# Patient Record
Sex: Male | Born: 1955
Health system: Southern US, Community
[De-identification: ages and names within clinical notes are randomized; demographics above are authoritative.]

## PROBLEM LIST (undated history)

## (undated) DIAGNOSIS — M459 Ankylosing spondylitis of unspecified sites in spine: Secondary | ICD-10-CM

## (undated) DIAGNOSIS — C801 Malignant (primary) neoplasm, unspecified: Secondary | ICD-10-CM

## (undated) DIAGNOSIS — Z87442 Personal history of urinary calculi: Secondary | ICD-10-CM

## (undated) DIAGNOSIS — I509 Heart failure, unspecified: Secondary | ICD-10-CM

## (undated) DIAGNOSIS — I10 Essential (primary) hypertension: Secondary | ICD-10-CM

## (undated) DIAGNOSIS — M1990 Unspecified osteoarthritis, unspecified site: Secondary | ICD-10-CM

## (undated) DIAGNOSIS — I219 Acute myocardial infarction, unspecified: Secondary | ICD-10-CM

## (undated) DIAGNOSIS — I2510 Atherosclerotic heart disease of native coronary artery without angina pectoris: Secondary | ICD-10-CM

## (undated) DIAGNOSIS — Z923 Personal history of irradiation: Secondary | ICD-10-CM

## (undated) DIAGNOSIS — E785 Hyperlipidemia, unspecified: Secondary | ICD-10-CM

## (undated) DIAGNOSIS — F329 Major depressive disorder, single episode, unspecified: Secondary | ICD-10-CM

## (undated) DIAGNOSIS — R011 Cardiac murmur, unspecified: Secondary | ICD-10-CM

## (undated) DIAGNOSIS — F32A Depression, unspecified: Secondary | ICD-10-CM

## (undated) DIAGNOSIS — Z86010 Personal history of colonic polyps: Secondary | ICD-10-CM

## (undated) DIAGNOSIS — Z8719 Personal history of other diseases of the digestive system: Secondary | ICD-10-CM

## (undated) DIAGNOSIS — N400 Enlarged prostate without lower urinary tract symptoms: Secondary | ICD-10-CM

## (undated) HISTORY — DX: Benign prostatic hyperplasia without lower urinary tract symptoms: N40.0

## (undated) HISTORY — DX: Essential (primary) hypertension: I10

## (undated) HISTORY — DX: Personal history of colonic polyps: Z86.010

## (undated) HISTORY — DX: Atherosclerotic heart disease of native coronary artery without angina pectoris: I25.10

## (undated) HISTORY — DX: Personal history of urinary calculi: Z87.442

## (undated) HISTORY — DX: Ankylosing spondylitis of unspecified sites in spine: M45.9

## (undated) HISTORY — PX: TONSILLECTOMY: SUR1361

## (undated) HISTORY — DX: Hyperlipidemia, unspecified: E78.5

## (undated) HISTORY — PX: LAMINECTOMY: SHX219

## (undated) HISTORY — DX: Unspecified osteoarthritis, unspecified site: M19.90

---
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
## 2002-03-16 ENCOUNTER — Emergency Department (HOSPITAL_COMMUNITY): Admission: EM | Admit: 2002-03-16 | Discharge: 2002-03-16 | Payer: Self-pay | Admitting: Emergency Medicine

## 2002-03-19 ENCOUNTER — Emergency Department (HOSPITAL_COMMUNITY): Admission: EM | Admit: 2002-03-19 | Discharge: 2002-03-19 | Payer: Self-pay | Admitting: Emergency Medicine

## 2002-03-21 ENCOUNTER — Emergency Department (HOSPITAL_COMMUNITY): Admission: EM | Admit: 2002-03-21 | Discharge: 2002-03-21 | Payer: Self-pay | Admitting: Emergency Medicine

## 2002-07-13 ENCOUNTER — Encounter: Payer: Self-pay | Admitting: Internal Medicine

## 2002-07-13 ENCOUNTER — Ambulatory Visit (HOSPITAL_COMMUNITY): Admission: RE | Admit: 2002-07-13 | Discharge: 2002-07-13 | Payer: Self-pay | Admitting: Internal Medicine

## 2003-05-14 DIAGNOSIS — I219 Acute myocardial infarction, unspecified: Secondary | ICD-10-CM

## 2003-05-14 HISTORY — DX: Acute myocardial infarction, unspecified: I21.9

## 2003-05-14 HISTORY — PX: CORONARY ANGIOPLASTY WITH STENT PLACEMENT: SHX49

## 2003-07-20 ENCOUNTER — Ambulatory Visit (HOSPITAL_COMMUNITY): Admission: RE | Admit: 2003-07-20 | Discharge: 2003-07-20 | Payer: Self-pay | Admitting: Internal Medicine

## 2004-02-13 ENCOUNTER — Inpatient Hospital Stay (HOSPITAL_COMMUNITY): Admission: AD | Admit: 2004-02-13 | Discharge: 2004-02-15 | Payer: Self-pay | Admitting: Cardiology

## 2004-02-13 ENCOUNTER — Emergency Department (HOSPITAL_COMMUNITY): Admission: EM | Admit: 2004-02-13 | Discharge: 2004-02-13 | Payer: Self-pay | Admitting: Emergency Medicine

## 2004-03-05 ENCOUNTER — Encounter (HOSPITAL_COMMUNITY): Admission: RE | Admit: 2004-03-05 | Discharge: 2004-06-03 | Payer: Self-pay | Admitting: Cardiology

## 2004-03-30 ENCOUNTER — Ambulatory Visit: Payer: Self-pay | Admitting: Cardiology

## 2004-05-24 ENCOUNTER — Ambulatory Visit: Payer: Self-pay | Admitting: Cardiology

## 2004-06-05 ENCOUNTER — Encounter (HOSPITAL_COMMUNITY): Admission: RE | Admit: 2004-06-05 | Discharge: 2004-09-03 | Payer: Self-pay | Admitting: Cardiology

## 2004-08-02 ENCOUNTER — Ambulatory Visit: Payer: Self-pay | Admitting: Gastroenterology

## 2004-08-13 ENCOUNTER — Ambulatory Visit: Payer: Self-pay | Admitting: Gastroenterology

## 2004-10-25 ENCOUNTER — Ambulatory Visit: Payer: Self-pay | Admitting: Cardiology

## 2004-10-26 ENCOUNTER — Observation Stay (HOSPITAL_COMMUNITY): Admission: EM | Admit: 2004-10-26 | Discharge: 2004-10-26 | Payer: Self-pay | Admitting: Emergency Medicine

## 2004-11-01 ENCOUNTER — Ambulatory Visit: Payer: Self-pay

## 2004-12-04 ENCOUNTER — Ambulatory Visit: Payer: Self-pay | Admitting: Cardiology

## 2004-12-31 ENCOUNTER — Encounter: Payer: Self-pay | Admitting: Internal Medicine

## 2004-12-31 ENCOUNTER — Encounter: Admission: RE | Admit: 2004-12-31 | Discharge: 2004-12-31 | Payer: Self-pay | Admitting: Rheumatology

## 2005-01-29 ENCOUNTER — Encounter: Payer: Self-pay | Admitting: Internal Medicine

## 2005-05-13 LAB — HM COLONOSCOPY

## 2005-07-22 ENCOUNTER — Ambulatory Visit (HOSPITAL_COMMUNITY): Admission: RE | Admit: 2005-07-22 | Discharge: 2005-07-22 | Payer: Self-pay | Admitting: Internal Medicine

## 2005-07-30 ENCOUNTER — Ambulatory Visit: Payer: Self-pay | Admitting: Gastroenterology

## 2005-08-06 ENCOUNTER — Ambulatory Visit: Payer: Self-pay | Admitting: Gastroenterology

## 2005-08-06 ENCOUNTER — Encounter (INDEPENDENT_AMBULATORY_CARE_PROVIDER_SITE_OTHER): Payer: Self-pay | Admitting: *Deleted

## 2005-08-06 ENCOUNTER — Ambulatory Visit (HOSPITAL_COMMUNITY): Admission: RE | Admit: 2005-08-06 | Discharge: 2005-08-06 | Payer: Self-pay | Admitting: Gastroenterology

## 2006-04-18 ENCOUNTER — Emergency Department (HOSPITAL_COMMUNITY): Admission: EM | Admit: 2006-04-18 | Discharge: 2006-04-18 | Payer: Self-pay | Admitting: Emergency Medicine

## 2006-06-18 ENCOUNTER — Emergency Department (HOSPITAL_COMMUNITY): Admission: EM | Admit: 2006-06-18 | Discharge: 2006-06-18 | Payer: Self-pay | Admitting: Emergency Medicine

## 2007-05-22 ENCOUNTER — Encounter: Payer: Self-pay | Admitting: Internal Medicine

## 2007-05-22 ENCOUNTER — Encounter: Admission: RE | Admit: 2007-05-22 | Discharge: 2007-05-22 | Payer: Self-pay | Admitting: Rheumatology

## 2007-06-26 ENCOUNTER — Ambulatory Visit (HOSPITAL_COMMUNITY): Admission: RE | Admit: 2007-06-26 | Discharge: 2007-06-27 | Payer: Self-pay | Admitting: Medical Oncology

## 2007-07-24 ENCOUNTER — Encounter: Payer: Self-pay | Admitting: Internal Medicine

## 2008-06-07 ENCOUNTER — Ambulatory Visit: Payer: Self-pay | Admitting: Internal Medicine

## 2008-06-07 DIAGNOSIS — I10 Essential (primary) hypertension: Secondary | ICD-10-CM | POA: Insufficient documentation

## 2008-06-07 DIAGNOSIS — Z87442 Personal history of urinary calculi: Secondary | ICD-10-CM

## 2008-06-07 DIAGNOSIS — Z860100 Personal history of colon polyps, unspecified: Secondary | ICD-10-CM

## 2008-06-07 DIAGNOSIS — E785 Hyperlipidemia, unspecified: Secondary | ICD-10-CM | POA: Insufficient documentation

## 2008-06-07 DIAGNOSIS — N400 Enlarged prostate without lower urinary tract symptoms: Secondary | ICD-10-CM | POA: Insufficient documentation

## 2008-06-07 DIAGNOSIS — I2510 Atherosclerotic heart disease of native coronary artery without angina pectoris: Secondary | ICD-10-CM | POA: Insufficient documentation

## 2008-06-07 HISTORY — DX: Essential (primary) hypertension: I10

## 2008-06-07 HISTORY — DX: Personal history of colon polyps, unspecified: Z86.0100

## 2008-06-07 HISTORY — DX: Hyperlipidemia, unspecified: E78.5

## 2008-06-07 HISTORY — DX: Benign prostatic hyperplasia without lower urinary tract symptoms: N40.0

## 2008-06-07 HISTORY — DX: Personal history of urinary calculi: Z87.442

## 2008-06-07 HISTORY — DX: Personal history of colonic polyps: Z86.010

## 2008-07-26 ENCOUNTER — Encounter: Payer: Self-pay | Admitting: Internal Medicine

## 2008-10-04 ENCOUNTER — Ambulatory Visit: Payer: Self-pay | Admitting: Internal Medicine

## 2008-12-19 ENCOUNTER — Telehealth (INDEPENDENT_AMBULATORY_CARE_PROVIDER_SITE_OTHER): Payer: Self-pay | Admitting: *Deleted

## 2009-01-24 ENCOUNTER — Encounter: Payer: Self-pay | Admitting: Internal Medicine

## 2009-03-13 ENCOUNTER — Ambulatory Visit: Payer: Self-pay | Admitting: Internal Medicine

## 2009-03-13 DIAGNOSIS — F17200 Nicotine dependence, unspecified, uncomplicated: Secondary | ICD-10-CM | POA: Insufficient documentation

## 2009-03-13 LAB — CONVERTED CEMR LAB
ALT: 24 units/L (ref 0–53)
AST: 25 units/L (ref 0–37)
Albumin: 4.4 g/dL (ref 3.5–5.2)
Alkaline Phosphatase: 95 units/L (ref 39–117)
BUN: 15 mg/dL (ref 6–23)
Basophils Relative: 0.9 % (ref 0.0–3.0)
Bilirubin, Direct: 0 mg/dL (ref 0.0–0.3)
CO2: 31 meq/L (ref 19–32)
Calcium: 9.1 mg/dL (ref 8.4–10.5)
Chloride: 105 meq/L (ref 96–112)
Cholesterol: 151 mg/dL (ref 0–200)
Creatinine, Ser: 1 mg/dL (ref 0.4–1.5)
Eosinophils Relative: 1.7 % (ref 0.0–5.0)
GFR calc non Af Amer: 82.31 mL/min (ref >60)
Glucose, Bld: 124 mg/dL — ABNORMAL HIGH (ref 70–99)
HCT: 49.6 % (ref 39.0–52.0)
HDL: 53.3 mg/dL (ref >39.00)
Hemoglobin: 16.7 g/dL (ref 13.0–17.0)
LDL Cholesterol: 84 mg/dL (ref 0–99)
Lymphocytes Relative: 8.4 % — ABNORMAL LOW (ref 12.0–46.0)
MCHC: 33.6 g/dL (ref 30.0–36.0)
MCV: 96 fL (ref 78.0–100.0)
Monocytes Relative: 6.1 % (ref 3.0–12.0)
Neutrophils Relative %: 82.9 % — ABNORMAL HIGH (ref 43.0–77.0)
PSA: 0.72 ng/mL (ref 0.10–4.00)
Platelets: 239 10*3/uL (ref 150.0–400.0)
Potassium: 4.4 meq/L (ref 3.5–5.1)
RBC: 5.17 M/uL (ref 4.22–5.81)
RDW: 13.5 % (ref 11.5–14.6)
Sodium: 141 meq/L (ref 135–145)
TSH: 0.72 microintl units/mL (ref 0.35–5.50)
Total Bilirubin: 0.9 mg/dL (ref 0.3–1.2)
Total CHOL/HDL Ratio: 3
Total Protein: 7.5 g/dL (ref 6.0–8.3)
Triglycerides: 67 mg/dL (ref 0.0–149.0)
VLDL: 13.4 mg/dL (ref 0.0–40.0)
WBC: 13.3 10*3/uL — ABNORMAL HIGH (ref 4.5–10.5)

## 2009-07-19 ENCOUNTER — Encounter: Payer: Self-pay | Admitting: Internal Medicine

## 2009-09-11 ENCOUNTER — Ambulatory Visit: Payer: Self-pay | Admitting: Internal Medicine

## 2010-01-22 ENCOUNTER — Encounter: Payer: Self-pay | Admitting: Internal Medicine

## 2010-03-05 ENCOUNTER — Encounter: Admission: RE | Admit: 2010-03-05 | Discharge: 2010-03-05 | Payer: Self-pay | Admitting: Neurosurgery

## 2010-03-12 ENCOUNTER — Ambulatory Visit: Payer: Self-pay | Admitting: Internal Medicine

## 2010-04-04 ENCOUNTER — Encounter: Payer: Self-pay | Admitting: Internal Medicine

## 2010-06-12 NOTE — Letter (Signed)
Summary: Rheumatology-Dr. Kellie Simmering  Rheumatology-Dr. Kellie Simmering   Imported By: Maryln Gottron 08/03/2008 14:56:47  _____________________________________________________________________  External Attachment:    Type:   Image     Comment:   External Document

## 2010-06-12 NOTE — Letter (Signed)
Summary: Rheumatology-Dr. Stacey Drain  Rheumatology-Dr. Stacey Drain   Imported By: Maryln Gottron 01/30/2010 14:07:44  _____________________________________________________________________  External Attachment:    Type:   Image     Comment:   External Document

## 2010-06-12 NOTE — Letter (Signed)
Summary: Rheumatology-Dr. Kellie Simmering  Rheumatology-Dr. Kellie Simmering   Imported By: Maryln Gottron 08/03/2008 14:53:26  _____________________________________________________________________  External Attachment:    Type:   Image     Comment:   External Document

## 2010-06-12 NOTE — Progress Notes (Signed)
Summary: Insurance will not cover Benicar  Phone Note Call from Patient   Summary of Call: Patient's insurance will not cover Benicar HCT 40-12.5. Preferred drugs are Micardis,Cozaar or Hyzaar. Initial call taken by: Darra Lis RMA,  December 19, 2008 10:21 AM  Follow-up for Phone Call        micardis 80/12.5  #90 one daily  RF 3 Follow-up by: Gordy Savers  MD,  December 19, 2008 11:10 AM  Additional Follow-up for Phone Call Additional follow up Details #1::        Patient informed. Additional Follow-up by: Darra Lis RMA,  December 19, 2008 12:24 PM    New/Updated Medications: MICARDIS HCT 80-12.5 MG TABS (TELMISARTAN-HCTZ) Take 1 tablet by mouth once a day Prescriptions: MICARDIS HCT 80-12.5 MG TABS (TELMISARTAN-HCTZ) Take 1 tablet by mouth once a day  #90 x 3   Entered by:   Darra Lis RMA   Authorized by:   Gordy Savers  MD   Signed by:   Darra Lis RMA on 12/19/2008   Method used:   Electronically to        [facility]  [address] # [id number]* (retail)       [address]       [place], [place]  [address]       Ph: [phone] or [phone]       Fax: [phone]   RxID:   [id number]

## 2010-06-12 NOTE — Letter (Signed)
Summary: [facility]  [facility]   Imported By: Maryln Gottron 04/18/2010 14:04:10  _____________________________________________________________________  External Attachment:    Type:   Image     Comment:   External Document

## 2010-06-12 NOTE — Assessment & Plan Note (Signed)
Summary: 6 month rov/njr   Vital Signs:  Patient profile:   55 year old male Weight:      189 pounds Temp:     98.4 degrees F oral BP sitting:   124 / 80  (right arm) Cuff size:   regular  Vitals Entered By: Duard Brady LPN (Sep 12, 6043 8:01 AM) CC: rov - doing well Is Patient Diabetic? No   CC:  rov - doing well.  History of Present Illness: 55 year old patient who is included for follow-up.  He has clear ardor disease, which has been stable.  He has regular workouts at his gym and denies any exertional symptoms.  Unfortunately, he has resumed smoking after aperitive success with Chand 6.  He has dyslipidemia, controlled on Lipitor, which he continues to tolerate well.  He is treated hypertension.  He also is a history of ankylosing spondylitis, which has been stable.  No new concerns or complaints.  He has colonic polyps and is due for a follow-up colonoscopy in 2010/10/23  Preventive Screening-Counseling & Management  Alcohol-Tobacco     Smoking Cessation Counseling: yes  Allergies (verified): No Known Drug Allergies  Past History:  Past Medical History: Reviewed history from 10/04/2008 and no changes required. Colonic polyps, hx of Coronary artery disease Hyperlipidemia Hypertension ankylosing spondylitis/ iritis cervical disk disease Nephrolithiasis, hx of tobacco use Benign prostatic hypertrophy  Past Surgical History: Reviewed history from 06/07/2008 and no changes required. angioplasty with stenting 2003-10-23 Lumbar laminectomy 10/23/07  Colonoscopy 10/22/2005  Family History: Reviewed history from 06/07/2008 and no changes required. father age 70, history of depression-deceased 10/22/2009 mother died age 69, complications of COPD  Three brothers, one deceased from a suicide death one sister with RA  Social History: Reviewed history from 06/07/2008 and no changes required. Single Current Smoker Regular exercise-yes  Review of Systems  The patient denies  anorexia, fever, weight loss, weight gain, vision loss, decreased hearing, hoarseness, chest pain, syncope, dyspnea on exertion, peripheral edema, prolonged cough, headaches, hemoptysis, abdominal pain, melena, hematochezia, severe indigestion/heartburn, hematuria, incontinence, genital sores, muscle weakness, suspicious skin lesions, transient blindness, difficulty walking, depression, unusual weight change, abnormal bleeding, enlarged lymph nodes, angioedema, breast masses, and testicular masses.    Physical Exam  General:  Well-developed,well-nourished,in no acute distress; alert,appropriate and cooperative throughout examination Head:  Normocephalic and atraumatic without obvious abnormalities. No apparent alopecia or balding. Eyes:  No corneal or conjunctival inflammation noted. EOMI. Perrla. Funduscopic exam benign, without hemorrhages, exudates or papilledema. Vision grossly normal. Mouth:  Oral mucosa and oropharynx without lesions or exudates.  Teeth in good repair. Neck:  No deformities, masses, or tenderness noted. Lungs:  Normal respiratory effort, chest expands symmetrically. Lungs are clear to auscultation, no crackles or wheezes. Heart:  Normal rate and regular rhythm. S1 and S2 normal without gallop, murmur, click, rub or other extra sounds. Abdomen:  Bowel sounds positive,abdomen soft and non-tender without masses, organomegaly or hernias noted. Msk:  No deformity or scoliosis noted of thoracic or lumbar spine.   Pulses:  R and L carotid,radial,femoral,dorsalis pedis and posterior tibial pulses are full and equal bilaterally Extremities:  No clubbing, cyanosis, edema, or deformity noted with normal full range of motion of all joints.     Impression & Recommendations:  Problem # 1:  TOBACCO ABUSE (ICD-305.1)  His updated medication list for this problem includes:    Chantix Starting Month Pak 0.5 Mg X 11 & 1 Mg X 42 Tabs (Varenicline tartrate) .[name]... As directed  Chantix 1 Mg  Tabs (Varenicline tartrate) ..... One twice daily  His updated medication list for this problem includes:    Chantix Starting Month Pak 0.5 Mg X 11 & 1 Mg X 42 Tabs (Varenicline tartrate) .[name]... As directed    Chantix 1 Mg Tabs (Varenicline tartrate) ..... One twice daily  Orders: Prescription Created Electronically [phone])  Problem # 2:  HYPERTENSION (ICD-401.9)  His updated medication list for this problem includes:    Atenolol 50 Mg Tabs (Atenolol) .[name]... 1 once daily    Micardis Hct 80-12.5 Mg Tabs (Telmisartan-hctz) .[name]... Take 1 tablet by mouth once a day  His updated medication list for this problem includes:    Atenolol 50 Mg Tabs (Atenolol) .[name]... 1 once daily    Micardis Hct 80-12.5 Mg Tabs (Telmisartan-hctz) .[name]... Take 1 tablet by mouth once a day  Problem # 3:  HYPERLIPIDEMIA (ICD-272.4)  His updated medication list for this problem includes:    Lipitor 80 Mg Tabs (Atorvastatin calcium) .[name]... 1 once daily  His updated medication list for this problem includes:    Lipitor 80 Mg Tabs (Atorvastatin calcium) .[name]... 1 once daily  Problem # 4:  CORONARY ARTERY DISEASE (ICD-414.00)  His updated medication list for this problem includes:    Plavix 75 Mg Tabs (Clopidogrel bisulfate) .[name]... 1 once daily    Atenolol 50 Mg Tabs (Atenolol) .[name]... 1 once daily    Micardis Hct 80-12.5 Mg Tabs (Telmisartan-hctz) .[name]... Take 1 tablet by mouth once a day  His updated medication list for this problem includes:    Plavix 75 Mg Tabs (Clopidogrel bisulfate) .[name]... 1 once daily    Atenolol 50 Mg Tabs (Atenolol) .[name]... 1 once daily    Micardis Hct 80-12.5 Mg Tabs (Telmisartan-hctz) .[name]... Take 1 tablet by mouth once a day  Complete Medication List: 1)  Lipitor 80 Mg Tabs (Atorvastatin calcium) .[name].. 1 once daily 2)  Plavix 75 Mg Tabs (Clopidogrel bisulfate) .[name].. 1 once daily 3)  Flomax 0.4 Mg Xr24h-cap (Tamsulosin hcl) .[name].. 1 once daily 4)  Atenolol 50 Mg Tabs (Atenolol) .[name].. 1 once daily 5)   Enbrel 50 Mg/ml Soln (Etanercept) .... Q week 6)  Chantix Starting Month Pak 0.5 Mg X 11 & 1 Mg X 42 Tabs (Varenicline tartrate) .... As directed 7)  Chantix 1 Mg Tabs (Varenicline tartrate) .... One twice daily 8)  Viagra 50 Mg Tabs (Sildenafil citrate) .... As directed 9)  Micardis Hct 80-12.5 Mg Tabs (Telmisartan-hctz) .... Take 1 tablet by mouth once a day  Patient Instructions: 1)  Please schedule a follow-up appointment in 6 months. 2)  Limit your Sodium (Salt) to less than 2 grams a day(slightly less than 1/2 a teaspoon) to prevent fluid retention, swelling, or worsening of symptoms. 3)  Tobacco is very bad for your health and your loved ones! You Should stop smoking!. 4)  It is important that you exercise regularly at least 20 minutes 5 times a week. If you develop chest pain, have severe difficulty breathing, or feel very tired , stop exercising immediately and seek medical attention. 5)  Check your Blood Pressure regularly. If it is above: 150/90  you should make an appointment. Prescriptions: CHANTIX 1 MG TABS (VARENICLINE TARTRATE) one twice daily  #60 x 2   Entered and Authorized by:   Gordy Savers  MD   Signed by:   Gordy Savers  MD on 09/11/2009   Method used:   Electronically to  [facility]  [address] # [id number]* (retail)       [address]       [place], [place]  [address]       Ph: [phone] or [phone]       Fax: [phone]   RxID:   [id number]

## 2010-06-12 NOTE — Letter (Signed)
Summary: Rheumatology-Dr. Stacey Drain  Rheumatology-Dr. Stacey Drain   Imported By: Maryln Gottron 07/26/2009 12:59:19  _____________________________________________________________________  External Attachment:    Type:   Image     Comment:   External Document

## 2010-06-12 NOTE — Assessment & Plan Note (Signed)
Summary: 6 MNTH ROV//SLM   Vital Signs:  Patient profile:   55 year old male Weight:      193 pounds BMI:     26.27 Temp:     98.1 degrees F oral BP sitting:   122 / 80  (left arm) Cuff size:   regular  Vitals Entered By: Alfred Levins, CMA (March 12, 2010 8:00 AM) CC: f/u, not fasting   CC:  f/u and not fasting.  History of Present Illness: 55 -year-old patient who is in today for follow-up.He has a history of coronary artery disease, hypertension, and dyslipidemia.  He has a history of ongoing tobacco use.  He has tried Chantix which was not successful, and caused vivid dreams.  His cardiac status is stable.  He continues to go to the gym 4 to 5 times per week. At the present time.  He has seen rheumatology as well as orthopedics for right hip pain.  When he exercises he has some paresthesias down to the right foot.  He is scheduled for epidurals in the near future.  He also has a history of colonic polyps. He wishes to consider  other pharmacologic considerations to assist with his tobacco addiction  Preventive Screening-Counseling & Management  Alcohol-Tobacco     Smoking Cessation Counseling: yes  Current Medications (verified): 1)  Lipitor 80 Mg Tabs (Atorvastatin Calcium) .[name].. 1 Once Daily 2)  Plavix 75 Mg Tabs (Clopidogrel Bisulfate) .[name].. 1 Once Daily 3)  Flomax 0.4 Mg Xr24h-Cap (Tamsulosin Hcl) .[name].. 1 Once Daily 4)  Atenolol 50 Mg Tabs (Atenolol) .[name].. 1 Once Daily 5)  Enbrel 50 Mg/ml Soln (Etanercept) .... Q Week 6)  Viagra 50 Mg Tabs (Sildenafil Citrate) .... As Directed 7)  Micardis Hct 80-12.5 Mg Tabs (Telmisartan-Hctz) .... Take 1 Tablet By Mouth Once A Day  Allergies (verified): No Known Drug Allergies  Past History:  Past Medical History: Reviewed history from 10/04/2008 and no changes required. Colonic polyps, hx of Coronary artery disease Hyperlipidemia Hypertension ankylosing spondylitis/ iritis cervical disk disease Nephrolithiasis, hx  of tobacco use Benign prostatic hypertrophy  Past Surgical History: Reviewed history from 06/07/2008 and no changes required. angioplasty with stenting 2005 Lumbar laminectomy 2009  Colonoscopy 2007  Review of Systems  The patient denies anorexia, fever, weight loss, weight gain, vision loss, decreased hearing, hoarseness, chest pain, syncope, dyspnea on exertion, peripheral edema, prolonged cough, headaches, hemoptysis, abdominal pain, melena, hematochezia, severe indigestion/heartburn, hematuria, incontinence, genital sores, muscle weakness, suspicious skin lesions, transient blindness, difficulty walking, depression, unusual weight change, abnormal bleeding, enlarged lymph nodes, angioedema, breast masses, and testicular masses.    Physical Exam  General:  Well-developed,well-nourished,in no acute distress; alert,appropriate and cooperative throughout examination Head:  Normocephalic and atraumatic without obvious abnormalities. No apparent alopecia or balding. Eyes:  No corneal or conjunctival inflammation noted. EOMI. Perrla. Funduscopic exam benign, without hemorrhages, exudates or papilledema. Vision grossly normal. Mouth:  Oral mucosa and oropharynx without lesions or exudates.  Teeth in good repair. Neck:  No deformities, masses, or tenderness noted. Lungs:  Normal respiratory effort, chest expands symmetrically. Lungs are clear to auscultation, no crackles or wheezes. Heart:  Normal rate and regular rhythm. S1 and S2 normal without gallop, murmur, click, rub or other extra sounds. Abdomen:  Bowel sounds positive,abdomen soft and non-tender without masses, organomegaly or hernias noted. Msk:  No deformity or scoliosis noted of thoracic or lumbar spine.   Pulses:  R and L carotid,radial,femoral,dorsalis pedis and posterior tibial pulses are full and  equal bilaterally Extremities:  No clubbing, cyanosis, edema, or deformity noted with normal full range of motion of all joints.     Skin:  Intact without suspicious lesions or rashes Cervical Nodes:  No lymphadenopathy noted Axillary Nodes:  No palpable lymphadenopathy   Impression & Recommendations:  Problem # 1:  TOBACCO ABUSE (ICD-305.1)  The following medications were removed from the medication list:    Chantix Starting Month Pak 0.5 Mg X 11 & 1 Mg X 42 Tabs (Varenicline tartrate) .[name]... As directed    Chantix 1 Mg Tabs (Varenicline tartrate) ..... One twice daily  Problem # 2:  HYPERTENSION (ICD-401.9)  His updated medication list for this problem includes:    Atenolol 50 Mg Tabs (Atenolol) .[name]... 1 once daily    Micardis Hct 80-12.5 Mg Tabs (Telmisartan-hctz) .[name]... Take 1 tablet by mouth once a day  Problem # 3:  HYPERLIPIDEMIA (ICD-272.4)  His updated medication list for this problem includes:    Lipitor 80 Mg Tabs (Atorvastatin calcium) .[name]... 1 once daily  Problem # 4:  CORONARY ARTERY DISEASE (ICD-414.00)  His updated medication list for this problem includes:    Plavix 75 Mg Tabs (Clopidogrel bisulfate) .[name]... 1 once daily    Atenolol 50 Mg Tabs (Atenolol) .[name]... 1 once daily    Micardis Hct 80-12.5 Mg Tabs (Telmisartan-hctz) .[name]... Take 1 tablet by mouth once a day  Complete Medication List: 1)  Lipitor 80 Mg Tabs (Atorvastatin calcium) .[name].. 1 once daily 2)  Plavix 75 Mg Tabs (Clopidogrel bisulfate) .[name].. 1 once daily 3)  Flomax 0.4 Mg Xr24h-cap (Tamsulosin hcl) .[name].. 1 once daily 4)  Atenolol 50 Mg Tabs (Atenolol) .[name].. 1 once daily 5)  Enbrel 50 Mg/ml Soln (Etanercept) .... Q week 6)  Viagra 50 Mg Tabs (Sildenafil citrate) .... As directed 7)  Micardis Hct 80-12.5 Mg Tabs (Telmisartan-hctz) .... Take 1 tablet by mouth once a day 8)  Bupropion Hcl 150 Mg Xr12h-tab (Bupropion hcl) .... One twice daily  Patient Instructions: 1)  Please schedule a follow-up appointment in 6 months. 2)  Limit your Sodium (Salt). 3)  Tobacco is very bad for your health and your loved ones! You Should stop  smoking!. 4)  It is important that you exercise regularly at least 20 minutes 5 times a week. If you develop chest pain, have severe difficulty breathing, or feel very tired , stop exercising immediately and seek medical attention. Prescriptions: BUPROPION HCL 150 MG XR12H-TAB (BUPROPION HCL) one twice daily  #60 x 2   Entered and Authorized by:   Gordy Savers  MD   Signed by:   Gordy Savers  MD on 03/12/2010   Method used:   Electronically to        [facility]  [address] # [id number]* (retail)       [address]       [place], [place]  [address]       Ph: [phone] or [phone]       Fax: [phone]   RxID:   [id number] MICARDIS HCT 80-12.5 MG TABS (TELMISARTAN-HCTZ) Take 1 tablet by mouth once a day  #90 x 3   Entered and Authorized by:   Gordy Savers  MD   Signed by:   Gordy Savers  MD on 03/12/2010   Method used:   Electronically to        [facility]  [address] # [id number]* (retail)  [address]       [place], [place]  [address]       Ph: [phone] or [phone]       Fax: [phone]   RxID:   [id number] VIAGRA 50 MG TABS (SILDENAFIL CITRATE) as directed  #12 x 12   Entered and Authorized by:   Gordy Savers  MD   Signed by:   Gordy Savers  MD on 03/12/2010   Method used:   Electronically to        [facility]  [address] # [id number]* (retail)       [address]       [place], [place]  [address]       Ph: [phone] or [phone]       Fax: [phone]   RxID:   [id number] ATENOLOL 50 MG TABS (ATENOLOL) 1 once daily  #90 x 4   Entered and Authorized by:   Gordy Savers  MD   Signed by:   Gordy Savers  MD on 03/12/2010   Method used:   Electronically to        [facility]  [address] # [id number]* (retail)       [address]       [place], [place]  [address]       Ph: [phone] or [phone]       Fax: [phone]    RxID:   [id number] LIPITOR 80 MG TABS (ATORVASTATIN CALCIUM) 1 once daily  #90 x 4   Entered and Authorized by:   Gordy Savers  MD   Signed by:   Gordy Savers  MD on 03/12/2010   Method used:   Electronically to        [facility]  [address] # [id number]* (retail)       [address]       [place], [place]  [address]       Ph: [phone] or [phone]       Fax: [phone]   RxID:   [id number] FLOMAX 0.4 MG XR24H-CAP (TAMSULOSIN HCL) 1 once daily  #90 x 4   Entered and Authorized by:   Gordy Savers  MD   Signed by:   Gordy Savers  MD on 03/12/2010   Method used:   Electronically to        [facility]  [address] # [id number]* (retail)       [address]       [place], [place]  [address]       Ph: [phone] or [phone]       Fax: [phone]   RxID:   [id number] PLAVIX 75 MG TABS (CLOPIDOGREL BISULFATE) 1 once daily  #90 x 4   Entered and Authorized by:   Gordy Savers  MD   Signed by:   Gordy Savers  MD on 03/12/2010   Method used:   Electronically to        [facility]  [address] # [id number]* (retail)       [address]       [place], [place]  [address]       Ph: [phone] or [phone]  Fax: [phone]   RxID:   [id number]  [id number]  Orders Added: 1)  Est. Patient Level IV [[address]]

## 2010-06-12 NOTE — Letter (Signed)
Summary: Rheumatology-Dr. Kellie Simmering  Rheumatology-Dr. Kellie Simmering   Imported By: Maryln Gottron 08/03/2008 14:58:21  _____________________________________________________________________  External Attachment:    Type:   Image     Comment:   External Document

## 2010-06-12 NOTE — Assessment & Plan Note (Signed)
Summary: 6 month rov/pt will come in fasting/njr   Vital Signs:  Patient profile:   55 year old male Weight:      192 pounds Temp:     99.2 degrees F oral BP sitting:   146 / 90  (left arm) Cuff size:   regular  Vitals Entered By: Raechel Ache, RN (March 13, 2009 8:05 AM) CC: 6 mo ROV. Is Patient Diabetic? No   CC:  6 mo ROV.[name]  History of Present Illness: 55 year old patient who is in today for follow-up.he has a history of coronary artery disease, which has been stable.  He denies any exertional chest pain.  His only complaint is right calf pain and muscle spasm.  This occurs mainly at night and he denies any true claudication. Unfortunately, continues to smoke.  He does have Chantix but has not yet taken the medication. He is treated hypertension and dyslipidemia.  He is fasting today and for screening lab.  He has history of BPH on Flomax, which has been stable.  He also has nephrolithiasis, which has been stable. He is followed by rheumatology for ankylosing spondylitis.  He continues that had decreased range of motion of the spine, but is pain-free.  Preventive Screening-Counseling & Management  Alcohol-Tobacco     Smoking Status: current     Smoking Cessation Counseling: yes  Allergies: No Known Drug Allergies  Past History:  Past Medical History: Reviewed history from 10/04/2008 and no changes required. Colonic polyps, hx of Coronary artery disease Hyperlipidemia Hypertension ankylosing spondylitis/ iritis cervical disk disease Nephrolithiasis, hx of tobacco use Benign prostatic hypertrophy  Past Surgical History: Reviewed history from 06/07/2008 and no changes required. angioplasty with stenting 2005 Lumbar laminectomy 2009  Colonoscopy 2007  Family History: Reviewed history from 06/07/2008 and no changes required. father age 47, history of depression mother died age 5, complications of COPD  Three brothers, one deceased from a suicide death  one sister with RA  Review of Systems  The patient denies anorexia, fever, weight loss, weight gain, vision loss, decreased hearing, hoarseness, chest pain, syncope, dyspnea on exertion, peripheral edema, prolonged cough, headaches, hemoptysis, abdominal pain, melena, hematochezia, severe indigestion/heartburn, hematuria, incontinence, genital sores, muscle weakness, suspicious skin lesions, transient blindness, difficulty walking, depression, unusual weight change, abnormal bleeding, enlarged lymph nodes, angioedema, breast masses, and testicular masses.    Physical Exam  General:  Well-developed,well-nourished,in no acute distress; alert,appropriate and cooperative throughout examination; repeat blood pressure 120/84 Head:  Normocephalic and atraumatic without obvious abnormalities. No apparent alopecia or balding. Eyes:  No corneal or conjunctival inflammation noted. EOMI. Perrla. Funduscopic exam benign, without hemorrhages, exudates or papilledema. Vision grossly normal. Mouth:  Oral mucosa and oropharynx without lesions or exudates.  Teeth in good repair. Neck:  No deformities, masses, or tenderness noted. decreased range of motion Lungs:  Normal respiratory effort, chest expands symmetrically. Lungs are clear to auscultation, no crackles or wheezes. Heart:  Normal rate and regular rhythm. S1 and S2 normal without gallop, murmur, click, rub or other extra sounds. Abdomen:  Bowel sounds positive,abdomen soft and non-tender without masses, organomegaly or hernias noted. Msk:  No deformity or scoliosis noted of thoracic or lumbar spine.   Pulses:  R and L carotid,radial,femoral,dorsalis pedis and posterior tibial pulses are full and equal bilaterally Extremities:  No clubbing, cyanosis, edema, or deformity noted with normal full range of motion of all joints.   Skin:  Intact without suspicious lesions or rashes Cervical Nodes:  No lymphadenopathy noted Psych:  Cognition and judgment appear  intact. Alert and cooperative with normal attention span and concentration. No apparent delusions, illusions, hallucinations   Impression & Recommendations:  Problem # 1:  BENIGN PROSTATIC HYPERTROPHY (ICD-600.00)  His updated medication list for this problem includes:    Flomax 0.4 Mg Xr24h-cap (Tamsulosin hcl) .[name]... 1 once daily    His updated medication list for this problem includes:    Flomax 0.4 Mg Xr24h-cap (Tamsulosin hcl) .[name]... 1 once daily  Orders: TLB-PSA (Prostate Specific Antigen) (84153-PSA)  Problem # 2:  HYPERTENSION (ICD-401.9)  The following medications were removed from the medication list:    Benicar Hct 40-12.5 Mg Tabs (Olmesartan medoxomil-hctz) .[name]... 1 once daily His updated medication list for this problem includes:    Atenolol 50 Mg Tabs (Atenolol) .[name]... 1 once daily    Micardis Hct 80-12.5 Mg Tabs (Telmisartan-hctz) .[name]... Take 1 tablet by mouth once a day    The following medications were removed from the medication list:    Benicar Hct 40-12.5 Mg Tabs (Olmesartan medoxomil-hctz) .[name]... 1 once daily His updated medication list for this problem includes:    Atenolol 50 Mg Tabs (Atenolol) .[name]... 1 once daily    Micardis Hct 80-12.5 Mg Tabs (Telmisartan-hctz) .[name]... Take 1 tablet by mouth once a day  Orders: TLB-BMP (Basic Metabolic Panel-BMET) (80048-METABOL) TLB-CBC Platelet - w/Differential (85025-CBCD) TLB-Hepatic/Liver Function Pnl (80076-HEPATIC) TLB-TSH (Thyroid Stimulating Hormone) (84443-TSH)  Problem # 3:  CORONARY ARTERY DISEASE (ICD-414.00)  The following medications were removed from the medication list:    Benicar Hct 40-12.5 Mg Tabs (Olmesartan medoxomil-hctz) .[name]... 1 once daily His updated medication list for this problem includes:    Plavix 75 Mg Tabs (Clopidogrel bisulfate) .[name]... 1 once daily    Atenolol 50 Mg Tabs (Atenolol) .[name]... 1 once daily    Micardis Hct 80-12.5 Mg Tabs (Telmisartan-hctz) .[name]... Take 1 tablet by mouth once a  day    The following medications were removed from the medication list:    Benicar Hct 40-12.5 Mg Tabs (Olmesartan medoxomil-hctz) .[name]... 1 once daily His updated medication list for this problem includes:    Plavix 75 Mg Tabs (Clopidogrel bisulfate) .[name]... 1 once daily    Atenolol 50 Mg Tabs (Atenolol) .[name]... 1 once daily    Micardis Hct 80-12.5 Mg Tabs (Telmisartan-hctz) .[name]... Take 1 tablet by mouth once a day  Orders: Prescription Created Electronically [phone])  Problem # 4:  HYPERLIPIDEMIA (ICD-272.4)  His updated medication list for this problem includes:    Lipitor 80 Mg Tabs (Atorvastatin calcium) .[name]... 1 once daily    His updated medication list for this problem includes:    Lipitor 80 Mg Tabs (Atorvastatin calcium) .[name]... 1 once daily  Orders: Venipuncture (95621) TLB-Lipid Panel (80061-LIPID) TLB-BMP (Basic Metabolic Panel-BMET) (80048-METABOL) TLB-CBC Platelet - w/Differential (85025-CBCD) TLB-Hepatic/Liver Function Pnl (80076-HEPATIC) TLB-TSH (Thyroid Stimulating Hormone) (84443-TSH)  Problem # 5:  TOBACCO ABUSE (ICD-305.1)  His updated medication list for this problem includes:    Chantix Starting Month Pak 0.5 Mg X 11 & 1 Mg X 42 Tabs (Varenicline tartrate) .[name]... As directed    Chantix 1 Mg Tabs (Varenicline tartrate) ..... One twice daily cessation of tobacco discussed  His updated medication list for this problem includes:    Chantix Starting Month Pak 0.5 Mg X 11 & 1 Mg X 42 Tabs (Varenicline tartrate) .[name]... As directed    Chantix 1 Mg Tabs (Varenicline tartrate) ..... One twice daily  Complete Medication List: 1)  Lipitor 80 Mg Tabs (Atorvastatin calcium) .Marland KitchenMarland KitchenMarland Kitchen  1 once daily 2)  Plavix 75 Mg Tabs (Clopidogrel bisulfate) .[name].. 1 once daily 3)  Flomax 0.4 Mg Xr24h-cap (Tamsulosin hcl) .[name].. 1 once daily 4)  Atenolol 50 Mg Tabs (Atenolol) .[name].. 1 once daily 5)  Enbrel 50 Mg/ml Soln (Etanercept) .... Q week 6)  Chantix Starting Month Pak 0.5 Mg X 11 & 1 Mg X 42  Tabs (Varenicline tartrate) .... As directed 7)  Chantix 1 Mg Tabs (Varenicline tartrate) .... One twice daily 8)  Viagra 50 Mg Tabs (Sildenafil citrate) .... As directed 9)  Micardis Hct 80-12.5 Mg Tabs (Telmisartan-hctz) .... Take 1 tablet by mouth once a day  Patient Instructions: 1)  Please schedule a follow-up appointment in 6 months. 2)  Limit your Sodium (Salt). 3)  Tobacco is very bad for your health and your loved ones! You Should stop smoking!. 4)  It is important that you exercise regularly at least 20 minutes 5 times a week. If you develop chest pain, have severe difficulty breathing, or feel very tired , stop exercising immediately and seek medical attention. 5)  Check your Blood Pressure regularly. If it is above: 140/90 you should make an appointment. Prescriptions: MICARDIS HCT 80-12.5 MG TABS (TELMISARTAN-HCTZ) Take 1 tablet by mouth once a day  #90 x 3   Entered and Authorized by:   Gordy Savers  MD   Signed by:   Gordy Savers  MD on 03/13/2009   Method used:   Electronically to        [facility]  [address] [id number]* (retail)       [address]       [place], [place]  [address]       Ph: [phone] or [phone]       Fax: [phone]   RxID:   [id number] VIAGRA 50 MG TABS (SILDENAFIL CITRATE) as directed  #12 x 12   Entered and Authorized by:   Gordy Savers  MD   Signed by:   Gordy Savers  MD on 03/13/2009   Method used:   Electronically to        [facility]  [address] [id number]* (retail)       [address]       [place], [place]  [address]       Ph: [phone] or [phone]       Fax: [phone]   RxID:   [id number] ATENOLOL 50 MG TABS (ATENOLOL) 1 once daily  #90 x 4   Entered and Authorized by:   Gordy Savers  MD   Signed by:   Gordy Savers  MD on 03/13/2009   Method used:   Electronically to        [facility]  [address] [id number]* (retail)       [address]       [place], [place]  [address]       Ph: [phone] or [phone]       Fax: [phone]   RxID:   [id number] FLOMAX 0.4 MG XR24H-CAP (TAMSULOSIN HCL) 1 once daily  #90 x 4   Entered and Authorized by:   Gordy Savers  MD   Signed by:   Gordy Savers  MD on 03/13/2009   Method used:   Electronically to        [facility]  [address] [facility] (retail)       [address]       [place], [place]  [address]       Ph: [phone] or [phone]       Fax: [phone]   RxID:   [id number] PLAVIX 75 MG TABS (CLOPIDOGREL BISULFATE) 1 once daily  #90 x 4   Entered and Authorized by:   Gordy Savers  MD   Signed by:   Gordy Savers  MD on 03/13/2009   Method used:   Electronically to        [facility]  [address] [id number]* (retail)       [address]       [place], [place]  [address]       Ph: [phone] or [phone]       Fax: [phone]   RxID:   [id number] LIPITOR 80 MG TABS (ATORVASTATIN CALCIUM) 1 once daily  #90 x 4   Entered and Authorized by:   Gordy Savers  MD   Signed by:   Gordy Savers  MD on 03/13/2009   Method used:   Electronically to        [facility]  [address] [id number]* (retail)       [address]       [place], [place]  [address]       Ph: [phone] or [phone]       Fax: [phone]   RxID:   [id number]

## 2010-06-12 NOTE — Letter (Signed)
Summary: Rheumatology-Dr. Kellie Simmering  Rheumatology-Dr. Kellie Simmering   Imported By: Maryln Gottron 02/02/2009 15:21:35  _____________________________________________________________________  External Attachment:    Type:   Image     Comment:   External Document

## 2010-08-29 ENCOUNTER — Other Ambulatory Visit: Payer: Self-pay | Admitting: Internal Medicine

## 2010-09-04 ENCOUNTER — Other Ambulatory Visit (INDEPENDENT_AMBULATORY_CARE_PROVIDER_SITE_OTHER): Payer: BC Managed Care – PPO | Admitting: Internal Medicine

## 2010-09-04 DIAGNOSIS — Z0000 Encounter for general adult medical examination without abnormal findings: Secondary | ICD-10-CM

## 2010-09-04 LAB — POCT URINALYSIS DIPSTICK
Bilirubin, UA: NEGATIVE
Blood, UA: NEGATIVE
Glucose, UA: NEGATIVE
Ketones, UA: NEGATIVE
Leukocytes, UA: NEGATIVE
Nitrite, UA: NEGATIVE
Protein, UA: NEGATIVE
Spec Grav, UA: 1.01
Urobilinogen, UA: 0.2
pH, UA: 6

## 2010-09-04 LAB — BASIC METABOLIC PANEL
BUN: 12 mg/dL (ref 6–23)
CO2: 30 mEq/L (ref 19–32)
Calcium: 9.4 mg/dL (ref 8.4–10.5)
Chloride: 101 mEq/L (ref 96–112)
Creatinine, Ser: 0.9 mg/dL (ref 0.4–1.5)
GFR: 91.28 mL/min (ref >60.00)
Glucose, Bld: 102 mg/dL — ABNORMAL HIGH (ref 70–99)
Potassium: 4.2 mEq/L (ref 3.5–5.1)
Sodium: 138 mEq/L (ref 135–145)

## 2010-09-04 LAB — CBC WITH DIFFERENTIAL/PLATELET
Basophils Absolute: 0 10*3/uL (ref 0.0–0.1)
Basophils Relative: 0.4 % (ref 0.0–3.0)
Eosinophils Absolute: 0.3 10*3/uL (ref 0.0–0.7)
Eosinophils Relative: 3.2 % (ref 0.0–5.0)
HCT: 44.8 % (ref 39.0–52.0)
Hemoglobin: 15.5 g/dL (ref 13.0–17.0)
Lymphocytes Relative: 16.8 % (ref 12.0–46.0)
Lymphs Abs: 1.4 10*3/uL (ref 0.7–4.0)
MCHC: 34.5 g/dL (ref 30.0–36.0)
MCV: 93 fl (ref 78.0–100.0)
Monocytes Absolute: 0.7 10*3/uL (ref 0.1–1.0)
Monocytes Relative: 8.5 % (ref 3.0–12.0)
Neutro Abs: 5.8 10*3/uL (ref 1.4–7.7)
Neutrophils Relative %: 71.1 % (ref 43.0–77.0)
Platelets: 197 10*3/uL (ref 150.0–400.0)
RBC: 4.82 Mil/uL (ref 4.22–5.81)
RDW: 13.8 % (ref 11.5–14.6)
WBC: 8.1 10*3/uL (ref 4.5–10.5)

## 2010-09-04 LAB — HEPATIC FUNCTION PANEL
ALT: 25 U/L (ref 0–53)
AST: 24 U/L (ref 0–37)
Albumin: 4.1 g/dL (ref 3.5–5.2)
Alkaline Phosphatase: 94 U/L (ref 39–117)
Bilirubin, Direct: 0.1 mg/dL (ref 0.0–0.3)
Total Bilirubin: 0.9 mg/dL (ref 0.3–1.2)
Total Protein: 6.8 g/dL (ref 6.0–8.3)

## 2010-09-04 LAB — LIPID PANEL
Cholesterol: 151 mg/dL (ref 0–200)
HDL: 48.8 mg/dL (ref >39.00)
LDL Cholesterol: 89 mg/dL (ref 0–99)
Total CHOL/HDL Ratio: 3
Triglycerides: 65 mg/dL (ref 0.0–149.0)
VLDL: 13 mg/dL (ref 0.0–40.0)

## 2010-09-04 LAB — TSH: TSH: 0.91 u[IU]/mL (ref 0.35–5.50)

## 2010-09-04 LAB — PSA: PSA: 0.53 ng/mL (ref 0.10–4.00)

## 2010-09-10 ENCOUNTER — Encounter: Payer: Self-pay | Admitting: Internal Medicine

## 2010-09-11 ENCOUNTER — Ambulatory Visit (INDEPENDENT_AMBULATORY_CARE_PROVIDER_SITE_OTHER): Payer: BC Managed Care – PPO | Admitting: Internal Medicine

## 2010-09-11 ENCOUNTER — Encounter: Payer: Self-pay | Admitting: Internal Medicine

## 2010-09-11 DIAGNOSIS — F17200 Nicotine dependence, unspecified, uncomplicated: Secondary | ICD-10-CM

## 2010-09-11 DIAGNOSIS — E785 Hyperlipidemia, unspecified: Secondary | ICD-10-CM

## 2010-09-11 DIAGNOSIS — I10 Essential (primary) hypertension: Secondary | ICD-10-CM

## 2010-09-11 DIAGNOSIS — Z0000 Encounter for general adult medical examination without abnormal findings: Secondary | ICD-10-CM

## 2010-09-11 MED ORDER — CLOPIDOGREL BISULFATE 75 MG PO TABS: 75.0000 mg | ORAL_TABLET | Freq: Every day | ORAL | Status: DC

## 2010-09-11 MED ORDER — ATENOLOL 50 MG PO TABS: 50.0000 mg | ORAL_TABLET | Freq: Every day | ORAL | Status: DC

## 2010-09-11 MED ORDER — SILDENAFIL CITRATE 50 MG PO TABS: 50.0000 mg | ORAL_TABLET | Freq: Every day | ORAL | Status: DC | PRN

## 2010-09-11 MED ORDER — BUPROPION HCL ER (SR) 150 MG PO TB12: 150.0000 mg | ORAL_TABLET | Freq: Two times a day (BID) | ORAL | Status: DC

## 2010-09-11 MED ORDER — TELMISARTAN-HCTZ 80-12.5 MG PO TABS: 1.0000 | ORAL_TABLET | Freq: Every day | ORAL | Status: DC

## 2010-09-11 MED ORDER — ATORVASTATIN CALCIUM 80 MG PO TABS: 80.0000 mg | ORAL_TABLET | Freq: Every day | ORAL | Status: DC

## 2010-09-11 MED ORDER — TAMSULOSIN HCL 0.4 MG PO CAPS: 0.4000 mg | ORAL_CAPSULE | Freq: Every day | ORAL | Status: DC

## 2010-09-11 NOTE — Progress Notes (Signed)
Subjective:    Patient ID: Brandon [name], male    DOB: 02/21/1956, 55 y.o.   MRN: [id number]  HPI   55 year old patient who is seen today for followup and annual exam.  He has a history of coronary artery disease which has been quite stable. He remains quite active physically and gets to the health club approximately 5 times per week. He has no exertional symptoms. He has a history of ongoing tobacco use but has tapered his cigarette consumption down to 5 or 6 cigarettes daily. No concerns or complaints He has ankylosing spondylitis and is followed by rheumatology and has done quite well in this respect He has a history also of colonic polyps and his last colonoscopy was in 2007. He is due for followup colonoscopy     Summary: NEW PT TO ESTABLISH/JLS  Vital Signs:  Patient Profile: 55 Years Old Male  Height: 72 inches  Weight: 195 pounds  Temp: 98.7 degrees F oral  Pulse rate: 80 / minute  Pulse rhythm: regular  BP sitting: 136 / 84 (left arm)  Cuff size: regular  Vitals Entered By: Raechel Ache, RN (June 07, 2008 9:45 AM)  Chief Complaint: Needs PCP.[name]  History of Present Illness:  55 year old gentleman seen today to establish with our practice. Medical problems include coronary artery disease. He was admitted to the hospital and 2005 for acute MI. That was treated with the emergent angioplasty and stenting. He has dyslipidemia, hypertension, and a long history of ankylosing spondylitis. He is followed by Dr. Harriet Masson low  Current Allergies:  No known allergies  Past Medical History:  Reviewed history and no changes required:  Colonic polyps, hx of  Coronary artery disease  Hyperlipidemia  Hypertension  ankylosing spondylitis  cervical disk disease  Nephrolithiasis, hx of  tobacco use  Benign prostatic hypertrophy  Past Surgical History:  Reviewed history and no changes required:  angioplasty with stenting 2005  Lumbar laminectomy 2009  Colonoscopy 2007  Family History:    Reviewed history and no changes required:  father age 86, history of depression  mother died age 10, complications of COPD  Three brothers, one deceased from a suicide death  one sister with RA  Social History:  Single  Current Smoker  Regular exercise-yes  Risk Factors:  Tobacco use: current  Counseled to quit/cut down tobacco use: yes  Exercise: yes    Review of Systems  Constitutional: Negative for fever, chills, activity change, appetite change and fatigue.  HENT: Negative for hearing loss, ear pain, congestion, rhinorrhea, sneezing, mouth sores, trouble swallowing, neck pain, neck stiffness, dental problem, voice change, sinus pressure and tinnitus.   Eyes: Negative for photophobia, pain, redness and visual disturbance.  Respiratory: Negative for apnea, cough, choking, chest tightness, shortness of breath and wheezing.   Cardiovascular: Negative for chest pain, palpitations and leg swelling.  Gastrointestinal: Negative for nausea, vomiting, abdominal pain, diarrhea, constipation, blood in stool, abdominal distention, anal bleeding and rectal pain.  Genitourinary: Negative for dysuria, urgency, frequency, hematuria, flank pain, decreased urine volume, discharge, penile swelling, scrotal swelling, difficulty urinating, genital sores and testicular pain.  Musculoskeletal: Positive for back pain. Negative for myalgias, joint swelling, arthralgias and gait problem.  Skin: Negative for color change, rash and wound.  Neurological: Negative for dizziness, tremors, seizures, syncope, facial asymmetry, speech difficulty, weakness, light-headedness, numbness and headaches.  Hematological: Negative for adenopathy. Does not bruise/bleed easily.  Psychiatric/Behavioral: Negative for suicidal ideas, hallucinations, behavioral problems, confusion, sleep disturbance, self-injury, dysphoric mood, decreased  concentration and agitation. The patient is not nervous/anxious.        Objective:    Physical Exam  Constitutional: He appears well-developed and well-nourished.  HENT:  Head: Normocephalic and atraumatic.  Right Ear: External ear normal.  Left Ear: External ear normal.  Nose: Nose normal.  Mouth/Throat: Oropharynx is clear and moist.  Eyes: Conjunctivae and EOM are normal. Pupils are equal, round, and reactive to light. No scleral icterus.  Neck: Normal range of motion. Neck supple. No JVD present. No thyromegaly present.  Cardiovascular: Regular rhythm, normal heart sounds and intact distal pulses.  Exam reveals no gallop and no friction rub.   No murmur heard. Pulmonary/Chest: Effort normal and breath sounds normal. He exhibits no tenderness.  Abdominal: Soft. Bowel sounds are normal. He exhibits no distension and no mass. There is no tenderness.       Small ventral hernia  Genitourinary: Rectum normal and penis normal. Guaiac negative stool. No penile tenderness.       Prostate +2 enlarged and benign  Musculoskeletal: Normal range of motion. He exhibits no edema and no tenderness.  Lymphadenopathy:    He has no cervical adenopathy.  Neurological: He is alert. He has normal reflexes. No cranial nerve deficit. Coordination normal.  Skin: Skin is warm and dry. No rash noted.  Psychiatric: He has a normal mood and affect. His behavior is normal.          Assessment & Plan:   Unremarkable health maintenance exam Coronary artery disease. Asymptomatic. We'll continue aggressive risk factor modification and attempts at tobacco cessation Hypertension well controlled Dyslipidemia well controlled Ankylosing spondylitis Colonic polyps. We'll schedule followup colonoscopy

## 2010-09-11 NOTE — Patient Instructions (Signed)
Limit your sodium (Salt) intake  Please check your blood pressure on a regular basis.  If it is consistently greater than 150/90, please make an office appointment.    It is important that you exercise regularly, at least 20 minutes 3 to 4 times per week.  If you develop chest pain or shortness of breath seek  medical attention.  Schedule your colonoscopy to help detect colon cancer.  Return in 6 months for follow-up

## 2010-09-25 NOTE — Op Note (Signed)
NAME:  Brandon [name], Brandon [name] MODE NO.:  [id number]   MEDICAL RECORD NO.:  [id number]          PATIENT TYPE:  OIB   LOCATION:  3599                         FACILITY:  [facility]   PHYSICIAN:  Donalee Citrin, M.D.        DATE OF BIRTH:  [birth year]   DATE OF PROCEDURE:  06/26/2007  DATE OF DISCHARGE:                               OPERATIVE REPORT   PREOPERATIVE DIAGNOSIS:  Right S1 radiculopathy from large herniated  disc, L5-S1, right.   PROCEDURE:  Lumbar laminectomy with microdiskectomy, L5-S1, right.  Microscopic dissection of the right S1 nerve root.  Microscopic  diskectomy.   SURGEON:  Donalee Citrin, M.D.   ASSISTANT:  Reinaldo Meeker, M.D.   ANESTHESIA:  General endotracheal.   HISTORY OF PRESENT ILLNESS:  Patient is a very pleasant 55 year old  gentleman who had longstanding right leg pain radiating down the  posterior thigh to the calf into the bottom of the foot with numbness  and tingling in the same distribution.  Patient failed all forms of  conservative treatment.  MRI showed a large ruptured disk with free  fragment migrating caudally behind the S1 vertebral body, displacing the  S1 nerve root against the pedicle.  The patient failed all forms of  conservative treatment, and he was recommended a laminectomy and  microdiskectomy.  Risks and benefits of the operation were explained,  and the patient understood and agreed to proceed forward.   Patient was brought to the OR, was induced under general anesthesia,  placed prone.  The back was prepped and draped in the usual sterile  fashion.  A preoperative x-ray  localized the appropriate level.  After  infiltration of 10 cc of lidocaine with epi, a midline incision was  made.  Bovie electrocautery was used to dissect the subcutaneous tissue  and subperiosteal dissection was carried out to the lamina of L4-5 and  S1 on the right side.  The intraoperative x-ray confirmed  localization  of the appropriate level.  Then using  a high speed drill, the inferior  aspect of the L5 medial facet complex, superior aspect of S1 was drilled  down and using a 2 and 3 Kerrison punch, the laminotomy was completed.  The ligamentum flavum was noted to be markedly hypertrophied.  This was  also removed in a piecemeal fashion, exposing the thecal sac and  proximal S1 nerve root.  At this point, the operating microscope was  draped and brought into the field.  Under microscopic illumination, the  lateral aspect of the gutter  was under-bitten to further identify and  decompress and unroof the S1 neural foramen.  The S1 nerve root was  noted to be markedly displaced laterally, using a 4 Penfield, filling in  the __________  via S1 nerve root.  A large free fragment disk was  immediately appreciated.  This was teased away with a blunt nerve hook  and  removed with pituitary rongeurs.  After the self-retaining  retraction was moved from the axilla, the S1 nerve root was mobilized  off the disk space and reflected medially.  An annulotomy was made with  an 11 blade scalpel, and disk space was cleaned out.  At the end of the  diskectomy, there was no stenosis on the thecal sac or S1 nerve root.  It was aggressively explored with a hockey stick and coronary dilator,  both within the axilla and on the under-surface of the canal and noted  to be widely decompressed.  The wound was copiously irrigated,  meticulously hemostasis was maintained. Gelfoam was  laid on top of the dura.  The muscle and fascia were approximated in  layers with interrupted Vicryl, and the skin was closed with a running 4-  0 subcuticular with Benzoin and Steri-Strips applied.  Patient was taken  to the recovery room in stable condition.  At the end of the case all  instrument counts were correct.           ______________________________  Donalee Citrin, M.D.     GC/MEDQ  D:  06/26/2007  T:  06/27/2007  Job:  [id number]

## 2010-09-28 NOTE — Cardiovascular Report (Signed)
NAME:  Brandon [name], Brandon [name] NO.:  [id number]   MEDICAL RECORD NO.:  [id number]          PATIENT TYPE:  INP   LOCATION:  2930                         FACILITY:  [facility]   PHYSICIAN:  Salvadore Farber, M.D. LHCDATE OF BIRTH:  10/25/1955   DATE OF PROCEDURE:  DATE OF DISCHARGE:                              CARDIAC CATHETERIZATION   PROCEDURE:  Left heart catheterization, left ventriculography, coronary  angiography, drug-eluting stent placement in the left anterior descending  with kissing balloon angioplasty of the jailed first diagonal branch.   INDICATION:  Mr. Richeson is a 55 year old gentleman without prior history of  cardiovascular disease.  Risk factors are age,  treated hypertension,  treated dyslipidemia, and ongoing tobacco abuse.  He presented this morning  to [facility] emergency room with chest discomfort, having occurred last  night.  He was pain-free on arrival but had elevated cardiac enzymes with a  CK MV of 88.  He was referred for urgent diagnostic angiography with an  ___________ percutaneous revascularization.   PROCEDURAL TECHNIQUE:  Informed consent was obtained.  Under 1% Lidocaine  anesthesia, a 6 French sheath was placed in the right common femoral  arteries in a modified Seldinger technique.  Diagnostic angiography and  ventriculography were performed using JL 4, JR 4, and pigtail catheters.  These images demonstrated the culprit stenosis to be a 60-70% stenosis of  the mid LAD beginning just at the take-off of the omni diagonal branch.  There was no involvement of the ostium of the diagonal itself.  Plans were  made for intervention.   Anticoagulation was initiated with heparin and in addition to the  eptifibatide on which the patient arrived in the lab.  The adjunctive  heparin was given to achieve and maintain an ACT of greater than 200  seconds.  A CLS 3.5 guide was advanced over wiring engaged in the ostium of  the left main.  A luge  wire was advanced to the distal vessel without  difficulty.  The lesion was directly stented using a 2.5 x 23 mm CYPHER at  16 atmospheres.  With this, there was severe plaque shift into the ostium of  the LAD resulting in a 90% ostial stenosis.  There remained TIMI-3 flow in  the vessel and the patient remained free of chest discomfort or  electrocardiographic abnormality.  Based on the severe stenosis of the  diagonal, decision was made to treat this with balloon angioplasty.  A luge  wire was advanced into the diagonal with minimal difficulty.  The ostium of  the diagonal was treated with a 2.5 mm Maverick at 6 atmospheres with marked  improvement.  The distal portion of the LAD stent was then post-dilated  using a 3.0 mm power cell at 16 atmospheres.  The proximal portion of the  LAD stent was post-dilated using a 3.5 mm power cell at 16 atmospheres.  With this post-dilation, there was again plaque shift into the ostium  resulting in a 90% stenosis of the diagonal.  I proceeded to a kissing  balloon angioplasty.  I was unable to pass the  2.5 mm Maverick into the  diagonal, raising concern that the previously placed wire was in fact under  a stent strut.  Therefore, the wire was removed from the diagonal and  advanced in a J-fashion through the stent to insure that it was in fact not  under a stent strut.  It was then carefully withdrawn to the take-off of the  diagonal and advanced into the diagonal.  With this I was able to easily  advance the 2.5 mm balloon.  Kissing ballon angioplasty using a 3.0 mm  Maverick in the LAD and the 2.5 mm Maverick in the diagonal were performed  for two successive inflations at 8 atmospheres.  Final angiography  demonstrated no residual stenosis in the LAD or the diagonal with TIMI-3  flow to both vessels.   Finally, the arteriotomy was closed using a 6 [place] Angioseal device.  Complete hemostasis was obtained.  The patient tolerated the procedure  well  and was transferred to the holding room in stable condition.   COMPLICATIONS:  None.   FINDINGS:  1.  LV: 130/13/17.  EF 55% with mild anterolateral hypokinesis.  2.  No aortic stenosis or mitral regurgitation.  3.  Left main:  Angiographically normal.  4.  LAD:  Moderate-sized vessel giving rise to a single large diagonal.      There is a 60-70% hazy stenosis beginning at the take-off of the      diagonal.  Films were reviewed with Dr. Dickie La who agreed that this was      the culprit lesion based on its angiographic appearance and upon the EKG      demonstrating anterolateral T wave inversions.  This was treated with      drug-eluting stenting with kissing balloon angioplasty of the diagonal      with excellent result.  5.  Circumflex:  Moderate-sized vessel giving rise to a single obtuse      marginal.  There is a 50% stenosis of the circumflex extending into the      proximal portion of the marginal.  6. RCA:  Large, dominant vessel.      There are mild to moderate luminal irregularities throughout its course.      There is a 50% stenosis of the proximal vessel.   IMPRESSION/RECOMMENDATIONS:  Successful drug-eluding stent placement in a  culprit lesion of the mid LAD with kissing balloon angioplasty of the jailed  diagonal branch.  We will plan on continuing Plavix for a year.  Aspirin  will be continued indefinitely.       WED/MEDQ  D:  02/14/2004  T:  02/14/2004  Job:  [id number]   cc:   Lovenia Kim, D.O.  [address]  [place]  [place] [address]  Fax: [phone]   Learta Codding, M.D. [facility]

## 2010-09-28 NOTE — H&P (Signed)
NAME:  Kotlarz, ANEL PUROHIT NO.:  [id number]   MEDICAL RECORD NO.:  [id number]          PATIENT TYPE:  EMS   LOCATION:  ED                           FACILITY:  [facility]   PHYSICIAN:  Jesse Sans. Wall, M.D.   DATE OF BIRTH:  March 24, 1956   DATE OF ADMISSION:  10/25/2004  DATE OF DISCHARGE:                                HISTORY & PHYSICAL   CHIEF COMPLAINT:  Nausea, dizziness, and chest pain.   HISTORY OF PRESENT ILLNESS:  [name] is a very pleasant 55 year old white  male with a non-ST segment wave MI in October of 2005. At that time, he had  a PCI of the LAD with a kissing balloon technique of a jailed diagonal-1. He  has ongoing tobacco use of a half pack per day, hyperlipidemia, and  hypertension.   This evening, he was working out. He noted that when he was lifting weights  he developed a very well localized sharp chest pain over his left breast. He  then noted that his heart rate went up to 140. He stopped exercising and  went to a [employer] here in [place]. He had a beer  and a cigarette and on his second beer he noticed some nausea. He left the  bar and sat in his car. His heart rate on his monitor, which he was still  wearing, went down around 50. He had some neck discomfort but it was minor.  He became slightly clammy. He did not pass out. He decided to come to [facility] Emergency Room.   EKG here was essentially unremarkable. Point of care markers were negative.  Vital signs were stable. O2 saturation were normal.   ALLERGIES:  No known drug allergies.   CURRENT MEDICATIONS:  1.  Lipitor 40 mg daily.  2.  Aspirin 325 mg daily.  3.  Plavix 75 mg daily.  4.  Naproxen 250 mg b.i.d.  5.  Atenolol 50 mg daily.   PAST MEDICAL HISTORY:  In addition to the above, he has a history of  ankylosing spondylitis.   SOCIAL HISTORY:  He lives here by himself in [place]. He formerly lived  in [place] up until three years  ago. 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 He  has a 25-pack year smoking history. He drinks about three to four beers per  day.   FAMILY HISTORY:  His mother died of cancer at age 86. Father is alive at 26.  There is no premature history of coronary disease.   REVIEW OF SYMPTOMS:  Other than the HPI, totally negative.   PHYSICAL EXAMINATION:  VITAL SIGNS:  Blood pressure on arrival to the  emergency room was 117/77, pulse was 98 and regular, respiratory rate 20.  Saturation was 100%. Temperature was 96.7.  SKIN:  Warm and dry. He is in no acute distress.  HEENT:  Sclerae are clear. Pupils are equal, round, and reactive to light  and accommodation. Extraocular movements intact. Facial symmetry is normal.  NECK:  Carotid upstrokes were equal bilaterally without bruits. No JVD.  Thyroid was  not enlarged.  LUNGS:  Clear.  HEART:  Regular rate and rhythm without murmurs, gallops, or rubs.  ABDOMEN:  Soft. There is no midline bruit. There is no hepatosplenomegaly.  EXTREMITIES:  There was no clubbing, cyanosis, or edema. Pulses were intact.  NEUROLOGICAL:  Intact.   LABORATORY DATA:  His electrocardiogram here tonight is essentially normal.  T-wave changes in the lateral leads that were here in October have now  resolved.   ASSESSMENT:  Atypical chest pain with some slight diaphoresis and variation  in pulse, postexercise with alcohol and nicotine on board. He has a history  of coronary artery disease so we need to rule out ischemia versus a  vasovagal event.   PLAN:  1.  Admit to telemetry.  2.  Check cardiac enzymes q.8h. x3. If cardiac enzymes are negative,      outpatient Myoview with Dr. Vernie Shanks. DeGent follow-up.  3.  Replace potassium.  His potassium is slightly low at 3.4.  4.  Stop smoking.  5.  Continue current medications otherwise.       TCW/MEDQ  D:  10/25/2004  T:  10/25/2004  Job:  [id number]   cc:   Learta Codding, M.D. [facility]   Lovenia Kim, D.O.  [address]  [place]  [place] [address]  Fax: [phone]

## 2010-09-28 NOTE — H&P (Signed)
NAME:  Brandon [name], Brandon [name] NO.:  [id number]   MEDICAL RECORD NO.:  [id number]          PATIENT TYPE:  EMS   LOCATION:  ED                           FACILITY:  [facility]   PHYSICIAN:  Learta Codding, M.D. LHCDATE OF BIRTH:  Sep 13, 1955   DATE OF ADMISSION:  02/13/2004  DATE OF DISCHARGE:                                HISTORY & PHYSICAL   REFERRED BY:  Lovenia Kim, D.O.   CARDIOLOGIST:  [place] Learta Codding, M.D.)   REASON FOR ADMISSION:  Substernal chest pain xseveral days off and on.   HISTORY OF PRESENT ILLNESS:  The patient is a 55 year old male with no prior  history of coronary artery disease in his usual state of health until this  weekend when the patient started experiencing intermittent substernal chest  pain and arm pain. The patient noticed that exertion made his symptoms worse  and therefore took it easy in the next couple of days and limited his  activity.  Last evening, he went to bed around 9:30 and woke up around 11:30  with some substernal chest pain and arm pain and was unable to go to sleep.  This morning when he went to work, his arms were still heavy and he felt  woozy and decided to come to the emergency room. On arrival to the emergency  room, his EKG did show T wave changes in the lateral leads and subsequently  was given a sublingual nitroglycerin which promptly dropped his blood  pressure to the 70's systolic.  He was given IV fluids and continued to  experience chest tightness which has now resolved but still complaining of  bilateral arm pain. He also had associated nausea but no diaphoresis.  His  initial enzyme markers are positive for troponin of 3.76.   ALLERGIES:  No known drug allergies.  The patient reports no IVP dye  allergy.   MEDICATIONS:  Benicar and Lipitor.   PAST MEDICAL HISTORY:  Notable for hypertension, dyslipidemia, tobacco use,  history of ankylospondylitis.   SOCIAL HISTORY:  The patient lives in [place] by  himself. He formerly  lived in [place] up until three years ago.  He is an Corporate investment banker. He has a 25 pack year history, he drinks about 3 or 4 beers a day.  Families are notable for mother died at 67 from cancer, father is alive at  31. There is no history of CAD in siblings.   REVIEW OF SYMPTOMS:  No fever or chills, no headache or sore throat,  positive for chest pain, shortness of breath, dyspnea on exertion but no  orthopnea or PND.  No palpitations or syncope.  No frequency or dysuria,  positive for weakness and arthralgias. Positive for nausea and vomiting.   PHYSICAL EXAMINATION:  VITAL SIGNS:  Blood pressure 159/105, heart rate is  98 beats per minutes, respirations are 18.  GENERAL:  Well-nourished white male in no apparent distress.  HEENT:  NCAT, PERRLA, EOMI.  NECK:  Supple, no JVD, no carotid bruits.  LUNGS:  Faint wheezes at the bases and without crackles.  HEART:  Regular rate and rhythm, normal S1, S2.  No pathological murmur.  SKIN:  No rash or lesions.  ABDOMEN:  Soft, nontender.  GU/RECTAL:  Deferred.  EXTREMITIES:  No cyanosis, clubbing or edema.  NEUROLOGIC:  The patient is alert and oriented and grossly nonfocal.   Chest x-ray, no cardiomegaly, mild bibasilar atelectasis but no infiltrates  and no evidence of vascular congestion.  EKG, heart rate 105 beats per  minute, sinus tachycardia with lateral T wave changes (biphasic).   LABORATORY DATA:  Hemoglobin 14.9, white count 11.7, platelet count 385,  sodium 135, potassium 3.9, BUN 14, creatinine is 0.7, chloride is 103,  bicarb 25, glucose 108, magnesium 2.2, myoglobin greater than 500, CK-MB  greater than 80, troponin 3.76.   PROBLEM LIST:  1.  Non-ST elevation myocardial infarction.  2.  Hypertension.  3.  Hypotension following intravenous nitroglycerin.  4.  Dyslipidemia.  5.  Tobacco use.   PLAN:  The patient appears to have an acute Non-ST elevation myocardial  infarction with ongoing  substernal chest pain.  He will be placed on  antithrombotic and antiplatelet therapy and the plan is to proceed with a  cardiac catheterization today. The risks and benefits of the procedure have  been discussed with the patient and he is willing to proceed.     Michelle Piper   GED/MEDQ  D:  02/13/2004  T:  02/13/2004  Job:  [id number]

## 2010-09-28 NOTE — Discharge Summary (Signed)
NAME:  Brandon [name], Brandon [name] NO.:  [id number]   MEDICAL RECORD NO.:  [id number]          PATIENT TYPE:  INP   LOCATION:  2013                         FACILITY:  [facility]   PHYSICIAN:  Learta Codding, M.D. LHCDATE OF BIRTH:  1955-11-14   DATE OF ADMISSION:  02/13/2004  DATE OF DISCHARGE:  02/15/2004                           DISCHARGE SUMMARY - REFERRING   DISCHARGE DIAGNOSES:  1.  Non-ST segment elevated myocardial infarction.  2.  Coronary artery disease.  3.  Dyslipidemia, treated.  4.  Smoking/tobacco abuse, smoking cessation counseling.  5.  Hypertension, treated.   HOSPITAL COURSE:  [name] is a 55 year old male patient with no known  history of coronary artery disease who awoke the day prior to admission with  substernal chest pain that was intermittent throughout the day.  On the day  of admission he went to work but continued to have chest tightness and felt  woozy.  He was initially seen at [facility] and emergently  transferred to [facility] where he underwent cardiac catheterization and  successful drug-eluting stent placement to the mid LAD lesion with kissing  balloon angioplasty of the gelled first diagonal.  Dr. Samule Ohm performed the  procedure and recommended aspirin indefinitely and Plavix for one year.   The patient remained in the hospital until February 15, 2004, and was thought  to be ready for discharge to home.   Lab studies during this patient's stay included a BUN of 12, creatinine 0.8,  maximum CK was 89, LDL 68, HDL 52.   DISCHARGE MEDICATIONS INCLUDE:  1.  Atenolol 50 mg daily.  2.  Enteric coated aspirin 325 mg daily.  3.  Lipitor 40 mg q.h.s.  4.  Benicar 20 mg daily.  5.  Plavix 75 mg daily for a year.  6.  Wellbutrin 150 mg one p.o. daily for 3 days then one p.o. b.i.d.  7.  Sublingual nitroglycerin p.r.n. chest pain.   No straining or lifting over 10 pounds for one week.  No exercise except as  per cardiac  rehabilitation.  Remain on a low fat diet.  Clean over cath site  with soap and water, no scrubbing.  Call for questions or concerns.   FOLLOW UP:  At Dr. Margarita Mail office on February 28, 2004, at 1 p.m. for groin  check and cardiology followup.      Larita Fife   LB/MEDQ  D:  02/14/2004  T:  02/14/2004  Job:  [id number]   cc:   Learta Codding, M.D. [facility]   Lovenia Kim, D.O.  [address]  [place]  [place] [address]  Fax: [phone]

## 2010-10-17 ENCOUNTER — Encounter: Payer: Self-pay | Admitting: Gastroenterology

## 2010-10-17 ENCOUNTER — Ambulatory Visit (INDEPENDENT_AMBULATORY_CARE_PROVIDER_SITE_OTHER): Payer: BC Managed Care – PPO | Admitting: Gastroenterology

## 2010-10-17 VITALS — BP 128/82 | HR 80 | Ht 72.0 in | Wt 193.2 lb

## 2010-10-17 DIAGNOSIS — Z860100 Personal history of colon polyps, unspecified: Secondary | ICD-10-CM

## 2010-10-17 MED ORDER — PEG-KCL-NACL-NASULF-NA ASC-C 100 G PO SOLR: 1.0000 | Freq: Once | ORAL | Status: DC

## 2010-10-17 NOTE — Assessment & Plan Note (Signed)
Plan followup colonoscopy. Plavix will be held in advance of the procedure.  Risks, alternatives, and complications of the procedure, including bleeding, perforation, and possible need for surgery, were explained to the patient.  Patient's questions were answered.

## 2010-10-17 NOTE — Progress Notes (Signed)
History of Present Illness:  Mr. Bayona is a pleasant 55 year old white male with history of coronary artery disease, colon polyps, on Plavix, referred at the request of Dr. Amador Cunas for follow up colonoscopy. An adenomatous polyp was removed from the hepatic flexure in 2006. Followup colonoscopy one year later demonstrated a hyperplastic polyp only. He has no GI complaints including change of bowel habits, abdominal pain, melena or hematochezia.  Patien is on Plavix. He is status post MI from several years ago.    Review of Systems: Pertinent positive and negative review of systems were noted in the above HPI section. All other review of systems were otherwise negative.    Current Medications, Allergies, Past Medical History, Past Surgical History, Family History and Social History were reviewed in [facility] electronic medical record  Vital signs were reviewed in today's medical record. Physical Exam: General: Well developed , well nourished, no acute distress Head: Normocephalic and atraumatic Eyes:  sclerae anicteric, EOMI Ears: Normal auditory acuity Mouth: No deformity or lesions Lungs: Clear throughout to auscultation Heart: Regular rate and rhythm;rubs or bruits; there is a 1/6 early systolic murmur Abdomen: Soft, non tender and non distended. No masses, hepatosplenomegaly or hernias noted. Normal Bowel sounds; there is diastases of his rectus sheath Rectal:deferred Musculoskeletal: Symmetrical with no gross deformities  Pulses:  Normal pulses noted Extremities: No clubbing, cyanosis, edema or deformities noted Neurological: Alert oriented x 4, grossly nonfocal Psychological:  Alert and cooperative. Normal mood and affect

## 2010-10-17 NOTE — Patient Instructions (Signed)
Colonoscopy A colonoscopy is an exam to evaluate your entire colon. In this exam, your colon is cleansed. A long fiberoptic tube is inserted through your rectum and into your colon. The fiberoptic scope (endoscope) is a long bundle of enclosed and very flexible fibers. These fibers transmit light to the area examined and send images from that area to your caregiver. Discomfort is usually minimal. You may be given a drug to help you sleep (sedative) during or prior to the procedure. This exam helps to detect lumps (tumors), polyps, inflammation, and areas of bleeding. Your caregiver may also take a small piece of tissue (biopsy) that will be examined under a microscope. BEFORE THE PROCEDURE  A clear liquid diet may be required for 2 days before the exam.   Liquid injections (enemas) or laxatives may be required.   A large amount of electrolyte solution may be given to you to drink over a short period of time. This solution is used to clean out your colon.   You should be present 1 prior to your procedure or as directed by your caregiver.   Check in at the admissions desk to fill out necessary forms if not preregistered. There will be consent forms to sign prior to the procedure. If accompanied by friends or family, there is a waiting area for them while you are having your procedure.  LET YOUR CAREGIVER KNOW ABOUT:  Allergies to food or medicine.  Medicines taken, including vitamins, herbs, eyedrops, over-the-counter medicines, and creams.   Use of steroids (by mouth or creams).   Previous problems with anesthetics or numbing medicines.   History of bleeding problems or blood clots.  Previous surgery.   Other health problems, including diabetes and kidney problems.   Possibility of pregnancy, if this applies.   AFTER THE PROCEDURE  If you received a sedative and/or pain medicine, you will need to arrange for someone to drive you home.   Occasionally, there is a little blood passed  with the first bowel movement. DO NOT be concerned.  HOME CARE INSTRUCTIONS  It is not unusual to pass moderate amounts of gas and experience mild abdominal cramping following the procedure. This is due to air being used to inflate your colon during the exam. Walking or a warm pack on your belly (abdomen) may help.   You may resume all normal meals and activities after sedatives and medicines have worn off.   Only take over-the-counter or prescription medicines for pain, discomfort, or fever as directed by your caregiver. DO NOT use aspirin or blood thinners if a biopsy was taken. Consult your caregiver for medicine usage if biopsies were taken.  FINDING OUT THE RESULTS OF YOUR TEST Not all test results are available during your visit. If your test results are not back during the visit, make an appointment with your caregiver to find out the results. Do not assume everything is normal if you have not heard from your caregiver or the medical facility. It is important for you to follow up on all of your test results. SEEK IMMEDIATE MEDICAL CARE IF:     You pass large blood clots or fill a toilet with blood following the procedure. This may also occur 10 to 14 days following the procedure. This is more likely if a biopsy was taken.   You develop abdominal pain that keeps getting worse and cannot be relieved with medicine.  Document Released: 04/26/2000 Document Re-Released: 07/24/2009 [facility] Patient Information 2011 [place], [place]. Your Colonoscopy is scheduled  on 11/26/2010 at 9:30am

## 2010-11-16 ENCOUNTER — Telehealth: Payer: Self-pay | Admitting: *Deleted

## 2010-11-16 NOTE — Telephone Encounter (Signed)
Need a response on pts Plavix letter. Pt has a procedure on the 16th, Please advise if pt can come off his plavix.

## 2010-11-16 NOTE — Telephone Encounter (Signed)
Okay to stop Plavix prior to procedure; patient is status post stenting in 2008

## 2010-11-16 NOTE — Telephone Encounter (Signed)
Message copied by Marlowe Kays on Fri Nov 16, 2010  3:22 PM ------      Message from: Marlowe Kays      Created: Wed Oct 17, 2010  8:56 AM      Regarding: Plavix       Make sure we have ok for pt to come off plavix prior to his procedure on 11/26/2010

## 2010-11-19 NOTE — Telephone Encounter (Signed)
Ok for pt to come off Plavix per Dr Amador Cunas, Called pt to inform l/m

## 2010-11-26 ENCOUNTER — Ambulatory Visit (AMBULATORY_SURGERY_CENTER): Payer: BC Managed Care – PPO | Admitting: Gastroenterology

## 2010-11-26 ENCOUNTER — Encounter: Payer: Self-pay | Admitting: Gastroenterology

## 2010-11-26 DIAGNOSIS — K5730 Diverticulosis of large intestine without perforation or abscess without bleeding: Secondary | ICD-10-CM

## 2010-11-26 DIAGNOSIS — D126 Benign neoplasm of colon, unspecified: Secondary | ICD-10-CM

## 2010-11-26 DIAGNOSIS — Z860100 Personal history of colon polyps, unspecified: Secondary | ICD-10-CM

## 2010-11-26 DIAGNOSIS — Z1211 Encounter for screening for malignant neoplasm of colon: Secondary | ICD-10-CM

## 2010-11-26 DIAGNOSIS — K635 Polyp of colon: Secondary | ICD-10-CM

## 2010-11-26 MED ORDER — SODIUM CHLORIDE 0.9 % IV SOLN: 500.0000 mL | INTRAVENOUS | Status: DC

## 2010-11-26 NOTE — Patient Instructions (Addendum)
Resume plavix in 5 days.  Diverticulosis Diverticulosis is a common condition that develops when small pouches (diverticula) form in the wall of the colon. The risk of diverticulosis increases with age. It happens more often in people who eat a low-fiber diet. Most individuals with diverticulosis have no symptoms. Those individuals with symptoms usually experience belly (abdominal) pain, constipation, or loose stools (diarrhea). HOME CARE INSTRUCTIONS  Increase the amount of fiber in your diet as directed by your caregiver or dietician. This may reduce symptoms of diverticulosis.   Your caregiver may recommend taking a dietary fiber supplement.   Drink at least 6 to 8 glasses of water each day to prevent constipation.   Try not to strain when you have a bowel movement.   Your caregiver may recommend avoiding nuts and seeds to prevent complications, although this is still an uncertain benefit.   Only take over-the-counter or prescription medicines for pain, discomfort, or fever as directed by your caregiver.  FOODS HAVING HIGH FIBER CONTENT INCLUDE:  Fruits. Apple, peach, pear, tangerine, raisins, prunes.   Vegetables. Brussels sprouts, asparagus, broccoli, cabbage, carrot, cauliflower, romaine lettuce, spinach, summer squash, tomato, winter squash, zucchini.   Starchy Vegetables. Baked beans, kidney beans, lima beans, split peas, lentils, potatoes (with skin).   Grains. Whole wheat bread, brown rice, bran flake cereal, plain oatmeal, white rice, shredded wheat, bran muffins.  SEEK IMMEDIATE MEDICAL CARE IF:  You develop increasing pain or severe bloating.   You have an oral temperature above 100, not controlled by medicine.   You develop vomiting or bowel movements that are bloody or black.  Document Released: 01/25/2004 Document Re-Released: 10/17/2009 [facility] Patient Information 2011 [place], [place]  .Polyps, Colon  A polyp is extra tissue that grows inside your body. Colon  polyps grow in the large intestine. The large intestine, also called the colon, is part of your digestive system. It is a long, hollow tube at the end of your digestive tract where your body makes and stores stool. Most polyps are not dangerous. They are benign. This means they are not cancerous. But over time, some types of polyps can turn into cancer. Polyps that are smaller than a pea are usually not harmful. But larger polyps could someday become or may already be cancerous. To be safe, doctors remove all polyps and test them.  WHO GETS POLYPS? Anyone can get polyps, but certain people are more likely than others. You may have a greater chance of getting polyps if:  You are over 50.   You have had polyps before.   Someone in your family has had polyps.   Someone in your family has had cancer of the large intestine.   Find out if someone in your family has had polyps. You may also be more likely to get polyps if you:   Eat a lot of fatty foods   Smoke   Drink alcohol   Do not exercise  Eat too much  SYMPTOMS Most small polyps do not cause symptoms. People often do not know they have one until their caregiver finds it during a regular checkup or while testing them for something else. Some people do have symptoms like these:  Bleeding from the anus. You might notice blood on your underwear or on toilet paper after you have had a bowel movement.   Constipation or diarrhea that lasts more than a week.   Blood in the stool. Blood can make stool look black or it can show up as red  streaks in the stool.  If you have any of these symptoms, see your caregiver. HOW DOES THE DOCTOR TEST FOR POLYPS? The doctor can use four tests to check for polyps:  Digital rectal exam. The caregiver wears gloves and checks your rectum (the last part of the large intestine) to see if it feels normal. This test would find polyps only in the rectum. Your caregiver may need to do one of the other tests listed  below to find polyps higher up in the intestine.   Barium enema. The caregiver puts a liquid called barium into your rectum before taking x-rays of your large intestine. Barium makes your intestine look white in the pictures. Polyps are dark, so they are easy to see.   Sigmoidoscopy. With this test, the caregiver can see inside your large intestine. A thin flexible tube is placed into your rectum. The device is called a sigmoidoscope, which has a light and a tiny video camera in it. The caregiver uses the sigmoidoscope to look at the last third of your large intestine.   Colonoscopy. This test is like sigmoidoscopy, but the caregiver looks at all of the large intestine. It usually requires sedation. This is the most common method for finding and removing polyps.  TREATMENT  The caregiver will remove the polyp during sigmoidoscopy or colonoscopy. The polyp is then tested for cancer.   If you have had polyps, your caregiver may want you to get tested regularly in the future.  PREVENTION There is not one sure way to prevent polyps. You might be able to lower your risk of getting them if you:  Eat more fruits and vegetables and less fatty food.   Do not smoke.   Avoid alcohol.   Exercise every day.   Lose weight if you are overweight.   Eating more calcium and folate can also lower your risk of getting polyps. Some foods that are rich in calcium are milk, cheese, and broccoli. Some foods that are rich in folate are chickpeas, kidney beans, and spinach.   Aspirin might help prevent polyps. Studies are under way.  Document Released: 01/24/2004 Document Re-Released: 10/17/2009 [facility] Patient Information 2011 [place], [place]. Discharge instructions given with verbal understanding. Handouts on polyps and diverticulosis given. Resume Plavix in 5 days. Resume previous medications.

## 2010-11-27 ENCOUNTER — Telehealth: Payer: Self-pay

## 2010-11-27 NOTE — Telephone Encounter (Signed)
No ID on answering machine. 

## 2011-02-01 LAB — BASIC METABOLIC PANEL
BUN: 12
CO2: 28
Calcium: 9.8
Chloride: 100
Creatinine, Ser: 0.82
GFR calc Af Amer: >60
GFR calc non Af Amer: >60
Glucose, Bld: 100 — ABNORMAL HIGH
Potassium: 4.4
Sodium: 136

## 2011-02-01 LAB — CBC
HCT: 46.1
Hemoglobin: 15.7
MCHC: 34
MCV: 91.8
Platelets: 244
RBC: 5.02
RDW: 14
WBC: 14.1 — ABNORMAL HIGH

## 2011-03-08 ENCOUNTER — Ambulatory Visit (INDEPENDENT_AMBULATORY_CARE_PROVIDER_SITE_OTHER): Payer: BC Managed Care – PPO | Admitting: Internal Medicine

## 2011-03-08 ENCOUNTER — Encounter: Payer: Self-pay | Admitting: Internal Medicine

## 2011-03-08 DIAGNOSIS — M533 Sacrococcygeal disorders, not elsewhere classified: Secondary | ICD-10-CM

## 2011-03-08 DIAGNOSIS — E785 Hyperlipidemia, unspecified: Secondary | ICD-10-CM

## 2011-03-08 DIAGNOSIS — I2510 Atherosclerotic heart disease of native coronary artery without angina pectoris: Secondary | ICD-10-CM

## 2011-03-08 DIAGNOSIS — I10 Essential (primary) hypertension: Secondary | ICD-10-CM

## 2011-03-08 DIAGNOSIS — M4328 Fusion of spine, sacral and sacrococcygeal region: Secondary | ICD-10-CM

## 2011-03-08 DIAGNOSIS — F17200 Nicotine dependence, unspecified, uncomplicated: Secondary | ICD-10-CM

## 2011-03-08 MED ORDER — TELMISARTAN-HCTZ 80-12.5 MG PO TABS: 1.0000 | ORAL_TABLET | Freq: Every day | ORAL | Status: DC

## 2011-03-08 MED ORDER — TAMSULOSIN HCL 0.4 MG PO CAPS: 0.4000 mg | ORAL_CAPSULE | Freq: Every day | ORAL | Status: DC

## 2011-03-08 MED ORDER — BUPROPION HCL ER (SR) 150 MG PO TB12: 150.0000 mg | ORAL_TABLET | Freq: Two times a day (BID) | ORAL | Status: DC

## 2011-03-08 MED ORDER — ATENOLOL 50 MG PO TABS: 50.0000 mg | ORAL_TABLET | Freq: Every day | ORAL | Status: DC

## 2011-03-08 MED ORDER — SILDENAFIL CITRATE 50 MG PO TABS: 50.0000 mg | ORAL_TABLET | Freq: Every day | ORAL | Status: DC | PRN

## 2011-03-08 MED ORDER — ATORVASTATIN CALCIUM 80 MG PO TABS: 80.0000 mg | ORAL_TABLET | Freq: Every day | ORAL | Status: DC

## 2011-03-08 MED ORDER — CLOPIDOGREL BISULFATE 75 MG PO TABS: 75.0000 mg | ORAL_TABLET | Freq: Every day | ORAL | Status: DC

## 2011-03-08 NOTE — Patient Instructions (Signed)
Smoking tobacco is very bad for your health. You should stop smoking immediately.  Limit your sodium (Salt) intake  Return in 6 months for follow-up   

## 2011-03-08 NOTE — Progress Notes (Signed)
  Subjective:    Patient ID: Brandon [name], male    DOB: 08-31-1955, 55 y.o.   MRN: [id number]  HPI  55 year old patient who is in today for his six-month followup. He is known well he has cut his smoking consumption down to approximately 6 cigarettes daily he has hypertension and coronary artery disease. He remains asymptomatic and he does exercise regularly he has treated hypertension which has been stable. He is on atorvastatin which he tolerates well at 80 mg daily no exertional chest pain no concerns or complaints    Review of Systems  Constitutional: Negative for fever, chills, appetite change and fatigue.  HENT: Negative for hearing loss, ear pain, congestion, sore throat, trouble swallowing, neck stiffness, dental problem, voice change and tinnitus.   Eyes: Negative for pain, discharge and visual disturbance.  Respiratory: Negative for cough, chest tightness, wheezing and stridor.   Cardiovascular: Negative for chest pain, palpitations and leg swelling.  Gastrointestinal: Negative for nausea, vomiting, abdominal pain, diarrhea, constipation, blood in stool and abdominal distention.  Genitourinary: Negative for urgency, hematuria, flank pain, discharge, difficulty urinating and genital sores.  Musculoskeletal: Negative for myalgias, back pain, joint swelling, arthralgias and gait problem.  Skin: Negative for rash.  Neurological: Negative for dizziness, syncope, speech difficulty, weakness, numbness and headaches.  Hematological: Negative for adenopathy. Does not bruise/bleed easily.  Psychiatric/Behavioral: Negative for behavioral problems and dysphoric mood. The patient is not nervous/anxious.        Objective:   Physical Exam  Constitutional: He is oriented to person, place, and time. He appears well-developed.       Repeat blood pressure 118/78 left arm  HENT:  Head: Normocephalic.  Right Ear: External ear normal.  Left Ear: External ear normal.  Eyes: Conjunctivae and EOM are  normal.  Neck: Normal range of motion.  Cardiovascular: Normal rate and normal heart sounds.   Pulmonary/Chest: Breath sounds normal.  Abdominal: Bowel sounds are normal.  Musculoskeletal: Normal range of motion. He exhibits no edema and no tenderness.  Neurological: He is alert and oriented to person, place, and time.  Psychiatric: He has a normal mood and affect. His behavior is normal.          Assessment & Plan:   Coronary artery disease. Remains asymptomatic Ongoing tobacco use. Smoking cessation encouraged he has been able to successfully slowly taper Hypertension controlled Dyslipidemia  All medications refilled. We'll see for an annual exam in 6 months

## 2011-03-13 ENCOUNTER — Ambulatory Visit: Payer: BC Managed Care – PPO | Admitting: Internal Medicine

## 2011-09-05 ENCOUNTER — Other Ambulatory Visit (INDEPENDENT_AMBULATORY_CARE_PROVIDER_SITE_OTHER): Payer: BC Managed Care – PPO

## 2011-09-05 DIAGNOSIS — Z0000 Encounter for general adult medical examination without abnormal findings: Secondary | ICD-10-CM

## 2011-09-05 LAB — HEPATIC FUNCTION PANEL
ALT: 23 U/L (ref 0–53)
AST: 22 U/L (ref 0–37)
Albumin: 4.2 g/dL (ref 3.5–5.2)
Alkaline Phosphatase: 99 U/L (ref 39–117)
Bilirubin, Direct: 0.1 mg/dL (ref 0.0–0.3)
Total Bilirubin: 0.8 mg/dL (ref 0.3–1.2)
Total Protein: 6.7 g/dL (ref 6.0–8.3)

## 2011-09-05 LAB — POCT URINALYSIS DIPSTICK
Bilirubin, UA: NEGATIVE
Blood, UA: NEGATIVE
Glucose, UA: NEGATIVE
Ketones, UA: NEGATIVE
Leukocytes, UA: NEGATIVE
Nitrite, UA: NEGATIVE
Protein, UA: NEGATIVE
Spec Grav, UA: 1.015
Urobilinogen, UA: 0.2
pH, UA: 5.5

## 2011-09-05 LAB — LIPID PANEL
Cholesterol: 154 mg/dL (ref 0–200)
HDL: 56.9 mg/dL (ref >39.00)
LDL Cholesterol: 84 mg/dL (ref 0–99)
Total CHOL/HDL Ratio: 3
Triglycerides: 66 mg/dL (ref 0.0–149.0)
VLDL: 13.2 mg/dL (ref 0.0–40.0)

## 2011-09-05 LAB — CBC WITH DIFFERENTIAL/PLATELET
Basophils Absolute: 0 10*3/uL (ref 0.0–0.1)
Basophils Relative: 0.3 % (ref 0.0–3.0)
Eosinophils Absolute: 0.2 10*3/uL (ref 0.0–0.7)
Eosinophils Relative: 2.5 % (ref 0.0–5.0)
HCT: 46 % (ref 39.0–52.0)
Hemoglobin: 15.4 g/dL (ref 13.0–17.0)
Lymphocytes Relative: 18.5 % (ref 12.0–46.0)
Lymphs Abs: 1.5 10*3/uL (ref 0.7–4.0)
MCHC: 33.4 g/dL (ref 30.0–36.0)
MCV: 94.2 fl (ref 78.0–100.0)
Monocytes Absolute: 0.7 10*3/uL (ref 0.1–1.0)
Monocytes Relative: 8.7 % (ref 3.0–12.0)
Neutro Abs: 5.8 10*3/uL (ref 1.4–7.7)
Neutrophils Relative %: 70 % (ref 43.0–77.0)
Platelets: 192 10*3/uL (ref 150.0–400.0)
RBC: 4.89 Mil/uL (ref 4.22–5.81)
RDW: 13.6 % (ref 11.5–14.6)
WBC: 8.3 10*3/uL (ref 4.5–10.5)

## 2011-09-05 LAB — BASIC METABOLIC PANEL
BUN: 16 mg/dL (ref 6–23)
CO2: 29 mEq/L (ref 19–32)
Calcium: 9.6 mg/dL (ref 8.4–10.5)
Chloride: 102 mEq/L (ref 96–112)
Creatinine, Ser: 0.8 mg/dL (ref 0.4–1.5)
GFR: 110.3 mL/min (ref >60.00)
Glucose, Bld: 99 mg/dL (ref 70–99)
Potassium: 4.8 mEq/L (ref 3.5–5.1)
Sodium: 140 mEq/L (ref 135–145)

## 2011-09-05 LAB — PSA: PSA: 0.74 ng/mL (ref 0.10–4.00)

## 2011-09-05 LAB — TSH: TSH: 0.87 u[IU]/mL (ref 0.35–5.50)

## 2011-09-06 ENCOUNTER — Other Ambulatory Visit: Payer: Self-pay | Admitting: Neurosurgery

## 2011-09-06 DIAGNOSIS — M545 Low back pain, unspecified: Secondary | ICD-10-CM

## 2011-09-12 ENCOUNTER — Encounter: Payer: Self-pay | Admitting: Internal Medicine

## 2011-09-12 ENCOUNTER — Ambulatory Visit (INDEPENDENT_AMBULATORY_CARE_PROVIDER_SITE_OTHER): Payer: BC Managed Care – PPO | Admitting: Internal Medicine

## 2011-09-12 VITALS — BP 126/80 | HR 80 | Temp 98.7°F | Resp 18 | Ht 72.5 in | Wt 196.0 lb

## 2011-09-12 DIAGNOSIS — M4328 Fusion of spine, sacral and sacrococcygeal region: Secondary | ICD-10-CM

## 2011-09-12 DIAGNOSIS — I10 Essential (primary) hypertension: Secondary | ICD-10-CM

## 2011-09-12 DIAGNOSIS — I2510 Atherosclerotic heart disease of native coronary artery without angina pectoris: Secondary | ICD-10-CM

## 2011-09-12 DIAGNOSIS — Z299 Encounter for prophylactic measures, unspecified: Secondary | ICD-10-CM

## 2011-09-12 DIAGNOSIS — Z86010 Personal history of colonic polyps: Secondary | ICD-10-CM

## 2011-09-12 DIAGNOSIS — Z23 Encounter for immunization: Secondary | ICD-10-CM

## 2011-09-12 DIAGNOSIS — M533 Sacrococcygeal disorders, not elsewhere classified: Secondary | ICD-10-CM

## 2011-09-12 MED ORDER — SILDENAFIL CITRATE 50 MG PO TABS: 50.0000 mg | ORAL_TABLET | Freq: Every day | ORAL | Status: DC | PRN

## 2011-09-12 MED ORDER — TAMSULOSIN HCL 0.4 MG PO CAPS: 0.4000 mg | ORAL_CAPSULE | Freq: Every day | ORAL | Status: DC

## 2011-09-12 MED ORDER — ATENOLOL 50 MG PO TABS: 50.0000 mg | ORAL_TABLET | Freq: Every day | ORAL | Status: DC

## 2011-09-12 MED ORDER — TELMISARTAN-HCTZ 80-12.5 MG PO TABS: 1.0000 | ORAL_TABLET | Freq: Every day | ORAL | Status: DC

## 2011-09-12 MED ORDER — BUPROPION HCL ER (SR) 150 MG PO TB12: 150.0000 mg | ORAL_TABLET | Freq: Two times a day (BID) | ORAL | Status: DC

## 2011-09-12 MED ORDER — CLOPIDOGREL BISULFATE 75 MG PO TABS: 75.0000 mg | ORAL_TABLET | Freq: Every day | ORAL | Status: DC

## 2011-09-12 MED ORDER — ATORVASTATIN CALCIUM 80 MG PO TABS: 80.0000 mg | ORAL_TABLET | Freq: Every day | ORAL | Status: DC

## 2011-09-12 NOTE — Patient Instructions (Signed)
Limit your sodium (Salt) intake    It is important that you exercise regularly, at least 20 minutes 3 to 4 times per week.  If you develop chest pain or shortness of breath seek  medical attention.  Smoking tobacco is very bad for your health. You should stop smoking immediately.  Return in one year for follow-up  

## 2011-09-12 NOTE — Progress Notes (Signed)
Subjective:    Patient ID: Brandon [name], male    DOB: 1955/08/29, 56 y.o.   MRN: [id number]  HPI 56 year old patient who is in today for an annual exam. Medical problems include coronary artery disease which has been stable. His main complaint is low back pain. He is scheduled for an MRI later this week and orthopedic followup next week. He complains some burning dysesthesias down the left leg has worsening low back and right hip discomfort. He is followed by rheumatology for ankylosing spondylitis. History of hypertension and dyslipidemia. Unfortunately he continues to smoke one half pack of cigarettes daily.  He has history of colonic polyps and is status post followup colonoscopy last year  Past Medical History  Diagnosis Date  . BENIGN PROSTATIC HYPERTROPHY 06/07/2008  . COLONIC POLYPS, HX OF 06/07/2008  . CORONARY ARTERY DISEASE 06/07/2008  . HYPERLIPIDEMIA 06/07/2008  . HYPERTENSION 06/07/2008  . NEPHROLITHIASIS, HX OF 06/07/2008  . TOBACCO ABUSE 03/13/2009  . Arthritis     History   Social History  . Marital Status: Single    Spouse Name: N/A    Number of Children: N/A  . Years of Education: N/A   Occupational History  . Manager    Social History Main Topics  . Smoking status: Current Everyday Smoker -- 0.5 packs/day    Types: Cigarettes  . Smokeless tobacco: Never Used  . Alcohol Use: Yes  . Drug Use: No  . Sexually Active: Not on file   Other Topics Concern  . Not on file   Social History Narrative  . No narrative on file    Past Surgical History  Procedure Date  . Coronary angioplasty with stent placement   . Laminectomy     lumbar    Family History  Problem Relation Age of Onset  . Depression Father   . Arthritis Sister     RA  . Heart failure Mother     No Known Allergies  Current Outpatient Prescriptions on File Prior to Visit  Medication Sig Dispense Refill  . atenolol (TENORMIN) 50 MG tablet Take 1 tablet (50 mg total) by mouth daily.  90  tablet  6  . atorvastatin (LIPITOR) 80 MG tablet Take 1 tablet (80 mg total) by mouth daily.  90 tablet  6  . buPROPion (WELLBUTRIN SR) 150 MG 12 hr tablet Take 1 tablet (150 mg total) by mouth 2 (two) times daily.  180 tablet  6  . clopidogrel (PLAVIX) 75 MG tablet Take 1 tablet (75 mg total) by mouth daily.  90 tablet  6  . etanercept (ENBREL) 50 MG/ML injection Inject 50 mg into the skin once a week.        . sildenafil (VIAGRA) 50 MG tablet Take 1 tablet (50 mg total) by mouth daily as needed.  10 tablet  6  . Tamsulosin HCl (FLOMAX) 0.4 MG CAPS Take 1 capsule (0.4 mg total) by mouth daily.  90 capsule  5  . telmisartan-hydrochlorothiazide (MICARDIS HCT) 80-12.5 MG per tablet Take 1 tablet by mouth daily.  90 tablet  6    BP 126/80  Pulse 80  Temp(Src) 98.7 F (37.1 C) (Oral)  Resp 18  Ht 6' 0.5" (1.842 m)  Wt 196 lb (88.905 kg)  BMI 26.22 kg/m2  SpO2 97%      Review of Systems  Constitutional: Negative for fever, chills, activity change, appetite change and fatigue.  HENT: Negative for hearing loss, ear pain, congestion, rhinorrhea, sneezing, mouth sores, trouble swallowing,  neck pain, neck stiffness, dental problem, voice change, sinus pressure and tinnitus.   Eyes: Negative for photophobia, pain, redness and visual disturbance.  Respiratory: Negative for apnea, cough, choking, chest tightness, shortness of breath and wheezing.   Cardiovascular: Negative for chest pain, palpitations and leg swelling.  Gastrointestinal: Negative for nausea, vomiting, abdominal pain, diarrhea, constipation, blood in stool, abdominal distention, anal bleeding and rectal pain.  Genitourinary: Negative for dysuria, urgency, frequency, hematuria, flank pain, decreased urine volume, discharge, penile swelling, scrotal swelling, difficulty urinating, genital sores and testicular pain.  Musculoskeletal: Positive for back pain. Negative for myalgias, joint swelling, arthralgias and gait problem.  Skin:  Negative for color change, rash and wound.  Neurological: Negative for dizziness, tremors, seizures, syncope, facial asymmetry, speech difficulty, weakness, light-headedness, numbness and headaches.  Hematological: Negative for adenopathy. Does not bruise/bleed easily.  Psychiatric/Behavioral: Negative for suicidal ideas, hallucinations, behavioral problems, confusion, sleep disturbance, self-injury, dysphoric mood, decreased concentration and agitation. The patient is not nervous/anxious.        Objective:   Physical Exam  Constitutional: He appears well-developed and well-nourished.  HENT:  Head: Normocephalic and atraumatic.  Right Ear: External ear normal.  Left Ear: External ear normal.  Nose: Nose normal.  Mouth/Throat: Oropharynx is clear and moist.  Eyes: Conjunctivae and EOM are normal. Pupils are equal, round, and reactive to light. No scleral icterus.  Neck: Neck supple. No JVD present. No thyromegaly present.       Moderate decreased range of motion of cervical and lumbar spine  Cardiovascular: Normal rate, regular rhythm, normal heart sounds and intact distal pulses.  Exam reveals no gallop and no friction rub.   No murmur heard. Pulmonary/Chest: Effort normal and breath sounds normal. He exhibits no tenderness.  Abdominal: Soft. Bowel sounds are normal. He exhibits no distension and no mass. There is no tenderness.  Genitourinary: Prostate normal and penis normal.  Musculoskeletal: Normal range of motion. He exhibits no edema and no tenderness.  Lymphadenopathy:    He has no cervical adenopathy.  Neurological: He is alert. He has normal reflexes. No cranial nerve deficit. Coordination normal.  Skin: Skin is warm and dry. No rash noted.  Psychiatric: He has a normal mood and affect. His behavior is normal.          Assessment & Plan:   Preventive health examination Coronary artery disease stable Ongoing tobacco use Hypertension controlled Dyslipidemia  controlled  Follow rheumatology and orthopedics Recheck here one year Total cessation of smoking encouraged All medications refilled

## 2011-09-13 ENCOUNTER — Ambulatory Visit
Admission: RE | Admit: 2011-09-13 | Discharge: 2011-09-13 | Disposition: A | Discharge status: Home / Self Care | Payer: BC Managed Care – PPO | Source: Ambulatory Visit | Attending: Neurosurgery | Admitting: Neurosurgery

## 2011-09-13 DIAGNOSIS — M545 Low back pain, unspecified: Secondary | ICD-10-CM

## 2011-09-13 MED ORDER — GADOBENATE DIMEGLUMINE 529 MG/ML IV SOLN
18.0000 mL | Freq: Once | INTRAVENOUS | Status: AC | PRN
  Administered 2011-09-13: 18 mL via INTRAVENOUS

## 2011-10-04 ENCOUNTER — Other Ambulatory Visit: Payer: Self-pay | Admitting: Neurosurgery

## 2011-10-22 ENCOUNTER — Encounter (HOSPITAL_COMMUNITY): Payer: Self-pay | Admitting: Pharmacy Technician

## 2011-10-28 ENCOUNTER — Encounter (HOSPITAL_COMMUNITY): Payer: Self-pay | Admitting: *Deleted

## 2011-10-28 ENCOUNTER — Inpatient Hospital Stay (HOSPITAL_COMMUNITY): Admission: RE | Admit: 2011-10-28 | Discharge: 2011-10-28 | Payer: BC Managed Care – PPO | Source: Ambulatory Visit

## 2011-10-28 NOTE — Pre-Procedure Instructions (Addendum)
20 VERLE WHEELING  10/28/2011   Your procedure is scheduled on: Wednesday, June 27th.  Report to [facility] at 630 AM.  Call this number if you have problems the morning of surgery: [phone]   Remember:   Do not eat food OR DRINK ANY LIQUIDS:After Midnight.     Take these medicines the morning of surgery with A SIP OF WATER: Bupropion (Wellbutrin SR), Tamsulosin  (Flomax).  MAy take Hydrocodone- Acetaminophen (Vicodin) if needed.  Stop taking any Aspirin or NSAID products 7 days prior to surgery- stop on  Wednesdays 10/30/11  Do not wear jewelry, make-up or nail polish.  Do not wear lotions, powders, or perfumes. You may wear deodorant.  Do not shave 48 hours prior to surgery. Men may shave face and neck.  Do not bring valuables to the hospital.  Contacts, dentures or bridgework may not be worn into surgery.  Leave suitcase in the car. After surgery it may be brought to your room.  For patients admitted to the hospital, checkout time is 11:00 AM the day of discharge.   Patients discharged the day of surgery will not be allowed to drive home.  Name and phone number of your driver: NA  Special Instructions: CHG Shower Use Special Wash: 1/2 bottle night before surgery and 1/2 bottle morning of surgery.   Please read over the following fact sheets that you were given: Pain Booklet, Coughing and Deep Breathing, Blood Transfusion Information, [name] Information and Surgical Site Infection Prevention

## 2011-10-29 ENCOUNTER — Inpatient Hospital Stay (HOSPITAL_COMMUNITY): Admission: RE | Admit: 2011-10-29 | Payer: BC Managed Care – PPO | Source: Ambulatory Visit

## 2011-10-29 NOTE — Pre-Procedure Instructions (Signed)
20 YANG RACK  10/29/2011   Your procedure is scheduled on:  Wednesday November 06, 2011.  Report to [facility] at 0630 AM.  Call this number if you have problems the morning of surgery: [phone]   Remember:   Do not eat food or drink:After Midnight.     Take these medicines the morning of surgery with A SIP OF WATER: Atenolol (Tenormin), Bupropion (Wellbutrin), and Hydrocodone (Vicodin) if needed for pain   Do not wear jewelry  Do not wear lotions or cologne.   Men may shave face and neck.  Do not bring valuables to the hospital.  Contacts, dentures or bridgework may not be worn into surgery.  Leave suitcase in the car. After surgery it may be brought to your room.  For patients admitted to the hospital, checkout time is 11:00 AM the day of discharge.   Patients discharged the day of surgery will not be allowed to drive home.  Name and phone number of your driver:   Special Instructions: CHG Shower Use Special Wash: 1/2 bottle night before surgery and 1/2 bottle morning of surgery.   Please read over the following fact sheets that you were given: Pain Booklet, Coughing and Deep Breathing, Blood Transfusion Information, [name] Information and Surgical Site Infection Prevention

## 2011-10-31 ENCOUNTER — Encounter: Payer: Self-pay | Admitting: Cardiology

## 2011-10-31 ENCOUNTER — Ambulatory Visit (INDEPENDENT_AMBULATORY_CARE_PROVIDER_SITE_OTHER): Payer: BC Managed Care – PPO | Admitting: Cardiology

## 2011-10-31 VITALS — BP 143/88 | HR 87 | Ht 72.0 in | Wt 191.0 lb

## 2011-10-31 DIAGNOSIS — I10 Essential (primary) hypertension: Secondary | ICD-10-CM

## 2011-10-31 DIAGNOSIS — R011 Cardiac murmur, unspecified: Secondary | ICD-10-CM | POA: Insufficient documentation

## 2011-10-31 DIAGNOSIS — F17200 Nicotine dependence, unspecified, uncomplicated: Secondary | ICD-10-CM

## 2011-10-31 DIAGNOSIS — Z01810 Encounter for preprocedural cardiovascular examination: Secondary | ICD-10-CM | POA: Insufficient documentation

## 2011-10-31 DIAGNOSIS — I2510 Atherosclerotic heart disease of native coronary artery without angina pectoris: Secondary | ICD-10-CM

## 2011-10-31 DIAGNOSIS — E785 Hyperlipidemia, unspecified: Secondary | ICD-10-CM

## 2011-10-31 NOTE — Assessment & Plan Note (Signed)
Patient counseled on discontinuing. 

## 2011-10-31 NOTE — Assessment & Plan Note (Signed)
Continue present blood pressure medications. 

## 2011-10-31 NOTE — Assessment & Plan Note (Signed)
Continue statin. 

## 2011-10-31 NOTE — Assessment & Plan Note (Signed)
Plan continue aspirin but discontinue Plavix. Continue statin. 

## 2011-10-31 NOTE — Progress Notes (Signed)
HPI: 56 year old male with past medical history of coronary artery disease for preoperative evaluation prior to back surgery. Patient is status post non-ST elevation myocardial infarction in October of 2005. The patient had a drug-eluting stent to his mid LAD with jailing of a first diagonal. LV function was normal. Last Myoview in June 2006 showed no ischemia and normal LV function. Patient denies dyspnea on exertion, orthopnea, PND, pedal edema, palpitations, syncope or exertional chest pain. He is scheduled to have back surgery and we were asked to evaluate preoperatively. His Plavix and aspirin have been discontinued in anticipation for surgery.  Current Outpatient Prescriptions  Medication Sig Dispense Refill  . atenolol (TENORMIN) 50 MG tablet Take 1 tablet (50 mg total) by mouth daily.  90 tablet  6  . atorvastatin (LIPITOR) 80 MG tablet Take 1 tablet (80 mg total) by mouth daily.  90 tablet  6  . buPROPion (WELLBUTRIN SR) 150 MG 12 hr tablet Take 1 tablet (150 mg total) by mouth 2 (two) times daily.  180 tablet  6  . etanercept (ENBREL) 50 MG/ML injection Inject 50 mg into the skin once a week. Sunday       . HYDROcodone-acetaminophen (VICODIN) 5-500 MG per tablet Take 1 tablet by mouth every 8 (eight) hours as needed. For pain      . Multiple Vitamin (MULTIVITAMIN WITH MINERALS) TABS Take 1 tablet by mouth daily.      . sildenafil (VIAGRA) 50 MG tablet Take 1 tablet (50 mg total) by mouth daily as needed.  10 tablet  6  . Tamsulosin HCl (FLOMAX) 0.4 MG CAPS Take 1 capsule (0.4 mg total) by mouth daily.  90 capsule  5  . telmisartan-hydrochlorothiazide (MICARDIS HCT) 80-12.5 MG per tablet Take 1 tablet by mouth daily.  90 tablet  6    No Known Allergies  Past Medical History  Diagnosis Date  . BENIGN PROSTATIC HYPERTROPHY 06/07/2008  . COLONIC POLYPS, HX OF 06/07/2008  . CORONARY ARTERY DISEASE 06/07/2008  . HYPERLIPIDEMIA 06/07/2008  . HYPERTENSION 06/07/2008  . NEPHROLITHIASIS, HX OF  06/07/2008  . Ankylosing spondylitis   . Myocardial infarction   . H/O hiatal hernia     Past Surgical History  Procedure Date  . Laminectomy     lumbar  . Coronary angioplasty with stent placement     Stent- approx 2008  . Tonsillectomy     History   Social History  . Marital Status: Single    Spouse Name: N/A    Number of Children: N/A  . Years of Education: N/A   Occupational History  . Manager    Social History Main Topics  . Smoking status: Current Everyday Smoker -- 0.1 packs/day for 40 years    Types: Cigarettes  . Smokeless tobacco: Never Used  . Alcohol Use: 9.0 oz/week    15 Cans of beer per week  . Drug Use: No  . Sexually Active: Not on file   Other Topics Concern  . Not on file   Social History Narrative  . No narrative on file    Family History  Problem Relation Age of Onset  . Depression Father   . Arthritis Sister     RA  . Heart failure Mother   . Other Neg Hx     ROS: some leg cramps and low back pain but no fevers or chills, productive cough, hemoptysis, dysphasia, odynophagia, melena, hematochezia, dysuria, hematuria, rash, seizure activity, orthopnea, PND, pedal edema, claudication. Remaining systems are negative.  Physical Exam:  Blood pressure 143/88, pulse 87, height 6' (1.829 m), weight 86.637 kg (191 lb).  General:  Well developed/well nourished in NAD Skin warm/dry Patient not depressed No peripheral clubbing Back-normal HEENT-normal/normal eyelids Neck supple/normal carotid upstroke bilaterally; no bruits; no JVD; no thyromegaly chest - CTA/ normal expansion CV - RRR/normal S1 and S2; no rubs or gallops;  PMI nondisplaced, 2/6 systolic murmur apex. Abdomen -NT/ND, no HSM, no mass, + bowel sounds, no bruit 2+ femoral pulses, no bruits Ext-no edema, chords, 2+ DP Neuro-grossly nonfocal  ECG sinus rhythm at a rate of 87. No ST changes.

## 2011-10-31 NOTE — Patient Instructions (Addendum)
Your physician wants you to follow-up in: ONE YEAR WITH DR Jens Som [place] You will receive a reminder letter in the mail two months in advance. If you don't receive a letter, please call our office to schedule the follow-up appointment.   Your physician has requested that you have en exercise stress myoview. For further information please visit [email or web address] Please follow instruction sheet, as given.   Your physician has requested that you have an echocardiogram. Echocardiography is a painless test that uses sound waves to create images of your heart. It provides your doctor with information about the size and shape of your heart and how well your heart's chambers and valves are working. This procedure takes approximately one hour. There are no restrictions for this procedure.   STOP PLAVIX

## 2011-10-31 NOTE — Assessment & Plan Note (Signed)
Plan Myoview preoperatively for risk stratification. Given history of drug-eluting stent I would like to continue aspirin 81 mg preoperatively if possible. I do not think we need to resume his Plavix.

## 2011-10-31 NOTE — Assessment & Plan Note (Signed)
Schedule echocardiogram to evaluate.

## 2011-11-04 ENCOUNTER — Ambulatory Visit (HOSPITAL_COMMUNITY): Payer: BC Managed Care – PPO | Attending: Cardiology | Admitting: Radiology

## 2011-11-04 VITALS — BP 114/79 | Ht 72.0 in | Wt 192.0 lb

## 2011-11-04 DIAGNOSIS — R0989 Other specified symptoms and signs involving the circulatory and respiratory systems: Secondary | ICD-10-CM | POA: Insufficient documentation

## 2011-11-04 DIAGNOSIS — R011 Cardiac murmur, unspecified: Secondary | ICD-10-CM | POA: Insufficient documentation

## 2011-11-04 DIAGNOSIS — Z01810 Encounter for preprocedural cardiovascular examination: Secondary | ICD-10-CM

## 2011-11-04 DIAGNOSIS — R5383 Other fatigue: Secondary | ICD-10-CM | POA: Insufficient documentation

## 2011-11-04 DIAGNOSIS — R5381 Other malaise: Secondary | ICD-10-CM | POA: Insufficient documentation

## 2011-11-04 DIAGNOSIS — I252 Old myocardial infarction: Secondary | ICD-10-CM | POA: Insufficient documentation

## 2011-11-04 DIAGNOSIS — R0609 Other forms of dyspnea: Secondary | ICD-10-CM | POA: Insufficient documentation

## 2011-11-04 DIAGNOSIS — I2510 Atherosclerotic heart disease of native coronary artery without angina pectoris: Secondary | ICD-10-CM | POA: Insufficient documentation

## 2011-11-04 DIAGNOSIS — E785 Hyperlipidemia, unspecified: Secondary | ICD-10-CM | POA: Insufficient documentation

## 2011-11-04 DIAGNOSIS — F17200 Nicotine dependence, unspecified, uncomplicated: Secondary | ICD-10-CM | POA: Insufficient documentation

## 2011-11-04 DIAGNOSIS — I10 Essential (primary) hypertension: Secondary | ICD-10-CM | POA: Insufficient documentation

## 2011-11-04 MED ORDER — TECHNETIUM TC 99M TETROFOSMIN IV KIT
10.0000 | PACK | Freq: Once | INTRAVENOUS | Status: AC | PRN
  Administered 2011-11-04: 10 via INTRAVENOUS

## 2011-11-04 MED ORDER — TECHNETIUM TC 99M TETROFOSMIN IV KIT
30.0000 | PACK | Freq: Once | INTRAVENOUS | Status: AC | PRN
  Administered 2011-11-04: 30 via INTRAVENOUS

## 2011-11-04 NOTE — Progress Notes (Signed)
[facility] SITE 3 NUCLEAR MED [address] [place] [address] [phone]  Cardiology Nuclear Med Study  Brandon [name] is a 56 y.o. male     MRN : [id number]     DOB: [birth year]  Procedure Date: 11/04/2011  Nuclear Med Background Indication for Stress Test:  Evaluation for Ischemia, Stent Patency and PTCA Patency History:  '05 MI: Heart Cath: EF: 55% LAD stent (+) N/O CAD-Angioplasty/Stents: LAD, '06 MPS: NL (-) ischemia 2/06 Systolic murmur Cardiac Risk Factors: Hypertension, Lipids and Smoker  Symptoms:  DOE and Fatigue   Nuclear Pre-Procedure Caffeine/Decaff Intake:  None NPO After: 8:00pm   Lungs:  clear O2 Sat: 97% on room air. IV 0.9% NS with Angio Cath:  20g  IV Site: R Forearm  IV Started by:  Stanton Kidney, EMT-P  Chest Size (in):  42 Cup Size: n/a  Height: 6' (1.829 m)  Weight:  192 lb (87.091 kg)  BMI:  Body mass index is 26.04 kg/(m^2). Tech Comments:  Atenolol held > 24 hours, per patient.    Nuclear Med Study 1 or 2 day study: 1 day  Stress Test Type:  Stress  Reading MD: Olga Millers, MD  Order Authorizing Provider:  B.Slayton Lubitz  Resting Radionuclide: Technetium 44m Tetrofosmin  Resting Radionuclide Dose: 10.2 mCi   Stress Radionuclide:  Technetium 35m Tetrofosmin  Stress Radionuclide Dose: 31.9 mCi           Stress Protocol Rest HR: 99 Stress HR: 148  Rest BP: 114/79 Stress BP: 163/90  Exercise Time (min): 6:35 METS: 7.90   Predicted Max HR: 162 bpm % Max HR: 91.36 bpm Rate Pressure Product: 21308   Dose of Adenosine (mg):  n/a Dose of Lexiscan: n/a mg  Dose of Atropine (mg): n/a Dose of Dobutamine: n/a mcg/kg/min (at max HR)  Stress Test Technologist: Milana Na, EMT-P  Nuclear Technologist:  Doyne Keel, CNMT     Rest Procedure:  Myocardial perfusion imaging was performed at rest 45 minutes following the intravenous administration of Technetium 53m Tetrofosmin. Rest ECG: NSR - Normal EKG  Stress Procedure:  The  patient performed treadmill exercise using a Bruce  Protocol for 6:35 minutes. The patient stopped due to sob, fatigue, and denied any chest pain.  There were no significant ST-T wave changes.  Technetium 73m Tetrofosmin was injected at peak exercise and myocardial perfusion imaging was performed after a brief delay. Stress ECG: No significant change from baseline ECG  QPS Raw Data Images:  Acquisition technically good; normal left ventricular size. Stress Images:  There is decreased uptake in the anterolateral wall. Rest Images:  There is decreased uptake in the anterolateral wall. Subtraction (SDS):  There is a fixed defect that is most consistent with a previous infarction. Transient Ischemic Dilatation (Normal <1.22):  1.03 Lung/Heart Ratio (Normal <0.45):  0.26  Quantitative Gated Spect Images QGS EDV:  75 ml QGS ESV:  24 ml  Impression Exercise Capacity:  Fair exercise capacity. BP Response:  Normal blood pressure response. Clinical Symptoms:  There is dyspnea. ECG Impression:  No significant ST segment change suggestive of ischemia. Comparison with Prior Nuclear Study: No images to compare  Overall Impression:  Abnormal stress nuclear study with a small, mild, fixed anterolateral wall defect consistent with small previous infarct; no ischemia.  LV Ejection Fraction: 68%.  LV Wall Motion:  NL LV Function; NL Wall Motion   Olga Millers

## 2011-11-05 ENCOUNTER — Ambulatory Visit (HOSPITAL_COMMUNITY): Payer: BC Managed Care – PPO | Attending: Internal Medicine

## 2011-11-05 ENCOUNTER — Encounter: Payer: Self-pay | Admitting: *Deleted

## 2011-11-05 DIAGNOSIS — E785 Hyperlipidemia, unspecified: Secondary | ICD-10-CM | POA: Insufficient documentation

## 2011-11-05 DIAGNOSIS — Z01810 Encounter for preprocedural cardiovascular examination: Secondary | ICD-10-CM

## 2011-11-05 DIAGNOSIS — F17200 Nicotine dependence, unspecified, uncomplicated: Secondary | ICD-10-CM | POA: Insufficient documentation

## 2011-11-05 DIAGNOSIS — I2510 Atherosclerotic heart disease of native coronary artery without angina pectoris: Secondary | ICD-10-CM | POA: Insufficient documentation

## 2011-11-05 DIAGNOSIS — I10 Essential (primary) hypertension: Secondary | ICD-10-CM | POA: Insufficient documentation

## 2011-11-05 DIAGNOSIS — I080 Rheumatic disorders of both mitral and aortic valves: Secondary | ICD-10-CM | POA: Insufficient documentation

## 2011-11-05 DIAGNOSIS — I252 Old myocardial infarction: Secondary | ICD-10-CM | POA: Insufficient documentation

## 2011-11-05 DIAGNOSIS — R011 Cardiac murmur, unspecified: Secondary | ICD-10-CM | POA: Insufficient documentation

## 2011-11-05 DIAGNOSIS — I079 Rheumatic tricuspid valve disease, unspecified: Secondary | ICD-10-CM | POA: Insufficient documentation

## 2011-11-05 NOTE — Progress Notes (Signed)
Echocardiogram performed.  

## 2011-11-06 ENCOUNTER — Encounter (HOSPITAL_COMMUNITY): Admission: RE | Payer: Self-pay | Source: Ambulatory Visit

## 2011-11-06 ENCOUNTER — Ambulatory Visit (HOSPITAL_COMMUNITY): Admission: RE | Admit: 2011-11-06 | Payer: BC Managed Care – PPO | Source: Ambulatory Visit | Admitting: Neurosurgery

## 2011-11-06 HISTORY — DX: Personal history of other diseases of the digestive system: Z87.19

## 2011-11-06 HISTORY — DX: Acute myocardial infarction, unspecified: I21.9

## 2011-11-06 SURGERY — POSTERIOR LUMBAR FUSION 1 LEVEL
Anesthesia: General | Site: Back

## 2011-12-20 ENCOUNTER — Other Ambulatory Visit: Payer: Self-pay | Admitting: Neurosurgery

## 2012-01-12 HISTORY — PX: LUMBAR FUSION: SHX111

## 2012-01-21 ENCOUNTER — Encounter (HOSPITAL_COMMUNITY): Payer: Self-pay | Admitting: Pharmacy Technician

## 2012-01-21 NOTE — Pre-Procedure Instructions (Signed)
20 EERO DINI  01/21/2012   Your procedure is scheduled on:  Friday January 31, 2012.  Report to [facility] at 0830 AM.  Call this number if you have problems the morning of surgery: [phone]   Remember:   Do not eat food or drink:After Midnight.    Take these medicines the morning of surgery with A SIP OF WATER: Atenolol (Tenormin), Hydrocodone (Vicodin) if needed for pain   Do not wear jewelry  Do not wear lotions or colognes.  Men may shave face and neck.  Do not bring valuables to the hospital.  Contacts, dentures or bridgework may not be worn into surgery.  Leave suitcase in the car. After surgery it may be brought to your room.  For patients admitted to the hospital, checkout time is 11:00 AM the day of discharge.   Patients discharged the day of surgery will not be allowed to drive home.  Name and phone number of your driver:   Special Instructions: CHG Shower Use Special Wash: 1/2 bottle night before surgery and 1/2 bottle morning of surgery.   Please read over the following fact sheets that you were given: Pain Booklet, Coughing and Deep Breathing, Blood Transfusion Information, [name] Information and Surgical Site Infection Prevention

## 2012-01-22 ENCOUNTER — Encounter (HOSPITAL_COMMUNITY)
Admission: RE | Admit: 2012-01-22 | Discharge: 2012-01-22 | Disposition: A | Discharge status: Home / Self Care | Payer: BC Managed Care – PPO | Source: Ambulatory Visit | Attending: Neurosurgery | Admitting: Neurosurgery

## 2012-01-22 ENCOUNTER — Encounter (HOSPITAL_COMMUNITY): Payer: Self-pay

## 2012-01-22 HISTORY — DX: Cardiac murmur, unspecified: R01.1

## 2012-01-22 LAB — TYPE AND SCREEN
ABO/RH(D): O NEG
Antibody Screen: NEGATIVE

## 2012-01-22 LAB — CBC
HCT: 49.5 % (ref 39.0–52.0)
Hemoglobin: 17.4 g/dL — ABNORMAL HIGH (ref 13.0–17.0)
MCH: 31.7 pg (ref 26.0–34.0)
MCHC: 35.2 g/dL (ref 30.0–36.0)
MCV: 90.2 fL (ref 78.0–100.0)
Platelets: 200 10*3/uL (ref 150–400)
RBC: 5.49 MIL/uL (ref 4.22–5.81)
RDW: 13.2 % (ref 11.5–15.5)
WBC: 10.9 10*3/uL — ABNORMAL HIGH (ref 4.0–10.5)

## 2012-01-22 LAB — ABO/RH: ABO/RH(D): O NEG

## 2012-01-22 LAB — BASIC METABOLIC PANEL
BUN: 13 mg/dL (ref 6–23)
CO2: 30 mEq/L (ref 19–32)
Calcium: 10.3 mg/dL (ref 8.4–10.5)
Chloride: 98 mEq/L (ref 96–112)
Creatinine, Ser: 0.8 mg/dL (ref 0.50–1.35)
GFR calc Af Amer: >90 mL/min (ref >90)
GFR calc non Af Amer: >90 mL/min (ref >90)
Glucose, Bld: 109 mg/dL — ABNORMAL HIGH (ref 70–99)
Potassium: 4.2 mEq/L (ref 3.5–5.1)
Sodium: 136 mEq/L (ref 135–145)

## 2012-01-22 LAB — SURGICAL PCR SCREEN
MRSA, PCR: NEGATIVE
Staphylococcus aureus: NEGATIVE

## 2012-01-22 NOTE — Progress Notes (Signed)
Patient informed Nurse that he had a stress test with Dr. Jens Som on 11/04/11 and Echo on 11/05/11. Results in EPIC. PCP is Dr. Amador Cunas LOV in [facility]. Patient denied having a sleep study.

## 2012-01-29 MED ORDER — DEXAMETHASONE SODIUM PHOSPHATE 10 MG/ML IJ SOLN
10.0000 mg | INTRAMUSCULAR | Status: DC
  Filled 2012-01-29: qty 1

## 2012-01-29 MED ORDER — CEFAZOLIN SODIUM-DEXTROSE 2-3 GM-% IV SOLR
2.0000 g | INTRAVENOUS | Status: DC
  Filled 2012-01-29: qty 50

## 2012-01-29 NOTE — Progress Notes (Signed)
LVM to call PAT # about time change from 1035 to 3:00 so he would need to come in at 1:00. Will have late PAT RN follow up.

## 2012-01-30 ENCOUNTER — Encounter (HOSPITAL_COMMUNITY)
Admission: RE | Disposition: A | Discharge status: Home / Self Care | Payer: Self-pay | Source: Ambulatory Visit | Attending: Neurosurgery

## 2012-01-30 ENCOUNTER — Ambulatory Visit (HOSPITAL_COMMUNITY): Payer: BC Managed Care – PPO

## 2012-01-30 ENCOUNTER — Encounter (HOSPITAL_COMMUNITY): Payer: Self-pay | Admitting: *Deleted

## 2012-01-30 ENCOUNTER — Encounter (HOSPITAL_COMMUNITY): Payer: Self-pay | Admitting: Anesthesiology

## 2012-01-30 ENCOUNTER — Ambulatory Visit (HOSPITAL_COMMUNITY): Payer: BC Managed Care – PPO | Admitting: Anesthesiology

## 2012-01-30 ENCOUNTER — Inpatient Hospital Stay (HOSPITAL_COMMUNITY)
Admission: RE | Admit: 2012-01-30 | Discharge: 2012-02-02 | DRG: 756 | Disposition: A | Discharge status: Home / Self Care | Payer: BC Managed Care – PPO | Source: Ambulatory Visit | Attending: Neurosurgery | Admitting: Neurosurgery

## 2012-01-30 DIAGNOSIS — Z9861 Coronary angioplasty status: Secondary | ICD-10-CM

## 2012-01-30 DIAGNOSIS — I10 Essential (primary) hypertension: Secondary | ICD-10-CM | POA: Diagnosis present

## 2012-01-30 DIAGNOSIS — N400 Enlarged prostate without lower urinary tract symptoms: Secondary | ICD-10-CM | POA: Diagnosis present

## 2012-01-30 DIAGNOSIS — M5137 Other intervertebral disc degeneration, lumbosacral region: Principal | ICD-10-CM | POA: Diagnosis present

## 2012-01-30 DIAGNOSIS — M47816 Spondylosis without myelopathy or radiculopathy, lumbar region: Secondary | ICD-10-CM

## 2012-01-30 DIAGNOSIS — F17200 Nicotine dependence, unspecified, uncomplicated: Secondary | ICD-10-CM | POA: Diagnosis present

## 2012-01-30 DIAGNOSIS — E785 Hyperlipidemia, unspecified: Secondary | ICD-10-CM | POA: Diagnosis present

## 2012-01-30 DIAGNOSIS — M5126 Other intervertebral disc displacement, lumbar region: Secondary | ICD-10-CM | POA: Diagnosis present

## 2012-01-30 DIAGNOSIS — I2510 Atherosclerotic heart disease of native coronary artery without angina pectoris: Secondary | ICD-10-CM | POA: Diagnosis present

## 2012-01-30 DIAGNOSIS — M51379 Other intervertebral disc degeneration, lumbosacral region without mention of lumbar back pain or lower extremity pain: Principal | ICD-10-CM | POA: Diagnosis present

## 2012-01-30 SURGERY — POSTERIOR LUMBAR FUSION 1 LEVEL
Anesthesia: General | Site: Back | Wound class: Clean

## 2012-01-30 MED ORDER — GLYCOPYRROLATE 0.2 MG/ML IJ SOLN
INTRAMUSCULAR | Status: DC | PRN
  Administered 2012-01-30: 0.6 mg via INTRAVENOUS

## 2012-01-30 MED ORDER — CEFAZOLIN SODIUM-DEXTROSE 2-3 GM-% IV SOLR
INTRAVENOUS | Status: DC | PRN
  Administered 2012-01-30: 2 g via INTRAVENOUS

## 2012-01-30 MED ORDER — SODIUM CHLORIDE 0.9 % IJ SOLN: 9.0000 mL | INTRAMUSCULAR | Status: DC | PRN

## 2012-01-30 MED ORDER — HYDROMORPHONE HCL PF 1 MG/ML IJ SOLN
INTRAMUSCULAR | Status: AC
  Administered 2012-01-30: 0.5 mg via INTRAVENOUS
  Filled 2012-01-30: qty 1

## 2012-01-30 MED ORDER — ATORVASTATIN CALCIUM 80 MG PO TABS
80.0000 mg | ORAL_TABLET | Freq: Every day | ORAL | Status: DC
  Administered 2012-01-31 – 2012-02-02 (×3): 80 mg via ORAL
  Filled 2012-01-30 (×3): qty 1

## 2012-01-30 MED ORDER — VECURONIUM BROMIDE 10 MG IV SOLR
INTRAVENOUS | Status: DC | PRN
  Administered 2012-01-30: 4 mg via INTRAVENOUS
  Administered 2012-01-30: 2 mg via INTRAVENOUS
  Administered 2012-01-30: 4 mg via INTRAVENOUS

## 2012-01-30 MED ORDER — ONDANSETRON HCL 4 MG/2ML IJ SOLN: 4.0000 mg | Freq: Four times a day (QID) | INTRAMUSCULAR | Status: DC | PRN

## 2012-01-30 MED ORDER — ASPIRIN EC 325 MG PO TBEC: 325.0000 mg | DELAYED_RELEASE_TABLET | Freq: Every day | ORAL | Status: DC

## 2012-01-30 MED ORDER — DEXAMETHASONE SODIUM PHOSPHATE 10 MG/ML IJ SOLN
INTRAMUSCULAR | Status: DC | PRN
  Administered 2012-01-30: 10 mg via INTRAVENOUS

## 2012-01-30 MED ORDER — FENTANYL CITRATE 0.05 MG/ML IJ SOLN
INTRAMUSCULAR | Status: DC | PRN
  Administered 2012-01-30 (×4): 100 ug via INTRAVENOUS
  Administered 2012-01-30: 50 ug via INTRAVENOUS
  Administered 2012-01-30 (×2): 100 ug via INTRAVENOUS

## 2012-01-30 MED ORDER — 0.9 % SODIUM CHLORIDE (POUR BTL) OPTIME
TOPICAL | Status: DC | PRN
  Administered 2012-01-30: 1000 mL

## 2012-01-30 MED ORDER — MIDAZOLAM HCL 5 MG/5ML IJ SOLN
INTRAMUSCULAR | Status: DC | PRN
  Administered 2012-01-30 (×2): 2 mg via INTRAVENOUS

## 2012-01-30 MED ORDER — BACITRACIN 50000 UNITS IM SOLR
INTRAMUSCULAR | Status: AC
  Filled 2012-01-30: qty 1

## 2012-01-30 MED ORDER — LIDOCAINE HCL (CARDIAC) 20 MG/ML IV SOLN
INTRAVENOUS | Status: DC | PRN
  Administered 2012-01-30: 80 mg via INTRAVENOUS

## 2012-01-30 MED ORDER — NEOSTIGMINE METHYLSULFATE 1 MG/ML IJ SOLN
INTRAMUSCULAR | Status: DC | PRN
  Administered 2012-01-30: 4 mg via INTRAVENOUS

## 2012-01-30 MED ORDER — CYCLOBENZAPRINE HCL 10 MG PO TABS
10.0000 mg | ORAL_TABLET | Freq: Three times a day (TID) | ORAL | Status: DC | PRN
  Administered 2012-01-31: 10 mg via ORAL
  Filled 2012-01-30: qty 1

## 2012-01-30 MED ORDER — ONDANSETRON HCL 4 MG/2ML IJ SOLN
INTRAMUSCULAR | Status: DC | PRN
  Administered 2012-01-30: 4 mg via INTRAVENOUS

## 2012-01-30 MED ORDER — SODIUM CHLORIDE 0.9 % IV SOLN: 250.0000 mL | INTRAVENOUS | Status: DC

## 2012-01-30 MED ORDER — PHENYLEPHRINE HCL 10 MG/ML IJ SOLN
20.0000 mg | INTRAVENOUS | Status: DC | PRN
  Administered 2012-01-30: 25 ug/min via INTRAVENOUS

## 2012-01-30 MED ORDER — MENTHOL 3 MG MT LOZG: 1.0000 | LOZENGE | OROMUCOSAL | Status: DC | PRN

## 2012-01-30 MED ORDER — ATENOLOL 50 MG PO TABS
50.0000 mg | ORAL_TABLET | Freq: Every day | ORAL | Status: DC
  Administered 2012-01-31 – 2012-02-02 (×3): 50 mg via ORAL
  Filled 2012-01-30 (×3): qty 1

## 2012-01-30 MED ORDER — CEFAZOLIN SODIUM 1-5 GM-% IV SOLN
1.0000 g | Freq: Three times a day (TID) | INTRAVENOUS | Status: AC
  Administered 2012-01-31 (×2): 1 g via INTRAVENOUS
  Filled 2012-01-30 (×2): qty 50

## 2012-01-30 MED ORDER — SODIUM CHLORIDE 0.9 % IJ SOLN: 3.0000 mL | INTRAMUSCULAR | Status: DC | PRN

## 2012-01-30 MED ORDER — ASPIRIN EC 325 MG PO TBEC
325.0000 mg | DELAYED_RELEASE_TABLET | Freq: Every day | ORAL | Status: DC
  Administered 2012-01-31 – 2012-02-02 (×3): 325 mg via ORAL
  Filled 2012-01-30 (×3): qty 1

## 2012-01-30 MED ORDER — ALUM & MAG HYDROXIDE-SIMETH 200-200-20 MG/5ML PO SUSP: 30.0000 mL | Freq: Four times a day (QID) | ORAL | Status: DC | PRN

## 2012-01-30 MED ORDER — HYDROCHLOROTHIAZIDE 12.5 MG PO CAPS
12.5000 mg | ORAL_CAPSULE | Freq: Every day | ORAL | Status: DC
  Administered 2012-01-31 – 2012-02-02 (×3): 12.5 mg via ORAL
  Filled 2012-01-30 (×3): qty 1

## 2012-01-30 MED ORDER — HYDROCODONE-ACETAMINOPHEN 5-325 MG PO TABS
2.0000 | ORAL_TABLET | ORAL | Status: DC | PRN
  Administered 2012-01-31 (×2): 2 via ORAL
  Filled 2012-01-30 (×2): qty 2

## 2012-01-30 MED ORDER — HEPARIN SODIUM (PORCINE) 1000 UNIT/ML IJ SOLN
INTRAMUSCULAR | Status: AC
  Filled 2012-01-30: qty 1

## 2012-01-30 MED ORDER — HYDROMORPHONE HCL PF 1 MG/ML IJ SOLN
0.2500 mg | INTRAMUSCULAR | Status: DC | PRN
  Administered 2012-01-30 (×4): 0.5 mg via INTRAVENOUS

## 2012-01-30 MED ORDER — SODIUM CHLORIDE 0.9 % IJ SOLN
3.0000 mL | Freq: Two times a day (BID) | INTRAMUSCULAR | Status: DC
  Administered 2012-01-31 – 2012-02-02 (×5): 3 mL via INTRAVENOUS

## 2012-01-30 MED ORDER — PROPOFOL 10 MG/ML IV BOLUS
INTRAVENOUS | Status: DC | PRN
  Administered 2012-01-30: 200 mg via INTRAVENOUS

## 2012-01-30 MED ORDER — HYDROMORPHONE 0.3 MG/ML IV SOLN
INTRAVENOUS | Status: AC
  Filled 2012-01-30: qty 25

## 2012-01-30 MED ORDER — SODIUM CHLORIDE 0.9 % IR SOLN
Status: DC | PRN
  Administered 2012-01-30: 17:00:00

## 2012-01-30 MED ORDER — HEMOSTATIC AGENTS (NO CHARGE) OPTIME
TOPICAL | Status: DC | PRN
  Administered 2012-01-30: 1 via TOPICAL

## 2012-01-30 MED ORDER — TAMSULOSIN HCL 0.4 MG PO CAPS
0.4000 mg | ORAL_CAPSULE | Freq: Every day | ORAL | Status: DC
  Administered 2012-01-31 – 2012-02-02 (×3): 0.4 mg via ORAL
  Filled 2012-01-30 (×3): qty 1

## 2012-01-30 MED ORDER — ETANERCEPT 50 MG/ML ~~LOC~~ SOLN: 50.0000 mg | SUBCUTANEOUS | Status: DC

## 2012-01-30 MED ORDER — PHENYLEPHRINE HCL 10 MG/ML IJ SOLN
INTRAMUSCULAR | Status: DC | PRN
  Administered 2012-01-30 (×4): 80 ug via INTRAVENOUS

## 2012-01-30 MED ORDER — ADULT MULTIVITAMIN W/MINERALS CH
1.0000 | ORAL_TABLET | Freq: Every day | ORAL | Status: DC
  Administered 2012-01-31 – 2012-02-02 (×3): 1 via ORAL
  Filled 2012-01-30 (×3): qty 1

## 2012-01-30 MED ORDER — ONDANSETRON HCL 4 MG/2ML IJ SOLN: 4.0000 mg | INTRAMUSCULAR | Status: DC | PRN

## 2012-01-30 MED ORDER — NALOXONE HCL 0.4 MG/ML IJ SOLN: 0.4000 mg | INTRAMUSCULAR | Status: DC | PRN

## 2012-01-30 MED ORDER — IRBESARTAN 300 MG PO TABS
300.0000 mg | ORAL_TABLET | Freq: Every day | ORAL | Status: DC
  Administered 2012-01-31 – 2012-02-02 (×3): 300 mg via ORAL
  Filled 2012-01-30 (×3): qty 1

## 2012-01-30 MED ORDER — HYDROMORPHONE 0.3 MG/ML IV SOLN
INTRAVENOUS | Status: DC
  Administered 2012-01-30: 21:00:00 via INTRAVENOUS
  Administered 2012-01-30: 0.5 mg via INTRAVENOUS
  Administered 2012-01-31: 03:00:00 via INTRAVENOUS
  Administered 2012-01-31: 4.1 mg via INTRAVENOUS
  Administered 2012-01-31: 3.16 mg via INTRAVENOUS
  Filled 2012-01-30: qty 25

## 2012-01-30 MED ORDER — THROMBIN 20000 UNITS EX KIT
PACK | CUTANEOUS | Status: DC | PRN
  Administered 2012-01-30: 20000 [IU] via TOPICAL

## 2012-01-30 MED ORDER — DIPHENHYDRAMINE HCL 50 MG/ML IJ SOLN: 12.5000 mg | Freq: Four times a day (QID) | INTRAMUSCULAR | Status: DC | PRN

## 2012-01-30 MED ORDER — PHENOL 1.4 % MT LIQD: 1.0000 | OROMUCOSAL | Status: DC | PRN

## 2012-01-30 MED ORDER — DIPHENHYDRAMINE HCL 12.5 MG/5ML PO ELIX: 12.5000 mg | ORAL_SOLUTION | Freq: Four times a day (QID) | ORAL | Status: DC | PRN

## 2012-01-30 MED ORDER — ACETAMINOPHEN 325 MG PO TABS
650.0000 mg | ORAL_TABLET | ORAL | Status: DC | PRN
  Administered 2012-02-01: 650 mg via ORAL
  Filled 2012-01-30: qty 2

## 2012-01-30 MED ORDER — LACTATED RINGERS IV SOLN
INTRAVENOUS | Status: DC | PRN
  Administered 2012-01-30 (×4): via INTRAVENOUS

## 2012-01-30 MED ORDER — TELMISARTAN-HCTZ 80-12.5 MG PO TABS: 1.0000 | ORAL_TABLET | Freq: Every day | ORAL | Status: DC

## 2012-01-30 MED ORDER — LIDOCAINE-EPINEPHRINE 1 %-1:100000 IJ SOLN
INTRAMUSCULAR | Status: DC | PRN
  Administered 2012-01-30: 10 mL

## 2012-01-30 MED ORDER — BUPIVACAINE HCL (PF) 0.25 % IJ SOLN
INTRAMUSCULAR | Status: DC | PRN
  Administered 2012-01-30: 10 mL

## 2012-01-30 MED ORDER — ACETAMINOPHEN 650 MG RE SUPP: 650.0000 mg | RECTAL | Status: DC | PRN

## 2012-01-30 MED ORDER — SODIUM CHLORIDE 0.9 % IV SOLN
INTRAVENOUS | Status: AC
  Filled 2012-01-30: qty 500

## 2012-01-30 MED ORDER — ROCURONIUM BROMIDE 100 MG/10ML IV SOLN
INTRAVENOUS | Status: DC | PRN
  Administered 2012-01-30: 50 mg via INTRAVENOUS

## 2012-01-30 MED ORDER — DOCUSATE SODIUM 100 MG PO CAPS
100.0000 mg | ORAL_CAPSULE | Freq: Two times a day (BID) | ORAL | Status: DC
  Administered 2012-01-30 – 2012-02-02 (×6): 100 mg via ORAL
  Filled 2012-01-30 (×4): qty 1

## 2012-01-30 SURGICAL SUPPLY — 69 items
BAG DECANTER FOR FLEXI CONT (MISCELLANEOUS) ×2 IMPLANT
BENZOIN TINCTURE PRP APPL 2/3 (GAUZE/BANDAGES/DRESSINGS) ×2 IMPLANT
BLADE SURG 11 STRL SS (BLADE) ×2 IMPLANT
BLADE SURG ROTATE 9660 (MISCELLANEOUS) IMPLANT
BRUSH SCRUB EZ PLAIN DRY (MISCELLANEOUS) ×2 IMPLANT
BUR MATCHSTICK NEURO 3.0 LAGG (BURR) ×2 IMPLANT
BUR PRECISION FLUTE 6.0 (BURR) ×2 IMPLANT
CANISTER SUCTION 2500CC (MISCELLANEOUS) ×2 IMPLANT
CAP LOCKING REVERE (Cap) ×8 IMPLANT
CLOTH BEACON ORANGE TIMEOUT ST (SAFETY) ×2 IMPLANT
CONT SPEC 4OZ CLIKSEAL STRL BL (MISCELLANEOUS) ×4 IMPLANT
COVER BACK TABLE 24X17X13 BIG (DRAPES) IMPLANT
COVER TABLE BACK 60X90 (DRAPES) ×2 IMPLANT
DECANTER SPIKE VIAL GLASS SM (MISCELLANEOUS) ×2 IMPLANT
DERMABOND ADVANCED (GAUZE/BANDAGES/DRESSINGS) ×1
DERMABOND ADVANCED .7 DNX12 (GAUZE/BANDAGES/DRESSINGS) ×1 IMPLANT
DRAPE C-ARM 42X72 X-RAY (DRAPES) ×4 IMPLANT
DRAPE LAPAROTOMY 100X72X124 (DRAPES) ×2 IMPLANT
DRAPE POUCH INSTRU U-SHP 10X18 (DRAPES) ×2 IMPLANT
DRAPE PROXIMA HALF (DRAPES) IMPLANT
DRAPE SURG 17X23 STRL (DRAPES) ×2 IMPLANT
DRSG OPSITE 4X5.5 SM (GAUZE/BANDAGES/DRESSINGS) ×2 IMPLANT
ELECT REM PT RETURN 9FT ADLT (ELECTROSURGICAL) ×2
ELECTRODE REM PT RTRN 9FT ADLT (ELECTROSURGICAL) ×1 IMPLANT
EVACUATOR 3/16  PVC DRAIN (DRAIN) ×1
EVACUATOR 3/16 PVC DRAIN (DRAIN) ×1 IMPLANT
GAUZE SPONGE 4X4 16PLY XRAY LF (GAUZE/BANDAGES/DRESSINGS) IMPLANT
GLOVE BIO SURGEON STRL SZ 6.5 (GLOVE) ×10 IMPLANT
GLOVE BIO SURGEON STRL SZ8 (GLOVE) ×6 IMPLANT
GLOVE BIOGEL PI IND STRL 6.5 (GLOVE) ×3 IMPLANT
GLOVE BIOGEL PI INDICATOR 6.5 (GLOVE) ×3
GLOVE ECLIPSE 7.5 STRL STRAW (GLOVE) ×2 IMPLANT
GLOVE EXAM NITRILE LRG STRL (GLOVE) IMPLANT
GLOVE EXAM NITRILE MD LF STRL (GLOVE) IMPLANT
GLOVE EXAM NITRILE XL STR (GLOVE) IMPLANT
GLOVE EXAM NITRILE XS STR PU (GLOVE) IMPLANT
GLOVE INDICATOR 6.5 STRL GRN (GLOVE) ×2 IMPLANT
GLOVE INDICATOR 8.5 STRL (GLOVE) ×6 IMPLANT
GOWN BRE IMP SLV AUR LG STRL (GOWN DISPOSABLE) ×4 IMPLANT
GOWN BRE IMP SLV AUR XL STRL (GOWN DISPOSABLE) ×8 IMPLANT
GOWN STRL REIN 2XL LVL4 (GOWN DISPOSABLE) IMPLANT
KIT BASIN OR (CUSTOM PROCEDURE TRAY) ×2 IMPLANT
KIT ROOM TURNOVER OR (KITS) ×2 IMPLANT
MILL MEDIUM DISP (BLADE) ×2 IMPLANT
NEEDLE HYPO 25X1 1.5 SAFETY (NEEDLE) ×2 IMPLANT
NEEDLE SPNL 20GX3.5 QUINCKE YW (NEEDLE) ×2 IMPLANT
NS IRRIG 1000ML POUR BTL (IV SOLUTION) ×2 IMPLANT
PACK LAMINECTOMY NEURO (CUSTOM PROCEDURE TRAY) ×2 IMPLANT
PAD ARMBOARD 7.5X6 YLW CONV (MISCELLANEOUS) ×6 IMPLANT
PUTTY BONE DBX 5CC MIX (Putty) ×2 IMPLANT
ROD CURVED 5.5MMX55MM (Rod) ×2 IMPLANT
ROD CURVED 5.5X45MM (Rod) ×2 IMPLANT
SCREW PEDICLE 6.5MMX35MM (Screw) ×4 IMPLANT
SCREW PEDICLE 6.5MMX45MM (Screw) ×4 IMPLANT
SCREW PEDICLE 6.5X45 (Screw) ×2 IMPLANT
SPACER CALIBER 10X22 9-13MM-12 (Spacer) ×4 IMPLANT
SPONGE GAUZE 4X4 12PLY (GAUZE/BANDAGES/DRESSINGS) IMPLANT
SPONGE LAP 4X18 X RAY DECT (DISPOSABLE) IMPLANT
SPONGE SURGIFOAM ABS GEL 100 (HEMOSTASIS) ×2 IMPLANT
STRIP CLOSURE SKIN 1/2X4 (GAUZE/BANDAGES/DRESSINGS) ×2 IMPLANT
SUT VIC AB 0 CT1 18XCR BRD8 (SUTURE) ×2 IMPLANT
SUT VIC AB 0 CT1 8-18 (SUTURE) ×2
SUT VIC AB 2-0 CT1 18 (SUTURE) ×2 IMPLANT
SUT VICRYL 4-0 PS2 18IN ABS (SUTURE) ×2 IMPLANT
SYR 20ML ECCENTRIC (SYRINGE) ×2 IMPLANT
TOWEL OR 17X24 6PK STRL BLUE (TOWEL DISPOSABLE) ×4 IMPLANT
TOWEL OR 17X26 10 PK STRL BLUE (TOWEL DISPOSABLE) ×2 IMPLANT
TRAY FOLEY CATH 14FRSI W/METER (CATHETERS) ×2 IMPLANT
WATER STERILE IRR 1000ML POUR (IV SOLUTION) ×2 IMPLANT

## 2012-01-30 NOTE — H&P (Signed)
Brandon [name] is an 56 y.o. male.   Chief Complaint: Back and right greater left leg pain HPI: This is the posterior Brandon [name] who underwent previous L5-S1 laminectomy discectomy initially did very well her couple years over last several months is a progress worsening back and family right leg pain rate in the back of his leg the outside of his right foot consistent with S1 nerve root pattern. We had multiple images of his back throughout the years even since surgery and we've documented progression of degenerative disc disease with a recurrent herniation displaced the left and the right S1 nerve root we failed all forms of conservative treatment with anti-inflammatory physical therapy epidural steroid injections because of this I recommended redo laminectomy and decompression and stabilization procedure at L5-S1 I went over the risks and benefits of the operation with him he understands and once to proceed forward. I also explained perioperative course and expectations of outcome alternatives of surgery.  Past Medical History  Diagnosis Date  . BENIGN PROSTATIC HYPERTROPHY 06/07/2008  . COLONIC POLYPS, HX OF 06/07/2008  . CORONARY ARTERY DISEASE 06/07/2008  . HYPERLIPIDEMIA 06/07/2008  . HYPERTENSION 06/07/2008  . NEPHROLITHIASIS, HX OF 06/07/2008  . Ankylosing spondylitis   . Myocardial infarction   . H/O hiatal hernia   . Heart murmur   . Arthritis     Past Surgical History  Procedure Date  . Laminectomy     lumbar  . Coronary angioplasty with stent placement     Stent- approx 2008  . Tonsillectomy     Family History  Problem Relation Age of Onset  . Depression Father   . Arthritis Sister     RA  . Heart failure Mother   . Other Neg Hx    Social History:  reports that he has been smoking Cigarettes.  He has a 4 pack-year smoking history. He has never used smokeless tobacco. He reports that he drinks about 9 ounces of alcohol per week. He reports that he does not use illicit  drugs.  Allergies: No Known Allergies  Medications Prior to Admission  Medication Sig Dispense Refill  . aspirin 325 MG EC tablet Take 325 mg by mouth daily.      [name] atenolol (TENORMIN) 50 MG tablet Take 1 tablet (50 mg total) by mouth daily.  90 tablet  6  . atorvastatin (LIPITOR) 80 MG tablet Take 1 tablet (80 mg total) by mouth daily.  90 tablet  6  . etanercept (ENBREL) 50 MG/ML injection Inject 50 mg into the skin once a week. Sunday       . HYDROcodone-acetaminophen (VICODIN) 5-500 MG per tablet Take 1 tablet by mouth every 8 (eight) hours as needed. For pain      . Multiple Vitamin (MULTIVITAMIN WITH MINERALS) TABS Take 1 tablet by mouth daily.      . sildenafil (VIAGRA) 50 MG tablet Take 1 tablet (50 mg total) by mouth daily as needed.  10 tablet  6  . Tamsulosin HCl (FLOMAX) 0.4 MG CAPS Take 1 capsule (0.4 mg total) by mouth daily.  90 capsule  5  . telmisartan-hydrochlorothiazide (MICARDIS HCT) 80-12.5 MG per tablet Take 1 tablet by mouth daily.  90 tablet  6    No results found for this or any previous visit (from the past 48 hour(s)). No results found.  Review of Systems  Constitutional: Negative.   HENT: Negative.   Eyes: Negative.   Respiratory: Negative.   Cardiovascular: Negative.   Gastrointestinal: Negative.  Genitourinary: Negative.   Musculoskeletal: Positive for back pain.  Skin: Negative.   Neurological: Positive for tingling.  Endo/Heme/Allergies: Negative.     Blood pressure 143/89, pulse 86, temperature 98.3 F (36.8 C), temperature source Oral, resp. rate 18, SpO2 96.00%. Physical Exam  Constitutional: He is oriented to person, place, and time. He appears well-developed and well-nourished.  HENT:  Head: Normocephalic.  Eyes: Pupils are equal, round, and reactive to light.  Neck: Normal range of motion.  Cardiovascular: Normal rate.   Respiratory: Breath sounds normal.  GI: Soft.  Neurological: He is alert and oriented to person, place, and time.  He has normal strength. GCS eye subscore is 4. GCS verbal subscore is 5. GCS motor subscore is 6.  Reflex Scores:      Patellar reflexes are 0 on the right side and 0 on the left side.      Achilles reflexes are 0 on the right side and 0 on the left side.      Strength is 5 out of 5 in his iliopsoas, quads, and she's, gaseous, anterior tibialis, EHL.     Assessment/Plan 56 year old gentleman presents for L5-S1 posterior lumbar interbody fusion  Mccormick Macon P 01/30/2012, 3:44 PM

## 2012-01-30 NOTE — Anesthesia Preprocedure Evaluation (Addendum)
Anesthesia Evaluation  Patient identified by MRN, date of birth, ID band Patient awake    Reviewed: Allergy & Precautions, H&P , NPO status , Patient's Chart, lab work & pertinent test results, reviewed documented beta blocker date and time   Airway Mallampati: II      Dental   Pulmonary Current Smoker,  breath sounds clear to auscultation        Cardiovascular Exercise Tolerance: Good hypertension, Pt. on medications and Pt. on home beta blockers + CAD and + Past MI + Valvular Problems/Murmurs AS Rhythm:Regular Rate:Normal  MI in 05, s/p LAD stent  EF - 55-65% Nl LV function Mild AS, small ant/lat scar  EKG -NSR    Neuro/Psych    GI/Hepatic Neg liver ROS, hiatal hernia, Colon polyps   Endo/Other  negative endocrine ROS  Renal/GU negative Renal ROS   BPH     Musculoskeletal negative musculoskeletal ROS (+)   Abdominal   Peds  Hematology negative hematology ROS (+)   Anesthesia Other Findings   Reproductive/Obstetrics negative OB ROS                         Anesthesia Physical Anesthesia Plan  ASA: III  Anesthesia Plan: General   Post-op Pain Management:    Induction: Intravenous  Airway Management Planned: Oral ETT  Additional Equipment:   Intra-op Plan:   Post-operative Plan: Extubation in OR  Informed Consent: I have reviewed the patients History and Physical, chart, labs and discussed the procedure including the risks, benefits and alternatives for the proposed anesthesia with the patient or authorized representative who has indicated his/her understanding and acceptance.   Dental advisory given  Plan Discussed with: CRNA and Anesthesiologist  Anesthesia Plan Comments:        Anesthesia Quick Evaluation

## 2012-01-30 NOTE — Preoperative (Signed)
Beta Blockers   Reason not to administer Beta Blockers:Not Applicable 

## 2012-01-30 NOTE — Progress Notes (Signed)
Dr. Randa Evens notified about what patient drank at 0400 and 0600.

## 2012-01-30 NOTE — Transfer of Care (Signed)
Immediate Anesthesia Transfer of Care Note  Patient: Brandon [name]  Procedure(s) Performed: Procedure(s) (LRB) with comments: POSTERIOR LUMBAR FUSION 1 LEVEL (N/A) - Posterior Lumbar Five-Sacral One Interbody and Fusion  Patient Location: PACU  Anesthesia Type: General  Level of Consciousness: awake, alert , oriented and patient cooperative  Airway & Oxygen Therapy: Patient Spontanous Breathing and Patient connected to nasal cannula oxygen  Post-op Assessment: Report given to PACU RN, Post -op Vital signs reviewed and stable and Patient moving all extremities X 4  Post vital signs: Reviewed and stable  Complications: No apparent anesthesia complications

## 2012-01-30 NOTE — Op Note (Signed)
Preoperative diagnosis: Recurrent ruptured disc lumbar spinal stenosis and degenerative disc disease L5-S1  Postoperative diagnosis: Same  Procedure: Redo decompressive lumbar laminectomy and excess will be needed with a standard interbody fusion  #2 posterior lumbar interbody fusion L5-S1 using caliber expandable peek cages packed with local autograft mixed with DBX  #3 pedicle screw fixation L5-S1 using the globus Revere pedicle screw system 5.5  #4 posterior lateral arthrodesis L5-S1 using local autograft mixed with active use  #5 placement of large Hemovac drain  Surgeon: Jillyn Hidden Amberli Ruegg  Assistant: Sharyon Cable  Anesthesia: Gen.  EBL: Less than 200  History of present illness: Patient appeared very pleasant 56 year old gentleman who has had previous laminectomy L5-S1 and has had progressive worsening back and leg pain over last several months and years patient been through epidural steroid injections anti-inflammatories physical therapy and time MRI scan showed a recurrent disc herniation and progressive degenerative disc disease L5-S1 top of his history Vancocin spondylitis with marked facet arthropathy at the superior levels but incompetency of his L5-S1 facet facet. Due to his failure conservative treatment progression of clinical syndrome and imaging findings patient recommended laminectomy and fusion I extensively reviewed the risks and benefits of the operation as well as perioperative course and expectations of outcome alternatives of surgery he understands and agrees to proceed forward to  Operative procedure: Patient was brought into the or was induced under general anesthesia positioned prone the Wilson frame his back was prepped and draped in routine sterile fashion. His old incision was opened up and extended cephalad caudally the lamina of L5 and S1 was exposed and as well as lamina of L4. Second MTP in pedicle L5 this was all performed bilaterally. Interoperative X. identify the  appropriate level is excessive scar tissue on the right in the laminotomy defect at L5-S1 this is all dissected free after the after the spinous processes at L5 was removed central decompression was begun favoring the left side where he did not have previous surgery working on the right side completely a facetectomy was performed there was extensive amount of compression of both the L5 and S1 nerve roots I do marked facet arthropathy and the bony overgrowth from his ankylosing spondylitis. L5-S1 nerve roots were identified there is trismus scar tissue around the right S1 nerve root this was teased off the S1 lamina was unroofed to the S1 nerve was decompressed flush with pedicle. This was carried on the left side as well decompressing the L5 and S1 nerve roots on the left and after adequate decompression achieved this taken to pedicle screw placement. Using a high-speed drill pilot holes were drilled on the lateral margins of the second complex at L4-5 and L5-S1 daily with the awl probed P. Probed again a 6.5 x 45 screws inserted at L5 and 6.5/35 to S1 all screws excellent purchase. After all screws in place since interbody work epidural veins are coagulated the space was incised bilaterally a size 10 distractor was inserted the patient's right side the endplates were prepped in the left and aggressive discectomies were performed and with Epstein curettes and rotating cutters endplates were prepped 89 expandable 13 mm cage was placed this was expanded up to approximately 11 mm until adequate resistance was achieved and good apposition the endplates was achieved on fluoroscopy. The distractor was removed the endplates compared to similar fashion the right side local a graft was packed centrally and another 9 x 39 expandable 13 cages inserted the right side and expanded to a similar  height. Posterior fluoroscopy confirmed good position of the implants. Then the wound scope was irrigated meticulous hemostasis was  maintained aggressive decortication was care MTPs or lateral gutters the remainder the posterior lateral bone was overlaid along the TPS and facet complexes at L4-5 and L5-S1 using the L5-S1 segment then the rods were placed top tightness tightened at S1 the L5 screws compressed against S1 Gelfoam was laid over the dura the foraminal reinspected confirm patency the wounds and copiously irrigated and closed in layers with after Vicryl after a large director was placed and a running 4 subcuticular and skin benzoin and Steri-Strips were applied patient recovered in stable condition at the M. and counts sponge counts correct.

## 2012-01-30 NOTE — Anesthesia Postprocedure Evaluation (Signed)
Anesthesia Post Note  Patient: Brandon [name]  Procedure(s) Performed: Procedure(s) (LRB): POSTERIOR LUMBAR FUSION 1 LEVEL (N/A)  Anesthesia type: general  Patient location: PACU  Post pain: Pain level controlled  Post assessment: Patient's Cardiovascular Status Stable  Last Vitals:  Filed Vitals:   01/30/12 2030  BP: 134/83  Pulse: 87  Temp:   Resp: 13    Post vital signs: Reviewed and stable  Level of consciousness: sedated  Complications: No apparent anesthesia complications

## 2012-01-31 MED ORDER — HYDROMORPHONE HCL PF 1 MG/ML IJ SOLN
1.0000 mg | INTRAMUSCULAR | Status: DC | PRN
  Administered 2012-01-31 – 2012-02-01 (×8): 1 mg via INTRAVENOUS
  Filled 2012-01-31 (×8): qty 1

## 2012-01-31 MED ORDER — PNEUMOCOCCAL VAC POLYVALENT 25 MCG/0.5ML IJ INJ
0.5000 mL | INJECTION | INTRAMUSCULAR | Status: AC
  Filled 2012-01-31: qty 0.5

## 2012-01-31 MED ORDER — PNEUMOCOCCAL 13-VAL CONJ VACC IM SUSP: 0.5000 mL | INTRAMUSCULAR | Status: DC

## 2012-01-31 MED ORDER — HYDROCODONE-ACETAMINOPHEN 5-325 MG PO TABS: 2.0000 | ORAL_TABLET | ORAL | Status: DC | PRN

## 2012-01-31 MED ORDER — INFLUENZA VIRUS VACC SPLIT PF IM SUSP
0.5000 mL | INTRAMUSCULAR | Status: AC | PRN
  Administered 2012-02-01: 0.5 mL via INTRAMUSCULAR

## 2012-01-31 MED ORDER — CYCLOBENZAPRINE HCL 10 MG PO TABS: 10.0000 mg | ORAL_TABLET | Freq: Three times a day (TID) | ORAL | Status: DC | PRN

## 2012-01-31 NOTE — Plan of Care (Signed)
Problem: Consults Goal: Diagnosis - Spinal Surgery Outcome: Progressing Thoraco/Lumbar Spine Fusion     

## 2012-01-31 NOTE — Discharge Summary (Signed)
  Physician Discharge Summary  Patient ID: Brandon [name] MRN: [id number] DOB/AGE: 1956-05-04 56 y.o.  Admit date: 01/30/2012 Discharge date: 01/31/2012  Admission Diagnoses: Degenerative disease L5-S1 recurrent disc herniation L5-S1 right Discharge Diagnoses: Same  Active Problems:  * No active hospital problems. *    Discharged Condition: good  Hospital Course: Patient is admitted hospital underwent decompressive laminectomy and fusion L5-S1 postoperative his very well to the floor over the first 20-48 hours patient progress immobilize in significant improvement preoperative leg pain was tolerating regular diet and living and voiding spontaneously and pain was well controlled on pills and will discharge him as scheduled followup in one to 2 weeks the  Consults: Significant Diagnostic Studies: Treatments: Posterior lumbar interbody fusion L5-S1 Discharge Exam: Blood pressure 136/84, pulse 88, temperature 98.2 F (36.8 C), temperature source Oral, resp. rate 16, height 6' (1.829 m), weight 89.8 kg (197 lb 15.6 oz), SpO2 97.00%. Strength out of 5 wound clean and dry  Disposition: Home     Medication List     As of 01/31/2012  7:40 AM    TAKE these medications         aspirin 325 MG EC tablet   Take 325 mg by mouth daily.      atenolol 50 MG tablet   Commonly known as: TENORMIN   Take 1 tablet (50 mg total) by mouth daily.      atorvastatin 80 MG tablet   Commonly known as: LIPITOR   Take 1 tablet (80 mg total) by mouth daily.      cyclobenzaprine 10 MG tablet   Commonly known as: FLEXERIL   Take 1 tablet (10 mg total) by mouth 3 (three) times daily as needed for muscle spasms.      ENBREL 50 MG/ML injection   Generic drug: etanercept   Inject 50 mg into the skin once a week. Sunday        HYDROcodone-acetaminophen 5-500 MG per tablet   Commonly known as: VICODIN   Take 1 tablet by mouth every 8 (eight) hours as needed. For pain      HYDROcodone-acetaminophen  5-325 MG per tablet   Commonly known as: NORCO/VICODIN   Take 2 tablets by mouth every 4 (four) hours as needed.      multivitamin with minerals Tabs   Take 1 tablet by mouth daily.      sildenafil 50 MG tablet   Commonly known as: VIAGRA   Take 1 tablet (50 mg total) by mouth daily as needed.      Tamsulosin HCl 0.4 MG Caps   Commonly known as: FLOMAX   Take 1 capsule (0.4 mg total) by mouth daily.      telmisartan-hydrochlorothiazide 80-12.5 MG per tablet   Commonly known as: MICARDIS HCT   Take 1 tablet by mouth daily.         Signed: Sahaj Bona P 01/31/2012, 7:40 AM

## 2012-01-31 NOTE — Progress Notes (Signed)
Orthopedic Tech Progress Note Patient Details:  Brandon [name] July 23, 1955 [id number]  Patient ID: Brandon [name], male   DOB: [birth year], 56 y.o.   MRN: [id number] Brace order completed by Storm Frisk, Niccolas Loeper 01/31/2012, 4:54 PM

## 2012-01-31 NOTE — Progress Notes (Signed)
Subjective: Patient reports Overall is doing very well tolerating the pain medication was given PCA will be taken out her Foley he has no leg pain his back pain is manageable  Objective: Vital signs in last 24 hours: Temp:  [97.5 F (36.4 C)-98.3 F (36.8 C)] 98.2 F (36.8 C) (09/20 0625) Pulse Rate:  [84-99] 88  (09/20 0625) Resp:  [10-24] 16  (09/20 0625) BP: (124-143)/(67-89) 136/84 mmHg (09/20 0625) SpO2:  [93 %-100 %] 97 % (09/20 0625) Weight:  [89.8 kg (197 lb 15.6 oz)] 89.8 kg (197 lb 15.6 oz) (09/19 2201)  Intake/Output from previous day: 09/19 0701 - 09/20 0700 In: 3120 [P.O.:120; I.V.:3000] Out: 2395 [Urine:1600; Drains:545; Blood:250] Intake/Output this shift:    Strength is 5 out of 5 wound is clean and dry  Lab Results: No results found for this basename: WBC:2,HGB:2,HCT:2,PLT:2 in the last 72 hours BMET No results found for this basename: NA:2,K:2,CL:2,CO2:2,GLUCOSE:2,BUN:2,CREATININE:2,CALCIUM:2 in the last 72 hours  Studies/Results: Dg Lumbar Spine 2-3 Views  01/30/2012  *RADIOLOGY REPORT*  Clinical Data: L5-S1 PLIF  LUMBAR SPINE - 2-3 VIEW,DG C-ARM 61-120 MIN  Comparison: MRI of the lumbar spine 09/13/2011.  Findings: Two intraoperative fluoroscopic spot views are submitted. The patient is status post PLIF at L5-S1.  IMPRESSION: Status post L5-S1 PLIF without radiographic evidence for complication.   Original Report Authenticated By: Jamesetta Orleans. MATTERN, M.D.    Dg C-arm 61-120 Min  01/30/2012  *RADIOLOGY REPORT*  Clinical Data: L5-S1 PLIF  LUMBAR SPINE - 2-3 VIEW,DG C-ARM 61-120 MIN  Comparison: MRI of the lumbar spine 09/13/2011.  Findings: Two intraoperative fluoroscopic spot views are submitted. The patient is status post PLIF at L5-S1.  IMPRESSION: Status post L5-S1 PLIF without radiographic evidence for complication.   Original Report Authenticated By: Jamesetta Orleans. MATTERN, M.D.     Assessment/Plan: Progressive mobilization today or possibly discharge  tomorrow or the next day  LOS: 1 day     Rush Salce P 01/31/2012, 7:37 AM

## 2012-01-31 NOTE — Progress Notes (Signed)
Chaplain met with family member in the [facility] upon receiving the request from Spiritual care Diplomatic Services operational officer. Chaplain shared words of comfort and encouragement with family member. Chaplain prayed for patient and family member. Chaplain will continue to provide spiritual care to both patient and family member at a later time. Chaplain will pass this on to on call chaplain.

## 2012-01-31 NOTE — Progress Notes (Signed)
Occupational Therapy Treatment Patient Details Name: Brandon [name] MRN: [id number] DOB: [birth year] Today's Date: 01/31/2012 Time: 5638-7564 OT Time Calculation (min): 23 min  OT Assessment / Plan / Recommendation Comments on Treatment Session Making excellent progress. Will follow acutely. will not need HHOT. will need 3 in 1 for home D/C. Pt's sister is going to provide assistance after D/C.     Follow Up Recommendations  No OT follow up    Barriers to Discharge  None    Equipment Recommendations  3 in 1 bedside comode    Recommendations for Other Services    Frequency Min 2X/week   Plan Discharge plan remains appropriate    Precautions / Restrictions Precautions Precautions: Back Precaution Booklet Issued: Yes (comment) Required Braces or Orthoses: Spinal Brace Spinal Brace: Applied in sitting position Restrictions Weight Bearing Restrictions: No   Pertinent Vitals/Pain 4    ADL  Grooming: Simulated;Supervision/safety Where Assessed - Grooming: Unsupported standing Upper Body Bathing: Simulated;Supervision/safety Where Assessed - Upper Body Bathing: Unsupported sitting Lower Body Bathing: Simulated;Supervision/safety Where Assessed - Lower Body Bathing: Unsupported sit to stand Upper Body Dressing: Performed;Supervision/safety Where Assessed - Upper Body Dressing: Unsupported sitting Lower Body Dressing: Simulated;Supervision/safety Where Assessed - Lower Body Dressing: Unsupported sit to stand Toilet Transfer: Simulated;Supervision/safety Toilet Transfer Method: Sit to stand;Stand pivot Acupuncturist: Other (comment) (bed - chair) Toileting - Clothing Manipulation and Hygiene: Simulated;Supervision/safety Where Assessed - Engineer, mining and Hygiene: Standing Tub/Shower Transfer: Landscape architect Method: Science writer: Other (comment) (3 in 1) Equipment Used: Back brace;Gait  belt;Reacher Transfers/Ambulation Related to ADLs: S. HHA with ambulation ADL Comments: Educated pt on compensatory techniques and AE for ADL and mobility. Pt verbalized and demonstrated indpendence. Able to demonstrate bed mobility following log rolling precautions. Able to verbalize back precautions.    OT Diagnosis: Generalized weakness;Acute pain  OT Problem List: Decreased strength;Decreased range of motion;Decreased activity tolerance;Decreased safety awareness;Decreased knowledge of use of DME or AE;Decreased knowledge of precautions;Pain OT Treatment Interventions: Self-care/ADL training;Energy conservation;DME and/or AE instruction;Therapeutic activities;Patient/family education   OT Goals Acute Rehab OT Goals OT Goal Formulation: With patient Time For Goal Achievement: 02/07/12 Potential to Achieve Goals: Good ADL Goals Pt Will Perform Upper Body Dressing: with modified independence;Other (comment);Unsupported;Sitting, chair ADL Goal: Upper Body Dressing - Progress: Progressing toward goals Pt Will Transfer to Toilet: with modified independence;3-in-1;Maintaining back safety precautions ADL Goal: Toilet Transfer - Progress: Progressing toward goals Pt Will Perform Toileting - Clothing Manipulation: with modified independence ADL Goal: Toileting - Clothing Manipulation - Progress: Progressing toward goals Pt Will Perform Toileting - Hygiene: with modified independence;Sitting on 3-in-1 or toilet ADL Goal: Toileting - Hygiene - Progress: Progressing toward goals Additional ADL Goal #1: Pt will state 3/3 back precautions independently ADL Goal: Additional Goal #1 - Progress: Met Additional ADL Goal #2: Demonstrate bed mobility with mod I in preparation for ADL. ADL Goal: Additional Goal #2 - Progress: Met  Visit Information  Last OT Received On: 01/31/12 Assistance Needed: +1    Subjective Data      Prior Functioning  Home Living Lives With: Alone Available Help at  Discharge: Family;Available 24 hours/day (sister here for a week) Type of Home: Apartment Home Access: Stairs to enter Entrance Stairs-Number of Steps: 32 Entrance Stairs-Rails: Right;Left Home Layout: One level Bathroom Shower/Tub: Tub/shower unit;Walk-in shower Bathroom Toilet: Standard Bathroom Accessibility: Yes How Accessible: Accessible via walker Home Adaptive Equipment: None Prior Function Level of Independence: Independent Able to Take Stairs?: Yes Driving: Yes 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 Communication: No difficulties Dominant Hand: Right    Cognition  Overall Cognitive Status: Appears within functional limits for tasks assessed/performed Arousal/Alertness: Awake/alert Orientation Level: Appears intact for tasks assessed Behavior During Session: Anxious    Mobility  Shoulder Instructions Bed Mobility Bed Mobility: Sit to Supine;Sit to Sidelying Left;Scooting to [facility] Rolling Left: 6: Modified independent (Device/Increase time);With rail Right Sidelying to Sit: HOB flat;With rails Supine to Sit: 5: Supervision Details for Bed Mobility Assistance: vc for log rolling Transfers Transfers: Sit to Stand;Stand to Sit Sit to Stand: 5: Supervision;With upper extremity assist;From chair/3-in-1 Stand to Sit: 6: Modified independent (Device/Increase time);With upper extremity assist;To bed Details for Transfer Assistance: good technique       Exercises      Balance  S   End of Session OT - End of Session Equipment Utilized During Treatment: Gait belt;Back brace Activity Tolerance: Patient tolerated treatment well Patient left: in bed;with call bell/phone within reach;with family/visitor present Nurse Communication: Precautions  GO     Damyiah Moxley,HILLARY 01/31/2012, 4:27 PM [facility], OTR/L  [phone] 01/31/2012

## 2012-01-31 NOTE — Progress Notes (Addendum)
Occupational Therapy Evaluation Patient Details Name: Brandon [name] MRN: [id number] DOB: 08-11-1955 Today's Date: 01/31/2012 Time: 4098-1191 OT Time Calculation (min): 16 min  OT Assessment / Plan / Recommendation Clinical Impression  56 yo s/p lumbar fusion. Pt will benefit from skilled OT srvices to complete education regarding compensatory techniques and AE for ADL following back precautions,  due to below deficits. Pt making excellent progress. If able to complete 32 steps, should be able to D/C home Sunday     OT Assessment  Patient needs continued OT Services    Follow Up Recommendations  No OT follow up    Barriers to Discharge None sister plans to assist for 1 week    Equipment Recommendations  3 in 1 bedside comode    Recommendations for Other Services  none  Frequency  Min 2X/week    Precautions / Restrictions Precautions Precautions: Back Precaution Booklet Issued: Yes (comment) Required Braces or Orthoses: Spinal Brace Spinal Brace: Applied in sitting position Restrictions Weight Bearing Restrictions: No   Pertinent Vitals/Pain 5. Premedicated    ADL  Grooming: Simulated;Supervision/safety Where Assessed - Grooming: Unsupported standing Upper Body Bathing: Simulated;Supervision/safety Where Assessed - Upper Body Bathing: Unsupported sitting Lower Body Bathing: Simulated;Supervision/safety Where Assessed - Lower Body Bathing: Unsupported sit to stand Upper Body Dressing: Performed;Supervision/safety Where Assessed - Upper Body Dressing: Unsupported sitting Lower Body Dressing: Simulated;Supervision/safety Where Assessed - Lower Body Dressing: Unsupported sit to stand Toilet Transfer: Simulated;Supervision/safety Toilet Transfer Method: Sit to stand;Stand pivot Acupuncturist: Other (comment) (bed - chair) Toileting - Clothing Manipulation and Hygiene: Simulated;Supervision/safety Where Assessed - Engineer, mining and Hygiene:  Standing Tub/Shower Transfer: Landscape architect Method: Science writer: Other (comment) (3 in 1) Equipment Used: Back brace;Gait belt;Reacher Transfers/Ambulation Related to ADLs: S. HHA with ambulation ADL Comments: little knowledge of compensatory techniques    OT Diagnosis: Generalized weakness;Acute pain  OT Problem List: Decreased strength;Decreased range of motion;Decreased activity tolerance;Decreased safety awareness;Decreased knowledge of use of DME or AE;Decreased knowledge of precautions;Pain OT Treatment Interventions: Self-care/ADL training;Energy conservation;DME and/or AE instruction;Therapeutic activities;Patient/family education   OT Goals Acute Rehab OT Goals OT Goal Formulation: With patient Time For Goal Achievement: 02/07/12 Potential to Achieve Goals: Good ADL Goals Pt Will Perform Upper Body Dressing: with modified independence;Other (comment);Unsupported;Sitting, chair (donning back brace) ADL Goal: Upper Body Dressing - Progress: Goal set today Pt Will Transfer to Toilet: with modified independence;3-in-1;Maintaining back safety precautions ADL Goal: Toilet Transfer - Progress: Goal set today Pt Will Perform Toileting - Clothing Manipulation: with modified independence ADL Goal: Toileting - Clothing Manipulation - Progress: Goal set today Pt Will Perform Toileting - Hygiene: with modified independence;Sitting on 3-in-1 or toilet ADL Goal: Toileting - Hygiene - Progress: Goal set today Additional ADL Goal #1: Pt will state 3/3 back precautions independently ADL Goal: Additional Goal #1 - Progress: Goal set today Additional ADL Goal #2: Demonstrate bed mobility with mod I in preparation for ADL. ADL Goal: Additional Goal #2 - Progress: Goal set today  Visit Information  Last OT Received On: 01/31/12    Subjective Data      Prior Functioning  Vision/Perception  Home Living Lives With:  Alone Available Help at Discharge: Family;Available 24 hours/day (sister here for a week) Type of Home: Apartment Home Access: Stairs to enter Entrance Stairs-Number of Steps: 32 Entrance Stairs-Rails: Right;Left Home Layout: One level Bathroom Shower/Tub: Tub/shower unit;Walk-in shower Bathroom Toilet: Standard Bathroom Accessibility: Yes How Accessible: Accessible via walker Home Adaptive Equipment: None Prior Function  Level of Independence: Independent Able to Take Stairs?: Yes Driving: Yes 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 Communication: No difficulties Dominant Hand: Right      Cognition  Overall Cognitive Status: Appears within functional limits for tasks assessed/performed Arousal/Alertness: Awake/alert Orientation Level: Appears intact for tasks assessed Behavior During Session: Anxious    Extremity/Trunk Assessment Right Upper Extremity Assessment RUE ROM/Strength/Tone: Within functional levels Left Upper Extremity Assessment LUE ROM/Strength/Tone: Within functional levels Right Lower Extremity Assessment RLE ROM/Strength/Tone: WFL for tasks assessed Left Lower Extremity Assessment LLE ROM/Strength/Tone: WFL for tasks assessed Trunk Assessment Trunk Assessment: Kyphotic   Mobility  Shoulder Instructions  Bed Mobility Bed Mobility: Supine to Sit;Rolling Left;Right Sidelying to Sit Rolling Left: 5: Supervision Right Sidelying to Sit: HOB flat;With rails Supine to Sit: 5: Supervision Details for Bed Mobility Assistance: vc for log rolling Transfers Transfers: Sit to Stand;Stand to Sit Sit to Stand: 5: Supervision Stand to Sit: 5: Supervision       Exercise     Balance  S   End of Session OT - End of Session Equipment Utilized During Treatment: Gait belt;Back brace Activity Tolerance: Patient tolerated treatment well Patient left: in chair;with call bell/phone within reach;with family/visitor present Nurse  Communication: Precautions  GO     Gaberiel Youngblood,HILLARY 01/31/2012, 3:56 PM [facility], OTR/L  [phone] 01/31/2012

## 2012-01-31 NOTE — Evaluation (Signed)
Physical Therapy Evaluation Patient Details Name: Brandon [name] MRN: [id number] DOB: Jul 26, 1955 Today's Date: 01/31/2012 Time: 4098-1191 PT Time Calculation (min): 32 min  PT Assessment / Plan / Recommendation Clinical Impression  56 y.o. male admitted to [facility] for L5/S1 decompression and fusion.      PT Assessment  Patient needs continued PT services    Follow Up Recommendations  No PT follow up    Barriers to Discharge  NA      Equipment Recommendations  None recommended by PT    Recommendations for Other Services   NA  Frequency Min 6X/week    Precautions / Restrictions Precautions Precautions: Back Precaution Booklet Issued: Yes (comment) Required Braces or Orthoses: Spinal Brace Spinal Brace: Applied in sitting position Restrictions Weight Bearing Restrictions: No   Pertinent Vitals/Pain 6/10 surgical low back pain, no reports of radicular symptoms into the legs.      Mobility  Bed Mobility Bed Mobility: Sit to Supine;Sit to Sidelying Left;Scooting to [facility] Rolling Left: 6: Modified independent (Device/Increase time);With rail Right Sidelying to Sit: HOB flat;With rails Left Sidelying to Sit: 6: Modified independent (Device/Increase time);With rails;HOB elevated Supine to Sit: 5: Supervision Sit to Sidelying Left: 6: Modified independent (Device/Increase time);With rail;HOB elevated Scooting to [facility]: 6: Modified independent (Device/Increase time);With rail Details for Bed Mobility Assistance: used rail, no cues needed for log roll Transfers Sit to Stand: 5: Supervision Stand to Sit: 6: Modified independent (Device/Increase time);With upper extremity assist;To bed Details for Transfer Assistance: supervision to stand for safety secondary to OT reported that pt got lightheaded before.   Ambulation/Gait Ambulation/Gait Assistance: 5: Supervision Ambulation Distance (Feet): 150 Feet Assistive device: Rolling walker Ambulation/Gait Assistance Details: supervision for  safety secondary to reported lightheadedness earlier today and some still lingering.  Chair to follow, but not needed.   Gait Pattern: Step-through pattern Stairs: Yes Stairs Assistance: 5: Supervision Stairs Assistance Details (indicate cue type and reason): supervision for safety, verbal cues for technique and safety Stair Management Technique: One rail Right;Step to pattern;Alternating pattern Number of Stairs: 8         PT Diagnosis: Difficulty walking;Abnormality of gait;Acute pain  PT Problem List: Decreased activity tolerance;Decreased mobility;Decreased knowledge of use of DME;Decreased knowledge of precautions;Pain PT Treatment Interventions: DME instruction;Gait training;Stair training;Functional mobility training;Therapeutic activities;Therapeutic exercise;Balance training;Neuromuscular re-education;Patient/family education   PT Goals Acute Rehab PT Goals PT Goal Formulation: With patient/family Time For Goal Achievement: 02/07/12 Potential to Achieve Goals: Good Pt will Roll Supine to Right Side: Independently PT Goal: Rolling Supine to Right Side - Progress: Goal set today Pt will Roll Supine to Left Side: Independently PT Goal: Rolling Supine to Left Side - Progress: Goal set today Pt will go Supine/Side to Sit: Independently;with HOB 0 degrees PT Goal: Supine/Side to Sit - Progress: Goal set today Pt will go Sit to Supine/Side: Independently;with HOB 0 degrees PT Goal: Sit to Supine/Side - Progress: Goal set today Pt will go Sit to Stand: with modified independence PT Goal: Sit to Stand - Progress: Goal set today Pt will go Stand to Sit: Independently PT Goal: Stand to Sit - Progress: Goal set today Pt will Ambulate: >150 feet;Independently PT Goal: Ambulate - Progress: Goal set today Pt will Go Up / Down Stairs: Flight;with modified independence;with rail(s) PT Goal: Up/Down Stairs - Progress: Goal set today Additional Goals Additional Goal #1: Pt will report 3/3  back precautions and demonstrate compliance with them 100% of his treatment.   PT Goal: Additional Goal #1 - Progress: Goal  set today  Visit Information  Last PT Received On: 01/31/12 Assistance Needed: +1    Subjective Data   Pt reports he feels better today with RW, but thinks he won't need it at home and I agree.     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 Dominant Hand: Right    Cognition  Overall Cognitive Status: Appears within functional limits for tasks assessed/performed Arousal/Alertness: Awake/alert Orientation Level: Appears intact for tasks assessed Behavior During Session: Anxious    Extremity/Trunk Assessment Right Upper Extremity Assessment RUE ROM/Strength/Tone: Within functional levels Left Upper Extremity Assessment LUE ROM/Strength/Tone: Within functional levels Right Lower Extremity Assessment RLE ROM/Strength/Tone: WFL for tasks assessed Left Lower Extremity Assessment LLE ROM/Strength/Tone: WFL for tasks assessed Trunk Assessment Trunk Assessment: Kyphotic   Balance    End of Session PT - End of Session Equipment Utilized During Treatment: Back brace Activity Tolerance: Patient limited by fatigue;Patient limited by pain Patient left: in bed;with call bell/phone within reach;with family/visitor present (sister in room)    Lurena Joiner B. Khallid Pasillas, PT,  DPT [phone]   01/31/2012, 4:57 PM

## 2012-02-01 MED ORDER — HYDROMORPHONE HCL 2 MG PO TABS
2.0000 mg | ORAL_TABLET | ORAL | Status: DC | PRN
  Administered 2012-02-01 – 2012-02-02 (×4): 4 mg via ORAL
  Administered 2012-02-02: 2 mg via ORAL
  Administered 2012-02-02: 4 mg via ORAL
  Filled 2012-02-01 (×6): qty 2

## 2012-02-01 NOTE — Progress Notes (Signed)
Overall doing well. Still requiring IV Dilaudid for pain control however. Does not feel ready for discharge.  He is afebrile. Vitals are stable. His wound is clean and dry. Overall he looks good. Motor and sensory examination intact. Dressing dry.  We'll start oral Dilaudid for pain relief. If he tolerates this well and then probable discharge tomorrow.

## 2012-02-01 NOTE — Progress Notes (Signed)
Physical Therapy Treatment Patient Details Name: Brandon [name] MRN: [id number] DOB: [birth year] Today's Date: 02/01/2012 Time: 7829-5621 PT Time Calculation (min): 13 min  PT Assessment / Plan / Recommendation Comments on Treatment Session  Pt. is pleased with the results he has gotten from the new pain med, and is hopeful that switching to it's P.O. version will work as well. He is mobilizing with less pain and should be ready for Dc home when medically ready.    Follow Up Recommendations  No PT follow up    Barriers to Discharge        Equipment Recommendations  None recommended by PT    Recommendations for Other Services    Frequency Min 6X/week   Plan Discharge plan remains appropriate    Precautions / Restrictions Precautions Precautions: Back Precaution Booklet Issued: Yes (comment) Required Braces or Orthoses: Spinal Brace Spinal Brace: Applied in sitting position Restrictions Weight Bearing Restrictions: No   Pertinent Vitals/Pain Pain not rated by pt. But he says it is much better with the new pain med.  No distress.    Mobility  Bed Mobility Bed Mobility: Rolling Left;Left Sidelying to Sit;Sit to Sidelying Left Rolling Left: 6: Modified independent (Device/Increase time);With rail Left Sidelying to Sit: 6: Modified independent (Device/Increase time);With rails;HOB elevated Sit to Sidelying Left: 6: Modified independent (Device/Increase time);With rail;HOB elevated Scooting to [facility]: 6: Modified independent (Device/Increase time);With rail Details for Bed Mobility Assistance: reminder needed to fully log roll; pt. only partially completed log roll before attempt to sit up Transfers Transfers: Sit to Stand;Stand to Sit Sit to Stand: 5: Supervision Stand to Sit: 6: Modified independent (Device/Increase time);With upper extremity assist;To bed Details for Transfer Assistance: supervision for safety due to slight dizziness when up Ambulation/Gait Ambulation/Gait  Assistance: 5: Supervision Ambulation Distance (Feet): 200 Feet Assistive device: Rolling walker Ambulation/Gait Assistance Details: Cues for erect posture, otherwise managing RW well Gait Pattern: Step-through pattern Stairs: No    Exercises     PT Diagnosis:    PT Problem List:   PT Treatment Interventions:     PT Goals Acute Rehab PT Goals PT Goal: Rolling Supine to Left Side - Progress: Progressing toward goal PT Goal: Supine/Side to Sit - Progress: Progressing toward goal PT Goal: Sit to Supine/Side - Progress: Progressing toward goal PT Goal: Sit to Stand - Progress: Progressing toward goal PT Goal: Stand to Sit - Progress: Progressing toward goal PT Goal: Ambulate - Progress: Progressing toward goal Additional Goals PT Goal: Additional Goal #1 - Progress: Progressing toward goal  Visit Information  Last PT Received On: 02/01/12 Assistance Needed: +1    Subjective Data  Subjective: "I'd like some coffee.Marland KitchenMarland KitchenI feel pretty good"  Pt. says new pain med working better for him.   Cognition  Overall Cognitive Status: Appears within functional limits for tasks assessed/performed Arousal/Alertness: Awake/alert Orientation Level: Appears intact for tasks assessed Behavior During Session: [facility] for tasks performed    Balance     End of Session PT - End of Session Equipment Utilized During Treatment: Back brace Activity Tolerance: Patient tolerated treatment well Patient left: in bed;with call bell/phone within reach Nurse Communication: Mobility status   GP     Ferman Hamming 02/01/2012, 3:28 PM Weldon Picking PT Acute Rehab Services [phone] Beeper [phone]

## 2012-02-02 MED ORDER — MAGNESIUM HYDROXIDE 400 MG/5ML PO SUSP: 30.0000 mL | Freq: Every day | ORAL | Status: DC | PRN

## 2012-02-02 MED ORDER — HYDROMORPHONE HCL 2 MG PO TABS: 2.0000 mg | ORAL_TABLET | ORAL | Status: DC | PRN

## 2012-02-02 MED ORDER — MAGNESIUM HYDROXIDE 400 MG/5ML PO SUSP
30.0000 mL | Freq: Every day | ORAL | Status: DC | PRN
  Administered 2012-02-02: 30 mL via ORAL
  Filled 2012-02-02: qty 30

## 2012-02-02 NOTE — Progress Notes (Signed)
Hemovac removed per Dr Barnett Abu order. Pt tolerated.

## 2012-02-02 NOTE — Progress Notes (Signed)
Physical Therapy Treatment Patient Details Name: Brandon [name] MRN: [id number] DOB: Feb 19, 1956 Today's Date: 02/02/2012 Time: 4098-1191 PT Time Calculation (min): 25 min  PT Assessment / Plan / Recommendation Comments on Treatment Session  Pt ready for d/c planned today. Agrees to discuss OPPT with surgeon at follow up appt after d.c DC from PT services due to goals met.    Follow Up Recommendations  No PT follow up    Barriers to Discharge        Equipment Recommendations  None recommended by PT    Recommendations for Other Services    Frequency     Plan Discharge plan remains appropriate;All goals met and education completed, patient dischaged from PT services    Precautions / Restrictions Precautions Precautions: Back Precaution Booklet Issued: No Required Braces or Orthoses: Spinal Brace Spinal Brace: Lumbar corset;Applied in sitting position Restrictions Weight Bearing Restrictions: No   Pertinent Vitals/Pain Minimal pain reported    Mobility  Bed Mobility Bed Mobility: Rolling Right;Right Sidelying to Sit;Sit to Sidelying Right;Rolling Left Rolling Right: 7: Independent Rolling Left: 7: Independent Right Sidelying to Sit: 6: Modified independent (Device/Increase time);HOB flat;With rails Sit to Sidelying Right: 6: Modified independent (Device/Increase time);With rail;HOB flat Transfers Transfers: Sit to Stand;Stand to Sit Sit to Stand: 6: Modified independent (Device/Increase time);With upper extremity assist Stand to Sit: 6: Modified independent (Device/Increase time);With upper extremity assist Ambulation/Gait Ambulation/Gait Assistance: 6: Modified independent (Device/Increase time) Ambulation Distance (Feet): 175 Feet Assistive device: Rolling walker Ambulation/Gait Assistance Details: observed with device, with cues x1 for posture and decrease WB UE to faciliate LE load bearing and improved surgical outcomes.  Observed amb without walker noting decreased hip  extension through stride phase and decrease pelvic rotation bilaterally due to stiffness and holding in lower back.  Discussed goal or normalized gait pattern and potential benefit of OP therapy after surgical site healed enough (pt to discuss with surgeon). Gait Pattern: Within Functional Limits;Step-through pattern General Gait Details: see above Stairs: Yes Stairs Assistance: 5: Supervision Stairs Assistance Details (indicate cue type and reason): verbal cues for sequencing, problem solving in simulated home environement. pt has 4 flights of 8 steps each to reach apartment.  Discussed goal to transition to reciprocal pattern on ascend/decend once pt feels bilateral equal strength in legs and feels safe to attempt.  Pt prefers step-to pattern currently.  Denies concern in ability to ascend steps to apartment upon d/c today. Stair Management Technique: One rail Right;Step to pattern;Forwards Number of Stairs: 10     Exercises     PT Diagnosis:    PT Problem List:   PT Treatment Interventions:     PT Goals Acute Rehab PT Goals PT Goal: Rolling Supine to Right Side - Progress: Met PT Goal: Rolling Supine to Left Side - Progress: Met PT Goal: Supine/Side to Sit - Progress: Met PT Goal: Sit to Supine/Side - Progress: Met PT Goal: Sit to Stand - Progress: Met PT Goal: Stand to Sit - Progress: Met PT Goal: Ambulate - Progress: Progressing toward goal PT Goal: Up/Down Stairs - Progress: Progressing toward goal Additional Goals PT Goal: Additional Goal #1 - Progress: Met  Visit Information  Last PT Received On: 02/02/12 Assistance Needed: +1    Subjective Data  Subjective: Going home today   Cognition  Overall Cognitive Status: Appears within functional limits for tasks assessed/performed Arousal/Alertness: Awake/alert Orientation Level: Oriented X4 / Intact Behavior During Session: [facility] for tasks performed    Balance  Balance Balance Assessed: No  End of Session PT - End of  Session Equipment Utilized During Treatment: Back brace Activity Tolerance: Patient tolerated treatment well Patient left: in bed;with call bell/phone within reach Nurse Communication: Mobility status   GP     Dennis Bast 02/02/2012, 11:34 AM

## 2012-02-02 NOTE — Progress Notes (Signed)
Patient ID: Brandon [name], male   DOB: 06/16/1955, 56 y.o.   MRN: [id number] Lobe lysing well incision is clean and dry drain is removed today and patient is being discharged home

## 2012-02-03 NOTE — Care Management Note (Signed)
    Page 1 of 1   02/03/2012     8:37:24 AM   CARE MANAGEMENT NOTE 02/03/2012  Patient:  Brandon [name], Brandon [name]   Account Number:  [id number]  Date Initiated:  01/31/2012  Documentation initiated by:  [facility]  Subjective/Objective Assessment:   Admitted postop redo decompression lumbar laminectomy fusion L5-S1.     Action/Plan:   PT eval- no follow needed   Anticipated DC Date:  02/02/2012   Anticipated DC Plan:  HOME/SELF CARE      DC Planning Services  CM consult      Choice offered to / List presented to:             Status of service:  Completed, signed off Medicare Important Message given?   (If response is "NO", the following Medicare IM given date fields will be blank) Date Medicare IM given:   Date Additional Medicare IM given:    Discharge Disposition:  HOME/SELF CARE  Per UR Regulation:  Reviewed for med. necessity/level of care/duration of stay  If discussed at Long Length of Stay Meetings, dates discussed:    Comments:

## 2012-02-04 MED FILL — Sodium Chloride IV Soln 0.9%: INTRAVENOUS | Qty: 1000 | Status: AC

## 2012-02-04 MED FILL — Heparin Sodium (Porcine) Inj 1000 Unit/ML: INTRAMUSCULAR | Qty: 30 | Status: AC

## 2012-05-13 HISTORY — PX: OTHER SURGICAL HISTORY: SHX169

## 2012-09-29 ENCOUNTER — Other Ambulatory Visit: Payer: Self-pay | Admitting: Internal Medicine

## 2012-10-31 ENCOUNTER — Other Ambulatory Visit: Payer: Self-pay | Admitting: Internal Medicine

## 2012-11-15 ENCOUNTER — Other Ambulatory Visit: Payer: Self-pay | Admitting: Internal Medicine

## 2012-11-25 ENCOUNTER — Other Ambulatory Visit (INDEPENDENT_AMBULATORY_CARE_PROVIDER_SITE_OTHER): Payer: BC Managed Care – PPO

## 2012-11-25 DIAGNOSIS — Z0000 Encounter for general adult medical examination without abnormal findings: Secondary | ICD-10-CM

## 2012-11-25 LAB — LIPID PANEL
Cholesterol: 137 mg/dL (ref 0–200)
HDL: 59.1 mg/dL (ref >39.00)
LDL Cholesterol: 70 mg/dL (ref 0–99)
Total CHOL/HDL Ratio: 2
Triglycerides: 38 mg/dL (ref 0.0–149.0)
VLDL: 7.6 mg/dL (ref 0.0–40.0)

## 2012-11-25 LAB — BASIC METABOLIC PANEL
BUN: 12 mg/dL (ref 6–23)
CO2: 30 mEq/L (ref 19–32)
Calcium: 9.8 mg/dL (ref 8.4–10.5)
Chloride: 99 mEq/L (ref 96–112)
Creatinine, Ser: 0.9 mg/dL (ref 0.4–1.5)
GFR: 94.15 mL/min (ref >60.00)
Glucose, Bld: 102 mg/dL — ABNORMAL HIGH (ref 70–99)
Potassium: 4.6 mEq/L (ref 3.5–5.1)
Sodium: 136 mEq/L (ref 135–145)

## 2012-11-25 LAB — CBC WITH DIFFERENTIAL/PLATELET
Basophils Absolute: 0 10*3/uL (ref 0.0–0.1)
Basophils Relative: 0.4 % (ref 0.0–3.0)
Eosinophils Absolute: 0.2 10*3/uL (ref 0.0–0.7)
Eosinophils Relative: 2.1 % (ref 0.0–5.0)
HCT: 50.2 % (ref 39.0–52.0)
Hemoglobin: 16.8 g/dL (ref 13.0–17.0)
Lymphocytes Relative: 15 % (ref 12.0–46.0)
Lymphs Abs: 1.4 10*3/uL (ref 0.7–4.0)
MCHC: 33.5 g/dL (ref 30.0–36.0)
MCV: 93.3 fl (ref 78.0–100.0)
Monocytes Absolute: 0.7 10*3/uL (ref 0.1–1.0)
Monocytes Relative: 7.8 % (ref 3.0–12.0)
Neutro Abs: 6.8 10*3/uL (ref 1.4–7.7)
Neutrophils Relative %: 74.7 % (ref 43.0–77.0)
Platelets: 217 10*3/uL (ref 150.0–400.0)
RBC: 5.38 Mil/uL (ref 4.22–5.81)
RDW: 15.6 % — ABNORMAL HIGH (ref 11.5–14.6)
WBC: 9.1 10*3/uL (ref 4.5–10.5)

## 2012-11-25 LAB — HEPATIC FUNCTION PANEL
ALT: 24 U/L (ref 0–53)
AST: 22 U/L (ref 0–37)
Albumin: 4.1 g/dL (ref 3.5–5.2)
Alkaline Phosphatase: 100 U/L (ref 39–117)
Bilirubin, Direct: 0.2 mg/dL (ref 0.0–0.3)
Total Bilirubin: 0.7 mg/dL (ref 0.3–1.2)
Total Protein: 7.2 g/dL (ref 6.0–8.3)

## 2012-11-25 LAB — POCT URINALYSIS DIPSTICK
Bilirubin, UA: NEGATIVE
Blood, UA: NEGATIVE
Glucose, UA: NEGATIVE
Ketones, UA: NEGATIVE
Leukocytes, UA: NEGATIVE
Nitrite, UA: NEGATIVE
Protein, UA: NEGATIVE
Spec Grav, UA: <=1.005
Urobilinogen, UA: 0.2
pH, UA: 6.5

## 2012-11-25 LAB — TSH: TSH: 0.69 u[IU]/mL (ref 0.35–5.50)

## 2012-11-25 LAB — PSA: PSA: 1.33 ng/mL (ref 0.10–4.00)

## 2012-12-02 ENCOUNTER — Encounter: Payer: Self-pay | Admitting: Internal Medicine

## 2012-12-02 ENCOUNTER — Ambulatory Visit (INDEPENDENT_AMBULATORY_CARE_PROVIDER_SITE_OTHER): Payer: BC Managed Care – PPO | Admitting: Internal Medicine

## 2012-12-02 VITALS — BP 140/86 | HR 84 | Temp 98.8°F | Resp 20 | Ht 73.0 in | Wt 192.0 lb

## 2012-12-02 DIAGNOSIS — Z0000 Encounter for general adult medical examination without abnormal findings: Secondary | ICD-10-CM

## 2012-12-02 DIAGNOSIS — F17200 Nicotine dependence, unspecified, uncomplicated: Secondary | ICD-10-CM

## 2012-12-02 DIAGNOSIS — I350 Nonrheumatic aortic (valve) stenosis: Secondary | ICD-10-CM

## 2012-12-02 DIAGNOSIS — I359 Nonrheumatic aortic valve disorder, unspecified: Secondary | ICD-10-CM

## 2012-12-02 DIAGNOSIS — I10 Essential (primary) hypertension: Secondary | ICD-10-CM

## 2012-12-02 DIAGNOSIS — E785 Hyperlipidemia, unspecified: Secondary | ICD-10-CM

## 2012-12-02 MED ORDER — HYDROCODONE-ACETAMINOPHEN 5-325 MG PO TABS: 2.0000 | ORAL_TABLET | ORAL | Status: DC | PRN

## 2012-12-02 MED ORDER — SILDENAFIL CITRATE 50 MG PO TABS: 50.0000 mg | ORAL_TABLET | Freq: Every day | ORAL | Status: DC | PRN

## 2012-12-02 NOTE — Patient Instructions (Signed)
Smoking tobacco is very bad for your health. You should stop smoking immediately.    It is important that you exercise regularly, at least 20 minutes 3 to 4 times per week.  If you develop chest pain or shortness of breath seek  medical attention.  Return in one year for follow-up   

## 2012-12-02 NOTE — Progress Notes (Signed)
Patient ID: Brandon [name], male   DOB: 06/10/1955, 57 y.o.   MRN: [id number]  Subjective:    Patient ID: Brandon [name], male    DOB: 22-Sep-1955, 57 y.o.   MRN: [id number]  HPI 12 ear-old patient who is in today for an annual exam. Medical problems include coronary artery disease which has been stable. Since his last preventive health examination he has had a lumbar fusion. This has resulted in a very nice clinical response with resolution of his low back pain. He is followed by rheumatology for ankylosing spondylitis. History of hypertension and dyslipidemia. Unfortunately he continues to smoke one half pack of cigarettes daily.  He has history of colonic polyps and is status post followup colonoscopy 2012.  And 2013 he had a 2-D echocardiogram as well as a nuclear stress test  Past Medical History  Diagnosis Date  . BENIGN PROSTATIC HYPERTROPHY 06/07/2008  . COLONIC POLYPS, HX OF 06/07/2008  . CORONARY ARTERY DISEASE 06/07/2008  . HYPERLIPIDEMIA 06/07/2008  . HYPERTENSION 06/07/2008  . NEPHROLITHIASIS, HX OF 06/07/2008  . Ankylosing spondylitis   . Myocardial infarction   . H/O hiatal hernia   . Heart murmur   . Arthritis     History   Social History  . Marital Status: Single    Spouse Name: N/A    Number of Children: N/A  . Years of Education: N/A   Occupational History  . Manager    Social History Main Topics  . Smoking status: Current Every Day Smoker -- 0.10 packs/day for 40 years    Types: Cigarettes  . Smokeless tobacco: Never Used  . Alcohol Use: 9.0 oz/week    15 Cans of beer per week  . Drug Use: No  . Sexually Active: Not on file   Other Topics Concern  . Not on file   Social History Narrative  . No narrative on file    Past Surgical History  Procedure Laterality Date  . Laminectomy      lumbar  . Coronary angioplasty with stent placement      Stent- approx 2008  . Tonsillectomy      Family History  Problem Relation Age of Onset  . Depression Father    . Arthritis Sister     RA  . Heart failure Mother   . Other Neg Hx     No Known Allergies  Current Outpatient Prescriptions on File Prior to Visit  Medication Sig Dispense Refill  . aspirin 325 MG EC tablet Take 325 mg by mouth daily.      [name] atenolol (TENORMIN) 50 MG tablet take 1 tablet by mouth once daily  90 tablet  0  . atorvastatin (LIPITOR) 80 MG tablet take 1 tablet by mouth once daily  90 tablet  0  . cyclobenzaprine (FLEXERIL) 10 MG tablet Take 1 tablet (10 mg total) by mouth 3 (three) times daily as needed for muscle spasms.  60 tablet  1  . etanercept (ENBREL) 50 MG/ML injection Inject 50 mg into the skin once a week. Sunday       . MICARDIS HCT 80-12.5 MG per tablet take 1 tablet by mouth once daily  90 tablet  0  . Multiple Vitamin (MULTIVITAMIN WITH MINERALS) TABS Take 1 tablet by mouth daily.      . tamsulosin (FLOMAX) 0.4 MG CAPS take 1 capsule by mouth once daily  90 capsule  0   No current facility-administered medications on file prior to visit.  BP 140/86  Pulse 84  Temp(Src) 98.8 F (37.1 C) (Oral)  Resp 20  Ht 6\' 1"  (1.854 m)  Wt 192 lb (87.091 kg)  BMI 25.34 kg/m2  SpO2 98%      Review of Systems  Constitutional: Negative for fever, chills, activity change, appetite change and fatigue.  HENT: Negative for hearing loss, ear pain, congestion, rhinorrhea, sneezing, mouth sores, trouble swallowing, neck pain, neck stiffness, dental problem, voice change, sinus pressure and tinnitus.   Eyes: Negative for photophobia, pain, redness and visual disturbance.  Respiratory: Negative for apnea, cough, choking, chest tightness, shortness of breath and wheezing.   Cardiovascular: Negative for chest pain, palpitations and leg swelling.  Gastrointestinal: Negative for nausea, vomiting, abdominal pain, diarrhea, constipation, blood in stool, abdominal distention, anal bleeding and rectal pain.  Genitourinary: Negative for dysuria, urgency, frequency, hematuria,  flank pain, decreased urine volume, discharge, penile swelling, scrotal swelling, difficulty urinating, genital sores and testicular pain.  Musculoskeletal: Positive for back pain. Negative for myalgias, joint swelling, arthralgias and gait problem.  Skin: Negative for color change, rash and wound.  Neurological: Negative for dizziness, tremors, seizures, syncope, facial asymmetry, speech difficulty, weakness, light-headedness, numbness and headaches.  Hematological: Negative for adenopathy. Does not bruise/bleed easily.  Psychiatric/Behavioral: Negative for suicidal ideas, hallucinations, behavioral problems, confusion, sleep disturbance, self-injury, dysphoric mood, decreased concentration and agitation. The patient is not nervous/anxious.        Objective:   Physical Exam  Constitutional: He appears well-developed and well-nourished.  HENT:  Head: Normocephalic and atraumatic.  Right Ear: External ear normal.  Left Ear: External ear normal.  Nose: Nose normal.  Mouth/Throat: Oropharynx is clear and moist.  Eyes: Conjunctivae and EOM are normal. Pupils are equal, round, and reactive to light. No scleral icterus.  Neck: Neck supple. No JVD present. No thyromegaly present.  Moderate decreased range of motion of cervical and lumbar spine  Cardiovascular: Normal rate, regular rhythm and intact distal pulses.  Exam reveals no gallop and no friction rub.   Murmur heard. Grade 2/6 systolic murmur loudest at the base  Pulmonary/Chest: Effort normal and breath sounds normal. He exhibits no tenderness.  Abdominal: Soft. Bowel sounds are normal. He exhibits no distension and no mass. There is no tenderness.  Mild ventral hernia  Genitourinary: Prostate normal and penis normal. Guaiac negative stool.  Musculoskeletal: Normal range of motion. He exhibits no edema and no tenderness.  Lymphadenopathy:    He has no cervical adenopathy.  Neurological: He is alert. He has normal reflexes. No cranial  nerve deficit. Coordination normal.  Skin: Skin is warm and dry. No rash noted.  Psychiatric: He has a normal mood and affect. His behavior is normal.          Assessment & Plan:   Preventive health examination Coronary artery disease stable Ongoing tobacco use Hypertension controlled Dyslipidemia controlled Mild aortic stenosis  Follow rheumatology and orthopedics Recheck here one year Total cessation of smoking encouraged All medications refilled

## 2012-12-02 NOTE — Progress Notes (Signed)
  Subjective:    Patient ID: Brandon [name], male    DOB: 1956-02-09, 57 y.o.   MRN: [id number]  HPI Wt Readings from Last 3 Encounters:  12/02/12 192 lb (87.091 kg)  01/30/12 197 lb 15.6 oz (89.8 kg)  01/30/12 197 lb 15.6 oz (89.8 kg)     Review of Systems     Objective:   Physical Exam        Assessment & Plan:

## 2013-01-03 ENCOUNTER — Other Ambulatory Visit: Payer: Self-pay | Admitting: Internal Medicine

## 2013-01-31 ENCOUNTER — Other Ambulatory Visit: Payer: Self-pay | Admitting: Internal Medicine

## 2013-02-15 ENCOUNTER — Other Ambulatory Visit: Payer: Self-pay | Admitting: Internal Medicine

## 2013-03-26 ENCOUNTER — Encounter: Payer: Self-pay | Admitting: *Deleted

## 2013-03-29 ENCOUNTER — Ambulatory Visit (INDEPENDENT_AMBULATORY_CARE_PROVIDER_SITE_OTHER): Payer: BC Managed Care – PPO | Admitting: Internal Medicine

## 2013-03-29 ENCOUNTER — Encounter: Payer: Self-pay | Admitting: Internal Medicine

## 2013-03-29 VITALS — BP 120/80 | HR 92 | Temp 98.1°F | Resp 20 | Wt 190.0 lb

## 2013-03-29 DIAGNOSIS — R221 Localized swelling, mass and lump, neck: Secondary | ICD-10-CM

## 2013-03-29 DIAGNOSIS — F17200 Nicotine dependence, unspecified, uncomplicated: Secondary | ICD-10-CM

## 2013-03-29 DIAGNOSIS — R220 Localized swelling, mass and lump, head: Secondary | ICD-10-CM

## 2013-03-29 DIAGNOSIS — Z23 Encounter for immunization: Secondary | ICD-10-CM

## 2013-03-29 DIAGNOSIS — I10 Essential (primary) hypertension: Secondary | ICD-10-CM

## 2013-03-29 DIAGNOSIS — E785 Hyperlipidemia, unspecified: Secondary | ICD-10-CM

## 2013-03-29 MED ORDER — HYDROCODONE-ACETAMINOPHEN 5-325 MG PO TABS: 2.0000 | ORAL_TABLET | ORAL | Status: DC | PRN

## 2013-03-29 NOTE — Patient Instructions (Signed)
Limit your sodium (Salt) intake    It is important that you exercise regularly, at least 20 minutes 3 to 4 times per week.  If you develop chest pain or shortness of breath seek  medical attention.  ENT evaluation as discussed  Smoking tobacco is very bad for your health. You should stop smoking immediately.

## 2013-03-29 NOTE — Progress Notes (Signed)
Pre-visit discussion using our clinic review tool. No additional management support is needed unless otherwise documented below in the visit note.  

## 2013-03-29 NOTE — Progress Notes (Signed)
Subjective:    Patient ID: Brandon [name], male    DOB: Mar 11, 1956, 57 y.o.   MRN: [id number]  HPI  57 year old patient who has a history of hypertension dyslipidemia and ongoing tobacco use.  He has had a small nodule in the left infra-auricular area by his estimate for 2 years. He feels this has become larger He has a history of ankylosing spondylitis and takes of hydrocodone when necessary. No cardiopulmonary complaints  Past Medical History  Diagnosis Date  . BENIGN PROSTATIC HYPERTROPHY 06/07/2008  . COLONIC POLYPS, HX OF 06/07/2008  . CORONARY ARTERY DISEASE 06/07/2008  . HYPERLIPIDEMIA 06/07/2008  . HYPERTENSION 06/07/2008  . NEPHROLITHIASIS, HX OF 06/07/2008  . Ankylosing spondylitis   . Myocardial infarction   . H/O hiatal hernia   . Heart murmur   . Arthritis     History   Social History  . Marital Status: Single    Spouse Name: N/A    Number of Children: N/A  . Years of Education: N/A   Occupational History  . Manager    Social History Main Topics  . Smoking status: Current Every Day Smoker -- 0.10 packs/day for 40 years    Types: Cigarettes  . Smokeless tobacco: Never Used  . Alcohol Use: 9.0 oz/week    15 Cans of beer per week  . Drug Use: No  . Sexual Activity: Not on file   Other Topics Concern  . Not on file   Social History Narrative  . No narrative on file    Past Surgical History  Procedure Laterality Date  . Laminectomy      lumbar  . Coronary angioplasty with stent placement      Stent- approx 2008  . Tonsillectomy      Family History  Problem Relation Age of Onset  . Depression Father   . Arthritis Sister     RA  . Heart failure Mother   . Other Neg Hx     No Known Allergies  Current Outpatient Prescriptions on File Prior to Visit  Medication Sig Dispense Refill  . aspirin 325 MG EC tablet Take 325 mg by mouth daily.      [name] atenolol (TENORMIN) 50 MG tablet take 1 tablet by mouth once daily  90 tablet  0  . atorvastatin (LIPITOR)  80 MG tablet take 1 tablet by mouth once daily  90 tablet  1  . etanercept (ENBREL) 50 MG/ML injection Inject 50 mg into the skin once a week. Sunday       . HYDROcodone-acetaminophen (NORCO/VICODIN) 5-325 MG per tablet Take 2 tablets by mouth every 4 (four) hours as needed.  90 tablet  1  . Multiple Vitamin (MULTIVITAMIN WITH MINERALS) TABS Take 1 tablet by mouth daily.      . sildenafil (VIAGRA) 50 MG tablet Take 1 tablet (50 mg total) by mouth daily as needed.  10 tablet  6  . tamsulosin (FLOMAX) 0.4 MG CAPS capsule take 1 capsule by mouth once daily  90 capsule  1  . telmisartan-hydrochlorothiazide (MICARDIS HCT) 80-12.5 MG per tablet take 1 tablet by mouth once daily  90 tablet  0   No current facility-administered medications on file prior to visit.    BP 120/80  Pulse 92  Temp(Src) 98.1 F (36.7 C) (Oral)  Resp 20  Wt 190 lb (86.183 kg)  SpO2 98%       Review of Systems  Constitutional: Negative for fever, chills, appetite change and fatigue.  HENT:  Negative for congestion, dental problem, ear pain, hearing loss, sore throat, tinnitus, trouble swallowing and voice change.   Eyes: Negative for pain, discharge and visual disturbance.  Respiratory: Negative for cough, chest tightness, wheezing and stridor.   Cardiovascular: Negative for chest pain, palpitations and leg swelling.  Gastrointestinal: Negative for nausea, vomiting, abdominal pain, diarrhea, constipation, blood in stool and abdominal distention.  Genitourinary: Negative for urgency, hematuria, flank pain, discharge, difficulty urinating and genital sores.  Musculoskeletal: Positive for back pain. Negative for arthralgias, gait problem, joint swelling, myalgias and neck stiffness.  Skin: Negative for rash.  Neurological: Negative for dizziness, syncope, speech difficulty, weakness, numbness and headaches.  Hematological: Negative for adenopathy. Does not bruise/bleed easily.  Psychiatric/Behavioral: Negative for  behavioral problems and dysphoric mood. The patient is not nervous/anxious.        Objective:   Physical Exam  Constitutional: He is oriented to person, place, and time. He appears well-developed.  HENT:  Head: Normocephalic.  Right Ear: External ear normal.  Left Ear: External ear normal.  Mouth/Throat: Oropharynx is clear and moist.  Eyes: Conjunctivae and EOM are normal.  Neck:  Margin decreased range of motion  Cardiovascular: Normal rate.   Murmur heard. Grade 3/6 systolic murmur  Pulmonary/Chest: Breath sounds normal.  Abdominal: Bowel sounds are normal.  Musculoskeletal: Normal range of motion. He exhibits no edema and no tenderness.  Neurological: He is alert and oriented to person, place, and time.  Skin:  1.5 cm smooth nodule in the left high submandibular area just inferior to the left ear  Psychiatric: He has a normal mood and affect. His behavior is normal.          Assessment & Plan:   Hypertension well controlled Dyslipidemia CAD Ongoing tobacco use Nodule left neck area. Appears to be a benign sebaceous cyst. Will refer to ENT for their evaluation and recommendations

## 2013-04-04 ENCOUNTER — Other Ambulatory Visit: Payer: Self-pay | Admitting: Internal Medicine

## 2013-04-06 ENCOUNTER — Other Ambulatory Visit (HOSPITAL_COMMUNITY)
Admission: RE | Admit: 2013-04-06 | Discharge: 2013-04-06 | Disposition: A | Discharge status: Home / Self Care | Payer: BC Managed Care – PPO | Source: Ambulatory Visit | Attending: Otolaryngology | Admitting: Otolaryngology

## 2013-04-06 ENCOUNTER — Other Ambulatory Visit: Payer: Self-pay | Admitting: Otolaryngology

## 2013-04-06 DIAGNOSIS — R220 Localized swelling, mass and lump, head: Secondary | ICD-10-CM | POA: Insufficient documentation

## 2013-04-06 DIAGNOSIS — R221 Localized swelling, mass and lump, neck: Secondary | ICD-10-CM | POA: Insufficient documentation

## 2013-04-20 ENCOUNTER — Other Ambulatory Visit: Payer: Self-pay | Admitting: Otolaryngology

## 2013-08-01 ENCOUNTER — Other Ambulatory Visit: Payer: Self-pay | Admitting: Internal Medicine

## 2013-08-10 ENCOUNTER — Other Ambulatory Visit: Payer: Self-pay | Admitting: Internal Medicine

## 2013-09-03 ENCOUNTER — Encounter: Payer: Self-pay | Admitting: Gastroenterology

## 2013-09-27 ENCOUNTER — Other Ambulatory Visit: Payer: Self-pay | Admitting: Internal Medicine

## 2013-10-21 ENCOUNTER — Encounter: Payer: Self-pay | Admitting: Gastroenterology

## 2013-11-26 ENCOUNTER — Encounter: Payer: Self-pay | Admitting: *Deleted

## 2013-11-26 ENCOUNTER — Other Ambulatory Visit (INDEPENDENT_AMBULATORY_CARE_PROVIDER_SITE_OTHER): Payer: BC Managed Care – PPO

## 2013-11-26 DIAGNOSIS — Z0000 Encounter for general adult medical examination without abnormal findings: Secondary | ICD-10-CM

## 2013-11-26 LAB — TSH: TSH: 0.95 u[IU]/mL (ref 0.35–4.50)

## 2013-11-26 LAB — LIPID PANEL
Cholesterol: 139 mg/dL (ref 0–200)
HDL: 49.8 mg/dL (ref >39.00)
LDL Cholesterol: 81 mg/dL (ref 0–99)
NonHDL: 89.2
Total CHOL/HDL Ratio: 3
Triglycerides: 42 mg/dL (ref 0.0–149.0)
VLDL: 8.4 mg/dL (ref 0.0–40.0)

## 2013-11-26 LAB — CBC WITH DIFFERENTIAL/PLATELET
Basophils Absolute: 0 10*3/uL (ref 0.0–0.1)
Basophils Relative: 0.4 % (ref 0.0–3.0)
Eosinophils Absolute: 0.4 10*3/uL (ref 0.0–0.7)
Eosinophils Relative: 3.7 % (ref 0.0–5.0)
HCT: 48 % (ref 39.0–52.0)
Hemoglobin: 16.1 g/dL (ref 13.0–17.0)
Lymphocytes Relative: 15.6 % (ref 12.0–46.0)
Lymphs Abs: 1.7 10*3/uL (ref 0.7–4.0)
MCHC: 33.6 g/dL (ref 30.0–36.0)
MCV: 92 fl (ref 78.0–100.0)
Monocytes Absolute: 0.9 10*3/uL (ref 0.1–1.0)
Monocytes Relative: 8.2 % (ref 3.0–12.0)
Neutro Abs: 7.9 10*3/uL — ABNORMAL HIGH (ref 1.4–7.7)
Neutrophils Relative %: 72.1 % (ref 43.0–77.0)
Platelets: 223 10*3/uL (ref 150.0–400.0)
RBC: 5.22 Mil/uL (ref 4.22–5.81)
RDW: 14.2 % (ref 11.5–15.5)
WBC: 10.9 10*3/uL — ABNORMAL HIGH (ref 4.0–10.5)

## 2013-11-26 LAB — POCT URINALYSIS DIPSTICK
Bilirubin, UA: NEGATIVE
Blood, UA: NEGATIVE
Glucose, UA: NEGATIVE
Ketones, UA: NEGATIVE
Leukocytes, UA: NEGATIVE
Nitrite, UA: NEGATIVE
Protein, UA: NEGATIVE
Spec Grav, UA: 1.01
Urobilinogen, UA: 0.2
pH, UA: 7

## 2013-11-26 LAB — PSA: PSA: 0.74 ng/mL (ref 0.10–4.00)

## 2013-11-26 LAB — BASIC METABOLIC PANEL
BUN: 11 mg/dL (ref 6–23)
CO2: 29 mEq/L (ref 19–32)
Calcium: 9.3 mg/dL (ref 8.4–10.5)
Chloride: 99 mEq/L (ref 96–112)
Creatinine, Ser: 0.8 mg/dL (ref 0.4–1.5)
GFR: 107.84 mL/min (ref >60.00)
Glucose, Bld: 94 mg/dL (ref 70–99)
Potassium: 4.1 mEq/L (ref 3.5–5.1)
Sodium: 134 mEq/L — ABNORMAL LOW (ref 135–145)

## 2013-11-26 LAB — HEPATIC FUNCTION PANEL
ALT: 24 U/L (ref 0–53)
AST: 23 U/L (ref 0–37)
Albumin: 3.9 g/dL (ref 3.5–5.2)
Alkaline Phosphatase: 106 U/L (ref 39–117)
Bilirubin, Direct: 0.2 mg/dL (ref 0.0–0.3)
Total Bilirubin: 0.9 mg/dL (ref 0.2–1.2)
Total Protein: 6.7 g/dL (ref 6.0–8.3)

## 2013-12-02 ENCOUNTER — Other Ambulatory Visit: Payer: Self-pay | Admitting: Internal Medicine

## 2013-12-03 ENCOUNTER — Encounter: Payer: Self-pay | Admitting: Internal Medicine

## 2013-12-03 ENCOUNTER — Ambulatory Visit (INDEPENDENT_AMBULATORY_CARE_PROVIDER_SITE_OTHER): Payer: BC Managed Care – PPO | Admitting: Internal Medicine

## 2013-12-03 VITALS — BP 120/74 | HR 76 | Temp 98.3°F | Ht 72.0 in | Wt 193.0 lb

## 2013-12-03 DIAGNOSIS — I350 Nonrheumatic aortic (valve) stenosis: Secondary | ICD-10-CM

## 2013-12-03 DIAGNOSIS — M533 Sacrococcygeal disorders, not elsewhere classified: Secondary | ICD-10-CM

## 2013-12-03 DIAGNOSIS — M4328 Fusion of spine, sacral and sacrococcygeal region: Secondary | ICD-10-CM

## 2013-12-03 DIAGNOSIS — I2510 Atherosclerotic heart disease of native coronary artery without angina pectoris: Secondary | ICD-10-CM

## 2013-12-03 DIAGNOSIS — I359 Nonrheumatic aortic valve disorder, unspecified: Secondary | ICD-10-CM

## 2013-12-03 DIAGNOSIS — Z0000 Encounter for general adult medical examination without abnormal findings: Secondary | ICD-10-CM

## 2013-12-03 DIAGNOSIS — I10 Essential (primary) hypertension: Secondary | ICD-10-CM

## 2013-12-03 DIAGNOSIS — F17200 Nicotine dependence, unspecified, uncomplicated: Secondary | ICD-10-CM

## 2013-12-03 MED ORDER — SILDENAFIL CITRATE 50 MG PO TABS: ORAL_TABLET | ORAL | Status: DC

## 2013-12-03 MED ORDER — HYDROCODONE-ACETAMINOPHEN 5-325 MG PO TABS: 2.0000 | ORAL_TABLET | ORAL | Status: DC | PRN

## 2013-12-03 MED ORDER — ATENOLOL 50 MG PO TABS: ORAL_TABLET | ORAL | Status: DC

## 2013-12-03 MED ORDER — ATORVASTATIN CALCIUM 80 MG PO TABS: 80.0000 mg | ORAL_TABLET | Freq: Every day | ORAL | Status: DC

## 2013-12-03 NOTE — Patient Instructions (Signed)
Limit your sodium (Salt) intake    It is important that you exercise regularly, at least 20 minutes 3 to 4 times per week.  If you develop chest pain or shortness of breath seek  medical attention.  Return in one year for follow-up  Smoking tobacco is very bad for your health. You should stop smoking immediately.  Health Maintenance A healthy lifestyle and preventative care can promote health and wellness.  Maintain regular health, dental, and eye exams.  Eat a healthy diet. Foods like vegetables, fruits, whole grains, low-fat dairy products, and lean protein foods contain the nutrients you need and are low in calories. Decrease your intake of foods high in solid fats, added sugars, and salt. Get information about a proper diet from your health care provider, if necessary.  Regular physical exercise is one of the most important things you can do for your health. Most adults should get at least 150 minutes of moderate-intensity exercise (any activity that increases your heart rate and causes you to sweat) each week. In addition, most adults need muscle-strengthening exercises on 2 or more days a week.   Maintain a healthy weight. The body mass index (BMI) is a screening tool to identify possible weight problems. It provides an estimate of body fat based on height and weight. Your health care provider can find your BMI and can help you achieve or maintain a healthy weight. For males 20 years and older:  A BMI below 18.5 is considered underweight.  A BMI of 18.5 to 24.9 is normal.  A BMI of 25 to 29.9 is considered overweight.  A BMI of 30 and above is considered obese.  Maintain normal blood lipids and cholesterol by exercising and minimizing your intake of saturated fat. Eat a balanced diet with plenty of fruits and vegetables. Blood tests for lipids and cholesterol should begin at age 21 and be repeated every 5 years. If your lipid or cholesterol levels are high, you are over age 89, or  you are at high risk for heart disease, you may need your cholesterol levels checked more frequently.Ongoing high lipid and cholesterol levels should be treated with medicines if diet and exercise are not working.  If you smoke, find out from your health care provider how to quit. If you do not use tobacco, do not start.  Lung cancer screening is recommended for adults aged 33-80 years who are at high risk for developing lung cancer because of a history of smoking. A yearly low-dose CT scan of the lungs is recommended for people who have at least a 30-pack-year history of smoking and are current smokers or have quit within the past 15 years. A pack year of smoking is smoking an average of 1 pack of cigarettes a day for 1 year (for example, a 30-pack-year history of smoking could mean smoking 1 pack a day for 30 years or 2 packs a day for 15 years). Yearly screening should continue until the smoker has stopped smoking for at least 15 years. Yearly screening should be stopped for people who develop a health problem that would prevent them from having lung cancer treatment.  If you choose to drink alcohol, do not have more than 2 drinks per day. One drink is considered to be 12 oz (360 mL) of beer, 5 oz (150 mL) of wine, or 1.5 oz (45 mL) of liquor.  Avoid the use of street drugs. Do not share needles with anyone. Ask for help if you need support  or instructions about stopping the use of drugs.  High blood pressure causes heart disease and increases the risk of stroke. Blood pressure should be checked at least every 1-2 years. Ongoing high blood pressure should be treated with medicines if weight loss and exercise are not effective.  If you are 59-88 years old, ask your health care provider if you should take aspirin to prevent heart disease.  Diabetes screening involves taking a blood sample to check your fasting blood sugar level. This should be done once every 3 years after age 41 if you are at a  normal weight and without risk factors for diabetes. Testing should be considered at a younger age or be carried out more frequently if you are overweight and have at least 1 risk factor for diabetes.  Colorectal cancer can be detected and often prevented. Most routine colorectal cancer screening begins at the age of 35 and continues through age 61. However, your health care provider may recommend screening at an earlier age if you have risk factors for colon cancer. On a yearly basis, your health care provider may provide home test kits to check for hidden blood in the stool. A small camera at the end of a tube may be used to directly examine the colon (sigmoidoscopy or colonoscopy) to detect the earliest forms of colorectal cancer. Talk to your health care provider about this at age 59 when routine screening begins. A direct exam of the colon should be repeated every 5-10 years through age 3, unless early forms of precancerous polyps or small growths are found.  People who are at an increased risk for hepatitis B should be screened for this virus. You are considered at high risk for hepatitis B if:  You were born in a country where hepatitis B occurs often. Talk with your health care provider about which countries are considered high risk.  Your parents were born in a high-risk country and you have not received a shot to protect against hepatitis B (hepatitis B vaccine).  You have HIV or AIDS.  You use needles to inject street drugs.  You live with, or have sex with, someone who has hepatitis B.  You are a man who has sex with other men (MSM).  You get hemodialysis treatment.  You take certain medicines for conditions like cancer, organ transplantation, and autoimmune conditions.  Hepatitis C blood testing is recommended for all people born from [birth year] through 1967 and any individual with known risk factors for hepatitis C.  Healthy men should no longer receive prostate-specific antigen  (PSA) blood tests as part of routine cancer screening. Talk to your health care provider about prostate cancer screening.  Testicular cancer screening is not recommended for adolescents or adult males who have no symptoms. Screening includes self-exam, a health care provider exam, and other screening tests. Consult with your health care provider about any symptoms you have or any concerns you have about testicular cancer.  Practice safe sex. Use condoms and avoid high-risk sexual practices to reduce the spread of sexually transmitted infections (STIs).  You should be screened for STIs, including gonorrhea and chlamydia if:  You are sexually active and are younger than 24 years.  You are older than 24 years, and your health care provider tells you that you are at risk for this type of infection.  Your sexual activity has changed since you were last screened, and you are at an increased risk for chlamydia or gonorrhea. Ask your health care provider  if you are at risk.  If you are at risk of being infected with HIV, it is recommended that you take a prescription medicine daily to prevent HIV infection. This is called pre-exposure prophylaxis (PrEP). You are considered at risk if:  You are a man who has sex with other men (MSM).  You are a heterosexual man who is sexually active with multiple partners.  You take drugs by injection.  You are sexually active with a partner who has HIV.  Talk with your health care provider about whether you are at high risk of being infected with HIV. If you choose to begin PrEP, you should first be tested for HIV. You should then be tested every 3 months for as long as you are taking PrEP.  Use sunscreen. Apply sunscreen liberally and repeatedly throughout the day. You should seek shade when your shadow is shorter than you. Protect yourself by wearing long sleeves, pants, a wide-brimmed hat, and sunglasses year round whenever you are outdoors.  Tell your health  care provider of new moles or changes in moles, especially if there is a change in shape or color. Also, tell your health care provider if a mole is larger than the size of a pencil eraser.  A one-time screening for abdominal aortic aneurysm (AAA) and surgical repair of large AAAs by ultrasound is recommended for men aged 28-75 years who are current or former smokers.  Stay current with your vaccines (immunizations). Document Released: 10/26/2007 Document Revised: 05/04/2013 Document Reviewed: 09/24/2010 [facility] Patient Information 2015 [place], [place]. This information is not intended to replace advice given to you by your health care provider. Make sure you discuss any questions you have with your health care provider.

## 2013-12-03 NOTE — Progress Notes (Signed)
Patient ID: Brandon [name], male   DOB: 12-30-1955, 58 y.o.   MRN: [id number]  Subjective:    Patient ID: Brandon [name], male    DOB: 07-Nov-1955, 58 y.o.   MRN: [id number]  HPI  4  ear-old patient who is in today for an annual exam. Medical problems include coronary artery disease which has been stable. Since his last preventive health examination he has had a lumbar fusion. This has resulted in a very nice clinical response with resolution of his low back pain. He is followed by rheumatology for ankylosing spondylitis. History of hypertension and dyslipidemia. Unfortunately he continues to smoke one half pack of cigarettes daily.  He has history of colonic polyps and is status post followup colonoscopy 2012.  He is scheduled for followup in August of 2015  And 2013 he had a 2-D echocardiogram as well as a nuclear stress test  Past Medical History  Diagnosis Date  . BENIGN PROSTATIC HYPERTROPHY 06/07/2008  . COLONIC POLYPS, HX OF 06/07/2008  . CORONARY ARTERY DISEASE 06/07/2008  . HYPERLIPIDEMIA 06/07/2008  . HYPERTENSION 06/07/2008  . NEPHROLITHIASIS, HX OF 06/07/2008  . Ankylosing spondylitis   . Myocardial infarction   . H/O hiatal hernia   . Heart murmur   . Arthritis     History   Social History  . Marital Status: Single    Spouse Name: N/A    Number of Children: N/A  . Years of Education: N/A   Occupational History  . Manager    Social History Main Topics  . Smoking status: Current Every Day Smoker -- 0.10 packs/day for 40 years    Types: Cigarettes  . Smokeless tobacco: Never Used  . Alcohol Use: 9.0 oz/week    15 Cans of beer per week  . Drug Use: No  . Sexual Activity: Not on file   Other Topics Concern  . Not on file   Social History Narrative  . No narrative on file    Past Surgical History  Procedure Laterality Date  . Laminectomy      lumbar  . Coronary angioplasty with stent placement      Stent- approx 2008  . Tonsillectomy      Family History   Problem Relation Age of Onset  . Depression Father   . Arthritis Sister     RA  . Heart failure Mother   . Other Neg Hx     No Known Allergies  Current Outpatient Prescriptions on File Prior to Visit  Medication Sig Dispense Refill  . aspirin 325 MG EC tablet Take 325 mg by mouth daily.      [name] atenolol (TENORMIN) 50 MG tablet take 1 tablet by mouth once daily  90 tablet  0  . atorvastatin (LIPITOR) 80 MG tablet take 1 tablet by mouth once daily  90 tablet  1  . etanercept (ENBREL) 50 MG/ML injection Inject 50 mg into the skin once a week. Sunday       . Multiple Vitamin (MULTIVITAMIN WITH MINERALS) TABS Take 1 tablet by mouth daily.      . tamsulosin (FLOMAX) 0.4 MG CAPS capsule take 1 capsule by mouth once daily  90 capsule  1  . telmisartan-hydrochlorothiazide (MICARDIS HCT) 80-12.5 MG per tablet take 1 tablet by mouth once daily  90 tablet  0  . VIAGRA 50 MG tablet take 1 tablet by mouth once daily if needed  10 tablet  6   No current facility-administered medications on file prior  to visit.    BP 120/74  Pulse 76  Temp(Src) 98.3 F (36.8 C) (Oral)  Ht 6' (1.829 m)  Wt 193 lb (87.544 kg)  BMI 26.17 kg/m2      Review of Systems  Constitutional: Negative for fever, chills, activity change, appetite change and fatigue.  HENT: Negative for congestion, dental problem, ear pain, hearing loss, mouth sores, rhinorrhea, sinus pressure, sneezing, tinnitus, trouble swallowing and voice change.   Eyes: Negative for photophobia, pain, redness and visual disturbance.  Respiratory: Negative for apnea, cough, choking, chest tightness, shortness of breath and wheezing.   Cardiovascular: Negative for chest pain, palpitations and leg swelling.  Gastrointestinal: Negative for nausea, vomiting, abdominal pain, diarrhea, constipation, blood in stool, abdominal distention, anal bleeding and rectal pain.  Genitourinary: Negative for dysuria, urgency, frequency, hematuria, flank pain,  decreased urine volume, discharge, penile swelling, scrotal swelling, difficulty urinating, genital sores and testicular pain.  Musculoskeletal: Positive for back pain. Negative for arthralgias, gait problem, joint swelling, myalgias, neck pain and neck stiffness.  Skin: Negative for color change, rash and wound.  Neurological: Negative for dizziness, tremors, seizures, syncope, facial asymmetry, speech difficulty, weakness, light-headedness, numbness and headaches.  Hematological: Negative for adenopathy. Does not bruise/bleed easily.  Psychiatric/Behavioral: Negative for suicidal ideas, hallucinations, behavioral problems, confusion, sleep disturbance, self-injury, dysphoric mood, decreased concentration and agitation. The patient is not nervous/anxious.        Objective:   Physical Exam  Constitutional: He appears well-developed and well-nourished.  HENT:  Head: Normocephalic and atraumatic.  Right Ear: External ear normal.  Left Ear: External ear normal.  Nose: Nose normal.  Mouth/Throat: Oropharynx is clear and moist.  Eyes: Conjunctivae and EOM are normal. Pupils are equal, round, and reactive to light. No scleral icterus.  Neck: Neck supple. No JVD present. No thyromegaly present.  Moderate decreased range of motion of cervical and lumbar spine  Cardiovascular: Normal rate, regular rhythm and intact distal pulses.  Exam reveals no gallop and no friction rub.   Murmur heard. Grade 2/6 systolic murmur loudest at the base  Pulmonary/Chest: Effort normal and breath sounds normal. He exhibits no tenderness.  Abdominal: Soft. Bowel sounds are normal. He exhibits no distension and no mass. There is no tenderness.  Mild ventral hernia  Genitourinary: Prostate normal and penis normal. Guaiac negative stool.  Musculoskeletal: Normal range of motion. He exhibits no edema and no tenderness.  Lymphadenopathy:    He has no cervical adenopathy.  Neurological: He is alert. He has normal  reflexes. No cranial nerve deficit. Coordination normal.  Skin: Skin is warm and dry. No rash noted.  Psychiatric: He has a normal mood and affect. His behavior is normal.          Assessment & Plan:   Preventive health examination Coronary artery disease stable Ongoing tobacco use Hypertension controlled Dyslipidemia controlled Mild aortic stenosis History chronic polyps.  Followup colonoscopy next month  Follow rheumatology and orthopedics Recheck here one year Total cessation of smoking encouraged All medications refilled

## 2013-12-03 NOTE — Progress Notes (Signed)
Pre visit review using our clinic review tool, if applicable. No additional management support is needed unless otherwise documented below in the visit note. 

## 2013-12-10 ENCOUNTER — Encounter: Payer: Self-pay | Admitting: Internal Medicine

## 2013-12-23 ENCOUNTER — Ambulatory Visit (AMBULATORY_SURGERY_CENTER): Payer: Self-pay | Admitting: *Deleted

## 2013-12-23 VITALS — Ht 72.0 in | Wt 196.0 lb

## 2013-12-23 DIAGNOSIS — Z86010 Personal history of colonic polyps: Secondary | ICD-10-CM

## 2013-12-23 MED ORDER — NA SULFATE-K SULFATE-MG SULF 17.5-3.13-1.6 GM/177ML PO SOLN: 1.0000 | Freq: Once | ORAL | Status: DC

## 2013-12-23 NOTE — Progress Notes (Signed)
Denies allergies to eggs or soy products. Denies complications with sedation or anesthesia. Denies O2 use. Denies use of diet or weight loss medications.  Emmi instructions given for colonoscopy.  

## 2013-12-29 ENCOUNTER — Encounter: Payer: Self-pay | Admitting: Gastroenterology

## 2014-01-01 ENCOUNTER — Encounter: Payer: Self-pay | Admitting: Gastroenterology

## 2014-01-05 ENCOUNTER — Ambulatory Visit (AMBULATORY_SURGERY_CENTER): Payer: BC Managed Care – PPO | Admitting: Gastroenterology

## 2014-01-05 ENCOUNTER — Encounter: Payer: Self-pay | Admitting: Gastroenterology

## 2014-01-05 VITALS — BP 134/84 | HR 79 | Temp 97.2°F | Resp 24 | Ht 72.0 in | Wt 196.0 lb

## 2014-01-05 DIAGNOSIS — Z86010 Personal history of colonic polyps: Secondary | ICD-10-CM

## 2014-01-05 DIAGNOSIS — D126 Benign neoplasm of colon, unspecified: Secondary | ICD-10-CM

## 2014-01-05 DIAGNOSIS — K5730 Diverticulosis of large intestine without perforation or abscess without bleeding: Secondary | ICD-10-CM

## 2014-01-05 DIAGNOSIS — K648 Other hemorrhoids: Secondary | ICD-10-CM

## 2014-01-05 MED ORDER — SODIUM CHLORIDE 0.9 % IV SOLN: 500.0000 mL | INTRAVENOUS | Status: DC

## 2014-01-05 NOTE — Patient Instructions (Signed)
Findings;  Polyps, Diverticulosis, Hemorrhoids Recommendations:  Repeat colonoscopy determined by pathology.  YOU HAD AN ENDOSCOPIC PROCEDURE TODAY AT [facility]: Refer to the procedure report that was given to you for any specific questions about what was found during the examination.  If the procedure report does not answer your questions, please call your gastroenterologist to clarify.  If you requested that your care partner not be given the details of your procedure findings, then the procedure report has been included in a sealed envelope for you to review at your convenience later.  YOU SHOULD EXPECT: Some feelings of bloating in the abdomen. Passage of more gas than usual.  Walking can help get rid of the air that was put into your GI tract during the procedure and reduce the bloating. If you had a lower endoscopy (such as a colonoscopy or flexible sigmoidoscopy) you may notice spotting of blood in your stool or on the toilet paper. If you underwent a bowel prep for your procedure, then you may not have a normal bowel movement for a few days.  DIET: Your first meal following the procedure should be a light meal and then it is ok to progress to your normal diet.  A half-sandwich or bowl of soup is an example of a good first meal.  Heavy or fried foods are harder to digest and may make you feel nauseous or bloated.  Likewise meals heavy in dairy and vegetables can cause extra gas to form and this can also increase the bloating.  Drink plenty of fluids but you should avoid alcoholic beverages for 24 hours.  ACTIVITY: Your care partner should take you home directly after the procedure.  You should plan to take it easy, moving slowly for the rest of the day.  You can resume normal activity the day after the procedure however you should NOT DRIVE or use heavy machinery for 24 hours (because of the sedation medicines used during the test).    SYMPTOMS TO REPORT IMMEDIATELY: A  gastroenterologist can be reached at any hour.  During normal business hours, 8:30 AM to 5:00 PM Monday through Friday, call [phone].  After hours and on weekends, please call the GI answering service at [phone] who will take a message and have the physician on call contact you.   Following lower endoscopy (colonoscopy or flexible sigmoidoscopy):  Excessive amounts of blood in the stool  Significant tenderness or worsening of abdominal pains  Swelling of the abdomen that is new, acute  Fever of 100F or higher  Following upper endoscopy (EGD)  Vomiting of blood or coffee ground material  New chest pain or pain under the shoulder blades  Painful or persistently difficult swallowing  New shortness of breath  Fever of 100F or higher  Black, tarry-looking stools  FOLLOW UP: If any biopsies were taken you will be contacted by phone or by letter within the next 1-3 weeks.  Call your gastroenterologist if you have not heard about the biopsies in 3 weeks.  Our staff will call the home number listed on your records the next business day following your procedure to check on you and address any questions or concerns that you may have at that time regarding the information given to you following your procedure. This is a courtesy call and so if there is no answer at the home number and we have not heard from you through the emergency physician on call, we will assume that you have returned  to your regular daily activities without incident.  SIGNATURES/CONFIDENTIALITY: You and/or your care partner have signed paperwork which will be entered into your electronic medical record.  These signatures attest to the fact that that the information above on your After Visit Summary has been reviewed and is understood.  Full responsibility of the confidentiality of this discharge information lies with you and/or your care-partner.  Please follow all discharge instructions given to you by the recovery  room nurse. If you have any questions or problems after discharge please call one of the numbers listed above. You will receive a phone call in the am to see how you are doing and answer any questions you may have. Thank you for choosing [facility] for your health care needs.  Diverticulosis Diverticulosis is the condition that develops when small pouches (diverticula) form in the wall of your colon. Your colon, or large intestine, is where water is absorbed and stool is formed. The pouches form when the inside layer of your colon pushes through weak spots in the outer layers of your colon. CAUSES  No one knows exactly what causes diverticulosis. RISK FACTORS  Being older than 44. Your risk for this condition increases with age. Diverticulosis is rare in people younger than 40 years. By age 66, almost everyone has it.  Eating a low-fiber diet.  Being frequently constipated.  Being overweight.  Not getting enough exercise.  Smoking.  Taking over-the-counter pain medicines, like aspirin and ibuprofen. SYMPTOMS  Most people with diverticulosis do not have symptoms. DIAGNOSIS  Because diverticulosis often has no symptoms, health care providers often discover the condition during an exam for other colon problems. In many cases, a health care provider will diagnose diverticulosis while using a flexible scope to examine the colon (colonoscopy). TREATMENT  If you have never developed an infection related to diverticulosis, you may not need treatment. If you have had an infection before, treatment may include:  Eating more fruits, vegetables, and grains.  Taking a fiber supplement.  Taking a live bacteria supplement (probiotic).  Taking medicine to relax your colon. HOME CARE INSTRUCTIONS   Drink at least 6-8 glasses of water each day to prevent constipation.  Try not to strain when you have a bowel movement.  Keep all follow-up appointments. If you have had an  infection before:  Increase the fiber in your diet as directed by your health care provider or dietitian.  Take a dietary fiber supplement if your health care provider approves.  Only take medicines as directed by your health care provider. SEEK MEDICAL CARE IF:   You have abdominal pain.  You have bloating.  You have cramps.  You have not gone to the bathroom in 3 days. SEEK IMMEDIATE MEDICAL CARE IF:   Your pain gets worse.  Yourbloating becomes very bad.  You have a fever or chills, and your symptoms suddenly get worse.  You begin vomiting.  You have bowel movements that are bloody or black. MAKE SURE YOU:  Understand these instructions.  Will watch your condition.  Will get help right away if you are not doing well or get worse. Document Released: 01/25/2004 Document Revised: 05/04/2013 Document Reviewed: 03/24/2013 [facility] Patient Information 2015 [place], [place]. This information is not intended to replace advice given to you by your health care provider. Make sure you discuss any questions you have with your health care provider.

## 2014-01-05 NOTE — Progress Notes (Signed)
Information and card given to pt and caregiver explaining clip placed on polyp site in colon.

## 2014-01-05 NOTE — Progress Notes (Signed)
Called to room to assist during endoscopic procedure.  Patient ID and intended procedure confirmed with present staff. Received instructions for my participation in the procedure from the performing physician.  

## 2014-01-05 NOTE — Op Note (Signed)
[facility]  [employer] [place], [id number]   COLONOSCOPY PROCEDURE REPORT  PATIENT: [name], Brandon  MR#: [id number] BIRTHDATE: 07/25/1955 , 58  yrs. old GENDER: Male ENDOSCOPIST: Inda Castle, MD REFERRED BY: PROCEDURE DATE:  01/05/2014 PROCEDURE:   Colonoscopy with snare polypectomy First Screening Colonoscopy - Avg.  risk and is 50 yrs.  old or older - No.  Prior Negative Screening - Now for repeat screening. N/A  History of Adenoma - Now for follow-up colonoscopy & has been > or = to 3 yrs.  Yes hx of adenoma.  Has been 3 or more years since last colonoscopy.  Polyps Removed Today? Yes. ASA CLASS:   Class II INDICATIONS:Patient's personal history of colon polyps. MEDICATIONS: propofol (Diprivan) 300mg  IV and MAC sedation, administered by CRNA  DESCRIPTION OF PROCEDURE:   After the risks benefits and alternatives of the procedure were thoroughly explained, informed consent was obtained.  A digital rectal exam revealed no abnormalities of the rectum.   The LB [id number]  endoscope was introduced through the anus and advanced to the cecum, which was identified by both the appendix and ileocecal valve. No adverse events experienced.   The quality of the prep was excellent using Suprep  The instrument was then slowly withdrawn as the colon was fully examined.      COLON FINDINGS: A sessile polyp measuring 13 mm in size was found in the ascending colon.  A polypectomy was performed using snare cautery.  The resection was complete and the polyp tissue was completely retrieved.  The wound at the site was closed by placing hemoclips.  One (1) placement was made.  One (1) placement was made.   A sessile polyp measuring 3 mm in size was found in the sigmoid colon.  A polypectomy was performed with a cold snare.  The resection was complete and the polyp tissue was completely retrieved.   Mild diverticulosis was noted in the sigmoid colon. Internal  hemorrhoids were found.  Retroflexed views revealed no abnormalities. The time to cecum=1 minutes 58 seconds.  Withdrawal time=7 minutes 58 seconds.  The scope was withdrawn and the procedure completed. COMPLICATIONS: There were no complications.  ENDOSCOPIC IMPRESSION: 1.   Sessile polyp measuring 13 mm in size was found in the ascending colon; polypectomy was performed using snare cautery; the wound at the site was closed by placing hemoclips 2.   Sessile polyp measuring 3 mm in size was found in the sigmoid colon; polypectomy was performed with a cold snare 3.   Mild diverticulosis was noted in the sigmoid colon 4.   Internal hemorrhoids  RECOMMENDATIONS: If the polyp(s) removed today are proven to be adenomatous (pre-cancerous) polyps, you will need a colonoscopy in 3 years. Otherwise you should continue to follow colorectal cancer screening guidelines for "routine risk" patients with a colonoscopy in 10 years.  You will receive a letter within 1-2 weeks with the results of your biopsy as well as final recommendations.  Please call my office if you have not received a letter after 3 weeks.   eSigned:  Inda Castle, MD 01/05/2014 9:41 AM   cc: Marletta Lor, MD   PATIENT NAME:  Brandon, [name] MR#: [id number]

## 2014-01-05 NOTE — Progress Notes (Signed)
A/ox3 pleased with MAC, report to Tracy RN 

## 2014-01-06 ENCOUNTER — Telehealth: Payer: Self-pay | Admitting: *Deleted

## 2014-01-06 NOTE — Telephone Encounter (Signed)
  Follow up Call-  Call back number 01/05/2014  Post procedure Call Back phone  # [phone]  Permission to leave phone message Yes     Patient questions:  Do you have a fever, pain , or abdominal swelling? No. Pain Score  0 *  Have you tolerated food without any problems? Yes.    Have you been able to return to your normal activities? Yes.    Do you have any questions about your discharge instructions: Diet   No. Medications  No. Follow up visit  No.  Do you have questions or concerns about your Care? No.  Actions: * If pain score is 4 or above: No action needed, pain <4.

## 2014-01-10 ENCOUNTER — Other Ambulatory Visit: Payer: Self-pay | Admitting: Internal Medicine

## 2014-01-11 ENCOUNTER — Encounter: Payer: Self-pay | Admitting: Gastroenterology

## 2014-01-30 ENCOUNTER — Other Ambulatory Visit: Payer: Self-pay | Admitting: Internal Medicine

## 2014-06-27 ENCOUNTER — Other Ambulatory Visit: Payer: Self-pay | Admitting: Internal Medicine

## 2014-07-25 ENCOUNTER — Other Ambulatory Visit: Payer: Self-pay | Admitting: Internal Medicine

## 2014-10-13 ENCOUNTER — Telehealth: Payer: Self-pay | Admitting: Internal Medicine

## 2014-10-13 NOTE — Telephone Encounter (Signed)
Pt no longer want hydrocodone. Pt tried his sister motrin 800 mg and that works great. Pt would like new rx motrin 800 mg call into [facility]

## 2014-10-14 MED ORDER — IBUPROFEN 800 MG PO TABS: 800.0000 mg | ORAL_TABLET | Freq: Three times a day (TID) | ORAL | Status: DC | PRN

## 2014-10-14 NOTE — Telephone Encounter (Signed)
#  90  one 3 times daily as needed.  Refill times 4

## 2014-10-14 NOTE — Telephone Encounter (Signed)
Please see message and advise 

## 2014-10-14 NOTE — Telephone Encounter (Signed)
Pt notified Rx for Motrin 800 mg was sent to pharmacy. Pt verbalized understanding.

## 2014-12-01 ENCOUNTER — Other Ambulatory Visit (INDEPENDENT_AMBULATORY_CARE_PROVIDER_SITE_OTHER): Payer: BLUE CROSS/BLUE SHIELD

## 2014-12-01 DIAGNOSIS — Z0000 Encounter for general adult medical examination without abnormal findings: Secondary | ICD-10-CM

## 2014-12-01 LAB — CBC WITH DIFFERENTIAL/PLATELET
Basophils Absolute: 0 10*3/uL (ref 0.0–0.1)
Basophils Relative: 0.4 % (ref 0.0–3.0)
Eosinophils Absolute: 0.4 10*3/uL (ref 0.0–0.7)
Eosinophils Relative: 4.2 % (ref 0.0–5.0)
HCT: 50.8 % (ref 39.0–52.0)
Hemoglobin: 17.2 g/dL — ABNORMAL HIGH (ref 13.0–17.0)
Lymphocytes Relative: 16.7 % (ref 12.0–46.0)
Lymphs Abs: 1.7 10*3/uL (ref 0.7–4.0)
MCHC: 33.8 g/dL (ref 30.0–36.0)
MCV: 90.3 fl (ref 78.0–100.0)
Monocytes Absolute: 0.8 10*3/uL (ref 0.1–1.0)
Monocytes Relative: 7.4 % (ref 3.0–12.0)
Neutro Abs: 7.2 10*3/uL (ref 1.4–7.7)
Neutrophils Relative %: 71.3 % (ref 43.0–77.0)
Platelets: 224 10*3/uL (ref 150.0–400.0)
RBC: 5.62 Mil/uL (ref 4.22–5.81)
RDW: 14.4 % (ref 11.5–15.5)
WBC: 10.1 10*3/uL (ref 4.0–10.5)

## 2014-12-01 LAB — BASIC METABOLIC PANEL
BUN: 9 mg/dL (ref 6–23)
CO2: 30 mEq/L (ref 19–32)
Calcium: 9.6 mg/dL (ref 8.4–10.5)
Chloride: 99 mEq/L (ref 96–112)
Creatinine, Ser: 0.83 mg/dL (ref 0.40–1.50)
GFR: 100.04 mL/min (ref >60.00)
Glucose, Bld: 98 mg/dL (ref 70–99)
Potassium: 4.8 mEq/L (ref 3.5–5.1)
Sodium: 135 mEq/L (ref 135–145)

## 2014-12-01 LAB — POCT URINALYSIS DIPSTICK
Bilirubin, UA: NEGATIVE
Blood, UA: NEGATIVE
Glucose, UA: NEGATIVE
Ketones, UA: NEGATIVE
Leukocytes, UA: NEGATIVE
Nitrite, UA: NEGATIVE
Protein, UA: NEGATIVE
Spec Grav, UA: 1.01
Urobilinogen, UA: 0.2
pH, UA: 7

## 2014-12-01 LAB — HEPATIC FUNCTION PANEL
ALT: 20 U/L (ref 0–53)
AST: 19 U/L (ref 0–37)
Albumin: 4.2 g/dL (ref 3.5–5.2)
Alkaline Phosphatase: 119 U/L — ABNORMAL HIGH (ref 39–117)
Bilirubin, Direct: 0.2 mg/dL (ref 0.0–0.3)
Total Bilirubin: 0.8 mg/dL (ref 0.2–1.2)
Total Protein: 6.8 g/dL (ref 6.0–8.3)

## 2014-12-01 LAB — TSH: TSH: 0.9 u[IU]/mL (ref 0.35–4.50)

## 2014-12-01 LAB — LIPID PANEL
Cholesterol: 157 mg/dL (ref 0–200)
HDL: 46.9 mg/dL (ref >39.00)
LDL Cholesterol: 96 mg/dL (ref 0–99)
NonHDL: 110.1
Total CHOL/HDL Ratio: 3
Triglycerides: 69 mg/dL (ref 0.0–149.0)
VLDL: 13.8 mg/dL (ref 0.0–40.0)

## 2014-12-01 LAB — PSA: PSA: 1 ng/mL (ref 0.10–4.00)

## 2014-12-08 ENCOUNTER — Encounter: Payer: Self-pay | Admitting: Internal Medicine

## 2014-12-08 ENCOUNTER — Ambulatory Visit (INDEPENDENT_AMBULATORY_CARE_PROVIDER_SITE_OTHER): Payer: BLUE CROSS/BLUE SHIELD | Admitting: Internal Medicine

## 2014-12-08 VITALS — BP 140/84 | HR 83 | Temp 98.5°F | Resp 20 | Ht 73.0 in | Wt 202.0 lb

## 2014-12-08 DIAGNOSIS — I350 Nonrheumatic aortic (valve) stenosis: Secondary | ICD-10-CM | POA: Diagnosis not present

## 2014-12-08 DIAGNOSIS — I2510 Atherosclerotic heart disease of native coronary artery without angina pectoris: Secondary | ICD-10-CM | POA: Diagnosis not present

## 2014-12-08 DIAGNOSIS — Z0000 Encounter for general adult medical examination without abnormal findings: Secondary | ICD-10-CM | POA: Diagnosis not present

## 2014-12-08 NOTE — Patient Instructions (Signed)
Limit your sodium (Salt) intake    It is important that you exercise regularly, at least 20 minutes 3 to 4 times per week.  If you develop chest pain or shortness of breath seek  medical attention.  Smoking tobacco is very bad for your health. You should stop smoking immediately.

## 2014-12-08 NOTE — Progress Notes (Signed)
Subjective:    Patient ID: NABIL BUBOLZ, male    DOB: 1955/05/24, 59 y.o.   MRN: [id number]  HPI  Patient ID: HARVEY MATLACK, male   DOB: 1955/06/11, 59 y.o.   MRN: [id number]  Subjective:    Patient ID: PAPE PARSON, male    DOB: October 28, 1955, 59 y.o.   MRN: [id number]  HPI  32  ear-old patient who is in today for an annual exam. Medical problems include coronary artery disease which has been stable. Since his last preventive health examination he has had a lumbar fusion. This has resulted in a very nice clinical response with resolution of his low back pain. He is followed by rheumatology for ankylosing spondylitis. History of hypertension and dyslipidemia. Unfortunately he continues to smoke one half pack of cigarettes daily.  He has history of colonic polyps and is status post followup colonoscopy 2012.  He had followup colonoscopy in August of 2015 He has some concerns in mild discomfort from a ventral hernia  And 2013 he had a 2-D echocardiogram as well as a nuclear stress test  Past Medical History  Diagnosis Date  . BENIGN PROSTATIC HYPERTROPHY 06/07/2008  . COLONIC POLYPS, HX OF 06/07/2008  . CORONARY ARTERY DISEASE 06/07/2008  . HYPERLIPIDEMIA 06/07/2008  . HYPERTENSION 06/07/2008  . NEPHROLITHIASIS, HX OF 06/07/2008  . Ankylosing spondylitis   . Myocardial infarction   . H/O hiatal hernia   . Heart murmur   . Arthritis     History   Social History  . Marital Status: Single    Spouse Name: N/A  . Number of Children: N/A  . Years of Education: N/A   Occupational History  . Manager    Social History Main Topics  . Smoking status: Current Every Day Smoker -- 0.50 packs/day for 40 years    Types: Cigarettes  . Smokeless tobacco: Never Used  . Alcohol Use: 12.0 - 15.0 oz/week    20-25 Cans of beer per week  . Drug Use: No  . Sexual Activity: Not on file   Other Topics Concern  . Not on file   Social History Narrative    Past Surgical History  Procedure  Laterality Date  . Laminectomy      lumbar  . Coronary angioplasty with stent placement      Stent- approx 2008  . Tonsillectomy    . Lumbar fusion  01/2012  . Warthin tumor removal Left 2014    Family History  Problem Relation Age of Onset  . Depression Father   . Arthritis Sister     RA  . Heart failure Mother   . Other Neg Hx   . Colon cancer Neg Hx   . Esophageal cancer Neg Hx   . Rectal cancer Neg Hx   . Stomach cancer Neg Hx     No Known Allergies  Current Outpatient Prescriptions on File Prior to Visit  Medication Sig Dispense Refill  . aspirin 325 MG EC tablet Take 325 mg by mouth daily.    [name] atenolol (TENORMIN) 50 MG tablet take 1 tablet by mouth once daily 90 tablet 6  . atorvastatin (LIPITOR) 80 MG tablet Take 1 tablet (80 mg total) by mouth daily at 6 PM. 90 tablet 6  . etanercept (ENBREL) 50 MG/ML injection Inject 50 mg into the skin once a week. Sunday     . ibuprofen (ADVIL,MOTRIN) 800 MG tablet Take 1 tablet (800 mg total) by mouth every 8 (eight) hours as  needed. 90 tablet 4  . sildenafil (VIAGRA) 50 MG tablet take 1 tablet by mouth once daily if needed 10 tablet 6  . tamsulosin (FLOMAX) 0.4 MG CAPS capsule take 1 capsule by mouth once daily 90 capsule 1  . telmisartan-hydrochlorothiazide (MICARDIS HCT) 80-12.5 MG per tablet take 1 tablet by mouth once daily 90 tablet 1   No current facility-administered medications on file prior to visit.    BP 140/84 mmHg  Pulse 83  Temp(Src) 98.5 F (36.9 C) (Oral)  Resp 20  Ht '6\' 1"'$  (1.854 m)  Wt 202 lb (91.627 kg)  BMI 26.66 kg/m2  SpO2 97%      Review of Systems  Constitutional: Negative for fever, chills, activity change, appetite change and fatigue.  HENT: Negative for congestion, dental problem, ear pain, hearing loss, mouth sores, rhinorrhea, sinus pressure, sneezing, tinnitus, trouble swallowing and voice change.   Eyes: Negative for photophobia, pain, redness and visual disturbance.  Respiratory:  Negative for apnea, cough, choking, chest tightness, shortness of breath and wheezing.   Cardiovascular: Negative for chest pain, palpitations and leg swelling.  Gastrointestinal: Negative for nausea, vomiting, abdominal pain, diarrhea, constipation, blood in stool, abdominal distention, anal bleeding and rectal pain.  Genitourinary: Negative for dysuria, urgency, frequency, hematuria, flank pain, decreased urine volume, discharge, penile swelling, scrotal swelling, difficulty urinating, genital sores and testicular pain.  Musculoskeletal: Positive for back pain. Negative for arthralgias, gait problem, joint swelling, myalgias, neck pain and neck stiffness.  Skin: Negative for color change, rash and wound.  Neurological: Negative for dizziness, tremors, seizures, syncope, facial asymmetry, speech difficulty, weakness, light-headedness, numbness and headaches.  Hematological: Negative for adenopathy. Does not bruise/bleed easily.  Psychiatric/Behavioral: Negative for suicidal ideas, hallucinations, behavioral problems, confusion, sleep disturbance, self-injury, dysphoric mood, decreased concentration and agitation. The patient is not nervous/anxious.        Objective:   Physical Exam  Constitutional: He appears well-developed and well-nourished.  HENT:  Head: Normocephalic and atraumatic.  Right Ear: External ear normal.  Left Ear: External ear normal.  Nose: Nose normal.  Mouth/Throat: Oropharynx is clear and moist.  Eyes: Conjunctivae and EOM are normal. Pupils are equal, round, and reactive to light. No scleral icterus.  Neck: Neck supple. No JVD present. No thyromegaly present.  Moderate decreased range of motion of cervical and lumbar spine  Cardiovascular: Normal rate, regular rhythm and intact distal pulses.  Exam reveals no gallop and no friction rub.   Murmur heard. Grade 2/6 systolic murmur loudest at the base  Pulmonary/Chest: Effort normal and breath sounds normal. He exhibits  no tenderness.  Abdominal: Soft. Bowel sounds are normal. He exhibits no distension and no mass. There is no tenderness.  Mild ventral hernia  Genitourinary: Prostate normal and penis normal. Guaiac negative stool.  Musculoskeletal: Normal range of motion. He exhibits no edema and no tenderness.  Lymphadenopathy:    He has no cervical adenopathy.  Neurological: He is alert. He has normal reflexes. No cranial nerve deficit. Coordination normal.  Skin: Skin is warm and dry. No rash noted.  Psychiatric: He has a normal mood and affect. His behavior is normal.          Assessment & Plan:   Preventive health examination Coronary artery disease stable Ongoing tobacco use.  Total smoking cessation encouraged Hypertension controlled Dyslipidemia controlled Mild aortic stenosis History chronic polyps.  Followup colonoscopy in 4 years Ankylosing spondylitis.  Follow-up rheumatology  Follow rheumatology and orthopedics Recheck here one year Total cessation of smoking  encouraged All medications refilled  Review of Systems  Gastrointestinal: Positive for abdominal pain.       Objective:   Physical Exam  Abdominal:  Moderate size ventral hernia          Assessment & Plan:   Preventive health exam Hypertension, well-controlled Coronary artery disease, stable Ankylosing spondylitis Ongoing tobacco use Dyslipidemia stable Mild aortic stenosis.  Remains asymptomatic

## 2014-12-08 NOTE — Progress Notes (Signed)
Pre visit review using our clinic review tool, if applicable. No additional management support is needed unless otherwise documented below in the visit note. 

## 2014-12-12 ENCOUNTER — Other Ambulatory Visit: Payer: Self-pay

## 2014-12-12 ENCOUNTER — Ambulatory Visit (HOSPITAL_COMMUNITY): Payer: BLUE CROSS/BLUE SHIELD | Attending: Cardiovascular Disease

## 2014-12-12 DIAGNOSIS — I517 Cardiomegaly: Secondary | ICD-10-CM | POA: Insufficient documentation

## 2014-12-12 DIAGNOSIS — E785 Hyperlipidemia, unspecified: Secondary | ICD-10-CM | POA: Diagnosis not present

## 2014-12-12 DIAGNOSIS — I10 Essential (primary) hypertension: Secondary | ICD-10-CM | POA: Diagnosis not present

## 2014-12-12 DIAGNOSIS — I350 Nonrheumatic aortic (valve) stenosis: Secondary | ICD-10-CM | POA: Insufficient documentation

## 2014-12-12 DIAGNOSIS — F17200 Nicotine dependence, unspecified, uncomplicated: Secondary | ICD-10-CM | POA: Insufficient documentation

## 2014-12-20 ENCOUNTER — Other Ambulatory Visit: Payer: Self-pay | Admitting: Internal Medicine

## 2015-01-11 ENCOUNTER — Other Ambulatory Visit: Payer: Self-pay | Admitting: Internal Medicine

## 2015-01-17 ENCOUNTER — Other Ambulatory Visit: Payer: Self-pay | Admitting: Internal Medicine

## 2015-02-15 ENCOUNTER — Other Ambulatory Visit: Payer: Self-pay | Admitting: Internal Medicine

## 2015-06-13 ENCOUNTER — Other Ambulatory Visit: Payer: Self-pay | Admitting: Internal Medicine

## 2015-06-13 MED ORDER — TELMISARTAN-HCTZ 80-12.5 MG PO TABS: 1.0000 | ORAL_TABLET | Freq: Every day | ORAL | Status: DC

## 2015-06-13 MED ORDER — TAMSULOSIN HCL 0.4 MG PO CAPS: 0.4000 mg | ORAL_CAPSULE | Freq: Every day | ORAL | Status: DC

## 2015-06-13 MED ORDER — IBUPROFEN 800 MG PO TABS: 800.0000 mg | ORAL_TABLET | Freq: Three times a day (TID) | ORAL | Status: DC | PRN

## 2015-06-13 MED ORDER — ATENOLOL 50 MG PO TABS: 50.0000 mg | ORAL_TABLET | Freq: Every day | ORAL | Status: DC

## 2015-06-13 MED ORDER — ATORVASTATIN CALCIUM 80 MG PO TABS: ORAL_TABLET | ORAL | Status: DC

## 2015-06-15 ENCOUNTER — Other Ambulatory Visit: Payer: Self-pay | Admitting: *Deleted

## 2015-06-15 MED ORDER — SILDENAFIL CITRATE 50 MG PO TABS: ORAL_TABLET | ORAL | Status: DC

## 2015-06-15 MED ORDER — ATORVASTATIN CALCIUM 80 MG PO TABS: ORAL_TABLET | ORAL | Status: DC

## 2015-06-15 MED ORDER — TELMISARTAN-HCTZ 80-12.5 MG PO TABS: 1.0000 | ORAL_TABLET | Freq: Every day | ORAL | Status: DC

## 2015-06-21 ENCOUNTER — Other Ambulatory Visit: Payer: Self-pay | Admitting: Internal Medicine

## 2015-06-21 MED ORDER — TAMSULOSIN HCL 0.4 MG PO CAPS: 0.4000 mg | ORAL_CAPSULE | Freq: Every day | ORAL | Status: DC

## 2015-06-21 MED ORDER — ATENOLOL 50 MG PO TABS: 50.0000 mg | ORAL_TABLET | Freq: Every day | ORAL | Status: DC

## 2015-06-21 MED ORDER — IBUPROFEN 800 MG PO TABS: 800.0000 mg | ORAL_TABLET | Freq: Three times a day (TID) | ORAL | Status: DC | PRN

## 2015-08-17 ENCOUNTER — Other Ambulatory Visit: Payer: Self-pay | Admitting: Internal Medicine

## 2015-09-20 ENCOUNTER — Other Ambulatory Visit: Payer: Self-pay | Admitting: Internal Medicine

## 2015-12-04 ENCOUNTER — Other Ambulatory Visit (INDEPENDENT_AMBULATORY_CARE_PROVIDER_SITE_OTHER): Payer: 59

## 2015-12-04 DIAGNOSIS — Z0000 Encounter for general adult medical examination without abnormal findings: Secondary | ICD-10-CM | POA: Diagnosis not present

## 2015-12-04 LAB — LIPID PANEL
Cholesterol: 141 mg/dL (ref 0–200)
HDL: 51.2 mg/dL (ref >39.00)
LDL Cholesterol: 79 mg/dL (ref 0–99)
NonHDL: 90.22
Total CHOL/HDL Ratio: 3
Triglycerides: 56 mg/dL (ref 0.0–149.0)
VLDL: 11.2 mg/dL (ref 0.0–40.0)

## 2015-12-04 LAB — CBC WITH DIFFERENTIAL/PLATELET
Basophils Absolute: 0 10*3/uL (ref 0.0–0.1)
Basophils Relative: 0.4 % (ref 0.0–3.0)
Eosinophils Absolute: 0.3 10*3/uL (ref 0.0–0.7)
Eosinophils Relative: 3.5 % (ref 0.0–5.0)
HCT: 47.7 % (ref 39.0–52.0)
Hemoglobin: 16.2 g/dL (ref 13.0–17.0)
Lymphocytes Relative: 18 % (ref 12.0–46.0)
Lymphs Abs: 1.7 10*3/uL (ref 0.7–4.0)
MCHC: 34 g/dL (ref 30.0–36.0)
MCV: 92 fl (ref 78.0–100.0)
Monocytes Absolute: 1 10*3/uL (ref 0.1–1.0)
Monocytes Relative: 10 % (ref 3.0–12.0)
Neutro Abs: 6.6 10*3/uL (ref 1.4–7.7)
Neutrophils Relative %: 68.1 % (ref 43.0–77.0)
Platelets: 225 10*3/uL (ref 150.0–400.0)
RBC: 5.18 Mil/uL (ref 4.22–5.81)
RDW: 13.5 % (ref 11.5–15.5)
WBC: 9.7 10*3/uL (ref 4.0–10.5)

## 2015-12-04 LAB — BASIC METABOLIC PANEL
BUN: 11 mg/dL (ref 6–23)
CO2: 29 mEq/L (ref 19–32)
Calcium: 9.5 mg/dL (ref 8.4–10.5)
Chloride: 96 mEq/L (ref 96–112)
Creatinine, Ser: 0.81 mg/dL (ref 0.40–1.50)
GFR: 102.55 mL/min (ref >60.00)
Glucose, Bld: 98 mg/dL (ref 70–99)
Potassium: 4.2 mEq/L (ref 3.5–5.1)
Sodium: 133 mEq/L — ABNORMAL LOW (ref 135–145)

## 2015-12-04 LAB — POC URINALSYSI DIPSTICK (AUTOMATED)
Bilirubin, UA: NEGATIVE
Glucose, UA: NEGATIVE
Ketones, UA: NEGATIVE
Leukocytes, UA: NEGATIVE
Nitrite, UA: NEGATIVE
Protein, UA: NEGATIVE
Spec Grav, UA: 1.015
Urobilinogen, UA: 0.2
pH, UA: 6.5

## 2015-12-04 LAB — HEPATIC FUNCTION PANEL
ALT: 19 U/L (ref 0–53)
AST: 18 U/L (ref 0–37)
Albumin: 4.2 g/dL (ref 3.5–5.2)
Alkaline Phosphatase: 95 U/L (ref 39–117)
Bilirubin, Direct: 0.2 mg/dL (ref 0.0–0.3)
Total Bilirubin: 0.8 mg/dL (ref 0.2–1.2)
Total Protein: 6.6 g/dL (ref 6.0–8.3)

## 2015-12-04 LAB — TSH: TSH: 0.86 u[IU]/mL (ref 0.35–4.50)

## 2015-12-11 ENCOUNTER — Ambulatory Visit (INDEPENDENT_AMBULATORY_CARE_PROVIDER_SITE_OTHER): Payer: 59 | Admitting: Internal Medicine

## 2015-12-11 ENCOUNTER — Encounter: Payer: Self-pay | Admitting: Internal Medicine

## 2015-12-11 VITALS — BP 122/80 | HR 86 | Temp 98.4°F | Ht 72.0 in | Wt 195.8 lb

## 2015-12-11 DIAGNOSIS — Z0000 Encounter for general adult medical examination without abnormal findings: Secondary | ICD-10-CM

## 2015-12-11 NOTE — Progress Notes (Signed)
Subjective:    Patient ID: JASN XIA, male    DOB: [birth year], 60 y.o.   MRN: [id number]  HPI   Patient ID: CHA GOMILLION, male   DOB: 1955/09/03, 60 y.o.   MRN: [id number]  Subjective:    Patient ID: DONIVIN WIRT, male    DOB: 1955-12-18, 60 y.o.   MRN: [id number]  HPI  60  ear-old patient who is in today for an annual exam.   Medical problems include coronary artery disease which has been stable. He has had a lumbar fusion. This has resulted in a very nice clinical response with resolution of his low back pain. He is followed by rheumatology for ankylosing spondylitis.  History of hypertension and dyslipidemia. Unfortunately he continues to smoke one half pack of cigarettes daily.   He has history of colonic polyps and is status post followup colonoscopy 2012.  He had followup colonoscopy in August of 2015 He has some concerns in mild discomfort from a ventral hernia  And 2013 he had a 2-D echocardiogram as well as a nuclear stress test.  .  Follow 2-D echo 2016 revealed mild AS only  Past Medical History:  Diagnosis Date  . Ankylosing spondylitis   . Arthritis   . BENIGN PROSTATIC HYPERTROPHY 06/07/2008  . COLONIC POLYPS, HX OF 06/07/2008  . CORONARY ARTERY DISEASE 06/07/2008  . H/O hiatal hernia   . Heart murmur   . HYPERLIPIDEMIA 06/07/2008  . HYPERTENSION 06/07/2008  . Myocardial infarction ([place])   . NEPHROLITHIASIS, HX OF 06/07/2008    Social History   Social History  . Marital status: Single    Spouse name: N/A  . Number of children: N/A  . Years of education: N/A   Occupational History  . Manager [employer]   Social History Main Topics  . Smoking status: Current Every Day Smoker    Packs/day: 0.50    Years: 40.00    Types: Cigarettes  . Smokeless tobacco: Never Used  . Alcohol use 12.0 - 15.0 oz/week    20 - 25 Cans of beer per week  . Drug use: No  . Sexual activity: Not on file   Other Topics Concern  . Not on file   Social History  Narrative  . No narrative on file    Past Surgical History:  Procedure Laterality Date  . CORONARY ANGIOPLASTY WITH STENT PLACEMENT     Stent- approx 2008  . LAMINECTOMY     lumbar  . LUMBAR FUSION  01/2012  . TONSILLECTOMY    . warthin tumor removal Left 2014    Family History  Problem Relation Age of Onset  . Depression Father   . Arthritis Sister     RA  . Heart failure Mother   . Other Neg Hx   . Colon cancer Neg Hx   . Esophageal cancer Neg Hx   . Rectal cancer Neg Hx   . Stomach cancer Neg Hx     No Known Allergies  Current Outpatient Prescriptions on File Prior to Visit  Medication Sig Dispense Refill  . aspirin 325 MG EC tablet Take 325 mg by mouth daily.    [name] atenolol (TENORMIN) 50 MG tablet Take 1 tablet (50 mg total) by mouth daily. 90 tablet 3  . atorvastatin (LIPITOR) 80 MG tablet Take 1 tablet by mouth once daily at 6pm. 90 tablet 1  . etanercept (ENBREL) 50 MG/ML injection Inject 50 mg into the skin once a week. Sunday     .  ibuprofen (ADVIL,MOTRIN) 800 MG tablet Take 1 tablet by mouth  every 8 hours as needed 180 tablet 1  . sildenafil (VIAGRA) 50 MG tablet take 1 tablet by mouth once daily if needed 10 tablet 6  . tamsulosin (FLOMAX) 0.4 MG CAPS capsule Take 1 capsule (0.4 mg total) by mouth daily. 90 capsule 3  . telmisartan-hydrochlorothiazide (MICARDIS HCT) 80-12.5 MG tablet Take 1 tablet by mouth  daily 90 tablet 1   No current facility-administered medications on file prior to visit.     There were no vitals taken for this visit.      Review of Systems  Constitutional: Negative for fever, chills, activity change, appetite change and fatigue.  HENT: Negative for congestion, dental problem, ear pain, hearing loss, mouth sores, rhinorrhea, sinus pressure, sneezing, tinnitus, trouble swallowing and voice change.   Eyes: Negative for photophobia, pain, redness and visual disturbance.  Respiratory: Negative for apnea, cough, choking, chest  tightness, shortness of breath and wheezing.   Cardiovascular: Negative for chest pain, palpitations and leg swelling.  Gastrointestinal: Negative for nausea, vomiting, abdominal pain, diarrhea, constipation, blood in stool, abdominal distention, anal bleeding and rectal pain.  Genitourinary: Negative for dysuria, urgency, frequency, hematuria, flank pain, decreased urine volume, discharge, penile swelling, scrotal swelling, difficulty urinating, genital sores and testicular pain.  Musculoskeletal: Positive for back pain. Negative for arthralgias, gait problem, joint swelling, myalgias, neck pain and neck stiffness.  Skin: Negative for color change, rash and wound.  Neurological: Negative for dizziness, tremors, seizures, syncope, facial asymmetry, speech difficulty, weakness, light-headedness, numbness and headaches.  Hematological: Negative for adenopathy. Does not bruise/bleed easily.  Psychiatric/Behavioral: Negative for suicidal ideas, hallucinations, behavioral problems, confusion, sleep disturbance, self-injury, dysphoric mood, decreased concentration and agitation. The patient is not nervous/anxious.        Objective:   Physical Exam  Constitutional: He appears well-developed and well-nourished.  HENT:  Head: Normocephalic and atraumatic.  Right Ear: External ear normal.  Left Ear: External ear normal.  Nose: Nose normal.  Mouth/Throat: Oropharynx is clear and moist.  Eyes: Conjunctivae and EOM are normal. Pupils are equal, round, and reactive to light. No scleral icterus.  Neck: Neck supple. No JVD present. No thyromegaly present.  Moderate decreased range of motion of cervical and lumbar spine  Cardiovascular: Normal rate, regular rhythm and intact distal pulses.  Exam reveals no gallop and no friction rub.   Murmur heard. Grade 2/6 systolic murmur loudest at the base  Pulmonary/Chest: Effort normal and breath sounds normal. He exhibits no tenderness.  Abdominal: Soft. Bowel  sounds are normal. He exhibits no distension and no mass. There is no tenderness.  Mild ventral hernia  Genitourinary: Prostate normal and penis normal. Guaiac negative stool.  Musculoskeletal: Normal range of motion. He exhibits no edema and no tenderness.  Lymphadenopathy:    He has no cervical adenopathy.  Neurological: He is alert. He has normal reflexes. No cranial nerve deficit. Coordination normal.  Skin: Skin is warm and dry. No rash noted.  Psychiatric: He has a normal mood and affect. His behavior is normal.          Assessment & Plan:   Preventive health examination Coronary artery disease stable Ongoing tobacco use.  Total smoking cessation encouraged Hypertension controlled Dyslipidemia controlled Mild aortic stenosis History chronic polyps.  Followup colonoscopy in 4 years Ankylosing spondylitis.  Follow-up rheumatology  Follow rheumatology and orthopedics Recheck here one year Total cessation of smoking encouraged All medications refilled  Review of Systems  Gastrointestinal:  Positive for abdominal pain.       Objective:   Physical Exam  Abdominal:  Moderate size ventral hernia          Assessment & Plan:   Preventive health exam Hypertension, well-controlled Coronary artery disease, stable Ankylosing spondylitis Ongoing tobacco use Dyslipidemia stable Mild aortic stenosis.  Remains asymptomatic  .  Follow-up rheumatology  continued.  By annual eye examinations .  Follow-up colonoscopy 2020 .  Total smoking cessation encouraged  [name], MD

## 2015-12-11 NOTE — Patient Instructions (Signed)
Limit your sodium (Salt) intake  Please check your blood pressure on a regular basis.  If it is consistently greater than 150/90, please make an office appointment.    It is important that you exercise regularly, at least 20 minutes 3 to 4 times per week.  If you develop chest pain or shortness of breath seek  medical attention.  Smoking tobacco is very bad for your health. You should stop smoking immediately.  Return in one year for follow-up  Please see your eye doctor yearly to check for  eye damage

## 2015-12-11 NOTE — Progress Notes (Signed)
Pre visit review using our clinic review tool, if applicable. No additional management support is needed unless otherwise documented below in the visit note. 

## 2016-01-12 ENCOUNTER — Other Ambulatory Visit: Payer: Self-pay | Admitting: Internal Medicine

## 2016-01-17 ENCOUNTER — Emergency Department (HOSPITAL_BASED_OUTPATIENT_CLINIC_OR_DEPARTMENT_OTHER)
Admission: EM | Admit: 2016-01-17 | Discharge: 2016-01-17 | Disposition: A | Discharge status: Home / Self Care | Payer: 59 | Attending: Emergency Medicine | Admitting: Emergency Medicine

## 2016-01-17 ENCOUNTER — Encounter (HOSPITAL_BASED_OUTPATIENT_CLINIC_OR_DEPARTMENT_OTHER): Payer: Self-pay

## 2016-01-17 ENCOUNTER — Emergency Department (HOSPITAL_BASED_OUTPATIENT_CLINIC_OR_DEPARTMENT_OTHER): Payer: 59

## 2016-01-17 DIAGNOSIS — F17210 Nicotine dependence, cigarettes, uncomplicated: Secondary | ICD-10-CM | POA: Insufficient documentation

## 2016-01-17 DIAGNOSIS — A09 Infectious gastroenteritis and colitis, unspecified: Secondary | ICD-10-CM | POA: Insufficient documentation

## 2016-01-17 DIAGNOSIS — R109 Unspecified abdominal pain: Secondary | ICD-10-CM | POA: Diagnosis present

## 2016-01-17 DIAGNOSIS — I10 Essential (primary) hypertension: Secondary | ICD-10-CM | POA: Insufficient documentation

## 2016-01-17 DIAGNOSIS — Z79899 Other long term (current) drug therapy: Secondary | ICD-10-CM | POA: Diagnosis not present

## 2016-01-17 DIAGNOSIS — Z7982 Long term (current) use of aspirin: Secondary | ICD-10-CM | POA: Insufficient documentation

## 2016-01-17 LAB — COMPREHENSIVE METABOLIC PANEL
ALT: 21 U/L (ref 17–63)
AST: 22 U/L (ref 15–41)
Albumin: 4.3 g/dL (ref 3.5–5.0)
Alkaline Phosphatase: 95 U/L (ref 38–126)
Anion gap: 9 (ref 5–15)
BUN: 8 mg/dL (ref 6–20)
CO2: 28 mmol/L (ref 22–32)
Calcium: 9.7 mg/dL (ref 8.9–10.3)
Chloride: 95 mmol/L — ABNORMAL LOW (ref 101–111)
Creatinine, Ser: 0.67 mg/dL (ref 0.61–1.24)
GFR calc Af Amer: >60 mL/min (ref >60)
GFR calc non Af Amer: >60 mL/min (ref >60)
Glucose, Bld: 94 mg/dL (ref 65–99)
Potassium: 3.3 mmol/L — ABNORMAL LOW (ref 3.5–5.1)
Sodium: 132 mmol/L — ABNORMAL LOW (ref 135–145)
Total Bilirubin: 1.2 mg/dL (ref 0.3–1.2)
Total Protein: 7.3 g/dL (ref 6.5–8.1)

## 2016-01-17 MED ORDER — CIPROFLOXACIN HCL 500 MG PO TABS: 500.0000 mg | ORAL_TABLET | Freq: Two times a day (BID) | ORAL | 0 refills | Status: DC

## 2016-01-17 MED ORDER — METRONIDAZOLE 500 MG PO TABS: 500.0000 mg | ORAL_TABLET | Freq: Three times a day (TID) | ORAL | 0 refills | Status: DC

## 2016-01-17 MED ORDER — HYDROCODONE-ACETAMINOPHEN 5-325 MG PO TABS
2.0000 | ORAL_TABLET | ORAL | 0 refills | Status: DC | PRN
Start: 2016-01-17 — End: 2016-12-13

## 2016-01-17 MED ORDER — METRONIDAZOLE 500 MG PO TABS
500.0000 mg | ORAL_TABLET | Freq: Once | ORAL | Status: AC
  Administered 2016-01-17: 500 mg via ORAL
  Filled 2016-01-17: qty 1

## 2016-01-17 MED ORDER — CIPROFLOXACIN HCL 500 MG PO TABS
500.0000 mg | ORAL_TABLET | Freq: Once | ORAL | Status: AC
  Administered 2016-01-17: 500 mg via ORAL
  Filled 2016-01-17: qty 1

## 2016-01-17 MED ORDER — IOPAMIDOL (ISOVUE-300) INJECTION 61%
100.0000 mL | Freq: Once | INTRAVENOUS | Status: AC | PRN
  Administered 2016-01-17: 100 mL via INTRAVENOUS

## 2016-01-17 NOTE — ED Provider Notes (Signed)
Pt seen and evaluated.  D/W Dr. Emmaline Life.  Pt with abdominal pain since this morning localized to his right lower quadrant and leukocytosis. Highly suspicious for appendicitis. CT scan shows a focal colitis and normal appendix. Discussed with GI. Patient has history of ankylosing spondylitis. His abnormal colonoscopy in the past. Plan will be treatment as recommended by GI for infectious colitis. Discussed with patient. Has to be attentive so symptoms and aware of his immune suppression. Recheck with any they have improve or worsening symptoms. Plan Cipro, Flagyl, pain control, low fiber low residue diet.   Tanna Furry, MD 01/17/16 [phone]

## 2016-01-17 NOTE — ED Notes (Signed)
Patient returned from CT with tech via stretcher

## 2016-01-17 NOTE — ED Notes (Signed)
Patient transported to CT 

## 2016-01-17 NOTE — Discharge Instructions (Signed)
Please take ciprofloxacin and Flagyl for 10 days as directed. You will need to follow up with [facility] to determine if you need further work up. You should lower fiber diet while having symptoms.

## 2016-01-17 NOTE — ED Triage Notes (Signed)
C/o right side abd pain, n/d x today-sent from urgent care-NAD-steady gait

## 2016-01-17 NOTE — ED Provider Notes (Signed)
[place] DEPT [facility] Provider Note   CSN: [id number] Arrival date & time: 01/17/16  1633     History   Chief Complaint Chief Complaint  Patient presents with  . Abdominal Pain    HPI   Brandon [name] is a 60 y.o. male with pmhx CAD, HTN, HLD and ankylosing spondylitis presenting for abdominal pain. He states this morning he woke up with abdominal pain. Patient thought he might be having gas so he took had a coke. He states the pain worsened over the course of the day. Patient was seen at  [facility] urgent care and was sent here for possibly appendicitis. Patient states stools were a bit loose this morning. Endorses nauseated. No fever or chills. No past abdominal surgery hx. Regular bowel movements   At [facility] urgent care had WBC of 15.7, UA negative.       Past Medical History:  Diagnosis Date  . Ankylosing spondylitis ([place])   . Arthritis   . BENIGN PROSTATIC HYPERTROPHY 06/07/2008  . COLONIC POLYPS, HX OF 06/07/2008  . CORONARY ARTERY DISEASE 06/07/2008  . H/O hiatal hernia   . Heart murmur   . HYPERLIPIDEMIA 06/07/2008  . HYPERTENSION 06/07/2008  . Myocardial infarction ([place])   . NEPHROLITHIASIS, HX OF 06/07/2008    Patient Active Problem List   Diagnosis Date Noted  . Infectious colitis 01/17/2016  . Mild aortic stenosis 12/02/2012  . Preop cardiovascular exam 10/31/2011  . Murmur 10/31/2011  . Ankylosis of sacroiliac joint 03/08/2011  . TOBACCO ABUSE 03/13/2009  . HYPERLIPIDEMIA 06/07/2008  . HYPERTENSION 06/07/2008  . Coronary atherosclerosis 06/07/2008  . BENIGN PROSTATIC HYPERTROPHY 06/07/2008  . COLONIC POLYPS, HX OF 06/07/2008  . NEPHROLITHIASIS, HX OF 06/07/2008    Past Surgical History:  Procedure Laterality Date  . CORONARY ANGIOPLASTY WITH STENT PLACEMENT     Stent- approx 2008  . LAMINECTOMY     lumbar  . LUMBAR FUSION  01/2012  . TONSILLECTOMY    . warthin tumor removal Left 2014       Home Medications    Prior to Admission medications    Medication Sig Start Date End Date Taking? Authorizing Provider  aspirin 325 MG EC tablet Take 325 mg by mouth daily.    Historical Provider, MD  atenolol (TENORMIN) 50 MG tablet Take 1 tablet (50 mg total) by mouth daily. 06/21/15   Marletta Lor, MD  atorvastatin (LIPITOR) 80 MG tablet Take 1 tablet by mouth once daily at 6pm. 08/17/15   Marletta Lor, MD  ciprofloxacin (CIPRO) 500 MG tablet Take 1 tablet (500 mg total) by mouth 2 (two) times daily. 01/17/16   Dietrich Ke Cletis Media, MD  ciprofloxacin (CIPRO) 500 MG tablet Take 1 tablet (500 mg total) by mouth 2 (two) times daily. 01/17/16   Tanna Furry, MD  etanercept (ENBREL) 50 MG/ML injection Inject 50 mg into the skin once a week. Sunday     Historical Provider, MD  HYDROcodone-acetaminophen (NORCO/VICODIN) 5-325 MG tablet Take 2 tablets by mouth every 4 (four) hours as needed. 01/17/16   Tanna Furry, MD  ibuprofen (ADVIL,MOTRIN) 800 MG tablet Take 1 tablet by mouth  every 8 hours as needed 01/13/16   Marletta Lor, MD  metroNIDAZOLE (FLAGYL) 500 MG tablet Take 1 tablet (500 mg total) by mouth 3 (three) times daily. 01/17/16   Erika Slaby Cletis Media, MD  metroNIDAZOLE (FLAGYL) 500 MG tablet Take 1 tablet (500 mg total) by mouth 3 (three) times daily. 01/17/16   Tanna Furry,  MD  sildenafil (VIAGRA) 50 MG tablet take 1 tablet by mouth once daily if needed 12/03/13   Marletta Lor, MD  tamsulosin [facility]) 0.4 MG CAPS capsule Take 1 capsule (0.4 mg total) by mouth daily. 06/21/15   Marletta Lor, MD  telmisartan-hydrochlorothiazide (MICARDIS HCT) 80-12.5 MG tablet Take 1 tablet by mouth  daily 08/17/15   Marletta Lor, MD    Family History Family History  Problem Relation Age of Onset  . Depression Father   . Arthritis Sister     RA  . Heart failure Mother   . Other Neg Hx   . Colon cancer Neg Hx   . Esophageal cancer Neg Hx   . Rectal cancer Neg Hx   . Stomach cancer Neg Hx     Social History Social History  Substance  Use Topics  . Smoking status: Current Every Day Smoker    Packs/day: 0.50    Years: 40.00    Types: Cigarettes  . Smokeless tobacco: Never Used  . Alcohol use Yes     Comment: daily     Allergies   Review of patient's allergies indicates no known allergies.   Review of Systems Review of Systems  Constitutional: Negative for chills and fever.  Gastrointestinal: Positive for abdominal pain and nausea. Negative for constipation, diarrhea and vomiting.  Genitourinary: Negative for dysuria and frequency.     Physical Exam Updated Vital Signs BP 143/98 (BP Location: Left Arm)   Pulse 106   Temp 98.5 F (36.9 C) (Oral)   Resp 18   Ht 6' (1.829 m)   Wt 88.5 kg   SpO2 95%   BMI 26.45 kg/m   Physical Exam  Constitutional: He is oriented to person, place, and time. He appears well-developed and well-nourished.  HENT:  Head: Normocephalic and atraumatic.  Eyes: Conjunctivae and EOM are normal.  Neck: Normal range of motion. Neck supple.  Cardiovascular: Normal rate, regular rhythm, normal heart sounds and intact distal pulses.   Pulmonary/Chest: Effort normal and breath sounds normal.  Abdominal: Soft. Bowel sounds are normal. There is tenderness in the right lower quadrant. There is tenderness at McBurney's point.    Positive rovsing and obturator sign   Musculoskeletal: Normal range of motion.  Neurological: He is alert and oriented to person, place, and time.  Skin: Skin is warm and dry.  Psychiatric: He has a normal mood and affect.    ED Treatments / Results  Labs (all labs ordered are listed, but only abnormal results are displayed) Labs Reviewed  COMPREHENSIVE METABOLIC PANEL - Abnormal; Notable for the following:       Result Value   Sodium 132 (*)    Potassium 3.3 (*)    Chloride 95 (*)    All other components within normal limits    EKG  EKG Interpretation None       Radiology Ct Abdomen Pelvis W Contrast  Result Date: 01/17/2016 CLINICAL DATA:   Right lower quadrant pain with nausea and vomiting EXAM: CT ABDOMEN AND PELVIS WITH CONTRAST TECHNIQUE: Multidetector CT imaging of the abdomen and pelvis was performed using the standard protocol following bolus administration of intravenous contrast. CONTRAST:  132m ISOVUE-300 IOPAMIDOL (ISOVUE-300) INJECTION 61% COMPARISON:  None. FINDINGS: Lower chest: Lungs are well-aerated without focal infiltrate. Coronary calcifications are seen. Hepatobiliary: Some dependent density is noted in the gallbladder. This may represent multiple small gallstones or hyperdense gallbladder sludge. The liver is within normal limits. Pancreas: No mass, inflammatory changes, or  other significant abnormality. Spleen: Within normal limits in size and appearance. Adrenals/Urinary Tract: The adrenal glands are within normal limits bilaterally. Nonobstructing 3 mm stone is noted in the upper pole of the left kidney. A few small nonobstructing stones are noted on the right. The collecting systems are otherwise within normal limits. No obstructive changes or ureteral calculi are noted. The bladder is well distended. Stomach/Bowel: The appendix is within normal limits. Some mild wall edema is noted in the cecum with very minimal pericolonic inflammatory change which may represent some very focal colitis. No perforation or abscess is identified. Vascular/Lymphatic: Diffuse aortoiliac calcifications are seen without aneurysmal dilatation. No significant lymphadenopathy is noted. Reproductive: No mass or other significant abnormality. Other: None. Musculoskeletal: Postsurgical changes are noted in the lower lumbar spine. No acute bony abnormality is seen. IMPRESSION: Dependent density within the gallbladder likely representing hyperdense sludge or multiple small gallstones. No complicating factors are noted. Bilateral nonobstructing renal calculi. Mild edema in the cecum with pericolonic inflammatory change consistent with focal colitis.  Electronically Signed   By: Inez Catalina M.D.   On: 01/17/2016 18:28    Procedures Procedures (including critical care time)  Medications Ordered in ED Medications  iopamidol (ISOVUE-300) 61 % injection 100 mL (100 mLs Intravenous Contrast Given 01/17/16 1808)  ciprofloxacin (CIPRO) tablet 500 mg (500 mg Oral Given 01/17/16 1951)  metroNIDAZOLE (FLAGYL) tablet 500 mg (500 mg Oral Given 01/17/16 1951)     Initial Impression / Assessment and Plan / ED Course  I have reviewed the triage vital signs and the nursing notes.  Pertinent labs & imaging results that were available during my care of the patient were reviewed by me and considered in my medical decision making (see chart for details).  Clinical Course   Patient with leukocytosis and physical exam consistent with appendicitis. CT was positive for possibly focal colitis. GI  (Dr. Fuller Plan) consulted and indicated that due to sudden onset of symptoms of colitis, would treat as infectious with Cipro and flagyl rather than an inflammatory process despite patient hx of ankylosising spondlyitis. Therefore provided 10 days of antibiotics with recommendation to follow up with [facility].   Final Clinical Impressions(s) / ED Diagnoses   Final diagnoses:  Infectious colitis    New Prescriptions Discharge Medication List as of 01/17/2016  7:41 PM    START taking these medications   Details  ciprofloxacin (CIPRO) 500 MG tablet Take 1 tablet (500 mg total) by mouth 2 (two) times daily., Starting Wed 01/17/2016, Normal    metroNIDAZOLE (FLAGYL) 500 MG tablet Take 1 tablet (500 mg total) by mouth 3 (three) times daily., Starting Wed 01/17/2016, Normal         Maeve Debord Cletis Media, MD 01/17/16 2006    Tanna Furry, MD 01/27/16 [phone]

## 2016-01-17 NOTE — ED Notes (Signed)
CT has dosed patient with oral contrast at this time.

## 2016-02-04 ENCOUNTER — Other Ambulatory Visit: Payer: Self-pay | Admitting: Internal Medicine

## 2016-03-16 ENCOUNTER — Other Ambulatory Visit: Payer: Self-pay | Admitting: Internal Medicine

## 2016-06-18 ENCOUNTER — Other Ambulatory Visit: Payer: Self-pay | Admitting: Internal Medicine

## 2016-07-18 DIAGNOSIS — H35712 Central serous chorioretinopathy, left eye: Secondary | ICD-10-CM | POA: Diagnosis not present

## 2016-07-18 DIAGNOSIS — H353110 Nonexudative age-related macular degeneration, right eye, stage unspecified: Secondary | ICD-10-CM | POA: Diagnosis not present

## 2016-07-18 DIAGNOSIS — H40013 Open angle with borderline findings, low risk, bilateral: Secondary | ICD-10-CM | POA: Diagnosis not present

## 2016-07-18 LAB — HM DIABETES EYE EXAM

## 2016-07-19 ENCOUNTER — Encounter: Payer: Self-pay | Admitting: Internal Medicine

## 2016-08-01 ENCOUNTER — Encounter (INDEPENDENT_AMBULATORY_CARE_PROVIDER_SITE_OTHER): Payer: 59 | Admitting: Ophthalmology

## 2016-08-01 DIAGNOSIS — H353111 Nonexudative age-related macular degeneration, right eye, early dry stage: Secondary | ICD-10-CM | POA: Diagnosis not present

## 2016-08-01 DIAGNOSIS — H353221 Exudative age-related macular degeneration, left eye, with active choroidal neovascularization: Secondary | ICD-10-CM | POA: Diagnosis not present

## 2016-08-01 DIAGNOSIS — I10 Essential (primary) hypertension: Secondary | ICD-10-CM | POA: Diagnosis not present

## 2016-08-01 DIAGNOSIS — H2513 Age-related nuclear cataract, bilateral: Secondary | ICD-10-CM | POA: Diagnosis not present

## 2016-08-01 DIAGNOSIS — H35033 Hypertensive retinopathy, bilateral: Secondary | ICD-10-CM

## 2016-08-01 DIAGNOSIS — H43813 Vitreous degeneration, bilateral: Secondary | ICD-10-CM

## 2016-08-06 DIAGNOSIS — H2513 Age-related nuclear cataract, bilateral: Secondary | ICD-10-CM | POA: Diagnosis not present

## 2016-08-06 DIAGNOSIS — H25041 Posterior subcapsular polar age-related cataract, right eye: Secondary | ICD-10-CM | POA: Diagnosis not present

## 2016-08-06 DIAGNOSIS — H25013 Cortical age-related cataract, bilateral: Secondary | ICD-10-CM | POA: Diagnosis not present

## 2016-08-06 LAB — HM DIABETES EYE EXAM

## 2016-08-13 DIAGNOSIS — Z23 Encounter for immunization: Secondary | ICD-10-CM | POA: Diagnosis not present

## 2016-08-14 DIAGNOSIS — M459 Ankylosing spondylitis of unspecified sites in spine: Secondary | ICD-10-CM | POA: Diagnosis not present

## 2016-08-14 DIAGNOSIS — H209 Unspecified iridocyclitis: Secondary | ICD-10-CM | POA: Diagnosis not present

## 2016-08-28 DIAGNOSIS — H25811 Combined forms of age-related cataract, right eye: Secondary | ICD-10-CM | POA: Diagnosis not present

## 2016-08-28 DIAGNOSIS — H2181 Floppy iris syndrome: Secondary | ICD-10-CM | POA: Diagnosis not present

## 2016-08-28 DIAGNOSIS — H2511 Age-related nuclear cataract, right eye: Secondary | ICD-10-CM | POA: Diagnosis not present

## 2016-08-30 ENCOUNTER — Encounter (INDEPENDENT_AMBULATORY_CARE_PROVIDER_SITE_OTHER): Payer: 59 | Admitting: Ophthalmology

## 2016-08-30 DIAGNOSIS — I10 Essential (primary) hypertension: Secondary | ICD-10-CM | POA: Diagnosis not present

## 2016-08-30 DIAGNOSIS — H35372 Puckering of macula, left eye: Secondary | ICD-10-CM | POA: Diagnosis not present

## 2016-08-30 DIAGNOSIS — H353111 Nonexudative age-related macular degeneration, right eye, early dry stage: Secondary | ICD-10-CM | POA: Diagnosis not present

## 2016-08-30 DIAGNOSIS — H2512 Age-related nuclear cataract, left eye: Secondary | ICD-10-CM

## 2016-08-30 DIAGNOSIS — H18221 Idiopathic corneal edema, right eye: Secondary | ICD-10-CM

## 2016-08-30 DIAGNOSIS — H43813 Vitreous degeneration, bilateral: Secondary | ICD-10-CM

## 2016-08-30 DIAGNOSIS — H35033 Hypertensive retinopathy, bilateral: Secondary | ICD-10-CM | POA: Diagnosis not present

## 2016-09-09 ENCOUNTER — Other Ambulatory Visit: Payer: Self-pay | Admitting: Internal Medicine

## 2016-09-30 ENCOUNTER — Encounter (INDEPENDENT_AMBULATORY_CARE_PROVIDER_SITE_OTHER): Payer: 59 | Admitting: Ophthalmology

## 2016-09-30 DIAGNOSIS — H353111 Nonexudative age-related macular degeneration, right eye, early dry stage: Secondary | ICD-10-CM | POA: Diagnosis not present

## 2016-09-30 DIAGNOSIS — H35712 Central serous chorioretinopathy, left eye: Secondary | ICD-10-CM | POA: Diagnosis not present

## 2016-09-30 DIAGNOSIS — H353122 Nonexudative age-related macular degeneration, left eye, intermediate dry stage: Secondary | ICD-10-CM | POA: Diagnosis not present

## 2016-09-30 DIAGNOSIS — H2513 Age-related nuclear cataract, bilateral: Secondary | ICD-10-CM

## 2016-09-30 DIAGNOSIS — I10 Essential (primary) hypertension: Secondary | ICD-10-CM

## 2016-09-30 DIAGNOSIS — H43813 Vitreous degeneration, bilateral: Secondary | ICD-10-CM

## 2016-09-30 DIAGNOSIS — H35033 Hypertensive retinopathy, bilateral: Secondary | ICD-10-CM

## 2016-10-23 ENCOUNTER — Other Ambulatory Visit: Payer: Self-pay | Admitting: Internal Medicine

## 2016-10-26 DIAGNOSIS — Z23 Encounter for immunization: Secondary | ICD-10-CM | POA: Diagnosis not present

## 2016-11-11 ENCOUNTER — Encounter (INDEPENDENT_AMBULATORY_CARE_PROVIDER_SITE_OTHER): Payer: 59 | Admitting: Ophthalmology

## 2016-11-11 DIAGNOSIS — I10 Essential (primary) hypertension: Secondary | ICD-10-CM | POA: Diagnosis not present

## 2016-11-11 DIAGNOSIS — H353131 Nonexudative age-related macular degeneration, bilateral, early dry stage: Secondary | ICD-10-CM | POA: Diagnosis not present

## 2016-11-11 DIAGNOSIS — H35712 Central serous chorioretinopathy, left eye: Secondary | ICD-10-CM | POA: Diagnosis not present

## 2016-11-11 DIAGNOSIS — H35033 Hypertensive retinopathy, bilateral: Secondary | ICD-10-CM

## 2016-11-11 DIAGNOSIS — H2512 Age-related nuclear cataract, left eye: Secondary | ICD-10-CM

## 2016-11-11 DIAGNOSIS — H43813 Vitreous degeneration, bilateral: Secondary | ICD-10-CM | POA: Diagnosis not present

## 2016-12-06 ENCOUNTER — Other Ambulatory Visit (INDEPENDENT_AMBULATORY_CARE_PROVIDER_SITE_OTHER): Payer: 59

## 2016-12-06 DIAGNOSIS — Z125 Encounter for screening for malignant neoplasm of prostate: Secondary | ICD-10-CM | POA: Diagnosis not present

## 2016-12-06 DIAGNOSIS — E785 Hyperlipidemia, unspecified: Secondary | ICD-10-CM | POA: Diagnosis not present

## 2016-12-06 DIAGNOSIS — Z0000 Encounter for general adult medical examination without abnormal findings: Secondary | ICD-10-CM | POA: Diagnosis not present

## 2016-12-06 DIAGNOSIS — I10 Essential (primary) hypertension: Secondary | ICD-10-CM | POA: Diagnosis not present

## 2016-12-06 LAB — CBC WITH DIFFERENTIAL/PLATELET
Basophils Absolute: 0.1 10*3/uL (ref 0.0–0.1)
Basophils Relative: 0.6 % (ref 0.0–3.0)
Eosinophils Absolute: 0.3 10*3/uL (ref 0.0–0.7)
Eosinophils Relative: 2.7 % (ref 0.0–5.0)
HCT: 47.7 % (ref 39.0–52.0)
Hemoglobin: 16.1 g/dL (ref 13.0–17.0)
Lymphocytes Relative: 13.3 % (ref 12.0–46.0)
Lymphs Abs: 1.4 10*3/uL (ref 0.7–4.0)
MCHC: 33.8 g/dL (ref 30.0–36.0)
MCV: 93.9 fl (ref 78.0–100.0)
Monocytes Absolute: 0.9 10*3/uL (ref 0.1–1.0)
Monocytes Relative: 8.3 % (ref 3.0–12.0)
Neutro Abs: 8 10*3/uL — ABNORMAL HIGH (ref 1.4–7.7)
Neutrophils Relative %: 75.1 % (ref 43.0–77.0)
Platelets: 236 10*3/uL (ref 150.0–400.0)
RBC: 5.08 Mil/uL (ref 4.22–5.81)
RDW: 13.6 % (ref 11.5–15.5)
WBC: 10.6 10*3/uL — ABNORMAL HIGH (ref 4.0–10.5)

## 2016-12-06 LAB — POC URINALSYSI DIPSTICK (AUTOMATED)
Bilirubin, UA: NEGATIVE
Blood, UA: NEGATIVE
Glucose, UA: NEGATIVE
Ketones, UA: NEGATIVE
Leukocytes, UA: NEGATIVE
Nitrite, UA: NEGATIVE
Protein, UA: NEGATIVE
Spec Grav, UA: 1.01 (ref 1.010–1.025)
Urobilinogen, UA: 0.2 E.U./dL
pH, UA: 6.5 (ref 5.0–8.0)

## 2016-12-06 LAB — LIPID PANEL
Cholesterol: 138 mg/dL (ref 0–200)
HDL: 50.7 mg/dL (ref >39.00)
LDL Cholesterol: 74 mg/dL (ref 0–99)
NonHDL: 87.67
Total CHOL/HDL Ratio: 3
Triglycerides: 67 mg/dL (ref 0.0–149.0)
VLDL: 13.4 mg/dL (ref 0.0–40.0)

## 2016-12-06 LAB — BASIC METABOLIC PANEL
BUN: 10 mg/dL (ref 6–23)
CO2: 29 mEq/L (ref 19–32)
Calcium: 9.6 mg/dL (ref 8.4–10.5)
Chloride: 94 mEq/L — ABNORMAL LOW (ref 96–112)
Creatinine, Ser: 0.79 mg/dL (ref 0.40–1.50)
GFR: 105.21 mL/min (ref >60.00)
Glucose, Bld: 97 mg/dL (ref 70–99)
Potassium: 4.8 mEq/L (ref 3.5–5.1)
Sodium: 129 mEq/L — ABNORMAL LOW (ref 135–145)

## 2016-12-06 LAB — HEPATIC FUNCTION PANEL
ALT: 22 U/L (ref 0–53)
AST: 19 U/L (ref 0–37)
Albumin: 4.4 g/dL (ref 3.5–5.2)
Alkaline Phosphatase: 85 U/L (ref 39–117)
Bilirubin, Direct: 0.2 mg/dL (ref 0.0–0.3)
Total Bilirubin: 0.9 mg/dL (ref 0.2–1.2)
Total Protein: 7 g/dL (ref 6.0–8.3)

## 2016-12-06 LAB — TSH: TSH: 0.76 u[IU]/mL (ref 0.35–4.50)

## 2016-12-06 LAB — PSA: PSA: 0.92 ng/mL (ref 0.10–4.00)

## 2016-12-13 ENCOUNTER — Encounter: Payer: Self-pay | Admitting: Internal Medicine

## 2016-12-13 ENCOUNTER — Ambulatory Visit (INDEPENDENT_AMBULATORY_CARE_PROVIDER_SITE_OTHER): Payer: 59 | Admitting: Internal Medicine

## 2016-12-13 VITALS — BP 128/74 | HR 90 | Temp 98.2°F | Ht 70.0 in | Wt 192.2 lb

## 2016-12-13 DIAGNOSIS — I10 Essential (primary) hypertension: Secondary | ICD-10-CM | POA: Diagnosis not present

## 2016-12-13 DIAGNOSIS — Z0000 Encounter for general adult medical examination without abnormal findings: Secondary | ICD-10-CM | POA: Diagnosis not present

## 2016-12-13 DIAGNOSIS — I2510 Atherosclerotic heart disease of native coronary artery without angina pectoris: Secondary | ICD-10-CM | POA: Diagnosis not present

## 2016-12-13 DIAGNOSIS — M459 Ankylosing spondylitis of unspecified sites in spine: Secondary | ICD-10-CM | POA: Diagnosis not present

## 2016-12-13 DIAGNOSIS — Z720 Tobacco use: Secondary | ICD-10-CM | POA: Diagnosis not present

## 2016-12-13 DIAGNOSIS — E785 Hyperlipidemia, unspecified: Secondary | ICD-10-CM

## 2016-12-13 MED ORDER — VARENICLINE TARTRATE 0.5 MG X 11 & 1 MG X 42 PO MISC: ORAL | 0 refills | Status: DC

## 2016-12-13 MED ORDER — TELMISARTAN 80 MG PO TABS: 80.0000 mg | ORAL_TABLET | Freq: Every day | ORAL | 6 refills | Status: DC

## 2016-12-13 MED ORDER — VARENICLINE TARTRATE 1 MG PO TABS: 1.0000 mg | ORAL_TABLET | Freq: Two times a day (BID) | ORAL | 2 refills | Status: DC

## 2016-12-13 NOTE — Progress Notes (Signed)
Subjective:    Patient ID: Brandon [name], male    DOB: 1956/02/03, 61 y.o.   MRN: [id number]  HPI  61 year old patient who is seen today for his annual exam .  He has a history of coronary artery disease status post stenting in the past.  He denies any cardiopulmonary complaints. He did have a colonoscopy 2015. He has a history mild aortic stenosis He is followed by rheumatology with ankylosing spondylitis.  He has been on Enbrel for a number of years.  His rheumatologist has recently retired. He has a history of ongoing tobacco use. He has essential hypertension and dyslipidemia.    He was treated in the fall for Infectious colitis.  Abdominal CT scan was performed at that time.  He has had cataract extraction surgery for the right eye.  Has seen Dr. Zigmund Daniel for some retinal issues on the left Anticipating left cataract extraction surgery  Past Medical History:  Diagnosis Date  . Ankylosing spondylitis ([place])   . Arthritis   . BENIGN PROSTATIC HYPERTROPHY 06/07/2008  . COLONIC POLYPS, HX OF 06/07/2008  . CORONARY ARTERY DISEASE 06/07/2008  . H/O hiatal hernia   . Heart murmur   . HYPERLIPIDEMIA 06/07/2008  . HYPERTENSION 06/07/2008  . Myocardial infarction ([place])   . NEPHROLITHIASIS, HX OF 06/07/2008     Social History   Social History  . Marital status: Single    Spouse name: N/A  . Number of children: N/A  . Years of education: N/A   Occupational History  . Manager [employer]   Social History Main Topics  . Smoking status: Current Every Day Smoker    Packs/day: 0.50    Years: 40.00    Types: Cigarettes  . Smokeless tobacco: Never Used  . Alcohol use Yes     Comment: daily  . Drug use: No  . Sexual activity: Not on file   Other Topics Concern  . Not on file   Social History Narrative  . No narrative on file    Past Surgical History:  Procedure Laterality Date  . CORONARY ANGIOPLASTY WITH STENT PLACEMENT     Stent- approx 2008  . LAMINECTOMY     lumbar  . LUMBAR FUSION  01/2012  . TONSILLECTOMY    . warthin tumor removal Left 2014    Family History  Problem Relation Age of Onset  . Depression Father   . Arthritis Sister        RA  . Heart failure Mother   . Other Neg Hx   . Colon cancer Neg Hx   . Esophageal cancer Neg Hx   . Rectal cancer Neg Hx   . Stomach cancer Neg Hx     No Known Allergies  Current Outpatient Prescriptions on File Prior to Visit  Medication Sig Dispense Refill  . aspirin 325 MG EC tablet Take 325 mg by mouth daily.    [name] atenolol (TENORMIN) 50 MG tablet TAKE 1 TABLET BY MOUTH  DAILY 90 tablet 1  . atorvastatin (LIPITOR) 80 MG tablet TAKE 1 TABLET BY MOUTH ONCE DAILY AT 6PM. 90 tablet 3  . etanercept (ENBREL) 50 MG/ML injection Inject 50 mg into the skin once a week. Sunday     . ibuprofen (ADVIL,MOTRIN) 800 MG tablet TAKE 1 TABLET BY MOUTH  EVERY 8 HOURS AS NEEDED 270 tablet 1  . sildenafil (VIAGRA) 50 MG tablet take 1 tablet by mouth once daily if needed 10 tablet 6  . tamsulosin (FLOMAX) 0.4  MG CAPS capsule TAKE 1 CAPSULE BY MOUTH  DAILY 90 capsule 1  . telmisartan-hydrochlorothiazide (MICARDIS HCT) 80-12.5 MG tablet TAKE 1 TABLET BY MOUTH  DAILY 90 tablet 3  . VIAGRA 50 MG tablet TAKE 1 TABLET BY MOUTH ONCE DAILY IF NEEDED 9 tablet 3   No current facility-administered medications on file prior to visit.     BP 128/74 (BP Location: Left Arm, Patient Position: Sitting, Cuff Size: Normal)   Pulse 90   Temp 98.2 F (36.8 C) (Oral)   Ht 5\' 10"  (1.778 m)   Wt 192 lb 3.2 oz (87.2 kg)   SpO2 96%   BMI 27.58 kg/m    Review of Systems  Constitutional: Negative for appetite change, chills, fatigue and fever.  HENT: Negative for congestion, dental problem, ear pain, hearing loss, sore throat, tinnitus, trouble swallowing and voice change.   Eyes: Negative for pain, discharge and visual disturbance.  Respiratory: Negative for cough, chest tightness, wheezing and stridor.   Cardiovascular:  Negative for chest pain, palpitations and leg swelling.  Gastrointestinal: Negative for abdominal distention, abdominal pain, blood in stool, constipation, diarrhea, nausea and vomiting.  Genitourinary: Negative for difficulty urinating, discharge, flank pain, genital sores, hematuria and urgency.  Musculoskeletal: Positive for back pain and neck stiffness. Negative for arthralgias, gait problem, joint swelling and myalgias.  Skin: Negative for rash.  Neurological: Negative for dizziness, syncope, speech difficulty, weakness, numbness and headaches.  Hematological: Negative for adenopathy. Does not bruise/bleed easily.  Psychiatric/Behavioral: Negative for behavioral problems and dysphoric mood. The patient is not nervous/anxious.        Objective:   Physical Exam  Constitutional: He is oriented to person, place, and time. He appears well-developed.  HENT:  Head: Normocephalic.  Right Ear: External ear normal.  Left Ear: External ear normal.  Eyes: Conjunctivae and EOM are normal.  Neck: Normal range of motion.  Cardiovascular: Normal rate and intact distal pulses.   Murmur heard. Grade 3/6 systolic murmur loudest at the base  Pulmonary/Chest: Breath sounds normal.  Abdominal: Bowel sounds are normal.  Musculoskeletal: Normal range of motion. He exhibits no edema or tenderness.  Decrease range of motion.  Cervical and lumbar spine  Neurological: He is alert and oriented to person, place, and time.  Psychiatric: He has a normal mood and affect. His behavior is normal.          Assessment & Plan:   Preventive health examination Coronary artery disease.  Continue efforts at aggressive risk factor modification and smoking cessation Ongoing tobacco use.  Trial Chantix Essential hypertension, well-controlled Dyslipidemia.  Continue high intensity statin therapy Ankylosing spondylitis.  Follow-up rheumatology History of hyponatremia.  Blood pressure low normal range.  We'll  discontinue hydrochlorothiazide  Continue home blood pressure monitoring Follow-up here one year  Brandon [name]

## 2016-12-13 NOTE — Patient Instructions (Signed)
Limit your sodium (Salt) intake  Smoking tobacco is very bad for your health. You should stop smoking immediately.  Follow-up rheumatology  Please check your blood pressure on a regular basis.  If it is consistently greater than 150/90, please make an office appointment.    It is important that you exercise regularly, at least 20 minutes 3 to 4 times per week.  If you develop chest pain or shortness of breath seek  medical attention.  Return in one year for follow-up

## 2016-12-23 ENCOUNTER — Encounter (INDEPENDENT_AMBULATORY_CARE_PROVIDER_SITE_OTHER): Payer: 59 | Admitting: Ophthalmology

## 2016-12-23 ENCOUNTER — Telehealth: Payer: Self-pay

## 2016-12-23 DIAGNOSIS — H35712 Central serous chorioretinopathy, left eye: Secondary | ICD-10-CM | POA: Diagnosis not present

## 2016-12-23 DIAGNOSIS — H353122 Nonexudative age-related macular degeneration, left eye, intermediate dry stage: Secondary | ICD-10-CM | POA: Diagnosis not present

## 2016-12-23 DIAGNOSIS — H353111 Nonexudative age-related macular degeneration, right eye, early dry stage: Secondary | ICD-10-CM | POA: Diagnosis not present

## 2016-12-23 DIAGNOSIS — H35033 Hypertensive retinopathy, bilateral: Secondary | ICD-10-CM | POA: Diagnosis not present

## 2016-12-23 DIAGNOSIS — H2512 Age-related nuclear cataract, left eye: Secondary | ICD-10-CM

## 2016-12-23 DIAGNOSIS — I10 Essential (primary) hypertension: Secondary | ICD-10-CM | POA: Diagnosis not present

## 2016-12-23 DIAGNOSIS — H43813 Vitreous degeneration, bilateral: Secondary | ICD-10-CM

## 2016-12-23 NOTE — Telephone Encounter (Signed)
Received PA request for Chantix. PA submitted & pending. Key: [id number]

## 2016-12-24 NOTE — Telephone Encounter (Signed)
PA for Chantix Starting month approved, PA submitted for the Chantix 1 mg tablet. Key: [id number]

## 2016-12-27 DIAGNOSIS — Z961 Presence of intraocular lens: Secondary | ICD-10-CM | POA: Diagnosis not present

## 2016-12-27 DIAGNOSIS — H25012 Cortical age-related cataract, left eye: Secondary | ICD-10-CM | POA: Diagnosis not present

## 2016-12-27 DIAGNOSIS — H2512 Age-related nuclear cataract, left eye: Secondary | ICD-10-CM | POA: Diagnosis not present

## 2017-01-01 ENCOUNTER — Other Ambulatory Visit: Payer: Self-pay | Admitting: Internal Medicine

## 2017-01-01 MED ORDER — TELMISARTAN 80 MG PO TABS: 80.0000 mg | ORAL_TABLET | Freq: Every day | ORAL | 3 refills | Status: DC

## 2017-01-07 ENCOUNTER — Other Ambulatory Visit: Payer: Self-pay | Admitting: Internal Medicine

## 2017-01-26 ENCOUNTER — Other Ambulatory Visit: Payer: Self-pay | Admitting: Internal Medicine

## 2017-01-29 DIAGNOSIS — H2181 Floppy iris syndrome: Secondary | ICD-10-CM | POA: Diagnosis not present

## 2017-01-29 DIAGNOSIS — H2512 Age-related nuclear cataract, left eye: Secondary | ICD-10-CM | POA: Diagnosis not present

## 2017-01-29 DIAGNOSIS — H25812 Combined forms of age-related cataract, left eye: Secondary | ICD-10-CM | POA: Diagnosis not present

## 2017-02-03 ENCOUNTER — Encounter (INDEPENDENT_AMBULATORY_CARE_PROVIDER_SITE_OTHER): Payer: 59 | Admitting: Ophthalmology

## 2017-02-03 DIAGNOSIS — H353132 Nonexudative age-related macular degeneration, bilateral, intermediate dry stage: Secondary | ICD-10-CM | POA: Diagnosis not present

## 2017-02-03 DIAGNOSIS — I10 Essential (primary) hypertension: Secondary | ICD-10-CM | POA: Diagnosis not present

## 2017-02-03 DIAGNOSIS — H35712 Central serous chorioretinopathy, left eye: Secondary | ICD-10-CM | POA: Diagnosis not present

## 2017-02-03 DIAGNOSIS — H35033 Hypertensive retinopathy, bilateral: Secondary | ICD-10-CM | POA: Diagnosis not present

## 2017-02-03 DIAGNOSIS — H43813 Vitreous degeneration, bilateral: Secondary | ICD-10-CM | POA: Diagnosis not present

## 2017-02-14 ENCOUNTER — Encounter: Payer: Self-pay | Admitting: Gastroenterology

## 2017-02-19 ENCOUNTER — Telehealth: Payer: Self-pay | Admitting: Internal Medicine

## 2017-02-19 MED ORDER — TELMISARTAN 80 MG PO TABS: 80.0000 mg | ORAL_TABLET | Freq: Every day | ORAL | 3 refills | Status: DC

## 2017-02-19 NOTE — Telephone Encounter (Signed)
Refill was resent to pharmacy.

## 2017-02-19 NOTE — Telephone Encounter (Signed)
Pt states [facility] did not get his Rx telmisartan (MICARDIS) 80 MG tablet Would like you to resend.  Fax  [phone] Phone [phone] (dr line)

## 2017-02-24 DIAGNOSIS — Z79899 Other long term (current) drug therapy: Secondary | ICD-10-CM | POA: Diagnosis not present

## 2017-02-24 DIAGNOSIS — H209 Unspecified iridocyclitis: Secondary | ICD-10-CM | POA: Diagnosis not present

## 2017-02-24 DIAGNOSIS — M459 Ankylosing spondylitis of unspecified sites in spine: Secondary | ICD-10-CM | POA: Diagnosis not present

## 2017-03-04 DIAGNOSIS — Z23 Encounter for immunization: Secondary | ICD-10-CM | POA: Diagnosis not present

## 2017-03-11 ENCOUNTER — Telehealth: Payer: Self-pay | Admitting: Internal Medicine

## 2017-03-11 NOTE — Telephone Encounter (Signed)
Pharm  is calling to clarify if pt should be taking both bp med micardis 80mg  and telmisartan-hctz 80-12.5mg  . The reference number [id number]

## 2017-03-11 NOTE — Telephone Encounter (Signed)
Please advise 

## 2017-03-11 NOTE — Telephone Encounter (Signed)
Micardis 80 mg only

## 2017-03-12 NOTE — Telephone Encounter (Signed)
Spoke with [facility] pharmacist from [facility] and clarified medication.

## 2017-04-04 DIAGNOSIS — R0989 Other specified symptoms and signs involving the circulatory and respiratory systems: Secondary | ICD-10-CM | POA: Diagnosis not present

## 2017-04-07 ENCOUNTER — Encounter (INDEPENDENT_AMBULATORY_CARE_PROVIDER_SITE_OTHER): Payer: 59 | Admitting: Ophthalmology

## 2017-04-07 ENCOUNTER — Other Ambulatory Visit: Payer: Self-pay | Admitting: Internal Medicine

## 2017-04-07 DIAGNOSIS — H353132 Nonexudative age-related macular degeneration, bilateral, intermediate dry stage: Secondary | ICD-10-CM | POA: Diagnosis not present

## 2017-04-07 DIAGNOSIS — H35372 Puckering of macula, left eye: Secondary | ICD-10-CM | POA: Diagnosis not present

## 2017-04-07 DIAGNOSIS — I10 Essential (primary) hypertension: Secondary | ICD-10-CM | POA: Diagnosis not present

## 2017-04-07 DIAGNOSIS — H43813 Vitreous degeneration, bilateral: Secondary | ICD-10-CM | POA: Diagnosis not present

## 2017-04-07 DIAGNOSIS — H35033 Hypertensive retinopathy, bilateral: Secondary | ICD-10-CM

## 2017-04-11 ENCOUNTER — Other Ambulatory Visit: Payer: Self-pay | Admitting: Internal Medicine

## 2017-06-16 ENCOUNTER — Encounter (INDEPENDENT_AMBULATORY_CARE_PROVIDER_SITE_OTHER): Payer: 59 | Admitting: Ophthalmology

## 2017-06-16 DIAGNOSIS — I10 Essential (primary) hypertension: Secondary | ICD-10-CM | POA: Diagnosis not present

## 2017-06-16 DIAGNOSIS — H35372 Puckering of macula, left eye: Secondary | ICD-10-CM | POA: Diagnosis not present

## 2017-06-16 DIAGNOSIS — H353132 Nonexudative age-related macular degeneration, bilateral, intermediate dry stage: Secondary | ICD-10-CM | POA: Diagnosis not present

## 2017-06-16 DIAGNOSIS — H43813 Vitreous degeneration, bilateral: Secondary | ICD-10-CM | POA: Diagnosis not present

## 2017-06-16 DIAGNOSIS — H35033 Hypertensive retinopathy, bilateral: Secondary | ICD-10-CM

## 2017-07-04 ENCOUNTER — Other Ambulatory Visit: Payer: Self-pay | Admitting: Internal Medicine

## 2017-07-08 DIAGNOSIS — L738 Other specified follicular disorders: Secondary | ICD-10-CM | POA: Diagnosis not present

## 2017-08-26 ENCOUNTER — Encounter: Payer: Self-pay | Admitting: Internal Medicine

## 2017-08-26 DIAGNOSIS — H40013 Open angle with borderline findings, low risk, bilateral: Secondary | ICD-10-CM | POA: Diagnosis not present

## 2017-08-26 DIAGNOSIS — H353131 Nonexudative age-related macular degeneration, bilateral, early dry stage: Secondary | ICD-10-CM | POA: Diagnosis not present

## 2017-08-26 DIAGNOSIS — H35712 Central serous chorioretinopathy, left eye: Secondary | ICD-10-CM | POA: Diagnosis not present

## 2017-08-26 DIAGNOSIS — Z961 Presence of intraocular lens: Secondary | ICD-10-CM | POA: Diagnosis not present

## 2017-09-01 DIAGNOSIS — H209 Unspecified iridocyclitis: Secondary | ICD-10-CM | POA: Diagnosis not present

## 2017-09-01 DIAGNOSIS — M542 Cervicalgia: Secondary | ICD-10-CM | POA: Diagnosis not present

## 2017-09-01 DIAGNOSIS — M459 Ankylosing spondylitis of unspecified sites in spine: Secondary | ICD-10-CM | POA: Diagnosis not present

## 2017-09-02 ENCOUNTER — Other Ambulatory Visit: Payer: Self-pay | Admitting: Internal Medicine

## 2017-09-08 DIAGNOSIS — L820 Inflamed seborrheic keratosis: Secondary | ICD-10-CM | POA: Diagnosis not present

## 2017-09-15 ENCOUNTER — Encounter (INDEPENDENT_AMBULATORY_CARE_PROVIDER_SITE_OTHER): Payer: 59 | Admitting: Ophthalmology

## 2017-09-18 ENCOUNTER — Encounter (INDEPENDENT_AMBULATORY_CARE_PROVIDER_SITE_OTHER): Payer: 59 | Admitting: Ophthalmology

## 2017-09-18 DIAGNOSIS — H35372 Puckering of macula, left eye: Secondary | ICD-10-CM | POA: Diagnosis not present

## 2017-09-18 DIAGNOSIS — I10 Essential (primary) hypertension: Secondary | ICD-10-CM | POA: Diagnosis not present

## 2017-09-18 DIAGNOSIS — H353132 Nonexudative age-related macular degeneration, bilateral, intermediate dry stage: Secondary | ICD-10-CM | POA: Diagnosis not present

## 2017-09-18 DIAGNOSIS — H43813 Vitreous degeneration, bilateral: Secondary | ICD-10-CM

## 2017-09-18 DIAGNOSIS — H35033 Hypertensive retinopathy, bilateral: Secondary | ICD-10-CM

## 2017-10-13 DIAGNOSIS — L814 Other melanin hyperpigmentation: Secondary | ICD-10-CM | POA: Diagnosis not present

## 2017-10-13 DIAGNOSIS — L570 Actinic keratosis: Secondary | ICD-10-CM | POA: Diagnosis not present

## 2017-10-13 DIAGNOSIS — D485 Neoplasm of uncertain behavior of skin: Secondary | ICD-10-CM | POA: Diagnosis not present

## 2017-11-14 ENCOUNTER — Other Ambulatory Visit: Payer: Self-pay | Admitting: Internal Medicine

## 2017-11-24 ENCOUNTER — Other Ambulatory Visit: Payer: Self-pay | Admitting: Internal Medicine

## 2017-11-25 ENCOUNTER — Other Ambulatory Visit: Payer: Self-pay | Admitting: Internal Medicine

## 2017-12-16 ENCOUNTER — Encounter: Payer: 59 | Admitting: Internal Medicine

## 2017-12-19 ENCOUNTER — Encounter (INDEPENDENT_AMBULATORY_CARE_PROVIDER_SITE_OTHER): Payer: 59 | Admitting: Ophthalmology

## 2017-12-19 DIAGNOSIS — H35033 Hypertensive retinopathy, bilateral: Secondary | ICD-10-CM | POA: Diagnosis not present

## 2017-12-19 DIAGNOSIS — H35372 Puckering of macula, left eye: Secondary | ICD-10-CM | POA: Diagnosis not present

## 2017-12-19 DIAGNOSIS — I10 Essential (primary) hypertension: Secondary | ICD-10-CM | POA: Diagnosis not present

## 2017-12-19 DIAGNOSIS — H353111 Nonexudative age-related macular degeneration, right eye, early dry stage: Secondary | ICD-10-CM | POA: Diagnosis not present

## 2017-12-19 DIAGNOSIS — H43813 Vitreous degeneration, bilateral: Secondary | ICD-10-CM

## 2017-12-19 DIAGNOSIS — H353122 Nonexudative age-related macular degeneration, left eye, intermediate dry stage: Secondary | ICD-10-CM

## 2017-12-31 ENCOUNTER — Encounter: Payer: Self-pay | Admitting: Family Medicine

## 2017-12-31 ENCOUNTER — Ambulatory Visit: Payer: 59 | Admitting: Family Medicine

## 2017-12-31 VITALS — BP 124/82 | HR 74 | Temp 98.4°F | Ht 70.0 in | Wt 203.0 lb

## 2017-12-31 DIAGNOSIS — F17210 Nicotine dependence, cigarettes, uncomplicated: Secondary | ICD-10-CM | POA: Diagnosis not present

## 2017-12-31 DIAGNOSIS — E782 Mixed hyperlipidemia: Secondary | ICD-10-CM | POA: Diagnosis not present

## 2017-12-31 DIAGNOSIS — M459 Ankylosing spondylitis of unspecified sites in spine: Secondary | ICD-10-CM | POA: Diagnosis not present

## 2017-12-31 DIAGNOSIS — I10 Essential (primary) hypertension: Secondary | ICD-10-CM | POA: Diagnosis not present

## 2017-12-31 NOTE — Progress Notes (Signed)
Subjective:    Patient ID: Brandon [name], male    DOB: 1956-03-26, 62 y.o.   MRN: [id number]  No chief complaint on file.   HPI Patient was seen today for [facility], previously seen by Dr. Cordelia Pen and f/u on chronic conditions.   Nicotine dependence: -pt motivated to quit smoking -has noticed SOB when going up stairs -smoking 1/2 ppd x30 years -Patient has Rx for Chantix at home, but has not started it yet.  Ankylosing spondylitis: -Taking Enbrel injections weekly -Seeing Dr. Trudie Reed, rheumatology -Patient notes improvement in pain with temporal though still has limited ROM  HTN: -Taking atenolol 50 mg daily and telmisartan 80 mg daily  HLD: -Taking atorvastatin 80 mg daily -Patient denies myalgias   Past Medical History:  Diagnosis Date  . Ankylosing spondylitis ([place])   . Arthritis   . BENIGN PROSTATIC HYPERTROPHY 06/07/2008  . COLONIC POLYPS, HX OF 06/07/2008  . CORONARY ARTERY DISEASE 06/07/2008  . H/O hiatal hernia   . Heart murmur   . HYPERLIPIDEMIA 06/07/2008  . HYPERTENSION 06/07/2008  . Myocardial infarction ([place])   . NEPHROLITHIASIS, HX OF 06/07/2008    No Known Allergies  ROS General: Denies fever, chills, night sweats, changes in weight, changes in appetite HEENT: Denies headaches, ear pain, changes in vision, rhinorrhea, sore throat CV: Denies CP, palpitations, SOB, orthopnea Pulm: Denies SOB, cough, wheezing GI: Denies abdominal pain, nausea, vomiting, diarrhea, constipation GU: Denies dysuria, hematuria, frequency, vaginal discharge Msk: Denies muscle cramps   + joint/back pains Neuro: Denies weakness, numbness, tingling Skin: Denies rashes, bruising Psych: Denies depression, anxiety, hallucinations     Objective:    Blood pressure 124/82, pulse 74, temperature 98.4 F (36.9 C), temperature source Oral, height 5\' 10"  (1.778 m), weight 203 lb (92.1 kg), SpO2 98 %.   Gen. Pleasant, well-nourished, in no distress, normal affect   HEENT: [place]/AT, face  symmetric, no scleral icterus, PERRLA, nares patent without drainage Lungs: no accessory muscle use, CTAB, no wheezes or rales Cardiovascular: RRR, soft murmur 1/6, no peripheral edema Neuro:  A&Ox3, CN II-XII intact, normal gait    Wt Readings from Last 3 Encounters:  12/31/17 203 lb (92.1 kg)  12/13/16 192 lb 3.2 oz (87.2 kg)  01/17/16 195 lb (88.5 kg)    Lab Results  Component Value Date   WBC 10.6 (H) 12/06/2016   HGB 16.1 12/06/2016   HCT 47.7 12/06/2016   PLT 236.0 12/06/2016   GLUCOSE 97 12/06/2016   CHOL 138 12/06/2016   TRIG 67.0 12/06/2016   HDL 50.70 12/06/2016   LDLCALC 74 12/06/2016   ALT 22 12/06/2016   AST 19 12/06/2016   NA 129 (L) 12/06/2016   K 4.8 12/06/2016   CL 94 (L) 12/06/2016   CREATININE 0.79 12/06/2016   BUN 10 12/06/2016   CO2 29 12/06/2016   TSH 0.76 12/06/2016   PSA 0.92 12/06/2016    Assessment/Plan:  Cigarette nicotine dependence without complication -Smoking cessation counseling greater than 3 minutes, less than 10 minutes -Given motivation to quit suggested starting Chantix. -We will reassess at each OFV.  Ankylosing spondylitis, unspecified site of spine ([place]) -Continue Enbrel weekly -Continue following with rheumatology, Dr. Trudie Reed  Mixed hyperlipidemia -Continue atorvastatin 80 mg daily  Essential hypertension -Controlled -Continue lifestyle modifications -Continue atenolol 50 mg daily, telmisartan 80 mg daily  Follow-up PRN.  Pt to schedule physical around December.  Grier Mitts, MD

## 2018-01-11 ENCOUNTER — Other Ambulatory Visit: Payer: Self-pay | Admitting: Internal Medicine

## 2018-01-20 ENCOUNTER — Other Ambulatory Visit: Payer: Self-pay | Admitting: Internal Medicine

## 2018-01-22 NOTE — Telephone Encounter (Signed)
Dr.Banks pt

## 2018-01-23 ENCOUNTER — Other Ambulatory Visit: Payer: Self-pay

## 2018-02-04 DIAGNOSIS — H43812 Vitreous degeneration, left eye: Secondary | ICD-10-CM | POA: Diagnosis not present

## 2018-02-04 DIAGNOSIS — H43392 Other vitreous opacities, left eye: Secondary | ICD-10-CM | POA: Diagnosis not present

## 2018-02-13 ENCOUNTER — Other Ambulatory Visit: Payer: Self-pay | Admitting: Family Medicine

## 2018-02-13 MED ORDER — IBUPROFEN 800 MG PO TABS: 800.0000 mg | ORAL_TABLET | Freq: Three times a day (TID) | ORAL | 3 refills | Status: DC | PRN

## 2018-02-13 MED ORDER — SILDENAFIL CITRATE 50 MG PO TABS: ORAL_TABLET | ORAL | 5 refills | Status: DC

## 2018-02-13 NOTE — Telephone Encounter (Signed)
Copied from [phone]. Topic: Quick Communication - Rx Refill/Question >> Feb 13, 2018  8:25 AM Brandon [name] wrote: Medication: ibuprofen (ADVIL,MOTRIN) 800 MG tablet - pt has 1 week left & VIAGRA 50 MG tablet  - pt has 1 wk left Pt states the pharmacy has been requesting for a week 90 day supply thru mail order required  Has the patient contacted their pharmacy? yes Preferred Pharmacy (with phone number or street name): [facility]

## 2018-02-13 NOTE — Telephone Encounter (Signed)
Requested medication (s) are due for refill today: yes  Requested medication (s) are on the active medication list: yes  Last refill:  Viagra 12/05/13  Advil 07/07/17  Future visit scheduled: no  Notes to clinic:  See request    Requested Prescriptions  Pending Prescriptions Disp Refills   sildenafil (VIAGRA) 50 MG tablet 10 tablet 6    Sig: take 1 tablet by mouth once daily if needed     Urology: Erectile Dysfunction Agents Passed - 02/13/2018  8:30 AM      Passed - Last BP in normal range    BP Readings from Last 1 Encounters:  12/31/17 124/82         Passed - Valid encounter within last 12 months    Recent Outpatient Visits          1 month ago Cigarette nicotine dependence without complication   Therapist, [facility] at [place], MD   1 year ago Encounter for preventive health examination   Therapist, [facility] at [facility], Doretha Sou, MD   2 years ago Encounter for preventive health examination   Therapist[facility] at [facility], Doretha Sou, MD   3 years ago Atherosclerosis of native coronary artery of native heart without angina pectoris   [place] at [facility], Doretha Sou, MD   4 years ago Routine general medical examination at a health care facility   [facility] at [place], Doretha Sou, MD      Future Appointments            In 1 month Volanda [place], Langley Adie, MD [place] [facility] [place], [facility]          ibuprofen (ADVIL,MOTRIN) 800 MG tablet 270 tablet 1    Sig: Take 1 tablet (800 mg total) by mouth every 8 (eight) hours as needed.     Analgesics:  NSAIDS Failed - 02/13/2018  8:30 AM      Failed - Cr in normal range and within 360 days    Creatinine, Ser  Date Value Ref Range Status  12/06/2016 0.79 0.40 - 1.50 mg/dL Final         Failed - HGB in normal range and within 360 days    Hemoglobin  Date Value Ref Range Status  12/06/2016 16.1 13.0 - 17.0 g/dL Final         Passed -  Patient is not pregnant      Passed - Valid encounter within last 12 months    Recent Outpatient Visits          1 month ago Cigarette nicotine dependence without complication   Therapist, music at [place], MD   1 year ago Encounter for preventive health examination   Therapist, music at [facility], Doretha Sou, MD   2 years ago Encounter for preventive health examination   Therapist, music at [facility], Doretha Sou, MD   3 years ago Atherosclerosis of native coronary artery of native heart without angina pectoris   [place] at [facility], Doretha Sou, MD   4 years ago Routine general medical examination at a health care facility   [employer] at [place], Doretha Sou, MD      Future Appointments            In 1 month Volanda [place], Langley Adie, MD [employer] at [place], [facility]

## 2018-02-13 NOTE — Telephone Encounter (Signed)
Please Advise if ok to refill both Rx

## 2018-03-02 ENCOUNTER — Telehealth: Payer: Self-pay | Admitting: Family Medicine

## 2018-03-02 NOTE — Telephone Encounter (Signed)
Copied from [place] [id number]. Topic: Quick Communication - Rx Refill/Question >> Mar 02, 2018 12:58 PM Jarold Motto, Fraser Din wrote: Medication: telmisartan (MICARDIS) 80 MG tablet [[id number]] pt called and stated that this medication will be out long term. Pt would like to know if something else could be called in . Please advise Cb#[phone]

## 2018-03-03 ENCOUNTER — Other Ambulatory Visit: Payer: Self-pay | Admitting: Family Medicine

## 2018-03-03 MED ORDER — OLMESARTAN MEDOXOMIL 40 MG PO TABS: 40.0000 mg | ORAL_TABLET | Freq: Every day | ORAL | 3 refills | Status: DC

## 2018-03-03 NOTE — Telephone Encounter (Signed)
Please Advise

## 2018-03-03 NOTE — Telephone Encounter (Signed)
Benicar 40 mg #30, refills 3 sent to patient's pharmacy.  If patient likes this medication will send in Rx for 90-day supply.

## 2018-03-05 NOTE — Telephone Encounter (Signed)
Spoke with pt aware of the new Rx sent to his pharmacy.

## 2018-03-06 ENCOUNTER — Other Ambulatory Visit: Payer: Self-pay

## 2018-03-12 DIAGNOSIS — H209 Unspecified iridocyclitis: Secondary | ICD-10-CM | POA: Diagnosis not present

## 2018-03-12 DIAGNOSIS — M459 Ankylosing spondylitis of unspecified sites in spine: Secondary | ICD-10-CM | POA: Diagnosis not present

## 2018-03-12 DIAGNOSIS — M545 Low back pain: Secondary | ICD-10-CM | POA: Diagnosis not present

## 2018-03-12 DIAGNOSIS — Z79899 Other long term (current) drug therapy: Secondary | ICD-10-CM | POA: Diagnosis not present

## 2018-03-19 DIAGNOSIS — H43392 Other vitreous opacities, left eye: Secondary | ICD-10-CM | POA: Diagnosis not present

## 2018-03-19 DIAGNOSIS — H43812 Vitreous degeneration, left eye: Secondary | ICD-10-CM | POA: Diagnosis not present

## 2018-04-13 ENCOUNTER — Encounter: Payer: Self-pay | Admitting: Family Medicine

## 2018-04-13 ENCOUNTER — Ambulatory Visit (INDEPENDENT_AMBULATORY_CARE_PROVIDER_SITE_OTHER): Payer: 59 | Admitting: Family Medicine

## 2018-04-13 VITALS — BP 120/70 | HR 74 | Temp 98.5°F | Wt 203.0 lb

## 2018-04-13 DIAGNOSIS — Z125 Encounter for screening for malignant neoplasm of prostate: Secondary | ICD-10-CM | POA: Diagnosis not present

## 2018-04-13 DIAGNOSIS — Z13220 Encounter for screening for lipoid disorders: Secondary | ICD-10-CM | POA: Diagnosis not present

## 2018-04-13 DIAGNOSIS — M4328 Fusion of spine, sacral and sacrococcygeal region: Secondary | ICD-10-CM | POA: Diagnosis not present

## 2018-04-13 DIAGNOSIS — F17210 Nicotine dependence, cigarettes, uncomplicated: Secondary | ICD-10-CM | POA: Diagnosis not present

## 2018-04-13 DIAGNOSIS — Z131 Encounter for screening for diabetes mellitus: Secondary | ICD-10-CM | POA: Diagnosis not present

## 2018-04-13 DIAGNOSIS — Z0000 Encounter for general adult medical examination without abnormal findings: Secondary | ICD-10-CM | POA: Diagnosis not present

## 2018-04-13 DIAGNOSIS — I10 Essential (primary) hypertension: Secondary | ICD-10-CM | POA: Diagnosis not present

## 2018-04-13 DIAGNOSIS — Z23 Encounter for immunization: Secondary | ICD-10-CM | POA: Diagnosis not present

## 2018-04-13 LAB — LIPID PANEL
Cholesterol: 145 mg/dL (ref 0–200)
HDL: 56.8 mg/dL (ref >39.00)
LDL Cholesterol: 72 mg/dL (ref 0–99)
NonHDL: 88.65
Total CHOL/HDL Ratio: 3
Triglycerides: 81 mg/dL (ref 0.0–149.0)
VLDL: 16.2 mg/dL (ref 0.0–40.0)

## 2018-04-13 LAB — HEMOGLOBIN A1C: Hgb A1c MFr Bld: 5.9 % (ref 4.6–6.5)

## 2018-04-13 LAB — CBC WITH DIFFERENTIAL/PLATELET
Basophils Absolute: 0.1 10*3/uL (ref 0.0–0.1)
Basophils Relative: 0.6 % (ref 0.0–3.0)
Eosinophils Absolute: 0.5 10*3/uL (ref 0.0–0.7)
Eosinophils Relative: 5 % (ref 0.0–5.0)
HCT: 47.7 % (ref 39.0–52.0)
Hemoglobin: 16.3 g/dL (ref 13.0–17.0)
Lymphocytes Relative: 14.7 % (ref 12.0–46.0)
Lymphs Abs: 1.4 10*3/uL (ref 0.7–4.0)
MCHC: 34.2 g/dL (ref 30.0–36.0)
MCV: 92.2 fl (ref 78.0–100.0)
Monocytes Absolute: 0.9 10*3/uL (ref 0.1–1.0)
Monocytes Relative: 9 % (ref 3.0–12.0)
Neutro Abs: 6.9 10*3/uL (ref 1.4–7.7)
Neutrophils Relative %: 70.7 % (ref 43.0–77.0)
Platelets: 235 10*3/uL (ref 150.0–400.0)
RBC: 5.17 Mil/uL (ref 4.22–5.81)
RDW: 13.8 % (ref 11.5–15.5)
WBC: 9.7 10*3/uL (ref 4.0–10.5)

## 2018-04-13 LAB — BASIC METABOLIC PANEL
BUN: 10 mg/dL (ref 6–23)
CO2: 27 mEq/L (ref 19–32)
Calcium: 9.6 mg/dL (ref 8.4–10.5)
Chloride: 95 mEq/L — ABNORMAL LOW (ref 96–112)
Creatinine, Ser: 0.79 mg/dL (ref 0.40–1.50)
GFR: 104.76 mL/min (ref >60.00)
Glucose, Bld: 103 mg/dL — ABNORMAL HIGH (ref 70–99)
Potassium: 5 mEq/L (ref 3.5–5.1)
Sodium: 130 mEq/L — ABNORMAL LOW (ref 135–145)

## 2018-04-13 LAB — PSA: PSA: 1.17 ng/mL (ref 0.10–4.00)

## 2018-04-13 MED ORDER — OLMESARTAN MEDOXOMIL 40 MG PO TABS: 40.0000 mg | ORAL_TABLET | Freq: Every day | ORAL | 3 refills | Status: DC

## 2018-04-13 MED ORDER — TAMSULOSIN HCL 0.4 MG PO CAPS: 0.4000 mg | ORAL_CAPSULE | Freq: Every day | ORAL | 1 refills | Status: DC

## 2018-04-13 MED ORDER — IBUPROFEN 800 MG PO TABS: 800.0000 mg | ORAL_TABLET | Freq: Three times a day (TID) | ORAL | 3 refills | Status: DC | PRN

## 2018-04-13 MED ORDER — ATORVASTATIN CALCIUM 80 MG PO TABS: ORAL_TABLET | ORAL | 3 refills | Status: DC

## 2018-04-13 NOTE — Progress Notes (Signed)
Subjective:     Brandon [name] is a 62 y.o. male and is here for a comprehensive physical exam. The patient reports no problems.   Pt states he is still smoking.  States he does not wish to quit smoking makes him feel good.  Pt states when he has tried to cut down he felt "foggy in the head".  Requesting influenza vaccine  Requesting refills on medications to be sent to [facility] [address]  Requesting  Ibuprofen 800 mg. Takes BID for joint pain, still taking Enbrel for ankylosing spondylosis.    HTN: Taking olmesartan 40 mg  Social History   Socioeconomic History  . Marital status: Single    Spouse name: Not on file  . Number of children: Not on file  . Years of education: Not on file  . Highest education level: Not on file  Occupational History  . Occupation: Best boy: [employer]  . Financial resource strain: Not on file  . Food insecurity:    Worry: Not on file    Inability: Not on file  . Transportation needs:    Medical: Not on file    Non-medical: Not on file  Tobacco Use  . Smoking status: Current Every Day Smoker    Packs/day: 0.50    Years: 40.00    Pack years: 20.00    Types: Cigarettes  . Smokeless tobacco: Never Used  Substance and Sexual Activity  . Alcohol use: Yes    Comment: daily  . Drug use: No  . Sexual activity: Not on file  Lifestyle  . Physical activity:    Days per week: Not on file    Minutes per session: Not on file  . Stress: Not on file  Relationships  . Social connections:    Talks on phone: Not on file    Gets together: Not on file    Attends religious service: Not on file    Active member of club or organization: Not on file    Attends meetings of clubs or organizations: Not on file    Relationship status: Not on file  . Intimate partner violence:    Fear of current or ex partner: Not on file    Emotionally abused: Not on file    Physically abused: Not on file    Forced sexual activity: Not  on file  Other Topics Concern  . Not on file  Social History Narrative  . Not on file   Health Maintenance  Topic Date Due  . Hepatitis C Screening  Jul 27, 1955  . HIV Screening  09/14/1968  . COLONOSCOPY  01/05/2017  . INFLUENZA VACCINE  12/11/2017  . TETANUS/TDAP  09/11/2021    The following portions of the patient's history were reviewed and updated as appropriate: allergies, current medications, past family history, past medical history, past social history, past surgical history and problem list.  Review of Systems A comprehensive review of systems was negative.   Objective:    BP 120/70 (BP Location: Left Arm)   Pulse 74   Temp 98.5 F (36.9 C) (Oral)   Wt 203 lb (92.1 kg)   SpO2 97%   BMI 29.13 kg/m  General appearance: alert, cooperative and no distress Head: Normocephalic, without obvious abnormality, atraumatic Eyes: conjunctivae/corneas clear. PERRL, EOM's intact. Fundi benign. Ears: normal TM's and external ear canals both ears Nose: Nares normal. Septum midline. Mucosa normal. No drainage or sinus tenderness. Throat: lips, mucosa, and tongue normal;  teeth and gums normal Neck: no adenopathy, no carotid bruit, no JVD, supple, symmetrical, trachea midline and thyroid not enlarged, symmetric, no tenderness/mass/nodules Lungs: clear to auscultation bilaterally Heart: RRR, 2/6 murmur best heard in RUSB, no peripheral edema Abdomen: soft, non-tender; bowel sounds normal; no masses,  no organomegaly epigastric hernia Extremities: extremities normal, atraumatic, no cyanosis or edema Skin: Skin color, texture, turgor normal. No rashes or lesions or Seborrheic keratosis on left elbow Neurologic: Alert and oriented X 3, normal strength and tone. Normal symmetric reflexes. Normal coordination and gait    Assessment:    Healthy male exam w/ h/o HTN, ankylosing spondylitis, nicotine dependence .      Plan:     Anticipatory guidance given including wearing seatbelts,  smoke detectors in the home, increasing physical activity, increasing p.o. intake of water and vegetables. -will obtain labs  -given influenza vaccine -given handout -next CPE in 1 yr See After Visit Summary for Counseling Recommendations    Ankylosing spondylitis, SI joint -Continue Enbrel and ibuprofen as needed  Nicotine dependence -Smoking cessation greater than 3 minutes, less than 10 minutes -Discussed various ways to quit.  Given handout -We will readdress at each visit.  HTN -controlled -continue olemsartan 40 mg daily -discussed lifestyle modifications  Follow-up PRN  Grier Mitts, MD

## 2018-04-13 NOTE — Patient Instructions (Signed)
Preventive Care 40-64 Years, Male Preventive care refers to lifestyle choices and visits with your health care provider that can promote health and wellness. What does preventive care include?  A yearly physical exam. This is also called an annual well check.  Dental exams once or twice a year.  Routine eye exams. Ask your health care provider how often you should have your eyes checked.  Personal lifestyle choices, including: ? Daily care of your teeth and gums. ? Regular physical activity. ? Eating a healthy diet. ? Avoiding tobacco and drug use. ? Limiting alcohol use. ? Practicing safe sex. ? Taking low-dose aspirin every day starting at age 39. What happens during an annual well check? The services and screenings done by your health care provider during your annual well check will depend on your age, overall health, lifestyle risk factors, and family history of disease. Counseling Your health care provider may ask you questions about your:  Alcohol use.  Tobacco use.  Drug use.  Emotional well-being.  Home and relationship well-being.  Sexual activity.  Eating habits.  Work and work Statistician.  Screening You may have the following tests or measurements:  Height, weight, and BMI.  Blood pressure.  Lipid and cholesterol levels. These may be checked every 5 years, or more frequently if you are over 76 years old.  Skin check.  Lung cancer screening. You may have this screening every year starting at age 19 if you have a 30-pack-year history of smoking and currently smoke or have quit within the past 15 years.  Fecal occult blood test (FOBT) of the stool. You may have this test every year starting at age 26.  Flexible sigmoidoscopy or colonoscopy. You may have a sigmoidoscopy every 5 years or a colonoscopy every 10 years starting at age 17.  Prostate cancer screening. Recommendations will vary depending on your family history and other risks.  Hepatitis C  blood test.  Hepatitis B blood test.  Sexually transmitted disease (STD) testing.  Diabetes screening. This is done by checking your blood sugar (glucose) after you have not eaten for a while (fasting). You may have this done every 1-3 years.  Discuss your test results, treatment options, and if necessary, the need for more tests with your health care provider. Vaccines Your health care provider may recommend certain vaccines, such as:  Influenza vaccine. This is recommended every year.  Tetanus, diphtheria, and acellular pertussis (Tdap, Td) vaccine. You may need a Td booster every 10 years.  Varicella vaccine. You may need this if you have not been vaccinated.  Zoster vaccine. You may need this after age 79.  Measles, mumps, and rubella (MMR) vaccine. You may need at least one dose of MMR if you were born in 1957 or later. You may also need a second dose.  Pneumococcal 13-valent conjugate (PCV13) vaccine. You may need this if you have certain conditions and have not been vaccinated.  Pneumococcal polysaccharide (PPSV23) vaccine. You may need one or two doses if you smoke cigarettes or if you have certain conditions.  Meningococcal vaccine. You may need this if you have certain conditions.  Hepatitis A vaccine. You may need this if you have certain conditions or if you travel or work in places where you may be exposed to hepatitis A.  Hepatitis B vaccine. You may need this if you have certain conditions or if you travel or work in places where you may be exposed to hepatitis B.  Haemophilus influenzae type b (Hib) vaccine.  You may need this if you have certain risk factors.  Talk to your health care provider about which screenings and vaccines you need and how often you need them. This information is not intended to replace advice given to you by your health care provider. Make sure you discuss any questions you have with your health care provider. Document Released: 05/26/2015  Document Revised: 01/17/2016 Document Reviewed: 02/28/2015 Elsevier Interactive Patient Education  2018 [employer].  Steps to Quit Smoking Smoking tobacco can be harmful to your health and can affect almost every organ in your body. Smoking puts you, and those around you, at risk for developing many serious chronic diseases. Quitting smoking is difficult, but it is one of the best things that you can do for your health. It is never too late to quit. What are the benefits of quitting smoking? When you quit smoking, you lower your risk of developing serious diseases and conditions, such as:  Lung cancer or lung disease, such as COPD.  Heart disease.  Stroke.  Heart attack.  Infertility.  Osteoporosis and bone fractures.  Additionally, symptoms such as coughing, wheezing, and shortness of breath may get better when you quit. You may also find that you get sick less often because your body is stronger at fighting off colds and infections. If you are pregnant, quitting smoking can help to reduce your chances of having a baby of low birth weight. How do I get ready to quit? When you decide to quit smoking, create a plan to make sure that you are successful. Before you quit:  Pick a date to quit. Set a date within the next two weeks to give you time to prepare.  Write down the reasons why you are quitting. Keep this list in places where you will see it often, such as on your bathroom mirror or in your car or wallet.  Identify the people, places, things, and activities that make you want to smoke (triggers) and avoid them. Make sure to take these actions: ? Throw away all cigarettes at home, at work, and in your car. ? Throw away smoking accessories, such as Scientist, research (medical). ? Clean your car and make sure to empty the ashtray. ? Clean your home, including curtains and carpets.  Tell your family, friends, and coworkers that you are quitting. Support from your loved ones can make  quitting easier.  Talk with your health care provider about your options for quitting smoking.  Find out what treatment options are covered by your health insurance.  What strategies can I use to quit smoking? Talk with your healthcare provider about different strategies to quit smoking. Some strategies include:  Quitting smoking altogether instead of gradually lessening how much you smoke over a period of time. Research shows that quitting "cold [place]" is more successful than gradually quitting.  Attending in-person counseling to help you build problem-solving skills. You are more likely to have success in quitting if you attend several counseling sessions. Even short sessions of 10 minutes can be effective.  Finding resources and support systems that can help you to quit smoking and remain smoke-free after you quit. These resources are most helpful when you use them often. They can include: ? Online chats with a Social worker. ? Telephone quitlines. ? Careers information officer. ? Support groups or group counseling. ? Text messaging programs. ? Mobile phone applications.  Taking medicines to help you quit smoking. (If you are pregnant or breastfeeding, talk with your health care provider  first.) Some medicines contain nicotine and some do not. Both types of medicines help with cravings, but the medicines that include nicotine help to relieve withdrawal symptoms. Your health care provider may recommend: ? Nicotine patches, gum, or lozenges. ? Nicotine inhalers or sprays. ? Non-nicotine medicine that is taken by mouth.  Talk with your health care provider about combining strategies, such as taking medicines while you are also receiving in-person counseling. Using these two strategies together makes you more likely to succeed in quitting than if you used either strategy on its own. If you are pregnant or breastfeeding, talk with your health care provider about finding counseling or other  support strategies to quit smoking. Do not take medicine to help you quit smoking unless told to do so by your health care provider. What things can I do to make it easier to quit? Quitting smoking might feel overwhelming at first, but there is a lot that you can do to make it easier. Take these important actions:  Reach out to your family and friends and ask that they support and encourage you during this time. Call telephone quitlines, reach out to support groups, or work with a counselor for support.  Ask people who smoke to avoid smoking around you.  Avoid places that trigger you to smoke, such as bars, parties, or smoke-break areas at work.  Spend time around people who do not smoke.  Lessen stress in your life, because stress can be a smoking trigger for some people. To lessen stress, try: ? Exercising regularly. ? Deep-breathing exercises. ? Yoga. ? Meditating. ? Performing a body scan. This involves closing your eyes, scanning your body from head to toe, and noticing which parts of your body are particularly tense. Purposefully relax the muscles in those areas.  Download or purchase mobile phone or tablet apps (applications) that can help you stick to your quit plan by providing reminders, tips, and encouragement. There are many free apps, such as QuitGuide from the [employer] Office manager for Disease Control and Prevention). You can find other support for quitting smoking (smoking cessation) through smokefree.gov and other websites.  How will I feel when I quit smoking? Within the first 24 hours of quitting smoking, you may start to feel some withdrawal symptoms. These symptoms are usually most noticeable 2-3 days after quitting, but they usually do not last beyond 2-3 weeks. Changes or symptoms that you might experience include:  Mood swings.  Restlessness, anxiety, or irritation.  Difficulty concentrating.  Dizziness.  Strong cravings for sugary foods in addition to nicotine.  Mild  weight gain.  Constipation.  Nausea.  Coughing or a sore throat.  Changes in how your medicines work in your body.  A depressed mood.  Difficulty sleeping (insomnia).  After the first 2-3 weeks of quitting, you may start to notice more positive results, such as:  Improved sense of smell and taste.  Decreased coughing and sore throat.  Slower heart rate.  Lower blood pressure.  Clearer skin.  The ability to breathe more easily.  Fewer sick days.  Quitting smoking is very challenging for most people. Do not get discouraged if you are not successful the first time. Some people need to make many attempts to quit before they achieve long-term success. Do your best to stick to your quit plan, and talk with your health care provider if you have any questions or concerns. This information is not intended to replace advice given to you by your health care provider. Make sure  you discuss any questions you have with your health care provider. Document Released: 04/23/2001 Document Revised: 12/26/2015 Document Reviewed: 09/13/2014 Elsevier Interactive Patient Education  2018 [employer].  Cholesterol Cholesterol is a white, waxy, fat-like substance that is needed by the human body in small amounts. The liver makes all the cholesterol we need. Cholesterol is carried from the liver by the blood through the blood vessels. Deposits of cholesterol (plaques) may build up on blood vessel (artery) walls. Plaques make the arteries narrower and stiffer. Cholesterol plaques increase the risk for heart attack and stroke. You cannot feel your cholesterol level even if it is very high. The only way to know that it is high is to have a blood test. Once you know your cholesterol levels, you should keep a record of the test results. Work with your health care provider to keep your levels in the desired range. What do the results mean?  Total cholesterol is a rough measure of all the cholesterol in your  blood.  LDL (low-density lipoprotein) is the "bad" cholesterol. This is the type that causes plaque to build up on the artery walls. You want this level to be low.  HDL (high-density lipoprotein) is the "good" cholesterol because it cleans the arteries and carries the LDL away. You want this level to be high.  Triglycerides are fat that the body can either burn for energy or store. High levels are closely linked to heart disease. What are the desired levels of cholesterol?  Total cholesterol below 200.  LDL below 100 for people who are at risk, below 70 for people at very high risk.  HDL above 40 is good. A level of 60 or higher is considered to be protective against heart disease.  Triglycerides below 150. How can I lower my cholesterol? Diet Follow your diet program as told by your health care provider.  Choose fish or white meat chicken and [place], roasted or baked. Limit fatty cuts of red meat, fried foods, and processed meats, such as sausage and lunch meats.  Eat lots of fresh fruits and vegetables.  Choose whole grains, beans, pasta, potatoes, and cereals.  Choose olive oil, corn oil, or canola oil, and use only small amounts.  Avoid butter, mayonnaise, shortening, or palm kernel oils.  Avoid foods with trans fats.  Drink skim or nonfat milk and eat low-fat or nonfat yogurt and cheeses. Avoid whole milk, cream, ice cream, egg yolks, and full-fat cheeses.  Healthier desserts include angel food cake, ginger snaps, animal crackers, hard candy, popsicles, and low-fat or nonfat frozen yogurt. Avoid pastries, cakes, pies, and cookies.  Exercise  Follow your exercise program as told by your health care provider. A regular program: ? Helps to decrease LDL and raise HDL. ? Helps with weight control.  Do things that increase your activity level, such as gardening, walking, and taking the stairs.  Ask your health care provider about ways that you can be more active in your  daily life.  Medicine  Take over-the-counter and prescription medicines only as told by your health care provider. ? Medicine may be prescribed by your health care provider to help lower cholesterol and decrease the risk for heart disease. This is usually done if diet and exercise have failed to bring down cholesterol levels. ? If you have several risk factors, you may need medicine even if your levels are normal.  This information is not intended to replace advice given to you by your health care provider. Make sure  you discuss any questions you have with your health care provider. Document Released: 01/22/2001 Document Revised: 11/25/2015 Document Reviewed: 10/28/2015 Elsevier Interactive Patient Education  [employer].

## 2018-04-20 ENCOUNTER — Encounter (INDEPENDENT_AMBULATORY_CARE_PROVIDER_SITE_OTHER): Payer: 59 | Admitting: Ophthalmology

## 2018-04-20 DIAGNOSIS — H35712 Central serous chorioretinopathy, left eye: Secondary | ICD-10-CM | POA: Diagnosis not present

## 2018-04-20 DIAGNOSIS — I10 Essential (primary) hypertension: Secondary | ICD-10-CM | POA: Diagnosis not present

## 2018-04-20 DIAGNOSIS — H353132 Nonexudative age-related macular degeneration, bilateral, intermediate dry stage: Secondary | ICD-10-CM | POA: Diagnosis not present

## 2018-04-20 DIAGNOSIS — H43813 Vitreous degeneration, bilateral: Secondary | ICD-10-CM

## 2018-04-20 DIAGNOSIS — H35033 Hypertensive retinopathy, bilateral: Secondary | ICD-10-CM | POA: Diagnosis not present

## 2018-04-23 ENCOUNTER — Telehealth: Payer: Self-pay

## 2018-04-29 DIAGNOSIS — H20021 Recurrent acute iridocyclitis, right eye: Secondary | ICD-10-CM | POA: Diagnosis not present

## 2018-05-14 DIAGNOSIS — H353131 Nonexudative age-related macular degeneration, bilateral, early dry stage: Secondary | ICD-10-CM | POA: Diagnosis not present

## 2018-05-14 DIAGNOSIS — H20021 Recurrent acute iridocyclitis, right eye: Secondary | ICD-10-CM | POA: Diagnosis not present

## 2018-05-14 DIAGNOSIS — H40013 Open angle with borderline findings, low risk, bilateral: Secondary | ICD-10-CM | POA: Diagnosis not present

## 2018-05-18 DIAGNOSIS — H20021 Recurrent acute iridocyclitis, right eye: Secondary | ICD-10-CM | POA: Diagnosis not present

## 2018-05-20 ENCOUNTER — Encounter (INDEPENDENT_AMBULATORY_CARE_PROVIDER_SITE_OTHER): Payer: 59 | Admitting: Ophthalmology

## 2018-05-20 DIAGNOSIS — H353132 Nonexudative age-related macular degeneration, bilateral, intermediate dry stage: Secondary | ICD-10-CM | POA: Diagnosis not present

## 2018-05-20 DIAGNOSIS — H44111 Panuveitis, right eye: Secondary | ICD-10-CM | POA: Diagnosis not present

## 2018-05-20 DIAGNOSIS — I10 Essential (primary) hypertension: Secondary | ICD-10-CM | POA: Diagnosis not present

## 2018-05-20 DIAGNOSIS — H35033 Hypertensive retinopathy, bilateral: Secondary | ICD-10-CM | POA: Diagnosis not present

## 2018-05-20 DIAGNOSIS — H35712 Central serous chorioretinopathy, left eye: Secondary | ICD-10-CM

## 2018-05-20 DIAGNOSIS — H43813 Vitreous degeneration, bilateral: Secondary | ICD-10-CM

## 2018-06-03 DIAGNOSIS — M450 Ankylosing spondylitis of multiple sites in spine: Secondary | ICD-10-CM | POA: Diagnosis not present

## 2018-06-03 DIAGNOSIS — H353132 Nonexudative age-related macular degeneration, bilateral, intermediate dry stage: Secondary | ICD-10-CM | POA: Diagnosis not present

## 2018-06-03 DIAGNOSIS — Z961 Presence of intraocular lens: Secondary | ICD-10-CM | POA: Diagnosis not present

## 2018-06-03 DIAGNOSIS — H30033 Focal chorioretinal inflammation, peripheral, bilateral: Secondary | ICD-10-CM | POA: Diagnosis not present

## 2018-06-16 DIAGNOSIS — Z961 Presence of intraocular lens: Secondary | ICD-10-CM | POA: Diagnosis not present

## 2018-06-16 DIAGNOSIS — H353132 Nonexudative age-related macular degeneration, bilateral, intermediate dry stage: Secondary | ICD-10-CM | POA: Diagnosis not present

## 2018-06-16 DIAGNOSIS — H30033 Focal chorioretinal inflammation, peripheral, bilateral: Secondary | ICD-10-CM | POA: Diagnosis not present

## 2018-07-02 DIAGNOSIS — K136 Irritative hyperplasia of oral mucosa: Secondary | ICD-10-CM | POA: Diagnosis not present

## 2018-07-11 ENCOUNTER — Other Ambulatory Visit: Payer: Self-pay | Admitting: Family Medicine

## 2018-07-11 DIAGNOSIS — I10 Essential (primary) hypertension: Secondary | ICD-10-CM

## 2018-07-29 DIAGNOSIS — H209 Unspecified iridocyclitis: Secondary | ICD-10-CM | POA: Diagnosis not present

## 2018-07-29 DIAGNOSIS — Z79899 Other long term (current) drug therapy: Secondary | ICD-10-CM | POA: Diagnosis not present

## 2018-07-29 DIAGNOSIS — M545 Low back pain: Secondary | ICD-10-CM | POA: Diagnosis not present

## 2018-07-29 DIAGNOSIS — M459 Ankylosing spondylitis of unspecified sites in spine: Secondary | ICD-10-CM | POA: Diagnosis not present

## 2018-08-17 ENCOUNTER — Other Ambulatory Visit: Payer: Self-pay | Admitting: Family Medicine

## 2018-08-18 NOTE — Telephone Encounter (Signed)
Ok to send refill

## 2018-08-24 ENCOUNTER — Other Ambulatory Visit: Payer: Self-pay

## 2018-08-24 ENCOUNTER — Encounter (INDEPENDENT_AMBULATORY_CARE_PROVIDER_SITE_OTHER): Payer: 59 | Admitting: Ophthalmology

## 2018-08-24 DIAGNOSIS — H35033 Hypertensive retinopathy, bilateral: Secondary | ICD-10-CM

## 2018-08-24 DIAGNOSIS — H353132 Nonexudative age-related macular degeneration, bilateral, intermediate dry stage: Secondary | ICD-10-CM

## 2018-08-24 DIAGNOSIS — H43813 Vitreous degeneration, bilateral: Secondary | ICD-10-CM

## 2018-08-24 DIAGNOSIS — I10 Essential (primary) hypertension: Secondary | ICD-10-CM | POA: Diagnosis not present

## 2018-08-24 DIAGNOSIS — H35712 Central serous chorioretinopathy, left eye: Secondary | ICD-10-CM | POA: Diagnosis not present

## 2018-09-22 ENCOUNTER — Other Ambulatory Visit: Payer: Self-pay | Admitting: Family Medicine

## 2018-10-08 ENCOUNTER — Other Ambulatory Visit: Payer: Self-pay | Admitting: Family Medicine

## 2018-10-12 NOTE — Telephone Encounter (Signed)
Pt LOV was  On 04/13/2018 and last refill was on 08/18/2018 for 90 tablets with 1 refill please advise if ok to send refill

## 2018-10-12 NOTE — Telephone Encounter (Signed)
Ibuprofen changed to BID prn, as pt at risk of GI bleed using Ibuprofen 800 mg TID prn.

## 2018-10-31 ENCOUNTER — Other Ambulatory Visit: Payer: Self-pay | Admitting: Family Medicine

## 2018-10-31 DIAGNOSIS — I10 Essential (primary) hypertension: Secondary | ICD-10-CM

## 2018-11-08 ENCOUNTER — Other Ambulatory Visit: Payer: Self-pay | Admitting: Family Medicine

## 2018-11-23 ENCOUNTER — Other Ambulatory Visit: Payer: Self-pay

## 2018-11-23 ENCOUNTER — Encounter (INDEPENDENT_AMBULATORY_CARE_PROVIDER_SITE_OTHER): Payer: Medicare Other | Admitting: Ophthalmology

## 2018-11-23 DIAGNOSIS — H35712 Central serous chorioretinopathy, left eye: Secondary | ICD-10-CM

## 2018-11-23 DIAGNOSIS — I10 Essential (primary) hypertension: Secondary | ICD-10-CM | POA: Diagnosis not present

## 2018-11-23 DIAGNOSIS — H35033 Hypertensive retinopathy, bilateral: Secondary | ICD-10-CM | POA: Diagnosis not present

## 2018-11-23 DIAGNOSIS — H353132 Nonexudative age-related macular degeneration, bilateral, intermediate dry stage: Secondary | ICD-10-CM

## 2018-11-23 DIAGNOSIS — H43813 Vitreous degeneration, bilateral: Secondary | ICD-10-CM

## 2019-01-21 ENCOUNTER — Other Ambulatory Visit: Payer: Self-pay | Admitting: Family Medicine

## 2019-01-21 NOTE — Telephone Encounter (Signed)
atenolol (TENORMIN) 50 MG tablet  [id number] [address], [place] AT [address] [place] [phone] (Phone) [phone] (Fax)   Please refill never use mail order!

## 2019-01-21 NOTE — Telephone Encounter (Signed)
Requested medication (s) are due for refill today: yes  Requested medication (s) are on the active medication list: yes  Last refill:  11/08/2018  Future visit scheduled: no  Notes to clinic:  Review for refill   Requested Prescriptions  Pending Prescriptions Disp Refills   atenolol (TENORMIN) 50 MG tablet 90 tablet 1    Sig: Take 1 tablet (50 mg total) by mouth daily.     Cardiovascular:  Beta Blockers Failed - 01/21/2019 11:28 AM      Failed - Valid encounter within last 6 months    Recent Outpatient Visits          9 months ago Well adult exam   [facility], Langley Adie, MD   1 year ago Cigarette nicotine dependence without complication   [facility], Langley Adie, MD   2 years ago Encounter for preventive health examination   [facility], Doretha Sou, MD   3 years ago Encounter for preventive health examination   [facility], Doretha Sou, MD   4 years ago Atherosclerosis of native coronary artery of native heart without angina pectoris   [place] [facility], Doretha Sou, MD      Future Appointments            In 2 months Volanda Napoleon, Langley Adie, MD [facility] [place], [place] BP in normal range    BP Readings from Last 1 Encounters:  04/13/18 120/70         Passed - Last Heart Rate in normal range    Pulse Readings from Last 1 Encounters:  04/13/18 74

## 2019-01-22 MED ORDER — ATENOLOL 50 MG PO TABS: 50.0000 mg | ORAL_TABLET | Freq: Every day | ORAL | 0 refills | Status: DC

## 2019-01-22 NOTE — Telephone Encounter (Signed)
Pt needs appointment for further refills 

## 2019-01-30 ENCOUNTER — Other Ambulatory Visit: Payer: Self-pay

## 2019-01-30 DIAGNOSIS — I10 Essential (primary) hypertension: Secondary | ICD-10-CM

## 2019-01-30 MED ORDER — OLMESARTAN MEDOXOMIL 40 MG PO TABS: ORAL_TABLET | ORAL | 0 refills | Status: DC

## 2019-02-23 ENCOUNTER — Encounter (INDEPENDENT_AMBULATORY_CARE_PROVIDER_SITE_OTHER): Payer: Medicare Other | Admitting: Ophthalmology

## 2019-02-23 DIAGNOSIS — I10 Essential (primary) hypertension: Secondary | ICD-10-CM

## 2019-02-23 DIAGNOSIS — H35712 Central serous chorioretinopathy, left eye: Secondary | ICD-10-CM | POA: Diagnosis not present

## 2019-02-23 DIAGNOSIS — H353132 Nonexudative age-related macular degeneration, bilateral, intermediate dry stage: Secondary | ICD-10-CM

## 2019-02-23 DIAGNOSIS — H43813 Vitreous degeneration, bilateral: Secondary | ICD-10-CM

## 2019-02-23 DIAGNOSIS — H35033 Hypertensive retinopathy, bilateral: Secondary | ICD-10-CM

## 2019-03-19 ENCOUNTER — Other Ambulatory Visit: Payer: Self-pay

## 2019-03-19 DIAGNOSIS — I10 Essential (primary) hypertension: Secondary | ICD-10-CM

## 2019-04-15 ENCOUNTER — Encounter: Payer: Self-pay | Admitting: Gastroenterology

## 2019-04-15 ENCOUNTER — Ambulatory Visit (INDEPENDENT_AMBULATORY_CARE_PROVIDER_SITE_OTHER): Payer: Medicare Other | Admitting: Family Medicine

## 2019-04-15 ENCOUNTER — Other Ambulatory Visit: Payer: Self-pay | Admitting: Family Medicine

## 2019-04-15 ENCOUNTER — Encounter: Payer: Self-pay | Admitting: Family Medicine

## 2019-04-15 ENCOUNTER — Other Ambulatory Visit: Payer: Self-pay

## 2019-04-15 VITALS — BP 118/76 | HR 82 | Temp 97.6°F | Wt 200.0 lb

## 2019-04-15 DIAGNOSIS — I10 Essential (primary) hypertension: Secondary | ICD-10-CM | POA: Diagnosis not present

## 2019-04-15 DIAGNOSIS — E782 Mixed hyperlipidemia: Secondary | ICD-10-CM | POA: Diagnosis not present

## 2019-04-15 DIAGNOSIS — Z1211 Encounter for screening for malignant neoplasm of colon: Secondary | ICD-10-CM

## 2019-04-15 DIAGNOSIS — N401 Enlarged prostate with lower urinary tract symptoms: Secondary | ICD-10-CM | POA: Diagnosis not present

## 2019-04-15 DIAGNOSIS — E783 Hyperchylomicronemia: Secondary | ICD-10-CM | POA: Diagnosis not present

## 2019-04-15 DIAGNOSIS — Z0000 Encounter for general adult medical examination without abnormal findings: Secondary | ICD-10-CM

## 2019-04-15 DIAGNOSIS — R011 Cardiac murmur, unspecified: Secondary | ICD-10-CM | POA: Diagnosis not present

## 2019-04-15 DIAGNOSIS — F17210 Nicotine dependence, cigarettes, uncomplicated: Secondary | ICD-10-CM | POA: Diagnosis not present

## 2019-04-15 DIAGNOSIS — R7303 Prediabetes: Secondary | ICD-10-CM | POA: Diagnosis not present

## 2019-04-15 LAB — BASIC METABOLIC PANEL
BUN: 11 mg/dL (ref 6–23)
CO2: 29 mEq/L (ref 19–32)
Calcium: 9.5 mg/dL (ref 8.4–10.5)
Chloride: 95 mEq/L — ABNORMAL LOW (ref 96–112)
Creatinine, Ser: 0.79 mg/dL (ref 0.40–1.50)
GFR: 98.26 mL/min (ref >60.00)
Glucose, Bld: 117 mg/dL — ABNORMAL HIGH (ref 70–99)
Potassium: 4.7 mEq/L (ref 3.5–5.1)
Sodium: 131 mEq/L — ABNORMAL LOW (ref 135–145)

## 2019-04-15 LAB — LIPID PANEL
Cholesterol: 153 mg/dL (ref 0–200)
HDL: 55.9 mg/dL (ref >39.00)
LDL Cholesterol: 84 mg/dL (ref 0–99)
NonHDL: 96.98
Total CHOL/HDL Ratio: 3
Triglycerides: 63 mg/dL (ref 0.0–149.0)
VLDL: 12.6 mg/dL (ref 0.0–40.0)

## 2019-04-15 LAB — CBC WITH DIFFERENTIAL/PLATELET
Basophils Absolute: 0.1 10*3/uL (ref 0.0–0.1)
Basophils Relative: 0.6 % (ref 0.0–3.0)
Eosinophils Absolute: 0.2 10*3/uL (ref 0.0–0.7)
Eosinophils Relative: 2 % (ref 0.0–5.0)
HCT: 44.9 % (ref 39.0–52.0)
Hemoglobin: 15.4 g/dL (ref 13.0–17.0)
Lymphocytes Relative: 17.2 % (ref 12.0–46.0)
Lymphs Abs: 1.5 10*3/uL (ref 0.7–4.0)
MCHC: 34.3 g/dL (ref 30.0–36.0)
MCV: 92.5 fl (ref 78.0–100.0)
Monocytes Absolute: 0.8 10*3/uL (ref 0.1–1.0)
Monocytes Relative: 9.4 % (ref 3.0–12.0)
Neutro Abs: 6.3 10*3/uL (ref 1.4–7.7)
Neutrophils Relative %: 70.8 % (ref 43.0–77.0)
Platelets: 238 10*3/uL (ref 150.0–400.0)
RBC: 4.85 Mil/uL (ref 4.22–5.81)
RDW: 13.8 % (ref 11.5–15.5)
WBC: 8.9 10*3/uL (ref 4.0–10.5)

## 2019-04-15 LAB — HEMOGLOBIN A1C: Hgb A1c MFr Bld: 5.7 % (ref 4.6–6.5)

## 2019-04-15 LAB — PSA: PSA: 0.96 ng/mL (ref 0.10–4.00)

## 2019-04-15 MED ORDER — OLMESARTAN MEDOXOMIL 40 MG PO TABS: ORAL_TABLET | ORAL | 0 refills | Status: DC

## 2019-04-15 MED ORDER — ATENOLOL 50 MG PO TABS: 50.0000 mg | ORAL_TABLET | Freq: Every day | ORAL | 0 refills | Status: DC

## 2019-04-15 NOTE — Progress Notes (Signed)
Subjective:     Brandon [name] is a 63 y.o. male and is here for a comprehensive physical exam. The patient reports no problems.  Pt states he has been doing well he recently moved into a new townhome.  Pt is smoking less than half a pack per day.  Walking for exercise and eating smaller portions.  Pt followed by Dr. Trudie Reed, rheumatology for ankylosing spondylitis and other autoimmune concerns.  Patient is no longer on Enbrel.  Now on Remicade infusions every 6 weeks.  Dr. Trudie Reed referred pt to Dr. Manuella Ghazi at [facility] for iritis.  Pt denies issues with hearing vision, feeling down, or having any falls within the last year.  Pt had influenza vaccine and pneumococcal vaccine at the pharmacy a few months ago.  Social History   Socioeconomic History  . Marital status: Single    Spouse name: Not on file  . Number of children: Not on file  . Years of education: Not on file  . Highest education level: Not on file  Occupational History  . Occupation: Best boy: [employer]  . Financial resource strain: Not on file  . Food insecurity    Worry: Not on file    Inability: Not on file  . Transportation needs    Medical: Not on file    Non-medical: Not on file  Tobacco Use  . Smoking status: Current Every Day Smoker    Packs/day: 0.50    Years: 40.00    Pack years: 20.00    Types: Cigarettes  . Smokeless tobacco: Never Used  Substance and Sexual Activity  . Alcohol use: Yes    Comment: daily  . Drug use: No  . Sexual activity: Not on file  Lifestyle  . Physical activity    Days per week: Not on file    Minutes per session: Not on file  . Stress: Not on file  Relationships  . Social Herbalist on phone: Not on file    Gets together: Not on file    Attends religious service: Not on file    Active member of club or organization: Not on file    Attends meetings of clubs or organizations: Not on file    Relationship status: Not on file  . Intimate  partner violence    Fear of current or ex partner: Not on file    Emotionally abused: Not on file    Physically abused: Not on file    Forced sexual activity: Not on file  Other Topics Concern  . Not on file  Social History Narrative  . Not on file   Health Maintenance  Topic Date Due  . Hepatitis C Screening  [birth year]  . HIV Screening  09/14/1970  . COLONOSCOPY  01/05/2017  . PNA vac Low Risk Adult (2 of 2 - PPSV23) 01/11/2020  . TETANUS/TDAP  09/11/2021  . INFLUENZA VACCINE  Completed    The following portions of the patient's history were reviewed and updated as appropriate: allergies, current medications, past family history, past medical history, past social history, past surgical history and problem list.  Review of Systems Pertinent items noted in HPI and remainder of comprehensive ROS otherwise negative.   Objective:    BP 118/76 (BP Location: Left Arm, Patient Position: Sitting, Cuff Size: Normal)   Pulse 82   Temp 97.6 F (36.4 C) (Temporal)   Wt 200 lb (90.7 kg)   SpO2 98%  BMI 28.70 kg/m  General appearance: alert, cooperative and no distress Head: Normocephalic, without obvious abnormality, atraumatic Eyes: conjunctivae/corneas clear. PERRL, EOM's intact. Fundi benign. Ears: normal TM's and external ear canals both ears Nose: Nares normal. Septum midline. Mucosa normal. No drainage or sinus tenderness. Throat: lips, mucosa, and tongue normal; teeth and gums normal Neck: no adenopathy, no carotid bruit, no JVD, supple, symmetrical, trachea midline and thyroid not enlarged, symmetric, no tenderness/mass/nodules Lungs: clear to auscultation bilaterally Heart: regular rate and rhythm, 2/6 murmur, no click, rub or gallop Abdomen: soft, non-tender; bowel sounds normal; no masses,  no organomegaly Extremities: extremities normal, atraumatic, no cyanosis or edema  GU: deferred.  Will obtain PSA first Pulses: 2+ and symmetric Skin: Skin color, texture, turgor  normal. No rashes or lesions Lymph nodes: Cervical, supraclavicular, and axillary nodes normal. Neurologic: Alert and oriented X 3, normal strength and tone. Normal symmetric reflexes. Normal coordination and gait    Assessment:    Healthy male exam.     Plan:     Anticipatory guidance given including wearing seatbelts, smoke detectors in the home, increasing physical activity, increasing p.o. intake of water and vegetables. -influenza and pneumonia vaccine up to date, done at pharmacy. -colonoscopy completed in 2015 -will obtain labs -given handout -next CPE in 1 yr See After Visit Summary for Counseling Recommendations    Essential hypertension -controlled  -Discussed lifestyle modification -continue olmesartan 40 mg and atenolol 50 mg  - Plan: CBC with Differential/Platelet, Basic Metabolic Panel  Heart murmur -Stable -Likely 2/2 mild aortic stenosis -We will continue to monitor  Benign prostatic hyperplasia with lower urinary tract symptoms, symptom details unspecified  -Continue Flomax 0.4 mg - Plan: PSA  Prediabetes -Hemoglobin A1c 5.9% on 04/13/2018 -Discussed lifestyle modifications -Given handout - Plan: Hemoglobin A1c  Cigarette nicotine dependence without complication -Smoking cessation counseling greater than 3 minutes, less than 10 minutes -Patient will continue working on cutting down -Consider restarting Chantix -We will reevaluate at each visit -Given handout  Mixed hyperlipidemia -Controlled per last lipid panel 04/13/2018 -Continue Lipitor 80 mg -Discussed lifestyle modification - Plan: Lipid Panel  Follow-up in 1 year for CPE, as needed for other conditions  Grier Mitts, MD

## 2019-04-15 NOTE — Patient Instructions (Addendum)
Preventive Care 65 Years and Older, Male Preventive care refers to lifestyle choices and visits with your health care provider that can promote health and wellness. This includes:  A yearly physical exam. This is also called an annual well check.  Regular dental and eye exams.  Immunizations.  Screening for certain conditions.  Healthy lifestyle choices, such as diet and exercise. What can I expect for my preventive care visit? Physical exam Your health care provider will check:  Height and weight. These may be used to calculate body mass index (BMI), which is a measurement that tells if you are at a healthy weight.  Heart rate and blood pressure.  Your skin for abnormal spots. Counseling Your health care provider may ask you questions about:  Alcohol, tobacco, and drug use.  Emotional well-being.  Home and relationship well-being.  Sexual activity.  Eating habits.  History of falls.  Memory and ability to understand (cognition).  Work and work Statistician. What immunizations do I need?  Influenza (flu) vaccine  This is recommended every year. Tetanus, diphtheria, and pertussis (Tdap) vaccine  You may need a Td booster every 10 years. Varicella (chickenpox) vaccine  You may need this vaccine if you have not already been vaccinated. Zoster (shingles) vaccine  You may need this after age 50. Pneumococcal conjugate (PCV13) vaccine  One dose is recommended after age 24. Pneumococcal polysaccharide (PPSV23) vaccine  One dose is recommended after age 33. Measles, mumps, and rubella (MMR) vaccine  You may need at least one dose of MMR if you were born in 1957 or later. You may also need a second dose. Meningococcal conjugate (MenACWY) vaccine  You may need this if you have certain conditions. Hepatitis A vaccine  You may need this if you have certain conditions or if you travel or work in places where you may be exposed to hepatitis A. Hepatitis B vaccine   You may need this if you have certain conditions or if you travel or work in places where you may be exposed to hepatitis B. Haemophilus influenzae type b (Hib) vaccine  You may need this if you have certain conditions. You may receive vaccines as individual doses or as more than one vaccine together in one shot (combination vaccines). Talk with your health care provider about the risks and benefits of combination vaccines. What tests do I need? Blood tests  Lipid and cholesterol levels. These may be checked every 5 years, or more frequently depending on your overall health.  Hepatitis C test.  Hepatitis B test. Screening  Lung cancer screening. You may have this screening every year starting at age 65 if you have a 30-pack-year history of smoking and currently smoke or have quit within the past 15 years.  Colorectal cancer screening. All adults should have this screening starting at age 65 and continuing until age 54. Your health care provider may recommend screening at age 65 if you are at increased risk. You will have tests every 1-10 years, depending on your results and the type of screening test.  Prostate cancer screening. Recommendations will vary depending on your family history and other risks.  Diabetes screening. This is done by checking your blood sugar (glucose) after you have not eaten for a while (fasting). You may have this done every 1-3 years.  Abdominal aortic aneurysm (AAA) screening. You may need this if you are a current or former smoker.  Sexually transmitted disease (STD) testing. Follow these instructions at home: Eating and drinking  Eat  a diet that includes fresh fruits and vegetables, whole grains, lean protein, and low-fat dairy products. Limit your intake of foods with high amounts of sugar, saturated fats, and salt.  Take vitamin and mineral supplements as recommended by your health care provider.  Do not drink alcohol if your health care provider  tells you not to drink.  If you drink alcohol: ? Limit how much you have to 0-2 drinks a day. ? Be aware of how much alcohol is in your drink. In the [place] one drink equals one 12 oz bottle of beer (355 mL), one 5 oz glass of wine (148 mL), or one 1 oz glass of hard liquor (44 mL). Lifestyle  Take daily care of your teeth and gums.  Stay active. Exercise for at least 30 minutes on 5 or more days each week.  Do not use any products that contain nicotine or tobacco, such as cigarettes, e-cigarettes, and chewing tobacco. If you need help quitting, ask your health care provider.  If you are sexually active, practice safe sex. Use a condom or other form of protection to prevent STIs (sexually transmitted infections).  Talk with your health care provider about taking a low-dose aspirin or statin. What's next?  Visit your health care provider once a year for a well check visit.  Ask your health care provider how often you should have your eyes and teeth checked.  Stay up to date on all vaccines. This information is not intended to replace advice given to you by your health care provider. Make sure you discuss any questions you have with your health care provider. Document Released: 05/26/2015 Document Revised: 04/23/2018 Document Reviewed: 04/23/2018 Elsevier Patient Education  2020 [employer].  Steps to Quit Smoking Smoking tobacco is the leading cause of preventable death. It can affect almost every organ in the body. Smoking puts you and those around you at risk for developing many serious chronic diseases. Quitting smoking can be difficult, but it is one of the best things that you can do for your health. It is never too late to quit. How do I get ready to quit? When you decide to quit smoking, create a plan to help you succeed. Before you quit:  Pick a date to quit. Set a date within the next 2 weeks to give you time to prepare.  Write down the reasons why you are quitting. Keep  this list in places where you will see it often.  Tell your family, friends, and co-workers that you are quitting. Support from your loved ones can make quitting easier.  Talk with your health care provider about your options for quitting smoking.  Find out what treatment options are covered by your health insurance.  Identify people, places, things, and activities that make you want to smoke (triggers). Avoid them. What first steps can I take to quit smoking?  Throw away all cigarettes at home, at work, and in your car.  Throw away smoking accessories, such as Scientist, research (medical).  Clean your car. Make sure to empty the ashtray.  Clean your home, including curtains and carpets. What strategies can I use to quit smoking? Talk with your health care provider about combining strategies, such as taking medicines while you are also receiving in-person counseling. Using these two strategies together makes you more likely to succeed in quitting than if you used either strategy on its own.  If you are pregnant or breastfeeding, talk with your health care provider about finding counseling  or other support strategies to quit smoking. Do not take medicine to help you quit smoking unless your health care provider tells you to do so. To quit smoking: Quit right away  Quit smoking completely, instead of gradually reducing how much you smoke over a period of time. Research shows that stopping smoking right away is more successful than gradually quitting.  Attend in-person counseling to help you build problem-solving skills. You are more likely to succeed in quitting if you attend counseling sessions regularly. Even short sessions of 10 minutes can be effective. Take medicine You may take medicines to help you quit smoking. Some medicines require a prescription and some you can purchase over-the-counter. Medicines may have nicotine in them to replace the nicotine in cigarettes. Medicines may:  Help  to stop cravings.  Help to relieve withdrawal symptoms. Your health care provider may recommend:  Nicotine patches, gum, or lozenges.  Nicotine inhalers or sprays.  Non-nicotine medicine that is taken by mouth. Find resources Find resources and support systems that can help you to quit smoking and remain smoke-free after you quit. These resources are most helpful when you use them often. They include:  Online chats with a Social worker.  Telephone quitlines.  Printed Furniture conservator/restorer.  Support groups or group counseling.  Text messaging programs.  Mobile phone apps or applications. Use apps that can help you stick to your quit plan by providing reminders, tips, and encouragement. There are many free apps for mobile devices as well as websites. Examples include Quit Guide from the [employer] and smokefree.gov What things can I do to make it easier to quit?   Reach out to your family and friends for support and encouragement. Call telephone quitlines (1-800-QUIT-NOW), reach out to support groups, or work with a counselor for support.  Ask people who smoke to avoid smoking around you.  Avoid places that trigger you to smoke, such as bars, parties, or smoke-break areas at work.  Spend time with people who do not smoke.  Lessen the stress in your life. Stress can be a smoking trigger for some people. To lessen stress, try: ? Exercising regularly. ? Doing deep-breathing exercises. ? Doing yoga. ? Meditating. ? Performing a body scan. This involves closing your eyes, scanning your body from head to toe, and noticing which parts of your body are particularly tense. Try to relax the muscles in those areas. How will I feel when I quit smoking? Day 1 to 3 weeks Within the first 24 hours of quitting smoking, you may start to feel withdrawal symptoms. These symptoms are usually most noticeable 2-3 days after quitting, but they usually do not last for more than 2-3 weeks. You may experience  these symptoms:  Mood swings.  Restlessness, anxiety, or irritability.  Trouble concentrating.  Dizziness.  Strong cravings for sugary foods and nicotine.  Mild weight gain.  Constipation.  Nausea.  Coughing or a sore throat.  Changes in how the medicines that you take for unrelated issues work in your body.  Depression.  Trouble sleeping (insomnia). Week 3 and afterward After the first 2-3 weeks of quitting, you may start to notice more positive results, such as:  Improved sense of smell and taste.  Decreased coughing and sore throat.  Slower heart rate.  Lower blood pressure.  Clearer skin.  The ability to breathe more easily.  Fewer sick days. Quitting smoking can be very challenging. Do not get discouraged if you are not successful the first time. Some people need to  make many attempts to quit before they achieve long-term success. Do your best to stick to your quit plan, and talk with your health care provider if you have any questions or concerns. Summary  Smoking tobacco is the leading cause of preventable death. Quitting smoking is one of the best things that you can do for your health.  When you decide to quit smoking, create a plan to help you succeed.  Quit smoking right away, not slowly over a period of time.  When you start quitting, seek help from your health care provider, family, or friends. This information is not intended to replace advice given to you by your health care provider. Make sure you discuss any questions you have with your health care provider. Document Released: 04/23/2001 Document Revised: 07/17/2018 Document Reviewed: 07/18/2018 Elsevier Patient Education  [place].  Prediabetes Prediabetes is the condition of having a blood sugar (blood glucose) level that is higher than it should be, but not high enough for you to be diagnosed with type 2 diabetes. Having prediabetes puts you at risk for developing type 2 diabetes  (type 2 diabetes mellitus). Prediabetes may be called impaired glucose tolerance or impaired fasting glucose. Prediabetes usually does not cause symptoms. Your health care provider can diagnose this condition with blood tests. You may be tested for prediabetes if you are overweight and if you have at least one other risk factor for prediabetes. What is blood glucose, and how is it measured? Blood glucose refers to the amount of glucose in your bloodstream. Glucose comes from eating foods that contain sugars and starches (carbohydrates), which the body breaks down into glucose. Your blood glucose level may be measured in mg/dL (milligrams per deciliter) or mmol/L (millimoles per liter). Your blood glucose may be checked with one or more of the following blood tests:  A fasting blood glucose (FBG) test. You will not be allowed to eat (you will fast) for 8 hours or longer before a blood sample is taken. ? A normal range for FBG is 70-100 mg/dl (3.9-5.6 mmol/L).  An A1c (hemoglobin A1c) blood test. This test provides information about blood glucose control over the previous 2?2month.  An oral glucose tolerance test (OGTT). This test measures your blood glucose at two times: ? After fasting. This is your baseline level. ? Two hours after you drink a beverage that contains glucose. You may be diagnosed with prediabetes:  If your FBG is 100?125 mg/dL (5.6-6.9 mmol/L).  If your A1c level is 5.7?6.4%.  If your OGTT result is 140?199 mg/dL (7.8-11 mmol/L). These blood tests may be repeated to confirm your diagnosis. How can this condition affect me? The pancreas produces a hormone (insulin) that helps to move glucose from the bloodstream into cells. When cells in the body do not respond properly to insulin that the body makes (insulin resistance), excess glucose builds up in the blood instead of going into cells. As a result, high blood glucose (hyperglycemia) can develop, which can cause many  complications. Hyperglycemia is a symptom of prediabetes. Having high blood glucose for a long time is dangerous. Too much glucose in your blood can damage your nerves and blood vessels. Long-term damage can lead to complications from diabetes, which may include:  Heart disease.  Stroke.  Blindness.  Kidney disease.  Depression.  Poor circulation in the feet and legs, which could lead to surgical removal (amputation) in severe cases. What can increase my risk? Risk factors for prediabetes include:  Having a family  member with type 2 diabetes.  Being overweight or obese.  Being older than age 37.  Being of American [place], African-American, Hispanic/Latino, or Asian/Pacific Islander descent.  Having an inactive (sedentary) lifestyle.  Having a history of heart disease.  History of gestational diabetes or polycystic ovary syndrome (PCOS), in women.  Having low levels of good cholesterol (HDL-C) or high levels of blood fats (triglycerides).  Having high blood pressure. What actions can I take to prevent diabetes?      Be physically active. ? Do moderate-intensity physical activity for 30 or more minutes on 5 or more days of the week, or as much as told by your health care provider. This could be brisk walking, biking, or water aerobics. ? Ask your health care provider what activities are safe for you. A mix of physical activities may be best, such as walking, swimming, cycling, and strength training.  Lose weight as told by your health care provider. ? Losing 5-7% of your body weight can reverse insulin resistance. ? Your health care provider can determine how much weight loss is best for you and can help you lose weight safely.  Follow a healthy meal plan. This includes eating lean proteins, complex carbohydrates, fresh fruits and vegetables, low-fat dairy products, and healthy fats. ? Follow instructions from your health care provider about eating or drinking  restrictions. ? Make an appointment to see a diet and nutrition specialist (registered dietitian) to help you create a healthy eating plan that is right for you.  Do not smoke or use any tobacco products, such as cigarettes, chewing tobacco, and e-cigarettes. If you need help quitting, ask your health care provider.  Take over-the-counter and prescription medicines as told by your health care provider. You may be prescribed medicines that help lower the risk of type 2 diabetes.  Keep all follow-up visits as told by your health care provider. This is important. Summary  Prediabetes is the condition of having a blood sugar (blood glucose) level that is higher than it should be, but not high enough for you to be diagnosed with type 2 diabetes.  Having prediabetes puts you at risk for developing type 2 diabetes (type 2 diabetes mellitus).  To help prevent type 2 diabetes, make lifestyle changes such as being physically active and eating a healthy diet. Lose weight as told by your health care provider. This information is not intended to replace advice given to you by your health care provider. Make sure you discuss any questions you have with your health care provider. Document Released: 08/21/2015 Document Revised: 08/21/2018 Document Reviewed: 06/20/2015 Elsevier Patient Education  [place] A heart murmur is an extra sound that is caused by chaotic blood flow through the valves of the heart. The murmur can be heard as a "hum" or "whoosh" sound when blood flows through the heart. There are two types of heart murmurs:  Innocent (benign) murmurs. Most people with this type of heart murmur do not have a heart problem. Many children have innocent heart murmurs. Your health care provider may suggest some basic tests to find out whether your murmur is an innocent murmur. If an innocent heart murmur is found, there is no need for further tests or treatment and no need to  restrict activities or stop playing sports.  Abnormal murmurs. These types of murmurs can occur in children and adults. Abnormal murmurs may be a sign of a more serious heart condition, such as a heart defect present at  birth (congenital defect) or heart valve disease. What are the causes?  The heart has four areas called chambers. Valves separate the upper and lower chambers from each other (tricuspid valve and mitral valve) and separate the lower chambers of the heart from pathways that lead away from the heart (aortic valve and pulmonary valve). Normally, the valves open to let blood flow through or out of your heart, and then they shut to keep the blood from flowing backward. This condition is caused by heart valves that are not working properly.  In children, abnormal heart murmurs are typically caused by congenital defects.  In adults, abnormal murmurs are usually caused by heart valve problems from disease, infection, or aging. This condition may also be caused by:  Pregnancy.  Fever.  Overactive thyroid gland.  Anemia.  Exercise.  Rapid growth spurts (in children). What are the signs or symptoms? Innocent murmurs do not cause symptoms, and many people with abnormal murmurs may not have symptoms. If symptoms do develop, they may include:  Shortness of breath.  Blue coloring of the skin, especially on the fingertips.  Chest pain.  Palpitations, or feeling a fluttering or skipped heartbeat.  Fainting.  Persistent cough.  Getting tired much faster than expected.  Swelling in the abdomen, feet, or ankles. How is this diagnosed? This condition may be diagnosed during a routine physical or other exam. If your health care provider hears a murmur with a stethoscope, he or she will listen for:  Where the murmur is located in your heart.  How long the murmur lasts (duration).  When the murmur is heard during the heartbeat.  How loud the murmur is. This may help the  health care provider figure out what is causing the murmur. You may be referred to a heart specialist (cardiologist). You may also have other tests, including:  Electrocardiogram (ECG or EKG). This test measures the electrical activity of your heart.  Echocardiogram. This test uses high frequency sound waves to make pictures of your heart.  MRI or chest X-ray.  Cardiac catheterization. This test looks at blood flow through the arteries around the heart. For children and adults who have an abnormal heart murmur and want to stay active, it is important to:  Complete testing.  Review test results.  Receive recommendations from your health care provider. If heart disease is present, it may not be safe to play or be active. How is this treated? Heart murmurs themselves do not need treatment. In some cases, a heart murmur may go away on its own. If an underlying problem or disease is causing the murmur, you may need treatment. If treatment is needed, it will depend on the type and severity of the disease or heart problem causing the murmur. Treatment may include:  Medicine.  Surgery.  Dietary and lifestyle changes. Follow these instructions at home:  Talk with your health care provider before participating in sports or other activities that require a lot of effort and energy (are strenuous).  Learn as much as possible about your condition and any related diseases. Ask your health care provider if you may be at risk for any medical emergencies.  Talk with your health care provider about what symptoms you should look out for.  It is up to you to get your test results. Ask your health care provider, or the department that is doing the test, when your results will be ready.  Keep all follow-up visits as told by your health care provider. This is  important. Contact a health care provider if:  You are frequently short of breath.  You feel more tired than usual.  You are having a hard  time keeping up with normal activities or fitness routines.  You have swelling in your ankles or feet.  You notice that your heart often beats irregularly.  You develop any new symptoms. Get help right away if:  You have chest pain.  You are having trouble breathing.  You feel light-headed or you pass out.  Your symptoms suddenly get worse. These symptoms may represent a serious problem that is an emergency. Do not wait to see if the symptoms will go away. Get medical help right away. Call your local emergency services (911 in the [place] Do not drive yourself to the hospital. Summary  Normally, the heart valves open to let blood flow through or out of your heart, and then they shut to keep the blood from flowing backward.  A heart murmur is caused by heart valves that are not working properly.  You may need treatment if an underlying problem or disease is causing the heart murmur. Treatment may include medicine, surgery, or dietary and lifestyle changes.  Talk with your health care provider before participating in sports or other activities that require a lot of effort and energy (are strenuous).  Talk with your health care provider about what symptoms you should watch out for. This information is not intended to replace advice given to you by your health care provider. Make sure you discuss any questions you have with your health care provider. Document Released: 06/06/2004 Document Revised: 10/21/2017 Document Reviewed: 10/21/2017 Elsevier Patient Education  2020 [employer].

## 2019-04-16 ENCOUNTER — Encounter: Payer: Self-pay | Admitting: Family Medicine

## 2019-04-20 ENCOUNTER — Other Ambulatory Visit: Payer: Self-pay | Admitting: Family Medicine

## 2019-04-27 ENCOUNTER — Encounter (HOSPITAL_COMMUNITY): Payer: Self-pay | Admitting: Physician Assistant

## 2019-04-27 ENCOUNTER — Inpatient Hospital Stay (HOSPITAL_COMMUNITY)
Admission: EM | Admit: 2019-04-27 | Discharge: 2019-05-04 | DRG: 246 | Disposition: A | Discharge status: Home / Self Care | Payer: Medicare Other | Attending: Interventional Cardiology | Admitting: Interventional Cardiology

## 2019-04-27 ENCOUNTER — Other Ambulatory Visit: Payer: Self-pay

## 2019-04-27 ENCOUNTER — Inpatient Hospital Stay (HOSPITAL_COMMUNITY)
Admission: EM | Disposition: A | Discharge status: Home / Self Care | Payer: Self-pay | Source: Home / Self Care | Attending: Interventional Cardiology

## 2019-04-27 ENCOUNTER — Ambulatory Visit (HOSPITAL_COMMUNITY): Admit: 2019-04-27 | Payer: Medicare Other | Admitting: Interventional Cardiology

## 2019-04-27 ENCOUNTER — Encounter (HOSPITAL_COMMUNITY)
Admission: EM | Disposition: A | Discharge status: Home / Self Care | Payer: Self-pay | Source: Home / Self Care | Attending: Interventional Cardiology

## 2019-04-27 ENCOUNTER — Inpatient Hospital Stay (HOSPITAL_COMMUNITY): Payer: Medicare Other

## 2019-04-27 DIAGNOSIS — H35719 Central serous chorioretinopathy, unspecified eye: Secondary | ICD-10-CM | POA: Diagnosis present

## 2019-04-27 DIAGNOSIS — F17200 Nicotine dependence, unspecified, uncomplicated: Secondary | ICD-10-CM | POA: Diagnosis not present

## 2019-04-27 DIAGNOSIS — R570 Cardiogenic shock: Secondary | ICD-10-CM | POA: Diagnosis not present

## 2019-04-27 DIAGNOSIS — I2510 Atherosclerotic heart disease of native coronary artery without angina pectoris: Secondary | ICD-10-CM | POA: Diagnosis present

## 2019-04-27 DIAGNOSIS — Z8249 Family history of ischemic heart disease and other diseases of the circulatory system: Secondary | ICD-10-CM | POA: Diagnosis not present

## 2019-04-27 DIAGNOSIS — I5021 Acute systolic (congestive) heart failure: Secondary | ICD-10-CM | POA: Diagnosis present

## 2019-04-27 DIAGNOSIS — N400 Enlarged prostate without lower urinary tract symptoms: Secondary | ICD-10-CM | POA: Diagnosis present

## 2019-04-27 DIAGNOSIS — E876 Hypokalemia: Secondary | ICD-10-CM | POA: Diagnosis not present

## 2019-04-27 DIAGNOSIS — F329 Major depressive disorder, single episode, unspecified: Secondary | ICD-10-CM | POA: Diagnosis not present

## 2019-04-27 DIAGNOSIS — E785 Hyperlipidemia, unspecified: Secondary | ICD-10-CM | POA: Diagnosis present

## 2019-04-27 DIAGNOSIS — K449 Diaphragmatic hernia without obstruction or gangrene: Secondary | ICD-10-CM | POA: Diagnosis present

## 2019-04-27 DIAGNOSIS — I2102 ST elevation (STEMI) myocardial infarction involving left anterior descending coronary artery: Secondary | ICD-10-CM | POA: Diagnosis not present

## 2019-04-27 DIAGNOSIS — R079 Chest pain, unspecified: Secondary | ICD-10-CM

## 2019-04-27 DIAGNOSIS — M459 Ankylosing spondylitis of unspecified sites in spine: Secondary | ICD-10-CM | POA: Diagnosis present

## 2019-04-27 DIAGNOSIS — Z8261 Family history of arthritis: Secondary | ICD-10-CM

## 2019-04-27 DIAGNOSIS — I10 Essential (primary) hypertension: Secondary | ICD-10-CM

## 2019-04-27 DIAGNOSIS — H209 Unspecified iridocyclitis: Secondary | ICD-10-CM | POA: Diagnosis present

## 2019-04-27 DIAGNOSIS — H359 Unspecified retinal disorder: Secondary | ICD-10-CM | POA: Diagnosis present

## 2019-04-27 DIAGNOSIS — I252 Old myocardial infarction: Secondary | ICD-10-CM

## 2019-04-27 DIAGNOSIS — I2109 ST elevation (STEMI) myocardial infarction involving other coronary artery of anterior wall: Principal | ICD-10-CM | POA: Diagnosis present

## 2019-04-27 DIAGNOSIS — I213 ST elevation (STEMI) myocardial infarction of unspecified site: Secondary | ICD-10-CM

## 2019-04-27 DIAGNOSIS — F17210 Nicotine dependence, cigarettes, uncomplicated: Secondary | ICD-10-CM | POA: Diagnosis present

## 2019-04-27 DIAGNOSIS — R0603 Acute respiratory distress: Secondary | ICD-10-CM

## 2019-04-27 DIAGNOSIS — Z955 Presence of coronary angioplasty implant and graft: Secondary | ICD-10-CM | POA: Diagnosis not present

## 2019-04-27 DIAGNOSIS — Z20828 Contact with and (suspected) exposure to other viral communicable diseases: Secondary | ICD-10-CM | POA: Diagnosis present

## 2019-04-27 DIAGNOSIS — M1990 Unspecified osteoarthritis, unspecified site: Secondary | ICD-10-CM | POA: Diagnosis present

## 2019-04-27 DIAGNOSIS — Z818 Family history of other mental and behavioral disorders: Secondary | ICD-10-CM | POA: Diagnosis not present

## 2019-04-27 DIAGNOSIS — I110 Hypertensive heart disease with heart failure: Secondary | ICD-10-CM | POA: Diagnosis present

## 2019-04-27 DIAGNOSIS — R0789 Other chest pain: Secondary | ICD-10-CM | POA: Diagnosis present

## 2019-04-27 HISTORY — PX: CORONARY/GRAFT ACUTE MI REVASCULARIZATION: CATH118305

## 2019-04-27 HISTORY — DX: ST elevation (STEMI) myocardial infarction of unspecified site: I21.3

## 2019-04-27 HISTORY — PX: RIGHT HEART CATH: CATH118263

## 2019-04-27 HISTORY — PX: LEFT HEART CATH AND CORONARY ANGIOGRAPHY: CATH118249

## 2019-04-27 LAB — COMPREHENSIVE METABOLIC PANEL
ALT: 25 U/L (ref 0–44)
ALT: 103 U/L — ABNORMAL HIGH (ref 0–44)
AST: 25 U/L (ref 15–41)
AST: 480 U/L — ABNORMAL HIGH (ref 15–41)
Albumin: 3.7 g/dL (ref 3.5–5.0)
Albumin: 3.6 g/dL (ref 3.5–5.0)
Alkaline Phosphatase: 88 U/L (ref 38–126)
Alkaline Phosphatase: 91 U/L (ref 38–126)
Anion gap: 13 (ref 5–15)
Anion gap: 13 (ref 5–15)
BUN: 12 mg/dL (ref 8–23)
BUN: 15 mg/dL (ref 8–23)
CO2: 18 mmol/L — ABNORMAL LOW (ref 22–32)
CO2: 19 mmol/L — ABNORMAL LOW (ref 22–32)
Calcium: 9.2 mg/dL (ref 8.9–10.3)
Calcium: 9.2 mg/dL (ref 8.9–10.3)
Chloride: 99 mmol/L (ref 98–111)
Chloride: 100 mmol/L (ref 98–111)
Creatinine, Ser: 1.14 mg/dL (ref 0.61–1.24)
Creatinine, Ser: 0.99 mg/dL (ref 0.61–1.24)
GFR calc Af Amer: >60 mL/min (ref >60)
GFR calc Af Amer: >60 mL/min (ref >60)
GFR calc non Af Amer: >60 mL/min (ref >60)
GFR calc non Af Amer: >60 mL/min (ref >60)
Glucose, Bld: 192 mg/dL — ABNORMAL HIGH (ref 70–99)
Glucose, Bld: 132 mg/dL — ABNORMAL HIGH (ref 70–99)
Potassium: 3.7 mmol/L (ref 3.5–5.1)
Potassium: 4.4 mmol/L (ref 3.5–5.1)
Sodium: 130 mmol/L — ABNORMAL LOW (ref 135–145)
Sodium: 132 mmol/L — ABNORMAL LOW (ref 135–145)
Total Bilirubin: 0.6 mg/dL (ref 0.3–1.2)
Total Bilirubin: 1.2 mg/dL (ref 0.3–1.2)
Total Protein: 6.9 g/dL (ref 6.5–8.1)
Total Protein: 7 g/dL (ref 6.5–8.1)

## 2019-04-27 LAB — LIPID PANEL
Cholesterol: 154 mg/dL (ref 0–200)
HDL: 59 mg/dL (ref >40)
LDL Cholesterol: 79 mg/dL (ref 0–99)
Total CHOL/HDL Ratio: 2.6 RATIO
Triglycerides: 81 mg/dL (ref <150)
VLDL: 16 mg/dL (ref 0–40)

## 2019-04-27 LAB — BASIC METABOLIC PANEL
Anion gap: 10 (ref 5–15)
BUN: 15 mg/dL (ref 8–23)
CO2: 20 mmol/L — ABNORMAL LOW (ref 22–32)
Calcium: 9 mg/dL (ref 8.9–10.3)
Chloride: 99 mmol/L (ref 98–111)
Creatinine, Ser: 0.95 mg/dL (ref 0.61–1.24)
GFR calc Af Amer: >60 mL/min (ref >60)
GFR calc non Af Amer: >60 mL/min (ref >60)
Glucose, Bld: 148 mg/dL — ABNORMAL HIGH (ref 70–99)
Potassium: 3.9 mmol/L (ref 3.5–5.1)
Sodium: 129 mmol/L — ABNORMAL LOW (ref 135–145)

## 2019-04-27 LAB — CBC WITH DIFFERENTIAL/PLATELET
Abs Immature Granulocytes: 0.07 10*3/uL (ref 0.00–0.07)
Basophils Absolute: 0 10*3/uL (ref 0.0–0.1)
Basophils Relative: 0 %
Eosinophils Absolute: 0 10*3/uL (ref 0.0–0.5)
Eosinophils Relative: 0 %
HCT: 45.5 % (ref 39.0–52.0)
Hemoglobin: 16 g/dL (ref 13.0–17.0)
Immature Granulocytes: 0 %
Lymphocytes Relative: 5 %
Lymphs Abs: 0.8 10*3/uL (ref 0.7–4.0)
MCH: 31.5 pg (ref 26.0–34.0)
MCHC: 35.2 g/dL (ref 30.0–36.0)
MCV: 89.6 fL (ref 80.0–100.0)
Monocytes Absolute: 1.2 10*3/uL — ABNORMAL HIGH (ref 0.1–1.0)
Monocytes Relative: 7 %
Neutro Abs: 14.6 10*3/uL — ABNORMAL HIGH (ref 1.7–7.7)
Neutrophils Relative %: 88 %
Platelets: 251 10*3/uL (ref 150–400)
RBC: 5.08 MIL/uL (ref 4.22–5.81)
RDW: 13.2 % (ref 11.5–15.5)
WBC: 16.7 10*3/uL — ABNORMAL HIGH (ref 4.0–10.5)
nRBC: 0 % (ref 0.0–0.2)

## 2019-04-27 LAB — HEMOGLOBIN A1C
Hgb A1c MFr Bld: 5.9 % — ABNORMAL HIGH (ref 4.8–5.6)
Hgb A1c MFr Bld: 6 % — ABNORMAL HIGH (ref 4.8–5.6)
Mean Plasma Glucose: 122.63 mg/dL
Mean Plasma Glucose: 125.5 mg/dL

## 2019-04-27 LAB — POCT ACTIVATED CLOTTING TIME: Activated Clotting Time: 323 seconds

## 2019-04-27 LAB — LACTIC ACID, PLASMA: Lactic Acid, Venous: 1.7 mmol/L (ref 0.5–1.9)

## 2019-04-27 LAB — HIV ANTIBODY (ROUTINE TESTING W REFLEX): HIV Screen 4th Generation wRfx: NONREACTIVE

## 2019-04-27 LAB — CBC
HCT: 43.5 % (ref 39.0–52.0)
Hemoglobin: 15.1 g/dL (ref 13.0–17.0)
MCH: 31.6 pg (ref 26.0–34.0)
MCHC: 34.7 g/dL (ref 30.0–36.0)
MCV: 91 fL (ref 80.0–100.0)
Platelets: 252 10*3/uL (ref 150–400)
RBC: 4.78 MIL/uL (ref 4.22–5.81)
RDW: 13.2 % (ref 11.5–15.5)
WBC: 10.1 10*3/uL (ref 4.0–10.5)
nRBC: 0 % (ref 0.0–0.2)

## 2019-04-27 LAB — ECHOCARDIOGRAM COMPLETE

## 2019-04-27 LAB — PROTIME-INR
INR: 1 (ref 0.8–1.2)
Prothrombin Time: 13.4 seconds (ref 11.4–15.2)

## 2019-04-27 LAB — POCT I-STAT, CHEM 8
BUN: 13 mg/dL (ref 8–23)
Calcium, Ion: 1.18 mmol/L (ref 1.15–1.40)
Chloride: 98 mmol/L (ref 98–111)
Creatinine, Ser: 0.7 mg/dL (ref 0.61–1.24)
Glucose, Bld: 194 mg/dL — ABNORMAL HIGH (ref 70–99)
HCT: 45 % (ref 39.0–52.0)
Hemoglobin: 15.3 g/dL (ref 13.0–17.0)
Potassium: 3.8 mmol/L (ref 3.5–5.1)
Sodium: 131 mmol/L — ABNORMAL LOW (ref 135–145)
TCO2: 21 mmol/L — ABNORMAL LOW (ref 22–32)

## 2019-04-27 LAB — RESPIRATORY PANEL BY RT PCR (FLU A&B, COVID)
Influenza A by PCR: NEGATIVE
Influenza B by PCR: NEGATIVE
SARS Coronavirus 2 by RT PCR: NEGATIVE

## 2019-04-27 LAB — MRSA PCR SCREENING: MRSA by PCR: NEGATIVE

## 2019-04-27 LAB — APTT: aPTT: 31 seconds (ref 24–36)

## 2019-04-27 LAB — TROPONIN I (HIGH SENSITIVITY)
Troponin I (High Sensitivity): 476 ng/L (ref <18)
Troponin I (High Sensitivity): >27000 ng/L (ref <18)
Troponin I (High Sensitivity): >27000 ng/L (ref <18)

## 2019-04-27 LAB — TSH: TSH: 0.889 u[IU]/mL (ref 0.350–4.500)

## 2019-04-27 SURGERY — LEFT HEART CATH AND CORONARY ANGIOGRAPHY
Anesthesia: LOCAL

## 2019-04-27 SURGERY — RIGHT HEART CATH

## 2019-04-27 MED ORDER — NITROGLYCERIN 1 MG/10 ML FOR IR/CATH LAB
INTRA_ARTERIAL | Status: AC
  Filled 2019-04-27: qty 10

## 2019-04-27 MED ORDER — TIROFIBAN (AGGRASTAT) BOLUS VIA INFUSION
INTRAVENOUS | Status: DC | PRN
  Administered 2019-04-27: 2267.5 ug via INTRAVENOUS

## 2019-04-27 MED ORDER — LIDOCAINE HCL (PF) 1 % IJ SOLN
INTRAMUSCULAR | Status: AC
  Filled 2019-04-27: qty 30

## 2019-04-27 MED ORDER — HYDRALAZINE HCL 20 MG/ML IJ SOLN: 10.0000 mg | INTRAMUSCULAR | Status: DC | PRN

## 2019-04-27 MED ORDER — ONDANSETRON HCL 4 MG/2ML IJ SOLN
INTRAMUSCULAR | Status: DC | PRN
  Administered 2019-04-27: 4 mg via INTRAVENOUS

## 2019-04-27 MED ORDER — SODIUM CHLORIDE 0.9 % IV SOLN: 250.0000 mL | INTRAVENOUS | Status: DC | PRN

## 2019-04-27 MED ORDER — HEPARIN SODIUM (PORCINE) 1000 UNIT/ML IJ SOLN
INTRAMUSCULAR | Status: AC
  Filled 2019-04-27: qty 1

## 2019-04-27 MED ORDER — SODIUM CHLORIDE 0.9% FLUSH
3.0000 mL | Freq: Two times a day (BID) | INTRAVENOUS | Status: DC
  Administered 2019-04-28 – 2019-05-01 (×4): 3 mL via INTRAVENOUS

## 2019-04-27 MED ORDER — HEPARIN (PORCINE) IN NACL 1000-0.9 UT/500ML-% IV SOLN
INTRAVENOUS | Status: DC | PRN
  Administered 2019-04-27 (×2): 500 mL

## 2019-04-27 MED ORDER — ONDANSETRON HCL 4 MG/2ML IJ SOLN
4.0000 mg | Freq: Four times a day (QID) | INTRAMUSCULAR | Status: DC | PRN
  Administered 2019-04-27 – 2019-05-02 (×5): 4 mg via INTRAVENOUS
  Filled 2019-04-27 (×3): qty 2

## 2019-04-27 MED ORDER — ATORVASTATIN CALCIUM 80 MG PO TABS
80.0000 mg | ORAL_TABLET | Freq: Every day | ORAL | Status: DC
  Administered 2019-04-28 – 2019-05-03 (×6): 80 mg via ORAL
  Filled 2019-04-27 (×6): qty 1

## 2019-04-27 MED ORDER — SODIUM CHLORIDE 0.9 % IV SOLN
INTRAVENOUS | Status: AC | PRN
  Administered 2019-04-27: 10 mL/h via INTRAVENOUS

## 2019-04-27 MED ORDER — SODIUM CHLORIDE 0.9% FLUSH: 3.0000 mL | INTRAVENOUS | Status: DC | PRN

## 2019-04-27 MED ORDER — ACETAMINOPHEN 325 MG PO TABS
650.0000 mg | ORAL_TABLET | ORAL | Status: DC | PRN
  Administered 2019-04-27 – 2019-05-03 (×7): 650 mg via ORAL
  Filled 2019-04-27 (×7): qty 2

## 2019-04-27 MED ORDER — CARVEDILOL 6.25 MG PO TABS: 6.2500 mg | ORAL_TABLET | Freq: Two times a day (BID) | ORAL | Status: DC

## 2019-04-27 MED ORDER — LIDOCAINE HCL (PF) 1 % IJ SOLN
INTRAMUSCULAR | Status: DC | PRN
  Administered 2019-04-27: 2 mL via SUBCUTANEOUS

## 2019-04-27 MED ORDER — ALUM & MAG HYDROXIDE-SIMETH 200-200-20 MG/5ML PO SUSP
30.0000 mL | ORAL | Status: DC | PRN
  Administered 2019-04-27 – 2019-04-28 (×2): 30 mL via ORAL
  Filled 2019-04-27 (×2): qty 30

## 2019-04-27 MED ORDER — ENOXAPARIN SODIUM 40 MG/0.4ML ~~LOC~~ SOLN
40.0000 mg | SUBCUTANEOUS | Status: DC
  Administered 2019-04-28 – 2019-05-04 (×7): 40 mg via SUBCUTANEOUS
  Filled 2019-04-27 (×7): qty 0.4

## 2019-04-27 MED ORDER — ASPIRIN 81 MG PO CHEW
81.0000 mg | CHEWABLE_TABLET | Freq: Every day | ORAL | Status: DC
  Administered 2019-04-28 – 2019-05-04 (×7): 81 mg via ORAL
  Filled 2019-04-27 (×7): qty 1

## 2019-04-27 MED ORDER — SODIUM CHLORIDE 0.9% FLUSH
3.0000 mL | Freq: Two times a day (BID) | INTRAVENOUS | Status: DC
  Administered 2019-04-28 (×2): 3 mL via INTRAVENOUS

## 2019-04-27 MED ORDER — VERAPAMIL HCL 2.5 MG/ML IV SOLN
INTRAVENOUS | Status: DC | PRN
  Administered 2019-04-27: 10 mL via INTRA_ARTERIAL

## 2019-04-27 MED ORDER — VERAPAMIL HCL 2.5 MG/ML IV SOLN
INTRAVENOUS | Status: AC
  Filled 2019-04-27: qty 2

## 2019-04-27 MED ORDER — LABETALOL HCL 5 MG/ML IV SOLN: 10.0000 mg | INTRAVENOUS | Status: DC | PRN

## 2019-04-27 MED ORDER — HEPARIN (PORCINE) IN NACL 1000-0.9 UT/500ML-% IV SOLN
INTRAVENOUS | Status: AC
  Filled 2019-04-27: qty 500

## 2019-04-27 MED ORDER — METOPROLOL TARTRATE 12.5 MG HALF TABLET: 12.5000 mg | ORAL_TABLET | Freq: Two times a day (BID) | ORAL | Status: DC

## 2019-04-27 MED ORDER — FUROSEMIDE 10 MG/ML IJ SOLN
20.0000 mg | Freq: Once | INTRAMUSCULAR | Status: AC
  Administered 2019-04-27: 20 mg via INTRAVENOUS

## 2019-04-27 MED ORDER — ALPRAZOLAM 0.25 MG PO TABS
0.2500 mg | ORAL_TABLET | Freq: Two times a day (BID) | ORAL | Status: DC | PRN
  Administered 2019-04-28 – 2019-05-02 (×3): 0.25 mg via ORAL
  Filled 2019-04-27 (×3): qty 1

## 2019-04-27 MED ORDER — SODIUM CHLORIDE 0.9% FLUSH
3.0000 mL | INTRAVENOUS | Status: DC | PRN
  Administered 2019-04-30: 3 mL via INTRAVENOUS

## 2019-04-27 MED ORDER — ONDANSETRON HCL 4 MG/2ML IJ SOLN
INTRAMUSCULAR | Status: AC
  Filled 2019-04-27: qty 2

## 2019-04-27 MED ORDER — IOHEXOL 350 MG/ML SOLN
INTRAVENOUS | Status: DC | PRN
  Administered 2019-04-27: 140 mL via INTRACARDIAC

## 2019-04-27 MED ORDER — SODIUM CHLORIDE 0.9% FLUSH
3.0000 mL | Freq: Two times a day (BID) | INTRAVENOUS | Status: DC
  Administered 2019-04-28 (×4): 3 mL via INTRAVENOUS

## 2019-04-27 MED ORDER — MILRINONE LACTATE IN DEXTROSE 20-5 MG/100ML-% IV SOLN
0.2500 ug/kg/min | INTRAVENOUS | Status: DC
  Administered 2019-04-28: 0.25 ug/kg/min via INTRAVENOUS
  Filled 2019-04-27: qty 100

## 2019-04-27 MED ORDER — TICAGRELOR 90 MG PO TABS
ORAL_TABLET | ORAL | Status: AC
  Filled 2019-04-27: qty 2

## 2019-04-27 MED ORDER — ZOLPIDEM TARTRATE 5 MG PO TABS
5.0000 mg | ORAL_TABLET | Freq: Every evening | ORAL | Status: DC | PRN
  Administered 2019-04-28 – 2019-05-03 (×7): 5 mg via ORAL
  Filled 2019-04-27 (×7): qty 1

## 2019-04-27 MED ORDER — TIROFIBAN HCL IN NACL 5-0.9 MG/100ML-% IV SOLN
INTRAVENOUS | Status: DC | PRN
  Administered 2019-04-27: 0.15 ug/kg/min via INTRAVENOUS

## 2019-04-27 MED ORDER — FUROSEMIDE 10 MG/ML IJ SOLN
INTRAMUSCULAR | Status: AC
  Filled 2019-04-27: qty 4

## 2019-04-27 MED ORDER — ONDANSETRON HCL 4 MG/2ML IJ SOLN: 4.0000 mg | Freq: Four times a day (QID) | INTRAMUSCULAR | Status: DC | PRN

## 2019-04-27 MED ORDER — TICAGRELOR 90 MG PO TABS
90.0000 mg | ORAL_TABLET | Freq: Two times a day (BID) | ORAL | Status: DC
  Administered 2019-04-27 – 2019-05-04 (×14): 90 mg via ORAL
  Filled 2019-04-27 (×14): qty 1

## 2019-04-27 MED ORDER — NITROGLYCERIN 0.4 MG SL SUBL
0.4000 mg | SUBLINGUAL_TABLET | SUBLINGUAL | Status: DC | PRN
  Administered 2019-04-27 – 2019-04-30 (×2): 0.4 mg via SUBLINGUAL
  Filled 2019-04-27 (×3): qty 1

## 2019-04-27 MED ORDER — FENTANYL CITRATE (PF) 100 MCG/2ML IJ SOLN
25.0000 ug | INTRAMUSCULAR | Status: DC | PRN
  Administered 2019-04-28 (×3): 25 ug via INTRAVENOUS
  Filled 2019-04-27 (×3): qty 2

## 2019-04-27 MED ORDER — NICOTINE 21 MG/24HR TD PT24
21.0000 mg | MEDICATED_PATCH | Freq: Every day | TRANSDERMAL | Status: DC
  Administered 2019-04-28 – 2019-05-04 (×3): 21 mg via TRANSDERMAL
  Filled 2019-04-27 (×6): qty 1

## 2019-04-27 MED ORDER — MORPHINE SULFATE (PF) 10 MG/ML IV SOLN
2.0000 mg | Freq: Once | INTRAVENOUS | Status: AC
  Administered 2019-04-27: 2 mg via INTRAVENOUS

## 2019-04-27 MED ORDER — MORPHINE SULFATE (PF) 10 MG/ML IV SOLN
INTRAVENOUS | Status: AC
  Filled 2019-04-27: qty 1

## 2019-04-27 MED ORDER — ACETAMINOPHEN 325 MG PO TABS: 650.0000 mg | ORAL_TABLET | ORAL | Status: DC | PRN

## 2019-04-27 MED ORDER — LIDOCAINE HCL (PF) 1 % IJ SOLN
INTRAMUSCULAR | Status: DC | PRN
  Administered 2019-04-27: 2 mL

## 2019-04-27 MED ORDER — CHLORHEXIDINE GLUCONATE CLOTH 2 % EX PADS
6.0000 | MEDICATED_PAD | Freq: Every day | CUTANEOUS | Status: DC
  Administered 2019-04-27 – 2019-05-04 (×8): 6 via TOPICAL

## 2019-04-27 MED ORDER — DIGOXIN 125 MCG PO TABS
0.1250 mg | ORAL_TABLET | Freq: Every day | ORAL | Status: DC
  Administered 2019-04-28 – 2019-05-04 (×8): 0.125 mg via ORAL
  Filled 2019-04-27 (×8): qty 1

## 2019-04-27 MED ORDER — ATORVASTATIN CALCIUM 80 MG PO TABS
80.0000 mg | ORAL_TABLET | Freq: Every day | ORAL | Status: DC
  Filled 2019-04-27: qty 1

## 2019-04-27 MED ORDER — ENOXAPARIN SODIUM 40 MG/0.4ML ~~LOC~~ SOLN: 40.0000 mg | SUBCUTANEOUS | Status: DC

## 2019-04-27 MED ORDER — HEPARIN SODIUM (PORCINE) 1000 UNIT/ML IJ SOLN
INTRAMUSCULAR | Status: DC | PRN
  Administered 2019-04-27: 11000 [IU] via INTRAVENOUS

## 2019-04-27 MED ORDER — TICAGRELOR 90 MG PO TABS
ORAL_TABLET | ORAL | Status: DC | PRN
  Administered 2019-04-27: 180 mg via ORAL

## 2019-04-27 SURGICAL SUPPLY — 17 items
BALLN SAPPHIRE 2.5X12 (BALLOONS) ×2
BALLN SAPPHIRE ~~LOC~~ 3.75X12 (BALLOONS) ×2 IMPLANT
BALLN SAPPHIRE ~~LOC~~ 4.0X8 (BALLOONS) ×2 IMPLANT
BALLOON SAPPHIRE 2.5X12 (BALLOONS) ×1 IMPLANT
CATH 5FR JL3.5 JR4 ANG PIG MP (CATHETERS) ×2 IMPLANT
CATH LAUNCHER 6FR EBU3.5 (CATHETERS) ×2 IMPLANT
DEVICE RAD COMP TR BAND LRG (VASCULAR PRODUCTS) ×2 IMPLANT
GLIDESHEATH SLEND SS 6F .021 (SHEATH) ×6 IMPLANT
GUIDEWIRE INQWIRE 1.5J.035X260 (WIRE) ×1 IMPLANT
INQWIRE 1.5J .035X260CM (WIRE) ×2
KIT HEART LEFT (KITS) ×2 IMPLANT
KIT HEMO VALVE WATCHDOG (MISCELLANEOUS) ×2 IMPLANT
PACK CARDIAC CATHETERIZATION (CUSTOM PROCEDURE TRAY) ×2 IMPLANT
STENT SYNERGY DES 3.5X16 (Permanent Stent) ×2 IMPLANT
TRANSDUCER W/STOPCOCK (MISCELLANEOUS) ×2 IMPLANT
TUBING CIL FLEX 10 FLL-RA (TUBING) ×2 IMPLANT
WIRE HI TORQ BMW 190CM (WIRE) ×4 IMPLANT

## 2019-04-27 SURGICAL SUPPLY — 12 items
CATH SWAN GANZ VIP 7.5F (CATHETERS) ×2 IMPLANT
ELECT DEFIB PAD ADLT CADENCE (PAD) ×2 IMPLANT
GLIDESHEATH SLEND SS 6F .021 (SHEATH) IMPLANT
GUIDEWIRE INQWIRE 1.5J.035X260 (WIRE) IMPLANT
INQWIRE 1.5J .035X260CM (WIRE)
KIT HEART LEFT (KITS) IMPLANT
PACK CARDIAC CATHETERIZATION (CUSTOM PROCEDURE TRAY) ×2 IMPLANT
SHEATH PINNACLE 8F 10CM (SHEATH) ×2 IMPLANT
SHEATH PROBE COVER 6X72 (BAG) ×2 IMPLANT
SLEEVE REPOSITIONING LENGTH 30 (MISCELLANEOUS) ×2 IMPLANT
TRANSDUCER W/STOPCOCK (MISCELLANEOUS) ×2 IMPLANT
TUBING CIL FLEX 10 FLL-RA (TUBING) IMPLANT

## 2019-04-27 NOTE — H&P (Addendum)
Cardiology Admission History and Physical:   Patient ID: Brandon [name]; MRN: [id number]; DOB: [birth year]   Admission date: 04/27/2019  Primary Care Provider: [name], MD Primary Cardiologist: [name], MD  Primary Electrophysiologist: None    Chief Complaint: STEMI  Patient Profile:   Brandon [name] is a 64 y.o. male with a history of CAD and remote MI, HTN, HLD, BPH, OA, and tobacco use.  He developed chest pain, and called EMS.  History of Present Illness:   [name] developed chest pain today.  He stated the chest pain was severe and reminded him of his previous MI.  He called EMS.  He got 324 mg of aspirin total and 2 sublingual nitroglycerin in route to the hospital.  His ECG was consistent with STEMI and he was taken directly to the Cath Lab.  Upon arrival to the hospital, his pain was much improved.  The pain had been associated with shortness of breath, but no nausea or vomiting.  He has not had a cough.  He has not had fevers or chills.  He has had no sick contacts.  He agrees that he needs to quit smoking.  Until the chest pain today, he had been in his usual state of health.   Past Medical History:  Diagnosis Date  . Ankylosing spondylitis ([place])   . Arthritis   . BENIGN PROSTATIC HYPERTROPHY 06/07/2008  . COLONIC POLYPS, HX OF 06/07/2008  . H/O hiatal hernia   . Heart murmur   . HYPERLIPIDEMIA 06/07/2008  . HYPERTENSION 06/07/2008  . Myocardial infarction [facility]) 2005   NSTEMI, s/p LAD stent  . NEPHROLITHIASIS, HX OF 06/07/2008  . STEMI (ST elevation myocardial infarction) ([place]) 04/27/2019    Past Surgical History:  Procedure Laterality Date  . CORONARY ANGIOPLASTY WITH STENT PLACEMENT  2005   LAD stent, jailed diagonal  . LAMINECTOMY     lumbar  . LUMBAR FUSION  01/2012  . TONSILLECTOMY    . warthin tumor removal Left 2014     Medications Prior to Admission: Prior to Admission medications   Medication Sig Start Date End Date Taking?  Authorizing Provider  atenolol (TENORMIN) 50 MG tablet Take 1 tablet (50 mg total) by mouth daily. 04/15/19   [name], MD  atorvastatin (LIPITOR) 80 MG tablet TAKE 1 TABLET BY MOUTH ONCE DAILY AT 6PM. 04/13/18   [name], MD  etanercept (ENBREL) 50 MG/ML injection Inject 50 mg into the skin once a week. Sunday     [provider]  ibuprofen (ADVIL) 800 MG tablet Take 1 tablet (800 mg total) by mouth 2 (two) times daily as needed. 10/12/18   [name], MD  olmesartan (BENICAR) 40 MG tablet TAKE 1 TABLET(40 MG) BY MOUTH DAILY 04/15/19   [name], MD  sildenafil (VIAGRA) 50 MG tablet take 1 tablet by mouth once daily if needed 02/13/18   [name], MD  tamsulosin (FLOMAX) 0.4 MG CAPS capsule TAKE 1 CAPSULE(0.4 MG) BY MOUTH DAILY 09/22/18   [name], MD  varenicline (CHANTIX CONTINUING MONTH PAK) 1 MG tablet Take 1 tablet (1 mg total) by mouth 2 (two) times daily. 12/13/16   Marletta Lor, MD  varenicline (CHANTIX STARTING MONTH PAK) 0.5 MG X 11 & 1 MG X 42 tablet Take one 0.5 mg tablet by mouth once daily for 3 days, then increase to one 0.5 mg tablet twice daily for 4 days, then increase to one 1 mg  tablet twice daily. 12/13/16   Marletta Lor, MD     Allergies:   No Known Allergies  Social History:   Social History   Socioeconomic History  . Marital status: Single    Spouse name: Not on file  . Number of children: Not on file  . Years of education: Not on file  . Highest education level: Not on file  Occupational History  . 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 Smoking status: Current Every Day Smoker    Packs/day: 0.50    Years: 40.00    Pack years: 20.00    Types: Cigarettes  . Smokeless tobacco: Never Used  Substance and Sexual Activity  . Alcohol use: Yes    Comment: daily  . Drug use: No  . Sexual activity: Not on file  Other Topics Concern  . Not on file  Social History Narrative  . Not  on file   Social Determinants of Health   Financial Resource Strain:   . Difficulty of Paying Living Expenses: Not on file  Food Insecurity:   . Worried About Charity fundraiser in the Last Year: Not on file  . Ran Out of Food in the Last Year: Not on file  Transportation Needs:   . Lack of Transportation (Medical): Not on file  . Lack of Transportation (Non-Medical): Not on file  Physical Activity:   . Days of Exercise per Week: Not on file  . Minutes of Exercise per Session: Not on file  Stress:   . Feeling of Stress : Not on file  Social Connections:   . Frequency of Communication with Friends and Family: Not on file  . Frequency of Social Gatherings with Friends and Family: Not on file  . Attends Religious Services: Not on file  . Active Member of Clubs or Organizations: Not on file  . Attends Archivist Meetings: Not on file  . Marital Status: Not on file  Intimate Partner Violence:   . Fear of Current or Ex-Partner: Not on file  . Emotionally Abused: Not on file  . Physically Abused: Not on file  . Sexually Abused: Not on file    Family History:  The patient's family history includes Arthritis in his sister; Depression in his father; Heart failure in his mother. There is no history of Other, Colon cancer, Esophageal cancer, Rectal cancer, or Stomach cancer.   The patient He indicated that his mother is deceased. He indicated that his father is deceased. He indicated that his sister is alive. He indicated that two of his three brothers are alive. He indicated that the status of his neg hx is unknown.    ROS:  Please see the history of present illness.  All other ROS reviewed and negative.     Physical Exam/Data:  There were no vitals filed for this visit. No intake or output data in the 24 hours ending 04/27/19 1201 There were no vitals filed for this visit. There is no height or weight on file to calculate BMI.  General:  Well nourished, well developed,  male in acute distress HEENT: normal Lymph: no adenopathy Neck:  JVD not elevated Endocrine:  No thryomegaly Vascular: No carotid bruits; 4/4 extremity pulses 2+ bilaterally  Cardiac:  normal S1, S2; RRR; no murmur, no rub or gallop  Lungs:  clear to auscultation bilaterally, no wheezing, rhonchi or rales  Abd: soft, nontender, no hepatomegaly  Ext: No edema Musculoskeletal:  No deformities, BUE and BLE strength normal and equal Skin: warm and dry  Neuro:  CNs 2-12 intact, no focal abnormalities noted Psych:  Normal affect    EKG:  The ECG was personally reviewed: Sinus rhythm, anterior ST elevation Telemetry: Sinus rhythm  Relevant CV Studies:  ECHO: 2016 - Left ventricle: The cavity size was normal. Wall thickness was   normal. Systolic function was normal. The estimated ejection   fraction was in the range of 60% to 65%. Wall motion was normal;   there were no regional wall motion abnormalities. Doppler   parameters are consistent with abnormal left ventricular   relaxation (grade 1 diastolic dysfunction). - Aortic valve: There was mild stenosis. Mean gradient (S): 13 mm   Hg. Peak gradient (S): 27 mm Hg. Valve area (VTI): 1.65 cm^2.   Valve area (Vmax): 1.54 cm^2. Valve area (Vmean): 1.81 cm^2. - Left atrium: The atrium was mildly dilated.   Laboratory Data: To be done in Cath Lab  ChemistryNo results for input(s): NA, K, CL, CO2, GLUCOSE, BUN, CREATININE, CALCIUM, GFRNONAA, GFRAA, ANIONGAP in the last 168 hours.  No results for input(s): PROT, ALBUMIN, AST, ALT, ALKPHOS, BILITOT in the last 168 hours. HematologyNo results for input(s): WBC, RBC, HGB, HCT, MCV, MCH, MCHC, RDW, PLT in the last 168 hours. Cardiac Enzymes  High Sensitivity Troponin:  No results for input(s): TROPONINIHS in the last 720 hours.   BNPNo results for input(s): BNP, PROBNP in the last 168 hours.  DDimer No results for input(s): DDIMER in the last 168 hours. Lipids:  Lab Results  Component  Value Date   CHOL 153 04/15/2019   HDL 55.90 04/15/2019   LDLCALC 84 04/15/2019   TRIG 63.0 04/15/2019   CHOLHDL 3 04/15/2019   INR: No results found for: INR, PROTIME A1c:  Lab Results  Component Value Date   HGBA1C 5.7 04/15/2019   Thyroid:  Lab Results  Component Value Date   TSH 0.76 12/06/2016    Radiology/Studies:  No results found.  Assessment and Plan:   1.  STEMI: -He is being taken emergently to the Cath Lab with further evaluation and treatment depending on the results -Change his beta-blocker from atenolol to carvedilol for guideline directed therapy -Continue ARB -Continue high-dose statin  2.  Hypertension: -Confirm home medications through pharmacy and make sure that he is on a beta-blocker -Check renal function and continue beta-blocker/ARB  3.  Hyperlipidemia, goal LDL less than 70 -Continue high-dose statin -Check lipids and liver functions  4.  Tobacco use: -Cessation encouraged  Principal Problem:   STEMI (ST elevation myocardial infarction) ([place]) Active Problems:   Hyperlipidemia with target LDL less than 70   TOBACCO ABUSE   Essential hypertension     For questions or updates, please contact [place] Please consult [email or web address] for contact info under Cardiology/STEMI.    Signed, Rosaria Ferries, PA-C  04/27/2019 12:01 PM   I have examined the patient and reviewed assessment and plan and discussed with patient.  Agree with above as stated.  I personally reviewed the ECG and made the deicision for the patient to have emergency cath.  Occluded LAD- instent restenosis.  Stent from 2005.   LAD restented.  Patient with borderline BP, but this increased a little while after PCI.  Pain improved.  He has CTO of RCA which can be managed medically.  He needs to stop smoking.  I spoke to his sister regarding the results.   Likely systolic dysfunction.  Check echo.  Will need medical therapy for low EF as well.   Larae Grooms

## 2019-04-27 NOTE — Progress Notes (Signed)
States nausea is a little less and that breathing is getting a little better.

## 2019-04-27 NOTE — Progress Notes (Signed)
  Echocardiogram 2D Echocardiogram has been performed.  Brandon [name] 04/27/2019, 3:50 PM

## 2019-04-27 NOTE — Significant Event (Signed)
Cardiology [place] Note  Contacted by care nurse. Patient hypotensive. BP 70's/50's. Patient not feeling well. Having some chest pain about 5/10. Feels short of breath.   Patient examined. No JVP elevation. Soft systolic murmur at the apex. Pale in appearance. Laying in bed at 45 degrees. Legs warm. Lungs clear. NO JVP elevation.   Blood pressure cuff recycled several times. BP's remain in the 70's/50's. Patient received dose of SLN about 30 minutes prior. ECG showing residual ST elevation in anterior precordial leads but much improved compared to presenting STEMI ECG. Bedside ultrasound with no pericardial effusion, no appreciable MR, no appreciable VSD.   Case discussed with Dr [name] on call for cath lab. Agrees that patient should come back to the lab for hemodynamic evaluation. Will then decide whether mechanical support is necessary. Patient updated. Remains in guarded condition.   Marcie Mowers, MD Cardiology Fellow, PGY-7

## 2019-04-27 NOTE — Consult Note (Addendum)
Advanced Heart Failure Team Consult Note   Primary Physician: [name], MD PCP-Cardiologist:  [name], MD  Reason for Consultation: Acute systolic HF and cardiogenic shock  HPI:    Brandon [name] is seen today for evaluation of acute systolic HF/cardiogenic shock  at the request of Dr. [name].   63 y/o smoker (originally from [place], [place]) with h/o CAD s/p previous MI, HTN, HL admitted earlier today with several days of stuttering CP. Initial ECG showed anterior ST elevation. HsTrop 476 -> >27k  Taken to cath lab which showed chronically occluded RCA (with collaterals from RCA) and thrombotic occlusion of proximal LAD. Underwent PCI of LAD.   ECHO EF 30-35% with apical aneursym. Mod AS. Minimal MR. No evidence VSD.   This evening developed recurrent CP and SOB. Got 1 SL NTG and developed hypotension with SBP 70s was cold and clammy. Repeat bedside echo without significant change.  Taken to cath lab by Dr. [name] for [facility] and possible mechanical support.   RHC MAP 84 RA 3 RV 37/5 PA 31/8 (16) PCWP 18 PA sat 63%  Fick 4.1/1.9 SVR 1580  CPO 0.75  Lactate 1.7  Review of Systems: [y] = yes, [ ]  = no   . General: Weight gain [ ] ; Weight loss [ ] ; Anorexia [ ] ; Fatigue [ ] ; Fever [ ] ; Chills [ ] ; Weakness [ ]   . Cardiac: Chest pain/pressure [ y]; Resting SOB [ y]; Exertional SOB [name] ]; Orthopnea [ ] ; Pedal Edema [ ] ; Palpitations [ ] ; Syncope [ ] ; Presyncope [ ] ; Paroxysmal nocturnal dyspnea[ ]   . Pulmonary: Cough [ ] ; Wheezing[ ] ; Hemoptysis[ ] ; Sputum [ ] ; Snoring [ ]   . GI: Vomiting[ ] ; Dysphagia[ ] ; Melena[ ] ; Hematochezia [ ] ; Heartburn[y ]; Abdominal pain [ ] ; Constipation [ ] ; Diarrhea [ ] ; BRBPR [ ]   . GU: Hematuria[ ] ; Dysuria [ ] ; Nocturia[ ]   . Vascular: Pain in legs with walking [ ] ; Pain in feet with lying flat [ ] ; Non-healing sores [ ] ; Stroke [ ] ; TIA [ ] ; Slurred speech [ ] ;  . Neuro: Headaches[ ] ; Vertigo[ ] ; Seizures[ ] ; Paresthesias[ ] ;Blurred  vision [ ] ; Diplopia [ ] ; Vision changes [ ]   . Ortho/Skin: Arthritis [ y]; Joint pain [name] ]; Muscle pain [ ] ; Joint swelling [ ] ; Back Pain [ ] ; Rash [ ]   . Psych: Depression[ ] ; Anxiety[ ]   . Heme: Bleeding problems [ ] ; Clotting disorders [ ] ; Anemia [ ]   . Endocrine: Diabetes [ ] ; Thyroid dysfunction[ ]   Home Medications Prior to Admission medications   Medication Sig Start Date End Date Taking? Authorizing Provider  atenolol (TENORMIN) 50 MG tablet Take 1 tablet (50 mg total) by mouth daily. 04/15/19  Yes [name], MD  atorvastatin (LIPITOR) 80 MG tablet TAKE 1 TABLET BY MOUTH ONCE DAILY AT 6PM. Patient taking differently: Take 80 mg by mouth daily at 6 PM.  04/13/18  Yes [name], MD  inFLIXimab in sodium chloride 0.9 % Inject into the vein every 6 (six) weeks.   Yes [provider]  olmesartan (BENICAR) 40 MG tablet TAKE 1 TABLET(40 MG) BY MOUTH DAILY Patient taking differently: Take 40 mg by mouth daily.  04/15/19  Yes [name], MD  tamsulosin (FLOMAX) 0.4 MG CAPS capsule TAKE 1 CAPSULE(0.4 MG) BY MOUTH DAILY Patient taking differently: Take 0.4 mg by mouth daily.  09/22/18  Yes [name], MD  ibuprofen (ADVIL) 800 MG tablet  Take 1 tablet (800 mg total) by mouth 2 (two) times daily as needed. Patient not taking: Reported on 04/27/2019 10/12/18   [name], MD  sildenafil (VIAGRA) 50 MG tablet take 1 tablet by mouth once daily if needed Patient not taking: Reported on 04/27/2019 02/13/18   [name], MD  varenicline (CHANTIX CONTINUING MONTH PAK) 1 MG tablet Take 1 tablet (1 mg total) by mouth 2 (two) times daily. Patient not taking: Reported on 04/27/2019 12/13/16   Marletta Lor, MD  varenicline (CHANTIX STARTING MONTH PAK) 0.5 MG X 11 & 1 MG X 42 tablet Take one 0.5 mg tablet by mouth once daily for 3 days, then increase to one 0.5 mg tablet twice daily for 4 days, then increase to one 1 mg tablet twice daily. Patient not taking:  Reported on 04/27/2019 12/13/16   Marletta Lor, MD    Past Medical History: Past Medical History:  Diagnosis Date  . Ankylosing spondylitis ([place])   . Arthritis   . BENIGN PROSTATIC HYPERTROPHY 06/07/2008  . COLONIC POLYPS, HX OF 06/07/2008  . H/O hiatal hernia   . Heart murmur   . HYPERLIPIDEMIA 06/07/2008  . HYPERTENSION 06/07/2008  . Myocardial infarction [facility]) 2005   NSTEMI, s/p LAD stent  . NEPHROLITHIASIS, HX OF 06/07/2008  . STEMI (ST elevation myocardial infarction) ([place]) 04/27/2019    Past Surgical History: Past Surgical History:  Procedure Laterality Date  . CORONARY ANGIOPLASTY WITH STENT PLACEMENT  2005   LAD stent, jailed diagonal  . LAMINECTOMY     lumbar  . LUMBAR FUSION  01/2012  . TONSILLECTOMY    . warthin tumor removal Left 2014    Family History: Family History  Problem Relation Age of Onset  . Depression Father   . Arthritis Sister        RA  . Heart failure Mother   . Other Neg Hx   . Colon cancer Neg Hx   . Esophageal cancer Neg Hx   . Rectal cancer Neg Hx   . Stomach cancer Neg Hx     Social History: Social History   Socioeconomic History  . Marital status: Single    Spouse name: Not on file  . Number of children: Not on file  . Years of education: Not on file  . Highest education level: Not on file  Occupational History  . 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 Smoking status: Current Every Day Smoker    Packs/day: 0.50    Years: 40.00    Pack years: 20.00    Types: Cigarettes  . Smokeless tobacco: Never Used  Substance and Sexual Activity  . Alcohol use: Yes    Comment: daily  . Drug use: No  . Sexual activity: Not on file  Other Topics Concern  . Not on file  Social History Narrative  . Not on file   Social Determinants of Health   Financial Resource Strain:   . Difficulty of Paying Living Expenses: Not on file  Food Insecurity:   . Worried About Charity fundraiser in the Last Year: Not  on file  . Ran Out of Food in the Last Year: Not on file  Transportation Needs:   . Lack of Transportation (Medical): Not on file  . Lack of Transportation (Non-Medical): Not on file  Physical Activity:   . Days of Exercise per Week: Not on file  . Minutes of Exercise per Session: Not  on file  Stress:   . Feeling of Stress : Not on file  Social Connections:   . Frequency of Communication with Friends and Family: Not on file  . Frequency of Social Gatherings with Friends and Family: Not on file  . Attends Religious Services: Not on file  . Active Member of Clubs or Organizations: Not on file  . Attends Archivist Meetings: Not on file  . Marital Status: Not on file    Allergies:  No Known Allergies  Objective:    Vital Signs:   Temp:  [98.1 F (36.7 C)] 98.1 F (36.7 C) (12/15 1927) Pulse Rate:  [0-119] 0 (12/15 2302) Resp:  [0-34] 0 (12/15 2302) BP: (84-117)/(64-87) 117/87 (12/15 2302) SpO2:  [92 %-100 %] 97 % (12/15 2302)    Weight change: There were no vitals filed for this visit.  Intake/Output:   Intake/Output Summary (Last 24 hours) at 04/27/2019 August 11, 2303 Last data filed at 04/27/2019 1800 Gross per 24 hour  Intake --  Output 425 ml  Net -425 ml      Physical Exam    General:  Sitting up on cath lab table. No resp difficulty HEENT: normal Neck: supple. JVP flat . Carotids 2+ bilat; + bruits. No lymphadenopathy or thyromegaly appreciated. Cor: PMI nondisplaced. Regular rate & rhythm. 2/6 AS Lungs: clear Abdomen: soft, nontender, nondistended. No hepatosplenomegaly. No bruits or masses. Good bowel sounds. Extremities: no cyanosis, clubbing, rash, edema warm Neuro: alert & orientedx3, cranial nerves grossly intact. moves all 4 extremities w/o difficulty. Affect pleasant   Telemetry   Sinus 80s Personally reviewed  EKG    ST 103 with anterior ST elevation Personally reviewed   Labs   Basic Metabolic Panel: Recent Labs  Lab 04/27/19 1155  04/27/19 1208 04/27/19 08-11-2034 04/27/19 2148  NA 131* 130* 132* 129*  K 3.8 3.7 4.4 3.9  CL 98 99 100 99  CO2  --  18* 19* 20*  GLUCOSE 194* 192* 132* 148*  BUN 13 12 15 15   CREATININE 0.70 1.14 0.99 0.95  CALCIUM  --  9.2 9.2 9.0    Liver Function Tests: Recent Labs  Lab 04/27/19 1208 04/27/19 2034-08-11  AST 25 480*  ALT 25 103*  ALKPHOS 88 91  BILITOT 0.6 1.2  PROT 6.9 7.0  ALBUMIN 3.7 3.6   No results for input(s): LIPASE, AMYLASE in the last 168 hours. No results for input(s): AMMONIA in the last 168 hours.  CBC: Recent Labs  Lab 04/27/19 1155 04/27/19 1208 04/27/19 08/11/34  WBC  --  10.1 16.7*  NEUTROABS  --   --  14.6*  HGB 15.3 15.1 16.0  HCT 45.0 43.5 45.5  MCV  --  91.0 89.6  PLT  --  252 251    Cardiac Enzymes: No results for input(s): CKTOTAL, CKMB, CKMBINDEX, TROPONINI in the last 168 hours.  BNP: BNP (last 3 results) No results for input(s): BNP in the last 8760 hours.  ProBNP (last 3 results) No results for input(s): PROBNP in the last 8760 hours.   CBG: No results for input(s): GLUCAP in the last 168 hours.  Coagulation Studies: Recent Labs    04/27/19 08-11-1206  LABPROT 13.4  INR 1.0     Imaging   CARDIAC CATHETERIZATION  Result Date: 04/27/2019 1. Normal right heart pressures 2. Mildly elevated LV filling pressures. 3. Cardiac index of 1.9. Discussed with Dr [name]. Will leave Swan in place and monitor. Start Milrinone for inotropic support.   CARDIAC  CATHETERIZATION  Result Date: 04/27/2019  Prox RCA to Mid RCA lesion is 100% stenosed. THis is a chronic total occlusion. Left to right collaterals.  Prox Cx lesion is 50% stenosed.  Mid LAD lesion is 100% stenosed.  A drug-eluting stent was successfully placed using a STENT SYNERGY DES 3.5X16.  Post intervention, there is a 0% residual stenosis.  LV end diastolic pressure is moderately elevated.  There is no aortic valve stenosis.  He will need echo and aggressive medical therapy  for LV dysfunction. Watch in ICU.  Smoking cessation recommended.   ECHOCARDIOGRAM COMPLETE  Result Date: 04/27/2019   ECHOCARDIOGRAM REPORT   Patient Name:   Brandon [name] Duet Date of Exam: 04/27/2019 Medical Rec #:  [id number]      Height:       70.0 in Accession #:    [id number]     Weight:       200.0 lb Date of Birth:  07-Jun-1955       BSA:          2.09 m Patient Age:    17 years       BP:           90/74 mmHg Patient Gender: M              HR:           101 bpm. Exam Location:  Inpatient Procedure: 2D Echo, Cardiac Doppler and Color Doppler Indications:    Chest pain  History:        Patient has prior history of Echocardiogram examinations, most                 recent 12/12/2014. CAD and Previous Myocardial Infarction, Aortic                 Valve Disease; Risk Factors:Hypertension, Dyslipidemia and                 Current Smoker. Lumbar stenosis.  Sonographer:    Dustin Flock Referring Phys: 73 RHONDA G BARRETT  Sonographer Comments: Lumbar stenosis. IMPRESSIONS  1. Left ventricular ejection fraction, by visual estimation, is 30 to 35%. The left ventricle has moderately decreased function. There is no left ventricular hypertrophy.  2. Moderate hypokinesis of the left ventricular, mid-apical inferior wall.  3. Severe hypokinesis of the left ventricular, mid-apical anteroseptal wall, anterior wall and apical segment.  4. Elevated left atrial pressure.  5. Left ventricular diastolic parameters are consistent with Grade I diastolic dysfunction (impaired relaxation).  6. The left ventricle demonstrates regional wall motion abnormalities.  7. Global right ventricle has normal systolic function.The right ventricular size is normal. No increase in right ventricular wall thickness.  8. Left atrial size was mildly dilated.  9. Right atrial size was normal. 10. Moderate mitral annular calcification. 11. The mitral valve is normal in structure. No evidence of mitral valve regurgitation. 12. The aortic valve is  tricuspid. Aortic valve regurgitation is not visualized. Moderate aortic valve stenosis. The aortic valve gradients are underestimated due to the low left ventricular systolic function. 13. The pulmonic valve was not well visualized. Pulmonic valve regurgitation is not visualized. 14. The inferior vena cava is normal in size with greater than 50% respiratory variability, suggesting right atrial pressure of 3 mmHg. 15. The tricuspid valve is normal in structure. Tricuspid valve regurgitation is not demonstrated. 16. TR signal is inadequate for assessing pulmonary artery systolic pressure. 17. Compared to 12/12/2014, there is a marked reduction in left ventricular  systolic function due to new wall motion abnormalities in the LAD and right coronary artery distribution. FINDINGS  Left Ventricle: Left ventricular ejection fraction, by visual estimation, is 30 to 35%. The left ventricle has moderately decreased function. Severe hypokinesis of the left ventricular, mid-apical anteroseptal wall, anterior wall and apical segment. Moderate hypokinesis of the left ventricular, mid-apical inferior wall. The left ventricle demonstrates regional wall motion abnormalities. The left ventricular internal cavity size was the left ventricle is normal in size. There is no left ventricular hypertrophy. Left ventricular diastolic parameters are consistent with Grade I diastolic dysfunction (impaired relaxation). Elevated left atrial pressure. Right Ventricle: The right ventricular size is normal. No increase in right ventricular wall thickness. Global RV systolic function is has normal systolic function. Left Atrium: Left atrial size was mildly dilated. Right Atrium: Right atrial size was normal in size Pericardium: There is no evidence of pericardial effusion. Mitral Valve: The mitral valve is normal in structure. Moderate mitral annular calcification. No evidence of mitral valve regurgitation. Tricuspid Valve: The tricuspid valve is  normal in structure. Tricuspid valve regurgitation is not demonstrated. Aortic Valve: The aortic valve is tricuspid. . There is moderate thickening and moderate calcification of the aortic valve. Aortic valve regurgitation is not visualized. Moderate aortic stenosis is present. There is moderate thickening of the aortic valve. There is moderate calcification of the aortic valve. Aortic valve mean gradient measures 10.1 mmHg. Aortic valve peak gradient measures 17.6 mmHg. Aortic valve area, by VTI measures 1.34 cm. Pulmonic Valve: The pulmonic valve was not well visualized. Pulmonic valve regurgitation is not visualized. Pulmonic regurgitation is not visualized. Aorta: The aortic root and ascending aorta are structurally normal, with no evidence of dilitation. Venous: The inferior vena cava is normal in size with greater than 50% respiratory variability, suggesting right atrial pressure of 3 mmHg. IAS/Shunts: No atrial level shunt detected by color flow Doppler.  LEFT VENTRICLE PLAX 2D LVIDd:         5.40 cm  Diastology LVIDs:         4.40 cm  LV e' lateral:   6.09 cm/s LV PW:         1.00 cm  LV E/e' lateral: 14.6 LV IVS:        1.10 cm  LV e' medial:    5.33 cm/s LVOT diam:     2.28 cm  LV E/e' medial:  16.7 LV SV:         54 ml LV SV Index:   25.10 LVOT Area:     4.08 cm  RIGHT VENTRICLE RV Basal diam:  2.60 cm RV S prime:     10.10 cm/s TAPSE (M-mode): 2.6 cm LEFT ATRIUM             Index       RIGHT ATRIUM           Index LA diam:        3.90 cm 1.87 cm/m  RA Area:     11.20 cm LA Vol (A2C):   43.1 ml 20.65 ml/m RA Volume:   23.90 ml  11.45 ml/m LA Vol (A4C):   58.2 ml 27.88 ml/m LA Biplane Vol: 54.7 ml 26.21 ml/m  AORTIC VALVE AV Area (Vmax):    1.44 cm AV Area (Vmean):   1.51 cm AV Area (VTI):     1.34 cm AV Vmax:           209.92 cm/s AV Vmean:  149.617 cm/s AV VTI:            0.427 m AV Peak Grad:      17.6 mmHg AV Mean Grad:      10.1 mmHg LVOT Vmax:         74.20 cm/s LVOT Vmean:         55.500 cm/s LVOT VTI:          0.140 m LVOT/AV VTI ratio: 0.33  AORTA Ao Root diam: 3.70 cm MITRAL VALVE MV Area (PHT): 9.85 cm             SHUNTS MV PHT:        22.33 msec           Systemic VTI:  0.14 m MV Decel Time: 77 msec              Systemic Diam: 2.28 cm MV E velocity: 89.20 cm/s 103 cm/s MV A velocity: 97.40 cm/s 70.3 cm/s MV E/A ratio:  0.92       1.5  Mihai Croitoru MD Electronically signed by Sanda Klein MD Signature Date/Time: 04/27/2019/4:23:12 PM    Final       Medications:     Current Medications: . [MAR Hold] aspirin  81 mg Oral Daily  . [MAR Hold] atorvastatin  80 mg Oral q1800  . [MAR Hold] Chlorhexidine Gluconate Cloth  6 each Topical Daily  . [MAR Hold] enoxaparin (LOVENOX) injection  40 mg Subcutaneous Q24H  . [MAR Hold] nicotine  21 mg Transdermal Daily  . [MAR Hold] sodium chloride flush  3 mL Intravenous Q12H  . [MAR Hold] sodium chloride flush  3 mL Intravenous Q12H  . [MAR Hold] ticagrelor  90 mg Oral BID     Infusions: . [MAR Hold] sodium chloride    . [MAR Hold] sodium chloride    . sodium chloride 10 mL/hr (04/27/19 2254)        Assessment/Plan   1. Acute systolic HF/cardiogenic shock - EF 30-35% with apical aneurysm. RV ok. - RHC number as above. Outputs are marginal but not profound shock - Will support with milrinone. Start digoxin - Follow swan #s closely. - If stable, start spiro and ARB in am - No diuretics or b-blockers currently - Low threshold to consider mechanical support   2. CAD with acute anterior MI - s/p PCI LAD - hac CTO RCA with LCX -> RCA collaterals - continue DAPT/statin  - no b-blocker with shock - CR to see  3. Tobacco use - discussed ned for cessation  CRITICAL CARE Performed by: Glori Bickers  Total critical care time: 60 minutes  Critical care time was exclusive of separately billable procedures and treating other patients.  Critical care was necessary to treat or prevent imminent or  life-threatening deterioration.  Critical care was time spent personally by me (independent of midlevel providers or residents) on the following activities: development of treatment plan with patient and/or surrogate as well as nursing, discussions with consultants, evaluation of patient's response to treatment, examination of patient, obtaining history from patient or surrogate, ordering and performing treatments and interventions, ordering and review of laboratory studies, ordering and review of radiographic studies, pulse oximetry and re-evaluation of patient's condition.    Length of Stay: 0  Glori Bickers, MD  04/27/2019, 11:05 PM  Advanced Heart Failure Team Pager [phone] (M-F; 7a - 4p)  Please contact [facility] for night-coverage after hours (4p -7a ) and weekends on [email or web address]

## 2019-04-27 NOTE — Progress Notes (Signed)
Chaplain responded to Code Stemi.  Chaplain was able to engage in initial visit with Brandon [name] and offer prayer and support.    Chaplain will follow-up.

## 2019-04-27 NOTE — Progress Notes (Signed)
Urgent echo reviewed, EF low 30-35% as expected, moderate AS. No significant pericardial effusion. His breathing has improved. BP stable. 101/77.

## 2019-04-28 ENCOUNTER — Other Ambulatory Visit: Payer: Self-pay

## 2019-04-28 DIAGNOSIS — I2102 ST elevation (STEMI) myocardial infarction involving left anterior descending coronary artery: Secondary | ICD-10-CM

## 2019-04-28 LAB — CBC
HCT: 44.6 % (ref 39.0–52.0)
Hemoglobin: 15.9 g/dL (ref 13.0–17.0)
MCH: 31.7 pg (ref 26.0–34.0)
MCHC: 35.7 g/dL (ref 30.0–36.0)
MCV: 89 fL (ref 80.0–100.0)
Platelets: 228 10*3/uL (ref 150–400)
RBC: 5.01 MIL/uL (ref 4.22–5.81)
RDW: 13.5 % (ref 11.5–15.5)
WBC: 18.8 10*3/uL — ABNORMAL HIGH (ref 4.0–10.5)
nRBC: 0 % (ref 0.0–0.2)

## 2019-04-28 LAB — COOXEMETRY PANEL
Carboxyhemoglobin: 2.2 % — ABNORMAL HIGH (ref 0.5–1.5)
Carboxyhemoglobin: 2.2 % — ABNORMAL HIGH (ref 0.5–1.5)
Methemoglobin: 0.8 % (ref 0.0–1.5)
Methemoglobin: 0.8 % (ref 0.0–1.5)
O2 Saturation: 83.6 %
O2 Saturation: 76.2 %
Total hemoglobin: 14.7 g/dL (ref 12.0–16.0)
Total hemoglobin: 15.4 g/dL (ref 12.0–16.0)

## 2019-04-28 LAB — BASIC METABOLIC PANEL
Anion gap: 12 (ref 5–15)
BUN: 16 mg/dL (ref 8–23)
CO2: 20 mmol/L — ABNORMAL LOW (ref 22–32)
Calcium: 9.2 mg/dL (ref 8.9–10.3)
Chloride: 100 mmol/L (ref 98–111)
Creatinine, Ser: 0.89 mg/dL (ref 0.61–1.24)
GFR calc Af Amer: >60 mL/min (ref >60)
GFR calc non Af Amer: >60 mL/min (ref >60)
Glucose, Bld: 142 mg/dL — ABNORMAL HIGH (ref 70–99)
Potassium: 4.2 mmol/L (ref 3.5–5.1)
Sodium: 132 mmol/L — ABNORMAL LOW (ref 135–145)

## 2019-04-28 LAB — POCT I-STAT EG7
Acid-base deficit: 1 mmol/L (ref 0.0–2.0)
Bicarbonate: 23.2 mmol/L (ref 20.0–28.0)
Bicarbonate: 23.6 mmol/L (ref 20.0–28.0)
Calcium, Ion: 1.2 mmol/L (ref 1.15–1.40)
Calcium, Ion: 1.21 mmol/L (ref 1.15–1.40)
HCT: 47 % (ref 39.0–52.0)
HCT: 48 % (ref 39.0–52.0)
Hemoglobin: 16 g/dL (ref 13.0–17.0)
Hemoglobin: 16.3 g/dL (ref 13.0–17.0)
O2 Saturation: 61 %
O2 Saturation: 65 %
Potassium: 4 mmol/L (ref 3.5–5.1)
Potassium: 4 mmol/L (ref 3.5–5.1)
Sodium: 131 mmol/L — ABNORMAL LOW (ref 135–145)
Sodium: 131 mmol/L — ABNORMAL LOW (ref 135–145)
TCO2: 24 mmol/L (ref 22–32)
TCO2: 25 mmol/L (ref 22–32)
pCO2, Ven: 35.1 mmHg — ABNORMAL LOW (ref 44.0–60.0)
pCO2, Ven: 35.9 mmHg — ABNORMAL LOW (ref 44.0–60.0)
pH, Ven: 7.419 (ref 7.250–7.430)
pH, Ven: 7.435 — ABNORMAL HIGH (ref 7.250–7.430)
pO2, Ven: 31 mmHg — CL (ref 32.0–45.0)
pO2, Ven: 33 mmHg (ref 32.0–45.0)

## 2019-04-28 LAB — LIPID PANEL
Cholesterol: 171 mg/dL (ref 0–200)
HDL: 63 mg/dL (ref >40)
LDL Cholesterol: 93 mg/dL (ref 0–99)
Total CHOL/HDL Ratio: 2.7 RATIO
Triglycerides: 73 mg/dL (ref <150)
VLDL: 15 mg/dL (ref 0–40)

## 2019-04-28 LAB — LACTIC ACID, PLASMA: Lactic Acid, Venous: 1.4 mmol/L (ref 0.5–1.9)

## 2019-04-28 LAB — MAGNESIUM: Magnesium: 1.9 mg/dL (ref 1.7–2.4)

## 2019-04-28 MED ORDER — MILRINONE LACTATE IN DEXTROSE 20-5 MG/100ML-% IV SOLN
0.1000 ug/kg/min | INTRAVENOUS | Status: DC
  Administered 2019-04-28 – 2019-04-29 (×2): 0.2 ug/kg/min via INTRAVENOUS
  Administered 2019-04-30: 0.1 ug/kg/min via INTRAVENOUS
  Filled 2019-04-28 (×3): qty 100

## 2019-04-28 MED ORDER — TAMSULOSIN HCL 0.4 MG PO CAPS
0.4000 mg | ORAL_CAPSULE | Freq: Every day | ORAL | Status: DC
  Administered 2019-04-28 – 2019-05-04 (×7): 0.4 mg via ORAL
  Filled 2019-04-28 (×7): qty 1

## 2019-04-28 MED ORDER — SPIRONOLACTONE 12.5 MG HALF TABLET
12.5000 mg | ORAL_TABLET | Freq: Every day | ORAL | Status: DC
  Administered 2019-04-28 – 2019-05-01 (×4): 12.5 mg via ORAL
  Filled 2019-04-28 (×4): qty 1

## 2019-04-28 MED ORDER — FENTANYL CITRATE (PF) 100 MCG/2ML IJ SOLN
INTRAMUSCULAR | Status: AC
  Administered 2019-04-28: 25 ug via INTRAVENOUS
  Filled 2019-04-28: qty 2

## 2019-04-28 MED FILL — Nitroglycerin IV Soln 100 MCG/ML in D5W: INTRA_ARTERIAL | Qty: 10 | Status: AC

## 2019-04-28 NOTE — Progress Notes (Addendum)
Advanced Heart Failure Rounding Note  PCP-Cardiologist: [name], MD  AHF: Dr. [name]   Subjective:    63 y/o smoker (originally from [place], [place]) with h/o CAD s/p previous MI, HTN, and HLD admitted for acute anterior STEMI. Cath w/ chronically occluded RCA (with collaterals from RCA) and thrombotic occlusion of proximal LAD. Underwent PCI of LAD.   ECHO EF 30-35% with apical aneursym. Mod AS. Minimal MR. No evidence VSD.   Developed recurrent CP last night and cardiogenic shock. Taken back to cath lab by Dr. [name] for Brandon [name] and possible mechanical support.   Swan numbers this am CVP 5 PA 29/17 (21) PCWP 15 Thermo 5.4/2.6 Fick 5.7/2.6  Started on milrinone 0.25. Co-ox resulted at 84% this am. Sending repeat co-ox. BP soft low 90s. NSR/sinus tach on tele 90s-low 100s. NSVT on tele up to 10 beats. No active CP.   WBC ct elevated 16.7>>18.8 but afebrile.    Objective:   Weight Range: 89.5 kg Body mass index is 28.31 kg/m.   Vital Signs:   Temp:  [98.1 F (36.7 C)-99.1 F (37.3 C)] 99.1 F (37.3 C) (12/16 0700) Pulse Rate:  [0-119] 90 (12/16 0700) Resp:  [0-34] 25 (12/16 0700) BP: (84-117)/(64-87) 92/71 (12/16 0700) SpO2:  [92 %-100 %] 97 % (12/16 0700) Weight:  [89.5 kg] 89.5 kg (12/16 0354)    Weight change: Filed Weights   04/28/19 0354  Weight: 89.5 kg    Intake/Output:   Intake/Output Summary (Last 24 hours) at 04/28/2019 0758 Last data filed at 04/28/2019 0600 Gross per 24 hour  Intake 160.55 ml  Output 775 ml  Net -614.45 ml      Physical Exam    General:  Fatigue appearing WM. No resp difficulty HEENT: Normal Neck: Supple. JVP . Carotids 2+ bilat; no bruits. No lymphadenopathy or thyromegaly appreciated. Cor: PMI nondisplaced. Regular rate & rhythm. No rubs, gallops or murmurs. Lungs: Clear Abdomen: Soft, nontender, nondistended. No hepatosplenomegaly. No bruits or masses. Good bowel sounds. Extremities: No cyanosis, clubbing,  rash, edema Neuro: Alert & orientedx3, cranial nerves grossly intact. moves all 4 extremities w/o difficulty. Affect pleasant   Telemetry   NSR/sinus tach 90s-low 100s. NSVT up to 10 beats   EKG    NSR w/ inferior and anterior STE  Labs    CBC Recent Labs    04/27/19 2036 04/28/19 0100  WBC 16.7* 18.8*  NEUTROABS 14.6*  --   HGB 16.0 15.9  HCT 45.5 44.6  MCV 89.6 89.0  PLT 251 001   Basic Metabolic Panel Recent Labs    04/27/19 2148 04/28/19 0100  NA 129* 132*  K 3.9 4.2  CL 99 100  CO2 20* 20*  GLUCOSE 148* 142*  BUN 15 16  CREATININE 0.95 0.89  CALCIUM 9.0 9.2  MG  --  1.9   Liver Function Tests Recent Labs    04/27/19 1208 04/27/19 2036  AST 25 480*  ALT 25 103*  ALKPHOS 88 91  BILITOT 0.6 1.2  PROT 6.9 7.0  ALBUMIN 3.7 3.6   No results for input(s): LIPASE, AMYLASE in the last 72 hours. Cardiac Enzymes No results for input(s): CKTOTAL, CKMB, CKMBINDEX, TROPONINI in the last 72 hours.  BNP: BNP (last 3 results) No results for input(s): BNP in the last 8760 hours.  ProBNP (last 3 results) No results for input(s): PROBNP in the last 8760 hours.   D-Dimer No results for input(s): DDIMER in the last 72 hours. Hemoglobin A1C Recent Labs  04/27/19 2036  HGBA1C 6.0*   Fasting Lipid Panel Recent Labs    04/28/19 0100  CHOL 171  HDL 63  LDLCALC 93  TRIG 73  CHOLHDL 2.7   Thyroid Function Tests Recent Labs    04/27/19 2036  TSH 0.889    Other results:   Imaging    CARDIAC CATHETERIZATION  Result Date: 04/27/2019 1. Normal right heart pressures 2. Mildly elevated LV filling pressures. 3. Cardiac index of 1.9. Discussed with Dr [name]. Will leave Swan in place and monitor. Start Milrinone for inotropic support.   CARDIAC CATHETERIZATION  Result Date: 04/27/2019  Prox RCA to Mid RCA lesion is 100% stenosed. THis is a chronic total occlusion. Left to right collaterals.  Prox Cx lesion is 50% stenosed.  Mid LAD lesion  is 100% stenosed.  A drug-eluting stent was successfully placed using a STENT SYNERGY DES 3.5X16.  Post intervention, there is a 0% residual stenosis.  LV end diastolic pressure is moderately elevated.  There is no aortic valve stenosis.  He will need echo and aggressive medical therapy for LV dysfunction. Watch in ICU.  Smoking cessation recommended.   ECHOCARDIOGRAM COMPLETE  Result Date: 04/27/2019   ECHOCARDIOGRAM REPORT   Patient Name:   Brandon [name] Date of Exam: 04/27/2019 Medical Rec #:  [id number]      Height:       70.0 in Accession #:    [id number]     Weight:       200.0 lb Date of Birth:  10-16-1955       BSA:          2.09 m Patient Age:    29 years       BP:           90/74 mmHg Patient Gender: M              HR:           101 bpm. Exam Location:  Inpatient Procedure: 2D Echo, Cardiac Doppler and Color Doppler Indications:    Chest pain  History:        Patient has prior history of Echocardiogram examinations, most                 recent 12/12/2014. CAD and Previous Myocardial Infarction, Aortic                 Valve Disease; Risk Factors:Hypertension, Dyslipidemia and                 Current Smoker. Lumbar stenosis.  Sonographer:    Dustin Flock Referring Phys: 71 RHONDA G BARRETT  Sonographer Comments: Lumbar stenosis. IMPRESSIONS  1. Left ventricular ejection fraction, by visual estimation, is 30 to 35%. The left ventricle has moderately decreased function. There is no left ventricular hypertrophy.  2. Moderate hypokinesis of the left ventricular, mid-apical inferior wall.  3. Severe hypokinesis of the left ventricular, mid-apical anteroseptal wall, anterior wall and apical segment.  4. Elevated left atrial pressure.  5. Left ventricular diastolic parameters are consistent with Grade I diastolic dysfunction (impaired relaxation).  6. The left ventricle demonstrates regional wall motion abnormalities.  7. Global right ventricle has normal systolic function.The right ventricular size is  normal. No increase in right ventricular wall thickness.  8. Left atrial size was mildly dilated.  9. Right atrial size was normal. 10. Moderate mitral annular calcification. 11. The mitral valve is normal in structure. No evidence of mitral valve regurgitation. 12. The aortic valve  is tricuspid. Aortic valve regurgitation is not visualized. Moderate aortic valve stenosis. The aortic valve gradients are underestimated due to the low left ventricular systolic function. 13. The pulmonic valve was not well visualized. Pulmonic valve regurgitation is not visualized. 14. The inferior vena cava is normal in size with greater than 50% respiratory variability, suggesting right atrial pressure of 3 mmHg. 15. The tricuspid valve is normal in structure. Tricuspid valve regurgitation is not demonstrated. 16. TR signal is inadequate for assessing pulmonary artery systolic pressure. 22. Compared to 12/12/2014, there is a marked reduction in left ventricular systolic function due to new wall motion abnormalities in the LAD and right coronary artery distribution. FINDINGS  Left Ventricle: Left ventricular ejection fraction, by visual estimation, is 30 to 35%. The left ventricle has moderately decreased function. Severe hypokinesis of the left ventricular, mid-apical anteroseptal wall, anterior wall and apical segment. Moderate hypokinesis of the left ventricular, mid-apical inferior wall. The left ventricle demonstrates regional wall motion abnormalities. The left ventricular internal cavity size was the left ventricle is normal in size. There is no left ventricular hypertrophy. Left ventricular diastolic parameters are consistent with Grade I diastolic dysfunction (impaired relaxation). Elevated left atrial pressure. Right Ventricle: The right ventricular size is normal. No increase in right ventricular wall thickness. Global RV systolic function is has normal systolic function. Left Atrium: Left atrial size was mildly dilated.  Right Atrium: Right atrial size was normal in size Pericardium: There is no evidence of pericardial effusion. Mitral Valve: The mitral valve is normal in structure. Moderate mitral annular calcification. No evidence of mitral valve regurgitation. Tricuspid Valve: The tricuspid valve is normal in structure. Tricuspid valve regurgitation is not demonstrated. Aortic Valve: The aortic valve is tricuspid. . There is moderate thickening and moderate calcification of the aortic valve. Aortic valve regurgitation is not visualized. Moderate aortic stenosis is present. There is moderate thickening of the aortic valve. There is moderate calcification of the aortic valve. Aortic valve mean gradient measures 10.1 mmHg. Aortic valve peak gradient measures 17.6 mmHg. Aortic valve area, by VTI measures 1.34 cm. Pulmonic Valve: The pulmonic valve was not well visualized. Pulmonic valve regurgitation is not visualized. Pulmonic regurgitation is not visualized. Aorta: The aortic root and ascending aorta are structurally normal, with no evidence of dilitation. Venous: The inferior vena cava is normal in size with greater than 50% respiratory variability, suggesting right atrial pressure of 3 mmHg. IAS/Shunts: No atrial level shunt detected by color flow Doppler.  LEFT VENTRICLE PLAX 2D LVIDd:         5.40 cm  Diastology LVIDs:         4.40 cm  LV e' lateral:   6.09 cm/s LV PW:         1.00 cm  LV E/e' lateral: 14.6 LV IVS:        1.10 cm  LV e' medial:    5.33 cm/s LVOT diam:     2.28 cm  LV E/e' medial:  16.7 LV SV:         54 ml LV SV Index:   25.10 LVOT Area:     4.08 cm  RIGHT VENTRICLE RV Basal diam:  2.60 cm RV S prime:     10.10 cm/s TAPSE (M-mode): 2.6 cm LEFT ATRIUM             Index       RIGHT ATRIUM           Index LA diam:  3.90 cm 1.87 cm/m  RA Area:     11.20 cm LA Vol (A2C):   43.1 ml 20.65 ml/m RA Volume:   23.90 ml  11.45 ml/m LA Vol (A4C):   58.2 ml 27.88 ml/m LA Biplane Vol: 54.7 ml 26.21 ml/m  AORTIC  VALVE AV Area (Vmax):    1.44 cm AV Area (Vmean):   1.51 cm AV Area (VTI):     1.34 cm AV Vmax:           209.92 cm/s AV Vmean:          149.617 cm/s AV VTI:            0.427 m AV Peak Grad:      17.6 mmHg AV Mean Grad:      10.1 mmHg LVOT Vmax:         74.20 cm/s LVOT Vmean:        55.500 cm/s LVOT VTI:          0.140 m LVOT/AV VTI ratio: 0.33  AORTA Ao Root diam: 3.70 cm MITRAL VALVE MV Area (PHT): 9.85 cm             SHUNTS MV PHT:        22.33 msec           Systemic VTI:  0.14 m MV Decel Time: 77 msec              Systemic Diam: 2.28 cm MV E velocity: 89.20 cm/s 103 cm/s MV A velocity: 97.40 cm/s 70.3 cm/s MV E/A ratio:  0.92       1.5  Mihai Croitoru MD Electronically signed by Sanda Klein MD Signature Date/Time: 04/27/2019/4:23:12 PM    Final       Medications:     Scheduled Medications: . aspirin  81 mg Oral Daily  . atorvastatin  80 mg Oral q1800  . Chlorhexidine Gluconate Cloth  6 each Topical Daily  . digoxin  0.125 mg Oral Daily  . enoxaparin (LOVENOX) injection  40 mg Subcutaneous Q24H  . nicotine  21 mg Transdermal Daily  . sodium chloride flush  3 mL Intravenous Q12H  . sodium chloride flush  3 mL Intravenous Q12H  . sodium chloride flush  3 mL Intravenous Q12H  . ticagrelor  90 mg Oral BID     Infusions: . sodium chloride    . sodium chloride    . sodium chloride    . milrinone 0.25 mcg/kg/min (04/28/19 0600)     PRN Medications:  sodium chloride, sodium chloride, sodium chloride, acetaminophen, ALPRAZolam, alum & mag hydroxide-simeth, fentaNYL (SUBLIMAZE) injection, nitroGLYCERIN, ondansetron (ZOFRAN) IV, sodium chloride flush, sodium chloride flush, sodium chloride flush, zolpidem    Patient Profile   63 y/o smoker (originally from [place], [place]) with h/o CAD s/p previous MI, HTN, and HLD admitted for acute anterior STEMI. Cath w/ chronically occluded RCA (with collaterals from RCA) and thrombotic occlusion of proximal LAD. Underwent PCI of LAD. Developed  cardiogenic shock requiring milrinone.  ECHO EF 30-35% with apical aneursym. Mod AS. Minimal MR. No evidence VSD.   Assessment/Plan    1. Acute systolic HF/cardiogenic shock - EF 30-35% with apical aneurysm. RV ok. - RHC number as above. Outputs are marginal but not profound shock - On milrinone 0.25. Co-ox resulted at 84%. Checking repeat co-ox - Continue digoxin  - BP remains too soft for spiro and ARB currently - No diuretics or b-blockers currently - Low threshold to consider mechanical support  - ?  [facility] at D/c  2. CAD with acute anterior MI - s/p PCI LAD - hac CTO RCA with LCX -> RCA collaterals - continue DAPT/statin  - no b-blocker with shock - CR to see  3. Tobacco use - cessation advised   4. Leukocytosis: WBC 10>>16>>18. Afebrile. ?reactive.  - follow closely.     Length of Stay: 1  Lyda Jester, PA-C  04/28/2019, 7:58 AM  Advanced Heart Failure Team Pager [phone] (M-F; 7a - 4p)  Please contact [facility] for night-coverage after hours (4p -7a ) and weekends on [email or web address]  Agree with above.   Denies CP. Mild SOB. No orthopnea or PND Luiz Blare numbers done personally at bedside. Filling pressures and outputs ok on milrinone 0.25  General:  Well appearing. No resp difficulty HEENT: normal Neck: supple. no JVD. Carotids 2+ bilat; no bruits. No lymphadenopathy or thryomegaly appreciated. Cor: PMI nondisplaced. Regular rate & rhythm. 2/6 MR Lungs: clear Abdomen: soft, nontender, nondistended. No hepatosplenomegaly. No bruits or masses. Good bowel sounds. Extremities: no cyanosis, clubbing, rash, edema RFV swan site ok  Neuro: alert & orientedx3, cranial nerves grossly intact. moves all 4 extremities w/o difficulty. Affect pleasant  Hemodynamics improved on milrinone. Volume status ok. BP soft. Will drop milrinone to 0.20. Start spiro. Hold diuretics. Hopefully can add losartan tonigh. No b-blocker yet with shock. Likely pull swan in am and  ambulate.   Fever likely due to MI. +/- ATX. Give IS.   CRITICAL CARE Performed by: Glori Bickers  Total critical care time: 35 minutes  Critical care time was exclusive of separately billable procedures and treating other patients.  Critical care was necessary to treat or prevent imminent or life-threatening deterioration.  Critical care was time spent personally by me (independent of midlevel providers or residents) on the following activities: development of treatment plan with patient and/or surrogate as well as nursing, discussions with consultants, evaluation of patient's response to treatment, examination of patient, obtaining history from patient or surrogate, ordering and performing treatments and interventions, ordering and review of laboratory studies, ordering and review of radiographic studies, pulse oximetry and re-evaluation of patient's condition.  Glori Bickers, MD  8:58 AM

## 2019-04-28 NOTE — Progress Notes (Signed)
Chaplain responded to page for support. Brandon [name] is having a rough day today emotionally. Taelor's family consist of a sister. Brandon [name] acknowledges a desire to give up on today, and begin again tomorrow. He mentioned several times that he is a positive person. Religion is not Brandon [name]'s cup of tea. Brandon [name] was raised Catholic but does not currently participate in organized religion. Brandon [name] is a very man with a lot of wisdom and compassion towards other people. Brandon [name] found support in conversation with chaplain. Chaplain puts no pressures around religion and is happy to visit for conversation as needed for emotional support. Chaplains available for support 24/7 via pager [phone].  Chaplain Resident, Evelene Croon, M Div

## 2019-04-28 NOTE — Progress Notes (Signed)
EKG CRITICAL VALUE     12 lead EKG performed.  Critical value noted. Shelby Dubin, RN notified.   Asencion Gowda, CCT 04/28/2019 7:55 AM

## 2019-04-28 NOTE — Progress Notes (Addendum)
Upon walking into the pt's room at approx. 1930, pt complained about SOB. I ensured that his O2 sats were WDL and monitored him closely. The next time I entered his room around 2000, he mentioned chest pain that felt like "heavy pressure" and rated it 6-7/10 for pain. In addition, his BP was low. I recorded a new EKG and called cardiology around 2015. The MD came to the bedside to examine the pt. Ultimately, it was decided the pt would go back to cath lab for a R heart cath. Consent was given by the pt and witnessed by this RN (document in paper chart). Pt was CHG'd and given Brilinta prior to cath lab arrival. Bedside report was given to cath lab RNs.   When the pt arrived back to 2H02, he was hemodynamically stable and still experiencing chest pain. R swan ganz catheter was placed (see flowsheet at [place] for numbers). RN will continue to monitor pt closely.

## 2019-04-28 NOTE — Plan of Care (Signed)
  Problem: Health Behavior/Discharge Planning: Goal: Ability to manage health-related needs will improve Outcome: Progressing   Problem: Education: Goal: Understanding of CV disease, CV risk reduction, and recovery process will improve Outcome: Progressing   Problem: Activity: Goal: Ability to return to baseline activity level will improve Outcome: Progressing   Problem: Cardiovascular: Goal: Ability to achieve and maintain adequate cardiovascular perfusion will improve Outcome: Progressing Goal: Vascular access site(s) Level 0-1 will be maintained Outcome: Progressing   Problem: Education: Goal: Knowledge of General Education information will improve Description: Including pain rating scale, medication(s)/side effects and non-pharmacologic comfort measures Outcome: Progressing   Problem: Clinical Measurements: Goal: Ability to maintain clinical measurements within normal limits will improve Outcome: Progressing Goal: Diagnostic test results will improve Outcome: Progressing Goal: Respiratory complications will improve Outcome: Progressing   Problem: Activity: Goal: Risk for activity intolerance will decrease Outcome: Progressing   Problem: Nutrition: Goal: Adequate nutrition will be maintained Outcome: Progressing   Problem: Pain Managment: Goal: General experience of comfort will improve Outcome: Progressing   Problem: Safety: Goal: Ability to remain free from injury will improve Outcome: Progressing   Problem: Skin Integrity: Goal: Risk for impaired skin integrity will decrease Outcome: Progressing   Problem: Clinical Measurements: Goal: Will remain free from infection Outcome: Not Progressing Note: WBC trending up   Problem: Coping: Goal: Level of anxiety will decrease Outcome: Not Progressing

## 2019-04-29 ENCOUNTER — Inpatient Hospital Stay: Payer: Self-pay

## 2019-04-29 DIAGNOSIS — R570 Cardiogenic shock: Secondary | ICD-10-CM

## 2019-04-29 LAB — BASIC METABOLIC PANEL
Anion gap: 11 (ref 5–15)
BUN: 20 mg/dL (ref 8–23)
CO2: 20 mmol/L — ABNORMAL LOW (ref 22–32)
Calcium: 7.9 mg/dL — ABNORMAL LOW (ref 8.9–10.3)
Chloride: 100 mmol/L (ref 98–111)
Creatinine, Ser: 0.83 mg/dL (ref 0.61–1.24)
GFR calc Af Amer: >60 mL/min (ref >60)
GFR calc non Af Amer: >60 mL/min (ref >60)
Glucose, Bld: 101 mg/dL — ABNORMAL HIGH (ref 70–99)
Potassium: 4.1 mmol/L (ref 3.5–5.1)
Sodium: 131 mmol/L — ABNORMAL LOW (ref 135–145)

## 2019-04-29 LAB — CBC
HCT: 39.6 % (ref 39.0–52.0)
Hemoglobin: 13.6 g/dL (ref 13.0–17.0)
MCH: 31.7 pg (ref 26.0–34.0)
MCHC: 34.3 g/dL (ref 30.0–36.0)
MCV: 92.3 fL (ref 80.0–100.0)
Platelets: 164 10*3/uL (ref 150–400)
RBC: 4.29 MIL/uL (ref 4.22–5.81)
RDW: 13.5 % (ref 11.5–15.5)
WBC: 12.8 10*3/uL — ABNORMAL HIGH (ref 4.0–10.5)
nRBC: 0 % (ref 0.0–0.2)

## 2019-04-29 LAB — COOXEMETRY PANEL
Carboxyhemoglobin: 2 % — ABNORMAL HIGH (ref 0.5–1.5)
Methemoglobin: 0.9 % (ref 0.0–1.5)
O2 Saturation: 73.2 %
Total hemoglobin: 13.9 g/dL (ref 12.0–16.0)

## 2019-04-29 MED ORDER — SODIUM CHLORIDE 0.9 % NICU IV INFUSION SIMPLE
500.0000 mL | INJECTION | Freq: Once | INTRAVENOUS | Status: AC
  Administered 2019-04-29: 500 mL via INTRAVENOUS
  Filled 2019-04-29: qty 500

## 2019-04-29 MED ORDER — MIDODRINE HCL 5 MG PO TABS
2.5000 mg | ORAL_TABLET | Freq: Three times a day (TID) | ORAL | Status: DC
  Administered 2019-04-29 – 2019-05-01 (×5): 2.5 mg via ORAL
  Filled 2019-04-29 (×6): qty 1

## 2019-04-29 MED ORDER — FUROSEMIDE 20 MG PO TABS
20.0000 mg | ORAL_TABLET | Freq: Every day | ORAL | Status: DC
  Administered 2019-04-29 – 2019-05-01 (×3): 20 mg via ORAL
  Filled 2019-04-29 (×3): qty 1

## 2019-04-29 NOTE — Progress Notes (Signed)
EKG CRITICAL VALUE     12 lead EKG performed.  Critical value noted. , RN notified.   Naliya Gish, CCT 04/29/2019 7:18 AM

## 2019-04-29 NOTE — Progress Notes (Signed)
Spoke with charge nurse, unable to place PICC today. Patient has two functioning PIVs. Plans for PICC insertion tomorrow.

## 2019-04-29 NOTE — Progress Notes (Addendum)
Advanced Heart Failure Rounding Note  PCP-Cardiologist: [name], MD  AHF: Dr. [name]   Subjective:    63 y/o smoker (originally from [place], [place]) with h/o CAD s/p previous MI, HTN, and HLD admitted for acute anterior STEMI. Cath w/ chronically occluded RCA (with collaterals from RCA) and thrombotic occlusion of proximal LAD. Underwent PCI of LAD.   ECHO EF 30-35% with apical aneursym. Mod AS. Minimal MR. No evidence VSD.   Developed recurrent CP post PCI and cardiogenic shock. Taken back to cath lab by Dr. [name] for [place] and possible mechanical support.  Outputs were marginal but not profound shock. Started on milrinone and swan placed to monitor hemodynamics.   Swan numbers this am CVP 5-6 PA 38/27 (28) PCWP 15 CO 7.18 CI 3.43  Remains on milrinone but weaning down. Co-ox 73%. BP remains soft. SBP in the 90s. MAPs low 70s.   Sinus tach 110s. Less frequent NSVT.   WBC ct trending down, 18.8 >>12.8 afebrile.   Feels good today. No CP or dyspnea. Has been waking up in middle of night occassionally gasping (occurs when laying supine, also snores and concerned he has OSA. He has never had a sleep study).  Objective:   Weight Range: 89.4 kg Body mass index is 26.73 kg/m.   Vital Signs:   Temp:  [97.9 F (36.6 C)-100.2 F (37.9 C)] 99.5 F (37.5 C) (12/17 0700) Pulse Rate:  [60-116] 102 (12/17 0700) Resp:  [4-28] 22 (12/17 0700) BP: (78-100)/(51-74) 95/67 (12/17 0700) SpO2:  [93 %-98 %] 95 % (12/17 0700) Weight:  [89.4 kg-89.5 kg] 89.4 kg (12/17 0400) Last BM Date: 04/26/19  Weight change: Filed Weights   04/28/19 0354 04/28/19 1307 04/29/19 0400  Weight: 89.5 kg 89.5 kg 89.4 kg    Intake/Output:   Intake/Output Summary (Last 24 hours) at 04/29/2019 0738 Last data filed at 04/29/2019 0700 Gross per 24 hour  Intake 365.11 ml  Output 500 ml  Net -134.89 ml      Physical Exam    CVP 5-6 General:  Well appearing WM. No resp difficulty HEENT:  Normal Neck: Supple. JVP . Carotids 2+ bilat; no bruits. No lymphadenopathy or thyromegaly appreciated. Cor: PMI nondisplaced. Regular tachy No rubs, gallops or murmurs. Lungs: Clear Abdomen: Soft, nontender, nondistended. No hepatosplenomegaly. No bruits or masses. Good bowel sounds. Extremities: No cyanosis, clubbing, rash, edema Neuro: Alert & orientedx3, cranial nerves grossly intact. moves all 4 extremities w/o difficulty. Affect pleasant   Telemetry   Sinus tach 110s Personally reviewed   EKG    NSR w/ inferior and anterior STE  Labs    CBC Recent Labs    04/27/19 2036 04/28/19 0100 04/29/19 0447  WBC 16.7* 18.8* 12.8*  NEUTROABS 14.6*  --   --   HGB 16.0 15.9 13.6  HCT 45.5 44.6 39.6  MCV 89.6 89.0 92.3  PLT 251 228 096   Basic Metabolic Panel Recent Labs    04/28/19 0100 04/29/19 0412  NA 132* 131*  K 4.2 4.1  CL 100 100  CO2 20* 20*  GLUCOSE 142* 101*  BUN 16 20  CREATININE 0.89 0.83  CALCIUM 9.2 7.9*  MG 1.9  --    Liver Function Tests Recent Labs    04/27/19 1208 04/27/19 2036  AST 25 480*  ALT 25 103*  ALKPHOS 88 91  BILITOT 0.6 1.2  PROT 6.9 7.0  ALBUMIN 3.7 3.6   No results for input(s): LIPASE, AMYLASE in the last 72 hours.  Cardiac Enzymes No results for input(s): CKTOTAL, CKMB, CKMBINDEX, TROPONINI in the last 72 hours.  BNP: BNP (last 3 results) No results for input(s): BNP in the last 8760 hours.  ProBNP (last 3 results) No results for input(s): PROBNP in the last 8760 hours.   D-Dimer No results for input(s): DDIMER in the last 72 hours. Hemoglobin A1C Recent Labs    04/27/19 2036  HGBA1C 6.0*   Fasting Lipid Panel Recent Labs    04/28/19 0100  CHOL 171  HDL 63  LDLCALC 93  TRIG 73  CHOLHDL 2.7   Thyroid Function Tests Recent Labs    04/27/19 2036  TSH 0.889    Other results:   Imaging    No results found.   Medications:     Scheduled Medications: . aspirin  81 mg Oral Daily  .  atorvastatin  80 mg Oral q1800  . Chlorhexidine Gluconate Cloth  6 each Topical Daily  . digoxin  0.125 mg Oral Daily  . enoxaparin (LOVENOX) injection  40 mg Subcutaneous Q24H  . nicotine  21 mg Transdermal Daily  . sodium chloride flush  3 mL Intravenous Q12H  . sodium chloride flush  3 mL Intravenous Q12H  . sodium chloride flush  3 mL Intravenous Q12H  . spironolactone  12.5 mg Oral Daily  . tamsulosin  0.4 mg Oral Daily  . ticagrelor  90 mg Oral BID    Infusions: . sodium chloride    . sodium chloride    . sodium chloride    . milrinone 0.2 mcg/kg/min (04/29/19 0700)    PRN Medications: sodium chloride, sodium chloride, sodium chloride, acetaminophen, ALPRAZolam, alum & mag hydroxide-simeth, fentaNYL (SUBLIMAZE) injection, nitroGLYCERIN, ondansetron (ZOFRAN) IV, sodium chloride flush, sodium chloride flush, sodium chloride flush, zolpidem    Patient Profile   63 y/o smoker (originally from [place], [place]) with h/o CAD s/p previous MI, HTN, and HLD admitted for acute anterior STEMI. Cath w/ chronically occluded RCA (with collaterals from RCA) and thrombotic occlusion of proximal LAD. Underwent PCI of LAD. Developed cardiogenic shock requiring milrinone.  ECHO EF 30-35% with apical aneursym. Mod AS. Minimal MR. No evidence VSD.   Assessment/Plan    1. Acute systolic HF/cardiogenic shock - EF 30-35% with apical aneurysm. RV ok. - RHC number as above. Outputs are marginal but not profound shock - On milrinone 0.20. Co-ox 73% today. Will wean down milrinone further to 0.10 mcg. - Continue digoxin 0.125 - Continue Spiro 12.5 mg daily - BP remains too soft for ARB/ARNi currently - No b-blocker yet w/ shock  - Volume stable, no loop diuretic requirements currently - Pull swan cath today.   2. CAD with acute anterior MI - s/p PCI LAD - hac CTO RCA with LCX -> RCA collaterals - no s/s angina - continue DAPT/statin  - no b-blocker with shock - Ambulate w/ CR today after swan  removal   3. Tobacco use - cessation advised   4. Leukocytosis: improving. WBC 18>>12. Afebrile. Likely 2/2 MI. +/- ATX. - encouraged to continue to use IS   Length of Stay: 2  Lyda Jester, PA-C  04/29/2019, 7:38 AM  Advanced Heart Failure Team Pager [phone] (M-F; 7a - 4p)  Please contact [facility] for night-coverage after hours (4p -7a ) and weekends on [email or web address]  Agree with above.   Swan in place. Luiz Blare numbers done personally. Outputs improved on milrinone. Filling pressures beginning to rise but still ok. Had some SOB last night. No CP.  General:  Well appearing. No resp difficulty HEENT: normal Neck: supple. no JVD. Carotids 2+ bilat; no bruits. No lymphadenopathy or thryomegaly appreciated. Cor: PMI nondisplaced. Regular rate & rhythm.3/6 MR Lungs: clear Abdomen: soft, nontender, nondistended. No hepatosplenomegaly. No bruits or masses. Good bowel sounds. Extremities: no cyanosis, clubbing, rash, edema RFV swan Neuro: alert & orientedx3, cranial nerves grossly intact. moves all 4 extremities w/o difficulty. Affect pleasant   Remains tenuous but improving. Will need to see if we can wean inotropes. Cut milrinone to 0.1. Start lasix 20 daily. Can pull swan. BP too low to add other meds. If develops recurrent HF symptoms will need PICC for co-ox and CVP monitoring. I have updated his sister by phone.   CRITICAL CARE Performed by: Glori Bickers  Total critical care time: 35 minutes  Critical care time was exclusive of separately billable procedures and treating other patients.  Critical care was necessary to treat or prevent imminent or life-threatening deterioration.  Critical care was time spent personally by me (independent of midlevel providers or residents) on the following activities: development of treatment plan with patient and/or surrogate as well as nursing, discussions with consultants, evaluation of patient's response to treatment,  examination of patient, obtaining history from patient or surrogate, ordering and performing treatments and interventions, ordering and review of laboratory studies, ordering and review of radiographic studies, pulse oximetry and re-evaluation of patient's condition.  Glori Bickers, MD  12:32 PM

## 2019-04-29 NOTE — Progress Notes (Signed)
STEMI noted on pt's AM EKG. Pt still had ongoing CP overnight. Cardiology notified and updated at [phone]; no new orders at this time. Will continue to monitor pt closely and relay information to day shift RN.

## 2019-04-29 NOTE — TOC Benefit Eligibility Note (Signed)
Transition of Care [facility]) Benefit Eligibility Note    Patient Details  Name: Brandon [name] MRN: [id number] Date of Birth: May 29, 1955   Medication/Dose: [name]  90 MG BID  Covered?: Yes  Tier: 3 Drug  Prescription [facility]age [facility] with Person/Company/Phone Number:: [employer]PHA  @  [facility] ZR #  [phone]  Co-Pay: $ [phone]  Prior Approval: No  Deductible: Unmet       [name] Phone Number: 04/29/2019, 3:10 PM

## 2019-04-29 NOTE — Care Management (Signed)
04-29-19 1309 Benefits Check submitted for Brilinta. Case Manager (CM) will make patient aware of cost once completed. CM will continue to follow for transition of care needs. Bethena Roys, RN, BSN Case Manager [phone]

## 2019-04-29 NOTE — Progress Notes (Signed)
Chaplain engaged in follow-up visit with Brandon [name].  Mr. Blubaugh expressed that today is a better day.  Chaplain offered support and affirmed Mr. Lottman's day yesterday and acknowledged the newness of today.   Chaplain will continue to follow-up.

## 2019-04-30 LAB — CBC
HCT: 37.6 % — ABNORMAL LOW (ref 39.0–52.0)
Hemoglobin: 13.2 g/dL (ref 13.0–17.0)
MCH: 32.1 pg (ref 26.0–34.0)
MCHC: 35.1 g/dL (ref 30.0–36.0)
MCV: 91.5 fL (ref 80.0–100.0)
Platelets: 155 10*3/uL (ref 150–400)
RBC: 4.11 MIL/uL — ABNORMAL LOW (ref 4.22–5.81)
RDW: 13.2 % (ref 11.5–15.5)
WBC: 9.6 10*3/uL (ref 4.0–10.5)
nRBC: 0 % (ref 0.0–0.2)

## 2019-04-30 LAB — BASIC METABOLIC PANEL
Anion gap: 9 (ref 5–15)
BUN: 16 mg/dL (ref 8–23)
CO2: 21 mmol/L — ABNORMAL LOW (ref 22–32)
Calcium: 8.2 mg/dL — ABNORMAL LOW (ref 8.9–10.3)
Chloride: 101 mmol/L (ref 98–111)
Creatinine, Ser: 0.78 mg/dL (ref 0.61–1.24)
GFR calc Af Amer: >60 mL/min (ref >60)
GFR calc non Af Amer: >60 mL/min (ref >60)
Glucose, Bld: 100 mg/dL — ABNORMAL HIGH (ref 70–99)
Potassium: 3.9 mmol/L (ref 3.5–5.1)
Sodium: 131 mmol/L — ABNORMAL LOW (ref 135–145)

## 2019-04-30 LAB — COOXEMETRY PANEL
Carboxyhemoglobin: 1.7 % — ABNORMAL HIGH (ref 0.5–1.5)
Methemoglobin: 1.1 % (ref 0.0–1.5)
O2 Saturation: 60.9 %
Total hemoglobin: 13 g/dL (ref 12.0–16.0)

## 2019-04-30 MED ORDER — SODIUM CHLORIDE 0.9% FLUSH
10.0000 mL | Freq: Two times a day (BID) | INTRAVENOUS | Status: DC
  Administered 2019-04-30 – 2019-05-01 (×3): 10 mL

## 2019-04-30 MED ORDER — SODIUM CHLORIDE 0.9% FLUSH: 10.0000 mL | INTRAVENOUS | Status: DC | PRN

## 2019-04-30 NOTE — Progress Notes (Signed)
Pt complains of 3 to 4 out of 10 chest pain, pressure like radiating to arms. RN gave patient sublingual nitro. Pt states pain subsiding. RN will continue to monitor.

## 2019-04-30 NOTE — Progress Notes (Signed)
CARDIAC REHAB PHASE I   PRE:  Rate/Rhythm: 110 ST  BP:  Supine: 98/75  Sitting: 96/69  Standing:    SaO2: 96% 2L  96%RA  MODE:  Ambulation:200  ft   POST:  Rate/Rhythm: 126 ST  BP:  Supine:   Sitting: 102/76  Standing:    SaO2: 96%RA 1246-1320 Pt walked 200 ft on RA with rolling walker and asst x 2. Pt tolerated well for first walk. Tired by end and slightly SOB. No CP. To recliner with call bell. Put oxygen back on at pt's request. Discussed CRP 2. Pt attended in 2005. Referred to [facility]. Pt is interested in Virtual Ex APP Pt is interested in participating in Virtual Cardiac and Pulmonary Rehab. Pt advised that Virtual Cardiac and Pulmonary Rehab is provided at no cost to the patient.  Checklist:  1. Pt has smart device  ie smartphone and/or ipad for downloading an app  Yes 2. Reliable internet/wifi service    Yes 3. Understands how to use their smartphone and navigate within an app.  Yes   Pt verbalized understanding and is in agreement.    Graylon Good, RN BSN  04/30/2019 1:14 PM

## 2019-04-30 NOTE — Progress Notes (Signed)
Peripherally Inserted Central Catheter/Midline Placement  The IV Nurse has discussed with the patient and/or persons authorized to consent for the patient, the purpose of this procedure and the potential benefits and risks involved with this procedure.  The benefits include less needle sticks, lab draws from the catheter, and the patient may be discharged home with the catheter. Risks include, but not limited to, infection, bleeding, blood clot (thrombus formation), and puncture of an artery; nerve damage and irregular heartbeat and possibility to perform a PICC exchange if needed/ordered by physician.  Alternatives to this procedure were also discussed.  Bard Power PICC patient education guide, fact sheet on infection prevention and patient information card has been provided to patient /or left at bedside.    PICC/Midline Placement Documentation  PICC Triple Lumen 77/11/65 PICC Right Basilic 40 cm 0 cm (Active)  Indication for Insertion or Continuance of Line Vasoactive infusions 04/30/19 0851  Exposed Catheter (cm) 0 cm 04/30/19 0851  Site Assessment Clean;Dry;Intact 04/30/19 0851  Lumen #1 Status Flushed;Blood return noted;Saline locked 04/30/19 0851  Lumen #2 Status Flushed;Blood return noted;Saline locked 04/30/19 0851  Lumen #3 Status Flushed;Blood return noted;Saline locked 04/30/19 0851  Dressing Type Transparent 04/30/19 0851  Dressing Status Clean;Dry;Intact;Antimicrobial disc in place 04/30/19 0851  Dressing Change Due 05/07/19 04/30/19 0851       Scotty Court 04/30/2019, 8:53 AM

## 2019-04-30 NOTE — Progress Notes (Addendum)
Advanced Heart Failure Rounding Note  PCP-Cardiologist: [name], MD  AHF: Dr. [name]   Subjective:   63 y/o smoker (originally from [place], [place]) with h/o CAD s/p previous MI, HTN, and HLD admitted for acute anterior STEMI. Cath w/ chronically occluded RCA (with collaterals from RCA) and thrombotic occlusion of proximal LAD. Underwent PCI of LAD.   ECHO EF 30-35% with apical aneursym. Mod AS. Minimal MR. No evidence VSD.   Developed recurrent CP post PCI and cardiogenic shock. Taken back to cath lab by Dr. [name] for [place] and possible mechanical support.  Outputs were marginal but not profound shock. Started on milrinone and swan placed to monitor hemodynamics.   Yesterday milrinone was cut back to 0.1 mcg. Midodrine added for low BP.   Denies SOB. Denies chest pain.   Objective:   Weight Range: 89.4 kg Body mass index is 26.73 kg/m.   Vital Signs:   Temp:  [97.6 F (36.4 C)-99.9 F (37.7 C)] 97.6 F (36.4 C) (12/18 0738) Pulse Rate:  [86-112] 100 (12/18 0738) Resp:  [18-32] 19 (12/18 0738) BP: (75-113)/(52-75) 107/64 (12/18 0738) SpO2:  [95 %-100 %] 95 % (12/18 0738) Last BM Date: 04/26/19  Weight change: Filed Weights   04/28/19 0354 04/28/19 1307 04/29/19 0400  Weight: 89.5 kg 89.5 kg 89.4 kg    Intake/Output:   Intake/Output Summary (Last 24 hours) at 04/30/2019 0945 Last data filed at 04/30/2019 0741 Gross per 24 hour  Intake 913.65 ml  Output 1050 ml  Net -136.35 ml      Physical Exam    General:  Well appearing. No resp difficulty HEENT: normal Neck: supple. JVP 5-6  Carotids 2+ bilat; no bruits. No lymphadenopathy or thryomegaly appreciated. Cor: PMI nondisplaced. Regular rate & rhythm. No rubs, gallops or murmurs. Lungs: clear Abdomen: soft, nontender, nondistended. No hepatosplenomegaly. No bruits or masses. Good bowel sounds. Extremities: no cyanosis, clubbing, rash, edema. RUE PICC  Neuro: alert & orientedx3, cranial nerves grossly  intact. moves all 4 extremities w/o difficulty. Affect pleasant   Telemetry  SR 90-100s personally reviewed.   EKG    NSR w/ inferior and anterior STE  Labs    CBC Recent Labs    04/27/19 2036 04/27/19 2231 04/29/19 0447 04/30/19 0655  WBC 16.7*   < > 12.8* 9.6  NEUTROABS 14.6*  --   --   --   HGB 16.0  --  13.6 13.2  HCT 45.5  --  39.6 37.6*  MCV 89.6   < > 92.3 91.5  PLT 251   < > 164 155   < > = values in this interval not displayed.   Basic Metabolic Panel Recent Labs    04/28/19 0100 04/29/19 0412 04/30/19 0655  NA 132* 131* 131*  K 4.2 4.1 3.9  CL 100 100 101  CO2 20* 20* 21*  GLUCOSE 142* 101* 100*  BUN 16 20 16   CREATININE 0.89 0.83 0.78  CALCIUM 9.2 7.9* 8.2*  MG 1.9  --   --    Liver Function Tests Recent Labs    04/27/19 1208 04/27/19 2036  AST 25 480*  ALT 25 103*  ALKPHOS 88 91  BILITOT 0.6 1.2  PROT 6.9 7.0  ALBUMIN 3.7 3.6   No results for input(s): LIPASE, AMYLASE in the last 72 hours. Cardiac Enzymes No results for input(s): CKTOTAL, CKMB, CKMBINDEX, TROPONINI in the last 72 hours.  BNP: BNP (last 3 results) No results for input(s): BNP in the last  8760 hours.  ProBNP (last 3 results) No results for input(s): PROBNP in the last 8760 hours.   D-Dimer No results for input(s): DDIMER in the last 72 hours. Hemoglobin A1C Recent Labs    04/27/19 2036  HGBA1C 6.0*   Fasting Lipid Panel Recent Labs    04/28/19 0100  CHOL 171  HDL 63  LDLCALC 93  TRIG 73  CHOLHDL 2.7   Thyroid Function Tests Recent Labs    04/27/19 2036  TSH 0.889    Other results:   Imaging    [place] EKG SITE RITE  Result Date: 04/29/2019 If Site Rite image not attached, placement could not be confirmed due to current cardiac rhythm.    Medications:     Scheduled Medications: . aspirin  81 mg Oral Daily  . atorvastatin  80 mg Oral q1800  . Chlorhexidine Gluconate Cloth  6 each Topical Daily  . digoxin  0.125 mg Oral Daily  .  enoxaparin (LOVENOX) injection  40 mg Subcutaneous Q24H  . furosemide  20 mg Oral Daily  . midodrine  2.5 mg Oral TID WC  . nicotine  21 mg Transdermal Daily  . sodium chloride flush  10-40 mL Intracatheter Q12H  . sodium chloride flush  3 mL Intravenous Q12H  . spironolactone  12.5 mg Oral Daily  . tamsulosin  0.4 mg Oral Daily  . ticagrelor  90 mg Oral BID    Infusions: . sodium chloride    . milrinone 0.1 mcg/kg/min (04/30/19 0700)    PRN Medications: sodium chloride, acetaminophen, ALPRAZolam, alum & mag hydroxide-simeth, fentaNYL (SUBLIMAZE) injection, nitroGLYCERIN, ondansetron (ZOFRAN) IV, sodium chloride flush, sodium chloride flush, zolpidem    Patient Profile   63 y/o smoker (originally from [place], [place]) with h/o CAD s/p previous MI, HTN, and HLD admitted for acute anterior STEMI. Cath w/ chronically occluded RCA (with collaterals from RCA) and thrombotic occlusion of proximal LAD. Underwent PCI of LAD. Developed cardiogenic shock requiring milrinone.  ECHO EF 30-35% with apical aneursym. Mod AS. Minimal MR. No evidence VSD.   Assessment/Plan    1. Acute systolic HF/cardiogenic shock - EF 30-35% with apical aneurysm. RV ok. - RHC number as above. Outputs are marginal but not profound shock - PICC placed this morning. CVP and CO-OX pending. If CO-OX > 55% stop milrinone.  - Renal function stable.  - Continue digoxin 0.125 - Continue Spiro 12.5 mg daily - BP remains soft. Continue midodrine 2.5 mg three times a day.  - No b-blocker yet w/ shock   2. CAD with acute anterior MI - s/p PCI LAD - hac CTO RCA with LCX -> RCA collaterals - continue DAPT/statin  - no b-blocker with shock  3. Tobacco use - cessation advised   4. Leukocytosis:  Resolved. Likely 2/2 MI. +/- ATX.  Check CO-OX/CVP   Consult cardiac rehab.   Length of Stay: 3  Amy Clegg, NP  04/30/2019, 9:45 AM  Advanced Heart Failure Team Pager [phone] (M-F; [place])  Please contact [facility] for night-coverage after hours (4p -7a ) and weekends on [email or web address]  Patient seen and examined with the above-signed Advanced Practice Provider and/or Housestaff. I personally reviewed laboratory data, imaging studies and relevant notes. I independently examined the patient and formulated the important aspects of the plan. I have edited the note to reflect any of my changes or salient points. I have personally discussed the plan with the patient and/or family.  Remains tenuous. On milrinone 0.125. Co-ox 61%. BP  soft. Midodrine added.   Will stop milrinone. Hold diuretics.   Unable to titrate HF meds due to low BP.   CR to see. Continue DAPT/statin.  Glori Bickers, MD  5:38 PM

## 2019-05-01 LAB — BASIC METABOLIC PANEL
Anion gap: 7 (ref 5–15)
BUN: 17 mg/dL (ref 8–23)
CO2: 25 mmol/L (ref 22–32)
Calcium: 8.2 mg/dL — ABNORMAL LOW (ref 8.9–10.3)
Chloride: 99 mmol/L (ref 98–111)
Creatinine, Ser: 0.72 mg/dL (ref 0.61–1.24)
GFR calc Af Amer: >60 mL/min (ref >60)
GFR calc non Af Amer: >60 mL/min (ref >60)
Glucose, Bld: 100 mg/dL — ABNORMAL HIGH (ref 70–99)
Potassium: 3.4 mmol/L — ABNORMAL LOW (ref 3.5–5.1)
Sodium: 131 mmol/L — ABNORMAL LOW (ref 135–145)

## 2019-05-01 LAB — CBC
HCT: 36.2 % — ABNORMAL LOW (ref 39.0–52.0)
Hemoglobin: 12.5 g/dL — ABNORMAL LOW (ref 13.0–17.0)
MCH: 31.8 pg (ref 26.0–34.0)
MCHC: 34.5 g/dL (ref 30.0–36.0)
MCV: 92.1 fL (ref 80.0–100.0)
Platelets: 150 10*3/uL (ref 150–400)
RBC: 3.93 MIL/uL — ABNORMAL LOW (ref 4.22–5.81)
RDW: 13 % (ref 11.5–15.5)
WBC: 7.9 10*3/uL (ref 4.0–10.5)
nRBC: 0 % (ref 0.0–0.2)

## 2019-05-01 LAB — COOXEMETRY PANEL
Carboxyhemoglobin: 1.3 % (ref 0.5–1.5)
Methemoglobin: 0.8 % (ref 0.0–1.5)
O2 Saturation: 68.7 %
Total hemoglobin: 12.7 g/dL (ref 12.0–16.0)

## 2019-05-01 MED ORDER — MIDODRINE HCL 5 MG PO TABS
5.0000 mg | ORAL_TABLET | Freq: Three times a day (TID) | ORAL | Status: DC
  Administered 2019-05-01 – 2019-05-04 (×9): 5 mg via ORAL
  Filled 2019-05-01 (×9): qty 1

## 2019-05-01 MED ORDER — POTASSIUM CHLORIDE CRYS ER 20 MEQ PO TBCR
40.0000 meq | EXTENDED_RELEASE_TABLET | Freq: Once | ORAL | Status: AC
  Administered 2019-05-01: 40 meq via ORAL
  Filled 2019-05-01: qty 2

## 2019-05-01 MED ORDER — EPLERENONE 25 MG PO TABS
12.5000 mg | ORAL_TABLET | Freq: Every day | ORAL | Status: DC
  Administered 2019-05-02 – 2019-05-04 (×3): 12.5 mg via ORAL
  Filled 2019-05-01 (×3): qty 1

## 2019-05-01 MED ORDER — CITALOPRAM HYDROBROMIDE 20 MG PO TABS
10.0000 mg | ORAL_TABLET | Freq: Every day | ORAL | Status: DC
  Administered 2019-05-01 – 2019-05-04 (×4): 10 mg via ORAL
  Filled 2019-05-01 (×4): qty 1

## 2019-05-01 NOTE — Progress Notes (Signed)
CARDIAC REHAB PHASE I   PRE:  Rate/Rhythm: 99 SR  BP:  Supine:   Sitting: 86/65  Standing:    SaO2: 98% 2L  MODE:  Ambulation: 340 ft   POST:  Rate/Rhythm: 108 ST  BP:  Supine:   Sitting: 99/66  Standing:    SaO2: 98%RA 1013-1108 Pt ready to walk. Pt walked 340 ft on RA with rolling walker and minimal asst. No CP. Stated a little lightheaded but no dizziness. To chair after walk. Left off oxygen but within reach if needed for napping. Began MI education. Discussed importance of brilinta with stent. Pt to discuss his motrin use with cardiologist. Reviewed NTG use, MI restrictions, smoking cessation, watching sodium with low EF and gave low sodium and heart healthy diets. Pt plans to not resume smoking. Smoking cessation handout given.  Pt normally tries to eat heart healthy.  Have referred to [place] CRP 2. Will continue ed on next visit. Pt voiced understanding.   Graylon Good, RN BSN  05/01/2019 11:01 AM

## 2019-05-01 NOTE — Plan of Care (Signed)
  Problem: Health Behavior/Discharge Planning: Goal: Ability to manage health-related needs will improve Outcome: Progressing   Problem: Education: Goal: Knowledge of General Education information will improve Description: Including pain rating scale, medication(s)/side effects and non-pharmacologic comfort measures Outcome: Progressing   Problem: Health Behavior/Discharge Planning: Goal: Ability to manage health-related needs will improve Outcome: Progressing   Problem: Clinical Measurements: Goal: Ability to maintain clinical measurements within normal limits will improve Outcome: Progressing Goal: Will remain free from infection Outcome: Progressing Goal: Diagnostic test results will improve Outcome: Progressing Goal: Respiratory complications will improve Outcome: Progressing Goal: Cardiovascular complication will be avoided Outcome: Progressing   Problem: Activity: Goal: Risk for activity intolerance will decrease Outcome: Progressing   Problem: Nutrition: Goal: Adequate nutrition will be maintained Outcome: Progressing   Problem: Coping: Goal: Level of anxiety will decrease Outcome: Progressing   Problem: Elimination: Goal: Will not experience complications related to bowel motility Outcome: Progressing Goal: Will not experience complications related to urinary retention Outcome: Progressing   Problem: Pain Managment: Goal: General experience of comfort will improve Outcome: Progressing   Problem: Safety: Goal: Ability to remain free from injury will improve Outcome: Progressing   Problem: Skin Integrity: Goal: Risk for impaired skin integrity will decrease Outcome: Progressing   Problem: Education: Goal: Understanding of CV disease, CV risk reduction, and recovery process will improve Outcome: Progressing Goal: Individualized Educational Video(s) Outcome: Progressing   Problem: Activity: Goal: Ability to return to baseline activity level will  improve Outcome: Progressing   Problem: Cardiovascular: Goal: Ability to achieve and maintain adequate cardiovascular perfusion will improve Outcome: Progressing Goal: Vascular access site(s) Level 0-1 will be maintained Outcome: Progressing   Problem: Health Behavior/Discharge Planning: Goal: Ability to safely manage health-related needs after discharge will improve Outcome: Progressing

## 2019-05-01 NOTE — Progress Notes (Addendum)
Advanced Heart Failure Rounding Note  PCP-Cardiologist: [name], MD  AHF: Dr. [name]   Subjective:    63 y/o smoker (originally from [place], [place]) with h/o CAD s/p previous MI, HTN, and HLD admitted for acute anterior STEMI. Cath w/ chronically occluded RCA (with collaterals from RCA) and thrombotic occlusion of proximal LAD. Underwent PCI of LAD.   - ECHO EF 30-35% with apical aneursym. Mod AS. Minimal MR. No evidence VSD.   Milrinone stopped on 12/19. Midodrine added for low BP.   Feels ok. Walking with CR. C/o chest twinges.  No SOB, orthopnea or PND. Feels anxious and depressed.   Co-ox 69%. CVP 4. Weight stable 197   Objective:   Weight Range: 89.7 kg Body mass index is 26.82 kg/m.   Vital Signs:   Temp:  [97.7 F (36.5 C)-98.2 F (36.8 C)] 97.7 F (36.5 C) (12/19 1130) Pulse Rate:  [85-108] 85 (12/19 1130) Resp:  [15-22] 20 (12/19 0811) BP: (82-92)/(60-70) 88/65 (12/19 1130) SpO2:  [93 %-99 %] 96 % (12/19 1130) Weight:  [89.7 kg] 89.7 kg (12/19 0421) Last BM Date: 05/01/19  Weight change: Filed Weights   04/28/19 1307 04/29/19 0400 05/01/19 0421  Weight: 89.5 kg 89.4 kg 89.7 kg    Intake/Output:   Intake/Output Summary (Last 24 hours) at 05/01/2019 1205 Last data filed at 05/01/2019 1114 Gross per 24 hour  Intake 754.61 ml  Output 750 ml  Net 4.61 ml      Physical Exam    General:  Walking with walker. No resp difficulty HEENT: normal Neck: supple. no JVD. Carotids 2+ bilat; no bruits. No lymphadenopathy or thryomegaly appreciated. Cor: PMI nondisplaced. Regular rate & rhythm. No rubs, gallops or murmurs. Lungs: clear Abdomen: soft, nontender, nondistended. No hepatosplenomegaly. No bruits or masses. Good bowel sounds. Extremities: no cyanosis, clubbing, rash, edema Neuro: alert & orientedx3, cranial nerves grossly intact. moves all 4 extremities w/o difficulty. Affect pleasant   Telemetry   SR 80-90s personally reviewed.    Labs    CBC Recent Labs    04/30/19 0655 05/01/19 0430  WBC 9.6 7.9  HGB 13.2 12.5*  HCT 37.6* 36.2*  MCV 91.5 92.1  PLT 155 916   Basic Metabolic Panel Recent Labs    04/30/19 0655 05/01/19 0430  NA 131* 131*  K 3.9 3.4*  CL 101 99  CO2 21* 25  GLUCOSE 100* 100*  BUN 16 17  CREATININE 0.78 0.72  CALCIUM 8.2* 8.2*   Liver Function Tests No results for input(s): AST, ALT, ALKPHOS, BILITOT, PROT, ALBUMIN in the last 72 hours. No results for input(s): LIPASE, AMYLASE in the last 72 hours. Cardiac Enzymes No results for input(s): CKTOTAL, CKMB, CKMBINDEX, TROPONINI in the last 72 hours.  BNP: BNP (last 3 results) No results for input(s): BNP in the last 8760 hours.  ProBNP (last 3 results) No results for input(s): PROBNP in the last 8760 hours.   D-Dimer No results for input(s): DDIMER in the last 72 hours. Hemoglobin A1C No results for input(s): HGBA1C in the last 72 hours. Fasting Lipid Panel No results for input(s): CHOL, HDL, LDLCALC, TRIG, CHOLHDL, LDLDIRECT in the last 72 hours. Thyroid Function Tests No results for input(s): TSH, T4TOTAL, T3FREE, THYROIDAB in the last 72 hours.  Invalid input(s): FREET3  Other results:   Imaging    No results found.   Medications:     Scheduled Medications: . aspirin  81 mg Oral Daily  . atorvastatin  80 mg Oral  q1800  . Chlorhexidine Gluconate Cloth  6 each Topical Daily  . digoxin  0.125 mg Oral Daily  . enoxaparin (LOVENOX) injection  40 mg Subcutaneous Q24H  . furosemide  20 mg Oral Daily  . midodrine  2.5 mg Oral TID WC  . nicotine  21 mg Transdermal Daily  . sodium chloride flush  10-40 mL Intracatheter Q12H  . sodium chloride flush  3 mL Intravenous Q12H  . spironolactone  12.5 mg Oral Daily  . tamsulosin  0.4 mg Oral Daily  . ticagrelor  90 mg Oral BID    Infusions: . sodium chloride      PRN Medications: sodium chloride, acetaminophen, ALPRAZolam, alum & mag hydroxide-simeth,  fentaNYL (SUBLIMAZE) injection, nitroGLYCERIN, ondansetron (ZOFRAN) IV, sodium chloride flush, sodium chloride flush, zolpidem    Patient Profile   63 y/o smoker (originally from [place], [place]) with h/o CAD s/p previous MI, HTN, and HLD admitted for acute anterior STEMI. Cath w/ chronically occluded RCA (with collaterals from RCA) and thrombotic occlusion of proximal LAD. Underwent PCI of LAD. Developed cardiogenic shock requiring milrinone.  ECHO EF 30-35% with apical aneursym. Mod AS. Minimal MR. No evidence VSD.   Assessment/Plan    1. Acute systolic HF/cardiogenic shock - EF 30-35% with apical aneurysm. RV ok. - Milrinone stopped 12/18. Co-ox 69% - Renal function stable.  - Continue digoxin 0.125 - Continue Spiro 12.5 mg daily - CVP low. Stop lasix for now - BP remains soft. Increase midodrine to 5 mg three times a day.  - No b-blocker or ARB yet with SBPs in 80s - Need to follow closely for need for mechanical support - Rhythm stable  2. CAD with acute anterior MI - s/p PCI LAD - hac CTO RCA with LCX -> RCA collaterals - no evidence ongoing angina - continue DAPT/statin  - no b-blocker with low BP - CR consulted  3. Tobacco use - cessation advised   4. Hypokalemia - will supp  5. Depression - Start Celexa  6. Ankylosing spondylitis with uveitis - gets remicade q 6 weeks  7. Retinal disease - on eplerenone at home. Will switch spiro to eplernone  Length of Stay: 4  Glori Bickers, MD  05/01/2019, 12:05 PM  Advanced Heart Failure Team Pager [phone] (M-F; 7a - 4p)  Please contact [facility] for night-coverage after hours (4p -7a ) and weekends on [email or web address]

## 2019-05-02 ENCOUNTER — Inpatient Hospital Stay (HOSPITAL_COMMUNITY): Payer: Medicare Other

## 2019-05-02 LAB — BASIC METABOLIC PANEL
Anion gap: 6 (ref 5–15)
BUN: 18 mg/dL (ref 8–23)
CO2: 24 mmol/L (ref 22–32)
Calcium: 8.3 mg/dL — ABNORMAL LOW (ref 8.9–10.3)
Chloride: 98 mmol/L (ref 98–111)
Creatinine, Ser: 0.69 mg/dL (ref 0.61–1.24)
GFR calc Af Amer: >60 mL/min (ref >60)
GFR calc non Af Amer: >60 mL/min (ref >60)
Glucose, Bld: 133 mg/dL — ABNORMAL HIGH (ref 70–99)
Potassium: 3.5 mmol/L (ref 3.5–5.1)
Sodium: 128 mmol/L — ABNORMAL LOW (ref 135–145)

## 2019-05-02 LAB — COOXEMETRY PANEL
Carboxyhemoglobin: 1.1 % (ref 0.5–1.5)
Methemoglobin: 0.7 % (ref 0.0–1.5)
O2 Saturation: 74.2 %
Total hemoglobin: 11.9 g/dL — ABNORMAL LOW (ref 12.0–16.0)

## 2019-05-02 MED ORDER — NITROGLYCERIN IN D5W 200-5 MCG/ML-% IV SOLN: 0.0000 ug/min | INTRAVENOUS | Status: DC

## 2019-05-02 MED ORDER — POTASSIUM CHLORIDE CRYS ER 20 MEQ PO TBCR
40.0000 meq | EXTENDED_RELEASE_TABLET | Freq: Once | ORAL | Status: AC
  Administered 2019-05-02: 40 meq via ORAL
  Filled 2019-05-02: qty 2

## 2019-05-02 MED ORDER — NITROGLYCERIN IN D5W 200-5 MCG/ML-% IV SOLN
INTRAVENOUS | Status: AC
  Administered 2019-05-02: 5 ug/min
  Filled 2019-05-02: qty 250

## 2019-05-02 NOTE — Plan of Care (Signed)
  Problem: Health Behavior/Discharge Planning: Goal: Ability to manage health-related needs will improve Outcome: Progressing   Problem: Education: Goal: Knowledge of General Education information will improve Description: Including pain rating scale, medication(s)/side effects and non-pharmacologic comfort measures Outcome: Progressing   Problem: Health Behavior/Discharge Planning: Goal: Ability to manage health-related needs will improve Outcome: Progressing   Problem: Clinical Measurements: Goal: Ability to maintain clinical measurements within normal limits will improve Outcome: Progressing Goal: Will remain free from infection Outcome: Progressing Goal: Diagnostic test results will improve Outcome: Progressing Goal: Respiratory complications will improve Outcome: Progressing Goal: Cardiovascular complication will be avoided Outcome: Progressing   Problem: Activity: Goal: Risk for activity intolerance will decrease Outcome: Progressing   Problem: Nutrition: Goal: Adequate nutrition will be maintained Outcome: Progressing   Problem: Coping: Goal: Level of anxiety will decrease Outcome: Progressing   Problem: Elimination: Goal: Will not experience complications related to bowel motility Outcome: Progressing Goal: Will not experience complications related to urinary retention Outcome: Progressing   Problem: Pain Managment: Goal: General experience of comfort will improve Outcome: Progressing   Problem: Safety: Goal: Ability to remain free from injury will improve Outcome: Progressing   Problem: Skin Integrity: Goal: Risk for impaired skin integrity will decrease Outcome: Progressing   Problem: Education: Goal: Understanding of CV disease, CV risk reduction, and recovery process will improve Outcome: Progressing Goal: Individualized Educational Video(s) Outcome: Progressing   Problem: Activity: Goal: Ability to return to baseline activity level will  improve Outcome: Progressing   Problem: Cardiovascular: Goal: Ability to achieve and maintain adequate cardiovascular perfusion will improve Outcome: Progressing Goal: Vascular access site(s) Level 0-1 will be maintained Outcome: Progressing   Problem: Health Behavior/Discharge Planning: Goal: Ability to safely manage health-related needs after discharge will improve Outcome: Progressing

## 2019-05-02 NOTE — Significant Event (Signed)
Paged by nurse that the patient developed chest pain, nausea. ECG completed which showed residual anterior STE, stable to improved from most recent ECG following LAD PCI. At bedside, patient reports pain is much improved but remains approximately 1-2 in intensity. Borderline BP in the 574V systolic, stable from prior. Patient with residual, unrevascularized CAD that may be etiology of ongoing pain episodes. Plan to start low dose nitro drip as patient previously with hypotension following SLNG. If pain escalates or ECG shows worsening STE, would plan for relook cath.   Bryna Colander, MD

## 2019-05-02 NOTE — Progress Notes (Signed)
Advanced Heart Failure Rounding Note  PCP-Cardiologist: [name], MD  AHF: Dr. [name]   Subjective:    63 y/o smoker (originally from [place], [place]) with h/o CAD s/p previous MI, HTN, and HLD admitted for acute anterior STEMI. Cath w/ chronically occluded RCA (with collaterals from RCA) and thrombotic occlusion of proximal LAD. Underwent PCI of LAD.   - ECHO EF 30-35% with apical aneursym. Mod AS. Minimal MR. No evidence VSD.   Milrinone stopped on 12/19. Midodrine added for low BP.   Developed CP last night and started on IV NTG.  Today he says that it was more like indigestion. Says he feels bipolar. Feels depressed and anxious. Walking room today without problem.  Co-ox 74% SBP in 90s  Objective:   Weight Range: 91 kg Body mass index is 27.22 kg/m.   Vital Signs:   Temp:  [97.6 F (36.4 C)-98.7 F (37.1 C)] 97.6 F (36.4 C) (12/20 0738) Pulse Rate:  [84-101] 91 (12/20 0900) Resp:  [14-25] 22 (12/20 0738) BP: (84-109)/(60-77) 95/60 (12/20 0738) SpO2:  [95 %-99 %] 98 % (12/20 0738) Weight:  [90.7 kg-91 kg] 91 kg (12/20 0810) Last BM Date: 05/01/19  Weight change: Filed Weights   05/01/19 0421 05/02/19 0256 05/02/19 0810  Weight: 89.7 kg 90.7 kg 91 kg    Intake/Output:   Intake/Output Summary (Last 24 hours) at 05/02/2019 0954 Last data filed at 05/02/2019 0742 Gross per 24 hour  Intake 607.5 ml  Output 450 ml  Net 157.5 ml      Physical Exam    General:  Well appearing. No resp difficulty HEENT: normal Neck: supple. no JVD. Carotids 2+ bilat; no bruits. No lymphadenopathy or thryomegaly appreciated. Cor: PMI nondisplaced. Regular rate & rhythm. No rubs, gallops or murmurs. Lungs: clear Abdomen: soft, nontender, nondistended. No hepatosplenomegaly. No bruits or masses. Good bowel sounds. Extremities: no cyanosis, clubbing, rash, edema Neuro: alert & orientedx3, cranial nerves grossly intact. moves all 4 extremities w/o difficulty. Affect  stressed and tearful at times  Telemetry   SR 80-90s personally reviewed.   Labs    CBC Recent Labs    04/30/19 0655 05/01/19 0430  WBC 9.6 7.9  HGB 13.2 12.5*  HCT 37.6* 36.2*  MCV 91.5 92.1  PLT 155 323   Basic Metabolic Panel Recent Labs    05/01/19 0430 05/02/19 0251  NA 131* 128*  K 3.4* 3.5  CL 99 98  CO2 25 24  GLUCOSE 100* 133*  BUN 17 18  CREATININE 0.72 0.69  CALCIUM 8.2* 8.3*   Liver Function Tests No results for input(s): AST, ALT, ALKPHOS, BILITOT, PROT, ALBUMIN in the last 72 hours. No results for input(s): LIPASE, AMYLASE in the last 72 hours. Cardiac Enzymes No results for input(s): CKTOTAL, CKMB, CKMBINDEX, TROPONINI in the last 72 hours.  BNP: BNP (last 3 results) No results for input(s): BNP in the last 8760 hours.  ProBNP (last 3 results) No results for input(s): PROBNP in the last 8760 hours.   D-Dimer No results for input(s): DDIMER in the last 72 hours. Hemoglobin A1C No results for input(s): HGBA1C in the last 72 hours. Fasting Lipid Panel No results for input(s): CHOL, HDL, LDLCALC, TRIG, CHOLHDL, LDLDIRECT in the last 72 hours. Thyroid Function Tests No results for input(s): TSH, T4TOTAL, T3FREE, THYROIDAB in the last 72 hours.  Invalid input(s): FREET3  Other results:   Imaging    No results found.   Medications:     Scheduled Medications: .  aspirin  81 mg Oral Daily  . atorvastatin  80 mg Oral q1800  . Chlorhexidine Gluconate Cloth  6 each Topical Daily  . citalopram  10 mg Oral Daily  . digoxin  0.125 mg Oral Daily  . enoxaparin (LOVENOX) injection  40 mg Subcutaneous Q24H  . eplerenone  12.5 mg Oral Daily  . midodrine  5 mg Oral TID WC  . nicotine  21 mg Transdermal Daily  . tamsulosin  0.4 mg Oral Daily  . ticagrelor  90 mg Oral BID    Infusions: . nitroGLYCERIN 5 mcg/min (05/02/19 0609)    PRN Medications: acetaminophen, ALPRAZolam, alum & mag hydroxide-simeth, fentaNYL (SUBLIMAZE) injection,  nitroGLYCERIN, ondansetron (ZOFRAN) IV, sodium chloride flush, zolpidem    Patient Profile   63 y/o smoker (originally from [place], [place]) with h/o CAD s/p previous MI, HTN, and HLD admitted for acute anterior STEMI. Cath w/ chronically occluded RCA (with collaterals from RCA) and thrombotic occlusion of proximal LAD. Underwent PCI of LAD. Developed cardiogenic shock requiring milrinone.  ECHO EF 30-35% with apical aneursym. Mod AS. Minimal MR. No evidence VSD.   Assessment/Plan    1. Acute systolic HF/cardiogenic shock - EF 30-35% with apical aneurysm. RV ok. - Milrinone stopped 12/18. Co-ox 74% - Renal function stable.  - Continue digoxin 0.125 - Continue eplerenone 12.5 mg daily - Off lasix with low CVP and hypotension - BP soft but improved on midodrine 5mg  tid.  - No b-blocker or ARB yet with SBPs in 80s - Rhythm stable  2. CAD with acute anterior MI - s/p PCI LAD - has CTO RCA with LCX -> RCA collaterals - doubt CP overnight was ischemic. (no troponins checked). Can stop NTG - continue DAPT/statin  - no b-blocker yet with low BP. Hopefully we can add soon  - CR consulted  3. Tobacco use - cessation advised   4. Hypokalemia - k 3.5  will supp  5. Depression - Celexa 10 started yesterday. Will increase to 20.  - I d/w our HF SW. She will see in am   6. Ankylosing spondylitis with uveitis - gets remicade q 6 weeks. Next dose due 05/31/19  7. Retinal disease (central serous chorioretinopathy) - on eplerenone at home (spiro switched to eplerenone here)  Length of Stay: [place], MD  05/02/2019, 9:54 AM  Advanced Heart Failure Team Pager [phone] (M-F; 7a - 4p)  Please contact [facility] for night-coverage after hours (4p -7a ) and weekends on [email or web address]

## 2019-05-02 NOTE — Significant Event (Signed)
Rapid Response Event Note  Overview:Called d/t SOB/chest pressure/nausea. Time Called: 2339 Arrival Time: 2343 Event Type: Respiratory, Cardiac  Initial Focused Assessment: Pt sitting on side of bed in tripod position, +WOB, +accessory muscle use. Pt alert and oriented, irritable, c/o 7/10 chest tightness, nausea, SOB. Lungs diminished with scattered crackles t/o. +Murmur. Skin warm and dry. T-97.8, HR-136, BP-124/78, RR-30s, SpO2-94% on 6L [place].   Lasix stopped on 12/19 d/t hypotension and low CVP-4. CVP this afternoon-13 and now 10 per bedside RN.  Interventions: PCXR-1. Prominent interstitial lung markings which are nonspecific and may be secondary to developing interstitial edema. 2. Borderline cardiomegaly. MD to come assess pt.  Plan of Care (if not transferred): Await orders from Cards MD. Continue to monitor pt. Call RRT if further assistance needed.  Event Summary:   at      at          [facility], Carren Rang

## 2019-05-02 NOTE — Progress Notes (Signed)
Called to Pt's room. Pt c/o chest tightness and nausea.  Bp 104/79/  Increased o2 to 4l [place], EKG obtained, Rapid Response called to bedside.  Zofran given.  Pt stated " felt like did when had heart attack"  "Almost feels like indigestion"  Gave coke to drink  Md notified.  Did not give NTG SL due to low bp  History.  Tightness eased off.  Will continue to monitor [facility] [place] T

## 2019-05-02 NOTE — Significant Event (Addendum)
Rapid Response Event Note  Overview:Pt admitted 12/15 with ANT STEMI-went to cardaic cath and had PCI of LAD. RRT called because pt began to c/o nausea/chest pressure. EKG was done and showed "ACUTE MI STEMI-ST, ANT infarct, possibly acute, T wave abnormality consider lateral ischemia" Time Called: 0159 Arrival Time: 0205 Event Type: Cardiac  Initial Focused Assessment: Pt laying in bed, alert and oriented, skin warm and dry. Pt said he was having 2-3/10 chest pressure and nausea that has resolved after receiving zofran. Pt says he feels a lot better with no more pressure in chest. Lungs clear t/o. Heart murmur auscultated. Pulses strong. HR-103(ST), BP-106/78, RR-23, SpO2-96% on 2L [place].   EKG reviewed-looks similar to last one.  SL NTG not given d/t SBP and symptoms better on arrival.  Interventions: None-Dr.Lowenstern  to drop by and see pt-no new orders given at this time.  Plan of Care (if not transferred): Await MD arrival. Continue to monitor pt closely. Call RRT if further assistance needed.  Event Summary:   at  Dr. Georgette Shell @ [phone]    at    Update: 20mg  lasix given at 0046 and at 0115-pt breathing a lot better, chest pain/pressure gone per pt,  says he feels better, HR-107, RR-20, SpO2-98% on 5L .       [place]

## 2019-05-03 LAB — COOXEMETRY PANEL
Carboxyhemoglobin: 1.5 % (ref 0.5–1.5)
Methemoglobin: 1 % (ref 0.0–1.5)
O2 Saturation: 75.2 %
Total hemoglobin: 12.6 g/dL (ref 12.0–16.0)

## 2019-05-03 LAB — BASIC METABOLIC PANEL
Anion gap: 7 (ref 5–15)
BUN: 14 mg/dL (ref 8–23)
CO2: 26 mmol/L (ref 22–32)
Calcium: 8.8 mg/dL — ABNORMAL LOW (ref 8.9–10.3)
Chloride: 97 mmol/L — ABNORMAL LOW (ref 98–111)
Creatinine, Ser: 0.76 mg/dL (ref 0.61–1.24)
GFR calc Af Amer: >60 mL/min (ref >60)
GFR calc non Af Amer: >60 mL/min (ref >60)
Glucose, Bld: 127 mg/dL — ABNORMAL HIGH (ref 70–99)
Potassium: 3.9 mmol/L (ref 3.5–5.1)
Sodium: 130 mmol/L — ABNORMAL LOW (ref 135–145)

## 2019-05-03 MED ORDER — FUROSEMIDE 40 MG PO TABS
40.0000 mg | ORAL_TABLET | Freq: Every day | ORAL | Status: DC
  Administered 2019-05-03: 40 mg via ORAL
  Filled 2019-05-03: qty 1

## 2019-05-03 MED ORDER — FUROSEMIDE 10 MG/ML IJ SOLN
20.0000 mg | Freq: Once | INTRAMUSCULAR | Status: AC
  Administered 2019-05-03: 20 mg via INTRAVENOUS
  Filled 2019-05-03: qty 2

## 2019-05-03 MED ORDER — FUROSEMIDE 20 MG PO TABS
20.0000 mg | ORAL_TABLET | Freq: Every day | ORAL | Status: DC
  Administered 2019-05-04: 20 mg via ORAL
  Filled 2019-05-03: qty 1

## 2019-05-03 MED ORDER — ALBUTEROL SULFATE (2.5 MG/3ML) 0.083% IN NEBU: 2.5000 mg | INHALATION_SOLUTION | RESPIRATORY_TRACT | Status: DC | PRN

## 2019-05-03 MED ORDER — ALPRAZOLAM 0.5 MG PO TABS: 0.5000 mg | ORAL_TABLET | Freq: Two times a day (BID) | ORAL | Status: DC | PRN

## 2019-05-03 NOTE — Significant Event (Signed)
Called to assess pt at the bedside for worsening SOB. Went to pt's bedside around 12:30am. CXR done prior to my arrival showed prominent interstitial markings, possibly developing edema. Pt seemed anxious, w/ increased WOB on exam; lungs w/ few exp wheezes but o/w clear (but pt taking shallow breaths). Sats mid 90s on 6L [place]. HR ~100. Pt no longer on lasix due to low CVP. CVP this evening est to be ~10-13.  Plan for lasix 20mg  IV x 1 dose, Xanax 0.5mg  x 1 dose, albuterol nebs.   Rudean Curt, MD , [facility]

## 2019-05-03 NOTE — Progress Notes (Signed)
CARDIAC REHAB PHASE I   PRE:  Rate/Rhythm: 88 SR  BP:  Supine:   Sitting: 98/77  Standing:    SaO2: 97% 21/4 L  MODE:  Ambulation: 340 ft   POST:  Rate/Rhythm: 97 SR  BP:  Supine:   Sitting: 104/79  Standing:    SaO2: 99% 2L 1000-1035 Pt walked 340 ft on 2L with rolling walker and asst x 1. Tolerated well. Slightly SOB but pt stated breathing much better. To recliner after walk. Left on 2L oxygen. Discussed with patient about signs of CHF and importance of daily weights, low sodium diet, 2L FR and watching for signs of when to call MD. Will give CHF booklet and review yellow zone of when to call MD. Pt very receptive to ed.   Graylon Good, RN BSN  05/03/2019 10:32 AM

## 2019-05-03 NOTE — Progress Notes (Signed)
CSW referred to provided supportive counseling to patient at bedside. Patient shared life events and past medical history. He is retired from [employer] and never married. He has a sister who lives local and is only family.  He mentioned multiple neighbors who are also in his support system. He shared concerns that he missed symptoms of his MI and if the delay had an impact on his health. He states that he is typically a positive person but is struggling with some "ups and downs" this time. He describes his MI in 2005 as "being easy" as compared to this MI. He acknowledged he is scared and having thoughts of his own mortality. CSW provided supportive listening and discussed ongoing conversations around health goals for the future. Patient appears very interested in continued supportive interventions and some possible health coaching in the ambulatory setting post hospitalization. CSW will follow up with patient tomorrow and continue supportive counseling around coping with diagnosis and establishing health goals. [name], [place], [place]

## 2019-05-03 NOTE — Progress Notes (Signed)
Dr. Hassell Done notified of patients c/o shortness of breath. Patient on 6L/[place] of O2 sats 94% and patient is sitting on the side of the bed leaning forward stating " I can't get my breath". Respiratory notified and Rapid response. Lungs sounds are diminished in the bases with slight crackles. Mindy from rapid response and Sarah for RT came to assess patient and Dr. Hassell Done was notified. Dr. Hassell Done changed his Xanax to 0.5mg  PO Q6 hrs PRN, added Albuterol for chest tightness, and order Lasix 20mg  IV one time dose. Lasix was given to patient IV at [place]. Will continue to monitor patient closely.

## 2019-05-03 NOTE — Plan of Care (Signed)
  Problem: Health Behavior/Discharge Planning: Goal: Ability to manage health-related needs will improve Outcome: Progressing   Problem: Education: Goal: Understanding of CV disease, CV risk reduction, and recovery process will improve Outcome: Progressing Goal: Individualized Educational Video(s) Outcome: Progressing   Problem: Activity: Goal: Ability to return to baseline activity level will improve Outcome: Progressing   Problem: Cardiovascular: Goal: Ability to achieve and maintain adequate cardiovascular perfusion will improve Outcome: Progressing Goal: Vascular access site(s) Level 0-1 will be maintained Outcome: Progressing   Problem: Health Behavior/Discharge Planning: Goal: Ability to safely manage health-related needs after discharge will improve Outcome: Progressing   Problem: Education: Goal: Knowledge of General Education information will improve Description: Including pain rating scale, medication(s)/side effects and non-pharmacologic comfort measures Outcome: Progressing   Problem: Health Behavior/Discharge Planning: Goal: Ability to manage health-related needs will improve Outcome: Progressing   Problem: Clinical Measurements: Goal: Ability to maintain clinical measurements within normal limits will improve Outcome: Progressing Goal: Will remain free from infection Outcome: Progressing Goal: Diagnostic test results will improve Outcome: Progressing Goal: Respiratory complications will improve Outcome: Progressing Goal: Cardiovascular complication will be avoided Outcome: Progressing   Problem: Activity: Goal: Risk for activity intolerance will decrease Outcome: Progressing   Problem: Nutrition: Goal: Adequate nutrition will be maintained Outcome: Progressing   Problem: Coping: Goal: Level of anxiety will decrease Outcome: Progressing   Problem: Elimination: Goal: Will not experience complications related to bowel motility Outcome:  Progressing Goal: Will not experience complications related to urinary retention Outcome: Progressing   Problem: Pain Managment: Goal: General experience of comfort will improve Outcome: Progressing   Problem: Safety: Goal: Ability to remain free from injury will improve Outcome: Progressing   Problem: Skin Integrity: Goal: Risk for impaired skin integrity will decrease Outcome: Progressing

## 2019-05-03 NOTE — Progress Notes (Addendum)
Advanced Heart Failure Rounding Note  PCP-Cardiologist: [name], MD  AHF: Dr. [name]   Subjective:    63 y/o smoker (originally from [place], [place]) with h/o CAD s/p previous MI, HTN, and HLD admitted for acute anterior STEMI. Cath w/ chronically occluded RCA (with collaterals from RCA) and thrombotic occlusion of proximal LAD. Underwent PCI of LAD.   - ECHO EF 30-35% with apical aneursym. Mod AS. Minimal MR. No evidence VSD.   Milrinone stopped on 12/19. Midodrine added for low BP.   Last night given 20 mg IV lasix for increased dyspnea.  Negative 1.2 liters.   Denies SOB.     Objective:   Weight Range: 90.5 kg Body mass index is 27.06 kg/m.   Vital Signs:   Temp:  [97.5 F (36.4 C)-98.1 F (36.7 C)] 98.1 F (36.7 C) (12/21 0800) Pulse Rate:  [60-135] 98 (12/21 0800) Resp:  [17-38] 21 (12/21 0800) BP: (83-117)/(55-84) 103/72 (12/21 0800) SpO2:  [95 %-100 %] 100 % (12/21 0800) Weight:  [90.5 kg] 90.5 kg (12/21 0500) Last BM Date: 05/02/19  Weight change: Filed Weights   05/02/19 0256 05/02/19 0810 05/03/19 0500  Weight: 90.7 kg 91 kg 90.5 kg    Intake/Output:   Intake/Output Summary (Last 24 hours) at 05/03/2019 1030 Last data filed at 05/03/2019 0300 Gross per 24 hour  Intake 351.31 ml  Output 1700 ml  Net -1348.69 ml      Physical Exam   CVP 8 personally checked sitting in the chair.  General: Sitting in the chair. No resp difficulty HEENT: normal anicteric Neck: supple. JVP 7-8  Carotids 2+ bilat; no bruits. No lymphadenopathy or thryomegaly appreciated. Cor: PMI nondisplaced. Regular rate & rhythm. No rubs, gallops or murmurs. Lungs: clear no wheeze Abdomen: soft, nontender, nondistended. No hepatosplenomegaly. No bruits or masses. Good bowel sounds. Extremities: no cyanosis, clubbing, rash, edema. RUE PICC  Neuro: alert & orientedx3, cranial nerves grossly intact. moves all 4 extremities w/o difficulty. Affect pleasant but tearful at  times   Telemetry   SR 80-90s personally checked.   Labs    CBC Recent Labs    05/01/19 0430  WBC 7.9  HGB 12.5*  HCT 36.2*  MCV 92.1  PLT 591   Basic Metabolic Panel Recent Labs    05/02/19 0251 05/03/19 0856  NA 128* 130*  K 3.5 3.9  CL 98 97*  CO2 24 26  GLUCOSE 133* 127*  BUN 18 14  CREATININE 0.69 0.76  CALCIUM 8.3* 8.8*   Liver Function Tests No results for input(s): AST, ALT, ALKPHOS, BILITOT, PROT, ALBUMIN in the last 72 hours. No results for input(s): LIPASE, AMYLASE in the last 72 hours. Cardiac Enzymes No results for input(s): CKTOTAL, CKMB, CKMBINDEX, TROPONINI in the last 72 hours.  BNP: BNP (last 3 results) No results for input(s): BNP in the last 8760 hours.  ProBNP (last 3 results) No results for input(s): PROBNP in the last 8760 hours.   D-Dimer No results for input(s): DDIMER in the last 72 hours. Hemoglobin A1C No results for input(s): HGBA1C in the last 72 hours. Fasting Lipid Panel No results for input(s): CHOL, HDL, LDLCALC, TRIG, CHOLHDL, LDLDIRECT in the last 72 hours. Thyroid Function Tests No results for input(s): TSH, T4TOTAL, T3FREE, THYROIDAB in the last 72 hours.  Invalid input(s): FREET3  Other results:   Imaging    DG Chest Port 1 View  Result Date: 05/03/2019 CLINICAL DATA:  Chest pain EXAM: PORTABLE CHEST 1 VIEW COMPARISON:  01/22/2012 FINDINGS: Heart size is borderline enlarged. There is a well-positioned right-sided PICC line. There are prominent interstitial lung markings bilaterally. The lungs are hyperexpanded. There may be trace to small bilateral pleural effusions. There is no pneumothorax. IMPRESSION: 1. Prominent interstitial lung markings which are nonspecific and may be secondary to developing interstitial edema. 2. Borderline cardiomegaly. 3. Well-positioned right PICC line. Electronically Signed   By: Constance Holster M.D.   On: 05/03/2019 00:10     Medications:     Scheduled Medications: .  aspirin  81 mg Oral Daily  . atorvastatin  80 mg Oral q1800  . Chlorhexidine Gluconate Cloth  6 each Topical Daily  . citalopram  10 mg Oral Daily  . digoxin  0.125 mg Oral Daily  . enoxaparin (LOVENOX) injection  40 mg Subcutaneous Q24H  . eplerenone  12.5 mg Oral Daily  . midodrine  5 mg Oral TID WC  . nicotine  21 mg Transdermal Daily  . tamsulosin  0.4 mg Oral Daily  . ticagrelor  90 mg Oral BID    Infusions:   PRN Medications: acetaminophen, albuterol, ALPRAZolam, alum & mag hydroxide-simeth, fentaNYL (SUBLIMAZE) injection, ondansetron (ZOFRAN) IV, sodium chloride flush, zolpidem    Patient Profile   63 y/o smoker (originally from [place], [place]) with h/o CAD s/p previous MI, HTN, and HLD admitted for acute anterior STEMI. Cath w/ chronically occluded RCA (with collaterals from RCA) and thrombotic occlusion of proximal LAD. Underwent PCI of LAD. Developed cardiogenic shock requiring milrinone.  ECHO EF 30-35% with apical aneursym. Mod AS. Minimal MR. No evidence VSD.   Assessment/Plan    1. Acute systolic HF/cardiogenic shock - EF 30-35% with apical aneurysm. RV ok. - Milrinone stopped 12/18. Co-ox 75%.  - Renal function stable. CVP 8. Start lasix 40 mg po daily.  - Continue digoxin 0.125 - Continue eplerenone 12.5 mg daily - BP soft but improved on midodrine 23m tid.  - No b-blocker or ARB yet with SBPs in 80s  2. CAD with acute anterior MI - s/p PCI LAD - has CTO RCA with LCX -> RCA collaterals -  continue DAPT/statin  - no b-blocker yet with low BP. Hopefully we can add soon  - CR consulted   3. Tobacco use - cessation advised   4. Hypokalemia - K 3.9 stable.   5. Depression - Celexa 20 mg daily.    6. Ankylosing spondylitis with uveitis - gets remicade q 6 weeks. Next dose due 05/31/19  7. Retinal disease (central serous chorioretinopathy) - on eplerenone at home (spiro switched to eplerenone here)   Length of Stay: 6  Amy Clegg, NP  05/03/2019,  10:30 AM  Advanced Heart Failure Team Pager [phone](M-F; [facility]  Please contact [email or web address] for night-coverage after hours (4p -7a ) and weekends on [email or web address]  Patient seen and examined with the above-signed Advanced Practice Provider and/or Housestaff. I personally reviewed laboratory data, imaging studies and relevant notes. I independently examined the patient and formulated the important aspects of the plan. I have edited the note to reflect any of my changes or salient points. I have personally discussed the plan with the patient and/or family.  Developed orthopnea last night and treated acutely with IV lasix. Out > 1L. Breathing now better. No CP. Co-ox stable at 75%. BP remains soft and requires midodrine.   Still very emotional. Met with SW today. Celexa increased to 20 yesterday.   Will continue to ambulated and adjust HF meds as BP  tolerates. Agree with addition of low-dose lasix.   Glori Bickers, MD  4:59 PM

## 2019-05-03 NOTE — Progress Notes (Signed)
Patient voided over 1 Liter of urine after 20mg  dose of Lasix was given and he stated he feels like a new person. His breathing is much easier, sats are 99% on 3L/[place], b/p 98/72, and he is resting comfortably in bed. Monitoring closely.

## 2019-05-04 LAB — CBC
HCT: 35.8 % — ABNORMAL LOW (ref 39.0–52.0)
Hemoglobin: 12.2 g/dL — ABNORMAL LOW (ref 13.0–17.0)
MCH: 31.8 pg (ref 26.0–34.0)
MCHC: 34.1 g/dL (ref 30.0–36.0)
MCV: 93.2 fL (ref 80.0–100.0)
Platelets: 187 10*3/uL (ref 150–400)
RBC: 3.84 MIL/uL — ABNORMAL LOW (ref 4.22–5.81)
RDW: 12.7 % (ref 11.5–15.5)
WBC: 7.3 10*3/uL (ref 4.0–10.5)
nRBC: 0 % (ref 0.0–0.2)

## 2019-05-04 LAB — BASIC METABOLIC PANEL
Anion gap: 8 (ref 5–15)
BUN: 12 mg/dL (ref 8–23)
CO2: 26 mmol/L (ref 22–32)
Calcium: 8.7 mg/dL — ABNORMAL LOW (ref 8.9–10.3)
Chloride: 97 mmol/L — ABNORMAL LOW (ref 98–111)
Creatinine, Ser: 0.74 mg/dL (ref 0.61–1.24)
GFR calc Af Amer: >60 mL/min (ref >60)
GFR calc non Af Amer: >60 mL/min (ref >60)
Glucose, Bld: 96 mg/dL (ref 70–99)
Potassium: 3.8 mmol/L (ref 3.5–5.1)
Sodium: 131 mmol/L — ABNORMAL LOW (ref 135–145)

## 2019-05-04 LAB — DIGOXIN LEVEL: Digoxin Level: 0.2 ng/mL — ABNORMAL LOW (ref 0.8–2.0)

## 2019-05-04 LAB — COOXEMETRY PANEL
Carboxyhemoglobin: 1.2 % (ref 0.5–1.5)
Methemoglobin: 0.7 % (ref 0.0–1.5)
O2 Saturation: 69.3 %
Total hemoglobin: 12.5 g/dL (ref 12.0–16.0)

## 2019-05-04 MED ORDER — FUROSEMIDE 20 MG PO TABS: 20.0000 mg | ORAL_TABLET | Freq: Every day | ORAL | 3 refills | Status: DC

## 2019-05-04 MED ORDER — TICAGRELOR 90 MG PO TABS: 90.0000 mg | ORAL_TABLET | Freq: Two times a day (BID) | ORAL | 3 refills | Status: DC

## 2019-05-04 MED ORDER — ASPIRIN 81 MG PO CHEW: 81.0000 mg | CHEWABLE_TABLET | Freq: Every day | ORAL | 3 refills | Status: DC

## 2019-05-04 MED ORDER — EPLERENONE 25 MG PO TABS: 12.5000 mg | ORAL_TABLET | Freq: Every day | ORAL | 3 refills | Status: DC

## 2019-05-04 MED ORDER — MIDODRINE HCL 5 MG PO TABS: 5.0000 mg | ORAL_TABLET | Freq: Three times a day (TID) | ORAL | 1 refills | Status: DC

## 2019-05-04 MED ORDER — DIGOXIN 125 MCG PO TABS: 0.1250 mg | ORAL_TABLET | Freq: Every day | ORAL | 3 refills | Status: DC

## 2019-05-04 MED ORDER — CITALOPRAM HYDROBROMIDE 10 MG PO TABS: 10.0000 mg | ORAL_TABLET | Freq: Every day | ORAL | 3 refills | Status: DC

## 2019-05-04 MED FILL — BRILINTA 90 MG TABLET: 90 | 30 days supply | Qty: 60 | Fill #0

## 2019-05-04 MED FILL — FUROSEMIDE 20 MG TAB: 20 | 30 days supply | Qty: 30 | Fill #0

## 2019-05-04 MED FILL — DIGOXIN 0.125 MG TABLET: 125 | 30 days supply | Qty: 30 | Fill #0

## 2019-05-04 MED FILL — CITALOPRAM HBR 20 MG TABLET: 20 | 30 days supply | Qty: 15 | Fill #0

## 2019-05-04 MED FILL — ASPIRIN LOW DOSE 81 MG CHEW: 81 | 30 days supply | Qty: 30 | Fill #0

## 2019-05-04 MED FILL — MIDODRINE HCL 5 MG TABLET: 5 | 30 days supply | Qty: 90 | Fill #0

## 2019-05-04 NOTE — Plan of Care (Signed)
  Problem: Health Behavior/Discharge Planning: Goal: Ability to manage health-related needs will improve Outcome: Completed/Met   Problem: Education: Goal: Understanding of CV disease, CV risk reduction, and recovery process will improve Outcome: Completed/Met Goal: Individualized Educational Video(s) Outcome: Completed/Met   Problem: Activity: Goal: Ability to return to baseline activity level will improve Outcome: Completed/Met   Problem: Cardiovascular: Goal: Ability to achieve and maintain adequate cardiovascular perfusion will improve Outcome: Completed/Met Goal: Vascular access site(s) Level 0-1 will be maintained Outcome: Completed/Met   Problem: Health Behavior/Discharge Planning: Goal: Ability to safely manage health-related needs after discharge will improve Outcome: Completed/Met   Problem: Education: Goal: Knowledge of General Education information will improve Description: Including pain rating scale, medication(s)/side effects and non-pharmacologic comfort measures Outcome: Completed/Met   Problem: Health Behavior/Discharge Planning: Goal: Ability to manage health-related needs will improve Outcome: Completed/Met   Problem: Clinical Measurements: Goal: Ability to maintain clinical measurements within normal limits will improve Outcome: Completed/Met Goal: Will remain free from infection Outcome: Completed/Met Goal: Diagnostic test results will improve Outcome: Completed/Met Goal: Respiratory complications will improve Outcome: Completed/Met Goal: Cardiovascular complication will be avoided Outcome: Completed/Met   Problem: Activity: Goal: Risk for activity intolerance will decrease Outcome: Completed/Met   Problem: Nutrition: Goal: Adequate nutrition will be maintained Outcome: Completed/Met   Problem: Coping: Goal: Level of anxiety will decrease Outcome: Completed/Met   Problem: Elimination: Goal: Will not experience complications related to  bowel motility Outcome: Completed/Met Goal: Will not experience complications related to urinary retention Outcome: Completed/Met   Problem: Pain Managment: Goal: General experience of comfort will improve Outcome: Completed/Met   Problem: Safety: Goal: Ability to remain free from injury will improve Outcome: Completed/Met   Problem: Skin Integrity: Goal: Risk for impaired skin integrity will decrease Outcome: Completed/Met

## 2019-05-04 NOTE — Progress Notes (Signed)
CARDIAC REHAB PHASE I   PRE:  Rate/Rhythm: 98 SR  BP:  Supine:   Sitting: 97/69  Standing:    SaO2: 97%RA  MODE:  Ambulation: 340 ft   POST:  Rate/Rhythm: 101 ST  BP:  Supine:   Sitting: 107/76  Standing:    SaO2: 99%RA 1051-1111 Pt  walked 340 ft on RA with hand held asst. Distance limited by leg pain from back issues. No c/o SOB and no DOE noted. Pt stated he normally takes motrin for back pain but has not been able to now since on brilinta. Encouraged pt to walk as tolerated at home, shorter distances but more frequently. Pt did not think he needed walker or cane. Pt has educational materials on CHF and MI.   Graylon Good, RN BSN  05/04/2019 11:07 AM

## 2019-05-04 NOTE — Plan of Care (Signed)
Patient is progressing as expected.

## 2019-05-04 NOTE — Progress Notes (Signed)
CSW met with patient at bedside and states that he is going home today. Patient states he is feeling better but realizes that it will take time for healing and making the necessary changes in his lifestyle. Patient shared that he is used to "fixing things' and moving on but this is different... it will take time. CSW provided supportive intervention and will follow up with patient in the outpatient setting with added support referrals for nutrition and exercise options. Patient appears with improved mood and feeling positive about discharge home. CSW will follow up in the AHF Clinic post discharge. [name], [place], [place]

## 2019-05-04 NOTE — Discharge Summary (Addendum)
Advanced Heart Failure Team  Discharge Summary   Patient ID: Brandon [name] MRN: [id number], DOB/AGE: 10-12-1955 63 y.o. Admit date: 04/27/2019 D/C date:     05/04/2019   Primary Discharge Diagnoses:  1. Acute systolic HF/cardiogenic shock 2. CAD with acute anterior MI 3. Tobacco use 4. Hypokalemia 5. Depression 6. Ankylosing spondylitis with uveitis 7. Retinal disease (central serous chorioretinopathy) 8. ETOH   Hospital Course:  63 y/o smoker (originally from [place], [place]) with h/o CAD s/p previous MI, HTN, HL admitted earlier today with several days of stuttering CP. Initial ECG showed anterior ST elevation. HsTrop 476 -> >27k. Taken to cath lab which showed chronically occluded RCA (with collaterals from RCA) and thrombotic occlusion of proximal LAD. Underwent PCI of LAD.  After cath placed on milrinone with gradual wean as he improved.   HF medications initiated but limited by blood pressure. Placed on midodrine to maintain blood pressure. Hopefully he can wean as he improved.   See below for detailed problems list. He will contine to be followed closely in the HF clinic. He has been referred to cardiac rehab. We will check labs next week. Today medications filled through [facility].    1. Acute systolic HF/cardiogenic shock - EF 30-35% with apical aneurysm. RV ok. - Milrinone stopped 12/18. CO-OX remains stable 69%.  - Diuresed with IV lasix and later transitioned to po lasix 20 mg daily.   - Continue digoxin 0.125 - Continue eplerenone 12.5 mg daily - BP soft but improved on midodrine 5mg  tid. Continue for now.  - No b-blocker or ARB yet with SBPs in 80s   2. CAD with acute anterior MI - s/p PCI LAD - has CTO RCA with LCX -> RCA collaterals -  continue DAPT/statin. Given 30 day free card for brillinta.  - no b-blocker yet with low BP. - CR consulted and appreciated.  - Plan to start cardiac rehab.    3. Tobacco use - cessation advised    4. Hypokalemia -  Potassium followed closely and replaced as needed.    5. Depression - Celexa 20 mg daily.     6. Ankylosing spondylitis with uveitis - gets remicade q 6 weeks. Next dose due 05/31/19   7. Retinal disease (central serous chorioretinopathy) - on eplerenone at home (spiro switched to eplerenone here)    8. ETOH  At home he drinks 6 beers a night. Counseled to avoid alcohol.   Discharge Weight: 196 pounds  Discharge Vitals: Blood pressure (!) 89/60, pulse 95, temperature 97.8 F (36.6 C), temperature source Oral, resp. rate (!) 34, height 6' (1.829 m), weight 89.3 kg, SpO2 96 %.  Labs: Lab Results  Component Value Date   WBC 7.3 05/04/2019   HGB 12.2 (L) 05/04/2019   HCT 35.8 (L) 05/04/2019   MCV 93.2 05/04/2019   PLT 187 05/04/2019    Recent Labs  Lab 04/27/19 2036 05/04/19 0541  NA 132* 131*  K 4.4 3.8  CL 100 97*  CO2 19* 26  BUN 15 12  CREATININE 0.99 0.74  CALCIUM 9.2 8.7*  PROT 7.0  --   BILITOT 1.2  --   ALKPHOS 91  --   ALT 103*  --   AST 480*  --   GLUCOSE 132* 96   Lab Results  Component Value Date   CHOL 171 04/28/2019   HDL 63 04/28/2019   LDLCALC 93 04/28/2019   TRIG 73 04/28/2019   BNP (last 3 results) No results for input(s):  BNP in the last 8760 hours.  ProBNP (last 3 results) No results for input(s): PROBNP in the last 8760 hours.   Diagnostic Studies/Procedures   DG Chest Port 1 View  Result Date: 05/03/2019 CLINICAL DATA:  Chest pain EXAM: PORTABLE CHEST 1 VIEW COMPARISON:  01/22/2012 FINDINGS: Heart size is borderline enlarged. There is a well-positioned right-sided PICC line. There are prominent interstitial lung markings bilaterally. The lungs are hyperexpanded. There may be trace to small bilateral pleural effusions. There is no pneumothorax. IMPRESSION: 1. Prominent interstitial lung markings which are nonspecific and may be secondary to developing interstitial edema. 2. Borderline cardiomegaly. 3. Well-positioned right PICC line.  Electronically Signed   By: Constance Holster M.D.   On: 05/03/2019 00:10   04/27/19  RHC/LHC  Occluded LAD- restented  RA 3 RV 37/5 PA 31/8 (16) PCWP 18 PA sat 63%  Fick 4.1/1.9 SVR 1580  CPO 0.75  Lactate 1.7   LHC 04/27/19  Prox RCA to Mid RCA lesion is 100% stenosed. THis is a chronic total occlusion. Left to right collaterals. Prox Cx lesion is 50% stenosed. Mid LAD lesion is 100% stenosed. A drug-eluting stent was successfully placed using a STENT SYNERGY DES 3.5X16. Post intervention, there is a 0% residual stenosis. LV end diastolic pressure is moderately elevated. There is no aortic valve stenosis.     Discharge Medications   Allergies as of 05/04/2019   No Known Allergies      Medication List     STOP taking these medications    atenolol 50 MG tablet Commonly known as: TENORMIN   ibuprofen 800 MG tablet Commonly known as: ADVIL   olmesartan 40 MG tablet Commonly known as: BENICAR   sildenafil 50 MG tablet Commonly known as: Viagra   varenicline 0.5 MG X 11 & 1 MG X 42 tablet Commonly known as: Chantix Starting Month Pak   varenicline 1 MG tablet Commonly known as: Chantix Continuing Month Pak       TAKE these medications    aspirin 81 MG chewable tablet Chew 1 tablet (81 mg total) by mouth daily. Start taking on: May 05, 2019   atorvastatin 80 MG tablet Commonly known as: LIPITOR TAKE 1 TABLET BY MOUTH ONCE DAILY AT 6PM. What changed:  how much to take how to take this when to take this additional instructions   citalopram 10 MG tablet Commonly known as: CELEXA Take 1 tablet (10 mg total) by mouth daily. Start taking on: May 05, 2019   digoxin 0.125 MG tablet Commonly known as: LANOXIN Take 1 tablet (0.125 mg total) by mouth daily. Start taking on: May 05, 2019   eplerenone 25 MG tablet Commonly known as: INSPRA Take 0.5 tablets (12.5 mg total) by mouth daily. Start taking on: May 05, 2019    furosemide 20 MG tablet Commonly known as: LASIX Take 1 tablet (20 mg total) by mouth daily. Start taking on: May 05, 2019   inFLIXimab in sodium chloride 0.9 % Inject into the vein every 6 (six) weeks.   midodrine 5 MG tablet Commonly known as: PROAMATINE Take 1 tablet (5 mg total) by mouth 3 (three) times daily with meals.   tamsulosin 0.4 MG Caps capsule Commonly known as: FLOMAX TAKE 1 CAPSULE(0.4 MG) BY MOUTH DAILY What changed: See the new instructions.   ticagrelor 90 MG Tabs tablet Commonly known as: BRILINTA Take 1 tablet (90 mg total) by mouth 2 (two) times daily.        Disposition  The patient will be discharged in stable condition to home. Discharge Instructions     (HEART FAILURE PATIENTS) Call MD:  Anytime you have any of the following symptoms: 1) 3 pound weight gain in 24 hours or 5 pounds in 1 week 2) shortness of breath, with or without a dry hacking cough 3) swelling in the hands, feet or stomach 4) if you have to sleep on extra pillows at night in order to breathe.   Complete by: As directed    Amb Referral to Cardiac Rehabilitation   Complete by: As directed    Diagnosis:  STEMI Coronary Stents     After initial evaluation and assessments completed: Virtual Based Care may be provided alone or in conjunction with Phase 2 Cardiac Rehab based on patient barriers.: Yes   Diet - low sodium heart healthy   Complete by: As directed    Increase activity slowly   Complete by: As directed       Follow-up Information     [facility] Follow up on 05/20/2019.   Specialty: Cardiology Why: at 3:30 [place] information: [address] [id number] [place] [phone]             Duration of Discharge Encounter: Greater than 35 minutes   Signed, Darrick Grinder NP-C  05/04/2019, 1:48 PM  Patient seen and examined with the above-signed Advanced Practice  Provider and/or Housestaff. I personally reviewed laboratory data, imaging studies and relevant notes. I independently examined the patient and formulated the important aspects of the plan. I have edited the note to reflect any of my changes or salient points. I have personally discussed the plan with the patient and/or family.  He is improved. Walking halls without difficulty. [place] for discharge today. F/u in HF Clinic.   Glori Bickers, MD  6:40 PM

## 2019-05-04 NOTE — Progress Notes (Signed)
Discharge papers printed for patient and discussed. Patient stated he understood all instructions and personal belongings given to patient. Patient was taken by automobile by his sister too his house.

## 2019-05-04 NOTE — TOC Progression Note (Signed)
Transition of Care [facility]) - Progression Note    Patient Details  Name: Brandon [name] MRN: [id number] Date of Birth: [birth year]  [facility] CM/SW Contact  Zenon Mayo, RN Phone Number: 05/04/2019, 1:36 PM  Clinical Narrative:    Patient will be on brilinta for the first 30 days and TOC pharmacy is filling this for him, MD will change him to different medication on follow up per Staff RN due to high co pay, she spoke with Amy NP.          Expected Discharge Plan and Services                                                 Social Determinants of Health (SDOH) Interventions    Readmission Risk Interventions No flowsheet data found.

## 2019-05-04 NOTE — Progress Notes (Addendum)
Advanced Heart Failure Rounding Note  PCP-Cardiologist: [name], MD  AHF: Dr. [name]   Subjective:    63 y/o smoker (originally from [place], [place]) with h/o CAD s/p previous MI, HTN, and HLD admitted for acute anterior STEMI. Cath w/ chronically occluded RCA (with collaterals from RCA) and thrombotic occlusion of proximal LAD. Underwent PCI of LAD.   - ECHO EF 30-35% with apical aneursym. Mod AS. Minimal MR. No evidence VSD.   Milrinone stopped on 12/19. Midodrine added for low BP.   Denies chest pain. Denies shortness of breath. Wants to go home. Able to wak 350 feet.     Objective:   Weight Range: 89.3 kg Body mass index is 26.7 kg/m.   Vital Signs:   Temp:  [97.3 F (36.3 C)-97.9 F (36.6 C)] 97.6 F (36.4 C) (12/22 0733) Pulse Rate:  [83-96] 93 (12/22 1005) Resp:  [12-34] 34 (12/22 0733) BP: (84-104)/(56-74) 104/74 (12/22 0733) SpO2:  [96 %-98 %] 96 % (12/22 0733) Weight:  [89.3 kg] 89.3 kg (12/22 0500) Last BM Date: 05/02/19  Weight change: Filed Weights   05/02/19 0810 05/03/19 0500 05/04/19 0500  Weight: 91 kg 90.5 kg 89.3 kg    Intake/Output:   Intake/Output Summary (Last 24 hours) at 05/04/2019 1210 Last data filed at 05/04/2019 1158 Gross per 24 hour  Intake 720 ml  Output 1700 ml  Net -980 ml      Physical Exam  General:  Well appearing. No resp difficulty. Sitting in the chair  HEENT: normal anicteric  Neck: supple. no JVD. Carotids 2+ bilat; no bruits. No lymphadenopathy or thryomegaly appreciated. Cor: PMI nondisplaced. Regular rate & rhythm. No rubs, gallops or murmurs. Lungs: clear no wheeze Abdomen: soft, nontender, nondistended. No hepatosplenomegaly. No bruits or masses. Good bowel sounds. Extremities: no cyanosis, clubbing, rash, edema Neuro: alert & oriented x 3, cranial nerves grossly intact. moves all 4 extremities w/o difficulty. Affect pleasant   Telemetry   SR 80-90s personally checked.   Labs    CBC Recent  Labs    05/04/19 0541  WBC 7.3  HGB 12.2*  HCT 35.8*  MCV 93.2  PLT 235   Basic Metabolic Panel Recent Labs    05/03/19 0856 05/04/19 0541  NA 130* 131*  K 3.9 3.8  CL 97* 97*  CO2 26 26  GLUCOSE 127* 96  BUN 14 12  CREATININE 0.76 0.74  CALCIUM 8.8* 8.7*   Liver Function Tests No results for input(s): AST, ALT, ALKPHOS, BILITOT, PROT, ALBUMIN in the last 72 hours. No results for input(s): LIPASE, AMYLASE in the last 72 hours. Cardiac Enzymes No results for input(s): CKTOTAL, CKMB, CKMBINDEX, TROPONINI in the last 72 hours.  BNP: BNP (last 3 results) No results for input(s): BNP in the last 8760 hours.  ProBNP (last 3 results) No results for input(s): PROBNP in the last 8760 hours.   D-Dimer No results for input(s): DDIMER in the last 72 hours. Hemoglobin A1C No results for input(s): HGBA1C in the last 72 hours. Fasting Lipid Panel No results for input(s): CHOL, HDL, LDLCALC, TRIG, CHOLHDL, LDLDIRECT in the last 72 hours. Thyroid Function Tests No results for input(s): TSH, T4TOTAL, T3FREE, THYROIDAB in the last 72 hours.  Invalid input(s): FREET3  Other results:   Imaging    No results found.   Medications:     Scheduled Medications: . aspirin  81 mg Oral Daily  . atorvastatin  80 mg Oral q1800  . Chlorhexidine Gluconate Cloth  6  each Topical Daily  . citalopram  10 mg Oral Daily  . digoxin  0.125 mg Oral Daily  . enoxaparin (LOVENOX) injection  40 mg Subcutaneous Q24H  . eplerenone  12.5 mg Oral Daily  . furosemide  20 mg Oral Daily  . midodrine  5 mg Oral TID WC  . nicotine  21 mg Transdermal Daily  . tamsulosin  0.4 mg Oral Daily  . ticagrelor  90 mg Oral BID    Infusions:   PRN Medications: acetaminophen, albuterol, ALPRAZolam, alum & mag hydroxide-simeth, fentaNYL (SUBLIMAZE) injection, ondansetron (ZOFRAN) IV, sodium chloride flush, zolpidem    Patient Profile   63 y/o smoker (originally from [place], [place]) with h/o CAD s/p  previous MI, HTN, and HLD admitted for acute anterior STEMI. Cath w/ chronically occluded RCA (with collaterals from RCA) and thrombotic occlusion of proximal LAD. Underwent PCI of LAD. Developed cardiogenic shock requiring milrinone.  ECHO EF 30-35% with apical aneursym. Mod AS. Minimal MR. No evidence VSD.   Assessment/Plan    1. Acute systolic HF/cardiogenic shock - EF 30-35% with apical aneurysm. RV ok. - Milrinone stopped 12/18. CO-OX remains stable 69%.  - Volume status stable. Continue lasix 20 mg daily.  - Continue digoxin 0.125 - Continue eplerenone 12.5 mg daily - BP soft but improved on midodrine 5mg  tid. Continue for now.  - No b-blocker or ARB yet with SBPs in 80s  2. CAD with acute anterior MI - s/p PCI LAD - has CTO RCA with LCX -> RCA collaterals -  continue DAPT/statin. Given 30 day free card for brillinta.  - no b-blocker yet with low BP. - CR consulted and appreciated.   3. Tobacco use - cessation advised   4. Hypokalemia - K 3.8  5. Depression - Celexa 20 mg daily.    6. Ankylosing spondylitis with uveitis - gets remicade q 6 weeks. Next dose due 05/31/19  7. Retinal disease (central serous chorioretinopathy) - on eplerenone at home (spiro switched to eplerenone here)   8. ETOH  At home he drinks 6 beers a night. Counseled to avoid alcohol.   Home today. Follow up in the HF clinic has been set up.  Length of Stay: [place], NP  05/04/2019, 12:10 PM  Advanced Heart Failure Team Pager [phone] (M-F; [place])  Please contact [facility] for night-coverage after hours (4p -7a ) and weekends on [email or web address]   Patient seen and examined with Darrick Grinder, NP. We discussed all aspects of the encounter. I agree with the assessment and plan as stated above.   He is much improved. No further CP, orthopnea or PND. Agree with discharge today with close f/u. Agree with meds above.  Glori Bickers, MD  3:54 PM

## 2019-05-05 ENCOUNTER — Telehealth (HOSPITAL_COMMUNITY): Payer: Self-pay | Admitting: *Deleted

## 2019-05-05 ENCOUNTER — Other Ambulatory Visit (HOSPITAL_COMMUNITY): Payer: Self-pay

## 2019-05-05 ENCOUNTER — Telehealth: Payer: Self-pay

## 2019-05-05 MED ORDER — FUROSEMIDE 20 MG PO TABS: 40.0000 mg | ORAL_TABLET | Freq: Every day | ORAL | 0 refills | Status: DC

## 2019-05-05 NOTE — Telephone Encounter (Signed)
Pt called stating he was discharged from the hospital yesterday but when he got home he had very labored breathing and only slept for one hour. He said his breathing is still labored. Pt denies any chest pain, swelling, or any other complaints at this time. Pt said breathing is only labored when he tries to relax. When he is talking to someone he feels fine. Pt said he feels as if he isnt getting a full breath and it feels like he really needs to burp.   Routed to Amy for advice

## 2019-05-05 NOTE — Telephone Encounter (Signed)
Please call and ask him to take lasix 40 mg po daily.   He needs to set up follow up with PCP.    Klein Willcox NP-C  3:30 PM

## 2019-05-05 NOTE — Telephone Encounter (Signed)
Transition Care Management Follow-up Telephone Call  Date of discharge and from where: 05/04/2019 from [facility]  How have you been since you were released from the hospital? "I'm feeling much better".   Any questions or concerns? No   Items Reviewed:  Did the pt receive and understand the discharge instructions provided? Yes   Medications obtained and verified? Yes   Any new allergies since your discharge? No   Dietary orders reviewed? Yes  Do you have support at home? Yes   Other (ie: DME, Home Health, etc) no  Functional Questionnaire: (I = Independent and D = Dependent) ADL's: Patient is independent with all ADL's at this time.  Bathing/Dressing-    Meal Prep-   Eating-   Maintaining continence-   Transferring/Ambulation-   Managing Meds-    Follow up appointments reviewed:    PCP Hospital f/u appt confirmed? Yes  Scheduled to see Dr. Volanda Napoleon via phone visit 05/12/19 [facility] f/u appt confirmed? Yes  Scheduled to see cardiology 05/20/19 3:30pm.  Are transportation arrangements needed? No   If their condition worsens, is the pt aware to call  their PCP or go to the ED? Yes  Was the patient provided with contact information for the PCP's office or ED? Yes  Was the pt encouraged to call back with questions or concerns? Yes

## 2019-05-05 NOTE — Telephone Encounter (Signed)
Patient advised and verbalized understanding. Patient will keep pending appt with pcp on 12/30. New Rx sent in for dose increase

## 2019-05-10 ENCOUNTER — Telehealth (HOSPITAL_COMMUNITY): Payer: Self-pay

## 2019-05-10 NOTE — Telephone Encounter (Signed)
Attempted to call patient in regards to Cardiac Rehab - LM on VM 

## 2019-05-11 ENCOUNTER — Encounter: Payer: Self-pay | Admitting: Family Medicine

## 2019-05-12 ENCOUNTER — Ambulatory Visit (INDEPENDENT_AMBULATORY_CARE_PROVIDER_SITE_OTHER): Payer: Medicare Other | Admitting: Family Medicine

## 2019-05-12 DIAGNOSIS — F17210 Nicotine dependence, cigarettes, uncomplicated: Secondary | ICD-10-CM | POA: Diagnosis not present

## 2019-05-12 DIAGNOSIS — I5021 Acute systolic (congestive) heart failure: Secondary | ICD-10-CM

## 2019-05-12 DIAGNOSIS — Z09 Encounter for follow-up examination after completed treatment for conditions other than malignant neoplasm: Secondary | ICD-10-CM

## 2019-05-12 DIAGNOSIS — I2109 ST elevation (STEMI) myocardial infarction involving other coronary artery of anterior wall: Secondary | ICD-10-CM

## 2019-05-12 DIAGNOSIS — F339 Major depressive disorder, recurrent, unspecified: Secondary | ICD-10-CM

## 2019-05-12 NOTE — Progress Notes (Signed)
Virtual Visit via Video Note  I connected with Brandon [name] on 05/12/19 at  2:00 PM EST by a video enabled telemedicine application 2/2 [id number] pandemic and verified that I am speaking with the correct person using two identifiers.  Location patient: home Location provider:work or home office Persons participating in the virtual visit: patient, provider  I discussed the limitations of evaluation and management by telemedicine and the availability of in person appointments. The patient expressed understanding and agreed to proceed.   HPI: Pt is a 63 yo male with pmh sig for h/o CAD, previous MI, HTN, HLD  Pt admitted 79/89-21/19/41 for acute systolic CHF/cardiogenic shock 2/2 CAD with acute MI s/p PCI LAD.  Anterior ST elevation on EKG and elevated troponin noted.  Cardiology consulted, pt taken to cath lab--chronically occluded RCA (with colaterals from RCA) and thrombotic occlusion of proximal LAD.  PCI of LAD completed.  Milrinone weaned.  Due to hypotension, pt started on midodrine.  Unable to tolerate BB or ARB due to bp.  Pt has been cigarette free x 16 days.  Not having to use the patches.  States feels like his strength is improving daily.  Pt is cooking at home.  Eating 3 meals per day, but notes appetite is not were it was.   Sticking to a 2 L fluid restriction.  Notes no SOB or LE edema.  Weight 192 lbs this am.  BP stable, 120/76 and 101/70 today.  Checking bp QID.  Pt on remicaid for Ankylosing spondylitis with uveitis.  Has an upcoming appt with Rheum.  ROS: See pertinent positives and negatives per HPI.  Past Medical History:  Diagnosis Date  . Ankylosing spondylitis ([place])   . Arthritis   . BENIGN PROSTATIC HYPERTROPHY 06/07/2008  . COLONIC POLYPS, HX OF 06/07/2008  . H/O hiatal hernia   . Heart murmur   . HYPERLIPIDEMIA 06/07/2008  . HYPERTENSION 06/07/2008  . Myocardial infarction [facility]) 2005   NSTEMI, s/p LAD stent  . NEPHROLITHIASIS, HX OF 06/07/2008  . STEMI (ST  elevation myocardial infarction) ([place]) 04/27/2019    Past Surgical History:  Procedure Laterality Date  . CORONARY ANGIOPLASTY WITH STENT PLACEMENT  2005   LAD stent, jailed diagonal  . CORONARY/GRAFT ACUTE MI REVASCULARIZATION N/A 04/27/2019   Procedure: Coronary/Graft Acute MI Revascularization;  Surgeon: [name], MD;  Location: [place] CV [facility];  Service: Cardiovascular;  Laterality: N/A;  . LAMINECTOMY     lumbar  . LEFT HEART CATH AND CORONARY ANGIOGRAPHY N/A 04/27/2019   Procedure: LEFT HEART CATH AND CORONARY ANGIOGRAPHY;  Surgeon: [name], MD;  Location: [place] CV [facility];  Service: Cardiovascular;  Laterality: N/A;  . LUMBAR FUSION  01/2012  . RIGHT HEART CATH N/A 04/27/2019   Procedure: RIGHT HEART CATH;  Surgeon: [name], Jamarien M, MD;  Location: [place] [facility];  Service: Cardiovascular;  Laterality: N/A;  . TONSILLECTOMY    . warthin tumor removal Left 2014    Family History  Problem Relation Age of Onset  . Depression Father   . Arthritis Sister        RA  . Heart failure Mother   . Other Neg Hx   . Colon cancer Neg Hx   . Esophageal cancer Neg Hx   . Rectal cancer Neg Hx   . Stomach cancer Neg Hx      Current Outpatient Medications:  .  aspirin 81 MG chewable tablet, Chew 1 tablet (81 mg total) by mouth daily., Disp:  30 tablet, Rfl: 3 .  atorvastatin (LIPITOR) 80 MG tablet, TAKE 1 TABLET BY MOUTH ONCE DAILY AT 6PM. (Patient taking differently: Take 80 mg by mouth daily at 6 PM. ), Disp: 90 tablet, Rfl: 3 .  citalopram (CELEXA) 10 MG tablet, Take 1 tablet (10 mg total) by mouth daily., Disp: 30 tablet, Rfl: 3 .  digoxin (LANOXIN) 0.125 MG tablet, Take 1 tablet (0.125 mg total) by mouth daily., Disp: 30 tablet, Rfl: 3 .  eplerenone (INSPRA) 25 MG tablet, Take 0.5 tablets (12.5 mg total) by mouth daily., Disp: 30 tablet, Rfl: 3 .  furosemide (LASIX) 20 MG tablet, Take 2 tablets (40 mg total) by mouth daily., Disp: 180 tablet, Rfl: 0 .   inFLIXimab in sodium chloride 0.9 %, Inject into the vein every 6 (six) weeks., Disp: , Rfl:  .  midodrine (PROAMATINE) 5 MG tablet, Take 1 tablet (5 mg total) by mouth 3 (three) times daily with meals., Disp: 90 tablet, Rfl: 1 .  tamsulosin (FLOMAX) 0.4 MG CAPS capsule, TAKE 1 CAPSULE(0.4 MG) BY MOUTH DAILY (Patient taking differently: Take 0.4 mg by mouth daily. ), Disp: 90 capsule, Rfl: 1 .  ticagrelor (BRILINTA) 90 MG TABS tablet, Take 1 tablet (90 mg total) by mouth 2 (two) times daily., Disp: 60 tablet, Rfl: 3  EXAM:  VITALS per patient if applicable:  RR between 12-20 bpm  Wt 192 lbs.  BP 101/70  GENERAL: alert, oriented, appears well and in no acute distress  HEENT: atraumatic, conjunctiva clear, no obvious abnormalities on inspection of external nose and ears  NECK: normal movements of the head and neck  LUNGS: on inspection no signs of respiratory distress, breathing rate appears normal, no obvious gross SOB, gasping or wheezing  CV: no obvious cyanosis  MS: moves all visible extremities without noticeable abnormality  PSYCH/NEURO: pleasant and cooperative, no obvious depression or anxiety, speech and thought processing grossly intact  ASSESSMENT AND PLAN:  Discussed the following assessment and plan:  Hospital discharge follow up -phone call reviewed -notes and labs from admission reviewed  Acute anterior wall MI ([place]) -stable -s/p PCI LAD 04/27/19 -continue Brillinta, ASA 81 mg, Lipitor 80 mg -continue to monitor bp.  Unable to add BB 2/2 low bp. -continue heart healthy diet -continue f/u with Cardiology and Cardiac Rehab  Acute systolic heart failure (HCC) -EF 30-35% with apical aneurysm. -continue lasix 20 mg daily -continue other meds: digoxin 0.125, eplerenone 12.5 mg, and midodrine 5 mg -continue fluid and sodium restrictions. -continue f/u with Cardiology  Depression, recurrent ([place]) -stable -continue celexa 10 mg  Cigarette nicotine dependence  without complication -smoking cessation counseling >3 min, <10 min -pt smoke free x 16 days.  Congratulated -not currently requiring patches -continue to monitor  F/u in 1-2 months prn   I discussed the assessment and treatment plan with the patient. The patient was provided an opportunity to ask questions and all were answered. The patient agreed with the plan and demonstrated an understanding of the instructions.   The patient was advised to call back or seek an in-person evaluation if the symptoms worsen or if the condition fails to improve as anticipated.  [name], MD

## 2019-05-12 NOTE — Telephone Encounter (Signed)
I called the pt and he stated he has an appt for today at 2pm and cannot drive due to having a heart attack.  I advised the pt there is a note in his appt stating the visit will be done via phone and message sent to PCP as FYI.

## 2019-05-17 ENCOUNTER — Encounter: Payer: Self-pay | Admitting: Family Medicine

## 2019-05-20 ENCOUNTER — Encounter: Payer: Self-pay | Admitting: Family Medicine

## 2019-05-20 ENCOUNTER — Encounter (HOSPITAL_COMMUNITY): Payer: Self-pay

## 2019-05-20 ENCOUNTER — Ambulatory Visit (HOSPITAL_COMMUNITY)
Admission: RE | Admit: 2019-05-20 | Discharge: 2019-05-20 | Disposition: A | Discharge status: Home / Self Care | Payer: Medicare Other | Source: Ambulatory Visit | Attending: Cardiology | Admitting: Cardiology

## 2019-05-20 ENCOUNTER — Other Ambulatory Visit: Payer: Self-pay

## 2019-05-20 VITALS — BP 130/80 | HR 121 | Wt 191.0 lb

## 2019-05-20 DIAGNOSIS — I2510 Atherosclerotic heart disease of native coronary artery without angina pectoris: Secondary | ICD-10-CM | POA: Insufficient documentation

## 2019-05-20 DIAGNOSIS — Z955 Presence of coronary angioplasty implant and graft: Secondary | ICD-10-CM | POA: Insufficient documentation

## 2019-05-20 DIAGNOSIS — Z7982 Long term (current) use of aspirin: Secondary | ICD-10-CM | POA: Insufficient documentation

## 2019-05-20 DIAGNOSIS — I5022 Chronic systolic (congestive) heart failure: Secondary | ICD-10-CM | POA: Diagnosis present

## 2019-05-20 DIAGNOSIS — I255 Ischemic cardiomyopathy: Secondary | ICD-10-CM | POA: Diagnosis not present

## 2019-05-20 DIAGNOSIS — Z8249 Family history of ischemic heart disease and other diseases of the circulatory system: Secondary | ICD-10-CM | POA: Diagnosis not present

## 2019-05-20 DIAGNOSIS — Z8719 Personal history of other diseases of the digestive system: Secondary | ICD-10-CM | POA: Insufficient documentation

## 2019-05-20 DIAGNOSIS — Z87442 Personal history of urinary calculi: Secondary | ICD-10-CM | POA: Insufficient documentation

## 2019-05-20 DIAGNOSIS — I252 Old myocardial infarction: Secondary | ICD-10-CM | POA: Insufficient documentation

## 2019-05-20 DIAGNOSIS — E785 Hyperlipidemia, unspecified: Secondary | ICD-10-CM | POA: Diagnosis not present

## 2019-05-20 DIAGNOSIS — Z79899 Other long term (current) drug therapy: Secondary | ICD-10-CM | POA: Insufficient documentation

## 2019-05-20 DIAGNOSIS — I110 Hypertensive heart disease with heart failure: Secondary | ICD-10-CM | POA: Diagnosis not present

## 2019-05-20 DIAGNOSIS — Z87891 Personal history of nicotine dependence: Secondary | ICD-10-CM | POA: Insufficient documentation

## 2019-05-20 DIAGNOSIS — Z9861 Coronary angioplasty status: Secondary | ICD-10-CM

## 2019-05-20 LAB — CBC
HCT: 44.6 % (ref 39.0–52.0)
Hemoglobin: 15 g/dL (ref 13.0–17.0)
MCH: 30.3 pg (ref 26.0–34.0)
MCHC: 33.6 g/dL (ref 30.0–36.0)
MCV: 90.1 fL (ref 80.0–100.0)
Platelets: 274 10*3/uL (ref 150–400)
RBC: 4.95 MIL/uL (ref 4.22–5.81)
RDW: 12.2 % (ref 11.5–15.5)
WBC: 8.3 10*3/uL (ref 4.0–10.5)
nRBC: 0 % (ref 0.0–0.2)

## 2019-05-20 LAB — BASIC METABOLIC PANEL
Anion gap: 13 (ref 5–15)
BUN: 13 mg/dL (ref 8–23)
CO2: 26 mmol/L (ref 22–32)
Calcium: 9.3 mg/dL (ref 8.9–10.3)
Chloride: 91 mmol/L — ABNORMAL LOW (ref 98–111)
Creatinine, Ser: 0.88 mg/dL (ref 0.61–1.24)
GFR calc Af Amer: >60 mL/min (ref >60)
GFR calc non Af Amer: >60 mL/min (ref >60)
Glucose, Bld: 103 mg/dL — ABNORMAL HIGH (ref 70–99)
Potassium: 3 mmol/L — ABNORMAL LOW (ref 3.5–5.1)
Sodium: 130 mmol/L — ABNORMAL LOW (ref 135–145)

## 2019-05-20 LAB — DIGOXIN LEVEL: Digoxin Level: 0.5 ng/mL — ABNORMAL LOW (ref 0.8–2.0)

## 2019-05-20 MED ORDER — MIDODRINE HCL 2.5 MG PO TABS: 2.5000 mg | ORAL_TABLET | Freq: Three times a day (TID) | ORAL | 3 refills | Status: DC

## 2019-05-20 MED ORDER — CARVEDILOL 3.125 MG PO TABS: 3.1250 mg | ORAL_TABLET | Freq: Two times a day (BID) | ORAL | 3 refills | Status: DC

## 2019-05-20 NOTE — Progress Notes (Signed)
Advanced Heart Failure Clinic Note   Referring Physician: PCP: [name], MD PCP-Cardiologist: [name], MD  [facility]: Dr. [name]   Reason for Visit: [facility] F/u s/p Anterior MI w/ Systolic Heart Failure   HPI: 64 y/o smoker (originally from [place], [place]) with h/o CAD s/p previous MI, HTN, HL admitted 04/27/19 with several days of stuttering CP. Initial ECG showed anterior ST elevation. HsTrop 476 -> >27k. Taken to cath lab which showed chronically occluded RCA (L>>R collaterals) and thrombotic occlusion of proximal LAD. Underwent PCI of LAD. Echo w/ reduced LVEF 30-35% w/ apical aneurysm. RV ok. Post cath, he required milrinone for cardiogenic shock. Diuresed w/ IV Lasix. Milrinone ultimately weaned off and Co-ox remained stable off milrinone at 69%. GDMT limited by hypotension. He required midodrine 5 mg tid. Was continued on eplerenone 12.5 mg daily (on prior to admit), digoxin added. Discharged home on lasix 20 mg daily. No BP room for ARB nor  blocker. For his CAD, he was placed on DAPT w/ ASA and Brilinta + high intensity statin, Lipitor 40 mg. He was discharged home on 05/04/19.  He presents to clinic for post hospital f/u. He feels "fair". Denies CP and no resting dyspnea and no exertional dyspnea w/ ADLs. Notes some mild fatigue. Wt has been steadily droping since discharge. Was 196 lb day of d/c. Weighed 189 lb at home this morning. BP 130/80. Remains on tid midodrine. HR elevated. EKG shows sinus tach 110 bpm. I have reviewed home vitals log. His HR as been in the 90s-low 100s. SBPs 110s-120s. Denies fever and chills. Bothered w/ constipation but otherwise tolerating meds ok. Has completely quit drinking and smoking (was drinking 1-2 beers a night prior to admit).     Review of Systems: [y] = yes, [ ]  = no   General: Weight gain [ ] ; Weight loss [ ] ; Anorexia [ ] ; Fatigue [ ] ; Fever [ ] ; Chills [ ] ; Weakness [ ]   Cardiac: Chest pain/pressure [ ] ; Resting SOB [ ] ;  Exertional SOB [ ] ; Orthopnea [ ] ; Pedal Edema [ ] ; Palpitations [ ] ; Syncope [ ] ; Presyncope [ ] ; Paroxysmal nocturnal dyspnea[ ]   Pulmonary: Cough [ ] ; Wheezing[ ] ; Hemoptysis[ ] ; Sputum [ ] ; Snoring [ ]   GI: Vomiting[ ] ; Dysphagia[ ] ; Melena[ ] ; Hematochezia [ ] ; Heartburn[ ] ; Abdominal pain [ ] ; Constipation [ ] ; Diarrhea [ ] ; BRBPR [ ]   GU: Hematuria[ ] ; Dysuria [ ] ; Nocturia[ ]   Vascular: Pain in legs with walking [ ] ; Pain in feet with lying flat [ ] ; Non-healing sores [ ] ; Stroke [ ] ; TIA [ ] ; Slurred speech [ ] ;  Neuro: Headaches[ ] ; Vertigo[ ] ; Seizures[ ] ; Paresthesias[ ] ;Blurred vision [ ] ; Diplopia [ ] ; Vision changes [ ]   Ortho/Skin: Arthritis [ ] ; Joint pain [ ] ; Muscle pain [ ] ; Joint swelling [ ] ; Back Pain [ ] ; Rash [ ]   Psych: Depression[ ] ; Anxiety[ ]   Heme: Bleeding problems [ ] ; Clotting disorders [ ] ; Anemia [ ]   Endocrine: Diabetes [ ] ; Thyroid dysfunction[ ]    Past Medical History:  Diagnosis Date  . Ankylosing spondylitis ([place])   . Arthritis   . BENIGN PROSTATIC HYPERTROPHY 06/07/2008  . COLONIC POLYPS, HX OF 06/07/2008  . H/O hiatal hernia   . Heart murmur   . HYPERLIPIDEMIA 06/07/2008  . HYPERTENSION 06/07/2008  . Myocardial infarction [facility]) 2005   NSTEMI, s/p LAD stent  . NEPHROLITHIASIS, HX OF 06/07/2008  . STEMI (ST elevation myocardial infarction) ([place]) 04/27/2019  Current Outpatient Medications  Medication Sig Dispense Refill  . aspirin 81 MG chewable tablet Chew 1 tablet (81 mg total) by mouth daily. 30 tablet 3  . atorvastatin (LIPITOR) 80 MG tablet TAKE 1 TABLET BY MOUTH ONCE DAILY AT 6PM. (Patient taking differently: Take 80 mg by mouth daily at 6 PM. ) 90 tablet 3  . citalopram (CELEXA) 10 MG tablet Take 1 tablet (10 mg total) by mouth daily. 30 tablet 3  . digoxin (LANOXIN) 0.125 MG tablet Take 1 tablet (0.125 mg total) by mouth daily. 30 tablet 3  . eplerenone (INSPRA) 50 MG tablet Take 12.4 mg by mouth daily.    . furosemide (LASIX) 20 MG  tablet Take 2 tablets (40 mg total) by mouth daily. 180 tablet 0  . inFLIXimab in sodium chloride 0.9 % Inject into the vein every 6 (six) weeks.    . midodrine (PROAMATINE) 5 MG tablet Take 1 tablet (5 mg total) by mouth 3 (three) times daily with meals. 90 tablet 1  . tamsulosin (FLOMAX) 0.4 MG CAPS capsule TAKE 1 CAPSULE(0.4 MG) BY MOUTH DAILY (Patient taking differently: Take 0.4 mg by mouth daily. ) 90 capsule 1  . ticagrelor (BRILINTA) 90 MG TABS tablet Take 1 tablet (90 mg total) by mouth 2 (two) times daily. 60 tablet 3   No current facility-administered medications for this encounter.    No Known Allergies    Social History   Socioeconomic History  . Marital status: Single    Spouse name: Not on file  . Number of children: Not on file  . Years of education: Not on file  . Highest education level: Not on file  Occupational History  . 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 Smoking status: Current Every Day Smoker    Packs/day: 0.50    Years: 40.00    Pack years: 20.00    Types: Cigarettes  . Smokeless tobacco: Never Used  Substance and Sexual Activity  . Alcohol use: Yes    Comment: daily  . Drug use: No  . Sexual activity: Not on file  Other Topics Concern  . Not on file  Social History Narrative  . Not on file   Social Determinants of Health   Financial Resource Strain:   . Difficulty of Paying Living Expenses: Not on file  Food Insecurity:   . Worried About Charity fundraiser in the Last Year: Not on file  . Ran Out of Food in the Last Year: Not on file  Transportation Needs:   . Lack of Transportation (Medical): Not on file  . Lack of Transportation (Non-Medical): Not on file  Physical Activity:   . Days of Exercise per Week: Not on file  . Minutes of Exercise per Session: Not on file  Stress:   . Feeling of Stress : Not on file  Social Connections:   . Frequency of Communication with Friends and Family: Not on file  .  Frequency of Social Gatherings with Friends and Family: Not on file  . Attends Religious Services: Not on file  . Active Member of Clubs or Organizations: Not on file  . Attends Archivist Meetings: Not on file  . Marital Status: Not on file  Intimate Partner Violence:   . Fear of Current or Ex-Partner: Not on file  . Emotionally Abused: Not on file  . Physically Abused: Not on file  . Sexually Abused: Not on file  Family History  Problem Relation Age of Onset  . Depression Father   . Arthritis Sister        RA  . Heart failure Mother   . Other Neg Hx   . Colon cancer Neg Hx   . Esophageal cancer Neg Hx   . Rectal cancer Neg Hx   . Stomach cancer Neg Hx     Vitals:   05/20/19 1538  BP: 130/80  Pulse: (!) 121  SpO2: 97%  Weight: 86.6 kg     PHYSICAL EXAM: General:  Well appearing. No respiratory difficulty HEENT: normal Neck: supple. no JVD. Carotids 2+ bilat; no bruits. No lymphadenopathy or thyromegaly appreciated. Cor: PMI nondisplaced. Regular rhythm, tachy rate. No rubs, gallops or murmurs. Lungs: clear Abdomen: soft, nontender, nondistended. No hepatosplenomegaly. No bruits or masses. Good bowel sounds. Extremities: no cyanosis, clubbing, rash, edema Neuro: alert & oriented x 3, cranial nerves grossly intact. moves all 4 extremities w/o difficulty. Affect pleasant.  ECG: sinus tach 110    ASSESSMENT & PLAN:  1. CAD: s/p anterior STEMI 12/20. LHC showed chronically occluded RCA (with L>>R collaterals) and thrombotic occlusion of proximal LAD. Underwent PCI of LAD. - no anginal symptoms - continue DAPT w/ ASA + Brilinta for a minimum of 12 months - continue high intensity statin. LDL goal < 70 (79 mg/dL 12/20). Repeat FLP and HFTs in 6 weeks.  - add low dose  blocker for tachycardia, Coreg 3.125 mg bid - refer to cardiac rehab   2.  Chronic systolic heart failure/ischemic cardiomyopathy: EF post anterior MI 30 to 35%. -Continue eplerenone  12.5 mg daily -Continue digoxin 0.125 mg daily.  Check digoxin level today -Add low-dose beta-blocker, Coreg 3.125 mg twice daily -We will continue to wean off midodrine, reduce dose down to 2.5 mg 3 times daily.  If BP is improved at next follow-up visit, plan to start low-dose ARB/Entresto -Wt has been steadily droping since discharge. Was 196 lb day of d/c. Weighed 189 lb at home this morning.  Mildly tachycardic.  No evidence of volume overload on exam.  Question if he may be a little dry.  We will stop daily Lasix for now.  He will monitor weight closely at home and will take as needed based on weight gain.  Take Lasix if greater than 3 pound weight gain in 24 hours or greater than 5 pound weight gain in 1 week.  -Check BMP today -We will plan gradual titration of heart failure medication regimen.  Once on optimal guideline directed medical therapy x3 months, we will repeat echocardiogram.  If EF remains less than 35%, will refer to EP for consideration for ICD.  3.  Tobacco abuse: Former smoker.  Recently quit after myocardial infarction.  Was congratulated on his efforts.  4.  Hyperlipidemia: Recent LDL 7 9 mg/dL.  Goal in the setting of known coronary disease and recent myocardial infarction is less than 70 mg/dL.  Reports full compliance with atorvastatin 80 mg and tolerating well without side effects. -Repeat fasting lipid panel hepatic function test in 6 weeks  F/u in 2 weeks for further titration of heart failure regimen.   Lyda Jester, PA-C 05/20/19

## 2019-05-20 NOTE — Patient Instructions (Signed)
Lab work done today. We will notify you of any abnormal lab work. No news is good news!  EKG done today.  START Carvedilol 3.125mg  tab two times daily.  DECREASE Midodrine to 2.5mg  tab three times daily.  Please follow up with the [facility] in 2-3 weeks.  At the [facility], you and your health needs are our priority. As part of our continuing mission to provide you with exceptional heart care, we have created designated Provider Care Teams. These Care Teams include your primary Cardiologist (physician) and Advanced Practice Providers (APPs- Physician Assistants and Nurse Practitioners) who all work together to provide you with the care you need, when you need it.   You may see any of the following providers on your designated Care Team at your next follow up: [name] Dr Glori Bickers . Dr Loralie Champagne . Darrick Grinder, NP . Lyda Jester, PA . Audry Riles, PharmD   Please be sure to bring in all your medications bottles to every appointment.

## 2019-05-21 ENCOUNTER — Telehealth (HOSPITAL_COMMUNITY): Payer: Self-pay

## 2019-05-21 MED ORDER — FUROSEMIDE 20 MG PO TABS: 40.0000 mg | ORAL_TABLET | ORAL | 0 refills | Status: DC | PRN

## 2019-05-21 MED ORDER — EPLERENONE 25 MG PO TABS: 25.0000 mg | ORAL_TABLET | Freq: Every day | ORAL | 3 refills | Status: DC

## 2019-05-21 MED ORDER — ATORVASTATIN CALCIUM 80 MG PO TABS: ORAL_TABLET | ORAL | 3 refills | Status: DC

## 2019-05-21 MED ORDER — CARVEDILOL 3.125 MG PO TABS: 3.1250 mg | ORAL_TABLET | Freq: Two times a day (BID) | ORAL | 3 refills | Status: DC

## 2019-05-21 MED ORDER — TICAGRELOR 90 MG PO TABS: 90.0000 mg | ORAL_TABLET | Freq: Two times a day (BID) | ORAL | 3 refills | Status: DC

## 2019-05-21 NOTE — Telephone Encounter (Signed)
Spoke with pt. Pt agreeable to med change. Sent in script. Pt also concerned that heart failure meds wont go to the right pharmacy so this RN sent in refills to his preferred pharmacy. Pt also had a question about if he should take furosemide as needed or not. He stated that [place] PA-C said he should take it as needed but he wasn't sure. Not on avs. Routed to PA-C

## 2019-05-21 NOTE — Telephone Encounter (Signed)
Called and reviewed med change with pt. Pt verbalized understanding. No further questions at this time.

## 2019-05-21 NOTE — Telephone Encounter (Signed)
-----   Message from Consuelo Pandy, [place] sent at 05/20/2019  8:14 PM EST ----- K low at 3.0. Increase eplerenone to 25 mg daily. Repeat BMP in 1 week.

## 2019-05-23 ENCOUNTER — Encounter (HOSPITAL_COMMUNITY): Payer: Self-pay

## 2019-05-24 ENCOUNTER — Telehealth (HOSPITAL_COMMUNITY): Payer: Self-pay | Admitting: Pharmacist

## 2019-05-24 ENCOUNTER — Telehealth (HOSPITAL_COMMUNITY): Payer: Self-pay | Admitting: *Deleted

## 2019-05-24 MED ORDER — DIGOXIN 125 MCG PO TABS: 0.1250 mg | ORAL_TABLET | Freq: Every day | ORAL | 3 refills | Status: DC

## 2019-05-24 MED ORDER — PRASUGREL HCL 10 MG PO TABS: 10.0000 mg | ORAL_TABLET | Freq: Every day | ORAL | 3 refills | Status: DC

## 2019-05-24 NOTE — Telephone Encounter (Signed)
Please see message below about assistance with Brilinta. I sent the refill of digoxin and told patient I would follow up with our clinic pharmacist about [place].  May 23, 2019 Jennefer Bravo to Patient Medical Advice Request Pool   PM  1:55 PM Is there any option to [place]? The price at [facility] is $ 348.86. Is wasn't that much when I got my meds when I left the hospital but maybe that's because this is new year with new deductible? If it is essential I will get Brilanta, but hoping there is a more affordable option. Also, I got my [facility]ills at [facility], but there was no refill for Digoxin. Can this get sent to them or do I not need to keep taking? Thanks   Routed to [employer]

## 2019-05-24 NOTE — Telephone Encounter (Signed)
Received message that patient was having difficulties affording Brilinta (~$450.00/month). He has no contraindications to Prasugrel and it would be affordable ($43.66 via Medicare, $15.99 through GoodRx). Spoke with Dr. [name] and he is ok with changing therapy to Prasugrel 10 mg daily. Sent new Rx for prasugrel to [facility]. He will stop Brilinta. Also sent in refill for digoxin.   Audry Riles, PharmD, BCPS, BCCP, CPP Heart Failure Clinic Pharmacist [phone]

## 2019-05-26 ENCOUNTER — Other Ambulatory Visit: Payer: Self-pay

## 2019-05-26 ENCOUNTER — Encounter (HOSPITAL_COMMUNITY)
Admission: RE | Admit: 2019-05-26 | Discharge: 2019-05-26 | Disposition: A | Discharge status: Home / Self Care | Payer: Self-pay | Source: Ambulatory Visit | Attending: Cardiology | Admitting: Cardiology

## 2019-05-26 DIAGNOSIS — Z955 Presence of coronary angioplasty implant and graft: Secondary | ICD-10-CM | POA: Insufficient documentation

## 2019-05-26 DIAGNOSIS — I2102 ST elevation (STEMI) myocardial infarction involving left anterior descending coronary artery: Secondary | ICD-10-CM | POA: Insufficient documentation

## 2019-05-26 NOTE — Progress Notes (Signed)
Cardiac Rehab Note:  Successful telephone encounter to [name] to confirm Cardiac Rehab orientation appointment for 05/27/19 at 11:30am. Nursing assessment completed. Instructions for appointment provided. Patient screening for Covid-19 negative. [name] was also consented to participate in the [employer], Virtual Cardiac Rehab App. He will be assisted with app usage during in-person orientation appointment.            Confirm Consent - In the setting of the current Covid19 crisis, you are scheduled for a phone visit with your Cardiac or Pulmonary team member.  Just as we do with many in-gym visits, in order for you to participate in this visit, we must obtain consent.  If you'd like, I can send this to your mychart (if signed up) or email for you to review.  Otherwise, I can obtain your verbal consent now.  By agreeing to a telephone visit, we'd like you to understand that the technology does not allow for your Cardiac or Pulmonary Rehab team member to perform a physical assessment, and thus may limit their ability to fully assess your ability to perform exercise programs. If your provider identifies any concerns that need to be evaluated in person, we will make arrangements to do so.  Finally, though the technology is pretty good, we cannot assure that it will always work on either your or our end and we cannot ensure that we have a secure connection.  Cardiac and Pulmonary Rehab Telehealth visits and "At Home" cardiac and pulmonary rehab are provided at no cost to you.        Are you willing to proceed?"        STAFF: Did the patient verbally acknowledge consent to telehealth visit? Document YES/NO here: Yes     Cathaleen Korol E. Laray Anger, BSN  Cardiac and Pulmonary Rehab Staff        Date 05/26/19 @ Time 1330

## 2019-05-27 ENCOUNTER — Encounter (HOSPITAL_COMMUNITY)
Admission: RE | Admit: 2019-05-27 | Discharge: 2019-05-27 | Disposition: A | Discharge status: Home / Self Care | Payer: Self-pay | Source: Ambulatory Visit | Attending: Cardiology | Admitting: Cardiology

## 2019-05-27 VITALS — BP 114/62 | HR 92 | Temp 97.3°F | Ht 72.75 in | Wt 195.5 lb

## 2019-05-27 DIAGNOSIS — Z955 Presence of coronary angioplasty implant and graft: Secondary | ICD-10-CM

## 2019-05-27 DIAGNOSIS — I2102 ST elevation (STEMI) myocardial infarction involving left anterior descending coronary artery: Secondary | ICD-10-CM

## 2019-05-27 NOTE — Progress Notes (Signed)
Spoke to pt regarding Virtual Cardiac  and Pulmonary Rehab.  Pt  was able to download the [employer] app on their smart device with no issues. Pt set up their account and received the following welcome message -"Welcome to the [facility] program. We hope that you will find the exercise program beneficial in your recovery process. Our staff is available to assist with any questions/concerns about your exercise routine. Best wishes". Brief orientation provided to with the advisement to watch the "Intro to Rehab" series located under the Resource tab. Pt verbalized understanding. Will continue to follow and monitor pt progress with feedback as needed.  

## 2019-05-27 NOTE — Progress Notes (Signed)
Cardiac Rehab Note:  QUALITY OF LIFE SCORE REVIEW Pt completed Quality of Life survey as a participant in Cardiac Rehab.  Scores 21.0 or below are considered low.  Pt score very low in several areas Overall 13.19, Health and Function 10.1, socioeconomic 21.86, physiological and spiritual 10.8, family 17.5. Patient quality of life slightly altered by physical constraints which limits ability to perform as prior to recent cardiac illness. He lives alone and admits to being extremely fearful of sudden cardiac death or that his heart function will not improve. He expressed his desire to have an ICD placed as discussed with his cardiologist, however they feel his EF may improve with exercise over the next 2 months. He understands the rational for not having an ICD placed at this time but feels it would elevate his anxieties about sudden death. He is offered emotional support, reassurance, and referral to spiritual care. He declines referral but appreciative of the support. He has a sister in the area but no other local support. Will continue to monitor and intervene as necessary.   Janean Eischen E. Laray Anger, BSN

## 2019-05-28 ENCOUNTER — Encounter (HOSPITAL_COMMUNITY)
Admission: RE | Admit: 2019-05-28 | Discharge: 2019-05-28 | Disposition: A | Discharge status: Home / Self Care | Payer: Self-pay | Source: Ambulatory Visit | Attending: Cardiology | Admitting: Cardiology

## 2019-05-28 ENCOUNTER — Other Ambulatory Visit: Payer: Self-pay

## 2019-05-28 ENCOUNTER — Telehealth (HOSPITAL_COMMUNITY): Payer: Self-pay | Admitting: *Deleted

## 2019-05-28 NOTE — Telephone Encounter (Signed)
Spoke with Brandon [name] he says he is interested in getting the COVID 19 vaccine . Trase will check with his Rheumatologist about proceeding with getting the vaccine.Barnet Pall, RN,BSN 05/28/2019 11:40 AM

## 2019-05-28 NOTE — Progress Notes (Signed)
Cardiac Individual Treatment Plan  Patient Details  Name: Brandon [name] MRN: [id number] Date of Birth: 1955-07-21 Referring Provider:     CARDIAC REHAB PHASE II ORIENTATION from 05/27/2019 in [facility]  Referring Provider  [name], MD      Initial Encounter Date:    CARDIAC REHAB PHASE II ORIENTATION from 05/27/2019 in [facility]  Date  05/27/19      Visit Diagnosis: ST elevation myocardial infarction involving left anterior descending (LAD) coronary artery ([place]) 04/27/19  S/P coronary artery stent placement S/P DES LAD 04/27/19  Patient's Home Medications on Admission:  Current Outpatient Medications:  .  aspirin 81 MG chewable tablet, Chew 1 tablet (81 mg total) by mouth daily., Disp: 30 tablet, Rfl: 3 .  atorvastatin (LIPITOR) 80 MG tablet, TAKE 1 TABLET BY MOUTH ONCE DAILY AT 6PM., Disp: 90 tablet, Rfl: 3 .  carvedilol (COREG) 3.125 MG tablet, Take 1 tablet (3.125 mg total) by mouth 2 (two) times daily., Disp: 180 tablet, Rfl: 3 .  citalopram (CELEXA) 10 MG tablet, Take 1 tablet (10 mg total) by mouth daily., Disp: 30 tablet, Rfl: 3 .  digoxin (LANOXIN) 0.125 MG tablet, Take 1 tablet (0.125 mg total) by mouth daily., Disp: 30 tablet, Rfl: 3 .  eplerenone (INSPRA) 25 MG tablet, Take 1 tablet (25 mg total) by mouth daily., Disp: 90 tablet, Rfl: 3 .  furosemide (LASIX) 20 MG tablet, Take 2 tablets (40 mg total) by mouth as needed., Disp: 180 tablet, Rfl: 0 .  inFLIXimab in sodium chloride 0.9 %, Inject into the vein every 6 (six) weeks., Disp: , Rfl:  .  midodrine (PROAMATINE) 2.5 MG tablet, Take 1 tablet (2.5 mg total) by mouth 3 (three) times daily with meals., Disp: 90 tablet, Rfl: 3 .  prasugrel (EFFIENT) 10 MG TABS tablet, Take 1 tablet (10 mg total) by mouth daily., Disp: 90 tablet, Rfl: 3 .  tamsulosin (FLOMAX) 0.4 MG CAPS capsule, TAKE 1 CAPSULE(0.4 MG) BY MOUTH DAILY (Patient taking differently: Take 0.4  mg by mouth daily. ), Disp: 90 capsule, Rfl: 1  Past Medical History: Past Medical History:  Diagnosis Date  . Ankylosing spondylitis ([place])   . Arthritis   . BENIGN PROSTATIC HYPERTROPHY 06/07/2008  . COLONIC POLYPS, HX OF 06/07/2008  . H/O hiatal hernia   . Heart murmur   . HYPERLIPIDEMIA 06/07/2008  . HYPERTENSION 06/07/2008  . Myocardial infarction [facility]) 2005   NSTEMI, s/p LAD stent  . NEPHROLITHIASIS, HX OF 06/07/2008  . STEMI (ST elevation myocardial infarction) ([place]) 04/27/2019    Tobacco Use: Social History   Tobacco Use  Smoking Status Current Every Day Smoker  . Packs/day: 0.50  . Years: 40.00  . Pack years: 20.00  . Types: Cigarettes  Smokeless Tobacco Never Used    Labs: Recent Review Flowsheet Data    Labs for ITP Cardiac and Pulmonary Rehab Latest Ref Rng & Units 04/30/2019 05/01/2019 05/02/2019 05/03/2019 05/04/2019   Cholestrol 0 - 200 mg/dL - - - - -   LDLCALC 0 - 99 mg/dL - - - - -   HDL >40 mg/dL - - - - -   Trlycerides <150 mg/dL - - - - -   Hemoglobin A1c 4.8 - 5.6 % - - - - -   HCO3 20.0 - 28.0 mmol/L - - - - -   TCO2 22 - 32 mmol/L - - - - -   ACIDBASEDEF 0.0 - 2.0 mmol/L - - - - -  O2SAT % 60.9 68.7 74.2 75.2 69.3      Capillary Blood Glucose: No results found for: GLUCAP   Exercise Target Goals: Exercise Program Goal: Individual exercise prescription set using results from initial 6 min walk test and THRR while considering  patient's activity barriers and safety.   Exercise Prescription Goal: Starting with aerobic activity 30 plus minutes a day, 3 days per week for initial exercise prescription. Provide home exercise prescription and guidelines that participant acknowledges understanding prior to discharge.  Activity Barriers & Risk Stratification: Activity Barriers & Cardiac Risk Stratification - 05/27/19 1230      Activity Barriers & Cardiac Risk Stratification   Activity Barriers  Shortness of Breath;Other (comment)    Comments   Ankylosing spondylitis (back, neck, hips).    Cardiac Risk Stratification  High       6 Minute Walk: 6 Minute Walk    Row Name 05/27/19 1248         6 Minute Walk   Phase  Initial     Distance  1104 feet     Walk Time  6 minutes     # of Rest Breaks  0     MPH  2.09     METS  2.92     RPE  15     Perceived Dyspnea   1     Symptoms  Yes (comment)     Comments  Patient c/o bilateral hip pain "6/10" on the pain scale, which is chronic for him. Pt c/o mild shortness of breath.     Resting HR  92 bpm     Resting BP  114/62     Resting Oxygen Saturation   98 %     Exercise Oxygen Saturation  during 6 min walk  94 %     Max Ex. HR  114 bpm     Max Ex. BP  102/72     2 Minute Post BP  100/64        Oxygen Initial Assessment:   Oxygen Re-Evaluation:   Oxygen Discharge (Final Oxygen Re-Evaluation):   Initial Exercise Prescription: Initial Exercise Prescription - 05/28/19 0900      Date of Initial Exercise RX and Referring Provider   Date  05/27/19    Referring Provider  [name], MD      Treadmill   Minutes  10      Track   Minutes  10      Prescription Details   Frequency (times per week)  5-7    Duration  Progress to 30 minutes of continuous aerobic without signs/symptoms of physical distress      Intensity   THRR 40-80% of Max Heartrate  62-124    Ratings of Perceived Exertion  11-13    Perceived Dyspnea  0-4      Progression   Progression  Continue to progress workloads to maintain intensity without signs/symptoms of physical distress.      Resistance Training   Training Prescription  Yes    Reps  10-15       Perform Capillary Blood Glucose checks as needed.  Exercise Prescription Changes:   Exercise Comments:  Exercise Comments    Row Name 05/27/19 1234           Exercise Comments  Reviewed home exercise plan with patient.          Exercise Goals and Review:  Exercise Goals    Row Name 05/27/19 1234  Exercise Goals   Increase Physical Activity  Yes       Intervention  Develop an individualized exercise prescription for aerobic and resistive training based on initial evaluation findings, risk stratification, comorbidities and participant's personal goals.;Provide advice, education, support and counseling about physical activity/exercise needs.       Expected Outcomes  Short Term: Attend rehab on a regular basis to increase amount of physical activity.;Long Term: Exercising regularly at least 3-5 days a week.;Long Term: Add in home exercise to make exercise part of routine and to increase amount of physical activity.       Increase Strength and Stamina  Yes       Intervention  Provide advice, education, support and counseling about physical activity/exercise needs.;Develop an individualized exercise prescription for aerobic and resistive training based on initial evaluation findings, risk stratification, comorbidities and participant's personal goals.       Expected Outcomes  Short Term: Increase workloads from initial exercise prescription for resistance, speed, and METs.;Short Term: Perform resistance training exercises routinely during rehab and add in resistance training at home;Long Term: Improve cardiorespiratory fitness, muscular endurance and strength as measured by increased METs and functional capacity (6MWT)       Able to understand and use rate of perceived exertion (RPE) scale  Yes       Intervention  Provide education and explanation on how to use RPE scale       Expected Outcomes  Short Term: Able to use RPE daily in rehab to express subjective intensity level;Long Term:  Able to use RPE to guide intensity level when exercising independently       Knowledge and understanding of Target Heart Rate Range (THRR)  Yes       Intervention  Provide education and explanation of THRR including how the numbers were predicted and where they are located for reference       Expected Outcomes  Short  Term: Able to state/look up THRR;Long Term: Able to use THRR to govern intensity when exercising independently;Short Term: Able to use daily as guideline for intensity in rehab       Able to check pulse independently  Yes       Intervention  Review the importance of being able to check your own pulse for safety during independent exercise;Provide education and demonstration on how to check pulse in carotid and radial arteries.       Expected Outcomes  Short Term: Able to explain why pulse checking is important during independent exercise;Long Term: Able to check pulse independently and accurately       Understanding of Exercise Prescription  Yes       Intervention  Provide education, explanation, and written materials on patient's individual exercise prescription       Expected Outcomes  Short Term: Able to explain program exercise prescription;Long Term: Able to explain home exercise prescription to exercise independently          Exercise Goals Re-Evaluation :    Discharge Exercise Prescription (Final Exercise Prescription Changes):   Nutrition:  Target Goals: Understanding of nutrition guidelines, daily intake of sodium 1500mg , cholesterol 200mg , calories 30% from fat and 7% or less from saturated fats, daily to have 5 or more servings of fruits and vegetables.  Biometrics: Pre Biometrics - 05/27/19 1225      Pre Biometrics   Height  6' 0.75" (1.848 m)    Weight  88.7 kg    Waist Circumference  42 inches    Hip  Circumference  39.25 inches    Waist to Hip Ratio  1.07 %    BMI (Calculated)  25.97    Triceps Skinfold  5 mm    % Body Fat  23 %    Grip Strength  35 kg    Single Leg Stand  2.68 seconds        Nutrition Therapy Plan and Nutrition Goals:   Nutrition Assessments: Nutrition Assessments - 05/27/19 1408      MEDFICTS Scores   Pre Score  49       Nutrition Goals Re-Evaluation:   Nutrition Goals Discharge (Final Nutrition Goals  Re-Evaluation):   Psychosocial: Target Goals: Acknowledge presence or absence of significant depression and/or stress, maximize coping skills, provide positive support system. Participant is able to verbalize types and ability to use techniques and skills needed for reducing stress and depression.  Initial Review & Psychosocial Screening: Initial Psych Review & Screening - 05/27/19 1208      Initial Review   Current issues with  None Identified      Family Dynamics   Good Support System?  Yes   Benson lives alone but has a sister who lives near by for support     Barriers   Psychosocial barriers to participate in program  The patient should benefit from training in stress management and relaxation.      Screening Interventions   Interventions  Encouraged to exercise    Expected Outcomes  Long Term Goal: Stressors or current issues are controlled or eliminated.       Quality of Life Scores: Quality of Life - 05/28/19 1042      Quality of Life   Select  Quality of Life      Quality of Life Scores   Health/Function Pre  10.1 %    Socioeconomic Pre  21.86 %    Psych/Spiritual Pre  10.08 %    Family Pre  17.5 %    GLOBAL Pre  13.19 %      Scores of 19 and below usually indicate a poorer quality of life in these areas.  A difference of  2-3 points is a clinically meaningful difference.  A difference of 2-3 points in the total score of the Quality of Life Index has been associated with significant improvement in overall quality of life, self-image, physical symptoms, and general health in studies assessing change in quality of life.  PHQ-9: Recent Review Flowsheet Data    Depression screen [facility] 2/9 04/15/2019 12/08/2014 03/29/2013   Decreased Interest 0 0 0   Down, Depressed, Hopeless 0 0 0   PHQ - 2 Score 0 0 0     Interpretation of Total Score  Total Score Depression Severity:  1-4 = Minimal depression, 5-9 = Mild depression, 10-14 = Moderate depression, 15-19 = Moderately  severe depression, 20-27 = Severe depression   Psychosocial Evaluation and Intervention:   Psychosocial Re-Evaluation:   Psychosocial Discharge (Final Psychosocial Re-Evaluation):   Vocational Rehabilitation: Provide vocational rehab assistance to qualifying candidates.   Vocational Rehab Evaluation & Intervention: Vocational Rehab - 05/28/19 1104      Initial Vocational Rehab Evaluation & Intervention   Assessment shows need for Vocational Rehabilitation  No       Education: Education Goals: Education classes will be provided on a weekly basis, covering required topics. Participant will state understanding/return demonstration of topics presented.  Learning Barriers/Preferences: Learning Barriers/Preferences - 05/28/19 1103      Learning Barriers/Preferences   Learning Barriers  None    Learning Preferences  Pictoral;Written Material       Education Topics: Hypertension, Hypertension Reduction -Define heart disease and high blood pressure. Discus how high blood pressure affects the body and ways to reduce high blood pressure.   Exercise and Your Heart -Discuss why it is important to exercise, the FITT principles of exercise, normal and abnormal responses to exercise, and how to exercise safely.   Angina -Discuss definition of angina, causes of angina, treatment of angina, and how to decrease risk of having angina.   Cardiac Medications -Review what the following cardiac medications are used for, how they affect the body, and side effects that may occur when taking the medications.  Medications include Aspirin, Beta blockers, calcium channel blockers, ACE Inhibitors, angiotensin receptor blockers, diuretics, digoxin, and antihyperlipidemics.   Congestive Heart Failure -Discuss the definition of CHF, how to live with CHF, the signs and symptoms of CHF, and how keep track of weight and sodium intake.   Heart Disease and Intimacy -Discus the effect sexual activity  has on the heart, how changes occur during intimacy as we age, and safety during sexual activity.   Smoking Cessation / COPD -Discuss different methods to quit smoking, the health benefits of quitting smoking, and the definition of COPD.   Nutrition I: Fats -Discuss the types of cholesterol, what cholesterol does to the heart, and how cholesterol levels can be controlled.   Nutrition II: Labels -Discuss the different components of food labels and how to read food label   Heart Parts/Heart Disease and PAD -Discuss the anatomy of the heart, the pathway of blood circulation through the heart, and these are affected by heart disease.   Stress I: Signs and Symptoms -Discuss the causes of stress, how stress may lead to anxiety and depression, and ways to limit stress.   Stress II: Relaxation -Discuss different types of relaxation techniques to limit stress.   Warning Signs of Stroke / TIA -Discuss definition of a stroke, what the signs and symptoms are of a stroke, and how to identify when someone is having stroke.   Knowledge Questionnaire Score: Knowledge Questionnaire Score - 05/28/19 1043      Knowledge Questionnaire Score   Pre Score  25/28       Core Components/Risk Factors/Patient Goals at Admission: Personal Goals and Risk Factors at Admission - 05/28/19 1104      Core Components/Risk Factors/Patient Goals on Admission    Weight Management  Weight Maintenance;Yes    Intervention  Weight Management: Develop a combined nutrition and exercise program designed to reach desired caloric intake, while maintaining appropriate intake of nutrient and fiber, sodium and fats, and appropriate energy expenditure required for the weight goal.;Weight Management: Provide education and appropriate resources to help participant work on and attain dietary goals.;Weight Management/Obesity: Establish reasonable short term and long term weight goals.    Tobacco Cessation  Yes   Tomasz quit on  04/27/19 and remains tobacco free   Intervention  Advice worker, assist with locating and accessing local/national Quit Smoking programs, and support quit date choice.    Expected Outcomes  Long Term: Complete abstinence from all tobacco products for at least 12 months from quit date.    Heart Failure  Yes    Intervention  Provide a combined exercise and nutrition program that is supplemented with education, support and counseling about heart failure. Directed toward relieving symptoms such as shortness of breath, decreased exercise tolerance, and extremity edema.    Expected  Outcomes  Improve functional capacity of life;Short term: Daily weights obtained and reported for increase. Utilizing diuretic protocols set by physician.;Short term: Attendance in program 2-3 days a week with increased exercise capacity. Reported lower sodium intake. Reported increased fruit and vegetable intake. Reports medication compliance.;Long term: Adoption of self-care skills and reduction of barriers for early signs and symptoms recognition and intervention leading to self-care maintenance.    Hypertension  Yes    Intervention  Provide education on lifestyle modifcations including regular physical activity/exercise, weight management, moderate sodium restriction and increased consumption of fresh fruit, vegetables, and low fat dairy, alcohol moderation, and smoking cessation.;Monitor prescription use compliance.    Expected Outcomes  Short Term: Continued assessment and intervention until BP is < 140/29mm HG in hypertensive participants. < 130/7mm HG in hypertensive participants with diabetes, heart failure or chronic kidney disease.;Long Term: Maintenance of blood pressure at goal levels.    Lipids  Yes    Intervention  Provide education and support for participant on nutrition & aerobic/resistive exercise along with prescribed medications to achieve LDL 70mg , HDL >40mg .    Expected Outcomes  Short Term:  Participant states understanding of desired cholesterol values and is compliant with medications prescribed. Participant is following exercise prescription and nutrition guidelines.;Long Term: Cholesterol controlled with medications as prescribed, with individualized exercise RX and with personalized nutrition plan. Value goals: LDL < 70mg , HDL > 40 mg.    Stress  Yes    Intervention  Offer individual and/or small group education and counseling on adjustment to heart disease, stress management and health-related lifestyle change. Teach and support self-help strategies.;Refer participants experiencing significant psychosocial distress to appropriate mental health specialists for further evaluation and treatment. When possible, include family members and significant others in education/counseling sessions.    Expected Outcomes  Short Term: Participant demonstrates changes in health-related behavior, relaxation and other stress management skills, ability to obtain effective social support, and compliance with psychotropic medications if prescribed.;Long Term: Emotional wellbeing is indicated by absence of clinically significant psychosocial distress or social isolation.       Core Components/Risk Factors/Patient Goals Review:    Core Components/Risk Factors/Patient Goals at Discharge (Final Review):    ITP Comments: ITP Comments    Row Name 05/27/19 1207           ITP Comments  Dr Fransico Him MD, Medical Director          Comments: Collier Salina attended orientation on 05/28/2019 to review rules and guidelines for program.  Completed 6 minute walk test, Intitial ITP, and exercise prescription.  VSS. Telemetry-Sinus rhythm with an inverted t wave.  Raydel reported experiencing chronic bilateral hip pain 6/10 and mild shortness of breath this resolved after rest.. Safety measures and social distancing in place per CDC guidelines.Acen was able to download the virtual cardiac rehab better [facility] APP without  difficulty. Gerard set up an appointment to meet with [name] RD next week. Raymar reports that he is finishing up his Brillinta twice a day and is switching to Effient.Barnet Pall, RN,BSN 05/28/2019 11:48 AM

## 2019-05-28 NOTE — Progress Notes (Signed)
Reviewed home exercise guidelines with patient including endpoints, temperature precautions, target heart rate and rate of perceived exertion. Pt plans to walk as his mode of home exercise, and patient is planning to get a treadmill for home use. Pt has chronic back, neck and hip pain, and is unable to walk for long durations, although he feels like he will do better using the treadmill because he can hold on and take some pressure off. In the meantime, patient will walk 10 minutes, 3 times/day as tolerated, taking rest breaks as needed. Pt voices understanding of instructions given.   Sol Passer, MS, ACSM CEP

## 2019-05-31 ENCOUNTER — Encounter (HOSPITAL_COMMUNITY)
Admission: RE | Admit: 2019-05-31 | Discharge: 2019-05-31 | Disposition: A | Discharge status: Home / Self Care | Payer: Self-pay | Source: Ambulatory Visit | Attending: Cardiology | Admitting: Cardiology

## 2019-05-31 ENCOUNTER — Other Ambulatory Visit: Payer: Self-pay

## 2019-05-31 NOTE — Progress Notes (Signed)
Virtual Cardiac Rehab Note:  Successful telephone encounter to [name] to follow up on symptoms of shortness of breath with exercise that was logged in the [facility] virtual cardiac rehab app this weekend. Brandon [name] states his shortness of breath was not out of his norm. It is noted that he is taking brillinta and will switch to prasugrel on Thursday which may help with his shortness of breath at rest. He denies any additional intake of sodium. Wt stable. Vitals stable. BP 107-114/60-70s. HR 70-90 with appropriate exercise response. He states that his daily walking is becoming easier and feels he is already on his way to strengthening his heart. He is encouraged to notify cardiologist if SOB worsens. Brandon [name] is appreciative of follow up phone call.  Plan: Will follow up with patient in 1 week and PRN.  Brandon [name], BSN

## 2019-06-03 ENCOUNTER — Other Ambulatory Visit: Payer: Self-pay

## 2019-06-03 ENCOUNTER — Inpatient Hospital Stay (HOSPITAL_COMMUNITY)
Admission: RE | Admit: 2019-06-03 | Discharge: 2019-06-03 | Disposition: A | Discharge status: Home / Self Care | Payer: Medicare Other | Source: Ambulatory Visit

## 2019-06-03 NOTE — Progress Notes (Signed)
[place] [name] 64 y.o. male Nutrition Note: VIRTUAL CARDIAC REHAB    Visit Diagnosis: ST elevation myocardial infarction involving left anterior descending (LAD) coronary artery ([place]) 04/27/19  S/P coronary artery stent placement S/P DES LAD 04/27/19  Past Medical History:  Diagnosis Date  . Ankylosing spondylitis ([place])   . Arthritis   . BENIGN PROSTATIC HYPERTROPHY 06/07/2008  . COLONIC POLYPS, HX OF 06/07/2008  . H/O hiatal hernia   . Heart murmur   . HYPERLIPIDEMIA 06/07/2008  . HYPERTENSION 06/07/2008  . Myocardial infarction [facility]) 2005   NSTEMI, s/p LAD stent  . NEPHROLITHIASIS, HX OF 06/07/2008  . STEMI (ST elevation myocardial infarction) ([place]) 04/27/2019     Medications reviewed.   Current Outpatient Medications:  .  aspirin 81 MG chewable tablet, Chew 1 tablet (81 mg total) by mouth daily., Disp: 30 tablet, Rfl: 3 .  atorvastatin (LIPITOR) 80 MG tablet, TAKE 1 TABLET BY MOUTH ONCE DAILY AT 6PM., Disp: 90 tablet, Rfl: 3 .  carvedilol (COREG) 3.125 MG tablet, Take 1 tablet (3.125 mg total) by mouth 2 (two) times daily., Disp: 180 tablet, Rfl: 3 .  citalopram (CELEXA) 10 MG tablet, Take 1 tablet (10 mg total) by mouth daily., Disp: 30 tablet, Rfl: 3 .  digoxin (LANOXIN) 0.125 MG tablet, Take 1 tablet (0.125 mg total) by mouth daily., Disp: 30 tablet, Rfl: 3 .  eplerenone (INSPRA) 25 MG tablet, Take 1 tablet (25 mg total) by mouth daily., Disp: 90 tablet, Rfl: 3 .  furosemide (LASIX) 20 MG tablet, Take 2 tablets (40 mg total) by mouth as needed., Disp: 180 tablet, Rfl: 0 .  inFLIXimab in sodium chloride 0.9 %, Inject into the vein every 6 (six) weeks., Disp: , Rfl:  .  midodrine (PROAMATINE) 2.5 MG tablet, Take 1 tablet (2.5 mg total) by mouth 3 (three) times daily with meals., Disp: 90 tablet, Rfl: 3 .  prasugrel (EFFIENT) 10 MG TABS tablet, Take 1 tablet (10 mg total) by mouth daily., Disp: 90 tablet, Rfl: 3 .  tamsulosin (FLOMAX) 0.4 MG CAPS capsule, TAKE 1 CAPSULE(0.4 MG)  BY MOUTH DAILY (Patient taking differently: Take 0.4 mg by mouth daily. ), Disp: 90 capsule, Rfl: 1   Ht Readings from Last 1 Encounters:  05/27/19 6' 0.75" (1.848 m)     Wt Readings from Last 3 Encounters:  05/27/19 195 lb 8.8 oz (88.7 kg)  05/20/19 191 lb (86.6 kg)  05/04/19 196 lb 13.9 oz (89.3 kg)     There is no height or weight on file to calculate BMI.   Social History   Tobacco Use  Smoking Status Current Every Day Smoker  . Packs/day: 0.50  . Years: 40.00  . Pack years: 20.00  . Types: Cigarettes  Smokeless Tobacco Never Used     Lab Results  Component Value Date   CHOL 171 04/28/2019   Lab Results  Component Value Date   HDL 63 04/28/2019   Lab Results  Component Value Date   LDLCALC 93 04/28/2019   Lab Results  Component Value Date   TRIG 73 04/28/2019   Lab Results  Component Value Date   CHOLHDL 2.7 04/28/2019     Lab Results  Component Value Date   HGBA1C 6.0 (H) 04/27/2019     CBG (last 3)  No results for input(s): GLUCAP in the last 72 hours.   Nutrition Note  Spoke with pt. Nutrition Plan and Nutrition Survey goals reviewed with pt. [place] has been making diet changes to  lower sodium intake since leaving hospital. He has not been measuring fluid intake but thinks he is supposed to be on 2 liter fluid restriction. Discussed what 2 liters would be and asked him to keep a fluid log.  He is interested and motivated to continue making diet changes.  Education: A1C and what that number means, heart failure and sodium intake, label reading  Will f/u with education on carbohydrates, fiber, and eating for heart disease.   Pt has Pre-diabetes. Will continue discussion at f/u.   Pt with dx of CHF. Per discussion, pt does use canned/convenience foods often. Pt does not add salt to food. Pt does not eat out frequently. He cooks for himself and enjoys it.   He continues to abstain from alcohol and tobacco.  Pt expressed understanding of the  information reviewed.   Nutrition Diagnosis ? Food-and nutrition-related knowledge deficit related to lack of exposure to information as related to diagnosis of: ? CVD ? Pre-diabetes  Nutrition Intervention ? Pt's individual nutrition plan reviewed with pt. ? Benefits of adopting Heart Healthy diet discussed when Medficts reviewed.   ? Continue client-centered nutrition education by RD, as part of interdisciplinary care.  Goal(s) ? Pt to build a healthy plate including vegetables, fruits, whole grains, and low-fat dairy products in a heart healthy meal plan. ? Pt able to name foods that affect blood glucose  Plan:   Will provide client-centered nutrition education as part of interdisciplinary care  Monitor and evaluate progress toward nutrition goal with team.   [name], MS, RDN, LDN

## 2019-06-04 ENCOUNTER — Other Ambulatory Visit: Payer: Self-pay

## 2019-06-04 ENCOUNTER — Encounter (HOSPITAL_COMMUNITY)
Admission: RE | Admit: 2019-06-04 | Discharge: 2019-06-04 | Disposition: A | Discharge status: Home / Self Care | Payer: Self-pay | Source: Ambulatory Visit | Attending: Cardiology | Admitting: Cardiology

## 2019-06-04 DIAGNOSIS — I2102 ST elevation (STEMI) myocardial infarction involving left anterior descending coronary artery: Secondary | ICD-10-CM | POA: Insufficient documentation

## 2019-06-04 DIAGNOSIS — Z955 Presence of coronary angioplasty implant and graft: Secondary | ICD-10-CM | POA: Insufficient documentation

## 2019-06-04 NOTE — Progress Notes (Signed)
Virtual Cardiac Rehab Note:  Successful telephone encounter to [name] per his request. Mr Misener wishes to discuss his orthopnea and recent need for lasix prn. Mr. Siefert states he was extremely short of breath two nights ago which caused him to be restless. He was unsure if his orthopnea was caused by Brillinta or CHF. He decided to take 40 mg of PRN lasix as prescribed. States he slept much better last night with no shortness of breath.  Verbal education provided to patient regarding CHF action plan. He is strongly encouraged to weigh daily (which he has been doing), assess for lower extremity, increase in abdominal girth, and swelling of hands. He is also encouraged to assess for slight changes in breathlessness upon reclining/laying down. He is instructed to contact his cardiologist with 3 lb weight gain over night or 5 lb in a week. He is encouraged to utilize his PRN lasix with weight gain as above, increase in edema, abdominal girth, or orthopnea. He is also counseled about limiting his oral intake to 2 liters and his sodium to 1500 mg. Patient states his is following his sodium and fluid restrictions without deviation.   Mr. Sanroman continues to exercise daily and follow his cardiac rehab and daily exercise plan of care.  Plan: Will follow up within 1 week  Berenis Corter E. Rollene Rotunda RN, BSN [place]. [facility] Cardiac and Pulmonary Rehab [place] Direct: [phone]

## 2019-06-07 ENCOUNTER — Encounter (HOSPITAL_COMMUNITY)
Admission: RE | Admit: 2019-06-07 | Discharge: 2019-06-07 | Disposition: A | Discharge status: Home / Self Care | Payer: Self-pay | Source: Ambulatory Visit | Attending: Cardiology | Admitting: Cardiology

## 2019-06-07 ENCOUNTER — Encounter: Payer: Medicare Other | Admitting: Gastroenterology

## 2019-06-07 ENCOUNTER — Telehealth (HOSPITAL_COMMUNITY): Payer: Self-pay | Admitting: *Deleted

## 2019-06-07 ENCOUNTER — Other Ambulatory Visit: Payer: Self-pay

## 2019-06-07 NOTE — Telephone Encounter (Signed)
Patient called in to ask questions about exercising on his new to him treadmill.  [place] is participating in virtual cardiac rehab with the [employer] app.  He had been walking 10 minutes 3 times per day without difficulty.  He walked on the treadmill @ a speed of 1.5 mph and a 10% incline and experienced dizziness and light headedness and his heart rate was 125.  He stopped because he was aware this was an endpoint for exercise.  I commended him on his decision to stop. He logged the dizziness and light headedness on the app, when he stopped the symptoms resolved.  Going forward, after questioning him, he states that the 10% incline is permanent on his treadmill and he does not have the capability to change it.  Instructed to not exercise on the treadmill at this time, continue walking, but change to 15 minutes 2 times per day for a few days until it is easy, then change to 30 minutes 1 time per day.  When this becomes easy he may attempt the treadmill at 1.2 mph and see how he tolerates this.  Also, to check his heart rate with his pulse oximeter not to exceed heart rate of 125.  I discussed above information with Olinty, exercise physiologist in [place], she was in agreement. PLAN: Continue to monitor and follow on the app, address symptoms and concerns as needed.

## 2019-06-09 NOTE — Progress Notes (Signed)
Advanced Heart Failure Clinic Note   Referring Physician: PCP: [name], MD PCP-Cardiologist: [name], MD  [facility]: Dr. [name]   HPI: 64 y/o smoker (originally from [place], [place]) with h/o CAD s/p previous MI, HTN, HL admitted 04/27/19 with several days of stuttering CP. Initial ECG showed anterior ST elevation. HsTrop 476 -> >27k. Taken to cath lab which showed chronically occluded RCA (L>>R collaterals) and thrombotic occlusion of proximal LAD. Underwent PCI of LAD. Echo w/ reduced LVEF 30-35% w/ apical aneurysm. RV ok. Post cath, he required milrinone for cardiogenic shock. Diuresed w/ IV Lasix. Milrinone ultimately weaned off and Co-ox remained stable off milrinone at 69%. GDMT limited by hypotension. He required midodrine 5 mg tid. Was continued on eplerenone 12.5 mg daily (on prior to admit), digoxin added. Discharged home on lasix 20 mg daily. No BP room for ARB nor  blocker. For his CAD, he was placed on DAPT w/ ASA and Brilinta + high intensity statin, Lipitor 40 mg. He was discharged home on 05/04/19.  Today he returns for HF follow up. Last visit midodrine was cut back to 2.5 mg three times a day and coreg 3.125 mg twice a day was started. SBP  Running low 110s. Heart rate has been 70-80s. Overall feeling fine. Completed cardiac rehab.  Denies SOB/PND/Orthopnea. No chest pain.  Walking 15-20 minutes a day. Appetite ok. No fever or chills. Weight at home  193-194  pounds. Taking all medications. Only taking lasix once every 1-2 weeks. No longer drinking alcohol or cigarettes.   ECHO 04/27/19 EF 30-35% RV norma.  Moderate HK LV. Grade IDD.   Past Medical History:  Diagnosis Date  . Ankylosing spondylitis ([place])   . Arthritis   . BENIGN PROSTATIC HYPERTROPHY 06/07/2008  . COLONIC POLYPS, HX OF 06/07/2008  . H/O hiatal hernia   . Heart murmur   . HYPERLIPIDEMIA 06/07/2008  . HYPERTENSION 06/07/2008  . Myocardial infarction [facility]) 2005   NSTEMI, s/p LAD stent  .  NEPHROLITHIASIS, HX OF 06/07/2008  . STEMI (ST elevation myocardial infarction) ([place]) 04/27/2019    Current Outpatient Medications  Medication Sig Dispense Refill  . aspirin 81 MG chewable tablet Chew 1 tablet (81 mg total) by mouth daily. 30 tablet 3  . atorvastatin (LIPITOR) 80 MG tablet TAKE 1 TABLET BY MOUTH ONCE DAILY AT 6PM. 90 tablet 3  . carvedilol (COREG) 3.125 MG tablet Take 1 tablet (3.125 mg total) by mouth 2 (two) times daily. 180 tablet 3  . digoxin (LANOXIN) 0.125 MG tablet Take 1 tablet (0.125 mg total) by mouth daily. 30 tablet 3  . eplerenone (INSPRA) 25 MG tablet Take 1 tablet (25 mg total) by mouth daily. 90 tablet 3  . furosemide (LASIX) 20 MG tablet Take 2 tablets (40 mg total) by mouth as needed. 180 tablet 0  . inFLIXimab in sodium chloride 0.9 % Inject into the vein every 6 (six) weeks.    . midodrine (PROAMATINE) 2.5 MG tablet Take 1 tablet (2.5 mg total) by mouth 3 (three) times daily with meals. 90 tablet 3  . prasugrel (EFFIENT) 10 MG TABS tablet Take 1 tablet (10 mg total) by mouth daily. 90 tablet 3  . tamsulosin (FLOMAX) 0.4 MG CAPS capsule TAKE 1 CAPSULE(0.4 MG) BY MOUTH DAILY 90 capsule 1   No current facility-administered medications for this encounter.    No Known Allergies    Social History   Socioeconomic History  . Marital status: Single    Spouse name: Not on  file  . Number of children: Not on file  . Years of education: Not on file  . Highest education level: Not on file  Occupational History  . 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 Smoking status: Current Every Day Smoker    Packs/day: 0.50    Years: 40.00    Pack years: 20.00    Types: Cigarettes  . Smokeless tobacco: Never Used  Substance and Sexual Activity  . Alcohol use: Yes    Comment: daily  . Drug use: No  . Sexual activity: Not on file  Other Topics Concern  . Not on file  Social History Narrative  . Not on file   Social Determinants of  Health   Financial Resource Strain:   . Difficulty of Paying Living Expenses: Not on file  Food Insecurity:   . Worried About Charity fundraiser in the Last Year: Not on file  . Ran Out of Food in the Last Year: Not on file  Transportation Needs:   . Lack of Transportation (Medical): Not on file  . Lack of Transportation (Non-Medical): Not on file  Physical Activity:   . Days of Exercise per Week: Not on file  . Minutes of Exercise per Session: Not on file  Stress:   . Feeling of Stress : Not on file  Social Connections:   . Frequency of Communication with Friends and Family: Not on file  . Frequency of Social Gatherings with Friends and Family: Not on file  . Attends Religious Services: Not on file  . Active Member of Clubs or Organizations: Not on file  . Attends Archivist Meetings: Not on file  . Marital Status: Not on file  Intimate Partner Violence:   . Fear of Current or Ex-Partner: Not on file  . Emotionally Abused: Not on file  . Physically Abused: Not on file  . Sexually Abused: Not on file      Family History  Problem Relation Age of Onset  . Depression Father   . Arthritis Sister        RA  . Heart failure Mother   . Other Neg Hx   . Colon cancer Neg Hx   . Esophageal cancer Neg Hx   . Rectal cancer Neg Hx   . Stomach cancer Neg Hx     Vitals:   06/10/19 0932  BP: 103/64  Pulse: 92  SpO2: 97%  Weight: 88.4 kg (194 lb 12.8 oz)   Wt Readings from Last 3 Encounters:  06/10/19 88.4 kg (194 lb 12.8 oz)  05/27/19 88.7 kg (195 lb 8.8 oz)  05/20/19 86.6 kg (191 lb)   PHYSICAL EXAM: General:  Well appearing. No respiratory difficulty HEENT: normal Neck: supple. no JVD. Carotids 2+ bilat; no bruits. No lymphadenopathy or thyromegaly appreciated. Cor: PMI nondisplaced. Regular rhythm, tachy rate. No rubs, gallops. 2/6 AS. Lungs: clear Abdomen: soft, nontender, nondistended. No hepatosplenomegaly. No bruits or masses. Good bowel sounds.  Extremities: no cyanosis, clubbing, rash, edema Neuro: alert & oriented x 3, cranial nerves grossly intact. moves all 4 extremities w/o difficulty. Affect pleasant.  ASSESSMENT & PLAN:  1. CAD: s/p anterior STEMI 12/20. LHC showed chronically occluded RCA (with L>>R collaterals) and thrombotic occlusion of proximal LAD. Underwent PCI of LAD. - No chest pain.  - continue DAPT w/ ASA + Brilinta for a minimum of 12 months - continue high intensity statin. LDL goal < 70 (79 mg/dL 12/20). Check lipids  today.  - Continue Coreg 3.125 mg bid  2.  Chronic systolic heart failure/ischemic cardiomyopathy:  04/27/19 ECHO- EF post anterior MI 30 to 35%. NYHA II. Volume status stable. Continue lasix needed.  - Stop midodrine.  -Continue eplerenone 12.5 mg daily -Continue digoxin 0.125 mg daily.   - Continue low-dose beta-blocker, Coreg 3.125 mg twice daily -Add 12.5 mg losartan at bed time.  - If EF remains less than 35%, will refer to EP for consideration for ICD. -Set up ECHO in 8 weeks.   3.  Tobacco abuse: Former smoker.  Recently quit after myocardial infarction.   Quit smoking 45 days ago   4.  Hyperlipidemia: Recent LDL 7 9 mg/dL.  Goal in the setting of known coronary disease and recent myocardial infarction is less than 70 mg/dL.  Reports full compliance with atorvastatin 80 mg and tolerating well without side effects. -Repeat fasting lipid panel hepatic function today   5. ETOH  Quit drinking alcohol 45 days ago.  6. Aortic Stenosis Mild-mod on ECHO   Follow up in 4 weeks. Consider entersto next visit. Check BMET, lipids, lfts today.  Follow up in 8 weeks with an ECHO and Dr Donnamae Jude, NP 06/10/19

## 2019-06-10 ENCOUNTER — Other Ambulatory Visit (HOSPITAL_COMMUNITY): Payer: Self-pay

## 2019-06-10 ENCOUNTER — Other Ambulatory Visit: Payer: Self-pay

## 2019-06-10 ENCOUNTER — Telehealth (HOSPITAL_COMMUNITY): Payer: Self-pay | Admitting: Cardiology

## 2019-06-10 ENCOUNTER — Telehealth (HOSPITAL_COMMUNITY): Payer: Self-pay

## 2019-06-10 ENCOUNTER — Inpatient Hospital Stay (HOSPITAL_COMMUNITY)
Admission: RE | Admit: 2019-06-10 | Discharge: 2019-06-10 | Disposition: A | Discharge status: Home / Self Care | Payer: Medicare Other | Source: Ambulatory Visit

## 2019-06-10 ENCOUNTER — Encounter (HOSPITAL_COMMUNITY): Payer: Self-pay

## 2019-06-10 ENCOUNTER — Ambulatory Visit (HOSPITAL_COMMUNITY)
Admission: RE | Admit: 2019-06-10 | Discharge: 2019-06-10 | Disposition: A | Discharge status: Home / Self Care | Payer: Medicare Other | Source: Ambulatory Visit | Attending: Adult Health | Admitting: Adult Health

## 2019-06-10 VITALS — BP 103/64 | HR 92 | Wt 194.8 lb

## 2019-06-10 DIAGNOSIS — Z79899 Other long term (current) drug therapy: Secondary | ICD-10-CM | POA: Insufficient documentation

## 2019-06-10 DIAGNOSIS — I5022 Chronic systolic (congestive) heart failure: Secondary | ICD-10-CM | POA: Diagnosis not present

## 2019-06-10 DIAGNOSIS — Z955 Presence of coronary angioplasty implant and graft: Secondary | ICD-10-CM | POA: Insufficient documentation

## 2019-06-10 DIAGNOSIS — Z7982 Long term (current) use of aspirin: Secondary | ICD-10-CM | POA: Insufficient documentation

## 2019-06-10 DIAGNOSIS — I350 Nonrheumatic aortic (valve) stenosis: Secondary | ICD-10-CM

## 2019-06-10 DIAGNOSIS — I110 Hypertensive heart disease with heart failure: Secondary | ICD-10-CM | POA: Insufficient documentation

## 2019-06-10 DIAGNOSIS — I2510 Atherosclerotic heart disease of native coronary artery without angina pectoris: Secondary | ICD-10-CM

## 2019-06-10 DIAGNOSIS — Z8261 Family history of arthritis: Secondary | ICD-10-CM | POA: Insufficient documentation

## 2019-06-10 DIAGNOSIS — I255 Ischemic cardiomyopathy: Secondary | ICD-10-CM | POA: Diagnosis not present

## 2019-06-10 DIAGNOSIS — M1990 Unspecified osteoarthritis, unspecified site: Secondary | ICD-10-CM | POA: Diagnosis not present

## 2019-06-10 DIAGNOSIS — E785 Hyperlipidemia, unspecified: Secondary | ICD-10-CM

## 2019-06-10 DIAGNOSIS — I252 Old myocardial infarction: Secondary | ICD-10-CM | POA: Insufficient documentation

## 2019-06-10 DIAGNOSIS — Z8249 Family history of ischemic heart disease and other diseases of the circulatory system: Secondary | ICD-10-CM | POA: Diagnosis not present

## 2019-06-10 DIAGNOSIS — Z87891 Personal history of nicotine dependence: Secondary | ICD-10-CM | POA: Diagnosis not present

## 2019-06-10 DIAGNOSIS — Z9861 Coronary angioplasty status: Secondary | ICD-10-CM

## 2019-06-10 DIAGNOSIS — F1021 Alcohol dependence, in remission: Secondary | ICD-10-CM | POA: Insufficient documentation

## 2019-06-10 DIAGNOSIS — N400 Enlarged prostate without lower urinary tract symptoms: Secondary | ICD-10-CM | POA: Diagnosis not present

## 2019-06-10 LAB — BASIC METABOLIC PANEL
Anion gap: 6 (ref 5–15)
BUN: 12 mg/dL (ref 8–23)
CO2: 26 mmol/L (ref 22–32)
Calcium: 9.3 mg/dL (ref 8.9–10.3)
Chloride: 105 mmol/L (ref 98–111)
Creatinine, Ser: 0.87 mg/dL (ref 0.61–1.24)
GFR calc Af Amer: >60 mL/min (ref >60)
GFR calc non Af Amer: >60 mL/min (ref >60)
Glucose, Bld: 100 mg/dL — ABNORMAL HIGH (ref 70–99)
Potassium: 3.9 mmol/L (ref 3.5–5.1)
Sodium: 137 mmol/L (ref 135–145)

## 2019-06-10 LAB — LIPID PANEL
Cholesterol: 152 mg/dL (ref 0–200)
HDL: 35 mg/dL — ABNORMAL LOW (ref >40)
LDL Cholesterol: 86 mg/dL (ref 0–99)
Total CHOL/HDL Ratio: 4.3 RATIO
Triglycerides: 154 mg/dL — ABNORMAL HIGH (ref <150)
VLDL: 31 mg/dL (ref 0–40)

## 2019-06-10 LAB — HEPATIC FUNCTION PANEL
ALT: 22 U/L (ref 0–44)
AST: 20 U/L (ref 15–41)
Albumin: 3.6 g/dL (ref 3.5–5.0)
Alkaline Phosphatase: 82 U/L (ref 38–126)
Bilirubin, Direct: 0.1 mg/dL (ref 0.0–0.2)
Indirect Bilirubin: 0.5 mg/dL (ref 0.3–0.9)
Total Bilirubin: 0.6 mg/dL (ref 0.3–1.2)
Total Protein: 6.7 g/dL (ref 6.5–8.1)

## 2019-06-10 MED ORDER — LOSARTAN POTASSIUM 25 MG PO TABS: 12.5000 mg | ORAL_TABLET | Freq: Every day | ORAL | 3 refills | Status: DC

## 2019-06-10 NOTE — Telephone Encounter (Signed)
-----   Message from Conrad [place], NP sent at 06/10/2019  1:07 PM EST ----- Renal function stable. No change.   Liver function stable. LDL still high. Refer to lipid clinic.

## 2019-06-10 NOTE — Telephone Encounter (Signed)
Pt called to say that rx for losartan was sent to wrong pharmacy.  Rx rerouted to correct pharmacy. Pt aware of same.

## 2019-06-10 NOTE — Progress Notes (Signed)
Nutrition Note: Virtual Visit  Spoke with pt for virtual cardiac rehab. Pt says he is exercising daily at home.  Diet recall reviewed. Nutrition goals reviewed. [place] [name] has been working on eliminating regular sodas d/t A1C of 6.0%. We have been discussing refined vs unrefined carbs and how to incorporate these. We discussed fiber intake with goal of 28-40 g/day. Provided overview of heart healthy diet. [place] [name] was able to use teach back method to discuss saturated fat and LDL, fiber and blood sugar/LDL.  Activity: label reading and incorporate whole grains, nuts, seeds, beans, and fruits and veggies Handouts: fiber content of food Reinforced education and goals with summary email.  Will continue to monitor pt with weekly phone calls for virtual cardiac rehab.   [name], MS, RDN, LDN

## 2019-06-10 NOTE — Patient Instructions (Addendum)
Lab work done today. We will notify you of any abnormal lab work. No news is good news!  STOP Midodrine  START Losartan 12.5mg  tab daily at bedtime.  Your physician has requested that you have an echocardiogram. Echocardiography is a painless test that uses sound waves to create images of your heart. It provides your doctor with information about the size and shape of your heart and how well your heart's chambers and valves are working. This procedure takes approximately one hour. There are no restrictions for this procedure. This will be done at your follow up appointment in 8 weeks.  Please follow up with the [facility] in 4 weeks and again in 8 weeks with an echocardiogram.  At the [facility], you and your health needs are our priority. As part of our continuing mission to provide you with exceptional heart care, we have created designated Provider Care Teams. These Care Teams include your primary Cardiologist (physician) and Advanced Practice Providers (APPs- Physician Assistants and Nurse Practitioners) who all work together to provide you with the care you need, when you need it.   You may see any of the following providers on your designated Care Team at your next follow up: [name] Dr Glori Bickers . Dr Loralie Champagne . Darrick Grinder, NP . Lyda Jester, PA . Audry Riles, PharmD   Please be sure to bring in all your medications bottles to every appointment.

## 2019-06-10 NOTE — Telephone Encounter (Signed)
[phone] (M) Pt aware and voiced understanding Referral placed    Conrad [place], NP  06/10/2019 1:07 PM EST    Renal function stable. No change.   Liver function stable. LDL still high. Refer to lipid clinic.

## 2019-06-11 ENCOUNTER — Ambulatory Visit: Payer: Medicare Other

## 2019-06-11 ENCOUNTER — Encounter (HOSPITAL_COMMUNITY)
Admission: RE | Admit: 2019-06-11 | Discharge: 2019-06-11 | Disposition: A | Discharge status: Home / Self Care | Payer: Self-pay | Source: Ambulatory Visit | Attending: Cardiology | Admitting: Cardiology

## 2019-06-11 DIAGNOSIS — I2102 ST elevation (STEMI) myocardial infarction involving left anterior descending coronary artery: Secondary | ICD-10-CM

## 2019-06-11 DIAGNOSIS — Z955 Presence of coronary angioplasty implant and graft: Secondary | ICD-10-CM

## 2019-06-11 NOTE — Progress Notes (Signed)
Virtual Cardiac Rehab Note:  Successful telephone encounter to [name] to follow up on his daily exercise, shortness of breath, and compliance with his cardiac rehab plan of care. [name] states he is doing well. In person follow up with HF clinic 06/10/19. Notes reviewed. Midodrine discontinued and Losartan added. Patient denies side effects from medication change. He reports mild orthopnea yesterday while napping. Took 20 mg lasix with some improvement. Able to sleep better last night however still noticed mild shortness of breath laying down. No significant weight gain however states his rings are tight. His "normal" dosage for PRN lasix is 40 mg. Patient encouraged to take an additional 20 today and to take medications as prescribed.  [place] [name] is walking 45 min/day. This is divided up into 3 15 min exercise sessions. He is encouraged to decrease his exercise sessions to twice daily and increase the duration of his exercise time. He verbalizes understanding. Continues to remain abstinent from smoking or ETOH intake.  Plan: Will continue to follow patient via [employer] app/telephonically  [place] [name] E. Rollene Rotunda RN, BSN [place]. [facility] Cardiac and Pulmonary Rehab [place] Direct: [phone]

## 2019-06-16 ENCOUNTER — Other Ambulatory Visit: Payer: Self-pay | Admitting: Internal Medicine

## 2019-06-16 ENCOUNTER — Encounter: Payer: Self-pay | Admitting: Internal Medicine

## 2019-06-16 ENCOUNTER — Ambulatory Visit (INDEPENDENT_AMBULATORY_CARE_PROVIDER_SITE_OTHER): Payer: Medicare Other | Admitting: Internal Medicine

## 2019-06-16 ENCOUNTER — Other Ambulatory Visit: Payer: Self-pay

## 2019-06-16 VITALS — BP 109/76 | HR 98 | Temp 97.2°F | Ht 72.0 in | Wt 196.0 lb

## 2019-06-16 DIAGNOSIS — I5022 Chronic systolic (congestive) heart failure: Secondary | ICD-10-CM

## 2019-06-16 DIAGNOSIS — I255 Ischemic cardiomyopathy: Secondary | ICD-10-CM

## 2019-06-16 DIAGNOSIS — E785 Hyperlipidemia, unspecified: Secondary | ICD-10-CM | POA: Diagnosis not present

## 2019-06-16 DIAGNOSIS — I2510 Atherosclerotic heart disease of native coronary artery without angina pectoris: Secondary | ICD-10-CM | POA: Diagnosis not present

## 2019-06-16 MED ORDER — EZETIMIBE 10 MG PO TABS: 10.0000 mg | ORAL_TABLET | Freq: Every day | ORAL | 3 refills | Status: DC

## 2019-06-16 NOTE — Progress Notes (Signed)
LIPID CLINIC CONSULT NOTE  Chief Complaint:  Manage dyslipidemia  Primary Care Physician: [name], MD  Primary Cardiologist:  [name], MD  HPI:  Brandon [name] is a 64 y.o. male who is being seen today for the evaluation of dyslipidemia at the request of Clegg, Amy D, NP.  This is a pleasant 64 year old male kindly referred from the heart failure clinic for management of dyslipidemia.  Unfortunately he has suffered an MI with a delayed presentation and evidence of cardiogenic shock.  LVEF is 30 to 35%.  He required intensive care but is made significant improvement.  He is also made major lifestyle changes.  He says he is stop smoking and has been abstinent from alcohol for about 2 months.  He previously drank about 6-8 beers a day, most days during the week.  He said he did not have any lifestyle limiting issues with alcohol.  He is not clear of his parents cardiac history, but does not clearly have a family history of early onset heart disease.  Recently a lipid profile showed total cholesterol 152, triglycerides 154, HDL 35 and LDL 86.  He is on high potency atorvastatin 80 mg daily.  PMHx:  Past Medical History:  Diagnosis Date  . Ankylosing spondylitis ([place])   . Arthritis   . BENIGN PROSTATIC HYPERTROPHY 06/07/2008  . COLONIC POLYPS, HX OF 06/07/2008  . H/O hiatal hernia   . Heart murmur   . HYPERLIPIDEMIA 06/07/2008  . HYPERTENSION 06/07/2008  . Myocardial infarction [facility] [place]) 2005   NSTEMI, s/p LAD stent  . NEPHROLITHIASIS, HX OF 06/07/2008  . STEMI (ST elevation myocardial infarction) ([place]) 04/27/2019    Past Surgical History:  Procedure Laterality Date  . CORONARY ANGIOPLASTY WITH STENT PLACEMENT  2005   LAD stent, jailed diagonal  . CORONARY/GRAFT ACUTE MI REVASCULARIZATION N/A 04/27/2019   Procedure: Coronary/Graft Acute MI Revascularization;  Surgeon: [name], MD;  Location: [facility];  Service: Cardiovascular;  Laterality: N/A;  .  LAMINECTOMY     lumbar  . LEFT HEART CATH AND CORONARY ANGIOGRAPHY N/A 04/27/2019   Procedure: LEFT HEART CATH AND CORONARY ANGIOGRAPHY;  Surgeon: [name], MD;  Location: [place];  Service: Cardiovascular;  Laterality: N/A;  . LUMBAR FUSION  01/2012  . RIGHT HEART CATH N/A 04/27/2019   Procedure: RIGHT HEART CATH;  Surgeon: [name], Luay M, MD;  Location: [place] [facility];  Service: Cardiovascular;  Laterality: N/A;  . TONSILLECTOMY    . warthin tumor removal Left 2014    FAMHx:  Family History  Problem Relation Age of Onset  . Depression Father   . Arthritis Sister        RA  . Heart failure Mother   . Other Neg Hx   . Colon cancer Neg Hx   . Esophageal cancer Neg Hx   . Rectal cancer Neg Hx   . Stomach cancer Neg Hx     SOCHx:   reports that he has been smoking cigarettes. He has a 20.00 pack-year smoking history. He has never used smokeless tobacco. He reports current alcohol use. He reports that he does not use drugs.  ALLERGIES:  No Known Allergies  ROS: Pertinent items noted in HPI and remainder of comprehensive ROS otherwise negative.  HOME MEDS: Current Outpatient Medications on File Prior to Visit  Medication Sig Dispense Refill  . aspirin 81 MG chewable tablet Chew 1 tablet (81 mg total) by mouth daily. [place]  tablet 3  . atorvastatin (LIPITOR) 80 MG tablet TAKE 1 TABLET BY MOUTH ONCE DAILY AT 6PM. 90 tablet 3  . carvedilol (COREG) 3.125 MG tablet Take 1 tablet (3.125 mg total) by mouth 2 (two) times daily. 180 tablet 3  . digoxin (LANOXIN) 0.125 MG tablet Take 1 tablet (0.125 mg total) by mouth daily. 30 tablet 3  . eplerenone (INSPRA) 25 MG tablet Take 1 tablet (25 mg total) by mouth daily. 90 tablet 3  . furosemide (LASIX) 20 MG tablet Take 2 tablets (40 mg total) by mouth as needed. 180 tablet 0  . inFLIXimab in sodium chloride 0.9 % Inject into the vein every 6 (six) weeks.    [name] losartan (COZAAR) 25 MG tablet Take 0.5 tablets (12.5 mg  total) by mouth daily. 45 tablet 3  . prasugrel (EFFIENT) 10 MG TABS tablet Take 1 tablet (10 mg total) by mouth daily. 90 tablet 3  . tamsulosin (FLOMAX) 0.4 MG CAPS capsule TAKE 1 CAPSULE(0.4 MG) BY MOUTH DAILY 90 capsule 1   No current facility-administered medications on file prior to visit.    LABS/IMAGING: No results found for this or any previous visit (from the past 48 hour(s)). No results found.  LIPID PANEL:    Component Value Date/Time   CHOL 152 06/10/2019 1000   TRIG 154 (H) 06/10/2019 1000   HDL 35 (L) 06/10/2019 1000   CHOLHDL 4.3 06/10/2019 1000   VLDL 31 06/10/2019 1000   LDLCALC 86 06/10/2019 1000    WEIGHTS: Wt Readings from Last 3 Encounters:  06/16/19 196 lb (88.9 kg)  06/10/19 194 lb 12.8 oz (88.4 kg)  05/27/19 195 lb 8.8 oz (88.7 kg)    VITALS: BP 109/76   Pulse 98   Temp (!) 97.2 F (36.2 C)   Ht 6' (1.829 m)   Wt 196 lb (88.9 kg)   SpO2 99%   BMI 26.58 kg/m   EXAM: Deferred  EKG: Deferred  ASSESSMENT: 1. Mixed dyslipidemia, goal LDL less than 70 2. Ischemic cardiomyopathy, EF 30 to 35% 3. Recent acute anterior wall MI 4. Tobacco abuse- quit 5. Etoh use - quit  PLAN: 1.   Brandon [name] has a mixed dyslipidemia and I agree his target LDL is less than 70.  He currently has an LDL of 86 however recently stopped alcohol use and that may go up.  He is on high potency atorvastatin 80 mg daily.  I advise adding ezetimibe 10 mg daily to his regimen.  He will continue to work on dietary reduction of saturated fats.  He should also remain active and continue cardiac rehabilitation.  We will plan to repeat lipid in 3 months and follow-up with me afterwards.  Thanks again for the kind referral.  Pixie Casino, MD, FACC, [place] Director of the [facility] Lipid Disorders &  Cardiovascular Risk Reduction Clinic Diplomate of the American Board of Clinical Lipidology Attending Cardiologist  Direct Dial:  [phone]  Fax: [phone]  Website:  [email or web address] 06/16/2019, 3:00 PM

## 2019-06-16 NOTE — Telephone Encounter (Signed)
Patient is requesting to have ezetimibe (ZETIA) 10 MG tablet medication prescription refill order sent to a different pharmacy due to finances. Patient states that the medication is $130.52 at the [facility] and he is requesting to have the medication sent to [facility]. Please call to confirm.  *STAT* If patient is at the pharmacy, call can be transferred to refill team.   1. Which medications need to be refilled? (please list name of each medication and dose if known) ezetimibe (ZETIA) 10 MG tablet  2. Which pharmacy/location (including street and city if local pharmacy) is medication to be sent to? [place] [address] [facility]  3. Do they need a 30 day or 90 day supply? 90 day supply

## 2019-06-16 NOTE — Patient Instructions (Signed)
Medication Instructions:  START zetia 10 mg daily  Continue current medications *If you need a refill on your cardiac medications before your next appointment, please call your pharmacy*  Lab Work: FASTING lab work in 3-4 months prior to next appointment   If you have labs (blood work) drawn today and your tests are completely normal, you will receive your results only by: [name] MyChart Message (if you have MyChart) OR . A paper copy in the mail If you have any lab test that is abnormal or we need to change your treatment, we will call you to review the results.  Testing/Procedures: NONE  Follow-Up: At [facility], you and your health needs are our priority.  As part of our continuing mission to provide you with exceptional heart care, we have created designated Provider Care Teams.  These Care Teams include your primary Cardiologist (physician) and Advanced Practice Providers (APPs -  Physician Assistants and Nurse Practitioners) who all work together to provide you with the care you need, when you need it.  Your next appointment:   3-4 month(s)  The format for your next appointment:   Either In Person or Virtual  Provider:   Raliegh Ip Mali Hilty, MD  Other Instructions

## 2019-06-17 MED ORDER — EZETIMIBE 10 MG PO TABS: 10.0000 mg | ORAL_TABLET | Freq: Every day | ORAL | 3 refills | Status: DC

## 2019-06-18 ENCOUNTER — Other Ambulatory Visit: Payer: Self-pay

## 2019-06-18 ENCOUNTER — Ambulatory Visit: Payer: Medicare Other | Attending: Internal Medicine

## 2019-06-18 ENCOUNTER — Telehealth (HOSPITAL_COMMUNITY): Payer: Self-pay

## 2019-06-18 ENCOUNTER — Encounter: Payer: Self-pay | Admitting: Family Medicine

## 2019-06-18 ENCOUNTER — Encounter (HOSPITAL_COMMUNITY)
Admission: RE | Admit: 2019-06-18 | Discharge: 2019-06-18 | Disposition: A | Discharge status: Home / Self Care | Payer: Self-pay | Source: Ambulatory Visit | Attending: Cardiology | Admitting: Cardiology

## 2019-06-18 DIAGNOSIS — Z955 Presence of coronary angioplasty implant and graft: Secondary | ICD-10-CM | POA: Insufficient documentation

## 2019-06-18 DIAGNOSIS — I2102 ST elevation (STEMI) myocardial infarction involving left anterior descending coronary artery: Secondary | ICD-10-CM | POA: Insufficient documentation

## 2019-06-18 DIAGNOSIS — Z23 Encounter for immunization: Secondary | ICD-10-CM | POA: Insufficient documentation

## 2019-06-18 NOTE — Telephone Encounter (Signed)
Cardiac Rehab Note:   Unsuccessful telephone encounter to Mr. Andrey Campanile to follow up on recent suggested change in daily exercise. Mr. Kedzierski was encouraged during last encounter to convert his 15 min 3/day to 20-25 min bid. Hip[employer] compliant VM message left requesting call back.  Dyana Magner E. Rollene Rotunda RN, BSN [place]. [facility] Cardiac and Pulmonary Rehab [place] Direct: [phone]

## 2019-06-18 NOTE — Progress Notes (Signed)
Virtual Cardiac Rehab Note:  Successful telephone encounter to [name] to follow up on recent changes to his exercise. [name] states extending his walking time to 2 20-25 min sessions instead of 3 86min sessions is working well. He admits to being "tired" after 20 min but not exhausted. He feels it is getting easier by the day. VSS. Continues to take medications as prescribed and follows a low sodium diet as directed by RD. Patient appreciative of the close follow-up from CR staff.  Of note, patient received his first Covid-19 vaccine today with second appointment scheduled 07/13/19.  Plan: Will continue to follow  Brandon [name] E. Rollene Rotunda RN, BSN [place]. [facility] Cardiac and Pulmonary Rehab [place] Direct: [phone]

## 2019-06-18 NOTE — Progress Notes (Signed)
   Covid-19 Vaccination Clinic  Name:  TEJA JUDICE    MRN: [id number] DOB: 1955/09/05  06/18/2019  Mr. Waldrop was observed post Covid-19 immunization for 15 minutes without incidence. He was provided with Vaccine Information Sheet and instruction to access the V-Safe system.   Mr. Haverland was instructed to call 911 with any severe reactions post vaccine: [name] Difficulty breathing  . Swelling of your face and throat  . A fast heartbeat  . A bad rash all over your body  . Dizziness and weakness    Immunizations Administered    Name Date Dose VIS Date Route   Pfizer COVID-19 Vaccine 06/18/2019  9:06 AM 0.3 mL 04/23/2019 Intramuscular   Manufacturer: [place]   Lot: [id number]   [place]: [id number]

## 2019-06-21 ENCOUNTER — Telehealth (HOSPITAL_COMMUNITY): Payer: Self-pay

## 2019-06-21 ENCOUNTER — Encounter (HOSPITAL_COMMUNITY): Payer: Self-pay

## 2019-06-21 DIAGNOSIS — Z955 Presence of coronary angioplasty implant and graft: Secondary | ICD-10-CM

## 2019-06-21 DIAGNOSIS — I2102 ST elevation (STEMI) myocardial infarction involving left anterior descending coronary artery: Secondary | ICD-10-CM

## 2019-06-21 NOTE — Telephone Encounter (Signed)
Rx has been sent to the pharmacy electronically. ° °

## 2019-06-21 NOTE — Telephone Encounter (Signed)
Pt insurance is active and benefits verified through Medicare a/b Co-pay 0, DED $203/$203 met, out of pocket 0/0 met, co-insurance 20%. no pre-authorization required. Passport, 06/21/2019@2:41pm, REF# [id number]  Will contact patient to see if he is interested in the Cardiac Rehab Program. If interested, patient will need to complete follow up appt. Once completed, patient will be contacted for scheduling upon review by the RN Navigator.  2ndary insurance is active and benefits verified through [employer]. Co-pay 0, DED 0/0 met, out of pocket 0/0 met, co-insurance 0. No pre-authorization required. 

## 2019-06-21 NOTE — Addendum Note (Signed)
Encounter addended by: Sol Passer on: 06/21/2019 1:25 PM  Actions taken: Flowsheet data copied forward, Flowsheet accepted

## 2019-06-21 NOTE — Progress Notes (Signed)
Cardiac Individual Treatment Plan  Patient Details  Name: Brandon [name] MRN: [id number] Date of Birth: 04/21/1956 Referring Provider:     CARDIAC REHAB PHASE II ORIENTATION from 05/27/2019 in [facility]  Referring Provider  [name], MD      Initial Encounter Date:    CARDIAC REHAB PHASE II ORIENTATION from 05/27/2019 in [facility]  Date  05/27/19      Visit Diagnosis: S/P coronary artery stent placement S/P DES LAD 04/27/19  ST elevation myocardial infarction involving left anterior descending (LAD) coronary artery ([place]) 04/27/19  Patient's Home Medications on Admission:  Current Outpatient Medications:  .  aspirin 81 MG chewable tablet, Chew 1 tablet (81 mg total) by mouth daily., Disp: 30 tablet, Rfl: 3 .  atorvastatin (LIPITOR) 80 MG tablet, TAKE 1 TABLET BY MOUTH ONCE DAILY AT 6PM., Disp: 90 tablet, Rfl: 3 .  carvedilol (COREG) 3.125 MG tablet, Take 1 tablet (3.125 mg total) by mouth 2 (two) times daily., Disp: 180 tablet, Rfl: 3 .  digoxin (LANOXIN) 0.125 MG tablet, Take 1 tablet (0.125 mg total) by mouth daily., Disp: 30 tablet, Rfl: 3 .  eplerenone (INSPRA) 25 MG tablet, Take 1 tablet (25 mg total) by mouth daily., Disp: 90 tablet, Rfl: 3 .  ezetimibe (ZETIA) 10 MG tablet, Take 1 tablet (10 mg total) by mouth daily., Disp: 90 tablet, Rfl: 3 .  furosemide (LASIX) 20 MG tablet, Take 2 tablets (40 mg total) by mouth as needed., Disp: 180 tablet, Rfl: 0 .  inFLIXimab in sodium chloride 0.9 %, Inject into the vein every 6 (six) weeks., Disp: , Rfl:  .  losartan (COZAAR) 25 MG tablet, Take 0.5 tablets (12.5 mg total) by mouth daily., Disp: 45 tablet, Rfl: 3 .  prasugrel (EFFIENT) 10 MG TABS tablet, Take 1 tablet (10 mg total) by mouth daily., Disp: 90 tablet, Rfl: 3 .  tamsulosin (FLOMAX) 0.4 MG CAPS capsule, TAKE 1 CAPSULE(0.4 MG) BY MOUTH DAILY, Disp: 90 capsule, Rfl: 1  Past Medical History: Past Medical  History:  Diagnosis Date  . Ankylosing spondylitis ([place])   . Arthritis   . BENIGN PROSTATIC HYPERTROPHY 06/07/2008  . COLONIC POLYPS, HX OF 06/07/2008  . H/O hiatal hernia   . Heart murmur   . HYPERLIPIDEMIA 06/07/2008  . HYPERTENSION 06/07/2008  . Myocardial infarction [facility]) 2005   NSTEMI, s/p LAD stent  . NEPHROLITHIASIS, HX OF 06/07/2008  . STEMI (ST elevation myocardial infarction) ([place]) 04/27/2019    Tobacco Use: Social History   Tobacco Use  Smoking Status Current Every Day Smoker  . Packs/day: 0.50  . Years: 40.00  . Pack years: 20.00  . Types: Cigarettes  Smokeless Tobacco Never Used    Labs: Recent Review Flowsheet Data    Labs for ITP Cardiac and Pulmonary Rehab Latest Ref Rng & Units 05/01/2019 05/02/2019 05/03/2019 05/04/2019 06/10/2019   Cholestrol 0 - 200 mg/dL - - - - 152   LDLCALC 0 - 99 mg/dL - - - - 86   HDL >40 mg/dL - - - - 35(L)   Trlycerides <150 mg/dL - - - - 154(H)   Hemoglobin A1c 4.8 - 5.6 % - - - - -   HCO3 20.0 - 28.0 mmol/L - - - - -   TCO2 22 - 32 mmol/L - - - - -   ACIDBASEDEF 0.0 - 2.0 mmol/L - - - - -   O2SAT % 68.7 74.2 75.2 69.3 -  Capillary Blood Glucose: No results found for: GLUCAP   Exercise Target Goals: Exercise Program Goal: Individual exercise prescription set using results from initial 6 min walk test and THRR while considering  patient's activity barriers and safety.   Exercise Prescription Goal: Initial exercise prescription builds to 30-45 minutes a day of aerobic activity, 2-3 days per week.  Home exercise guidelines will be given to patient during program as part of exercise prescription that the participant will acknowledge.  Activity Barriers & Risk Stratification: Activity Barriers & Cardiac Risk Stratification - 05/27/19 1230      Activity Barriers & Cardiac Risk Stratification   Activity Barriers  Shortness of Breath;Other (comment)    Comments  Ankylosing spondylitis (back, neck, hips).    Cardiac Risk  Stratification  High       6 Minute Walk: 6 Minute Walk    Row Name 05/27/19 1248         6 Minute Walk   Phase  Initial     Distance  1104 feet     Walk Time  6 minutes     # of Rest Breaks  0     MPH  2.09     METS  2.92     RPE  15     Perceived Dyspnea   1     Symptoms  Yes (comment)     Comments  Patient c/o bilateral hip pain "6/10" on the pain scale, which is chronic for him. Pt c/o mild shortness of breath.     Resting HR  92 bpm     Resting BP  114/62     Resting Oxygen Saturation   98 %     Exercise Oxygen Saturation  during 6 min walk  94 %     Max Ex. HR  114 bpm     Max Ex. BP  102/72     2 Minute Post BP  100/64        Oxygen Initial Assessment:   Oxygen Re-Evaluation:   Oxygen Discharge (Final Oxygen Re-Evaluation):   Initial Exercise Prescription: Initial Exercise Prescription - 05/28/19 0900      Date of Initial Exercise RX and Referring Provider   Date  05/27/19    Referring Provider  [name], MD      Treadmill   Minutes  10      Track   Minutes  10      Prescription Details   Frequency (times per week)  5-7    Duration  Progress to 30 minutes of continuous aerobic without signs/symptoms of physical distress      Intensity   THRR 40-80% of Max Heartrate  62-124    Ratings of Perceived Exertion  11-13    Perceived Dyspnea  0-4      Progression   Progression  Continue to progress workloads to maintain intensity without signs/symptoms of physical distress.      Resistance Training   Training Prescription  Yes    Reps  10-15       Perform Capillary Blood Glucose checks as needed.  Exercise Prescription Changes:  Exercise Prescription Changes    Row Name 06/19/19 1322             Response to Exercise   Heart Rate (Admit)  87 bpm       Heart Rate (Exercise)  105 bpm       Rating of Perceived Exertion (Exercise)  11  Symptoms  none       Comments  Virtual cardiac rehab session       Duration  Progress to  45 minutes of aerobic exercise without signs/symptoms of physical distress       Intensity  THRR unchanged         Progression   Progression  Continue to progress workloads to maintain intensity without signs/symptoms of physical distress.         Interval Training   Interval Training  No         Treadmill   Minutes  --         Track   Minutes  40         Home Exercise Plan   Plans to continue exercise at  Home (comment) Walking       Initial Home Exercises Provided  05/27/19          Exercise Comments:  Exercise Comments    Row Name 05/27/19 1234 06/21/19 1325         Exercise Comments  Reviewed home exercise plan with patient.  Patient will be contacted to begin participation in the onsite cardiac rehab program after temporary closure due to COVID-19 pandemic.         Exercise Goals and Review:  Exercise Goals    Row Name 05/27/19 1234             Exercise Goals   Increase Physical Activity  Yes       Intervention  Develop an individualized exercise prescription for aerobic and resistive training based on initial evaluation findings, risk stratification, comorbidities and participant's personal goals.;Provide advice, education, support and counseling about physical activity/exercise needs.       Expected Outcomes  Short Term: Attend rehab on a regular basis to increase amount of physical activity.;Long Term: Exercising regularly at least 3-5 days a week.;Long Term: Add in home exercise to make exercise part of routine and to increase amount of physical activity.       Increase Strength and Stamina  Yes       Intervention  Provide advice, education, support and counseling about physical activity/exercise needs.;Develop an individualized exercise prescription for aerobic and resistive training based on initial evaluation findings, risk stratification, comorbidities and participant's personal goals.       Expected Outcomes  Short Term: Increase workloads from initial  exercise prescription for resistance, speed, and METs.;Short Term: Perform resistance training exercises routinely during rehab and add in resistance training at home;Long Term: Improve cardiorespiratory fitness, muscular endurance and strength as measured by increased METs and functional capacity (6MWT)       Able to understand and use rate of perceived exertion (RPE) scale  Yes       Intervention  Provide education and explanation on how to use RPE scale       Expected Outcomes  Short Term: Able to use RPE daily in rehab to express subjective intensity level;Long Term:  Able to use RPE to guide intensity level when exercising independently       Knowledge and understanding of Target Heart Rate Range (THRR)  Yes       Intervention  Provide education and explanation of THRR including how the numbers were predicted and where they are located for reference       Expected Outcomes  Short Term: Able to state/look up THRR;Long Term: Able to use THRR to govern intensity when exercising independently;Short Term: Able to use daily as guideline for  intensity in rehab       Able to check pulse independently  Yes       Intervention  Review the importance of being able to check your own pulse for safety during independent exercise;Provide education and demonstration on how to check pulse in carotid and radial arteries.       Expected Outcomes  Short Term: Able to explain why pulse checking is important during independent exercise;Long Term: Able to check pulse independently and accurately       Understanding of Exercise Prescription  Yes       Intervention  Provide education, explanation, and written materials on patient's individual exercise prescription       Expected Outcomes  Short Term: Able to explain program exercise prescription;Long Term: Able to explain home exercise prescription to exercise independently          Exercise Goals Re-Evaluation : Exercise Goals Re-Evaluation    Row Name 06/21/19 1323              Exercise Goal Re-Evaluation   Comments  Patient has been actively participating in the virtual cardiac rehab program via the [employer] app and has been progressing well. Patient is walking 20 minutes, 2-3 times every day for a total of 40-60 minutes daily.       Expected Outcomes  Patient will transition to the onsite cardiac rehab program.          Discharge Exercise Prescription (Final Exercise Prescription Changes): Exercise Prescription Changes - 06/19/19 1322      Response to Exercise   Heart Rate (Admit)  87 bpm    Heart Rate (Exercise)  105 bpm    Rating of Perceived Exertion (Exercise)  11    Symptoms  none    Comments  Virtual cardiac rehab session    Duration  Progress to 45 minutes of aerobic exercise without signs/symptoms of physical distress    Intensity  THRR unchanged      Progression   Progression  Continue to progress workloads to maintain intensity without signs/symptoms of physical distress.      Interval Training   Interval Training  No      Treadmill   Minutes  --      Track   Minutes  40      Home Exercise Plan   Plans to continue exercise at  Home (comment)   Walking   Initial Home Exercises Provided  05/27/19       Nutrition:  Target Goals: Understanding of nutrition guidelines, daily intake of sodium 1500mg , cholesterol 200mg , calories 30% from fat and 7% or less from saturated fats, daily to have 5 or more servings of fruits and vegetables.  Biometrics: Pre Biometrics - 05/27/19 1225      Pre Biometrics   Height  6' 0.75" (1.848 m)    Weight  195 lb 8.8 oz (88.7 kg)    Waist Circumference  42 inches    Hip Circumference  39.25 inches    Waist to Hip Ratio  1.07 %    BMI (Calculated)  25.97    Triceps Skinfold  5 mm    % Body Fat  23 %    Grip Strength  35 kg    Single Leg Stand  2.68 seconds        Nutrition Therapy Plan and Nutrition Goals:   Nutrition Assessments: Nutrition Assessments - 05/27/19 1408       MEDFICTS Scores   Pre Score  49  Nutrition Goals Re-Evaluation:   Nutrition Goals Re-Evaluation:   Nutrition Goals Discharge (Final Nutrition Goals Re-Evaluation):   Psychosocial: Target Goals: Acknowledge presence or absence of significant depression and/or stress, maximize coping skills, provide positive support system. Participant is able to verbalize types and ability to use techniques and skills needed for reducing stress and depression.  Initial Review & Psychosocial Screening: Initial Psych Review & Screening - 05/27/19 1208      Initial Review   Current issues with  None Identified      Family Dynamics   Good Support System?  Yes   Elgie lives alone but has a sister who lives near by for support     Barriers   Psychosocial barriers to participate in program  The patient should benefit from training in stress management and relaxation.      Screening Interventions   Interventions  Encouraged to exercise    Expected Outcomes  Long Term Goal: Stressors or current issues are controlled or eliminated.       Quality of Life Scores: Quality of Life - 05/28/19 1042      Quality of Life   Select  Quality of Life      Quality of Life Scores   Health/Function Pre  10.1 %    Socioeconomic Pre  21.86 %    Psych/Spiritual Pre  10.08 %    Family Pre  17.5 %    GLOBAL Pre  13.19 %      Scores of 19 and below usually indicate a poorer quality of life in these areas.  A difference of  2-3 points is a clinically meaningful difference.  A difference of 2-3 points in the total score of the Quality of Life Index has been associated with significant improvement in overall quality of life, self-image, physical symptoms, and general health in studies assessing change in quality of life.  PHQ-9: Recent Review Flowsheet Data    Depression screen [facility] 2/9 04/15/2019 12/08/2014 03/29/2013   Decreased Interest 0 0 0   Down, Depressed, Hopeless 0 0 0   PHQ - 2 Score 0 0 0      Interpretation of Total Score  Total Score Depression Severity:  1-4 = Minimal depression, 5-9 = Mild depression, 10-14 = Moderate depression, 15-19 = Moderately severe depression, 20-27 = Severe depression   Psychosocial Evaluation and Intervention: Psychosocial Evaluation - 06/21/19 1435      Psychosocial Evaluation & Interventions   Interventions  Encouraged to exercise with the program and follow exercise prescription    Comments  Patient denies psychosocial barriers to participate in virtual cardiac rehab and self health management. He is supported by his sister and friends. He is beginning to feel more positive about his health as his stamina and strength improve.    Expected Outcomes  Patient will continue to have a positive outlook and attitude. He will continue to take depression medications as prescribed. He will continue to focus on the positives of health improvement.    Continue Psychosocial Services   Follow up required by staff       Psychosocial Re-Evaluation:   Psychosocial Discharge (Final Psychosocial Re-Evaluation):   Vocational Rehabilitation: Provide vocational rehab assistance to qualifying candidates.   Vocational Rehab Evaluation & Intervention: Vocational Rehab - 05/28/19 1104      Initial Vocational Rehab Evaluation & Intervention   Assessment shows need for Vocational Rehabilitation  No       Education: Education Goals: Education classes will be provided on a  weekly basis, covering required topics. Participant will state understanding/return demonstration of topics presented.  Learning Barriers/Preferences: Learning Barriers/Preferences - 05/28/19 1103      Learning Barriers/Preferences   Learning Barriers  None    Learning Preferences  Pictoral;Written Material       Education Topics: Count Your Pulse:  -Group instruction provided by verbal instruction, demonstration, patient participation and written materials to support subject.   Instructors address importance of being able to find your pulse and how to count your pulse when at home without a heart monitor.  Patients get hands on experience counting their pulse with staff help and individually.   Heart Attack, Angina, and Risk Factor Modification:  -Group instruction provided by verbal instruction, video, and written materials to support subject.  Instructors address signs and symptoms of angina and heart attacks.    Also discuss risk factors for heart disease and how to make changes to improve heart health risk factors.   Functional Fitness:  -Group instruction provided by verbal instruction, demonstration, patient participation, and written materials to support subject.  Instructors address safety measures for doing things around the house.  Discuss how to get up and down off the floor, how to pick things up properly, how to safely get out of a chair without assistance, and balance training.   Meditation and Mindfulness:  -Group instruction provided by verbal instruction, patient participation, and written materials to support subject.  Instructor addresses importance of mindfulness and meditation practice to help reduce stress and improve awareness.  Instructor also leads participants through a meditation exercise.    Stretching for Flexibility and Mobility:  -Group instruction provided by verbal instruction, patient participation, and written materials to support subject.  Instructors lead participants through series of stretches that are designed to increase flexibility thus improving mobility.  These stretches are additional exercise for major muscle groups that are typically performed during regular warm up and cool down.   Hands Only CPR:  -Group verbal, video, and participation provides a basic overview of AHA guidelines for community CPR. Role-play of emergencies allow participants the opportunity to practice calling for help and chest compression technique with  discussion of AED use.   Hypertension: -Group verbal and written instruction that provides a basic overview of hypertension including the most recent diagnostic guidelines, risk factor reduction with self-care instructions and medication management.    Nutrition I class: Heart Healthy Eating:  -Group instruction provided by PowerPoint slides, verbal discussion, and written materials to support subject matter. The instructor gives an explanation and review of the Therapeutic Lifestyle Changes diet recommendations, which includes a discussion on lipid goals, dietary fat, sodium, fiber, plant stanol/sterol esters, sugar, and the components of a well-balanced, healthy diet.   Nutrition II class: Lifestyle Skills:  -Group instruction provided by PowerPoint slides, verbal discussion, and written materials to support subject matter. The instructor gives an explanation and review of label reading, grocery shopping for heart health, heart healthy recipe modifications, and ways to make healthier choices when eating out.   Diabetes Question & Answer:  -Group instruction provided by PowerPoint slides, verbal discussion, and written materials to support subject matter. The instructor gives an explanation and review of diabetes co-morbidities, pre- and post-prandial blood glucose goals, pre-exercise blood glucose goals, signs, symptoms, and treatment of hypoglycemia and hyperglycemia, and foot care basics.   Diabetes Blitz:  -Group instruction provided by PowerPoint slides, verbal discussion, and written materials to support subject matter. The instructor gives an explanation and review of the physiology  behind type 1 and type 2 diabetes, diabetes medications and rational behind using different medications, pre- and post-prandial blood glucose recommendations and Hemoglobin A1c goals, diabetes diet, and exercise including blood glucose guidelines for exercising safely.    Portion Distortion:  -Group  instruction provided by PowerPoint slides, verbal discussion, written materials, and food models to support subject matter. The instructor gives an explanation of serving size versus portion size, changes in portions sizes over the last 20 years, and what consists of a serving from each food group.   Stress Management:  -Group instruction provided by verbal instruction, video, and written materials to support subject matter.  Instructors review role of stress in heart disease and how to cope with stress positively.     Exercising on Your Own:  -Group instruction provided by verbal instruction, power point, and written materials to support subject.  Instructors discuss benefits of exercise, components of exercise, frequency and intensity of exercise, and end points for exercise.  Also discuss use of nitroglycerin and activating EMS.  Review options of places to exercise outside of rehab.  Review guidelines for sex with heart disease.   Cardiac Drugs I:  -Group instruction provided by verbal instruction and written materials to support subject.  Instructor reviews cardiac drug classes: antiplatelets, anticoagulants, beta blockers, and statins.  Instructor discusses reasons, side effects, and lifestyle considerations for each drug class.   Cardiac Drugs II:  -Group instruction provided by verbal instruction and written materials to support subject.  Instructor reviews cardiac drug classes: angiotensin converting enzyme inhibitors (ACE-I), angiotensin II receptor blockers (ARBs), nitrates, and calcium channel blockers.  Instructor discusses reasons, side effects, and lifestyle considerations for each drug class.   Anatomy and Physiology of the Circulatory System:  Group verbal and written instruction and models provide basic cardiac anatomy and physiology, with the coronary electrical and arterial systems. Review of: AMI, Angina, Valve disease, Heart Failure, Peripheral Artery Disease, Cardiac  Arrhythmia, Pacemakers, and the ICD.   Other Education:  -Group or individual verbal, written, or video instructions that support the educational goals of the cardiac rehab program.   Holiday Eating Survival Tips:  -Group instruction provided by PowerPoint slides, verbal discussion, and written materials to support subject matter. The instructor gives patients tips, tricks, and techniques to help them not only survive but enjoy the holidays despite the onslaught of food that accompanies the holidays.   Knowledge Questionnaire Score: Knowledge Questionnaire Score - 05/28/19 1043      Knowledge Questionnaire Score   Pre Score  25/28       Core Components/Risk Factors/Patient Goals at Admission: Personal Goals and Risk Factors at Admission - 05/28/19 1104      Core Components/Risk Factors/Patient Goals on Admission    Weight Management  Weight Maintenance;Yes    Intervention  Weight Management: Develop a combined nutrition and exercise program designed to reach desired caloric intake, while maintaining appropriate intake of nutrient and fiber, sodium and fats, and appropriate energy expenditure required for the weight goal.;Weight Management: Provide education and appropriate resources to help participant work on and attain dietary goals.;Weight Management/Obesity: Establish reasonable short term and long term weight goals.    Tobacco Cessation  Yes   Brandon [name] quit on 04/27/19 and remains tobacco free   Intervention  Advice worker, assist with locating and accessing local/national Quit Smoking programs, and support quit date choice.    Expected Outcomes  Long Term: Complete abstinence from all tobacco products for at least 12 months from quit date.  Heart Failure  Yes    Intervention  Provide a combined exercise and nutrition program that is supplemented with education, support and counseling about heart failure. Directed toward relieving symptoms such as shortness of  breath, decreased exercise tolerance, and extremity edema.    Expected Outcomes  Improve functional capacity of life;Short term: Daily weights obtained and reported for increase. Utilizing diuretic protocols set by physician.;Short term: Attendance in program 2-3 days a week with increased exercise capacity. Reported lower sodium intake. Reported increased fruit and vegetable intake. Reports medication compliance.;Long term: Adoption of self-care skills and reduction of barriers for early signs and symptoms recognition and intervention leading to self-care maintenance.    Hypertension  Yes    Intervention  Provide education on lifestyle modifcations including regular physical activity/exercise, weight management, moderate sodium restriction and increased consumption of fresh fruit, vegetables, and low fat dairy, alcohol moderation, and smoking cessation.;Monitor prescription use compliance.    Expected Outcomes  Short Term: Continued assessment and intervention until BP is < 140/57mm HG in hypertensive participants. < 130/73mm HG in hypertensive participants with diabetes, heart failure or chronic kidney disease.;Long Term: Maintenance of blood pressure at goal levels.    Lipids  Yes    Intervention  Provide education and support for participant on nutrition & aerobic/resistive exercise along with prescribed medications to achieve LDL 70mg , HDL >40mg .    Expected Outcomes  Short Term: Participant states understanding of desired cholesterol values and is compliant with medications prescribed. Participant is following exercise prescription and nutrition guidelines.;Long Term: Cholesterol controlled with medications as prescribed, with individualized exercise RX and with personalized nutrition plan. Value goals: LDL < 70mg , HDL > 40 mg.    Stress  Yes    Intervention  Offer individual and/or small group education and counseling on adjustment to heart disease, stress management and health-related lifestyle  change. Teach and support self-help strategies.;Refer participants experiencing significant psychosocial distress to appropriate mental health specialists for further evaluation and treatment. When possible, include family members and significant others in education/counseling sessions.    Expected Outcomes  Short Term: Participant demonstrates changes in health-related behavior, relaxation and other stress management skills, ability to obtain effective social support, and compliance with psychotropic medications if prescribed.;Long Term: Emotional wellbeing is indicated by absence of clinically significant psychosocial distress or social isolation.       Core Components/Risk Factors/Patient Goals Review:  Goals and Risk Factor Review    Row Name 06/21/19 1437             Core Components/Risk Factors/Patient Goals Review   Personal Goals Review  Weight Management/Obesity;Lipids;Tobacco Cessation;Stress;Heart Failure;Hypertension       Review  Patient with multiple CAD risk factors.  Continues to participate in cardiac rehab plan of care/exercise utilizing the [facility] virtual cardiac rehab app.       Expected Outcomes  Patient will continue to participate in cardiac rehab for risk factor modification          Core Components/Risk Factors/Patient Goals at Discharge (Final Review):  Goals and Risk Factor Review - 06/21/19 1437      Core Components/Risk Factors/Patient Goals Review   Personal Goals Review  Weight Management/Obesity;Lipids;Tobacco Cessation;Stress;Heart Failure;Hypertension    Review  Patient with multiple CAD risk factors.  Continues to participate in cardiac rehab plan of care/exercise utilizing the [facility] virtual cardiac rehab app.    Expected Outcomes  Patient will continue to participate in cardiac rehab for risk factor modification  ITP Comments: ITP Comments    Row Name 05/27/19 1207 06/21/19 1434         ITP Comments  Dr Fransico Him MD,  Medical Director  30 day ITP review:In-person Cardiac Rehab exercise session will resume. Patient will be contacted to see if interested. Currently patient is following their cardiac rehab plan of care utilizing the [facility] app.         Comments: see ITP comment

## 2019-06-22 ENCOUNTER — Ambulatory Visit: Payer: Medicare Other

## 2019-06-22 ENCOUNTER — Encounter (HOSPITAL_COMMUNITY): Payer: Self-pay

## 2019-06-22 NOTE — Progress Notes (Signed)
Phone call to Pt to inquire about interest for in person CR program. Pt stated he would not like to enroll at this time due to [place] 19. Pt stated he would call back when he is ready to proceed with in person CR. Pt will continue using the virtual CR program and exercising at home.

## 2019-06-28 ENCOUNTER — Encounter (INDEPENDENT_AMBULATORY_CARE_PROVIDER_SITE_OTHER): Payer: Medicare Other | Admitting: Ophthalmology

## 2019-06-28 ENCOUNTER — Other Ambulatory Visit: Payer: Self-pay | Admitting: Family Medicine

## 2019-06-28 DIAGNOSIS — H43813 Vitreous degeneration, bilateral: Secondary | ICD-10-CM

## 2019-06-28 DIAGNOSIS — H353132 Nonexudative age-related macular degeneration, bilateral, intermediate dry stage: Secondary | ICD-10-CM | POA: Diagnosis not present

## 2019-06-28 DIAGNOSIS — I10 Essential (primary) hypertension: Secondary | ICD-10-CM | POA: Diagnosis not present

## 2019-06-28 DIAGNOSIS — H35712 Central serous chorioretinopathy, left eye: Secondary | ICD-10-CM | POA: Diagnosis not present

## 2019-06-28 DIAGNOSIS — H35033 Hypertensive retinopathy, bilateral: Secondary | ICD-10-CM | POA: Diagnosis not present

## 2019-06-28 MED ORDER — CITALOPRAM HYDROBROMIDE 10 MG PO TABS: 10.0000 mg | ORAL_TABLET | Freq: Every day | ORAL | 4 refills | Status: DC

## 2019-07-01 ENCOUNTER — Encounter (HOSPITAL_COMMUNITY): Payer: Medicare Other

## 2019-07-02 ENCOUNTER — Other Ambulatory Visit: Payer: Self-pay

## 2019-07-02 ENCOUNTER — Inpatient Hospital Stay (HOSPITAL_COMMUNITY)
Admission: RE | Admit: 2019-07-02 | Discharge: 2019-07-02 | Disposition: A | Discharge status: Home / Self Care | Payer: Medicare Other | Source: Ambulatory Visit

## 2019-07-02 NOTE — Progress Notes (Signed)
Nutrition Note: Virtual Visit  Spoke with pt for virtual cardiac rehab. Pt is  exercising at home about 5-7 days/week for 20-40 minutes/session. Pt is  reporting exercise in virtual app. Pt does not report having any symptoms.   Diet recall reviewed. Nutrition goals reviewed.Brandon [name] started reading labels since our last visit. He feels comfortable with this. He has estimated his sodium intake and feels it typically is less than 2000 mg/day. He was able to discuss the new foods he has been trying. He is making great efforts to add in fiber rich foods including beans, edemame, oats, whole grain breads, whole wheat and lentil pasta. He is cooking new foods at home. He has been successful in decreasing sugary beverages. He feels he has decreased the amount sodas he drinks in totality while also switching to diet sodas. Activity: Try new soup recipes to incorporate beans and lentils Handouts: soup recipes Reinforced education and goals with summary email.  Will continue to monitor pt with monthly phone calls for virtual cardiac rehab.   [name], MS, RDN, LDN

## 2019-07-07 NOTE — Progress Notes (Signed)
Advanced Heart Failure Clinic Note   Referring Physician: PCP: [name], MD PCP-Cardiologist: [name], MD  [facility]: Dr. [name]   HPI: 64 y/o smoker (originally from [place], [place]) with h/o CAD s/p previous MI, HTN, HL admitted 04/27/19 with several days of stuttering CP. Initial ECG showed anterior ST elevation. HsTrop 476 -> >27k. Taken to cath lab which showed chronically occluded RCA (L>>R collaterals) and thrombotic occlusion of proximal LAD. Underwent PCI of LAD. Echo w/ reduced LVEF 30-35% w/ apical aneurysm. RV ok. Post cath, he required milrinone for cardiogenic shock. Diuresed w/ IV Lasix. Milrinone ultimately weaned off and Co-ox remained stable off milrinone at 69%. GDMT limited by hypotension. He required midodrine 5 mg tid. Was continued on eplerenone 12.5 mg daily (on prior to admit), digoxin added. Discharged home on lasix 20 mg daily. No BP room for ARB nor  blocker. For his CAD, he was placed on DAPT w/ ASA and Brilinta + high intensity statin, Lipitor 40 mg. He was discharged home on 05/04/19.  Today he returns for HF follow up.Last visit midodrine was stopped and losartan was added at bed time. Overall feeling fine. Denies SOB/PND/Orthopnea. No chest pain. No bleeding. Walking 40 minutes a day. BP 100-110.  Continue cardiac rehab.  Appetite ok. No fever or chills. Weight at home 195-196  pounds. Taking all medications. Usually takes lasix twice a week.  No longer smoking. Drinking 2 beers a week.   ECHO 04/27/19 EF 30-35% RV norma.  Moderate HK LV. Grade IDD.   Past Medical History:  Diagnosis Date  . Ankylosing spondylitis ([place])   . Arthritis   . BENIGN PROSTATIC HYPERTROPHY 06/07/2008  . COLONIC POLYPS, HX OF 06/07/2008  . H/O hiatal hernia   . Heart murmur   . HYPERLIPIDEMIA 06/07/2008  . HYPERTENSION 06/07/2008  . Myocardial infarction [facility]) 2005   NSTEMI, s/p LAD stent  . NEPHROLITHIASIS, HX OF 06/07/2008  . STEMI (ST elevation myocardial infarction)  ([place]) 04/27/2019    Current Outpatient Medications  Medication Sig Dispense Refill  . aspirin 81 MG chewable tablet Chew 1 tablet (81 mg total) by mouth daily. 30 tablet 3  . atorvastatin (LIPITOR) 80 MG tablet TAKE 1 TABLET BY MOUTH ONCE DAILY AT 6PM. 90 tablet 3  . carvedilol (COREG) 3.125 MG tablet Take 1 tablet (3.125 mg total) by mouth 2 (two) times daily. 180 tablet 3  . citalopram (CELEXA) 10 MG tablet Take 1 tablet (10 mg total) by mouth daily. 30 tablet 4  . digoxin (LANOXIN) 0.125 MG tablet Take 1 tablet (0.125 mg total) by mouth daily. 30 tablet 3  . eplerenone (INSPRA) 25 MG tablet Take 1 tablet (25 mg total) by mouth daily. 90 tablet 3  . ezetimibe (ZETIA) 10 MG tablet Take 1 tablet (10 mg total) by mouth daily. 90 tablet 3  . furosemide (LASIX) 20 MG tablet Take 2 tablets (40 mg total) by mouth as needed. 180 tablet 0  . inFLIXimab in sodium chloride 0.9 % Inject into the vein every 6 (six) weeks.    [name] losartan (COZAAR) 25 MG tablet Take 0.5 tablets (12.5 mg total) by mouth daily. 45 tablet 3  . prasugrel (EFFIENT) 10 MG TABS tablet Take 1 tablet (10 mg total) by mouth daily. 90 tablet 3  . tamsulosin (FLOMAX) 0.4 MG CAPS capsule TAKE 1 CAPSULE(0.4 MG) BY MOUTH DAILY 90 capsule 1   No current facility-administered medications for this encounter.    No Known Allergies    Social  History   Socioeconomic History  . Marital status: Single    Spouse name: Not on file  . Number of children: Not on file  . Years of education: Not on file  . Highest education level: Not on file  Occupational History  . 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 Smoking status: Current Every Day Smoker    Packs/day: 0.50    Years: 40.00    Pack years: 20.00    Types: Cigarettes  . Smokeless tobacco: Never Used  Substance and Sexual Activity  . Alcohol use: Yes    Comment: daily  . Drug use: No  . Sexual activity: Not on file  Other Topics Concern  . Not on file    Social History Narrative  . Not on file   Social Determinants of Health   Financial Resource Strain:   . Difficulty of Paying Living Expenses: Not on file  Food Insecurity: No Food Insecurity  . Worried About Charity fundraiser in the Last Year: Never true  . Ran Out of Food in the Last Year: Never true  Transportation Needs: No Transportation Needs  . Lack of Transportation (Medical): No  . Lack of Transportation (Non-Medical): No  Physical Activity: Sufficiently Active  . Days of Exercise per Week: 5 days  . Minutes of Exercise per Session: 40 min  Stress:   . Feeling of Stress : Not on file  Social Connections:   . Frequency of Communication with Friends and Family: Not on file  . Frequency of Social Gatherings with Friends and Family: Not on file  . Attends Religious Services: Not on file  . Active Member of Clubs or Organizations: Not on file  . Attends Archivist Meetings: Not on file  . Marital Status: Not on file  Intimate Partner Violence:   . Fear of Current or Ex-Partner: Not on file  . Emotionally Abused: Not on file  . Physically Abused: Not on file  . Sexually Abused: Not on file      Family History  Problem Relation Age of Onset  . Depression Father   . Arthritis Sister        RA  . Heart failure Mother   . Other Neg Hx   . Colon cancer Neg Hx   . Esophageal cancer Neg Hx   . Rectal cancer Neg Hx   . Stomach cancer Neg Hx     Vitals:   07/08/19 0942  BP: 138/80  Pulse: 87  SpO2: 97%  Weight: 88.8 kg (195 lb 12.8 oz)   Wt Readings from Last 3 Encounters:  07/08/19 88.8 kg (195 lb 12.8 oz)  06/16/19 88.9 kg (196 lb)  06/10/19 88.4 kg (194 lb 12.8 oz)   PHYSICAL EXAM: General:  Well appearing. No resp difficulty HEENT: normal Neck: supple. no JVD. Carotids 2+ bilat; no bruits. No lymphadenopathy or thryomegaly appreciated. Cor: PMI nondisplaced. Regular rate & rhythm. No rubs, gallops or murmurs. Lungs: clear Abdomen: soft,  nontender, nondistended. No hepatosplenomegaly. No bruits or masses. Good bowel sounds. Extremities: no cyanosis, clubbing, rash, edema Neuro: alert & orientedx3, cranial nerves grossly intact. moves all 4 extremities w/o difficulty. Affect pleasant  ASSESSMENT & PLAN:  1. CAD: s/p anterior STEMI 12/20. LHC showed chronically occluded RCA (with L>>R collaterals) and thrombotic occlusion of proximal LAD. Underwent PCI of LAD. - No chest pain.  - continue DAPT w/ ASA + Brilinta for a minimum of 12 months -  continue high intensity statin. LDL goal < 70 (79 mg/dL 12/20).  -Continue Coreg 3.125 mg bid - Continue cardiac rehab.   2.  Chronic systolic heart failure/ischemic cardiomyopathy:  04/27/19 ECHO- EF post anterior MI 30 to 35%. NYHA II. Volume status stable. Continue lasix needed.   -Continue eplerenone 12.5 mg daily -Continue digoxin 0.125 mg daily.   - Continue coreg to 3.125 mg twice a day -Stop losartan. Start entresto 24-26 mg twice a day. Start at bed time.  - If EF remains less than 35%, will refer to EP for consideration for ICD. -Set up ECHO in 4  weeks.   3.  Tobacco abuse: Former smoker.  Recently quit after myocardial infarction.   Quit smoking 45 days ago   4.  Hyperlipidemia: Recent LDL 7 9 mg/dL.  Goal in the setting of known coronary disease and recent myocardial infarction is less than 70 mg/dL.  Reports full compliance with atorvastatin 80 mg and tolerating well without side effects. -Repeat fasting lipid panel hepatic function today   5. ETOH  Quit drinking alcohol 45 days ago.  6. Aortic Stenosis Mild-mod on ECHO   Discussed medication changes. Check BMET in 7 days. Greater than 50% of the (total minutes 25) visit spent in counseling/coordination of care regarding the above.   Follow up in 4 weeks with an ECHO and Dr Donnamae Jude, NP 07/08/19

## 2019-07-08 ENCOUNTER — Ambulatory Visit (HOSPITAL_COMMUNITY)
Admission: RE | Admit: 2019-07-08 | Discharge: 2019-07-08 | Disposition: A | Discharge status: Home / Self Care | Payer: Medicare Other | Source: Ambulatory Visit | Attending: Adult Health | Admitting: Adult Health

## 2019-07-08 ENCOUNTER — Encounter (HOSPITAL_COMMUNITY): Payer: Self-pay

## 2019-07-08 ENCOUNTER — Other Ambulatory Visit: Payer: Self-pay

## 2019-07-08 ENCOUNTER — Telehealth (HOSPITAL_COMMUNITY): Payer: Self-pay

## 2019-07-08 ENCOUNTER — Encounter (HOSPITAL_COMMUNITY)
Admission: RE | Admit: 2019-07-08 | Discharge: 2019-07-08 | Disposition: A | Discharge status: Home / Self Care | Payer: Self-pay | Source: Ambulatory Visit | Attending: Cardiology | Admitting: Cardiology

## 2019-07-08 VITALS — BP 138/80 | HR 87 | Wt 195.8 lb

## 2019-07-08 DIAGNOSIS — I252 Old myocardial infarction: Secondary | ICD-10-CM | POA: Diagnosis not present

## 2019-07-08 DIAGNOSIS — I219 Acute myocardial infarction, unspecified: Secondary | ICD-10-CM | POA: Insufficient documentation

## 2019-07-08 DIAGNOSIS — Z79899 Other long term (current) drug therapy: Secondary | ICD-10-CM | POA: Insufficient documentation

## 2019-07-08 DIAGNOSIS — E785 Hyperlipidemia, unspecified: Secondary | ICD-10-CM | POA: Diagnosis not present

## 2019-07-08 DIAGNOSIS — Z87891 Personal history of nicotine dependence: Secondary | ICD-10-CM | POA: Insufficient documentation

## 2019-07-08 DIAGNOSIS — I350 Nonrheumatic aortic (valve) stenosis: Secondary | ICD-10-CM | POA: Diagnosis not present

## 2019-07-08 DIAGNOSIS — I5022 Chronic systolic (congestive) heart failure: Secondary | ICD-10-CM

## 2019-07-08 DIAGNOSIS — Z8249 Family history of ischemic heart disease and other diseases of the circulatory system: Secondary | ICD-10-CM | POA: Diagnosis not present

## 2019-07-08 DIAGNOSIS — M1990 Unspecified osteoarthritis, unspecified site: Secondary | ICD-10-CM | POA: Insufficient documentation

## 2019-07-08 DIAGNOSIS — Z7982 Long term (current) use of aspirin: Secondary | ICD-10-CM | POA: Insufficient documentation

## 2019-07-08 DIAGNOSIS — I255 Ischemic cardiomyopathy: Secondary | ICD-10-CM | POA: Diagnosis not present

## 2019-07-08 DIAGNOSIS — I2510 Atherosclerotic heart disease of native coronary artery without angina pectoris: Secondary | ICD-10-CM

## 2019-07-08 DIAGNOSIS — Z955 Presence of coronary angioplasty implant and graft: Secondary | ICD-10-CM | POA: Insufficient documentation

## 2019-07-08 DIAGNOSIS — Z9861 Coronary angioplasty status: Secondary | ICD-10-CM

## 2019-07-08 DIAGNOSIS — I110 Hypertensive heart disease with heart failure: Secondary | ICD-10-CM | POA: Diagnosis not present

## 2019-07-08 MED ORDER — SACUBITRIL-VALSARTAN 24-26 MG PO TABS: 1.0000 | ORAL_TABLET | Freq: Two times a day (BID) | ORAL | 3 refills | Status: DC

## 2019-07-08 NOTE — Patient Instructions (Signed)
Lab work will need to be done in 7 days.   STOP Losartan  START Entresto 24-26mg  tab two times daily. Please start your first dose tonight.  Please keep follow up appointment with the [facility].  At the [facility], you and your health needs are our priority. As part of our continuing mission to provide you with exceptional heart care, we have created designated Provider Care Teams. These Care Teams include your primary Cardiologist (physician) and Advanced Practice Providers (APPs- Physician Assistants and Nurse Practitioners) who all work together to provide you with the care you need, when you need it.   You may see any of the following providers on your designated Care Team at your next follow up: [name] Dr Glori Bickers . Dr Loralie Champagne . Darrick Grinder, NP . Lyda Jester, PA . Audry Riles, PharmD   Please be sure to bring in all your medications bottles to every appointment.

## 2019-07-08 NOTE — Progress Notes (Signed)
Virtual Cardiac Rehab Note:  Successful telephone encounter to Mr. Andrey Campanile to follow up on today's HF clinic visit and VCR plan of care. Mr. Pio is doing very well following his CR plan of care. He is exercising daily, checking vitals as indicated, compliant with all medications, and following a heart healthy diet. He remains free from alcohol and smoking. Indicates medication change to day. HF Clinic notes reviewed. Losartan discontinued and Entresto prescribed. Discussed s/s of hypotension with patient and encouraged patient to change positions slowly. Patient verbalized understanding. He will take his first dose of Entresto tonight.  Plan: Will follow up with patient in 1 week  Zell Doucette E. Rollene Rotunda RN, BSN [place]. [facility]  Cardiac and Pulmonary Rehabilitation [place] Direct: [phone]

## 2019-07-08 NOTE — Telephone Encounter (Signed)
Virtual Cardiac Rehab Note:  Successful telephone encounter to Mr. Barwick per his request to discuss cardiac rehab plan of care. Mr. Hymas appreciative of call however can't speak at this time secondary to leaving for medical appointment. States he will be available this afternoon to take call.  Plan: Will follow up later today  Nasira Janusz E. Rollene Rotunda RN, BSN [place]. [facility]  Cardiac and Pulmonary Rehabilitation [place] Direct: [phone]

## 2019-07-09 ENCOUNTER — Encounter (HOSPITAL_COMMUNITY)
Admission: RE | Admit: 2019-07-09 | Discharge: 2019-07-09 | Disposition: A | Discharge status: Home / Self Care | Payer: Self-pay | Source: Ambulatory Visit | Attending: Cardiology | Admitting: Cardiology

## 2019-07-09 DIAGNOSIS — Z955 Presence of coronary angioplasty implant and graft: Secondary | ICD-10-CM

## 2019-07-09 DIAGNOSIS — I2102 ST elevation (STEMI) myocardial infarction involving left anterior descending coronary artery: Secondary | ICD-10-CM

## 2019-07-09 NOTE — Progress Notes (Signed)
Virtual Cardiac Rehab Note:  Successful telephone encounter to [name] to follow up on BP documented this am in the [facility] VCR app. Mr. Clutter records his BP prior to medications as 95/70. This is reflective of his starting dose of Entresto last evening. Mr. Holzmann is asymptomatic. He is encouraged to monitor his BP intermittently today and to assess for s/s of hypotension. RN reinforces importance of changing positions slowly to prevent dizziness and falls. Patient verbalizes understanding. He will document all BPs in the [facility] VCR app for clinician review.  Plan: Will continue to follow patient closely  Marcena Dias E. Rollene Rotunda RN, BSN [place]. [facility]  Cardiac and Pulmonary Rehabilitation [place] Direct: [phone]

## 2019-07-13 ENCOUNTER — Ambulatory Visit: Payer: Medicare Other | Attending: Internal Medicine

## 2019-07-13 DIAGNOSIS — Z23 Encounter for immunization: Secondary | ICD-10-CM

## 2019-07-13 NOTE — Progress Notes (Signed)
   Covid-19 Vaccination Clinic  Name:  Brandon [name]    MRN: [id number] DOB: Apr 28, 1956  07/13/2019  [name] was observed post Covid-19 immunization for 15 minutes without incident. He was provided with Vaccine Information Sheet and instruction to access the V-Safe system.   [name] was instructed to call 911 with any severe reactions post vaccine: [name] Difficulty breathing  . Swelling of face and throat  . A fast heartbeat  . A bad rash all over body  . Dizziness and weakness   Immunizations Administered    Name Date Dose VIS Date Route   Pfizer COVID-19 Vaccine 07/13/2019  8:28 AM 0.3 mL 04/23/2019 Intramuscular   Manufacturer: [place]   Lot: [id number]   [place]: [id number]

## 2019-07-15 ENCOUNTER — Ambulatory Visit (HOSPITAL_COMMUNITY)
Admission: RE | Admit: 2019-07-15 | Discharge: 2019-07-15 | Disposition: A | Discharge status: Home / Self Care | Payer: Medicare Other | Source: Ambulatory Visit | Attending: Internal Medicine | Admitting: Internal Medicine

## 2019-07-15 ENCOUNTER — Other Ambulatory Visit: Payer: Self-pay

## 2019-07-15 DIAGNOSIS — I5022 Chronic systolic (congestive) heart failure: Secondary | ICD-10-CM | POA: Diagnosis not present

## 2019-07-15 LAB — BASIC METABOLIC PANEL
Anion gap: 7 (ref 5–15)
BUN: 12 mg/dL (ref 8–23)
CO2: 27 mmol/L (ref 22–32)
Calcium: 9.4 mg/dL (ref 8.9–10.3)
Chloride: 104 mmol/L (ref 98–111)
Creatinine, Ser: 0.89 mg/dL (ref 0.61–1.24)
GFR calc Af Amer: >60 mL/min (ref >60)
GFR calc non Af Amer: >60 mL/min (ref >60)
Glucose, Bld: 97 mg/dL (ref 70–99)
Potassium: 4.1 mmol/L (ref 3.5–5.1)
Sodium: 138 mmol/L (ref 135–145)

## 2019-07-16 NOTE — Telephone Encounter (Signed)
Nothing further needed 

## 2019-07-18 ENCOUNTER — Other Ambulatory Visit: Payer: Self-pay | Admitting: Family Medicine

## 2019-07-20 ENCOUNTER — Other Ambulatory Visit: Payer: Self-pay | Admitting: Family Medicine

## 2019-07-27 ENCOUNTER — Encounter (HOSPITAL_COMMUNITY)
Admission: RE | Admit: 2019-07-27 | Discharge: 2019-07-27 | Disposition: A | Discharge status: Home / Self Care | Payer: Self-pay | Source: Ambulatory Visit | Attending: Cardiology | Admitting: Cardiology

## 2019-07-27 ENCOUNTER — Other Ambulatory Visit: Payer: Self-pay

## 2019-07-27 DIAGNOSIS — Z955 Presence of coronary angioplasty implant and graft: Secondary | ICD-10-CM | POA: Insufficient documentation

## 2019-07-27 DIAGNOSIS — I2102 ST elevation (STEMI) myocardial infarction involving left anterior descending coronary artery: Secondary | ICD-10-CM | POA: Insufficient documentation

## 2019-07-27 NOTE — Progress Notes (Signed)
Virtual Cardiac Rehab Note:  Successful telephone encounter to Brandon [name] to follow up on Virtual Cardiac Rehab plan of care and home exercise. Mr Oscar was recently started on Entresto and has been documenting SPB pressure in the 80s-90s in the [facility] App. States he is asymptomatic and questions if his BP monitor (given to him by neighbor) was accurate. He has recently purchased a new home BP monitor and states his pressure are a little higher with his new monitor. Reports SBP 111/73 yesterday. Mr. Springsteen has requested to present to CR for a face to face visit to have manual BP checked and to confirm the accuracy of his new BP cuff. Reinforced importance of changing positions slowly to prevent orthostatic hypotension. He continues to walk 20 min twice daily and states his exercise is getting easier. He sees an improvement in his stamina and strength.  Plan: Face to face with CR RN scheduled for 07/28/19 at 4:00  Jaleisa Brose E. Rollene Rotunda RN, BSN [place]. [facility]  Cardiac and Pulmonary Rehabilitation Phone: [phone] Fax: [phone]

## 2019-07-28 ENCOUNTER — Encounter (HOSPITAL_COMMUNITY)
Admission: RE | Admit: 2019-07-28 | Discharge: 2019-07-28 | Disposition: A | Discharge status: Home / Self Care | Payer: Medicare Other | Source: Ambulatory Visit | Attending: Cardiology | Admitting: Cardiology

## 2019-07-28 NOTE — Progress Notes (Signed)
Virtual Cardiac Rehab Note:  Mr. Watlington presented to Cardiac Rehab for a one on one appointment with Virtual CR RN. Mr. Tabbert is concerned that his old BP cuff was not accurate. He purchased a new cuff and requested cuff readings be compared to manual BP obtained by RN (see below). Mr. Wickens is pleased with consistency and accuracy of new BP monitor as compared to manual pressures obtained today. All questions regarding BP were answered. Patient continues to do very well utilizing the [facility] app and following his VCR plan of care.  Manual BP R Arm  96/60 Manual BP L Arm 90/60 Patients Automatic Cuff R Arm 114/74 Patients Automatic Cuff L Arm 114/76  Plan: Will continue to follow as needed  Bunnie Rehberg E. Rollene Rotunda RN, BSN [place]. [facility]  Cardiac and Pulmonary Rehabilitation Phone: [phone] Fax: [phone]

## 2019-07-29 ENCOUNTER — Other Ambulatory Visit (HOSPITAL_COMMUNITY): Payer: Self-pay | Admitting: *Deleted

## 2019-07-29 DIAGNOSIS — I5022 Chronic systolic (congestive) heart failure: Secondary | ICD-10-CM

## 2019-07-30 ENCOUNTER — Encounter (HOSPITAL_COMMUNITY)
Admission: RE | Admit: 2019-07-30 | Discharge: 2019-07-30 | Disposition: A | Discharge status: Home / Self Care | Payer: Medicare Other | Source: Ambulatory Visit | Attending: Cardiology | Admitting: Cardiology

## 2019-07-30 ENCOUNTER — Other Ambulatory Visit: Payer: Self-pay

## 2019-07-30 NOTE — Progress Notes (Signed)
Virtual Cardiac Rehab Nutrition Note  Reviewed nutrition goals and diet recall. Pt feels he is continuing to follow a heart healthy diet and limiting sodium. He has been incorporating beans. He has eliminated sugary beverages.  He had questions about salads and salad dressings. Provided him with ideas on how to create low sodium dressings. Pt reports satisfaction with suggestions and feels he has a few things to work on to better his diet.  Will continue to monitor pt with phone calls during his time in virtual cardiac rehab.  [name], MS, RDN, LDN

## 2019-08-09 ENCOUNTER — Encounter (HOSPITAL_COMMUNITY): Payer: Self-pay | Admitting: Internal Medicine

## 2019-08-09 ENCOUNTER — Ambulatory Visit (HOSPITAL_BASED_OUTPATIENT_CLINIC_OR_DEPARTMENT_OTHER)
Admission: RE | Admit: 2019-08-09 | Discharge: 2019-08-09 | Disposition: A | Discharge status: Home / Self Care | Payer: Medicare Other | Source: Ambulatory Visit | Attending: Internal Medicine | Admitting: Internal Medicine

## 2019-08-09 ENCOUNTER — Other Ambulatory Visit: Payer: Self-pay

## 2019-08-09 ENCOUNTER — Ambulatory Visit (HOSPITAL_COMMUNITY)
Admission: RE | Admit: 2019-08-09 | Discharge: 2019-08-09 | Disposition: A | Discharge status: Home / Self Care | Payer: Medicare Other | Source: Ambulatory Visit | Attending: Family Medicine | Admitting: Family Medicine

## 2019-08-09 VITALS — BP 110/72 | HR 82 | Wt 195.0 lb

## 2019-08-09 DIAGNOSIS — I350 Nonrheumatic aortic (valve) stenosis: Secondary | ICD-10-CM

## 2019-08-09 DIAGNOSIS — Z79899 Other long term (current) drug therapy: Secondary | ICD-10-CM | POA: Diagnosis not present

## 2019-08-09 DIAGNOSIS — Z8249 Family history of ischemic heart disease and other diseases of the circulatory system: Secondary | ICD-10-CM | POA: Insufficient documentation

## 2019-08-09 DIAGNOSIS — Z955 Presence of coronary angioplasty implant and graft: Secondary | ICD-10-CM | POA: Diagnosis not present

## 2019-08-09 DIAGNOSIS — I252 Old myocardial infarction: Secondary | ICD-10-CM | POA: Insufficient documentation

## 2019-08-09 DIAGNOSIS — Z7982 Long term (current) use of aspirin: Secondary | ICD-10-CM | POA: Insufficient documentation

## 2019-08-09 DIAGNOSIS — M1990 Unspecified osteoarthritis, unspecified site: Secondary | ICD-10-CM | POA: Diagnosis not present

## 2019-08-09 DIAGNOSIS — I255 Ischemic cardiomyopathy: Secondary | ICD-10-CM | POA: Insufficient documentation

## 2019-08-09 DIAGNOSIS — Z7901 Long term (current) use of anticoagulants: Secondary | ICD-10-CM | POA: Diagnosis not present

## 2019-08-09 DIAGNOSIS — I5022 Chronic systolic (congestive) heart failure: Secondary | ICD-10-CM

## 2019-08-09 DIAGNOSIS — I110 Hypertensive heart disease with heart failure: Secondary | ICD-10-CM | POA: Diagnosis not present

## 2019-08-09 DIAGNOSIS — Z9861 Coronary angioplasty status: Secondary | ICD-10-CM

## 2019-08-09 DIAGNOSIS — E785 Hyperlipidemia, unspecified: Secondary | ICD-10-CM | POA: Diagnosis not present

## 2019-08-09 DIAGNOSIS — Z87891 Personal history of nicotine dependence: Secondary | ICD-10-CM | POA: Diagnosis not present

## 2019-08-09 DIAGNOSIS — I2510 Atherosclerotic heart disease of native coronary artery without angina pectoris: Secondary | ICD-10-CM | POA: Insufficient documentation

## 2019-08-09 LAB — CBC
HCT: 48.7 % (ref 39.0–52.0)
Hemoglobin: 15.7 g/dL (ref 13.0–17.0)
MCH: 29 pg (ref 26.0–34.0)
MCHC: 32.2 g/dL (ref 30.0–36.0)
MCV: 89.9 fL (ref 80.0–100.0)
Platelets: 199 10*3/uL (ref 150–400)
RBC: 5.42 MIL/uL (ref 4.22–5.81)
RDW: 13.3 % (ref 11.5–15.5)
WBC: 7.2 10*3/uL (ref 4.0–10.5)
nRBC: 0 % (ref 0.0–0.2)

## 2019-08-09 LAB — DIGOXIN LEVEL: Digoxin Level: 0.5 ng/mL — ABNORMAL LOW (ref 0.8–2.0)

## 2019-08-09 NOTE — Progress Notes (Signed)
Echocardiogram 2D Echocardiogram has been performed.  Brandon [name] 08/09/2019, 11:49 AM

## 2019-08-09 NOTE — Progress Notes (Signed)
Advanced Heart Failure Clinic Note   Referring Physician: PCP: [name], MD PCP-Cardiologist: [name], MD  [facility]: Dr. [name]   HPI: 64 y/o smoker (originally from [place], [place]) with h/o CAD s/p previous MI, HTN, HL admitted 04/27/19 with several days of stuttering CP. Initial ECG showed anterior ST elevation. HsTrop 476 -> >27k. Taken to cath lab which showed chronically occluded RCA (L>>R collaterals) and thrombotic occlusion of proximal LAD. Underwent PCI of LAD. Echo w/ reduced LVEF 30-35% w/ apical aneurysm. RV ok. Post cath, he required milrinone for cardiogenic shock. Diuresed w/ IV Lasix. Milrinone ultimately weaned off and Co-ox remained stable off milrinone at 69%. GDMT limited by hypotension. He required midodrine 5 mg tid. Was continued on eplerenone 12.5 mg daily (on prior to admit), digoxin added. Discharged home on lasix 20 mg daily. No BP room for ARB nor  blocker. For his CAD, he was placed on DAPT w/ ASA and Brilinta + high intensity statin, Lipitor 40 mg. He was discharged home on 05/04/19.  At subsequent visits, his HF regimen was titrated. On GDMT. Now on Entresto, eplerenone, coreg and digoxin. Has tolerated meds well.   Returns to clinic today for f/u. Overall doing well. Walks for exercise. Able to walk 20 min at a time w/o exertional dyspnea. Wt has been stable. No LEE. Denies orthopnea/PND. No chest pain. BP well controlled. 110/72. HR controlled 82 bpm. Has quit smoking and drinking. Reports full compliance w/ meds. No side effects. Echo repeated today to assess LVEF. Interpretation pending.    ECHO 04/27/19 EF 30-35% RV norma.  Moderate HK LV. Grade IDD.   Past Medical History:  Diagnosis Date  . Ankylosing spondylitis ([place])   . Arthritis   . BENIGN PROSTATIC HYPERTROPHY 06/07/2008  . COLONIC POLYPS, HX OF 06/07/2008  . H/O hiatal hernia   . Heart murmur   . HYPERLIPIDEMIA 06/07/2008  . HYPERTENSION 06/07/2008  . Myocardial infarction [facility]) 2005    NSTEMI, s/p LAD stent  . NEPHROLITHIASIS, HX OF 06/07/2008  . STEMI (ST elevation myocardial infarction) ([place]) 04/27/2019    Current Outpatient Medications  Medication Sig Dispense Refill  . aspirin 81 MG chewable tablet Chew 1 tablet (81 mg total) by mouth daily. 30 tablet 3  . atorvastatin (LIPITOR) 80 MG tablet TAKE 1 TABLET BY MOUTH ONCE DAILY AT 6PM. 90 tablet 3  . carvedilol (COREG) 3.125 MG tablet Take 1 tablet (3.125 mg total) by mouth 2 (two) times daily. 180 tablet 3  . digoxin (LANOXIN) 0.125 MG tablet Take 1 tablet (0.125 mg total) by mouth daily. 30 tablet 3  . eplerenone (INSPRA) 25 MG tablet Take 1 tablet (25 mg total) by mouth daily. 90 tablet 3  . ezetimibe (ZETIA) 10 MG tablet Take 1 tablet (10 mg total) by mouth daily. 90 tablet 3  . furosemide (LASIX) 20 MG tablet Take 2 tablets (40 mg total) by mouth as needed. 180 tablet 0  . inFLIXimab in sodium chloride 0.9 % Inject into the vein every 6 (six) weeks.    . prasugrel (EFFIENT) 10 MG TABS tablet Take 1 tablet (10 mg total) by mouth daily. 90 tablet 3  . sacubitril-valsartan (ENTRESTO) 24-26 MG Take 1 tablet by mouth 2 (two) times daily. 60 tablet 3  . tamsulosin (FLOMAX) 0.4 MG CAPS capsule TAKE 1 CAPSULE(0.4 MG) BY MOUTH DAILY 90 capsule 1   No current facility-administered medications for this encounter.    No Known Allergies    Social History  Socioeconomic History  . Marital status: Single    Spouse name: Not on file  . Number of children: Not on file  . Years of education: Not on file  . Highest education level: Not on file  Occupational History  . 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 Smoking status: Former Smoker    Packs/day: 0.50    Years: 40.00    Pack years: 20.00    Types: Cigarettes    Quit date: 07/26/2019    Years since quitting: 0.0  . Smokeless tobacco: Never Used  Substance and Sexual Activity  . Alcohol use: Yes    Comment: daily  . Drug use: No  .  Sexual activity: Not on file  Other Topics Concern  . Not on file  Social History Narrative  . Not on file   Social Determinants of Health   Financial Resource Strain:   . Difficulty of Paying Living Expenses:   Food Insecurity: No Food Insecurity  . Worried About Charity fundraiser in the Last Year: Never true  . Ran Out of Food in the Last Year: Never true  Transportation Needs: No Transportation Needs  . Lack of Transportation (Medical): No  . Lack of Transportation (Non-Medical): No  Physical Activity: Sufficiently Active  . Days of Exercise per Week: 5 days  . Minutes of Exercise per Session: 40 min  Stress:   . Feeling of Stress :   Social Connections:   . Frequency of Communication with Friends and Family:   . Frequency of Social Gatherings with Friends and Family:   . Attends Religious Services:   . Active Member of Clubs or Organizations:   . Attends Archivist Meetings:   [name] Marital Status:   Intimate Partner Violence:   . Fear of Current or Ex-Partner:   . Emotionally Abused:   [name] Physically Abused:   . Sexually Abused:       Family History  Problem Relation Age of Onset  . Depression Father   . Arthritis Sister        RA  . Heart failure Mother   . Other Neg Hx   . Colon cancer Neg Hx   . Esophageal cancer Neg Hx   . Rectal cancer Neg Hx   . Stomach cancer Neg Hx     Vitals:   08/09/19 1151  BP: 110/72  Pulse: 82  SpO2: 97%  Weight: 88.5 kg (195 lb)   Wt Readings from Last 3 Encounters:  08/09/19 88.5 kg (195 lb)  07/08/19 88.8 kg (195 lb 12.8 oz)  06/16/19 88.9 kg (196 lb)   PHYSICAL EXAM: General:  Well appearing WM. No respiratory difficulty HEENT: normal Neck: supple. no JVD. Carotids 2+ bilat; no bruits. No lymphadenopathy or thyromegaly appreciated. Cor: PMI nondisplaced. Regular rate & rhythm. No rubs, gallops or murmurs. Lungs: clear Abdomen: soft, nontender, nondistended. No hepatosplenomegaly. No bruits or masses. Good  bowel sounds. Extremities: no cyanosis, clubbing, rash, edema Neuro: alert & oriented x 3, cranial nerves grossly intact. moves all 4 extremities w/o difficulty. Affect pleasant.   ASSESSMENT & PLAN:  1. CAD: s/p anterior STEMI 12/20. LHC showed chronically occluded RCA (with L>>R collaterals) and thrombotic occlusion of proximal LAD. Underwent PCI of LAD. - No further angina.   - continue DAPT w/ ASA + Effient for a minimum of 12 months - continue high intensity statin + Zetia. LDL goal < 70 (79 mg/dL 12/20).  - Continue  Coreg 3.125 mg bid - Continue cardiac rehab.   2.  Chronic systolic heart failure/ischemic cardiomyopathy:  04/27/19 ECHO- EF post anterior MI 30 to 35%. NYHA II. Volume status stable. Continue lasix PRN.   -Continue eplerenone 25 mg daily -Continue digoxin 0.125 mg daily.  Check dig level today -Continue coreg to 3.125 mg twice a day -Continue Entresto 24-26 mg twice a day.  -Check BMP today  -Echo repeated today. Interpretation pending. If EF remains less than 35%, will refer to EP for consideration for ICD.   3.  Tobacco abuse: Former smoker.  Recently quit after myocardial infarction.   - continues to refrain from tobacco use. He was congratulated on efforts.    4.  Hyperlipidemia: Recent LDL 7 9 mg/dL.  Goal in the setting of known coronary disease and recent myocardial infarction is less than 70 mg/dL.  Reports full compliance with atorvastatin 80 mg and tolerating well without side effects. -now followed by Dr. Debara Pickett in [facility] and 10 mg Zetia added  - Dr. Debara Pickett to repeat lipid panel at next visit   5. ETOH  - reports he has quit drinking  6. Aortic Stenosis -Mild-mod on ECHO    Brittainy Simmons, PA-C 08/09/19   Agree with above.   Overall doing well. NYHA II volume status looks good. I reviewed echo today personally EF still low 25%. I called him  To follow up and we discussed need for ICD. He is interested in pursuing. Will refer to EP.  Labs ok. Consider adding SGLT2i at next visit +/- increasing Entresto. Will refer to PharmD for med titration as BP tolerates    Glori Bickers, MD  2:34 PM

## 2019-08-09 NOTE — Patient Instructions (Signed)
Labs done today. We will contact you only if your labs are abnormal.  No medication changes were made. Please continue all current medications as prescribed.  Your physician recommends that you schedule a follow-up appointment in: 3 months  Do the following things EVERYDAY: 1) Weigh yourself in the morning before breakfast. Write it down and keep it in a log. 2) Take your medicines as prescribed 3) Eat low salt foods--Limit salt (sodium) to 2000 mg per day.  4) Stay as active as you can everyday 5) Limit all fluids for the day to less than 2 liters   At the [facility], you and your health needs are our priority. As part of our continuing mission to provide you with exceptional heart care, we have created designated Provider Care Teams. These Care Teams include your primary Cardiologist (physician) and Advanced Practice Providers (APPs- Physician Assistants and Nurse Practitioners) who all work together to provide you with the care you need, when you need it.   You may see any of the following providers on your designated Care Team at your next follow up: [name] Dr Glori Bickers . Dr Loralie Champagne . Darrick Grinder, NP . Lyda Jester, PA . Audry Riles, PharmD   Please be sure to bring in all your medications bottles to every appointment.

## 2019-08-10 ENCOUNTER — Encounter (HOSPITAL_COMMUNITY): Payer: Self-pay

## 2019-08-10 ENCOUNTER — Other Ambulatory Visit (HOSPITAL_COMMUNITY): Payer: Self-pay | Admitting: *Deleted

## 2019-08-10 DIAGNOSIS — I5022 Chronic systolic (congestive) heart failure: Secondary | ICD-10-CM

## 2019-08-11 ENCOUNTER — Encounter (HOSPITAL_COMMUNITY): Payer: Self-pay

## 2019-08-16 ENCOUNTER — Other Ambulatory Visit: Payer: Self-pay

## 2019-08-16 ENCOUNTER — Encounter: Payer: Self-pay | Admitting: Internal Medicine

## 2019-08-16 ENCOUNTER — Telehealth: Payer: Self-pay

## 2019-08-16 ENCOUNTER — Telehealth (INDEPENDENT_AMBULATORY_CARE_PROVIDER_SITE_OTHER): Payer: Medicare Other | Admitting: Internal Medicine

## 2019-08-16 VITALS — BP 109/70 | HR 88 | Ht 72.0 in | Wt 192.5 lb

## 2019-08-16 DIAGNOSIS — I255 Ischemic cardiomyopathy: Secondary | ICD-10-CM

## 2019-08-16 DIAGNOSIS — I5022 Chronic systolic (congestive) heart failure: Secondary | ICD-10-CM | POA: Diagnosis not present

## 2019-08-16 NOTE — Progress Notes (Signed)
Electrophysiology TeleHealth Note   Due to national recommendations of social distancing due to [place] 19, Audio/video telehealth visit is felt to be most appropriate for this patient at this time.  See MyChart message from today for patient consent regarding telehealth for [facility].   Date:  08/16/2019   ID:  Brandon [name], DOB 1955-12-06, MRN [id number]  Location: home Provider location: [place]  Evaluation Performed: New patient consult  PCP:  [name], MD  Cardiologist:  [name], MD  CHF: [place]  Chief Complaint:  CHF  History of Present Illness:    Brandon [name] is a 64 y.o. male who presents via audio/video conferencing for a telehealth visit today.   The patient is referred for new consultation regarding risk stratification of sudden death by Dr [name].   He has developed an ischemic CM (EF 25%) after large anterior MI 04/27/2019.  He underwent cath by Dr Irish Lack which showed acute mid occlusion of the LAD for which he underwent PCI.  He has chronic RCA occlusion with L--> R collaterals.  He has been treated with an optimal medical therapy but continues to have a reduced EF.  He has SOB with activity.  He also has fatigue.  He is walking with cardiac rehab and making some progress.   Today, he denies symptoms of palpitations, chest pain, lower extremity edema, claudication, dizziness, presyncope, syncope, bleeding, or neurologic sequela. The patient is tolerating medications without difficulties and is otherwise without complaint today.    Past Medical History:  Diagnosis Date  . Ankylosing spondylitis ([place])   . Arthritis   . BENIGN PROSTATIC HYPERTROPHY 06/07/2008  . COLONIC POLYPS, HX OF 06/07/2008  . H/O hiatal hernia   . HYPERLIPIDEMIA 06/07/2008  . HYPERTENSION 06/07/2008  . Myocardial infarction [facility]) 2005   NSTEMI, s/p LAD stent  . NEPHROLITHIASIS, HX OF 06/07/2008  . STEMI (ST elevation myocardial infarction) ([place]) 04/27/2019     Past Surgical History:  Procedure Laterality Date  . CORONARY ANGIOPLASTY WITH STENT PLACEMENT  2005   LAD stent, jailed diagonal  . CORONARY/GRAFT ACUTE MI REVASCULARIZATION N/A 04/27/2019   Procedure: Coronary/Graft Acute MI Revascularization;  Surgeon: [name], MD;  Location: [place] [facility];  Service: Cardiovascular;  Laterality: N/A;  . LAMINECTOMY     lumbar  . LEFT HEART CATH AND CORONARY ANGIOGRAPHY N/A 04/27/2019   Procedure: LEFT HEART CATH AND CORONARY ANGIOGRAPHY;  Surgeon: [name], MD;  Location: [place] [facility];  Service: Cardiovascular;  Laterality: N/A;  . LUMBAR FUSION  01/2012  . RIGHT HEART CATH N/A 04/27/2019   Procedure: RIGHT HEART CATH;  Surgeon: [name], Kardell M, MD;  Location: [place] [facility];  Service: Cardiovascular;  Laterality: N/A;  . TONSILLECTOMY    . warthin tumor removal Left 2014    Current Outpatient Medications  Medication Sig Dispense Refill  . aspirin 81 MG chewable tablet Chew 1 tablet (81 mg total) by mouth daily. 30 tablet 3  . atorvastatin (LIPITOR) 80 MG tablet TAKE 1 TABLET BY MOUTH ONCE DAILY AT 6PM. 90 tablet 3  . carvedilol (COREG) 3.125 MG tablet Take 1 tablet (3.125 mg total) by mouth 2 (two) times daily. 180 tablet 3  . digoxin (LANOXIN) 0.125 MG tablet Take 1 tablet (0.125 mg total) by mouth daily. 30 tablet 3  . eplerenone (INSPRA) 25 MG tablet Take 1 tablet (25 mg total) by mouth daily. 90 tablet 3  . ezetimibe (ZETIA) 10 MG  tablet Take 1 tablet (10 mg total) by mouth daily. 90 tablet 3  . furosemide (LASIX) 20 MG tablet Take 2 tablets (40 mg total) by mouth as needed. 180 tablet 0  . inFLIXimab in sodium chloride 0.9 % Inject into the vein every 6 (six) weeks.    . prasugrel (EFFIENT) 10 MG TABS tablet Take 1 tablet (10 mg total) by mouth daily. 90 tablet 3  . sacubitril-valsartan (ENTRESTO) 24-26 MG Take 1 tablet by mouth 2 (two) times daily. 60 tablet 3  . tamsulosin (FLOMAX) 0.4 MG CAPS  capsule TAKE 1 CAPSULE(0.4 MG) BY MOUTH DAILY 90 capsule 1   No current facility-administered medications for this visit.    Allergies:   Patient has no known allergies.   Social History:  The patient  reports that he quit smoking about 3 weeks ago. His smoking use included cigarettes. He has a 20.00 pack-year smoking history. He has never used smokeless tobacco. He reports current alcohol use. He reports that he does not use drugs.   Family History:  The patient's family history includes Arthritis in his sister; Depression in his father; Heart failure in his mother.    ROS:  Please see the history of present illness.   All other systems are personally reviewed and negative.    Exam:    Vital Signs:  BP 109/70   Pulse 88   Ht 6' (1.829 m)   Wt 192 lb 8 oz (87.3 kg)   BMI 26.11 kg/m    Well appearing, alert and conversant, regular work of breathing,  good skin color Eyes- anicteric, neuro- grossly intact, skin- no apparent rash or lesions or cyanosis, mouth- oral mucosa is pink   Labs/Other Tests and Data Reviewed:    Recent Labs: 04/27/2019: TSH 0.889 04/28/2019: Magnesium 1.9 06/10/2019: ALT 22 07/15/2019: BUN 12; Creatinine, Ser 0.89; Potassium 4.1; Sodium 138 08/09/2019: Hemoglobin 15.7; Platelets 199   Wt Readings from Last 3 Encounters:  08/16/19 192 lb 8 oz (87.3 kg)  08/09/19 195 lb (88.5 kg)  07/08/19 195 lb 12.8 oz (88.8 kg)     Other studies personally reviewed: Additional studies/ records that were reviewed today include: prior cath notes,  Prior echos, Dr Gillermina Hu notes  Review of the above records today demonstrates: as above  ASSESSMENT & PLAN:    1.  Ischemic CM The patient has an ischemic CM (EF 25%), NYHA Class III CHF, and CAD.  He is referred by Dr [name] for risk stratification of sudden death and consideration of ICD implantation.  He has narrow QRS and does not meet criteria for CRT.  At this time, he meets MADIT II/ SCD-HeFT criteria for ICD  implantation for primary prevention of sudden death.  I have had a thorough discussion with the patient reviewing options.  The patient and their family (if available) have had opportunities to ask questions and have them answered. The patient and I have decided together through a shared decision making process to proceed with an ICD at this time.   Risks, benefits, alternatives to ICD implantation were discussed in detail with the patient today. The patient understands that the risks include but are not limited to bleeding, infection, pneumothorax, perforation, tamponade, vascular damage, renal failure, MI, stroke, death, inappropriate shocks, and lead dislodgement and wishes to proceed.  We will therefore schedule device implantation at the next available time.  Will see if we can switch prasugrel to plavix prior to the procedure to reduce bleeding risks.  Current medicines are reviewed at length with the patient today.   The patient does not have concerns regarding his medicines.  The following changes were made today:  none  Labs/ tests ordered today include:  No orders of the defined types were placed in this encounter.   Patient Risk:  after full review of this patients clinical status, I feel that they are at moderate risk at this time.   Today, I have spent 20 minutes with the patient with telehealth technology discussing ICD implantation .    Signed, [name] MD, [facility] 08/16/2019 11:31 AM   [facility] [address] [place] [address] [phone] (office) [phone] (fax)

## 2019-08-16 NOTE — Telephone Encounter (Signed)
-----   Message from [name], MD sent at 08/16/2019 11:40 AM EDT ----- [name],  Please schedule for ICD implant.     Dan, could we switch from Effient to Plavix to reduce bleeding risks with implant?

## 2019-08-16 NOTE — H&P (View-Only) (Signed)
Electrophysiology TeleHealth Note   Due to national recommendations of social distancing due to [place] 19, Audio/video telehealth visit is felt to be most appropriate for this patient at this time.  See MyChart message from today for patient consent regarding telehealth for [facility].   Date:  08/16/2019   ID:  Brandon [name], DOB 1955/06/06, MRN [id number]  Location: home Provider location: [place] Evaluation Performed: New patient consult  PCP:  [name], MD  Cardiologist:  [name], MD  CHF: [place]  Chief Complaint:  CHF  History of Present Illness:    Brandon [name] is a 64 y.o. male who presents via audio/video conferencing for a telehealth visit today.   The patient is referred for new consultation regarding risk stratification of sudden death by Dr [name].   He has developed an ischemic CM (EF 25%) after large anterior MI 04/27/2019.  He underwent cath by Dr Irish Lack which showed acute mid occlusion of the LAD for which he underwent PCI.  He has chronic RCA occlusion with L--> R collaterals.  He has been treated with an optimal medical therapy but continues to have a reduced EF.  He has SOB with activity.  He also has fatigue.  He is walking with cardiac rehab and making some progress.   Today, he denies symptoms of palpitations, chest pain, lower extremity edema, claudication, dizziness, presyncope, syncope, bleeding, or neurologic sequela. The patient is tolerating medications without difficulties and is otherwise without complaint today.    Past Medical History:  Diagnosis Date  . Ankylosing spondylitis ([place])   . Arthritis   . BENIGN PROSTATIC HYPERTROPHY 06/07/2008  . COLONIC POLYPS, HX OF 06/07/2008  . H/O hiatal hernia   . HYPERLIPIDEMIA 06/07/2008  . HYPERTENSION 06/07/2008  . Myocardial infarction [facility]) 2005   NSTEMI, s/p LAD stent  . NEPHROLITHIASIS, HX OF 06/07/2008  . STEMI (ST elevation myocardial infarction) ([place]) 04/27/2019     Past Surgical History:  Procedure Laterality Date  . CORONARY ANGIOPLASTY WITH STENT PLACEMENT  2005   LAD stent, jailed diagonal  . CORONARY/GRAFT ACUTE MI REVASCULARIZATION N/A 04/27/2019   Procedure: Coronary/Graft Acute MI Revascularization;  Surgeon: [name], MD;  Location: [facility] CV LAB;  Service: Cardiovascular;  Laterality: N/A;  . LAMINECTOMY     lumbar  . LEFT HEART CATH AND CORONARY ANGIOGRAPHY N/A 04/27/2019   Procedure: LEFT HEART CATH AND CORONARY ANGIOGRAPHY;  Surgeon: [name], MD;  Location: [place] CV LAB;  Service: Cardiovascular;  Laterality: N/A;  . LUMBAR FUSION  01/2012  . RIGHT HEART CATH N/A 04/27/2019   Procedure: RIGHT HEART CATH;  Surgeon: [name], Jerren M, MD;  Location: [place] CV LAB;  Service: Cardiovascular;  Laterality: N/A;  . TONSILLECTOMY    . warthin tumor removal Left 2014    Current Outpatient Medications  Medication Sig Dispense Refill  . aspirin 81 MG chewable tablet Chew 1 tablet (81 mg total) by mouth daily. 30 tablet 3  . atorvastatin (LIPITOR) 80 MG tablet TAKE 1 TABLET BY MOUTH ONCE DAILY AT 6PM. 90 tablet 3  . carvedilol (COREG) 3.125 MG tablet Take 1 tablet (3.125 mg total) by mouth 2 (two) times daily. 180 tablet 3  . digoxin (LANOXIN) 0.125 MG tablet Take 1 tablet (0.125 mg total) by mouth daily. 30 tablet 3  . eplerenone (INSPRA) 25 MG tablet Take 1 tablet (25 mg total) by mouth daily. 90 tablet 3  . ezetimibe (ZETIA) 10 MG  tablet Take 1 tablet (10 mg total) by mouth daily. 90 tablet 3  . furosemide (LASIX) 20 MG tablet Take 2 tablets (40 mg total) by mouth as needed. 180 tablet 0  . inFLIXimab in sodium chloride 0.9 % Inject into the vein every 6 (six) weeks.    . prasugrel (EFFIENT) 10 MG TABS tablet Take 1 tablet (10 mg total) by mouth daily. 90 tablet 3  . sacubitril-valsartan (ENTRESTO) 24-26 MG Take 1 tablet by mouth 2 (two) times daily. 60 tablet 3  . tamsulosin (FLOMAX) 0.4 MG CAPS  capsule TAKE 1 CAPSULE(0.4 MG) BY MOUTH DAILY 90 capsule 1   No current facility-administered medications for this visit.    Allergies:   Patient has no known allergies.   Social History:  The patient  reports that he quit smoking about 3 weeks ago. His smoking use included cigarettes. He has a 20.00 pack-year smoking history. He has never used smokeless tobacco. He reports current alcohol use. He reports that he does not use drugs.   Family History:  The patient's family history includes Arthritis in his sister; Depression in his father; Heart failure in his mother.    ROS:  Please see the history of present illness.   All other systems are personally reviewed and negative.    Exam:    Vital Signs:  BP 109/70   Pulse 88   Ht 6' (1.829 m)   Wt 192 lb 8 oz (87.3 kg)   BMI 26.11 kg/m    Well appearing, alert and conversant, regular work of breathing,  good skin color Eyes- anicteric, neuro- grossly intact, skin- no apparent rash or lesions or cyanosis, mouth- oral mucosa is pink   Labs/Other Tests and Data Reviewed:    Recent Labs: 04/27/2019: TSH 0.889 04/28/2019: Magnesium 1.9 06/10/2019: ALT 22 07/15/2019: BUN 12; Creatinine, Ser 0.89; Potassium 4.1; Sodium 138 08/09/2019: Hemoglobin 15.7; Platelets 199   Wt Readings from Last 3 Encounters:  08/16/19 192 lb 8 oz (87.3 kg)  08/09/19 195 lb (88.5 kg)  07/08/19 195 lb 12.8 oz (88.8 kg)     Other studies personally reviewed: Additional studies/ records that were reviewed today include: prior cath notes,  Prior echos, Dr Gillermina Hu notes  Review of the above records today demonstrates: as above  ASSESSMENT & PLAN:    1.  Ischemic CM The patient has an ischemic CM (EF 25%), NYHA Class III CHF, and CAD.  He is referred by Dr [name] for risk stratification of sudden death and consideration of ICD implantation.  He has narrow QRS and does not meet criteria for CRT.  At this time, he meets MADIT II/ SCD-HeFT criteria for ICD  implantation for primary prevention of sudden death.  I have had a thorough discussion with the patient reviewing options.  The patient and their family (if available) have had opportunities to ask questions and have them answered. The patient and I have decided together through a shared decision making process to proceed with an ICD at this time.   Risks, benefits, alternatives to ICD implantation were discussed in detail with the patient today. The patient understands that the risks include but are not limited to bleeding, infection, pneumothorax, perforation, tamponade, vascular damage, renal failure, MI, stroke, death, inappropriate shocks, and lead dislodgement and wishes to proceed.  We will therefore schedule device implantation at the next available time.  Will see if we can switch prasugrel to plavix prior to the procedure to reduce bleeding risks.  Current medicines are reviewed at length with the patient today.   The patient does not have concerns regarding his medicines.  The following changes were made today:  none  Labs/ tests ordered today include:  No orders of the defined types were placed in this encounter.   Patient Risk:  after full review of this patients clinical status, I feel that they are at moderate risk at this time.   Today, I have spent 20 minutes with the patient with telehealth technology discussing ICD implantation .    Signed, [name] MD, [facility] 08/16/2019 11:31 AM   [facility] [address] [place] [address] [phone] (office) [phone] (fax)

## 2019-08-17 ENCOUNTER — Encounter (HOSPITAL_COMMUNITY)
Admission: RE | Admit: 2019-08-17 | Discharge: 2019-08-17 | Disposition: A | Discharge status: Home / Self Care | Payer: Self-pay | Source: Ambulatory Visit | Attending: Cardiology | Admitting: Cardiology

## 2019-08-17 ENCOUNTER — Other Ambulatory Visit: Payer: Self-pay

## 2019-08-17 DIAGNOSIS — Z955 Presence of coronary angioplasty implant and graft: Secondary | ICD-10-CM | POA: Insufficient documentation

## 2019-08-17 DIAGNOSIS — I2102 ST elevation (STEMI) myocardial infarction involving left anterior descending coronary artery: Secondary | ICD-10-CM | POA: Insufficient documentation

## 2019-08-17 NOTE — Progress Notes (Signed)
Virtual Cardiac Rehab Note:  Successful telephone encounter to [name] to follow up on recent ECHO and cardiology visits. EMR notes reviewed. Patient in need of AICD secondary to low EF. Per patient, awaiting procedure scheduling. Patient states he is disappointment that his heart function did not improve with following his cardiac plan of care including daily exercise, medication compliance, MD appointment adherence, and following a heart healthy, low sodium diet. Emotional support and active listening provided.   Patient is scheduled to graduate from the [employer], Virtual Cardiac Rehab App 08/24/19. With patient agreement, his time in the VCR program will be extended by 1 month to allow close follow up by cardiac rehab virtual RN Case Manager. Brandon [name] is also offered the opportunity to return to CR for in-person exercise and monitoring. He will consider however exercising while wearing a mask is a significant barrier.  Patient is also encouraged to continue to exercise daily unless otherwise discussed by cardiology. Patient states cardiologist was supportive of patients participation in CR and per EMR encourages patient to "continue cardiac rehab"  Plan: Will continue to follow closely   Bilan Tedesco E. Rollene Rotunda RN, BSN [place]. [place]  [facility] Phone: [phone] Fax: [phone]

## 2019-08-19 ENCOUNTER — Encounter (HOSPITAL_COMMUNITY): Payer: Self-pay

## 2019-08-20 ENCOUNTER — Other Ambulatory Visit (HOSPITAL_COMMUNITY): Payer: Self-pay | Admitting: Internal Medicine

## 2019-08-20 ENCOUNTER — Encounter (HOSPITAL_COMMUNITY)
Admission: RE | Admit: 2019-08-20 | Discharge: 2019-08-20 | Disposition: A | Discharge status: Home / Self Care | Payer: Self-pay | Source: Ambulatory Visit | Attending: Cardiology | Admitting: Cardiology

## 2019-08-20 ENCOUNTER — Other Ambulatory Visit: Payer: Self-pay

## 2019-08-20 MED ORDER — CLOPIDOGREL BISULFATE 75 MG PO TABS: ORAL_TABLET | ORAL | 3 refills | Status: DC

## 2019-08-20 NOTE — Progress Notes (Signed)
Nutrition Note: Virtual Visit  Spoke with pt for his final virtual cardiac rehab nutrition visit. He has done a great job with his exercise and diet changes. He reports continuing to read labels. He limits sodium to <2000 mg/d. His weight has dropped 2 lbs since start of VCR in January. His goal to increase fiber was also met by incorporating beans, whole grains, veggies, and fruits. He was able to eliminate sugary beverages as well.   Pt feel confident in his diet and exercise habits. Encouraged pt to continue eating a heart healthy diet but no further intervention needed.  [name], MS, RDN, LDN

## 2019-08-20 NOTE — Telephone Encounter (Signed)
Call placed to Pt.  Pt will change from Effient to Plavix.  Advised to hold Effient for 24 hours.  Then take 300 mg x1 of Plavix.  After 1st day of 300 mg Plavix-then reduce Plavix to 75 mg daily  Per Dr. Illene Silver 7 days after loading dose before scheduling ICD implant.  Pt scheduled for ICD implant on 08/31/19 at 1:30 pm with Dr. Rayann Heman.  Labs/covid test scheduled  Work up complete.

## 2019-08-27 ENCOUNTER — Other Ambulatory Visit (HOSPITAL_COMMUNITY)
Admission: RE | Admit: 2019-08-27 | Discharge: 2019-08-27 | Disposition: A | Discharge status: Home / Self Care | Payer: Medicare Other | Source: Ambulatory Visit | Attending: Internal Medicine | Admitting: Internal Medicine

## 2019-08-27 ENCOUNTER — Other Ambulatory Visit: Payer: Self-pay

## 2019-08-27 ENCOUNTER — Other Ambulatory Visit: Payer: Medicare Other | Admitting: *Deleted

## 2019-08-27 DIAGNOSIS — Z01812 Encounter for preprocedural laboratory examination: Secondary | ICD-10-CM | POA: Diagnosis present

## 2019-08-27 DIAGNOSIS — Z20822 Contact with and (suspected) exposure to covid-19: Secondary | ICD-10-CM | POA: Diagnosis not present

## 2019-08-27 DIAGNOSIS — I255 Ischemic cardiomyopathy: Secondary | ICD-10-CM

## 2019-08-27 LAB — BASIC METABOLIC PANEL
BUN/Creatinine Ratio: 18 (ref 10–24)
BUN: 15 mg/dL (ref 8–27)
CO2: 28 mmol/L (ref 20–29)
Calcium: 9.4 mg/dL (ref 8.6–10.2)
Chloride: 100 mmol/L (ref 96–106)
Creatinine, Ser: 0.82 mg/dL (ref 0.76–1.27)
GFR calc Af Amer: 107 mL/min/{1.73_m2} (ref >59)
GFR calc non Af Amer: 93 mL/min/{1.73_m2} (ref >59)
Glucose: 101 mg/dL — ABNORMAL HIGH (ref 65–99)
Potassium: 4.2 mmol/L (ref 3.5–5.2)
Sodium: 138 mmol/L (ref 134–144)

## 2019-08-27 LAB — SARS CORONAVIRUS 2 (TAT 6-24 HRS): SARS Coronavirus 2: NEGATIVE

## 2019-08-30 NOTE — Progress Notes (Signed)
Attempted to call patient regarding instructions for procedure for tomorrow.  No answer.

## 2019-08-31 ENCOUNTER — Encounter (HOSPITAL_COMMUNITY): Payer: Self-pay | Admitting: Internal Medicine

## 2019-08-31 ENCOUNTER — Ambulatory Visit (HOSPITAL_COMMUNITY)
Admission: RE | Admit: 2019-08-31 | Discharge: 2019-09-01 | Disposition: A | Discharge status: Home / Self Care | Payer: Medicare Other | Attending: Internal Medicine | Admitting: Internal Medicine

## 2019-08-31 ENCOUNTER — Other Ambulatory Visit: Payer: Self-pay

## 2019-08-31 ENCOUNTER — Ambulatory Visit (HOSPITAL_COMMUNITY)
Admission: RE | Disposition: A | Discharge status: Home / Self Care | Payer: Self-pay | Source: Home / Self Care | Attending: Internal Medicine

## 2019-08-31 ENCOUNTER — Inpatient Hospital Stay (HOSPITAL_COMMUNITY): Admission: RE | Admit: 2019-08-31 | Payer: Medicare Other | Source: Ambulatory Visit

## 2019-08-31 DIAGNOSIS — I110 Hypertensive heart disease with heart failure: Secondary | ICD-10-CM | POA: Insufficient documentation

## 2019-08-31 DIAGNOSIS — M1990 Unspecified osteoarthritis, unspecified site: Secondary | ICD-10-CM | POA: Insufficient documentation

## 2019-08-31 DIAGNOSIS — I255 Ischemic cardiomyopathy: Secondary | ICD-10-CM | POA: Diagnosis present

## 2019-08-31 DIAGNOSIS — E785 Hyperlipidemia, unspecified: Secondary | ICD-10-CM | POA: Diagnosis not present

## 2019-08-31 DIAGNOSIS — Z79899 Other long term (current) drug therapy: Secondary | ICD-10-CM | POA: Insufficient documentation

## 2019-08-31 DIAGNOSIS — N400 Enlarged prostate without lower urinary tract symptoms: Secondary | ICD-10-CM | POA: Diagnosis not present

## 2019-08-31 DIAGNOSIS — Z955 Presence of coronary angioplasty implant and graft: Secondary | ICD-10-CM | POA: Insufficient documentation

## 2019-08-31 DIAGNOSIS — Z7902 Long term (current) use of antithrombotics/antiplatelets: Secondary | ICD-10-CM | POA: Insufficient documentation

## 2019-08-31 DIAGNOSIS — I2510 Atherosclerotic heart disease of native coronary artery without angina pectoris: Secondary | ICD-10-CM | POA: Diagnosis not present

## 2019-08-31 DIAGNOSIS — Z8261 Family history of arthritis: Secondary | ICD-10-CM | POA: Insufficient documentation

## 2019-08-31 DIAGNOSIS — I5022 Chronic systolic (congestive) heart failure: Secondary | ICD-10-CM | POA: Diagnosis not present

## 2019-08-31 DIAGNOSIS — Z7982 Long term (current) use of aspirin: Secondary | ICD-10-CM | POA: Insufficient documentation

## 2019-08-31 DIAGNOSIS — Z87891 Personal history of nicotine dependence: Secondary | ICD-10-CM | POA: Insufficient documentation

## 2019-08-31 DIAGNOSIS — Z006 Encounter for examination for normal comparison and control in clinical research program: Secondary | ICD-10-CM | POA: Insufficient documentation

## 2019-08-31 DIAGNOSIS — I252 Old myocardial infarction: Secondary | ICD-10-CM | POA: Diagnosis not present

## 2019-08-31 DIAGNOSIS — Z95810 Presence of automatic (implantable) cardiac defibrillator: Secondary | ICD-10-CM

## 2019-08-31 DIAGNOSIS — Z8249 Family history of ischemic heart disease and other diseases of the circulatory system: Secondary | ICD-10-CM | POA: Diagnosis not present

## 2019-08-31 DIAGNOSIS — Z959 Presence of cardiac and vascular implant and graft, unspecified: Secondary | ICD-10-CM

## 2019-08-31 HISTORY — PX: ICD IMPLANT: EP1208

## 2019-08-31 HISTORY — DX: Heart failure, unspecified: I50.9

## 2019-08-31 HISTORY — DX: Presence of automatic (implantable) cardiac defibrillator: Z95.810

## 2019-08-31 SURGERY — ICD IMPLANT

## 2019-08-31 MED ORDER — MELATONIN 10 MG PO CAPS: 10.0000 mg | ORAL_CAPSULE | Freq: Every evening | ORAL | Status: DC | PRN

## 2019-08-31 MED ORDER — CEFAZOLIN SODIUM-DEXTROSE 2-4 GM/100ML-% IV SOLN
2.0000 g | INTRAVENOUS | Status: AC
  Administered 2019-08-31: 2 g via INTRAVENOUS
  Filled 2019-08-31: qty 100

## 2019-08-31 MED ORDER — CHLORHEXIDINE GLUCONATE 4 % EX LIQD
4.0000 "application " | Freq: Once | CUTANEOUS | Status: DC
  Filled 2019-08-31: qty 60

## 2019-08-31 MED ORDER — SACUBITRIL-VALSARTAN 24-26 MG PO TABS
1.0000 | ORAL_TABLET | Freq: Two times a day (BID) | ORAL | Status: DC
  Administered 2019-08-31 – 2019-09-01 (×2): 1 via ORAL
  Filled 2019-08-31 (×2): qty 1

## 2019-08-31 MED ORDER — FENTANYL CITRATE (PF) 100 MCG/2ML IJ SOLN
INTRAMUSCULAR | Status: DC | PRN
  Administered 2019-08-31 (×2): 12.5 ug via INTRAVENOUS

## 2019-08-31 MED ORDER — HEPARIN (PORCINE) IN NACL 1000-0.9 UT/500ML-% IV SOLN
INTRAVENOUS | Status: AC
  Filled 2019-08-31: qty 500

## 2019-08-31 MED ORDER — MIDAZOLAM HCL 5 MG/5ML IJ SOLN
INTRAMUSCULAR | Status: AC
  Filled 2019-08-31: qty 5

## 2019-08-31 MED ORDER — CEFAZOLIN SODIUM-DEXTROSE 2-4 GM/100ML-% IV SOLN
INTRAVENOUS | Status: AC
  Filled 2019-08-31: qty 100

## 2019-08-31 MED ORDER — CEFAZOLIN SODIUM-DEXTROSE 1-4 GM/50ML-% IV SOLN
1.0000 g | Freq: Four times a day (QID) | INTRAVENOUS | Status: AC
  Administered 2019-08-31 – 2019-09-01 (×3): 1 g via INTRAVENOUS
  Filled 2019-08-31 (×3): qty 50

## 2019-08-31 MED ORDER — SODIUM CHLORIDE 0.9 % IV SOLN: INTRAVENOUS | Status: DC

## 2019-08-31 MED ORDER — SPIRONOLACTONE 25 MG PO TABS
25.0000 mg | ORAL_TABLET | Freq: Every day | ORAL | Status: DC
  Administered 2019-08-31 – 2019-09-01 (×2): 25 mg via ORAL
  Filled 2019-08-31 (×2): qty 1

## 2019-08-31 MED ORDER — HYDROCODONE-ACETAMINOPHEN 5-325 MG PO TABS
1.0000 | ORAL_TABLET | ORAL | Status: DC | PRN
  Administered 2019-08-31 (×2): 1 via ORAL
  Administered 2019-09-01 (×2): 2 via ORAL
  Filled 2019-08-31: qty 2
  Filled 2019-08-31 (×2): qty 1
  Filled 2019-08-31: qty 2

## 2019-08-31 MED ORDER — MIDAZOLAM HCL 5 MG/5ML IJ SOLN
INTRAMUSCULAR | Status: DC | PRN
  Administered 2019-08-31 (×3): 1 mg via INTRAVENOUS

## 2019-08-31 MED ORDER — SODIUM CHLORIDE 0.9 % IV SOLN
INTRAVENOUS | Status: AC
  Filled 2019-08-31: qty 2

## 2019-08-31 MED ORDER — ACETAMINOPHEN 325 MG PO TABS: 325.0000 mg | ORAL_TABLET | ORAL | Status: DC | PRN

## 2019-08-31 MED ORDER — LIDOCAINE HCL 1 % IJ SOLN
INTRAMUSCULAR | Status: AC
  Filled 2019-08-31: qty 20

## 2019-08-31 MED ORDER — SODIUM CHLORIDE 0.9 % IV SOLN
80.0000 mg | INTRAVENOUS | Status: AC
  Administered 2019-08-31: 80 mg
  Filled 2019-08-31: qty 2

## 2019-08-31 MED ORDER — SODIUM CHLORIDE 0.9% FLUSH: 3.0000 mL | INTRAVENOUS | Status: DC | PRN

## 2019-08-31 MED ORDER — SODIUM CHLORIDE 0.9 % IV SOLN: 250.0000 mL | INTRAVENOUS | Status: DC | PRN

## 2019-08-31 MED ORDER — CLOPIDOGREL BISULFATE 75 MG PO TABS: 75.0000 mg | ORAL_TABLET | Freq: Every day | ORAL | Status: DC

## 2019-08-31 MED ORDER — LIDOCAINE HCL 1 % IJ SOLN
INTRAMUSCULAR | Status: AC
  Filled 2019-08-31: qty 60

## 2019-08-31 MED ORDER — SODIUM CHLORIDE 0.9% FLUSH
3.0000 mL | Freq: Two times a day (BID) | INTRAVENOUS | Status: DC
  Administered 2019-08-31: 3 mL via INTRAVENOUS

## 2019-08-31 MED ORDER — TAMSULOSIN HCL 0.4 MG PO CAPS
0.4000 mg | ORAL_CAPSULE | Freq: Every day | ORAL | Status: DC
  Administered 2019-08-31 – 2019-09-01 (×2): 0.4 mg via ORAL
  Filled 2019-08-31 (×2): qty 1

## 2019-08-31 MED ORDER — MELATONIN 3 MG PO TABS: 9.0000 mg | ORAL_TABLET | Freq: Every evening | ORAL | Status: DC | PRN

## 2019-08-31 MED ORDER — DIGOXIN 125 MCG PO TABS
125.0000 ug | ORAL_TABLET | Freq: Every day | ORAL | Status: DC
  Administered 2019-09-01: 125 ug via ORAL
  Filled 2019-08-31: qty 1

## 2019-08-31 MED ORDER — ASPIRIN 81 MG PO CHEW
81.0000 mg | CHEWABLE_TABLET | Freq: Every day | ORAL | Status: DC
  Administered 2019-09-01: 81 mg via ORAL
  Filled 2019-08-31: qty 1

## 2019-08-31 MED ORDER — HEPARIN (PORCINE) IN NACL 1000-0.9 UT/500ML-% IV SOLN
INTRAVENOUS | Status: DC | PRN
  Administered 2019-08-31 (×2): 500 mL

## 2019-08-31 MED ORDER — ONDANSETRON HCL 4 MG/2ML IJ SOLN
4.0000 mg | Freq: Four times a day (QID) | INTRAMUSCULAR | Status: DC | PRN
  Administered 2019-09-01: 4 mg via INTRAVENOUS
  Filled 2019-08-31: qty 2

## 2019-08-31 MED ORDER — CARVEDILOL 3.125 MG PO TABS
3.1250 mg | ORAL_TABLET | Freq: Two times a day (BID) | ORAL | Status: DC
  Administered 2019-09-01: 3.125 mg via ORAL
  Filled 2019-08-31: qty 1

## 2019-08-31 MED ORDER — FENTANYL CITRATE (PF) 100 MCG/2ML IJ SOLN
INTRAMUSCULAR | Status: AC
  Filled 2019-08-31: qty 2

## 2019-08-31 SURGICAL SUPPLY — 9 items
CABLE SURGICAL S-101-97-12 (CABLE) ×2 IMPLANT
ICD GALLANT VR CDVRA500Q (ICD Generator) ×2 IMPLANT
KIT MICROPUNCTURE NIT STIFF (SHEATH) ×2 IMPLANT
LEAD DURATA 7122-65CM (Lead) ×2 IMPLANT
PAD PRO RADIOLUCENT 2001M-C (PAD) ×2 IMPLANT
SHEATH 7FR PRELUDE SNAP 13 (SHEATH) ×2 IMPLANT
SHEATH PINNACLE 6F 10CM (SHEATH) ×2 IMPLANT
TRAY PACEMAKER INSERTION (PACKS) ×2 IMPLANT
WIRE HI TORQ VERSACORE-J 145CM (WIRE) ×2 IMPLANT

## 2019-08-31 NOTE — Interval H&P Note (Signed)
History and Physical Interval Note:  08/31/2019 2:19 PM  Brandon [name]  has presented today for surgery, with the diagnosis of cardiomyopathy.  The various methods of treatment have been discussed with the patient and family. After consideration of risks, benefits and other options for treatment, the patient has consented to  Procedure(s): ICD IMPLANT (N/A) as a surgical intervention.  The patient's history has been reviewed, patient examined, no change in status, stable for surgery.  I have reviewed the patient's chart and labs.  Questions were answered to the patient's satisfaction.     ICD Criteria  Current LVEF:25%. Within 12 months prior to implant: Yes   Heart failure history: Yes, Class III  Cardiomyopathy history: No.  Atrial Fibrillation/Atrial Flutter: No.  Ventricular tachycardia history: No.  Cardiac arrest history: No.  History of syndromes with risk of sudden death: No.  Previous ICD: No.  Current ICD indication: Primary  PPM indication: No.  Class I or II Bradycardia indication present: No  Beta Blocker therapy for 3 or more months: Yes, prescribed.   Ace Inhibitor/ARB therapy for 3 or more months: Yes, prescribed.    I have seen Brandon [name] is a 64 y.o. male pre-procedural and has been referred by Dr [name] for consideration of ICD implant for  prevention of sudden death.  The patient's chart has been reviewed and they meet criteria for ICD implant. He has a narrow QRS and does not meet criteria for CRT.  I have had a thorough discussion with the patient reviewing options.  The patient has had opportunities to ask questions and have them answered. The patient and I have decided together through the [place] Support Tool to proceed with ICD at this time.  Risks, benefits, alternatives to ICD implantation were discussed in detail with the patient today. The patient  understands that the risks include but are not limited to bleeding,  infection, pneumothorax, perforation, tamponade, vascular damage, renal failure, MI, stroke, death, inappropriate shocks, and lead dislodgement and wishes to proceed.    [name]

## 2019-08-31 NOTE — Discharge Summary (Addendum)
ELECTROPHYSIOLOGY PROCEDURE DISCHARGE SUMMARY    Patient ID: Brandon [name],  MRN: [id number], DOB/AGE: 05-16-1955 64 y.o.  Admit date: 08/31/2019 Discharge date: 09/01/19  Primary Care Physician: [name], MD  Primary Cardiologist: [name], MD  Electrophysiologist: Dr. Rayann Heman  Primary Diagnosis:  Chronic systolic CHF   Secondary Diagnosis: Ischemic cardiomyopathy NYHA III symptoms CAD  No Known Allergies   Procedures This Admission:  1.  Implantation of a St. Jude single chamber ICD on 08/31/2019 by Dr. Rayann Heman.  The patient received a St Jude model number [id number] ICD with model number [id number] right ventricular lead. DFT's were deferred at time of implant. There were no immediate post procedure complications. 2.  CXR on 09/01/19 demonstrated no pneumothorax status post device implantation.  Brief HPI: Brandon [name] is a 64 y.o. male was referred to electrophysiology in the outpatient setting  for consideration of ICD implantation.  Past medical history includes above.  The patient has persistent LV dysfunction despite guideline directed therapy.  Risks, benefits, and alternatives to ICD implantation were reviewed with the patient who wished to proceed.   Hospital Course:  The patient was admitted and underwent implantation of a St. Jude single chamber ICD with details as outlined above. They were monitored on telemetry overnight which demonstrated NSR 70-80s.  Left chest was without hematoma or ecchymosis.  The device was interrogated and found to be functioning normally.  CXR was obtained and demonstrated no pneumothorax status post device implantation..  Wound care, arm mobility, and restrictions were reviewed with the patient.  The patient was examined and considered stable for discharge to home.   The patient's discharge medications include an ACE-I/ARB/ARNI Delene Loll) and beta blocker (coreg).  Anticoagulation resumption The patient should resume their  Clopidogrel today with recent stenting (04/27/2019)  Physical Exam: Vitals:   08/31/19 2037 09/01/19 0014 09/01/19 0547 09/01/19 0814  BP: 117/76 125/88 115/68 106/77  Pulse: 85 90 82 83  Resp: 20 18 18    Temp: 98.2 F (36.8 C) 98.2 F (36.8 C) 98.4 F (36.9 C) 98.4 F (36.9 C)  TempSrc: Oral Oral Oral Oral  SpO2: 96% 96% 96% 95%  Weight:   87 kg   Height:        GEN- The patient is well appearing, alert and oriented x 3 today.   HEENT: normocephalic, atraumatic; sclera clear, conjunctiva pink; hearing intact; oropharynx clear; neck supple, no JVP Lymph- no cervical lymphadenopathy Lungs- Clear to ausculation bilaterally, normal work of breathing.  No wheezes, rales, rhonchi Heart- Regular rate and rhythm, no murmurs, rubs or gallops, PMI not laterally displaced GI- soft, non-tender, non-distended, bowel sounds present, no hepatosplenomegaly Extremities- no clubbing, cyanosis, or edema; DP/PT/radial pulses 2+ bilaterally MS- no significant deformity or atrophy Skin- warm and dry, no rash or lesion. ICD site stable. Psych- euthymic mood, full affect Neuro- strength and sensation are intact   Labs:   Lab Results  Component Value Date   WBC 7.2 08/09/2019   HGB 15.7 08/09/2019   HCT 48.7 08/09/2019   MCV 89.9 08/09/2019   PLT 199 08/09/2019    Recent Labs  Lab 09/01/19 0452  NA 136  K 4.0  CL 103  CO2 22  BUN 20  CREATININE 0.93  CALCIUM 9.0  GLUCOSE 98    Discharge Medications:  Allergies as of 09/01/2019   No Known Allergies     Medication List    TAKE these medications   acetaminophen 325 MG tablet Commonly known  as: TYLENOL Take 1-2 tablets (325-650 mg total) by mouth every 4 (four) hours as needed for mild pain.   ARTIFICIAL TEARS OP Place 1 drop into both eyes daily as needed (dry eyes).   aspirin 81 MG chewable tablet Chew 1 tablet (81 mg total) by mouth daily.   atorvastatin 80 MG tablet Commonly known as: LIPITOR TAKE 1 TABLET BY MOUTH  ONCE DAILY AT 6PM. What changed:   how much to take  how to take this  when to take this  additional instructions   carvedilol 3.125 MG tablet Commonly known as: COREG Take 1 tablet (3.125 mg total) by mouth 2 (two) times daily.   clopidogrel 75 MG tablet Commonly known as: PLAVIX Stop Effient. Wait 24 hours.  Take 300 mg of Plavix ONCE.  Then take 75 mg by mouth daily. What changed:   how much to take  how to take this  when to take this   digoxin 0.125 MG tablet Commonly known as: LANOXIN TAKE 1 TABLET BY MOUTH DAILY   diphenhydrAMINE 25 MG tablet Commonly known as: BENADRYL Take 50 mg by mouth at bedtime as needed for sleep.   eplerenone 25 MG tablet Commonly known as: INSPRA Take 1 tablet (25 mg total) by mouth daily.   ezetimibe 10 MG tablet Commonly known as: ZETIA Take 1 tablet (10 mg total) by mouth daily.   furosemide 20 MG tablet Commonly known as: LASIX Take 2 tablets (40 mg total) by mouth as needed. What changed:   when to take this  reasons to take this   inFLIXimab in sodium chloride 0.9 % Inject into the vein every 6 (six) weeks.   loratadine 10 MG tablet Commonly known as: CLARITIN Take 10 mg by mouth daily as needed for allergies.   Melatonin 10 MG Caps Take 10 mg by mouth at bedtime as needed (sleep).   sacubitril-valsartan 24-26 MG Commonly known as: ENTRESTO Take 1 tablet by mouth 2 (two) times daily.   tamsulosin 0.4 MG Caps capsule Commonly known as: FLOMAX TAKE 1 CAPSULE(0.4 MG) BY MOUTH DAILY What changed: See the new instructions.       Disposition:   Follow-up Information    Shirley Friar, PA-C Follow up on 10/13/2019.   Specialty: Physician Assistant Why: for 6 week post ICD discussion Contact information: [place] [address] [phone]        [name], MD Follow up on 12/06/2019.   Specialty: Cardiology Why: at 215pm for 3 month post ICD check Contact  information: [address] [place] [address] [place] Follow up on 09/14/2019.   Why: at 11 am for post ICD wound check Contact information: [place] [address] [phone]          Duration of Discharge Encounter: Greater than 30 minutes including physician time.  Signed, Shirley Friar, PA-C  09/01/2019 8:56 AM   Device interrogation is personally reviewed and normal.  CXR reveals stable lead, no ptx  DC to home with routine wound care and follow-up  [name] MD, [facility]

## 2019-09-01 ENCOUNTER — Ambulatory Visit (HOSPITAL_COMMUNITY): Payer: Medicare Other

## 2019-09-01 DIAGNOSIS — I255 Ischemic cardiomyopathy: Secondary | ICD-10-CM | POA: Diagnosis not present

## 2019-09-01 DIAGNOSIS — I110 Hypertensive heart disease with heart failure: Secondary | ICD-10-CM | POA: Diagnosis not present

## 2019-09-01 DIAGNOSIS — I5022 Chronic systolic (congestive) heart failure: Secondary | ICD-10-CM | POA: Diagnosis not present

## 2019-09-01 DIAGNOSIS — Z006 Encounter for examination for normal comparison and control in clinical research program: Secondary | ICD-10-CM | POA: Diagnosis not present

## 2019-09-01 LAB — BASIC METABOLIC PANEL
Anion gap: 11 (ref 5–15)
BUN: 20 mg/dL (ref 8–23)
CO2: 22 mmol/L (ref 22–32)
Calcium: 9 mg/dL (ref 8.9–10.3)
Chloride: 103 mmol/L (ref 98–111)
Creatinine, Ser: 0.93 mg/dL (ref 0.61–1.24)
GFR calc Af Amer: >60 mL/min (ref >60)
GFR calc non Af Amer: >60 mL/min (ref >60)
Glucose, Bld: 98 mg/dL (ref 70–99)
Potassium: 4 mmol/L (ref 3.5–5.1)
Sodium: 136 mmol/L (ref 135–145)

## 2019-09-01 MED ORDER — CLOPIDOGREL BISULFATE 75 MG PO TABS
75.0000 mg | ORAL_TABLET | Freq: Every day | ORAL | Status: DC
  Administered 2019-09-01: 75 mg via ORAL
  Filled 2019-09-01: qty 1

## 2019-09-01 MED ORDER — ACETAMINOPHEN 325 MG PO TABS: 325.0000 mg | ORAL_TABLET | ORAL | Status: DC | PRN

## 2019-09-01 MED ORDER — CLOPIDOGREL BISULFATE 75 MG PO TABS: ORAL_TABLET | ORAL | 3 refills | Status: DC

## 2019-09-01 MED FILL — Lidocaine HCl Local Inj 1%: INTRAMUSCULAR | Qty: 80 | Status: AC

## 2019-09-01 NOTE — Discharge Instructions (Signed)
After Your ICD (Implantable Cardiac Defibrillator)   . You have a St. Jude ICD  ACTIVITY . Do not lift your arm above shoulder height for 1 week after your procedure. After 7 days, you may progress as below.     Wednesday September 08, 2019  Thursday September 09, 2019 Friday September 10, 2019 Saturday Sep 11, 2019   . Do not lift, push, pull, or carry anything over 10 pounds with the affected arm until 6 weeks (Wednesday October 13, 2019 ) after your procedure.   . Do NOT DRIVE until you have been seen for your wound check, or as long as instructed by your healthcare provider.   . Ask your healthcare provider when you can go back to work   INCISION/Dressing . If you are on a blood thinner such as Coumadin, Xarelto, Eliquis, Plavix, or Pradaxa please confirm with your provider when this should be resumed.   . Monitor your defibrillator site for redness, swelling, and drainage. Call the device clinic at [phone] if you experience these symptoms or fever/chills.  . If your incision is sealed with Steri-strips or staples, you may shower 10 days after your procedure or when told by your provider. Do not remove the steri-strips or let the shower hit directly on your site. You may wash around your site with soap and water.    [name] Avoid lotions, ointments, or perfumes over your incision until it is well-healed.  . You may use a hot tub or a pool AFTER your wound check appointment if the incision is completely closed.  . Your ICD is designed to protect you from life threatening heart rhythms. Because of this, you may receive a shock.   o 1 shock with no symptoms:  Call the office during business hours. o 1 shock with symptoms (chest pain, chest pressure, dizziness, lightheadedness, shortness of breath, overall feeling unwell):  Call 911. o If you experience 2 or more shocks in 24 hours:  Call 911. o If you receive a shock, you should not drive for 6 months per the [place] DMV IF you receive appropriate  therapy from your ICD.   . ICD Alerts:  Some alerts are vibratory and others beep. These are NOT emergencies. Please call our office to let us know. If this occurs at night or on weekends, it can wait until the next business day. Send a remote transmission.  . If your device is capable of reading fluid status (for heart failure), you will be offered monthly monitoring to review this with you.   DEVICE MANAGEMENT . Remote monitoring is used to monitor your ICD from home. This monitoring is scheduled every 91 days by our office. It allows [place] to keep an eye on the functioning of your device to ensure it is working properly. You will routinely see your Electrophysiologist annually (more often if necessary).   . You should receive your ID card for your new device in 4-8 weeks. Keep this card with you at all times once received. Consider wearing a medical alert bracelet or necklace.  . Your ICD  may be MRI compatible. This will be discussed at your next office visit/wound check.  You should avoid contact with strong electric or magnetic fields.    Do not use amateur (ham) radio equipment or electric (arc) welding torches. MP3 player headphones with magnets should not be used. Some devices are safe to use if held at least 12 inches (30 cm) from your defibrillator. These include power tools,  lawn mowers, and speakers. If you are unsure if something is safe to use, ask your health care provider.   When using your cell phone, hold it to the ear that is on the opposite side from the defibrillator. Do not leave your cell phone in a pocket over the defibrillator.   You may safely use electric blankets, heating pads, computers, and microwave ovens.  Call the office right away if:  You have chest pain.  You feel more than one shock.  You feel more short of breath than you have felt before.  You feel more light-headed than you have felt before.  Your incision starts to open up.  This information is  not intended to replace advice given to you by your health care provider. Make sure you discuss any questions you have with your health care provider.

## 2019-09-01 NOTE — Plan of Care (Signed)

## 2019-09-01 NOTE — Progress Notes (Signed)
Pt was taken to xray in wheelchair- upon getting on the table he began to get nauseated and weak- radiology techs called me. I took patient prn Zofran 4 mg- he began feeling better and was able to have radiographs done. Will monitor closely

## 2019-09-06 ENCOUNTER — Encounter (HOSPITAL_COMMUNITY): Payer: Self-pay

## 2019-09-06 ENCOUNTER — Other Ambulatory Visit: Payer: Self-pay

## 2019-09-06 ENCOUNTER — Ambulatory Visit (HOSPITAL_COMMUNITY)
Admission: RE | Admit: 2019-09-06 | Discharge: 2019-09-06 | Disposition: A | Discharge status: Home / Self Care | Payer: Medicare Other | Source: Ambulatory Visit | Attending: Cardiology | Admitting: Cardiology

## 2019-09-06 VITALS — BP 110/72 | HR 95 | Wt 194.8 lb

## 2019-09-06 DIAGNOSIS — I350 Nonrheumatic aortic (valve) stenosis: Secondary | ICD-10-CM | POA: Diagnosis not present

## 2019-09-06 DIAGNOSIS — I110 Hypertensive heart disease with heart failure: Secondary | ICD-10-CM | POA: Diagnosis not present

## 2019-09-06 DIAGNOSIS — I255 Ischemic cardiomyopathy: Secondary | ICD-10-CM | POA: Insufficient documentation

## 2019-09-06 DIAGNOSIS — Z79899 Other long term (current) drug therapy: Secondary | ICD-10-CM | POA: Insufficient documentation

## 2019-09-06 DIAGNOSIS — I252 Old myocardial infarction: Secondary | ICD-10-CM | POA: Diagnosis not present

## 2019-09-06 DIAGNOSIS — I10 Essential (primary) hypertension: Secondary | ICD-10-CM | POA: Diagnosis present

## 2019-09-06 DIAGNOSIS — Z7982 Long term (current) use of aspirin: Secondary | ICD-10-CM | POA: Insufficient documentation

## 2019-09-06 DIAGNOSIS — Z87891 Personal history of nicotine dependence: Secondary | ICD-10-CM | POA: Insufficient documentation

## 2019-09-06 DIAGNOSIS — I2510 Atherosclerotic heart disease of native coronary artery without angina pectoris: Secondary | ICD-10-CM | POA: Insufficient documentation

## 2019-09-06 DIAGNOSIS — I5022 Chronic systolic (congestive) heart failure: Secondary | ICD-10-CM | POA: Diagnosis not present

## 2019-09-06 DIAGNOSIS — E785 Hyperlipidemia, unspecified: Secondary | ICD-10-CM | POA: Insufficient documentation

## 2019-09-06 MED ORDER — EPLERENONE 50 MG PO TABS: 50.0000 mg | ORAL_TABLET | Freq: Every day | ORAL | 3 refills | Status: DC

## 2019-09-06 NOTE — Patient Instructions (Addendum)
It was a pleasure seeing you today!  MEDICATIONS: -We are changing your medications today -Increase your eplerenone to 50 mg once daily -Call if you have questions about your medications.  NEXT APPOINTMENT: Return to clinic in 2 weeks with PharmD.   In general, to take care of your heart failure: -Limit your fluid intake to 2 Liters (half-gallon) per day.   -Limit your salt intake to ideally 2-3 grams (2000-3000 mg) per day. -Weigh yourself daily and record, and bring that "weight diary" to your next appointment.  (Weight gain of 2-3 pounds in 1 day typically means fluid weight.) -The medications for your heart are to help your heart and help you live longer.   -Please contact us before stopping any of your heart medications.  Call the clinic at [phone] with questions or to reschedule future appointments.

## 2019-09-06 NOTE — Progress Notes (Signed)
PCP: [name], MD PCP-Cardiologist: [name], MD  [facility]: Dr. [name]   HPI:  64 y/o smoker (originally from [place], [place]) with h/o CAD s/p previous MI, HTN, HL admitted 04/27/19 with several days of stuttering CP. Initial ECG showed anterior ST elevation. HsTrop 476 -> >27k.Taken to cath lab which showed chronically occluded RCA (L>>R collaterals) and thrombotic occlusion of proximal LAD. Underwent PCI of LAD.Echo w/ reduced LVEF 30-35% w/ apical aneurysm. RV ok. Post cath, he required milrinone for cardiogenic shock. Diuresed w/ IV Lasix. Milrinone ultimately weaned off and Co-ox remained stable off milrinone at 69%. GDMT limited by hypotension. He required midodrine 5 mg tid. Was continued on eplerenone 12.5 mg daily (on prior to admit), digoxin added. Discharged home on lasix 20 mg daily. No BP room for ARB nor ? blocker. For his CAD, he was placed on DAPT w/ ASA and Brilinta + high intensity statin, Lipitor 40 mg. He was discharged home on 05/04/19.  At subsequent visits, his HF regimen was titrated. On GDMT. Now on Entresto, eplerenone, carvedilol and digoxin. Has tolerated meds well. His Brilinta was switched to Prasugrel due to cost.  Today he returns to HF clinic for pharmacist medication titration. At last visit with Dr. [name] on 08/09/19, repeat ECHO showed EF of 25%. He was referred to EP for ICD, which was implanted on 08/31/19 and his Prasugrel was switched to clopidogrel to reduce the risk for bleeding.  Overall, he says he is feeling pretty good. He denies dizziness, lightheadedness, chest pain or palpitations. He says he feels some fatigue but has been trying to sleep more (7-8 hours per night). His breathing is "not bad" but still has 2-3 episodes of shortness of breath per day, but he says this is an improvement to what it had been before. He is still able to complete all ADLs but reports reduced activity level. He says this is because he has been feeling down lately  due to a decline in his short term memory over the last several months. He weighs himself at home daily and reports it has been stable around 193 lbs. He takes furosemide 40 mg PRN for edema and reports taking doses about twice per week. No PND/orthopnea. His appetite has been less than normal but is improving. He also mentions prior to his hospitalization in December, he had been taking eplerenone 50 mg BID prescribed from a retinal specialist, Dr. Zigmund Daniel.    . Shortness of breath/dyspnea on exertion? yes  . Orthopnea/PND? no . Edema? no . Lightheadedness/dizziness? no . Daily weights at home? yes . Blood pressure/heart rate monitoring at home? Yes - 110/70s . Following low-sodium/fluid-restricted diet? yes  HF Medications: Furosemide 40 mg daily PRN  Carvedilol 3.125 mg BID Entresto 24/26 mg BID Eplerenone 25 mg daily  Digoxin 0.125 mg daily  Has the patient been experiencing any side effects to the medications prescribed?  no  Does the patient have any problems obtaining medications due to transportation or finances?   No - has Medicare and has reached deductible for this year. Patient is on waitlist for [employer] HF fund.   Understanding of regimen: good Understanding of indications: fair Potential of compliance: good Patient understands to avoid NSAIDs. Patient understands to avoid decongestants.    Pertinent Lab Values (09/01/19): [name] Serum creatinine 0.93, BUN 20, Potassium 4.0, Sodium 136, Digoxin 0.5  Vital Signs: . Weight: 194 lbs (last clinic weight: 195 lbs) . Blood pressure: 110/72 mmHg  . Heart rate: 95 bpm  Assessment: 1. CAD: s/p anterior STEMI 12/20. LHC showed chronically occluded RCA (with L>>R collaterals) and thrombotic occlusion of proximal LAD. Underwent PCI of LAD. - No further angina.   - continue DAPT w/ ASA + clopidogrel for a minimum of 12 months - continue high intensity statin + Zetia. LDL goal < 70 (79 mg/dL 12/20).  - Continue carvedilol  3.125 mg BID - Continue cardiac rehab.   2.  Chronic systolic heart failure/ischemic cardiomyopathy (EF 25-30%):  04/27/19 ECHO - EF post anterior MI 30 to 35%. Repeat ECHO 08/09/19 - EF further reduced to 25-30% NYHA II. Euvolemic on exam.  -Vitals: BP 110/72, HR 95 -Labs: SCr 0.93, K 4.0 -Continue furosemide 40 mg daily PRN -Continue carvedilol 3.125 mg BID -Continue Entresto 24/26 mg BID -Increase eplerenone to 50 mg daily - repeat BMET in 2 weeks -Continue digoxin 0.125 mg daily (last level 0.5 ng/mL on 08/09/19) -Discussed adding Farxiga at next visit. Pt is willing to start therapy at that time. He is on the waitlist for the [employer].  3.  Tobacco abuse: Former smoker.  Recently quit after myocardial infarction.   - continues to refrain from tobacco use. He was congratulated on efforts.    4.  Hyperlipidemia: Recent LDL 79 mg/dL.  Goal in the setting of known coronary disease and recent myocardial infarction is less than 70 mg/dL.  Reports full compliance with atorvastatin 80 mg and tolerating well without side effects. -now followed by Dr. Debara Pickett in [facility] and 10 mg Zetia added  - Dr. Debara Pickett to repeat lipid panel at next visit   5. ETOH  - reports he has quit drinking  6. Aortic Stenosis -Mild-mod on ECHO    Plan: 1) Medication changes: Based on clinical presentation, vital signs and recent labs will increase eplerenone to 50 mg daily. Plan on starting Farxiga at next visit.  2) Labs: BMET in 2 weeks 3) Follow-up: 2 weeks with Pharmacy Clinic, 11/10/19 with Dr. Zachary George, PharmD, BCPS Heart Failure Clinic Pharmacist [phone]  Audry Riles, PharmD, BCPS, BCCP, CPP Heart Failure Clinic Pharmacist [phone]

## 2019-09-14 ENCOUNTER — Other Ambulatory Visit: Payer: Self-pay

## 2019-09-14 ENCOUNTER — Ambulatory Visit (INDEPENDENT_AMBULATORY_CARE_PROVIDER_SITE_OTHER): Payer: Medicare Other | Admitting: *Deleted

## 2019-09-14 DIAGNOSIS — I255 Ischemic cardiomyopathy: Secondary | ICD-10-CM | POA: Diagnosis not present

## 2019-09-14 LAB — CUP PACEART INCLINIC DEVICE CHECK
Battery Remaining Longevity: 103 mo
Battery Remaining Longevity: 103 mo
Brady Statistic RV Percent Paced: 0 %
Brady Statistic RV Percent Paced: 0 %
Date Time Interrogation Session: 20210504111100
Date Time Interrogation Session: 20210504111100
HighPow Impedance: 69.75 Ohm
HighPow Impedance: 69.75 Ohm
Implantable Lead Implant Date: 20210420
Implantable Lead Implant Date: 20210420
Implantable Lead Location: 753860
Implantable Lead Location: 753860
Implantable Pulse Generator Implant Date: 20210420
Implantable Pulse Generator Implant Date: 20210420
Lead Channel Impedance Value: 462.5 Ohm
Lead Channel Impedance Value: 462.5 Ohm
Lead Channel Pacing Threshold Amplitude: 0.5 V
Lead Channel Pacing Threshold Amplitude: 0.5 V
Lead Channel Pacing Threshold Pulse Width: 0.5 ms
Lead Channel Pacing Threshold Pulse Width: 0.5 ms
Lead Channel Sensing Intrinsic Amplitude: 11.6 mV
Lead Channel Sensing Intrinsic Amplitude: 11.6 mV
Lead Channel Setting Pacing Amplitude: 3.5 V
Lead Channel Setting Pacing Amplitude: 3.5 V
Lead Channel Setting Pacing Pulse Width: 0.5 ms
Lead Channel Setting Pacing Pulse Width: 0.5 ms
Lead Channel Setting Sensing Sensitivity: 0.5 mV
Lead Channel Setting Sensing Sensitivity: 0.5 mV
Pulse Gen Serial Number: 111013950
Pulse Gen Serial Number: 111013950

## 2019-09-14 NOTE — Progress Notes (Addendum)
Wound check appointment. Steri-strips removed. Wound without redness or edema. Incision edges approximated, wound well healed. Normal device function. Thresholds, sensing, and impedances consistent with implant measurements. Device programmed at 3.5V for extra safety margin until 3 month visit. Histogram distribution appropriate for patient and level of activity. No mode switches. 1 ventricular arrhythmia noted with EGM that showed episode of NSVT that was 11 beats in duration.. Patient educated about wound care, arm mobility, lifting restrictions, shock plan. ROV with Dr Rayann Heman 12/06/19. Enrolled in remote follow up and next remote scheduled for 12/01/19.

## 2019-09-15 ENCOUNTER — Telehealth (HOSPITAL_COMMUNITY): Payer: Self-pay

## 2019-09-15 NOTE — Telephone Encounter (Signed)
Virtual Cardiac Rehab Note:  Successful telephone encounter to [name] to follow up on VCR plan of care and recent hospitalization for ICD placement. EMR reviewed. Patient states he is doing well. ICD site healing well. Patient is scheduled to graduate from the [employer] [facility] VCR app 10/01/19. He is scheduled for a follow up assessment and post program 6 min walk test 09/28/19 at 9:00. Homework mailed.  Ronika Kelson E. Rollene Rotunda RN, BSN [place]. [facility]  Cardiac and Pulmonary Rehabilitation Phone: [phone] Fax: [phone]

## 2019-09-21 ENCOUNTER — Ambulatory Visit (HOSPITAL_COMMUNITY)
Admission: RE | Admit: 2019-09-21 | Discharge: 2019-09-21 | Disposition: A | Discharge status: Home / Self Care | Payer: Medicare Other | Source: Ambulatory Visit | Attending: Cardiology | Admitting: Cardiology

## 2019-09-21 ENCOUNTER — Other Ambulatory Visit: Payer: Self-pay

## 2019-09-21 ENCOUNTER — Encounter (HOSPITAL_COMMUNITY): Payer: Self-pay

## 2019-09-21 VITALS — BP 114/74 | HR 83 | Ht 72.0 in | Wt 194.6 lb

## 2019-09-21 DIAGNOSIS — Z79899 Other long term (current) drug therapy: Secondary | ICD-10-CM | POA: Insufficient documentation

## 2019-09-21 DIAGNOSIS — I110 Hypertensive heart disease with heart failure: Secondary | ICD-10-CM | POA: Diagnosis not present

## 2019-09-21 DIAGNOSIS — Z7901 Long term (current) use of anticoagulants: Secondary | ICD-10-CM | POA: Insufficient documentation

## 2019-09-21 DIAGNOSIS — I255 Ischemic cardiomyopathy: Secondary | ICD-10-CM | POA: Insufficient documentation

## 2019-09-21 DIAGNOSIS — Z87891 Personal history of nicotine dependence: Secondary | ICD-10-CM | POA: Insufficient documentation

## 2019-09-21 DIAGNOSIS — I2510 Atherosclerotic heart disease of native coronary artery without angina pectoris: Secondary | ICD-10-CM | POA: Insufficient documentation

## 2019-09-21 DIAGNOSIS — E785 Hyperlipidemia, unspecified: Secondary | ICD-10-CM | POA: Diagnosis not present

## 2019-09-21 DIAGNOSIS — I5022 Chronic systolic (congestive) heart failure: Secondary | ICD-10-CM | POA: Diagnosis present

## 2019-09-21 DIAGNOSIS — I350 Nonrheumatic aortic (valve) stenosis: Secondary | ICD-10-CM | POA: Diagnosis not present

## 2019-09-21 DIAGNOSIS — I252 Old myocardial infarction: Secondary | ICD-10-CM | POA: Insufficient documentation

## 2019-09-21 LAB — BASIC METABOLIC PANEL
Anion gap: 9 (ref 5–15)
BUN: 11 mg/dL (ref 8–23)
CO2: 24 mmol/L (ref 22–32)
Calcium: 9.6 mg/dL (ref 8.9–10.3)
Chloride: 104 mmol/L (ref 98–111)
Creatinine, Ser: 0.77 mg/dL (ref 0.61–1.24)
GFR calc Af Amer: >60 mL/min (ref >60)
GFR calc non Af Amer: >60 mL/min (ref >60)
Glucose, Bld: 113 mg/dL — ABNORMAL HIGH (ref 70–99)
Potassium: 4 mmol/L (ref 3.5–5.1)
Sodium: 137 mmol/L (ref 135–145)

## 2019-09-21 MED ORDER — FARXIGA 10 MG PO TABS: 10.0000 mg | ORAL_TABLET | Freq: Every day | ORAL | 11 refills | Status: DC

## 2019-09-21 NOTE — Progress Notes (Signed)
PCP: [name], MD PCP-Cardiologist: [name], MD  [facility]: Dr. [name]   HPI:  64 y/o smoker (originally from [place], [place]) with h/o CAD s/p previous MI, HTN, HL admitted 04/27/19 with several days of stuttering CP. Initial ECG showed anterior ST elevation. HsTrop 476 -> >27k.Taken to cath lab which showed chronically occluded RCA (L>>R collaterals) and thrombotic occlusion of proximal LAD. Underwent PCI of LAD.Echo w/ reduced LVEF 30-35% w/ apical aneurysm. RV ok. Post cath, he required milrinone for cardiogenic shock. Diuresed w/ IV furosemide. Milrinone ultimately weaned off and Co-ox remained stable off milrinone at 69%. GDMT limited by hypotension. He required midodrine 5 mg tid. Was continued on eplerenone 12.5 mg daily (on prior to admit), digoxin added. Discharged home on lasix 20 mg daily. No BP room for ARB nor ? blocker. For his CAD, he was placed on DAPT w/ ASA and Brilinta + high intensity statin, Lipitor 40 mg. He was discharged home on 05/04/19.  At subsequent visits, his HF regimen was titrated. On GDMT. Now on Entresto, eplerenone, carvedilol and digoxin. Has tolerated meds well. His Brilinta was switched to Prasugrel due to cost.  At last visit with Dr. [name] on 08/09/19, repeat ECHO showed EF of 25%. He was referred to EP for ICD, which was implanted on 08/31/19 and his Prasugrel was switched to clopidogrel to reduce the risk for bleeding. He denied dizziness, lightheadedness, chest pain or palpitations. He stated he felt some fatigue but had been trying to sleep more (7-8 hours per night). His breathing was "not bad" but still had 2-3 episodes of shortness of breath per day, but he said this is an improvement to what it had been before. He was still able to complete all ADLs but reported reduced activity level. He stated this is because he had been feeling down lately due to a decline in his short term memory over the last several months. He weighed himself at home daily  and reported it had been stable around 193 lbs. He was taking furosemide 40 mg PRN for edema and reported taking doses about twice per week. No PND/orthopnea. His appetite had been less than normal but was improving. He also mentioned prior to his hospitalization in December, he had been taking eplerenone 50 mg BID prescribed from a retinal specialist, Dr. Zigmund Daniel.   Today he returns to HF clinic for pharmacist medication titration. At last visit with pharmacy clinic, eplerenone was increased to 50 mg daily. Overall, he is doing well symptomatically. He denies dizziness, lightheadedness, fatigue. Denies chest pain or palpitations. He endorses that he has trouble sleeping on his back due to back pain which he mentions is unrelated to shortness of breath from his heart failure. He is able to breath fine and completes all his ADLs without issue. He is moderately active (walks daily). He weighs himself at home (normal range 193-195 lbs). He uses furosemide 40mg  daily PRN (~1 time per week) to help with mild shortness of breath when he lays down. No LEE or PND/Orthopnea. His appetite is improving and he adheres to a low salt diet.  HF Medications:  Carvedilol 3.125 mg BID Entresto 24/26 mg BID Eplerenone 50 mg daily  Digoxin 0.125 mg daily Furosemide 40 mg daily PRN  Has the patient been experiencing any side effects to the medications prescribed?  no  Does the patient have any problems obtaining medications due to transportation or finances?   No - has Medicare and has reached deductible for this year. Patient  is on waitlist for [employer] HF fund.   Understanding of regimen: good Understanding of indications: fair Potential of compliance: good Patient understands to avoid NSAIDs. Patient understands to avoid decongestants.    Pertinent Lab Values (09/21/2019): [name] Serum creatinine 0.77, BUN 11, Potassium 4.0, Sodium 137, Digoxin 0.5 ng/mL (08/09/19)  Vital Signs: . Weight: 194.6 lbs (last  clinic weight: 194 lbs) . Blood pressure: 114/74 mmHg  . Heart rate: 83 bpm   Assessment: 1. CAD: s/p anterior STEMI 12/20. LHC showed chronically occluded RCA (with L>>R collaterals) and thrombotic occlusion of proximal LAD. Underwent PCI of LAD. - No further angina.   - Continue DAPT w/ ASA + clopidogrel for a minimum of 12 months - Continue high intensity statin + Zetia. LDL goal < 70 (86 mg/dL 01/21).  - Continue carvedilol 3.125 mg BID - Continue cardiac rehab  2.  Chronic systolic heart failure/ischemic cardiomyopathy (EF 25-30%):  04/27/19 ECHO - EF post anterior MI 30 to 35%. Repeat ECHO 08/09/19 - EF further reduced to 25-30% -NYHA II. euvolemic on exam.  -Vitals: BP 114/74, HR 83 -Labs: SCr 0.77, K 4.0 -Continue furosemide 40 mg daily PRN -Continue carvedilol 3.125 mg BID -Continue Entresto 24/26 mg BID -Continue eplerenone 50 mg daily  -Start dapagliflozin (Farxiga) 10 mg daily; Gave 30 day-free card; He is on the waitlist for the [employer]. Check BMET in 3 weeks. -Continue digoxin 0.125 mg daily (last level 0.5 ng/mL on 08/09/19)  3.  Tobacco abuse: Former smoker. Recently quit after myocardial infarction.   -Continues to refrain from tobacco use. He was congratulated on efforts.    4.  Hyperlipidemia: Recent LDL 86 mg/dL (05/2019) Goal in the setting of known coronary disease and recent myocardial infarction is less than 70 mg/dL. Reports full compliance with atorvastatin 80 mg and tolerating well without side effects. -Now followed by Dr. Debara Pickett in [facility] and 10 mg Zetia added   5. ETOH  - Reports he has quit drinking  6. Aortic Stenosis -Mild-mod on ECHO    Plan: 1) Medication changes: Based on clinical presentation, vital signs and recent labs will start Farxiga 10 mg daily. Repeat BMET in 3 weeks. 2) Labs: Scr 0.77, K 4.0 3) Follow-up: 3 weeks for labs only & 7 weeks with Dr. Angela Nevin, PharmD PGY1 [facility] Pharmacy  Resident  Audry Riles, PharmD, BCPS, [facility], CPP Heart Failure Clinic Pharmacist [phone]

## 2019-09-21 NOTE — Patient Instructions (Addendum)
It was a pleasure seeing you today!  MEDICATIONS: -We are changing your medications today -Start dapagliflozin Wilder Glade) 1 tablet (10mg ) by mouth daily -Call if you have questions about your medications.  LABS: -We will call you if your labs need attention.   NEXT APPOINTMENT: Return to clinic in 3 weeks for lab check and 7 weeks with Dr. [name].  In general, to take care of your heart failure: -Limit your fluid intake to 2 Liters (half-gallon) per day.   -Limit your salt intake to ideally 2-3 grams (2000-3000 mg) per day. -Weigh yourself daily and record, and bring that "weight diary" to your next appointment.  (Weight gain of 2-3 pounds in 1 day typically means fluid weight.) -The medications for your heart are to help your heart and help you live longer.   -Please contact us before stopping any of your heart medications.  Call the clinic at [phone] with questions or to reschedule future appointments.

## 2019-09-24 LAB — LIPID PANEL
Chol/HDL Ratio: 3.1 ratio (ref 0.0–5.0)
Cholesterol, Total: 150 mg/dL (ref 100–199)
HDL: 48 mg/dL (ref >39)
LDL Chol Calc (NIH): 81 mg/dL (ref 0–99)
Triglycerides: 117 mg/dL (ref 0–149)
VLDL Cholesterol Cal: 21 mg/dL (ref 5–40)

## 2019-09-28 ENCOUNTER — Other Ambulatory Visit: Payer: Self-pay

## 2019-09-28 ENCOUNTER — Encounter (HOSPITAL_COMMUNITY): Payer: Self-pay

## 2019-09-28 ENCOUNTER — Encounter (HOSPITAL_COMMUNITY)
Admission: RE | Admit: 2019-09-28 | Discharge: 2019-09-28 | Disposition: A | Discharge status: Home / Self Care | Payer: Self-pay | Source: Ambulatory Visit | Attending: Cardiology | Admitting: Cardiology

## 2019-09-28 VITALS — BP 100/68 | HR 88 | Resp 18 | Ht 72.75 in | Wt 191.1 lb

## 2019-09-28 DIAGNOSIS — I2102 ST elevation (STEMI) myocardial infarction involving left anterior descending coronary artery: Secondary | ICD-10-CM | POA: Insufficient documentation

## 2019-09-28 DIAGNOSIS — Z955 Presence of coronary angioplasty implant and graft: Secondary | ICD-10-CM | POA: Insufficient documentation

## 2019-09-28 NOTE — Progress Notes (Signed)
Discharge Progress Report  Patient Details  Name: Brandon [name] MRN: [id number] Date of Birth: [birth year] Referring Provider:     [place] from 05/27/2019 in [place]  Referring Provider  [name], MD       Number of Visits: 05/27/19-10/01/19 VCR   Reason for Discharge:  Patient reached a stable level of exercise. Patient independent in their exercise. Patient has met program and personal goals.  Smoking History:  Social History   Tobacco Use  Smoking Status Former Smoker  . Packs/day: 0.50  . Years: 40.00  . Pack years: 20.00  . Types: Cigarettes  . Quit date: 07/26/2019  . Years since quitting: 0.1  Smokeless Tobacco Never Used    Diagnosis:  S/P coronary artery stent placement S/P DES LAD 04/27/19  ST elevation myocardial infarction involving left anterior descending (LAD) coronary artery ([place]) 04/27/19  ADL UCSD:   Initial Exercise Prescription: Initial Exercise Prescription - 05/28/19 0900      Date of Initial Exercise RX and Referring Provider   Date  05/27/19    Referring Provider  [name], MD      Treadmill   Minutes  10      Track   Minutes  10      Prescription Details   Frequency (times per week)  5-7    Duration  Progress to 30 minutes of continuous aerobic without signs/symptoms of physical distress      Intensity   THRR 40-80% of Max Heartrate  62-124    Ratings of Perceived Exertion  11-13    Perceived Dyspnea  0-4      Progression   Progression  Continue to progress workloads to maintain intensity without signs/symptoms of physical distress.      Resistance Training   Training Prescription  Yes    Reps  10-15       Discharge Exercise Prescription (Final Exercise Prescription Changes): Exercise Prescription Changes - 06/19/19 1322      Response to Exercise   Heart Rate (Admit)  87 bpm    Heart Rate (Exercise)  105 bpm    Rating of Perceived Exertion  (Exercise)  11    Symptoms  none    Comments  Virtual cardiac rehab session    Duration  Progress to 45 minutes of aerobic exercise without signs/symptoms of physical distress    Intensity  THRR unchanged      Progression   Progression  Continue to progress workloads to maintain intensity without signs/symptoms of physical distress.      Interval Training   Interval Training  No      Treadmill   Minutes  --      Track   Minutes  40      Home Exercise Plan   Plans to continue exercise at  Home (comment)   Walking   Initial Home Exercises Provided  05/27/19       Functional Capacity: 6 Minute Walk    Row Name 05/27/19 1248 09/28/19 0919       6 Minute Walk   Phase  Initial  Discharge    Distance  1104 feet  1269 feet    Distance % Change  --  14.95 %    Walk Time  6 minutes  6 minutes    # of Rest Breaks  0  0    MPH  2.09  2.4    METS  2.92  3.12  RPE  15  12    Perceived Dyspnea   1  1    VO2 Peak  --  10.93    Symptoms  Yes (comment)  Yes (comment)    Comments  Patient c/o bilateral hip pain "6/10" on the pain scale, which is chronic for him. Pt c/o mild shortness of breath.  Patient c/o bilateral hip pain "4/10" on the pain scale, which is chronic for him. Pt c/o mild shortness of breath.    Resting HR  92 bpm  88 bpm    Resting BP  114/62  100/68    Resting Oxygen Saturation   98 %  98 %    Exercise Oxygen Saturation  during 6 min walk  94 %  98 %    Max Ex. HR  114 bpm  105 bpm    Max Ex. BP  102/72  102/68    2 Minute Post BP  100/64  104/71       Psychological, QOL, Others - Outcomes: PHQ 2/9: Depression screen [facility] 2/9 09/28/2019 04/15/2019 12/08/2014 03/29/2013  Decreased Interest 0 0 0 0  Down, Depressed, Hopeless 0 0 0 0  PHQ - 2 Score 0 0 0 0    Quality of Life: Quality of Life - 09/29/19 0921      Quality of Life   Select  Quality of Life      Quality of Life Scores   Health/Function Pre  10.1 %    Health/Function Post  14.23 %     Health/Function % Change  40.89 %    Socioeconomic Pre  21.86 %    Socioeconomic Post  17.21 %    Socioeconomic % Change   -21.27 %    Psych/Spiritual Pre  10.08 %    Psych/Spiritual Post  13.21 %    Psych/Spiritual % Change  31.05 %    Family Pre  17.5 %    Family Post  17 %    Family % Change  -2.86 %    GLOBAL Pre  13.19 %    GLOBAL Post  14.78 %    GLOBAL % Change  12.05 %       Personal Goals: Goals established at orientation with interventions provided to work toward goal. Personal Goals and Risk Factors at Admission - 05/28/19 1104      Core Components/Risk Factors/Patient Goals on Admission    Weight Management  Weight Maintenance;Yes    Intervention  Weight Management: Develop a combined nutrition and exercise program designed to reach desired caloric intake, while maintaining appropriate intake of nutrient and fiber, sodium and fats, and appropriate energy expenditure required for the weight goal.;Weight Management: Provide education and appropriate resources to help participant work on and attain dietary goals.;Weight Management/Obesity: Establish reasonable short term and long term weight goals.    Tobacco Cessation  Yes   Terrian quit on 04/27/19 and remains tobacco free   Intervention  Advice worker, assist with locating and accessing local/national Quit Smoking programs, and support quit date choice.    Expected Outcomes  Long Term: Complete abstinence from all tobacco products for at least 12 months from quit date.    Heart Failure  Yes    Intervention  Provide a combined exercise and nutrition program that is supplemented with education, support and counseling about heart failure. Directed toward relieving symptoms such as shortness of breath, decreased exercise tolerance, and extremity edema.    Expected Outcomes  Improve functional capacity of  life;Short term: Daily weights obtained and reported for increase. Utilizing diuretic protocols set by  physician.;Short term: Attendance in program 2-3 days a week with increased exercise capacity. Reported lower sodium intake. Reported increased fruit and vegetable intake. Reports medication compliance.;Long term: Adoption of self-care skills and reduction of barriers for early signs and symptoms recognition and intervention leading to self-care maintenance.    Hypertension  Yes    Intervention  Provide education on lifestyle modifcations including regular physical activity/exercise, weight management, moderate sodium restriction and increased consumption of fresh fruit, vegetables, and low fat dairy, alcohol moderation, and smoking cessation.;Monitor prescription use compliance.    Expected Outcomes  Short Term: Continued assessment and intervention until BP is < 140/3m HG in hypertensive participants. < 130/826mHG in hypertensive participants with diabetes, heart failure or chronic kidney disease.;Long Term: Maintenance of blood pressure at goal levels.    Lipids  Yes    Intervention  Provide education and support for participant on nutrition & aerobic/resistive exercise along with prescribed medications to achieve LDL <7033mHDL >35m88m  Expected Outcomes  Short Term: Participant states understanding of desired cholesterol values and is compliant with medications prescribed. Participant is following exercise prescription and nutrition guidelines.;Long Term: Cholesterol controlled with medications as prescribed, with individualized exercise RX and with personalized nutrition plan. Value goals: LDL < 70mg63mL > 40 mg.    Stress  Yes    Intervention  Offer individual and/or small group education and counseling on adjustment to heart disease, stress management and health-related lifestyle change. Teach and support self-help strategies.;Refer participants experiencing significant psychosocial distress to appropriate mental health specialists for further evaluation and treatment. When possible, include  family members and significant others in education/counseling sessions.    Expected Outcomes  Short Term: Participant demonstrates changes in health-related behavior, relaxation and other stress management skills, ability to obtain effective social support, and compliance with psychotropic medications if prescribed.;Long Term: Emotional wellbeing is indicated by absence of clinically significant psychosocial distress or social isolation.        Personal Goals Discharge: Goals and Risk Factor Review    Row Name 10/01/19 1017             Core Components/Risk Factors/Patient Goals Review   Expected Outcomes  Patient will continue his home exercise and lifestyle modifications post discharge from the V[facility] program. He will download the [employer] app for continued documentation of activity and viatls.          Exercise Goals and Review: Exercise Goals    Row Name 05/27/19 1234             Exercise Goals   Increase Physical Activity  Yes       Intervention  Develop an individualized exercise prescription for aerobic and resistive training based on initial evaluation findings, risk stratification, comorbidities and participant's personal goals.;Provide advice, education, support and counseling about physical activity/exercise needs.       Expected Outcomes  Short Term: Attend rehab on a regular basis to increase amount of physical activity.;Long Term: Exercising regularly at least 3-5 days a week.;Long Term: Add in home exercise to make exercise part of routine and to increase amount of physical activity.       Increase Strength and Stamina  Yes       Intervention  Provide advice, education, support and counseling about physical activity/exercise needs.;Develop an individualized exercise prescription for aerobic and resistive training based on initial evaluation findings, risk stratification, comorbidities and participant's personal  goals.       Expected Outcomes  Short Term: Increase workloads  from initial exercise prescription for resistance, speed, and METs.;Short Term: Perform resistance training exercises routinely during rehab and add in resistance training at home;Long Term: Improve cardiorespiratory fitness, muscular endurance and strength as measured by increased METs and functional capacity (6MWT)       Able to understand and use rate of perceived exertion (RPE) scale  Yes       Intervention  Provide education and explanation on how to use RPE scale       Expected Outcomes  Short Term: Able to use RPE daily in rehab to express subjective intensity level;Long Term:  Able to use RPE to guide intensity level when exercising independently       Knowledge and understanding of Target Heart Rate Range (THRR)  Yes       Intervention  Provide education and explanation of THRR including how the numbers were predicted and where they are located for reference       Expected Outcomes  Short Term: Able to state/look up THRR;Long Term: Able to use THRR to govern intensity when exercising independently;Short Term: Able to use daily as guideline for intensity in rehab       Able to check pulse independently  Yes       Intervention  Review the importance of being able to check your own pulse for safety during independent exercise;Provide education and demonstration on how to check pulse in carotid and radial arteries.       Expected Outcomes  Short Term: Able to explain why pulse checking is important during independent exercise;Long Term: Able to check pulse independently and accurately       Understanding of Exercise Prescription  Yes       Intervention  Provide education, explanation, and written materials on patient's individual exercise prescription       Expected Outcomes  Short Term: Able to explain program exercise prescription;Long Term: Able to explain home exercise prescription to exercise independently          Exercise Goals Re-Evaluation: Exercise Goals Re-Evaluation    Row Name  06/21/19 1323 09/28/19 0938           Exercise Goal Re-Evaluation   Exercise Goals Review  --  Increase Physical Activity;Understanding of Exercise Prescription;Increase Strength and Stamina;Knowledge and understanding of Target Heart Rate Range (THRR);Able to understand and use rate of perceived exertion (RPE) scale;Able to check pulse independently      Comments  Patient has been actively participating in the virtual cardiac rehab program via the [employer] app and has been progressing well. Patient is walking 20 minutes, 2-3 times every day for a total of 40-60 minutes daily.  Patient completed the virtual cardiac rehab program and progressed well.Patient's functional capacity increased 15% as measured by 6MWT and strength increased 36% as measured by grip strength test. Patient feels that his SOB and feelings of weakness have decreased and overall he sees improvement in how he feels. Patient plans to continue walking at least 20 minutes daily.      Expected Outcomes  Patient will transition to the onsite cardiac rehab program.  Patient will continue daily walking routine to maintain health and fitness gains.         Nutrition & Weight - Outcomes: Pre Biometrics - 05/27/19 1225      Pre Biometrics   Height  6' 0.75" (1.848 m)    Weight  88.7 kg  Waist Circumference  42 inches    Hip Circumference  39.25 inches    Waist to Hip Ratio  1.07 %    BMI (Calculated)  25.97    Triceps Skinfold  5 mm    % Body Fat  23 %    Grip Strength  35 kg    Single Leg Stand  2.68 seconds      Post Biometrics - 09/28/19 0924       Post  Biometrics   Height  6' 0.75" (1.848 m)    Waist Circumference  40.25 inches    Hip Circumference  40 inches    Waist to Hip Ratio  1.01 %    BMI (Calculated)  25.39    Triceps Skinfold  8 mm    % Body Fat  23.8 %    Grip Strength  47.5 kg    Single Leg Stand  4.68 seconds       Nutrition:   Nutrition Discharge: Nutrition Assessments - 09/30/19 0929       MEDFICTS Scores   Post Score  38       Education Questionnaire Score: Knowledge Questionnaire Score - 09/28/19 0946      Knowledge Questionnaire Score   Pre Score  25/28    Post Score  23/24       Goals reviewed with patient; copy given to patient.

## 2019-09-30 ENCOUNTER — Encounter: Payer: Self-pay | Admitting: Internal Medicine

## 2019-09-30 ENCOUNTER — Other Ambulatory Visit: Payer: Self-pay

## 2019-09-30 ENCOUNTER — Ambulatory Visit (INDEPENDENT_AMBULATORY_CARE_PROVIDER_SITE_OTHER): Payer: Medicare Other | Admitting: Internal Medicine

## 2019-09-30 VITALS — BP 130/65 | HR 81 | Temp 97.0°F | Ht 72.0 in | Wt 193.6 lb

## 2019-09-30 DIAGNOSIS — I5022 Chronic systolic (congestive) heart failure: Secondary | ICD-10-CM

## 2019-09-30 DIAGNOSIS — I255 Ischemic cardiomyopathy: Secondary | ICD-10-CM

## 2019-09-30 DIAGNOSIS — E785 Hyperlipidemia, unspecified: Secondary | ICD-10-CM | POA: Diagnosis not present

## 2019-09-30 NOTE — Patient Instructions (Signed)
Medication Instructions:  Your physician recommends that you continue on your current medications as directed. Please refer to the Current Medication list given to you today.  *If you need a refill on your cardiac medications before your next appointment, please call your pharmacy*   Lab Work: FASTING lipid panel in 6 months to check cholesterol   If you have labs (blood work) drawn today and your tests are completely normal, you will receive your results only by: [name] MyChart Message (if you have MyChart) OR . A paper copy in the mail If you have any lab test that is abnormal or we need to change your treatment, we will call you to review the results.   Testing/Procedures: NONE   Follow-Up: At [facility], you and your health needs are our priority.  As part of our continuing mission to provide you with exceptional heart care, we have created designated Provider Care Teams.  These Care Teams include your primary Cardiologist (physician) and Advanced Practice Providers (APPs -  Physician Assistants and Nurse Practitioners) who all work together to provide you with the care you need, when you need it.  We recommend signing up for the patient portal called "MyChart".  Sign up information is provided on this After Visit Summary.  MyChart is used to connect with patients for Virtual Visits (Telemedicine).  Patients are able to view lab/test results, encounter notes, upcoming appointments, etc.  Non-urgent messages can be sent to your provider as well.   To learn more about what you can do with MyChart, go to [email or web address].    Your next appointment:   6 month(s)  The format for your next appointment:   Either In Person or Virtual  Provider:   Raliegh Ip Mali Hilty, MD   Other Instructions

## 2019-10-02 ENCOUNTER — Encounter: Payer: Self-pay | Admitting: Internal Medicine

## 2019-10-02 NOTE — Progress Notes (Signed)
LIPID CLINIC CONSULT NOTE  Chief Complaint:  Manage dyslipidemia  Primary Care Physician: Brandon Ruddy, MD  Primary Cardiologist:  Brandon Ruths, MD  HPI:  Brandon [name] is a 64 y.o. male who is being seen today for the evaluation of dyslipidemia at the request of Brandon Ruddy, MD.  This is a pleasant 64 year old male kindly referred from the heart failure clinic for management of dyslipidemia.  Unfortunately he has suffered an MI with a delayed presentation and evidence of cardiogenic shock.  LVEF is 30 to 35%.  He required intensive care but is made significant improvement.  He is also made major lifestyle changes.  He says he is stop smoking and has been abstinent from alcohol for about 2 months.  He previously drank about 6-8 beers a day, most days during the week.  He said he did not have any lifestyle limiting issues with alcohol.  He is not clear of his parents cardiac history, but does not clearly have a family history of early onset heart disease.  Recently a lipid profile showed total cholesterol 152, triglycerides 154, HDL 35 and LDL 86.  He is on high potency atorvastatin 80 mg daily.  09/30/2019  Brandon [name] returns today for follow-up of lipids.  He has had a minimal improvement in his numbers with a decrease in triglycerides from 154-117 and a fall and LDL cholesterol from 86-81.  He does report compliance with the addition of his Zetia, however I suspect that his numbers are worse as he has backed off on his alcohol use.  He said he is trying to make some major lifestyle changes and it is clear that alcohol can certainly be associated with lower lipid profiles.  That being said I suspect his cholesterol went up perhaps proportionately more than the benefit of his new medication.  PMHx:  Past Medical History:  Diagnosis Date  . AICD (automatic cardioverter/defibrillator) present 08/31/2019  . Ankylosing spondylitis ([place])   . Arthritis   . BENIGN PROSTATIC  HYPERTROPHY 06/07/2008  . COLONIC POLYPS, HX OF 06/07/2008  . H/O hiatal hernia   . Heart failure ([place])   . HYPERLIPIDEMIA 06/07/2008  . HYPERTENSION 06/07/2008  . Myocardial infarction [facility]) 2005   NSTEMI, s/p LAD stent  . NEPHROLITHIASIS, HX OF 06/07/2008  . STEMI (ST elevation myocardial infarction) ([place]) 04/27/2019    Past Surgical History:  Procedure Laterality Date  . CORONARY ANGIOPLASTY WITH STENT PLACEMENT  2005   LAD stent, jailed diagonal  . CORONARY/GRAFT ACUTE MI REVASCULARIZATION N/A 04/27/2019   Procedure: Coronary/Graft Acute MI Revascularization;  Surgeon: Brandon Booze, MD;  Location: [place] CV LAB;  Service: Cardiovascular;  Laterality: N/A;  . ICD IMPLANT  08/31/2019  . ICD IMPLANT N/A 08/31/2019   Procedure: ICD IMPLANT;  Surgeon: Brandon Grayer, MD;  Location: [place] CV LAB;  Service: Cardiovascular;  Laterality: N/A;  . LAMINECTOMY     lumbar  . LEFT HEART CATH AND CORONARY ANGIOGRAPHY N/A 04/27/2019   Procedure: LEFT HEART CATH AND CORONARY ANGIOGRAPHY;  Surgeon: Brandon Booze, MD;  Location: [place] CV LAB;  Service: Cardiovascular;  Laterality: N/A;  . LUMBAR FUSION  01/2012  . RIGHT HEART CATH N/A 04/27/2019   Procedure: RIGHT HEART CATH;  Surgeon: [name], Brandon M, MD;  Location: [place] [facility] LAB;  Service: Cardiovascular;  Laterality: N/A;  . TONSILLECTOMY    . warthin tumor removal Left 2014    FAMHx:  Family History  Problem Relation  Age of Onset  . Depression Father   . Arthritis Sister        RA  . Heart failure Mother   . Other Neg Hx   . Colon cancer Neg Hx   . Esophageal cancer Neg Hx   . Rectal cancer Neg Hx   . Stomach cancer Neg Hx     SOCHx:   reports that he quit smoking about 2 months ago. His smoking use included cigarettes. He has a 20.00 pack-year smoking history. He has never used smokeless tobacco. He reports current alcohol use. He reports that he does not use drugs.  ALLERGIES:  No Known  Allergies  ROS: Pertinent items noted in HPI and remainder of comprehensive ROS otherwise negative.  HOME MEDS: Current Outpatient Medications on File Prior to Visit  Medication Sig Dispense Refill  . acetaminophen (TYLENOL) 325 MG tablet Take 1-2 tablets (325-650 mg total) by mouth every 4 (four) hours as needed for mild pain.    [name] aspirin 81 MG chewable tablet Chew 1 tablet (81 mg total) by mouth daily. 30 tablet 3  . atorvastatin (LIPITOR) 80 MG tablet TAKE 1 TABLET BY MOUTH ONCE DAILY AT 6PM. (Patient taking differently: Take 80 mg by mouth daily at 6 PM. ) 90 tablet 3  . Carboxymethylcellulose Sodium (ARTIFICIAL TEARS OP) Place 1 drop into both eyes daily as needed (dry eyes).    . carvedilol (COREG) 3.125 MG tablet Take 1 tablet (3.125 mg total) by mouth 2 (two) times daily. 180 tablet 3  . clopidogrel (PLAVIX) 75 MG tablet take 75 mg by mouth daily. 90 tablet 3  . dapagliflozin propanediol (FARXIGA) 10 MG TABS tablet Take 10 mg by mouth daily. 30 tablet 11  . digoxin (LANOXIN) 0.125 MG tablet TAKE 1 TABLET BY MOUTH DAILY 90 tablet 3  . diphenhydrAMINE (BENADRYL) 25 MG tablet Take 50 mg by mouth at bedtime as needed for sleep.    [name] eplerenone (INSPRA) 50 MG tablet Take 1 tablet (50 mg total) by mouth daily. 90 tablet 3  . ezetimibe (ZETIA) 10 MG tablet Take 1 tablet (10 mg total) by mouth daily. 90 tablet 3  . furosemide (LASIX) 20 MG tablet Take 2 tablets (40 mg total) by mouth as needed. (Patient taking differently: Take 40 mg by mouth daily as needed for fluid. ) 180 tablet 0  . inFLIXimab in sodium chloride 0.9 % Inject into the vein every 6 (six) weeks.    [name] loratadine (CLARITIN) 10 MG tablet Take 10 mg by mouth daily as needed for allergies.    . Melatonin 10 MG CAPS Take 10 mg by mouth at bedtime as needed (sleep).    . sacubitril-valsartan (ENTRESTO) 24-26 MG Take 1 tablet by mouth 2 (two) times daily. 60 tablet 3  . tamsulosin (FLOMAX) 0.4 MG CAPS capsule TAKE 1 CAPSULE(0.4 MG)  BY MOUTH DAILY (Patient taking differently: Take 0.4 mg by mouth daily. ) 90 capsule 1   No current facility-administered medications on file prior to visit.    LABS/IMAGING: No results found for this or any previous visit (from the past 48 hour(s)). No results found.  LIPID PANEL:    Component Value Date/Time   CHOL 150 09/23/2019 0851   TRIG 117 09/23/2019 0851   HDL 48 09/23/2019 0851   CHOLHDL 3.1 09/23/2019 0851   CHOLHDL 4.3 06/10/2019 1000   VLDL 31 06/10/2019 1000   LDLCALC 81 09/23/2019 0851    WEIGHTS: Wt Readings from Last 3 Encounters:  09/30/19 193 lb 9.6 oz (87.8 kg)  09/28/19 191 lb 2.2 oz (86.7 kg)  09/21/19 194 lb 9.6 oz (88.3 kg)    VITALS: BP 130/65   Pulse 81   Temp (!) 97 F (36.1 C)   Ht 6' (1.829 [name])   Wt 193 lb 9.6 oz (87.8 kg)   SpO2 94%   BMI 26.26 kg/[name]   EXAM: Deferred  EKG: Deferred  ASSESSMENT: 1. Mixed dyslipidemia, goal LDL less than 70 2. Ischemic cardiomyopathy, EF 30 to 35% 3. Recent acute anterior wall MI 4. Tobacco abuse- quit 5. Etoh use - quit  PLAN: 1.   Mr. Girton has had some improvement in his lipids and I think it will continue to improve if he continues with his lifestyle choices.  I would recommend repeating lipids and follow-up with me in 6 months.  Pixie Casino, MD, [facility], [place] Director of the Advanced Lipid Disorders &  Cardiovascular Risk Reduction Clinic Diplomate of the American Board of Clinical Lipidology Attending Cardiologist  Direct Dial: [phone]  Fax: [phone]  Website:  [email or web address] 10/02/2019, 8:46 PM

## 2019-10-04 ENCOUNTER — Telehealth (HOSPITAL_COMMUNITY): Payer: Self-pay | Admitting: Pharmacist

## 2019-10-04 NOTE — Telephone Encounter (Signed)
Submitted application for PAN heart failure fund grant. Application pending.   Audry Riles, PharmD, BCPS, BCCP, CPP Heart Failure Clinic Pharmacist [phone]

## 2019-10-12 ENCOUNTER — Ambulatory Visit (HOSPITAL_COMMUNITY)
Admission: RE | Admit: 2019-10-12 | Discharge: 2019-10-12 | Disposition: A | Discharge status: Home / Self Care | Payer: Medicare Other | Source: Ambulatory Visit | Attending: Cardiology | Admitting: Cardiology

## 2019-10-12 ENCOUNTER — Other Ambulatory Visit: Payer: Self-pay

## 2019-10-12 DIAGNOSIS — I5022 Chronic systolic (congestive) heart failure: Secondary | ICD-10-CM | POA: Diagnosis present

## 2019-10-12 LAB — BASIC METABOLIC PANEL
Anion gap: 11 (ref 5–15)
BUN: 12 mg/dL (ref 8–23)
CO2: 24 mmol/L (ref 22–32)
Calcium: 9.3 mg/dL (ref 8.9–10.3)
Chloride: 101 mmol/L (ref 98–111)
Creatinine, Ser: 0.89 mg/dL (ref 0.61–1.24)
GFR calc Af Amer: >60 mL/min (ref >60)
GFR calc non Af Amer: >60 mL/min (ref >60)
Glucose, Bld: 111 mg/dL — ABNORMAL HIGH (ref 70–99)
Potassium: 3.9 mmol/L (ref 3.5–5.1)
Sodium: 136 mmol/L (ref 135–145)

## 2019-10-13 ENCOUNTER — Ambulatory Visit (INDEPENDENT_AMBULATORY_CARE_PROVIDER_SITE_OTHER): Payer: Medicare Other | Admitting: Student

## 2019-10-13 ENCOUNTER — Encounter: Payer: Self-pay | Admitting: Student

## 2019-10-13 VITALS — BP 98/60 | HR 72 | Ht 72.0 in | Wt 191.0 lb

## 2019-10-13 DIAGNOSIS — I255 Ischemic cardiomyopathy: Secondary | ICD-10-CM | POA: Diagnosis not present

## 2019-10-13 DIAGNOSIS — I5022 Chronic systolic (congestive) heart failure: Secondary | ICD-10-CM

## 2019-10-13 DIAGNOSIS — I2510 Atherosclerotic heart disease of native coronary artery without angina pectoris: Secondary | ICD-10-CM | POA: Diagnosis not present

## 2019-10-13 DIAGNOSIS — I10 Essential (primary) hypertension: Secondary | ICD-10-CM | POA: Diagnosis not present

## 2019-10-13 DIAGNOSIS — R570 Cardiogenic shock: Secondary | ICD-10-CM | POA: Diagnosis not present

## 2019-10-13 NOTE — Patient Instructions (Addendum)
Medication Instructions:  none *If you need a refill on your cardiac medications before your next appointment, please call your pharmacy*   Lab Work:  TODAY PRO BNP BMET If you have labs (blood work) drawn today and your tests are completely normal, you will receive your results only by: [name] MyChart Message (if you have MyChart) OR . A paper copy in the mail If you have any lab test that is abnormal or we need to change your treatment, we will call you to review the results.   Testing/Procedures:   none   Follow-Up:  To Be Determined At [facility], you and your health needs are our priority.  As part of our continuing mission to provide you with exceptional heart care, we have created designated Provider Care Teams.  These Care Teams include your primary Cardiologist (physician) and Advanced Practice Providers (APPs -  Physician Assistants and Nurse Practitioners) who all work together to provide you with the care you need, when you need it.    Other Instructions Remote monitoring is used to monitor your ICD from home. This monitoring reduces the number of office visits required to check your device to one time per year. It allows [place] to keep an eye on the functioning of your device to ensure it is working properly. You are scheduled for a device check from home on 12/01/19. You may send your transmission at any time that day. If you have a wireless device, the transmission will be sent automatically. After your physician reviews your transmission, you will receive a postcard with your next transmission date.

## 2019-10-13 NOTE — Progress Notes (Signed)
Electrophysiology Office Note Date: 10/13/2019  ID:  DELOYD [name], DOB August 14, 1955, MRN [id number]  PCP: [name], MD Primary Cardiologist: [name], MD Electrophysiologist: [name], MD   CC: Routine ICD follow-up  Brandon [name] is a 64 y.o. male seen today for [name], MD for barostim consideration.  Since last being seen in our clinic the patient reports doing well. He is getting used to having the ICD. He denies shocks. He has SOB with moderate exertion, but is able to do his ADLs without difficulty. He has low blood pressure at baseline, but denies lightheadedness or dizziness.     Device History: St. Jude Single Chamber ICD implanted 08/2019 for chronic systolic CHF/primary prevention History of appropriate therapy: No History of AAD therapy: No   Past Medical History:  Diagnosis Date  . AICD (automatic cardioverter/defibrillator) present 08/31/2019  . Ankylosing spondylitis ([place])   . Arthritis   . BENIGN PROSTATIC HYPERTROPHY 06/07/2008  . COLONIC POLYPS, HX OF 06/07/2008  . H/O hiatal hernia   . Heart failure ([place])   . HYPERLIPIDEMIA 06/07/2008  . HYPERTENSION 06/07/2008  . Myocardial infarction [facility]) 2005   NSTEMI, s/p LAD stent  . NEPHROLITHIASIS, HX OF 06/07/2008  . STEMI (ST elevation myocardial infarction) ([place]) 04/27/2019   Past Surgical History:  Procedure Laterality Date  . CORONARY ANGIOPLASTY WITH STENT PLACEMENT  2005   LAD stent, jailed diagonal  . CORONARY/GRAFT ACUTE MI REVASCULARIZATION N/A 04/27/2019   Procedure: Coronary/Graft Acute MI Revascularization;  Surgeon: [name], MD;  Location: [place] [facility];  Service: Cardiovascular;  Laterality: N/A;  . ICD IMPLANT  08/31/2019  . ICD IMPLANT N/A 08/31/2019   Procedure: ICD IMPLANT;  Surgeon: [name], MD;  Location: [place] [facility];  Service: Cardiovascular;  Laterality: N/A;  . LAMINECTOMY     lumbar  . LEFT HEART CATH AND CORONARY ANGIOGRAPHY N/A  04/27/2019   Procedure: LEFT HEART CATH AND CORONARY ANGIOGRAPHY;  Surgeon: [name], MD;  Location: [place] [facility];  Service: Cardiovascular;  Laterality: N/A;  . LUMBAR FUSION  01/2012  . RIGHT HEART CATH N/A 04/27/2019   Procedure: RIGHT HEART CATH;  Surgeon: [name], Charbel M, MD;  Location: [place] [facility];  Service: Cardiovascular;  Laterality: N/A;  . TONSILLECTOMY    . warthin tumor removal Left 2014    Current Outpatient Medications  Medication Sig Dispense Refill  . acetaminophen (TYLENOL) 325 MG tablet Take 1-2 tablets (325-650 mg total) by mouth every 4 (four) hours as needed for mild pain.    [name] aspirin 81 MG chewable tablet Chew 1 tablet (81 mg total) by mouth daily. 30 tablet 3  . atorvastatin (LIPITOR) 80 MG tablet TAKE 1 TABLET BY MOUTH ONCE DAILY AT 6PM. 90 tablet 3  . Carboxymethylcellulose Sodium (ARTIFICIAL TEARS OP) Place 1 drop into both eyes daily as needed (dry eyes).    . carvedilol (COREG) 3.125 MG tablet Take 1 tablet (3.125 mg total) by mouth 2 (two) times daily. 180 tablet 3  . clopidogrel (PLAVIX) 75 MG tablet take 75 mg by mouth daily. 90 tablet 3  . dapagliflozin propanediol (FARXIGA) 10 MG TABS tablet Take 10 mg by mouth daily. 30 tablet 11  . digoxin (LANOXIN) 0.125 MG tablet TAKE 1 TABLET BY MOUTH DAILY 90 tablet 3  . diphenhydrAMINE (BENADRYL) 25 MG tablet Take 50 mg by mouth at bedtime as needed for sleep.    [name] eplerenone (INSPRA) 50 MG tablet  Take 1 tablet (50 mg total) by mouth daily. 90 tablet 3  . ezetimibe (ZETIA) 10 MG tablet Take 1 tablet (10 mg total) by mouth daily. 90 tablet 3  . furosemide (LASIX) 20 MG tablet Take 2 tablets (40 mg total) by mouth as needed. 180 tablet 0  . inFLIXimab in sodium chloride 0.9 % Inject into the vein every 6 (six) weeks.    [name] loratadine (CLARITIN) 10 MG tablet Take 10 mg by mouth daily as needed for allergies.    . Melatonin 10 MG CAPS Take 10 mg by mouth at bedtime as needed (sleep).    .  sacubitril-valsartan (ENTRESTO) 24-26 MG Take 1 tablet by mouth 2 (two) times daily. 60 tablet 3  . tamsulosin (FLOMAX) 0.4 MG CAPS capsule TAKE 1 CAPSULE(0.4 MG) BY MOUTH DAILY 90 capsule 1   No current facility-administered medications for this visit.    Allergies:   Patient has no known allergies.   Social History: Social History   Socioeconomic History  . Marital status: Single    Spouse name: Not on file  . Number of children: Not on file  . Years of education: Not on file  . Highest education level: Not on file  Occupational History  . 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 Smoking status: Former Smoker    Packs/day: 0.50    Years: 40.00    Pack years: 20.00    Types: Cigarettes    Quit date: 07/26/2019    Years since quitting: 0.2  . Smokeless tobacco: Never Used  Substance and Sexual Activity  . Alcohol use: Yes    Comment: daily  . Drug use: No  . Sexual activity: Not on file  Other Topics Concern  . Not on file  Social History Narrative   Lives in [place] alone   Works part time for a [employer] in [place] Strain:   . Difficulty of Paying Living Expenses:   Food Insecurity: No Landscape architect  . Worried About Charity fundraiser in the Last Year: Never true  . Ran Out of Food in the Last Year: Never true  Transportation Needs: No Transportation Needs  . Lack of Transportation (Medical): No  . Lack of Transportation (Non-Medical): No  Physical Activity: Sufficiently Active  . Days of Exercise per Week: 5 days  . Minutes of Exercise per Session: 40 min  Stress:   . Feeling of Stress :   Social Connections:   . Frequency of Communication with Friends and Family:   . Frequency of Social Gatherings with Friends and Family:   . Attends Religious Services:   . Active Member of Clubs or Organizations:   . Attends Archivist Meetings:   [name] Marital  Status:   Intimate Partner Violence:   . Fear of Current or Ex-Partner:   . Emotionally Abused:   [name] Physically Abused:   . Sexually Abused:     Family History: Family History  Problem Relation Age of Onset  . Depression Father   . Arthritis Sister        RA  . Heart failure Mother   . Other Neg Hx   . Colon cancer Neg Hx   . Esophageal cancer Neg Hx   . Rectal cancer Neg Hx   . Stomach cancer Neg Hx     Review of Systems: All other systems reviewed and are otherwise  negative except as noted above.   Physical Exam: Vitals:   10/13/19 1035  BP: 98/60  Pulse: 72  SpO2: 97%  Weight: 191 lb (86.6 kg)  Height: 6' (1.829 m)     GEN- The patient is well appearing, alert and oriented x 3 today.   HEENT: normocephalic, atraumatic; sclera clear, conjunctiva pink; hearing intact; oropharynx clear; neck supple, no JVP Lymph- no cervical lymphadenopathy Lungs- Clear to ausculation bilaterally, normal work of breathing.  No wheezes, rales, rhonchi Heart- Regular rate and rhythm, no murmurs, rubs or gallops, PMI not laterally displaced GI- soft, non-tender, non-distended, bowel sounds present, no hepatosplenomegaly Extremities- no clubbing, cyanosis, or edema; DP/PT/radial pulses 2+ bilaterally MS- no significant deformity or atrophy Skin- warm and dry, no rash or lesion; ICD pocket well healed Psych- euthymic mood, full affect Neuro- strength and sensation are intact  ICD interrogation- reviewed in detail today,  See PACEART report  EKG:  EKG is ordered today. The ekg ordered today shows NSR at 72 bpm, QRS 106 ms  Recent Labs: 04/27/2019: TSH 0.889 04/28/2019: Magnesium 1.9 06/10/2019: ALT 22 08/09/2019: Hemoglobin 15.7; Platelets 199 10/13/2019: BUN 13; Creatinine, Ser 0.85; NT-Pro BNP WILL FOLLOW; Potassium 4.3; Sodium 137   Wt Readings from Last 3 Encounters:  10/13/19 191 lb (86.6 kg)  09/30/19 193 lb 9.6 oz (87.8 kg)  09/28/19 191 lb 2.2 oz (86.7 kg)     Other  studies Reviewed: Additional studies/ records that were reviewed today include: Echo 07/2019 showed LVEF 25-30%   Assessment and Plan:  1.  Chronic systolic dysfunction s/p St. Jude single chamber ICD  euvolemic today NYHA II-III symptoms.  Stable on an appropriate medical regimen Normal ICD function See Pace Art report No changes today He is a reasonable candidate for barostim, but he is not sure at this time. He would like to continue to think about it, but is also open to [place] consideration (as it would involve further work up and give him "longer to consider")  2. Ischemic CM Denies ischemic symptoms at this time  Shaquan Missey Mczeal's heart failure has persisted despite  titration of guideline directed medication such that he qualifies for the [facility] NEO device. The following information from the patient's medical record supports the medical necessity of this procedure for my patient, consistent with the FDA on-label indication for BAROSTIM NEO:  ? LVEF of 25-30%  confirmed by Echo on 07/2019   ? NT-proBNP of <1600 pg/ml  = Labs to be drawn today, 10/13/19  ?Symptomatic despite medication management of: diuretic, beta blocker, ACEi/ARB/ARNi and Aldosterone inhibitor as evidenced by symptoms below. Further titration is also limited by hypotension  ? This patients signs and symptoms of heart failure include "dyspnea with mild to moderate exertion, fatigue and weakness   ? NYHA Congestive Heart Failure Classification: II-III  ? Recent hospitalization for Heart Failure on (not applicable for this patient)   This patient is NOT indicated for cardiac resynchronization therapy because QRS = 106 by EKG on 10/13/19.     Current medicines are reviewed at length with the patient today.   The patient does not have concerns regarding his medicines.  The following changes were made today:  none  Labs/ tests ordered today include:  Orders Placed This Encounter  Procedures  . Basic  Metabolic Panel (BMET)  . Pro b natriuretic peptide (BNP)  . EKG 12-Lead    Disposition:   Follow up with Dr. Rayann Heman as scheduled for 91 day follow up. Further  pending labs for barostim consideration.   Jacalyn Lefevre, PA-C  10/13/2019 6:18 PM  [address] [place] [address] [phone] (office) [phone] (fax)

## 2019-10-14 LAB — BASIC METABOLIC PANEL
BUN/Creatinine Ratio: 15 (ref 10–24)
BUN: 13 mg/dL (ref 8–27)
CO2: 22 mmol/L (ref 20–29)
Calcium: 9.7 mg/dL (ref 8.6–10.2)
Chloride: 101 mmol/L (ref 96–106)
Creatinine, Ser: 0.85 mg/dL (ref 0.76–1.27)
GFR calc Af Amer: 105 mL/min/{1.73_m2} (ref >59)
GFR calc non Af Amer: 91 mL/min/{1.73_m2} (ref >59)
Glucose: 86 mg/dL (ref 65–99)
Potassium: 4.3 mmol/L (ref 3.5–5.2)
Sodium: 137 mmol/L (ref 134–144)

## 2019-10-14 LAB — PRO B NATRIURETIC PEPTIDE: NT-Pro BNP: 483 pg/mL — ABNORMAL HIGH (ref 0–376)

## 2019-10-15 NOTE — Telephone Encounter (Signed)
Obtained [employer] Heart Failure grant for copay assistance with Lisabeth Register:   ID:  [id number] PCN: [id number] Group ID: [id number] RxBin: [id number] Start Date: 07/08/19 End Date: 10/04/20  Audry Riles, PharmD, BCPS, BCCP, CPP Heart Failure Clinic Pharmacist [phone]

## 2019-10-22 ENCOUNTER — Telehealth (HOSPITAL_COMMUNITY): Payer: Self-pay | Admitting: Pharmacy Technician

## 2019-10-22 NOTE — Telephone Encounter (Signed)
Spoke with patient regarding PAN grant approval and provided billing information to patient's pharmacy.  Charlann Boxer, CPhT

## 2019-10-26 ENCOUNTER — Encounter (INDEPENDENT_AMBULATORY_CARE_PROVIDER_SITE_OTHER): Payer: Medicare Other | Admitting: Ophthalmology

## 2019-10-26 ENCOUNTER — Encounter: Payer: Self-pay | Admitting: Nurse Practitioner

## 2019-10-26 ENCOUNTER — Ambulatory Visit: Payer: Medicare Other | Admitting: Nurse Practitioner

## 2019-10-26 ENCOUNTER — Ambulatory Visit (INDEPENDENT_AMBULATORY_CARE_PROVIDER_SITE_OTHER): Payer: Medicare Other | Admitting: Nurse Practitioner

## 2019-10-26 ENCOUNTER — Telehealth: Payer: Self-pay | Admitting: *Deleted

## 2019-10-26 ENCOUNTER — Other Ambulatory Visit: Payer: Self-pay

## 2019-10-26 VITALS — BP 88/62 | HR 100 | Ht 72.0 in | Wt 188.0 lb

## 2019-10-26 DIAGNOSIS — I10 Essential (primary) hypertension: Secondary | ICD-10-CM | POA: Diagnosis not present

## 2019-10-26 DIAGNOSIS — Z006 Encounter for examination for normal comparison and control in clinical research program: Secondary | ICD-10-CM

## 2019-10-26 DIAGNOSIS — H35712 Central serous chorioretinopathy, left eye: Secondary | ICD-10-CM

## 2019-10-26 DIAGNOSIS — H353132 Nonexudative age-related macular degeneration, bilateral, intermediate dry stage: Secondary | ICD-10-CM

## 2019-10-26 DIAGNOSIS — H35033 Hypertensive retinopathy, bilateral: Secondary | ICD-10-CM

## 2019-10-26 DIAGNOSIS — Z86010 Personal history of colonic polyps: Secondary | ICD-10-CM

## 2019-10-26 DIAGNOSIS — H43813 Vitreous degeneration, bilateral: Secondary | ICD-10-CM

## 2019-10-26 NOTE — Progress Notes (Signed)
Agree with your assessment. Await cardiology follow up and echo and will determine if / when he is cleared to have a colonoscopy

## 2019-10-26 NOTE — Progress Notes (Signed)
Patient is a 64 year old male with CAD/MI / aortic stenosis, hypertension / hyperlipidemia / heart failure . He is due for polyp surveillance colonoscopy.  He was scheduled to have this in December 2016 with Dr. Havery Moros but it had to be canceled due to cardiac reasons.  Patient was admitted late December 2020 with chest pain.  He was taken to the Cath Lab and underwent PCI of LAD. He is on Plavix.  His last echocardiogram 08/09/2019 showed an EF of 25 to 30%.  He has since undergone ICD placement  Today's visit was voided.  Patient has no GI complaints.  He has no bowel changes nor blood in stool.  He is scheduled to have a repeat echocardiogram tomorrow and see Cardiology in a couple of weeks.  After talking with the patient I think it makes sense to bring him back to see me following his cardiology visit.  At that time we will have the updated echocardiogram because at this point the colonoscopy would need to be scheduled at the hospital based on EF% which I'm sure has improved since the last one.

## 2019-10-26 NOTE — Patient Instructions (Addendum)
If you are age 66 or older, your body mass index should be between 23-30. Your Body mass index is 25.5 kg/m. If this is out of the aforementioned range listed, please consider follow up with your Primary Care Provider.  If you are age 18 or younger, your body mass index should be between 19-25. Your Body mass index is 25.5 kg/m. If this is out of the aformentioned range listed, please consider follow up with your Primary Care Provider.   Call the office to schedule an appointment with Tye Savoy PA-C once you have received the results of your Echo.

## 2019-10-26 NOTE — Telephone Encounter (Signed)
Spoke with patienton phone about [employer] study.  Patient asked questions and I answered them. Will e-mail patient a copy of consent to review.  Patient coming in on Friday June 18th at 130 for screening.

## 2019-10-28 ENCOUNTER — Other Ambulatory Visit (HOSPITAL_COMMUNITY): Payer: Self-pay | Admitting: Adult Health

## 2019-10-29 ENCOUNTER — Ambulatory Visit (HOSPITAL_COMMUNITY)
Admission: RE | Admit: 2019-10-29 | Discharge: 2019-10-29 | Disposition: A | Discharge status: Home / Self Care | Payer: Medicare Other | Source: Ambulatory Visit | Attending: Internal Medicine | Admitting: Internal Medicine

## 2019-10-29 ENCOUNTER — Other Ambulatory Visit: Payer: Self-pay

## 2019-10-29 ENCOUNTER — Ambulatory Visit (HOSPITAL_BASED_OUTPATIENT_CLINIC_OR_DEPARTMENT_OTHER)
Admission: RE | Admit: 2019-10-29 | Discharge: 2019-10-29 | Disposition: A | Discharge status: Home / Self Care | Payer: Medicare Other | Source: Ambulatory Visit | Attending: Internal Medicine | Admitting: Internal Medicine

## 2019-10-29 ENCOUNTER — Encounter: Payer: Medicare Other | Admitting: *Deleted

## 2019-10-29 VITALS — BP 92/76 | HR 96 | Wt 189.2 lb

## 2019-10-29 DIAGNOSIS — Z006 Encounter for examination for normal comparison and control in clinical research program: Secondary | ICD-10-CM

## 2019-10-29 LAB — ECHOCARDIOGRAM COMPLETE

## 2019-10-29 NOTE — Research (Signed)
Section A:  Administrative Section  Subject ID: [id number] - [id number] Subject Initials: _P_ _D_ _M_  Date subject signed informed consent  _18_/_JUN_/_2021_  (DD / MMM / YYYY)  Section B:  Enrollment Criteria  Does the subject meet the FDA Indication for Use: Patients who remain symptomatic despite treatment with guideline-directed medical therapy, are NYHA Class III or Class II (who had a recent history of Class III), have a left ventricular ejection fraction ? 35%, a NT-proBNP < 1600 pg/ml and excluding patients indicated for Cardiac Resynchronization Therapy (CRT) according to AHA/ACC/ESC guidelines [x]  Yes    []  No (STOP - subject does not qualify for the study)  Has the subject been previously, or are currently, randomized in the CVRx BeAT-HF Trial? []  Yes (STOP - subject does not qualify for the study)   [x]  No  Has the subject received cardiac resynchronization therapy (CRT) within six months of enrollment, or is actively receiving CRT? []  Yes (STOP - subject does not qualify for the study)   [x]  No  Section B:  Screening/Baseline Assessments   Physical Assessment:            Date:    _18_/_Jun_/_2021_ DD / MMM / [place] Breeding)  Weight: _189.2_  []  kg [x]  pounds Height: _182_  []  cm [x]  inches  Blood Pressure: _92_  / _76_mmHg Heart Rate: _96_ bpm  Section C:  Labs   eGFR:  ______ mL/min/1.4m Date:    _18_/_JUN_/_2021_ (DD / MMM / YDallas Breeding  Negative Pregnancy Test? [x]  N/A Date:    _____/_______/_______                  (DD / MMM / YDallas Breeding   []  Yes     []  No   Section D:  Appropriate Surgical/Study Candidate:   Is the subject able to discontinue the use of antiplatelet drugs (e.g. aspirin) in advance of the procedure, if required? [x]  Yes   []  No  Assessed by:   _Annamarie Major MD_______________________ Implanting Physician [x]  Yes Date:    _29_/_JUN_/_2021_  (DD / MMM / YDallas Breeding   []  No   Section E:  Inclusion/Exclusion Criteria  Does the subject meet all Inclusion Criteria? []  Yes   [x]  No  (STOP - subject does not qualify for the study)   Check all that apply   []  1. Age at least 298years and no more than 80 years at the time of enrollment.   [x]  2. Appropriate candidate for the surgery as determined by an evaluation from the implanting physician using a carotid duplex ultrasound (CDU) [or Computed Tomography Angiography (CTA) if CDU inconclusive or incomplete] and a review of medical history (including existence of infections that may increase implant risk).  Evaluation must confirm the following within 30 days of the BAROSTIM NEO implant: . Appropriate medical condition and medical history for implantation of the BAROSTIM NEO System AND . Anatomy that enables this implant procedure, with no vascular structures or orientations or neck anomalies that would be obstructive to the implantation path AND . The artery planned for the BAROSTIM NEO implant must have: o A carotid bifurcation below the level of the mandible AND o No ulcerative carotid arterial plaques AND o No carotid atherosclerosis producing a 30% or greater stenosis in linear diameter in the internal carotid AND o No carotid atherosclerosis producing a 30% or greater stenosis in linear diameter in the distal common carotid AND . Have had no prior surgery, radiation, or endovascular stent placement  in the carotid artery or the carotid sinus region AND . Able to discontinue the use of antiplatelet drugs (e.g. aspirin) in advance of the procedure, if required   []  3. Six-minute hall walk (6MHW) ? 150 m AND ? 400 m within 30 days prior to implant.   []  4. Serum estimated glomerular filtration rate (eGFR) ? 25 mL/min/1.73 m2 using the CKD-EPI method within 30 days prior to the [facility] NEO implant.   []  5. Body mass index ? 40 kg/m2 within 30 days prior to the [facility] NEO implant.   []  6. If male and of childbearing potential, must use a medically accepted method of birth control (e.g., barrier method with spermicide, oral  contraceptive, or abstinence) and agree to continue use of this method for the duration of the study. Women of childbearing potential must have a negative pregnancy test within 14 days prior to the [facility] NEO implant.   []  7. Subjects implanted with a cardiac rhythm management device that does not utilize an intracardiac lead, or implanted with a neurostimulation device, must be approved by the [place].   []  8. Signed a CVRx-approved informed consent form for participation in this study.      Does the subject meet any Exclusion Criteria? []  Yes (STOP - subject does not qualify for the study)   [x]  No     List criteria # met: __________________________   []  1. Previously or currently randomized in the CVRx BeAT-HF Trial.   []  2. Received cardiac resynchronization therapy (CRT) within six months of enrollment or is actively receiving CRT.   []  3. Any of the following contraindications: . Baroreflex failure or autonomic neuropathy  . Uncontrolled, symptomatic cardiac bradyarrhythmias . Known allergy to silicone or titanium   []  4. Unstable ventricular arrhythmias.   []  5. Presence of baseline cranial nerve dysfunction determined by the Ear, Nose and Throat (ENT) examination.   []  6. Subjects with any surgery that has occurred, or is planned to occur, within 45 days of the [facility] NEO implant.   []  7. Recent history (within 6 months of implant) of significant and uncontrolled bleeding.   []  8. Known and untreated hypercoagulability state.   []  9. An inappropriate study candidate as evidenced by: [name] Solid organ or hematologic transplant, or currently being evaluated for an organ transplant. . Has received or is receiving LVAD therapy or chronic dialysis. . Current or planned treatment with intravenous positive inotrope therapy. . Primary pulmonary hypertension. . Severe COPD or severe restrictive lung disease (e.g. requires chronic oral steroid use or home oxygen use). .  Heart  failure secondary to a reversible cause, such as cardiac structural valvular disease, acute myocarditis and pericardial constriction. . Clinically significant cardiac structural valvular disease. .  Unable or unwilling to fulfill the protocol medication compliance and follow-up requirements, for reasons including but not limited to an unresolved history of alcohol or substance abuse or psychiatric disorder. . Active malignancy. .  Any other serious medical condition that may adversely affect the safety of the participant or validity of the study, in the opinion of the investigator. . Life expectancy less than one year.   []  10. Any of the following within 3 months prior to the [facility] NEO implant. . Myocardial infarction . Unstable angina . Coronary intervention (e.g. CABG or PTCA)  . Cerebral vascular accident or transient ischemic attack . Sudden cardiac death  . Surgical cardiac intervention (e.g. cardiac ablation, valve replacement)   []  11. Enrolled and  active in another (e.g. device, pharmaceutical, or biological) clinical study unless approved by the [place].  Section F:  Adverse Events  Were there any Adverse Events that occurred since consent? []  Yes (complete AE form)   [x]  No  Section H:  Medication Changes   Have there been any changes to the subject's home use medications for Arrhythmia, Antiplatelet/Anticoagulation and Heart failure medications during screening and baseline? []  Yes (update Med form)   [x]  No  Section I:  Signature    Person completing form (Print Name): __Kimberly Alba Destine :) ______________________________    Signature: Philemon Kingdom :) _____________________ Date: ___06/29/2021___________

## 2019-10-29 NOTE — Research (Signed)
Patient ID: _[id number] - _[id number]_ - 015 Patient Initials: _P_ _D_ _M_  Visit Interval:    [x]   Screening/Baseline    []  Activation   []  0.5 Month       []  1 Month     []  2 Month     []  3 Month       []  6 Month     []  12 Month         []  Unscheduled, reason for visit: _________________________________________  SECTION B:  NATFT-73 Like Illness Symptoms   Has the subject experienced any cold, flu or COVID-19 symptoms in the past 30 days? [x]   No  (skip to section C)     []  Yes, date of onset:  _____/_____ MMM/YYYY    If yes, check all symptoms that apply:   []  Fevers or chills   []   New or Worsening Cough  []  Productive  []  Dry     If yes to cough, indicate severity  []  Constant  []  Occasional, several per hour   []  New or worsened shortness of breath   []  Diarrhea   []  Altered or reduced sense of smell or taste   []  Muscle aches/Severe Fatigue   []  Chest pain or tightness   []  Sore throat   []  Nausea or vomiting  SECTION C:  COVID-19 Like Illness Testing   Has the patient been tested for COVID-19? []  No     []  Yes, date of test:  _____/_____ MMM/YYYY    If yes, test results:   []  Positive []  Negative []  Unknown  Has the patient been tested for COVID-19 Antibodies? [x]  No     []  Yes, date of test:  _____/_____ MMM/YYYY    If yes, test results:   []  Positive []  Negative []  Unknown  Has the patient been vaccinated for COVID-19? []  No     [x]  Yes, date of test:  13-Jul-2019 MMM/YYYY  Has the patient been tested for Influenza ("flu")? [x]  No     [x]  Yes, date of test:  ____/_____ MMM/YYYY    If yes, test results:   []  Positive []  Negative []  Unknown  Has the patient been vaccinated for Influenza ("flu")? [x]  No     []  Yes, date of test:  _____/_____ MMM/YYYY  SECTION D:  COVID-19 Like Illness Exposure  Has the subject been told that they might have had COVID-19/have symptoms suggestive of COVID-19? [x]    No   []  Yes   []   Unknown  Has the subject been exposed to anyone with known  or suspected COVID-19? [x]    No   []  Yes   []   Unknown  Has the subject been told that they might have had the "flu" or influenza? [x]    No   []  Yes   []   Unknown  SECTION E:  Effects of COVID Pandemic on [place] Interactions  Since the last study visit, did the subject feel the need to go to an emergency department or hospital for their heart failure but decided not to because of concerns about COVID-19? [x]   No   []  Yes   []  Unknown    If yes, how did they seek care (check all that apply):   []  Telemedicine visit []  In-person []  Clinic []  Urgent Care   []  Subject did not change hospital/ER use due to COVID-19 []  Other, specify: ________________________  Since the last study visit, did the subject have a cardiology/HF related appointment cancelled/rescheduled due to  COVID-19 pandemic? [x]   No   []  Yes, how many? ___________   []  Unknown  Since the last study visit, did the subject have any cardiology/HF related telemedicine visit due to COVID-19 pandemic? [x]   No   []  Yes, how many? ___________   []  Unknown  Since the last study visit, did the subject have a cardiology/HF procedure cancelled/rescheduled due to COVID-19 pandemic? [x]   No   []  Yes, how many? ___________   []  Unknown  SECTION F:  Effects of COVID Pandemic on Subject's Medications  Since the last visit, did any of their heart failure medications change or stop, even for a short time?   If yes, enter change into medication eCRF [x]   No   []  Yes, how many? ___________   []  Unknown    If yes, why:   []  Instructed by Doctor []  Self-discontinued []  Unknown []  Other: ________________  Since the last visit, was the subject prescribed any medications for COVID-19? [x]   No   []  Yes   []  Unknown    If yes, what medications (generic name):  SECTION G:  Effects of COVID Pandemic on Subject's Lifestyle  How has the subject's activity/exercise level changed due to COVID-19 pandemic? []   No change   []  More  activity/exercise   [x]  Less activity/exercise  How has the subject's smoking habits changed due to COVID-19? [x]   Does not smoke   []  No change   []  Smoke more   []  Smoke less  How has the subject's alcohol drinking habits changed due to COVID-19? []   Does not drink   [x]  No change   []  Drink more   []  Drink less  Section H:  Signature    Person completing form (Print Name): _____Kimberly Alba Destine :) __________________    Signature: ___Kimberly Alba Destine :) _______ Date: ____06/18/2021________________

## 2019-10-29 NOTE — Research (Signed)
Six Minute Hall Walk Test Date:    _18/_JUN_/_2021_  (DD / MMM / [place])  Distance Walked __289______ meters  Were there any devices used to assist the subject in walking (e.g. cane, walker, etc.)? [x]  No   []  Yes specify:_______________________  Was the walk terminated before 6 minutes? [x]  No   []  Yes specify reason: (select all that apply)    []  Angina    []   Dyspnea    []   Fatigue    []   Dizziness    []   Syncope    []   Other, specify:  Name of person conducting 6-Minute Hall Walk: __Kimberly Alba Destine :) _______   Page 1 of 1     Confidential   Form Protocol [phone] Rev. B dated 17-Feb-2019   Effective Date: 01-Apr-2019

## 2019-10-29 NOTE — Research (Signed)
Section A:  Demographic Section  Subject ID: _[id number]_ - _[id number]_ - 015 Subject Initials: _P _D_ _M_  Date of Birth: [birth year]/_MAY_/1957__      (DD / MMM / [place] [facility]) Gender: [x]  Male    []  Male  Ethnicity  []  Asian    [] Black/African Descent [] Middle Eastern        [x]  White/Caucasian     [] Other/Refused   Section B:  Medical History  1. BAROSTIM NEO FDA Indication for Use  NYHA Classification: Class: __3__________ Date:       _05__ / _APR_ / _2021__                  DD        MMM     YYYY  LVEF: _____30___ % Date:       _29_/ _MAR_ / _2021_                  DD     MMM      YYYY  NT-proBNP: ___483__ pg/ml Date:       __02_ / _JUN_ / _2021_                    DD     MMM      YYYY  CRT Indication: []     Yes          [x]   No Assessment Date:  _05_ / _APR_ / _2021_                                  DD      MMM      YYYY  2. Type of Cardiomyopathy [x]  Ischemic []  Non-ischemic []  Other, specify: ___________    Yes No Unknown  3. Cardiovascular   (a) Resistant Hypertension (SBP?140 mmHg) []  [x]  []    (b) Heart Failure (HF) HF Diagnosis Date: __Dec_/__2020______ (MMM / YYYY)  HF Hospitalizations in last 12 Months _____ [x]  []    (c) Left Ventricular Assist Device []  [x]  []    (d) CRT Specify: []  CRT-D   []  CRT-P Implant Date: ________/________ (MMM / [place] [facility]) [x]  []    (e) PA pressure device (e.g. CardioMEMS) Specify Device: ____________ Implant Date: ________/________ (MMM / [place] [facility]) [x]  []    (f) Myocardial Infarction [x]  []  []    (g) CABG []  [x]  []    (h) Coronary Intervention [x]  []  []    (i) Aortic Valve Disease []  [x]  []    (j) Mitral Valve Disease []  [x]  []    (k) Left Ventricular Hypertrophy []  [x]  []   4. Cardiac Arrhythmia (a) Atrial fibrillation []  [x]  []    (b) Ventricular Tachycardia/Fibrillation []  [x]  []    (c) PVC []  [x]  []    (d) Pacemaker []  [x]  []    (e) ICD [x]  []  []    (f) Ablation Procedure Specify:[]  atrial []  ventricular Last procedure date:         ________/________ (MMM / [place]) [x]  []     (g) Cardioversion Procedure Specify: [] Shock [] Meds Last procedure date:         ________/________ (MMM / [place]) [x]  []    (h) Sudden Cardiac Death []  [x]  []     Yes No Unknown  4. Peripheral Vascular (a) Peripheral Vascular Disease []  [x]  []    (b) PVD Intervention (e.g. stent, angioplasty, etc.). []  [x]  []   5. Cerebrovascular / Neurological (a) Stroke []  [x]  []    (b) TIA []  [x]  []   6. Renal / Hepatic (a)  Prior Renal Denervation Date of Denervation:        ________/________ (MMM / [place] [facility]) [x]  []    (b) Chronic Kidney Disease Stage (1-5): ________   [x]  []    (c) Chronic Dialysis []  [x]  []    (d) Kidney Transplant []  [x]  []   7. Metabolic / Nutritional (a) Diabetes Mellitus  []   Type 1  []   Type 2 [x]  []    (b) Hypokalemia []  [x]  []   Section C:  Comments  Ankylosing spondylitis        Section D:  Signature     Person completing form (Print Name): ___Kimberly Alba Destine :) _____________________    Signature: Philemon Kingdom :) ______ Date: __06/18/2021________________________

## 2019-10-29 NOTE — Progress Notes (Signed)
  Echocardiogram 2D Echocardiogram has been performed.  Brandon [name] 10/29/2019, 4:00 PM

## 2019-10-29 NOTE — Research (Signed)
BatWire Informed Consent   Subject Name: Brandon [name]  Subject met inclusion and exclusion criteria.  The informed consent form, study requirements and expectations were reviewed with the subject and questions and concerns were addressed prior to the signing of the consent form.  The subject verbalized understanding of the trial requirements.  The subject agreed to participate in the BatWire trial and signed the informed consent at 1355 on 29-Oct-2019.  The informed consent was obtained prior to performance of any protocol-specific procedures for the subject.  A copy of the signed informed consent was given to the subject and a copy was placed in the subject's medical record.   Philemon Kingdom D

## 2019-10-29 NOTE — Research (Signed)
Subject ID: _[id number]_ - _[id number]_ - 015 Subject Initials: _P_ _D_ _M_ Visit Interval:  [x]   Baseline     []  6 Month  Section B:  NYHA   NYHA Classification Date:    _18_/_JUN_/_2021_  (DD / MMM / YYYY)  Select One Class Subject Symptoms  []  I No limitations of physical activity, No undue fatigue, palpitation or dyspnea  []  II Slight limitation of physical activity, Comfortable at rest, Less than ordinary activity results in fatigue, Palpitation, or dyspnea  [x]  III Marked limitation of physical activity, Comfortable at rest, Less than ordinary activity results in fatigue, palpitation, or dyspnea  []  IV Unable to carry out any physical activity without discomfort, Symptoms of cardiac insufficiency at rest, Physical activity causes increased discomfort  Name of person conducting NYHA: _____Kimberly Alba Destine :) _____________

## 2019-10-29 NOTE — Research (Signed)
Section A:  Administrative Section  Subject ID: __[id number]_ - _[id number]_ - 015 Subject Initials: _P_ _D_ _M_  Visit Interval:  (* = as required)   [x]  Screening/Baseline    []  1 Month   []  3 Month*       []  6 Month*     []  12 Month*         []  Unscheduled*, reason for visit: _________________________________________  Section D:  Voice Handicap Index - 10   Voice Handicap Index Date:    _18_/_JUN_/_2021_  (DD / MMM / [place])  Instructions: These are statements that many people have used to describe their voices and the effects of their voices on their lives.  Circle the response that indicates how frequently you have the same experience:   Never Almost Never Some-times Almost Always Always  1. My voice makes it difficult for people to hear me. 0 [x]  1 []  2 []  3 []  4 []   2. People have difficulty understanding me in a noisy room. 0 [x]  1 []  2 []  3 []  4 []   3. My voice difficulties restrict personal and social life. 0 [x]  1 []  2 []  3 []  4 []   4. I feel left out of conversations because of my voice. 0 [x]  1 []  2 []  3 []  4 []   5. My voice problem causes me to lose income. 0 [x]  1 []  2 []  3 []  4 []   6. I feel as though I have to strain to produce voice. 0 [x]  1 []  2 []  3 []  4 []   7. The clarity of my voice is unpredictable. 0 [x]  1 []  2 []  3 []  4 []   8. My voice problem upsets me. 0 [x]  1 []  2 []  3 []  4 []   9. My voice makes me feel handicapped. 0 [x]  1 []  2 []  3 []  4 []   10. People ask, "What's wrong with your voice?" 0 [x]  1 []  2 []  3 []  4 []     Section D:  Eating Assessment Tool - 10   Eating Assessment Index Date:    _18_/_JUN_/_2021_  (DD / MMM / YYYY)  To what extent are the following scenarios problematic for you: (Circle the appropriate response)   No Problem    Severe Problem  1. My swallowing problem has caused me to lose weight. 0 [x]  1 []  2 []  3 []  4 []   2. My swallowing problem interferes with my ability to go out for meals. 0 [x]  1 []  2 []  3 []  4 []   3. Swallowing liquids takes extra  effort. 0 [x]  1 []  2 []  3 []  4 []   4. Swallowing solids takes extra effort. 0 [x]  1 []  2 []  3 []  4 []   5. Swallowing pills takes extra effort. 0 [x]  1 []  2 []  3 []  4 []   6. Swallowing is painful. 0 [x]  1 []  2 []  3 []  4 []   7. The pleasure of eating is affected by my swallowing. 0 [x]  1 []  2 []  3 []  4 []   8. When I swallow, food sticks in my throat. 0 [x]  1 []  2 []  3 []  4 []   9. I cough when I eat. 0 [x]  1 []  2 []  3 []  4 []   10. Swallowing is stressful. 0 [x]  1 []  2 []  3 []  4 []   Section E:  Signature Section   Person Administering Questionnaire Name: (person who instructed the subject on how to complete the  form) __Kimberly Alba Destine :) _ (Print)    Brandon Kingdom :) __ (Sign) __18/Jun/2021__________ (Date)  Person Completing Questionnaire Name: (e.g. subject, person reading questionnaire, etc.) Brandon Kingdom :) ___ (Print)    Brandon Kingdom :) __ (Sign) _18/Jun/2021_____________ (Date)

## 2019-10-29 NOTE — Progress Notes (Signed)
BatWire patient 010 screening

## 2019-10-29 NOTE — Research (Signed)
Patient ID: [id number] - _[id number]_ - 015 Patient Initials: _P_ _D_ _M Visit Interval:  [x]   Baseline     []  6 Month  Section D:  MLWHF   [place] Living With Heart Failure Questionnaire Date:    _18_/_Jun_/_2021______  (DD / MMM / YYYY)  These questions concern how your heart failure (heart condition) has prevented you from living as you wanted during the last month.  These items listed below describe different ways some people are affected.  If you are sure an item does not apply to you or is not related to your heart failure then circle 0 (no) and go on to the next item.  If an item does apply to you, then circle the number rating how much it prevented you from living as you wanted.  Did your heart failure prevent you from living as you wanted during the last month by:   No Very little    Very much  1. Causing swelling in your ankles, legs, etc.? 0[]  1[x]  2[]  3[]  4[]  5[]   2. Making you sit or lie down to rest during the day? 0[]  1[]  2[]  3[]  4[x]  5[]   3. Making your walking about or climbing stairs difficult? 0[]  1[]  2[]  3[]  4[x]  5[]   4. Making your working around the house or yard difficult? 0[]  1[]  2[]  3[x]  4[]  5[]   5. Making your going places away from home difficult? 0[]  1[]  2[]  3[x]  4[]  5[]   6. Making your sleeping well at night difficult? 0[]  1[]  2[]  3[]  4[x]  5[]   7. Making your relating to or doing things with your friends or family difficult? 0[]  1[]  2[]   3[x]  4[]  5[]   8. Making your working to earn a living difficult? 0[x]  1[]  2[]  3[]  4[]  5[]   9. Making your recreational pastimes, sports or hobbies difficult? 0[]  1[]  2[]  3[x]  4[]  5[]   10. Making your sexual activities difficult? 0[]  1[]  2[]  3[x]  4[]  5[]   11. Making you eat less of the foods you like? 0[x]  1[]  2[]  3[]  4[]  5[]   12. Making you short of breath? 0[]  1[]  2[]  3[]  4[x]  5[]   13. Making you tired, fatigued, or low on energy? 0[]  1[]  2[]  3[]  4[x]  5[]   14. Making you stay in a hospital? 0[x]  1[]  2[]  3[]  4[]  5[]   15. Costing you money  for medical care? 0[x]  1[]  2[]  3[]  4[]  5[]   16. Giving you side effects from medications? 0[x]  1[]  2[]  3[]  4[]  5[]   17. Making you feel you are a burden to your family or friends? 0[]  1[x]  2[]  3[]  4[]  5[]   18. Making you feel a loss of self-control in your life? 0[]  1[x]  2[]  3[]  4[]  5[]   19. Making you worry? 0[]  1[]  2[x]  3[]  4[]  5[]   20. Making it difficult for you to concentrate or remember things? 0[]  1[]  2[]  3[x]  4[]  5[]   21 Making you feel depressed? 0[]  1[]  2[]  3[x]  4[]  5[]   Section E:  Signature Section  Person Administering Questionnaire Name: (person who read first question and handed questionnaire to subject) __Kimberly Alba Destine :)  (Print) 10/29/2019   __Kimberly Alba Destine :) _______ (Sign) (Date)  Person Completing Questionnaire Name: (e.g. subject, person reading questionnaire, etc.) __Kimberly Alba Destine :)  (Print)  __Kimberly Alba Destine :) _______ (Sign)   10/29/2019    (Date)

## 2019-10-30 LAB — BASIC METABOLIC PANEL
BUN/Creatinine Ratio: 19 (ref 10–24)
BUN: 15 mg/dL (ref 8–27)
CO2: 20 mmol/L (ref 20–29)
Calcium: 9.4 mg/dL (ref 8.6–10.2)
Chloride: 101 mmol/L (ref 96–106)
Creatinine, Ser: 0.81 mg/dL (ref 0.76–1.27)
GFR calc Af Amer: 107 mL/min/{1.73_m2} (ref >59)
GFR calc non Af Amer: 93 mL/min/{1.73_m2} (ref >59)
Glucose: 98 mg/dL (ref 65–99)
Potassium: 4 mmol/L (ref 3.5–5.2)
Sodium: 137 mmol/L (ref 134–144)

## 2019-11-02 ENCOUNTER — Telehealth: Payer: Self-pay | Admitting: *Deleted

## 2019-11-02 DIAGNOSIS — I2510 Atherosclerotic heart disease of native coronary artery without angina pectoris: Secondary | ICD-10-CM

## 2019-11-02 DIAGNOSIS — Z006 Encounter for examination for normal comparison and control in clinical research program: Secondary | ICD-10-CM

## 2019-11-02 NOTE — Progress Notes (Signed)
This is for the batwire study i will inform the patient

## 2019-11-02 NOTE — Telephone Encounter (Addendum)
Spoke to patient regarding setting up CTA [place] as part of his BATWIRE screening.  Scheduled for Thursday, 11-03-2019 with 2:00pm arrival time at [facility].  The patient will only have liquids 4 hours prior to study.   Patient is informed of this CTA and is agreeable, He understands all information regarding only liquids 4 hours prior.   Patient will see Dr. Lucia Gaskins for his ENT screening on Thursday 11-03-2019 at 10:15am.

## 2019-11-04 ENCOUNTER — Ambulatory Visit (HOSPITAL_COMMUNITY)
Admission: RE | Admit: 2019-11-04 | Discharge: 2019-11-04 | Disposition: A | Discharge status: Home / Self Care | Payer: Medicare Other | Source: Ambulatory Visit | Attending: Surgery | Admitting: Surgery

## 2019-11-04 ENCOUNTER — Other Ambulatory Visit: Payer: Self-pay

## 2019-11-04 ENCOUNTER — Encounter (INDEPENDENT_AMBULATORY_CARE_PROVIDER_SITE_OTHER): Payer: Self-pay | Admitting: Otolaryngology

## 2019-11-04 ENCOUNTER — Ambulatory Visit (INDEPENDENT_AMBULATORY_CARE_PROVIDER_SITE_OTHER): Payer: Medicare Other | Admitting: Otolaryngology

## 2019-11-04 VITALS — Temp 95.9°F

## 2019-11-04 DIAGNOSIS — I2510 Atherosclerotic heart disease of native coronary artery without angina pectoris: Secondary | ICD-10-CM

## 2019-11-04 DIAGNOSIS — Z006 Encounter for examination for normal comparison and control in clinical research program: Secondary | ICD-10-CM

## 2019-11-04 MED ORDER — IOHEXOL 350 MG/ML SOLN
75.0000 mL | Freq: Once | INTRAVENOUS | Status: AC | PRN
  Administered 2019-11-04: 75 mL via INTRAVENOUS

## 2019-11-04 NOTE — Progress Notes (Addendum)
Section A:  Administrative Section  Subject ID: _[id number]_ - 010_____ - 015 Subject Initials: __PDM_ ___ ___ Visit Interval:  (* = as required) [x]   Baseline     []  Implant/Pre D/C  Date of Report:   __24___/__JUN____/__2021_____       (DD / MMM / [place])  []   1 Month     []  3 Month*  Name of ENT __Christopher Lucia Gaskins MD_  []   6 Month*     []  12 Month*     []  Unscheduled*  Directions:  Baseline: complete sections B-E only   All other visits, complete all sections below (B-G)   Use comments box to document any abnormalities or changes from Baseline  Was the CVRx device turned off? []  Yes   []  No, specify reason why not: __________________________________  Was a laryngoscope used? []  Yes   []  No  Section B:  Dysphonia   Global Rating of Dysphonia: (Absence of recent upper respiratory infection or recent extensive voice abuse)   [x]   Normal (or clear)   []   Mild   []   Mild to Moderate   []   Moderate   []   Moderate to Severe   []   Severe   []   Aphonic  Section C:  Tongue/Pharynx   Assessment Observation Severity (complete only if abnormal); defined as:    Mild Significant impairment of functioning; patient is unable to carry out usual activities.    Moderate Patient experiences sufficient discomfort to interfere with or reduce their usual level of activity.    Severe Clinically evident impairment of functioning; patient is unable to carry out usual activities.  Pharynx at Rest [x]   Symmetric []   Mild   []  Asymmetric []  Moderate     []  Severe  Pharynx with Phonation ("ah") [x]   Symmetric []   Mild   []  Asymmetric []  Moderate     []  Severe  Tongue Atrophy [x]   Not Present []   Mild   []  Present []  Moderate     []  Severe  Tongue Mobility [x]   Normal []   Mild   []  Reduced []  Moderate     []  Severe  Tongue Protrusion [x]   Midline []   Mild Side: ____________   []  Deviation []  Moderate      []  Severe   Sensation of the Pharynx [x]   Normal []   Mild   []  Decreased []  Moderate     []  Severe   Section D:  House-Brackman Facial Paralysis Scale (Circle One)  Grade Impairment  I  [x]  Normal  II  []  Mild Dysfunction (slight weakness, normal symmetry at rest)  III  []  Moderate Dysfunction (obvious but not disfiguring weakness with synkinesis, normal symmetry at rest); Complete eye closure with maximal effort; Good forehead movement  IV  []  Moderately severe dysfunction (obvious and disfiguring asymmetry, significant synkinesis); Incomplete eye closure; moderate forehead movement  V  []  Severe dysfunction (barely perceptible motion  VI  []  Total paralysis (no movement)  Section E:  Baseline Cranial Nerve Damage  Does the subject have a presence of baseline cranial nerve dysfunction []  Yes, specify what dysfunction: _____________________________________   [x]  No  Section F:  ENT Notes (Pre-D/C and Follow-Up)  Were changes in Dysphonia from baseline or any abnormalities related to the [facility] Implant? []   Yes   []  No   []  Unknown   []  Not Applicable  Were changes in Tongue/Pharynx from baseline or any abnormalities related to the [facility] Implant? []   Yes   []   No   []  Unknown   []  Not Applicable  Were changes in House-Brackman Facial Paralysis Scale from baseline or any abnormalities related to the [facility] Implant? []   Yes   []  No   []  Unknown   []  Not Applicable  Were any of the above changes from baseline potentially related to the intubation for the procedure? []   Yes   []  No   []  Unknown   []  Not Applicable  Section G:  Cranial Nerves  Has there been new cranial nerve dysfunction since Baseline?   Marginal mandibular branch of cranial nerve VII (Facial Nerve) []  Yes, specify what changed: ___________________________________   []  No  Cranial nerve IX (Glossopharyngeal Nerve) []  Yes, specify what changed: ___________________________________   []  No  Cranial nerve X  (Vagus Nerve) []  Yes, specify what changed: ___________________________________   []  No  Cranial nerve XI   (Accessory Nerve) []  Yes, specify what changed: ___________________________________   []  No  Cranial nerve XII  (Hypoglossal Nerve) []  Yes, specify what changed: ___________________________________   []  No  Section H:  Comments/Notes/Describe any abnormalities/deficiencies (as well if post-operative change)

## 2019-11-09 ENCOUNTER — Encounter (HOSPITAL_COMMUNITY): Payer: Medicare Other | Admitting: Internal Medicine

## 2019-11-09 ENCOUNTER — Telehealth: Payer: Self-pay | Admitting: Surgery

## 2019-11-09 ENCOUNTER — Encounter: Payer: Medicare Other | Admitting: *Deleted

## 2019-11-09 DIAGNOSIS — Z006 Encounter for examination for normal comparison and control in clinical research program: Secondary | ICD-10-CM

## 2019-11-09 NOTE — Telephone Encounter (Signed)
[facility] [place]  Patient name: Brandon [name] MRN: [id number] DOB: [birth year] Sex: male   REQUESTING PROVIDER:    Dr. Joylene Grapes   REASON FOR CONSULT:    Barostim implant  HISTORY OF PRESENT ILLNESS:   Brandon [name] is a 64 y.o. male, who I saw today for evaluation of implantation of the Batwire device.  He has class III heart failure.  He is on Entresto.  His last echo in March 2021 showed an ejection fraction of 25 to 30%.  He recently had a left-sided ICD placed.  He has shortness of breath with moderate exertion.[name]  His blood pressure is controlled with medication.  He takes a statin for hypercholesterolemia.  He is a former smoker.  He has ankylosing spondylitis and difficulty with full neck extension.  PAST MEDICAL HISTORY    Past Medical History:  Diagnosis Date  . AICD (automatic cardioverter/defibrillator) present 08/31/2019  . Ankylosing spondylitis ([place])   . Arthritis   . BENIGN PROSTATIC HYPERTROPHY 06/07/2008  . COLONIC POLYPS, HX OF 06/07/2008  . H/O hiatal hernia   . Heart failure ([place])   . HYPERLIPIDEMIA 06/07/2008  . HYPERTENSION 06/07/2008  . Myocardial infarction [facility]) 2005   NSTEMI, s/p LAD stent  . NEPHROLITHIASIS, HX OF 06/07/2008  . STEMI (ST elevation myocardial infarction) ([place]) 04/27/2019     FAMILY HISTORY   Family History  Problem Relation Age of Onset  . Depression Father   . Arthritis Sister        RA  . Heart failure Mother   . Other Neg Hx   . Colon cancer Neg Hx   . Esophageal cancer Neg Hx   . Rectal cancer Neg Hx   . Stomach cancer Neg Hx     SOCIAL HISTORY:   Social History   Socioeconomic History  . Marital status: Single    Spouse name: Not on file  . Number of children: Not on file  . Years of education: Not on file  . Highest education level: Not on file  Occupational History  . 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 Smoking status:  Former Smoker    Packs/day: 0.50    Years: 40.00    Pack years: 20.00    Types: Cigarettes    Quit date: 07/26/2019    Years since quitting: 0.2  . Smokeless tobacco: Never Used  Vaping Use  . Vaping Use: Never used  Substance and Sexual Activity  . Alcohol use: Yes    Comment: daily  . Drug use: No  . Sexual activity: Not on file  Other Topics Concern  . Not on file  Social History Narrative   Lives in [place] alone   Works part time for a [employer] in [place] Strain:   . Difficulty of Paying Living Expenses:   Food Insecurity: No Landscape architect  . Worried About Charity fundraiser in the Last Year: Never true  . Ran Out of Food in the Last Year: Never true  Transportation Needs: No Transportation Needs  . Lack of Transportation (Medical): No  . Lack of Transportation (Non-Medical): No  Physical Activity: Sufficiently Active  . Days of Exercise per Week: 5 days  . Minutes of Exercise per Session: 40 min  Stress:   . Feeling of Stress :   Social Connections:   . Frequency of Communication with Friends  and Family:   . Frequency of Social Gatherings with Friends and Family:   . Attends Religious Services:   . Active Member of Clubs or Organizations:   . Attends Archivist Meetings:   [name] Marital Status:   Intimate Partner Violence:   . Fear of Current or Ex-Partner:   . Emotionally Abused:   [name] Physically Abused:   . Sexually Abused:     ALLERGIES:    No Known Allergies  CURRENT MEDICATIONS:    Current Outpatient Medications  Medication Sig Dispense Refill  . acetaminophen (TYLENOL) 325 MG tablet Take 1-2 tablets (325-650 mg total) by mouth every 4 (four) hours as needed for mild pain.    [name] aspirin 81 MG chewable tablet Chew 1 tablet (81 mg total) by mouth daily. 30 tablet 3  . atorvastatin (LIPITOR) 80 MG tablet TAKE 1 TABLET BY MOUTH ONCE DAILY AT 6PM. 90 tablet 3  .  Carboxymethylcellulose Sodium (ARTIFICIAL TEARS OP) Place 1 drop into both eyes daily as needed (dry eyes).    . carvedilol (COREG) 3.125 MG tablet Take 1 tablet (3.125 mg total) by mouth 2 (two) times daily. 180 tablet 3  . clopidogrel (PLAVIX) 75 MG tablet take 75 mg by mouth daily. 90 tablet 3  . dapagliflozin propanediol (FARXIGA) 10 MG TABS tablet Take 10 mg by mouth daily. 30 tablet 11  . digoxin (LANOXIN) 0.125 MG tablet TAKE 1 TABLET BY MOUTH DAILY 90 tablet 3  . diphenhydrAMINE (BENADRYL) 25 MG tablet Take 50 mg by mouth at bedtime as needed for sleep.    [name] ENTRESTO 24-26 MG TAKE 1 TABLET BY MOUTH 2 (TWO) TIMES DAILY. 180 tablet 3  . eplerenone (INSPRA) 50 MG tablet Take 1 tablet (50 mg total) by mouth daily. 90 tablet 3  . ezetimibe (ZETIA) 10 MG tablet Take 1 tablet (10 mg total) by mouth daily. 90 tablet 3  . furosemide (LASIX) 20 MG tablet Take 2 tablets (40 mg total) by mouth as needed. 180 tablet 0  . inFLIXimab in sodium chloride 0.9 % Inject into the vein every 6 (six) weeks.    [name] loratadine (CLARITIN) 10 MG tablet Take 10 mg by mouth daily as needed for allergies.    . Melatonin 10 MG CAPS Take 10 mg by mouth at bedtime as needed (sleep).    . tamsulosin (FLOMAX) 0.4 MG CAPS capsule TAKE 1 CAPSULE(0.4 MG) BY MOUTH DAILY 90 capsule 1   No current facility-administered medications for this visit.    REVIEW OF SYSTEMS:   [X]  denotes positive finding, [ ]  denotes negative finding Cardiac  Comments:  Chest pain or chest pressure:    Shortness of breath upon exertion: x   Short of breath when lying flat:    Irregular heart rhythm:        Vascular    Pain in calf, thigh, or hip brought on by ambulation:    Pain in feet at night that wakes you up from your sleep:     Blood clot in your veins:    Leg swelling:         Pulmonary    Oxygen at home:    Productive cough:     Wheezing:         Neurologic    Sudden weakness in arms or legs:     Sudden numbness in arms or  legs:     Sudden onset of difficulty speaking or slurred speech:    Temporary loss of vision  in one eye:     Problems with dizziness:         Gastrointestinal    Blood in stool:      Vomited blood:         Genitourinary    Burning when urinating:     Blood in urine:        Psychiatric    Major depression:         Hematologic    Bleeding problems:    Problems with blood clotting too easily:        Skin    Rashes or ulcers:        Constitutional    Fever or chills:     PHYSICAL EXAM:   @VITALSMULTIPLE @  GENERAL: The patient is a well-nourished male, in no acute distress. The vital signs are documented above. CARDIAC: There is a regular rate and rhythm.  VASCULAR: I evaluated the carotid artery with ultrasound.  On the right, his bifurcation is above the hyoid and he cannot fully extend his neck.  Minimal leg edema PULMONARY: Nonlabored respirations  MUSCULOSKELETAL: There are no major deformities or cyanosis. NEUROLOGIC: No focal weakness or paresthesias are detected. SKIN: There are no ulcers or rashes noted. PSYCHIATRIC: The patient has a normal affect.  STUDIES:   I have evaluated the following studies CT angiogram, neck: 1. Mild cervical carotid artery atherosclerosis without significant stenosis. Both carotid bifurcations are located below the level of the mandible. 2. Wide patency of the dominant right vertebral artery. 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 3. Two adjacent right parotid masses measuring 12 and 7 mm, possibly small primary parotid neoplasms (such as Warthin's tumors) or lymph nodes. 4. Aortic Atherosclerosis (ICD10-I70.0) and Emphysema   Carotid duplex: Right Carotid: Velocities in the right ICA are consistent with a 1-39%  stenosis.   Left Carotid: Velocities in the left ICA are consistent with a 1-39%  stenosis.  ASSESSMENT and PLAN   Based on my evaluation today, the patient does not meet criteria  for [facility] implant because of his bifurcation location as well as his inability to fully extend his neck.  I do think he is a candidate for the commercial Barostim device which I discussed with him and will seek insurance approval.   Annamarie Major, IV, MD, FACS [facility] [place] [facility] [phone] Pager [phone]

## 2019-11-10 ENCOUNTER — Ambulatory Visit (HOSPITAL_COMMUNITY)
Admission: RE | Admit: 2019-11-10 | Discharge: 2019-11-10 | Disposition: A | Discharge status: Home / Self Care | Payer: Medicare Other | Source: Ambulatory Visit | Attending: Internal Medicine | Admitting: Internal Medicine

## 2019-11-10 ENCOUNTER — Encounter: Payer: Medicare Other | Admitting: *Deleted

## 2019-11-10 ENCOUNTER — Encounter (HOSPITAL_COMMUNITY): Payer: Self-pay | Admitting: Internal Medicine

## 2019-11-10 ENCOUNTER — Other Ambulatory Visit: Payer: Self-pay

## 2019-11-10 VITALS — BP 102/68 | HR 96 | Ht 72.0 in | Wt 188.0 lb

## 2019-11-10 VITALS — BP 102/68 | HR 96 | Ht 72.0 in | Wt 188.8 lb

## 2019-11-10 DIAGNOSIS — E785 Hyperlipidemia, unspecified: Secondary | ICD-10-CM | POA: Insufficient documentation

## 2019-11-10 DIAGNOSIS — I110 Hypertensive heart disease with heart failure: Secondary | ICD-10-CM | POA: Insufficient documentation

## 2019-11-10 DIAGNOSIS — Z87891 Personal history of nicotine dependence: Secondary | ICD-10-CM | POA: Diagnosis not present

## 2019-11-10 DIAGNOSIS — Z7984 Long term (current) use of oral hypoglycemic drugs: Secondary | ICD-10-CM | POA: Insufficient documentation

## 2019-11-10 DIAGNOSIS — Z8719 Personal history of other diseases of the digestive system: Secondary | ICD-10-CM | POA: Insufficient documentation

## 2019-11-10 DIAGNOSIS — I252 Old myocardial infarction: Secondary | ICD-10-CM | POA: Diagnosis not present

## 2019-11-10 DIAGNOSIS — Z95810 Presence of automatic (implantable) cardiac defibrillator: Secondary | ICD-10-CM | POA: Diagnosis not present

## 2019-11-10 DIAGNOSIS — Z006 Encounter for examination for normal comparison and control in clinical research program: Secondary | ICD-10-CM

## 2019-11-10 DIAGNOSIS — Z87442 Personal history of urinary calculi: Secondary | ICD-10-CM | POA: Diagnosis not present

## 2019-11-10 DIAGNOSIS — I350 Nonrheumatic aortic (valve) stenosis: Secondary | ICD-10-CM | POA: Insufficient documentation

## 2019-11-10 DIAGNOSIS — Z955 Presence of coronary angioplasty implant and graft: Secondary | ICD-10-CM | POA: Diagnosis not present

## 2019-11-10 DIAGNOSIS — N400 Enlarged prostate without lower urinary tract symptoms: Secondary | ICD-10-CM | POA: Diagnosis not present

## 2019-11-10 DIAGNOSIS — Z79899 Other long term (current) drug therapy: Secondary | ICD-10-CM | POA: Insufficient documentation

## 2019-11-10 DIAGNOSIS — I5022 Chronic systolic (congestive) heart failure: Secondary | ICD-10-CM | POA: Insufficient documentation

## 2019-11-10 DIAGNOSIS — I2109 ST elevation (STEMI) myocardial infarction involving other coronary artery of anterior wall: Secondary | ICD-10-CM

## 2019-11-10 DIAGNOSIS — M1990 Unspecified osteoarthritis, unspecified site: Secondary | ICD-10-CM | POA: Diagnosis not present

## 2019-11-10 DIAGNOSIS — R570 Cardiogenic shock: Secondary | ICD-10-CM

## 2019-11-10 DIAGNOSIS — I255 Ischemic cardiomyopathy: Secondary | ICD-10-CM | POA: Insufficient documentation

## 2019-11-10 DIAGNOSIS — I2510 Atherosclerotic heart disease of native coronary artery without angina pectoris: Secondary | ICD-10-CM | POA: Insufficient documentation

## 2019-11-10 DIAGNOSIS — Z7982 Long term (current) use of aspirin: Secondary | ICD-10-CM | POA: Insufficient documentation

## 2019-11-10 DIAGNOSIS — I10 Essential (primary) hypertension: Secondary | ICD-10-CM

## 2019-11-10 DIAGNOSIS — Z8249 Family history of ischemic heart disease and other diseases of the circulatory system: Secondary | ICD-10-CM | POA: Insufficient documentation

## 2019-11-10 LAB — DIGOXIN LEVEL: Digoxin Level: 0.5 ng/mL — ABNORMAL LOW (ref 0.8–2.0)

## 2019-11-10 LAB — BASIC METABOLIC PANEL
Anion gap: 7 (ref 5–15)
BUN: 13 mg/dL (ref 8–23)
CO2: 25 mmol/L (ref 22–32)
Calcium: 9.3 mg/dL (ref 8.9–10.3)
Chloride: 104 mmol/L (ref 98–111)
Creatinine, Ser: 0.78 mg/dL (ref 0.61–1.24)
GFR calc Af Amer: >60 mL/min (ref >60)
GFR calc non Af Amer: >60 mL/min (ref >60)
Glucose, Bld: 103 mg/dL — ABNORMAL HIGH (ref 70–99)
Potassium: 3.7 mmol/L (ref 3.5–5.1)
Sodium: 136 mmol/L (ref 135–145)

## 2019-11-10 LAB — BRAIN NATRIURETIC PEPTIDE: B Natriuretic Peptide: 186.1 pg/mL — ABNORMAL HIGH (ref 0.0–100.0)

## 2019-11-10 NOTE — Progress Notes (Signed)
Advanced Heart Failure Clinic Note   Referring Physician: PCP: [name], MD PCP-Cardiologist: [name], MD  [facility]: Dr. [name]   HPI: 64 y/o smoker (originally from [place], [place]) with h/o CAD s/p previous MI, HTN, HL.  He was admitted 04/27/19 with several days of stuttering CP. Initial ECG showed anterior ST elevation. HsTrop 476 -> >27k. Taken to cath lab which showed chronically occluded RCA (L>>R collaterals) and thrombotic occlusion of proximal LAD. Underwent PCI of LAD. Echo w/ reduced LVEF 30-35% w/ apical aneurysm. RV ok. Post cath, he required milrinone for cardiogenic shock. Diuresed w/ IV Lasix. Milrinone ultimately weaned off   Returns to clinic today for f/u. Overall doing well. Feels down because he didn't qualify for Batwire due to his arthritis. Walking 20 mins 4days/week at reasonable pace. No CP. Mild SOB. No orthopnea or PND.   ECHO 10/29/19 EF 25-30% RV ok. Personally reviewed   ECHO 04/27/19 EF 30-35% RV norma.  Moderate HK LV. Grade IDD.   Past Medical History:  Diagnosis Date  . AICD (automatic cardioverter/defibrillator) present 08/31/2019  . Ankylosing spondylitis ([place])   . Arthritis   . BENIGN PROSTATIC HYPERTROPHY 06/07/2008  . COLONIC POLYPS, HX OF 06/07/2008  . H/O hiatal hernia   . Heart failure ([place])   . HYPERLIPIDEMIA 06/07/2008  . HYPERTENSION 06/07/2008  . Myocardial infarction [facility]) 2005   NSTEMI, s/p LAD stent  . NEPHROLITHIASIS, HX OF 06/07/2008  . STEMI (ST elevation myocardial infarction) ([place]) 04/27/2019    Current Outpatient Medications  Medication Sig Dispense Refill  . acetaminophen (TYLENOL) 325 MG tablet Take 1-2 tablets (325-650 mg total) by mouth every 4 (four) hours as needed for mild pain.    [name] aspirin 81 MG chewable tablet Chew 1 tablet (81 mg total) by mouth daily. 30 tablet 3  . atorvastatin (LIPITOR) 80 MG tablet TAKE 1 TABLET BY MOUTH ONCE DAILY AT 6PM. 90 tablet 3  . Carboxymethylcellulose Sodium (ARTIFICIAL  TEARS OP) Place 1 drop into both eyes daily as needed (dry eyes).    . carvedilol (COREG) 3.125 MG tablet Take 1 tablet (3.125 mg total) by mouth 2 (two) times daily. 180 tablet 3  . clopidogrel (PLAVIX) 75 MG tablet take 75 mg by mouth daily. 90 tablet 3  . dapagliflozin propanediol (FARXIGA) 10 MG TABS tablet Take 10 mg by mouth daily. 30 tablet 11  . digoxin (LANOXIN) 0.125 MG tablet TAKE 1 TABLET BY MOUTH DAILY 90 tablet 3  . diphenhydrAMINE (BENADRYL) 25 MG tablet Take 50 mg by mouth at bedtime as needed for sleep.    [name] ENTRESTO 24-26 MG TAKE 1 TABLET BY MOUTH 2 (TWO) TIMES DAILY. 180 tablet 3  . eplerenone (INSPRA) 50 MG tablet Take 1 tablet (50 mg total) by mouth daily. 90 tablet 3  . ezetimibe (ZETIA) 10 MG tablet Take 1 tablet (10 mg total) by mouth daily. 90 tablet 3  . furosemide (LASIX) 20 MG tablet Take 2 tablets (40 mg total) by mouth as needed. 180 tablet 0  . inFLIXimab in sodium chloride 0.9 % Inject into the vein every 6 (six) weeks.    [name] loratadine (CLARITIN) 10 MG tablet Take 10 mg by mouth daily as needed for allergies.    . Melatonin 10 MG CAPS Take 10 mg by mouth at bedtime as needed (sleep).    . tamsulosin (FLOMAX) 0.4 MG CAPS capsule TAKE 1 CAPSULE(0.4 MG) BY MOUTH DAILY 90 capsule 1   No current facility-administered medications for this encounter.  No Known Allergies    Social History   Socioeconomic History  . Marital status: Single    Spouse name: Not on file  . Number of children: Not on file  . Years of education: Not on file  . Highest education level: Not on file  Occupational History  . 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 Smoking status: Former Smoker    Packs/day: 0.50    Years: 40.00    Pack years: 20.00    Types: Cigarettes    Quit date: 07/26/2019    Years since quitting: 0.2  . Smokeless tobacco: Never Used  Vaping Use  . Vaping Use: Never used  Substance and Sexual Activity  . Alcohol use: Yes     Comment: daily  . Drug use: No  . Sexual activity: Not on file  Other Topics Concern  . Not on file  Social History Narrative   Lives in [place] alone   Works part time for a [employer] in [place] Strain:   . Difficulty of Paying Living Expenses:   Food Insecurity: No Landscape architect  . Worried About Charity fundraiser in the Last Year: Never true  . Ran Out of Food in the Last Year: Never true  Transportation Needs: No Transportation Needs  . Lack of Transportation (Medical): No  . Lack of Transportation (Non-Medical): No  Physical Activity: Sufficiently Active  . Days of Exercise per Week: 5 days  . Minutes of Exercise per Session: 40 min  Stress:   . Feeling of Stress :   Social Connections:   . Frequency of Communication with Friends and Family:   . Frequency of Social Gatherings with Friends and Family:   . Attends Religious Services:   . Active Member of Clubs or Organizations:   . Attends Archivist Meetings:   [name] Marital Status:   Intimate Partner Violence:   . Fear of Current or Ex-Partner:   . Emotionally Abused:   [name] Physically Abused:   . Sexually Abused:       Family History  Problem Relation Age of Onset  . Depression Father   . Arthritis Sister        RA  . Heart failure Mother   . Other Neg Hx   . Colon cancer Neg Hx   . Esophageal cancer Neg Hx   . Rectal cancer Neg Hx   . Stomach cancer Neg Hx     Vitals:   11/10/19 1103  BP: 102/68  Pulse: 96  SpO2: 98%  Weight: 85.6 kg (188 lb 12.8 oz)  Height: 6' (1.829 m)   Wt Readings from Last 3 Encounters:  11/10/19 85.6 kg (188 lb 12.8 oz)  10/29/19 85.8 kg (189 lb 3.2 oz)  10/26/19 85.3 kg (188 lb)   PHYSICAL EXAM: General:  Well appearing. No resp difficulty HEENT: normal Neck: supple. no JVD. Carotids 2+ bilat; no bruits. No lymphadenopathy or thryomegaly appreciated. Cor: PMI nondisplaced. Regular rate  & rhythm. No rubs, gallops or murmurs. Lungs: clear Abdomen: soft, nontender, nondistended. No hepatosplenomegaly. No bruits or masses. Good bowel sounds. Extremities: no cyanosis, clubbing, rash, edema Neuro: alert & orientedx3, cranial nerves grossly intact. moves all 4 extremities w/o difficulty. Affect pleasant  ASSESSMENT & PLAN:  1.  Chronic systolic heart failure/ischemic cardiomyopathy:  - 04/27/19 ECHO- EF post anterior MI 30 to 35%. - Echo 6/21 EF 25-30%  RV ok Personally reviewed - s/p ICD - Stable NYHA II. Volume status stable. Continue lasix PRN.   -Continue eplerenone 25 mg daily -Continue digoxin 0.125 mg daily.  Check dig level today -Continue coreg 3.125 mg twice a day -Continue Entresto 24-26 mg twice a day. BP too low to titrate - Consider Farxiga as BP improves - Continue exercise - Labs today - Discussed possible need for advanced therapies down the road.  2. CAD: s/p anterior STEMI 12/20. LHC showed chronically occluded RCA (with L>>R collaterals) and thrombotic occlusion of proximal LAD. Underwent PCI of LAD. - No s/s angina  - continue DAPT w/ ASA + Effient for a minimum of 12 months - continue high intensity statin + Zetia. LDL goal < 70 (79 mg/dL 12/20).  - Continue Coreg 3.125 mg bid  3.  Tobacco abuse: Former smoker.  - continues to refrain from tobacco use.  - No change.    4.  Hyperlipidemia: Recent LDL 81 mg/dL.  Goal in the setting of known coronary disease and recent myocardial infarction is less than 70 mg/dL.  Reports full compliance with atorvastatin 80 mg and tolerating well without side effects. -now followed by Dr. Debara Pickett in [facility] and 10 mg Zetia added. Consider PCSK9i  5. ETOH  - reports he has quit drinking  6. Aortic Stenosis -Mild-mod on ECHO    Glori Bickers, MD 11/10/19

## 2019-11-10 NOTE — Patient Instructions (Signed)
Labs done today. We will contact you only if your labs are abnormal.  No medication changes were made. Please continue all current medications as prescribed.  Your physician recommends that you schedule a follow-up appointment in: 6 months. Please contact our office in November for a December appointment.  If you have any questions or concerns before your next appointment please send [place] a message through [place] or call our office at [phone].    TO LEAVE A MESSAGE FOR THE NURSE SELECT OPTION 2, PLEASE LEAVE A MESSAGE INCLUDING: . YOUR NAME . DATE OF BIRTH . CALL BACK NUMBER . REASON FOR CALL**this is important as we prioritize the call backs  [place] AS LONG AS YOU CALL BEFORE 4:00 PM   At the [facility], you and your health needs are our priority. As part of our continuing mission to provide you with exceptional heart care, we have created designated Provider Care Teams. These Care Teams include your primary Cardiologist (physician) and Advanced Practice Providers (APPs- Physician Assistants and Nurse Practitioners) who all work together to provide you with the care you need, when you need it.   You may see any of the following providers on your designated Care Team at your next follow up: [name] Dr Glori Bickers . Dr Loralie Champagne . Darrick Grinder, NP . Lyda Jester, PA . Audry Riles, PharmD   Please be sure to bring in all your medications bottles to every appointment.

## 2019-11-10 NOTE — Research (Signed)
Patient came in to see Dr Trula Slade and do his assessment of his carotids.

## 2019-11-11 NOTE — Research (Signed)
Subject Name: Brandon [name]  Subject met inclusion and exclusion criteria.  The informed consent form, study requirements and expectations were reviewed with the subject and questions and concerns were addressed prior to the signing of the consent form.  The subject verbalized understanding of the trial requirements.  The subject agreed to participate in the ANthem trial and signed the informed consent at 1137 on 11/10/2019  The informed consent was obtained prior to performance of any protocol-specific procedures for the subject.  A copy of the signed informed consent was given to the subject and a copy was placed in the subject's medical record.   Star Age [place]

## 2019-11-15 ENCOUNTER — Other Ambulatory Visit (INDEPENDENT_AMBULATORY_CARE_PROVIDER_SITE_OTHER): Payer: Self-pay

## 2019-11-15 DIAGNOSIS — K118 Other diseases of salivary glands: Secondary | ICD-10-CM

## 2019-11-16 ENCOUNTER — Encounter (HOSPITAL_COMMUNITY): Payer: Self-pay

## 2019-11-16 NOTE — Progress Notes (Signed)
Jennefer Bravo Male, 64 y.o., 1956/04/19 MRN:  [id number] Phone:  [phone] Jerilynn Mages) PCP:  [name], MD Primary Cvg:  Medicare/Medicare Part A And B Next Appt With Radiology ([facility]-US 2) 11/24/2019 at 1:00 PM  RE: Biopsy Received: Today Message Details  Markus Daft, MD  Ernestene Mention for US guided right parotid biopsy/FNA. Need to have cytology available.   [place]   Previous Messages  ----- Message -----  From: Lenore Cordia  Sent: 11/16/2019 11:04 AM EDT  To: Ir Procedure Requests  Subject: Biopsy                      Procedure Requested: [place] FNA    Reason for Procedure: Right parotide mass    Provider Requesting: Rozetta Nunnery  Provider Telephone: [phone]    Other Info: Rad Exam in Epic

## 2019-11-17 ENCOUNTER — Encounter (HOSPITAL_COMMUNITY): Payer: Self-pay

## 2019-11-17 NOTE — Progress Notes (Signed)
Doug Sou E Discussed with Dr. Marlou Porch of interventional cardiology.   With new generation stents OK to hold plavix as he is >6 months out.    OK to hold plavix for 5 days prior, and would resume 2 days after.   Legrand Como [address] [place], PA-C  11/17/2019 3:02 PM     Previous Messages  ----- Message -----  From: Lenore Cordia  Sent: 11/16/2019  2:43 PM EDT  To: Constance Haw, MD, *  Subject: FW: Biopsy                    Hello,   Mr. Trueheart has been scheduled for a Biopsy, November 24, 2019   He is on Plavix.  He needs to hold his Plavix for 5 days, prior to Biopsy.  Your permission is needed.   Sanela Evola  ----- Message -----  From: Markus Daft, MD  Sent: 11/16/2019 12:15 PM EDT  To: Lenore Cordia  Subject: RE: Biopsy                    Ok for US guided right parotid biopsy/FNA. Need to have cytology available.   [place]  ----- Message -----  From: Lenore Cordia  Sent: 11/16/2019 11:04 AM EDT  To: Ir Procedure Requests  Subject: Biopsy                      Procedure Requested: [place] FNA    Reason for Procedure: Right parotide mass    Provider Requesting: Rozetta Nunnery  Provider Telephone: [phone]    Other Info: Rad Exam in Epic

## 2019-11-17 NOTE — Progress Notes (Signed)
PM 2  Unknown Schleyer Male, 64 y.o., [birth year] MRN:  [id number] Phone:  [phone] Jerilynn Mages) PCP:  [name], MD Primary Cvg:  Medicare/Medicare Part A And B Next Appt With Radiology ([facility]-US 2) 11/24/2019 at 1:00 PM  RE: Biopsy Received: Today Message Details  Macy Mis, CMA  Lenore Cordia Dr. Lucia Gaskins stated that is ok. Thank you.   Previous Messages  ----- Message -----  From: Lenore Cordia  Sent: 11/17/2019 10:26 AM EDT  To: Macy Mis, CMA  Subject: FW: Biopsy                     ----- Message -----  From: Lenore Cordia  Sent: 11/16/2019  2:43 PM EDT  To: Constance Haw, MD, *  Subject: FW: Biopsy                    Hello,   Mr. Geisinger has been scheduled for a Biopsy, November 24, 2019   He is on Plavix.  He needs to hold his Plavix for 5 days, prior to Biopsy.  Your permission is needed.   Undrea Shipes  ----- Message -----  From: Markus Daft, MD  Sent: 11/16/2019 12:15 PM EDT  To: Lenore Cordia  Subject: RE: Biopsy                    Ok for US guided right parotid biopsy/FNA. Need to have cytology available.   [place]  ----- Message -----  From: Lenore Cordia  Sent: 11/16/2019 11:04 AM EDT  To: Ir Procedure Requests  Subject: Biopsy                      Procedure Requested: [place] FNA    Reason for Procedure: Right parotide mass    Provider Requesting: Rozetta Nunnery  Provider Telephone: [phone]    Other Info: Rad Exam in Epic

## 2019-11-19 ENCOUNTER — Other Ambulatory Visit: Payer: Self-pay

## 2019-11-19 ENCOUNTER — Encounter: Payer: Medicare Other | Admitting: *Deleted

## 2019-11-19 DIAGNOSIS — Z006 Encounter for examination for normal comparison and control in clinical research program: Secondary | ICD-10-CM

## 2019-11-19 NOTE — Research (Signed)
Patient came into clinic today for a lab re-draw for the [facility]

## 2019-11-23 ENCOUNTER — Other Ambulatory Visit: Payer: Self-pay | Admitting: Student

## 2019-11-23 ENCOUNTER — Other Ambulatory Visit: Payer: Self-pay | Admitting: Radiology

## 2019-11-24 ENCOUNTER — Encounter (HOSPITAL_COMMUNITY): Payer: Self-pay

## 2019-11-24 ENCOUNTER — Ambulatory Visit (HOSPITAL_COMMUNITY)
Admission: RE | Admit: 2019-11-24 | Discharge: 2019-11-24 | Disposition: A | Discharge status: Home / Self Care | Payer: Medicare Other | Source: Ambulatory Visit | Attending: Otolaryngology | Admitting: Otolaryngology

## 2019-11-24 ENCOUNTER — Other Ambulatory Visit: Payer: Self-pay

## 2019-11-24 ENCOUNTER — Other Ambulatory Visit (INDEPENDENT_AMBULATORY_CARE_PROVIDER_SITE_OTHER): Payer: Self-pay | Admitting: Otolaryngology

## 2019-11-24 DIAGNOSIS — Z95810 Presence of automatic (implantable) cardiac defibrillator: Secondary | ICD-10-CM | POA: Insufficient documentation

## 2019-11-24 DIAGNOSIS — R221 Localized swelling, mass and lump, neck: Secondary | ICD-10-CM | POA: Diagnosis present

## 2019-11-24 DIAGNOSIS — I252 Old myocardial infarction: Secondary | ICD-10-CM | POA: Diagnosis not present

## 2019-11-24 DIAGNOSIS — K118 Other diseases of salivary glands: Secondary | ICD-10-CM

## 2019-11-24 DIAGNOSIS — M459 Ankylosing spondylitis of unspecified sites in spine: Secondary | ICD-10-CM | POA: Insufficient documentation

## 2019-11-24 DIAGNOSIS — Z79899 Other long term (current) drug therapy: Secondary | ICD-10-CM | POA: Insufficient documentation

## 2019-11-24 DIAGNOSIS — Z8719 Personal history of other diseases of the digestive system: Secondary | ICD-10-CM | POA: Insufficient documentation

## 2019-11-24 DIAGNOSIS — E785 Hyperlipidemia, unspecified: Secondary | ICD-10-CM | POA: Diagnosis not present

## 2019-11-24 DIAGNOSIS — Z86018 Personal history of other benign neoplasm: Secondary | ICD-10-CM | POA: Insufficient documentation

## 2019-11-24 DIAGNOSIS — M1990 Unspecified osteoarthritis, unspecified site: Secondary | ICD-10-CM | POA: Insufficient documentation

## 2019-11-24 DIAGNOSIS — Z87891 Personal history of nicotine dependence: Secondary | ICD-10-CM | POA: Insufficient documentation

## 2019-11-24 DIAGNOSIS — Z8249 Family history of ischemic heart disease and other diseases of the circulatory system: Secondary | ICD-10-CM | POA: Diagnosis not present

## 2019-11-24 DIAGNOSIS — Z955 Presence of coronary angioplasty implant and graft: Secondary | ICD-10-CM | POA: Diagnosis not present

## 2019-11-24 DIAGNOSIS — N400 Enlarged prostate without lower urinary tract symptoms: Secondary | ICD-10-CM | POA: Insufficient documentation

## 2019-11-24 DIAGNOSIS — I10 Essential (primary) hypertension: Secondary | ICD-10-CM | POA: Insufficient documentation

## 2019-11-24 DIAGNOSIS — Z7982 Long term (current) use of aspirin: Secondary | ICD-10-CM | POA: Insufficient documentation

## 2019-11-24 DIAGNOSIS — Z7902 Long term (current) use of antithrombotics/antiplatelets: Secondary | ICD-10-CM | POA: Diagnosis not present

## 2019-11-24 DIAGNOSIS — Z87442 Personal history of urinary calculi: Secondary | ICD-10-CM | POA: Diagnosis not present

## 2019-11-24 LAB — CBC WITH DIFFERENTIAL/PLATELET
Abs Immature Granulocytes: 0.02 K/uL (ref 0.00–0.07)
Basophils Absolute: 0 K/uL (ref 0.0–0.1)
Basophils Relative: 0 %
Eosinophils Absolute: 0.1 K/uL (ref 0.0–0.5)
Eosinophils Relative: 1 %
HCT: 52.2 % — ABNORMAL HIGH (ref 39.0–52.0)
Hemoglobin: 16.6 g/dL (ref 13.0–17.0)
Immature Granulocytes: 0 %
Lymphocytes Relative: 24 %
Lymphs Abs: 1.9 K/uL (ref 0.7–4.0)
MCH: 29.2 pg (ref 26.0–34.0)
MCHC: 31.8 g/dL (ref 30.0–36.0)
MCV: 91.9 fL (ref 80.0–100.0)
Monocytes Absolute: 0.7 K/uL (ref 0.1–1.0)
Monocytes Relative: 9 %
Neutro Abs: 5.1 K/uL (ref 1.7–7.7)
Neutrophils Relative %: 66 %
Platelets: 212 K/uL (ref 150–400)
RBC: 5.68 MIL/uL (ref 4.22–5.81)
RDW: 14.6 % (ref 11.5–15.5)
WBC: 7.8 K/uL (ref 4.0–10.5)
nRBC: 0 % (ref 0.0–0.2)

## 2019-11-24 LAB — PROTIME-INR
INR: 1 (ref 0.8–1.2)
Prothrombin Time: 12.7 seconds (ref 11.4–15.2)

## 2019-11-24 MED ORDER — SODIUM CHLORIDE 0.9 % IV SOLN: INTRAVENOUS | Status: DC

## 2019-11-24 MED ORDER — SODIUM CHLORIDE 0.9 % IV SOLN
INTRAVENOUS | Status: AC | PRN
  Administered 2019-11-24: 10 mL/h via INTRAVENOUS

## 2019-11-24 MED ORDER — FENTANYL CITRATE (PF) 100 MCG/2ML IJ SOLN
INTRAMUSCULAR | Status: AC
  Filled 2019-11-24: qty 2

## 2019-11-24 MED ORDER — MIDAZOLAM HCL 2 MG/2ML IJ SOLN
INTRAMUSCULAR | Status: AC
  Filled 2019-11-24: qty 2

## 2019-11-24 MED ORDER — MIDAZOLAM HCL 2 MG/2ML IJ SOLN
INTRAMUSCULAR | Status: AC | PRN
  Administered 2019-11-24 (×2): 0.5 mg via INTRAVENOUS

## 2019-11-24 MED ORDER — LIDOCAINE HCL (PF) 1 % IJ SOLN
INTRAMUSCULAR | Status: AC
  Filled 2019-11-24: qty 30

## 2019-11-24 MED ORDER — FENTANYL CITRATE (PF) 100 MCG/2ML IJ SOLN
INTRAMUSCULAR | Status: AC | PRN
  Administered 2019-11-24 (×2): 25 ug via INTRAVENOUS

## 2019-11-24 NOTE — Procedures (Signed)
Interventional Radiology Procedure:   Indications: Right parotid nodules    Procedure: US guided FNA of right parotid nodules   Findings: 2 nodules in right parotid.   4 FNAs of each nodule.  Complications: None     EBL: less than 10 ml  Plan: Discharge to home.    Makinsey Pepitone R. Anselm Pancoast, MD  Pager: [phone]

## 2019-11-24 NOTE — Discharge Instructions (Signed)
Needle Biopsy, Care After This sheet gives you information about how to care for yourself after your procedure. Your health care provider may also give you more specific instructions. If you have problems or questions, contact your health care provider. What can I expect after the procedure? After the procedure, it is common to have soreness, bruising, or mild pain at the puncture site. This should go away in a few days. Follow these instructions at home: Needle insertion site care   Wash your hands with soap and water before you change your bandage (dressing). If you cannot use soap and water, use hand sanitizer.  Follow instructions from your health care provider about how to take care of your puncture site. This includes: ? When and how to change your dressing. ? When to remove your dressing.  Check your puncture site every day for signs of infection. Check for: ? Redness, swelling, or pain. ? Fluid or blood. ? Pus or a bad smell. ? Warmth. General instructions  Return to your normal activities as told by your health care provider. Ask your health care provider what activities are safe for you.  Do not take baths, swim, or use a hot tub until your health care provider approves. Ask your health care provider if you may take showers. You may only be allowed to take sponge baths.  Take over-the-counter and prescription medicines only as told by your health care provider.  Keep all follow-up visits as told by your health care provider. This is important. Contact a health care provider if:  You have a fever.  You have redness, swelling, or pain at the puncture site that lasts longer than a few days.  You have fluid, blood, or pus coming from your puncture site.  Your puncture site feels warm to the touch. Get help right away if:  You have severe bleeding from the puncture site. Summary  After the procedure, it is common to have soreness, bruising, or mild pain at the puncture  site. This should go away in a few days.  Check your puncture site every day for signs of infection, such as redness, swelling, or pain.  Get help right away if you have severe bleeding from your puncture site. This information is not intended to replace advice given to you by your health care provider. Make sure you discuss any questions you have with your health care provider. Document Revised: 07/11/2017 Document Reviewed: 05/12/2017 [employer] Patient Education  2020 [employer]  

## 2019-11-24 NOTE — H&P (Signed)
Chief Complaint: Patient was seen in consultation today for parotid mass.   Referring Physician(s): Newman,Christopher E  Supervising Physician: Markus Daft  Patient Status: [facility] - Out-pt  History of Present Illness: Brandon [name] is a 64 y.o. male with past medical history of arthritis, HLD, HTN, NSTEMI, STEMI s/p AICD placement who was incidentally found to have a right parotid mass on CTA Neck initially completed for research trial inclusion. Patient referred for biopsy at the request of Dr. Chauncy Passy. Case reviewed and approved by Dr. Anselm Pancoast.  Patient presents to [facility] for procedure today. He reports that he did have a Warthin's tumor removed several years ago in a similar location.  He has been NPO.  He does not take blood thinners. He is agreeable to procedure today.   Past Medical History:  Diagnosis Date  . AICD (automatic cardioverter/defibrillator) present 08/31/2019  . Ankylosing spondylitis ([place])   . Arthritis   . BENIGN PROSTATIC HYPERTROPHY 06/07/2008  . COLONIC POLYPS, HX OF 06/07/2008  . H/O hiatal hernia   . Heart failure ([place])   . HYPERLIPIDEMIA 06/07/2008  . HYPERTENSION 06/07/2008  . Myocardial infarction [facility]) 2005   NSTEMI, s/p LAD stent  . NEPHROLITHIASIS, HX OF 06/07/2008  . STEMI (ST elevation myocardial infarction) ([place]) 04/27/2019    Past Surgical History:  Procedure Laterality Date  . CORONARY ANGIOPLASTY WITH STENT PLACEMENT  2005   LAD stent, jailed diagonal  . CORONARY/GRAFT ACUTE MI REVASCULARIZATION N/A 04/27/2019   Procedure: Coronary/Graft Acute MI Revascularization;  Surgeon: [name], MD;  Location: [place] [facility];  Service: Cardiovascular;  Laterality: N/A;  . ICD IMPLANT  08/31/2019  . ICD IMPLANT N/A 08/31/2019   Procedure: ICD IMPLANT;  Surgeon: [name], MD;  Location: [place] [facility];  Service: Cardiovascular;  Laterality: N/A;  . LAMINECTOMY     lumbar  . LEFT HEART CATH AND CORONARY ANGIOGRAPHY N/A  04/27/2019   Procedure: LEFT HEART CATH AND CORONARY ANGIOGRAPHY;  Surgeon: [name], MD;  Location: [place] [facility];  Service: Cardiovascular;  Laterality: N/A;  . LUMBAR FUSION  01/2012  . RIGHT HEART CATH N/A 04/27/2019   Procedure: RIGHT HEART CATH;  Surgeon: [name], Anzel M, MD;  Location: [place] [facility];  Service: Cardiovascular;  Laterality: N/A;  . TONSILLECTOMY    . warthin tumor removal Left 2014    Allergies: Patient has no known allergies.  Medications: Prior to Admission medications   Medication Sig Start Date End Date Taking? Authorizing Provider  acetaminophen (TYLENOL) 325 MG tablet Take 1-2 tablets (325-650 mg total) by mouth every 4 (four) hours as needed for mild pain. 09/01/19  Yes Shirley Friar, PA-C  aspirin 81 MG chewable tablet Chew 1 tablet (81 mg total) by mouth daily. 05/05/19  Yes Bensimhon, Shaune Pascal, MD  atorvastatin (LIPITOR) 80 MG tablet TAKE 1 TABLET BY MOUTH ONCE DAILY AT 6PM. 05/21/19  Yes Rosita Fire, Brittainy M, PA-C  Carboxymethylcellulose Sodium (ARTIFICIAL TEARS OP) Place 1 drop into both eyes daily as needed (dry eyes).   Yes [provider]  carvedilol (COREG) 3.125 MG tablet Take 1 tablet (3.125 mg total) by mouth 2 (two) times daily. 05/21/19  Yes Lyda Jester M, PA-C  clopidogrel (PLAVIX) 75 MG tablet take 75 mg by mouth daily. 09/01/19  Yes Shirley Friar, PA-C  dapagliflozin propanediol (FARXIGA) 10 MG TABS tablet Take 10 mg by mouth daily. 09/21/19  Yes Bensimhon, Shaune Pascal, MD  digoxin (LANOXIN) 0.125 MG  tablet TAKE 1 TABLET BY MOUTH DAILY 08/20/19  Yes Bensimhon, Shaune Pascal, MD  diphenhydrAMINE (BENADRYL) 25 MG tablet Take 50 mg by mouth at bedtime as needed for sleep.   Yes [provider]  ENTRESTO 24-26 MG TAKE 1 TABLET BY MOUTH 2 (TWO) TIMES DAILY. 10/28/19  Yes Bensimhon, Shaune Pascal, MD  eplerenone (INSPRA) 50 MG tablet Take 1 tablet (50 mg total) by mouth daily. 09/06/19  Yes Larey Dresser, MD   ezetimibe (ZETIA) 10 MG tablet Take 1 tablet (10 mg total) by mouth daily. 06/17/19  Yes Hilty, Nadean Corwin, MD  furosemide (LASIX) 20 MG tablet Take 2 tablets (40 mg total) by mouth as needed. 05/21/19  Yes Lyda Jester M, PA-C  inFLIXimab in sodium chloride 0.9 % Inject into the vein every 6 (six) weeks.   Yes [provider]  loratadine (CLARITIN) 10 MG tablet Take 10 mg by mouth daily as needed for allergies.   Yes [provider]  Melatonin 10 MG CAPS Take 10 mg by mouth at bedtime as needed (sleep).   Yes [provider]  tamsulosin (FLOMAX) 0.4 MG CAPS capsule TAKE 1 CAPSULE(0.4 MG) BY MOUTH DAILY 07/19/19  Yes [name], MD     Family History  Problem Relation Age of Onset  . Depression Father   . Arthritis Sister        RA  . Heart failure Mother   . Other Neg Hx   . Colon cancer Neg Hx   . Esophageal cancer Neg Hx   . Rectal cancer Neg Hx   . Stomach cancer Neg Hx     Social History   Socioeconomic History  . Marital status: Single    Spouse name: Not on file  . Number of children: Not on file  . Years of education: Not on file  . Highest education level: Not on file  Occupational History  . 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 Smoking status: Former Smoker    Packs/day: 0.50    Years: 40.00    Pack years: 20.00    Types: Cigarettes    Quit date: 07/26/2019    Years since quitting: 0.3  . Smokeless tobacco: Never Used  Vaping Use  . Vaping Use: Never used  Substance and Sexual Activity  . Alcohol use: Yes    Comment: daily  . Drug use: No  . Sexual activity: Not on file  Other Topics Concern  . Not on file  Social History Narrative   Lives in [place] alone   Works part time for a [employer] in [place] Strain:   . Difficulty of Paying Living Expenses:   Food Insecurity: No Landscape architect  . Worried About Ship broker in the Last Year: Never true  . Ran Out of Food in the Last Year: Never true  Transportation Needs: No Transportation Needs  . Lack of Transportation (Medical): No  . Lack of Transportation (Non-Medical): No  Physical Activity: Sufficiently Active  . Days of Exercise per Week: 5 days  . Minutes of Exercise per Session: 40 min  Stress:   . Feeling of Stress :   Social Connections:   . Frequency of Communication with Friends and Family:   . Frequency of Social Gatherings with Friends and Family:   . Attends Religious Services:   . Active Member of Clubs or Organizations:   .  Attends Archivist Meetings:   [name] Marital Status:      Review of Systems: A 12 point ROS discussed and pertinent positives are indicated in the HPI above.  All other systems are negative.  Review of Systems  Constitutional: Negative for fatigue and fever.  Respiratory: Negative for cough and shortness of breath.   Cardiovascular: Negative for chest pain.  Gastrointestinal: Negative for abdominal pain.  Musculoskeletal: Negative for back pain.  Psychiatric/Behavioral: Negative for behavioral problems and confusion.    Vital Signs: BP 100/67   Pulse 70   Temp 98 F (36.7 C) (Oral)   Resp 18   Ht 6' (1.829 m)   Wt 188 lb (85.3 kg)   SpO2 98%   BMI 25.50 kg/m   Physical Exam Vitals and nursing note reviewed.  Constitutional:      General: He is not in acute distress.    Appearance: Normal appearance. He is not ill-appearing.  HENT:     Mouth/Throat:     Mouth: Mucous membranes are moist.     Pharynx: Oropharynx is clear.  Cardiovascular:     Rate and Rhythm: Normal rate and regular rhythm.  Pulmonary:     Effort: Pulmonary effort is normal. No respiratory distress.     Breath sounds: Normal breath sounds.  Abdominal:     General: Abdomen is flat.     Palpations: Abdomen is soft.  Skin:    General: Skin is warm and dry.  Neurological:     General: No focal deficit present.       Mental Status: He is alert and oriented to person, place, and time. Mental status is at baseline.  Psychiatric:        Mood and Affect: Mood normal.        Behavior: Behavior normal.        Thought Content: Thought content normal.        Judgment: Judgment normal.      MD Evaluation Airway: WNL Heart: WNL Abdomen: WNL Chest/ Lungs: WNL ASA  Classification: 3 Mallampati/Airway Score: Two   Imaging: CT ANGIO NECK W OR WO CONTRAST  Result Date: 11/05/2019 CLINICAL DATA:  Carotid artery stenosis.  BATwire study. EXAM: CT ANGIOGRAPHY NECK TECHNIQUE: Multidetector CT imaging of the neck was performed using the standard protocol during bolus administration of intravenous contrast. Multiplanar CT image reconstructions and MIPs were obtained to evaluate the vascular anatomy. Carotid stenosis measurements (when applicable) are obtained utilizing NASCET criteria, using the distal internal carotid diameter as the denominator. CONTRAST:  40mL OMNIPAQUE IOHEXOL 350 MG/ML SOLN COMPARISON:  None. FINDINGS: Aortic arch: Normal variant 4 vessel aortic arch with the left vertebral artery arising from the arch. Moderate calcified and soft plaque in the arch. Right carotid system: Patent with small volume, predominantly calcified plaque at the carotid bifurcation and in the carotid bulb without evidence of significant stenosis or dissection. Tortuous distal cervical ICA. Carotid bifurcation is located below the level of the mandible. Left carotid system: Patent with small volume calcified plaque in the mid to distal common carotid artery and at the carotid bifurcation. No evidence of significant stenosis or dissection. Carotid bifurcation is located below the level of the mandible. Vertebral arteries: The vertebral arteries are patent with the right being strongly dominant. There is nonstenotic calcified plaque in the proximal right V1 segment. The left vertebral artery is diffusely hypoplastic with calcified  plaque at its origin resulting in likely severe stenosis. Skeleton: Diffuse ankylosis of the cervical and  included thoracic spine. Other neck: 2 adjacent masses in the posterior right parotid gland measure 12 and 7 mm. No evidence of cervical lymphadenopathy. Upper chest: Mild centrilobular emphysema. IMPRESSION: 1. Mild cervical carotid artery atherosclerosis without significant stenosis. Both carotid bifurcations are located below the level of the mandible. 2. Wide patency of the dominant right vertebral artery. 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 3. Two adjacent right parotid masses measuring 12 and 7 mm, possibly small primary parotid neoplasms (such as Warthin's tumors) or lymph nodes. 4. Aortic Atherosclerosis (ICD10-I70.0) and Emphysema (ICD10-J43.9). Electronically Signed   By: Logan Bores M.D.   On: 11/05/2019 09:16   ECHOCARDIOGRAM COMPLETE  Result Date: 10/29/2019    ECHOCARDIOGRAM REPORT   Patient Name:   Brandon [name] [place] Date of Exam: 10/29/2019 Medical Rec #:  [id number]      Height:       72.0 in Accession #:    [id number]     Weight:       188.0 lb Date of Birth:  1956/02/11       BSA:          2.075 m Patient Age:    64 years       BP:           113/76 mmHg Patient Gender: M              HR:           81 bpm. Exam Location:  Outpatient Procedure: 2D Echo, Cardiac Doppler and Color Doppler Indications:    BatWire research study  History:        Patient has prior history of Echocardiogram examinations, most                 recent 08/09/2019. Previous Myocardial Infarction, Defibrillator,                 Aortic Valve Disease; Risk Factors:Hypertension and Former                 Smoker. CKD. Ischemic cardiomyopathy.  Sonographer:    Clayton Lefort RDCS (AE) Referring Phys: [place]  1. Basal function most preserved. Septal apical and inferior wall akinesis . Left ventricular ejection fraction, by estimation, is 25 to 30%. The left ventricle has  severely decreased function. The left ventricle demonstrates regional wall motion abnormalities (see scoring diagram/findings for description). The left ventricular internal cavity size was moderately dilated. There is moderate asymmetric left ventricular hypertrophy of the basal and septal segments. Left ventricular diastolic parameters are consistent with Grade I diastolic dysfunction (impaired relaxation). Elevated left ventricular end-diastolic pressure.  2. AICD catheter in RV/RA. Right ventricular systolic function is normal. The right ventricular size is normal.  3. The mitral valve is abnormal. Trivial mitral valve regurgitation.  4. Lower gradient moderate AS due to low EF DVI 0.39 . The aortic valve was not well visualized. Aortic valve regurgitation is not visualized. Moderate aortic valve stenosis. FINDINGS  Left Ventricle: Basal function most preserved. Septal apical and inferior wall akinesis. Left ventricular ejection fraction, by estimation, is 25 to 30%. The left ventricle has severely decreased function. The left ventricle demonstrates regional wall motion abnormalities. The left ventricular internal cavity size was moderately dilated. There is moderate asymmetric left ventricular hypertrophy of the basal and septal segments. Left ventricular diastolic parameters are consistent with Grade I diastolic dysfunction (impaired relaxation). Elevated left ventricular end-diastolic pressure. Right Ventricle: AICD catheter in RV/RA.  The right ventricular size is normal. No increase in right ventricular wall thickness. Right ventricular systolic function is normal. Left Atrium: Left atrial size was normal in size. Right Atrium: Right atrial size was normal in size. Pericardium: There is no evidence of pericardial effusion. Mitral Valve: The mitral valve is abnormal. There is moderate thickening of the mitral valve leaflet(s). There is moderate calcification of the mitral valve leaflet(s). Moderate mitral  annular calcification. Trivial mitral valve regurgitation. MV peak gradient, 7.1 mmHg. The mean mitral valve gradient is 2.0 mmHg. Tricuspid Valve: The tricuspid valve is normal in structure. Tricuspid valve regurgitation is mild. Aortic Valve: Lower gradient moderate AS due to low EF DVI 0.39. The aortic valve was not well visualized. . There is severe thickening and severe calcifcation of the aortic valve. Aortic valve regurgitation is not visualized. Moderate aortic stenosis is  present. There is severe thickening of the aortic valve. There is severe calcifcation of the aortic valve. Aortic valve mean gradient measures 9.7 mmHg. Aortic valve peak gradient measures 17.4 mmHg. Aortic valve area, by VTI measures 1.49 cm. Pulmonic Valve: The pulmonic valve was not well visualized. Pulmonic valve regurgitation is trivial. Aorta: The aortic root is normal in size and structure. IAS/Shunts: The interatrial septum was not well visualized.  LEFT VENTRICLE PLAX 2D LVIDd:         4.80 cm      Diastology LVIDs:         4.10 cm      LV e' lateral:   3.89 cm/s LV PW:         1.30 cm      LV E/e' lateral: 20.8 LV IVS:        1.30 cm      LV e' medial:    5.11 cm/s LVOT diam:     2.20 cm      LV E/e' medial:  15.8 LV SV:         59 LV SV Index:   28 LVOT Area:     3.80 cm  LV Volumes (MOD) LV vol d, MOD A2C: 157.0 ml LV vol d, MOD A4C: 158.0 ml LV vol s, MOD A2C: 109.0 ml LV vol s, MOD A4C: 116.0 ml LV SV MOD A2C:     48.0 ml LV SV MOD A4C:     158.0 ml LV SV MOD BP:      46.8 ml RIGHT VENTRICLE RV Basal diam:  3.30 cm TAPSE (M-mode): 2.0 cm LEFT ATRIUM             Index       RIGHT ATRIUM           Index LA diam:        2.70 cm 1.30 cm/m  RA Area:     16.80 cm LA Vol (A2C):   65.8 ml 31.71 ml/m RA Volume:   40.20 ml  19.37 ml/m LA Vol (A4C):   51.9 ml 25.01 ml/m LA Biplane Vol: 58.3 ml 28.09 ml/m  AORTIC VALVE AV Area (Vmax):    1.43 cm AV Area (Vmean):   1.34 cm AV Area (VTI):     1.49 cm AV Vmax:           208.67  cm/s AV Vmean:          145.667 cm/s AV VTI:            0.396 m AV Peak Grad:      17.4 mmHg AV Mean Grad:  9.7 mmHg LVOT Vmax:         78.30 cm/s LVOT Vmean:        51.500 cm/s LVOT VTI:          0.155 m LVOT/AV VTI ratio: 0.39  AORTA Ao Root diam: 3.50 cm Ao Asc diam:  3.40 cm MITRAL VALVE MV Area (PHT): 4.17 cm     SHUNTS MV Peak grad:  7.1 mmHg     Systemic VTI:  0.16 m MV Mean grad:  2.0 mmHg     Systemic Diam: 2.20 cm MV Vmax:       1.33 m/s MV Vmean:      54.2 cm/s MV Decel Time: 182 msec MV E velocity: 80.80 cm/s MV A velocity: 124.00 cm/s MV E/A ratio:  0.65 Jenkins Rouge MD Electronically signed by Jenkins Rouge MD Signature Date/Time: 10/29/2019/4:46:26 PM    Final    VAS US CAROTID  Result Date: 10/29/2019 Carotid Arterial Duplex Study Indications:                           Batwire research. Risk Factors:                          Hypertension, hyperlipidemia, coronary                                        artery disease. Pre-Surgical Evaluation & Surgical     Right bifurcation is located near the Correlation                            Hyoid Notch. Left bifurcation is located                                        near the Hyoid Notch. Right distal CCA to                                        bifurcation 0.88 cm, right bifurcation to                                        proximal ICA 0.79 cm.                                        Left distal CCA to bifurcation 1.2 cm,                                        left bifurcation to proximal ICA 0.71. Performing Technologist: Oda Cogan RDMS, RVT  Examination Guidelines: A complete evaluation includes B-mode imaging, spectral Doppler, color Doppler, and power Doppler as needed of all accessible portions of each vessel. Bilateral testing is considered an integral part of a complete examination. Limited examinations for reoccurring indications may be performed as noted.  Right Carotid Findings:  +----------+--------+--------+--------+------------------+--------+  PSV cm/sEDV cm/sStenosisPlaque DescriptionComments +----------+--------+--------+--------+------------------+--------+ CCA Prox  114     17                                         +----------+--------+--------+--------+------------------+--------+ CCA Distal97      10                                         +----------+--------+--------+--------+------------------+--------+ ICA Prox  53      9       1-39%   hyperechoic                +----------+--------+--------+--------+------------------+--------+ ICA Mid   60      19                                         +----------+--------+--------+--------+------------------+--------+ ICA Distal62      19                                         +----------+--------+--------+--------+------------------+--------+ ECA       51      8                                          +----------+--------+--------+--------+------------------+--------+ +----------+--------+-------+----------------+-------------------+           PSV cm/sEDV cmsDescribe        Arm Pressure (mmHG) +----------+--------+-------+----------------+-------------------+ [id number]             Multiphasic, WNL                    +----------+--------+-------+----------------+-------------------+ +---------+--------+--+--------+--+---------+ VertebralPSV cm/s37EDV cm/s17Antegrade +---------+--------+--+--------+--+---------+  Left Carotid Findings: +----------+--------+--------+--------+------------------+--------+           PSV cm/sEDV cm/sStenosisPlaque DescriptionComments +----------+--------+--------+--------+------------------+--------+ CCA Prox  109     14                                         +----------+--------+--------+--------+------------------+--------+ CCA Distal103     20                                          +----------+--------+--------+--------+------------------+--------+ ICA Prox  36      12      1-39%   hypoechoic                 +----------+--------+--------+--------+------------------+--------+ ICA Mid   52      18                                         +----------+--------+--------+--------+------------------+--------+ ICA Distal49      15                                         +----------+--------+--------+--------+------------------+--------+  ECA       55      12                                         +----------+--------+--------+--------+------------------+--------+ +----------+--------+--------+----------------+-------------------+           PSV cm/sEDV cm/sDescribe        Arm Pressure (mmHG) +----------+--------+--------+----------------+-------------------+ [id number]              Multiphasic, WNL                    +----------+--------+--------+----------------+-------------------+ +---------+--------+--+--------+---------+ VertebralPSV cm/s31EDV cm/sAntegrade +---------+--------+--+--------+---------+   Summary: Right Carotid: Velocities in the right ICA are consistent with a 1-39% stenosis. Left Carotid: Velocities in the left ICA are consistent with a 1-39% stenosis. Vertebrals: Bilateral vertebral arteries demonstrate antegrade flow. *See table(s) above for measurements and observations.  Electronically signed by Servando Snare MD on 10/29/2019 at 4:22:21 PM.    Final     Labs:  CBC: Recent Labs    05/04/19 0541 05/20/19 1624 08/09/19 1258 11/24/19 1222  WBC 7.3 8.3 7.2 7.8  HGB 12.2* 15.0 15.7 16.6  HCT 35.8* 44.6 48.7 52.2*  PLT 187 274 199 212    COAGS: Recent Labs    04/27/19 1208  INR 1.0  APTT 31    BMP: Recent Labs    10/12/19 0933 10/13/19 1125 10/29/19 1415 11/10/19 1215  NA 136 137 137 136  K 3.9 4.3 4.0 3.7  CL 101 101 101 104  CO2 24 22 20 25   GLUCOSE 111* 86 98 103*  BUN 12 13 15 13   CALCIUM 9.3 9.7  9.4 9.3  CREATININE 0.89 0.85 0.81 0.78  GFRNONAA >60 91 93 >60  GFRAA >60 105 107 >60    LIVER FUNCTION TESTS: Recent Labs    04/27/19 1208 04/27/19 2036 06/10/19 1000  BILITOT 0.6 1.2 0.6  AST 25 480* 20  ALT 25 103* 22  ALKPHOS 88 91 82  PROT 6.9 7.0 6.7  ALBUMIN 3.7 3.6 3.6    TUMOR MARKERS: No results for input(s): AFPTM, CEA, CA199, CHROMGRNA in the last 8760 hours.  Assessment and Plan: Patient with past medical history of NSTEMI/STEMI, AICD placement presents with complaint of right parotid mass found incidentally on work-up for research trial participation related to his history of acute coronary syndrome.  IR consulted for right parotid mass biopsy at the request of Dr. Chauncy Passy. Case reviewed by Dr. Anselm Pancoast who approves patient for procedure.  Patient presents today in their usual state of health.  He has been NPO and is not currently on blood thinners.   Risks and benefits discussed with the patient and/or patient's family including, but not limited to bleeding, infection, damage to adjacent structures or low yield requiring additional tests.  All of the questions were answered and there is agreement to proceed.  Consent signed and in chart.  Thank you for this interesting consult.  I greatly enjoyed meeting Brandon [name] and look forward to participating in their care.  A copy of this report was sent to the requesting provider on this date.  Electronically Signed: Docia Barrier, PA 11/24/2019, 12:52 PM   I spent a total of  30 Minutes   in face to face in clinical consultation, greater than 50% of which was counseling/coordinating care for parotid mass.

## 2019-11-24 NOTE — Progress Notes (Signed)
Pt given instructions.  To have friend pick up.  Pt discharged via wheelchair at 1515.

## 2019-11-25 ENCOUNTER — Telehealth: Payer: Self-pay | Admitting: *Deleted

## 2019-11-25 DIAGNOSIS — Z006 Encounter for examination for normal comparison and control in clinical research program: Secondary | ICD-10-CM

## 2019-11-25 LAB — CYTOLOGY - NON PAP

## 2019-11-25 NOTE — Telephone Encounter (Signed)
Pt informed that his NT pro BNP was to low to qualify for the [employer] trail.

## 2019-12-01 ENCOUNTER — Ambulatory Visit (INDEPENDENT_AMBULATORY_CARE_PROVIDER_SITE_OTHER): Payer: Medicare Other | Admitting: *Deleted

## 2019-12-01 DIAGNOSIS — I255 Ischemic cardiomyopathy: Secondary | ICD-10-CM

## 2019-12-01 LAB — CUP PACEART REMOTE DEVICE CHECK
Battery Remaining Longevity: 97 mo
Battery Remaining Percentage: 94 %
Battery Voltage: 3.02 V
Brady Statistic RV Percent Paced: 0 %
Date Time Interrogation Session: 20210721032055
HighPow Impedance: 75 Ohm
Implantable Lead Implant Date: 20210420
Implantable Lead Location: 753860
Implantable Pulse Generator Implant Date: 20210420
Lead Channel Impedance Value: 490 Ohm
Lead Channel Pacing Threshold Amplitude: 0.5 V
Lead Channel Pacing Threshold Pulse Width: 0.5 ms
Lead Channel Sensing Intrinsic Amplitude: 11.6 mV
Lead Channel Setting Pacing Amplitude: 3.5 V
Lead Channel Setting Pacing Pulse Width: 0.5 ms
Lead Channel Setting Sensing Sensitivity: 0.5 mV
Pulse Gen Serial Number: 111013950

## 2019-12-02 ENCOUNTER — Telehealth: Payer: Self-pay | Admitting: Student

## 2019-12-02 NOTE — Telephone Encounter (Signed)
Interventional Radiology notified that patient was having some discomfort in his right face/neck. He had a fine needle aspiration of a parotid mass done 11/24/19. The patient was called for further inquiry.  Patient stated that the first few days after the procedure he didn't have any issues. After 3-4 days he noticed some swelling and discomfort. He states the swelling seems to be enlarging and the area has become increasingly tender. He mentioned that he slept on that side one night. Patient denies any difficulties swallowing, chewing, breathing, or moving his neck.   Dr. Anselm Pancoast spoke with Dr. Lucia Gaskins with ENT. Dr. Lucia Gaskins is available to see the patient tomorrow. Patient notified that he could either call Dr. Lucia Gaskins to be seen tomorrow or he could be seen by an IR doctor at [facility] or [facility]. Patient stated he would call Dr. Pollie Friar office for an appointment tomorrow.  Patient advised to call Interventional Radiology with any other questions or concerns.   - Soyla Dryer, NP [facility]

## 2019-12-03 ENCOUNTER — Other Ambulatory Visit: Payer: Self-pay

## 2019-12-03 ENCOUNTER — Ambulatory Visit (INDEPENDENT_AMBULATORY_CARE_PROVIDER_SITE_OTHER): Payer: Medicare Other | Admitting: Otolaryngology

## 2019-12-03 VITALS — Temp 96.4°F

## 2019-12-03 DIAGNOSIS — I255 Ischemic cardiomyopathy: Secondary | ICD-10-CM | POA: Diagnosis not present

## 2019-12-03 DIAGNOSIS — L0211 Cutaneous abscess of neck: Secondary | ICD-10-CM | POA: Diagnosis not present

## 2019-12-03 DIAGNOSIS — L03221 Cellulitis of neck: Secondary | ICD-10-CM

## 2019-12-03 NOTE — Progress Notes (Signed)
HPI: Brandon [name] is a 64 y.o. male who returns today for evaluation of right posterior neck pain and erythema status post fine-needle aspirate of a right parotid tumor performed 1 week ago.  The report of the fine-needle aspirate revealed findings consistent with a Warthin's tumor.  Patient has had a previous Warthin's tumor removed from the left side which was much larger.  Reviewed Warthin's tumor with him.  This does not necessarily need to be removed unless it enlarges and represents a benign tumor..  Past Medical History:  Diagnosis Date  . AICD (automatic cardioverter/defibrillator) present 08/31/2019  . Ankylosing spondylitis ([place])   . Arthritis   . BENIGN PROSTATIC HYPERTROPHY 06/07/2008  . COLONIC POLYPS, HX OF 06/07/2008  . H/O hiatal hernia   . Heart failure ([place])   . HYPERLIPIDEMIA 06/07/2008  . HYPERTENSION 06/07/2008  . Myocardial infarction [facility]) 2005   NSTEMI, s/p LAD stent  . NEPHROLITHIASIS, HX OF 06/07/2008  . STEMI (ST elevation myocardial infarction) ([place]) 04/27/2019   Past Surgical History:  Procedure Laterality Date  . CORONARY ANGIOPLASTY WITH STENT PLACEMENT  2005   LAD stent, jailed diagonal  . CORONARY/GRAFT ACUTE MI REVASCULARIZATION N/A 04/27/2019   Procedure: Coronary/Graft Acute MI Revascularization;  Surgeon: [name], MD;  Location: [place] [facility];  Service: Cardiovascular;  Laterality: N/A;  . ICD IMPLANT  08/31/2019  . ICD IMPLANT N/A 08/31/2019   Procedure: ICD IMPLANT;  Surgeon: [name], MD;  Location: [place] [facility];  Service: Cardiovascular;  Laterality: N/A;  . LAMINECTOMY     lumbar  . LEFT HEART CATH AND CORONARY ANGIOGRAPHY N/A 04/27/2019   Procedure: LEFT HEART CATH AND CORONARY ANGIOGRAPHY;  Surgeon: [name], MD;  Location: [place] [facility];  Service: Cardiovascular;  Laterality: N/A;  . LUMBAR FUSION  01/2012  . RIGHT HEART CATH N/A 04/27/2019   Procedure: RIGHT HEART CATH;  Surgeon: [name], Marvell M,  MD;  Location: [place] [facility];  Service: Cardiovascular;  Laterality: N/A;  . TONSILLECTOMY    . warthin tumor removal Left 2014   Social History   Socioeconomic History  . Marital status: Single    Spouse name: Not on file  . Number of children: Not on file  . Years of education: Not on file  . Highest education level: Not on file  Occupational History  . 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 Smoking status: Former Smoker    Packs/day: 0.50    Years: 40.00    Pack years: 20.00    Types: Cigarettes    Quit date: 07/26/2019    Years since quitting: 0.3  . Smokeless tobacco: Never Used  Vaping Use  . Vaping Use: Never used  Substance and Sexual Activity  . Alcohol use: Yes    Comment: daily  . Drug use: No  . Sexual activity: Not on file  Other Topics Concern  . Not on file  Social History Narrative   Lives in [place] alone   Works part time for a [employer] in [place] Strain:   . Difficulty of Paying Living Expenses:   Food Insecurity: No Landscape architect  . Worried About Charity fundraiser in the Last Year: Never true  . Ran Out of Food in the Last Year: Never true  Transportation Needs: No Transportation Needs  . Lack of Transportation (Medical): No  . Lack  of Transportation (Non-Medical): No  Physical Activity: Sufficiently Active  . Days of Exercise per Week: 5 days  . Minutes of Exercise per Session: 40 min  Stress:   . Feeling of Stress :   Social Connections:   . Frequency of Communication with Friends and Family:   . Frequency of Social Gatherings with Friends and Family:   . Attends Religious Services:   . Active Member of Clubs or Organizations:   . Attends Archivist Meetings:   [name] Marital Status:    Family History  Problem Relation Age of Onset  . Depression Father   . Arthritis Sister        RA  . Heart failure Mother   . Other  Neg Hx   . Colon cancer Neg Hx   . Esophageal cancer Neg Hx   . Rectal cancer Neg Hx   . Stomach cancer Neg Hx    No Known Allergies Prior to Admission medications   Medication Sig Start Date End Date Taking? Authorizing Provider  acetaminophen (TYLENOL) 325 MG tablet Take 1-2 tablets (325-650 mg total) by mouth every 4 (four) hours as needed for mild pain. 09/01/19  Yes Shirley Friar, PA-C  aspirin 81 MG chewable tablet Chew 1 tablet (81 mg total) by mouth daily. 05/05/19  Yes Bensimhon, Shaune Pascal, MD  atorvastatin (LIPITOR) 80 MG tablet TAKE 1 TABLET BY MOUTH ONCE DAILY AT 6PM. 05/21/19  Yes Rosita Fire, Brittainy M, PA-C  Carboxymethylcellulose Sodium (ARTIFICIAL TEARS OP) Place 1 drop into both eyes daily as needed (dry eyes).   Yes [provider]  carvedilol (COREG) 3.125 MG tablet Take 1 tablet (3.125 mg total) by mouth 2 (two) times daily. 05/21/19  Yes Lyda Jester M, PA-C  clopidogrel (PLAVIX) 75 MG tablet take 75 mg by mouth daily. 09/01/19  Yes Shirley Friar, PA-C  dapagliflozin propanediol (FARXIGA) 10 MG TABS tablet Take 10 mg by mouth daily. 09/21/19  Yes Bensimhon, Shaune Pascal, MD  digoxin (LANOXIN) 0.125 MG tablet TAKE 1 TABLET BY MOUTH DAILY 08/20/19  Yes Bensimhon, Shaune Pascal, MD  diphenhydrAMINE (BENADRYL) 25 MG tablet Take 50 mg by mouth at bedtime as needed for sleep.   Yes [provider]  ENTRESTO 24-26 MG TAKE 1 TABLET BY MOUTH 2 (TWO) TIMES DAILY. 10/28/19  Yes Bensimhon, Shaune Pascal, MD  eplerenone (INSPRA) 50 MG tablet Take 1 tablet (50 mg total) by mouth daily. 09/06/19  Yes Larey Dresser, MD  ezetimibe (ZETIA) 10 MG tablet Take 1 tablet (10 mg total) by mouth daily. 06/17/19  Yes Hilty, Nadean Corwin, MD  furosemide (LASIX) 20 MG tablet Take 2 tablets (40 mg total) by mouth as needed. 05/21/19  Yes Lyda Jester M, PA-C  inFLIXimab in sodium chloride 0.9 % Inject into the vein every 6 (six) weeks.   Yes [provider]  loratadine  (CLARITIN) 10 MG tablet Take 10 mg by mouth daily as needed for allergies.   Yes [provider]  Melatonin 10 MG CAPS Take 10 mg by mouth at bedtime as needed (sleep).   Yes [provider]  tamsulosin (FLOMAX) 0.4 MG CAPS capsule TAKE 1 CAPSULE(0.4 MG) BY MOUTH DAILY 07/19/19  Yes [name], MD     Positive ROS: Otherwise negative  All other systems have been reviewed and were otherwise negative with the exception of those mentioned in the HPI and as above.  Physical Exam: Constitutional: Alert, well-appearing, no acute distress Ears: External ears without lesions  or tenderness. Ear canals are clear bilaterally with intact, clear TMs.  Nasal: External nose without lesions. Clear nasal passages Oral: Lips and gums without lesions. Tongue and palate mucosa without lesions. Posterior oropharynx clear. Neck: No palpable adenopathy or masses.  Patient with erythema swelling where the fine-needle aspirate was performed consistent with cellulitis questionable abscess. Respiratory: Breathing comfortably  Skin: No facial/neck lesions or rash noted.  Procedures  Assessment: Patient with infection status post ultrasound-guided fine-needle aspirate of right parotid mass.  Plan: Placed him on Keflex 500 mg 3 times daily for the next week. He will notify us if this worsens over the weekend.   Radene Journey, MD

## 2019-12-03 NOTE — Progress Notes (Signed)
Remote ICD transmission.   

## 2019-12-06 ENCOUNTER — Encounter: Payer: Medicare Other | Admitting: Internal Medicine

## 2019-12-06 ENCOUNTER — Other Ambulatory Visit: Payer: Self-pay

## 2019-12-14 ENCOUNTER — Telehealth: Payer: Self-pay

## 2019-12-14 ENCOUNTER — Telehealth (HOSPITAL_COMMUNITY): Payer: Self-pay | Admitting: Pharmacy Technician

## 2019-12-14 NOTE — Telephone Encounter (Signed)
Spoke with pt regarding virtual appt on 12/15/19. Pt stated he would check his vitals prior to his appt. Pt questions were address and pt confirmed virtual visit.

## 2019-12-14 NOTE — Telephone Encounter (Addendum)
Spoke with patient this morning. He thought based off the information his pharmacy relayed to him that his grant had no more funds available. He still has over $600 in funds to use for [place]. Called the patient back and informed him that [facility] needs to bill his grant like they have before. The patient requested that I call and help them with this, as he tried to explain it to the technician that helped him when he went to pick up the [place].  I called [facility] and was on hold for 45 minutes and no one picked up the phone. I called the front of the store to ask for some assistance, the gentleman went to speak with the pharmacy and never returned to the phone.  I updated the patient with what happened. He is going to [facility] to [facility] to [facility] his [place] and call me while he is there if need be to get his grant billed. After this fill, we are likely going to try and switch his [place] over to [facility].  Charlann Boxer, CPhT

## 2019-12-15 ENCOUNTER — Telehealth (INDEPENDENT_AMBULATORY_CARE_PROVIDER_SITE_OTHER): Payer: Medicare Other | Admitting: Internal Medicine

## 2019-12-15 ENCOUNTER — Other Ambulatory Visit: Payer: Self-pay

## 2019-12-15 ENCOUNTER — Encounter: Payer: Self-pay | Admitting: Internal Medicine

## 2019-12-15 VITALS — BP 113/77 | HR 93 | Ht 72.0 in | Wt 186.0 lb

## 2019-12-15 DIAGNOSIS — I255 Ischemic cardiomyopathy: Secondary | ICD-10-CM

## 2019-12-15 DIAGNOSIS — I519 Heart disease, unspecified: Secondary | ICD-10-CM | POA: Diagnosis not present

## 2019-12-15 NOTE — Progress Notes (Signed)
Electrophysiology TeleHealth Note   Due to national recommendations of social distancing due to COVID 19, an audio/video telehealth visit is felt to be most appropriate for this patient at this time.  See MyChart message from today for the patient's consent to telehealth for [facility].  Date:  12/15/2019   ID:  Brandon [name], DOB March 24, 1956, MRN [id number]  Location: patient's home  Provider location:  [place]  Evaluation Performed: Follow-up visit  PCP:  [name], MD   Electrophysiologist:  Dr Rayann Heman  Chief Complaint:  palpitations  History of Present Illness:    Brandon [name] is a 64 y.o. male who presents via telehealth conferencing today.  Since his ICD implant, the patient reports doing very well.  He denies procedure related complications.  + SOB with moderate activity.  He is looking forward to Barostim device implant in order to improve his quality of life.  Today, he denies symptoms of palpitations, chest pain,  lower extremity edema, dizziness, presyncope, or syncope.  The patient is otherwise without complaint today.   Past Medical History:  Diagnosis Date  . AICD (automatic cardioverter/defibrillator) present 08/31/2019  . Ankylosing spondylitis ([place])   . Arthritis   . BENIGN PROSTATIC HYPERTROPHY 06/07/2008  . COLONIC POLYPS, HX OF 06/07/2008  . H/O hiatal hernia   . Heart failure ([place])   . HYPERLIPIDEMIA 06/07/2008  . HYPERTENSION 06/07/2008  . Myocardial infarction [facility]) 2005   NSTEMI, s/p LAD stent  . NEPHROLITHIASIS, HX OF 06/07/2008  . STEMI (ST elevation myocardial infarction) ([place]) 04/27/2019    Past Surgical History:  Procedure Laterality Date  . CORONARY ANGIOPLASTY WITH STENT PLACEMENT  2005   LAD stent, jailed diagonal  . CORONARY/GRAFT ACUTE MI REVASCULARIZATION N/A 04/27/2019   Procedure: Coronary/Graft Acute MI Revascularization;  Surgeon: [name], MD;  Location: [place] [facility];  Service: Cardiovascular;   Laterality: N/A;  . ICD IMPLANT  08/31/2019  . ICD IMPLANT N/A 08/31/2019   Procedure: ICD IMPLANT;  Surgeon: [name], MD;  Location: [place] [facility];  Service: Cardiovascular;  Laterality: N/A;  . LAMINECTOMY     lumbar  . LEFT HEART CATH AND CORONARY ANGIOGRAPHY N/A 04/27/2019   Procedure: LEFT HEART CATH AND CORONARY ANGIOGRAPHY;  Surgeon: [name], MD;  Location: [place] [facility];  Service: Cardiovascular;  Laterality: N/A;  . LUMBAR FUSION  01/2012  . RIGHT HEART CATH N/A 04/27/2019   Procedure: RIGHT HEART CATH;  Surgeon: [name], Daxten M, MD;  Location: [place] [facility];  Service: Cardiovascular;  Laterality: N/A;  . TONSILLECTOMY    . warthin tumor removal Left 2014    Current Outpatient Medications  Medication Sig Dispense Refill  . acetaminophen (TYLENOL) 325 MG tablet Take 1-2 tablets (325-650 mg total) by mouth every 4 (four) hours as needed for mild pain.    [name] aspirin 81 MG chewable tablet Chew 1 tablet (81 mg total) by mouth daily. 30 tablet 3  . atorvastatin (LIPITOR) 80 MG tablet TAKE 1 TABLET BY MOUTH ONCE DAILY AT 6PM. 90 tablet 3  . Carboxymethylcellulose Sodium (ARTIFICIAL TEARS OP) Place 1 drop into both eyes daily as needed (dry eyes).    . carvedilol (COREG) 3.125 MG tablet Take 1 tablet (3.125 mg total) by mouth 2 (two) times daily. 180 tablet 3  . clopidogrel (PLAVIX) 75 MG tablet take 75 mg by mouth daily. 90 tablet 3  . dapagliflozin propanediol (FARXIGA) 10 MG TABS tablet Take  10 mg by mouth daily. 30 tablet 11  . digoxin (LANOXIN) 0.125 MG tablet TAKE 1 TABLET BY MOUTH DAILY 90 tablet 3  . diphenhydrAMINE (BENADRYL) 25 MG tablet Take 50 mg by mouth at bedtime as needed for sleep.    [name] ENTRESTO 24-26 MG TAKE 1 TABLET BY MOUTH 2 (TWO) TIMES DAILY. 180 tablet 3  . eplerenone (INSPRA) 50 MG tablet Take 1 tablet (50 mg total) by mouth daily. 90 tablet 3  . ezetimibe (ZETIA) 10 MG tablet Take 1 tablet (10 mg total) by mouth daily. 90 tablet 3    . furosemide (LASIX) 20 MG tablet Take 2 tablets (40 mg total) by mouth as needed. 180 tablet 0  . inFLIXimab in sodium chloride 0.9 % Inject into the vein every 6 (six) weeks.    [name] loratadine (CLARITIN) 10 MG tablet Take 10 mg by mouth daily as needed for allergies.    . Melatonin 10 MG CAPS Take 10 mg by mouth at bedtime as needed (sleep).    . tamsulosin (FLOMAX) 0.4 MG CAPS capsule TAKE 1 CAPSULE(0.4 MG) BY MOUTH DAILY 90 capsule 1   No current facility-administered medications for this visit.    Allergies:   Patient has no known allergies.   Social History:  The patient  reports that he quit smoking about 4 months ago. His smoking use included cigarettes. He has a 20.00 pack-year smoking history. He has never used smokeless tobacco. He reports current alcohol use. He reports that he does not use drugs.   ROS:  Please see the history of present illness.   All other systems are personally reviewed and negative.    Exam:    Vital Signs:  BP 113/77   Pulse 93   Ht 6' (1.829 m)   Wt 186 lb (84.4 kg)   BMI 25.23 kg/m   Well sounding and appearing, alert and conversant, regular work of breathing,  good skin color Eyes- anicteric, neuro- grossly intact, skin- no apparent rash or lesions or cyanosis, mouth- oral mucosa is pink  Labs/Other Tests and Data Reviewed:    Recent Labs: 04/27/2019: TSH 0.889 04/28/2019: Magnesium 1.9 06/10/2019: ALT 22 10/13/2019: NT-Pro BNP 483 11/10/2019: B Natriuretic Peptide 186.1; BUN 13; Creatinine, Ser 0.78; Potassium 3.7; Sodium 136 11/24/2019: Hemoglobin 16.6; Platelets 212   Wt Readings from Last 3 Encounters:  12/15/19 186 lb (84.4 kg)  11/24/19 188 lb (85.3 kg)  11/10/19 188 lb 12.8 oz (85.6 kg)     Last device remote is reviewed from [place] PDF which reveals normal device function, no arrhythmias    ASSESSMENT & PLAN:    1.  Ischemic CM/ chronic systolic dysfunction with NYHA Class III CHF Doing well s/p ICD Remotes are up to  date No arrhythmias or therapy delivered Will need to reduce RV lead output to chronic settings on return He is planned for Barostim therapy later this month     Follow-up:  Return to see EP PA in 6 months I will see in a year   Patient Risk:  after full review of this patients clinical status, I feel that they are at moderate risk at this time.  Today, I have spent 15 minutes with the patient with telehealth technology discussing arrhythmia management .    Army Fossa, MD  12/15/2019 12:22 PM     [place] [address] [phone] (office) [phone] (fax)

## 2019-12-16 ENCOUNTER — Other Ambulatory Visit (HOSPITAL_COMMUNITY): Payer: Self-pay | Admitting: Surgery

## 2019-12-16 DIAGNOSIS — I5022 Chronic systolic (congestive) heart failure: Secondary | ICD-10-CM

## 2019-12-16 MED ORDER — DAPAGLIFLOZIN PROPANEDIOL 10 MG PO TABS: 10.0000 mg | ORAL_TABLET | Freq: Every day | ORAL | 11 refills | Status: DC

## 2019-12-17 ENCOUNTER — Encounter (INDEPENDENT_AMBULATORY_CARE_PROVIDER_SITE_OTHER): Payer: Self-pay

## 2019-12-22 ENCOUNTER — Encounter: Payer: Self-pay | Admitting: Internal Medicine

## 2019-12-22 NOTE — Progress Notes (Signed)
PERIOPERATIVE PRESCRIPTION FOR IMPLANTED CARDIAC DEVICE PROGRAMMING  Patient Information: Name:  Brandon [name]  DOB:  Jul 09, 1955  MRN:  [id number]   Planned Procedure: INSERTION OF [name]  Surgeon: Dr. Harold Barban  Date of Procedure: 12/31/19  Cautery will be used.    Please send documentation back to:  [facility] (Fax # [phone])    Device Information:  Clinic EP Physician:  [name], MD  Device Type:  Defibrillator Manufacturer and Phone #:  [employer] Jude/[employer]: [phone] Pacemaker Dependent?:  No. Date of Last Device Check:  12/01/2019 Normal Device Function?:  Yes.    Electrophysiologist's Recommendations:   Have magnet available.  Provide continuous ECG monitoring when magnet is used or reprogramming is to be performed.   Procedure will likely interfere with device function.  Device should be programmed:  Tachy therapies disabled. Please contact [employer] representative for reprogramming.  Per Device Clinic [address] Mechele Dawley, RN  5:55 PM 12/22/2019

## 2019-12-22 NOTE — Progress Notes (Signed)
Emailed device clinic requesting orders concerning upcoming Barostim insertion procedure.

## 2019-12-22 NOTE — Pre-Procedure Instructions (Signed)
Your procedure is scheduled on Friday, August 20, from 07:30 AM- 09:33 AM.  Report to [facility] Main Entrance "A" at 05:30 A.M., and check in at the Admitting office.  Call this number if you have problems the morning of surgery:  [phone]  Call [phone] if you have any questions prior to your surgery date Monday-Friday 8am-4pm.    Remember:  Do not eat or drink after midnight the night before your surgery.     Take these medicines the morning of surgery with A SIP OF WATER: carvedilol (COREG) citalopram (CELEXA) Aspirin digoxin (LANOXIN) ezetimibe (ZETIA) tamsulosin (FLOMAX)    IF NEEDED: acetaminophen (TYLENOL) loratadine (CLARITIN) Carboxymethylcellulose Sodium (ARTIFICIAL TEARS OP)    *Hold clopidogrel (PLAVIX) 5 days prior to surgery.  As of today, STOP taking any Aleve, Naproxen, Ibuprofen, Motrin, Advil, Goody's, BC's, all herbal medications, fish oil, and all vitamins.   WHAT DO I DO ABOUT MY DIABETES MEDICATION?  [name] Hold dapagliflozin propanediol (FARXIGA) the day before and the morning of surgery.   HOW TO MANAGE YOUR DIABETES BEFORE AND AFTER SURGERY  Why is it important to control my blood sugar before and after surgery? . Improving blood sugar levels before and after surgery helps healing and can limit problems. . A way of improving blood sugar control is eating a healthy diet by: o  Eating less sugar and carbohydrates o  Increasing activity/exercise o  Talking with your doctor about reaching your blood sugar goals . High blood sugars (greater than 180 mg/dL) can raise your risk of infections and slow your recovery, so you will need to focus on controlling your diabetes during the weeks before surgery. . Make sure that the doctor who takes care of your diabetes knows about your planned surgery including the date and location.  How do I manage my blood sugar before surgery? . Check your blood sugar at least 4 times a day, starting 2 days  before surgery, to make sure that the level is not too high or low. . Check your blood sugar the morning of your surgery when you wake up and every 2 hours until you get to the Short Stay unit. o If your blood sugar is less than 70 mg/dL, you will need to treat for low blood sugar: - Do not take insulin. - Treat a low blood sugar (less than 70 mg/dL) with  cup of clear juice (cranberry or apple), 4 glucose tablets, OR glucose gel. - Recheck blood sugar in 15 minutes after treatment (to make sure it is greater than 70 mg/dL). If your blood sugar is not greater than 70 mg/dL on recheck, call [phone] for further instructions. . Report your blood sugar to the short stay nurse when you get to Short Stay.  . If you are admitted to the hospital after surgery: o Your blood sugar will be checked by the staff and you will probably be given insulin after surgery (instead of oral diabetes medicines) to make sure you have good blood sugar levels. o The goal for blood sugar control after surgery is 80-180 mg/dL.           The Morning of Surgery:             Do not wear jewelry.            Do not wear lotions, powders, colognes, or deodorant.            Men may shave face and neck.  Do not bring valuables to the hospital.            [facility] is not responsible for any belongings or valuables.  Do NOT Smoke (Tobacco/Vaping) or drink Alcohol 24 hours prior to your procedure.  If you use a CPAP at night, you may bring all equipment for your overnight stay.   Contacts, glasses, dentures or bridgework may not be worn into surgery.      For patients admitted to the hospital, discharge time will be determined by your treatment team.   Patients discharged the day of surgery will not be allowed to drive home, and someone needs to stay with them for 24 hours.    Special instructions:   [place]- Preparing For Surgery  Before surgery, you can play an important role. Because skin is  not sterile, your skin needs to be as free of germs as possible. You can reduce the number of germs on your skin by washing with CHG (chlorahexidine gluconate) Soap before surgery.  CHG is an antiseptic cleaner which kills germs and bonds with the skin to continue killing germs even after washing.    Oral Hygiene is also important to reduce your risk of infection.  Remember - BRUSH YOUR TEETH THE MORNING OF SURGERY WITH YOUR REGULAR TOOTHPASTE  Please do not use if you have an allergy to CHG or antibacterial soaps. If your skin becomes reddened/irritated stop using the CHG.  Do not shave (including legs and underarms) for at least 48 hours prior to first CHG shower. It is OK to shave your face.  Please follow these instructions carefully.   1. Shower the NIGHT BEFORE SURGERY and the MORNING OF SURGERY with CHG Soap.   2. If you chose to wash your hair, wash your hair first as usual with your normal shampoo.  3. After you shampoo, rinse your hair and body thoroughly to remove the shampoo.  4. Use CHG as you would any other liquid soap. You can apply CHG directly to the skin and wash gently with a scrungie or a clean washcloth.   5. Apply the CHG Soap to your body ONLY FROM THE NECK DOWN.  Do not use on open wounds or open sores. Avoid contact with your eyes, ears, mouth and genitals (private parts). Wash Face and genitals (private parts)  with your normal soap.   6. Wash thoroughly, paying special attention to the area where your surgery will be performed.  7. Thoroughly rinse your body with warm water from the neck down.  8. DO NOT shower/wash with your normal soap after using and rinsing off the CHG Soap.  9. Pat yourself dry with a CLEAN TOWEL.  10. Wear CLEAN PAJAMAS to bed the night before surgery  11. Place CLEAN SHEETS on your bed the night of your first shower and DO NOT SLEEP WITH PETS.   Day of Surgery: Wear Clean/Comfortable clothing the morning of surgery. Do not apply  any deodorants/lotions.   Remember to brush your teeth WITH YOUR REGULAR TOOTHPASTE.   Please read over the following fact sheets that you were given.

## 2019-12-22 NOTE — Progress Notes (Signed)
[place] [phone] to notify of upcoming procedure.

## 2019-12-22 NOTE — Progress Notes (Signed)
Brandon [name] Device rep called back to confirm she will be there DOS (12/31/19).

## 2019-12-23 ENCOUNTER — Encounter (HOSPITAL_COMMUNITY)
Admission: RE | Admit: 2019-12-23 | Discharge: 2019-12-23 | Disposition: A | Discharge status: Home / Self Care | Payer: Medicare Other | Source: Ambulatory Visit | Attending: Surgery | Admitting: Surgery

## 2019-12-23 ENCOUNTER — Other Ambulatory Visit: Payer: Self-pay

## 2019-12-23 ENCOUNTER — Encounter (HOSPITAL_COMMUNITY): Payer: Self-pay

## 2019-12-23 DIAGNOSIS — Z01812 Encounter for preprocedural laboratory examination: Secondary | ICD-10-CM | POA: Insufficient documentation

## 2019-12-23 HISTORY — DX: Heart failure, unspecified: I50.9

## 2019-12-23 HISTORY — DX: Depression, unspecified: F32.A

## 2019-12-23 LAB — PROTIME-INR
INR: 1.1 (ref 0.8–1.2)
Prothrombin Time: 13.5 seconds (ref 11.4–15.2)

## 2019-12-23 LAB — COMPREHENSIVE METABOLIC PANEL
ALT: 19 U/L (ref 0–44)
AST: 17 U/L (ref 15–41)
Albumin: 4.1 g/dL (ref 3.5–5.0)
Alkaline Phosphatase: 80 U/L (ref 38–126)
Anion gap: 8 (ref 5–15)
BUN: 10 mg/dL (ref 8–23)
CO2: 27 mmol/L (ref 22–32)
Calcium: 9.4 mg/dL (ref 8.9–10.3)
Chloride: 102 mmol/L (ref 98–111)
Creatinine, Ser: 0.93 mg/dL (ref 0.61–1.24)
GFR calc Af Amer: >60 mL/min (ref >60)
GFR calc non Af Amer: >60 mL/min (ref >60)
Glucose, Bld: 94 mg/dL (ref 70–99)
Potassium: 4.1 mmol/L (ref 3.5–5.1)
Sodium: 137 mmol/L (ref 135–145)
Total Bilirubin: 1.1 mg/dL (ref 0.3–1.2)
Total Protein: 7.1 g/dL (ref 6.5–8.1)

## 2019-12-23 LAB — SURGICAL PCR SCREEN
MRSA, PCR: NEGATIVE
Staphylococcus aureus: POSITIVE — AB

## 2019-12-23 LAB — URINALYSIS, ROUTINE W REFLEX MICROSCOPIC
Bacteria, UA: NONE SEEN
Bilirubin Urine: NEGATIVE
Glucose, UA: >=500 mg/dL — AB
Hgb urine dipstick: NEGATIVE
Ketones, ur: NEGATIVE mg/dL
Leukocytes,Ua: NEGATIVE
Nitrite: NEGATIVE
Protein, ur: NEGATIVE mg/dL
Specific Gravity, Urine: 1.026 (ref 1.005–1.030)
pH: 5 (ref 5.0–8.0)

## 2019-12-23 LAB — CBC
HCT: 50.9 % (ref 39.0–52.0)
Hemoglobin: 16.3 g/dL (ref 13.0–17.0)
MCH: 29.5 pg (ref 26.0–34.0)
MCHC: 32 g/dL (ref 30.0–36.0)
MCV: 92 fL (ref 80.0–100.0)
Platelets: 220 10*3/uL (ref 150–400)
RBC: 5.53 MIL/uL (ref 4.22–5.81)
RDW: 14 % (ref 11.5–15.5)
WBC: 7.1 10*3/uL (ref 4.0–10.5)
nRBC: 0 % (ref 0.0–0.2)

## 2019-12-23 LAB — TYPE AND SCREEN
ABO/RH(D): O NEG
Antibody Screen: NEGATIVE

## 2019-12-23 LAB — APTT: aPTT: 35 seconds (ref 24–36)

## 2019-12-23 NOTE — Progress Notes (Signed)
PCP - Dr. Volanda Napoleon Cardiologist - Dr. [name] EP - Dr. Rayann Heman  PPM/ICD - ICD Device Orders - orders in Epic Rep Notified - Dionne Milo Notified  Chest x-ray - 09/01/19 EKG - 10/13/19 Stress Test - 11/04/2011 ECHO - 10/29/2019 Cardiac Cath - 04/27/2019  Sleep Study - n/a CPAP -   Fasting Blood Sugar - n/a Checks Blood Sugar _____ times a day  Blood Thinner Instructions: hold plavix 5 days Aspirin Instructions: continue  ERAS Protcol - n/a,  NPO p MN PRE-SURGERY Ensure or G2-   COVID TEST- Thursday 8/19   Anesthesia review: yes, cardiac history; recent ICD 08/2019  Patient denies shortness of breath, fever, cough and chest pain at PAT appointment   All instructions explained to the patient, with a verbal understanding of the material. Patient agrees to go over the instructions while at home for a better understanding. Patient also instructed to self quarantine after being tested for COVID-19. The opportunity to ask questions was provided.

## 2019-12-24 NOTE — Progress Notes (Signed)
Anesthesia Chart Review:  Follows with cardiology for hx of CAD ( s/p recent anterior STEMI 12/20. LHC showed chronically occluded RCA (with L>>R collaterals) and thrombotic occlusion of proximal LAD. Underwent PCI of LAD), mild-mod AS, Ischemic CM, chronic systolic dysfunction with NYHA Class III CHF s/p St. Jude single chamber ICD on 08/31/2019 by Dr. Rayann Heman. Doing well at followup with Dr. Rayann Heman 12/15/19, discussed upcoming Barostim implant. EF 25-30% and low gradient moderate AS by echo 10/29/19.  History of ankylosing spondylitis and difficulty with full neck extension.   Preop labs reviewed, unremarkable.   EKG 10/13/19: NSR. Rate 72.  Periop EP device orders: Device Information:  Clinic EP Physician:  [name], MD  Device Type:  Defibrillator Manufacturer and Phone #:  St. Jude/Abbott: [phone] Pacemaker Dependent?:  No. Date of Last Device Check:  12/01/2019       Normal Device Function?:  Yes.    Electrophysiologist's Recommendations:   Have magnet available.  Provide continuous ECG monitoring when magnet is used or reprogramming is to be performed.   Procedure will likely interfere with device function.  Device should be programmed:  Tachy therapies disabled. Please contact [place] representative for reprogramming.  TTE 10/29/19: 1. Basal function most preserved. Septal apical and inferior wall  akinesis . Left ventricular ejection fraction, by estimation, is 25 to  30%. The left ventricle has severely decreased function. The left  ventricle demonstrates regional wall motion  abnormalities (see scoring diagram/findings for description). The left  ventricular internal cavity size was moderately dilated. There is moderate  asymmetric left ventricular hypertrophy of the basal and septal segments.  Left ventricular diastolic  parameters are consistent with Grade I diastolic dysfunction (impaired  relaxation). Elevated left ventricular end-diastolic pressure.   2. AICD catheter in RV/RA. Right ventricular systolic function is normal.  The right ventricular size is normal.  3. The mitral valve is abnormal. Trivial mitral valve regurgitation.  4. Lower gradient moderate AS due to low EF DVI 0.39 . The aortic valve  was not well visualized. Aortic valve regurgitation is not visualized.  Moderate aortic valve stenosis.  LHC 04/27/2019:   Prox RCA to Mid RCA lesion is 100% stenosed. THis is a chronic total occlusion. Left to right collaterals.  Prox Cx lesion is 50% stenosed.  Mid LAD lesion is 100% stenosed.  A drug-eluting stent was successfully placed using a STENT SYNERGY DES 3.5X16.  Post intervention, there is a 0% residual stenosis.  LV end diastolic pressure is moderately elevated.  There is no aortic valve stenosis.   He will need echo and aggressive medical therapy for LV dysfunction.    Wynonia Musty [facility] Short Stay Center/Anesthesiology Phone [phone] 12/24/2019 1:40 PM

## 2019-12-24 NOTE — Anesthesia Preprocedure Evaluation (Addendum)
Anesthesia Evaluation  Patient identified by MRN, date of birth, ID band Patient awake    Reviewed: Allergy & Precautions, NPO status , Patient's Chart, lab work & pertinent test results  Airway Mallampati: II  TM Distance: >3 FB Neck ROM: Full    Dental  (+) Dental Advisory Given   Pulmonary former smoker,    breath sounds clear to auscultation       Cardiovascular hypertension, Pt. on medications and Pt. on home beta blockers + CAD, + Past MI and +CHF  + Cardiac Defibrillator + Valvular Problems/Murmurs AS  Rhythm:Regular Rate:Normal     Neuro/Psych negative neurological ROS     GI/Hepatic Neg liver ROS, hiatal hernia,   Endo/Other  negative endocrine ROS  Renal/GU negative Renal ROS     Musculoskeletal  (+) Arthritis ,   Abdominal   Peds  Hematology negative hematology ROS (+)   Anesthesia Other Findings   Reproductive/Obstetrics                            Lab Results  Component Value Date   WBC 7.1 12/23/2019   HGB 16.3 12/23/2019   HCT 50.9 12/23/2019   MCV 92.0 12/23/2019   PLT 220 12/23/2019   Lab Results  Component Value Date   CREATININE 0.93 12/23/2019   BUN 10 12/23/2019   NA 137 12/23/2019   K 4.1 12/23/2019   CL 102 12/23/2019   CO2 27 12/23/2019    Anesthesia Physical Anesthesia Plan  ASA: IV  Anesthesia Plan: General   Post-op Pain Management:    Induction: Intravenous  PONV Risk Score and Plan: 2 and Dexamethasone, Ondansetron and Treatment may vary due to age or medical condition  Airway Management Planned: Oral ETT and Video Laryngoscope Planned  Additional Equipment: Arterial line  Intra-op Plan:   Post-operative Plan: Extubation in OR and Possible Post-op intubation/ventilation  Informed Consent: I have reviewed the patients History and Physical, chart, labs and discussed the procedure including the risks, benefits and alternatives for the  proposed anesthesia with the patient or authorized representative who has indicated his/her understanding and acceptance.     Dental advisory given  Plan Discussed with: CRNA  Anesthesia Plan Comments: (2IV's and an a-line. Etomidate and rocuronium induction. Remifentanil and nitrous maintenance. Plan on glidescope intubation given hx of ankylosing spondylitis  PAT note by Karoline Caldwell, PA-C:   Follows with cardiology for hx of CAD ( s/p recent anterior STEMI 12/20. LHC showed chronically occluded RCA (with L>>R collaterals) and thrombotic occlusion of proximal LAD. Underwent PCI of LAD), mild-mod AS, Ischemic CM, chronic systolic dysfunction with NYHA Class III CHF s/p St. Jude single chamber ICD on 08/31/2019 by Dr. Rayann Heman. Doing well at followup with Dr. Rayann Heman 12/15/19, discussed upcoming Barostim implant. EF 25-30% and low gradient moderate AS by echo 10/29/19.  History of ankylosing spondylitis and difficulty with full neck extension.   Preop labs reviewed, unremarkable.   EKG 10/13/19: NSR. Rate 72.  Periop EP device orders: Device Information:  Clinic EP Physician:  [name], MD  Device Type:  Defibrillator Manufacturer and Phone #:  St. Jude/Abbott: [phone] Pacemaker Dependent?:  No. Date of Last Device Check:  12/01/2019       Normal Device Function?:  Yes.    Electrophysiologist's Recommendations:  Have magnet available. Provide continuous ECG monitoring when magnet is used or reprogramming is to be performed.  Procedure will likely interfere with device function.  Device should  be programmed:  Tachy therapies disabled. Please contact [place] representative for reprogramming.  TTE 10/29/19: 1. Basal function most preserved. Septal apical and inferior wall  akinesis . Left ventricular ejection fraction, by estimation, is 25 to  30%. The left ventricle has severely decreased function. The left  ventricle demonstrates regional wall motion  abnormalities  (see scoring diagram/findings for description). The left  ventricular internal cavity size was moderately dilated. There is moderate  asymmetric left ventricular hypertrophy of the basal and septal segments.  Left ventricular diastolic  parameters are consistent with Grade I diastolic dysfunction (impaired  relaxation). Elevated left ventricular end-diastolic pressure.  2. AICD catheter in RV/RA. Right ventricular systolic function is normal.  The right ventricular size is normal.  3. The mitral valve is abnormal. Trivial mitral valve regurgitation.  4. Lower gradient moderate AS due to low EF DVI 0.39 . The aortic valve  was not well visualized. Aortic valve regurgitation is not visualized.  Moderate aortic valve stenosis.  LHC 04/27/2019:  Prox RCA to Mid RCA lesion is 100% stenosed. THis is a chronic total occlusion. Left to right collaterals. Prox Cx lesion is 50% stenosed. Mid LAD lesion is 100% stenosed. A drug-eluting stent was successfully placed using a STENT SYNERGY DES 3.5X16. Post intervention, there is a 0% residual stenosis. LV end diastolic pressure is moderately elevated. There is no aortic valve stenosis.   He will need echo and aggressive medical therapy for LV dysfunction.  )      Anesthesia Quick Evaluation

## 2019-12-29 ENCOUNTER — Other Ambulatory Visit: Payer: Self-pay | Admitting: Family Medicine

## 2019-12-30 ENCOUNTER — Encounter (HOSPITAL_COMMUNITY): Payer: Self-pay | Admitting: Surgery

## 2019-12-30 ENCOUNTER — Other Ambulatory Visit (HOSPITAL_COMMUNITY)
Admission: RE | Admit: 2019-12-30 | Discharge: 2019-12-30 | Disposition: A | Discharge status: Home / Self Care | Payer: Medicare Other | Source: Ambulatory Visit | Attending: Surgery | Admitting: Surgery

## 2019-12-30 DIAGNOSIS — Z20822 Contact with and (suspected) exposure to covid-19: Secondary | ICD-10-CM | POA: Insufficient documentation

## 2019-12-30 DIAGNOSIS — Z01812 Encounter for preprocedural laboratory examination: Secondary | ICD-10-CM | POA: Insufficient documentation

## 2019-12-30 LAB — SARS CORONAVIRUS 2 (TAT 6-24 HRS): SARS Coronavirus 2: NEGATIVE

## 2019-12-31 ENCOUNTER — Encounter (HOSPITAL_COMMUNITY)
Admission: RE | Disposition: A | Discharge status: Home / Self Care | Payer: Self-pay | Source: Home / Self Care | Attending: Surgery

## 2019-12-31 ENCOUNTER — Inpatient Hospital Stay (HOSPITAL_COMMUNITY): Payer: Medicare Other | Admitting: Physician Assistant

## 2019-12-31 ENCOUNTER — Other Ambulatory Visit: Payer: Self-pay

## 2019-12-31 ENCOUNTER — Encounter (HOSPITAL_COMMUNITY): Payer: Self-pay | Admitting: Surgery

## 2019-12-31 ENCOUNTER — Observation Stay (HOSPITAL_COMMUNITY)
Admission: RE | Admit: 2019-12-31 | Discharge: 2019-12-31 | Disposition: A | Discharge status: Home / Self Care | Payer: Medicare Other | Attending: Surgery | Admitting: Surgery

## 2019-12-31 ENCOUNTER — Inpatient Hospital Stay (HOSPITAL_COMMUNITY): Payer: Medicare Other | Admitting: Certified Registered Nurse Anesthetist

## 2019-12-31 DIAGNOSIS — Z79899 Other long term (current) drug therapy: Secondary | ICD-10-CM | POA: Diagnosis not present

## 2019-12-31 DIAGNOSIS — Z981 Arthrodesis status: Secondary | ICD-10-CM | POA: Diagnosis not present

## 2019-12-31 DIAGNOSIS — Z7984 Long term (current) use of oral hypoglycemic drugs: Secondary | ICD-10-CM | POA: Diagnosis not present

## 2019-12-31 DIAGNOSIS — M1990 Unspecified osteoarthritis, unspecified site: Secondary | ICD-10-CM | POA: Diagnosis not present

## 2019-12-31 DIAGNOSIS — Z955 Presence of coronary angioplasty implant and graft: Secondary | ICD-10-CM | POA: Diagnosis not present

## 2019-12-31 DIAGNOSIS — Z87891 Personal history of nicotine dependence: Secondary | ICD-10-CM | POA: Insufficient documentation

## 2019-12-31 DIAGNOSIS — E785 Hyperlipidemia, unspecified: Secondary | ICD-10-CM | POA: Insufficient documentation

## 2019-12-31 DIAGNOSIS — I110 Hypertensive heart disease with heart failure: Secondary | ICD-10-CM | POA: Diagnosis not present

## 2019-12-31 DIAGNOSIS — Z8249 Family history of ischemic heart disease and other diseases of the circulatory system: Secondary | ICD-10-CM | POA: Insufficient documentation

## 2019-12-31 DIAGNOSIS — I519 Heart disease, unspecified: Secondary | ICD-10-CM

## 2019-12-31 DIAGNOSIS — Z8261 Family history of arthritis: Secondary | ICD-10-CM | POA: Diagnosis not present

## 2019-12-31 DIAGNOSIS — Z7982 Long term (current) use of aspirin: Secondary | ICD-10-CM | POA: Diagnosis not present

## 2019-12-31 DIAGNOSIS — I255 Ischemic cardiomyopathy: Secondary | ICD-10-CM

## 2019-12-31 DIAGNOSIS — M459 Ankylosing spondylitis of unspecified sites in spine: Secondary | ICD-10-CM | POA: Insufficient documentation

## 2019-12-31 DIAGNOSIS — I252 Old myocardial infarction: Secondary | ICD-10-CM | POA: Diagnosis not present

## 2019-12-31 DIAGNOSIS — I5022 Chronic systolic (congestive) heart failure: Secondary | ICD-10-CM | POA: Diagnosis not present

## 2019-12-31 DIAGNOSIS — I509 Heart failure, unspecified: Secondary | ICD-10-CM

## 2019-12-31 SURGERY — INSERTION, CAROTID SINUS BAROREFLEX ACTIVATION DEVICE
Anesthesia: General | Site: Neck | Laterality: Right

## 2019-12-31 MED ORDER — SUGAMMADEX SODIUM 200 MG/2ML IV SOLN
INTRAVENOUS | Status: DC | PRN
  Administered 2019-12-31: 200 mg via INTRAVENOUS

## 2019-12-31 MED ORDER — LIDOCAINE 2% (20 MG/ML) 5 ML SYRINGE
INTRAMUSCULAR | Status: AC
  Filled 2019-12-31: qty 5

## 2019-12-31 MED ORDER — CEFAZOLIN SODIUM-DEXTROSE 2-4 GM/100ML-% IV SOLN
2.0000 g | Freq: Three times a day (TID) | INTRAVENOUS | Status: DC
  Administered 2019-12-31: 2 g via INTRAVENOUS
  Filled 2019-12-31: qty 100

## 2019-12-31 MED ORDER — FENTANYL CITRATE (PF) 100 MCG/2ML IJ SOLN: 25.0000 ug | INTRAMUSCULAR | Status: DC | PRN

## 2019-12-31 MED ORDER — EZETIMIBE 10 MG PO TABS: 10.0000 mg | ORAL_TABLET | Freq: Every day | ORAL | Status: DC

## 2019-12-31 MED ORDER — CARVEDILOL 3.125 MG PO TABS
3.1250 mg | ORAL_TABLET | Freq: Two times a day (BID) | ORAL | Status: DC
  Administered 2019-12-31: 3.125 mg via ORAL
  Filled 2019-12-31: qty 1

## 2019-12-31 MED ORDER — LACTATED RINGERS IV SOLN: INTRAVENOUS | Status: DC | PRN

## 2019-12-31 MED ORDER — DOCUSATE SODIUM 100 MG PO CAPS: 100.0000 mg | ORAL_CAPSULE | Freq: Every day | ORAL | Status: DC

## 2019-12-31 MED ORDER — ROCURONIUM BROMIDE 10 MG/ML (PF) SYRINGE
PREFILLED_SYRINGE | INTRAVENOUS | Status: DC | PRN
  Administered 2019-12-31: 20 mg via INTRAVENOUS
  Administered 2019-12-31: 50 mg via INTRAVENOUS

## 2019-12-31 MED ORDER — AMISULPRIDE (ANTIEMETIC) 5 MG/2ML IV SOLN: 10.0000 mg | Freq: Once | INTRAVENOUS | Status: DC | PRN

## 2019-12-31 MED ORDER — CLOPIDOGREL BISULFATE 75 MG PO TABS: 75.0000 mg | ORAL_TABLET | Freq: Every day | ORAL | Status: DC

## 2019-12-31 MED ORDER — ONDANSETRON HCL 4 MG/2ML IJ SOLN
INTRAMUSCULAR | Status: AC
  Filled 2019-12-31: qty 2

## 2019-12-31 MED ORDER — NEOSTIGMINE METHYLSULFATE 3 MG/3ML IV SOSY
PREFILLED_SYRINGE | INTRAVENOUS | Status: AC
  Filled 2019-12-31: qty 3

## 2019-12-31 MED ORDER — SACUBITRIL-VALSARTAN 24-26 MG PO TABS
1.0000 | ORAL_TABLET | Freq: Two times a day (BID) | ORAL | Status: DC
  Administered 2019-12-31: 1 via ORAL
  Filled 2019-12-31: qty 1

## 2019-12-31 MED ORDER — ACETAMINOPHEN 650 MG RE SUPP: 325.0000 mg | RECTAL | Status: DC | PRN

## 2019-12-31 MED ORDER — ETOMIDATE 2 MG/ML IV SOLN
INTRAVENOUS | Status: DC | PRN
  Administered 2019-12-31: 16 mg via INTRAVENOUS

## 2019-12-31 MED ORDER — SODIUM CHLORIDE 0.9 % IV SOLN: 500.0000 mL | Freq: Once | INTRAVENOUS | Status: DC | PRN

## 2019-12-31 MED ORDER — GLYCOPYRROLATE PF 0.2 MG/ML IJ SOSY
PREFILLED_SYRINGE | INTRAMUSCULAR | Status: AC
  Filled 2019-12-31: qty 1

## 2019-12-31 MED ORDER — ARTIFICIAL TEARS OPHTHALMIC OINT
TOPICAL_OINTMENT | OPHTHALMIC | Status: AC
  Filled 2019-12-31: qty 3.5

## 2019-12-31 MED ORDER — PANTOPRAZOLE SODIUM 40 MG PO TBEC
40.0000 mg | DELAYED_RELEASE_TABLET | Freq: Every day | ORAL | Status: DC
  Administered 2019-12-31: 40 mg via ORAL
  Filled 2019-12-31: qty 1

## 2019-12-31 MED ORDER — LABETALOL HCL 5 MG/ML IV SOLN: 10.0000 mg | INTRAVENOUS | Status: DC | PRN

## 2019-12-31 MED ORDER — BISACODYL 10 MG RE SUPP: 10.0000 mg | Freq: Every day | RECTAL | Status: DC | PRN

## 2019-12-31 MED ORDER — EPHEDRINE 5 MG/ML INJ
INTRAVENOUS | Status: AC
  Filled 2019-12-31: qty 10

## 2019-12-31 MED ORDER — CHLORHEXIDINE GLUCONATE CLOTH 2 % EX PADS: 6.0000 | MEDICATED_PAD | Freq: Once | CUTANEOUS | Status: DC

## 2019-12-31 MED ORDER — LIDOCAINE 2% (20 MG/ML) 5 ML SYRINGE
INTRAMUSCULAR | Status: DC | PRN
  Administered 2019-12-31: 60 mg via INTRAVENOUS

## 2019-12-31 MED ORDER — ALUM & MAG HYDROXIDE-SIMETH 200-200-20 MG/5ML PO SUSP: 15.0000 mL | ORAL | Status: DC | PRN

## 2019-12-31 MED ORDER — CEFAZOLIN SODIUM-DEXTROSE 2-4 GM/100ML-% IV SOLN
2.0000 g | INTRAVENOUS | Status: AC
  Administered 2019-12-31: 2 g via INTRAVENOUS
  Filled 2019-12-31: qty 100

## 2019-12-31 MED ORDER — EPHEDRINE SULFATE-NACL 50-0.9 MG/10ML-% IV SOSY
PREFILLED_SYRINGE | INTRAVENOUS | Status: DC | PRN
  Administered 2019-12-31 (×4): 5 mg via INTRAVENOUS

## 2019-12-31 MED ORDER — FENTANYL CITRATE (PF) 250 MCG/5ML IJ SOLN
INTRAMUSCULAR | Status: AC
  Filled 2019-12-31: qty 5

## 2019-12-31 MED ORDER — SPIRONOLACTONE 25 MG PO TABS
25.0000 mg | ORAL_TABLET | Freq: Every day | ORAL | Status: DC
  Administered 2019-12-31: 25 mg via ORAL
  Filled 2019-12-31: qty 1

## 2019-12-31 MED ORDER — SODIUM CHLORIDE 0.9 % IV SOLN: INTRAVENOUS | Status: DC

## 2019-12-31 MED ORDER — HYDRALAZINE HCL 20 MG/ML IJ SOLN: 5.0000 mg | INTRAMUSCULAR | Status: DC | PRN

## 2019-12-31 MED ORDER — ONDANSETRON HCL 4 MG/2ML IJ SOLN: 4.0000 mg | Freq: Four times a day (QID) | INTRAMUSCULAR | Status: DC | PRN

## 2019-12-31 MED ORDER — BUPIVACAINE HCL (PF) 0.5 % IJ SOLN
INTRAMUSCULAR | Status: AC
  Filled 2019-12-31: qty 30

## 2019-12-31 MED ORDER — DIPHENHYDRAMINE HCL 25 MG PO TABS
50.0000 mg | ORAL_TABLET | Freq: Every evening | ORAL | Status: DC | PRN
  Filled 2019-12-31 (×2): qty 2

## 2019-12-31 MED ORDER — SUCCINYLCHOLINE CHLORIDE 200 MG/10ML IV SOSY
PREFILLED_SYRINGE | INTRAVENOUS | Status: AC
  Filled 2019-12-31: qty 10

## 2019-12-31 MED ORDER — MIDAZOLAM HCL 2 MG/2ML IJ SOLN
INTRAMUSCULAR | Status: AC
  Filled 2019-12-31: qty 2

## 2019-12-31 MED ORDER — ACETAMINOPHEN 500 MG PO TABS
1000.0000 mg | ORAL_TABLET | Freq: Once | ORAL | Status: AC
  Administered 2019-12-31: 1000 mg via ORAL
  Filled 2019-12-31: qty 2

## 2019-12-31 MED ORDER — FUROSEMIDE 40 MG PO TABS: 40.0000 mg | ORAL_TABLET | Freq: Every day | ORAL | Status: DC | PRN

## 2019-12-31 MED ORDER — ASPIRIN 81 MG PO CHEW: 81.0000 mg | CHEWABLE_TABLET | Freq: Every day | ORAL | Status: DC

## 2019-12-31 MED ORDER — CITALOPRAM HYDROBROMIDE 20 MG PO TABS: 10.0000 mg | ORAL_TABLET | ORAL | Status: DC

## 2019-12-31 MED ORDER — ATORVASTATIN CALCIUM 80 MG PO TABS: 80.0000 mg | ORAL_TABLET | Freq: Every day | ORAL | Status: DC

## 2019-12-31 MED ORDER — ACETAMINOPHEN 325 MG PO TABS: 325.0000 mg | ORAL_TABLET | ORAL | Status: DC | PRN

## 2019-12-31 MED ORDER — SENNOSIDES-DOCUSATE SODIUM 8.6-50 MG PO TABS: 1.0000 | ORAL_TABLET | Freq: Every evening | ORAL | Status: DC | PRN

## 2019-12-31 MED ORDER — ETOMIDATE 2 MG/ML IV SOLN
INTRAVENOUS | Status: AC
  Filled 2019-12-31: qty 10

## 2019-12-31 MED ORDER — DEXAMETHASONE SODIUM PHOSPHATE 10 MG/ML IJ SOLN
INTRAMUSCULAR | Status: DC | PRN
  Administered 2019-12-31: 10 mg via INTRAVENOUS

## 2019-12-31 MED ORDER — DAPAGLIFLOZIN PROPANEDIOL 10 MG PO TABS
10.0000 mg | ORAL_TABLET | Freq: Every day | ORAL | Status: DC
  Filled 2019-12-31: qty 1

## 2019-12-31 MED ORDER — TAMSULOSIN HCL 0.4 MG PO CAPS: 0.4000 mg | ORAL_CAPSULE | Freq: Every day | ORAL | Status: DC

## 2019-12-31 MED ORDER — ROCURONIUM BROMIDE 10 MG/ML (PF) SYRINGE
PREFILLED_SYRINGE | INTRAVENOUS | Status: AC
  Filled 2019-12-31: qty 10

## 2019-12-31 MED ORDER — PROPOFOL 10 MG/ML IV BOLUS
INTRAVENOUS | Status: AC
  Filled 2019-12-31: qty 20

## 2019-12-31 MED ORDER — DIGOXIN 125 MCG PO TABS: 0.1250 mg | ORAL_TABLET | Freq: Every day | ORAL | Status: DC

## 2019-12-31 MED ORDER — 0.9 % SODIUM CHLORIDE (POUR BTL) OPTIME
TOPICAL | Status: DC | PRN
  Administered 2019-12-31: 1000 mL

## 2019-12-31 MED ORDER — SODIUM CHLORIDE 0.9 % IV SOLN
INTRAVENOUS | Status: AC
  Filled 2019-12-31: qty 500000

## 2019-12-31 MED ORDER — GUAIFENESIN-DM 100-10 MG/5ML PO SYRP: 15.0000 mL | ORAL_SOLUTION | ORAL | Status: DC | PRN

## 2019-12-31 MED ORDER — SODIUM CHLORIDE 0.9 % IV SOLN
0.0500 ug/kg/min | INTRAVENOUS | Status: AC
  Administered 2019-12-31: .2 ug/kg/min via INTRAVENOUS
  Filled 2019-12-31: qty 5000

## 2019-12-31 MED ORDER — PHENYLEPHRINE 40 MCG/ML (10ML) SYRINGE FOR IV PUSH (FOR BLOOD PRESSURE SUPPORT)
PREFILLED_SYRINGE | INTRAVENOUS | Status: AC
  Filled 2019-12-31: qty 10

## 2019-12-31 MED ORDER — PHENOL 1.4 % MT LIQD: 1.0000 | OROMUCOSAL | Status: DC | PRN

## 2019-12-31 MED ORDER — PHENYLEPHRINE 40 MCG/ML (10ML) SYRINGE FOR IV PUSH (FOR BLOOD PRESSURE SUPPORT)
PREFILLED_SYRINGE | INTRAVENOUS | Status: DC | PRN
  Administered 2019-12-31 (×3): 40 ug via INTRAVENOUS

## 2019-12-31 MED ORDER — CHLORHEXIDINE GLUCONATE 0.12 % MT SOLN
OROMUCOSAL | Status: AC
  Administered 2019-12-31: 15 mL
  Filled 2019-12-31: qty 15

## 2019-12-31 MED ORDER — DEXAMETHASONE SODIUM PHOSPHATE 10 MG/ML IJ SOLN
INTRAMUSCULAR | Status: AC
  Filled 2019-12-31: qty 1

## 2019-12-31 MED ORDER — MAGNESIUM SULFATE 2 GM/50ML IV SOLN: 2.0000 g | Freq: Every day | INTRAVENOUS | Status: DC | PRN

## 2019-12-31 MED ORDER — OXYCODONE-ACETAMINOPHEN 10-325 MG PO TABS: 1.0000 | ORAL_TABLET | Freq: Four times a day (QID) | ORAL | 0 refills | Status: DC | PRN

## 2019-12-31 MED ORDER — MORPHINE SULFATE (PF) 2 MG/ML IV SOLN: 2.0000 mg | INTRAVENOUS | Status: DC | PRN

## 2019-12-31 MED ORDER — METOPROLOL TARTRATE 5 MG/5ML IV SOLN: 2.0000 mg | INTRAVENOUS | Status: DC | PRN

## 2019-12-31 MED ORDER — LIDOCAINE HCL (PF) 1 % IJ SOLN
INTRAMUSCULAR | Status: AC
  Filled 2019-12-31: qty 5

## 2019-12-31 MED ORDER — SODIUM CHLORIDE (PF) 0.9 % IJ SOLN
INTRAMUSCULAR | Status: AC
  Filled 2019-12-31: qty 10

## 2019-12-31 MED ORDER — SODIUM CHLORIDE 0.9 % IV SOLN
INTRAVENOUS | Status: DC | PRN
  Administered 2019-12-31: 500 mL

## 2019-12-31 MED ORDER — ONDANSETRON HCL 4 MG/2ML IJ SOLN
INTRAMUSCULAR | Status: DC | PRN
  Administered 2019-12-31: 4 mg via INTRAVENOUS

## 2019-12-31 MED ORDER — MIDAZOLAM HCL 5 MG/5ML IJ SOLN
INTRAMUSCULAR | Status: DC | PRN
  Administered 2019-12-31: 2 mg via INTRAVENOUS

## 2019-12-31 MED ORDER — POTASSIUM CHLORIDE CRYS ER 20 MEQ PO TBCR: 20.0000 meq | EXTENDED_RELEASE_TABLET | Freq: Every day | ORAL | Status: DC | PRN

## 2019-12-31 MED ORDER — OXYCODONE-ACETAMINOPHEN 5-325 MG PO TABS
1.0000 | ORAL_TABLET | ORAL | Status: DC | PRN
  Administered 2019-12-31 (×2): 2 via ORAL
  Filled 2019-12-31 (×2): qty 2

## 2019-12-31 SURGICAL SUPPLY — 40 items
CANISTER SUCT 3000ML PPV (MISCELLANEOUS) ×2 IMPLANT
CLIP VESOCCLUDE MED 6/CT (CLIP) ×1 IMPLANT
CLIP VESOCCLUDE SM WIDE 6/CT (CLIP) ×1 IMPLANT
COVER SURGICAL LIGHT HANDLE (MISCELLANEOUS) ×1 IMPLANT
COVER WAND RF STERILE (DRAPES) ×1 IMPLANT
DERMABOND ADVANCED (GAUZE/BANDAGES/DRESSINGS) ×1
DERMABOND ADVANCED .7 DNX12 (GAUZE/BANDAGES/DRESSINGS) ×1 IMPLANT
DRAPE INCISE IOBAN 66X45 STRL (DRAPES) ×1 IMPLANT
ELECT REM PT RETURN 9FT ADLT (ELECTROSURGICAL) ×2
ELECTRODE REM PT RTRN 9FT ADLT (ELECTROSURGICAL) ×1 IMPLANT
GENERATOR IPG BAROSTIM 2102 (Generator) ×1 IMPLANT
GLOVE BIOGEL PI IND STRL 7.5 (GLOVE) ×1 IMPLANT
GLOVE BIOGEL PI INDICATOR 7.5 (GLOVE) ×1
GLOVE SURG SS PI 7.5 STRL IVOR (GLOVE) ×2 IMPLANT
GOWN STRL REUS W/ TWL LRG LVL3 (GOWN DISPOSABLE) ×3 IMPLANT
GOWN STRL REUS W/ TWL XL LVL3 (GOWN DISPOSABLE) ×1 IMPLANT
GOWN STRL REUS W/TWL LRG LVL3 (GOWN DISPOSABLE) ×6
GOWN STRL REUS W/TWL XL LVL3 (GOWN DISPOSABLE) ×2
HEMOSTAT SNOW SURGICEL 2X4 (HEMOSTASIS) IMPLANT
KIT BASIN OR (CUSTOM PROCEDURE TRAY) ×2 IMPLANT
KIT TURNOVER KIT B (KITS) ×2 IMPLANT
LEAD CAROTID BAROSTIM 1036 (Lead) ×1 IMPLANT
MARKER SKIN DUAL TIP RULER LAB (MISCELLANEOUS) ×2 IMPLANT
NEEDLE 18GX1X1/2 (RX/OR ONLY) (NEEDLE) ×3 IMPLANT
NS IRRIG 1000ML POUR BTL (IV SOLUTION) ×3 IMPLANT
PACK CAROTID (CUSTOM PROCEDURE TRAY) ×2 IMPLANT
PAD ARMBOARD 7.5X6 YLW CONV (MISCELLANEOUS) ×4 IMPLANT
POSITIONER HEAD DONUT 9IN (MISCELLANEOUS) ×2 IMPLANT
SUT ETHIBOND CT1 BRD #0 30IN (SUTURE) ×4 IMPLANT
SUT ETHILON 3 0 PS 1 (SUTURE) ×1 IMPLANT
SUT PROLENE 6 0 BV (SUTURE) ×16 IMPLANT
SUT SILK 0 FSL (SUTURE) ×1 IMPLANT
SUT VIC AB 3-0 SH 27 (SUTURE) ×10
SUT VIC AB 3-0 SH 27X BRD (SUTURE) ×2 IMPLANT
SUT VIC AB 4-0 PS2 18 (SUTURE) ×1 IMPLANT
SUT VICRYL 4-0 PS2 18IN ABS (SUTURE) ×3 IMPLANT
SYR 5ML LL (SYRINGE) ×2 IMPLANT
SYR BULB IRRIG 60ML STRL (SYRINGE) ×2 IMPLANT
TOWEL GREEN STERILE (TOWEL DISPOSABLE) ×2 IMPLANT
WATER STERILE IRR 1000ML POUR (IV SOLUTION) ×2 IMPLANT

## 2019-12-31 NOTE — Progress Notes (Signed)
  Day of Surgery Note    Subjective:  Some incisional pain improved with Percocet. Sister at bedside. Has not yet voided. No difficulty swallowing   Vitals:   12/31/19 1030 12/31/19 1135  BP: 117/78 (!) 115/58  Pulse: 83   Resp: 12 16  Temp: 97.6 F (36.4 C) 97.9 F (36.6 C)  SpO2: 96% 97%   Neuro: A and O times 4. Tongue midline. Speech fluent Incisions:   Right neck and chest incisions well approximated. No bleeding. Extremities:  Moves all well Cardiac:  RRR Lungs:  Non labored respirations    Assessment/Plan:  This is a 64 y.o. male who is s/p  Right CCA Barostim placement.  Stable post-op. Continue monitoring.  Risa Grill, PA-C 12/31/2019 12:13 PM [phone]

## 2019-12-31 NOTE — Progress Notes (Signed)
Patient discharged per MD order. A line, IVs and tele removed. Discharge instructions reviewed, questions answered to patient's satisfaction. Patient escorted home by sister in personal vehicle.  Lan Mcneill M

## 2019-12-31 NOTE — H&P (Signed)
[facility] [place]  Patient name: Brandon [name]          MRN: [id number]        DOB: May 18, 1955            Sex: male   REQUESTING PROVIDER:    Dr. Joylene Grapes   REASON FOR CONSULT:    Barostim implant  HISTORY OF PRESENT ILLNESS:   Brandon [name] is a 64 y.o. male, who I saw today for evaluation of implantation of the Batwire device.  He has class III heart failure.  He is on Entresto.  His last echo in March 2021 showed an ejection fraction of 25 to 30%.  He recently had a left-sided ICD placed.  He has shortness of breath with moderate exertion.Brandon [name]  His blood pressure is controlled with medication.  He takes a statin for hypercholesterolemia.  He is a former smoker.  He has ankylosing spondylitis and difficulty with full neck extension.  PAST MEDICAL HISTORY        Past Medical History:  Diagnosis Date  . AICD (automatic cardioverter/defibrillator) present 08/31/2019  . Ankylosing spondylitis ([place])   . Arthritis   . BENIGN PROSTATIC HYPERTROPHY 06/07/2008  . COLONIC POLYPS, HX OF 06/07/2008  . H/O hiatal hernia   . Heart failure ([place])   . HYPERLIPIDEMIA 06/07/2008  . HYPERTENSION 06/07/2008  . Myocardial infarction [facility]) 2005   NSTEMI, s/p LAD stent  . NEPHROLITHIASIS, HX OF 06/07/2008  . STEMI (ST elevation myocardial infarction) ([place]) 04/27/2019     FAMILY HISTORY        Family History  Problem Relation Age of Onset  . Depression Father   . Arthritis Sister        RA  . Heart failure Mother   . Other Neg Hx   . Colon cancer Neg Hx   . Esophageal cancer Neg Hx   . Rectal cancer Neg Hx   . Stomach cancer Neg Hx     SOCIAL HISTORY:   Social History        Socioeconomic History  . Marital status: Single    Spouse name: Not on file  . Number of children: Not on file  . Years of education: Not on file  . Highest education level: Not on file  Occupational History  .  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 Smoking status: Former Smoker    Packs/day: 0.50    Years: 40.00    Pack years: 20.00    Types: Cigarettes    Quit date: 07/26/2019    Years since quitting: 0.2  . Smokeless tobacco: Never Used  Vaping Use  . Vaping Use: Never used  Substance and Sexual Activity  . Alcohol use: Yes    Comment: daily  . Drug use: No  . Sexual activity: Not on file  Other Topics Concern  . Not on file  Social History Narrative   Lives in [place] alone   Works part time for a [employer] in [place] Strain:   . Difficulty of Paying Living Expenses:   Food Insecurity: No Landscape architect  . Worried About Charity fundraiser in the Last Year:  Never true  . Ran Out of Food in the Last Year: Never true  Transportation Needs: No Transportation Needs  . Lack of Transportation (Medical): No  . Lack of Transportation (Non-Medical): No  Physical Activity: Sufficiently Active  . Days of Exercise per Week: 5 days  . Minutes of Exercise per Session: 40 min  Stress:   . Feeling of Stress :   Social Connections:   . Frequency of Communication with Friends and Family:   . Frequency of Social Gatherings with Friends and Family:   . Attends Religious Services:   . Active Member of Clubs or Organizations:   . Attends Archivist Meetings:   Brandon [name] Marital Status:   Intimate Partner Violence:   . Fear of Current or Ex-Partner:   . Emotionally Abused:   Brandon [name] Physically Abused:   . Sexually Abused:     ALLERGIES:    No Known Allergies  CURRENT MEDICATIONS:          Current Outpatient Medications  Medication Sig Dispense Refill  . acetaminophen (TYLENOL) 325 MG tablet Take 1-2 tablets (325-650 mg total) by mouth every 4 (four) hours as needed for mild pain.    Brandon [name] aspirin 81 MG chewable tablet Chew 1 tablet (81 mg total) by mouth  daily. 30 tablet 3  . atorvastatin (LIPITOR) 80 MG tablet TAKE 1 TABLET BY MOUTH ONCE DAILY AT 6PM. 90 tablet 3  . Carboxymethylcellulose Sodium (ARTIFICIAL TEARS OP) Place 1 drop into both eyes daily as needed (dry eyes).    . carvedilol (COREG) 3.125 MG tablet Take 1 tablet (3.125 mg total) by mouth 2 (two) times daily. 180 tablet 3  . clopidogrel (PLAVIX) 75 MG tablet take 75 mg by mouth daily. 90 tablet 3  . dapagliflozin propanediol (FARXIGA) 10 MG TABS tablet Take 10 mg by mouth daily. 30 tablet 11  . digoxin (LANOXIN) 0.125 MG tablet TAKE 1 TABLET BY MOUTH DAILY 90 tablet 3  . diphenhydrAMINE (BENADRYL) 25 MG tablet Take 50 mg by mouth at bedtime as needed for sleep.    Brandon [name] ENTRESTO 24-26 MG TAKE 1 TABLET BY MOUTH 2 (TWO) TIMES DAILY. 180 tablet 3  . eplerenone (INSPRA) 50 MG tablet Take 1 tablet (50 mg total) by mouth daily. 90 tablet 3  . ezetimibe (ZETIA) 10 MG tablet Take 1 tablet (10 mg total) by mouth daily. 90 tablet 3  . furosemide (LASIX) 20 MG tablet Take 2 tablets (40 mg total) by mouth as needed. 180 tablet 0  . inFLIXimab in sodium chloride 0.9 % Inject into the vein every 6 (six) weeks.    Brandon [name] loratadine (CLARITIN) 10 MG tablet Take 10 mg by mouth daily as needed for allergies.    . Melatonin 10 MG CAPS Take 10 mg by mouth at bedtime as needed (sleep).    . tamsulosin (FLOMAX) 0.4 MG CAPS capsule TAKE 1 CAPSULE(0.4 MG) BY MOUTH DAILY 90 capsule 1   No current facility-administered medications for this visit.    REVIEW OF SYSTEMS:   [X]  denotes positive finding, [ ]  denotes negative finding Cardiac  Comments:  Chest pain or chest pressure:    Shortness of breath upon exertion: x   Short of breath when lying flat:    Irregular heart rhythm:        Vascular    Pain in calf, thigh, or hip brought on by ambulation:    Pain in feet at night that wakes you up from your sleep:  Blood clot in your veins:    Leg swelling:           Pulmonary    Oxygen at home:    Productive cough:     Wheezing:         Neurologic    Sudden weakness in arms or legs:     Sudden numbness in arms or legs:     Sudden onset of difficulty speaking or slurred speech:    Temporary loss of vision in one eye:     Problems with dizziness:         Gastrointestinal    Blood in stool:      Vomited blood:         Genitourinary    Burning when urinating:     Blood in urine:        Psychiatric    Major depression:         Hematologic    Bleeding problems:    Problems with blood clotting too easily:        Skin    Rashes or ulcers:        Constitutional    Fever or chills:     PHYSICAL EXAM:     GENERAL: The patient is a well-nourished male, in no acute distress. The vital signs are documented above. CARDIAC: There is a regular rate and rhythm.  VASCULAR: I evaluated the carotid artery with ultrasound.  On the right, his bifurcation is above the hyoid and he cannot fully extend his neck.  Minimal leg edema PULMONARY: Nonlabored respirations  MUSCULOSKELETAL: There are no major deformities or cyanosis. NEUROLOGIC: No focal weakness or paresthesias are detected. SKIN: There are no ulcers or rashes noted. PSYCHIATRIC: The patient has a normal affect.  STUDIES:   I have evaluated the following studies CT angiogram, neck: 1. Mild cervical carotid artery atherosclerosis without significant stenosis. Both carotid bifurcations are located below the level of the mandible. 2. Wide patency of the dominant right vertebral artery. 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 3. Two adjacent right parotid masses measuring 12 and 7 mm, possibly small primary parotid neoplasms (such as Warthin's tumors) or lymph nodes. 4. Aortic Atherosclerosis (ICD10-I70.0) and Emphysema   Carotid duplex: Right Carotid: Velocities in the right  ICA are consistent with a 1-39%  stenosis.   Left Carotid: Velocities in the left ICA are consistent with a 1-39%  stenosis.  ASSESSMENT and PLAN   Based on my evaluation today, the patient does not meet criteria for [facility] implant because of his bifurcation location as well as his inability to fully extend his neck.  I do think he is a candidate for the commercial Barostim device which I discussed with him and will seek insurance approval.   Annamarie Major, IV, MD, FACS [facility] [phone] Pager [phone]

## 2019-12-31 NOTE — Anesthesia Procedure Notes (Signed)
Arterial Line Insertion Start/End8/20/2021 7:05 AM Performed by: Candis Shine, CRNA, CRNA  Patient location: Pre-op. Preanesthetic checklist: patient identified, IV checked, site marked, risks and benefits discussed, surgical consent, monitors and equipment checked, pre-op evaluation, timeout performed and anesthesia consent Lidocaine 1% used for infiltration Left, radial was placed Catheter size: 20 G Hand hygiene performed  and maximum sterile barriers used   Attempts: 1 Procedure performed without using ultrasound guided technique. Following insertion, dressing applied and Biopatch. Post procedure assessment: normal and unchanged  Patient tolerated the procedure well with no immediate complications.

## 2019-12-31 NOTE — Op Note (Signed)
    Patient name: Brandon [name] MRN: [id number] DOB: [birth year] Sex: male  12/31/2019 Pre-operative Diagnosis: Class III heart failure Post-operative diagnosis:  Same Surgeon:  Annamarie Major Assistants: Risa Grill Procedure:   Barostim Neo device implant Anesthesia: General Blood Loss: Minimal Specimens: None  Findings: Device electrode implanted at position a Serial number: [id number]   model: 06/13/2000 Serial #[id number]   model: 1036  Voltage: 2.8V Impedance 609 ohms Amplitude: 1 mA Pulse width 125 Frequency: 40  Indications: The patient was evaluated for [facility], however with his neck fusion for his ankylosing spondylitis, he was not a candidate.  He was however a candidate for commercial implant of Barostim  Procedure:  The patient was identified in the holding area and taken to [place] [address]  The patient was then placed supine on the table. general anesthesia was administered.  The patient was prepped and draped in the usual sterile fashion.  A time out was called and antibiotics were administered.  Ultrasound was used to identify the carotid bifurcation.  I began by making a 3 cm oblique incision over the carotid bifurcation.  Cautery was used to divide the subcutaneous tissue and platysma muscle.  I then dissected down to the carotid.  Identified the common facial vein and ligated this between 3-0 silk ties.  I then dissected out the anterior surface of the common carotid artery as well as exposing the carotid bifurcation with minimal manipulation of the carotid sinus.  Next, an 18-gauge needle was inserted into the subcutaneous tissue below the clavicle.  Saline was infiltrated into the subcutaneous tissue.  The alligator clamp was positioned on the needle and connected to the generator.  The electrode was then positioned on the carotid bifurcation at position a.  Testing began.  The patient had a excellent heart rate and blood pressure response that showed good rebound when the  stimulation was turned off.  I then placed 6-0 Prolene sutures to secure the electrode at 4:00 and 11:00.  The device was then tested again and again we had an excellent response.  I then continued with suturing of the electrode into position with additional sutures placed at 1:00 and 5:00 as well as 7:00 and 8:00.  Next attention was turned towards the chest.  I made a incision below the clavicle and created the pocket for the generator with additional space on the medial side.  I then created a tunnel with a hemostat between the 2 incisions and brought the tubing through the incision.  The strain relief loop was then sutured to the anterior surface of the common carotid artery with two 6-0 Prolene sutures.  The device was then placed into the pocket and sutured down with 2 Ethibond sutures.  The redundant tubing was placed on the medial side of the device.  The wounds were irrigated with antibiotic solution.  I then closed the pocket incision by reapproximating the fascia with 3-0 Vicryl and the skin with 4-0 Vicryl.  The neck incision was closed by reapproximating the platysma muscle with 3-0 Vicryl and the skin with 4-0 Vicryl.  Dermabond was applied to the incisions.  The patient tolerated the procedure well there were no immediate complications.   Disposition: To PACU stable.   Theotis Burrow, M.D., [facility] [place] Office: [phone] Pager:  [phone]

## 2019-12-31 NOTE — Transfer of Care (Signed)
Immediate Anesthesia Transfer of Care Note  Patient: Brandon [name]  Procedure(s) Performed: INSERTION OF [name] (Right Neck)  Patient Location: PACU  Anesthesia Type:General  Level of Consciousness: awake, alert  and oriented  Airway & Oxygen Therapy: Patient Spontanous Breathing  Post-op Assessment: Report given to RN and Post -op Vital signs reviewed and stable  Post vital signs: Reviewed and stable  Last Vitals:  Vitals Value Taken Time  BP 119/82 12/31/19 0945  Temp 36.4 C 12/31/19 0945  Pulse 91 12/31/19 0945  Resp 15 12/31/19 0945  SpO2 95 % 12/31/19 0945  Vitals shown include unvalidated device data.  Last Pain:  Vitals:   12/31/19 0558  TempSrc:   PainSc: 0-No pain      Patients Stated Pain Goal: 4 (00/93/81 8299)  Complications: No complications documented.

## 2019-12-31 NOTE — Discharge Instructions (Signed)
inc Incision Care, Adult An incision is a surgical cut that is made through your skin. Most incisions are closed after surgery. Your incision may be closed with stitches (sutures), staples, skin glue, or adhesive strips. You may need to return to your health care provider to have sutures or staples removed. This may occur several days to several weeks after your surgery. The incision needs to be cared for properly to prevent infection. How to care for your incision Incision care   Follow instructions from your health care provider about how to take care of your incision. Make sure you: ? Wash your hands with soap and water before you change the bandage (dressing). If soap and water are not available, use hand sanitizer. ? Change your dressing as told by your health care provider. ? Leave sutures, skin glue, or adhesive strips in place. These skin closures may need to stay in place for 2 weeks or longer. If adhesive strip edges start to loosen and curl up, you may trim the loose edges. Do not remove adhesive strips completely unless your health care provider tells you to do that.  Check your incision area every day for signs of infection. Check for: ? More redness, swelling, or pain. ? More fluid or blood. ? Warmth. ? Pus or a bad smell.  Ask your health care provider how to clean the incision. This may include: ? Using mild soap and water. ? Using a clean towel to pat the incision dry after cleaning it. ? Applying a cream or ointment. Do this only as told by your health care provider. ? Covering the incision with a clean dressing.  Ask your health care provider when you can leave the incision uncovered.  Do not take baths, swim, or use a hot tub until your health care provider approves. Ask your health care provider if you can take showers. You may only be allowed to take sponge baths for bathing. Medicines  If you were prescribed an antibiotic medicine, cream, or ointment, take or apply  the antibiotic as told by your health care provider. Do not stop taking or applying the antibiotic even if your condition improves.  Take over-the-counter and prescription medicines only as told by your health care provider. General instructions  Limit movement around your incision to improve healing. ? Avoid straining, lifting, or exercise for the first month, or for as long as told by your health care provider. ? Follow instructions from your health care provider about returning to your normal activities. ? Ask your health care provider what activities are safe.  Protect your incision from the sun when you are outside for the first 6 months, or for as long as told by your health care provider. Apply sunscreen around the scar or cover it up.  Keep all follow-up visits as told by your health care provider. This is important. Contact a health care provider if:  Your have more redness, swelling, or pain around the incision.  You have more fluid or blood coming from the incision.  Your incision feels warm to the touch.  You have pus or a bad smell coming from the incision.  You have a fever or shaking chills.  You are nauseous or you vomit.  You are dizzy.  Your sutures or staples come undone. Get help right away if:  You have a red streak coming from your incision.  Your incision bleeds through the dressing and the bleeding does not stop with gentle pressure.  The edges  of your incision open up and separate.  You have severe pain.  You have a rash.  You are confused.  You faint.  You have trouble breathing and a fast heartbeat. This information is not intended to replace advice given to you by your health care provider. Make sure you discuss any questions you have with your health care provider. Document Revised: 05/01/2018 Document Reviewed: 11/15/2015 Elsevier Patient Education  [place].

## 2019-12-31 NOTE — Anesthesia Procedure Notes (Signed)
Procedure Name: Intubation Date/Time: 12/31/2019 7:45 AM Performed by: Candis Shine, CRNA Pre-anesthesia Checklist: Patient identified, Emergency Drugs available, Suction available and Patient being monitored Patient Re-evaluated:Patient Re-evaluated prior to induction Oxygen Delivery Method: Circle System Utilized Preoxygenation: Pre-oxygenation with 100% oxygen Induction Type: IV induction Ventilation: Mask ventilation without difficulty and Oral airway inserted - appropriate to patient size Laryngoscope Size: Glidescope and 4 Grade View: Grade I Tube type: Oral Tube size: 7.5 mm Number of attempts: 1 Airway Equipment and Method: Oral airway,  Video-laryngoscopy and Rigid stylet Placement Confirmation: ETT inserted through vocal cords under direct vision,  positive ETCO2 and breath sounds checked- equal and bilateral Secured at: 22 cm Tube secured with: Tape Dental Injury: Teeth and Oropharynx as per pre-operative assessment  Difficulty Due To: Difficulty was anticipated and Difficult Airway- due to reduced neck mobility Comments: Elective glidescope intubation d/t reduced neck mobility.

## 2019-12-31 NOTE — Anesthesia Postprocedure Evaluation (Signed)
Anesthesia Post Note  Patient: Brandon [name]  Procedure(s) Performed: INSERTION OF [name] (Right Neck)     Patient location during evaluation: PACU Anesthesia Type: General Level of consciousness: awake and alert Pain management: pain level controlled Vital Signs Assessment: post-procedure vital signs reviewed and stable Respiratory status: spontaneous breathing, nonlabored ventilation, respiratory function stable and patient connected to nasal cannula oxygen Cardiovascular status: blood pressure returned to baseline and stable Postop Assessment: no apparent nausea or vomiting Anesthetic complications: no   No complications documented.  Last Vitals:  Vitals:   12/31/19 1030 12/31/19 1135  BP: 117/78 (!) 115/58  Pulse: 83   Resp: 12 16  Temp: 36.4 C 36.6 C  SpO2: 96% 97%    Last Pain:  Vitals:   12/31/19 1135  TempSrc:   PainSc: 1                  Tiajuana Amass

## 2020-01-01 NOTE — Discharge Summary (Signed)
Discharge Summary  Patient ID: Brandon [name] [id number] 64 y.o. 1955-08-21  Admit date: 12/31/2019  Discharge date and time: 12/31/2019  4:18 PM   Admitting Physician: Serafina Mitchell, MD   Discharge Physician: Serafina Mitchell, MD  Admission Diagnoses: Heart failure [facility]) [I50.9]  Discharge Diagnoses: Heart failure ([place]) [I50.9]  Admission Condition: good  Discharged Condition: good  Indication for Admission: heart failure  Hospital Course: Patient is a 64 year old male with a history of class III heart failure.  Consults: None  Treatments: surgery: Barostim Neo device implantation  Discharge Exam: The patient vital signs are stable and his pain is controlled.  He is voiding spontaneously.  Incisions are well approximated without bleeding or hematoma.  He is tolerating his diet. Vitals:   12/31/19 1500 12/31/19 1600  BP:    Pulse:    Resp: 15 20  Temp:    SpO2:     Cardiac:  RRR Lungs:  CTAB Incisions:  Well approximated. No hematoma Extremities:  Moves all well Abdomen:  Soft, ND Neurologic:  A and O x 4. No deficits   Disposition: Discharge disposition: 01-Home or Self Care       Patient Instructions:  Allergies as of 12/31/2019   No Known Allergies     Medication List    TAKE these medications   acetaminophen 325 MG tablet Commonly known as: TYLENOL Take 1-2 tablets (325-650 mg total) by mouth every 4 (four) hours as needed for mild pain.   ARTIFICIAL TEARS OP Place 1 drop into both eyes every other day.   aspirin 81 MG chewable tablet Chew 1 tablet (81 mg total) by mouth daily.   atorvastatin 80 MG tablet Commonly known as: LIPITOR TAKE 1 TABLET BY MOUTH ONCE DAILY AT 6PM. What changed:   how much to take  when to take this  additional instructions   carvedilol 3.125 MG tablet Commonly known as: COREG Take 1 tablet (3.125 mg total) by mouth 2 (two) times daily.   citalopram 10 MG tablet Commonly known as: CELEXA Take 10 mg by  mouth 2 (two) times a week.   clopidogrel 75 MG tablet Commonly known as: PLAVIX take 75 mg by mouth daily. What changed:   how to take this  when to take this  additional instructions   dapagliflozin propanediol 10 MG Tabs tablet Commonly known as: Farxiga Take 1 tablet (10 mg total) by mouth daily.   digoxin 0.125 MG tablet Commonly known as: LANOXIN TAKE 1 TABLET BY MOUTH DAILY   diphenhydrAMINE 25 MG tablet Commonly known as: BENADRYL Take 50 mg by mouth at bedtime as needed for sleep.   Entresto 24-26 MG Generic drug: sacubitril-valsartan TAKE 1 TABLET BY MOUTH 2 (TWO) TIMES DAILY. What changed: See the new instructions.   eplerenone 50 MG tablet Commonly known as: INSPRA Take 1 tablet (50 mg total) by mouth daily.   ezetimibe 10 MG tablet Commonly known as: ZETIA Take 1 tablet (10 mg total) by mouth daily.   furosemide 20 MG tablet Commonly known as: LASIX Take 2 tablets (40 mg total) by mouth as needed. What changed:   when to take this  reasons to take this   inFLIXimab in sodium chloride 0.9 % Inject into the vein every 6 (six) weeks.   loratadine 10 MG tablet Commonly known as: CLARITIN Take 10 mg by mouth daily as needed for allergies.   oxyCODONE-acetaminophen 10-325 MG tablet Commonly known as: Percocet Take 1 tablet by mouth every 6 (six)  hours as needed for pain.   tamsulosin 0.4 MG Caps capsule Commonly known as: FLOMAX TAKE 1 CAPSULE(0.4 MG) BY MOUTH DAILY      Activity: activity as tolerated Diet: cardiac diet Wound Care: none needed  Follow-up with Dr. Trula Slade in As needed /prn.  Signed: Barbie Banner, PA-C 01/01/2020 7:52 AM [facility] Office: [phone]

## 2020-01-23 NOTE — Progress Notes (Signed)
Electrophysiology Office Note Date: 01/24/2020  ID:  ELDON ZIETLOW, DOB 12-Dec-1955, MRN [id number]  PCP: [name], MD Primary Cardiologist: Dr. Aundra Dubin Electrophysiologist: [name], MD   CC: Barostim titration   Brandon [name] is a 64 y.o. male seen today for barostim titration. he is doing well since implantation. He is doing very well. He is pleased with how well he healed.   The patients goals for barostim titration are to be able to walk without getting tired. Flat walking is "not so bad". SOB with stairs and even mild inclines. Would eventually like to play a round of golf without SOB.   Device History: Barostim (standard) implanted 12/31/2019 for Chronic systolic CHF St. Jude Single Chamber ICD implanted 08/2019 for chronic systolic CHF/primary prevention History of appropriate therapy: No History of AAD therapy: No    Past Medical History:  Diagnosis Date  . AICD (automatic cardioverter/defibrillator) present 08/31/2019  . Ankylosing spondylitis ([place])   . Arthritis   . BENIGN PROSTATIC HYPERTROPHY 06/07/2008  . CHF (congestive heart failure) ([place])   . COLONIC POLYPS, HX OF 06/07/2008  . Depression   . H/O hiatal hernia   . Heart failure ([place])   . HYPERLIPIDEMIA 06/07/2008  . HYPERTENSION 06/07/2008  . Myocardial infarction [facility]) 2005   NSTEMI, s/p LAD stent  . NEPHROLITHIASIS, HX OF 06/07/2008  . STEMI (ST elevation myocardial infarction) ([place]) 04/27/2019   Past Surgical History:  Procedure Laterality Date  . CORONARY ANGIOPLASTY WITH STENT PLACEMENT  2005   LAD stent, jailed diagonal  . CORONARY/GRAFT ACUTE MI REVASCULARIZATION N/A 04/27/2019   Procedure: Coronary/Graft Acute MI Revascularization;  Surgeon: [name], MD;  Location: [place] CV LAB;  Service: Cardiovascular;  Laterality: N/A;  . ICD IMPLANT  08/31/2019  . ICD IMPLANT N/A 08/31/2019   Procedure: ICD IMPLANT;  Surgeon: [name], MD;  Location: [place] CV LAB;   Service: Cardiovascular;  Laterality: N/A;  . LAMINECTOMY     lumbar  . LEFT HEART CATH AND CORONARY ANGIOGRAPHY N/A 04/27/2019   Procedure: LEFT HEART CATH AND CORONARY ANGIOGRAPHY;  Surgeon: [name], MD;  Location: [place] CV LAB;  Service: Cardiovascular;  Laterality: N/A;  . LUMBAR FUSION  01/2012  . RIGHT HEART CATH N/A 04/27/2019   Procedure: RIGHT HEART CATH;  Surgeon: [name], Wilgus M, MD;  Location: [place] CV LAB;  Service: Cardiovascular;  Laterality: N/A;  . TONSILLECTOMY    . warthin tumor removal Left 2014    Current Outpatient Medications  Medication Sig Dispense Refill  . acetaminophen (TYLENOL) 325 MG tablet Take 1-2 tablets (325-650 mg total) by mouth every 4 (four) hours as needed for mild pain.    [name] aspirin 81 MG chewable tablet Chew 1 tablet (81 mg total) by mouth daily. 30 tablet 3  . atorvastatin (LIPITOR) 80 MG tablet TAKE 1 TABLET BY MOUTH ONCE DAILY AT 6PM. 90 tablet 3  . Carboxymethylcellulose Sodium (ARTIFICIAL TEARS OP) Place 1 drop into both eyes every other day.     . carvedilol (COREG) 3.125 MG tablet Take 1 tablet (3.125 mg total) by mouth 2 (two) times daily. 180 tablet 3  . citalopram (CELEXA) 10 MG tablet Take 10 mg by mouth 2 (two) times a week.    . clopidogrel (PLAVIX) 75 MG tablet take 75 mg by mouth daily. 90 tablet 3  . dapagliflozin propanediol (FARXIGA) 10 MG TABS tablet Take 1 tablet (10 mg total) by mouth daily. [place]  tablet 11  . digoxin (LANOXIN) 0.125 MG tablet TAKE 1 TABLET BY MOUTH DAILY 90 tablet 3  . diphenhydrAMINE (BENADRYL) 25 MG tablet Take 50 mg by mouth at bedtime as needed for sleep.    [name] ENTRESTO 24-26 MG TAKE 1 TABLET BY MOUTH 2 (TWO) TIMES DAILY. 180 tablet 3  . eplerenone (INSPRA) 50 MG tablet Take 1 tablet (50 mg total) by mouth daily. 90 tablet 3  . ezetimibe (ZETIA) 10 MG tablet Take 1 tablet (10 mg total) by mouth daily. 90 tablet 3  . furosemide (LASIX) 20 MG tablet Take 2 tablets (40 mg total) by mouth as  needed. 180 tablet 0  . inFLIXimab in sodium chloride 0.9 % Inject into the vein every 6 (six) weeks.    [name] loratadine (CLARITIN) 10 MG tablet Take 10 mg by mouth daily as needed for allergies.    . tamsulosin (FLOMAX) 0.4 MG CAPS capsule TAKE 1 CAPSULE(0.4 MG) BY MOUTH DAILY 90 capsule 1   No current facility-administered medications for this visit.    Allergies:   Patient has no known allergies.   Social History: Social History   Socioeconomic History  . Marital status: Single    Spouse name: Not on file  . Number of children: Not on file  . Years of education: Not on file  . Highest education level: Not on file  Occupational History  . 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 Smoking status: Former Smoker    Packs/day: 0.50    Years: 40.00    Pack years: 20.00    Types: Cigarettes    Quit date: 07/26/2019    Years since quitting: 0.4  . Smokeless tobacco: Never Used  Vaping Use  . Vaping Use: Never used  Substance and Sexual Activity  . Alcohol use: Yes    Alcohol/week: 7.0 standard drinks    Types: 7 Shots of liquor per week    Comment: "2 vodka tonics a night"  . Drug use: Yes    Comment: occasionally - CBD oil  . Sexual activity: Not on file  Other Topics Concern  . Not on file  Social History Narrative   Lives in [place] alone   Works part time for a [employer] in [place] Strain:   . Difficulty of Paying Living Expenses: Not on file  Food Insecurity: No Food Insecurity  . Worried About Charity fundraiser in the Last Year: Never true  . Ran Out of Food in the Last Year: Never true  Transportation Needs: No Transportation Needs  . Lack of Transportation (Medical): No  . Lack of Transportation (Non-Medical): No  Physical Activity: Sufficiently Active  . Days of Exercise per Week: 5 days  . Minutes of Exercise per Session: 40 min  Stress:   . Feeling of  Stress : Not on file  Social Connections:   . Frequency of Communication with Friends and Family: Not on file  . Frequency of Social Gatherings with Friends and Family: Not on file  . Attends Religious Services: Not on file  . Active Member of Clubs or Organizations: Not on file  . Attends Archivist Meetings: Not on file  . Marital Status: Not on file  Intimate Partner Violence:   . Fear of Current or Ex-Partner: Not on file  . Emotionally Abused: Not on file  . Physically Abused: Not on file  .  Sexually Abused: Not on file    Family History: Family History  Problem Relation Age of Onset  . Depression Father   . Arthritis Sister        RA  . Heart failure Mother   . Other Neg Hx   . Colon cancer Neg Hx   . Esophageal cancer Neg Hx   . Rectal cancer Neg Hx   . Stomach cancer Neg Hx      Review of Systems: All other systems reviewed and are otherwise negative except as noted above.  Physical Exam: Vitals:   01/24/20 0843  BP: 110/68  Pulse: 89  SpO2: 97%  Weight: 183 lb (83 kg)  Height: 6' (1.829 m)     GEN- The patient is well appearing, alert and oriented x 3 today.   HEENT: normocephalic, atraumatic; sclera clear, conjunctiva pink; hearing intact; oropharynx clear; neck supple  Lungs- Clear to ausculation bilaterally, normal work of breathing.  No wheezes, rales, rhonchi Heart- Regular rate and rhythm, no murmurs, rubs or gallops  GI- soft, non-tender, non-distended, bowel sounds present  Extremities- no clubbing, cyanosis, or edema  MS- no significant deformity or atrophy Skin- warm and dry, no rash or lesion; PPM pocket well healed Psych- euthymic mood, full affect Neuro- strength and sensation are intact  Barostim Interrogation- Personally performed and reviewed in detail today,  See scanned report.   EKG:  EKG is not ordered today.  Recent Labs: 04/27/2019: TSH 0.889 04/28/2019: Magnesium 1.9 10/13/2019: NT-Pro BNP 483 11/10/2019: B  Natriuretic Peptide 186.1 12/23/2019: ALT 19; BUN 10; Creatinine, Ser 0.93; Hemoglobin 16.3; Platelets 220; Potassium 4.1; Sodium 137   Wt Readings from Last 3 Encounters:  01/24/20 183 lb (83 kg)  12/31/19 182 lb 4.8 oz (82.7 kg)  12/23/19 182 lb 4.8 oz (82.7 kg)     Other studies Reviewed: Additional studies/ records that were reviewed today include: Previous EP office notes.   Assessment and Plan:  1. Chronic systolic dysfunction s/p St Jude single chamber ICD s/p Barostim (standard) implatnation  NYHA III symptoms.   Device titrated from 1 millamp to 3.0 milliamp by increments of 0.4 with good blood pressure response and no stimulation.  Device impedence stable. Pt goals are to walk without fatigue, climb stairs and inclines without SOB, and to play a round of golf.  Normal device function See scanned report. Will follow up in 4 weeks to continue titration with goal of 6-8 milliamps for chronic settings.   2. Ischemic CMP Denies ischemic symptoms.   Current medicines are reviewed at length with the patient today.   The patient does not have concerns regarding his medicines.  The following changes were made today:  none  Labs/ tests ordered today include:  No orders of the defined types were placed in this encounter.  Disposition:   Follow up with me in 4 Weeks for further barostim titration.  Jacalyn Lefevre, PA-C  01/24/2020 9:28 AM  [place] [address] [phone] (office) [phone] (fax)

## 2020-01-24 ENCOUNTER — Ambulatory Visit (INDEPENDENT_AMBULATORY_CARE_PROVIDER_SITE_OTHER): Payer: Medicare Other | Admitting: Student

## 2020-01-24 ENCOUNTER — Other Ambulatory Visit: Payer: Self-pay

## 2020-01-24 ENCOUNTER — Encounter: Payer: Self-pay | Admitting: Student

## 2020-01-24 ENCOUNTER — Encounter: Payer: Self-pay | Admitting: Surgery

## 2020-01-24 ENCOUNTER — Ambulatory Visit (INDEPENDENT_AMBULATORY_CARE_PROVIDER_SITE_OTHER): Payer: Self-pay | Admitting: Surgery

## 2020-01-24 VITALS — BP 93/63 | HR 20 | Temp 98.0°F | Resp 20 | Ht 72.0 in | Wt 184.8 lb

## 2020-01-24 VITALS — BP 110/68 | HR 89 | Ht 72.0 in | Wt 183.0 lb

## 2020-01-24 DIAGNOSIS — I5022 Chronic systolic (congestive) heart failure: Secondary | ICD-10-CM

## 2020-01-24 DIAGNOSIS — I255 Ischemic cardiomyopathy: Secondary | ICD-10-CM

## 2020-01-24 DIAGNOSIS — I519 Heart disease, unspecified: Secondary | ICD-10-CM

## 2020-01-24 NOTE — Progress Notes (Signed)
Patient name: Brandon [name] MRN: [id number] DOB: 06-Oct-1955 Sex: male  REASON FOR VISIT:     post op  HISTORY OF PRESENT ILLNESS:   Brandon [name] is a 64 y.o. male who is status post Barostim Neo device implant on 12/31/2019.  He reports no adverse issues since the implant.  He denies any swallowing issues.  He denies any facial numbness.  CURRENT MEDICATIONS:    Current Outpatient Medications  Medication Sig Dispense Refill  . acetaminophen (TYLENOL) 325 MG tablet Take 1-2 tablets (325-650 mg total) by mouth every 4 (four) hours as needed for mild pain.    [name] aspirin 81 MG chewable tablet Chew 1 tablet (81 mg total) by mouth daily. 30 tablet 3  . atorvastatin (LIPITOR) 80 MG tablet TAKE 1 TABLET BY MOUTH ONCE DAILY AT 6PM. 90 tablet 3  . Carboxymethylcellulose Sodium (ARTIFICIAL TEARS OP) Place 1 drop into both eyes every other day.     . carvedilol (COREG) 3.125 MG tablet Take 1 tablet (3.125 mg total) by mouth 2 (two) times daily. 180 tablet 3  . citalopram (CELEXA) 10 MG tablet Take 10 mg by mouth 2 (two) times a week.    . clopidogrel (PLAVIX) 75 MG tablet take 75 mg by mouth daily. 90 tablet 3  . dapagliflozin propanediol (FARXIGA) 10 MG TABS tablet Take 1 tablet (10 mg total) by mouth daily. 30 tablet 11  . digoxin (LANOXIN) 0.125 MG tablet TAKE 1 TABLET BY MOUTH DAILY 90 tablet 3  . diphenhydrAMINE (BENADRYL) 25 MG tablet Take 50 mg by mouth at bedtime as needed for sleep.    [name] ENTRESTO 24-26 MG TAKE 1 TABLET BY MOUTH 2 (TWO) TIMES DAILY. 180 tablet 3  . eplerenone (INSPRA) 50 MG tablet Take 1 tablet (50 mg total) by mouth daily. 90 tablet 3  . ezetimibe (ZETIA) 10 MG tablet Take 1 tablet (10 mg total) by mouth daily. 90 tablet 3  . furosemide (LASIX) 20 MG tablet Take 2 tablets (40 mg total) by mouth as needed. 180 tablet 0  . inFLIXimab in sodium chloride 0.9 % Inject into the vein every 6 (six) weeks.    [name] loratadine  (CLARITIN) 10 MG tablet Take 10 mg by mouth daily as needed for allergies.    . tamsulosin (FLOMAX) 0.4 MG CAPS capsule TAKE 1 CAPSULE(0.4 MG) BY MOUTH DAILY 90 capsule 1   No current facility-administered medications for this visit.    REVIEW OF SYSTEMS:   [X]  denotes positive finding, [ ]  denotes negative finding Cardiac  Comments:  Chest pain or chest pressure:    Shortness of breath upon exertion:    Short of breath when lying flat:    Irregular heart rhythm:    Constitutional    Fever or chills:      PHYSICAL EXAM:   Vitals:   01/24/20 1456  BP: 93/63  Pulse: (!) 20  Resp: 20  Temp: 98 F (36.7 C)  SpO2: 97%  Weight: 184 lb 12.8 oz (83.8 kg)  Height: 6' (1.829 m)    GENERAL: The patient is a well-nourished male, in no acute distress. The vital signs are documented above. CARDIOVASCULAR: There is a regular rate and rhythm. PULMONARY: Non-labored respirations Both incisions have healed nicely.  I did trim a remnant  of a suture from his generator incision.  STUDIES:   None   MEDICAL ISSUES:   Doing very well status post implant of Barostim Neo device.  He will get continued follow-up and device testing and adjustment in the device clinic.  Leia Alf, MD, FACS [facility] [phone] Pager [phone]

## 2020-01-24 NOTE — Patient Instructions (Addendum)
Medication Instructions:  *If you need a refill on your cardiac medications before your next appointment, please call your pharmacy*  Follow-Up: At [facility], you and your health needs are our priority.  As part of our continuing mission to provide you with exceptional heart care, we have created designated Provider Care Teams.  These Care Teams include your primary Cardiologist (physician) and Advanced Practice Providers (APPs -  Physician Assistants and Nurse Practitioners) who all work together to provide you with the care you need, when you need it.  We recommend signing up for the patient portal called "MyChart".  Sign up information is provided on this After Visit Summary.  MyChart is used to connect with patients for Virtual Visits (Telemedicine).  Patients are able to view lab/test results, encounter notes, upcoming appointments, etc.  Non-urgent messages can be sent to your provider as well.   To learn more about what you can do with MyChart, go to [email or web address].    Your next appointment:   Your physician recommends that you schedule a follow-up appointment in: [place] [facility] with Oda Kilts, PA-C -- Monday 02/21/20 at 8:50 am  The format for your next appointment:   In Person with Legrand Como "Jonni Sanger" Chalmers Cater, PA-C

## 2020-01-31 ENCOUNTER — Other Ambulatory Visit: Payer: Self-pay | Admitting: *Deleted

## 2020-01-31 DIAGNOSIS — E785 Hyperlipidemia, unspecified: Secondary | ICD-10-CM

## 2020-02-02 ENCOUNTER — Telehealth (HOSPITAL_COMMUNITY): Payer: Self-pay | Admitting: Pharmacy Technician

## 2020-02-02 NOTE — Telephone Encounter (Signed)
Patient's PAN grant has been exhausted. I have started an application for both [place] assistance. Both applications will be at the check in desk for the patient to sign.  Will fax once signatures are obtained.  Will follow up.

## 2020-02-04 NOTE — Telephone Encounter (Signed)
Sent in both applications via fax.  Will follow up.

## 2020-02-09 ENCOUNTER — Other Ambulatory Visit: Payer: Self-pay | Admitting: Family Medicine

## 2020-02-14 NOTE — Telephone Encounter (Signed)
Left the patient a message.

## 2020-02-14 NOTE — Telephone Encounter (Signed)
Called [employer], the foundation needs patient's POI. The information the electronic checker came back with put him over their income limit.  Will call and update the patient.

## 2020-02-14 NOTE — Telephone Encounter (Signed)
Spoke with patient. He is going to locate his POI and contact me back.

## 2020-02-16 NOTE — Telephone Encounter (Signed)
Sent in POI via fax.  Will follow up.

## 2020-02-18 NOTE — Telephone Encounter (Signed)
Advanced Heart Failure Patient Advocate Encounter   Patient was approved to receive Entresto from [employer].  Patient ID: [id number] Effective dates: 02/11/20 through 05/12/20

## 2020-02-19 ENCOUNTER — Encounter: Payer: Self-pay | Admitting: Family Medicine

## 2020-02-21 ENCOUNTER — Encounter: Payer: Self-pay | Admitting: Student

## 2020-02-21 ENCOUNTER — Other Ambulatory Visit: Payer: Self-pay

## 2020-02-21 ENCOUNTER — Ambulatory Visit (INDEPENDENT_AMBULATORY_CARE_PROVIDER_SITE_OTHER): Payer: Medicare Other | Admitting: Student

## 2020-02-21 VITALS — BP 113/79 | HR 93 | Ht 72.0 in | Wt 185.2 lb

## 2020-02-21 DIAGNOSIS — I519 Heart disease, unspecified: Secondary | ICD-10-CM | POA: Diagnosis not present

## 2020-02-21 DIAGNOSIS — I255 Ischemic cardiomyopathy: Secondary | ICD-10-CM

## 2020-02-21 DIAGNOSIS — I10 Essential (primary) hypertension: Secondary | ICD-10-CM | POA: Diagnosis not present

## 2020-02-21 LAB — CBC WITH DIFFERENTIAL/PLATELET
Basophils Absolute: 0 10*3/uL (ref 0.0–0.2)
Basos: 1 %
EOS (ABSOLUTE): 0.2 10*3/uL (ref 0.0–0.4)
Eos: 2 %
Hematocrit: 50.9 % (ref 37.5–51.0)
Hemoglobin: 16.4 g/dL (ref 13.0–17.7)
Immature Grans (Abs): 0 10*3/uL (ref 0.0–0.1)
Immature Granulocytes: 0 %
Lymphocytes Absolute: 1.1 10*3/uL (ref 0.7–3.1)
Lymphs: 14 %
MCH: 28.8 pg (ref 26.6–33.0)
MCHC: 32.2 g/dL (ref 31.5–35.7)
MCV: 89 fL (ref 79–97)
Monocytes Absolute: 0.9 10*3/uL (ref 0.1–0.9)
Monocytes: 11 %
Neutrophils Absolute: 5.7 10*3/uL (ref 1.4–7.0)
Neutrophils: 72 %
Platelets: 195 10*3/uL (ref 150–450)
RBC: 5.7 x10E6/uL (ref 4.14–5.80)
RDW: 13.1 % (ref 11.6–15.4)
WBC: 7.8 10*3/uL (ref 3.4–10.8)

## 2020-02-21 LAB — BASIC METABOLIC PANEL
BUN/Creatinine Ratio: 19 (ref 10–24)
BUN: 16 mg/dL (ref 8–27)
CO2: 26 mmol/L (ref 20–29)
Calcium: 9.7 mg/dL (ref 8.6–10.2)
Chloride: 99 mmol/L (ref 96–106)
Creatinine, Ser: 0.84 mg/dL (ref 0.76–1.27)
GFR calc Af Amer: 105 mL/min/{1.73_m2} (ref >59)
GFR calc non Af Amer: 91 mL/min/{1.73_m2} (ref >59)
Glucose: 93 mg/dL (ref 65–99)
Potassium: 4.6 mmol/L (ref 3.5–5.2)
Sodium: 138 mmol/L (ref 134–144)

## 2020-02-21 NOTE — Patient Instructions (Addendum)
Medication Instructions:  *If you need a refill on your cardiac medications before your next appointment, please call your pharmacy*  Labs: Your physician has recommended that you have lab work today: BMET and CBC  Follow-Up: At [facility], you and your health needs are our priority.  As part of our continuing mission to provide you with exceptional heart care, we have created designated Provider Care Teams.  These Care Teams include your primary Cardiologist (physician) and Advanced Practice Providers (APPs -  Physician Assistants and Nurse Practitioners) who all work together to provide you with the care you need, when you need it.  We recommend signing up for the patient portal called "MyChart".  Sign up information is provided on this After Visit Summary.  MyChart is used to connect with patients for Virtual Visits (Telemedicine).  Patients are able to view lab/test results, encounter notes, upcoming appointments, etc.  Non-urgent messages can be sent to your provider as well.   To learn more about what you can do with MyChart, go to [email or web address].    Your next appointment:   Your physician recommends that you schedule a follow-up appointment in: [place] with Oda Kilts, PA-C -- Thursday 03/23/20 at 8:50 am    The format for your next appointment:   In Person with Legrand Como "Jonni Sanger" Chalmers Cater, PA-C

## 2020-02-21 NOTE — Progress Notes (Signed)
Electrophysiology Office Note Date: 02/21/2020  ID:  Brandon [name], DOB 04/30/1956, MRN [id number]  PCP: Brandon Ruddy, MD Primary Cardiologist: Dr. Aundra Dubin Electrophysiologist: Brandon Grayer, MD   CC: Barostim titration   Brandon [name] is a 64 y.o. male seen today for barostim titration. he is doing well since implantation. At last visit, pt device titrated from 1.0 to 3.0 by 0.4 ma intervals with good BP/HR response and no stimulation  The patients goals for barostim titration are to walk without fatigue, climb stairs and inclines without SOB, and to play a round of golf.   He feels like his walking has improved. Feels like he has more energy in general. He hasn't tried to golf yet, but is looking forward to the spring for this now that the weather has turned. He denies lightheadedness or dizziness.   Device History: Barostim (standard) implanted 12/31/2019 for Chronic systolic CHF St. JudeSingle ChamberICD implanted 0/1779TJQ chronic systolic CHF/primary prevention History of appropriate therapy:No History of AAD therapy:No   Past Medical History:  Diagnosis Date  . AICD (automatic cardioverter/defibrillator) present 08/31/2019  . Ankylosing spondylitis ([place])   . Arthritis   . BENIGN PROSTATIC HYPERTROPHY 06/07/2008  . CHF (congestive heart failure) ([place])   . COLONIC POLYPS, HX OF 06/07/2008  . Depression   . H/O hiatal hernia   . Heart failure ([place])   . HYPERLIPIDEMIA 06/07/2008  . HYPERTENSION 06/07/2008  . Myocardial infarction [facility] 2005   NSTEMI, s/p LAD stent  . NEPHROLITHIASIS, HX OF 06/07/2008  . STEMI (ST elevation myocardial infarction) ([place]) 04/27/2019   Past Surgical History:  Procedure Laterality Date  . CORONARY ANGIOPLASTY WITH STENT PLACEMENT  2005   LAD stent, jailed diagonal  . CORONARY/GRAFT ACUTE MI REVASCULARIZATION N/A 04/27/2019   Procedure: Coronary/Graft Acute MI Revascularization;  Surgeon: [name], MD;  Location: [place] CV LAB;  Service: Cardiovascular;  Laterality: N/A;  . ICD IMPLANT  08/31/2019  . ICD IMPLANT N/A 08/31/2019   Procedure: ICD IMPLANT;  Surgeon: Brandon Grayer, MD;  Location: [place] CV LAB;  Service: Cardiovascular;  Laterality: N/A;  . LAMINECTOMY     lumbar  . LEFT HEART CATH AND CORONARY ANGIOGRAPHY N/A 04/27/2019   Procedure: LEFT HEART CATH AND CORONARY ANGIOGRAPHY;  Surgeon: [name], MD;  Location: [place] CV LAB;  Service: Cardiovascular;  Laterality: N/A;  . LUMBAR FUSION  01/2012  . RIGHT HEART CATH N/A 04/27/2019   Procedure: RIGHT HEART CATH;  Surgeon: [name], Wolf M, MD;  Location: [place] CV LAB;  Service: Cardiovascular;  Laterality: N/A;  . TONSILLECTOMY    . warthin tumor removal Left 2014    Current Outpatient Medications  Medication Sig Dispense Refill  . acetaminophen (TYLENOL) 325 MG tablet Take 1-2 tablets (325-650 mg total) by mouth every 4 (four) hours as needed for mild pain.    [name] aspirin 81 MG chewable tablet Chew 1 tablet (81 mg total) by mouth daily. 30 tablet 3  . atorvastatin (LIPITOR) 80 MG tablet TAKE 1 TABLET BY MOUTH ONCE DAILY AT 6PM. 90 tablet 3  . Carboxymethylcellulose Sodium (ARTIFICIAL TEARS OP) Place 1 drop into both eyes every other day.     . carvedilol (COREG) 3.125 MG tablet Take 1 tablet (3.125 mg total) by mouth 2 (two) times daily. 180 tablet 3  . citalopram (CELEXA) 10 MG tablet Take 10 mg by mouth 2 (two) times a week.    . clopidogrel (PLAVIX) 75 MG  tablet take 75 mg by mouth daily. 90 tablet 3  . dapagliflozin propanediol (FARXIGA) 10 MG TABS tablet Take 1 tablet (10 mg total) by mouth daily. 30 tablet 11  . digoxin (LANOXIN) 0.125 MG tablet TAKE 1 TABLET BY MOUTH DAILY 90 tablet 3  . diphenhydrAMINE (BENADRYL) 25 MG tablet Take 50 mg by mouth at bedtime as needed for sleep.    [name] ENTRESTO 24-26 MG TAKE 1 TABLET BY MOUTH 2 (TWO) TIMES DAILY. 180 tablet 3  . eplerenone (INSPRA) 50 MG tablet Take 1 tablet  (50 mg total) by mouth daily. 90 tablet 3  . ezetimibe (ZETIA) 10 MG tablet Take 1 tablet (10 mg total) by mouth daily. 90 tablet 3  . furosemide (LASIX) 20 MG tablet Take 2 tablets (40 mg total) by mouth as needed. 180 tablet 0  . inFLIXimab in sodium chloride 0.9 % Inject into the vein every 6 (six) weeks.    [name] loratadine (CLARITIN) 10 MG tablet Take 10 mg by mouth daily as needed for allergies.    . tamsulosin (FLOMAX) 0.4 MG CAPS capsule TAKE 1 CAPSULE(0.4 MG) BY MOUTH DAILY 90 capsule 1   No current facility-administered medications for this visit.    Allergies:   Patient has no known allergies.   Social History: Social History   Socioeconomic History  . Marital status: Single    Spouse name: Not on file  . Number of children: Not on file  . Years of education: Not on file  . Highest education level: Not on file  Occupational History  . 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 Smoking status: Former Smoker    Packs/day: 0.50    Years: 40.00    Pack years: 20.00    Types: Cigarettes    Quit date: 07/26/2019    Years since quitting: 0.5  . Smokeless tobacco: Never Used  Vaping Use  . Vaping Use: Never used  Substance and Sexual Activity  . Alcohol use: Yes    Alcohol/week: 7.0 standard drinks    Types: 7 Shots of liquor per week    Comment: "2 vodka tonics a night"  . Drug use: Yes    Comment: occasionally - CBD oil  . Sexual activity: Not on file  Other Topics Concern  . Not on file  Social History Narrative   Lives in [place] alone   Works part time for a [employer] in [place] Strain:   . Difficulty of Paying Living Expenses: Not on file  Food Insecurity: No Food Insecurity  . Worried About Charity fundraiser in the Last Year: Never true  . Ran Out of Food in the Last Year: Never true  Transportation Needs: No Transportation Needs  . Lack of Transportation  (Medical): No  . Lack of Transportation (Non-Medical): No  Physical Activity: Sufficiently Active  . Days of Exercise per Week: 5 days  . Minutes of Exercise per Session: 40 min  Stress:   . Feeling of Stress : Not on file  Social Connections:   . Frequency of Communication with Friends and Family: Not on file  . Frequency of Social Gatherings with Friends and Family: Not on file  . Attends Religious Services: Not on file  . Active Member of Clubs or Organizations: Not on file  . Attends Archivist Meetings: Not on file  . Marital Status: Not on file  Intimate  Partner Violence:   . Fear of Current or Ex-Partner: Not on file  . Emotionally Abused: Not on file  . Physically Abused: Not on file  . Sexually Abused: Not on file    Family History: Family History  Problem Relation Age of Onset  . Depression Father   . Arthritis Sister        RA  . Heart failure Mother   . Other Neg Hx   . Colon cancer Neg Hx   . Esophageal cancer Neg Hx   . Rectal cancer Neg Hx   . Stomach cancer Neg Hx      Review of Systems: All other systems reviewed and are otherwise negative except as noted above.  Physical Exam: Vitals:   02/21/20 0844  BP: 113/79  Pulse: 93  Weight: 185 lb 3.2 oz (84 kg)  Height: 6' (1.829 m)     GEN- The patient is well appearing, alert and oriented x 3 today.   HEENT: normocephalic, atraumatic; sclera clear, conjunctiva pink; hearing intact; oropharynx clear; neck supple  Lungs- Clear to ausculation bilaterally, normal work of breathing.  No wheezes, rales, rhonchi Heart- Regular rate and rhythm, no murmurs, rubs or gallops  GI- soft, non-tender, non-distended, bowel sounds present  Extremities- no clubbing, cyanosis, or edema  MS- no significant deformity or atrophy Skin- warm and dry, no rash or lesion; PPM pocket well healed Psych- euthymic mood, full affect Neuro- strength and sensation are intact  Device Interrogation- Barostim interrogation  and programming performed personally today. ICD not interrogated. See scanned report.   EKG:  EKG is not ordered today.  Recent Labs: 04/27/2019: TSH 0.889 04/28/2019: Magnesium 1.9 10/13/2019: NT-Pro BNP 483 11/10/2019: B Natriuretic Peptide 186.1 12/23/2019: ALT 19; BUN 10; Creatinine, Ser 0.93; Hemoglobin 16.3; Platelets 220; Potassium 4.1; Sodium 137   Wt Readings from Last 3 Encounters:  02/21/20 185 lb 3.2 oz (84 kg)  01/24/20 184 lb 12.8 oz (83.8 kg)  01/24/20 183 lb (83 kg)     Other studies Reviewed: Additional studies/ records that were reviewed today include: Previous EP office notes.   Assessment and Plan:  1. Chronic systolic dysfunction s/p  s/p St. Jude single chamber ICD S/p Barostim (standard) implantation 12/31/2019 NYHA II-III symptoms.   Device titrated from 3.0 millamp to 5.0 milliamp by increments of 0.4 with good blood pressure response and no stimulation.  Device impedence stable. Pt goals are to walk without fatigue or SOB and to work up to playing a round of golf without symptoms.   Normal device function See scanned report. Will follow up in 4 weeks to continue titration with goal of 6-8 milliamps for chronic settings.  In person ICD check due 06/2020.   2. Ischemic CMP Denies ischemic symptoms  Current medicines are reviewed at length with the patient today.   The patient does not have concerns regarding his medicines.  The following changes were made today:  none  Labs/ tests ordered today include:  No orders of the defined types were placed in this encounter.  Disposition:   Follow up in 4 Weeks for further barostim titration.  Jacalyn Lefevre, PA-C  02/21/2020 8:50 AM  [facility] [address] [place] [address] [phone] (office) [phone] (fax)

## 2020-02-22 ENCOUNTER — Other Ambulatory Visit: Payer: Self-pay | Admitting: Internal Medicine

## 2020-02-22 ENCOUNTER — Ambulatory Visit: Payer: Medicare Other | Attending: Internal Medicine

## 2020-02-22 DIAGNOSIS — Z23 Encounter for immunization: Secondary | ICD-10-CM

## 2020-02-22 NOTE — Progress Notes (Signed)
° °  Covid-19 Vaccination Clinic  Name:  Brandon [name]    MRN: [id number] DOB: 09-05-1955  02/22/2020  Brandon [name] was observed post Covid-19 immunization for 15 minutes without incident. He was provided with Vaccine Information Sheet and instruction to access the V-Safe system.   Brandon [name] was instructed to call 911 with any severe reactions post vaccine:  Difficulty breathing   Swelling of face and throat   A fast heartbeat   A bad rash all over body   Dizziness and weakness

## 2020-02-22 NOTE — Telephone Encounter (Signed)
Advanced Heart Failure Patient Advocate Encounter   Patient was approved to receive Farxiga from [employer]  Patient ID: [id number] Effective dates: 02/22/20 through 05/12/20

## 2020-02-23 ENCOUNTER — Encounter: Payer: Self-pay | Admitting: Gastroenterology

## 2020-02-23 ENCOUNTER — Ambulatory Visit (INDEPENDENT_AMBULATORY_CARE_PROVIDER_SITE_OTHER): Payer: Medicare Other | Admitting: Gastroenterology

## 2020-02-23 VITALS — BP 94/62 | HR 70 | Ht 72.0 in | Wt 184.0 lb

## 2020-02-23 DIAGNOSIS — I509 Heart failure, unspecified: Secondary | ICD-10-CM

## 2020-02-23 DIAGNOSIS — I255 Ischemic cardiomyopathy: Secondary | ICD-10-CM | POA: Diagnosis not present

## 2020-02-23 DIAGNOSIS — Z86010 Personal history of colonic polyps: Secondary | ICD-10-CM | POA: Diagnosis not present

## 2020-02-23 DIAGNOSIS — Z7902 Long term (current) use of antithrombotics/antiplatelets: Secondary | ICD-10-CM

## 2020-02-23 NOTE — Progress Notes (Signed)
HPI :  64 year old male with CAD/MI / aortic stenosis, hypertension / hyperlipidemia / heart failure, history of colon polyps, referred by Grier Mitts MD for a surveillance colonoscopy.  He was initially supposed to be seen on 10/26/19 with Tye Savoy but cancelled his appointment at that time as he needed to see cardiology first. He is now here after his cardiac evaluation.   He was admitted late December 2020 with chest pain.  He was taken to the Cath Lab and underwent PCI of LAD. He is on Plavix.  His last echocardiogram 08/09/2019 showed an EF of 25 to 30%.  He has since undergone ICD placement. More recently he underwent Barostim placement with cardiology. Repeat echo in June still shows EF 25-30%.   He denies any problems with his bowels.  No blood in his stool.  He denies any family history of colon cancer.  He is generally feeling pretty well.  He continues to take his Plavix.  No chest pain, no shortness of breath.  He tolerated barrel stent placement, states it went well.  We reviewed his prior colonoscopy results from 2015.  A 13 mm polyp removed in the ascending colon and another smaller polyp from the sigmoid.  The ascending colon polyp was sessile serrated.  He was told to repeat exam in 3 years and is overdue for that.  Colonoscopy 01/05/2014 - 1. Sessile polyp measuring 13 mm in size was found in the ascending colon; polypectomy was performed using snare cautery; the wound at the site was closed by placing hemoclips 2. Sessile polyp measuring 3 mm in size was found in the sigmoid colon; polypectomy was performed with a cold snare 3. Mild diverticulosis was noted in the sigmoid colon 4. Internal hemorrhoids  Diagnosis Surgical [P], ascending, sigmoid, polyp (2) - SESSILE SERRATED POLYP AND HYPERPLASTIC POLYP. NO HIGH GRADE DYSPLASIA OR MALIGNANCY.  Told to repeat in 3 years  Device History: Barostim(standard)implanted 4/08/1448JEH Chronic systolic CHF St.  JudeSingle ChamberICD implanted 6/3149FWY chronic systolic CHF/primary prevention  Echo 10/29/19 - EF 25-30%, grade I DD   Past Medical History:  Diagnosis Date  . AICD (automatic cardioverter/defibrillator) present 08/31/2019  . Ankylosing spondylitis ([place])   . Arthritis   . BENIGN PROSTATIC HYPERTROPHY 06/07/2008  . CHF (congestive heart failure) ([place])   . COLONIC POLYPS, HX OF 06/07/2008  . Depression   . H/O hiatal hernia   . Heart failure ([place])   . HYPERLIPIDEMIA 06/07/2008  . HYPERTENSION 06/07/2008  . Myocardial infarction [facility]) 2005   NSTEMI, s/p LAD stent  . NEPHROLITHIASIS, HX OF 06/07/2008  . STEMI (ST elevation myocardial infarction) ([place]) 04/27/2019     Past Surgical History:  Procedure Laterality Date  . CORONARY ANGIOPLASTY WITH STENT PLACEMENT  2005   LAD stent, jailed diagonal  . CORONARY/GRAFT ACUTE MI REVASCULARIZATION N/A 04/27/2019   Procedure: Coronary/Graft Acute MI Revascularization;  Surgeon: [name], MD;  Location: [place] [facility];  Service: Cardiovascular;  Laterality: N/A;  . ICD IMPLANT  08/31/2019  . ICD IMPLANT N/A 08/31/2019   Procedure: ICD IMPLANT;  Surgeon: [name], MD;  Location: [place] CV LAB;  Service: Cardiovascular;  Laterality: N/A;  . LAMINECTOMY     lumbar  . LEFT HEART CATH AND CORONARY ANGIOGRAPHY N/A 04/27/2019   Procedure: LEFT HEART CATH AND CORONARY ANGIOGRAPHY;  Surgeon: [name], MD;  Location: [place] CV LAB;  Service: Cardiovascular;  Laterality: N/A;  . LUMBAR FUSION  01/2012  . RIGHT HEART  CATH N/A 04/27/2019   Procedure: RIGHT HEART CATH;  Surgeon: [name], Pete M, MD;  Location: [place] [facility];  Service: Cardiovascular;  Laterality: N/A;  . TONSILLECTOMY    . warthin tumor removal Left 2014   Family History  Problem Relation Age of Onset  . Depression Father   . Arthritis Sister        RA  . Heart failure Mother   . Other Neg Hx   . Colon cancer Neg Hx   . Esophageal  cancer Neg Hx   . Rectal cancer Neg Hx   . Stomach cancer Neg Hx    Social History   Tobacco Use  . Smoking status: Former Smoker    Packs/day: 0.50    Years: 40.00    Pack years: 20.00    Types: Cigarettes    Quit date: 07/26/2019    Years since quitting: 0.5  . Smokeless tobacco: Never Used  Vaping Use  . Vaping Use: Never used  Substance Use Topics  . Alcohol use: Yes    Alcohol/week: 7.0 standard drinks    Types: 7 Shots of liquor per week    Comment: "2 vodka tonics a night"  . Drug use: Yes    Comment: occasionally - CBD oil   Current Outpatient Medications  Medication Sig Dispense Refill  . acetaminophen (TYLENOL) 325 MG tablet Take 1-2 tablets (325-650 mg total) by mouth every 4 (four) hours as needed for mild pain.    [name] aspirin 81 MG chewable tablet Chew 1 tablet (81 mg total) by mouth daily. 30 tablet 3  . atorvastatin (LIPITOR) 80 MG tablet TAKE 1 TABLET BY MOUTH ONCE DAILY AT 6PM. 90 tablet 3  . Carboxymethylcellulose Sodium (ARTIFICIAL TEARS OP) Place 1 drop into both eyes every other day.     . carvedilol (COREG) 3.125 MG tablet Take 1 tablet (3.125 mg total) by mouth 2 (two) times daily. 180 tablet 3  . citalopram (CELEXA) 10 MG tablet Take 10 mg by mouth 2 (two) times a week.    . clopidogrel (PLAVIX) 75 MG tablet take 75 mg by mouth daily. 90 tablet 3  . dapagliflozin propanediol (FARXIGA) 10 MG TABS tablet Take 1 tablet (10 mg total) by mouth daily. 30 tablet 11  . digoxin (LANOXIN) 0.125 MG tablet TAKE 1 TABLET BY MOUTH DAILY 90 tablet 3  . diphenhydrAMINE (BENADRYL) 25 MG tablet Take 50 mg by mouth at bedtime as needed for sleep.    [name] ENTRESTO 24-26 MG TAKE 1 TABLET BY MOUTH 2 (TWO) TIMES DAILY. 180 tablet 3  . eplerenone (INSPRA) 50 MG tablet Take 1 tablet (50 mg total) by mouth daily. 90 tablet 3  . ezetimibe (ZETIA) 10 MG tablet Take 1 tablet (10 mg total) by mouth daily. 90 tablet 3  . furosemide (LASIX) 20 MG tablet Take 2 tablets (40 mg total) by mouth  as needed. 180 tablet 0  . inFLIXimab in sodium chloride 0.9 % Inject into the vein every 6 (six) weeks.    [name] loratadine (CLARITIN) 10 MG tablet Take 10 mg by mouth daily as needed for allergies.    . tamsulosin (FLOMAX) 0.4 MG CAPS capsule TAKE 1 CAPSULE(0.4 MG) BY MOUTH DAILY 90 capsule 1   No current facility-administered medications for this visit.   No Known Allergies   Review of Systems: All systems reviewed and negative except where noted in HPI.   Lab Results  Component Value Date   WBC 7.8 02/21/2020  HGB 16.4 02/21/2020   HCT 50.9 02/21/2020   MCV 89 02/21/2020   PLT 195 02/21/2020    Lab Results  Component Value Date   CREATININE 0.84 02/21/2020   BUN 16 02/21/2020   NA 138 02/21/2020   K 4.6 02/21/2020   CL 99 02/21/2020   CO2 26 02/21/2020    Lab Results  Component Value Date   ALT 19 12/23/2019   AST 17 12/23/2019   ALKPHOS 80 12/23/2019   BILITOT 1.1 12/23/2019     Physical Exam: BP 94/62   Pulse 70   Ht 6' (1.829 m)   Wt 184 lb (83.5 kg)   BMI 24.95 kg/m  Constitutional: Pleasant,well-developed, male in no acute distress. HEENT: Normocephalic and atraumatic. Conjunctivae are normal.  Neck supple.  Cardiovascular: Normal rate, regular rhythm. 2/6 SEM Pulmonary/chest: Effort normal and breath sounds normal.  Abdominal: Soft, nondistended, nontender.  There are no masses palpable.  Extremities: no edema Lymphadenopathy: No cervical adenopathy noted. Neurological: Alert and oriented to person place and time. Skin: Skin is warm and dry. No rashes noted. Psychiatric: Normal mood and affect. Behavior is normal.   ASSESSMENT AND PLAN: 64 y/o male here for new patient assessment of the following:  History of colon polyps / CHF / antiplatelet use - patient is overdue for surveillance colonoscopy given history of colon polyps.  His case was delayed due to cardiac history as outlined above.  His EF remains between 25 and 30% and he continues to  take Plavix.  That being said he is otherwise stable on his current regimen.  He states he has been cleared by cardiology to have a colonoscopy.  I discussed colonoscopy including risk and benefits of the procedure and that of anesthesia.  Given his ejection fraction is low he is not a candidate for a colonoscopy at the [facility] and his case must be done at the hospital for anesthesia support.  We discussed this issue and he is agreement with that.  Unfortunately there is quite a backlog for hospital cases right now, anticipate we can do this in the next few months but we cannot give him a date for scheduling currently, he will be put on the wait list and contacted for scheduling when dates are open.  He will need approval from cardiology to hold his Plavix for 5 days prior to colonoscopy.  Otherwise feels well without complaints at this time.  He is in agreement with the plan and hope to schedule in the upcoming few months.  Bono Cellar, MD [facility]  CC: [name], MD

## 2020-02-23 NOTE — Patient Instructions (Signed)
If you are age 66 or older, your body mass index should be between 23-30. Your Body mass index is 24.95 kg/m. If this is out of the aforementioned range listed, please consider follow up with your Primary Care Provider.  If you are age 6 or younger, your body mass index should be between 19-25. Your Body mass index is 24.95 kg/m. If this is out of the aformentioned range listed, please consider follow up with your Primary Care Provider.   We will call you to schedule your colonoscopy at [facility] when the next date becomes available.  We will need to get clearance from Cardiology to hold your Plavix for 5 days prior to your procedure.  You will also need to be tested for Covid prior to the procedure and you will be asked to quarantine until the date.   Thank you for entrusting me with your care and for choosing [facility], Dr. Moorcroft Cellar

## 2020-02-23 NOTE — Telephone Encounter (Signed)
Pt is scheduled for a f/u for his Celexa on Friday 02/25/2020

## 2020-02-24 NOTE — Telephone Encounter (Signed)
Pt  is scheduled for a MyChart visit for his Celexa renewal with Dr Volanda Napoleon tomorrow 02/25/2020

## 2020-02-25 ENCOUNTER — Encounter: Payer: Self-pay | Admitting: Family Medicine

## 2020-02-25 ENCOUNTER — Other Ambulatory Visit: Payer: Self-pay

## 2020-02-25 ENCOUNTER — Telehealth (INDEPENDENT_AMBULATORY_CARE_PROVIDER_SITE_OTHER): Payer: Medicare Other | Admitting: Family Medicine

## 2020-02-25 ENCOUNTER — Encounter (INDEPENDENT_AMBULATORY_CARE_PROVIDER_SITE_OTHER): Payer: Medicare Other | Admitting: Ophthalmology

## 2020-02-25 DIAGNOSIS — I10 Essential (primary) hypertension: Secondary | ICD-10-CM

## 2020-02-25 DIAGNOSIS — F339 Major depressive disorder, recurrent, unspecified: Secondary | ICD-10-CM

## 2020-02-25 DIAGNOSIS — H35033 Hypertensive retinopathy, bilateral: Secondary | ICD-10-CM | POA: Diagnosis not present

## 2020-02-25 DIAGNOSIS — F419 Anxiety disorder, unspecified: Secondary | ICD-10-CM | POA: Diagnosis not present

## 2020-02-25 DIAGNOSIS — H35712 Central serous chorioretinopathy, left eye: Secondary | ICD-10-CM

## 2020-02-25 DIAGNOSIS — H43813 Vitreous degeneration, bilateral: Secondary | ICD-10-CM

## 2020-02-25 DIAGNOSIS — I255 Ischemic cardiomyopathy: Secondary | ICD-10-CM

## 2020-02-25 DIAGNOSIS — H353132 Nonexudative age-related macular degeneration, bilateral, intermediate dry stage: Secondary | ICD-10-CM | POA: Diagnosis not present

## 2020-02-25 MED ORDER — CITALOPRAM HYDROBROMIDE 10 MG PO TABS: 5.0000 mg | ORAL_TABLET | Freq: Every day | ORAL | 1 refills | Status: DC

## 2020-02-25 NOTE — Progress Notes (Signed)
Error with E prescribe, Rx called in verbal

## 2020-02-25 NOTE — Progress Notes (Signed)
Virtual Visit via Video Note  I connected with Brandon [name] on 02/25/20 at  1:00 PM EDT by a video enabled telemedicine application 2/2 EQAST-41 pandemic and verified that I am speaking with the correct person using two identifiers.  Location patient: home Location provider:work or home office Persons participating in the virtual visit: patient, provider  I discussed the limitations of evaluation and management by telemedicine and the availability of in person appointments. The patient expressed understanding and agreed to proceed.   HPI: Pt is a 64 yo male with pmh sig for history of [place], mild aortic stenosis, ischemic cardiomyopathy, CHF, HTN, history of tobacco use, ankylosing spondylitis, history of renal calculi, HLD, depression, anxiety, colon polyps who was seen for follow-up.  Pt states he has been doing well.  Trying to stay safe.  Pt got a Pfizer COVID-19 booster vaccine this wk.  Pt has been on Celexa 10 mg since his heart attack. Currently feels like mood is stable.  Pt is unsure if the medication is really needed.  Pt endorses decreased energy once in a while, decreased appetite, and decreased interest in things at times.  Pt has been out of Celexa for a day.  He did notice a slight HA since being out of the med.    Pt working part time.   ROS: See pertinent positives and negatives per HPI.  Past Medical History:  Diagnosis Date  . AICD (automatic cardioverter/defibrillator) present 08/31/2019  . Ankylosing spondylitis ([place])   . Arthritis   . BENIGN PROSTATIC HYPERTROPHY 06/07/2008  . CHF (congestive heart failure) ([place])   . COLONIC POLYPS, HX OF 06/07/2008  . Depression   . H/O hiatal hernia   . Heart failure ([place])   . HYPERLIPIDEMIA 06/07/2008  . HYPERTENSION 06/07/2008  . Myocardial infarction [facility]) 2005   NSTEMI, s/p LAD stent  . NEPHROLITHIASIS, HX OF 06/07/2008  . STEMI (ST elevation myocardial infarction) ([place]) 04/27/2019    Past Surgical History:  Procedure  Laterality Date  . CORONARY ANGIOPLASTY WITH STENT PLACEMENT  2005   LAD stent, jailed diagonal  . CORONARY/GRAFT ACUTE MI REVASCULARIZATION N/A 04/27/2019   Procedure: Coronary/Graft Acute MI Revascularization;  Surgeon: [name], MD;  Location: [place] [facility];  Service: Cardiovascular;  Laterality: N/A;  . ICD IMPLANT  08/31/2019  . ICD IMPLANT N/A 08/31/2019   Procedure: ICD IMPLANT;  Surgeon: [name], MD;  Location: [place] [facility];  Service: Cardiovascular;  Laterality: N/A;  . LAMINECTOMY     lumbar  . LEFT HEART CATH AND CORONARY ANGIOGRAPHY N/A 04/27/2019   Procedure: LEFT HEART CATH AND CORONARY ANGIOGRAPHY;  Surgeon: [name], MD;  Location: [place] [facility];  Service: Cardiovascular;  Laterality: N/A;  . LUMBAR FUSION  01/2012  . RIGHT HEART CATH N/A 04/27/2019   Procedure: RIGHT HEART CATH;  Surgeon: [name], Joaopedro M, MD;  Location: [place] CV LAB;  Service: Cardiovascular;  Laterality: N/A;  . TONSILLECTOMY    . warthin tumor removal Left 2014    Family History  Problem Relation Age of Onset  . Depression Father   . Arthritis Sister        RA  . Heart failure Mother   . Other Neg Hx   . Colon cancer Neg Hx   . Esophageal cancer Neg Hx   . Rectal cancer Neg Hx   . Stomach cancer Neg Hx      Current Outpatient Medications:  .  acetaminophen (TYLENOL) 325 MG  tablet, Take 1-2 tablets (325-650 mg total) by mouth every 4 (four) hours as needed for mild pain., Disp:  , Rfl:  .  aspirin 81 MG chewable tablet, Chew 1 tablet (81 mg total) by mouth daily., Disp: 30 tablet, Rfl: 3 .  atorvastatin (LIPITOR) 80 MG tablet, TAKE 1 TABLET BY MOUTH ONCE DAILY AT 6PM., Disp: 90 tablet, Rfl: 3 .  Carboxymethylcellulose Sodium (ARTIFICIAL TEARS OP), Place 1 drop into both eyes every other day. , Disp: , Rfl:  .  carvedilol (COREG) 3.125 MG tablet, Take 1 tablet (3.125 mg total) by mouth 2 (two) times daily., Disp: 180 tablet, Rfl: 3 .  citalopram  (CELEXA) 10 MG tablet, Take 10 mg by mouth 2 (two) times a week., Disp: , Rfl:  .  clopidogrel (PLAVIX) 75 MG tablet, take 75 mg by mouth daily., Disp: 90 tablet, Rfl: 3 .  dapagliflozin propanediol (FARXIGA) 10 MG TABS tablet, Take 1 tablet (10 mg total) by mouth daily., Disp: 30 tablet, Rfl: 11 .  digoxin (LANOXIN) 0.125 MG tablet, TAKE 1 TABLET BY MOUTH DAILY, Disp: 90 tablet, Rfl: 3 .  diphenhydrAMINE (BENADRYL) 25 MG tablet, Take 50 mg by mouth at bedtime as needed for sleep., Disp: , Rfl:  .  ENTRESTO 24-26 MG, TAKE 1 TABLET BY MOUTH 2 (TWO) TIMES DAILY., Disp: 180 tablet, Rfl: 3 .  eplerenone (INSPRA) 50 MG tablet, Take 1 tablet (50 mg total) by mouth daily., Disp: 90 tablet, Rfl: 3 .  ezetimibe (ZETIA) 10 MG tablet, Take 1 tablet (10 mg total) by mouth daily., Disp: 90 tablet, Rfl: 3 .  furosemide (LASIX) 20 MG tablet, Take 2 tablets (40 mg total) by mouth as needed., Disp: 180 tablet, Rfl: 0 .  inFLIXimab in sodium chloride 0.9 %, Inject into the vein every 6 (six) weeks., Disp: , Rfl:  .  loratadine (CLARITIN) 10 MG tablet, Take 10 mg by mouth daily as needed for allergies., Disp: , Rfl:  .  tamsulosin (FLOMAX) 0.4 MG CAPS capsule, TAKE 1 CAPSULE(0.4 MG) BY MOUTH DAILY, Disp: 90 capsule, Rfl: 1  EXAM:   VITALS per patient if applicable:  RR between 12-20 bpm  GENERAL: alert, oriented, appears well and in no acute distress  HEENT: wearing glasses, atraumatic, conjunctiva clear, no obvious abnormalities on inspection of external nose and ears  NECK: normal movements of the head and neck  LUNGS: on inspection no signs of respiratory distress, breathing rate appears normal, no obvious gross SOB, gasping or wheezing  CV: no obvious cyanosis  MS: moves all visible extremities without noticeable abnormality  PSYCH/NEURO: pleasant and cooperative, no obvious depression or anxiety, speech and thought processing grossly intact  ASSESSMENT AND PLAN:  Discussed the following assessment  and plan:  Depression, recurrent (HCC)  -stable -PHQ 9 score initially 14, then 10 with this provider -discussed attempting to wean Celexa from 10 mg to 5 mg daily -Rx sent to pharmacy -for increased symptoms will readjust dose if needed. -self care encouraged - Plan: citalopram (CELEXA) 10 MG tablet  Anxiety -stable -GAD 7 score 5 -discussed weaning celexa from 10 mg to 5 mg   f/u in 4-6 wks, sooner if needed.  I discussed the assessment and treatment plan with the patient. The patient was provided an opportunity to ask questions and all were answered. The patient agreed with the plan and demonstrated an understanding of the instructions.   The patient was advised to call back or seek an in-person evaluation if the symptoms worsen  or if the condition fails to improve as anticipated.  [name], MD

## 2020-03-01 ENCOUNTER — Ambulatory Visit (INDEPENDENT_AMBULATORY_CARE_PROVIDER_SITE_OTHER): Payer: Medicare Other

## 2020-03-01 ENCOUNTER — Other Ambulatory Visit (HOSPITAL_COMMUNITY): Payer: Self-pay

## 2020-03-01 DIAGNOSIS — I255 Ischemic cardiomyopathy: Secondary | ICD-10-CM

## 2020-03-01 DIAGNOSIS — I5022 Chronic systolic (congestive) heart failure: Secondary | ICD-10-CM

## 2020-03-01 MED ORDER — DAPAGLIFLOZIN PROPANEDIOL 10 MG PO TABS: 10.0000 mg | ORAL_TABLET | Freq: Every day | ORAL | 11 refills | Status: DC

## 2020-03-03 LAB — CUP PACEART REMOTE DEVICE CHECK
Battery Remaining Longevity: 101 mo
Battery Remaining Percentage: 92 %
Battery Voltage: 3.01 V
Brady Statistic RV Percent Paced: 1 %
Date Time Interrogation Session: 20211020055838
HighPow Impedance: 80 Ohm
Implantable Lead Implant Date: 20210420
Implantable Lead Location: 753860
Implantable Pulse Generator Implant Date: 20210420
Lead Channel Impedance Value: 500 Ohm
Lead Channel Pacing Threshold Amplitude: 0.5 V
Lead Channel Pacing Threshold Pulse Width: 0.5 ms
Lead Channel Sensing Intrinsic Amplitude: 12 mV
Lead Channel Setting Pacing Amplitude: 2.5 V
Lead Channel Setting Pacing Pulse Width: 0.5 ms
Lead Channel Setting Sensing Sensitivity: 0.5 mV
Pulse Gen Serial Number: 111013950

## 2020-03-07 NOTE — Progress Notes (Signed)
Remote ICD transmission.   

## 2020-03-10 ENCOUNTER — Telehealth: Payer: Self-pay

## 2020-03-10 ENCOUNTER — Other Ambulatory Visit: Payer: Self-pay

## 2020-03-10 DIAGNOSIS — Z86010 Personal history of colonic polyps: Secondary | ICD-10-CM

## 2020-03-10 NOTE — Telephone Encounter (Signed)
Patient has been scheduled for hospital colonoscopy at [facility] on 04/24/20 at 7:30 AM. Patient is to arrive at 6 AM.  Pre-visit scheduled for 03/15/20 at 4 PM.  [place] is scheduled for 04/20/20 at 9 AM.  Plavix hold request has been sent to cardiology via epic.

## 2020-03-10 NOTE — Telephone Encounter (Signed)
[facility] Pre-operative Risk Assessment     Request for surgical clearance:     Endoscopy Procedure  What type of surgery is being performed?     Colonoscopy at [facility]  When is this surgery scheduled?     04/24/2020  What type of clearance is required ?   Pharmacy  Are there any medications that need to be held prior to surgery and how long? Plavix - hold for 5 days  Practice name and name of physician performing surgery?      [facility]  What is your office phone and fax number?      Phone- [phone]  [phone]  Anesthesia type (None, local, MAC, general) ?       MAC

## 2020-03-10 NOTE — Telephone Encounter (Signed)
Ok to hold plavix in December for colonoscopy

## 2020-03-10 NOTE — Telephone Encounter (Signed)
   Primary Cardiologist: Glori Bickers, MD  Chart reviewed as part of pre-operative protocol coverage.  Dr. [name] - this patient follows with your for ischemic cardiomyopathy following a STEMI 04/27/2019 where he was found to have occluded LAD s/p PCI/DES, and moderate LCx stenosis and CTO of RCA with L>R collaterals which were medically managed. He was recommended to continue DAPT x12 months uninterrupted.   We received a request for preoperative evaluation for a colonoscopy 04/26/2020. He will be just shy of the 1 year mark, but any objections to holding plavix 5 days prior to his colonoscopy if no new anginal symptoms? Please route your response back to P CV DIV PREOP. Thank you!  Abigail Butts, PA-C 03/10/2020, 1:51 PM

## 2020-03-10 NOTE — Telephone Encounter (Signed)
   Primary Cardiologist: [name], MD  Chart reviewed as part of pre-operative protocol coverage.   Per Dr. [name], patient can hold plavix 5 days prior to his upcoming colonoscopy with plans to restart as soon as he is cleared to do so by his gastroenterologist.   I will route this recommendation to the requesting party via [place] fax function and remove from pre-op pool.  Please call with questions.  Abigail Butts, PA-C 03/10/2020, 5:44 PM

## 2020-03-15 ENCOUNTER — Other Ambulatory Visit: Payer: Self-pay

## 2020-03-15 ENCOUNTER — Ambulatory Visit (AMBULATORY_SURGERY_CENTER): Payer: Self-pay | Admitting: *Deleted

## 2020-03-15 VITALS — Ht 72.0 in | Wt 185.0 lb

## 2020-03-15 DIAGNOSIS — Z86010 Personal history of colonic polyps: Secondary | ICD-10-CM

## 2020-03-15 DIAGNOSIS — Z01818 Encounter for other preprocedural examination: Secondary | ICD-10-CM

## 2020-03-15 MED ORDER — SUTAB 1479-225-188 MG PO TABS: 1.0000 | ORAL_TABLET | ORAL | 0 refills | Status: DC

## 2020-03-15 NOTE — Progress Notes (Signed)
Patient is here in-person for PV. Patient denies any allergies to eggs or soy. Patient denies any problems with anesthesia/sedation. Patient denies any oxygen use at home. Patient denies taking any diet/weight loss medications, is on blood thinner. Patient is not being treated for [name] or C-diff. Patient is aware of our care-partner policy and [id number] safety protocol.   COVID-19 vaccines completed on 02/2020 booster, per patient.   Prep Prescription coupon given to the patient.

## 2020-03-22 NOTE — Progress Notes (Addendum)
Electrophysiology Office Note Date: 03/23/2020  ID:  Brandon [name], DOB 11-04-1955, MRN [id number]  PCP: Brandon Ruddy, MD Primary Cardiologist: Dr. Aundra Dubin Electrophysiologist: [name], MD   CC: Barostim titration   Brandon [name] is a 64 y.o. male seen today for barostim titration. he is doing well since implantation. At last visit, pt device titrated from 3.0 to 5.0 by 0.4 ma intervals with good BP/HR response and no stimulation  The patients goals for barostim titration are to walk without fatigue, climb stairs and inclines without SOB, and to play a round of golf.   Since last visit he is doing very well. He answered a KCCQ-12 today. He reports less fatigue, less SOB on a weekly basis, less orthopnea, less limitation of his QOL, and more satisfied with his overall symptoms.   Device History: Barostim(standard)implanted 4/98/2641RAX Chronic systolic CHF St. JudeSingle ChamberICD implanted 0/9407WKG chronic systolic CHF/primary prevention History of appropriate therapy:No History of AAD therapy:No   Past Medical History:  Diagnosis Date  . AICD (automatic cardioverter/defibrillator) present 08/31/2019  . Ankylosing spondylitis ([place])   . Arthritis   . BENIGN PROSTATIC HYPERTROPHY 06/07/2008  . CHF (congestive heart failure) ([place])   . COLONIC POLYPS, HX OF 06/07/2008  . Depression   . H/O hiatal hernia   . Heart failure ([place])   . HYPERLIPIDEMIA 06/07/2008  . HYPERTENSION 06/07/2008  . Myocardial infarction [facility]) 2005   NSTEMI, s/p LAD stent  . NEPHROLITHIASIS, HX OF 06/07/2008  . STEMI (ST elevation myocardial infarction) ([place]) 04/27/2019   Past Surgical History:  Procedure Laterality Date  . CORONARY ANGIOPLASTY WITH STENT PLACEMENT  2005   LAD stent, jailed diagonal  . CORONARY/GRAFT ACUTE MI REVASCULARIZATION N/A 04/27/2019   Procedure: Coronary/Graft Acute MI Revascularization;  Surgeon: [name], MD;  Location: [place] CV LAB;   Service: Cardiovascular;  Laterality: N/A;  . ICD IMPLANT  08/31/2019  . ICD IMPLANT N/A 08/31/2019   Procedure: ICD IMPLANT;  Surgeon: [name], MD;  Location: [place] [facility] LAB;  Service: Cardiovascular;  Laterality: N/A;  . LAMINECTOMY     lumbar  . LEFT HEART CATH AND CORONARY ANGIOGRAPHY N/A 04/27/2019   Procedure: LEFT HEART CATH AND CORONARY ANGIOGRAPHY;  Surgeon: [name], MD;  Location: [place] [facility];  Service: Cardiovascular;  Laterality: N/A;  . LUMBAR FUSION  01/2012  . RIGHT HEART CATH N/A 04/27/2019   Procedure: RIGHT HEART CATH;  Surgeon: [name], Lauris M, MD;  Location: [place] [facility] LAB;  Service: Cardiovascular;  Laterality: N/A;  . TONSILLECTOMY    . warthin tumor removal Left 2014    Current Outpatient Medications  Medication Sig Dispense Refill  . acetaminophen (TYLENOL) 325 MG tablet Take 1-2 tablets (325-650 mg total) by mouth every 4 (four) hours as needed for mild pain.    [name] aspirin 81 MG chewable tablet Chew 1 tablet (81 mg total) by mouth daily. 30 tablet 3  . atorvastatin (LIPITOR) 80 MG tablet TAKE 1 TABLET BY MOUTH ONCE DAILY AT 6PM. 90 tablet 3  . Carboxymethylcellulose Sodium (ARTIFICIAL TEARS OP) Place 1 drop into both eyes every other day.     . carvedilol (COREG) 3.125 MG tablet Take 1 tablet (3.125 mg total) by mouth 2 (two) times daily. 180 tablet 3  . citalopram (CELEXA) 10 MG tablet Take 0.5 tablets (5 mg total) by mouth daily. 90 tablet 1  . clopidogrel (PLAVIX) 75 MG tablet take 75 mg by mouth daily.  90 tablet 3  . dapagliflozin propanediol (FARXIGA) 10 MG TABS tablet Take 1 tablet (10 mg total) by mouth daily. 90 tablet 11  . digoxin (LANOXIN) 0.125 MG tablet TAKE 1 TABLET BY MOUTH DAILY 90 tablet 3  . diphenhydrAMINE (BENADRYL) 25 MG tablet Take 50 mg by mouth at bedtime as needed for sleep.    [name] ENTRESTO 24-26 MG TAKE 1 TABLET BY MOUTH 2 (TWO) TIMES DAILY. 180 tablet 3  . eplerenone (INSPRA) 50 MG tablet Take 1 tablet (50 mg  total) by mouth daily. 90 tablet 3  . ezetimibe (ZETIA) 10 MG tablet Take 1 tablet (10 mg total) by mouth daily. 90 tablet 3  . furosemide (LASIX) 20 MG tablet Take 2 tablets (40 mg total) by mouth as needed. 180 tablet 0  . inFLIXimab in sodium chloride 0.9 % Inject into the vein every 6 (six) weeks.    [name] loratadine (CLARITIN) 10 MG tablet Take 10 mg by mouth daily as needed for allergies.    . Sodium Sulfate-Mag Sulfate-KCl (SUTAB) [phone] MG TABS Take 1 kit by mouth as directed. MANUFACTURER CODES!! BIN: [id number] PCN: CN GROUP: [id number] MEMBER ID: [id number];GXQ AS CASH;NO PRIOR AUTHORIZATION 24 tablet 0  . tamsulosin (FLOMAX) 0.4 MG CAPS capsule TAKE 1 CAPSULE(0.4 MG) BY MOUTH DAILY 90 capsule 1   No current facility-administered medications for this visit.    Allergies:   Patient has no known allergies.   Social History: Social History   Socioeconomic History  . Marital status: Single    Spouse name: Not on file  . Number of children: Not on file  . Years of education: Not on file  . Highest education level: Not on file  Occupational History  . 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 Smoking status: Former Smoker    Packs/day: 0.50    Years: 40.00    Pack years: 20.00    Types: Cigarettes    Quit date: 07/26/2019    Years since quitting: 0.6  . Smokeless tobacco: Never Used  Vaping Use  . Vaping Use: Never used  Substance and Sexual Activity  . Alcohol use: Yes    Alcohol/week: 7.0 standard drinks    Types: 7 Shots of liquor per week    Comment: "2 vodka tonics a night"  . Drug use: Yes    Comment: occasionally - CBD oil  . Sexual activity: Not on file  Other Topics Concern  . Not on file  Social History Narrative   Lives in [place] alone   Works part time for a [employer] in [place] Strain:   . Difficulty of Paying Living Expenses: Not on file  Food  Insecurity: No Food Insecurity  . Worried About Charity fundraiser in the Last Year: Never true  . Ran Out of Food in the Last Year: Never true  Transportation Needs: No Transportation Needs  . Lack of Transportation (Medical): No  . Lack of Transportation (Non-Medical): No  Physical Activity: Sufficiently Active  . Days of Exercise per Week: 5 days  . Minutes of Exercise per Session: 40 min  Stress:   . Feeling of Stress : Not on file  Social Connections:   . Frequency of Communication with Friends and Family: Not on file  . Frequency of Social Gatherings with Friends and Family: Not on file  . Attends Religious Services: Not on file  . Active  Member of Clubs or Organizations: Not on file  . Attends Archivist Meetings: Not on file  . Marital Status: Not on file  Intimate Partner Violence:   . Fear of Current or Ex-Partner: Not on file  . Emotionally Abused: Not on file  . Physically Abused: Not on file  . Sexually Abused: Not on file    Family History: Family History  Problem Relation Age of Onset  . Depression Father   . Arthritis Sister        RA  . Heart failure Mother   . Other Neg Hx   . Colon cancer Neg Hx   . Esophageal cancer Neg Hx   . Rectal cancer Neg Hx   . Stomach cancer Neg Hx   . Colon polyps Neg Hx      Review of Systems: All other systems reviewed and are otherwise negative except as noted above.  Physical Exam: Vitals:   03/23/20 0852  BP: 113/72  Pulse: 84  Weight: 184 lb (83.5 kg)     GEN- The patient is well appearing, alert and oriented x 3 today.   HEENT: normocephalic, atraumatic; sclera clear, conjunctiva pink; hearing intact; oropharynx clear; neck supple  Lungs- Clear to ausculation bilaterally, normal work of breathing.  No wheezes, rales, rhonchi Heart- Regular rate and rhythm, no murmurs, rubs or gallops  GI- soft, non-tender, non-distended, bowel sounds present  Extremities- no clubbing, cyanosis, or edema  MS- no  significant deformity or atrophy Skin- warm and dry, no rash or lesion; PPM pocket well healed Psych- euthymic mood, full affect Neuro- strength and sensation are intact  Barostim interrogation - Performed personally and reviewed in detail today. Programming as below, see scanned report.   EKG:  EKG is not ordered today.  Recent Labs: 04/27/2019: TSH 0.889 04/28/2019: Magnesium 1.9 10/13/2019: NT-Pro BNP 483 11/10/2019: B Natriuretic Peptide 186.1 12/23/2019: ALT 19 02/21/2020: BUN 16; Creatinine, Ser 0.84; Hemoglobin 16.4; Platelets 195; Potassium 4.6; Sodium 138   Wt Readings from Last 3 Encounters:  03/23/20 184 lb (83.5 kg)  03/15/20 185 lb (83.9 kg)  02/23/20 184 lb (83.5 kg)     Other studies Reviewed: Additional studies/ records that were reviewed today include: Previous office notes.   Assessment and Plan:  1. Chronic systolic dysfunction s/p St. Jude single chamber ICD S/p barostim (standard) implantation 12/31/2019 NYHA II symptoms currently.  Device titrated from 5.0 millamp to 7.0 milliamp by increments of 0.4 with good blood pressure response and no stimulation.  Device impedence stable. Pt goals are to to walk without fatigue, climb stairs and inclines without SOB, and to play a round of golf. He reports "No limitations from a HF standpoint" at this point. Normal device function See scanned report. Pt is now at goal for barostim programming. Chronic settings will be 7.0 ma @ 125 ms.  [id number] to be scanned in as well.   2. Ischemic CMP No s/s of ischemia  Current medicines are reviewed at length with the patient today.   The patient does not have concerns regarding his medicines.  The following changes were made today:  none  Labs/ tests ordered today include:  Orders Placed This Encounter  Procedures  . Pro b natriuretic peptide (BNP)  . Basic Metabolic Panel (BMET)    Disposition:   Follow up with EP APP in 3 Months for ICD and barostim check.     Jacalyn Lefevre, PA-C  03/23/2020 9:38 AM  [facility]  [address] [place] [address] [phone] (office) [phone] (fax)

## 2020-03-23 ENCOUNTER — Telehealth (HOSPITAL_COMMUNITY): Payer: Self-pay | Admitting: Pharmacy Technician

## 2020-03-23 ENCOUNTER — Encounter: Payer: Self-pay | Admitting: Student

## 2020-03-23 ENCOUNTER — Other Ambulatory Visit: Payer: Self-pay

## 2020-03-23 ENCOUNTER — Ambulatory Visit (INDEPENDENT_AMBULATORY_CARE_PROVIDER_SITE_OTHER): Payer: Medicare Other | Admitting: Student

## 2020-03-23 VITALS — BP 113/72 | HR 84 | Wt 184.0 lb

## 2020-03-23 DIAGNOSIS — I10 Essential (primary) hypertension: Secondary | ICD-10-CM | POA: Diagnosis not present

## 2020-03-23 DIAGNOSIS — I255 Ischemic cardiomyopathy: Secondary | ICD-10-CM

## 2020-03-23 DIAGNOSIS — I519 Heart disease, unspecified: Secondary | ICD-10-CM

## 2020-03-23 DIAGNOSIS — Z006 Encounter for examination for normal comparison and control in clinical research program: Secondary | ICD-10-CM

## 2020-03-23 NOTE — Patient Instructions (Addendum)
Medication Instructions:  *If you need a refill on your cardiac medications before your next appointment, please call your pharmacy*  Lab Work: Your physician has recommended that you have lab work today: BMET and Pro BNP If you have labs (blood work) drawn today and your tests are completely normal, you will receive your results only by: [name] MyChart Message (if you have MyChart) OR . A paper copy in the mail If you have any lab test that is abnormal or we need to change your treatment, we will call you to review the results.  Follow-Up: At [facility], you and your health needs are our priority.  As part of our continuing mission to provide you with exceptional heart care, we have created designated Provider Care Teams.  These Care Teams include your primary Cardiologist (physician) and Advanced Practice Providers (APPs -  Physician Assistants and Nurse Practitioners) who all work together to provide you with the care you need, when you need it.  We recommend signing up for the patient portal called "MyChart".  Sign up information is provided on this After Visit Summary.  MyChart is used to connect with patients for Virtual Visits (Telemedicine).  Patients are able to view lab/test results, encounter notes, upcoming appointments, etc.  Non-urgent messages can be sent to your provider as well.   To learn more about what you can do with MyChart, go to [email or web address].    Your next appointment:   Your physician recommends that you schedule a follow-up appointment in: 3 MONTHS with Oda Kilts, PA-C. -- Wednesday 06/28/20 at 8:50 am  Remote monitoring is used to monitor your ICD from home. This monitoring reduces the number of office visits required to check your device to one time per year. It allows [place] to keep an eye on the functioning of your device to ensure it is working properly. You are scheduled for a device check from home on 05/31/20. You may send your transmission at any time  that day. If you have a wireless device, the transmission will be sent automatically. After your physician reviews your transmission, you will receive a postcard with your next transmission date.   The format for your next appointment:   In Person with Legrand Como "Jonni Sanger" Chalmers Cater, PA-C

## 2020-03-23 NOTE — Telephone Encounter (Signed)
Spoke to patient regarding re-enrollment of Brandon [name] and [place] assistance with [employer] and [employer]. Will have the applications for the patient to sign at the check in desk.   Will follow up.

## 2020-03-24 LAB — BASIC METABOLIC PANEL
BUN/Creatinine Ratio: 25 — ABNORMAL HIGH (ref 10–24)
BUN: 20 mg/dL (ref 8–27)
CO2: 24 mmol/L (ref 20–29)
Calcium: 9.5 mg/dL (ref 8.6–10.2)
Chloride: 101 mmol/L (ref 96–106)
Creatinine, Ser: 0.8 mg/dL (ref 0.76–1.27)
GFR calc Af Amer: 108 mL/min/{1.73_m2} (ref >59)
GFR calc non Af Amer: 93 mL/min/{1.73_m2} (ref >59)
Glucose: 89 mg/dL (ref 65–99)
Potassium: 4.2 mmol/L (ref 3.5–5.2)
Sodium: 137 mmol/L (ref 134–144)

## 2020-03-24 LAB — PRO B NATRIURETIC PEPTIDE: NT-Pro BNP: 520 pg/mL — ABNORMAL HIGH (ref 0–376)

## 2020-03-29 NOTE — Telephone Encounter (Signed)
Patient came and signed both applications.   Will fax in applications on December 1st during the open re-enrollment period.

## 2020-04-05 LAB — LIPID PANEL
Chol/HDL Ratio: 2.5 ratio (ref 0.0–5.0)
Cholesterol, Total: 140 mg/dL (ref 100–199)
HDL: 57 mg/dL (ref >39)
LDL Chol Calc (NIH): 69 mg/dL (ref 0–99)
Triglycerides: 72 mg/dL (ref 0–149)
VLDL Cholesterol Cal: 14 mg/dL (ref 5–40)

## 2020-04-09 ENCOUNTER — Other Ambulatory Visit: Payer: Self-pay | Admitting: Family Medicine

## 2020-04-18 ENCOUNTER — Telehealth (INDEPENDENT_AMBULATORY_CARE_PROVIDER_SITE_OTHER): Payer: Medicare Other | Admitting: Internal Medicine

## 2020-04-18 ENCOUNTER — Encounter: Payer: Self-pay | Admitting: Internal Medicine

## 2020-04-18 VITALS — BP 111/81 | HR 86 | Ht 72.0 in | Wt 184.5 lb

## 2020-04-18 DIAGNOSIS — Z9861 Coronary angioplasty status: Secondary | ICD-10-CM | POA: Diagnosis not present

## 2020-04-18 DIAGNOSIS — I5022 Chronic systolic (congestive) heart failure: Secondary | ICD-10-CM

## 2020-04-18 DIAGNOSIS — E785 Hyperlipidemia, unspecified: Secondary | ICD-10-CM | POA: Diagnosis not present

## 2020-04-18 DIAGNOSIS — I255 Ischemic cardiomyopathy: Secondary | ICD-10-CM | POA: Diagnosis not present

## 2020-04-18 DIAGNOSIS — I2510 Atherosclerotic heart disease of native coronary artery without angina pectoris: Secondary | ICD-10-CM | POA: Diagnosis not present

## 2020-04-18 NOTE — Progress Notes (Signed)
Subjective:   Brandon [name] is a 64 y.o. male who presents for an Initial Medicare Annual Wellness Visit.  Review of Systems    N/A  Cardiac Risk Factors include: advanced age (>42mn, >>68women);hypertension;male gender;dyslipidemia     Objective:    Today's Vitals   04/19/20 0834 04/19/20 0835  BP: 116/62   Pulse: 91   Temp: 98.7 F (37.1 C)   TempSrc: Oral   SpO2: 97%   Weight: 187 lb 9 oz (85.1 kg)   Height: 6' (1.829 m)   PainSc:  2    Body mass index is 25.44 kg/m.  Advanced Directives 04/19/2020 12/31/2019 12/23/2019 11/24/2019 08/31/2019 08/31/2019 04/28/2019  Does Patient Have a Medical Advance Directive? Yes - Yes Yes - Yes Yes  Type of Advance Directive [place] will [place] Fernando[place] will - [place]Living will [place]Living will  Does patient want to make changes to medical advance directive? No - Patient declined - No - Patient declined - - No - Patient declined No - Patient declined  Copy of [place]ol[place]in Chart? No - copy requested No - copy requested No - copy requested - - No - copy requested -  Would patient like information on creating a medical advance directive? - - - - No - Patient declined No - Patient declined -  Pre-existing out of facility DNR order (yellow form or pink MOST form) - - - - - - -    Current Medications (verified) Outpatient Encounter Medications as of 04/19/2020  Medication Sig  . acetaminophen (TYLENOL) 325 MG tablet Take 1-2 tablets (325-650 mg total) by mouth every 4 (four) hours as needed for mild pain.  .Marland Kitchenaspirin 81 MG chewable tablet Chew 1 tablet (81 mg total) by mouth daily.  .Marland Kitchenatorvastatin (LIPITOR) 80 MG tablet TAKE 1 TABLET BY MOUTH ONCE DAILY AT 6PM. (Patient taking differently: Take 80 mg by mouth at bedtime. )  . Carboxymethylcellulose Sodium (ARTIFICIAL TEARS OP) Place 1 drop  into both eyes every other day.   . carvedilol (COREG) 3.125 MG tablet Take 1 tablet (3.125 mg total) by mouth 2 (two) times daily.  . citalopram (CELEXA) 10 MG tablet Take 0.5 tablets (5 mg total) by mouth daily.  . clopidogrel (PLAVIX) 75 MG tablet take 75 mg by mouth daily. (Patient taking differently: Take 75 mg by mouth daily. )  . dapagliflozin propanediol (FARXIGA) 10 MG TABS tablet Take 1 tablet (10 mg total) by mouth daily.  . digoxin (LANOXIN) 0.125 MG tablet TAKE 1 TABLET BY MOUTH DAILY (Patient taking differently: Take 0.125 mg by mouth daily. )  . diphenhydrAMINE (BENADRYL) 25 MG tablet Take 25 mg by mouth once a week.   .Marland KitchenENTRESTO 24-26 MG TAKE 1 TABLET BY MOUTH 2 (TWO) TIMES DAILY. (Patient taking differently: Take 1 tablet by mouth 2 (two) times daily. )  . eplerenone (INSPRA) 50 MG tablet Take 1 tablet (50 mg total) by mouth daily.  .Marland Kitchenezetimibe (ZETIA) 10 MG tablet Take 1 tablet (10 mg total) by mouth daily.  . furosemide (LASIX) 20 MG tablet Take 2 tablets (40 mg total) by mouth as needed. (Patient taking differently: Take 40 mg by mouth daily as needed for fluid. )  . inFLIXimab in sodium chloride 0.9 % Inject into the vein every 6 (six) weeks.  . Sodium Sulfate-Mag Sulfate-KCl (SUTAB) [phone] TABS Take 1 kit by  mouth as directed. MANUFACTURER CODES!! BIN: [id number] PCN: CN GROUP: [id number] MEMBER ID: [id number];EYC AS CASH;NO PRIOR AUTHORIZATION  . tamsulosin (FLOMAX) 0.4 MG CAPS capsule TAKE 1 CAPSULE(0.4 MG) BY MOUTH DAILY (Patient taking differently: Take 0.4 mg by mouth daily. )   No facility-administered encounter medications on file as of 04/19/2020.    Allergies (verified) Patient has no known allergies.   History: Past Medical History:  Diagnosis Date  . AICD (automatic cardioverter/defibrillator) present 08/31/2019  . Ankylosing spondylitis ([place])   . Arthritis   . BENIGN PROSTATIC HYPERTROPHY 06/07/2008  . CHF (congestive heart failure) ([place])   . COLONIC  POLYPS, HX OF 06/07/2008  . Depression   . H/O hiatal hernia   . Heart failure ([place])   . HYPERLIPIDEMIA 06/07/2008  . HYPERTENSION 06/07/2008  . Myocardial infarction [facility]) 2005   NSTEMI, s/p LAD stent  . NEPHROLITHIASIS, HX OF 06/07/2008  . STEMI (ST elevation myocardial infarction) ([place]) 04/27/2019   Past Surgical History:  Procedure Laterality Date  . CORONARY ANGIOPLASTY WITH STENT PLACEMENT  2005   LAD stent, jailed diagonal  . CORONARY/GRAFT ACUTE MI REVASCULARIZATION N/A 04/27/2019   Procedure: Coronary/Graft Acute MI Revascularization;  Surgeon: [name], MD;  Location: [facility];  Service: Cardiovascular;  Laterality: N/A;  . ICD IMPLANT  08/31/2019  . ICD IMPLANT N/A 08/31/2019   Procedure: ICD IMPLANT;  Surgeon: [name], MD;  Location: [facility];  Service: Cardiovascular;  Laterality: N/A;  . LAMINECTOMY     lumbar  . LEFT HEART CATH AND CORONARY ANGIOGRAPHY N/A 04/27/2019   Procedure: LEFT HEART CATH AND CORONARY ANGIOGRAPHY;  Surgeon: [name], MD;  Location: [facility];  Service: Cardiovascular;  Laterality: N/A;  . LUMBAR FUSION  01/2012  . RIGHT HEART CATH N/A 04/27/2019   Procedure: RIGHT HEART CATH;  Surgeon: [name], Helder M, MD;  Location: [facility];  Service: Cardiovascular;  Laterality: N/A;  . TONSILLECTOMY    . warthin tumor removal Left 2014   Family History  Problem Relation Age of Onset  . Depression Father   . Arthritis Sister        RA  . Heart failure Mother   . Other Neg Hx   . Colon cancer Neg Hx   . Esophageal cancer Neg Hx   . Rectal cancer Neg Hx   . Stomach cancer Neg Hx   . Colon polyps Neg Hx    Social History   Socioeconomic History  . Marital status: Single    Spouse name: Not on file  . Number of children: Not on file  . Years of education: Not on file  . Highest education level: Not on file  Occupational History  . 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 Smoking status: Former Smoker    Packs/day: 0.50    Years: 40.00    Pack years: 20.00    Types: Cigarettes    Quit date: 07/26/2019    Years since quitting: 0.7  . Smokeless tobacco: Never Used  Vaping Use  . Vaping Use: Never used  Substance and Sexual Activity  . Alcohol use: Yes    Alcohol/week: 7.0 standard drinks    Types: 7 Shots of liquor per week    Comment: "2 vodka tonics a night"  . Drug use: Yes    Comment: occasionally - CBD oil  . Sexual activity: Not on file  Other Topics Concern  .  Not on file  Social History Narrative   Lives in [place] alone   Works part time for a [employer] in [place]: [place]   . Difficulty of Paying Living Expenses: Not hard at all  Food Insecurity: No Food Insecurity  . Worried About Charity fundraiser in the Last Year: Never true  . Ran Out of Food in the Last Year: Never true  Transportation Needs: No Transportation Needs  . Lack of Transportation (Medical): No  . Lack of Transportation (Non-Medical): No  Physical Activity: Sufficiently Active  . Days of Exercise per Week: 5 days  . Minutes of Exercise per Session: 40 min  Stress: No Stress Concern Present  . Feeling of Stress : Not at all  Social Connections: Socially Isolated  . Frequency of Communication with Friends and Family: More than three times a week  . Frequency of Social Gatherings with Friends and Family: More than three times a week  . Attends Religious Services: Never  . Active Member of Clubs or Organizations: No  . Attends Archivist Meetings: Never  . Marital Status: Never married    Tobacco Counseling Counseling given: Not Answered   Clinical Intake:  Pre-visit preparation completed: Yes  Pain : 0-10 Pain Score: 2  Pain Type: Chronic pain Pain Location: Shoulder Pain Orientation: Right, Left Pain Descriptors / Indicators: Aching Pain  Onset: More than a month ago Pain Frequency: Intermittent     Diabetes: No  How often do you need to have someone help you when you read instructions, pamphlets, or other written materials from your doctor or pharmacy?: 1 - Never What is the last grade level you completed in school?: Some college  Diabetic?No   Interpreter Needed?: No  Information entered by :: [place] of Daily Living In your present state of health, do you have any difficulty performing the following activities: 04/19/2020 12/23/2019  Hearing? N N  Vision? N N  Difficulty concentrating or making decisions? - N  Walking or climbing stairs? N N  Dressing or bathing? N N  Doing errands, shopping? N N  Preparing Food and eating ? N -  Using the Toilet? N -  In the past six months, have you accidently leaked urine? N -  Do you have problems with loss of bowel control? N -  Managing your Medications? N -  Managing your Finances? N -  Housekeeping or managing your Housekeeping? N -  Some recent data might be hidden    Patient Care Team: [name], MD as PCP - General (Family Medicine) Stanford Breed Denice Bors, MD as PCP - Cardiology (Cardiology) [name], MD as PCP - Electrophysiology (Cardiology) Bensimhon, Shaune Pascal, MD as PCP - Advanced Heart Failure (Cardiology) Gavin Pound, MD as Consulting Physician (Rheumatology) Mercie Eon, MD as Referring Physician (Rheumatology)  Indicate any recent Medical Services you may have received from other than [facility] providers in the past year (date may be approximate).     Assessment:   This is a routine wellness examination for [facility].  Hearing/Vision screen  Hearing Screening   125Hz  250Hz  500Hz  1000Hz  2000Hz  3000Hz  4000Hz  6000Hz  8000Hz   Right ear:           Left ear:           Vision Screening Comments: Gets eyes examined twice per year. Has had retina issues   Dietary issues and exercise activities  discussed: Current Exercise Habits:  Home exercise routine, Type of exercise: walking, Time (Minutes): 45, Frequency (Times/Week): 5, Weekly Exercise (Minutes/Week): 225, Intensity: Mild, Exercise limited by: None identified  Goals    . Patient Stated     I will continue to walk 5 days a week for 40 minutes       Depression Screen PHQ 2/9 Scores 04/19/2020 02/25/2020 02/25/2020 09/28/2019 04/15/2019 12/08/2014 03/29/2013  PHQ - 2 Score 0 2 4 0 0 0 0  PHQ- 9 Score 0 10 14 - - - -    Fall Risk Fall Risk  04/19/2020 05/28/2019 03/29/2013  Falls in the past year? 0 0 No  Number falls in past yr: 0 - -  Injury with Fall? 0 - -  Risk for fall due to : No Fall Risks Other (Comment) -  Risk for fall due to: Comment - Back, neck, and hip pain from anylosing spondylitis. -  Follow up Falls evaluation completed;Falls prevention discussed Falls evaluation completed -    FALL RISK PREVENTION PERTAINING TO THE HOME:  Any stairs in or around the home? No  If so, are there any without handrails? No  Home free of loose throw rugs in walkways, pet beds, electrical cords, etc? Yes  Adequate lighting in your home to reduce risk of falls? Yes   ASSISTIVE DEVICES UTILIZED TO PREVENT FALLS:  Life alert? No  Use of a cane, walker or w/c? No  Grab bars in the bathroom? No  Shower chair or bench in shower? No  Elevated toilet seat or a handicapped toilet? No   TIMED UP AND GO:  Was the test performed? Yes .  Length of time to ambulate 10 feet: 4 sec.   Gait steady and fast without use of assistive device  Cognitive Function:    Cognitive screening not indicated based on direct observation    Immunizations Immunization History  Administered Date(s) Administered  . Influenza Split 02/01/2012  . Influenza Whole 02/25/2011  . Influenza,inj,Quad PF,6+ Mos 03/29/2013, 03/04/2017, 04/13/2018  . Influenza-Unspecified 02/01/2014, 01/14/2015, 02/20/2020  . PFIZER SARS-COV-2 Vaccination 06/18/2019, 07/13/2019, 02/22/2020  . Pneumococcal  Polysaccharide-23 04/19/2020  . Tdap 09/12/2011  . Zoster 12/04/2013  . Zoster Recombinat (Shingrix) 08/13/2016, 10/26/2016    TDAP status: Up to date  Flu Vaccine status: Up to date  Pneumococcal vaccine status: Up to date  Covid-19 vaccine status: Completed vaccines  Qualifies for Shingles Vaccine? Yes   Zostavax completed Yes   Shingrix Completed?: Yes  Screening Tests Health Maintenance  Topic Date Due  . Hepatitis C Screening  Never done  . COLONOSCOPY  01/05/2017  . TETANUS/TDAP  09/11/2021  . INFLUENZA VACCINE  Completed  . COVID-19 Vaccine  Completed  . PNA vac Low Risk Adult  Completed    Health Maintenance  Health Maintenance Due  Topic Date Due  . Hepatitis C Screening  Never done  . COLONOSCOPY  01/05/2017    Colorectal cancer screening: Referral to GI placed 04/19/2020. Pt aware the office will call re: appt.  Lung Cancer Screening: (Low Dose CT Chest recommended if Age 69-80 years, 30 pack-year currently smoking OR have quit w/in 15years.) does not qualify.   Lung Cancer Screening Referral: N/A   Additional Screening:  Hepatitis C Screening: does qualify;   Vision Screening: Recommended annual ophthalmology exams for early detection of glaucoma and other disorders of the eye. Is the patient up to date with their annual eye exam?  Yes  Who is the provider or  what is the name of the office in which the patient attends annual eye exams? Dr.Weaver If pt is not established with a provider, would they like to be referred to a provider to establish care? No .   Dental Screening: Recommended annual dental exams for proper oral hygiene  Community Resource Referral / Chronic Care Management: CRR required this visit?  No   CCM required this visit?  No      Plan:     I have personally reviewed and noted the following in the patient's chart:   . Medical and social history . Use of alcohol, tobacco or illicit drugs  . Current medications and  supplements . Functional ability and status . Nutritional status . Physical activity . Advanced directives . List of other physicians . Hospitalizations, surgeries, and ER visits in previous 12 months . Vitals . Screenings to include cognitive, depression, and falls . Referrals and appointments  In addition, I have reviewed and discussed with patient certain preventive protocols, quality metrics, and best practice recommendations. A written personalized care plan for preventive services as well as general preventive health recommendations were provided to patient.     Ofilia Neas, LPN   16/05/4430   Nurse Notes: None

## 2020-04-18 NOTE — Patient Instructions (Signed)
Medication Instructions:  Your physician recommends that you continue on your current medications as directed. Please refer to the Current Medication list given to you today.  *If you need a refill on your cardiac medications before your next appointment, please call your pharmacy*   Lab Work: FASTING lipid panel in 1 year to check cholesterol   If you have labs (blood work) drawn today and your tests are completely normal, you will receive your results only by: [name] MyChart Message (if you have MyChart) OR . A paper copy in the mail If you have any lab test that is abnormal or we need to change your treatment, we will call you to review the results.   Testing/Procedures: NONE   Follow-Up: At [facility], you and your health needs are our priority.  As part of our continuing mission to provide you with exceptional heart care, we have created designated Provider Care Teams.  These Care Teams include your primary Cardiologist (physician) and Advanced Practice Providers (APPs -  Physician Assistants and Nurse Practitioners) who all work together to provide you with the care you need, when you need it.  We recommend signing up for the patient portal called "MyChart".  Sign up information is provided on this After Visit Summary.  MyChart is used to connect with patients for Virtual Visits (Telemedicine).  Patients are able to view lab/test results, encounter notes, upcoming appointments, etc.  Non-urgent messages can be sent to your provider as well.   To learn more about what you can do with MyChart, go to [email or web address].    Your next appointment:   12 month(s) - lipid clinic  The format for your next appointment:   In Person  Provider:   K. Mali Hilty, MD   Other Instructions

## 2020-04-18 NOTE — Progress Notes (Signed)
Virtual Visit via Telephone Note   This visit type was conducted due to national recommendations for restrictions regarding the COVID-19 Pandemic (e.g. social distancing) in an effort to limit this patient's exposure and mitigate transmission in our community.  Due to his co-morbid illnesses, this patient is at least at moderate risk for complications without adequate follow up.  This format is felt to be most appropriate for this patient at this time.  The patient did not have access to video technology/had technical difficulties with video requiring transitioning to audio format only (telephone).  All issues noted in this document were discussed and addressed.  No physical exam could be performed with this format.  Please refer to the patient's chart for his  consent to telehealth for [facility].   Date:  04/18/2020   ID:  Brandon [name], DOB 07-05-1955, MRN [id number] The patient was identified using 2 identifiers.  Evaluation Performed:  Follow-Up Visit  Patient Location:  [address] [place] [address]  Provider location:   [address] [address] [place], [place] [address]  PCP:  [name], MD  Cardiologist:  [name], MD Electrophysiologist:  [name], MD   Chief Complaint:  Follow-up dyslipidemia  History of Present Illness:    Brandon [name] is a 64 y.o. male who presents via audio/video conferencing for a telehealth visit today.  This is a pleasant 64 year old male kindly referred from the heart failure clinic for management of dyslipidemia.  Unfortunately he has suffered an MI with a delayed presentation and evidence of cardiogenic shock.  LVEF is 30 to 35%.  He required intensive care but is made significant improvement.  He is also made major lifestyle changes.  He says he is stop smoking and has been abstinent from alcohol for about 2 months.  He previously drank about 6-8 beers a day, most days during the week.  He said he did not have any  lifestyle limiting issues with alcohol.  He is not clear of his parents cardiac history, but does not clearly have a family history of early onset heart disease.  Recently a lipid profile showed total cholesterol 152, triglycerides 154, HDL 35 and LDL 86.  He is on high potency atorvastatin 80 mg daily.  09/30/2019  Brandon [name] returns today for follow-up of lipids.  He has had a minimal improvement in his numbers with a decrease in triglycerides from 154-117 and a fall and LDL cholesterol from 86-81.  He does report compliance with the addition of his Zetia, however I suspect that his numbers are worse as he has backed off on his alcohol use.  He said he is trying to make some major lifestyle changes and it is clear that alcohol can certainly be associated with lower lipid profiles.  That being said I suspect his cholesterol went up perhaps proportionately more than the benefit of his new medication.  04/18/2020  Brandon [name] is seen today in follow-up.  He continues to make dietary changes.  He is on high potency atorvastatin and ezetimibe but has managed to further decrease his cholesterol with significant dietary adjustments.  Labs 2 weeks ago showed his total cholesterol is down another 10 points to 140, triglycerides 72, HDL 57, improved from 48 and an LDL of 69, down from 81.  Overall very impressive improvement with dietary interventions and lifestyle choices on top of his medical therapy.  I am very proud of the effort that he has made.  The patient does  not have symptoms concerning for COVID-19 infection (fever, chills, cough, or new SHORTNESS OF BREATH).    Prior CV studies:   The following studies were reviewed today:  Chart reviewed  PMHx:  Past Medical History:  Diagnosis Date  . AICD (automatic cardioverter/defibrillator) present 08/31/2019  . Ankylosing spondylitis ([place])   . Arthritis   . BENIGN PROSTATIC HYPERTROPHY 06/07/2008  . CHF (congestive heart failure) ([place])   . COLONIC  POLYPS, HX OF 06/07/2008  . Depression   . H/O hiatal hernia   . Heart failure ([place])   . HYPERLIPIDEMIA 06/07/2008  . HYPERTENSION 06/07/2008  . Myocardial infarction [facility]) 2005   NSTEMI, s/p LAD stent  . NEPHROLITHIASIS, HX OF 06/07/2008  . STEMI (ST elevation myocardial infarction) ([place]) 04/27/2019    Past Surgical History:  Procedure Laterality Date  . CORONARY ANGIOPLASTY WITH STENT PLACEMENT  2005   LAD stent, jailed diagonal  . CORONARY/GRAFT ACUTE MI REVASCULARIZATION N/A 04/27/2019   Procedure: Coronary/Graft Acute MI Revascularization;  Surgeon: [name], MD;  Location: [place] CV LAB;  Service: Cardiovascular;  Laterality: N/A;  . ICD IMPLANT  08/31/2019  . ICD IMPLANT N/A 08/31/2019   Procedure: ICD IMPLANT;  Surgeon: [name], MD;  Location: [place] CV LAB;  Service: Cardiovascular;  Laterality: N/A;  . LAMINECTOMY     lumbar  . LEFT HEART CATH AND CORONARY ANGIOGRAPHY N/A 04/27/2019   Procedure: LEFT HEART CATH AND CORONARY ANGIOGRAPHY;  Surgeon: [name], MD;  Location: [place] CV LAB;  Service: Cardiovascular;  Laterality: N/A;  . LUMBAR FUSION  01/2012  . RIGHT HEART CATH N/A 04/27/2019   Procedure: RIGHT HEART CATH;  Surgeon: [name], Becket M, MD;  Location: [place] CV LAB;  Service: Cardiovascular;  Laterality: N/A;  . TONSILLECTOMY    . warthin tumor removal Left 2014    FAMHx:  Family History  Problem Relation Age of Onset  . Depression Father   . Arthritis Sister        RA  . Heart failure Mother   . Other Neg Hx   . Colon cancer Neg Hx   . Esophageal cancer Neg Hx   . Rectal cancer Neg Hx   . Stomach cancer Neg Hx   . Colon polyps Neg Hx     SOCHx:   reports that he quit smoking about 8 months ago. His smoking use included cigarettes. He has a 20.00 pack-year smoking history. He has never used smokeless tobacco. He reports current alcohol use of about 7.0 standard drinks of alcohol per week. He reports current  drug use.  ALLERGIES:  No Known Allergies  MEDS:  Current Meds  Medication Sig  . acetaminophen (TYLENOL) 325 MG tablet Take 1-2 tablets (325-650 mg total) by mouth every 4 (four) hours as needed for mild pain.  [name] aspirin 81 MG chewable tablet Chew 1 tablet (81 mg total) by mouth daily.  [name] atorvastatin (LIPITOR) 80 MG tablet TAKE 1 TABLET BY MOUTH ONCE DAILY AT 6PM. (Patient taking differently: Take 80 mg by mouth at bedtime. )  . Carboxymethylcellulose Sodium (ARTIFICIAL TEARS OP) Place 1 drop into both eyes every other day.   . carvedilol (COREG) 3.125 MG tablet Take 1 tablet (3.125 mg total) by mouth 2 (two) times daily.  . citalopram (CELEXA) 10 MG tablet Take 0.5 tablets (5 mg total) by mouth daily.  . clopidogrel (PLAVIX) 75 MG tablet take 75 mg by mouth daily. (Patient taking differently: Take 75 mg by  mouth daily. )  . dapagliflozin propanediol (FARXIGA) 10 MG TABS tablet Take 1 tablet (10 mg total) by mouth daily.  . digoxin (LANOXIN) 0.125 MG tablet TAKE 1 TABLET BY MOUTH DAILY (Patient taking differently: Take 0.125 mg by mouth daily. )  . diphenhydrAMINE (BENADRYL) 25 MG tablet Take 25 mg by mouth once a week.   [name] ENTRESTO 24-26 MG TAKE 1 TABLET BY MOUTH 2 (TWO) TIMES DAILY. (Patient taking differently: Take 1 tablet by mouth 2 (two) times daily. )  . eplerenone (INSPRA) 50 MG tablet Take 1 tablet (50 mg total) by mouth daily.  [name] ezetimibe (ZETIA) 10 MG tablet Take 1 tablet (10 mg total) by mouth daily.  . furosemide (LASIX) 20 MG tablet Take 2 tablets (40 mg total) by mouth as needed. (Patient taking differently: Take 40 mg by mouth daily as needed for fluid. )  . inFLIXimab in sodium chloride 0.9 % Inject into the vein every 6 (six) weeks.  . Sodium Sulfate-Mag Sulfate-KCl (SUTAB) [phone] MG TABS Take 1 kit by mouth as directed. MANUFACTURER CODES!! BIN: [id number] PCN: CN GROUP: [id number] MEMBER ID: [id number];TKZ AS CASH;NO PRIOR AUTHORIZATION  . tamsulosin (FLOMAX) 0.4 MG  CAPS capsule TAKE 1 CAPSULE(0.4 MG) BY MOUTH DAILY (Patient taking differently: Take 0.4 mg by mouth daily. )     ROS: Pertinent items noted in HPI and remainder of comprehensive ROS otherwise negative.  Labs/Other Tests and Data Reviewed:    Recent Labs: 04/27/2019: TSH 0.889 04/28/2019: Magnesium 1.9 11/10/2019: B Natriuretic Peptide 186.1 12/23/2019: ALT 19 02/21/2020: Hemoglobin 16.4; Platelets 195 03/23/2020: BUN 20; Creatinine, Ser 0.80; NT-Pro BNP 520; Potassium 4.2; Sodium 137   Recent Lipid Panel Lab Results  Component Value Date/Time   CHOL 140 04/04/2020 08:56 AM   TRIG 72 04/04/2020 08:56 AM   HDL 57 04/04/2020 08:56 AM   CHOLHDL 2.5 04/04/2020 08:56 AM   CHOLHDL 4.3 06/10/2019 10:00 AM   LDLCALC 69 04/04/2020 08:56 AM    Wt Readings from Last 3 Encounters:  04/18/20 184 lb 8 oz (83.7 kg)  03/23/20 184 lb (83.5 kg)  03/15/20 185 lb (83.9 kg)     Exam:    Vital Signs:  BP 111/81   Pulse 86   Ht 6' (1.829 m)   Wt 184 lb 8 oz (83.7 kg)   BMI 25.02 kg/m    Exam not performed due to telephone visit  ASSESSMENT & PLAN:    1. Mixed dyslipidemia, goal LDL less than 70 2. Ischemic cardiomyopathy, EF 30 to 35% 3. Recent acute anterior wall MI 4. Tobacco abuse- quit 5. Etoh use - quit  Brandon [name] has now reached target LDL less than 70 with a combination of medications but more recently significant dietary modifications.  He is really made a commitment to making small changes that have made big differences.  Overall this should help reduce his risk going forward.  We will plan annual follow-up as he sees other cardiologist within the group.  I stressed the importance of continued dietary modifications to maintain his current lipid profile.  COVID-19 Education: The signs and symptoms of COVID-19 were discussed with the patient and how to seek care for testing (follow up with PCP or arrange E-visit).  The importance of social distancing was discussed today.   Patient Risk:   After full review of this patients clinical status, I feel that they are at least moderate risk at this time.  Time:   Today, I have spent 15 minutes with  the patient with telehealth technology discussing dyslipidemia.     Medication Adjustments/Labs and Tests Ordered: Current medicines are reviewed at length with the patient today.  Concerns regarding medicines are outlined above.   Tests Ordered: No orders of the defined types were placed in this encounter.   Medication Changes: No orders of the defined types were placed in this encounter.   Disposition:  in 1 year(s)  Pixie Casino, MD, [facility], [place] Director of the Advanced Lipid Disorders &  Cardiovascular Risk Reduction Clinic Diplomate of the American Board of Clinical Lipidology Attending Cardiologist  Direct Dial: [phone]  Fax: [phone]  Website:  [email or web address]  Pixie Casino, MD  04/18/2020 8:06 AM

## 2020-04-19 ENCOUNTER — Ambulatory Visit (INDEPENDENT_AMBULATORY_CARE_PROVIDER_SITE_OTHER): Payer: Medicare Other

## 2020-04-19 ENCOUNTER — Other Ambulatory Visit: Payer: Self-pay

## 2020-04-19 VITALS — BP 116/62 | HR 91 | Temp 98.7°F | Ht 72.0 in | Wt 187.6 lb

## 2020-04-19 DIAGNOSIS — Z0000 Encounter for general adult medical examination without abnormal findings: Secondary | ICD-10-CM

## 2020-04-19 DIAGNOSIS — Z23 Encounter for immunization: Secondary | ICD-10-CM

## 2020-04-19 NOTE — Patient Instructions (Signed)
[name] , Thank you for taking time to come for your Medicare Wellness Visit. I appreciate your ongoing commitment to your health goals. Please review the following plan we discussed and let me know if I can assist you in the future.   Screening recommendations/referrals: Colonoscopy: Please keep colonoscopy appointment for Monday 04/24/20 Recommended yearly ophthalmology/optometry visit for glaucoma screening and checkup Recommended yearly dental visit for hygiene and checkup  Vaccinations: Influenza vaccine: Up to date, next due fall 2022  Pneumococcal vaccine: Up to date, next due 04/20/2021 Tdap vaccine: Up to date, next due 09/11/2021 Shingles vaccine: Completed series     Advanced directives: Please bring in [place] into our office so that we may scan them into your chart  Conditions/risks identified: None   Next appointment: 04/20/2021 @ 8:15 am with [place] 66 Years and Older, Male Preventive care refers to lifestyle choices and visits with your health care provider that can promote health and wellness. What does preventive care include?  A yearly physical exam. This is also called an annual well check.  Dental exams once or twice a year.  Routine eye exams. Ask your health care provider how often you should have your eyes checked.  Personal lifestyle choices, including:  Daily care of your teeth and gums.  Regular physical activity.  Eating a healthy diet.  Avoiding tobacco and drug use.  Limiting alcohol use.  Practicing safe sex.  Taking low doses of aspirin every day.  Taking vitamin and mineral supplements as recommended by your health care provider. What happens during an annual well check? The services and screenings done by your health care provider during your annual well check will depend on your age, overall health, lifestyle risk factors, and family history of disease. Counseling  Your  health care provider may ask you questions about your:  Alcohol use.  Tobacco use.  Drug use.  Emotional well-being.  Home and relationship well-being.  Sexual activity.  Eating habits.  History of falls.  Memory and ability to understand (cognition).  Work and work Statistician. Screening  You may have the following tests or measurements:  Height, weight, and BMI.  Blood pressure.  Lipid and cholesterol levels. These may be checked every 5 years, or more frequently if you are over 5 years old.  Skin check.  Lung cancer screening. You may have this screening every year starting at age 66 if you have a 30-pack-year history of smoking and currently smoke or have quit within the past 15 years.  Fecal occult blood test (FOBT) of the stool. You may have this test every year starting at age 66.  Flexible sigmoidoscopy or colonoscopy. You may have a sigmoidoscopy every 5 years or a colonoscopy every 10 years starting at age 66.  Prostate cancer screening. Recommendations will vary depending on your family history and other risks.  Hepatitis C blood test.  Hepatitis B blood test.  Sexually transmitted disease (STD) testing.  Diabetes screening. This is done by checking your blood sugar (glucose) after you have not eaten for a while (fasting). You may have this done every 66-66 years.  Abdominal aortic aneurysm (AAA) screening. You may need this if you are a current or former smoker.  Osteoporosis. You may be screened starting at age 66 if you are at high risk. Talk with your health care provider about your test results, treatment options, and if necessary, the need for more tests. Vaccines  Your health  care provider may recommend certain vaccines, such as:  Influenza vaccine. This is recommended every year.  Tetanus, diphtheria, and acellular pertussis (Tdap, Td) vaccine. You may need a Td booster every 10 years.  Zoster vaccine. You may need this after age  66.  Pneumococcal 13-valent conjugate (PCV13) vaccine. One dose is recommended after age 66.  Pneumococcal polysaccharide (PPSV23) vaccine. One dose is recommended after age 66. Talk to your health care provider about which screenings and vaccines you need and how often you need them. This information is not intended to replace advice given to you by your health care provider. Make sure you discuss any questions you have with your health care provider. Document Released: 05/26/2015 Document Revised: 01/17/2016 Document Reviewed: 02/28/2015 Elsevier Interactive Patient Education  2017 [place] Prevention in the Home Falls can cause injuries. They can happen to people of all ages. There are many things you can do to make your home safe and to help prevent falls. What can I do on the outside of my home?  Regularly fix the edges of walkways and driveways and fix any cracks.  Remove anything that might make you trip as you walk through a door, such as a raised step or threshold.  Trim any bushes or trees on the path to your home.  Use bright outdoor lighting.  Clear any walking paths of anything that might make someone trip, such as rocks or tools.  Regularly check to see if handrails are loose or broken. Make sure that both sides of any steps have handrails.  Any raised decks and porches should have guardrails on the edges.  Have any leaves, snow, or ice cleared regularly.  Use sand or salt on walking paths during winter.  Clean up any spills in your garage right away. This includes oil or grease spills. What can I do in the bathroom?  Use night lights.  Install grab bars by the toilet and in the tub and shower. Do not use towel bars as grab bars.  Use non-skid mats or decals in the tub or shower.  If you need to sit down in the shower, use a plastic, non-slip stool.  Keep the floor dry. Clean up any water that spills on the floor as soon as it happens.  Remove  soap buildup in the tub or shower regularly.  Attach bath mats securely with double-sided non-slip rug tape.  Do not have throw rugs and other things on the floor that can make you trip. What can I do in the bedroom?  Use night lights.  Make sure that you have a light by your bed that is easy to reach.  Do not use any sheets or blankets that are too big for your bed. They should not hang down onto the floor.  Have a firm chair that has side arms. You can use this for support while you get dressed.  Do not have throw rugs and other things on the floor that can make you trip. What can I do in the kitchen?  Clean up any spills right away.  Avoid walking on wet floors.  Keep items that you use a lot in easy-to-reach places.  If you need to reach something above you, use a strong step stool that has a grab bar.  Keep electrical cords out of the way.  Do not use floor polish or wax that makes floors slippery. If you must use wax, use non-skid floor wax.  Do not have  throw rugs and other things on the floor that can make you trip. What can I do with my stairs?  Do not leave any items on the stairs.  Make sure that there are handrails on both sides of the stairs and use them. Fix handrails that are broken or loose. Make sure that handrails are as long as the stairways.  Check any carpeting to make sure that it is firmly attached to the stairs. Fix any carpet that is loose or worn.  Avoid having throw rugs at the top or bottom of the stairs. If you do have throw rugs, attach them to the floor with carpet tape.  Make sure that you have a light switch at the top of the stairs and the bottom of the stairs. If you do not have them, ask someone to add them for you. What else can I do to help prevent falls?  Wear shoes that:  Do not have high heels.  Have rubber bottoms.  Are comfortable and fit you well.  Are closed at the toe. Do not wear sandals.  If you use a  stepladder:  Make sure that it is fully opened. Do not climb a closed stepladder.  Make sure that both sides of the stepladder are locked into place.  Ask someone to hold it for you, if possible.  Clearly mark and make sure that you can see:  Any grab bars or handrails.  First and last steps.  Where the edge of each step is.  Use tools that help you move around (mobility aids) if they are needed. These include:  Canes.  Walkers.  Scooters.  Crutches.  Turn on the lights when you go into a dark area. Replace any light bulbs as soon as they burn out.  Set up your furniture so you have a clear path. Avoid moving your furniture around.  If any of your floors are uneven, fix them.  If there are any pets around you, be aware of where they are.  Review your medicines with your doctor. Some medicines can make you feel dizzy. This can increase your chance of falling. Ask your doctor what other things that you can do to help prevent falls. This information is not intended to replace advice given to you by your health care provider. Make sure you discuss any questions you have with your health care provider. Document Released: 02/23/2009 Document Revised: 10/05/2015 Document Reviewed: 06/03/2014 Elsevier Interactive Patient Education  2017 [employer].

## 2020-04-20 ENCOUNTER — Other Ambulatory Visit (HOSPITAL_COMMUNITY)
Admission: RE | Admit: 2020-04-20 | Discharge: 2020-04-20 | Disposition: A | Discharge status: Home / Self Care | Payer: Medicare Other | Source: Ambulatory Visit | Attending: Gastroenterology | Admitting: Gastroenterology

## 2020-04-20 DIAGNOSIS — Z01812 Encounter for preprocedural laboratory examination: Secondary | ICD-10-CM | POA: Insufficient documentation

## 2020-04-20 DIAGNOSIS — Z20822 Contact with and (suspected) exposure to covid-19: Secondary | ICD-10-CM | POA: Insufficient documentation

## 2020-04-20 LAB — SARS CORONAVIRUS 2 (TAT 6-24 HRS): SARS Coronavirus 2: NEGATIVE

## 2020-04-24 ENCOUNTER — Encounter (HOSPITAL_COMMUNITY)
Admission: RE | Disposition: A | Discharge status: Home / Self Care | Payer: Self-pay | Source: Home / Self Care | Attending: Gastroenterology

## 2020-04-24 ENCOUNTER — Other Ambulatory Visit: Payer: Self-pay

## 2020-04-24 ENCOUNTER — Ambulatory Visit (HOSPITAL_COMMUNITY): Payer: Medicare Other | Admitting: Registered Nurse

## 2020-04-24 ENCOUNTER — Ambulatory Visit (HOSPITAL_COMMUNITY)
Admission: RE | Admit: 2020-04-24 | Discharge: 2020-04-24 | Disposition: A | Discharge status: Home / Self Care | Payer: Medicare Other | Attending: Gastroenterology | Admitting: Gastroenterology

## 2020-04-24 DIAGNOSIS — D120 Benign neoplasm of cecum: Secondary | ICD-10-CM | POA: Insufficient documentation

## 2020-04-24 DIAGNOSIS — D122 Benign neoplasm of ascending colon: Secondary | ICD-10-CM | POA: Diagnosis not present

## 2020-04-24 DIAGNOSIS — Z95810 Presence of automatic (implantable) cardiac defibrillator: Secondary | ICD-10-CM | POA: Insufficient documentation

## 2020-04-24 DIAGNOSIS — K648 Other hemorrhoids: Secondary | ICD-10-CM | POA: Diagnosis not present

## 2020-04-24 DIAGNOSIS — D123 Benign neoplasm of transverse colon: Secondary | ICD-10-CM | POA: Insufficient documentation

## 2020-04-24 DIAGNOSIS — D124 Benign neoplasm of descending colon: Secondary | ICD-10-CM | POA: Diagnosis not present

## 2020-04-24 DIAGNOSIS — Z87442 Personal history of urinary calculi: Secondary | ICD-10-CM | POA: Insufficient documentation

## 2020-04-24 DIAGNOSIS — Z860100 Personal history of colon polyps, unspecified: Secondary | ICD-10-CM

## 2020-04-24 DIAGNOSIS — Z09 Encounter for follow-up examination after completed treatment for conditions other than malignant neoplasm: Secondary | ICD-10-CM | POA: Diagnosis present

## 2020-04-24 DIAGNOSIS — K5730 Diverticulosis of large intestine without perforation or abscess without bleeding: Secondary | ICD-10-CM | POA: Insufficient documentation

## 2020-04-24 DIAGNOSIS — Z955 Presence of coronary angioplasty implant and graft: Secondary | ICD-10-CM | POA: Diagnosis not present

## 2020-04-24 DIAGNOSIS — Z87891 Personal history of nicotine dependence: Secondary | ICD-10-CM | POA: Diagnosis not present

## 2020-04-24 DIAGNOSIS — D126 Benign neoplasm of colon, unspecified: Secondary | ICD-10-CM

## 2020-04-24 HISTORY — PX: POLYPECTOMY: SHX5525

## 2020-04-24 HISTORY — PX: COLONOSCOPY WITH PROPOFOL: SHX5780

## 2020-04-24 SURGERY — COLONOSCOPY WITH PROPOFOL
Anesthesia: Monitor Anesthesia Care

## 2020-04-24 MED ORDER — LACTATED RINGERS IV SOLN: Freq: Once | INTRAVENOUS | Status: AC

## 2020-04-24 MED ORDER — PHENYLEPHRINE 40 MCG/ML (10ML) SYRINGE FOR IV PUSH (FOR BLOOD PRESSURE SUPPORT)
PREFILLED_SYRINGE | INTRAVENOUS | Status: DC | PRN
  Administered 2020-04-24 (×2): 80 ug via INTRAVENOUS

## 2020-04-24 MED ORDER — PROPOFOL 1000 MG/100ML IV EMUL
INTRAVENOUS | Status: AC
  Filled 2020-04-24: qty 100

## 2020-04-24 MED ORDER — PROPOFOL 500 MG/50ML IV EMUL
INTRAVENOUS | Status: DC | PRN
  Administered 2020-04-24: 140 ug/kg/min via INTRAVENOUS
  Administered 2020-04-24: 20 mg via INTRAVENOUS

## 2020-04-24 MED ORDER — SODIUM CHLORIDE 0.9 % IV SOLN: INTRAVENOUS | Status: DC

## 2020-04-24 SURGICAL SUPPLY — 21 items

## 2020-04-24 NOTE — Anesthesia Postprocedure Evaluation (Signed)
Anesthesia Post Note  Patient: Brandon [name]  Procedure(s) Performed: COLONOSCOPY WITH PROPOFOL (N/A ) POLYPECTOMY     Patient location during evaluation: Endoscopy Anesthesia Type: MAC Level of consciousness: awake and alert Pain management: pain level controlled Vital Signs Assessment: post-procedure vital signs reviewed and stable Respiratory status: spontaneous breathing, nonlabored ventilation and respiratory function stable Cardiovascular status: blood pressure returned to baseline and stable Postop Assessment: no apparent nausea or vomiting Anesthetic complications: no   No complications documented.  Last Vitals:  Vitals:   04/24/20 0840 04/24/20 0850  BP: 109/68 111/71  Pulse: 80 79  Resp: 11 17  Temp:    SpO2: 99% 98%    Last Pain:  Vitals:   04/24/20 0850  TempSrc:   PainSc: 0-No pain                 Lidia Collum

## 2020-04-24 NOTE — Transfer of Care (Signed)
Immediate Anesthesia Transfer of Care Note  Patient: Brandon [name]  Procedure(s) Performed: COLONOSCOPY WITH PROPOFOL (N/A ) POLYPECTOMY  Patient Location: PACU and Endoscopy Unit  Anesthesia Type:MAC  Level of Consciousness: awake, alert , oriented and patient cooperative  Airway & Oxygen Therapy: Patient Spontanous Breathing and Patient connected to face mask oxygen  Post-op Assessment: Report given to RN, Post -op Vital signs reviewed and stable and Patient moving all extremities  Post vital signs: Reviewed and stable  Last Vitals:  Vitals Value Taken Time  BP    Temp    Pulse 92 04/24/20 0829  Resp 20 04/24/20 0829  SpO2 100 % 04/24/20 0829  Vitals shown include unvalidated device data.  Last Pain:  Vitals:   04/24/20 0732  TempSrc: Oral  PainSc: 0-No pain         Complications: No complications documented.

## 2020-04-24 NOTE — Anesthesia Preprocedure Evaluation (Signed)
Anesthesia Evaluation  Patient identified by MRN, date of birth, ID band Patient awake    Reviewed: Allergy & Precautions, NPO status , Patient's Chart, lab work & pertinent test results  History of Anesthesia Complications Negative for: history of anesthetic complications  Airway Mallampati: II  TM Distance: >3 FB Neck ROM: Full    Dental   Pulmonary neg pulmonary ROS, former smoker,    Pulmonary exam normal        Cardiovascular hypertension, + CAD, + Past MI (04/2019), + Cardiac Stents (04/2019) and +CHF  Normal cardiovascular exam+ Cardiac Defibrillator + Valvular Problems/Murmurs (MG 9.7; AVA 1.49) AS      Neuro/Psych negative neurological ROS  negative psych ROS   GI/Hepatic Neg liver ROS, hiatal hernia,   Endo/Other  negative endocrine ROS  Renal/GU negative Renal ROS  negative genitourinary   Musculoskeletal  (+) Arthritis  (ankylosing spondylitis),   Abdominal   Peds  Hematology negative hematology ROS (+)   Anesthesia Other Findings  Echo 10/29/19:  1. Basal function most preserved. Septal apical and inferior wall akinesis . Left ventricular ejection fraction, by estimation, is 25 to 30%. The left ventricle has severely decreased function. The left ventricle demonstrates regional wall motion abnormalities (see scoring diagram/findings for description). The left ventricular internal cavity size was moderately dilated. There is moderate asymmetric left ventricular hypertrophy of the basal and septal segments. Left ventricular diastolic parameters are consistent with Grade I diastolic dysfunction (impaired relaxation). Elevated left ventricular end-diastolic pressure. 2. AICD catheter in RV/RA. Right ventricular systolic function is normal. The right ventricular size is normal. 3. The mitral valve is abnormal. Trivial mitral valve regurgitation. 4. Lower gradient moderate AS due to low EF DVI 0.39 . The aortic  valve was not well visualized. Aortic valve regurgitation is not visualized. Moderate aortic valve stenosis.  Reproductive/Obstetrics                             Anesthesia Physical Anesthesia Plan  ASA: IV  Anesthesia Plan: MAC   Post-op Pain Management:    Induction: Intravenous  PONV Risk Score and Plan: 1 and Propofol infusion, TIVA and Treatment may vary due to age or medical condition  Airway Management Planned: Natural Airway, Nasal Cannula and Simple Face Mask  Additional Equipment: None  Intra-op Plan:   Post-operative Plan:   Informed Consent: I have reviewed the patients History and Physical, chart, labs and discussed the procedure including the risks, benefits and alternatives for the proposed anesthesia with the patient or authorized representative who has indicated his/her understanding and acceptance.       Plan Discussed with:   Anesthesia Plan Comments:         Anesthesia Quick Evaluation

## 2020-04-24 NOTE — Interval H&P Note (Signed)
History and Physical Interval Note:  04/24/2020 7:25 AM  Brandon [name]  has presented today for surgery, with the diagnosis of surveillance colonoscopy, history of colon polyps.  The various methods of treatment have been discussed with the patient and family. After consideration of risks, benefits and other options for treatment, the patient has consented to  Procedure(s): COLONOSCOPY WITH PROPOFOL (N/A) as a surgical intervention.  The patient's history has been reviewed, patient examined, no change in status, stable for surgery.  I have reviewed the patient's chart and labs.  Questions were answered to the patient's satisfaction.     [place]

## 2020-04-24 NOTE — Discharge Instructions (Signed)

## 2020-04-24 NOTE — H&P (Signed)
HPI :  64 y/o male with history of CHF, AICD in place, history of CAD on Plavix, here for surveillance colonoscopy. History of advanced polyps removed in 2015, overdue for surveillance. Case being done at the hospital in light of EF and medical problems. He otherwise feels well today without complaints.  Past Medical History:  Diagnosis Date  . AICD (automatic cardioverter/defibrillator) present 08/31/2019  . Ankylosing spondylitis ([place])   . Arthritis   . BENIGN PROSTATIC HYPERTROPHY 06/07/2008  . CHF (congestive heart failure) ([place])   . COLONIC POLYPS, HX OF 06/07/2008  . Depression   . H/O hiatal hernia   . Heart failure ([place])   . HYPERLIPIDEMIA 06/07/2008  . HYPERTENSION 06/07/2008  . Myocardial infarction [facility]) 2005   NSTEMI, s/p LAD stent  . NEPHROLITHIASIS, HX OF 06/07/2008  . STEMI (ST elevation myocardial infarction) ([place]) 04/27/2019     Past Surgical History:  Procedure Laterality Date  . CORONARY ANGIOPLASTY WITH STENT PLACEMENT  2005   LAD stent, jailed diagonal  . CORONARY/GRAFT ACUTE MI REVASCULARIZATION N/A 04/27/2019   Procedure: Coronary/Graft Acute MI Revascularization;  Surgeon: [name], MD;  Location: [place] [facility];  Service: Cardiovascular;  Laterality: N/A;  . ICD IMPLANT  08/31/2019  . ICD IMPLANT N/A 08/31/2019   Procedure: ICD IMPLANT;  Surgeon: [name], MD;  Location: [place] [facility];  Service: Cardiovascular;  Laterality: N/A;  . LAMINECTOMY     lumbar  . LEFT HEART CATH AND CORONARY ANGIOGRAPHY N/A 04/27/2019   Procedure: LEFT HEART CATH AND CORONARY ANGIOGRAPHY;  Surgeon: [name], MD;  Location: [place] [facility];  Service: Cardiovascular;  Laterality: N/A;  . LUMBAR FUSION  01/2012  . RIGHT HEART CATH N/A 04/27/2019   Procedure: RIGHT HEART CATH;  Surgeon: [name], Keoki M, MD;  Location: [place] [facility];  Service: Cardiovascular;  Laterality: N/A;  . TONSILLECTOMY    . warthin tumor removal Left 2014    Family History  Problem Relation Age of Onset  . Depression Father   . Arthritis Sister        RA  . Heart failure Mother   . Other Neg Hx   . Colon cancer Neg Hx   . Esophageal cancer Neg Hx   . Rectal cancer Neg Hx   . Stomach cancer Neg Hx   . Colon polyps Neg Hx    Social History   Tobacco Use  . Smoking status: Former Smoker    Packs/day: 0.50    Years: 40.00    Pack years: 20.00    Types: Cigarettes    Quit date: 07/26/2019    Years since quitting: 0.7  . Smokeless tobacco: Never Used  Vaping Use  . Vaping Use: Never used  Substance Use Topics  . Alcohol use: Yes    Alcohol/week: 7.0 standard drinks    Types: 7 Shots of liquor per week    Comment: "2 vodka tonics a night"  . Drug use: Yes    Comment: occasionally - CBD oil   No current facility-administered medications for this encounter.   No Known Allergies   Review of Systems: All systems reviewed and negative except where noted in HPI.    Lab Results  Component Value Date   WBC 7.8 02/21/2020   HGB 16.4 02/21/2020   HCT 50.9 02/21/2020   MCV 89 02/21/2020   PLT 195 02/21/2020    Lab Results  Component Value Date   CREATININE 0.80 03/23/2020  BUN 20 03/23/2020   NA 137 03/23/2020   K 4.2 03/23/2020   CL 101 03/23/2020   CO2 24 03/23/2020    Lab Results  Component Value Date   ALT 19 12/23/2019   AST 17 12/23/2019   ALKPHOS 80 12/23/2019   BILITOT 1.1 12/23/2019    Physical Exam: Constitutional: Pleasant,well-developed, male in no acute distress.  Cardiovascular: Normal rate, regular rhythm.  Pulmonary/chest: Effort normal and breath sounds normal.  Abdominal: Soft, nondistended, nontender.   ASSESSMENT AND PLAN: 64 y/o male here for surveillance colonoscopy for history of advanced polyps removed in the past. History of CHF and CAD, case done at the hospital for anesthesia support. Last dose of Plavix was 5 days ago. I have discussed risks / benefits of colonoscopy and  anesthesia with the patient and he wishes to proceed, further recommendations pending the results. He agrees.   [place] Cellar, MD [facility]

## 2020-04-24 NOTE — Op Note (Addendum)
[facility] Patient Name: Brandon [name] Procedure Date: 04/24/2020 MRN: [id number] Attending MD: Carlota Raspberry. Havery Moros , MD Date of Birth: 1956/04/18 CSN: [id number] Age: 64 Admit Type: Outpatient Procedure:                Colonoscopy Indications:              High risk colon cancer surveillance: Personal                            history of colonic polyps (advanced sessile                            serrated polyp removed in 2015) Providers:                Carlota Raspberry. Havery Moros, MD, Glori Bickers, RN, Lesia Sago, Technician, Courtney Heys [place], CRNA Referring MD:              Medicines:                Monitored Anesthesia Care Complications:            No immediate complications. Estimated blood loss:                            Minimal. Estimated Blood Loss:     Estimated blood loss was minimal. Procedure:                Pre-Anesthesia Assessment:                           - Prior to the procedure, a History and Physical                            was performed, and patient medications and                            allergies were reviewed. The patient's tolerance of                            previous anesthesia was also reviewed. The risks                            and benefits of the procedure and the sedation                            options and risks were discussed with the patient.                            All questions were answered, and informed consent                            was obtained. Prior Anticoagulants: The patient has  taken Plavix (clopidogrel), last dose was 5 days                            prior to procedure. ASA Grade Assessment: III - A                            patient with severe systemic disease. After                            reviewing the risks and benefits, the patient was                            deemed in satisfactory condition to undergo the                             procedure.                           After obtaining informed consent, the colonoscope                            was passed under direct vision. Throughout the                            procedure, the patient's blood pressure, pulse, and                            oxygen saturations were monitored continuously. The                            PCF-H190DL ([id number] Olympus pediatric colonscope                            was introduced through the anus and advanced to the                            the cecum, identified by appendiceal orifice and                            ileocecal valve. The colonoscopy was performed                            without difficulty. The patient tolerated the                            procedure well. The quality of the bowel                            preparation was adequate. The ileocecal valve,                            appendiceal orifice, and rectum were photographed. Scope In: 7:45:25 AM Scope Out: 8:21:10 AM Scope Withdrawal Time: 0 hours 31 minutes 23 seconds  Total Procedure Duration: 0 hours 35  minutes 45 seconds  Findings:      The perianal and digital rectal examinations were normal.      A 8 mm polyp was found in the cecum. The polyp was sessile. The polyp       was removed with a cold snare. Resection and retrieval were complete.      Two sessile polyps were found in the ascending colon. The polyps were 3       to 6 mm in size. These polyps were removed with a cold snare. Resection       and retrieval were complete.      A roughly 15 mm polyp was found in the transverse colon. The polyp was       sessile. The polyp was removed with a cold snare. Resection and       retrieval were complete.      A few small-mouthed diverticula were found in the sigmoid colon.      Internal hemorrhoids were found during retroflexion. The hemorrhoids       were small.      The colon was tortous, with poor air retention which led to a prolonged       exam. The  exam was otherwise without abnormality. Impression:               - One 8 mm polyp in the cecum, removed with a cold                            snare. Resected and retrieved.                           - Two 3 to 6 mm polyps in the ascending colon,                            removed with a cold snare. Resected and retrieved.                           - One 15 mm polyp in the transverse colon, removed                            with a cold snare. Resected and retrieved.                           - Diverticulosis in the sigmoid colon.                           - Internal hemorrhoids.                           - The examination was otherwise normal. Moderate Sedation:      No moderate sedation, case performed with MAC Recommendation:           - Patient has a contact number available for                            emergencies. The signs and symptoms of potential  delayed complications were discussed with the                            patient. Return to normal activities tomorrow.                            Written discharge instructions were provided to the                            patient.                           - Resume previous diet.                           - Continue present medications.                           - Resume Plavix tomorrow                           - Await pathology results. Procedure Code(s):        --- Professional ---                           [phone], Colonoscopy, flexible; with removal of                            tumor(s), polyp(s), or other lesion(s) by snare                            technique Diagnosis Code(s):        --- Professional ---                           K63.5, Polyp of colon                           Z86.010, Personal history of colonic polyps                           K64.8, Other hemorrhoids                           K57.30, Diverticulosis of large intestine without                            perforation or abscess  without bleeding CPT copyright 2019 American Medical Association. All rights reserved. The codes documented in this report are preliminary and upon coder review may  be revised to meet current compliance requirements. Remo Lipps P. Danna Sewell, MD 04/24/2020 8:27:33 AM This report has been signed electronically. Number of Addenda: 0

## 2020-04-25 ENCOUNTER — Encounter (HOSPITAL_COMMUNITY): Payer: Self-pay | Admitting: Gastroenterology

## 2020-04-25 LAB — SURGICAL PATHOLOGY

## 2020-04-25 NOTE — Telephone Encounter (Signed)
Sent in [employer] application via fax. Will follow up.  Advanced Heart Failure Patient Advocate Encounter   Patient was approved to receive Farxiga from [employer]  Effective dates: 05/13/20 through 05/12/21

## 2020-04-27 ENCOUNTER — Encounter: Payer: Self-pay | Admitting: Family Medicine

## 2020-05-03 ENCOUNTER — Encounter (HOSPITAL_COMMUNITY): Payer: Self-pay | Admitting: Internal Medicine

## 2020-05-03 ENCOUNTER — Other Ambulatory Visit: Payer: Self-pay

## 2020-05-03 ENCOUNTER — Ambulatory Visit (HOSPITAL_COMMUNITY)
Admission: RE | Admit: 2020-05-03 | Discharge: 2020-05-03 | Disposition: A | Discharge status: Home / Self Care | Payer: Medicare Other | Source: Ambulatory Visit | Attending: Internal Medicine | Admitting: Internal Medicine

## 2020-05-03 VITALS — BP 104/60 | HR 83 | Wt 188.4 lb

## 2020-05-03 DIAGNOSIS — E785 Hyperlipidemia, unspecified: Secondary | ICD-10-CM | POA: Insufficient documentation

## 2020-05-03 DIAGNOSIS — Z79899 Other long term (current) drug therapy: Secondary | ICD-10-CM | POA: Diagnosis not present

## 2020-05-03 DIAGNOSIS — I2510 Atherosclerotic heart disease of native coronary artery without angina pectoris: Secondary | ICD-10-CM

## 2020-05-03 DIAGNOSIS — I110 Hypertensive heart disease with heart failure: Secondary | ICD-10-CM | POA: Diagnosis not present

## 2020-05-03 DIAGNOSIS — Z7902 Long term (current) use of antithrombotics/antiplatelets: Secondary | ICD-10-CM | POA: Insufficient documentation

## 2020-05-03 DIAGNOSIS — I5022 Chronic systolic (congestive) heart failure: Secondary | ICD-10-CM | POA: Insufficient documentation

## 2020-05-03 DIAGNOSIS — Z95810 Presence of automatic (implantable) cardiac defibrillator: Secondary | ICD-10-CM | POA: Insufficient documentation

## 2020-05-03 DIAGNOSIS — I252 Old myocardial infarction: Secondary | ICD-10-CM | POA: Insufficient documentation

## 2020-05-03 DIAGNOSIS — I255 Ischemic cardiomyopathy: Secondary | ICD-10-CM | POA: Diagnosis not present

## 2020-05-03 DIAGNOSIS — R0602 Shortness of breath: Secondary | ICD-10-CM | POA: Diagnosis present

## 2020-05-03 DIAGNOSIS — I350 Nonrheumatic aortic (valve) stenosis: Secondary | ICD-10-CM | POA: Insufficient documentation

## 2020-05-03 DIAGNOSIS — Z7982 Long term (current) use of aspirin: Secondary | ICD-10-CM | POA: Diagnosis not present

## 2020-05-03 DIAGNOSIS — Z87891 Personal history of nicotine dependence: Secondary | ICD-10-CM | POA: Diagnosis not present

## 2020-05-03 MED ORDER — CARVEDILOL 6.25 MG PO TABS: 6.2500 mg | ORAL_TABLET | Freq: Two times a day (BID) | ORAL | 6 refills | Status: DC

## 2020-05-03 NOTE — Progress Notes (Signed)
Error

## 2020-05-03 NOTE — Progress Notes (Signed)
Advanced Heart Failure Clinic Note   Referring Physician: PCP: [name], MD PCP-Cardiologist: [name], MD  [facility]: Dr. [name]   HPI: 64 y/o smoker (originally from [place], [place]) with h/o CAD s/p previous MI, HTN, HL.  He was admitted 04/27/19 with several days of stuttering CP. Initial ECG showed anterior ST elevation. HsTrop 476 -> >27k. Taken to cath lab which showed chronically occluded RCA (L>>R collaterals) and thrombotic occlusion of proximal LAD. Underwent PCI of LAD. Echo w/ reduced LVEF 30-35% w/ apical aneurysm. RV ok. Post cath, he required milrinone for cardiogenic shock. Diuresed w/ IV Lasix. Milrinone ultimately weaned off   Returns to clinic today for f/u. Now s/p Barostim.He feels good. Has really improved his diet. Sees in [facility]. LDL < 70. Walking without CP. Minimal SOB at time. No edema, orthopnea or PND. No problems with meds. No dizziness. Has not taken lasix in over a month.   ECHO 10/29/19 EF 25-30% RV ok. Personally reviewed   ECHO 04/27/19 EF 30-35% RV normal.  Moderate HK LV. Grade IDD.   Past Medical History:  Diagnosis Date  . AICD (automatic cardioverter/defibrillator) present 08/31/2019  . Ankylosing spondylitis ([place])   . Arthritis   . BENIGN PROSTATIC HYPERTROPHY 06/07/2008  . CHF (congestive heart failure) ([place])   . COLONIC POLYPS, HX OF 06/07/2008  . Depression   . H/O hiatal hernia   . Heart failure ([place])   . HYPERLIPIDEMIA 06/07/2008  . HYPERTENSION 06/07/2008  . Myocardial infarction [facility]) 2005   NSTEMI, s/p LAD stent  . NEPHROLITHIASIS, HX OF 06/07/2008  . STEMI (ST elevation myocardial infarction) ([place]) 04/27/2019    Current Outpatient Medications  Medication Sig Dispense Refill  . acetaminophen (TYLENOL) 325 MG tablet Take 1-2 tablets (325-650 mg total) by mouth every 4 (four) hours as needed for mild pain.    [name] aspirin 81 MG chewable tablet Chew 1 tablet (81 mg total) by mouth daily. 30 tablet 3  . atorvastatin  (LIPITOR) 80 MG tablet TAKE 1 TABLET BY MOUTH ONCE DAILY AT 6PM. 90 tablet 3  . Carboxymethylcellulose Sodium (ARTIFICIAL TEARS OP) Place 1 drop into both eyes every other day.    . carvedilol (COREG) 3.125 MG tablet Take 1 tablet (3.125 mg total) by mouth 2 (two) times daily. 180 tablet 3  . citalopram (CELEXA) 10 MG tablet Take 0.5 tablets (5 mg total) by mouth daily. 90 tablet 1  . clopidogrel (PLAVIX) 75 MG tablet take 75 mg by mouth daily. 90 tablet 3  . dapagliflozin propanediol (FARXIGA) 10 MG TABS tablet Take 1 tablet (10 mg total) by mouth daily. 90 tablet 11  . digoxin (LANOXIN) 0.125 MG tablet TAKE 1 TABLET BY MOUTH DAILY 90 tablet 3  . diphenhydrAMINE (BENADRYL) 25 MG tablet Take 25 mg by mouth once a week.    [name] ENTRESTO 24-26 MG TAKE 1 TABLET BY MOUTH 2 (TWO) TIMES DAILY. 180 tablet 3  . eplerenone (INSPRA) 50 MG tablet Take 1 tablet (50 mg total) by mouth daily. 90 tablet 3  . ezetimibe (ZETIA) 10 MG tablet Take 1 tablet (10 mg total) by mouth daily. 90 tablet 3  . inFLIXimab in sodium chloride 0.9 % Inject into the vein every 6 (six) weeks.    . tamsulosin (FLOMAX) 0.4 MG CAPS capsule TAKE 1 CAPSULE(0.4 MG) BY MOUTH DAILY 90 capsule 1  . furosemide (LASIX) 20 MG tablet Take 2 tablets (40 mg total) by mouth as needed. 180 tablet 0   No current  facility-administered medications for this encounter.    No Known Allergies    Social History   Socioeconomic History  . Marital status: Single    Spouse name: Not on file  . Number of children: Not on file  . Years of education: Not on file  . Highest education level: Not on file  Occupational History  . 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 Smoking status: Former Smoker    Packs/day: 0.50    Years: 40.00    Pack years: 20.00    Types: Cigarettes    Quit date: 07/26/2019    Years since quitting: 0.7  . Smokeless tobacco: Never Used  Vaping Use  . Vaping Use: Never used  Substance and Sexual  Activity  . Alcohol use: Yes    Alcohol/week: 7.0 standard drinks    Types: 7 Shots of liquor per week    Comment: "2 vodka tonics a night"  . Drug use: Yes    Comment: occasionally - CBD oil  . Sexual activity: Not on file  Other Topics Concern  . Not on file  Social History Narrative   Lives in [place] alone   Works part time for a [employer] in [place] Strain: [place]   . Difficulty of Paying Living Expenses: Not hard at all  Food Insecurity: No Food Insecurity  . Worried About Charity fundraiser in the Last Year: Never true  . Ran Out of Food in the Last Year: Never true  Transportation Needs: No Transportation Needs  . Lack of Transportation (Medical): No  . Lack of Transportation (Non-Medical): No  Physical Activity: Sufficiently Active  . Days of Exercise per Week: 5 days  . Minutes of Exercise per Session: 40 min  Stress: No Stress Concern Present  . Feeling of Stress : Not at all  Social Connections: Socially Isolated  . Frequency of Communication with Friends and Family: More than three times a week  . Frequency of Social Gatherings with Friends and Family: More than three times a week  . Attends Religious Services: Never  . Active Member of Clubs or Organizations: No  . Attends Archivist Meetings: Never  . Marital Status: Never married  Intimate Partner Violence: Not At Risk  . Fear of Current or Ex-Partner: No  . Emotionally Abused: No  . Physically Abused: No  . Sexually Abused: No      Family History  Problem Relation Age of Onset  . Depression Father   . Arthritis Sister        RA  . Heart failure Mother   . Other Neg Hx   . Colon cancer Neg Hx   . Esophageal cancer Neg Hx   . Rectal cancer Neg Hx   . Stomach cancer Neg Hx   . Colon polyps Neg Hx     Vitals:   05/03/20 1110  BP: 104/60  Pulse: 83  SpO2: 97%  Weight: 85.5 kg (188 lb 6.4 oz)   Wt  Readings from Last 3 Encounters:  05/03/20 85.5 kg (188 lb 6.4 oz)  04/24/20 84.4 kg (186 lb)  04/19/20 85.1 kg (187 lb 9 oz)   PHYSICAL EXAM: General:  Well appearing. No resp difficulty HEENT: normal Neck: supple. no JVD. Carotids 2+ bilat; no bruits. No lymphadenopathy or thryomegaly appreciated. Cor: PMI nondisplaced. Regular rate & rhythm. 2/6 AS  Lungs: clear Abdomen: soft,  nontender, nondistended. No hepatosplenomegaly. No bruits or masses. Good bowel sounds. Extremities: no cyanosis, clubbing, rash, edema Neuro: alert & orientedx3, cranial nerves grossly intact. moves all 4 extremities w/o difficulty. Affect pleasant   ASSESSMENT & PLAN:  1.  Chronic systolic heart failure/ischemic cardiomyopathy:  - 04/27/19 ECHO- EF post anterior MI 30 to 35%. - Echo 6/21 EF 25-30% RV ok Personally reviewed - s/p ICD - Now s/p Barostim. - Doing well. NYHA II. Volume status stable. Continue lasix PRN.   -Continue eplerenone 50 mg daily -Can stop digoxin - Increase carvedilol to 6.25 mg twice a day -Continue Entresto 24-26 mg twice a day. BP too low to titrate - Continue [place] - Recent labs ok    2. CAD: s/p anterior STEMI 12/20. LHC showed chronically occluded RCA (with L>>R collaterals) and thrombotic occlusion of proximal LAD. Underwent PCI of LAD. - No s/s angina - continue DAPT w/ ASA + Effient for a minimum of 12 months - continue high intensity statin + Zetia. LDL goal < 70 (69 mg/dL 11/21). Follows with Dr. Debara Pickett - Continue Coreg 3.125 mg bid  3.  Tobacco abuse: Former smoker.  - remains abstinent  4.  Hyperlipidemia: Recent LDL 81 mg/dL.  Goal in the setting of known coronary disease and recent myocardial infarction is less than 70 mg/dL.  Reports full compliance with atorvastatin 80 mg and tolerating well without side effects. -now followed by Dr. Debara Pickett in [facility] and 10 mg Zetia added. Consider PCSK9i  5. ETOH  - reports he has quit drinking  6. Aortic  Stenosis -Mild-mod on ECHO. Repeat echo.    Glori Bickers, MD 05/03/20

## 2020-05-03 NOTE — Patient Instructions (Addendum)
Increase Carvedilol to 6.25 mg Twice daily   STOP Digoxin  Your physician has requested that you have an echocardiogram. Echocardiography is a painless test that uses sound waves to create images of your heart. It provides your doctor with information about the size and shape of your heart and how well your heart's chambers and valves are working. This procedure takes approximately one hour. There are no restrictions for this procedure.  Please call our office in May 2022 to schedule your follow up appointment  If you have any questions or concerns before your next appointment please send [place] a message through [place] or call our office at [phone].    TO LEAVE A MESSAGE FOR THE NURSE SELECT OPTION 2, PLEASE LEAVE A MESSAGE INCLUDING: . YOUR NAME . DATE OF BIRTH . CALL BACK NUMBER . REASON FOR CALL**this is important as we prioritize the call backs  [place] AS LONG AS YOU CALL BEFORE 4:00 PM  At the [facility], you and your health needs are our priority. As part of our continuing mission to provide you with exceptional heart care, we have created designated Provider Care Teams. These Care Teams include your primary Cardiologist (physician) and Advanced Practice Providers (APPs- Physician Assistants and Nurse Practitioners) who all work together to provide you with the care you need, when you need it.   You may see any of the following providers on your designated Care Team at your next follow up: [name] Dr Glori Bickers . Dr Loralie Champagne . Darrick Grinder, NP . Lyda Jester, PA . Audry Riles, PharmD   Please be sure to bring in all your medications bottles to every appointment.

## 2020-05-03 NOTE — Addendum Note (Signed)
Encounter addended by: Scarlette Calico, RN on: 05/03/2020 12:05 PM  Actions taken: Order list changed, Diagnosis association updated, Clinical Note Signed

## 2020-05-03 NOTE — Addendum Note (Signed)
Encounter addended by: Scarlette Calico, RN on: 05/03/2020 12:12 PM  Actions taken: Medication long-term status modified, Order list changed, Clinical Note Signed

## 2020-05-15 ENCOUNTER — Ambulatory Visit (HOSPITAL_COMMUNITY): Payer: Medicare Other

## 2020-05-19 ENCOUNTER — Other Ambulatory Visit: Payer: Self-pay

## 2020-05-19 ENCOUNTER — Ambulatory Visit (HOSPITAL_COMMUNITY)
Admission: RE | Admit: 2020-05-19 | Discharge: 2020-05-19 | Disposition: A | Discharge status: Home / Self Care | Payer: Medicare Other | Source: Ambulatory Visit | Attending: Internal Medicine | Admitting: Internal Medicine

## 2020-05-19 DIAGNOSIS — I110 Hypertensive heart disease with heart failure: Secondary | ICD-10-CM | POA: Insufficient documentation

## 2020-05-19 DIAGNOSIS — I358 Other nonrheumatic aortic valve disorders: Secondary | ICD-10-CM | POA: Insufficient documentation

## 2020-05-19 DIAGNOSIS — I509 Heart failure, unspecified: Secondary | ICD-10-CM | POA: Insufficient documentation

## 2020-05-19 DIAGNOSIS — I5022 Chronic systolic (congestive) heart failure: Secondary | ICD-10-CM

## 2020-05-19 LAB — ECHOCARDIOGRAM COMPLETE
AR max vel: 1.04 cm2
AV Area VTI: 0.98 cm2
AV Area mean vel: 1.08 cm2
AV Mean grad: 13 mmHg
AV Peak grad: 20.1 mmHg
Ao pk vel: 2.24 m/s
Area-P 1/2: 3.48 cm2
Single Plane A4C EF: 52.4 %

## 2020-05-19 MED ORDER — PERFLUTREN LIPID MICROSPHERE
1.0000 mL | INTRAVENOUS | Status: AC | PRN
  Administered 2020-05-19: 2 mL via INTRAVENOUS
  Filled 2020-05-19: qty 10

## 2020-05-29 ENCOUNTER — Other Ambulatory Visit: Payer: Self-pay | Admitting: Internal Medicine

## 2020-05-31 ENCOUNTER — Ambulatory Visit (INDEPENDENT_AMBULATORY_CARE_PROVIDER_SITE_OTHER): Payer: Medicare Other

## 2020-05-31 DIAGNOSIS — I5022 Chronic systolic (congestive) heart failure: Secondary | ICD-10-CM | POA: Diagnosis not present

## 2020-06-01 LAB — CUP PACEART REMOTE DEVICE CHECK
Battery Remaining Longevity: 98 mo
Battery Remaining Percentage: 90 %
Battery Voltage: 3.01 V
Brady Statistic RV Percent Paced: 1 %
Date Time Interrogation Session: 20220119080752
HighPow Impedance: 73 Ohm
Implantable Lead Implant Date: 20210420
Implantable Lead Location: 753860
Implantable Pulse Generator Implant Date: 20210420
Lead Channel Impedance Value: 450 Ohm
Lead Channel Pacing Threshold Amplitude: 0.5 V
Lead Channel Pacing Threshold Pulse Width: 0.5 ms
Lead Channel Sensing Intrinsic Amplitude: 12 mV
Lead Channel Setting Pacing Amplitude: 2.5 V
Lead Channel Setting Pacing Pulse Width: 0.5 ms
Lead Channel Setting Sensing Sensitivity: 0.5 mV
Pulse Gen Serial Number: 111013950

## 2020-06-02 ENCOUNTER — Encounter (INDEPENDENT_AMBULATORY_CARE_PROVIDER_SITE_OTHER): Payer: Medicare Other | Admitting: Ophthalmology

## 2020-06-02 ENCOUNTER — Other Ambulatory Visit: Payer: Self-pay

## 2020-06-02 DIAGNOSIS — H35712 Central serous chorioretinopathy, left eye: Secondary | ICD-10-CM | POA: Diagnosis not present

## 2020-06-02 DIAGNOSIS — I10 Essential (primary) hypertension: Secondary | ICD-10-CM | POA: Diagnosis not present

## 2020-06-02 DIAGNOSIS — H35033 Hypertensive retinopathy, bilateral: Secondary | ICD-10-CM

## 2020-06-02 DIAGNOSIS — H43813 Vitreous degeneration, bilateral: Secondary | ICD-10-CM

## 2020-06-02 DIAGNOSIS — H353122 Nonexudative age-related macular degeneration, left eye, intermediate dry stage: Secondary | ICD-10-CM

## 2020-06-12 NOTE — Progress Notes (Signed)
Remote ICD transmission.   

## 2020-06-13 ENCOUNTER — Telehealth (HOSPITAL_COMMUNITY): Payer: Self-pay | Admitting: Pharmacy Technician

## 2020-06-13 NOTE — Telephone Encounter (Signed)
Sent in [employer] application again.  Will follow up.

## 2020-06-14 ENCOUNTER — Telehealth (HOSPITAL_COMMUNITY): Payer: Self-pay | Admitting: *Deleted

## 2020-06-14 NOTE — Telephone Encounter (Signed)
Pt called requesting echo results. No note from [place] to review results with patient.  Routed to [employer] and [place]

## 2020-06-14 NOTE — Telephone Encounter (Signed)
Spoke with patient today, he received a letter from [employer] stated that they needed POI. The letter I received stated that they needed his application resent. I will call and confirm whether they need POI or not.

## 2020-06-15 NOTE — Telephone Encounter (Signed)
Spoke with [employer], they confirmed that the representative confirmed that they do not need POI or another application sent in. Their automated system is sending out incorrect letters to both patient's and offices.  His application is currently in process. The patient is aware.

## 2020-06-15 NOTE — Telephone Encounter (Signed)
Is there room for a televist next week for me to discuss with him?

## 2020-06-15 NOTE — Telephone Encounter (Signed)
Patient had a question about his Eplerenone dosing. Another provider has increased him to 50mg  twice daily. He wanted to confirm with our office if that was ok.  I messaged [facility]) to confirm. That dosage is fine, his potassium levels would need to be monitored. Called and updated the patient. He is going to contact that office about labs.  Also, was able to confirm his 2/11 televisit with the provider.

## 2020-06-16 NOTE — Telephone Encounter (Signed)
Scheduled. Pt aware.

## 2020-06-18 ENCOUNTER — Other Ambulatory Visit (HOSPITAL_COMMUNITY): Payer: Self-pay | Admitting: Cardiology

## 2020-06-23 ENCOUNTER — Other Ambulatory Visit: Payer: Self-pay

## 2020-06-23 ENCOUNTER — Ambulatory Visit (HOSPITAL_COMMUNITY)
Admission: RE | Admit: 2020-06-23 | Discharge: 2020-06-23 | Disposition: A | Discharge status: Home / Self Care | Payer: Medicare Other | Source: Ambulatory Visit | Attending: Internal Medicine | Admitting: Internal Medicine

## 2020-06-23 VITALS — BP 105/73 | HR 85 | Wt 188.0 lb

## 2020-06-23 DIAGNOSIS — I2510 Atherosclerotic heart disease of native coronary artery without angina pectoris: Secondary | ICD-10-CM

## 2020-06-23 DIAGNOSIS — I350 Nonrheumatic aortic (valve) stenosis: Secondary | ICD-10-CM | POA: Diagnosis not present

## 2020-06-23 DIAGNOSIS — I5022 Chronic systolic (congestive) heart failure: Secondary | ICD-10-CM

## 2020-06-23 NOTE — Addendum Note (Signed)
Encounter addended by: Scarlette Calico, RN on: 06/23/2020 2:49 PM  Actions taken: Order list changed, Diagnosis association updated, Clinical Note Signed

## 2020-06-23 NOTE — Patient Instructions (Signed)
Continue current medications  Please call our office in July 2022 to schedule your follow up and echocardiogram  If you have any questions or concerns before your next appointment please send [place] a message through [place] or call our office at [phone].    TO LEAVE A MESSAGE FOR THE NURSE SELECT OPTION 2, PLEASE LEAVE A MESSAGE INCLUDING: . YOUR NAME . DATE OF BIRTH . CALL BACK NUMBER . REASON FOR CALL**this is important as we prioritize the call backs  YOU WILL RECEIVE A CALL BACK THE SAME DAY AS LONG AS YOU CALL BEFORE 4:00 PM

## 2020-06-23 NOTE — Progress Notes (Signed)
Heart Failure TeleHealth Note  Due to national recommendations of social distancing due to [place] 19, Audio/video telehealth visit is felt to be most appropriate for this patient at this time.  See MyChart message from today for patient consent regarding telehealth for [facility]. The patient was identified personally using two identifiers.   Date:  06/23/2020   ID:  Brandon [name], DOB [birth year], MRN [id number]  Location: Home  Provider location: [facility] Type of Visit: Established patient  PCP:  [name], MD  Cardiologist:  [name], MD Primary HF: [place]  Chief Complaint: Heart Failure follow-up   History of Present Illness:  65 y/o smoker (originally from [place], [place]) with h/o CAD s/p previous MI, HTN, HL.  He was admitted 04/27/19 with several days of stuttering CP. Initial ECG showed anterior ST elevation. HsTrop 476 -> >27k.Taken to cath lab which showed chronically occluded RCA (L>>R collaterals) and thrombotic occlusion of proximal LAD. Underwent PCI of LAD.Echo w/ reduced LVEF 30-35% w/ apical aneurysm. RV ok. Post cath, he required milrinone for cardiogenic shock. Diuresed w/ IV Lasix. Milrinone ultimately weaned off   He presents via video conferencing for a telehealth visit today.   Now s/p Barostim. Feels good. Walks 10-15 mins without a problem. No CP. Mild exertional dyspnea. No edema. No syncope or presyncope. Takes extra lasix as needed.  BP 98-112/70s  Echo 05/19/20 EF 20-25% RV mildly HK. moderate AS  Mean gradient 13 AVA 1.2 cm2DI 0.30  ECHO 04/27/19 EF 30-35% RV normal.  Moderate HK LV. Grade IDD.  ECHO 10/29/19 EF 25-30% RV ok. Personally reviewed  PASHA BROAD denies symptoms worrisome for COVID 19.   Past Medical History:  Diagnosis Date  . AICD (automatic cardioverter/defibrillator) present 08/31/2019  . Ankylosing spondylitis ([place])   . Arthritis   . BENIGN PROSTATIC HYPERTROPHY 06/07/2008  .  CHF (congestive heart failure) ([place])   . COLONIC POLYPS, HX OF 06/07/2008  . Depression   . H/O hiatal hernia   . Heart failure ([place])   . HYPERLIPIDEMIA 06/07/2008  . HYPERTENSION 06/07/2008  . Myocardial infarction [facility]) 2005   NSTEMI, s/p LAD stent  . NEPHROLITHIASIS, HX OF 06/07/2008  . STEMI (ST elevation myocardial infarction) ([place]) 04/27/2019   Past Surgical History:  Procedure Laterality Date  . COLONOSCOPY WITH PROPOFOL N/A 04/24/2020   Procedure: COLONOSCOPY WITH PROPOFOL;  Surgeon: [name], MD;  Location: [facility];  Service: Gastroenterology;  Laterality: N/A;  . CORONARY ANGIOPLASTY WITH STENT PLACEMENT  2005   LAD stent, jailed diagonal  . CORONARY/GRAFT ACUTE MI REVASCULARIZATION N/A 04/27/2019   Procedure: Coronary/Graft Acute MI Revascularization;  Surgeon: [name], MD;  Location: [place] [facility];  Service: Cardiovascular;  Laterality: N/A;  . ICD IMPLANT  08/31/2019  . ICD IMPLANT N/A 08/31/2019   Procedure: ICD IMPLANT;  Surgeon: [name], MD;  Location: [place] CV [facility];  Service: Cardiovascular;  Laterality: N/A;  . LAMINECTOMY     lumbar  . LEFT HEART CATH AND CORONARY ANGIOGRAPHY N/A 04/27/2019   Procedure: LEFT HEART CATH AND CORONARY ANGIOGRAPHY;  Surgeon: [name], MD;  Location: [place] [facility];  Service: Cardiovascular;  Laterality: N/A;  . LUMBAR FUSION  01/2012  . POLYPECTOMY  04/24/2020   Procedure: POLYPECTOMY;  Surgeon: [name], MD;  Location: [facility];  Service: Gastroenterology;;  . RIGHT HEART CATH N/A 04/27/2019   Procedure: RIGHT HEART CATH;  Surgeon: [name],  Ander Slade, MD;  Location: [place] [facility];  Service: Cardiovascular;  Laterality: N/A;  . TONSILLECTOMY    . warthin tumor removal Left 2014     Current Outpatient Medications  Medication Sig Dispense Refill  . acetaminophen (TYLENOL) 325 MG tablet Take 1-2 tablets (325-650 mg total) by mouth every 4 (four) hours as  needed for mild pain.    [name] aspirin 81 MG chewable tablet Chew 1 tablet (81 mg total) by mouth daily. 30 tablet 3  . atorvastatin (LIPITOR) 80 MG tablet TAKE 1 TABLET BY MOUTH ONCE DAILY AT 6PM. 90 tablet 3  . Carboxymethylcellulose Sodium (ARTIFICIAL TEARS OP) Place 1 drop into both eyes every other day.    . carvedilol (COREG) 6.25 MG tablet Take 1 tablet (6.25 mg total) by mouth 2 (two) times daily. 60 tablet 6  . citalopram (CELEXA) 10 MG tablet Take 0.5 tablets (5 mg total) by mouth daily. 90 tablet 1  . clopidogrel (PLAVIX) 75 MG tablet take 75 mg by mouth daily. 90 tablet 3  . dapagliflozin propanediol (FARXIGA) 10 MG TABS tablet Take 1 tablet (10 mg total) by mouth daily. 90 tablet 11  . diphenhydrAMINE (BENADRYL) 25 MG tablet Take 25 mg by mouth once a week.    [name] ENTRESTO 24-26 MG TAKE 1 TABLET BY MOUTH 2 (TWO) TIMES DAILY. 180 tablet 3  . eplerenone (INSPRA) 50 MG tablet Take 50 mg by mouth 2 (two) times daily.    [name] ezetimibe (ZETIA) 10 MG tablet TAKE ONE TABLET BY MOUTH DAILY 90 tablet 3  . furosemide (LASIX) 20 MG tablet Take 2 tablets (40 mg total) by mouth as needed. 180 tablet 0  . inFLIXimab in sodium chloride 0.9 % Inject into the vein every 6 (six) weeks.    . tamsulosin (FLOMAX) 0.4 MG CAPS capsule TAKE 1 CAPSULE(0.4 MG) BY MOUTH DAILY 90 capsule 1   No current facility-administered medications for this encounter.    Allergies:   Patient has no known allergies.   Social History:  The patient  reports that he quit smoking about 10 months ago. His smoking use included cigarettes. He has a 20.00 pack-year smoking history. He has never used smokeless tobacco. He reports current alcohol use of about 7.0 standard drinks of alcohol per week. He reports current drug use.   Family History:  The patient's family history includes Arthritis in his sister; Depression in his father; Heart failure in his mother.   ROS:  Please see the history of present illness.   All other systems are  personally reviewed and negative.   BP 105/73 HR 85  Exam:  (Video/Tele Health Call; Exam is subjective and or/visual.) General:  Speaks in full sentences. No resp difficulty. Lungs: Normal respiratory effort with conversation.  Abdomen: Non-distended per patient report Extremities: Pt denies edema. Neuro: Alert & oriented x 3.   Recent Labs: 11/10/2019: B Natriuretic Peptide 186.1 12/23/2019: ALT 19 02/21/2020: Hemoglobin 16.4; Platelets 195 03/23/2020: BUN 20; Creatinine, Ser 0.80; NT-Pro BNP 520; Potassium 4.2; Sodium 137  Personally reviewed   Wt Readings from Last 3 Encounters:  05/03/20 85.5 kg (188 lb 6.4 oz)  04/24/20 84.4 kg (186 lb)  04/19/20 85.1 kg (187 lb 9 oz)      ASSESSMENT AND PLAN:  1.  Chronic systolic heart failure/ischemic cardiomyopathy:  - 04/27/19 ECHO- EF post anterior MI 30 to 35%. - Echo 6/21 EF 25-30% RV ok Personally reviewed - Echo 05/19/20 EF 20-25% RV mildly HK. moderate AS  Mean gradient 13 AVA 1.2 cm2DI 0.30 - s/p ICD - Now s/p Barostim. - Doing well. NYHA II. Volume status stable. Continue lasix PRN.   - Continue eplerenone 50 mg daily - Continue carvedilol 6.25 mg twice a day - Continue Entresto 24-26 mg twice a day. BP too low to titrate - Continue Wilder Glade - Recent labs ok   2. CAD: s/p anterior STEMI 12/20. LHC showed chronically occluded RCA (with L>>R collaterals) and thrombotic occlusion of proximal LAD. Underwent PCI of LAD. - No s/s angina - continue DAPT w/ ASA + Effient for a minimum of 12 months - continue high intensity statin + Zetia. LDL goal < 70 (69 mg/dL 11/21). Follows with Dr. Debara Pickett - Continue Coreg 6.25 mg bid  3.  Tobacco abuse: Former smoker.  - remains abstinent  4.  Hyperlipidemia: -now followed by Dr. Debara Pickett in [facility] and 10 mg Zetia added. Consider PCSK9i - recent LDL 69  5. ETOH  - reports he has quit drinking  6. Aortic Stenosis - Echo results reviewed in detail. Moderate to severe low  gradient AS - Will repeat echo in 6 months - Will likely need TAVR in not too distant future   COVID screen The patient does not have any symptoms that suggest any further testing/ screening at this time.  Social distancing reinforced today.  Recommended follow-up:  As above  Relevant cardiac medications were reviewed at length with the patient today.   The patient does not have concerns regarding their medications at this time.   The following changes were made today:  As above  Today, I have spent 15 minutes with the patient with telehealth technology discussing the above issues .    Signed, Glori Bickers, MD  06/23/2020 8:59 AM  Advanced [facility] Failure [place] [address] Heart and [place] [address] [phone] (office) [phone] (fax)

## 2020-06-26 ENCOUNTER — Encounter: Payer: Self-pay | Admitting: Family Medicine

## 2020-06-27 NOTE — Progress Notes (Unsigned)
Electrophysiology Office Note Date: 06/28/2020  ID:  Brandon [name], DOB 03-Jul-1955, MRN [id number]  PCP: [name], MD Primary Cardiologist: [name], MD Electrophysiologist: [name], MD  Primary HF: Dr. [name]   CC: Barostim and ICD follow up  Brandon [name] is a 65 y.o. male seen today for barostim and ICD follow up. he is doing well since implantation. He has been set at 7.0 ma for his final programming.   Recent visit with Dr. [name]. Currently, he can walk 10-15 minutes without issues and uses lasix as needed 1-2 times a month. He denies any current limitations from a HF perspective today. Denies SOB, lightheadedness, or dizziness. No Orthopnea or PND.    Device History: Barostim(standard)implanted 8/09/9833ASN Chronic systolic CHF St. JudeSingle ChamberICD implanted 0/5397QBH chronic systolic CHF/primary prevention History of appropriate therapy:No History of AAD therapy:No   Past Medical History:  Diagnosis Date  . AICD (automatic cardioverter/defibrillator) present 08/31/2019  . Ankylosing spondylitis ([place])   . Arthritis   . BENIGN PROSTATIC HYPERTROPHY 06/07/2008  . CHF (congestive heart failure) ([place])   . COLONIC POLYPS, HX OF 06/07/2008  . Depression   . H/O hiatal hernia   . Heart failure ([place])   . HYPERLIPIDEMIA 06/07/2008  . HYPERTENSION 06/07/2008  . Myocardial infarction [facility]) 2005   NSTEMI, s/p LAD stent  . NEPHROLITHIASIS, HX OF 06/07/2008  . STEMI (ST elevation myocardial infarction) ([place]) 04/27/2019   Past Surgical History:  Procedure Laterality Date  . COLONOSCOPY WITH PROPOFOL N/A 04/24/2020   Procedure: COLONOSCOPY WITH PROPOFOL;  Surgeon: [name], MD;  [facility];  Service: Gastroenterology;  Laterality: N/A;  . CORONARY ANGIOPLASTY WITH STENT PLACEMENT  2005   LAD stent, jailed diagonal  . CORONARY/GRAFT ACUTE MI REVASCULARIZATION N/A 04/27/2019   Procedure: Coronary/Graft Acute MI  Revascularization;  Surgeon: [name], MD;  Location: [place] [facility];  Service: Cardiovascular;  Laterality: N/A;  . ICD IMPLANT  08/31/2019  . ICD IMPLANT N/A 08/31/2019   Procedure: ICD IMPLANT;  Surgeon: [name], MD;  Location: [place] [facility];  Service: Cardiovascular;  Laterality: N/A;  . LAMINECTOMY     lumbar  . LEFT HEART CATH AND CORONARY ANGIOGRAPHY N/A 04/27/2019   Procedure: LEFT HEART CATH AND CORONARY ANGIOGRAPHY;  Surgeon: [name], MD;  Location: [place] [facility];  Service: Cardiovascular;  Laterality: N/A;  . LUMBAR FUSION  01/2012  . POLYPECTOMY  04/24/2020   Procedure: POLYPECTOMY;  Surgeon: [name], MD;  Location: [facility];  Service: Gastroenterology;;  . RIGHT HEART CATH N/A 04/27/2019   Procedure: RIGHT HEART CATH;  Surgeon: [name], Brycin M, MD;  Location: [place] CV [facility];  Service: Cardiovascular;  Laterality: N/A;  . TONSILLECTOMY    . warthin tumor removal Left 2014    Current Outpatient Medications  Medication Sig Dispense Refill  . acetaminophen (TYLENOL) 325 MG tablet Take 1-2 tablets (325-650 mg total) by mouth every 4 (four) hours as needed for mild pain.    [name] aspirin 81 MG chewable tablet Chew 1 tablet (81 mg total) by mouth daily. 30 tablet 3  . atorvastatin (LIPITOR) 80 MG tablet TAKE 1 TABLET BY MOUTH ONCE DAILY AT 6PM. 90 tablet 3  . Carboxymethylcellulose Sodium (ARTIFICIAL TEARS OP) Place 1 drop into both eyes every other day.    . carvedilol (COREG) 6.25 MG tablet Take 1 tablet (6.25 mg total) by mouth 2 (two) times daily. 60 tablet 6  .  citalopram (CELEXA) 10 MG tablet Take 0.5 tablets (5 mg total) by mouth daily. 90 tablet 1  . clopidogrel (PLAVIX) 75 MG tablet take 75 mg by mouth daily. 90 tablet 3  . dapagliflozin propanediol (FARXIGA) 10 MG TABS tablet Take 1 tablet (10 mg total) by mouth daily. 90 tablet 11  . diphenhydrAMINE (BENADRYL) 25 MG tablet Take 25 mg by mouth once a week.    [name]  ENTRESTO 24-26 MG TAKE 1 TABLET BY MOUTH 2 (TWO) TIMES DAILY. 180 tablet 3  . eplerenone (INSPRA) 50 MG tablet Take 50 mg by mouth 2 (two) times daily.    [name] ezetimibe (ZETIA) 10 MG tablet TAKE ONE TABLET BY MOUTH DAILY 90 tablet 3  . furosemide (LASIX) 20 MG tablet Take 2 tablets (40 mg total) by mouth as needed. 180 tablet 0  . inFLIXimab in sodium chloride 0.9 % Inject into the vein every 6 (six) weeks.    . tamsulosin (FLOMAX) 0.4 MG CAPS capsule TAKE 1 CAPSULE(0.4 MG) BY MOUTH DAILY 90 capsule 1   No current facility-administered medications for this visit.    Allergies:   Patient has no known allergies.   Social History: Social History   Socioeconomic History  . Marital status: Single    Spouse name: Not on file  . Number of children: Not on file  . Years of education: Not on file  . Highest education level: Not on file  Occupational History  . 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 Smoking status: Former Smoker    Packs/day: 0.50    Years: 40.00    Pack years: 20.00    Types: Cigarettes    Quit date: 07/26/2019    Years since quitting: 0.9  . Smokeless tobacco: Never Used  Vaping Use  . Vaping Use: Never used  Substance and Sexual Activity  . Alcohol use: Yes    Alcohol/week: 7.0 standard drinks    Types: 7 Shots of liquor per week    Comment: "2 vodka tonics a night"  . Drug use: Yes    Comment: occasionally - CBD oil  . Sexual activity: Not on file  Other Topics Concern  . Not on file  Social History Narrative   Lives in [place] alone   Works part time for a [employer] in [place] Strain: [place]   . Difficulty of Paying Living Expenses: Not hard at all  Food Insecurity: No Food Insecurity  . Worried About Charity fundraiser in the Last Year: Never true  . Ran Out of Food in the Last Year: Never true  Transportation Needs: No Transportation Needs  . Lack  of Transportation (Medical): No  . Lack of Transportation (Non-Medical): No  Physical Activity: Sufficiently Active  . Days of Exercise per Week: 5 days  . Minutes of Exercise per Session: 40 min  Stress: No Stress Concern Present  . Feeling of Stress : Not at all  Social Connections: Socially Isolated  . Frequency of Communication with Friends and Family: More than three times a week  . Frequency of Social Gatherings with Friends and Family: More than three times a week  . Attends Religious Services: Never  . Active Member of Clubs or Organizations: No  . Attends Archivist Meetings: Never  . Marital Status: Never married  Intimate Partner Violence: Not At Risk  . Fear of Current or Ex-Partner: No  .  Emotionally Abused: No  . Physically Abused: No  . Sexually Abused: No    Family History: Family History  Problem Relation Age of Onset  . Depression Father   . Arthritis Sister        RA  . Heart failure Mother   . Other Neg Hx   . Colon cancer Neg Hx   . Esophageal cancer Neg Hx   . Rectal cancer Neg Hx   . Stomach cancer Neg Hx   . Colon polyps Neg Hx      Review of Systems: All other systems reviewed and are otherwise negative except as noted above.  Physical Exam: Vitals:   06/28/20 0852  BP: 114/64  Pulse: 89  SpO2: 99%  Weight: 192 lb (87.1 kg)  Height: 6' (1.829 m)     GEN- The patient is well appearing, alert and oriented x 3 today.   HEENT: normocephalic, atraumatic; sclera clear, conjunctiva pink; hearing intact; oropharynx clear; neck supple  Lungs- Clear to ausculation bilaterally, normal work of breathing.  No wheezes, rales, rhonchi Heart- Regular rate and rhythm, no murmurs, rubs or gallops  GI- soft, non-tender, non-distended, bowel sounds present  Extremities- no clubbing, cyanosis, or edema  MS- no significant deformity or atrophy Skin- warm and dry, no rash or lesion; PPM pocket well healed Psych- euthymic mood, full affect Neuro-  strength and sensation are intact  Barostim Interrogation- Performed personally and reviewed in detail today,  See scanned report ICD Interrogation - Normal device interrogation. See PaceArt report  EKG:  EKG is not ordered today.  Recent Labs: 11/10/2019: B Natriuretic Peptide 186.1 12/23/2019: ALT 19 02/21/2020: Hemoglobin 16.4; Platelets 195 03/23/2020: BUN 20; Creatinine, Ser 0.80; NT-Pro BNP 520; Potassium 4.2; Sodium 137   Wt Readings from Last 3 Encounters:  06/28/20 192 lb (87.1 kg)  06/23/20 188 lb (85.3 kg)  05/03/20 188 lb 6.4 oz (85.5 kg)     Other studies Reviewed: Additional studies/ records that were reviewed today include: Previous EP and HF notes.  Assessment and Plan:  1. Chronic systolic CHF s/p St. Jude and Barostim implantation NYHA II symptoms. currently  Device set to 7.0 ma for final programming. Device impedence stable. He currently reports "no limitations from a HF standpoint". Normal device function See scanned report. Will follow up q 6 months for continued barostim and ICD management.   2. Ischemic CMP No s/s of ischemia  3. Mod/Sev AS Per most recent Echo.  Per HF notes he will need repeat in 6 months and likely eventual TAVR consideration  4. HTN Continue current medications  Current medicines are reviewed at length with the patient today.   The patient does not have concerns regarding his medicines.  The following changes were made today:  none  Labs/ tests ordered today include:  No orders of the defined types were placed in this encounter.   Disposition:   Follow up with EP APP in 6 Months for further barostim titration.   Jacalyn Lefevre, PA-C  06/28/2020 9:08 AM  [facility] [address] [place] [address] [phone] (office) [phone] (fax)

## 2020-06-28 ENCOUNTER — Ambulatory Visit (INDEPENDENT_AMBULATORY_CARE_PROVIDER_SITE_OTHER): Payer: Medicare Other | Admitting: Student

## 2020-06-28 ENCOUNTER — Encounter: Payer: Self-pay | Admitting: Student

## 2020-06-28 ENCOUNTER — Other Ambulatory Visit: Payer: Self-pay

## 2020-06-28 VITALS — BP 114/64 | HR 89 | Ht 72.0 in | Wt 192.0 lb

## 2020-06-28 DIAGNOSIS — I350 Nonrheumatic aortic (valve) stenosis: Secondary | ICD-10-CM | POA: Diagnosis not present

## 2020-06-28 DIAGNOSIS — I2510 Atherosclerotic heart disease of native coronary artery without angina pectoris: Secondary | ICD-10-CM

## 2020-06-28 DIAGNOSIS — I10 Essential (primary) hypertension: Secondary | ICD-10-CM | POA: Diagnosis not present

## 2020-06-28 DIAGNOSIS — I5022 Chronic systolic (congestive) heart failure: Secondary | ICD-10-CM

## 2020-06-28 LAB — CUP PACEART INCLINIC DEVICE CHECK
Battery Remaining Longevity: 97 mo
Brady Statistic RV Percent Paced: 0 %
Date Time Interrogation Session: 20220216093534
HighPow Impedance: 79.875
Implantable Lead Implant Date: 20210420
Implantable Lead Location: 753860
Implantable Pulse Generator Implant Date: 20210420
Lead Channel Impedance Value: 450 Ohm
Lead Channel Pacing Threshold Amplitude: 0.5 V
Lead Channel Pacing Threshold Amplitude: 0.5 V
Lead Channel Pacing Threshold Pulse Width: 0.5 ms
Lead Channel Pacing Threshold Pulse Width: 0.5 ms
Lead Channel Sensing Intrinsic Amplitude: 11.6 mV
Lead Channel Setting Pacing Amplitude: 2.5 V
Lead Channel Setting Pacing Pulse Width: 0.5 ms
Lead Channel Setting Sensing Sensitivity: 0.5 mV
Pulse Gen Serial Number: 111013950

## 2020-06-28 LAB — BASIC METABOLIC PANEL
BUN/Creatinine Ratio: 17 (ref 10–24)
BUN: 15 mg/dL (ref 8–27)
CO2: 20 mmol/L (ref 20–29)
Calcium: 9.5 mg/dL (ref 8.6–10.2)
Chloride: 101 mmol/L (ref 96–106)
Creatinine, Ser: 0.89 mg/dL (ref 0.76–1.27)
GFR calc Af Amer: 103 mL/min/{1.73_m2} (ref >59)
GFR calc non Af Amer: 89 mL/min/{1.73_m2} (ref >59)
Glucose: 95 mg/dL (ref 65–99)
Potassium: 4.5 mmol/L (ref 3.5–5.2)
Sodium: 137 mmol/L (ref 134–144)

## 2020-06-28 NOTE — Patient Instructions (Signed)
Medication Instructions: . Your physician recommends that you continue on your current medications as directed. Please refer to the Current Medication list given to you today.  *If you need a refill on your cardiac medications before your next appointment, please call your pharmacy*   Lab Work: TODAY: BMET  If you have labs (blood work) drawn today and your tests are completely normal, you will receive your results only by: [name] MyChart Message (if you have MyChart) OR . A paper copy in the mail If you have any lab test that is abnormal or we need to change your treatment, we will call you to review the results.   Follow-Up: At [facility], you and your health needs are our priority.  As part of our continuing mission to provide you with exceptional heart care, we have created designated Provider Care Teams.  These Care Teams include your primary Cardiologist (physician) and Advanced Practice Providers (APPs -  Physician Assistants and Nurse Practitioners) who all work together to provide you with the care you need, when you need it.  Your next appointment:   6 month(s)  The format for your next appointment:   In Person  Provider:   Legrand Como "Oda Kilts, PA-C

## 2020-06-29 NOTE — Telephone Encounter (Signed)
Called [employer] to check the status of the patient's application. Representative stated that we could go ahead and place a refill for him, since they do have everything that is needed with the application. Apparently the patient is a little bit overdue for a refill. He should receive that by 2/22.  Called and spoke with the patient, he was appreciative for the assistance.

## 2020-06-29 NOTE — Telephone Encounter (Signed)
Called [employer] to check the status of the patient's application. Representative stated the patient's application was still pending. They are awaiting insurance verification.

## 2020-07-03 NOTE — Telephone Encounter (Signed)
Advanced Heart Failure Patient Advocate Encounter   Patient was approved to receive Entresto from [employer].  Patient ID: [id number]  Effective dates: 07/03/20 through 05/12/21  Spoke with patient.  Charlann Boxer, CPhT

## 2020-07-06 ENCOUNTER — Other Ambulatory Visit: Payer: Self-pay

## 2020-07-06 ENCOUNTER — Ambulatory Visit (INDEPENDENT_AMBULATORY_CARE_PROVIDER_SITE_OTHER): Payer: Medicare Other | Admitting: Family Medicine

## 2020-07-06 ENCOUNTER — Encounter: Payer: Self-pay | Admitting: Family Medicine

## 2020-07-06 VITALS — BP 92/68 | HR 86 | Temp 98.5°F | Ht 72.0 in | Wt 191.2 lb

## 2020-07-06 DIAGNOSIS — Z122 Encounter for screening for malignant neoplasm of respiratory organs: Secondary | ICD-10-CM

## 2020-07-06 DIAGNOSIS — Z7189 Other specified counseling: Secondary | ICD-10-CM

## 2020-07-06 DIAGNOSIS — I2510 Atherosclerotic heart disease of native coronary artery without angina pectoris: Secondary | ICD-10-CM

## 2020-07-06 DIAGNOSIS — Z87891 Personal history of nicotine dependence: Secondary | ICD-10-CM | POA: Diagnosis not present

## 2020-07-06 NOTE — Progress Notes (Signed)
Subjective:    Patient ID: Brandon [name], male    DOB: Oct 08, 1955, 65 y.o.   MRN: [id number]  Chief Complaint  Patient presents with   Cancer    Wants to discuss low dose options    HPI Patient was seen today for f/u.  Pt states doing well.  Had f/u with Cardiology for AICD.  Able to monitor device from an app on his phone.  States it has not gone off since being placed.  Pt states he is ready to start traveling again, but has concerns about COVID.  Pt received Pfizer Covid vaccines and a booster.  Pt interested in low dose CT for lung cancer screening.  States a close friend was recently dx'd with lung cancer after the screening.  Pt notes smoking 1 to 1/2 pack/day times at least 45 years.  Pt quit smoking in 2021.  Currently asymptomatic.  Past Medical History:  Diagnosis Date   AICD (automatic cardioverter/defibrillator) present 08/31/2019   Ankylosing spondylitis ([place])    Arthritis    BENIGN PROSTATIC HYPERTROPHY 06/07/2008   CHF (congestive heart failure) ([place])    COLONIC POLYPS, HX OF 06/07/2008   Depression    H/O hiatal hernia    Heart failure ([place])    HYPERLIPIDEMIA 06/07/2008   HYPERTENSION 06/07/2008   Myocardial infarction [facility]) 2005   NSTEMI, s/p LAD stent   NEPHROLITHIASIS, HX OF 06/07/2008   STEMI (ST elevation myocardial infarction) ([place]) 04/27/2019    No Known Allergies  ROS General: Denies fever, chills, night sweats, changes in weight, changes in appetite HEENT: Denies headaches, ear pain, changes in vision, rhinorrhea, sore throat CV: Denies CP, palpitations, SOB, orthopnea Pulm: Denies SOB, cough, wheezing GI: Denies abdominal pain, nausea, vomiting, diarrhea, constipation GU: Denies dysuria, hematuria, frequency Msk: Denies muscle cramps, joint pains Neuro: Denies weakness, numbness, tingling Skin: Denies rashes, bruising Psych: Denies depression, anxiety, hallucinations     Objective:    Blood pressure 92/68, pulse 86, temperature 98.5  F (36.9 C), temperature source Oral, height 6' (1.829 m), weight 191 lb 3.2 oz (86.7 kg), SpO2 97 %.  Gen. Pleasant, well-nourished, in no distress, normal affect   HEENT: /AT, face symmetric, conjunctiva clear, no scleral icterus, PERRLA, EOMI, nares patent without drainage Lungs: no accessory muscle use Cardiovascular: RRR, no peripheral edema Musculoskeletal: No deformities, no cyanosis or clubbing, normal tone Neuro:  A&Ox3, CN II-XII intact, normal gait Skin:  Warm, no lesions/ rash   Wt Readings from Last 3 Encounters:  07/06/20 191 lb 3.2 oz (86.7 kg)  06/28/20 192 lb (87.1 kg)  06/23/20 188 lb (85.3 kg)    Lab Results  Component Value Date   WBC 7.8 02/21/2020   HGB 16.4 02/21/2020   HCT 50.9 02/21/2020   PLT 195 02/21/2020   GLUCOSE 95 06/28/2020   CHOL 140 04/04/2020   TRIG 72 04/04/2020   HDL 57 04/04/2020   LDLCALC 69 04/04/2020   ALT 19 12/23/2019   AST 17 12/23/2019   NA 137 06/28/2020   K 4.5 06/28/2020   CL 101 06/28/2020   CREATININE 0.89 06/28/2020   BUN 15 06/28/2020   CO2 20 06/28/2020   TSH 0.889 04/27/2019   PSA 0.96 04/15/2019   INR 1.1 12/23/2019   HGBA1C 6.0 (H) 04/27/2019    Assessment/Plan:  Former smoker -Continued smoking cessation advised - Plan: CT CHEST LUNG CA SCREEN LOW DOSE W/O CM  Educated about COVID-19 virus infection -Discussed the benefits of COVID-19 vaccines -discussed potential s/s -  questions answered to satisfaction  Screening for lung cancer -Plan: CT CHEST LUNG CA SCREEN LOW DOSE W/O CM  F/u prn  Grier Mitts, MD

## 2020-07-30 ENCOUNTER — Other Ambulatory Visit: Payer: Self-pay | Admitting: Internal Medicine

## 2020-07-31 ENCOUNTER — Ambulatory Visit
Admission: RE | Admit: 2020-07-31 | Discharge: 2020-07-31 | Disposition: A | Discharge status: Home / Self Care | Payer: Medicare Other | Source: Ambulatory Visit | Attending: Family Medicine | Admitting: Family Medicine

## 2020-07-31 DIAGNOSIS — Z122 Encounter for screening for malignant neoplasm of respiratory organs: Secondary | ICD-10-CM

## 2020-07-31 DIAGNOSIS — Z87891 Personal history of nicotine dependence: Secondary | ICD-10-CM

## 2020-08-09 ENCOUNTER — Other Ambulatory Visit: Payer: Self-pay | Admitting: Family Medicine

## 2020-08-09 ENCOUNTER — Ambulatory Visit: Payer: Medicare Other | Admitting: Family Medicine

## 2020-08-09 DIAGNOSIS — J432 Centrilobular emphysema: Secondary | ICD-10-CM

## 2020-08-09 MED ORDER — SPIRIVA RESPIMAT 2.5 MCG/ACT IN AERS: 2.0000 | INHALATION_SPRAY | Freq: Every day | RESPIRATORY_TRACT | 3 refills | Status: DC

## 2020-08-09 NOTE — Progress Notes (Signed)
Centrilobular and para septal emphysema noted on low-dose CT chest lung cancer screening.  Will start LABA.  Discussed with patient via phone on 08/09/2020.  Patient endorses SOB with going up steps and exertion.  Patient is a former smoker.

## 2020-08-09 NOTE — Progress Notes (Signed)
Results viewed on MyChart and discussed over phone.

## 2020-08-10 NOTE — Telephone Encounter (Signed)
Pt is calling back with the names of medications that the insurance will cover and they are: Anoro Elipta, Breo Elipta and Incruse Elipta.  Pt stated it doesn't matter which one that Dr. Volanda Napoleon will pick but that he would give them in order in which they were given to him.

## 2020-08-10 NOTE — Telephone Encounter (Signed)
Pt is calling in stating that Rx tiotropium Bromide Monohydrate (SPIRIVA RESPIMAT) 2.5 MCG  Pt stated that he will call his insurance company to see which one they will cover and give [place] a call back.

## 2020-08-10 NOTE — Telephone Encounter (Signed)
Rx not covered.

## 2020-08-25 ENCOUNTER — Encounter (INDEPENDENT_AMBULATORY_CARE_PROVIDER_SITE_OTHER): Payer: Medicare Other | Admitting: Ophthalmology

## 2020-08-25 ENCOUNTER — Other Ambulatory Visit: Payer: Self-pay

## 2020-08-25 DIAGNOSIS — H35033 Hypertensive retinopathy, bilateral: Secondary | ICD-10-CM

## 2020-08-25 DIAGNOSIS — H35712 Central serous chorioretinopathy, left eye: Secondary | ICD-10-CM | POA: Diagnosis not present

## 2020-08-25 DIAGNOSIS — H353132 Nonexudative age-related macular degeneration, bilateral, intermediate dry stage: Secondary | ICD-10-CM

## 2020-08-25 DIAGNOSIS — H43813 Vitreous degeneration, bilateral: Secondary | ICD-10-CM

## 2020-08-25 DIAGNOSIS — I10 Essential (primary) hypertension: Secondary | ICD-10-CM | POA: Diagnosis not present

## 2020-08-30 ENCOUNTER — Ambulatory Visit (INDEPENDENT_AMBULATORY_CARE_PROVIDER_SITE_OTHER): Payer: Medicare Other

## 2020-08-30 DIAGNOSIS — I255 Ischemic cardiomyopathy: Secondary | ICD-10-CM | POA: Diagnosis not present

## 2020-08-31 LAB — CUP PACEART REMOTE DEVICE CHECK
Battery Remaining Longevity: 109 mo
Battery Remaining Percentage: 88 %
Battery Voltage: 2.99 V
Brady Statistic RV Percent Paced: 1 %
Date Time Interrogation Session: 20220420154343
HighPow Impedance: 81 Ohm
Implantable Lead Implant Date: 20210420
Implantable Lead Location: 753860
Implantable Pulse Generator Implant Date: 20210420
Lead Channel Impedance Value: 450 Ohm
Lead Channel Pacing Threshold Amplitude: 0.5 V
Lead Channel Pacing Threshold Pulse Width: 0.5 ms
Lead Channel Sensing Intrinsic Amplitude: 12 mV
Lead Channel Setting Pacing Amplitude: 2.5 V
Lead Channel Setting Pacing Pulse Width: 0.5 ms
Lead Channel Setting Sensing Sensitivity: 0.5 mV
Pulse Gen Serial Number: 111013950

## 2020-09-18 NOTE — Progress Notes (Signed)
Remote ICD transmission.   

## 2020-09-19 ENCOUNTER — Other Ambulatory Visit (HOSPITAL_COMMUNITY): Payer: Self-pay | Admitting: *Deleted

## 2020-09-19 ENCOUNTER — Other Ambulatory Visit (HOSPITAL_COMMUNITY): Payer: Self-pay

## 2020-09-19 DIAGNOSIS — I5022 Chronic systolic (congestive) heart failure: Secondary | ICD-10-CM

## 2020-09-19 MED ORDER — DAPAGLIFLOZIN PROPANEDIOL 10 MG PO TABS: 10.0000 mg | ORAL_TABLET | Freq: Every day | ORAL | 11 refills | Status: DC

## 2020-09-19 MED ORDER — DAPAGLIFLOZIN PROPANEDIOL 10 MG PO TABS: 10.0000 mg | ORAL_TABLET | Freq: Every day | ORAL | 3 refills | Status: DC

## 2020-10-03 ENCOUNTER — Other Ambulatory Visit: Payer: Self-pay | Admitting: Family Medicine

## 2020-10-13 ENCOUNTER — Encounter (HOSPITAL_COMMUNITY): Payer: Self-pay

## 2020-10-18 ENCOUNTER — Telehealth (HOSPITAL_COMMUNITY): Payer: Self-pay | Admitting: Pharmacy Technician

## 2020-10-18 NOTE — Telephone Encounter (Signed)
Advanced Heart Failure Patient Advocate Encounter  Received a call from the patient today. When he tried to refill Entresto through [employer] the automated system told him there was no active RX available. A prescription was attached to the application that was approved in feb.   [employer] and the representative confirmed that the RX was there and available to reorder. The rep advised me to have the patient call in again and wait to speak with a live person.  Called and relayed the information to the patient who will give [employer] a return call.  Charlann Boxer, CPhT

## 2020-10-31 ENCOUNTER — Other Ambulatory Visit (HOSPITAL_COMMUNITY): Payer: Self-pay | Admitting: Internal Medicine

## 2020-10-31 DIAGNOSIS — I5022 Chronic systolic (congestive) heart failure: Secondary | ICD-10-CM

## 2020-10-31 DIAGNOSIS — I255 Ischemic cardiomyopathy: Secondary | ICD-10-CM

## 2020-11-01 ENCOUNTER — Other Ambulatory Visit: Payer: Self-pay

## 2020-11-01 ENCOUNTER — Ambulatory Visit (HOSPITAL_COMMUNITY)
Admission: RE | Admit: 2020-11-01 | Discharge: 2020-11-01 | Disposition: A | Discharge status: Home / Self Care | Payer: Medicare Other | Source: Ambulatory Visit | Attending: Internal Medicine | Admitting: Internal Medicine

## 2020-11-01 ENCOUNTER — Other Ambulatory Visit (HOSPITAL_COMMUNITY): Payer: Self-pay | Admitting: *Deleted

## 2020-11-01 ENCOUNTER — Encounter (HOSPITAL_COMMUNITY): Payer: Self-pay | Admitting: Internal Medicine

## 2020-11-01 ENCOUNTER — Ambulatory Visit (HOSPITAL_BASED_OUTPATIENT_CLINIC_OR_DEPARTMENT_OTHER)
Admission: RE | Admit: 2020-11-01 | Discharge: 2020-11-01 | Disposition: A | Discharge status: Home / Self Care | Payer: Medicare Other | Source: Ambulatory Visit | Attending: Family Medicine | Admitting: Family Medicine

## 2020-11-01 VITALS — BP 96/60 | HR 80 | Ht 72.0 in | Wt 195.2 lb

## 2020-11-01 DIAGNOSIS — I255 Ischemic cardiomyopathy: Secondary | ICD-10-CM | POA: Insufficient documentation

## 2020-11-01 DIAGNOSIS — Z955 Presence of coronary angioplasty implant and graft: Secondary | ICD-10-CM | POA: Diagnosis not present

## 2020-11-01 DIAGNOSIS — I2510 Atherosclerotic heart disease of native coronary artery without angina pectoris: Secondary | ICD-10-CM | POA: Insufficient documentation

## 2020-11-01 DIAGNOSIS — Z87891 Personal history of nicotine dependence: Secondary | ICD-10-CM | POA: Diagnosis not present

## 2020-11-01 DIAGNOSIS — Z95810 Presence of automatic (implantable) cardiac defibrillator: Secondary | ICD-10-CM | POA: Diagnosis not present

## 2020-11-01 DIAGNOSIS — Z79899 Other long term (current) drug therapy: Secondary | ICD-10-CM | POA: Diagnosis not present

## 2020-11-01 DIAGNOSIS — Z8249 Family history of ischemic heart disease and other diseases of the circulatory system: Secondary | ICD-10-CM | POA: Diagnosis not present

## 2020-11-01 DIAGNOSIS — I083 Combined rheumatic disorders of mitral, aortic and tricuspid valves: Secondary | ICD-10-CM | POA: Diagnosis not present

## 2020-11-01 DIAGNOSIS — I252 Old myocardial infarction: Secondary | ICD-10-CM | POA: Insufficient documentation

## 2020-11-01 DIAGNOSIS — I350 Nonrheumatic aortic (valve) stenosis: Secondary | ICD-10-CM

## 2020-11-01 DIAGNOSIS — Z7982 Long term (current) use of aspirin: Secondary | ICD-10-CM | POA: Insufficient documentation

## 2020-11-01 DIAGNOSIS — Z7902 Long term (current) use of antithrombotics/antiplatelets: Secondary | ICD-10-CM | POA: Insufficient documentation

## 2020-11-01 DIAGNOSIS — I110 Hypertensive heart disease with heart failure: Secondary | ICD-10-CM | POA: Insufficient documentation

## 2020-11-01 DIAGNOSIS — R0602 Shortness of breath: Secondary | ICD-10-CM

## 2020-11-01 DIAGNOSIS — E785 Hyperlipidemia, unspecified: Secondary | ICD-10-CM | POA: Diagnosis not present

## 2020-11-01 DIAGNOSIS — I5043 Acute on chronic combined systolic (congestive) and diastolic (congestive) heart failure: Secondary | ICD-10-CM | POA: Diagnosis not present

## 2020-11-01 DIAGNOSIS — Z9861 Coronary angioplasty status: Secondary | ICD-10-CM

## 2020-11-01 DIAGNOSIS — I5022 Chronic systolic (congestive) heart failure: Secondary | ICD-10-CM | POA: Diagnosis not present

## 2020-11-01 LAB — BASIC METABOLIC PANEL
Anion gap: 7 (ref 5–15)
BUN: 10 mg/dL (ref 8–23)
CO2: 25 mmol/L (ref 22–32)
Calcium: 8.8 mg/dL — ABNORMAL LOW (ref 8.9–10.3)
Chloride: 99 mmol/L (ref 98–111)
Creatinine, Ser: 0.82 mg/dL (ref 0.61–1.24)
GFR, Estimated: >60 mL/min (ref >60)
Glucose, Bld: 106 mg/dL — ABNORMAL HIGH (ref 70–99)
Potassium: 4.1 mmol/L (ref 3.5–5.1)
Sodium: 131 mmol/L — ABNORMAL LOW (ref 135–145)

## 2020-11-01 LAB — BRAIN NATRIURETIC PEPTIDE: B Natriuretic Peptide: 462.1 pg/mL — ABNORMAL HIGH (ref 0.0–100.0)

## 2020-11-01 LAB — ECHOCARDIOGRAM COMPLETE
AR max vel: 1.51 cm2
AV Area VTI: 1.44 cm2
AV Area mean vel: 1.37 cm2
AV Mean grad: 14.3 mmHg
AV Peak grad: 24.3 mmHg
Ao pk vel: 2.46 m/s
Area-P 1/2: 5.27 cm2
S' Lateral: 4 cm
Single Plane A4C EF: 27.4 %

## 2020-11-01 LAB — CBC
HCT: 42.7 % (ref 39.0–52.0)
Hemoglobin: 14.2 g/dL (ref 13.0–17.0)
MCH: 30.1 pg (ref 26.0–34.0)
MCHC: 33.3 g/dL (ref 30.0–36.0)
MCV: 90.5 fL (ref 80.0–100.0)
Platelets: 238 10*3/uL (ref 150–400)
RBC: 4.72 MIL/uL (ref 4.22–5.81)
RDW: 13.8 % (ref 11.5–15.5)
WBC: 10 10*3/uL (ref 4.0–10.5)
nRBC: 0 % (ref 0.0–0.2)

## 2020-11-01 NOTE — Progress Notes (Addendum)
Advanced Heart Failure Clinic Note Date:  11/01/2020   ID:  Brandon [name], DOB 1956-01-24, MRN [id number]  Location: Home  Provider location: [name] Type of Visit: Established patient  PCP:  [name], MD  Cardiologist:  [name], MD Primary HF: [name]  Chief Complaint: Heart Failure follow-up   History of Present Illness:  Brandon [name] is a 65 y/o smoker (originally from [place], [place]) with h/o CAD s/p previous MI, HTN, HL.   He was admitted 04/27/19 with anterior ST elevation. Taken to cath lab which showed chronically occluded RCA (L>>R collaterals) and thrombotic occlusion of proximal LAD. Underwent PCI of LAD. Echo w/ reduced LVEF 30-35% w/ apical aneurysm. RV ok. Post cath, he required milrinone for cardiogenic shock.   Now s/p Barostim.  Here for routine follow-up. Over the last months seems like he has been a bit more SOB with exertion lately. Was walking 10-15 mins daily but now cut back because it harder. No dizziness or CP. Taking lasix about once every 2 weeks. No edema or PND. + orthopnea. + dry cough Complaint with meds. BP runs 90-100 range at home.   Echo today 11/01/20: EF 20-25% RV mildly HK. Moderate to severe low-flow AS DI 0.34 Aortic valve mean gradient measures 14.3 mmHg. Aortic valve peak gradient measures 24.3 mmHg. Aortic valve area, by VTI measures 1.44 cm.  Echo 05/19/20 EF 20-25% RV mildly HK. moderate AS  Mean gradient 13 AVA 1.2 cm2DI 0.30   ECHO 04/27/19 EF 30-35% RV normal.  Moderate HK LV. Grade IDD.  ECHO 10/29/19 EF 25-30% RV ok. Personally reviewed   Past Medical History:  Diagnosis Date   AICD (automatic cardioverter/defibrillator) present 08/31/2019   Ankylosing spondylitis (Brandon [name])    Arthritis    BENIGN PROSTATIC HYPERTROPHY 06/07/2008   CHF (congestive heart failure) (Brandon [name])    COLONIC POLYPS, HX OF 06/07/2008   Depression    H/O hiatal hernia    Heart failure (Brandon [name])    HYPERLIPIDEMIA 06/07/2008    HYPERTENSION 06/07/2008   Myocardial infarction [facility]) 2005   NSTEMI, s/p LAD stent   NEPHROLITHIASIS, HX OF 06/07/2008   STEMI (ST elevation myocardial infarction) (Brandon [name]) 04/27/2019   Past Surgical History:  Procedure Laterality Date   COLONOSCOPY WITH PROPOFOL N/A 04/24/2020   Procedure: COLONOSCOPY WITH PROPOFOL;  Surgeon: [name], MD;  [facility];  Service: Gastroenterology;  Laterality: N/A;   CORONARY ANGIOPLASTY WITH STENT PLACEMENT  2005   LAD stent, jailed diagonal   CORONARY/GRAFT ACUTE MI REVASCULARIZATION N/A 04/27/2019   Procedure: Coronary/Graft Acute MI Revascularization;  Surgeon: [name], MD;  Location: [place] [facility];  Service: Cardiovascular;  Laterality: N/A;   ICD IMPLANT  08/31/2019   ICD IMPLANT N/A 08/31/2019   Procedure: ICD IMPLANT;  Surgeon: [name], MD;  Location: [place] CV [facility];  Service: Cardiovascular;  Laterality: N/A;   LAMINECTOMY     lumbar   LEFT HEART CATH AND CORONARY ANGIOGRAPHY N/A 04/27/2019   Procedure: LEFT HEART CATH AND CORONARY ANGIOGRAPHY;  Surgeon: [name], MD;  Location: [place] [facility];  Service: Cardiovascular;  Laterality: N/A;   LUMBAR FUSION  01/2012   POLYPECTOMY  04/24/2020   Procedure: POLYPECTOMY;  Surgeon: [name], MD;  [facility];  Service: Gastroenterology;;   RIGHT HEART CATH N/A 04/27/2019   Procedure: RIGHT HEART CATH;  Surgeon: [name], [name], MD;  Location: [place] [facility];  Service:  Cardiovascular;  Laterality: N/A;   TONSILLECTOMY     warthin tumor removal Left 2014     Current Outpatient Medications  Medication Sig Dispense Refill   acetaminophen (TYLENOL) 325 MG tablet Take 1-2 tablets (325-650 mg total) by mouth every 4 (four) hours as needed for mild pain.     aspirin 81 MG chewable tablet Chew 1 tablet (81 mg total) by mouth daily. 30 tablet 3   atorvastatin (LIPITOR) 80 MG tablet TAKE 1 TABLET BY MOUTH ONCE DAILY AT  6PM. 90 tablet 3   Carboxymethylcellulose Sodium (ARTIFICIAL TEARS OP) Place 1 drop into both eyes every other day.     clopidogrel (PLAVIX) 75 MG tablet STOP EFFIENT. WAIT 24 HOURS. TAKE 300 MG OF PLAVIX ONCE. THEN TAKE 75 MG BY MOUTH DAILY. 90 tablet 3   COVID-19 mRNA vaccine, Pfizer, 30 MCG/0.3ML injection USE AS DIRECTED .3 mL 0   dapagliflozin propanediol (FARXIGA) 10 MG TABS tablet Take 1 tablet (10 mg total) by mouth daily. 90 tablet 3   diphenhydrAMINE (BENADRYL) 25 MG tablet Take 25 mg by mouth once a week.     ENTRESTO 24-26 MG TAKE 1 TABLET BY MOUTH 2 (TWO) TIMES DAILY. 180 tablet 3   eplerenone (INSPRA) 50 MG tablet Take 50 mg by mouth 2 (two) times daily.     ezetimibe (ZETIA) 10 MG tablet TAKE ONE TABLET BY MOUTH DAILY 90 tablet 3   furosemide (LASIX) 20 MG tablet Take 2 tablets (40 mg total) by mouth as needed. 180 tablet 0   inFLIXimab in sodium chloride 0.9 % Inject into the vein every 6 (six) weeks.     tamsulosin (FLOMAX) 0.4 MG CAPS capsule TAKE 1 CAPSULE(0.4 MG) BY MOUTH DAILY 90 capsule 1   umeclidinium bromide (INCRUSE ELLIPTA) 62.5 MCG/INH AEPB Inhale 1 puff into the lungs daily. 30 each 3   carvedilol (COREG) 6.25 MG tablet TAKE 1 TABLET BY MOUTH TWICE A DAY 180 tablet 3   citalopram (CELEXA) 10 MG tablet Take 0.5 tablets (5 mg total) by mouth daily. (Patient not taking: Reported on 11/01/2020) 90 tablet 1   No current facility-administered medications for this encounter.    Allergies:   Patient has no known allergies.   Social History:  The patient  reports that he quit smoking about 15 months ago. His smoking use included cigarettes. He has a 40.00 pack-year smoking history. He has never used smokeless tobacco. He reports current alcohol use of about 7.0 standard drinks of alcohol per week. He reports current drug use.   Family History:  The patient's family history includes Arthritis in his sister; Depression in his father; Heart failure in his mother.   ROS:   Please see the history of present illness.   All other systems are personally reviewed and negative.   Vitals:   11/01/20 1108  BP: 96/60  Pulse: 80  SpO2: 100%     Exam:   General:  Well appearing. No resp difficulty HEENT: normal Neck: supple. no JVD. Carotids 2+ bilat; no bruits. No lymphadenopathy or thryomegaly appreciated. Cor: PMI nondisplaced. Regular rate & rhythm. 2/6 AS S2 oblitertated Lungs: clear Abdomen: soft, nontender, nondistended. No hepatosplenomegaly. No bruits or masses. Good bowel sounds. Extremities: no cyanosis, clubbing, rash, edema Neuro: alert & orientedx3, cranial nerves grossly intact. moves all 4 extremities w/o difficulty. Affect pleasant   Recent Labs: 11/10/2019: B Natriuretic Peptide 186.1 12/23/2019: ALT 19 02/21/2020: Hemoglobin 16.4; Platelets 195 03/23/2020: NT-Pro BNP 520 06/28/2020: BUN 15; Creatinine, Ser  0.89; Potassium 4.5; Sodium 137  Personally reviewed   Wt Readings from Last 3 Encounters:  11/01/20 88.5 kg (195 lb 3.2 oz)  07/06/20 86.7 kg (191 lb 3.2 oz)  06/28/20 87.1 kg (192 lb)      ASSESSMENT AND PLAN:  1.  Chronic systolic heart failure/ischemic cardiomyopathy:  - 04/27/19 ECHO- EF post anterior MI 30 to 35%. - Echo 6/21 EF 25-30% RV ok Personally reviewed - Echo 05/19/20 EF 20-25% RV mildly HK. moderate AS  Mean gradient 13 AVA 1.2 cm2DI 0.30 - s/p ICD - Now s/p Barostim. - Worse NYHA III. REDS lung water ok at 30% but impedance down. Will have him take lasix for 2 days then weekly - Continue eplerenone 50 mg daily - Continue carvedilol 6.25 mg twice a day - Continue Entresto 24-26 mg twice a day. BP too low to titrate - Continue Farxiga - Labs today - ICD interrogated personally no VT/AF. Impedance low - He is headed towards advanced therapies. Likely not transplant candidate given age and lung disease. Will check PFTs. Will refer to Dr. Burt Knack for TAVR evaluation. Suspect he will improve with TAVR now but may still  need VAD down the road. If not felt to be good TAVR candidate then will need to proceed with VAD eval.    2. CAD: s/p anterior STEMI 12/20. LHC showed chronically occluded RCA (with L>>R collaterals) and thrombotic occlusion of proximal LAD. Underwent PCI of LAD. - No  s/s angina - continue DAPT w/ ASA + Effient for a minimum of 12 months - continue high intensity statin + Zetia. LDL goal < 70 (69 mg/dL 11/21). Follows with Dr. Debara Pickett - Continue Coreg 6.25 mg bid   3.  Tobacco abuse: Former smoker.  - remains abstinent - no change   4.  Hyperlipidemia: -now followed by Dr. Debara Pickett in [facility] and 10 mg Zetia added. Consider PCSK9i - last LDL 69   5. ETOH  - he has quit drinking   6. Aortic Stenosis - Echo results reviewed in detail. Moderate to severe low gradient AS.  - As above, refer for TAVR eval   Total time spent 35 minutes. Over half that time spent discussing above.     Signed, Glori Bickers, MD  11/01/2020 11:38 AM  Advanced Heart Failure [place] [id number] [phone] (office) [phone] (fax)

## 2020-11-01 NOTE — H&P (View-Only) (Signed)
Advanced Heart Failure Clinic Note Date:  11/01/2020   ID:  Brandon [name], DOB [birth year], MRN [id number]  Location: Home  Provider location: [facility] Type of Visit: Established patient  PCP:  [name], MD  Cardiologist:  [name], MD Primary HF: Brandon [name]  Chief Complaint: Heart Failure follow-up   History of Present Illness:  Brandon [name] is a 65 y/o smoker (originally from [place], [place]) with h/o CAD s/p previous MI, HTN, HL.   He was admitted 04/27/19 with anterior ST elevation. Taken to cath lab which showed chronically occluded RCA (L>>R collaterals) and thrombotic occlusion of proximal LAD. Underwent PCI of LAD. Echo w/ reduced LVEF 30-35% w/ apical aneurysm. RV ok. Post cath, he required milrinone for cardiogenic shock.   Now s/p Barostim.  Here for routine follow-up. Over the last months seems like he has been a bit more SOB with exertion lately. Was walking 10-15 mins daily but now cut back because it harder. No dizziness or CP. Taking lasix about once every 2 weeks. No edema or PND. + orthopnea. + dry cough Complaint with meds. BP runs 90-100 range at home.   Echo today 11/01/20: EF 20-25% RV mildly HK. Moderate to severe low-flow AS DI 0.34 Aortic valve mean gradient measures 14.3 mmHg. Aortic valve peak gradient measures 24.3 mmHg. Aortic valve area, by VTI measures 1.44 cm.  Echo 05/19/20 EF 20-25% RV mildly HK. moderate AS  Mean gradient 13 AVA 1.2 cm2DI 0.30   ECHO 04/27/19 EF 30-35% RV normal.  Moderate HK LV. Grade IDD.  ECHO 10/29/19 EF 25-30% RV ok. Personally reviewed   Past Medical History:  Diagnosis Date   AICD (automatic cardioverter/defibrillator) present 08/31/2019   Ankylosing spondylitis ([place])    Arthritis    BENIGN PROSTATIC HYPERTROPHY 06/07/2008   CHF (congestive heart failure) ([place])    COLONIC POLYPS, HX OF 06/07/2008   Depression    H/O hiatal hernia    Heart failure ([place])    HYPERLIPIDEMIA 06/07/2008    HYPERTENSION 06/07/2008   Myocardial infarction [facility]) 2005   NSTEMI, s/p LAD stent   NEPHROLITHIASIS, HX OF 06/07/2008   STEMI (ST elevation myocardial infarction) ([place]) 04/27/2019   Past Surgical History:  Procedure Laterality Date   COLONOSCOPY WITH PROPOFOL N/A 04/24/2020   Procedure: COLONOSCOPY WITH PROPOFOL;  Surgeon: [name], MD;  [facility];  Service: Gastroenterology;  Laterality: N/A;   CORONARY ANGIOPLASTY WITH STENT PLACEMENT  2005   LAD stent, jailed diagonal   CORONARY/GRAFT ACUTE MI REVASCULARIZATION N/A 04/27/2019   Procedure: Coronary/Graft Acute MI Revascularization;  Surgeon: [name], MD;  Location: [place] [facility];  Service: Cardiovascular;  Laterality: N/A;   ICD IMPLANT  08/31/2019   ICD IMPLANT N/A 08/31/2019   Procedure: ICD IMPLANT;  Surgeon: [name], MD;  Location: [place] [facility];  Service: Cardiovascular;  Laterality: N/A;   LAMINECTOMY     lumbar   LEFT HEART CATH AND CORONARY ANGIOGRAPHY N/A 04/27/2019   Procedure: LEFT HEART CATH AND CORONARY ANGIOGRAPHY;  Surgeon: [name], MD;  Location: [place] CV [facility];  Service: Cardiovascular;  Laterality: N/A;   LUMBAR FUSION  01/2012   POLYPECTOMY  04/24/2020   Procedure: POLYPECTOMY;  Surgeon: [name], MD;  [facility];  Service: Gastroenterology;;   RIGHT HEART CATH N/A 04/27/2019   Procedure: RIGHT HEART CATH;  Surgeon: [name], Alexie M, MD;  Location: [place] [facility];  Service:  Cardiovascular;  Laterality: N/A;   TONSILLECTOMY     warthin tumor removal Left 2014     Current Outpatient Medications  Medication Sig Dispense Refill   acetaminophen (TYLENOL) 325 MG tablet Take 1-2 tablets (325-650 mg total) by mouth every 4 (four) hours as needed for mild pain.     aspirin 81 MG chewable tablet Chew 1 tablet (81 mg total) by mouth daily. 30 tablet 3   atorvastatin (LIPITOR) 80 MG tablet TAKE 1 TABLET BY MOUTH ONCE DAILY AT  6PM. 90 tablet 3   Carboxymethylcellulose Sodium (ARTIFICIAL TEARS OP) Place 1 drop into both eyes every other day.     clopidogrel (PLAVIX) 75 MG tablet STOP EFFIENT. WAIT 24 HOURS. TAKE 300 MG OF PLAVIX ONCE. THEN TAKE 75 MG BY MOUTH DAILY. 90 tablet 3   COVID-19 mRNA vaccine, Pfizer, 30 MCG/0.3ML injection USE AS DIRECTED .3 mL 0   dapagliflozin propanediol (FARXIGA) 10 MG TABS tablet Take 1 tablet (10 mg total) by mouth daily. 90 tablet 3   diphenhydrAMINE (BENADRYL) 25 MG tablet Take 25 mg by mouth once a week.     ENTRESTO 24-26 MG TAKE 1 TABLET BY MOUTH 2 (TWO) TIMES DAILY. 180 tablet 3   eplerenone (INSPRA) 50 MG tablet Take 50 mg by mouth 2 (two) times daily.     ezetimibe (ZETIA) 10 MG tablet TAKE ONE TABLET BY MOUTH DAILY 90 tablet 3   furosemide (LASIX) 20 MG tablet Take 2 tablets (40 mg total) by mouth as needed. 180 tablet 0   inFLIXimab in sodium chloride 0.9 % Inject into the vein every 6 (six) weeks.     tamsulosin (FLOMAX) 0.4 MG CAPS capsule TAKE 1 CAPSULE(0.4 MG) BY MOUTH DAILY 90 capsule 1   umeclidinium bromide (INCRUSE ELLIPTA) 62.5 MCG/INH AEPB Inhale 1 puff into the lungs daily. 30 each 3   carvedilol (COREG) 6.25 MG tablet TAKE 1 TABLET BY MOUTH TWICE A DAY 180 tablet 3   citalopram (CELEXA) 10 MG tablet Take 0.5 tablets (5 mg total) by mouth daily. (Patient not taking: Reported on 11/01/2020) 90 tablet 1   No current facility-administered medications for this encounter.    Allergies:   Patient has no known allergies.   Social History:  The patient  reports that he quit smoking about 15 months ago. His smoking use included cigarettes. He has a 40.00 pack-year smoking history. He has never used smokeless tobacco. He reports current alcohol use of about 7.0 standard drinks of alcohol per week. He reports current drug use.   Family History:  The patient's family history includes Arthritis in his sister; Depression in his father; Heart failure in his mother.   ROS:   Please see the history of present illness.   All other systems are personally reviewed and negative.   Vitals:   11/01/20 1108  BP: 96/60  Pulse: 80  SpO2: 100%     Exam:   General:  Well appearing. No resp difficulty HEENT: normal Neck: supple. no JVD. Carotids 2+ bilat; no bruits. No lymphadenopathy or thryomegaly appreciated. Cor: PMI nondisplaced. Regular rate & rhythm. 2/6 AS S2 oblitertated Lungs: clear Abdomen: soft, nontender, nondistended. No hepatosplenomegaly. No bruits or masses. Good bowel sounds. Extremities: no cyanosis, clubbing, rash, edema Neuro: alert & orientedx3, cranial nerves grossly intact. moves all 4 extremities w/o difficulty. Affect pleasant   Recent Labs: 11/10/2019: B Natriuretic Peptide 186.1 12/23/2019: ALT 19 02/21/2020: Hemoglobin 16.4; Platelets 195 03/23/2020: NT-Pro BNP 520 06/28/2020: BUN 15; Creatinine, Ser  0.89; Potassium 4.5; Sodium 137  Personally reviewed   Wt Readings from Last 3 Encounters:  11/01/20 88.5 kg (195 lb 3.2 oz)  07/06/20 86.7 kg (191 lb 3.2 oz)  06/28/20 87.1 kg (192 lb)      ASSESSMENT AND PLAN:  1.  Chronic systolic heart failure/ischemic cardiomyopathy:  - 04/27/19 ECHO- EF post anterior MI 30 to 35%. - Echo 6/21 EF 25-30% RV ok Personally reviewed - Echo 05/19/20 EF 20-25% RV mildly HK. moderate AS  Mean gradient 13 AVA 1.2 cm2DI 0.30 - s/p ICD - Now s/p Barostim. - Worse NYHA III. REDS lung water ok at 30% but impedance down. Will have him take lasix for 2 days then weekly - Continue eplerenone 50 mg daily - Continue carvedilol 6.25 mg twice a day - Continue Entresto 24-26 mg twice a day. BP too low to titrate - Continue Farxiga - Labs today - ICD interrogated personally no VT/AF. Impedance low - He is headed towards advanced therapies. Likely not transplant candidate given age and lung disease. Will check PFTs. Will refer to Dr. Burt Knack for TAVR evaluation. Suspect he will improve with TAVR now but may still  need VAD down the road. If not felt to be good TAVR candidate then will need to proceed with VAD eval.    2. CAD: s/p anterior STEMI 12/20. LHC showed chronically occluded RCA (with L>>R collaterals) and thrombotic occlusion of proximal LAD. Underwent PCI of LAD. - No  s/s angina - continue DAPT w/ ASA + Effient for a minimum of 12 months - continue high intensity statin + Zetia. LDL goal < 70 (69 mg/dL 11/21). Follows with Dr. Debara Pickett - Continue Coreg 6.25 mg bid   3.  Tobacco abuse: Former smoker.  - remains abstinent - no change   4.  Hyperlipidemia: -now followed by Dr. Debara Pickett in [facility] and 10 mg Zetia added. Consider PCSK9i - last LDL 69   5. ETOH  - he has quit drinking   6. Aortic Stenosis - Echo results reviewed in detail. Moderate to severe low gradient AS.  - As above, refer for TAVR eval   Total time spent 35 minutes. Over half that time spent discussing above.     Signed, Brandon Bickers, MD  11/01/2020 11:38 AM  Advanced Heart Failure [place] [id number] [phone] (office) [phone] (fax)

## 2020-11-01 NOTE — Patient Instructions (Addendum)
1. CBC, BMP, BNP today. 2. Will schedule PFTs (lung test) for you. 3. Increase Lasix to 40 mg once per week. Take 40 mg today and tomorrow for extra fluid on board. 4. Return to Heart Failure Clinic in 2 months.

## 2020-11-01 NOTE — Progress Notes (Signed)
  Echocardiogram 2D Echocardiogram has been performed.  Matilde Bash 11/01/2020, 10:59 AM

## 2020-11-14 ENCOUNTER — Other Ambulatory Visit (HOSPITAL_COMMUNITY)
Admission: RE | Admit: 2020-11-14 | Discharge: 2020-11-14 | Disposition: A | Discharge status: Home / Self Care | Payer: Medicare Other | Source: Ambulatory Visit | Attending: Internal Medicine | Admitting: Internal Medicine

## 2020-11-14 DIAGNOSIS — Z20822 Contact with and (suspected) exposure to covid-19: Secondary | ICD-10-CM | POA: Diagnosis not present

## 2020-11-14 DIAGNOSIS — Z01812 Encounter for preprocedural laboratory examination: Secondary | ICD-10-CM | POA: Insufficient documentation

## 2020-11-15 ENCOUNTER — Encounter: Payer: Self-pay | Admitting: Cardiovascular Disease

## 2020-11-15 ENCOUNTER — Inpatient Hospital Stay (HOSPITAL_COMMUNITY): Admission: RE | Admit: 2020-11-15 | Payer: Medicare Other | Source: Ambulatory Visit

## 2020-11-15 ENCOUNTER — Other Ambulatory Visit: Payer: Self-pay

## 2020-11-15 ENCOUNTER — Ambulatory Visit (INDEPENDENT_AMBULATORY_CARE_PROVIDER_SITE_OTHER): Payer: Medicare Other | Admitting: Cardiovascular Disease

## 2020-11-15 VITALS — BP 88/60 | HR 90 | Ht 72.0 in | Wt 187.8 lb

## 2020-11-15 DIAGNOSIS — I350 Nonrheumatic aortic (valve) stenosis: Secondary | ICD-10-CM

## 2020-11-15 DIAGNOSIS — I255 Ischemic cardiomyopathy: Secondary | ICD-10-CM | POA: Diagnosis not present

## 2020-11-15 LAB — SARS CORONAVIRUS 2 (TAT 6-24 HRS): SARS Coronavirus 2: NEGATIVE

## 2020-11-15 MED ORDER — IVABRADINE HCL 5 MG PO TABS: ORAL_TABLET | ORAL | 0 refills | Status: DC

## 2020-11-15 MED ORDER — FUROSEMIDE 40 MG PO TABS: 40.0000 mg | ORAL_TABLET | Freq: Every day | ORAL | 3 refills | Status: DC

## 2020-11-15 NOTE — Patient Instructions (Signed)
Medication Instructions:  Your physician has recommended you make the following change in your medication:   1.) change furosemide (Lasix) to 40 mg - one tablet by mouth every day  *If you need a refill on your cardiac medications before your next appointment, please call your pharmacy*   Lab Work: None today   Testing/Procedures: CT scans as scheduled  Your physician has requested that you have a cardiac catheterization. Cardiac catheterization is used to diagnose and/or treat various heart conditions. Doctors may recommend this procedure for a number of different reasons. The most common reason is to evaluate chest pain. Chest pain can be a symptom of coronary artery disease (CAD), and cardiac catheterization can show whether plaque is narrowing or blocking your heart's arteries. This procedure is also used to evaluate the valves, as well as measure the blood flow and oxygen levels in different parts of your heart. For further information please visit [email or web address][facility][email or web address]. Please follow instruction sheet, as given.   Other Instructions    [facility] OFFICE [place], [place] [address] Dept: [phone] Loc: [place]  11/15/2020  You will be scheduled for a Cardiac Catheterization.  We will contact you with the date/time.  1. Please arrive at the [facility] (Main Entrance A) at [facility]: [address] [place], [place] [address]. (This time is two hours before your procedure to ensure your preparation). Free valet parking service is available.   Special note: Every effort is made to have your procedure done on time. Please understand that emergencies sometimes delay scheduled procedures.  2. Diet: Do not eat solid foods after midnight.  The patient may have clear liquids until 5am upon the day of the procedure.  3. Labs: none today  4. Medication  instructions in preparation for your procedure:   Contrast Allergy: No   Do not take Lasix the day of your procedure.   On the morning of your procedure, take your aspirin 81 mg and Plavix/Clopidogrel and any morning medicines NOT listed above.  You may use sips of water.  5. Plan for one night stay--bring personal belongings. 6. Bring a current list of your medications and current insurance cards. 7. You MUST have a responsible person to drive you home. 8. Someone MUST be with you the first 24 hours after you arrive home or your discharge will be delayed. 9. Please wear clothes that are easy to get on and off and wear slip-on shoes.  Thank you for allowing [place] to care for you!   -- [facility]

## 2020-11-15 NOTE — Progress Notes (Signed)
[facility]ULAR [facility]ENTER   MULTIDISCIPLINARY HEART VALVE TEAM  Date:  11/18/2020   ID:  Brandon [name], DOB [birth year], MRN [id number]  PCP:  [name], MD   Chief Complaint  Patient presents with   Shortness of Breath      HISTORY OF PRESENT ILLNESS: Brandon [name] is a 65 y.o. male who presents for evaluation of aortic stenosis, referred by Dr Dr [name].  The patient has a history of coronary artery disease with previous myocardial infarction.  He presented in December 2020 with an anterior STEMI.  Emergency cardiac catheterization demonstrated chronic total occlusion of the right coronary with left-to-right collaterals and acute thrombotic occlusion of the proximal LAD.  The patient underwent primary PCI with stenting of the proximal LAD, but his LVEF was 30 to 35% post MI with an apical aneurysm noted.  He required milrinone for treatment of cardiogenic shock at the time of his MI.  He subsequently has undergone ICD implantation as well as Barostim implantation.  The patient has been noted to have moderate aortic stenosis over recent echo studies.  There has been a question of whether he might have severe low-flow low gradient aortic stenosis, especially in the setting of worsening symptoms of heart failure despite optimal medical therapy under the care of the advanced heart failure team.  He is referred today for consideration of TAVR for treatment of severe low-flow low gradient (stage D2) aortic stenosis.  The patient is here with his sister, [name] today. He remembers having a heart murmur since he was a young man. He has no hx of rheumatic fever and no family hx of valvular heart disease to the best of his knowledge. He reports that he did very well for the first 6-12 months after his MI in December 2020. He felt good energy and improved stamina with physical activity. He feels that over the past 2-3 months, he is having more problems with exertional fatigue and dyspnea. When he  has shortness of breath, he also experiences nausea and sometimes he vomits 'fluid.' He takes lasix prn and always feels better when he takes it. He has mild chest pressure at times but this is not as prominent as dyspnea and fatigue. He denies lightheadedness or syncope.   The patient has had regular dental care and reports no problems. He sees his dentist avery 6 months. Other than his cardiac procedures, he has had lumbar fusion, diskectomy, and lasik eye surgery. He has undergone lung cancer screening and was noted to have radiographic signs of emphysema but has hasn't had hospital admission for pulmonary problems and has never required home O2.   Past Medical History:  Diagnosis Date   AICD (automatic cardioverter/defibrillator) present 08/31/2019   Ankylosing spondylitis ([place])    Arthritis    BENIGN PROSTATIC HYPERTROPHY 06/07/2008   CHF (congestive heart failure) ([place])    COLONIC POLYPS, HX OF 06/07/2008   Depression    H/O hiatal hernia    Heart failure ([place])    HYPERLIPIDEMIA 06/07/2008   HYPERTENSION 06/07/2008   Myocardial infarction [facility]) 2005   NSTEMI, s/p LAD stent   NEPHROLITHIASIS, HX OF 06/07/2008   STEMI (ST elevation myocardial infarction) ([place]) 04/27/2019    Current Outpatient Medications  Medication Sig Dispense Refill   acetaminophen (TYLENOL) 325 MG tablet Take 1-2 tablets (325-650 mg total) by mouth every 4 (four) hours as needed for mild pain.     aspirin 81 MG chewable tablet Chew 1 tablet (81 mg total)  by mouth daily. 30 tablet 3   atorvastatin (LIPITOR) 80 MG tablet TAKE 1 TABLET BY MOUTH ONCE DAILY AT 6PM. 90 tablet 3   Carboxymethylcellulose Sodium (ARTIFICIAL TEARS OP) Place 1 drop into both eyes every other day.     carvedilol (COREG) 6.25 MG tablet TAKE 1 TABLET BY MOUTH TWICE A DAY 180 tablet 3   citalopram (CELEXA) 10 MG tablet Take 10 mg by mouth as needed.     clopidogrel (PLAVIX) 75 MG tablet STOP EFFIENT. WAIT 24 HOURS. TAKE 300 MG OF PLAVIX ONCE. THEN  TAKE 75 MG BY MOUTH DAILY. 90 tablet 3   dapagliflozin propanediol (FARXIGA) 10 MG TABS tablet Take 1 tablet (10 mg total) by mouth daily. 90 tablet 3   diphenhydrAMINE (BENADRYL) 25 MG tablet Take 25 mg by mouth once a week.     ENTRESTO 24-26 MG TAKE 1 TABLET BY MOUTH 2 (TWO) TIMES DAILY. 180 tablet 3   eplerenone (INSPRA) 50 MG tablet Take 50 mg by mouth 2 (two) times daily.     ezetimibe (ZETIA) 10 MG tablet TAKE ONE TABLET BY MOUTH DAILY 90 tablet 3   furosemide (LASIX) 40 MG tablet Take 1 tablet (40 mg total) by mouth daily. 90 tablet 3   inFLIXimab in sodium chloride 0.9 % Inject into the vein every 6 (six) weeks.     tamsulosin (FLOMAX) 0.4 MG CAPS capsule TAKE 1 CAPSULE(0.4 MG) BY MOUTH DAILY 90 capsule 1   umeclidinium bromide (INCRUSE ELLIPTA) 62.5 MCG/INH AEPB Inhale 1 puff into the lungs daily. 30 each 3   ivabradine (CORLANOR) 5 MG TABS tablet Take 5 mg 1.5 hours prior to your CT scan appointment time. Bring the second tablet with you, but do not take unless directed. 2 tablet 0   No current facility-administered medications for this visit.    ALLERGIES:   Patient has no known allergies.   SOCIAL HISTORY:  The patient  reports that he quit smoking about 15 months ago. His smoking use included cigarettes. He has a 40.00 pack-year smoking history. He has never used smokeless tobacco. He reports current alcohol use of about 7.0 standard drinks of alcohol per week. He reports current drug use.   FAMILY HISTORY:  The patient's family history includes Arthritis in his sister; Depression in his father; Heart failure in his mother.   REVIEW OF SYSTEMS:  Positive for Fatigue, nausea, low back pain.   All other systems are reviewed and negative.   PHYSICAL EXAM: VS:  BP (!) 88/60   Pulse 90   Ht 6' (1.829 m)   Wt 187 lb 12.8 oz (85.2 kg)   SpO2 92%   BMI 25.47 kg/m  , BMI Body mass index is 25.47 kg/m. GEN: Well nourished, well developed, in no acute distress HEENT: normal Neck:  No JVD. carotids 2+ with bilateral bruits or masses Cardiac: The heart is regular rate and rhythm with grade 3/6 harsh crescendo decrescendo murmur heard throughout, absent A2 no edema. Pedal pulses 2+ = bilaterally  Respiratory:  clear to auscultation bilaterally GI: soft, nontender, nondistended, + BS MS: no deformity or atrophy Skin: warm and dry, no rash Neuro:  Strength and sensation are intact Psych: euthymic mood, full affect  EKG:  EKG from today reviewed and demonstrates normal sinus rhythm, marked artifact limits interpretation, heart rate 90 bpm, ST-T wave abnormality consider anterolateral ischemia  RECENT LABS: 12/23/2019: ALT 19 03/23/2020: NT-Pro BNP 520 11/01/2020: B Natriuretic Peptide 462.1; BUN 10; Creatinine, Ser 0.82; Hemoglobin  14.2; Platelets 238; Potassium 4.1; Sodium 131  04/04/2020: Chol/HDL Ratio 2.5; Cholesterol, Total 140; HDL 57; LDL Chol Calc (NIH) 69; Triglycerides 72   Estimated Creatinine Clearance: 95.9 mL/min (by C-G formula based on SCr of 0.82 mg/dL).   Wt Readings from Last 3 Encounters:  11/15/20 187 lb 12.8 oz (85.2 kg)  11/01/20 195 lb 3.2 oz (88.5 kg)  07/06/20 191 lb 3.2 oz (86.7 kg)     CARDIAC STUDIES Echo: 11/01/2020: IMPRESSIONS     1. Left ventricular ejection fraction, by estimation, is 20 to 25%. The  left ventricle has severely decreased function. The left ventricle  demonstrates regional wall motion abnormalities (see scoring  diagram/findings for description). The left  ventricular internal cavity size was mildly dilated. There is mild left  ventricular hypertrophy. Left ventricular diastolic parameters are  consistent with Grade II diastolic dysfunction (pseudonormalization).  There is akinesis of the left ventricular,  entire anteroseptal wall, anterior wall, anterolateral wall and apical  segment.   2. Right ventricular systolic function is mildly reduced. The right  ventricular size is normal. There is mildly elevated  pulmonary artery  systolic pressure.   3. Left atrial size was moderately dilated.   4. The mitral valve is degenerative. Mild mitral valve regurgitation. No  evidence of mitral stenosis.   5. Likely low-gradient, low flow moderate to severe AS with DI 0.34  Aortic valve mean gradient measures 14.3 mmHg. Aortic valve peak gradient  measures 24.3 mmHg. Aortic valve area, by VTI measures 1.44 cm. The  aortic valve is abnormal in structure.  There is severe calcifcation of the aortic valve. Aortic valve  regurgitation is not visualized. Moderate to severe aortic valve stenosis.   6. The inferior vena cava is normal in size with greater than 50%  respiratory variability, suggesting right atrial pressure of 3 mmHg.   FINDINGS   Left Ventricle: Left ventricular ejection fraction, by estimation, is 20  to 25%. The left ventricle has severely decreased function. The left  ventricle demonstrates regional wall motion abnormalities. The left  ventricular internal cavity size was mildly   dilated. There is mild left ventricular hypertrophy. Left ventricular  diastolic parameters are consistent with Grade II diastolic dysfunction  (pseudonormalization).   Right Ventricle: The right ventricular size is normal. No increase in  right ventricular wall thickness. Right ventricular systolic function is  mildly reduced. There is mildly elevated pulmonary artery systolic  pressure. The tricuspid regurgitant velocity   is 3.09 m/s, and with an assumed right atrial pressure of 3 mmHg, the  estimated right ventricular systolic pressure is 93.8 mmHg.   Left Atrium: Left atrial size was moderately dilated.   Right Atrium: Right atrial size was normal in size.   Pericardium: There is no evidence of pericardial effusion.   Mitral Valve: The mitral valve is degenerative in appearance. There is  mild thickening of the mitral valve leaflet(s). Mild mitral annular  calcification. Mild mitral valve  regurgitation. No evidence of mitral  valve stenosis.   Tricuspid Valve: The tricuspid valve is normal in structure. Tricuspid  valve regurgitation is mild . No evidence of tricuspid stenosis.   Aortic Valve: Likely low-gradient, low flow moderate to severe AS with DI  0.34.  The aortic valve is abnormal. There is severe calcifcation of the aortic  valve. Aortic valve regurgitation is not visualized. Moderate to severe  aortic stenosis is present. Aortic valve mean gradient measures 14.3 mmHg.  Aortic valve peak gradient  measures 24.3 mmHg. Aortic  valve area, by VTI measures 1.44 cm.   Pulmonic Valve: The pulmonic valve was grossly normal. Pulmonic valve  regurgitation is not visualized. No evidence of pulmonic stenosis.   Aorta: The aortic root is normal in size and structure.   Venous: The inferior vena cava is normal in size with greater than 50%  respiratory variability, suggesting right atrial pressure of 3 mmHg.   IAS/Shunts: No atrial level shunt detected by color flow Doppler.   Additional Comments: A device lead is visualized.      LEFT VENTRICLE  PLAX 2D  LVIDd:         5.20 cm      Diastology  LVIDs:         4.00 cm      LV e' medial:    6.53 cm/s  LV PW:         1.30 cm      LV E/e' medial:  16.4  LV IVS:        1.20 cm      LV e' lateral:   7.62 cm/s  LVOT diam:     2.30 cm      LV E/e' lateral: 14.0  LV SV:         69  LV SV Index:   33  LVOT Area:     4.15 cm     LV Volumes (MOD)  LV vol d, MOD A4C: 186.0 ml  LV vol s, MOD A4C: 135.0 ml  LV SV MOD A4C:     186.0 ml   RIGHT VENTRICLE  RV Basal diam:  3.80 cm  RV S prime:     12.00 cm/s  TAPSE (M-mode): 1.7 cm   LEFT ATRIUM             Index       RIGHT ATRIUM           Index  LA diam:        4.20 cm 2.01 cm/m  RA Area:     15.60 cm  LA Vol (A2C):   46.0 ml 22.01 ml/m RA Volume:   43.40 ml  20.76 ml/m  LA Vol (A4C):   44.3 ml 21.19 ml/m  LA Biplane Vol: 45.6 ml 21.82 ml/m   AORTIC VALVE  AV  Area (Vmax):    1.51 cm  AV Area (Vmean):   1.37 cm  AV Area (VTI):     1.44 cm  AV Vmax:           246.33 cm/s  AV Vmean:          175.333 cm/s  AV VTI:            0.478 m  AV Peak Grad:      24.3 mmHg  AV Mean Grad:      14.3 mmHg  LVOT Vmax:         89.40 cm/s  LVOT Vmean:        57.800 cm/s  LVOT VTI:          0.166 m  LVOT/AV VTI ratio: 0.35     AORTA  Ao Root diam: 3.40 cm   MITRAL VALVE                TRICUSPID VALVE  MV Area (PHT): 5.27 cm     TR Peak grad:   38.2 mmHg  MV Decel Time: 144 msec     TR Vmax:  309.00 cm/s  MV E velocity: 107.00 cm/s  MV A velocity: 91.70 cm/s   SHUNTS  MV E/A ratio:  1.17         Systemic VTI:  0.17 m                              Systemic Diam: 2.30 cm   Cardiac Cath: 04/27/2019 Coronary Findings   Diagnostic Dominance: Right  Left Anterior Descending  Mid LAD lesion is 100% stenosed. Vessel is the culprit lesion. The lesion was previously treated.  Left Circumflex  Prox Cx lesion is 50% stenosed.  Right Coronary Artery  Prox RCA to Mid RCA lesion is 100% stenosed.  Right Posterior Descending Artery  Collaterals  RPDA filled by collaterals from 2nd Sept.     Intervention   Mid LAD lesion  Stent  CATHETER LAUNCHER 6FR EBU3.5 guide catheter was inserted. Lesion crossed with guidewire using a WIRE HI TORQ BMW 190CM. Pre-stent angioplasty was performed using a BALLOON SAPPHIRE 2.5X12. A drug-eluting stent was successfully placed using a STENT SYNERGY DES 3.5X16. Stent strut is well apposed. Post-stent angioplasty was performed using a BALLOON SAPPHIRE [place] 4.0X8.  Post-Intervention Lesion Assessment  The intervention was successful. Pre-interventional TIMI flow is 0. Post-intervention TIMI flow is 3. No complications occurred at this lesion.  There is a 0% residual stenosis post intervention.    Left Heart  Left Ventricle The left ventricular size is in the upper limits of normal. LV end diastolic pressure is moderately  elevated.  Aortic Valve There is no aortic valve stenosis.   Coronary Diagrams   Diagnostic Dominance: Right    Intervention     STS RISK CALCULATOR: Isolated AVR: Risk of Mortality: 1.216% Renal Failure: 0.906% Permanent Stroke: 0.701% Prolonged Ventilation: 7.776% DSW Infection: 0.117% Reoperation: 3.822% Morbidity or Mortality: 11.177% Short Length of Stay: 41.495% Long Length of Stay: 4.844%  ASSESSMENT AND PLAN: 64.  65 year old male with progressive symptoms of acute on chronic systolic heart failure, New York Heart Association functional class IIIb in the setting of at least moderately severe stage D2 low-flow low gradient aortic stenosis.  I have reviewed the natural history of aortic stenosis with the patient and their family members who are present today. We have discussed the limitations of medical therapy and the poor prognosis associated with symptomatic aortic stenosis. We have reviewed potential treatment options, including palliative medical therapy, conventional surgical aortic valve replacement, and transcatheter aortic valve replacement. We discussed treatment options in the context of the patient's specific comorbid medical conditions.    The patient's 2D echo images are personally reviewed. There is very severe LV dysfunction with mild LV dilatation. The entire LAD territory is akinetic and the LVEF is 20-25%. The aortic valve is severely calcified and restricted, but the mean gradient is only 14 mmHg. The dimensionless index is 0.34.  We had specific discussion today about treatment options in the setting of his worsening heart failure.  The question is whether TAVR is indicated, or whether the patient requires advanced heart failure therapies such as a ventricular assist device.  He clearly needs treatment as he is failing optimal medical therapy and very close outpatient care in the advanced heart failure clinic.  I think it is unclear at this point  whether TAVR will make a meaningful impact.  I have recommended proceeding with a right and left heart catheterization to better assess his intracardiac hemodynamics, offer another assessment of  aortic stenosis severity, and relook at his coronary anatomy. I have reviewed the risks, indications, and alternatives to cardiac catheterization, possible angioplasty, and stenting with the patient. Risks include but are not limited to bleeding, infection, vascular injury, stroke, myocardial infection, arrhythmia, kidney injury, radiation-related injury in the case of prolonged fluoroscopy use, emergency cardiac surgery, and death. The patient understands the risks of serious complication is 1-2 in 4818 with diagnostic cardiac cath and 1-2% or less with angioplasty/stenting.  We will try to arrange this procedure with Dr. [name] next week.  In addition, I have recommended a gated CTA of the heart as well as a CTA of the chest, abdomen, and pelvis.  Aortic valve calcium score can be helpful in determining aortic stenosis severity in patients with low-flow low gradient aortic stenosis.  In addition, the studies will help [place] understand whether he would be a candidate for transfemoral TAVR which would be the lowest risk treatment option for him.  Deatra James 11/18/2020 7:18 AM     [facility] [address] [place] [address]  [phone] (office) [phone] (fax)

## 2020-11-16 ENCOUNTER — Other Ambulatory Visit: Payer: Self-pay

## 2020-11-17 ENCOUNTER — Ambulatory Visit (HOSPITAL_COMMUNITY)
Admission: RE | Admit: 2020-11-17 | Discharge: 2020-11-17 | Disposition: A | Discharge status: Home / Self Care | Payer: Medicare Other | Source: Ambulatory Visit | Attending: Internal Medicine | Admitting: Internal Medicine

## 2020-11-17 ENCOUNTER — Other Ambulatory Visit: Payer: Self-pay

## 2020-11-17 DIAGNOSIS — I5022 Chronic systolic (congestive) heart failure: Secondary | ICD-10-CM

## 2020-11-17 DIAGNOSIS — R0602 Shortness of breath: Secondary | ICD-10-CM | POA: Diagnosis present

## 2020-11-17 LAB — PULMONARY FUNCTION TEST
DL/VA % pred: 68 %
DL/VA: 2.79 ml/min/mmHg/L
DLCO cor % pred: 61 %
DLCO cor: 17.17 ml/min/mmHg
DLCO unc % pred: 60 %
DLCO unc: 16.97 ml/min/mmHg
FEF 25-75 Pre: 1.51 L/sec
FEF2575-%Pred-Pre: 53 %
FEV1-%Pred-Pre: 72 %
FEV1-Pre: 2.63 L
FEV1FVC-%Pred-Pre: 92 %
FEV6-%Pred-Pre: 81 %
FEV6-Pre: 3.76 L
FEV6FVC-%Pred-Pre: 102 %
FVC-%Pred-Pre: 78 %
FVC-Pre: 3.85 L
Pre FEV1/FVC ratio: 68 %
Pre FEV6/FVC Ratio: 98 %
RV % pred: 165 %
RV: 4.13 L
TLC % pred: 106 %
TLC: 7.92 L

## 2020-11-17 MED ORDER — ALBUTEROL SULFATE (2.5 MG/3ML) 0.083% IN NEBU: 2.5000 mg | INHALATION_SOLUTION | Freq: Once | RESPIRATORY_TRACT | Status: DC

## 2020-11-18 ENCOUNTER — Encounter: Payer: Self-pay | Admitting: Cardiovascular Disease

## 2020-11-21 ENCOUNTER — Encounter (HOSPITAL_COMMUNITY): Payer: Self-pay

## 2020-11-21 ENCOUNTER — Ambulatory Visit (HOSPITAL_COMMUNITY): Payer: Medicare Other

## 2020-11-21 ENCOUNTER — Other Ambulatory Visit (HOSPITAL_COMMUNITY): Payer: Self-pay | Admitting: Family Medicine

## 2020-11-21 ENCOUNTER — Ambulatory Visit (HOSPITAL_COMMUNITY)
Admission: RE | Admit: 2020-11-21 | Discharge: 2020-11-21 | Disposition: A | Discharge status: Home / Self Care | Payer: Medicare Other | Source: Ambulatory Visit | Attending: Cardiovascular Disease | Admitting: Cardiovascular Disease

## 2020-11-21 ENCOUNTER — Other Ambulatory Visit: Payer: Self-pay

## 2020-11-21 DIAGNOSIS — I350 Nonrheumatic aortic (valve) stenosis: Secondary | ICD-10-CM

## 2020-11-21 MED ORDER — METOPROLOL TARTRATE 5 MG/5ML IV SOLN
INTRAVENOUS | Status: AC
  Filled 2020-11-21: qty 5

## 2020-11-21 MED ORDER — IOHEXOL 350 MG/ML SOLN
100.0000 mL | Freq: Once | INTRAVENOUS | Status: AC | PRN
  Administered 2020-11-21: 100 mL via INTRAVENOUS

## 2020-11-21 MED ORDER — METOPROLOL TARTRATE 5 MG/5ML IV SOLN
5.0000 mg | INTRAVENOUS | Status: DC | PRN
  Administered 2020-11-21: 5 mg via INTRAVENOUS

## 2020-11-21 NOTE — Progress Notes (Signed)
At 10:53 Dr. Audie Box was consulted due to heavy interference in wave form due to ICD and Biostem implant. The patient received 5 mg of metoprolol via peripheral site. No distress noted. Scanned patient.

## 2020-11-28 ENCOUNTER — Telehealth: Payer: Self-pay | Admitting: *Deleted

## 2020-11-28 ENCOUNTER — Encounter (INDEPENDENT_AMBULATORY_CARE_PROVIDER_SITE_OTHER): Payer: Medicare Other | Admitting: Ophthalmology

## 2020-11-28 NOTE — Telephone Encounter (Addendum)
Pt contacted pre-catheterization scheduled at [facility] for: Wednesday November 29, 2020 8:30 AM Verified arrival time and place: [facility]) at: 6:30 AM   No solid food after midnight prior to cath, clear liquids until 5 AM day of procedure.  Hold: Lasix/Inspra-AM of procedure Farxiga-AM of procedure  Except hold medications AM meds can be  taken pre-cath with sips of water including: aspirin 81 mg Plavix 75 mg  Confirmed patient has responsible adult to drive home post procedure and be with patient first 24 hours after arriving home: yes  You are allowed ONE visitor in the waiting room during the time you are at the hospital for your procedure. Both you and your visitor must wear a mask once you enter the hospital.   Patient reports does not currently have any symptoms concerning for COVID-19 and no household members with COVID-19 like illness.     Reviewed procedure/mask/visitor instructions with patient.

## 2020-11-29 ENCOUNTER — Inpatient Hospital Stay (HOSPITAL_COMMUNITY): Payer: Medicare Other

## 2020-11-29 ENCOUNTER — Encounter (HOSPITAL_COMMUNITY)
Admission: RE | Disposition: A | Discharge status: Home Health | Payer: Self-pay | Source: Home / Self Care | Attending: Internal Medicine

## 2020-11-29 ENCOUNTER — Inpatient Hospital Stay (HOSPITAL_COMMUNITY)
Admission: RE | Admit: 2020-11-29 | Discharge: 2020-12-11 | DRG: 853 | Disposition: A | Discharge status: Home Health | Payer: Medicare Other | Attending: Internal Medicine | Admitting: Internal Medicine

## 2020-11-29 ENCOUNTER — Other Ambulatory Visit: Payer: Self-pay

## 2020-11-29 ENCOUNTER — Ambulatory Visit (HOSPITAL_COMMUNITY): Payer: Medicare Other

## 2020-11-29 ENCOUNTER — Encounter (HOSPITAL_COMMUNITY): Payer: Self-pay | Admitting: Internal Medicine

## 2020-11-29 ENCOUNTER — Ambulatory Visit (INDEPENDENT_AMBULATORY_CARE_PROVIDER_SITE_OTHER): Payer: Medicare Other

## 2020-11-29 DIAGNOSIS — R079 Chest pain, unspecified: Secondary | ICD-10-CM

## 2020-11-29 DIAGNOSIS — A419 Sepsis, unspecified organism: Secondary | ICD-10-CM | POA: Diagnosis not present

## 2020-11-29 DIAGNOSIS — I82621 Acute embolism and thrombosis of deep veins of right upper extremity: Secondary | ICD-10-CM | POA: Diagnosis not present

## 2020-11-29 DIAGNOSIS — J9601 Acute respiratory failure with hypoxia: Secondary | ICD-10-CM

## 2020-11-29 DIAGNOSIS — J9621 Acute and chronic respiratory failure with hypoxia: Secondary | ICD-10-CM | POA: Diagnosis not present

## 2020-11-29 DIAGNOSIS — M7989 Other specified soft tissue disorders: Secondary | ICD-10-CM | POA: Diagnosis not present

## 2020-11-29 DIAGNOSIS — J432 Centrilobular emphysema: Secondary | ICD-10-CM

## 2020-11-29 DIAGNOSIS — R6521 Severe sepsis with septic shock: Secondary | ICD-10-CM | POA: Diagnosis present

## 2020-11-29 DIAGNOSIS — Z9911 Dependence on respirator [ventilator] status: Secondary | ICD-10-CM | POA: Diagnosis not present

## 2020-11-29 DIAGNOSIS — L89151 Pressure ulcer of sacral region, stage 1: Secondary | ICD-10-CM | POA: Diagnosis present

## 2020-11-29 DIAGNOSIS — E871 Hypo-osmolality and hyponatremia: Secondary | ICD-10-CM | POA: Diagnosis present

## 2020-11-29 DIAGNOSIS — Z79899 Other long term (current) drug therapy: Secondary | ICD-10-CM

## 2020-11-29 DIAGNOSIS — T827XXA Infection and inflammatory reaction due to other cardiac and vascular devices, implants and grafts, initial encounter: Secondary | ICD-10-CM | POA: Diagnosis not present

## 2020-11-29 DIAGNOSIS — J90 Pleural effusion, not elsewhere classified: Secondary | ICD-10-CM

## 2020-11-29 DIAGNOSIS — N401 Enlarged prostate with lower urinary tract symptoms: Secondary | ICD-10-CM | POA: Diagnosis present

## 2020-11-29 DIAGNOSIS — J690 Pneumonitis due to inhalation of food and vomit: Secondary | ICD-10-CM | POA: Diagnosis present

## 2020-11-29 DIAGNOSIS — I110 Hypertensive heart disease with heart failure: Secondary | ICD-10-CM | POA: Diagnosis present

## 2020-11-29 DIAGNOSIS — E872 Acidosis: Secondary | ICD-10-CM | POA: Diagnosis present

## 2020-11-29 DIAGNOSIS — I350 Nonrheumatic aortic (valve) stenosis: Secondary | ICD-10-CM

## 2020-11-29 DIAGNOSIS — I255 Ischemic cardiomyopathy: Secondary | ICD-10-CM | POA: Diagnosis present

## 2020-11-29 DIAGNOSIS — A408 Other streptococcal sepsis: Secondary | ICD-10-CM | POA: Diagnosis present

## 2020-11-29 DIAGNOSIS — K047 Periapical abscess without sinus: Secondary | ICD-10-CM

## 2020-11-29 DIAGNOSIS — D84821 Immunodeficiency due to drugs: Secondary | ICD-10-CM | POA: Diagnosis present

## 2020-11-29 DIAGNOSIS — R338 Other retention of urine: Secondary | ICD-10-CM | POA: Diagnosis present

## 2020-11-29 DIAGNOSIS — R778 Other specified abnormalities of plasma proteins: Secondary | ICD-10-CM | POA: Clinically undetermined

## 2020-11-29 DIAGNOSIS — D649 Anemia, unspecified: Secondary | ICD-10-CM | POA: Diagnosis present

## 2020-11-29 DIAGNOSIS — I358 Other nonrheumatic aortic valve disorders: Secondary | ICD-10-CM

## 2020-11-29 DIAGNOSIS — K8020 Calculus of gallbladder without cholecystitis without obstruction: Secondary | ICD-10-CM

## 2020-11-29 DIAGNOSIS — J9602 Acute respiratory failure with hypercapnia: Secondary | ICD-10-CM | POA: Diagnosis present

## 2020-11-29 DIAGNOSIS — Z20822 Contact with and (suspected) exposure to covid-19: Secondary | ICD-10-CM | POA: Diagnosis present

## 2020-11-29 DIAGNOSIS — Z9289 Personal history of other medical treatment: Secondary | ICD-10-CM | POA: Diagnosis not present

## 2020-11-29 DIAGNOSIS — R570 Cardiogenic shock: Secondary | ICD-10-CM | POA: Diagnosis present

## 2020-11-29 DIAGNOSIS — L8990 Pressure ulcer of unspecified site, unspecified stage: Secondary | ICD-10-CM | POA: Insufficient documentation

## 2020-11-29 DIAGNOSIS — K029 Dental caries, unspecified: Secondary | ICD-10-CM

## 2020-11-29 DIAGNOSIS — T17908A Unspecified foreign body in respiratory tract, part unspecified causing other injury, initial encounter: Secondary | ICD-10-CM | POA: Diagnosis not present

## 2020-11-29 DIAGNOSIS — Z87891 Personal history of nicotine dependence: Secondary | ICD-10-CM

## 2020-11-29 DIAGNOSIS — R609 Edema, unspecified: Secondary | ICD-10-CM | POA: Diagnosis not present

## 2020-11-29 DIAGNOSIS — B955 Unspecified streptococcus as the cause of diseases classified elsewhere: Secondary | ICD-10-CM

## 2020-11-29 DIAGNOSIS — K8050 Calculus of bile duct without cholangitis or cholecystitis without obstruction: Secondary | ICD-10-CM | POA: Diagnosis not present

## 2020-11-29 DIAGNOSIS — I352 Nonrheumatic aortic (valve) stenosis with insufficiency: Secondary | ICD-10-CM | POA: Diagnosis present

## 2020-11-29 DIAGNOSIS — J9690 Respiratory failure, unspecified, unspecified whether with hypoxia or hypercapnia: Secondary | ICD-10-CM

## 2020-11-29 DIAGNOSIS — Z981 Arthrodesis status: Secondary | ICD-10-CM

## 2020-11-29 DIAGNOSIS — I700 Atherosclerosis of aorta: Secondary | ICD-10-CM | POA: Diagnosis present

## 2020-11-29 DIAGNOSIS — J9600 Acute respiratory failure, unspecified whether with hypoxia or hypercapnia: Secondary | ICD-10-CM

## 2020-11-29 DIAGNOSIS — R7989 Other specified abnormal findings of blood chemistry: Secondary | ICD-10-CM | POA: Diagnosis present

## 2020-11-29 DIAGNOSIS — I340 Nonrheumatic mitral (valve) insufficiency: Secondary | ICD-10-CM | POA: Diagnosis not present

## 2020-11-29 DIAGNOSIS — I2510 Atherosclerotic heart disease of native coronary artery without angina pectoris: Secondary | ICD-10-CM | POA: Diagnosis present

## 2020-11-29 DIAGNOSIS — M069 Rheumatoid arthritis, unspecified: Secondary | ICD-10-CM | POA: Diagnosis present

## 2020-11-29 DIAGNOSIS — Z7982 Long term (current) use of aspirin: Secondary | ICD-10-CM

## 2020-11-29 DIAGNOSIS — I5022 Chronic systolic (congestive) heart failure: Secondary | ICD-10-CM | POA: Diagnosis present

## 2020-11-29 DIAGNOSIS — R7881 Bacteremia: Secondary | ICD-10-CM | POA: Diagnosis not present

## 2020-11-29 DIAGNOSIS — Z8261 Family history of arthritis: Secondary | ICD-10-CM

## 2020-11-29 DIAGNOSIS — T827XXD Infection and inflammatory reaction due to other cardiac and vascular devices, implants and grafts, subsequent encounter: Secondary | ICD-10-CM | POA: Diagnosis not present

## 2020-11-29 DIAGNOSIS — M1990 Unspecified osteoarthritis, unspecified site: Secondary | ICD-10-CM | POA: Diagnosis present

## 2020-11-29 DIAGNOSIS — I959 Hypotension, unspecified: Secondary | ICD-10-CM

## 2020-11-29 DIAGNOSIS — J449 Chronic obstructive pulmonary disease, unspecified: Secondary | ICD-10-CM | POA: Diagnosis present

## 2020-11-29 DIAGNOSIS — E785 Hyperlipidemia, unspecified: Secondary | ICD-10-CM | POA: Diagnosis present

## 2020-11-29 DIAGNOSIS — Z955 Presence of coronary angioplasty implant and graft: Secondary | ICD-10-CM

## 2020-11-29 DIAGNOSIS — T80219D Unspecified infection due to central venous catheter, subsequent encounter: Secondary | ICD-10-CM | POA: Diagnosis not present

## 2020-11-29 DIAGNOSIS — T80219A Unspecified infection due to central venous catheter, initial encounter: Secondary | ICD-10-CM

## 2020-11-29 DIAGNOSIS — E440 Moderate protein-calorie malnutrition: Secondary | ICD-10-CM | POA: Diagnosis present

## 2020-11-29 DIAGNOSIS — Z95810 Presence of automatic (implantable) cardiac defibrillator: Secondary | ICD-10-CM | POA: Diagnosis not present

## 2020-11-29 DIAGNOSIS — R000 Tachycardia, unspecified: Secondary | ICD-10-CM | POA: Diagnosis not present

## 2020-11-29 DIAGNOSIS — I252 Old myocardial infarction: Secondary | ICD-10-CM

## 2020-11-29 DIAGNOSIS — Z09 Encounter for follow-up examination after completed treatment for conditions other than malignant neoplasm: Secondary | ICD-10-CM

## 2020-11-29 DIAGNOSIS — Z7902 Long term (current) use of antithrombotics/antiplatelets: Secondary | ICD-10-CM

## 2020-11-29 DIAGNOSIS — E876 Hypokalemia: Secondary | ICD-10-CM | POA: Diagnosis present

## 2020-11-29 DIAGNOSIS — M79601 Pain in right arm: Secondary | ICD-10-CM | POA: Diagnosis not present

## 2020-11-29 DIAGNOSIS — T17908D Unspecified foreign body in respiratory tract, part unspecified causing other injury, subsequent encounter: Secondary | ICD-10-CM | POA: Diagnosis not present

## 2020-11-29 DIAGNOSIS — Z6825 Body mass index (BMI) 25.0-25.9, adult: Secondary | ICD-10-CM

## 2020-11-29 DIAGNOSIS — Z8249 Family history of ischemic heart disease and other diseases of the circulatory system: Secondary | ICD-10-CM

## 2020-11-29 DIAGNOSIS — R0600 Dyspnea, unspecified: Secondary | ICD-10-CM

## 2020-11-29 HISTORY — PX: RIGHT/LEFT HEART CATH AND CORONARY ANGIOGRAPHY: CATH118266

## 2020-11-29 LAB — CBC
HCT: 36.6 % — ABNORMAL LOW (ref 39.0–52.0)
HCT: 38.3 % — ABNORMAL LOW (ref 39.0–52.0)
Hemoglobin: 11.8 g/dL — ABNORMAL LOW (ref 13.0–17.0)
Hemoglobin: 12.2 g/dL — ABNORMAL LOW (ref 13.0–17.0)
MCH: 28.2 pg (ref 26.0–34.0)
MCH: 28.5 pg (ref 26.0–34.0)
MCHC: 32.2 g/dL (ref 30.0–36.0)
MCHC: 31.9 g/dL (ref 30.0–36.0)
MCV: 87.4 fL (ref 80.0–100.0)
MCV: 89.5 fL (ref 80.0–100.0)
Platelets: 274 10*3/uL (ref 150–400)
Platelets: 342 10*3/uL (ref 150–400)
RBC: 4.19 MIL/uL — ABNORMAL LOW (ref 4.22–5.81)
RBC: 4.28 MIL/uL (ref 4.22–5.81)
RDW: 14.2 % (ref 11.5–15.5)
RDW: 14.3 % (ref 11.5–15.5)
WBC: 11.1 10*3/uL — ABNORMAL HIGH (ref 4.0–10.5)
WBC: 24.1 10*3/uL — ABNORMAL HIGH (ref 4.0–10.5)
nRBC: 0 % (ref 0.0–0.2)
nRBC: 0 % (ref 0.0–0.2)

## 2020-11-29 LAB — POCT I-STAT 7, (LYTES, BLD GAS, ICA,H+H)
Acid-base deficit: 4 mmol/L — ABNORMAL HIGH (ref 0.0–2.0)
Acid-base deficit: 7 mmol/L — ABNORMAL HIGH (ref 0.0–2.0)
Bicarbonate: 21.2 mmol/L (ref 20.0–28.0)
Bicarbonate: 22.9 mmol/L (ref 20.0–28.0)
Calcium, Ion: 1.18 mmol/L (ref 1.15–1.40)
Calcium, Ion: 1.2 mmol/L (ref 1.15–1.40)
HCT: 38 % — ABNORMAL LOW (ref 39.0–52.0)
HCT: 39 % (ref 39.0–52.0)
Hemoglobin: 12.9 g/dL — ABNORMAL LOW (ref 13.0–17.0)
Hemoglobin: 13.3 g/dL (ref 13.0–17.0)
O2 Saturation: 88 %
O2 Saturation: 99 %
Potassium: 3.5 mmol/L (ref 3.5–5.1)
Potassium: 3.5 mmol/L (ref 3.5–5.1)
Sodium: 133 mmol/L — ABNORMAL LOW (ref 135–145)
Sodium: 133 mmol/L — ABNORMAL LOW (ref 135–145)
TCO2: 23 mmol/L (ref 22–32)
TCO2: 24 mmol/L (ref 22–32)
pCO2 arterial: 47.9 mmHg (ref 32.0–48.0)
pCO2 arterial: 51.6 mmHg — ABNORMAL HIGH (ref 32.0–48.0)
pH, Arterial: 7.222 — ABNORMAL LOW (ref 7.350–7.450)
pH, Arterial: 7.288 — ABNORMAL LOW (ref 7.350–7.450)
pO2, Arterial: 160 mmHg — ABNORMAL HIGH (ref 83.0–108.0)
pO2, Arterial: 66 mmHg — ABNORMAL LOW (ref 83.0–108.0)

## 2020-11-29 LAB — PROCALCITONIN: Procalcitonin: <0.1 ng/mL

## 2020-11-29 LAB — LACTATE DEHYDROGENASE: LDH: 143 U/L (ref 98–192)

## 2020-11-29 LAB — CUP PACEART REMOTE DEVICE CHECK
Battery Remaining Longevity: 106 mo
Battery Remaining Percentage: 86 %
Battery Voltage: 2.99 V
Brady Statistic RV Percent Paced: 1 %
Date Time Interrogation Session: 20220720030743
HighPow Impedance: 71 Ohm
Implantable Lead Implant Date: 20210420
Implantable Lead Location: 753860
Implantable Pulse Generator Implant Date: 20210420
Lead Channel Impedance Value: 400 Ohm
Lead Channel Pacing Threshold Amplitude: 0.5 V
Lead Channel Pacing Threshold Pulse Width: 0.5 ms
Lead Channel Sensing Intrinsic Amplitude: 11.6 mV
Lead Channel Setting Pacing Amplitude: 2.5 V
Lead Channel Setting Pacing Pulse Width: 0.5 ms
Lead Channel Setting Sensing Sensitivity: 0.5 mV
Pulse Gen Serial Number: 111013950

## 2020-11-29 LAB — COMPREHENSIVE METABOLIC PANEL
ALT: 56 U/L — ABNORMAL HIGH (ref 0–44)
AST: 36 U/L (ref 15–41)
Albumin: 3 g/dL — ABNORMAL LOW (ref 3.5–5.0)
Alkaline Phosphatase: 71 U/L (ref 38–126)
Anion gap: 12 (ref 5–15)
BUN: 11 mg/dL (ref 8–23)
CO2: 24 mmol/L (ref 22–32)
Calcium: 8.7 mg/dL — ABNORMAL LOW (ref 8.9–10.3)
Chloride: 97 mmol/L — ABNORMAL LOW (ref 98–111)
Creatinine, Ser: 0.89 mg/dL (ref 0.61–1.24)
GFR, Estimated: >60 mL/min (ref >60)
Glucose, Bld: 111 mg/dL — ABNORMAL HIGH (ref 70–99)
Potassium: 2.9 mmol/L — ABNORMAL LOW (ref 3.5–5.1)
Sodium: 133 mmol/L — ABNORMAL LOW (ref 135–145)
Total Bilirubin: 1.1 mg/dL (ref 0.3–1.2)
Total Protein: 7.4 g/dL (ref 6.5–8.1)

## 2020-11-29 LAB — BASIC METABOLIC PANEL
Anion gap: 11 (ref 5–15)
BUN: 11 mg/dL (ref 8–23)
CO2: 21 mmol/L — ABNORMAL LOW (ref 22–32)
Calcium: 7.5 mg/dL — ABNORMAL LOW (ref 8.9–10.3)
Chloride: 98 mmol/L (ref 98–111)
Creatinine, Ser: 1.05 mg/dL (ref 0.61–1.24)
GFR, Estimated: >60 mL/min (ref >60)
Glucose, Bld: 207 mg/dL — ABNORMAL HIGH (ref 70–99)
Potassium: 2.6 mmol/L — CL (ref 3.5–5.1)
Sodium: 130 mmol/L — ABNORMAL LOW (ref 135–145)

## 2020-11-29 LAB — LACTIC ACID, PLASMA
Lactic Acid, Venous: 2.3 mmol/L (ref 0.5–1.9)
Lactic Acid, Venous: 3.8 mmol/L (ref 0.5–1.9)

## 2020-11-29 LAB — MRSA NEXT GEN BY PCR, NASAL: MRSA by PCR Next Gen: NOT DETECTED

## 2020-11-29 LAB — BRAIN NATRIURETIC PEPTIDE: B Natriuretic Peptide: 1586.1 pg/mL — ABNORMAL HIGH (ref 0.0–100.0)

## 2020-11-29 SURGERY — RIGHT/LEFT HEART CATH AND CORONARY ANGIOGRAPHY
Anesthesia: LOCAL

## 2020-11-29 MED ORDER — METOPROLOL TARTRATE 5 MG/5ML IV SOLN
INTRAVENOUS | Status: AC
  Administered 2020-11-29: 2.5 mg via INTRAVENOUS
  Filled 2020-11-29: qty 5

## 2020-11-29 MED ORDER — SODIUM CHLORIDE 0.9 % IV BOLUS
250.0000 mL | Freq: Once | INTRAVENOUS | Status: AC
  Administered 2020-11-29: 250 mL via INTRAVENOUS

## 2020-11-29 MED ORDER — ENOXAPARIN SODIUM 40 MG/0.4ML IJ SOSY
40.0000 mg | PREFILLED_SYRINGE | INTRAMUSCULAR | Status: DC
Start: 2020-11-29 — End: 2020-11-29

## 2020-11-29 MED ORDER — SODIUM CHLORIDE 0.9% FLUSH: 3.0000 mL | Freq: Two times a day (BID) | INTRAVENOUS | Status: DC

## 2020-11-29 MED ORDER — ONDANSETRON HCL 4 MG/2ML IJ SOLN
4.0000 mg | Freq: Four times a day (QID) | INTRAMUSCULAR | Status: DC | PRN
  Administered 2020-12-03 – 2020-12-04 (×2): 4 mg via INTRAVENOUS
  Filled 2020-11-29 (×2): qty 2

## 2020-11-29 MED ORDER — SODIUM CHLORIDE 0.9% FLUSH: 3.0000 mL | INTRAVENOUS | Status: DC | PRN

## 2020-11-29 MED ORDER — SODIUM CHLORIDE 0.9 % IV SOLN
2.0000 g | Freq: Once | INTRAVENOUS | Status: AC
  Administered 2020-11-29: 2 g via INTRAVENOUS
  Filled 2020-11-29: qty 20

## 2020-11-29 MED ORDER — LIDOCAINE HCL (PF) 1 % IJ SOLN
INTRAMUSCULAR | Status: AC
  Filled 2020-11-29: qty 30

## 2020-11-29 MED ORDER — ETOMIDATE 2 MG/ML IV SOLN: 20.0000 mg | Freq: Once | INTRAVENOUS | Status: AC

## 2020-11-29 MED ORDER — HEPARIN SODIUM (PORCINE) 1000 UNIT/ML IJ SOLN
INTRAMUSCULAR | Status: DC | PRN
  Administered 2020-11-29: 4000 [IU] via INTRAVENOUS

## 2020-11-29 MED ORDER — FENTANYL CITRATE (PF) 100 MCG/2ML IJ SOLN
25.0000 ug | INTRAMUSCULAR | Status: DC | PRN
  Administered 2020-12-02: 25 ug via INTRAVENOUS

## 2020-11-29 MED ORDER — ENOXAPARIN SODIUM 40 MG/0.4ML IJ SOSY
40.0000 mg | PREFILLED_SYRINGE | INTRAMUSCULAR | Status: DC
  Administered 2020-11-30 – 2020-12-03 (×4): 40 mg via SUBCUTANEOUS
  Filled 2020-11-29 (×4): qty 0.4

## 2020-11-29 MED ORDER — POTASSIUM CHLORIDE CRYS ER 20 MEQ PO TBCR
40.0000 meq | EXTENDED_RELEASE_TABLET | Freq: Two times a day (BID) | ORAL | Status: DC
  Administered 2020-11-29: 40 meq via ORAL
  Filled 2020-11-29 (×2): qty 2

## 2020-11-29 MED ORDER — MIDAZOLAM HCL 2 MG/2ML IJ SOLN
INTRAMUSCULAR | Status: AC
  Administered 2020-11-29: 2 mg via INTRAVENOUS
  Filled 2020-11-29: qty 2

## 2020-11-29 MED ORDER — ONDANSETRON HCL 4 MG/2ML IJ SOLN: 4.0000 mg | Freq: Four times a day (QID) | INTRAMUSCULAR | Status: DC | PRN

## 2020-11-29 MED ORDER — VANCOMYCIN HCL IN DEXTROSE 1-5 GM/200ML-% IV SOLN
1000.0000 mg | Freq: Two times a day (BID) | INTRAVENOUS | Status: DC
  Administered 2020-11-30: 1000 mg via INTRAVENOUS
  Filled 2020-11-29: qty 200

## 2020-11-29 MED ORDER — METOPROLOL TARTRATE 5 MG/5ML IV SOLN: 2.5000 mg | Freq: Once | INTRAVENOUS | Status: AC

## 2020-11-29 MED ORDER — ETOMIDATE 2 MG/ML IV SOLN
INTRAVENOUS | Status: AC
  Administered 2020-11-29: 20 mg via INTRAVENOUS
  Filled 2020-11-29: qty 20

## 2020-11-29 MED ORDER — SODIUM CHLORIDE 0.9 % IV SOLN: INTRAVENOUS | Status: DC

## 2020-11-29 MED ORDER — ONDANSETRON HCL 4 MG/2ML IJ SOLN
INTRAMUSCULAR | Status: AC
  Administered 2020-11-29: 4 mg
  Filled 2020-11-29: qty 2

## 2020-11-29 MED ORDER — ACETAMINOPHEN 325 MG PO TABS
650.0000 mg | ORAL_TABLET | ORAL | Status: DC | PRN
  Administered 2020-11-29 – 2020-12-04 (×4): 650 mg via ORAL
  Filled 2020-11-29 (×3): qty 2

## 2020-11-29 MED ORDER — METHYLPREDNISOLONE SODIUM SUCC 125 MG IJ SOLR
60.0000 mg | Freq: Once | INTRAMUSCULAR | Status: AC
  Administered 2020-11-29: 60 mg via INTRAVENOUS
  Filled 2020-11-29: qty 2

## 2020-11-29 MED ORDER — HEPARIN (PORCINE) IN NACL 1000-0.9 UT/500ML-% IV SOLN
INTRAVENOUS | Status: AC
  Filled 2020-11-29: qty 1000

## 2020-11-29 MED ORDER — SODIUM CHLORIDE 0.9% FLUSH
3.0000 mL | Freq: Two times a day (BID) | INTRAVENOUS | Status: DC
  Administered 2020-11-30 – 2020-12-11 (×17): 3 mL via INTRAVENOUS

## 2020-11-29 MED ORDER — FUROSEMIDE 10 MG/ML IJ SOLN: 40.0000 mg | Freq: Once | INTRAMUSCULAR | Status: DC

## 2020-11-29 MED ORDER — NOREPINEPHRINE 16 MG/250ML-% IV SOLN
2.0000 ug/min | INTRAVENOUS | Status: DC
  Administered 2020-11-29: 10 ug/min via INTRAVENOUS
  Filled 2020-11-29: qty 250

## 2020-11-29 MED ORDER — SODIUM CHLORIDE 0.9% FLUSH
3.0000 mL | INTRAVENOUS | Status: DC | PRN
  Administered 2020-12-08: 3 mL via INTRAVENOUS

## 2020-11-29 MED ORDER — NOREPINEPHRINE 4 MG/250ML-% IV SOLN
2.0000 ug/min | INTRAVENOUS | Status: DC
Start: 2020-11-29 — End: 2020-11-30
  Administered 2020-11-30: 10 ug/min via INTRAVENOUS
  Filled 2020-11-29: qty 250

## 2020-11-29 MED ORDER — SODIUM CHLORIDE 0.9 % IV SOLN
250.0000 mL | INTRAVENOUS | Status: DC | PRN
Start: 2020-11-29 — End: 2020-12-11

## 2020-11-29 MED ORDER — ACETAMINOPHEN 325 MG PO TABS: 650.0000 mg | ORAL_TABLET | ORAL | Status: DC | PRN

## 2020-11-29 MED ORDER — PHENYLEPHRINE HCL-NACL 10-0.9 MG/250ML-% IV SOLN
INTRAVENOUS | Status: AC
  Filled 2020-11-29: qty 250

## 2020-11-29 MED ORDER — LIDOCAINE HCL (PF) 1 % IJ SOLN
INTRAMUSCULAR | Status: DC | PRN
  Administered 2020-11-29 (×2): 2 mL

## 2020-11-29 MED ORDER — DEXMEDETOMIDINE HCL IN NACL 400 MCG/100ML IV SOLN
0.4000 ug/kg/h | INTRAVENOUS | Status: DC
  Administered 2020-11-29: 0.6 ug/kg/h via INTRAVENOUS
  Filled 2020-11-29: qty 100

## 2020-11-29 MED ORDER — FUROSEMIDE 10 MG/ML IJ SOLN
INTRAMUSCULAR | Status: AC
  Administered 2020-11-29: 20 mg via INTRAVENOUS
  Filled 2020-11-29: qty 4

## 2020-11-29 MED ORDER — VANCOMYCIN HCL IN DEXTROSE 1-5 GM/200ML-% IV SOLN
INTRAVENOUS | Status: AC
  Administered 2020-11-29: 1000 mg
  Filled 2020-11-29: qty 200

## 2020-11-29 MED ORDER — METOPROLOL TARTRATE 5 MG/5ML IV SOLN
INTRAVENOUS | Status: AC
  Filled 2020-11-29: qty 20

## 2020-11-29 MED ORDER — SODIUM CHLORIDE 0.9 % IV BOLUS
500.0000 mL | Freq: Once | INTRAVENOUS | Status: AC
  Administered 2020-11-29: 500 mL via INTRAVENOUS

## 2020-11-29 MED ORDER — VERAPAMIL HCL 2.5 MG/ML IV SOLN
INTRAVENOUS | Status: AC
  Filled 2020-11-29: qty 2

## 2020-11-29 MED ORDER — FUROSEMIDE 10 MG/ML IJ SOLN: 20.0000 mg | Freq: Once | INTRAMUSCULAR | Status: AC

## 2020-11-29 MED ORDER — VANCOMYCIN HCL 1500 MG/300ML IV SOLN
1500.0000 mg | Freq: Once | INTRAVENOUS | Status: DC
  Filled 2020-11-29: qty 300

## 2020-11-29 MED ORDER — VANCOMYCIN HCL IN DEXTROSE 1-5 GM/200ML-% IV SOLN: 1000.0000 mg | Freq: Once | INTRAVENOUS | Status: DC

## 2020-11-29 MED ORDER — SODIUM CHLORIDE 0.9 % IV SOLN: INTRAVENOUS | Status: AC

## 2020-11-29 MED ORDER — SODIUM CHLORIDE 0.9% FLUSH
3.0000 mL | Freq: Two times a day (BID) | INTRAVENOUS | Status: DC
  Administered 2020-11-29 – 2020-12-11 (×10): 3 mL via INTRAVENOUS

## 2020-11-29 MED ORDER — PANTOPRAZOLE SODIUM 40 MG IV SOLR
40.0000 mg | Freq: Every day | INTRAVENOUS | Status: DC
  Administered 2020-11-30: 40 mg via INTRAVENOUS
  Filled 2020-11-29: qty 40

## 2020-11-29 MED ORDER — ROCURONIUM BROMIDE 10 MG/ML (PF) SYRINGE
PREFILLED_SYRINGE | INTRAVENOUS | Status: AC
  Filled 2020-11-29: qty 10

## 2020-11-29 MED ORDER — CLOPIDOGREL BISULFATE 75 MG PO TABS
75.0000 mg | ORAL_TABLET | ORAL | Status: DC
  Filled 2020-11-29: qty 1

## 2020-11-29 MED ORDER — MIDAZOLAM HCL 2 MG/2ML IJ SOLN
INTRAMUSCULAR | Status: DC | PRN
  Administered 2020-11-29 (×2): 1 mg via INTRAVENOUS

## 2020-11-29 MED ORDER — VANCOMYCIN HCL IN DEXTROSE 1-5 GM/200ML-% IV SOLN: 1000.0000 mg | Freq: Two times a day (BID) | INTRAVENOUS | Status: DC

## 2020-11-29 MED ORDER — MIDAZOLAM HCL 2 MG/2ML IJ SOLN: 2.0000 mg | Freq: Once | INTRAMUSCULAR | Status: AC

## 2020-11-29 MED ORDER — FENTANYL CITRATE (PF) 100 MCG/2ML IJ SOLN
INTRAMUSCULAR | Status: DC | PRN
  Administered 2020-11-29: 25 ug via INTRAVENOUS

## 2020-11-29 MED ORDER — CHLORHEXIDINE GLUCONATE CLOTH 2 % EX PADS
6.0000 | MEDICATED_PAD | Freq: Every day | CUTANEOUS | Status: DC
  Administered 2020-11-29 – 2020-12-02 (×4): 6 via TOPICAL

## 2020-11-29 MED ORDER — VANCOMYCIN HCL IN DEXTROSE 1-5 GM/200ML-% IV SOLN
1000.0000 mg | Freq: Once | INTRAVENOUS | Status: AC
  Administered 2020-11-29: 1000 mg via INTRAVENOUS
  Filled 2020-11-29: qty 200

## 2020-11-29 MED ORDER — SODIUM CHLORIDE 0.9 % IV SOLN: 250.0000 mL | INTRAVENOUS | Status: DC | PRN

## 2020-11-29 MED ORDER — IOHEXOL 350 MG/ML SOLN
INTRAVENOUS | Status: DC | PRN
  Administered 2020-11-29: 50 mL

## 2020-11-29 MED ORDER — POTASSIUM CHLORIDE 10 MEQ/100ML IV SOLN
10.0000 meq | INTRAVENOUS | Status: AC
Start: 2020-11-30 — End: 2020-11-30
  Administered 2020-11-29 – 2020-11-30 (×4): 10 meq via INTRAVENOUS
  Filled 2020-11-29: qty 100

## 2020-11-29 MED ORDER — LABETALOL HCL 5 MG/ML IV SOLN: 10.0000 mg | INTRAVENOUS | Status: AC | PRN

## 2020-11-29 MED ORDER — ROCURONIUM BROMIDE 10 MG/ML (PF) SYRINGE
PREFILLED_SYRINGE | INTRAVENOUS | Status: AC
  Administered 2020-11-29: 50 mg via INTRAVENOUS
  Filled 2020-11-29: qty 10

## 2020-11-29 MED ORDER — PROPOFOL 1000 MG/100ML IV EMUL
0.0000 ug/kg/min | INTRAVENOUS | Status: DC
  Administered 2020-11-30 (×3): 35 ug/kg/min via INTRAVENOUS
  Administered 2020-11-30: 25 ug/kg/min via INTRAVENOUS
  Administered 2020-12-01: 10 ug/kg/min via INTRAVENOUS
  Administered 2020-12-01: 40 ug/kg/min via INTRAVENOUS
  Administered 2020-12-01: 30 ug/kg/min via INTRAVENOUS
  Filled 2020-11-29 (×8): qty 100

## 2020-11-29 MED ORDER — ASPIRIN 81 MG PO CHEW: 81.0000 mg | CHEWABLE_TABLET | ORAL | Status: DC

## 2020-11-29 MED ORDER — VASOPRESSIN 20 UNITS/100 ML INFUSION FOR SHOCK
0.0000 [IU]/min | INTRAVENOUS | Status: DC
  Administered 2020-11-30 – 2020-12-01 (×5): 0.03 [IU]/min via INTRAVENOUS
  Filled 2020-11-29 (×5): qty 100

## 2020-11-29 MED ORDER — NOREPINEPHRINE 4 MG/250ML-% IV SOLN
2.0000 ug/min | INTRAVENOUS | Status: DC
  Administered 2020-11-29: 2 ug/min via INTRAVENOUS
  Filled 2020-11-29: qty 250

## 2020-11-29 MED ORDER — SODIUM CHLORIDE 0.9 % IV SOLN
2.0000 g | INTRAVENOUS | Status: DC
  Filled 2020-11-29: qty 20

## 2020-11-29 MED ORDER — SODIUM CHLORIDE 0.9 % IV SOLN
250.0000 mL | INTRAVENOUS | Status: DC
  Administered 2020-11-29: 250 mL via INTRAVENOUS

## 2020-11-29 MED ORDER — HEPARIN SODIUM (PORCINE) 1000 UNIT/ML IJ SOLN
INTRAMUSCULAR | Status: AC
  Filled 2020-11-29: qty 1

## 2020-11-29 MED ORDER — ROCURONIUM BROMIDE 10 MG/ML (PF) SYRINGE: 50.0000 mg | PREFILLED_SYRINGE | Freq: Once | INTRAVENOUS | Status: AC

## 2020-11-29 MED ORDER — VERAPAMIL HCL 2.5 MG/ML IV SOLN
INTRAVENOUS | Status: DC | PRN
  Administered 2020-11-29: 10 mL via INTRA_ARTERIAL

## 2020-11-29 MED ORDER — VANCOMYCIN HCL IN DEXTROSE 1-5 GM/200ML-% IV SOLN
1000.0000 mg | Freq: Once | INTRAVENOUS | Status: DC
  Filled 2020-11-29: qty 200

## 2020-11-29 MED ORDER — IPRATROPIUM-ALBUTEROL 0.5-2.5 (3) MG/3ML IN SOLN
RESPIRATORY_TRACT | Status: AC
  Filled 2020-11-29: qty 3

## 2020-11-29 MED ORDER — ONDANSETRON HCL 4 MG/2ML IJ SOLN
4.0000 mg | Freq: Once | INTRAMUSCULAR | Status: AC
  Filled 2020-11-29: qty 2

## 2020-11-29 MED ORDER — VANCOMYCIN HCL 500 MG/100ML IV SOLN
500.0000 mg | Freq: Once | INTRAVENOUS | Status: AC
  Administered 2020-11-29: 500 mg via INTRAVENOUS
  Filled 2020-11-29: qty 100

## 2020-11-29 MED ORDER — POLYETHYLENE GLYCOL 3350 17 G PO PACK
17.0000 g | PACK | Freq: Every day | ORAL | Status: DC
  Administered 2020-11-30 – 2020-12-02 (×3): 17 g
  Filled 2020-11-29 (×3): qty 1

## 2020-11-29 MED ORDER — FENTANYL CITRATE (PF) 100 MCG/2ML IJ SOLN
INTRAMUSCULAR | Status: AC
  Filled 2020-11-29: qty 2

## 2020-11-29 MED ORDER — DOCUSATE SODIUM 50 MG/5ML PO LIQD
100.0000 mg | Freq: Two times a day (BID) | ORAL | Status: DC
  Administered 2020-11-30 – 2020-12-02 (×5): 100 mg
  Filled 2020-11-29 (×5): qty 10

## 2020-11-29 MED ORDER — FENTANYL CITRATE (PF) 100 MCG/2ML IJ SOLN
25.0000 ug | INTRAMUSCULAR | Status: DC | PRN
  Administered 2020-11-29: 100 ug via INTRAVENOUS
  Filled 2020-11-29: qty 2

## 2020-11-29 MED ORDER — MIDAZOLAM HCL 2 MG/2ML IJ SOLN
INTRAMUSCULAR | Status: AC
  Filled 2020-11-29: qty 2

## 2020-11-29 MED ORDER — HYDRALAZINE HCL 20 MG/ML IJ SOLN: 10.0000 mg | INTRAMUSCULAR | Status: AC | PRN

## 2020-11-29 MED ORDER — PHENYLEPHRINE 40 MCG/ML (10ML) SYRINGE FOR IV PUSH (FOR BLOOD PRESSURE SUPPORT)
PREFILLED_SYRINGE | INTRAVENOUS | Status: AC
  Filled 2020-11-29: qty 10

## 2020-11-29 MED ORDER — HEPARIN (PORCINE) IN NACL 1000-0.9 UT/500ML-% IV SOLN
INTRAVENOUS | Status: DC | PRN
  Administered 2020-11-29 (×2): 500 mL

## 2020-11-29 SURGICAL SUPPLY — 12 items
CATH 5FR JL3.5 JR4 ANG PIG MP (CATHETERS) ×2 IMPLANT
CATH INFINITI 5FR AL1 (CATHETERS) ×2 IMPLANT
CATH SWAN DBL LUMAN 5F 110 (CATHETERS) ×2 IMPLANT
DEVICE RAD COMP TR BAND LRG (VASCULAR PRODUCTS) ×2 IMPLANT
GLIDESHEATH SLEND SS 6F .021 (SHEATH) ×2 IMPLANT
GUIDEWIRE INQWIRE 1.5J.035X260 (WIRE) ×1 IMPLANT
INQWIRE 1.5J .035X260CM (WIRE) ×2
PACK CARDIAC CATHETERIZATION (CUSTOM PROCEDURE TRAY) ×2 IMPLANT
SHEATH GLIDE SLENDER 4/5FR (SHEATH) ×2 IMPLANT
SHEATH PROBE COVER 6X72 (BAG) ×2 IMPLANT
TRANSDUCER W/STOPCOCK (MISCELLANEOUS) ×2 IMPLANT
WIRE EMERALD ST .035X150CM (WIRE) ×2 IMPLANT

## 2020-11-29 NOTE — Progress Notes (Addendum)
Pt c/o nausea, and states"I can't breath" O2 applied  Shaking noted. Rapid response called. Dr [name] at bedside.Pt sister at bedside she stated that she was going to the waiting area.place pt on stretcher attempted  to take temp pt shaking unable to obtained temp at present. Placed on heart monitor. Pt vomiting unable to lie still. Lab and CXR called as per orders.

## 2020-11-29 NOTE — Progress Notes (Signed)
Rapid response here Dr [name] here. EKG complete.

## 2020-11-29 NOTE — Progress Notes (Addendum)
   Called by nursing staff for recurrent Sinus Tach 140s   Cultures obtained earlier and started antibiotics.   Recurrent N/V. Narrow complex similar to earlier today 140s. Sinus Tach. Temp 102.3  BP 116/85  Sats < 88%.   Placed on NRB given 20 mg IV lasix x1, zofran, and tylenol.   Considered ivabradine but with nausea will give 2.5 mg metoprolol now.   Venissa Nappi NP-C  6:41 PM

## 2020-11-29 NOTE — Progress Notes (Signed)
TR band removed and gauze with transparent dressing applied.  Board already in place. No bleeding noted.

## 2020-11-29 NOTE — Procedures (Signed)
Intubation Procedure Note  Brandon [name]  [id number]  06-Aug-1955  Date:11/29/20  Time:11:20 PM   Provider Performing:Bascom Biel R Kinjal Neitzke    Procedure: Intubation (31500)  Indication(s) Respiratory Failure  Consent Risks of the procedure as well as the alternatives and risks of each were explained to the patient and/or caregiver.  Consent for the procedure was obtained and is signed in the bedside chart   Anesthesia Etomidate, Versed, and Rocuronium   Time Out Verified patient identification, verified procedure, site/side was marked, verified correct patient position, special equipment/implants available, medications/allergies/relevant history reviewed, required imaging and test results available.   Sterile Technique Usual hand hygeine, masks, and gloves were used   Procedure Description Patient positioned in bed supine.  Sedation given as noted above.  Patient was intubated with endotracheal tube using Glidescope.  View was Grade 1 full glottis .  Number of attempts was 1.  Colorimetric CO2 detector was consistent with tracheal placement.   Complications/Tolerance None; patient tolerated the procedure well. Chest X-ray is ordered to verify placement.   EBL Minimal   Specimen(s) None   Brandon [name] Brandon Hayashi, PA-C [facility] See Amion for pager If no response to pager , please call [phone] until 7pm After 7:00 pm call Elink  [phone]

## 2020-11-29 NOTE — Significant Event (Signed)
Rapid Response Event Note   Reason for Call :  Patient sp cardiac cath.  Shivering, distressed, HR 140s.  Initial Focused Assessment:  Patient is alert and oriented.  He has rigors and chills.  He is mildly anxious and restless.  He is frequently vomiting small/moderate amounts brown emesis. Skin mildly pale.  Warm to the touch.  Manual BP 140/60  HR 130-140  RR 28  O2 sat 99% on NRB Axillary temp 100.8  Dr [name] at bedside to assess patient   Interventions:  EKG:  poor quality because of shivering Device interrogated 750 cc NS bolus given Labs drawn 2nd piv placed Abx started  After about an hour shivering improved, patient calm  BP 85/50 - 103/61 St 115-120s RR 20 O2 sat 97% on 2L [place]    Plan of Care:  Transfer to ICU   Event Summary:   MD Notified: Bensimhon Call Time:  1214 Arrival Time: 1215 End Time: [place]  Raliegh Ip, RN

## 2020-11-29 NOTE — H&P (Addendum)
Advanced Heart Failure Team History and Physical Note   PCP:  [name], MD  PCP-Cardiology: [name], MD     Reason for Admission:  Acute Hypoxic Respiratory Failure/Sepsis   HPI:   Brandon [name] is a 65 year old with a history of h/o CAD s/p previous MI, HTN, HL, St Jude ICD, S/P Barostim, and chronic systolic heart failure.    Admitted 04/27/19 with anterior ST elevation. Taken to cath lab which showed chronically occluded RCA (L>>R collaterals) and thrombotic occlusion of proximal LAD. Underwent PCI of LAD. Echo w/ reduced LVEF 30-35% w/ apical aneurysm. RV ok. Post cath, he required milrinone for cardiogenic shock.   Now s/p Barostim.  Saw Dr [name] In June and he described symptoms as NYHA III. Reds Clip 30%. Concerned he was headed toward advanced therapies. Also referred to Dr Burt Knack for TAVR.   Saw Dr Burt Knack for TAVR consideration. Work up has been started.   Presented for scheduled RHC/LHC today. Cath showed low filling pressures, stable 2V CAD, severe ICM EF 20%, moderately reduced cardiac output. He returned to the short stay and had been there for ~ 3 hours then developed chills/ nausea/vomiting/dyspnea. Oxygen sats dropped into the low 70s. Monitor showed tachycaria. ST Jude interrogated and this showed Sinus Tach. Temp 102.8 . Given zofran and a total of 750 cc NS. Of note, he says has had N/V everyday for the last 6 weeks.   CXR & Blood Cx obtained.   RHC/LHC 11/29/20 Prox Cx lesion is 50% stenosed.   Prox RCA to Mid RCA lesion is 100% stenosed.   Ost RCA to Prox RCA lesion is 50% stenosed.   Non-stenotic Mid LAD lesion was previously treated.  Ao = 81/50 (63) LV = 99/12 RA = 4 RV = 24/6 PA = 19/5 (14) PCW = 3 Fick cardiac output/index = 4.7/2.3 PVR = 2.3 WU Ao sat = 99% PA sat = 69%, 70%  Assessment: Stable 2v CAD Severe iCM EF 20% Low filling pressures with moderately reduced cardiac output Heavily calcified aortic valve (difficult to  cross). Appears to be moderate by pressures but may have component of low-gradient, low-flow AS  Previous Cardiac Studies  ECHO 11/01/20: EF 20-25% RV mildly HK. Moderate to severe low-flow AS DI 0.34 Aortic valve mean gradient measures 14.3 mmHg. Aortic valve peak gradient measures 24.3 mmHg. Aortic valve area, by VTI measures 1.44 cm.  Echo 05/19/20 EF 20-25% RV mildly HK. moderate AS  Mean gradient 13 AVA 1.2 cm2DI 0.30  ECHO 04/27/19 EF 30-35% RV normal.  Moderate HK LV. Grade IDD. ECHO 10/29/19 EF 25-30% RV ok. Personally reviewed   Review of Systems: [y] = yes, [ ]  = no   General: Weight gain [ ] ; Weight loss [ ] ; Anorexia [ ] ; Fatigue [ Y]; Fever [Y ]; Chills [ Y]; Weakness [ Y]  Cardiac: Chest pain/pressure [ ] ; Resting SOB [ ] ; Exertional SOB [ Y]; Orthopnea [ ] ; Pedal Edema [ ] ; Palpitations [ ] ; Syncope [ ] ; Presyncope [ ] ; Paroxysmal nocturnal dyspnea[ ]   Pulmonary: Cough [ ] ; Wheezing[ ] ; Hemoptysis[ ] ; Sputum [ ] ; Snoring [ ]   GI: Vomiting[ Y]; Dysphagia[ ] ; Melena[ ] ; Hematochezia [ ] ; Heartburn[ ] ; Abdominal pain [ ] ; Constipation [ ] ; Diarrhea [ ] ; BRBPR [ ]   GU: Hematuria[ ] ; Dysuria [ ] ; Nocturia[ ]   Vascular: Pain in legs with walking [ ] ; Pain in feet with lying flat [ ] ; Non-healing sores [ ] ; Stroke [ ] ; TIA [ ] ;  Slurred speech [ ] ;  Neuro: Headaches[ ] ; Vertigo[ ] ; Seizures[ ] ; Paresthesias[ ] ;Blurred vision [ ] ; Diplopia [ ] ; Vision changes [ ]   Ortho/Skin: Arthritis [ ] ; Joint pain [ ] ; Muscle pain [ ] ; Joint swelling [ ] ; Back Pain [ ] ; Rash [ ]   Psych: Depression[ ] ; Anxiety[ ]   Heme: Bleeding problems [ ] ; Clotting disorders [ ] ; Anemia [ ]   Endocrine: Diabetes [ ] ; Thyroid dysfunction[ ]    Home Medications Prior to Admission medications   Medication Sig Start Date End Date Taking? Authorizing Provider  acetaminophen (TYLENOL) 500 MG tablet Take 1,000 mg by mouth every 6 (six) hours as needed (pain.).   Yes [provider]  aspirin 81 MG chewable tablet  Chew 1 tablet (81 mg total) by mouth daily. Patient taking differently: Chew 81 mg by mouth in the morning. 05/05/19  Yes Sivan Quast, Shaune Pascal, MD  atorvastatin (LIPITOR) 80 MG tablet TAKE 1 TABLET BY MOUTH ONCE DAILY AT 6PM. Patient taking differently: Take 80 mg by mouth daily at 6 PM. 06/19/20  Yes Hilty, Nadean Corwin, MD  Carboxymethylcellulose Sodium (ARTIFICIAL TEARS OP) Place 1 drop into both eyes daily as needed (dry/irritated eyes.).   Yes [provider]  carvedilol (COREG) 6.25 MG tablet TAKE 1 TABLET BY MOUTH TWICE A DAY Patient taking differently: Take 6.25 mg by mouth in the morning and at bedtime. 11/01/20  Yes Shubh Chiara, Shaune Pascal, MD  citalopram (CELEXA) 10 MG tablet Take 10 mg by mouth 2 (two) times a week.   Yes [provider]  clopidogrel (PLAVIX) 75 MG tablet STOP EFFIENT. WAIT 24 HOURS. TAKE 300 MG OF PLAVIX ONCE. THEN TAKE 75 MG BY MOUTH DAILY. Patient taking differently: Take 75 mg by mouth in the morning. STOP EFFIENT. WAIT 24 HOURS. TAKE 300 MG OF PLAVIX ONCE. THEN TAKE 75 MG BY MOUTH DAILY. 07/31/20  Yes Shirley Friar, PA-C  dapagliflozin propanediol (FARXIGA) 10 MG TABS tablet Take 1 tablet (10 mg total) by mouth daily. Patient taking differently: Take 10 mg by mouth in the morning. 09/19/20  Yes Sayge Salvato, Shaune Pascal, MD  diphenhydrAMINE (BENADRYL) 25 MG tablet Take 50 mg by mouth at bedtime.   Yes [provider]  ENTRESTO 24-26 MG TAKE 1 TABLET BY MOUTH 2 (TWO) TIMES DAILY. Patient taking differently: Take 1 tablet by mouth 2 (two) times daily. 10/28/19  Yes Jag Lenz, Shaune Pascal, MD  eplerenone (INSPRA) 50 MG tablet Take 50 mg by mouth in the morning.   Yes [provider]  ezetimibe (ZETIA) 10 MG tablet TAKE ONE TABLET BY MOUTH DAILY Patient taking differently: Take 10 mg by mouth in the morning. 05/30/20  Yes Lelon Perla, MD  furosemide (LASIX) 40 MG tablet Take 1 tablet (40 mg total) by mouth daily. Patient taking differently:  Take 40 mg by mouth in the morning. 11/15/20  Yes Sherren Mocha, MD  inFLIXimab in sodium chloride 0.9 % Inject into the vein every 6 (six) weeks.   Yes [provider]  tamsulosin (FLOMAX) 0.4 MG CAPS capsule TAKE 1 CAPSULE(0.4 MG) BY MOUTH DAILY Patient taking differently: Take 0.4 mg by mouth in the morning. 10/03/20  Yes [name], MD  umeclidinium bromide (INCRUSE ELLIPTA) 62.5 MCG/INH AEPB Inhale 1 puff into the lungs daily. Patient taking differently: Inhale 1 puff into the lungs daily as needed (respiratory issues.). 08/10/20  Yes [name], MD  ivabradine (CORLANOR) 5 MG TABS tablet Take 5 mg 1.5 hours prior to your CT  scan appointment time. Bring the second tablet with you, but do not take unless directed. Patient not taking: Reported on 11/24/2020 11/15/20   Sherren Mocha, MD    Past Medical History: Past Medical History:  Diagnosis Date   AICD (automatic cardioverter/defibrillator) present 08/31/2019   Ankylosing spondylitis ([place])    Arthritis    BENIGN PROSTATIC HYPERTROPHY 06/07/2008   CHF (congestive heart failure) ([place])    COLONIC POLYPS, HX OF 06/07/2008   Depression    H/O hiatal hernia    Heart failure ([place])    HYPERLIPIDEMIA 06/07/2008   HYPERTENSION 06/07/2008   Myocardial infarction [facility]) 2005   NSTEMI, s/p LAD stent   NEPHROLITHIASIS, HX OF 06/07/2008   STEMI (ST elevation myocardial infarction) ([place]) 04/27/2019    Past Surgical History: Past Surgical History:  Procedure Laterality Date   COLONOSCOPY WITH PROPOFOL N/A 04/24/2020   Procedure: COLONOSCOPY WITH PROPOFOL;  Surgeon: [name], MD;  Location: [facility];  Service: Gastroenterology;  Laterality: N/A;   CORONARY ANGIOPLASTY WITH STENT PLACEMENT  2005   LAD stent, jailed diagonal   CORONARY/GRAFT ACUTE MI REVASCULARIZATION N/A 04/27/2019   Procedure: Coronary/Graft Acute MI Revascularization;  Surgeon: [name], MD;  Location: [place] [facility];  Service:  Cardiovascular;  Laterality: N/A;   ICD IMPLANT  08/31/2019   ICD IMPLANT N/A 08/31/2019   Procedure: ICD IMPLANT;  Surgeon: [name], MD;  Location: [place] [facility];  Service: Cardiovascular;  Laterality: N/A;   LAMINECTOMY     lumbar   LEFT HEART CATH AND CORONARY ANGIOGRAPHY N/A 04/27/2019   Procedure: LEFT HEART CATH AND CORONARY ANGIOGRAPHY;  Surgeon: [name], MD;  Location: [place] [facility];  Service: Cardiovascular;  Laterality: N/A;   LUMBAR FUSION  01/2012   POLYPECTOMY  04/24/2020   Procedure: POLYPECTOMY;  Surgeon: [name], MD;  Location: [facility];  Service: Gastroenterology;;   RIGHT HEART CATH N/A 04/27/2019   Procedure: RIGHT HEART CATH;  Surgeon: [name], King M, MD;  Location: [place] [facility];  Service: Cardiovascular;  Laterality: N/A;   TONSILLECTOMY     warthin tumor removal Left 2014    Family History:  Family History  Problem Relation Age of Onset   Depression Father    Arthritis Sister        RA   Heart failure Mother    Other Neg Hx    Colon cancer Neg Hx    Esophageal cancer Neg Hx    Rectal cancer Neg Hx    Stomach cancer Neg Hx    Colon polyps Neg Hx     Social History: Social History   Socioeconomic History   Marital status: Single    Spouse name: Not on file   Number of children: Not on file   Years of education: Not on file   Highest education level: Not on file  Occupational History   Occupation: Best boy: [employer]  Tobacco Use   Smoking status: Former    Packs/day: 1.00    Years: 40.00    Pack years: 40.00    Types: Cigarettes    Quit date: 07/26/2019    Years since quitting: 1.3   Smokeless tobacco: Never   Tobacco comments:    1-1.5 pks per year x 40-45 yrs  Vaping Use   Vaping Use: Never used  Substance and Sexual Activity   Alcohol use: Yes    Alcohol/week: 7.0 standard drinks    Types: 7  Shots of liquor per week    Comment: "2 vodka tonics a night"   Drug use: Yes     Comment: occasionally - CBD oil   Sexual activity: Not on file  Other Topics Concern   Not on file  Social History Narrative   Lives in [place] alone   Works part time for a [employer] in [place] Strain: Low Risk    Difficulty of Paying Living Expenses: Not hard at all  Food Insecurity: No Food Insecurity   Worried About Charity fundraiser in the Last Year: Never true   Arboriculturist in the Last Year: Never true  Transportation Needs: No Transportation Needs   Lack of Transportation (Medical): No   Lack of Transportation (Non-Medical): No  Physical Activity: Sufficiently Active   Days of Exercise per Week: 5 days   Minutes of Exercise per Session: 40 min  Stress: No Stress Concern Present   Feeling of Stress : Not at all  Social Connections: Socially Isolated   Frequency of Communication with Friends and Family: More than three times a week   Frequency of Social Gatherings with Friends and Family: More than three times a week   Attends Religious Services: Never   Marine scientist or Organizations: No   Attends Music therapist: Never   Marital Status: Never married    Allergies:  No Known Allergies  Objective:    Vital Signs:   Temp:  [97.7 F (36.5 C)] 97.7 F (36.5 C) (07/20 0628) Pulse Rate:  [79-131] 88 (07/20 1100) Resp:  [12-25] 16 (07/20 1006) BP: (84-97)/(58-71) 89/58 (07/20 1100) SpO2:  [96 %-100 %] 100 % (07/20 1100) Weight:  [83 kg] 83 kg (07/20 0628)   Filed Weights   11/29/20 0628  Weight: 83 kg     Physical Exam     General:  Appears ill. Actively vomiting. Dyspneic.  HEENT: Normal Neck: Supple. JVP flat Carotids 2+ bilat; no bruits. No lymphadenopathy or thyromegaly appreciated. Cor: PMI nondisplaced. Tachy Regular rate & rhythm. No rubs, gallops or murmurs. Lungs: On NRB Abdomen: Soft, nontender, nondistended. No hepatosplenomegaly. No  bruits or masses. Good bowel sounds. Extremities: No cyanosis, clubbing, rash, edema Neuro: Alert & oriented x 3, cranial nerves grossly intact. moves all 4 extremities w/o difficulty.    Telemetry   Sinus Tach 130-140s   EKG   Difficult to interpret with chills.   Labs     Basic Metabolic Panel: No results for input(s): NA, K, CL, CO2, GLUCOSE, BUN, CREATININE, CALCIUM, MG, PHOS in the last 168 hours.  Liver Function Tests: No results for input(s): AST, ALT, ALKPHOS, BILITOT, PROT, ALBUMIN in the last 168 hours. No results for input(s): LIPASE, AMYLASE in the last 168 hours. No results for input(s): AMMONIA in the last 168 hours.  CBC: No results for input(s): WBC, NEUTROABS, HGB, HCT, MCV, PLT in the last 168 hours.  Cardiac Enzymes: No results for input(s): CKTOTAL, CKMB, CKMBINDEX, TROPONINI in the last 168 hours.  BNP: BNP (last 3 results) Recent Labs    11/01/20 1224  BNP 462.1*    ProBNP (last 3 results) Recent Labs    03/23/20 0922  PROBNP 520*     CBG: No results for input(s): GLUCAP in the last 168 hours.  Coagulation Studies: No results for input(s): LABPROT, INR in the last 72 hours.  Imaging: CARDIAC CATHETERIZATION  Result Date: 11/29/2020 Formatting  of this result is different from the original.   Prox Cx lesion is 50% stenosed.   Prox RCA to Mid RCA lesion is 100% stenosed.   Ost RCA to Prox RCA lesion is 50% stenosed.   Non-stenotic Mid LAD lesion was previously treated. Findings: Ao = 81/50 (63) LV = 99/12 RA = 4 RV = 24/6 PA = 19/5 (14) PCW = 3 Fick cardiac output/index = 4.7/2.3 PVR = 2.3 WU Ao sat = 99% PA sat = 69%, 70% Assessment: Stable 2v CAD Severe iCM EF 20% Low filling pressures with moderately reduced cardiac output Heavily calcified aortic valve (difficult to cross). Appears to be moderate by pressures but may have component of low-gradient, low-flow AS Plan/Discussion: Continue VAD w/u. Glori Bickers, MD 10:29 AM  CUP PACEART  REMOTE DEVICE CHECK  Result Date: 11/29/2020 Scheduled remote reviewed. Normal device function.  Corvue stable. Next remote 91 days. LR    Patient Profile  Brandon Flagg is a 65 year old with a history of h/o CAD s/p previous MI, HTN, HL, ST jude ICD, S/P Barostim, and chronic systolic heart failure.    Admitted after cath with fever, tachycardia, and acute hypoxemic respiratory failure.   Assessment/Plan   Acute Hypoxemic Respiratory Failure CXR now. Placed on NRB Sats improved.   2. Fever, possible sepsis  Has had nausea/vomiting for several weeks. Concerned for possible community acquired infection. Aggressively cover with ICD.  - Febrile 102.8  - Cath today with low filling pressures. Given 750 cc NS bolus.  Obtain blood CX x2 CBC, lactic acid, pro calcitonin now - Start vancomycin and ceftriaxone for possible community acquire infections - Check SARS 2  3. Tachycardia, narrow complex  ST Jude interrogated and appears Sinus Tach.  Given IV fluid/zofran   4.  Chronic Systolic Heart Failure, ICM  04/27/19 ECHO- EF post anterior MI 30 to 35%. - Echo 6/21 EF 25-30% RV ok Personally reviewed - Echo 05/19/20 EF 20-25% RV mildly HK. moderate AS  Mean gradient 13 AVA 1.2 cm2DI 0.30 - s/p ICD and has Barostim. - Had RHC today with low filling pressures. Hold eplerenone, entresto, farxiga, and carvedilol - Concerned he will need advanced therpaies. Likely not transplant candidate given age and lung disease.   5. CAD  s/p anterior STEMI 12/20. LHC showed chronically occluded RCA (with L>>R collaterals) and thrombotic occlusion of proximal LAD. Underwent PCI of LAD. -LHC today with stable CAD  -Need to continue aspirin. Could keep off plavix.   6. Aortic Stenosis Echo results reviewed in detail. Moderate to severe low gradient AS.  - Saw Dr Burt Knack for possible TAVR . Work has been started.   Dr [name] at bedside in short stay. Admit to ICU.    Darrick Grinder, NP 11/29/2020, 1:11  PM  Advanced Heart Failure Team Pager [phone] (M-F; 7a - 5p)  Please contact [facility] for night-coverage after hours (4p -7a ) and weekends on [email or web address]  Agree with above.   Patient developed severe rigors, n/v and fever to 102 several hours after cardiac catheterization while in short stay. BP was low and receive nearly 1 L of IVF. Says he has been having these episodes nearly daily for 6-8 weeks. Rapid response activated. Patient had labs drawn and moved to ICU. ICD interrogation showed sinus tach. Bcx drawn .  General:  Ill appearing. Severe rigors + nausea HEENT: normal Neck: supple. no JVD. Carotids 2+ bilat; no bruits. No lymphadenopathy or thryomegaly appreciated. Cor: PMI nondisplaced. Regular tachy 2/6  AS Lungs: clear Abdomen: soft, nontender, nondistended. No hepatosplenomegaly. No bruits or masses. Good bowel sounds. Extremities: no cyanosis, clubbing, rash, edema Neuro: alert & orientedx3, cranial nerves grossly intact. moves all 4 extremities w/o difficulty. Affect pleasant  Presentation concerning for sepsis, intermittent bacteremia. 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   Await bcx. Check ESR. Continue abx. Will use NE for BP support.   Eventually will work toward VAD for his HF.   CRITICAL CARE Performed by: Glori Bickers  Total critical care time: 55 minutes  Critical care time was exclusive of separately billable procedures and treating other patients.  Critical care was necessary to treat or prevent imminent or life-threatening deterioration.  Critical care was time spent personally by me (independent of midlevel providers or residents) on the following activities: development of treatment plan with patient and/or surrogate as well as nursing, discussions with consultants, evaluation of patient's response to treatment, examination of patient, obtaining history from patient or surrogate,  ordering and performing treatments and interventions, ordering and review of laboratory studies, ordering and review of radiographic studies, pulse oximetry and re-evaluation of patient's condition.  Glori Bickers, MD  4:40 PM

## 2020-11-29 NOTE — Consult Note (Signed)
NAME:  Brandon [name], MRN:  [id number], DOB:  1955-07-11, LOS: 0 ADMISSION DATE:  11/29/2020, CONSULTATION DATE:  11/30/20 REFERRING MD:  cardiology, CHIEF COMPLAINT:  respiratory failure   History of Present Illness:  65 y.o. M with PMH of HFrEF, CAD s/p previous MI with [facility] Jude ICD and barrel stem, hypertension, hyperlipidemia also under TAVR consideration who presented for scheduled right and left heart cath on 7/20, which showed stable two-vessel CAD , EF 20% and heavily calcified aortic valve.   After his procedure, he developed chills, nausea, vomiting and shortness of breath.  He reported nausea and vomiting for the last several weeks.  His temperature climbed to 102F and oxygen saturations dropped into the 70%'s along with sinus tachycardia.  He was admitted and placed on nonrebreather along with vancomycin and ceftriaxone, overnight he developed progressive respiratory distress and tachycardia, PCCM consulted and patient ultimately required intubation and vasopressor support  Pertinent  Medical History   has a past medical history of AICD (automatic cardioverter/defibrillator) present (08/31/2019), Ankylosing spondylitis ([place]), Arthritis, BENIGN PROSTATIC HYPERTROPHY (06/07/2008), CHF (congestive heart failure) ([place]), COLONIC POLYPS, HX OF (06/07/2008), Depression, H/O hiatal hernia, Heart failure ([place]), HYPERLIPIDEMIA (06/07/2008), HYPERTENSION (06/07/2008), Myocardial infarction ([place]) (2005), NEPHROLITHIASIS, HX OF (06/07/2008), and STEMI (ST elevation myocardial infarction) ([place]) (04/27/2019).  Significant Hospital Events: Including procedures, antibiotic start and stop dates in addition to other pertinent events   7/20 underwent right and left heart cath, postop decompensation and admission.  Vancomycin and ceftriaxone initiated 7/21 respiratory distress overnight along with shock, PCCM consulted and patient intubated along with placement of central line and arterial line  Interim History  / Subjective:  Rapid decompensation overnight requiring Levophed, epinephrine and vasopressin at one-point  Objective   Blood pressure (!) 83/62, pulse (!) 119, temperature 98.7 F (37.1 C), temperature source Oral, resp. rate (!) 21, height 6' (1.829 m), weight 83 kg, SpO2 95 %.        Intake/Output Summary (Last 24 hours) at 11/29/2020 2307 Last data filed at 11/29/2020 2000 Gross per 24 hour  Intake 173.66 ml  Output 1375 ml  Net -1201.34 ml   Filed Weights   11/29/20 0628  Weight: 83 kg   General: Acutely ill-appearing male in respiratory distress HEENT: MM pink/dry Neuro: Awake, able to answer questions with one-word answers, moving all extremities CV: s1s2 sinus tachycardia, no m/r/g PULM: Rales throughout bilaterally, accessory muscle use and tachypnea on nonrebreather GI: soft, bsx4 active  Extremities: warm/dry, no edema  Skin: no rashes or lesions   Resolved Hospital Problem list     Assessment & Plan:   Acute hypoxic respiratory failure Possibly secondary to flash pulmonary edema repeat CXR with widespread pulmonary edema or aspiration in the setting of vomiting Failed BiPAP and required intubation Bedside ultrasound did not appear to have acutely enlarged R heart, reducing suspicion for large PE P: -Maintain full vent support with SAT/SBT as tolerated -titrate Vent setting to maintain SpO2 greater than or equal to 90%. -HOB elevated 30 degrees. -Plateau pressures less than 30 cm H20.  -Follow chest x-ray, ABG prn.   -Bronchial hygiene and RT/bronchodilator protocol.   Shock, likely septic vs cardiogenic  Has had weeks of n/v per epic and pt's family, developed fever, chills, tachycardia leukocytosis and hypotension after admission Cholelithiasis noted on abdominal US, though initial LFT's and bili were unremarkable P: -coox pending -Follow blood cultures and continue empiric vancomycin and ceftriaxone -Central line placed and coox pending, blood  pressure did improve with  1 L IV fluid bolus -Check UA -LHC noted highly calcified aortic valve difficult to pass, cardiology MD overnight mentioned possible septic emboli that was displaced with cath -Started on Levophed, vasopressin and required epi for short period, improved and epinephrine weaned off, continue pressors to maintain MAP>65 -Closely follow volume status, was initially diuresed then received 1.5 L IV fluid overnight in the setting of possible septic shock -Lactic acid peaked at 3.8 and down trended -trend troponin, was 3500 overnight though EKG without clear ischemia   Nausea and vomiting Ongoing for several weeks per notes and family P: -check lipase -cholelithiasis noted on xray -check RUQ [place] and repeat LFT's  History of chronic systolic heart failure, ICM, AS S/P [employer] Jude ICD and Barostim Under consideration for TAVR and advanced therapies P: -Management per cardiology and advanced heart failure -Eplerenone, Entresto, farxiga and carvedilol held -continue Asa   Hypokalemia, hypomagnesemia, hypocalcemia K 2.6, Mag 1.6,  Ca 1.6 repleted overnight  Best Practice (right click and "Reselect all SmartList Selections" daily)   Diet/type: NPO DVT prophylaxis: LMWH GI prophylaxis: PPI Lines: Central line Foley:  Yes, and it is still needed Code Status:  full code Last date of multidisciplinary goals of care discussion [pending, sister updated by phone overnight ]  Labs   CBC: Recent Labs  Lab 11/29/20 1310 11/29/20 2221 11/29/20 2240  WBC 11.1*  --  24.1*  HGB 11.8* 13.3 12.2*  HCT 36.6* 39.0 38.3*  MCV 87.4  --  89.5  PLT 274  --  161    Basic Metabolic Panel: Recent Labs  Lab 11/29/20 1310 11/29/20 2221  NA 133* 133*  K 2.9* 3.5  CL 97*  --   CO2 24  --   GLUCOSE 111*  --   BUN 11  --   CREATININE 0.89  --   CALCIUM 8.7*  --    GFR: Estimated Creatinine Clearance: 88.4 mL/min (by C-G formula based on SCr of 0.89 mg/dL). Recent Labs   Lab 11/29/20 1310 11/29/20 1441 11/29/20 2240  PROCALCITON <0.10  --   --   WBC 11.1*  --  24.1*  LATICACIDVEN  --  2.3*  --     Liver Function Tests: Recent Labs  Lab 11/29/20 1310  AST 36  ALT 56*  ALKPHOS 71  BILITOT 1.1  PROT 7.4  ALBUMIN 3.0*   No results for input(s): LIPASE, AMYLASE in the last 168 hours. No results for input(s): AMMONIA in the last 168 hours.  ABG    Component Value Date/Time   PHART 7.222 (L) 11/29/2020 2221   PCO2ART 51.6 (H) 11/29/2020 2221   PO2ART 66 (L) 11/29/2020 2221   HCO3 21.2 11/29/2020 2221   TCO2 23 11/29/2020 2221   ACIDBASEDEF 7.0 (H) 11/29/2020 2221   O2SAT 88.0 11/29/2020 2221     Coagulation Profile: No results for input(s): INR, PROTIME in the last 168 hours.  Cardiac Enzymes: No results for input(s): CKTOTAL, CKMB, CKMBINDEX, TROPONINI in the last 168 hours.  HbA1C: Hgb A1c MFr Bld  Date/Time Value Ref Range Status  04/27/2019 08:36 PM 6.0 (H) 4.8 - 5.6 % Final    Comment:    (NOTE) Pre diabetes:          5.7%-6.4% Diabetes:              >6.4% Glycemic control for   <7.0% adults with diabetes   04/27/2019 01:00 PM 5.9 (H) 4.8 - 5.6 % Final    Comment:    (  NOTE) Pre diabetes:          5.7%-6.4% Diabetes:              >6.4% Glycemic control for   <7.0% adults with diabetes     CBG: No results for input(s): GLUCAP in the last 168 hours.  Review of Systems:   Unable to obtain secondary to intubation  Past Medical History:  He,  has a past medical history of AICD (automatic cardioverter/defibrillator) present (08/31/2019), Ankylosing spondylitis ([place]), Arthritis, BENIGN PROSTATIC HYPERTROPHY (06/07/2008), CHF (congestive heart failure) ([place]), COLONIC POLYPS, HX OF (06/07/2008), Depression, H/O hiatal hernia, Heart failure ([place]), HYPERLIPIDEMIA (06/07/2008), HYPERTENSION (06/07/2008), Myocardial infarction ([place]) (2005), NEPHROLITHIASIS, HX OF (06/07/2008), and STEMI (ST elevation myocardial infarction) ([place])  (04/27/2019).   Surgical History:   Past Surgical History:  Procedure Laterality Date   COLONOSCOPY WITH PROPOFOL N/A 04/24/2020   Procedure: COLONOSCOPY WITH PROPOFOL;  Surgeon: [name], MD;  Location: [facility];  Service: Gastroenterology;  Laterality: N/A;   CORONARY ANGIOPLASTY WITH STENT PLACEMENT  2005   LAD stent, jailed diagonal   CORONARY/GRAFT ACUTE MI REVASCULARIZATION N/A 04/27/2019   Procedure: Coronary/Graft Acute MI Revascularization;  Surgeon: [name], MD;  Location: [facility];  Service: Cardiovascular;  Laterality: N/A;   ICD IMPLANT  08/31/2019   ICD IMPLANT N/A 08/31/2019   Procedure: ICD IMPLANT;  Surgeon: [name], MD;  Location: [facility];  Service: Cardiovascular;  Laterality: N/A;   LAMINECTOMY     lumbar   LEFT HEART CATH AND CORONARY ANGIOGRAPHY N/A 04/27/2019   Procedure: LEFT HEART CATH AND CORONARY ANGIOGRAPHY;  Surgeon: [name], MD;  Location: [facility];  Service: Cardiovascular;  Laterality: N/A;   LUMBAR FUSION  01/2012   POLYPECTOMY  04/24/2020   Procedure: POLYPECTOMY;  Surgeon: [name], MD;  Location: [facility];  Service: Gastroenterology;;   RIGHT HEART CATH N/A 04/27/2019   Procedure: RIGHT HEART CATH;  Surgeon: [name], Keeton M, MD;  Location: [facility];  Service: Cardiovascular;  Laterality: N/A;   RIGHT/LEFT HEART CATH AND CORONARY ANGIOGRAPHY N/A 11/29/2020   Procedure: RIGHT/LEFT HEART CATH AND CORONARY ANGIOGRAPHY;  Surgeon: Jolaine Artist, MD;  Location: [facility];  Service: Cardiovascular;  Laterality: N/A;   TONSILLECTOMY     warthin tumor removal Left 2014     Social History:   reports that he quit smoking about 16 months ago. His smoking use included cigarettes. He has a 40.00 pack-year smoking history. He has never used smokeless tobacco. He reports current alcohol use of about 7.0 standard drinks of alcohol per week. He reports  current drug use.   Family History:  His family history includes Arthritis in his sister; Depression in his father; Heart failure in his mother. There is no history of Other, Colon cancer, Esophageal cancer, Rectal cancer, Stomach cancer, or Colon polyps.   Allergies No Known Allergies   Home Medications  Prior to Admission medications   Medication Sig Start Date End Date Taking? Authorizing Provider  acetaminophen (TYLENOL) 500 MG tablet Take 1,000 mg by mouth every 6 (six) hours as needed (pain.).   Yes [provider]  aspirin 81 MG chewable tablet Chew 1 tablet (81 mg total) by mouth daily. Patient taking differently: Chew 81 mg by mouth in the morning. 05/05/19  Yes Bensimhon, Shaune Pascal, MD  atorvastatin (LIPITOR) 80 MG tablet TAKE 1 TABLET BY MOUTH ONCE DAILY AT 6PM. Patient taking differently:  Take 80 mg by mouth daily at 6 PM. 06/19/20  Yes Hilty, Nadean Corwin, MD  Carboxymethylcellulose Sodium (ARTIFICIAL TEARS OP) Place 1 drop into both eyes daily as needed (dry/irritated eyes.).   Yes [provider]  carvedilol (COREG) 6.25 MG tablet TAKE 1 TABLET BY MOUTH TWICE A DAY Patient taking differently: Take 6.25 mg by mouth in the morning and at bedtime. 11/01/20  Yes Bensimhon, Shaune Pascal, MD  citalopram (CELEXA) 10 MG tablet Take 10 mg by mouth 2 (two) times a week.   Yes [provider]  clopidogrel (PLAVIX) 75 MG tablet STOP EFFIENT. WAIT 24 HOURS. TAKE 300 MG OF PLAVIX ONCE. THEN TAKE 75 MG BY MOUTH DAILY. Patient taking differently: Take 75 mg by mouth in the morning. STOP EFFIENT. WAIT 24 HOURS. TAKE 300 MG OF PLAVIX ONCE. THEN TAKE 75 MG BY MOUTH DAILY. 07/31/20  Yes Shirley Friar, PA-C  dapagliflozin propanediol (FARXIGA) 10 MG TABS tablet Take 1 tablet (10 mg total) by mouth daily. Patient taking differently: Take 10 mg by mouth in the morning. 09/19/20  Yes Bensimhon, Shaune Pascal, MD  diphenhydrAMINE (BENADRYL) 25 MG tablet Take 50 mg by mouth at  bedtime.   Yes [provider]  ENTRESTO 24-26 MG TAKE 1 TABLET BY MOUTH 2 (TWO) TIMES DAILY. Patient taking differently: Take 1 tablet by mouth 2 (two) times daily. 10/28/19  Yes Bensimhon, Shaune Pascal, MD  eplerenone (INSPRA) 50 MG tablet Take 50 mg by mouth in the morning.   Yes [provider]  ezetimibe (ZETIA) 10 MG tablet TAKE ONE TABLET BY MOUTH DAILY Patient taking differently: Take 10 mg by mouth in the morning. 05/30/20  Yes Lelon Perla, MD  furosemide (LASIX) 40 MG tablet Take 1 tablet (40 mg total) by mouth daily. Patient taking differently: Take 40 mg by mouth in the morning. 11/15/20  Yes Sherren Mocha, MD  inFLIXimab in sodium chloride 0.9 % Inject into the vein every 6 (six) weeks.   Yes [provider]  tamsulosin (FLOMAX) 0.4 MG CAPS capsule TAKE 1 CAPSULE(0.4 MG) BY MOUTH DAILY Patient taking differently: Take 0.4 mg by mouth in the morning. 10/03/20  Yes [name], MD  umeclidinium bromide (INCRUSE ELLIPTA) 62.5 MCG/INH AEPB Inhale 1 puff into the lungs daily. Patient taking differently: Inhale 1 puff into the lungs daily as needed (respiratory issues.). 08/10/20  Yes [name], MD  ivabradine (CORLANOR) 5 MG TABS tablet Take 5 mg 1.5 hours prior to your CT scan appointment time. Bring the second tablet with you, but do not take unless directed. Patient not taking: Reported on 11/24/2020 11/15/20   Sherren Mocha, MD     Critical care time: 65 minutes    CRITICAL CARE Performed by: Otilio Carpen Tonnie Stillman   Total critical care time: 65 minutes  Critical care time was exclusive of separately billable procedures and treating other patients.  Critical care was necessary to treat or prevent imminent or life-threatening deterioration.  Critical care was time spent personally by me on the following activities: development of treatment plan with patient and/or surrogate as well as nursing, discussions with consultants, evaluation of patient's  response to treatment, examination of patient, obtaining history from patient or surrogate, ordering and performing treatments and interventions, ordering and review of laboratory studies, ordering and review of radiographic studies, pulse oximetry and re-evaluation of patient's condition.   Otilio Carpen Heiress Williamson, PA-C [facility] See Amion for pager If no response to pager ,  please call [phone] until 7pm After 7:00 pm call Elink  [phone]

## 2020-11-29 NOTE — Progress Notes (Signed)
Went into the patient's room to remove air from pt's TR band.  Pt requesting to go the bathroom and ambulated to the restroom without incident.  Upon return to the room, pt was shaking and states that he has a daily episode of shaking for the past few weeks.  The last amount of air removed from patient's TR band and blankets given to patient to help with his shivering.  Pulse Ox reading was not reading correctly due to patient's hand being cold and shivering.  Oral temp was taken and found to be 97.7 F.

## 2020-11-29 NOTE — Progress Notes (Signed)
Pharmacy Antibiotic Note  Brandon [name] is a 65 y.o. male admitted on 11/29/2020 with sepsis.  He has been having shaking chills for a few days, has hardware - ICD and barostem and on inflixamab as outpt. Tm102, WBC elevated 11 Cr 1  Pharmacy has been consulted for vancomycin and ceftriaxone dosing.  Plan: Vancomycin 1500mg  x1 then 1gm q12h eAUC 480 And rocephin 2gm q24h  Height: 6' (182.9 cm) Weight: 83 kg (183 lb) IBW/kg (Calculated) : 77.6  Temp (24hrs), Avg:97.7 F (36.5 C), Min:97.7 F (36.5 C), Max:97.7 F (36.5 C)  Recent Labs  Lab 11/29/20 1310  WBC 11.1*    CrCl cannot be calculated (Patient's most recent lab result is older than the maximum 21 days allowed.).    No Known Allergies  Antimicrobials this admission: Vanc 7/20> Rocehin 7/20> Dose adjustments this admission:   Microbiology results: 7/20 [place].D. CPP, BCPS Clinical Pharmacist [phone] 11/29/2020 3:15 PM

## 2020-11-29 NOTE — Interval H&P Note (Signed)
History and Physical Interval Note:  11/29/2020 9:08 AM  Brandon [name]  has presented today for surgery, with the diagnosis of aortic stenosis.  The various methods of treatment have been discussed with the patient and family. After consideration of risks, benefits and other options for treatment, the patient has consented to  Procedure(s): RIGHT/LEFT HEART CATH AND CORONARY ANGIOGRAPHY (N/A) as a surgical intervention.  The patient's history has been reviewed, patient examined, no change in status, stable for surgery.  I have reviewed the patient's chart and labs.  Questions were answered to the patient's satisfaction.     [name]

## 2020-11-29 NOTE — Progress Notes (Signed)
Patient's systolic BP in the 69S.  Pt denies any symptoms and reports that most recently that is how his BP has been trending. Will continue to monitor. Patient's sister at bedside.

## 2020-11-30 ENCOUNTER — Inpatient Hospital Stay (HOSPITAL_COMMUNITY): Payer: Medicare Other

## 2020-11-30 DIAGNOSIS — R570 Cardiogenic shock: Secondary | ICD-10-CM | POA: Diagnosis not present

## 2020-11-30 DIAGNOSIS — T827XXA Infection and inflammatory reaction due to other cardiac and vascular devices, implants and grafts, initial encounter: Secondary | ICD-10-CM

## 2020-11-30 DIAGNOSIS — Z9911 Dependence on respirator [ventilator] status: Secondary | ICD-10-CM

## 2020-11-30 DIAGNOSIS — J9690 Respiratory failure, unspecified, unspecified whether with hypoxia or hypercapnia: Secondary | ICD-10-CM

## 2020-11-30 DIAGNOSIS — R7989 Other specified abnormal findings of blood chemistry: Secondary | ICD-10-CM | POA: Clinically undetermined

## 2020-11-30 DIAGNOSIS — J9602 Acute respiratory failure with hypercapnia: Secondary | ICD-10-CM

## 2020-11-30 DIAGNOSIS — E876 Hypokalemia: Secondary | ICD-10-CM | POA: Diagnosis present

## 2020-11-30 DIAGNOSIS — R6521 Severe sepsis with septic shock: Secondary | ICD-10-CM

## 2020-11-30 DIAGNOSIS — J9601 Acute respiratory failure with hypoxia: Secondary | ICD-10-CM | POA: Diagnosis not present

## 2020-11-30 DIAGNOSIS — Z9289 Personal history of other medical treatment: Secondary | ICD-10-CM

## 2020-11-30 DIAGNOSIS — A419 Sepsis, unspecified organism: Secondary | ICD-10-CM | POA: Diagnosis not present

## 2020-11-30 DIAGNOSIS — J9600 Acute respiratory failure, unspecified whether with hypoxia or hypercapnia: Secondary | ICD-10-CM

## 2020-11-30 DIAGNOSIS — I350 Nonrheumatic aortic (valve) stenosis: Secondary | ICD-10-CM | POA: Diagnosis not present

## 2020-11-30 DIAGNOSIS — R7881 Bacteremia: Secondary | ICD-10-CM

## 2020-11-30 DIAGNOSIS — I255 Ischemic cardiomyopathy: Secondary | ICD-10-CM

## 2020-11-30 DIAGNOSIS — R778 Other specified abnormalities of plasma proteins: Secondary | ICD-10-CM | POA: Clinically undetermined

## 2020-11-30 DIAGNOSIS — B955 Unspecified streptococcus as the cause of diseases classified elsewhere: Secondary | ICD-10-CM

## 2020-11-30 DIAGNOSIS — T80219A Unspecified infection due to central venous catheter, initial encounter: Secondary | ICD-10-CM

## 2020-11-30 DIAGNOSIS — D649 Anemia, unspecified: Secondary | ICD-10-CM | POA: Diagnosis present

## 2020-11-30 DIAGNOSIS — I340 Nonrheumatic mitral (valve) insufficiency: Secondary | ICD-10-CM | POA: Diagnosis not present

## 2020-11-30 DIAGNOSIS — R0600 Dyspnea, unspecified: Secondary | ICD-10-CM

## 2020-11-30 LAB — POCT I-STAT EG7
Acid-Base Excess: 2 mmol/L (ref 0.0–2.0)
Acid-base deficit: 2 mmol/L (ref 0.0–2.0)
Bicarbonate: 22.4 mmol/L (ref 20.0–28.0)
Bicarbonate: 26.8 mmol/L (ref 20.0–28.0)
Calcium, Ion: 0.85 mmol/L — CL (ref 1.15–1.40)
Calcium, Ion: 1.19 mmol/L (ref 1.15–1.40)
HCT: 26 % — ABNORMAL LOW (ref 39.0–52.0)
HCT: 31 % — ABNORMAL LOW (ref 39.0–52.0)
Hemoglobin: 8.8 g/dL — ABNORMAL LOW (ref 13.0–17.0)
Hemoglobin: 10.5 g/dL — ABNORMAL LOW (ref 13.0–17.0)
O2 Saturation: 70 %
O2 Saturation: 69 %
Potassium: 2.1 mmol/L — CL (ref 3.5–5.1)
Potassium: 2.7 mmol/L — CL (ref 3.5–5.1)
Sodium: 142 mmol/L (ref 135–145)
Sodium: 135 mmol/L (ref 135–145)
TCO2: 23 mmol/L (ref 22–32)
TCO2: 28 mmol/L (ref 22–32)
pCO2, Ven: 36.1 mmHg — ABNORMAL LOW (ref 44.0–60.0)
pCO2, Ven: 40.5 mmHg — ABNORMAL LOW (ref 44.0–60.0)
pH, Ven: 7.401 (ref 7.250–7.430)
pH, Ven: 7.428 (ref 7.250–7.430)
pO2, Ven: 36 mmHg (ref 32.0–45.0)
pO2, Ven: 35 mmHg (ref 32.0–45.0)

## 2020-11-30 LAB — HEPATIC FUNCTION PANEL
ALT: 70 U/L — ABNORMAL HIGH (ref 0–44)
AST: 63 U/L — ABNORMAL HIGH (ref 15–41)
Albumin: 2.3 g/dL — ABNORMAL LOW (ref 3.5–5.0)
Alkaline Phosphatase: 66 U/L (ref 38–126)
Bilirubin, Direct: 0.3 mg/dL — ABNORMAL HIGH (ref 0.0–0.2)
Indirect Bilirubin: 1 mg/dL — ABNORMAL HIGH (ref 0.3–0.9)
Total Bilirubin: 1.3 mg/dL — ABNORMAL HIGH (ref 0.3–1.2)
Total Protein: 6.3 g/dL — ABNORMAL LOW (ref 6.5–8.1)

## 2020-11-30 LAB — POCT I-STAT 7, (LYTES, BLD GAS, ICA,H+H)
Acid-Base Excess: 2 mmol/L (ref 0.0–2.0)
Acid-base deficit: 5 mmol/L — ABNORMAL HIGH (ref 0.0–2.0)
Acid-base deficit: 7 mmol/L — ABNORMAL HIGH (ref 0.0–2.0)
Bicarbonate: 26.2 mmol/L (ref 20.0–28.0)
Bicarbonate: 19.2 mmol/L — ABNORMAL LOW (ref 20.0–28.0)
Bicarbonate: 17.3 mmol/L — ABNORMAL LOW (ref 20.0–28.0)
Calcium, Ion: 1.18 mmol/L (ref 1.15–1.40)
Calcium, Ion: 1.09 mmol/L — ABNORMAL LOW (ref 1.15–1.40)
Calcium, Ion: 1.17 mmol/L (ref 1.15–1.40)
HCT: 31 % — ABNORMAL LOW (ref 39.0–52.0)
HCT: 33 % — ABNORMAL LOW (ref 39.0–52.0)
HCT: 33 % — ABNORMAL LOW (ref 39.0–52.0)
Hemoglobin: 10.5 g/dL — ABNORMAL LOW (ref 13.0–17.0)
Hemoglobin: 11.2 g/dL — ABNORMAL LOW (ref 13.0–17.0)
Hemoglobin: 11.2 g/dL — ABNORMAL LOW (ref 13.0–17.0)
O2 Saturation: 99 %
O2 Saturation: 100 %
O2 Saturation: 99 %
Potassium: 2.7 mmol/L — CL (ref 3.5–5.1)
Potassium: 4.2 mmol/L (ref 3.5–5.1)
Potassium: 4.1 mmol/L (ref 3.5–5.1)
Sodium: 135 mmol/L (ref 135–145)
Sodium: 133 mmol/L — ABNORMAL LOW (ref 135–145)
Sodium: 133 mmol/L — ABNORMAL LOW (ref 135–145)
TCO2: 27 mmol/L (ref 22–32)
TCO2: 20 mmol/L — ABNORMAL LOW (ref 22–32)
TCO2: 18 mmol/L — ABNORMAL LOW (ref 22–32)
pCO2 arterial: 38 mmHg (ref 32.0–48.0)
pCO2 arterial: 33.7 mmHg (ref 32.0–48.0)
pCO2 arterial: 29 mmHg — ABNORMAL LOW (ref 32.0–48.0)
pH, Arterial: 7.446 (ref 7.350–7.450)
pH, Arterial: 7.365 (ref 7.350–7.450)
pH, Arterial: 7.383 (ref 7.350–7.450)
pO2, Arterial: 115 mmHg — ABNORMAL HIGH (ref 83.0–108.0)
pO2, Arterial: 224 mmHg — ABNORMAL HIGH (ref 83.0–108.0)
pO2, Arterial: 145 mmHg — ABNORMAL HIGH (ref 83.0–108.0)

## 2020-11-30 LAB — CBC
HCT: 33 % — ABNORMAL LOW (ref 39.0–52.0)
Hemoglobin: 11.1 g/dL — ABNORMAL LOW (ref 13.0–17.0)
MCH: 28.8 pg (ref 26.0–34.0)
MCHC: 33.6 g/dL (ref 30.0–36.0)
MCV: 85.7 fL (ref 80.0–100.0)
Platelets: 297 10*3/uL (ref 150–400)
RBC: 3.85 MIL/uL — ABNORMAL LOW (ref 4.22–5.81)
RDW: 14.4 % (ref 11.5–15.5)
WBC: 23.3 10*3/uL — ABNORMAL HIGH (ref 4.0–10.5)
nRBC: 0 % (ref 0.0–0.2)

## 2020-11-30 LAB — BASIC METABOLIC PANEL
Anion gap: 9 (ref 5–15)
Anion gap: 10 (ref 5–15)
Anion gap: 8 (ref 5–15)
BUN: 15 mg/dL (ref 8–23)
BUN: 17 mg/dL (ref 8–23)
BUN: 19 mg/dL (ref 8–23)
CO2: 21 mmol/L — ABNORMAL LOW (ref 22–32)
CO2: 15 mmol/L — ABNORMAL LOW (ref 22–32)
CO2: 18 mmol/L — ABNORMAL LOW (ref 22–32)
Calcium: 7.8 mg/dL — ABNORMAL LOW (ref 8.9–10.3)
Calcium: 7.9 mg/dL — ABNORMAL LOW (ref 8.9–10.3)
Calcium: 7.9 mg/dL — ABNORMAL LOW (ref 8.9–10.3)
Chloride: 100 mmol/L (ref 98–111)
Chloride: 106 mmol/L (ref 98–111)
Chloride: 105 mmol/L (ref 98–111)
Creatinine, Ser: 1.2 mg/dL (ref 0.61–1.24)
Creatinine, Ser: 1.13 mg/dL (ref 0.61–1.24)
Creatinine, Ser: 1.04 mg/dL (ref 0.61–1.24)
GFR, Estimated: >60 mL/min (ref >60)
GFR, Estimated: >60 mL/min (ref >60)
GFR, Estimated: >60 mL/min (ref >60)
Glucose, Bld: 196 mg/dL — ABNORMAL HIGH (ref 70–99)
Glucose, Bld: 191 mg/dL — ABNORMAL HIGH (ref 70–99)
Glucose, Bld: 143 mg/dL — ABNORMAL HIGH (ref 70–99)
Potassium: 4 mmol/L (ref 3.5–5.1)
Potassium: 4.4 mmol/L (ref 3.5–5.1)
Potassium: 3.8 mmol/L (ref 3.5–5.1)
Sodium: 130 mmol/L — ABNORMAL LOW (ref 135–145)
Sodium: 131 mmol/L — ABNORMAL LOW (ref 135–145)
Sodium: 131 mmol/L — ABNORMAL LOW (ref 135–145)

## 2020-11-30 LAB — CBC WITH DIFFERENTIAL/PLATELET
Abs Immature Granulocytes: 0.25 10*3/uL — ABNORMAL HIGH (ref 0.00–0.07)
Basophils Absolute: 0 10*3/uL (ref 0.0–0.1)
Basophils Relative: 0 %
Eosinophils Absolute: 0 10*3/uL (ref 0.0–0.5)
Eosinophils Relative: 0 %
HCT: 35.2 % — ABNORMAL LOW (ref 39.0–52.0)
Hemoglobin: 11.7 g/dL — ABNORMAL LOW (ref 13.0–17.0)
Immature Granulocytes: 1 %
Lymphocytes Relative: 3 %
Lymphs Abs: 0.8 10*3/uL (ref 0.7–4.0)
MCH: 29 pg (ref 26.0–34.0)
MCHC: 33.2 g/dL (ref 30.0–36.0)
MCV: 87.3 fL (ref 80.0–100.0)
Monocytes Absolute: 1.4 10*3/uL — ABNORMAL HIGH (ref 0.1–1.0)
Monocytes Relative: 5 %
Neutro Abs: 23.7 10*3/uL — ABNORMAL HIGH (ref 1.7–7.7)
Neutrophils Relative %: 91 %
Platelets: 322 10*3/uL (ref 150–400)
RBC: 4.03 MIL/uL — ABNORMAL LOW (ref 4.22–5.81)
RDW: 14.2 % (ref 11.5–15.5)
WBC: 26.1 10*3/uL — ABNORMAL HIGH (ref 4.0–10.5)
nRBC: 0 % (ref 0.0–0.2)

## 2020-11-30 LAB — BLOOD CULTURE ID PANEL (REFLEXED) - BCID2

## 2020-11-30 LAB — GLUCOSE, CAPILLARY
Glucose-Capillary: 173 mg/dL — ABNORMAL HIGH (ref 70–99)
Glucose-Capillary: 163 mg/dL — ABNORMAL HIGH (ref 70–99)
Glucose-Capillary: 121 mg/dL — ABNORMAL HIGH (ref 70–99)
Glucose-Capillary: 109 mg/dL — ABNORMAL HIGH (ref 70–99)
Glucose-Capillary: 116 mg/dL — ABNORMAL HIGH (ref 70–99)
Glucose-Capillary: 123 mg/dL — ABNORMAL HIGH (ref 70–99)

## 2020-11-30 LAB — URINALYSIS, ROUTINE W REFLEX MICROSCOPIC
Bilirubin Urine: NEGATIVE
Glucose, UA: >=500 mg/dL — AB
Ketones, ur: NEGATIVE mg/dL
Leukocytes,Ua: NEGATIVE
Nitrite: NEGATIVE
Protein, ur: 100 mg/dL — AB
Specific Gravity, Urine: 1.017 (ref 1.005–1.030)
pH: 5 (ref 5.0–8.0)

## 2020-11-30 LAB — COOXEMETRY PANEL
Carboxyhemoglobin: 0.7 % (ref 0.5–1.5)
Methemoglobin: 1.2 % (ref 0.0–1.5)
O2 Saturation: 70.1 %
Total hemoglobin: 11.5 g/dL — ABNORMAL LOW (ref 12.0–16.0)

## 2020-11-30 LAB — LIPASE, BLOOD: Lipase: 47 U/L (ref 11–51)

## 2020-11-30 LAB — TRIGLYCERIDES: Triglycerides: 99 mg/dL (ref <150)

## 2020-11-30 LAB — MAGNESIUM
Magnesium: 1.6 mg/dL — ABNORMAL LOW (ref 1.7–2.4)
Magnesium: 2.1 mg/dL (ref 1.7–2.4)

## 2020-11-30 LAB — HEMOGLOBIN A1C
Hgb A1c MFr Bld: 6.4 % — ABNORMAL HIGH (ref 4.8–5.6)
Mean Plasma Glucose: 136.98 mg/dL

## 2020-11-30 LAB — HIV ANTIBODY (ROUTINE TESTING W REFLEX): HIV Screen 4th Generation wRfx: NONREACTIVE

## 2020-11-30 LAB — LACTIC ACID, PLASMA: Lactic Acid, Venous: 1.7 mmol/L (ref 0.5–1.9)

## 2020-11-30 LAB — PROCALCITONIN: Procalcitonin: 1.05 ng/mL

## 2020-11-30 LAB — BRAIN NATRIURETIC PEPTIDE: B Natriuretic Peptide: 2653.1 pg/mL — ABNORMAL HIGH (ref 0.0–100.0)

## 2020-11-30 LAB — TROPONIN I (HIGH SENSITIVITY)
Troponin I (High Sensitivity): 3550 ng/L (ref <18)
Troponin I (High Sensitivity): 4843 ng/L (ref <18)

## 2020-11-30 LAB — SEDIMENTATION RATE: Sed Rate: 30 mm/hr — ABNORMAL HIGH (ref 0–16)

## 2020-11-30 MED ORDER — CALCIUM GLUCONATE-NACL 1-0.675 GM/50ML-% IV SOLN
1.0000 g | Freq: Once | INTRAVENOUS | Status: AC
  Administered 2020-11-30: 1000 mg via INTRAVENOUS
  Filled 2020-11-30: qty 50

## 2020-11-30 MED ORDER — PIPERACILLIN-TAZOBACTAM 3.375 G IVPB
3.3750 g | Freq: Three times a day (TID) | INTRAVENOUS | Status: DC
  Administered 2020-11-30: 3.375 g via INTRAVENOUS
  Filled 2020-11-30: qty 50

## 2020-11-30 MED ORDER — POTASSIUM CHLORIDE 20 MEQ PO PACK
40.0000 meq | PACK | Freq: Once | ORAL | Status: AC
  Administered 2020-11-30: 40 meq
  Filled 2020-11-30: qty 2

## 2020-11-30 MED ORDER — INSULIN ASPART 100 UNIT/ML IJ SOLN
0.0000 [IU] | INTRAMUSCULAR | Status: DC
Start: 2020-11-30 — End: 2020-12-04
  Administered 2020-11-30: 2 [IU] via SUBCUTANEOUS
  Administered 2020-11-30: 3 [IU] via SUBCUTANEOUS
  Administered 2020-12-01 – 2020-12-02 (×7): 2 [IU] via SUBCUTANEOUS
  Administered 2020-12-02: 3 [IU] via SUBCUTANEOUS
  Administered 2020-12-02 – 2020-12-03 (×4): 2 [IU] via SUBCUTANEOUS

## 2020-11-30 MED ORDER — FENTANYL 2500MCG IN NS 250ML (10MCG/ML) PREMIX INFUSION
0.0000 ug/h | INTRAVENOUS | Status: DC
  Administered 2020-11-30: 100 ug/h via INTRAVENOUS
  Administered 2020-12-01: 125 ug/h via INTRAVENOUS
  Filled 2020-11-30 (×2): qty 250

## 2020-11-30 MED ORDER — VITAL HIGH PROTEIN PO LIQD: 1000.0000 mL | ORAL | Status: DC

## 2020-11-30 MED ORDER — EPINEPHRINE HCL 5 MG/250ML IV SOLN IN NS: 0.5000 ug/min | INTRAVENOUS | Status: DC

## 2020-11-30 MED ORDER — PIPERACILLIN-TAZOBACTAM 3.375 G IVPB 30 MIN: 3.3750 g | Freq: Four times a day (QID) | INTRAVENOUS | Status: DC

## 2020-11-30 MED ORDER — ATORVASTATIN CALCIUM 80 MG PO TABS
80.0000 mg | ORAL_TABLET | Freq: Every day | ORAL | Status: DC
  Administered 2020-11-30: 80 mg via ORAL

## 2020-11-30 MED ORDER — VITAL 1.5 CAL PO LIQD
1000.0000 mL | ORAL | Status: DC
  Administered 2020-11-30 – 2020-12-02 (×2): 1000 mL

## 2020-11-30 MED ORDER — NOREPINEPHRINE 16 MG/250ML-% IV SOLN
0.0000 ug/min | INTRAVENOUS | Status: DC
  Administered 2020-11-30: 25 ug/min via INTRAVENOUS
  Administered 2020-11-30: 40 ug/min via INTRAVENOUS
  Administered 2020-12-01: 16 ug/min via INTRAVENOUS
  Administered 2020-12-01: 20 ug/min via INTRAVENOUS
  Filled 2020-11-30 (×5): qty 250

## 2020-11-30 MED ORDER — CEFTRIAXONE SODIUM 2 G IJ SOLR
2.0000 g | INTRAMUSCULAR | Status: DC
  Administered 2020-11-30: 2 g via INTRAVENOUS
  Filled 2020-11-30 (×2): qty 20

## 2020-11-30 MED ORDER — EPINEPHRINE HCL 5 MG/250ML IV SOLN IN NS
INTRAVENOUS | Status: AC
  Administered 2020-11-30: 10 ug/min via INTRAVENOUS
  Filled 2020-11-30: qty 250

## 2020-11-30 MED ORDER — MAGNESIUM SULFATE 2 GM/50ML IV SOLN
2.0000 g | Freq: Once | INTRAVENOUS | Status: AC
  Administered 2020-11-30: 2 g via INTRAVENOUS
  Filled 2020-11-30: qty 50

## 2020-11-30 MED ORDER — PROSOURCE TF PO LIQD
45.0000 mL | Freq: Two times a day (BID) | ORAL | Status: DC
  Administered 2020-11-30 – 2020-12-02 (×5): 45 mL
  Filled 2020-11-30 (×5): qty 45

## 2020-11-30 MED ORDER — NOREPINEPHRINE 16 MG/250ML-% IV SOLN: 0.0000 ug/min | INTRAVENOUS | Status: DC

## 2020-11-30 NOTE — Progress Notes (Signed)
Initial Nutrition Assessment  DOCUMENTATION CODES:   Non-severe (moderate) malnutrition in context of chronic illness  INTERVENTION:   Tube feeding via OG tube: - Start Vital 1.5 @ 20 ml/hr and advance by 10 ml q 6 hours to goal rate of 60 ml/hr (1440 ml/day) - ProSource TF 45 ml BID  Tube feeding regimen at goal rate provides 2240 kcal, 119 grams of protein, and 1100 ml of H2O.   Tube feeding regimen at goal rate and current propofol provides 2700 total kcal (>100% of needs).  Monitor magnesium, potassium, and phosphorus BID for at least 3 days, MD to replete as needed, as pt is at risk for refeeding syndrome given malnutrition.  NUTRITION DIAGNOSIS:   Moderate Malnutrition related to chronic illness (CHF) as evidenced by moderate fat depletion, moderate muscle depletion.  GOAL:   Patient will meet greater than or equal to 90% of their needs  MONITOR:   Vent status, Labs, Weight trends, TF tolerance, I & O's  REASON FOR ASSESSMENT:   Ventilator, Consult Enteral/tube feeding initiation and management  ASSESSMENT:   65 year old male who presented on 7/20 for scheduled RHC/LHC as part of VAD work-up. PMH of CAD s/p previous MI, HTN, HLD, [employer]. Jude ICD, CHF, s/p Barostim. Pt admitted after cath with fever, tachycardia, N/V, and acute hypoxemic respiratory failure.  7/20 - intubated 7/21 - TEE  Per notes, pt with septic shock secondary to strep bacteremia and suspected aortic valve endocarditis. Plan is for TEE at bedside today. Pt will need ICD removed.  Received consult for tube feeding initiation and management. Pt with OG tube in the gastric antrum per abdominal x-ray today, currently to low intermittent suction.  Discussed pt with RN. No family present at time of RD visit. Per chart review, pt with several weeks of N/V PTA. Unsure of etiology or frequency of N/V.  Reviewed weight history in chart. Will use 85 kg as EDW at this time. Noted pt's weight was declining PTA  (88.5 kg on 6/22 to 85.2 kg on 7/06). Current weight of 94.6 kg is likely falsely elevated related to edema.  Patient is currently intubated on ventilator support MV: 14.6 L/min Temp (24hrs), Avg:98.8 F (37.1 C), Min:98.5 F (36.9 C), Max:99.6 F (37.6 C) BP (a-line): 110/65 MAP (a-line): 82  Drips: Propofol: 17.43 ml/hr (provides 460 kcal daily from lipid) Fentanyl Levophed Vasopressin  Medications reviewed and include: colace, SSI q 4 hours, IV protonix, miralax, IV abx  Labs reviewed: sodium 133, elevated LFTs, hemoglobin A1C 6.4 CBG's: 163-173 x 24 hours  UOP: 1650 ml x 24 hours  NUTRITION - FOCUSED PHYSICAL EXAM:  Flowsheet Row Most Recent Value  Orbital Region Moderate depletion  Upper Arm Region Mild depletion  Thoracic and Lumbar Region Moderate depletion  Buccal Region Unable to assess  Temple Region Moderate depletion  Clavicle Bone Region Moderate depletion  Clavicle and Acromion Bone Region Moderate depletion  Scapular Bone Region Unable to assess  Dorsal Hand No depletion  Patellar Region Moderate depletion  Anterior Thigh Region Moderate depletion  Posterior Calf Region Moderate depletion  Edema (RD Assessment) Mild  Hair Reviewed  Eyes Unable to assess  Mouth Unable to assess  Skin Reviewed  Nails Reviewed       Diet Order:   Diet Order             Diet NPO time specified  Diet effective now  EDUCATION NEEDS:   Not appropriate for education at this time  Skin:  Skin Assessment: Reviewed RN Assessment  Last BM:  11/28/20  Height:   Ht Readings from Last 1 Encounters:  11/29/20 6' (1.829 m)    Weight:   Wt Readings from Last 1 Encounters:  11/30/20 94.6 kg    BMI:  Body mass index is 28.29 kg/m.  Estimated Nutritional Needs:   Kcal:  2100-2300  Protein:  115-135 grams  Fluid:  >/= 2.0 L    Brandon Bryant, MS, RD, LDN Inpatient Clinical Dietitian Please see AMiON for contact information.

## 2020-11-30 NOTE — CV Procedure (Signed)
    TRANSESOPHAGEAL ECHOCARDIOGRAM   NAME:  Brandon [name]   MRN: [id number] DOB:  1955/09/18   ADMIT DATE: 11/29/2020  INDICATIONS:  Bacteremia  PROCEDURE:   Emergent consent. Patient already sedated on vent. Timeout performed.   The transesophageal probe was inserted in the esophagus and stomach without difficulty and multiple views were obtained.    COMPLICATIONS:    There were no immediate complications.  FINDINGS:  LEFT VENTRICLE: EF = 20%. Sever global HK  RIGHT VENTRICLE: Mild HK  LEFT ATRIUM: Severely dilated  LEFT ATRIAL APPENDAGE: No thrombus.   RIGHT ATRIUM: Mildly dilated  AORTIC VALVE:  Trileaflet. Severely calcified. 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 No vegetation. Likely low-flow low gradient AS. Mean gradient 18. VR = 0.19 VTI = 7.5  MITRAL VALVE:    Normal. Mild MR. No vegetation   TRICUSPID VALVE: Normal. Mild TR. No vegetation. ICD wire. No vegetation  PULMONIC VALVE: Not well visualized. Grossly normal.  INTERATRIAL SEPTUM: No PFO or ASD.  PERICARDIUM: No effusion  DESCENDING AORTA: Severe plaque    Cai Anfinson,MD 10:58 AM

## 2020-11-30 NOTE — Procedures (Signed)
Central Venous Catheter Insertion Procedure Note  Brandon [name]  [id number]  [birth year]  Date:11/30/20  Time:2:35 AM   Provider Performing:Lizzy Hamre R Yechezkel Fertig   Procedure: Insertion of Non-tunneled Central Venous Catheter(36556) with US guidance (82423)   Indication(s) Medication administration  Consent Unable to obtain consent due to emergent nature of procedure.  Anesthesia Topical only with 1% lidocaine   Timeout Verified patient identification, verified procedure, site/side was marked, verified correct patient position, special equipment/implants available, medications/allergies/relevant history reviewed, required imaging and test results available.  Sterile Technique Maximal sterile technique including full sterile barrier drape, hand hygiene, sterile gown, sterile gloves, mask, hair covering, sterile ultrasound probe cover (if used).  Procedure Description Area of catheter insertion was cleaned with chlorhexidine and draped in sterile fashion.  With real-time ultrasound guidance a central venous catheter was placed into the right internal jugular vein. Nonpulsatile blood flow and easy flushing noted in all ports.  The catheter was sutured in place and sterile dressing applied.  Complications/Tolerance None; patient tolerated the procedure well. Chest X-ray is ordered to verify placement for internal jugular or subclavian cannulation.   Chest x-ray is not ordered for femoral cannulation.  EBL Minimal  Specimen(s) None  Brandon Carpen Kahlani Graber, PA-C

## 2020-11-30 NOTE — Procedures (Signed)
Arterial Catheter Insertion Procedure Note  Brandon [name]  [id number]  1956/02/23  Date:11/30/20  Time:2:37 AM    Provider Performing: Otilio Carpen Vibhav Waddill    Procedure: Insertion of Arterial Line [id number]) with US guidance (81840)   Indication(s) Blood pressure monitoring and/or need for frequent ABGs  Consent Unable to obtain consent due to emergent nature of procedure.  Anesthesia None   Time Out Verified patient identification, verified procedure, site/side was marked, verified correct patient position, special equipment/implants available, medications/allergies/relevant history reviewed, required imaging and test results available.   Sterile Technique Maximal sterile technique including full sterile barrier drape, hand hygiene, sterile gown, sterile gloves, mask, hair covering, sterile ultrasound probe cover (if used).   Procedure Description Area of catheter insertion was cleaned with chlorhexidine and draped in sterile fashion. With real-time ultrasound guidance an arterial catheter was placed into the left femoral artery.  Appropriate arterial tracings confirmed on monitor.     Complications/Tolerance None; patient tolerated the procedure well.   EBL Minimal   Specimen(s) None  Otilio Carpen Valli Randol, PA-C

## 2020-11-30 NOTE — Progress Notes (Addendum)
  Echocardiogram Bedside TEE has been performed.  Merrie Roof F 11/30/2020, 10:46 AM

## 2020-11-30 NOTE — Progress Notes (Deleted)
NAME:  Brandon [name], MRN:  [id number], DOB:  1955/09/15, LOS: 1 ADMISSION DATE:  11/29/2020, CONSULTATION DATE:  11/30/20 REFERRING MD:  cardiology, CHIEF COMPLAINT:  respiratory failure   History of Present Illness:  65 y.o. M with PMH of HFrEF, CAD s/p previous MI with [facility] Jude ICD and barrel stem, hypertension, hyperlipidemia also under TAVR consideration who presented for scheduled right and left heart cath on 7/20, which showed stable two-vessel CAD , EF 20% and heavily calcified aortic valve.   After his procedure, he developed chills, nausea, vomiting and shortness of breath.  He reported nausea and vomiting for the last several weeks.  His temperature climbed to 102F and oxygen saturations dropped into the 70%'s along with sinus tachycardia.  He was admitted and placed on nonrebreather along with vancomycin and ceftriaxone, overnight he developed progressive respiratory distress and tachycardia, PCCM consulted and patient ultimately required intubation and vasopressor support.   Pertinent  Medical History   has a past medical history of AICD (automatic cardioverter/defibrillator) present (08/31/2019), Ankylosing spondylitis ([place]), Arthritis, BENIGN PROSTATIC HYPERTROPHY (06/07/2008), CHF (congestive heart failure) ([place]), COLONIC POLYPS, HX OF (06/07/2008), Depression, H/O hiatal hernia, Heart failure ([place]), HYPERLIPIDEMIA (06/07/2008), HYPERTENSION (06/07/2008), Myocardial infarction ([place]) (2005), NEPHROLITHIASIS, HX OF (06/07/2008), and STEMI (ST elevation myocardial infarction) ([place]) (04/27/2019).  Significant Hospital Events: Including procedures, antibiotic start and stop dates in addition to other pertinent events   7/20 underwent right and left heart cath, postop decompensation and admission.  Vancomycin and ceftriaxone initiated 7/21 respiratory distress overnight along with shock, PCCM consulted and patient intubated along with placement of central line and arterial line. Bcx positive for  strep bacteremia.  Interim History / Subjective:  Intubated and sedated. Tolerating FiO2 50% PEEP 10. On levophed 24 and vasopressin 0.3.   Blood cultures positive for strep bacteremia, on vancomycin and ceftriaxone.   Lactate downtrending 3.8>>1.7 Troponin 3550>>4843 Co-ox 70  Objective   Blood pressure (!) 88/73, pulse 92, temperature 98.8 F (37.1 C), resp. rate (!) 24, height 6' (1.829 m), weight 94.6 kg, SpO2 100 %.    Vent Mode: PRVC FiO2 (%):  [60 %-70 %] 60 % Set Rate:  [20 bmp-24 bmp] 24 bmp Vt Set:  [620 mL] 620 mL PEEP:  [10 cmH20-12 cmH20] 10 cmH20 Plateau Pressure:  [23 cmH20-28 cmH20] 23 cmH20   Intake/Output Summary (Last 24 hours) at 11/30/2020 0840 Last data filed at 11/30/2020 0600 Gross per 24 hour  Intake 1442.83 ml  Output 1650 ml  Net -207.17 ml    Filed Weights   11/29/20 0628 11/30/20 0600  Weight: 83 kg 94.6 kg   General: Intubated and sedated HEENT: AT, [place] Neuro: Pinpoint pupils CV: distant heart sounds. Delayed capillary refill. +2 pedal pulses PULM: CTAB on anterior exam GI: soft, bsx4 active  Extremities: UE warm, LE cold. No edema Skin: no rashes or lesions GU: Foley in place with dark urine in bag  Resolved Hospital Problem list     Assessment & Plan:   Acute hypoxic respiratory failure Possibly secondary to flash pulmonary edema repeat CXR with widespread pulmonary edema or aspiration in the setting of vomiting Failed BiPAP and required intubation Bedside ultrasound did not appear to have acutely enlarged R heart, reducing suspicion for large PE P: -Maintain full vent support with SAT/SBT as tolerated -titrate Vent setting to maintain SpO2 greater than or equal to 90%. -HOB elevated 30 degrees. -Plateau pressures less than 30 cm H20.  -Follow chest x-ray, ABG prn.   -Bronchial  hygiene and RT/bronchodilator protocol.   Septic Shock Strep Bacteremia Suspected Aortic Valve Endocarditis Elevated troponin  Bcx positive for  strep bacteremia. 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 Troponin 3550>>4843 likely demand ischemia in the setting of septic shock. EKG with no STE. Lactate peaked at 3.8 and downtrending. P:  -Con't vanc and ceftriaxone -Con't levophed and vasopressin to maintain MAP>65 -ID consult, appreciate their recommendations -Consider ICD and BaroStim device removal -Follow-up TEE  History of Chronic Systolic HF, ICM, AS CAD S/P [facility] Jude ICD and Barostim Under consideration for TAVR and advanced therapies P: -Management per cardiology and advanced heart failure -Eplerenone, Entresto, farxiga and carvedilol held -continue Asa -Co-ox 70%, continue to monitor -Follow-up HbA1c  Nausea and vomiting Ongoing for several weeks per notes and family P: -lipase WNL -cholelithiasis noted on xray -check RUQ [place] and repeat LFT's  Hypokalemia, hypomagnesemia, hypocalcemia -repleted 7/20 -Follow-up BMP  Best Practice (right click and "Reselect all SmartList Selections" daily)   Diet/type: NPO DVT prophylaxis: LMWH GI prophylaxis: PPI Lines: Central line Foley:  Yes, and it is still needed Code Status:  full code Last date of multidisciplinary goals of care discussion [sister updated by phone 7/20]  Labs   CBC: Recent Labs  Lab 11/29/20 1310 11/29/20 2221 11/29/20 2240 11/29/20 2340 11/30/20 0144 11/30/20 0237  WBC 11.1*  --  24.1*  --  26.1*  --   NEUTROABS  --   --   --   --  23.7*  --   HGB 11.8* 13.3 12.2* 12.9* 11.7* 11.2*  HCT 36.6* 39.0 38.3* 38.0* 35.2* 33.0*  MCV 87.4  --  89.5  --  87.3  --   PLT 274  --  342  --  322  --      Basic Metabolic Panel: Recent Labs  Lab 11/29/20 1310 11/29/20 2221 11/29/20 2240 11/29/20 2340 11/29/20 2347 11/30/20 0144 11/30/20 0237 11/30/20 0523  NA 133*   < > 130* 133*  --  130* 133* 131*  K 2.9*   < > 2.6* 3.5  --  4.0 4.2 4.4  CL 97*  --  98  --   --  100  --  106  CO2 24  --  21*  --   --  21*  --   15*  GLUCOSE 111*  --  207*  --   --  196*  --  191*  BUN 11  --  11  --   --  15  --  17  CREATININE 0.89  --  1.05  --   --  1.20  --  1.13  CALCIUM 8.7*  --  7.5*  --   --  7.8*  --  7.9*  MG  --   --   --   --  1.6*  --   --   --    < > = values in this interval not displayed.    GFR: Estimated Creatinine Clearance: 75.7 mL/min (by C-G formula based on SCr of 1.13 mg/dL). Recent Labs  Lab 11/29/20 1310 11/29/20 1441 11/29/20 2228 11/29/20 2240 11/30/20 0144 11/30/20 0523  PROCALCITON <0.10  --   --   --   --  1.05  WBC 11.1*  --   --  24.1* 26.1*  --   LATICACIDVEN  --  2.3* 3.8*  --  1.7  --      Liver Function Tests: Recent Labs  Lab 11/29/20 1310  AST 36  ALT 56*  ALKPHOS 71  BILITOT 1.1  PROT 7.4  ALBUMIN 3.0*    Recent Labs  Lab 11/30/20 0522  LIPASE 47   No results for input(s): AMMONIA in the last 168 hours.  ABG    Component Value Date/Time   PHART 7.365 11/30/2020 0237   PCO2ART 33.7 11/30/2020 0237   PO2ART 224 (H) 11/30/2020 0237   HCO3 19.2 (L) 11/30/2020 0237   TCO2 20 (L) 11/30/2020 0237   ACIDBASEDEF 5.0 (H) 11/30/2020 0237   O2SAT 100.0 11/30/2020 0237      Coagulation Profile: No results for input(s): INR, PROTIME in the last 168 hours.  Cardiac Enzymes: No results for input(s): CKTOTAL, CKMB, CKMBINDEX, TROPONINI in the last 168 hours.  HbA1C: Hgb A1c MFr Bld  Date/Time Value Ref Range Status  04/27/2019 08:36 PM 6.0 (H) 4.8 - 5.6 % Final    Comment:    (NOTE) Pre diabetes:          5.7%-6.4% Diabetes:              >6.4% Glycemic control for   <7.0% adults with diabetes   04/27/2019 01:00 PM 5.9 (H) 4.8 - 5.6 % Final    Comment:    (NOTE) Pre diabetes:          5.7%-6.4% Diabetes:              >6.4% Glycemic control for   <7.0% adults with diabetes     CBG: Recent Labs  Lab 11/30/20 0132  GLUCAP 173*    Review of Systems:   Unable to obtain secondary to intubation  Past Medical History:  He,  has a  past medical history of AICD (automatic cardioverter/defibrillator) present (08/31/2019), Ankylosing spondylitis ([place]), Arthritis, BENIGN PROSTATIC HYPERTROPHY (06/07/2008), CHF (congestive heart failure) ([place]), COLONIC POLYPS, HX OF (06/07/2008), Depression, H/O hiatal hernia, Heart failure ([place]), HYPERLIPIDEMIA (06/07/2008), HYPERTENSION (06/07/2008), Myocardial infarction ([place]) (2005), NEPHROLITHIASIS, HX OF (06/07/2008), and STEMI (ST elevation myocardial infarction) ([place]) (04/27/2019).   Surgical History:   Past Surgical History:  Procedure Laterality Date   COLONOSCOPY WITH PROPOFOL N/A 04/24/2020   Procedure: COLONOSCOPY WITH PROPOFOL;  Surgeon: [name], MD;  Location: [facility];  Service: Gastroenterology;  Laterality: N/A;   CORONARY ANGIOPLASTY WITH STENT PLACEMENT  2005   LAD stent, jailed diagonal   CORONARY/GRAFT ACUTE MI REVASCULARIZATION N/A 04/27/2019   Procedure: Coronary/Graft Acute MI Revascularization;  Surgeon: [name], MD;  Location: [facility];  Service: Cardiovascular;  Laterality: N/A;   ICD IMPLANT  08/31/2019   ICD IMPLANT N/A 08/31/2019   Procedure: ICD IMPLANT;  Surgeon: [name], MD;  Location: [facility];  Service: Cardiovascular;  Laterality: N/A;   LAMINECTOMY     lumbar   LEFT HEART CATH AND CORONARY ANGIOGRAPHY N/A 04/27/2019   Procedure: LEFT HEART CATH AND CORONARY ANGIOGRAPHY;  Surgeon: [name], MD;  Location: [facility];  Service: Cardiovascular;  Laterality: N/A;   LUMBAR FUSION  01/2012   POLYPECTOMY  04/24/2020   Procedure: POLYPECTOMY;  Surgeon: [name], MD;  Location: [facility];  Service: Gastroenterology;;   RIGHT HEART CATH N/A 04/27/2019   Procedure: RIGHT HEART CATH;  Surgeon: [name], Jamelle M, MD;  Location: [facility];  Service: Cardiovascular;  Laterality: N/A;   RIGHT/LEFT HEART CATH AND CORONARY ANGIOGRAPHY N/A 11/29/2020   Procedure: RIGHT/LEFT HEART  CATH AND CORONARY ANGIOGRAPHY;  Surgeon: Jolaine Artist, MD;  Location: [facility];  Service: Cardiovascular;  Laterality: N/A;   TONSILLECTOMY     warthin tumor removal Left 2014     Social History:   reports that he quit smoking about 16 months ago. His smoking use included cigarettes. He has a 40.00 pack-year smoking history. He has never used smokeless tobacco. He reports current alcohol use of about 7.0 standard drinks of alcohol per week. He reports current drug use.   Family History:  His family history includes Arthritis in his sister; Depression in his father; Heart failure in his mother. There is no history of Other, Colon cancer, Esophageal cancer, Rectal cancer, Stomach cancer, or Colon polyps.   Allergies No Known Allergies   Home Medications  Prior to Admission medications   Medication Sig Start Date End Date Taking? Authorizing Provider  acetaminophen (TYLENOL) 500 MG tablet Take 1,000 mg by mouth every 6 (six) hours as needed (pain.).   Yes [provider]  aspirin 81 MG chewable tablet Chew 1 tablet (81 mg total) by mouth daily. Patient taking differently: Chew 81 mg by mouth in the morning. 05/05/19  Yes Bensimhon, Shaune Pascal, MD  atorvastatin (LIPITOR) 80 MG tablet TAKE 1 TABLET BY MOUTH ONCE DAILY AT 6PM. Patient taking differently: Take 80 mg by mouth daily at 6 PM. 06/19/20  Yes Hilty, Nadean Corwin, MD  Carboxymethylcellulose Sodium (ARTIFICIAL TEARS OP) Place 1 drop into both eyes daily as needed (dry/irritated eyes.).   Yes [provider]  carvedilol (COREG) 6.25 MG tablet TAKE 1 TABLET BY MOUTH TWICE A DAY Patient taking differently: Take 6.25 mg by mouth in the morning and at bedtime. 11/01/20  Yes Bensimhon, Shaune Pascal, MD  citalopram (CELEXA) 10 MG tablet Take 10 mg by mouth 2 (two) times a week.   Yes [provider]  clopidogrel (PLAVIX) 75 MG tablet STOP EFFIENT. WAIT 24 HOURS. TAKE 300 MG OF PLAVIX ONCE. THEN TAKE 75 MG BY MOUTH  DAILY. Patient taking differently: Take 75 mg by mouth in the morning. STOP EFFIENT. WAIT 24 HOURS. TAKE 300 MG OF PLAVIX ONCE. THEN TAKE 75 MG BY MOUTH DAILY. 07/31/20  Yes Shirley Friar, PA-C  dapagliflozin propanediol (FARXIGA) 10 MG TABS tablet Take 1 tablet (10 mg total) by mouth daily. Patient taking differently: Take 10 mg by mouth in the morning. 09/19/20  Yes Bensimhon, Shaune Pascal, MD  diphenhydrAMINE (BENADRYL) 25 MG tablet Take 50 mg by mouth at bedtime.   Yes [provider]  ENTRESTO 24-26 MG TAKE 1 TABLET BY MOUTH 2 (TWO) TIMES DAILY. Patient taking differently: Take 1 tablet by mouth 2 (two) times daily. 10/28/19  Yes Bensimhon, Shaune Pascal, MD  eplerenone (INSPRA) 50 MG tablet Take 50 mg by mouth in the morning.   Yes [provider]  ezetimibe (ZETIA) 10 MG tablet TAKE ONE TABLET BY MOUTH DAILY Patient taking differently: Take 10 mg by mouth in the morning. 05/30/20  Yes Lelon Perla, MD  furosemide (LASIX) 40 MG tablet Take 1 tablet (40 mg total) by mouth daily. Patient taking differently: Take 40 mg by mouth in the morning. 11/15/20  Yes Sherren Mocha, MD  inFLIXimab in sodium chloride 0.9 % Inject into the vein every 6 (six) weeks.   Yes [provider]  tamsulosin (FLOMAX) 0.4 MG CAPS capsule TAKE 1 CAPSULE(0.4 MG) BY MOUTH DAILY Patient taking differently: Take 0.4 mg by mouth in the morning. 10/03/20  Yes [name], MD  umeclidinium bromide (INCRUSE ELLIPTA) 62.5 MCG/INH AEPB Inhale  1 puff into the lungs daily. Patient taking differently: Inhale 1 puff into the lungs daily as needed (respiratory issues.). 08/10/20  Yes [name], MD  ivabradine (CORLANOR) 5 MG TABS tablet Take 5 mg 1.5 hours prior to your CT scan appointment time. Bring the second tablet with you, but do not take unless directed. Patient not taking: Reported on 11/24/2020 11/15/20   Sherren Mocha, MD     Critical care time:     Rennie Natter, MS4

## 2020-11-30 NOTE — Progress Notes (Addendum)
Advanced Heart Failure Rounding Note  PCP-Cardiologist: [name], MD  [facility]: Dr. [name]    Patient Profile   65 year old with a history of h/o CAD s/p previous MI, HTN, HL, ST jude ICD, S/P Barostim, and chronic systolic heart failure, EF 20-25%.   Presented 7/20 for outpatient [facility] as part of VAD w/u. Post procedure, developed severe rigors, n/v and fever to 102 several hours after cardiac catheterization while in short stay. IVFs given for hypotension, labs and blood cx drawn. Admitted to ICU and started on broad spectrum abx. Later developed acute hypoxic respiratory failure, requiring intubation.   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   Prox RCA to Mid RCA lesion is 100% stenosed.   Ost RCA to Prox RCA lesion is 50% stenosed.   Non-stenotic Mid LAD lesion was previously treated.   Findings:   Ao = 81/50 (63) LV = 99/12 RA = 4 RV = 24/6 PA = 19/5 (14) PCW = 3 Fick cardiac output/index = 4.7/2.3 PVR = 2.3 WU Ao sat = 99% PA sat = 69%, 70%   Assessment: Stable 2v CAD Severe iCM EF 20% Low filling pressures with moderately reduced cardiac output Heavily calcified aortic valve (difficult to cross). Appears to be moderate by pressures but may have component of low-gradient, low-flow AS   Subjective:    Intubated and sedated. Afebrile overnight. On Vanc + Zosyn. WBC 26K   On NE 24 + VP 0.03. MAPs 80s.   Lactic acid 2.3>>3.8>>1.7  Co-ox 70%  Currently NSR, HR 90s.    Objective:   Weight Range: 94.6 kg Body mass index is 28.29 kg/m.   Vital Signs:   Temp:  [98.5 F (36.9 C)-100.8 F (38.2 C)] 98.8 F (37.1 C) (07/21 0400) Pulse Rate:  [79-180] 92 (07/21 0727) Resp:  [12-41] 24 (07/21 0727) BP: (59-120)/(44-93) 88/73 (07/21 0727) SpO2:  [10 %-100 %] 100 % (07/21 0739) Arterial Line BP: (88-113)/(67-91) 101/69  (07/21 0600) FiO2 (%):  [60 %-70 %] 60 % (07/21 0739) Weight:  [94.6 kg] 94.6 kg (07/21 0600) Last BM Date: 11/28/20  Weight change: Filed Weights   11/29/20 0628 11/30/20 0600  Weight: 83 kg 94.6 kg    Intake/Output:   Intake/Output Summary (Last 24 hours) at 11/30/2020 0744 Last data filed at 11/30/2020 0600 Gross per 24 hour  Intake 1442.83 ml  Output 1650 ml  Net -207.17 ml      Physical Exam    General:  critically ill, pale, intubated and sedated.  HEENT: + ETT  Neck: Supple. JVP not elevated. Carotids 2+ bilat; no bruits. No lymphadenopathy or thyromegaly appreciated. Cor: PMI nondisplaced. Regular rate & rhythm. No rubs, gallops or murmurs. + Zoll pads  Lungs: intubated and clear Abdomen: Soft, nontender, nondistended. No hepatosplenomegaly. No bruits or masses. Good bowel sounds. Extremities: No cyanosis, clubbing, rash, edema, cool distal extremities  Neuro: intubated and sedated   Telemetry   Currently  NSR HR 90s   EKG    EKG from this morning w/ poor quality tracing/ lots of artifact. Repeat pending.   Labs    CBC Recent Labs    11/29/20 2240 11/29/20 2340 11/30/20 0144 11/30/20 0237  WBC 24.1*  --  26.1*  --   NEUTROABS  --   --  23.7*  --   HGB 12.2*   < >  11.7* 11.2*  HCT 38.3*   < > 35.2* 33.0*  MCV 89.5  --  87.3  --   PLT 342  --  322  --    < > = values in this interval not displayed.   Basic Metabolic Panel Recent Labs    11/29/20 2347 11/30/20 0144 11/30/20 0237 11/30/20 0523  NA  --  130* 133* 131*  K  --  4.0 4.2 4.4  CL  --  100  --  106  CO2  --  21*  --  15*  GLUCOSE  --  196*  --  191*  BUN  --  15  --  17  CREATININE  --  1.20  --  1.13  CALCIUM  --  7.8*  --  7.9*  MG 1.6*  --   --   --    Liver Function Tests Recent Labs    11/29/20 1310  AST 36  ALT 56*  ALKPHOS 71  BILITOT 1.1  PROT 7.4  ALBUMIN 3.0*   Recent Labs    11/30/20 0522  LIPASE 47   Cardiac Enzymes No results for input(s): CKTOTAL,  CKMB, CKMBINDEX, TROPONINI in the last 72 hours.  BNP: BNP (last 3 results) Recent Labs    11/01/20 1224 11/29/20 2240 11/30/20 0144  BNP 462.1* 1,586.1* 2,653.1*    ProBNP (last 3 results) Recent Labs    03/23/20 0922  PROBNP 520*     D-Dimer No results for input(s): DDIMER in the last 72 hours. Hemoglobin A1C No results for input(s): HGBA1C in the last 72 hours. Fasting Lipid Panel Recent Labs    11/30/20 0523  TRIG 99   Thyroid Function Tests No results for input(s): TSH, T4TOTAL, T3FREE, THYROIDAB in the last 72 hours.  Invalid input(s): FREET3  Other results:   Imaging    DG Chest 1 View  Result Date: 11/30/2020 CLINICAL DATA:  Central venous catheter placement, orogastric tube placement EXAM: CHEST  1 VIEW COMPARISON:  11/29/2020 FINDINGS: Endotracheal tube is seen 4.9 cm above the carina. Orogastric tube extends into the upper abdomen beyond the margin of the examination. Right internal jugular central venous catheter tip noted within the superior vena cava. Pulmonary insufflation is stable. Superimposed extensive slightly asymmetric interstitial and airspace infiltrate, more prevalent within the perihilar and lower lung zones bilaterally appears mildly improved, most in keeping with pulmonary edema given its relatively rapid development on 11/29/2020. No pneumothorax or pleural effusion. Right chest wall neurostimulator battery pack is again noted. Left subclavian single lead pacemaker defibrillator is unchanged. Cardiac size within normal limits. IMPRESSION: Stable endotracheal tube.  Orogastric tube in expected position. Right internal jugular central venous catheter tip within the superior vena cava. No pneumothorax. Stable pulmonary insufflation. Slight interval improvement in extensive slightly asymmetric pulmonary infiltrate, likely edema. Electronically Signed   By: Fidela Salisbury MD   On: 11/30/2020 01:14   CARDIAC CATHETERIZATION  Result Date:  11/29/2020 Formatting of this result is different from the original.   Prox Cx lesion is 50% stenosed.   Prox RCA to Mid RCA lesion is 100% stenosed.   Ost RCA to Prox RCA lesion is 50% stenosed.   Non-stenotic Mid LAD lesion was previously treated. Findings: Ao = 81/50 (63) LV = 99/12 RA = 4 RV = 24/6 PA = 19/5 (14) PCW = 3 Fick cardiac output/index = 4.7/2.3 PVR = 2.3 WU Ao sat = 99% PA sat = 69%, 70% Assessment: Stable 2v CAD Severe iCM EF  20% Low filling pressures with moderately reduced cardiac output Heavily calcified aortic valve (difficult to cross). Appears to be moderate by pressures but may have component of low-gradient, low-flow AS Plan/Discussion: Continue VAD w/u. Glori Bickers, MD 10:29 AM  DG CHEST PORT 1 VIEW  Result Date: 11/29/2020 CLINICAL DATA:  History of endotracheal tube. EXAM: PORTABLE CHEST 1 VIEW COMPARISON:  Radiograph earlier today.  Chest CT 04/28/2021 FINDINGS: Endotracheal tube tip is at the level of the clavicular heads 5.5 cm from the carina. Left-sided pacemaker in place. Right vagus nerve stimulator in place. Stable heart size and mediastinal contours. Shifting interstitial opacities, now more prominent in the infrahilar regions. No pneumothorax. Possible small right pleural effusion. IMPRESSION: 1. Endotracheal tube tip at the level of the clavicular heads 5.5 cm from the carina. 2. Similar pulmonary edema from earlier today, with slight shifting of distribution. Possible small right pleural effusion. Electronically Signed   By: Keith Rake M.D.   On: 11/29/2020 23:19   DG CHEST PORT 1 VIEW  Result Date: 11/29/2020 CLINICAL DATA:  Cough, short of breath EXAM: PORTABLE CHEST 1 VIEW COMPARISON:  11/29/2020 FINDINGS: 2 frontal views of the chest demonstrate stable position of the single lead pacer and nerve stimulator. Since the prior exam, diffuse bilateral ground-glass airspace disease has developed most consistent with edema. No effusion or pneumothorax. No acute  bony abnormalities. Cardiac silhouette is stable. IMPRESSION: 1. Worsening volume status, with interval development of widespread pulmonary edema. Electronically Signed   By: Randa Ngo M.D.   On: 11/29/2020 22:30   DG CHEST PORT 1 VIEW  Result Date: 11/29/2020 CLINICAL DATA:  Respiratory distress following cardiac catheterization EXAM: PORTABLE CHEST 1 VIEW COMPARISON:  CT from 11/21/2020 FINDINGS: Cardiac shadow is mildly extension weighted by the portable technique. Pacing device is again seen. Stimulator into the right neck is noted. The lungs are well aerated bilaterally. Mild vascular congestion is seen without interstitial edema. No bony abnormality is noted. IMPRESSION: Changes of mild vascular congestion without interstitial edema. Electronically Signed   By: Inez Catalina M.D.   On: 11/29/2020 13:11   DG Abd Portable 1V  Result Date: 11/30/2020 CLINICAL DATA:  Central venous catheter placement, orogastric tube placement EXAM: PORTABLE ABDOMEN - 1 VIEW COMPARISON:  None. FINDINGS: Orogastric tube tip noted at the expected gastric antrum. Visualized abdominal gas pattern is nonobstructive. Cholelithiasis noted within the right upper quadrant. Osseous changes in keeping with ankylosing spondylitis are noted. L4-5 lumbar fusion with instrumentation has been performed. IMPRESSION: Orogastric tube tip within the gastric antrum. Nonobstructive bowel gas pattern. Cholelithiasis. Ankylosing spondylitis.  In Electronically Signed   By: Fidela Salisbury MD   On: 11/30/2020 01:16     Medications:     Scheduled Medications:  Chlorhexidine Gluconate Cloth  6 each Topical Daily   docusate  100 mg Per Tube BID   enoxaparin (LOVENOX) injection  40 mg Subcutaneous Q24H   insulin aspart  0-15 Units Subcutaneous Q4H   pantoprazole (PROTONIX) IV  40 mg Intravenous Daily   polyethylene glycol  17 g Per Tube Daily   sodium chloride flush  3 mL Intravenous Q12H   sodium chloride flush  3 mL Intravenous Q12H     Infusions:  sodium chloride     sodium chloride     sodium chloride Stopped (11/30/20 0513)   dexmedetomidine (PRECEDEX) IV infusion Stopped (11/30/20 0037)   epinephrine Stopped (11/30/20 0220)   fentaNYL infusion INTRAVENOUS 50 mcg/hr (11/30/20 0600)   norepinephrine (LEVOPHED) Adult  infusion 24 mcg/min (11/30/20 0600)   piperacillin-tazobactam (ZOSYN)  IV 3.375 g (11/30/20 0631)   propofol (DIPRIVAN) infusion 35 mcg/kg/min (11/30/20 0600)   vancomycin 200 mL/hr at 11/30/20 0600   vasopressin 0.03 Units/min (11/30/20 0600)    PRN Medications: sodium chloride, sodium chloride, acetaminophen, fentaNYL (SUBLIMAZE) injection, fentaNYL (SUBLIMAZE) injection, ondansetron (ZOFRAN) IV, sodium chloride flush, sodium chloride flush    Patient Profile   65 year old with a history of h/o CAD s/p previous MI, HTN, HL, ST jude ICD, S/P Barostim, and chronic systolic heart failure, EF 20-25%.  Presented 7/20 for outpatient [facility] as part of VAD w/u. Post procedure, developed severe rigors, n/v and fever to 102 several hours after cardiac catheterization while in short stay. IVFs given for hypotension, labs and blood cx drawn. Admitted to ICU and started on broad spectrum  abx. Later developed acute hypoxic respiratory failure, requiring intubation. Blood Cx + for Strep.   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 Septic Shock  - in setting of bacteremia/ endocarditis - c/w abx, vanc + zosyn - NE + VP for BP support, maintain MAP >65  - Lactic acid down, 3.8>1.7   3. Acute Hypoxic Respiratory Failure - intubated - Vent management per PCCM   4. Chronic Systolic Heart Failure, ICM  - Echo 12/20 EF post anterior MI 30 to 35%. - Echo 6/21 EF 25-30% RV ok  - Echo  05/19/20 EF 20-25% RV mildly HK. moderate AS  Mean gradient 13 AVA 1.2 cm2DI 0.30 - s/p ICD and has Barostim. - Had [facility]HC this admit with low filling pressures. CI marginal at 2.3 - hold eplerenone, entresto, farxiga, and carvedilol in setting of septic shock  - continue NE + VP for BP support - co-ox ok at 70%, follow  - concerned he will need advanced therpaies. Likely not transplant candidate given age and lung disease. Being elevated for LVAD, but currently not a candidate for implant given active infection/ bacteremia.   5. CAD  -s/p anterior STEMI 12/20. LHC showed chronically occluded RCA (with L>>R collaterals) and thrombotic occlusion of proximal LAD. Underwent PCI of LAD. -LHC this admit with stable CAD -Need to continue aspirin. Could keep off plavix.  6. Aortic Stenosis - Recent echo w/ Moderate to severe low gradient AS.  - Saw Dr Burt Knack for possible TAVR . Work-up has been started. - will need to delay possible TAVR until infection clears    7. Elevated HS Trop  - 3,500>>4,843 - cath yesterday w/ Stable 2v CAD - EKG w/ no STE  - suspect demand ischemia in setting of septic shock   Length of Stay: 1  Brittainy Simmons, PA-C  11/30/2020, 7:44 AM  Advanced Heart Failure Team Pager [phone] (M-F; 7a - 5p)  Please contact [facility] for night-coverage after hours (5p -7a ) and weekends on [email or web address]  Agree with above.   Now intubated and sedated. On NE 24 and VP 0.04. Bcx + for strep. Hs trop 4,843   TEE this am with no evidence of endocarditis.   General:  Intubated/sedated HEENT: normal + ETT Neck: supple. no JVD. Carotids 2+ bilat; no bruits. No lymphadenopathy or thryomegaly appreciated. Cor: PMI nondisplaced. Regular rate &  rhythm.2/6 AS Lungs: clear Abdomen: soft, nontender, nondistended. No hepatosplenomegaly. No bruits or masses. Good bowel sounds. Extremities: no cyanosis, clubbing, rash, edema Neuro: alert & orientedx3, cranial nerves grossly intact.  moves all 4 extremities w/o difficulty. Affect pleasant  He has strep endocarditis with septic shock. Continue hemodynamic support and broad spectrum abx. Follow CVP and co-ox.   AS is severe on TEE. Presence of fibroelastoma prevents TAVR. I think best option is to work toward VAD.   Spoke with ID. Plan ICD extraction, 6 weeks IV abx. If surveillance cultures clear can consider VAD implant.   CRITICAL CARE Performed by: Glori Bickers  Total critical care time: 55 minutes  Critical care time was exclusive of separately billable procedures and treating other patients.  Critical care was necessary to treat or prevent imminent or life-threatening deterioration.  Critical care was time spent personally by me (independent of midlevel providers or residents) on the following activities: development of treatment plan with patient and/or surrogate as well as nursing, discussions with consultants, evaluation of patient's response to treatment, examination of patient, obtaining history from patient or surrogate, ordering and performing treatments and interventions, ordering and review of laboratory studies, ordering and review of radiographic studies, pulse oximetry and re-evaluation of patient's condition.  Glori Bickers, MD  11:09 AM

## 2020-11-30 NOTE — Progress Notes (Addendum)
PHARMACY - PHYSICIAN COMMUNICATION CRITICAL VALUE ALERT - BLOOD CULTURE IDENTIFICATION (BCID)  Brandon [name] is an 65 y.o. male who presented to [facility] on 11/29/2020 with a chief complaint of sepsis  Assessment:  Strep species in 2/2 blood cultures (Streptococcus species (may be Streptococcus mutans, bovis, mitis, anginosus, equisimilis). Noted pt with ICD and barostim and on infliximab as outpt. Recommended treatment is Rocephin 2gm IV q24    Name of physician (or Provider) Contacted: Dr. Lucile Shutters  Current antibiotics: Rocephin 2gm IV q24hand Vancomycin 1gm IV q12h  Changes to prescribed antibiotics recommended:  Consider d/c Vancomycin - Dr. Lucile Shutters will defer to a.m. rounding team  Results for orders placed or performed during the hospital encounter of 11/29/20  Blood Culture ID Panel (Reflexed) (Collected: 11/29/2020  1:04 PM)  Result Value Ref Range   Enterococcus faecalis NOT DETECTED NOT DETECTED   Enterococcus Faecium NOT DETECTED NOT DETECTED   Listeria monocytogenes NOT DETECTED NOT DETECTED   Staphylococcus species NOT DETECTED NOT DETECTED   Staphylococcus aureus (BCID) NOT DETECTED NOT DETECTED   Staphylococcus epidermidis NOT DETECTED NOT DETECTED   Staphylococcus lugdunensis NOT DETECTED NOT DETECTED   Streptococcus species DETECTED (A) NOT DETECTED   Streptococcus agalactiae NOT DETECTED NOT DETECTED   Streptococcus pneumoniae NOT DETECTED NOT DETECTED   Streptococcus pyogenes NOT DETECTED NOT DETECTED   A.calcoaceticus-baumannii NOT DETECTED NOT DETECTED   Bacteroides fragilis NOT DETECTED NOT DETECTED   Enterobacterales NOT DETECTED NOT DETECTED   Enterobacter cloacae complex NOT DETECTED NOT DETECTED   Escherichia coli NOT DETECTED NOT DETECTED   Klebsiella aerogenes NOT DETECTED NOT DETECTED   Klebsiella oxytoca NOT DETECTED NOT DETECTED   Klebsiella pneumoniae NOT DETECTED NOT DETECTED   Proteus species NOT DETECTED NOT DETECTED   Salmonella species NOT  DETECTED NOT DETECTED   Serratia marcescens NOT DETECTED NOT DETECTED   Haemophilus influenzae NOT DETECTED NOT DETECTED   Neisseria meningitidis NOT DETECTED NOT DETECTED   Pseudomonas aeruginosa NOT DETECTED NOT DETECTED   Stenotrophomonas maltophilia NOT DETECTED NOT DETECTED   Candida albicans NOT DETECTED NOT DETECTED   Candida auris NOT DETECTED NOT DETECTED   Candida glabrata NOT DETECTED NOT DETECTED   Candida krusei NOT DETECTED NOT DETECTED   Candida parapsilosis NOT DETECTED NOT DETECTED   Candida tropicalis NOT DETECTED NOT DETECTED   Cryptococcus neoformans/gattii NOT DETECTED NOT DETECTED    Sherlon Handing, PharmD, BCPS Please see amion for complete clinical pharmacist phone list 11/30/2020  6:12 AM

## 2020-11-30 NOTE — Progress Notes (Signed)
ETT advanced 2cm per MD order. ETT secured at 27 at the lips.

## 2020-11-30 NOTE — Consult Note (Signed)
Date of Admission:  11/29/2020          Reason for Consult: Streptococcus bacteremia     Referring Provider: Dr. Glori Bickers    Assessment:  Streptococcus bacteremia: Patient has had chills intermittently for 2 weeks prior to admission, in addition he became septic (febrile with leukocytosis and elevated lactate and inflammatory markers) s/p LHC/RHC on the day of admission.  Eventually developed acute hypoxemic and hypercapnic respiratory failure likely secondary to his bloodstream infection.  Blood cultures (4 out of 4) grew GPCs in chains. BCID was notable for streptococcus, but further speciation was not displayed.  However Enterococcus, group A strep, Streptococcus agalactiae, and strep pneumonia were not present.  We will await further speciation.  Possibly related to dental infection though patient reports he sees a dentist regularly (every 6 months).  He also has no history of recent instrumentation of the GI tract.  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  Patient does have an AICD and a barostim. His AICD will need to be removed. Per cardiology, barostim device does not require removal. Patient also has hardware in his lumbar spine s/p fusion.   Plan:  Septic shock secondary to Streptococcus bacteremia: Repeat blood cultures were obtained this AM.  We will follow-up speciation and sensitivities of patient's cultures.  Continue ceftriaxone for now.  Patient will likely require 6 weeks of antibiotics after removal of his AICD.  Blood culture should be repeated at that time, prior to further device placement.  Panorex and/or CT abdomen pelvis may be required to reveal source of bacteremia.  Wean pressors and ventilator settings per ICU. Acute hypoxic/hypercapnic RF: Vent management per critical care team.   Principal Problem:   Acute respiratory failure with hypoxia and hypercapnia  (HCC) Active Problems:   Cardiogenic shock (HCC)   Ischemic cardiomyopathy   Dyspnea   Elevated brain natriuretic peptide (BNP) level   Elevated troponin   Hypokalemia   Hypomagnesemia   Normocytic anemia   Scheduled Meds:  atorvastatin  80 mg Oral Daily   Chlorhexidine Gluconate Cloth  6 each Topical Daily   docusate  100 mg Per Tube BID   enoxaparin (LOVENOX) injection  40 mg Subcutaneous Q24H   feeding supplement (PROSource TF)  45 mL Per Tube BID   feeding supplement (VITAL HIGH PROTEIN)  1,000 mL Per Tube Q24H   insulin aspart  0-15 Units Subcutaneous Q4H   pantoprazole (PROTONIX) IV  40 mg Intravenous Daily   polyethylene glycol  17 g Per Tube Daily   sodium chloride flush  3 mL Intravenous Q12H   sodium chloride flush  3 mL Intravenous Q12H   Continuous Infusions:  sodium chloride     sodium chloride     sodium chloride Stopped (11/30/20 0631)   cefTRIAXone (ROCEPHIN)  IV 2 g (11/30/20 0941)   dexmedetomidine (PRECEDEX) IV infusion Stopped (11/30/20 0037)   epinephrine Stopped (11/30/20 0220)   fentaNYL infusion INTRAVENOUS 50 mcg/hr (11/30/20 1000)   norepinephrine (LEVOPHED) Adult infusion 24 mcg/min (11/30/20 1000)   propofol (DIPRIVAN) infusion 35 mcg/kg/min (11/30/20 1000)   vasopressin 0.03 Units/min (11/30/20 1000)   PRN Meds:.sodium chloride, sodium chloride, acetaminophen, fentaNYL (SUBLIMAZE) injection, fentaNYL (SUBLIMAZE) injection, ondansetron (ZOFRAN) IV, sodium chloride flush, sodium chloride flush  HPI: Brandon [name] is a 65 y.o. male with a past medical history of coronary artery disease status post PCI x2  most recent 04/2019 with stent to LAD, ischemic cardiomyopathy most recent EF 20% s/p AICD 08/2019, status post [place] 12/2019, moderately severe aortic stenosis.  History per EMR as patient intubated and sedated.  Patient presented to cardiac Cath Lab electively after evaluation for TAVR procedure earlier this month.  Structural cardiology  wanted to better assess his intracardiac hemodynamics, assess severity of his aortic stenosis, and to reexamine his coronary anatomy.  Patient underwent right and left heart catheterization without any procedural complications.  However when patient was in cardiac short stay he developed rigors, vomiting, fever to 102.8 F, O2 sats in the 70s, tachycardic, and hypotensive.  On admission labs patient was hypercapnic and hypoxic, had a leukocytosis, elevated lactate and procalcitonin. Blood cultures grew GPC in chains in 4 out of 4 cultures. His respiratory status eventually worsened leading to patient being intubated. He also required vasopressor support. He was started on vancomycin, piperacillin-tazobactam, and ceftriaxone. Once his BCID returned the patient was narrowed to ceftriaxone. A TEE was obtained which showed no evidence of vegetation but was notable for aortic stenosis and fibroelastoma of the aortic valve. In addition patient's ICD leads did not appear infected.   Of note, patient reported to primary team prior to intubation that he was experiencing chills intermittently for the past two weeks.   Review of Systems: Unable to assess given patient intubated and sedated.   Past Medical History:  Diagnosis Date   AICD (automatic cardioverter/defibrillator) present 08/31/2019   Ankylosing spondylitis ([place])    Arthritis    BENIGN PROSTATIC HYPERTROPHY 06/07/2008   CHF (congestive heart failure) ([place])    COLONIC POLYPS, HX OF 06/07/2008   Depression    H/O hiatal hernia    Heart failure ([place])    HYPERLIPIDEMIA 06/07/2008   HYPERTENSION 06/07/2008   Myocardial infarction [facility]) 2005   NSTEMI, s/p LAD stent   NEPHROLITHIASIS, HX OF 06/07/2008   STEMI (ST elevation myocardial infarction) ([place]) 04/27/2019    Social History   Tobacco Use   Smoking status: Former    Packs/day: 1.00    Years: 40.00    Pack years: 40.00    Types: Cigarettes    Quit date: 07/26/2019    Years since quitting:  1.3   Smokeless tobacco: Never   Tobacco comments:    1-1.5 pks per year x 40-45 yrs  Vaping Use   Vaping Use: Never used  Substance Use Topics   Alcohol use: Yes    Alcohol/week: 7.0 standard drinks    Types: 7 Shots of liquor per week    Comment: "2 vodka tonics a night"   Drug use: Yes    Comment: occasionally - CBD oil    Family History  Problem Relation Age of Onset   Depression Father    Arthritis Sister        RA   Heart failure Mother    Other Neg Hx    Colon cancer Neg Hx    Esophageal cancer Neg Hx    Rectal cancer Neg Hx    Stomach cancer Neg Hx    Colon polyps Neg Hx    No Known Allergies  OBJECTIVE: Blood pressure 92/71, pulse 95, temperature 98.7 F (37.1 C), temperature source Oral, resp. rate (!) 24, height 6' (1.829 m), weight 94.6 kg, SpO2 100 %.  Physical Exam Constitutional:      Appearance: He is ill-appearing.     Comments: Intubated and sedated   HENT:     Mouth/Throat:  Comments: ETT in place, unable to examine dentition Neck:     Comments: R IJ CVC in place  Cardiovascular:     Rate and Rhythm: Normal rate and regular rhythm.     Heart sounds: Normal heart sounds.  Pulmonary:     Comments: Mechanically ventilated Abdominal:     General: There is no distension.     Palpations: Abdomen is soft.  Skin:    General: Skin is warm and dry.     Findings: No lesion or rash.    Lab Results Lab Results  Component Value Date   WBC 23.3 (H) 11/30/2020   HGB 11.2 (L) 11/30/2020   HCT 33.0 (L) 11/30/2020   MCV 85.7 11/30/2020   PLT 297 11/30/2020    Lab Results  Component Value Date   CREATININE 1.13 11/30/2020   BUN 17 11/30/2020   NA 133 (L) 11/30/2020   K 4.1 11/30/2020   CL 106 11/30/2020   CO2 15 (L) 11/30/2020    Lab Results  Component Value Date   ALT 70 (H) 11/30/2020   AST 63 (H) 11/30/2020   ALKPHOS 66 11/30/2020   BILITOT 1.3 (H) 11/30/2020     Microbiology: Recent Results (from the past 240 hour(s))  Culture,  blood (routine x 2)     Status: None (Preliminary result)   Collection Time: 11/29/20 12:54 PM   Specimen: BLOOD  Result Value Ref Range Status   Specimen Description BLOOD SITE NOT SPECIFIED  Final   Special Requests   Final    BOTTLES DRAWN AEROBIC AND ANAEROBIC Blood Culture adequate volume   Culture  Setup Time   Final    GRAM POSITIVE COCCI IN CHAINS IN BOTH AEROBIC AND ANAEROBIC BOTTLES CRITICAL VALUE NOTED.  VALUE IS CONSISTENT WITH PREVIOUSLY REPORTED AND CALLED VALUE. Performed at [facility], [place] [address] [place], [place] [address]    Culture PENDING  Incomplete   Report Status PENDING  Incomplete  Culture, blood (routine x 2)     Status: None (Preliminary result)   Collection Time: 11/29/20  1:04 PM   Specimen: BLOOD RIGHT HAND  Result Value Ref Range Status   Specimen Description BLOOD RIGHT HAND  Final   Special Requests   Final    BOTTLES DRAWN AEROBIC AND ANAEROBIC Blood Culture results may not be optimal due to an excessive volume of blood received in culture bottles   Culture  Setup Time   Final    GRAM POSITIVE COCCI IN CHAINS IN BOTH AEROBIC AND ANAEROBIC BOTTLES CRITICAL RESULT CALLED TO, READ BACK BY AND VERIFIED WITH: C. AMEND,PHARMD [id number] 11/30/2020 T. TYSOR Performed at [facility], [place] [address], [place], [place] [address]    Culture GRAM POSITIVE COCCI  Final   Report Status PENDING  Incomplete  Blood Culture ID Panel (Reflexed)     Status: Abnormal   Collection Time: 11/29/20  1:04 PM  Result Value Ref Range Status   Enterococcus faecalis NOT DETECTED NOT DETECTED Final   Enterococcus Faecium NOT DETECTED NOT DETECTED Final   Listeria monocytogenes NOT DETECTED NOT DETECTED Final   Staphylococcus species NOT DETECTED NOT DETECTED Final   Staphylococcus aureus (BCID) NOT DETECTED NOT DETECTED Final   Staphylococcus epidermidis NOT DETECTED NOT DETECTED Final   Staphylococcus lugdunensis NOT DETECTED NOT DETECTED Final   Streptococcus  species DETECTED (A) NOT DETECTED Final    Comment: Not Enterococcus species, Streptococcus agalactiae, Streptococcus pyogenes, or Streptococcus pneumoniae. CRITICAL RESULT CALLED TO, READ  BACK BY AND VERIFIED WITH: C. AMEND,PHARMD [id number] 11/30/2020 T. TYSOR    Streptococcus agalactiae NOT DETECTED NOT DETECTED Final   Streptococcus pneumoniae NOT DETECTED NOT DETECTED Final   Streptococcus pyogenes NOT DETECTED NOT DETECTED Final   A.calcoaceticus-baumannii NOT DETECTED NOT DETECTED Final   Bacteroides fragilis NOT DETECTED NOT DETECTED Final   Enterobacterales NOT DETECTED NOT DETECTED Final   Enterobacter cloacae complex NOT DETECTED NOT DETECTED Final   Escherichia coli NOT DETECTED NOT DETECTED Final   Klebsiella aerogenes NOT DETECTED NOT DETECTED Final   Klebsiella oxytoca NOT DETECTED NOT DETECTED Final   Klebsiella pneumoniae NOT DETECTED NOT DETECTED Final   Proteus species NOT DETECTED NOT DETECTED Final   Salmonella species NOT DETECTED NOT DETECTED Final   Serratia marcescens NOT DETECTED NOT DETECTED Final   Haemophilus influenzae NOT DETECTED NOT DETECTED Final   Neisseria meningitidis NOT DETECTED NOT DETECTED Final   Pseudomonas aeruginosa NOT DETECTED NOT DETECTED Final   Stenotrophomonas maltophilia NOT DETECTED NOT DETECTED Final   Candida albicans NOT DETECTED NOT DETECTED Final   Candida auris NOT DETECTED NOT DETECTED Final   Candida glabrata NOT DETECTED NOT DETECTED Final   Candida krusei NOT DETECTED NOT DETECTED Final   Candida parapsilosis NOT DETECTED NOT DETECTED Final   Candida tropicalis NOT DETECTED NOT DETECTED Final   Cryptococcus neoformans/gattii NOT DETECTED NOT DETECTED Final    Comment: Performed at [facility], [address] [place], [place] [address]  [name] Next Gen by PCR, Nasal     Status: None   Collection Time: 11/29/20  2:01 PM  Result Value Ref Range Status   [name] by PCR Next Gen NOT DETECTED NOT DETECTED Final    Comment:  (NOTE) The GeneXpert [name] Assay (FDA approved for NASAL specimens only), is one component of a comprehensive [name] colonization surveillance program. It is not intended to diagnose [name] infection nor to guide or monitor treatment for [name] infections. Test performance is not FDA approved in patients less than 61 years old. Performed at [facility], [place] [address], [place], [place] [address]     Princ[place] L Carter, MD PGY-2 Internal Medicine [phone] pager  11/30/2020, 11:31 AM

## 2020-11-30 NOTE — Progress Notes (Signed)
NAME:  Brandon [name], MRN:  [id number], DOB:  [birth year], LOS: 1 ADMISSION DATE:  11/29/2020, CONSULTATION DATE:  11/30/20 REFERRING MD:  cardiology, CHIEF COMPLAINT:  respiratory failure   History of Present Illness:  65 y.o. M with PMH of HFrEF, CAD s/p previous MI with [facility] Jude ICD and barrel stem, hypertension, hyperlipidemia also under TAVR consideration who presented for scheduled right and left heart cath on 7/20, which showed stable two-vessel CAD , EF 20% and heavily calcified aortic valve.   After his procedure, he developed chills, nausea, vomiting and shortness of breath.  He reported nausea and vomiting for the last several weeks.  His temperature climbed to 102F and oxygen saturations dropped into the 70%'s along with sinus tachycardia.  He was admitted and placed on nonrebreather along with vancomycin and ceftriaxone, overnight he developed progressive respiratory distress and tachycardia, PCCM consulted and patient ultimately required intubation and vasopressor support  Pertinent  Medical History   has a past medical history of AICD (automatic cardioverter/defibrillator) present (08/31/2019), Ankylosing spondylitis ([place]), Arthritis, BENIGN PROSTATIC HYPERTROPHY (06/07/2008), CHF (congestive heart failure) ([place]), COLONIC POLYPS, HX OF (06/07/2008), Depression, H/O hiatal hernia, Heart failure ([place]), HYPERLIPIDEMIA (06/07/2008), HYPERTENSION (06/07/2008), Myocardial infarction ([place]) (2005), NEPHROLITHIASIS, HX OF (06/07/2008), and STEMI (ST elevation myocardial infarction) ([place]) (04/27/2019).  Significant Hospital Events: Including procedures, antibiotic start and stop dates in addition to other pertinent events   7/20 underwent right and left heart cath, postop decompensation and admission.  Vancomycin and ceftriaxone initiated 7/21 respiratory distress overnight along with shock, PCCM consulted and patient intubated along with placement of central line and arterial line  Interim History  / Subjective:  Positive blood cultures this morning. Remains intubated, sedated.  Objective   Blood pressure (!) 88/73, pulse 92, temperature 98.8 F (37.1 C), resp. rate (!) 24, height 6' (1.829 m), weight 94.6 kg, SpO2 100 %.    Vent Mode: PRVC FiO2 (%):  [60 %-70 %] 60 % Set Rate:  [20 bmp-24 bmp] 24 bmp Vt Set:  [620 mL] 620 mL PEEP:  [10 cmH20-12 cmH20] 10 cmH20 Plateau Pressure:  [23 cmH20-28 cmH20] 23 cmH20   Intake/Output Summary (Last 24 hours) at 11/30/2020 0800 Last data filed at 11/30/2020 0600 Gross per 24 hour  Intake 1442.83 ml  Output 1650 ml  Net -207.17 ml    Filed Weights   11/29/20 0628 11/30/20 0600  Weight: 83 kg 94.6 kg   General: critically ill appearing man lying in bed in NAD HEENT: [place]/AT, eyes anicteric, ETT in place.  Neuro: RASS -5, +cough reflex CV: S1S2, RRR PULM: rhales bilaterally, minimal ETT secretions. GI:  soft, NT Extremities: no significant peripheral edema, no cyanosis Skin: cool feet, no rashes  [place] 70% CXR> bilateral opacities, most dense in RLL  Resolved Hospital Problem list     Assessment & Plan:   Acute hypoxic respiratory failure requiring MV, likely aspiration pneumonitis Failed BiPAP and required intubation -LTVV, 4-8cc/kg IBW with goal Pplat<30 and DP<15 -VAP prevention protocol -PAD protocol for sedation -wean FiO2 as able to maintain SpO2 >90%  Septic shock due to strep bacteremia, likely aortic valve endocarditis Has had weeks of n/v per epic and pt's family, developed fever, chills, tachycardia leukocytosis and hypotension after admission Cholelithiasis noted on abdominal US, though initial LFT's and bili were unremarkable P: -TEE today -ID consult -con't antibiotics-- per ID- ceftriaxone and vanc -will need ICD removed -repeat blood cultures -vasopressors as required to maintain MAP >65  Chronic HFrEF due to ICM  Severe AS Troponin elevation- likely demand ischemia S/P [employer] ICD and Barostim in  place -holding GDMT due to shock -tele monitoring -Management per cardiology and advanced heart failure -Continue Aspirin, statin daily  Metabolic acidosis due to sepsis -follow up BMP this afternoon  Hyponatermia, chronic due to heart failure  -monitor  Hypokalemia, hypomagnesemia, hypocalcemia -repleted -monitor  Immunocompromised with infliximab for RA -hold all immunosuppression -no PTA steroids to increase risk of adrenal insufficiency  Best Practice (right click and "Reselect all SmartList Selections" daily)   Diet/type: tubefeeds DVT prophylaxis: LMWH GI prophylaxis: PPI Lines: Central line Foley:  Yes, and it is still needed Code Status:  full code Last date of multidisciplinary goals of care discussion [per primary]  Labs   CBC: Recent Labs  Lab 11/29/20 1310 11/29/20 2221 11/29/20 2240 11/29/20 2340 11/30/20 0144 11/30/20 0237  WBC 11.1*  --  24.1*  --  26.1*  --   NEUTROABS  --   --   --   --  23.7*  --   HGB 11.8* 13.3 12.2* 12.9* 11.7* 11.2*  HCT 36.6* 39.0 38.3* 38.0* 35.2* 33.0*  MCV 87.4  --  89.5  --  87.3  --   PLT 274  --  342  --  322  --      Basic Metabolic Panel: Recent Labs  Lab 11/29/20 1310 11/29/20 2221 11/29/20 2240 11/29/20 2340 11/29/20 2347 11/30/20 0144 11/30/20 0237 11/30/20 0523  NA 133*   < > 130* 133*  --  130* 133* 131*  K 2.9*   < > 2.6* 3.5  --  4.0 4.2 4.4  CL 97*  --  98  --   --  100  --  106  CO2 24  --  21*  --   --  21*  --  15*  GLUCOSE 111*  --  207*  --   --  196*  --  191*  BUN 11  --  11  --   --  15  --  17  CREATININE 0.89  --  1.05  --   --  1.20  --  1.13  CALCIUM 8.7*  --  7.5*  --   --  7.8*  --  7.9*  MG  --   --   --   --  1.6*  --   --   --    < > = values in this interval not displayed.    GFR: Estimated Creatinine Clearance: 75.7 mL/min (by C-G formula based on SCr of 1.13 mg/dL). Recent Labs  Lab 11/29/20 1310 11/29/20 1441 11/29/20 2228 11/29/20 2240 11/30/20 0144  11/30/20 0523  PROCALCITON <0.10  --   --   --   --  1.05  WBC 11.1*  --   --  24.1* 26.1*  --   LATICACIDVEN  --  2.3* 3.8*  --  1.7  --      Liver Function Tests: Recent Labs  Lab 11/29/20 1310  AST 36  ALT 56*  ALKPHOS 71  BILITOT 1.1  PROT 7.4  ALBUMIN 3.0*    Recent Labs  Lab 11/30/20 0522  LIPASE 47   No results for input(s): AMMONIA in the last 168 hours.  ABG    Component Value Date/Time   PHART 7.365 11/30/2020 0237   PCO2ART 33.7 11/30/2020 0237   PO2ART 224 (H) 11/30/2020 0237   HCO3 19.2 (L) 11/30/2020 0237   TCO2 20 (L) 11/30/2020 0237   ACIDBASEDEF  5.0 (H) 11/30/2020 0237   O2SAT 100.0 11/30/2020 0237      This patient is critically ill with multiple organ system failure which requires frequent high complexity decision making, assessment, support, evaluation, and titration of therapies. This was completed through the application of advanced monitoring technologies and extensive interpretation of multiple databases. During this encounter critical care time was devoted to patient care services described in this note for 39 minutes.  Julian Hy, DO 11/30/20 9:42 AM [facility]

## 2020-12-01 ENCOUNTER — Encounter (HOSPITAL_COMMUNITY): Payer: Self-pay

## 2020-12-01 ENCOUNTER — Encounter (HOSPITAL_COMMUNITY): Payer: Medicare Other

## 2020-12-01 ENCOUNTER — Inpatient Hospital Stay (HOSPITAL_COMMUNITY): Payer: Medicare Other

## 2020-12-01 DIAGNOSIS — J90 Pleural effusion, not elsewhere classified: Secondary | ICD-10-CM | POA: Diagnosis not present

## 2020-12-01 DIAGNOSIS — R609 Edema, unspecified: Secondary | ICD-10-CM

## 2020-12-01 DIAGNOSIS — T17908A Unspecified foreign body in respiratory tract, part unspecified causing other injury, initial encounter: Secondary | ICD-10-CM

## 2020-12-01 DIAGNOSIS — K8020 Calculus of gallbladder without cholecystitis without obstruction: Secondary | ICD-10-CM

## 2020-12-01 DIAGNOSIS — R570 Cardiogenic shock: Secondary | ICD-10-CM | POA: Diagnosis not present

## 2020-12-01 DIAGNOSIS — I358 Other nonrheumatic aortic valve disorders: Secondary | ICD-10-CM

## 2020-12-01 DIAGNOSIS — K8050 Calculus of bile duct without cholangitis or cholecystitis without obstruction: Secondary | ICD-10-CM

## 2020-12-01 DIAGNOSIS — T827XXA Infection and inflammatory reaction due to other cardiac and vascular devices, implants and grafts, initial encounter: Secondary | ICD-10-CM | POA: Diagnosis not present

## 2020-12-01 DIAGNOSIS — T80219D Unspecified infection due to central venous catheter, subsequent encounter: Secondary | ICD-10-CM | POA: Diagnosis not present

## 2020-12-01 DIAGNOSIS — L8990 Pressure ulcer of unspecified site, unspecified stage: Secondary | ICD-10-CM | POA: Insufficient documentation

## 2020-12-01 DIAGNOSIS — T827XXD Infection and inflammatory reaction due to other cardiac and vascular devices, implants and grafts, subsequent encounter: Secondary | ICD-10-CM

## 2020-12-01 DIAGNOSIS — J9601 Acute respiratory failure with hypoxia: Secondary | ICD-10-CM | POA: Diagnosis not present

## 2020-12-01 DIAGNOSIS — E440 Moderate protein-calorie malnutrition: Secondary | ICD-10-CM | POA: Insufficient documentation

## 2020-12-01 DIAGNOSIS — J9602 Acute respiratory failure with hypercapnia: Secondary | ICD-10-CM | POA: Diagnosis not present

## 2020-12-01 LAB — CBC WITH DIFFERENTIAL/PLATELET
Abs Immature Granulocytes: 0.09 10*3/uL — ABNORMAL HIGH (ref 0.00–0.07)
Basophils Absolute: 0 10*3/uL (ref 0.0–0.1)
Basophils Relative: 0 %
Eosinophils Absolute: 0 10*3/uL (ref 0.0–0.5)
Eosinophils Relative: 0 %
HCT: 29.2 % — ABNORMAL LOW (ref 39.0–52.0)
Hemoglobin: 9.7 g/dL — ABNORMAL LOW (ref 13.0–17.0)
Immature Granulocytes: 1 %
Lymphocytes Relative: 6 %
Lymphs Abs: 0.9 10*3/uL (ref 0.7–4.0)
MCH: 28.8 pg (ref 26.0–34.0)
MCHC: 33.2 g/dL (ref 30.0–36.0)
MCV: 86.6 fL (ref 80.0–100.0)
Monocytes Absolute: 1 10*3/uL (ref 0.1–1.0)
Monocytes Relative: 7 %
Neutro Abs: 12.6 10*3/uL — ABNORMAL HIGH (ref 1.7–7.7)
Neutrophils Relative %: 86 %
Platelets: 213 10*3/uL (ref 150–400)
RBC: 3.37 MIL/uL — ABNORMAL LOW (ref 4.22–5.81)
RDW: 14.6 % (ref 11.5–15.5)
WBC: 14.7 10*3/uL — ABNORMAL HIGH (ref 4.0–10.5)
nRBC: 0 % (ref 0.0–0.2)

## 2020-12-01 LAB — ECHO TEE
AV Mean grad: 18 mmHg
AV Peak grad: 33.2 mmHg
Ao pk vel: 2.88 m/s

## 2020-12-01 LAB — BASIC METABOLIC PANEL
Anion gap: 7 (ref 5–15)
BUN: 22 mg/dL (ref 8–23)
CO2: 19 mmol/L — ABNORMAL LOW (ref 22–32)
Calcium: 8 mg/dL — ABNORMAL LOW (ref 8.9–10.3)
Chloride: 107 mmol/L (ref 98–111)
Creatinine, Ser: 0.95 mg/dL (ref 0.61–1.24)
GFR, Estimated: >60 mL/min (ref >60)
Glucose, Bld: 139 mg/dL — ABNORMAL HIGH (ref 70–99)
Potassium: 4.1 mmol/L (ref 3.5–5.1)
Sodium: 133 mmol/L — ABNORMAL LOW (ref 135–145)

## 2020-12-01 LAB — GLUCOSE, CAPILLARY
Glucose-Capillary: 121 mg/dL — ABNORMAL HIGH (ref 70–99)
Glucose-Capillary: 122 mg/dL — ABNORMAL HIGH (ref 70–99)
Glucose-Capillary: 126 mg/dL — ABNORMAL HIGH (ref 70–99)
Glucose-Capillary: 127 mg/dL — ABNORMAL HIGH (ref 70–99)
Glucose-Capillary: 138 mg/dL — ABNORMAL HIGH (ref 70–99)
Glucose-Capillary: 146 mg/dL — ABNORMAL HIGH (ref 70–99)

## 2020-12-01 LAB — HEPATITIS PANEL, ACUTE
HCV Ab: NONREACTIVE
Hep A IgM: NONREACTIVE
Hep B C IgM: NONREACTIVE
Hepatitis B Surface Ag: NONREACTIVE

## 2020-12-01 LAB — PHOSPHORUS: Phosphorus: 1.6 mg/dL — ABNORMAL LOW (ref 2.5–4.6)

## 2020-12-01 LAB — COOXEMETRY PANEL
Carboxyhemoglobin: 0.9 % (ref 0.5–1.5)
Methemoglobin: 0.9 % (ref 0.0–1.5)
O2 Saturation: 71.2 %
Total hemoglobin: 9.9 g/dL — ABNORMAL LOW (ref 12.0–16.0)

## 2020-12-01 LAB — MAGNESIUM: Magnesium: 2.1 mg/dL (ref 1.7–2.4)

## 2020-12-01 LAB — PROCALCITONIN: Procalcitonin: 0.72 ng/mL

## 2020-12-01 MED ORDER — METRONIDAZOLE 500 MG/100ML IV SOLN
500.0000 mg | Freq: Two times a day (BID) | INTRAVENOUS | Status: DC
  Administered 2020-12-01 – 2020-12-02 (×2): 500 mg via INTRAVENOUS
  Filled 2020-12-01 (×2): qty 100

## 2020-12-01 MED ORDER — ATORVASTATIN CALCIUM 80 MG PO TABS
80.0000 mg | ORAL_TABLET | Freq: Every day | ORAL | Status: DC
  Administered 2020-12-01 – 2020-12-02 (×2): 80 mg
  Filled 2020-12-01 (×2): qty 1

## 2020-12-01 MED ORDER — VANCOMYCIN HCL IN DEXTROSE 1-5 GM/200ML-% IV SOLN
1000.0000 mg | Freq: Two times a day (BID) | INTRAVENOUS | Status: DC
  Administered 2020-12-01 – 2020-12-02 (×2): 1000 mg via INTRAVENOUS
  Filled 2020-12-01 (×2): qty 200

## 2020-12-01 MED ORDER — PANTOPRAZOLE SODIUM 40 MG PO PACK
40.0000 mg | PACK | Freq: Every day | ORAL | Status: DC
  Administered 2020-12-01 – 2020-12-02 (×2): 40 mg
  Filled 2020-12-01 (×2): qty 20

## 2020-12-01 MED ORDER — ORAL CARE MOUTH RINSE
15.0000 mL | OROMUCOSAL | Status: DC
  Administered 2020-12-01 – 2020-12-02 (×5): 15 mL via OROMUCOSAL

## 2020-12-01 MED ORDER — VANCOMYCIN HCL IN DEXTROSE 1-5 GM/200ML-% IV SOLN: 1000.0000 mg | Freq: Two times a day (BID) | INTRAVENOUS | Status: DC

## 2020-12-01 MED ORDER — SODIUM CHLORIDE 0.9 % IV SOLN
2.0000 g | Freq: Three times a day (TID) | INTRAVENOUS | Status: DC
  Administered 2020-12-01: 2 g via INTRAVENOUS
  Filled 2020-12-01: qty 2

## 2020-12-01 MED ORDER — SODIUM CHLORIDE 0.9 % IV SOLN
2.0000 g | INTRAVENOUS | Status: DC
  Administered 2020-12-01 – 2020-12-11 (×11): 2 g via INTRAVENOUS
  Filled 2020-12-01: qty 20
  Filled 2020-12-01 (×2): qty 2
  Filled 2020-12-01: qty 20
  Filled 2020-12-01: qty 2
  Filled 2020-12-01 (×4): qty 20
  Filled 2020-12-01: qty 2
  Filled 2020-12-01 (×2): qty 20

## 2020-12-01 MED ORDER — POTASSIUM PHOSPHATES 15 MMOLE/5ML IV SOLN
20.0000 mmol | Freq: Once | INTRAVENOUS | Status: AC
  Administered 2020-12-01: 20 mmol via INTRAVENOUS
  Filled 2020-12-01: qty 6.67

## 2020-12-01 MED ORDER — ACETAMINOPHEN 160 MG/5ML PO SOLN
650.0000 mg | Freq: Four times a day (QID) | ORAL | Status: DC | PRN
  Administered 2020-12-01: 650 mg
  Filled 2020-12-01: qty 20.3

## 2020-12-01 MED ORDER — SODIUM CHLORIDE 0.9% IV SOLUTION: Freq: Once | INTRAVENOUS | Status: AC

## 2020-12-01 MED ORDER — CHLORHEXIDINE GLUCONATE 0.12% ORAL RINSE (MEDLINE KIT)
15.0000 mL | Freq: Two times a day (BID) | OROMUCOSAL | Status: DC
  Administered 2020-12-01 – 2020-12-02 (×3): 15 mL via OROMUCOSAL

## 2020-12-01 MED ORDER — VANCOMYCIN HCL 1500 MG/300ML IV SOLN
1500.0000 mg | Freq: Once | INTRAVENOUS | Status: AC
  Administered 2020-12-01: 1500 mg via INTRAVENOUS
  Filled 2020-12-01: qty 300

## 2020-12-01 NOTE — Plan of Care (Signed)

## 2020-12-01 NOTE — Progress Notes (Signed)
eLink Physician-Brief Progress Note Patient Name: Brandon [name] DOB: [birth year] MRN: [id number]   Date of Service  12/01/2020  HPI/Events of Note  Patient on 30%/P 10 with sat = 100%.  eICU Interventions  Plan: Decrease PEEP to 8.     Intervention Category Major Interventions: Respiratory failure - evaluation and management  [name] 12/01/2020, 9:02 PM

## 2020-12-01 NOTE — Progress Notes (Addendum)
Advanced Heart Failure Rounding Note  PCP-Cardiologist: [name], MD  [facility]: Dr. 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 Post procedure, developed severe rigors, n/v and fever to 102 then developed acute hypoxic respiratory failure, requiring intubation - 7/21 Blood Cx + for Strep. 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 Mean gradient 18. VR = 0.19 VTI = 7.5. RV mild HK  Subjective:    Intubated and sedated.  Abx changed to Rocephin. WBC 26>>14K. Febrile this am, Temp 103.   Lower pressor requirements today. NE down to 18. On VP 0.03.   Lactic acid 2.3>>3.8>>1.7  Co-ox pending. MAPs 80s   Currently sinus tach 110s.   No CVP set up. Wt up 24 lb.    Objective:   Weight Range: 94 kg Body mass index is 28.11 kg/m.   Vital Signs:   Temp:  [98.7 F (37.1 C)-99.9 F (37.7 C)] 99.2 F (37.3 C) (07/22 0400) Pulse Rate:  [92-97] 97 (07/21 1505) Resp:  [14-24] 24 (07/22 0615) BP: (86-105)/(60-73) 100/60 (07/22 0400) SpO2:  [98 %-100 %] 100 % (07/22 0615) Arterial Line BP: (89-146)/(59-140) 104/62 (07/22 0615) FiO2 (%):  [30 %-70 %] 30 % (07/22 0300) Weight:  [94 kg] 94 kg (07/22 0500) Last BM Date: 11/28/20  Weight change: Filed Weights   11/29/20 0628 11/30/20 0600 12/01/20 0500  Weight: 83 kg 94.6 kg 94 kg    Intake/Output:   Intake/Output Summary (Last 24 hours) at 12/01/2020 0714 Last data filed at 12/01/2020 0600 Gross per 24 hour  Intake 1241.66 ml  Output 700 ml  Net 541.66 ml      Physical Exam    General: intubated and sedated.  HEENT: + ETT  Neck: Supple. JVD  to jaw. Carotids 2+ bilat; no bruits. No lymphadenopathy or thyromegaly appreciated. Cor: PMI nondisplaced. Regular rhythm and tachy. No rubs, gallops or murmurs. + Zoll pads  Lungs: intubated and clear Abdomen: Soft, nontender, nondistended. No hepatosplenomegaly. No bruits or masses. Good bowel sounds. Extremities: No cyanosis, clubbing, rash, edema, cool distal extremities + SCDs Neuro: intubated and sedated   Telemetry   Currently  Sinus tach 110s   Labs    CBC Recent Labs    11/30/20 0144 11/30/20 0237 11/30/20 0833 11/30/20 0839 12/01/20 0236  WBC 26.1*  --  23.3*  --  14.7*  NEUTROABS 23.7*  --   --   --  12.6*  HGB 11.7*   < > 11.1* 11.2* 9.7*  HCT 35.2*   < > 33.0* 33.0* 29.2*  MCV 87.3  --  85.7  --  86.6  PLT 322  --  297  --  213   < > = values in this interval not displayed.   Basic Metabolic Panel Recent Labs    11/30/20 0833 11/30/20 0839 11/30/20 1555 12/01/20 0236  NA  --    < > 131* 133*  K  --    < > 3.8 4.1  CL  --   --  105 107  CO2  --   --  18* 19*  GLUCOSE  --   --  143* 139*  BUN  --   --  19 22  CREATININE  --   --  1.04 0.95  CALCIUM  --   --  7.9* 8.0*  MG 2.1  --   --  2.1  PHOS  --   --   --  1.6*   < > = values in this interval not displayed.   Liver Function Tests Recent Labs    11/29/20 1310 11/30/20 0833  AST 36 63*  ALT 56* 70*  ALKPHOS 71 66  BILITOT 1.1 1.3*  PROT 7.4 6.3*  ALBUMIN 3.0* 2.3*   Recent Labs    11/30/20 0522  LIPASE 47   Cardiac Enzymes No results for input(s): CKTOTAL, CKMB, CKMBINDEX, TROPONINI in the last 72 hours.  BNP: BNP (last 3 results) Recent Labs    11/01/20 1224 11/29/20 2240 11/30/20 0144  BNP 462.1* 1,586.1* 2,653.1*    ProBNP (last 3 results) Recent Labs    03/23/20 0922  PROBNP 520*     D-Dimer No results for input(s): DDIMER in the last 72 hours. Hemoglobin A1C Recent Labs    11/30/20 0833  HGBA1C 6.4*   Fasting Lipid Panel Recent Labs    11/30/20 0523   TRIG 99   Thyroid Function Tests No results for input(s): TSH, T4TOTAL, T3FREE, THYROIDAB in the last 72 hours.  Invalid input(s): FREET3  Other results:   Imaging    DG CHEST PORT 1 VIEW  Result Date: 12/01/2020 CLINICAL DATA:  Respiratory failure. EXAM: PORTABLE CHEST 1 VIEW COMPARISON:  11/30/2020 FINDINGS: 0514 hours. Endotracheal tube tip is approximately 5.0 cm above the base of the carina. The NG tube passes into the stomach although the distal tip position is not included on the film. The right IJ central line tip overlies the mid SVC level. Persistent diffuse interstitial lung opacity. The cardiopericardial silhouette is within normal limits for size. Left-sided pacer/AICD again noted with right-sided stimulator device evident. Telemetry leads overlie the chest. IMPRESSION: 1. Stable exam. Persistent diffuse interstitial lung disease suggesting edema. 2. Support apparatus as described. Electronically Signed   By: Misty Stanley M.D.   On: 12/01/2020 06:24   US Abdomen Limited RUQ (LIVER/GB)  Result Date: 11/30/2020 CLINICAL DATA:  Cholelithiasis. EXAM: ULTRASOUND ABDOMEN LIMITED RIGHT UPPER QUADRANT COMPARISON:  None. FINDINGS: Gallbladder: Cholelithiasis is noted without gallbladder wall thickening or pericholecystic fluid. No sonographic Murphy's sign is noted. Common bile duct: Diameter: 3 mm which is within normal limits. Liver: No focal lesion identified. Within normal limits in parenchymal echogenicity. Portal vein is patent on color Doppler imaging with normal direction of blood flow towards the liver. Other: None. IMPRESSION: Cholelithiasis without cholecystitis. No other abnormality seen in the right upper quadrant of the abdomen. Electronically Signed   By: Marijo Conception M.D.   On: 11/30/2020 11:14     Medications:     Scheduled Medications:  atorvastatin  80 mg Oral Daily   Chlorhexidine Gluconate Cloth  6 each Topical Daily   docusate  100 mg Per Tube BID    enoxaparin (LOVENOX) injection  40 mg Subcutaneous Q24H   feeding supplement (PROSource TF)  45 mL Per Tube BID   insulin aspart  0-15 Units Subcutaneous Q4H   pantoprazole (PROTONIX) IV  40 mg Intravenous Daily   polyethylene glycol  17 g Per Tube Daily   sodium chloride flush  3 mL Intravenous Q12H   sodium chloride flush  3 mL Intravenous Q12H    Infusions:  sodium  chloride     sodium chloride     sodium chloride Stopped (11/30/20 0631)   cefTRIAXone (ROCEPHIN)  IV 2 g (11/30/20 0941)   dexmedetomidine (PRECEDEX) IV infusion Stopped (11/30/20 0037)   epinephrine Stopped (11/30/20 0220)   feeding supplement (VITAL 1.5 CAL) 1,000 mL (11/30/20 1806)   fentaNYL infusion INTRAVENOUS 50 mcg/hr (12/01/20 0600)   norepinephrine (LEVOPHED) Adult infusion 18 mcg/min (12/01/20 0600)   propofol (DIPRIVAN) infusion 40 mcg/kg/min (12/01/20 0600)   vasopressin 0.03 Units/min (12/01/20 0621)    PRN Medications: sodium chloride, sodium chloride, acetaminophen, fentaNYL (SUBLIMAZE) injection, fentaNYL (SUBLIMAZE) injection, ondansetron (ZOFRAN) IV, sodium chloride flush, sodium chloride flush    Patient Profile   65 year old with a history of h/o CAD s/p previous MI, HTN, HL, ST jude ICD, S/P Barostim, and chronic systolic heart failure, EF 20-25%.  Presented 7/20 for outpatient RHC as part of VAD w/u. Post procedure, developed severe rigors, n/v and fever to 102 several hours after cardiac catheterization while in short stay. IVFs given for hypotension, labs and blood cx drawn. Admitted to ICU and started on broad spectrum  abx. Later developed acute hypoxic respiratory failure, requiring intubation. Blood Cx + for Strep.   TEE w/ with no evidence of endocarditis. 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 Likely low-flow low gradient AS. Mean gradient 18. VR = 0.19 VTI = 7.5. LVEF 20%. RV mild HK  Assessment/Plan   Strep Bacteremia  - Blood cx + for Strep   - recent chest CT showed possible fibroelastoma on  AoV - TEE w/ with no evidence of endocarditis  - Abx changed to Rocephin per ID but ? Broadening given spike in temp - pan culture   - has ICD and BaroStim, will need devices extracted followed by IV abx x 6 wks  2. Septic Shock  - in setting of bacteremia - c/w abx per ID  - NE + VP for BP support, wean as tolerated to maintain MAP >65  - Lactic acid down, 3.8>1.7  3. Acute Hypoxic Respiratory Failure - intubated - Vent management per PCCM   4. Chronic Systolic Heart Failure, ICM  - Echo 12/20 EF post anterior MI 30 to 35%. - Echo 6/21 EF 25-30% RV ok  - Echo 05/19/20 EF 20-25% RV mildly HK. moderate AS  Mean gradient 13 AVA 1.2 cm2 DI 0.30 - s/p ICD and has Barostim. - Had RHC this admit with low filling pressures. CI marginal at 2.3 - hold eplerenone, entresto, farxiga, and carvedilol in setting of septic shock  - continue NE + VP for BP support - co-ox pending  - Wt up 24 lb w/ IVF resuscitation. SCr stable, begin gentle diuresis  - concerned he will need advanced therpaies. Likely not transplant candidate given age and lung disease. Being elevated for LVAD, but currently not a candidate for implant given active infection/ bacteremia.  - Spoke with ID. Plan ICD extraction, 6 weeks IV abx. If surveillance cultures clear can consider VAD implant.   5. CAD  -s/p anterior STEMI 12/20. LHC showed chronically occluded RCA (with L>>R collaterals) and thrombotic occlusion of proximal LAD. Underwent PCI of LAD. -LHC this admit with stable CAD -Need to continue aspirin. Could keep off plavix.  6. Aortic Stenosis - AS is severe on TEE.  - Likely low-flow low gradient AS. Mean gradient 18. VR = 0.19 VTI = 7.5 - Presence of fibroelastoma prevents TAVR   7. Elevated HS Trop  - 3,500>>4,843 - cath w/ Stable 2v CAD -  EKG w/ no STE  - suspect demand ischemia in setting of septic shock   Length of Stay: 2  Brandon Jester, PA-C  12/01/2020, 7:14 AM  Advanced Heart Failure  Team Pager [phone] (M-F; 7a - 5p)  Please contact [facility] for night-coverage after hours (5p -7a ) and weekends on [email or web address]  Agree with above. Remains intubated sedated. On NE and VP. Co-ox 71% Remains febrile to 103. 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 Audie Box). Weight up 22 pounds.   General:  Intubated/sedated HEENT: normal +ETT Neck: supple. no JVD. Carotids 2+ bilat; + bruits. No lymphadenopathy or thryomegaly appreciated. Cor: PMI nondisplaced. Tachy regular  2/6 AS Lungs: clear Abdomen: soft, nontender, nondistended. No hepatosplenomegaly. No bruits or masses. Good bowel sounds. Extremities: no cyanosis, clubbing, rash, tr edema Neuro: intubated/sedated  Remains intubated on dual pressors. Still spiking high fevers. Broaden abx to vanc/cefepime. Will do pan CT looking for possible septic emboli and other sources of fever/infection.   Wean pressors as tolerated. Set up cVP. Gentle diuresis as tolerated.   Will alert EP as he will need ICD explanted. Long-term plan would be for 6 weeks abx. If surveillance cultures negative for 4 weeks consider VAD placement.   CRITICAL CARE Performed by: Glori Bickers  Total critical care time: 35 minutes  Critical care time was exclusive of separately billable procedures and treating other patients.  Critical care was necessary to treat or prevent imminent or life-threatening deterioration.  Critical care was time spent personally by me (independent of midlevel providers or residents) on the following activities: development of treatment plan with patient and/or surrogate as well as nursing, discussions with consultants, evaluation of patient's response to treatment, examination of patient, obtaining history from patient or surrogate, ordering and performing treatments and interventions, ordering and review of laboratory studies, ordering and review of  radiographic studies, pulse oximetry and re-evaluation of patient's condition.  Glori Bickers, MD  8:28 AM

## 2020-12-01 NOTE — Progress Notes (Signed)
NAME:  Brandon [name], MRN:  [id number], DOB:  April 04, 1956, LOS: 2 ADMISSION DATE:  11/29/2020, CONSULTATION DATE:  12/01/20 REFERRING MD:  cardiology, CHIEF COMPLAINT:  respiratory failure   History of Present Illness:  65 y.o. M with PMH of HFrEF, CAD s/p previous MI with [facility] Jude ICD and barrel stem, hypertension, hyperlipidemia also under TAVR consideration who presented for scheduled right and left heart cath on 7/20, which showed stable two-vessel CAD , EF 20% and heavily calcified aortic valve.   After his procedure, he developed chills, nausea, vomiting and shortness of breath.  He reported nausea and vomiting for the last several weeks.  His temperature climbed to 102F and oxygen saturations dropped into the 70%'s along with sinus tachycardia.  He was admitted and placed on nonrebreather along with vancomycin and ceftriaxone, overnight he developed progressive respiratory distress and tachycardia, PCCM consulted and patient ultimately required intubation and vasopressor support  Pertinent  Medical History   has a past medical history of AICD (automatic cardioverter/defibrillator) present (08/31/2019), Ankylosing spondylitis ([place]), Arthritis, BENIGN PROSTATIC HYPERTROPHY (06/07/2008), CHF (congestive heart failure) ([place]), COLONIC POLYPS, HX OF (06/07/2008), Depression, H/O hiatal hernia, Heart failure ([place]), HYPERLIPIDEMIA (06/07/2008), HYPERTENSION (06/07/2008), Myocardial infarction ([place]) (2005), NEPHROLITHIASIS, HX OF (06/07/2008), and STEMI (ST elevation myocardial infarction) ([place]) (04/27/2019).  Significant Hospital Events: Including procedures, antibiotic start and stop dates in addition to other pertinent events   7/20 underwent right and left heart cath, postop decompensation and admission.  Vancomycin and ceftriaxone initiated 7/21 respiratory distress overnight along with shock, PCCM consulted and patient intubated along with placement of central line and arterial line  Interim History  / Subjective:  Spiking fevers this AM to 103F. Intubated but opens eyes, not following commands.  Objective   Blood pressure 100/60, pulse (!) 115, temperature (!) 103.3 F (39.6 C), temperature source Oral, resp. rate (!) 24, height 6' (1.829 m), weight 94 kg, SpO2 99 %.    Vent Mode: PRVC FiO2 (%):  [30 %-50 %] 30 % Set Rate:  [24 bmp] 24 bmp Vt Set:  [259 mL] 620 mL PEEP:  [10 cmH20] 10 cmH20 Plateau Pressure:  [21 cmH20-23 cmH20] 23 cmH20   Intake/Output Summary (Last 24 hours) at 12/01/2020 5638 Last data filed at 12/01/2020 0700 Gross per 24 hour  Intake 1289.17 ml  Output 735 ml  Net 554.17 ml    Filed Weights   11/29/20 0628 11/30/20 0600 12/01/20 0500  Weight: 83 kg 94.6 kg 94 kg   No distress Poor dentition Lungs with rhonci worse on R Pupils small but reactive Heart sounds regular, ext warm  Coox 71% BMP benign WBC, Pct downtrending   Resolved Hospital Problem list     Assessment & Plan:  Immunocompromised septic shock- on infliximab as OP; in context of weeks of nausea/vomiting/fevers at home Strep bacteremia Poor dentition Acute hypoxemic respiratory failure with abnormal CXR- looks more like edema to me; question raised of aspiration Baseline severe cardiomyopathy- in workup for LVAD, 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, coox okay here; AICD in place Emphysema on CT  - CT Sinus/Chest/Abd/Pelvis given ongoing fevers in immunocompromised patient; CT 7/12 for comparison - Check LE dopplers - Will broaden back antibiotics for now until have information from Cts and culture maturity - F/u with CHF team regarding TEE findings - Keep on vent for now until fever curve improves and can get some diuresis - If nothing actionable on CT and still spiking fevers, need to consider what to  with AICD  Best Practice (right click and "Reselect all SmartList Selections" daily)   Diet/type: tubefeeds DVT prophylaxis: LMWH GI prophylaxis: PPI Lines:  Central line Foley:  Yes, and it is still needed Code Status:  full code Last date of multidisciplinary goals of care discussion [per primary]   Patient critically ill due to respiratory failure, shock, sepsis Interventions to address this today pressor and vent titration Risk of deterioration without these interventions is high  I personally spent 41 minutes providing critical care not including any separately billable procedures  Erskine Emery MD [facility]  Prefer epic messenger for cross cover needs If after hours, please call E-link

## 2020-12-01 NOTE — Progress Notes (Signed)
Subjective: Patient intubated and sedated.  Antibiotics:  Anti-infectives (From admission, onward)    Start     Dose/Rate Route Frequency Ordered Stop   12/01/20 2200  vancomycin (VANCOCIN) IVPB 1000 mg/200 mL premix        1,000 mg 200 mL/hr over 60 Minutes Intravenous Every 12 hours 12/01/20 0829     12/01/20 0915  vancomycin (VANCOREADY) IVPB 1500 mg/300 mL        1,500 mg 150 mL/hr over 120 Minutes Intravenous  Once 12/01/20 0818 12/01/20 1129   12/01/20 0915  ceFEPIme (MAXIPIME) 2 g in sodium chloride 0.9 % 100 mL IVPB        2 g 200 mL/hr over 30 Minutes Intravenous Every 8 hours 12/01/20 0818     11/30/20 1000  cefTRIAXone (ROCEPHIN) 2 g in sodium chloride 0.9 % 100 mL IVPB  Status:  Discontinued        2 g 200 mL/hr over 30 Minutes Intravenous Every 24 hours 11/29/20 1518 11/30/20 0514   11/30/20 0900  cefTRIAXone (ROCEPHIN) 2 g in sodium chloride 0.9 % 100 mL IVPB  Status:  Discontinued        2 g 200 mL/hr over 30 Minutes Intravenous Every 24 hours 11/30/20 0803 12/01/20 0812   11/30/20 0600  vancomycin (VANCOCIN) IVPB 1000 mg/200 mL premix  Status:  Discontinued        1,000 mg 200 mL/hr over 60 Minutes Intravenous Every 12 hours 11/29/20 1747 11/30/20 1020   11/30/20 0600  piperacillin-tazobactam (ZOSYN) IVPB 3.375 g  Status:  Discontinued        3.375 g 100 mL/hr over 30 Minutes Intravenous Every 6 hours 11/30/20 0514 11/30/20 0517   11/30/20 0530  piperacillin-tazobactam (ZOSYN) IVPB 3.375 g  Status:  Discontinued        3.375 g 12.5 mL/hr over 240 Minutes Intravenous Every 8 hours 11/30/20 0517 11/30/20 0803   11/30/20 0200  vancomycin (VANCOCIN) IVPB 1000 mg/200 mL premix  Status:  Discontinued        1,000 mg 200 mL/hr over 60 Minutes Intravenous Every 12 hours 11/29/20 1518 11/29/20 1747   11/29/20 1845  vancomycin (VANCOCIN) IVPB 1000 mg/200 mL premix        1,000 mg 200 mL/hr over 60 Minutes Intravenous  Once 11/29/20 1746 11/29/20 2136    11/29/20 1800  vancomycin (VANCOCIN) IVPB 1000 mg/200 mL premix  Status:  Discontinued        1,000 mg 200 mL/hr over 60 Minutes Intravenous  Once 11/29/20 1709 11/29/20 1746   11/29/20 1315  vancomycin (VANCOREADY) IVPB 1500 mg/300 mL  Status:  Discontinued        1,500 mg 150 mL/hr over 120 Minutes Intravenous  Once 11/29/20 1300 11/29/20 1304   11/29/20 1315  vancomycin (VANCOCIN) IVPB 1000 mg/200 mL premix  Status:  Discontinued        1,000 mg 200 mL/hr over 60 Minutes Intravenous  Once 11/29/20 1304 11/29/20 1746   11/29/20 1315  vancomycin (VANCOREADY) IVPB 500 mg/100 mL        500 mg 100 mL/hr over 60 Minutes Intravenous  Once 11/29/20 1304 11/29/20 1542   11/29/20 1300  cefTRIAXone (ROCEPHIN) 2 g in sodium chloride 0.9 % 100 mL IVPB        2 g 200 mL/hr over 30 Minutes Intravenous  Once 11/29/20 1259 11/29/20 1928   11/29/20 1256  vancomycin (VANCOCIN) 1-5 GM/200ML-% IVPB  Note to Pharmacy: Ladoris Gene   : cabinet override      11/29/20 1256 11/29/20 1302       Medications: Scheduled Meds:  atorvastatin  80 mg Per Tube Daily   Chlorhexidine Gluconate Cloth  6 each Topical Daily   docusate  100 mg Per Tube BID   enoxaparin (LOVENOX) injection  40 mg Subcutaneous Q24H   feeding supplement (PROSource TF)  45 mL Per Tube BID   insulin aspart  0-15 Units Subcutaneous Q4H   pantoprazole sodium  40 mg Per Tube Daily   polyethylene glycol  17 g Per Tube Daily   sodium chloride flush  3 mL Intravenous Q12H   sodium chloride flush  3 mL Intravenous Q12H   Continuous Infusions:  sodium chloride     sodium chloride     sodium chloride Stopped (11/30/20 0631)   ceFEPime (MAXIPIME) IV 2 g (12/01/20 0829)   dexmedetomidine (PRECEDEX) IV infusion Stopped (11/30/20 0037)   epinephrine Stopped (11/30/20 0220)   feeding supplement (VITAL 1.5 CAL) 1,000 mL (11/30/20 1806)   fentaNYL infusion INTRAVENOUS 50 mcg/hr (12/01/20 0700)   norepinephrine (LEVOPHED) Adult infusion 18  mcg/min (12/01/20 0700)   potassium PHOSPHATE IVPB (in mmol)     propofol (DIPRIVAN) infusion 30 mcg/kg/min (12/01/20 0946)   vancomycin     vasopressin 0.03 Units/min (12/01/20 0700)   PRN Meds:.sodium chloride, sodium chloride, acetaminophen (TYLENOL) oral liquid 160 mg/5 mL, acetaminophen, fentaNYL (SUBLIMAZE) injection, fentaNYL (SUBLIMAZE) injection, ondansetron (ZOFRAN) IV, sodium chloride flush, sodium chloride flush    Objective: Weight change: -0.6 kg  Intake/Output Summary (Last 24 hours) at 12/01/2020 1154 Last data filed at 12/01/2020 1000 Gross per 24 hour  Intake 1017.13 ml  Output 890 ml  Net 127.13 ml   Blood pressure 106/62, pulse 98, temperature (!) 100.6 F (38.1 C), resp. rate (!) 24, height 6' (1.829 m), weight 94 kg, SpO2 100 %. Temp:  [98.9 F (37.2 C)-103.3 F (39.6 C)] 100.6 F (38.1 C) (07/22 0930) Pulse Rate:  [97-115] 98 (07/22 1110) Resp:  [14-24] 24 (07/22 0731) BP: (86-106)/(60-71) 106/62 (07/22 1110) SpO2:  [98 %-100 %] 100 % (07/22 1110) Arterial Line BP: (93-146)/(59-140) 104/62 (07/22 0615) FiO2 (%):  [30 %-40 %] 30 % (07/22 1110) Weight:  [94 kg] 94 kg (07/22 0500)  Physical Exam: Physical Exam Constitutional:      General: He is not in acute distress.    Appearance: He is ill-appearing and diaphoretic.     Comments: Intubated and sedated   Eyes:     General: No scleral icterus.       Right eye: No discharge.        Left eye: No discharge.  Cardiovascular:     Rate and Rhythm: Regular rhythm. Tachycardia present.  Pulmonary:     Comments: Intubated and mechanically ventilated  Abdominal:     General: There is no distension.     Palpations: Abdomen is soft.  Musculoskeletal:        General: No swelling.  Skin:    Coloration: Skin is pale.  Neurological:     Comments: Intubated and sedated      CBC:    BMET Recent Labs    11/30/20 1555 12/01/20 0236  NA 131* 133*  K 3.8 4.1  CL 105 107  CO2 18* 19*  GLUCOSE 143*  139*  BUN 19 22  CREATININE 1.04 0.95  CALCIUM 7.9* 8.0*     Liver Panel  Recent Labs  11/29/20 1310 11/30/20 0833  PROT 7.4 6.3*  ALBUMIN 3.0* 2.3*  AST 36 63*  ALT 56* 70*  ALKPHOS 71 66  BILITOT 1.1 1.3*  BILIDIR  --  0.3*  IBILI  --  1.0*       Sedimentation Rate Recent Labs    11/30/20 0523  ESRSEDRATE 30*   C-Reactive Protein No results for input(s): CRP in the last 72 hours.  Micro Results: Recent Results (from the past 720 hour(s))  SARS CORONAVIRUS 2 (TAT 6-24 HRS) Nasopharyngeal Nasopharyngeal Swab     Status: None   Collection Time: 11/14/20  9:11 AM   Specimen: Nasopharyngeal Swab  Result Value Ref Range Status   SARS Coronavirus 2 NEGATIVE NEGATIVE Final    Comment: (NOTE) SARS-CoV-2 target nucleic acids are NOT DETECTED.  The SARS-CoV-2 RNA is generally detectable in upper and lower respiratory specimens during the acute phase of infection. Negative results do not preclude SARS-CoV-2 infection, do not rule out co-infections with other pathogens, and should not be used as the sole basis for treatment or other patient management decisions. Negative results must be combined with clinical observations, patient history, and epidemiological information. The expected result is Negative.  Fact Sheet for Patients: SugarRoll.be  Fact Sheet for Healthcare Providers: https://www.woods-mathews.com/  This test is not yet approved or cleared by the [place] FDA and  has been authorized for detection and/or diagnosis of SARS-CoV-2 by FDA under an Emergency Use Authorization (EUA). This EUA will remain  in effect (meaning this test can be used) for the duration of the COVID-19 declaration under Se ction 564(b)(1) of the Act, 21 U.S.C. section 360bbb-3(b)(1), unless the authorization is terminated or revoked sooner.  Performed at [facility], [place] [address] [place], [place] [address]   Culture,  blood (routine x 2)     Status: Abnormal (Preliminary result)   Collection Time: 11/29/20 12:54 PM   Specimen: BLOOD  Result Value Ref Range Status   Specimen Description BLOOD SITE NOT SPECIFIED  Final   Special Requests   Final    BOTTLES DRAWN AEROBIC AND ANAEROBIC Blood Culture adequate volume   Culture  Setup Time   Final    GRAM POSITIVE COCCI IN CHAINS IN BOTH AEROBIC AND ANAEROBIC BOTTLES CRITICAL VALUE NOTED.  VALUE IS CONSISTENT WITH PREVIOUSLY REPORTED AND CALLED VALUE. Performed at [facility], [place] [address] [place], [place] [address]    Culture STREPTOCOCCUS GORDONII (A)  Final   Report Status PENDING  Incomplete  Culture, blood (routine x 2)     Status: Abnormal (Preliminary result)   Collection Time: 11/29/20  1:04 PM   Specimen: BLOOD RIGHT HAND  Result Value Ref Range Status   Specimen Description BLOOD RIGHT HAND  Final   Special Requests   Final    BOTTLES DRAWN AEROBIC AND ANAEROBIC Blood Culture results may not be optimal due to an excessive volume of blood received in culture bottles   Culture  Setup Time   Final    GRAM POSITIVE COCCI IN CHAINS IN BOTH AEROBIC AND ANAEROBIC BOTTLES CRITICAL RESULT CALLED TO, READ BACK BY AND VERIFIED WITH: C. AMEND,PHARMD [id number] 11/30/2020 T. TYSOR    Culture (A)  Final    STREPTOCOCCUS GORDONII SUSCEPTIBILITIES TO FOLLOW Performed at [facility], [place] [address], [place], [place] [address]    Report Status PENDING  Incomplete  Blood Culture ID Panel (Reflexed)     Status: Abnormal   Collection Time: 11/29/20  1:04 PM  Result Value Ref Range Status   Enterococcus faecalis NOT DETECTED NOT DETECTED Final   Enterococcus Faecium NOT DETECTED NOT DETECTED Final   Listeria monocytogenes NOT DETECTED NOT DETECTED Final   Staphylococcus species NOT DETECTED NOT DETECTED Final   Staphylococcus aureus (BCID) NOT DETECTED NOT DETECTED Final   Staphylococcus epidermidis NOT DETECTED NOT DETECTED Final    Staphylococcus lugdunensis NOT DETECTED NOT DETECTED Final   Streptococcus species DETECTED (A) NOT DETECTED Final    Comment: Not Enterococcus species, Streptococcus agalactiae, Streptococcus pyogenes, or Streptococcus pneumoniae. CRITICAL RESULT CALLED TO, READ BACK BY AND VERIFIED WITH: C. AMEND,PHARMD [id number] 11/30/2020 T. TYSOR    Streptococcus agalactiae NOT DETECTED NOT DETECTED Final   Streptococcus pneumoniae NOT DETECTED NOT DETECTED Final   Streptococcus pyogenes NOT DETECTED NOT DETECTED Final   A.calcoaceticus-baumannii NOT DETECTED NOT DETECTED Final   Bacteroides fragilis NOT DETECTED NOT DETECTED Final   Enterobacterales NOT DETECTED NOT DETECTED Final   Enterobacter cloacae complex NOT DETECTED NOT DETECTED Final   Escherichia coli NOT DETECTED NOT DETECTED Final   Klebsiella aerogenes NOT DETECTED NOT DETECTED Final   Klebsiella oxytoca NOT DETECTED NOT DETECTED Final   Klebsiella pneumoniae NOT DETECTED NOT DETECTED Final   Proteus species NOT DETECTED NOT DETECTED Final   Salmonella species NOT DETECTED NOT DETECTED Final   Serratia marcescens NOT DETECTED NOT DETECTED Final   Haemophilus influenzae NOT DETECTED NOT DETECTED Final   Neisseria meningitidis NOT DETECTED NOT DETECTED Final   Pseudomonas aeruginosa NOT DETECTED NOT DETECTED Final   Stenotrophomonas maltophilia NOT DETECTED NOT DETECTED Final   Candida albicans NOT DETECTED NOT DETECTED Final   Candida auris NOT DETECTED NOT DETECTED Final   Candida glabrata NOT DETECTED NOT DETECTED Final   Candida krusei NOT DETECTED NOT DETECTED Final   Candida parapsilosis NOT DETECTED NOT DETECTED Final   Candida tropicalis NOT DETECTED NOT DETECTED Final   Cryptococcus neoformans/gattii NOT DETECTED NOT DETECTED Final    Comment: Performed at [facility], [address] [place], [place] [address]  [name] Next Gen by PCR, Nasal     Status: None   Collection Time: 11/29/20  2:01 PM  Result Value Ref Range  Status   [name] by PCR Next Gen NOT DETECTED NOT DETECTED Final    Comment: (NOTE) The GeneXpert [name] Assay (FDA approved for NASAL specimens only), is one component of a comprehensive [name] colonization surveillance program. It is not intended to diagnose [name] infection nor to guide or monitor treatment for [name] infections. Test performance is not FDA approved in patients less than 79 years old. Performed at [facility], [place] [address] [place], [place] [address]   Culture, blood (routine x 2)     Status: None (Preliminary result)   Collection Time: 11/30/20  8:14 AM   Specimen: BLOOD RIGHT HAND  Result Value Ref Range Status   Specimen Description BLOOD RIGHT HAND  Final   Special Requests   Final    BOTTLES DRAWN AEROBIC ONLY Blood Culture adequate volume   Culture   Final    NO GROWTH < 24 HOURS Performed at [facility], [place] [address] [place], [place] [address]    Report Status PENDING  Incomplete  Culture, blood (routine x 2)     Status: None (Preliminary result)   Collection Time: 11/30/20  8:20 AM   Specimen: BLOOD LEFT HAND  Result Value Ref Range Status   Specimen Description BLOOD LEFT HAND  Final   Special Requests   Final    BOTTLES DRAWN AEROBIC ONLY Blood Culture results may not be optimal due to an inadequate volume of blood received in culture bottles   Culture   Final    NO GROWTH < 24 HOURS Performed at [place] [address] [place], [place] [address]    Report Status PENDING  Incomplete    Studies/Results: CT ABDOMEN PELVIS WO CONTRAST  Result Date: 12/01/2020 CLINICAL DATA:  Fever with respiratory failure and abdominal distension. Chest pain or shortness of breath, pleurisy or effusion suspected. Abdominal abscess/infection suspected. EXAM: CT CHEST, ABDOMEN AND PELVIS WITHOUT CONTRAST TECHNIQUE: Multidetector CT imaging of the chest, abdomen and pelvis was performed following the standard protocol without IV contrast. COMPARISON:   Chest radiographs 12/01/2020 and CTs 11/21/2020 FINDINGS: CT CHEST FINDINGS Cardiovascular: Atherosclerosis of the aorta, great vessels and coronary arteries again noted. No acute vascular findings are seen on noncontrast imaging. There are calcifications of the aortic valve. Left subclavian pacemaker appears unchanged. Presumed right-sided deep brain stimulator appears unchanged. Right IJ venous catheter extends to the level of the upper SVC. The heart size is normal. There is no pericardial effusion. Mediastinum/Nodes: There are no enlarged mediastinal, hilar or axillary lymph nodes. Endotracheal and nasogastric tubes are in place. The thyroid gland, trachea and esophagus demonstrate no significant findings. Lungs/Pleura: Since the previous CT, the patient has developed small to moderate dependent pleural effusions and associated bibasilar airspace opacities. Underlying changes of moderate centrilobular emphysema with chronic central airway thickening. Stable 3 mm right upper lobe nodule on image 29/5. Musculoskeletal/Chest wall: No chest wall mass or acute osseous findings. Chronic diffuse thoracic spine ankylosis consistent with ankylosing spondylitis. CT ABDOMEN AND PELVIS FINDINGS Hepatobiliary: No focal hepatic abnormalities are identified on noncontrast imaging. There are multiple small calcified gallstones. No evidence of gallbladder wall thickening or biliary dilatation. Pancreas: Unremarkable. No pancreatic ductal dilatation or surrounding inflammatory changes. Spleen: Normal in size without focal abnormality. Adrenals/Urinary Tract: Both adrenal glands appear normal. No evidence of urinary tract calculus or hydronephrosis. There is no asymmetric perinephric soft tissue stranding. Bladder is decompressed by Foley catheter. A small amount of gas is present in the bladder lumen. Stomach/Bowel: No enteric contrast administered. Nasogastric tube extends into the proximal duodenum. The stomach appears  unremarkable for its degree of distension. No evidence of bowel wall thickening, distention or surrounding inflammatory change. The appendix appears normal. Vascular/Lymphatic: There are no enlarged abdominal or pelvic lymph nodes. Diffuse aortic and branch vessel atherosclerosis. Reproductive: The prostate gland and seminal vesicles appear unremarkable. Other: Trace pelvic and right pericolic gutter ascites without focal extraluminal fluid collection or gas. Musculoskeletal: No acute or significant osseous findings. Previous PLIF at L5-S1. Diffuse ankylosis of the spine and sacroiliac joints consistent with ankylosing spondylitis. IMPRESSION: 1. New bilateral pleural effusions and dependent airspace opacities in both lungs compared with CT of 10 days prior, suspicious for possible aspiration. 2. Trace abdominal ascites without focal fluid collection or other inflammatory changes to suggest intra-abdominal abscess. 3. Support system positioned as above. 4. Cholelithiasis without evidence of cholecystitis or biliary dilatation. 5. Ankylosing spondylitis. 6. Aortic Atherosclerosis (ICD10-I70.0) and Emphysema (ICD10-J43.9). Electronically Signed   By: Richardean Sale M.D.   On: 12/01/2020 11:34   DG Chest 1 View  Result Date: 11/30/2020 CLINICAL DATA:  Central venous catheter placement, orogastric tube placement EXAM: CHEST  1 VIEW COMPARISON:  11/29/2020 FINDINGS: Endotracheal tube is seen 4.9 cm above the carina. Orogastric tube  extends into the upper abdomen beyond the margin of the examination. Right internal jugular central venous catheter tip noted within the superior vena cava. Pulmonary insufflation is stable. Superimposed extensive slightly asymmetric interstitial and airspace infiltrate, more prevalent within the perihilar and lower lung zones bilaterally appears mildly improved, most in keeping with pulmonary edema given its relatively rapid development on 11/29/2020. No pneumothorax or pleural effusion.  Right chest wall neurostimulator battery pack is again noted. Left subclavian single lead pacemaker defibrillator is unchanged. Cardiac size within normal limits. IMPRESSION: Stable endotracheal tube.  Orogastric tube in expected position. Right internal jugular central venous catheter tip within the superior vena cava. No pneumothorax. Stable pulmonary insufflation. Slight interval improvement in extensive slightly asymmetric pulmonary infiltrate, likely edema. Electronically Signed   By: Fidela Salisbury MD   On: 11/30/2020 01:14   CT CHEST WO CONTRAST  Result Date: 12/01/2020 CLINICAL DATA:  Fever with respiratory failure and abdominal distension. Chest pain or shortness of breath, pleurisy or effusion suspected. Abdominal abscess/infection suspected. EXAM: CT CHEST, ABDOMEN AND PELVIS WITHOUT CONTRAST TECHNIQUE: Multidetector CT imaging of the chest, abdomen and pelvis was performed following the standard protocol without IV contrast. COMPARISON:  Chest radiographs 12/01/2020 and CTs 11/21/2020 FINDINGS: CT CHEST FINDINGS Cardiovascular: Atherosclerosis of the aorta, great vessels and coronary arteries again noted. No acute vascular findings are seen on noncontrast imaging. There are calcifications of the aortic valve. Left subclavian pacemaker appears unchanged. Presumed right-sided deep brain stimulator appears unchanged. Right IJ venous catheter extends to the level of the upper SVC. The heart size is normal. There is no pericardial effusion. Mediastinum/Nodes: There are no enlarged mediastinal, hilar or axillary lymph nodes. Endotracheal and nasogastric tubes are in place. The thyroid gland, trachea and esophagus demonstrate no significant findings. Lungs/Pleura: Since the previous CT, the patient has developed small to moderate dependent pleural effusions and associated bibasilar airspace opacities. Underlying changes of moderate centrilobular emphysema with chronic central airway thickening. Stable 3 mm  right upper lobe nodule on image 29/5. Musculoskeletal/Chest wall: No chest wall mass or acute osseous findings. Chronic diffuse thoracic spine ankylosis consistent with ankylosing spondylitis. CT ABDOMEN AND PELVIS FINDINGS Hepatobiliary: No focal hepatic abnormalities are identified on noncontrast imaging. There are multiple small calcified gallstones. No evidence of gallbladder wall thickening or biliary dilatation. Pancreas: Unremarkable. No pancreatic ductal dilatation or surrounding inflammatory changes. Spleen: Normal in size without focal abnormality. Adrenals/Urinary Tract: Both adrenal glands appear normal. No evidence of urinary tract calculus or hydronephrosis. There is no asymmetric perinephric soft tissue stranding. Bladder is decompressed by Foley catheter. A small amount of gas is present in the bladder lumen. Stomach/Bowel: No enteric contrast administered. Nasogastric tube extends into the proximal duodenum. The stomach appears unremarkable for its degree of distension. No evidence of bowel wall thickening, distention or surrounding inflammatory change. The appendix appears normal. Vascular/Lymphatic: There are no enlarged abdominal or pelvic lymph nodes. Diffuse aortic and branch vessel atherosclerosis. Reproductive: The prostate gland and seminal vesicles appear unremarkable. Other: Trace pelvic and right pericolic gutter ascites without focal extraluminal fluid collection or gas. Musculoskeletal: No acute or significant osseous findings. Previous PLIF at L5-S1. Diffuse ankylosis of the spine and sacroiliac joints consistent with ankylosing spondylitis. IMPRESSION: 1. New bilateral pleural effusions and dependent airspace opacities in both lungs compared with CT of 10 days prior, suspicious for possible aspiration. 2. Trace abdominal ascites without focal fluid collection or other inflammatory changes to suggest intra-abdominal abscess. 3. Support system positioned as above. 4. Cholelithiasis  without evidence of cholecystitis or biliary dilatation. 5. Ankylosing spondylitis. 6. Aortic Atherosclerosis (ICD10-I70.0) and Emphysema (ICD10-J43.9). Electronically Signed   By: Richardean Sale M.D.   On: 12/01/2020 11:34   DG CHEST PORT 1 VIEW  Result Date: 12/01/2020 CLINICAL DATA:  Respiratory failure. EXAM: PORTABLE CHEST 1 VIEW COMPARISON:  11/30/2020 FINDINGS: 0514 hours. Endotracheal tube tip is approximately 5.0 cm above the base of the carina. The NG tube passes into the stomach although the distal tip position is not included on the film. The right IJ central line tip overlies the mid SVC level. Persistent diffuse interstitial lung opacity. The cardiopericardial silhouette is within normal limits for size. Left-sided pacer/AICD again noted with right-sided stimulator device evident. Telemetry leads overlie the chest. IMPRESSION: 1. Stable exam. Persistent diffuse interstitial lung disease suggesting edema. 2. Support apparatus as described. Electronically Signed   By: Misty Stanley M.D.   On: 12/01/2020 06:24   DG CHEST PORT 1 VIEW  Result Date: 11/29/2020 CLINICAL DATA:  History of endotracheal tube. EXAM: PORTABLE CHEST 1 VIEW COMPARISON:  Radiograph earlier today.  Chest CT 04/28/2021 FINDINGS: Endotracheal tube tip is at the level of the clavicular heads 5.5 cm from the carina. Left-sided pacemaker in place. Right vagus nerve stimulator in place. Stable heart size and mediastinal contours. Shifting interstitial opacities, now more prominent in the infrahilar regions. No pneumothorax. Possible small right pleural effusion. IMPRESSION: 1. Endotracheal tube tip at the level of the clavicular heads 5.5 cm from the carina. 2. Similar pulmonary edema from earlier today, with slight shifting of distribution. Possible small right pleural effusion. Electronically Signed   By: Keith Rake M.D.   On: 11/29/2020 23:19   DG CHEST PORT 1 VIEW  Result Date: 11/29/2020 CLINICAL DATA:  Cough, short  of breath EXAM: PORTABLE CHEST 1 VIEW COMPARISON:  11/29/2020 FINDINGS: 2 frontal views of the chest demonstrate stable position of the single lead pacer and nerve stimulator. Since the prior exam, diffuse bilateral ground-glass airspace disease has developed most consistent with edema. No effusion or pneumothorax. No acute bony abnormalities. Cardiac silhouette is stable. IMPRESSION: 1. Worsening volume status, with interval development of widespread pulmonary edema. Electronically Signed   By: Randa Ngo M.D.   On: 11/29/2020 22:30   DG CHEST PORT 1 VIEW  Result Date: 11/29/2020 CLINICAL DATA:  Respiratory distress following cardiac catheterization EXAM: PORTABLE CHEST 1 VIEW COMPARISON:  CT from 11/21/2020 FINDINGS: Cardiac shadow is mildly extension weighted by the portable technique. Pacing device is again seen. Stimulator into the right neck is noted. The lungs are well aerated bilaterally. Mild vascular congestion is seen without interstitial edema. No bony abnormality is noted. IMPRESSION: Changes of mild vascular congestion without interstitial edema. Electronically Signed   By: Inez Catalina M.D.   On: 11/29/2020 13:11   DG Abd Portable 1V  Result Date: 11/30/2020 CLINICAL DATA:  Central venous catheter placement, orogastric tube placement EXAM: PORTABLE ABDOMEN - 1 VIEW COMPARISON:  None. FINDINGS: Orogastric tube tip noted at the expected gastric antrum. Visualized abdominal gas pattern is nonobstructive. Cholelithiasis noted within the right upper quadrant. Osseous changes in keeping with ankylosing spondylitis are noted. L4-5 lumbar fusion with instrumentation has been performed. IMPRESSION: Orogastric tube tip within the gastric antrum. Nonobstructive bowel gas pattern. Cholelithiasis. Ankylosing spondylitis.  In Electronically Signed   By: Fidela Salisbury MD   On: 11/30/2020 01:16   CT MAXILLOFACIAL WO CONTRAST  Result Date: 12/01/2020 CLINICAL DATA:  Nasal abscess. EXAM: CT  MAXILLOFACIAL WITHOUT  CONTRAST TECHNIQUE: Multidetector CT imaging of the maxillofacial structures was performed. Multiplanar CT image reconstructions were also generated. COMPARISON:  None. FINDINGS: Osseous: No fracture or mandibular dislocation. No destructive process. Orbits: Negative. No traumatic or inflammatory finding. Sinuses: Clear. Soft tissues: Negative. Limited intracranial: No significant or unexpected finding. IMPRESSION: No definite abnormality seen in maxillofacial region. Electronically Signed   By: Marijo Conception M.D.   On: 12/01/2020 11:26   US Abdomen Limited RUQ (LIVER/GB)  Result Date: 11/30/2020 CLINICAL DATA:  Cholelithiasis. EXAM: ULTRASOUND ABDOMEN LIMITED RIGHT UPPER QUADRANT COMPARISON:  None. FINDINGS: Gallbladder: Cholelithiasis is noted without gallbladder wall thickening or pericholecystic fluid. No sonographic Murphy's sign is noted. Common bile duct: Diameter: 3 mm which is within normal limits. Liver: No focal lesion identified. Within normal limits in parenchymal echogenicity. Portal vein is patent on color Doppler imaging with normal direction of blood flow towards the liver. Other: None. IMPRESSION: Cholelithiasis without cholecystitis. No other abnormality seen in the right upper quadrant of the abdomen. Electronically Signed   By: Marijo Conception M.D.   On: 11/30/2020 11:14      Assessment/Plan:  INTERVAL HISTORY: Patient febrile to 103 this a.m.  His vasopressor requirement has been stable.  His ventilator settings are improving his FiO2 was decreased to 30%.   Principal Problem:   Acute respiratory failure with hypoxia and hypercapnia (HCC) Active Problems:   Cardiogenic shock (HCC)   Ischemic cardiomyopathy   Dyspnea   Elevated brain natriuretic peptide (BNP) level   Elevated troponin   Hypokalemia   Hypomagnesemia   Normocytic anemia   Acute respiratory failure (HCC)   History of ETT   Infected defibrillator [facility])   Streptococcal bacteremia    Central line infection    Brandon [name] is a 65 y.o. male with  a past medical history of coronary artery disease status post PCI x2 most recent 04/2019 with stent to LAD, ischemic cardiomyopathy most recent EF 20% s/p AICD 08/2019, status post [place] 12/2019, moderately severe aortic stenosis who presented with acute hypoxemic respiratory failure status post left and right heart catheterization.  Patient was found to be bacteremic with Streptococcus.  Unclear exact source but patient's AICD wire is likely infected.  Patient was initially on broad-spectrum antibiotics and narrowed to ceftriaxone.  However he was rebroaden to vancomycin and cefepime after spiking a fever to 103 F by PCCM.   Regarding patient's septic shock secondary to Streptococcus bacteremia: Patient's blood cultures on presentation (4 out of 4) grew Streptococcus further speciation still pending.  Repeat cultures 7/21 4 out of 4 NGTD. Patient's vasopressor requirement is stable.  His leukocytosis is downtrending as well as his inflammatory markers.  Patient however spiked a fever to 103 F this AM.  Plan is for patient to still have his AICD explanted and to continue antibiotics. Patient's fever likely related to device infection. Patient was also pan scanned to look for possible infectious source. CT chest was noted to have bilateral small to moderate pleural effusions and dependent airspace opacities in both lungs c/f possible aspiration. His CT Abd pelvis with no signs of abscess. And CT of the maxilla showed no evidence of abscess.  -Recommend narrowing patient to ceftriaxone and consider adding anaerobic coverage with flagyl for possible aspiration pneumonia.  -F/u speciation of blood cultures and sensitivities -Continue with explantation of AICD per cardiology    LOS: 2 days   Brandon [name] 12/01/2020, 11:54 AM

## 2020-12-01 NOTE — Progress Notes (Signed)
Bilateral lower extremity venous duplex completed. Refer to "CV Proc" under chart review to view preliminary results.  12/01/2020 12:36 PM Kelby Aline., MHA, RVT, RDCS, RDMS

## 2020-12-01 NOTE — Consult Note (Addendum)
ELECTROPHYSIOLOGY CONSULT NOTE    Patient ID: Brandon [name] MRN: [id number], DOB/AGE: 1956/04/18 65 y.o.  Admit date: 11/29/2020 Date of Consult: 12/01/2020  Primary Physician: [name], MD Primary Cardiologist: [name], MD  Electrophysiologist: Dr. Rayann Heman Heart Failure: Dr. [name]   Referring Provider: Dr. [name]   Patient Profile: Brandon [name] is a 65 y.o. male with a history of h/o CAD s/p previous MI, HTN, HL, St Jude ICD, S/P Barostim, and chronic systolic heart failure who is being seen today for the evaluation of bacteremia with ICD in place at the request of Dr. [name]  HPI:  [name] is a 65 y.o. male with medical history as above.   Barostim titrated to chronic settings earlier this year.    Work up for TAVR was stopped with ? Fibroelastoma on Cor CT.   Seen recently in CHF clinic and set up for [facility]. Cath showed low filling pressures, stable 2V CAD, severe ICM EF 20%, moderately reduced cardiac output. He returned to the short stay and had been there for ~ 3 hours then developed chills/ nausea/vomiting/dyspnea. Oxygen sats dropped into the low 70s. Monitor showed tachycaria. ST Jude interrogated and this showed Sinus Tach. Temp 102.8 . Given zofran and a total of 750 cc NS.  On further exam pt stated he'd had N/V and intermittent rigors for 6 weeks.   CXR & Blood Cx obtained. Developed acute hypoxic respiratory failure and intubated.   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    TEE 7/21 with EF 20% and further visualization of ? Fibroelastoma on AoV.   EP consulted for planning of device extraction.   He remains intubated and sedated. Stirs to voice. Sister in room.   Past Medical History:  Diagnosis Date   AICD (automatic cardioverter/defibrillator) present 08/31/2019   Ankylosing spondylitis ([place])    Arthritis    BENIGN PROSTATIC HYPERTROPHY 06/07/2008    CHF (congestive heart failure) ([place])    COLONIC POLYPS, HX OF 06/07/2008   Depression    H/O hiatal hernia    Heart failure ([place])    HYPERLIPIDEMIA 06/07/2008   HYPERTENSION 06/07/2008   Myocardial infarction [facility]) 2005   NSTEMI, s/p LAD stent   NEPHROLITHIASIS, HX OF 06/07/2008   STEMI (ST elevation myocardial infarction) ([place]) 04/27/2019     Surgical History:  Past Surgical History:  Procedure Laterality Date   COLONOSCOPY WITH PROPOFOL N/A 04/24/2020   Procedure: COLONOSCOPY WITH PROPOFOL;  Surgeon: [name], MD;  Location: [facility];  Service: Gastroenterology;  Laterality: N/A;   CORONARY ANGIOPLASTY WITH STENT PLACEMENT  2005   LAD stent, jailed diagonal   CORONARY/GRAFT ACUTE MI REVASCULARIZATION N/A 04/27/2019   Procedure: Coronary/Graft Acute MI Revascularization;  Surgeon: [name], MD;  Location: [place] [facility];  Service: Cardiovascular;  Laterality: N/A;   ICD IMPLANT  08/31/2019   ICD IMPLANT N/A 08/31/2019   Procedure: ICD IMPLANT;  Surgeon: [name], MD;  Location: [place] [facility];  Service: Cardiovascular;  Laterality: N/A;   LAMINECTOMY     lumbar   LEFT HEART CATH AND CORONARY ANGIOGRAPHY N/A 04/27/2019   Procedure: LEFT HEART CATH AND CORONARY ANGIOGRAPHY;  Surgeon: [name], MD;  Location: [place] [facility];  Service: Cardiovascular;  Laterality: N/A;   LUMBAR FUSION  01/2012   POLYPECTOMY  04/24/2020   Procedure: POLYPECTOMY;  Surgeon: [name], MD;  Location: [facility];  Service: Gastroenterology;;   RIGHT HEART CATH N/A 04/27/2019   Procedure: RIGHT HEART CATH;  Surgeon: [name], Malek M, MD;  Location: [place] [facility];  Service: Cardiovascular;  Laterality: N/A;   RIGHT/LEFT HEART CATH AND CORONARY ANGIOGRAPHY N/A 11/29/2020   Procedure: RIGHT/LEFT HEART CATH AND CORONARY ANGIOGRAPHY;  Surgeon: Jolaine Artist, MD;  Location: [place] [facility];  Service: Cardiovascular;  Laterality: N/A;    TONSILLECTOMY     warthin tumor removal Left 2014     Medications Prior to Admission  Medication Sig Dispense Refill Last Dose   acetaminophen (TYLENOL) 500 MG tablet Take 1,000 mg by mouth every 6 (six) hours as needed (pain.).   11/29/2020 at 0500   aspirin 81 MG chewable tablet Chew 1 tablet (81 mg total) by mouth daily. (Patient taking differently: Chew 81 mg by mouth in the morning.) 30 tablet 3 11/29/2020 at 0500   atorvastatin (LIPITOR) 80 MG tablet TAKE 1 TABLET BY MOUTH ONCE DAILY AT 6PM. (Patient taking differently: Take 80 mg by mouth daily at 6 PM.) 90 tablet 3 11/28/2020   Carboxymethylcellulose Sodium (ARTIFICIAL TEARS OP) Place 1 drop into both eyes daily as needed (dry/irritated eyes.).   11/29/2020 at 0500   carvedilol (COREG) 6.25 MG tablet TAKE 1 TABLET BY MOUTH TWICE A DAY (Patient taking differently: Take 6.25 mg by mouth in the morning and at bedtime.) 180 tablet 3 11/29/2020 at 0500   citalopram (CELEXA) 10 MG tablet Take 10 mg by mouth 2 (two) times a week.   Past Week   clopidogrel (PLAVIX) 75 MG tablet STOP EFFIENT. WAIT 24 HOURS. TAKE 300 MG OF PLAVIX ONCE. THEN TAKE 75 MG BY MOUTH DAILY. (Patient taking differently: Take 75 mg by mouth in the morning. STOP EFFIENT. WAIT 24 HOURS. TAKE 300 MG OF PLAVIX ONCE. THEN TAKE 75 MG BY MOUTH DAILY.) 90 tablet 3 11/29/2020 at 0500   dapagliflozin propanediol (FARXIGA) 10 MG TABS tablet Take 1 tablet (10 mg total) by mouth daily. (Patient taking differently: Take 10 mg by mouth in the morning.) 90 tablet 3 11/28/2020   diphenhydrAMINE (BENADRYL) 25 MG tablet Take 50 mg by mouth at bedtime.   11/28/2020   ENTRESTO 24-26 MG TAKE 1 TABLET BY MOUTH 2 (TWO) TIMES DAILY. (Patient taking differently: Take 1 tablet by mouth 2 (two) times daily.) 180 tablet 3 11/29/2020 at 0500   eplerenone (INSPRA) 50 MG tablet Take 50 mg by mouth in the morning.   11/28/2020   ezetimibe (ZETIA) 10 MG tablet TAKE ONE TABLET BY MOUTH DAILY (Patient taking  differently: Take 10 mg by mouth in the morning.) 90 tablet 3 11/29/2020 at 0500   furosemide (LASIX) 40 MG tablet Take 1 tablet (40 mg total) by mouth daily. (Patient taking differently: Take 40 mg by mouth in the morning.) 90 tablet 3 11/28/2020   inFLIXimab in sodium chloride 0.9 % Inject into the vein every 6 (six) weeks.   Past Month   tamsulosin (FLOMAX) 0.4 MG CAPS capsule TAKE 1 CAPSULE(0.4 MG) BY MOUTH DAILY (Patient taking differently: Take 0.4 mg by mouth in the morning.) 90 capsule 1 11/29/2020 at 0500   umeclidinium bromide (INCRUSE ELLIPTA) 62.5 MCG/INH AEPB Inhale 1 puff into the lungs daily. (Patient taking differently: Inhale 1 puff into the lungs daily as needed (respiratory issues.).) 30 each 3 Past Month   ivabradine (CORLANOR) 5 MG TABS tablet Take 5 mg 1.5 hours prior to your CT scan appointment time. Bring  the second tablet with you, but do not take unless directed. (Patient not taking: Reported on 11/24/2020) 2 tablet 0 Not Taking    Inpatient Medications:   atorvastatin  80 mg Per Tube Daily   Chlorhexidine Gluconate Cloth  6 each Topical Daily   docusate  100 mg Per Tube BID   enoxaparin (LOVENOX) injection  40 mg Subcutaneous Q24H   feeding supplement (PROSource TF)  45 mL Per Tube BID   insulin aspart  0-15 Units Subcutaneous Q4H   pantoprazole sodium  40 mg Per Tube Daily   polyethylene glycol  17 g Per Tube Daily   sodium chloride flush  3 mL Intravenous Q12H   sodium chloride flush  3 mL Intravenous Q12H    Allergies: No Known Allergies  Social History   Socioeconomic History   Marital status: Single    Spouse name: Not on file   Number of children: Not on file   Years of education: Not on file   Highest education level: Not on file  Occupational History   Occupation: Best boy: [employer]  Tobacco Use   Smoking status: Former    Packs/day: 1.00    Years: 40.00    Pack years: 40.00    Types: Cigarettes    Quit date: 07/26/2019    Years  since quitting: 1.3   Smokeless tobacco: Never   Tobacco comments:    1-1.5 pks per year x 40-45 yrs  Vaping Use   Vaping Use: Never used  Substance and Sexual Activity   Alcohol use: Yes    Alcohol/week: 7.0 standard drinks    Types: 7 Shots of liquor per week    Comment: "2 vodka tonics a night"   Drug use: Yes    Comment: occasionally - CBD oil   Sexual activity: Not on file  Other Topics Concern   Not on file  Social History Narrative   Lives in [place] alone   Works part time for a [employer] in [place] Strain: Low Risk    Difficulty of Paying Living Expenses: Not hard at all  Food Insecurity: No Food Insecurity   Worried About Charity fundraiser in the Last Year: Never true   Arboriculturist in the Last Year: Never true  Transportation Needs: No Transportation Needs   Lack of Transportation (Medical): No   Lack of Transportation (Non-Medical): No  Physical Activity: Sufficiently Active   Days of Exercise per Week: 5 days   Minutes of Exercise per Session: 40 min  Stress: No Stress Concern Present   Feeling of Stress : Not at all  Social Connections: Socially Isolated   Frequency of Communication with Friends and Family: More than three times a week   Frequency of Social Gatherings with Friends and Family: More than three times a week   Attends Religious Services: Never   Marine scientist or Organizations: No   Attends Music therapist: Never   Marital Status: Never married  Human resources officer Violence: Not At Risk   Fear of Current or Ex-Partner: No   Emotionally Abused: No   Physically Abused: No   Sexually Abused: No     Family History  Problem Relation Age of Onset   Depression Father    Arthritis Sister        RA   Heart failure Mother    Other Neg Hx  Colon cancer Neg Hx    Esophageal cancer Neg Hx    Rectal cancer Neg Hx    Stomach cancer Neg Hx     Colon polyps Neg Hx      ROS: Patient is encephalopathic and/or intubated. History has been obtained from chart review.    Physical Exam: Vitals:   12/01/20 0732 12/01/20 0740 12/01/20 0930 12/01/20 1110  BP:    106/62  Pulse:    98  Resp:      Temp:  (!) 103.3 F (39.6 C) (!) 100.6 F (38.1 C)   TempSrc:  Oral    SpO2: 99%   100%  Weight:      Height:        GEN- The patient is intubated and sedated. Responds to voice HEENT- + ET tube.  Lungs- +mechanical breathing sounds.  Heart- Tachy rate and irregular rhythm due to ectopy, no murmurs, rubs or gallops GI- soft Extremities- no clubbing or cyanosis. + edema Skin- no rash or lesion Neuro- intubated and sedated.    Labs:   Lab Results  Component Value Date   WBC 14.7 (H) 12/01/2020   HGB 9.7 (L) 12/01/2020   HCT 29.2 (L) 12/01/2020   MCV 86.6 12/01/2020   PLT 213 12/01/2020    Recent Labs  Lab 11/30/20 0833 11/30/20 0839 12/01/20 0236  NA  --    < > 133*  K  --    < > 4.1  CL  --    < > 107  CO2  --    < > 19*  BUN  --    < > 22  CREATININE  --    < > 0.95  CALCIUM  --    < > 8.0*  PROT 6.3*  --   --   BILITOT 1.3*  --   --   ALKPHOS 66  --   --   ALT 70*  --   --   AST 63*  --   --   GLUCOSE  --    < > 139*   < > = values in this interval not displayed.      Radiology/Studies: CT ABDOMEN PELVIS WO CONTRAST  Result Date: 12/01/2020 CLINICAL DATA:  Fever with respiratory failure and abdominal distension. Chest pain or shortness of breath, pleurisy or effusion suspected. Abdominal abscess/infection suspected. EXAM: CT CHEST, ABDOMEN AND PELVIS WITHOUT CONTRAST TECHNIQUE: Multidetector CT imaging of the chest, abdomen and pelvis was performed following the standard protocol without IV contrast. COMPARISON:  Chest radiographs 12/01/2020 and CTs 11/21/2020 FINDINGS: CT CHEST FINDINGS Cardiovascular: Atherosclerosis of the aorta, great vessels and coronary arteries again noted. No acute vascular findings are  seen on noncontrast imaging. There are calcifications of the aortic valve. Left subclavian pacemaker appears unchanged. Presumed right-sided deep brain stimulator appears unchanged. Right IJ venous catheter extends to the level of the upper SVC. The heart size is normal. There is no pericardial effusion. Mediastinum/Nodes: There are no enlarged mediastinal, hilar or axillary lymph nodes. Endotracheal and nasogastric tubes are in place. The thyroid gland, trachea and esophagus demonstrate no significant findings. Lungs/Pleura: Since the previous CT, the patient has developed small to moderate dependent pleural effusions and associated bibasilar airspace opacities. Underlying changes of moderate centrilobular emphysema with chronic central airway thickening. Stable 3 mm right upper lobe nodule on image 29/5. Musculoskeletal/Chest wall: No chest wall mass or acute osseous findings. Chronic diffuse thoracic spine ankylosis consistent with ankylosing spondylitis. CT ABDOMEN AND PELVIS FINDINGS  Hepatobiliary: No focal hepatic abnormalities are identified on noncontrast imaging. There are multiple small calcified gallstones. No evidence of gallbladder wall thickening or biliary dilatation. Pancreas: Unremarkable. No pancreatic ductal dilatation or surrounding inflammatory changes. Spleen: Normal in size without focal abnormality. Adrenals/Urinary Tract: Both adrenal glands appear normal. No evidence of urinary tract calculus or hydronephrosis. There is no asymmetric perinephric soft tissue stranding. Bladder is decompressed by Foley catheter. A small amount of gas is present in the bladder lumen. Stomach/Bowel: No enteric contrast administered. Nasogastric tube extends into the proximal duodenum. The stomach appears unremarkable for its degree of distension. No evidence of bowel wall thickening, distention or surrounding inflammatory change. The appendix appears normal. Vascular/Lymphatic: There are no enlarged abdominal  or pelvic lymph nodes. Diffuse aortic and branch vessel atherosclerosis. Reproductive: The prostate gland and seminal vesicles appear unremarkable. Other: Trace pelvic and right pericolic gutter ascites without focal extraluminal fluid collection or gas. Musculoskeletal: No acute or significant osseous findings. Previous PLIF at L5-S1. Diffuse ankylosis of the spine and sacroiliac joints consistent with ankylosing spondylitis. IMPRESSION: 1. New bilateral pleural effusions and dependent airspace opacities in both lungs compared with CT of 10 days prior, suspicious for possible aspiration. 2. Trace abdominal ascites without focal fluid collection or other inflammatory changes to suggest intra-abdominal abscess. 3. Support system positioned as above. 4. Cholelithiasis without evidence of cholecystitis or biliary dilatation. 5. Ankylosing spondylitis. 6. Aortic Atherosclerosis (ICD10-I70.0) and Emphysema (ICD10-J43.9). Electronically Signed   By: Richardean Sale M.D.   On: 12/01/2020 11:34   DG Chest 1 View  Result Date: 11/30/2020 CLINICAL DATA:  Central venous catheter placement, orogastric tube placement EXAM: CHEST  1 VIEW COMPARISON:  11/29/2020 FINDINGS: Endotracheal tube is seen 4.9 cm above the carina. Orogastric tube extends into the upper abdomen beyond the margin of the examination. Right internal jugular central venous catheter tip noted within the superior vena cava. Pulmonary insufflation is stable. Superimposed extensive slightly asymmetric interstitial and airspace infiltrate, more prevalent within the perihilar and lower lung zones bilaterally appears mildly improved, most in keeping with pulmonary edema given its relatively rapid development on 11/29/2020. No pneumothorax or pleural effusion. Right chest wall neurostimulator battery pack is again noted. Left subclavian single lead pacemaker defibrillator is unchanged. Cardiac size within normal limits. IMPRESSION: Stable endotracheal tube.   Orogastric tube in expected position. Right internal jugular central venous catheter tip within the superior vena cava. No pneumothorax. Stable pulmonary insufflation. Slight interval improvement in extensive slightly asymmetric pulmonary infiltrate, likely edema. Electronically Signed   By: Fidela Salisbury MD   On: 11/30/2020 01:14   CT CHEST WO CONTRAST  Result Date: 12/01/2020 CLINICAL DATA:  Fever with respiratory failure and abdominal distension. Chest pain or shortness of breath, pleurisy or effusion suspected. Abdominal abscess/infection suspected. EXAM: CT CHEST, ABDOMEN AND PELVIS WITHOUT CONTRAST TECHNIQUE: Multidetector CT imaging of the chest, abdomen and pelvis was performed following the standard protocol without IV contrast. COMPARISON:  Chest radiographs 12/01/2020 and CTs 11/21/2020 FINDINGS: CT CHEST FINDINGS Cardiovascular: Atherosclerosis of the aorta, great vessels and coronary arteries again noted. No acute vascular findings are seen on noncontrast imaging. There are calcifications of the aortic valve. Left subclavian pacemaker appears unchanged. Presumed right-sided deep brain stimulator appears unchanged. Right IJ venous catheter extends to the level of the upper SVC. The heart size is normal. There is no pericardial effusion. Mediastinum/Nodes: There are no enlarged mediastinal, hilar or axillary lymph nodes. Endotracheal and nasogastric tubes are in place. The thyroid gland, trachea and  esophagus demonstrate no significant findings. Lungs/Pleura: Since the previous CT, the patient has developed small to moderate dependent pleural effusions and associated bibasilar airspace opacities. Underlying changes of moderate centrilobular emphysema with chronic central airway thickening. Stable 3 mm right upper lobe nodule on image 29/5. Musculoskeletal/Chest wall: No chest wall mass or acute osseous findings. Chronic diffuse thoracic spine ankylosis consistent with ankylosing spondylitis. CT  ABDOMEN AND PELVIS FINDINGS Hepatobiliary: No focal hepatic abnormalities are identified on noncontrast imaging. There are multiple small calcified gallstones. No evidence of gallbladder wall thickening or biliary dilatation. Pancreas: Unremarkable. No pancreatic ductal dilatation or surrounding inflammatory changes. Spleen: Normal in size without focal abnormality. Adrenals/Urinary Tract: Both adrenal glands appear normal. No evidence of urinary tract calculus or hydronephrosis. There is no asymmetric perinephric soft tissue stranding. Bladder is decompressed by Foley catheter. A small amount of gas is present in the bladder lumen. Stomach/Bowel: No enteric contrast administered. Nasogastric tube extends into the proximal duodenum. The stomach appears unremarkable for its degree of distension. No evidence of bowel wall thickening, distention or surrounding inflammatory change. The appendix appears normal. Vascular/Lymphatic: There are no enlarged abdominal or pelvic lymph nodes. Diffuse aortic and branch vessel atherosclerosis. Reproductive: The prostate gland and seminal vesicles appear unremarkable. Other: Trace pelvic and right pericolic gutter ascites without focal extraluminal fluid collection or gas. Musculoskeletal: No acute or significant osseous findings. Previous PLIF at L5-S1. Diffuse ankylosis of the spine and sacroiliac joints consistent with ankylosing spondylitis. IMPRESSION: 1. New bilateral pleural effusions and dependent airspace opacities in both lungs compared with CT of 10 days prior, suspicious for possible aspiration. 2. Trace abdominal ascites without focal fluid collection or other inflammatory changes to suggest intra-abdominal abscess. 3. Support system positioned as above. 4. Cholelithiasis without evidence of cholecystitis or biliary dilatation. 5. Ankylosing spondylitis. 6. Aortic Atherosclerosis (ICD10-I70.0) and Emphysema (ICD10-J43.9). Electronically Signed   By: Richardean Sale  M.D.   On: 12/01/2020 11:34   CARDIAC CATHETERIZATION  Result Date: 11/29/2020 Formatting of this result is different from the original.   Prox Cx lesion is 50% stenosed.   Prox RCA to Mid RCA lesion is 100% stenosed.   Ost RCA to Prox RCA lesion is 50% stenosed.   Non-stenotic Mid LAD lesion was previously treated. Findings: Ao = 81/50 (63) LV = 99/12 RA = 4 RV = 24/6 PA = 19/5 (14) PCW = 3 Fick cardiac output/index = 4.7/2.3 PVR = 2.3 WU Ao sat = 99% PA sat = 69%, 70% Assessment: Stable 2v CAD Severe iCM EF 20% Low filling pressures with moderately reduced cardiac output Heavily calcified aortic valve (difficult to cross). Appears to be moderate by pressures but may have component of low-gradient, low-flow AS Plan/Discussion: Continue VAD w/u. Glori Bickers, MD 10:29 AM  CT CORONARY MORPH W/CTA COR W/SCORE Lewanda Rife W/CM &/OR WO/CM  Addendum Date: 11/22/2020   ADDENDUM REPORT: 11/22/2020 12:57 CLINICAL DATA:  Severe Aortic Stenosis. EXAM: Cardiac TAVR CT TECHNIQUE: A non-contrast, gated CT scan was obtained with axial slices of 3 mm through the heart for aortic valve calcium scoring. A 120 kV retrospective, gated, contrast cardiac scan was obtained. Gantry rotation speed was 250 msecs and collimation was 0.6 mm. Nitroglycerin was not given. The 3D data set was reconstructed in 5% intervals of the 0-95% of the R-R cycle. Systolic and diastolic phases were analyzed on a dedicated workstation using MPR, MIP, and VRT modes. The patient received 100 cc of contrast. FINDINGS: Image quality: Excellent. Noise artifact is: Limited. Valve Morphology:  The aortic valve is tricuspid and diffusely calcified with severe calcifications. No bulky calcification. The leaflets are restricted in systole. There is a linear, strand-like mass attached to the [place] of the aortic valve. The mass measures 16.2 mm in length and has a circular rounded tip up to 4.6 mm. The strand appears to be a fibrous stalk (HU 150-250), and is not a  thrombus. 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 There is no significant calcification to suggest malignancy, the structure is void of calcification. There is no destruction of the valve to suggest endocarditis. This is a contraindication to TAVR. Aortic Valve Calcium score: 1430 Aortic annular dimension: Phase assessed: 15% Annular area: 578 mm 2 Annular perimeter: 87.1 mm Max diameter: 31.1 mm Min diameter: 23.7 mm Annular and subannular calcification: None. Optimal coplanar projection: RAO 6 CAU 28 Coronary Artery Height above Annulus: Left Main: 15.4 mm Right Coronary: 20.8 mm Sinus of Valsalva Measurements: Non-coronary: 37 mm Right-coronary: 35 mm Left-coronary: 38 mm Maximum (sinus to sinus): 40 mm Sinus of Valsalva Height: Non-coronary: 31.8 mm Right-coronary: 28.0 mm Left-coronary: 25.1 mm Sinotubular Junction: Ascending Thoracic Aorta: 38 mm Coronary Arteries: Normal coronary origin. Right dominance. The study was performed without use of NTG and is insufficient for plaque evaluation. Please refer to recent cardiac catheterization for coronary assessment. Cardiac Morphology: Right Atrium: Right atrial size is dilated. Right Ventricle: The right ventricular cavity is dilated. A single lead pacer wire is seen that terminates in the RV apex. Left Atrium: Left atrial size is dilated with no left atrial appendage filling defect. A PFO is present. Left Ventricle: The ventricular cavity size is severely dilated. There is fibrofatty replacement in the septum into apex consistent with prior LAD infarction. There is no abnormal filling defect. There is severely reduced left ventricular function, EF 29%. There is akinesis of the mid septum, apical septum, apex, and apical inferior segment consistent with prior wrap around LAD infarction. All other segments are hypokinetic. Pulmonary arteries: Dilated consistent with pulmonary hypertension, no proximal filling defect. Pulmonary veins: Normal pulmonary venous  drainage. Pericardium: Normal thickness with trivial effusion. Mitral Valve: The mitral valve is normal structure with mild annular calcification. Extra-cardiac findings: See attached radiology report for non-cardiac structures. IMPRESSION: 1. There is a mass (16.2 mm x 4.6 mm) attached to the [place] of the aortic valve consistent with a papillary fibroelastoma. Would recommend against TAVR due to this finding. 2. Annular measurements are appropriate for 29 mm S3 but PFE precludes this. 3. Severely dilated LV cavity with severely reduced function, LVEF 29%. RWMA consistent with prior LAD infarction. No LV thrombus. 4. PFO present. [place] T. Audie Box, MD Electronically Signed   By: Eleonore Chiquito   On: 11/22/2020 12:57   Result Date: 11/22/2020 EXAM: OVER-READ INTERPRETATION  CT CHEST The following report is an over-read performed by radiologist Dr. Vinnie Langton of [facility] [facility], [place] on 11/21/2020. This over-read does not include interpretation of cardiac or coronary anatomy or pathology. The coronary calcium score/coronary CTA interpretation by the cardiologist is attached. COMPARISON:  Chest CTA 07/31/2020. FINDINGS: Extracardiac findings will be described separately under dictation for contemporaneously obtained CTA chest, abdomen and pelvis. IMPRESSION: Please see separate dictation for contemporaneously obtained CTA chest, abdomen and pelvis dated 11/21/2020 for full description of relevant extracardiac findings. Electronically Signed: By: Vinnie Langton M.D. On: 11/21/2020 12:07   DG CHEST PORT 1 VIEW  Result Date: 12/01/2020 CLINICAL DATA:  Respiratory failure. EXAM: PORTABLE CHEST 1 VIEW COMPARISON:  11/30/2020 FINDINGS: [phone]  hours. Endotracheal tube tip is approximately 5.0 cm above the base of the carina. The NG tube passes into the stomach although the distal tip position is not included on the film. The right IJ central line tip overlies the mid SVC level. Persistent diffuse interstitial lung  opacity. The cardiopericardial silhouette is within normal limits for size. Left-sided pacer/AICD again noted with right-sided stimulator device evident. Telemetry leads overlie the chest. IMPRESSION: 1. Stable exam. Persistent diffuse interstitial lung disease suggesting edema. 2. Support apparatus as described. Electronically Signed   By: Misty Stanley M.D.   On: 12/01/2020 06:24   DG CHEST PORT 1 VIEW  Result Date: 11/29/2020 CLINICAL DATA:  History of endotracheal tube. EXAM: PORTABLE CHEST 1 VIEW COMPARISON:  Radiograph earlier today.  Chest CT 04/28/2021 FINDINGS: Endotracheal tube tip is at the level of the clavicular heads 5.5 cm from the carina. Left-sided pacemaker in place. Right vagus nerve stimulator in place. Stable heart size and mediastinal contours. Shifting interstitial opacities, now more prominent in the infrahilar regions. No pneumothorax. Possible small right pleural effusion. IMPRESSION: 1. Endotracheal tube tip at the level of the clavicular heads 5.5 cm from the carina. 2. Similar pulmonary edema from earlier today, with slight shifting of distribution. Possible small right pleural effusion. Electronically Signed   By: Keith Rake M.D.   On: 11/29/2020 23:19   DG CHEST PORT 1 VIEW  Result Date: 11/29/2020 CLINICAL DATA:  Cough, short of breath EXAM: PORTABLE CHEST 1 VIEW COMPARISON:  11/29/2020 FINDINGS: 2 frontal views of the chest demonstrate stable position of the single lead pacer and nerve stimulator. Since the prior exam, diffuse bilateral ground-glass airspace disease has developed most consistent with edema. No effusion or pneumothorax. No acute bony abnormalities. Cardiac silhouette is stable. IMPRESSION: 1. Worsening volume status, with interval development of widespread pulmonary edema. Electronically Signed   By: Randa Ngo M.D.   On: 11/29/2020 22:30   DG CHEST PORT 1 VIEW  Result Date: 11/29/2020 CLINICAL DATA:  Respiratory distress following cardiac  catheterization EXAM: PORTABLE CHEST 1 VIEW COMPARISON:  CT from 11/21/2020 FINDINGS: Cardiac shadow is mildly extension weighted by the portable technique. Pacing device is again seen. Stimulator into the right neck is noted. The lungs are well aerated bilaterally. Mild vascular congestion is seen without interstitial edema. No bony abnormality is noted. IMPRESSION: Changes of mild vascular congestion without interstitial edema. Electronically Signed   By: Inez Catalina M.D.   On: 11/29/2020 13:11   DG Abd Portable 1V  Result Date: 11/30/2020 CLINICAL DATA:  Central venous catheter placement, orogastric tube placement EXAM: PORTABLE ABDOMEN - 1 VIEW COMPARISON:  None. FINDINGS: Orogastric tube tip noted at the expected gastric antrum. Visualized abdominal gas pattern is nonobstructive. Cholelithiasis noted within the right upper quadrant. Osseous changes in keeping with ankylosing spondylitis are noted. L4-5 lumbar fusion with instrumentation has been performed. IMPRESSION: Orogastric tube tip within the gastric antrum. Nonobstructive bowel gas pattern. Cholelithiasis. Ankylosing spondylitis.  In Electronically Signed   By: Fidela Salisbury MD   On: 11/30/2020 01:16   CT ANGIO CHEST AORTA W/CM & OR WO/CM  Result Date: 11/21/2020 CLINICAL DATA:  65 year old male with history of severe aortic stenosis. Preprocedural study prior to potential transcatheter aortic valve replacement (TAVR) procedure. EXAM: CT ANGIOGRAPHY CHEST, ABDOMEN AND PELVIS TECHNIQUE: Multidetector CT imaging through the chest, abdomen and pelvis was performed using the standard protocol during bolus administration of intravenous contrast. Multiplanar reconstructed images and MIPs were obtained and reviewed to evaluate  the vascular anatomy. CONTRAST:  170m OMNIPAQUE IOHEXOL 350 MG/ML SOLN COMPARISON:  Chest CTA 07/31/2020. FINDINGS: CTA CHEST FINDINGS Cardiovascular: Heart size is normal. There is no significant pericardial fluid, thickening  or pericardial calcification. There is aortic atherosclerosis, as well as atherosclerosis of the great vessels of the mediastinum and the coronary arteries, including calcified atherosclerotic plaque in the left main, left anterior descending, left circumflex and right coronary arteries. Myocardial thinning and hypoattenuation throughout the left ventricular wall in the mid to distal LAD territory, including rounding and mild aneurysmal dilatation of the left ventricular apex, compatible with fibrofatty remodeling and metaplasia in the setting of prior myocardial infarctions. Severe thickening calcification of the aortic valve. Left-sided pacemaker device in place with lead tip terminating along the free wall of the right ventricle near the apex. Electronic device in the right upper chest with leads extending cephalad, presumably a deep brain stimulator. Mediastinum/Lymph Nodes: No pathologically enlarged mediastinal or hilar lymph nodes. Esophagus is unremarkable in appearance. No axillary lymphadenopathy. Lungs/Pleura: No suspicious appearing pulmonary nodules or masses are noted. No acute consolidative airspace disease. No pleural effusions. Mild diffuse bronchial wall thickening with mild centrilobular and paraseptal emphysema. Musculoskeletal/Soft Tissues: There are no aggressive appearing lytic or blastic lesions noted in the visualized portions of the skeleton. CTA ABDOMEN AND PELVIS FINDINGS Hepatobiliary: No suspicious cystic or solid hepatic lesions. Diffuse low attenuation throughout the hepatic parenchyma, suggestive of hepatic steatosis (difficult to say for certain on today's contrast enhanced examination). Several tiny calcified gallstones lie depending in the gallbladder. No findings to suggest an acute cholecystitis at this time. Pancreas: No pancreatic mass. No pancreatic ductal dilatation. No pancreatic or peripancreatic fluid collections or inflammatory changes. Spleen: Unremarkable.  Adrenals/Urinary Tract: Bilateral kidneys and bilateral adrenal glands are normal in appearance. No hydroureteronephrosis. Urinary bladder is unremarkable in appearance. Stomach/Bowel: The appearance of the stomach is normal. No pathologic dilatation of small bowel or colon. Small bowel malrotation (normal anatomical variant) incidentally noted. A few scattered colonic diverticulae are noted, without surrounding inflammatory changes to suggest an acute diverticulitis at this time. Normal appendix. Vascular/Lymphatic: Aortic atherosclerosis, without evidence of aneurysm or dissection in the abdominal or pelvic vasculature. Vascular findings and measurements pertinent to potential TAVR procedure, as detailed below. Reproductive: Prostate gland and seminal vesicles are unremarkable in appearance. Other: No significant volume of ascites.  No pneumoperitoneum. Musculoskeletal: Status post PLIF at L5-S1 with interbody cages at the L5-S1 interspace. There are no aggressive appearing lytic or blastic lesions noted in the visualized portions of the skeleton. VASCULAR MEASUREMENTS PERTINENT TO TAVR: AORTA: Minimal Aortic Diameter-13 x 13 mm Severity of Aortic Calcification-severe RIGHT PELVIS: Right Common Iliac Artery - Minimal Diameter-9.4 x 5.0 mm Tortuosity-mild Calcification-severe Right External Iliac Artery - Minimal Diameter-7.6 x 5.6 mm Tortuosity-mild Calcification-moderate to severe Right Common Femoral Artery - Minimal Diameter-10.4 x 3.7 mm Tortuosity-mild Calcification-severe LEFT PELVIS: Left Common Iliac Artery - Minimal Diameter-9.0 x 6.9 mm Tortuosity-mild Calcification-severe Left External Iliac Artery - Minimal Diameter-6.1 x 5.3 mm Tortuosity-mild-to-moderate Calcification-moderate to severe Left Common Femoral Artery - Minimal Diameter-10.7 x 5.7 mm Tortuosity-mild Calcification-severe Review of the MIP images confirms the above findings. IMPRESSION: 1. Vascular findings and measurements pertinent to  potential TAVR procedure, as detailed above. 2. Severe thickening calcification of the aortic valve, compatible with reported clinical history of severe aortic stenosis. 3. Aortic atherosclerosis, in addition to left main and 3 vessel coronary artery disease. Evidence of prior LAD territory myocardial infarction(s), as above. 4. Cholelithiasis without evidence of  acute cholecystitis at this time. 5. Small bowel malrotation (normal anatomical variant) incidentally noted. This is of doubtful clinical significance in a patient this age. 6. Mild diffuse bronchial wall thickening with mild centrilobular and paraseptal emphysema; imaging findings suggestive of underlying COPD. Electronically Signed   By: Vinnie Langton M.D.   On: 11/21/2020 12:36   CUP PACEART REMOTE DEVICE CHECK  Result Date: 11/29/2020 Scheduled remote reviewed. Normal device function.  Corvue stable. Next remote 91 days. LR  CT MAXILLOFACIAL WO CONTRAST  Result Date: 12/01/2020 CLINICAL DATA:  Nasal abscess. EXAM: CT MAXILLOFACIAL WITHOUT CONTRAST TECHNIQUE: Multidetector CT imaging of the maxillofacial structures was performed. Multiplanar CT image reconstructions were also generated. COMPARISON:  None. FINDINGS: Osseous: No fracture or mandibular dislocation. No destructive process. Orbits: Negative. No traumatic or inflammatory finding. Sinuses: Clear. Soft tissues: Negative. Limited intracranial: No significant or unexpected finding. IMPRESSION: No definite abnormality seen in maxillofacial region. Electronically Signed   By: Marijo Conception M.D.   On: 12/01/2020 11:26   CT ANGIO ABDOMEN PELVIS  W &/OR WO CONTRAST  Result Date: 11/21/2020 CLINICAL DATA:  65 year old male with history of severe aortic stenosis. Preprocedural study prior to potential transcatheter aortic valve replacement (TAVR) procedure. EXAM: CT ANGIOGRAPHY CHEST, ABDOMEN AND PELVIS TECHNIQUE: Multidetector CT imaging through the chest, abdomen and pelvis was  performed using the standard protocol during bolus administration of intravenous contrast. Multiplanar reconstructed images and MIPs were obtained and reviewed to evaluate the vascular anatomy. CONTRAST:  124m OMNIPAQUE IOHEXOL 350 MG/ML SOLN COMPARISON:  Chest CTA 07/31/2020. FINDINGS: CTA CHEST FINDINGS Cardiovascular: Heart size is normal. There is no significant pericardial fluid, thickening or pericardial calcification. There is aortic atherosclerosis, as well as atherosclerosis of the great vessels of the mediastinum and the coronary arteries, including calcified atherosclerotic plaque in the left main, left anterior descending, left circumflex and right coronary arteries. Myocardial thinning and hypoattenuation throughout the left ventricular wall in the mid to distal LAD territory, including rounding and mild aneurysmal dilatation of the left ventricular apex, compatible with fibrofatty remodeling and metaplasia in the setting of prior myocardial infarctions. Severe thickening calcification of the aortic valve. Left-sided pacemaker device in place with lead tip terminating along the free wall of the right ventricle near the apex. Electronic device in the right upper chest with leads extending cephalad, presumably a deep brain stimulator. Mediastinum/Lymph Nodes: No pathologically enlarged mediastinal or hilar lymph nodes. Esophagus is unremarkable in appearance. No axillary lymphadenopathy. Lungs/Pleura: No suspicious appearing pulmonary nodules or masses are noted. No acute consolidative airspace disease. No pleural effusions. Mild diffuse bronchial wall thickening with mild centrilobular and paraseptal emphysema. Musculoskeletal/Soft Tissues: There are no aggressive appearing lytic or blastic lesions noted in the visualized portions of the skeleton. CTA ABDOMEN AND PELVIS FINDINGS Hepatobiliary: No suspicious cystic or solid hepatic lesions. Diffuse low attenuation throughout the hepatic parenchyma,  suggestive of hepatic steatosis (difficult to say for certain on today's contrast enhanced examination). Several tiny calcified gallstones lie depending in the gallbladder. No findings to suggest an acute cholecystitis at this time. Pancreas: No pancreatic mass. No pancreatic ductal dilatation. No pancreatic or peripancreatic fluid collections or inflammatory changes. Spleen: Unremarkable. Adrenals/Urinary Tract: Bilateral kidneys and bilateral adrenal glands are normal in appearance. No hydroureteronephrosis. Urinary bladder is unremarkable in appearance. Stomach/Bowel: The appearance of the stomach is normal. No pathologic dilatation of small bowel or colon. Small bowel malrotation (normal anatomical variant) incidentally noted. A few scattered colonic diverticulae are noted, without surrounding inflammatory changes to  suggest an acute diverticulitis at this time. Normal appendix. Vascular/Lymphatic: Aortic atherosclerosis, without evidence of aneurysm or dissection in the abdominal or pelvic vasculature. Vascular findings and measurements pertinent to potential TAVR procedure, as detailed below. Reproductive: Prostate gland and seminal vesicles are unremarkable in appearance. Other: No significant volume of ascites.  No pneumoperitoneum. Musculoskeletal: Status post PLIF at L5-S1 with interbody cages at the L5-S1 interspace. There are no aggressive appearing lytic or blastic lesions noted in the visualized portions of the skeleton. VASCULAR MEASUREMENTS PERTINENT TO TAVR: AORTA: Minimal Aortic Diameter-13 x 13 mm Severity of Aortic Calcification-severe RIGHT PELVIS: Right Common Iliac Artery - Minimal Diameter-9.4 x 5.0 mm Tortuosity-mild Calcification-severe Right External Iliac Artery - Minimal Diameter-7.6 x 5.6 mm Tortuosity-mild Calcification-moderate to severe Right Common Femoral Artery - Minimal Diameter-10.4 x 3.7 mm Tortuosity-mild Calcification-severe LEFT PELVIS: Left Common Iliac Artery - Minimal  Diameter-9.0 x 6.9 mm Tortuosity-mild Calcification-severe Left External Iliac Artery - Minimal Diameter-6.1 x 5.3 mm Tortuosity-mild-to-moderate Calcification-moderate to severe Left Common Femoral Artery - Minimal Diameter-10.7 x 5.7 mm Tortuosity-mild Calcification-severe Review of the MIP images confirms the above findings. IMPRESSION: 1. Vascular findings and measurements pertinent to potential TAVR procedure, as detailed above. 2. Severe thickening calcification of the aortic valve, compatible with reported clinical history of severe aortic stenosis. 3. Aortic atherosclerosis, in addition to left main and 3 vessel coronary artery disease. Evidence of prior LAD territory myocardial infarction(s), as above. 4. Cholelithiasis without evidence of acute cholecystitis at this time. 5. Small bowel malrotation (normal anatomical variant) incidentally noted. This is of doubtful clinical significance in a patient this age. 6. Mild diffuse bronchial wall thickening with mild centrilobular and paraseptal emphysema; imaging findings suggestive of underlying COPD. Electronically Signed   By: Vinnie Langton M.D.   On: 11/21/2020 12:36   US Abdomen Limited RUQ (LIVER/GB)  Result Date: 11/30/2020 CLINICAL DATA:  Cholelithiasis. EXAM: ULTRASOUND ABDOMEN LIMITED RIGHT UPPER QUADRANT COMPARISON:  None. FINDINGS: Gallbladder: Cholelithiasis is noted without gallbladder wall thickening or pericholecystic fluid. No sonographic Murphy's sign is noted. Common bile duct: Diameter: 3 mm which is within normal limits. Liver: No focal lesion identified. Within normal limits in parenchymal echogenicity. Portal vein is patent on color Doppler imaging with normal direction of blood flow towards the liver. Other: None. IMPRESSION: Cholelithiasis without cholecystitis. No other abnormality seen in the right upper quadrant of the abdomen. Electronically Signed   By: Marijo Conception M.D.   On: 11/30/2020 11:14    EKG: 7/21 shows "NSR 94  bpm" Baseline prohibits read (Barostim device) (personally reviewed)  TELEMETRY: NSR/sinus tach with frequent PACs up to bigeminal and occasional PVCs (personally reviewed)  DEVICE HISTORY:  Barostim (standard) implanted 12/31/2019 for Chronic systolic CHF St. Jude Single Chamber ICD implanted 08/2019 for chronic systolic CHF/primary prevention History of appropriate therapy: No History of AAD therapy: No   Assessment/Plan: 1.  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 Broadening given spike in temp Will plan ICD extraction at next available time, possible as early as Monday.     2. Acute Hypoxic Respiratory Failure Remains intubated and sedated.  Vent management per PCCM   3. Chronic Systolic Heart Failure, ICM Echo 12/20 EF post anterior MI 30 to 35%. Echo 6/21 EF 25-30% RV ok Echo 05/19/20 EF 20-25% RV mildly HK. moderate AS  Mean gradient  13 AVA 1.2 cm2 DI 0.30 s/p ICD and has Barostim as above.  Had [place] this admit by Dr. [name] with low filling pressures. CI marginal at 2.3 GDMT on hold in setting of septic shock Continue NE + VP for BP support Per ID -> Plan ICD extraction, 6 weeks IV abx. If surveillance cultures clear can consider VAD implant.   4. CAD  -s/p anterior STEMI 12/20. LHC showed chronically occluded RCA (with L>>R collaterals) and thrombotic occlusion of proximal LAD. Underwent PCI of LAD. -LHC this admit with stable CAD -Per primary team. Need to continue aspirin. Could keep off plavix.   5. Aortic Stenosis - AS is severe on TEE.  - Likely low-flow low gradient AS. Mean gradient 18. VR = 0.19 VTI = 7.5 - Presence of fibroelastoma prevents TAVR   6. Elevated HS Trop - 3,500>>4,843 - cath w/ Stable 2v CAD - EKG w/ no STE - suspect demand ischemia in setting of septic shock - Per primary   For  questions or updates, please contact [place] Please consult [email or web address] for contact info under Cardiology/STEMI.  Jacalyn Lefevre, PA-C  12/01/2020 11:47 AM

## 2020-12-01 NOTE — Progress Notes (Signed)
Pharmacy Antibiotic Note  Brandon [name] is a 65 y.o. male admitted on 11/29/2020 with sepsis.  He has been having shaking chills for a few days, has hardware - ICD and barostem and on inflixamab as outpt. S/p one dose vanc and then de-escalated to rocephin respiked fever this am,  WBC elevated 26>14 still spiking temp 103 despite ABX, Bcx strep species recheck Bcx ip Cr 1   Pharmacy has been consulted to restart  vancomycin and change rocephin to cefepime dosing.  Plan: Vancomycin 1500mg  x1 then 1gm q12h eAUC 480 Cefepime 2gm q8h  Height: 6' (182.9 cm) Weight: 94 kg (207 lb 3.7 oz) IBW/kg (Calculated) : 77.6  Temp (24hrs), Avg:99.8 F (37.7 C), Min:98.9 F (37.2 C), Max:103.3 F (39.6 C)  Recent Labs  Lab 11/29/20 1310 11/29/20 1441 11/29/20 2228 11/29/20 2240 11/30/20 0144 11/30/20 0523 11/30/20 0833 11/30/20 1555 12/01/20 0236  WBC 11.1*  --   --  24.1* 26.1*  --  23.3*  --  14.7*  CREATININE 0.89  --   --  1.05 1.20 1.13  --  1.04 0.95  LATICACIDVEN  --  2.3* 3.8*  --  1.7  --   --   --   --      Estimated Creatinine Clearance: 89.9 mL/min (by C-G formula based on SCr of 0.95 mg/dL).    No Known Allergies  Antimicrobials this admission: Vanc 7/20>7/21  7/22> Rocehin 7/20>7/22  Cefepime 7/22> Dose adjustments this admission:   Microbiology results: 7/20 Bcx strep species     Bonnita Nasuti Pharm.D. CPP, BCPS Clinical Pharmacist [phone] 12/01/2020 8:19 AM

## 2020-12-01 NOTE — Progress Notes (Signed)
Transported pt to CT scan and back without complication.

## 2020-12-02 DIAGNOSIS — J9602 Acute respiratory failure with hypercapnia: Secondary | ICD-10-CM | POA: Diagnosis not present

## 2020-12-02 DIAGNOSIS — J9601 Acute respiratory failure with hypoxia: Secondary | ICD-10-CM | POA: Diagnosis not present

## 2020-12-02 LAB — CULTURE, BLOOD (ROUTINE X 2): Special Requests: ADEQUATE

## 2020-12-02 LAB — CBC
HCT: 27.9 % — ABNORMAL LOW (ref 39.0–52.0)
Hemoglobin: 9.3 g/dL — ABNORMAL LOW (ref 13.0–17.0)
MCH: 29.1 pg (ref 26.0–34.0)
MCHC: 33.3 g/dL (ref 30.0–36.0)
MCV: 87.2 fL (ref 80.0–100.0)
Platelets: 180 10*3/uL (ref 150–400)
RBC: 3.2 MIL/uL — ABNORMAL LOW (ref 4.22–5.81)
RDW: 14.8 % (ref 11.5–15.5)
WBC: 15.5 10*3/uL — ABNORMAL HIGH (ref 4.0–10.5)
nRBC: 0 % (ref 0.0–0.2)

## 2020-12-02 LAB — GLUCOSE, CAPILLARY
Glucose-Capillary: 116 mg/dL — ABNORMAL HIGH (ref 70–99)
Glucose-Capillary: 122 mg/dL — ABNORMAL HIGH (ref 70–99)
Glucose-Capillary: 151 mg/dL — ABNORMAL HIGH (ref 70–99)
Glucose-Capillary: 106 mg/dL — ABNORMAL HIGH (ref 70–99)
Glucose-Capillary: 145 mg/dL — ABNORMAL HIGH (ref 70–99)
Glucose-Capillary: 104 mg/dL — ABNORMAL HIGH (ref 70–99)

## 2020-12-02 LAB — PHOSPHORUS: Phosphorus: 2 mg/dL — ABNORMAL LOW (ref 2.5–4.6)

## 2020-12-02 LAB — BASIC METABOLIC PANEL
Anion gap: 10 (ref 5–15)
BUN: 25 mg/dL — ABNORMAL HIGH (ref 8–23)
CO2: 21 mmol/L — ABNORMAL LOW (ref 22–32)
Calcium: 7.8 mg/dL — ABNORMAL LOW (ref 8.9–10.3)
Chloride: 104 mmol/L (ref 98–111)
Creatinine, Ser: 0.82 mg/dL (ref 0.61–1.24)
GFR, Estimated: >60 mL/min (ref >60)
Glucose, Bld: 128 mg/dL — ABNORMAL HIGH (ref 70–99)
Potassium: 3.5 mmol/L (ref 3.5–5.1)
Sodium: 135 mmol/L (ref 135–145)

## 2020-12-02 LAB — COOXEMETRY PANEL
Carboxyhemoglobin: 1 % (ref 0.5–1.5)
Methemoglobin: 0.9 % (ref 0.0–1.5)
O2 Saturation: 68.8 %
Total hemoglobin: 8.6 g/dL — ABNORMAL LOW (ref 12.0–16.0)

## 2020-12-02 LAB — MAGNESIUM: Magnesium: 2.2 mg/dL (ref 1.7–2.4)

## 2020-12-02 MED ORDER — PANTOPRAZOLE SODIUM 40 MG PO TBEC
40.0000 mg | DELAYED_RELEASE_TABLET | Freq: Every day | ORAL | Status: DC
  Administered 2020-12-03 – 2020-12-11 (×9): 40 mg via ORAL
  Filled 2020-12-02 (×9): qty 1

## 2020-12-02 MED ORDER — OXYCODONE HCL 5 MG PO TABS
5.0000 mg | ORAL_TABLET | ORAL | Status: DC | PRN
  Administered 2020-12-02 – 2020-12-03 (×4): 10 mg via ORAL
  Administered 2020-12-03: 5 mg via ORAL
  Administered 2020-12-03: 10 mg via ORAL
  Administered 2020-12-04: 5 mg via ORAL
  Administered 2020-12-04 – 2020-12-06 (×8): 10 mg via ORAL
  Administered 2020-12-06 (×3): 5 mg via ORAL
  Administered 2020-12-06 – 2020-12-10 (×10): 10 mg via ORAL
  Filled 2020-12-02 (×9): qty 2
  Filled 2020-12-02: qty 1
  Filled 2020-12-02 (×6): qty 2
  Filled 2020-12-02: qty 1
  Filled 2020-12-02 (×5): qty 2
  Filled 2020-12-02: qty 1
  Filled 2020-12-02 (×2): qty 2
  Filled 2020-12-02 (×2): qty 1
  Filled 2020-12-02: qty 2

## 2020-12-02 MED ORDER — OXYCODONE HCL 5 MG PO TABS
5.0000 mg | ORAL_TABLET | ORAL | Status: DC | PRN
  Administered 2020-12-02: 5 mg via ORAL
  Filled 2020-12-02: qty 1

## 2020-12-02 MED ORDER — POTASSIUM CHLORIDE 20 MEQ PO PACK
40.0000 meq | PACK | Freq: Once | ORAL | Status: AC
  Administered 2020-12-02: 40 meq
  Filled 2020-12-02: qty 2

## 2020-12-02 MED ORDER — FUROSEMIDE 10 MG/ML IJ SOLN
20.0000 mg | Freq: Once | INTRAMUSCULAR | Status: AC
  Administered 2020-12-02: 20 mg via INTRAVENOUS
  Filled 2020-12-02: qty 2

## 2020-12-02 MED ORDER — DOCUSATE SODIUM 100 MG PO CAPS
100.0000 mg | ORAL_CAPSULE | Freq: Two times a day (BID) | ORAL | Status: DC
  Administered 2020-12-05 – 2020-12-09 (×7): 100 mg via ORAL
  Filled 2020-12-02 (×13): qty 1

## 2020-12-02 MED ORDER — ATORVASTATIN CALCIUM 80 MG PO TABS
80.0000 mg | ORAL_TABLET | Freq: Every day | ORAL | Status: DC
  Administered 2020-12-03 – 2020-12-11 (×9): 80 mg via ORAL
  Filled 2020-12-02 (×9): qty 1

## 2020-12-02 MED ORDER — POTASSIUM CHLORIDE CRYS ER 20 MEQ PO TBCR
40.0000 meq | EXTENDED_RELEASE_TABLET | Freq: Once | ORAL | Status: DC
  Filled 2020-12-02: qty 2

## 2020-12-02 MED ORDER — POLYETHYLENE GLYCOL 3350 17 G PO PACK
17.0000 g | PACK | Freq: Every day | ORAL | Status: DC
  Administered 2020-12-07 – 2020-12-08 (×2): 17 g via ORAL
  Filled 2020-12-02 (×6): qty 1

## 2020-12-02 NOTE — Progress Notes (Signed)
Advanced Heart Failure Rounding Note  PCP-Cardiologist: Olga Millers, MD  [facility]: Dr. 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 Post procedure, developed severe rigors, n/v and fever to 102 then developed acute hypoxic respiratory failure, requiring intubation - 7/21 Blood Cx + for Strep. 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 Mean gradient 18. VR = 0.19 VTI = 7.5. RV mild HK - 7/22 CT C/A/P notable for aspiration PNA. No evidence septic emboli  Subjective:    Remains intubated. Awake on vent and following commands. VP off. On NE 14 (and weaning)  Co-ox 69%  BCx + for strep gordonii. F/u cx  cx NGTD.   CVP 15  Objective:   Weight Range: 88 kg Body mass index is 26.31 kg/m.   Vital Signs:   Temp:  [97.8 F (36.6 C)-100.6 F (38.1 C)] 98.9 F (37.2 C) (07/23 0400) Pulse Rate:  [91-106] 106 (07/23 0400) Resp:  [6-43] 26 (07/23 0732) BP: (77-118)/(56-81) 115/81 (07/23 0600) SpO2:  [94 %-100 %] 99 % (07/23 0732) Arterial Line BP: (75-162)/(53-159) 130/76 (07/23 0700) FiO2 (%):  [30 %] 30 % (07/23 0732) Weight:  [88 kg] 88 kg (07/23 0500) Last BM Date: 11/28/20  Weight change: Filed Weights   11/30/20 0600 12/01/20 0500 12/02/20 0500  Weight: 94.6 kg 94 kg 88 kg    Intake/Output:   Intake/Output Summary (Last 24 hours) at 12/02/2020 0740 Last data filed at 12/02/2020 0700 Gross per 24 hour  Intake 2804.3 ml  Output 1260 ml  Net 1544.3 ml       Physical Exam    General:  Awake on vent. Following commands HEENT: normal +ETT Neck: supple. JVP to ear Carotids 2+ bilat; no  bruits. No lymphadenopathy or thryomegaly appreciated. Cor: PMI nondisplaced. Tachy regular 2/6 AS Lungs: decreased at bases Abdomen: soft, nontender, nondistended. No hepatosplenomegaly. No bruits or masses. Good bowel sounds. Extremities: no cyanosis, clubbing, rash, edema Neuro: alert & orientedx3, cranial nerves grossly intact. moves all 4 extremities w/o difficulty. Affect pleasant   Telemetry   Sinus tach 100-110 Personally reviewed   Labs    CBC Recent Labs    11/30/20 0144 11/30/20 0237 12/01/20 0236 12/02/20 0350  WBC 26.1*   < > 14.7* 15.5*  NEUTROABS 23.7*  --  12.6*  --   HGB 11.7*   < > 9.7* 9.3*  HCT 35.2*   < > 29.2* 27.9*  MCV 87.3   < > 86.6 87.2  PLT 322   < > 213 180   < > = values in this interval not displayed.    Basic Metabolic Panel Recent Labs    12/39/35 0236 12/02/20 0356  NA 133* 135  K 4.1 3.5  CL 107 104  CO2 19* 21*  GLUCOSE 139* 128*  BUN 22 25*  CREATININE 0.95 0.82  CALCIUM 8.0* 7.8*  MG 2.1 2.2  PHOS 1.6* 2.0*    Liver Function Tests Recent Labs    11/29/20 1310 11/30/20 0833  AST 36 63*  ALT 56* 70*  ALKPHOS 71 66  BILITOT 1.1 1.3*  PROT  7.4 6.3*  ALBUMIN 3.0* 2.3*    Recent Labs    11/30/20 0522  LIPASE 47    Cardiac Enzymes No results for input(s): CKTOTAL, CKMB, CKMBINDEX, TROPONINI in the last 72 hours.  BNP: BNP (last 3 results) Recent Labs    11/01/20 1224 11/29/20 2240 11/30/20 0144  BNP 462.1* 1,586.1* 2,653.1*     ProBNP (last 3 results) Recent Labs    03/23/20 0922  PROBNP 520*      D-Dimer No results for input(s): DDIMER in the last 72 hours. Hemoglobin A1C Recent Labs    11/30/20 0833  HGBA1C 6.4*    Fasting Lipid Panel Recent Labs    11/30/20 0523  TRIG 99    Thyroid Function Tests No results for input(s): TSH, T4TOTAL, T3FREE, THYROIDAB in the last 72 hours.  Invalid input(s): FREET3  Other results:   Imaging    CT ABDOMEN PELVIS WO CONTRAST  Result  Date: 12/01/2020 CLINICAL DATA:  Fever with respiratory failure and abdominal distension. Chest pain or shortness of breath, pleurisy or effusion suspected. Abdominal abscess/infection suspected. EXAM: CT CHEST, ABDOMEN AND PELVIS WITHOUT CONTRAST TECHNIQUE: Multidetector CT imaging of the chest, abdomen and pelvis was performed following the standard protocol without IV contrast. COMPARISON:  Chest radiographs 12/01/2020 and CTs 11/21/2020 FINDINGS: CT CHEST FINDINGS Cardiovascular: Atherosclerosis of the aorta, great vessels and coronary arteries again noted. No acute vascular findings are seen on noncontrast imaging. There are calcifications of the aortic valve. Left subclavian pacemaker appears unchanged. Presumed right-sided deep brain stimulator appears unchanged. Right IJ venous catheter extends to the level of the upper SVC. The heart size is normal. There is no pericardial effusion. Mediastinum/Nodes: There are no enlarged mediastinal, hilar or axillary lymph nodes. Endotracheal and nasogastric tubes are in place. The thyroid gland, trachea and esophagus demonstrate no significant findings. Lungs/Pleura: Since the previous CT, the patient has developed small to moderate dependent pleural effusions and associated bibasilar airspace opacities. Underlying changes of moderate centrilobular emphysema with chronic central airway thickening. Stable 3 mm right upper lobe nodule on image 29/5. Musculoskeletal/Chest wall: No chest wall mass or acute osseous findings. Chronic diffuse thoracic spine ankylosis consistent with ankylosing spondylitis. CT ABDOMEN AND PELVIS FINDINGS Hepatobiliary: No focal hepatic abnormalities are identified on noncontrast imaging. There are multiple small calcified gallstones. No evidence of gallbladder wall thickening or biliary dilatation. Pancreas: Unremarkable. No pancreatic ductal dilatation or surrounding inflammatory changes. Spleen: Normal in size without focal abnormality.  Adrenals/Urinary Tract: Both adrenal glands appear normal. No evidence of urinary tract calculus or hydronephrosis. There is no asymmetric perinephric soft tissue stranding. Bladder is decompressed by Foley catheter. A small amount of gas is present in the bladder lumen. Stomach/Bowel: No enteric contrast administered. Nasogastric tube extends into the proximal duodenum. The stomach appears unremarkable for its degree of distension. No evidence of bowel wall thickening, distention or surrounding inflammatory change. The appendix appears normal. Vascular/Lymphatic: There are no enlarged abdominal or pelvic lymph nodes. Diffuse aortic and branch vessel atherosclerosis. Reproductive: The prostate gland and seminal vesicles appear unremarkable. Other: Trace pelvic and right pericolic gutter ascites without focal extraluminal fluid collection or gas. Musculoskeletal: No acute or significant osseous findings. Previous PLIF at L5-S1. Diffuse ankylosis of the spine and sacroiliac joints consistent with ankylosing spondylitis. IMPRESSION: 1. New bilateral pleural effusions and dependent airspace opacities in both lungs compared with CT of 10 days prior, suspicious for possible aspiration. 2. Trace abdominal ascites without focal fluid collection or other inflammatory changes to suggest  intra-abdominal abscess. 3. Support system positioned as above. 4. Cholelithiasis without evidence of cholecystitis or biliary dilatation. 5. Ankylosing spondylitis. 6. Aortic Atherosclerosis (ICD10-I70.0) and Emphysema (ICD10-J43.9). Electronically Signed   By: Richardean Sale M.D.   On: 12/01/2020 11:34   CT CHEST WO CONTRAST  Result Date: 12/01/2020 CLINICAL DATA:  Fever with respiratory failure and abdominal distension. Chest pain or shortness of breath, pleurisy or effusion suspected. Abdominal abscess/infection suspected. EXAM: CT CHEST, ABDOMEN AND PELVIS WITHOUT CONTRAST TECHNIQUE: Multidetector CT imaging of the chest, abdomen and  pelvis was performed following the standard protocol without IV contrast. COMPARISON:  Chest radiographs 12/01/2020 and CTs 11/21/2020 FINDINGS: CT CHEST FINDINGS Cardiovascular: Atherosclerosis of the aorta, great vessels and coronary arteries again noted. No acute vascular findings are seen on noncontrast imaging. There are calcifications of the aortic valve. Left subclavian pacemaker appears unchanged. Presumed right-sided deep brain stimulator appears unchanged. Right IJ venous catheter extends to the level of the upper SVC. The heart size is normal. There is no pericardial effusion. Mediastinum/Nodes: There are no enlarged mediastinal, hilar or axillary lymph nodes. Endotracheal and nasogastric tubes are in place. The thyroid gland, trachea and esophagus demonstrate no significant findings. Lungs/Pleura: Since the previous CT, the patient has developed small to moderate dependent pleural effusions and associated bibasilar airspace opacities. Underlying changes of moderate centrilobular emphysema with chronic central airway thickening. Stable 3 mm right upper lobe nodule on image 29/5. Musculoskeletal/Chest wall: No chest wall mass or acute osseous findings. Chronic diffuse thoracic spine ankylosis consistent with ankylosing spondylitis. CT ABDOMEN AND PELVIS FINDINGS Hepatobiliary: No focal hepatic abnormalities are identified on noncontrast imaging. There are multiple small calcified gallstones. No evidence of gallbladder wall thickening or biliary dilatation. Pancreas: Unremarkable. No pancreatic ductal dilatation or surrounding inflammatory changes. Spleen: Normal in size without focal abnormality. Adrenals/Urinary Tract: Both adrenal glands appear normal. No evidence of urinary tract calculus or hydronephrosis. There is no asymmetric perinephric soft tissue stranding. Bladder is decompressed by Foley catheter. A small amount of gas is present in the bladder lumen. Stomach/Bowel: No enteric contrast  administered. Nasogastric tube extends into the proximal duodenum. The stomach appears unremarkable for its degree of distension. No evidence of bowel wall thickening, distention or surrounding inflammatory change. The appendix appears normal. Vascular/Lymphatic: There are no enlarged abdominal or pelvic lymph nodes. Diffuse aortic and branch vessel atherosclerosis. Reproductive: The prostate gland and seminal vesicles appear unremarkable. Other: Trace pelvic and right pericolic gutter ascites without focal extraluminal fluid collection or gas. Musculoskeletal: No acute or significant osseous findings. Previous PLIF at L5-S1. Diffuse ankylosis of the spine and sacroiliac joints consistent with ankylosing spondylitis. IMPRESSION: 1. New bilateral pleural effusions and dependent airspace opacities in both lungs compared with CT of 10 days prior, suspicious for possible aspiration. 2. Trace abdominal ascites without focal fluid collection or other inflammatory changes to suggest intra-abdominal abscess. 3. Support system positioned as above. 4. Cholelithiasis without evidence of cholecystitis or biliary dilatation. 5. Ankylosing spondylitis. 6. Aortic Atherosclerosis (ICD10-I70.0) and Emphysema (ICD10-J43.9). Electronically Signed   By: Richardean Sale M.D.   On: 12/01/2020 11:34   VAS [place] LOWER EXTREMITY VENOUS (DVT)  Result Date: 12/01/2020  Lower Venous DVT Study Patient Name:  Brandon [name]  Date of Exam:   12/01/2020 Medical Rec #: [id number]       Accession #:    [id number] Date of Birth: July 04, 1955        Patient Gender: M Patient Age:   65Y Exam Location:  [facility] Procedure:      VAS [place] LOWER EXTREMITY VENOUS (DVT) Referring Phys: [id number] [place] --------------------------------------------------------------------------------  Indications: Edema.  Limitations: Poor ultrasound/tissue interface. Comparison Study: No prior study Performing Technologist: Maudry Mayhew MHA, RDMS, RVT, RDCS   Examination Guidelines: A complete evaluation includes B-mode imaging, spectral Doppler, color Doppler, and power Doppler as needed of all accessible portions of each vessel. Bilateral testing is considered an integral part of a complete examination. Limited examinations for reoccurring indications may be performed as noted. The reflux portion of the exam is performed with the patient in reverse Trendelenburg.  +---------+---------------+---------+-----------+----------+--------------+ RIGHT    CompressibilityPhasicitySpontaneityPropertiesThrombus Aging +---------+---------------+---------+-----------+----------+--------------+ CFV      Full           Yes      Yes                                 +---------+---------------+---------+-----------+----------+--------------+ SFJ      Full                                                        +---------+---------------+---------+-----------+----------+--------------+ FV Prox  Full                                                        +---------+---------------+---------+-----------+----------+--------------+ FV Mid   Full                                                        +---------+---------------+---------+-----------+----------+--------------+ FV DistalFull                                                        +---------+---------------+---------+-----------+----------+--------------+ PFV      Full                                                        +---------+---------------+---------+-----------+----------+--------------+ POP      Full           Yes      Yes                                 +---------+---------------+---------+-----------+----------+--------------+ PTV      Full                                                        +---------+---------------+---------+-----------+----------+--------------+ PERO     Full                                                         +---------+---------------+---------+-----------+----------+--------------+   +---------+---------------+---------+-----------+----------+--------------+  LEFT     CompressibilityPhasicitySpontaneityPropertiesThrombus Aging +---------+---------------+---------+-----------+----------+--------------+ CFV      Full           Yes      Yes                                 +---------+---------------+---------+-----------+----------+--------------+ SFJ      Full                                                        +---------+---------------+---------+-----------+----------+--------------+ FV Prox  Full                                                        +---------+---------------+---------+-----------+----------+--------------+ FV Mid   Full                                                        +---------+---------------+---------+-----------+----------+--------------+ FV DistalFull                                                        +---------+---------------+---------+-----------+----------+--------------+ PFV      Full                                                        +---------+---------------+---------+-----------+----------+--------------+ POP      Full           Yes      Yes                                 +---------+---------------+---------+-----------+----------+--------------+ PTV      Full                                                        +---------+---------------+---------+-----------+----------+--------------+ PERO     Full                                                        +---------+---------------+---------+-----------+----------+--------------+     Summary: RIGHT: - There is no evidence of deep vein thrombosis in the lower extremity.  - No cystic structure found in the popliteal fossa.  LEFT: - There is no evidence of deep vein thrombosis in the lower extremity.  - No cystic  structure found in the popliteal fossa.   *See table(s) above for measurements and observations. Electronically signed by Jamelle Haring on 12/01/2020 at 4:42:19 PM.    Final    CT MAXILLOFACIAL WO CONTRAST  Result Date: 12/01/2020 CLINICAL DATA:  Nasal abscess. EXAM: CT MAXILLOFACIAL WITHOUT CONTRAST TECHNIQUE: Multidetector CT imaging of the maxillofacial structures was performed. Multiplanar CT image reconstructions were also generated. COMPARISON:  None. FINDINGS: Osseous: No fracture or mandibular dislocation. No destructive process. Orbits: Negative. No traumatic or inflammatory finding. Sinuses: Clear. Soft tissues: Negative. Limited intracranial: No significant or unexpected finding. IMPRESSION: No definite abnormality seen in maxillofacial region. Electronically Signed   By: Marijo Conception M.D.   On: 12/01/2020 11:26     Medications:     Scheduled Medications:  atorvastatin  80 mg Per Tube Daily   chlorhexidine gluconate (MEDLINE KIT)  15 mL Mouth Rinse BID   Chlorhexidine Gluconate Cloth  6 each Topical Daily   docusate  100 mg Per Tube BID   enoxaparin (LOVENOX) injection  40 mg Subcutaneous Q24H   feeding supplement (PROSource TF)  45 mL Per Tube BID   insulin aspart  0-15 Units Subcutaneous Q4H   mouth rinse  15 mL Mouth Rinse 10 times per day   pantoprazole sodium  40 mg Per Tube Daily   polyethylene glycol  17 g Per Tube Daily   sodium chloride flush  3 mL Intravenous Q12H   sodium chloride flush  3 mL Intravenous Q12H    Infusions:  sodium chloride     sodium chloride     sodium chloride Stopped (11/30/20 0631)   cefTRIAXone (ROCEPHIN)  IV Stopped (12/01/20 1735)   dexmedetomidine (PRECEDEX) IV infusion Stopped (11/30/20 0037)   epinephrine Stopped (11/30/20 0220)   feeding supplement (VITAL 1.5 CAL) 20 mL/hr at 12/02/20 0600   fentaNYL infusion INTRAVENOUS 100 mcg/hr (12/02/20 0700)   metronidazole Stopped (12/02/20 0438)   norepinephrine (LEVOPHED) Adult infusion 19 mcg/min (12/02/20 0700)   propofol  (DIPRIVAN) infusion Stopped (12/02/20 0308)   vancomycin Stopped (12/01/20 2323)   vasopressin Stopped (12/01/20 2158)    PRN Medications: sodium chloride, sodium chloride, acetaminophen (TYLENOL) oral liquid 160 mg/5 mL, acetaminophen, fentaNYL (SUBLIMAZE) injection, fentaNYL (SUBLIMAZE) injection, ondansetron (ZOFRAN) IV, sodium chloride flush, sodium chloride flush    Patient Profile   65 year old with a history of h/o CAD s/p previous MI, HTN, HL, ST jude ICD, S/P Barostim, and chronic systolic heart failure, EF 20-25%.  Presented 7/20 for outpatient RHC as part of VAD w/u. Post procedure, developed severe rigors, n/v and fever to 102 several hours after cardiac catheterization while in short stay. IVFs given for hypotension, labs and blood cx drawn. Admitted to ICU and started on broad spectrum  abx. Later developed acute hypoxic respiratory failure, requiring intubation. Blood Cx + for Strep.   TEE w/ with no evidence of endocarditis. 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 Likely low-flow low gradient AS. Mean gradient 18. VR = 0.19 VTI = 7.5. LVEF 20%. RV mild HK  Assessment/Plan   Strep Bacteremia  - Blood cx + for Strep gordonii - Repeat cx 7/21 NGTD  - TEE w/ with no clear evidence of endocarditis (PFE on Aov -> cannot completely exclude infection)  - ID has seen - now on vanc. Flagyl, ceftriaxone to cover endocarditis and aspiration PNA  - has ICD and BaroStim. EP has seen ICD to come out next week (ok to leave Baromstim as it is extravascular  2. Septic Shock  -  in setting of bacteremia and aspiration PNA - c/w abx per ID - see above - OF VP. Weaning NE  3. Acute Hypoxic Respiratory Failure - intubated - Vent management per PCCM  - likely extubate soon  4. Chronic Systolic Heart Failure, ICM  - Echo 12/20 EF post anterior MI 30 to 35%. - Echo 6/21 EF 25-30% RV ok  - Echo 05/19/20 EF 20-25% RV mildly HK. moderate AS  Mean gradient 13 AVA 1.2 cm2 DI 0.30 - Initial RHC this admit  with low filling pressures. CI marginal at 2.3 - hold eplerenone, entresto, farxiga, and carvedilol in setting of septic shock  - Co-ox 69% wean NE as tolerated - Wt up 10 lb w/ IVF resuscitation. CVP 15. SCr stable, begin gentle diuresis to facilitate extubation. Do not overdiurese with AS - concerned he will need advanced therpaies. Likely not transplant candidate given age and lung disease. Being elevated for LVAD, but currently not a candidate for implant given active infection/ bacteremia.  - Spoke with ID. Plan ICD extraction, 6 weeks IV abx. If surveillance cultures clear x 4 weeks can consider VAD implant.   5. CAD/NSTEMI   -s/p anterior STEMI 12/20. LHC showed chronically occluded RCA (with L>>R collaterals) and thrombotic occlusion of proximal LAD. Underwent PCI of LAD. -LHC this admit with stable CAD -Had NSTEMI in setting of septic shock -Continue ASA/statin. No plavix with upcoming VAD  6. Aortic Stenosis - AS is severe on TEE.  - Likely low-flow low gradient AS. Mean gradient 18. VR = 0.19 VTI = 7.5 - Presence of fibroelastoma prevents TAVR   CRITICAL CARE Performed by: Glori Bickers  Total critical care time: 35 minutes  Critical care time was exclusive of separately billable procedures and treating other patients.  Critical care was necessary to treat or prevent imminent or life-threatening deterioration.  Critical care was time spent personally by me (independent of midlevel providers or residents) on the following activities: development of treatment plan with patient and/or surrogate as well as nursing, discussions with consultants, evaluation of patient's response to treatment, examination of patient, obtaining history from patient or surrogate, ordering and performing treatments and interventions, ordering and review of laboratory studies, ordering and review of radiographic studies, pulse oximetry and re-evaluation of patient's condition.   Length of Stay:  3  Glori Bickers, MD  12/02/2020, 7:40 AM  Advanced Heart Failure Team Pager [phone] (M-F; 7a - 5p)  Please contact [facility] for night-coverage after hours (5p -7a ) and weekends on [email or web address]

## 2020-12-02 NOTE — Procedures (Signed)
Extubation Procedure Note  Patient Details:   Name: Brandon [name] DOB: 1955/07/26 MRN: [id number]   Airway Documentation:    Vent end date: 12/02/20 Vent end time: 1131   Evaluation  O2 sats: stable throughout Complications: No apparent complications Patient did tolerate procedure well. Bilateral Breath Sounds: Clear, Diminished   Yes, pt could speak post extubation.  Pt extubated to 2 l/m [place] per physician's order.  Earney Navy 12/02/2020, 11:32 AM

## 2020-12-02 NOTE — Progress Notes (Signed)
Called to evaluate R hand Dermatomal pattern palmar pain and pins/needle sensation Good cap refill Good ulnar and radial pulses confirmed with ultrasound Seems most c/w with some sort of neuropathy related to his severe cervical issues combined with abnormal neck positioning during intubation. Reassured patient, will monitor over time.  Erskine Emery MD PCCM

## 2020-12-02 NOTE — Progress Notes (Signed)
NAME:  Brandon [name], MRN:  [id number], DOB:  Apr 16, 1956, LOS: 3 ADMISSION DATE:  11/29/2020, CONSULTATION DATE:  12/02/20 REFERRING MD:  cardiology, CHIEF COMPLAINT:  respiratory failure   History of Present Illness:  65 y.o. M with PMH of HFrEF, CAD s/p previous MI with [facility] Jude ICD and barrel stem, hypertension, hyperlipidemia also under TAVR consideration who presented for scheduled right and left heart cath on 7/20, which showed stable two-vessel CAD , EF 20% and heavily calcified aortic valve.   After his procedure, he developed chills, nausea, vomiting and shortness of breath.  He reported nausea and vomiting for the last several weeks.  His temperature climbed to 102F and oxygen saturations dropped into the 70%'s along with sinus tachycardia.  He was admitted and placed on nonrebreather along with vancomycin and ceftriaxone, overnight he developed progressive respiratory distress and tachycardia, PCCM consulted and patient ultimately required intubation and vasopressor support  Pertinent  Medical History   has a past medical history of AICD (automatic cardioverter/defibrillator) present (08/31/2019), Ankylosing spondylitis ([place]), Arthritis, BENIGN PROSTATIC HYPERTROPHY (06/07/2008), CHF (congestive heart failure) ([place]), COLONIC POLYPS, HX OF (06/07/2008), Depression, H/O hiatal hernia, Heart failure ([place]), HYPERLIPIDEMIA (06/07/2008), HYPERTENSION (06/07/2008), Myocardial infarction ([place]) (2005), NEPHROLITHIASIS, HX OF (06/07/2008), and STEMI (ST elevation myocardial infarction) ([place]) (04/27/2019).  Significant Hospital Events: Including procedures, antibiotic start and stop dates in addition to other pertinent events   7/20 underwent right and left heart cath, postop decompensation and admission.  Vancomycin and ceftriaxone initiated 7/21 respiratory distress overnight along with shock, PCCM consulted and patient intubated along with placement of central line and arterial line  Interim History  / Subjective:  No further fever spikes. Awake following commands. CVP up a bit  Objective   Blood pressure 115/81, pulse (!) 106, temperature 98.9 F (37.2 C), temperature source Oral, resp. rate (!) 26, height 6' (1.829 m), weight 88 kg, SpO2 99 %. CVP:  [13 mmHg-16 mmHg] 13 mmHg  Vent Mode: PRVC FiO2 (%):  [30 %] 30 % Set Rate:  [24 bmp] 24 bmp Vt Set:  [160 mL] 620 mL PEEP:  [8 cmH20-10 cmH20] 8 cmH20 Plateau Pressure:  [22 cmH20-30 cmH20] 22 cmH20   Intake/Output Summary (Last 24 hours) at 12/02/2020 0800 Last data filed at 12/02/2020 0700 Gross per 24 hour  Intake 2748.44 ml  Output 1135 ml  Net 1613.44 ml    Filed Weights   11/30/20 0600 12/01/20 0500 12/02/20 0500  Weight: 94.6 kg 94 kg 88 kg   No distress Moves all 4 ext Trace LE edema Lungs diminished at bases   Resolved Hospital Problem list     Assessment & Plan:  Strep bacteremia with likely seeding of ICD Poor dentition could be source fo above Acute hypoxemic respiratory failure with abnormal CXR- probably was edema, improved with PEEP now some residual aspiration-type infiltrates on CT Baseline severe cardiomyopathy- in workup for LVAD, 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, coox okay here; AICD in place but to be removed due to possible seeding Emphysema on CT  - I think fine to narrow to ceftriaxone for now, duration 6 weeks - ICD explantation this week - Lasix this AM and potential extubation depending on response - Destination therapies on hold while infected - Progressive mobility once extubated, qHS bipap may be helpful if he can tolerate  Best Practice (right click and "Reselect all SmartList Selections" daily)   Diet/type: tubefeeds DVT prophylaxis: LMWH GI prophylaxis: PPI Lines: Central line Foley:  Yes,  and it is still needed Code Status:  full code Last date of multidisciplinary goals of care discussion [per primary]  Patient critically ill due to respiratory failure, shock,  sepsis Interventions to address this today pressor and vent titration Risk of deterioration without these interventions is high  I personally spent 32 minutes providing critical care not including any separately billable procedures  Erskine Emery MD [facility]  Prefer epic messenger for cross cover needs If after hours, please call E-link

## 2020-12-02 NOTE — Progress Notes (Signed)
[place] for Infectious Disease   Reason for visit: Follow up on bacteremia  Interval History: remains intubated, no fever last 24 hours; WBC stable at 15.5.   Repeat blood cultures 7/21 ngtd  Blood cultures 7/20 4/4 Streptococcus gordonii  Physical Exam: Constitutional:  Vitals:   12/02/20 0732 12/02/20 1027  BP:    Pulse:    Resp: (!) 26 (!) 24  Temp:    SpO2: 99% 99%   patient appears in NAD, alert on vent Eyes: anicteric HENT: + ET Respiratory: respiratory effort on vent Cardiovascular: RRR MS: no edema  Review of Systems: Unable to be assessed due to patient factors  Lab Results  Component Value Date   WBC 15.5 (H) 12/02/2020   HGB 9.3 (L) 12/02/2020   HCT 27.9 (L) 12/02/2020   MCV 87.2 12/02/2020   PLT 180 12/02/2020    Lab Results  Component Value Date   CREATININE 0.82 12/02/2020   BUN 25 (H) 12/02/2020   NA 135 12/02/2020   K 3.5 12/02/2020   CL 104 12/02/2020   CO2 21 (L) 12/02/2020    Lab Results  Component Value Date   ALT 70 (H) 11/30/2020   AST 63 (H) 11/30/2020   ALKPHOS 66 11/30/2020     Microbiology: Recent Results (from the past 240 hour(s))  Culture, blood (routine x 2)     Status: Abnormal   Collection Time: 11/29/20 12:54 PM   Specimen: BLOOD  Result Value Ref Range Status   Specimen Description BLOOD SITE NOT SPECIFIED  Final   Special Requests   Final    BOTTLES DRAWN AEROBIC AND ANAEROBIC Blood Culture adequate volume   Culture  Setup Time   Final    GRAM POSITIVE COCCI IN CHAINS IN BOTH AEROBIC AND ANAEROBIC BOTTLES CRITICAL VALUE NOTED.  VALUE IS CONSISTENT WITH PREVIOUSLY REPORTED AND CALLED VALUE.    Culture (A)  Final    STREPTOCOCCUS GORDONII SUSCEPTIBILITIES PERFORMED ON PREVIOUS CULTURE WITHIN THE LAST 5 DAYS. Performed at [facility], [place] [address] [place], [place] [address]    Report Status 12/02/2020 FINAL  Final  Culture, blood (routine x 2)     Status: Abnormal   Collection Time: 11/29/20   1:04 PM   Specimen: BLOOD RIGHT HAND  Result Value Ref Range Status   Specimen Description BLOOD RIGHT HAND  Final   Special Requests   Final    BOTTLES DRAWN AEROBIC AND ANAEROBIC Blood Culture results may not be optimal due to an excessive volume of blood received in culture bottles   Culture  Setup Time   Final    GRAM POSITIVE COCCI IN CHAINS IN BOTH AEROBIC AND ANAEROBIC BOTTLES CRITICAL RESULT CALLED TO, READ BACK BY AND VERIFIED WITH: C. AMEND,PHARMD [id number] 11/30/2020 T. TYSOR Performed at [facility], [place] [address], [place], [place] [address]    Culture STREPTOCOCCUS GORDONII (A)  Final   Report Status 12/02/2020 FINAL  Final   Organism ID, Bacteria STREPTOCOCCUS GORDONII  Final      Susceptibility   Streptococcus gordonii - MIC*    PENICILLIN <=0.06 SENSITIVE Sensitive     CEFTRIAXONE <=0.12 SENSITIVE Sensitive     ERYTHROMYCIN <=0.12 SENSITIVE Sensitive     LEVOFLOXACIN 1 SENSITIVE Sensitive     VANCOMYCIN 0.5 SENSITIVE Sensitive     * STREPTOCOCCUS GORDONII  Blood Culture ID Panel (Reflexed)     Status: Abnormal   Collection Time: 11/29/20  1:04 PM  Result Value  Ref Range Status   Enterococcus faecalis NOT DETECTED NOT DETECTED Final   Enterococcus Faecium NOT DETECTED NOT DETECTED Final   Listeria monocytogenes NOT DETECTED NOT DETECTED Final   Staphylococcus species NOT DETECTED NOT DETECTED Final   Staphylococcus aureus (BCID) NOT DETECTED NOT DETECTED Final   Staphylococcus epidermidis NOT DETECTED NOT DETECTED Final   Staphylococcus lugdunensis NOT DETECTED NOT DETECTED Final   Streptococcus species DETECTED (A) NOT DETECTED Final    Comment: Not Enterococcus species, Streptococcus agalactiae, Streptococcus pyogenes, or Streptococcus pneumoniae. CRITICAL RESULT CALLED TO, READ BACK BY AND VERIFIED WITH: C. AMEND,PHARMD [id number] 11/30/2020 T. TYSOR    Streptococcus agalactiae NOT DETECTED NOT DETECTED Final   Streptococcus pneumoniae NOT DETECTED NOT DETECTED  Final   Streptococcus pyogenes NOT DETECTED NOT DETECTED Final   A.calcoaceticus-baumannii NOT DETECTED NOT DETECTED Final   Bacteroides fragilis NOT DETECTED NOT DETECTED Final   Enterobacterales NOT DETECTED NOT DETECTED Final   Enterobacter cloacae complex NOT DETECTED NOT DETECTED Final   Escherichia coli NOT DETECTED NOT DETECTED Final   Klebsiella aerogenes NOT DETECTED NOT DETECTED Final   Klebsiella oxytoca NOT DETECTED NOT DETECTED Final   Klebsiella pneumoniae NOT DETECTED NOT DETECTED Final   Proteus species NOT DETECTED NOT DETECTED Final   Salmonella species NOT DETECTED NOT DETECTED Final   Serratia marcescens NOT DETECTED NOT DETECTED Final   Haemophilus influenzae NOT DETECTED NOT DETECTED Final   Neisseria meningitidis NOT DETECTED NOT DETECTED Final   Pseudomonas aeruginosa NOT DETECTED NOT DETECTED Final   Stenotrophomonas maltophilia NOT DETECTED NOT DETECTED Final   Candida albicans NOT DETECTED NOT DETECTED Final   Candida auris NOT DETECTED NOT DETECTED Final   Candida glabrata NOT DETECTED NOT DETECTED Final   Candida krusei NOT DETECTED NOT DETECTED Final   Candida parapsilosis NOT DETECTED NOT DETECTED Final   Candida tropicalis NOT DETECTED NOT DETECTED Final   Cryptococcus neoformans/gattii NOT DETECTED NOT DETECTED Final    Comment: Performed at [facility], [address], [place], [place] [address]  [name] Next Gen by PCR, Nasal     Status: None   Collection Time: 11/29/20  2:01 PM  Result Value Ref Range Status   [name] by PCR Next Gen NOT DETECTED NOT DETECTED Final    Comment: (NOTE) The GeneXpert [name] Assay (FDA approved for NASAL specimens only), is one component of a comprehensive [name] colonization surveillance program. It is not intended to diagnose [name] infection nor to guide or monitor treatment for [name] infections. Test performance is not FDA approved in patients less than 75 years old. Performed at [facility], [place] [address], [place], [place] [address]   Culture, blood (routine x 2)     Status: None (Preliminary result)   Collection Time: 11/30/20  8:14 AM   Specimen: BLOOD RIGHT HAND  Result Value Ref Range Status   Specimen Description BLOOD RIGHT HAND  Final   Special Requests   Final    BOTTLES DRAWN AEROBIC ONLY Blood Culture adequate volume   Culture   Final    NO GROWTH 2 DAYS Performed at [facility], [place] [address] [place], [place] [address]    Report Status PENDING  Incomplete  Culture, blood (routine x 2)     Status: None (Preliminary result)   Collection Time: 11/30/20  8:20 AM   Specimen: BLOOD LEFT HAND  Result Value Ref Range Status   Specimen Description BLOOD LEFT HAND  Final   Special Requests  Final    BOTTLES DRAWN AEROBIC ONLY Blood Culture results may not be optimal due to an inadequate volume of blood received in culture bottles   Culture   Final    NO GROWTH 2 DAYS Performed at [facility], [place] [address] [place], [place] [address]    Report Status PENDING  Incomplete    Impression/Plan:  1. Bacteremia - positive Strep gordonii and repeat cultures so far without new growth. I agree with change to ceftriaxone, no indication for more broad therapy and I have stopped vancomycin and cefepime has been stopped.   Due to LVAD considerations, plan is for prolonged antibiotics after ICD extraction   2.  Possible aspiration - he has been on piperacillin/tazobactam and metronidazole for several doses.  No signficant concerns clinically with ongoing aspiration as he appears stable, improved vent setting. Will stop metronidazole.  3.  ICD - as above, concern for infection, though no obvious vegetation during TEE but with consideration of LVAD, agree with need to remove potential seeded devices prior to LVAD so plan as above with prolonged IV ceftriaxone.

## 2020-12-03 ENCOUNTER — Inpatient Hospital Stay (HOSPITAL_COMMUNITY): Payer: Medicare Other

## 2020-12-03 DIAGNOSIS — J9602 Acute respiratory failure with hypercapnia: Secondary | ICD-10-CM | POA: Diagnosis not present

## 2020-12-03 DIAGNOSIS — M7989 Other specified soft tissue disorders: Secondary | ICD-10-CM | POA: Diagnosis not present

## 2020-12-03 DIAGNOSIS — M79601 Pain in right arm: Secondary | ICD-10-CM

## 2020-12-03 DIAGNOSIS — J9601 Acute respiratory failure with hypoxia: Secondary | ICD-10-CM | POA: Diagnosis not present

## 2020-12-03 DIAGNOSIS — A419 Sepsis, unspecified organism: Secondary | ICD-10-CM | POA: Diagnosis not present

## 2020-12-03 DIAGNOSIS — R6521 Severe sepsis with septic shock: Secondary | ICD-10-CM | POA: Diagnosis not present

## 2020-12-03 LAB — BASIC METABOLIC PANEL
Anion gap: 7 (ref 5–15)
BUN: 21 mg/dL (ref 8–23)
CO2: 24 mmol/L (ref 22–32)
Calcium: 8 mg/dL — ABNORMAL LOW (ref 8.9–10.3)
Chloride: 104 mmol/L (ref 98–111)
Creatinine, Ser: 0.77 mg/dL (ref 0.61–1.24)
GFR, Estimated: >60 mL/min (ref >60)
Glucose, Bld: 93 mg/dL (ref 70–99)
Potassium: 3.4 mmol/L — ABNORMAL LOW (ref 3.5–5.1)
Sodium: 135 mmol/L (ref 135–145)

## 2020-12-03 LAB — CBC
HCT: 27.3 % — ABNORMAL LOW (ref 39.0–52.0)
Hemoglobin: 8.9 g/dL — ABNORMAL LOW (ref 13.0–17.0)
MCH: 28.5 pg (ref 26.0–34.0)
MCHC: 32.6 g/dL (ref 30.0–36.0)
MCV: 87.5 fL (ref 80.0–100.0)
Platelets: 147 10*3/uL — ABNORMAL LOW (ref 150–400)
RBC: 3.12 MIL/uL — ABNORMAL LOW (ref 4.22–5.81)
RDW: 14.8 % (ref 11.5–15.5)
WBC: 12.7 10*3/uL — ABNORMAL HIGH (ref 4.0–10.5)
nRBC: 0 % (ref 0.0–0.2)

## 2020-12-03 LAB — COOXEMETRY PANEL
Carboxyhemoglobin: 1.4 % (ref 0.5–1.5)
Methemoglobin: 1 % (ref 0.0–1.5)
O2 Saturation: 72.9 %
Total hemoglobin: 8.7 g/dL — ABNORMAL LOW (ref 12.0–16.0)

## 2020-12-03 LAB — HEPARIN LEVEL (UNFRACTIONATED): Heparin Unfractionated: 0.29 IU/mL — ABNORMAL LOW (ref 0.30–0.70)

## 2020-12-03 LAB — GLUCOSE, CAPILLARY
Glucose-Capillary: 91 mg/dL (ref 70–99)
Glucose-Capillary: 146 mg/dL — ABNORMAL HIGH (ref 70–99)
Glucose-Capillary: 109 mg/dL — ABNORMAL HIGH (ref 70–99)
Glucose-Capillary: 138 mg/dL — ABNORMAL HIGH (ref 70–99)
Glucose-Capillary: 105 mg/dL — ABNORMAL HIGH (ref 70–99)

## 2020-12-03 LAB — SURGICAL PCR SCREEN
MRSA, PCR: NEGATIVE
Staphylococcus aureus: POSITIVE — AB

## 2020-12-03 LAB — PHOSPHORUS: Phosphorus: 2 mg/dL — ABNORMAL LOW (ref 2.5–4.6)

## 2020-12-03 LAB — MAGNESIUM: Magnesium: 2.1 mg/dL (ref 1.7–2.4)

## 2020-12-03 MED ORDER — CHLORHEXIDINE GLUCONATE 4 % EX LIQD
60.0000 mL | Freq: Once | CUTANEOUS | Status: AC
  Administered 2020-12-03: 4 via TOPICAL

## 2020-12-03 MED ORDER — HEPARIN BOLUS VIA INFUSION
3000.0000 [IU] | Freq: Once | INTRAVENOUS | Status: AC
  Administered 2020-12-03: 3000 [IU] via INTRAVENOUS
  Filled 2020-12-03: qty 3000

## 2020-12-03 MED ORDER — CEFAZOLIN SODIUM-DEXTROSE 2-4 GM/100ML-% IV SOLN: 2.0000 g | INTRAVENOUS | Status: AC

## 2020-12-03 MED ORDER — SODIUM CHLORIDE 0.9 % IV SOLN: INTRAVENOUS | Status: DC

## 2020-12-03 MED ORDER — CHLORHEXIDINE GLUCONATE CLOTH 2 % EX PADS
6.0000 | MEDICATED_PAD | Freq: Every day | CUTANEOUS | Status: DC
  Administered 2020-12-03: 6 via TOPICAL

## 2020-12-03 MED ORDER — POTASSIUM CHLORIDE CRYS ER 20 MEQ PO TBCR
40.0000 meq | EXTENDED_RELEASE_TABLET | Freq: Once | ORAL | Status: AC
  Administered 2020-12-03: 40 meq via ORAL
  Filled 2020-12-03: qty 2

## 2020-12-03 MED ORDER — DIGOXIN 125 MCG PO TABS
0.1250 mg | ORAL_TABLET | Freq: Every day | ORAL | Status: DC
  Administered 2020-12-03 – 2020-12-11 (×9): 0.125 mg via ORAL
  Filled 2020-12-03 (×9): qty 1

## 2020-12-03 MED ORDER — MUPIROCIN 2 % EX OINT
1.0000 "application " | TOPICAL_OINTMENT | Freq: Two times a day (BID) | CUTANEOUS | Status: AC
  Administered 2020-12-03 – 2020-12-08 (×10): 1 via NASAL
  Filled 2020-12-03 (×3): qty 22

## 2020-12-03 MED ORDER — INSULIN ASPART 100 UNIT/ML IJ SOLN
0.0000 [IU] | Freq: Three times a day (TID) | INTRAMUSCULAR | Status: DC
  Administered 2020-12-03 – 2020-12-06 (×3): 2 [IU] via SUBCUTANEOUS

## 2020-12-03 MED ORDER — MELATONIN 5 MG PO TABS
5.0000 mg | ORAL_TABLET | Freq: Every day | ORAL | Status: DC
  Administered 2020-12-03 – 2020-12-10 (×8): 5 mg via ORAL
  Filled 2020-12-03 (×8): qty 1

## 2020-12-03 MED ORDER — DEXAMETHASONE SODIUM PHOSPHATE 4 MG/ML IJ SOLN
4.0000 mg | Freq: Two times a day (BID) | INTRAMUSCULAR | Status: DC
  Administered 2020-12-03 (×2): 4 mg via INTRAVENOUS
  Filled 2020-12-03 (×2): qty 1

## 2020-12-03 MED ORDER — INSULIN ASPART 100 UNIT/ML IJ SOLN: 0.0000 [IU] | Freq: Every day | INTRAMUSCULAR | Status: DC

## 2020-12-03 MED ORDER — CHLORHEXIDINE GLUCONATE CLOTH 2 % EX PADS
6.0000 | MEDICATED_PAD | Freq: Every day | CUTANEOUS | Status: AC
  Administered 2020-12-04 – 2020-12-08 (×5): 6 via TOPICAL

## 2020-12-03 MED ORDER — FUROSEMIDE 10 MG/ML IJ SOLN
40.0000 mg | Freq: Once | INTRAMUSCULAR | Status: AC
  Administered 2020-12-03: 40 mg via INTRAVENOUS
  Filled 2020-12-03: qty 4

## 2020-12-03 MED ORDER — SODIUM CHLORIDE 0.9 % IV SOLN
80.0000 mg | INTRAVENOUS | Status: AC
  Filled 2020-12-03: qty 2

## 2020-12-03 MED ORDER — GABAPENTIN 100 MG PO CAPS
100.0000 mg | ORAL_CAPSULE | Freq: Three times a day (TID) | ORAL | Status: DC
  Administered 2020-12-03 – 2020-12-11 (×24): 100 mg via ORAL
  Filled 2020-12-03 (×24): qty 1

## 2020-12-03 MED ORDER — TRAZODONE HCL 50 MG PO TABS: 25.0000 mg | ORAL_TABLET | Freq: Every evening | ORAL | Status: DC | PRN

## 2020-12-03 MED ORDER — CHLORHEXIDINE GLUCONATE 4 % EX LIQD
60.0000 mL | Freq: Once | CUTANEOUS | Status: AC
  Administered 2020-12-04: 4 via TOPICAL
  Filled 2020-12-03: qty 60

## 2020-12-03 MED ORDER — HEPARIN (PORCINE) 25000 UT/250ML-% IV SOLN
1500.0000 [IU]/h | INTRAVENOUS | Status: AC
  Administered 2020-12-03: 1400 [IU]/h via INTRAVENOUS
  Filled 2020-12-03: qty 250

## 2020-12-03 NOTE — Progress Notes (Signed)
Patient ID: Brandon [name], male   DOB: [birth year], 65 y.o.   MRN: [id number]     Advanced Heart Failure Rounding Note  PCP-Cardiologist: [name], MD  [facility]: Dr. 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 Post procedure, developed severe rigors, n/v and fever to 102 then developed acute hypoxic respiratory failure, requiring intubation - 7/21 Blood Cx + for Strep. 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 Mean gradient 18. VR = 0.19 VTI = 7.5. RV mild HK - 7/22 CT C/A/P notable for aspiration PNA. No evidence septic emboli - Extubated 7/23  Subjective:    Doing well this morning, off norepinephrine.  I/Os negative with 1 dose of Lasix 40 mg IV yesterday, CVP 10.   Has right arm shooting pain, suspect neuropathic.   BCx + for strep gordonii. F/u cx NGTD. He remains on ceftriaxone.   Objective:   Weight Range: 84.3 kg Body mass index is 25.21 kg/m.   Vital Signs:   Temp:  [97.8 F (36.6 C)-99.4 F (37.4 C)] 98 F (36.7 C) (07/24 0731) Pulse Rate:  [109-116] 109 (07/24 0430) Resp:  [6-35] 31 (07/24 0600) BP: (69-138)/(56-93) 90/65 (07/24 0600) SpO2:  [73 %-100 %] 93 % (07/24 0600) Arterial Line BP: (97-157)/(59-154) 157/154 (07/23 1115) FiO2 (%):  [28 %-30 %] 28 % (07/23 1131) Weight:  [84.3 kg] 84.3 kg (07/24 0500) Last BM Date: 12/02/20  Weight change: Filed Weights   12/01/20 0500 12/02/20 0500 12/03/20 0500  Weight: 94 kg 88 kg 84.3 kg    Intake/Output:   Intake/Output Summary (Last 24 hours) at 12/03/2020 0828 Last data filed at 12/03/2020 0200 Gross per 24 hour  Intake 769.12 ml   Output 1870 ml  Net -1100.88 ml      Physical Exam    General: NAD Neck: JVP 9-10 cm, no thyromegaly or thyroid nodule.  Lungs: Clear to auscultation bilaterally with normal respiratory effort. CV: Nondisplaced PMI.  Heart regular S1/S2, no S3/S4, 2/6 SEM RUSB with some obscuration of S2.  No peripheral edema.   Abdomen: Soft, nontender, no hepatosplenomegaly, no distention.  Skin: Intact without lesions or rashes.  Neurologic: Alert and oriented x 3.  Psych: Normal affect. Extremities: No clubbing or cyanosis.  HEENT: Normal.    Telemetry   NSR 90s. Personally reviewed   Labs    CBC Recent Labs    12/01/20 0236 12/02/20 0350 12/03/20 0450  WBC 14.7* 15.5* 12.7*  NEUTROABS 12.6*  --   --   HGB 9.7* 9.3* 8.9*  HCT 29.2* 27.9* 27.3*  MCV 86.6 87.2 87.5  PLT 213 180 235*   Basic Metabolic Panel Recent Labs    12/02/20 0356 12/03/20 0450  NA 135 135  K 3.5 3.4*  CL 104 104  CO2 21* 24  GLUCOSE 128* 93  BUN 25* 21  CREATININE 0.82 0.77  CALCIUM 7.8* 8.0*  MG 2.2 2.1  PHOS 2.0* 2.0*   Liver Function Tests Recent Labs    11/30/20 0833  AST 63*  ALT 70*  ALKPHOS 66  BILITOT 1.3*  PROT 6.3*  ALBUMIN 2.3*  No results for input(s): LIPASE, AMYLASE in the last 72 hours. Cardiac Enzymes No results for input(s): CKTOTAL, CKMB, CKMBINDEX, TROPONINI in the last 72 hours.  BNP: BNP (last 3 results) Recent Labs    11/01/20 1224 11/29/20 2240 11/30/20 0144  BNP 462.1* 1,586.1* 2,653.1*    ProBNP (last 3 results) Recent Labs    03/23/20 0922  PROBNP 520*     D-Dimer No results for input(s): DDIMER in the last 72 hours. Hemoglobin A1C Recent Labs    11/30/20 0833  HGBA1C 6.4*   Fasting Lipid Panel No results for input(s): CHOL, HDL, LDLCALC, TRIG, CHOLHDL, LDLDIRECT in the last 72 hours. Thyroid Function Tests No results for input(s): TSH, T4TOTAL, T3FREE, THYROIDAB in the last 72 hours.  Invalid input(s): FREET3  Other  results:   Imaging    No results found.   Medications:     Scheduled Medications:  atorvastatin  80 mg Oral Daily   chlorhexidine  60 mL Topical Once   chlorhexidine  60 mL Topical Once   Chlorhexidine Gluconate Cloth  6 each Topical Daily   dexamethasone (DECADRON) injection  4 mg Intravenous Q12H   digoxin  0.125 mg Oral Daily   docusate sodium  100 mg Oral BID   enoxaparin (LOVENOX) injection  40 mg Subcutaneous Q24H   furosemide  40 mg Intravenous Once   gabapentin  100 mg Oral TID   gentamicin irrigation  80 mg Irrigation On Call   insulin aspart  0-15 Units Subcutaneous Q4H   pantoprazole  40 mg Oral Daily   polyethylene glycol  17 g Oral Daily   sodium chloride flush  3 mL Intravenous Q12H   sodium chloride flush  3 mL Intravenous Q12H    Infusions:  sodium chloride     sodium chloride     sodium chloride Stopped (11/30/20 0631)   sodium chloride      ceFAZolin (ANCEF) IV     cefTRIAXone (ROCEPHIN)  IV Stopped (12/02/20 1706)   dexmedetomidine (PRECEDEX) IV infusion Stopped (11/30/20 0037)   epinephrine Stopped (11/30/20 0220)   fentaNYL infusion INTRAVENOUS Stopped (12/02/20 1043)   norepinephrine (LEVOPHED) Adult infusion 2 mcg/min (12/02/20 1800)   vasopressin Stopped (12/01/20 2158)    PRN Medications: sodium chloride, sodium chloride, acetaminophen (TYLENOL) oral liquid 160 mg/5 mL, acetaminophen, fentaNYL (SUBLIMAZE) injection, fentaNYL (SUBLIMAZE) injection, ondansetron (ZOFRAN) IV, oxyCODONE, sodium chloride flush, sodium chloride flush    Patient Profile   65 year old with a history of h/o CAD s/p previous MI, HTN, HL, ST jude ICD, S/P Barostim, and chronic systolic heart failure, EF 20-25%.  Presented 7/20 for outpatient RHC as part of VAD w/u. Post procedure, developed severe rigors, n/v and fever to 102 several hours after cardiac catheterization while in short stay. IVFs given for hypotension, labs and blood cx drawn. Admitted to ICU and  started on broad spectrum  abx. Later developed acute hypoxic respiratory failure, requiring intubation. Blood Cx + for Strep.   TEE w/ with no evidence of endocarditis. 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 Likely low-flow low gradient AS. Mean gradient 18. VR = 0.19 VTI = 7.5. LVEF 20%. RV mild HK  Assessment/Plan   Strep Bacteremia - Blood cx + for Strep gordonii - Repeat cx 7/21 NGTD  - TEE w/ with no clear evidence of endocarditis (fibroelastoma on Aov -> cannot completely exclude infection)  - ID has seen - now on ceftiraxone.   - has ICD and BaroStim. EP has seen ICD to come out  Monday (ok to leave Baromstim as it is extravascular)  2. Septic Shock  - in setting of bacteremia and aspiration PNA - c/w abx per ID - see above - Now off pressors.   3. Acute Hypoxic Respiratory Failure - Resolved/extubated.  4. Chronic Systolic Heart Failure, ICM  - Echo 12/20 EF post anterior MI 30 to 35%. - Echo 6/21 EF 25-30% RV ok  - Echo 05/19/20 EF 20-25% RV mildly HK. moderate AS  Mean gradient 13 AVA 1.2 cm2 DI 0.30 - Initial RHC this admit with low filling pressures. CI marginal at 2.3 - hold eplerenone, entresto, farxiga, and carvedilol in setting of septic shock  - Co-ox 73%, off NE though BP still soft.  - Can add digoxin 0.125 today.  - CVP 10, will give Lasix 40 mg IV x 1 today again.  - concerned he will need advanced therpaies. Likely not transplant candidate given age and lung disease. Being elevated for LVAD, but currently not a candidate for implant given active infection/ bacteremia.  - Plan ICD extraction, 6 weeks IV abx. If surveillance cultures clear x 4 weeks can consider VAD implant.   5. CAD/NSTEMI  - s/p anterior STEMI 12/20. LHC showed chronically occluded RCA (with L>>R collaterals) and thrombotic occlusion of proximal LAD. Underwent PCI of LAD. - LHC this admit with stable CAD - Had NSTEMI in setting of septic shock - Continue ASA/statin.   6. Aortic Stenosis - AS is severe on  TEE.  - Likely low-flow low gradient severe AS. Mean gradient 18. DI = 0.19 VTI = 7.5 - Presence of fibroelastoma prevents TAVR    Length of Stay: [place], MD  12/03/2020, 8:28 AM  Advanced Heart Failure Team Pager [phone] (M-F; 7a - 5p)  Please contact [facility] for night-coverage after hours (5p -7a ) and weekends on [email or web address]

## 2020-12-03 NOTE — Progress Notes (Addendum)
ANTICOAGULATION CONSULT NOTE - Initial Consult  Pharmacy Consult for heparin Indication: DVT  No Known Allergies  Patient Measurements: Height: 6' (182.9 cm) Weight: 84.3 kg (185 lb 13.6 oz) IBW/kg (Calculated) : 77.6 Heparin Dosing Weight: TBW   Vital Signs: Temp: 98 F (36.7 C) (07/24 0731) Temp Source: Oral (07/24 0731) BP: 90/65 (07/24 0600) Pulse Rate: 109 (07/24 0430)  Labs: Recent Labs    12/01/20 0236 12/02/20 0350 12/02/20 0356 12/03/20 0450  HGB 9.7* 9.3*  --  8.9*  HCT 29.2* 27.9*  --  27.3*  PLT 213 180  --  147*  CREATININE 0.95  --  0.82 0.77    Estimated Creatinine Clearance: 98.3 mL/min (by C-G formula based on SCr of 0.77 mg/dL).   Medical History: Past Medical History:  Diagnosis Date   AICD (automatic cardioverter/defibrillator) present 08/31/2019   Ankylosing spondylitis ([place])    Arthritis    BENIGN PROSTATIC HYPERTROPHY 06/07/2008   CHF (congestive heart failure) ([place])    COLONIC POLYPS, HX OF 06/07/2008   Depression    H/O hiatal hernia    Heart failure ([place])    HYPERLIPIDEMIA 06/07/2008   HYPERTENSION 06/07/2008   Myocardial infarction [facility]) 2005   NSTEMI, s/p LAD stent   NEPHROLITHIASIS, HX OF 06/07/2008   STEMI (ST elevation myocardial infarction) ([place]) 04/27/2019    Medications:  Infusions:   sodium chloride     sodium chloride     sodium chloride Stopped (11/30/20 0631)   sodium chloride      ceFAZolin (ANCEF) IV     cefTRIAXone (ROCEPHIN)  IV Stopped (12/02/20 1706)   dexmedetomidine (PRECEDEX) IV infusion Stopped (11/30/20 0037)   epinephrine Stopped (11/30/20 0220)   fentaNYL infusion INTRAVENOUS Stopped (12/02/20 1043)   norepinephrine (LEVOPHED) Adult infusion 2 mcg/min (12/02/20 1800)   vasopressin Stopped (12/01/20 2158)    Assessment: 65 yo M with suspected ICD infection. H/x of CAD s/p previous MI, HTN, HL, ST jude ICD, S/P Barostim, and chronic systolic heart failure, EF 20-25  Recent ultrasound positive for  DVT Hgb and plts slowly down trending, will watch 11.2>>9.7>>9.3>>8.9 Plts 180>> 147  Has been on LMWH DVT ppx.   Goal of Therapy:  Heparin level 0.3-0.7 units/ml Monitor platelets by anticoagulation protocol: Yes   Plan:  Give 6000 units bolus x 1 Start heparin infusion at 1400 units/hr Check anti-Xa level in 6 hours and daily while on heparin Continue to monitor H&H and platelets  Anderson [place] A Yamil Oelke 12/03/2020,11:15 AM  Last dose of LMWH at 0900 today, so will decrease bolus by 50% to 3000 units.

## 2020-12-03 NOTE — Progress Notes (Signed)
[facility]   Reason for visit: Follow up on bacteremia  Interval History: now extubated, up in a chair, WBC stable at 12.7, remains afebrile now.  Day 5 ceftriaxone Blood cultures 11/30/20 remain ngtd                         11/29/20 Strep gordonii  Physical Exam: Constitutional:  Vitals:   12/03/20 0600 12/03/20 0731  BP: 90/65   Pulse:    Resp: (!) 31   Temp:  98 F (36.7 C)  SpO2: 93%    patient appears in NAD, up in chair, conversant Respiratory: Normal respiratory effort; CTA B Cardiovascular: RRR GI: soft, nt, nd  Review of Systems: Constitutional: negative for fevers and chills Gastrointestinal: negative for nausea and diarrhea Integument/breast: negative for rash  Lab Results  Component Value Date   WBC 12.7 (H) 12/03/2020   HGB 8.9 (L) 12/03/2020   HCT 27.3 (L) 12/03/2020   MCV 87.5 12/03/2020   PLT 147 (L) 12/03/2020    Lab Results  Component Value Date   CREATININE 0.77 12/03/2020   BUN 21 12/03/2020   NA 135 12/03/2020   K 3.4 (L) 12/03/2020   CL 104 12/03/2020   CO2 24 12/03/2020    Lab Results  Component Value Date   ALT 70 (H) 11/30/2020   AST 63 (H) 11/30/2020   ALKPHOS 66 11/30/2020     Microbiology: Recent Results (from the past 240 hour(s))  Culture, blood (routine x 2)     Status: Abnormal   Collection Time: 11/29/20 12:54 PM   Specimen: BLOOD  Result Value Ref Range Status   Specimen Description BLOOD SITE NOT SPECIFIED  Final   Special Requests   Final    BOTTLES DRAWN AEROBIC AND ANAEROBIC Blood Culture adequate volume   Culture  Setup Time   Final    GRAM POSITIVE COCCI IN CHAINS IN BOTH AEROBIC AND ANAEROBIC BOTTLES CRITICAL VALUE NOTED.  VALUE IS CONSISTENT WITH PREVIOUSLY REPORTED AND CALLED VALUE.    Culture (A)  Final    STREPTOCOCCUS GORDONII SUSCEPTIBILITIES PERFORMED ON PREVIOUS CULTURE WITHIN THE LAST 5 DAYS. Performed at [facility], [place] [address], [place], [place]  [address]    Report Status 12/02/2020 FINAL  Final  Culture, blood (routine x 2)     Status: Abnormal   Collection Time: 11/29/20  1:04 PM   Specimen: BLOOD RIGHT HAND  Result Value Ref Range Status   Specimen Description BLOOD RIGHT HAND  Final   Special Requests   Final    BOTTLES DRAWN AEROBIC AND ANAEROBIC Blood Culture results may not be optimal due to an excessive volume of blood received in culture bottles   Culture  Setup Time   Final    GRAM POSITIVE COCCI IN CHAINS IN BOTH AEROBIC AND ANAEROBIC BOTTLES CRITICAL RESULT CALLED TO, READ BACK BY AND VERIFIED WITH: C. AMEND,PHARMD [id number] 11/30/2020 T. TYSOR Performed at [facility], [place] [address], [place], [place] [address]    Culture STREPTOCOCCUS GORDONII (A)  Final   Report Status 12/02/2020 FINAL  Final   Organism ID, Bacteria STREPTOCOCCUS GORDONII  Final      Susceptibility   Streptococcus gordonii - MIC*    PENICILLIN <=0.06 SENSITIVE Sensitive     CEFTRIAXONE <=0.12 SENSITIVE Sensitive     ERYTHROMYCIN <=0.12 SENSITIVE Sensitive     LEVOFLOXACIN 1 SENSITIVE Sensitive     VANCOMYCIN 0.5  SENSITIVE Sensitive     * STREPTOCOCCUS GORDONII  Blood Culture ID Panel (Reflexed)     Status: Abnormal   Collection Time: 11/29/20  1:04 PM  Result Value Ref Range Status   Enterococcus faecalis NOT DETECTED NOT DETECTED Final   Enterococcus Faecium NOT DETECTED NOT DETECTED Final   Listeria monocytogenes NOT DETECTED NOT DETECTED Final   Staphylococcus species NOT DETECTED NOT DETECTED Final   Staphylococcus aureus (BCID) NOT DETECTED NOT DETECTED Final   Staphylococcus epidermidis NOT DETECTED NOT DETECTED Final   Staphylococcus lugdunensis NOT DETECTED NOT DETECTED Final   Streptococcus species DETECTED (A) NOT DETECTED Final    Comment: Not Enterococcus species, Streptococcus agalactiae, Streptococcus pyogenes, or Streptococcus pneumoniae. CRITICAL RESULT CALLED TO, READ BACK BY AND VERIFIED WITH: C. AMEND,PHARMD [id number]  11/30/2020 T. TYSOR    Streptococcus agalactiae NOT DETECTED NOT DETECTED Final   Streptococcus pneumoniae NOT DETECTED NOT DETECTED Final   Streptococcus pyogenes NOT DETECTED NOT DETECTED Final   A.calcoaceticus-baumannii NOT DETECTED NOT DETECTED Final   Bacteroides fragilis NOT DETECTED NOT DETECTED Final   Enterobacterales NOT DETECTED NOT DETECTED Final   Enterobacter cloacae complex NOT DETECTED NOT DETECTED Final   Escherichia coli NOT DETECTED NOT DETECTED Final   Klebsiella aerogenes NOT DETECTED NOT DETECTED Final   Klebsiella oxytoca NOT DETECTED NOT DETECTED Final   Klebsiella pneumoniae NOT DETECTED NOT DETECTED Final   Proteus species NOT DETECTED NOT DETECTED Final   Salmonella species NOT DETECTED NOT DETECTED Final   Serratia marcescens NOT DETECTED NOT DETECTED Final   Haemophilus influenzae NOT DETECTED NOT DETECTED Final   Neisseria meningitidis NOT DETECTED NOT DETECTED Final   Pseudomonas aeruginosa NOT DETECTED NOT DETECTED Final   Stenotrophomonas maltophilia NOT DETECTED NOT DETECTED Final   Candida albicans NOT DETECTED NOT DETECTED Final   Candida auris NOT DETECTED NOT DETECTED Final   Candida glabrata NOT DETECTED NOT DETECTED Final   Candida krusei NOT DETECTED NOT DETECTED Final   Candida parapsilosis NOT DETECTED NOT DETECTED Final   Candida tropicalis NOT DETECTED NOT DETECTED Final   Cryptococcus neoformans/gattii NOT DETECTED NOT DETECTED Final    Comment: Performed at [facility], [address] [place], [place] [address]  [name] Next Gen by PCR, Nasal     Status: None   Collection Time: 11/29/20  2:01 PM  Result Value Ref Range Status   [name] by PCR Next Gen NOT DETECTED NOT DETECTED Final    Comment: (NOTE) The GeneXpert [name] Assay (FDA approved for NASAL specimens only), is one component of a comprehensive [name] colonization surveillance program. It is not intended to diagnose [name] infection nor to guide or monitor treatment for [name]  infections. Test performance is not FDA approved in patients less than 75 years old. Performed at [facility], [place] [address] [place], [place] [address]   Culture, blood (routine x 2)     Status: None (Preliminary result)   Collection Time: 11/30/20  8:14 AM   Specimen: BLOOD RIGHT HAND  Result Value Ref Range Status   Specimen Description BLOOD RIGHT HAND  Final   Special Requests   Final    BOTTLES DRAWN AEROBIC ONLY Blood Culture adequate volume   Culture   Final    NO GROWTH 3 DAYS Performed at [facility], [place] [address], [place], [place] [address]    Report Status PENDING  Incomplete  Culture, blood (routine x 2)     Status: None (Preliminary result)  Collection Time: 11/30/20  8:20 AM   Specimen: BLOOD LEFT HAND  Result Value Ref Range Status   Specimen Description BLOOD LEFT HAND  Final   Special Requests   Final    BOTTLES DRAWN AEROBIC ONLY Blood Culture results may not be optimal due to an inadequate volume of blood received in culture bottles   Culture   Final    NO GROWTH 3 DAYS Performed at [facility], [place] [address], [place], [place] [address]    Report Status PENDING  Incomplete    Impression/Plan:  1. Strep gordonii bacteremia - repeat cultures remain negative, no new culture growth from previous cultures either.  Improving overall and on appropriate therapy with ceftriaxone.  No changes.  2.  ICD - possible infection and plan for removal tomorrow by EP and continue ceftriaxone for 6 weeks.    Dr. [name] back tomorrow

## 2020-12-03 NOTE — Progress Notes (Signed)
NAME:  Brandon [name], MRN:  [id number], DOB:  10/31/1955, LOS: 4 ADMISSION DATE:  11/29/2020, CONSULTATION DATE:  12/03/20 REFERRING MD:  cardiology, CHIEF COMPLAINT:  respiratory failure   History of Present Illness:  65 y.o. M with PMH of HFrEF, CAD s/p previous MI with [facility] Jude ICD and barrel stem, hypertension, hyperlipidemia also under TAVR consideration who presented for scheduled right and left heart cath on 7/20, which showed stable two-vessel CAD , EF 20% and heavily calcified aortic valve.   After his procedure, he developed chills, nausea, vomiting and shortness of breath.  He reported nausea and vomiting for the last several weeks.  His temperature climbed to 102F and oxygen saturations dropped into the 70%'s along with sinus tachycardia.  He was admitted and placed on nonrebreather along with vancomycin and ceftriaxone, overnight he developed progressive respiratory distress and tachycardia, PCCM consulted and patient ultimately required intubation and vasopressor support  Pertinent  Medical History   has a past medical history of AICD (automatic cardioverter/defibrillator) present (08/31/2019), Ankylosing spondylitis ([place]), Arthritis, BENIGN PROSTATIC HYPERTROPHY (06/07/2008), CHF (congestive heart failure) ([place]), COLONIC POLYPS, HX OF (06/07/2008), Depression, H/O hiatal hernia, Heart failure ([place]), HYPERLIPIDEMIA (06/07/2008), HYPERTENSION (06/07/2008), Myocardial infarction ([place]) (2005), NEPHROLITHIASIS, HX OF (06/07/2008), and STEMI (ST elevation myocardial infarction) ([place]) (04/27/2019).  Significant Hospital Events: Including procedures, antibiotic start and stop dates in addition to other pertinent events   7/20 underwent right and left heart cath, postop decompensation and admission.  Vancomycin and ceftriaxone initiated 7/21 respiratory distress overnight along with shock, PCCM consulted and patient intubated along with placement of central line and arterial line 7/23  extubated  Interim History / Subjective:  No fevers CBC pending  Objective   Blood pressure 90/65, pulse (!) 109, temperature 99.2 F (37.3 C), temperature source Oral, resp. rate (!) 31, height 6' (1.829 m), weight 84.3 kg, SpO2 93 %. CVP:  [10 mmHg-23 mmHg] 11 mmHg  Vent Mode: PSV;CPAP FiO2 (%):  [28 %-30 %] 28 % Set Rate:  [24 bmp] 24 bmp Vt Set:  [620 mL] 620 mL PEEP:  [5 cmH20-8 cmH20] 5 cmH20 Pressure Support:  [8 cmH20] 8 cmH20 Plateau Pressure:  [18 cmH20-22 cmH20] 18 cmH20   Intake/Output Summary (Last 24 hours) at 12/03/2020 0716 Last data filed at 12/03/2020 0200 Gross per 24 hour  Intake 817.68 ml  Output 1870 ml  Net -1052.32 ml    Filed Weights   12/01/20 0500 12/02/20 0500 12/03/20 0500  Weight: 94 kg 88 kg 84.3 kg   No distress Ongoing swelling in R arm and pain, good pulses, some trouble with coordination DOE and weakness but able to walk to bathroom Lungs with crackles Trace LE edema Good insight  Resolved Hospital Problem list     Assessment & Plan:  Strep bacteremia with likely seeding of ICD Poor dentition could be source fo above Acute hypoxemic respiratory failure with abnormal CXR- combination edema and aspiration Baseline severe cardiomyopathy- in workup for LVAD, 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

## 2020-12-03 NOTE — Progress Notes (Signed)
ANTICOAGULATION CONSULT NOTE  Pharmacy Consult for heparin Indication: DVT  No Known Allergies  Patient Measurements: Height: 6' (182.9 cm) Weight: 84.3 kg (185 lb 13.6 oz) IBW/kg (Calculated) : 77.6 Heparin Dosing Weight: TBW   Vital Signs: Temp: 98.3 F (36.8 C) (07/24 1555) Temp Source: Oral (07/24 1555) BP: 93/71 (07/24 1600)  Labs: Recent Labs    12/01/20 0236 12/02/20 0350 12/02/20 0356 12/03/20 0450 12/03/20 1616  HGB 9.7* 9.3*  --  8.9*  --   HCT 29.2* 27.9*  --  27.3*  --   PLT 213 180  --  147*  --   HEPARINUNFRC  --   --   --   --  0.29*  CREATININE 0.95  --  0.82 0.77  --      Estimated Creatinine Clearance: 98.3 mL/min (by C-G formula based on SCr of 0.77 mg/dL).   Medical History: Past Medical History:  Diagnosis Date   AICD (automatic cardioverter/defibrillator) present 08/31/2019   Ankylosing spondylitis ([place])    Arthritis    BENIGN PROSTATIC HYPERTROPHY 06/07/2008   CHF (congestive heart failure) ([place])    COLONIC POLYPS, HX OF 06/07/2008   Depression    H/O hiatal hernia    Heart failure ([place])    HYPERLIPIDEMIA 06/07/2008   HYPERTENSION 06/07/2008   Myocardial infarction [facility]) 2005   NSTEMI, s/p LAD stent   NEPHROLITHIASIS, HX OF 06/07/2008   STEMI (ST elevation myocardial infarction) ([place]) 04/27/2019    Medications:  Infusions:   sodium chloride     sodium chloride     sodium chloride Stopped (11/30/20 0631)   sodium chloride      ceFAZolin (ANCEF) IV     cefTRIAXone (ROCEPHIN)  IV 2 g (12/03/20 1619)   dexmedetomidine (PRECEDEX) IV infusion Stopped (11/30/20 0037)   heparin 1,400 Units/hr (12/03/20 1600)   norepinephrine (LEVOPHED) Adult infusion Stopped (12/02/20 1841)    Assessment: 65 yo M with suspected ICD infection. H/x of CAD s/p previous MI, HTN, HL, ST jude ICD, S/P Barostim, and chronic systolic heart failure, EF 20-25  Recent ultrasound positive for DVT Hgb and plts slowly down trending, will watch  11.2>>9.7>>9.3>>8.9 Plts 180>> 147  Has been on LMWH DVT ppx.   Initial heparin level slightly subtherapeutic s/p start at 1400 units/hr  Goal of Therapy:  Heparin level 0.3-0.7 units/ml Monitor platelets by anticoagulation protocol: Yes   Plan:  Increase heparin gtt to 1500 units/hr Gtt to stop at midnight for ICD extraction tomorrow AM F/u [facility] plan post-op  Bertis Ruddy, PharmD Clinical Pharmacist ED Pharmacist Phone # [phone] 12/03/2020 5:00 PM

## 2020-12-03 NOTE — Progress Notes (Signed)
VASCULAR LAB    Right upper extremity venous duplex has been performed.  See CV proc for preliminary results.  Messaged results to Dr. Tamala Julian and Janett Billow, RN  Mauro Kaufmann, [facility], RVT 12/03/2020, 11:01 AM

## 2020-12-04 ENCOUNTER — Encounter (HOSPITAL_COMMUNITY)
Admission: RE | Disposition: A | Discharge status: Home Health | Payer: Self-pay | Source: Home / Self Care | Attending: Internal Medicine

## 2020-12-04 ENCOUNTER — Inpatient Hospital Stay (HOSPITAL_COMMUNITY): Payer: Medicare Other

## 2020-12-04 DIAGNOSIS — J9601 Acute respiratory failure with hypoxia: Secondary | ICD-10-CM | POA: Diagnosis not present

## 2020-12-04 DIAGNOSIS — D649 Anemia, unspecified: Secondary | ICD-10-CM

## 2020-12-04 DIAGNOSIS — I358 Other nonrheumatic aortic valve disorders: Secondary | ICD-10-CM

## 2020-12-04 DIAGNOSIS — R570 Cardiogenic shock: Secondary | ICD-10-CM | POA: Diagnosis not present

## 2020-12-04 DIAGNOSIS — K029 Dental caries, unspecified: Secondary | ICD-10-CM

## 2020-12-04 DIAGNOSIS — T80219D Unspecified infection due to central venous catheter, subsequent encounter: Secondary | ICD-10-CM | POA: Diagnosis not present

## 2020-12-04 DIAGNOSIS — T827XXA Infection and inflammatory reaction due to other cardiac and vascular devices, implants and grafts, initial encounter: Secondary | ICD-10-CM | POA: Diagnosis not present

## 2020-12-04 HISTORY — PX: ICD LEAD REMOVAL: SHX5855

## 2020-12-04 HISTORY — PX: TEE WITHOUT CARDIOVERSION: SHX5443

## 2020-12-04 LAB — COOXEMETRY PANEL
Carboxyhemoglobin: 1.3 % (ref 0.5–1.5)
Methemoglobin: 1.1 % (ref 0.0–1.5)
O2 Saturation: 69.5 %
Total hemoglobin: 11.2 g/dL — ABNORMAL LOW (ref 12.0–16.0)

## 2020-12-04 LAB — RESP PANEL BY RT-PCR (FLU A&B, COVID) ARPGX2
Influenza A by PCR: NEGATIVE
Influenza B by PCR: NEGATIVE
SARS Coronavirus 2 by RT PCR: NEGATIVE

## 2020-12-04 LAB — PREPARE RBC (CROSSMATCH)

## 2020-12-04 LAB — GLUCOSE, CAPILLARY
Glucose-Capillary: 123 mg/dL — ABNORMAL HIGH (ref 70–99)
Glucose-Capillary: 119 mg/dL — ABNORMAL HIGH (ref 70–99)
Glucose-Capillary: 115 mg/dL — ABNORMAL HIGH (ref 70–99)
Glucose-Capillary: 118 mg/dL — ABNORMAL HIGH (ref 70–99)
Glucose-Capillary: 125 mg/dL — ABNORMAL HIGH (ref 70–99)
Glucose-Capillary: 133 mg/dL — ABNORMAL HIGH (ref 70–99)

## 2020-12-04 LAB — CBC
HCT: 25 % — ABNORMAL LOW (ref 39.0–52.0)
Hemoglobin: 8.3 g/dL — ABNORMAL LOW (ref 13.0–17.0)
MCH: 28.9 pg (ref 26.0–34.0)
MCHC: 33.2 g/dL (ref 30.0–36.0)
MCV: 87.1 fL (ref 80.0–100.0)
Platelets: 145 10*3/uL — ABNORMAL LOW (ref 150–400)
RBC: 2.87 MIL/uL — ABNORMAL LOW (ref 4.22–5.81)
RDW: 14.8 % (ref 11.5–15.5)
WBC: 9.2 10*3/uL (ref 4.0–10.5)
nRBC: 0 % (ref 0.0–0.2)

## 2020-12-04 LAB — BASIC METABOLIC PANEL
Anion gap: 6 (ref 5–15)
BUN: 23 mg/dL (ref 8–23)
CO2: 25 mmol/L (ref 22–32)
Calcium: 8.1 mg/dL — ABNORMAL LOW (ref 8.9–10.3)
Chloride: 100 mmol/L (ref 98–111)
Creatinine, Ser: 0.73 mg/dL (ref 0.61–1.24)
GFR, Estimated: >60 mL/min (ref >60)
Glucose, Bld: 127 mg/dL — ABNORMAL HIGH (ref 70–99)
Potassium: 3.8 mmol/L (ref 3.5–5.1)
Sodium: 131 mmol/L — ABNORMAL LOW (ref 135–145)

## 2020-12-04 LAB — MAGNESIUM: Magnesium: 2.1 mg/dL (ref 1.7–2.4)

## 2020-12-04 LAB — PHOSPHORUS: Phosphorus: 2.7 mg/dL (ref 2.5–4.6)

## 2020-12-04 SURGERY — REMOVAL, ELECTRODE LEAD, ICD
Anesthesia: General

## 2020-12-04 MED ORDER — NOREPINEPHRINE 4 MG/250ML-% IV SOLN
INTRAVENOUS | Status: DC | PRN
  Administered 2020-12-04: 2 ug/min via INTRAVENOUS

## 2020-12-04 MED ORDER — PROMETHAZINE HCL 25 MG/ML IJ SOLN: 6.2500 mg | INTRAMUSCULAR | Status: DC | PRN

## 2020-12-04 MED ORDER — MIDAZOLAM HCL 2 MG/2ML IJ SOLN
INTRAMUSCULAR | Status: DC | PRN
Start: 2020-12-04 — End: 2020-12-04
  Administered 2020-12-04: 1 mg via INTRAVENOUS

## 2020-12-04 MED ORDER — MIDODRINE HCL 5 MG PO TABS
5.0000 mg | ORAL_TABLET | Freq: Three times a day (TID) | ORAL | Status: DC
  Administered 2020-12-04 – 2020-12-07 (×9): 5 mg via ORAL
  Filled 2020-12-04 (×9): qty 1

## 2020-12-04 MED ORDER — DEXAMETHASONE SODIUM PHOSPHATE 10 MG/ML IJ SOLN
INTRAMUSCULAR | Status: AC
  Filled 2020-12-04: qty 1

## 2020-12-04 MED ORDER — POTASSIUM CHLORIDE CRYS ER 20 MEQ PO TBCR
20.0000 meq | EXTENDED_RELEASE_TABLET | Freq: Once | ORAL | Status: AC
  Administered 2020-12-04: 20 meq via ORAL
  Filled 2020-12-04: qty 1

## 2020-12-04 MED ORDER — SODIUM CHLORIDE 0.9 % IV SOLN: INTRAVENOUS | Status: DC | PRN

## 2020-12-04 MED ORDER — LACTATED RINGERS IV SOLN: INTRAVENOUS | Status: DC

## 2020-12-04 MED ORDER — FUROSEMIDE 40 MG PO TABS
40.0000 mg | ORAL_TABLET | Freq: Every day | ORAL | Status: DC
  Administered 2020-12-04: 40 mg via ORAL
  Filled 2020-12-04: qty 1

## 2020-12-04 MED ORDER — ONDANSETRON HCL 4 MG/2ML IJ SOLN
INTRAMUSCULAR | Status: AC
  Filled 2020-12-04: qty 2

## 2020-12-04 MED ORDER — ACETAMINOPHEN 10 MG/ML IV SOLN: 1000.0000 mg | Freq: Once | INTRAVENOUS | Status: DC | PRN

## 2020-12-04 MED ORDER — ONDANSETRON HCL 4 MG/2ML IJ SOLN
INTRAMUSCULAR | Status: DC | PRN
Start: 2020-12-04 — End: 2020-12-04
  Administered 2020-12-04: 4 mg via INTRAVENOUS

## 2020-12-04 MED ORDER — FENTANYL CITRATE (PF) 250 MCG/5ML IJ SOLN
INTRAMUSCULAR | Status: AC
  Filled 2020-12-04: qty 5

## 2020-12-04 MED ORDER — ETOMIDATE 2 MG/ML IV SOLN
INTRAVENOUS | Status: DC | PRN
  Administered 2020-12-04: 16 mg via INTRAVENOUS

## 2020-12-04 MED ORDER — ORAL CARE MOUTH RINSE: 15.0000 mL | Freq: Once | OROMUCOSAL | Status: AC

## 2020-12-04 MED ORDER — CHLORHEXIDINE GLUCONATE 0.12 % MT SOLN
OROMUCOSAL | Status: AC
  Administered 2020-12-04: 15 mL via OROMUCOSAL
  Filled 2020-12-04: qty 15

## 2020-12-04 MED ORDER — LIDOCAINE HCL (PF) 1 % IJ SOLN
INTRAMUSCULAR | Status: AC
  Filled 2020-12-04: qty 30

## 2020-12-04 MED ORDER — IODIXANOL 320 MG/ML IV SOLN: INTRAVENOUS | Status: DC | PRN

## 2020-12-04 MED ORDER — FENTANYL CITRATE (PF) 250 MCG/5ML IJ SOLN
INTRAMUSCULAR | Status: DC | PRN
  Administered 2020-12-04: 150 ug via INTRAVENOUS
  Administered 2020-12-04: 50 ug via INTRAVENOUS

## 2020-12-04 MED ORDER — SODIUM CHLORIDE 0.9 % IV SOLN
INTRAVENOUS | Status: AC
  Filled 2020-12-04: qty 2

## 2020-12-04 MED ORDER — HEPARIN 6000 UNIT IRRIGATION SOLUTION
Status: DC | PRN
  Administered 2020-12-04: 1

## 2020-12-04 MED ORDER — FUROSEMIDE 10 MG/ML IJ SOLN
60.0000 mg | Freq: Once | INTRAMUSCULAR | Status: AC
  Administered 2020-12-04: 60 mg via INTRAVENOUS
  Filled 2020-12-04: qty 6

## 2020-12-04 MED ORDER — LIDOCAINE 2% (20 MG/ML) 5 ML SYRINGE
INTRAMUSCULAR | Status: AC
  Filled 2020-12-04: qty 5

## 2020-12-04 MED ORDER — MIDAZOLAM HCL 2 MG/2ML IJ SOLN
INTRAMUSCULAR | Status: AC
  Filled 2020-12-04: qty 2

## 2020-12-04 MED ORDER — ROCURONIUM BROMIDE 10 MG/ML (PF) SYRINGE
PREFILLED_SYRINGE | INTRAVENOUS | Status: AC
  Filled 2020-12-04: qty 10

## 2020-12-04 MED ORDER — DEXAMETHASONE SODIUM PHOSPHATE 10 MG/ML IJ SOLN
INTRAMUSCULAR | Status: DC | PRN
  Administered 2020-12-04: 5 mg via INTRAVENOUS

## 2020-12-04 MED ORDER — PROPOFOL 10 MG/ML IV BOLUS
INTRAVENOUS | Status: AC
  Filled 2020-12-04: qty 20

## 2020-12-04 MED ORDER — HEPARIN 6000 UNIT IRRIGATION SOLUTION
Status: AC
  Filled 2020-12-04: qty 500

## 2020-12-04 MED ORDER — NOREPINEPHRINE 4 MG/250ML-% IV SOLN
INTRAVENOUS | Status: AC
  Filled 2020-12-04: qty 250

## 2020-12-04 MED ORDER — CHLORHEXIDINE GLUCONATE 0.12 % MT SOLN: 15.0000 mL | Freq: Once | OROMUCOSAL | Status: AC

## 2020-12-04 MED ORDER — HEPARIN (PORCINE) 25000 UT/250ML-% IV SOLN: 1500.0000 [IU]/h | INTRAVENOUS | Status: DC

## 2020-12-04 MED ORDER — LIDOCAINE 2% (20 MG/ML) 5 ML SYRINGE
INTRAMUSCULAR | Status: DC | PRN
  Administered 2020-12-04: 60 mg via INTRAVENOUS

## 2020-12-04 MED ORDER — SODIUM CHLORIDE 0.9% IV SOLUTION: Freq: Once | INTRAVENOUS | Status: DC

## 2020-12-04 MED ORDER — SUGAMMADEX SODIUM 200 MG/2ML IV SOLN
INTRAVENOUS | Status: DC | PRN
  Administered 2020-12-04: 200 mg via INTRAVENOUS

## 2020-12-04 MED ORDER — ROCURONIUM BROMIDE 10 MG/ML (PF) SYRINGE
PREFILLED_SYRINGE | INTRAVENOUS | Status: DC | PRN
  Administered 2020-12-04: 10 mg via INTRAVENOUS
  Administered 2020-12-04: 60 mg via INTRAVENOUS

## 2020-12-04 MED ORDER — FENTANYL CITRATE (PF) 100 MCG/2ML IJ SOLN: 25.0000 ug | INTRAMUSCULAR | Status: DC | PRN

## 2020-12-04 MED ORDER — VASOPRESSIN 20 UNIT/ML IV SOLN
INTRAVENOUS | Status: AC
  Filled 2020-12-04: qty 1

## 2020-12-04 MED ORDER — PHENYLEPHRINE 40 MCG/ML (10ML) SYRINGE FOR IV PUSH (FOR BLOOD PRESSURE SUPPORT)
PREFILLED_SYRINGE | INTRAVENOUS | Status: AC
  Filled 2020-12-04: qty 10

## 2020-12-04 MED ORDER — PHENYLEPHRINE HCL-NACL 10-0.9 MG/250ML-% IV SOLN
INTRAVENOUS | Status: AC
  Filled 2020-12-04: qty 250

## 2020-12-04 SURGICAL SUPPLY — 49 items
BAG BANDED W/RUBBER/TAPE 36X54 (MISCELLANEOUS) IMPLANT
BAG COUNTER SPONGE SURGICOUNT (BAG) ×2 IMPLANT
BAG DECANTER FOR FLEXI CONT (MISCELLANEOUS) IMPLANT
BLADE CLIPPER SURG (BLADE) IMPLANT
BLADE OSCILLATING /SAGITTAL (BLADE) IMPLANT
BLADE STERNUM SYSTEM 6 (BLADE) IMPLANT
BNDG COHESIVE 4X5 WHT NS (GAUZE/BANDAGES/DRESSINGS) ×2 IMPLANT
CANISTER SUCT 3000ML PPV (MISCELLANEOUS) ×2 IMPLANT
CATH DIAG EXPO 6F FR4 (CATHETERS) ×2 IMPLANT
CLSR STERI-STRIP ANTIMIC 1/2X4 (GAUZE/BANDAGES/DRESSINGS) ×2 IMPLANT
COVER BACK TABLE 60X90IN (DRAPES) ×2 IMPLANT
COVER PROBE W GEL 5X96 (DRAPES) ×2 IMPLANT
COVER SURGICAL LIGHT HANDLE (MISCELLANEOUS) ×2 IMPLANT
DRAPE CARDIOVASCULAR INCISE (DRAPES) ×2
DRAPE HALF SHEET 40X57 (DRAPES) ×6 IMPLANT
DRAPE INCISE IOBAN 66X45 STRL (DRAPES) IMPLANT
DRAPE SRG 135X102X78XABS (DRAPES) ×1 IMPLANT
DRSG TEGADERM 4X4.75 (GAUZE/BANDAGES/DRESSINGS) ×4 IMPLANT
ELECT REM PT RETURN 9FT ADLT (ELECTROSURGICAL) ×4
ELECTRODE REM PT RTRN 9FT ADLT (ELECTROSURGICAL) ×2 IMPLANT
FELT TEFLON 1X6 (MISCELLANEOUS) IMPLANT
GAUZE 4X4 16PLY ~~LOC~~+RFID DBL (SPONGE) IMPLANT
GAUZE SPONGE 4X4 12PLY STRL (GAUZE/BANDAGES/DRESSINGS) ×2 IMPLANT
GAUZE SPONGE 4X4 12PLY STRL LF (GAUZE/BANDAGES/DRESSINGS) ×2 IMPLANT
GLOVE SURG LTX SZ8 (GLOVE) ×2 IMPLANT
GLOVE SURG UNDER POLY LF SZ6.5 (GLOVE) ×2 IMPLANT
GLOVE SURG UNDER POLY LF SZ7.5 (GLOVE) ×2 IMPLANT
GOWN STRL REUS W/ TWL LRG LVL3 (GOWN DISPOSABLE) ×1 IMPLANT
GOWN STRL REUS W/ TWL XL LVL3 (GOWN DISPOSABLE) ×1 IMPLANT
GOWN STRL REUS W/TWL LRG LVL3 (GOWN DISPOSABLE) ×2
GOWN STRL REUS W/TWL XL LVL3 (GOWN DISPOSABLE) ×2
KIT TURNOVER KIT B (KITS) ×2 IMPLANT
NEEDLE PERC 18GX7CM (NEEDLE) ×2 IMPLANT
NS IRRIG 1000ML POUR BTL (IV SOLUTION) IMPLANT
PAD ARMBOARD 7.5X6 YLW CONV (MISCELLANEOUS) ×4 IMPLANT
PAD ELECT DEFIB RADIOL ZOLL (MISCELLANEOUS) ×2 IMPLANT
SHEATH PINNACLE 8F 10CM (SHEATH) ×2 IMPLANT
SPONGE GAUZE 2X2 8PLY STRL LF (GAUZE/BANDAGES/DRESSINGS) ×4 IMPLANT
SUT PROLENE 2 0 CT2 30 (SUTURE) IMPLANT
SUT PROLENE 2 0 MH 48 (SUTURE) ×2 IMPLANT
SUT PROLENE 2 0 SH DA (SUTURE) IMPLANT
SUT VIC AB 2-0 CT2 18 VCP726D (SUTURE) IMPLANT
SUT VIC AB 3-0 X1 27 (SUTURE) IMPLANT
TAPE CLOTH SURG 4X10 WHT LF (GAUZE/BANDAGES/DRESSINGS) ×2 IMPLANT
TOWEL GREEN STERILE (TOWEL DISPOSABLE) ×4 IMPLANT
TOWEL GREEN STERILE FF (TOWEL DISPOSABLE) ×4 IMPLANT
TRAY FOLEY MTR SLVR 16FR STAT (SET/KITS/TRAYS/PACK) ×2 IMPLANT
TUBE CONNECTING 12X1/4 (SUCTIONS) IMPLANT
YANKAUER SUCT BULB TIP NO VENT (SUCTIONS) IMPLANT

## 2020-12-04 NOTE — Anesthesia Procedure Notes (Signed)
Procedure Name: Intubation Date/Time: 12/04/2020 2:39 PM Performed by: Inda Coke, CRNA Pre-anesthesia Checklist: Patient identified, Emergency Drugs available, Suction available and Patient being monitored Patient Re-evaluated:Patient Re-evaluated prior to induction Oxygen Delivery Method: Circle System Utilized Preoxygenation: Pre-oxygenation with 100% oxygen Induction Type: IV induction Ventilation: Mask ventilation without difficulty Laryngoscope Size: Mac and 4 Grade View: Grade I Tube type: Oral Tube size: 7.5 mm Number of attempts: 1 Airway Equipment and Method: Stylet and Oral airway Placement Confirmation: ETT inserted through vocal cords under direct vision, positive ETCO2 and breath sounds checked- equal and bilateral Secured at: 24 cm Tube secured with: Tape Dental Injury: Teeth and Oropharynx as per pre-operative assessment  Comments: Placed by Rexford Maus, SRNA

## 2020-12-04 NOTE — Transfer of Care (Signed)
Immediate Anesthesia Transfer of Care Note  Patient: Brandon [name]  Procedure(s) Performed: ICD SYSTEM EXTRACTION TRANSESOPHAGEAL ECHOCARDIOGRAM (TEE)  Patient Location: PACU  Anesthesia Type:General  Level of Consciousness: awake and alert   Airway & Oxygen Therapy: Patient Spontanous Breathing  Post-op Assessment: Report given to RN and Post -op Vital signs reviewed and stable  Post vital signs: Reviewed and stable  Last Vitals:  Vitals Value Taken Time  BP 102/63 12/04/20 1624  Temp    Pulse 117 12/04/20 1629  Resp 31 12/04/20 1629  SpO2 92 % 12/04/20 1629  Vitals shown include unvalidated device data.  Last Pain:  Vitals:   12/04/20 1310  TempSrc:   PainSc: 0-No pain      Patients Stated Pain Goal: 6 (79/81/02 5486)  Complications: No notable events documented.

## 2020-12-04 NOTE — Progress Notes (Signed)
Progress Note  Patient Name: Brandon [name] Date of Encounter: 12/04/2020  [facility] Cardiologist: [name], MD   Subjective   Feeling well, no CP, not SOB  Inpatient Medications    Scheduled Meds:  atorvastatin  80 mg Oral Daily   chlorhexidine  60 mL Topical Once   Chlorhexidine Gluconate Cloth  6 each Topical Q0600   dexamethasone (DECADRON) injection  4 mg Intravenous Q12H   digoxin  0.125 mg Oral Daily   docusate sodium  100 mg Oral BID   gabapentin  100 mg Oral TID   insulin aspart  0-15 Units Subcutaneous Q4H   insulin aspart  0-15 Units Subcutaneous TID WC   insulin aspart  0-5 Units Subcutaneous QHS   melatonin  5 mg Oral QHS   mupirocin ointment  1 application Nasal BID   pantoprazole  40 mg Oral Daily   polyethylene glycol  17 g Oral Daily   sodium chloride flush  3 mL Intravenous Q12H   sodium chloride flush  3 mL Intravenous Q12H   Continuous Infusions:  sodium chloride     sodium chloride     sodium chloride Stopped (11/30/20 0631)   sodium chloride     cefTRIAXone (ROCEPHIN)  IV 2 g (12/03/20 1619)   dexmedetomidine (PRECEDEX) IV infusion Stopped (11/30/20 0037)   norepinephrine (LEVOPHED) Adult infusion Stopped (12/02/20 1841)   PRN Meds: sodium chloride, sodium chloride, acetaminophen (TYLENOL) oral liquid 160 mg/5 mL, acetaminophen, fentaNYL (SUBLIMAZE) injection, fentaNYL (SUBLIMAZE) injection, ondansetron (ZOFRAN) IV, oxyCODONE, sodium chloride flush, sodium chloride flush, traZODone   Vital Signs    Vitals:   12/04/20 0300 12/04/20 0400 12/04/20 0500 12/04/20 0600  BP: 91/67 (!) 85/61 (!) 83/66 (!) 86/63  Pulse:  81    Resp: (!) 22 15 20 14   Temp:  98.6 F (37 C)    TempSrc:  Oral    SpO2: 91% 94% 91% 96%  Weight:    84.6 kg  Height:        Intake/Output Summary (Last 24 hours) at 12/04/2020 0718 Last data filed at 12/04/2020 0600 Gross per 24 hour  Intake 1226.64 ml  Output 1590 ml  Net -363.36 ml   Last 3 Weights  12/04/2020 12/03/2020 12/03/2020  Weight (lbs) 186 lb 8.2 oz 185 lb 13.6 oz 185 lb 13.6 oz  Weight (kg) 84.6 kg 84.3 kg 84.3 kg      Telemetry    SR  70's, brief episode tachy/BBB, was irregular and quite short (suspect brief AF/RVR) - Personally Reviewed  ECG    No new EKGs - Personally Reviewed  Physical Exam   GEN: No acute distress.   Neck: No JVD Cardiac: RRR, no murmurs, rubs, or gallops.  Respiratory: CTA b/l. GI: Soft, nontender, non-distended  MS: trace LE edema L>R, RUE edema, No deformity. Neuro:  Nonfocal  Psych: Normal affect   Labs    High Sensitivity Troponin:   Recent Labs  Lab 11/30/20 0144 11/30/20 0522  TROPONINIHS 3,550* 4,843*      Chemistry Recent Labs  Lab 11/29/20 1310 11/29/20 2221 11/30/20 0833 11/30/20 0839 12/02/20 0356 12/03/20 0450 12/04/20 0352  NA 133*   < >  --    < > 135 135 131*  K 2.9*   < >  --    < > 3.5 3.4* 3.8  CL 97*   < >  --    < > 104 104 100  CO2 24   < >  --    < >  21* 24 25  GLUCOSE 111*   < >  --    < > 128* 93 127*  BUN 11   < >  --    < > 25* 21 23  CREATININE 0.89   < >  --    < > 0.82 0.77 0.73  CALCIUM 8.7*   < >  --    < > 7.8* 8.0* 8.1*  PROT 7.4  --  6.3*  --   --   --   --   ALBUMIN 3.0*  --  2.3*  --   --   --   --   AST 36  --  63*  --   --   --   --   ALT 56*  --  70*  --   --   --   --   ALKPHOS 71  --  66  --   --   --   --   BILITOT 1.1  --  1.3*  --   --   --   --   GFRNONAA >60   < >  --    < > >60 >60 >60  ANIONGAP 12   < >  --    < > 10 7 6    < > = values in this interval not displayed.     Hematology Recent Labs  Lab 12/02/20 0350 12/03/20 0450 12/04/20 0352  WBC 15.5* 12.7* 9.2  RBC 3.20* 3.12* 2.87*  HGB 9.3* 8.9* 8.3*  HCT 27.9* 27.3* 25.0*  MCV 87.2 87.5 87.1  MCH 29.1 28.5 28.9  MCHC 33.3 32.6 33.2  RDW 14.8 14.8 14.8  PLT 180 147* 145*    BNP Recent Labs  Lab 11/29/20 2240 11/30/20 0144  BNP 1,586.1* 2,653.1*     DDimer No results for input(s): DDIMER in the  last 168 hours.   Radiology    VAS [place] UPPER EXTREMITY VENOUS DUPLEX  Result Date: 12/03/2020 UPPER VENOUS STUDY  Patient Name:  Brandon [name]  Date of Exam:   12/03/2020 Medical Rec #: [id number]       Accession #:    [id number] Date of Birth: June 24, 1955        Patient Gender: M Patient Age:   65Y Exam Location:  [facility] Procedure:      VAS [place] UPPER EXTREMITY VENOUS DUPLEX Referring Phys: [id number] Candee Furbish --------------------------------------------------------------------------------  Indications: Swelling, and Pain Limitations: Patient in chair. Comparison Study: No prior study Performing Technologist: Sharion Dove RVS  Examination Guidelines: A complete evaluation includes B-mode imaging, spectral Doppler, color Doppler, and power Doppler as needed of all accessible portions of each vessel. Bilateral testing is considered an integral part of a complete examination. Limited examinations for reoccurring indications may be performed as noted.  Right Findings: +----------+------------+---------+-----------+----------+---------------+ RIGHT     CompressiblePhasicitySpontaneousProperties    Summary     +----------+------------+---------+-----------+----------+---------------+ IJV           Full       Yes       Yes                              +----------+------------+---------+-----------+----------+---------------+ Subclavian               Yes       Yes                              +----------+------------+---------+-----------+----------+---------------+  Axillary      Full       Yes       Yes                              +----------+------------+---------+-----------+----------+---------------+ Brachial      None                                       Acute      +----------+------------+---------+-----------+----------+---------------+ Radial        Full                                                   +----------+------------+---------+-----------+----------+---------------+ Ulnar                                               patent by color +----------+------------+---------+-----------+----------+---------------+ Cephalic      None                                       Acute      +----------+------------+---------+-----------+----------+---------------+ Basilic     Partial                                      Acute      +----------+------------+---------+-----------+----------+---------------+  Left Findings: +----------+------------+---------+-----------+----------+-------+ LEFT      CompressiblePhasicitySpontaneousPropertiesSummary +----------+------------+---------+-----------+----------+-------+ Subclavian               Yes       Yes                      +----------+------------+---------+-----------+----------+-------+  Summary:  Right: Findings consistent with acute deep vein thrombosis involving the right brachial veins. Findings consistent with acute superficial vein thrombosis involving the right basilic vein and right cephalic vein.  *See table(s) above for measurements and observations.    Preliminary     Cardiac Studies    11/30/20: TEE IMPRESSIONS   1. Left ventricular ejection fraction, by estimation, is 20%. The left  ventricle has severely decreased function. The left ventricle demonstrates  global hypokinesis. The left ventricular internal cavity size was mildly  dilated.   2. No vegetation on ICD lead. Right ventricular systolic function is  mildly reduced. The right ventricular size is normal.   3. Left atrial size was severely dilated. No left atrial/left atrial  appendage thrombus was detected.   4. Right atrial size was mildly dilated.   5. The mitral valve is normal in structure. Mild mitral valve  regurgitation.   6. Aortic valve is severely calcified with markedly decresed cusp  excursion. Likely low-flow low gradient AS with mean  gradient69mm HG. VR =  0.19. VTI = 7.5. There is a nodular density on the aortic side of the  valve on the [place] that appears stable from  recent CT. Suspect fibroelastoma but given clincal scenario cannot exclude  vegetation      . The  aortic valve is tricuspid. There is severe calcifcation of the  aortic valve. Aortic valve regurgitation is mild. Severe aortic valve  stenosis.   7. There is Severe (Grade IV) plaque involving the descending aorta.   Patient Profile     65 y.o. male w/PMHx of CAD, ICM, chronic CHF (systolic), ICD, barostim, HTN, HLD admitted 7/20 for outpatient [facility] as part of VAD w/u. Post procedure, developed severe rigors, n/v and fever to 102 then developed acute hypoxic respiratory failure, requiring intubation and pressors(mixed cardiogenic/septic shock)  Blood cx + for Strep gordonii - Repeat cx 7/21 NGTD  7/22 CT C/A/P notable for aspiration PNA. No evidence septic emboli Extubated 7/23 Off pressors  12/03/20: NEW acute DVT RUE  Device SJM single chamber ICD [place], [place] 7122 lead System implanted 4/120/2021  Assessment & Plan    Bacteremia Strep species Afebrile WBC 9.2 H/H 8.3/25 Plts 145 Creat 0.73  New RUE DVT (and superficial thromb as well) Started on heparin gtt yesterday (off at [facility]) 2/2 scheduled extraction procedure  Started on Decadron unclear why.  Dr. Quentin Ore has discussed with D. Aundra Dubin, will plan to proceed today with device system extraction Dr. Quentin Ore has seen and examined the patient this morning, rediscussed rational for device removal and the procedure, potential risks/benefits. The patient is agreeable  For questions or updates, please contact [place] Please consult [email or web address] for contact info under        Signed, Baldwin [place], PA-C  12/04/2020, 7:18 AM

## 2020-12-04 NOTE — Progress Notes (Signed)
[employer] notified of procedure today.  Per receptionist, rep was paged and expected to contact us in the next hour.

## 2020-12-04 NOTE — Progress Notes (Signed)
Asked by bedside nurse to re-evaluate Mr. Provencher this evening.  O2 requirements increased 2L --> 6L postprocedurally.  On my evaluation, respirations to mid 20s, O2 93%, and dyspneic with conversation.  Overall looks stable.  CVP elevated to 12-13 lying flat with some accessory muscle use.  Crackles bibasilar.  CXR reviewed with cephalization and pulmonary venous congestion.  Will start some IV diuretics to get him better compensated after all of his resuscitation.  - Furosemide 60 mg IV x1.  Re-evaluate response.  May need a day or so of IV diuresis.  Delton Prairie, MD Cardiology Fellow [facility]

## 2020-12-04 NOTE — Progress Notes (Addendum)
Patient ID: Brandon [name], male   DOB: 08/09/1955, 65 y.o.   MRN: [id number]     Advanced Heart Failure Rounding Note  PCP-Cardiologist: [name], MD  [facility]: Dr. 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 Post procedure, developed severe rigors, n/v and fever to 102 then developed acute hypoxic respiratory failure, requiring intubation - 7/21 Blood Cx + for Strep. 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 Mean gradient 18. VR = 0.19 VTI = 7.5. RV mild HK - 7/22 CT C/A/P notable for aspiration PNA. No evidence septic emboli - Extubated 7/23 - Rt UE DVT 7/24  Subjective:    BCx + for strep gordonii. F/u cx NGTD. He remains on ceftriaxone.   WBC down 15.5>>12.7>>9.2. AF  Off pressors/inotropes. Co-ox 70%.   CVP 7   Feels ok this morning. No complaints. Going for ICD extraction today. Heparin gtt on hold.   Objective:   Weight Range: 84.6 kg Body mass index is 25.3 kg/m.   Vital Signs:   Temp:  [97.6 F (36.4 C)-98.6 F (37 C)] 98.6 F (37 C) (07/25 0400) Pulse Rate:  [81] 81 (07/25 0400) Resp:  [14-29] 14 (07/25 0600) BP: (77-102)/(59-78) 86/63 (07/25 0600) SpO2:  [82 %-99 %] 96 % (07/25 0600) Weight:  [84.3 kg-84.6 kg] 84.6 kg (07/25 0600) Last BM Date: 12/03/20  Weight change: Filed Weights   12/03/20 0500 12/03/20 1100 12/04/20 0600  Weight: 84.3 kg 84.3 kg 84.6 kg    Intake/Output:   Intake/Output Summary (Last 24 hours) at 12/04/2020 0800 Last data filed at 12/04/2020 0600 Gross per 24 hour  Intake 986.64 ml  Output 1590 ml  Net -603.36 ml      Physical Exam   CVP 7   General:  Well appearing. No respiratory difficulty HEENT: normal Neck: supple. JVD 7 cm. Carotids 2+ bilat; no bruits. No lymphadenopathy or thyromegaly appreciated. Cor: PMI nondisplaced. Regular rate & rhythm. 3/6 SM RUSB  Lungs: clear Abdomen: soft, nontender, nondistended. No hepatosplenomegaly. No bruits or masses. Good bowel sounds. Extremities: no cyanosis, clubbing, rash, no LEE, + RUE swelling  Neuro: alert & oriented x 3, cranial nerves grossly intact. moves all 4 extremities w/o difficulty. Affect pleasant.   Telemetry   NSR 90s. Personally reviewed   Labs    CBC Recent Labs    12/03/20 0450 12/04/20 0352  WBC 12.7* 9.2  HGB 8.9* 8.3*  HCT 27.3* 25.0*  MCV 87.5 87.1  PLT 147* 631*   Basic Metabolic Panel Recent Labs    12/03/20 0450 12/04/20 0352  NA 135 131*  K 3.4* 3.8  CL 104 100  CO2 24 25  GLUCOSE 93 127*  BUN 21 23  CREATININE 0.77 0.73  CALCIUM 8.0* 8.1*  MG 2.1 2.1  PHOS 2.0* 2.7   Liver Function Tests No results for input(s): AST, ALT, ALKPHOS, BILITOT, PROT, ALBUMIN in the last 72 hours.  No results for input(s): LIPASE, AMYLASE in the last 72 hours. Cardiac Enzymes No results for input(s): CKTOTAL, CKMB, CKMBINDEX, TROPONINI in the last 72 hours.  BNP: BNP (last 3 results)  Recent Labs    11/01/20 1224 11/29/20 2240 11/30/20 0144  BNP 462.1* 1,586.1* 2,653.1*    ProBNP (last 3 results) Recent Labs    03/23/20 0922  PROBNP 520*     D-Dimer No results for input(s): DDIMER in the last 72 hours. Hemoglobin A1C No results for input(s): HGBA1C in the last 72 hours.  Fasting Lipid Panel No results for input(s): CHOL, HDL, LDLCALC, TRIG, CHOLHDL, LDLDIRECT in the last 72 hours. Thyroid Function Tests No results for input(s): TSH, T4TOTAL, T3FREE, THYROIDAB in the last 72 hours.  Invalid input(s): FREET3  Other results:   Imaging    VAS [place] UPPER EXTREMITY VENOUS DUPLEX  Result Date: 12/03/2020 UPPER VENOUS STUDY   Patient Name:  Brandon [name]  Date of Exam:   12/03/2020 Medical Rec #: [id number]       Accession #:    [id number] Date of Birth: [birth year]        Patient Gender: M Patient Age:   65Y Exam Location:  [facility] Procedure:      VAS [place] UPPER EXTREMITY VENOUS DUPLEX Referring Phys: [id number] Candee Furbish --------------------------------------------------------------------------------  Indications: Swelling, and Pain Limitations: Patient in chair. Comparison Study: No prior study Performing Technologist: Sharion Dove RVS  Examination Guidelines: A complete evaluation includes B-mode imaging, spectral Doppler, color Doppler, and power Doppler as needed of all accessible portions of each vessel. Bilateral testing is considered an integral part of a complete examination. Limited examinations for reoccurring indications may be performed as noted.  Right Findings: +----------+------------+---------+-----------+----------+---------------+ RIGHT     CompressiblePhasicitySpontaneousProperties    Summary     +----------+------------+---------+-----------+----------+---------------+ IJV           Full       Yes       Yes                              +----------+------------+---------+-----------+----------+---------------+ Subclavian               Yes       Yes                              +----------+------------+---------+-----------+----------+---------------+ Axillary      Full       Yes       Yes                              +----------+------------+---------+-----------+----------+---------------+ Brachial      None                                       Acute      +----------+------------+---------+-----------+----------+---------------+ Radial        Full                                                  +----------+------------+---------+-----------+----------+---------------+ Ulnar                                               patent by  color  +----------+------------+---------+-----------+----------+---------------+ Cephalic      None                                       Acute      +----------+------------+---------+-----------+----------+---------------+ Basilic     Partial                                      Acute      +----------+------------+---------+-----------+----------+---------------+  Left Findings: +----------+------------+---------+-----------+----------+-------+ LEFT      CompressiblePhasicitySpontaneousPropertiesSummary +----------+------------+---------+-----------+----------+-------+ Subclavian               Yes       Yes                      +----------+------------+---------+-----------+----------+-------+  Summary:  Right: Findings consistent with acute deep vein thrombosis involving the right brachial veins. Findings consistent with acute superficial vein thrombosis involving the right basilic vein and right cephalic vein.  *See table(s) above for measurements and observations.    Preliminary      Medications:     Scheduled Medications:  atorvastatin  80 mg Oral Daily   chlorhexidine  60 mL Topical Once   Chlorhexidine Gluconate Cloth  6 each Topical Q0600   digoxin  0.125 mg Oral Daily   docusate sodium  100 mg Oral BID   gabapentin  100 mg Oral TID   insulin aspart  0-15 Units Subcutaneous TID WC   insulin aspart  0-5 Units Subcutaneous QHS   melatonin  5 mg Oral QHS   mupirocin ointment  1 application Nasal BID   pantoprazole  40 mg Oral Daily   polyethylene glycol  17 g Oral Daily   sodium chloride flush  3 mL Intravenous Q12H   sodium chloride flush  3 mL Intravenous Q12H    Infusions:  sodium chloride     sodium chloride     sodium chloride Stopped (11/30/20 0631)   sodium chloride     cefTRIAXone (ROCEPHIN)  IV 2 g (12/03/20 1619)   dexmedetomidine (PRECEDEX) IV infusion Stopped (11/30/20 0037)   norepinephrine (LEVOPHED) Adult infusion Stopped (12/02/20 1841)     PRN Medications: sodium chloride, sodium chloride, acetaminophen (TYLENOL) oral liquid 160 mg/5 mL, acetaminophen, fentaNYL (SUBLIMAZE) injection, fentaNYL (SUBLIMAZE) injection, ondansetron (ZOFRAN) IV, oxyCODONE, sodium chloride flush, sodium chloride flush, traZODone    Patient Profile   65 year old with a history of h/o CAD s/p previous MI, HTN, HL, ST jude ICD, S/P Barostim, and chronic systolic heart failure, EF 20-25%.  Presented 7/20 for outpatient RHC as part of VAD w/u. Post procedure, developed severe rigors, n/v and fever to 102 several hours after cardiac catheterization while in short stay. IVFs given for hypotension, labs and blood cx drawn. Admitted to ICU and started on broad spectrum  abx. Later developed acute hypoxic respiratory failure, requiring intubation. Blood Cx + for Strep.   TEE w/ with no evidence of endocarditis. 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 Likely low-flow low gradient AS. Mean gradient 18. VR = 0.19 VTI = 7.5. LVEF 20%. RV mild HK  Assessment/Plan   Strep Bacteremia - Blood cx + for Strep gordonii - Repeat cx 7/21 NGTD  - TEE w/ with no clear evidence of endocarditis (fibroelastoma on Aov -> cannot completely exclude infection)  - ID has seen -  now on ceftiraxone.   - has ICD and BaroStim. EP has seen ICD to come out today (ok to leave Baromstim as it is extravascular)  2. Septic Shock  - in setting of bacteremia and aspiration PNA - c/w abx per ID - see above - Now off pressors. Co-ox 70%   3. Acute Hypoxic Respiratory Failure - Resolved/extubated.  4. Chronic Systolic Heart Failure, ICM  - Echo 12/20 EF post anterior MI 30 to 35%. - Echo 6/21 EF 25-30% RV ok  - Echo 05/19/20 EF 20-25% RV mildly HK. moderate AS  Mean gradient 13 AVA 1.2 cm2 DI 0.30 - Initial RHC this admit with low filling pressures. CI marginal at 2.3 - hold eplerenone, entresto, farxiga, and carvedilol in setting of septic shock  - Co-ox 70%, off NE though BP still soft.  - c/w  digoxin 0.125  - CVP 7. Restart PO Lasix 40 daily  - Add Midodrine 5 tid for BP support  - concerned he will need advanced therpaies. Likely not transplant candidate given age and lung disease. Being elevated for LVAD, but currently not a candidate for implant given active infection/ bacteremia.  - Plan ICD extraction, 6 weeks IV abx. If surveillance cultures clear x 4 weeks can consider VAD implant.   5. CAD/NSTEMI  - s/p anterior STEMI 12/20. LHC showed chronically occluded RCA (with L>>R collaterals) and thrombotic occlusion of proximal LAD. Underwent PCI of LAD. - LHC this admit with stable CAD - Had NSTEMI in setting of septic shock - Continue ASA/statin.   6. Aortic Stenosis - AS is severe on TEE.  - Likely low-flow low gradient severe AS. Mean gradient 18. DI = 0.19 VTI = 7.5 - Presence of fibroelastoma prevents TAVR  7. RUE DVT  - started on heparin gtt, currently on hold for ICD extraction   Length of Stay: [place], PA-C  12/04/2020, 8:00 AM  Advanced Heart Failure Team Pager [phone] (M-F; 7a - 5p)  Please contact [facility] for night-coverage after hours (5p -7a ) and weekends on [email or web address]  Patient seen with PA, agree with the above note.   SBP 90s, CVP 6-7, co-ox 69.5%.  Off pressors.    Found to have RUE DVT, heparin gtt started.   General: NAD Neck: No JVD, no thyromegaly or thyroid nodule.  Lungs: Clear to auscultation bilaterally with normal respiratory effort. CV: Nondisplaced PMI.  Heart regular S1/S2, no S3/S4, no murmur.  No peripheral edema.   Abdomen: Soft, nontender, no hepatosplenomegaly, no distention.  Skin: Intact without lesions or rashes.  Neurologic: Alert and oriented x 3.  Psych: Normal affect. Extremities: No clubbing or cyanosis.  HEENT: Normal.   Patient remains on ceftriaxone for Strep.  He is going for device extraction today.   RUE DVT on heparin gtt, eventually to Eliquis. Holding for now with device extraction.    Can stop Decadron.   Start midodrine 5 mg tid and Lasix 40 mg po daily.   Loralie Champagne 12/04/2020 8:25 AM

## 2020-12-04 NOTE — Anesthesia Preprocedure Evaluation (Addendum)
Anesthesia Evaluation  Patient identified by MRN, date of birth, ID band Patient awake    Reviewed: Allergy & Precautions, NPO status , Patient's Chart, lab work & pertinent test results  Airway Mallampati: II  TM Distance: >3 FB Neck ROM: Full    Dental  (+) Teeth Intact   Pulmonary COPD, former smoker,    Pulmonary exam normal        Cardiovascular hypertension, Pt. on medications + CAD, + Past MI and +CHF  + Cardiac Defibrillator  Rhythm:Regular Rate:Normal + Systolic murmurs Barostim   Neuro/Psych Depression negative neurological ROS     GI/Hepatic Neg liver ROS, hiatal hernia,   Endo/Other  negative endocrine ROS  Renal/GU negative Renal ROS  negative genitourinary   Musculoskeletal  (+) Arthritis , Osteoarthritis,    Abdominal (+)  Abdomen: soft. Bowel sounds: normal.  Peds  Hematology  (+) anemia ,   Anesthesia Other Findings   Reproductive/Obstetrics                            Anesthesia Physical Anesthesia Plan  ASA: 4  Anesthesia Plan: General   Post-op Pain Management:    Induction: Intravenous  PONV Risk Score and Plan: 2 and Ondansetron, Dexamethasone, Midazolam and Treatment may vary due to age or medical condition  Airway Management Planned: Mask and Oral ETT  Additional Equipment: Arterial line  Intra-op Plan:   Post-operative Plan: Extubation in OR  Informed Consent: I have reviewed the patients History and Physical, chart, labs and discussed the procedure including the risks, benefits and alternatives for the proposed anesthesia with the patient or authorized representative who has indicated his/her understanding and acceptance.     Dental advisory given  Plan Discussed with: CRNA  Anesthesia Plan Comments: (TEE FOR MONITORING ONLY  Lab Results      Component                Value               Date                      WBC                       9.2                 12/04/2020                HGB                      8.3 (L)             12/04/2020                HCT                      25.0 (L)            12/04/2020                MCV                      87.1                12/04/2020                PLT  145 (L)             12/04/2020           Lab Results      Component                Value               Date                      NA                       131 (L)             12/04/2020                K                        3.8                 12/04/2020                CO2                      25                  12/04/2020                GLUCOSE                  127 (H)             12/04/2020                BUN                      23                  12/04/2020                CREATININE               0.73                12/04/2020                CALCIUM                  8.1 (L)             12/04/2020                GFRNONAA                 >60                 12/04/2020                GFRAA                    103                 06/28/2020            ECHO 07/22: IMPRESSIONS  1. Left ventricular ejection fraction, by estimation, is 20%. The left  ventricle has severely decreased function. The left ventricle demonstrates  global hypokinesis. The left ventricular internal cavity size was mildly  dilated.  2. No vegetation on ICD lead. Right ventricular systolic function is  mildly reduced. The right ventricular size is normal.  3. Left atrial size was severely dilated. No left atrial/left atrial  appendage thrombus was detected.  4. Right atrial size was mildly dilated.  5. The mitral valve is normal in structure. Mild mitral valve  regurgitation.  6. Aortic valve is severely calcified with markedly decresed cusp  excursion. Likely low-flow low gradient AS with mean gradient25mm HG. VR =  0.19. VTI = 7.5. There is a nodular density on the aortic side of the  valve on the [place] that appears stable from   recent CT. 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 The aortic valve is tricuspid. There is severe calcifcation of the  aortic valve. Aortic valve regurgitation is mild. Severe aortic valve  stenosis.  7. There is Severe (Grade IV) plaque involving the descending aorta.   Cath 07/22: .  Prox Cx lesion is 50% stenosed. .  Prox RCA to Mid RCA lesion is 100% stenosed. Colon Flattery RCA to Prox RCA lesion is 50% stenosed. .  Non-stenotic Mid LAD lesion was previously treated.  Findings: Ao = 81/50 (63) LV = 99/12 RA = 4 RV = 24/6 PA = 19/5 (14) PCW = 3 Fick cardiac output/index = 4.7/2.3 PVR = 2.3 WU Ao sat = 99% PA sat = 69%, 70%  Assessment: 1. Stable 2v CAD 2. Severe iCM EF 20% 3. Low filling pressures with moderately reduced cardiac output 4. Heavily calcified aortic valve (difficult to cross). Appears to be moderate by pressures but may have component of low-gradient, low-flow AS  )      Anesthesia Quick Evaluation

## 2020-12-04 NOTE — Op Note (Signed)
SURGEON:  Lars Mage, MD      PREPROCEDURE DIAGNOSES:   1. Ischemic cardiomyopathy.   2. New York Heart Association class III, heart failure chronically.   3.  Streptococcus bacteremia and septic shock   POSTPROCEDURE DIAGNOSES:   1. Ischemic cardiomyopathy.   2. New York Heart Association class III, heart failure chronically.   3.  Streptococcus bacteremia and septic shock     PROCEDURES:    1. ICD system extraction  2.  Partial capsulectomy  3.  Transesophageal echocardiogram     INTRODUCTION: Brandon [name] is a 65 y.o. male with an ischemic cardiomyopathy post ICD implant who was admitted with septic shock secondary to Streptococcus bacteremia.  He presents to the operating room today for ICD system extraction and transesophageal echocardiogram.      DESCRIPTION OF PROCEDURE:  Informed written consent was obtained and the patient was brought to the operating room in the fasting state. The patient was adequately sedated as noted in the anesthesia report. The patient's left chest was prepped and draped in the usual sterile fashion by the EP lab staff.  Bilateral groin sites were prepped and draped.    Using ultrasound guidance and modified Seldinger technique, the right common femoral vein was accessed and an 8 French sheath positioned.  Next, I opened the left prepectoral pocket and delivered the patient's existing ICD from the pocket.  The tissues within the pocket appeared to be healthy.  A partial capsulectomy was performed.  I freed the suture sleeve from the pectoral fascia and passed a standard stylette to the lead tip.  Unfortunately, the lead tip did not fully withdraw from the myocardium.  I then removed the standard stylette.  I then prepped the lead in the standard fashion by cutting the lead tip and passing a locking liberator stylette to the distal tip of the ICD lead.  I then secured the locking stylette.  I then positioned a 1 tie on the proximal portion of the  lead.  Next, I upsized the 8 French sheath to a 12 French introducer sheath in the right common femoral vein.  I passed a stiff wire to the right subclavian vein.  I then repositioned the bridge balloon at the right subclavian vein/SVC interface and did a test inflation.  I then staged the balloon in the inferior vena cava.  Our anesthesia colleagues arrived to the room to monitor the extraction process on transesophageal echo.  Our cardiothoracic surgeon was immediately available while power sheaths were applied to the lead.  I used a [employer] tight rail sub-c sheath while observing under fluoroscopy to free up adhesions within the left subclavian vein.  Once these were freed, the lead withdrew from the vasculature with simple traction.  There was no increase in pericardial effusion or damage to the tricuspid valve noted on transesophageal echocardiogram.  Next I removed the stiff wire and bridge balloon.  I removed the sheath from the right common femoral vein and hemostasis was achieved with manual pressure.  Hemostasis was achieved with manual pressure over the venotomy.  I irrigated the pocket with antibiotic solution.  I closed the skin with interrupted mattress suture.     CONCLUSIONS:  1. Ischemic cardiomyopathy with chronic New York Heart Association class III heart failure.  2.  Septic shock secondary to Streptococcus bacteremia 3.  No early apparent complications.  4.  We will plan to start a NOAC tomorrow evening while maintaining a pressure bandage over the left prepectoral pocket  for the next 5 to 7 days. 5.  Interrupted sutures to be removed in 7 to 10 days.   Lysbeth Galas T. Quentin Ore, MD, [facility], [facility] Cardiac Electrophysiology

## 2020-12-04 NOTE — Progress Notes (Signed)
      [address]       [place] [address]             [phone]      I provided in house surgical back up for pacemaker/ ICD extraction for 35 minutes today.  Revonda Standard Roxan Hockey, MD [facility] [phone]

## 2020-12-04 NOTE — Progress Notes (Signed)
[facility] for Infectious Disease  Date of Admission:  11/29/2020     Total days of antibiotics 6         ASSESSMENT:  Brandon [name] repeat blood cultures have remained without growth. Scheduled for ICD removal today in the setting of Group G Streptococcus. Clinically continues to improve. Will need PICC line placed prior to discharge for 6 weeks of antibiotics with Ceftriaxone. Final recommendations pending. Continue current dose of ceftriaxone.   PLAN:  Continue ceftriaxone ICD planned for removal today. PICC line prior to discharge  Monitor blood cultures for continued clearance of bacteremia.   Principal Problem:   Acute respiratory failure with hypoxia and hypercapnia (HCC) Active Problems:   Cardiogenic shock (HCC)   Ischemic cardiomyopathy   Dyspnea   Elevated brain natriuretic peptide (BNP) level   Elevated troponin   Hypokalemia   Hypomagnesemia   Normocytic anemia   Acute respiratory failure (HCC)   History of ETT   Infected defibrillator (HCC)   Streptococcal bacteremia   Central line infection   Cholelithiases   Aortic valve endocarditis   Pleural effusion   Aspiration into airway   Malnutrition of moderate degree   Pressure injury of skin    atorvastatin  80 mg Oral Daily   chlorhexidine  60 mL Topical Once   Chlorhexidine Gluconate Cloth  6 each Topical Q0600   digoxin  0.125 mg Oral Daily   docusate sodium  100 mg Oral BID   furosemide  40 mg Oral Daily   gabapentin  100 mg Oral TID   insulin aspart  0-15 Units Subcutaneous TID WC   insulin aspart  0-5 Units Subcutaneous QHS   melatonin  5 mg Oral QHS   midodrine  5 mg Oral TID WC   mupirocin ointment  1 application Nasal BID   pantoprazole  40 mg Oral Daily   polyethylene glycol  17 g Oral Daily   sodium chloride flush  3 mL Intravenous Q12H   sodium chloride flush  3 mL Intravenous Q12H    SUBJECTIVE:  Afebrile overnight with no acute events. Feeling well today with no new  concerns/complaints. Awaiting surgery to remove ICD. Denies fevers/chills.   No Known Allergies   Review of Systems: Review of Systems  Constitutional:  Negative for chills, fever and weight loss.  Respiratory:  Negative for cough, shortness of breath and wheezing.   Cardiovascular:  Negative for chest pain and leg swelling.  Gastrointestinal:  Negative for abdominal pain, constipation, diarrhea, nausea and vomiting.  Skin:  Negative for rash.     OBJECTIVE: Vitals:   12/04/20 0800 12/04/20 0810 12/04/20 0900 12/04/20 1023  BP: 97/64  99/75   Pulse:    88  Resp: 17  (!) 21   Temp:  (!) 97.5 F (36.4 C)    TempSrc:  Oral    SpO2: 95%  93%   Weight:      Height:       Body mass index is 25.3 kg/m.  Physical Exam Constitutional:      General: He is not in acute distress.    Appearance: He is well-developed.     Comments: Lying in bed with head of bed elevated; pleasant.   Cardiovascular:     Rate and Rhythm: Normal rate and regular rhythm.     Heart sounds: Normal heart sounds.     Comments: Central line in right neck. Dressing clean and dry.  Pulmonary:     Effort: Pulmonary  effort is normal.     Breath sounds: Normal breath sounds.  Skin:    General: Skin is warm and dry.  Neurological:     Mental Status: He is alert and oriented to person, place, and time.  Psychiatric:        Behavior: Behavior normal.        Thought Content: Thought content normal.        Judgment: Judgment normal.    Lab Results Lab Results  Component Value Date   WBC 9.2 12/04/2020   HGB 8.3 (L) 12/04/2020   HCT 25.0 (L) 12/04/2020   MCV 87.1 12/04/2020   PLT 145 (L) 12/04/2020    Lab Results  Component Value Date   CREATININE 0.73 12/04/2020   BUN 23 12/04/2020   NA 131 (L) 12/04/2020   K 3.8 12/04/2020   CL 100 12/04/2020   CO2 25 12/04/2020    Lab Results  Component Value Date   ALT 70 (H) 11/30/2020   AST 63 (H) 11/30/2020   ALKPHOS 66 11/30/2020   BILITOT 1.3 (H)  11/30/2020     Microbiology: Recent Results (from the past 240 hour(s))  Culture, blood (routine x 2)     Status: Abnormal   Collection Time: 11/29/20 12:54 PM   Specimen: BLOOD  Result Value Ref Range Status   Specimen Description BLOOD SITE NOT SPECIFIED  Final   Special Requests   Final    BOTTLES DRAWN AEROBIC AND ANAEROBIC Blood Culture adequate volume   Culture  Setup Time   Final    GRAM POSITIVE COCCI IN CHAINS IN BOTH AEROBIC AND ANAEROBIC BOTTLES CRITICAL VALUE NOTED.  VALUE IS CONSISTENT WITH PREVIOUSLY REPORTED AND CALLED VALUE.    Culture (A)  Final    STREPTOCOCCUS GORDONII SUSCEPTIBILITIES PERFORMED ON PREVIOUS CULTURE WITHIN THE LAST 5 DAYS. Performed at [facility], [place] [address] [place], [place] [address]    Report Status 12/02/2020 FINAL  Final  Culture, blood (routine x 2)     Status: Abnormal   Collection Time: 11/29/20  1:04 PM   Specimen: BLOOD RIGHT HAND  Result Value Ref Range Status   Specimen Description BLOOD RIGHT HAND  Final   Special Requests   Final    BOTTLES DRAWN AEROBIC AND ANAEROBIC Blood Culture results may not be optimal due to an excessive volume of blood received in culture bottles   Culture  Setup Time   Final    GRAM POSITIVE COCCI IN CHAINS IN BOTH AEROBIC AND ANAEROBIC BOTTLES CRITICAL RESULT CALLED TO, READ BACK BY AND VERIFIED WITH: C. AMEND,PHARMD [id number] 11/30/2020 T. TYSOR Performed at [facility], [place] [address], [place], [place] [address]    Culture STREPTOCOCCUS GORDONII (A)  Final   Report Status 12/02/2020 FINAL  Final   Organism ID, Bacteria STREPTOCOCCUS GORDONII  Final      Susceptibility   Streptococcus gordonii - MIC*    PENICILLIN <=0.06 SENSITIVE Sensitive     CEFTRIAXONE <=0.12 SENSITIVE Sensitive     ERYTHROMYCIN <=0.12 SENSITIVE Sensitive     LEVOFLOXACIN 1 SENSITIVE Sensitive     VANCOMYCIN 0.5 SENSITIVE Sensitive     * STREPTOCOCCUS GORDONII  Blood Culture ID Panel (Reflexed)     Status:  Abnormal   Collection Time: 11/29/20  1:04 PM  Result Value Ref Range Status   Enterococcus faecalis NOT DETECTED NOT DETECTED Final   Enterococcus Faecium NOT DETECTED NOT DETECTED Final   Listeria monocytogenes NOT DETECTED NOT DETECTED  Final   Staphylococcus species NOT DETECTED NOT DETECTED Final   Staphylococcus aureus (BCID) NOT DETECTED NOT DETECTED Final   Staphylococcus epidermidis NOT DETECTED NOT DETECTED Final   Staphylococcus lugdunensis NOT DETECTED NOT DETECTED Final   Streptococcus species DETECTED (A) NOT DETECTED Final    Comment: Not Enterococcus species, Streptococcus agalactiae, Streptococcus pyogenes, or Streptococcus pneumoniae. CRITICAL RESULT CALLED TO, READ BACK BY AND VERIFIED WITH: C. AMEND,PHARMD [id number] 11/30/2020 T. TYSOR    Streptococcus agalactiae NOT DETECTED NOT DETECTED Final   Streptococcus pneumoniae NOT DETECTED NOT DETECTED Final   Streptococcus pyogenes NOT DETECTED NOT DETECTED Final   A.calcoaceticus-baumannii NOT DETECTED NOT DETECTED Final   Bacteroides fragilis NOT DETECTED NOT DETECTED Final   Enterobacterales NOT DETECTED NOT DETECTED Final   Enterobacter cloacae complex NOT DETECTED NOT DETECTED Final   Escherichia coli NOT DETECTED NOT DETECTED Final   Klebsiella aerogenes NOT DETECTED NOT DETECTED Final   Klebsiella oxytoca NOT DETECTED NOT DETECTED Final   Klebsiella pneumoniae NOT DETECTED NOT DETECTED Final   Proteus species NOT DETECTED NOT DETECTED Final   Salmonella species NOT DETECTED NOT DETECTED Final   Serratia marcescens NOT DETECTED NOT DETECTED Final   Haemophilus influenzae NOT DETECTED NOT DETECTED Final   Neisseria meningitidis NOT DETECTED NOT DETECTED Final   Pseudomonas aeruginosa NOT DETECTED NOT DETECTED Final   Stenotrophomonas maltophilia NOT DETECTED NOT DETECTED Final   Candida albicans NOT DETECTED NOT DETECTED Final   Candida auris NOT DETECTED NOT DETECTED Final   Candida glabrata NOT DETECTED NOT DETECTED  Final   Candida krusei NOT DETECTED NOT DETECTED Final   Candida parapsilosis NOT DETECTED NOT DETECTED Final   Candida tropicalis NOT DETECTED NOT DETECTED Final   Cryptococcus neoformans/gattii NOT DETECTED NOT DETECTED Final    Comment: Performed at [facility], [address], [place], [place] [address]  [name] Next Gen by PCR, Nasal     Status: None   Collection Time: 11/29/20  2:01 PM  Result Value Ref Range Status   [name] by PCR Next Gen NOT DETECTED NOT DETECTED Final    Comment: (NOTE) The GeneXpert [name] Assay (FDA approved for NASAL specimens only), is one component of a comprehensive [name] colonization surveillance program. It is not intended to diagnose [name] infection nor to guide or monitor treatment for [name] infections. Test performance is not FDA approved in patients less than 33 years old. Performed at [facility], [place] [address], [place], [place] [address]   Culture, blood (routine x 2)     Status: None (Preliminary result)   Collection Time: 11/30/20  8:14 AM   Specimen: BLOOD RIGHT HAND  Result Value Ref Range Status   Specimen Description BLOOD RIGHT HAND  Final   Special Requests   Final    BOTTLES DRAWN AEROBIC ONLY Blood Culture adequate volume   Culture   Final    NO GROWTH 4 DAYS Performed at [facility], [place] [address] [place], [place] [address]    Report Status PENDING  Incomplete  Culture, blood (routine x 2)     Status: None (Preliminary result)   Collection Time: 11/30/20  8:20 AM   Specimen: BLOOD LEFT HAND  Result Value Ref Range Status   Specimen Description BLOOD LEFT HAND  Final   Special Requests   Final    BOTTLES DRAWN AEROBIC ONLY Blood Culture results may not be optimal due to an inadequate volume of blood received in culture bottles  Culture   Final    NO GROWTH 4 DAYS Performed at [facility], [place] [address], [place], [place] [address]    Report Status PENDING  Incomplete  Surgical PCR screen     Status:  Abnormal   Collection Time: 12/03/20  4:50 AM   Specimen: Nasal Mucosa; Nasal Swab  Result Value Ref Range Status   [name], PCR NEGATIVE NEGATIVE Final   Staphylococcus aureus POSITIVE (A) NEGATIVE Final    Comment: (NOTE) The Xpert SA Assay (FDA approved for NASAL specimens in patients 80 years of age and older), is one component of a comprehensive surveillance program. It is not intended to diagnose infection nor to guide or monitor treatment. Performed at [facility], [place] [address], [place], [place] [address]   Resp Panel by RT-PCR (Flu A&B, Covid) Nasopharyngeal Swab     Status: None   Collection Time: 12/04/20  9:16 AM   Specimen: Nasopharyngeal Swab; Nasopharyngeal(NP) swabs in vial transport medium  Result Value Ref Range Status   SARS Coronavirus 2 by RT PCR NEGATIVE NEGATIVE Final    Comment: (NOTE) SARS-CoV-2 target nucleic acids are NOT DETECTED.  The SARS-CoV-2 RNA is generally detectable in upper respiratory specimens during the acute phase of infection. The lowest concentration of SARS-CoV-2 viral copies this assay can detect is 138 copies/mL. A negative result does not preclude SARS-Cov-2 infection and should not be used as the sole basis for treatment or other patient management decisions. A negative result may occur with  improper specimen collection/handling, submission of specimen other than nasopharyngeal swab, presence of viral mutation(s) within the areas targeted by this assay, and inadequate number of viral copies(<138 copies/mL). A negative result must be combined with clinical observations, patient history, and epidemiological information. The expected result is Negative.  Fact Sheet for Patients:  EntrepreneurPulse.com.au  Fact Sheet for Healthcare Providers:  IncredibleEmployment.be  This test is no t yet approved or cleared by the [place] FDA and  has been authorized for detection and/or diagnosis of  SARS-CoV-2 by FDA under an Emergency Use Authorization (EUA). This EUA will remain  in effect (meaning this test can be used) for the duration of the COVID-19 declaration under Section 564(b)(1) of the Act, 21 U.S.C.section 360bbb-3(b)(1), unless the authorization is terminated  or revoked sooner.       Influenza A by PCR NEGATIVE NEGATIVE Final   Influenza B by PCR NEGATIVE NEGATIVE Final    Comment: (NOTE) The Xpert Xpress SARS-CoV-2/FLU/RSV plus assay is intended as an aid in the diagnosis of influenza from Nasopharyngeal swab specimens and should not be used as a sole basis for treatment. Nasal washings and aspirates are unacceptable for Xpert Xpress SARS-CoV-2/FLU/RSV testing.  Fact Sheet for Patients: EntrepreneurPulse.com.au  Fact Sheet for Healthcare Providers: IncredibleEmployment.be  This test is not yet approved or cleared by the [place] FDA and has been authorized for detection and/or diagnosis of SARS-CoV-2 by FDA under an Emergency Use Authorization (EUA). This EUA will remain in effect (meaning this test can be used) for the duration of the COVID-19 declaration under Section 564(b)(1) of the Act, 21 U.S.C. section 360bbb-3(b)(1), unless the authorization is terminated or revoked.  Performed at [facility], [place] [address], [place],  [address]      Terri Piedra, [facility] Infectious [facility]  12/04/2020  10:42 AM

## 2020-12-05 ENCOUNTER — Encounter (HOSPITAL_COMMUNITY): Payer: Self-pay | Admitting: Cardiology

## 2020-12-05 ENCOUNTER — Other Ambulatory Visit (HOSPITAL_COMMUNITY): Payer: Self-pay

## 2020-12-05 ENCOUNTER — Inpatient Hospital Stay: Payer: Self-pay

## 2020-12-05 DIAGNOSIS — I358 Other nonrheumatic aortic valve disorders: Secondary | ICD-10-CM | POA: Diagnosis not present

## 2020-12-05 DIAGNOSIS — J9622 Acute and chronic respiratory failure with hypercapnia: Secondary | ICD-10-CM

## 2020-12-05 DIAGNOSIS — J9602 Acute respiratory failure with hypercapnia: Secondary | ICD-10-CM | POA: Diagnosis not present

## 2020-12-05 DIAGNOSIS — R570 Cardiogenic shock: Secondary | ICD-10-CM | POA: Diagnosis not present

## 2020-12-05 DIAGNOSIS — T17908D Unspecified foreign body in respiratory tract, part unspecified causing other injury, subsequent encounter: Secondary | ICD-10-CM | POA: Diagnosis not present

## 2020-12-05 DIAGNOSIS — J9601 Acute respiratory failure with hypoxia: Secondary | ICD-10-CM | POA: Diagnosis not present

## 2020-12-05 DIAGNOSIS — Z95810 Presence of automatic (implantable) cardiac defibrillator: Secondary | ICD-10-CM

## 2020-12-05 DIAGNOSIS — J9621 Acute and chronic respiratory failure with hypoxia: Secondary | ICD-10-CM

## 2020-12-05 LAB — CBC
HCT: 27.1 % — ABNORMAL LOW (ref 39.0–52.0)
Hemoglobin: 9.1 g/dL — ABNORMAL LOW (ref 13.0–17.0)
MCH: 29.3 pg (ref 26.0–34.0)
MCHC: 33.6 g/dL (ref 30.0–36.0)
MCV: 87.1 fL (ref 80.0–100.0)
Platelets: 175 10*3/uL (ref 150–400)
RBC: 3.11 MIL/uL — ABNORMAL LOW (ref 4.22–5.81)
RDW: 15 % (ref 11.5–15.5)
WBC: 10.5 10*3/uL (ref 4.0–10.5)
nRBC: 0 % (ref 0.0–0.2)

## 2020-12-05 LAB — CULTURE, BLOOD (ROUTINE X 2)
Culture: NO GROWTH
Culture: NO GROWTH
Special Requests: ADEQUATE

## 2020-12-05 LAB — GLUCOSE, CAPILLARY
Glucose-Capillary: 130 mg/dL — ABNORMAL HIGH (ref 70–99)
Glucose-Capillary: 128 mg/dL — ABNORMAL HIGH (ref 70–99)
Glucose-Capillary: 104 mg/dL — ABNORMAL HIGH (ref 70–99)
Glucose-Capillary: 114 mg/dL — ABNORMAL HIGH (ref 70–99)

## 2020-12-05 LAB — COOXEMETRY PANEL
Carboxyhemoglobin: 1 % (ref 0.5–1.5)
Methemoglobin: 1 % (ref 0.0–1.5)
O2 Saturation: 68.2 %
Total hemoglobin: 12.2 g/dL (ref 12.0–16.0)

## 2020-12-05 LAB — BASIC METABOLIC PANEL
Anion gap: 8 (ref 5–15)
BUN: 23 mg/dL (ref 8–23)
CO2: 27 mmol/L (ref 22–32)
Calcium: 8.1 mg/dL — ABNORMAL LOW (ref 8.9–10.3)
Chloride: 100 mmol/L (ref 98–111)
Creatinine, Ser: 0.77 mg/dL (ref 0.61–1.24)
GFR, Estimated: >60 mL/min (ref >60)
Glucose, Bld: 115 mg/dL — ABNORMAL HIGH (ref 70–99)
Potassium: 4 mmol/L (ref 3.5–5.1)
Sodium: 135 mmol/L (ref 135–145)

## 2020-12-05 MED ORDER — FUROSEMIDE 40 MG PO TABS
40.0000 mg | ORAL_TABLET | Freq: Two times a day (BID) | ORAL | Status: DC
  Administered 2020-12-05 – 2020-12-08 (×7): 40 mg via ORAL
  Filled 2020-12-05 (×7): qty 1

## 2020-12-05 MED ORDER — APIXABAN 5 MG PO TABS
5.0000 mg | ORAL_TABLET | Freq: Two times a day (BID) | ORAL | Status: DC
  Administered 2020-12-05 – 2020-12-11 (×12): 5 mg via ORAL
  Filled 2020-12-05 (×12): qty 1

## 2020-12-05 NOTE — Anesthesia Postprocedure Evaluation (Signed)
Anesthesia Post Note  Patient: Brandon [name]  Procedure(s) Performed: ICD SYSTEM EXTRACTION TRANSESOPHAGEAL ECHOCARDIOGRAM (TEE)     Patient location during evaluation: SICU Anesthesia Type: General Level of consciousness: awake and alert Pain management: pain level controlled Vital Signs Assessment: post-procedure vital signs reviewed and stable Respiratory status: patient connected to face mask oxygen Cardiovascular status: stable Postop Assessment: no apparent nausea or vomiting Anesthetic complications: no   No notable events documented.  Last Vitals:  Vitals:   12/05/20 0500 12/05/20 0600  BP: (!) 86/65 98/76  Pulse:    Resp: 18 (!) 21  Temp:    SpO2: 92% 91%    Last Pain:  Vitals:   12/05/20 0046  TempSrc:   PainSc: 0-No pain                 March Rummage Reilyn Nelson

## 2020-12-05 NOTE — Progress Notes (Addendum)
Patient ID: Brandon [name], male   DOB: 11-07-1955, 65 y.o.   MRN: [id number]     Advanced Heart Failure Rounding Note  PCP-Cardiologist: [name], MD  [facility]: Dr. 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 Post procedure, developed severe rigors, n/v and fever to 102 then developed acute hypoxic respiratory failure, requiring intubation - 7/21 Blood Cx + for Strep. 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 Mean gradient 18. VR = 0.19 VTI = 7.5. RV mild HK - 7/22 CT C/A/P notable for aspiration PNA. No evidence septic emboli - Extubated 7/23 - Rt UE DVT 7/24 - ICD Extraction 7/25  Subjective:    BCx + for strep gordonii. F/u cx NGTD. He remains on ceftriaxone.   Underwent successful ICD extraction yesterday.   Overnight, developed dyspnea and increased O2 requirements from 2>>6L/min. CVP elevated at 12-13, CXR w/ congestion. No PTX. IV Lasix given.  Feels better today, O2 down to 3L/min. CVP 7   Complains of mild post operative pain.   AF. Leukocytosis resolved, WBC now 10k.   Off pressors/inotropes. Co-ox 68%.     Objective:   Weight Range: 84 kg Body mass index is 25.12 kg/m.   Vital Signs:   Temp:  [97.4 F (36.3 C)-97.8 F (36.6 C)] 97.5 F (36.4 C) (07/25 2342) Pulse Rate:  [78-122] 78 (07/26 0330) Resp:  [14-35] 21 (07/26 0600) BP: (83-116)/(61-87) 98/76 (07/26 0600) SpO2:  [87 %-98 %] 91 % (07/26 0600) Arterial Line BP: (70-117)/(66-113) 70/66 (07/26 0400) Weight:  [84 kg] 84 kg (07/26 0500) Last BM Date: 12/03/20  Weight change: Filed Weights   12/03/20 1100 12/04/20 0600  12/05/20 0500  Weight: 84.3 kg 84.6 kg 84 kg    Intake/Output:   Intake/Output Summary (Last 24 hours) at 12/05/2020 0728 Last data filed at 12/05/2020 0600 Gross per 24 hour  Intake 1193.06 ml  Output 2770 ml  Net -1576.94 ml      Physical Exam   CVP 7  General:  Well appearing. No respiratory difficulty HEENT: normal Neck: supple. JVD 7 cm. + Rt IJ CVC Carotids 2+ bilat; no bruits. No lymphadenopathy or thyromegaly appreciated. Cor: PMI nondisplaced. Regular rhythm, mildly tachy rate. 3/6 murmur RUSB, Device pocket bandaged  Lungs: decreased BS at the bases  Abdomen: soft, nontender, nondistended. No hepatosplenomegaly. No bruits or masses. Good bowel sounds. Extremities: no cyanosis, clubbing, rash, edema Neuro: alert & oriented x 3, cranial nerves grossly intact. moves all 4 extremities w/o difficulty. Affect pleasant.    Telemetry   Sinus tach low 100s. Personally reviewed   Labs    CBC Recent Labs    12/04/20 0352 12/05/20 0454  WBC 9.2 10.5  HGB 8.3* 9.1*  HCT 25.0* 27.1*  MCV 87.1 87.1  PLT 145* 097   Basic Metabolic Panel Recent Labs    12/03/20 0450 12/04/20 0352 12/05/20 0454  NA 135 131* 135  K 3.4* 3.8 4.0  CL 104 100 100  CO2 _0 GLUCOSE 93 127* 115*  BUN _1 CREATININE 0.77 0.73 0.77  CALCIUM 8.0*  8.1* 8.1*  MG 2.1 2.1  --   PHOS 2.0* 2.7  --    Liver Function Tests No results for input(s): AST, ALT, ALKPHOS, BILITOT, PROT, ALBUMIN in the last 72 hours.  No results for input(s): LIPASE, AMYLASE in the last 72 hours. Cardiac Enzymes No results for input(s): CKTOTAL, CKMB, CKMBINDEX, TROPONINI in the last 72 hours.  BNP: BNP (last 3 results) Recent Labs    11/01/20 1224 11/29/20 2240 11/30/20 0144  BNP 462.1* 1,586.1* 2,653.1*    ProBNP (last 3 results) Recent Labs    03/23/20 0922  PROBNP 520*     D-Dimer No results for input(s): DDIMER in the last 72 hours. Hemoglobin A1C No results for input(s):  HGBA1C in the last 72 hours.  Fasting Lipid Panel No results for input(s): CHOL, HDL, LDLCALC, TRIG, CHOLHDL, LDLDIRECT in the last 72 hours. Thyroid Function Tests No results for input(s): TSH, T4TOTAL, T3FREE, THYROIDAB in the last 72 hours.  Invalid input(s): FREET3  Other results:   Imaging    DG Chest Port 1 View  Result Date: 12/04/2020 CLINICAL DATA:  ICD in place EXAM: PORTABLE CHEST 1 VIEW COMPARISON:  12/01/2020 FINDINGS: Left-sided ICD has been removed. No evidence of retained component. Stimulator on the right, extending into the right neck appears unchanged. Right internal jugular central line tip in the SVC above the right atrium. Mild interstitial edema persists. Small amount of bilateral pleural fluid. No pneumothorax. IMPRESSION: Previously seen ICD has been removed. No evidence of retained components. Right-sided stimulator remains in place. Right IJ catheter remains in place. Mild interstitial edema and small effusions. No pneumothorax per Electronically Signed   By: Nelson Chimes M.D.   On: 12/04/2020 20:52   HYBRID OR IMAGING ([facility] ONLY)  Result Date: 12/04/2020 There is no interpretation for this exam.  This order is for images obtained during a surgical procedure.  Please See "Surgeries" Tab for more information regarding the procedure.     Medications:     Scheduled Medications:  sodium chloride   Intravenous Once   atorvastatin  80 mg Oral Daily   Chlorhexidine Gluconate Cloth  6 each Topical Q0600   digoxin  0.125 mg Oral Daily   docusate sodium  100 mg Oral BID   furosemide  40 mg Oral Daily   gabapentin  100 mg Oral TID   insulin aspart  0-15 Units Subcutaneous TID WC   insulin aspart  0-5 Units Subcutaneous QHS   melatonin  5 mg Oral QHS   midodrine  5 mg Oral TID WC   mupirocin ointment  1 application Nasal BID   pantoprazole  40 mg Oral Daily   polyethylene glycol  17 g Oral Daily   sodium chloride flush  3 mL Intravenous Q12H   sodium chloride  flush  3 mL Intravenous Q12H    Infusions:  sodium chloride     sodium chloride     sodium chloride Stopped (11/30/20 0631)   cefTRIAXone (ROCEPHIN)  IV 0 g (12/03/20 1650)    PRN Medications: sodium chloride, sodium chloride, acetaminophen (TYLENOL) oral liquid 160 mg/5 mL, acetaminophen, ondansetron (ZOFRAN) IV, oxyCODONE, sodium chloride flush, sodium chloride flush, traZODone    Patient Profile   65 year old with a history of h/o CAD s/p previous MI, HTN, HL, ST jude ICD, S/P Barostim, and chronic systolic heart failure, EF 20-25%.  Presented 7/20 for outpatient RHC as part of VAD w/u. Post procedure, developed severe rigors, n/v and fever to 102  several hours after cardiac catheterization while in short stay. IVFs given for hypotension, labs and blood cx drawn. Admitted to ICU and started on broad spectrum  abx. Later developed acute hypoxic respiratory failure, requiring intubation. Blood Cx + for Strep.   TEE w/ with no evidence of endocarditis. 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 Likely low-flow low gradient AS. Mean gradient 18. VR = 0.19 VTI = 7.5. LVEF 20%. RV mild HK  Assessment/Plan   Strep Bacteremia - Blood cx + for Strep gordonii - Repeat cx 7/21 NGTD  - TEE w/ with no clear evidence of endocarditis (fibroelastoma on Aov -> cannot completely exclude infection)  - ID has seen - now on ceftiraxone.   - ICD extracted 7/25. Per EP (ok to leave Baromstim as it is extravascular) -  Remove CVC and replace w/ PICC   2. Septic Shock  - in setting of bacteremia and aspiration PNA - c/w abx per ID - see above - Now off pressors. Co-ox 68%   3. Acute Hypoxic Respiratory Failure - Resolved/extubated.  4. Chronic Systolic Heart Failure, ICM  - Echo 12/20 EF post anterior MI 30 to 35%. - Echo 6/21 EF 25-30% RV ok  - Echo 05/19/20 EF 20-25% RV mildly HK. moderate AS  Mean gradient 13 AVA 1.2 cm2 DI 0.30 - Initial RHC this admit with low filling pressures. CI marginal at 2.3 - hold  eplerenone, entresto, farxiga, and carvedilol in setting of septic shock  - Co-ox 78%, off NE though BP still soft.  - c/w digoxin 0.125  - CVP 7. Restart PO Lasix 40 daily  - C/w Midodrine 5 tid for BP support  - concerned he will need advanced therpaies. Likely not transplant candidate given age and lung disease. Being elevated for LVAD, but currently not a candidate for implant given active infection/ bacteremia.  - Plan ICD extraction, 6 weeks IV abx. If surveillance cultures clear x 4 weeks can consider VAD implant.   5. CAD/NSTEMI  - s/p anterior STEMI 12/20. LHC showed chronically occluded RCA (with L>>R collaterals) and thrombotic occlusion of proximal LAD. Underwent PCI of LAD. - LHC this admit with stable CAD - Had NSTEMI in setting of septic shock - Continue ASA/statin.   6. Aortic Stenosis - AS is severe on TEE.  - Likely low-flow low gradient severe AS. Mean gradient 18. DI = 0.19 VTI = 7.5 - Presence of fibroelastoma prevents TAVR  7. RUE DVT  - started on heparin gtt, currently on hold post ICD extraction, resume once cleared by EP    Length of Stay: [address], PA-C  12/05/2020, 7:28 AM  Advanced Heart Failure Team Pager [phone] (M-F; 7a - 5p)  Please contact [facility] for night-coverage after hours (5p -7a ) and weekends on [email or web address]  Patient seen with PA, agree with the above note.   ICD out yesterday.  Dyspnea overnight, received IV Lasix with good diuresis.  Breathing better this morning.  CVP 7, co-ox 68%.   General: NAD Neck: No JVD, no thyromegaly or thyroid nodule.  Lungs: Clear to auscultation bilaterally with normal respiratory effort. CV: Nondisplaced PMI.  Heart regular S1/S2, no S3/S4, no murmur.  No peripheral edema.   Abdomen: Soft, nontender, no hepatosplenomegaly, no distention.  Skin: Intact without lesions or rashes.  Neurologic: Alert and oriented x 3.  Psych: Normal affect. Extremities: No clubbing or cyanosis.  Right arm  swelling.  HEENT: Normal.   Volume stable this morning, restart Lasix po at 40  mg bid.  SBP remains soft, continue midodrine.   ICD now out, remove CVL today.  Send cultures after CVL out.  Will need PICC eventually and 4-6 weeks ceftriaxone.   Right arm DVT, resume apixaban tonight per EP.   Can go to step down.   Loralie Champagne 12/05/2020 8:09 AM

## 2020-12-05 NOTE — Progress Notes (Signed)
Scott,RN aware of the discontinue central line order.   Delilah Shan Derrell Milanes,RN-VAST

## 2020-12-05 NOTE — Plan of Care (Signed)

## 2020-12-05 NOTE — Progress Notes (Signed)
PHARMACY CONSULT NOTE FOR:  OUTPATIENT  PARENTERAL ANTIBIOTIC THERAPY (OPAT)  Indication: Bacteremia  Regimen: ceftriaxone 2g IV q24h  End date: 01/15/2021  IV antibiotic discharge orders are pended. To discharging provider:  please sign these orders via discharge navigator,  Select New Orders & click on the button choice - Manage This Unsigned Work.     Thank you for allowing pharmacy to be a part of this patient's care.  Phillis Haggis 12/05/2020, 1:41 PM

## 2020-12-05 NOTE — Progress Notes (Signed)
[facility]  Date of Admission:  11/29/2020     Total days of antibiotics 7         ASSESSMENT:  Brandon [name] is POD 1 from ICD removal. Blood cultures finalized without growth. Agree with plan to remove central line and repeat blood cultures. If new cultures remain clear for 48 hours okay to place PICC line. Continue current dose of ceftriaxone with plan for 6 weeks through 9/5. Remaining medical care per primary team.   PLAN:  Continue ceftriaxone.  Remove central line.  Repeat blood culture after central line removal. PICC line placement once cultures clear for 48 hours.  Remaining care per primary team.   Principal Problem:   Acute respiratory failure with hypoxia and hypercapnia (HCC) Active Problems:   Cardiogenic shock (HCC)   Ischemic cardiomyopathy   Dyspnea   Elevated brain natriuretic peptide (BNP) level   Elevated troponin   Hypokalemia   Hypomagnesemia   Normocytic anemia   Acute respiratory failure (HCC)   History of ETT   Infected defibrillator (HCC)   Streptococcal bacteremia   Central line infection   Cholelithiases   Aortic valve endocarditis   Pleural effusion   Aspiration into airway   Malnutrition of moderate degree   Pressure injury of skin   Dental caries    sodium chloride   Intravenous Once   apixaban  5 mg Oral BID   atorvastatin  80 mg Oral Daily   Chlorhexidine Gluconate Cloth  6 each Topical Q0600   digoxin  0.125 mg Oral Daily   docusate sodium  100 mg Oral BID   furosemide  40 mg Oral BID   gabapentin  100 mg Oral TID   insulin aspart  0-15 Units Subcutaneous TID WC   insulin aspart  0-5 Units Subcutaneous QHS   melatonin  5 mg Oral QHS   midodrine  5 mg Oral TID WC   mupirocin ointment  1 application Nasal BID   pantoprazole  40 mg Oral Daily   polyethylene glycol  17 g Oral Daily   sodium chloride flush  3 mL Intravenous Q12H   sodium chloride flush  3 mL Intravenous Q12H     SUBJECTIVE:  Afebrile overnight with no acute events. ICD removed. Feeling well today with no new concerns/complaints. Denies fevers/chills.   No Known Allergies   Review of Systems: Review of Systems  Constitutional:  Negative for chills, fever and weight loss.  Respiratory:  Negative for cough, shortness of breath and wheezing.   Cardiovascular:  Negative for chest pain and leg swelling.  Gastrointestinal:  Negative for abdominal pain, constipation, diarrhea, nausea and vomiting.  Skin:  Negative for rash.     OBJECTIVE: Vitals:   12/05/20 0800 12/05/20 0900 12/05/20 1000 12/05/20 1100  BP: 117/85 106/77 115/89 106/74  Pulse:      Resp: (!) 25 (!) 26 (!) 31 14  Temp:      TempSrc:      SpO2: 90% 93% 90% 92%  Weight:      Height:       Body mass index is 25.12 kg/m.  Physical Exam Constitutional:      General: He is not in acute distress.    Appearance: He is well-developed.  Cardiovascular:     Rate and Rhythm: Normal rate and regular rhythm.     Heart sounds: Normal heart sounds.     Comments: Central line in right neck. Dressing clean and dry.  Pulmonary:     Effort: Pulmonary effort is normal.     Breath sounds: Normal breath sounds.  Skin:    General: Skin is warm and dry.     Comments: Left chest wall dressing is clean and dry.   Neurological:     Mental Status: He is alert and oriented to person, place, and time.  Psychiatric:        Behavior: Behavior normal.        Thought Content: Thought content normal.        Judgment: Judgment normal.    Lab Results Lab Results  Component Value Date   WBC 10.5 12/05/2020   HGB 9.1 (L) 12/05/2020   HCT 27.1 (L) 12/05/2020   MCV 87.1 12/05/2020   PLT 175 12/05/2020    Lab Results  Component Value Date   CREATININE 0.77 12/05/2020   BUN 23 12/05/2020   NA 135 12/05/2020   K 4.0 12/05/2020   CL 100 12/05/2020   CO2 27 12/05/2020    Lab Results  Component Value Date   ALT 70 (H) 11/30/2020    AST 63 (H) 11/30/2020   ALKPHOS 66 11/30/2020   BILITOT 1.3 (H) 11/30/2020     Microbiology: Recent Results (from the past 240 hour(s))  Culture, blood (routine x 2)     Status: Abnormal   Collection Time: 11/29/20 12:54 PM   Specimen: BLOOD  Result Value Ref Range Status   Specimen Description BLOOD SITE NOT SPECIFIED  Final   Special Requests   Final    BOTTLES DRAWN AEROBIC AND ANAEROBIC Blood Culture adequate volume   Culture  Setup Time   Final    GRAM POSITIVE COCCI IN CHAINS IN BOTH AEROBIC AND ANAEROBIC BOTTLES CRITICAL VALUE NOTED.  VALUE IS CONSISTENT WITH PREVIOUSLY REPORTED AND CALLED VALUE.    Culture (A)  Final    STREPTOCOCCUS GORDONII SUSCEPTIBILITIES PERFORMED ON PREVIOUS CULTURE WITHIN THE LAST 5 DAYS. Performed at [facility], [place] [address], [place], [place] [address]    Report Status 12/02/2020 FINAL  Final  Culture, blood (routine x 2)     Status: Abnormal   Collection Time: 11/29/20  1:04 PM   Specimen: BLOOD RIGHT HAND  Result Value Ref Range Status   Specimen Description BLOOD RIGHT HAND  Final   Special Requests   Final    BOTTLES DRAWN AEROBIC AND ANAEROBIC Blood Culture results may not be optimal due to an excessive volume of blood received in culture bottles   Culture  Setup Time   Final    GRAM POSITIVE COCCI IN CHAINS IN BOTH AEROBIC AND ANAEROBIC BOTTLES CRITICAL RESULT CALLED TO, READ BACK BY AND VERIFIED WITH: C. AMEND,PHARMD [id number] 11/30/2020 T. TYSOR Performed at [facility], [place] [address], [place], [place] [address]    Culture STREPTOCOCCUS GORDONII (A)  Final   Report Status 12/02/2020 FINAL  Final   Organism ID, Bacteria STREPTOCOCCUS GORDONII  Final      Susceptibility   Streptococcus gordonii - MIC*    PENICILLIN <=0.06 SENSITIVE Sensitive     CEFTRIAXONE <=0.12 SENSITIVE Sensitive     ERYTHROMYCIN <=0.12 SENSITIVE Sensitive     LEVOFLOXACIN 1 SENSITIVE Sensitive     VANCOMYCIN 0.5 SENSITIVE Sensitive     *  STREPTOCOCCUS GORDONII  Blood Culture ID Panel (Reflexed)     Status: Abnormal   Collection Time: 11/29/20  1:04 PM  Result Value Ref Range Status   Enterococcus faecalis NOT DETECTED NOT  DETECTED Final   Enterococcus Faecium NOT DETECTED NOT DETECTED Final   Listeria monocytogenes NOT DETECTED NOT DETECTED Final   Staphylococcus species NOT DETECTED NOT DETECTED Final   Staphylococcus aureus (BCID) NOT DETECTED NOT DETECTED Final   Staphylococcus epidermidis NOT DETECTED NOT DETECTED Final   Staphylococcus lugdunensis NOT DETECTED NOT DETECTED Final   Streptococcus species DETECTED (A) NOT DETECTED Final    Comment: Not Enterococcus species, Streptococcus agalactiae, Streptococcus pyogenes, or Streptococcus pneumoniae. CRITICAL RESULT CALLED TO, READ BACK BY AND VERIFIED WITH: C. AMEND,PHARMD [id number] 11/30/2020 T. TYSOR    Streptococcus agalactiae NOT DETECTED NOT DETECTED Final   Streptococcus pneumoniae NOT DETECTED NOT DETECTED Final   Streptococcus pyogenes NOT DETECTED NOT DETECTED Final   A.calcoaceticus-baumannii NOT DETECTED NOT DETECTED Final   Bacteroides fragilis NOT DETECTED NOT DETECTED Final   Enterobacterales NOT DETECTED NOT DETECTED Final   Enterobacter cloacae complex NOT DETECTED NOT DETECTED Final   Escherichia coli NOT DETECTED NOT DETECTED Final   Klebsiella aerogenes NOT DETECTED NOT DETECTED Final   Klebsiella oxytoca NOT DETECTED NOT DETECTED Final   Klebsiella pneumoniae NOT DETECTED NOT DETECTED Final   Proteus species NOT DETECTED NOT DETECTED Final   Salmonella species NOT DETECTED NOT DETECTED Final   Serratia marcescens NOT DETECTED NOT DETECTED Final   Haemophilus influenzae NOT DETECTED NOT DETECTED Final   Neisseria meningitidis NOT DETECTED NOT DETECTED Final   Pseudomonas aeruginosa NOT DETECTED NOT DETECTED Final   Stenotrophomonas maltophilia NOT DETECTED NOT DETECTED Final   Candida albicans NOT DETECTED NOT DETECTED Final   Candida auris NOT  DETECTED NOT DETECTED Final   Candida glabrata NOT DETECTED NOT DETECTED Final   Candida krusei NOT DETECTED NOT DETECTED Final   Candida parapsilosis NOT DETECTED NOT DETECTED Final   Candida tropicalis NOT DETECTED NOT DETECTED Final   Cryptococcus neoformans/gattii NOT DETECTED NOT DETECTED Final    Comment: Performed at [facility], [address], [place], [place] [address]  [name] Next Gen by PCR, Nasal     Status: None   Collection Time: 11/29/20  2:01 PM  Result Value Ref Range Status   [name] by PCR Next Gen NOT DETECTED NOT DETECTED Final    Comment: (NOTE) The GeneXpert [name] Assay (FDA approved for NASAL specimens only), is one component of a comprehensive [name] colonization surveillance program. It is not intended to diagnose [name] infection nor to guide or monitor treatment for [name] infections. Test performance is not FDA approved in patients less than 29 years old. Performed at [facility], [place] [address], [place], [place] [address]   Culture, blood (routine x 2)     Status: None   Collection Time: 11/30/20  8:14 AM   Specimen: BLOOD RIGHT HAND  Result Value Ref Range Status   Specimen Description BLOOD RIGHT HAND  Final   Special Requests   Final    BOTTLES DRAWN AEROBIC ONLY Blood Culture adequate volume   Culture   Final    NO GROWTH 5 DAYS Performed at [facility], [place] [address], [place], [place] [address]    Report Status 12/05/2020 FINAL  Final  Culture, blood (routine x 2)     Status: None   Collection Time: 11/30/20  8:20 AM   Specimen: BLOOD LEFT HAND  Result Value Ref Range Status   Specimen Description BLOOD LEFT HAND  Final   Special Requests   Final    BOTTLES DRAWN AEROBIC ONLY Blood Culture results may  not be optimal due to an inadequate volume of blood received in culture bottles   Culture   Final    NO GROWTH 5 DAYS Performed at [facility], [place] [address], [place], [place] [address]    Report Status 12/05/2020 FINAL   Final  Surgical PCR screen     Status: Abnormal   Collection Time: 12/03/20  4:50 AM   Specimen: Nasal Mucosa; Nasal Swab  Result Value Ref Range Status   [name], PCR NEGATIVE NEGATIVE Final   Staphylococcus aureus POSITIVE (A) NEGATIVE Final    Comment: (NOTE) The Xpert SA Assay (FDA approved for NASAL specimens in patients 72 years of age and older), is one component of a comprehensive surveillance program. It is not intended to diagnose infection nor to guide or monitor treatment. Performed at [facility], [place] [address], [place], [place] [address]   Resp Panel by RT-PCR (Flu A&B, Covid) Nasopharyngeal Swab     Status: None   Collection Time: 12/04/20  9:16 AM   Specimen: Nasopharyngeal Swab; Nasopharyngeal(NP) swabs in vial transport medium  Result Value Ref Range Status   SARS Coronavirus 2 by RT PCR NEGATIVE NEGATIVE Final    Comment: (NOTE) SARS-CoV-2 target nucleic acids are NOT DETECTED.  The SARS-CoV-2 RNA is generally detectable in upper respiratory specimens during the acute phase of infection. The lowest concentration of SARS-CoV-2 viral copies this assay can detect is 138 copies/mL. A negative result does not preclude SARS-Cov-2 infection and should not be used as the sole basis for treatment or other patient management decisions. A negative result may occur with  improper specimen collection/handling, submission of specimen other than nasopharyngeal swab, presence of viral mutation(s) within the areas targeted by this assay, and inadequate number of viral copies(<138 copies/mL). A negative result must be combined with clinical observations, patient history, and epidemiological information. The expected result is Negative.  Fact Sheet for Patients:  EntrepreneurPulse.com.au  Fact Sheet for Healthcare Providers:  IncredibleEmployment.be  This test is no t yet approved or cleared by the [place] FDA and  has been  authorized for detection and/or diagnosis of SARS-CoV-2 by FDA under an Emergency Use Authorization (EUA). This EUA will remain  in effect (meaning this test can be used) for the duration of the COVID-19 declaration under Section 564(b)(1) of the Act, 21 U.S.C.section 360bbb-3(b)(1), unless the authorization is terminated  or revoked sooner.       Influenza A by PCR NEGATIVE NEGATIVE Final   Influenza B by PCR NEGATIVE NEGATIVE Final    Comment: (NOTE) The Xpert Xpress SARS-CoV-2/FLU/RSV plus assay is intended as an aid in the diagnosis of influenza from Nasopharyngeal swab specimens and should not be used as a sole basis for treatment. Nasal washings and aspirates are unacceptable for Xpert Xpress SARS-CoV-2/FLU/RSV testing.  Fact Sheet for Patients: EntrepreneurPulse.com.au  Fact Sheet for Healthcare Providers: IncredibleEmployment.be  This test is not yet approved or cleared by the [place] FDA and has been authorized for detection and/or diagnosis of SARS-CoV-2 by FDA under an Emergency Use Authorization (EUA). This EUA will remain in effect (meaning this test can be used) for the duration of the COVID-19 declaration under Section 564(b)(1) of the Act, 21 U.S.C. section 360bbb-3(b)(1), unless the authorization is terminated or revoked.  Performed at [facility], [place] [address] [place], [place] [address]      Terri Piedra, [place] [facility]  12/05/2020  11:36 AM

## 2020-12-05 NOTE — Progress Notes (Signed)
Nutrition Follow-up  DOCUMENTATION CODES:   Non-severe (moderate) malnutrition in context of chronic illness  INTERVENTION:   Ensure Enlive po BID, each supplement provides 350 kcal and 20 grams of protein   NUTRITION DIAGNOSIS:   Moderate Malnutrition related to chronic illness (CHF) as evidenced by moderate fat depletion, moderate muscle depletion.  Being addressed via diet advancement, supplements  GOAL:   Patient will meet greater than or equal to 90% of their needs  Progressing  MONITOR:   Vent status, Labs, Weight trends, TF tolerance, I & O's  REASON FOR ASSESSMENT:   Ventilator, Consult Enteral/tube feeding initiation and management  ASSESSMENT:   65 year old male who presented on 7/20 for scheduled RHC/LHC as part of VAD work-up. PMH of CAD s/p previous MI, HTN, HLD, [employer] ICD, CHF, s/p Barostim. Pt admitted after cath with fever, tachycardia, N/V, and acute hypoxemic respiratory failure.  7/20 - intubated 7/21 - TEE 7/23 - Extubated 7/25 - ICD extraction  LVAD workup continues; not candidate currently due to active infection   Currently on Heart Healthy diet;  no recorded po intake. Per RN, pt ate 60% at breakfast this AM.   Current wt 84 kg  Labs: reviewed Meds: lasix, ss novolog, colace   Diet Order:   Diet Order             Diet Heart Room service appropriate? Yes; Fluid consistency: Thin  Diet effective now                   EDUCATION NEEDS:   Not appropriate for education at this time  Skin:  Skin Assessment: Skin Integrity Issues: Skin Integrity Issues:: Stage I Stage I: coccyx  Last BM:  7/24  Height:   Ht Readings from Last 1 Encounters:  12/03/20 6' (1.829 m)    Weight:   Wt Readings from Last 1 Encounters:  12/05/20 84 kg    BMI:  Body mass index is 25.12 kg/m.  Estimated Nutritional Needs:   Kcal:  2100-2300  Protein:  115-135 grams  Fluid:  >/= 2.0 L   Kerman Passey MS, RDN, LDN, CNSC Registered  Dietitian III Clinical Nutrition RD Pager and On-Call Pager Number Located in [place]

## 2020-12-05 NOTE — TOC CM/SW Note (Signed)
TOC CM referral to set up Life Vest. Spoke to [employer], Dorian Pod. Requested order, op note, demographics, and H&P be faxed to #[phone]. Faxed paperwork to start insurance auth. Will discuss with patient and family about [facility] RN. [place], [place] [facility] TOC CM [phone]

## 2020-12-05 NOTE — Discharge Instructions (Addendum)
Wound care instructions (left chest) Keep incision clean and dry , no showering Do not remove dressing Leave steri-strips (little pieces of tape) on until seen in the office for wound check appointment. Call the office [phone]) for redness, drainage, swelling, or fever.     Information on my medicine - ELIQUIS (apixaban)  Why was Eliquis prescribed for you? Eliquis was prescribed to treat blood clots that may have been found in the veins of your legs (deep vein thrombosis) or in your lungs (pulmonary embolism) and to reduce the risk of them occurring again.  What do You need to know about Eliquis ? The dose is ONE 5 mg tablet taken TWICE daily.  Eliquis may be taken with or without food.   Try to take the dose about the same time in the morning and in the evening. If you have difficulty swallowing the tablet whole please discuss with your pharmacist how to take the medication safely.  Take Eliquis exactly as prescribed and DO NOT stop taking Eliquis without talking to the doctor who prescribed the medication.  Stopping may increase your risk of developing a new blood clot.  Refill your prescription before you run out.  After discharge, you should have regular check-up appointments with your healthcare provider that is prescribing your Eliquis.    What do you do if you miss a dose? If a dose of ELIQUIS is not taken at the scheduled time, take it as soon as possible on the same day and twice-daily administration should be resumed. The dose should not be doubled to make up for a missed dose.  Important Safety Information A possible side effect of Eliquis is bleeding. You should call your healthcare provider right away if you experience any of the following: Bleeding from an injury or your nose that does not stop. Unusual colored urine (red or dark brown) or unusual colored stools (red or black). Unusual bruising for unknown reasons. A serious fall or if you hit your head (even  if there is no bleeding).  Some medicines may interact with Eliquis and might increase your risk of bleeding or clotting while on Eliquis. To help avoid this, consult your healthcare provider or pharmacist prior to using any new prescription or non-prescription medications, including herbals, vitamins, non-steroidal anti-inflammatory drugs (NSAIDs) and supplements.  This website has more information on Eliquis (apixaban): http://www.eliquis.com/eliquis/home

## 2020-12-05 NOTE — Progress Notes (Signed)
Progress Note  Patient Name: Brandon [name] Date of Encounter: 12/05/2020  [facility] Cardiologist: [name], MD   Subjective   Feeling well, no CP, not SOB  Inpatient Medications    Scheduled Meds:  sodium chloride   Intravenous Once   apixaban  5 mg Oral BID   atorvastatin  80 mg Oral Daily   Chlorhexidine Gluconate Cloth  6 each Topical Q0600   digoxin  0.125 mg Oral Daily   docusate sodium  100 mg Oral BID   furosemide  40 mg Oral BID   gabapentin  100 mg Oral TID   insulin aspart  0-15 Units Subcutaneous TID WC   insulin aspart  0-5 Units Subcutaneous QHS   melatonin  5 mg Oral QHS   midodrine  5 mg Oral TID WC   mupirocin ointment  1 application Nasal BID   pantoprazole  40 mg Oral Daily   polyethylene glycol  17 g Oral Daily   sodium chloride flush  3 mL Intravenous Q12H   sodium chloride flush  3 mL Intravenous Q12H   Continuous Infusions:  sodium chloride     sodium chloride     sodium chloride Stopped (11/30/20 0631)   cefTRIAXone (ROCEPHIN)  IV 0 g (12/03/20 1650)   PRN Meds: sodium chloride, sodium chloride, acetaminophen (TYLENOL) oral liquid 160 mg/5 mL, acetaminophen, ondansetron (ZOFRAN) IV, oxyCODONE, sodium chloride flush, sodium chloride flush, traZODone   Vital Signs    Vitals:   12/05/20 0800 12/05/20 0900 12/05/20 1000 12/05/20 1100  BP: 117/85 106/77 115/89 106/74  Pulse:      Resp: (!) 25 (!) 26 (!) 31 14  Temp:    (!) 96.3 F (35.7 C)  TempSrc:    Axillary  SpO2: 90% 93% 90% 92%  Weight:      Height:        Intake/Output Summary (Last 24 hours) at 12/05/2020 1149 Last data filed at 12/05/2020 0600 Gross per 24 hour  Intake 1068.57 ml  Output 2550 ml  Net -1481.43 ml   Last 3 Weights 12/05/2020 12/04/2020 12/03/2020  Weight (lbs) 185 lb 3 oz 186 lb 8.2 oz 185 lb 13.6 oz  Weight (kg) 84 kg 84.6 kg 84.3 kg      Telemetry    SR/ST 80's-100's   - Personally Reviewed  ECG    No new EKGs - Personally  Reviewed  Physical Exam   GEN: No acute distress.   Neck: No JVD Cardiac: RRR, no murmurs, rubs, or gallops.  Respiratory: CTA b/l. GI: Soft, nontender, non-distended  MS: trace LE edema L>R, RUE edema, No deformity. Neuro:  Nonfocal  Psych: Normal affect   L chest site: is dry, no hematoma, no ecchymosis.  Steri strips/sutites in place  Labs    High Sensitivity Troponin:   Recent Labs  Lab 11/30/20 0144 11/30/20 0522  TROPONINIHS 3,550* 4,843*      Chemistry Recent Labs  Lab 11/29/20 1310 11/29/20 2221 11/30/20 0833 11/30/20 0839 12/03/20 0450 12/04/20 0352 12/05/20 0454  NA 133*   < >  --    < > 135 131* 135  K 2.9*   < >  --    < > 3.4* 3.8 4.0  CL 97*   < >  --    < > 104 100 100  CO2 24   < >  --    < > 24 25 27   GLUCOSE 111*   < >  --    < >  93 127* 115*  BUN 11   < >  --    < > 21 23 23   CREATININE 0.89   < >  --    < > 0.77 0.73 0.77  CALCIUM 8.7*   < >  --    < > 8.0* 8.1* 8.1*  PROT 7.4  --  6.3*  --   --   --   --   ALBUMIN 3.0*  --  2.3*  --   --   --   --   AST 36  --  63*  --   --   --   --   ALT 56*  --  70*  --   --   --   --   ALKPHOS 71  --  66  --   --   --   --   BILITOT 1.1  --  1.3*  --   --   --   --   GFRNONAA >60   < >  --    < > >60 >60 >60  ANIONGAP 12   < >  --    < > 7 6 8    < > = values in this interval not displayed.     Hematology Recent Labs  Lab 12/03/20 0450 12/04/20 0352 12/05/20 0454  WBC 12.7* 9.2 10.5  RBC 3.12* 2.87* 3.11*  HGB 8.9* 8.3* 9.1*  HCT 27.3* 25.0* 27.1*  MCV 87.5 87.1 87.1  MCH 28.5 28.9 29.3  MCHC 32.6 33.2 33.6  RDW 14.8 14.8 15.0  PLT 147* 145* 175    BNP Recent Labs  Lab 11/29/20 2240 11/30/20 0144  BNP 1,586.1* 2,653.1*     DDimer No results for input(s): DDIMER in the last 168 hours.   Radiology    DG Chest Port 1 View  Result Date: 12/04/2020 CLINICAL DATA:  ICD in place EXAM: PORTABLE CHEST 1 VIEW COMPARISON:  12/01/2020 FINDINGS: Left-sided ICD has been removed. No  evidence of retained component. Stimulator on the right, extending into the right neck appears unchanged. Right internal jugular central line tip in the SVC above the right atrium. Mild interstitial edema persists. Small amount of bilateral pleural fluid. No pneumothorax. IMPRESSION: Previously seen ICD has been removed. No evidence of retained components. Right-sided stimulator remains in place. Right IJ catheter remains in place. Mild interstitial edema and small effusions. No pneumothorax per Electronically Signed   By: Nelson Chimes M.D.   On: 12/04/2020 20:52   [place] EKG SITE RITE  Result Date: 12/05/2020 If Site Rite image not attached, placement could not be confirmed due to current cardiac rhythm.  HYBRID OR IMAGING ([facility] ONLY)  Result Date: 12/04/2020 There is no interpretation for this exam.  This order is for images obtained during a surgical procedure.  Please See "Surgeries" Tab for more information regarding the procedure.    Cardiac Studies    11/30/20: TEE IMPRESSIONS   1. Left ventricular ejection fraction, by estimation, is 20%. The left  ventricle has severely decreased function. The left ventricle demonstrates  global hypokinesis. The left ventricular internal cavity size was mildly  dilated.   2. No vegetation on ICD lead. Right ventricular systolic function is  mildly reduced. The right ventricular size is normal.   3. Left atrial size was severely dilated. No left atrial/left atrial  appendage thrombus was detected.   4. Right atrial size was mildly dilated.   5. The mitral valve is normal in structure. Mild mitral  valve  regurgitation.   6. Aortic valve is severely calcified with markedly decresed cusp  excursion. Likely low-flow low gradient AS with mean gradient64mm HG. VR =  0.19. VTI = 7.5. There is a nodular density on the aortic side of the  valve on the [place] that appears stable from  recent CT. 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 The aortic valve is tricuspid. There is severe calcifcation of the  aortic valve. Aortic valve regurgitation is mild. Severe aortic valve  stenosis.   7. There is Severe (Grade IV) plaque involving the descending aorta.   Patient Profile     65 y.o. male w/PMHx of CAD, ICM, chronic CHF (systolic), ICD, barostim, HTN, HLD admitted 7/20 for outpatient [facility] as part of VAD w/u. Post procedure, developed severe rigors, n/v and fever to 102 then developed acute hypoxic respiratory failure, requiring intubation and pressors(mixed cardiogenic/septic shock)  Blood cx + for Strep gordonii - Repeat cx 7/21 NGTD  7/22 CT C/A/P notable for aspiration PNA. No evidence septic emboli Extubated 7/23 Off pressors  12/03/20: NEW acute DVT RUE  Device SJM single chamber ICD [place], [place] 7122 lead System implanted 4/120/2021  Assessment & Plan    Bacteremia Strep species New RUE DVT (and superficial thromb as well)  2. ICM Hx of chronic CHF 9systolic) with thoughts towards advanced therapies eventually S/p ICD system extraction yesterday with Dr. Quentin Ore Site is stable no bleeding or hematoma Sutures/steri strips are in place Pressure dressing applied, pt tolerated well Will plan to keep pressure dressing in place with a/c is being started (for his DVT)  He will need Life vest at time of discharge, pt is aware  Wound check and EP follow up is in place  Will follow from [place] and remove pressure dressing in 48 hours or prior to discharge If you need EP service to arrange ife vest please let me know  Dr. Quentin Ore has seen and exmained the patient this AM    For questions or updates, please contact [facility] Please consult [email or web address] for contact info under        Signed, Baldwin [place], PA-C  12/05/2020, 11:49 AM

## 2020-12-05 NOTE — TOC Initial Note (Signed)
Transition of Care [facility]) - Initial/Assessment Note    Patient Details  Name: Brandon [name] MRN: [id number] Date of Birth: 02/03/1956  [facility]) CM/SW Contact:    Erenest Rasher, RN Phone Number: [phone] 12/05/2020, 3:59 PM  Clinical Narrative:                  TOC CM spoke to pt, brother and sister, Patty at bedside. Pt lives alone but states he can get assistance from neighbor and sister to help with IV abx at home. Offered choice for [facility]. Agreeable to [facility] and [place]. Explained IV Infusion RN, Pam will educate on IV abx at home. Waiting insurance auth on Life Vest. Explained [employer] rep will deliver and educate on device. Pt reports having a can. Will PT evaluation prior to dc.   States he was driving to appt prior to hospital stay. Will have family assist with getting to appts.     Expected Discharge Plan: [place] Barriers to Discharge: Continued Medical Work up   Patient Goals and CMS Choice Patient states their goals for this hospitalization and ongoing recovery are:: wants to remain independent CMS Medicare.gov Compare Post Acute Care list provided to:: Patient Choice offered to / list presented to : Patient  Expected Discharge Plan and Services Expected Discharge Plan: [place] In-house Referral: Clinical Social Work Discharge Planning Services: CM Consult Post Acute Care Choice: [place] arrangements for the past 2 months: [place] Expected Discharge Date: 11/29/20                         HH Arranged: RN [place] Agency: [place] ([facility] Date HH Agency Contacted: 12/05/20   Representative spoke with at [place]: Carolynn Sayers, [facility] IV Infusion RN, Wylene Men RN  Prior Living Arrangements/Services Living arrangements for the past 2 months: [place] Lives with:: Self   Do you feel safe going back to the place where you live?: Yes       Need for Family Participation in Patient Care: Yes (Comment) Care giver support system in place?: Yes (comment) Current home services: DME (cane) Criminal Activity/Legal Involvement Pertinent to Current Situation/Hospitalization: No - Comment as needed  Activities of Daily Living      Permission Sought/Granted Permission sought to share information with : Case Manager, PCP, Family Supports Permission granted to share information with : Yes, Verbal Permission Granted  Share Information with NAME: [place] granted to share info w AGENCY: [place] granted to share info w Relationship: sister  Permission granted to share info w Contact Information: [phone]  Emotional Assessment Appearance:: Appears stated age Attitude/Demeanor/Rapport: Gracious, Engaged Affect (typically observed): Accepting Orientation: : Oriented to Self, Oriented to Place, Oriented to  Time, Oriented to Situation   Psych Involvement: No (comment)  Admission diagnosis:  Acute respiratory failure (HCC) [J96.00] Patient Active Problem List   Diagnosis Date Noted   ICD (implantable cardioverter-defibrillator) in place    Dental caries    Malnutrition of moderate degree 12/01/2020   Pressure injury of skin 12/01/2020   Cholelithiases    Aortic valve endocarditis    Pleural effusion    Aspiration into airway    Elevated brain natriuretic peptide (BNP) level 11/30/2020   Elevated troponin 11/30/2020   Hypokalemia 11/30/2020   Hypomagnesemia 11/30/2020   Normocytic anemia 11/30/2020  Dyspnea    Respiratory failure ([place])    History of ETT    Infected defibrillator [facility])    Streptococcal bacteremia    Central line infection    Acute respiratory failure with hypoxia and hypercapnia ([place]) 11/29/2020   Centrilobular emphysema ([place]) 08/09/2020   Benign neoplasm of colon    Heart failure ([place]) 12/31/2019   Ischemic cardiomyopathy 08/31/2019   Cardiogenic shock (HCC)     STEMI (ST elevation myocardial infarction) ([place]) 04/27/2019   Acute anterior wall MI ([place]) 04/27/2019   Mild aortic stenosis 12/02/2012   Ankylosis of sacroiliac joint 03/08/2011   TOBACCO ABUSE 03/13/2009   Hyperlipidemia with target LDL less than 70 06/07/2008   Essential hypertension 06/07/2008   Coronary atherosclerosis 06/07/2008   BENIGN PROSTATIC HYPERTROPHY 06/07/2008   History of colon polyps 06/07/2008   NEPHROLITHIASIS, HX OF 06/07/2008   PCP:  [name], MD Pharmacy:   [id number], [place]. [place] [address] Phone: [phone] Fax: [phone]  [id number] [place] [place] - [address] [place] [address] Phone: [phone] Fax: [phone]  [facility] [place] [id number] [place] [address] Phone: [phone] Fax: [place], [address] [place] [address] Phone: [phone] Fax: [phone]     Social Determinants of Health (SDOH) Interventions    Readmission Risk Interventions No flowsheet data found.

## 2020-12-06 ENCOUNTER — Encounter (HOSPITAL_COMMUNITY): Payer: Self-pay | Admitting: Internal Medicine

## 2020-12-06 ENCOUNTER — Inpatient Hospital Stay (HOSPITAL_COMMUNITY): Payer: Medicare Other

## 2020-12-06 DIAGNOSIS — I5022 Chronic systolic (congestive) heart failure: Secondary | ICD-10-CM | POA: Diagnosis not present

## 2020-12-06 DIAGNOSIS — J9601 Acute respiratory failure with hypoxia: Secondary | ICD-10-CM | POA: Diagnosis not present

## 2020-12-06 DIAGNOSIS — T17908D Unspecified foreign body in respiratory tract, part unspecified causing other injury, subsequent encounter: Secondary | ICD-10-CM | POA: Diagnosis not present

## 2020-12-06 DIAGNOSIS — R570 Cardiogenic shock: Secondary | ICD-10-CM | POA: Diagnosis not present

## 2020-12-06 DIAGNOSIS — I358 Other nonrheumatic aortic valve disorders: Secondary | ICD-10-CM | POA: Diagnosis not present

## 2020-12-06 LAB — CBC
HCT: 29.8 % — ABNORMAL LOW (ref 39.0–52.0)
Hemoglobin: 9.6 g/dL — ABNORMAL LOW (ref 13.0–17.0)
MCH: 28 pg (ref 26.0–34.0)
MCHC: 32.2 g/dL (ref 30.0–36.0)
MCV: 86.9 fL (ref 80.0–100.0)
Platelets: 195 10*3/uL (ref 150–400)
RBC: 3.43 MIL/uL — ABNORMAL LOW (ref 4.22–5.81)
RDW: 15 % (ref 11.5–15.5)
WBC: 9.8 10*3/uL (ref 4.0–10.5)
nRBC: 0 % (ref 0.0–0.2)

## 2020-12-06 LAB — COOXEMETRY PANEL
Carboxyhemoglobin: 1.1 % (ref 0.5–1.5)
Carboxyhemoglobin: 1 % (ref 0.5–1.5)
Methemoglobin: 1.1 % (ref 0.0–1.5)
Methemoglobin: 1.2 % (ref 0.0–1.5)
O2 Saturation: 32.3 %
O2 Saturation: 17.1 %
Total hemoglobin: 10.5 g/dL — ABNORMAL LOW (ref 12.0–16.0)
Total hemoglobin: 10.9 g/dL — ABNORMAL LOW (ref 12.0–16.0)

## 2020-12-06 LAB — BASIC METABOLIC PANEL
Anion gap: 5 (ref 5–15)
BUN: 20 mg/dL (ref 8–23)
CO2: 30 mmol/L (ref 22–32)
Calcium: 8.1 mg/dL — ABNORMAL LOW (ref 8.9–10.3)
Chloride: 100 mmol/L (ref 98–111)
Creatinine, Ser: 0.85 mg/dL (ref 0.61–1.24)
GFR, Estimated: >60 mL/min (ref >60)
Glucose, Bld: 99 mg/dL (ref 70–99)
Potassium: 4.1 mmol/L (ref 3.5–5.1)
Sodium: 135 mmol/L (ref 135–145)

## 2020-12-06 LAB — GLUCOSE, CAPILLARY
Glucose-Capillary: 107 mg/dL — ABNORMAL HIGH (ref 70–99)
Glucose-Capillary: 160 mg/dL — ABNORMAL HIGH (ref 70–99)
Glucose-Capillary: 78 mg/dL (ref 70–99)
Glucose-Capillary: 93 mg/dL (ref 70–99)

## 2020-12-06 LAB — MAGNESIUM: Magnesium: 1.9 mg/dL (ref 1.7–2.4)

## 2020-12-06 MED ORDER — DAPAGLIFLOZIN PROPANEDIOL 10 MG PO TABS
10.0000 mg | ORAL_TABLET | Freq: Every day | ORAL | Status: DC
  Administered 2020-12-06 – 2020-12-11 (×6): 10 mg via ORAL
  Filled 2020-12-06 (×6): qty 1

## 2020-12-06 NOTE — Progress Notes (Addendum)
Patient ID: Brandon [name], male   DOB: [birth year], 65 y.o.   MRN: [id number]     Advanced Heart Failure Rounding Note  PCP-Cardiologist: [name], MD  [facility]: Dr. 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 Post procedure, developed severe rigors, n/v and fever to 102 then developed acute hypoxic respiratory failure, requiring intubation - 7/21 Blood Cx + for Strep. 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 Mean gradient 18. VR = 0.19 VTI = 7.5. RV mild HK - 7/22 CT C/A/P notable for aspiration PNA. No evidence septic emboli - Extubated 7/23 - Rt UE DVT 7/24 - ICD Extraction 7/25  Subjective:    Feels ok this morning. No dyspnea. AF/ WBC normal.   F/u cultures pending.   Lifevest delivered last night.   Objective:   Weight Range: 86 kg Body mass index is 25.71 kg/m.   Vital Signs:   Temp:  [96.3 F (35.7 C)-98 F (36.7 C)] 97.7 F (36.5 C) (07/27 0729) Pulse Rate:  [85-117] 100 (07/27 0729) Resp:  [14-31] 20 (07/27 0729) BP: (96-116)/(70-89) 103/70 (07/27 0729) SpO2:  [90 %-96 %] 96 % (07/27 0729) Weight:  [86 kg] 86 kg (07/27 0401) Last BM Date: 12/03/20  Weight change: Filed Weights   12/04/20 0600 12/05/20 0500 12/06/20 0401  Weight: 84.6 kg 84 kg 86 kg    Intake/Output:   Intake/Output Summary (Last 24 hours) at 12/06/2020 0917 Last data filed at 12/06/2020 0549 Gross per 24 hour  Intake 240 ml  Output 1800 ml  Net -1560 ml      Physical Exam   General:  Well appearing. No respiratory difficulty HEENT: normal Neck: supple. no JVD. Carotids 2+ bilat; no bruits. No  lymphadenopathy or thyromegaly appreciated. Cor: PMI nondisplaced. Regular rate & rhythm. No rubs, gallops or murmurs. Lungs: clear Abdomen: soft, nontender, nondistended. No hepatosplenomegaly. No bruits or masses. Good bowel sounds. Extremities: no cyanosis, clubbing, rash, edema Neuro: alert & oriented x 3, cranial nerves grossly intact. moves all 4 extremities w/o difficulty. Affect pleasant.   Telemetry   Sinus tach low 100s. Personally reviewed   Labs    CBC Recent Labs    12/05/20 0454 12/06/20 0056  WBC 10.5 9.8  HGB 9.1* 9.6*  HCT 27.1* 29.8*  MCV 87.1 86.9  PLT 175 474   Basic Metabolic Panel Recent Labs    12/04/20 0352 12/05/20 0454 12/06/20 0056  NA 131* 135 135  K 3.8 4.0 4.1  CL 100 100 100  CO2 _0 GLUCOSE 127* 115* 99  BUN _1 CREATININE 0.73 0.77 0.85  CALCIUM 8.1* 8.1* 8.1*  MG 2.1  --  1.9  PHOS 2.7  --   --    Liver Function Tests No results for input(s): AST, ALT, ALKPHOS, BILITOT, PROT, ALBUMIN in the last 72 hours.  No results for input(s): LIPASE, AMYLASE in the last 72 hours. Cardiac Enzymes No results for input(s): CKTOTAL, CKMB, CKMBINDEX, TROPONINI in the last 72 hours.  BNP: BNP (last 3 results) Recent Labs    11/01/20 1224 11/29/20 2240  11/30/20 0144  BNP 462.1* 1,586.1* 2,653.1*    ProBNP (last 3 results) Recent Labs    03/23/20 0922  PROBNP 520*     D-Dimer No results for input(s): DDIMER in the last 72 hours. Hemoglobin A1C No results for input(s): HGBA1C in the last 72 hours.  Fasting Lipid Panel No results for input(s): CHOL, HDL, LDLCALC, TRIG, CHOLHDL, LDLDIRECT in the last 72 hours. Thyroid Function Tests No results for input(s): TSH, T4TOTAL, T3FREE, THYROIDAB in the last 72 hours.  Invalid input(s): FREET3  Other results:   Imaging    No results found.   Medications:     Scheduled Medications:  sodium chloride   Intravenous Once   apixaban  5 mg Oral BID   atorvastatin   80 mg Oral Daily   Chlorhexidine Gluconate Cloth  6 each Topical Q0600   digoxin  0.125 mg Oral Daily   docusate sodium  100 mg Oral BID   furosemide  40 mg Oral BID   gabapentin  100 mg Oral TID   insulin aspart  0-15 Units Subcutaneous TID WC   insulin aspart  0-5 Units Subcutaneous QHS   melatonin  5 mg Oral QHS   midodrine  5 mg Oral TID WC   mupirocin ointment  1 application Nasal BID   pantoprazole  40 mg Oral Daily   polyethylene glycol  17 g Oral Daily   sodium chloride flush  3 mL Intravenous Q12H   sodium chloride flush  3 mL Intravenous Q12H    Infusions:  sodium chloride     sodium chloride     sodium chloride Stopped (11/30/20 0631)   cefTRIAXone (ROCEPHIN)  IV 2 g (12/05/20 1550)    PRN Medications: sodium chloride, sodium chloride, acetaminophen (TYLENOL) oral liquid 160 mg/5 mL, acetaminophen, ondansetron (ZOFRAN) IV, oxyCODONE, sodium chloride flush, sodium chloride flush, traZODone    Patient Profile   65 year old with a history of h/o CAD s/p previous MI, HTN, HL, ST jude ICD, S/P Barostim, and chronic systolic heart failure, EF 20-25%.  Presented 7/20 for outpatient RHC as part of VAD w/u. Post procedure, developed severe rigors, n/v and fever to 102 several hours after cardiac catheterization while in short stay. IVFs given for hypotension, labs and blood cx drawn. Admitted to ICU and started on broad spectrum  abx. Later developed acute hypoxic respiratory failure, requiring intubation. Blood Cx + for Strep.   TEE w/ with no evidence of endocarditis. 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 Likely low-flow low gradient AS. Mean gradient 18. VR = 0.19 VTI = 7.5. LVEF 20%. RV mild HK  Assessment/Plan   Strep Bacteremia - Blood cx + for Strep gordonii - Repeat cx 7/21 NGTD  - TEE w/ with no clear evidence of endocarditis (fibroelastoma on Aov -> cannot completely exclude infection)  - ID has seen - now on ceftiraxone.   - ICD extracted 7/25. Per EP (ok to leave Baromstim  as it is extravascular) -  CVC removed 7/26 - Awaiting repeat culture data. If no growth tomorrow, will place PICC line   2. Septic Shock  - in setting of bacteremia and aspiration PNA - c/w abx per ID - see above - Now off pressors and stable. No central access currently   3. Acute Hypoxic Respiratory Failure - Resolved/extubated.  4. Chronic Systolic Heart Failure, ICM  - Echo 12/20 EF post anterior MI 30 to 35%. - Echo 6/21 EF 25-30% RV ok  - Echo 05/19/20 EF 20-25% RV  mildly HK. moderate AS  Mean gradient 13 AVA 1.2 cm2 DI 0.30 - Initial RHC this admit with low filling pressures. CI marginal at 2.3 - hold eplerenone, entresto, farxiga, and carvedilol w/ soft BP  - c/w digoxin 0.125  - Continue PO Lasix 40 bid  - C/w Midodrine 5 tid for BP support  - concerned he will need advanced therpaies. Likely not transplant candidate given age and lung disease. Being elevated for LVAD, but currently not a candidate for implant given active infection/ bacteremia.  - Plan ICD extraction, 6 weeks IV abx. If surveillance cultures clear x 4 weeks can consider VAD implant.  - Home w/ LifeVest  5. CAD/NSTEMI  - s/p anterior STEMI 12/20. LHC showed chronically occluded RCA (with L>>R collaterals) and thrombotic occlusion of proximal LAD. Underwent PCI of LAD. - LHC this admit with stable CAD - Had NSTEMI in setting of septic shock - Continue ASA/statin.   6. Aortic Stenosis - AS is severe on TEE.  - Likely low-flow low gradient severe AS. Mean gradient 18. DI = 0.19 VTI = 7.5 - Presence of fibroelastoma prevents TAVR  7. RUE DVT  - on Eliquis    Length of Stay: [address], PA-C  12/06/2020, 9:17 AM  Advanced Heart Failure Team Pager [phone] (M-F; 7a - 5p)  Please contact [facility] for night-coverage after hours (5p -7a ) and weekends on [email or web address]  Patient seen with PA, agree with the above note.   Doing well today, no dyspnea.  SBP 90s, no lightheadedness.     General: NAD Neck: No JVD, no thyromegaly or thyroid nodule.  Lungs: Clear to auscultation bilaterally with normal respiratory effort. CV: Nondisplaced PMI.  Heart regular S1/S2, no S3/S4, no murmur.  No peripheral edema.   Abdomen: Soft, nontender, no hepatosplenomegaly, no distention.  Skin: Intact without lesions or rashes.  Neurologic: Alert and oriented x 3.  Psych: Normal affect. Extremities: No clubbing or cyanosis.  HEENT: Normal.   OK for PICC tomorrow per ID, then 6 wks ceftriaxone.   Less swelling RUE, continue Eliquis for RUE DVT.   Volume stable, continue Lasix 40 mg daily. Continue digoxin and can restart dapagliflozin 10 mg daily.   ICD out, will get Lifevest at discharge.   Loralie Champagne 12/06/2020 10:52 AM

## 2020-12-06 NOTE — Progress Notes (Signed)
[facility]  Date of Admission:  11/29/2020     Total days of antibiotics 8         ASSESSMENT:  [name] continues to improve and remains clinically stable. Central line removed and cultures drawn yesterday. If negative tomorrow okay for PICC line placement. Having increased pain in his right arm in the area of the DVT as a result of him decreasing pain medication yesterday. Continue to monitor blood cultures from 12/05/20 and continue with ceftriaxone with planned end date of 01/15/21.   PLAN:  Continue current dose of ceftriaxone.  PICC line placement if cultures remain negative tomorrow.  OPAT/Home Health orders placed. Monitor cultures for continued clearance of bacteremia.    Diagnosis:  Streptococcus gordonii bacteremia with concern for ICD infection s/p removal   Culture Result: Streptococcus gordonii  No Known Allergies  OPAT Orders Discharge antibiotics to be given via PICC line Discharge antibiotics: Ceftriaxone Per pharmacy protocol  Aim for Vancomycin trough 15-20 or AUC 400-550 (unless otherwise indicated) Duration: 6 weeks  End Date: 01/15/21  [facility] Care Per Protocol:  Home health RN for IV administration and teaching; PICC line care and labs.    Labs weekly while on IV antibiotics: _X_ CBC with differential __X BMP __ CMP _X_ CRP _X_ ESR __ Vancomycin trough __ CK  _X_ Please pull PIC at completion of IV antibiotics __ Please leave PIC in place until doctor has seen patient or been notified  Fax weekly labs to [phone]  Clinic Follow Up Appt:  12/27/20 with Dr. 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[facility])   Streptococcal bacteremia   Central line infection   Cholelithiases   Aortic valve endocarditis   Pleural effusion   Aspiration into airway   Malnutrition of moderate degree   Pressure injury of skin   Dental caries   ICD (implantable cardioverter-defibrillator) in place    sodium chloride   Intravenous Once   apixaban  5 mg Oral BID   atorvastatin  80 mg Oral Daily   Chlorhexidine Gluconate Cloth  6 each Topical Q0600   dapagliflozin propanediol  10 mg Oral Daily   digoxin  0.125 mg Oral Daily   docusate sodium  100 mg Oral BID   furosemide  40 mg Oral BID   gabapentin  100 mg Oral TID   insulin aspart  0-15 Units Subcutaneous TID WC   insulin aspart  0-5 Units Subcutaneous QHS   melatonin  5 mg Oral QHS   midodrine  5 mg Oral TID WC   mupirocin ointment  1 application Nasal BID   pantoprazole  40 mg Oral Daily   polyethylene glycol  17 g Oral Daily   sodium chloride flush  3 mL Intravenous Q12H   sodium chloride flush  3 mL Intravenous Q12H    SUBJECTIVE:  Afebrile overnight with no acute events. Tried to decrease pain medication and having some increased pain levels. Has questions about DVT treatment and plan of care. Denies fevers/chills.   No Known Allergies   Review of Systems: Review of Systems  Constitutional:  Negative for chills,  fever and weight loss.  Respiratory:  Negative for cough, shortness of breath and wheezing.   Cardiovascular:  Negative for chest pain and leg swelling.  Gastrointestinal:  Negative for abdominal pain, constipation, diarrhea, nausea and vomiting.  Skin:  Negative for rash.     OBJECTIVE: Vitals:   12/06/20 0401 12/06/20 0442 12/06/20 0729 12/06/20 0951  BP: 96/70  103/70   Pulse: 97 85 100 (!) 104  Resp: (!) _0 Temp: 97.9 F (36.6 C)  97.7 F (36.5 C)   TempSrc: Oral  Oral   SpO2: 94% 94% 96%   Weight: 86 kg     Height:       Body mass index is 25.71 kg/m.  Physical  Exam Constitutional:      General: He is not in acute distress.    Appearance: He is well-developed.  Cardiovascular:     Rate and Rhythm: Normal rate and regular rhythm.     Heart sounds: Normal heart sounds.  Pulmonary:     Effort: Pulmonary effort is normal.     Breath sounds: Normal breath sounds.  Skin:    General: Skin is warm and dry.  Neurological:     Mental Status: He is alert and oriented to person, place, and time.  Psychiatric:        Mood and Affect: Mood normal.    Lab Results Lab Results  Component Value Date   WBC 9.8 12/06/2020   HGB 9.6 (L) 12/06/2020   HCT 29.8 (L) 12/06/2020   MCV 86.9 12/06/2020   PLT 195 12/06/2020    Lab Results  Component Value Date   CREATININE 0.85 12/06/2020   BUN 20 12/06/2020   NA 135 12/06/2020   K 4.1 12/06/2020   CL 100 12/06/2020   CO2 30 12/06/2020    Lab Results  Component Value Date   ALT 70 (H) 11/30/2020   AST 63 (H) 11/30/2020   ALKPHOS 66 11/30/2020   BILITOT 1.3 (H) 11/30/2020     Microbiology: Recent Results (from the past 240 hour(s))  Culture, blood (routine x 2)     Status: Abnormal   Collection Time: 11/29/20 12:54 PM   Specimen: BLOOD  Result Value Ref Range Status   Specimen Description BLOOD SITE NOT SPECIFIED  Final   Special Requests   Final    BOTTLES DRAWN AEROBIC AND ANAEROBIC Blood Culture adequate volume   Culture  Setup Time   Final    GRAM POSITIVE COCCI IN CHAINS IN BOTH AEROBIC AND ANAEROBIC BOTTLES CRITICAL VALUE NOTED.  VALUE IS CONSISTENT WITH PREVIOUSLY REPORTED AND CALLED VALUE.    Culture (A)  Final    STREPTOCOCCUS GORDONII SUSCEPTIBILITIES PERFORMED ON PREVIOUS CULTURE WITHIN THE LAST 5 DAYS. Performed at [facility], [place] [address], [place], [place] [address]    Report Status 12/02/2020 FINAL  Final  Culture, blood (routine x 2)     Status: Abnormal   Collection Time: 11/29/20  1:04 PM   Specimen: BLOOD RIGHT HAND  Result Value Ref Range Status   Specimen  Description BLOOD RIGHT HAND  Final   Special Requests   Final    BOTTLES DRAWN AEROBIC AND ANAEROBIC Blood Culture results may not be optimal due to an excessive volume of blood received in culture bottles   Culture  Setup Time   Final    GRAM POSITIVE COCCI IN CHAINS IN BOTH AEROBIC AND ANAEROBIC BOTTLES CRITICAL RESULT CALLED TO, READ BACK BY AND  VERIFIED WITH: C. AMEND,PHARMD [id number] 11/30/2020 Mena Goes Performed at [facility], [place] [address], [place], [place] [address]    Culture STREPTOCOCCUS GORDONII (A)  Final   Report Status 12/02/2020 FINAL  Final   Organism ID, Bacteria STREPTOCOCCUS GORDONII  Final      Susceptibility   Streptococcus gordonii - MIC*    PENICILLIN <=0.06 SENSITIVE Sensitive     CEFTRIAXONE <=0.12 SENSITIVE Sensitive     ERYTHROMYCIN <=0.12 SENSITIVE Sensitive     LEVOFLOXACIN 1 SENSITIVE Sensitive     VANCOMYCIN 0.5 SENSITIVE Sensitive     * STREPTOCOCCUS GORDONII  Blood Culture ID Panel (Reflexed)     Status: Abnormal   Collection Time: 11/29/20  1:04 PM  Result Value Ref Range Status   Enterococcus faecalis NOT DETECTED NOT DETECTED Final   Enterococcus Faecium NOT DETECTED NOT DETECTED Final   Listeria monocytogenes NOT DETECTED NOT DETECTED Final   Staphylococcus species NOT DETECTED NOT DETECTED Final   Staphylococcus aureus (BCID) NOT DETECTED NOT DETECTED Final   Staphylococcus epidermidis NOT DETECTED NOT DETECTED Final   Staphylococcus lugdunensis NOT DETECTED NOT DETECTED Final   Streptococcus species DETECTED (A) NOT DETECTED Final    Comment: Not Enterococcus species, Streptococcus agalactiae, Streptococcus pyogenes, or Streptococcus pneumoniae. CRITICAL RESULT CALLED TO, READ BACK BY AND VERIFIED WITH: C. AMEND,PHARMD [id number] 11/30/2020 T. TYSOR    Streptococcus agalactiae NOT DETECTED NOT DETECTED Final   Streptococcus pneumoniae NOT DETECTED NOT DETECTED Final   Streptococcus pyogenes NOT DETECTED NOT DETECTED Final    A.calcoaceticus-baumannii NOT DETECTED NOT DETECTED Final   Bacteroides fragilis NOT DETECTED NOT DETECTED Final   Enterobacterales NOT DETECTED NOT DETECTED Final   Enterobacter cloacae complex NOT DETECTED NOT DETECTED Final   Escherichia coli NOT DETECTED NOT DETECTED Final   Klebsiella aerogenes NOT DETECTED NOT DETECTED Final   Klebsiella oxytoca NOT DETECTED NOT DETECTED Final   Klebsiella pneumoniae NOT DETECTED NOT DETECTED Final   Proteus species NOT DETECTED NOT DETECTED Final   Salmonella species NOT DETECTED NOT DETECTED Final   Serratia marcescens NOT DETECTED NOT DETECTED Final   Haemophilus influenzae NOT DETECTED NOT DETECTED Final   Neisseria meningitidis NOT DETECTED NOT DETECTED Final   Pseudomonas aeruginosa NOT DETECTED NOT DETECTED Final   Stenotrophomonas maltophilia NOT DETECTED NOT DETECTED Final   Candida albicans NOT DETECTED NOT DETECTED Final   Candida auris NOT DETECTED NOT DETECTED Final   Candida glabrata NOT DETECTED NOT DETECTED Final   Candida krusei NOT DETECTED NOT DETECTED Final   Candida parapsilosis NOT DETECTED NOT DETECTED Final   Candida tropicalis NOT DETECTED NOT DETECTED Final   Cryptococcus neoformans/gattii NOT DETECTED NOT DETECTED Final    Comment: Performed at [facility], [address] [place], [place] [address]  [name] Next Gen by PCR, Nasal     Status: None   Collection Time: 11/29/20  2:01 PM  Result Value Ref Range Status   [name] by PCR Next Gen NOT DETECTED NOT DETECTED Final    Comment: (NOTE) The GeneXpert [name] Assay (FDA approved for NASAL specimens only), is one component of a comprehensive [name] colonization surveillance program. It is not intended to diagnose [name] infection nor to guide or monitor treatment for [name] infections. Test performance is not FDA approved in patients less than 51 years old. Performed at [facility], [place] [address], [place], [place] [address]   Culture, blood (routine x 2)      Status: None  Collection Time: 11/30/20  8:14 AM   Specimen: BLOOD RIGHT HAND  Result Value Ref Range Status   Specimen Description BLOOD RIGHT HAND  Final   Special Requests   Final    BOTTLES DRAWN AEROBIC ONLY Blood Culture adequate volume   Culture   Final    NO GROWTH 5 DAYS Performed at [facility], [name], [place], [place] [address]    Report Status 12/05/2020 FINAL  Final  Culture, blood (routine x 2)     Status: None   Collection Time: 11/30/20  8:20 AM   Specimen: BLOOD LEFT HAND  Result Value Ref Range Status   Specimen Description BLOOD LEFT HAND  Final   Special Requests   Final    BOTTLES DRAWN AEROBIC ONLY Blood Culture results may not be optimal due to an inadequate volume of blood received in culture bottles   Culture   Final    NO GROWTH 5 DAYS Performed at [facility], [place] [address], [place], [place] [address]    Report Status 12/05/2020 FINAL  Final  Surgical PCR screen     Status: Abnormal   Collection Time: 12/03/20  4:50 AM   Specimen: Nasal Mucosa; Nasal Swab  Result Value Ref Range Status   [name], PCR NEGATIVE NEGATIVE Final   Staphylococcus aureus POSITIVE (A) NEGATIVE Final    Comment: (NOTE) The Xpert SA Assay (FDA approved for NASAL specimens in patients 54 years of age and older), is one component of a comprehensive surveillance program. It is not intended to diagnose infection nor to guide or monitor treatment. Performed at [facility], [place] [address] [place], [place] [address]   Resp Panel by RT-PCR (Flu A&B, Covid) Nasopharyngeal Swab     Status: None   Collection Time: 12/04/20  9:16 AM   Specimen: Nasopharyngeal Swab; Nasopharyngeal(NP) swabs in vial transport medium  Result Value Ref Range Status   SARS Coronavirus 2 by RT PCR NEGATIVE NEGATIVE Final    Comment: (NOTE) SARS-CoV-2 target nucleic acids are NOT DETECTED.  The SARS-CoV-2 RNA is generally detectable in upper respiratory specimens during the  acute phase of infection. The lowest concentration of SARS-CoV-2 viral copies this assay can detect is 138 copies/mL. A negative result does not preclude SARS-Cov-2 infection and should not be used as the sole basis for treatment or other patient management decisions. A negative result may occur with  improper specimen collection/handling, submission of specimen other than nasopharyngeal swab, presence of viral mutation(s) within the areas targeted by this assay, and inadequate number of viral copies(<138 copies/mL). A negative result must be combined with clinical observations, patient history, and epidemiological information. The expected result is Negative.  Fact Sheet for Patients:  EntrepreneurPulse.com.au  Fact Sheet for Healthcare Providers:  IncredibleEmployment.be  This test is no t yet approved or cleared by the [place] FDA and  has been authorized for detection and/or diagnosis of SARS-CoV-2 by FDA under an Emergency Use Authorization (EUA). This EUA will remain  in effect (meaning this test can be used) for the duration of the COVID-19 declaration under Section 564(b)(1) of the Act, 21 U.S.C.section 360bbb-3(b)(1), unless the authorization is terminated  or revoked sooner.       Influenza A by PCR NEGATIVE NEGATIVE Final   Influenza B by PCR NEGATIVE NEGATIVE Final    Comment: (NOTE) The Xpert Xpress SARS-CoV-2/FLU/RSV plus assay is intended as an aid in the diagnosis of influenza from Nasopharyngeal swab specimens and  should not be used as a sole basis for treatment. Nasal washings and aspirates are unacceptable for Xpert Xpress SARS-CoV-2/FLU/RSV testing.  Fact Sheet for Patients: EntrepreneurPulse.com.au  Fact Sheet for Healthcare Providers: IncredibleEmployment.be  This test is not yet approved or cleared by the [place] FDA and has been authorized for detection and/or  diagnosis of SARS-CoV-2 by FDA under an Emergency Use Authorization (EUA). This EUA will remain in effect (meaning this test can be used) for the duration of the COVID-19 declaration under Section 564(b)(1) of the Act, 21 U.S.C. section 360bbb-3(b)(1), unless the authorization is terminated or revoked.  Performed at [facility], [place] [address] [place], [place] [address]      Terri Piedra, [place]  12/06/2020  11:25 AM

## 2020-12-06 NOTE — Evaluation (Signed)
Physical Therapy Evaluation Patient Details Name: Brandon [name] MRN: [id number] DOB: [birth year] Today's Date: 12/06/2020   History of Present Illness  65 y.o. male presents to [facility] on 11/29/2020 for heart cath. After heart cath pt developed chills, nausea, vomiting, dyspnea, found to have hypoxia and fever. Pt required intubation on 7/20, TEE 7/21, extubated 7/23, RUE DVT identified 7/24, pacemaker extracted 12/04/2020. 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  Clinical Impression  Pt presents to PT with deficits in activity tolerance, balance, gait, power, functional mobility, endurance. Pt demonstrates instability when ambulating without UE support, improved with use of assistive devices this session. Pt reports fatigue with limited mobility and will benefit from continued aggressive mobilization to aide in improving activity tolerance. PT recommends HHPT and a 4 wheeled walker with seat at the time of discharge.    Follow Up Recommendations Home health PT;Supervision - Intermittent    Equipment Recommendations  Other (comment) (4 wheeled walker with seat)    Recommendations for Other Services       Precautions / Restrictions Precautions Precautions: Fall Restrictions Weight Bearing Restrictions: No      Mobility  Bed Mobility                    Transfers Overall transfer level: Needs assistance Equipment used: 4-wheeled walker;None Transfers: Sit to/from Stand Sit to Stand: Supervision            Ambulation/Gait Ambulation/Gait assistance: Supervision Gait Distance (Feet): 100 Feet (additional trial of 25' with RW, 20' without device) Assistive device: Rolling walker (2 wheeled);4-wheeled walker;None Gait Pattern/deviations: Step-through pattern;Shuffle Gait velocity: functional Gait velocity interpretation:  1.31 - 2.62 ft/sec, indicative of limited community ambulator General Gait Details: pt with short shuffling steps with widened BOS and high guard when ambulating without UE support of RW or rollator. Balance and stride length improve with UE support of both RW and rollator  Stairs            Wheelchair Mobility    Modified Rankin (Stroke Patients Only)       Balance Overall balance assessment: Needs assistance Sitting-balance support: No upper extremity supported;Feet supported Sitting balance-Leahy Scale: Good     Standing balance support: No upper extremity supported Standing balance-Leahy Scale: Fair                               Pertinent Vitals/Pain Pain Assessment: Faces Faces Pain Scale: Hurts little more Pain Location: pacemaker extraction site Pain Descriptors / Indicators: Sore Pain Intervention(s): Monitored during session    Home Living Family/patient expects to be discharged to:: Private residence Living Arrangements: Alone Available Help at Discharge: Family;Neighbor;Available PRN/intermittently Type of Home: House Home Access: Stairs to enter Entrance Stairs-Rails: None Entrance Stairs-Number of Steps: 2 Home Layout: One level Home Equipment: None      Prior Function Level of Independence: Independent               Hand Dominance   Dominant Hand: Right    Extremity/Trunk Assessment   Upper Extremity Assessment Upper Extremity Assessment: Overall WFL for tasks assessed    Lower Extremity Assessment Lower Extremity Assessment: Overall WFL for tasks assessed    Cervical / Trunk Assessment Cervical / Trunk Assessment: Normal  Communication   Communication: No difficulties  Cognition Arousal/Alertness:  Awake/alert Behavior During Therapy: WFL for tasks assessed/performed Overall Cognitive Status: Within Functional Limits for tasks assessed                                        General Comments  General comments (skin integrity, edema, etc.): pt on 3L [place] upon arrival, weans to RA with sats from 88-91%, unrelaible waveform when mobilizing with use of walkers, desaturating into 70s and recovering rapidly without grip of RW. Pt left on 1.5 L [place] at rest, RN aware.    Exercises     Assessment/Plan    PT Assessment Patient needs continued PT services  PT Problem List Decreased activity tolerance;Decreased balance;Decreased mobility;Decreased knowledge of use of DME;Cardiopulmonary status limiting activity       PT Treatment Interventions DME instruction;Gait training;Stair training;Functional mobility training;Therapeutic activities;Therapeutic exercise;Balance training;Patient/family education    PT Goals (Current goals can be found in the Care Plan section)  Acute Rehab PT Goals Patient Stated Goal: to return to independence PT Goal Formulation: With patient/family Time For Goal Achievement: 12/20/20 Potential to Achieve Goals: Good Additional Goals Additional Goal #1: Pt will score >19/24 on DGI to indicate a reduced risk for falls Additional Goal #2: Pt will report 3/10 DOE or less when ambulating for at least 100' to indicate improvement in activity tolerance    Frequency Min 3X/week   Barriers to discharge        Co-evaluation               AM-PAC PT "6 Clicks" Mobility  Outcome Measure Help needed turning from your back to your side while in a flat bed without using bedrails?: A Little Help needed moving from lying on your back to sitting on the side of a flat bed without using bedrails?: A Little Help needed moving to and from a bed to a chair (including a wheelchair)?: A Little Help needed standing up from a chair using your arms (e.g., wheelchair or bedside chair)?: A Little Help needed to walk in hospital room?: A Little Help needed climbing 3-5 steps with a railing? : A Little 6 Click Score: 18    End of Session Equipment Utilized During Treatment:  Oxygen Activity Tolerance: Patient tolerated treatment well Patient left: in chair;with call bell/phone within reach;with family/visitor present Nurse Communication: Mobility status PT Visit Diagnosis: Unsteadiness on feet (R26.81);Other abnormalities of gait and mobility (R26.89)    Time: 6010-9323 PT Time Calculation (min) (ACUTE ONLY): 37 min   Charges:   PT Evaluation $PT Eval Low Complexity: 1 Low PT Treatments $Gait Training: 8-22 mins        Zenaida Niece, PT, DPT Acute Rehabilitation Pager: [phone]   Zenaida Niece 12/06/2020, 5:01 PM

## 2020-12-07 ENCOUNTER — Encounter (HOSPITAL_COMMUNITY): Payer: Self-pay | Admitting: Internal Medicine

## 2020-12-07 ENCOUNTER — Inpatient Hospital Stay: Payer: Self-pay

## 2020-12-07 DIAGNOSIS — J9602 Acute respiratory failure with hypercapnia: Secondary | ICD-10-CM | POA: Diagnosis not present

## 2020-12-07 DIAGNOSIS — I5022 Chronic systolic (congestive) heart failure: Secondary | ICD-10-CM | POA: Diagnosis not present

## 2020-12-07 DIAGNOSIS — J9601 Acute respiratory failure with hypoxia: Secondary | ICD-10-CM | POA: Diagnosis not present

## 2020-12-07 DIAGNOSIS — T827XXD Infection and inflammatory reaction due to other cardiac and vascular devices, implants and grafts, subsequent encounter: Secondary | ICD-10-CM | POA: Diagnosis not present

## 2020-12-07 LAB — GLUCOSE, CAPILLARY
Glucose-Capillary: 112 mg/dL — ABNORMAL HIGH (ref 70–99)
Glucose-Capillary: 120 mg/dL — ABNORMAL HIGH (ref 70–99)
Glucose-Capillary: 110 mg/dL — ABNORMAL HIGH (ref 70–99)
Glucose-Capillary: 105 mg/dL — ABNORMAL HIGH (ref 70–99)

## 2020-12-07 LAB — URINALYSIS, ROUTINE W REFLEX MICROSCOPIC
Bilirubin Urine: NEGATIVE
Glucose, UA: >=500 mg/dL — AB
Ketones, ur: NEGATIVE mg/dL
Leukocytes,Ua: NEGATIVE
Nitrite: NEGATIVE
Protein, ur: NEGATIVE mg/dL
Specific Gravity, Urine: 1.009 (ref 1.005–1.030)
pH: 5 (ref 5.0–8.0)

## 2020-12-07 LAB — CBC
HCT: 29.9 % — ABNORMAL LOW (ref 39.0–52.0)
Hemoglobin: 9.8 g/dL — ABNORMAL LOW (ref 13.0–17.0)
MCH: 28.6 pg (ref 26.0–34.0)
MCHC: 32.8 g/dL (ref 30.0–36.0)
MCV: 87.2 fL (ref 80.0–100.0)
Platelets: 194 10*3/uL (ref 150–400)
RBC: 3.43 MIL/uL — ABNORMAL LOW (ref 4.22–5.81)
RDW: 15 % (ref 11.5–15.5)
WBC: 10 10*3/uL (ref 4.0–10.5)
nRBC: 0 % (ref 0.0–0.2)

## 2020-12-07 LAB — BASIC METABOLIC PANEL
Anion gap: 8 (ref 5–15)
BUN: 17 mg/dL (ref 8–23)
CO2: 28 mmol/L (ref 22–32)
Calcium: 8.1 mg/dL — ABNORMAL LOW (ref 8.9–10.3)
Chloride: 96 mmol/L — ABNORMAL LOW (ref 98–111)
Creatinine, Ser: 0.84 mg/dL (ref 0.61–1.24)
GFR, Estimated: >60 mL/min (ref >60)
Glucose, Bld: 106 mg/dL — ABNORMAL HIGH (ref 70–99)
Potassium: 3.3 mmol/L — ABNORMAL LOW (ref 3.5–5.1)
Sodium: 132 mmol/L — ABNORMAL LOW (ref 135–145)

## 2020-12-07 LAB — URIC ACID: Uric Acid, Serum: 4 mg/dL (ref 3.7–8.6)

## 2020-12-07 MED ORDER — IVABRADINE HCL 5 MG PO TABS: 5.0000 mg | ORAL_TABLET | Freq: Two times a day (BID) | ORAL | Status: DC

## 2020-12-07 MED ORDER — ALUM & MAG HYDROXIDE-SIMETH 200-200-20 MG/5ML PO SUSP
30.0000 mL | ORAL | Status: DC | PRN
  Administered 2020-12-07 – 2020-12-08 (×2): 30 mL via ORAL
  Filled 2020-12-07: qty 30

## 2020-12-07 MED ORDER — SODIUM CHLORIDE 0.9% FLUSH
10.0000 mL | INTRAVENOUS | Status: DC | PRN
  Administered 2020-12-10: 10 mL

## 2020-12-07 MED ORDER — IVABRADINE HCL 5 MG PO TABS
5.0000 mg | ORAL_TABLET | Freq: Two times a day (BID) | ORAL | Status: DC
  Administered 2020-12-07 – 2020-12-08 (×2): 5 mg via ORAL
  Filled 2020-12-07 (×3): qty 1

## 2020-12-07 MED ORDER — GUAIFENESIN-DM 100-10 MG/5ML PO SYRP
15.0000 mL | ORAL_SOLUTION | ORAL | Status: DC | PRN
  Administered 2020-12-07 – 2020-12-11 (×5): 15 mL via ORAL
  Filled 2020-12-07 (×5): qty 15

## 2020-12-07 MED ORDER — SPIRONOLACTONE 12.5 MG HALF TABLET
12.5000 mg | ORAL_TABLET | Freq: Every day | ORAL | Status: DC
  Administered 2020-12-07 – 2020-12-08 (×2): 12.5 mg via ORAL
  Filled 2020-12-07 (×2): qty 1

## 2020-12-07 MED ORDER — SODIUM CHLORIDE 0.9% FLUSH
10.0000 mL | Freq: Two times a day (BID) | INTRAVENOUS | Status: DC
  Administered 2020-12-07 – 2020-12-08 (×3): 10 mL
  Administered 2020-12-09: 20 mL
  Administered 2020-12-09 – 2020-12-11 (×4): 10 mL

## 2020-12-07 MED ORDER — POTASSIUM CHLORIDE CRYS ER 20 MEQ PO TBCR
40.0000 meq | EXTENDED_RELEASE_TABLET | Freq: Once | ORAL | Status: AC
  Administered 2020-12-07: 40 meq via ORAL
  Filled 2020-12-07: qty 2

## 2020-12-07 MED ORDER — MIDODRINE HCL 5 MG PO TABS
2.5000 mg | ORAL_TABLET | Freq: Three times a day (TID) | ORAL | Status: DC
  Administered 2020-12-07 – 2020-12-08 (×3): 2.5 mg via ORAL
  Filled 2020-12-07 (×3): qty 1

## 2020-12-07 MED ORDER — MAGNESIUM HYDROXIDE 400 MG/5ML PO SUSP
30.0000 mL | Freq: Every day | ORAL | Status: DC | PRN
  Filled 2020-12-07: qty 30

## 2020-12-07 NOTE — TOC Benefit Eligibility Note (Signed)
Transition of Care [facility]) Benefit Eligibility Note    Patient Details  Name: Brandon [name] MRN: [id number] Date of Birth: 1955-11-29   Medication/Dose: Wilder Glade  10 MG DAILY  Covered?: Yes  Tier: 3 Drug  Prescription Coverage Preferred Pharmacy: [facility]  Spoke with Person/Company/Phone Number:: DAN @  [facility] RX #  [phone]  Co-Pay: $ 42.00  Prior Approval: No  Deductible: Met (OUT-OF-POCKET:UNMET)  Additional Notes: ELIQUIS  5 MG BID : COVER-YES, CO-PAY- $42.00 , TIER- 3 DRUG , P/A-NO    [name] Phone Number: 12/07/2020, 2:48 PM

## 2020-12-07 NOTE — Care Management Important Message (Signed)
Important Message  Patient Details  Name: Brandon [name] MRN: [id number] Date of Birth: 1955/11/04   Medicare Important Message Given:  Yes     Cyann Venti P Moca 12/07/2020, 11:00 AM

## 2020-12-07 NOTE — Progress Notes (Signed)
Peripherally Inserted Central Catheter Placement  The IV Nurse has discussed with the patient and/or persons authorized to consent for the patient, the purpose of this procedure and the potential benefits and risks involved with this procedure.  The benefits include less needle sticks, lab draws from the catheter, and the patient may be discharged home with the catheter. Risks include, but not limited to, infection, bleeding, blood clot (thrombus formation), and puncture of an artery; nerve damage and irregular heartbeat and possibility to perform a PICC exchange if needed/ordered by physician.  Alternatives to this procedure were also discussed.  Bard Power PICC patient education guide, fact sheet on infection prevention and patient information card has been provided to patient /or left at bedside.    PICC Placement Documentation  PICC Single Lumen 63/87/56 Left Basilic 47 cm 0 cm (Active)  Indication for Insertion or Continuance of Line Home intravenous therapies (PICC only) 12/07/20 1545  Exposed Catheter (cm) 0 cm 12/07/20 1545  Site Assessment Clean;Dry;Intact 12/07/20 1545  Line Status Flushed;Saline locked;Blood return noted 12/07/20 1545  Dressing Type Transparent;Securing device 12/07/20 1545  Dressing Status Clean;Dry;Intact 12/07/20 1545  Antimicrobial disc in place? Yes 12/07/20 1545  Safety Lock Not Applicable 43/32/95 1884  Line Care Connections checked and tightened 12/07/20 1545  Dressing Intervention New dressing 12/07/20 1545  Dressing Change Due 12/14/20 12/07/20 [place], [place] 12/07/2020, 4:02 PM

## 2020-12-07 NOTE — Progress Notes (Signed)
Pressure dressing removed, patient tolerated well Site looks very good, steri strips/sutures remain Wound check visit is in place for removal of steri strips and sutures. Follow up with Dr. Rayann Heman in place as well Life vest is bedside  EP will signs off though remain available Please recall if needed  Tommye Standard, PA-C

## 2020-12-07 NOTE — Progress Notes (Addendum)
Patient ID: Brandon [name], male   DOB: 13-Mar-1956, 65 y.o.   MRN: [id number]     Advanced Heart Failure Rounding Note  PCP-Cardiologist: [name], MD  [facility]: Dr. 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 Post procedure, developed severe rigors, n/v and fever to 102 then developed acute hypoxic respiratory failure, requiring intubation - 7/21 Blood Cx + for Strep. 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 Mean gradient 18. VR = 0.19 VTI = 7.5. RV mild HK - 7/22 CT C/A/P notable for aspiration PNA. No evidence septic emboli - Extubated 7/23 - Rt UE DVT 7/24 - ICD Extraction 7/25 - 7/26 Blood CX- ngtd  Subjective:   Yesterday farxiga restarted.   Feels ok. Denies SOB.   Objective:   Weight Range: 85.6 kg Body mass index is 25.59 kg/m.   Vital Signs:   Temp:  [97.8 F (36.6 C)-98.6 F (37 C)] 98.2 F (36.8 C) (07/28 0744) Pulse Rate:  [93-119] 119 (07/28 0744) Resp:  [15-20] 18 (07/28 0744) BP: (100-102)/(64-78) 102/68 (07/28 0744) SpO2:  [93 %-97 %] 97 % (07/28 0744) Weight:  [85.6 kg] 85.6 kg (07/28 0451) Last BM Date: 12/03/20  Weight change: Filed Weights   12/05/20 0500 12/06/20 0401 12/07/20 0451  Weight: 84 kg 86 kg 85.6 kg    Intake/Output:   Intake/Output Summary (Last 24 hours) at 12/07/2020 0753 Last data filed at 12/07/2020 0630 Gross per 24 hour  Intake 240 ml  Output 2550 ml  Net -2310 ml      Physical Exam   General:  Sitting in the chair. No resp difficulty HEENT: normal Neck: supple. JVP 6-7 . Carotids 2+ bilat; no bruits. No lymphadenopathy or thryomegaly  appreciated. Cor: PMI nondisplaced. Regular rate & rhythm. No rubs, gallops or murmurs. Left upper chest steri strips.  Lungs: decreased in the bases.  Abdomen: soft, nontender, nondistended. No hepatosplenomegaly. No bruits or masses. Good bowel sounds. Extremities: no cyanosis, clubbing, rash, edema Neuro: alert & orientedx3, cranial nerves grossly intact. moves all 4 extremities w/o difficulty. Affect pleasant  Telemetry   Sinus Tach 110-120s personally reviewed.    Labs    CBC Recent Labs    12/06/20 0056 12/07/20 0116  WBC 9.8 10.0  HGB 9.6* 9.8*  HCT 29.8* 29.9*  MCV 86.9 87.2  PLT 195 321   Basic Metabolic Panel Recent Labs    12/06/20 0056 12/07/20 0116  NA 135 132*  K 4.1 3.3*  CL 100 96*  CO2 30 28  GLUCOSE 99 106*  BUN 20 17  CREATININE 0.85 0.84  CALCIUM 8.1* 8.1*  MG 1.9  --    Liver Function Tests No results for input(s): AST, ALT, ALKPHOS, BILITOT, PROT, ALBUMIN in the last 72 hours.  No results for input(s): LIPASE, AMYLASE in the last 72 hours. Cardiac Enzymes No results for input(s): CKTOTAL, CKMB, CKMBINDEX, TROPONINI in the last 72 hours.  BNP: BNP (last 3 results) Recent Labs    11/01/20 1224 11/29/20 2240 11/30/20 0144  BNP 462.1* 1,586.1* 2,653.1*    ProBNP (last 3 results) Recent Labs  03/23/20 0922  PROBNP 520*     D-Dimer No results for input(s): DDIMER in the last 72 hours. Hemoglobin A1C No results for input(s): HGBA1C in the last 72 hours.  Fasting Lipid Panel No results for input(s): CHOL, HDL, LDLCALC, TRIG, CHOLHDL, LDLDIRECT in the last 72 hours. Thyroid Function Tests No results for input(s): TSH, T4TOTAL, T3FREE, THYROIDAB in the last 72 hours.  Invalid input(s): FREET3  Other results:   Imaging    No results found.   Medications:     Scheduled Medications:  sodium chloride   Intravenous Once   apixaban  5 mg Oral BID   atorvastatin  80 mg Oral Daily   Chlorhexidine Gluconate Cloth  6  each Topical Q0600   dapagliflozin propanediol  10 mg Oral Daily   digoxin  0.125 mg Oral Daily   docusate sodium  100 mg Oral BID   furosemide  40 mg Oral BID   gabapentin  100 mg Oral TID   insulin aspart  0-15 Units Subcutaneous TID WC   insulin aspart  0-5 Units Subcutaneous QHS   melatonin  5 mg Oral QHS   midodrine  5 mg Oral TID WC   mupirocin ointment  1 application Nasal BID   pantoprazole  40 mg Oral Daily   polyethylene glycol  17 g Oral Daily   sodium chloride flush  3 mL Intravenous Q12H   sodium chloride flush  3 mL Intravenous Q12H    Infusions:  sodium chloride     sodium chloride     sodium chloride Stopped (11/30/20 0631)   cefTRIAXone (ROCEPHIN)  IV 2 g (12/06/20 1716)    PRN Medications: sodium chloride, sodium chloride, acetaminophen (TYLENOL) oral liquid 160 mg/5 mL, acetaminophen, ondansetron (ZOFRAN) IV, oxyCODONE, sodium chloride flush, sodium chloride flush, traZODone    Patient Profile   65 year old with a history of h/o CAD s/p previous MI, HTN, HL, ST jude ICD, S/P Barostim, and chronic systolic heart failure, EF 20-25%.  Presented 7/20 for outpatient RHC as part of VAD w/u. Post procedure, developed severe rigors, n/v and fever to 102 several hours after cardiac catheterization while in short stay. IVFs given for hypotension, labs and blood cx drawn. Admitted to ICU and started on broad spectrum  abx. Later developed acute hypoxic respiratory failure, requiring intubation. Blood Cx + for Strep.   TEE w/ with no evidence of endocarditis. 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 Likely low-flow low gradient AS. Mean gradient 18. VR = 0.19 VTI = 7.5. LVEF 20%. RV mild HK  Assessment/Plan   Strep Bacteremia - Blood cx + for Strep gordonii - Repeat cx 7/21 NGTD  - TEE w/ with no clear evidence of endocarditis (fibroelastoma on Aov -> cannot completely exclude infection)  - ID has seen - now on ceftiraxone.   - ICD extracted 7/25. Per EP (ok to leave Baromstim as it  is extravascular) -  CVC removed 7/26 - 7/26 Repeat CX - No growth - Plan for 6 weeks IV antibiotics. - Place PICC today   - 2. Septic Shock  - in setting of bacteremia and aspiration PNA - c/w abx per ID - see above - Now off pressors and stable.  - Resolved.   3. Acute Hypoxic Respiratory Failure - Resolved/extubated. - O2 sats stable on 2 liters .   4. Chronic Systolic Heart Failure, ICM  - Echo 12/20 EF post anterior MI 30 to 35%. - Echo 6/21 EF 25-30% RV ok  - Echo  05/19/20 EF 20-25% RV mildly HK. moderate AS  Mean gradient 13 AVA 1.2 cm2 DI 0.30 - Initial RHC this admit with low filling pressures. CI marginal at 2.3 - hold  entresto for now with soft BP.   - No BB with soft BP. May need to consider ivabradine.  - c/w digoxin 0.125  - Volume status stable. Continue PO Lasix 40 bid. Supp K.  - Continue farxiga 10 mg daily - Add 12.5 mg eplerenone.  - C/w Midodrine 5 tid for BP support  - concerned he will need advanced therpaies. Likely not transplant candidate given age and lung disease. Being evaluated for LVAD, but currently not a candidate for implant given active infection/ bacteremia.  - S/P ICD extraction, 6 weeks IV abx.  - If surveillance cultures clear x 4 weeks can consider VAD implant.  - Home w/ LifeVest  5. CAD/NSTEMI  - s/p anterior STEMI 12/20. LHC showed chronically occluded RCA (with L>>R collaterals) and thrombotic occlusion of proximal LAD. Underwent PCI of LAD. - LHC this admit with stable CAD - Had NSTEMI in setting of septic shock - Continue ASA/statin.   6. Aortic Stenosis - AS is severe on TEE.  - Likely low-flow low gradient severe AS. Mean gradient 18. DI = 0.19 VTI = 7.5 - Presence of fibroelastoma prevents TAVR  7. RUE DVT  - on Eliquis     HH set up for home antibiotics. Will also need HHPT + 4 wheeled walker with seat.  Has LifeVest in the room for d/c   Length of Stay: 8  Amy Clegg, NP  12/07/2020, 7:53 AM  Advanced Heart  Failure Team Pager [phone] (M-F; 7a - 5p)  Please contact [facility] for night-coverage after hours (5p -7a ) and weekends on [email or web address]  Patient seen with NP, agree with the above note.   Stable today, feels ok.  Mildly tachy with HR around 110 (ST).    General: NAD Neck: No JVD, no thyromegaly or thyroid nodule.  Lungs: Clear to auscultation bilaterally with normal respiratory effort. CV: Nondisplaced PMI.  Heart regular S1/S2, no S3/S4, 2/6 SEM RUSB.  No peripheral edema.   Abdomen: Soft, nontender, no hepatosplenomegaly, no distention.  Skin: Intact without lesions or rashes.  Neurologic: Alert and oriented x 3.  Psych: Normal affect. Extremities: No clubbing or cyanosis.  HEENT: Normal.   Place PICC today for 6 wks IV abx (ceftriaxone).   Volume ok, continue Lasix 40 mg po bid.  Will continue Farxiga and add eplerenone 12.5 daily.  He is on digoxin.  Follow HR, may benefit from [place]lan[place].   Continuing on Eliquis for RUE DVT, right arm much less swollen.   Possibly home tomorrow after PICC.   Loralie Champagne 12/07/2020 9:32 AM

## 2020-12-07 NOTE — Plan of Care (Signed)
  Problem: Fluid Volume: Goal: Hemodynamic stability will improve Outcome: Progressing   Problem: Clinical Measurements: Goal: Diagnostic test results will improve Outcome: Progressing Goal: Signs and symptoms of infection will decrease Outcome: Progressing   Problem: Respiratory: Goal: Ability to maintain adequate ventilation will improve Outcome: Progressing   Problem: Education: Goal: Knowledge of General Education information will improve Description: Including pain rating scale, medication(s)/side effects and non-pharmacologic comfort measures Outcome: Progressing   Problem: Health Behavior/Discharge Planning: Goal: Ability to manage health-related needs will improve Outcome: Progressing   Problem: Activity: Goal: Risk for activity intolerance will decrease Outcome: Progressing   Problem: Coping: Goal: Level of anxiety will decrease Outcome: Progressing   Problem: Elimination: Goal: Will not experience complications related to bowel motility Outcome: Progressing Goal: Will not experience complications related to urinary retention Outcome: Progressing   Problem: Safety: Goal: Ability to remain free from injury will improve Outcome: Progressing   Problem: Skin Integrity: Goal: Risk for impaired skin integrity will decrease Outcome: Progressing

## 2020-12-07 NOTE — Progress Notes (Signed)
   12/07/20 1628  Vitals  Temp 99.1 F (37.3 C)  Temp Source Oral  BP 118/88  MAP (mmHg) 95  BP Location Left Leg  BP Method Automatic  Patient Position (if appropriate) Lying  Pulse Rate (!) 114  Pulse Rate Source Monitor  ECG Heart Rate (!) 114  Resp 19  Level of Consciousness  Level of Consciousness Alert  MEWS COLOR  MEWS Score Color Yellow  Oxygen Therapy  SpO2 90 %  O2 Device Room Air  Pain Assessment  Pain Scale 0-10  Pain Score 0  PCA/Epidural/Spinal Assessment  Respiratory Pattern Regular;Unlabored  ECG Monitoring  Cardiac Rhythm ST  Glasgow Coma Scale  Eye Opening 4  Best Verbal Response (NON-intubated) 5  Best Motor Response 6  Glasgow Coma Scale Score 15  MEWS Score  MEWS Temp 0  MEWS Systolic 0  MEWS Pulse 2  MEWS RR 0  MEWS LOC 0  MEWS Score 2  Provider Notification  Provider Name/Title Cardiology PA  Date Provider Notified 12/07/20  Time Provider Notified 1645  Notification Type Page  Notification Reason Change in status  Provider response En route  Date of Provider Response 12/07/20  Time of Provider Response 1700

## 2020-12-07 NOTE — Progress Notes (Signed)
Rn unable to collect co-ox on patient at this time. Patient awaiting PICC placement. Will continue to monitor.

## 2020-12-07 NOTE — Progress Notes (Signed)
EP Progress Note  Patient Name: Brandon [name] Date of Encounter: 12/07/2020  [facility] Cardiologist: [name], MD   Subjective   Feeling well. In chair. No complaints. Shoulder pain controlled.  Inpatient Medications    Scheduled Meds:  sodium chloride   Intravenous Once   apixaban  5 mg Oral BID   atorvastatin  80 mg Oral Daily   Chlorhexidine Gluconate Cloth  6 each Topical Q0600   dapagliflozin propanediol  10 mg Oral Daily   digoxin  0.125 mg Oral Daily   docusate sodium  100 mg Oral BID   furosemide  40 mg Oral BID   gabapentin  100 mg Oral TID   insulin aspart  0-15 Units Subcutaneous TID WC   insulin aspart  0-5 Units Subcutaneous QHS   melatonin  5 mg Oral QHS   midodrine  2.5 mg Oral TID WC   mupirocin ointment  1 application Nasal BID   pantoprazole  40 mg Oral Daily   polyethylene glycol  17 g Oral Daily   potassium chloride  40 mEq Oral Once   sodium chloride flush  3 mL Intravenous Q12H   sodium chloride flush  3 mL Intravenous Q12H   spironolactone  12.5 mg Oral Daily   Continuous Infusions:  sodium chloride     sodium chloride Stopped (11/30/20 0631)   cefTRIAXone (ROCEPHIN)  IV 2 g (12/06/20 1716)   PRN Meds: sodium chloride, acetaminophen (TYLENOL) oral liquid 160 mg/5 mL, acetaminophen, alum & mag hydroxide-simeth, guaiFENesin-dextromethorphan, magnesium hydroxide, ondansetron (ZOFRAN) IV, oxyCODONE, sodium chloride flush, sodium chloride flush, traZODone   Vital Signs    Vitals:   12/07/20 0940 12/07/20 1142 12/07/20 1232 12/07/20 1400  BP:  101/76 116/82 102/82  Pulse: (!) 108 (!) 121 (!) 120 (!) 109  Resp: 18 18 16 19   Temp:  98 F (36.7 C)  98.2 F (36.8 C)  TempSrc:  Oral  Oral  SpO2: 96% 95% 94% 94%  Weight:      Height:        Intake/Output Summary (Last 24 hours) at 12/07/2020 1520 Last data filed at 12/07/2020 0934 Gross per 24 hour  Intake 483 ml  Output 2060 ml  Net -1577 ml    Last 3 Weights 12/07/2020  12/06/2020 12/05/2020  Weight (lbs) 188 lb 11.4 oz 189 lb 9.5 oz 185 lb 3 oz  Weight (kg) 85.6 kg 86 kg 84 kg      Telemetry    SR/ST 80's-100's   - Personally Reviewed  ECG    No new EKGs - Personally Reviewed  Physical Exam   GEN: No acute distress.   Neck: No JVD Cardiac: RRR, no murmurs, rubs, or gallops.  Respiratory: CTA b/l. GI: Soft, nontender, non-distended  MS: trace LE edema L>R, RUE edema, No deformity. Neuro:  Nonfocal  Psych: Normal affect   L chest site: is dry, no hematoma, no ecchymosis.  Steri strips/sutites in place  [employer] Troponin:   Recent Labs  Lab 11/30/20 0144 11/30/20 0522  TROPONINIHS 3,550* 4,843*       Chemistry Recent Labs  Lab 12/05/20 0454 12/06/20 0056 12/07/20 0116  NA 135 135 132*  K 4.0 4.1 3.3*  CL 100 100 96*  CO2 27 30 28   GLUCOSE 115* 99 106*  BUN 23 20 17   CREATININE 0.77 0.85 0.84  CALCIUM 8.1* 8.1* 8.1*  GFRNONAA >60 >60 >60  ANIONGAP 8 5 8  Hematology Recent Labs  Lab 12/05/20 0454 12/06/20 0056 12/07/20 0116  WBC 10.5 9.8 10.0  RBC 3.11* 3.43* 3.43*  HGB 9.1* 9.6* 9.8*  HCT 27.1* 29.8* 29.9*  MCV 87.1 86.9 87.2  MCH 29.3 28.0 28.6  MCHC 33.6 32.2 32.8  RDW 15.0 15.0 15.0  PLT 175 195 194     BNP No results for input(s): BNP, PROBNP in the last 168 hours.    DDimer No results for input(s): DDIMER in the last 168 hours.   Radiology    [place] EKG SITE RITE  Result Date: 12/07/2020 If Site Rite image not attached, placement could not be confirmed due to current cardiac rhythm.   Cardiac Studies   No new  Patient Profile     65 y.o. male w/PMHx of CAD, ICM, chronic CHF (systolic), ICD, barostim, HTN, HLD admitted 7/20 for outpatient [facility] as part of VAD w/u. Post procedure, developed severe rigors, n/v and fever to 102 then developed acute hypoxic respiratory failure, requiring intubation and pressors(mixed cardiogenic/septic shock)  Blood cx + for Strep gordonii -  Repeat cx 7/21 NGTD  7/22 CT C/A/P notable for aspiration PNA. No evidence septic emboli Extubated 7/23 Off pressors  12/03/20: NEW acute DVT RUE  Device SJM single chamber ICD [place], [place] 7122 lead System implanted 4/120/2021  Assessment & Plan    Strep bacteremia and CIED infection S/p extraction 7/25 - sutures to be removed in 7-10 days  2. ICM, Chronic systolic HF - lifevest at discharge - follow up with Dr Rayann Heman to discuss reimplant strategy  3. RUE DVT - NOAC   For questions or updates, please contact [facility] Please consult [email or web address] for contact info under        Signed, Vickie Epley, MD  12/07/2020, 3:20 PM

## 2020-12-07 NOTE — Progress Notes (Signed)
Paged by nurse regarding tachycardia. Telemetry reveals sinus tachycardia. In and out bladder cath removed 650 cc of urine. Patient complains of bladder pain. UA shows significant glucose leakage but no significant nitrite or bacteremia. Potassium low this morning and renal function ok. Spoke with Dr. Aundra Dubin, will start on corlanor tonight.

## 2020-12-07 NOTE — TOC CM/SW Note (Signed)
TOC CM ordered RW and 3n1 bedside commode for home with [place]. Sent benefits check for [place] and Eliquis. Pt has [employer] with his Medicare Part D. [place], [place] ED TOC CM [phone]

## 2020-12-07 NOTE — Progress Notes (Signed)
   12/07/20 1232  Assess: MEWS Score  BP 116/82  Pulse Rate (!) 120  ECG Heart Rate (!) 123  Resp 16  Level of Consciousness Alert  SpO2 94 %  O2 Device Room Air  Patient Activity (if Appropriate) In bed  Assess: MEWS Score  MEWS Temp 0  MEWS Systolic 0  MEWS Pulse 2  MEWS RR 0  MEWS LOC 0  MEWS Score 2  MEWS Score Color Yellow  Assess: if the MEWS score is Yellow or Red  Were vital signs taken at a resting state? Yes  Focused Assessment No change from prior assessment  Early Detection of Sepsis Score *See Row Information* Low  MEWS guidelines implemented *See Row Information* Yes  Treat  MEWS Interventions Administered prn meds/treatments  Pain Scale 0-10  Pain Score 7  Pain Type Acute pain  Pain Location Arm  Pain Orientation Right  Pain Descriptors / Indicators Aching;Discomfort  Pain Frequency Intermittent  Pain Intervention(s) Medication (See eMAR)  Facial Expression 1  Body Movements 0  Muscle Tension 0  Compliance with ventilator (intubated pts.) N/A  Vocalization (extubated pts.) 1  CPOT Total 2  Complains of Gas  Interventions Medication (see MAR)  Gas relieved by Antacid (Ordered PRN per Cardiology Protocol)  Take Vital Signs  Increase Vital Sign Frequency  Yellow: Q 2hr X 2 then Q 4hr X 2, if remains yellow, continue Q 4hrs  Escalate  MEWS: Escalate Yellow: discuss with charge nurse/RN and consider discussing with provider and RRT  Notify: Charge Nurse/RN  Name of Charge Nurse/RN Notified April C. RN  Date Charge Nurse/RN Notified 12/07/20  Time Charge Nurse/RN Notified 1234

## 2020-12-08 ENCOUNTER — Other Ambulatory Visit (HOSPITAL_COMMUNITY): Payer: Self-pay

## 2020-12-08 DIAGNOSIS — R570 Cardiogenic shock: Secondary | ICD-10-CM | POA: Diagnosis not present

## 2020-12-08 LAB — TYPE AND SCREEN
ABO/RH(D): O NEG
Antibody Screen: NEGATIVE
Unit division: 0
Unit division: 0
Unit division: 0
Unit division: 0

## 2020-12-08 LAB — BPAM RBC
Blood Product Expiration Date: 202208272359
Blood Product Expiration Date: 202208272359
Blood Product Expiration Date: 202208272359
Blood Product Expiration Date: 202208272359
ISSUE DATE / TIME: 202207251500
ISSUE DATE / TIME: 202207251500
Unit Type and Rh: 5100
Unit Type and Rh: 5100
Unit Type and Rh: 5100
Unit Type and Rh: 5100

## 2020-12-08 LAB — COOXEMETRY PANEL
Carboxyhemoglobin: 1.7 % — ABNORMAL HIGH (ref 0.5–1.5)
Methemoglobin: 0.8 % (ref 0.0–1.5)
O2 Saturation: 56.9 %
Total hemoglobin: 8.7 g/dL — ABNORMAL LOW (ref 12.0–16.0)

## 2020-12-08 LAB — BASIC METABOLIC PANEL
Anion gap: 8 (ref 5–15)
BUN: 13 mg/dL (ref 8–23)
CO2: 31 mmol/L (ref 22–32)
Calcium: 8.2 mg/dL — ABNORMAL LOW (ref 8.9–10.3)
Chloride: 93 mmol/L — ABNORMAL LOW (ref 98–111)
Creatinine, Ser: 0.82 mg/dL (ref 0.61–1.24)
GFR, Estimated: >60 mL/min (ref >60)
Glucose, Bld: 107 mg/dL — ABNORMAL HIGH (ref 70–99)
Potassium: 4.1 mmol/L (ref 3.5–5.1)
Sodium: 132 mmol/L — ABNORMAL LOW (ref 135–145)

## 2020-12-08 LAB — GLUCOSE, CAPILLARY
Glucose-Capillary: 126 mg/dL — ABNORMAL HIGH (ref 70–99)
Glucose-Capillary: 120 mg/dL — ABNORMAL HIGH (ref 70–99)
Glucose-Capillary: 109 mg/dL — ABNORMAL HIGH (ref 70–99)
Glucose-Capillary: 121 mg/dL — ABNORMAL HIGH (ref 70–99)

## 2020-12-08 LAB — DIGOXIN LEVEL: Digoxin Level: <0.2 ng/mL — ABNORMAL LOW (ref 0.8–2.0)

## 2020-12-08 MED ORDER — EPLERENONE 25 MG PO TABS
12.5000 mg | ORAL_TABLET | Freq: Every day | ORAL | Status: DC
  Administered 2020-12-09 – 2020-12-10 (×2): 12.5 mg via ORAL
  Filled 2020-12-08 (×2): qty 1

## 2020-12-08 MED ORDER — BENZONATATE 100 MG PO CAPS
100.0000 mg | ORAL_CAPSULE | Freq: Two times a day (BID) | ORAL | Status: DC
  Administered 2020-12-08 – 2020-12-11 (×7): 100 mg via ORAL
  Filled 2020-12-08 (×8): qty 1

## 2020-12-08 MED ORDER — GLUCERNA SHAKE PO LIQD
237.0000 mL | Freq: Three times a day (TID) | ORAL | Status: DC
  Administered 2020-12-08 (×2): 237 mL via ORAL

## 2020-12-08 MED ORDER — TAMSULOSIN HCL 0.4 MG PO CAPS
0.4000 mg | ORAL_CAPSULE | Freq: Every day | ORAL | Status: DC
  Administered 2020-12-08 – 2020-12-11 (×4): 0.4 mg via ORAL
  Filled 2020-12-08 (×4): qty 1

## 2020-12-08 MED ORDER — IVABRADINE HCL 7.5 MG PO TABS
7.5000 mg | ORAL_TABLET | Freq: Two times a day (BID) | ORAL | Status: DC
  Administered 2020-12-08 – 2020-12-11 (×6): 7.5 mg via ORAL
  Filled 2020-12-08 (×7): qty 1

## 2020-12-08 MED ORDER — CHLORHEXIDINE GLUCONATE CLOTH 2 % EX PADS
6.0000 | MEDICATED_PAD | Freq: Every day | CUTANEOUS | Status: DC
  Administered 2020-12-08 – 2020-12-11 (×4): 6 via TOPICAL

## 2020-12-08 MED ORDER — FUROSEMIDE 40 MG PO TABS
40.0000 mg | ORAL_TABLET | Freq: Every day | ORAL | Status: DC
  Administered 2020-12-09 – 2020-12-11 (×3): 40 mg via ORAL
  Filled 2020-12-08 (×3): qty 1

## 2020-12-08 NOTE — Progress Notes (Addendum)
Physical Therapy Treatment Patient Details Name: Brandon [name] MRN: [id number] DOB: 1955/10/28 Today's Date: 12/08/2020    History of Present Illness 65 y.o. male presents to [facility] on 11/29/2020 for heart cath. After heart cath pt developed chills, nausea, vomiting, dyspnea, found to have hypoxia and fever. Pt required intubation on 7/20, TEE 7/21, extubated 7/23, RUE DVT identified 7/24, pacemaker extracted 12/04/2020. 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    PT Comments    Pt was eager to interact with therapy, but was consistently tangential with topics and foggy on the subject of hospital course and management and also how he feels he is progressing compared to his PLOF.  Emphasis on transition and transfer training/safety OOB and using the Rollator as AD.  Also working on progression of gait stability/stamina and safety.    Follow Up Recommendations  Home health PT;Supervision - Intermittent     Equipment Recommendations   [phone] received)    Recommendations for Other Services       Precautions / Restrictions Precautions Precautions: Fall    Mobility  Bed Mobility Overal bed mobility: Needs Assistance Bed Mobility: Supine to Sit     Supine to sit: Min guard          Transfers Overall transfer level: Needs assistance Equipment used: 4-wheeled walker Transfers: Sit to/from Stand Sit to Stand: Min guard         General transfer comment: cues for safety with rollator brakes, redirection to task.  no assist needed to come up.  Ambulation/Gait Ambulation/Gait assistance: Min guard;Supervision Gait Distance (Feet): 80 Feet (x2 with sitting rest in the rollator) Assistive device: 4-wheeled walker Gait Pattern/deviations: Step-through pattern   Gait velocity interpretation: 1.31 - 2.62 ft/sec, indicative of limited  community ambulator General Gait Details: short generally steady steps with drift in the hall way, still needing redirection, safety with rollator brakes, cues to not get rolled up/tangled int he lines.   Stairs             Wheelchair Mobility    Modified Rankin (Stroke Patients Only)       Balance Overall balance assessment: Needs assistance Sitting-balance support: No upper extremity supported;Feet supported Sitting balance-Leahy Scale: Good     Standing balance support: No upper extremity supported;Bilateral upper extremity supported Standing balance-Leahy Scale: Fair                              Cognition Arousal/Alertness: Awake/alert Behavior During Therapy: WFL for tasks assessed/performed Overall Cognitive Status: No family/caregiver present to determine baseline cognitive functioning                                 General Comments: pt tangential of thought, appears foggy on sequencing of events, how he compares to his typical normal.      Exercises      General Comments General comments (skin integrity, edema, etc.): SpO2 on 1-2 L [place] during gait 97%, HR in the 80's.  Sats 99% at rest.  Pt expressing not feeling as well as the numbers would suggest.      Pertinent Vitals/Pain Pain Assessment: Faces Faces Pain Scale: No hurt Pain Intervention(s): Monitored during session    Home Living  Prior Function            PT Goals (current goals can now be found in the care plan section) Acute Rehab PT Goals Patient Stated Goal: to return to independence PT Goal Formulation: With patient/family Time For Goal Achievement: 12/20/20 Potential to Achieve Goals: Good Progress towards PT goals: Progressing toward goals    Frequency    Min 3X/week      PT Plan Current plan remains appropriate;Other (comment) (TBA further as plan from cardiology is finalized)    Co-evaluation               AM-PAC PT "6 Clicks" Mobility   Outcome Measure  Help needed turning from your back to your side while in a flat bed without using bedrails?: A Little Help needed moving from lying on your back to sitting on the side of a flat bed without using bedrails?: A Little Help needed moving to and from a bed to a chair (including a wheelchair)?: A Little Help needed standing up from a chair using your arms (e.g., wheelchair or bedside chair)?: A Little Help needed to walk in hospital room?: A Little Help needed climbing 3-5 steps with a railing? : A Little 6 Click Score: 18    End of Session Equipment Utilized During Treatment: Oxygen Activity Tolerance: Patient limited by fatigue;Patient tolerated treatment well Patient left: in chair;with call bell/phone within reach;with family/visitor present Nurse Communication: Mobility status PT Visit Diagnosis: Unsteadiness on feet (R26.81);Other abnormalities of gait and mobility (R26.89)     Time: 2094-7096 PT Time Calculation (min) (ACUTE ONLY): 49 min  Charges:  $Gait Training: 8-22 mins $Therapeutic Activity: 23-37 mins                     12/08/2020  Ginger Carne., PT Acute Rehabilitation Services [phone]  (pager) [phone]  (office)   Tessie Fass Yuchen Fedor 12/08/2020, 4:38 PM

## 2020-12-08 NOTE — Plan of Care (Signed)

## 2020-12-08 NOTE — Progress Notes (Signed)
    12/08/20 0935  Resting  Supplemental oxygen during test? No  Resting Heart Rate 107  Resting Sp02 85    SATURATION QUALIFICATIONS: (This note is used to comply with regulatory documentation for home oxygen)  Patient Saturations on Room Air at Rest = 85%  Patient Saturations on Room Air while Ambulating = Unable to obtain, unable to ambulate.   Patient Saturations on 3 Liters of oxygen while at rest = 95%  Please briefly explain why patient needs home oxygen: Desaturates at rest. Unable to tolerate being without oxygen for prolonged periods of time without having oxygen saturations drop.

## 2020-12-08 NOTE — Progress Notes (Addendum)
Patient ID: Brandon [name], male   DOB: 01/16/1956, 65 y.o.   MRN: [id number]     Advanced Heart Failure Rounding Note  PCP-Cardiologist: [name], MD  [facility]: Dr. 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 Post procedure, developed severe rigors, n/v and fever to 102 then developed acute hypoxic respiratory failure, requiring intubation - 7/21 Blood Cx + for Strep. 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 Mean gradient 18. VR = 0.19 VTI = 7.5. RV mild HK - 7/22 CT C/A/P notable for aspiration PNA. No evidence septic emboli - Extubated 7/23 - Rt UE DVT 7/24 - ICD Extraction 7/25 - 7/26 Blood CX- ngtd  Subjective:   7/28 started on ivabradine for Sinus Tach. Last night foley placed for urinary retention.   Complaining of nausea.   Objective:   Weight Range: 84.4 kg Body mass index is 25.24 kg/m.   Vital Signs:   Temp:  [97.6 F (36.4 C)-99.1 F (37.3 C)] 98 F (36.7 C) (07/29 0748) Pulse Rate:  [93-124] 108 (07/29 0748) Resp:  [15-23] 23 (07/29 0748) BP: (101-137)/(74-91) 119/74 (07/29 0748) SpO2:  [90 %-97 %] 97 % (07/29 0748) Weight:  [84.4 kg] 84.4 kg (07/29 0500) Last BM Date: 12/03/20  Weight change: Filed Weights   12/06/20 0401 12/07/20 0451 12/08/20 0500  Weight: 86 kg 85.6 kg 84.4 kg    Intake/Output:   Intake/Output Summary (Last 24 hours) at 12/08/2020 0911 Last data filed at 12/08/2020 0553 Gross per 24 hour  Intake 3 ml  Output 2575 ml  Net -2572 ml      Physical Exam  CVP 4  General:   No resp difficulty HEENT: normal Neck: supple. no JVD. Carotids 2+ bilat; no  bruits. No lymphadenopathy or thryomegaly appreciated. Cor: PMI nondisplaced. Tachy Regular rate & rhythm. No rubs,  or murmurs. +S3  Lungs: clear Abdomen: soft, nontender, nondistended. No hepatosplenomegaly. No bruits or masses. Good bowel sounds. Extremities: no cyanosis, clubbing, rash, edema Neuro: alert & orientedx3, cranial nerves grossly intact. moves all 4 extremities w/o difficulty. Affect pleasant GU: Foley   Telemetry   Sinus Tach 90-110s     Labs    CBC Recent Labs    12/06/20 0056 12/07/20 0116  WBC 9.8 10.0  HGB 9.6* 9.8*  HCT 29.8* 29.9*  MCV 86.9 87.2  PLT 195 115   Basic Metabolic Panel Recent Labs    12/06/20 0056 12/07/20 0116 12/08/20 0250  NA 135 132* 132*  K 4.1 3.3* 4.1  CL 100 96* 93*  CO2 _0 GLUCOSE 99 106* 107*  BUN _1 CREATININE 0.85 0.84 0.82  CALCIUM 8.1* 8.1* 8.2*  MG 1.9  --   --    Liver Function Tests No results for input(s): AST, ALT, ALKPHOS, BILITOT, PROT, ALBUMIN in the last 72 hours.  No results for input(s): LIPASE, AMYLASE in the last 72 hours. Cardiac Enzymes No results for input(s): CKTOTAL, CKMB, CKMBINDEX, TROPONINI in the last 72 hours.  BNP: BNP (last 3 results) Recent Labs    11/01/20 1224 11/29/20 2240 11/30/20  0144  BNP 462.1* 1,586.1* 2,653.1*    ProBNP (last 3 results) Recent Labs    03/23/20 0922  PROBNP 520*     D-Dimer No results for input(s): DDIMER in the last 72 hours. Hemoglobin A1C No results for input(s): HGBA1C in the last 72 hours.  Fasting Lipid Panel No results for input(s): CHOL, HDL, LDLCALC, TRIG, CHOLHDL, LDLDIRECT in the last 72 hours. Thyroid Function Tests No results for input(s): TSH, T4TOTAL, T3FREE, THYROIDAB in the last 72 hours.  Invalid input(s): FREET3  Other results:   Imaging    No results found.   Medications:     Scheduled Medications:  sodium chloride   Intravenous Once   apixaban  5 mg Oral BID   atorvastatin  80 mg Oral Daily    benzonatate  100 mg Oral BID   Chlorhexidine Gluconate Cloth  6 each Topical Daily   dapagliflozin propanediol  10 mg Oral Daily   digoxin  0.125 mg Oral Daily   docusate sodium  100 mg Oral BID   furosemide  40 mg Oral BID   gabapentin  100 mg Oral TID   insulin aspart  0-15 Units Subcutaneous TID WC   insulin aspart  0-5 Units Subcutaneous QHS   ivabradine  5 mg Oral BID WC   melatonin  5 mg Oral QHS   midodrine  2.5 mg Oral TID WC   mupirocin ointment  1 application Nasal BID   pantoprazole  40 mg Oral Daily   polyethylene glycol  17 g Oral Daily   sodium chloride flush  10-40 mL Intracatheter Q12H   sodium chloride flush  3 mL Intravenous Q12H   sodium chloride flush  3 mL Intravenous Q12H   spironolactone  12.5 mg Oral Daily   tamsulosin  0.4 mg Oral Daily    Infusions:  sodium chloride     sodium chloride Stopped (11/30/20 0631)   cefTRIAXone (ROCEPHIN)  IV 2 g (12/07/20 1633)    PRN Medications: sodium chloride, acetaminophen (TYLENOL) oral liquid 160 mg/5 mL, acetaminophen, alum & mag hydroxide-simeth, guaiFENesin-dextromethorphan, magnesium hydroxide, ondansetron (ZOFRAN) IV, oxyCODONE, sodium chloride flush, sodium chloride flush, sodium chloride flush, traZODone    Patient Profile   65 year old with a history of h/o CAD s/p previous MI, HTN, HL, ST jude ICD, S/P Barostim, and chronic systolic heart failure, EF 20-25%.  Presented 7/20 for outpatient RHC as part of VAD w/u. Post procedure, developed severe rigors, n/v and fever to 102 several hours after cardiac catheterization while in short stay. IVFs given for hypotension, labs and blood cx drawn. Admitted to ICU and started on broad spectrum  abx. Later developed acute hypoxic respiratory failure, requiring intubation. Blood Cx + for Strep.   TEE w/ with no evidence of endocarditis. 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 Likely low-flow low gradient AS. Mean gradient 18. VR = 0.19 VTI = 7.5. LVEF 20%. RV mild  HK  Assessment/Plan   Strep Bacteremia - Blood cx + for Strep gordonii - Repeat cx 7/21 NGTD  - TEE w/ with no clear evidence of endocarditis (fibroelastoma on Aov -> cannot completely exclude infection)  - ID has seen - now on ceftiraxone.   - ICD extracted 7/25. Per EP (ok to leave Baromstim as it is extravascular) -  CVC removed 7/26 - 7/26 Repeat CX - No growth - Plan for 6 weeks IV antibiotics. - PICC placed.   - 2. Septic Shock  - in setting of bacteremia and aspiration PNA -  c/w abx per ID - see above - Now off pressors and stable.  - Resolved.   3. Acute Hypoxic Respiratory Failure - Resolved/extubated. - O2 sats stable on 2 liters [place].   4. Chronic Systolic Heart Failure, ICM  - Echo 12/20 EF post anterior MI 30 to 35%. - Echo 6/21 EF 25-30% RV ok  - Echo 05/19/20 EF 20-25% RV mildly HK. moderate AS  Mean gradient 13 AVA 1.2 cm2 DI 0.30 - Initial RHC this admit with low filling pressures. CI marginal at 2.3 - Now with PICC. Check CO-OX.  - No bb /entresto with soft BP.   - No BB with soft BP.   - c/w digoxin 0.125 , dig level < 0.2  - Volume status stable. CVP 4. Cut back lasix to 40 mg daily.   - Continue farxiga 10 mg daily - Continue 12.5 mg eplerenone daily .   - Stop midodrine.  - Increase ivabradine 7.5 mg twice a day.   - concerned he will need advanced therpaies. Likely not transplant candidate given age and lung disease. Being evaluated for LVAD, but currently not a candidate for implant given active infection/ bacteremia.  - S/P ICD extraction, 6 weeks IV abx.  - If surveillance cultures clear x 4 weeks can consider VAD implant.  - Home w/ LifeVest  5. CAD/NSTEMI  - s/p anterior STEMI 12/20. LHC showed chronically occluded RCA (with L>>R collaterals) and thrombotic occlusion of proximal LAD. Underwent PCI of LAD. - LHC this admit with stable CAD. No chest pain.  - Had NSTEMI in setting of septic shock - Continue ASA/statin.   6. Aortic Stenosis - AS  is severe on TEE.  - Likely low-flow low gradient severe AS. Mean gradient 18. DI = 0.19 VTI = 7.5 - Presence of fibroelastoma prevents TAVR  7. RUE DVT  - on Eliquis    8. Urinary Retention -Foley placed 7/28 . Start flomax.  - will need urology follow up.   Check CO-OX now.   Will need to ambulate and see if he need oxygen.   [facility] set up for home antibiotics. Will also need HHPT + 4 wheeled walker with seat.  Has LifeVest in the room for d/c   Length of Stay: [place], NP  12/08/2020, 9:11 AM  Advanced Heart Failure Team Pager [phone] (M-F; 7a - 5p)  Please contact [facility] for night-coverage after hours (5p -7a ) and weekends on [email or web address]  Patient seen with NP, agree with the above note.   CVP 4 today, co-ox 57%.  He is nauseated and does not feel great.  BP better. Still sinus tachy 100s-110s.   General: NAD Neck: No JVD, no thyromegaly or thyroid nodule.  Lungs: Clear to auscultation bilaterally with normal respiratory effort. CV: Nondisplaced PMI.  Heart mildly tachy regular S1/S2, no S3/S4, no murmur.  No peripheral edema.   Abdomen: Soft, nontender, no hepatosplenomegaly, no distention.  Skin: Intact without lesions or rashes.  Neurologic: Alert and oriented x 3.  Psych: Normal affect. Extremities: No clubbing or cyanosis.  HEENT: Normal.   I had considered sending him home today, but with nausea will keep for now.  Co-ox is 57%, will keep off milrinone for now.  Recheck in am, if lower may need to go home on milrinone.  Increase ivabradine to 7.5 mg bid and stop midodrine.  Decrease Lasix to 40 mg daily with CVP 4.    Ambulated, recheck co-ox in am.  Home tomorrow  possibly if co-ox is ok and feels better.   Loralie Champagne 12/08/2020 12:19 PM

## 2020-12-08 NOTE — TOC CM/SW Note (Signed)
HF TOC CM will be following for oxygen for home. Will need qualifying sat and order. [place], [place] ED TOC CM [phone]

## 2020-12-09 DIAGNOSIS — I5022 Chronic systolic (congestive) heart failure: Secondary | ICD-10-CM | POA: Diagnosis not present

## 2020-12-09 LAB — CBC
HCT: 27.9 % — ABNORMAL LOW (ref 39.0–52.0)
Hemoglobin: 8.8 g/dL — ABNORMAL LOW (ref 13.0–17.0)
MCH: 27.6 pg (ref 26.0–34.0)
MCHC: 31.5 g/dL (ref 30.0–36.0)
MCV: 87.5 fL (ref 80.0–100.0)
Platelets: 184 10*3/uL (ref 150–400)
RBC: 3.19 MIL/uL — ABNORMAL LOW (ref 4.22–5.81)
RDW: 14.8 % (ref 11.5–15.5)
WBC: 10 10*3/uL (ref 4.0–10.5)
nRBC: 0 % (ref 0.0–0.2)

## 2020-12-09 LAB — BASIC METABOLIC PANEL
Anion gap: 6 (ref 5–15)
BUN: 13 mg/dL (ref 8–23)
CO2: 30 mmol/L (ref 22–32)
Calcium: 8 mg/dL — ABNORMAL LOW (ref 8.9–10.3)
Chloride: 92 mmol/L — ABNORMAL LOW (ref 98–111)
Creatinine, Ser: 0.81 mg/dL (ref 0.61–1.24)
GFR, Estimated: >60 mL/min (ref >60)
Glucose, Bld: 109 mg/dL — ABNORMAL HIGH (ref 70–99)
Potassium: 4 mmol/L (ref 3.5–5.1)
Sodium: 128 mmol/L — ABNORMAL LOW (ref 135–145)

## 2020-12-09 LAB — COOXEMETRY PANEL
Carboxyhemoglobin: 1.5 % (ref 0.5–1.5)
Methemoglobin: 0.9 % (ref 0.0–1.5)
O2 Saturation: 59.9 %
Total hemoglobin: 9.2 g/dL — ABNORMAL LOW (ref 12.0–16.0)

## 2020-12-09 LAB — GLUCOSE, CAPILLARY
Glucose-Capillary: 100 mg/dL — ABNORMAL HIGH (ref 70–99)
Glucose-Capillary: 97 mg/dL (ref 70–99)
Glucose-Capillary: 121 mg/dL — ABNORMAL HIGH (ref 70–99)
Glucose-Capillary: 136 mg/dL — ABNORMAL HIGH (ref 70–99)

## 2020-12-09 NOTE — Progress Notes (Signed)
Patient ID: Brandon [name], male   DOB: 1956/04/03, 65 y.o.   MRN: [id number]     Advanced Heart Failure Rounding Note  PCP-Cardiologist: [name], MD  [facility]: Dr. 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 Post procedure, developed severe rigors, n/v and fever to 102 then developed acute hypoxic respiratory failure, requiring intubation - 7/21 Blood Cx + for Strep. 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 Mean gradient 18. VR = 0.19 VTI = 7.5. RV mild HK - 7/22 CT C/A/P notable for aspiration PNA. No evidence septic emboli - Extubated 7/23 - Rt UE DVT 7/24 - ICD Extraction 7/25 - 7/26 Blood CX- ngtd  Subjective:    No complaints this morning.  Somewhat tangential.   CVP 4.  Labs not done yet today.  HR in 80s (improved), BP also improved.   Objective:   Weight Range: 83.7 kg Body mass index is 25.03 kg/m.   Vital Signs:   Temp:  [97.8 F (36.6 C)-98.4 F (36.9 C)] 98 F (36.7 C) (07/30 0740) Pulse Rate:  [84-102] 85 (07/30 0740) Resp:  [16-24] 24 (07/30 0740) BP: (103-148)/(51-112) 111/71 (07/30 0740) SpO2:  [92 %-100 %] 96 % (07/30 0740) Weight:  [83.7 kg] 83.7 kg (07/30 0526) Last BM Date: 12/08/20  Weight change: Filed Weights   12/07/20 0451 12/08/20 0500 12/09/20 0526  Weight: 85.6 kg 84.4 kg 83.7 kg    Intake/Output:   Intake/Output Summary (Last 24 hours) at 12/09/2020 0942 Last data filed at 12/09/2020 0847 Gross per 24 hour  Intake 469 ml  Output 2725 ml  Net -2256 ml      Physical Exam  CVP 4  General: NAD Neck: No JVD, no thyromegaly or thyroid nodule.  Lungs:  Clear to auscultation bilaterally with normal respiratory effort. CV: Nondisplaced PMI.  Heart regular S1/S2, no S3/S4, 2/6 SEM RUSB.  1+ ankle edema.   Abdomen: Soft, nontender, no hepatosplenomegaly, no distention.  Skin: Intact without lesions or rashes.  Neurologic: Alert and oriented x 3.  Psych: Normal affect. Extremities: No clubbing or cyanosis.  HEENT: Normal.    Telemetry   NSR 80s (personally reviewed)   Labs    CBC Recent Labs    12/07/20 0116  WBC 10.0  HGB 9.8*  HCT 29.9*  MCV 87.2  PLT 858   Basic Metabolic Panel Recent Labs    12/07/20 0116 12/08/20 0250  NA 132* 132*  K 3.3* 4.1  CL 96* 93*  CO2 28 31  GLUCOSE 106* 107*  BUN 17 13  CREATININE 0.84 0.82  CALCIUM 8.1* 8.2*   Liver Function Tests No results for input(s): AST, ALT, ALKPHOS, BILITOT, PROT, ALBUMIN in the last 72 hours.  No results for input(s): LIPASE, AMYLASE in the last 72 hours. Cardiac Enzymes No results for input(s): CKTOTAL, CKMB, CKMBINDEX, TROPONINI in the last 72 hours.  BNP: BNP (last 3 results) Recent Labs    11/01/20 1224 11/29/20 2240 11/30/20 0144  BNP 462.1* 1,586.1* 2,653.1*    ProBNP (last 3 results) Recent Labs    03/23/20 0922  PROBNP 520*  D-Dimer No results for input(s): DDIMER in the last 72 hours. Hemoglobin A1C No results for input(s): HGBA1C in the last 72 hours.  Fasting Lipid Panel No results for input(s): CHOL, HDL, LDLCALC, TRIG, CHOLHDL, LDLDIRECT in the last 72 hours. Thyroid Function Tests No results for input(s): TSH, T4TOTAL, T3FREE, THYROIDAB in the last 72 hours.  Invalid input(s): FREET3  Other results:   Imaging    No results found.   Medications:     Scheduled Medications:  sodium chloride   Intravenous Once   apixaban  5 mg Oral BID   atorvastatin  80 mg Oral Daily   benzonatate  100 mg Oral BID   Chlorhexidine Gluconate Cloth  6 each Topical Daily   dapagliflozin propanediol  10 mg Oral Daily    digoxin  0.125 mg Oral Daily   docusate sodium  100 mg Oral BID   eplerenone  12.5 mg Oral Daily   feeding supplement (GLUCERNA SHAKE)  237 mL Oral TID BM   furosemide  40 mg Oral Daily   gabapentin  100 mg Oral TID   insulin aspart  0-15 Units Subcutaneous TID WC   insulin aspart  0-5 Units Subcutaneous QHS   ivabradine  7.5 mg Oral BID WC   melatonin  5 mg Oral QHS   pantoprazole  40 mg Oral Daily   polyethylene glycol  17 g Oral Daily   sodium chloride flush  10-40 mL Intracatheter Q12H   sodium chloride flush  3 mL Intravenous Q12H   sodium chloride flush  3 mL Intravenous Q12H   tamsulosin  0.4 mg Oral Daily    Infusions:  sodium chloride     sodium chloride Stopped (11/30/20 0631)   cefTRIAXone (ROCEPHIN)  IV 2 g (12/08/20 1412)    PRN Medications: sodium chloride, acetaminophen (TYLENOL) oral liquid 160 mg/5 mL, acetaminophen, alum & mag hydroxide-simeth, guaiFENesin-dextromethorphan, magnesium hydroxide, ondansetron (ZOFRAN) IV, oxyCODONE, sodium chloride flush, sodium chloride flush, sodium chloride flush, traZODone    Patient Profile   65 year old with a history of h/o CAD s/p previous MI, HTN, HL, ST jude ICD, S/P Barostim, and chronic systolic heart failure, EF 20-25%.  Presented 7/20 for outpatient RHC as part of VAD w/u. Post procedure, developed severe rigors, n/v and fever to 102 several hours after cardiac catheterization while in short stay. IVFs given for hypotension, labs and blood cx drawn. Admitted to ICU and started on broad spectrum  abx. Later developed acute hypoxic respiratory failure, requiring intubation. Blood Cx + for Strep.   TEE w/ with no evidence of endocarditis. 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 Likely low-flow low gradient AS. Mean gradient 18. VR = 0.19 VTI = 7.5. LVEF 20%. RV mild HK  Assessment/Plan   Strep Bacteremia - Blood cx + for Strep gordonii - Repeat cx 7/21 NGTD  - TEE w/ with no clear evidence of endocarditis (fibroelastoma on Aov ->  cannot completely exclude infection)  - ID has seen - now on ceftiraxone.   - ICD extracted 7/25. Per EP (ok to leave Baromstim as it is extravascular) -  CVC removed 7/26 - 7/26 Repeat CX - No growth - Plan for 6 weeks IV ceftriaxone. - PICC placed.   - 2. Septic Shock  - in setting of bacteremia and aspiration PNA - c/w abx per ID - see above - Now off pressors and stable.  - Resolved.   3. Acute Hypoxic Respiratory Failure - Resolved/extubated. - O2 sats stable on 2 liters [place].  4. Chronic Systolic Heart Failure, ICM  - Echo 12/20 EF post anterior MI 30 to 35%. - Echo 6/21 EF 25-30% RV ok  - Echo 05/19/20 EF 20-25% RV mildly HK. moderate AS  Mean gradient 13 AVA 1.2 cm2 DI 0.30 - Initial RHC this admit with low filling pressures. CI marginal at 2.3 - Now with PICC. Check CO-OX.  - No bb /entresto with soft BP.   - No BB with soft BP.   - Continue digoxin 0.125 , dig level < 0.2  - Volume status stable. CVP 4. Continue 40 mg daily.   - Continue farxiga 10 mg daily - Continue 12.5 mg eplerenone daily .   - Now off midodrine.  - Continue ivabradine 7.5 mg twice a day, HR better.  - Waiting for BMET and co-ox today.    - concerned he will need advanced therapies. Likely not transplant candidate given age and lung disease. Being evaluated for LVAD, but currently not a candidate for implant given active infection/ bacteremia.  - S/P ICD extraction, 6 weeks IV abx.  - Home w/ LifeVest  5. CAD/NSTEMI  - s/p anterior STEMI 12/20. LHC showed chronically occluded RCA (with L>>R collaterals) and thrombotic occlusion of proximal LAD. Underwent PCI of LAD. - LHC this admit with stable CAD. No chest pain.  - Had NSTEMI in setting of septic shock - Continue ASA/statin.   6. Aortic Stenosis - AS is severe on TEE.  - Likely low-flow low gradient severe AS. Mean gradient 18. DI = 0.19 VTI = 7.5 - Presence of fibroelastoma prevents TAVR  7. RUE DVT  - on Eliquis, swelling improved.      8. Urinary Retention - Foley placed 7/28 . Start flomax.  - will need urology follow up.   Needs co-ox and BMET today.    [place] set up for home antibiotics. Will also need HHPT + 4 wheeled walker with seat.  Has LifeVest in the room for d/c   If co-ox remains stable on current regimen, think we can let him go home tomorrow.   Length of Stay: [place], MD  12/09/2020, 9:42 AM  Advanced Heart Failure Team Pager [phone] (M-F; 7a - 5p)  Please contact [facility] for night-coverage after hours (5p -7a ) and weekends on [email or web address]

## 2020-12-09 NOTE — Plan of Care (Signed)
  Problem: Fluid Volume: Goal: Hemodynamic stability will improve Outcome: Progressing   Problem: Clinical Measurements: Goal: Diagnostic test results will improve Outcome: Progressing Goal: Signs and symptoms of infection will decrease Outcome: Progressing   Problem: Respiratory: Goal: Ability to maintain adequate ventilation will improve Outcome: Progressing   Problem: Clinical Measurements: Goal: Ability to maintain clinical measurements within normal limits will improve Outcome: Progressing Goal: Cardiovascular complication will be avoided Outcome: Progressing   Problem: Activity: Goal: Risk for activity intolerance will decrease Outcome: Progressing   Problem: Nutrition: Goal: Adequate nutrition will be maintained Outcome: Progressing

## 2020-12-09 NOTE — Progress Notes (Addendum)
D/C plan is for tomorrow. Notified Pam with [facility] that pt may be D/C tomorrow. PT is recommending HHPT. Met with pt. DME (rollator and 3-in-1 BSC) has been delivered. He reports that the Life Vest was also delivered. Discussed PT recommendations. He agreed to use [facility] for HHPT since they are providing the [facility] RN for home abx. Discussed home O2. Pt reports that he was not using home O2 prior to this admission. RN doesn't know if pt needs home O2. RN will f/u with home O2 if needed. Contacted Jason with [facility] for [facility] PT/RN referral. Will continue to f/u to assist with home O2 if needed.

## 2020-12-09 NOTE — Progress Notes (Signed)
Inpatient Rehab Admissions Coordinator:   Pt to d/c home with [facility]. CIR to sign off.   Clemens Catholic, [place], [place] Admissions Coordinator  [phone] ([place]) [phone] (office)

## 2020-12-10 DIAGNOSIS — I5022 Chronic systolic (congestive) heart failure: Secondary | ICD-10-CM | POA: Diagnosis not present

## 2020-12-10 LAB — CBC
HCT: 26.1 % — ABNORMAL LOW (ref 39.0–52.0)
Hemoglobin: 8.2 g/dL — ABNORMAL LOW (ref 13.0–17.0)
MCH: 27.5 pg (ref 26.0–34.0)
MCHC: 31.4 g/dL (ref 30.0–36.0)
MCV: 87.6 fL (ref 80.0–100.0)
Platelets: 190 10*3/uL (ref 150–400)
RBC: 2.98 MIL/uL — ABNORMAL LOW (ref 4.22–5.81)
RDW: 14.7 % (ref 11.5–15.5)
WBC: 7.8 10*3/uL (ref 4.0–10.5)
nRBC: 0 % (ref 0.0–0.2)

## 2020-12-10 LAB — BASIC METABOLIC PANEL
Anion gap: 6 (ref 5–15)
BUN: 13 mg/dL (ref 8–23)
CO2: 29 mmol/L (ref 22–32)
Calcium: 7.8 mg/dL — ABNORMAL LOW (ref 8.9–10.3)
Chloride: 96 mmol/L — ABNORMAL LOW (ref 98–111)
Creatinine, Ser: 0.8 mg/dL (ref 0.61–1.24)
GFR, Estimated: >60 mL/min (ref >60)
Glucose, Bld: 97 mg/dL (ref 70–99)
Potassium: 3.7 mmol/L (ref 3.5–5.1)
Sodium: 131 mmol/L — ABNORMAL LOW (ref 135–145)

## 2020-12-10 LAB — COOXEMETRY PANEL
Carboxyhemoglobin: 1.3 % (ref 0.5–1.5)
Methemoglobin: 1.1 % (ref 0.0–1.5)
O2 Saturation: 64.5 %
Total hemoglobin: 8.2 g/dL — ABNORMAL LOW (ref 12.0–16.0)

## 2020-12-10 LAB — CULTURE, BLOOD (ROUTINE X 2)
Culture: NO GROWTH
Culture: NO GROWTH
Special Requests: ADEQUATE
Special Requests: ADEQUATE

## 2020-12-10 LAB — GLUCOSE, CAPILLARY
Glucose-Capillary: 109 mg/dL — ABNORMAL HIGH (ref 70–99)
Glucose-Capillary: 91 mg/dL (ref 70–99)
Glucose-Capillary: 91 mg/dL (ref 70–99)
Glucose-Capillary: 134 mg/dL — ABNORMAL HIGH (ref 70–99)

## 2020-12-10 MED ORDER — EPLERENONE 25 MG PO TABS
25.0000 mg | ORAL_TABLET | Freq: Every day | ORAL | Status: DC
  Administered 2020-12-11: 25 mg via ORAL
  Filled 2020-12-10: qty 1

## 2020-12-10 NOTE — Progress Notes (Signed)
Patient ID: Brandon [name], male   DOB: [birth year], 65 y.o.   MRN: [id number]     Advanced Heart Failure Rounding Note  PCP-Cardiologist: [name], MD  [facility]: Dr. 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 Post procedure, developed severe rigors, n/v and fever to 102 then developed acute hypoxic respiratory failure, requiring intubation - 7/21 Blood Cx + for Strep. 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 Mean gradient 18. VR = 0.19 VTI = 7.5. RV mild HK - 7/22 CT C/A/P notable for aspiration PNA. No evidence septic emboli - Extubated 7/23 - Rt UE DVT 7/24 - ICD Extraction 7/25 - 7/26 Blood CX- ngtd  Subjective:    No complaints this morning.  Family is concerned about him going home as he lives alone.    CVP 8.  HR 80s, improved.  BP stable. Co-ox 65%.   Objective:   Weight Range: 83.8 kg Body mass index is 25.06 kg/m.   Vital Signs:   Temp:  [97.8 F (36.6 C)-98.2 F (36.8 C)] 97.8 F (36.6 C) (07/31 0317) Pulse Rate:  [64-92] 86 (07/31 0855) Resp:  [15-20] 15 (07/31 0317) BP: (100-130)/(67-75) 100/71 (07/31 0317) SpO2:  [95 %-99 %] 96 % (07/31 0317) Weight:  [83.8 kg] 83.8 kg (07/31 0317) Last BM Date: 12/08/20  Weight change: Filed Weights   12/08/20 0500 12/09/20 0526 12/10/20 0317  Weight: 84.4 kg 83.7 kg 83.8 kg    Intake/Output:   Intake/Output Summary (Last 24 hours) at 12/10/2020 0958 Last data filed at 12/10/2020 0300 Gross per 24 hour  Intake 3 ml  Output 2300 ml  Net -2297 ml      Physical Exam  CVP 8 General: NAD Neck: No JVD, no thyromegaly or thyroid nodule.   Lungs: Clear to auscultation bilaterally with normal respiratory effort. CV: Nondisplaced PMI.  Heart regular S1/S2, no S3/S4, 2/6 SEM RUSB.  1+ ankle edema.  Abdomen: Soft, nontender, no hepatosplenomegaly, no distention.  Skin: Intact without lesions or rashes.  Neurologic: Alert and oriented x 3.  Psych: Normal affect. Extremities: No clubbing or cyanosis.  HEENT: Normal.    Telemetry   NSR 80s (personally reviewed)   Labs    CBC Recent Labs    12/09/20 0935 12/10/20 0510  WBC 10.0 7.8  HGB 8.8* 8.2*  HCT 27.9* 26.1*  MCV 87.5 87.6  PLT 184 564   Basic Metabolic Panel Recent Labs    12/09/20 0935 12/10/20 0510  NA 128* 131*  K 4.0 3.7  CL 92* 96*  CO2 30 29  GLUCOSE 109* 97  BUN 13 13  CREATININE 0.81 0.80  CALCIUM 8.0* 7.8*   Liver Function Tests No results for input(s): AST, ALT, ALKPHOS, BILITOT, PROT, ALBUMIN in the last 72 hours.  No results for input(s): LIPASE, AMYLASE in the last 72 hours. Cardiac Enzymes No results for input(s): CKTOTAL, CKMB, CKMBINDEX, TROPONINI in the last 72 hours.  BNP: BNP (last 3 results) Recent Labs    11/01/20 1224 11/29/20 2240 11/30/20 0144  BNP 462.1* 1,586.1* 2,653.1*    ProBNP (last 3 results) Recent  Labs    03/23/20 0922  PROBNP 520*     D-Dimer No results for input(s): DDIMER in the last 72 hours. Hemoglobin A1C No results for input(s): HGBA1C in the last 72 hours.  Fasting Lipid Panel No results for input(s): CHOL, HDL, LDLCALC, TRIG, CHOLHDL, LDLDIRECT in the last 72 hours. Thyroid Function Tests No results for input(s): TSH, T4TOTAL, T3FREE, THYROIDAB in the last 72 hours.  Invalid input(s): FREET3  Other results:   Imaging    No results found.   Medications:     Scheduled Medications:  sodium chloride   Intravenous Once   apixaban  5 mg Oral BID   atorvastatin  80 mg Oral Daily   benzonatate  100 mg Oral BID   Chlorhexidine Gluconate Cloth  6 each Topical Daily    dapagliflozin propanediol  10 mg Oral Daily   digoxin  0.125 mg Oral Daily   docusate sodium  100 mg Oral BID   [START ON 12/11/2020] eplerenone  25 mg Oral Daily   feeding supplement (GLUCERNA SHAKE)  237 mL Oral TID BM   furosemide  40 mg Oral Daily   gabapentin  100 mg Oral TID   insulin aspart  0-15 Units Subcutaneous TID WC   insulin aspart  0-5 Units Subcutaneous QHS   ivabradine  7.5 mg Oral BID WC   melatonin  5 mg Oral QHS   pantoprazole  40 mg Oral Daily   polyethylene glycol  17 g Oral Daily   sodium chloride flush  10-40 mL Intracatheter Q12H   sodium chloride flush  3 mL Intravenous Q12H   sodium chloride flush  3 mL Intravenous Q12H   tamsulosin  0.4 mg Oral Daily    Infusions:  sodium chloride     sodium chloride Stopped (11/30/20 0631)   cefTRIAXone (ROCEPHIN)  IV 2 g (12/09/20 1205)    PRN Medications: sodium chloride, acetaminophen (TYLENOL) oral liquid 160 mg/5 mL, acetaminophen, alum & mag hydroxide-simeth, guaiFENesin-dextromethorphan, magnesium hydroxide, ondansetron (ZOFRAN) IV, oxyCODONE, sodium chloride flush, sodium chloride flush, sodium chloride flush, traZODone    Patient Profile   65 year old with a history of h/o CAD s/p previous MI, HTN, HL, ST jude ICD, S/P Barostim, and chronic systolic heart failure, EF 20-25%.  Presented 7/20 for outpatient RHC as part of VAD w/u. Post procedure, developed severe rigors, n/v and fever to 102 several hours after cardiac catheterization while in short stay. IVFs given for hypotension, labs and blood cx drawn. Admitted to ICU and started on broad spectrum  abx. Later developed acute hypoxic respiratory failure, requiring intubation. Blood Cx + for Strep.   TEE w/ with no evidence of endocarditis. 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 Likely low-flow low gradient AS. Mean gradient 18. VR = 0.19 VTI = 7.5. LVEF 20%. RV mild HK  Assessment/Plan   Strep Bacteremia - Blood cx + for Strep gordonii - Repeat cx 7/21 NGTD  - TEE w/  with no clear evidence of endocarditis (fibroelastoma on Aov -> cannot completely exclude infection) - ID has seen - now on ceftiraxone.  - ICD extracted 7/25. Per EP (ok to leave Baromstim as it is extravascular) - CVC removed 7/26 - 7/26 Repeat CX - No growth - Plan for 6 weeks IV ceftriaxone. - PICC placed.   - 2. Septic Shock  - in setting of bacteremia and aspiration PNA - c/w abx per ID - see above - Now off pressors and stable.  - Resolved.   3. Acute  Hypoxic Respiratory Failure - Resolved/extubated. - O2 sats stable on 2 liters [place] => will try to wean off oxygen today.   4. Chronic Systolic Heart Failure, ICM  - Echo 12/20 EF post anterior MI 30 to 35%. - Echo 6/21 EF 25-30% RV ok  - Echo 05/19/20 EF 20-25% RV mildly HK. moderate AS  Mean gradient 13 AVA 1.2 cm2 DI 0.30 - Initial RHC this admit with low filling pressures. CI marginal at 2.3 - Co-ox 65% today off inotropes.  - No bb /entresto with soft BP.   - No BB with soft BP.   - Continue digoxin 0.125 , dig level < 0.2  - Volume status stable. CVP 8. Continue 40 mg daily.   - Continue farxiga 10 mg daily - Increase to 25 mg eplerenone daily.   - Now off midodrine.  - Continue ivabradine 7.5 mg twice a day, HR better.  - concerned he will need advanced therapies. Likely not transplant candidate given age and lung disease. Being evaluated for LVAD, but currently not a candidate for implant given active infection/ bacteremia.  - S/P ICD extraction, 6 weeks IV abx.  - Home w/ LifeVest  5. CAD/NSTEMI  - s/p anterior STEMI 12/20. LHC showed chronically occluded RCA (with L>>R collaterals) and thrombotic occlusion of proximal LAD. Underwent PCI of LAD. - LHC this admit with stable CAD. No chest pain.  - Had NSTEMI in setting of septic shock - Continue ASA/statin.   6. Aortic Stenosis - AS is severe on TEE.  - Likely low-flow low gradient severe AS. Mean gradient 18. DI = 0.19 VTI = 7.5 - Presence of fibroelastoma  prevents TAVR  7. RUE DVT  - on Eliquis, swelling improved.     8. Urinary Retention - Foley placed 7/28 . Started flomax.  - Trial of foley discontinuation today.  - will need urology follow up.   [place] set up for home antibiotics. Will also need HHPT + 4 wheeled walker with seat.  Has LifeVest in the room for d/c   He is ready to leave the hospital.  Family is concerned about him being at home alone.  This is a reasonable concern, apparently there is no one who can stay with him.  I will have PT assess him again today to see if he could qualify for a rehab stay.   Length of Stay: 43  Loralie Champagne, MD  12/10/2020, 9:58 AM  Advanced Heart Failure Team Pager [phone] (M-F; 7a - 5p)  Please contact [facility] for night-coverage after hours (5p -7a ) and weekends on [email or web address]

## 2020-12-10 NOTE — Progress Notes (Signed)
Physical Therapy Treatment Patient Details Name: Brandon [name] MRN: [id number] DOB: 1955/09/27 Today's Date: 12/10/2020    History of Present Illness 65 y.o. male presents to [facility] 11/29/2020 for heart cath. After heart cath pt developed chills, nausea, vomiting, dyspnea, found to have hypoxia and fever. Pt required intubation on 7/20, TEE 7/21, extubated 7/23, RUE DVT identified 7/24, pacemaker extracted 12/04/2020. 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    PT Comments    Pt making excellent progress towards his physical therapy goals, exhibiting improved activity tolerance and ambulation distance. Ambulating 100 ft x 2 with a Rollator at a modI level. Able to self cue when to take a seated rest break. SpO2 90-94% on RA, HR 80-92 bpm. Overall, continues with decreased cardiopulmonary endurance, however this seems to be improving daily. Discussed assessment with pt and pt brother via phone. D/c plan of HHPT remains appropriate. Recommend supervision/assist for IADL's such as cooking, which pt states he has through various family members, neighbors, coworkers.    Follow Up Recommendations  Home health PT;Supervision - Intermittent     Equipment Recommendations   [phone] received)    Recommendations for Other Services       Precautions / Restrictions Precautions Precautions: Fall Restrictions Weight Bearing Restrictions: No    Mobility  Bed Mobility               General bed mobility comments: OOB in chair    Transfers Overall transfer level: Modified independent Equipment used: 4-wheeled walker             General transfer comment: cues for locking Rollator prior to standing or sitting  Ambulation/Gait Ambulation/Gait assistance: Modified independent (Device/Increase time) Gait Distance (Feet): 200 Feet (100 ft x  2) Assistive device: 4-wheeled walker Gait Pattern/deviations: Step-through pattern     General Gait Details: Pt able to self cue for activity pacing, slow and steady pace, no gross imbalance noted. Slightly increased cervical/trunk flexion   Stairs             Wheelchair Mobility    Modified Rankin (Stroke Patients Only)       Balance Overall balance assessment: Needs assistance Sitting-balance support: No upper extremity supported;Feet supported Sitting balance-Leahy Scale: Good     Standing balance support: No upper extremity supported;Bilateral upper extremity supported Standing balance-Leahy Scale: Fair                              Cognition Arousal/Alertness: Awake/alert Behavior During Therapy: WFL for tasks assessed/performed Overall Cognitive Status: Impaired/Different from baseline Area of Impairment: Memory                     Memory: Decreased short-term memory                Exercises      General Comments        Pertinent Vitals/Pain Pain Assessment: Faces Faces Pain Scale: No hurt    Home Living                      Prior Function            PT Goals (current goals can now be found in the care plan section) Acute Rehab PT Goals Patient Stated Goal: to return  to independence PT Goal Formulation: With patient/family Time For Goal Achievement: 12/20/20 Potential to Achieve Goals: Good Progress towards PT goals: Progressing toward goals    Frequency    Min 3X/week      PT Plan Current plan remains appropriate    Co-evaluation              AM-PAC PT "6 Clicks" Mobility   Outcome Measure  Help needed turning from your back to your side while in a flat bed without using bedrails?: None Help needed moving from lying on your back to sitting on the side of a flat bed without using bedrails?: None Help needed moving to and from a bed to a chair (including a wheelchair)?: None Help needed  standing up from a chair using your arms (e.g., wheelchair or bedside chair)?: None Help needed to walk in hospital room?: None Help needed climbing 3-5 steps with a railing? : A Little 6 Click Score: 23    End of Session   Activity Tolerance: Patient tolerated treatment well Patient left: in chair;with call bell/phone within reach Nurse Communication: Mobility status PT Visit Diagnosis: Unsteadiness on feet (R26.81);Other abnormalities of gait and mobility (R26.89)     Time: 1100-1140 PT Time Calculation (min) (ACUTE ONLY): 40 min  Charges:  $Therapeutic Activity: 38-52 mins                     Wyona Almas, PT, DPT Acute Rehabilitation Services Pager [phone] Office [phone]    Deno Etienne 12/10/2020, 1:42 PM

## 2020-12-11 ENCOUNTER — Other Ambulatory Visit (HOSPITAL_COMMUNITY): Payer: Self-pay

## 2020-12-11 DIAGNOSIS — J9602 Acute respiratory failure with hypercapnia: Secondary | ICD-10-CM | POA: Diagnosis not present

## 2020-12-11 DIAGNOSIS — J9601 Acute respiratory failure with hypoxia: Secondary | ICD-10-CM | POA: Diagnosis not present

## 2020-12-11 LAB — CBC
HCT: 28.3 % — ABNORMAL LOW (ref 39.0–52.0)
Hemoglobin: 9.2 g/dL — ABNORMAL LOW (ref 13.0–17.0)
MCH: 28.2 pg (ref 26.0–34.0)
MCHC: 32.5 g/dL (ref 30.0–36.0)
MCV: 86.8 fL (ref 80.0–100.0)
Platelets: 186 10*3/uL (ref 150–400)
RBC: 3.26 MIL/uL — ABNORMAL LOW (ref 4.22–5.81)
RDW: 14.7 % (ref 11.5–15.5)
WBC: 8.4 10*3/uL (ref 4.0–10.5)
nRBC: 0 % (ref 0.0–0.2)

## 2020-12-11 LAB — COOXEMETRY PANEL
Carboxyhemoglobin: 1.5 % (ref 0.5–1.5)
Methemoglobin: 0.8 % (ref 0.0–1.5)
O2 Saturation: 53.8 %
Total hemoglobin: 9.9 g/dL — ABNORMAL LOW (ref 12.0–16.0)

## 2020-12-11 LAB — BASIC METABOLIC PANEL
Anion gap: 6 (ref 5–15)
BUN: 10 mg/dL (ref 8–23)
CO2: 28 mmol/L (ref 22–32)
Calcium: 7.9 mg/dL — ABNORMAL LOW (ref 8.9–10.3)
Chloride: 98 mmol/L (ref 98–111)
Creatinine, Ser: 0.83 mg/dL (ref 0.61–1.24)
GFR, Estimated: >60 mL/min (ref >60)
Glucose, Bld: 118 mg/dL — ABNORMAL HIGH (ref 70–99)
Potassium: 3.4 mmol/L — ABNORMAL LOW (ref 3.5–5.1)
Sodium: 132 mmol/L — ABNORMAL LOW (ref 135–145)

## 2020-12-11 LAB — GLUCOSE, CAPILLARY
Glucose-Capillary: 118 mg/dL — ABNORMAL HIGH (ref 70–99)
Glucose-Capillary: 106 mg/dL — ABNORMAL HIGH (ref 70–99)

## 2020-12-11 MED ORDER — CEFTRIAXONE IV (FOR PTA / DISCHARGE USE ONLY): 2.0000 g | INTRAVENOUS | 0 refills | Status: DC

## 2020-12-11 MED ORDER — IVABRADINE HCL 7.5 MG PO TABS
7.5000 mg | ORAL_TABLET | Freq: Two times a day (BID) | ORAL | 6 refills | Status: DC
  Filled 2020-12-11: qty 60, 30d supply, fill #0

## 2020-12-11 MED ORDER — GABAPENTIN 100 MG PO CAPS
100.0000 mg | ORAL_CAPSULE | Freq: Three times a day (TID) | ORAL | 0 refills | Status: DC
  Filled 2020-12-11: qty 90, 30d supply, fill #0

## 2020-12-11 MED ORDER — DIGOXIN 125 MCG PO TABS
0.1250 mg | ORAL_TABLET | Freq: Every day | ORAL | 6 refills | Status: DC
  Filled 2020-12-11: qty 30, 30d supply, fill #0

## 2020-12-11 MED ORDER — FUROSEMIDE 40 MG PO TABS: 40.0000 mg | ORAL_TABLET | ORAL | 3 refills | Status: DC

## 2020-12-11 MED ORDER — FUROSEMIDE 40 MG PO TABS: 40.0000 mg | ORAL_TABLET | ORAL | Status: DC

## 2020-12-11 MED ORDER — PANTOPRAZOLE SODIUM 40 MG PO TBEC
40.0000 mg | DELAYED_RELEASE_TABLET | Freq: Every day | ORAL | 6 refills | Status: DC
  Filled 2020-12-11: qty 30, 30d supply, fill #0

## 2020-12-11 MED ORDER — DAPAGLIFLOZIN PROPANEDIOL 10 MG PO TABS: 10.0000 mg | ORAL_TABLET | Freq: Every morning | ORAL | 6 refills | Status: DC

## 2020-12-11 MED ORDER — MELATONIN 5 MG PO TABS
5.0000 mg | ORAL_TABLET | Freq: Every day | ORAL | 6 refills | Status: DC
  Filled 2020-12-11: qty 30, 30d supply, fill #0

## 2020-12-11 MED ORDER — APIXABAN 5 MG PO TABS
5.0000 mg | ORAL_TABLET | Freq: Two times a day (BID) | ORAL | 6 refills | Status: DC
  Filled 2020-12-11: qty 60, 30d supply, fill #0

## 2020-12-11 MED ORDER — POTASSIUM CHLORIDE CRYS ER 20 MEQ PO TBCR
40.0000 meq | EXTENDED_RELEASE_TABLET | Freq: Once | ORAL | Status: AC
  Administered 2020-12-11: 40 meq via ORAL
  Filled 2020-12-11: qty 2

## 2020-12-11 MED ORDER — EPLERENONE 50 MG PO TABS: 25.0000 mg | ORAL_TABLET | Freq: Every morning | ORAL | 6 refills | Status: DC

## 2020-12-11 NOTE — Progress Notes (Signed)
SATURATION QUALIFICATIONS: (This note is used to comply with regulatory documentation for home oxygen)  Patient Saturations on Room Air at Rest = 92%  Patient Saturations on Room Air while Ambulating = 88%  Patient Saturations on 2 Liters of oxygen while Ambulating = 93%  Please briefly explain why patient needs home oxygen: Requires 2L of O2 with activity.

## 2020-12-11 NOTE — Plan of Care (Signed)
  Problem: Fluid Volume: Goal: Hemodynamic stability will improve Outcome: Adequate for Discharge   Problem: Clinical Measurements: Goal: Diagnostic test results will improve Outcome: Adequate for Discharge Goal: Signs and symptoms of infection will decrease Outcome: Adequate for Discharge   Problem: Clinical Measurements: Goal: Signs and symptoms of infection will decrease Outcome: Adequate for Discharge   Problem: Respiratory: Goal: Ability to maintain adequate ventilation will improve Outcome: Adequate for Discharge   Problem: Education: Goal: Knowledge of General Education information will improve Description: Including pain rating scale, medication(s)/side effects and non-pharmacologic comfort measures Outcome: Adequate for Discharge   Problem: Health Behavior/Discharge Planning: Goal: Ability to manage health-related needs will improve Outcome: Adequate for Discharge   Problem: Clinical Measurements: Goal: Ability to maintain clinical measurements within normal limits will improve Outcome: Adequate for Discharge Goal: Will remain free from infection Outcome: Adequate for Discharge Goal: Diagnostic test results will improve Outcome: Adequate for Discharge Goal: Respiratory complications will improve Outcome: Adequate for Discharge Goal: Cardiovascular complication will be avoided Outcome: Adequate for Discharge

## 2020-12-11 NOTE — Progress Notes (Addendum)
Patient ID: Brandon [name], male   DOB: [birth year], 65 y.o.   MRN: [id number]     Advanced Heart Failure Rounding Note  PCP-Cardiologist: [name], MD  [facility]: Dr. 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 Post procedure, developed severe rigors, n/v and fever to 102 then developed acute hypoxic respiratory failure, requiring intubation - 7/21 Blood Cx + for Strep. 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 Mean gradient 18. VR = 0.19 VTI = 7.5. RV mild HK - 7/22 CT C/A/P notable for aspiration PNA. No evidence septic emboli - Extubated 7/23 - Rt UE DVT 7/24 - ICD Extraction 7/25 - 7/26 Blood CX- ngtd  Subjective:   CO-OX 54%.   Wants to go home. Wants to go home with oxygen. Denies SOB.   Objective:   Weight Range: 83.5 kg Body mass index is 24.97 kg/m.   Vital Signs:   Temp:  [97.7 F (36.5 C)-98.1 F (36.7 C)] 97.9 F (36.6 C) (08/01 0754) Pulse Rate:  [71-90] 86 (08/01 0754) Resp:  [16-23] 20 (08/01 0754) BP: (112-159)/(68-101) 159/101 (08/01 0754) SpO2:  [93 %-98 %] 94 % (08/01 0754) Weight:  [83.5 kg] 83.5 kg (08/01 0312) Last BM Date: 12/08/20  Weight change: Filed Weights   12/09/20 0526 12/10/20 0317 12/11/20 0312  Weight: 83.7 kg 83.8 kg 83.5 kg    Intake/Output:   Intake/Output Summary (Last 24 hours) at 12/11/2020 0940 Last data filed at 12/11/2020 0900 Gross per 24 hour  Intake 10 ml  Output 2015 ml  Net -2005 ml      Physical Exam  CVP 2.  General:  No resp difficulty HEENT: normal Neck: supple. no JVD. Carotids 2+ bilat; no bruits. No lymphadenopathy or  thryomegaly appreciated. Cor: PMI nondisplaced. Regular rate & rhythm. No rubs, gallops or murmurs. Lungs: clear Abdomen: soft, nontender, nondistended. No hepatosplenomegaly. No bruits or masses. Good bowel sounds. Extremities: no cyanosis, clubbing, rash, edema. RUE PICC  Neuro: alert & orientedx3, cranial nerves grossly intact. moves all 4 extremities w/o difficulty. Affect pleasant  Telemetry   NSR 80s personally reviewed.   Labs    CBC Recent Labs    12/10/20 0510 12/11/20 0340  WBC 7.8 8.4  HGB 8.2* 9.2*  HCT 26.1* 28.3*  MCV 87.6 86.8  PLT 190 706   Basic Metabolic Panel Recent Labs    12/10/20 0510 12/11/20 0340  NA 131* 132*  K 3.7 3.4*  CL 96* 98  CO2 29 28  GLUCOSE 97 118*  BUN 13 10  CREATININE 0.80 0.83  CALCIUM 7.8* 7.9*   Liver Function Tests No results for input(s): AST, ALT, ALKPHOS, BILITOT, PROT, ALBUMIN in the last 72 hours.  No results for input(s): LIPASE, AMYLASE in the last 72 hours. Cardiac Enzymes No results for input(s): CKTOTAL, CKMB, CKMBINDEX, TROPONINI in the last 72 hours.  BNP: BNP (last 3 results) Recent Labs    11/01/20 1224 11/29/20 2240 11/30/20 0144  BNP 462.1* 1,586.1* 2,653.1*    ProBNP (last 3 results) Recent Labs    03/23/20 0922  PROBNP 520*  D-Dimer No results for input(s): DDIMER in the last 72 hours. Hemoglobin A1C No results for input(s): HGBA1C in the last 72 hours.  Fasting Lipid Panel No results for input(s): CHOL, HDL, LDLCALC, TRIG, CHOLHDL, LDLDIRECT in the last 72 hours. Thyroid Function Tests No results for input(s): TSH, T4TOTAL, T3FREE, THYROIDAB in the last 72 hours.  Invalid input(s): FREET3  Other results:   Imaging    No results found.   Medications:     Scheduled Medications:  sodium chloride   Intravenous Once   apixaban  5 mg Oral BID   atorvastatin  80 mg Oral Daily   benzonatate  100 mg Oral BID   Chlorhexidine Gluconate Cloth  6 each Topical Daily    dapagliflozin propanediol  10 mg Oral Daily   digoxin  0.125 mg Oral Daily   docusate sodium  100 mg Oral BID   eplerenone  25 mg Oral Daily   feeding supplement (GLUCERNA SHAKE)  237 mL Oral TID BM   furosemide  40 mg Oral Daily   gabapentin  100 mg Oral TID   insulin aspart  0-15 Units Subcutaneous TID WC   insulin aspart  0-5 Units Subcutaneous QHS   ivabradine  7.5 mg Oral BID WC   melatonin  5 mg Oral QHS   pantoprazole  40 mg Oral Daily   polyethylene glycol  17 g Oral Daily   sodium chloride flush  10-40 mL Intracatheter Q12H   sodium chloride flush  3 mL Intravenous Q12H   sodium chloride flush  3 mL Intravenous Q12H   tamsulosin  0.4 mg Oral Daily    Infusions:  sodium chloride     sodium chloride Stopped (11/30/20 0631)   cefTRIAXone (ROCEPHIN)  IV 2 g (12/10/20 1206)    PRN Medications: sodium chloride, acetaminophen (TYLENOL) oral liquid 160 mg/5 mL, acetaminophen, alum & mag hydroxide-simeth, guaiFENesin-dextromethorphan, magnesium hydroxide, ondansetron (ZOFRAN) IV, oxyCODONE, sodium chloride flush, sodium chloride flush, sodium chloride flush, traZODone    Patient Profile   65 year old with a history of h/o CAD s/p previous MI, HTN, HL, ST jude ICD, S/P Barostim, and chronic systolic heart failure, EF 20-25%.  Presented 7/20 for outpatient RHC as part of VAD w/u. Post procedure, developed severe rigors, n/v and fever to 102 several hours after cardiac catheterization while in short stay. IVFs given for hypotension, labs and blood cx drawn. Admitted to ICU and started on broad spectrum  abx. Later developed acute hypoxic respiratory failure, requiring intubation. Blood Cx + for Strep.   TEE w/ with no evidence of endocarditis. 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 Likely low-flow low gradient AS. Mean gradient 18. VR = 0.19 VTI = 7.5. LVEF 20%. RV mild HK  Assessment/Plan   Strep Bacteremia - Blood cx + for Strep gordonii - Repeat cx 7/21 NGTD  - TEE w/ with no clear  evidence of endocarditis (fibroelastoma on Aov -> cannot completely exclude infection) - ID has seen - now on ceftiraxone.  - ICD extracted 7/25. Per EP (ok to leave Baromstim as it is extravascular) - CVC removed 7/26 - 7/26 Repeat CX - No growth - Plan for 6 weeks IV ceftriaxone. - PICC placed.   - 2. Septic Shock  - in setting of bacteremia and aspiration PNA - c/w abx per ID - see above - Now off pressors and stable.  - Resolved.   3. Acute Hypoxic Respiratory Failure - Resolved/extubated. - O2 sats stable on 2 liters [place] => on room  air with stable sats.    4. Chronic Systolic Heart Failure, ICM  - Echo 12/20 EF post anterior MI 30 to 35%. - Echo 6/21 EF 25-30% RV ok  - Echo 05/19/20 EF 20-25% RV mildly HK. moderate AS  Mean gradient 13 AVA 1.2 cm2 DI 0.30 - Initial RHC this admit with low filling pressures. CI marginal at 2.3 - Co-ox 54%  today off inotropes.  - No bb /entresto with soft BP.   - No BB with soft BP.   - Continue digoxin 0.125 , dig level < 0.2  - CVP 2-3 today. Does not need diuretics.  - Continue farxiga 10 mg daily - Continue to 25 mg eplerenone daily.   - Now off midodrine.  - Continue ivabradine 7.5 mg twice a day, HR in the 80s.  - concerned he will need advanced therapies. Likely not transplant candidate given age and lung disease. Being evaluated for LVAD, but currently not a candidate for implant given active infection/ bacteremia.  - S/P ICD extraction, 6 weeks IV abx.  - Home w/ LifeVest  5. CAD/NSTEMI  - s/p anterior STEMI 12/20. LHC showed chronically occluded RCA (with L>>R collaterals) and thrombotic occlusion of proximal LAD. Underwent PCI of LAD. - LHC this admit with stable CAD. No chest pain.  - Had NSTEMI in setting of septic shock - Continue ASA/statin.   6. Aortic Stenosis - AS is severe on TEE.  - Likely low-flow low gradient severe AS. Mean gradient 18. DI = 0.19 VTI = 7.5 - Presence of fibroelastoma prevents TAVR  7. RUE DVT  -  on Eliquis, swelling improved.     8. Urinary Retention - Foley placed 7/28 . Continue flomax.  - No further issues urinating.   - will need urology follow up.   [place] set up for home antibiotics. Will also need HHPT + 4 wheeled walker with seat.  Has LifeVest in the room for d/c   Length of Stay: [place], NP  12/11/2020, 9:40 AM  Advanced Heart Failure Team Pager [phone] (M-F; 7a - 5p)  Please contact [facility] for night-coverage after hours (5p -7a ) and weekends on [email or web address]  Patient seen and examined with the above-signed Advanced Practice Provider and/or Housestaff. I personally reviewed laboratory data, imaging studies and relevant notes. I independently examined the patient and formulated the important aspects of the plan. I have edited the note to reflect any of my changes or salient points. I have personally discussed the plan with the patient and/or family.  He is feeling better today. Co-ox marginal but sable off inotropes. CVP 3. Afebrile on IV abx. On Eliquis for DVT, ICD out. LifeVest set up   General:  Sitting up  No resp difficulty HEENT: normal Neck: supple. no JVD. Carotids 2+ bilat; no bruits. No lymphadenopathy or thryomegaly appreciated. Cor: PMI nondisplaced. Regular rate & rhythm. 2/6 AS Lungs: clear Abdomen: soft, nontender, nondistended. No hepatosplenomegaly. No bruits or masses. Good bowel sounds. Extremities: no cyanosis, clubbing, rash, edema Neuro: alert & orientedx3, cranial nerves grossly intact. moves all 4 extremities w/o difficulty. Affect pleasant  He is ok for d/c today with home abx and LifeVest. Would start lasix 81m on M/F. Will need close f/u with HF Clinic as we work toward advanced therapies.   DGlori Bickers MD  10:26 AM

## 2020-12-11 NOTE — Discharge Summary (Addendum)
Advanced Heart Failure Team  Discharge Summary   Patient ID: Brandon [name] MRN: [id number], DOB/AGE: 1955/09/24 65 y.o. Admit date: 11/29/2020 D/C date:     12/11/2020   Primary Discharge Diagnoses:  1.Strep Bacteremia 2. Septic Shock 3. Acute Hypoxic Respiratory Failure 4. Chronic Systolic Heart Failure, ICM 5. CAD/NSTEMI 6. Aortic Stenosis 7. RUE DVT 8. 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   Admitted 04/27/19 with anterior ST elevation. Taken to cath lab which showed chronically occluded RCA (L>>R collaterals) and thrombotic occlusion of proximal LAD. Underwent PCI of LAD. Echo w/ reduced LVEF 30-35% w/ apical aneurysm. RV ok. Post cath, he required milrinone for cardiogenic shock.   Now s/p Barostim. Saw Dr [name] In June 2022 and he described symptoms as NYHA III. Reds Clip 30%. Concerned he was headed toward advanced therapies. Also referred to Dr Burt Knack for TAVR. [name]   Presented 11/29/20 for scheduled RHC/LHC today. Cath showed low filling pressures, stable 2V CAD, severe ICM EF 20%, moderately reduced cardiac output. He returned to the short stay and had been there for ~ 3 hours then developed chills/ nausea/vomiting/dyspnea. Oxygen sats dropped into the low 70s and was hypotensive. Blood cultures obtained. Later that day respiratory status worsened requiring intubation.  Placed on pressors + broad spectrum antibiotics. Blood cultures showed strep gordonii. ID and EP consulted . He had ICD extraction on 12/04/20. Pressors gradually wean off.   He was given samples of ivabradine and patient assistance for was completed prior to discharge. No bb with shock. Consider restarting entresto as his follow up next week.   ID recommending ceftriaxone IV until 01/15/21 via PICC.Disharging with  PICC and [facility] [place] set for home antbiotics and [facility] for HHRN and HHPT. He will continue to be followed closely in the HF clinic.   See below for detailed problems list. He will contine to be followed closely in the HF clinic.    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 - ICD extracted 7/25. Per EP (ok to leave Baromstim as it is extravascular) - 7/26 Repeat CX - No growth - Plan for 6 weeks IV ceftriaxone. - 2nd set of  cultures negative so PICC line placed.   2. Septic Shock - in setting of bacteremia and aspiration PNA - c/w abx per ID - see above - Initially on pressors. Gradually weaned off.    3. Acute Hypoxic Respiratory Failure - Required intubation 7/20. Later extubated 7/232/22 - O2 sats stable on room air.    4. Chronic Systolic Heart Failure, ICM - Echo 12/20 EF post anterior MI 30 to 35%. - Echo 6/21 EF 25-30% RV ok - Echo 05/19/20 EF 20-25% RV mildly HK. moderate AS  Mean gradient 13 AVA 1.2 cm2 DI 0.30 - Initial RHC this admit with low filling pressures. CI marginal at 2.3 - Co-ox 54%  today off inotropes. - No bb /entresto with soft BP.   - Continue digoxin 0.125 , dig level < 0.2 - CVP 2-3 today. Can use lasix 40 mg twice a week.   - Continue farxiga 10 mg daily -  Continue to 25 mg eplerenone daily.   - Now off midodrine. - Continue ivabradine 7.5 mg twice a day, HR in the 80s.  - concerned he will need advanced therapies. Likely not transplant candidate given age and lung disease. Being evaluated for LVAD, but currently not a candidate for implant given active infection/ bacteremia. - S/P ICD extraction, 6 weeks IV abx. - Home w/ LifeVest   5. CAD/NSTEMI - s/p anterior STEMI 12/20. LHC showed chronically occluded RCA (with L>>R collaterals) and thrombotic  occlusion of proximal LAD. Underwent PCI of LAD. - LHC this admit with stable CAD. No chest pain.  - Had NSTEMI in setting of septic shock - Continue ASA/statin.   6. Aortic Stenosis - AS is severe on TEE. - Likely low-flow low gradient severe AS. Mean gradient 18. DI = 0.19 VTI = 7.5 - Presence of fibroelastoma prevents TAVR   7. RUE DVT  - on Eliquis, swelling improved.      8. Urinary Retention - Foley placed 7/28 . Continue flomax. - No further issues urinating.   - will need urology follow up.  Discharge Weight: 184 pounds.   Discharge Vitals: Blood pressure (!) 151/91, pulse 78, temperature 98 F (36.7 C), temperature source Oral, resp. rate 20, height 6' (1.829 m), weight 83.5 kg, SpO2 93 %.  Labs: Lab Results  Component Value Date   WBC 8.4 12/11/2020   HGB 9.2 (L) 12/11/2020   HCT 28.3 (L) 12/11/2020   MCV 86.8 12/11/2020   PLT 186 12/11/2020    Recent Labs  Lab 12/11/20 0340  NA 132*  K 3.4*  CL 98  CO2 28  BUN 10  CREATININE 0.83  CALCIUM 7.9*  GLUCOSE 118*   Lab Results  Component Value Date   CHOL 140 04/04/2020   HDL 57 04/04/2020   LDLCALC 69 04/04/2020   TRIG 99 11/30/2020   BNP (last 3 results) Recent Labs    11/01/20 1224 11/29/20 2240 11/30/20 0144  BNP 462.1* 1,586.1* 2,653.1*    ProBNP (last 3 results) Recent Labs    03/23/20 0922  PROBNP 520*     Diagnostic Studies/Procedures  TEE 11/30/20   1. Left ventricular ejection fraction, by estimation, is 20%. The left  ventricle has severely decreased function. The left ventricle demonstrates  global hypokinesis. The left ventricular internal cavity size was mildly  dilated.   2. No vegetation on ICD lead. Right ventricular systolic function is  mildly reduced. The right ventricular size is normal.   3. Left atrial size was severely dilated. No left atrial/left atrial  appendage thrombus was detected.   4. Right atrial size was mildly dilated.   5. The mitral valve is normal  in structure. Mild mitral valve  regurgitation.   6. Aortic valve is severely calcified with markedly decresed cusp  excursion. Likely low-flow low gradient AS with mean gradient29m HG. VR =  0.19. VTI = 7.5. There is a nodular density on the aortic side of the  valve on the [place] appears stable from  recent CT. Suspect fibroelastoma but given clincal scenario cannot exclude  vegetation      . The aortic valve is tricuspid. There is severe calcifcation of the  aortic valve. Aortic valve regurgitation is mild. Severe aortic valve  stenosis.   7. There is Severe (Grade IV)  plaque involving descending aorta    RHC/LHC 11/29/20   Prox Cx lesion is 50% stenosed.   Prox RCA to Mid RCA lesion is 100%  stenosed.   Ost RCA to Prox RCA lesion is 50% stenosed.   Non-stenotic Mid LAD lesion was previously treated.  Findings:  Ao = 81/50 (63) LV = 99/12 RA = 4 RV = 24/6 PA = 19/5 (14) PCW = 3 Fick cardiac output/index = 4.7/2.3 PVR = 2.3 WU Ao sat = 99% PA sat = 69%, 70%  Assessment: Stable 2v CAD Severe iCM EF 20% Low filling pressures with moderately reduced cardiac output Heavily calcified aortic valve (difficult to cross). Appears to be moderate by pressures but may have component of low-gradient, low-flow AS  Plan/Discussion:  Continue VAD w/u.   Discharge Medications   Allergies as of 12/11/2020   No Known Allergies      Medication List     STOP taking these medications    aspirin 81 MG chewable tablet   carvedilol 6.25 MG tablet Commonly known as: COREG   clopidogrel 75 MG tablet Commonly known as: PLAVIX   Entresto 24-26 MG Generic drug: sacubitril-valsartan       TAKE these medications    acetaminophen 500 MG tablet Commonly known as: TYLENOL Take 1,000 mg by mouth every 6 (six) hours as needed (pain.).   apixaban 5 MG Tabs tablet Commonly known as: ELIQUIS Take 1 tablet (5 mg total) by mouth 2 (two) times daily.   ARTIFICIAL TEARS OP Place 1  drop into both eyes daily as needed (dry/irritated eyes.).   atorvastatin 80 MG tablet Commonly known as: LIPITOR TAKE 1 TABLET BY MOUTH ONCE DAILY AT 6PM. What changed: See the new instructions.   cefTRIAXone  IVPB Commonly known as: ROCEPHIN Inject 2 g into the vein daily. Indication:  bacteremia  First Dose: No Last Day of Therapy:  01/15/2021 Labs - Once weekly:  CBC/D and BMP, Labs - Every other week:  ESR and CRP Method of administration: IV Push Method of administration may be changed at the discretion of home infusion pharmacist based upon assessment of the patient and/or caregiver's ability to self-administer the medication ordered.   citalopram 10 MG tablet Commonly known as: CELEXA Take 10 mg by mouth 2 (two) times a week.   dapagliflozin propanediol 10 MG Tabs tablet Commonly known as: Farxiga Take 1 tablet (10 mg total) by mouth in the morning.   digoxin 0.125 MG tablet Commonly known as: LANOXIN Take 1 tablet (0.125 mg total) by mouth daily. Start taking on: December 12, 2020   diphenhydrAMINE 25 MG tablet Commonly known as: BENADRYL Take 50 mg by mouth at bedtime.   eplerenone 50 MG tablet Commonly known as: INSPRA Take 0.5 tablets (25 mg total) by mouth in the morning. What changed: how much to take   ezetimibe 10 MG tablet Commonly known as: ZETIA TAKE ONE TABLET BY MOUTH DAILY What changed: when to take this   furosemide 40 MG tablet Commonly known as: LASIX Take 1 tablet (40 mg total) by mouth 2 (two) times a week. Every Mon-Fri Start taking on: December 15, 2020 What changed:  when to take this additional instructions These instructions start on December 15, 2020. If you are unsure what to do until then, ask your doctor or other care provider.   gabapentin 100 MG capsule Commonly known as: NEURONTIN Take 1 capsule (100 mg total) by mouth 3 (three) times daily.   Incruse Ellipta 62.5 MCG/INH Aepb Generic drug: umeclidinium bromide Inhale 1 puff into  the lungs daily. What changed:  when to take this reasons to take this   inFLIXimab in  sodium chloride 0.9 % Inject into the vein every 6 (six) weeks.   ivabradine 7.5 MG Tabs tablet Commonly known as: CORLANOR Take 1 tablet (7.5 mg total) by mouth 2 (two) times daily with a meal. What changed:  medication strength how much to take how to take this when to take this additional instructions   melatonin 5 MG Tabs Take 1 tablet (5 mg total) by mouth at bedtime.   pantoprazole 40 MG tablet Commonly known as: PROTONIX Take 1 tablet (40 mg total) by mouth daily. Start taking on: December 12, 2020   tamsulosin 0.4 MG Caps capsule Commonly known as: FLOMAX TAKE 1 CAPSULE(0.4 MG) BY MOUTH DAILY What changed: See the new instructions.               Durable Medical Equipment  (From admission, onward)           Start     Ordered   12/11/20 1009  For home use only DME oxygen  Once       Comments: As needed  Question Answer Comment  Length of Need 6 Months   Mode or (Route) Nasal cannula   Liters per Minute 2   Oxygen conserving device Yes   Oxygen delivery system Gas      12/11/20 1009   12/07/20 1253  For home use only DME 3 n 1  Once        12/07/20 1252   12/07/20 0820  For home use only DME 4 wheeled rolling walker with seat  Once       Question:  Patient needs a walker to treat with the following condition  Answer:  Heart failure ([place])   12/07/20 0820   12/07/20 0819  Heart failure home health orders  (Heart failure home health orders / Face to face)  Once       Comments: Heart Failure Follow-up Care:  Verify follow-up appointments per Patient Discharge Instructions. Confirm transportation arranged. Reconcile home medications with discharge medication list. Remove discontinued medications from use. Assist patient/caregiver to manage medications using pill box. Reinforce low sodium food selection Assessments: Vital signs and oxygen saturation at each  visit. Assess home environment for safety concerns, caregiver support and availability of low-sodium foods. Consult Education officer, museum, PT/OT, Dietitian, and CNA based on assessments. Perform comprehensive cardiopulmonary assessment. Notify MD for any change in condition or weight gain of 3 pounds in one day or 5 pounds in one week with symptoms. Daily Weights and Symptom Monitoring: Ensure patient has access to scales. Teach patient/caregiver to weigh daily before breakfast and after voiding using same scale and record.    Teach patient/caregiver to track weight and symptoms and when to notify Provider. Activity: Develop individualized activity plan with patient/caregiver.  Will need home antibiotics and HHPT.  Question Answer Comment  Heart Failure Follow-up Care Advanced Heart Failure (AHF) Clinic at [phone]   Lab frequency Other see comments   Fax lab results to Other see comments   Fax lab results to AHF Clinic at [phone]   Diet Low Sodium Heart Healthy   Fluid restrictions: 2000 mL Fluid      12/07/20 0819   12/05/20 0946  For home use only DME Vest life vest  Once       Comments: EF < 35% Duration 3 months   12/05/20 0945              Discharge Care Instructions  (From admission, onward)  Start     Ordered   12/11/20 0000  Change dressing on IV access line weekly and PRN  (Home infusion instructions - [facility] )        12/11/20 1143            Disposition   The patient will be discharged in stable condition to home. Discharge Instructions     (HEART FAILURE PATIENTS) Call MD:  Anytime you have any of the following symptoms: 1) 3 pound weight gain in 24 hours or 5 pounds in 1 week 2) shortness of breath, with or without a dry hacking cough 3) swelling in the hands, feet or stomach 4) if you have to sleep on extra pillows at night in order to breathe.   Complete by: As directed    [facility] pharmacist to adjust  dose for Vancomycin, Aminoglycosides and other anti-infective therapies as requested by physician.   Complete by: As directed    [facility] to provide Cath Flo 343m   Complete by: As directed    Administer for PICC line occlusion and as ordered by physician for other access device issues.   Anaphylaxis Kit: Provided to treat any anaphylactic reaction to the medication being provided to the patient if First Dose or when requested by physician   Complete by: As directed    Epinephrine 132mml vial / amp: Administer 0.43m54m0.43ml36mubcutaneously once for moderate to severe anaphylaxis, nurse to call physician and pharmacy when reaction occurs and call 911 if needed for immediate care   Diphenhydramine 50mg67mIV vial: Administer 25-50mg 24mM PRN for first dose reaction, rash, itching, mild reaction, nurse to call physician and pharmacy when reaction occurs   Sodium Chloride 0.9% NS 500ml I58mdminister if needed for hypovolemic blood pressure drop or as ordered by physician after call to physician with anaphylactic reaction   Change dressing on IV access line weekly and PRN   Complete by: As directed    Diet - low sodium heart healthy   Complete by: As directed    Flush IV access with Sodium Chloride 0.9% and Heparin 10 units/ml or 100 units/ml   Complete by: As directed    Home infusion instructions - [facility]   Complete by: As directed    Instructions: Flush IV access with Sodium Chloride 0.9% and Heparin 10units/ml or 100units/ml   Change dressing on IV access line: Weekly and PRN   Instructions Cath Flo 2mg: Ad66mister for PICC Line occlusion and as ordered by physician for other access device   [facility] pharmacist to adjust dose for: Vancomycin, Aminoglycosides and other anti-infective therapies as requested by physician   Increase activity slowly   Complete by: As directed    Method of administration may be changed at the discretion of home infusion  pharmacist based upon assessment of the patient and/or caregiver's ability to self-administer the medication ordered   Complete by: As directed        Follow-up Information     [facility]   Specialty: Cardiology Why: 12/14/20 @ 3:20PM, wound check/suture removal Contact information: [phone], [place].   Specialty: Cardiology Why: 01/31/21 @ 3:15PM Contact information: [address] [place] [address] [phone].  Why: Home Health RN  Someone from the home health agency will be in contact with you after you discharge to arrange your first home physical therapy appointment. Contact information: [place] [address] [phone]         [facility] Follow up.   Why: will arrange IV infusion/antibiotics        [name], Lavell Islam, MD Follow up.   Specialty: Infectious Diseases Why: 12/27/20 at 2:45 pm. Please call to re-schedule if you are not able to make this appointment. Contact information: [address] [place] [address] [phone]         [facility] Follow up.   Specialty: Cardiology Why: December 18, 2020 at 2:00 PM [facility], [place] [phone] Contact information: [address] [id number] [place] [phone]                  Duration of Discharge Encounter: Greater than 35 minutes   Signed, Darrick Grinder NP-C  12/11/2020, 11:44 AM   Patient seen and examined with the above-signed Advanced Practice Provider and/or Housestaff. I personally reviewed laboratory data, imaging studies and relevant notes. I independently examined the patient and formulated the important aspects of the plan. I have edited  the note to reflect any of my changes or salient points. I have personally discussed the plan with the patient and/or family.  See rounding note from earlier today. He is stable for d/c with LifeVest and PICC for home abx. Will follow closely in HF clinic for potential VAD.   Glori Bickers, MD  5:07 PM

## 2020-12-11 NOTE — TOC Transition Note (Addendum)
Transition of Care [facility]) - CM/SW Discharge Note   Patient Details  Name: Brandon [name] MRN: [id number] Date of Birth: May 07, 1956  Transition of Care [facility]) CM/SW Contact:  Erenest Rasher, RN Phone Number:[phone] 12/11/2020, 12:01 PM   Clinical Narrative:     TOC CM spoke to pt at bedside and he is ready for dc. His sister took his 26 and 3n1 bedside commode home. [place] for oxygen for home to deliver to room prior to dc. Contacted [facility] rep, Carolynn Sayers and [place], Ramond Marrow for dc home today with IV abx.   Life Vest was delivered by [employer] on last week.   Final next level of care: [place] Barriers to Discharge: No Barriers Identified   Patient Goals and CMS Choice Patient states their goals for this hospitalization and ongoing recovery are:: wants to remain independent CMS Medicare.gov Compare Post Acute Care list provided to:: Patient Choice offered to / list presented to : Patient  Discharge Placement                       Discharge Plan and Services In-house Referral: Clinical Social Work Discharge Planning Services: CM Consult Post Acute Care Choice: Home Health          DME Arranged: 3-N-1, Oxygen, Walker rolling with seat, Life vest DME Agency: [facility], [employer] Date DME Agency Contacted: 12/11/20 Time DME Agency Contacted: [phone] Representative spoke with at DME Agency: Melina Modena HH Arranged: PT, RN [facility] Agency: [place] [facility] Date HH Agency Contacted: 12/11/20 Time [place]: 1200 Representative spoke with at [place]: Carolynn Sayers, [facility] RN, Wylene Men RN  Social Determinants of Health (SDOH) Interventions     Readmission Risk Interventions No flowsheet data found.

## 2020-12-12 ENCOUNTER — Encounter (HOSPITAL_COMMUNITY): Payer: Self-pay

## 2020-12-12 ENCOUNTER — Other Ambulatory Visit (HOSPITAL_COMMUNITY): Payer: Self-pay

## 2020-12-12 ENCOUNTER — Telehealth (HOSPITAL_COMMUNITY): Payer: Self-pay

## 2020-12-12 NOTE — Telephone Encounter (Signed)
Transitions of Care Pharmacy  ° °Call attempted for a pharmacy transitions of care follow-up. HIPAA appropriate voicemail was left with call back information provided.  ° °Call attempt #1. Will follow-up in 2-3 days.  °  °

## 2020-12-13 ENCOUNTER — Telehealth (HOSPITAL_COMMUNITY): Payer: Self-pay

## 2020-12-13 ENCOUNTER — Other Ambulatory Visit (HOSPITAL_COMMUNITY): Payer: Self-pay

## 2020-12-13 NOTE — Telephone Encounter (Signed)
Transitions of Care Pharmacy   Call attempted for a pharmacy transitions of care follow-up. HIPAA appropriate voicemail was left with call back information provided.   Call attempt #2. Will follow-up in 2-3 days.    

## 2020-12-13 NOTE — Telephone Encounter (Signed)
Pharmacy Transitions of Care Follow-up Telephone Call  Date of discharge: 12/11/20  Discharge Diagnosis: Acute respiratory failure  How have you been since you were released from the hospital?   Patient has been doing well since discharge. No questions about meds at this time.  Medication changes made at discharge:      START taking: cefTRIAXone  IVPB (ROCEPHIN)  digoxin (LANOXIN)  Eliquis (apixaban)  gabapentin (NEURONTIN)  melatonin  pantoprazole (PROTONIX)   CHANGE how you take: eplerenone (INSPRA)  furosemide (LASIX)  ivabradine (CORLANOR)   STOP taking: aspirin 81 MG chewable tablet  carvedilol 6.25 MG tablet (COREG)  clopidogrel 75 MG tablet (PLAVIX)  Entresto 24-26 MG (sacubitril-valsartan)   Medication changes verified by the patient? Yes    Medication Accessibility:  Home Pharmacy:  [facility]  Was the patient provided with refills on discharged medications? Yes   Have all prescriptions been transferred from [facility] to home pharmacy?  Yes  Is the patient able to afford medications? Patient has [place] insurance    Medication Review:  APIXABAN (ELIQUIS)  Apixaban 5 mg BID initiated on 12/11/20. - Discussed importance of taking medication around the same time everyday  - Reviewed potential DDIs with patient  - Advised patient of medications to avoid (NSAIDs, ASA)  - Educated that Tylenol (acetaminophen) will be the preferred analgesic to prevent risk of bleeding  - Emphasized importance of monitoring for signs and symptoms of bleeding (abnormal bruising, prolonged bleeding, nose bleeds, bleeding from gums, discolored urine, black tarry stools)  - Advised patient to alert all providers of anticoagulation therapy prior to starting a new medication or having a procedure   Follow-up Appointments:  PCP Hospital f/u appt confirmed? None currently scheduled  Specialist Hospital f/u appt confirmed?  Scheduled to see Dr. Chalmers Cater on 12/25/20 @  Cardiology.   If their condition worsens, is the pt aware to call PCP or go to the Emergency Dept.? Yes  Final Patient Assessment: Patient has refills at home pharmacy and follow up scheduled

## 2020-12-14 ENCOUNTER — Telehealth: Payer: Self-pay | Admitting: Internal Medicine

## 2020-12-14 ENCOUNTER — Ambulatory Visit: Payer: Medicare Other

## 2020-12-14 NOTE — Telephone Encounter (Signed)
Spoke with his sister.  They state that the patient does not feel up to going out in heat today.  He has too much going on with picc line and life vest.  He would prefer to wait until next week for wound check visit.  Advised that visit is important as we need to start removing sutures.  Checked schedule for any earlier appts with AT, PA.  Pt agreeable to coming on 8/10 at 11:40  v/u that this appt cannot be delayed any further due to suture removal.

## 2020-12-14 NOTE — Telephone Encounter (Signed)
New message:     Patient sister calling to see if he can have his appt tomorrow for vv for his wound check.

## 2020-12-15 ENCOUNTER — Telehealth (HOSPITAL_COMMUNITY): Payer: Self-pay | Admitting: Pharmacy Technician

## 2020-12-15 NOTE — Telephone Encounter (Signed)
Advanced Heart Failure Patient Advocate Encounter  Patient called in today asking about Corlanor samples. Will have some for him to pick up on Mondays visit.   Did complain of weakness, not short of breath when sitting but has some shortness of breath when getting up. Appetite is up and down. Has to remember to make himself eat. No nausea.  Will route message to Dr. [name] to advise.  Charlann Boxer, CPhT

## 2020-12-15 NOTE — Telephone Encounter (Signed)
Advanced Heart Failure Patient Advocate Encounter  Sent in patient's [place] application for [employer] assistance

## 2020-12-16 NOTE — Progress Notes (Addendum)
Advanced [facility] Failure Clinic Note Date:  12/18/2020   ID:  Brandon [name], DOB 03/12/1956, MRN [id number]  Location: Home  Provider location: [facility] Type of Visit: Established patient  PCP:  [name], MD  Cardiologist:  [name], MD Primary HF: [name]  Chief Complaint: Post hospitalization heart failure follow-up   History of Present Illness:  Brandon [name] is a 65 y.o.smoker (originally from [place], [place]) with h/o CAD s/p previous MI, HTN, HL.   He was admitted 04/27/19 with anterior ST elevation. Taken to cath lab which showed chronically occluded RCA (L>>R collaterals) and thrombotic occlusion of proximal LAD. Underwent PCI of LAD. Echo w/ reduced LVEF 30-35% w/ apical aneurysm. RV ok. Post cath, he required milrinone for cardiogenic shock.   Now s/p Barostim.  Echo 6/22 F 20-25% RV mildly HK. Moderate to severe low-flow AS DI 0.34. Referred for TAVR evaluation. PFTs ordered.  Underwent outpatient [facility] 11/29/20 as part of VAD w/u. He had low filling pressures and CI marginal at 2.3. Post procedure, developed severe rigors, n/v and fever to 102. He was given IVF and started on broad spectrum abx. He was admitted to ICU for suspected sepsis where he then developed acute hypoxic respiratory failure, requiring intubation. Blood Cx + strep. 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  Likely low-flow low gradient AS. Mean gradient 18, RV mild HK. He required inotropic support and eventually  weaned off. Hospitalization also c/b RUE DVT and aspiration pneumonia. He underwent ICD extraction 12/04/20, but Barostim was left as it is extravascular. Per ID, he will complete 6 weeks of IV ceftriaxone. GDMT was limited by hypotension (Coreg and Entresto stopped) and need for midodrine. Home on lasix 40 mg on M/F. He was discharged with LifeVest, PICC & HH PT/RN. D/c weight 184 lbs.  Today he returns for  post hospitalization HF follow up, here with his sister and brother in law. Main complaint is right arm pain. Says Tylenol helped, asking about pain medicine. SOB with any activity. He wears 2L oxygen intermittently during the day. Walking some around the house. Denies CP, dizziness, edema. Sleeps in recliner due to back pain. Appetite fair. No fever or chills. Weight at home 176 pounds. Taking all medications.  Having some blurry vision. Lives alone.   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 Mean gradient 18. VR = 0.19 VTI = 7.5. RV mild HK  - R/LHC (11/29/20):    Prox Cx lesion is 50% stenosed.   Prox RCA to Mid RCA lesion is 100% stenosed.   Ost RCA to Prox RCA lesion is 50% stenosed.   Non-stenotic Mid LAD lesion was previously treated.   Findings:   Ao = 81/50 (63) LV = 99/12 RA = 4 RV = 24/6 PA = 19/5 (14) PCW = 3 Fick cardiac output/index = 4.7/2.3 PVR = 2.3 WU Ao sat = 99% PA sat = 69%, 70%   Assessment: Stable 2v CAD Severe iCM EF 20% Low filling pressures with moderately reduced cardiac output Heavily calcified aortic valve (difficult to cross). Appears to be moderate by pressures but may have component of low-gradient, low-flow AS  - Echo (11/01/20): EF 20-25% RV mildly HK. Moderate to severe low-flow AS DI 0.34 Aortic valve mean gradient measures 14.3 mmHg. Aortic valve peak gradient measures 24.3 mmHg. Aortic valve area, by VTI measures  1.44 cm.  - Echo (05/19/20): EF 20-25% RV mildly HK. moderate AS  Mean gradient 13 AVA 1.2 cm2DI 0.30   - Echo (04/27/19): EF 30-35% RV normal.  Moderate HK LV. Grade IDD.   - Echo (10/29/19): EF 25-30% RV ok. Personally reviewed  Past Medical History:  Diagnosis Date   AICD (automatic cardioverter/defibrillator) present 08/31/2019   Ankylosing spondylitis ([place])    Arthritis    BENIGN PROSTATIC HYPERTROPHY 06/07/2008   CHF  (congestive heart failure) ([place])    COLONIC POLYPS, HX OF 06/07/2008   Depression    H/O hiatal hernia    Heart failure ([place])    HYPERLIPIDEMIA 06/07/2008   HYPERTENSION 06/07/2008   Myocardial infarction [facility]) 2005   NSTEMI, s/p LAD stent   NEPHROLITHIASIS, HX OF 06/07/2008   STEMI (ST elevation myocardial infarction) ([place]) 04/27/2019   Past Surgical History:  Procedure Laterality Date   COLONOSCOPY WITH PROPOFOL N/A 04/24/2020   Procedure: COLONOSCOPY WITH PROPOFOL;  Surgeon: [name], MD;  Location: [facility];  Service: Gastroenterology;  Laterality: N/A;   CORONARY ANGIOPLASTY WITH STENT PLACEMENT  2005   LAD stent, jailed diagonal   CORONARY/GRAFT ACUTE MI REVASCULARIZATION N/A 04/27/2019   Procedure: Coronary/Graft Acute MI Revascularization;  Surgeon: [name], MD;  Location: [place] [facility];  Service: Cardiovascular;  Laterality: N/A;   ICD IMPLANT  08/31/2019   ICD IMPLANT N/A 08/31/2019   Procedure: ICD IMPLANT;  Surgeon: [name], MD;  Location: [place] [facility];  Service: Cardiovascular;  Laterality: N/A;   ICD LEAD REMOVAL N/A 12/04/2020   Procedure: ICD SYSTEM EXTRACTION;  Surgeon: Vickie Epley, MD;  Location: [place];  Service: Cardiovascular;  Laterality: N/A;   LAMINECTOMY     lumbar   LEFT HEART CATH AND CORONARY ANGIOGRAPHY N/A 04/27/2019   Procedure: LEFT HEART CATH AND CORONARY ANGIOGRAPHY;  Surgeon: [name], MD;  Location: [place] [facility];  Service: Cardiovascular;  Laterality: N/A;   LUMBAR FUSION  01/2012   POLYPECTOMY  04/24/2020   Procedure: POLYPECTOMY;  Surgeon: [name], MD;  Location: [facility];  Service: Gastroenterology;;   RIGHT HEART CATH N/A 04/27/2019   Procedure: RIGHT HEART CATH;  Surgeon: [name], Zaydin M, MD;  Location: [place] [facility];  Service: Cardiovascular;  Laterality: N/A;   RIGHT/LEFT HEART CATH AND CORONARY ANGIOGRAPHY N/A 11/29/2020   Procedure: RIGHT/LEFT HEART CATH  AND CORONARY ANGIOGRAPHY;  Surgeon: Jolaine Artist, MD;  Location: [place] [facility];  Service: Cardiovascular;  Laterality: N/A;   TEE WITHOUT CARDIOVERSION N/A 12/04/2020   Procedure: TRANSESOPHAGEAL ECHOCARDIOGRAM (TEE);  Surgeon: Vickie Epley, MD;  Location: [facility] OR;  Service: Cardiovascular;  Laterality: N/A;   TONSILLECTOMY     warthin tumor removal Left 2014   Current Outpatient Medications  Medication Sig Dispense Refill   acetaminophen (TYLENOL) 500 MG tablet Take 1,000 mg by mouth every 6 (six) hours as needed (pain.).     apixaban (ELIQUIS) 5 MG TABS tablet Take 1 tablet (5 mg total) by mouth 2 (two) times daily. 60 tablet 6   atorvastatin (LIPITOR) 80 MG tablet TAKE 1 TABLET BY MOUTH ONCE DAILY AT 6PM. 90 tablet 3   Carboxymethylcellulose Sodium (ARTIFICIAL TEARS OP) Place 1 drop into both eyes daily as needed (dry/irritated eyes.).     cefTRIAXone (ROCEPHIN) IVPB Inject 2 g into the vein daily. Indication:  bacteremia  First Dose: No Last Day of Therapy:  01/15/2021 Labs - Once weekly:  CBC/D  and BMP, Labs - Every other week:  ESR and CRP Method of administration: IV Push Method of administration may be changed at the discretion of home infusion pharmacist based upon assessment of the patient and/or caregiver's ability to self-administer the medication ordered. 41 Units 0   dapagliflozin propanediol (FARXIGA) 10 MG TABS tablet Take 1 tablet (10 mg total) by mouth in the morning. 30 tablet 6   digoxin (LANOXIN) 0.125 MG tablet Take 1 tablet (0.125 mg total) by mouth daily. 30 tablet 6   diphenhydrAMINE (BENADRYL) 25 MG tablet Take 50 mg by mouth at bedtime.     eplerenone (INSPRA) 50 MG tablet Take 0.5 tablets (25 mg total) by mouth in the morning. 30 tablet 6   ezetimibe (ZETIA) 10 MG tablet TAKE ONE TABLET BY MOUTH DAILY 90 tablet 3   furosemide (LASIX) 40 MG tablet Take 40 mg by mouth daily.     gabapentin (NEURONTIN) 100 MG capsule Take 1 capsule (100 mg total) by  mouth 3 (three) times daily. 90 capsule 0   ivabradine (CORLANOR) 7.5 MG TABS tablet Take 1 tablet (7.5 mg total) by mouth 2 (two) times daily with a meal. 60 tablet 6   melatonin 5 MG TABS Take 1 tablet (5 mg total) by mouth at bedtime. 30 tablet 6   pantoprazole (PROTONIX) 40 MG tablet Take 1 tablet (40 mg total) by mouth daily. 30 tablet 6   tamsulosin (FLOMAX) 0.4 MG CAPS capsule TAKE 1 CAPSULE(0.4 MG) BY MOUTH DAILY 90 capsule 1   umeclidinium bromide (INCRUSE ELLIPTA) 62.5 MCG/INH AEPB Inhale 1 puff into the lungs daily. 30 each 3   inFLIXimab in sodium chloride 0.9 % Inject into the vein every 6 (six) weeks. (Patient not taking: Reported on 12/18/2020)     No current facility-administered medications for this encounter.   Allergies:   Patient has no known allergies.   Social History:  The patient  reports that he quit smoking about 16 months ago. His smoking use included cigarettes. He has a 40.00 pack-year smoking history. He has never used smokeless tobacco. He reports current alcohol use of about 7.0 standard drinks of alcohol per week. He reports current drug use.   Family History:  The patient's family history includes Arthritis in his sister; Depression in his father; Heart failure in his mother.   ROS:  Please see the history of present illness.   All other systems are personally reviewed and negative.   Recent Labs: 03/23/2020: NT-Pro BNP 520 11/30/2020: ALT 70; B Natriuretic Peptide 2,653.1 12/06/2020: Magnesium 1.9 12/11/2020: BUN 10; Creatinine, Ser 0.83; Hemoglobin 9.2; Platelets 186; Potassium 3.4; Sodium 132  Personally reviewed   Wt Readings from Last 3 Encounters:  12/18/20 80.6 kg (177 lb 12.8 oz)  12/11/20 83.5 kg (184 lb 1.4 oz)  11/15/20 85.2 kg (187 lb 12.8 oz)   BP 100/64   Pulse 97   Wt 80.6 kg (177 lb 12.8 oz)   SpO2 92%   BMI 24.11 kg/m    Exam:   General:  NAD. No resp difficulty, thin, weak-appearing. HEENT: Normal Neck: Supple. No JVD. Carotids 2+  bilat; no bruits. No lymphadenopathy or thryomegaly appreciated. Cor: PMI nondisplaced. Regular rate & rhythm. No rubs, gallops, II/VI SEM RUSB Lungs: Diminished lower lobes. Abdomen: Soft, nontender, nondistended. No hepatosplenomegaly. No bruits or masses. Good bowel sounds. Extremities: No cyanosis, clubbing, rash, edema; LUE PICC Neuro: Alert & oriented x 3, cranial nerves grossly intact. Moves all 4 extremities w/o difficulty. Anxious  ECG: Per LifeVest recording, pt in SR today (interference on EKG machine from LifeVest). LifeVest Interrogation: No treatments, average HR 82, daily steps ~383  ASSESSMENT AND PLAN:  1. Chronic Systolic Heart Failure, ICM - Echo 12/20 EF post anterior MI 30 to 35%. - Echo 6/21 EF 25-30% RV ok - Echo 05/19/20 EF 20-25% RV mildly HK. moderate AS  Mean gradient 13 AVA 1.2 cm2 DI 0.30 - RHC this admit (7/22): EF 20%, low filling pressures. CI marginal at 2.3. - NYHA III, although functional class difficult as he is not very active. Volume good today. - No bb /Entresto with soft BP today.  - Change lasix to 40 mg every other day. - Continue digoxin 0.125 mg daily. - Continue Farxiga 10 mg daily - Continue eplerenone 25 mg daily.   - Continue ivabradine 7.5 mg bid, HR 97 today.  - He will likely need advanced therapies. Likely not transplant candidate given age and lung disease. Being evaluated for LVAD, but currently not a candidate for implant given active infection/bacteremia. - S/P ICD extraction, 6 weeks IV abx. - Continue LifeVest. - BMET, CBC and dig level.   2. Strep Bacteremia - Blood cx + for Strep gordonii last admit (7/22). 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 Per EP (ok to leave Baromstim as it is extravascular). - Continue 6 weeks IV ceftriaxone.  3. CAD - s/p anterior STEMI (12/20). LHC showed chronically occluded RCA (with L>>R  collaterals) and thrombotic occlusion of proximal LAD. Underwent PCI of LAD. - LHC (7/22) with stable CAD.  - Had NSTEMI in setting of sepsis. - No CP. - Continue ASA/statin.   4. Aortic Stenosis - AS is severe on TEE. - Likely low-flow low gradient severe AS. Mean gradient 18. DI = 0.19 VTI = 7.5 - Presence of fibroelastoma prevents TAVR.  5.  Hyperlipidemia: - Now followed by Dr. Debara Pickett in [facility] and 10 mg Zetia added. Consider PCSK9i - Last LDL 69   6. RUE DVT - On Eliquis. - No bleeding issues.  - Still w/ right arm pain. - Lidoderm patch, warm compress & extra strength tylenol.  7. Tobacco abuse - Former smoker. - Remains abstinent.  8. ETOH  - He has quit drinking.    9. Anxiety - Start sertraline 25 mg daily. May increase to 50 mg daily after 3-4 weeks. - Encouraged counseling.   Advised he follow up with his eye doctor regarding his blurred vision.   - Follow up with Dr. [name] as scheduled in 4 weeks. Anticipate adding Entresto or low-dose losartan +/- low dose beta blocker).   Frankey Poot, FNP  12/18/2020 2:36 PM  [facility] [address] Heart [facility] [place] [address] [phone] (office) [phone] (fax)  Patient seen and examined with the above-signed Advanced Practice Provider and/or Housestaff. I personally reviewed laboratory data, imaging studies and relevant notes. I independently examined the patient and formulated the important aspects of the plan. I have edited the note to reflect any of my changes or salient points. I have personally discussed the plan with the patient and/or family.  65 y/o male with advanced systolic HF due to I CM EF 20% and severe low-gradient AS recently discharged from the hospital after treatment for strep endocarditis with removal of ICD. Now on home IV abx. Remains weak but is getting stronger and now  able to ambulate the house. Volume  overload has improved with restarting diuretics. No firings of LifeVest. Denies fevers or chills. Main complaint is pain in right AC fossa.  General:  Weak appearing. No resp difficulty HEENT: normal Neck: supple. no JVD. Carotids 2+ bilat; + bruits. No lymphadenopathy or thryomegaly appreciated. Cor: PMI nondisplaced. Regular rate & rhythm. 2/6 AS S2 diminished +tunneled PICC Lungs: clear Abdomen: soft, nontender, nondistended. No hepatosplenomegaly. No bruits or masses. Good bowel sounds. Extremities: no cyanosis, clubbing, rash, edema  Neuro: alert & orientedx3, cranial nerves grossly intact. moves all 4 extremities w/o difficulty. Affect pleasant  He is doing well with IV abx. No evidence of ongoing infection. NYHA III. Volume status ok. Will cut back lasix to qod in setting of severe AS. Long discussion about next steps. Once IV abx complete will check surveillance cx. If surveillance cultures negative will resume VAD w/u. Sister says he has had bad reaction to tramadol in past. Will use tylenol and lidocaine patch. Agree with sertraline for depression/anxiety.   Total time spent 35 minutes. Over half that time spent discussing above.   Glori Bickers, MD  10:49 PM

## 2020-12-18 ENCOUNTER — Other Ambulatory Visit (HOSPITAL_COMMUNITY): Payer: Self-pay

## 2020-12-18 ENCOUNTER — Other Ambulatory Visit: Payer: Self-pay

## 2020-12-18 ENCOUNTER — Encounter: Payer: Self-pay | Admitting: Infectious Disease

## 2020-12-18 ENCOUNTER — Ambulatory Visit (HOSPITAL_COMMUNITY)
Admission: RE | Admit: 2020-12-18 | Discharge: 2020-12-18 | Disposition: A | Discharge status: Home / Self Care | Payer: Medicare Other | Source: Ambulatory Visit | Attending: Family Medicine | Admitting: Family Medicine

## 2020-12-18 ENCOUNTER — Encounter (HOSPITAL_COMMUNITY): Payer: Self-pay

## 2020-12-18 VITALS — BP 100/64 | HR 97 | Wt 177.8 lb

## 2020-12-18 DIAGNOSIS — I2510 Atherosclerotic heart disease of native coronary artery without angina pectoris: Secondary | ICD-10-CM | POA: Diagnosis not present

## 2020-12-18 DIAGNOSIS — I350 Nonrheumatic aortic (valve) stenosis: Secondary | ICD-10-CM | POA: Insufficient documentation

## 2020-12-18 DIAGNOSIS — Z79899 Other long term (current) drug therapy: Secondary | ICD-10-CM | POA: Diagnosis not present

## 2020-12-18 DIAGNOSIS — R7881 Bacteremia: Secondary | ICD-10-CM | POA: Diagnosis not present

## 2020-12-18 DIAGNOSIS — I252 Old myocardial infarction: Secondary | ICD-10-CM | POA: Diagnosis not present

## 2020-12-18 DIAGNOSIS — F419 Anxiety disorder, unspecified: Secondary | ICD-10-CM | POA: Diagnosis not present

## 2020-12-18 DIAGNOSIS — I82621 Acute embolism and thrombosis of deep veins of right upper extremity: Secondary | ICD-10-CM | POA: Insufficient documentation

## 2020-12-18 DIAGNOSIS — E785 Hyperlipidemia, unspecified: Secondary | ICD-10-CM | POA: Diagnosis not present

## 2020-12-18 DIAGNOSIS — M79601 Pain in right arm: Secondary | ICD-10-CM | POA: Diagnosis not present

## 2020-12-18 DIAGNOSIS — H538 Other visual disturbances: Secondary | ICD-10-CM | POA: Diagnosis not present

## 2020-12-18 DIAGNOSIS — Z7901 Long term (current) use of anticoagulants: Secondary | ICD-10-CM | POA: Insufficient documentation

## 2020-12-18 DIAGNOSIS — Z87891 Personal history of nicotine dependence: Secondary | ICD-10-CM | POA: Insufficient documentation

## 2020-12-18 DIAGNOSIS — Z8249 Family history of ischemic heart disease and other diseases of the circulatory system: Secondary | ICD-10-CM | POA: Diagnosis not present

## 2020-12-18 DIAGNOSIS — Z95810 Presence of automatic (implantable) cardiac defibrillator: Secondary | ICD-10-CM | POA: Diagnosis not present

## 2020-12-18 DIAGNOSIS — I330 Acute and subacute infective endocarditis: Secondary | ICD-10-CM | POA: Diagnosis not present

## 2020-12-18 DIAGNOSIS — I110 Hypertensive heart disease with heart failure: Secondary | ICD-10-CM | POA: Insufficient documentation

## 2020-12-18 DIAGNOSIS — I255 Ischemic cardiomyopathy: Secondary | ICD-10-CM | POA: Insufficient documentation

## 2020-12-18 DIAGNOSIS — I5022 Chronic systolic (congestive) heart failure: Secondary | ICD-10-CM | POA: Diagnosis present

## 2020-12-18 LAB — CBC
HCT: 34.4 % — ABNORMAL LOW (ref 39.0–52.0)
Hemoglobin: 10.8 g/dL — ABNORMAL LOW (ref 13.0–17.0)
MCH: 27.4 pg (ref 26.0–34.0)
MCHC: 31.4 g/dL (ref 30.0–36.0)
MCV: 87.3 fL (ref 80.0–100.0)
Platelets: 211 10*3/uL (ref 150–400)
RBC: 3.94 MIL/uL — ABNORMAL LOW (ref 4.22–5.81)
RDW: 15.7 % — ABNORMAL HIGH (ref 11.5–15.5)
WBC: 7.5 10*3/uL (ref 4.0–10.5)
nRBC: 0 % (ref 0.0–0.2)

## 2020-12-18 LAB — DIGOXIN LEVEL: Digoxin Level: 0.6 ng/mL — ABNORMAL LOW (ref 0.8–2.0)

## 2020-12-18 LAB — BASIC METABOLIC PANEL
Anion gap: 9 (ref 5–15)
BUN: 9 mg/dL (ref 8–23)
CO2: 26 mmol/L (ref 22–32)
Calcium: 8.7 mg/dL — ABNORMAL LOW (ref 8.9–10.3)
Chloride: 100 mmol/L (ref 98–111)
Creatinine, Ser: 0.73 mg/dL (ref 0.61–1.24)
GFR, Estimated: >60 mL/min (ref >60)
Glucose, Bld: 92 mg/dL (ref 70–99)
Potassium: 3.5 mmol/L (ref 3.5–5.1)
Sodium: 135 mmol/L (ref 135–145)

## 2020-12-18 MED ORDER — FUROSEMIDE 40 MG PO TABS: 40.0000 mg | ORAL_TABLET | ORAL | 3 refills | Status: DC

## 2020-12-18 MED ORDER — LIDOCAINE 5 % EX PTCH: 1.0000 | MEDICATED_PATCH | Freq: Two times a day (BID) | CUTANEOUS | 0 refills | Status: DC

## 2020-12-18 MED ORDER — SERTRALINE HCL 25 MG PO TABS: ORAL_TABLET | ORAL | 1 refills | Status: DC

## 2020-12-18 NOTE — Patient Instructions (Addendum)
Labs today We will only contact you if something comes back abnormal or we need to make some changes. Otherwise no news is good news!  START Sertraline 25 mg, one tab daily for 21 days, then you may increase to 50 mg daily  START Lidocaine patches- one patch every 12 hours as needed CHANGE Lasix to 40 mg, one tab every other day   Keep followup as scheduled

## 2020-12-19 ENCOUNTER — Other Ambulatory Visit (HOSPITAL_COMMUNITY): Payer: Self-pay

## 2020-12-19 NOTE — Progress Notes (Signed)
MCS EDUCATION NOTE:                VAD evaluation consent reviewed by patient and sister. Family needs to discuss who designated caregiver would be in case of patient getting LVAD.   Initial VAD teaching completed with pt, sister, and brother-in-law.    VAD educational packet including "Understanding Your Options with Advanced Heart Failure", "[facility] Patient Agreement for VAD Evaluation and Potential Implantation" consent, and [employer] "Heartmate 3 Left Ventricular Device (LVAD) Patient Guide", Heartmate 3 Left Ventricular Assist System Patient Education Program DVD", "[place] HM III Patient Education", "[place] Mechanical Circulatory Support Program", and "Decision Aids for Left Ventricular Assist Device" reviewed in detail and left at bedside for continued reference.  All questions answered regarding VAD implant, hospital stay, and what to expect when discharged home living with a heart pump. Pt and sister say he has "plenty of options" for caregiver, but they haven't discussed yet who that might be. He lives alone but has relatives that will help. Explained need for 24/7 care when pt is discharged home due to sternal precautions, adaptation to living on support, emotional support, consistent and meticulous exit site care and management, medication adherence and high volume of follow up visits with the [facility] after discharge; both pt and family verbalized understanding of above.   Provided brief equipment overview and demonstration with HeartMate III training loop including discussion on the following:   a) power module   b) system controller   c) universal Charity fundraiser   d) battery clips   e) Batteries   f) Percutaneous lead   Demonstrated and discussed:  a) changing power source on system controller from tethered (Power Module) to untethered (battery) mode   b) how to monitor battery life both on the system controller and on each individual battery   c) changing batteries     Identified the following lifestyle modifications while living on MCS:   1. No driving for at least six weeks and then only if doctor gives permission to do so.   2. No tub baths while pump implanted, and shower only when doctor gives permission.   3. No swimming or submersion in water while implanted with pump.   4. No contact sports or engaging in jumping activities.   5. Always have a backup controller, charged spare batteries, and battery clips nearby at all times in case of emergency.   6. Exit site care including dressing changes, monitoring for infection, and importance of keeping percutaneous lead stabilized at all times.    Intermacs patient survival statistics through March 2022 reviewed with patient and caregiver as follows:                                   The patient understands that from this discussion it does not mean that they will receive the device, but that depends on an extensive evaluation process. Both patient and sister state they are "overwhelmed" and "exhausted" today and are unable to make decisions.   Dr. [name] in room and spoke with patient and family at length. He encouraged patient to increase activity level daily as tolerated and to call if any problems before next visit.   All questions have been answered at this time and contact information was provided should they encounter any further questions. They will take the VAD education packet home and review. VAD Coordinator will meet with  them at their next visit with Dr. [name] to continue education/discussion re: LVAD.   Zada Girt, RN VAD Coordinator   Office: [phone] 24/7 VAD Pager: [phone]

## 2020-12-19 NOTE — Telephone Encounter (Signed)
Advanced Heart Failure Patient Advocate Encounter  Called [employer] yesterday to check the status of the patient's application. Representative stated that the medication required PA and they would need the denial and an appeal denial. I explained that there was no PA needed, the co-pay was $288. The representative was not willing to reach out to insurance to confirm  I attempted a PA for Corlanor on CMM, since the medication is already approved there was no questions to answer.  Called [employer] back today. Spoke with another representative who was able to call insurance and get a determination of benefits. All other information has been provided. Should have a determination within 24-48 hours.

## 2020-12-19 NOTE — Addendum Note (Signed)
Encounter addended by: Lezlie Octave, RN on: 12/19/2020 9:32 AM  Actions taken: Clinical Note Signed

## 2020-12-20 ENCOUNTER — Ambulatory Visit: Payer: Medicare Other

## 2020-12-20 ENCOUNTER — Other Ambulatory Visit: Payer: Self-pay

## 2020-12-20 DIAGNOSIS — T827XXD Infection and inflammatory reaction due to other cardiac and vascular devices, implants and grafts, subsequent encounter: Secondary | ICD-10-CM

## 2020-12-20 NOTE — Progress Notes (Signed)
Wound check appointment S/P device and lead extraction 12/04/20. Sutures removed. Wound without redness or edema. No s/s of infection. Incision edges approximated, wound well healed. Compliant with LifeVest. Follow up with Dr. Rayann Heman 01/31/21 to discuss re-implant.

## 2020-12-20 NOTE — Telephone Encounter (Signed)
Advanced Heart Failure Patient Advocate Encounter   Patient was approved to receive Corlanor from [facility].  Patient ID: [id number] Effective dates: 12/19/20 through 05/12/21  Called patient regarding approval. He was at an appointment and will call back.   Looks like the patient was prescribed Lidocaine 5% patches, those will not be approved through a PA. The patient would need to buy 4% patches OTC. Will recommend when he calls back.

## 2020-12-20 NOTE — Telephone Encounter (Signed)
Advanced Heart Failure Patient Advocate Encounter  Spoke with patient regarding Corlanor assistance approval and Lidocaine patch OTC. Provided phone number to [facility] [phone].  Patient agreeable to getting Lidocaine OTC.  Charlann Boxer, CPhT

## 2020-12-21 ENCOUNTER — Telehealth: Payer: Self-pay

## 2020-12-21 NOTE — Telephone Encounter (Signed)
PTA called and stated Patient canceled visit  didn't want PTA to come at all nor come to check vitals.  [place] Physical therapy assistant [phone]

## 2020-12-21 NOTE — Progress Notes (Signed)
Remote ICD transmission.   

## 2020-12-22 ENCOUNTER — Other Ambulatory Visit: Payer: Self-pay

## 2020-12-22 ENCOUNTER — Encounter: Payer: Self-pay | Admitting: Internal Medicine

## 2020-12-22 ENCOUNTER — Telehealth (HOSPITAL_COMMUNITY): Payer: Self-pay | Admitting: *Deleted

## 2020-12-22 ENCOUNTER — Ambulatory Visit (HOSPITAL_COMMUNITY)
Admission: RE | Admit: 2020-12-22 | Discharge: 2020-12-22 | Disposition: A | Discharge status: Home / Self Care | Payer: Medicare Other | Source: Ambulatory Visit | Attending: Internal Medicine | Admitting: Internal Medicine

## 2020-12-22 DIAGNOSIS — Z0130 Encounter for examination of blood pressure without abnormal findings: Secondary | ICD-10-CM | POA: Insufficient documentation

## 2020-12-22 NOTE — Telephone Encounter (Signed)
Per Darden Palmer pt can come in for REDS clip today . Pt aware and appt scheduled.

## 2020-12-22 NOTE — Telephone Encounter (Signed)
Entered in error

## 2020-12-22 NOTE — Progress Notes (Signed)
ReDS Vest / Clip - 12/22/20 1607       ReDS Vest / Clip   Station Marker D    Ruler Value 30    ReDS Value Range Low volume    ReDS Actual Value 24

## 2020-12-22 NOTE — Telephone Encounter (Signed)
Pt called c/o more shortness of breath, nausea, fatigue, and said this is the worst he's felt since being discharged from the hospital. Pt said his Zoll life vest was beeping around 5am this morning and he wondered know if he had an abnormal heart rhythm at that time. Pt denies chest pain, swelling/edema. Pt asked that I send message to a provider he said that he is scared because something just isn't right.   Routed to [facility] for advice

## 2020-12-24 ENCOUNTER — Encounter (HOSPITAL_COMMUNITY): Payer: Self-pay

## 2020-12-24 NOTE — Progress Notes (Signed)
Electrophysiology Office Note Date: 12/25/2020  ID:  Brandon [name], DOB 1956/02/13, MRN [id number]  PCP: [name], MD Primary Cardiologist: [name], MD Electrophysiologist: [name], MD  Primary HF: Dr. [name]   CC: Barostim and ICD follow up  Brandon [name] is a 65 y.o. male seen today for barostim and ICD follow up. he is doing well since implantation. He has been set at 7.0 ma for his final programming.   Admitted 7/20 - 12/11/20 with strep bacteremia. S/p ICD extraction 12/04/20. Will continue IV ABX into September  Today, he reports doing well overall. Primary complaint s/p recent admission remains fatigue. IV ABX on-going. Follows up with HF team and ID next month. SOB remains overall well controlled. No Orhopnea or PND.   Device History: Barostim (standard) implanted 12/31/2019 for Chronic systolic CHF St. Jude Single Chamber ICD implanted 08/2019 for chronic systolic CHF/primary prevention *Extracted 12/04/20 2/2 strep bacteremia* History of appropriate therapy: No History of AAD therapy: No    Past Medical History:  Diagnosis Date   AICD (automatic cardioverter/defibrillator) present 08/31/2019   Ankylosing spondylitis ([place])    Arthritis    BENIGN PROSTATIC HYPERTROPHY 06/07/2008   CHF (congestive heart failure) ([place])    COLONIC POLYPS, HX OF 06/07/2008   Depression    H/O hiatal hernia    Heart failure ([place])    HYPERLIPIDEMIA 06/07/2008   HYPERTENSION 06/07/2008   Myocardial infarction [facility]) 2005   NSTEMI, s/p LAD stent   NEPHROLITHIASIS, HX OF 06/07/2008   STEMI (ST elevation myocardial infarction) ([place]) 04/27/2019   Past Surgical History:  Procedure Laterality Date   COLONOSCOPY WITH PROPOFOL N/A 04/24/2020   Procedure: COLONOSCOPY WITH PROPOFOL;  Surgeon: [name], MD;  Location: [facility];  Service: Gastroenterology;  Laterality: N/A;   CORONARY ANGIOPLASTY WITH STENT PLACEMENT  2005   LAD stent, jailed diagonal    CORONARY/GRAFT ACUTE MI REVASCULARIZATION N/A 04/27/2019   Procedure: Coronary/Graft Acute MI Revascularization;  Surgeon: [name], MD;  Location: [place] [facility];  Service: Cardiovascular;  Laterality: N/A;   ICD IMPLANT  08/31/2019   ICD IMPLANT N/A 08/31/2019   Procedure: ICD IMPLANT;  Surgeon: [name], MD;  Location: [place] [facility];  Service: Cardiovascular;  Laterality: N/A;   ICD LEAD REMOVAL N/A 12/04/2020   Procedure: ICD SYSTEM EXTRACTION;  Surgeon: Vickie Epley, MD;  Location: [place];  Service: Cardiovascular;  Laterality: N/A;   LAMINECTOMY     lumbar   LEFT HEART CATH AND CORONARY ANGIOGRAPHY N/A 04/27/2019   Procedure: LEFT HEART CATH AND CORONARY ANGIOGRAPHY;  Surgeon: [name], MD;  Location: [place] [facility];  Service: Cardiovascular;  Laterality: N/A;   LUMBAR FUSION  01/2012   POLYPECTOMY  04/24/2020   Procedure: POLYPECTOMY;  Surgeon: [name], MD;  Location: [facility];  Service: Gastroenterology;;   RIGHT HEART CATH N/A 04/27/2019   Procedure: RIGHT HEART CATH;  Surgeon: [name], Mackie M, MD;  Location: [place] [facility];  Service: Cardiovascular;  Laterality: N/A;   RIGHT/LEFT HEART CATH AND CORONARY ANGIOGRAPHY N/A 11/29/2020   Procedure: RIGHT/LEFT HEART CATH AND CORONARY ANGIOGRAPHY;  Surgeon: Jolaine Artist, MD;  Location: [place] [facility];  Service: Cardiovascular;  Laterality: N/A;   TEE WITHOUT CARDIOVERSION N/A 12/04/2020   Procedure: TRANSESOPHAGEAL ECHOCARDIOGRAM (TEE);  Surgeon: Vickie Epley, MD;  Location: [place];  Service: Cardiovascular;  Laterality: N/A;   TONSILLECTOMY     warthin tumor  removal Left 2014    Current Outpatient Medications  Medication Sig Dispense Refill   acetaminophen (TYLENOL) 500 MG tablet Take 1,000 mg by mouth every 6 (six) hours as needed (pain.).     apixaban (ELIQUIS) 5 MG TABS tablet Take 1 tablet (5 mg total) by mouth 2 (two) times daily. 60 tablet 6    atorvastatin (LIPITOR) 80 MG tablet TAKE 1 TABLET BY MOUTH ONCE DAILY AT 6PM. 90 tablet 3   Carboxymethylcellulose Sodium (ARTIFICIAL TEARS OP) Place 1 drop into both eyes daily as needed (dry/irritated eyes.).     cefTRIAXone (ROCEPHIN) IVPB Inject 2 g into the vein daily. Indication:  bacteremia  First Dose: No Last Day of Therapy:  01/15/2021 Labs - Once weekly:  CBC/D and BMP, Labs - Every other week:  ESR and CRP Method of administration: IV Push Method of administration may be changed at the discretion of home infusion pharmacist based upon assessment of the patient and/or caregiver's ability to self-administer the medication ordered. 41 Units 0   dapagliflozin propanediol (FARXIGA) 10 MG TABS tablet Take 1 tablet (10 mg total) by mouth in the morning. 30 tablet 6   digoxin (LANOXIN) 0.125 MG tablet Take 1 tablet (0.125 mg total) by mouth daily. 30 tablet 6   diphenhydrAMINE (BENADRYL) 25 MG tablet Take 50 mg by mouth at bedtime.     eplerenone (INSPRA) 50 MG tablet Take 0.5 tablets (25 mg total) by mouth in the morning. 30 tablet 6   ezetimibe (ZETIA) 10 MG tablet TAKE ONE TABLET BY MOUTH DAILY 90 tablet 3   furosemide (LASIX) 40 MG tablet Take 1 tablet (40 mg total) by mouth every other day. 15 tablet 3   gabapentin (NEURONTIN) 100 MG capsule Take 1 capsule (100 mg total) by mouth 3 (three) times daily. 90 capsule 0   inFLIXimab in sodium chloride 0.9 % Inject into the vein every 6 (six) weeks.     ivabradine (CORLANOR) 7.5 MG TABS tablet Take 1 tablet (7.5 mg total) by mouth 2 (two) times daily with a meal. 60 tablet 6   melatonin 5 MG TABS Take 1 tablet (5 mg total) by mouth at bedtime. 30 tablet 6   pantoprazole (PROTONIX) 40 MG tablet Take 1 tablet (40 mg total) by mouth daily. 30 tablet 6   sertraline (ZOLOFT) 25 MG tablet Take 1 tablet (25 mg total) by mouth daily for 21 days, THEN 2 tablets (50 mg total) daily. 201 tablet 1   tamsulosin (FLOMAX) 0.4 MG CAPS capsule TAKE 1  CAPSULE(0.4 MG) BY MOUTH DAILY 90 capsule 1   No current facility-administered medications for this visit.    Allergies:   Patient has no known allergies.   Social History: Social History   Socioeconomic History   Marital status: Single    Spouse name: Not on file   Number of children: Not on file   Years of education: Not on file   Highest education level: Not on file  Occupational History   Occupation: Best boy: [employer]  Tobacco Use   Smoking status: Former    Packs/day: 1.00    Years: 40.00    Pack years: 40.00    Types: Cigarettes    Quit date: 07/26/2019    Years since quitting: 1.4   Smokeless tobacco: Never   Tobacco comments:    1-1.5 pks per year x 40-45 yrs  Vaping Use   Vaping Use: Never used  Substance and Sexual Activity  Alcohol use: Yes    Alcohol/week: 7.0 standard drinks    Types: 7 Shots of liquor per week    Comment: "2 vodka tonics a night"   Drug use: Yes    Comment: occasionally - CBD oil   Sexual activity: Not on file  Other Topics Concern   Not on file  Social History Narrative   Lives in [place] alone   Works part time for a [employer] in [place] Strain: Low Risk    Difficulty of Paying Living Expenses: Not hard at all  Food Insecurity: No Food Insecurity   Worried About Charity fundraiser in the Last Year: Never true   Arboriculturist in the Last Year: Never true  Transportation Needs: No Transportation Needs   Lack of Transportation (Medical): No   Lack of Transportation (Non-Medical): No  Physical Activity: Sufficiently Active   Days of Exercise per Week: 5 days   Minutes of Exercise per Session: 40 min  Stress: No Stress Concern Present   Feeling of Stress : Not at all  Social Connections: Socially Isolated   Frequency of Communication with Friends and Family: More than three times a week   Frequency of Social Gatherings with Friends  and Family: More than three times a week   Attends Religious Services: Never   Marine scientist or Organizations: No   Attends Music therapist: Never   Marital Status: Never married  Human resources officer Violence: Not At Risk   Fear of Current or Ex-Partner: No   Emotionally Abused: No   Physically Abused: No   Sexually Abused: No    Family History: Family History  Problem Relation Age of Onset   Depression Father    Arthritis Sister        RA   Heart failure Mother    Other Neg Hx    Colon cancer Neg Hx    Esophageal cancer Neg Hx    Rectal cancer Neg Hx    Stomach cancer Neg Hx    Colon polyps Neg Hx      Review of Systems: All other systems reviewed and are otherwise negative except as noted above.  Physical Exam: Vitals:   12/25/20 1131  BP: 110/66  Pulse: 93  SpO2: 96%  Weight: 173 lb 9.6 oz (78.7 kg)  Height: 6' 1"  (1.854 m)      General:  Well appearing. No resp difficulty. HEENT: Normal Neck: Supple. JVP 5-6. Carotids 2+ bilat; no bruits. No thyromegaly or nodule noted. Cor: PMI nondisplaced. RRR, No M/G/R noted Lungs: CTAB, normal effort. Abdomen: Soft, non-tender, non-distended, no HSM. No bruits or masses. +BS   Extremities: No cyanosis, clubbing, or rash. R and LLE no edema.  Neuro: Alert & orientedx3, cranial nerves grossly intact. moves all 4 extremities w/o difficulty. Affect pleasant   Barostim Interrogation- Performed personally and reviewed in detail today,  See scanned report  EKG:  EKG is not ordered today.  Recent Labs: 03/23/2020: NT-Pro BNP 520 11/30/2020: ALT 70; B Natriuretic Peptide 2,653.1 12/06/2020: Magnesium 1.9 12/18/2020: BUN 9; Creatinine, Ser 0.73; Hemoglobin 10.8; Platelets 211; Potassium 3.5; Sodium 135   Wt Readings from Last 3 Encounters:  12/25/20 173 lb 9.6 oz (78.7 kg)  12/22/20 173 lb (78.5 kg)  12/18/20 177 lb 12.8 oz (80.6 kg)     Other studies Reviewed: Additional studies/ records that were  reviewed today include: Previous EP  and HF notes.  Assessment and Plan:  1. Chronic systolic CHF s/p St. Jude and Barostim implantation NYHA II-III symptoms.  Barostim device set to 7.0 ma for final programming. Device impedence stable. Now s/p ICD extraction 2/2 strep bacteremia.  See scanned report.  2. Ischemic CMP Denies s/s of ischemia LHC 11/29/20 with stable 2vCAD  3. Mod/Sev AS Per most recent Echo.  Per HF notes he will need repeat in 6 months and likely eventual TAVR consideration. Deferred further due to recent acute illness.  4. HTN Continue current medications  Current medicines are reviewed at length with the patient today.   The patient does not have concerns regarding his medicines.  The following changes were made today:  none  Labs/ tests ordered today include:  No orders of the defined types were placed in this encounter.   Disposition:  Discussed with Dr. Rayann Heman who also discussed with the patient today. Continue Lifevest for now. Will need to at least finish IV ABX and maintain clean BCx prior to considering re-implantation. In the event he moves forward with LVAD, we would defer until after it's placement. Of note, pt has not had ICD therapies or VT previously. Ultimately, could consider S-ICD, as he does not have an option of R sided placement given his Barostim device.    Jacalyn Lefevre, PA-C  12/25/2020 11:51 AM  [facility] [address] [place] [address] [phone] (office) [phone] (fax)

## 2020-12-25 ENCOUNTER — Ambulatory Visit (INDEPENDENT_AMBULATORY_CARE_PROVIDER_SITE_OTHER): Payer: Medicare Other | Admitting: Student

## 2020-12-25 ENCOUNTER — Encounter: Payer: Self-pay | Admitting: Student

## 2020-12-25 ENCOUNTER — Encounter: Payer: Self-pay | Admitting: Infectious Disease

## 2020-12-25 ENCOUNTER — Other Ambulatory Visit: Payer: Self-pay

## 2020-12-25 VITALS — BP 110/66 | HR 93 | Ht 73.0 in | Wt 173.6 lb

## 2020-12-25 DIAGNOSIS — T827XXD Infection and inflammatory reaction due to other cardiac and vascular devices, implants and grafts, subsequent encounter: Secondary | ICD-10-CM | POA: Diagnosis not present

## 2020-12-25 DIAGNOSIS — I5022 Chronic systolic (congestive) heart failure: Secondary | ICD-10-CM | POA: Diagnosis not present

## 2020-12-25 DIAGNOSIS — I255 Ischemic cardiomyopathy: Secondary | ICD-10-CM | POA: Diagnosis not present

## 2020-12-25 DIAGNOSIS — I10 Essential (primary) hypertension: Secondary | ICD-10-CM

## 2020-12-25 DIAGNOSIS — I2510 Atherosclerotic heart disease of native coronary artery without angina pectoris: Secondary | ICD-10-CM

## 2020-12-25 NOTE — Patient Instructions (Addendum)
Medication Instructions:  Your physician recommends that you continue on your current medications as directed. Please refer to the Current Medication list given to you today.  *If you need a refill on your cardiac medications before your next appointment, please call your pharmacy*   Lab Work: None If you have labs (blood work) drawn today and your tests are completely normal, you will receive your results only by: MyChart Message (if you have MyChart) OR A paper copy in the mail If you have any lab test that is abnormal or we need to change your treatment, we will call you to review the results.   Follow-Up: At [facility], you and your health needs are our priority.  As part of our continuing mission to provide you with exceptional heart care, we have created designated Provider Care Teams.  These Care Teams include your primary Cardiologist (physician) and Advanced Practice Providers (APPs -  Physician Assistants and Nurse Practitioners) who all work together to provide you with the care you need, when you need it.   Your next appointment:   As scheduled 

## 2020-12-27 ENCOUNTER — Ambulatory Visit (INDEPENDENT_AMBULATORY_CARE_PROVIDER_SITE_OTHER): Payer: Medicare Other | Admitting: Infectious Disease

## 2020-12-27 ENCOUNTER — Other Ambulatory Visit: Payer: Self-pay

## 2020-12-27 ENCOUNTER — Telehealth: Payer: Self-pay

## 2020-12-27 VITALS — BP 138/81 | HR 99

## 2020-12-27 DIAGNOSIS — B955 Unspecified streptococcus as the cause of diseases classified elsewhere: Secondary | ICD-10-CM

## 2020-12-27 DIAGNOSIS — T80219D Unspecified infection due to central venous catheter, subsequent encounter: Secondary | ICD-10-CM

## 2020-12-27 DIAGNOSIS — T827XXD Infection and inflammatory reaction due to other cardiac and vascular devices, implants and grafts, subsequent encounter: Secondary | ICD-10-CM

## 2020-12-27 DIAGNOSIS — K089 Disorder of teeth and supporting structures, unspecified: Secondary | ICD-10-CM | POA: Diagnosis not present

## 2020-12-27 DIAGNOSIS — R7881 Bacteremia: Secondary | ICD-10-CM | POA: Diagnosis present

## 2020-12-27 DIAGNOSIS — I255 Ischemic cardiomyopathy: Secondary | ICD-10-CM | POA: Diagnosis not present

## 2020-12-27 DIAGNOSIS — I330 Acute and subacute infective endocarditis: Secondary | ICD-10-CM | POA: Diagnosis not present

## 2020-12-27 NOTE — Progress Notes (Signed)
Subjective:  Chief complaint follow-up for Streptococcus gordonae bacteremia in the context of ICD that was removed    Patient ID: Brandon [name], male    DOB: 09-Sep-1955, 65 y.o.   MRN: [id number]  HPI  Brandon [name] is a 65 year old Caucasian man with advanced heart failure who developed streptococcus gordonae I bacteremia with septic and cardiogenic shock.  He underwent a transesophageal echocardiogram that showed what could be a fibroelastic Thoma versus a vegetation.  He was initially critically ill in the ICU on multiple pressors and intubated.  Ultimately he stabilized blood cultures cleared and his central lines were removed.  He had a CT of his chest abdomen pelvis which was clear in terms of showing any source of infection.  He was not able to have a Panorex to look for odontogenic source of infection.  His ICD was extracted and he has been placed on a course of ceftriaxone which is finishing out on September 5.  He is a patient of which not only reimplantation of an ICD is being considered but also LVAD is being considered.  Both of these however would require that we prove that he is sterilized his blood in the interim.  Seems to be tolerating the ceftriaxone without difficulty.  His stop date is September 5.  He again wonders how he could have acquired this infection I suggested it could be an oral source and also recommend that he see his dentist again though he says he has been seeing his dentist regularly every 6 months still would be think it be quite prudent especially in light of the cardiac devices that are being considered for implantation.       Past Medical History:  Diagnosis Date   AICD (automatic cardioverter/defibrillator) present 08/31/2019   Ankylosing spondylitis ([place])    Arthritis    BENIGN PROSTATIC HYPERTROPHY 06/07/2008   CHF (congestive heart failure) ([place])    COLONIC POLYPS, HX OF 06/07/2008   Depression    H/O hiatal hernia    Heart failure ([place])     HYPERLIPIDEMIA 06/07/2008   HYPERTENSION 06/07/2008   Myocardial infarction [facility]) 2005   NSTEMI, s/p LAD stent   NEPHROLITHIASIS, HX OF 06/07/2008   STEMI (ST elevation myocardial infarction) ([place]) 04/27/2019    Past Surgical History:  Procedure Laterality Date   COLONOSCOPY WITH PROPOFOL N/A 04/24/2020   Procedure: COLONOSCOPY WITH PROPOFOL;  Surgeon: [name], MD;  Location: [facility];  Service: Gastroenterology;  Laterality: N/A;   CORONARY ANGIOPLASTY WITH STENT PLACEMENT  2005   LAD stent, jailed diagonal   CORONARY/GRAFT ACUTE MI REVASCULARIZATION N/A 04/27/2019   Procedure: Coronary/Graft Acute MI Revascularization;  Surgeon: [name], MD;  Location: [place] [facility];  Service: Cardiovascular;  Laterality: N/A;   ICD IMPLANT  08/31/2019   ICD IMPLANT N/A 08/31/2019   Procedure: ICD IMPLANT;  Surgeon: [name], MD;  Location: [place] [facility];  Service: Cardiovascular;  Laterality: N/A;   ICD LEAD REMOVAL N/A 12/04/2020   Procedure: ICD SYSTEM EXTRACTION;  Surgeon: Vickie Epley, MD;  Location: [place];  Service: Cardiovascular;  Laterality: N/A;   LAMINECTOMY     lumbar   LEFT HEART CATH AND CORONARY ANGIOGRAPHY N/A 04/27/2019   Procedure: LEFT HEART CATH AND CORONARY ANGIOGRAPHY;  Surgeon: [name], MD;  Location: [place] [facility];  Service: Cardiovascular;  Laterality: N/A;   LUMBAR FUSION  01/2012   POLYPECTOMY  04/24/2020   Procedure: POLYPECTOMY;  Surgeon: Lebanon Cellar  P, MD;  Location: [facility];  Service: Gastroenterology;;   RIGHT HEART CATH N/A 04/27/2019   Procedure: RIGHT HEART CATH;  Surgeon: [name], Klever M, MD;  Location: [place] [facility];  Service: Cardiovascular;  Laterality: N/A;   RIGHT/LEFT HEART CATH AND CORONARY ANGIOGRAPHY N/A 11/29/2020   Procedure: RIGHT/LEFT HEART CATH AND CORONARY ANGIOGRAPHY;  Surgeon: Jolaine Artist, MD;  Location: [place] [facility];  Service: Cardiovascular;  Laterality:  N/A;   TEE WITHOUT CARDIOVERSION N/A 12/04/2020   Procedure: TRANSESOPHAGEAL ECHOCARDIOGRAM (TEE);  Surgeon: Vickie Epley, MD;  Location: [facility];  Service: Cardiovascular;  Laterality: N/A;   TONSILLECTOMY     warthin tumor removal Left 2014    Family History  Problem Relation Age of Onset   Depression Father    Arthritis Sister        RA   Heart failure Mother    Other Neg Hx    Colon cancer Neg Hx    Esophageal cancer Neg Hx    Rectal cancer Neg Hx    Stomach cancer Neg Hx    Colon polyps Neg Hx       Social History   Socioeconomic History   Marital status: Single    Spouse name: Not on file   Number of children: Not on file   Years of education: Not on file   Highest education level: Not on file  Occupational History   Occupation: Best boy: [employer]  Tobacco Use   Smoking status: Former    Packs/day: 1.00    Years: 40.00    Pack years: 40.00    Types: Cigarettes    Quit date: 07/26/2019    Years since quitting: 1.4   Smokeless tobacco: Never   Tobacco comments:    1-1.5 pks per year x 40-45 yrs  Vaping Use   Vaping Use: Never used  Substance and Sexual Activity   Alcohol use: Yes    Alcohol/week: 7.0 standard drinks    Types: 7 Shots of liquor per week    Comment: "2 vodka tonics a night"   Drug use: Yes    Comment: occasionally - CBD oil   Sexual activity: Not on file  Other Topics Concern   Not on file  Social History Narrative   Lives in [place] alone   Works part time for a [employer] in [place] Strain: Low Risk    Difficulty of Paying Living Expenses: Not hard at all  Food Insecurity: No Food Insecurity   Worried About Charity fundraiser in the Last Year: Never true   Arboriculturist in the Last Year: Never true  Transportation Needs: No Transportation Needs   Lack of Transportation (Medical): No   Lack of Transportation (Non-Medical): No   Physical Activity: Sufficiently Active   Days of Exercise per Week: 5 days   Minutes of Exercise per Session: 40 min  Stress: No Stress Concern Present   Feeling of Stress : Not at all  Social Connections: Socially Isolated   Frequency of Communication with Friends and Family: More than three times a week   Frequency of Social Gatherings with Friends and Family: More than three times a week   Attends Religious Services: Never   Marine scientist or Organizations: No   Attends Archivist Meetings: Never   Marital Status: Never married    No Known Allergies  Current Outpatient Medications:    apixaban (ELIQUIS) 5 MG TABS tablet, Take 1 tablet (5 mg total) by mouth 2 (two) times daily., Disp: 60 tablet, Rfl: 6   cefTRIAXone (ROCEPHIN) IVPB, Inject 2 g into the vein daily. Indication:  bacteremia  First Dose: No Last Day of Therapy:  01/15/2021 Labs - Once weekly:  CBC/D and BMP, Labs - Every other week:  ESR and CRP Method of administration: IV Push Method of administration may be changed at the discretion of home infusion pharmacist based upon assessment of the patient and/or caregiver's ability to self-administer the medication ordered., Disp: 41 Units, Rfl: 0   acetaminophen (TYLENOL) 500 MG tablet, Take 1,000 mg by mouth every 6 (six) hours as needed (pain.)., Disp: , Rfl:    atorvastatin (LIPITOR) 80 MG tablet, TAKE 1 TABLET BY MOUTH ONCE DAILY AT 6PM., Disp: 90 tablet, Rfl: 3   Carboxymethylcellulose Sodium (ARTIFICIAL TEARS OP), Place 1 drop into both eyes daily as needed (dry/irritated eyes.)., Disp: , Rfl:    dapagliflozin propanediol (FARXIGA) 10 MG TABS tablet, Take 1 tablet (10 mg total) by mouth in the morning., Disp: 30 tablet, Rfl: 6   digoxin (LANOXIN) 0.125 MG tablet, Take 1 tablet (0.125 mg total) by mouth daily., Disp: 30 tablet, Rfl: 6   diphenhydrAMINE (BENADRYL) 25 MG tablet, Take 50 mg by mouth at bedtime., Disp: , Rfl:    eplerenone (INSPRA) 50 MG  tablet, Take 0.5 tablets (25 mg total) by mouth in the morning., Disp: 30 tablet, Rfl: 6   ezetimibe (ZETIA) 10 MG tablet, TAKE ONE TABLET BY MOUTH DAILY, Disp: 90 tablet, Rfl: 3   furosemide (LASIX) 40 MG tablet, Take 1 tablet (40 mg total) by mouth every other day., Disp: 15 tablet, Rfl: 3   gabapentin (NEURONTIN) 100 MG capsule, Take 1 capsule (100 mg total) by mouth 3 (three) times daily., Disp: 90 capsule, Rfl: 0   inFLIXimab in sodium chloride 0.9 %, Inject into the vein every 6 (six) weeks., Disp: , Rfl:    ivabradine (CORLANOR) 7.5 MG TABS tablet, Take 1 tablet (7.5 mg total) by mouth 2 (two) times daily with a meal., Disp: 60 tablet, Rfl: 6   melatonin 5 MG TABS, Take 1 tablet (5 mg total) by mouth at bedtime., Disp: 30 tablet, Rfl: 6   pantoprazole (PROTONIX) 40 MG tablet, Take 1 tablet (40 mg total) by mouth daily., Disp: 30 tablet, Rfl: 6   sertraline (ZOLOFT) 25 MG tablet, Take 1 tablet (25 mg total) by mouth daily for 21 days, THEN 2 tablets (50 mg total) daily., Disp: 201 tablet, Rfl: 1   tamsulosin (FLOMAX) 0.4 MG CAPS capsule, TAKE 1 CAPSULE(0.4 MG) BY MOUTH DAILY, Disp: 90 capsule, Rfl: 1   Review of Systems  Constitutional:  Negative for chills and fever.  HENT:  Negative for congestion and sore throat.   Eyes:  Negative for photophobia.  Respiratory:  Negative for cough, shortness of breath and wheezing.   Cardiovascular:  Negative for chest pain, palpitations and leg swelling.  Gastrointestinal:  Negative for abdominal pain, blood in stool, constipation, diarrhea, nausea and vomiting.  Genitourinary:  Negative for dysuria, flank pain and hematuria.  Musculoskeletal:  Negative for back pain and myalgias.  Skin:  Negative for rash.  Neurological:  Negative for dizziness, weakness and headaches.  Hematological:  Does not bruise/bleed easily.  Psychiatric/Behavioral:  Negative for agitation, behavioral problems, confusion, decreased concentration, dysphoric mood and suicidal  ideas.       Objective:  Physical Exam Constitutional:      General: He is not in acute distress.    Appearance: Normal appearance. He is well-developed. He is not ill-appearing or diaphoretic.  HENT:     Head: Normocephalic and atraumatic.     Right Ear: Hearing and external ear normal.     Left Ear: Hearing and external ear normal.     Nose: No nasal deformity or rhinorrhea.     Mouth/Throat:     Mouth: Mucous membranes are moist.     Dentition: Abnormal dentition. Dental caries present.  Eyes:     General: No scleral icterus.    Conjunctiva/sclera: Conjunctivae normal.     Right eye: Right conjunctiva is not injected.     Left eye: Left conjunctiva is not injected.     Pupils: Pupils are equal, round, and reactive to light.  Neck:     Vascular: No JVD.  Cardiovascular:     Rate and Rhythm: Normal rate and regular rhythm.     Heart sounds: S1 normal and S2 normal. Murmur heard.  Systolic murmur is present with a grade of 3/6.    No friction rub.     Comments: Radiates to carotids Abdominal:     General: Bowel sounds are normal. There is no distension.     Palpations: Abdomen is soft.     Tenderness: There is no abdominal tenderness.  Musculoskeletal:        General: Normal range of motion.     Right shoulder: Normal.     Left shoulder: Normal.     Cervical back: Normal range of motion and neck supple.     Right hip: Normal.     Left hip: Normal.     Right knee: Normal.     Left knee: Normal.  Lymphadenopathy:     Head:     Right side of head: No submandibular, preauricular or posterior auricular adenopathy.     Left side of head: No submandibular, preauricular or posterior auricular adenopathy.     Cervical: No cervical adenopathy.     Right cervical: No superficial or deep cervical adenopathy.    Left cervical: No superficial or deep cervical adenopathy.  Skin:    General: Skin is warm and dry.     Coloration: Skin is not pale.     Findings: No abrasion,  bruising, ecchymosis, erythema, lesion or rash.     Nails: There is no clubbing.  Neurological:     Mental Status: He is alert and oriented to person, place, and time.     Sensory: No sensory deficit.     Coordination: Coordination normal.     Gait: Gait normal.  Psychiatric:        Attention and Perception: He is attentive.        Speech: Speech normal.        Behavior: Behavior normal. Behavior is cooperative.        Thought Content: Thought content normal.        Judgment: Judgment normal.   PICC clean           Assessment & Plan:   Streptococcus gordonae I with possible endocarditis concern for ICD infection status post removal of ICD:  He is to finish his ceftriaxone on September 5.  Will check surveillance blood cultures 2 weeks thereafter.  Recommend that he see his dentist again for plain films of teeth to make sure there is no dental abscess as a source.  I spent 32  minutes with the patient including face to face counseling of the patient regarding the nature of endovascular infections device associated infection and their management personally reviewing his medical records before and during the visit and in coordination of his care.

## 2020-12-27 NOTE — Telephone Encounter (Signed)
Confirmed end date with [facility] at [facility]; verbal orders given to pull PICC after last dose on 9/5. Orders repeated back correctly. Patient aware.  Jahmeek Shirk Lorita Officer, RN

## 2021-01-01 ENCOUNTER — Encounter: Payer: Self-pay | Admitting: Infectious Disease

## 2021-01-08 ENCOUNTER — Emergency Department (HOSPITAL_COMMUNITY): Payer: Medicare Other

## 2021-01-08 ENCOUNTER — Observation Stay (HOSPITAL_COMMUNITY)
Admission: EM | Admit: 2021-01-08 | Discharge: 2021-01-09 | Disposition: A | Discharge status: Home / Self Care | Payer: Medicare Other | Attending: Internal Medicine | Admitting: Internal Medicine

## 2021-01-08 ENCOUNTER — Other Ambulatory Visit: Payer: Self-pay

## 2021-01-08 ENCOUNTER — Encounter (HOSPITAL_COMMUNITY): Payer: Self-pay | Admitting: Student

## 2021-01-08 DIAGNOSIS — Z79899 Other long term (current) drug therapy: Secondary | ICD-10-CM | POA: Insufficient documentation

## 2021-01-08 DIAGNOSIS — I2510 Atherosclerotic heart disease of native coronary artery without angina pectoris: Secondary | ICD-10-CM | POA: Insufficient documentation

## 2021-01-08 DIAGNOSIS — I5033 Acute on chronic diastolic (congestive) heart failure: Secondary | ICD-10-CM | POA: Diagnosis not present

## 2021-01-08 DIAGNOSIS — R0602 Shortness of breath: Secondary | ICD-10-CM

## 2021-01-08 DIAGNOSIS — I110 Hypertensive heart disease with heart failure: Secondary | ICD-10-CM | POA: Diagnosis not present

## 2021-01-08 DIAGNOSIS — I5023 Acute on chronic systolic (congestive) heart failure: Secondary | ICD-10-CM | POA: Diagnosis present

## 2021-01-08 DIAGNOSIS — Z20822 Contact with and (suspected) exposure to covid-19: Secondary | ICD-10-CM | POA: Insufficient documentation

## 2021-01-08 DIAGNOSIS — Z87891 Personal history of nicotine dependence: Secondary | ICD-10-CM | POA: Insufficient documentation

## 2021-01-08 DIAGNOSIS — Z7901 Long term (current) use of anticoagulants: Secondary | ICD-10-CM | POA: Insufficient documentation

## 2021-01-08 DIAGNOSIS — I509 Heart failure, unspecified: Secondary | ICD-10-CM

## 2021-01-08 LAB — CBC WITH DIFFERENTIAL/PLATELET
Abs Immature Granulocytes: 0.04 10*3/uL (ref 0.00–0.07)
Basophils Absolute: 0 10*3/uL (ref 0.0–0.1)
Basophils Relative: 0 %
Eosinophils Absolute: 0.1 10*3/uL (ref 0.0–0.5)
Eosinophils Relative: 1 %
HCT: 34.4 % — ABNORMAL LOW (ref 39.0–52.0)
Hemoglobin: 10.5 g/dL — ABNORMAL LOW (ref 13.0–17.0)
Immature Granulocytes: 1 %
Lymphocytes Relative: 12 %
Lymphs Abs: 0.9 10*3/uL (ref 0.7–4.0)
MCH: 26.5 pg (ref 26.0–34.0)
MCHC: 30.5 g/dL (ref 30.0–36.0)
MCV: 86.9 fL (ref 80.0–100.0)
Monocytes Absolute: 0.7 10*3/uL (ref 0.1–1.0)
Monocytes Relative: 9 %
Neutro Abs: 5.7 10*3/uL (ref 1.7–7.7)
Neutrophils Relative %: 77 %
Platelets: 286 10*3/uL (ref 150–400)
RBC: 3.96 MIL/uL — ABNORMAL LOW (ref 4.22–5.81)
RDW: 17 % — ABNORMAL HIGH (ref 11.5–15.5)
WBC: 7.4 10*3/uL (ref 4.0–10.5)
nRBC: 0 % (ref 0.0–0.2)

## 2021-01-08 LAB — COMPREHENSIVE METABOLIC PANEL
ALT: 39 U/L (ref 0–44)
AST: 34 U/L (ref 15–41)
Albumin: 2.8 g/dL — ABNORMAL LOW (ref 3.5–5.0)
Alkaline Phosphatase: 117 U/L (ref 38–126)
Anion gap: 6 (ref 5–15)
BUN: 9 mg/dL (ref 8–23)
CO2: 25 mmol/L (ref 22–32)
Calcium: 8.9 mg/dL (ref 8.9–10.3)
Chloride: 102 mmol/L (ref 98–111)
Creatinine, Ser: 0.61 mg/dL (ref 0.61–1.24)
GFR, Estimated: >60 mL/min (ref >60)
Glucose, Bld: 111 mg/dL — ABNORMAL HIGH (ref 70–99)
Potassium: 3.7 mmol/L (ref 3.5–5.1)
Sodium: 133 mmol/L — ABNORMAL LOW (ref 135–145)
Total Bilirubin: 1 mg/dL (ref 0.3–1.2)
Total Protein: 6.7 g/dL (ref 6.5–8.1)

## 2021-01-08 LAB — TROPONIN I (HIGH SENSITIVITY)
Troponin I (High Sensitivity): 86 ng/L — ABNORMAL HIGH (ref <18)
Troponin I (High Sensitivity): 88 ng/L — ABNORMAL HIGH (ref <18)

## 2021-01-08 LAB — BRAIN NATRIURETIC PEPTIDE: B Natriuretic Peptide: 734.2 pg/mL — ABNORMAL HIGH (ref 0.0–100.0)

## 2021-01-08 LAB — DIGOXIN LEVEL: Digoxin Level: 0.5 ng/mL — ABNORMAL LOW (ref 0.8–2.0)

## 2021-01-08 LAB — RESP PANEL BY RT-PCR (FLU A&B, COVID) ARPGX2
Influenza A by PCR: NEGATIVE
Influenza B by PCR: NEGATIVE
SARS Coronavirus 2 by RT PCR: NEGATIVE

## 2021-01-08 LAB — LACTIC ACID, PLASMA
Lactic Acid, Venous: 0.9 mmol/L (ref 0.5–1.9)
Lactic Acid, Venous: 1.2 mmol/L (ref 0.5–1.9)

## 2021-01-08 MED ORDER — ATORVASTATIN CALCIUM 40 MG PO TABS
80.0000 mg | ORAL_TABLET | Freq: Every day | ORAL | Status: DC
  Administered 2021-01-08: 80 mg via ORAL
  Filled 2021-01-08: qty 2

## 2021-01-08 MED ORDER — MELATONIN 5 MG PO TABS
5.0000 mg | ORAL_TABLET | Freq: Every day | ORAL | Status: DC
  Administered 2021-01-08: 5 mg via ORAL
  Filled 2021-01-08: qty 1

## 2021-01-08 MED ORDER — SODIUM CHLORIDE 0.9 % IV SOLN
2.0000 g | Freq: Every day | INTRAVENOUS | Status: DC
  Administered 2021-01-08: 2 g via INTRAVENOUS
  Filled 2021-01-08: qty 20

## 2021-01-08 MED ORDER — EZETIMIBE 10 MG PO TABS
10.0000 mg | ORAL_TABLET | Freq: Every day | ORAL | Status: DC
  Filled 2021-01-08: qty 1

## 2021-01-08 MED ORDER — TAMSULOSIN HCL 0.4 MG PO CAPS
0.4000 mg | ORAL_CAPSULE | Freq: Every day | ORAL | Status: DC
  Administered 2021-01-08: 0.4 mg via ORAL
  Filled 2021-01-08: qty 1

## 2021-01-08 MED ORDER — PANTOPRAZOLE SODIUM 40 MG PO TBEC
40.0000 mg | DELAYED_RELEASE_TABLET | Freq: Every day | ORAL | Status: DC
  Administered 2021-01-08: 40 mg via ORAL
  Filled 2021-01-08: qty 1

## 2021-01-08 MED ORDER — ACETAMINOPHEN 500 MG PO TABS: 1000.0000 mg | ORAL_TABLET | Freq: Four times a day (QID) | ORAL | Status: DC | PRN

## 2021-01-08 MED ORDER — SPIRONOLACTONE 25 MG PO TABS
25.0000 mg | ORAL_TABLET | Freq: Every day | ORAL | Status: DC
  Filled 2021-01-08 (×2): qty 1

## 2021-01-08 MED ORDER — ONDANSETRON HCL 4 MG/2ML IJ SOLN: 4.0000 mg | Freq: Four times a day (QID) | INTRAMUSCULAR | Status: DC | PRN

## 2021-01-08 MED ORDER — FUROSEMIDE 10 MG/ML IJ SOLN
40.0000 mg | Freq: Two times a day (BID) | INTRAMUSCULAR | Status: DC
  Administered 2021-01-08: 40 mg via INTRAVENOUS
  Filled 2021-01-08: qty 4

## 2021-01-08 MED ORDER — SODIUM CHLORIDE 0.9% FLUSH: 3.0000 mL | INTRAVENOUS | Status: DC | PRN

## 2021-01-08 MED ORDER — IVABRADINE HCL 7.5 MG PO TABS
7.5000 mg | ORAL_TABLET | Freq: Two times a day (BID) | ORAL | Status: DC
  Administered 2021-01-08: 7.5 mg via ORAL
  Filled 2021-01-08 (×2): qty 1

## 2021-01-08 MED ORDER — FUROSEMIDE 10 MG/ML IJ SOLN
40.0000 mg | Freq: Once | INTRAMUSCULAR | Status: AC
  Administered 2021-01-08: 40 mg via INTRAVENOUS
  Filled 2021-01-08: qty 4

## 2021-01-08 MED ORDER — APIXABAN 5 MG PO TABS
5.0000 mg | ORAL_TABLET | Freq: Two times a day (BID) | ORAL | Status: DC
  Administered 2021-01-08: 5 mg via ORAL
  Filled 2021-01-08: qty 1

## 2021-01-08 MED ORDER — SODIUM CHLORIDE 0.9 % IV SOLN: 250.0000 mL | INTRAVENOUS | Status: DC | PRN

## 2021-01-08 MED ORDER — DIGOXIN 125 MCG PO TABS
0.1250 mg | ORAL_TABLET | Freq: Every day | ORAL | Status: DC
  Administered 2021-01-08: 0.125 mg via ORAL
  Filled 2021-01-08: qty 1

## 2021-01-08 MED ORDER — SODIUM CHLORIDE 0.9% FLUSH
3.0000 mL | Freq: Two times a day (BID) | INTRAVENOUS | Status: DC
  Administered 2021-01-08: 3 mL via INTRAVENOUS

## 2021-01-08 MED ORDER — CEFTRIAXONE IV (FOR PTA / DISCHARGE USE ONLY): 2.0000 g | INTRAVENOUS | Status: DC

## 2021-01-08 MED ORDER — POTASSIUM CHLORIDE CRYS ER 20 MEQ PO TBCR
40.0000 meq | EXTENDED_RELEASE_TABLET | Freq: Once | ORAL | Status: AC
  Administered 2021-01-08: 40 meq via ORAL
  Filled 2021-01-08: qty 2

## 2021-01-08 MED ORDER — DIPHENHYDRAMINE HCL 25 MG PO CAPS
50.0000 mg | ORAL_CAPSULE | Freq: Every day | ORAL | Status: DC
  Administered 2021-01-08: 50 mg via ORAL
  Filled 2021-01-08: qty 2

## 2021-01-08 MED ORDER — DAPAGLIFLOZIN PROPANEDIOL 10 MG PO TABS
10.0000 mg | ORAL_TABLET | Freq: Every morning | ORAL | Status: DC
  Administered 2021-01-09: 10 mg via ORAL
  Filled 2021-01-08: qty 1

## 2021-01-08 MED ORDER — ACETAMINOPHEN 325 MG PO TABS
650.0000 mg | ORAL_TABLET | ORAL | Status: DC | PRN
  Administered 2021-01-08 – 2021-01-09 (×3): 650 mg via ORAL
  Filled 2021-01-08 (×3): qty 2

## 2021-01-08 NOTE — ED Triage Notes (Signed)
Pt brought in via [facility] with c/c of shOB. Pt is from home with shob that has been going on since 1030pm. Hx of CHF with chronic O2 2L PRN. Pt arrived restless. Recently seen for sepsis and d/c with antibiotics.   96-97%RA, 137/98, 97HR

## 2021-01-08 NOTE — ED Provider Notes (Signed)
[facility] EMERGENCY DEPARTMENT Provider Note   CSN: [id number] Arrival date & time: 01/08/21  1655     History Chief Complaint  Patient presents with   Shortness of Breath    Brandon [name] is a 65 y.o. male with a hx of CAD, CHF w/ last EF 20%, prior AICD S/p removal, hypertension, prior DVT On Eliquis, & hyperlipidemia who presents to the ED via EMS with complaints of dyspnea x 1 week, worsening over past few days. Patient reports dyspnea is fairly constant, much worse when attempting to lay down supine or on his side and worse with activity, alleviated some sitting upright. Wearing his PRN 2L via [place] fairly constantly at this point. He has not been able to sleep over the past few days due to his breathing difficulty. He has had some ankle swelling and very mild cough. Per EMS sats in the 90s on RA however patient restless and had increased work of breathing- placed on 5L via [place]. Patient denies fever, chest pain, N/V, diaphoresis, syncope, or abdominal pain.   Chart review for additional hx.  Patient with recent hospital admission 12/01/20-12/11/20 for strep bacteremia with septic shock. Presented 07/22 for scheduled RHC/LHC- cath showed low filling pressures, stable CAD, and EF 20% with moderately reduced cardiac output. He returned home and shortly following cath developed chills with N/V & dyspnea- noted to be hypoxic & hypotensive, required intubation & pressors with broad spectrum abx, blood cx showed strep gordonii, ICD extraction 12/04/20. IV rocephin through 01/15/21. Discharged with PICC & Life vest.   HPI     Past Medical History:  Diagnosis Date   AICD (automatic cardioverter/defibrillator) present 08/31/2019   Ankylosing spondylitis ([place])    Arthritis    BENIGN PROSTATIC HYPERTROPHY 06/07/2008   CHF (congestive heart failure) ([place])    COLONIC POLYPS, HX OF 06/07/2008   Depression    H/O hiatal hernia    Heart failure ([place])    HYPERLIPIDEMIA 06/07/2008    HYPERTENSION 06/07/2008   Myocardial infarction [facility]) 2005   NSTEMI, s/p LAD stent   NEPHROLITHIASIS, HX OF 06/07/2008   STEMI (ST elevation myocardial infarction) ([place]) 04/27/2019    Patient Active Problem List   Diagnosis Date Noted   ICD (implantable cardioverter-defibrillator) in place    Dental caries    Malnutrition of moderate degree 12/01/2020   Pressure injury of skin 12/01/2020   Cholelithiases    Aortic valve endocarditis    Pleural effusion    Aspiration into airway    Elevated brain natriuretic peptide (BNP) level 11/30/2020   Elevated troponin 11/30/2020   Hypokalemia 11/30/2020   Hypomagnesemia 11/30/2020   Normocytic anemia 11/30/2020   Dyspnea    Respiratory failure ([place])    History of ETT    Infected defibrillator ([place])    Streptococcal bacteremia    Central line infection    Acute respiratory failure with hypoxia and hypercapnia ([place]) 11/29/2020   Centrilobular emphysema ([place]) 08/09/2020   Benign neoplasm of colon    Heart failure ([place]) 12/31/2019   Ischemic cardiomyopathy 08/31/2019   Cardiogenic shock ([place])    STEMI (ST elevation myocardial infarction) ([place]) 04/27/2019   Acute anterior wall MI ([place]) 04/27/2019   Mild aortic stenosis 12/02/2012   Ankylosis of sacroiliac joint 03/08/2011   TOBACCO ABUSE 03/13/2009   Hyperlipidemia with target LDL less than 70 06/07/2008   Essential hypertension 06/07/2008   Coronary atherosclerosis 06/07/2008   BENIGN PROSTATIC HYPERTROPHY 06/07/2008   History of colon polyps 06/07/2008  NEPHROLITHIASIS, HX OF 06/07/2008    Past Surgical History:  Procedure Laterality Date   COLONOSCOPY WITH PROPOFOL N/A 04/24/2020   Procedure: COLONOSCOPY WITH PROPOFOL;  Surgeon: [name], MD;  Location: [facility];  Service: Gastroenterology;  Laterality: N/A;   CORONARY ANGIOPLASTY WITH STENT PLACEMENT  2005   LAD stent, jailed diagonal   CORONARY/GRAFT ACUTE MI REVASCULARIZATION N/A 04/27/2019   Procedure:  Coronary/Graft Acute MI Revascularization;  Surgeon: [name], MD;  Location: [facility];  Service: Cardiovascular;  Laterality: N/A;   ICD IMPLANT  08/31/2019   ICD IMPLANT N/A 08/31/2019   Procedure: ICD IMPLANT;  Surgeon: [name], MD;  Location: [facility];  Service: Cardiovascular;  Laterality: N/A;   ICD LEAD REMOVAL N/A 12/04/2020   Procedure: ICD SYSTEM EXTRACTION;  Surgeon: Vickie Epley, MD;  Location: [facility];  Service: Cardiovascular;  Laterality: N/A;   LAMINECTOMY     lumbar   LEFT HEART CATH AND CORONARY ANGIOGRAPHY N/A 04/27/2019   Procedure: LEFT HEART CATH AND CORONARY ANGIOGRAPHY;  Surgeon: [name], MD;  Location: [facility] CV LAB;  Service: Cardiovascular;  Laterality: N/A;   LUMBAR FUSION  01/2012   POLYPECTOMY  04/24/2020   Procedure: POLYPECTOMY;  Surgeon: [name], MD;  Location: [facility];  Service: Gastroenterology;;   RIGHT HEART CATH N/A 04/27/2019   Procedure: RIGHT HEART CATH;  Surgeon: [name], Izik M, MD;  Location: [facility] CV LAB;  Service: Cardiovascular;  Laterality: N/A;   RIGHT/LEFT HEART CATH AND CORONARY ANGIOGRAPHY N/A 11/29/2020   Procedure: RIGHT/LEFT HEART CATH AND CORONARY ANGIOGRAPHY;  Surgeon: Jolaine Artist, MD;  Location: [facility] LAB;  Service: Cardiovascular;  Laterality: N/A;   TEE WITHOUT CARDIOVERSION N/A 12/04/2020   Procedure: TRANSESOPHAGEAL ECHOCARDIOGRAM (TEE);  Surgeon: Vickie Epley, MD;  Location[facility];  Service: Cardiovascular;  Laterality: N/A;   TONSILLECTOMY     warthin tumor removal Left 2014       Family History  Problem Relation Age of Onset   Depression Father    Arthritis Sister        RA   Heart failure Mother    Other Neg Hx    Colon cancer Neg Hx    Esophageal cancer Neg Hx    Rectal cancer Neg Hx    Stomach cancer Neg Hx    Colon polyps Neg Hx     Social History   Tobacco Use   Smoking status: Former    Packs/day: 1.00     Years: 40.00    Pack years: 40.00    Types: Cigarettes    Quit date: 07/26/2019    Years since quitting: 1.4   Smokeless tobacco: Never   Tobacco comments:    1-1.5 pks per year x 40-45 yrs  Vaping Use   Vaping Use: Never used  Substance Use Topics   Alcohol use: Yes    Alcohol/week: 7.0 standard drinks    Types: 7 Shots of liquor per week    Comment: "2 vodka tonics a night"   Drug use: Yes    Comment: occasionally - CBD oil    Home Medications Prior to Admission medications   Medication Sig Start Date End Date Taking? Authorizing Provider  acetaminophen (TYLENOL) 500 MG tablet Take 1,000 mg by mouth every 6 (six) hours as needed (pain.).    [provider]  apixaban (ELIQUIS) 5 MG TABS tablet Take 1 tablet (5 mg total) by mouth 2 (  two) times daily. 12/11/20   Clegg, Amy D, NP  atorvastatin (LIPITOR) 80 MG tablet TAKE 1 TABLET BY MOUTH ONCE DAILY AT 6PM. 06/19/20   Hilty, Nadean Corwin, MD  Carboxymethylcellulose Sodium (ARTIFICIAL TEARS OP) Place 1 drop into both eyes daily as needed (dry/irritated eyes.).    [provider]  cefTRIAXone (ROCEPHIN) IVPB Inject 2 g into the vein daily. Indication:  bacteremia  First Dose: No Last Day of Therapy:  01/15/2021 Labs - Once weekly:  CBC/D and BMP, Labs - Every other week:  ESR and CRP Method of administration: IV Push Method of administration may be changed at the discretion of home infusion pharmacist based upon assessment of the patient and/or caregiver's ability to self-administer the medication ordered. 12/11/20 01/21/21  Darrick Grinder D, NP  dapagliflozin propanediol (FARXIGA) 10 MG TABS tablet Take 1 tablet (10 mg total) by mouth in the morning. 12/11/20   Clegg, Amy D, NP  digoxin (LANOXIN) 0.125 MG tablet Take 1 tablet (0.125 mg total) by mouth daily. 12/12/20   Clegg, Amy D, NP  diphenhydrAMINE (BENADRYL) 25 MG tablet Take 50 mg by mouth at bedtime.    [provider]  eplerenone (INSPRA) 50 MG tablet Take 0.5  tablets (25 mg total) by mouth in the morning. 12/11/20   Clegg, Amy D, NP  ezetimibe (ZETIA) 10 MG tablet TAKE ONE TABLET BY MOUTH DAILY 05/30/20   Lelon Perla, MD  furosemide (LASIX) 40 MG tablet Take 1 tablet (40 mg total) by mouth every other day. 12/18/20   [place], Maricela Bo, FNP  gabapentin (NEURONTIN) 100 MG capsule Take 1 capsule (100 mg total) by mouth 3 (three) times daily. 12/11/20   Clegg, Amy D, NP  inFLIXimab in sodium chloride 0.9 % Inject into the vein every 6 (six) weeks.    [provider]  ivabradine (CORLANOR) 7.5 MG TABS tablet Take 1 tablet (7.5 mg total) by mouth 2 (two) times daily with a meal. 12/11/20   Clegg, Amy D, NP  melatonin 5 MG TABS Take 1 tablet (5 mg total) by mouth at bedtime. 12/11/20   Clegg, Amy D, NP  pantoprazole (PROTONIX) 40 MG tablet Take 1 tablet (40 mg total) by mouth daily. 12/12/20   Clegg, Amy D, NP  sertraline (ZOLOFT) 25 MG tablet Take 1 tablet (25 mg total) by mouth daily for 21 days, THEN 2 tablets (50 mg total) daily. 12/18/20 04/08/21  Rafael Bihari, FNP  tamsulosin (FLOMAX) 0.4 MG CAPS capsule TAKE 1 CAPSULE(0.4 MG) BY MOUTH DAILY 10/03/20   [name], MD    Allergies    Patient has no known allergies.  Review of Systems   Review of Systems  Constitutional:  Negative for chills and fever.  Respiratory:  Positive for cough and shortness of breath.   Cardiovascular:  Positive for leg swelling (ankles). Negative for chest pain.  Gastrointestinal:  Negative for abdominal pain, nausea and vomiting.  Neurological:  Negative for syncope.  All other systems reviewed and are negative.  Physical Exam Updated Vital Signs BP 136/84 (BP Location: Right Arm)   Pulse (!) 101   Temp (!) 97 F (36.1 C) (Temporal)   Resp 19   SpO2 100%   Physical Exam Vitals and nursing note reviewed.  Constitutional:      General: He is not in acute distress.    Appearance: He is well-developed. He is not toxic-appearing.  HENT:     Head:  Normocephalic and atraumatic.  Eyes:  General:        Right eye: No discharge.        Left eye: No discharge.     Conjunctiva/sclera: Conjunctivae normal.  Cardiovascular:     Rate and Rhythm: Normal rate and regular rhythm.  Pulmonary:     Effort: No respiratory distress.     Breath sounds: Rales (@ the bases w/ decreased breath sounds) present. No wheezing or rhonchi.  Abdominal:     General: There is no distension.     Palpations: Abdomen is soft.     Tenderness: There is no abdominal tenderness.  Musculoskeletal:     Cervical back: Neck supple.     Comments: No lower leg edema. No calf tenderness.   Skin:    General: Skin is warm and dry.     Findings: No rash.  Neurological:     Mental Status: He is alert.     Comments: Clear speech.   Psychiatric:        Behavior: Behavior normal.    ED Results / Procedures / Treatments   Labs (all labs ordered are listed, but only abnormal results are displayed) Labs Reviewed  COMPREHENSIVE METABOLIC PANEL - Abnormal; Notable for the following components:      Result Value   Sodium 133 (*)    Glucose, Bld 111 (*)    Albumin 2.8 (*)    All other components within normal limits  CBC WITH DIFFERENTIAL/PLATELET - Abnormal; Notable for the following components:   RBC 3.96 (*)    Hemoglobin 10.5 (*)    HCT 34.4 (*)    RDW 17.0 (*)    All other components within normal limits  BRAIN NATRIURETIC PEPTIDE - Abnormal; Notable for the following components:   B Natriuretic Peptide 734.2 (*)    All other components within normal limits  TROPONIN I (HIGH SENSITIVITY) - Abnormal; Notable for the following components:   Troponin I (High Sensitivity) 86 (*)    All other components within normal limits  RESP PANEL BY RT-PCR (FLU A&B, COVID) ARPGX2  CULTURE, BLOOD (ROUTINE X 2)  CULTURE, BLOOD (ROUTINE X 2)  LACTIC ACID, PLASMA  LACTIC ACID, PLASMA  TROPONIN I (HIGH SENSITIVITY)    EKG EKG Interpretation  Date/Time:  Monday January 08 2021 07:17:39 EDT Ventricular Rate:  97 PR Interval:  168 QRS Duration: 173 QT Interval:  374 QTC Calculation: 476 R Axis:   48 Text Interpretation: Ectopic atrial rhythm Atrial premature complex Right atrial enlargement Left bundle branch block Artifact in lead(s) I II III aVR aVL aVF V1 V4 V5 V6 Confirmed by Dene Gentry [phone]) on 01/08/2021 7:27:56 AM  Radiology DG Chest Portable 1 View  Result Date: 01/08/2021 CLINICAL DATA:  Shortness of breath. EXAM: PORTABLE CHEST 1 VIEW COMPARISON:  12/04/2020 FINDINGS: 0725 hours. Diffuse interstitial and basilar airspace disease noted, progressive in the interval. Small bilateral pleural effusions associated. Cardiopericardial silhouette is at upper limits of normal for size. The visualized bony structures of the thorax show no acute abnormality. Left PICC line tip overlies the distal SVC. Telemetry leads overlie the chest. IMPRESSION: Interval progression of basilar predominant diffuse interstitial and alveolar lung disease. Pulmonary edema or diffuse infection could have this appearance. Small bilateral pleural effusions. Electronically Signed   By: Misty Stanley M.D.   On: 01/08/2021 07:45    Procedures Procedures   Medications Ordered in ED Medications - No data to display  ED Course  I have reviewed the triage vital signs and the  nursing notes.  Pertinent labs & imaging results that were available during my care of the patient were reviewed by me and considered in my medical decision making (see chart for details).    MDM Rules/Calculators/A&P                           Patient presents to the ED with complaints of shortness of breath x 1 week. Nontoxic, decreased breath sounds, not in overt respiratory distress at this time, currently on 2L via [place]. DDX: Acute CHF, pneumonia, atypical ACS, PE, pneumothorax, critical anemia.   Additional history obtained:  Additional history obtained from chart review & nursing note review. Recent  complex hospitalization.  EKG: No STEMI.  Lab Tests:  I Ordered, reviewed, and interpreted labs, which included:  CBC: Mild anemia similar to prior CMP: mild hypoalbuminemia.  Troponin: Mildly elevated- improved from prior BNP: Elevated.  Imaging Studies ordered:  I ordered imaging studies which included CXR, I independently reviewed, formal radiology impression shows: Interval progression of basilar predominant diffuse interstitial and alveolar lung disease. Pulmonary edema or diffuse infection could have this appearance. Small bilateral pleural effusions.   ED Course:  Lasix ordered. Case discussed with cardiology team who have evaluated patient & placed admission orders.   Findings and plan of care discussed with supervising physician Dr. Francia Greaves who is in agreement.   Portions of this note were generated with [employer]. Dictation errors may occur despite best attempts at proofreading.  Final Clinical Impression(s) / ED Diagnoses Final diagnoses:  SOB (shortness of breath)  Acute on chronic congestive heart failure, unspecified heart failure type [facility])    Rx / DC Orders ED Discharge Orders     None        Amaryllis Dyke, PA-C 01/08/21 [id number]    Valarie Merino, MD 01/09/21 1012

## 2021-01-08 NOTE — H&P (Addendum)
Advanced Heart Failure Team History and Physical Note   PCP:  [name], MD  PCP-Cardiology: [name], MD   HF MD: Dr [name]   Reason for Admission: A/C Systolic Heart Failure    HPI:   Mr Borboa is a 65 year old with a history of tobacco abuse, CAD, MI, HTN, hyperlipidemia, S/P Barostim, endocarditis, S/P ICD removal 12/04/20,  Barostim, ICM, and HFrEF.   Admitted 04/27/19 with anterior ST elevation. Taken to cath lab which showed chronically occluded RCA (L>>R collaterals) and thrombotic occlusion of proximal LAD. Underwent PCI of LAD. Echo w/ reduced LVEF 30-35% w/ apical aneurysm. RV ok. Post cath, he required milrinone for cardiogenic shock.   Underwent outpatient [facility] 11/29/20 as part of VAD w/u. He had low filling pressures and CI marginal at 2.3. Post procedure, developed severe rigors, n/v and fever to 102. He was given IVF and started on broad spectrum abx. He was admitted to ICU for suspected sepsis where he then developed acute hypoxic respiratory failure, requiring intubation. Blood Cx + strep. 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  Likely low-flow low gradient AS. Mean gradient 18, RV mild HK. He required inotropic support and eventually  weaned off. Hospitalization also c/b RUE DVT and aspiration pneumonia. He underwent ICD extraction 12/04/20, but Barostim was left as it is extravascular. Per ID, he will complete 6 weeks of IV ceftriaxone. GDMT was limited by hypotension (Coreg and Entresto stopped) and need for midodrine. Home on lasix 40 mg on M/F. He was discharged with LifeVest, PICC & HH PT/RN. D/c weight 184 lbs. Saw ID 12/27/20 with recommendation for follow up with dentist given culture results. Plan to continue ceftriazone until 01/15/21.    Saw Dr [name] in the HF clinic 12/18/20. At that time he was doing ok and continued on lasix 40 mg po every other day.   He was seen by ID 12/25/20 with  recommendations to continue ceftriaxone until 01/15/21 and to follow up with his dentist based on culture results.   Stopped lasix at least 5 days ago due nausea.  Developed progressive shortness of breath with exertion and at rest. Weight at home had been 174-175 pounds. He has been getting IV antibiotics daily via PICC.   Presented to ED via EMS. CXR with pulmonary edema. Given IV lasix. > 1 liter of urine output. Blood cx x2 obtained. Pertinent labs included: BNP 734, lactic acid 0.9, creatinine 0.6, WBC 7.4, and HS trop 86.   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 Mean gradient 18. VR = 0.19 VTI = 7.5. RV mild HK   - R/LHC (11/29/20):    Prox Cx lesion is 50% stenosed.   Prox RCA to Mid RCA lesion is 100% stenosed.   Ost RCA to Prox RCA lesion is 50% stenosed.   Non-stenotic Mid LAD lesion was previously treated.  Findings:  Ao = 81/50 (63) LV = 99/12 RA = 4 RV = 24/6 PA = 19/5 (14) PCW = 3 Fick cardiac output/index = 4.7/2.3 PVR = 2.3 WU Ao sat = 99% PA sat = 69%, 70%  Assessment: Stable 2v CAD Severe iCM EF 20% Low filling pressures with moderately reduced cardiac output Heavily calcified aortic valve (difficult to cross). Appears to be moderate by pressures but may have component of low-gradient, low-flow AS   - Echo (11/01/20): EF 20-25%  RV mildly HK. Moderate to severe low-flow AS DI 0.34 Aortic valve mean gradient measures 14.3 mmHg. Aortic valve peak gradient measures 24.3 mmHg. Aortic valve area, by VTI measures 1.44 cm.    -Echo (05/19/20): EF 20-25% RV mildly HK. moderate AS  Mean gradient 13 AVA 1.2 cm2DI 0.30   - Echo (10/29/19): EF 25-30% RV ok. Personally reviewed  - Echo (04/27/19): EF 30-35% RV normal.  Moderate HK LV. Grade IDD.      Review of Systems: [y] = yes, [ ] = no   General: Weight gain [ ]; Weight loss [ ]; Anorexia [ ]; Fatigue [ Y]; Fever [ ];  Chills [ ]; Weakness [ ]  Cardiac: Chest pain/pressure [ ]; Resting SOB [ ]; Exertional SOB [ Y]; Orthopnea [ ]; Pedal Edema [ ]; Palpitations [ ]; Syncope [ ]; Presyncope [ ]; Paroxysmal nocturnal dyspnea[ ]  Pulmonary: Cough [ ]; Wheezing[ ]; Hemoptysis[ ]; Sputum [ ]; Snoring [ ]  GI: Vomiting[ ]; Dysphagia[ ]; Melena[ ]; Hematochezia [ ]; Heartburn[ ]; Abdominal pain [ ]; Constipation [ ]; Diarrhea [ ]; BRBPR [ ]  GU: Hematuria[ ]; Dysuria [ ]; Nocturia[ ]  Vascular: Pain in legs with walking [ ]; Pain in feet with lying flat [ ]; Non-healing sores [ ]; Stroke [ ]; TIA [ ]; Slurred speech [ ];  Neuro: Headaches[ ]; Vertigo[ ]; Seizures[ ]; Paresthesias[ ];Blurred vision [ ]; Diplopia [ ]; Vision changes [ ]  Ortho/Skin: Arthritis [ ]; Joint pain [ ]; Muscle pain [ ]; Joint swelling [ ]; Back Pain [ ]; Rash [ ]  Psych: Depression[Y ]; Anxiety[ Y]  Heme: Bleeding problems [ ]; Clotting disorders [ ]; Anemia [ ]  Endocrine: Diabetes [ ]; Thyroid dysfunction[ ]   Home Medications Prior to Admission medications   Medication Sig Start Date End Date Taking? Authorizing Provider  acetaminophen (TYLENOL) 500 MG tablet Take 1,000 mg by mouth every 6 (six) hours as needed (pain.).    [provider]  apixaban (ELIQUIS) 5 MG TABS tablet Take 1 tablet (5 mg total) by mouth 2 (two) times daily. 12/11/20   Clegg, Amy D, NP  atorvastatin (LIPITOR) 80 MG tablet TAKE 1 TABLET BY MOUTH ONCE DAILY AT 6PM. 06/19/20   Hilty, Nadean Corwin, MD  Carboxymethylcellulose Sodium (ARTIFICIAL TEARS OP) Place 1 drop into both eyes daily as needed (dry/irritated eyes.).    [provider]  cefTRIAXone (ROCEPHIN) IVPB Inject 2 g into the vein daily. Indication:  bacteremia  First Dose: No Last Day of Therapy:  01/15/2021 Labs - Once weekly:  CBC/D and BMP, Labs - Every other week:  ESR and CRP Method of administration: IV Push Method of administration may be changed at the discretion of home infusion pharmacist  based upon assessment of the patient and/or caregiver's ability to self-administer the medication ordered. 12/11/20 01/21/21  Darrick Grinder D, NP  dapagliflozin propanediol (FARXIGA) 10 MG TABS tablet Take 1 tablet (10 mg total) by mouth in the morning. 12/11/20   Clegg, Amy D, NP  digoxin (LANOXIN) 0.125 MG tablet Take 1 tablet (0.125 mg total) by mouth daily. 12/12/20   Clegg, Amy D, NP  diphenhydrAMINE (BENADRYL) 25 MG tablet Take 50 mg by mouth at bedtime.    [provider]  eplerenone (INSPRA) 50 MG tablet Take 0.5 tablets (25 mg total) by mouth in the morning. 12/11/20   Clegg, Amy D, NP  ezetimibe (ZETIA) 10 MG tablet TAKE ONE TABLET  BY MOUTH DAILY 05/30/20   Lelon Perla, MD  furosemide (LASIX) 40 MG tablet Take 1 tablet (40 mg total) by mouth every other day. 12/18/20   Milford, Maricela Bo, FNP  gabapentin (NEURONTIN) 100 MG capsule Take 1 capsule (100 mg total) by mouth 3 (three) times daily. 12/11/20   Clegg, Amy D, NP  inFLIXimab in sodium chloride 0.9 % Inject into the vein every 6 (six) weeks.    [provider]  ivabradine (CORLANOR) 7.5 MG TABS tablet Take 1 tablet (7.5 mg total) by mouth 2 (two) times daily with a meal. 12/11/20   Clegg, Amy D, NP  melatonin 5 MG TABS Take 1 tablet (5 mg total) by mouth at bedtime. 12/11/20   Clegg, Amy D, NP  pantoprazole (PROTONIX) 40 MG tablet Take 1 tablet (40 mg total) by mouth daily. 12/12/20   Clegg, Amy D, NP  sertraline (ZOLOFT) 25 MG tablet Take 1 tablet (25 mg total) by mouth daily for 21 days, THEN 2 tablets (50 mg total) daily. 12/18/20 04/08/21  Rafael Bihari, FNP  tamsulosin (FLOMAX) 0.4 MG CAPS capsule TAKE 1 CAPSULE(0.4 MG) BY MOUTH DAILY 10/03/20   [name], MD    Past Medical History: Past Medical History:  Diagnosis Date   AICD (automatic cardioverter/defibrillator) present 08/31/2019   Ankylosing spondylitis ([place])    Arthritis    BENIGN PROSTATIC HYPERTROPHY 06/07/2008   CHF (congestive heart failure) ([place])     COLONIC POLYPS, HX OF 06/07/2008   Depression    H/O hiatal hernia    Heart failure ([place])    HYPERLIPIDEMIA 06/07/2008   HYPERTENSION 06/07/2008   Myocardial infarction [facility]) 2005   NSTEMI, s/p LAD stent   NEPHROLITHIASIS, HX OF 06/07/2008   STEMI (ST elevation myocardial infarction) ([place]) 04/27/2019    Past Surgical History: Past Surgical History:  Procedure Laterality Date   COLONOSCOPY WITH PROPOFOL N/A 04/24/2020   Procedure: COLONOSCOPY WITH PROPOFOL;  Surgeon: [name], MD;  Location: [facility];  Service: Gastroenterology;  Laterality: N/A;   CORONARY ANGIOPLASTY WITH STENT PLACEMENT  2005   LAD stent, jailed diagonal   CORONARY/GRAFT ACUTE MI REVASCULARIZATION N/A 04/27/2019   Procedure: Coronary/Graft Acute MI Revascularization;  Surgeon: [name], MD;  Location: [name] [facility];  Service: Cardiovascular;  Laterality: N/A;   ICD IMPLANT  08/31/2019   ICD IMPLANT N/A 08/31/2019   Procedure: ICD IMPLANT;  Surgeon: [name], MD;  Location: [place] CV [facility];  Service: Cardiovascular;  Laterality: N/A;   ICD LEAD REMOVAL N/A 12/04/2020   Procedure: ICD SYSTEM EXTRACTION;  Surgeon: Vickie Epley, MD;  Location: [place];  Service: Cardiovascular;  Laterality: N/A;   LAMINECTOMY     lumbar   LEFT HEART CATH AND CORONARY ANGIOGRAPHY N/A 04/27/2019   Procedure: LEFT HEART CATH AND CORONARY ANGIOGRAPHY;  Surgeon: [name], MD;  Location: [place] CV LAB;  Service: Cardiovascular;  Laterality: N/A;   LUMBAR FUSION  01/2012   POLYPECTOMY  04/24/2020   Procedure: POLYPECTOMY;  Surgeon: [name], MD;  Location: [facility];  Service: Gastroenterology;;   RIGHT HEART CATH N/A 04/27/2019   Procedure: RIGHT HEART CATH;  Surgeon: [name], Javarus M, MD;  Location: [place] [facility];  Service: Cardiovascular;  Laterality: N/A;   RIGHT/LEFT HEART CATH AND CORONARY ANGIOGRAPHY N/A 11/29/2020   Procedure: RIGHT/LEFT HEART CATH AND  CORONARY ANGIOGRAPHY;  Surgeon: Jolaine Artist, MD;  Location: [place] [facility];  Service: Cardiovascular;  Laterality: N/A;   TEE WITHOUT CARDIOVERSION N/A 12/04/2020   Procedure: TRANSESOPHAGEAL ECHOCARDIOGRAM (TEE);  Surgeon: Vickie Epley, MD;  Location: [facility] OR;  Service: Cardiovascular;  Laterality: N/A;   TONSILLECTOMY     warthin tumor removal Left 2014    Family History:  Family History  Problem Relation Age of Onset   Depression Father    Arthritis Sister        RA   Heart failure Mother    Other Neg Hx    Colon cancer Neg Hx    Esophageal cancer Neg Hx    Rectal cancer Neg Hx    Stomach cancer Neg Hx    Colon polyps Neg Hx     Social History: Social History   Socioeconomic History   Marital status: Single    Spouse name: Not on file   Number of children: Not on file   Years of education: Not on file   Highest education level: Not on file  Occupational History   Occupation: Best boy: [employer]  Tobacco Use   Smoking status: Former    Packs/day: 1.00    Years: 40.00    Pack years: 40.00    Types: Cigarettes    Quit date: 07/26/2019    Years since quitting: 1.4   Smokeless tobacco: Never   Tobacco comments:    1-1.5 pks per year x 40-45 yrs  Vaping Use   Vaping Use: Never used  Substance and Sexual Activity   Alcohol use: Yes    Alcohol/week: 7.0 standard drinks    Types: 7 Shots of liquor per week    Comment: "2 vodka tonics a night"   Drug use: Yes    Comment: occasionally - CBD oil   Sexual activity: Not on file  Other Topics Concern   Not on file  Social History Narrative   Lives in [place] alone   Works part time for a [employer] in [place] Strain: Low Risk    Difficulty of Paying Living Expenses: Not hard at all  Food Insecurity: No Food Insecurity   Worried About Charity fundraiser in the Last Year: Never true   Arboriculturist in the  Last Year: Never true  Transportation Needs: No Transportation Needs   Lack of Transportation (Medical): No   Lack of Transportation (Non-Medical): No  Physical Activity: Sufficiently Active   Days of Exercise per Week: 5 days   Minutes of Exercise per Session: 40 min  Stress: No Stress Concern Present   Feeling of Stress : Not at all  Social Connections: Socially Isolated   Frequency of Communication with Friends and Family: More than three times a week   Frequency of Social Gatherings with Friends and Family: More than three times a week   Attends Religious Services: Never   Marine scientist or Organizations: No   Attends Music therapist: Never   Marital Status: Never married    Allergies:  No Known Allergies  Objective:    Vital Signs:   Temp:  [97 F (36.1 C)] 97 F (36.1 C) (08/29 0600) Pulse Rate:  [95-106] 106 (08/29 0930) Resp:  [8-24] 22 (08/29 0930) BP: (128-149)/(75-91) 131/87 (08/29 0930) SpO2:  [97 %-100 %] 97 % (08/29 0930) Weight:  [79.4 kg] 79.4 kg (08/29 0600)   Filed Weights   01/08/21 0600  Weight: 79.4 kg  Physical Exam     General:  No respiratory difficulty HEENT: Normal Neck: Supple. JVP 10-11 . Carotids 2+ bilat; no bruits. No lymphadenopathy or thyromegaly appreciated. Cor: PMI nondisplaced. Regular rate & rhythm. No rubs, gallops . RUSB 2/6. Lungs: Clear Abdomen: Soft, nontender, nondistended. No hepatosplenomegaly. No bruits or masses. Good bowel sounds. Extremities: No cyanosis, clubbing, rash, edema. LUE single lumen PICC Neuro: Alert & oriented x 3, cranial nerves grossly intact. moves all 4 extremities w/o difficulty. Affect pleasant.   Telemetry   SR /Sinus Tach 90-100s  EKG   SR  Labs     Basic Metabolic Panel: Recent Labs  Lab 01/08/21 0731  NA 133*  K 3.7  CL 102  CO2 25  GLUCOSE 111*  BUN 9  CREATININE 0.61  CALCIUM 8.9    Liver Function Tests: Recent Labs  Lab 01/08/21 0731  AST  34  ALT 39  ALKPHOS 117  BILITOT 1.0  PROT 6.7  ALBUMIN 2.8*   No results for input(s): LIPASE, AMYLASE in the last 168 hours. No results for input(s): AMMONIA in the last 168 hours.  CBC: Recent Labs  Lab 01/08/21 0731  WBC 7.4  NEUTROABS 5.7  HGB 10.5*  HCT 34.4*  MCV 86.9  PLT 286    Cardiac Enzymes: No results for input(s): CKTOTAL, CKMB, CKMBINDEX, TROPONINI in the last 168 hours.  BNP: BNP (last 3 results) Recent Labs    11/29/20 2240 11/30/20 0144 01/08/21 0731  BNP 1,586.1* 2,653.1* 734.2*    ProBNP (last 3 results) Recent Labs    03/23/20 0922  PROBNP 520*     CBG: No results for input(s): GLUCAP in the last 168 hours.  Coagulation Studies: No results for input(s): LABPROT, INR in the last 72 hours.  Imaging: DG Chest Portable 1 View  Result Date: 01/08/2021 CLINICAL DATA:  Shortness of breath. EXAM: PORTABLE CHEST 1 VIEW COMPARISON:  12/04/2020 FINDINGS: 0725 hours. Diffuse interstitial and basilar airspace disease noted, progressive in the interval. Small bilateral pleural effusions associated. Cardiopericardial silhouette is at upper limits of normal for size. The visualized bony structures of the thorax show no acute abnormality. Left PICC line tip overlies the distal SVC. Telemetry leads overlie the chest. IMPRESSION: Interval progression of basilar predominant diffuse interstitial and alveolar lung disease. Pulmonary edema or diffuse infection could have this appearance. Small bilateral pleural effusions. Electronically Signed   By: Misty Stanley M.D.   On: 01/08/2021 07:45     Patient Profile   [name] is a 65 year old with a history of tobacco abuse, CAD, MI, HTN, hyperlipidemia, S/P Barostim, endocarditis, S/P ICD removal 12/04/20,  Barostim, ICM, and HFrEF.   Admitted with A/C HFrEF in the setting medication noncompliance. He stopped lasix 5 days ago.  Assessment/Plan   A/C HFrEF  Echo 05/19/20 EF 20-25% RV mildly HK. moderate AS  Mean  gradient 13 AVA 1.2 cm2 DI 0.30 - RHC CI 2.3 (7/22): EF 20%, low filling pressures. CI marginal at 2.3. -Admitted 11/2020 with shock in the setting of bacteremia. ICD extracted. Delene Loll was stopped due to hypotension.  On exam he appears "warm & wet" in the setting of medication noncompliance.  He has stopped lasix at least 5 days ago.  CXR with pulmonary edema. Lactic acid is not elevated.  - Continue IV lasix 40 mg twice a day. Can follow CVP once admitted. - Continue to hold off on entresto/bb. Can consider restarting entresto versus losartan tomorrow.  - Continue ivabradine 7.5 mg  twice a day  - Continue digoxin 0.125 mg daily  - Continue farxiga 10 mg daily  - Continue inspra 25 mg daily.  - Renal function stable.  - Down the road may need to consider advanced therapies but would not pursue until infection clears.   2. 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 Per EP (ok to leave Baromstim as it is extravascular). - He has been on IV ceftriaxone. Plan to complete course 01/15/21 - HH has been following. Will notify TOC   3. CAD s/p anterior STEMI (12/20). LHC showed chronically occluded RCA (with L>>R collaterals) and thrombotic occlusion of proximal LAD. Underwent PCI of LAD. - LHC (7/22) with stable CAD.  - On high intensity statin + eliquis.   4. Aortic Stenosis  AS is severe on TEE. - Likely low-flow low gradient severe AS. Mean gradient 18. DI = 0.19 VTI = 7.5 - Presence of fibroelastoma prevents TAVR.  5. RUE DVT On eliquis   6. Anxiety  Continue sertraline 25 mg daily.  7. H/O ETOH abuse No longer drinks alcohol  8. H/O Tobacco Abuse.   Consult cardiac rehab. Consult TOC for home antibiotics.   Darrick Grinder, NP 01/08/2021, 9:46 AM  Advanced Heart Failure Team Pager [phone] (M-F; 7a - 5p)  Please contact [facility] for night-coverage after hours (4p -7a ) and weekends on [email or web address]  Patient seen with NP, agree with the above note.   History as outlined above.  He developed nausea/upset stomach and stopped taking Lasix for about 5 days.  He had been taking Lasix 20 mg daily.  He developed progressive dyspnea and abdominal bloating, came to ER this morning.  BNP only mildly elevated relative to last admission, but CXR with pulmonary edema.  He has had 1 dose of Lasix 40 mg IV with good UOP, starting to feel better but still with orthopnea.   General: NAD Neck: JVP 12-13 cm, no thyromegaly or thyroid nodule.  Lungs: Clear to auscultation bilaterally with normal respiratory effort. CV: Nondisplaced PMI.  Heart regular S1/S2, no S3/S4, no murmur.  No peripheral edema.  No carotid bruit.  Normal pedal pulses.  Abdomen: Soft, nontender, no hepatosplenomegaly, no distention.  Skin: Intact without lesions or rashes.  Neurologic: Alert and oriented x 3.  Psych: Normal affect. Extremities: No clubbing or cyanosis.  HEENT: Normal.   Acute on chronic systolic CHF due to medication noncompliance.  Will start Lasix 40 mg IV bid, will bring him in overnight due to ongoing orthopnea, abnormal  CXR, and anxiety about going home.  Will follow CVP off PICC. Check co-ox and digoxin level. Continue his other home HF meds.   He will continue IV abx for Strep bacteremia until 9/5. Check blood cultures today.  He has had dental consult as outpatient given concern for odontogenic source. ICD is out.   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   Loralie Champagne 01/08/2021 10:33 AM

## 2021-01-08 NOTE — ED Notes (Signed)
Report given to Cherylann Ratel, RN

## 2021-01-09 ENCOUNTER — Other Ambulatory Visit (HOSPITAL_COMMUNITY): Payer: Self-pay

## 2021-01-09 DIAGNOSIS — I509 Heart failure, unspecified: Secondary | ICD-10-CM

## 2021-01-09 DIAGNOSIS — I5033 Acute on chronic diastolic (congestive) heart failure: Secondary | ICD-10-CM | POA: Diagnosis not present

## 2021-01-09 LAB — BASIC METABOLIC PANEL WITH GFR
Anion gap: 10 (ref 5–15)
BUN: 14 mg/dL (ref 8–23)
CO2: 25 mmol/L (ref 22–32)
Calcium: 9.3 mg/dL (ref 8.9–10.3)
Chloride: 98 mmol/L (ref 98–111)
Creatinine, Ser: 0.71 mg/dL (ref 0.61–1.24)
GFR, Estimated: >60 mL/min
Glucose, Bld: 112 mg/dL — ABNORMAL HIGH (ref 70–99)
Potassium: 3.2 mmol/L — ABNORMAL LOW (ref 3.5–5.1)
Sodium: 133 mmol/L — ABNORMAL LOW (ref 135–145)

## 2021-01-09 MED ORDER — FUROSEMIDE 40 MG PO TABS: 40.0000 mg | ORAL_TABLET | Freq: Every day | ORAL | 3 refills | Status: DC

## 2021-01-09 MED ORDER — SERTRALINE HCL 25 MG PO TABS: ORAL_TABLET | ORAL | 1 refills | Status: DC

## 2021-01-09 MED ORDER — POTASSIUM CHLORIDE CRYS ER 20 MEQ PO TBCR: 40.0000 meq | EXTENDED_RELEASE_TABLET | Freq: Once | ORAL | Status: DC

## 2021-01-09 MED ORDER — GABAPENTIN 100 MG PO CAPS
100.0000 mg | ORAL_CAPSULE | Freq: Three times a day (TID) | ORAL | 0 refills | Status: DC
  Filled 2021-01-09: qty 90, 30d supply, fill #0

## 2021-01-09 MED ORDER — PANTOPRAZOLE SODIUM 40 MG PO TBEC
40.0000 mg | DELAYED_RELEASE_TABLET | Freq: Every day | ORAL | 6 refills | Status: DC
  Filled 2021-01-09: qty 30, 30d supply, fill #0

## 2021-01-09 MED ORDER — FUROSEMIDE 20 MG PO TABS: 40.0000 mg | ORAL_TABLET | Freq: Every day | ORAL | Status: DC

## 2021-01-09 NOTE — Care Management CC44 (Signed)
Condition Code 44 Documentation Completed  Patient Details  Name: Brandon [name] MRN: [id number] Date of Birth: 08-26-1955   Condition Code 44 given:  Yes Patient signature on Condition Code 44 notice:  Yes Documentation of 2 MD's agreement:  Yes Code 44 added to claim:  Yes    Erenest Rasher, RN 01/09/2021, 8:36 AM

## 2021-01-09 NOTE — TOC CM/SW Note (Signed)
HF TOC CM spoke to pt at bedside. States he is still active with [facility]. Contacted [facility] to make aware pt will dc back home with resumption of care. Dustin Folks RN IV Infusion Coordinator is aware. [place], Heart Failure TOC CM [phone]

## 2021-01-09 NOTE — Discharge Summary (Addendum)
Advanced Heart Failure Team  Discharge Summary   Patient ID: [name] MRN: [id number], DOB/AGE: 07-17-1955 65 y.o. Admit date: 01/08/2021 D/C date:     01/09/2021   Primary Discharge Diagnoses:  A/C HFrEF  2. H/O Strep Bacteremia  11/2020 blood cx + step gordonae 3. CAD 4. Aortic Stenosis  5. RUE DVT 6. Anxiety  7. H/O ETOH abuse 8. H/O Tobacco Abuse  Hospital Course:  Mr Obst is a 65 year old with a history of tobacco abuse, CAD, MI, HTN, hyperlipidemia, S/P Barostim, endocarditis, S/P ICD removal 12/04/20,  Barostim, ICM, and HFrEF.    Admitted with A/C HFrEF in the setting medication noncompliance. He stopped taking lasix 5 days prior to admit. Diuresed with IV lasis and transitioned to lasix 40 mg po daily.  01/08/21 Blood cx obtained. Plan to continue rocephin daily until 01/15/21. He has dental follow up. He will continue HH with [facility] for home antbiotics and [facility] for HHRN and HHPT.  Plan to follow closely in the HF clinic as he continues work up for VAD. Our team called an updated his Patty. Pharmacy also met with him to discuss medications.   See below for detailed problem list.    A/C HFrEF  Echo 05/19/20 EF 20-25% RV mildly HK. moderate AS  Mean gradient 13 AVA 1.2 cm2 DI 0.30 - RHC CI 2.3 (7/22): EF 20%, low filling pressures. CI marginal at 2.3. -Admitted 11/2020 with shock in the setting of bacteremia. ICD extracted. Delene Loll was stopped due to hypotension.  Volume overloaded on admitin the setting of medication noncompliance.  He has stopped lasix at least 5 days ago.  CXR with pulmonary edema. Lactic acid wa not elevated.  - Diuresed with IV lasix and transitioned to lasix 40 mg po daily.  - Continue to hold off on entresto/bb. Can consider restarting entresto versus losartan at his follow up.  - Continue ivabradine 7.5 mg twice a day  - Continue digoxin 0.125 mg daily  - Continue farxiga 10 mg daily  - Continue inspra 25 mg daily.  - Renal function stable.  -  Down the road may need to consider advanced therapies but would not pursue until infection clears.    2. 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 Per EP (ok to leave Baromstim as it is extravascular). - Blood cultures obtained 01/08/21  - He has been on IV ceftriaxone. Plan to complete course 01/15/21 - HH has been following. Will notify TOC    3. CAD s/p anterior STEMI (12/20). LHC showed chronically occluded RCA (with L>>R collaterals) and thrombotic occlusion of proximal LAD. Underwent PCI of LAD. - LHC (7/22) with stable CAD.  - On high intensity statin + eliquis.    4. Aortic Stenosis  AS is severe on TEE. - Likely low-flow low gradient severe AS. Mean gradient 18. DI = 0.19 VTI = 7.5 - Presence of fibroelastoma prevents TAVR.   5. RUE DVT On eliquis    6. Anxiety  Continue sertraline 25 mg daily.   7. H/O ETOH abuse No longer drinks alcohol   8. H/O Tobacco Abuse.   Discharge Vitals: Blood pressure 126/84, pulse 99, temperature (!) 97.3 F (36.3 C), temperature source Oral, resp. rate 20, height 6' (1.829 m), weight 79.4 kg, SpO2 98 %. General:  No resp difficulty HEENT: normal Neck: supple.  no JVD. Carotids 2+ bilat; no bruits. No lymphadenopathy or thryomegaly appreciated. Cor: PMI nondisplaced. Regular rate & rhythm. No rubs, gallops. RUSB 2/6.  Lungs: clear Abdomen: soft, nontender, nondistended. No hepatosplenomegaly. No bruits or masses. Good bowel sounds. Extremities: no cyanosis, clubbing, rash, edema. RUE PICC Neuro: alert & orientedx3, cranial nerves grossly intact. moves all 4 extremities w/o difficulty. Affect pleasant  Telemetry: ST 100s.   Labs: Lab Results  Component Value Date   WBC 7.4 01/08/2021   HGB 10.5 (L) 01/08/2021   HCT 34.4 (L) 01/08/2021   MCV 86.9 01/08/2021   PLT 286 01/08/2021     Recent Labs  Lab 01/08/21 0731 01/09/21 0156  NA 133* 133*  K 3.7 3.2*  CL 102 98  CO2 25 25  BUN 9 14  CREATININE 0.61 0.71  CALCIUM 8.9 9.3  PROT 6.7  --   BILITOT 1.0  --   ALKPHOS 117  --   ALT 39  --   AST 34  --   GLUCOSE 111* 112*   Lab Results  Component Value Date   CHOL 140 04/04/2020   HDL 57 04/04/2020   LDLCALC 69 04/04/2020   TRIG 99 11/30/2020   BNP (last 3 results) Recent Labs    11/29/20 2240 11/30/20 0144 01/08/21 0731  BNP 1,586.1* 2,653.1* 734.2*    ProBNP (last 3 results) Recent Labs    03/23/20 0922  PROBNP 520*     Diagnostic Studies/Procedures   DG Chest Portable 1 View  Result Date: 01/08/2021 CLINICAL DATA:  Shortness of breath. EXAM: PORTABLE CHEST 1 VIEW COMPARISON:  12/04/2020 FINDINGS: 0725 hours. Diffuse interstitial and basilar airspace disease noted, progressive in the interval. Small bilateral pleural effusions associated. Cardiopericardial silhouette is at upper limits of normal for size. The visualized bony structures of the thorax show no acute abnormality. Left PICC line tip overlies the distal SVC. Telemetry leads overlie the chest. IMPRESSION: Interval progression of basilar predominant diffuse interstitial and alveolar lung disease. Pulmonary edema or diffuse infection could have this appearance. Small bilateral pleural effusions. Electronically Signed   By: Misty Stanley M.D.   On: 01/08/2021 07:45    Discharge Medications   Allergies as of 01/09/2021       Reactions   Sertraline Other (See Comments)   Made the patient start to feel suicidal        Medication List     TAKE these medications    acetaminophen 500 MG tablet Commonly known as: TYLENOL Take 1,000 mg by mouth every 6 (six) hours as needed (pain.).   apixaban 5 MG Tabs tablet Commonly known as: ELIQUIS Take 1 tablet (5 mg total) by mouth 2 (two) times daily.   ARTIFICIAL TEARS OP Place 1 drop into both eyes 3 (three) times daily as needed  (for dryness or irritation).   atorvastatin 80 MG tablet Commonly known as: LIPITOR TAKE 1 TABLET BY MOUTH ONCE DAILY AT 6PM. What changed: See the new instructions.   cefTRIAXone  IVPB Commonly known as: ROCEPHIN Inject 2 g into the vein daily. Indication:  bacteremia  First Dose: No Last Day of Therapy:  01/15/2021 Labs - Once weekly:  CBC/D and BMP, Labs - Every other week:  ESR and CRP Method of administration: IV Push Method of administration may be changed at the discretion of home infusion pharmacist based upon assessment of the patient and/or caregiver's ability to self-administer the medication ordered.   dapagliflozin propanediol 10 MG Tabs tablet Commonly known as: [place]  Take 1 tablet (10 mg total) by mouth in the morning.   digoxin 0.125 MG tablet Commonly known as: LANOXIN Take 1 tablet (0.125 mg total) by mouth daily.   diphenhydrAMINE 25 MG tablet Commonly known as: BENADRYL Take 50 mg by mouth at bedtime.   eplerenone 50 MG tablet Commonly known as: INSPRA Take 0.5 tablets (25 mg total) by mouth in the morning.   ezetimibe 10 MG tablet Commonly known as: ZETIA TAKE ONE TABLET BY MOUTH DAILY   furosemide 40 MG tablet Commonly known as: LASIX Take 1 tablet (40 mg total) by mouth daily.   gabapentin 100 MG capsule Commonly known as: NEURONTIN Take 1 capsule (100 mg total) by mouth 3 (three) times daily.   ivabradine 7.5 MG Tabs tablet Commonly known as: CORLANOR Take 1 tablet (7.5 mg total) by mouth 2 (two) times daily with a meal.   melatonin 5 MG Tabs Take 1 tablet (5 mg total) by mouth at bedtime.   pantoprazole 40 MG tablet Commonly known as: PROTONIX Take 1 tablet (40 mg total) by mouth daily. What changed: when to take this   Remicade 100 MG injection Generic drug: inFLIXimab Inject into the vein every 6 (six) weeks.   sertraline 25 MG tablet Commonly known as: Zoloft Take 2 tablets (50 mg total) by mouth daily for 21 days, THEN 2  tablets (50 mg total) daily. Start taking on: January 09, 2021 What changed: See the new instructions.   tamsulosin 0.4 MG Caps capsule Commonly known as: FLOMAX TAKE 1 CAPSULE(0.4 MG) BY MOUTH DAILY What changed: See the new instructions.        Disposition   The patient will be discharged in stable condition to home. Discharge Instructions     (HEART FAILURE PATIENTS) Call MD:  Anytime you have any of the following symptoms: 1) 3 pound weight gain in 24 hours or 5 pounds in 1 week 2) shortness of breath, with or without a dry hacking cough 3) swelling in the hands, feet or stomach 4) if you have to sleep on extra pillows at night in order to breathe.   Complete by: As directed    Diet - low sodium heart healthy   Complete by: As directed    Increase activity slowly   Complete by: As directed           Duration of Discharge Encounter: Greater than 35 minutes   Signed, Amy Clegg NP-C 01/09/2021, 7:36 AM   Patient seen and examined with the above-signed Advanced Practice Provider and/or Housestaff. I personally reviewed laboratory data, imaging studies and relevant notes. I independently examined the patient and formulated the important aspects of the plan. I have edited the note to reflect any of my changes or salient points. I have personally discussed the plan with the patient and/or family.  Volume status much improved. Breathing better. No orthopnea or PND. Labs stable  General: Lying in bed. Stressed No resp difficulty HEENT: normal Neck: supple. no JVD. Carotids 2+ bilat; + bruits. No lymphadenopathy or thryomegaly appreciated. Cor: PMI nondisplaced. Regular rate & rhythm. 3/6 AS Lungs: clear Abdomen: soft, nontender, nondistended. No hepatosplenomegaly. No bruits or masses. Good bowel sounds. Extremities: no cyanosis, clubbing, rash, edema Neuro: alert & orientedx3, cranial nerves grossly intact. moves all 4 extremities w/o difficulty. Affect pleasant  Volume  status improved. Labs stable. Remains on IV abx. He is very anxious and stressed about VAD. We had long talk about pros/cons.   I think he can  go home today with q2week f/u in HF Clinic. Increase zoloft to 50.   Darrick Grinder, NP  7:36 AM  Patient seen and examined with the above-signed Advanced Practice Provider and/or Housestaff. I personally reviewed laboratory data, imaging studies and relevant notes. I independently examined the patient and formulated the important aspects of the plan. I have edited the note to reflect any of my changes or salient points. I have personally discussed the plan with the patient and/or family.  Volume status much improved. [place] for d/c. Will f/u in HF Clinic.   Glori Bickers, MD  4:58 PM

## 2021-01-09 NOTE — Care Management Obs Status (Signed)
[place] NOTIFICATION   Patient Details  Name: [place] [name] MRN: [id number] Date of Birth: June 05, 1955   Medicare Observation Status Notification Given:  Yes    Erenest Rasher, RN 01/09/2021, 8:36 AM

## 2021-01-13 LAB — CULTURE, BLOOD (ROUTINE X 2)
Culture: NO GROWTH
Culture: NO GROWTH
Special Requests: ADEQUATE
Special Requests: ADEQUATE

## 2021-01-16 ENCOUNTER — Telehealth (HOSPITAL_COMMUNITY): Payer: Self-pay | Admitting: *Deleted

## 2021-01-16 NOTE — Telephone Encounter (Signed)
Home health called to get verbal orders to continue visits. Verbal given.  Also asked if it was ok to increase gabapentin due to right arm pain at night.   Routed to [place] for advice  Call back #[phone]

## 2021-01-17 NOTE — Telephone Encounter (Signed)
Left detailed vm °

## 2021-01-18 ENCOUNTER — Ambulatory Visit (HOSPITAL_COMMUNITY)
Admission: RE | Admit: 2021-01-18 | Discharge: 2021-01-18 | Disposition: A | Discharge status: Home / Self Care | Payer: Medicare Other | Source: Ambulatory Visit | Attending: Internal Medicine | Admitting: Internal Medicine

## 2021-01-18 ENCOUNTER — Other Ambulatory Visit: Payer: Self-pay

## 2021-01-18 ENCOUNTER — Encounter (HOSPITAL_COMMUNITY): Payer: Self-pay | Admitting: Internal Medicine

## 2021-01-18 VITALS — BP 102/60 | HR 98 | Wt 171.2 lb

## 2021-01-18 DIAGNOSIS — I255 Ischemic cardiomyopathy: Secondary | ICD-10-CM | POA: Insufficient documentation

## 2021-01-18 DIAGNOSIS — I110 Hypertensive heart disease with heart failure: Secondary | ICD-10-CM | POA: Insufficient documentation

## 2021-01-18 DIAGNOSIS — R7881 Bacteremia: Secondary | ICD-10-CM | POA: Insufficient documentation

## 2021-01-18 DIAGNOSIS — I2510 Atherosclerotic heart disease of native coronary artery without angina pectoris: Secondary | ICD-10-CM | POA: Diagnosis not present

## 2021-01-18 DIAGNOSIS — Z87891 Personal history of nicotine dependence: Secondary | ICD-10-CM | POA: Diagnosis not present

## 2021-01-18 DIAGNOSIS — Z955 Presence of coronary angioplasty implant and graft: Secondary | ICD-10-CM | POA: Diagnosis not present

## 2021-01-18 DIAGNOSIS — Z7984 Long term (current) use of oral hypoglycemic drugs: Secondary | ICD-10-CM | POA: Diagnosis not present

## 2021-01-18 DIAGNOSIS — I330 Acute and subacute infective endocarditis: Secondary | ICD-10-CM | POA: Diagnosis not present

## 2021-01-18 DIAGNOSIS — I82621 Acute embolism and thrombosis of deep veins of right upper extremity: Secondary | ICD-10-CM | POA: Insufficient documentation

## 2021-01-18 DIAGNOSIS — F419 Anxiety disorder, unspecified: Secondary | ICD-10-CM | POA: Diagnosis not present

## 2021-01-18 DIAGNOSIS — Z95810 Presence of automatic (implantable) cardiac defibrillator: Secondary | ICD-10-CM | POA: Insufficient documentation

## 2021-01-18 DIAGNOSIS — Z8249 Family history of ischemic heart disease and other diseases of the circulatory system: Secondary | ICD-10-CM | POA: Insufficient documentation

## 2021-01-18 DIAGNOSIS — E785 Hyperlipidemia, unspecified: Secondary | ICD-10-CM | POA: Insufficient documentation

## 2021-01-18 DIAGNOSIS — I252 Old myocardial infarction: Secondary | ICD-10-CM | POA: Diagnosis not present

## 2021-01-18 DIAGNOSIS — Z7901 Long term (current) use of anticoagulants: Secondary | ICD-10-CM | POA: Diagnosis not present

## 2021-01-18 DIAGNOSIS — I5022 Chronic systolic (congestive) heart failure: Secondary | ICD-10-CM | POA: Insufficient documentation

## 2021-01-18 DIAGNOSIS — I350 Nonrheumatic aortic (valve) stenosis: Secondary | ICD-10-CM | POA: Insufficient documentation

## 2021-01-18 DIAGNOSIS — Z79899 Other long term (current) drug therapy: Secondary | ICD-10-CM | POA: Diagnosis not present

## 2021-01-18 LAB — BRAIN NATRIURETIC PEPTIDE: B Natriuretic Peptide: 607.9 pg/mL — ABNORMAL HIGH (ref 0.0–100.0)

## 2021-01-18 LAB — BASIC METABOLIC PANEL
Anion gap: 11 (ref 5–15)
BUN: 10 mg/dL (ref 8–23)
CO2: 25 mmol/L (ref 22–32)
Calcium: 9 mg/dL (ref 8.9–10.3)
Chloride: 98 mmol/L (ref 98–111)
Creatinine, Ser: 0.62 mg/dL (ref 0.61–1.24)
GFR, Estimated: >60 mL/min (ref >60)
Glucose, Bld: 96 mg/dL (ref 70–99)
Potassium: 3.4 mmol/L — ABNORMAL LOW (ref 3.5–5.1)
Sodium: 134 mmol/L — ABNORMAL LOW (ref 135–145)

## 2021-01-18 LAB — DIGOXIN LEVEL: Digoxin Level: 0.5 ng/mL — ABNORMAL LOW (ref 0.8–2.0)

## 2021-01-18 MED ORDER — FUROSEMIDE 40 MG PO TABS: 40.0000 mg | ORAL_TABLET | Freq: Every day | ORAL | 3 refills | Status: DC

## 2021-01-18 MED ORDER — POTASSIUM CHLORIDE CRYS ER 20 MEQ PO TBCR: 20.0000 meq | EXTENDED_RELEASE_TABLET | Freq: Every day | ORAL | 6 refills | Status: DC

## 2021-01-18 NOTE — Progress Notes (Signed)
Advanced Heart Failure Clinic Note Date:  01/18/2021   ID:  Br[name], DOB 01/28/1956, MRN [id number]  Location: Home  Provider location: [facility] Type of Visit: Established patient  PCP:  [name], MD  Cardiologist:  [name], MD Primary HF: [name]  Chief Complaint: Post hospitalization heart failure follow-up   History of Present Illness:  Brandon [name] is a 65 y.o.smoker (originally from [name], [place]) with h/o CAD s/p previous MI, HTN, HL.   He was admitted 04/27/19 with anterior ST elevation. Taken to cath lab which showed chronically occluded RCA (L>>R collaterals) and thrombotic occlusion of proximal LAD. Underwent PCI of LAD. Echo w/ reduced LVEF 30-35% w/ apical aneurysm. RV ok. Post cath, he required milrinone for cardiogenic shock.   Now s/p Barostim.  Echo 6/22 F 20-25% RV mildly HK. Moderate to severe low-flow AS DI 0.34. Referred for TAVR evaluation. PFTs ordered.  Underwent outpatient [facility] 11/29/20 as part of VAD w/u. He had low filling pressures and CI marginal at 2.3. Post procedure, developed severe rigors, n/v and fever to 102. He was given IVF and started on broad spectrum abx. He was admitted to ICU for suspected sepsis where he then developed acute hypoxic respiratory failure, requiring intubation. Blood Cx + strep. 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  Likely low-flow low gradient AS. Mean gradient 18, RV mild HK. He required inotropic support and eventually  weaned off. Hospitalization also c/b RUE DVT and aspiration pneumonia. He underwent ICD extraction 12/04/20, but Barostim was left as it is extravascular. Per ID, he will complete 6 weeks of IV ceftriaxone. GDMT was limited by hypotension (Coreg and Entresto stopped) and need for midodrine. Home on lasix 40 mg on M/F. He was discharged with LifeVest, PICC & HH PT/RN. D/c weight 184 lbs.  Here for f/u. Was in  the ER last week with volume overload after dropping lasix dose from 40 to 20. Responded well to IV lasix in ER. Finished IV abx on Tuesday 9/6. PICC line removed. No f/c. Follows up with Dr. [name] on 9/20. Hard sleeping due to orthopnea or PND.    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 Mean gradient 18. VR = 0.19 VTI = 7.5. RV mild HK  - R/LHC (11/29/20):    Prox Cx lesion is 50% stenosed.   Prox RCA to Mid RCA lesion is 100% stenosed.   Ost RCA to Prox RCA lesion is 50% stenosed.   Non-stenotic Mid LAD lesion was previously treated.   Findings:   Ao = 81/50 (63) LV = 99/12 RA = 4 RV = 24/6 PA = 19/5 (14) PCW = 3 Fick cardiac output/index = 4.7/2.3 PVR = 2.3 WU Ao sat = 99% PA sat = 69%, 70%   Assessment: Stable 2v CAD Severe iCM EF 20% Low filling pressures with moderately reduced cardiac output Heavily calcified aortic valve (difficult to cross). Appears to be moderate by pressures but may have component of low-gradient, low-flow AS  - Echo (11/01/20): EF 20-25% RV mildly HK. Moderate to severe low-flow AS DI 0.34 Aortic valve mean gradient measures 14.3 mmHg. Aortic valve peak gradient measures 24.3 mmHg. Aortic valve area, by VTI measures 1.44 cm.  - Echo (05/19/20): EF 20-25% RV mildly HK. moderate AS  Mean gradient 13 AVA 1.2 cm2DI 0.30   - Echo (  04/27/19): EF 30-35% RV normal.  Moderate HK LV. Grade IDD.   - Echo (10/29/19): EF 25-30% RV ok. Personally reviewed  Past Medical History:  Diagnosis Date   AICD (automatic cardioverter/defibrillator) present 08/31/2019   Ankylosing spondylitis ([place])    Arthritis    BENIGN PROSTATIC HYPERTROPHY 06/07/2008   CHF (congestive heart failure) ([place])    COLONIC POLYPS, HX OF 06/07/2008   Depression    H/O hiatal hernia    Heart failure ([place])    HYPERLIPIDEMIA 06/07/2008   HYPERTENSION 06/07/2008   Myocardial infarction  [facility]) 2005   NSTEMI, s/p LAD stent   NEPHROLITHIASIS, HX OF 06/07/2008   STEMI (ST elevation myocardial infarction) ([place]) 04/27/2019   Past Surgical History:  Procedure Laterality Date   COLONOSCOPY WITH PROPOFOL N/A 04/24/2020   Procedure: COLONOSCOPY WITH PROPOFOL;  Surgeon: [name], MD;  Location: [facility];  Service: Gastroenterology;  Laterality: N/A;   CORONARY ANGIOPLASTY WITH STENT PLACEMENT  2005   LAD stent, jailed diagonal   CORONARY/GRAFT ACUTE MI REVASCULARIZATION N/A 04/27/2019   Procedure: Coronary/Graft Acute MI Revascularization;  Surgeon: [name], MD;  Location: [place] [facility];  Service: Cardiovascular;  Laterality: N/A;   ICD IMPLANT  08/31/2019   ICD IMPLANT N/A 08/31/2019   Procedure: ICD IMPLANT;  Surgeon: [name], MD;  Location: [place] CV LAB;  Service: Cardiovascular;  Laterality: N/A;   ICD LEAD REMOVAL N/A 12/04/2020   Procedure: ICD SYSTEM EXTRACTION;  Surgeon: Vickie Epley, MD;  Location: [facility];  Service: Cardiovascular;  Laterality: N/A;   LAMINECTOMY     lumbar   LEFT HEART CATH AND CORONARY ANGIOGRAPHY N/A 04/27/2019   Procedure: LEFT HEART CATH AND CORONARY ANGIOGRAPHY;  Surgeon: [name], MD;  Location: [place] CV LAB;  Service: Cardiovascular;  Laterality: N/A;   LUMBAR FUSION  01/2012   POLYPECTOMY  04/24/2020   Procedure: POLYPECTOMY;  Surgeon: [name], MD;  Location: [facility];  Service: Gastroenterology;;   RIGHT HEART CATH N/A 04/27/2019   Procedure: RIGHT HEART CATH;  Surgeon: [name], Mehtaab M, MD;  Location: [facility] CV LAB;  Service: Cardiovascular;  Laterality: N/A;   RIGHT/LEFT HEART CATH AND CORONARY ANGIOGRAPHY N/A 11/29/2020   Procedure: RIGHT/LEFT HEART CATH AND CORONARY ANGIOGRAPHY;  Surgeon: Jolaine Artist, MD;  Location: [place] CV LAB;  Service: Cardiovascular;  Laterality: N/A;   TEE WITHOUT CARDIOVERSION N/A 12/04/2020   Procedure: TRANSESOPHAGEAL  ECHOCARDIOGRAM (TEE);  Surgeon: Vickie Epley, MD;  Location: [facility];  Service: Cardiovascular;  Laterality: N/A;   TONSILLECTOMY     warthin tumor removal Left 2014   Current Outpatient Medications  Medication Sig Dispense Refill   acetaminophen (TYLENOL) 500 MG tablet Take 1,000 mg by mouth every 6 (six) hours as needed (pain.).     apixaban (ELIQUIS) 5 MG TABS tablet Take 1 tablet (5 mg total) by mouth 2 (two) times daily. 60 tablet 6   atorvastatin (LIPITOR) 80 MG tablet TAKE 1 TABLET BY MOUTH ONCE DAILY AT 6PM. 90 tablet 3   Carboxymethylcellulose Sodium (ARTIFICIAL TEARS OP) Place 1 drop into both eyes 3 (three) times daily as needed (for dryness or irritation).     dapagliflozin propanediol (FARXIGA) 10 MG TABS tablet Take 1 tablet (10 mg total) by mouth in the morning. 30 tablet 6   digoxin (LANOXIN) 0.125 MG tablet Take 1 tablet (0.125 mg total) by mouth daily. 30 tablet 6   diphenhydrAMINE (BENADRYL) 25 MG tablet Take 50  mg by mouth at bedtime.     eplerenone (INSPRA) 50 MG tablet Take 0.5 tablets (25 mg total) by mouth in the morning. 30 tablet 6   ezetimibe (ZETIA) 10 MG tablet TAKE ONE TABLET BY MOUTH DAILY 90 tablet 3   furosemide (LASIX) 40 MG tablet Take 1 tablet (40 mg total) by mouth daily. 30 tablet 3   gabapentin (NEURONTIN) 100 MG capsule Take 100 mg by mouth 4 (four) times daily.     ivabradine (CORLANOR) 7.5 MG TABS tablet Take 1 tablet (7.5 mg total) by mouth 2 (two) times daily with a meal. 60 tablet 6   melatonin 5 MG TABS Take 1 tablet (5 mg total) by mouth at bedtime. 30 tablet 6   pantoprazole (PROTONIX) 40 MG tablet Take 1 tablet (40 mg total) by mouth daily. 30 tablet 6   sertraline (ZOLOFT) 25 MG tablet Take 50 mg by mouth daily.     tamsulosin (FLOMAX) 0.4 MG CAPS capsule TAKE 1 CAPSULE(0.4 MG) BY MOUTH DAILY 90 capsule 1   REMICADE 100 MG injection Inject into the vein every 6 (six) weeks.     No current facility-administered medications for this  encounter.   Allergies:   Sertraline   Social History:  The patient  reports that he quit smoking about 17 months ago. His smoking use included cigarettes. He has a 40.00 pack-year smoking history. He has never used smokeless tobacco. He reports current alcohol use of about 7.0 standard drinks per week. He reports current drug use.   Family History:  The patient's family history includes Arthritis in his sister; Depression in his father; Heart failure in his mother.   ROS:  Please see the history of present illness.   All other systems are personally reviewed and negative.   Recent Labs: 03/23/2020: NT-Pro BNP 520 12/06/2020: Magnesium 1.9 01/08/2021: ALT 39; B Natriuretic Peptide 734.2; Hemoglobin 10.5; Platelets 286 01/09/2021: BUN 14; Creatinine, Ser 0.71; Potassium 3.2; Sodium 133  Personally reviewed   Wt Readings from Last 3 Encounters:  01/18/21 77.7 kg (171 lb 3.2 oz)  01/08/21 79.4 kg (175 lb)  12/25/20 78.7 kg (173 lb 9.6 oz)   BP 102/60   Pulse 98   Wt 77.7 kg (171 lb 3.2 oz)   SpO2 97%   BMI 23.22 kg/m    Exam:   General:  Thin appearing. No resp difficulty HEENT: normal Neck: supple. no JVD. Carotids 2+ bilat; + bruits. No lymphadenopathy or thryomegaly appreciated. Cor: PMI nondisplaced. Regular rate & rhythm. 3/6 AS s2 obliterated Lungs: clear Abdomen: soft, nontender, nondistended. No hepatosplenomegaly. No bruits or masses. Good bowel sounds. Extremities: no cyanosis, clubbing, rash, edema Neuro: alert & orientedx3, cranial nerves grossly intact. moves all 4 extremities w/o difficulty. Affect pleasant   ASSESSMENT AND PLAN:  1. Chronic Systolic Heart Failure, ICM - Echo 12/20 EF post anterior MI 30 to 35%. - Echo 6/21 EF 25-30% RV ok - Echo 05/19/20 EF 20-25% RV mildly HK. moderate AS  Mean gradient 13 AVA 1.2 cm2 DI 0.30 - RHC this admit (7/22): EF 20%, low filling pressures. CI marginal at 2.3. - NYHA III, although functional class difficult as he is not  very active. Volume good today. - No bb /Entresto with soft BP today.  - ReDS 37%  - NYHA III-IIIB mildly volume overloaded. Increase lasix to 40 bid on Monday and Friday. 40mg  daily other days - Continue digoxin 0.125 mg daily. - Continue Farxiga 10 mg daily - Continue eplerenone  25 mg daily.   - Continue ivabradine 7.5 mg bid,  - Off Entresto due to low BP - ICD extracted with infection. No with LifeVest - LVAD w/u has been on hold due to bacteremia. Now off abx. Surveillance cultures pending. Has follow-up with ID Clinic. Will discuss clearance with them. Likely not transplant candidate given age and lung disease.  - Labs today. BMET, CBC and dig level.   2. Strep Bacteremia - Blood cx + for Strep gordonii last admit (7/22). 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 Per EP (ok to leave Baromstim as it is extravascular). - Finished 6 weeks IV ceftriaxone on Tuesday 9/6. PICC line removed. No f/c.  - Plan as above  3. CAD - s/p anterior STEMI (12/20). LHC showed chronically occluded RCA (with L>>R collaterals) and thrombotic occlusion of proximal LAD. Underwent PCI of LAD. - LHC (7/22) with stable CAD.  - Had NSTEMI in setting of sepsis. - No s/s ischemia - Continue ASA/statin.   4. Aortic Stenosis - AS is severe on TEE. - Likely low-flow low gradient severe AS. Mean gradient 18. DI = 0.19 VTI = 7.5 - Presence of fibroelastoma prevents TAVR.  5.  Hyperlipidemia: - Now followed by Dr. Debara Pickett in [facility] and 10 mg Zetia added.  - Last LDL 69   6. RUE DVT - On Eliquis. - No bleeding issues.  - Still w/ right arm pain but improving  7. Tobacco abuse - Former smoker. - Remains abstinent.  8. ETOH  - He has quit drinking.    9. Anxiety - Continue zoloft  Total time spent 35 minutes. Over half that time spent discussing above.     Signed, Glori Bickers, MD   01/18/2021 3:33 PM  Advanced Heart Failure [place] [address] [facility] [place] [address] [phone] (office) [phone] (fax)

## 2021-01-18 NOTE — Patient Instructions (Signed)
Start Potassium (klor-con) 20 meq Daily **TAKE 3 TABS TODAY**  Continue Furosemide 40 mg Daily, take extra 40 mg on Mondays and Fridays  Labs done today, your results will be available in MyChart, we will contact you for abnormal readings.  Your physician recommends that you schedule a follow-up appointment in: 1 month  If you have any questions or concerns before your next appointment please send [place] a message through [place] or call our office at [phone].    TO LEAVE A MESSAGE FOR THE NURSE SELECT OPTION 2, PLEASE LEAVE A MESSAGE INCLUDING: YOUR NAME DATE OF BIRTH CALL BACK NUMBER REASON FOR CALL**this is important as we prioritize the call backs  YOU WILL RECEIVE A CALL BACK THE SAME DAY AS LONG AS YOU CALL BEFORE 4:00 PM  At the [facility], you and your health needs are our priority. As part of our continuing mission to provide you with exceptional heart care, we have created designated Provider Care Teams. These Care Teams include your primary Cardiologist (physician) and Advanced Practice Providers (APPs- Physician Assistants and Nurse Practitioners) who all work together to provide you with the care you need, when you need it.   You may see any of the following providers on your designated Care Team at your next follow up: Dr Glori Bickers Dr Loralie Champagne Dr Patrice Paradise, NP Lyda Jester, [place] Ginnie Smart Audry Riles, PharmD   Please be sure to bring in all your medications bottles to every appointment.

## 2021-01-18 NOTE — Progress Notes (Signed)
ReDS Vest / Clip - 01/18/21 1600       ReDS Vest / Clip   Station Marker D    Ruler Value 26.5    ReDS Value Range Moderate volume overload    ReDS Actual Value 37

## 2021-01-22 ENCOUNTER — Other Ambulatory Visit (HOSPITAL_COMMUNITY): Payer: Self-pay | Admitting: *Deleted

## 2021-01-22 DIAGNOSIS — I5022 Chronic systolic (congestive) heart failure: Secondary | ICD-10-CM

## 2021-01-22 DIAGNOSIS — I255 Ischemic cardiomyopathy: Secondary | ICD-10-CM

## 2021-01-22 NOTE — Progress Notes (Signed)
Spoke with [name] regarding need for follow up in VAD clinic following surveillance blood cultures on 01/30/21. Received verbal permission over phone from patient to schedule carotid dopplers & ABIs for same day as VAD clinic appt. Dopplers/ABI scheduled 02/06/21 at 0800, with VAD clinic appt to follow at 10:00. Pt verbalized understanding.  Emerson Monte RN [place]y Coordinator  Office: [phone]  24/7 Pager: [phone]

## 2021-01-29 ENCOUNTER — Other Ambulatory Visit (HOSPITAL_COMMUNITY): Payer: Self-pay

## 2021-01-30 ENCOUNTER — Other Ambulatory Visit: Payer: Self-pay

## 2021-01-30 ENCOUNTER — Other Ambulatory Visit (HOSPITAL_COMMUNITY): Payer: Self-pay | Admitting: *Deleted

## 2021-01-30 ENCOUNTER — Encounter: Payer: Self-pay | Admitting: Infectious Disease

## 2021-01-30 ENCOUNTER — Ambulatory Visit (INDEPENDENT_AMBULATORY_CARE_PROVIDER_SITE_OTHER): Payer: Medicare Other | Admitting: Infectious Disease

## 2021-01-30 VITALS — BP 108/68 | HR 89 | Temp 98.4°F | Wt 171.6 lb

## 2021-01-30 DIAGNOSIS — M4328 Fusion of spine, sacral and sacrococcygeal region: Secondary | ICD-10-CM | POA: Diagnosis not present

## 2021-01-30 DIAGNOSIS — K029 Dental caries, unspecified: Secondary | ICD-10-CM

## 2021-01-30 DIAGNOSIS — I255 Ischemic cardiomyopathy: Secondary | ICD-10-CM

## 2021-01-30 DIAGNOSIS — B955 Unspecified streptococcus as the cause of diseases classified elsewhere: Secondary | ICD-10-CM | POA: Diagnosis not present

## 2021-01-30 DIAGNOSIS — T827XXD Infection and inflammatory reaction due to other cardiac and vascular devices, implants and grafts, subsequent encounter: Secondary | ICD-10-CM

## 2021-01-30 DIAGNOSIS — R7881 Bacteremia: Secondary | ICD-10-CM | POA: Diagnosis present

## 2021-01-30 DIAGNOSIS — I5023 Acute on chronic systolic (congestive) heart failure: Secondary | ICD-10-CM

## 2021-01-30 DIAGNOSIS — T80219D Unspecified infection due to central venous catheter, subsequent encounter: Secondary | ICD-10-CM

## 2021-01-30 MED ORDER — PANTOPRAZOLE SODIUM 40 MG PO TBEC: 40.0000 mg | DELAYED_RELEASE_TABLET | Freq: Every day | ORAL | 6 refills | Status: DC

## 2021-01-30 MED ORDER — GABAPENTIN 100 MG PO CAPS: 100.0000 mg | ORAL_CAPSULE | Freq: Four times a day (QID) | ORAL | 3 refills | Status: DC

## 2021-01-30 NOTE — Progress Notes (Signed)
Subjective:  Chief complaint follow-up for Streptococcus gordonae bacteremia in the context of ICD that was removed   Patient ID: Brandon [name], male    DOB: [birth year], 65 y.o.   MRN: [id number]  HPI  Brandon [name] is a 65 year old Caucasian man with advanced heart failure who developed streptococcus gordonae I bacteremia with septic and cardiogenic shock.  He underwent a transesophageal echocardiogram that showed what could be a fibroelastic Thoma versus a vegetation.  He was initially critically ill in the ICU on multiple pressors and intubated.  Ultimately he stabilized blood cultures cleared and his central lines were removed.  He had a CT of his chest abdomen pelvis which was clear in terms of showing any source of infection.  He was not able to have a Panorex to look for odontogenic source of infection.  His ICD was extracted and he has been placed on a course of ceftriaxone which is finishing out on September 5.  He is a patient of which not only reimplantation of an ICD is being considered but also LVAD is being considered.  Both of these however would require that we prove that he is sterilized his blood in the interim.  Seems to be tolerating the ceftriaxone without difficulty.  His stop date is September 5.  Line has been removed.  He is here for surveillance blood cultures he is also being seen by cardiology and has an appointment with the [place] clinic  Is also seen dental who have done Panorex films and look for teeth that might need attention.         Past Medical History:  Diagnosis Date   AICD (automatic cardioverter/defibrillator) present 08/31/2019   Ankylosing spondylitis ([place])    Arthritis    BENIGN PROSTATIC HYPERTROPHY 06/07/2008   CHF (congestive heart failure) ([place])    COLONIC POLYPS, HX OF 06/07/2008   Depression    H/O hiatal hernia    Heart failure ([place])    HYPERLIPIDEMIA 06/07/2008   HYPERTENSION 06/07/2008   Myocardial infarction [facility]) 2005   NSTEMI,  s/p LAD stent   NEPHROLITHIASIS, HX OF 06/07/2008   STEMI (ST elevation myocardial infarction) ([place]) 04/27/2019    Past Surgical History:  Procedure Laterality Date   COLONOSCOPY WITH PROPOFOL N/A 04/24/2020   Procedure: COLONOSCOPY WITH PROPOFOL;  Surgeon: [name], MD;  Location: [facility] END[facility];  Service: Gastroenterology;  Laterality: N/A;   CORONARY ANGIOPLASTY WITH STENT PLACEMENT  2005   LAD stent, jailed diagonal   CORONARY/GRAFT ACUTE MI REVASCULARIZATION N/A 04/27/2019   Procedure: Coronary/Graft Acute MI Revascularization;  Surgeon: [name], MD;  Location: [place] CV LAB;  Service: Cardiovascular;  Laterality: N/A;   ICD IMPLANT  08/31/2019   ICD IMPLANT N/A 08/31/2019   Procedure: ICD IMPLANT;  Surgeon: [name], MD;  Location: [place] CV LAB;  Service: Cardiovascular;  Laterality: N/A;   ICD LEAD REMOVAL N/A 12/04/2020   Procedure: ICD SYSTEM EXTRACTION;  Surgeon: Vickie Epley, MD;  Location: [place];  Service: Cardiovascular;  Laterality: N/A;   LAMINECTOMY     lumbar   LEFT HEART CATH AND CORONARY ANGIOGRAPHY N/A 04/27/2019   Procedure: LEFT HEART CATH AND CORONARY ANGIOGRAPHY;  Surgeon: [name], MD;  Location: [place] CV LAB;  Service: Cardiovascular;  Laterality: N/A;   LUMBAR FUSION  01/2012   POLYPECTOMY  04/24/2020   Procedure: POLYPECTOMY;  Surgeon: [name], MD;  Location: [facility];  Service: Gastroenterology;;   RIGHT HEART CATH N/A  04/27/2019   Procedure: RIGHT HEART CATH;  Surgeon: [name], Leah M, MD;  Location: [place] CV LAB;  Service: Cardiovascular;  Laterality: N/A;   RIGHT/LEFT HEART CATH AND CORONARY ANGIOGRAPHY N/A 11/29/2020   Procedure: RIGHT/LEFT HEART CATH AND CORONARY ANGIOGRAPHY;  Surgeon: Jolaine Artist, MD;  Location: [place] CV LAB;  Service: Cardiovascular;  Laterality: N/A;   TEE WITHOUT CARDIOVERSION N/A 12/04/2020   Procedure: TRANSESOPHAGEAL ECHOCARDIOGRAM  (TEE);  Surgeon: Vickie Epley, MD;  Location: [facility] OR;  Service: Cardiovascular;  Laterality: N/A;   TONSILLECTOMY     warthin tumor removal Left 2014    Family History  Problem Relation Age of Onset   Depression Father    Arthritis Sister        RA   Heart failure Mother    Other Neg Hx    Colon cancer Neg Hx    Esophageal cancer Neg Hx    Rectal cancer Neg Hx    Stomach cancer Neg Hx    Colon polyps Neg Hx       Social History   Socioeconomic History   Marital status: Single    Spouse name: Not on file   Number of children: Not on file   Years of education: Not on file   Highest education level: Not on file  Occupational History   Occupation: Best boy: [employer]  Tobacco Use   Smoking status: Former    Packs/day: 1.00    Years: 40.00    Pack years: 40.00    Types: Cigarettes    Quit date: 07/26/2019    Years since quitting: 1.5   Smokeless tobacco: Never   Tobacco comments:    1-1.5 pks per year x 40-45 yrs  Vaping Use   Vaping Use: Never used  Substance and Sexual Activity   Alcohol use: Yes    Alcohol/week: 7.0 standard drinks    Types: 7 Shots of liquor per week    Comment: "2 vodka tonics a night"   Drug use: Yes    Comment: occasionally - CBD oil   Sexual activity: Not on file  Other Topics Concern   Not on file  Social History Narrative   Lives in [place] alone   Works part time for a [employer] in [place] Strain: Low Risk    Difficulty of Paying Living Expenses: Not hard at all  Food Insecurity: No Food Insecurity   Worried About Charity fundraiser in the Last Year: Never true   Arboriculturist in the Last Year: Never true  Transportation Needs: No Transportation Needs   Lack of Transportation (Medical): No   Lack of Transportation (Non-Medical): No  Physical Activity: Sufficiently Active   Days of Exercise per Week: 5 days   Minutes of Exercise  per Session: 40 min  Stress: No Stress Concern Present   Feeling of Stress : Not at all  Social Connections: Socially Isolated   Frequency of Communication with Friends and Family: More than three times a week   Frequency of Social Gatherings with Friends and Family: More than three times a week   Attends Religious Services: Never   Marine scientist or Organizations: No   Attends Archivist Meetings: Never   Marital Status: Never married    Allergies  Allergen Reactions   Sertraline Other (See Comments)    Made the patient start to feel  suicidal     Current Outpatient Medications:    acetaminophen (TYLENOL) 500 MG tablet, Take 1,000 mg by mouth every 6 (six) hours as needed (pain.)., Disp: , Rfl:    apixaban (ELIQUIS) 5 MG TABS tablet, Take 1 tablet (5 mg total) by mouth 2 (two) times daily., Disp: 60 tablet, Rfl: 6   atorvastatin (LIPITOR) 80 MG tablet, TAKE 1 TABLET BY MOUTH ONCE DAILY AT 6PM., Disp: 90 tablet, Rfl: 3   Carboxymethylcellulose Sodium (ARTIFICIAL TEARS OP), Place 1 drop into both eyes 3 (three) times daily as needed (for dryness or irritation)., Disp: , Rfl:    dapagliflozin propanediol (FARXIGA) 10 MG TABS tablet, Take 1 tablet (10 mg total) by mouth in the morning., Disp: 30 tablet, Rfl: 6   digoxin (LANOXIN) 0.125 MG tablet, Take 1 tablet (0.125 mg total) by mouth daily., Disp: 30 tablet, Rfl: 6   diphenhydrAMINE (BENADRYL) 25 MG tablet, Take 50 mg by mouth at bedtime., Disp: , Rfl:    eplerenone (INSPRA) 50 MG tablet, Take 0.5 tablets (25 mg total) by mouth in the morning., Disp: 30 tablet, Rfl: 6   ezetimibe (ZETIA) 10 MG tablet, TAKE ONE TABLET BY MOUTH DAILY, Disp: 90 tablet, Rfl: 3   furosemide (LASIX) 40 MG tablet, Take 1 tablet (40 mg total) by mouth daily. Take extra tab on Monday and Friday, Disp: 40 tablet, Rfl: 3   gabapentin (NEURONTIN) 100 MG capsule, Take 100 mg by mouth 4 (four) times daily., Disp: , Rfl:    ivabradine (CORLANOR) 7.5  MG TABS tablet, Take 1 tablet (7.5 mg total) by mouth 2 (two) times daily with a meal., Disp: 60 tablet, Rfl: 6   melatonin 5 MG TABS, Take 1 tablet (5 mg total) by mouth at bedtime., Disp: 30 tablet, Rfl: 6   pantoprazole (PROTONIX) 40 MG tablet, Take 1 tablet (40 mg total) by mouth daily., Disp: 30 tablet, Rfl: 6   potassium chloride SA (KLOR-CON) 20 MEQ tablet, Take 1 tablet (20 mEq total) by mouth daily., Disp: 30 tablet, Rfl: 6   REMICADE 100 MG injection, Inject into the vein every 6 (six) weeks., Disp: , Rfl:    sertraline (ZOLOFT) 25 MG tablet, Take 50 mg by mouth daily., Disp: , Rfl:    tamsulosin (FLOMAX) 0.4 MG CAPS capsule, TAKE 1 CAPSULE(0.4 MG) BY MOUTH DAILY, Disp: 90 capsule, Rfl: 1   Review of Systems  Constitutional:  Negative for activity change, appetite change, chills, diaphoresis, fatigue, fever and unexpected weight change.  HENT:  Negative for congestion, rhinorrhea, sinus pressure, sneezing, sore throat and trouble swallowing.   Eyes:  Negative for photophobia and visual disturbance.  Respiratory:  Negative for cough, chest tightness, shortness of breath, wheezing and stridor.   Cardiovascular:  Negative for chest pain, palpitations and leg swelling.  Gastrointestinal:  Negative for abdominal distention, abdominal pain, anal bleeding, blood in stool, constipation, diarrhea, nausea and vomiting.  Genitourinary:  Negative for difficulty urinating, dysuria, flank pain and hematuria.  Musculoskeletal:  Negative for arthralgias, back pain, gait problem, joint swelling and myalgias.  Skin:  Negative for color change, pallor, rash and wound.  Neurological:  Negative for dizziness, tremors, weakness and light-headedness.  Hematological:  Negative for adenopathy. Does not bruise/bleed easily.  Psychiatric/Behavioral:  Negative for agitation, behavioral problems, confusion, decreased concentration, dysphoric mood and sleep disturbance.       Objective:   Physical  Exam Constitutional:      Appearance: He is well-developed.  HENT:  Head: Normocephalic and atraumatic.  Eyes:     Conjunctiva/sclera: Conjunctivae normal.  Cardiovascular:     Rate and Rhythm: Normal rate and regular rhythm.  Pulmonary:     Effort: Pulmonary effort is normal. No respiratory distress.     Breath sounds: No wheezing.  Abdominal:     General: There is no distension.     Palpations: Abdomen is soft.  Musculoskeletal:        General: No tenderness. Normal range of motion.     Cervical back: Normal range of motion and neck supple.  Skin:    General: Skin is warm and dry.     Coloration: Skin is not pale.     Findings: No erythema or rash.  Neurological:     General: No focal deficit present.     Mental Status: He is alert and oriented to person, place, and time.  Psychiatric:        Mood and Affect: Mood normal.        Behavior: Behavior normal.        Thought Content: Thought content normal.        Judgment: Judgment normal.   PICC clean           Assessment & Plan:   Streptococcus gordonae with possible endocarditis and ICD infection status post removal of ICD  Will check surveillance blood cultures today from 2 sites  Advanced heart failure: He is going to be seen in that clinic for consideration of LVAD

## 2021-01-31 ENCOUNTER — Ambulatory Visit: Payer: Medicare Other | Admitting: Internal Medicine

## 2021-02-05 LAB — CULTURE, BLOOD (SINGLE)
MICRO NUMBER:: 12415321
MICRO NUMBER:: 12415322
Result:: NO GROWTH
Result:: NO GROWTH
SPECIMEN QUALITY:: ADEQUATE
SPECIMEN QUALITY:: ADEQUATE

## 2021-02-06 ENCOUNTER — Telehealth (HOSPITAL_COMMUNITY): Payer: Self-pay | Admitting: Licensed Clinical Social Worker

## 2021-02-06 ENCOUNTER — Ambulatory Visit (HOSPITAL_COMMUNITY)
Admission: RE | Admit: 2021-02-06 | Discharge: 2021-02-06 | Disposition: A | Discharge status: Home / Self Care | Payer: Medicare Other | Source: Ambulatory Visit | Attending: Family Medicine | Admitting: Family Medicine

## 2021-02-06 ENCOUNTER — Encounter (HOSPITAL_COMMUNITY): Payer: Self-pay

## 2021-02-06 ENCOUNTER — Ambulatory Visit (HOSPITAL_BASED_OUTPATIENT_CLINIC_OR_DEPARTMENT_OTHER)
Admission: RE | Admit: 2021-02-06 | Discharge: 2021-02-06 | Disposition: A | Discharge status: Home / Self Care | Payer: Medicare Other | Source: Ambulatory Visit | Attending: Internal Medicine | Admitting: Internal Medicine

## 2021-02-06 ENCOUNTER — Other Ambulatory Visit (HOSPITAL_COMMUNITY): Payer: Self-pay | Admitting: Internal Medicine

## 2021-02-06 ENCOUNTER — Other Ambulatory Visit: Payer: Self-pay

## 2021-02-06 VITALS — BP 114/77 | HR 81 | Wt 171.6 lb

## 2021-02-06 DIAGNOSIS — I5022 Chronic systolic (congestive) heart failure: Secondary | ICD-10-CM

## 2021-02-06 DIAGNOSIS — I2510 Atherosclerotic heart disease of native coronary artery without angina pectoris: Secondary | ICD-10-CM | POA: Diagnosis not present

## 2021-02-06 DIAGNOSIS — I5023 Acute on chronic systolic (congestive) heart failure: Secondary | ICD-10-CM | POA: Diagnosis present

## 2021-02-06 DIAGNOSIS — I255 Ischemic cardiomyopathy: Secondary | ICD-10-CM

## 2021-02-06 DIAGNOSIS — Z7901 Long term (current) use of anticoagulants: Secondary | ICD-10-CM | POA: Insufficient documentation

## 2021-02-06 DIAGNOSIS — I82621 Acute embolism and thrombosis of deep veins of right upper extremity: Secondary | ICD-10-CM | POA: Insufficient documentation

## 2021-02-06 DIAGNOSIS — Z79899 Other long term (current) drug therapy: Secondary | ICD-10-CM | POA: Diagnosis not present

## 2021-02-06 DIAGNOSIS — Z8249 Family history of ischemic heart disease and other diseases of the circulatory system: Secondary | ICD-10-CM | POA: Insufficient documentation

## 2021-02-06 DIAGNOSIS — Z7982 Long term (current) use of aspirin: Secondary | ICD-10-CM | POA: Insufficient documentation

## 2021-02-06 DIAGNOSIS — I252 Old myocardial infarction: Secondary | ICD-10-CM | POA: Diagnosis not present

## 2021-02-06 DIAGNOSIS — Z87891 Personal history of nicotine dependence: Secondary | ICD-10-CM | POA: Diagnosis not present

## 2021-02-06 DIAGNOSIS — J984 Other disorders of lung: Secondary | ICD-10-CM | POA: Insufficient documentation

## 2021-02-06 DIAGNOSIS — I110 Hypertensive heart disease with heart failure: Secondary | ICD-10-CM | POA: Diagnosis not present

## 2021-02-06 DIAGNOSIS — Z7984 Long term (current) use of oral hypoglycemic drugs: Secondary | ICD-10-CM | POA: Diagnosis not present

## 2021-02-06 DIAGNOSIS — Z95810 Presence of automatic (implantable) cardiac defibrillator: Secondary | ICD-10-CM | POA: Insufficient documentation

## 2021-02-06 DIAGNOSIS — I350 Nonrheumatic aortic (valve) stenosis: Secondary | ICD-10-CM | POA: Insufficient documentation

## 2021-02-06 DIAGNOSIS — I330 Acute and subacute infective endocarditis: Secondary | ICD-10-CM | POA: Diagnosis not present

## 2021-02-06 DIAGNOSIS — Z955 Presence of coronary angioplasty implant and graft: Secondary | ICD-10-CM | POA: Insufficient documentation

## 2021-02-06 DIAGNOSIS — R7881 Bacteremia: Secondary | ICD-10-CM | POA: Insufficient documentation

## 2021-02-06 DIAGNOSIS — F419 Anxiety disorder, unspecified: Secondary | ICD-10-CM | POA: Diagnosis not present

## 2021-02-06 DIAGNOSIS — E785 Hyperlipidemia, unspecified: Secondary | ICD-10-CM | POA: Insufficient documentation

## 2021-02-06 LAB — COMPREHENSIVE METABOLIC PANEL
ALT: 19 U/L (ref 0–44)
AST: 19 U/L (ref 15–41)
Albumin: 3.8 g/dL (ref 3.5–5.0)
Alkaline Phosphatase: 136 U/L — ABNORMAL HIGH (ref 38–126)
Anion gap: 9 (ref 5–15)
BUN: 17 mg/dL (ref 8–23)
CO2: 29 mmol/L (ref 22–32)
Calcium: 9.7 mg/dL (ref 8.9–10.3)
Chloride: 98 mmol/L (ref 98–111)
Creatinine, Ser: 0.74 mg/dL (ref 0.61–1.24)
GFR, Estimated: >60 mL/min (ref >60)
Glucose, Bld: 117 mg/dL — ABNORMAL HIGH (ref 70–99)
Potassium: 3.6 mmol/L (ref 3.5–5.1)
Sodium: 136 mmol/L (ref 135–145)
Total Bilirubin: 0.7 mg/dL (ref 0.3–1.2)
Total Protein: 8.1 g/dL (ref 6.5–8.1)

## 2021-02-06 LAB — CBC
HCT: 41.2 % (ref 39.0–52.0)
Hemoglobin: 12.6 g/dL — ABNORMAL LOW (ref 13.0–17.0)
MCH: 26.3 pg (ref 26.0–34.0)
MCHC: 30.6 g/dL (ref 30.0–36.0)
MCV: 85.8 fL (ref 80.0–100.0)
Platelets: 316 10*3/uL (ref 150–400)
RBC: 4.8 MIL/uL (ref 4.22–5.81)
RDW: 17 % — ABNORMAL HIGH (ref 11.5–15.5)
WBC: 9.7 10*3/uL (ref 4.0–10.5)
nRBC: 0 % (ref 0.0–0.2)

## 2021-02-06 LAB — PROTIME-INR
INR: 1.1 (ref 0.8–1.2)
Prothrombin Time: 13.9 seconds (ref 11.4–15.2)

## 2021-02-06 LAB — RAPID URINE DRUG SCREEN, HOSP PERFORMED
Amphetamines: NOT DETECTED
Barbiturates: NOT DETECTED
Benzodiazepines: NOT DETECTED
Cocaine: NOT DETECTED
Opiates: NOT DETECTED
Tetrahydrocannabinol: NOT DETECTED

## 2021-02-06 LAB — T4, FREE: Free T4: 1.12 ng/dL (ref 0.61–1.12)

## 2021-02-06 LAB — URIC ACID: Uric Acid, Serum: 4.5 mg/dL (ref 3.7–8.6)

## 2021-02-06 LAB — LIPID PANEL
Cholesterol: 122 mg/dL (ref 0–200)
HDL: 37 mg/dL — ABNORMAL LOW (ref >40)
LDL Cholesterol: 60 mg/dL (ref 0–99)
Total CHOL/HDL Ratio: 3.3 RATIO
Triglycerides: 124 mg/dL (ref <150)
VLDL: 25 mg/dL (ref 0–40)

## 2021-02-06 LAB — TSH: TSH: 0.917 u[IU]/mL (ref 0.350–4.500)

## 2021-02-06 LAB — LACTATE DEHYDROGENASE: LDH: 158 U/L (ref 98–192)

## 2021-02-06 LAB — PREALBUMIN: Prealbumin: 22.4 mg/dL (ref 18–38)

## 2021-02-06 NOTE — Patient Instructions (Addendum)
No change in medications You will see Dr. Cyndia Bent on 10/14, after your visit with him the VAD coordinators will call you with scheduling details for you LVAD.  You will be hearing from our social worker to set up an appt time for social evaluation for LVAD.

## 2021-02-06 NOTE — Progress Notes (Signed)
ReDS Vest / Clip - 02/06/21 1000       ReDS Vest / Clip   Station Marker D    Ruler Value 30    ReDS Value Range Low volume    ReDS Actual Value 33

## 2021-02-06 NOTE — Progress Notes (Signed)
Advanced [facility] Clinic Note Date:  02/06/2021   ID:  Brandon [name], DOB 01/06/1956, MRN [id number]  Location: Home  Provider location:  [facility] Clinic Type of Visit: Established patient  PCP:  [name], MD  Cardiologist:  [name], MD Primary HF: Alijah Akram  Chief Complaint: Heart Failure follow up   History of Present Illness:  Brandon [name] is a 65 y.o.smoker (originally from [place], [place]) with h/o CAD s/p previous MI, HTN, HL.   He was admitted 04/27/19 with anterior ST elevation. Taken to cath lab which showed chronically occluded RCA (L>>R collaterals) and thrombotic occlusion of proximal LAD. Underwent PCI of LAD. Echo w/ reduced LVEF 30-35% w/ apical aneurysm. RV ok. Post cath, he required milrinone for cardiogenic shock.   Now s/p Barostim.  Echo 6/22 F 20-25% RV mildly HK. Moderate to severe low-flow AS DI 0.34. Referred for TAVR evaluation. PFTs ordered.  Underwent outpatient [facility] 11/29/20 as part of VAD w/u. He had low filling pressures and CI marginal at 2.3. Post procedure, developed severe rigors, n/v and fever to 102. He was given IVF and started on broad spectrum abx. He was admitted to ICU for suspected sepsis where he then developed acute hypoxic respiratory failure, requiring intubation. Blood Cx + strep. 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  Likely low-flow low gradient AS. Mean gradient 18, RV mild HK. He required inotropic support and eventually  weaned off. Hospitalization also c/b RUE DVT and aspiration pneumonia. He underwent ICD extraction 12/04/20, but Barostim was left as it is extravascular. Per ID, he will complete 6 weeks of IV ceftriaxone. GDMT was limited by hypotension (Coreg and Entresto stopped) and need for midodrine. Home on lasix 40 mg on M/F. He was discharged with LifeVest, PICC & HH PT/RN. D/c weight 184 lbs.  Completed IV antibiotics on 01/16/21. Saw ID  and had repeat blood cultures on 01/30/21. Blood cultures negative.   Saw Dr [name] on 01/18/21 and had mild volume overload. Lasix was increased 40 mg twice a day on Monday and Friday then 40 mg daily.  Today he returns for HF follow up.Overall feeling ok. Complaining of R arm discomfort but says its some better. Does not like wearing Life Vest. SOB with activity. + Orthopnea. Denies PND/Orthopnea. Appetite ok. No fever or chills. Weight at home 171  pounds. Walking with PT. Taking all medications.  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 Mean gradient 18. VR = 0.19 VTI = 7.5. RV mild HK  - R/LHC (11/29/20):    Prox Cx lesion is 50% stenosed.   Prox RCA to Mid RCA lesion is 100% stenosed.   Ost RCA to Prox RCA lesion is 50% stenosed.   Non-stenotic Mid LAD lesion was previously treated.   Findings:  Ao = 81/50 (63) LV = 99/12 RA = 4 RV = 24/6 PA = 19/5 (14) PCW = 3 Fick cardiac output/index = 4.7/2.3 PVR = 2.3 WU Ao sat = 99% PA sat = 69%, 70%  Assessment: Stable 2v CAD Severe iCM EF 20% Low filling pressures with moderately reduced cardiac output Heavily calcified aortic valve (difficult to cross). Appears to be moderate by pressures but may have component of low-gradient, low-flow AS  - Echo (11/01/20): EF 20-25% RV mildly HK. Moderate to severe low-flow AS DI 0.34 Aortic valve mean gradient measures 14.3 mmHg.  Aortic valve peak gradient measures 24.3 mmHg. Aortic valve area, by VTI measures 1.44 cm.  - Echo (05/19/20): EF 20-25% RV mildly HK. moderate AS  Mean gradient 13 AVA 1.2 cm2DI 0.30   - Echo (04/27/19): EF 30-35% RV normal.  Moderate HK LV. Grade IDD.   - Echo (10/29/19): EF 25-30% RV ok. Personally reviewed  Past Medical History:  Diagnosis Date   AICD (automatic cardioverter/defibrillator) present 08/31/2019   Ankylosing spondylitis ([place])    Arthritis     BENIGN PROSTATIC HYPERTROPHY 06/07/2008   CHF (congestive heart failure) ([place])    COLONIC POLYPS, HX OF 06/07/2008   Depression    H/O hiatal hernia    Heart failure ([place])    HYPERLIPIDEMIA 06/07/2008   HYPERTENSION 06/07/2008   Myocardial infarction [facility]) 2005   NSTEMI, s/p LAD stent   NEPHROLITHIASIS, HX OF 06/07/2008   STEMI (ST elevation myocardial infarction) ([place]) 04/27/2019   Past Surgical History:  Procedure Laterality Date   COLONOSCOPY WITH PROPOFOL N/A 04/24/2020   Procedure: COLONOSCOPY WITH PROPOFOL;  Surgeon: [name], MD;  Location: [facility]COPY;  Service: Gastroenterology;  Laterality: N/A;   CORONARY ANGIOPLASTY WITH STENT PLACEMENT  2005   LAD stent, jailed diagonal   CORONARY/GRAFT ACUTE MI REVASCULARIZATION N/A 04/27/2019   Procedure: Coronary/Graft Acute MI Revascularization;  Surgeon: [name], MD;  Location: [place] CV LAB;  Service: Cardiovascular;  Laterality: N/A;   ICD IMPLANT  08/31/2019   ICD IMPLANT N/A 08/31/2019   Procedure: ICD IMPLANT;  Surgeon: [name], MD;  Location: [place] CV LAB;  Service: Cardiovascular;  Laterality: N/A;   ICD LEAD REMOVAL N/A 12/04/2020   Procedure: ICD SYSTEM EXTRACTION;  Surgeon: Vickie Epley, MD;  Location: [place];  Service: Cardiovascular;  Laterality: N/A;   LAMINECTOMY     lumbar   LEFT HEART CATH AND CORONARY ANGIOGRAPHY N/A 04/27/2019   Procedure: LEFT HEART CATH AND CORONARY ANGIOGRAPHY;  Surgeon: [name], MD;  Location: [place] CV LAB;  Service: Cardiovascular;  Laterality: N/A;   LUMBAR FUSION  01/2012   POLYPECTOMY  04/24/2020   Procedure: POLYPECTOMY;  Surgeon: [name], MD;  Location: [facility];  Service: Gastroenterology;;   RIGHT HEART CATH N/A 04/27/2019   Procedure: RIGHT HEART CATH;  Surgeon: [name], Toan M, MD;  Location: [place] CV LAB;  Service: Cardiovascular;  Laterality: N/A;   RIGHT/LEFT HEART CATH AND CORONARY ANGIOGRAPHY N/A  11/29/2020   Procedure: RIGHT/LEFT HEART CATH AND CORONARY ANGIOGRAPHY;  Surgeon: Jolaine Artist, MD;  Location: [place] CV LAB;  Service: Cardiovascular;  Laterality: N/A;   TEE WITHOUT CARDIOVERSION N/A 12/04/2020   Procedure: TRANSESOPHAGEAL ECHOCARDIOGRAM (TEE);  Surgeon: Vickie Epley, MD;  Location: [facility] OR;  Service: Cardiovascular;  Laterality: N/A;   TONSILLECTOMY     warthin tumor removal Left 2014   Current Outpatient Medications  Medication Sig Dispense Refill   acetaminophen (TYLENOL) 500 MG tablet Take 1,000 mg by mouth every 6 (six) hours as needed (pain.).     apixaban (ELIQUIS) 5 MG TABS tablet Take 1 tablet (5 mg total) by mouth 2 (two) times daily. 60 tablet 6   atorvastatin (LIPITOR) 80 MG tablet TAKE 1 TABLET BY MOUTH ONCE DAILY AT 6PM. 90 tablet 3   Carboxymethylcellulose Sodium (ARTIFICIAL TEARS OP) Place 1 drop into both eyes 3 (three) times daily as needed (for dryness or irritation).     dapagliflozin propanediol (FARXIGA) 10 MG TABS tablet Take 1 tablet (10  mg total) by mouth in the morning. 30 tablet 6   digoxin (LANOXIN) 0.125 MG tablet Take 1 tablet (0.125 mg total) by mouth daily. 30 tablet 6   diphenhydrAMINE (BENADRYL) 25 MG tablet Take 50 mg by mouth at bedtime.     eplerenone (INSPRA) 50 MG tablet Take 0.5 tablets (25 mg total) by mouth in the morning. 30 tablet 6   ezetimibe (ZETIA) 10 MG tablet TAKE ONE TABLET BY MOUTH DAILY 90 tablet 3   furosemide (LASIX) 40 MG tablet Take 1 tablet (40 mg total) by mouth daily. Take extra tab on Monday and Friday 40 tablet 3   gabapentin (NEURONTIN) 100 MG capsule Take 1 capsule (100 mg total) by mouth 4 (four) times daily. 120 capsule 3   ivabradine (CORLANOR) 7.5 MG TABS tablet Take 1 tablet (7.5 mg total) by mouth 2 (two) times daily with a meal. 60 tablet 6   melatonin 5 MG TABS Take 1 tablet (5 mg total) by mouth at bedtime. 30 tablet 6   pantoprazole (PROTONIX) 40 MG tablet Take 1 tablet (40 mg total) by  mouth daily. 30 tablet 6   potassium chloride SA (KLOR-CON) 20 MEQ tablet Take 1 tablet (20 mEq total) by mouth daily. 30 tablet 6   sertraline (ZOLOFT) 25 MG tablet Take 50 mg by mouth daily.     tamsulosin (FLOMAX) 0.4 MG CAPS capsule TAKE 1 CAPSULE(0.4 MG) BY MOUTH DAILY 90 capsule 1   REMICADE 100 MG injection Inject into the vein every 6 (six) weeks. (Patient not taking: Reported on 02/06/2021)     No current facility-administered medications for this encounter.   Allergies:   Sertraline   Social History:  The patient  reports that he quit smoking about 18 months ago. His smoking use included cigarettes. He has a 40.00 pack-year smoking history. He has never used smokeless tobacco. He reports current alcohol use of about 7.0 standard drinks per week. He reports current drug use.   Family History:  The patient's family history includes Arthritis in his sister; Depression in his father; Heart failure in his mother.   ROS:  Please see the history of present illness.   All other systems are personally reviewed and negative.   Recent Labs: 03/23/2020: NT-Pro BNP 520 12/06/2020: Magnesium 1.9 01/08/2021: ALT 39; Hemoglobin 10.5; Platelets 286 01/18/2021: B Natriuretic Peptide 607.9; BUN 10; Creatinine, Ser 0.62; Potassium 3.4; Sodium 134  Personally reviewed   Wt Readings from Last 3 Encounters:  02/06/21 77.8 kg (171 lb 9.6 oz)  01/30/21 77.8 kg (171 lb 9.6 oz)  01/18/21 77.7 kg (171 lb 3.2 oz)   BP 114/77   Pulse 81   Wt 77.8 kg (171 lb 9.6 oz)   SpO2 99%   BMI 23.27 kg/m   REDS Clip 33%  Exam:   General:  Well appearing. No resp difficulty. Wearing Life Vest.  HEENT: normal Neck: supple. no JVD. Carotids 2+ bilat; no bruits. No lymphadenopathy or thryomegaly appreciated. Cor: PMI nondisplaced. Regular rate & rhythm. No rubs, gallops . 2/6 AS. Wearing Life Vest  Lungs: clear Abdomen: soft, nontender, nondistended. No hepatosplenomegaly. No bruits or masses. Good bowel  sounds. Extremities: no cyanosis, clubbing, rash, edema Neuro: alert & orientedx3, cranial nerves grossly intact. moves all 4 extremities w/o difficulty. Affect pleasant    ASSESSMENT AND PLAN:  1. Chronic Systolic Heart Failure, ICM - Echo 12/20 EF post anterior MI 30 to 35%. - Echo 6/21 EF 25-30% RV ok - Echo 05/19/20 EF  20-25% RV mildly HK. moderate AS  Mean gradient 13 AVA 1.2 cm2 DI 0.30 - RHC this admit (7/22): EF 20%, low filling pressures. CI marginal at 2.3. - NYHA III- No bb /Entresto with soft BP today.   Volume status stable. Reds Clip 33%. Continue lasix to 40 bid on Monday and Friday and 40mg  daily other days - Continue digoxin 0.125 mg daily. 01/18/21 Dig level 0.5  - Continue Farxiga 10 mg daily - Continue eplerenone 25 mg daily.   - Continue ivabradine 7.5 mg bid,  - Off Entresto due to low BP - ICD extracted with infection. No with LifeVest - Will set up [place]  - LVAD w/u has been on hold due to bacteremia. Now off abx. Surveillance cultures pending. Has follow-up with [facility]. Will discuss clearance with them. Likely not transplant candidate given age and lung disease.     2. Strep Bacteremia - Blood cx + for Strep gordonii last admit (7/22). 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 Per EP (ok to leave Baromstim as it is extravascular). - Finished 6 weeks IV ceftriaxone on Tuesday 9/6. PICC line removed.  Repeat blood cultures negative 01/30/21.   3. CAD - s/p anterior STEMI (12/20). LHC showed chronically occluded RCA (with L>>R collaterals) and thrombotic occlusion of proximal LAD. Underwent PCI of LAD. - LHC (7/22) with stable CAD.  - Had NSTEMI in setting of sepsis. - No chest pain.  - Continue ASA/statin.   4. Aortic Stenosis - AS is severe on TEE. - Likely low-flow low gradient severe AS. Mean gradient 18. DI = 0.19 VTI = 7.5 - Presence of fibroelastoma  prevents TAVR.  5.  Hyperlipidemia: - Now followed by Dr. Debara Pickett in [facility] and 10 mg Zetia added.  - Last LDL 69   6. RUE DVT - On Eliquis. - No bleeding issues.  - Still w/ right arm pain but improving  7. Tobacco abuse - Former smoker. - Remains abstinent.  8. ETOH  - He has quit drinking.    9. Anxiety - Continue zoloft  He has follow with Dr Cyndia Bent next month. He would like to pursue VAD. Would need RHC prior to implant.       Jeanmarie Hubert, NP  02/06/2021 9:57 AM  Advanced Heart Failure [place] [address] [phone] (office) [phone] (fax)

## 2021-02-06 NOTE — Progress Notes (Addendum)
Patient presents for VAD consult in [facility] today alone. Recent hospitalization with initiation of VAD evaluation.    Pt has been seen recently in the heart failure clinic by Dr. [name]. Pt was hospitalized in July for bacteremia. VAD team has [place] for clearance of infection in order to proceed with LVAD evaluation. Pt signed consent form today to proceed with completing the VAD evaluation. Pt has had most tests already completed during his hospital stay.  Pt finished antibiotics on 9/6 and had surveillance cultures drawn on 9/20 which were negative.   Pt tells me that is ready to proceed with VAD implantation. He states "I can't keep living this way."  Pt states that he is SOB with any activity and has orthopnea nightly along with PND.   Pt also c/o pain in his right arm from DVT. Pt is on Eliquis 5 mg bid  Pt had pre-VAD dopplers today.   Vital Signs:  HR: 81 BP: 114/77 (89) SPO2: 99 %   Weight: 171.6 lb w/o eqt Last weight: 171 lb Home weights: 170 lbs   Symptom YES NO DETAILS  Angina  X Activity:  Claudication  X How Far:  Syncope  X When:  Stroke  X   Orthopnea X  How many pillows:  PND X  How often:  CPAP  X How many hours:  Pedal Edema  X   Abdominal Fullness  X   Nausea / Vomit X  Nauseous a couple x a week  Diaphoresis  X When:  Shortness of Breath X  Activity:walking, stairs any activity  Palpitations  X When:  ICD shock  X Pt wearing life vest 2.3 hrs a day  Bleeding S/S  X   Tea-colored Urine  X   Hospitalizations  X   Emergency Room  X   Other MD X  9/20 Lucianne [place]  Activity   Fluid   Diet       Device: removed 7/25 d/t infection  Pt has appt w/Dr. Cyndia Bent on 10/14. Pt will need to see social, (I have sent them a message), nutrition and palliative. We will plan to discuss the pt formally in [facility] after DR. Bartle sees the pt. If pt is deemed VAD candidate we will admit with RHC and VAD to follow. We will cancel his appt with  Dr. [name] in 2 weeks.   VAD evaluation has been signed today and all questions answered. Pt was informed that his sister will need to be at the social evaluation.  Patient Instructions:  No change in medications You will see Dr. Cyndia Bent on 10/14, after your visit with him the VAD coordinators will call you with scheduling details for you LVAD.  You will be hearing from our social worker to set up an appt time for social evaluation for LVAD.      Tanda Rockers, RN VAD Coordinator    Office: [phone] 24/7 Emergency VAD Pager: [phone]

## 2021-02-06 NOTE — Telephone Encounter (Signed)
CSW consulted to complete social evaluation for possible LVAD implantation.  Spoke with pt who is in agreement to coming into the clinic for assessment- will speak to his projected caregiver, his sister Brandon [name], and they will discuss their availability and get back to CSW with times they can meet.  Will continue to follow and assist as needed  Brandon [name], [facility] Desk#: [phone] Cell#: [phone]

## 2021-02-06 NOTE — Progress Notes (Signed)
[facility] Note Date:  02/06/2021   ID:  Brandon [name], DOB [birth year], MRN [id number]  Location: Home  Provider location: [facility] Type of Visit: Established patient  PCP:  [name], MD  Cardiologist:  [name], MD Primary HF: Elanora Quin  Chief Complaint: Post hospitalization heart failure follow-up   History of Present Illness:  Brandon [name] is a 65 y.o.smoker (originally from [place], [place]) with h/o CAD s/p previous MI, HTN, HL.   He was admitted 04/27/19 with anterior ST elevation. Taken to cath lab which showed chronically occluded RCA (L>>R collaterals) and thrombotic occlusion of proximal LAD. Underwent PCI of LAD. Echo w/ reduced LVEF 30-35% w/ apical aneurysm. RV ok. Post cath, he required milrinone for cardiogenic shock.   Now s/p Barostim.  Echo 6/22 F 20-25% RV mildly HK. Moderate to severe low-flow AS DI 0.34. Referred for TAVR evaluation. PFTs ordered.  Underwent outpatient [facility] 11/29/20 as part of VAD w/u. He had low filling pressures and CI marginal at 2.3. Post procedure, developed severe rigors, n/v and fever to 102. He was given IVF and started on broad spectrum abx. He was admitted to ICU for suspected sepsis where he then developed acute hypoxic respiratory failure, requiring intubation. Blood Cx + strep. 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  Likely low-flow low gradient AS. Mean gradient 18, RV mild HK. He required inotropic support and eventually  weaned off. Hospitalization also c/b RUE DVT and aspiration pneumonia. He underwent ICD extraction 12/04/20, but Barostim was left as it is extravascular. Per ID, he will complete 6 weeks of IV ceftriaxone. GDMT was limited by hypotension (Coreg and Entresto stopped) and need for midodrine. Home on lasix 40 mg on M/F. He was discharged with LifeVest, PICC & HH PT/RN. D/c weight 184 lbs.  Here for f/u. Was in  the ER lrecentlywith volume overload after dropping lasix dose from 40 to 20. Responded well to IV lasix in ER. Finished IV abx on Tuesday 9/6. PICC line removed. No f/c. Had up with Dr. [name] on 9/20. Surveillance cultures drawn which were negative.  Here for f/u. Says he is feeling some better. Appetite improved.and able to gain some weight back. Remains SOB with minimal activity. No CP. Edema well controlled, No orthopnea or PND. Ready to move forward with VAD placement.    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 Mean gradient 18. VR = 0.19 VTI = 7.5. RV mild HK  - R/LHC (11/29/20):    Prox Cx lesion is 50% stenosed.   Prox RCA to Mid RCA lesion is 100% stenosed.   Ost RCA to Prox RCA lesion is 50% stenosed.   Non-stenotic Mid LAD lesion was previously treated.   Findings:   Ao = 81/50 (63) LV = 99/12 RA = 4 RV = 24/6 PA = 19/5 (14) PCW = 3 Fick cardiac output/index = 4.7/2.3 PVR = 2.3 WU Ao sat = 99% PA sat = 69%, 70%   Assessment: Stable 2v CAD Severe iCM EF 20% Low filling pressures with moderately reduced cardiac output Heavily calcified aortic valve (difficult to cross). Appears to be moderate by pressures but may have component of low-gradient, low-flow AS  - Echo (11/01/20): EF 20-25% RV mildly HK. Moderate to severe low-flow AS DI 0.34 Aortic valve mean gradient measures 14.3 mmHg. Aortic valve  peak gradient measures 24.3 mmHg. Aortic valve area, by VTI measures 1.44 cm.  - Echo (05/19/20): EF 20-25% RV mildly HK. moderate AS  Mean gradient 13 AVA 1.2 cm2DI 0.30   - Echo (04/27/19): EF 30-35% RV normal.  Moderate HK LV. Grade IDD.   - Echo (10/29/19): EF 25-30% RV ok. Personally reviewed  Past Medical History:  Diagnosis Date   AICD (automatic cardioverter/defibrillator) present 08/31/2019   Ankylosing spondylitis ([place])    Arthritis    BENIGN PROSTATIC  HYPERTROPHY 06/07/2008   CHF (congestive heart failure) ([place])    COLONIC POLYPS, HX OF 06/07/2008   Depression    H/O hiatal hernia    Heart failure ([place])    HYPERLIPIDEMIA 06/07/2008   HYPERTENSION 06/07/2008   Myocardial infarction [facility]) 2005   NSTEMI, s/p LAD stent   NEPHROLITHIASIS, HX OF 06/07/2008   STEMI (ST elevation myocardial infarction) ([place]) 04/27/2019   Past Surgical History:  Procedure Laterality Date   COLONOSCOPY WITH PROPOFOL N/A 04/24/2020   Procedure: COLONOSCOPY WITH PROPOFOL;  Surgeon: [name], MD;  Location: [facility];  Service: Gastroenterology;  Laterality: N/A;   CORONARY ANGIOPLASTY WITH STENT PLACEMENT  2005   LAD stent, jailed diagonal   CORONARY/GRAFT ACUTE MI REVASCULARIZATION N/A 04/27/2019   Procedure: Coronary/Graft Acute MI Revascularization;  Surgeon: [name], MD;  Location: [facility];  Service: Cardiovascular;  Laterality: N/A;   ICD IMPLANT  08/31/2019   ICD IMPLANT N/A 08/31/2019   Procedure: ICD IMPLANT;  Surgeon: [name], MD;  Location: [place] [facility];  Service: Cardiovascular;  Laterality: N/A;   ICD LEAD REMOVAL N/A 12/04/2020   Procedure: ICD SYSTEM EXTRACTION;  Surgeon: Vickie Epley, MD;  Location: [facility];  Service: Cardiovascular;  Laterality: N/A;   LAMINECTOMY     lumbar   LEFT HEART CATH AND CORONARY ANGIOGRAPHY N/A 04/27/2019   Procedure: LEFT HEART CATH AND CORONARY ANGIOGRAPHY;  Surgeon: [name], MD;  Location: [facility];  Service: Cardiovascular;  Laterality: N/A;   LUMBAR FUSION  01/2012   POLYPECTOMY  04/24/2020   Procedure: POLYPECTOMY;  Surgeon: [name], MD;  Location: [facility];  Service: Gastroenterology;;   RIGHT HEART CATH N/A 04/27/2019   Procedure: RIGHT HEART CATH;  Surgeon: [name], Mekai M, MD;  Location: [facility];  Service: Cardiovascular;  Laterality: N/A;   RIGHT/LEFT HEART CATH AND CORONARY ANGIOGRAPHY N/A 11/29/2020    Procedure: RIGHT/LEFT HEART CATH AND CORONARY ANGIOGRAPHY;  Surgeon: Jolaine Artist, MD;  Location: [facility];  Service: Cardiovascular;  Laterality: N/A;   TEE WITHOUT CARDIOVERSION N/A 12/04/2020   Procedure: TRANSESOPHAGEAL ECHOCARDIOGRAM (TEE);  Surgeon: Vickie Epley, MD;  Location: [facility] OR;  Service: Cardiovascular;  Laterality: N/A;   TONSILLECTOMY     warthin tumor removal Left 2014   Current Outpatient Medications  Medication Sig Dispense Refill   acetaminophen (TYLENOL) 500 MG tablet Take 1,000 mg by mouth every 6 (six) hours as needed (pain.).     apixaban (ELIQUIS) 5 MG TABS tablet Take 1 tablet (5 mg total) by mouth 2 (two) times daily. 60 tablet 6   atorvastatin (LIPITOR) 80 MG tablet TAKE 1 TABLET BY MOUTH ONCE DAILY AT 6PM. 90 tablet 3   Carboxymethylcellulose Sodium (ARTIFICIAL TEARS OP) Place 1 drop into both eyes 3 (three) times daily as needed (for dryness or irritation).     dapagliflozin propanediol (FARXIGA) 10 MG TABS tablet Take 1 tablet (10 mg total)  by mouth in the morning. 30 tablet 6   digoxin (LANOXIN) 0.125 MG tablet Take 1 tablet (0.125 mg total) by mouth daily. 30 tablet 6   diphenhydrAMINE (BENADRYL) 25 MG tablet Take 50 mg by mouth at bedtime.     eplerenone (INSPRA) 50 MG tablet Take 0.5 tablets (25 mg total) by mouth in the morning. 30 tablet 6   ezetimibe (ZETIA) 10 MG tablet TAKE ONE TABLET BY MOUTH DAILY 90 tablet 3   furosemide (LASIX) 40 MG tablet Take 1 tablet (40 mg total) by mouth daily. Take extra tab on Monday and Friday 40 tablet 3   gabapentin (NEURONTIN) 100 MG capsule Take 1 capsule (100 mg total) by mouth 4 (four) times daily. 120 capsule 3   ivabradine (CORLANOR) 7.5 MG TABS tablet Take 1 tablet (7.5 mg total) by mouth 2 (two) times daily with a meal. 60 tablet 6   melatonin 5 MG TABS Take 1 tablet (5 mg total) by mouth at bedtime. 30 tablet 6   pantoprazole (PROTONIX) 40 MG tablet Take 1 tablet (40 mg total) by mouth daily. 30  tablet 6   potassium chloride SA (KLOR-CON) 20 MEQ tablet Take 1 tablet (20 mEq total) by mouth daily. 30 tablet 6   sertraline (ZOLOFT) 25 MG tablet Take 50 mg by mouth daily.     tamsulosin (FLOMAX) 0.4 MG CAPS capsule TAKE 1 CAPSULE(0.4 MG) BY MOUTH DAILY 90 capsule 1   REMICADE 100 MG injection Inject into the vein every 6 (six) weeks. (Patient not taking: Reported on 02/06/2021)     No current facility-administered medications for this encounter.   Allergies:   Sertraline   Social History:  The patient  reports that he quit smoking about 18 months ago. His smoking use included cigarettes. He has a 40.00 pack-year smoking history. He has never used smokeless tobacco. He reports current alcohol use of about 7.0 standard drinks per week. He reports current drug use.   Family History:  The patient's family history includes Arthritis in his sister; Depression in his father; Heart failure in his mother.   ROS:  Please see the history of present illness.   All other systems are personally reviewed and negative.   Recent Labs: 03/23/2020: NT-Pro BNP 520 12/06/2020: Magnesium 1.9 01/18/2021: B Natriuretic Peptide 607.9 02/06/2021: ALT 19; BUN 17; Creatinine, Ser 0.74; Hemoglobin 12.6; Platelets 316; Potassium 3.6; Sodium 136  Personally reviewed   Wt Readings from Last 3 Encounters:  02/06/21 77.8 kg (171 lb 9.6 oz)  01/30/21 77.8 kg (171 lb 9.6 oz)  01/18/21 77.7 kg (171 lb 3.2 oz)   BP 114/77   Pulse 81   Wt 77.8 kg (171 lb 9.6 oz)   SpO2 99%   BMI 23.27 kg/m    Exam:   General:  Well appearing. No resp difficulty HEENT: normal Neck: supple. no JVD. Carotids 2+ bilat; + bruits. No lymphadenopathy or thryomegaly appreciated. Cor: PMI nondisplaced. Regular rate & rhythm. 2/6 AS Lungs: clear Abdomen: soft, nontender, nondistended. No hepatosplenomegaly. No bruits or masses. Good bowel sounds. Extremities: no cyanosis, clubbing, rash, edema Neuro: alert & orientedx3, cranial nerves  grossly intact. moves all 4 extremities w/o difficulty. Affect pleasant   ASSESSMENT AND PLAN:  1. Chronic Systolic Heart Failure, ICM - Echo 12/20 EF post anterior MI 30 to 35%. - Echo 6/21 EF 25-30% RV ok - Echo 05/19/20 EF 20-25% RV mildly HK. moderate AS  Mean gradient 13 AVA 1.2 cm2 DI 0.30 - RHC  (  7/22): EF 20%, low filling pressures. CI marginal at 2.3. - NYHA IV. Volume status ok  - Continue digoxin 0.125 mg daily. - Continue Farxiga 10 mg daily - Continue eplerenone 25 mg daily.   - Continue ivabradine 7.5 mg bid,  - Off Entresto due to low BP - ICD extracted with infection. No with LifeVest - Now that bacteremia cleared will proceed with VAD w/u scheduling. He sees Dr. Cyndia Bent in about 10 days then will coordinate admission for RHC and medical optimization pre-VAD.   2. Strep Bacteremia - Blood cx + for Strep gordonii (7/22). 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  - Follows with ID. Surveillance cx negative on 922  3. CAD - s/p anterior STEMI (12/20). LHC showed chronically occluded RCA (with L>>R collaterals) and thrombotic occlusion of proximal LAD. Underwent PCI of LAD. - LHC (7/22) with stable CAD.  - Had NSTEMI in setting of sepsis. - No s/s ischemia - Continue ASA/statin.   4. Aortic Stenosis - AS is severe on TEE. - Likely low-flow low gradient severe AS. Mean gradient 18. DI = 0.19 VTI = 7.5 - Presence of fibroelastoma prevents TAVR.  5.  Hyperlipidemia: - Now followed by Dr. Debara Pickett in [facility] and 10 mg Zetia added.  - Last LDL 69   6. RUE DVT - On Eliquis. - No bleeding issues.  - Still w/ right arm pain but improving  7. Tobacco abuse - Former smoker - Remains abstinent.  8. ETOH  - He has quit drinking.    9. Anxiety - Continue zoloft  Total time spent 40 minutes. Over half that time spent discussing above.     Signed, Glori Bickers, MD  02/06/2021 3:18 PM  [facility] [place] [address] [facility] [place] [address] [phone] (office) [phone] (fax)

## 2021-02-07 ENCOUNTER — Encounter (HOSPITAL_COMMUNITY): Payer: Self-pay

## 2021-02-08 ENCOUNTER — Telehealth (HOSPITAL_COMMUNITY): Payer: Self-pay | Admitting: Licensed Clinical Social Worker

## 2021-02-08 LAB — LUPUS ANTICOAGULANT PANEL
DRVVT: 78.4 s — ABNORMAL HIGH (ref 0.0–47.0)
PTT Lupus Anticoagulant: 49.7 s (ref 0.0–51.9)

## 2021-02-08 LAB — DRVVT MIX: dRVVT Mix: 56.5 s — ABNORMAL HIGH (ref 0.0–40.4)

## 2021-02-08 LAB — DRVVT CONFIRM: dRVVT Confirm: 1.3 ratio — ABNORMAL HIGH (ref 0.8–1.2)

## 2021-02-08 NOTE — Telephone Encounter (Signed)
CSW called pt to check in regarding getting social assessment completed with pt and caregiver, Sister [name].  Was able to schedule assessment with pt for 02/15/21 at 10am  Will continue to follow and assist as needed  Jorge Ny, [place] Worker [facility] Desk#: [phone] Cell#: [phone]

## 2021-02-12 ENCOUNTER — Ambulatory Visit (HOSPITAL_COMMUNITY): Payer: Medicare Other

## 2021-02-12 LAB — FACTOR 5 LEIDEN

## 2021-02-15 ENCOUNTER — Telehealth (HOSPITAL_COMMUNITY): Payer: Self-pay | Admitting: Licensed Clinical Social Worker

## 2021-02-15 NOTE — Telephone Encounter (Signed)
LVAD Initial Psychosocial Screening  Date/Time Initiated:  02/15/21 Referral Source:  LVAD Team Referral Reason:  LVAD psychosocial screening Source of Information:  Patient/Patient sister Patty  Demographics Name:  Brandon [name] Address:  [address]  [place] [address] Home phone:  [phone] (home)    Cell: same Marital Status: single Faith:  none reported Primary Language:  English SS last 4: [id number]  DOB: 06-11-1955   Medical & Follow-up Adherence to Medical regimen/INR checks: compliant  Medication adherence: compliant  Physician/Clinic Appointment Attendance: compliant Comments:    Advance Directives: Do you have a Living Will or Medical POA? Yes - his sister [name] Would you like to complete a Living Will and Medical POA prior to surgery?  No- already has Do you have Goals of Care? Yes  Have you had a consult with the Palliative Care Team at [facility]? No  Psychological Health Appearance:  Unremarkable Mental Status:  Alert, oriented Eye Contact:  Good Thought Content:  Coherent Speech:  Logical/coherent Mood:  Appropriate  Affect:  Appropriate to circumstance Insight:  Good Judgement: Unimpaired Interaction Style:  Engaged  Family/Social Information Who lives in your home? self Name: N/A  Relationship:    Other family members/support persons in your life? Name: Patty  Relationship:  Sister Avanell Shackleton      Neighbor/friend  Caregiving Needs Who is the primary caregiver? [place] status:  Has rheumatoid arthritis but is still capable of doing ADLs and feels as if she can assist pt in the caregiving duties outlined Do you drive?  yes Do you work?  no Physical Limitations:  unable to complete very strenuous work but is independent and no assistive devices Do you have other care giving responsibilities?  no Contact [phone]  Who is the secondary caregiver? [place] status:  no concerns reported Do you drive?   yes Do you work?  retired  Physical Limitations:  no limitations reported Do you have other care giving responsibilities?  no Contact number: not provided at this time  Home Environment/Personal Care Do you have reliable phone service? Yes  If so, what is the number?  [phone] Do you own or rent your home? own Current mortgage/rent: $900/month Number of steps into the home? 1 small step How many levels in the home? 1 Assistive devices in the home? none Electrical needs for LVAD (3 prong outlets)? yes Second hand smoke exposure in the home? no Travel distance from [facility]? 12-15 minutes  Community Are you active with community agencies/resources/homecare? No Agency Name:  n/a Are you active in a church, synagogue, mosque or other faith based community? No Faith based institutions name:  n/a What other sources do you have for spiritual support? no Are you active in any clubs or social organizations? no What do you do for fun?  Hobbies?  Interests? Used to golf- would like to get back into if he is able, also likes to read and follows sports closely  Education/Work Information What is the last grade of school you completed? Some college Preferred method of learning?  Written, Verbal, and Hands on Do you have any problems with reading or writing?  No Are you currently employed?  No  When were you last employed? Was working part time up until 6 months ago but retired from full time work about a year ago  Name of employer? [employer]  Please describe the kind of work you do? Worked in an office arranging shipments  How long have  you worked there? Worked there for 20 years If you are not working, do you plan to return to work after VAD surgery? Yes- considering returning to part time work If yes, what type of employment do you hope to find? Would like to return to his previous work Are you interested in job training or learning new skills?  No Did you serve in the [employer]? No   If so, what branch? N/a  Financial Information What is your source of income? Social Security Retirement income- around $2,500/month Do you have difficulty meeting your monthly expenses? No If yes, which ones? N/a How do you cope with this? N/a Can you budget for the monthly cost for dressing supplies post procedure? Yes  Primary Health insurance:  Medicare Secondary Insurance:BCBS supplement plan  Prescription plan: Medicare part D plan What are your prescription co-pays? Less than $100/month- gets help with PAP from our patient advocate Do you use mail order for your prescriptions?  No Gets most medications from local pharmacy and a few through patient assistance programs Have you ever had to refuse medication due to cost?  No Have you applied for Medicaid?  no Have you applied for Social Security Disability (SSI)  no  Medical Information Briefly describe why you are here for evaluation: Expresses understanding that he is being evaluated for possible LVAD placement. Do you have a PCP or other medical provider? Dr. Volanda Napoleon- considered moving PCP but hasn't moved forward with this Are you able to complete your ADL's?  yes Do you have a history of trauma, physical, emotional, or sexual abuse? no Do you have any family history of heart problems? States his mom had cardiac issues but after she was on treatment for cancer so unsure if it was genetic.  Sister reports HTN. Do you smoke now or past usage? past usage    Quit date: 2 years ago in december Do you drink alcohol now or past usage? past usage    Quit date:  6 months ago- does not feel as if he ever drank in excess Are you currently using illegal drugs or misuse of medication or past usage? past usage - used to use cocaine and marijuana recreationally but has not used cocain since he was in his 65s Have you ever been treated for substance abuse? Yes      If yes, where and when did you receive treatment? Received outpatient treatment over  30 years ago  Mental Health History How have you been feeling in the past year? Has felt somewhat depressed over the last year due to his medical conditions and not feeling well or being able to do things he would be able to normally. Have you ever had any problems with depression, anxiety or other mental health issues? Currently dealing with some depression and anxiety over diagnosis. Do you see a counselor, psychiatrist or therapist?  no If you are currently experiencing problems are you interested in talking with a professional? No Have you or are you taking medications for anxiety/depression or any mental health concerns?  Yes  Current Medications: Zoloft- has been taking about 3 months- does not feel as if it is helping What are your coping strategies under stressful situations? Does a lot of self talk and tries to have positive outlook on his situation/acknowledge his blessings compared to others.  Works to accept what he can't change and move forward. Are there any other stressors in your life?  no Have you had any past or current thoughts  of suicide? no How many hours do you sleep at night? 6 but states this is often interrupted every hour when he wakes up due to arm pain How is your appetite? Reports low appetite- eats the same number of meals everyday but just less food. Would you be interested in attending the LVAD support group? Yes thinks this could potentially be beneficial for him if he proceeds with VAD.  PHQ2 Depression Scale: 3 PHQ9 Depression scale (if positive PHQ2 screen):     Legal Do you currently have any legal issues/problems?  no Have you had any legal issues/problems in the past?  no Do you have a Durable POA?  Yes Name of [place]? Patty   Plan for VAD Implementation Do you know and understand what happens during the VAD surgery? Patient Verbalizes Understanding  of surgery and able to describe details What do you know about the risks and side effect associated with  VAD surgery? Patient Verbalizes Understanding  of risks (infection, stroke and death) Explain what will happen right after surgery: Patient Verbalizes Understanding  of OR to ICU and will be intubated What is your plan for transportation for the first 8 weeks post-surgery? (Patients are not recommended to drive post-surgery for 8 weeks)  Driver: [name] Do you have airbags in your vehicle?  There is a risk of discharging the device if the airbag were to deploy. Yes- acknowledges risks and recommendation to sit in the back seat of the car. What do you know about your diet post-surgery? Patient Verbalizes Understanding  of Heart healthy How do you plan to monitor your medications, current and future?  Uses a pill box currently and would plan to continue with this.  How do you plan to complete ADL's post-surgery?  Hopeful to be able to complete most himselves but will rely on caregivers when needed. Will it be difficult to ask for help from your caregivers?  No- has close relationship with his sister and feels comfortable asking her for assistance.  Please explain what you hope will be improved about your life as a result of receiving the LVAD? Reports having a goal of potentially returning to part time work feel an improvement in how he feels and be able to get back to living "normally". Please tell me your biggest concern or fear about living with the LVAD?  Concerned about getting the LVAD and it not leading to meaningful increase in qualify of life or significant increase in length of his life.  Big concern is that he already suffers from arthritis that he cannot take affective pain meds with his heart- worried he will get the VAD then have all the equipment he has to carry which would exacerbate his pain and lead to poor quality of life especially if he still isn't able to take affective medication to manage symptoms. How do you cope with your concerns and fears?  Trying to weight the pros and cons and  continue to ask questions to get all the information to make the decision that is best for him. Please explain your understanding of how their body will change?  Understands he will have driveline and sternal wound  Are you worried about these changes? Is not worried about his change in appearance- does not think this will bother him. Do you see any barriers to your surgery or follow-up? no  Understanding of LVAD Patient states understanding of the following: Surgical procedures and risks, Electrical need for LVAD (3 prong outlets), Safety precautions with LVAD (  water, etc.), LVAD daily self-care (dressing changes, computer check, extra supplies), Outpatient follow up (LVAD clinic appts, monitoring blood thinners), and Need for Emergency Planning  Discussed and Reviewed with Patient and Caregiver  Patient's current level of motivation to prepare for LVAD: states he is currently about a 5 out of 10.  Hopeful for transplant evaluation to go well but acknowledges that he likely won't be approved.  States his motivation will change based on further answers to his questions.  Would be motivated to have LVAD opposed to imminent death if he feels as if he will have better qualify of life. Patient's present Level of Consent for LVAD: dependent on answers to his questions/transplant evaluation    Education provided to patient/family/caregiver:   Caregiver role and responsibiltiy, Financial planning for LVAD, Role of Clinical Social Worker, and Signs of Depression and Anxiety    Discussed and Reviewed with Patient and Caregiver  Caregiver questions Please explain what you hope will be improved about your life and loved one's life as a result of receiving the LVAD?  Hopes that the patient will be able to go back to work and start being more social again- be able to more easily get together with friends and feel like socializing. What is your biggest concern or fear about caregiving with an LVAD patient?   States she has no concerns- feels comfortable with the caregiving requirements detailed for her. What is your plan for availability to provide care 24/7 x2 weeks post op and dressing changes ongoing?  Will plan to be available most of this time but will work in coordination with pt neighbor and other siblings who are planning to come in from out of town to assist. Who is the relief/backup caregiver and what is their availability?  [name]- is readily available as she is retired. Preferred method of learning? Verbal and Hands on  Do you drive? yes How do you handle stressful situations?  Does yoga and talks things over with friends Do you think you can do this? Yes- 100% Is there anything that concerns about caregiving?  no Do you provide caregiving to anyone else?  no  Caregiver's current level of motivation to prepare for LVAD: is 100% on board with LVAD if the patient is Caregiver's present level of consent for LVAD: similarly gives 100% consent and support if the patient is on board.  Clinical Interventions Needed:     CSW will monitor signs and symptoms of depression and assist with adjustment to life with an LVAD. CSW encouraged attendance with the LVAD Support Group to assist further with adjustment and post implant peer support.  Clinical Impressions/Recommendations:    Mr. Douthat is a 65 year old male who lives alone but reports strong family and friend support.  He arrived for his interview promptly and returned communication prior to interview promptly.  Throughout the interview he appeared alert and oriented and answered all questions thoroughly and appropriately.  Mr. 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  Reports he owns his home but still pays a mortgage.  Only source of income at this time is retirement income but states this covers  all of his expenses and he has no financial concerns at this time.  Mr. Olliff has a history of compliance with medical regimen, medication adherence, and appointment attendence.  Reports that  he has good insurance coverage and is not worried about paying for any current or future medical expenses.  Mr. Bard is not active in any community organizations but states he has a large amount of friends and family who support him.  He used to engage in golf for fun but has stopped with his health concerns- he continues to read and follow sports closely for fun. He does endorse some feelings of depression which he feels are appropriate to his situation.  He scored a 3 on his PHQ-2 reports that he feels somewhat depressed more than half the days recently and sometimes doesn't feel like doing anything because of these feelings- will plan to reassess with PHQ-9 following procedure.  He usually engages in self talk to deal with these feelings and finds this is usually affective.  He also started zoloft to help with feelings of depression but has not found this to be effective- is unsure if he would want to talk to a professional about these feelings but is willing to consider coming to LVAD support group if he proceeds with the procedure.  Mr. Maul was able to verbalize good understanding of LVAD procedure and the subsequent hospital stay.  Understands how his life will change following the procedure and how his body will change.  Expresses concerns with uncertainty of if this will meaningfully extend his life or improve his qualify of life- wishes there was more certainty of this but understands there are inherent risks with this extensive of a procedure.  Expresses that his goal of getting the procedure would be to improve how he feels and be able to get back to living a more normal life including possibly returning to work.    Pts sister, [name], was also in attendance for the interview and is the agreed upon primary  caregiver for the patient if he gets the LVAD implanted.  Patty lives locally and is retired so would be available to assist pt as needed throughout the day.  Though Patty has some health concerns (arthritis) which somewhat limits what she can do physically she feels very comfortable that she can perform the caregiver requirements as they were explained to her.  She has reliable transportation and is able to drive the patient to and from appts and does not have any other caregiving requirements- has a husband but he is completely independent.  Mr. Klugh also has a very supportive neighbor who he identifies as his likely secondary caregiver- she is also retired and able to assist as needed.  The patient and his caregiver have a good understanding of the risks and benefits of the LVAD procedure.  He remains unsure about proceeding at this time until he gets a better picture of potential influence on his quality of life- he is particularly worried that the equipment will add to his chronic pain that he already experiences with his back. He will plan to further discuss these concerns with the physicians so he can make an informed decision.  If he were to proceed with the procedure I did not identify any concerns with home environment or support which would make it hard for him to be successful.  Jorge Ny, LCSW Clinical Social Worker [facility] Desk#: [phone] Cell#: [phone]

## 2021-02-21 ENCOUNTER — Encounter (HOSPITAL_COMMUNITY): Payer: Medicare Other | Admitting: Internal Medicine

## 2021-02-23 ENCOUNTER — Encounter: Payer: Self-pay | Admitting: Surgery

## 2021-02-23 ENCOUNTER — Other Ambulatory Visit: Payer: Self-pay

## 2021-02-23 ENCOUNTER — Institutional Professional Consult (permissible substitution) (INDEPENDENT_AMBULATORY_CARE_PROVIDER_SITE_OTHER): Payer: Medicare Other | Admitting: Surgery

## 2021-02-23 VITALS — BP 112/72 | HR 82 | Resp 20 | Ht 72.0 in | Wt 172.0 lb

## 2021-02-23 DIAGNOSIS — I255 Ischemic cardiomyopathy: Secondary | ICD-10-CM | POA: Diagnosis not present

## 2021-02-23 DIAGNOSIS — I5023 Acute on chronic systolic (congestive) heart failure: Secondary | ICD-10-CM

## 2021-02-23 NOTE — Progress Notes (Deleted)
Cardiothoracic Surgery Consultation  PCP is [name], MD Referring Provider is Bensimhon, Shaune Pascal, MD  Chief Complaint  Patient presents with   Consult    Initial surgical consult, LVAD, review work up    HPI:  The patient is a 65 year old gentleman with history of hypertension, hyperlipidemia, ankylosing spondylitis on chronic treatment with Remicade, coronary artery disease status post anterior STEMI in 05/930, chronic systolic heart failure due to ischemic cardiomyopathy with ejection fraction about 20%, and moderate aortic stenosis.  He was admitted on 04/27/2019 with an anterior STEMI and catheterization showed a chronically occluded RCA with left-to-right collaterals as well as thrombotic occlusion of the proximal LAD.  He underwent PCI of the LAD.  Post-cath he required milrinone for cardiogenic shock.  An echocardiogram showed an ejection fraction of 30 to 35% with apical aneurysm.  An echo in June 2022 showed an EF of 20 to 25% with a mildly hypokinetic right ventricle.  There was felt to be moderate to severe low-flow/low gradient aortic stenosis.  The mean gradient across aortic valve was 14.3 mmHg with an aortic valve area measured by VTI of 1.44 cm.  He underwent outpatient R/LHC on 11/29/2020 as part of his fat evaluation.  He had low filling pressures with a cardiac index of 2.3.  Post procedure he developed severe rigors and a temperature to 102 with nausea and vomiting.  He was treated with IV fluid and started on broad-spectrum antibiotics for suspected sepsis.  He developed acute hypoxic respiratory failure requiring intubation.  Blood cultures were positive for strep.  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  The valve was severely calcified with restricted mobility.  The mean gradient was 18 mmHg and was felt to be consistent with low-flow/low gradient  aortic stenosis.  There is mild right ventricular hypokinesis.  He required inotropic support but was weaned off.  He also developed a right upper extremity DVT and aspiration pneumonia.  He underwent ICD extraction on 12/04/2020 but the Bero stem was left in place.  He was discharged with a LifeVest and PICC line for 6 weeks of IV ceftriaxone.  He completed antibiotics on 01/16/2021 and saw infectious disease for repeat blood cultures on 01/30/2021 which were negative.  He has continued to be followed by Dr. [name] in the heart failure clinic.  R/LHC on 11/29/2020 showed    Prox Cx lesion is 50% stenosed.   Prox RCA to Mid RCA lesion is 100% stenosed.   Ost RCA to Prox RCA lesion is 50% stenosed.   Non-stenotic Mid LAD lesion was previously treated.   Findings:  Ao = 81/50 (63) LV = 99/12 RA = 4 RV = 24/6 PA = 19/5 (14) PCW = 3 Fick cardiac output/index = 4.7/2.3 PVR = 2.3 WU Ao sat = 99% PA sat = 69%, 70%  Assessment: Stable 2v CAD Severe iCM EF 20% Low filling pressures with moderately reduced cardiac output Heavily calcified aortic valve (difficult to cross). Appears to be moderate by pressures but may have component of low-gradient, low-flow AS    He is here today with his sister.  He reports fatigue at baseline.  He has shortness of breath with activity and orthopnea.  He has been eating fairly well and maintaining his weight.  He denies any fever or chills.  He denies any chest pain or pressure.  He has had no  peripheral edema.  He continues to have some numbness and pain along the ulnar aspect of his right arm into the fourth and fifth finger.  He said this is improved from when it started but still significant.  Past Medical History:  Diagnosis Date   AICD (automatic cardioverter/defibrillator) present 08/31/2019   Ankylosing spondylitis ([place])    Arthritis    BENIGN PROSTATIC HYPERTROPHY 06/07/2008   CHF (congestive heart failure) ([place])    COLONIC POLYPS, HX OF 06/07/2008    Depression    H/O hiatal hernia    Heart failure ([place])    HYPERLIPIDEMIA 06/07/2008   HYPERTENSION 06/07/2008   Myocardial infarction [facility]) 2005   NSTEMI, s/p LAD stent   NEPHROLITHIASIS, HX OF 06/07/2008   STEMI (ST elevation myocardial infarction) ([place]) 04/27/2019    Past Surgical History:  Procedure Laterality Date   COLONOSCOPY WITH PROPOFOL N/A 04/24/2020   Procedure: COLONOSCOPY WITH PROPOFOL;  Surgeon: [name], MD;  Location: [facility];  Service: Gastroenterology;  Laterality: N/A;   CORONARY ANGIOPLASTY WITH STENT PLACEMENT  2005   LAD stent, jailed diagonal   CORONARY/GRAFT ACUTE MI REVASCULARIZATION N/A 04/27/2019   Procedure: Coronary/Graft Acute MI Revascularization;  Surgeon: [name], MD;  Location: [place] [facility];  Service: Cardiovascular;  Laterality: N/A;   ICD IMPLANT  08/31/2019   ICD IMPLANT N/A 08/31/2019   Procedure: ICD IMPLANT;  Surgeon: [name], MD;  Location: [place] [facility];  Service: Cardiovascular;  Laterality: N/A;   ICD LEAD REMOVAL N/A 12/04/2020   Procedure: ICD SYSTEM EXTRACTION;  Surgeon: Vickie Epley, MD;  Location: [place];  Service: Cardiovascular;  Laterality: N/A;   LAMINECTOMY     lumbar   LEFT HEART CATH AND CORONARY ANGIOGRAPHY N/A 04/27/2019   Procedure: LEFT HEART CATH AND CORONARY ANGIOGRAPHY;  Surgeon: [name], MD;  Location: [place] [facility];  Service: Cardiovascular;  Laterality: N/A;   LUMBAR FUSION  01/2012   POLYPECTOMY  04/24/2020   Procedure: POLYPECTOMY;  Surgeon: [name], MD;  Location: [facility];  Service: Gastroenterology;;   RIGHT HEART CATH N/A 04/27/2019   Procedure: RIGHT HEART CATH;  Surgeon: [name], Malak M, MD;  Location: [place] [facility];  Service: Cardiovascular;  Laterality: N/A;   RIGHT/LEFT HEART CATH AND CORONARY ANGIOGRAPHY N/A 11/29/2020   Procedure: RIGHT/LEFT HEART CATH AND CORONARY ANGIOGRAPHY;  Surgeon: Jolaine Artist, MD;   Location: [place] [facility];  Service: Cardiovascular;  Laterality: N/A;   TEE WITHOUT CARDIOVERSION N/A 12/04/2020   Procedure: TRANSESOPHAGEAL ECHOCARDIOGRAM (TEE);  Surgeon: Vickie Epley, MD;  Location[facility] OR;  Service: Cardiovascular;  Laterality: N/A;   TONSILLECTOMY     warthin tumor removal Left 2014    Family History  Problem Relation Age of Onset   Depression Father    Arthritis Sister        RA   Heart failure Mother    Other Neg Hx    Colon cancer Neg Hx    Esophageal cancer Neg Hx    Rectal cancer Neg Hx    Stomach cancer Neg Hx    Colon polyps Neg Hx     Social History Social History   Tobacco Use   Smoking status: Former    Packs/day: 1.00    Years: 40.00    Pack years: 40.00    Types: Cigarettes    Quit date: 07/26/2019    Years since quitting: 1.5   Smokeless tobacco: Never  Tobacco comments:    1-1.5 pks per year x 40-45 yrs  Vaping Use   Vaping Use: Never used  Substance Use Topics   Alcohol use: Yes    Alcohol/week: 7.0 standard drinks    Types: 7 Shots of liquor per week    Comment: "2 vodka tonics a night"   Drug use: Yes    Comment: occasionally - CBD oil    Current Outpatient Medications  Medication Sig Dispense Refill   acetaminophen (TYLENOL) 500 MG tablet Take 1,000 mg by mouth every 6 (six) hours as needed (pain.).     apixaban (ELIQUIS) 5 MG TABS tablet Take 1 tablet (5 mg total) by mouth 2 (two) times daily. 60 tablet 6   atorvastatin (LIPITOR) 80 MG tablet TAKE 1 TABLET BY MOUTH ONCE DAILY AT 6PM. 90 tablet 3   Carboxymethylcellulose Sodium (ARTIFICIAL TEARS OP) Place 1 drop into both eyes 3 (three) times daily as needed (for dryness or irritation).     dapagliflozin propanediol (FARXIGA) 10 MG TABS tablet Take 1 tablet (10 mg total) by mouth in the morning. 30 tablet 6   digoxin (LANOXIN) 0.125 MG tablet Take 1 tablet (0.125 mg total) by mouth daily. 30 tablet 6   diphenhydrAMINE (BENADRYL) 25 MG tablet Take 50 mg by mouth at  bedtime.     eplerenone (INSPRA) 50 MG tablet Take 0.5 tablets (25 mg total) by mouth in the morning. 30 tablet 6   ezetimibe (ZETIA) 10 MG tablet TAKE ONE TABLET BY MOUTH DAILY 90 tablet 3   furosemide (LASIX) 40 MG tablet Take 1 tablet (40 mg total) by mouth daily. Take extra tab on Monday and Friday 40 tablet 3   gabapentin (NEURONTIN) 100 MG capsule Take 1 capsule (100 mg total) by mouth 4 (four) times daily. 120 capsule 3   ivabradine (CORLANOR) 7.5 MG TABS tablet Take 1 tablet (7.5 mg total) by mouth 2 (two) times daily with a meal. 60 tablet 6   melatonin 5 MG TABS Take 1 tablet (5 mg total) by mouth at bedtime. 30 tablet 6   pantoprazole (PROTONIX) 40 MG tablet Take 1 tablet (40 mg total) by mouth daily. 30 tablet 6   potassium chloride SA (KLOR-CON) 20 MEQ tablet Take 1 tablet (20 mEq total) by mouth daily. 30 tablet 6   REMICADE 100 MG injection Inject into the vein every 6 (six) weeks.     sertraline (ZOLOFT) 25 MG tablet Take 50 mg by mouth daily.     tamsulosin (FLOMAX) 0.4 MG CAPS capsule TAKE 1 CAPSULE(0.4 MG) BY MOUTH DAILY 90 capsule 1   No current facility-administered medications for this visit.    Allergies  Allergen Reactions   Sertraline Other (See Comments)    Made the patient start to feel suicidal

## 2021-02-23 NOTE — Progress Notes (Signed)
PCP is [name], MD Referring Provider is Bensimhon, Shaune Pascal, MD  Chief Complaint  Patient presents with   Consult    Initial surgical consult, LVAD, review work up    HPI:  The patient is a 65 year old gentleman with history of hypertension, hyperlipidemia, ankylosing spondylitis on chronic treatment with Remicade, coronary artery disease status post anterior STEMI in 01/4708, chronic systolic heart failure due to ischemic cardiomyopathy with ejection fraction of about 20%, and moderate aortic stenosis.  He was admitted on 04/27/2019 with an anterior STEMI and catheterization showed a chronically occluded RCA with left-to-right collaterals as well as thrombotic occlusion of the proximal LAD.  He underwent PCI of the LAD.  Post-cath he required milrinone for cardiogenic shock.  An echocardiogram showed an ejection fraction of 30 to 35% with apical aneurysm.  An echo in June 2022 showed an EF of 20 to 25% with a mildly hypokinetic right ventricle.  There was felt to be moderate to severe low-flow/low gradient aortic stenosis.  The mean gradient across aortic valve was 14.3 mmHg with an aortic valve area measured by VTI of 1.44 cm.  He underwent outpatient R/LHC on 11/29/2020 as part of his LVAD evaluation.  He had low filling pressures with a cardiac index of 2.3.  Post procedure he developed severe rigors and a temperature to 102 with nausea and vomiting.  He was treated with IV fluid and started on broad-spectrum antibiotics for suspected sepsis.  He developed acute hypoxic respiratory failure requiring intubation.  Blood cultures were positive for strep gordonii.  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  The valve was severely calcified with restricted mobility.  The mean gradient was 18 mmHg and was felt to be consistent with low-flow/low gradient aortic stenosis.  There is  mild right ventricular hypokinesis.  He required inotropic support but was weaned off.  He also developed a right upper extremity DVT and aspiration pneumonia.  He underwent ICD extraction on 12/04/2020 but the Barostim was left in place.  He was discharged with a LifeVest and PICC line for 6 weeks of IV ceftriaxone.  He completed antibiotics on 01/16/2021 and saw infectious disease for repeat blood cultures on 01/30/2021 which were negative.  He has continued to be followed by Dr. [name] in the heart failure clinic.  R/LHC on 11/29/2020 showed    Prox Cx lesion is 50% stenosed.   Prox RCA to Mid RCA lesion is 100% stenosed.   Ost RCA to Prox RCA lesion is 50% stenosed.   Non-stenotic Mid LAD lesion was previously treated.   Findings:  Ao = 81/50 (63) LV = 99/12 RA = 4 RV = 24/6 PA = 19/5 (14) PCW = 3 Fick cardiac output/index = 4.7/2.3 PVR = 2.3 WU Ao sat = 99% PA sat = 69%, 70%  Assessment: Stable 2v CAD Severe iCM EF 20% Low filling pressures with moderately reduced cardiac output Heavily calcified aortic valve (difficult to cross). Appears to be moderate by pressures but may have component of low-gradient, low-flow AS    He is here today with his sister.  He reports fatigue at baseline.  He has shortness of breath with activity and orthopnea.  He has been eating fairly well and maintaining his weight.  He denies any fever or chills.  He denies any chest pain or pressure.  He has had no peripheral edema.  He  continues to have some numbness and pain along the ulnar aspect of his right arm into the fourth and fifth finger.  He said this is improved from when it started but is still significant.   Past Medical History:  Diagnosis Date   AICD (automatic cardioverter/defibrillator) present 08/31/2019   Ankylosing spondylitis ([place])    Arthritis    BENIGN PROSTATIC HYPERTROPHY 06/07/2008   CHF (congestive heart failure) ([place])    COLONIC POLYPS, HX OF 06/07/2008   Depression    H/O  hiatal hernia    Heart failure ([place])    HYPERLIPIDEMIA 06/07/2008   HYPERTENSION 06/07/2008   Myocardial infarction [facility]) 2005   NSTEMI, s/p LAD stent   NEPHROLITHIASIS, HX OF 06/07/2008   STEMI (ST elevation myocardial infarction) ([place]) 04/27/2019    Past Surgical History:  Procedure Laterality Date   COLONOSCOPY WITH PROPOFOL N/A 04/24/2020   Procedure: COLONOSCOPY WITH PROPOFOL;  Surgeon: [name], MD;  Location: [facility];  Service: Gastroenterology;  Laterality: N/A;   CORONARY ANGIOPLASTY WITH STENT PLACEMENT  2005   LAD stent, jailed diagonal   CORONARY/GRAFT ACUTE MI REVASCULARIZATION N/A 04/27/2019   Procedure: Coronary/Graft Acute MI Revascularization;  Surgeon: [name], MD;  Location: [place] [facility];  Service: Cardiovascular;  Laterality: N/A;   ICD IMPLANT  08/31/2019   ICD IMPLANT N/A 08/31/2019   Procedure: ICD IMPLANT;  Surgeon: [name], MD;  Location: [place] [facility];  Service: Cardiovascular;  Laterality: N/A;   ICD LEAD REMOVAL N/A 12/04/2020   Procedure: ICD SYSTEM EXTRACTION;  Surgeon: Vickie Epley, MD;  Location: [facility];  Service: Cardiovascular;  Laterality: N/A;   LAMINECTOMY     lumbar   LEFT HEART CATH AND CORONARY ANGIOGRAPHY N/A 04/27/2019   Procedure: LEFT HEART CATH AND CORONARY ANGIOGRAPHY;  Surgeon: [name], MD;  Location: [place] [facility];  Service: Cardiovascular;  Laterality: N/A;   LUMBAR FUSION  01/2012   POLYPECTOMY  04/24/2020   Procedure: POLYPECTOMY;  Surgeon: [name], MD;  Location: [facility];  Service: Gastroenterology;;   RIGHT HEART CATH N/A 04/27/2019   Procedure: RIGHT HEART CATH;  Surgeon: [name], Jamarcus M, MD;  Location: [place] [facility];  Service: Cardiovascular;  Laterality: N/A;   RIGHT/LEFT HEART CATH AND CORONARY ANGIOGRAPHY N/A 11/29/2020   Procedure: RIGHT/LEFT HEART CATH AND CORONARY ANGIOGRAPHY;  Surgeon: Jolaine Artist, MD;  Location: [place] [facility]  LAB;  Service: Cardiovascular;  Laterality: N/A;   TEE WITHOUT CARDIOVERSION N/A 12/04/2020   Procedure: TRANSESOPHAGEAL ECHOCARDIOGRAM (TEE);  Surgeon: Vickie Epley, MD;  Location: [facility] OR;  Service: Cardiovascular;  Laterality: N/A;   TONSILLECTOMY     warthin tumor removal Left 2014    Family History  Problem Relation Age of Onset   Depression Father    Arthritis Sister        RA   Heart failure Mother    Other Neg Hx    Colon cancer Neg Hx    Esophageal cancer Neg Hx    Rectal cancer Neg Hx    Stomach cancer Neg Hx    Colon polyps Neg Hx     Social History Social History   Tobacco Use   Smoking status: Former    Packs/day: 1.00    Years: 40.00    Pack years: 40.00    Types: Cigarettes    Quit date: 07/26/2019    Years since quitting: 1.5   Smokeless tobacco: Never   Tobacco comments:  1-1.5 pks per year x 40-45 yrs  Vaping Use   Vaping Use: Never used  Substance Use Topics   Alcohol use: Yes    Alcohol/week: 7.0 standard drinks    Types: 7 Shots of liquor per week    Comment: "2 vodka tonics a night"   Drug use: Yes    Comment: occasionally - CBD oil    Current Outpatient Medications  Medication Sig Dispense Refill   acetaminophen (TYLENOL) 500 MG tablet Take 1,000 mg by mouth every 6 (six) hours as needed (pain.).     apixaban (ELIQUIS) 5 MG TABS tablet Take 1 tablet (5 mg total) by mouth 2 (two) times daily. 60 tablet 6   atorvastatin (LIPITOR) 80 MG tablet TAKE 1 TABLET BY MOUTH ONCE DAILY AT 6PM. 90 tablet 3   Carboxymethylcellulose Sodium (ARTIFICIAL TEARS OP) Place 1 drop into both eyes 3 (three) times daily as needed (for dryness or irritation).     dapagliflozin propanediol (FARXIGA) 10 MG TABS tablet Take 1 tablet (10 mg total) by mouth in the morning. 30 tablet 6   digoxin (LANOXIN) 0.125 MG tablet Take 1 tablet (0.125 mg total) by mouth daily. 30 tablet 6   diphenhydrAMINE (BENADRYL) 25 MG tablet Take 50 mg by mouth at bedtime.     eplerenone  (INSPRA) 50 MG tablet Take 0.5 tablets (25 mg total) by mouth in the morning. 30 tablet 6   ezetimibe (ZETIA) 10 MG tablet TAKE ONE TABLET BY MOUTH DAILY 90 tablet 3   furosemide (LASIX) 40 MG tablet Take 1 tablet (40 mg total) by mouth daily. Take extra tab on Monday and Friday 40 tablet 3   gabapentin (NEURONTIN) 100 MG capsule Take 1 capsule (100 mg total) by mouth 4 (four) times daily. 120 capsule 3   ivabradine (CORLANOR) 7.5 MG TABS tablet Take 1 tablet (7.5 mg total) by mouth 2 (two) times daily with a meal. 60 tablet 6   melatonin 5 MG TABS Take 1 tablet (5 mg total) by mouth at bedtime. 30 tablet 6   pantoprazole (PROTONIX) 40 MG tablet Take 1 tablet (40 mg total) by mouth daily. 30 tablet 6   potassium chloride SA (KLOR-CON) 20 MEQ tablet Take 1 tablet (20 mEq total) by mouth daily. 30 tablet 6   REMICADE 100 MG injection Inject into the vein every 6 (six) weeks.     sertraline (ZOLOFT) 25 MG tablet Take 50 mg by mouth daily.     tamsulosin (FLOMAX) 0.4 MG CAPS capsule TAKE 1 CAPSULE(0.4 MG) BY MOUTH DAILY 90 capsule 1   No current facility-administered medications for this visit.    Allergies  Allergen Reactions   Sertraline Other (See Comments)    Made the patient start to feel suicidal    Review of Systems  Constitutional:  Positive for activity change and fatigue. Negative for appetite change, chills, diaphoresis, fever and unexpected weight change.  HENT: Negative.         Sees his dentist regularly  Eyes: Negative.   Respiratory:  Positive for cough and shortness of breath.   Cardiovascular: Negative.  Negative for chest pain, palpitations and leg swelling.  Gastrointestinal: Negative.   Endocrine: Negative.   Genitourinary: Negative.   Musculoskeletal:  Positive for arthralgias, back pain, neck pain and neck stiffness.  Skin: Negative.   Allergic/Immunologic: Negative.   Neurological:  Negative for dizziness and syncope.  Hematological: Negative.    Psychiatric/Behavioral:  The patient is nervous/anxious.    BP 112/72 (BP  Location: Right Arm, Patient Position: Sitting)   Pulse 82   Resp 20   Ht 6' (1.829 m)   Wt 172 lb (78 kg)   SpO2 95% Comment: RA  BMI 23.33 kg/m  Physical Exam Constitutional:      Appearance: Normal appearance. He is normal weight.  HENT:     Head: Normocephalic and atraumatic.  Eyes:     Extraocular Movements: Extraocular movements intact.     Conjunctiva/sclera: Conjunctivae normal.     Pupils: Pupils are equal, round, and reactive to light.  Neck:     Vascular: No carotid bruit.  Cardiovascular:     Rate and Rhythm: Normal rate and regular rhythm.     Pulses: Normal pulses.     Heart sounds: Murmur heard.     Comments: 3/6 systolic murmur along the right sternal border.  There is no diastolic murmur. Pulmonary:     Effort: Pulmonary effort is normal.     Breath sounds: Normal breath sounds.  Abdominal:     General: Abdomen is flat. There is no distension.     Palpations: Abdomen is soft.     Tenderness: There is no abdominal tenderness.  Musculoskeletal:        General: No swelling or tenderness.     Cervical back: Normal range of motion. Rigidity present.  Skin:    General: Skin is warm and dry.  Neurological:     General: No focal deficit present.     Mental Status: He is alert and oriented to person, place, and time.  Psychiatric:        Mood and Affect: Mood normal.        Behavior: Behavior normal.        Thought Content: Thought content normal.        Judgment: Judgment normal.     Diagnostic Tests:     TRANSESOPHOGEAL ECHO REPORT         Patient Name:   Brandon [name] Date of Exam: 11/30/2020  Medical Rec #:  [id number]      Height:       72.0 in  Accession #:    [id number]     Weight:       208.6 lb  Date of Birth:  [birth year]       BSA:          2.169 m  Patient Age:    79 years       BP:           92/71 mmHg  Patient Gender: M              HR:           96 bpm.  Exam  Location:  Inpatient   Procedure: Transesophageal Echo, Cardiac Doppler, Color Doppler and 3D  Echo   Indications:     Endocarditis vs Fibroelastoma     History:         Patient has prior history of Echocardiogram examinations,  most                   recent 05/07/2019. CHF, CAD and Previous Myocardial  Infarction,                   Defibrillator, Aortic Valve Disease; Risk  Factors:Hypertension                   and Dyslipidemia. Chills for two weeks. Positive blood  cultures.     Sonographer:     Merrie Roof RDCS  Referring Phys:  [id number] Shaune Pascal BENSIMHON  Diagnosing Phys: Glori Bickers MD   PROCEDURE: The transesophogeal probe was passed without difficulty through  the esophogus of the patient. Sedation performed by different physician.  The patient was monitored while under deep sedation. Anesthestetic  sedation was provided intravenously by  Anesthesiology: continuousmg of Propofol. The patient developed no  complications during the procedure.   IMPRESSIONS     1. Left ventricular ejection fraction, by estimation, is 20%. The left  ventricle has severely decreased function. The left ventricle demonstrates  global hypokinesis. The left ventricular internal cavity size was mildly  dilated.   2. No vegetation on ICD lead. Right ventricular systolic function is  mildly reduced. The right ventricular size is normal.   3. Left atrial size was severely dilated. No left atrial/left atrial  appendage thrombus was detected.   4. Right atrial size was mildly dilated.   5. The mitral valve is normal in structure. Mild mitral valve  regurgitation.   6. Aortic valve is severely calcified with markedly decresed cusp  excursion. Likely low-flow low gradient AS with mean gradient47mm HG. VR =  0.19. VTI = 7.5. There is a nodular density on the aortic side of the  valve on the [place] that appears stable from  recent CT. Suspect fibroelastoma but given clincal scenario  cannot exclude  vegetation      . The aortic valve is tricuspid. There is severe calcifcation of the  aortic valve. Aortic valve regurgitation is mild. Severe aortic valve  stenosis.   7. There is Severe (Grade IV) plaque involving the descending aorta.   FINDINGS   Left Ventricle: Left ventricular ejection fraction, by estimation, is  20%. The left ventricle has severely decreased function. The left  ventricle demonstrates global hypokinesis. The left ventricular internal  cavity size was mildly dilated.   Right Ventricle: No vegetation on ICD lead. The right ventricular size is  normal. No increase in right ventricular wall thickness. Right ventricular  systolic function is mildly reduced.   Left Atrium: Left atrial size was severely dilated. No left atrial/left  atrial appendage thrombus was detected.   Right Atrium: Right atrial size was mildly dilated.   Pericardium: There is no evidence of pericardial effusion.   Mitral Valve: The mitral valve is normal in structure. Mild mitral valve  regurgitation. There is no evidence of mitral valve vegetation.   Tricuspid Valve: The tricuspid valve is normal in structure. Tricuspid  valve regurgitation is trivial. There is no evidence of tricuspid valve  vegetation.   Aortic Valve: Aortic valve is severely calcified with markedly decresed  cusp excursion. Likely low-flow low gradient AS with mean gradient57mm HG.  VR = 0.19. VTI = 7.5. There is a nodular density on the aortic side of the  valve on the [place] that appears  stable from recent CT. 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  The aortic valve is tricuspid. There is severe calcifcation of the aortic  valve. Aortic valve regurgitation is mild. Severe aortic stenosis is  present. Aortic valve mean gradient measures 18.0 mmHg. Aortic valve peak  gradient measures 33.2 mmHg.   Pulmonic Valve: The pulmonic valve was not well visualized.  Pulmonic valve  regurgitation is not visualized. There is no evidence of pulmonic valve  vegetation.   Aorta: The aortic root and ascending aorta are structurally normal, with  no evidence  of dilitation. There is severe (Grade IV) plaque involving the  descending aorta.   IAS/Shunts: No atrial level shunt detected by color flow Doppler. There is  no evidence of a patent foramen ovale. There is no evidence of an atrial  septal defect.   Additional Comments: A device lead is visualized.      AORTIC VALVE  AV Vmax:           288.00 cm/s  AV Vmean:          194.000 cm/s  AV VTI:            0.445 m  AV Peak Grad:      33.2 mmHg  AV Mean Grad:      18.0 mmHg  LVOT Vmax:         53.30 cm/s  LVOT Vmean:        36.600 cm/s  LVOT VTI:          0.076 m  LVOT/AV VTI ratio: 0.17      SHUNTS  Systemic VTI: 0.08 m   Glori Bickers MD  Electronically signed by Glori Bickers MD  Signature Date/Time: 12/01/2020/7:34:04 PM         Final      Physicians  Panel Physicians Referring Physician Case Authorizing Physician  Bensimhon, Shaune Pascal, MD (Primary)     Procedures  RIGHT/LEFT HEART CATH AND CORONARY ANGIOGRAPHY   Conclusion      Prox Cx lesion is 50% stenosed.   Prox RCA to Mid RCA lesion is 100% stenosed.   Ost RCA to Prox RCA lesion is 50% stenosed.   Non-stenotic Mid LAD lesion was previously treated.   Findings:   Ao = 81/50 (63) LV = 99/12 RA = 4 RV = 24/6 PA = 19/5 (14) PCW = 3 Fick cardiac output/index = 4.7/2.3 PVR = 2.3 WU Ao sat = 99% PA sat = 69%, 70%   Assessment: Stable 2v CAD Severe iCM EF 20% Low filling pressures with moderately reduced cardiac output Heavily calcified aortic valve (difficult to cross). Appears to be moderate by pressures but may have component of low-gradient, low-flow AS   Plan/Discussion:   Continue VAD w/u.    Glori Bickers, MD  10:29 AM     Surgeon Notes    12/04/2020  4:51 PM Op Note signed by Vickie Epley, MD    12/04/2020  4:01 PM Operative Note - Scan signed by Default, Provider, MD    11/30/2020 11:06 AM CV Procedure signed by Jolaine Artist, MD   Indications  CAD in native artery [I25.10 (ICD-10-CM)]  Nonrheumatic aortic valve stenosis [I35.0 (ZOX-09-UE)]  Chronic systolic heart failure ([place]) [I50.22 (ICD-10-CM)]   Procedural Details  Technical Details The risks and indication of the procedure were explained. Consent was signed and placed on the chart. An appropriate timeout was taken prior to the procedure.   The right AC fossa was prepped and draped in the routine sterile fashion and anesthetized with 1% local lidocaine. The pre-existing PIV in the right [facility] was exchanged over a wire for a 5 FR venous sheath using a modified Seldinger technique. A standard Swan-Ganz catheter was used for the procedure.   After a normal Allen's test was confirmed, the right wrist was prepped and draped in the routine sterile fashion and anesthetized with 1% local lidocaine. A 5 FR arterial sheath was then placed in the right radial artery using a modified Seldinger technique. 3mg  IV verapamil was given through the  sheath. Systemic heparin was administered.  Standard catheters including an AL-1 and a JR 4 were used. All catheter exchanges were made over a wire.    Estimated blood loss <50 mL.   Medications (Filter: Administrations occurring from [phone] to 0955 on 11/29/20)  important  Continuous medications are totaled by the amount administered until 11/29/20 0955.   Heparin (Porcine) in NaCl 1000-0.9 UT/500ML-% SOLN (mL) Total volume:  1,000 mL Date/Time Rate/Dose/Volume Action   11/29/20 0847 500 mL Given   0847 500 mL Given    midazolam (VERSED) injection (mg) Total dose:  2 mg Date/Time Rate/Dose/Volume Action   11/29/20 0908 1 mg Given   0918 1 mg Given    fentaNYL (SUBLIMAZE) injection (mcg) Total dose:  25 mcg Date/Time Rate/Dose/Volume Action   11/29/20 0908 25 mcg Given     lidocaine (PF) (XYLOCAINE) 1 % injection (mL) Total volume:  4 mL Date/Time Rate/Dose/Volume Action   11/29/20 0913 2 mL Given   0914 2 mL Given    Radial Cocktail/Verapamil only (mL) Total volume:  10 mL Date/Time Rate/Dose/Volume Action   11/29/20 0920 10 mL Given    heparin sodium (porcine) injection (Units) Total dose:  4,000 Units Date/Time Rate/Dose/Volume Action   11/29/20 0928 4,000 Units Given    iohexol (OMNIPAQUE) 350 MG/ML injection (mL) Total volume:  50 mL Date/Time Rate/Dose/Volume Action   11/29/20 0950 50 mL Given    Sedation Time  Sedation Time Physician-1: 38 minutes 19 seconds Contrast  Medication Name Total Dose  iohexol (OMNIPAQUE) 350 MG/ML injection 50 mL   Radiation/Fluoro  Fluoro time: 11 (min) DAP: 62703 (mGycm2) Cumulative Air Kerma: 883 (mGy) Coronary Findings  Diagnostic Dominance: Right Left Anterior Descending  Non-stenotic Mid LAD lesion was previously treated. Vessel is the culprit lesion.  Left Circumflex  Prox Cx lesion is 50% stenosed.  Right Coronary Artery  Ost RCA to Prox RCA lesion is 50% stenosed.  Prox RCA to Mid RCA lesion is 100% stenosed.  Right Posterior Descending Artery  Collaterals  RPDA filled by collaterals from 2nd Sept.    Intervention  No interventions have been documented. Coronary Diagrams  Diagnostic Dominance: Right Intervention  Implants     No implant documentation for this case.   Syngo Images   Show images for CARDIAC CATHETERIZATION Images on Long Term Storage   Show images for Eberardo, Demello to Procedure Log  Procedure Log    Hemo Data  Flowsheet Row Most Recent Value  Fick Cardiac Output 4.71 L/min  Fick Cardiac Output Index 2.3 (L/min)/BSA  Aortic Mean Gradient 17.3 mmHg  Aortic Peak Gradient 21 mmHg  Aortic Valve Area 1.59  Aortic Value Area Index 0.77 cm2/BSA  RA A Wave 5 mmHg  RA V Wave 5 mmHg  RA Mean 4 mmHg  RV Systolic Pressure 24 mmHg  RV Diastolic  Pressure 3 mmHg  RV EDP 6 mmHg  PA Systolic Pressure 19 mmHg  PA Diastolic Pressure 3 mmHg  PA Mean 14 mmHg  PW A Wave 7 mmHg  PW V Wave 5 mmHg  PW Mean 3 mmHg  AO Systolic Pressure 81 mmHg  AO Diastolic Pressure 50 mmHg  AO Mean 63 mmHg  LV Systolic Pressure 99 mmHg  LV Diastolic Pressure 5 mmHg  LV EDP 12 mmHg  AOp Systolic Pressure 77 mmHg  AOp Diastolic Pressure 48 mmHg  AOp Mean Pressure 61 mmHg  LVp Systolic Pressure 98 mmHg  LVp Diastolic Pressure 5 mmHg  LVp EDP Pressure  12 mmHg  QP/QS 1  TPVR Index 6.1 HRUI  TSVR Index 27.44 HRUI  PVR SVR Ratio 0.19  TPVR/TSVR Ratio 0.22    Narrative & Impression  CLINICAL DATA:  Fever with respiratory failure and abdominal distension. Chest pain or shortness of breath, pleurisy or effusion suspected. Abdominal abscess/infection suspected.   EXAM: CT CHEST, ABDOMEN AND PELVIS WITHOUT CONTRAST   TECHNIQUE: Multidetector CT imaging of the chest, abdomen and pelvis was performed following the standard protocol without IV contrast.   COMPARISON:  Chest radiographs 12/01/2020 and CTs 11/21/2020   FINDINGS: CT CHEST FINDINGS   Cardiovascular: Atherosclerosis of the aorta, great vessels and coronary arteries again noted. No acute vascular findings are seen on noncontrast imaging. There are calcifications of the aortic valve. Left subclavian pacemaker appears unchanged. Presumed right-sided deep brain stimulator appears unchanged. Right IJ venous catheter extends to the level of the upper SVC. The heart size is normal. There is no pericardial effusion.   Mediastinum/Nodes: There are no enlarged mediastinal, hilar or axillary lymph nodes. Endotracheal and nasogastric tubes are in place. The thyroid gland, trachea and esophagus demonstrate no significant findings.   Lungs/Pleura: Since the previous CT, the patient has developed small to moderate dependent pleural effusions and associated bibasilar airspace opacities.  Underlying changes of moderate centrilobular emphysema with chronic central airway thickening. Stable 3 mm right upper lobe nodule on image 29/5.   Musculoskeletal/Chest wall: No chest wall mass or acute osseous findings. Chronic diffuse thoracic spine ankylosis consistent with ankylosing spondylitis.   CT ABDOMEN AND PELVIS FINDINGS   Hepatobiliary: No focal hepatic abnormalities are identified on noncontrast imaging. There are multiple small calcified gallstones. No evidence of gallbladder wall thickening or biliary dilatation.   Pancreas: Unremarkable. No pancreatic ductal dilatation or surrounding inflammatory changes.   Spleen: Normal in size without focal abnormality.   Adrenals/Urinary Tract: Both adrenal glands appear normal. No evidence of urinary tract calculus or hydronephrosis. There is no asymmetric perinephric soft tissue stranding. Bladder is decompressed by Foley catheter. A small amount of gas is present in the bladder lumen.   Stomach/Bowel: No enteric contrast administered. Nasogastric tube extends into the proximal duodenum. The stomach appears unremarkable for its degree of distension. No evidence of bowel wall thickening, distention or surrounding inflammatory change. The appendix appears normal.   Vascular/Lymphatic: There are no enlarged abdominal or pelvic lymph nodes. Diffuse aortic and branch vessel atherosclerosis.   Reproductive: The prostate gland and seminal vesicles appear unremarkable.   Other: Trace pelvic and right pericolic gutter ascites without focal extraluminal fluid collection or gas.   Musculoskeletal: No acute or significant osseous findings. Previous PLIF at L5-S1. Diffuse ankylosis of the spine and sacroiliac joints consistent with ankylosing spondylitis.   IMPRESSION: 1. New bilateral pleural effusions and dependent airspace opacities in both lungs compared with CT of 10 days prior, suspicious for possible aspiration. 2.  Trace abdominal ascites without focal fluid collection or other inflammatory changes to suggest intra-abdominal abscess. 3. Support system positioned as above. 4. Cholelithiasis without evidence of cholecystitis or biliary dilatation. 5. Ankylosing spondylitis. 6. Aortic Atherosclerosis (ICD10-I70.0) and Emphysema (ICD10-J43.9).     Electronically Signed   By: Richardean Sale M.D.   On: 12/01/2020 11:34     Impression:  This 65 year old gentleman has chronic systolic heart failure due to ischemic cardiomyopathy with an ejection fraction of 20% on his most recent echocardiogram.  He remains NYHA class III on GDMT.  Medical treatment has been limited due to relatively low blood  pressure.  He also has a severely calcified aortic valve with restricted leaflet mobility with a mean gradient of only 18 mmHg but probably has severe low-flow/low gradient aortic stenosis.  There is no significant regurgitation.  There is a small mass on one of his leaflets suggesting possible fibroblastoma although a vegetation cannot be ruled out.  He is relatively stable at this time but may require advanced therapy in the not too distant future.  He has a work-up for heart transplant consideration at [facility] in 3 weeks.  I think he would be a candidate for LVAD insertion either as a bridge to transplant or destination therapy.  He has no other significant organ dysfunction, is compliant and very motivated.  He quit smoking over a year ago.  It may be advisable to replace his aortic valve if he does have an LVAD inserted to decrease the risk of embolic disease from his calcified valve and possible fibroblastoma.  He has no significant aortic insufficiency and aortic stenosis does not need to be addressed prior to LVAD therapy.  I will have to think about that further.   Replacing his aortic valve would increase the length of surgery and require myocardial arrest with its risk of RV dysfunction.  I discussed LVAD implantation  surgery, expectations, and risks with him and his sister and answered all their questions.  Plan:  He will have a transplant evaluation at [facility] in 3 weeks and then we will decide about the need for and timing of LVAD surgery.  I spent 60 minutes performing this consultation and > 50% of this time was spent face to face counseling and coordinating the care of this patient's chronic systolic congestive heart failure.   Gaye Pollack, MD [facility] [phone]

## 2021-03-05 ENCOUNTER — Encounter: Payer: Medicare Other | Admitting: Student

## 2021-03-07 ENCOUNTER — Inpatient Hospital Stay (HOSPITAL_COMMUNITY)
Admission: EM | Admit: 2021-03-07 | Discharge: 2021-05-09 | DRG: 001 | Disposition: A | Discharge status: Home Health | Payer: Medicare Other | Attending: Surgery | Admitting: Surgery

## 2021-03-07 ENCOUNTER — Telehealth (HOSPITAL_COMMUNITY): Payer: Self-pay | Admitting: *Deleted

## 2021-03-07 ENCOUNTER — Emergency Department (HOSPITAL_COMMUNITY): Payer: Medicare Other

## 2021-03-07 DIAGNOSIS — I319 Disease of pericardium, unspecified: Secondary | ICD-10-CM | POA: Diagnosis present

## 2021-03-07 DIAGNOSIS — F419 Anxiety disorder, unspecified: Secondary | ICD-10-CM | POA: Diagnosis present

## 2021-03-07 DIAGNOSIS — I10 Essential (primary) hypertension: Secondary | ICD-10-CM | POA: Diagnosis present

## 2021-03-07 DIAGNOSIS — I110 Hypertensive heart disease with heart failure: Secondary | ICD-10-CM | POA: Diagnosis present

## 2021-03-07 DIAGNOSIS — D62 Acute posthemorrhagic anemia: Secondary | ICD-10-CM | POA: Diagnosis not present

## 2021-03-07 DIAGNOSIS — J9620 Acute and chronic respiratory failure, unspecified whether with hypoxia or hypercapnia: Secondary | ICD-10-CM | POA: Diagnosis present

## 2021-03-07 DIAGNOSIS — J9621 Acute and chronic respiratory failure with hypoxia: Secondary | ICD-10-CM | POA: Diagnosis not present

## 2021-03-07 DIAGNOSIS — I330 Acute and subacute infective endocarditis: Secondary | ICD-10-CM | POA: Diagnosis present

## 2021-03-07 DIAGNOSIS — D72829 Elevated white blood cell count, unspecified: Secondary | ICD-10-CM

## 2021-03-07 DIAGNOSIS — J690 Pneumonitis due to inhalation of food and vomit: Secondary | ICD-10-CM

## 2021-03-07 DIAGNOSIS — F32A Depression, unspecified: Secondary | ICD-10-CM | POA: Diagnosis present

## 2021-03-07 DIAGNOSIS — I480 Paroxysmal atrial fibrillation: Secondary | ICD-10-CM | POA: Diagnosis present

## 2021-03-07 DIAGNOSIS — R0689 Other abnormalities of breathing: Secondary | ICD-10-CM

## 2021-03-07 DIAGNOSIS — A419 Sepsis, unspecified organism: Secondary | ICD-10-CM | POA: Diagnosis not present

## 2021-03-07 DIAGNOSIS — I959 Hypotension, unspecified: Secondary | ICD-10-CM

## 2021-03-07 DIAGNOSIS — E871 Hypo-osmolality and hyponatremia: Secondary | ICD-10-CM | POA: Diagnosis present

## 2021-03-07 DIAGNOSIS — J432 Centrilobular emphysema: Secondary | ICD-10-CM | POA: Diagnosis present

## 2021-03-07 DIAGNOSIS — R682 Dry mouth, unspecified: Secondary | ICD-10-CM | POA: Diagnosis not present

## 2021-03-07 DIAGNOSIS — E43 Unspecified severe protein-calorie malnutrition: Secondary | ICD-10-CM | POA: Insufficient documentation

## 2021-03-07 DIAGNOSIS — J9690 Respiratory failure, unspecified, unspecified whether with hypoxia or hypercapnia: Secondary | ICD-10-CM | POA: Diagnosis present

## 2021-03-07 DIAGNOSIS — I272 Pulmonary hypertension, unspecified: Secondary | ICD-10-CM | POA: Diagnosis present

## 2021-03-07 DIAGNOSIS — G629 Polyneuropathy, unspecified: Secondary | ICD-10-CM | POA: Diagnosis present

## 2021-03-07 DIAGNOSIS — U071 COVID-19: Secondary | ICD-10-CM | POA: Diagnosis not present

## 2021-03-07 DIAGNOSIS — Z452 Encounter for adjustment and management of vascular access device: Secondary | ICD-10-CM

## 2021-03-07 DIAGNOSIS — I509 Heart failure, unspecified: Secondary | ICD-10-CM

## 2021-03-07 DIAGNOSIS — R6510 Systemic inflammatory response syndrome (SIRS) of non-infectious origin without acute organ dysfunction: Secondary | ICD-10-CM | POA: Diagnosis present

## 2021-03-07 DIAGNOSIS — E878 Other disorders of electrolyte and fluid balance, not elsewhere classified: Secondary | ICD-10-CM | POA: Diagnosis present

## 2021-03-07 DIAGNOSIS — R109 Unspecified abdominal pain: Secondary | ICD-10-CM

## 2021-03-07 DIAGNOSIS — K5900 Constipation, unspecified: Secondary | ICD-10-CM | POA: Diagnosis not present

## 2021-03-07 DIAGNOSIS — I358 Other nonrheumatic aortic valve disorders: Secondary | ICD-10-CM

## 2021-03-07 DIAGNOSIS — Z8261 Family history of arthritis: Secondary | ICD-10-CM

## 2021-03-07 DIAGNOSIS — Z8249 Family history of ischemic heart disease and other diseases of the circulatory system: Secondary | ICD-10-CM

## 2021-03-07 DIAGNOSIS — R0600 Dyspnea, unspecified: Secondary | ICD-10-CM

## 2021-03-07 DIAGNOSIS — I5022 Chronic systolic (congestive) heart failure: Secondary | ICD-10-CM | POA: Diagnosis present

## 2021-03-07 DIAGNOSIS — Z86010 Personal history of colonic polyps: Secondary | ICD-10-CM

## 2021-03-07 DIAGNOSIS — I350 Nonrheumatic aortic (valve) stenosis: Secondary | ICD-10-CM | POA: Diagnosis present

## 2021-03-07 DIAGNOSIS — Z86718 Personal history of other venous thrombosis and embolism: Secondary | ICD-10-CM

## 2021-03-07 DIAGNOSIS — R338 Other retention of urine: Secondary | ICD-10-CM | POA: Diagnosis not present

## 2021-03-07 DIAGNOSIS — M25512 Pain in left shoulder: Secondary | ICD-10-CM | POA: Diagnosis present

## 2021-03-07 DIAGNOSIS — I2510 Atherosclerotic heart disease of native coronary artery without angina pectoris: Secondary | ICD-10-CM | POA: Diagnosis not present

## 2021-03-07 DIAGNOSIS — R7881 Bacteremia: Secondary | ICD-10-CM

## 2021-03-07 DIAGNOSIS — I252 Old myocardial infarction: Secondary | ICD-10-CM

## 2021-03-07 DIAGNOSIS — I5023 Acute on chronic systolic (congestive) heart failure: Secondary | ICD-10-CM | POA: Diagnosis present

## 2021-03-07 DIAGNOSIS — L89151 Pressure ulcer of sacral region, stage 1: Secondary | ICD-10-CM | POA: Diagnosis present

## 2021-03-07 DIAGNOSIS — I255 Ischemic cardiomyopathy: Secondary | ICD-10-CM | POA: Diagnosis present

## 2021-03-07 DIAGNOSIS — Z9889 Other specified postprocedural states: Secondary | ICD-10-CM

## 2021-03-07 DIAGNOSIS — Z87891 Personal history of nicotine dependence: Secondary | ICD-10-CM

## 2021-03-07 DIAGNOSIS — Z955 Presence of coronary angioplasty implant and graft: Secondary | ICD-10-CM

## 2021-03-07 DIAGNOSIS — M459 Ankylosing spondylitis of unspecified sites in spine: Secondary | ICD-10-CM

## 2021-03-07 DIAGNOSIS — R0602 Shortness of breath: Secondary | ICD-10-CM

## 2021-03-07 DIAGNOSIS — R300 Dysuria: Secondary | ICD-10-CM | POA: Diagnosis not present

## 2021-03-07 DIAGNOSIS — J189 Pneumonia, unspecified organism: Secondary | ICD-10-CM

## 2021-03-07 DIAGNOSIS — E876 Hypokalemia: Secondary | ICD-10-CM | POA: Diagnosis not present

## 2021-03-07 DIAGNOSIS — Z95811 Presence of heart assist device: Secondary | ICD-10-CM

## 2021-03-07 DIAGNOSIS — E785 Hyperlipidemia, unspecified: Secondary | ICD-10-CM | POA: Diagnosis present

## 2021-03-07 DIAGNOSIS — N401 Enlarged prostate with lower urinary tract symptoms: Secondary | ICD-10-CM | POA: Diagnosis present

## 2021-03-07 DIAGNOSIS — J90 Pleural effusion, not elsewhere classified: Secondary | ICD-10-CM

## 2021-03-07 DIAGNOSIS — Z87442 Personal history of urinary calculi: Secondary | ICD-10-CM

## 2021-03-07 DIAGNOSIS — D509 Iron deficiency anemia, unspecified: Secondary | ICD-10-CM | POA: Diagnosis present

## 2021-03-07 DIAGNOSIS — D649 Anemia, unspecified: Secondary | ICD-10-CM | POA: Diagnosis present

## 2021-03-07 DIAGNOSIS — Z95828 Presence of other vascular implants and grafts: Secondary | ICD-10-CM

## 2021-03-07 DIAGNOSIS — J849 Interstitial pulmonary disease, unspecified: Secondary | ICD-10-CM

## 2021-03-07 DIAGNOSIS — J918 Pleural effusion in other conditions classified elsewhere: Secondary | ICD-10-CM | POA: Diagnosis present

## 2021-03-07 DIAGNOSIS — J180 Bronchopneumonia, unspecified organism: Secondary | ICD-10-CM | POA: Diagnosis present

## 2021-03-07 DIAGNOSIS — Z7901 Long term (current) use of anticoagulants: Secondary | ICD-10-CM

## 2021-03-07 DIAGNOSIS — Z79899 Other long term (current) drug therapy: Secondary | ICD-10-CM

## 2021-03-07 LAB — HEPATIC FUNCTION PANEL
ALT: 19 U/L (ref 0–44)
AST: 20 U/L (ref 15–41)
Albumin: 2.8 g/dL — ABNORMAL LOW (ref 3.5–5.0)
Alkaline Phosphatase: 92 U/L (ref 38–126)
Bilirubin, Direct: 0.3 mg/dL — ABNORMAL HIGH (ref 0.0–0.2)
Indirect Bilirubin: 1.2 mg/dL — ABNORMAL HIGH (ref 0.3–0.9)
Total Bilirubin: 1.5 mg/dL — ABNORMAL HIGH (ref 0.3–1.2)
Total Protein: 6.5 g/dL (ref 6.5–8.1)

## 2021-03-07 LAB — I-STAT ARTERIAL BLOOD GAS, ED
Acid-Base Excess: 2 mmol/L (ref 0.0–2.0)
Bicarbonate: 26 mmol/L (ref 20.0–28.0)
Calcium, Ion: 1.19 mmol/L (ref 1.15–1.40)
HCT: 34 % — ABNORMAL LOW (ref 39.0–52.0)
Hemoglobin: 11.6 g/dL — ABNORMAL LOW (ref 13.0–17.0)
O2 Saturation: 98 %
Patient temperature: 98.4
Potassium: 3.5 mmol/L (ref 3.5–5.1)
Sodium: 134 mmol/L — ABNORMAL LOW (ref 135–145)
TCO2: 27 mmol/L (ref 22–32)
pCO2 arterial: 37.3 mmHg (ref 32.0–48.0)
pH, Arterial: 7.451 — ABNORMAL HIGH (ref 7.350–7.450)
pO2, Arterial: 102 mmHg (ref 83.0–108.0)

## 2021-03-07 LAB — CBC
HCT: 34.4 % — ABNORMAL LOW (ref 39.0–52.0)
Hemoglobin: 10.9 g/dL — ABNORMAL LOW (ref 13.0–17.0)
MCH: 25.3 pg — ABNORMAL LOW (ref 26.0–34.0)
MCHC: 31.7 g/dL (ref 30.0–36.0)
MCV: 80 fL (ref 80.0–100.0)
Platelets: 329 10*3/uL (ref 150–400)
RBC: 4.3 MIL/uL (ref 4.22–5.81)
RDW: 17.4 % — ABNORMAL HIGH (ref 11.5–15.5)
WBC: 25.3 10*3/uL — ABNORMAL HIGH (ref 4.0–10.5)
nRBC: 0 % (ref 0.0–0.2)

## 2021-03-07 LAB — BASIC METABOLIC PANEL
Anion gap: 8 (ref 5–15)
BUN: 15 mg/dL (ref 8–23)
CO2: 25 mmol/L (ref 22–32)
Calcium: 8.7 mg/dL — ABNORMAL LOW (ref 8.9–10.3)
Chloride: 100 mmol/L (ref 98–111)
Creatinine, Ser: 0.7 mg/dL (ref 0.61–1.24)
GFR, Estimated: >60 mL/min (ref >60)
Glucose, Bld: 113 mg/dL — ABNORMAL HIGH (ref 70–99)
Potassium: 3.6 mmol/L (ref 3.5–5.1)
Sodium: 133 mmol/L — ABNORMAL LOW (ref 135–145)

## 2021-03-07 LAB — LACTIC ACID, PLASMA
Lactic Acid, Venous: 1.2 mmol/L (ref 0.5–1.9)
Lactic Acid, Venous: 1.1 mmol/L (ref 0.5–1.9)

## 2021-03-07 LAB — RESP PANEL BY RT-PCR (FLU A&B, COVID) ARPGX2
Influenza A by PCR: NEGATIVE
Influenza B by PCR: NEGATIVE
SARS Coronavirus 2 by RT PCR: NEGATIVE

## 2021-03-07 LAB — PROCALCITONIN: Procalcitonin: 0.16 ng/mL

## 2021-03-07 MED ORDER — GABAPENTIN 100 MG PO CAPS
100.0000 mg | ORAL_CAPSULE | Freq: Four times a day (QID) | ORAL | Status: DC
  Administered 2021-03-07: 100 mg via ORAL
  Filled 2021-03-07: qty 1

## 2021-03-07 MED ORDER — ACETAMINOPHEN 325 MG PO TABS
650.0000 mg | ORAL_TABLET | Freq: Four times a day (QID) | ORAL | Status: DC | PRN
  Administered 2021-03-07 – 2021-04-05 (×60): 650 mg via ORAL
  Filled 2021-03-07 (×60): qty 2

## 2021-03-07 MED ORDER — SODIUM CHLORIDE 0.9% FLUSH: 3.0000 mL | INTRAVENOUS | Status: DC | PRN

## 2021-03-07 MED ORDER — DIPHENHYDRAMINE HCL 25 MG PO CAPS
50.0000 mg | ORAL_CAPSULE | Freq: Every day | ORAL | Status: DC
  Administered 2021-03-07 – 2021-04-05 (×30): 50 mg via ORAL
  Filled 2021-03-07 (×31): qty 2

## 2021-03-07 MED ORDER — ACETAMINOPHEN 650 MG RE SUPP: 650.0000 mg | Freq: Four times a day (QID) | RECTAL | Status: DC | PRN

## 2021-03-07 MED ORDER — SERTRALINE HCL 25 MG PO TABS
50.0000 mg | ORAL_TABLET | Freq: Every day | ORAL | Status: DC
  Administered 2021-03-08 – 2021-03-25 (×18): 50 mg via ORAL
  Filled 2021-03-07 (×18): qty 2

## 2021-03-07 MED ORDER — MELATONIN 5 MG PO TABS
5.0000 mg | ORAL_TABLET | Freq: Every day | ORAL | Status: DC
  Administered 2021-03-07 – 2021-04-05 (×30): 5 mg via ORAL
  Filled 2021-03-07 (×31): qty 1

## 2021-03-07 MED ORDER — ATORVASTATIN CALCIUM 80 MG PO TABS
80.0000 mg | ORAL_TABLET | Freq: Every evening | ORAL | Status: DC
  Administered 2021-03-07 – 2021-04-05 (×30): 80 mg via ORAL
  Filled 2021-03-07 (×31): qty 1

## 2021-03-07 MED ORDER — TAMSULOSIN HCL 0.4 MG PO CAPS
0.4000 mg | ORAL_CAPSULE | Freq: Every day | ORAL | Status: DC
  Administered 2021-03-08 – 2021-05-09 (×62): 0.4 mg via ORAL
  Filled 2021-03-07 (×62): qty 1

## 2021-03-07 MED ORDER — VANCOMYCIN HCL 1250 MG/250ML IV SOLN
1250.0000 mg | Freq: Two times a day (BID) | INTRAVENOUS | Status: DC
  Administered 2021-03-08 – 2021-03-09 (×3): 1250 mg via INTRAVENOUS
  Filled 2021-03-07 (×7): qty 250

## 2021-03-07 MED ORDER — ONDANSETRON HCL 4 MG/2ML IJ SOLN
4.0000 mg | Freq: Four times a day (QID) | INTRAMUSCULAR | Status: DC | PRN
  Administered 2021-03-09 – 2021-04-04 (×12): 4 mg via INTRAVENOUS
  Filled 2021-03-07 (×12): qty 2

## 2021-03-07 MED ORDER — SODIUM CHLORIDE 0.9% FLUSH
3.0000 mL | Freq: Two times a day (BID) | INTRAVENOUS | Status: DC
  Administered 2021-03-07: 3 mL via INTRAVENOUS

## 2021-03-07 MED ORDER — DIGOXIN 125 MCG PO TABS
0.1250 mg | ORAL_TABLET | Freq: Every day | ORAL | Status: DC
  Administered 2021-03-08 – 2021-05-09 (×62): 0.125 mg via ORAL
  Filled 2021-03-07 (×62): qty 1

## 2021-03-07 MED ORDER — SODIUM CHLORIDE 0.9 % IV SOLN
250.0000 mL | INTRAVENOUS | Status: DC | PRN
  Administered 2021-03-08: 250 mL via INTRAVENOUS

## 2021-03-07 MED ORDER — PANTOPRAZOLE SODIUM 40 MG PO TBEC
40.0000 mg | DELAYED_RELEASE_TABLET | Freq: Every day | ORAL | Status: DC
  Administered 2021-03-08 – 2021-04-05 (×29): 40 mg via ORAL
  Filled 2021-03-07 (×29): qty 1

## 2021-03-07 MED ORDER — ONDANSETRON HCL 4 MG PO TABS
4.0000 mg | ORAL_TABLET | Freq: Four times a day (QID) | ORAL | Status: DC | PRN
  Administered 2021-03-08 – 2021-03-24 (×2): 4 mg via ORAL
  Filled 2021-03-07 (×2): qty 1

## 2021-03-07 MED ORDER — SODIUM CHLORIDE 0.9 % IV SOLN
2.0000 g | Freq: Once | INTRAVENOUS | Status: AC
  Administered 2021-03-07: 2 g via INTRAVENOUS
  Filled 2021-03-07: qty 2

## 2021-03-07 MED ORDER — APIXABAN 5 MG PO TABS
5.0000 mg | ORAL_TABLET | Freq: Two times a day (BID) | ORAL | Status: DC
  Administered 2021-03-07 – 2021-03-27 (×40): 5 mg via ORAL
  Filled 2021-03-07 (×39): qty 1

## 2021-03-07 MED ORDER — EZETIMIBE 10 MG PO TABS
10.0000 mg | ORAL_TABLET | Freq: Every day | ORAL | Status: DC
  Administered 2021-03-08 – 2021-05-09 (×62): 10 mg via ORAL
  Filled 2021-03-07 (×62): qty 1

## 2021-03-07 MED ORDER — DIPHENHYDRAMINE HCL 25 MG PO TABS
50.0000 mg | ORAL_TABLET | Freq: Every day | ORAL | Status: DC
  Filled 2021-03-07: qty 2

## 2021-03-07 MED ORDER — SODIUM CHLORIDE 0.9 % IV SOLN
2.0000 g | Freq: Three times a day (TID) | INTRAVENOUS | Status: DC
  Administered 2021-03-08 – 2021-03-10 (×8): 2 g via INTRAVENOUS
  Filled 2021-03-07 (×8): qty 2

## 2021-03-07 MED ORDER — POTASSIUM CHLORIDE CRYS ER 20 MEQ PO TBCR
20.0000 meq | EXTENDED_RELEASE_TABLET | Freq: Every day | ORAL | Status: DC
  Administered 2021-03-08 – 2021-03-09 (×2): 20 meq via ORAL
  Filled 2021-03-07 (×2): qty 1

## 2021-03-07 MED ORDER — VANCOMYCIN HCL 1500 MG/300ML IV SOLN
1500.0000 mg | Freq: Once | INTRAVENOUS | Status: AC
  Administered 2021-03-07: 1500 mg via INTRAVENOUS
  Filled 2021-03-07: qty 300

## 2021-03-07 NOTE — ED Provider Notes (Signed)
[place] EMERGENCY DEPARTMENT Provider Note   CSN: [id number] Arrival date & time: 03/07/21  1128     History Chief Complaint  Patient presents with   Shortness of Breath    Brandon [name] is a 65 y.o. male.  HPI 65 year old male history of COPD, hypertension, MI, intermittent oxygen use presents today complaining of dyspnea.  He states that he was in his normal state of health last night.  He was not using oxygen during the night.  When he woke up this morning, he felt very dyspneic.  He reports that his pulse ox was 80.  He reapplied his normal 2 L of oxygen and his pulse ox consider continued in the 80s.  EMS was eventually called.  He required 5 L of oxygen as oxygen saturations improved up to the 90s.  He reports that he had some chills and nausea.  He denies chest pain, fever, or cough.  He reports that he has been vaccinated for COVID with 1 booster.  He has had a fairly recent episode of sepsis 2-1/2 months ago.  He also has congestive heart failure.  He can hold the congestive heart failure clinic and was told that he needed to come to the hospital for concerns of sepsis.  Review of records reveal ED evaluation 29th 2022 for shortness of breath.  Prior to that in July to August 1 he was admitted with strep bacteremia and septic shock.  This occurred after an admission for a heart catheterization.  He was then readmitted with hypoxia and hypotension and required intubation and pressors. History of MI with cardiogenic shock requiring milrinone 04/27/2019 History of Bero stem placement History of significantly reduced EF at 20% 11/29/2020 with associated hypotension, hypoxia, blood cultures positive for strep gordonii had ICD extraction on 12/04/2020.  ID recommended ceftriaxone until 01/15/2021 via PICC.  Patient was discharged with PICC and LifeVest    Past Medical History:  Diagnosis Date   AICD (automatic cardioverter/defibrillator) present 08/31/2019    Ankylosing spondylitis ([place])    Arthritis    BENIGN PROSTATIC HYPERTROPHY 06/07/2008   CHF (congestive heart failure) ([place])    COLONIC POLYPS, HX OF 06/07/2008   Depression    H/O hiatal hernia    Heart failure ([place])    HYPERLIPIDEMIA 06/07/2008   HYPERTENSION 06/07/2008   Myocardial infarction [facility]) 2005   NSTEMI, s/p LAD stent   NEPHROLITHIASIS, HX OF 06/07/2008   STEMI (ST elevation myocardial infarction) ([place]) 04/27/2019    Patient Active Problem List   Diagnosis Date Noted   Acute on chronic respiratory failure with hypoxia ([place]) 03/07/2021   Systemic inflammatory response syndrome (SIRS) ([place]) 98/33/8250   Chronic systolic CHF (congestive heart failure) ([place]) 03/07/2021   Acute on chronic systolic (congestive) heart failure ([place]) 01/08/2021   ICD (implantable cardioverter-defibrillator) in place    Dental caries    Malnutrition of moderate degree 12/01/2020   Pressure injury of skin 12/01/2020   Cholelithiases    Aortic valve endocarditis    Pleural effusion    Aspiration into airway    Elevated brain natriuretic peptide (BNP) level 11/30/2020   Elevated troponin 11/30/2020   Hypokalemia 11/30/2020   Hypomagnesemia 11/30/2020   Normocytic anemia 11/30/2020   Dyspnea    Respiratory failure ([place])    History of ETT    Infected defibrillator ([place])    Streptococcal bacteremia    Central line infection    Acute respiratory failure with hypoxia and hypercapnia ([place]) 11/29/2020  Centrilobular emphysema ([place]) 08/09/2020   Benign neoplasm of colon    Heart failure ([place]) 12/31/2019   Ischemic cardiomyopathy 08/31/2019   Cardiogenic shock (H[facility])    STEMI (ST elevation myocardial infarction) ([place]) 04/27/2019   Acute anterior wall MI ([place]) 04/27/2019   Mild aortic stenosis 12/02/2012   Ankylosis of sacroiliac joint 03/08/2011   TOBACCO ABUSE 03/13/2009   Hyperlipidemia with target LDL less than 70 06/07/2008   Essential hypertension 06/07/2008   CAD (coronary artery disease)  06/07/2008   BENIGN PROSTATIC HYPERTROPHY 06/07/2008   History of colon polyps 06/07/2008   NEPHROLITHIASIS, HX OF 06/07/2008    Past Surgical History:  Procedure Laterality Date   COLONOSCOPY WITH PROPOFOL N/A 04/24/2020   Procedure: COLONOSCOPY WITH PROPOFOL;  Surgeon: [name], MD;  Location: [facility];  Service: Gastroenterology;  Laterality: N/A;   CORONARY ANGIOPLASTY WITH STENT PLACEMENT  2005   LAD stent, jailed diagonal   CORONARY/GRAFT ACUTE MI REVASCULARIZATION N/A 04/27/2019   Procedure: Coronary/Graft Acute MI Revascularization;  Surgeon: [name], MD;  Location: [place] [facility];  Service: Cardiovascular;  Laterality: N/A;   ICD IMPLANT  08/31/2019   ICD IMPLANT N/A 08/31/2019   Procedure: ICD IMPLANT;  Surgeon: [name], MD;  Location: [place] [facility];  Service: Cardiovascular;  Laterality: N/A;   ICD LEAD REMOVAL N/A 12/04/2020   Procedure: ICD SYSTEM EXTRACTION;  Surgeon: Vickie Epley, MD;  Location: [place];  Service: Cardiovascular;  Laterality: N/A;   LAMINECTOMY     lumbar   LEFT HEART CATH AND CORONARY ANGIOGRAPHY N/A 04/27/2019   Procedure: LEFT HEART CATH AND CORONARY ANGIOGRAPHY;  Surgeon: [name], MD;  Location: [place] [facility];  Service: Cardiovascular;  Laterality: N/A;   LUMBAR FUSION  01/2012   POLYPECTOMY  04/24/2020   Procedure: POLYPECTOMY;  Surgeon: [name], MD;  Location: [facility];  Service: Gastroenterology;;   RIGHT HEART CATH N/A 04/27/2019   Procedure: RIGHT HEART CATH;  Surgeon: [name], Vale M, MD;  Location: [place] [facility];  Service: Cardiovascular;  Laterality: N/A;   RIGHT/LEFT HEART CATH AND CORONARY ANGIOGRAPHY N/A 11/29/2020   Procedure: RIGHT/LEFT HEART CATH AND CORONARY ANGIOGRAPHY;  Surgeon: Jolaine Artist, MD;  Location: [place] [facility];  Service: Cardiovascular;  Laterality: N/A;   TEE WITHOUT CARDIOVERSION N/A 12/04/2020   Procedure: TRANSESOPHAGEAL  ECHOCARDIOGRAM (TEE);  Surgeon: Vickie Epley, MD;  Location: [facility] OR;  Service: Cardiovascular;  Laterality: N/A;   TONSILLECTOMY     warthin tumor removal Left 2014       Family History  Problem Relation Age of Onset   Depression Father    Arthritis Sister        RA   Heart failure Mother    Other Neg Hx    Colon cancer Neg Hx    Esophageal cancer Neg Hx    Rectal cancer Neg Hx    Stomach cancer Neg Hx    Colon polyps Neg Hx     Social History   Tobacco Use   Smoking status: Former    Packs/day: 1.00    Years: 40.00    Pack years: 40.00    Types: Cigarettes    Quit date: 07/26/2019    Years since quitting: 1.6   Smokeless tobacco: Never   Tobacco comments:    1-1.5 pks per year x 40-45 yrs  Vaping Use   Vaping Use: Never used  Substance Use Topics   Alcohol use:  Yes    Alcohol/week: 7.0 standard drinks    Types: 7 Shots of liquor per week    Comment: "2 vodka tonics a night"   Drug use: Yes    Comment: occasionally - CBD oil    Home Medications Prior to Admission medications   Medication Sig Start Date End Date Taking? Authorizing Provider  acetaminophen (TYLENOL) 500 MG tablet Take 1,000 mg by mouth every 6 (six) hours as needed (pain.).   Yes [provider]  apixaban (ELIQUIS) 5 MG TABS tablet Take 1 tablet (5 mg total) by mouth 2 (two) times daily. 12/11/20  Yes Clegg, Amy D, NP  atorvastatin (LIPITOR) 80 MG tablet TAKE 1 TABLET BY MOUTH ONCE DAILY AT 6PM. Patient taking differently: Take 80 mg by mouth every evening. 06/19/20  Yes Hilty, Nadean Corwin, MD  Carboxymethylcellulose Sodium (ARTIFICIAL TEARS OP) Place 1 drop into both eyes 3 (three) times daily as needed (for dryness or irritation).   Yes [provider]  dapagliflozin propanediol (FARXIGA) 10 MG TABS tablet Take 1 tablet (10 mg total) by mouth in the morning. 12/11/20  Yes Clegg, Amy D, NP  digoxin (LANOXIN) 0.125 MG tablet Take 1 tablet (0.125 mg total) by mouth daily. 12/12/20  Yes  Clegg, Amy D, NP  diphenhydrAMINE (BENADRYL) 25 MG tablet Take 50 mg by mouth at bedtime.   Yes [provider]  eplerenone (INSPRA) 50 MG tablet Take 0.5 tablets (25 mg total) by mouth in the morning. 12/11/20  Yes Clegg, Amy D, NP  ezetimibe (ZETIA) 10 MG tablet TAKE ONE TABLET BY MOUTH DAILY Patient taking differently: Take 10 mg by mouth daily. 05/30/20  Yes Lelon Perla, MD  furosemide (LASIX) 40 MG tablet Take 1 tablet (40 mg total) by mouth daily. Take extra tab on Monday and Friday 01/18/21  Yes Bensimhon, Shaune Pascal, MD  gabapentin (NEURONTIN) 100 MG capsule Take 1 capsule (100 mg total) by mouth 4 (four) times daily. 01/30/21  Yes Bensimhon, Shaune Pascal, MD  ivabradine (CORLANOR) 7.5 MG TABS tablet Take 1 tablet (7.5 mg total) by mouth 2 (two) times daily with a meal. 12/11/20  Yes Clegg, Amy D, NP  melatonin 5 MG TABS Take 1 tablet (5 mg total) by mouth at bedtime. 12/11/20  Yes Clegg, Amy D, NP  pantoprazole (PROTONIX) 40 MG tablet Take 1 tablet (40 mg total) by mouth daily. 01/30/21  Yes Bensimhon, Shaune Pascal, MD  potassium chloride SA (KLOR-CON) 20 MEQ tablet Take 1 tablet (20 mEq total) by mouth daily. 01/18/21  Yes Bensimhon, Shaune Pascal, MD  sertraline (ZOLOFT) 25 MG tablet Take 50 mg by mouth daily.   Yes [provider]  tamsulosin (FLOMAX) 0.4 MG CAPS capsule TAKE 1 CAPSULE(0.4 MG) BY MOUTH DAILY Patient taking differently: Take 0.4 mg by mouth daily. 10/03/20  Yes [name], MD    Allergies    Patient has no active allergies.  Review of Systems   Review of Systems  All other systems reviewed and are negative.  Physical Exam Updated Vital Signs BP 107/79 (BP Location: Left Arm)   Pulse (!) 105   Temp 98.1 F (36.7 C) (Oral)   Resp (!) 21   Ht 1.829 m (6')   Wt 80.5 kg   SpO2 93%   BMI 24.07 kg/m   Physical Exam Vitals and nursing note reviewed.  Constitutional:      Appearance: He is well-developed.  HENT:     Head: Normocephalic.  Mouth/Throat:      Mouth: Mucous membranes are moist.  Eyes:     Pupils: Pupils are equal, round, and reactive to light.  Cardiovascular:     Rate and Rhythm: Normal rate and regular rhythm.  Pulmonary:     Effort: Pulmonary effort is normal.     Breath sounds: Examination of the right-lower field reveals decreased breath sounds. Examination of the left-lower field reveals decreased breath sounds. Decreased breath sounds present.  Abdominal:     General: Bowel sounds are normal.     Palpations: Abdomen is soft.  Musculoskeletal:        General: Normal range of motion.     Cervical back: Normal range of motion and neck supple.     Right lower leg: No tenderness. No edema.     Left lower leg: No tenderness. No edema.  Skin:    General: Skin is warm and dry.     Capillary Refill: Capillary refill takes less than 2 seconds.  Neurological:     General: No focal deficit present.     Mental Status: He is alert.  Psychiatric:        Mood and Affect: Mood normal.    ED Results / Procedures / Treatments   Labs (all labs ordered are listed, but only abnormal results are displayed) Labs Reviewed  CBC - Abnormal; Notable for the following components:      Result Value   WBC 25.3 (*)    Hemoglobin 10.9 (*)    HCT 34.4 (*)    MCH 25.3 (*)    RDW 17.4 (*)    All other components within normal limits  BASIC METABOLIC PANEL - Abnormal; Notable for the following components:   Sodium 133 (*)    Glucose, Bld 113 (*)    Calcium 8.7 (*)    All other components within normal limits  HEPATIC FUNCTION PANEL - Abnormal; Notable for the following components:   Albumin 2.8 (*)    Total Bilirubin 1.5 (*)    Bilirubin, Direct 0.3 (*)    Indirect Bilirubin 1.2 (*)    All other components within normal limits  CBC WITH DIFFERENTIAL/PLATELET - Abnormal; Notable for the following components:   WBC 16.0 (*)    RBC 4.04 (*)    Hemoglobin 10.1 (*)    HCT 32.2 (*)    MCV 79.7 (*)    MCH 25.0 (*)    RDW 17.5 (*)     Neutro Abs 14.9 (*)    Lymphs Abs 0.4 (*)    Abs Immature Granulocytes 0.10 (*)    All other components within normal limits  BASIC METABOLIC PANEL - Abnormal; Notable for the following components:   Sodium 128 (*)    Potassium 3.2 (*)    Chloride 97 (*)    CO2 20 (*)    Glucose, Bld 142 (*)    Calcium 8.3 (*)    All other components within normal limits  BASIC METABOLIC PANEL - Abnormal; Notable for the following components:   Sodium 131 (*)    Glucose, Bld 103 (*)    Creatinine, Ser 0.60 (*)    Calcium 8.7 (*)    All other components within normal limits  CBC - Abnormal; Notable for the following components:   WBC 15.9 (*)    RBC 4.16 (*)    Hemoglobin 10.4 (*)    HCT 33.4 (*)    MCH 25.0 (*)    RDW 17.6 (*)  All other components within normal limits  I-STAT ARTERIAL BLOOD GAS, ED - Abnormal; Notable for the following components:   pH, Arterial 7.451 (*)    Sodium 134 (*)    HCT 34.0 (*)    Hemoglobin 11.6 (*)    All other components within normal limits  RESP PANEL BY RT-PCR (FLU A&B, COVID) ARPGX2  CULTURE, BLOOD (ROUTINE X 2)  CULTURE, BLOOD (ROUTINE X 2)  [name] NEXT GEN BY PCR, NASAL  RESPIRATORY PANEL BY PCR  URINE CULTURE  LACTIC ACID, PLASMA  LACTIC ACID, PLASMA  PROCALCITONIN  PROCALCITONIN  PROCALCITONIN  URINALYSIS, ROUTINE W REFLEX MICROSCOPIC  CBC WITH DIFFERENTIAL/PLATELET    EKG EKG Interpretation  Date/Time:  Wednesday March 07 2021 11:45:59 EDT Ventricular Rate:  98 PR Interval:  157 QRS Duration: 143 QT Interval:  348 QTC Calculation: 445 R Axis:   -14 Text Interpretation: Sinus or ectopic atrial rhythm Biatrial enlargement Left bundle branch block Artifact in lead(s) I II III aVR aVL aVF V1 V2 V4 V5 V6 Confirmed by Varney Biles [id number]) on 03/08/2021 7:46:39 PM  Radiology DG CHEST PORT 1 VIEW  Result Date: 03/09/2021 CLINICAL DATA:  Shortness of breath EXAM: PORTABLE CHEST 1 VIEW COMPARISON:  Previous studies including the  examination of 03/07/2021 FINDINGS: Transverse diameter of heart is within normal limits. There is significant interval increase in interstitial and alveolar densities in both lungs, more so in the upper lung fields. Lateral CP angles are essentially clear. There is no pneumothorax. There is an electronic device in the right chest wall with lead extending to the neck. IMPRESSION: There is significant interval increase in interstitial and alveolar markings in both lungs, more so in the upper lung fields suggesting pulmonary edema or bilateral pneumonia. There is no significant pleural effusion or pneumothorax. Reading location: [place], [place]. Electronically Signed   By: Elmer Picker M.D.   On: 03/09/2021 09:20   DG Chest Port 1 View  Result Date: 03/07/2021 CLINICAL DATA:  Shortness of breath since 5 a.m. this morning. Nausea, vomiting and chills. Hypoxemia. EXAM: PORTABLE CHEST 1 VIEW COMPARISON:  Radiographs 01/08/2021 and 12/04/2020.  CT 12/01/2020. FINDINGS: 1207 hours. Left arm PICC has been removed in the interval. Right chest immune later remains in place, with lead projecting into the neck, tip not visualized. The heart size and mediastinal contours are stable. The generalized interstitial prominence seen on previous studies has mildly improved, and the pleural effusions have resolved. There is no confluent airspace opacity or pneumothorax. IMPRESSION: Improved interstitial opacities and pleural effusions compared with most recent study of 2 months ago, most consistent with resolving pulmonary edema. No new findings. Electronically Signed   By: Richardean Sale M.D.   On: 03/07/2021 13:01    Procedures Procedures   Medications Ordered in ED Medications  acetaminophen (TYLENOL) tablet 650 mg (650 mg Oral Given 03/09/21 0836)    Or  acetaminophen (TYLENOL) suppository 650 mg ( Rectal See Alternative 03/09/21 0836)  ondansetron (ZOFRAN) tablet 4 mg (4 mg Oral Given 03/08/21 0659)     Or  ondansetron (ZOFRAN) injection 4 mg ( Intravenous See Alternative 03/08/21 0659)  ceFEPIme (MAXIPIME) 2 g in sodium chloride 0.9 % 100 mL IVPB (2 g Intravenous New Bag/Given 03/09/21 0835)  vancomycin (VANCOREADY) IVPB 1250 mg/250 mL (1,250 mg Intravenous New Bag/Given 03/09/21 1029)  atorvastatin (LIPITOR) tablet 80 mg (80 mg Oral Given 03/08/21 1620)  digoxin (LANOXIN) tablet 0.125 mg (0.125 mg Oral Given 03/09/21 0836)  ezetimibe (ZETIA) tablet 10  mg (10 mg Oral Given 03/09/21 0836)  sertraline (ZOLOFT) tablet 50 mg (50 mg Oral Given 03/09/21 0836)  pantoprazole (PROTONIX) EC tablet 40 mg (40 mg Oral Given 03/09/21 0836)  tamsulosin (FLOMAX) capsule 0.4 mg (0.4 mg Oral Given 03/09/21 0836)  apixaban (ELIQUIS) tablet 5 mg (5 mg Oral Given 03/09/21 0836)  melatonin tablet 5 mg (5 mg Oral Given 03/08/21 2121)  diphenhydrAMINE (BENADRYL) capsule 50 mg (50 mg Oral Given 03/08/21 2121)  gabapentin (NEURONTIN) capsule 200 mg (200 mg Oral Given 03/09/21 0836)  ivabradine (CORLANOR) tablet 7.5 mg (7.5 mg Oral Given 03/09/21 0939)  midodrine (PROAMATINE) tablet 5 mg (5 mg Oral Given 03/09/21 0836)  furosemide (LASIX) injection 80 mg (80 mg Intravenous Given 03/09/21 0939)  vancomycin (VANCOREADY) IVPB 1500 mg/300 mL (0 mg Intravenous Stopped 03/08/21 0118)  ceFEPIme (MAXIPIME) 2 g in sodium chloride 0.9 % 100 mL IVPB (0 g Intravenous Stopped 03/07/21 1628)  furosemide (LASIX) injection 40 mg (40 mg Intravenous Given 03/08/21 0931)  potassium chloride SA (KLOR-CON) CR tablet 20 mEq (20 mEq Oral Given 03/08/21 1139)  potassium chloride SA (KLOR-CON) CR tablet 40 mEq (40 mEq Oral Given 03/08/21 1621)  potassium chloride SA (KLOR-CON) CR tablet 40 mEq (40 mEq Oral Given 03/09/21 6213)    ED Course  I have reviewed the triage vital signs and the nursing notes.  Pertinent labs & imaging results that were available during my care of the patient were reviewed by me and considered in my medical  decision making (see chart for details).    MDM Rules/Calculators/A&P                         65 year old male with ischemic cardiomyopathy secondary to MI, on transplant list, history of sepsis, presents today with generalized weakness, fever, reports of low oxygen saturations.  Here blood pressure is borderline at 98/65.  Chest x-Brandon [name] is significant for improving interstitial opacities and pleural effusions which are compared with most recent study of 2 months ago likely resolving pulmonary edema.,  However cannot rule out infection. Leukocytosis here at 25,000, mild hyponatremia, anemia stable at 10.9 with first prior 12.6 but 10.5 and lower for several months prior to that. Patient reported severe dyspnea with sats 80% on oxygen, now with sats 100% on  Broad-spectrum antibiotics initiated First lactic negative Plan admission for ongoing treatment and evaluation. Discussed withDr. Rogers Blocker who will see for admission  Final Clinical Impression(s) / ED Diagnoses Final diagnoses:  Dyspnea and respiratory abnormalities  CAP (community acquired pneumonia)    Rx / DC Orders ED Discharge Orders     None        Pattricia Boss, MD 03/09/21 1106

## 2021-03-07 NOTE — Progress Notes (Signed)
Pharmacy Antibiotic Note  TRU RANA is a 65 y.o. male admitted on 03/07/2021 with sepsis.  Pharmacy has been consulted for vancomycin and cefepime dosing.  Plan: Vancomycin 1500mg  x1 then 1250mg  IV q12h (eAUC 512, Cr rounded to 0.8mg /dL) Cefepime 2g IV q8h -Monitor renal function, clinical status, and antibiotic plan -Order vanc levels as necessary   Temp (24hrs), Avg:99.3 F (37.4 C), Min:98.4 F (36.9 C), Max:100.1 F (37.8 C)  Recent Labs  Lab 03/07/21 1201 03/07/21 1254  WBC 25.3*  --   CREATININE 0.70  --   LATICACIDVEN  --  1.2    Estimated Creatinine Clearance: 98.3 mL/min (by C-G formula based on SCr of 0.7 mg/dL).    Allergies  Allergen Reactions   Sertraline Other (See Comments)    Made the patient start to feel suicidal    Antimicrobials this admission: Vanc 10/26 >> Cefepime 10/26 >>   Dose adjustments this admission: N/A  Microbiology results: 10/26 BCx: pend 10/26 UCx: pend  Thank you for allowing pharmacy to be a part of this patient's care.  Joetta Manners, PharmD, [facility] Emergency Medicine Clinical Pharmacist ED RPh Phone: [place]: [phone]

## 2021-03-07 NOTE — Consult Note (Addendum)
Advanced Heart Failure Team Consult Note   Primary Physician: [name], MD PCP-Cardiologist:  [name], MD  Reason for Consultation: Shock/Heart Failure   HPI:    Brandon [name] is seen today for evaluation of shock/heart failure  at the request of Dr Jeanell Sparrow.   Brandon [name] is a 65 year old with history of smoker, CAD s/p previous MI, HTN, HL, chronic HFeEF, sepsis 11/2020 bacteremia.   He was admitted 04/27/19 with anterior ST elevation. Taken to cath lab which showed chronically occluded RCA (L>>R collaterals) and thrombotic occlusion of proximal LAD. Underwent PCI of LAD. Echo w/ reduced LVEF 30-35% w/ apical aneurysm. RV ok. Post cath, he required milrinone for cardiogenic shock.   Now s/p Barostim.   Echo 6/22 F 20-25% RV mildly HK. Moderate to severe low-flow AS DI 0.34. Referred for TAVR evaluation. PFTs ordered.   Underwent outpatient [facility] 11/29/20 as part of VAD w/u. He had low filling pressures and CI marginal at 2.3. Post procedure, developed severe rigors, n/v and fever to 102. He was given IVF and started on broad spectrum abx. He was admitted to ICU for suspected sepsis where he then developed acute hypoxic respiratory failure, requiring intubation. Blood Cx + strep. 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  Likely low-flow low gradient AS. Mean gradient 18, RV mild HK. He required inotropic support and eventually  weaned off. Hospitalization also c/b RUE DVT and aspiration pneumonia. He underwent ICD extraction 12/04/20, but Barostim was left as it is extravascular. Per ID, he will complete 6 weeks of IV ceftriaxone. GDMT was limited by hypotension (Coreg and Entresto stopped) and need for midodrine. Home on lasix 40 mg on M/F. He was discharged with LifeVest, PICC & HH PT/RN. D/c weight 184 lbs.   Completed IV antibiotics on 01/16/21. Saw ID and had repeat blood cultures on 01/30/21. Blood cultures negative.     Followed in the HF clinic and was last seen on 02/06/21 . At that time he was stable. LVAD work up was delayed due to bacteremia.   Saw [facility] on 02/23/21. Plan for transplant evaluation at [facility] and then to decide timing of LVAD surgery. [facility] transplant appointment on 03/12/21   Prior to admit taking all medications. He has not missed any doses of xarelto. No chest pain.   Presented to [facility] 03/07/2021 with increased shortness of breath. Over the last 24 hours developed fever and chills. O2 sats in the 80s. Oxygen increased to 5 liters with saturations improved to 90s. CXR compared to previous with resolving pulmonary edema. Pertinent labs include: WBC 25, lactic acid 1.2, K 3.6, creatiine 0.7, hgb 10.9.  Denis SOB.   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 Mean gradient 18. VR = 0.19 VTI = 7.5. RV mild HK   - R/LHC (11/29/20):    Prox Cx lesion is 50% stenosed.   Prox RCA to Mid RCA lesion is 100% stenosed.   Ost RCA to Prox RCA lesion is 50% stenosed.   Non-stenotic Mid LAD lesion was previously treated.  Findings:  Ao = 81/50 (63) LV = 99/12 RA = 4 RV = 24/6 PA = 19/5 (14) PCW = 3 Fick cardiac output/index = 4.7/2.3 PVR = 2.3 WU Ao sat = 99% PA sat = 69%, 70%  Assessment: Stable 2v CAD Severe iCM EF 20% Low filling pressures with moderately  reduced cardiac output Heavily calcified aortic valve (difficult to cross). Appears to be moderate by pressures but may have component of low-gradient, low-flow AS   - Echo (11/01/20): EF 20-25% RV mildly HK. Moderate to severe low-flow AS DI 0.34 Aortic valve mean gradient measures 14.3 mmHg. Aortic valve peak gradient measures 24.3 mmHg. Aortic valve area, by VTI measures 1.44 cm.  - Echo (05/19/20): EF 20-25% RV mildly HK. moderate AS  Mean gradient 13 AVA 1.2 cm2DI 0.30  - Echo (04/27/19): EF 30-35% RV normal.  Moderate HK LV. Grade IDD.    - Echo (10/29/19): EF 25-30% RV ok. Personally reviewed  Review of Systems: [y] = yes, [ ]  = no   General: Weight gain [ ] ; Weight loss [ ] ; Anorexia [ ] ; Fatigue [ ] Y; Fever [ ] ; Chills [ ] ; Weakness [ Y]  Cardiac: Chest pain/pressure [ ] ; Resting SOB [ ] ; Exertional SOB [ Y]; Orthopnea [ ] ; Pedal Edema [ ] ; Palpitations [ ] ; Syncope [ ] ; Presyncope [ ] ; Paroxysmal nocturnal dyspnea[ ]   Pulmonary: Cough [ ] ; Wheezing[ ] ; Hemoptysis[ ] ; Sputum [ ] ; Snoring [ ]   GI: Vomiting[ ] ; Dysphagia[ ] ; Melena[ ] ; Hematochezia [ ] ; Heartburn[ ] ; Abdominal pain [ ] ; Constipation [ ] ; Diarrhea [ ] ; BRBPR [ ]   GU: Hematuria[ ] ; Dysuria [ ] ; Nocturia[ ]   Vascular: Pain in legs with walking [ ] ; Pain in feet with lying flat [ ] ; Non-healing sores [ ] ; Stroke [ ] ; TIA [ ] ; Slurred speech [ ] ;  Neuro: Headaches[ ] ; Vertigo[ ] ; Seizures[ ] ; Paresthesias[ ] ;Blurred vision [ ] ; Diplopia [ ] ; Vision changes [ ]   Ortho/Skin: Arthritis [ ] ; Joint pain [ Y]; Muscle pain [ ] ; Joint swelling [ ] ; Back Pain [ Y]; Rash [ ]   Psych: Depression[ ] ; Anxiety[ ]   Heme: Bleeding problems [ ] ; Clotting disorders [ ] ; Anemia [ ]   Endocrine: Diabetes [ ] ; Thyroid dysfunction[ ]   Home Medications Prior to Admission medications   Medication Sig Start Date End Date Taking? Authorizing Provider  acetaminophen (TYLENOL) 500 MG tablet Take 1,000 mg by mouth every 6 (six) hours as needed (pain.).    [provider]  apixaban (ELIQUIS) 5 MG TABS tablet Take 1 tablet (5 mg total) by mouth 2 (two) times daily. 12/11/20   Clegg, Amy D, NP  atorvastatin (LIPITOR) 80 MG tablet TAKE 1 TABLET BY MOUTH ONCE DAILY AT 6PM. 06/19/20   Hilty, Nadean Corwin, MD  Carboxymethylcellulose Sodium (ARTIFICIAL TEARS OP) Place 1 drop into both eyes 3 (three) times daily as needed (for dryness or irritation).    [provider]  dapagliflozin propanediol (FARXIGA) 10 MG TABS tablet Take 1 tablet (10 mg total) by mouth in the morning. 12/11/20   Clegg,  Amy D, NP  digoxin (LANOXIN) 0.125 MG tablet Take 1 tablet (0.125 mg total) by mouth daily. 12/12/20   Clegg, Amy D, NP  diphenhydrAMINE (BENADRYL) 25 MG tablet Take 50 mg by mouth at bedtime.    [provider]  eplerenone (INSPRA) 50 MG tablet Take 0.5 tablets (25 mg total) by mouth in the morning. 12/11/20   Clegg, Amy D, NP  ezetimibe (ZETIA) 10 MG tablet TAKE ONE TABLET BY MOUTH DAILY 05/30/20   Lelon Perla, MD  furosemide (LASIX) 40 MG tablet Take 1 tablet (40 mg total) by mouth daily. Take extra tab on Monday and Friday 01/18/21   Zedrick Springsteen, Shaune Pascal, MD  gabapentin (NEURONTIN) 100 MG capsule Take 1 capsule (100 mg  total) by mouth 4 (four) times daily. 01/30/21   Jemaine Prokop, Shaune Pascal, MD  ivabradine (CORLANOR) 7.5 MG TABS tablet Take 1 tablet (7.5 mg total) by mouth 2 (two) times daily with a meal. 12/11/20   Clegg, Amy D, NP  melatonin 5 MG TABS Take 1 tablet (5 mg total) by mouth at bedtime. 12/11/20   Clegg, Amy D, NP  pantoprazole (PROTONIX) 40 MG tablet Take 1 tablet (40 mg total) by mouth daily. 01/30/21   Manson Luckadoo, Shaune Pascal, MD  potassium chloride SA (KLOR-CON) 20 MEQ tablet Take 1 tablet (20 mEq total) by mouth daily. 01/18/21   Harmoni Lucus, Shaune Pascal, MD  REMICADE 100 MG injection Inject into the vein every 6 (six) weeks.    [provider]  sertraline (ZOLOFT) 25 MG tablet Take 50 mg by mouth daily.    [provider]  tamsulosin (FLOMAX) 0.4 MG CAPS capsule TAKE 1 CAPSULE(0.4 MG) BY MOUTH DAILY 10/03/20   [name], MD    Past Medical History: Past Medical History:  Diagnosis Date   AICD (automatic cardioverter/defibrillator) present 08/31/2019   Ankylosing spondylitis ([place])    Arthritis    BENIGN PROSTATIC HYPERTROPHY 06/07/2008   CHF (congestive heart failure) ([place])    COLONIC POLYPS, HX OF 06/07/2008   Depression    H/O hiatal hernia    Heart failure ([place])    HYPERLIPIDEMIA 06/07/2008   HYPERTENSION 06/07/2008   Myocardial infarction [facility]) 2005    NSTEMI, s/p LAD stent   NEPHROLITHIASIS, HX OF 06/07/2008   STEMI (ST elevation myocardial infarction) ([place]) 04/27/2019    Past Surgical History: Past Surgical History:  Procedure Laterality Date   COLONOSCOPY WITH PROPOFOL N/A 04/24/2020   Procedure: COLONOSCOPY WITH PROPOFOL;  Surgeon: [name], MD;  Location: [facility];  Service: Gastroenterology;  Laterality: N/A;   CORONARY ANGIOPLASTY WITH STENT PLACEMENT  2005   LAD stent, jailed diagonal   CORONARY/GRAFT ACUTE MI REVASCULARIZATION N/A 04/27/2019   Procedure: Coronary/Graft Acute MI Revascularization;  Surgeon: [name], MD;  Location: [place] [facility];  Service: Cardiovascular;  Laterality: N/A;   ICD IMPLANT  08/31/2019   ICD IMPLANT N/A 08/31/2019   Procedure: ICD IMPLANT;  Surgeon: [name], MD;  Location: [place] [facility];  Service: Cardiovascular;  Laterality: N/A;   ICD LEAD REMOVAL N/A 12/04/2020   Procedure: ICD SYSTEM EXTRACTION;  Surgeon: Vickie Epley, MD;  Location: [place];  Service: Cardiovascular;  Laterality: N/A;   LAMINECTOMY     lumbar   LEFT HEART CATH AND CORONARY ANGIOGRAPHY N/A 04/27/2019   Procedure: LEFT HEART CATH AND CORONARY ANGIOGRAPHY;  Surgeon: [name], MD;  Location: [place] [facility];  Service: Cardiovascular;  Laterality: N/A;   LUMBAR FUSION  01/2012   POLYPECTOMY  04/24/2020   Procedure: POLYPECTOMY;  Surgeon: [name], MD;  Location: [facility];  Service: Gastroenterology;;   RIGHT HEART CATH N/A 04/27/2019   Procedure: RIGHT HEART CATH;  Surgeon: [name], Finch M, MD;  Location: [place] [facility];  Service: Cardiovascular;  Laterality: N/A;   RIGHT/LEFT HEART CATH AND CORONARY ANGIOGRAPHY N/A 11/29/2020   Procedure: RIGHT/LEFT HEART CATH AND CORONARY ANGIOGRAPHY;  Surgeon: Jolaine Artist, MD;  Location: [place] [facility];  Service: Cardiovascular;  Laterality: N/A;   TEE WITHOUT CARDIOVERSION N/A 12/04/2020   Procedure:  TRANSESOPHAGEAL ECHOCARDIOGRAM (TEE);  Surgeon: Vickie Epley, MD;  Location: [place];  Service: Cardiovascular;  Laterality: N/A;   TONSILLECTOMY  warthin tumor removal Left 2014    Family History: Family History  Problem Relation Age of Onset   Depression Father    Arthritis Sister        RA   Heart failure Mother    Other Neg Hx    Colon cancer Neg Hx    Esophageal cancer Neg Hx    Rectal cancer Neg Hx    Stomach cancer Neg Hx    Colon polyps Neg Hx     Social History: Social History   Socioeconomic History   Marital status: Single    Spouse name: Not on file   Number of children: Not on file   Years of education: Not on file   Highest education level: Not on file  Occupational History   Occupation: Best boy: [employer]  Tobacco Use   Smoking status: Former    Packs/day: 1.00    Years: 40.00    Pack years: 40.00    Types: Cigarettes    Quit date: 07/26/2019    Years since quitting: 1.6   Smokeless tobacco: Never   Tobacco comments:    1-1.5 pks per year x 40-45 yrs  Vaping Use   Vaping Use: Never used  Substance and Sexual Activity   Alcohol use: Yes    Alcohol/week: 7.0 standard drinks    Types: 7 Shots of liquor per week    Comment: "2 vodka tonics a night"   Drug use: Yes    Comment: occasionally - CBD oil   Sexual activity: Not on file  Other Topics Concern   Not on file  Social History Narrative   [place] in [place]   Works part time for a [employer] in [place] Strain: Low Risk    Difficulty of Paying Living Expenses: Not very hard  Food Insecurity: No Food Insecurity   Worried About Charity fundraiser in the Last Year: Never true   Arboriculturist in the Last Year: Never true  Transportation Needs: No Transportation Needs   Lack of Transportation (Medical): No   Lack of Transportation (Non-Medical): No  Physical Activity: Sufficiently Active    Days of Exercise per Week: 5 days   Minutes of Exercise per Session: 40 min  Stress: No Stress Concern Present   Feeling of Stress : Not at all  Social Connections: Socially Isolated   Frequency of Communication with Friends and Family: More than three times a week   Frequency of Social Gatherings with Friends and Family: More than three times a week   Attends Religious Services: Never   Marine scientist or Organizations: No   Attends Archivist Meetings: Never   Marital Status: Never married    Allergies:  Allergies  Allergen Reactions   Sertraline Other (See Comments)    Made the patient start to feel suicidal    Objective:    Vital Signs:   Temp:  [98.4 F (36.9 C)-100.1 F (37.8 C)] 100.1 F (37.8 C) (10/26 1231) Pulse Rate:  [94-99] 95 (10/26 1430) Resp:  [10-30] 26 (10/26 1430) BP: (87-105)/(64-73) 98/65 (10/26 1430) SpO2:  [95 %-100 %] 96 % (10/26 1430)    Weight change: There were no vitals filed for this visit.  Intake/Output:  No intake or output data in the 24 hours ending 03/07/21 1552    Physical Exam    General:  . No resp difficulty  HEENT: normal Neck: supple. JVP 6-7. Carotids 2+ bilat; no bruits. No lymphadenopathy or thyromegaly appreciated. Cor: PMI nondisplaced. Tachy regular rate & rhythm. No rubs, gallops or murmurs. Lungs: clear Abdomen: soft, nontender, nondistended. No hepatosplenomegaly. No bruits or masses. Good bowel sounds. Extremities: no cyanosis, clubbing, rash, edema Neuro: alert & orientedx3, cranial nerves grossly intact. moves all 4 extremities w/o difficulty. Affect pleasant   Telemetry   Sinus Tach 100s   EKG      Labs   Basic Metabolic Panel: Recent Labs  Lab 03/07/21 1201 03/07/21 1218  NA 133* 134*  K 3.6 3.5  CL 100  --   CO2 25  --   GLUCOSE 113*  --   BUN 15  --   CREATININE 0.70  --   CALCIUM 8.7*  --     Liver Function Tests: No results for input(s): AST, ALT, ALKPHOS,  BILITOT, PROT, ALBUMIN in the last 168 hours. No results for input(s): LIPASE, AMYLASE in the last 168 hours. No results for input(s): AMMONIA in the last 168 hours.  CBC: Recent Labs  Lab 03/07/21 1201 03/07/21 1218  WBC 25.3*  --   HGB 10.9* 11.6*  HCT 34.4* 34.0*  MCV 80.0  --   PLT 329  --     Cardiac Enzymes: No results for input(s): CKTOTAL, CKMB, CKMBINDEX, TROPONINI in the last 168 hours.  BNP: BNP (last 3 results) Recent Labs    11/30/20 0144 01/08/21 0731 01/18/21 1629  BNP 2,653.1* 734.2* 607.9*    ProBNP (last 3 results) Recent Labs    03/23/20 0922  PROBNP 520*     CBG: No results for input(s): GLUCAP in the last 168 hours.  Coagulation Studies: No results for input(s): LABPROT, INR in the last 72 hours.   Imaging   DG Chest Port 1 View  Result Date: 03/07/2021 CLINICAL DATA:  Shortness of breath since 5 a.m. this morning. Nausea, vomiting and chills. Hypoxemia. EXAM: PORTABLE CHEST 1 VIEW COMPARISON:  Radiographs 01/08/2021 and 12/04/2020.  CT 12/01/2020. FINDINGS: 1207 hours. Left arm PICC has been removed in the interval. Right chest immune later remains in place, with lead projecting into the neck, tip not visualized. The heart size and mediastinal contours are stable. The generalized interstitial prominence seen on previous studies has mildly improved, and the pleural effusions have resolved. There is no confluent airspace opacity or pneumothorax. IMPRESSION: Improved interstitial opacities and pleural effusions compared with most recent study of 2 months ago, most consistent with resolving pulmonary edema. No new findings. Electronically Signed   By: Richardean Sale M.D.   On: 03/07/2021 13:01     Medications:     Current Medications:   Infusions:  ceFEPime (MAXIPIME) IV     vancomycin        Patient Profile   Brandon [name] is a 65 year old with history of smoker, CAD s/p previous MI, HTN, HL, chronic HFeEF, sepsis 11/2020  bacteremia. Admitted with sepsis.   Assessment/Plan  Sepsis -H/o strep bacteremia 11/2020.  ICD extracted 12/04/20. Barostim was not removed as it is extravascular.  -Had been on IV antibiotics until 01/15/21. Blood cultures negative 01/30/21. -WBC 25. Blood CX x2 obtainedBlood cultures negative 01/2021  2. Acute Hypoxic Respiratory Failure  Prior to admit using oxygen as needed. Now on 5 liters oxygen. Saturations stable.   3. Chronic Systolic HFr EF -Echo 10/12/58 EF 20-25% RV mildly HK. moderate AS  Mean gradient 13 AVA 1.2 cm2 DI 0.30 - RHC (7/22):  EF 20%, low filling pressures. CI marginal at 2.3. -He does not appear volume overloaded.  - Hold farxiga , lasix, and eplerenone . Can restart tomorrow it stable.  - No bb with hypotension.  - Potential LVAD patient but this has been on hold due to bacteremia.  4. CAD  s/p anterior STEMI (12/20). LHC showed chronically occluded RCA (with L>>R collaterals) and thrombotic occlusion of proximal LAD. Underwent PCI of LAD. - LHC (7/22) with stable CAD.  No chest pain.   5. H./O RUE DVT On eliquis. Has not missed any doses of eliquis.    Length of Stay: 0  Darrick Grinder, NP  03/07/2021, 3:52 PM  Advanced Heart Failure Team Pager [phone] (M-F; 7a - 5p)  Please contact [facility] for night-coverage after hours (4p -7a ) and weekends on [email or web address]   Patient seen and examined with the above-signed Advanced Practice Provider and/or Housestaff. I personally reviewed laboratory data, imaging studies and relevant notes. I independently examined the patient and formulated the important aspects of the plan. I have edited the note to reflect any of my changes or salient points. I have personally discussed the plan with the patient and/or family.  65 y/o male with CAD, severe systolic HF and recent admit for strep bacteremia with ICD removal and home IV abx.   Has been pending w/u for advanced therapies (VAD vs transplant) - has visit with [name] on  Monday  Now presents with recurrent sepsis with hypotension and WBC 25K. BP improved with IVF  General:  Lying in bed No resp difficulty HEENT: normal Neck: supple. no JVD. Carotids 2+ bilat; no bruits. No lymphadenopathy or thryomegaly appreciated. Cor: PMI nondisplaced. Regular rate & rhythm. No rubs, gallops or murmurs. Lungs: clear Abdomen: soft, nontender, nondistended. No hepatosplenomegaly. No bruits or masses. Good bowel sounds. Extremities: no cyanosis, clubbing, rash, edema Neuro: alert & orientedx3, cranial nerves grossly intact. moves all 4 extremities w/o difficulty. Affect pleasant  He has clear sepsis . High concern for recurrent strep bacteremia. Agree with Bcx. Cover with bioad spectrum abx. Hold HF meds and diuretics.   Glori Bickers, MD  5:50 PM

## 2021-03-07 NOTE — ED Notes (Signed)
Got patient undressed on the monitor did ekg shown to Dr Jeanell Sparrow patient is resting with call bell in reach got patient a warm blanket

## 2021-03-07 NOTE — H&P (Signed)
History and Physical    Brandon [name] KYH:[id number] DOB: 1955/10/02 DOA: 03/07/2021  PCP: [name], MD Consultants:  HF cardiologist: Dr. [name], cardiology: Dr. Stanford Breed, EP: Dr. Rayann Heman Patient coming from:  Home - lives alone   Chief Complaint: sudden onset dyspnea   HPI: Brandon [name] is a 65 y.o. male with medical history significant of CAD with previous MI, chronic systolic CHF/ICM w/ [employer] ICD that was extracted 7/22, moderate aortic stenosis, HTN, HLD, hx of recent right UE DVT who presents with sudden onset dyspnea. He has not been feeling well over the past week with some intermittent nausea, chills and fatigue. He got up this AM and was extremely short of breath. He was gasping for air. He went and got his oxygen and put this on at 2L. Oxygenation was still in the 80s. He states this helped some, but he continued to be short of breath. They had called EMS and when they arrived they put it onto 4L and he felt better with 100% oxygenation.  He has had no fevers. He has had no coughing, diarrhea, dysuria. No skin tears/abrasions.   He has never missed a dose of eliquis.    History of streptococcal bacteremia and septic shock in 11/2020 following a heart cath. He had infection of ICD s/p ICD extraction, now with life vest. He underwent ICD extraction 12/04/20, but Barostim was left as it is extravascular. Finished 6 week course of rocephin on 01/15/21 and was discharged from ID.     ED Course: vitals: Afebrile, blood pressure 105/73, heart rate 98, respiratory rate 20, oxygen 98 to 99% on 4 L nasal cannula. Pertinent labs: COVID and flu negative, WBC 25.3, hemoglobin 10.9, Arterial blood gas: ph: 7.451, lactic acid: 1.2,  CXR: no acute findings.  Improved interstitial opacities and pleural effusions compared to study 2 months ago. In ED: Given cefepime and Vancomycin for sepsis of unknown origin. Cardiology consulted and [facility] was asked to admit.   Review of Systems: As per  HPI; otherwise review of systems reviewed and negative.   Ambulatory Status:  Ambulates without assistance    Past Medical History:  Diagnosis Date   AICD (automatic cardioverter/defibrillator) present 08/31/2019   Ankylosing spondylitis ([place])    Arthritis    BENIGN PROSTATIC HYPERTROPHY 06/07/2008   CHF (congestive heart failure) ([place])    COLONIC POLYPS, HX OF 06/07/2008   Depression    H/O hiatal hernia    Heart failure ([place])    HYPERLIPIDEMIA 06/07/2008   HYPERTENSION 06/07/2008   Myocardial infarction [facility]) 2005   NSTEMI, s/p LAD stent   NEPHROLITHIASIS, HX OF 06/07/2008   STEMI (ST elevation myocardial infarction) ([place]) 04/27/2019    Past Surgical History:  Procedure Laterality Date   COLONOSCOPY WITH PROPOFOL N/A 04/24/2020   Procedure: COLONOSCOPY WITH PROPOFOL;  Surgeon: [name], MD;  Location: [facility];  Service: Gastroenterology;  Laterality: N/A;   CORONARY ANGIOPLASTY WITH STENT PLACEMENT  2005   LAD stent, jailed diagonal   CORONARY/GRAFT ACUTE MI REVASCULARIZATION N/A 04/27/2019   Procedure: Coronary/Graft Acute MI Revascularization;  Surgeon: [name], MD;  Location: [place] [facility];  Service: Cardiovascular;  Laterality: N/A;   ICD IMPLANT  08/31/2019   ICD IMPLANT N/A 08/31/2019   Procedure: ICD IMPLANT;  Surgeon: [name], MD;  Location: [place] [facility];  Service: Cardiovascular;  Laterality: N/A;   ICD LEAD REMOVAL N/A 12/04/2020   Procedure: ICD SYSTEM EXTRACTION;  Surgeon: Lars Mage  T, MD;  Location: [place] OR;  Service: Cardiovascular;  Laterality: N/A;   LAMINECTOMY     lumbar   LEFT HEART CATH AND CORONARY ANGIOGRAPHY N/A 04/27/2019   Procedure: LEFT HEART CATH AND CORONARY ANGIOGRAPHY;  Surgeon: [name], MD;  Location: [place] [facility];  Service: Cardiovascular;  Laterality: N/A;   LUMBAR FUSION  01/2012   POLYPECTOMY  04/24/2020   Procedure: POLYPECTOMY;  Surgeon: [name], MD;   Location: [facility];  Service: Gastroenterology;;   RIGHT HEART CATH N/A 04/27/2019   Procedure: RIGHT HEART CATH;  Surgeon: [name], Kolter M, MD;  Location: [place] [facility];  Service: Cardiovascular;  Laterality: N/A;   RIGHT/LEFT HEART CATH AND CORONARY ANGIOGRAPHY N/A 11/29/2020   Procedure: RIGHT/LEFT HEART CATH AND CORONARY ANGIOGRAPHY;  Surgeon: Jolaine Artist, MD;  Location: [place] [facility];  Service: Cardiovascular;  Laterality: N/A;   TEE WITHOUT CARDIOVERSION N/A 12/04/2020   Procedure: TRANSESOPHAGEAL ECHOCARDIOGRAM (TEE);  Surgeon: Vickie Epley, MD;  Location: [facility] OR;  Service: Cardiovascular;  Laterality: N/A;   TONSILLECTOMY     warthin tumor removal Left 2014    Social History   Socioeconomic History   Marital status: Single    Spouse name: Not on file   Number of children: Not on file   Years of education: Not on file   Highest education level: Not on file  Occupational History   Occupation: Freight forwarder    Employer: [employer]  Tobacco Use   Smoking status: Former    Packs/day: 1.00    Years: 40.00    Pack years: 40.00    Types: Cigarettes    Quit date: 07/26/2019    Years since quitting: 1.6   Smokeless tobacco: Never   Tobacco comments:    1-1.5 pks per year x 40-45 yrs  Vaping Use   Vaping Use: Never used  Substance and Sexual Activity   Alcohol use: Yes    Alcohol/week: 7.0 standard drinks    Types: 7 Shots of liquor per week    Comment: "2 vodka tonics a night"   Drug use: Yes    Comment: occasionally - CBD oil   Sexual activity: Not on file  Other Topics Concern   Not on file  Social History Narrative   Lives in [place] alone   Works part time for a [employer] in [place] Strain: Low Risk    Difficulty of Paying Living Expenses: Not very hard  Food Insecurity: No Food Insecurity   Worried About Charity fundraiser in the Last Year: Never true   Youth worker in the Last Year: Never true  Transportation Needs: No Transportation Needs   Lack of Transportation (Medical): No   Lack of Transportation (Non-Medical): No  Physical Activity: Sufficiently Active   Days of Exercise per Week: 5 days   Minutes of Exercise per Session: 40 min  Stress: No Stress Concern Present   Feeling of Stress : Not at all  Social Connections: Socially Isolated   Frequency of Communication with Friends and Family: More than three times a week   Frequency of Social Gatherings with Friends and Family: More than three times a week   Attends Religious Services: Never   Marine scientist or Organizations: No   Attends Archivist Meetings: Never   Marital Status: Never married  Intimate Partner Violence: Not At Risk  Fear of Current or Ex-Partner: No   Emotionally Abused: No   Physically Abused: No   Sexually Abused: No    Allergies  Allergen Reactions   Sertraline Other (See Comments)    Made the patient start to feel suicidal    Family History  Problem Relation Age of Onset   Depression Father    Arthritis Sister        RA   Heart failure Mother    Other Neg Hx    Colon cancer Neg Hx    Esophageal cancer Neg Hx    Rectal cancer Neg Hx    Stomach cancer Neg Hx    Colon polyps Neg Hx     Prior to Admission medications   Medication Sig Start Date End Date Taking? Authorizing Provider  acetaminophen (TYLENOL) 500 MG tablet Take 1,000 mg by mouth every 6 (six) hours as needed (pain.).    [provider]  apixaban (ELIQUIS) 5 MG TABS tablet Take 1 tablet (5 mg total) by mouth 2 (two) times daily. 12/11/20   Clegg, Amy D, NP  atorvastatin (LIPITOR) 80 MG tablet TAKE 1 TABLET BY MOUTH ONCE DAILY AT 6PM. 06/19/20   Hilty, Nadean Corwin, MD  Carboxymethylcellulose Sodium (ARTIFICIAL TEARS OP) Place 1 drop into both eyes 3 (three) times daily as needed (for dryness or irritation).    [provider]  dapagliflozin propanediol  (FARXIGA) 10 MG TABS tablet Take 1 tablet (10 mg total) by mouth in the morning. 12/11/20   Clegg, Amy D, NP  digoxin (LANOXIN) 0.125 MG tablet Take 1 tablet (0.125 mg total) by mouth daily. 12/12/20   Clegg, Amy D, NP  diphenhydrAMINE (BENADRYL) 25 MG tablet Take 50 mg by mouth at bedtime.    [provider]  eplerenone (INSPRA) 50 MG tablet Take 0.5 tablets (25 mg total) by mouth in the morning. 12/11/20   Clegg, Amy D, NP  ezetimibe (ZETIA) 10 MG tablet TAKE ONE TABLET BY MOUTH DAILY 05/30/20   Lelon Perla, MD  furosemide (LASIX) 40 MG tablet Take 1 tablet (40 mg total) by mouth daily. Take extra tab on Monday and Friday 01/18/21   Bensimhon, Shaune Pascal, MD  gabapentin (NEURONTIN) 100 MG capsule Take 1 capsule (100 mg total) by mouth 4 (four) times daily. 01/30/21   Bensimhon, Shaune Pascal, MD  ivabradine (CORLANOR) 7.5 MG TABS tablet Take 1 tablet (7.5 mg total) by mouth 2 (two) times daily with a meal. 12/11/20   Clegg, Amy D, NP  melatonin 5 MG TABS Take 1 tablet (5 mg total) by mouth at bedtime. 12/11/20   Clegg, Amy D, NP  pantoprazole (PROTONIX) 40 MG tablet Take 1 tablet (40 mg total) by mouth daily. 01/30/21   Bensimhon, Shaune Pascal, MD  potassium chloride SA (KLOR-CON) 20 MEQ tablet Take 1 tablet (20 mEq total) by mouth daily. 01/18/21   Bensimhon, Shaune Pascal, MD  REMICADE 100 MG injection Inject into the vein every 6 (six) weeks.    [provider]  sertraline (ZOLOFT) 25 MG tablet Take 50 mg by mouth daily.    [provider]  tamsulosin (FLOMAX) 0.4 MG CAPS capsule TAKE 1 CAPSULE(0.4 MG) BY MOUTH DAILY 10/03/20   [name], MD    Physical Exam: Vitals:   03/07/21 1345 03/07/21 1400 03/07/21 1415 03/07/21 1430  BP: 101/67 97/67 98/68  98/65  Pulse: 99 95 97 95  Resp: 15 16 16  (!) 26  Temp:      TempSrc:  SpO2: 95% 97% 95% 96%     General:  Appears calm and comfortable and is in NAD Eyes:  PERRL, EOMI, normal lids, iris ENT:  grossly normal hearing, lips &  tongue, mmm; appropriate dentition Neck:  no LAD, masses or thyromegaly; no carotid bruits. NO JVD.  Cardiovascular:  RRR, +systolic murmur.  No LE edema.  Respiratory:   bilateral bibasilar faint crackles, otherwise normal exam with good air movement. Normal respiratory effort. Abdomen:  soft, NT, ND, NABS Back:   normal alignment, no CVAT Skin:  no rash or induration seen on limited exam Musculoskeletal:  grossly normal tone BUE/BLE, good ROM, no bony abnormality Lower extremity:  No LE edema.  Limited foot exam with no ulcerations.  2+ distal pulses. Psychiatric:  grossly normal mood and affect, speech fluent and appropriate, AOx3 Neurologic:  CN 2-12 grossly intact, moves all extremities in coordinated fashion, sensation intact    Radiological Exams on Admission: Independently reviewed - see discussion in A/P where applicable  DG Chest Port 1 View  Result Date: 03/07/2021 CLINICAL DATA:  Shortness of breath since 5 a.m. this morning. Nausea, vomiting and chills. Hypoxemia. EXAM: PORTABLE CHEST 1 VIEW COMPARISON:  Radiographs 01/08/2021 and 12/04/2020.  CT 12/01/2020. FINDINGS: 1207 hours. Left arm PICC has been removed in the interval. Right chest immune later remains in place, with lead projecting into the neck, tip not visualized. The heart size and mediastinal contours are stable. The generalized interstitial prominence seen on previous studies has mildly improved, and the pleural effusions have resolved. There is no confluent airspace opacity or pneumothorax. IMPRESSION: Improved interstitial opacities and pleural effusions compared with most recent study of 2 months ago, most consistent with resolving pulmonary edema. No new findings. Electronically Signed   By: Richardean Sale M.D.   On: 03/07/2021 13:01    EKG: Independently reviewed.  NSR with rate 98, LBBB; nonspecific ST changes with no evidence of acute ischemia   Labs on Admission: I have personally reviewed the available  labs and imaging studies at the time of the admission.  Pertinent labs:   COVID and flu negative,  WBC 25.3,  hemoglobin 10.9, Arterial blood gas: ph: 7.451,  lactic acid: 1.2,     Assessment/Plan Active Problems:   Acute on chronic respiratory failure with hypoxia with SIRS 65 year old male presenting with acute on chronic respiratory failure with hypoxia in the 80s requiring 5 L oxygen from baseline of 2L and gasping for breath in setting of SIRS criteria.  -admit to telemetry  -comfortable on 4L [place] in room, turned down to 3L and maintained oxygenation >96%.  -on chronic eliquis with no missed doses.  PE very low on differential.  -does have moderate to severe AS and severe CHF, but no clinical signs of volume overload or acute CHF exacerbation.  Cardiology consulted.  -meets SIRS criteria and sepsis could be driving this as well. See below.   SIRS  -meets SIRS with WBC of 25.3 and HR to 98. Unknown source at this time.  -continue broad spectrum with cefepime and vanc at this time  -CXR shows improvement, but can't r/o pneumonia. Checking procalcitonin -UA pending -blood culture and urine cultures -history of  streptococcal bacteremia and septic shock in 11/2020 following a heart cath for LVAD evaluation with extraction of ICD as source. BC + for strep gordonii. Completed 6 weeks of rocephin with negative BC on 9/20 and was discharged from ID. 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    Chronic systolic CHF/ICM -appears euvolemic on exam today with no signs of overload -cardiology has been consulted and recommended holding HF medication with hypotension. Only continuing digoxin.  -intake/output and daily weights  -last echo 6/22: EF of 20-25%. Severely decreased LV function. Grade 2 diastolic dysfunction. Moderate to severe AS.  -undergoing evaluation for LVAD/transplant   Upper extremity  DVT -developed during hospitalization in July 2022  -has not missed any doses of his eliquis    CAD (coronary artery disease -stable, on telemetry -s/p STEMI in 12/20. PCI of LAD. LHC showed crhonically occluded RCA and thrombotic occlusion of proximal LAD.  -LHC 7/22: stable  -continue statin    Essential hypertension -soft BP, holding CHF medication     Hyperlipidemia with target LDL less than 70 -continue lipitor    Centrilobular emphysema (HCC)  -stable, on no inhalers.   Normocytic anemia Stable. at baseline.   Ankylosing spondylitis Was on remicade, but stopped this in July.   Anxiety/depression Continue zoloft   There is no height or weight on file to calculate BMI.  Level of care: Telemetry Cardiac DVT prophylaxis:  eliquis  Code Status:  Full - confirmed with patient Family Communication: None present Disposition Plan:  The patient is from: home  Anticipated d/c is to: home  Requires inpatient hospitalization and is at significant risk of worsening, requires constant monitoring, IV antibiotics and assessment and MDM with specialists.      Patient is currently: ill, but stable.   Consults called: cardiology  Admission status:  inpatient   Dragon dictation used in completing this note.   Orma Flaming MD [facility]   How to contact the [facility] Attending or Consulting provider [place] or covering provider during after hours [place], for this patient?  Check the care team in [facility] and look for a) attending/consulting [facility] provider listed and b) the [facility] team listed Log into [email or web address] and use [place]'s universal password to access. If you do not have the password, please contact the hospital operator. Locate the [facility] provider you are looking for under [facility] and page to a number that you can be directly reached. If you still have difficulty reaching the provider, please page the [facility] (Director on Call) for the Hospitalists listed on amion for  assistance.   03/07/2021, 3:51 PM

## 2021-03-07 NOTE — Progress Notes (Signed)
   03/07/21 2019  Assess: MEWS Score  Temp 98.1 F (36.7 C)  BP 110/83  Pulse Rate (!) 109  ECG Heart Rate (!) 110  Resp (!) 22  Level of Consciousness Alert  SpO2 94 %  O2 Device Nasal Cannula  O2 Flow Rate (L/min) 6 L/min  Assess: MEWS Score  MEWS Temp 0  MEWS Systolic 0  MEWS Pulse 1  MEWS RR 1  MEWS LOC 0  MEWS Score 2  MEWS Score Color Yellow  Assess: if the MEWS score is Yellow or Red  Were vital signs taken at a resting state? Yes  Focused Assessment No change from prior assessment  Early Detection of Sepsis Score *See Row Information* Low  MEWS guidelines implemented *See Row Information* Yes  Take Vital Signs  Increase Vital Sign Frequency  Yellow: Q 2hr X 2 then Q 4hr X 2, if remains yellow, continue Q 4hrs  Escalate  MEWS: Escalate Yellow: discuss with charge nurse/RN and consider discussing with provider and RRT  Notify: Charge Nurse/RN  Name of Charge Nurse/RN Notified Lyda Kalata, RN  Date Charge Nurse/RN Notified 03/07/21  Time Charge Nurse/RN Notified 2020  Document  Patient Outcome Other (Comment) (patient stable, just arrived to floor from ED)  Progress note created (see row info) Yes

## 2021-03-07 NOTE — Telephone Encounter (Signed)
Pt called stating he woke up at 5am with chest pain, shortness of breath and chills. Pt was on 2L of O2 and O2 was in the 80's. EMS increased O2 to 4L and pt felt better. EMS advised pt to allow them to take him to the emergency room. Pt declined because he wanted to hear from our office first. Per Adline Potter pt needs to go to the emergency room because he was recently septic and with chills there is a concern for infection. Pt aware and will come to the ED.

## 2021-03-07 NOTE — ED Notes (Signed)
Got patient some water patient is resting with call bell in reach

## 2021-03-07 NOTE — ED Triage Notes (Addendum)
Patient BIB [facility] from home with complaint of shortness of breath since 0500 this morning with nausea, vomiting and chills. Wears 3L O2 PRN but has worn all day today, room air sat in 80% range. [place] O2 increased to 5L with EMS. Denies chest pain and cough. Only pain complaint is chronic arthritis in legs. 20g saline lock in left forearm from EMS. Receive 4mg  zofran PTA, improvement in nausea and vomiting.  EMS vitals BP85/52 CBG 155 HR 88 RR 22 SpO2 98% on 4-5L O2 [place] Oral temp 97.7  Patient reports he is currently being treated for a DVT in right arm.

## 2021-03-07 NOTE — ED Notes (Signed)
MD turned O2 to 3L

## 2021-03-08 DIAGNOSIS — D72829 Elevated white blood cell count, unspecified: Secondary | ICD-10-CM | POA: Diagnosis not present

## 2021-03-08 DIAGNOSIS — I110 Hypertensive heart disease with heart failure: Secondary | ICD-10-CM | POA: Diagnosis present

## 2021-03-08 DIAGNOSIS — I2510 Atherosclerotic heart disease of native coronary artery without angina pectoris: Secondary | ICD-10-CM | POA: Diagnosis not present

## 2021-03-08 DIAGNOSIS — E876 Hypokalemia: Secondary | ICD-10-CM | POA: Diagnosis not present

## 2021-03-08 DIAGNOSIS — F419 Anxiety disorder, unspecified: Secondary | ICD-10-CM | POA: Diagnosis present

## 2021-03-08 DIAGNOSIS — I501 Left ventricular failure: Secondary | ICD-10-CM | POA: Diagnosis not present

## 2021-03-08 DIAGNOSIS — M459 Ankylosing spondylitis of unspecified sites in spine: Secondary | ICD-10-CM | POA: Diagnosis present

## 2021-03-08 DIAGNOSIS — J918 Pleural effusion in other conditions classified elsewhere: Secondary | ICD-10-CM | POA: Diagnosis present

## 2021-03-08 DIAGNOSIS — L89151 Pressure ulcer of sacral region, stage 1: Secondary | ICD-10-CM | POA: Diagnosis present

## 2021-03-08 DIAGNOSIS — I350 Nonrheumatic aortic (valve) stenosis: Secondary | ICD-10-CM | POA: Diagnosis present

## 2021-03-08 DIAGNOSIS — Z7189 Other specified counseling: Secondary | ICD-10-CM | POA: Diagnosis not present

## 2021-03-08 DIAGNOSIS — I5022 Chronic systolic (congestive) heart failure: Secondary | ICD-10-CM | POA: Diagnosis not present

## 2021-03-08 DIAGNOSIS — R109 Unspecified abdominal pain: Secondary | ICD-10-CM | POA: Diagnosis not present

## 2021-03-08 DIAGNOSIS — J9621 Acute and chronic respiratory failure with hypoxia: Secondary | ICD-10-CM | POA: Diagnosis present

## 2021-03-08 DIAGNOSIS — J189 Pneumonia, unspecified organism: Secondary | ICD-10-CM | POA: Diagnosis not present

## 2021-03-08 DIAGNOSIS — I358 Other nonrheumatic aortic valve disorders: Secondary | ICD-10-CM | POA: Diagnosis not present

## 2021-03-08 DIAGNOSIS — M456 Ankylosing spondylitis lumbar region: Secondary | ICD-10-CM | POA: Diagnosis not present

## 2021-03-08 DIAGNOSIS — I272 Pulmonary hypertension, unspecified: Secondary | ICD-10-CM | POA: Diagnosis present

## 2021-03-08 DIAGNOSIS — J849 Interstitial pulmonary disease, unspecified: Secondary | ICD-10-CM | POA: Diagnosis not present

## 2021-03-08 DIAGNOSIS — J432 Centrilobular emphysema: Secondary | ICD-10-CM | POA: Diagnosis present

## 2021-03-08 DIAGNOSIS — Z954 Presence of other heart-valve replacement: Secondary | ICD-10-CM | POA: Diagnosis not present

## 2021-03-08 DIAGNOSIS — Z515 Encounter for palliative care: Secondary | ICD-10-CM | POA: Diagnosis not present

## 2021-03-08 DIAGNOSIS — U071 COVID-19: Secondary | ICD-10-CM | POA: Diagnosis not present

## 2021-03-08 DIAGNOSIS — I330 Acute and subacute infective endocarditis: Secondary | ICD-10-CM | POA: Diagnosis present

## 2021-03-08 DIAGNOSIS — R570 Cardiogenic shock: Secondary | ICD-10-CM | POA: Diagnosis not present

## 2021-03-08 DIAGNOSIS — E871 Hypo-osmolality and hyponatremia: Secondary | ICD-10-CM | POA: Diagnosis present

## 2021-03-08 DIAGNOSIS — I10 Essential (primary) hypertension: Secondary | ICD-10-CM | POA: Diagnosis not present

## 2021-03-08 DIAGNOSIS — I509 Heart failure, unspecified: Secondary | ICD-10-CM | POA: Diagnosis not present

## 2021-03-08 DIAGNOSIS — R531 Weakness: Secondary | ICD-10-CM | POA: Diagnosis not present

## 2021-03-08 DIAGNOSIS — E878 Other disorders of electrolyte and fluid balance, not elsewhere classified: Secondary | ICD-10-CM | POA: Diagnosis present

## 2021-03-08 DIAGNOSIS — F32A Depression, unspecified: Secondary | ICD-10-CM | POA: Diagnosis present

## 2021-03-08 DIAGNOSIS — I319 Disease of pericardium, unspecified: Secondary | ICD-10-CM | POA: Diagnosis present

## 2021-03-08 DIAGNOSIS — I5023 Acute on chronic systolic (congestive) heart failure: Secondary | ICD-10-CM | POA: Diagnosis present

## 2021-03-08 DIAGNOSIS — I351 Nonrheumatic aortic (valve) insufficiency: Secondary | ICD-10-CM | POA: Diagnosis not present

## 2021-03-08 DIAGNOSIS — D509 Iron deficiency anemia, unspecified: Secondary | ICD-10-CM | POA: Diagnosis present

## 2021-03-08 DIAGNOSIS — A419 Sepsis, unspecified organism: Secondary | ICD-10-CM | POA: Diagnosis present

## 2021-03-08 DIAGNOSIS — I255 Ischemic cardiomyopathy: Secondary | ICD-10-CM | POA: Diagnosis not present

## 2021-03-08 DIAGNOSIS — I340 Nonrheumatic mitral (valve) insufficiency: Secondary | ICD-10-CM | POA: Diagnosis not present

## 2021-03-08 DIAGNOSIS — E785 Hyperlipidemia, unspecified: Secondary | ICD-10-CM

## 2021-03-08 DIAGNOSIS — Z95811 Presence of heart assist device: Secondary | ICD-10-CM | POA: Diagnosis not present

## 2021-03-08 DIAGNOSIS — D62 Acute posthemorrhagic anemia: Secondary | ICD-10-CM | POA: Diagnosis not present

## 2021-03-08 DIAGNOSIS — J690 Pneumonitis due to inhalation of food and vomit: Secondary | ICD-10-CM | POA: Diagnosis not present

## 2021-03-08 DIAGNOSIS — J180 Bronchopneumonia, unspecified organism: Secondary | ICD-10-CM | POA: Diagnosis present

## 2021-03-08 DIAGNOSIS — R7881 Bacteremia: Secondary | ICD-10-CM | POA: Diagnosis not present

## 2021-03-08 DIAGNOSIS — R079 Chest pain, unspecified: Secondary | ICD-10-CM | POA: Diagnosis not present

## 2021-03-08 DIAGNOSIS — J90 Pleural effusion, not elsewhere classified: Secondary | ICD-10-CM | POA: Diagnosis not present

## 2021-03-08 DIAGNOSIS — E43 Unspecified severe protein-calorie malnutrition: Secondary | ICD-10-CM | POA: Diagnosis present

## 2021-03-08 DIAGNOSIS — Z952 Presence of prosthetic heart valve: Secondary | ICD-10-CM | POA: Diagnosis not present

## 2021-03-08 DIAGNOSIS — I5043 Acute on chronic combined systolic (congestive) and diastolic (congestive) heart failure: Secondary | ICD-10-CM | POA: Diagnosis not present

## 2021-03-08 LAB — CBC WITH DIFFERENTIAL/PLATELET
Abs Immature Granulocytes: 0.1 10*3/uL — ABNORMAL HIGH (ref 0.00–0.07)
Basophils Absolute: 0 10*3/uL (ref 0.0–0.1)
Basophils Relative: 0 %
Eosinophils Absolute: 0.1 10*3/uL (ref 0.0–0.5)
Eosinophils Relative: 1 %
HCT: 32.2 % — ABNORMAL LOW (ref 39.0–52.0)
Hemoglobin: 10.1 g/dL — ABNORMAL LOW (ref 13.0–17.0)
Immature Granulocytes: 1 %
Lymphocytes Relative: 2 %
Lymphs Abs: 0.4 10*3/uL — ABNORMAL LOW (ref 0.7–4.0)
MCH: 25 pg — ABNORMAL LOW (ref 26.0–34.0)
MCHC: 31.4 g/dL (ref 30.0–36.0)
MCV: 79.7 fL — ABNORMAL LOW (ref 80.0–100.0)
Monocytes Absolute: 0.5 10*3/uL (ref 0.1–1.0)
Monocytes Relative: 3 %
Neutro Abs: 14.9 10*3/uL — ABNORMAL HIGH (ref 1.7–7.7)
Neutrophils Relative %: 93 %
Platelets: 297 10*3/uL (ref 150–400)
RBC: 4.04 MIL/uL — ABNORMAL LOW (ref 4.22–5.81)
RDW: 17.5 % — ABNORMAL HIGH (ref 11.5–15.5)
WBC: 16 10*3/uL — ABNORMAL HIGH (ref 4.0–10.5)
nRBC: 0 % (ref 0.0–0.2)

## 2021-03-08 LAB — RESPIRATORY PANEL BY PCR

## 2021-03-08 LAB — BASIC METABOLIC PANEL
Anion gap: 11 (ref 5–15)
BUN: 19 mg/dL (ref 8–23)
CO2: 20 mmol/L — ABNORMAL LOW (ref 22–32)
Calcium: 8.3 mg/dL — ABNORMAL LOW (ref 8.9–10.3)
Chloride: 97 mmol/L — ABNORMAL LOW (ref 98–111)
Creatinine, Ser: 0.62 mg/dL (ref 0.61–1.24)
GFR, Estimated: >60 mL/min (ref >60)
Glucose, Bld: 142 mg/dL — ABNORMAL HIGH (ref 70–99)
Potassium: 3.2 mmol/L — ABNORMAL LOW (ref 3.5–5.1)
Sodium: 128 mmol/L — ABNORMAL LOW (ref 135–145)

## 2021-03-08 LAB — MRSA NEXT GEN BY PCR, NASAL: MRSA by PCR Next Gen: NOT DETECTED

## 2021-03-08 LAB — PROCALCITONIN: Procalcitonin: 0.22 ng/mL

## 2021-03-08 MED ORDER — POTASSIUM CHLORIDE CRYS ER 20 MEQ PO TBCR: 40.0000 meq | EXTENDED_RELEASE_TABLET | Freq: Once | ORAL | Status: DC

## 2021-03-08 MED ORDER — MIDODRINE HCL 5 MG PO TABS
5.0000 mg | ORAL_TABLET | Freq: Three times a day (TID) | ORAL | Status: DC
  Administered 2021-03-08 – 2021-03-14 (×16): 5 mg via ORAL
  Filled 2021-03-08 (×16): qty 1

## 2021-03-08 MED ORDER — GABAPENTIN 100 MG PO CAPS
200.0000 mg | ORAL_CAPSULE | Freq: Three times a day (TID) | ORAL | Status: DC
  Administered 2021-03-08 – 2021-04-24 (×140): 200 mg via ORAL
  Filled 2021-03-08 (×141): qty 2

## 2021-03-08 MED ORDER — POTASSIUM CHLORIDE CRYS ER 20 MEQ PO TBCR
40.0000 meq | EXTENDED_RELEASE_TABLET | Freq: Once | ORAL | Status: AC
  Administered 2021-03-08: 40 meq via ORAL
  Filled 2021-03-08: qty 2

## 2021-03-08 MED ORDER — IVABRADINE HCL 7.5 MG PO TABS
7.5000 mg | ORAL_TABLET | Freq: Two times a day (BID) | ORAL | Status: DC
  Administered 2021-03-08 – 2021-03-22 (×28): 7.5 mg via ORAL
  Filled 2021-03-08 (×31): qty 1

## 2021-03-08 MED ORDER — POTASSIUM CHLORIDE CRYS ER 20 MEQ PO TBCR
20.0000 meq | EXTENDED_RELEASE_TABLET | Freq: Once | ORAL | Status: AC
  Administered 2021-03-08: 20 meq via ORAL
  Filled 2021-03-08: qty 1

## 2021-03-08 MED ORDER — FUROSEMIDE 10 MG/ML IJ SOLN
40.0000 mg | Freq: Once | INTRAMUSCULAR | Status: AC
  Administered 2021-03-08: 40 mg via INTRAVENOUS
  Filled 2021-03-08: qty 4

## 2021-03-08 NOTE — Plan of Care (Signed)

## 2021-03-08 NOTE — Progress Notes (Signed)
Mobility Specialist Progress Note:   03/08/21 1024  Mobility  Activity Ambulated in hall  Level of Assistance Standby assist, set-up cues, supervision of patient - no hands on  Assistive Device None  Distance Ambulated (ft) 200 ft  Mobility Ambulated with assistance in hallway  Mobility Response Tolerated well  Mobility performed by Mobility specialist  $Mobility charge 1 Mobility   Pre- Mobility: 117 HR; 92% SpO2 During Mobility: 131 HR; 90% SpO2 Post Mobility:  118 HR; 94% SpO2  Pt received in bed willing to participate in mobility. No complaints of pain. Complaints of being a little SOB, requiring 1 standing rest break and 1 seated rest break. Pt returned to bed with call bell in reach and all needs met.   [facility] Health and safety inspector Phone [phone]

## 2021-03-08 NOTE — Progress Notes (Addendum)
PROGRESS NOTE    Brandon [name]  MAU:[id number] DOB: 04-01-1956 DOA: 03/07/2021 PCP: [name], MD    Brief Narrative:  [name] was admitted to the hospital with the working diagnosis of acute on chronic hypoxemic respiratory failure.   65 year old male past medical history for coronary artery disease, systolic heart failure status post AICD, (extracted 7/22 due to infection, completed antibiotic therapy 01/15/2021 with ceftriaxone.), he also has moderate aortic stenosis, hypertension, dyslipidemia, and right upper extremity deep vein thrombosis who presented with acute onset of dyspnea.  Reported not feeling well for about 7 days, intermittent nausea, fatigue and chills.  On the day of hospitalization he had acute onset of severe dyspnea that prompted him to come to the hospital.  His oxygenation at home was 80% despite supplemental oxygen 2 L per nasal cannula.  On his initial physical examination he was afebrile, blood pressure 105/73, heart rate 98, respiratory rate 20, oxygen saturation 98% on 4 L of supplemental oxygen per nasal cannula. He was awake and alert, his lungs had bilateral rales, no wheezing, heart S1-S2, present, rhythmic, positive systolic murmur, abdomen soft nontender, no lower extremity edema.  Sodium 133, potassium 3.6, chloride 100, bicarb 25, glucose 113, BUN 15, creatinine 0.70, ABG pH 7.45, PCO2 37.3, PO2 102, bicarb 27. White count 25.3, hemoglobin 10.9, hematocrit 34.4, platelets 329. SARS COVID-19 negative.  Chest radiograph with no frank infiltrates, mild bilateral hilar vascular congestion.  EKG 98 bpm, normal axis, normal intervals, sinus rhythm with poor R progression, no significant ST segment changes, negative T waves V4-V6.  Possible artifact.  Patient was placed on broad spectrum antibiotic therapy and supplemental 02 per [place].   Assessment & Plan:   Principal Problem:   Acute on chronic respiratory failure with hypoxia (HCC) Active Problems:    Hyperlipidemia with target LDL less than 70   Essential hypertension   CAD (coronary artery disease)   Ischemic cardiomyopathy   Centrilobular emphysema (HCC)   Normocytic anemia   Systemic inflammatory response syndrome (SIRS) (HCC)   Chronic systolic CHF (congestive heart failure) (HCC)   Acute on chronic hypoxemic respiratory failure, possible pneumonia. Sepsis Patient with worsening dyspnea this am with increased oxygen requirements up to 6 L/min per [place] with oxygen saturation 92%.  Possible viral etiology, his procalcitonin is low.  Clinically has JVD  Plan to continue broad spectrum antibiotic for now with vancomycin and cefepime. Check cell count this am.  Avoid further IV fluids and add furosemide 40 mg Iv x1.  If persistent respiratory failure despite diuresis will consider CT chest.  Follow on cultures.  Check respiratory virus panel.  2. Systolic heart failure  Patient tachycardic, blood pressure systolic 96 to 563 mmHg, with warm extremities.   echocardiogram from 89/37 with LV systolic function of 34%, with severe dilatation of the left ventricle. Global hypokinesis. Reduced RV systolic function. Bi-atrial dilatation. Aortic stenosis with low flow gradient.  Considering worsening respiratory failure and JVD I suspect some component of acute heart failure decompensation.  Plan to add furosemide 40 mg Iv x1 and follow response.  Continue with digoxin   3. Dyslipidemia. Continue atorvastatin and ezetimibe.   4. Right upper extremity DVT, with neuropathy. Continue anticoagulation with apixaban. Increase gabapentin to 200 mg tid.   5. Depression. Continue with sertraline.    Patient continue to be at high risk for worsening sepsis.   Status is: Observation  The patient will require care spanning > 2 midnights and should be moved to  inpatient because: Iv antibiotics and IV diuresis.    DVT prophylaxis: Apixaban   Code Status:    full  Family Communication:   No  family at the bedside     Consultants:  Cardiology     Subjective: Patient with severe dyspnea this am, positive nausea but not vomiting, no chest pain. No rashes. Positive neuropathic pain right upper extremity.   Objective: Vitals:   03/07/21 2019 03/07/21 2335 03/08/21 0349 03/08/21 0726  BP: 110/83 96/83 98/67  102/66  Pulse: (!) 109 (!) 113 98 (!) 113  Resp: (!) 22 (!) 28 20 18   Temp: 98.1 F (36.7 C) 98.2 F (36.8 C) 98.1 F (36.7 C) 98.4 F (36.9 C)  TempSrc: Oral Oral Oral Oral  SpO2: 94% 92% 98% 95%  Weight: 76.9 kg  76.6 kg   Height: 6' (1.829 m)       Intake/Output Summary (Last 24 hours) at 03/08/2021 0839 Last data filed at 03/08/2021 0707 Gross per 24 hour  Intake 416.08 ml  Output 650 ml  Net -233.92 ml   Filed Weights   03/07/21 2019 03/08/21 0349  Weight: 76.9 kg 76.6 kg    Examination:   General: Not in pain or dyspnea.  Neurology: Awake and alert, non focal  E ENT: no pallor, no icterus, oral mucosa moist Cardiovascular:  Positive JVD. S1-S2 present, rhythmic, no gallops, rubs. Positive murmur at the apex systolic 3/6. No lower extremity edema. Pulmonary: positive breath sounds bilaterally, with no wheezing, no rhonchi, mild rales. Gastrointestinal. Abdomen soft and non tender Skin. No rashes Musculoskeletal: no joint deformities     Data Reviewed: I have personally reviewed following labs and imaging studies  CBC: Recent Labs  Lab 03/07/21 1201 03/07/21 1218  WBC 25.3*  --   HGB 10.9* 11.6*  HCT 34.4* 34.0*  MCV 80.0  --   PLT 329  --    Basic Metabolic Panel: Recent Labs  Lab 03/07/21 1201 03/07/21 1218  NA 133* 134*  K 3.6 3.5  CL 100  --   CO2 25  --   GLUCOSE 113*  --   BUN 15  --   CREATININE 0.70  --   CALCIUM 8.7*  --    GFR: Estimated Creatinine Clearance: 97.1 mL/min (by C-G formula based on SCr of 0.7 mg/dL). Liver Function Tests: Recent Labs  Lab 03/07/21 2058  AST 20  ALT 19  ALKPHOS 92  BILITOT  1.5*  PROT 6.5  ALBUMIN 2.8*   No results for input(s): LIPASE, AMYLASE in the last 168 hours. No results for input(s): AMMONIA in the last 168 hours. Coagulation Profile: No results for input(s): INR, PROTIME in the last 168 hours. Cardiac Enzymes: No results for input(s): CKTOTAL, CKMB, CKMBINDEX, TROPONINI in the last 168 hours. BNP (last 3 results) Recent Labs    03/23/20 0922  PROBNP 520*   HbA1C: No results for input(s): HGBA1C in the last 72 hours. CBG: No results for input(s): GLUCAP in the last 168 hours. Lipid Profile: No results for input(s): CHOL, HDL, LDLCALC, TRIG, CHOLHDL, LDLDIRECT in the last 72 hours. Thyroid Function Tests: No results for input(s): TSH, T4TOTAL, FREET4, T3FREE, THYROIDAB in the last 72 hours. Anemia Panel: No results for input(s): VITAMINB12, FOLATE, FERRITIN, TIBC, IRON, RETICCTPCT in the last 72 hours.    Radiology Studies: I have reviewed all of the imaging during this hospital visit personally     Scheduled Meds:  apixaban  5 mg Oral BID   atorvastatin  80 mg Oral QPM   digoxin  0.125 mg Oral Daily   diphenhydrAMINE  50 mg Oral QHS   ezetimibe  10 mg Oral Daily   gabapentin  100 mg Oral QID   melatonin  5 mg Oral QHS   pantoprazole  40 mg Oral Daily   potassium chloride SA  20 mEq Oral Daily   sertraline  50 mg Oral Daily   sodium chloride flush  3 mL Intravenous Q12H   tamsulosin  0.4 mg Oral Daily   Continuous Infusions:  sodium chloride 10 mL/hr at 03/08/21 0200   ceFEPime (MAXIPIME) IV Stopped (03/08/21 0056)   vancomycin 1,250 mg (03/08/21 0707)     LOS: 0 days        Edison Wollschlager Gerome Apley, MD

## 2021-03-08 NOTE — Progress Notes (Addendum)
Advanced Heart Failure Rounding Note  PCP-Cardiologist: [name], MD   Subjective:   Admitted with sepsis. Started on cefepime + vanc.   On 8 liters [place] with sats dropping to < 85% while ambulating. Given 40 mg IV lasix x1.   WBC 25>16   Feels terrible. SOB with exertion.  Objective:   Weight Range: 76.6 kg Body mass index is 22.9 kg/m.   Vital Signs:   Temp:  [98.1 F (36.7 C)-100.1 F (37.8 C)] 98.4 F (36.9 C) (10/27 0726) Pulse Rate:  [94-115] 113 (10/27 0726) Resp:  [10-30] 18 (10/27 0726) BP: (87-119)/(63-83) 102/66 (10/27 0726) SpO2:  [84 %-100 %] 95 % (10/27 0726) Weight:  [76.6 kg-76.9 kg] 76.6 kg (10/27 0349) Last BM Date: 03/07/21  Weight change: Filed Weights   03/07/21 2019 03/08/21 0349  Weight: 76.9 kg 76.6 kg    Intake/Output:   Intake/Output Summary (Last 24 hours) at 03/08/2021 1021 Last data filed at 03/08/2021 0707 Gross per 24 hour  Intake 416.08 ml  Output 650 ml  Net -233.92 ml      Physical Exam    General:  No resp difficulty HEENT: Normal Neck: Supple. JVP 6-7  Carotids 2+ bilat; no bruits. No lymphadenopathy or thyromegaly appreciated. Cor: PMI nondisplaced. Regular rate & rhythm. No rubs, gallops or murmurs. Lungs: Clear on 8 liters [place].  Abdomen: Soft, nontender, nondistended. No hepatosplenomegaly. No bruits or masses. Good bowel sounds. Extremities: No cyanosis, clubbing, rash, edema Neuro: Alert & orientedx3, cranial nerves grossly intact. moves all 4 extremities w/o difficulty. Affect pleasant   Telemetry   Sinus Tach 120s   EKG    N/A   Labs    CBC Recent Labs    03/07/21 1201 03/07/21 1218 03/08/21 0925  WBC 25.3*  --  16.0*  NEUTROABS  --   --  14.9*  HGB 10.9* 11.6* 10.1*  HCT 34.4* 34.0* 32.2*  MCV 80.0  --  79.7*  PLT 329  --  223   Basic Metabolic Panel Recent Labs    03/07/21 1201 03/07/21 1218  NA 133* 134*  K 3.6 3.5  CL 100  --   CO2 25  --   GLUCOSE 113*  --   BUN 15  --    CREATININE 0.70  --   CALCIUM 8.7*  --    Liver Function Tests Recent Labs    03/07/21 2058  AST 20  ALT 19  ALKPHOS 92  BILITOT 1.5*  PROT 6.5  ALBUMIN 2.8*   No results for input(s): LIPASE, AMYLASE in the last 72 hours. Cardiac Enzymes No results for input(s): CKTOTAL, CKMB, CKMBINDEX, TROPONINI in the last 72 hours.  BNP: BNP (last 3 results) Recent Labs    11/30/20 0144 01/08/21 0731 01/18/21 1629  BNP 2,653.1* 734.2* 607.9*    ProBNP (last 3 results) Recent Labs    03/23/20 0922  PROBNP 520*     D-Dimer No results for input(s): DDIMER in the last 72 hours. Hemoglobin A1C No results for input(s): HGBA1C in the last 72 hours. Fasting Lipid Panel No results for input(s): CHOL, HDL, LDLCALC, TRIG, CHOLHDL, LDLDIRECT in the last 72 hours. Thyroid Function Tests No results for input(s): TSH, T4TOTAL, T3FREE, THYROIDAB in the last 72 hours.  Invalid input(s): FREET3  Other results:   Imaging    DG Chest Port 1 View  Result Date: 03/07/2021 CLINICAL DATA:  Shortness of breath since 5 a.m. this morning. Nausea, vomiting and chills. Hypoxemia. EXAM: PORTABLE  CHEST 1 VIEW COMPARISON:  Radiographs 01/08/2021 and 12/04/2020.  CT 12/01/2020. FINDINGS: 1207 hours. Left arm PICC has been removed in the interval. Right chest immune later remains in place, with lead projecting into the neck, tip not visualized. The heart size and mediastinal contours are stable. The generalized interstitial prominence seen on previous studies has mildly improved, and the pleural effusions have resolved. There is no confluent airspace opacity or pneumothorax. IMPRESSION: Improved interstitial opacities and pleural effusions compared with most recent study of 2 months ago, most consistent with resolving pulmonary edema. No new findings. Electronically Signed   By: Richardean Sale M.D.   On: 03/07/2021 13:01     Medications:     Scheduled Medications:  apixaban  5 mg Oral BID    atorvastatin  80 mg Oral QPM   digoxin  0.125 mg Oral Daily   diphenhydrAMINE  50 mg Oral QHS   ezetimibe  10 mg Oral Daily   gabapentin  200 mg Oral TID   melatonin  5 mg Oral QHS   pantoprazole  40 mg Oral Daily   potassium chloride SA  20 mEq Oral Daily   sertraline  50 mg Oral Daily   tamsulosin  0.4 mg Oral Daily    Infusions:  ceFEPime (MAXIPIME) IV 2 g (03/08/21 0859)   vancomycin 1,250 mg (03/08/21 0707)    PRN Medications: acetaminophen **OR** acetaminophen, ondansetron **OR** ondansetron (ZOFRAN) IV    Patient Profile   Brandon [name] is a 65 year old with history of smoker, CAD s/p previous MI, HTN, HL, chronic HFeEF, sepsis 11/2020 bacteremia.  Admitted with sepsis. Bld Cx pending.     Assessment/Plan   Sepsis -H/o strep bacteremia 11/2020.  ICD extracted 12/04/20. Barostim was not removed as it is extravascular.  -Had been on IV antibiotics until 01/15/21. Blood cultures negative 01/30/21. -WBC 25-->16. Low grade temp  Procalcitonin 0.22  10/26 -Blood CX x2   NGTD - On Vanc and cefepime   2. Acute Hypoxic Respiratory Failure  Prior to admit using oxygen as needed.  Oxygen requirements increased to 8 liters [place]. Sats dropping < 85% with ambulation.     3. Chronic Systolic HFr EF -Echo 12/12/40 EF 20-25% RV mildly HK. moderate AS  Mean gradient 13 AVA 1.2 cm2 DI 0.30 - RHC (7/22): EF 20%, low filling pressures. CI marginal at 2.3. -He does not appear volume overloaded.  - Hold farxiga , lasix, and eplerenone .  - had dose of IV lasix this am. Assess for response.  - No bb with hypotension.  - restart ivabradine 7.5 mg twice a day.  - Potential LVAD patient but this has been on hold due to bacteremia.   4. CAD  s/p anterior STEMI (12/20). LHC showed chronically occluded RCA (with L>>R collaterals) and thrombotic occlusion of proximal LAD. Underwent PCI of LAD. - LHC (7/22) with stable CAD.  - No chest pain.    5. H./O RUE DVT On eliquis. Has not missed any doses  of eliquis   Length of Stay: 0  Amy Clegg, NP  03/08/2021, 10:21 AM  Advanced Heart Failure Team Pager [phone] (M-F; 7a - 5p)  Please contact [facility] for night-coverage after hours (5p -7a ) and weekends on [email or web address]  Patient seen and examined with the above-signed Advanced Practice Provider and/or Housestaff. I personally reviewed laboratory data, imaging studies and relevant notes. I independently examined the patient and formulated the important aspects of the plan. I have edited the note to  reflect any of my changes or salient points. I have personally discussed the plan with the patient and/or family  He had a rough day today. Remains on IV abx. Continues to require O2 support. Was given lasix today with good output. BP remains soft. Continue on IV abx. BCx NGTD  General:  Sitting in chair Mildly SOB  HEENT: normal Neck: supple. no obviouss JVD. Carotids 2+ bilat; no bruits. No lymphadenopathy or thryomegaly appreciated. Cor: PMI nondisplaced. Regular rate & rhythm. 3/6 AS  Lungs: clear Abdomen: soft, nontender, nondistended. No hepatosplenomegaly. No bruits or masses. Good bowel sounds. Extremities: no cyanosis, clubbing, rash, edema Neuro: alert & orientedx3, cranial nerves grossly intact. moves all 4 extremities w/o difficulty. Affect pleasant  He remains tenuous in setting of recurrent sepsis on top of severe HF and aortic stenosis. [place] still pending. Will add midodrine to help support BP. Continue gentle diuresis. Will repeat echo and may need repeat TEE. Will alert ID that he is readmitted.   I d/w [name] transplant team and they are aware of his admission. Transplant eval currently on hold.   Glori Bickers, MD  6:58 PM

## 2021-03-08 NOTE — Progress Notes (Signed)
I spoke with patient's sister at the bedside, we talked in detail about patient's condition, plan of care and prognosis and all questions were addressed.

## 2021-03-09 ENCOUNTER — Inpatient Hospital Stay (HOSPITAL_COMMUNITY): Payer: Medicare Other

## 2021-03-09 ENCOUNTER — Inpatient Hospital Stay: Payer: Self-pay

## 2021-03-09 DIAGNOSIS — M459 Ankylosing spondylitis of unspecified sites in spine: Secondary | ICD-10-CM

## 2021-03-09 DIAGNOSIS — D72829 Elevated white blood cell count, unspecified: Secondary | ICD-10-CM | POA: Diagnosis not present

## 2021-03-09 DIAGNOSIS — J9621 Acute and chronic respiratory failure with hypoxia: Secondary | ICD-10-CM | POA: Diagnosis not present

## 2021-03-09 LAB — BASIC METABOLIC PANEL
Anion gap: 6 (ref 5–15)
BUN: 21 mg/dL (ref 8–23)
CO2: 24 mmol/L (ref 22–32)
Calcium: 8.7 mg/dL — ABNORMAL LOW (ref 8.9–10.3)
Chloride: 101 mmol/L (ref 98–111)
Creatinine, Ser: 0.6 mg/dL — ABNORMAL LOW (ref 0.61–1.24)
GFR, Estimated: >60 mL/min (ref >60)
Glucose, Bld: 103 mg/dL — ABNORMAL HIGH (ref 70–99)
Potassium: 3.6 mmol/L (ref 3.5–5.1)
Sodium: 131 mmol/L — ABNORMAL LOW (ref 135–145)

## 2021-03-09 LAB — CBC
HCT: 33.4 % — ABNORMAL LOW (ref 39.0–52.0)
Hemoglobin: 10.4 g/dL — ABNORMAL LOW (ref 13.0–17.0)
MCH: 25 pg — ABNORMAL LOW (ref 26.0–34.0)
MCHC: 31.1 g/dL (ref 30.0–36.0)
MCV: 80.3 fL (ref 80.0–100.0)
Platelets: 287 10*3/uL (ref 150–400)
RBC: 4.16 MIL/uL — ABNORMAL LOW (ref 4.22–5.81)
RDW: 17.6 % — ABNORMAL HIGH (ref 11.5–15.5)
WBC: 15.9 10*3/uL — ABNORMAL HIGH (ref 4.0–10.5)
nRBC: 0 % (ref 0.0–0.2)

## 2021-03-09 LAB — TROPONIN I (HIGH SENSITIVITY)
Troponin I (High Sensitivity): 192 ng/L (ref <18)
Troponin I (High Sensitivity): 174 ng/L (ref <18)

## 2021-03-09 LAB — PROCALCITONIN: Procalcitonin: 0.11 ng/mL

## 2021-03-09 LAB — STREP PNEUMONIAE URINARY ANTIGEN: Strep Pneumo Urinary Antigen: NEGATIVE

## 2021-03-09 MED ORDER — FUROSEMIDE 10 MG/ML IJ SOLN
80.0000 mg | Freq: Two times a day (BID) | INTRAMUSCULAR | Status: AC
  Administered 2021-03-09 (×2): 80 mg via INTRAVENOUS
  Filled 2021-03-09 (×2): qty 8

## 2021-03-09 MED ORDER — POTASSIUM CHLORIDE CRYS ER 20 MEQ PO TBCR
40.0000 meq | EXTENDED_RELEASE_TABLET | Freq: Once | ORAL | Status: AC
  Administered 2021-03-09: 40 meq via ORAL
  Filled 2021-03-09: qty 2

## 2021-03-09 NOTE — Progress Notes (Addendum)
0948 - pt had 9 beat run of Vtach.   Darliss Cheney MD notified via secure chat at [phone].   Darrick Grinder NP notified face-to-face at [phone].

## 2021-03-09 NOTE — Progress Notes (Signed)
PROGRESS NOTE    Brandon [name]  QHU:[id number] DOB: 27-Oct-1955 DOA: 03/07/2021 PCP: [name], MD    Brief Narrative:  Mr. Berent was admitted to the hospital with the working diagnosis of acute on chronic hypoxemic respiratory failure.   65 year old male past medical history for coronary artery disease, systolic heart failure status post AICD, (extracted 7/22 due to infection, completed antibiotic therapy 01/15/2021 with ceftriaxone.), he also has moderate aortic stenosis, hypertension, dyslipidemia, and right upper extremity deep vein thrombosis who presented with acute onset of dyspnea.  Reported not feeling well for about 7 days, intermittent nausea, fatigue and chills.  On the day of hospitalization he had acute onset of severe dyspnea that prompted him to come to the hospital.  His oxygenation at home was 80% despite supplemental oxygen 2 L per nasal cannula.  On his initial physical examination he was afebrile, blood pressure 105/73, heart rate 98, respiratory rate 20, oxygen saturation 98% on 4 L of supplemental oxygen per nasal cannula. He was awake and alert, his lungs had bilateral rales, no wheezing, heart S1-S2, present, rhythmic, positive systolic murmur, abdomen soft nontender, no lower extremity edema.  Sodium 133, potassium 3.6, chloride 100, bicarb 25, glucose 113, BUN 15, creatinine 0.70, ABG pH 7.45, PCO2 37.3, PO2 102, bicarb 27. White count 25.3, hemoglobin 10.9, hematocrit 34.4, platelets 329. SARS COVID-19 negative.  Chest radiograph with no frank infiltrates, mild bilateral hilar vascular congestion.  EKG 98 bpm, normal axis, normal intervals, sinus rhythm with poor R progression, no significant ST segment changes, negative T waves V4-V6.  Possible artifact.  Patient was placed on broad spectrum antibiotic therapy and supplemental 02 per [place].   Assessment & Plan:   Principal Problem:   Acute on chronic respiratory failure with hypoxia (HCC) Active Problems:    Hyperlipidemia with target LDL less than 70   Essential hypertension   CAD (coronary artery disease)   Ischemic cardiomyopathy   Centrilobular emphysema (HCC)   Normocytic anemia   Respiratory failure (HCC)   Systemic inflammatory response syndrome (SIRS) (HCC)   Chronic systolic CHF (congestive heart failure) (HCC)   Acute on chronic hypoxemic respiratory failure, possible pneumonia. Sepsis Patient typically uses 2 to 3 L of oxygen at home.  Currently on 9 L and saturating 93% and slow with shortness of breath.  Procalcitonin unremarkable, chest x-ray also not impressive for pneumonia.  I am personally not convinced that he has pneumonia but since he is already on antibiotic, I will complete 5-day course.  Perhaps he has more congestive heart failure component and he is on high dose of Lasix 80 mg IV twice daily.  Heart failure team has consulted ID now.  2.  Chronic systolic heart failure  echocardiogram from 46/56 with LV systolic function of 81%, with severe dilatation of the left ventricle. Global hypokinesis.  Chest x-ray shows component of some pulmonary edema.  Patient has been started on Lasix 80 mg IV twice daily.  He is also on digoxin.  Eplerenone and Farxiga on hold.  No beta-blocker due to hypotension.  Also on ivabradine 7.5 mg twice daily.  Plan was for LVAD but this was on hold due to bacteremia.  3. Dyslipidemia. Continue atorvastatin and ezetimibe.   4. Right upper extremity DVT, with neuropathy. Continue anticoagulation with apixaban.  Gabapentin increased to 200 mg 3 times daily. Increase gabapentin to 200 mg tid.   5. Depression. Continue with sertraline.    6.  BPH: Continue Flomax.  DVT prophylaxis:  Apixaban   Code Status:    full  Family Communication:   No family at the bedside     Consultants:  Cardiology     Subjective:  Patient seen and examined.  He complains of shortness of breath, slightly tachypneic visually but able to speak in full sentences.   Very talkative.  Objective: Vitals:   03/09/21 0836 03/09/21 0900 03/09/21 1000 03/09/21 1301  BP:  112/71 107/79 99/67  Pulse: (!) 118 (!) 119 (!) 105   Resp: (!) 26 (!) 30 (!) 21   Temp:  98 F (36.7 C) 98.1 F (36.7 C) 97.7 F (36.5 C)  TempSrc:  Oral Oral Oral  SpO2: 92% 92% 93%   Weight:      Height:        Intake/Output Summary (Last 24 hours) at 03/09/2021 1414 Last data filed at 03/09/2021 1346 Gross per 24 hour  Intake 1707.26 ml  Output 2750 ml  Net -1042.74 ml    Filed Weights   03/07/21 2019 03/08/21 0349 03/09/21 0644  Weight: 76.9 kg 76.6 kg 80.5 kg    Examination:   General exam: Appears calm and comfortable  Respiratory system: fine crackles at the bases bilaterally. Respiratory effort normal. Cardiovascular system: S1 & S2 heard, RRR. No JVD, murmurs, rubs, gallops or clicks. No pedal edema. Gastrointestinal system: Abdomen is nondistended, soft and nontender. No organomegaly or masses felt. Normal bowel sounds heard. Central nervous system: Alert and oriented. No focal neurological deficits. Extremities: Symmetric 5 x 5 power. Skin: No rashes, lesions or ulcers.  Psychiatry: Judgement and insight appear normal. Mood & affect appropriate.    Data Reviewed: I have personally reviewed following labs and imaging studies  CBC: Recent Labs  Lab 03/07/21 1201 03/07/21 1218 03/08/21 0925 03/09/21 0904  WBC 25.3*  --  16.0* 15.9*  NEUTROABS  --   --  14.9*  --   HGB 10.9* 11.6* 10.1* 10.4*  HCT 34.4* 34.0* 32.2* 33.4*  MCV 80.0  --  79.7* 80.3  PLT 329  --  297 086    Basic Metabolic Panel: Recent Labs  Lab 03/07/21 1201 03/07/21 1218 03/08/21 0925 03/09/21 0414  NA 133* 134* 128* 131*  K 3.6 3.5 3.2* 3.6  CL 100  --  97* 101  CO2 25  --  20* 24  GLUCOSE 113*  --  142* 103*  BUN 15  --  19 21  CREATININE 0.70  --  0.62 0.60*  CALCIUM 8.7*  --  8.3* 8.7*    GFR: Estimated Creatinine Clearance: 98.3 mL/min (A) (by C-G formula based  on SCr of 0.6 mg/dL (L)). Liver Function Tests: Recent Labs  Lab 03/07/21 2058  AST 20  ALT 19  ALKPHOS 92  BILITOT 1.5*  PROT 6.5  ALBUMIN 2.8*    No results for input(s): LIPASE, AMYLASE in the last 168 hours. No results for input(s): AMMONIA in the last 168 hours. Coagulation Profile: No results for input(s): INR, PROTIME in the last 168 hours. Cardiac Enzymes: No results for input(s): CKTOTAL, CKMB, CKMBINDEX, TROPONINI in the last 168 hours. BNP (last 3 results) Recent Labs    03/23/20 0922  PROBNP 520*    HbA1C: No results for input(s): HGBA1C in the last 72 hours. CBG: No results for input(s): GLUCAP in the last 168 hours. Lipid Profile: No results for input(s): CHOL, HDL, LDLCALC, TRIG, CHOLHDL, LDLDIRECT in the last 72 hours. Thyroid Function Tests: No results for input(s): TSH, T4TOTAL, FREET4, T3FREE, THYROIDAB  in the last 72 hours. Anemia Panel: No results for input(s): VITAMINB12, FOLATE, FERRITIN, TIBC, IRON, RETICCTPCT in the last 72 hours.    Radiology Studies: I have reviewed all of the imaging during this hospital visit personally  Scheduled Meds:  apixaban  5 mg Oral BID   atorvastatin  80 mg Oral QPM   digoxin  0.125 mg Oral Daily   diphenhydrAMINE  50 mg Oral QHS   ezetimibe  10 mg Oral Daily   furosemide  80 mg Intravenous BID   gabapentin  200 mg Oral TID   ivabradine  7.5 mg Oral BID WC   melatonin  5 mg Oral QHS   midodrine  5 mg Oral TID WC   pantoprazole  40 mg Oral Daily   sertraline  50 mg Oral Daily   tamsulosin  0.4 mg Oral Daily   Continuous Infusions:  ceFEPime (MAXIPIME) IV 2 g (03/09/21 0835)     LOS: 1 day    Total time spent in minutes: [place], MD [facility] Hospitalist

## 2021-03-09 NOTE — Progress Notes (Signed)
HS troponin elevated at 192.   Denies CP. Had episode of dyspnea and hypoxia w/ increasing O2 requirement overnight. Currently stable.   Will continue to trend HS trop

## 2021-03-09 NOTE — Progress Notes (Addendum)
Advanced Heart Failure Rounding Note  PCP-Cardiologist: [name], MD   Subjective:   Admitted with sepsis. Started on cefepime + vanc.  10/27 started on ivabradine 7.5 mg twice a day and midodrine 5 mg tid.   10/26 Blood culture- NGTD  Remains on 8 liters.   WBC 25>16> CBC pending   Feels terrible. Complaining of nausea/shortness of breath.   Objective:   Weight Range: 80.5 kg Body mass index is 24.07 kg/m.   Vital Signs:   Temp:  [97.8 F (36.6 C)-98.4 F (36.9 C)] 98 F (36.7 C) (10/28 0800) Pulse Rate:  [82-125] 125 (10/28 0800) Resp:  [18-32] 32 (10/28 0800) BP: (78-143)/(55-88) 111/82 (10/28 0800) SpO2:  [90 %-99 %] 93 % (10/28 0800) Weight:  [80.5 kg] 80.5 kg (10/28 0644) Last BM Date: 03/08/21  Weight change: Filed Weights   03/07/21 2019 03/08/21 0349 03/09/21 0644  Weight: 76.9 kg 76.6 kg 80.5 kg    Intake/Output:   Intake/Output Summary (Last 24 hours) at 03/09/2021 0836 Last data filed at 03/09/2021 0800 Gross per 24 hour  Intake 1947.26 ml  Output 2550 ml  Net -602.74 ml      Physical Exam    General:  Appears weak.  HEENT: normal Neck: supple. JVP 10-11  Carotids 2+ bilat; no bruits. No lymphadenopathy or thryomegaly appreciated. Cor: PMI nondisplaced. Tachy regular rate & rhythm. No rubs, gallops or murmurs. Lungs: Crackles 1/2 way up. On 8 liters [place].  Abdomen: soft, nontender, nondistended. No hepatosplenomegaly. No bruits or masses. Good bowel sounds. Extremities: no cyanosis, clubbing, rash, edema Neuro: alert & orientedx3, cranial nerves grossly intact. moves all 4 extremities w/o difficulty. Affect pleasant   Telemetry   ST 90-120s personally checked.   EKG    N/A   Labs    CBC Recent Labs    03/07/21 1201 03/07/21 1218 03/08/21 0925  WBC 25.3*  --  16.0*  NEUTROABS  --   --  14.9*  HGB 10.9* 11.6* 10.1*  HCT 34.4* 34.0* 32.2*  MCV 80.0  --  79.7*  PLT 329  --  921   Basic Metabolic Panel Recent Labs     03/08/21 0925 03/09/21 0414  NA 128* 131*  K 3.2* 3.6  CL 97* 101  CO2 20* 24  GLUCOSE 142* 103*  BUN 19 21  CREATININE 0.62 0.60*  CALCIUM 8.3* 8.7*   Liver Function Tests Recent Labs    03/07/21 2058  AST 20  ALT 19  ALKPHOS 92  BILITOT 1.5*  PROT 6.5  ALBUMIN 2.8*   No results for input(s): LIPASE, AMYLASE in the last 72 hours. Cardiac Enzymes No results for input(s): CKTOTAL, CKMB, CKMBINDEX, TROPONINI in the last 72 hours.  BNP: BNP (last 3 results) Recent Labs    11/30/20 0144 01/08/21 0731 01/18/21 1629  BNP 2,653.1* 734.2* 607.9*    ProBNP (last 3 results) Recent Labs    03/23/20 0922  PROBNP 520*     D-Dimer No results for input(s): DDIMER in the last 72 hours. Hemoglobin A1C No results for input(s): HGBA1C in the last 72 hours. Fasting Lipid Panel No results for input(s): CHOL, HDL, LDLCALC, TRIG, CHOLHDL, LDLDIRECT in the last 72 hours. Thyroid Function Tests No results for input(s): TSH, T4TOTAL, T3FREE, THYROIDAB in the last 72 hours.  Invalid input(s): FREET3  Other results:   Imaging    No results found.   Medications:     Scheduled Medications:  apixaban  5 mg Oral BID  atorvastatin  80 mg Oral QPM   digoxin  0.125 mg Oral Daily   diphenhydrAMINE  50 mg Oral QHS   ezetimibe  10 mg Oral Daily   gabapentin  200 mg Oral TID   ivabradine  7.5 mg Oral BID WC   melatonin  5 mg Oral QHS   midodrine  5 mg Oral TID WC   pantoprazole  40 mg Oral Daily   potassium chloride SA  20 mEq Oral Daily   sertraline  50 mg Oral Daily   tamsulosin  0.4 mg Oral Daily    Infusions:  ceFEPime (MAXIPIME) IV 2 g (03/09/21 0835)   vancomycin 1,250 mg (03/08/21 1749)    PRN Medications: acetaminophen **OR** acetaminophen, ondansetron **OR** ondansetron (ZOFRAN) IV    Patient Profile   Brandon [name] is a 65 year old with history of smoker, CAD s/p previous MI, HTN, HL, chronic HFeEF, sepsis 11/2020 bacteremia.  Admitted with sepsis.  Bld Cx pending.     Assessment/Plan   Sepsis -H/o strep bacteremia 11/2020.  ICD extracted 12/04/20. Barostim was not removed as it is extravascular.  -Had been on IV antibiotics until 01/15/21. Blood cultures negative 01/30/21. -WBC 25-->16-->pending. Afebrile Procalcitonin 0.22  10/26 -Blood CX x2   NGTD - CBC pending.  - On Vanc and cefepime   2. Acute Hypoxic Respiratory Failure  Prior to admit using oxygen as needed.  Oxygen requirements increased to 8 liters [place]. Sats dropping < 85% with ambulation.   - Obtain CXR now.    3. Chronic Systolic HFr EF -Echo 11/13/92 EF 20-25% RV mildly HK. moderate AS  Mean gradient 13 AVA 1.2 cm2 DI 0.30 - RHC (7/22): EF 20%, low filling pressures. CI marginal at 2.3. -Volume status trending up. CXR now.  - Start 80 mg IV lasix twice today. Renal function.  - Hold farxiga , lasix, and eplerenone .  - No bb with hypotension.  - Continue  ivabradine 7.5 mg twice a day.  - Potential LVAD patient but this has been on hold due to bacteremia.   4. CAD  s/p anterior STEMI (12/20). LHC showed chronically occluded RCA (with L>>R collaterals) and thrombotic occlusion of proximal LAD. Underwent PCI of LAD. - LHC (7/22) with stable CAD.  - No chest pain.    5. H./O RUE DVT On eliquis. Has not missed any doses of eliquis    Length of Stay: 1  Amy Clegg, NP  03/09/2021, 8:36 AM  Advanced Heart Failure Team Pager [phone] (M-F; 7a - 5p)  Please contact [facility] for night-coverage after hours (5p -7a ) and weekends on [email or web address]  Patient seen and examined with the above-signed Advanced Practice Provider and/or Housestaff. I personally reviewed laboratory data, imaging studies and relevant notes. I independently examined the patient and formulated the important aspects of the plan. I have edited the note to reflect any of my changes or salient points. I have personally discussed the plan with the patient and/or family.  Was feeling worse this am.  More SOB and uncomfortable. CXR with worsening edema. Treated with IV lasix and now feels much better. SBP running ~ 100. Lactate normal. Afebrile. BCx NGTD. ID has seen and stopped vanc. Continuing cefepime for now.  General:  Sitting up in bed  No resp difficulty HEENT: normal Neck: supple.JVP 8-9 Carotids 2+ bilat; no bruits. No lymphadenopathy or thryomegaly appreciated. Cor: PMI nondisplaced. Regular rate & rhythm. 2/6 AG Lungs: coarse Abdomen: soft, nontender, nondistended. No hepatosplenomegaly. No bruits or  masses. Good bowel sounds. Extremities: no cyanosis, clubbing, rash, edema Neuro: alert & orientedx3, cranial nerves grossly intact. moves all 4 extremities w/o difficulty. Affect pleasant  Remains very tenuous. Initial presentation very concerning for recurrent sepsis with daily chills and WBC 25K. Has responded well to vanc/cefepime but bcx remain negative. This am developed worsening HF which has now responded to IV lasix. Will continue cefepime for now.   Place PICC to better assess output and CVP.   VAD/transplant w/u currently on hold.   Glori Bickers, MD  5:11 PM

## 2021-03-09 NOTE — Progress Notes (Addendum)
Date and time results received: 03/09/21 1444  Test: Troponin I  Critical Value: 192  Name of Provider Notified: Advanced Heart Failure Team Paged at [phone].  Theresia Lo with HF called back [phone]. Awaiting new orders.

## 2021-03-09 NOTE — Progress Notes (Signed)
ID consult requested.   Daleon Willinger NP-C  1:09 PM

## 2021-03-09 NOTE — Consult Note (Signed)
[facility] for Infectious Disease    Date of Admission:  03/07/2021     Reason for Consult: sepsis    Referring Provider: Doristine Bosworth    Abx: 10/26-c cefepime  10/26-28 vancomycin        Assessment: Leukocytosis/sepsis Recent VGS (strep gordonii) bsi; presume ICD involvement s/p removal Ischemic Cardiomyopathy nyhc 3 stage D; LVAD and reimplant ICD candidate  Hypoxic respiratory failure Chronic ankylosing spondylitis with prior uveitis; currently off remicade since 11/2020  65 yo male with cardiomyopathy, recent bacteremia with icd removal 7/22, s/p 6 weeks ceftriaxone on 9/05, hx rue dvt on eliquis admitted 10/26 for acute onset dyspnea the day of admission, and several days malaise, nausea, chills, found to have sepsis/hypoxemic respiratory failure on presentation   #sepsis 10/26 [name] nares screen negative 10/26 bcx ngtd Previous admission 7/20 bcx strep gordonii. 7/21 tee without icd lead vegetation; left ventric EF 20%; normal mv with mild mvr; severely calcified aortic valve with a nodular density on aortic side of valve appears stable from recent CT cannot exclude vegetation in setting bacteremia. Ct chest/abd/pelv during bacteremia workup without focal abscess. He is s/p 6 weeks ceftriaxone by 9/05. Doubt complication or relapse of strep infection This admission, serial cxr had suggested pulmonary edema. He does have sign of sepsis, but lacking in symptomatology outside of dyspnea/malaise. Troponin 190s could be demand ischemia; ekg over 2 days 10/26 & 10/27 rather stable. Consider ischemic burden with leukocytosis but less likely Respiratory viral pcr negative. Other viral process not on panel?[name] Atypical pna can do this but wbc improving without atypical coverage and less likely as procalcintonin normal/down trending Overall unclear if this is sepsis or noninfectious inflammatory process. As below mention tb/fungal/pjp are potential OI for remicade, but low  suspicion given improved leukocytosis without specific treatment. I do not see a need for abd/pelv/chest ct scan to look for adenopathy  #cardiomyopathy ischemic #chf exacerbation #troponinemia I obtained troponin to r/o primary ischemia which could explain some features of his presentation including low grade temp and at times leukocytosis Ekg stable with lateral ekg leads assymetric twave changes ?demand ischemia vs atypical angina  Will defer to our partner cardiology team for further evaluation. Will repeat a trop to make sure it is not trending up   #ankylosing spondylitis #hx uveitis Hx axial/shoulder pain Off remicade since 11/2020 due to bacteremia At risk for disseminated endemic fungi, and potentially tb and pjp with remicade. His interstitial pulmonary prominence at this time appears to be mainly due to chf. No sign or sx of tb/pjp at this time **due to history of uveitis and potentially ankylosing spondylitis can cause interstitial lung disease, he'll need to be back on systemic treatment in near future once infection ruled out   Plan: Stop vancomycin  Continue cefepime -- if tomorrow bcx remains negative and patient clinically stable will stop Check urine strep/legionella antigen Troponin today and repeat if high As leukocytosis improving will not add atypical pna coverage He'll need to follow up with his rheumatologist outpatient; due to serious complication from ankylosing spondylitis would have low threshold to restart tnf-a inhibitor. No contraindication from ID standpoint as he had been well treated for his streptococcal infectious process, and at this time once infection r/o'ed Discussed with heart failure team    I spent 60 minute reviewing data/chart, and coordinating care and >50% direct face to face time providing counseling/discussing diagnostics/treatment plan with patient  ------------------------------------------------ Principal Problem:   Acute on  chronic  respiratory failure with hypoxia (HCC) Active Problems:   Hyperlipidemia with target LDL less than 70   Essential hypertension   CAD (coronary artery disease)   Ischemic cardiomyopathy   Centrilobular emphysema (HCC)   Normocytic anemia   Respiratory failure (HCC)   Systemic inflammatory response syndrome (SIRS) (HCC)   Chronic systolic CHF (congestive heart failure) (HCC)    HPI: Brandon [name] is a 65 y.o. male ankylosing spondylitis previously on tnf-a inhibitor (off since 11/2020), previous AS and calcified aortic valve, s/p icd placement, recent streptococcal bsi s/p icd removal 7/22 and also 6 weeks abx until 9/05, readmitted 10/26 for 2 weeks  malaise/poor appetite, and 1 day acute dyspnea found to have leukocytosis/low grade temperature, as well as hypoxemia/chf exacerbation   Patient doing well/back to baseline since bacteremia treatment. He has oxygen to use prn at home but haven't needed for a month. Startign 2 weeks prior to admission had slow onset fatigue/malaise/poor appetite. He has been sleeping more often. The day of admission he was woken up around 5 am feeling very short of breath. He put his oxygen to 2 liters but no help. Ems called and had to increased o2 supplement to at least 4 liters. His cardiologist asked him to come for evaluation. Has intermittent nausea  Stable back/shoulder pain from anylosing spondylitis  No headache, visual problem, cough, chest pain, abd pain, diarrhea, rash, new arthralgia, myalgia  No sick contact  Lives alone; no recent travel. Has a cleaning lady that comes periodically  No outdoor activities, cleaning of attics/garage or seeing rats/mouse around the house  No legs edema, orthopnea, but report as above being woken up early in am with dyspnea   With regard to his AS, he has stopped remicade 11/2020 and was told by his cardiologist not to restart it. He hasn't seen his rheumatologist in follow up. I presume 11/2020 strep infection for  the reason of stopping. He has prior uveitis. No prior dx'ed interstitial pneumonia from AS yet. As mentioned, stable chronic back/shoulders pain   On admission, temp 100.1, wbc 25s,  hypoxic requiring up to 9 liters o2 bsAbx started Resp viral pcr/bcx negative Procalcitonin serially negative Cxr interstitial prominence Getting diuresed and feeling better overall with breathing/appetite Wbc down trending    Family History  Problem Relation Age of Onset   Depression Father    Arthritis Sister        RA   Heart failure Mother    Other Neg Hx    Colon cancer Neg Hx    Esophageal cancer Neg Hx    Rectal cancer Neg Hx    Stomach cancer Neg Hx    Colon polyps Neg Hx     Social History   Tobacco Use   Smoking status: Former    Packs/day: 1.00    Years: 40.00    Pack years: 40.00    Types: Cigarettes    Quit date: 07/26/2019    Years since quitting: 1.6   Smokeless tobacco: Never   Tobacco comments:    1-1.5 pks per year x 40-45 yrs  Vaping Use   Vaping Use: Never used  Substance Use Topics   Alcohol use: Yes    Alcohol/week: 7.0 standard drinks    Types: 7 Shots of liquor per week    Comment: "2 vodka tonics a night"   Drug use: Yes    Comment: occasionally - CBD oil    No Active Allergies  Review of Systems: ROS All  Other ROS was negative, except mentioned above   Past Medical History:  Diagnosis Date   AICD (automatic cardioverter/defibrillator) present 08/31/2019   Ankylosing spondylitis ([place])    Arthritis    BENIGN PROSTATIC HYPERTROPHY 06/07/2008   CHF (congestive heart failure) ([place])    COLONIC POLYPS, HX OF 06/07/2008   Depression    H/O hiatal hernia    Heart failure ([place])    HYPERLIPIDEMIA 06/07/2008   HYPERTENSION 06/07/2008   Myocardial infarction [facility]) 2005   NSTEMI, s/p LAD stent   NEPHROLITHIASIS, HX OF 06/07/2008   STEMI (ST elevation myocardial infarction) ([place]) 04/27/2019       Scheduled Meds:  apixaban  5 mg Oral BID    atorvastatin  80 mg Oral QPM   digoxin  0.125 mg Oral Daily   diphenhydrAMINE  50 mg Oral QHS   ezetimibe  10 mg Oral Daily   furosemide  80 mg Intravenous BID   gabapentin  200 mg Oral TID   ivabradine  7.5 mg Oral BID WC   melatonin  5 mg Oral QHS   midodrine  5 mg Oral TID WC   pantoprazole  40 mg Oral Daily   sertraline  50 mg Oral Daily   tamsulosin  0.4 mg Oral Daily   Continuous Infusions:  ceFEPime (MAXIPIME) IV 2 g (03/09/21 0835)   vancomycin 1,250 mg (03/09/21 1029)   PRN Meds:.acetaminophen **OR** acetaminophen, ondansetron **OR** ondansetron (ZOFRAN) IV   OBJECTIVE: Blood pressure 107/79, pulse (!) 105, temperature 98.1 F (36.7 C), temperature source Oral, resp. rate (!) 21, height 6' (1.829 m), weight 80.5 kg, SpO2 93 %.  Physical Exam General/constitutional: no distress, pleasant; 8 liters oxygen via [place]; fully conversant HEENT: Normocephalic, PER, Conj Clear, EOMI, Oropharynx clear Neck supple CV: tachy, no mrg, jvp  Lungs: bilateral crackles; normal respiratory effort Abd: Soft, Nontender Ext: no edema Skin: No Rash Neuro: nonfocal MSK: no peripheral joint swelling/tenderness/warmth; back spines nontender Psych alert/oriented   Lab Results Lab Results  Component Value Date   WBC 15.9 (H) 03/09/2021   HGB 10.4 (L) 03/09/2021   HCT 33.4 (L) 03/09/2021   MCV 80.3 03/09/2021   PLT 287 03/09/2021    Lab Results  Component Value Date   CREATININE 0.60 (L) 03/09/2021   BUN 21 03/09/2021   NA 131 (L) 03/09/2021   K 3.6 03/09/2021   CL 101 03/09/2021   CO2 24 03/09/2021    Lab Results  Component Value Date   ALT 19 03/07/2021   AST 20 03/07/2021   ALKPHOS 92 03/07/2021   BILITOT 1.5 (H) 03/07/2021      Microbiology: Recent Results (from the past 240 hour(s))  Resp Panel by RT-PCR (Flu A&B, Covid) Nasopharyngeal Swab     Status: None   Collection Time: 03/07/21 12:00 PM   Specimen: Nasopharyngeal Swab; Nasopharyngeal(NP) swabs in vial  transport medium  Result Value Ref Range Status   SARS Coronavirus 2 by RT PCR NEGATIVE NEGATIVE Final    Comment: (NOTE) SARS-CoV-2 target nucleic acids are NOT DETECTED.  The SARS-CoV-2 RNA is generally detectable in upper respiratory specimens during the acute phase of infection. The lowest concentration of SARS-CoV-2 viral copies this assay can detect is 138 copies/mL. A negative result does not preclude SARS-Cov-2 infection and should not be used as the sole basis for treatment or other patient management decisions. A negative result may occur with  improper specimen collection/handling, submission of specimen other than nasopharyngeal swab, presence of viral mutation(s) within the  areas targeted by this assay, and inadequate number of viral copies(<138 copies/mL). A negative result must be combined with clinical observations, patient history, and epidemiological information. The expected result is Negative.  Fact Sheet for Patients:  EntrepreneurPulse.com.au  Fact Sheet for Healthcare Providers:  IncredibleEmployment.be  This test is no t yet approved or cleared by the [place] FDA and  has been authorized for detection and/or diagnosis of SARS-CoV-2 by FDA under an Emergency Use Authorization (EUA). This EUA will remain  in effect (meaning this test can be used) for the duration of the COVID-19 declaration under Section 564(b)(1) of the Act, 21 U.S.C.section 360bbb-3(b)(1), unless the authorization is terminated  or revoked sooner.       Influenza A by PCR NEGATIVE NEGATIVE Final   Influenza B by PCR NEGATIVE NEGATIVE Final    Comment: (NOTE) The Xpert Xpress SARS-CoV-2/FLU/RSV plus assay is intended as an aid in the diagnosis of influenza from Nasopharyngeal swab specimens and should not be used as a sole basis for treatment. Nasal washings and aspirates are unacceptable for Xpert Xpress SARS-CoV-2/FLU/RSV testing.  Fact  Sheet for Patients: EntrepreneurPulse.com.au  Fact Sheet for Healthcare Providers: IncredibleEmployment.be  This test is not yet approved or cleared by the [place] FDA and has been authorized for detection and/or diagnosis of SARS-CoV-2 by FDA under an Emergency Use Authorization (EUA). This EUA will remain in effect (meaning this test can be used) for the duration of the COVID-19 declaration under Section 564(b)(1) of the Act, 21 U.S.C. section 360bbb-3(b)(1), unless the authorization is terminated or revoked.  Performed at [facility], [place] [address], [place], [place] [address]   Respiratory (~20 pathogens) panel by PCR     Status: None   Collection Time: 03/07/21 12:00 PM   Specimen: Nasopharyngeal Swab; Respiratory  Result Value Ref Range Status   Adenovirus NOT DETECTED NOT DETECTED Final   Coronavirus 229E NOT DETECTED NOT DETECTED Final    Comment: (NOTE) The Coronavirus on the Respiratory Panel, DOES NOT test for the novel  Coronavirus (2019 nCoV)    Coronavirus HKU1 NOT DETECTED NOT DETECTED Final   Coronavirus NL63 NOT DETECTED NOT DETECTED Final   Coronavirus OC43 NOT DETECTED NOT DETECTED Final   Metapneumovirus NOT DETECTED NOT DETECTED Final   Rhinovirus / Enterovirus NOT DETECTED NOT DETECTED Final   Influenza A NOT DETECTED NOT DETECTED Final   Influenza B NOT DETECTED NOT DETECTED Final   Parainfluenza Virus 1 NOT DETECTED NOT DETECTED Final   Parainfluenza Virus 2 NOT DETECTED NOT DETECTED Final   Parainfluenza Virus 3 NOT DETECTED NOT DETECTED Final   Parainfluenza Virus 4 NOT DETECTED NOT DETECTED Final   Respiratory Syncytial Virus NOT DETECTED NOT DETECTED Final   Bordetella pertussis NOT DETECTED NOT DETECTED Final   Bordetella Parapertussis NOT DETECTED NOT DETECTED Final   Chlamydophila pneumoniae NOT DETECTED NOT DETECTED Final   Mycoplasma pneumoniae NOT DETECTED NOT DETECTED Final    Comment:  Performed at [facility], [place]. [address], [place], [place] [address]  Blood culture (routine x 2)     Status: None (Preliminary result)   Collection Time: 03/07/21 12:54 PM   Specimen: BLOOD LEFT WRIST  Result Value Ref Range Status   Specimen Description BLOOD LEFT WRIST  Final   Special Requests   Final    BOTTLES DRAWN AEROBIC AND ANAEROBIC Blood Culture adequate volume   Culture   Final    NO GROWTH 2 DAYS Performed at [facility], [place] [address] [place], [place] [address]    Report Status PENDING  Incomplete  Blood culture (routine x 2)     Status: None (Preliminary result)   Collection Time: 03/07/21  4:41 PM   Specimen: Site Not Specified; Blood  Result Value Ref Range Status   Specimen Description SITE NOT SPECIFIED  Final   Special Requests   Final    BOTTLES DRAWN AEROBIC AND ANAEROBIC Blood Culture results may not be optimal due to an inadequate volume of blood received in culture bottles   Culture   Final    NO GROWTH 2 DAYS Performed at [facility], [place] [address], [place], [place] [address]    Report Status PENDING  Incomplete  [name] Next Gen by PCR, Nasal     Status: None   Collection Time: 03/07/21 10:15 PM   Specimen: Nasal Mucosa; Nasal Swab  Result Value Ref Range Status   [name] by PCR Next Gen NOT DETECTED NOT DETECTED Final    Comment: (NOTE) The GeneXpert [name] Assay (FDA approved for NASAL specimens only), is one component of a comprehensive [name] colonization surveillance program. It is not intended to diagnose [name] infection nor to guide or monitor treatment for [name] infections. Test performance is not FDA approved in patients less than 65 years old. Performed at [facility], [place] [address], [place], [place] [address]      Serology:    Imaging: If present, new imagings (plain films, ct scans, and mri) have been personally visualized and interpreted; radiology reports have been reviewed. Decision making incorporated into  the Impression / Recommendations.  10/28 cxr There is significant interval increase in interstitial and alveolar markings in both lungs, more so in the upper lung fields suggesting pulmonary edema or bilateral pneumonia. There is no significant pleural effusion or pneumothorax.  Jabier Mutton, [facility] [place] [phone] pager    03/09/2021, 1:00 PM

## 2021-03-09 NOTE — Plan of Care (Signed)
  Problem: Health Behavior/Discharge Planning: Goal: Ability to manage health-related needs will improve Outcome: Not Progressing   Problem: Clinical Measurements: Goal: Ability to maintain clinical measurements within normal limits will improve Outcome: Not Progressing Goal: Diagnostic test results will improve Outcome: Not Progressing Goal: Respiratory complications will improve Outcome: Not Progressing   Problem: Activity: Goal: Risk for activity intolerance will decrease Outcome: Not Progressing   Problem: Nutrition: Goal: Adequate nutrition will be maintained Outcome: Not Progressing

## 2021-03-09 NOTE — Plan of Care (Signed)

## 2021-03-10 DIAGNOSIS — J849 Interstitial pulmonary disease, unspecified: Secondary | ICD-10-CM

## 2021-03-10 DIAGNOSIS — I255 Ischemic cardiomyopathy: Secondary | ICD-10-CM

## 2021-03-10 DIAGNOSIS — M456 Ankylosing spondylitis lumbar region: Secondary | ICD-10-CM | POA: Diagnosis not present

## 2021-03-10 DIAGNOSIS — J189 Pneumonia, unspecified organism: Secondary | ICD-10-CM | POA: Diagnosis not present

## 2021-03-10 DIAGNOSIS — D72828 Other elevated white blood cell count: Secondary | ICD-10-CM

## 2021-03-10 DIAGNOSIS — T827XXD Infection and inflammatory reaction due to other cardiac and vascular devices, implants and grafts, subsequent encounter: Secondary | ICD-10-CM

## 2021-03-10 DIAGNOSIS — J9621 Acute and chronic respiratory failure with hypoxia: Secondary | ICD-10-CM | POA: Diagnosis not present

## 2021-03-10 DIAGNOSIS — R7881 Bacteremia: Secondary | ICD-10-CM

## 2021-03-10 LAB — CBC WITH DIFFERENTIAL/PLATELET
Abs Immature Granulocytes: 0.09 10*3/uL — ABNORMAL HIGH (ref 0.00–0.07)
Basophils Absolute: 0 10*3/uL (ref 0.0–0.1)
Basophils Relative: 0 %
Eosinophils Absolute: 0.2 10*3/uL (ref 0.0–0.5)
Eosinophils Relative: 1 %
HCT: 33.7 % — ABNORMAL LOW (ref 39.0–52.0)
Hemoglobin: 10.6 g/dL — ABNORMAL LOW (ref 13.0–17.0)
Immature Granulocytes: 1 %
Lymphocytes Relative: 4 %
Lymphs Abs: 0.5 10*3/uL — ABNORMAL LOW (ref 0.7–4.0)
MCH: 24.8 pg — ABNORMAL LOW (ref 26.0–34.0)
MCHC: 31.5 g/dL (ref 30.0–36.0)
MCV: 78.9 fL — ABNORMAL LOW (ref 80.0–100.0)
Monocytes Absolute: 0.7 10*3/uL (ref 0.1–1.0)
Monocytes Relative: 5 %
Neutro Abs: 12.7 10*3/uL — ABNORMAL HIGH (ref 1.7–7.7)
Neutrophils Relative %: 89 %
Platelets: 333 10*3/uL (ref 150–400)
RBC: 4.27 MIL/uL (ref 4.22–5.81)
RDW: 17.2 % — ABNORMAL HIGH (ref 11.5–15.5)
WBC: 14.2 10*3/uL — ABNORMAL HIGH (ref 4.0–10.5)
nRBC: 0 % (ref 0.0–0.2)

## 2021-03-10 LAB — BASIC METABOLIC PANEL
Anion gap: 9 (ref 5–15)
BUN: 21 mg/dL (ref 8–23)
CO2: 25 mmol/L (ref 22–32)
Calcium: 8.7 mg/dL — ABNORMAL LOW (ref 8.9–10.3)
Chloride: 95 mmol/L — ABNORMAL LOW (ref 98–111)
Creatinine, Ser: 0.67 mg/dL (ref 0.61–1.24)
GFR, Estimated: >60 mL/min (ref >60)
Glucose, Bld: 123 mg/dL — ABNORMAL HIGH (ref 70–99)
Potassium: 3.2 mmol/L — ABNORMAL LOW (ref 3.5–5.1)
Sodium: 129 mmol/L — ABNORMAL LOW (ref 135–145)

## 2021-03-10 LAB — COOXEMETRY PANEL
Carboxyhemoglobin: 1.5 % (ref 0.5–1.5)
Methemoglobin: 0.6 % (ref 0.0–1.5)
O2 Saturation: 48.1 %
Total hemoglobin: 10.6 g/dL — ABNORMAL LOW (ref 12.0–16.0)

## 2021-03-10 MED ORDER — POTASSIUM CHLORIDE CRYS ER 20 MEQ PO TBCR
40.0000 meq | EXTENDED_RELEASE_TABLET | Freq: Two times a day (BID) | ORAL | Status: DC
  Administered 2021-03-10 – 2021-03-13 (×6): 40 meq via ORAL
  Filled 2021-03-10 (×7): qty 2

## 2021-03-10 MED ORDER — MILRINONE LACTATE IN DEXTROSE 20-5 MG/100ML-% IV SOLN
0.1250 ug/kg/min | INTRAVENOUS | Status: DC
  Administered 2021-03-10 – 2021-03-12 (×3): 0.25 ug/kg/min via INTRAVENOUS
  Administered 2021-03-13: 0.125 ug/kg/min via INTRAVENOUS
  Filled 2021-03-10 (×4): qty 100

## 2021-03-10 MED ORDER — POTASSIUM CHLORIDE CRYS ER 20 MEQ PO TBCR
40.0000 meq | EXTENDED_RELEASE_TABLET | Freq: Once | ORAL | Status: AC
  Administered 2021-03-10: 40 meq via ORAL
  Filled 2021-03-10: qty 2

## 2021-03-10 MED ORDER — SODIUM CHLORIDE 0.9% FLUSH
10.0000 mL | Freq: Two times a day (BID) | INTRAVENOUS | Status: DC
  Administered 2021-03-10 – 2021-03-22 (×19): 10 mL
  Administered 2021-03-22: 20 mL
  Administered 2021-03-23: 10 mL
  Administered 2021-03-23: 20 mL
  Administered 2021-03-24 – 2021-03-30 (×11): 10 mL
  Administered 2021-04-01: 30 mL
  Administered 2021-04-02 – 2021-04-04 (×4): 10 mL
  Administered 2021-04-05: 30 mL
  Administered 2021-04-05: 10 mL

## 2021-03-10 MED ORDER — LIDOCAINE 5 % EX PTCH
1.0000 | MEDICATED_PATCH | CUTANEOUS | Status: DC
  Administered 2021-03-10 – 2021-03-23 (×7): 1 via TRANSDERMAL
  Filled 2021-03-10 (×15): qty 1

## 2021-03-10 MED ORDER — CHLORHEXIDINE GLUCONATE CLOTH 2 % EX PADS
6.0000 | MEDICATED_PAD | Freq: Every day | CUTANEOUS | Status: DC
  Administered 2021-03-11 – 2021-04-04 (×24): 6 via TOPICAL

## 2021-03-10 MED ORDER — FUROSEMIDE 10 MG/ML IJ SOLN
80.0000 mg | Freq: Two times a day (BID) | INTRAMUSCULAR | Status: AC
  Administered 2021-03-10 (×2): 80 mg via INTRAVENOUS
  Filled 2021-03-10 (×2): qty 8

## 2021-03-10 MED ORDER — SODIUM CHLORIDE 0.9% FLUSH
10.0000 mL | INTRAVENOUS | Status: DC | PRN
  Administered 2021-03-21: 10 mL

## 2021-03-10 MED ORDER — GUAIFENESIN 100 MG/5ML PO LIQD
5.0000 mL | ORAL | Status: DC | PRN
  Administered 2021-03-19 – 2021-03-24 (×2): 5 mL via ORAL
  Filled 2021-03-10 (×2): qty 5

## 2021-03-10 NOTE — Progress Notes (Signed)
PROGRESS NOTE    Brandon [name]  TKZ:[id number] DOB: 10-26-1955 DOA: 03/07/2021 PCP: [name], MD    Brief Narrative:  Brandon [name] was admitted to the hospital with the working diagnosis of acute on chronic hypoxemic respiratory failure.   65 year old male past medical history for coronary artery disease, systolic heart failure status post AICD, (extracted 7/22 due to infection, completed antibiotic therapy 01/15/2021 with ceftriaxone.), he also has moderate aortic stenosis, hypertension, dyslipidemia, and right upper extremity deep vein thrombosis who presented with acute onset of dyspnea.  Reported not feeling well for about 7 days, intermittent nausea, fatigue and chills.  On the day of hospitalization he had acute onset of severe dyspnea that prompted him to come to the hospital.  His oxygenation at home was 80% despite supplemental oxygen 2 L per nasal cannula.  On his initial physical examination he was afebrile, blood pressure 105/73, heart rate 98, respiratory rate 20, oxygen saturation 98% on 4 L of supplemental oxygen per nasal cannula. He was awake and alert, his lungs had bilateral rales, no wheezing, heart S1-S2, present, rhythmic, positive systolic murmur, abdomen soft nontender, no lower extremity edema.  Sodium 133, potassium 3.6, chloride 100, bicarb 25, glucose 113, BUN 15, creatinine 0.70, ABG pH 7.45, PCO2 37.3, PO2 102, bicarb 27. White count 25.3, hemoglobin 10.9, hematocrit 34.4, platelets 329. SARS COVID-19 negative.  Chest radiograph with no frank infiltrates, mild bilateral hilar vascular congestion.  EKG 98 bpm, normal axis, normal intervals, sinus rhythm with poor R progression, no significant ST segment changes, negative T waves V4-V6.  Possible artifact.  Patient was placed on broad spectrum antibiotic therapy and supplemental 02 per [facility].   Assessment & Plan:   Principal Problem:   Acute on chronic respiratory failure with hypoxia (HCC) Active Problems:    Hyperlipidemia with target LDL less than 70   Essential hypertension   CAD (coronary artery disease)   Ischemic cardiomyopathy   Centrilobular emphysema (HCC)   Normocytic anemia   Respiratory failure (HCC)   Systemic inflammatory response syndrome (SIRS) (HCC)   Chronic systolic CHF (congestive heart failure) (HCC)   Ankylosing spondylitis (HCC)   Leukocytosis   Acute on chronic hypoxemic respiratory failure, possible pneumonia. Sepsis Patient typically uses 2 to 3 L of oxygen at home.  Currently on 9 L and saturating 93% and slow with shortness of breath.  Procalcitonin unremarkable, chest x-ray also not impressive for pneumonia.  I am personally not convinced that he has pneumonia.  He was seen by ID as well.  ID also agrees with this opinion.  Urine streptococcal antigen negative.  Legionella pending.  We discontinued vancomycin yesterday.  Blood culture remain negative and per ID recommendations I will discontinue cefepime as well.  Major cause of his acute on chronic hypoxic respiratory failure appears to be congestive heart failure for which he is on Lasix IV and is being managed by cardiology.  2.  Chronic systolic heart failure  echocardiogram from 57/32 with LV systolic function of 20%, with severe dilatation of the left ventricle. Global hypokinesis.  Chest x-ray shows component of some pulmonary edema.  Patient has been started on Lasix 80 mg IV twice daily.  He is also on digoxin.  Eplerenone and Farxiga on hold.  No beta-blocker due to hypotension.  Also on ivabradine 7.5 mg twice daily.  Patient states that he feels better since yesterday, since he has been started on Lasix.  Plan was for LVAD but this was on hold due  to bacteremia.  3. Dyslipidemia. Continue atorvastatin and ezetimibe.   4. Right upper extremity DVT, with neuropathy. Continue anticoagulation with apixaban.  Gabapentin increased to 200 mg 3 times daily. Increase gabapentin to 200 mg tid.   5. Depression.  Continue with sertraline.    6.  BPH: Continue Flomax.  DVT prophylaxis: Apixaban   Code Status:    full  Family Communication:   No family at the bedside     Consultants:  Cardiology     Subjective:  Patient seen and examined.  He states that his breathing is somewhat better than yesterday.  No new complaint.  Objective: Vitals:   03/09/21 1946 03/09/21 2316 03/10/21 0400 03/10/21 0723  BP: 94/60 103/65 90/66 101/68  Pulse: 91 89 96 97  Resp: (!) 24 (!) 27 (!) 25 20  Temp: 97.9 F (36.6 C) 98.2 F (36.8 C) 98.2 F (36.8 C) 98.7 F (37.1 C)  TempSrc: Oral Oral Oral Oral  SpO2: 95% 95% 95% 95%  Weight:   74.7 kg   Height:        Intake/Output Summary (Last 24 hours) at 03/10/2021 1202 Last data filed at 03/10/2021 1020 Gross per 24 hour  Intake 1080 ml  Output 3250 ml  Net -2170 ml    Filed Weights   03/08/21 0349 03/09/21 0644 03/10/21 0400  Weight: 76.6 kg 80.5 kg 74.7 kg    Examination:   General exam: Appears calm and comfortable  Respiratory system: Rales bilaterally. Respiratory effort normal. Cardiovascular system: S1 & S2 heard, RRR. No JVD, murmurs, rubs, gallops or clicks. No pedal edema. Gastrointestinal system: Abdomen is nondistended, soft and nontender. No organomegaly or masses felt. Normal bowel sounds heard. Central nervous system: Alert and oriented. No focal neurological deficits. Extremities: Symmetric 5 x 5 power. Skin: No rashes, lesions or ulcers.  Psychiatry: Judgement and insight appear normal. Mood & affect appropriate.    Data Reviewed: I have personally reviewed following labs and imaging studies  CBC: Recent Labs  Lab 03/07/21 1201 03/07/21 1218 03/08/21 0925 03/09/21 0904 03/10/21 0901  WBC 25.3*  --  16.0* 15.9* 14.2*  NEUTROABS  --   --  14.9*  --  12.7*  HGB 10.9* 11.6* 10.1* 10.4* 10.6*  HCT 34.4* 34.0* 32.2* 33.4* 33.7*  MCV 80.0  --  79.7* 80.3 78.9*  PLT 329  --  297 287 546    Basic Metabolic  Panel: Recent Labs  Lab 03/07/21 1201 03/07/21 1218 03/08/21 0925 03/09/21 0414 03/10/21 0901  NA 133* 134* 128* 131* 129*  K 3.6 3.5 3.2* 3.6 3.2*  CL 100  --  97* 101 95*  CO2 25  --  20* 24 25  GLUCOSE 113*  --  142* 103* 123*  BUN 15  --  19 21 21   CREATININE 0.70  --  0.62 0.60* 0.67  CALCIUM 8.7*  --  8.3* 8.7* 8.7*    GFR: Estimated Creatinine Clearance: 94.7 mL/min (by C-G formula based on SCr of 0.67 mg/dL). Liver Function Tests: Recent Labs  Lab 03/07/21 2058  AST 20  ALT 19  ALKPHOS 92  BILITOT 1.5*  PROT 6.5  ALBUMIN 2.8*    No results for input(s): LIPASE, AMYLASE in the last 168 hours. No results for input(s): AMMONIA in the last 168 hours. Coagulation Profile: No results for input(s): INR, PROTIME in the last 168 hours. Cardiac Enzymes: No results for input(s): CKTOTAL, CKMB, CKMBINDEX, TROPONINI in the last 168 hours. BNP (last 3 results) Recent  Labs    03/23/20 0922  PROBNP 520*    HbA1C: No results for input(s): HGBA1C in the last 72 hours. CBG: No results for input(s): GLUCAP in the last 168 hours. Lipid Profile: No results for input(s): CHOL, HDL, LDLCALC, TRIG, CHOLHDL, LDLDIRECT in the last 72 hours. Thyroid Function Tests: No results for input(s): TSH, T4TOTAL, FREET4, T3FREE, THYROIDAB in the last 72 hours. Anemia Panel: No results for input(s): VITAMINB12, FOLATE, FERRITIN, TIBC, IRON, RETICCTPCT in the last 72 hours.    Radiology Studies: I have reviewed all of the imaging during this hospital visit personally  Scheduled Meds:  apixaban  5 mg Oral BID   atorvastatin  80 mg Oral QPM   Chlorhexidine Gluconate Cloth  6 each Topical Daily   digoxin  0.125 mg Oral Daily   diphenhydrAMINE  50 mg Oral QHS   ezetimibe  10 mg Oral Daily   furosemide  80 mg Intravenous BID   gabapentin  200 mg Oral TID   ivabradine  7.5 mg Oral BID WC   melatonin  5 mg Oral QHS   midodrine  5 mg Oral TID WC   pantoprazole  40 mg Oral Daily    sertraline  50 mg Oral Daily   sodium chloride flush  10-40 mL Intracatheter Q12H   tamsulosin  0.4 mg Oral Daily   Continuous Infusions:     LOS: 2 days    Total time spent in minutes: 30 Darliss Cheney, MD [place]

## 2021-03-10 NOTE — Progress Notes (Signed)
Peripherally Inserted Central Catheter Placement  The IV Nurse has discussed with the patient and/or persons authorized to consent for the patient, the purpose of this procedure and the potential benefits and risks involved with this procedure.  The benefits include less needle sticks, lab draws from the catheter, and the patient may be discharged home with the catheter. Risks include, but not limited to, infection, bleeding, blood clot (thrombus formation), and puncture of an artery; nerve damage and irregular heartbeat and possibility to perform a PICC exchange if needed/ordered by physician.  Alternatives to this procedure were also discussed.  Bard Power PICC patient education guide, fact sheet on infection prevention and patient information card has been provided to patient /or left at bedside.    PICC Placement Documentation  PICC Double Lumen 89/38/10 PICC Right Basilic 45 cm 0 cm (Active)  Indication for Insertion or Continuance of Line Vasoactive infusions;Chronic illness with exacerbations (CF, Sickle Cell, etc.) 03/10/21 0926  Exposed Catheter (cm) 0 cm 03/10/21 0926  Site Assessment Clean;Dry;Intact 03/10/21 0926  Lumen #1 Status Flushed;Saline locked;Blood return noted 03/10/21 0926  Lumen #2 Status Flushed;Saline locked;Blood return noted 03/10/21 0926  Dressing Type Transparent;Securing device 03/10/21 1751  Dressing Status Clean;Dry;Intact 03/10/21 0926  Antimicrobial disc in place? Yes 03/10/21 0926  Safety Lock Not Applicable 02/58/52 7782  Line Care Connections checked and tightened 03/10/21 0926  Line Adjustment (NICU/IV Team Only) No 03/10/21 0926  Dressing Intervention New dressing 03/10/21 0926  Dressing Change Due 03/17/21 03/10/21 0926       Rolena Infante 03/10/2021, 9:27 AM

## 2021-03-10 NOTE — Progress Notes (Signed)
Subjective: No new complaints   Antibiotics:  Anti-infectives (From admission, onward)    Start     Dose/Rate Route Frequency Ordered Stop   03/08/21 0700  vancomycin (VANCOREADY) IVPB 1250 mg/250 mL  Status:  Discontinued        1,250 mg 166.7 mL/hr over 90 Minutes Intravenous Every 12 hours 03/07/21 1801 03/09/21 1320   03/08/21 0000  ceFEPIme (MAXIPIME) 2 g in sodium chloride 0.9 % 100 mL IVPB  Status:  Discontinued        2 g 200 mL/hr over 30 Minutes Intravenous Every 8 hours 03/07/21 1800 03/10/21 1201   03/07/21 1500  vancomycin (VANCOREADY) IVPB 1500 mg/300 mL        1,500 mg 150 mL/hr over 120 Minutes Intravenous  Once 03/07/21 1455 03/08/21 0118   03/07/21 1500  ceFEPIme (MAXIPIME) 2 g in sodium chloride 0.9 % 100 mL IVPB        2 g 200 mL/hr over 30 Minutes Intravenous  Once 03/07/21 1455 03/07/21 1628       Medications: Scheduled Meds:  apixaban  5 mg Oral BID   atorvastatin  80 mg Oral QPM   Chlorhexidine Gluconate Cloth  6 each Topical Daily   digoxin  0.125 mg Oral Daily   diphenhydrAMINE  50 mg Oral QHS   ezetimibe  10 mg Oral Daily   furosemide  80 mg Intravenous BID   gabapentin  200 mg Oral TID   ivabradine  7.5 mg Oral BID WC   melatonin  5 mg Oral QHS   midodrine  5 mg Oral TID WC   pantoprazole  40 mg Oral Daily   sertraline  50 mg Oral Daily   sodium chloride flush  10-40 mL Intracatheter Q12H   tamsulosin  0.4 mg Oral Daily   Continuous Infusions: PRN Meds:.acetaminophen **OR** acetaminophen, guaiFENesin, ondansetron **OR** ondansetron (ZOFRAN) IV, sodium chloride flush    Objective: Weight change: -5.8 kg  Intake/Output Summary (Last 24 hours) at 03/10/2021 1303 Last data filed at 03/10/2021 1020 Gross per 24 hour  Intake 1080 ml  Output 3250 ml  Net -2170 ml   Blood pressure 101/68, pulse 97, temperature 98.7 F (37.1 C), temperature source Oral, resp. rate 20, height 6' (1.829 m), weight 74.7 kg, SpO2 95 %. Temp:   [97.9 F (36.6 C)-98.7 F (37.1 C)] 98.7 F (37.1 C) (10/29 0723) Pulse Rate:  [89-97] 97 (10/29 0723) Resp:  [20-31] 20 (10/29 0723) BP: (90-103)/(60-72) 101/68 (10/29 0723) SpO2:  [94 %-95 %] 95 % (10/29 0723) Weight:  [74.7 kg] 74.7 kg (10/29 0400)  Physical Exam: Physical Exam Constitutional:      Appearance: He is well-developed. He is diaphoretic.  HENT:     Head: Normocephalic and atraumatic.  Eyes:     Conjunctiva/sclera: Conjunctivae normal.  Cardiovascular:     Rate and Rhythm: Normal rate and regular rhythm.     Heart sounds: No murmur heard.   No friction rub. No gallop.  Pulmonary:     Effort: Pulmonary effort is normal. Prolonged expiration present. No respiratory distress.     Breath sounds: Normal breath sounds. No stridor. No wheezing or rhonchi.  Abdominal:     General: There is no distension.     Palpations: Abdomen is soft.  Musculoskeletal:        General: Normal range of motion.     Cervical back: Normal range of motion and neck supple.  Skin:  General: Skin is warm.     Findings: No erythema or rash.  Neurological:     General: No focal deficit present.     Mental Status: He is alert and oriented to person, place, and time.  Psychiatric:        Mood and Affect: Mood normal.        Behavior: Behavior normal.        Thought Content: Thought content normal.        Judgment: Judgment normal.     CBC:    BMET Recent Labs    03/09/21 0414 03/10/21 0901  NA 131* 129*  K 3.6 3.2*  CL 101 95*  CO2 24 25  GLUCOSE 103* 123*  BUN 21 21  CREATININE 0.60* 0.67  CALCIUM 8.7* 8.7*     Liver Panel  Recent Labs    03/07/21 2058  PROT 6.5  ALBUMIN 2.8*  AST 20  ALT 19  ALKPHOS 92  BILITOT 1.5*  BILIDIR 0.3*  IBILI 1.2*       Sedimentation Rate No results for input(s): ESRSEDRATE in the last 72 hours. C-Reactive Protein No results for input(s): CRP in the last 72 hours.  Micro Results: Recent Results (from the past 720  hour(s))  Resp Panel by RT-PCR (Flu A&B, Covid) Nasopharyngeal Swab     Status: None   Collection Time: 03/07/21 12:00 PM   Specimen: Nasopharyngeal Swab; Nasopharyngeal(NP) swabs in vial transport medium  Result Value Ref Range Status   SARS Coronavirus 2 by RT PCR NEGATIVE NEGATIVE Final    Comment: (NOTE) SARS-CoV-2 target nucleic acids are NOT DETECTED.  The SARS-CoV-2 RNA is generally detectable in upper respiratory specimens during the acute phase of infection. The lowest concentration of SARS-CoV-2 viral copies this assay can detect is 138 copies/mL. A negative result does not preclude SARS-Cov-2 infection and should not be used as the sole basis for treatment or other patient management decisions. A negative result may occur with  improper specimen collection/handling, submission of specimen other than nasopharyngeal swab, presence of viral mutation(s) within the areas targeted by this assay, and inadequate number of viral copies(<138 copies/mL). A negative result must be combined with clinical observations, patient history, and epidemiological information. The expected result is Negative.  Fact Sheet for Patients:  EntrepreneurPulse.com.au  Fact Sheet for Healthcare Providers:  IncredibleEmployment.be  This test is no t yet approved or cleared by the [place] FDA and  has been authorized for detection and/or diagnosis of SARS-CoV-2 by FDA under an Emergency Use Authorization (EUA). This EUA will remain  in effect (meaning this test can be used) for the duration of the COVID-19 declaration under Section 564(b)(1) of the Act, 21 U.S.C.section 360bbb-3(b)(1), unless the authorization is terminated  or revoked sooner.       Influenza A by PCR NEGATIVE NEGATIVE Final   Influenza B by PCR NEGATIVE NEGATIVE Final    Comment: (NOTE) The Xpert Xpress SARS-CoV-2/FLU/RSV plus assay is intended as an aid in the diagnosis of influenza from  Nasopharyngeal swab specimens and should not be used as a sole basis for treatment. Nasal washings and aspirates are unacceptable for Xpert Xpress SARS-CoV-2/FLU/RSV testing.  Fact Sheet for Patients: EntrepreneurPulse.com.au  Fact Sheet for Healthcare Providers: IncredibleEmployment.be  This test is not yet approved or cleared by the [place] FDA and has been authorized for detection and/or diagnosis of SARS-CoV-2 by FDA under an Emergency Use Authorization (EUA). This EUA will remain in effect (meaning this test can be  used) for the duration of the COVID-19 declaration under Section 564(b)(1) of the Act, 21 U.S.C. section 360bbb-3(b)(1), unless the authorization is terminated or revoked.  Performed at [facility], [place] [address] [place], [place] [address]   Respiratory (~20 pathogens) panel by PCR     Status: None   Collection Time: 03/07/21 12:00 PM   Specimen: Nasopharyngeal Swab; Respiratory  Result Value Ref Range Status   Adenovirus NOT DETECTED NOT DETECTED Final   Coronavirus 229E NOT DETECTED NOT DETECTED Final    Comment: (NOTE) The Coronavirus on the Respiratory Panel, DOES NOT test for the novel  Coronavirus (2019 nCoV)    Coronavirus HKU1 NOT DETECTED NOT DETECTED Final   Coronavirus NL63 NOT DETECTED NOT DETECTED Final   Coronavirus OC43 NOT DETECTED NOT DETECTED Final   Metapneumovirus NOT DETECTED NOT DETECTED Final   Rhinovirus / Enterovirus NOT DETECTED NOT DETECTED Final   Influenza A NOT DETECTED NOT DETECTED Final   Influenza B NOT DETECTED NOT DETECTED Final   Parainfluenza Virus 1 NOT DETECTED NOT DETECTED Final   Parainfluenza Virus 2 NOT DETECTED NOT DETECTED Final   Parainfluenza Virus 3 NOT DETECTED NOT DETECTED Final   Parainfluenza Virus 4 NOT DETECTED NOT DETECTED Final   Respiratory Syncytial Virus NOT DETECTED NOT DETECTED Final   Bordetella pertussis NOT DETECTED NOT DETECTED Final    Bordetella Parapertussis NOT DETECTED NOT DETECTED Final   Chlamydophila pneumoniae NOT DETECTED NOT DETECTED Final   Mycoplasma pneumoniae NOT DETECTED NOT DETECTED Final    Comment: Performed at [facility], [place]. [address], [place], [place] [address]  Blood culture (routine x 2)     Status: None (Preliminary result)   Collection Time: 03/07/21 12:54 PM   Specimen: BLOOD LEFT WRIST  Result Value Ref Range Status   Specimen Description BLOOD LEFT WRIST  Final   Special Requests   Final    BOTTLES DRAWN AEROBIC AND ANAEROBIC Blood Culture adequate volume   Culture   Final    NO GROWTH 3 DAYS Performed at [facility], [place] [address] [place], [place] [address]    Report Status PENDING  Incomplete  Blood culture (routine x 2)     Status: None (Preliminary result)   Collection Time: 03/07/21  4:41 PM   Specimen: Site Not Specified; Blood  Result Value Ref Range Status   Specimen Description SITE NOT SPECIFIED  Final   Special Requests   Final    BOTTLES DRAWN AEROBIC AND ANAEROBIC Blood Culture results may not be optimal due to an inadequate volume of blood received in culture bottles   Culture   Final    NO GROWTH 3 DAYS Performed at [facility], [place] [address] [place], [place] [address]    Report Status PENDING  Incomplete  [name] Next Gen by PCR, Nasal     Status: None   Collection Time: 03/07/21 10:15 PM   Specimen: Nasal Mucosa; Nasal Swab  Result Value Ref Range Status   [name] by PCR Next Gen NOT DETECTED NOT DETECTED Final    Comment: (NOTE) The GeneXpert [name] Assay (FDA approved for NASAL specimens only), is one component of a comprehensive [name] colonization surveillance program. It is not intended to diagnose [name] infection nor to guide or monitor treatment for [name] infections. Test performance is not FDA approved in patients less than 70 years old. Performed at [facility], [place] [address] [place], [place] [address]  Studies/Results: DG  CHEST PORT 1 VIEW  Result Date: 03/09/2021 CLINICAL DATA:  Shortness of breath EXAM: PORTABLE CHEST 1 VIEW COMPARISON:  Previous studies including the examination of 03/07/2021 FINDINGS: Transverse diameter of heart is within normal limits. There is significant interval increase in interstitial and alveolar densities in both lungs, more so in the upper lung fields. Lateral CP angles are essentially clear. There is no pneumothorax. There is an electronic device in the right chest wall with lead extending to the neck. IMPRESSION: There is significant interval increase in interstitial and alveolar markings in both lungs, more so in the upper lung fields suggesting pulmonary edema or bilateral pneumonia. There is no significant pleural effusion or pneumothorax. Reading location: [place], [place]. Electronically Signed   By: Elmer Picker M.D.   On: 03/09/2021 09:20   [place] EKG SITE RITE  Result Date: 03/09/2021 If Site Rite image not attached, placement could not be confirmed due to current cardiac rhythm.     Assessment/Plan:  INTERVAL HISTORY:   Cefepime has been removed  Blood cultures remain negative patient is afebrile   Principal Problem:   Acute on chronic respiratory failure with hypoxia (HCC) Active Problems:   Hyperlipidemia with target LDL less than 70   Essential hypertension   CAD (coronary artery disease)   Ischemic cardiomyopathy   Centrilobular emphysema (HCC)   Normocytic anemia   Respiratory failure (HCC)   Systemic inflammatory response syndrome (SIRS) (HCC)   Chronic systolic CHF (congestive heart failure) (HCC)   Ankylosing spondylitis ([place])   Leukocytosis    Brandon [name] is a 65 y.o. male whom I am quite familiar with from his prior admission.  He has a history of ankylosing spondylitis having been previously on Biologics also advanced heart failure  Aortic stenosis, with history of barostim and  ICD with the latter having been removed during a recent  admission in July with to coccus gordonae  bacteremia.    His hospital stayi n July  that time was complicated by septic and cardiogenic shock.  His transesophageal echocardiogram that time it shown what could be a fibroblastoma versus a vegetation.  CT chest abdomen and pelvis at that time failed to show evidence of a source for his bacteremia Panorex was not possible to do at the time.  CT maxillofacial though failed to show any evidence of significant pathology in his oropharynx.  I saw him in clinic for follow-up and took surveillance blood cultures after he had been off antibiotics for greater than 2 weeks and these were reassuringly negative.  He is remained off immunosuppressive therapy and is in the process of being evaluated by [facility] for possible heart transplant, versus LVAD.  He has now been admitted with acute on chronic respiratory failure and apparent cardiogenic shock.   He had blood cultures taken and was placed on vancomycin and cefepime.  Cultures have been negative so far and vancomycin was stopped yesterday with cefepime having can stop today.  Chest x-ray does not show lobar pneumonia but does show interstitial infiltrates which could be pulmonary edema.  My partner Dr. Gale Journey was also concerned about the risk of interstitial lung disease in the context of untreated ankylosing spondylitis.   #1  Acute on chronic respiratory failure:  Presumably most of this is due to worsening of his heart failure.  The idea that it could be an interstitial lung disease developing in the context of untreated AS is certainly something that 1 should consider and I would  ask for critical care pulmonary medicine's opinion on this.  Obviously the treatment for that would be corticosteroids.  We are going to continue to observe him off antibiotics at this point.  #2 ? Pneumonia: Seems more consistent with heart failure versus interstitial lung disease.  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  Blood cultures off antibiotics in my clinic were reassuring.  I spent 40 minutes with the patient including face to face counseling of the patient or in the differential for his acute on chronic respiratory failure, his advanced heart disease his history of bacteremia personally reviewing his chest x-ray, updated blood cultures CBC CMP along with personally reviewing radiographs, along with pertinent laboratory microbiological, virological data review of medical records before and during the visit and in coordination of his care with primary team.     LOS: 2 days   Rhina Brackett [place] 03/10/2021, 1:03 PM

## 2021-03-10 NOTE — Progress Notes (Signed)
Significan t squelching of external ECG reading during PICC placement.  P wave progression noted and documented throughout SVC.  3 ECG patterns in echart to show p wave progression.  PICC trimmed at 45 cm length and left at 0 cm marking.

## 2021-03-10 NOTE — Progress Notes (Signed)
Patient ID: SANTIEL TOPPER, male   DOB: May 02, 1956, 65 y.o.   MRN: [id number]     Advanced Heart Failure Rounding Note  PCP-Cardiologist: [name], MD   Subjective:    Admitted with sepsis. Started on cefepime + vanc, now just cefepime (ID following).  10/27 started on ivabradine 7.5 mg twice a day and midodrine 5 mg tid.   Remains on 8 liters HFNC.  Good diuresis yesterday with IV Lasix, says his breathing is better but oxygen saturation still drops when he stands up and walks a short distance.  No labs yet this morning.   PICC line just put in.   Objective:   Weight Range: 74.7 kg Body mass index is 22.34 kg/m.   Vital Signs:   Temp:  [97.7 F (36.5 C)-98.7 F (37.1 C)] 98.7 F (37.1 C) (10/29 0723) Pulse Rate:  [89-105] 97 (10/29 0723) Resp:  [20-31] 20 (10/29 0723) BP: (90-107)/(60-79) 101/68 (10/29 0723) SpO2:  [93 %-98 %] 95 % (10/29 0723) Weight:  [74.7 kg] 74.7 kg (10/29 0400) Last BM Date: 03/09/21  Weight change: Filed Weights   03/08/21 0349 03/09/21 0644 03/10/21 0400  Weight: 76.6 kg 80.5 kg 74.7 kg    Intake/Output:   Intake/Output Summary (Last 24 hours) at 03/10/2021 0924 Last data filed at 03/10/2021 0726 Gross per 24 hour  Intake 600 ml  Output 3550 ml  Net -2950 ml      Physical Exam    General: NAD Neck: JVP 10 cm, no thyromegaly or thyroid nodule.  Lungs: Crackles at bases.  CV: Nondisplaced PMI.  Heart regular S1/S2, no S3/S4, no murmur.  No peripheral edema.   Abdomen: Soft, nontender, no hepatosplenomegaly, no distention.  Skin: Intact without lesions or rashes.  Neurologic: Alert and oriented x 3.  Psych: Normal affect. Extremities: No clubbing or cyanosis.  HEENT: Normal.    Telemetry   ST 100s personally checked.   EKG    N/A   Labs    CBC Recent Labs    03/08/21 0925 03/09/21 0904  WBC 16.0* 15.9*  NEUTROABS 14.9*  --   HGB 10.1* 10.4*  HCT 32.2* 33.4*  MCV 79.7* 80.3  PLT 297 295   Basic Metabolic  Panel Recent Labs    03/08/21 0925 03/09/21 0414  NA 128* 131*  K 3.2* 3.6  CL 97* 101  CO2 20* 24  GLUCOSE 142* 103*  BUN 19 21  CREATININE 0.62 0.60*  CALCIUM 8.3* 8.7*   Liver Function Tests Recent Labs    03/07/21 2058  AST 20  ALT 19  ALKPHOS 92  BILITOT 1.5*  PROT 6.5  ALBUMIN 2.8*   No results for input(s): LIPASE, AMYLASE in the last 72 hours. Cardiac Enzymes No results for input(s): CKTOTAL, CKMB, CKMBINDEX, TROPONINI in the last 72 hours.  BNP: BNP (last 3 results) Recent Labs    11/30/20 0144 01/08/21 0731 01/18/21 1629  BNP 2,653.1* 734.2* 607.9*    ProBNP (last 3 results) Recent Labs    03/23/20 0922  PROBNP 520*     D-Dimer No results for input(s): DDIMER in the last 72 hours. Hemoglobin A1C No results for input(s): HGBA1C in the last 72 hours. Fasting Lipid Panel No results for input(s): CHOL, HDL, LDLCALC, TRIG, CHOLHDL, LDLDIRECT in the last 72 hours. Thyroid Function Tests No results for input(s): TSH, T4TOTAL, T3FREE, THYROIDAB in the last 72 hours.  Invalid input(s): FREET3  Other results:   Imaging    [place] EKG SITE RITE  Result Date: 03/09/2021 If Site Rite image not attached, placement could not be confirmed due to current cardiac rhythm.    Medications:     Scheduled Medications:  apixaban  5 mg Oral BID   atorvastatin  80 mg Oral QPM   digoxin  0.125 mg Oral Daily   diphenhydrAMINE  50 mg Oral QHS   ezetimibe  10 mg Oral Daily   furosemide  80 mg Intravenous BID   gabapentin  200 mg Oral TID   ivabradine  7.5 mg Oral BID WC   melatonin  5 mg Oral QHS   midodrine  5 mg Oral TID WC   pantoprazole  40 mg Oral Daily   potassium chloride  40 mEq Oral Once   sertraline  50 mg Oral Daily   tamsulosin  0.4 mg Oral Daily    Infusions:  ceFEPime (MAXIPIME) IV 2 g (03/09/21 2336)    PRN Medications: acetaminophen **OR** acetaminophen, guaiFENesin, ondansetron **OR** ondansetron (ZOFRAN) IV    Patient Profile    Mr Burroughs is a 65 year old with history of smoker, CAD s/p previous MI, HTN, HL, chronic HFeEF, sepsis 11/2020 bacteremia.  Admitted with sepsis. Bld Cx pending.     Assessment/Plan   Sepsis - H/o strep bacteremia 11/2020.  ICD extracted 12/04/20. Barostim was not removed as it is extravascular.  - Had been on IV antibiotics until 01/15/21. Blood cultures negative 01/30/21. - WBCs 16 yesterday, pending today.  - Procalcitonin 0.22  - 10/26 -Blood CX x2   NGTD - ?PNA - ID following, now on cefepime and off vancomycin.   2. Acute Hypoxic Respiratory Failure  - Prior to admit using oxygen as needed.  - Oxygen requirements increased to 8 liters [place]. Sats dropping < 85% with ambulation.   - CXR with pulmonary edema.  - Good diuresis with IV Lasix yesterday, feels better.    3. Chronic Systolic HFr EF - Echo 06/21/22 EF 20-25% RV mildly HK. moderate AS  Mean gradient 13 AVA 1.2 cm2 DI 0.30 - RHC (7/22): EF 20%, low filling pressures. CI  2.3.  - No bb with hypotension.  - Continue ivabradine 7.5 mg twice a day.  - Continue digoxin.  - On midodrine 5 mg tid, BP stable today.  - Still looks volume overloaded on exam.  Unfortunately, no labs yet.  Lasix 80 mg IV bid again today, replace K.  - PICC placed, follow CVP and co-ox.    4. CAD  s/p anterior STEMI (12/20). LHC showed chronically occluded RCA (with L>>R collaterals) and thrombotic occlusion of proximal LAD. Underwent PCI of LAD. - LHC (7/22) with stable CAD.  - No chest pain.    5. H./O RUE DVT On eliquis.   Length of Stay: 2  Loralie Champagne, MD  03/10/2021, 9:24 AM  Advanced Heart Failure Team Pager [phone] (M-F; 7a - 5p)  Please contact [facility] for night-coverage after hours (5p -7a ) and weekends on [email or web address]

## 2021-03-10 NOTE — Progress Notes (Signed)
Pt had 7 beat run of vtach  Pt asymptomatic  Md notified

## 2021-03-11 ENCOUNTER — Inpatient Hospital Stay (HOSPITAL_COMMUNITY): Payer: Medicare Other

## 2021-03-11 DIAGNOSIS — J9622 Acute and chronic respiratory failure with hypercapnia: Secondary | ICD-10-CM

## 2021-03-11 DIAGNOSIS — J432 Centrilobular emphysema: Secondary | ICD-10-CM

## 2021-03-11 DIAGNOSIS — J9621 Acute and chronic respiratory failure with hypoxia: Secondary | ICD-10-CM | POA: Diagnosis not present

## 2021-03-11 DIAGNOSIS — R7881 Bacteremia: Secondary | ICD-10-CM | POA: Diagnosis not present

## 2021-03-11 DIAGNOSIS — M456 Ankylosing spondylitis lumbar region: Secondary | ICD-10-CM | POA: Diagnosis not present

## 2021-03-11 LAB — COOXEMETRY PANEL
Carboxyhemoglobin: 1.1 % (ref 0.5–1.5)
Methemoglobin: 0.8 % (ref 0.0–1.5)
O2 Saturation: 57.3 %
Total hemoglobin: 10.7 g/dL — ABNORMAL LOW (ref 12.0–16.0)

## 2021-03-11 LAB — CBC WITH DIFFERENTIAL/PLATELET
Abs Immature Granulocytes: 0.07 10*3/uL (ref 0.00–0.07)
Basophils Absolute: 0 10*3/uL (ref 0.0–0.1)
Basophils Relative: 0 %
Eosinophils Absolute: 0.4 10*3/uL (ref 0.0–0.5)
Eosinophils Relative: 3 %
HCT: 31.8 % — ABNORMAL LOW (ref 39.0–52.0)
Hemoglobin: 10.1 g/dL — ABNORMAL LOW (ref 13.0–17.0)
Immature Granulocytes: 1 %
Lymphocytes Relative: 4 %
Lymphs Abs: 0.6 10*3/uL — ABNORMAL LOW (ref 0.7–4.0)
MCH: 24.8 pg — ABNORMAL LOW (ref 26.0–34.0)
MCHC: 31.8 g/dL (ref 30.0–36.0)
MCV: 78.1 fL — ABNORMAL LOW (ref 80.0–100.0)
Monocytes Absolute: 0.8 10*3/uL (ref 0.1–1.0)
Monocytes Relative: 6 %
Neutro Abs: 10.8 10*3/uL — ABNORMAL HIGH (ref 1.7–7.7)
Neutrophils Relative %: 86 %
Platelets: 292 10*3/uL (ref 150–400)
RBC: 4.07 MIL/uL — ABNORMAL LOW (ref 4.22–5.81)
RDW: 16.9 % — ABNORMAL HIGH (ref 11.5–15.5)
WBC: 12.7 10*3/uL — ABNORMAL HIGH (ref 4.0–10.5)
nRBC: 0 % (ref 0.0–0.2)

## 2021-03-11 LAB — BASIC METABOLIC PANEL
Anion gap: 8 (ref 5–15)
BUN: 19 mg/dL (ref 8–23)
CO2: 28 mmol/L (ref 22–32)
Calcium: 8.7 mg/dL — ABNORMAL LOW (ref 8.9–10.3)
Chloride: 93 mmol/L — ABNORMAL LOW (ref 98–111)
Creatinine, Ser: 0.6 mg/dL — ABNORMAL LOW (ref 0.61–1.24)
GFR, Estimated: >60 mL/min (ref >60)
Glucose, Bld: 174 mg/dL — ABNORMAL HIGH (ref 70–99)
Potassium: 3.3 mmol/L — ABNORMAL LOW (ref 3.5–5.1)
Sodium: 129 mmol/L — ABNORMAL LOW (ref 135–145)

## 2021-03-11 LAB — URINE CULTURE: Culture: NO GROWTH

## 2021-03-11 LAB — MAGNESIUM: Magnesium: 1.9 mg/dL (ref 1.7–2.4)

## 2021-03-11 MED ORDER — FUROSEMIDE 40 MG PO TABS
40.0000 mg | ORAL_TABLET | Freq: Every day | ORAL | Status: DC
  Administered 2021-03-11 – 2021-03-18 (×8): 40 mg via ORAL
  Filled 2021-03-11 (×8): qty 1

## 2021-03-11 NOTE — Progress Notes (Signed)
Subjective:  He feels better and is happy that he is not on as much oxygen now down to 7 L via high flow nasal cannula   Antibiotics:  Anti-infectives (From admission, onward)    Start     Dose/Rate Route Frequency Ordered Stop   03/08/21 0700  vancomycin (VANCOREADY) IVPB 1250 mg/250 mL  Status:  Discontinued        1,250 mg 166.7 mL/hr over 90 Minutes Intravenous Every 12 hours 03/07/21 1801 03/09/21 1320   03/08/21 0000  ceFEPIme (MAXIPIME) 2 g in sodium chloride 0.9 % 100 mL IVPB  Status:  Discontinued        2 g 200 mL/hr over 30 Minutes Intravenous Every 8 hours 03/07/21 1800 03/10/21 1201   03/07/21 1500  vancomycin (VANCOREADY) IVPB 1500 mg/300 mL        1,500 mg 150 mL/hr over 120 Minutes Intravenous  Once 03/07/21 1455 03/08/21 0118   03/07/21 1500  ceFEPIme (MAXIPIME) 2 g in sodium chloride 0.9 % 100 mL IVPB        2 g 200 mL/hr over 30 Minutes Intravenous  Once 03/07/21 1455 03/07/21 1628       Medications: Scheduled Meds:  apixaban  5 mg Oral BID   atorvastatin  80 mg Oral QPM   Chlorhexidine Gluconate Cloth  6 each Topical Daily   digoxin  0.125 mg Oral Daily   diphenhydrAMINE  50 mg Oral QHS   ezetimibe  10 mg Oral Daily   furosemide  40 mg Oral Daily   gabapentin  200 mg Oral TID   ivabradine  7.5 mg Oral BID WC   lidocaine  1 patch Transdermal Q24H   melatonin  5 mg Oral QHS   midodrine  5 mg Oral TID WC   pantoprazole  40 mg Oral Daily   potassium chloride  40 mEq Oral BID   sertraline  50 mg Oral Daily   sodium chloride flush  10-40 mL Intracatheter Q12H   tamsulosin  0.4 mg Oral Daily   Continuous Infusions:  milrinone 0.25 mcg/kg/min (03/11/21 0612)   PRN Meds:.acetaminophen **OR** acetaminophen, guaiFENesin, ondansetron **OR** ondansetron (ZOFRAN) IV, sodium chloride flush    Objective: Weight change: -0.6 kg  Intake/Output Summary (Last 24 hours) at 03/11/2021 1538 Last data filed at 03/10/2021 2300 Gross per 24 hour   Intake 534.67 ml  Output 1600 ml  Net -1065.33 ml    Blood pressure 103/72, pulse 77, temperature (!) 97.5 F (36.4 C), temperature source Oral, resp. rate 20, height 6' (1.829 m), weight 74.1 kg, SpO2 94 %. Temp:  [97.5 F (36.4 C)-99 F (37.2 C)] 97.5 F (36.4 C) (10/30 1030) Pulse Rate:  [77-92] 77 (10/30 1030) Resp:  [18-24] 20 (10/30 1030) BP: (95-103)/(61-72) 103/72 (10/30 1030) SpO2:  [94 %-97 %] 94 % (10/30 0354) Weight:  [74.1 kg] 74.1 kg (10/30 0354)  Physical Exam: Physical Exam Constitutional:      Appearance: He is well-developed.  HENT:     Head: Normocephalic and atraumatic.  Eyes:     Conjunctiva/sclera: Conjunctivae normal.  Cardiovascular:     Rate and Rhythm: Regular rhythm. Tachycardia present.     Heart sounds: No murmur heard.   No friction rub. No gallop.  Pulmonary:     Effort: Pulmonary effort is normal. No respiratory distress.     Breath sounds: No stridor. Rales present. No wheezing.  Abdominal:     General: There is no distension.  Palpations: Abdomen is soft.  Musculoskeletal:        General: Normal range of motion.     Cervical back: Normal range of motion and neck supple.  Skin:    General: Skin is warm and dry.     Findings: No erythema or rash.  Neurological:     General: No focal deficit present.     Mental Status: He is alert and oriented to person, place, and time.  Psychiatric:        Mood and Affect: Mood normal.        Behavior: Behavior normal.        Thought Content: Thought content normal.        Judgment: Judgment normal.     CBC:    BMET Recent Labs    03/10/21 0901 03/11/21 1038  NA 129* 129*  K 3.2* 3.3*  CL 95* 93*  CO2 25 28  GLUCOSE 123* 174*  BUN 21 19  CREATININE 0.67 0.60*  CALCIUM 8.7* 8.7*      Liver Panel  No results for input(s): PROT, ALBUMIN, AST, ALT, ALKPHOS, BILITOT, BILIDIR, IBILI in the last 72 hours.      Sedimentation Rate No results for input(s): ESRSEDRATE in the  last 72 hours. C-Reactive Protein No results for input(s): CRP in the last 72 hours.  Micro Results: Recent Results (from the past 720 hour(s))  Resp Panel by RT-PCR (Flu A&B, Covid) Nasopharyngeal Swab     Status: None   Collection Time: 03/07/21 12:00 PM   Specimen: Nasopharyngeal Swab; Nasopharyngeal(NP) swabs in vial transport medium  Result Value Ref Range Status   SARS Coronavirus 2 by RT PCR NEGATIVE NEGATIVE Final    Comment: (NOTE) SARS-CoV-2 target nucleic acids are NOT DETECTED.  The SARS-CoV-2 RNA is generally detectable in upper respiratory specimens during the acute phase of infection. The lowest concentration of SARS-CoV-2 viral copies this assay can detect is 138 copies/mL. A negative result does not preclude SARS-Cov-2 infection and should not be used as the sole basis for treatment or other patient management decisions. A negative result may occur with  improper specimen collection/handling, submission of specimen other than nasopharyngeal swab, presence of viral mutation(s) within the areas targeted by this assay, and inadequate number of viral copies(<138 copies/mL). A negative result must be combined with clinical observations, patient history, and epidemiological information. The expected result is Negative.  Fact Sheet for Patients:  EntrepreneurPulse.com.au  Fact Sheet for Healthcare Providers:  IncredibleEmployment.be  This test is no t yet approved or cleared by the [place] FDA and  has been authorized for detection and/or diagnosis of SARS-CoV-2 by FDA under an Emergency Use Authorization (EUA). This EUA will remain  in effect (meaning this test can be used) for the duration of the COVID-19 declaration under Section 564(b)(1) of the Act, 21 U.S.C.section 360bbb-3(b)(1), unless the authorization is terminated  or revoked sooner.       Influenza A by PCR NEGATIVE NEGATIVE Final   Influenza B by PCR NEGATIVE  NEGATIVE Final    Comment: (NOTE) The Xpert Xpress SARS-CoV-2/FLU/RSV plus assay is intended as an aid in the diagnosis of influenza from Nasopharyngeal swab specimens and should not be used as a sole basis for treatment. Nasal washings and aspirates are unacceptable for Xpert Xpress SARS-CoV-2/FLU/RSV testing.  Fact Sheet for Patients: EntrepreneurPulse.com.au  Fact Sheet for Healthcare Providers: IncredibleEmployment.be  This test is not yet approved or cleared by the [place] and has been authorized for  detection and/or diagnosis of SARS-CoV-2 by FDA under an Emergency Use Authorization (EUA). This EUA will remain in effect (meaning this test can be used) for the duration of the COVID-19 declaration under Section 564(b)(1) of the Act, 21 U.S.C. section 360bbb-3(b)(1), unless the authorization is terminated or revoked.  Performed at [facility], [place] [address], [place], [place] [address]   Respiratory (~20 pathogens) panel by PCR     Status: None   Collection Time: 03/07/21 12:00 PM   Specimen: Nasopharyngeal Swab; Respiratory  Result Value Ref Range Status   Adenovirus NOT DETECTED NOT DETECTED Final   Coronavirus 229E NOT DETECTED NOT DETECTED Final    Comment: (NOTE) The Coronavirus on the Respiratory Panel, DOES NOT test for the novel  Coronavirus (2019 nCoV)    Coronavirus HKU1 NOT DETECTED NOT DETECTED Final   Coronavirus NL63 NOT DETECTED NOT DETECTED Final   Coronavirus OC43 NOT DETECTED NOT DETECTED Final   Metapneumovirus NOT DETECTED NOT DETECTED Final   Rhinovirus / Enterovirus NOT DETECTED NOT DETECTED Final   Influenza A NOT DETECTED NOT DETECTED Final   Influenza B NOT DETECTED NOT DETECTED Final   Parainfluenza Virus 1 NOT DETECTED NOT DETECTED Final   Parainfluenza Virus 2 NOT DETECTED NOT DETECTED Final   Parainfluenza Virus 3 NOT DETECTED NOT DETECTED Final   Parainfluenza Virus 4 NOT DETECTED NOT  DETECTED Final   Respiratory Syncytial Virus NOT DETECTED NOT DETECTED Final   Bordetella pertussis NOT DETECTED NOT DETECTED Final   Bordetella Parapertussis NOT DETECTED NOT DETECTED Final   Chlamydophila pneumoniae NOT DETECTED NOT DETECTED Final   Mycoplasma pneumoniae NOT DETECTED NOT DETECTED Final    Comment: Performed at [facility], [place]. [address] [place], [place] [address]  Blood culture (routine x 2)     Status: None (Preliminary result)   Collection Time: 03/07/21 12:54 PM   Specimen: BLOOD LEFT WRIST  Result Value Ref Range Status   Specimen Description BLOOD LEFT WRIST  Final   Special Requests   Final    BOTTLES DRAWN AEROBIC AND ANAEROBIC Blood Culture adequate volume   Culture   Final    NO GROWTH 4 DAYS Performed at [facility], [place] [address] [place], [place] [address]    Report Status PENDING  Incomplete  Blood culture (routine x 2)     Status: None (Preliminary result)   Collection Time: 03/07/21  4:41 PM   Specimen: Site Not Specified; Blood  Result Value Ref Range Status   Specimen Description SITE NOT SPECIFIED  Final   Special Requests   Final    BOTTLES DRAWN AEROBIC AND ANAEROBIC Blood Culture results may not be optimal due to an inadequate volume of blood received in culture bottles   Culture   Final    NO GROWTH 4 DAYS Performed at [facility], [place] [address] [place],  [address]    Report Status PENDING  Incomplete  [name] Next Gen by PCR, Nasal     Status: None   Collection Time: 03/07/21 10:15 PM   Specimen: Nasal Mucosa; Nasal Swab  Result Value Ref Range Status   [name] by PCR Next Gen NOT DETECTED NOT DETECTED Final    Comment: (NOTE) The GeneXpert [name] Assay (FDA approved for NASAL specimens only), is one component of a comprehensive [name] colonization surveillance program. It is not intended to diagnose [name] infection nor to guide or monitor treatment for [name] infections. Test performance is not FDA  approved in  patients less than 59 years old. Performed at [facility], [place] [address] [place], [place] [address]     Studies/Results: [place] CHEST PORT 1 VIEW  Result Date: 03/11/2021 CLINICAL DATA:  PICC EXAM: PORTABLE CHEST 1 VIEW COMPARISON:  October 28 FINDINGS: Interval placement of a left upper extremity peripherally inserted central venous catheter tip terminating at the superior cavoatrial junction. There is a right chest implantable cardiac device with unchanged positioning of the leads, with notably 1 of the leads coursing into the right neck. The cardiomediastinal silhouette is within normal limits. Calcifications of the thoracic aorta. No pleural effusion. No pneumothorax. Although there has been interval improvement in the bilateral upper lung interstitial and hazy opacities, there are now new more pronounced bilateral patchy opacities in the lung bases. IMPRESSION: 1. Interval placement of a left upper extremity PICC with tip terminating near the superior cavoatrial junction. 2. Although there has been some interval improvement in the previously seen bilateral upper lung predominant hazy and interstitial opacities, there are now new bilateral patchy opacities now also involving the lower lungs, which raises question of infection versus redistribution of pulmonary edema. Electronically Signed   By: Albin Felling M.D.   On: 03/11/2021 09:48   [place] EKG SITE RITE  Result Date: 03/09/2021 If Site Rite image not attached, placement could not be confirmed due to current cardiac rhythm.     Assessment/Plan:  INTERVAL HISTORY:   He is continue to be afebrile off antibiotics chest x-ray suggested some infiltrates at the bases and high resolution CT has been ordered but not yet formally read by radiology.   Principal Problem:   Acute on chronic respiratory failure with hypoxia (HCC) Active Problems:   Hyperlipidemia with target LDL less than 70   Essential hypertension   CAD (coronary artery  disease)   Ischemic cardiomyopathy   Centrilobular emphysema (HCC)   Normocytic anemia   Respiratory failure (HCC)   Bacteremia   Systemic inflammatory response syndrome (SIRS) (HCC)   Chronic systolic CHF (congestive heart failure) (HCC)   Ankylosing spondylitis (HCC)   Leukocytosis   CAP (community acquired pneumonia)   ILD (interstitial lung disease) ([place])    SYLAR VOONG is a 65 y.o. male whom I am quite familiar with from his prior admission.  He has a history of ankylosing spondylitis having been previously on Biologics also advanced heart failure  Aortic stenosis, with history of barostim and  ICD with the latter having been removed during a recent admission in July with to coccus gordonae  bacteremia.    His hospital stayi n July  that time was complicated by septic and cardiogenic shock.  His transesophageal echocardiogram that time it shown what could be a fibroblastoma versus a vegetation.  CT chest abdomen and pelvis at that time failed to show evidence of a source for his bacteremia Panorex was not possible to do at the time.  CT maxillofacial though failed to show any evidence of significant pathology in his oropharynx.  I saw him in clinic for follow-up and took surveillance blood cultures after he had been off antibiotics for greater than 2 weeks and these were reassuringly negative.  He is remained off immunosuppressive therapy and is in the process of being evaluated by [facility] for possible heart transplant, versus LVAD.  He has now been admitted with acute on chronic respiratory failure and apparent cardiogenic shock.   He had blood cultures taken and was placed on vancomycin and cefepime.  Cultures have been negative so far and vancomycin was stopped yesterday with cefepime having can stop today.  Chest x-ray does not show lobar pneumonia but does show interstitial infiltrates which could be pulmonary edema.  My partner Dr. Gale Journey was also concerned about the risk of  interstitial lung disease in the context of untreated ankylosing spondylitis.   #1 acute on chronic respiratory failure:  Largely cardiogenic in nature though I think that it is reasonable to look for potential interstitial lung disease with a high resolution CT scan.  If there is no clear evidence for bacterial pneumonia and need for antibiotics I will sign off.    #2 questionable pneumonia: He is done well off antibiotics.  Again I think this is largely cardiogenic plus or minus the possibility of interstitial lung disease in the context of ankylosing spondylitis   #3  History of bacteremia with Streptococcus gordaae and  possible endocarditis sp ICD removal and 6 weeks of antibiotics followed by surveillance blood cultures greater than 2 weeks after stopping antibiotics which were reassuringly negative.  #4 advanced heart failure: My understanding is there is been consideration for LVAD versus medical management  I spent  36 minutes with the patient including face to face counseling of the patient regarding his heart failure the question of interstitial lung diseases prior bacteremia and possible ICD infection possible endocarditis personally reviewing his chest x-ray from today as well as his updated blood cultures CBC BMP along with along with review of medical records before and during the visit and in coordination of his care.      LOS: 3 days   Alcide Evener 03/11/2021, 3:38 PM

## 2021-03-11 NOTE — Progress Notes (Addendum)
PROGRESS NOTE    Brandon [name]  JXB:[id number] DOB: 1955-10-04 DOA: 03/07/2021 PCP: [name], MD    Brief Narrative:  Brandon [name] was admitted to the hospital with the working diagnosis of acute on chronic hypoxemic respiratory failure.   65 year old male past medical history for coronary artery disease, systolic heart failure status post AICD, (extracted 7/22 due to infection, completed antibiotic therapy 01/15/2021 with ceftriaxone.), he also has moderate aortic stenosis, hypertension, dyslipidemia, and right upper extremity deep vein thrombosis who presented with acute onset of dyspnea.  Reported not feeling well for about 7 days, intermittent nausea, fatigue and chills.  On the day of hospitalization he had acute onset of severe dyspnea that prompted him to come to the hospital.  His oxygenation at home was 80% despite supplemental oxygen 2 L per nasal cannula.  On his initial physical examination he was afebrile, blood pressure 105/73, heart rate 98, respiratory rate 20, oxygen saturation 98% on 4 L of supplemental oxygen per nasal cannula. He was awake and alert, his lungs had bilateral rales, no wheezing, heart S1-S2, present, rhythmic, positive systolic murmur, abdomen soft nontender, no lower extremity edema.  Sodium 133, potassium 3.6, chloride 100, bicarb 25, glucose 113, BUN 15, creatinine 0.70, ABG pH 7.45, PCO2 37.3, PO2 102, bicarb 27. White count 25.3, hemoglobin 10.9, hematocrit 34.4, platelets 329. SARS COVID-19 negative.  Chest radiograph with no frank infiltrates, mild bilateral hilar vascular congestion.  EKG 98 bpm, normal axis, normal intervals, sinus rhythm with poor R progression, no significant ST segment changes, negative T waves V4-V6.  Possible artifact.  Patient was placed on broad spectrum antibiotic therapy and supplemental 02 per [place].   Assessment & Plan:   Principal Problem:   Acute on chronic respiratory failure with hypoxia (HCC) Active Problems:    Hyperlipidemia with target LDL less than 70   Essential hypertension   CAD (coronary artery disease)   Ischemic cardiomyopathy   Centrilobular emphysema (HCC)   Normocytic anemia   Respiratory failure (HCC)   Bacteremia   Systemic inflammatory response syndrome (SIRS) (HCC)   Chronic systolic CHF (congestive heart failure) (HCC)   Ankylosing spondylitis (HCC)   Leukocytosis   CAP (community acquired pneumonia)   ILD (interstitial lung disease) ([facility])   Acute on chronic hypoxemic respiratory failure, possible pneumonia. Sepsis Patient typically uses 2 to 3 L of oxygen at home.  Currently on 8 L and saturating 93%. Procalcitonin unremarkable, chest x-ray also not impressive for pneumonia.  I agree with ID, I am personally not convinced that this is pneumonia.  All antibiotics discontinued on 03/10/2021.  Seems to be combination of congestive heart failure and I also agree that he may have some interstitial lung disease since he has rales on the examination.  My plan was to order CT chest, I see that heart failure team has already ordered high-resolution CT scan, I agree with that.  Continue Lasix per heart failure team.  Continue to wean oxygen, encouraged incentive spirometry.  2.  Chronic systolic heart failure  echocardiogram from 13/08 with LV systolic function of 65%, with severe dilatation of the left ventricle. Global hypokinesis.  Chest x-ray shows component of some pulmonary edema.  Heart failure team switched from IV Lasix 80 mg twice daily to oral Lasix 40 mg p.o. daily.  He is also on digoxin.  Eplerenone and Farxiga on hold.  No beta-blocker due to hypotension.  Also on ivabradine 7.5 mg twice daily.  Patient states that he feels better  since yesterday, since he has been started on Lasix.  Plan was for LVAD but this was on hold due to bacteremia.  3. Dyslipidemia. Continue atorvastatin and ezetimibe.   4. Right upper extremity DVT, with neuropathy. Continue anticoagulation with  apixaban.  Gabapentin increased to 200 mg 3 times daily. Increase gabapentin to 200 mg tid.   5. Depression. Continue with sertraline.    6.  BPH: Continue Flomax.  7.  Hypokalemia: Already on a scheduled replacement.  DVT prophylaxis: Apixaban   Code Status:    full  Family Communication:   No family at the bedside     Consultants:  Cardiology     Subjective:  Patient seen and examined.  He states that his breathing is improving slowly.  No new complaint.  Objective: Vitals:   03/10/21 1929 03/10/21 2331 03/11/21 0354 03/11/21 1030  BP: 95/62 97/61 95/67  103/72  Pulse: 88 80 83 77  Resp: 20 (!) 24 18 20   Temp: 99 F (37.2 C) 98.2 F (36.8 C) 97.9 F (36.6 C) (!) 97.5 F (36.4 C)  TempSrc: Oral Oral Oral Oral  SpO2: 95% 97% 94%   Weight:   74.1 kg   Height:        Intake/Output Summary (Last 24 hours) at 03/11/2021 1228 Last data filed at 03/10/2021 2300 Gross per 24 hour  Intake 534.67 ml  Output 1600 ml  Net -1065.33 ml    Filed Weights   03/09/21 0644 03/10/21 0400 03/11/21 0354  Weight: 80.5 kg 74.7 kg 74.1 kg    Examination:   General exam: Appears calm and comfortable  Respiratory system: Rales at middle and lower lobes bilaterally. Respiratory effort normal. Cardiovascular system: S1 & S2 heard, RRR. No JVD, murmurs, rubs, gallops or clicks. No pedal edema. Gastrointestinal system: Abdomen is nondistended, soft and nontender. No organomegaly or masses felt. Normal bowel sounds heard. Central nervous system: Alert and oriented. No focal neurological deficits. Extremities: Symmetric 5 x 5 power. Skin: No rashes, lesions or ulcers.  Psychiatry: Judgement and insight appear normal. Mood & affect appropriate.   Data Reviewed: I have personally reviewed following labs and imaging studies  CBC: Recent Labs  Lab 03/07/21 1201 03/07/21 1218 03/08/21 0925 03/09/21 0904 03/10/21 0901 03/11/21 1038  WBC 25.3*  --  16.0* 15.9* 14.2* 12.7*   NEUTROABS  --   --  14.9*  --  12.7* 10.8*  HGB 10.9* 11.6* 10.1* 10.4* 10.6* 10.1*  HCT 34.4* 34.0* 32.2* 33.4* 33.7* 31.8*  MCV 80.0  --  79.7* 80.3 78.9* 78.1*  PLT 329  --  297 287 333 973    Basic Metabolic Panel: Recent Labs  Lab 03/07/21 1201 03/07/21 1218 03/08/21 0925 03/09/21 0414 03/10/21 0901 03/11/21 1038  NA 133* 134* 128* 131* 129* 129*  K 3.6 3.5 3.2* 3.6 3.2* 3.3*  CL 100  --  97* 101 95* 93*  CO2 25  --  20* 24 25 28   GLUCOSE 113*  --  142* 103* 123* 174*  BUN 15  --  19 21 21 19   CREATININE 0.70  --  0.62 0.60* 0.67 0.60*  CALCIUM 8.7*  --  8.3* 8.7* 8.7* 8.7*  MG  --   --   --   --   --  1.9    GFR: Estimated Creatinine Clearance: 93.9 mL/min (A) (by C-G formula based on SCr of 0.6 mg/dL (L)). Liver Function Tests: Recent Labs  Lab 03/07/21 2058  AST 20  ALT 19  ALKPHOS 92  BILITOT 1.5*  PROT 6.5  ALBUMIN 2.8*    No results for input(s): LIPASE, AMYLASE in the last 168 hours. No results for input(s): AMMONIA in the last 168 hours. Coagulation Profile: No results for input(s): INR, PROTIME in the last 168 hours. Cardiac Enzymes: No results for input(s): CKTOTAL, CKMB, CKMBINDEX, TROPONINI in the last 168 hours. BNP (last 3 results) Recent Labs    03/23/20 0922  PROBNP 520*    HbA1C: No results for input(s): HGBA1C in the last 72 hours. CBG: No results for input(s): GLUCAP in the last 168 hours. Lipid Profile: No results for input(s): CHOL, HDL, LDLCALC, TRIG, CHOLHDL, LDLDIRECT in the last 72 hours. Thyroid Function Tests: No results for input(s): TSH, T4TOTAL, FREET4, T3FREE, THYROIDAB in the last 72 hours. Anemia Panel: No results for input(s): VITAMINB12, FOLATE, FERRITIN, TIBC, IRON, RETICCTPCT in the last 72 hours.    Radiology Studies: I have reviewed all of the imaging during this hospital visit personally  Scheduled Meds:  apixaban  5 mg Oral BID   atorvastatin  80 mg Oral QPM   Chlorhexidine Gluconate Cloth  6 each  Topical Daily   digoxin  0.125 mg Oral Daily   diphenhydrAMINE  50 mg Oral QHS   ezetimibe  10 mg Oral Daily   furosemide  40 mg Oral Daily   gabapentin  200 mg Oral TID   ivabradine  7.5 mg Oral BID WC   lidocaine  1 patch Transdermal Q24H   melatonin  5 mg Oral QHS   midodrine  5 mg Oral TID WC   pantoprazole  40 mg Oral Daily   potassium chloride  40 mEq Oral BID   sertraline  50 mg Oral Daily   sodium chloride flush  10-40 mL Intracatheter Q12H   tamsulosin  0.4 mg Oral Daily   Continuous Infusions:  milrinone 0.25 mcg/kg/min (03/11/21 0612)      LOS: 3 days    Total time spent in minutes: [place], MD [place]

## 2021-03-11 NOTE — Progress Notes (Signed)
Patient ID: Brandon [name], male   DOB: 11-22-1955, 65 y.o.   MRN: [id number]     Advanced Heart Failure Rounding Note  PCP-Cardiologist: [name], MD   Subjective:    Admitted with sepsis. Started on cefepime + vanc, now off abx (ID following). Afebrile.  10/27 started on ivabradine 7.5 mg twice a day and midodrine 5 mg tid.  10/29 Co-ox 48%, started on milrinone 0.25.   Remains on 8 liters HFNC but oxygen saturation 98%.  Good diuresis again yesterday with IV Lasix, says his breathing is better.  Co-ox up to 57% on milrinone 0.25.  CVP 5 this morning (down).     Objective:   Weight Range: 74.1 kg Body mass index is 22.16 kg/m.   Vital Signs:   Temp:  [97.5 F (36.4 C)-99 F (37.2 C)] 97.9 F (36.6 C) (10/30 0354) Pulse Rate:  [80-96] 83 (10/30 0354) Resp:  [18-24] 18 (10/30 0354) BP: (95-111)/(61-87) 95/67 (10/30 0354) SpO2:  [94 %-99 %] 94 % (10/30 0354) Weight:  [74.1 kg] 74.1 kg (10/30 0354) Last BM Date: 03/09/21  Weight change: Filed Weights   03/09/21 0644 03/10/21 0400 03/11/21 0354  Weight: 80.5 kg 74.7 kg 74.1 kg    Intake/Output:   Intake/Output Summary (Last 24 hours) at 03/11/2021 0907 Last data filed at 03/10/2021 2300 Gross per 24 hour  Intake 1014.67 ml  Output 1800 ml  Net -785.33 ml      Physical Exam    General: NAD Neck: No JVD, no thyromegaly or thyroid nodule.  Lungs: Crackles at bases.  CV: Nondisplaced PMI.  Heart regular S1/S2, no S3/S4, no murmur.  No peripheral edema.   Abdomen: Soft, nontender, no hepatosplenomegaly, no distention.  Skin: Intact without lesions or rashes.  Neurologic: Alert and oriented x 3.  Psych: Normal affect. Extremities: No clubbing or cyanosis.  HEENT: Normal.    Telemetry   ST 90s personally checked.   EKG    N/A   Labs    CBC Recent Labs    03/08/21 0925 03/09/21 0904 03/10/21 0901  WBC 16.0* 15.9* 14.2*  NEUTROABS 14.9*  --  12.7*  HGB 10.1* 10.4* 10.6*  HCT 32.2* 33.4* 33.7*   MCV 79.7* 80.3 78.9*  PLT 297 287 962   Basic Metabolic Panel Recent Labs    03/09/21 0414 03/10/21 0901  NA 131* 129*  K 3.6 3.2*  CL 101 95*  CO2 24 25  GLUCOSE 103* 123*  BUN 21 21  CREATININE 0.60* 0.67  CALCIUM 8.7* 8.7*   Liver Function Tests No results for input(s): AST, ALT, ALKPHOS, BILITOT, PROT, ALBUMIN in the last 72 hours.  No results for input(s): LIPASE, AMYLASE in the last 72 hours. Cardiac Enzymes No results for input(s): CKTOTAL, CKMB, CKMBINDEX, TROPONINI in the last 72 hours.  BNP: BNP (last 3 results) Recent Labs    11/30/20 0144 01/08/21 0731 01/18/21 1629  BNP 2,653.1* 734.2* 607.9*    ProBNP (last 3 results) Recent Labs    03/23/20 0922  PROBNP 520*     D-Dimer No results for input(s): DDIMER in the last 72 hours. Hemoglobin A1C No results for input(s): HGBA1C in the last 72 hours. Fasting Lipid Panel No results for input(s): CHOL, HDL, LDLCALC, TRIG, CHOLHDL, LDLDIRECT in the last 72 hours. Thyroid Function Tests No results for input(s): TSH, T4TOTAL, T3FREE, THYROIDAB in the last 72 hours.  Invalid input(s): FREET3  Other results:   Imaging    No results found.  Medications:     Scheduled Medications:  apixaban  5 mg Oral BID   atorvastatin  80 mg Oral QPM   Chlorhexidine Gluconate Cloth  6 each Topical Daily   digoxin  0.125 mg Oral Daily   diphenhydrAMINE  50 mg Oral QHS   ezetimibe  10 mg Oral Daily   furosemide  40 mg Oral Daily   gabapentin  200 mg Oral TID   ivabradine  7.5 mg Oral BID WC   lidocaine  1 patch Transdermal Q24H   melatonin  5 mg Oral QHS   midodrine  5 mg Oral TID WC   pantoprazole  40 mg Oral Daily   potassium chloride  40 mEq Oral BID   sertraline  50 mg Oral Daily   sodium chloride flush  10-40 mL Intracatheter Q12H   tamsulosin  0.4 mg Oral Daily    Infusions:  milrinone 0.25 mcg/kg/min (03/11/21 0612)    PRN Medications: acetaminophen **OR** acetaminophen, guaiFENesin,  ondansetron **OR** ondansetron (ZOFRAN) IV, sodium chloride flush    Patient Profile   Brandon [name] is a 65 year old with history of smoker, CAD s/p previous MI, HTN, HL, chronic HFeEF, sepsis 11/2020 bacteremia.  Admitted with sepsis. Bld Cx pending.     Assessment/Plan   Sepsis - H/o strep bacteremia 11/2020.  ICD extracted 12/04/20. Barostim was not removed as it is extravascular.  - Had been on IV antibiotics until 01/15/21. Blood cultures negative 01/30/21. - WBCs 16 => 14.  Afebrile.  - Procalcitonin 0.22  - 10/26 -Blood CX x2   NGTD - ?PNA => initially on cefepime/vancomycin, now off both.  ID following.   2. Acute Hypoxic Respiratory Failure  - Prior to admit using oxygen as needed.  - Oxygen requirements increased to 8 liters HFNC. Oxygen saturation 98% today => start to wean down oxygen.   - CXR with pulmonary edema initially, now has diuresed with IV Lasix and CVP 5 this morning.  - Resume home po Lasix.  - With CVP low, I will order high resolution CT chest => ?PNA, ?interstitial lung disease (there are reports of ILD related to ankylosing spondylitis).     3. Chronic Systolic HFr EF - Echo 11/11/19 EF 20-25% RV mildly HK. moderate AS  Mean gradient 13 AVA 1.2 cm2 DI 0.30 - RHC (7/22): EF 20%, low filling pressures. CI 2.3.  - No bb with hypotension.  - Continue ivabradine 7.5 mg twice a day.  - Continue digoxin.  - On midodrine 5 mg tid, BP stable today.  - Milrinone 0.25 started yesterday with volume overload and co-ox 48%.  Co-ox 57% today, CVP now 5.  Continue today, will try to wean down again tomorrow.  - With CVP 5, start back on home Lasix 40 mg po daily.     4. CAD  s/p anterior STEMI (12/20). LHC showed chronically occluded RCA (with L>>R collaterals) and thrombotic occlusion of proximal LAD. Underwent PCI of LAD. - LHC (7/22) with stable CAD.  - No chest pain.    5. H./O RUE DVT On eliquis.   Length of Stay: 3  Loralie Champagne, MD  03/11/2021, 9:07  AM  Advanced Heart Failure Team Pager [phone] (M-F; 7a - 5p)  Please contact [facility] for night-coverage after hours (5p -7a ) and weekends on [email or web address]

## 2021-03-12 DIAGNOSIS — I2510 Atherosclerotic heart disease of native coronary artery without angina pectoris: Secondary | ICD-10-CM | POA: Diagnosis not present

## 2021-03-12 DIAGNOSIS — J690 Pneumonitis due to inhalation of food and vomit: Secondary | ICD-10-CM

## 2021-03-12 DIAGNOSIS — M456 Ankylosing spondylitis lumbar region: Secondary | ICD-10-CM | POA: Diagnosis not present

## 2021-03-12 DIAGNOSIS — E785 Hyperlipidemia, unspecified: Secondary | ICD-10-CM | POA: Diagnosis not present

## 2021-03-12 DIAGNOSIS — I5022 Chronic systolic (congestive) heart failure: Secondary | ICD-10-CM | POA: Diagnosis not present

## 2021-03-12 DIAGNOSIS — J432 Centrilobular emphysema: Secondary | ICD-10-CM | POA: Diagnosis not present

## 2021-03-12 DIAGNOSIS — I10 Essential (primary) hypertension: Secondary | ICD-10-CM | POA: Diagnosis not present

## 2021-03-12 DIAGNOSIS — J9621 Acute and chronic respiratory failure with hypoxia: Secondary | ICD-10-CM | POA: Diagnosis not present

## 2021-03-12 LAB — CULTURE, BLOOD (ROUTINE X 2)
Culture: NO GROWTH
Culture: NO GROWTH
Special Requests: ADEQUATE

## 2021-03-12 LAB — COOXEMETRY PANEL
Carboxyhemoglobin: 1.2 % (ref 0.5–1.5)
Methemoglobin: 0.8 % (ref 0.0–1.5)
O2 Saturation: 59.9 %
Total hemoglobin: 11.8 g/dL — ABNORMAL LOW (ref 12.0–16.0)

## 2021-03-12 LAB — BASIC METABOLIC PANEL
Anion gap: 4 — ABNORMAL LOW (ref 5–15)
BUN: 16 mg/dL (ref 8–23)
CO2: 27 mmol/L (ref 22–32)
Calcium: 8.8 mg/dL — ABNORMAL LOW (ref 8.9–10.3)
Chloride: 97 mmol/L — ABNORMAL LOW (ref 98–111)
Creatinine, Ser: 0.54 mg/dL — ABNORMAL LOW (ref 0.61–1.24)
GFR, Estimated: >60 mL/min (ref >60)
Glucose, Bld: 134 mg/dL — ABNORMAL HIGH (ref 70–99)
Potassium: 3.8 mmol/L (ref 3.5–5.1)
Sodium: 128 mmol/L — ABNORMAL LOW (ref 135–145)

## 2021-03-12 LAB — LEGIONELLA PNEUMOPHILA SEROGP 1 UR AG: L. pneumophila Serogp 1 Ur Ag: NEGATIVE

## 2021-03-12 LAB — MAGNESIUM: Magnesium: 1.9 mg/dL (ref 1.7–2.4)

## 2021-03-12 MED ORDER — MAGNESIUM SULFATE 2 GM/50ML IV SOLN
2.0000 g | Freq: Once | INTRAVENOUS | Status: AC
  Administered 2021-03-12: 2 g via INTRAVENOUS
  Filled 2021-03-12: qty 50

## 2021-03-12 MED ORDER — AMOXICILLIN-POT CLAVULANATE 875-125 MG PO TABS
1.0000 | ORAL_TABLET | Freq: Two times a day (BID) | ORAL | Status: AC
  Administered 2021-03-12 – 2021-03-19 (×15): 1 via ORAL
  Filled 2021-03-12 (×15): qty 1

## 2021-03-12 NOTE — Progress Notes (Signed)
Subjective:     Antibiotics:  Anti-infectives (From admission, onward)    Start     Dose/Rate Route Frequency Ordered Stop   03/12/21 1000  amoxicillin-clavulanate (AUGMENTIN) 875-125 MG per tablet 1 tablet        1 tablet Oral Every 12 hours 03/12/21 0832     03/08/21 0700  vancomycin (VANCOREADY) IVPB 1250 mg/250 mL  Status:  Discontinued        1,250 mg 166.7 mL/hr over 90 Minutes Intravenous Every 12 hours 03/07/21 1801 03/09/21 1320   03/08/21 0000  ceFEPIme (MAXIPIME) 2 g in sodium chloride 0.9 % 100 mL IVPB  Status:  Discontinued        2 g 200 mL/hr over 30 Minutes Intravenous Every 8 hours 03/07/21 1800 03/10/21 1201   03/07/21 1500  vancomycin (VANCOREADY) IVPB 1500 mg/300 mL        1,500 mg 150 mL/hr over 120 Minutes Intravenous  Once 03/07/21 1455 03/08/21 0118   03/07/21 1500  ceFEPIme (MAXIPIME) 2 g in sodium chloride 0.9 % 100 mL IVPB        2 g 200 mL/hr over 30 Minutes Intravenous  Once 03/07/21 1455 03/07/21 1628       Medications: Scheduled Meds:  amoxicillin-clavulanate  1 tablet Oral Q12H   apixaban  5 mg Oral BID   atorvastatin  80 mg Oral QPM   Chlorhexidine Gluconate Cloth  6 each Topical Daily   digoxin  0.125 mg Oral Daily   diphenhydrAMINE  50 mg Oral QHS   ezetimibe  10 mg Oral Daily   furosemide  40 mg Oral Daily   gabapentin  200 mg Oral TID   ivabradine  7.5 mg Oral BID WC   lidocaine  1 patch Transdermal Q24H   melatonin  5 mg Oral QHS   midodrine  5 mg Oral TID WC   pantoprazole  40 mg Oral Daily   potassium chloride  40 mEq Oral BID   sertraline  50 mg Oral Daily   sodium chloride flush  10-40 mL Intracatheter Q12H   tamsulosin  0.4 mg Oral Daily   Continuous Infusions:  milrinone 0.125 mcg/kg/min (03/12/21 0743)   PRN Meds:.acetaminophen **OR** acetaminophen, guaiFENesin, ondansetron **OR** ondansetron (ZOFRAN) IV, sodium chloride flush    Objective: Weight change: 0.9 kg  Intake/Output Summary (Last 24 hours)  at 03/12/2021 1208 Last data filed at 03/12/2021 0800 Gross per 24 hour  Intake 630.9 ml  Output 700 ml  Net -69.1 ml    Blood pressure 125/63, pulse 83, temperature 98.4 F (36.9 C), temperature source Oral, resp. rate 20, height 6' (1.829 m), weight 75 kg, SpO2 96 %. Temp:  [97.5 F (36.4 C)-98.4 F (36.9 C)] 98.4 F (36.9 C) (10/31 1058) Pulse Rate:  [82-88] 83 (10/31 1058) Resp:  [17-24] 20 (10/31 1058) BP: (103-125)/(60-68) 125/63 (10/31 1058) SpO2:  [90 %-99 %] 96 % (10/31 1058) Weight:  [75 kg] 75 kg (10/31 0448)  Physical Exam: Physical Exam Constitutional:      Appearance: He is well-developed.  HENT:     Head: Normocephalic and atraumatic.  Eyes:     Extraocular Movements: Extraocular movements intact.     Conjunctiva/sclera: Conjunctivae normal.  Cardiovascular:     Rate and Rhythm: Normal rate and regular rhythm.     Heart sounds: No murmur heard.   No friction rub. No gallop.  Pulmonary:     Effort: Pulmonary effort is normal. No respiratory distress.  Breath sounds: No stridor. Rales present. No wheezing.  Abdominal:     General: There is no distension.     Palpations: Abdomen is soft.  Musculoskeletal:        General: Normal range of motion.     Cervical back: Normal range of motion and neck supple.  Skin:    General: Skin is warm and dry.     Findings: No erythema or rash.  Neurological:     General: No focal deficit present.     Mental Status: He is alert and oriented to person, place, and time.  Psychiatric:        Mood and Affect: Mood normal.        Behavior: Behavior normal.        Thought Content: Thought content normal.        Judgment: Judgment normal.     CBC:    BMET Recent Labs    03/11/21 1038 03/12/21 0446  NA 129* 128*  K 3.3* 3.8  CL 93* 97*  CO2 28 27  GLUCOSE 174* 134*  BUN 19 16  CREATININE 0.60* 0.54*  CALCIUM 8.7* 8.8*      Liver Panel  No results for input(s): PROT, ALBUMIN, AST, ALT, ALKPHOS,  BILITOT, BILIDIR, IBILI in the last 72 hours.      Sedimentation Rate No results for input(s): ESRSEDRATE in the last 72 hours. C-Reactive Protein No results for input(s): CRP in the last 72 hours.  Micro Results: Recent Results (from the past 720 hour(s))  Resp Panel by RT-PCR (Flu A&B, Covid) Nasopharyngeal Swab     Status: None   Collection Time: 03/07/21 12:00 PM   Specimen: Nasopharyngeal Swab; Nasopharyngeal(NP) swabs in vial transport medium  Result Value Ref Range Status   SARS Coronavirus 2 by RT PCR NEGATIVE NEGATIVE Final    Comment: (NOTE) SARS-CoV-2 target nucleic acids are NOT DETECTED.  The SARS-CoV-2 RNA is generally detectable in upper respiratory specimens during the acute phase of infection. The lowest concentration of SARS-CoV-2 viral copies this assay can detect is 138 copies/mL. A negative result does not preclude SARS-Cov-2 infection and should not be used as the sole basis for treatment or other patient management decisions. A negative result may occur with  improper specimen collection/handling, submission of specimen other than nasopharyngeal swab, presence of viral mutation(s) within the areas targeted by this assay, and inadequate number of viral copies(<138 copies/mL). A negative result must be combined with clinical observations, patient history, and epidemiological information. The expected result is Negative.  Fact Sheet for Patients:  EntrepreneurPulse.com.au  Fact Sheet for Healthcare Providers:  IncredibleEmployment.be  This test is no t yet approved or cleared by the [place] FDA and  has been authorized for detection and/or diagnosis of SARS-CoV-2 by FDA under an Emergency Use Authorization (EUA). This EUA will remain  in effect (meaning this test can be used) for the duration of the COVID-19 declaration under Section 564(b)(1) of the Act, 21 U.S.C.section 360bbb-3(b)(1), unless the authorization  is terminated  or revoked sooner.       Influenza A by PCR NEGATIVE NEGATIVE Final   Influenza B by PCR NEGATIVE NEGATIVE Final    Comment: (NOTE) The Xpert Xpress SARS-CoV-2/FLU/RSV plus assay is intended as an aid in the diagnosis of influenza from Nasopharyngeal swab specimens and should not be used as a sole basis for treatment. Nasal washings and aspirates are unacceptable for Xpert Xpress SARS-CoV-2/FLU/RSV testing.  Fact Sheet for Patients: EntrepreneurPulse.com.au  Fact Sheet  for Healthcare Providers: IncredibleEmployment.be  This test is not yet approved or cleared by the [place] and has been authorized for detection and/or diagnosis of SARS-CoV-2 by FDA under an Emergency Use Authorization (EUA). This EUA will remain in effect (meaning this test can be used) for the duration of the COVID-19 declaration under Section 564(b)(1) of the Act, 21 U.S.C. section 360bbb-3(b)(1), unless the authorization is terminated or revoked.  Performed at [facility], [place] [address], [place], [place] [address]   Respiratory (~20 pathogens) panel by PCR     Status: None   Collection Time: 03/07/21 12:00 PM   Specimen: Nasopharyngeal Swab; Respiratory  Result Value Ref Range Status   Adenovirus NOT DETECTED NOT DETECTED Final   Coronavirus 229E NOT DETECTED NOT DETECTED Final    Comment: (NOTE) The Coronavirus on the Respiratory Panel, DOES NOT test for the novel  Coronavirus (2019 nCoV)    Coronavirus HKU1 NOT DETECTED NOT DETECTED Final   Coronavirus NL63 NOT DETECTED NOT DETECTED Final   Coronavirus OC43 NOT DETECTED NOT DETECTED Final   Metapneumovirus NOT DETECTED NOT DETECTED Final   Rhinovirus / Enterovirus NOT DETECTED NOT DETECTED Final   Influenza A NOT DETECTED NOT DETECTED Final   Influenza B NOT DETECTED NOT DETECTED Final   Parainfluenza Virus 1 NOT DETECTED NOT DETECTED Final   Parainfluenza Virus 2 NOT DETECTED  NOT DETECTED Final   Parainfluenza Virus 3 NOT DETECTED NOT DETECTED Final   Parainfluenza Virus 4 NOT DETECTED NOT DETECTED Final   Respiratory Syncytial Virus NOT DETECTED NOT DETECTED Final   Bordetella pertussis NOT DETECTED NOT DETECTED Final   Bordetella Parapertussis NOT DETECTED NOT DETECTED Final   Chlamydophila pneumoniae NOT DETECTED NOT DETECTED Final   Mycoplasma pneumoniae NOT DETECTED NOT DETECTED Final    Comment: Performed at [facility], [place], [place], [place] [address]  Blood culture (routine x 2)     Status: None   Collection Time: 03/07/21 12:54 PM   Specimen: BLOOD LEFT WRIST  Result Value Ref Range Status   Specimen Description BLOOD LEFT WRIST  Final   Special Requests   Final    BOTTLES DRAWN AEROBIC AND ANAEROBIC Blood Culture adequate volume   Culture   Final    NO GROWTH 5 DAYS Performed at [facility], [place] [address] [place], [place] [address]    Report Status 03/12/2021 FINAL  Final  Blood culture (routine x 2)     Status: None   Collection Time: 03/07/21  4:41 PM   Specimen: Site Not Specified; Blood  Result Value Ref Range Status   Specimen Description SITE NOT SPECIFIED  Final   Special Requests   Final    BOTTLES DRAWN AEROBIC AND ANAEROBIC Blood Culture results may not be optimal due to an inadequate volume of blood received in culture bottles   Culture   Final    NO GROWTH 5 DAYS Performed at [facility], [place] [address], [place], [place] [address]    Report Status 03/12/2021 FINAL  Final  [name] Next Gen by PCR, Nasal     Status: None   Collection Time: 03/07/21 10:15 PM   Specimen: Nasal Mucosa; Nasal Swab  Result Value Ref Range Status   [name] by PCR Next Gen NOT DETECTED NOT DETECTED Final    Comment: (NOTE) The GeneXpert [name] Assay (FDA approved for NASAL specimens only), is one component of a comprehensive [name] colonization surveillance program. It  is not intended to diagnose [name] infection nor to guide or  monitor treatment for [name] infections. Test performance is not FDA approved in patients less than 80 years old. Performed at [facility], [place] [address] [place], [place] [address]   Urine Culture     Status: None   Collection Time: 03/09/21  5:25 PM   Specimen: Urine, Clean Catch  Result Value Ref Range Status   Specimen Description URINE, CLEAN CATCH  Final   Special Requests NONE  Final   Culture   Final    NO GROWTH Performed at [facility], [place] [address] [place], [place] [address]    Report Status 03/11/2021 FINAL  Final    Studies/Results: CT Chest High Resolution  Result Date: 03/12/2021 CLINICAL DATA:  65 year old male with history of ankylosing spondylitis. Evaluate for interstitial lung disease. EXAM: CT CHEST WITHOUT CONTRAST TECHNIQUE: Multidetector CT imaging of the chest was performed following the standard protocol without intravenous contrast. High resolution imaging of the lungs, as well as inspiratory and expiratory imaging, was performed. COMPARISON:  Chest CT 11/21/2020. FINDINGS: Cardiovascular: Heart size is mildly enlarged with left ventricular dilatation. There is no significant pericardial fluid, thickening or pericardial calcification. There is aortic atherosclerosis, as well as atherosclerosis of the great vessels of the mediastinum and the coronary arteries, including calcified atherosclerotic plaque in the left main, left anterior descending, left circumflex and right coronary arteries. Curvilinear hypoattenuation in the left ventricular myocardium throughout the mid to distal LAD territory where there is aneurysmal dilatation of the left ventricle. Severe calcifications of the aortic valve. Moderate calcifications of the mitral annulus. Mediastinum/Nodes: No pathologically enlarged mediastinal or hilar lymph nodes. Please note that accurate exclusion of hilar adenopathy is limited on noncontrast CT scans. Esophagus is unremarkable in appearance. No  axillary lymphadenopathy. Lungs/Pleura: Study is limited by considerable patient respiratory motion. Moderate bilateral pleural effusions with passive areas of subsegmental atelectasis in the dependent portions of the lower lobes of the lungs. Widespread interstitial prominence and patchy peribronchovascular airspace consolidations scattered throughout the lungs bilaterally, most evident throughout the mid to upper lungs. Diffuse bronchial wall thickening with mild to moderate centrilobular and paraseptal emphysema. Inspiratory and expiratory imaging is unremarkable. Upper Abdomen: Aortic atherosclerosis. Innumerable tiny calcified gallstones lying dependently in the gallbladder. Musculoskeletal: There are no aggressive appearing lytic or blastic lesions noted in the visualized portions of the skeleton. IMPRESSION: 1. The appearance of the lungs is most compatible with multilobar bronchopneumonia. Today's study is limited by presence of what appears to be an acute infection. Underlying interstitial lung disease is not suspected, particularly in light of lack of evidence of interstitial lung disease on prior chest CT 11/21/2020. 2. Moderate bilateral pleural effusions lying dependently. 3. Aortic atherosclerosis, in addition to left main and 3 vessel coronary artery disease. Please note that although the presence of coronary artery calcium documents the presence of coronary artery disease, the severity of this disease and any potential stenosis cannot be assessed on this non-gated CT examination. Assessment for potential risk factor modification, dietary therapy or pharmacologic therapy may be warranted, if clinically indicated. 4. Cardiomegaly with left ventricular dilatation. Curvilinear hypoattenuation throughout the mid to distal LAD territory suggestive of fibrofatty metaplasia from prior LAD territory myocardial infarction(s). 5. There are severe calcifications of the aortic valve and moderate calcifications of  the mitral annulus. Echocardiographic correlation for evaluation of potential valvular dysfunction may be warranted if clinically indicated. 6. Diffuse bronchial wall thickening with mild to  moderate centrilobular and paraseptal emphysema; imaging findings suggestive of underlying COPD. 7. Cholelithiasis. Aortic Atherosclerosis (ICD10-I70.0) and Emphysema (ICD10-J43.9). Electronically Signed   By: Vinnie Langton M.D.   On: 03/12/2021 08:17   DG CHEST PORT 1 VIEW  Result Date: 03/11/2021 CLINICAL DATA:  PICC EXAM: PORTABLE CHEST 1 VIEW COMPARISON:  October 28 FINDINGS: Interval placement of a left upper extremity peripherally inserted central venous catheter tip terminating at the superior cavoatrial junction. There is a right chest implantable cardiac device with unchanged positioning of the leads, with notably 1 of the leads coursing into the right neck. The cardiomediastinal silhouette is within normal limits. Calcifications of the thoracic aorta. No pleural effusion. No pneumothorax. Although there has been interval improvement in the bilateral upper lung interstitial and hazy opacities, there are now new more pronounced bilateral patchy opacities in the lung bases. IMPRESSION: 1. Interval placement of a left upper extremity PICC with tip terminating near the superior cavoatrial junction. 2. Although there has been some interval improvement in the previously seen bilateral upper lung predominant hazy and interstitial opacities, there are now new bilateral patchy opacities now also involving the lower lungs, which raises question of infection versus redistribution of pulmonary edema. Electronically Signed   By: Albin Felling M.D.   On: 03/11/2021 09:48      Assessment/Plan:  INTERVAL HISTORY:   Resolution CT is back and read formally as showing emphysema pleural effusion and infiltrates thought to be multifocal pneumonia   Principal Problem:   Acute on chronic respiratory failure with hypoxia  (HCC) Active Problems:   Hyperlipidemia with target LDL less than 70   Essential hypertension   CAD (coronary artery disease)   Ischemic cardiomyopathy   Centrilobular emphysema (HCC)   Normocytic anemia   Respiratory failure (HCC)   Bacteremia   Systemic inflammatory response syndrome (SIRS) (HCC)   Chronic systolic CHF (congestive heart failure) (HCC)   Ankylosing spondylitis (HCC)   Leukocytosis   CAP (community acquired pneumonia)   ILD (interstitial lung disease) ([place])    LAURIS SERVISS is a 65 y.o. male whom I am quite familiar with from his prior admission.  He has a history of ankylosing spondylitis having been previously on Biologics also advanced heart failure  Aortic stenosis, with history of barostim and  ICD with the latter having been removed during a recent admission in July with to coccus gordonae  bacteremia.    His hospital stayi n July  that time was complicated by septic and cardiogenic shock.  His transesophageal echocardiogram that time it shown what could be a fibroblastoma versus a vegetation.  CT chest abdomen and pelvis at that time failed to show evidence of a source for his bacteremia Panorex was not possible to do at the time.  CT maxillofacial though failed to show any evidence of significant pathology in his oropharynx.  I saw him in clinic for follow-up and took surveillance blood cultures after he had been off antibiotics for greater than 2 weeks and these were reassuringly negative.  He has remained off immunosuppressive therapy and is in the process of being evaluated by [facility] for possible heart transplant, versus LVAD.  He has now been admitted with acute on chronic respiratory failure and apparent cardiogenic shock.   He had blood cultures taken and was placed on vancomycin and cefepime.  Vancomycin mycin was stopped after 3 days of therapy and cefepime was stopped on day 4.  He remained afebrile and leukocytosis had resolved.  Between yesterday  and today he has now come down in terms of his oxygen requirements from 7 L to 2 L via nasal cannula.  My partner Dr. Gale Journey was also concerned about the risk of interstitial lung disease in the context of untreated ankylosing spondylitis.  CT high resolution was performed shows emphysematous changes pleural effusion and some concern for multifocal pneumonia.    #1 Acute on chronic respiratory failure:   Certainly seems to be a mixed picture of chronic emphysematous changes heart failure exacerbation and possible pyogenic pneumonia that is been superimposed.   Clinically is responded to therapy and if I did not know the results of the CT scan would not feel that he needed any further antibiotics.  Expected CT will be abnormal for some period of time.  I do think it is reasonable however to treat him with Augmentin to cover streptococcal species and anaerobes and complete 7 days of consecutive therapy.  Note he does not have any history of [name] and his PCR was negative no history of multidrug-resistant gram-negative rods.  I think Augmentin is sufficient coverage for typical organisms that would cause multi-focal infiltrates in a patient such as him and given his presentation.     #3History of bacteremia with Streptococcus gordaae and  possible endocarditis sp ICD removal and 6 weeks of antibiotics followed by surveillance blood cultures greater than 2 weeks after stopping antibiotics which were reassuringly negative.  #4 advanced heart failure: My understanding is there is been consideration for LVAD versus medical management  I spent 37 minutes with the patient including face to face counseling of the patient personally reviewing CT of the chest it to me shows quite a bit of interstitial edema and pleural effusion along with the areas of consolidative change in different lobes with superimposed emphysematous background updated culture CBC BMP MP, along with  review of medical records before  and during the visit and in coordination of his care with Dr. [name] and Dr. Doristine Bosworth  We will sign off for now please call with further questions.        LOS: 4 days   Alcide Evener 03/12/2021, 12:08 PM

## 2021-03-12 NOTE — Progress Notes (Signed)
PROGRESS NOTE    [name]  XQJ:[id number] DOB: 03/17/1956 DOA: 03/07/2021 PCP: [name], MD    Brief Narrative:  [name] was admitted to the hospital with the working diagnosis of acute on chronic hypoxemic respiratory failure.   65 year old male past medical history for coronary artery disease, systolic heart failure status post AICD, (extracted 7/22 due to infection, completed antibiotic therapy 01/15/2021 with ceftriaxone.), he also has moderate aortic stenosis, hypertension, dyslipidemia, and right upper extremity deep vein thrombosis who presented with acute onset of dyspnea.  Reported not feeling well for about 7 days, intermittent nausea, fatigue and chills.  On the day of hospitalization he had acute onset of severe dyspnea that prompted him to come to the hospital.  His oxygenation at home was 80% despite supplemental oxygen 2 L per nasal cannula.  On his initial physical examination he was afebrile, blood pressure 105/73, heart rate 98, respiratory rate 20, oxygen saturation 98% on 4 L of supplemental oxygen per nasal cannula. He was awake and alert, his lungs had bilateral rales, no wheezing, heart S1-S2, present, rhythmic, positive systolic murmur, abdomen soft nontender, no lower extremity edema.  Sodium 133, potassium 3.6, chloride 100, bicarb 25, glucose 113, BUN 15, creatinine 0.70, ABG pH 7.45, PCO2 37.3, PO2 102, bicarb 27. White count 25.3, hemoglobin 10.9, hematocrit 34.4, platelets 329. SARS COVID-19 negative.  Chest radiograph with no frank infiltrates, mild bilateral hilar vascular congestion.  EKG 98 bpm, normal axis, normal intervals, sinus rhythm with poor R progression, no significant ST segment changes, negative T waves V4-V6.  Possible artifact.  Patient was placed on broad spectrum antibiotic therapy and supplemental 02 per [place].   Assessment & Plan:   Principal Problem:   Acute on chronic respiratory failure with hypoxia (HCC) Active Problems:    Hyperlipidemia with target LDL less than 70   Essential hypertension   CAD (coronary artery disease)   Ischemic cardiomyopathy   Centrilobular emphysema (HCC)   Normocytic anemia   Respiratory failure (HCC)   Bacteremia   Systemic inflammatory response syndrome (SIRS) (HCC)   Chronic systolic CHF (congestive heart failure) (HCC)   Ankylosing spondylitis (HCC)   Leukocytosis   CAP (community acquired pneumonia)   ILD (interstitial lung disease) ([place])   Acute on chronic hypoxemic respiratory failure, possible pneumonia. Sepsis Procalcitonin unremarkable, chest x-ray also not impressive for pneumonia.  ID was consulted, antibiotics was discontinued for the same reason.  There was some concern of interstitial lung disease due to him having history of ankylosing spondylitis.  Heart failure team ordered high-resolution CT scan which shows bronchopneumonia and no indication of ILD.  Patient uses 2 L of oxygen at home.  He was on 8 L yesterday.  He was started on milrinone and that has made significant remain inpatient and currently he is on 2 L and does not have any shortness of breath.  I have informed ID about bronchopneumonia findings on the CT scan.  They were considering of initiating him on oral Augmentin, will defer to ID for that.  I still think his respiratory failure is mostly caused by his heart failure and that is supported by the fact that he improved with initiating of milrinone.  Will defer to cardiology for that.  2.  Chronic systolic heart failure  echocardiogram from 44/81 with LV systolic function of 85%, with severe dilatation of the left ventricle. Global hypokinesis.  Chest x-ray shows component of some pulmonary edema.  Heart failure team switched from IV  Lasix 80 mg twice daily to oral Lasix 40 mg p.o. daily.  He is also on digoxin.  Eplerenone and Farxiga on hold.  No beta-blocker due to hypotension.  Also on ivabradine 7.5 mg twice daily.  He feels much better since yesterday  since he has been started on milrinone again.  Plan was for LVAD but this was on hold due to bacteremia.  3. Dyslipidemia. Continue atorvastatin and ezetimibe.   4. Right upper extremity DVT, with neuropathy. Continue anticoagulation with apixaban.  Gabapentin increased to 200 mg 3 times daily. Increase gabapentin to 200 mg tid.   5. Depression. Continue with sertraline.    6.  BPH: Continue Flomax.  7.  Hypokalemia: Already on a scheduled replacement.  8.  Hyponatremia: 128.  Likely due to heart failure.  DVT prophylaxis: Apixaban   Code Status:    full  Family Communication:   No family at the bedside     Consultants:  Cardiology     Subjective:  Patient seen and examined.  He has no new complaint.  His breathing is significantly improved and he is only on 2 L oxygen.  He is very happy about that.  I received a message from the nurse yesterday to give a call to his brother for update.  I discussed with the patient, he tells me that I do not need to call his brother.  He has been talking to his family.  Objective: Vitals:   03/12/21 0018 03/12/21 0316 03/12/21 0448 03/12/21 0748  BP:  121/67  112/68  Pulse:  82  82  Resp:  20  18  Temp:  98.1 F (36.7 C)  98.2 F (36.8 C)  TempSrc:  Oral  Oral  SpO2: 96% 90%  95%  Weight:   75 kg   Height:        Intake/Output Summary (Last 24 hours) at 03/12/2021 1026 Last data filed at 03/12/2021 0800 Gross per 24 hour  Intake 630.9 ml  Output 700 ml  Net -69.1 ml    Filed Weights   03/10/21 0400 03/11/21 0354 03/12/21 0448  Weight: 74.7 kg 74.1 kg 75 kg    Examination:   General exam: Appears calm and comfortable  Respiratory system: Clear to auscultation. Respiratory effort normal. Cardiovascular system: S1 & S2 heard, RRR. No JVD, murmurs, rubs, gallops or clicks. No pedal edema. Gastrointestinal system: Abdomen is nondistended, soft and nontender. No organomegaly or masses felt. Normal bowel sounds heard. Central  nervous system: Alert and oriented. No focal neurological deficits. Extremities: Symmetric 5 x 5 power. Skin: No rashes, lesions or ulcers.  Psychiatry: Judgement and insight appear normal. Mood & affect appropriate.    Data Reviewed: I have personally reviewed following labs and imaging studies  CBC: Recent Labs  Lab 03/07/21 1201 03/07/21 1218 03/08/21 0925 03/09/21 0904 03/10/21 0901 03/11/21 1038  WBC 25.3*  --  16.0* 15.9* 14.2* 12.7*  NEUTROABS  --   --  14.9*  --  12.7* 10.8*  HGB 10.9* 11.6* 10.1* 10.4* 10.6* 10.1*  HCT 34.4* 34.0* 32.2* 33.4* 33.7* 31.8*  MCV 80.0  --  79.7* 80.3 78.9* 78.1*  PLT 329  --  297 287 333 503    Basic Metabolic Panel: Recent Labs  Lab 03/08/21 0925 03/09/21 0414 03/10/21 0901 03/11/21 1038 03/12/21 0446  NA 128* 131* 129* 129* 128*  K 3.2* 3.6 3.2* 3.3* 3.8  CL 97* 101 95* 93* 97*  CO2 20* 24 25 28 27   GLUCOSE  142* 103* 123* 174* 134*  BUN 19 21 21 19 16   CREATININE 0.62 0.60* 0.67 0.60* 0.54*  CALCIUM 8.3* 8.7* 8.7* 8.7* 8.8*  MG  --   --   --  1.9 1.9    GFR: Estimated Creatinine Clearance: 95.1 mL/min (A) (by C-G formula based on SCr of 0.54 mg/dL (L)). Liver Function Tests: Recent Labs  Lab 03/07/21 2058  AST 20  ALT 19  ALKPHOS 92  BILITOT 1.5*  PROT 6.5  ALBUMIN 2.8*    No results for input(s): LIPASE, AMYLASE in the last 168 hours. No results for input(s): AMMONIA in the last 168 hours. Coagulation Profile: No results for input(s): INR, PROTIME in the last 168 hours. Cardiac Enzymes: No results for input(s): CKTOTAL, CKMB, CKMBINDEX, TROPONINI in the last 168 hours. BNP (last 3 results) Recent Labs    03/23/20 0922  PROBNP 520*    HbA1C: No results for input(s): HGBA1C in the last 72 hours. CBG: No results for input(s): GLUCAP in the last 168 hours. Lipid Profile: No results for input(s): CHOL, HDL, LDLCALC, TRIG, CHOLHDL, LDLDIRECT in the last 72 hours. Thyroid Function Tests: No results for  input(s): TSH, T4TOTAL, FREET4, T3FREE, THYROIDAB in the last 72 hours. Anemia Panel: No results for input(s): VITAMINB12, FOLATE, FERRITIN, TIBC, IRON, RETICCTPCT in the last 72 hours.    Radiology Studies: I have reviewed all of the imaging during this hospital visit personally  Scheduled Meds:  amoxicillin-clavulanate  1 tablet Oral Q12H   apixaban  5 mg Oral BID   atorvastatin  80 mg Oral QPM   Chlorhexidine Gluconate Cloth  6 each Topical Daily   digoxin  0.125 mg Oral Daily   diphenhydrAMINE  50 mg Oral QHS   ezetimibe  10 mg Oral Daily   furosemide  40 mg Oral Daily   gabapentin  200 mg Oral TID   ivabradine  7.5 mg Oral BID WC   lidocaine  1 patch Transdermal Q24H   melatonin  5 mg Oral QHS   midodrine  5 mg Oral TID WC   pantoprazole  40 mg Oral Daily   potassium chloride  40 mEq Oral BID   sertraline  50 mg Oral Daily   sodium chloride flush  10-40 mL Intracatheter Q12H   tamsulosin  0.4 mg Oral Daily   Continuous Infusions:  magnesium sulfate bolus IVPB 2 g (03/12/21 1009)   milrinone 0.125 mcg/kg/min (03/12/21 0743)      LOS: 4 days    Total time spent in minutes: [place], MD [place]

## 2021-03-12 NOTE — Progress Notes (Addendum)
Patient ID: Brandon [name], male   DOB: 05-10-1956, 65 y.o.   MRN: [id number]     Advanced Heart Failure Rounding Note  PCP-Cardiologist: [name], MD   Subjective:    Admitted with sepsis. Started on cefepime + vanc, now off abx (ID following). Afebrile.  10/27 started on ivabradine 7.5 mg twice a day and midodrine 5 mg tid.  10/29 Co-ox 48%, started on milrinone 0.25.  10/30 Antibiotics stopped. Blood cultures no growth for 5 days.   CO-OX 60% on Milrinone 0.25 mcg.   Oxygen requirement reduced to 2 liters.   Feeling better. Denies SOB.    Objective:   Weight Range: 75 kg Body mass index is 22.42 kg/m.   Vital Signs:   Temp:  [97.5 F (36.4 C)-98.3 F (36.8 C)] 98.1 F (36.7 C) (10/31 0316) Pulse Rate:  [77-88] 82 (10/31 0316) Resp:  [17-24] 20 (10/31 0316) BP: (103-121)/(60-72) 121/67 (10/31 0316) SpO2:  [90 %-99 %] 90 % (10/31 0316) Weight:  [75 kg] 75 kg (10/31 0448) Last BM Date: 03/09/21  Weight change: Filed Weights   03/10/21 0400 03/11/21 0354 03/12/21 0448  Weight: 74.7 kg 74.1 kg 75 kg    Intake/Output:   Intake/Output Summary (Last 24 hours) at 03/12/2021 0726 Last data filed at 03/12/2021 0446 Gross per 24 hour  Intake 390.9 ml  Output 700 ml  Net -309.1 ml      Physical Exam  CVP 4  General:   No resp difficulty HEENT: normal Neck: supple. no JVD. Carotids 2+ bilat; no bruits. No lymphadenopathy or thryomegaly appreciated. Cor: PMI nondisplaced. Regular rate & rhythm. No rubs, gallops or murmurs. Lungs: clear Abdomen: soft, nontender, nondistended. No hepatosplenomegaly. No bruits or masses. Good bowel sounds. Extremities: no cyanosis, clubbing, rash, edema. RUE PICC  Neuro: alert & orientedx3, cranial nerves grossly intact. moves all 4 extremities w/o difficulty. Affect pleasant   Telemetry   SR 80-90s personally reviewed.   EKG    N/A   Labs    CBC Recent Labs    03/10/21 0901 03/11/21 1038  WBC 14.2* 12.7*   NEUTROABS 12.7* 10.8*  HGB 10.6* 10.1*  HCT 33.7* 31.8*  MCV 78.9* 78.1*  PLT 333 350   Basic Metabolic Panel Recent Labs    03/11/21 1038 03/12/21 0446  NA 129* 128*  K 3.3* 3.8  CL 93* 97*  CO2 28 27  GLUCOSE 174* 134*  BUN 19 16  CREATININE 0.60* 0.54*  CALCIUM 8.7* 8.8*  MG 1.9 1.9   Liver Function Tests No results for input(s): AST, ALT, ALKPHOS, BILITOT, PROT, ALBUMIN in the last 72 hours.  No results for input(s): LIPASE, AMYLASE in the last 72 hours. Cardiac Enzymes No results for input(s): CKTOTAL, CKMB, CKMBINDEX, TROPONINI in the last 72 hours.  BNP: BNP (last 3 results) Recent Labs    11/30/20 0144 01/08/21 0731 01/18/21 1629  BNP 2,653.1* 734.2* 607.9*    ProBNP (last 3 results) Recent Labs    03/23/20 0922  PROBNP 520*     D-Dimer No results for input(s): DDIMER in the last 72 hours. Hemoglobin A1C No results for input(s): HGBA1C in the last 72 hours. Fasting Lipid Panel No results for input(s): CHOL, HDL, LDLCALC, TRIG, CHOLHDL, LDLDIRECT in the last 72 hours. Thyroid Function Tests No results for input(s): TSH, T4TOTAL, T3FREE, THYROIDAB in the last 72 hours.  Invalid input(s): FREET3  Other results:   Imaging    No results found.   Medications:  Scheduled Medications:  apixaban  5 mg Oral BID   atorvastatin  80 mg Oral QPM   Chlorhexidine Gluconate Cloth  6 each Topical Daily   digoxin  0.125 mg Oral Daily   diphenhydrAMINE  50 mg Oral QHS   ezetimibe  10 mg Oral Daily   furosemide  40 mg Oral Daily   gabapentin  200 mg Oral TID   ivabradine  7.5 mg Oral BID WC   lidocaine  1 patch Transdermal Q24H   melatonin  5 mg Oral QHS   midodrine  5 mg Oral TID WC   pantoprazole  40 mg Oral Daily   potassium chloride  40 mEq Oral BID   sertraline  50 mg Oral Daily   sodium chloride flush  10-40 mL Intracatheter Q12H   tamsulosin  0.4 mg Oral Daily    Infusions:  milrinone 0.25 mcg/kg/min (03/12/21 0010)    PRN  Medications: acetaminophen **OR** acetaminophen, guaiFENesin, ondansetron **OR** ondansetron (ZOFRAN) IV, sodium chloride flush    Patient Profile   Brandon [name] is a 65 year old with history of smoker, CAD s/p previous MI, HTN, HL, chronic HFeEF, sepsis 11/2020 bacteremia.  Admitted with sepsis. Bld Cx - NGTD    Assessment/Plan   Sepsis - H/o strep bacteremia 11/2020.  ICD extracted 12/04/20. Barostim was not removed as it is extravascular.  - Had been on IV antibiotics until 01/15/21. Blood cultures negative 01/30/21. - WBCs 16 => 14.  Afebrile.  - Procalcitonin 0.22  - 10/26 -Blood CX x2  No growth 5 days.  ?PNA => initially on cefepime/vancomycin, now off both.  ID following.   2. Acute Hypoxic Respiratory Failure  - Prior to admit using oxygen as needed.  - Oxygen requirements reduced to 2 liters.  - CXR with pulmonary edema initially, now has diuresed with IV Lasix and CVP 5 this morning.  -  CT chest completed 10/30 but not read.  ?PNA, ?interstitial lung disease (there are reports of ILD related to ankylosing spondylitis).     3. Chronic Systolic HFr EF - Echo 01/15/16 EF 20-25% RV mildly HK. moderate AS  Mean gradient 13 AVA 1.2 cm2 DI 0.30 - RHC (7/22): EF 20%, low filling pressures. CI 2.3.  - No bb with hypotension.  - Continue ivabradine 7.5 mg twice a day.  - Continue digoxin.  - On midodrine 5 mg tid, BP stable today. Renal function stable.  -CO-OX 60% on milrinone 0.25 mcg.  Wean to 0.125 mcg.  - CVP 4 today. Continue lasix 40 mg daily.      4. CAD  s/p anterior STEMI (12/20). LHC showed chronically occluded RCA (with L>>R collaterals) and thrombotic occlusion of proximal LAD. Underwent PCI of LAD. - LHC (7/22) with stable CAD.  - No chest pain.    5. H./O RUE DVT On eliquis.   Consult cardiac rehab. Ambulate today.   Length of Stay: 4  Amy Clegg, NP  03/12/2021, 7:26 AM  Advanced Heart Failure Team Pager [phone] (M-F; 7a - 5p)  Please contact [facility] for night-coverage after hours (5p -7a ) and weekends on [email or web address]  Patient seen and examined with the above-signed Advanced Practice Provider and/or Housestaff. I personally reviewed laboratory data, imaging studies and relevant notes. I independently examined the patient and formulated the important aspects of the plan. I have edited the note to reflect any of my changes or salient points. I have personally discussed the plan with the patient and/or family.  Started  on milrinone over the weekend.  He feels better today. Co-ox 60% CVP 4. Bcx negative.   I have reviewed hi-res chest CT personally (no report yet). There are bilateral pleural effusions and appears to be some honeycombing in the upper lobes.   General:  Sitting up in bed  No resp difficulty HEENT: normal Neck: supple. no JVD. Carotids 2+ bilat; no bruits. No lymphadenopathy or thryomegaly appreciated. Cor: PMI nondisplaced. Regular rate & rhythm. 2/6 Brandon Lungs: dull at basese Abdomen: soft, nontender, nondistended. No hepatosplenomegaly. No bruits or masses. Good bowel sounds. Extremities: no cyanosis, clubbing, rash, edema Neuro: alert & orientedx3, cranial nerves grossly intact. moves all 4 extremities w/o difficulty. Affect pleasant  Difficult situation. Initial presentation c/w sepsis/recurrent infection but BCx negative. Now off abx. Developed low output HF now improved on milrinone.   Given chest CT and recent events, I doubt he will be transplant candidate. I will review with [name]. Once improved may be best to try and move forward with VAD if we can tune him up sufficiently.   Glori Bickers, MD  7:48 AM

## 2021-03-12 NOTE — Care Management Important Message (Signed)
Important Message  Patient Details  Name: Brandon [name] MRN: [id number] Date of Birth: [birth year]   Medicare Important Message Given:  Yes     Meril Dray 03/12/2021, 2:00 PM

## 2021-03-12 NOTE — Progress Notes (Signed)
VAD coordinator into see pt. Pt states that he was supposed to be at [facility] this week for transplant evaluation. Pt is currently being evaluated for VAD here at [facility] vs. Heart transplant at [facility]. I have added antithrombin III to pts labs for in the morning as well as nutrition and palliative care consults. These will complete the pts evaluation for VAD. We will discuss the pt at our [facility] this evening.  Tanda Rockers RN, BSN VAD Coordinator 24/7 Pager [phone]

## 2021-03-12 NOTE — Progress Notes (Signed)
Mobility Specialist Progress Note    03/12/21 1126  Mobility  Activity Ambulated in hall  Level of Assistance Modified independent, requires aide device or extra time  Assistive Device  (O2 tank)  Distance Ambulated (ft) 270 ft  Mobility Ambulated with assistance in hallway  Mobility Response Tolerated well  Mobility performed by Mobility specialist  Bed Position Chair  $Mobility charge 1 Mobility   Pre-Mobility: 84 HR, 125/63 BP, 97% SpO2 During Mobility: 91% SpO2 Post-Mobility: 93 HR, 97% SpO2  Pt received in bed and agreeable. Took x2 short standing breaks to recover with pursed lip breathing. Encouraged focusing on pursed lip breathing. Returned to chair with call bell in reach.   Hildred Alamin Mobility Specialist  Mobility Specialist Phone: [phone]

## 2021-03-13 DIAGNOSIS — J9621 Acute and chronic respiratory failure with hypoxia: Secondary | ICD-10-CM | POA: Diagnosis not present

## 2021-03-13 DIAGNOSIS — I10 Essential (primary) hypertension: Secondary | ICD-10-CM | POA: Diagnosis not present

## 2021-03-13 DIAGNOSIS — E785 Hyperlipidemia, unspecified: Secondary | ICD-10-CM | POA: Diagnosis not present

## 2021-03-13 DIAGNOSIS — I5022 Chronic systolic (congestive) heart failure: Secondary | ICD-10-CM | POA: Diagnosis not present

## 2021-03-13 LAB — BASIC METABOLIC PANEL
Anion gap: 8 (ref 5–15)
BUN: 14 mg/dL (ref 8–23)
CO2: 25 mmol/L (ref 22–32)
Calcium: 8.9 mg/dL (ref 8.9–10.3)
Chloride: 99 mmol/L (ref 98–111)
Creatinine, Ser: 0.58 mg/dL — ABNORMAL LOW (ref 0.61–1.24)
GFR, Estimated: >60 mL/min (ref >60)
Glucose, Bld: 104 mg/dL — ABNORMAL HIGH (ref 70–99)
Potassium: 4.2 mmol/L (ref 3.5–5.1)
Sodium: 132 mmol/L — ABNORMAL LOW (ref 135–145)

## 2021-03-13 LAB — COOXEMETRY PANEL
Carboxyhemoglobin: 1.7 % — ABNORMAL HIGH (ref 0.5–1.5)
Methemoglobin: 0.8 % (ref 0.0–1.5)
O2 Saturation: 63.4 %
Total hemoglobin: 8.8 g/dL — ABNORMAL LOW (ref 12.0–16.0)

## 2021-03-13 LAB — PREALBUMIN: Prealbumin: 11.2 mg/dL — ABNORMAL LOW (ref 18–38)

## 2021-03-13 LAB — MAGNESIUM: Magnesium: 2 mg/dL (ref 1.7–2.4)

## 2021-03-13 LAB — ANTITHROMBIN III: AntiThromb III Func: 95 % (ref 75–120)

## 2021-03-13 MED ORDER — PROSOURCE PLUS PO LIQD
30.0000 mL | Freq: Three times a day (TID) | ORAL | Status: DC
  Administered 2021-03-15 – 2021-03-26 (×36): 30 mL via ORAL
  Filled 2021-03-13 (×36): qty 30

## 2021-03-13 MED ORDER — BOOST / RESOURCE BREEZE PO LIQD CUSTOM
1.0000 | Freq: Three times a day (TID) | ORAL | Status: DC
Start: 2021-03-13 — End: 2021-03-15

## 2021-03-13 MED ORDER — ADULT MULTIVITAMIN W/MINERALS CH
1.0000 | ORAL_TABLET | Freq: Every day | ORAL | Status: DC
  Administered 2021-03-14 – 2021-04-25 (×42): 1 via ORAL
  Filled 2021-03-13 (×42): qty 1

## 2021-03-13 MED ORDER — POTASSIUM CHLORIDE CRYS ER 20 MEQ PO TBCR
40.0000 meq | EXTENDED_RELEASE_TABLET | Freq: Every day | ORAL | Status: DC
  Administered 2021-03-14 – 2021-03-21 (×8): 40 meq via ORAL
  Filled 2021-03-13 (×8): qty 2

## 2021-03-13 NOTE — Progress Notes (Signed)
Brief VAD coordinator rounding note:   Based off MRB discussion yesterday afternoon will complete the rest of VAD workup while patient hospitalized. Will plan for VAD implant in near future. Lab work complete. Consults requested yesterday for nutrition and palliative care.   Spoke with palliative care team- they will make every effort to see patient as soon as they are able, but they are backed up with consults at this time.   VAD team will continue to follow.   Emerson Monte RN [place] Coordinator  Office: [phone]  24/7 Pager: [phone]

## 2021-03-13 NOTE — Progress Notes (Signed)
Initial Nutrition Assessment  DOCUMENTATION CODES:   Severe malnutrition in context of chronic illness  INTERVENTION:   Multivitamin w/ minerals daily  Boost Breeze po TID, each supplement provides 250 kcal and 9 grams of protein  30 ml ProSource Plus TID, each supplement provides 100 kcals and 15 grams protein.   Encourage good PO intake  NUTRITION DIAGNOSIS:   Severe Malnutrition related to chronic illness (CHF) as evidenced by severe muscle depletion, severe fat depletion.  GOAL:   Patient will meet greater than or equal to 90% of their needs  MONITOR:   PO intake, Supplement acceptance, Weight trends, Labs, I & O's  REASON FOR ASSESSMENT:   Consult LVAD Eval  ASSESSMENT:   65 y.o. male presented to the ED with sudden onset of dyspnea. PMH includes CAD, CHF with ICD, HTN, and on 2L [place] at baseline. Pt admitted with respiratory failure with hypoxia with SIRS.   10/29 - PICC insertion  Pt states that his appetite is a lot better now, that he is trying to eat well so that he can get better. Reports that his appetite was very poor PTA and that he was almost forcing himself to eat. Reports a typical intake of nothing for breakfast, Lunch: sandwich or salad, or hamburger, Dinner: meat, vegetable, and potato. States that he does not eat breakfast because he often was not hungry. Pt also reported that he will eat until he is full and cannot take another bite. Pt states that he does experience some SOB when eating but not always.  Per EMR, pts intake includes 75-100% over the past 3 days.   Pt reports a UBW of 188-190#, states that he has been steady at 170# since his illness exacerbated in July. Per EMR, pt has had a 3% weight loss in ~2 weeks, which is clinically significant for time frame. Although, unable to determine if pt's weight loss is related to fluid retention versus actual dry weight loss.   PLDN discussed ONS with pt, pt states that he cannot stand the taste of  Ensure or Boost. Discussed additional ONS with pt and the benefits of preventing weight loss and preparing pt for surgery. Pt willing to try additional ONS.   Pt being followed and worked up by [facility] team for possible placement.   Medications reviewed and include: Augmentin, Lasix, Protonix, Potassium Chloride Labs reviewed: Sodium 132  Admission Weight: 76.9 kg Current Weight: 75.7 kg  UOP: 900 mL x 24 hrs I/O's: -3.9 L since admit  NUTRITION - FOCUSED PHYSICAL EXAM:  Flowsheet Row Most Recent Value  Orbital Region Severe depletion  Upper Arm Region Moderate depletion  Thoracic and Lumbar Region Moderate depletion  Buccal Region Severe depletion  Temple Region Severe depletion  Clavicle Bone Region Severe depletion  Clavicle and Acromion Bone Region Severe depletion  Scapular Bone Region Severe depletion  Dorsal Hand Severe depletion  Patellar Region Severe depletion  Anterior Thigh Region Severe depletion  Posterior Calf Region Severe depletion  Edema (RD Assessment) None  Hair Reviewed  Eyes Reviewed  Mouth Reviewed  Skin Reviewed  Nails Reviewed       Diet Order:   Diet Order             Diet Heart Room service appropriate? Yes; Fluid consistency: Thin  Diet effective now                   EDUCATION NEEDS:   No education needs have been identified at this time  Skin:  Skin Assessment: Reviewed RN Assessment  Last BM:  03/09/2021  Height:   Ht Readings from Last 1 Encounters:  03/07/21 6' (1.829 m)    Weight:   Wt Readings from Last 1 Encounters:  03/13/21 75.7 kg    Ideal Body Weight:  80.9 kg  BMI:  Body mass index is 22.63 kg/m.  Estimated Nutritional Needs:   Kcal:  7408-1448  Protein:  115-130 grams  Fluid:  >/= 2 L    Zachry Hopfensperger BS, PLDN Clinical Dietitian See AMiON for contact information.

## 2021-03-13 NOTE — Progress Notes (Signed)
PROGRESS NOTE    Brandon [name]  TFT:[id number] DOB: 09-Jan-1956 DOA: 03/07/2021 PCP: [name], MD    Brief Narrative:  Brandon [name] was admitted to the hospital with the working diagnosis of acute on chronic hypoxemic respiratory failure.   65 year old male past medical history for coronary artery disease, systolic heart failure status post AICD, (extracted 7/22 due to infection, completed antibiotic therapy 01/15/2021 with ceftriaxone.), he also has moderate aortic stenosis, hypertension, dyslipidemia, and right upper extremity deep vein thrombosis who presented with acute onset of dyspnea.  Reported not feeling well for about 7 days, intermittent nausea, fatigue and chills.  On the day of hospitalization he had acute onset of severe dyspnea that prompted him to come to the hospital.  His oxygenation at home was 80% despite supplemental oxygen 2 L per nasal cannula.  On his initial physical examination he was afebrile, blood pressure 105/73, heart rate 98, respiratory rate 20, oxygen saturation 98% on 4 L of supplemental oxygen per nasal cannula. He was awake and alert, his lungs had bilateral rales, no wheezing, heart S1-S2, present, rhythmic, positive systolic murmur, abdomen soft nontender, no lower extremity edema.  Chest radiograph with no frank infiltrates, mild bilateral hilar vascular congestion. Patient was placed on broad spectrum antibiotic therapy and supplemental 02 per [place].   Assessment & Plan:   Principal Problem:   Acute on chronic respiratory failure with hypoxia (HCC) Active Problems:   Hyperlipidemia with target LDL less than 70   Essential hypertension   CAD (coronary artery disease)   Ischemic cardiomyopathy   Centrilobular emphysema (HCC)   Normocytic anemia   Respiratory failure (HCC)   Bacteremia   Systemic inflammatory response syndrome (SIRS) (HCC)   Chronic systolic CHF (congestive heart failure) (HCC)   Ankylosing spondylitis (HCC)   Leukocytosis   CAP  (community acquired pneumonia)   ILD (interstitial lung disease) ([place])   Aspiration pneumonia of both upper lobes (HCC)   Acute on chronic hypoxemic respiratory failure, possible pneumonia. Sepsis Procalcitonin unremarkable, chest x-ray also not impressive for pneumonia.  ID was consulted, antibiotics was discontinued. There was some concern of interstitial lung disease due to him having history of ankylosing spondylitis.  high-resolution CT scan shows bronchopneumonia and no indication of ILD.  Patient uses 2 L of oxygen at home.  He was on 8 L until day before yesterday, he was resumed back on milrinone and he improved significantly,, since yesterday he has been on 2 to 3 L.  ID started him back on Augmentin for 7 days due to findings on CT scan but ID and I still believe that major component of his acute on chronic hypoxic respiratory failure is his heart failure and not pneumonia.  2.  Chronic systolic heart failure  echocardiogram from 70/62 with LV systolic function of 37%, with severe dilatation of the left ventricle. Global hypokinesis.  Chest x-ray shows component of some pulmonary edema.  Heart failure team switched from IV Lasix 80 mg twice daily to oral Lasix 40 mg p.o. daily.  He is also on digoxin.  Eplerenone and Farxiga on hold.  No beta-blocker due to hypotension.  Also on ivabradine 7.5 mg twice daily.  He is on milrinone, dose reduced by cardiology.  He is being considered for LVAD.  3. Dyslipidemia. Continue atorvastatin and ezetimibe.   4. Right upper extremity DVT, with neuropathy. Continue anticoagulation with apixaban.  Gabapentin increased to 200 mg 3 times daily. Increase gabapentin to 200 mg tid.   5.  Depression. Continue with sertraline.    6.  BPH: Continue Flomax.  7.  Hypokalemia: Resolved, on a scheduled replacement.  8.  Hyponatremia: 132.  Likely due to heart failure.  DVT prophylaxis: Apixaban   Code Status:    full  Family Communication:   No family at  the bedside     Consultants:  Cardiology     Subjective:  Patient seen and examined.  He still complains of mild exertional shortness of breath but improving slowly.  No new complaint.  Objective: Vitals:   03/12/21 2348 03/13/21 0347 03/13/21 0526 03/13/21 0722  BP:  106/70  (!) 117/56  Pulse: 79 87 93 90  Resp: 18 (!) 21 18 20   Temp:  98 F (36.7 C)  98 F (36.7 C)  TempSrc:  Oral  Oral  SpO2: 97% 97% 94% 98%  Weight:   75.7 kg   Height:        Intake/Output Summary (Last 24 hours) at 03/13/2021 1055 Last data filed at 03/13/2021 0839 Gross per 24 hour  Intake 1192.41 ml  Output 1300 ml  Net -107.59 ml    Filed Weights   03/11/21 0354 03/12/21 0448 03/13/21 0526  Weight: 74.1 kg 75 kg 75.7 kg    Examination:   General exam: Appears calm and comfortable  Respiratory system: Clear to auscultation. Respiratory effort normal. Cardiovascular system: S1 & S2 heard, RRR. No JVD, murmurs, rubs, gallops or clicks. No pedal edema. Gastrointestinal system: Abdomen is nondistended, soft and nontender. No organomegaly or masses felt. Normal bowel sounds heard. Central nervous system: Alert and oriented. No focal neurological deficits. Extremities: Symmetric 5 x 5 power. Skin: No rashes, lesions or ulcers.  Psychiatry: Judgement and insight appear normal. Mood & affect appropriate.    Data Reviewed: I have personally reviewed following labs and imaging studies  CBC: Recent Labs  Lab 03/07/21 1201 03/07/21 1218 03/08/21 0925 03/09/21 0904 03/10/21 0901 03/11/21 1038  WBC 25.3*  --  16.0* 15.9* 14.2* 12.7*  NEUTROABS  --   --  14.9*  --  12.7* 10.8*  HGB 10.9* 11.6* 10.1* 10.4* 10.6* 10.1*  HCT 34.4* 34.0* 32.2* 33.4* 33.7* 31.8*  MCV 80.0  --  79.7* 80.3 78.9* 78.1*  PLT 329  --  297 287 333 458    Basic Metabolic Panel: Recent Labs  Lab 03/09/21 0414 03/10/21 0901 03/11/21 1038 03/12/21 0446 03/13/21 0621  NA 131* 129* 129* 128* 132*  K 3.6 3.2* 3.3*  3.8 4.2  CL 101 95* 93* 97* 99  CO2 24 25 28 27 25   GLUCOSE 103* 123* 174* 134* 104*  BUN 21 21 19 16 14   CREATININE 0.60* 0.67 0.60* 0.54* 0.58*  CALCIUM 8.7* 8.7* 8.7* 8.8* 8.9  MG  --   --  1.9 1.9 2.0    GFR: Estimated Creatinine Clearance: 95.9 mL/min (A) (by C-G formula based on SCr of 0.58 mg/dL (L)). Liver Function Tests: Recent Labs  Lab 03/07/21 2058  AST 20  ALT 19  ALKPHOS 92  BILITOT 1.5*  PROT 6.5  ALBUMIN 2.8*    No results for input(s): LIPASE, AMYLASE in the last 168 hours. No results for input(s): AMMONIA in the last 168 hours. Coagulation Profile: No results for input(s): INR, PROTIME in the last 168 hours. Cardiac Enzymes: No results for input(s): CKTOTAL, CKMB, CKMBINDEX, TROPONINI in the last 168 hours. BNP (last 3 results) Recent Labs    03/23/20 0922  PROBNP 520*    HbA1C: No results  for input(s): HGBA1C in the last 72 hours. CBG: No results for input(s): GLUCAP in the last 168 hours. Lipid Profile: No results for input(s): CHOL, HDL, LDLCALC, TRIG, CHOLHDL, LDLDIRECT in the last 72 hours. Thyroid Function Tests: No results for input(s): TSH, T4TOTAL, FREET4, T3FREE, THYROIDAB in the last 72 hours. Anemia Panel: No results for input(s): VITAMINB12, FOLATE, FERRITIN, TIBC, IRON, RETICCTPCT in the last 72 hours.    Radiology Studies: I have reviewed all of the imaging during this hospital visit personally  Scheduled Meds:  amoxicillin-clavulanate  1 tablet Oral Q12H   apixaban  5 mg Oral BID   atorvastatin  80 mg Oral QPM   Chlorhexidine Gluconate Cloth  6 each Topical Daily   digoxin  0.125 mg Oral Daily   diphenhydrAMINE  50 mg Oral QHS   ezetimibe  10 mg Oral Daily   furosemide  40 mg Oral Daily   gabapentin  200 mg Oral TID   ivabradine  7.5 mg Oral BID WC   lidocaine  1 patch Transdermal Q24H   melatonin  5 mg Oral QHS   midodrine  5 mg Oral TID WC   pantoprazole  40 mg Oral Daily   potassium chloride  40 mEq Oral BID    sertraline  50 mg Oral Daily   sodium chloride flush  10-40 mL Intracatheter Q12H   tamsulosin  0.4 mg Oral Daily   Continuous Infusions:      LOS: 5 days   Total time spent in minutes: [place], MD [place]

## 2021-03-13 NOTE — Progress Notes (Signed)
Mobility Specialist Progress Note    03/13/21 1506  Mobility  Activity Ambulated in hall  Level of Assistance Standby assist, set-up cues, supervision of patient - no hands on  Assistive Device  (IV pole)  Distance Ambulated (ft) 265 ft  Mobility Ambulated with assistance in hallway  Mobility Response Tolerated well  Mobility performed by Mobility specialist  Bed Position Chair  $Mobility charge 1 Mobility   Pt received in bed and agreeable. C/o feeling some lightheadedness and SpO2 dropped to 86% but recovered to 93% when encouraged pursed lip breathing. Returned to chair with call bell in reach.   Hildred Alamin Mobility Specialist  Mobility Specialist Phone: [phone]

## 2021-03-13 NOTE — Evaluation (Addendum)
Occupational Therapy Evaluation/Discharge Patient Details Name: Brandon [name] MRN: [id number] DOB: 1955/05/30 Today's Date: 03/13/2021   History of Present Illness Pt is a 65 y.o. male admitted 03/07/21 with acute on chronic hypoxemic respiratory failure, cardiogenic shock. CT scan which shows bronchopneumonia and no indication of ILD. Of note, pt being evaluated for VAD at [facility] vs heart transplant at [facility]. Other PMH includes CHF, ankylosing spondylitis, bacteremia and  possible endocarditis s/p ICD removal, HTN, depression, arthritis.   Clinical Impression   PTA, pt lives alone and reports Independence in all daily tasks without use of DME. Pt presents now at baseline for ADLs though with current supplemental O2 needs. Provided energy conservation education with handout with focus on IADLs, as pt denies any endurance difficulties with ADLs. Pt also reports continued R hand weakness due to recent blood clot. Provided squeeze ball, theraputty and theraputty HEP to further improve strength of dominant R hand. Encouraged OP OT if this weakness persists but otherwise, anticipate no immediate OT needs at DC. Pt verbalized understanding of all education. OT to sign off at acute level.       Recommendations for follow up therapy are one component of a multi-disciplinary discharge planning process, led by the attending physician.  Recommendations may be updated based on patient status, additional functional criteria and insurance authorization.   Follow Up Recommendations  No OT follow up    Assistance Recommended at Discharge PRN  Functional Status Assessment  Patient has had a recent decline in their functional status and demonstrates the ability to make significant improvements in function in a reasonable and predictable amount of time.  Equipment Recommendations  None recommended by OT    Recommendations for Other Services       Precautions / Restrictions Precautions Precautions:  Fall Precaution Comments: monitor O2 Restrictions Weight Bearing Restrictions: No      Mobility Bed Mobility Overal bed mobility: Independent                  Transfers Overall transfer level: Independent Equipment used: None                      Balance Overall balance assessment: No apparent balance deficits (not formally assessed)   Sitting balance-Leahy Scale: Good       Standing balance-Leahy Scale: Good               High level balance activites: Side stepping;Backward walking;Direction changes;Turns;Sudden stops;Head turns High Level Balance Comments: no overt instability or LOB observed with higher level balance tasks           ADL either performed or assessed with clinical judgement   ADL Overall ADL's : Independent                                       General ADL Comments: reports he has been going to/from bathroom without difficulty, managing ADLs in room and mobilizing when he can. Educated on energy conservation strategies (handout provided) with focus on IADLs as pt denies need for shower chair or difficulties with ADLs.     Vision Baseline Vision/History: 1 Wears glasses Ability to See in Adequate Light: 0 Adequate Patient Visual Report: No change from baseline Vision Assessment?: No apparent visual deficits     Perception     Praxis      Pertinent Vitals/Pain Pain Assessment: No/denies pain Faces Pain  Scale: Hurts little more Pain Location: RUE Pain Descriptors / Indicators: Sore;Discomfort Pain Intervention(s): Monitored during session     Hand Dominance Right   Extremity/Trunk Assessment Upper Extremity Assessment Upper Extremity Assessment: RUE deficits/detail RUE Deficits / Details: hx of DVT in R UE a few months ago, reports decreased pinch and grip strength. Impaired sensation but functional RUE Sensation: decreased light touch RUE Coordination: decreased fine motor   Lower Extremity  Assessment Lower Extremity Assessment: Defer to PT evaluation   Cervical / Trunk Assessment Cervical / Trunk Assessment: Normal   Communication Communication Communication: No difficulties   Cognition Arousal/Alertness: Awake/alert Behavior During Therapy: WFL for tasks assessed/performed Overall Cognitive Status: Within Functional Limits for tasks assessed                                       General Comments  Spo2 94% at rest sitting EOB during session. Provided squeeze ball, theraputty and theraputty HEP for pt to increase strength of R (dominant) hand. Discussed HFNC vs regular canula for O2 line extension and increased in-room independence with RN    Exercises     Shoulder Instructions      Home Living Family/patient expects to be discharged to:: Private residence Living Arrangements: Alone Available Help at Discharge: Family;Neighbor;Available PRN/intermittently Type of Home: House Home Access: Stairs to enter [employer] of Steps: 2 Entrance Stairs-Rails: None Home Layout: One level     Bathroom Shower/Tub: Walk-in shower;Tub/shower unit   Biochemist, clinical: Standard     Home Equipment: Rollator (4 wheels)   Additional Comments: reports getting rollator after last admission, but doesn't use it nor does he intend to      Prior Functioning/Environment Prior Level of Function : Independent/Modified Independent             Mobility Comments: Independent without DME; tries to walk daily, but most recently limited by SOB the past week ADLs Comments: Independent with ADLs, IADLs in the home. Reports he has not been driving as much and sister occasionally helps with groceries. Had been working part time as Solicitor up until a few months ago.        OT Problem List: Decreased coordination;Decreased strength;Impaired sensation      OT Treatment/Interventions:      OT Goals(Current goals can be found in the care  plan section) Acute Rehab OT Goals Patient Stated Goal: for R hand to regain strength OT Goal Formulation: All assessment and education complete, DC therapy  OT Frequency:     Barriers to D/C:            Co-evaluation              AM-PAC OT "6 Clicks" Daily Activity     Outcome Measure Help from another person eating meals?: None Help from another person taking care of personal grooming?: None Help from another person toileting, which includes using toliet, bedpan, or urinal?: None Help from another person bathing (including washing, rinsing, drying)?: None Help from another person to put on and taking off regular upper body clothing?: None Help from another person to put on and taking off regular lower body clothing?: None 6 Click Score: 24   End of Session Equipment Utilized During Treatment: Oxygen Nurse Communication: Mobility status;Other (comment) (O2)  Activity Tolerance: Patient tolerated treatment well Patient left: in bed;with call bell/phone within reach;Other (comment) (sitting EOB with lunch)  OT  Visit Diagnosis: Muscle weakness (generalized) (M62.81)                Time: 2993-7169 OT Time Calculation (min): 27 min Charges:  OT General Charges $OT Visit: 1 Visit OT Evaluation $OT Eval Low Complexity: 1 Low OT Treatments $Therapeutic Activity: 8-22 mins  Malachy Chamber, OTR/L Acute Rehab Services Office: [phone]   Layla Maw 03/13/2021, 12:12 PM

## 2021-03-13 NOTE — Progress Notes (Addendum)
Patient ID: Brandon [name], male   DOB: 24-Aug-1955, 65 y.o.   MRN: [id number]     Advanced Heart Failure Rounding Note  PCP-Cardiologist: [name], MD   Subjective:    Admitted with sepsis. Started on cefepime + vanc, now off abx (ID following). Afebrile.  10/27 started on ivabradine 7.5 mg twice a day and midodrine 5 mg tid.  10/29 Co-ox 48%, started on milrinone 0.25.  10/30 IV Antibiotics stopped. Blood cultures no growth for 5 days.  10/31 started on augmentin. Milrinone cut back to 0.125 mcg.   CO-OX 63%. On milrinone 0.125 mcg.  Feels better. SOB with exertion .  Objective:   Weight Range: 75.7 kg Body mass index is 22.63 kg/m.   Vital Signs:   Temp:  [97.7 F (36.5 C)-98.5 F (36.9 C)] 98 F (36.7 C) (11/01 0722) Pulse Rate:  [79-93] 90 (11/01 0722) Resp:  [16-21] 20 (11/01 0722) BP: (98-125)/(56-70) 117/56 (11/01 0722) SpO2:  [94 %-98 %] 98 % (11/01 0722) Weight:  [75.7 kg] 75.7 kg (11/01 0526) Last BM Date: 03/09/21  Weight change: Filed Weights   03/11/21 0354 03/12/21 0448 03/13/21 0526  Weight: 74.1 kg 75 kg 75.7 kg    Intake/Output:   Intake/Output Summary (Last 24 hours) at 03/13/2021 0810 Last data filed at 03/13/2021 0700 Gross per 24 hour  Intake 1192.41 ml  Output 900 ml  Net 292.41 ml      Physical Exam  CVP 4.  General:  No resp difficulty HEENT: normal Neck: supple. no JVD. Carotids 2+ bilat; no bruits. No lymphadenopathy or thryomegaly appreciated. Cor: PMI nondisplaced. Regular rate & rhythm. No rubs, gallops or murmurs. Lungs: Decreased in the bases otherwise clear. On 2 liters [place].  Abdomen: soft, nontender, nondistended. No hepatosplenomegaly. No bruits or masses. Good bowel sounds. Extremities: no cyanosis, clubbing, rash, edema. RUE PICC Neuro: alert & orientedx3, cranial nerves grossly intact. moves all 4 extremities w/o difficulty. Affect pleasant   Telemetry   SR 80-90s personally checked.  EKG    N/A   Labs     CBC Recent Labs    03/10/21 0901 03/11/21 1038  WBC 14.2* 12.7*  NEUTROABS 12.7* 10.8*  HGB 10.6* 10.1*  HCT 33.7* 31.8*  MCV 78.9* 78.1*  PLT 333 998   Basic Metabolic Panel Recent Labs    03/12/21 0446 03/13/21 0621  NA 128* 132*  K 3.8 4.2  CL 97* 99  CO2 27 25  GLUCOSE 134* 104*  BUN 16 14  CREATININE 0.54* 0.58*  CALCIUM 8.8* 8.9  MG 1.9 2.0   Liver Function Tests No results for input(s): AST, ALT, ALKPHOS, BILITOT, PROT, ALBUMIN in the last 72 hours.  No results for input(s): LIPASE, AMYLASE in the last 72 hours. Cardiac Enzymes No results for input(s): CKTOTAL, CKMB, CKMBINDEX, TROPONINI in the last 72 hours.  BNP: BNP (last 3 results) Recent Labs    11/30/20 0144 01/08/21 0731 01/18/21 1629  BNP 2,653.1* 734.2* 607.9*    ProBNP (last 3 results) Recent Labs    03/23/20 0922  PROBNP 520*     D-Dimer No results for input(s): DDIMER in the last 72 hours. Hemoglobin A1C No results for input(s): HGBA1C in the last 72 hours. Fasting Lipid Panel No results for input(s): CHOL, HDL, LDLCALC, TRIG, CHOLHDL, LDLDIRECT in the last 72 hours. Thyroid Function Tests No results for input(s): TSH, T4TOTAL, T3FREE, THYROIDAB in the last 72 hours.  Invalid input(s): FREET3  Other results:   Imaging  No results found.   Medications:     Scheduled Medications:  amoxicillin-clavulanate  1 tablet Oral Q12H   apixaban  5 mg Oral BID   atorvastatin  80 mg Oral QPM   Chlorhexidine Gluconate Cloth  6 each Topical Daily   digoxin  0.125 mg Oral Daily   diphenhydrAMINE  50 mg Oral QHS   ezetimibe  10 mg Oral Daily   furosemide  40 mg Oral Daily   gabapentin  200 mg Oral TID   ivabradine  7.5 mg Oral BID WC   lidocaine  1 patch Transdermal Q24H   melatonin  5 mg Oral QHS   midodrine  5 mg Oral TID WC   pantoprazole  40 mg Oral Daily   potassium chloride  40 mEq Oral BID   sertraline  50 mg Oral Daily   sodium chloride flush  10-40 mL  Intracatheter Q12H   tamsulosin  0.4 mg Oral Daily    Infusions:  milrinone 0.125 mcg/kg/min (03/13/21 0538)    PRN Medications: acetaminophen **OR** acetaminophen, guaiFENesin, ondansetron **OR** ondansetron (ZOFRAN) IV, sodium chloride flush    Patient Profile   Brandon [name] is a 65 year old with history of smoker, CAD s/p previous MI, HTN, HL, chronic HFeEF, sepsis 11/2020 bacteremia.  Admitted with sepsis. Bld Cx - NGTD    Assessment/Plan   Sepsis - H/o strep bacteremia 11/2020.  ICD extracted 12/04/20. Barostim was not removed as it is extravascular.  - Had been on IV antibiotics until 01/15/21. Blood cultures negative 01/30/21. - WBCs 16 => 14.  Afebrile.  - Procalcitonin 0.22  - 10/26 -Blood CX x2  No growth 5 days.  -Ct chest concerning for pneumonia.  ?PNA => initially on cefepime/vancomycin, now off both.   - Started Augmentin 10/31 -ID following.   2. Acute Hypoxic Respiratory Failure  - Prior to admit using oxygen as needed.  - Oxygen requirements reduced to 2 liters.  - CXR with pulmonary edema initially, now has diuresed with IV Lasix and CVP 4-5  this morning.  -  CT chest completed 10/30 concerning for bronchopneumonia. On augmentin.    3. Chronic Systolic HFr EF - Echo 05/18/05 EF 20-25% RV mildly HK. moderate AS  Mean gradient 13 AVA 1.2 cm2 DI 0.30 - RHC (7/22): EF 20%, low filling pressures. CI 2.3.  - No bb with hypotension.  - Continue ivabradine 7.5 mg twice a day.  - Continue digoxin.  - On midodrine 5 mg tid,  BP ok.  -CO-OX 63% on milrinone 0.125 mcg.  Stop milrinone. Will need to watch CO-OX over the next couple days.  - CVP 4 today. Continue lasix 40 mg daily.    - Renal function stable.  - Possible LVAD.    4. CAD  s/p anterior STEMI (12/20). LHC showed chronically occluded RCA (with L>>R collaterals) and thrombotic occlusion of proximal LAD. Underwent PCI of LAD. - LHC (7/22) with stable CAD.  - No chest pain.    5. H./O RUE DVT On eliquis.      Length of Stay: [place], NP  03/13/2021, 8:10 AM  Advanced Heart Failure Team Pager [phone] (M-F; 7a - 5p)  Please contact [facility] for night-coverage after hours (5p -7a ) and weekends on [email or web address]  Patient seen and examined with the above-signed Advanced Practice Provider and/or Housestaff. I personally reviewed laboratory data, imaging studies and relevant notes. I independently examined the patient and formulated the important aspects of the plan. I  have edited the note to reflect any of my changes or salient points. I have personally discussed the plan with the patient and/or family.  Remains on milrinone 0.125. Co-ox 63% CVP 4. SOB improving. No orthopnea or PND.   General:  Weak appearing. No resp difficulty HEENT: normal Neck: supple. no JVD. Carotids 2+ bilat; no bruits. No lymphadenopathy or thryomegaly appreciated. Cor: PMI nondisplaced. Regular rate & rhythm. 2/6 SEM RUSB Lungs: fine crackles Abdomen: soft, nontender, nondistended. No hepatosplenomegaly. No bruits or masses. Good bowel sounds. Extremities: no cyanosis, clubbing, rash, edema Neuro: alert & orientedx3, cranial nerves grossly intact. moves all 4 extremities w/o difficulty. Affect pleasant  Remains tenuous. Discussed at [facility] yesterday possible VAD early December.   Will try milrinone wean but suspect he may need home inotropes for a period. Continue abx for multilobar PNA. Needs to ambulate. Will consult PT/OT. Check prealbumin.   Glori Bickers, MD  8:51 AM

## 2021-03-13 NOTE — Evaluation (Signed)
Physical Therapy Evaluation Patient Details Name: CARDEN TEEL MRN: [id number] DOB: [birth year] Today's Date: 03/13/2021  History of Present Illness  Pt is a 65 y.o. male admitted 03/07/21 with acute on chronic hypoxemic respiratory failure, cardiogenic shock. CT scan which shows bronchopneumonia and no indication of ILD. Of note, pt being evaluated for VAD at [facility] vs heart transplant at [facility]. Other PMH includes CHF, ankylosing spondylitis, bacteremia and  possible endocarditis s/p ICD removal, HTN, depression, arthritis.   Clinical Impression  Pt presents with an overall decrease in functional mobility secondary to above. PTA, pt independent without DME, lives alone, activity limited by SOB the past few days. Today, pt moving well without assist, limited by DOE 3-4/4 with brief bouts of standing activity and ambulation. Educ re: activity recommendations, energy conservation strategies, pursed lip breathing. Pt would benefit from continued acute PT services to maximize functional mobility and independence prior to d/c home.      SpO2 90-92% on 2L O2 with activity HR up to 108   Recommendations for follow up therapy are one component of a multi-disciplinary discharge planning process, led by the attending physician.  Recommendations may be updated based on patient status, additional functional criteria and insurance authorization.  Follow Up Recommendations No PT follow up (Cardiac Rehab Phase II?)    Assistance Recommended at Discharge PRN  Functional Status Assessment Patient has had a recent decline in their functional status and demonstrates the ability to make significant improvements in function in a reasonable and predictable amount of time.  Equipment Recommendations  None recommended by PT    Recommendations for Other Services       Precautions / Restrictions Precautions Precautions: Fall Restrictions Weight Bearing Restrictions: No      Mobility  Bed Mobility Overal bed  mobility: Independent                  Transfers Overall transfer level: Independent Equipment used: None                    Ambulation/Gait Ambulation/Gait assistance: Modified independent (Device/Increase time)   Assistive device: None;IV Pole Gait Pattern/deviations: Step-through pattern;Decreased stride length Gait velocity: Decreased   General Gait Details: Slow, steady gait mod indep with and without pushing IV pole; pt initially up to use bathroom, requiring prolonged seated rest prior to progressing to hallway ambulation; DOE 3/4 with activity, SpO2 91% on 2L O2 [place]  Stairs            Wheelchair Mobility    Modified Rankin (Stroke Patients Only)       Balance Overall balance assessment: No apparent balance deficits (not formally assessed)   Sitting balance-Leahy Scale: Good       Standing balance-Leahy Scale: Good               High level balance activites: Side stepping;Backward walking;Direction changes;Turns;Sudden stops;Head turns High Level Balance Comments: no overt instability or LOB observed with higher level balance tasks             Pertinent Vitals/Pain Pain Assessment: Faces Faces Pain Scale: Hurts little more Pain Location: RUE Pain Descriptors / Indicators: Sore;Discomfort Pain Intervention(s): Monitored during session    Home Living Family/patient expects to be discharged to:: Private residence Living Arrangements: Alone Available Help at Discharge: Family;Neighbor;Available PRN/intermittently Type of Home: House Home Access: Stairs to enter Entrance Stairs-Rails: None Entrance Stairs-Number of Steps: 2   Home Layout: One level Home Equipment: Rollator (4 wheels) Additional Comments: reports  getting rollator after last admission, but doesn't use it nor does he intend to    Prior Function Prior Level of Function : Independent/Modified Independent             Mobility Comments: Independent without DME;  tries to walk daily, but most recently limited by SOB the past week       Hand Dominance        Extremity/Trunk Assessment   Upper Extremity Assessment Upper Extremity Assessment: Overall WFL for tasks assessed    Lower Extremity Assessment Lower Extremity Assessment: Overall WFL for tasks assessed       Communication   Communication: No difficulties  Cognition Arousal/Alertness: Awake/alert Behavior During Therapy: WFL for tasks assessed/performed Overall Cognitive Status: Within Functional Limits for tasks assessed                                          General Comments General comments (skin integrity, edema, etc.): SpO2 90-92% on 2L O2 with activity, up to 95% with seated rest and pursed lip breathing. Initiated educ re: activity recommendations, energy conservation strategies    Exercises     Assessment/Plan    PT Assessment Patient needs continued PT services  PT Problem List Decreased activity tolerance;Decreased mobility;Cardiopulmonary status limiting activity       PT Treatment Interventions DME instruction;Gait training;Stair training;Functional mobility training;Therapeutic activities;Therapeutic exercise;Patient/family education    PT Goals (Current goals can be found in the Care Plan section)  Acute Rehab PT Goals Patient Stated Goal: Improved breathing PT Goal Formulation: With patient Time For Goal Achievement: 03/27/21 Potential to Achieve Goals: Good    Frequency Min 2X/week   Barriers to discharge        Co-evaluation               AM-PAC PT "6 Clicks" Mobility  Outcome Measure Help needed turning from your back to your side while in a flat bed without using bedrails?: None Help needed moving from lying on your back to sitting on the side of a flat bed without using bedrails?: None Help needed moving to and from a bed to a chair (including a wheelchair)?: None Help needed standing up from a chair using your arms  (e.g., wheelchair or bedside chair)?: None Help needed to walk in hospital room?: None Help needed climbing 3-5 steps with a railing? : A Little 6 Click Score: 23    End of Session Equipment Utilized During Treatment: Oxygen Activity Tolerance: Patient tolerated treatment well Patient left: in chair;with call bell/phone within reach;with nursing/sitter in room Nurse Communication: Mobility status PT Visit Diagnosis: Other abnormalities of gait and mobility (R26.89)    Time: 1660-6301 PT Time Calculation (min) (ACUTE ONLY): 20 min   Charges:   PT Evaluation $PT Eval Low Complexity: 1 Low     Mabeline Caras, PT, DPT Acute Rehabilitation Services  Pager [phone] Office [place] 03/13/2021, 10:02 AM

## 2021-03-14 ENCOUNTER — Encounter (HOSPITAL_COMMUNITY): Payer: Medicare Other

## 2021-03-14 ENCOUNTER — Other Ambulatory Visit (HOSPITAL_COMMUNITY): Payer: Self-pay | Admitting: Unknown Physician Specialty

## 2021-03-14 DIAGNOSIS — J189 Pneumonia, unspecified organism: Secondary | ICD-10-CM | POA: Diagnosis not present

## 2021-03-14 DIAGNOSIS — E43 Unspecified severe protein-calorie malnutrition: Secondary | ICD-10-CM | POA: Insufficient documentation

## 2021-03-14 DIAGNOSIS — I10 Essential (primary) hypertension: Secondary | ICD-10-CM | POA: Diagnosis not present

## 2021-03-14 DIAGNOSIS — I5023 Acute on chronic systolic (congestive) heart failure: Secondary | ICD-10-CM

## 2021-03-14 DIAGNOSIS — R531 Weakness: Secondary | ICD-10-CM | POA: Diagnosis not present

## 2021-03-14 DIAGNOSIS — I5022 Chronic systolic (congestive) heart failure: Secondary | ICD-10-CM | POA: Diagnosis not present

## 2021-03-14 DIAGNOSIS — Z515 Encounter for palliative care: Secondary | ICD-10-CM

## 2021-03-14 DIAGNOSIS — I255 Ischemic cardiomyopathy: Secondary | ICD-10-CM | POA: Diagnosis not present

## 2021-03-14 DIAGNOSIS — Z7189 Other specified counseling: Secondary | ICD-10-CM | POA: Diagnosis not present

## 2021-03-14 DIAGNOSIS — J9621 Acute and chronic respiratory failure with hypoxia: Secondary | ICD-10-CM | POA: Diagnosis not present

## 2021-03-14 LAB — BASIC METABOLIC PANEL
Anion gap: 5 (ref 5–15)
BUN: 13 mg/dL (ref 8–23)
CO2: 26 mmol/L (ref 22–32)
Calcium: 8.8 mg/dL — ABNORMAL LOW (ref 8.9–10.3)
Chloride: 99 mmol/L (ref 98–111)
Creatinine, Ser: 0.5 mg/dL — ABNORMAL LOW (ref 0.61–1.24)
GFR, Estimated: >60 mL/min (ref >60)
Glucose, Bld: 96 mg/dL (ref 70–99)
Potassium: 4 mmol/L (ref 3.5–5.1)
Sodium: 130 mmol/L — ABNORMAL LOW (ref 135–145)

## 2021-03-14 LAB — DIGOXIN LEVEL: Digoxin Level: 0.3 ng/mL — ABNORMAL LOW (ref 0.8–2.0)

## 2021-03-14 LAB — COOXEMETRY PANEL
Carboxyhemoglobin: 1.4 % (ref 0.5–1.5)
Carboxyhemoglobin: 1.3 % (ref 0.5–1.5)
Methemoglobin: 0.7 % (ref 0.0–1.5)
Methemoglobin: 0.4 % (ref 0.0–1.5)
O2 Saturation: 54.3 %
O2 Saturation: 61 %
Total hemoglobin: 9.3 g/dL — ABNORMAL LOW (ref 12.0–16.0)
Total hemoglobin: 9.6 g/dL — ABNORMAL LOW (ref 12.0–16.0)

## 2021-03-14 LAB — CBC
HCT: 30.9 % — ABNORMAL LOW (ref 39.0–52.0)
Hemoglobin: 9.8 g/dL — ABNORMAL LOW (ref 13.0–17.0)
MCH: 24.8 pg — ABNORMAL LOW (ref 26.0–34.0)
MCHC: 31.7 g/dL (ref 30.0–36.0)
MCV: 78.2 fL — ABNORMAL LOW (ref 80.0–100.0)
Platelets: 347 10*3/uL (ref 150–400)
RBC: 3.95 MIL/uL — ABNORMAL LOW (ref 4.22–5.81)
RDW: 17.1 % — ABNORMAL HIGH (ref 11.5–15.5)
WBC: 13.1 10*3/uL — ABNORMAL HIGH (ref 4.0–10.5)
nRBC: 0 % (ref 0.0–0.2)

## 2021-03-14 LAB — MAGNESIUM: Magnesium: 2 mg/dL (ref 1.7–2.4)

## 2021-03-14 MED ORDER — MILRINONE LACTATE IN DEXTROSE 20-5 MG/100ML-% IV SOLN
0.3750 ug/kg/min | INTRAVENOUS | Status: DC
Start: 2021-03-14 — End: 2021-04-06
  Administered 2021-03-14 – 2021-03-25 (×9): 0.125 ug/kg/min via INTRAVENOUS
  Administered 2021-03-26 – 2021-03-27 (×2): 0.25 ug/kg/min via INTRAVENOUS
  Administered 2021-03-28: 0.375 ug/kg/min via INTRAVENOUS
  Administered 2021-03-28: 0.25 ug/kg/min via INTRAVENOUS
  Administered 2021-03-29 – 2021-04-05 (×15): 0.375 ug/kg/min via INTRAVENOUS
  Filled 2021-03-14 (×28): qty 100

## 2021-03-14 MED ORDER — MIDODRINE HCL 5 MG PO TABS
10.0000 mg | ORAL_TABLET | Freq: Three times a day (TID) | ORAL | Status: DC
  Administered 2021-03-14 – 2021-04-10 (×78): 10 mg via ORAL
  Filled 2021-03-14 (×79): qty 2

## 2021-03-14 NOTE — Progress Notes (Signed)
Mobility Specialist Progress Note    03/14/21 1226  Mobility  Activity Ambulated in hall  Level of Assistance Standby assist, set-up cues, supervision of patient - no hands on  Assistive Device  (IV pole)  Distance Ambulated (ft) 265 ft  Mobility Ambulated with assistance in hallway  Mobility Response Tolerated fair  Mobility performed by Mobility specialist  $Mobility charge 1 Mobility   Pre-Mobility: 82 HR, 97/59 BP, 80% SpO2 During Mobility: 82% SpO2 Post-Mobility: 80 HR, 101/69 BP, 98% SpO2  Pt received in chair and agreeable. No complaints on walk. Took 4-5 standing breaks when SpO2 dropped below 88% but encouraged pursed lip breathing and quickly recovered to 97%. Returned to bed with call bell in reach. Ambulated on 2LO2.   Hildred Alamin Mobility Specialist  Mobility Specialist Phone: [phone]

## 2021-03-14 NOTE — Consult Note (Signed)
Consultation Note Date: 03/14/2021   Patient Name: Brandon [name]  DOB: 1955-06-19  MRN: [id number]  Age / Sex: 65 y.o., male  PCP: [name], MD Referring Physician: Aileen Fass, Tammi Klippel, MD  Reason for Consultation: LVAD evaluation   HPI/Patient Profile: 65 y.o. male   admitted on 03/07/2021 with past medical history significant of CAD with previous MI, chronic systolic CHF/ICM w/ [place] ICD that was extracted 7/22, moderate aortic stenosis, HTN, HLD, hx of recent right UE DVT who presents with sudden onset dyspnea.  Recent bacteremia with icd removal 7/22, s/p 6 weeks ceftriaxone.  Admitted with sepsis.  ID consulted.   Chronic Systolic HFr EF/ EF 75%, CAD. Prealbumin low at 11.2.   VAD w/u underway.      Clinical Assessment and Goals of Care:   This NP Wadie Lessen reviewed medical records, received report from team, assessed the patient and then meet at the bedside to discuss advanced directives and a preparedness plan in light of anticipated LVAD therapy/intervention.     I also spoke by phone with his sister Brandon [name] support person regarding anticipated LVAD therapy.  Both the patient and his sister verbalized an understanding of the seriousness of the current medical situation and the complexity of LVAD surgery and ongoing treatment plan.  Both are optimistic and hopeful that the LVAD will offer continued quality of life.  Concepts specific to future possibilities of -long term ventilation -artificial feeding and hydration -long term antibiotic use -dialysis -psychological adjustments -need to terminate the pump- Other chronic or terminal disease unrelated to cardiac LVAD,  were explored  Patient was able to verbalize  and his sister understands  the importance of quality of life to Mr Schoch.     He is hopeful  that LVAD procedure will increase              quality of  life,  " I just want to feel normal again, maybe play golf"  He is optimistic and "ready, to take this on"     He understands the risks and benefits of the procedure as it relates to his future.  At this time patient is open to all available medical interventions to prolong life and the success of the LVAD therapy. He shared and verbalized an understanding that his sister  would know when the burdens of treatment would outweigh the benefits and trusts in his sister's  support at that time.   Patient and family were encouraged to continue conversation into the future as it is vital for the patient centered care.  Patient hopes to secure H POA and advanced care planning documents while here in the hospital with the supportive spiritual care department.  Will put in referral.   No documented H POA the patient clearly verbalizes that his sister/Patty then Sistine would be his voice in the event that he could not make decisions for himself.       Primary Diagnoses: Present on Admission:  Hyperlipidemia with target LDL  less than 70  Essential hypertension  Acute on chronic respiratory failure with hypoxia (HCC)  Systemic inflammatory response syndrome (SIRS) (HCC)  Ischemic cardiomyopathy  Chronic systolic CHF (congestive heart failure) (HCC)  Centrilobular emphysema (HCC)  (Resolved) Acute on chronic respiratory failure (HCC)  CAD (coronary artery disease)  Normocytic anemia  Respiratory failure ([place])   I have reviewed the medical record, interviewed the patient and family, and examined the patient. The following aspects are pertinent.  Past Medical History:  Diagnosis Date   AICD (automatic cardioverter/defibrillator) present 08/31/2019   Ankylosing spondylitis ([place])    Arthritis    BENIGN PROSTATIC HYPERTROPHY 06/07/2008   CHF (congestive heart failure) ([place])    COLONIC POLYPS, HX OF 06/07/2008   Depression    H/O hiatal hernia    Heart failure ([place])    HYPERLIPIDEMIA  06/07/2008   HYPERTENSION 06/07/2008   Myocardial infarction [facility]) 2005   NSTEMI, s/p LAD stent   NEPHROLITHIASIS, HX OF 06/07/2008   STEMI (ST elevation myocardial infarction) ([place]) 04/27/2019   Social History   Socioeconomic History   Marital status: Single    Spouse name: Not on file   Number of children: Not on file   Years of education: Not on file   Highest education level: Not on file  Occupational History   Occupation: Best boy: [employer]  Tobacco Use   Smoking status: Former    Packs/day: 1.00    Years: 40.00    Pack years: 40.00    Types: Cigarettes    Quit date: 07/26/2019    Years since quitting: 1.6   Smokeless tobacco: Never   Tobacco comments:    1-1.5 pks per year x 40-45 yrs  Vaping Use   Vaping Use: Never used  Substance and Sexual Activity   Alcohol use: Yes    Alcohol/week: 7.0 standard drinks    Types: 7 Shots of liquor per week    Comment: "2 vodka tonics a night"   Drug use: Yes    Comment: occasionally - CBD oil   Sexual activity: Not on file  Other Topics Concern   Not on file  Social History Narrative   Lives in [place] alone   Works part time for a [employer] in [place] Strain: Low Risk    Difficulty of Paying Living Expenses: Not very hard  Food Insecurity: No Food Insecurity   Worried About Charity fundraiser in the Last Year: Never true   Arboriculturist in the Last Year: Never true  Transportation Needs: No Transportation Needs   Lack of Transportation (Medical): No   Lack of Transportation (Non-Medical): No  Physical Activity: Sufficiently Active   Days of Exercise per Week: 5 days   Minutes of Exercise per Session: 40 min  Stress: No Stress Concern Present   Feeling of Stress : Not at all  Social Connections: Socially Isolated   Frequency of Communication with Friends and Family: More than three times a week   Frequency of Social  Gatherings with Friends and Family: More than three times a week   Attends Religious Services: Never   Marine scientist or Organizations: No   Attends Archivist Meetings: Never   Marital Status: Never married   Family History  Problem Relation Age of Onset   Depression Father    Arthritis Sister        RA  Heart failure Mother    Other Neg Hx    Colon cancer Neg Hx    Esophageal cancer Neg Hx    Rectal cancer Neg Hx    Stomach cancer Neg Hx    Colon polyps Neg Hx    Scheduled Meds:  (feeding supplement) PROSource Plus  30 mL Oral TID BM   amoxicillin-clavulanate  1 tablet Oral Q12H   apixaban  5 mg Oral BID   atorvastatin  80 mg Oral QPM   Chlorhexidine Gluconate Cloth  6 each Topical Daily   digoxin  0.125 mg Oral Daily   diphenhydrAMINE  50 mg Oral QHS   ezetimibe  10 mg Oral Daily   feeding supplement  1 Container Oral TID BM   furosemide  40 mg Oral Daily   gabapentin  200 mg Oral TID   ivabradine  7.5 mg Oral BID WC   lidocaine  1 patch Transdermal Q24H   melatonin  5 mg Oral QHS   midodrine  10 mg Oral TID WC   multivitamin with minerals  1 tablet Oral Daily   pantoprazole  40 mg Oral Daily   potassium chloride  40 mEq Oral Daily   sertraline  50 mg Oral Daily   sodium chloride flush  10-40 mL Intracatheter Q12H   tamsulosin  0.4 mg Oral Daily   Continuous Infusions: PRN Meds:.acetaminophen **OR** acetaminophen, guaiFENesin, ondansetron **OR** ondansetron (ZOFRAN) IV, sodium chloride flush Medications Prior to Admission:  Prior to Admission medications   Medication Sig Start Date End Date Taking? Authorizing Provider  acetaminophen (TYLENOL) 500 MG tablet Take 1,000 mg by mouth every 6 (six) hours as needed (pain.).   Yes [provider]  apixaban (ELIQUIS) 5 MG TABS tablet Take 1 tablet (5 mg total) by mouth 2 (two) times daily. 12/11/20  Yes Clegg, Amy D, NP  atorvastatin (LIPITOR) 80 MG tablet TAKE 1 TABLET BY MOUTH ONCE DAILY AT  6PM. Patient taking differently: Take 80 mg by mouth every evening. 06/19/20  Yes Hilty, Nadean Corwin, MD  Carboxymethylcellulose Sodium (ARTIFICIAL TEARS OP) Place 1 drop into both eyes 3 (three) times daily as needed (for dryness or irritation).   Yes [provider]  dapagliflozin propanediol (FARXIGA) 10 MG TABS tablet Take 1 tablet (10 mg total) by mouth in the morning. 12/11/20  Yes Clegg, Amy D, NP  digoxin (LANOXIN) 0.125 MG tablet Take 1 tablet (0.125 mg total) by mouth daily. 12/12/20  Yes Clegg, Amy D, NP  diphenhydrAMINE (BENADRYL) 25 MG tablet Take 50 mg by mouth at bedtime.   Yes [provider]  eplerenone (INSPRA) 50 MG tablet Take 0.5 tablets (25 mg total) by mouth in the morning. 12/11/20  Yes Clegg, Amy D, NP  ezetimibe (ZETIA) 10 MG tablet TAKE ONE TABLET BY MOUTH DAILY Patient taking differently: Take 10 mg by mouth daily. 05/30/20  Yes Lelon Perla, MD  furosemide (LASIX) 40 MG tablet Take 1 tablet (40 mg total) by mouth daily. Take extra tab on Monday and Friday 01/18/21  Yes Bensimhon, Shaune Pascal, MD  gabapentin (NEURONTIN) 100 MG capsule Take 1 capsule (100 mg total) by mouth 4 (four) times daily. 01/30/21  Yes Bensimhon, Shaune Pascal, MD  ivabradine (CORLANOR) 7.5 MG TABS tablet Take 1 tablet (7.5 mg total) by mouth 2 (two) times daily with a meal. 12/11/20  Yes Clegg, Amy D, NP  melatonin 5 MG TABS Take 1 tablet (5 mg total) by mouth at bedtime. 12/11/20  Yes Clegg,  Amy D, NP  pantoprazole (PROTONIX) 40 MG tablet Take 1 tablet (40 mg total) by mouth daily. 01/30/21  Yes Bensimhon, Shaune Pascal, MD  potassium chloride SA (KLOR-CON) 20 MEQ tablet Take 1 tablet (20 mEq total) by mouth daily. 01/18/21  Yes Bensimhon, Shaune Pascal, MD  sertraline (ZOLOFT) 25 MG tablet Take 50 mg by mouth daily.   Yes [provider]  tamsulosin (FLOMAX) 0.4 MG CAPS capsule TAKE 1 CAPSULE(0.4 MG) BY MOUTH DAILY Patient taking differently: Take 0.4 mg by mouth daily. 10/03/20  Yes [name], MD    No Active Allergies Review of Systems  Constitutional:  Positive for fatigue.  Respiratory:  Positive for shortness of breath.   Neurological:  Positive for weakness.   Physical Exam Cardiovascular:     Rate and Rhythm: Normal rate.  Skin:    General: Skin is warm and dry.  Neurological:     Mental Status: He is alert.    Vital Signs: BP (!) 97/59   Pulse 79   Temp 98.7 F (37.1 C) (Oral)   Resp (!) 25   Ht 6' (1.829 m)   Wt 75.8 kg   SpO2 98%   BMI 22.66 kg/m  Pain Scale: 0-10 POSS *See Group Information*: 1-Acceptable,Awake and alert Pain Score: 0-No pain   SpO2: SpO2: 98 % O2 Device:SpO2: 98 % O2 Flow Rate: .O2 Flow Rate (L/min): 2 L/min  IO: Intake/output summary:  Intake/Output Summary (Last 24 hours) at 03/14/2021 1204 Last data filed at 03/14/2021 0900 Gross per 24 hour  Intake 610 ml  Output 1100 ml  Net -490 ml    LBM: Last BM Date: 03/13/21 Baseline Weight: Weight: 76.9 kg (scale B) Most recent weight: Weight: 75.8 kg     Palliative Assessment/Data:   Discussed with Tressia Danas with LVAD team   PMT will continue to support holistically, patient and his family are encouraged to call with questions or concerns.  Time In: 1100 Time Out: 1210 Time Total: 70  Greater than 50%  of this time was spent counseling and coordinating care related to the above assessment and plan.  Signed by: Wadie Lessen, NP   Please contact Palliative Medicine Team phone at [phone] for questions and concerns.  For individual provider: See Shea Evans

## 2021-03-14 NOTE — Progress Notes (Addendum)
Patient ID: Brandon [name], male   DOB: August 08, 1955, 65 y.o.   MRN: [id number]     Advanced Heart Failure Rounding Note  PCP-Cardiologist: Brandon Ruths, MD   Subjective:    Admitted with sepsis. Started on cefepime + vanc, now off abx (ID following). Afebrile.  10/27 started on ivabradine 7.5 mg twice a day and midodrine 5 mg tid.  10/29 Co-ox 48%, started on milrinone 0.25.  10/30 IV Antibiotics stopped. Blood cultures no growth for 5 days.  10/31 started on Augmentin for PNA. Milrinone cut back to 0.125 mcg.   Milrinone stopped yesterday. Co-ox 63>>54%.   Had symptoms of orthopnea last night and didn't sleep well. Moved from bed to chair. CVP only 4 on my read. Wt up 1 lb. 1.5L in UOP yesterday w/ Lasix.   Scr stable 0.5  On Augmentin. WBC 16>15>14>12>13K , Afebrile. Denies cough   SBPs soft in the 90s. On midodrine. Mild occasional dizziness w/ standing but resolves quickly.   VAD w/u underway. Prealbumin low at 11.2.      Objective:   Weight Range: 75.8 kg Body mass index is 22.66 kg/m.   Vital Signs:   Temp:  [97.7 F (36.5 C)-98 F (36.7 C)] 97.8 F (36.6 C) (11/02 0729) Pulse Rate:  [82-95] 82 (11/02 0944) Resp:  [19-32] 20 (11/02 0750) BP: (88-113)/(49-77) 93/56 (11/02 0750) SpO2:  [92 %-98 %] 98 % (11/02 0750) Weight:  [75.8 kg] 75.8 kg (11/02 0602) Last BM Date: 03/13/21  Weight change: Filed Weights   03/12/21 0448 03/13/21 0526 03/14/21 0602  Weight: 75 kg 75.7 kg 75.8 kg    Intake/Output:   Intake/Output Summary (Last 24 hours) at 03/14/2021 1028 Last data filed at 03/14/2021 0353 Gross per 24 hour  Intake 250 ml  Output 1100 ml  Net -850 ml      Physical Exam   CVP 4  General:  Well appearing, thin. No respiratory difficulty HEENT: normal Neck: supple. no JVD. Carotids 2+ bilat; no bruits. No lymphadenopathy or thyromegaly appreciated. Cor: PMI nondisplaced. Regular rate & rhythm. No rubs, gallops or murmurs. Lungs: clear Abdomen:  soft, nontender, nondistended. No hepatosplenomegaly. No bruits or masses. Good bowel sounds. Extremities: no cyanosis, clubbing, rash, edema Neuro: alert & oriented x 3, cranial nerves grossly intact. moves all 4 extremities w/o difficulty. Affect pleasant.  Telemetry   NSR 80s  personally checked.  EKG    N/A   Labs    CBC Recent Labs    03/11/21 1038 03/14/21 0400  WBC 12.7* 13.1*  NEUTROABS 10.8*  --   HGB 10.1* 9.8*  HCT 31.8* 30.9*  MCV 78.1* 78.2*  PLT 292 124   Basic Metabolic Panel Recent Labs    03/13/21 0621 03/14/21 0400  NA 132* 130*  K 4.2 4.0  CL 99 99  CO2 25 26  GLUCOSE 104* 96  BUN 14 13  CREATININE 0.58* 0.50*  CALCIUM 8.9 8.8*  MG 2.0 2.0   Liver Function Tests No results for input(s): AST, ALT, ALKPHOS, BILITOT, PROT, ALBUMIN in the last 72 hours.  No results for input(s): LIPASE, AMYLASE in the last 72 hours. Cardiac Enzymes No results for input(s): CKTOTAL, CKMB, CKMBINDEX, TROPONINI in the last 72 hours.  BNP: BNP (last 3 results) Recent Labs    11/30/20 0144 01/08/21 0731 01/18/21 1629  BNP 2,653.1* 734.2* 607.9*    ProBNP (last 3 results) Recent Labs    03/23/20 0922  PROBNP 520*     D-Dimer No  results for input(s): DDIMER in the last 72 hours. Hemoglobin A1C No results for input(s): HGBA1C in the last 72 hours. Fasting Lipid Panel No results for input(s): CHOL, HDL, LDLCALC, TRIG, CHOLHDL, LDLDIRECT in the last 72 hours. Thyroid Function Tests No results for input(s): TSH, T4TOTAL, T3FREE, THYROIDAB in the last 72 hours.  Invalid input(s): FREET3  Other results:   Imaging    No results found.   Medications:     Scheduled Medications:  (feeding supplement) PROSource Plus  30 mL Oral TID BM   amoxicillin-clavulanate  1 tablet Oral Q12H   apixaban  5 mg Oral BID   atorvastatin  80 mg Oral QPM   Chlorhexidine Gluconate Cloth  6 each Topical Daily   digoxin  0.125 mg Oral Daily   diphenhydrAMINE  50  mg Oral QHS   ezetimibe  10 mg Oral Daily   feeding supplement  1 Container Oral TID BM   furosemide  40 mg Oral Daily   gabapentin  200 mg Oral TID   ivabradine  7.5 mg Oral BID WC   lidocaine  1 patch Transdermal Q24H   melatonin  5 mg Oral QHS   midodrine  5 mg Oral TID WC   multivitamin with minerals  1 tablet Oral Daily   pantoprazole  40 mg Oral Daily   potassium chloride  40 mEq Oral Daily   sertraline  50 mg Oral Daily   sodium chloride flush  10-40 mL Intracatheter Q12H   tamsulosin  0.4 mg Oral Daily    Infusions:    PRN Medications: acetaminophen **OR** acetaminophen, guaiFENesin, ondansetron **OR** ondansetron (ZOFRAN) IV, sodium chloride flush    Patient Profile   [name] is a 65 year old with history of smoker, CAD s/p previous MI, HTN, HL, chronic HFeEF, sepsis 11/2020 bacteremia.  Admitted with sepsis. Bld Cx - NGTD    Assessment/Plan   Sepsis - H/o strep bacteremia 11/2020.  ICD extracted 12/04/20. Barostim was not removed as it is extravascular.  - Had been on IV antibiotics until 01/15/21. Blood cultures negative 01/30/21. - WBCs 16 => 14->13  Afebrile.  - Procalcitonin 0.22->0.11 - 10/26 -Blood CX x2  No growth 5 days.  - Ct chest concerning for pneumonia.  ?PNA => initially on cefepime/vancomycin, now off both.   - Started Augmentin 10/31 -ID following.   2. Acute Hypoxic Respiratory Failure  - Prior to admit using oxygen as needed.  - Oxygen requirements reduced to 2 liters.  - CXR with pulmonary edema initially, now has diuresed with IV Lasix and CVP 4  this morning.  -  CT chest completed 10/30 concerning for bronchopneumonia. On augmentin.    3. Chronic Systolic HFr EF - Echo 10/19/65 EF 20-25% RV mildly HK. moderate AS  Mean gradient 13 AVA 1.2 cm2 DI 0.30 - RHC (7/22): EF 20%, low filling pressures. CI 2.3.  - No bb with hypotension.  - Continue ivabradine 7.5 mg twice a day.  - Continue digoxin. Dig level ok (0.3)  - On midodrine,  increase to 10 tid  - Now off milrinone, Co-ox 63>>54% - Follow 1 more day, if co-ox further drops may need home milrinone  - CVP 4 today. Continue lasix 40 mg daily.    - Renal function stable.  - Being considered for possible VAD, however prealbumin is low at 11.2     4. CAD  s/p anterior STEMI (12/20). LHC showed chronically occluded RCA (with L>>R collaterals) and thrombotic occlusion of  proximal LAD. Underwent PCI of LAD. - LHC (7/22) with stable CAD.  - No chest pain.    5. H./O RUE DVT On eliquis.   6. Severe protein-calorie malnutrition - prealbumin 11 - nutrition consult  PT has assessed. Does not need home PT.     Length of Stay: [address], PA-C  03/14/2021, 10:28 AM  Advanced Heart Failure Team Pager [phone] (M-F; 7a - 5p)  Please contact [facility] for night-coverage after hours (5p -7a ) and weekends on [email or web address]  Patient seen and examined with the above-signed Advanced Practice Provider and/or Housestaff. I personally reviewed laboratory data, imaging studies and relevant notes. I independently examined the patient and formulated the important aspects of the plan. I have edited the note to reflect any of my changes or salient points. I have personally discussed the plan with the patient and/or family.  Milrinone stopped yesterday. Co-ox 63% -> 54%  Feels more SOB today. + orthopnea. CVP 3. On augmentin. Afebrile. Prealbumin 11  General:  Weak appearing. No resp difficulty HEENT: normal Neck: supple. no JVD. Carotids 2+ bilat; no bruits. No lymphadenopathy or thryomegaly appreciated. Cor: PMI nondisplaced. Regular rate & rhythm. 2/6 AS Lungs: dull at bases Abdomen: soft, nontender, nondistended. No hepatosplenomegaly. No bruits or masses. Good bowel sounds. Extremities: no cyanosis, clubbing, rash, edema Neuro: alert & orientedx3, cranial nerves grossly intact. moves all 4 extremities w/o difficulty. Affect pleasant  Remains very tenuous. Much  more symptomatic after milrinone stopped despite low CVP. Will restart milrinone.  I discussed his case with [name] again and with recurrent infections, acute worsening of HF and low pre-albumin they do not think he is transplant candidate.   We will restart milrinone. Have nutrition see and work to try and get him to VAD. He may need an extended stay in the hosptial and cosndier VAD this admit.   Glori Bickers, MD  3:47 PM

## 2021-03-14 NOTE — Progress Notes (Signed)
[facility] PROGRESS NOTE    Progress Note  Brandon [name]  OZH:[id number] DOB: Jul 28, 1955 DOA: 03/07/2021 PCP: [name], MD     Brief Narrative:   Brandon [name] is an 65 y.o. male past medical history of coronary artery disease, chronic systolic heart failure with an AICD extracted on 12/01/2020 due to infection completed antibiotics on 01/15/2021 Rocephin, moderate aortic stenosis right upper extremity DVT comes into the ED for acute onset of dyspnea, was found hypoxic placed on 2 L of oxygen found to be in acute decompensated heart failure, cardiology consulted    Significant Events: 10/29 Co-ox 48%, started on milrinone 0.25.  10/30 IV Antibiotics stopped. Blood cultures no growth for 5 days.  10/31 started on augmentin. Milrinone cut back to 0.125 mcg.    Antibiotics: 03/07/2021 IV Vanco and cefepime till 03/10/2021 03/11/2021 Augmentin  Microbiology data: 03/07/2021 blood culture: Remain negative till date  Procedures: None  Assessment/Plan:   Acute on chronic respiratory failure with hypoxia ([place]) possibly due to pneumonia/sepsis: ID was consulted recommended to stop antibiotics there was a concern for interstitial lung disease due to him having ankylosing spondylitis. High-resolution CT scan showed bronchopneumonia. He was started back on Augmentin by ID for total of 7 days.  Due to findings and CT. Physical therapy recommended no home health PT.  Chronic systolic heart failure: Last 2D echo in July 2022 showed an EF of 20%. Heart failure team on board. Currently on ivabradine, digoxin, midodrine 3 times a day. Recommended to watch Co- ox over the next couple of days on midodrine. Will continue Lasix daily as long as renal function remains stable. They will try to wean him off milrinone but there is a question he might need to go home on inotropes.  Dyslipidemia:  continue statin therapy  Right upper extremity DVT with neuropathy: Continue  apixaban and gabapentin.  Depression: Continue sertraline.  BPH: Continue Flomax.  Hypokalemia: Continue to replete and recheck.  Mild hyponatremia: Likely due to heart failure.   Protein-calorie malnutrition, severe Ensure 3 times daily  Stage I sacral decubitus ulcer present on admission stage I RN Pressure Injury Documentation: Pressure Injury 12/01/20 Coccyx Medial Stage 1 -  Intact skin with non-blanchable redness of a localized area usually over a bony prominence. (Active)  12/01/20 0800  Location: Coccyx  Location Orientation: Medial  Staging: Stage 1 -  Intact skin with non-blanchable redness of a localized area usually over a bony prominence.  Wound Description (Comments):   Present on Admission:      Pressure Injury 12/02/20 Vertebral column Posterior;Mid blanchable but red (Active)  12/02/20 2000  Location: Vertebral column  Location Orientation: Posterior;Mid  Staging:   Wound Description (Comments): blanchable but red  Present on Admission: No      DVT prophylaxis: lovenox Family Communication:none Status is: Inpatient  Remains inpatient appropriate because: acute severity of liness    Code Status:     Code Status Orders  (From admission, onward)           Start     Ordered   03/07/21 1641  Full code  Continuous        03/07/21 1642           Code Status History     Date Active Date Inactive Code Status Order ID Comments User Context   01/08/2021 1003 01/09/2021 1426 Full Code [id number]  Conrad Co[place], NP ED   11/29/2020 1709 12/11/2020 2058 Full Code [id number]  Clegg, Amy  D, NP Inpatient   11/29/2020 1107 11/29/2020 1709 Full Code [id number]  Jolaine Artist, MD Inpatient   12/31/2019 1107 12/31/2019 2205 Full Code [id number]  Ortencia Kick Inpatient   08/31/2019 1712 09/01/2019 1518 Full Code [id number]  [name], MD Inpatient   04/27/2019 1927 05/04/2019 1953 Full Code [id number]  [name], MD Inpatient    04/27/2019 1926 04/27/2019 1927 Full Code [id number]  Barrett, Evelene Croon, PA-C Inpatient         IV Access:   Peripheral IV   Procedures and diagnostic studies:   No results found.   Medical Consultants:   None.   Subjective:    Brandon [name] feels better no complaints  Objective:    Vitals:   03/14/21 0353 03/14/21 0602 03/14/21 0729 03/14/21 0750  BP: 103/72  (!) 88/49 (!) 93/56  Pulse: 86  86 87  Resp: 19  (!) 32 20  Temp: 97.7 F (36.5 C)  97.8 F (36.6 C)   TempSrc: Oral  Oral   SpO2: 94%  97% 98%  Weight:  75.8 kg    Height:       SpO2: 98 % O2 Flow Rate (L/min): 2 L/min   Intake/Output Summary (Last 24 hours) at 03/14/2021 0932 Last data filed at 03/14/2021 0353 Gross per 24 hour  Intake 250 ml  Output 1100 ml  Net -850 ml   Filed Weights   03/12/21 0448 03/13/21 0526 03/14/21 0602  Weight: 75 kg 75.7 kg 75.8 kg    Exam: General exam: In no acute distress. Respiratory system: Good air movement and clear to auscultation. Cardiovascular system: S1 & S2 heard, RRR. No JVD. Gastrointestinal system: Abdomen is nondistended, soft and nontender.  Extremities: No pedal edema. Skin: No rashes, lesions or ulcers Psychiatry: Judgement and insight appear normal. Mood & affect appropriate.    Data Reviewed:    Labs: Basic Metabolic Panel: Recent Labs  Lab 03/10/21 0901 03/11/21 1038 03/12/21 0446 03/13/21 0621 03/14/21 0400  NA 129* 129* 128* 132* 130*  K 3.2* 3.3* 3.8 4.2 4.0  CL 95* 93* 97* 99 99  CO2 25 28 27 25 26   GLUCOSE 123* 174* 134* 104* 96  BUN 21 19 16 14 13   CREATININE 0.67 0.60* 0.54* 0.58* 0.50*  CALCIUM 8.7* 8.7* 8.8* 8.9 8.8*  MG  --  1.9 1.9 2.0 2.0   GFR Estimated Creatinine Clearance: 96.1 mL/min (A) (by C-G formula based on SCr of 0.5 mg/dL (L)). Liver Function Tests: Recent Labs  Lab 03/07/21 2058  AST 20  ALT 19  ALKPHOS 92  BILITOT 1.5*  PROT 6.5  ALBUMIN 2.8*   No results for input(s): LIPASE,  AMYLASE in the last 168 hours. No results for input(s): AMMONIA in the last 168 hours. Coagulation profile No results for input(s): INR, PROTIME in the last 168 hours. COVID-19 Labs  No results for input(s): DDIMER, FERRITIN, LDH, CRP in the last 72 hours.  Lab Results  Component Value Date   SARSCOV2NAA NEGATIVE 03/07/2021   SARSCOV2NAA NEGATIVE 01/08/2021   [place] NEGATIVE 12/04/2020   [place] NEGATIVE 11/14/2020    CBC: Recent Labs  Lab 03/08/21 0925 03/09/21 0904 03/10/21 0901 03/11/21 1038 03/14/21 0400  WBC 16.0* 15.9* 14.2* 12.7* 13.1*  NEUTROABS 14.9*  --  12.7* 10.8*  --   HGB 10.1* 10.4* 10.6* 10.1* 9.8*  HCT 32.2* 33.4* 33.7* 31.8* 30.9*  MCV 79.7* 80.3 78.9* 78.1* 78.2*  PLT 297 287 333 292 347  Cardiac Enzymes: No results for input(s): CKTOTAL, CKMB, CKMBINDEX, TROPONINI in the last 168 hours. BNP (last 3 results) Recent Labs    03/23/20 0922  PROBNP 520*   CBG: No results for input(s): GLUCAP in the last 168 hours. D-Dimer: No results for input(s): DDIMER in the last 72 hours. Hgb A1c: No results for input(s): HGBA1C in the last 72 hours. Lipid Profile: No results for input(s): CHOL, HDL, LDLCALC, TRIG, CHOLHDL, LDLDIRECT in the last 72 hours. Thyroid function studies: No results for input(s): TSH, T4TOTAL, T3FREE, THYROIDAB in the last 72 hours.  Invalid input(s): FREET3 Anemia work up: No results for input(s): VITAMINB12, FOLATE, FERRITIN, TIBC, IRON, RETICCTPCT in the last 72 hours. Sepsis Labs: Recent Labs  Lab 03/07/21 1254 03/07/21 1441 03/07/21 2058 03/08/21 0106 03/08/21 0925 03/09/21 0414 03/09/21 0904 03/10/21 0901 03/11/21 1038 03/14/21 0400  PROCALCITON  --   --  0.16 0.22  --  0.11  --   --   --   --   WBC  --   --   --   --    < >  --  15.9* 14.2* 12.7* 13.1*  LATICACIDVEN 1.2 1.1  --   --   --   --   --   --   --   --    < > = values in this interval not displayed.   Microbiology Recent Results (from the  past 240 hour(s))  Resp Panel by RT-PCR (Flu A&B, Covid) Nasopharyngeal Swab     Status: None   Collection Time: 03/07/21 12:00 PM   Specimen: Nasopharyngeal Swab; Nasopharyngeal(NP) swabs in vial transport medium  Result Value Ref Range Status   SARS Coronavirus 2 by RT PCR NEGATIVE NEGATIVE Final    Comment: (NOTE) SARS-CoV-2 target nucleic acids are NOT DETECTED.  The SARS-CoV-2 RNA is generally detectable in upper respiratory specimens during the acute phase of infection. The lowest concentration of SARS-CoV-2 viral copies this assay can detect is 138 copies/mL. A negative result does not preclude SARS-Cov-2 infection and should not be used as the sole basis for treatment or other patient management decisions. A negative result may occur with  improper specimen collection/handling, submission of specimen other than nasopharyngeal swab, presence of viral mutation(s) within the areas targeted by this assay, and inadequate number of viral copies(<138 copies/mL). A negative result must be combined with clinical observations, patient history, and epidemiological information. The expected result is Negative.  Fact Sheet for Patients:  EntrepreneurPulse.com.au  Fact Sheet for Healthcare Providers:  IncredibleEmployment.be  This test is no t yet approved or cleared by the [place] FDA and  has been authorized for detection and/or diagnosis of SARS-CoV-2 by FDA under an Emergency Use Authorization (EUA). This EUA will remain  in effect (meaning this test can be used) for the duration of the COVID-19 declaration under Section 564(b)(1) of the Act, 21 U.S.C.section 360bbb-3(b)(1), unless the authorization is terminated  or revoked sooner.       Influenza A by PCR NEGATIVE NEGATIVE Final   Influenza B by PCR NEGATIVE NEGATIVE Final    Comment: (NOTE) The Xpert Xpress SARS-CoV-2/FLU/RSV plus assay is intended as an aid in the diagnosis of  influenza from Nasopharyngeal swab specimens and should not be used as a sole basis for treatment. Nasal washings and aspirates are unacceptable for Xpert Xpress SARS-CoV-2/FLU/RSV testing.  Fact Sheet for Patients: EntrepreneurPulse.com.au  Fact Sheet for Healthcare Providers: IncredibleEmployment.be  This test is not yet approved or cleared by the  [place] Architectural technologist and has been authorized for detection and/or diagnosis of SARS-CoV-2 by FDA under an Print production planner (EUA). This EUA will remain in effect (meaning this test can be used) for the duration of the COVID-19 declaration under Section 564(b)(1) of the Act, 21 U.S.C. section 360bbb-3(b)(1), unless the authorization is terminated or revoked.  Performed at [facility], [place] [address], [place], [place] [address]   Respiratory (~20 pathogens) panel by PCR     Status: None   Collection Time: 03/07/21 12:00 PM   Specimen: Nasopharyngeal Swab; Respiratory  Result Value Ref Range Status   Adenovirus NOT DETECTED NOT DETECTED Final   Coronavirus 229E NOT DETECTED NOT DETECTED Final    Comment: (NOTE) The Coronavirus on the Respiratory Panel, DOES NOT test for the novel  Coronavirus (2019 nCoV)    Coronavirus HKU1 NOT DETECTED NOT DETECTED Final   Coronavirus NL63 NOT DETECTED NOT DETECTED Final   Coronavirus OC43 NOT DETECTED NOT DETECTED Final   Metapneumovirus NOT DETECTED NOT DETECTED Final   Rhinovirus / Enterovirus NOT DETECTED NOT DETECTED Final   Influenza A NOT DETECTED NOT DETECTED Final   Influenza B NOT DETECTED NOT DETECTED Final   Parainfluenza Virus 1 NOT DETECTED NOT DETECTED Final   Parainfluenza Virus 2 NOT DETECTED NOT DETECTED Final   Parainfluenza Virus 3 NOT DETECTED NOT DETECTED Final   Parainfluenza Virus 4 NOT DETECTED NOT DETECTED Final   Respiratory Syncytial Virus NOT DETECTED NOT DETECTED Final   Bordetella pertussis NOT DETECTED NOT DETECTED  Final   Bordetella Parapertussis NOT DETECTED NOT DETECTED Final   Chlamydophila pneumoniae NOT DETECTED NOT DETECTED Final   Mycoplasma pneumoniae NOT DETECTED NOT DETECTED Final    Comment: Performed at [facility], [place]. [address], [place], [place] [address]  Blood culture (routine x 2)     Status: None   Collection Time: 03/07/21 12:54 PM   Specimen: BLOOD LEFT WRIST  Result Value Ref Range Status   Specimen Description BLOOD LEFT WRIST  Final   Special Requests   Final    BOTTLES DRAWN AEROBIC AND ANAEROBIC Blood Culture adequate volume   Culture   Final    NO GROWTH 5 DAYS Performed at [facility], [place] [address], [place], [place] [address]    Report Status 03/12/2021 FINAL  Final  Blood culture (routine x 2)     Status: None   Collection Time: 03/07/21  4:41 PM   Specimen: Site Not Specified; Blood  Result Value Ref Range Status   Specimen Description SITE NOT SPECIFIED  Final   Special Requests   Final    BOTTLES DRAWN AEROBIC AND ANAEROBIC Blood Culture results may not be optimal due to an inadequate volume of blood received in culture bottles   Culture   Final    NO GROWTH 5 DAYS Performed at [facility], [place] [address], [place], [place] [address]    Report Status 03/12/2021 FINAL  Final  [name] Next Gen by PCR, Nasal     Status: None   Collection Time: 03/07/21 10:15 PM   Specimen: Nasal Mucosa; Nasal Swab  Result Value Ref Range Status   [name] by PCR Next Gen NOT DETECTED NOT DETECTED Final    Comment: (NOTE) The GeneXpert [name] Assay (FDA approved for NASAL specimens only), is one component of a comprehensive [name] colonization surveillance program. It is not intended to diagnose [name] infection nor to guide or monitor treatment for [name]  infections. Test performance is not FDA approved in patients less than 41 years old. Performed at [facility], [place] [address] [place], [place] [address]   Urine Culture     Status: None   Collection Time:  03/09/21  5:25 PM   Specimen: Urine, Clean Catch  Result Value Ref Range Status   Specimen Description URINE, CLEAN CATCH  Final   Special Requests NONE  Final   Culture   Final    NO GROWTH Performed at [facility], [place] [address] [place], [place] [address]    Report Status 03/11/2021 FINAL  Final     Medications:    (feeding supplement) PROSource Plus  30 mL Oral TID BM   amoxicillin-clavulanate  1 tablet Oral Q12H   apixaban  5 mg Oral BID   atorvastatin  80 mg Oral QPM   Chlorhexidine Gluconate Cloth  6 each Topical Daily   digoxin  0.125 mg Oral Daily   diphenhydrAMINE  50 mg Oral QHS   ezetimibe  10 mg Oral Daily   feeding supplement  1 Container Oral TID BM   furosemide  40 mg Oral Daily   gabapentin  200 mg Oral TID   ivabradine  7.5 mg Oral BID WC   lidocaine  1 patch Transdermal Q24H   melatonin  5 mg Oral QHS   midodrine  5 mg Oral TID WC   multivitamin with minerals  1 tablet Oral Daily   pantoprazole  40 mg Oral Daily   potassium chloride  40 mEq Oral Daily   sertraline  50 mg Oral Daily   sodium chloride flush  10-40 mL Intracatheter Q12H   tamsulosin  0.4 mg Oral Daily   Continuous Infusions:    LOS: 6 days   Charlynne Cousins  [facility]  03/14/2021, 9:32 AM

## 2021-03-14 NOTE — Plan of Care (Signed)

## 2021-03-15 DIAGNOSIS — J9621 Acute and chronic respiratory failure with hypoxia: Secondary | ICD-10-CM | POA: Diagnosis not present

## 2021-03-15 LAB — BASIC METABOLIC PANEL
Anion gap: 5 (ref 5–15)
BUN: 13 mg/dL (ref 8–23)
CO2: 27 mmol/L (ref 22–32)
Calcium: 8.8 mg/dL — ABNORMAL LOW (ref 8.9–10.3)
Chloride: 101 mmol/L (ref 98–111)
Creatinine, Ser: 0.58 mg/dL — ABNORMAL LOW (ref 0.61–1.24)
GFR, Estimated: >60 mL/min (ref >60)
Glucose, Bld: 112 mg/dL — ABNORMAL HIGH (ref 70–99)
Potassium: 3.8 mmol/L (ref 3.5–5.1)
Sodium: 133 mmol/L — ABNORMAL LOW (ref 135–145)

## 2021-03-15 LAB — CBC
HCT: 29.1 % — ABNORMAL LOW (ref 39.0–52.0)
Hemoglobin: 9.1 g/dL — ABNORMAL LOW (ref 13.0–17.0)
MCH: 24.5 pg — ABNORMAL LOW (ref 26.0–34.0)
MCHC: 31.3 g/dL (ref 30.0–36.0)
MCV: 78.4 fL — ABNORMAL LOW (ref 80.0–100.0)
Platelets: 323 10*3/uL (ref 150–400)
RBC: 3.71 MIL/uL — ABNORMAL LOW (ref 4.22–5.81)
RDW: 17.4 % — ABNORMAL HIGH (ref 11.5–15.5)
WBC: 12 10*3/uL — ABNORMAL HIGH (ref 4.0–10.5)
nRBC: 0 % (ref 0.0–0.2)

## 2021-03-15 LAB — COOXEMETRY PANEL
Carboxyhemoglobin: 1.2 % (ref 0.5–1.5)
Methemoglobin: 0.6 % (ref 0.0–1.5)
O2 Saturation: 60.8 %
Total hemoglobin: 13.8 g/dL (ref 12.0–16.0)

## 2021-03-15 LAB — MAGNESIUM: Magnesium: 2 mg/dL (ref 1.7–2.4)

## 2021-03-15 MED ORDER — POTASSIUM CHLORIDE CRYS ER 20 MEQ PO TBCR
40.0000 meq | EXTENDED_RELEASE_TABLET | Freq: Once | ORAL | Status: DC
  Filled 2021-03-15: qty 2

## 2021-03-15 MED ORDER — ENSURE ENLIVE PO LIQD
237.0000 mL | Freq: Two times a day (BID) | ORAL | Status: DC
  Administered 2021-03-15 – 2021-03-25 (×19): 237 mL via ORAL

## 2021-03-15 NOTE — Progress Notes (Signed)
Physical Therapy Treatment & Discharge Patient Details Name: Brandon [name] MRN: [id number] DOB: 08/31/1955 Today's Date: 03/15/2021   History of Present Illness Pt is a 65 y.o. male admitted 03/07/21 with acute on chronic hypoxemic respiratory failure, cardiogenic shock. CT scan which shows bronchopneumonia and no indication of ILD. Pt undergoing workup for VAD. Other PMH includes CHF, ankylosing spondylitis, bacteremia and  possible endocarditis s/p ICD removal, HTN, depression, arthritis.   PT Comments    Pt progressing with mobility. Session focused on activity for improving strength and endurance, incorporating sternal precautions in preparation for potential LVAD; pt indep with mobility while maintaining precautions. Reviewed educ re: activity recommendations, importance of continued activity with nursing staff and mobility specialists. Pt with no further acute PT needs; please reconsult as appropriate post-op.    Recommendations for follow up therapy are one component of a multi-disciplinary discharge planning process, led by the attending physician.  Recommendations may be updated based on patient status, additional functional criteria and insurance authorization.  Follow Up Recommendations  No PT follow up     Assistance Recommended at Discharge PRN  Equipment Recommendations  None recommended by PT    Recommendations for Other Services       Precautions / Restrictions Precautions Precautions: Fall;Other (comment) Precaution Comments: Initiated educ on sternal precautions in preparation for potential VAD placement Restrictions Weight Bearing Restrictions: No     Mobility  Bed Mobility Overal bed mobility: Independent             General bed mobility comments: Indep while maintaining "sternal precautions"; educ on log roll technqiue    Transfers Overall transfer level: Independent Equipment used: None               General transfer comment: Able to perform  repeated sit<>stands without UE support; educ on technique to maintain sternal precautions if UE support needed (hands on or besides knees)    Ambulation/Gait Ambulation/Gait assistance: Independent Gait Distance (Feet): 750 Feet Assistive device: None;IV Pole Gait Pattern/deviations: Step-through pattern;Decreased stride length;Trunk flexed Gait velocity: Decreased   General Gait Details: Slow, steady gait indep with and without pushing IV pole; noted improvements in DOE 2/4, pt with good activity pacing and performing pursed lip breathing without cues; ambulated on 2L O2 [place], HR up to 104   Stairs             Wheelchair Mobility    Modified Rankin (Stroke Patients Only)       Balance Overall balance assessment: No apparent balance deficits (not formally assessed)   Sitting balance-Leahy Scale: Good       Standing balance-Leahy Scale: Good               High level balance activites: Side stepping;Direction changes;Turns;Sudden stops;Head turns High Level Balance Comments: no overt instability or LOB observed with higher level balance tasks            Cognition Arousal/Alertness: Awake/alert Behavior During Therapy: WFL for tasks assessed/performed Overall Cognitive Status: Within Functional Limits for tasks assessed                                          Exercises Other Exercises Other Exercises: 5x repeated sit<>stand (did not require UE support, does report back discomfort); educ on standing calf raises, standing partial squats (with UE support on table/counter)    General Comments General comments (skin integrity,   edema, etc.): SpO2 98% on 2L O2 with seated rest at end of session; HR 91-104      Pertinent Vitals/Pain Pain Assessment: Faces Faces Pain Scale: Hurts a little bit Pain Location: RUE, back Pain Descriptors / Indicators: Sore;Discomfort Pain Intervention(s): Monitored during session    Home Living                           Prior Function            PT Goals (current goals can now be found in the care plan section) Progress towards PT goals: Goals met/education completed, patient discharged from PT    Frequency    Min 2X/week      PT Plan Current plan remains appropriate    Co-evaluation              AM-PAC PT "6 Clicks" Mobility   Outcome Measure  Help needed turning from your back to your side while in a flat bed without using bedrails?: None Help needed moving from lying on your back to sitting on the side of a flat bed without using bedrails?: None Help needed moving to and from a bed to a chair (including a wheelchair)?: None Help needed standing up from a chair using your arms (e.g., wheelchair or bedside chair)?: None Help needed to walk in hospital room?: None Help needed climbing 3-5 steps with a railing? : None 6 Click Score: 24    End of Session Equipment Utilized During Treatment: Oxygen Activity Tolerance: Patient tolerated treatment well Patient left: in bed;with call bell/phone within reach Nurse Communication: Mobility status PT Visit Diagnosis: Other abnormalities of gait and mobility (R26.89)     Time: 1426-1450 PT Time Calculation (min) (ACUTE ONLY): 24 min  Charges:  $Therapeutic Exercise: 8-22 mins $Therapeutic Activity: 8-22 mins                     Mabeline Caras, PT, DPT Acute Rehabilitation Services  Pager [phone] Office [place] 03/15/2021, 3:06 PM

## 2021-03-15 NOTE — Progress Notes (Signed)
Nutrition Follow-up  DOCUMENTATION CODES:   Severe malnutrition in context of chronic illness  INTERVENTION:   - Initiate 72-hour calorie count today at 1200 (lunch meal)  - Ensure Enlive po BID, each supplement provides 350 kcal and 20 grams of protein  - ProSource Plus 30 ml po TID, each supplement provides 100 kcal and 15 grams of protein  - Continue MVI with minerals daily  - Encourage PO intake  NUTRITION DIAGNOSIS:   Severe Malnutrition related to chronic illness (CHF) as evidenced by severe muscle depletion, severe fat depletion.  Ongoing, being addressed via oral nutrition supplements  GOAL:   Patient will meet greater than or equal to 90% of their needs  Progressing  MONITOR:   PO intake, Supplement acceptance, Weight trends, Labs, I & O's  REASON FOR ASSESSMENT:   Consult LVAD Eval  ASSESSMENT:   65 y.o. male presented to the ED with sudden onset of dyspnea. PMH includes CAD, CHF with ICD, HTN, and on 2L [place] at baseline. Pt admitted with respiratory failure with hypoxia with SIRS.  Per notes, pt not a candidate for transplant and is still being worked up for possible LVAD.  Discussed potential for Cortrak placement and initiation of nocturnal TF to improve nutritional status with Dr. [name] who as in agreement. Also discussed this option with pt as well as other options of optimizing nutrition prior to potential surgery. Pt would like to hold off on Cortrak tube placement at this time and try to optimize nutrition via consistent PO intake at meals and consumption of oral nutrition supplements like Ensure and ProSource. Dr. [name] made aware. RD to order 72-hour calorie count to assess for adequate PO intake. Pt willing to consume 2 Ensure daily and 3 ProSource daily to start. Pt reports that he will continue to consume 100% of meals.  Checked back in with pt after lunch. He had consumed 100% of lunch meal (418 kcal, 15 grams of protein) along with 1  Ensure Enlive (350 kcal, 20 grams of protein) and 1 ProSource Plus (100 kcal, 15 grams of protein).  Admit weight: 76.9 kg Current weight: 75.5 kg  Pt with 1.4 kg weight loss since admission but is net negative 4.9 L since admit. Pt continues to diurese with lasix.  Meal Completion: 75-100% x last 8 documented meals  Medications reviewed and include: ProSource Plus 30 ml TID, Boost Breeze TID, lasix, melatonin, MVI with minerals, protonix, klor-con 40 mEq daily, milrinone drip  Labs reviewed: sodium 133, hemoglobin 9.1  UOP: 1000 ml x 24 hours I/O's: -4.9 L since admit  Diet Order:   Diet Order             Diet Heart Room service appropriate? Yes; Fluid consistency: Thin  Diet effective now                   EDUCATION NEEDS:   No education needs have been identified at this time  Skin:  Skin Assessment: Reviewed RN Assessment  Last BM:  03/13/21  Height:   Ht Readings from Last 1 Encounters:  03/07/21 6' (1.829 m)    Weight:   Wt Readings from Last 1 Encounters:  03/15/21 75.5 kg    Ideal Body Weight:  80.9 kg  BMI:  Body mass index is 22.57 kg/m.  Estimated Nutritional Needs:   Kcal:  2300-2500  Protein:  115-130 grams  Fluid:  >/= 2 L    Gustavus Bryant, MS, RD, LDN Inpatient Clinical Dietitian Please see  [facility] for contact information.

## 2021-03-15 NOTE — Progress Notes (Addendum)
Patient ID: Brandon [name], male   DOB: 06-02-1955, 65 y.o.   MRN: [id number]     Advanced Heart Failure Rounding Note  PCP-Cardiologist: [name], MD   Subjective:    Admitted with sepsis. Started on cefepime + vanc, now off abx (ID following). Afebrile.  10/27 started on ivabradine 7.5 mg twice a day and midodrine 5 mg TID 10/29 Co-ox 48%, started on milrinone 0.25.  10/30 IV Antibiotics stopped. Blood cultures no growth for 5 days.  10/31 started on Augmentin for PNA.  11/1 Milrinone off 11/2 Milrinone added back. Midodrine increased to 10 mg TID  Felt poorly after milrinone stopped. Co-ox dropped to 54%. Milrinone restarted at 0.125.  He feels much better after milrinone restarted. Slept well. No dyspnea at rest.  Co-ox up to 61%  CVP 3. On 40 mg lasix po daily  On Augmentin. WBC 16>15>14>12>13>12K.   SBP slightly improved, now low 100s after midodrine increased yesterday  VAD w/u underway. Prealbumin low at 11.2.     Objective:   Weight Range: 75.5 kg Body mass index is 22.57 kg/m.   Vital Signs:   Temp:  [98.1 F (36.7 C)-98.7 F (37.1 C)] 98.6 F (37 C) (11/03 0724) Pulse Rate:  [79-99] 84 (11/03 0724) Resp:  [17-25] 17 (11/03 0724) BP: (97-106)/(44-70) 100/67 (11/03 0724) SpO2:  [90 %-98 %] 97 % (11/03 0724) Weight:  [75.5 kg] 75.5 kg (11/03 0500) Last BM Date: 03/13/21  Weight change: Filed Weights   03/13/21 0526 03/14/21 0602 03/15/21 0500  Weight: 75.7 kg 75.8 kg 75.5 kg    Intake/Output:   Intake/Output Summary (Last 24 hours) at 03/15/2021 0947 Last data filed at 03/15/2021 0700 Gross per 24 hour  Intake 851.91 ml  Output 1000 ml  Net -148.09 ml      Physical Exam   CVP 3 General:  Thin male, no distress. Lying in bed. HEENT: normal Neck: supple. no JVD. Carotids 2+ bilat; no bruits. No lymphadenopathy or thryomegaly appreciated. Cor: PMI nondisplaced. Regular rate & rhythm. No rubs, gallops, 2/6 SEM RUSB Lungs: clear Abdomen:  soft, nontender, nondistended. No hepatosplenomegaly. No bruits or masses. Good bowel sounds. Extremities: no cyanosis, clubbing, rash, edema, + LUE PICC Neuro: alert & orientedx3, cranial nerves grossly intact. moves all 4 extremities w/o difficulty. Affect pleasant   Telemetry   NSR 80s, rare PVCs (personally reviewed)  EKG    N/A   Labs    CBC Recent Labs    03/14/21 0400 03/15/21 0415  WBC 13.1* 12.0*  HGB 9.8* 9.1*  HCT 30.9* 29.1*  MCV 78.2* 78.4*  PLT 347 782   Basic Metabolic Panel Recent Labs    03/14/21 0400 03/15/21 0415  NA 130* 133*  K 4.0 3.8  CL 99 101  CO2 26 27  GLUCOSE 96 112*  BUN 13 13  CREATININE 0.50* 0.58*  CALCIUM 8.8* 8.8*  MG 2.0 2.0   Liver Function Tests No results for input(s): AST, ALT, ALKPHOS, BILITOT, PROT, ALBUMIN in the last 72 hours.  No results for input(s): LIPASE, AMYLASE in the last 72 hours. Cardiac Enzymes No results for input(s): CKTOTAL, CKMB, CKMBINDEX, TROPONINI in the last 72 hours.  BNP: BNP (last 3 results) Recent Labs    11/30/20 0144 01/08/21 0731 01/18/21 1629  BNP 2,653.1* 734.2* 607.9*    ProBNP (last 3 results) Recent Labs    03/23/20 0922  PROBNP 520*     D-Dimer No results for input(s): DDIMER in the last 72 hours.  Hemoglobin A1C No results for input(s): HGBA1C in the last 72 hours. Fasting Lipid Panel No results for input(s): CHOL, HDL, LDLCALC, TRIG, CHOLHDL, LDLDIRECT in the last 72 hours. Thyroid Function Tests No results for input(s): TSH, T4TOTAL, T3FREE, THYROIDAB in the last 72 hours.  Invalid input(s): FREET3  Other results:   Imaging    No results found.   Medications:     Scheduled Medications:  (feeding supplement) PROSource Plus  30 mL Oral TID BM   amoxicillin-clavulanate  1 tablet Oral Q12H   apixaban  5 mg Oral BID   atorvastatin  80 mg Oral QPM   Chlorhexidine Gluconate Cloth  6 each Topical Daily   digoxin  0.125 mg Oral Daily   diphenhydrAMINE  50  mg Oral QHS   ezetimibe  10 mg Oral Daily   feeding supplement  1 Container Oral TID BM   furosemide  40 mg Oral Daily   gabapentin  200 mg Oral TID   ivabradine  7.5 mg Oral BID WC   lidocaine  1 patch Transdermal Q24H   melatonin  5 mg Oral QHS   midodrine  10 mg Oral TID WC   multivitamin with minerals  1 tablet Oral Daily   pantoprazole  40 mg Oral Daily   potassium chloride  40 mEq Oral Daily   sertraline  50 mg Oral Daily   sodium chloride flush  10-40 mL Intracatheter Q12H   tamsulosin  0.4 mg Oral Daily    Infusions:  milrinone 0.125 mcg/kg/min (03/15/21 0357)     PRN Medications: acetaminophen **OR** acetaminophen, guaiFENesin, ondansetron **OR** ondansetron (ZOFRAN) IV, sodium chloride flush    Patient Profile   Brandon [name] is a 65 year old with history of smoker, CAD s/p previous MI, HTN, HL, chronic HFeEF, sepsis 11/2020 bacteremia.  Admitted with sepsis. Bld Cx - NGTD    Assessment/Plan   Sepsis - H/o strep bacteremia 11/2020.  ICD extracted 12/04/20. Barostim was not removed as it is extravascular.  - Had been on IV antibiotics until 01/15/21. Blood cultures negative 01/30/21. - WBCs 16 => 14->13->12. Afebrile.  - Procalcitonin 0.22->0.11 - 10/26 -Blood CX x2  No growth 5 days.  - Ct chest concerning for pneumonia.  ?PNA => initially on cefepime/vancomycin, now off both.   - Started Augmentin 10/31. ID has seen.  2. Acute Hypoxic Respiratory Failure  - Prior to admit using oxygen as needed.  - Oxygen requirements reduced to 2 liters. O2 sats stable. - CXR with pulmonary edema initially, now has diuresed with IV Lasix. Now on po furosemide and CVP 3  this morning.  -  CT chest completed 10/30 concerning for bronchopneumonia. On augmentin.    3. Chronic Systolic HFr EF - Echo 05/16/46 EF 20-25% RV mildly HK. moderate AS  Mean gradient 13 AVA 1.2 cm2 DI 0.30 - RHC (7/22): EF 20%, low filling pressures. CI 2.3.  - Added back 0.125 milrinone yesterday.Co-ox  improved to 61%.  - CVP 3. Continue lasix 40 mg daily.  - No bb with low output HF - Continue ivabradine 7.5 mg twice a day.  - Continue digoxin. Dig level ok (0.3)  - Continue midodrine 10 mg TID   - Discussed his case with [name] again and with recurrent infections, acute worsening of HF and low pre-albumin they do not think he is transplant candidate. Having nutrition see and work to try and get him to VAD. Prealbumin is 11. He may need an extended stay in the  hosptial and consider VAD this admit.    4. CAD  s/p anterior STEMI (12/20). LHC showed chronically occluded RCA (with L>>R collaterals) and thrombotic occlusion of proximal LAD. Underwent PCI of LAD. - LHC (7/22) with stable CAD.  - No chest pain.  - Continue atorvastatin 80. No aspirin d/t anticoagulation.   5. H./O RUE DVT On eliquis.   6. Severe protein-calorie malnutrition - prealbumin 11 - nutrition consult  7. Anemia - 11 > 10.1 > 9.8 > 9.1 - MCV normal - Monitor with anticoagulation - No obvious bleeding    PT has assessed. Does not need home PT.       Length of Stay: Hubbell, [place], PA-C  03/15/2021, 9:47 AM  Advanced Heart Failure Team Pager [phone] (M-F; 7a - 5p)  Please contact [facility] for night-coverage after hours (5p -7a ) and weekends on [email or web address]  Patient seen and examined with the above-signed Advanced Practice Provider and/or Housestaff. I personally reviewed laboratory data, imaging studies and relevant notes. I independently examined the patient and formulated the important aspects of the plan. I have edited the note to reflect any of my changes or salient points. I have personally discussed the plan with the patient and/or family.  Milrinone restarted yesterday. Feels much better. Orthopnea resolved. Able to sleep well. Denies CP. No fevers.   General:  Well appearing. No resp difficulty HEENT: normal Neck: supple. no JVD. Carotids 2+ bilat; no bruits. No lymphadenopathy or  thryomegaly appreciated. Cor: PMI nondisplaced. Regular rate & rhythm. 2/6 AS Lungs: decreased at bases Abdomen: soft, nontender, nondistended. No hepatosplenomegaly. No bruits or masses. Good bowel sounds. Extremities: no cyanosis, clubbing, rash, edema Neuro: alert & orientedx3, cranial nerves grossly intact. moves all 4 extremities w/o difficulty. Affect pleasant  He remains very tenuous. Continue milrinone. Has been deemed not to be transplant candidate. I think we will need to try to work toward VAD this admission. Have d/w nutrition regarding improvement of nutritional status.   Glori Bickers, MD  10:54 AM

## 2021-03-15 NOTE — Progress Notes (Signed)
[facility] PROGRESS NOTE    Progress Note  Brandon [name]  WER:[id number] DOB: [birth year] DOA: 03/07/2021 PCP: [name], MD     Brief Narrative:   Brandon [name] is an 65 y.o. male past medical history of coronary artery disease, chronic systolic heart failure with an AICD extracted on 12/01/2020 due to infection completed antibiotics on 01/15/2021 Rocephin, moderate aortic stenosis right upper extremity DVT comes into the ED for acute onset of dyspnea, was found hypoxic placed on 2 L of oxygen found to be in acute decompensated heart failure, cardiology consulted    Significant Events: 10/29 Co-ox 48%, started on milrinone 0.25.  10/30 IV Antibiotics stopped. Blood cultures no growth for 5 days.  10/31 started on augmentin. Milrinone cut back to 0.125 mcg.  03/12/2021 started on midodrine post was persistently symptomatic. 03/24/2021 started back on milrinone   Antibiotics: 03/07/2021 IV Vanco and cefepime till 03/10/2021 03/11/2021 Augmentin  Microbiology data: 03/07/2021 blood culture: Remain negative till date  Procedures: None  Assessment/Plan:   Acute on chronic respiratory failure with hypoxia ([place]) possibly due to pneumonia/sepsis: High-resolution CT scan showed bronchopneumonia, no LDL. Started on Augmentin by ID for total of 7 days.  Due to findings and CT. Physical therapy recommended no home health PT.  Chronic systolic heart failure: Last 2D echo in July 2022 showed an EF of 20%. Advanced heart failure team on board. Currently on ivabradine, digoxin, midodrine 3 times a day. He remains symptomatic with improve CO-oX. They relate he may remains very tenuous, midodrine was stopped started back on milrinone, not a candidate for transplant will need an extended hospital stay and consider LVAD on this admit.  Dyslipidemia:  continue statin therapy  Right upper extremity DVT with neuropathy: Continue apixaban and  gabapentin.  Depression: Continue sertraline.  BPH: Continue Flomax.  Hypokalemia: Continue to replete and recheck.  Mild hyponatremia: Likely due to heart failure.   Protein-calorie malnutrition, severe Ensure 3 times daily  Stage I sacral decubitus ulcer present on admission stage I RN Pressure Injury Documentation: Pressure Injury 12/01/20 Coccyx Medial Stage 1 -  Intact skin with non-blanchable redness of a localized area usually over a bony prominence. (Active)  12/01/20 0800  Location: Coccyx  Location Orientation: Medial  Staging: Stage 1 -  Intact skin with non-blanchable redness of a localized area usually over a bony prominence.  Wound Description (Comments):   Present on Admission:      Pressure Injury 12/02/20 Vertebral column Posterior;Mid blanchable but red (Active)  12/02/20 2000  Location: Vertebral column  Location Orientation: Posterior;Mid  Staging:   Wound Description (Comments): blanchable but red  Present on Admission: No      DVT prophylaxis: lovenox Family Communication:none Status is: Inpatient  Remains inpatient appropriate because: acute severity of liness    Code Status:     Code Status Orders  (From admission, onward)           Start     Ordered   03/07/21 1641  Full code  Continuous        03/07/21 1642           Code Status History     Date Active Date Inactive Code Status Order ID Comments User Context   01/08/2021 1003 01/09/2021 1426 Full Code [id number]  Conrad [place], NP ED   11/29/2020 1709 12/11/2020 2058 Full Code [id number]  Conrad [place], NP Inpatient   11/29/2020 1107 11/29/2020 1709 Full Code [id number]  Bensimhon, Shaune Pascal,  MD Inpatient   12/31/2019 1107 12/31/2019 2205 Full Code [id number]  Ortencia Kick Inpatient   08/31/2019 1712 09/01/2019 1518 Full Code [id number]  [name], MD Inpatient   04/27/2019 1927 05/04/2019 1953 Full Code [id number]  [name], MD Inpatient   04/27/2019 1926  04/27/2019 1927 Full Code [id number]  Barrett, Evelene Croon, PA-C Inpatient         IV Access:   Peripheral IV   Procedures and diagnostic studies:   No results found.   Medical Consultants:   None.   Subjective:    Brandon [name] relates he slept better today no more orthopnea  Objective:    Vitals:   03/14/21 2344 03/15/21 0357 03/15/21 0500 03/15/21 0724  BP: (!) 102/44 102/61  100/67  Pulse: 80 84  84  Resp: 20 17  17   Temp: 98.3 F (36.8 C) 98.4 F (36.9 C)  98.6 F (37 C)  TempSrc: Oral Oral  Oral  SpO2: 98% 97%  97%  Weight:   75.5 kg   Height:       SpO2: 97 % O2 Flow Rate (L/min): 2 L/min   Intake/Output Summary (Last 24 hours) at 03/15/2021 0848 Last data filed at 03/15/2021 0700 Gross per 24 hour  Intake 1214.91 ml  Output 1000 ml  Net 214.91 ml    Filed Weights   03/13/21 0526 03/14/21 0602 03/15/21 0500  Weight: 75.7 kg 75.8 kg 75.5 kg    Exam: General exam: In no acute distress. Respiratory system: Good air movement and clear to auscultation. Cardiovascular system: S1 & S2 heard, RRR. No JVD. Gastrointestinal system: Abdomen is nondistended, soft and nontender.  Extremities: No pedal edema. Psychiatry: Judgement and insight appear intact.   Data Reviewed:    Labs: Basic Metabolic Panel: Recent Labs  Lab 03/11/21 1038 03/12/21 0446 03/13/21 0621 03/14/21 0400 03/15/21 0415  NA 129* 128* 132* 130* 133*  K 3.3* 3.8 4.2 4.0 3.8  CL 93* 97* 99 99 101  CO2 28 27 25 26 27   GLUCOSE 174* 134* 104* 96 112*  BUN 19 16 14 13 13   CREATININE 0.60* 0.54* 0.58* 0.50* 0.58*  CALCIUM 8.7* 8.8* 8.9 8.8* 8.8*  MG 1.9 1.9 2.0 2.0 2.0    GFR Estimated Creatinine Clearance: 95.7 mL/min (A) (by C-G formula based on SCr of 0.58 mg/dL (L)). Liver Function Tests: No results for input(s): AST, ALT, ALKPHOS, BILITOT, PROT, ALBUMIN in the last 168 hours.  No results for input(s): LIPASE, AMYLASE in the last 168 hours. No results for  input(s): AMMONIA in the last 168 hours. Coagulation profile No results for input(s): INR, PROTIME in the last 168 hours. COVID-19 Labs  No results for input(s): DDIMER, FERRITIN, LDH, CRP in the last 72 hours.  Lab Results  Component Value Date   SARSCOV2NAA NEGATIVE 03/07/2021   [place] NEGATIVE 01/08/2021   [place] NEGATIVE 12/04/2020   [place] NEGATIVE 11/14/2020    CBC: Recent Labs  Lab 03/08/21 0925 03/09/21 0904 03/10/21 0901 03/11/21 1038 03/14/21 0400 03/15/21 0415  WBC 16.0* 15.9* 14.2* 12.7* 13.1* 12.0*  NEUTROABS 14.9*  --  12.7* 10.8*  --   --   HGB 10.1* 10.4* 10.6* 10.1* 9.8* 9.1*  HCT 32.2* 33.4* 33.7* 31.8* 30.9* 29.1*  MCV 79.7* 80.3 78.9* 78.1* 78.2* 78.4*  PLT 297 287 333 292 347 323    Cardiac Enzymes: No results for input(s): CKTOTAL, CKMB, CKMBINDEX, TROPONINI in the last 168 hours. BNP (last 3 results) Recent Labs  03/23/20 0922  PROBNP 520*    CBG: No results for input(s): GLUCAP in the last 168 hours. D-Dimer: No results for input(s): DDIMER in the last 72 hours. Hgb A1c: No results for input(s): HGBA1C in the last 72 hours. Lipid Profile: No results for input(s): CHOL, HDL, LDLCALC, TRIG, CHOLHDL, LDLDIRECT in the last 72 hours. Thyroid function studies: No results for input(s): TSH, T4TOTAL, T3FREE, THYROIDAB in the last 72 hours.  Invalid input(s): FREET3 Anemia work up: No results for input(s): VITAMINB12, FOLATE, FERRITIN, TIBC, IRON, RETICCTPCT in the last 72 hours. Sepsis Labs: Recent Labs  Lab 03/09/21 0414 03/09/21 0904 03/10/21 0901 03/11/21 1038 03/14/21 0400 03/15/21 0415  PROCALCITON 0.11  --   --   --   --   --   WBC  --    < > 14.2* 12.7* 13.1* 12.0*   < > = values in this interval not displayed.    Microbiology Recent Results (from the past 240 hour(s))  Resp Panel by RT-PCR (Flu A&B, Covid) Nasopharyngeal Swab     Status: None   Collection Time: 03/07/21 12:00 PM   Specimen:  Nasopharyngeal Swab; Nasopharyngeal(NP) swabs in vial transport medium  Result Value Ref Range Status   SARS Coronavirus 2 by RT PCR NEGATIVE NEGATIVE Final    Comment: (NOTE) SARS-CoV-2 target nucleic acids are NOT DETECTED.  The SARS-CoV-2 RNA is generally detectable in upper respiratory specimens during the acute phase of infection. The lowest concentration of SARS-CoV-2 viral copies this assay can detect is 138 copies/mL. A negative result does not preclude SARS-Cov-2 infection and should not be used as the sole basis for treatment or other patient management decisions. A negative result may occur with  improper specimen collection/handling, submission of specimen other than nasopharyngeal swab, presence of viral mutation(s) within the areas targeted by this assay, and inadequate number of viral copies(<138 copies/mL). A negative result must be combined with clinical observations, patient history, and epidemiological information. The expected result is Negative.  Fact Sheet for Patients:  EntrepreneurPulse.com.au  Fact Sheet for Healthcare Providers:  IncredibleEmployment.be  This test is no t yet approved or cleared by the [place] FDA and  has been authorized for detection and/or diagnosis of SARS-CoV-2 by FDA under an Emergency Use Authorization (EUA). This EUA will remain  in effect (meaning this test can be used) for the duration of the COVID-19 declaration under Section 564(b)(1) of the Act, 21 U.S.C.section 360bbb-3(b)(1), unless the authorization is terminated  or revoked sooner.       Influenza A by PCR NEGATIVE NEGATIVE Final   Influenza B by PCR NEGATIVE NEGATIVE Final    Comment: (NOTE) The Xpert Xpress SARS-CoV-2/FLU/RSV plus assay is intended as an aid in the diagnosis of influenza from Nasopharyngeal swab specimens and should not be used as a sole basis for treatment. Nasal washings and aspirates are unacceptable for  Xpert Xpress SARS-CoV-2/FLU/RSV testing.  Fact Sheet for Patients: EntrepreneurPulse.com.au  Fact Sheet for Healthcare Providers: IncredibleEmployment.be  This test is not yet approved or cleared by the [place] FDA and has been authorized for detection and/or diagnosis of SARS-CoV-2 by FDA under an Emergency Use Authorization (EUA). This EUA will remain in effect (meaning this test can be used) for the duration of the COVID-19 declaration under Section 564(b)(1) of the Act, 21 U.S.C. section 360bbb-3(b)(1), unless the authorization is terminated or revoked.  Performed at [facility], [place] [address], [place], [place] [address]   Respiratory (~20 pathogens) panel by  PCR     Status: None   Collection Time: 03/07/21 12:00 PM   Specimen: Nasopharyngeal Swab; Respiratory  Result Value Ref Range Status   Adenovirus NOT DETECTED NOT DETECTED Final   Coronavirus 229E NOT DETECTED NOT DETECTED Final    Comment: (NOTE) The Coronavirus on the Respiratory Panel, DOES NOT test for the novel  Coronavirus (2019 nCoV)    Coronavirus HKU1 NOT DETECTED NOT DETECTED Final   Coronavirus NL63 NOT DETECTED NOT DETECTED Final   Coronavirus OC43 NOT DETECTED NOT DETECTED Final   Metapneumovirus NOT DETECTED NOT DETECTED Final   Rhinovirus / Enterovirus NOT DETECTED NOT DETECTED Final   Influenza A NOT DETECTED NOT DETECTED Final   Influenza B NOT DETECTED NOT DETECTED Final   Parainfluenza Virus 1 NOT DETECTED NOT DETECTED Final   Parainfluenza Virus 2 NOT DETECTED NOT DETECTED Final   Parainfluenza Virus 3 NOT DETECTED NOT DETECTED Final   Parainfluenza Virus 4 NOT DETECTED NOT DETECTED Final   Respiratory Syncytial Virus NOT DETECTED NOT DETECTED Final   Bordetella pertussis NOT DETECTED NOT DETECTED Final   Bordetella Parapertussis NOT DETECTED NOT DETECTED Final   Chlamydophila pneumoniae NOT DETECTED NOT DETECTED Final   Mycoplasma pneumoniae  NOT DETECTED NOT DETECTED Final    Comment: Performed at [facility], [place]. [address], [place], [place] [address]  Blood culture (routine x 2)     Status: None   Collection Time: 03/07/21 12:54 PM   Specimen: BLOOD LEFT WRIST  Result Value Ref Range Status   Specimen Description BLOOD LEFT WRIST  Final   Special Requests   Final    BOTTLES DRAWN AEROBIC AND ANAEROBIC Blood Culture adequate volume   Culture   Final    NO GROWTH 5 DAYS Performed at [facility], [place] [address] [place], [place] [address]    Report Status 03/12/2021 FINAL  Final  Blood culture (routine x 2)     Status: None   Collection Time: 03/07/21  4:41 PM   Specimen: Site Not Specified; Blood  Result Value Ref Range Status   Specimen Description SITE NOT SPECIFIED  Final   Special Requests   Final    BOTTLES DRAWN AEROBIC AND ANAEROBIC Blood Culture results may not be optimal due to an inadequate volume of blood received in culture bottles   Culture   Final    NO GROWTH 5 DAYS Performed at [facility], [place] [address] [place], [place] [address]    Report Status 03/12/2021 FINAL  Final  [name] Next Gen by PCR, Nasal     Status: None   Collection Time: 03/07/21 10:15 PM   Specimen: Nasal Mucosa; Nasal Swab  Result Value Ref Range Status   [name] by PCR Next Gen NOT DETECTED NOT DETECTED Final    Comment: (NOTE) The GeneXpert [name] Assay (FDA approved for NASAL specimens only), is one component of a comprehensive [name] colonization surveillance program. It is not intended to diagnose [name] infection nor to guide or monitor treatment for [name] infections. Test performance is not FDA approved in patients less than 12 years old. Performed at [facility], [place] [address], [place], [place] [address]   Urine Culture     Status: None   Collection Time: 03/09/21  5:25 PM   Specimen: Urine, Clean Catch  Result Value Ref Range Status   Specimen Description URINE, CLEAN CATCH  Final   Special  Requests NONE  Final   Culture  Final    NO GROWTH Performed at [facility], [place] [address] [place], [place] [address]    Report Status 03/11/2021 FINAL  Final     Medications:    (feeding supplement) PROSource Plus  30 mL Oral TID BM   amoxicillin-clavulanate  1 tablet Oral Q12H   apixaban  5 mg Oral BID   atorvastatin  80 mg Oral QPM   Chlorhexidine Gluconate Cloth  6 each Topical Daily   digoxin  0.125 mg Oral Daily   diphenhydrAMINE  50 mg Oral QHS   ezetimibe  10 mg Oral Daily   feeding supplement  1 Container Oral TID BM   furosemide  40 mg Oral Daily   gabapentin  200 mg Oral TID   ivabradine  7.5 mg Oral BID WC   lidocaine  1 patch Transdermal Q24H   melatonin  5 mg Oral QHS   midodrine  10 mg Oral TID WC   multivitamin with minerals  1 tablet Oral Daily   pantoprazole  40 mg Oral Daily   potassium chloride  40 mEq Oral Daily   sertraline  50 mg Oral Daily   sodium chloride flush  10-40 mL Intracatheter Q12H   tamsulosin  0.4 mg Oral Daily   Continuous Infusions:  milrinone 0.125 mcg/kg/min (03/15/21 0357)      LOS: 7 days   Charlynne Cousins  [facility]  03/15/2021, 8:48 AM

## 2021-03-15 NOTE — Progress Notes (Signed)
At 11:26am, patient had 5 beat run of vtach, Dr Everlena Cooper notified

## 2021-03-15 NOTE — Progress Notes (Signed)
Calorie Count Note: Day 1 Results  72-hour calorie count ordered. Calorie count started on 11/3 at lunch meal. Please see day 1 results below. RD will follow up Monday, 03/19/21, to provide day 2 and day 3 results.  Diet: Heart Healthy, thin liquids Supplements:  - Ensure Enlive BID - ProSource Plus 30 ml TID  11/3 Lunch: 418 kcal, 15 grams of protein 11/3 Dinner: 561 kcal, 25 grams of protein 11/4 Breakfast: 651 kcal, 11 grams of protein Snacks: 470 kcal, 4 grams of protein Supplements: 1350 kcal, 105 grams of protein  Day 1 total 24-hour intake: 3450 kcal (>100% of minimum estimated needs)  160 grams of protein (>100% of minimum estimated needs)  Nutrition Diagnosis: Severe malnutrition related to chronic illness (CHF) as evidenced by severe muscle depletion, severe fat depletion.  Goal: Patient will meet greater than or equal to 90% of their needs.  Intervention:  - Continue 72-hour calorie count - Continue Ensure Enlive BID - Continue ProSource Plus 30 ml TID - Continue to encourage PO intake   Gustavus Bryant, MS, RD, LDN Inpatient Clinical Dietitian Please see AMiON for contact information.

## 2021-03-16 DIAGNOSIS — J9621 Acute and chronic respiratory failure with hypoxia: Secondary | ICD-10-CM | POA: Diagnosis not present

## 2021-03-16 LAB — CBC
HCT: 29.2 % — ABNORMAL LOW (ref 39.0–52.0)
Hemoglobin: 9 g/dL — ABNORMAL LOW (ref 13.0–17.0)
MCH: 24.2 pg — ABNORMAL LOW (ref 26.0–34.0)
MCHC: 30.8 g/dL (ref 30.0–36.0)
MCV: 78.5 fL — ABNORMAL LOW (ref 80.0–100.0)
Platelets: 328 10*3/uL (ref 150–400)
RBC: 3.72 MIL/uL — ABNORMAL LOW (ref 4.22–5.81)
RDW: 17.3 % — ABNORMAL HIGH (ref 11.5–15.5)
WBC: 11.6 10*3/uL — ABNORMAL HIGH (ref 4.0–10.5)
nRBC: 0 % (ref 0.0–0.2)

## 2021-03-16 LAB — BASIC METABOLIC PANEL
Anion gap: 4 — ABNORMAL LOW (ref 5–15)
BUN: 14 mg/dL (ref 8–23)
CO2: 28 mmol/L (ref 22–32)
Calcium: 8.9 mg/dL (ref 8.9–10.3)
Chloride: 101 mmol/L (ref 98–111)
Creatinine, Ser: 0.54 mg/dL — ABNORMAL LOW (ref 0.61–1.24)
GFR, Estimated: >60 mL/min (ref >60)
Glucose, Bld: 128 mg/dL — ABNORMAL HIGH (ref 70–99)
Potassium: 3.9 mmol/L (ref 3.5–5.1)
Sodium: 133 mmol/L — ABNORMAL LOW (ref 135–145)

## 2021-03-16 LAB — COOXEMETRY PANEL
Carboxyhemoglobin: 1.4 % (ref 0.5–1.5)
Methemoglobin: 0.6 % (ref 0.0–1.5)
O2 Saturation: 62.1 %
Total hemoglobin: 9.4 g/dL — ABNORMAL LOW (ref 12.0–16.0)

## 2021-03-16 LAB — MAGNESIUM: Magnesium: 2 mg/dL (ref 1.7–2.4)

## 2021-03-16 NOTE — Progress Notes (Signed)
Mobility Specialist Progress Note    03/16/21 1521  Mobility  Activity Ambulated in hall  Level of Assistance Independent after set-up  Assistive Device None (IV pole)  Distance Ambulated (ft) 415 ft  Mobility Ambulated independently in hallway  Mobility Response Tolerated well  Mobility performed by Mobility specialist  $Mobility charge 1 Mobility   During Mobility: 93% SpO2 Post-Mobility: 97% SpO2  Pt received in bed and agreeable. Ambulated on 2LO2. Maintained 93-94% during while holding conversation and took no breaks. Returned to bed with call bell in reach.  Hildred Alamin Mobility Specialist  Mobility Specialist Phone: [phone]

## 2021-03-16 NOTE — Progress Notes (Signed)
CARDIAC REHAB PHASE I   Spoke with pt about nutrition and ambulation. Pt states he feels like he is getting stronger. Provided support and encouragement. Pt denies questions or concerns about his care or the plan. Will continue to follow throughout his hospital stay.  [id number] Rufina Falco, RN BSN 03/16/2021 11:50 AM

## 2021-03-16 NOTE — Progress Notes (Addendum)
Patient ID: Brandon [name], male   DOB: 1955-10-31, 65 y.o.   MRN: [id number]     Advanced Heart Failure Rounding Note  PCP-Cardiologist: [name], MD   Subjective:    Admitted with sepsis. Started on cefepime + vanc, now off abx (ID following). Afebrile.  10/27 started on ivabradine 7.5 mg twice a day and midodrine 5 mg TID 10/29 Co-ox 48%, started on milrinone 0.25.  10/30 IV Antibiotics stopped. Blood cultures no growth for 5 days.  10/31 started on Augmentin for PNA.  11/1 Milrinone off 11/2 Milrinone added back. Midodrine increased to 10 mg TID  Milrinone restarted at 0.125. CO-OX 62%   On Augmentin. WBC 11.6.   VAD w/u underway. Prealbumin low at 11.2. Drinking supplements.   SOB with exertion. Able to walk 750 feet with therapy.   Objective:   Weight Range: 76 kg Body mass index is 22.72 kg/m.   Vital Signs:   Temp:  [97.6 F (36.4 C)-98.7 F (37.1 C)] 98.3 F (36.8 C) (11/04 0750) Pulse Rate:  [83-96] 89 (11/04 0750) Resp:  [18-27] 27 (11/04 0750) BP: (93-105)/(57-66) 98/63 (11/04 0750) SpO2:  [94 %-98 %] 94 % (11/04 0750) Weight:  [76 kg] 76 kg (11/04 0630) Last BM Date: 03/13/21  Weight change: Filed Weights   03/14/21 0602 03/15/21 0500 03/16/21 0630  Weight: 75.8 kg 75.5 kg 76 kg    Intake/Output:   Intake/Output Summary (Last 24 hours) at 03/16/2021 0849 Last data filed at 03/16/2021 0807 Gross per 24 hour  Intake 1328.9 ml  Output 1700 ml  Net -371.1 ml    CVP 3-4 personally checked.   Physical Exam  General:  No resp difficulty HEENT: normal Neck: supple. no JVD. Carotids 2+ bilat; no bruits. No lymphadenopathy or thryomegaly appreciated. Cor: PMI nondisplaced. Regular rate & rhythm. No rubs, gallops or murmurs. Lungs: LLL crackles. On 2 liters.  Abdomen: soft, nontender, nondistended. No hepatosplenomegaly. No bruits or masses. Good bowel sounds. Extremities: no cyanosis, clubbing, rash, edema. LUE PICC Neuro: alert & orientedx3,  cranial nerves grossly intact. moves all 4 extremities w/o difficulty. Affect pleasant  Telemetry   NSR 80-90s   EKG    N/A   Labs    CBC Recent Labs    03/15/21 0415 03/16/21 0320  WBC 12.0* 11.6*  HGB 9.1* 9.0*  HCT 29.1* 29.2*  MCV 78.4* 78.5*  PLT 323 301   Basic Metabolic Panel Recent Labs    03/15/21 0415 03/16/21 0320  NA 133* 133*  K 3.8 3.9  CL 101 101  CO2 27 28  GLUCOSE 112* 128*  BUN 13 14  CREATININE 0.58* 0.54*  CALCIUM 8.8* 8.9  MG 2.0 2.0   Liver Function Tests No results for input(s): AST, ALT, ALKPHOS, BILITOT, PROT, ALBUMIN in the last 72 hours.  No results for input(s): LIPASE, AMYLASE in the last 72 hours. Cardiac Enzymes No results for input(s): CKTOTAL, CKMB, CKMBINDEX, TROPONINI in the last 72 hours.  BNP: BNP (last 3 results) Recent Labs    11/30/20 0144 01/08/21 0731 01/18/21 1629  BNP 2,653.1* 734.2* 607.9*    ProBNP (last 3 results) Recent Labs    03/23/20 0922  PROBNP 520*     D-Dimer No results for input(s): DDIMER in the last 72 hours. Hemoglobin A1C No results for input(s): HGBA1C in the last 72 hours. Fasting Lipid Panel No results for input(s): CHOL, HDL, LDLCALC, TRIG, CHOLHDL, LDLDIRECT in the last 72 hours. Thyroid Function Tests No results for input(s):  TSH, T4TOTAL, T3FREE, THYROIDAB in the last 72 hours.  Invalid input(s): FREET3  Other results:   Imaging    No results found.   Medications:     Scheduled Medications:  (feeding supplement) PROSource Plus  30 mL Oral TID BM   amoxicillin-clavulanate  1 tablet Oral Q12H   apixaban  5 mg Oral BID   atorvastatin  80 mg Oral QPM   Chlorhexidine Gluconate Cloth  6 each Topical Daily   digoxin  0.125 mg Oral Daily   diphenhydrAMINE  50 mg Oral QHS   ezetimibe  10 mg Oral Daily   feeding supplement  237 mL Oral BID BM   furosemide  40 mg Oral Daily   gabapentin  200 mg Oral TID   ivabradine  7.5 mg Oral BID WC   lidocaine  1 patch  Transdermal Q24H   melatonin  5 mg Oral QHS   midodrine  10 mg Oral TID WC   multivitamin with minerals  1 tablet Oral Daily   pantoprazole  40 mg Oral Daily   potassium chloride  40 mEq Oral Daily   potassium chloride  40 mEq Oral Once   sertraline  50 mg Oral Daily   sodium chloride flush  10-40 mL Intracatheter Q12H   tamsulosin  0.4 mg Oral Daily    Infusions:  milrinone 0.125 mcg/kg/min (03/16/21 0310)     PRN Medications: acetaminophen **OR** acetaminophen, guaiFENesin, ondansetron **OR** ondansetron (ZOFRAN) IV, sodium chloride flush    Patient Profile   Brandon [name] is a 65 year old with history of smoker, CAD s/p previous MI, HTN, HL, chronic HFeEF, sepsis 11/2020 bacteremia.  Admitted with sepsis. Bld Cx - NGTD    Assessment/Plan   Sepsis - H/o strep bacteremia 11/2020.  ICD extracted 12/04/20. Barostim was not removed as it is extravascular.  - Had been on IV antibiotics until 01/15/21. Blood cultures negative 01/30/21. - WBCs 111.6. Afebrile.  - Procalcitonin 0.22->0.11 - 10/26 -Blood CX x2  No growth 5 days.  - Ct chest concerning for pneumonia.  ?PNA => initially on cefepime/vancomycin, now off both.   - Started Augmentin 10/31. ID has seen.  2. Acute Hypoxic Respiratory Failure  - Prior to admit using oxygen as needed.  - Oxygen requirements reduced to 2 liters. Sats stable.  - CXR with pulmonary edema initially, and was diuresed with IV Lasix.  -  CT chest completed 10/30 concerning for bronchopneumonia. On augmentin.    3. Chronic Systolic HFr EF - Echo 09/12/64 EF 20-25% RV mildly HK. moderate AS  Mean gradient 13 AVA 1.2 cm2 DI 0.30 - RHC (7/22): EF 20%, low filling pressures. CI 2.3.  - 11/2 Restarted 0.125 milrinone  - CO-OX improved 62%.  - CVP 3-4. Continue lasix 40 mg po daily.   - No bb with low output HF - Continue ivabradine 7.5 mg twice a day.  - Continue digoxin. Dig level ok (0.3)  - Continue midodrine 10 mg TID   - [name] confirmed he is not a  candidate for transplant.  . Prealbumin is 11.  - Palliative Care consulted for VAD work up.    4. CAD  s/p anterior STEMI (12/20). LHC showed chronically occluded RCA (with L>>R collaterals) and thrombotic occlusion of proximal LAD. Underwent PCI of LAD. - LHC (7/22) with stable CAD.  - No chest pain.   - Continue atorvastatin 80. No aspirin d/t anticoagulation.   5. H./O RUE DVT On eliquis.   6. Severe protein-calorie  malnutrition - prealbumin 11 - Started on oral supplement.   7. Anemia - 9  - MCV normal - Monitor with anticoagulation - No obvious bleeding  Continue LVAD work up.   [name] --> Deemed not to be transplant candidate   Length of Stay: [place], NP  03/16/2021, 8:49 AM  Advanced Heart Failure Team Pager [phone] (M-F; 7a - 5p)  Please contact [facility] for night-coverage after hours (5p -7a ) and weekends on [email or web address]  Patient seen and examined with the above-signed Advanced Practice Provider and/or Housestaff. I personally reviewed laboratory data, imaging studies and relevant notes. I independently examined the patient and formulated the important aspects of the plan. I have edited the note to reflect any of my changes or salient points. I have personally discussed the plan with the patient and/or family.  Continues to feel better on milrinone Co-ox 62% CVP 3-4. No further orthopnea or PND. Seen by Nutrition yesterday who recommended Cor-trak feeds at night but he prefers to avoid. Has increased his po intake.   General:  Sitting in chair No resp difficulty HEENT: normal Neck: supple. no JVD. Carotids 2+ bilat; no bruits. No lymphadenopathy or thryomegaly appreciated. Cor: PMI nondisplaced. Regular rate & rhythm. 2/6 AS Lungs: clear Abdomen: soft, nontender, nondistended. No hepatosplenomegaly. No bruits or masses. Good bowel sounds. Extremities: no cyanosis, clubbing, rash, edema Neuro: alert & orientedx3, cranial nerves grossly intact. moves  all 4 extremities w/o difficulty. Affect pleasant  Continues to improve with milrinone support. I d/w Dr. Cyndia Bent. Will try to support in house with milrinone, PT/OT and adequate nutrition with eye toward VAD in early December.May be able to go home for a short perid of time prior to VAD if he is improving.   Glori Bickers, MD  5:02 PM

## 2021-03-16 NOTE — Progress Notes (Signed)
This chaplain responded to PMT consult for creating/updating the Pt. Advance Directive.   The Pt. is sitting up in the bedside recliner and clarifies he has an Advance Directive at home.  The Pt. sister-Patty is at the bedside and agrees to bring the AD to the hospital on Monday.  The chaplain is appreciative of a page from the Pt. RN when [name] arrives with the AD. The chaplain will scan into the Pt. EMR at this time.  The chaplain understands the Pt. Sister-Patty is his HCPOA.  Chaplain Sallyanne Kuster [phone]

## 2021-03-16 NOTE — Progress Notes (Signed)
[facility] PROGRESS NOTE    Progress Note  ESTLE HUGULEY  XBJ:[id number] DOB: 1956-03-30 DOA: 03/07/2021 PCP: [name], MD     Brief Narrative:   JYREN CERASOLI is an 65 y.o. male past medical history of coronary artery disease, chronic systolic heart failure with an AICD extracted on 12/01/2020 due to infection completed antibiotics on 01/15/2021 Rocephin, moderate aortic stenosis right upper extremity DVT comes into the ED for acute onset of dyspnea, was found hypoxic placed on 2 L of oxygen found to be in acute decompensated heart failure, cardiology consulted   Significant Events: 10/29 Co-ox 48%, started on milrinone 0.25.  10/30 IV Antibiotics stopped. Blood cultures no growth for 5 days.  10/31 started on augmentin. Milrinone cut back to 0.125 mcg.  03/12/2021 started on midodrine post was persistently symptomatic. 03/24/2021 started back on milrinone  Antibiotics: 03/07/2021 IV Vanco and cefepime till 03/10/2021 03/11/2021 Augmentin  Microbiology data: 03/07/2021 blood culture: Remain negative till date  Procedures: None  Assessment/Plan:   Acute on chronic respiratory failure with hypoxia ([place]) possibly due to pneumonia/sepsis: High-resolution CT scan showed bronchopneumonia, no LDL. Continue Augmentin for total of 7 days.   Physical therapy recommended no home health PT.  Chronic systolic heart failure: Last 2D echo in July 2022 showed an EF of 20%. Advanced heart failure team on board. Currently on ivabradine, digoxin, midodrine 3 times a day. Currently on milrinone and his symptoms have been improved. Palliative care has been consulted.  Dyslipidemia:  Continue statin therapy  Right upper extremity DVT with neuropathy: Continue apixaban and gabapentin.  Depression: Continue sertraline.  BPH: Continue Flomax.  Hypokalemia: Continue to replete and recheck.  Mild hyponatremia: Likely due to heart failure.   Protein-calorie  malnutrition, severe Ensure 3 times daily  Stage I sacral decubitus ulcer present on admission stage I RN Pressure Injury Documentation: Pressure Injury 12/01/20 Coccyx Medial Stage 1 -  Intact skin with non-blanchable redness of a localized area usually over a bony prominence. (Active)  12/01/20 0800  Location: Coccyx  Location Orientation: Medial  Staging: Stage 1 -  Intact skin with non-blanchable redness of a localized area usually over a bony prominence.  Wound Description (Comments):   Present on Admission:      Pressure Injury 12/02/20 Vertebral column Posterior;Mid blanchable but red (Active)  12/02/20 2000  Location: Vertebral column  Location Orientation: Posterior;Mid  Staging:   Wound Description (Comments): blanchable but red  Present on Admission: No      DVT prophylaxis: lovenox Family Communication:none Status is: Inpatient  Remains inpatient appropriate because: acute severity of liness    Code Status:     Code Status Orders  (From admission, onward)           Start     Ordered   03/07/21 1641  Full code  Continuous        03/07/21 1642           Code Status History     Date Active Date Inactive Code Status Order ID Comments User Context   01/08/2021 1003 01/09/2021 1426 Full Code [id number]  Conrad [place], NP ED   11/29/2020 1709 12/11/2020 2058 Full Code [id number]  Conrad Young, NP Inpatient   11/29/2020 1107 11/29/2020 1709 Full Code [id number]  Jolaine Artist, MD Inpatient   12/31/2019 1107 12/31/2019 2205 Full Code [id number]  Ortencia Kick Inpatient   08/31/2019 1712 09/01/2019 1518 Full Code [id number]  [name], MD Inpatient  04/27/2019 1927 05/04/2019 1953 Full Code [id number]  [name], MD Inpatient   04/27/2019 1926 04/27/2019 1927 Full Code [id number]  Lonn Georgia, PA-C Inpatient         IV Access:   Peripheral IV   Procedures and diagnostic studies:   No results found.   Medical Consultants:    None.   Subjective:    Jane Broughton Bell orthopnea significantly improved today.  Objective:    Vitals:   03/15/21 2321 03/16/21 0310 03/16/21 0630 03/16/21 0750  BP: 99/66 101/63  98/63  Pulse: 83 86  89  Resp: (!) 24 20  (!) 27  Temp: 97.6 F (36.4 C) 98.1 F (36.7 C)  98.3 F (36.8 C)  TempSrc: Oral Oral  Oral  SpO2: 95% 94%  94%  Weight:   76 kg   Height:       SpO2: 94 % O2 Flow Rate (L/min): 2 L/min   Intake/Output Summary (Last 24 hours) at 03/16/2021 0909 Last data filed at 03/16/2021 0807 Gross per 24 hour  Intake 1328.9 ml  Output 1700 ml  Net -371.1 ml    Filed Weights   03/14/21 0602 03/15/21 0500 03/16/21 0630  Weight: 75.8 kg 75.5 kg 76 kg    Exam: General exam: In no acute distress. Respiratory system: Good air movement and clear to auscultation. Cardiovascular system: S1 & S2 heard, RRR. No JVD. Gastrointestinal system: Abdomen is nondistended, soft and nontender.  Extremities: No pedal edema. Skin: No rashes, lesions or ulcers  Data Reviewed:    Labs: Basic Metabolic Panel: Recent Labs  Lab 03/12/21 0446 03/13/21 0621 03/14/21 0400 03/15/21 0415 03/16/21 0320  NA 128* 132* 130* 133* 133*  K 3.8 4.2 4.0 3.8 3.9  CL 97* 99 99 101 101  CO2 27 25 26 27 28   GLUCOSE 134* 104* 96 112* 128*  BUN 16 14 13 13 14   CREATININE 0.54* 0.58* 0.50* 0.58* 0.54*  CALCIUM 8.8* 8.9 8.8* 8.8* 8.9  MG 1.9 2.0 2.0 2.0 2.0    GFR Estimated Creatinine Clearance: 96.3 mL/min (A) (by C-G formula based on SCr of 0.54 mg/dL (L)). Liver Function Tests: No results for input(s): AST, ALT, ALKPHOS, BILITOT, PROT, ALBUMIN in the last 168 hours.  No results for input(s): LIPASE, AMYLASE in the last 168 hours. No results for input(s): AMMONIA in the last 168 hours. Coagulation profile No results for input(s): INR, PROTIME in the last 168 hours. COVID-19 Labs  No results for input(s): DDIMER, FERRITIN, LDH, CRP in the last 72 hours.  Lab Results   Component Value Date   SARSCOV2NAA NEGATIVE 03/07/2021   SARSCOV2NAA NEGATIVE 01/08/2021   [place] NEGATIVE 12/04/2020   [place] NEGATIVE 11/14/2020    CBC: Recent Labs  Lab 03/10/21 0901 03/11/21 1038 03/14/21 0400 03/15/21 0415 03/16/21 0320  WBC 14.2* 12.7* 13.1* 12.0* 11.6*  NEUTROABS 12.7* 10.8*  --   --   --   HGB 10.6* 10.1* 9.8* 9.1* 9.0*  HCT 33.7* 31.8* 30.9* 29.1* 29.2*  MCV 78.9* 78.1* 78.2* 78.4* 78.5*  PLT 333 292 347 323 328    Cardiac Enzymes: No results for input(s): CKTOTAL, CKMB, CKMBINDEX, TROPONINI in the last 168 hours. BNP (last 3 results) Recent Labs    03/23/20 0922  PROBNP 520*    CBG: No results for input(s): GLUCAP in the last 168 hours. D-Dimer: No results for input(s): DDIMER in the last 72 hours. Hgb A1c: No results for input(s): HGBA1C in the last 72 hours.  Lipid Profile: No results for input(s): CHOL, HDL, LDLCALC, TRIG, CHOLHDL, LDLDIRECT in the last 72 hours. Thyroid function studies: No results for input(s): TSH, T4TOTAL, T3FREE, THYROIDAB in the last 72 hours.  Invalid input(s): FREET3 Anemia work up: No results for input(s): VITAMINB12, FOLATE, FERRITIN, TIBC, IRON, RETICCTPCT in the last 72 hours. Sepsis Labs: Recent Labs  Lab 03/11/21 1038 03/14/21 0400 03/15/21 0415 03/16/21 0320  WBC 12.7* 13.1* 12.0* 11.6*    Microbiology Recent Results (from the past 240 hour(s))  Resp Panel by RT-PCR (Flu A&B, Covid) Nasopharyngeal Swab     Status: None   Collection Time: 03/07/21 12:00 PM   Specimen: Nasopharyngeal Swab; Nasopharyngeal(NP) swabs in vial transport medium  Result Value Ref Range Status   SARS Coronavirus 2 by RT PCR NEGATIVE NEGATIVE Final    Comment: (NOTE) SARS-CoV-2 target nucleic acids are NOT DETECTED.  The SARS-CoV-2 RNA is generally detectable in upper respiratory specimens during the acute phase of infection. The lowest concentration of SARS-CoV-2 viral copies this assay can detect  is 138 copies/mL. A negative result does not preclude SARS-Cov-2 infection and should not be used as the sole basis for treatment or other patient management decisions. A negative result may occur with  improper specimen collection/handling, submission of specimen other than nasopharyngeal swab, presence of viral mutation(s) within the areas targeted by this assay, and inadequate number of viral copies(<138 copies/mL). A negative result must be combined with clinical observations, patient history, and epidemiological information. The expected result is Negative.  Fact Sheet for Patients:  EntrepreneurPulse.com.au  Fact Sheet for Healthcare Providers:  IncredibleEmployment.be  This test is no t yet approved or cleared by the [place] FDA and  has been authorized for detection and/or diagnosis of SARS-CoV-2 by FDA under an Emergency Use Authorization (EUA). This EUA will remain  in effect (meaning this test can be used) for the duration of the COVID-19 declaration under Section 564(b)(1) of the Act, 21 U.S.C.section 360bbb-3(b)(1), unless the authorization is terminated  or revoked sooner.       Influenza A by PCR NEGATIVE NEGATIVE Final   Influenza B by PCR NEGATIVE NEGATIVE Final    Comment: (NOTE) The Xpert Xpress SARS-CoV-2/FLU/RSV plus assay is intended as an aid in the diagnosis of influenza from Nasopharyngeal swab specimens and should not be used as a sole basis for treatment. Nasal washings and aspirates are unacceptable for Xpert Xpress SARS-CoV-2/FLU/RSV testing.  Fact Sheet for Patients: EntrepreneurPulse.com.au  Fact Sheet for Healthcare Providers: IncredibleEmployment.be  This test is not yet approved or cleared by the [place] FDA and has been authorized for detection and/or diagnosis of SARS-CoV-2 by FDA under an Emergency Use Authorization (EUA). This EUA will remain in effect  (meaning this test can be used) for the duration of the COVID-19 declaration under Section 564(b)(1) of the Act, 21 U.S.C. section 360bbb-3(b)(1), unless the authorization is terminated or revoked.  Performed at [facility], [place] [address], [place], [place] [address]   Respiratory (~20 pathogens) panel by PCR     Status: None   Collection Time: 03/07/21 12:00 PM   Specimen: Nasopharyngeal Swab; Respiratory  Result Value Ref Range Status   Adenovirus NOT DETECTED NOT DETECTED Final   Coronavirus 229E NOT DETECTED NOT DETECTED Final    Comment: (NOTE) The Coronavirus on the Respiratory Panel, DOES NOT test for the novel  Coronavirus (2019 nCoV)    Coronavirus HKU1 NOT DETECTED NOT DETECTED Final   Coronavirus NL63 NOT DETECTED NOT DETECTED  Final   Coronavirus OC43 NOT DETECTED NOT DETECTED Final   Metapneumovirus NOT DETECTED NOT DETECTED Final   Rhinovirus / Enterovirus NOT DETECTED NOT DETECTED Final   Influenza A NOT DETECTED NOT DETECTED Final   Influenza B NOT DETECTED NOT DETECTED Final   Parainfluenza Virus 1 NOT DETECTED NOT DETECTED Final   Parainfluenza Virus 2 NOT DETECTED NOT DETECTED Final   Parainfluenza Virus 3 NOT DETECTED NOT DETECTED Final   Parainfluenza Virus 4 NOT DETECTED NOT DETECTED Final   Respiratory Syncytial Virus NOT DETECTED NOT DETECTED Final   Bordetella pertussis NOT DETECTED NOT DETECTED Final   Bordetella Parapertussis NOT DETECTED NOT DETECTED Final   Chlamydophila pneumoniae NOT DETECTED NOT DETECTED Final   Mycoplasma pneumoniae NOT DETECTED NOT DETECTED Final    Comment: Performed at [facility], [place] [address] [place], [place] [address]  Blood culture (routine x 2)     Status: None   Collection Time: 03/07/21 12:54 PM   Specimen: BLOOD LEFT WRIST  Result Value Ref Range Status   Specimen Description BLOOD LEFT WRIST  Final   Special Requests   Final    BOTTLES DRAWN AEROBIC AND ANAEROBIC Blood Culture adequate volume    Culture   Final    NO GROWTH 5 DAYS Performed at [facility], [place] [address] [place], [place] [address]    Report Status 03/12/2021 FINAL  Final  Blood culture (routine x 2)     Status: None   Collection Time: 03/07/21  4:41 PM   Specimen: Site Not Specified; Blood  Result Value Ref Range Status   Specimen Description SITE NOT SPECIFIED  Final   Special Requests   Final    BOTTLES DRAWN AEROBIC AND ANAEROBIC Blood Culture results may not be optimal due to an inadequate volume of blood received in culture bottles   Culture   Final    NO GROWTH 5 DAYS Performed at [facility], [place] [address] [place], [place] [address]    Report Status 03/12/2021 FINAL  Final  [name] Next Gen by PCR, Nasal     Status: None   Collection Time: 03/07/21 10:15 PM   Specimen: Nasal Mucosa; Nasal Swab  Result Value Ref Range Status   [name] by PCR Next Gen NOT DETECTED NOT DETECTED Final    Comment: (NOTE) The GeneXpert [name] Assay (FDA approved for NASAL specimens only), is one component of a comprehensive [name] colonization surveillance program. It is not intended to diagnose [name] infection nor to guide or monitor treatment for [name] infections. Test performance is not FDA approved in patients less than 18 years old. Performed at [facility], [place] [address], [place], [place] [address]   Urine Culture     Status: None   Collection Time: 03/09/21  5:25 PM   Specimen: Urine, Clean Catch  Result Value Ref Range Status   Specimen Description URINE, CLEAN CATCH  Final   Special Requests NONE  Final   Culture   Final    NO GROWTH Performed at [facility], [place] [address], [place], [place] [address]    Report Status 03/11/2021 FINAL  Final     Medications:    (feeding supplement) PROSource Plus  30 mL Oral TID BM   amoxicillin-clavulanate  1 tablet Oral Q12H   apixaban  5 mg Oral BID   atorvastatin  80 mg Oral QPM   Chlorhexidine Gluconate Cloth  6 each Topical Daily  digoxin  0.125 mg Oral Daily   diphenhydrAMINE  50 mg Oral QHS   ezetimibe  10 mg Oral Daily   feeding supplement  237 mL Oral BID BM   furosemide  40 mg Oral Daily   gabapentin  200 mg Oral TID   ivabradine  7.5 mg Oral BID WC   lidocaine  1 patch Transdermal Q24H   melatonin  5 mg Oral QHS   midodrine  10 mg Oral TID WC   multivitamin with minerals  1 tablet Oral Daily   pantoprazole  40 mg Oral Daily   potassium chloride  40 mEq Oral Daily   potassium chloride  40 mEq Oral Once   sertraline  50 mg Oral Daily   sodium chloride flush  10-40 mL Intracatheter Q12H   tamsulosin  0.4 mg Oral Daily   Continuous Infusions:  milrinone 0.125 mcg/kg/min (03/16/21 0310)      LOS: 8 days   Charlynne Cousins  [facility]  03/16/2021, 9:09 AM

## 2021-03-17 DIAGNOSIS — J9621 Acute and chronic respiratory failure with hypoxia: Secondary | ICD-10-CM | POA: Diagnosis not present

## 2021-03-17 LAB — CBC
HCT: 29.3 % — ABNORMAL LOW (ref 39.0–52.0)
Hemoglobin: 9 g/dL — ABNORMAL LOW (ref 13.0–17.0)
MCH: 24.3 pg — ABNORMAL LOW (ref 26.0–34.0)
MCHC: 30.7 g/dL (ref 30.0–36.0)
MCV: 79 fL — ABNORMAL LOW (ref 80.0–100.0)
Platelets: 354 10*3/uL (ref 150–400)
RBC: 3.71 MIL/uL — ABNORMAL LOW (ref 4.22–5.81)
RDW: 17.5 % — ABNORMAL HIGH (ref 11.5–15.5)
WBC: 11.4 10*3/uL — ABNORMAL HIGH (ref 4.0–10.5)
nRBC: 0 % (ref 0.0–0.2)

## 2021-03-17 LAB — BASIC METABOLIC PANEL
Anion gap: 5 (ref 5–15)
BUN: 14 mg/dL (ref 8–23)
CO2: 26 mmol/L (ref 22–32)
Calcium: 8.4 mg/dL — ABNORMAL LOW (ref 8.9–10.3)
Chloride: 102 mmol/L (ref 98–111)
Creatinine, Ser: 0.49 mg/dL — ABNORMAL LOW (ref 0.61–1.24)
GFR, Estimated: >60 mL/min (ref >60)
Glucose, Bld: 111 mg/dL — ABNORMAL HIGH (ref 70–99)
Potassium: 3.6 mmol/L (ref 3.5–5.1)
Sodium: 133 mmol/L — ABNORMAL LOW (ref 135–145)

## 2021-03-17 LAB — MAGNESIUM: Magnesium: 2 mg/dL (ref 1.7–2.4)

## 2021-03-17 LAB — COOXEMETRY PANEL
Carboxyhemoglobin: 1.8 % — ABNORMAL HIGH (ref 0.5–1.5)
Methemoglobin: 0.6 % (ref 0.0–1.5)
O2 Saturation: 59.5 %
Total hemoglobin: 9.7 g/dL — ABNORMAL LOW (ref 12.0–16.0)

## 2021-03-17 MED ORDER — FUROSEMIDE 10 MG/ML IJ SOLN
40.0000 mg | Freq: Once | INTRAMUSCULAR | Status: AC
  Administered 2021-03-17: 40 mg via INTRAVENOUS
  Filled 2021-03-17: qty 4

## 2021-03-17 NOTE — Progress Notes (Signed)
Patient ID: Brandon [name], male   DOB: Dec 03, 1955, 65 y.o.   MRN: [id number]     Advanced Heart Failure Rounding Note  PCP-Cardiologist: Brandon Ruths, MD   Subjective:    Admitted with sepsis. Started on cefepime + vanc, now off abx (ID following). Afebrile.  10/27 started on ivabradine 7.5 mg twice a day and midodrine 5 mg TID 10/29 Co-ox 48%, started on milrinone 0.25.  10/30 IV Antibiotics stopped. Blood cultures no growth for 5 days.  10/31 started on Augmentin for PNA.  11/1 Milrinone off 11/2 Milrinone added back. Midodrine increased to 10 mg TID  Milrinone restarted at 0.125. CO-OX 60%   On Augmentin. WBC 11.6 -> 11.4. Afebrile   Feels ok. Denies CP, SOB, orthopnea or PND. Taking his protein supplements and Ensure.   Objective:   Weight Range: 76.7 kg Body mass index is 22.93 kg/m.   Vital Signs:   Temp:  [97.8 F (36.6 C)-98.1 F (36.7 C)] 98 F (36.7 C) (11/05 0738) Pulse Rate:  [85-92] 92 (11/05 0738) Resp:  [18-23] 22 (11/05 0738) BP: (92-112)/(56-71) 104/61 (11/05 0738) SpO2:  [93 %-96 %] 95 % (11/05 0738) Weight:  [76.7 kg] 76.7 kg (11/05 0613) Last BM Date: 03/16/21  Weight change: Filed Weights   03/15/21 0500 03/16/21 0630 03/17/21 0613  Weight: 75.5 kg 76 kg 76.7 kg    Intake/Output:   Intake/Output Summary (Last 24 hours) at 03/17/2021 1040 Last data filed at 03/17/2021 1017 Gross per 24 hour  Intake 123.3 ml  Output 1625 ml  Net -1501.7 ml     CVP 3-4 personally checked.   Physical Exam   General:  Sitting in chair. No resp difficulty HEENT: normal Neck: supple. no JVD. Carotids 2+ bilat; no bruits. No lymphadenopathy or thryomegaly appreciated. Cor: PMI nondisplaced. Regular rate & rhythm. 2/6 SEM RSB  Lungs: decreased at bases  Abdomen: soft, nontender, nondistended. No hepatosplenomegaly. No bruits or masses. Good bowel sounds. Extremities: no cyanosis, clubbing, rash, edema Neuro: alert & orientedx3, cranial nerves grossly  intact. moves all 4 extremities w/o difficulty. Affect pleasant  Telemetry   NSR 80-90s Personally reviewed  Labs    CBC Recent Labs    03/16/21 0320 03/17/21 0503  WBC 11.6* 11.4*  HGB 9.0* 9.0*  HCT 29.2* 29.3*  MCV 78.5* 79.0*  PLT 328 510    Basic Metabolic Panel Recent Labs    03/16/21 0320 03/17/21 0503  NA 133* 133*  K 3.9 3.6  CL 101 102  CO2 28 26  GLUCOSE 128* 111*  BUN 14 14  CREATININE 0.54* 0.49*  CALCIUM 8.9 8.4*  MG 2.0 2.0    Liver Function Tests No results for input(s): AST, ALT, ALKPHOS, BILITOT, PROT, ALBUMIN in the last 72 hours.  No results for input(s): LIPASE, AMYLASE in the last 72 hours. Cardiac Enzymes No results for input(s): CKTOTAL, CKMB, CKMBINDEX, TROPONINI in the last 72 hours.  BNP: BNP (last 3 results) Recent Labs    11/30/20 0144 01/08/21 0731 01/18/21 1629  BNP 2,653.1* 734.2* 607.9*     ProBNP (last 3 results) Recent Labs    03/23/20 0922  PROBNP 520*      D-Dimer No results for input(s): DDIMER in the last 72 hours. Hemoglobin A1C No results for input(s): HGBA1C in the last 72 hours. Fasting Lipid Panel No results for input(s): CHOL, HDL, LDLCALC, TRIG, CHOLHDL, LDLDIRECT in the last 72 hours. Thyroid Function Tests No results for input(s): TSH, T4TOTAL, T3FREE, THYROIDAB in the  last 72 hours.  Invalid input(s): FREET3  Other results:   Imaging    No results found.   Medications:     Scheduled Medications:  (feeding supplement) PROSource Plus  30 mL Oral TID BM   amoxicillin-clavulanate  1 tablet Oral Q12H   apixaban  5 mg Oral BID   atorvastatin  80 mg Oral QPM   Chlorhexidine Gluconate Cloth  6 each Topical Daily   digoxin  0.125 mg Oral Daily   diphenhydrAMINE  50 mg Oral QHS   ezetimibe  10 mg Oral Daily   feeding supplement  237 mL Oral BID BM   furosemide  40 mg Oral Daily   gabapentin  200 mg Oral TID   ivabradine  7.5 mg Oral BID WC   lidocaine  1 patch Transdermal Q24H    melatonin  5 mg Oral QHS   midodrine  10 mg Oral TID WC   multivitamin with minerals  1 tablet Oral Daily   pantoprazole  40 mg Oral Daily   potassium chloride  40 mEq Oral Daily   potassium chloride  40 mEq Oral Once   sertraline  50 mg Oral Daily   sodium chloride flush  10-40 mL Intracatheter Q12H   tamsulosin  0.4 mg Oral Daily    Infusions:  milrinone 0.125 mcg/kg/min (03/17/21 0615)     PRN Medications: acetaminophen **OR** acetaminophen, guaiFENesin, ondansetron **OR** ondansetron (ZOFRAN) IV, sodium chloride flush    Patient Profile   [name] is a 65 year old with history of smoker, CAD s/p previous MI, HTN, HL, chronic HFeEF, sepsis 11/2020 bacteremia.  Admitted with sepsis. Bld Cx - NGTD    Assessment/Plan   Sepsis - H/o strep bacteremia 11/2020.  ICD extracted 12/04/20. Barostim was not removed as it is extravascular.  - Had been on IV antibiotics until 01/15/21. Blood cultures negative 01/30/21. - WBCs 11.4 Afebrile.  - Procalcitonin 0.22->0.11 - 10/26 -Blood CX x2  No growth 5 days.  - Ct chest concerning for pneumonia.  ?PNA => initially on cefepime/vancomycin, now off both.   - Started Augmentin 10/31. ID has seen.  2. Acute Hypoxic Respiratory Failure  - CXR with pulmonary edema initially, and was diuresed with IV Lasix.  -  CT chest completed 10/30 concerning for bronchopneumonia. On augmentin.    3. Chronic Systolic HFr EF - Echo 10/14/99 EF 20-25% RV mildly HK. moderate AS  Mean gradient 13 AVA 1.2 cm2 DI 0.30 - RHC (7/22): EF 20%, low filling pressures. CI 2.3.  - 11/2 Restarted 0.125 milrinone  - Co-ox 60% today  - CVP low/stable. Continue lasix 40 mg po daily.   - No bb with low output HF - Continue ivabradine 7.5 mg twice a day.  - Continue digoxin. Dig level ok (0.3)  - Continue midodrine 10 mg TID   - [name] confirmed he is not a candidate for transplant.  - VAD w/u underway. Will try to support in house with milrinone, PT/OT and adequate  nutrition with eye toward VAD in early December. May be able to go home for a short perid of time prior to VAD if he is improving.    4. CAD  s/p anterior STEMI (12/20). LHC showed chronically occluded RCA (with L>>R collaterals) and thrombotic occlusion of proximal LAD. Underwent PCI of LAD. - LHC (7/22) with stable CAD.  - No s/s ischemia  - Continue atorvastatin 80. No aspirin d/t anticoagulation.  5. Severe low-flow aortic stenosis - not candidate for  TAVR due to presence of fibroelastoma - pending VAD   6. H./O RUE DVT - On eliquis. Will need to change to heparin as we get closer to VAD  7. Severe protein-calorie malnutrition - prealbumin 11 - Started on oral supplement.  - calorie count underway  Length of Stay: [place], [place]  03/17/2021, 10:40 AM  Advanced Heart Failure Team Pager [phone] (M-F; 7a - 5p)  Please contact [facility] for night-coverage after hours (5p -7a ) and weekends on [email or web address]

## 2021-03-17 NOTE — Plan of Care (Signed)
  Problem: Education: Goal: Knowledge of General Education information will improve Description: Including pain rating scale, medication(s)/side effects and non-pharmacologic comfort measures Outcome: Progressing   Problem: Health Behavior/Discharge Planning: Goal: Ability to manage health-related needs will improve Outcome: Progressing   Problem: Clinical Measurements: Goal: Ability to maintain clinical measurements within normal limits will improve Outcome: Progressing Goal: Will remain free from infection Outcome: Progressing Goal: Respiratory complications will improve Outcome: Progressing   Problem: Activity: Goal: Risk for activity intolerance will decrease Outcome: Progressing   Problem: Nutrition: Goal: Adequate nutrition will be maintained Outcome: Progressing   Problem: Pain Managment: Goal: General experience of comfort will improve Outcome: Progressing

## 2021-03-17 NOTE — Progress Notes (Signed)
[facility] PROGRESS NOTE    Progress Note  Brandon [name]  GYK:[id number] DOB: 10-23-1955 DOA: 03/07/2021 PCP: [name], MD     Brief Narrative:   Brandon [name] is an 65 y.o. male past medical history of coronary artery disease, chronic systolic heart failure with an AICD extracted on 12/01/2020 due to infection completed antibiotics on 01/15/2021 Rocephin, moderate aortic stenosis right upper extremity DVT comes into the ED for acute onset of dyspnea, was found hypoxic placed on 2 L of oxygen found to be in acute decompensated heart failure, cardiology consulted   Significant Events: 10/29 Co-ox 48%, started on milrinone 0.25.  10/30 IV Antibiotics stopped. Blood cultures no growth for 5 days.  10/31 started on augmentin. Milrinone cut back to 0.125 mcg.  03/12/2021 started on midodrine post was persistently symptomatic. 03/24/2021 started back on milrinone  Antibiotics: 03/07/2021 IV Vanco and cefepime till 03/10/2021 03/11/2021 Augmentin  Microbiology data: 03/07/2021 blood culture: Remain negative till date  Procedures: None  Assessment/Plan:   Acute on chronic respiratory failure with hypoxia ([place]) possibly due to pneumonia/sepsis: High-resolution CT scan showed bronchopneumonia, no LDL. Continue Augmentin for total of 7 days.   Physical therapy recommended no home health PT.  Chronic systolic heart failure: Last 2D echo in July 2022 showed an EF of 20%. Currently on ivabradine, Lasix, digoxin, midodrine and milrinone. His symptoms continue to improve. Nutrition evaluated the patient and recommended core track for nocturnal feeding.  With the patient would like to avoid diet. Is negative about 6 L management per advanced heart failure team.  We will continue support with milrinone in house, probably for L VATS early December may be able to go home with short period of time prior to LVADS.  Dyslipidemia:  Continue statin therapy  Right upper extremity  DVT with neuropathy: Continue apixaban and gabapentin.  Depression: Continue sertraline.  BPH: Continue Flomax.  Hypokalemia: Continue to replete and recheck.  Mild hyponatremia: Likely due to heart failure.   Protein-calorie malnutrition, severe Ensure 3 times daily  Stage I sacral decubitus ulcer present on admission: RN Pressure Injury Documentation: Pressure Injury 12/01/20 Coccyx Medial Stage 1 -  Intact skin with non-blanchable redness of a localized area usually over a bony prominence. (Active)  12/01/20 0800  Location: Coccyx  Location Orientation: Medial  Staging: Stage 1 -  Intact skin with non-blanchable redness of a localized area usually over a bony prominence.  Wound Description (Comments):   Present on Admission:      Pressure Injury 12/02/20 Vertebral column Posterior;Mid blanchable but red (Active)  12/02/20 2000  Location: Vertebral column  Location Orientation: Posterior;Mid  Staging:   Wound Description (Comments): blanchable but red  Present on Admission: No      DVT prophylaxis: lovenox Family Communication:none Status is: Inpatient  Remains inpatient appropriate because: acute severity of liness    Code Status:     Code Status Orders  (From admission, onward)           Start     Ordered   03/07/21 1641  Full code  Continuous        03/07/21 1642           Code Status History     Date Active Date Inactive Code Status Order ID Comments User Context   01/08/2021 1003 01/09/2021 1426 Full Code [id number]  Conrad [place], NP ED   11/29/2020 1709 12/11/2020 2058 Full Code [id number]  Conrad [place], NP Inpatient   11/29/2020  1107 11/29/2020 1709 Full Code [id number]  Jolaine Artist, MD Inpatient   12/31/2019 1107 12/31/2019 2205 Full Code [id number]  Ortencia Kick Inpatient   08/31/2019 1712 09/01/2019 1518 Full Code [id number]  [name], MD Inpatient   04/27/2019 1927 05/04/2019 1953 Full Code [id number]  [name],  MD Inpatient   04/27/2019 1926 04/27/2019 1927 Full Code [id number]  Barrett, Evelene Croon, PA-C Inpatient         IV Access:   Peripheral IV   Procedures and diagnostic studies:   No results found.   Medical Consultants:   None.   Subjective:    [name] he relates he is not having any further orthopnea  Objective:    Vitals:   03/16/21 2319 03/17/21 0308 03/17/21 0613 03/17/21 0738  BP: (!) 92/56 110/70  104/61  Pulse: 91 87  92  Resp: 20 20  (!) 22  Temp: 98.1 F (36.7 C) 98.1 F (36.7 C)  98 F (36.7 C)  TempSrc: Oral Oral  Oral  SpO2: 96% 95%  95%  Weight:   76.7 kg   Height:       SpO2: 95 % O2 Flow Rate (L/min): 2 L/min   Intake/Output Summary (Last 24 hours) at 03/17/2021 0852 Last data filed at 03/17/2021 5397 Gross per 24 hour  Intake 123.3 ml  Output 1625 ml  Net -1501.7 ml    Filed Weights   03/15/21 0500 03/16/21 0630 03/17/21 0613  Weight: 75.5 kg 76 kg 76.7 kg    Exam: General exam: In no acute distress. Respiratory system: Good air movement and clear to auscultation. Cardiovascular system: S1 & S2 heard, RRR. No JVD. Gastrointestinal system: Abdomen is nondistended, soft and nontender.  Extremities: No pedal edema. Skin: No rashes, lesions or ulcers  Data Reviewed:    Labs: Basic Metabolic Panel: Recent Labs  Lab 03/13/21 0621 03/14/21 0400 03/15/21 0415 03/16/21 0320 03/17/21 0503  NA 132* 130* 133* 133* 133*  K 4.2 4.0 3.8 3.9 3.6  CL 99 99 101 101 102  CO2 25 26 27 28 26   GLUCOSE 104* 96 112* 128* 111*  BUN 14 13 13 14 14   CREATININE 0.58* 0.50* 0.58* 0.54* 0.49*  CALCIUM 8.9 8.8* 8.8* 8.9 8.4*  MG 2.0 2.0 2.0 2.0 2.0    GFR Estimated Creatinine Clearance: 97.2 mL/min (A) (by C-G formula based on SCr of 0.49 mg/dL (L)). Liver Function Tests: No results for input(s): AST, ALT, ALKPHOS, BILITOT, PROT, ALBUMIN in the last 168 hours.  No results for input(s): LIPASE, AMYLASE in the last 168 hours. No  results for input(s): AMMONIA in the last 168 hours. Coagulation profile No results for input(s): INR, PROTIME in the last 168 hours. COVID-19 Labs  No results for input(s): DDIMER, FERRITIN, LDH, CRP in the last 72 hours.  Lab Results  Component Value Date   SARSCOV2NAA NEGATIVE 03/07/2021   SARSCOV2NAA NEGATIVE 01/08/2021   [place] NEGATIVE 12/04/2020   [place] NEGATIVE 11/14/2020    CBC: Recent Labs  Lab 03/10/21 0901 03/11/21 1038 03/14/21 0400 03/15/21 0415 03/16/21 0320 03/17/21 0503  WBC 14.2* 12.7* 13.1* 12.0* 11.6* 11.4*  NEUTROABS 12.7* 10.8*  --   --   --   --   HGB 10.6* 10.1* 9.8* 9.1* 9.0* 9.0*  HCT 33.7* 31.8* 30.9* 29.1* 29.2* 29.3*  MCV 78.9* 78.1* 78.2* 78.4* 78.5* 79.0*  PLT 333 292 347 323 328 354    Cardiac Enzymes: No results for input(s): CKTOTAL, CKMB,  CKMBINDEX, TROPONINI in the last 168 hours. BNP (last 3 results) Recent Labs    03/23/20 0922  PROBNP 520*    CBG: No results for input(s): GLUCAP in the last 168 hours. D-Dimer: No results for input(s): DDIMER in the last 72 hours. Hgb A1c: No results for input(s): HGBA1C in the last 72 hours. Lipid Profile: No results for input(s): CHOL, HDL, LDLCALC, TRIG, CHOLHDL, LDLDIRECT in the last 72 hours. Thyroid function studies: No results for input(s): TSH, T4TOTAL, T3FREE, THYROIDAB in the last 72 hours.  Invalid input(s): FREET3 Anemia work up: No results for input(s): VITAMINB12, FOLATE, FERRITIN, TIBC, IRON, RETICCTPCT in the last 72 hours. Sepsis Labs: Recent Labs  Lab 03/14/21 0400 03/15/21 0415 03/16/21 0320 03/17/21 0503  WBC 13.1* 12.0* 11.6* 11.4*    Microbiology Recent Results (from the past 240 hour(s))  Resp Panel by RT-PCR (Flu A&B, Covid) Nasopharyngeal Swab     Status: None   Collection Time: 03/07/21 12:00 PM   Specimen: Nasopharyngeal Swab; Nasopharyngeal(NP) swabs in vial transport medium  Result Value Ref Range Status   SARS Coronavirus 2 by RT PCR  NEGATIVE NEGATIVE Final    Comment: (NOTE) SARS-CoV-2 target nucleic acids are NOT DETECTED.  The SARS-CoV-2 RNA is generally detectable in upper respiratory specimens during the acute phase of infection. The lowest concentration of SARS-CoV-2 viral copies this assay can detect is 138 copies/mL. A negative result does not preclude SARS-Cov-2 infection and should not be used as the sole basis for treatment or other patient management decisions. A negative result may occur with  improper specimen collection/handling, submission of specimen other than nasopharyngeal swab, presence of viral mutation(s) within the areas targeted by this assay, and inadequate number of viral copies(<138 copies/mL). A negative result must be combined with clinical observations, patient history, and epidemiological information. The expected result is Negative.  Fact Sheet for Patients:  EntrepreneurPulse.com.au  Fact Sheet for Healthcare Providers:  IncredibleEmployment.be  This test is no t yet approved or cleared by the [place] FDA and  has been authorized for detection and/or diagnosis of SARS-CoV-2 by FDA under an Emergency Use Authorization (EUA). This EUA will remain  in effect (meaning this test can be used) for the duration of the COVID-19 declaration under Section 564(b)(1) of the Act, 21 U.S.C.section 360bbb-3(b)(1), unless the authorization is terminated  or revoked sooner.       Influenza A by PCR NEGATIVE NEGATIVE Final   Influenza B by PCR NEGATIVE NEGATIVE Final    Comment: (NOTE) The Xpert Xpress SARS-CoV-2/FLU/RSV plus assay is intended as an aid in the diagnosis of influenza from Nasopharyngeal swab specimens and should not be used as a sole basis for treatment. Nasal washings and aspirates are unacceptable for Xpert Xpress SARS-CoV-2/FLU/RSV testing.  Fact Sheet for Patients: EntrepreneurPulse.com.au  Fact Sheet for  Healthcare Providers: IncredibleEmployment.be  This test is not yet approved or cleared by the [place] FDA and has been authorized for detection and/or diagnosis of SARS-CoV-2 by FDA under an Emergency Use Authorization (EUA). This EUA will remain in effect (meaning this test can be used) for the duration of the COVID-19 declaration under Section 564(b)(1) of the Act, 21 U.S.C. section 360bbb-3(b)(1), unless the authorization is terminated or revoked.  Performed at [facility], [place] [address] [place], [place] [address]   Respiratory (~20 pathogens) panel by PCR     Status: None   Collection Time: 03/07/21 12:00 PM   Specimen: Nasopharyngeal Swab; Respiratory  Result Value Ref  Range Status   Adenovirus NOT DETECTED NOT DETECTED Final   Coronavirus 229E NOT DETECTED NOT DETECTED Final    Comment: (NOTE) The Coronavirus on the Respiratory Panel, DOES NOT test for the novel  Coronavirus (2019 nCoV)    Coronavirus HKU1 NOT DETECTED NOT DETECTED Final   Coronavirus NL63 NOT DETECTED NOT DETECTED Final   Coronavirus OC43 NOT DETECTED NOT DETECTED Final   Metapneumovirus NOT DETECTED NOT DETECTED Final   Rhinovirus / Enterovirus NOT DETECTED NOT DETECTED Final   Influenza A NOT DETECTED NOT DETECTED Final   Influenza B NOT DETECTED NOT DETECTED Final   Parainfluenza Virus 1 NOT DETECTED NOT DETECTED Final   Parainfluenza Virus 2 NOT DETECTED NOT DETECTED Final   Parainfluenza Virus 3 NOT DETECTED NOT DETECTED Final   Parainfluenza Virus 4 NOT DETECTED NOT DETECTED Final   Respiratory Syncytial Virus NOT DETECTED NOT DETECTED Final   Bordetella pertussis NOT DETECTED NOT DETECTED Final   Bordetella Parapertussis NOT DETECTED NOT DETECTED Final   Chlamydophila pneumoniae NOT DETECTED NOT DETECTED Final   Mycoplasma pneumoniae NOT DETECTED NOT DETECTED Final    Comment: Performed at [facility], [place] [address] [place], [place] [address]  Blood  culture (routine x 2)     Status: None   Collection Time: 03/07/21 12:54 PM   Specimen: BLOOD LEFT WRIST  Result Value Ref Range Status   Specimen Description BLOOD LEFT WRIST  Final   Special Requests   Final    BOTTLES DRAWN AEROBIC AND ANAEROBIC Blood Culture adequate volume   Culture   Final    NO GROWTH 5 DAYS Performed at [facility], [place] [address] [place], [place] [address]    Report Status 03/12/2021 FINAL  Final  Blood culture (routine x 2)     Status: None   Collection Time: 03/07/21  4:41 PM   Specimen: Site Not Specified; Blood  Result Value Ref Range Status   Specimen Description SITE NOT SPECIFIED  Final   Special Requests   Final    BOTTLES DRAWN AEROBIC AND ANAEROBIC Blood Culture results may not be optimal due to an inadequate volume of blood received in culture bottles   Culture   Final    NO GROWTH 5 DAYS Performed at [facility], [place] [address] [place], [place] [address]    Report Status 03/12/2021 FINAL  Final  [name] Next Gen by PCR, Nasal     Status: None   Collection Time: 03/07/21 10:15 PM   Specimen: Nasal Mucosa; Nasal Swab  Result Value Ref Range Status   [name] by PCR Next Gen NOT DETECTED NOT DETECTED Final    Comment: (NOTE) The GeneXpert [name] Assay (FDA approved for NASAL specimens only), is one component of a comprehensive [name] colonization surveillance program. It is not intended to diagnose [name] infection nor to guide or monitor treatment for [name] infections. Test performance is not FDA approved in patients less than 67 years old. Performed at [facility], [place] [address], [place], [place] [address]   Urine Culture     Status: None   Collection Time: 03/09/21  5:25 PM   Specimen: Urine, Clean Catch  Result Value Ref Range Status   Specimen Description URINE, CLEAN CATCH  Final   Special Requests NONE  Final   Culture   Final    NO GROWTH Performed at [facility], [place] [address] [place],  [address]     Report  Status 03/11/2021 FINAL  Final     Medications:    (feeding supplement) PROSource Plus  30 mL Oral TID BM   amoxicillin-clavulanate  1 tablet Oral Q12H   apixaban  5 mg Oral BID   atorvastatin  80 mg Oral QPM   Chlorhexidine Gluconate Cloth  6 each Topical Daily   digoxin  0.125 mg Oral Daily   diphenhydrAMINE  50 mg Oral QHS   ezetimibe  10 mg Oral Daily   feeding supplement  237 mL Oral BID BM   furosemide  40 mg Oral Daily   gabapentin  200 mg Oral TID   ivabradine  7.5 mg Oral BID WC   lidocaine  1 patch Transdermal Q24H   melatonin  5 mg Oral QHS   midodrine  10 mg Oral TID WC   multivitamin with minerals  1 tablet Oral Daily   pantoprazole  40 mg Oral Daily   potassium chloride  40 mEq Oral Daily   potassium chloride  40 mEq Oral Once   sertraline  50 mg Oral Daily   sodium chloride flush  10-40 mL Intracatheter Q12H   tamsulosin  0.4 mg Oral Daily   Continuous Infusions:  milrinone 0.125 mcg/kg/min (03/17/21 0615)      LOS: 9 days   Charlynne Cousins  [facility]  03/17/2021, 8:52 AM

## 2021-03-17 NOTE — Progress Notes (Signed)
CARDIAC REHAB PHASE I   Went to offer to walk with pt. Pt states recent ambulation with mobility tech. Pt states he feels good today and is appreciative to the supportive care he has been receiving. Pt still hesitant about meeting a VAD pt. Provided support and encouragement. Encouraged continued ambulation and nutrition. Will continue to follow.  [phone] Rufina Falco, RN BSN 03/17/2021 11:30 AM

## 2021-03-17 NOTE — Progress Notes (Signed)
Mobility Specialist Progress Note:   03/17/21 0957  Mobility  Activity Ambulated in hall  Level of Assistance Standby assist, set-up cues, supervision of patient - no hands on  Assistive Device None (IV pole)  Distance Ambulated (ft) 340 ft  Mobility Ambulated with assistance in hallway  Mobility Response Tolerated well  Mobility performed by Mobility specialist  $Mobility charge 1 Mobility   Pre- Mobility:  95 HR; 94% SpO2 During Mobility: 106 HR; 91% SpO2 Post Mobility:  93 HR; 93% SpO2  Pt received in bed willing to participate in mobility. A little SOB during ambulation. Pt returned to bed with call bell in reach and all needs met.   [facility] Health and safety inspector Phone [phone]

## 2021-03-18 ENCOUNTER — Inpatient Hospital Stay (HOSPITAL_COMMUNITY): Payer: Medicare Other

## 2021-03-18 DIAGNOSIS — J9621 Acute and chronic respiratory failure with hypoxia: Secondary | ICD-10-CM | POA: Diagnosis not present

## 2021-03-18 LAB — BASIC METABOLIC PANEL
Anion gap: 8 (ref 5–15)
BUN: 16 mg/dL (ref 8–23)
CO2: 29 mmol/L (ref 22–32)
Calcium: 8.9 mg/dL (ref 8.9–10.3)
Chloride: 98 mmol/L (ref 98–111)
Creatinine, Ser: 0.56 mg/dL — ABNORMAL LOW (ref 0.61–1.24)
GFR, Estimated: >60 mL/min (ref >60)
Glucose, Bld: 109 mg/dL — ABNORMAL HIGH (ref 70–99)
Potassium: 3.6 mmol/L (ref 3.5–5.1)
Sodium: 135 mmol/L (ref 135–145)

## 2021-03-18 LAB — COOXEMETRY PANEL
Carboxyhemoglobin: 1.6 % — ABNORMAL HIGH (ref 0.5–1.5)
Methemoglobin: 0.5 % (ref 0.0–1.5)
O2 Saturation: 63.3 %
Total hemoglobin: 10.8 g/dL — ABNORMAL LOW (ref 12.0–16.0)

## 2021-03-18 LAB — CBC
HCT: 29.6 % — ABNORMAL LOW (ref 39.0–52.0)
Hemoglobin: 9.1 g/dL — ABNORMAL LOW (ref 13.0–17.0)
MCH: 24.4 pg — ABNORMAL LOW (ref 26.0–34.0)
MCHC: 30.7 g/dL (ref 30.0–36.0)
MCV: 79.4 fL — ABNORMAL LOW (ref 80.0–100.0)
Platelets: 349 10*3/uL (ref 150–400)
RBC: 3.73 MIL/uL — ABNORMAL LOW (ref 4.22–5.81)
RDW: 17.4 % — ABNORMAL HIGH (ref 11.5–15.5)
WBC: 10.4 10*3/uL (ref 4.0–10.5)
nRBC: 0 % (ref 0.0–0.2)

## 2021-03-18 LAB — MAGNESIUM: Magnesium: 2 mg/dL (ref 1.7–2.4)

## 2021-03-18 MED ORDER — POTASSIUM CHLORIDE CRYS ER 20 MEQ PO TBCR
40.0000 meq | EXTENDED_RELEASE_TABLET | Freq: Once | ORAL | Status: AC
  Administered 2021-03-18: 40 meq via ORAL
  Filled 2021-03-18: qty 2

## 2021-03-18 NOTE — Progress Notes (Signed)
Patient ID: Brandon [name], male   DOB: 1955-11-05, 65 y.o.   MRN: [id number]     Advanced Heart Failure Rounding Note  PCP-Cardiologist: Brandon Ruths, MD   Subjective:    Admitted with sepsis. Started on cefepime + vanc, now off abx (ID following). Afebrile.  10/27 started on ivabradine 7.5 mg twice a day and midodrine 5 mg TID 10/29 Co-ox 48%, started on milrinone 0.25.  10/30 IV Antibiotics stopped. Blood cultures no growth for 5 days.  10/31 started on Augmentin for PNA.  11/1 Milrinone off 11/2 Milrinone added back. Midodrine increased to 10 mg TID  Remains on 0.125. CO-OX 63%.   Received 1 dose of IV lasix yesterday. I/Os incomplete. Weight down about 1/2 pound.   Breathing better. No orthopnea or PND. Slept well. Continues to push his caloric intake.   Objective:   Weight Range: 76.4 kg Body mass index is 22.84 kg/m.   Vital Signs:   Temp:  [97.3 F (36.3 C)-98.3 F (36.8 C)] 98.1 F (36.7 C) (11/06 0752) Pulse Rate:  [79-93] 93 (11/06 0943) Resp:  [16-28] 19 (11/06 0752) BP: (93-105)/(56-66) 101/59 (11/06 0752) SpO2:  [94 %-97 %] 95 % (11/06 0752) Weight:  [76.4 kg] 76.4 kg (11/06 0557) Last BM Date: 03/17/21  Weight change: Filed Weights   03/16/21 0630 03/17/21 0613 03/18/21 0557  Weight: 76 kg 76.7 kg 76.4 kg    Intake/Output:   Intake/Output Summary (Last 24 hours) at 03/18/2021 0953 Last data filed at 03/17/2021 1249 Gross per 24 hour  Intake 10 ml  Output 400 ml  Net -390 ml      Physical Exam   General:  Sitting in chair . No resp difficulty HEENT: normal Neck: supple. no JVD. Carotids 2+ bilat; no bruits. No lymphadenopathy or thryomegaly appreciated. Cor: PMI nondisplaced. Regular rate & rhythm.2/6 SEM RUSB Lungs: clear Abdomen: soft, nontender, nondistended. No hepatosplenomegaly. No bruits or masses. Good bowel sounds. Extremities: no cyanosis, clubbing, rash, edema Neuro: alert & orientedx3, cranial nerves grossly intact. moves all 4  extremities w/o difficulty. Affect pleasant   Telemetry   NSR 80-90s Personally reviewed  Labs    CBC Recent Labs    03/17/21 0503 03/18/21 0538  WBC 11.4* 10.4  HGB 9.0* 9.1*  HCT 29.3* 29.6*  MCV 79.0* 79.4*  PLT 354 962    Basic Metabolic Panel Recent Labs    03/17/21 0503 03/18/21 0538  NA 133* 135  K 3.6 3.6  CL 102 98  CO2 26 29  GLUCOSE 111* 109*  BUN 14 16  CREATININE 0.49* 0.56*  CALCIUM 8.4* 8.9  MG 2.0 2.0    Liver Function Tests No results for input(s): AST, ALT, ALKPHOS, BILITOT, PROT, ALBUMIN in the last 72 hours.  No results for input(s): LIPASE, AMYLASE in the last 72 hours. Cardiac Enzymes No results for input(s): CKTOTAL, CKMB, CKMBINDEX, TROPONINI in the last 72 hours.  BNP: BNP (last 3 results) Recent Labs    11/30/20 0144 01/08/21 0731 01/18/21 1629  BNP 2,653.1* 734.2* 607.9*     ProBNP (last 3 results) Recent Labs    03/23/20 0922  PROBNP 520*      D-Dimer No results for input(s): DDIMER in the last 72 hours. Hemoglobin A1C No results for input(s): HGBA1C in the last 72 hours. Fasting Lipid Panel No results for input(s): CHOL, HDL, LDLCALC, TRIG, CHOLHDL, LDLDIRECT in the last 72 hours. Thyroid Function Tests No results for input(s): TSH, T4TOTAL, T3FREE, THYROIDAB in the last 72  hours.  Invalid input(s): FREET3  Other results:   Imaging    No results found.   Medications:     Scheduled Medications:  (feeding supplement) PROSource Plus  30 mL Oral TID BM   amoxicillin-clavulanate  1 tablet Oral Q12H   apixaban  5 mg Oral BID   atorvastatin  80 mg Oral QPM   Chlorhexidine Gluconate Cloth  6 each Topical Daily   digoxin  0.125 mg Oral Daily   diphenhydrAMINE  50 mg Oral QHS   ezetimibe  10 mg Oral Daily   feeding supplement  237 mL Oral BID BM   furosemide  40 mg Oral Daily   gabapentin  200 mg Oral TID   ivabradine  7.5 mg Oral BID WC   lidocaine  1 patch Transdermal Q24H   melatonin  5 mg Oral  QHS   midodrine  10 mg Oral TID WC   multivitamin with minerals  1 tablet Oral Daily   pantoprazole  40 mg Oral Daily   potassium chloride  40 mEq Oral Daily   potassium chloride  40 mEq Oral Once   sertraline  50 mg Oral Daily   sodium chloride flush  10-40 mL Intracatheter Q12H   tamsulosin  0.4 mg Oral Daily    Infusions:  milrinone 0.125 mcg/kg/min (03/17/21 0615)     PRN Medications: acetaminophen **OR** acetaminophen, guaiFENesin, ondansetron **OR** ondansetron (ZOFRAN) IV, sodium chloride flush    Patient Profile   [name] is a 65 year old with history of smoker, CAD s/p previous MI, HTN, HL, chronic HFeEF, sepsis 11/2020 bacteremia.  Admitted with sepsis. Bld Cx - NGTD    Assessment/Plan   Sepsis - H/o strep bacteremia 11/2020.  ICD extracted 12/04/20. Barostim was not removed as it is extravascular.  - Had been on IV antibiotics until 01/15/21. Blood cultures negative 01/30/21. - WBCs 11.4 Afebrile.  - Procalcitonin 0.22->0.11 - 10/26 -Blood CX x2  No growth 5 days.  - Ct chest concerning for pneumonia.  ?PNA => initially on cefepime/vancomycin, now off both.   - Started Augmentin 10/31. ID has seen. - No change  2. Acute Hypoxic Respiratory Failure  - CXR with pulmonary edema initially, and was diuresed with IV Lasix.  -  CT chest completed 10/30 concerning for bronchopneumonia. On augmentin.  - Will repeat CXR today    3. Chronic Systolic HFr EF - Echo 06/17/07 EF 20-25% RV mildly HK. moderate AS  Mean gradient 13 AVA 1.2 cm2 DI 0.30 - RHC (7/22): EF 20%, low filling pressures. CI 2.3.  - 11/2 Restarted 0.125 milrinone  - Co-ox 63% today. Symptomatically improved - CVP low/stable. Continue lasix 40 mg po daily.   - No bb with low output HF - Continue ivabradine 7.5 mg twice a day.  - Continue digoxin. Dig level ok (0.3)  - Continue midodrine 10 mg TID   - Discussed with [name] -  not a candidate for transplant.  - VAD w/u underway. Will try to support in  house with milrinone, PT/OT and adequate nutrition with eye toward VAD in early December. May be able to go home for a short perid of time prior to VAD if he is improving.    4. CAD  s/p anterior STEMI (12/20). LHC showed chronically occluded RCA (with L>>R collaterals) and thrombotic occlusion of proximal LAD. Underwent PCI of LAD. - LHC (7/22) with stable CAD.  - No s/s angina  - Continue atorvastatin 80. No aspirin d/t anticoagulation.  5. 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 H./O RUE DVT - On eliquis. Will need to change to heparin as we get closer to VAD  7. Severe protein-calorie malnutrition - prealbumin 11 - Started on oral supplement.  - calorie count underway - repeat prealbumin in am   8. Microcytic anemia - MCV 79. Check iron stores  Length of Stay: [place], [place]  03/18/2021, 9:53 AM  Advanced Heart Failure Team Pager [phone] (M-F; 7a - 5p)  Please contact [facility] for night-coverage after hours (5p -7a ) and weekends on [email or web address]

## 2021-03-18 NOTE — Progress Notes (Signed)
Mobility Specialist Progress Note:   03/18/21 1410  Mobility  Activity Ambulated in hall  Level of Assistance Standby assist, set-up cues, supervision of patient - no hands on  Assistive Device None  Distance Ambulated (ft) 360 ft  Mobility Ambulated with assistance in hallway  Mobility Response Tolerated well  Mobility performed by Mobility specialist  $Mobility charge 1 Mobility   Pt received in bed willing to participate in mobility. Complaints of being a little SOB on 3L O2 required a seated rest break, SpO2 reading 89% so bumped up to 4L O2. SpO2 increased to 93% and remained above that the rest of ambulation. Pt returned to bed with call bell in reach and all needs met.   [facility] Health and safety inspector Phone [phone]

## 2021-03-18 NOTE — Progress Notes (Signed)
[facility] PROGRESS NOTE    Progress Note  Brandon [name]  ZSW:[id number] DOB: 1955-12-25 DOA: 03/07/2021 PCP: [name], MD     Brief Narrative:   Brandon [name] is an 65 y.o. male past medical history of coronary artery disease, chronic systolic heart failure with an AICD extracted on 12/01/2020 due to infection completed antibiotics on 01/15/2021 Rocephin, moderate aortic stenosis right upper extremity DVT comes into the ED for acute onset of dyspnea, was found hypoxic placed on 2 L of oxygen found to be in acute decompensated heart failure, cardiology consulted   Significant Events: 10/29 Co-ox 48%, started on milrinone 0.25.  10/30 IV Antibiotics stopped. Blood cultures no growth for 5 days.  10/31 started on augmentin. Milrinone cut back to 0.125 mcg.  03/12/2021 started on midodrine post was persistently symptomatic. 03/24/2021 started back on milrinone  Antibiotics: 03/07/2021 IV Vanco and cefepime till 03/10/2021 03/11/2021 Augmentin  Microbiology data: 03/07/2021 blood culture: Remain negative till date  Procedures: None  Assessment/Plan:   Acute on chronic respiratory failure with hypoxia ([place]) possibly due to pneumonia/sepsis: High-resolution CT scan showed bronchopneumonia, no LDL. Continue Augmentin for total of 7 days.   Physical therapy recommended no home health PT.  Chronic systolic heart failure: Last 2D echo in July 2022 showed an EF of 20%. Currently on ivabradine, Lasix, digoxin, midodrine and milrinone. His symptoms continue to improve. Nutrition evaluated the patient and recommended core track for nocturnal feeding.  With the patient would like to avoid diet. Is negative about 6 L management per advanced heart failure team.  We will continue support with milrinone in house, probably for L VATS early December may be able to go home with short period of time prior to LVADS. Continue calorie count and oral supplementation.  Dyslipidemia:   Continue statin therapy  Right upper extremity DVT with neuropathy: Continue apixaban and gabapentin.  Depression: Continue sertraline.  BPH: Continue Flomax.  Hypokalemia: Continue to replete and recheck.  Mild hyponatremia: Likely due to heart failure.   Protein-calorie malnutrition, severe Ensure 3 times daily  Severe low-flow aortic stenosis: Not a candidate for TAVR was due to the presence of fibroblastoma, pending VAD work up.  Stage I sacral decubitus ulcer present on admission: RN Pressure Injury Documentation: Pressure Injury 12/01/20 Coccyx Medial Stage 1 -  Intact skin with non-blanchable redness of a localized area usually over a bony prominence. (Active)  12/01/20 0800  Location: Coccyx  Location Orientation: Medial  Staging: Stage 1 -  Intact skin with non-blanchable redness of a localized area usually over a bony prominence.  Wound Description (Comments):   Present on Admission:      Pressure Injury 12/02/20 Vertebral column Posterior;Mid blanchable but red (Active)  12/02/20 2000  Location: Vertebral column  Location Orientation: Posterior;Mid  Staging:   Wound Description (Comments): blanchable but red  Present on Admission: No      DVT prophylaxis: lovenox Family Communication:none Status is: Inpatient  Remains inpatient appropriate because: acute severity of liness    Code Status:     Code Status Orders  (From admission, onward)           Start     Ordered   03/07/21 1641  Full code  Continuous        03/07/21 1642           Code Status History     Date Active Date Inactive Code Status Order ID Comments User Context   01/08/2021 1003 01/09/2021 1426  Full Code [id number]  Conrad [place], NP ED   11/29/2020 1709 12/11/2020 2058 Full Code [id number]  Conrad Vincent, NP Inpatient   11/29/2020 1107 11/29/2020 1709 Full Code [id number]  Jolaine Artist, MD Inpatient   12/31/2019 1107 12/31/2019 2205 Full Code [id number]  Ortencia Kick Inpatient   08/31/2019 1712 09/01/2019 1518 Full Code [id number]  [name], MD Inpatient   04/27/2019 1927 05/04/2019 1953 Full Code [id number]  [name], MD Inpatient   04/27/2019 1926 04/27/2019 1927 Full Code [id number]  Barrett, Evelene Croon, PA-C Inpatient         IV Access:   Peripheral IV   Procedures and diagnostic studies:   No results found.   Medical Consultants:   None.   Subjective:    Brandon [name] having good night sleep able to sleep flat tolerating all his diet.  Objective:    Vitals:   03/17/21 2319 03/18/21 0325 03/18/21 0557 03/18/21 0752  BP: 105/65 103/66  (!) 101/59  Pulse: 81 79  91  Resp: (!) 28 20  19   Temp: 98.3 F (36.8 C) (!) 97.3 F (36.3 C)  98.1 F (36.7 C)  TempSrc: Oral Oral  Oral  SpO2: 95% 94%  95%  Weight:   76.4 kg   Height:       SpO2: 95 % O2 Flow Rate (L/min): 2 L/min   Intake/Output Summary (Last 24 hours) at 03/18/2021 0847 Last data filed at 03/17/2021 1249 Gross per 24 hour  Intake 10 ml  Output 400 ml  Net -390 ml    Filed Weights   03/16/21 0630 03/17/21 0613 03/18/21 0557  Weight: 76 kg 76.7 kg 76.4 kg    Exam: General exam: In no acute distress. Respiratory system: Good air movement and clear to auscultation. Cardiovascular system: S1 & S2 heard, RRR. No JVD. Gastrointestinal system: Abdomen is nondistended, soft and nontender.  Extremities: No pedal edema. Skin: No rashes, lesions or ulcers  Data Reviewed:    Labs: Basic Metabolic Panel: Recent Labs  Lab 03/14/21 0400 03/15/21 0415 03/16/21 0320 03/17/21 0503 03/18/21 0538  NA 130* 133* 133* 133* 135  K 4.0 3.8 3.9 3.6 3.6  CL 99 101 101 102 98  CO2 26 27 28 26 29   GLUCOSE 96 112* 128* 111* 109*  BUN 13 13 14 14 16   CREATININE 0.50* 0.58* 0.54* 0.49* 0.56*  CALCIUM 8.8* 8.8* 8.9 8.4* 8.9  MG 2.0 2.0 2.0 2.0 2.0    GFR Estimated Creatinine Clearance: 96.8 mL/min (A) (by C-G formula based on SCr of 0.56 mg/dL  (L)). Liver Function Tests: No results for input(s): AST, ALT, ALKPHOS, BILITOT, PROT, ALBUMIN in the last 168 hours.  No results for input(s): LIPASE, AMYLASE in the last 168 hours. No results for input(s): AMMONIA in the last 168 hours. Coagulation profile No results for input(s): INR, PROTIME in the last 168 hours. COVID-19 Labs  No results for input(s): DDIMER, FERRITIN, LDH, CRP in the last 72 hours.  Lab Results  Component Value Date   SARSCOV2NAA NEGATIVE 03/07/2021   SARSCOV2NAA NEGATIVE 01/08/2021   SARSCOV2NAA NEGATIVE 12/04/2020   [place] NEGATIVE 11/14/2020    CBC: Recent Labs  Lab 03/11/21 1038 03/14/21 0400 03/15/21 0415 03/16/21 0320 03/17/21 0503 03/18/21 0538  WBC 12.7* 13.1* 12.0* 11.6* 11.4* 10.4  NEUTROABS 10.8*  --   --   --   --   --   HGB 10.1* 9.8* 9.1* 9.0* 9.0* 9.1*  HCT  31.8* 30.9* 29.1* 29.2* 29.3* 29.6*  MCV 78.1* 78.2* 78.4* 78.5* 79.0* 79.4*  PLT 292 347 323 328 354 349    Cardiac Enzymes: No results for input(s): CKTOTAL, CKMB, CKMBINDEX, TROPONINI in the last 168 hours. BNP (last 3 results) Recent Labs    03/23/20 0922  PROBNP 520*    CBG: No results for input(s): GLUCAP in the last 168 hours. D-Dimer: No results for input(s): DDIMER in the last 72 hours. Hgb A1c: No results for input(s): HGBA1C in the last 72 hours. Lipid Profile: No results for input(s): CHOL, HDL, LDLCALC, TRIG, CHOLHDL, LDLDIRECT in the last 72 hours. Thyroid function studies: No results for input(s): TSH, T4TOTAL, T3FREE, THYROIDAB in the last 72 hours.  Invalid input(s): FREET3 Anemia work up: No results for input(s): VITAMINB12, FOLATE, FERRITIN, TIBC, IRON, RETICCTPCT in the last 72 hours. Sepsis Labs: Recent Labs  Lab 03/15/21 0415 03/16/21 0320 03/17/21 0503 03/18/21 0538  WBC 12.0* 11.6* 11.4* 10.4    Microbiology Recent Results (from the past 240 hour(s))  Urine Culture     Status: None   Collection Time: 03/09/21  5:25 PM    Specimen: Urine, Clean Catch  Result Value Ref Range Status   Specimen Description URINE, CLEAN CATCH  Final   Special Requests NONE  Final   Culture   Final    NO GROWTH Performed at [facility], [address] [place], [place] [address]    Report Status 03/11/2021 FINAL  Final     Medications:    (feeding supplement) PROSource Plus  30 mL Oral TID BM   amoxicillin-clavulanate  1 tablet Oral Q12H   apixaban  5 mg Oral BID   atorvastatin  80 mg Oral QPM   Chlorhexidine Gluconate Cloth  6 each Topical Daily   digoxin  0.125 mg Oral Daily   diphenhydrAMINE  50 mg Oral QHS   ezetimibe  10 mg Oral Daily   feeding supplement  237 mL Oral BID BM   furosemide  40 mg Oral Daily   gabapentin  200 mg Oral TID   ivabradine  7.5 mg Oral BID WC   lidocaine  1 patch Transdermal Q24H   melatonin  5 mg Oral QHS   midodrine  10 mg Oral TID WC   multivitamin with minerals  1 tablet Oral Daily   pantoprazole  40 mg Oral Daily   potassium chloride  40 mEq Oral Daily   potassium chloride  40 mEq Oral Once   sertraline  50 mg Oral Daily   sodium chloride flush  10-40 mL Intracatheter Q12H   tamsulosin  0.4 mg Oral Daily   Continuous Infusions:  milrinone 0.125 mcg/kg/min (03/17/21 0615)      LOS: 10 days   Charlynne Cousins  [facility]  03/18/2021, 8:47 AM

## 2021-03-19 DIAGNOSIS — J9621 Acute and chronic respiratory failure with hypoxia: Secondary | ICD-10-CM | POA: Diagnosis not present

## 2021-03-19 LAB — IRON AND TIBC
Iron: 30 ug/dL — ABNORMAL LOW (ref 45–182)
Saturation Ratios: 13 % — ABNORMAL LOW (ref 17.9–39.5)
TIBC: 231 ug/dL — ABNORMAL LOW (ref 250–450)
UIBC: 201 ug/dL

## 2021-03-19 LAB — BASIC METABOLIC PANEL
Anion gap: 7 (ref 5–15)
BUN: 17 mg/dL (ref 8–23)
CO2: 28 mmol/L (ref 22–32)
Calcium: 8.8 mg/dL — ABNORMAL LOW (ref 8.9–10.3)
Chloride: 99 mmol/L (ref 98–111)
Creatinine, Ser: 0.59 mg/dL — ABNORMAL LOW (ref 0.61–1.24)
GFR, Estimated: >60 mL/min (ref >60)
Glucose, Bld: 116 mg/dL — ABNORMAL HIGH (ref 70–99)
Potassium: 3.9 mmol/L (ref 3.5–5.1)
Sodium: 134 mmol/L — ABNORMAL LOW (ref 135–145)

## 2021-03-19 LAB — COOXEMETRY PANEL
Carboxyhemoglobin: 1.8 % — ABNORMAL HIGH (ref 0.5–1.5)
Methemoglobin: 0.7 % (ref 0.0–1.5)
O2 Saturation: 61.6 %
Total hemoglobin: 8.4 g/dL — ABNORMAL LOW (ref 12.0–16.0)

## 2021-03-19 LAB — MAGNESIUM: Magnesium: 2.1 mg/dL (ref 1.7–2.4)

## 2021-03-19 LAB — PREALBUMIN: Prealbumin: 15.2 mg/dL — ABNORMAL LOW (ref 18–38)

## 2021-03-19 MED ORDER — FUROSEMIDE 10 MG/ML IJ SOLN
40.0000 mg | Freq: Once | INTRAMUSCULAR | Status: AC
  Administered 2021-03-19: 40 mg via INTRAVENOUS
  Filled 2021-03-19: qty 4

## 2021-03-19 MED ORDER — FUROSEMIDE 40 MG PO TABS
80.0000 mg | ORAL_TABLET | Freq: Every day | ORAL | Status: DC
  Administered 2021-03-20 – 2021-03-21 (×2): 80 mg via ORAL
  Filled 2021-03-19 (×2): qty 2

## 2021-03-19 MED ORDER — SODIUM CHLORIDE 0.9 % IV SOLN
250.0000 mg | Freq: Every day | INTRAVENOUS | Status: DC
  Filled 2021-03-19: qty 20

## 2021-03-19 NOTE — Progress Notes (Addendum)
Patient ID: Brandon [name], male   DOB: 04/30/1956, 65 y.o.   MRN: [id number]     Advanced Heart Failure Rounding Note  PCP-Cardiologist: Brandon Ruths, MD   Subjective:    Admitted with sepsis. Started on cefepime + vanc, now off abx (ID following). Afebrile.  10/27 started on ivabradine 7.5 mg twice a day and midodrine 5 mg TID 10/29 Co-ox 48%, started on milrinone 0.25.  10/30 IV Antibiotics stopped. Blood cultures no growth for 5 days.  10/31 started on Augmentin for PNA.  11/1 Milrinone off 11/2 Milrinone added back. Midodrine increased to 10 mg TID  Remains on 0.125. CO-OX 62%.   Prealbumin trending up 11>15   Complaining shortness of breath. Worse today.Had difficulty walking.,    Objective:   Weight Range: 77.2 kg Body mass index is 23.08 kg/m.   Vital Signs:   Temp:  [97.6 F (36.4 C)-98.3 F (36.8 C)] 97.6 F (36.4 C) (11/07 0727) Pulse Rate:  [86-94] 94 (11/07 0727) Resp:  [19-28] 21 (11/07 0727) BP: (96-107)/(63-71) 107/71 (11/07 0323) SpO2:  [92 %-96 %] 95 % (11/07 0727) Weight:  [77.2 kg] 77.2 kg (11/07 0323) Last BM Date: 03/17/21  Weight change: Filed Weights   03/17/21 0613 03/18/21 0557 03/19/21 0323  Weight: 76.7 kg 76.4 kg 77.2 kg    Intake/Output:   Intake/Output Summary (Last 24 hours) at 03/19/2021 0759 Last data filed at 03/19/2021 0000 Gross per 24 hour  Intake 143.58 ml  Output 950 ml  Net -806.42 ml     Physical Exam  CVP 5  General:  Dyspneic sitting up in the bed.  HEENT: normal Neck: supple. JVP 6-7. Carotids 2+ bilat; no bruits. No lymphadenopathy or thryomegaly appreciated. Cor: PMI nondisplaced. Regular rate & rhythm. No rubs, gallops or murmurs. Lungs: Crackles in the bases. On 3 liters [place]  Abdomen: soft, nontender, nondistended. No hepatosplenomegaly. No bruits or masses. Good bowel sounds. Extremities: no cyanosis, clubbing, rash, edema. LUE PICC Neuro: alert & orientedx3, cranial nerves grossly intact. moves all 4  extremities w/o difficulty. Affect pleasant   Telemetry  SR 80-90s personally reviewed.   Labs    CBC Recent Labs    03/17/21 0503 03/18/21 0538  WBC 11.4* 10.4  HGB 9.0* 9.1*  HCT 29.3* 29.6*  MCV 79.0* 79.4*  PLT 354 782   Basic Metabolic Panel Recent Labs    03/18/21 0538 03/19/21 0510  NA 135 134*  K 3.6 3.9  CL 98 99  CO2 29 28  GLUCOSE 109* 116*  BUN 16 17  CREATININE 0.56* 0.59*  CALCIUM 8.9 8.8*  MG 2.0 2.1   Liver Function Tests No results for input(s): AST, ALT, ALKPHOS, BILITOT, PROT, ALBUMIN in the last 72 hours.  No results for input(s): LIPASE, AMYLASE in the last 72 hours. Cardiac Enzymes No results for input(s): CKTOTAL, CKMB, CKMBINDEX, TROPONINI in the last 72 hours.  BNP: BNP (last 3 results) Recent Labs    11/30/20 0144 01/08/21 0731 01/18/21 1629  BNP 2,653.1* 734.2* 607.9*    ProBNP (last 3 results) Recent Labs    03/23/20 0922  PROBNP 520*     D-Dimer No results for input(s): DDIMER in the last 72 hours. Hemoglobin A1C No results for input(s): HGBA1C in the last 72 hours. Fasting Lipid Panel No results for input(s): CHOL, HDL, LDLCALC, TRIG, CHOLHDL, LDLDIRECT in the last 72 hours. Thyroid Function Tests No results for input(s): TSH, T4TOTAL, T3FREE, THYROIDAB in the last 72 hours.  Invalid input(s): FREET3  Other results:   Imaging    DG Chest 2 View  Result Date: 03/18/2021 CLINICAL DATA:  CHF and shortness of breath. EXAM: CHEST - 2 VIEW COMPARISON:  Radiograph and chest CT 03/11/2021. FINDINGS: Left upper extremity PICC tip in the SVC. Stable heart size and mediastinal contours. Bilateral pleural effusions and hazy lung base opacities. Generalized worsening of bilateral upper lung zone patchy opacities. There is septal thickening consistent with pulmonary edema. No pneumothorax. Battery pack on the right with lead coursing into the neck unchanged. IMPRESSION: 1. Moderate bilateral pleural effusions. Mild  pulmonary edema. 2. There is worsening of bilateral patchy suprahilar opacities, suspicious for superimposed pneumonia. Electronically Signed   By: Keith Rake M.D.   On: 03/18/2021 15:42     Medications:     Scheduled Medications:  (feeding supplement) PROSource Plus  30 mL Oral TID BM   amoxicillin-clavulanate  1 tablet Oral Q12H   apixaban  5 mg Oral BID   atorvastatin  80 mg Oral QPM   Chlorhexidine Gluconate Cloth  6 each Topical Daily   digoxin  0.125 mg Oral Daily   diphenhydrAMINE  50 mg Oral QHS   ezetimibe  10 mg Oral Daily   feeding supplement  237 mL Oral BID BM   furosemide  40 mg Oral Daily   gabapentin  200 mg Oral TID   ivabradine  7.5 mg Oral BID WC   lidocaine  1 patch Transdermal Q24H   melatonin  5 mg Oral QHS   midodrine  10 mg Oral TID WC   multivitamin with minerals  1 tablet Oral Daily   pantoprazole  40 mg Oral Daily   potassium chloride  40 mEq Oral Daily   sertraline  50 mg Oral Daily   sodium chloride flush  10-40 mL Intracatheter Q12H   tamsulosin  0.4 mg Oral Daily    Infusions:  milrinone 0.125 mcg/kg/min (03/19/21 0000)     PRN Medications: acetaminophen **OR** acetaminophen, guaiFENesin, ondansetron **OR** ondansetron (ZOFRAN) IV, sodium chloride flush    Patient Profile   [name] is a 65 year old with history of smoker, CAD s/p previous MI, HTN, HL, chronic HFeEF, sepsis 11/2020 bacteremia.  Admitted with sepsis. Bld Cx - NGTD    Assessment/Plan   Sepsis - H/o strep bacteremia 11/2020.  ICD extracted 12/04/20. Barostim was not removed as it is extravascular.  - Had been on IV antibiotics until 01/15/21. Blood cultures negative 01/30/21. - WBCs 11.4 Afebrile.  - Procalcitonin 0.22->0.11 - 10/26 -Blood CX x2  No growth 5 days.  - Ct chest concerning for pneumonia.  ?PNA => initially on cefepime/vancomycin, now off both.   WBC down to 10 today.  - Last dose Augmentin today.  - ID has seen. - No change  2. Acute Hypoxic  Respiratory Failure  - CXR with pulmonary edema initially, and was diuresed with IV Lasix.  -  CT chest completed 10/30 concerning for bronchopneumonia. On augmentin.  - CXR concerning for pulmonary edema versus pneumonia.  - Given 40 mg IV lasix now.   3. Chronic Systolic HFr EF - Echo 06/22/91 EF 20-25% RV mildly HK. moderate AS  Mean gradient 13 AVA 1.2 cm2 DI 0.30 - RHC (7/22): EF 20%, low filling pressures. CI 2.3.  - 11/2 Restarted 0.125 milrinone  - Co-ox 62% today. Symptomatically worse today .  -CVP 5. Stop  po lasix. Give one time dose of IV lasix to see if this improves respiratory status.    -  No bb with low output HF - Continue ivabradine 7.5 mg twice a day.  - Continue digoxin. Dig level ok (0.3)  - Continue midodrine 10 mg TID   - Renal function stable.  - Discussed with [name] -  not a candidate for transplant.  - VAD w/u underway. Will try to support in house with milrinone, PT/OT and adequate nutrition with eye toward VAD in early December. May be able to go home for a short perid of time prior to VAD if he is improving.    4. CAD  s/p anterior STEMI (12/20). LHC showed chronically occluded RCA (with L>>R collaterals) and thrombotic occlusion of proximal LAD. Underwent PCI of LAD. - LHC (7/22) with stable CAD.  - No s/s angina  - Continue atorvastatin 80. No aspirin d/t anticoagulation.  5. 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 H./O RUE DVT - On eliquis. Will need to change to heparin as we get closer to VAD  7. Severe protein-calorie malnutrition - prealbumin trending up 11>15.  - Started on oral supplement.  - calorie count underway  8. Microcytic anemia - MCV 79.  - Iron Sats 13%.  - Considered IV Iron but want make sure clear of infection.    Length of Stay: [place], NP  03/19/2021, 7:59 AM  Advanced Heart Failure Team Pager [phone] (M-F; 7a - 5p)  Please contact [facility] for night-coverage after hours (5p -7a ) and weekends on [email or web address]  Patient seen and examined with the above-signed Advanced Practice Provider and/or Housestaff. I personally reviewed laboratory data, imaging studies and relevant notes. I independently examined the patient and formulated the important aspects of the plan. I have edited the note to reflect any of my changes or salient points. I have personally discussed the plan with the patient and/or family.  More SOB and orthopneic this am despite CVP 5. Received IV lasix and has diuresed well. Remains on milrinone Co-ox 62%. Symptoms improved. Prealbumin 11 -> 15  General:  Sitting up in chair No resp difficulty HEENT: normal Neck: supple. no JVD. Carotids 2+ bilat; no bruits. No lymphadenopathy or thryomegaly appreciated. Cor: PMI nondisplaced. Regular rate & rhythm. No rubs, gallops or murmurs. Lungs: crackles in bases Abdomen: soft, nontender, nondistended. No hepatosplenomegaly. No bruits or masses. Good bowel sounds. Extremities: no cyanosis, clubbing, rash, edema Neuro: alert & orientedx3, cranial nerves grossly intact. moves all 4 extremities w/o difficulty. Affect pleasant  He is volume overloaded. Will give 2 doses of IV lasix today and increase po lasix to 80 daily tomorrow. Continue milrinone.  Nutrition improving. Continue to work toward VAD.   Glori Bickers, MD  1:19 PM

## 2021-03-19 NOTE — Progress Notes (Signed)
Mobility Specialist: Progress Note   03/19/21 1802  Mobility  Activity Ambulated in hall  Level of Assistance Modified independent, requires aide device or extra time  Assistive Device  (IV Pole)  Distance Ambulated (ft) 240 ft  Mobility Ambulated with assistance in hallway  Mobility Response Tolerated well  Mobility performed by Mobility specialist  $Mobility charge 1 Mobility   Pre-Mobility: 89 HR, 91% SpO2 During Mobility: 94% SpO2 Post-Mobility: 83 HR, 94% SpO2  Pt required minA to sit EOB from supine and was independent to stand. Pt ambulated on 3 L/min [place]. Pt back to bed after walk with call bell and phone at his side.   [facility] Reylene Stauder Mobility Specialist Mobility Specialist Phone: [phone]

## 2021-03-19 NOTE — Progress Notes (Signed)
Calorie Count Note: Day 2 Results  72-hour calorie count ordered. Calorie count started on 11/3 at lunch meal. Please see day 2 and day 3 results below.  Diet: Heart Healthy, thin liquids Supplements:  - Ensure Enlive BID - ProSource Plus 30 ml TID   Day 2: 11/4 Lunch: 673 kcal, 23 grams of protein 11/4 Dinner: 459 kcal, 20 grams of protein 11/5 Breakfast: 651 kcal, 12 grams of protein Snacks: 380 kcal, 6 grams of protein Supplements: 1000 kcal, 82 grams of protein  Day 2 total 24-hour intake: 3163 kcal (>100% of minimum estimated needs)  143 grams of protein (>100% of minimum estimated needs)   Day 3: 11/5 Lunch: 371 kcal, 18 grams of protein 11/5 Dinner: 485 kcal, 24 grams of protein 11/6 Breakfast: 451 kcal, 12 grams of protein Snacks: 220 kcal, 3 grams of protein Supplements: 1350 kcal, 105 grams of protein  Day 3 total 24-hour intake: 2877 kcal (>100% of minimum estimated needs)  162 grams of protein (>100% of minimum estimated needs)   Nutrition Diagnosis: Severe malnutrition related to chronic illness (CHF) as evidenced by severe muscle depletion, severe fat depletion.  Goal: Patient will meet greater than or equal to 90% of their needs.  Intervention:  - d/c 72-hour calorie count - Continue Ensure Enlive BID - Continue ProSource Plus 30 ml TID - Continue to encourage PO intake   Gustavus Bryant, MS, RD, LDN Inpatient Clinical Dietitian Please see AMiON for contact information.

## 2021-03-19 NOTE — Progress Notes (Signed)
CARDIAC REHAB PHASE I   Offered to walk with pt. Pt states he has had a rough morning with SOB. Eating lunch now. Will f/u later to continue to encourage ambulation as able.  Rufina Falco, RN BSN 03/19/2021 11:46 AM

## 2021-03-19 NOTE — Progress Notes (Signed)
The Pt. Sister and HCPOA-Patty provided Advance Directive:  HCPOA and LW documentation from outside the hospital.  The AD was placed in the Pt. chart and scanned into EMR.   The Pt. Succession of HCPOAs are: Cordelia Pen, Carl Best, and Colletta [place].  This chaplain is available for F/U spiritual care as needed.  Chaplain Sallyanne Kuster [phone]

## 2021-03-19 NOTE — Progress Notes (Signed)
VAD Coordinator met with patient today. Patient says he has no questions at this time re: VAD implant. He says the plan is to stay @ 1 week and hopefully, go home prior to getting VAD implant.  He says he has not met with VAD patient, but agrees to do so. VAD patient scheduled to see Mr. Kirk Wednesday, 03/21/21 of this week.  Zada Girt RN, [place] Coordinator [phone]

## 2021-03-19 NOTE — Progress Notes (Signed)
[facility] PROGRESS NOTE    Progress Note  Brandon [name]  ENI:[id number] DOB: [birth year] DOA: 03/07/2021 PCP: [name], MD     Brief Narrative:   Brandon [name] is an 65 y.o. male past medical history of coronary artery disease, chronic systolic heart failure with an AICD extracted on 12/01/2020 due to infection completed antibiotics on 01/15/2021 Rocephin, moderate aortic stenosis right upper extremity DVT comes into the ED for acute onset of dyspnea, was found hypoxic placed on 2 L of oxygen found to be in acute decompensated heart failure, cardiology consulted   Significant Events: 10/29 Co-ox 48%, started on milrinone 0.25.  10/30 IV Antibiotics stopped. Blood cultures no growth for 5 days.  10/31 started on augmentin. Milrinone cut back to 0.125 mcg.  03/12/2021 started on midodrine post was persistently symptomatic. 03/24/2021 started back on milrinone  Antibiotics: 03/07/2021 IV Vanco and cefepime till 03/10/2021 03/11/2021 Augmentin  Microbiology data: 03/07/2021 blood culture: Remain negative till date  Procedures: None  Assessment/Plan:   Acute on chronic respiratory failure with hypoxia (Brandon [name]) possibly due to pneumonia/sepsis: High-resolution CT scan showed bronchopneumonia, no LDL. Completed 7-day course of antibiotics has remained afebrile with no leukocytosis. His symptoms of shortness of breath is unlikely related to an infectious etiology. Physical therapy recommended no home health PT. Requiring 2 L of oxygen has been stable no change.  Acute respiratory failure with hypoxia due to acute on chronic systolic heart failure: Last 2D echo in July 2022 showed an EF of 20%. Currently on ivabradine, Lasix, digoxin, midodrine and milrinone. Did not had a good night sleep Getting short of breath. Chest x-ray showed pulmonary edema Further management per cardiology.  Dyslipidemia:  Continue statin therapy  Right upper extremity DVT with  neuropathy: Continue apixaban and gabapentin.  Depression: Continue sertraline.  BPH: Continue Flomax.  Hypokalemia: Continue to replete and recheck.  Mild hyponatremia: Likely due to heart failure.   Protein-calorie malnutrition, severe Ensure 3 times daily  Severe low-flow aortic stenosis: Not a candidate for TAVR was due to the presence of fibroblastoma, pending VAD work up.  Stage I sacral decubitus ulcer present on admission: RN Pressure Injury Documentation: Pressure Injury 12/01/20 Coccyx Medial Stage 1 -  Intact skin with non-blanchable redness of a localized area usually over a bony prominence. (Active)  12/01/20 0800  Location: Coccyx  Location Orientation: Medial  Staging: Stage 1 -  Intact skin with non-blanchable redness of a localized area usually over a bony prominence.  Wound Description (Comments):   Present on Admission:      Pressure Injury 12/02/20 Vertebral column Posterior;Mid blanchable but red (Active)  12/02/20 2000  Location: Vertebral column  Location Orientation: Posterior;Mid  Staging:   Wound Description (Comments): blanchable but red  Present on Admission: No      DVT prophylaxis: lovenox Family Communication:none Status is: Inpatient  Remains inpatient appropriate because: acute severity of liness    Code Status:     Code Status Orders  (From admission, onward)           Start     Ordered   03/07/21 1641  Full code  Continuous        03/07/21 1642           Code Status History     Date Active Date Inactive Code Status Order ID Comments User Context   01/08/2021 1003 01/09/2021 1426 Full Code [id number]  Brandon [place], NP ED   11/29/2020 1709 12/11/2020 2058 Full Code  [id number]  Brandon [place], NP Inpatient   11/29/2020 1107 11/29/2020 1709 Full Code [id number]  Jolaine Artist, MD Inpatient   12/31/2019 1107 12/31/2019 2205 Full Code [id number]  Ortencia Kick Inpatient   08/31/2019 1712 09/01/2019 1518 Full Code  [id number]  [name], MD Inpatient   04/27/2019 1927 05/04/2019 1953 Full Code [id number]  [name], MD Inpatient   04/27/2019 1926 04/27/2019 1927 Full Code [id number]  Barrett, Evelene Croon, PA-C Inpatient         IV Access:   Peripheral IV   Procedures and diagnostic studies:   DG Chest 2 View  Result Date: 03/18/2021 CLINICAL DATA:  CHF and shortness of breath. EXAM: CHEST - 2 VIEW COMPARISON:  Radiograph and chest CT 03/11/2021. FINDINGS: Left upper extremity PICC tip in the SVC. Stable heart size and mediastinal contours. Bilateral pleural effusions and hazy lung base opacities. Generalized worsening of bilateral upper lung zone patchy opacities. There is septal thickening consistent with pulmonary edema. No pneumothorax. Battery pack on the right with lead coursing into the neck unchanged. IMPRESSION: 1. Moderate bilateral pleural effusions. Mild pulmonary edema. 2. There is worsening of bilateral patchy suprahilar opacities, suspicious for superimposed pneumonia. Electronically Signed   By: Keith Rake M.D.   On: 03/18/2021 15:42     Medical Consultants:   None.   Subjective:    Brandon [name] not have a good night sleep, yesterday with ambulation with short of breath  Objective:    Vitals:   03/18/21 1900 03/18/21 2320 03/19/21 0323 03/19/21 0727  BP: 107/71 107/69 107/71   Pulse: 94 93 91 94  Resp: (!) 22 (!) 23 (!) 28 (!) 21  Temp: 97.9 F (36.6 C) 98.3 F (36.8 C) 97.6 F (36.4 C) 97.6 F (36.4 C)  TempSrc: Oral Oral Oral   SpO2: 92% 92% 92% 95%  Weight:   77.2 kg   Height:       SpO2: 95 % O2 Flow Rate (L/min): 2 L/min   Intake/Output Summary (Last 24 hours) at 03/19/2021 0758 Last data filed at 03/19/2021 0000 Gross per 24 hour  Intake 143.58 ml  Output 950 ml  Net -806.42 ml    Filed Weights   03/17/21 0613 03/18/21 0557 03/19/21 0323  Weight: 76.7 kg 76.4 kg 77.2 kg    Exam: General exam: In no acute  distress. Respiratory system: Good air movement and clear to auscultation. Cardiovascular system: S1 & S2 heard, RRR. No JVD. Gastrointestinal system: Abdomen is nondistended, soft and nontender.  Extremities: No pedal edema. Skin: No rashes, lesions or ulcers  Data Reviewed:    Labs: Basic Metabolic Panel: Recent Labs  Lab 03/15/21 0415 03/16/21 0320 03/17/21 0503 03/18/21 0538 03/19/21 0510  NA 133* 133* 133* 135 134*  K 3.8 3.9 3.6 3.6 3.9  CL 101 101 102 98 99  CO2 27 28 26 29 28   GLUCOSE 112* 128* 111* 109* 116*  BUN 13 14 14 16 17   CREATININE 0.58* 0.54* 0.49* 0.56* 0.59*  CALCIUM 8.8* 8.9 8.4* 8.9 8.8*  MG 2.0 2.0 2.0 2.0 2.1    GFR Estimated Creatinine Clearance: 97.8 mL/min (A) (by C-G formula based on SCr of 0.59 mg/dL (L)). Liver Function Tests: No results for input(s): AST, ALT, ALKPHOS, BILITOT, PROT, ALBUMIN in the last 168 hours.  No results for input(s): LIPASE, AMYLASE in the last 168 hours. No results for input(s): AMMONIA in the last 168 hours. Coagulation profile No results for input(s): INR,  PROTIME in the last 168 hours. COVID-19 Labs  No results for input(s): DDIMER, FERRITIN, LDH, CRP in the last 72 hours.  Lab Results  Component Value Date   SARSCOV2NAA NEGATIVE 03/07/2021   SARSCOV2NAA NEGATIVE 01/08/2021   [place] NEGATIVE 12/04/2020   [place] NEGATIVE 11/14/2020    CBC: Recent Labs  Lab 03/14/21 0400 03/15/21 0415 03/16/21 0320 03/17/21 0503 03/18/21 0538  WBC 13.1* 12.0* 11.6* 11.4* 10.4  HGB 9.8* 9.1* 9.0* 9.0* 9.1*  HCT 30.9* 29.1* 29.2* 29.3* 29.6*  MCV 78.2* 78.4* 78.5* 79.0* 79.4*  PLT 347 323 328 354 349    Cardiac Enzymes: No results for input(s): CKTOTAL, CKMB, CKMBINDEX, TROPONINI in the last 168 hours. BNP (last 3 results) Recent Labs    03/23/20 0922  PROBNP 520*    CBG: No results for input(s): GLUCAP in the last 168 hours. D-Dimer: No results for input(s): DDIMER in the last 72 hours. Hgb  A1c: No results for input(s): HGBA1C in the last 72 hours. Lipid Profile: No results for input(s): CHOL, HDL, LDLCALC, TRIG, CHOLHDL, LDLDIRECT in the last 72 hours. Thyroid function studies: No results for input(s): TSH, T4TOTAL, T3FREE, THYROIDAB in the last 72 hours.  Invalid input(s): FREET3 Anemia work up: Recent Labs    03/19/21 0510  TIBC 231*  IRON 30*   Sepsis Labs: Recent Labs  Lab 03/15/21 0415 03/16/21 0320 03/17/21 0503 03/18/21 0538  WBC 12.0* 11.6* 11.4* 10.4    Microbiology Recent Results (from the past 240 hour(s))  Urine Culture     Status: None   Collection Time: 03/09/21  5:25 PM   Specimen: Urine, Clean Catch  Result Value Ref Range Status   Specimen Description URINE, CLEAN CATCH  Final   Special Requests NONE  Final   Culture   Final    NO GROWTH Performed at [facility], [place] [address] [place], [place] [address]    Report Status 03/11/2021 FINAL  Final     Medications:    (feeding supplement) PROSource Plus  30 mL Oral TID BM   amoxicillin-clavulanate  1 tablet Oral Q12H   apixaban  5 mg Oral BID   atorvastatin  80 mg Oral QPM   Chlorhexidine Gluconate Cloth  6 each Topical Daily   digoxin  0.125 mg Oral Daily   diphenhydrAMINE  50 mg Oral QHS   ezetimibe  10 mg Oral Daily   feeding supplement  237 mL Oral BID BM   furosemide  40 mg Oral Daily   gabapentin  200 mg Oral TID   ivabradine  7.5 mg Oral BID WC   lidocaine  1 patch Transdermal Q24H   melatonin  5 mg Oral QHS   midodrine  10 mg Oral TID WC   multivitamin with minerals  1 tablet Oral Daily   pantoprazole  40 mg Oral Daily   potassium chloride  40 mEq Oral Daily   sertraline  50 mg Oral Daily   sodium chloride flush  10-40 mL Intracatheter Q12H   tamsulosin  0.4 mg Oral Daily   Continuous Infusions:  milrinone 0.125 mcg/kg/min (03/19/21 0000)      LOS: 11 days   Charlynne Cousins  [facility]  03/19/2021, 7:58 AM

## 2021-03-20 ENCOUNTER — Inpatient Hospital Stay (HOSPITAL_COMMUNITY): Payer: Medicare Other

## 2021-03-20 DIAGNOSIS — I351 Nonrheumatic aortic (valve) insufficiency: Secondary | ICD-10-CM

## 2021-03-20 DIAGNOSIS — I5023 Acute on chronic systolic (congestive) heart failure: Secondary | ICD-10-CM | POA: Diagnosis not present

## 2021-03-20 DIAGNOSIS — J189 Pneumonia, unspecified organism: Secondary | ICD-10-CM | POA: Diagnosis not present

## 2021-03-20 DIAGNOSIS — I340 Nonrheumatic mitral (valve) insufficiency: Secondary | ICD-10-CM

## 2021-03-20 DIAGNOSIS — J9621 Acute and chronic respiratory failure with hypoxia: Secondary | ICD-10-CM | POA: Diagnosis not present

## 2021-03-20 DIAGNOSIS — I10 Essential (primary) hypertension: Secondary | ICD-10-CM | POA: Diagnosis not present

## 2021-03-20 LAB — COOXEMETRY PANEL
Carboxyhemoglobin: 1.5 % (ref 0.5–1.5)
Methemoglobin: 0.7 % (ref 0.0–1.5)
O2 Saturation: 70.1 %
Total hemoglobin: 9.4 g/dL — ABNORMAL LOW (ref 12.0–16.0)

## 2021-03-20 LAB — BASIC METABOLIC PANEL
Anion gap: 8 (ref 5–15)
BUN: 22 mg/dL (ref 8–23)
CO2: 31 mmol/L (ref 22–32)
Calcium: 9 mg/dL (ref 8.9–10.3)
Chloride: 95 mmol/L — ABNORMAL LOW (ref 98–111)
Creatinine, Ser: 0.65 mg/dL (ref 0.61–1.24)
GFR, Estimated: >60 mL/min (ref >60)
Glucose, Bld: 106 mg/dL — ABNORMAL HIGH (ref 70–99)
Potassium: 3.8 mmol/L (ref 3.5–5.1)
Sodium: 134 mmol/L — ABNORMAL LOW (ref 135–145)

## 2021-03-20 LAB — ECHOCARDIOGRAM COMPLETE
AR max vel: 1.11 cm2
AV Area VTI: 1.13 cm2
AV Area mean vel: 1.09 cm2
AV Mean grad: 20 mmHg
AV Peak grad: 35.8 mmHg
Ao pk vel: 2.99 m/s
Area-P 1/2: 4.49 cm2
Height: 72 in
S' Lateral: 5.5 cm
Weight: 2663.16 oz

## 2021-03-20 MED ORDER — SODIUM CHLORIDE 0.9 % IV SOLN
510.0000 mg | Freq: Once | INTRAVENOUS | Status: AC
  Administered 2021-03-20: 510 mg via INTRAVENOUS
  Filled 2021-03-20: qty 17

## 2021-03-20 MED ORDER — PERFLUTREN LIPID MICROSPHERE
1.0000 mL | INTRAVENOUS | Status: AC | PRN
Start: 2021-03-20 — End: 2021-03-20
  Administered 2021-03-20: 2 mL via INTRAVENOUS
  Filled 2021-03-20: qty 10

## 2021-03-20 NOTE — Progress Notes (Addendum)
VAD Coordinator Rounding Note:   Met with patient at bedside. Reports he is feeling much better today, and that shortness of breath has improved. 4L out yesterday with diuresis. He walked "a long way" in the hall, even off the unit, this morning. He reports he is doing his best to ambulate in the hall a few times per day, and is eating all of his meals and nutritional supplements. Discussed plan for possible implant scheduled for 04/06/21 vs 04/16/21 depending on OR availability. Pt has no questions at this time.   Plan for current VAD patient to meet with pt at bedside this morning to discuss life with VAD. Pt is looking forward to speaking with him.   Emerson Monte RN [place] Coordinator  Office: [phone]  24/7 Pager: [phone]

## 2021-03-20 NOTE — Progress Notes (Addendum)
Patient ID: Brandon [name], male   DOB: 1955-08-04, 65 y.o.   MRN: [id number]     Advanced Heart Failure Rounding Note  PCP-Cardiologist: [name], MD   Subjective:    Admitted with sepsis. Started on cefepime + vanc, now off abx (ID following). Afebrile.  10/27 started on ivabradine 7.5 mg twice a day and midodrine 5 mg TID 10/29 Co-ox 48%, started on milrinone 0.25.  10/30 IV Antibiotics stopped. Blood cultures no growth for 5 days.  10/31 started on Augmentin for PNA.  11/1 Milrinone off 11/2 Milrinone added back. Midodrine increased to 10 mg TID  Remains on 0.125. CO-OX 70%.   Prealbumin trending up 11>15   Was volume overloaded yesterday and given 2 doses of IV lasix. Out over 4L. No weight charted yet  Still with good appetite. Eating as much as he can. Breathing better. Orthopnea resolved. No CP or PND.    Objective:   Weight Range: 77.2 kg Body mass index is 23.08 kg/m.   Vital Signs:   Temp:  [97.6 F (36.4 C)-98.3 F (36.8 C)] 97.9 F (36.6 C) (11/08 0410) Pulse Rate:  [82-101] 82 (11/08 0410) Resp:  [21-24] 24 (11/08 0410) BP: (100-118)/(72-75) 101/72 (11/08 0410) SpO2:  [91 %-97 %] 91 % (11/08 0410) Last BM Date: 03/17/21  Weight change: Filed Weights   03/17/21 0613 03/18/21 0557 03/19/21 0323  Weight: 76.7 kg 76.4 kg 77.2 kg    Intake/Output:   Intake/Output Summary (Last 24 hours) at 03/20/2021 0556 Last data filed at 03/20/2021 0500 Gross per 24 hour  Intake 1106.82 ml  Output 4200 ml  Net -3093.18 ml      Physical Exam   General:  Sitting up in bed . No resp difficulty HEENT: normal Neck: supple. no JVD. Carotids 2+ bilat; no bruits. No lymphadenopathy or thryomegaly appreciated. Cor: PMI nondisplaced. Regular rate & rhythm. 2/6 AS Lungs: crackles at bases Abdomen: soft, nontender, nondistended. No hepatosplenomegaly. No bruits or masses. Good bowel sounds. Extremities: no cyanosis, clubbing, rash, edema Neuro: alert & orientedx3,  cranial nerves grossly intact. moves all 4 extremities w/o difficulty. Affect pleasant   Telemetry   SR 80-90s personally reviewed.   Labs    CBC Recent Labs    03/18/21 0538  WBC 10.4  HGB 9.1*  HCT 29.6*  MCV 79.4*  PLT 962    Basic Metabolic Panel Recent Labs    03/18/21 0538 03/19/21 0510 03/20/21 0419  NA 135 134* 134*  K 3.6 3.9 3.8  CL 98 99 95*  CO2 29 28 31   GLUCOSE 109* 116* 106*  BUN 16 17 22   CREATININE 0.56* 0.59* 0.65  CALCIUM 8.9 8.8* 9.0  MG 2.0 2.1  --     Liver Function Tests No results for input(s): AST, ALT, ALKPHOS, BILITOT, PROT, ALBUMIN in the last 72 hours.  No results for input(s): LIPASE, AMYLASE in the last 72 hours. Cardiac Enzymes No results for input(s): CKTOTAL, CKMB, CKMBINDEX, TROPONINI in the last 72 hours.  BNP: BNP (last 3 results) Recent Labs    11/30/20 0144 01/08/21 0731 01/18/21 1629  BNP 2,653.1* 734.2* 607.9*     ProBNP (last 3 results) Recent Labs    03/23/20 0922  PROBNP 520*      D-Dimer No results for input(s): DDIMER in the last 72 hours. Hemoglobin A1C No results for input(s): HGBA1C in the last 72 hours. Fasting Lipid Panel No results for input(s): CHOL, HDL, LDLCALC, TRIG, CHOLHDL, LDLDIRECT in the last 72  hours. Thyroid Function Tests No results for input(s): TSH, T4TOTAL, T3FREE, THYROIDAB in the last 72 hours.  Invalid input(s): FREET3  Other results:   Imaging    No results found.   Medications:     Scheduled Medications:  (feeding supplement) PROSource Plus  30 mL Oral TID BM   apixaban  5 mg Oral BID   atorvastatin  80 mg Oral QPM   Chlorhexidine Gluconate Cloth  6 each Topical Daily   digoxin  0.125 mg Oral Daily   diphenhydrAMINE  50 mg Oral QHS   ezetimibe  10 mg Oral Daily   feeding supplement  237 mL Oral BID BM   furosemide  80 mg Oral Daily   gabapentin  200 mg Oral TID   ivabradine  7.5 mg Oral BID WC   lidocaine  1 patch Transdermal Q24H   melatonin  5  mg Oral QHS   midodrine  10 mg Oral TID WC   multivitamin with minerals  1 tablet Oral Daily   pantoprazole  40 mg Oral Daily   potassium chloride  40 mEq Oral Daily   sertraline  50 mg Oral Daily   sodium chloride flush  10-40 mL Intracatheter Q12H   tamsulosin  0.4 mg Oral Daily    Infusions:  milrinone 0.125 mcg/kg/min (03/20/21 0500)     PRN Medications: acetaminophen **OR** acetaminophen, guaiFENesin, ondansetron **OR** ondansetron (ZOFRAN) IV, sodium chloride flush    Patient Profile   Mr Raptis is a 65 year old with history of smoker, CAD s/p previous MI, HTN, HL, chronic HFeEF, sepsis 11/2020 bacteremia.  Admitted with sepsis. Bld Cx - NGTD    Assessment/Plan    1. Acute on chronic systolic HFr EF - Echo 7/0/62 EF 20-25% RV mildly HK. moderate AS  Mean gradient 13 AVA 1.2 cm2 DI 0.30 - RHC (7/22): EF 20%, low filling pressures. CI 2.3.  - NYHA IV at baseline - 11/2 Restarted 0.125 milrinone  - Co-ox 70% today.  - Volume status worse yesterday (despite CVP 5). Given IV lasix x 2 with good result - Po lasix increased to 80 daily  - No bb with low output HF - Continue ivabradine 7.5 mg twice a day.  - Continue digoxin. Dig level ok (0.3)  - Continue midodrine 10 mg TID   - Renal function stable.  - Discussed with [name] -  not a candidate for transplant.  - VAD w/u underway. Will try to support in house with milrinone, PT/OT and adequate nutrition with eye toward VAD in early December. May be able to go home for a short perid of time prior to VAD if he is improving.  - Potential VAD date moved up to 11/25  2. Sepsis - H/o strep bacteremia 11/2020.  ICD extracted 12/04/20. Barostim was not removed as it is extravascular.  - Had been on IV antibiotics until 01/15/21. Blood cultures negative 01/30/21. - WBCs 11.4 Afebrile.  - Procalcitonin 0.22->0.11 - 10/26 -Blood CX x2  No growth 5 days.  - Ct chest concerning for pneumonia.  - ?PNA => initially on  cefepime/vancomycin, now off both.  - Completed Augmentin 11/7   3. Acute Hypoxic Respiratory Failure  - CXR with pulmonary edema initially, and was diuresed with IV Lasix.  -  CT chest completed 10/30 concerning for bronchopneumonia. On augmentin.  -  Had pulmonary edema yesterday. Improved with IV lasix   4. CAD  s/p anterior STEMI (12/20). LHC showed chronically occluded RCA (with L>>R collaterals)  and thrombotic occlusion of proximal LAD. Underwent PCI of LAD. - LHC (7/22) with stable CAD.  - No s/s angina - Continue atorvastatin 80. No aspirin d/t anticoagulation.  5. 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 H./O RUE DVT - On eliquis. Will need to change to heparin as we get closer to VAD  7. Severe protein-calorie malnutrition - prealbumin trending up 11>15.  - Started on oral supplement.  - calorie count underway  8. Microcytic anemia - MCV 79.  - Iron Sats 13%.  - Considered IV Iron but want make sure clear of infection. Wil give later this week    Length of Stay: [place], MD  03/20/2021, 5:56 AM  Advanced Heart Failure Team Pager [phone] (M-F; 7a - 5p)  Please contact [facility] for night-coverage after hours (5p -7a ) and weekends on [email or web address]

## 2021-03-20 NOTE — Progress Notes (Signed)
[facility] PROGRESS NOTE    Progress Note  MASIAH WOODY  GYJ:[id number] DOB: [birth year] DOA: 03/07/2021 PCP: [name], MD     Brief Narrative:   TILLMON KISLING is an 65 y.o. male past medical history of coronary artery disease, chronic systolic heart failure with an AICD extracted on 12/01/2020 due to infection completed antibiotics on 01/15/2021 Rocephin, moderate aortic stenosis right upper extremity DVT comes into the ED for acute onset of dyspnea, was found hypoxic placed on 2 L of oxygen found to be in acute decompensated heart failure, cardiology consulted   Significant Events: 10/29 Co-ox 48%, started on milrinone 0.25.  10/30 IV Antibiotics stopped. Blood cultures no growth for 5 days.  10/31 started on augmentin. Milrinone cut back to 0.125 mcg.  03/12/2021 started on midodrine post was persistently symptomatic. 03/24/2021 started back on milrinone  Antibiotics: 03/07/2021 IV Vanco and cefepime till 03/10/2021 03/11/2021 Augmentin  Microbiology data: 03/07/2021 blood culture: Remain negative till date  Procedures: None  Assessment/Plan:   Acute on chronic respiratory failure with hypoxia ([place]) possibly due to pneumonia/sepsis: High-resolution CT scan showed bronchopneumonia, no LDL. Completed 7-day course of antibiotics has remained afebrile with no leukocytosis. His symptoms of shortness of breath is unlikely related to an infectious etiology. Physical therapy recommended no home health PT. Requiring 2 L of oxygen has been stable no change.  Acute respiratory failure with hypoxia due to acute on chronic systolic heart failure: Last 2D echo in July 2022 showed an EF of 20%. Currently on ivabradine, Lasix, digoxin, midodrine and milrinone. Did not had a good night sleep Getting short of breath. Chest x-ray showed pulmonary edema Cardiology recommended to give IV Lasix x2 and then changing to Lasix 80 mg daily. They recommended VATS for patient is  date has been potentially moved to 04/06/2021. Recheck a basic metabolic panel tomorrow morning becoming hyperchloremic mild increase in his creatinine.  Dyslipidemia:  Continue statin therapy  Right upper extremity DVT with neuropathy: Continue apixaban and gabapentin. Will probably need to change to heparin closer to VATS date.  Depression: Continue sertraline.  BPH: Continue Flomax.  Hypokalemia: Continue to replete and recheck.  Mild hyponatremia: Likely due to heart failure.   Protein-calorie malnutrition, severe Ensure 3 times daily  Severe low-flow aortic stenosis: Not a candidate for TAVR was due to the presence of fibroblastoma, pending VAD work up.  Severe protein caloric malnutrition: Prealbumin is trending up continue oral supplementation appreciate nutrition assistance.  Stage I sacral decubitus ulcer present on admission: RN Pressure Injury Documentation: Pressure Injury 12/01/20 Coccyx Medial Stage 1 -  Intact skin with non-blanchable redness of a localized area usually over a bony prominence. (Active)  12/01/20 0800  Location: Coccyx  Location Orientation: Medial  Staging: Stage 1 -  Intact skin with non-blanchable redness of a localized area usually over a bony prominence.  Wound Description (Comments):   Present on Admission:      Pressure Injury 12/02/20 Vertebral column Posterior;Mid blanchable but red (Active)  12/02/20 2000  Location: Vertebral column  Location Orientation: Posterior;Mid  Staging:   Wound Description (Comments): blanchable but red  Present on Admission: No      DVT prophylaxis: lovenox Family Communication:none Status is: Inpatient  Remains inpatient appropriate because: acute severity of liness    Code Status:     Code Status Orders  (From admission, onward)           Start     Ordered   03/07/21 1641  Full code  Continuous        03/07/21 1642           Code Status History     Date Active Date  Inactive Code Status Order ID Comments User Context   01/08/2021 1003 01/09/2021 1426 Full Code [id number]  Conrad [place], NP ED   11/29/2020 1709 12/11/2020 2058 Full Code [id number]  Conrad [place], NP Inpatient   11/29/2020 1107 11/29/2020 1709 Full Code [id number]  Bensimhon, Shaune Pascal, MD Inpatient   12/31/2019 1107 12/31/2019 2205 Full Code [id number]  Barbie Banner, PA-C Inpatient   08/31/2019 1712 09/01/2019 1518 Full Code [id number]  [name], MD Inpatient   04/27/2019 1927 05/04/2019 1953 Full Code [id number]  [name], MD Inpatient   04/27/2019 1926 04/27/2019 1927 Full Code [id number]  Barrett, Evelene Croon, PA-C Inpatient         IV Access:   Peripheral IV   Procedures and diagnostic studies:   DG Chest 2 View  Result Date: 03/18/2021 CLINICAL DATA:  CHF and shortness of breath. EXAM: CHEST - 2 VIEW COMPARISON:  Radiograph and chest CT 03/11/2021. FINDINGS: Left upper extremity PICC tip in the SVC. Stable heart size and mediastinal contours. Bilateral pleural effusions and hazy lung base opacities. Generalized worsening of bilateral upper lung zone patchy opacities. There is septal thickening consistent with pulmonary edema. No pneumothorax. Battery pack on the right with lead coursing into the neck unchanged. IMPRESSION: 1. Moderate bilateral pleural effusions. Mild pulmonary edema. 2. There is worsening of bilateral patchy suprahilar opacities, suspicious for superimposed pneumonia. Electronically Signed   By: Keith Rake M.D.   On: 03/18/2021 15:42     Medical Consultants:   None.   Subjective:    Patricio Popwell [place] symptoms are improved today.  Objective:    Vitals:   03/19/21 0923 03/19/21 1141 03/19/21 1930 03/20/21 0410  BP:  118/75 100/72 101/72  Pulse: (!) 101  85 82  Resp:  (!) 22 (!) 24 (!) 24  Temp:  98.3 F (36.8 C) 98.3 F (36.8 C) 97.9 F (36.6 C)  TempSrc:  Oral Oral Oral  SpO2:  97% 92% 91%  Weight:    75.5 kg  Height:       SpO2: 91 % O2  Flow Rate (L/min): 6 L/min   Intake/Output Summary (Last 24 hours) at 03/20/2021 0736 Last data filed at 03/20/2021 0500 Gross per 24 hour  Intake 1106.82 ml  Output 4200 ml  Net -3093.18 ml    Filed Weights   03/18/21 0557 03/19/21 0323 03/20/21 0410  Weight: 76.4 kg 77.2 kg 75.5 kg    Exam: General exam: In no acute distress. Respiratory system: Good air movement and clear to auscultation. Cardiovascular system: S1 & S2 heard, RRR. No JVD. Gastrointestinal system: Abdomen is nondistended, soft and nontender.  Extremities: No pedal edema. Skin: No rashes, lesions or ulcers Psychiatry: Judgement and insight appear normal. Mood & affect appropriate.  Data Reviewed:    Labs: Basic Metabolic Panel: Recent Labs  Lab 03/15/21 0415 03/16/21 0320 03/17/21 0503 03/18/21 0538 03/19/21 0510 03/20/21 0419  NA 133* 133* 133* 135 134* 134*  K 3.8 3.9 3.6 3.6 3.9 3.8  CL 101 101 102 98 99 95*  CO2 27 28 26 29 28 31   GLUCOSE 112* 128* 111* 109* 116* 106*  BUN 13 14 14 16 17 22   CREATININE 0.58* 0.54* 0.49* 0.56* 0.59* 0.65  CALCIUM 8.8* 8.9 8.4* 8.9 8.8* 9.0  MG  2.0 2.0 2.0 2.0 2.1  --     GFR Estimated Creatinine Clearance: 95.7 mL/min (by C-G formula based on SCr of 0.65 mg/dL). Liver Function Tests: No results for input(s): AST, ALT, ALKPHOS, BILITOT, PROT, ALBUMIN in the last 168 hours.  No results for input(s): LIPASE, AMYLASE in the last 168 hours. No results for input(s): AMMONIA in the last 168 hours. Coagulation profile No results for input(s): INR, PROTIME in the last 168 hours. COVID-19 Labs  No results for input(s): DDIMER, FERRITIN, LDH, CRP in the last 72 hours.  Lab Results  Component Value Date   SARSCOV2NAA NEGATIVE 03/07/2021   SARSCOV2NAA NEGATIVE 01/08/2021   [place] NEGATIVE 12/04/2020   [place] NEGATIVE 11/14/2020    CBC: Recent Labs  Lab 03/14/21 0400 03/15/21 0415 03/16/21 0320 03/17/21 0503 03/18/21 0538  WBC 13.1* 12.0*  11.6* 11.4* 10.4  HGB 9.8* 9.1* 9.0* 9.0* 9.1*  HCT 30.9* 29.1* 29.2* 29.3* 29.6*  MCV 78.2* 78.4* 78.5* 79.0* 79.4*  PLT 347 323 328 354 349    Cardiac Enzymes: No results for input(s): CKTOTAL, CKMB, CKMBINDEX, TROPONINI in the last 168 hours. BNP (last 3 results) Recent Labs    03/23/20 0922  PROBNP 520*    CBG: No results for input(s): GLUCAP in the last 168 hours. D-Dimer: No results for input(s): DDIMER in the last 72 hours. Hgb A1c: No results for input(s): HGBA1C in the last 72 hours. Lipid Profile: No results for input(s): CHOL, HDL, LDLCALC, TRIG, CHOLHDL, LDLDIRECT in the last 72 hours. Thyroid function studies: No results for input(s): TSH, T4TOTAL, T3FREE, THYROIDAB in the last 72 hours.  Invalid input(s): FREET3 Anemia work up: Recent Labs    03/19/21 0510  TIBC 231*  IRON 30*    Sepsis Labs: Recent Labs  Lab 03/15/21 0415 03/16/21 0320 03/17/21 0503 03/18/21 0538  WBC 12.0* 11.6* 11.4* 10.4    Microbiology No results found for this or any previous visit (from the past 240 hour(s)).    Medications:    (feeding supplement) PROSource Plus  30 mL Oral TID BM   apixaban  5 mg Oral BID   atorvastatin  80 mg Oral QPM   Chlorhexidine Gluconate Cloth  6 each Topical Daily   digoxin  0.125 mg Oral Daily   diphenhydrAMINE  50 mg Oral QHS   ezetimibe  10 mg Oral Daily   feeding supplement  237 mL Oral BID BM   furosemide  80 mg Oral Daily   gabapentin  200 mg Oral TID   ivabradine  7.5 mg Oral BID WC   lidocaine  1 patch Transdermal Q24H   melatonin  5 mg Oral QHS   midodrine  10 mg Oral TID WC   multivitamin with minerals  1 tablet Oral Daily   pantoprazole  40 mg Oral Daily   potassium chloride  40 mEq Oral Daily   sertraline  50 mg Oral Daily   sodium chloride flush  10-40 mL Intracatheter Q12H   tamsulosin  0.4 mg Oral Daily   Continuous Infusions:  milrinone 0.125 mcg/kg/min (03/20/21 0500)      LOS: 12 days   Charlynne Cousins  [facility]  03/20/2021, 7:36 AM

## 2021-03-20 NOTE — Progress Notes (Signed)
Mobility Specialist Progress Note    03/20/21 1348  Mobility  Activity Ambulated in hall  Level of Assistance Modified independent, requires aide device or extra time  Assistive Device  (IV pole)  Distance Ambulated (ft) 410 ft  Mobility Ambulated with assistance in hallway  Mobility Response Tolerated well  Mobility performed by Mobility specialist  $Mobility charge 1 Mobility   Pre-Mobility: 84 HR, 94% SpO2 During Mobility: 100 HR, 90% SpO2 Post-Mobility: 84 HR, 92% SpO2  Pt received in bed and agreeable. No complaints on walk. Maintained HR 96-100 during. Maintained SpO2 around 90% on walk. Returned to bed with call bell in reach and family present.   Hildred Alamin Mobility Specialist  Mobility Specialist Phone: [phone]

## 2021-03-20 NOTE — Progress Notes (Signed)
  Echocardiogram 2D Echocardiogram with contrast has been performed.  Merrie Roof F 03/20/2021, 6:10 PM

## 2021-03-21 ENCOUNTER — Inpatient Hospital Stay (HOSPITAL_COMMUNITY): Admit: 2021-03-21 | Discharge: 2021-03-21 | Disposition: A | Discharge status: Home / Self Care | Payer: Medicare Other

## 2021-03-21 ENCOUNTER — Inpatient Hospital Stay (HOSPITAL_COMMUNITY): Payer: Medicare Other

## 2021-03-21 ENCOUNTER — Encounter (HOSPITAL_COMMUNITY): Payer: Medicare Other

## 2021-03-21 DIAGNOSIS — I10 Essential (primary) hypertension: Secondary | ICD-10-CM | POA: Diagnosis not present

## 2021-03-21 DIAGNOSIS — R7881 Bacteremia: Secondary | ICD-10-CM | POA: Diagnosis not present

## 2021-03-21 DIAGNOSIS — J9621 Acute and chronic respiratory failure with hypoxia: Secondary | ICD-10-CM | POA: Diagnosis not present

## 2021-03-21 DIAGNOSIS — J690 Pneumonitis due to inhalation of food and vomit: Secondary | ICD-10-CM | POA: Diagnosis not present

## 2021-03-21 LAB — BASIC METABOLIC PANEL
Anion gap: 7 (ref 5–15)
BUN: 22 mg/dL (ref 8–23)
CO2: 30 mmol/L (ref 22–32)
Calcium: 8.9 mg/dL (ref 8.9–10.3)
Chloride: 97 mmol/L — ABNORMAL LOW (ref 98–111)
Creatinine, Ser: 0.65 mg/dL (ref 0.61–1.24)
GFR, Estimated: >60 mL/min (ref >60)
Glucose, Bld: 107 mg/dL — ABNORMAL HIGH (ref 70–99)
Potassium: 3.8 mmol/L (ref 3.5–5.1)
Sodium: 134 mmol/L — ABNORMAL LOW (ref 135–145)

## 2021-03-21 LAB — COOXEMETRY PANEL
Carboxyhemoglobin: 1.7 % — ABNORMAL HIGH (ref 0.5–1.5)
Carboxyhemoglobin: 1.4 % (ref 0.5–1.5)
Methemoglobin: 1 % (ref 0.0–1.5)
Methemoglobin: 1 % (ref 0.0–1.5)
O2 Saturation: 56.6 %
O2 Saturation: 56.5 %
Total hemoglobin: 9.7 g/dL — ABNORMAL LOW (ref 12.0–16.0)
Total hemoglobin: 8.6 g/dL — ABNORMAL LOW (ref 12.0–16.0)

## 2021-03-21 LAB — PROCALCITONIN: Procalcitonin: <0.1 ng/mL

## 2021-03-21 MED ORDER — FUROSEMIDE 10 MG/ML IJ SOLN
80.0000 mg | Freq: Two times a day (BID) | INTRAMUSCULAR | Status: DC
  Administered 2021-03-21 – 2021-03-24 (×6): 80 mg via INTRAVENOUS
  Filled 2021-03-21 (×6): qty 8

## 2021-03-21 MED ORDER — FUROSEMIDE 10 MG/ML IJ SOLN: 80.0000 mg | Freq: Four times a day (QID) | INTRAMUSCULAR | Status: DC

## 2021-03-21 MED ORDER — POTASSIUM CHLORIDE CRYS ER 20 MEQ PO TBCR
40.0000 meq | EXTENDED_RELEASE_TABLET | Freq: Two times a day (BID) | ORAL | Status: DC
  Administered 2021-03-21 – 2021-03-28 (×15): 40 meq via ORAL
  Filled 2021-03-21: qty 2
  Filled 2021-03-21: qty 4
  Filled 2021-03-21 (×11): qty 2
  Filled 2021-03-21: qty 4
  Filled 2021-03-21: qty 2

## 2021-03-21 MED ORDER — FUROSEMIDE 10 MG/ML IJ SOLN
40.0000 mg | Freq: Once | INTRAMUSCULAR | Status: AC
  Administered 2021-03-21: 40 mg via INTRAVENOUS
  Filled 2021-03-21: qty 4

## 2021-03-21 MED ORDER — FUROSEMIDE 10 MG/ML IJ SOLN
80.0000 mg | Freq: Four times a day (QID) | INTRAMUSCULAR | Status: DC
  Administered 2021-03-21: 80 mg via INTRAVENOUS

## 2021-03-21 MED ORDER — FUROSEMIDE 10 MG/ML IJ SOLN
80.0000 mg | Freq: Two times a day (BID) | INTRAMUSCULAR | Status: DC
  Filled 2021-03-21: qty 8

## 2021-03-21 NOTE — Progress Notes (Signed)
Mobility Specialist Progress Note    03/21/21 1441  Mobility  Activity Contraindicated/medical hold   Pt still not feeling well. Will f/u tomorrow.   Hildred Alamin Mobility Specialist  Mobility Specialist Phone: [phone]

## 2021-03-21 NOTE — Progress Notes (Addendum)
Patient ID: Brandon [name], male   DOB: 05-07-1956, 65 y.o.   MRN: [id number]     Advanced Heart Failure Rounding Note  PCP-Cardiologist: [name], MD   Subjective:    Admitted with sepsis. Started on cefepime + vanc, now off abx (ID following). Afebrile.  10/27 started on ivabradine 7.5 mg twice a day and midodrine 5 mg TID 10/29 Co-ox 48%, started on milrinone 0.25.  10/30 IV Antibiotics stopped. Blood cultures no growth for 5 days.  10/31 started on Augmentin for PNA.  11/1 Milrinone off 11/2 Milrinone added back. Midodrine increased to 10 mg TID 11/8 Placed on 80 mg po lasix   Yesterday able walk 2 laps around the unit without difficulty. Over night developed orthopnea and chest discomfort. Had to sit in the recliner. Given dose of IV lasix. Still feels bad.   CXR today -->Small pleural effusions. Suspected volume overload + pneumonia Procalcitionin < 0.1  Remains on 0.125. CO-OX 56.5%.    Objective:   Weight Range: 77 kg Body mass index is 23.02 kg/m.   Vital Signs:   Temp:  [97.6 F (36.4 C)-98.5 F (36.9 C)] 97.6 F (36.4 C) (11/09 0241) Pulse Rate:  [77-92] 88 (11/09 0851) Resp:  [15-22] 17 (11/09 0752) BP: (92-113)/(54-81) 105/69 (11/09 0752) SpO2:  [94 %-97 %] 95 % (11/09 0752) Weight:  [77 kg] 77 kg (11/09 0500) Last BM Date: 03/17/21  Weight change: Filed Weights   03/19/21 0323 03/20/21 0410 03/21/21 0500  Weight: 77.2 kg 75.5 kg 77 kg    Intake/Output:   Intake/Output Summary (Last 24 hours) at 03/21/2021 0957 Last data filed at 03/21/2021 0949 Gross per 24 hour  Intake 1721.95 ml  Output 1725 ml  Net -3.05 ml     Physical Exam  CVP 7 General:  Sitting in the recliner. . No resp difficulty HEENT: normal Neck: supple. JVP 7-8  Carotids 2+ bilat; no bruits. No lymphadenopathy or thryomegaly appreciated. Cor: PMI nondisplaced. Regular rate & rhythm. No rubs, gallops. 2/6 AS . Lungs: Decreased in the bases Abdomen: soft, nontender,  nondistended. No hepatosplenomegaly. No bruits or masses. Good bowel sounds. Extremities: no cyanosis, clubbing, rash, edema Neuro: alert & orientedx3, cranial nerves grossly intact. moves all 4 extremities w/o difficulty. Affect pleasant   Telemetry   SR 80-90s personally reviewed.   Labs    CBC No results for input(s): WBC, NEUTROABS, HGB, HCT, MCV, PLT in the last 72 hours. Basic Metabolic Panel Recent Labs    03/19/21 0510 03/20/21 0419 03/21/21 0132  NA 134* 134* 134*  K 3.9 3.8 3.8  CL 99 95* 97*  CO2 28 31 30   GLUCOSE 116* 106* 107*  BUN 17 22 22   CREATININE 0.59* 0.65 0.65  CALCIUM 8.8* 9.0 8.9  MG 2.1  --   --    Liver Function Tests No results for input(s): AST, ALT, ALKPHOS, BILITOT, PROT, ALBUMIN in the last 72 hours.  No results for input(s): LIPASE, AMYLASE in the last 72 hours. Cardiac Enzymes No results for input(s): CKTOTAL, CKMB, CKMBINDEX, TROPONINI in the last 72 hours.  BNP: BNP (last 3 results) Recent Labs    11/30/20 0144 01/08/21 0731 01/18/21 1629  BNP 2,653.1* 734.2* 607.9*    ProBNP (last 3 results) Recent Labs    03/23/20 0922  PROBNP 520*     D-Dimer No results for input(s): DDIMER in the last 72 hours. Hemoglobin A1C No results for input(s): HGBA1C in the last 72 hours. Fasting Lipid Panel No  results for input(s): CHOL, HDL, LDLCALC, TRIG, CHOLHDL, LDLDIRECT in the last 72 hours. Thyroid Function Tests No results for input(s): TSH, T4TOTAL, T3FREE, THYROIDAB in the last 72 hours.  Invalid input(s): FREET3  Other results:   Imaging    DG CHEST PORT 1 VIEW  Result Date: 03/21/2021 CLINICAL DATA:  Shortness of breath EXAM: PORTABLE CHEST 1 VIEW COMPARISON:  03/18/2021 FINDINGS: Stimulator battery pack in the right chest with leads extending into the neck. Hazy and patchy opacity over the bilateral chest with small pleural effusions. Borderline heart size. Left PICC with tip at the distal SVC. Ankylosing spondylitis  with costovertebral ossification bilaterally. IMPRESSION: Stable airspace disease and small pleural effusions, likely volume overload and pneumonia. Electronically Signed   By: Jorje Guild M.D.   On: 03/21/2021 04:22   ECHOCARDIOGRAM COMPLETE  Result Date: 03/20/2021    ECHOCARDIOGRAM REPORT   Patient Name:   Brandon [name] Date of Exam: 03/20/2021 Medical Rec #:  [id number]      Height:       72.0 in Accession #:    [id number]     Weight:       166.4 lb Date of Birth:  1955/07/26       BSA:          1.971 m Patient Age:    78 years       BP:           107/81 mmHg Patient Gender: M              HR:           79 bpm. Exam Location:  Inpatient Procedure: 2D Echo, Cardiac Doppler, Color Doppler and Intracardiac            Opacification Agent Indications:    Pre-LVAD evaluation  History:        Patient has prior history of Echocardiogram examinations, most                 recent 11/30/2020. CHF, Previous Myocardial Infarction and CAD,                 Defibrillator; Aortic Valve Disease.  Sonographer:    Merrie Roof RDCS Referring Phys: [place]  1. There is no left ventricular thrombus (Definity contrast). There is apical akinesis/mild dyskinesis. There akinesis of the anterior and anteroseptal walls and severe hypokinesis of the inferior wall. Left ventricular ejection fraction, by estimation,  is 20 to 25%. The left ventricle has severely decreased function. The left ventricle demonstrates regional wall motion abnormalities (see scoring diagram/findings for description). The left ventricular internal cavity size was moderately dilated. There is mild eccentric left ventricular hypertrophy. Left ventricular diastolic parameters are consistent with Grade II diastolic dysfunction (pseudonormalization). Elevated left atrial pressure.  2. Right ventricular systolic function is normal. The right ventricular size is normal.  3. Left atrial size was severely dilated.  4. The mitral valve is  degenerative. Mild to moderate mitral valve regurgitation. No evidence of mitral stenosis.  5. The aortic valve is tricuspid. There is moderate calcification of the aortic valve. There is moderate thickening of the aortic valve. Aortic valve regurgitation is moderate. Aortic valve mean gradient measures 20.0 mmHg. Aortic valve Vmax measures 2.99 m/s.  6. The inferior vena cava is normal in size with greater than 50% respiratory variability, suggesting right atrial pressure of 3 mmHg. FINDINGS  Left Ventricle: There is no left ventricular thrombus (Definity contrast). There is apical  akinesis/mild dyskinesis. There akinesis of the anterior and anteroseptal walls and severe hypokinesis of the inferior wall. Left ventricular ejection fraction, by estimation, is 20 to 25%. The left ventricle has severely decreased function. The left ventricle demonstrates regional wall motion abnormalities. Definity contrast agent was given IV to delineate the left ventricular endocardial borders. The left ventricular internal cavity size was moderately dilated. There is mild eccentric left ventricular hypertrophy. Left ventricular diastolic parameters are consistent with Grade II diastolic dysfunction (pseudonormalization). Elevated left atrial pressure.  LV Wall Scoring: The apical septal segment and apex are dyskinetic. The entire anterior wall, mid anteroseptal segment, apical lateral segment, and apical inferior segment are akinetic. The inferior wall, basal anteroseptal segment, mid inferoseptal segment, and basal inferoseptal segment are hypokinetic. The antero-lateral wall and posterior wall are normal. Right Ventricle: The right ventricular size is normal. No increase in right ventricular wall thickness. Right ventricular systolic function is normal. Left Atrium: Left atrial size was severely dilated. Right Atrium: Right atrial size was normal in size. Pericardium: There is no evidence of pericardial effusion. Mitral Valve: The  mitral valve is degenerative in appearance. Mild to moderate mitral annular calcification. Mild to moderate mitral valve regurgitation, with centrally-directed jet. No evidence of mitral valve stenosis. Tricuspid Valve: The tricuspid valve is normal in structure. Tricuspid valve regurgitation is not demonstrated. Aortic Valve: The aortic valve is tricuspid. There is moderate calcification of the aortic valve. There is moderate thickening of the aortic valve. Aortic valve regurgitation is moderate. Aortic valve mean gradient measures 20.0 mmHg. Aortic valve peak gradient measures 35.8 mmHg. Aortic valve area, by VTI measures 1.13 cm. Pulmonic Valve: The pulmonic valve was normal in structure. Pulmonic valve regurgitation is not visualized. Aorta: The aortic root is normal in size and structure. Venous: The inferior vena cava is normal in size with greater than 50% respiratory variability, suggesting right atrial pressure of 3 mmHg. IAS/Shunts: No atrial level shunt detected by color flow Doppler.  LEFT VENTRICLE PLAX 2D LVIDd:         6.00 cm   Diastology LVIDs:         5.50 cm   LV e' medial:    8.16 cm/s LV PW:         1.30 cm   LV E/e' medial:  19.0 LV IVS:        1.40 cm   LV e' lateral:   10.90 cm/s LVOT diam:     2.00 cm   LV E/e' lateral: 14.2 LV SV:         63 LV SV Index:   32 LVOT Area:     3.14 cm  RIGHT VENTRICLE RV Basal diam:  4.10 cm RV Mid diam:    4.00 cm LEFT ATRIUM              Index        RIGHT ATRIUM           Index LA diam:        4.90 cm  2.49 cm/m   RA Area:     19.80 cm LA Vol (A2C):   124.0 ml 62.92 ml/m  RA Volume:   62.10 ml  31.51 ml/m LA Vol (A4C):   83.4 ml  42.32 ml/m LA Biplane Vol: 104.0 ml 52.78 ml/m  AORTIC VALVE AV Area (Vmax):    1.11 cm AV Area (Vmean):   1.09 cm AV Area (VTI):     1.13 cm AV Vmax:  299.00 cm/s AV Vmean:          208.000 cm/s AV VTI:            0.562 m AV Peak Grad:      35.8 mmHg AV Mean Grad:      20.0 mmHg LVOT Vmax:         106.00 cm/s  LVOT Vmean:        72.100 cm/s LVOT VTI:          0.202 m LVOT/AV VTI ratio: 0.36  AORTA Ao Root diam: 3.60 cm MITRAL VALVE MV Area (PHT): 4.49 cm     SHUNTS MV Decel Time: 169 msec     Systemic VTI:  0.20 m MV E velocity: 155.00 cm/s  Systemic Diam: 2.00 cm MV A velocity: 81.90 cm/s MV E/A ratio:  1.89 Mihai Croitoru MD Electronically signed by Sanda Klein MD Signature Date/Time: 03/20/2021/6:39:08 PM    Final      Medications:     Scheduled Medications:  (feeding supplement) PROSource Plus  30 mL Oral TID BM   apixaban  5 mg Oral BID   atorvastatin  80 mg Oral QPM   Chlorhexidine Gluconate Cloth  6 each Topical Daily   digoxin  0.125 mg Oral Daily   diphenhydrAMINE  50 mg Oral QHS   ezetimibe  10 mg Oral Daily   feeding supplement  237 mL Oral BID BM   furosemide  80 mg Intravenous Q6H   gabapentin  200 mg Oral TID   ivabradine  7.5 mg Oral BID WC   lidocaine  1 patch Transdermal Q24H   melatonin  5 mg Oral QHS   midodrine  10 mg Oral TID WC   multivitamin with minerals  1 tablet Oral Daily   pantoprazole  40 mg Oral Daily   potassium chloride  40 mEq Oral Daily   sertraline  50 mg Oral Daily   sodium chloride flush  10-40 mL Intracatheter Q12H   tamsulosin  0.4 mg Oral Daily    Infusions:  milrinone 0.125 mcg/kg/min (03/21/21 0937)     PRN Medications: acetaminophen **OR** acetaminophen, guaiFENesin, ondansetron **OR** ondansetron (ZOFRAN) IV, sodium chloride flush    Patient Profile   Brandon [name] is a 64 year old with history of smoker, CAD s/p previous MI, HTN, HL, chronic HFeEF, sepsis 11/2020 bacteremia.  Admitted with sepsis. Bld Cx - NGTD    Assessment/Plan    1. Acute on chronic systolic HFr EF - Echo 01/16/77 EF 20-25% RV mildly HK. moderate AS  Mean gradient 13 AVA 1.2 cm2 DI 0.30 - RHC (7/22): EF 20%, low filling pressures. CI 2.3.  - NYHA IV at baseline - 11/2 Restarted 0.125 milrinone  - Co-ox 57%.  - Narrow euvolemic window. CVP 7 today. Even though  he has low CVP he has had good response with IV lasix. Will need to restart 80 mg IV lasix x2. Increase KDure to 40 meq twice a day. Anticipate using torsemide once volume status improved. Renal function stable.  - No bb with low output HF - Continue ivabradine 7.5 mg twice a day.  - Continue digoxin. Dig level ok (0.3)  - Continue midodrine 10 mg TID   - Discussed with [name] -  not a candidate for transplant.  - VAD w/u underway. Will try to support in house with milrinone, PT/OT and adequate nutrition with eye toward VAD in early December. May be able to go home for a short perid of time  prior to VAD if he is improving.  - Potential VAD date moved up to 11/25  2. Sepsis - H/o strep bacteremia 11/2020.  ICD extracted 12/04/20. Barostim was not removed as it is extravascular.  - Had been on IV antibiotics until 01/15/21. Blood cultures negative 01/30/21. - WBCs 11.4 Afebrile.  - Procalcitonin 0.22->0.11 - 10/26 -Blood CX x2  No growth 5 days.  - Ct chest concerning for pneumonia.  - ?PNA => initially on cefepime/vancomycin, now off both.  - Completed Augmentin 11/7  - 11/9 Procalcitonin < 0.1  3. Acute Hypoxic Respiratory Failure  - CXR with pulmonary edema initially, and was diuresed with IV Lasix.  -  CT chest completed 10/30 concerning for bronchopneumonia. On augmentin.  -  recurrent pulmonary edema. Restart IV lasix.    4. CAD  s/p anterior STEMI (12/20). LHC showed chronically occluded RCA (with L>>R collaterals) and thrombotic occlusion of proximal LAD. Underwent PCI of LAD. - LHC (7/22) with stable CAD.  - No s/s angina - Continue atorvastatin 80. No aspirin d/t anticoagulation.  5. 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 H./O RUE DVT - On eliquis. Will need to change to heparin as we get closer to VAD  7. Severe protein-calorie malnutrition - prealbumin trending up 11>15.  - Continue oral supplement.  -  calorie count underway  8. Microcytic anemia - MCV 79.  - Iron Sats 13%.  - Considered IV Iron but want make sure clear of infection. Wil give later this week   Will watch for response with IV lasix.   Length of Stay: [place], NP  03/21/2021, 9:57 AM  Advanced Heart Failure Team Pager [phone] (M-F; 7a - 5p)  Please contact [facility] for night-coverage after hours (5p -7a ) and weekends on [email or web address]  Patient seen and examined with the above-signed Advanced Practice Provider and/or Housestaff. I personally reviewed laboratory data, imaging studies and relevant notes. I independently examined the patient and formulated the important aspects of the plan. I have edited the note to reflect any of my changes or salient points. I have personally discussed the plan with the patient and/or family.  Developed worsening HF again overnight with respiratory distress despite increased lasix. IV lasix restarted. Remains on milrinone. Co-ox marginal. CXR still wet  General:  Sitting up in chair No resp difficulty HEENT: normal Neck: supple. JVP 7. Carotids 2+ bilat; no bruits. No lymphadenopathy or thryomegaly appreciated. Cor: PMI nondisplaced. Regular rate & rhythm. 2/ Lungs: + crackles Abdomen: soft, nontender, nondistended. No hepatosplenomegaly. No bruits or masses. Good bowel sounds. Extremities: no cyanosis, clubbing, rash, edema Neuro: alert & orientedx3, cranial nerves grossly intact. moves all 4 extremities w/o difficulty. Affect pleasant  Remains very tenuous. With frequent HF decompensations despite milrinone support and low CVP. Will push IV diuresis for 2 days. Follow BP and renal function closely. Continue to work toward VAD.   If we can stabilize HF perhaps can consider CIR prior to VAD.  Glori Bickers, MD  11:15 AM

## 2021-03-21 NOTE — Progress Notes (Signed)
Nutrition Follow-up  DOCUMENTATION CODES:   Severe malnutrition in context of chronic illness  INTERVENTION:   - Ensure Enlive po BID, each supplement provides 350 kcal and 20 grams of protein   - ProSource Plus 30 ml po TID, each supplement provides 100 kcal and 15 grams of protein   - Continue MVI with minerals daily   - Encourage PO intake  NUTRITION DIAGNOSIS:   Severe Malnutrition related to chronic illness (CHF) as evidenced by severe muscle depletion, severe fat depletion.  Ongoing, being addressed via oral nutrition supplements  GOAL:   Patient will meet greater than or equal to 90% of their needs  Met  MONITOR:   PO intake, Supplement acceptance, Weight trends, Labs, I & O's  REASON FOR ASSESSMENT:   Consult LVAD Eval  ASSESSMENT:   65 y.o. male presented to the ED with sudden onset of dyspnea. PMH includes CAD, CHF with ICD, HTN, and on 2L [place] at baseline. Pt admitted with respiratory failure with hypoxia with SIRS.  RD completed a 72-hour calorie count for pt. On each of the 3 days, pt was found to be meeting >100% of minimum kcal and protein needs.  Noted potential LVAD date moved up to 11/25.  Spoke with pt at bedside. Pt had a rough night with worsening HF causing respiratory distress. IV lasix has been restarted. Per HF team notes, pt remains very tenuous. Pt reports that he ate most of his breakfast and is going to do the best he can with lunch but is unsure if he will be able to eat much due to difficulty breathing. Pt reports that he will drink his supplements.  Admit weight: 76.9 kg Current weight: 77 kg  Meal Completion: 75-100%  Medications reviewed and include: ProSource Plus TID, Ensure Enlive BID, IV lasix, melatonin, MVI with minerals, protonix, klor-con 40 mEq daily, milrinone drip  Labs reviewed: sodium 134  UOP: 1150 ml x 24 hours I/O's: -11.0 L since admit  Diet Order:   Diet Order             Diet Heart Room service  appropriate? Yes; Fluid consistency: Thin  Diet effective now                   EDUCATION NEEDS:   No education needs have been identified at this time  Skin:  Skin Assessment: Reviewed RN Assessment  Last BM:  03/17/21  Height:   Ht Readings from Last 1 Encounters:  03/07/21 6' (1.829 m)    Weight:   Wt Readings from Last 1 Encounters:  03/21/21 77 kg    Ideal Body Weight:  80.9 kg  BMI:  Body mass index is 23.02 kg/m.  Estimated Nutritional Needs:   Kcal:  2300-2500  Protein:  115-130 grams  Fluid:  >/= 2 L    Gustavus Bryant, MS, RD, LDN Inpatient Clinical Dietitian Please see AMiON for contact information.

## 2021-03-21 NOTE — TOC Initial Note (Signed)
Transition of Care [facility]) - Initial/Assessment Note    Patient Details  Name: Brandon [name] MRN: [id number] Date of Birth: 03/22/1956  Transition of Care [facility]) CM/SW Contact:    Erenest Rasher, RN Phone Number: [phone] 03/21/2021, 5:17 PM  Clinical Narrative:                 HF TOC CM spoke to pt at bedside. States he had [place] in the past for [facility]. Has RW, bedside commode and oxygen at home. Pt is currently LVAD work up. Will continue to follow for dc needs.   Expected Discharge Plan: [place] Barriers to Discharge: Continued Medical Work up   Patient Goals and CMS Choice Patient states their goals for this hospitalization and ongoing recovery are:: wants to remain independent CMS Medicare.gov Compare Post Acute Care list provided to:: Patient Choice offered to / list presented to : Patient  Expected Discharge Plan and Services Expected Discharge Plan: [employer] In-house Referral: Clinical Social Work Discharge Planning Services: CM Consult Post Acute Care Choice: [place] arrangements for the past 2 months: [place]                                      Prior Living Arrangements/Services Living arrangements for the past 2 months: Single Family Home Lives with:: Self Patient language and need for interpreter reviewed:: No Do you feel safe going back to the place where you live?: Yes      Need for Family Participation in Patient Care: Yes (Comment) Care giver support system in place?: Yes (comment) Current home services:  (rolling walker, bedside commode, oxygen ([place])) Criminal Activity/Legal Involvement Pertinent to Current Situation/Hospitalization: No - Comment as needed  Activities of Daily Living      Permission Sought/Granted Permission sought to share information with : Case Manager, Family Supports, PCP Permission granted to share information with : Yes, Verbal  Permission Granted  Share Information with NAME: Patty Laverle Patter  Permission granted to share info w AGENCY: [place] granted to share info w Relationship: sister  Permission granted to share info w Contact Information: [phone]  Emotional Assessment Appearance:: Appears stated age Attitude/Demeanor/Rapport: Gracious, Engaged Affect (typically observed): Accepting Orientation: : Oriented to Self, Oriented to Place, Oriented to  Time, Oriented to Situation   Psych Involvement: No (comment)  Admission diagnosis:  Acute on chronic respiratory failure (HCC) [J96.20] Respiratory failure (HCC) [J96.90] Patient Active Problem List   Diagnosis Date Noted   Protein-calorie malnutrition, severe 03/14/2021   Aspiration pneumonia of both upper lobes ([place])    CAP (community acquired pneumonia)    ILD (interstitial lung disease) ([place])    Ankylosing spondylitis ([place])    Leukocytosis    Acute on chronic respiratory failure with hypoxia ([place]) 03/07/2021   Systemic inflammatory response syndrome (SIRS) ([place]) 69/48/5462   Chronic systolic CHF (congestive heart failure) ([place]) 03/07/2021   Acute on chronic systolic (congestive) heart failure ([place]) 01/08/2021   ICD (implantable cardioverter-defibrillator) in place    Dental caries    Malnutrition of moderate degree 12/01/2020   Pressure injury of skin 12/01/2020   Cholelithiases    Aortic valve endocarditis    Pleural effusion    Aspiration into airway    Elevated brain natriuretic peptide (BNP) level 11/30/2020   Elevated troponin 11/30/2020   Hypokalemia  11/30/2020   Hypomagnesemia 11/30/2020   Normocytic anemia 11/30/2020   Dyspnea    Respiratory failure ([place])    History of ETT    Infected defibrillator ([place])    Bacteremia    Central line infection    Acute respiratory failure with hypoxia and hypercapnia ([place]) 11/29/2020   Centrilobular emphysema ([place]) 08/09/2020   Benign neoplasm of colon    Heart failure ([place])  12/31/2019   Ischemic cardiomyopathy 08/31/2019   Cardiogenic shock (HCC)    STEMI (ST elevation myocardial infarction) ([place]) 04/27/2019   Acute anterior wall MI ([place]) 04/27/2019   Mild aortic stenosis 12/02/2012   Ankylosis of sacroiliac joint 03/08/2011   TOBACCO ABUSE 03/13/2009   Hyperlipidemia with target LDL less than 70 06/07/2008   Essential hypertension 06/07/2008   CAD (coronary artery disease) 06/07/2008   BENIGN PROSTATIC HYPERTROPHY 06/07/2008   History of colon polyps 06/07/2008   NEPHROLITHIASIS, HX OF 06/07/2008   PCP:  [name], MD Pharmacy:   [id number] - [place], [place] - [address] [place] [address] Phone: [phone] Fax: [phone]  [id number] - [place], [place] - [address] [place] [address] Phone: [phone] Fax: [phone]  [facility] [id number] [place] [address] Phone: [phone] Fax: [phone]     Social Determinants of Health (SDOH) Interventions    Readmission Risk Interventions No flowsheet data found.

## 2021-03-21 NOTE — Plan of Care (Signed)
  Problem: Health Behavior/Discharge Planning: Goal: Ability to manage health-related needs will improve Outcome: Not Progressing   Problem: Clinical Measurements: Goal: Ability to maintain clinical measurements within normal limits will improve Outcome: Not Progressing Goal: Respiratory complications will improve Outcome: Not Progressing   Problem: Coping: Goal: Level of anxiety will decrease Outcome: Not Progressing

## 2021-03-21 NOTE — Plan of Care (Signed)

## 2021-03-21 NOTE — Progress Notes (Signed)
Mobility Specialist Progress Note    03/21/21 1039  Mobility  Activity Contraindicated/medical hold   Pt not feeling well and had a rough night. Will f/u this pm to see if he is willing to mobilize.   Hildred Alamin Mobility Specialist  Mobility Specialist Phone: [phone]

## 2021-03-21 NOTE — Progress Notes (Addendum)
[facility]  PROGRESS NOTE  [name]:[id number] DOB: 03-13-1956 DOA: 03/07/2021 PCP: [name], MD   Brief HPI:   65 year old male with past medical history of coronary artery disease, chronic systolic heart failure with AICD extracted on 12/01/2020 due to infection, completed antibiotics on 01/15/2021 with Rocephin, moderate aortic stenosis, right upper extremity DVT came to ED with acute onset of dyspnea.  Was found to be hypoxemic, placed on 2 L/min of oxygen found to be in acute decompensated heart failure.  Cardiology was consulted.  Significant Events: 10/29 Co-ox 48%, started on milrinone 0.25.  10/30 IV Antibiotics stopped. Blood cultures no growth for 5 days.  10/31 started on augmentin. Milrinone cut back to 0.125 mcg.  03/12/2021 started on midodrine  03/24/2021 started back on milrinone   Antibiotics: 03/07/2021 IV Vanco and cefepime till 03/10/2021 03/11/2021 Augmentin   Microbiology data: 03/07/2021 blood culture: Remain negative till date   Procedures: None  Subjective   Patient seen, complains of shortness of breath.  Last night became dyspneic and was orthopneic.  Sat up in the recliner.   Assessment/Plan:   Pneumonia/sepsis -Initially started on vancomycin and cefepime which was stopped on 10/30 -High-resolution CT scan of the chest showed bronchopneumonia, so ID started him back on Augmentin for 7 more days -At this time he is off antibiotics   Acute respiratory failure with hypoxemia due to acute on chronic systolic heart failure -Last 2D echo in July 2022 showed EF 20% -Currently on ivabradine, Lasix, digoxin, midodrine, milrinone -Last night became short of breath, received IV Lasix -Cardiology had earlier switched him to p.o. 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  Mild hyponatremia -Secondary to CHF     Scheduled medications:    (feeding supplement) PROSource Plus  30 mL Oral TID BM   apixaban  5 mg Oral BID   atorvastatin  80 mg Oral QPM   Chlorhexidine Gluconate Cloth  6 each Topical Daily   digoxin  0.125 mg Oral Daily   diphenhydrAMINE  50 mg Oral QHS   ezetimibe  10 mg Oral Daily   feeding supplement  237 mL Oral BID BM   furosemide  80 mg Intravenous BID   gabapentin  200 mg Oral TID   ivabradine  7.5 mg Oral BID WC   lidocaine  1 patch Transdermal Q24H   melatonin  5 mg Oral QHS   midodrine  10 mg Oral TID WC   multivitamin with minerals  1 tablet Oral Daily   pantoprazole  40 mg Oral Daily   potassium chloride  40 mEq Oral BID   sertraline  50 mg Oral Daily   sodium chloride flush  10-40 mL Intracatheter Q12H   tamsulosin  0.4 mg Oral Daily     Data Reviewed:   CBG:  No results for input(s): GLUCAP in the last 168 hours.  SpO2: 94 % O2 Flow Rate (L/min): 3 L/min    Vitals:   03/21/21 0500 03/21/21 0752 03/21/21 0851 03/21/21 1633  BP:  105/69  98/62  Pulse:  92 88 91  Resp:  17 20 (!)  22  Temp:      TempSrc:      SpO2:  95% 97% 94%  Weight: 77 kg     Height:         Intake/Output Summary (Last 24 hours) at 03/21/2021 1702 Last data filed at 03/21/2021 1200 Gross per 24 hour  Intake 524.23 ml  Output 2775 ml  Net -2250.77 ml    11/07 1901 - 11/09 0700 In: 2472.9 [P.O.:1907; I.V.:97.9] Out: 2450 [Urine:2450]  Filed Weights   03/19/21 0323 03/20/21 0410 03/21/21 0500  Weight: 77.2 kg 75.5 kg 77 kg    Data Reviewed: Basic Metabolic Panel: Recent Labs  Lab 03/15/21 0415 03/16/21 0320 03/17/21 0503 03/18/21 0538  03/19/21 0510 03/20/21 0419 03/21/21 0132  NA 133* 133* 133* 135 134* 134* 134*  K 3.8 3.9 3.6 3.6 3.9 3.8 3.8  CL 101 101 102 98 99 95* 97*  CO2 27 28 26 29 28 31 30   GLUCOSE 112* 128* 111* 109* 116* 106* 107*  BUN 13 14 14 16 17 22 22   CREATININE 0.58* 0.54* 0.49* 0.56* 0.59* 0.65 0.65  CALCIUM 8.8* 8.9 8.4* 8.9 8.8* 9.0 8.9  MG 2.0 2.0 2.0 2.0 2.1  --   --    Liver Function Tests: No results for input(s): AST, ALT, ALKPHOS, BILITOT, PROT, ALBUMIN in the last 168 hours. No results for input(s): LIPASE, AMYLASE in the last 168 hours. No results for input(s): AMMONIA in the last 168 hours. CBC: Recent Labs  Lab 03/15/21 0415 03/16/21 0320 03/17/21 0503 03/18/21 0538  WBC 12.0* 11.6* 11.4* 10.4  HGB 9.1* 9.0* 9.0* 9.1*  HCT 29.1* 29.2* 29.3* 29.6*  MCV 78.4* 78.5* 79.0* 79.4*  PLT 323 328 354 349   Cardiac Enzymes: No results for input(s): CKTOTAL, CKMB, CKMBINDEX, TROPONINI in the last 168 hours. BNP (last 3 results) Recent Labs    11/30/20 0144 01/08/21 0731 01/18/21 1629  BNP 2,653.1* 734.2* 607.9*    ProBNP (last 3 results) Recent Labs    03/23/20 0922  PROBNP 520*    CBG: No results for input(s): GLUCAP in the last 168 hours.     Radiology Reports  DG CHEST PORT 1 VIEW  Result Date: 03/21/2021 CLINICAL DATA:  Shortness of breath EXAM: PORTABLE CHEST 1 VIEW COMPARISON:  03/18/2021 FINDINGS: Stimulator battery pack in the right chest with leads extending into the neck. Hazy and patchy opacity over the bilateral chest with small pleural effusions. Borderline heart size. Left PICC with tip at the distal SVC. Ankylosing spondylitis with costovertebral ossification bilaterally. IMPRESSION: Stable airspace disease and small pleural effusions, likely volume overload and pneumonia. Electronically Signed   By: Jorje Guild M.D.   On: 03/21/2021 04:22   ECHOCARDIOGRAM COMPLETE  Result Date: 03/20/2021    ECHOCARDIOGRAM REPORT   Patient Name:   Brandon [name]  Date of Exam: 03/20/2021 Medical Rec #:  [id number]      Height:       72.0 in Accession #:    [id number]     Weight:       166.4 lb Date of Birth:  [birth year]       BSA:          1.971 m Patient Age:    32 years       BP:           107/81 mmHg Patient Gender: M              HR:  79 bpm. Exam Location:  Inpatient Procedure: 2D Echo, Cardiac Doppler, Color Doppler and Intracardiac            Opacification Agent Indications:    Pre-LVAD evaluation  History:        Patient has prior history of Echocardiogram examinations, most                 recent 11/30/2020. CHF, Previous Myocardial Infarction and CAD,                 Defibrillator; Aortic Valve Disease.  Sonographer:    Merrie Roof RDCS Referring Phys: [place]  1. There is no left ventricular thrombus (Definity contrast). There is apical akinesis/mild dyskinesis. There akinesis of the anterior and anteroseptal walls and severe hypokinesis of the inferior wall. Left ventricular ejection fraction, by estimation,  is 20 to 25%. The left ventricle has severely decreased function. The left ventricle demonstrates regional wall motion abnormalities (see scoring diagram/findings for description). The left ventricular internal cavity size was moderately dilated. There is mild eccentric left ventricular hypertrophy. Left ventricular diastolic parameters are consistent with Grade II diastolic dysfunction (pseudonormalization). Elevated left atrial pressure.  2. Right ventricular systolic function is normal. The right ventricular size is normal.  3. Left atrial size was severely dilated.  4. The mitral valve is degenerative. Mild to moderate mitral valve regurgitation. No evidence of mitral stenosis.  5. The aortic valve is tricuspid. There is moderate calcification of the aortic valve. There is moderate thickening of the aortic valve. Aortic valve regurgitation is moderate. Aortic valve mean gradient measures 20.0 mmHg. Aortic valve Vmax  measures 2.99 m/s.  6. The inferior vena cava is normal in size with greater than 50% respiratory variability, suggesting right atrial pressure of 3 mmHg. FINDINGS  Left Ventricle: There is no left ventricular thrombus (Definity contrast). There is apical akinesis/mild dyskinesis. There akinesis of the anterior and anteroseptal walls and severe hypokinesis of the inferior wall. Left ventricular ejection fraction, by estimation, is 20 to 25%. The left ventricle has severely decreased function. The left ventricle demonstrates regional wall motion abnormalities. Definity contrast agent was given IV to delineate the left ventricular endocardial borders. The left ventricular internal cavity size was moderately dilated. There is mild eccentric left ventricular hypertrophy. Left ventricular diastolic parameters are consistent with Grade II diastolic dysfunction (pseudonormalization). Elevated left atrial pressure.  LV Wall Scoring: The apical septal segment and apex are dyskinetic. The entire anterior wall, mid anteroseptal segment, apical lateral segment, and apical inferior segment are akinetic. The inferior wall, basal anteroseptal segment, mid inferoseptal segment, and basal inferoseptal segment are hypokinetic. The antero-lateral wall and posterior wall are normal. Right Ventricle: The right ventricular size is normal. No increase in right ventricular wall thickness. Right ventricular systolic function is normal. Left Atrium: Left atrial size was severely dilated. Right Atrium: Right atrial size was normal in size. Pericardium: There is no evidence of pericardial effusion. Mitral Valve: The mitral valve is degenerative in appearance. Mild to moderate mitral annular calcification. Mild to moderate mitral valve regurgitation, with centrally-directed jet. No evidence of mitral valve stenosis. Tricuspid Valve: The tricuspid valve is normal in structure. Tricuspid valve regurgitation is not demonstrated. Aortic Valve: The  aortic valve is tricuspid. There is moderate calcification of the aortic valve. There is moderate thickening of the aortic valve. Aortic valve regurgitation is moderate. Aortic valve mean gradient measures 20.0 mmHg. Aortic valve peak gradient measures 35.8 mmHg. Aortic valve area, by VTI  measures 1.13 cm. Pulmonic Valve: The pulmonic valve was normal in structure. Pulmonic valve regurgitation is not visualized. Aorta: The aortic root is normal in size and structure. Venous: The inferior vena cava is normal in size with greater than 50% respiratory variability, suggesting right atrial pressure of 3 mmHg. IAS/Shunts: No atrial level shunt detected by color flow Doppler.  LEFT VENTRICLE PLAX 2D LVIDd:         6.00 cm   Diastology LVIDs:         5.50 cm   LV e' medial:    8.16 cm/s LV PW:         1.30 cm   LV E/e' medial:  19.0 LV IVS:        1.40 cm   LV e' lateral:   10.90 cm/s LVOT diam:     2.00 cm   LV E/e' lateral: 14.2 LV SV:         63 LV SV Index:   32 LVOT Area:     3.14 cm  RIGHT VENTRICLE RV Basal diam:  4.10 cm RV Mid diam:    4.00 cm LEFT ATRIUM              Index        RIGHT ATRIUM           Index LA diam:        4.90 cm  2.49 cm/m   RA Area:     19.80 cm LA Vol (A2C):   124.0 ml 62.92 ml/m  RA Volume:   62.10 ml  31.51 ml/m LA Vol (A4C):   83.4 ml  42.32 ml/m LA Biplane Vol: 104.0 ml 52.78 ml/m  AORTIC VALVE AV Area (Vmax):    1.11 cm AV Area (Vmean):   1.09 cm AV Area (VTI):     1.13 cm AV Vmax:           299.00 cm/s AV Vmean:          208.000 cm/s AV VTI:            0.562 m AV Peak Grad:      35.8 mmHg AV Mean Grad:      20.0 mmHg LVOT Vmax:         106.00 cm/s LVOT Vmean:        72.100 cm/s LVOT VTI:          0.202 m LVOT/AV VTI ratio: 0.36  AORTA Ao Root diam: 3.60 cm MITRAL VALVE MV Area (PHT): 4.49 cm     SHUNTS MV Decel Time: 169 msec     Systemic VTI:  0.20 m MV E velocity: 155.00 cm/s  Systemic Diam: 2.00 cm MV A velocity: 81.90 cm/s MV E/A ratio:  1.89 Mihai Croitoru MD  Electronically signed by Sanda Klein MD Signature Date/Time: 03/20/2021/6:39:08 PM    Final        Antibiotics: Anti-infectives (From admission, onward)    Start     Dose/Rate Route Frequency Ordered Stop   03/12/21 1000  amoxicillin-clavulanate (AUGMENTIN) 875-125 MG per tablet 1 tablet        1 tablet Oral Every 12 hours 03/12/21 0832 03/19/21 0935   03/08/21 0700  vancomycin (VANCOREADY) IVPB 1250 mg/250 mL  Status:  Discontinued        1,250 mg 166.7 mL/hr over 90 Minutes Intravenous Every 12 hours 03/07/21 1801 03/09/21 1320   03/08/21 0000  ceFEPIme (MAXIPIME) 2 g in sodium chloride 0.9 % 100 mL IVPB  Status:  Discontinued        2 g 200 mL/hr over 30 Minutes Intravenous Every 8 hours 03/07/21 1800 03/10/21 1201   03/07/21 1500  vancomycin (VANCOREADY) IVPB 1500 mg/300 mL        1,500 mg 150 mL/hr over 120 Minutes Intravenous  Once 03/07/21 1455 03/08/21 0118   03/07/21 1500  ceFEPIme (MAXIPIME) 2 g in sodium chloride 0.9 % 100 mL IVPB        2 g 200 mL/hr over 30 Minutes Intravenous  Once 03/07/21 1455 03/07/21 1628         DVT prophylaxis: Apixaban  Code Status: Full code  Family Communication: No family at bedside   Consultants: Cardiology  Procedures:     Objective    Physical Examination:  General-appears in no acute distress Heart-S1-S2, regular, no murmur auscultated Lungs-bibasilar crackles Abdomen-soft, nontender, no organomegaly Extremities-no edema in the lower extremities Neuro-alert, oriented x3, no focal deficit noted  Status is: Inpatient  Dispo: The patient is from: Home              Anticipated d/c is to: Home              Anticipated d/c date is: 03/25/2021              Patient currently not stable for discharge  Barrier to discharge-ongoing treatment of acute systolic heart failure  [id number] Labs  No results for input(s): DDIMER, FERRITIN, LDH, CRP in the last 72 hours.  Lab Results  Component Value Date   [place]  NEGATIVE 03/07/2021   [place] NEGATIVE 01/08/2021   [place] NEGATIVE 12/04/2020   [place] NEGATIVE 11/14/2020            No results found for this or any previous visit (from the past 240 hour(s)).  Oswald Hillock   [facility] If 7PM-7AM, please contact night-coverage at [email or web address], Office  [phone]   03/21/2021, 5:02 PM  LOS: 13 days

## 2021-03-21 NOTE — Progress Notes (Signed)
CARDIAC REHAB PHASE I   Went to offer to walk with pt. Pt discouraged and having a rough day. Pt helped into bed. Questions and concerns addressed. Provided support and encouragement. Pt and sister appreciative of the care he has received. Will continue to follow pt and encourage ambulation as able.  [phone] Rufina Falco, RN BSN 03/21/2021 2:23 PM

## 2021-03-21 NOTE — Progress Notes (Signed)
[facility] night cross cover note:  Was notified by patient's RN that Patient feeling more short of breath tonight, and noting some pleuritic discomfort on the left portion of the chest , limited to deep inspiration.  While patient does not overtly endorse orthopnea, he does report that he feels more comfortable from a breathing standpoint sitting at bedside chair then attempting to lay in bed.  Vital signs appears stable.  With the patient maintaining oxygen saturations in the high 90s on 3 L nasal cannula, which is the level of supplemental oxygen that he has been on throughout the last few shifts, per my discussions with RN.  For this night shift, per RN, I&O's: 500 in; 200 out (urine).   Per chart review, this is a 65 year old male who is here with acute hypoxic respiratory failure due to acute on chronic systolic heart failure.  Cardiology has been following, managing the milrinone drip.  He has a history of right upper extremity DVT, for which she is anticoagulated on Eliquis, and was also treated for pneumonia earlier this hospitalization, with course of IV vancomycin and cefepime note, then Augmentin.  To further evaluate tonight's  worsening shortness of breath, i ordered stat chest x-ray and EKG, with chest x-ray showing stable airspace disease and small and small pleural effusions, likely volume overload/pneumonia.  It appears that the patient received Lasix 40 mg IV x2 doses on 03/19/2021 as well as 40 mg of p.o. Lasix.  On 03/20/2021 he received no additional IV Lasix, but received increased dose of oral Lasix 80 mg PO qday. In comparing the last 2 days, his effective po equivalent dose of Lasix on 03/19/2021 was 200 mg relative to 80 mg on 03/20/2021, with interval continuation of milrinone.  Consequently, there is suspicion for his acute on chronic systolic heart failure leading to his worsening shortness of breath this evening.  Consequently, and given reported good response to previous IV Lasix doses  on 03/20/2019, I ordered a one-time additional dose of Lasix 40 mg IV, and have also ordered procalcitonin level. Acute PE would appear to be less likely at this time given good compliance with Eliquis.     Babs Bertin, DO Hospitalist

## 2021-03-21 NOTE — Progress Notes (Signed)
Brief VAD coordinator rounding note:   Pt sitting up in recliner upon my arrival; sister at bedside. He shares with me that he had a difficult night, developed orthopnea and chest discomfort. Received IV Lasix overnight with minimal improvement. Received Lasix 80 mg IV this morning with 1600 out so far today. Scheduled to receive a second dose of Lasix at 1800. Reports he feels "40 % better" but is still unable to lay down. He has not been able to ambulate in the hall today.  Will continue to monitor closely for VAD implant readiness.  Emerson Monte RN [place] Coordinator  Office: [phone]  24/7 Pager: [phone]

## 2021-03-22 ENCOUNTER — Encounter (HOSPITAL_COMMUNITY): Payer: Self-pay | Admitting: Internal Medicine

## 2021-03-22 ENCOUNTER — Other Ambulatory Visit: Payer: Self-pay

## 2021-03-22 DIAGNOSIS — I5022 Chronic systolic (congestive) heart failure: Secondary | ICD-10-CM | POA: Diagnosis not present

## 2021-03-22 DIAGNOSIS — J690 Pneumonitis due to inhalation of food and vomit: Secondary | ICD-10-CM | POA: Diagnosis not present

## 2021-03-22 DIAGNOSIS — J9621 Acute and chronic respiratory failure with hypoxia: Secondary | ICD-10-CM | POA: Diagnosis not present

## 2021-03-22 DIAGNOSIS — I10 Essential (primary) hypertension: Secondary | ICD-10-CM | POA: Diagnosis not present

## 2021-03-22 LAB — COOXEMETRY PANEL
Carboxyhemoglobin: 1.8 % — ABNORMAL HIGH (ref 0.5–1.5)
Methemoglobin: 0.7 % (ref 0.0–1.5)
O2 Saturation: 60.3 %
Total hemoglobin: 9.3 g/dL — ABNORMAL LOW (ref 12.0–16.0)

## 2021-03-22 LAB — BASIC METABOLIC PANEL
Anion gap: 9 (ref 5–15)
Anion gap: 11 (ref 5–15)
BUN: 20 mg/dL (ref 8–23)
BUN: 23 mg/dL (ref 8–23)
CO2: 30 mmol/L (ref 22–32)
CO2: 28 mmol/L (ref 22–32)
Calcium: 9.1 mg/dL (ref 8.9–10.3)
Calcium: 8.6 mg/dL — ABNORMAL LOW (ref 8.9–10.3)
Chloride: 94 mmol/L — ABNORMAL LOW (ref 98–111)
Chloride: 92 mmol/L — ABNORMAL LOW (ref 98–111)
Creatinine, Ser: 0.6 mg/dL — ABNORMAL LOW (ref 0.61–1.24)
Creatinine, Ser: 0.74 mg/dL (ref 0.61–1.24)
GFR, Estimated: >60 mL/min (ref >60)
GFR, Estimated: >60 mL/min (ref >60)
Glucose, Bld: 125 mg/dL — ABNORMAL HIGH (ref 70–99)
Glucose, Bld: 275 mg/dL — ABNORMAL HIGH (ref 70–99)
Potassium: 3.1 mmol/L — ABNORMAL LOW (ref 3.5–5.1)
Potassium: 3.2 mmol/L — ABNORMAL LOW (ref 3.5–5.1)
Sodium: 133 mmol/L — ABNORMAL LOW (ref 135–145)
Sodium: 131 mmol/L — ABNORMAL LOW (ref 135–145)

## 2021-03-22 LAB — MAGNESIUM: Magnesium: 1.9 mg/dL (ref 1.7–2.4)

## 2021-03-22 MED ORDER — AMIODARONE LOAD VIA INFUSION
150.0000 mg | Freq: Once | INTRAVENOUS | Status: AC
  Administered 2021-03-22: 150 mg via INTRAVENOUS
  Filled 2021-03-22: qty 83.34

## 2021-03-22 MED ORDER — POTASSIUM CHLORIDE 10 MEQ/100ML IV SOLN
10.0000 meq | INTRAVENOUS | Status: AC
  Administered 2021-03-22 (×2): 10 meq via INTRAVENOUS
  Filled 2021-03-22 (×2): qty 100

## 2021-03-22 MED ORDER — AMIODARONE HCL IN DEXTROSE 360-4.14 MG/200ML-% IV SOLN
60.0000 mg/h | INTRAVENOUS | Status: AC
  Administered 2021-03-22: 60 mg/h via INTRAVENOUS

## 2021-03-22 MED ORDER — AMIODARONE HCL IN DEXTROSE 360-4.14 MG/200ML-% IV SOLN
30.0000 mg/h | INTRAVENOUS | Status: DC
  Administered 2021-03-22 – 2021-04-11 (×42): 30 mg/h via INTRAVENOUS
  Filled 2021-03-22 (×10): qty 200
  Filled 2021-03-22: qty 400
  Filled 2021-03-22 (×30): qty 200

## 2021-03-22 MED ORDER — AMIODARONE HCL IN DEXTROSE 360-4.14 MG/200ML-% IV SOLN
60.0000 mg/h | INTRAVENOUS | Status: DC
  Administered 2021-03-22: 60 mg/h via INTRAVENOUS

## 2021-03-22 MED ORDER — AMIODARONE HCL IN DEXTROSE 360-4.14 MG/200ML-% IV SOLN
INTRAVENOUS | Status: AC
  Filled 2021-03-22: qty 200

## 2021-03-22 MED ORDER — POTASSIUM CHLORIDE CRYS ER 20 MEQ PO TBCR
40.0000 meq | EXTENDED_RELEASE_TABLET | Freq: Once | ORAL | Status: AC
  Administered 2021-03-22: 40 meq via ORAL
  Filled 2021-03-22: qty 2

## 2021-03-22 MED ORDER — POTASSIUM CHLORIDE CRYS ER 20 MEQ PO TBCR
20.0000 meq | EXTENDED_RELEASE_TABLET | Freq: Once | ORAL | Status: AC
  Administered 2021-03-22: 20 meq via ORAL
  Filled 2021-03-22: qty 1

## 2021-03-22 MED ORDER — AMIODARONE IV BOLUS ONLY 150 MG/100ML
150.0000 mg | Freq: Once | INTRAVENOUS | Status: AC
  Administered 2021-03-22: 150 mg via INTRAVENOUS
  Filled 2021-03-22: qty 100

## 2021-03-22 NOTE — Progress Notes (Signed)
Pt in NSR from 0417 until 0431   03/22/21 0430  Assess: MEWS Score  BP (!) 89/60  Pulse Rate 82  ECG Heart Rate 85  Resp 19  SpO2 92 %  Assess: MEWS Score  MEWS Temp 0  MEWS Systolic 1  MEWS Pulse 0  MEWS RR 0  MEWS LOC 0  MEWS Score 1  MEWS Score Color Green  Assess: if the MEWS score is Yellow or Red  Were vital signs taken at a resting state? Yes  Focused Assessment No change from prior assessment  Early Detection of Sepsis Score *See Row Information* Low  MEWS guidelines implemented *See Row Information* No, previously red, continue vital signs every 4 hours

## 2021-03-22 NOTE — Progress Notes (Signed)
   03/22/21 0327  Assess: MEWS Score  Temp (!) 97.5 F (36.4 C)  BP 101/70  Pulse Rate (!) 141  ECG Heart Rate (!) 143  Resp (!) 27  Assess: MEWS Score  MEWS Temp 0  MEWS Systolic 0  MEWS Pulse 3  MEWS RR 2  MEWS LOC 0  MEWS Score 5  MEWS Score Color Red  Assess: if the MEWS score is Yellow or Red  Were vital signs taken at a resting state? Yes  Focused Assessment Change from prior assessment (see assessment flowsheet)  Early Detection of Sepsis Score *See Row Information* Low  MEWS guidelines implemented *See Row Information* Yes  Treat  MEWS Interventions Escalated (See documentation below)  Pt in Afib RVR. To completed EKG to confirm Afib RVR and then notify MD

## 2021-03-22 NOTE — Progress Notes (Signed)
   03/22/21 0509  Vitals  Pulse Rate 93  ECG Heart Rate 97  Resp 16  Out of A-Fib RVR

## 2021-03-22 NOTE — Progress Notes (Signed)
Pt K+ 3.2 at 1830. [facility] paged via [place] for additional doses. Ray Yocum notified as incoming RN.

## 2021-03-22 NOTE — Progress Notes (Signed)
[facility]  PROGRESS NOTE  Brandon [name]:[id number] DOB: 02-20-1956 DOA: 03/07/2021 PCP: [name], MD   Brief HPI:   65 year old male with past medical history of coronary artery disease, chronic systolic heart failure with AICD extracted on 12/01/2020 due to infection, completed antibiotics on 01/15/2021 with Rocephin, moderate aortic stenosis, right upper extremity DVT came to ED with acute onset of dyspnea.  Was found to be hypoxemic, placed on 2 L/min of oxygen found to be in acute decompensated heart failure.  Cardiology was consulted.  Significant Events: 10/29 Co-ox 48%, started on milrinone 0.25.  10/30 IV Antibiotics stopped. Blood cultures no growth for 5 days.  10/31 started on augmentin. Milrinone cut back to 0.125 mcg.  03/12/2021 started on midodrine  03/24/2021 started back on milrinone   Antibiotics: 03/07/2021 IV Vanco and cefepime till 03/10/2021 03/11/2021 Augmentin   Microbiology data: 03/07/2021 blood culture: Remain negative till date   Procedures: None  Subjective   Patient seen and examined, went into A. fib last night.  Started on amiodarone.  Currently in normal sinus rhythm.  Breathing has improved with IV Lasix.   Assessment/Plan:   Pneumonia/sepsis -Initially started on vancomycin and cefepime which was stopped on 10/30 -High-resolution CT scan of the chest showed bronchopneumonia, so ID started him back on Augmentin for 7 more days -At this time he is off antibiotics   Acute respiratory failure with hypoxemia due to acute on chronic systolic heart failure -Last 2D echo in July 2022 showed EF 20% -Currently on ivabradine, Lasix, digoxin, midodrine, milrinone -Last night became short of breath, received IV Lasix -Cardiology had earlier switched him to p.o. 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 fib last night -Started on amiodarone infusion -Converted back to normal sinus rhythm -Patient is on anticoagulation with apixaban  Hypokalemia -Potassium was 3.1 -Potassium replaced -Follow BMP in am   Scheduled medications:    (feeding supplement) PROSource Plus  30 mL Oral TID BM   apixaban  5 mg Oral BID   atorvastatin  80 mg Oral QPM   Chlorhexidine Gluconate Cloth  6 each Topical Daily   digoxin  0.125 mg Oral Daily   diphenhydrAMINE  50 mg Oral QHS   ezetimibe  10 mg Oral Daily   feeding supplement  237 mL Oral BID BM   furosemide  80 mg Intravenous BID   gabapentin  200 mg Oral TID   lidocaine  1 patch Transdermal Q24H   melatonin  5 mg Oral QHS   midodrine  10 mg Oral TID WC   multivitamin with minerals  1 tablet Oral Daily   pantoprazole  40 mg Oral Daily   potassium chloride  40 mEq Oral BID   sertraline  50 mg Oral Daily   sodium chloride flush  10-40 mL Intracatheter Q12H   tamsulosin  0.4 mg Oral Daily     Data Reviewed:   CBG:  No results for input(s): GLUCAP in the last 168 hours.  SpO2: 91 % O2 Flow Rate (L/min):  5 L/min    Vitals:   03/22/21 1033 03/22/21 1100 03/22/21 1101 03/22/21 1341  BP: 91/62 92/62    Pulse: 83 83 86 88  Resp: (!) 30 (!) 23  (!) 26  Temp:  97.7 F (36.5 C)    TempSrc:  Oral    SpO2: 94% 94%  91%  Weight:      Height:         Intake/Output Summary (Last 24 hours) at 03/22/2021 1400 Last data filed at 03/22/2021 1314 Gross per 24 hour  Intake 595.88 ml  Output 1625 ml  Net -1029.12 ml    11/08 1901 - 11/10 0700 In: 716.4  [P.O.:480; I.V.:229.8] Out: 2800 [Urine:2800]  Filed Weights   03/19/21 0323 03/20/21 0410 03/21/21 0500  Weight: 77.2 kg 75.5 kg 77 kg    Data Reviewed: Basic Metabolic Panel: Recent Labs  Lab 03/16/21 0320 03/17/21 0503 03/18/21 0538 03/19/21 0510 03/20/21 0419 03/21/21 0132 03/22/21 0425 03/22/21 0630  NA 133* 133* 135 134* 134* 134* 133*  --   K 3.9 3.6 3.6 3.9 3.8 3.8 3.1*  --   CL 101 102 98 99 95* 97* 94*  --   CO2 28 26 29 28 31 30 30   --   GLUCOSE 128* 111* 109* 116* 106* 107* 125*  --   BUN 14 14 16 17 22 22 20   --   CREATININE 0.54* 0.49* 0.56* 0.59* 0.65 0.65 0.60*  --   CALCIUM 8.9 8.4* 8.9 8.8* 9.0 8.9 9.1  --   MG 2.0 2.0 2.0 2.1  --   --   --  1.9   Liver Function Tests: No results for input(s): AST, ALT, ALKPHOS, BILITOT, PROT, ALBUMIN in the last 168 hours. No results for input(s): LIPASE, AMYLASE in the last 168 hours. No results for input(s): AMMONIA in the last 168 hours. CBC: Recent Labs  Lab 03/16/21 0320 03/17/21 0503 03/18/21 0538  WBC 11.6* 11.4* 10.4  HGB 9.0* 9.0* 9.1*  HCT 29.2* 29.3* 29.6*  MCV 78.5* 79.0* 79.4*  PLT 328 354 349   Cardiac Enzymes: No results for input(s): CKTOTAL, CKMB, CKMBINDEX, TROPONINI in the last 168 hours. BNP (last 3 results) Recent Labs    11/30/20 0144 01/08/21 0731 01/18/21 1629  BNP 2,653.1* 734.2* 607.9*    ProBNP (last 3 results) Recent Labs    03/23/20 0922  PROBNP 520*    CBG: No results for input(s): GLUCAP in the last 168 hours.     Radiology Reports  DG CHEST PORT 1 VIEW  Result Date: 03/21/2021 CLINICAL DATA:  Shortness of breath EXAM: PORTABLE CHEST 1 VIEW COMPARISON:  03/18/2021 FINDINGS: Stimulator battery pack in the right chest with leads extending into the neck. Hazy and patchy opacity over the bilateral chest with small pleural effusions. Borderline heart size. Left PICC with tip at the distal SVC. Ankylosing spondylitis with costovertebral ossification bilaterally.  IMPRESSION: Stable airspace disease and small pleural effusions, likely volume overload and pneumonia. Electronically Signed   By: Jorje Guild M.D.   On: 03/21/2021 04:22   ECHOCARDIOGRAM COMPLETE  Result Date: 03/20/2021    ECHOCARDIOGRAM REPORT   Patient Name:   Brandon [name] Date of Exam: 03/20/2021 Medical Rec #:  [id number]      Height:       72.0 in Accession #:    [id number]     Weight:       166.4 lb Date of Birth:  1956/04/08  BSA:          1.971 m Patient Age:    76 years       BP:           107/81 mmHg Patient Gender: M              HR:           79 bpm. Exam Location:  Inpatient Procedure: 2D Echo, Cardiac Doppler, Color Doppler and Intracardiac            Opacification Agent Indications:    Pre-LVAD evaluation  History:        Patient has prior history of Echocardiogram examinations, most                 recent 11/30/2020. CHF, Previous Myocardial Infarction and CAD,                 Defibrillator; Aortic Valve Disease.  Sonographer:    Merrie Roof RDCS Referring Phys: [place]  1. There is no left ventricular thrombus (Definity contrast). There is apical akinesis/mild dyskinesis. There akinesis of the anterior and anteroseptal walls and severe hypokinesis of the inferior wall. Left ventricular ejection fraction, by estimation,  is 20 to 25%. The left ventricle has severely decreased function. The left ventricle demonstrates regional wall motion abnormalities (see scoring diagram/findings for description). The left ventricular internal cavity size was moderately dilated. There is mild eccentric left ventricular hypertrophy. Left ventricular diastolic parameters are consistent with Grade II diastolic dysfunction (pseudonormalization). Elevated left atrial pressure.  2. Right ventricular systolic function is normal. The right ventricular size is normal.  3. Left atrial size was severely dilated.  4. The mitral valve is degenerative. Mild to moderate mitral valve  regurgitation. No evidence of mitral stenosis.  5. The aortic valve is tricuspid. There is moderate calcification of the aortic valve. There is moderate thickening of the aortic valve. Aortic valve regurgitation is moderate. Aortic valve mean gradient measures 20.0 mmHg. Aortic valve Vmax measures 2.99 m/s.  6. The inferior vena cava is normal in size with greater than 50% respiratory variability, suggesting right atrial pressure of 3 mmHg. FINDINGS  Left Ventricle: There is no left ventricular thrombus (Definity contrast). There is apical akinesis/mild dyskinesis. There akinesis of the anterior and anteroseptal walls and severe hypokinesis of the inferior wall. Left ventricular ejection fraction, by estimation, is 20 to 25%. The left ventricle has severely decreased function. The left ventricle demonstrates regional wall motion abnormalities. Definity contrast agent was given IV to delineate the left ventricular endocardial borders. The left ventricular internal cavity size was moderately dilated. There is mild eccentric left ventricular hypertrophy. Left ventricular diastolic parameters are consistent with Grade II diastolic dysfunction (pseudonormalization). Elevated left atrial pressure.  LV Wall Scoring: The apical septal segment and apex are dyskinetic. The entire anterior wall, mid anteroseptal segment, apical lateral segment, and apical inferior segment are akinetic. The inferior wall, basal anteroseptal segment, mid inferoseptal segment, and basal inferoseptal segment are hypokinetic. The antero-lateral wall and posterior wall are normal. Right Ventricle: The right ventricular size is normal. No increase in right ventricular wall thickness. Right ventricular systolic function is normal. Left Atrium: Left atrial size was severely dilated. Right Atrium: Right atrial size was normal in size. Pericardium: There is no evidence of pericardial effusion. Mitral Valve: The mitral valve is degenerative in appearance.  Mild to moderate mitral annular calcification. Mild to moderate mitral valve regurgitation, with centrally-directed jet. No evidence of  mitral valve stenosis. Tricuspid Valve: The tricuspid valve is normal in structure. Tricuspid valve regurgitation is not demonstrated. Aortic Valve: The aortic valve is tricuspid. There is moderate calcification of the aortic valve. There is moderate thickening of the aortic valve. Aortic valve regurgitation is moderate. Aortic valve mean gradient measures 20.0 mmHg. Aortic valve peak gradient measures 35.8 mmHg. Aortic valve area, by VTI measures 1.13 cm. Pulmonic Valve: The pulmonic valve was normal in structure. Pulmonic valve regurgitation is not visualized. Aorta: The aortic root is normal in size and structure. Venous: The inferior vena cava is normal in size with greater than 50% respiratory variability, suggesting right atrial pressure of 3 mmHg. IAS/Shunts: No atrial level shunt detected by color flow Doppler.  LEFT VENTRICLE PLAX 2D LVIDd:         6.00 cm   Diastology LVIDs:         5.50 cm   LV e' medial:    8.16 cm/s LV PW:         1.30 cm   LV E/e' medial:  19.0 LV IVS:        1.40 cm   LV e' lateral:   10.90 cm/s LVOT diam:     2.00 cm   LV E/e' lateral: 14.2 LV SV:         63 LV SV Index:   32 LVOT Area:     3.14 cm  RIGHT VENTRICLE RV Basal diam:  4.10 cm RV Mid diam:    4.00 cm LEFT ATRIUM              Index        RIGHT ATRIUM           Index LA diam:        4.90 cm  2.49 cm/m   RA Area:     19.80 cm LA Vol (A2C):   124.0 ml 62.92 ml/m  RA Volume:   62.10 ml  31.51 ml/m LA Vol (A4C):   83.4 ml  42.32 ml/m LA Biplane Vol: 104.0 ml 52.78 ml/m  AORTIC VALVE AV Area (Vmax):    1.11 cm AV Area (Vmean):   1.09 cm AV Area (VTI):     1.13 cm AV Vmax:           299.00 cm/s AV Vmean:          208.000 cm/s AV VTI:            0.562 m AV Peak Grad:      35.8 mmHg AV Mean Grad:      20.0 mmHg LVOT Vmax:         106.00 cm/s LVOT Vmean:        72.100 cm/s LVOT VTI:           0.202 m LVOT/AV VTI ratio: 0.36  AORTA Ao Root diam: 3.60 cm MITRAL VALVE MV Area (PHT): 4.49 cm     SHUNTS MV Decel Time: 169 msec     Systemic VTI:  0.20 m MV E velocity: 155.00 cm/s  Systemic Diam: 2.00 cm MV A velocity: 81.90 cm/s MV E/A ratio:  1.89 Mihai Croitoru MD Electronically signed by Sanda Klein MD Signature Date/Time: 03/20/2021/6:39:08 PM    Final        Antibiotics: Anti-infectives (From admission, onward)    Start     Dose/Rate Route Frequency Ordered Stop   03/12/21 1000  amoxicillin-clavulanate (AUGMENTIN) 875-125 MG per tablet 1 tablet  1 tablet Oral Every 12 hours 03/12/21 0832 03/19/21 0935   03/08/21 0700  vancomycin (VANCOREADY) IVPB 1250 mg/250 mL  Status:  Discontinued        1,250 mg 166.7 mL/hr over 90 Minutes Intravenous Every 12 hours 03/07/21 1801 03/09/21 1320   03/08/21 0000  ceFEPIme (MAXIPIME) 2 g in sodium chloride 0.9 % 100 mL IVPB  Status:  Discontinued        2 g 200 mL/hr over 30 Minutes Intravenous Every 8 hours 03/07/21 1800 03/10/21 1201   03/07/21 1500  vancomycin (VANCOREADY) IVPB 1500 mg/300 mL        1,500 mg 150 mL/hr over 120 Minutes Intravenous  Once 03/07/21 1455 03/08/21 0118   03/07/21 1500  ceFEPIme (MAXIPIME) 2 g in sodium chloride 0.9 % 100 mL IVPB        2 g 200 mL/hr over 30 Minutes Intravenous  Once 03/07/21 1455 03/07/21 1628         DVT prophylaxis: Apixaban  Code Status: Full code  Family Communication: No family at bedside   Consultants: Cardiology  Procedures:     Objective    Physical Examination:  General-appears in no acute distress Heart-S1-S2, regular, no murmur auscultated Lungs-clear to auscultation bilaterally, no wheezing or crackles auscultated Abdomen-soft, nontender, no organomegaly Extremities-no edema in the lower extremities Neuro-alert, oriented x3, no focal deficit noted  Status is: Inpatient  Dispo: The patient is from: Home              Anticipated d/c is to:  Home              Anticipated d/c date is: 03/25/2021              Patient currently not stable for discharge  Barrier to discharge-ongoing treatment of acute systolic heart failure  [id number] Labs  No results for input(s): DDIMER, FERRITIN, LDH, CRP in the last 72 hours.  Lab Results  Component Value Date   [place] NEGATIVE 03/07/2021   [place] NEGATIVE 01/08/2021   [place] NEGATIVE 12/04/2020   [place] NEGATIVE 11/14/2020            No results found for this or any previous visit (from the past 240 hour(s)).  Oswald Hillock   [facility] If 7PM-7AM, please contact night-coverage at [email or web address], Office  [phone]   03/22/2021, 2:00 PM  LOS: 14 days

## 2021-03-22 NOTE — Progress Notes (Signed)
   03/22/21 0345  Assess: MEWS Score  Pulse Rate (!) 130  ECG Heart Rate (!) 128  Resp (!) 27  SpO2 91 %  Assess: MEWS Score  MEWS Temp 0  MEWS Systolic 0  MEWS Pulse 2  MEWS RR 2  MEWS LOC 0  MEWS Score 4  MEWS Score Color Red  Assess: if the MEWS score is Yellow or Red  Were vital signs taken at a resting state? Yes  Focused Assessment Change from prior assessment (see assessment flowsheet)  Early Detection of Sepsis Score *See Row Information* Low  MEWS guidelines implemented *See Row Information* Yes  Notify: Charge Nurse/RN  Name of Charge Nurse/RN Notified Tanya RN  Date Charge Nurse/RN Notified 03/22/21  Time Charge Nurse/RN Notified 0330  Notify: Provider  Provider Name/Title Berna Spare, MD  Date Provider Notified 03/22/21  Time Provider Notified 0330  Notification Type Page  Notification Reason Change in status  Provider response See new orders  Date of Provider Response 03/22/21  Time of Provider Response 0342  Pt in Afib RVR confirmed with ECG - notified covering MD - Dr. Humphrey Rolls, received orders.

## 2021-03-22 NOTE — Progress Notes (Addendum)
Patient ID: Brandon [name], male   DOB: 03/21/1956, 65 y.o.   MRN: [id number]     Advanced Heart Failure Rounding Note  PCP-Cardiologist: Brandon Ruths, MD   Subjective:    Admitted with sepsis. Started on cefepime + vanc, now off abx (ID following). Afebrile.  10/27 started on ivabradine 7.5 mg twice a day and midodrine 5 mg TID 10/29 Co-ox 48%, started on milrinone 0.25.  10/30 IV Antibiotics stopped. Blood cultures no growth for 5 days.  10/31 started on Augmentin for PNA.  11/1 Milrinone off 11/2 Milrinone added back. Midodrine increased to 10 mg TID 11/8 Placed on 80 mg po lasix  11/10 Developed Afib w/ RVR, started on amio gtt   Developed Afib w/ RVR overnight. Placed on amio gtt. When I entered the room, he was still in Afib w/ rates 130s-140s but appears to have converted back to NSR w/ PACs/PVCs. HR upper 90s-110s currently.   Already on Eliquis for h/o DVT.   K 3.1==>currently getting runs of IV KCL  Mg 1.9   Remains on milrinone 0.125. Co-ox 60%. CVP 7.  2.3L in UOP yesterday. Wt up 3 lb.   No palpitations. Complains of pleuritic CP and mild dyspnea. No fever/chills.    Objective:   Weight Range: 77 kg Body mass index is 23.02 kg/m.   Vital Signs:   Temp:  [97.5 F (36.4 C)-99.4 F (37.4 C)] 97.6 F (36.4 C) (11/10 0600) Pulse Rate:  [71-141] 95 (11/10 0715) Resp:  [16-32] 29 (11/10 0715) BP: (89-105)/(60-74) 98/70 (11/10 0715) SpO2:  [89 %-99 %] 93 % (11/10 0600) Last BM Date: 03/17/21  Weight change: Filed Weights   03/19/21 0323 03/20/21 0410 03/21/21 0500  Weight: 77.2 kg 75.5 kg 77 kg    Intake/Output:   Intake/Output Summary (Last 24 hours) at 03/22/2021 0732 Last data filed at 03/22/2021 0600 Gross per 24 hour  Intake 203.88 ml  Output 2250 ml  Net -2046.12 ml     Physical Exam   CVP 7  General:  fatigued appearing, thin WM. No respiratory difficulty. Slightly anxious  HEENT: normal Neck: supple. JVD 8 cm. Carotids 2+ bilat; no  bruits. No lymphadenopathy or thyromegaly appreciated. Cor: PMI nondisplaced. Irregular rhythm, tachy rate. No rubs, gallops or murmurs. Lungs: decreased BS at the bases  Abdomen: soft, nontender, nondistended. No hepatosplenomegaly. No bruits or masses. Good bowel sounds. Extremities: no cyanosis, clubbing, rash, edema Neuro: alert & oriented x 3, cranial nerves grossly intact. moves all 4 extremities w/o difficulty. Affect pleasant.  Telemetry   Afib w/ RVR 120-140s through most of the night, now appears NSR w/ PACs/PVCs HR 90s-110s personally reviewed.   Labs    CBC No results for input(s): WBC, NEUTROABS, HGB, HCT, MCV, PLT in the last 72 hours. Basic Metabolic Panel Recent Labs    03/21/21 0132 03/22/21 0425 03/22/21 0630  NA 134* 133*  --   K 3.8 3.1*  --   CL 97* 94*  --   CO2 30 30  --   GLUCOSE 107* 125*  --   BUN 22 20  --   CREATININE 0.65 0.60*  --   CALCIUM 8.9 9.1  --   MG  --   --  1.9   Liver Function Tests No results for input(s): AST, ALT, ALKPHOS, BILITOT, PROT, ALBUMIN in the last 72 hours.  No results for input(s): LIPASE, AMYLASE in the last 72 hours. Cardiac Enzymes No results for input(s): CKTOTAL, CKMB, CKMBINDEX, TROPONINI in  the last 72 hours.  BNP: BNP (last 3 results) Recent Labs    11/30/20 0144 01/08/21 0731 01/18/21 1629  BNP 2,653.1* 734.2* 607.9*    ProBNP (last 3 results) Recent Labs    03/23/20 0922  PROBNP 520*     D-Dimer No results for input(s): DDIMER in the last 72 hours. Hemoglobin A1C No results for input(s): HGBA1C in the last 72 hours. Fasting Lipid Panel No results for input(s): CHOL, HDL, LDLCALC, TRIG, CHOLHDL, LDLDIRECT in the last 72 hours. Thyroid Function Tests No results for input(s): TSH, T4TOTAL, T3FREE, THYROIDAB in the last 72 hours.  Invalid input(s): FREET3  Other results:   Imaging    No results found.   Medications:     Scheduled Medications:  (feeding supplement) PROSource  Plus  30 mL Oral TID BM   apixaban  5 mg Oral BID   atorvastatin  80 mg Oral QPM   Chlorhexidine Gluconate Cloth  6 each Topical Daily   digoxin  0.125 mg Oral Daily   diphenhydrAMINE  50 mg Oral QHS   ezetimibe  10 mg Oral Daily   feeding supplement  237 mL Oral BID BM   furosemide  80 mg Intravenous BID   gabapentin  200 mg Oral TID   ivabradine  7.5 mg Oral BID WC   lidocaine  1 patch Transdermal Q24H   melatonin  5 mg Oral QHS   midodrine  10 mg Oral TID WC   multivitamin with minerals  1 tablet Oral Daily   pantoprazole  40 mg Oral Daily   potassium chloride  40 mEq Oral BID   sertraline  50 mg Oral Daily   sodium chloride flush  10-40 mL Intracatheter Q12H   tamsulosin  0.4 mg Oral Daily    Infusions:  amiodarone     amiodarone 60 mg/hr (03/22/21 0549)   amiodarone     milrinone 0.125 mcg/kg/min (03/22/21 0300)   potassium chloride 10 mEq (03/22/21 0555)     PRN Medications: acetaminophen **OR** acetaminophen, guaiFENesin, ondansetron **OR** ondansetron (ZOFRAN) IV, sodium chloride flush    Patient Profile   Brandon [name] is a 65 year old with history of smoker, CAD s/p previous MI, HTN, HL, chronic HFeEF, sepsis 11/2020 bacteremia.  Admitted with sepsis. Bld Cx - NGTD    Assessment/Plan    1. Acute on chronic systolic HFr EF - Echo 11/10/04 EF 20-25% RV mildly HK. moderate AS  Mean gradient 13 AVA 1.2 cm2 DI 0.30 - RHC (7/22): EF 20%, low filling pressures. CI 2.3.  - NYHA IV at baseline - 11/2 Restarted 0.125 milrinone  - Co-ox 60%.  - Narrow euvolemic window. CVP 7 today. Even though he has low CVP he has had good response with IV lasix. Continue 80 mg IV lasix bid + K supp. Anticipate using torsemide once volume status improved. Renal function stable.  - No bb with low output HF - Hold ivabradine given AFib   - Continue digoxin. Dig level ok (0.3)  - Continue midodrine 10 mg TID   - Discussed with [name] -  not a candidate for transplant.  - VAD w/u underway.  Will try to support in house with milrinone, PT/OT and adequate nutrition with eye toward VAD in early December. May be able to go home for a short perid of time prior to VAD if he is improving.  - Potential VAD date moved up to 11/25  2. Sepsis - H/o strep bacteremia 11/2020.  ICD extracted 12/04/20.  Barostim was not removed as it is extravascular.  - Had been on IV antibiotics until 01/15/21. Blood cultures negative 01/30/21. - WBCs 11.4 Afebrile.  - Procalcitonin 0.22->0.11 - 10/26 -Blood CX x2  No growth 5 days.  - Ct chest concerning for pneumonia.  - ?PNA => initially on cefepime/vancomycin, now off both.  - Completed Augmentin 11/7  - 11/9 Procalcitonin < 0.1  3. Acute Hypoxic Respiratory Failure  - CXR with pulmonary edema initially, and was diuresed with IV Lasix.  -  CT chest completed 10/30 concerning for bronchopneumonia. On augmentin.  -  recurrent pulmonary edema. Restarted IV lasix.    4. CAD  s/p anterior STEMI (12/20). LHC showed chronically occluded RCA (with L>>R collaterals) and thrombotic occlusion of proximal LAD. Underwent PCI of LAD. - LHC (7/22) with stable CAD.  - No s/s angina - Continue atorvastatin 80. No aspirin d/t anticoagulation.  5. 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 H./O RUE DVT - On eliquis. Will need to change to heparin as we get closer to VAD  7. Severe protein-calorie malnutrition - prealbumin trending up 11>15.  - Continue oral supplement.  - calorie count underway  8. Microcytic anemia - MCV 79.  - Iron Sats 13%.  - Considered IV Iron but want make sure clear of infection. Wil give later this week   9. Atrial Fibrillation w/ RVR - new, developed 11/10. Now back in NSR   - c/w amio gtt 60/hr - Eliquis 5 mg bid  - Keep K > 4.0 and Mg >2.0   10. Hypokalemia - K 3.1, Mg 1.9 - supp K. Repeat BMP this afternoon     Length of Stay: [address], PA-C   03/22/2021, 7:32 AM  Advanced Heart Failure Team Pager [phone] (M-F; 7a - 5p)  Please contact [facility] for night-coverage after hours (5p -7a ) and weekends on [email or web address]  Patient seen and examined with the above-signed Advanced Practice Provider and/or Housestaff. I personally reviewed laboratory data, imaging studies and relevant notes. I independently examined the patient and formulated the important aspects of the plan. I have edited the note to reflect any of my changes or salient points. I have personally discussed the plan with the patient and/or family.  Had rapid AF overnight. Now broke with IV amio. Feeling better. Remains on milrinone. CVP 7-8. Co-ox 60%. Very anxious about AF  General:  Sitting up in bed No resp difficulty HEENT: normal Neck: supple.JVP 7-8 Carotids 2+ bilat; no bruits. No lymphadenopathy or thryomegaly appreciated. Cor: PMI nondisplaced. Regular rate & rhythm. 2/6 AS Lungs: clear Abdomen: soft, nontender, nondistended. No hepatosplenomegaly. No bruits or masses. Good bowel sounds. Extremities: no cyanosis, clubbing, rash, edema Neuro: alert & orientedx3, cranial nerves grossly intact. moves all 4 extremities w/o difficulty. Affect pleasant  Remains tenuous. Continue milrinone and IV amio. Need to continue to keep dry. Reinforced need for continuing increased protein intake and ambulation. Possible VAD on 11/25.   Glori Bickers, MD  12:24 PM

## 2021-03-22 NOTE — Consult Note (Signed)
   [facility] Inpatient Consult   03/22/2021  KORT STETTLER 28-Jul-1955 [id number]  [facility] [ACO] Patient: Medicare CMS DCE  Primary Care Provider:  [name], MD, [facility], [place], an Embedded provider   Patient screened for length of stay  hospitalization with noted high risk score for unplanned readmission risk.   Review of patient's medical record reveals patient is active with the HF team as well per inpatient HF [facility] RN notes.  Plan:  Continue to follow progress and disposition to assess for post hospital care management needs to see ongoing post hospital follow up needs as discussed with the inpatient HF [facility] team.  For questions contact:   Natividad Brood, RN BSN [facility] Liaison  [phone] business mobile phone Toll free office [phone]  Fax number: [phone] [email or web address]

## 2021-03-22 NOTE — Progress Notes (Signed)
CARDIAC REHAB PHASE I   Went to offer to walk with pt. Pt asleep in the bed. Will f/u as able to encourage ambulation as able.  Rufina Falco, RN BSN 03/22/2021 1:01 PM

## 2021-03-22 NOTE — Plan of Care (Signed)
  Problem: Education: Goal: Knowledge of General Education information will improve Description Including pain rating scale, medication(s)/side effects and non-pharmacologic comfort measures Outcome: Progressing   

## 2021-03-22 NOTE — Progress Notes (Signed)
Pt endorsed he hasn't had BM in 4 days. Pt ambulated to bathroom and endorsed that he had to strain significantly. Type 1 stool. Requests stool softeners and PRN Miralax. Will advise oncoming RN.

## 2021-03-22 NOTE — Progress Notes (Signed)
   03/22/21 0539  Assess: MEWS Score  Pulse Rate (!) 123  ECG Heart Rate (!) 123  Resp 20  SpO2 94 %  Assess: MEWS Score  MEWS Temp 0  MEWS Systolic 1  MEWS Pulse 2  MEWS RR 0  MEWS LOC 0  MEWS Score 3  MEWS Score Color Yellow  Notify: Provider  Provider Name/Title Berna Spare, MD  Date Provider Notified 03/22/21  Time Provider Notified [phone]  Notification Type Page (3 times and sent message)  Notification Reason Change in status  Provider response See new orders  Date of Provider Response 03/22/21  Time of Provider Response 0539  Pt having runs of Afib RVR, 3 beats of Vtach seen on monitor by this RN, irregular rhythm, SVT runs continuous, HR from the 120-160's.  MD placing orders for Amio drip and potassium placement

## 2021-03-23 DIAGNOSIS — J9621 Acute and chronic respiratory failure with hypoxia: Secondary | ICD-10-CM | POA: Diagnosis not present

## 2021-03-23 DIAGNOSIS — I10 Essential (primary) hypertension: Secondary | ICD-10-CM | POA: Diagnosis not present

## 2021-03-23 DIAGNOSIS — J690 Pneumonitis due to inhalation of food and vomit: Secondary | ICD-10-CM | POA: Diagnosis not present

## 2021-03-23 DIAGNOSIS — I5022 Chronic systolic (congestive) heart failure: Secondary | ICD-10-CM | POA: Diagnosis not present

## 2021-03-23 LAB — BASIC METABOLIC PANEL
Anion gap: 7 (ref 5–15)
BUN: 25 mg/dL — ABNORMAL HIGH (ref 8–23)
CO2: 31 mmol/L (ref 22–32)
Calcium: 9 mg/dL (ref 8.9–10.3)
Chloride: 96 mmol/L — ABNORMAL LOW (ref 98–111)
Creatinine, Ser: 0.55 mg/dL — ABNORMAL LOW (ref 0.61–1.24)
GFR, Estimated: >60 mL/min (ref >60)
Glucose, Bld: 111 mg/dL — ABNORMAL HIGH (ref 70–99)
Potassium: 4.1 mmol/L (ref 3.5–5.1)
Sodium: 134 mmol/L — ABNORMAL LOW (ref 135–145)

## 2021-03-23 LAB — COOXEMETRY PANEL
Carboxyhemoglobin: 1.2 % (ref 0.5–1.5)
Carboxyhemoglobin: 1.9 % — ABNORMAL HIGH (ref 0.5–1.5)
Methemoglobin: 0.7 % (ref 0.0–1.5)
Methemoglobin: 0.9 % (ref 0.0–1.5)
O2 Saturation: 54.4 %
O2 Saturation: 59.9 %
Total hemoglobin: 9.5 g/dL — ABNORMAL LOW (ref 12.0–16.0)
Total hemoglobin: 9 g/dL — ABNORMAL LOW (ref 12.0–16.0)

## 2021-03-23 MED ORDER — MAGNESIUM SULFATE 2 GM/50ML IV SOLN
2.0000 g | Freq: Once | INTRAVENOUS | Status: AC
  Administered 2021-03-23: 2 g via INTRAVENOUS
  Filled 2021-03-23: qty 50

## 2021-03-23 MED ORDER — DOCUSATE SODIUM 100 MG PO CAPS
100.0000 mg | ORAL_CAPSULE | Freq: Two times a day (BID) | ORAL | Status: DC | PRN
  Administered 2021-03-23 – 2021-03-24 (×2): 100 mg via ORAL
  Filled 2021-03-23 (×2): qty 1

## 2021-03-23 NOTE — Plan of Care (Signed)
Discussed with patient plan of care for the evening, pain management and k-pad with some teach back displayed.  He was uncomfortable with the k-pad in the chair even after several adjustments.  Problem: Health Behavior/Discharge Planning: Goal: Ability to manage health-related needs will improve Outcome: Progressing   Problem: Health Behavior/Discharge Planning: Goal: Ability to manage health-related needs will improve Outcome: Progressing

## 2021-03-23 NOTE — Progress Notes (Signed)
[facility]  PROGRESS NOTE  BEATRICE SEHGAL YTK:[id number] DOB: 12/17/1955 DOA: 03/07/2021 PCP: [name], MD   Brief HPI:   65 year old male with past medical history of coronary artery disease, chronic systolic heart failure with AICD extracted on 12/01/2020 due to infection, completed antibiotics on 01/15/2021 with Rocephin, moderate aortic stenosis, right upper extremity DVT came to ED with acute onset of dyspnea.  Was found to be hypoxemic, placed on 2 L/min of oxygen found to be in acute decompensated heart failure.  Cardiology was consulted.  Significant Events: 10/29 Co-ox 48%, started on milrinone 0.25.  10/30 IV Antibiotics stopped. Blood cultures no growth for 5 days.  10/31 started on augmentin. Milrinone cut back to 0.125 mcg.  03/12/2021 started on midodrine  03/24/2021 started back on milrinone   Antibiotics: 03/07/2021 IV Vanco and cefepime till 03/10/2021 03/11/2021 Augmentin   Microbiology data: 03/07/2021 blood culture: Remain negative till date   Procedures: None  Subjective   Patient seen and examined, breathing is significantly improved.  He is back in normal sinus rhythm.   Assessment/Plan:   Pneumonia/sepsis -Initially started on vancomycin and cefepime which was stopped on 10/30 -High-resolution CT scan of the chest showed bronchopneumonia, so ID started him back on Augmentin for 7 more days -At this time he is off antibiotics   Acute respiratory failure with hypoxemia due to acute on chronic systolic heart failure -Last 2D echo in July 2022 showed EF 20% -Currently on ivabradine, Lasix, digoxin, midodrine, milrinone -Last night became short of breath, received IV Lasix -Cardiology had earlier switched him to p.o. Lasix -Has been restarted on IV Lasix -Cardiology recommending VAD -May go home for short period time before VAD -Potential date for VAD is 11/25  Dyslipidemia -Continue atorvastatin  Right upper extremity DVT with  neuropathy -Continue apixaban, gabapentin  Depression -Continue sertraline  BPH -Continue Flomax  Protein calorie malnutrition -Continue Ensure 3 times a day  Severe low-flow aortic stenosis -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 fib  -Started on amiodarone infusion -Converted back to normal sinus rhythm -Patient is on anticoagulation with apixaban  Hypokalemia -Replete   Scheduled medications:    (feeding supplement) PROSource Plus  30 mL Oral TID BM   apixaban  5 mg Oral BID   atorvastatin  80 mg Oral QPM   Chlorhexidine Gluconate Cloth  6 each Topical Daily   digoxin  0.125 mg Oral Daily   diphenhydrAMINE  50 mg Oral QHS   ezetimibe  10 mg Oral Daily   feeding supplement  237 mL Oral BID BM   furosemide  80 mg Intravenous BID   gabapentin  200 mg Oral TID   lidocaine  1 patch Transdermal Q24H   melatonin  5 mg Oral QHS   midodrine  10 mg Oral TID WC   multivitamin with minerals  1 tablet Oral Daily   pantoprazole  40 mg Oral Daily   potassium chloride  40 mEq Oral BID   sertraline  50 mg Oral Daily   sodium chloride flush  10-40 mL Intracatheter Q12H   tamsulosin  0.4 mg Oral Daily     Data Reviewed:   CBG:  No results for input(s): GLUCAP in the last 168 hours.  SpO2: 97 % O2 Flow Rate (L/min): 5 L/min    Vitals:   03/22/21 2141 03/22/21 2200 03/22/21 2311 03/23/21 0405  BP:  106/67 95/68  Pulse: 92 85 86 85  Resp: (!) 26 20 20 18   Temp:   98.4 F (36.9 C) 98 F (36.7 C)  TempSrc:   Oral Oral  SpO2: 95% 98% 98% 97%  Weight:    75.6 kg  Height:         Intake/Output Summary (Last 24 hours) at 03/23/2021 0809 Last data filed at 03/23/2021 9518 Gross per 24 hour  Intake 1436.57 ml  Output 2001 ml  Net -564.43 ml    11/09 1901 - 11/11 0700 In: 1634.5 [P.O.:870; I.V.:757.8] Out: 2626 [Urine:2625]  Filed Weights   03/20/21 0410  03/21/21 0500 03/23/21 0405  Weight: 75.5 kg 77 kg 75.6 kg    Data Reviewed: Basic Metabolic Panel: Recent Labs  Lab 03/17/21 0503 03/18/21 0538 03/19/21 0510 03/20/21 0419 03/21/21 0132 03/22/21 0425 03/22/21 0630 03/22/21 1816 03/23/21 0415  NA 133* 135 134* 134* 134* 133*  --  131* 134*  K 3.6 3.6 3.9 3.8 3.8 3.1*  --  3.2* 4.1  CL 102 98 99 95* 97* 94*  --  92* 96*  CO2 26 29 28 31 30 30   --  28 31  GLUCOSE 111* 109* 116* 106* 107* 125*  --  275* 111*  BUN 14 16 17 22 22 20   --  23 25*  CREATININE 0.49* 0.56* 0.59* 0.65 0.65 0.60*  --  0.74 0.55*  CALCIUM 8.4* 8.9 8.8* 9.0 8.9 9.1  --  8.6* 9.0  MG 2.0 2.0 2.1  --   --   --  1.9  --   --    Liver Function Tests: No results for input(s): AST, ALT, ALKPHOS, BILITOT, PROT, ALBUMIN in the last 168 hours. No results for input(s): LIPASE, AMYLASE in the last 168 hours. No results for input(s): AMMONIA in the last 168 hours. CBC: Recent Labs  Lab 03/17/21 0503 03/18/21 0538  WBC 11.4* 10.4  HGB 9.0* 9.1*  HCT 29.3* 29.6*  MCV 79.0* 79.4*  PLT 354 349   Cardiac Enzymes: No results for input(s): CKTOTAL, CKMB, CKMBINDEX, TROPONINI in the last 168 hours. BNP (last 3 results) Recent Labs    11/30/20 0144 01/08/21 0731 01/18/21 1629  BNP 2,653.1* 734.2* 607.9*    ProBNP (last 3 results) Recent Labs    03/23/20 0922  PROBNP 520*    CBG: No results for input(s): GLUCAP in the last 168 hours.     Radiology Reports  No results found.     Antibiotics: Anti-infectives (From admission, onward)    Start     Dose/Rate Route Frequency Ordered Stop   03/12/21 1000  amoxicillin-clavulanate (AUGMENTIN) 875-125 MG per tablet 1 tablet        1 tablet Oral Every 12 hours 03/12/21 0832 03/19/21 0935   03/08/21 0700  vancomycin (VANCOREADY) IVPB 1250 mg/250 mL  Status:  Discontinued        1,250 mg 166.7 mL/hr over 90 Minutes Intravenous Every 12 hours 03/07/21 1801 03/09/21 1320   03/08/21 0000  ceFEPIme  (MAXIPIME) 2 g in sodium chloride 0.9 % 100 mL IVPB  Status:  Discontinued        2 g 200 mL/hr over 30 Minutes Intravenous Every 8 hours 03/07/21 1800 03/10/21 1201   03/07/21 1500  vancomycin (VANCOREADY) IVPB 1500 mg/300 mL        1,500 mg 150 mL/hr over 120 Minutes Intravenous  Once 03/07/21 1455 03/08/21 0118   03/07/21 1500  ceFEPIme (MAXIPIME) 2 g in sodium  chloride 0.9 % 100 mL IVPB        2 g 200 mL/hr over 30 Minutes Intravenous  Once 03/07/21 1455 03/07/21 1628         DVT prophylaxis: Apixaban  Code Status: Full code  Family Communication: No family at bedside   Consultants: Cardiology  Procedures:     Objective    Physical Examination:  General-appears in no acute distress Heart-S1-S2, regular, no murmur auscultated Lungs-bibasilar crackles right more than left Abdomen-soft, nontender, no organomegaly Extremities-no edema in the lower extremities Neuro-alert, oriented x3, no focal deficit noted  Status is: Inpatient  Dispo: The patient is from: Home              Anticipated d/c is to: Home              Anticipated d/c date is: 03/29/2021              Patient currently not stable for discharge  Barrier to discharge-ongoing treatment of acute systolic heart failure  [id number] Labs  No results for input(s): DDIMER, FERRITIN, LDH, CRP in the last 72 hours.  Lab Results  Component Value Date   [place] NEGATIVE 03/07/2021   [place] NEGATIVE 01/08/2021   [place] NEGATIVE 12/04/2020   [place] NEGATIVE 11/14/2020            No results found for this or any previous visit (from the past 240 hour(s)).  Oswald Hillock   [facility] If 7PM-7AM, please contact night-coverage at [email or web address], Office  [phone]   03/23/2021, 8:09 AM  LOS: 15 days

## 2021-03-23 NOTE — Progress Notes (Signed)
CARDIAC REHAB PHASE I   Offered to walk with pt. Pt states ambulation with mobility tech with plans for a second walk later this afternoon. Feels better than yesterday, but not a hundred percent. Pt grateful for his "team". Provided support and encouragement. Will continue to follow.  [phone] Rufina Falco, RN BSN 03/23/2021 2:24 PM

## 2021-03-23 NOTE — Progress Notes (Signed)
Heating pad to upper back applied.

## 2021-03-23 NOTE — Progress Notes (Signed)
Mobility Specialist Progress Note    03/23/21 1613  Mobility  Activity Ambulated in hall  Level of Assistance Modified independent, requires aide device or extra time  Assistive Device  (IV pole)  Distance Ambulated (ft) 415 ft  Mobility Ambulated independently in hallway  Mobility Response Tolerated well  Mobility performed by Mobility specialist  Bed Position Chair  $Mobility charge 1 Mobility   Pre-Mobility: 104 HR, 103/71 BP, 92% SpO2 During Mobility: 107 HR Post-Mobility: 100 HR, 97% SpO2  Pt received in chair and agreeable. No complaints. During walk, O2 dropped below 88% a few times and pt took standing breaks to recover to lows 90s with pursed lip breathing. Pt returned to chair with call bell in reach.   Hildred Alamin Mobility Specialist  Mobility Specialist Phone: [phone]

## 2021-03-23 NOTE — Progress Notes (Addendum)
Patient ID: Brandon [name], male   DOB: [birth year], 65 y.o.   MRN: [id number]     Advanced Heart Failure Rounding Note  PCP-Cardiologist: [name], MD   Subjective:    Admitted with sepsis. Started on cefepime + vanc, now off abx (ID following). Afebrile.  10/27 started on ivabradine 7.5 mg twice a day and midodrine 5 mg TID 10/29 Co-ox 48%, started on milrinone 0.25.  10/30 IV Antibiotics stopped. Blood cultures no growth for 5 days.  10/31 started on Augmentin for PNA.  11/1 Milrinone off 11/2 Milrinone added back. Midodrine increased to 10 mg TID 11/8 Placed on 80 mg po lasix  11/10 Developed Afib w/ RVR, converted with IV amio  No recurrent AF on IV amio  Remains on milrinone 0.125. Co-ox marginal at 54%. CVP 7.   Having some positional shoulder pain. Feels better when sitting in chair.   Gets more short of breath when pain flares.   Continues on IV lasix 80 mg BID. Weight down 3 lb overnight. Is/Os not accurate.    Objective:   Weight Range: 75.6 kg Body mass index is 22.6 kg/m.   Vital Signs:   Temp:  [97.7 F (36.5 C)-98.4 F (36.9 C)] 98 F (36.7 C) (11/11 0405) Pulse Rate:  [78-138] 85 (11/11 0405) Resp:  [18-37] 18 (11/11 0405) BP: (90-118)/(57-82) 95/68 (11/11 0405) SpO2:  [90 %-100 %] 97 % (11/11 0405) Weight:  [75.6 kg] 75.6 kg (11/11 0405) Last BM Date: 03/22/21  Weight change: Filed Weights   03/20/21 0410 03/21/21 0500 03/23/21 0405  Weight: 75.5 kg 77 kg 75.6 kg    Intake/Output:   Intake/Output Summary (Last 24 hours) at 03/23/2021 0826 Last data filed at 03/23/2021 0619 Gross per 24 hour  Intake 1436.57 ml  Output 2001 ml  Net -564.43 ml     Physical Exam   CVP 7 General:  Thin, fatigued appearing WM. No distress HEENT: normal Neck: supple. JVP 7 cm. Carotids 2+ bilat; no bruits. No lymphadenopathy or thryomegaly appreciated. Cor: PMI nondisplaced. Regular rate & rhythm. No rubs, gallops 3/6 SEM  Lungs: decreased BS in  bases Abdomen: soft, nontender, nondistended. No hepatosplenomegaly. No bruits or masses. Good bowel sounds. Extremities: no cyanosis, clubbing, rash, edema Neuro: alert & orientedx3, cranial nerves grossly intact. moves all 4 extremities w/o difficulty. Affect pleasant   Telemetry   SR 80s-90s  Labs    CBC No results for input(s): WBC, NEUTROABS, HGB, HCT, MCV, PLT in the last 72 hours. Basic Metabolic Panel Recent Labs    03/22/21 0630 03/22/21 1816 03/23/21 0415  NA  --  131* 134*  K  --  3.2* 4.1  CL  --  92* 96*  CO2  --  28 31  GLUCOSE  --  275* 111*  BUN  --  23 25*  CREATININE  --  0.74 0.55*  CALCIUM  --  8.6* 9.0  MG 1.9  --   --    Liver Function Tests No results for input(s): AST, ALT, ALKPHOS, BILITOT, PROT, ALBUMIN in the last 72 hours.  No results for input(s): LIPASE, AMYLASE in the last 72 hours. Cardiac Enzymes No results for input(s): CKTOTAL, CKMB, CKMBINDEX, TROPONINI in the last 72 hours.  BNP: BNP (last 3 results) Recent Labs    11/30/20 0144 01/08/21 0731 01/18/21 1629  BNP 2,653.1* 734.2* 607.9*    ProBNP (last 3 results) Recent Labs    03/23/20 0922  PROBNP 520*     D-Dimer  No results for input(s): DDIMER in the last 72 hours. Hemoglobin A1C No results for input(s): HGBA1C in the last 72 hours. Fasting Lipid Panel No results for input(s): CHOL, HDL, LDLCALC, TRIG, CHOLHDL, LDLDIRECT in the last 72 hours. Thyroid Function Tests No results for input(s): TSH, T4TOTAL, T3FREE, THYROIDAB in the last 72 hours.  Invalid input(s): FREET3  Other results:   Imaging    No results found.   Medications:     Scheduled Medications:  (feeding supplement) PROSource Plus  30 mL Oral TID BM   apixaban  5 mg Oral BID   atorvastatin  80 mg Oral QPM   Chlorhexidine Gluconate Cloth  6 each Topical Daily   digoxin  0.125 mg Oral Daily   diphenhydrAMINE  50 mg Oral QHS   ezetimibe  10 mg Oral Daily   feeding supplement  237 mL  Oral BID BM   furosemide  80 mg Intravenous BID   gabapentin  200 mg Oral TID   lidocaine  1 patch Transdermal Q24H   melatonin  5 mg Oral QHS   midodrine  10 mg Oral TID WC   multivitamin with minerals  1 tablet Oral Daily   pantoprazole  40 mg Oral Daily   potassium chloride  40 mEq Oral BID   sertraline  50 mg Oral Daily   sodium chloride flush  10-40 mL Intracatheter Q12H   tamsulosin  0.4 mg Oral Daily    Infusions:  amiodarone 30 mg/hr (03/23/21 0615)   milrinone 0.125 mcg/kg/min (03/22/21 1807)     PRN Medications: acetaminophen **OR** acetaminophen, guaiFENesin, ondansetron **OR** ondansetron (ZOFRAN) IV, sodium chloride flush    Patient Profile   Brandon [name] is a 65 year old with history of smoker, CAD s/p previous MI, HTN, HL, chronic HFeEF, sepsis 11/2020 bacteremia.  Admitted with sepsis. Bld Cx - NGTD    Assessment/Plan    1. Acute on chronic systolic HFr EF - Echo 08/15/99 EF 20-25% RV mildly HK. moderate AS  Mean gradient 13 AVA 1.2 cm2 DI 0.30 - RHC (7/22): EF 20%, low filling pressures. CI 2.3.  - NYHA IV at baseline - 11/2 Restarted 0.125 milrinone  - Co-ox marginal at 54%. Recheck. - Narrow euvolemic window. CVP 7. Continue 80 mg IV lasix bid, supp K as needed. Anticipate using torsemide once volume status improved. Renal function stable.  - No bb with low output HF - Hold ivabradine given AFib   - Continue digoxin. Dig level ok (0.3)  - Continue midodrine 10 mg TID   - Discussed with [name] -  not a candidate for transplant.  - VAD w/u underway. Will try to support in house with milrinone, PT/OT and adequate nutrition.  - Initially planned for possible VAD in early December, date moved up to 11/25  2. Sepsis - H/o strep bacteremia 11/2020.  ICD extracted 12/04/20. Barostim was not removed as it is extravascular.  - Had been on IV antibiotics until 01/15/21. Blood cultures negative 01/30/21. - Afebrile.  - Procalcitonin 0.22->0.11 - 10/26 -Blood CX x2  No  growth 5 days.  - Ct chest concerning for pneumonia.  - ?PNA => initially on cefepime/vancomycin, now off both.  - Completed Augmentin 11/7  - 11/9 Procalcitonin < 0.1  3. Acute Hypoxic Respiratory Failure  - CXR with pulmonary edema initially, and was diuresed with IV Lasix.  -  CT chest completed 10/30 concerning for bronchopneumonia. On augmentin.  -  recurrent pulmonary edema. Restarted IV lasix.  4. CAD  s/p anterior STEMI (12/20). LHC showed chronically occluded RCA (with L>>R collaterals) and thrombotic occlusion of proximal LAD. Underwent PCI of LAD. - LHC (7/22) with stable CAD.  - No s/s angina - Continue atorvastatin 80. No aspirin d/t anticoagulation.  5. 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 H./O RUE DVT - On eliquis. Will need to change to heparin as we get closer to VAD  7. Severe protein-calorie malnutrition - prealbumin trending up 11>15.  - Continue oral supplement.  - calorie count underway  8. Microcytic anemia - MCV 79.  - Iron Sats 13%.  - Considered IV Iron but want make sure clear of infection. Wil give later this week   9. Atrial Fibrillation w/ RVR - new, developed 11/10. Now back in NSR   - c/w amio gtt 30/hr while on milrinone - Eliquis 5 mg bid  - Keep K > 4.0 and Mg >2.0   10. Hypokalemia - K 4.1. Continue to supp with diuresis - Mag 1.9 11/10. Replace.     Length of Stay: Brandon [name], [place], PA-C  03/23/2021, 8:26 AM  Advanced Heart Failure Team Pager [phone] (M-F; 7a - 5p)  Please contact [facility] for night-coverage after hours (5p -7a ) and weekends on [email or web address]   FINCH, LINDSAY N, PA-C  8:26 AM   Patient seen and examined with the above-signed Advanced Practice Provider and/or Housestaff. I personally reviewed laboratory data, imaging studies and relevant notes. I independently examined the patient and formulated the important aspects of the  plan. I have edited the note to reflect any of my changes or salient points. I have personally discussed the plan with the patient and/or family.  Remains on milrinone. Breathing ok. Denies orthopnea or PND. No further AF on IV amio. Walked 415 feet with mobility tech.   General:  Sitting up. No resp difficulty HEENT: normal Neck: supple. no JVD. Carotids 2+ bilat; no bruits. No lymphadenopathy or thryomegaly appreciated. Cor: PMI nondisplaced. Regular rate & rhythm. 2/6 AS Lungs: clear Abdomen: soft, nontender, nondistended. No hepatosplenomegaly. No bruits or masses. Good bowel sounds. Extremities: no cyanosis, clubbing, rash, edema Neuro: alert & orientedx3, cranial nerves grossly intact. moves all 4 extremities w/o difficulty. Affect pleasant  Remains tenuous. Continue to support with milrinone. Need to keep dry. Can give IV lasix as needed. Continue amio to suppress AF.  Continue to push nutritional supplementation. Recheck pre-albumin on Monday. Goal is to continue nutritional support ad PT to get him to VAD later this month.   Glori Bickers, MD  4:40 PM

## 2021-03-23 NOTE — Progress Notes (Signed)
Mobility Specialist Progress Note    03/23/21 1241  Mobility  Activity Ambulated in hall  Level of Assistance Standby assist, set-up cues, supervision of patient - no hands on  Assistive Device  (IV pole)  Distance Ambulated (ft) 350 ft  Mobility Ambulated independently in hallway  Mobility Response Tolerated well  Mobility performed by Mobility specialist  Bed Position Chair  $Mobility charge 1 Mobility   Pre-Mobility: 104 HR, 92% SpO2 Post-Mobility: 100% SpO2  Pt received and agreeable. C/o a bout of lightheadedness but quickly recovered with pursed lip breathing. Returned to chair with call bell in reach and RN present. Ambulated on 3LO2.  Hildred Alamin Mobility Specialist  Mobility Specialist Phone: [phone]

## 2021-03-24 DIAGNOSIS — J690 Pneumonitis due to inhalation of food and vomit: Secondary | ICD-10-CM | POA: Diagnosis not present

## 2021-03-24 DIAGNOSIS — J9621 Acute and chronic respiratory failure with hypoxia: Secondary | ICD-10-CM | POA: Diagnosis not present

## 2021-03-24 DIAGNOSIS — I5022 Chronic systolic (congestive) heart failure: Secondary | ICD-10-CM | POA: Diagnosis not present

## 2021-03-24 DIAGNOSIS — I10 Essential (primary) hypertension: Secondary | ICD-10-CM | POA: Diagnosis not present

## 2021-03-24 LAB — BASIC METABOLIC PANEL
Anion gap: 10 (ref 5–15)
BUN: 24 mg/dL — ABNORMAL HIGH (ref 8–23)
CO2: 28 mmol/L (ref 22–32)
Calcium: 8.9 mg/dL (ref 8.9–10.3)
Chloride: 88 mmol/L — ABNORMAL LOW (ref 98–111)
Creatinine, Ser: 0.64 mg/dL (ref 0.61–1.24)
GFR, Estimated: >60 mL/min (ref >60)
Glucose, Bld: 226 mg/dL — ABNORMAL HIGH (ref 70–99)
Potassium: 3.9 mmol/L (ref 3.5–5.1)
Sodium: 126 mmol/L — ABNORMAL LOW (ref 135–145)

## 2021-03-24 LAB — CBC
HCT: 29.9 % — ABNORMAL LOW (ref 39.0–52.0)
Hemoglobin: 9.4 g/dL — ABNORMAL LOW (ref 13.0–17.0)
MCH: 24.9 pg — ABNORMAL LOW (ref 26.0–34.0)
MCHC: 31.4 g/dL (ref 30.0–36.0)
MCV: 79.3 fL — ABNORMAL LOW (ref 80.0–100.0)
Platelets: 366 10*3/uL (ref 150–400)
RBC: 3.77 MIL/uL — ABNORMAL LOW (ref 4.22–5.81)
RDW: 18.7 % — ABNORMAL HIGH (ref 11.5–15.5)
WBC: 18.9 10*3/uL — ABNORMAL HIGH (ref 4.0–10.5)
nRBC: 0 % (ref 0.0–0.2)

## 2021-03-24 LAB — MAGNESIUM: Magnesium: 2 mg/dL (ref 1.7–2.4)

## 2021-03-24 LAB — COOXEMETRY PANEL
Carboxyhemoglobin: 1.7 % — ABNORMAL HIGH (ref 0.5–1.5)
Methemoglobin: 0.8 % (ref 0.0–1.5)
O2 Saturation: 58.9 %
Total hemoglobin: 9.6 g/dL — ABNORMAL LOW (ref 12.0–16.0)

## 2021-03-24 MED ORDER — TORSEMIDE 20 MG PO TABS
40.0000 mg | ORAL_TABLET | Freq: Two times a day (BID) | ORAL | Status: DC
  Administered 2021-03-24 – 2021-03-25 (×2): 40 mg via ORAL
  Filled 2021-03-24 (×2): qty 2

## 2021-03-24 MED ORDER — POLYETHYLENE GLYCOL 3350 17 G PO PACK
17.0000 g | PACK | Freq: Every day | ORAL | Status: DC
  Administered 2021-03-24 – 2021-04-05 (×9): 17 g via ORAL
  Filled 2021-03-24 (×12): qty 1

## 2021-03-24 MED ORDER — SENNA 8.6 MG PO TABS
2.0000 | ORAL_TABLET | Freq: Two times a day (BID) | ORAL | Status: DC
  Administered 2021-03-24 – 2021-04-05 (×25): 17.2 mg via ORAL
  Filled 2021-03-24 (×26): qty 2

## 2021-03-24 NOTE — Progress Notes (Signed)
[facility]  PROGRESS NOTE  Brandon [name] QIW:[id number] DOB: 02-Sep-1955 DOA: 03/07/2021 PCP: [name], MD   Brief HPI:   65 year old male with past medical history of coronary artery disease, chronic systolic heart failure with AICD extracted on 12/01/2020 due to infection, completed antibiotics on 01/15/2021 with Rocephin, moderate aortic stenosis, right upper extremity DVT came to ED with acute onset of dyspnea.  Was found to be hypoxemic, placed on 2 L/min of oxygen found to be in acute decompensated heart failure.  Cardiology was consulted.  Significant Events: 10/29 Co-ox 48%, started on milrinone 0.25.  10/30 IV Antibiotics stopped. Blood cultures no growth for 5 days.  10/31 started on augmentin. Milrinone cut back to 0.125 mcg.  03/12/2021 started on midodrine  03/24/2021 started back on milrinone   Antibiotics: 03/07/2021 IV Vanco and cefepime till 03/10/2021 03/11/2021 Augmentin   Microbiology data: 03/07/2021 blood culture: Remain negative till date   Procedures: None  Subjective   Patient seen and examined, sitting in chair.  Unable to lay flat in bed.  Currently on IV milrinone and amiodarone.   Assessment/Plan:   Pneumonia/sepsis -Initially started on vancomycin and cefepime which was stopped on 10/30 -High-resolution CT scan of the chest showed bronchopneumonia, so ID started him back on Augmentin for 7 more days -Completed Augmentin course on 11/7 -At this time he is off antibiotics   Acute respiratory failure with hypoxemia due to acute on chronic systolic heart failure -Last 2D echo in July 2022 showed EF 20% -Currently on ivabradine, Lasix, digoxin, midodrine, milrinone -Cardiology has been treating his CHF with intermittent IV Lasix -Currently IV Lasix has been stopped and patient started on Demadex 40 mg p.o. 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 fib  -Started on amiodarone infusion -Converted back to normal sinus rhythm -Patient is on anticoagulation with apixaban  Hypokalemia -Replete  Hyponatremia -Today sodium is 126 -Likely in the setting of CHF exacerbation -Also on diuretics -We will closely monitor patient's sodium level.   Scheduled medications:    (feeding supplement) PROSource Plus  30 mL Oral TID BM   apixaban  5 mg Oral BID   atorvastatin  80 mg Oral QPM   Chlorhexidine Gluconate Cloth  6 each Topical Daily   digoxin  0.125 mg Oral Daily   diphenhydrAMINE  50 mg Oral QHS   ezetimibe  10 mg Oral Daily   feeding supplement  237 mL Oral BID BM   gabapentin  200 mg Oral TID   lidocaine  1 patch Transdermal Q24H   melatonin  5 mg Oral QHS   midodrine  10 mg Oral TID WC   multivitamin with minerals  1 tablet Oral Daily   pantoprazole  40 mg Oral Daily   polyethylene glycol  17 g Oral Daily   potassium chloride  40 mEq Oral BID   senna  2 tablet Oral BID   sertraline  50 mg Oral Daily   sodium chloride flush  10-40 mL Intracatheter Q12H   tamsulosin  0.4 mg Oral Daily  torsemide  40 mg Oral BID     Data Reviewed:   CBG:  No results for input(s): GLUCAP in the last 168 hours.  SpO2: 96 % O2 Flow Rate (L/min): 3 L/min    Vitals:   03/24/21 0532 03/24/21 0600 03/24/21 0853 03/24/21 1207  BP:   103/73 104/63  Pulse:  98 100 97  Resp:  20 (!) 30 (!) 23  Temp:   98.6 F (37 C) 98 F (36.7 C)  TempSrc:   Oral Oral  SpO2:  98% 97% 96%  Weight: 75.7 kg     Height:          Intake/Output Summary (Last 24 hours) at 03/24/2021 1322 Last data filed at 03/24/2021 1207 Gross per 24 hour  Intake 1406.15 ml  Output 2950 ml  Net -1543.85 ml    11/10 1901 - 11/12 0700 In: 3140.1 [P.O.:2144; I.V.:996.1] Out: 3351 [Urine:3350]  Filed Weights   03/21/21 0500 03/23/21 0405 03/24/21 0532  Weight: 77 kg 75.6 kg 75.7 kg    Data Reviewed: Basic Metabolic Panel: Recent Labs  Lab 03/18/21 0538 03/19/21 0510 03/20/21 0419 03/21/21 0132 03/22/21 0425 03/22/21 0630 03/22/21 1816 03/23/21 0415 03/24/21 0223  NA 135 134*   < > 134* 133*  --  131* 134* 126*  K 3.6 3.9   < > 3.8 3.1*  --  3.2* 4.1 3.9  CL 98 99   < > 97* 94*  --  92* 96* 88*  CO2 29 28   < > 30 30  --  28 31 28   GLUCOSE 109* 116*   < > 107* 125*  --  275* 111* 226*  BUN 16 17   < > 22 20  --  23 25* 24*  CREATININE 0.56* 0.59*   < > 0.65 0.60*  --  0.74 0.55* 0.64  CALCIUM 8.9 8.8*   < > 8.9 9.1  --  8.6* 9.0 8.9  MG 2.0 2.1  --   --   --  1.9  --   --  2.0   < > = values in this interval not displayed.   Liver Function Tests: No results for input(s): AST, ALT, ALKPHOS, BILITOT, PROT, ALBUMIN in the last 168 hours. No results for input(s): LIPASE, AMYLASE in the last 168 hours. No results for input(s): AMMONIA in the last 168 hours. CBC: Recent Labs  Lab 03/18/21 0538 03/24/21 0223  WBC 10.4 18.9*  HGB 9.1* 9.4*  HCT 29.6* 29.9*  MCV 79.4* 79.3*  PLT 349 366   Cardiac Enzymes: No results for input(s): CKTOTAL, CKMB, CKMBINDEX, TROPONINI in the last 168 hours. BNP (last 3 results) Recent Labs    11/30/20 0144 01/08/21 0731 01/18/21 1629  BNP 2,653.1* 734.2* 607.9*    ProBNP (last 3 results) No results for input(s): PROBNP in the last 8760 hours.   CBG: No results for input(s): GLUCAP in the last 168 hours.     Radiology Reports  No results found.     Antibiotics: Anti-infectives (From admission, onward)    Start     Dose/Rate Route Frequency Ordered  Stop   03/12/21 1000  amoxicillin-clavulanate (AUGMENTIN) 875-125 MG per tablet 1 tablet        1 tablet Oral Every 12 hours 03/12/21 0832 03/19/21 0935   03/08/21 0700  vancomycin (VANCOREADY) IVPB 1250 mg/250 mL  Status:  Discontinued        1,250 mg 166.7 mL/hr over 90 Minutes Intravenous Every 12 hours 03/07/21  1801 03/09/21 1320   03/08/21 0000  ceFEPIme (MAXIPIME) 2 g in sodium chloride 0.9 % 100 mL IVPB  Status:  Discontinued        2 g 200 mL/hr over 30 Minutes Intravenous Every 8 hours 03/07/21 1800 03/10/21 1201   03/07/21 1500  vancomycin (VANCOREADY) IVPB 1500 mg/300 mL        1,500 mg 150 mL/hr over 120 Minutes Intravenous  Once 03/07/21 1455 03/08/21 0118   03/07/21 1500  ceFEPIme (MAXIPIME) 2 g in sodium chloride 0.9 % 100 mL IVPB        2 g 200 mL/hr over 30 Minutes Intravenous  Once 03/07/21 1455 03/07/21 1628         DVT prophylaxis: Apixaban  Code Status: Full code  Family Communication: No family at bedside   Consultants: Cardiology  Procedures:     Objective    Physical Examination:  General-appears in no acute distress Heart-S1-S2, regular, no murmur auscultated Lungs-clear to auscultation bilaterally, no wheezing or crackles auscultated Abdomen-soft, nontender, no organomegaly Extremities-no edema in the lower extremities Neuro-alert, oriented x3, no focal deficit noted  Status is: Inpatient  Dispo: The patient is from: Home              Anticipated d/c is to: Home              Anticipated d/c date is: 03/29/2021              Patient currently not stable for discharge  Barrier to discharge-ongoing treatment of acute systolic heart failure  [id number] Labs  No results for input(s): DDIMER, FERRITIN, LDH, CRP in the last 72 hours.  Lab Results  Component Value Date   [place] NEGATIVE 03/07/2021   [place] NEGATIVE 01/08/2021   [place] NEGATIVE 12/04/2020   [place] NEGATIVE 11/14/2020            No results  found for this or any previous visit (from the past 240 hour(s)).  Oswald Hillock   [facility] If 7PM-7AM, please contact night-coverage at [email or web address], Office  [phone]   03/24/2021, 1:22 PM  LOS: 16 days

## 2021-03-24 NOTE — Progress Notes (Signed)
Mobility Specialist Progress Note    03/24/21 1534  Mobility  Activity Ambulated in hall  Level of Assistance Modified independent, requires aide device or extra time  Assistive Device  (IV pole)  Distance Ambulated (ft) 650 ft  Mobility Ambulated independently in hallway  Mobility Response Tolerated well  Mobility performed by Mobility specialist  $Mobility charge 1 Mobility   Pt received in bed and agreeable. No complaints on walk. Focused on pursed lip breathing. Ambulated on 4LO2. Took one short standing break after about 500 ft. Returned to bed with call bell in reach.   Hildred Alamin Mobility Specialist  Mobility Specialist Phone: [phone]

## 2021-03-24 NOTE — Progress Notes (Signed)
CARDIAC REHAB PHASE I   Offered to walk with pt. Pt states he had a rough night, needing to spend most of it in the chair due to SOB. Pt recently back into bed to rest before plans to walk later with mobility team. Pt expresses some discouragement as he feels like he is taking steps backwards. Provided support and encouragement. Pt states increasing difficulty eating meals, pt states openness to feeding tube if it is necessary. Permission obtained from Dr. [name] for outside privileges with staff. Will continue to follow.  [phone] Rufina Falco, RN BSN 03/24/2021 12:27 PM

## 2021-03-24 NOTE — Progress Notes (Addendum)
Patient ID: Brandon [name], male   DOB: 12-13-1955, 65 y.o.   MRN: [id number]     Advanced Heart Failure Rounding Note  PCP-Cardiologist: [name], MD   Subjective:    Admitted with sepsis. Started on cefepime + vanc, now off abx (ID following). Afebrile.  10/27 started on ivabradine 7.5 mg twice a day and midodrine 5 mg TID 10/29 Co-ox 48%, started on milrinone 0.25.  10/30 IV Antibiotics stopped. Blood cultures no growth for 5 days.  10/31 started on Augmentin for PNA.  11/1 Milrinone off 11/2 Milrinone added back. Midodrine increased to 10 mg TID 11/8 Placed on 80 mg po lasix  11/10 Developed Afib w/ RVR, converted with IV amio  Remains on milrinone 0.125 and IV amio. [name] Co-ox 58% No further AF  Getting lasix 80 IV bid. Weight stable 166. CVP 2. Says he felt more SOB last night. + orthopnea      Objective:   Weight Range: 75.7 kg Body mass index is 22.63 kg/m.   Vital Signs:   Temp:  [97.6 F (36.4 C)-98.6 F (37 C)] 98.6 F (37 C) (11/12 0853) Pulse Rate:  [82-108] 100 (11/12 0853) Resp:  [16-30] 30 (11/12 0853) BP: (103-114)/(68-87) 103/73 (11/12 0853) SpO2:  [93 %-100 %] 97 % (11/12 0853) Weight:  [75.7 kg] 75.7 kg (11/12 0532) Last BM Date: 03/22/21  Weight change: Filed Weights   03/21/21 0500 03/23/21 0405 03/24/21 0532  Weight: 77 kg 75.6 kg 75.7 kg    Intake/Output:   Intake/Output Summary (Last 24 hours) at 03/24/2021 1147 Last data filed at 03/24/2021 0900 Gross per 24 hour  Intake 1639.33 ml  Output 2150 ml  Net -510.67 ml      Physical Exam   General:  Sitting in chair No resp difficulty HEENT: normal Neck: supple. no JVD. Carotids 2+ bilat; no bruits. No lymphadenopathy or thryomegaly appreciated. Cor: PMI nondisplaced. Regular rate & rhythm. 2/6 SEM Lungs: clear Abdomen: soft, nontender, nondistended. No hepatosplenomegaly. No bruits or masses. Good bowel sounds. Extremities: no cyanosis, clubbing, rash, edema Neuro: alert &  orientedx3, cranial nerves grossly intact. moves all 4 extremities w/o difficulty. Affect pleasant   Telemetry   SR 90-100 Personally reviewed   Labs    CBC Recent Labs    03/24/21 0223  WBC 18.9*  HGB 9.4*  HCT 29.9*  MCV 79.3*  PLT 979   Basic Metabolic Panel Recent Labs    03/22/21 0630 03/22/21 1816 03/23/21 0415 03/24/21 0223  NA  --    < > 134* 126*  K  --    < > 4.1 3.9  CL  --    < > 96* 88*  CO2  --    < > 31 28  GLUCOSE  --    < > 111* 226*  BUN  --    < > 25* 24*  CREATININE  --    < > 0.55* 0.64  CALCIUM  --    < > 9.0 8.9  MG 1.9  --   --  2.0   < > = values in this interval not displayed.    Liver Function Tests No results for input(s): AST, ALT, ALKPHOS, BILITOT, PROT, ALBUMIN in the last 72 hours.  No results for input(s): LIPASE, AMYLASE in the last 72 hours. Cardiac Enzymes No results for input(s): CKTOTAL, CKMB, CKMBINDEX, TROPONINI in the last 72 hours.  BNP: BNP (last 3 results) Recent Labs    11/30/20 0144 01/08/21 0731 01/18/21  1629  BNP 2,653.1* 734.2* 607.9*     ProBNP (last 3 results) No results for input(s): PROBNP in the last 8760 hours.    D-Dimer No results for input(s): DDIMER in the last 72 hours. Hemoglobin A1C No results for input(s): HGBA1C in the last 72 hours. Fasting Lipid Panel No results for input(s): CHOL, HDL, LDLCALC, TRIG, CHOLHDL, LDLDIRECT in the last 72 hours. Thyroid Function Tests No results for input(s): TSH, T4TOTAL, T3FREE, THYROIDAB in the last 72 hours.  Invalid input(s): FREET3  Other results:   Imaging    No results found.   Medications:     Scheduled Medications:  (feeding supplement) PROSource Plus  30 mL Oral TID BM   apixaban  5 mg Oral BID   atorvastatin  80 mg Oral QPM   Chlorhexidine Gluconate Cloth  6 each Topical Daily   digoxin  0.125 mg Oral Daily   diphenhydrAMINE  50 mg Oral QHS   ezetimibe  10 mg Oral Daily   feeding supplement  237 mL Oral BID BM    furosemide  80 mg Intravenous BID   gabapentin  200 mg Oral TID   lidocaine  1 patch Transdermal Q24H   melatonin  5 mg Oral QHS   midodrine  10 mg Oral TID WC   multivitamin with minerals  1 tablet Oral Daily   pantoprazole  40 mg Oral Daily   potassium chloride  40 mEq Oral BID   sertraline  50 mg Oral Daily   sodium chloride flush  10-40 mL Intracatheter Q12H   tamsulosin  0.4 mg Oral Daily    Infusions:  amiodarone 30 mg/hr (03/24/21 0640)   milrinone 0.125 mcg/kg/min (03/24/21 0539)     PRN Medications: acetaminophen **OR** acetaminophen, docusate sodium, guaiFENesin, ondansetron **OR** ondansetron (ZOFRAN) IV, sodium chloride flush    Patient Profile   Mr Yasuda is a 65 year old with history of smoker, CAD s/p previous MI, HTN, HL, chronic HFeEF, sepsis 11/2020 bacteremia.  Admitted with sepsis. Bld Cx - NGTD    Assessment/Plan    1. Acute on chronic systolic HFr EF - Echo 01/17/01 EF 20-25% RV mildly HK. moderate AS  Mean gradient 13 AVA 1.2 cm2 DI 0.30 - RHC (7/22): EF 20%, low filling pressures. CI 2.3.  - NYHA IV at baseline - 11/2 Restarted 0.125 milrinone  - Co-ox remains marginal at 59% - Volume status much improved with IV lasix. CVP 2. Switch to torsemide 40 bid Can give extra IV lasix as needed - No bb with low output HF - Hold ivabradine given AF   - Continue digoxin. Dig level ok (0.3)  - Continue midodrine 10 mg TID   - Discussed with [name] -  not a candidate for transplant.  - VAD w/u underway. Will try to support in house with milrinone, PT/OT and adequate nutrition.  - Initially planned for possible VAD in early December, date moved up to 11/25  2. Sepsis - H/o strep bacteremia 11/2020.  ICD extracted 12/04/20. Barostim was not removed as it is extravascular.  - Had been on IV antibiotics until 01/15/21. Blood cultures negative 01/30/21. - Afebrile.  - Procalcitonin 0.22->0.11 - 10/26 -Blood CX x2  No growth 5 days.  - Ct chest concerning for  pneumonia.  - ?PNA => initially on cefepime/vancomycin, now off both.  - Completed Augmentin 11/7  - 11/9 Procalcitonin < 0.1  3. Acute Hypoxic Respiratory Failure  - CXR with pulmonary edema initially, and was diuresed with  IV Lasix.  -  CT chest completed 10/30 concerning for bronchopneumonia. On augmentin.  -  recurrent pulmonary edema. Restarted IV lasix.    4. CAD  s/p anterior STEMI (12/20). LHC showed chronically occluded RCA (with L>>R collaterals) and thrombotic occlusion of proximal LAD. Underwent PCI of LAD. - LHC (7/22) with stable CAD.  - No s/s angina - Continue atorvastatin 80. No aspirin d/t anticoagulation.  5. 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 H./O RUE DVT - On eliquis. Will need to change to heparin as we get closer to VAD  7. Severe protein-calorie malnutrition - prealbumin trending up 11>15.  - Continue oral supplement.  - calorie count underway  8. Microcytic anemia - MCV 79.  - Iron Sats 13%.  - Considered IV Iron but want make sure clear of infection. Wil give later this week   9. Paroxysmal Atrial Fibrillation w/ RVR - new, developed 11/10. Now back in NSR   - c/w amio gtt 30/hr while on milrinone - Eliquis 5 mg bid  - Keep K > 4.0 and Mg >2.0   10. Hypokalemia - K 4.1. Continue to supp with diuresis - Mag 2.0  11. Hyponatremia = Na 126 - restrict free water    Length of Stay: [place], MD  03/24/2021, 11:47 AM  Advanced Heart Failure Team Pager [phone] (M-F; 7a - 5p)  Please contact [facility] for night-coverage after hours (5p -7a ) and weekends on [email or web address]

## 2021-03-25 DIAGNOSIS — J9621 Acute and chronic respiratory failure with hypoxia: Secondary | ICD-10-CM | POA: Diagnosis not present

## 2021-03-25 DIAGNOSIS — I10 Essential (primary) hypertension: Secondary | ICD-10-CM | POA: Diagnosis not present

## 2021-03-25 DIAGNOSIS — J690 Pneumonitis due to inhalation of food and vomit: Secondary | ICD-10-CM | POA: Diagnosis not present

## 2021-03-25 DIAGNOSIS — R7881 Bacteremia: Secondary | ICD-10-CM | POA: Diagnosis not present

## 2021-03-25 LAB — BASIC METABOLIC PANEL
Anion gap: 11 (ref 5–15)
BUN: 22 mg/dL (ref 8–23)
CO2: 29 mmol/L (ref 22–32)
Calcium: 8.5 mg/dL — ABNORMAL LOW (ref 8.9–10.3)
Chloride: 88 mmol/L — ABNORMAL LOW (ref 98–111)
Creatinine, Ser: 0.64 mg/dL (ref 0.61–1.24)
GFR, Estimated: >60 mL/min (ref >60)
Glucose, Bld: 225 mg/dL — ABNORMAL HIGH (ref 70–99)
Potassium: 3.5 mmol/L (ref 3.5–5.1)
Sodium: 128 mmol/L — ABNORMAL LOW (ref 135–145)

## 2021-03-25 LAB — COOXEMETRY PANEL
Carboxyhemoglobin: 1.9 % — ABNORMAL HIGH (ref 0.5–1.5)
Methemoglobin: 1.2 % (ref 0.0–1.5)
O2 Saturation: 65.6 %
Total hemoglobin: 8.9 g/dL — ABNORMAL LOW (ref 12.0–16.0)

## 2021-03-25 LAB — PREALBUMIN: Prealbumin: 13 mg/dL — ABNORMAL LOW (ref 18–38)

## 2021-03-25 LAB — CBC
HCT: 28.8 % — ABNORMAL LOW (ref 39.0–52.0)
Hemoglobin: 9 g/dL — ABNORMAL LOW (ref 13.0–17.0)
MCH: 24.7 pg — ABNORMAL LOW (ref 26.0–34.0)
MCHC: 31.3 g/dL (ref 30.0–36.0)
MCV: 79.1 fL — ABNORMAL LOW (ref 80.0–100.0)
Platelets: 327 10*3/uL (ref 150–400)
RBC: 3.64 MIL/uL — ABNORMAL LOW (ref 4.22–5.81)
RDW: 18.8 % — ABNORMAL HIGH (ref 11.5–15.5)
WBC: 12.8 10*3/uL — ABNORMAL HIGH (ref 4.0–10.5)
nRBC: 0 % (ref 0.0–0.2)

## 2021-03-25 MED ORDER — SODIUM CHLORIDE 0.9 % IV BOLUS
250.0000 mL | Freq: Once | INTRAVENOUS | Status: AC
  Administered 2021-03-25: 250 mL via INTRAVENOUS

## 2021-03-25 MED ORDER — SODIUM CHLORIDE 0.9 % IV SOLN
510.0000 mg | Freq: Once | INTRAVENOUS | Status: AC
  Administered 2021-03-25: 510 mg via INTRAVENOUS
  Filled 2021-03-25: qty 17

## 2021-03-25 MED ORDER — SERTRALINE HCL 100 MG PO TABS
100.0000 mg | ORAL_TABLET | Freq: Every day | ORAL | Status: DC
  Administered 2021-03-26 – 2021-05-09 (×44): 100 mg via ORAL
  Filled 2021-03-25 (×44): qty 1

## 2021-03-25 NOTE — Progress Notes (Addendum)
Patient ID: Brandon [name], male   DOB: Feb 27, 1956, 65 y.o.   MRN: [id number]     Advanced Heart Failure Rounding Note  PCP-Cardiologist: [name], MD   Subjective:    Admitted with sepsis. Started on cefepime + vanc, now off abx (ID following). Afebrile.  10/27 started on ivabradine 7.5 mg twice a day and midodrine 5 mg TID 10/29 Co-ox 48%, started on milrinone 0.25.  10/30 IV Antibiotics stopped. Blood cultures no growth for 5 days.  10/31 started on Augmentin for PNA.  11/1 Milrinone off 11/2 Milrinone added back. Midodrine increased to 10 mg TID 11/8 Placed on 80 mg po lasix  11/10 Developed Afib w/ RVR, converted with IV amio  Remains on milrinone 0.125. Now on po torsemide. CVP 1 Intermittent SOB. On IV amio. No further AF. Pre-albumin 15 -> 13.  Frustrated over low pre-albumin  Walked 650 feet yesterday  Objective:   Weight Range: 76.5 kg Body mass index is 22.87 kg/m.   Vital Signs:   Temp:  [98 F (36.7 C)-98.4 F (36.9 C)] 98 F (36.7 C) (11/13 0728) Pulse Rate:  [93-113] 100 (11/12 2356) Resp:  [20-29] 24 (11/13 0728) BP: (87-114)/(60-63) 87/62 (11/12 2356) SpO2:  [92 %-98 %] 92 % (11/12 2356) Weight:  [76.5 kg] 76.5 kg (11/13 0430) Last BM Date: 03/22/21  Weight change: Filed Weights   03/23/21 0405 03/24/21 0532 03/25/21 0430  Weight: 75.6 kg 75.7 kg 76.5 kg    Intake/Output:   Intake/Output Summary (Last 24 hours) at 03/25/2021 0949 Last data filed at 03/25/2021 0947 Gross per 24 hour  Intake 937.52 ml  Output 1375 ml  Net -437.48 ml      Physical Exam   General:  Sitting in chair No resp difficulty HEENT: normal Neck: supple. no JVD. Carotids 2+ bilat; no bruits. No lymphadenopathy or thryomegaly appreciated. Cor: PMI nondisplaced. Regular rate & rhythm. 2/6 SEM Lungs: clear Abdomen: soft, nontender, nondistended. No hepatosplenomegaly. No bruits or masses. Good bowel sounds. Extremities: no cyanosis, clubbing, rash, edema Neuro:  alert & orientedx3, cranial nerves grossly intact. moves all 4 extremities w/o difficulty. Affect pleasant  Telemetry   SR 90-110 Personally reviewed   Labs    CBC Recent Labs    03/24/21 0223 03/25/21 0435  WBC 18.9* 12.8*  HGB 9.4* 9.0*  HCT 29.9* 28.8*  MCV 79.3* 79.1*  PLT 366 096    Basic Metabolic Panel Recent Labs    03/24/21 0223 03/25/21 0435  NA 126* 128*  K 3.9 3.5  CL 88* 88*  CO2 28 29  GLUCOSE 226* 225*  BUN 24* 22  CREATININE 0.64 0.64  CALCIUM 8.9 8.5*  MG 2.0  --     Liver Function Tests No results for input(s): AST, ALT, ALKPHOS, BILITOT, PROT, ALBUMIN in the last 72 hours.  No results for input(s): LIPASE, AMYLASE in the last 72 hours. Cardiac Enzymes No results for input(s): CKTOTAL, CKMB, CKMBINDEX, TROPONINI in the last 72 hours.  BNP: BNP (last 3 results) Recent Labs    11/30/20 0144 01/08/21 0731 01/18/21 1629  BNP 2,653.1* 734.2* 607.9*     ProBNP (last 3 results) No results for input(s): PROBNP in the last 8760 hours.    D-Dimer No results for input(s): DDIMER in the last 72 hours. Hemoglobin A1C No results for input(s): HGBA1C in the last 72 hours. Fasting Lipid Panel No results for input(s): CHOL, HDL, LDLCALC, TRIG, CHOLHDL, LDLDIRECT in the last 72 hours. Thyroid Function Tests No results  for input(s): TSH, T4TOTAL, T3FREE, THYROIDAB in the last 72 hours.  Invalid input(s): FREET3  Other results:   Imaging    No results found.   Medications:     Scheduled Medications:  (feeding supplement) PROSource Plus  30 mL Oral TID BM   apixaban  5 mg Oral BID   atorvastatin  80 mg Oral QPM   Chlorhexidine Gluconate Cloth  6 each Topical Daily   digoxin  0.125 mg Oral Daily   diphenhydrAMINE  50 mg Oral QHS   ezetimibe  10 mg Oral Daily   feeding supplement  237 mL Oral BID BM   gabapentin  200 mg Oral TID   lidocaine  1 patch Transdermal Q24H   melatonin  5 mg Oral QHS   midodrine  10 mg Oral TID WC    multivitamin with minerals  1 tablet Oral Daily   pantoprazole  40 mg Oral Daily   polyethylene glycol  17 g Oral Daily   potassium chloride  40 mEq Oral BID   senna  2 tablet Oral BID   sertraline  50 mg Oral Daily   sodium chloride flush  10-40 mL Intracatheter Q12H   tamsulosin  0.4 mg Oral Daily   torsemide  40 mg Oral BID    Infusions:  amiodarone 30 mg/hr (03/25/21 0737)   milrinone 0.125 mcg/kg/min (03/25/21 0500)     PRN Medications: acetaminophen **OR** acetaminophen, docusate sodium, guaiFENesin, ondansetron **OR** ondansetron (ZOFRAN) IV, sodium chloride flush    Patient Profile   Mr Thomann is a 65 year old with history of smoker, CAD s/p previous MI, HTN, HL, chronic HFeEF, sepsis 11/2020 bacteremia.  Admitted with sepsis. Bld Cx - NGTD    Assessment/Plan    1. Acute on chronic systolic HFr EF - Echo 01/19/91 EF 20-25% RV mildly HK. moderate AS  Mean gradient 13 AVA 1.2 cm2 DI 0.30 - RHC (7/22): EF 20%, low filling pressures. CI 2.3.  - NYHA IV at baseline - 11/2 Restarted 0.125 milrinone  - Co-ox 66% - Volume status low. Hold torsemide. Give 250 IVF - No bb with low output HF - Hold ivabradine given AF   - Continue digoxin. Dig level ok - Continue midodrine 10 mg TID   - Discussed with [name] -  not a candidate for transplant.  - VAD w/u underway. Will try to support in house with milrinone, PT/OT and adequate nutrition.  - Initially planned for possible VAD in early December, date moved up to 11/25 - Pre-albumin dropping. Will re-visit nighttime Cor-trak feeds  2. Sepsis - H/o strep bacteremia 11/2020.  ICD extracted 12/04/20. Barostim was not removed as it is extravascular.  - Received IV antibiotics until 01/15/21. Blood cultures negative 01/30/21..  - Procalcitonin 0.22->0.11 - 10/26 -Blood CX x2  No growth 5 days.  - Ct chest concerning for pneumonia. Treated w/ cefepime/vancomycin -> Augmentin (stop 11/7) - Remains AF  3. Acute Hypoxic Respiratory  Failure  - CXR with pulmonary edema initially, and was diuresed with IV Lasix.  -  CT chest completed 10/30 concerning for bronchopneumonia. On augmentin.  -  recurrent pulmonary edema. Restarted IV lasix.  - stable/improved   4. CAD  s/p anterior STEMI (12/20). LHC showed chronically occluded RCA (with L>>R collaterals) and thrombotic occlusion of proximal LAD. Underwent PCI of LAD. - LHC (7/22) with stable CAD.  - No s/s angina - Continue atorvastatin 80. No aspirin d/t anticoagulation.  5. Severe low-flow aortic stenosis - not candidate  for TAVR due to presence of fibroelastoma - pending VAD   6. H./O RUE DVT - On eliquis. Will need to change to heparin as we get closer to VAD  7. Severe protein-calorie malnutrition - prealbumin 11>15> 13 despite adequate caloric intake on calorie count - Pre-albumin dropping. Will re-visit nighttime Cor-trak feeds with Nutrition vs other options  8. Microcytic anemia - MCV 79.  - Iron Sats 13%.  - Rec'd Feraheme 11/8. Needs 2nd dose  9. Paroxysmal Atrial Fibrillation w/ RVR - new, developed 11/10. Now back in NSR   - c/w amio gtt 30/hr while on milrinone - Eliquis 5 mg bid  - Keep K > 4.0 and Mg >2.0   10. Hypokalemia - K 3.5. Continue to supp   11. Hyponatremia - Na 126 -> 128 - restrict free water    Length of Stay: [place], MD  03/25/2021, 9:49 AM  Advanced Heart Failure Team Pager [phone] (M-F; 7a - 5p)  Please contact [facility] for night-coverage after hours (5p -7a ) and weekends on [email or web address]   Glori Bickers, MD  9:49 AM

## 2021-03-25 NOTE — Progress Notes (Signed)
Mobility Specialist Progress Note    03/25/21 1510  Mobility  Activity Ambulated in hall  Level of Assistance Modified independent, requires aide device or extra time  Assistive Device  (IV pole)  Distance Ambulated (ft) 750 ft  Mobility Ambulated independently in hallway  Mobility Response Tolerated well  Mobility performed by Mobility specialist  $Mobility charge 1 Mobility   Pt received in chair and agreeable. Found on 4.5LO2 but ambulated on 6LO2. Focused on pursed lip breathing to recover if SpO2 dropped below 88%. Took x3 standing breaks. C/o feeling winded during walk. Returned to bed with call bell in reach.  Hildred Alamin Mobility Specialist  Mobility Specialist Phone: [phone]

## 2021-03-25 NOTE — Progress Notes (Signed)
[facility]  PROGRESS NOTE  Brandon [name] LNL:[id number] DOB: December 17, 1955 DOA: 03/07/2021 PCP: [name], MD   Brief HPI:   65 year old male with past medical history of coronary artery disease, chronic systolic heart failure with AICD extracted on 12/01/2020 due to infection, completed antibiotics on 01/15/2021 with Rocephin, moderate aortic stenosis, right upper extremity DVT came to ED with acute onset of dyspnea.  Was found to be hypoxemic, placed on 2 L/min of oxygen found to be in acute decompensated heart failure.  Cardiology was consulted.  Significant Events: 10/29 Co-ox 48%, started on milrinone 0.25.  10/30 IV Antibiotics stopped. Blood cultures no growth for 5 days.  10/31 started on augmentin. Milrinone cut back to 0.125 mcg.  03/12/2021 started on midodrine  03/24/2021 started back on milrinone   Antibiotics: 03/07/2021 IV Vanco and cefepime till 03/10/2021 03/11/2021 Augmentin   Microbiology data: 03/07/2021 blood culture: Remain negative till date   Procedures: None  Subjective   Patient seen and examined, says that he feels better today.  Denies shortness of breath.   Assessment/Plan:   Pneumonia/sepsis -Initially started on vancomycin and cefepime which was stopped on 10/30 -High-resolution CT scan of the chest showed bronchopneumonia, so ID started him back on Augmentin for 7 more days -Completed Augmentin course on 11/7 -At this time he is off antibiotics   Acute respiratory failure with hypoxemia due to acute on chronic systolic heart failure -Last 2D echo in July 2022 showed EF 20% -Currently on ivabradine,  digoxin, midodrine, milrinone -Cardiology has been treating his CHF with intermittent IV Lasix -Currently IV Lasix has been stopped and patient started on Demadex 40 mg p.o. twice daily -Cardiology recommending VAD -Not a transplant candidate  per [name], as per cards note -Potential date for VAD is 11/25  Dyslipidemia -Continue  atorvastatin  Right upper extremity DVT with neuropathy -Continue apixaban, gabapentin  Depression -Continue sertraline  BPH -Continue Flomax  Protein calorie malnutrition -Continue Ensure 3 times a day  Severe low-flow aortic stenosis -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 fib  -Started on amiodarone infusion -Converted back to normal sinus rhythm -Patient is on anticoagulation with apixaban  Hypokalemia -Replete  Hyponatremia -Today sodium is 128 -Likely in the setting of CHF exacerbation -Also on diuretics -We will closely monitor patient's sodium level.   Scheduled medications:    (feeding supplement) PROSource Plus  30 mL Oral TID BM   apixaban  5 mg Oral BID   atorvastatin  80 mg Oral QPM   Chlorhexidine Gluconate Cloth  6 each Topical Daily   digoxin  0.125 mg Oral Daily   diphenhydrAMINE  50 mg Oral QHS   ezetimibe  10 mg Oral Daily   feeding supplement  237 mL Oral BID BM   gabapentin  200 mg Oral TID   lidocaine  1 patch Transdermal Q24H   melatonin  5 mg Oral QHS   midodrine  10 mg Oral TID WC   multivitamin with minerals  1 tablet Oral Daily   pantoprazole  40 mg Oral Daily   polyethylene glycol  17 g Oral Daily   potassium chloride  40 mEq Oral BID   senna  2 tablet Oral BID   sertraline  50 mg Oral Daily   sodium chloride flush  10-40 mL Intracatheter Q12H   tamsulosin  0.4 mg Oral Daily   torsemide  40 mg Oral  BID     Data Reviewed:   CBG:  No results for input(s): GLUCAP in the last 168 hours.  SpO2: 92 % O2 Flow Rate (L/min): 3 L/min    Vitals:   03/24/21 2356 03/25/21 0430 03/25/21 0728 03/25/21 0956  BP: (!) 87/62     Pulse: 100   (!) 107  Resp: 20  (!) 24   Temp: 98 F (36.7 C)  98 F (36.7 C)   TempSrc: Oral  Oral   SpO2: 92%     Weight:  76.5 kg    Height:         Intake/Output Summary (Last 24 hours) at  03/25/2021 1036 Last data filed at 03/25/2021 1014 Gross per 24 hour  Intake 1297.52 ml  Output 1575 ml  Net -277.48 ml    11/11 1901 - 11/13 0700 In: 2011.8 [P.O.:1320; I.V.:691.8] Out: 2525 [Urine:2525]  Filed Weights   03/23/21 0405 03/24/21 0532 03/25/21 0430  Weight: 75.6 kg 75.7 kg 76.5 kg    Data Reviewed: Basic Metabolic Panel: Recent Labs  Lab 03/19/21 0510 03/20/21 0419 03/22/21 0425 03/22/21 0630 03/22/21 1816 03/23/21 0415 03/24/21 0223 03/25/21 0435  NA 134*   < > 133*  --  131* 134* 126* 128*  K 3.9   < > 3.1*  --  3.2* 4.1 3.9 3.5  CL 99   < > 94*  --  92* 96* 88* 88*  CO2 28   < > 30  --  28 31 28 29   GLUCOSE 116*   < > 125*  --  275* 111* 226* 225*  BUN 17   < > 20  --  23 25* 24* 22  CREATININE 0.59*   < > 0.60*  --  0.74 0.55* 0.64 0.64  CALCIUM 8.8*   < > 9.1  --  8.6* 9.0 8.9 8.5*  MG 2.1  --   --  1.9  --   --  2.0  --    < > = values in this interval not displayed.   Liver Function Tests: No results for input(s): AST, ALT, ALKPHOS, BILITOT, PROT, ALBUMIN in the last 168 hours. No results for input(s): LIPASE, AMYLASE in the last 168 hours. No results for input(s): AMMONIA in the last 168 hours. CBC: Recent Labs  Lab 03/24/21 0223 03/25/21 0435  WBC 18.9* 12.8*  HGB 9.4* 9.0*  HCT 29.9* 28.8*  MCV 79.3* 79.1*  PLT 366 327   Cardiac Enzymes: No results for input(s): CKTOTAL, CKMB, CKMBINDEX, TROPONINI in the last 168 hours. BNP (last 3 results) Recent Labs    11/30/20 0144 01/08/21 0731 01/18/21 1629  BNP 2,653.1* 734.2* 607.9*    ProBNP (last 3 results) No results for input(s): PROBNP in the last 8760 hours.   CBG: No results for input(s): GLUCAP in the last 168 hours.     Radiology Reports  No results found.     Antibiotics: Anti-infectives (From admission, onward)    Start     Dose/Rate Route Frequency Ordered Stop   03/12/21 1000  amoxicillin-clavulanate (AUGMENTIN) 875-125 MG per tablet 1 tablet        1  tablet Oral Every 12 hours 03/12/21 0832 03/19/21 0935   03/08/21 0700  vancomycin (VANCOREADY) IVPB 1250 mg/250 mL  Status:  Discontinued        1,250 mg 166.7 mL/hr over 90 Minutes Intravenous Every 12 hours 03/07/21 1801 03/09/21 1320   03/08/21 0000  ceFEPIme (MAXIPIME) 2 g in sodium chloride  0.9 % 100 mL IVPB  Status:  Discontinued        2 g 200 mL/hr over 30 Minutes Intravenous Every 8 hours 03/07/21 1800 03/10/21 1201   03/07/21 1500  vancomycin (VANCOREADY) IVPB 1500 mg/300 mL        1,500 mg 150 mL/hr over 120 Minutes Intravenous  Once 03/07/21 1455 03/08/21 0118   03/07/21 1500  ceFEPIme (MAXIPIME) 2 g in sodium chloride 0.9 % 100 mL IVPB        2 g 200 mL/hr over 30 Minutes Intravenous  Once 03/07/21 1455 03/07/21 1628         DVT prophylaxis: Apixaban  Code Status: Full code  Family Communication: No family at bedside   Consultants: Cardiology  Procedures:     Objective    Physical Examination:  General-appears in no acute distress Heart-S1-S2, regular, no murmur auscultated Lungs-clear to auscultation bilaterally, no wheezing or crackles auscultated Abdomen-soft, nontender, no organomegaly Extremities-no edema in the lower extremities Neuro-alert, oriented x3, no focal deficit noted  Status is: Inpatient  Dispo: The patient is from: Home              Anticipated d/c is to: Home              Anticipated d/c date is: 03/29/2021              Patient currently not stable for discharge  Barrier to discharge-ongoing treatment of acute systolic heart failure  [id number] Labs  No results for input(s): DDIMER, FERRITIN, LDH, CRP in the last 72 hours.  Lab Results  Component Value Date   SARSCOV2NAA NEGATIVE 03/07/2021   [place] NEGATIVE 01/08/2021   [place] NEGATIVE 12/04/2020   [place] NEGATIVE 11/14/2020        [place]   [facility] If 7PM-7AM, please contact night-coverage at [email or web address], Office   [phone]   03/25/2021, 10:36 AM  LOS: 17 days

## 2021-03-26 DIAGNOSIS — I10 Essential (primary) hypertension: Secondary | ICD-10-CM | POA: Diagnosis not present

## 2021-03-26 DIAGNOSIS — R531 Weakness: Secondary | ICD-10-CM | POA: Diagnosis not present

## 2021-03-26 DIAGNOSIS — I5022 Chronic systolic (congestive) heart failure: Secondary | ICD-10-CM | POA: Diagnosis not present

## 2021-03-26 DIAGNOSIS — J9621 Acute and chronic respiratory failure with hypoxia: Secondary | ICD-10-CM | POA: Diagnosis not present

## 2021-03-26 DIAGNOSIS — E43 Unspecified severe protein-calorie malnutrition: Secondary | ICD-10-CM | POA: Diagnosis not present

## 2021-03-26 DIAGNOSIS — J690 Pneumonitis due to inhalation of food and vomit: Secondary | ICD-10-CM | POA: Diagnosis not present

## 2021-03-26 DIAGNOSIS — R7881 Bacteremia: Secondary | ICD-10-CM | POA: Diagnosis not present

## 2021-03-26 LAB — CBC
HCT: 28.9 % — ABNORMAL LOW (ref 39.0–52.0)
Hemoglobin: 8.8 g/dL — ABNORMAL LOW (ref 13.0–17.0)
MCH: 24.3 pg — ABNORMAL LOW (ref 26.0–34.0)
MCHC: 30.4 g/dL (ref 30.0–36.0)
MCV: 79.8 fL — ABNORMAL LOW (ref 80.0–100.0)
Platelets: 325 10*3/uL (ref 150–400)
RBC: 3.62 MIL/uL — ABNORMAL LOW (ref 4.22–5.81)
RDW: 18.9 % — ABNORMAL HIGH (ref 11.5–15.5)
WBC: 9.4 10*3/uL (ref 4.0–10.5)
nRBC: 0 % (ref 0.0–0.2)

## 2021-03-26 LAB — COOXEMETRY PANEL
Carboxyhemoglobin: 1.7 % — ABNORMAL HIGH (ref 0.5–1.5)
Carboxyhemoglobin: 1.9 % — ABNORMAL HIGH (ref 0.5–1.5)
Methemoglobin: 1.1 % (ref 0.0–1.5)
Methemoglobin: 0.9 % (ref 0.0–1.5)
O2 Saturation: 48.9 %
O2 Saturation: 51.2 %
Total hemoglobin: 7.3 g/dL — ABNORMAL LOW (ref 12.0–16.0)
Total hemoglobin: 8.9 g/dL — ABNORMAL LOW (ref 12.0–16.0)

## 2021-03-26 LAB — BASIC METABOLIC PANEL
Anion gap: 8 (ref 5–15)
BUN: 23 mg/dL (ref 8–23)
CO2: 31 mmol/L (ref 22–32)
Calcium: 8.9 mg/dL (ref 8.9–10.3)
Chloride: 92 mmol/L — ABNORMAL LOW (ref 98–111)
Creatinine, Ser: 0.68 mg/dL (ref 0.61–1.24)
GFR, Estimated: >60 mL/min (ref >60)
Glucose, Bld: 159 mg/dL — ABNORMAL HIGH (ref 70–99)
Potassium: 3.8 mmol/L (ref 3.5–5.1)
Sodium: 131 mmol/L — ABNORMAL LOW (ref 135–145)

## 2021-03-26 MED ORDER — CLONAZEPAM 0.25 MG PO TBDP
0.2500 mg | ORAL_TABLET | Freq: Two times a day (BID) | ORAL | Status: DC
  Administered 2021-03-26 – 2021-04-05 (×22): 0.25 mg via ORAL
  Filled 2021-03-26 (×22): qty 1

## 2021-03-26 MED ORDER — PROSOURCE PLUS PO LIQD
30.0000 mL | Freq: Four times a day (QID) | ORAL | Status: DC
  Administered 2021-03-26 – 2021-04-05 (×42): 30 mL via ORAL
  Filled 2021-03-26 (×41): qty 30

## 2021-03-26 MED ORDER — CLONAZEPAM 0.125 MG PO TBDP: 0.2500 mg | ORAL_TABLET | Freq: Two times a day (BID) | ORAL | Status: DC

## 2021-03-26 MED ORDER — ENSURE ENLIVE PO LIQD
237.0000 mL | Freq: Three times a day (TID) | ORAL | Status: DC
  Administered 2021-03-26 – 2021-03-27 (×3): 237 mL via ORAL

## 2021-03-26 MED ORDER — FUROSEMIDE 10 MG/ML IJ SOLN
40.0000 mg | Freq: Every day | INTRAMUSCULAR | Status: DC
  Administered 2021-03-26: 40 mg via INTRAVENOUS
  Filled 2021-03-26: qty 4

## 2021-03-26 NOTE — Progress Notes (Addendum)
Brief VAD coordinator rounding note:   Met with patient at bedside this morning. No questions at this time about upcoming VAD implant.   Reports recurrent orthopnea. Co-ox 48.9 this morning. Per Dr Phillip Heal will increase Milrinone to 0.25 mcg/kg/min. Will also give 1 dose IV Lasix today. May need full RHC later this week.  WBC up to 18.9 over the weekend. Afebrile. WBC 9.4 this morning.   Pt reports he has been trying to increase PO intake, drinking all his nutritional supplements and protein drinks. States it is hard to eat at times due to poor appetite. Pre-albumin 15>13. Will continue to monitor nutritional status, and will provide further intervention (possible CoreTrack feeding tube) if needed.   Pt shares he feels his strength is improving with walking in the hallway. Encouraged to continue to increase mobility as tolerated. He verbalized understanding.   Emerson Monte RN [place] Coordinator  Office: [phone]  24/7 Pager: [phone]

## 2021-03-26 NOTE — Progress Notes (Signed)
Nutrition Follow-up  DOCUMENTATION CODES:   Severe malnutrition in context of chronic illness  INTERVENTION:   - 48-hour calorie count to start today at 1100, RD to follow up tomorrow with day 1 results  - Liberalize diet to Regular with 2000 ml fluid restriction, verbal with readback order placed per Dr. [name]  - Increase Ensure Enlive po to TID, each supplement provides 350 kcal and 20 grams of protein   - Increase ProSource Plus 30 ml po to QID, each supplement provides 100 kcal and 15 grams of protein   - Continue MVI with minerals daily   - Encourage PO intake  NUTRITION DIAGNOSIS:   Severe Malnutrition related to chronic illness (CHF) as evidenced by severe muscle depletion, severe fat depletion.  Ongoing, being addressed via oral nutrition supplements, diet liberalization  GOAL:   Patient will meet greater than or equal to 90% of their needs  Progressing  MONITOR:   PO intake, Supplement acceptance, Weight trends, Labs, I & O's  REASON FOR ASSESSMENT:   Consult Enteral/tube feeding initiation and management, Assessment of nutrition requirement/status  ASSESSMENT:   65 y.o. male presented to the ED with sudden onset of dyspnea. PMH includes CAD, CHF with ICD, HTN, and on 2L [place] at baseline. Pt admitted with respiratory failure with hypoxia with SIRS.  RD consulted to reassess pt and provide recommendations to improve nutrition as pt's prealbumin decreased from 15>13. Prealbumin is strongly affected by stress response and inflammatory process; therefore, a marked improvement in this lab value during acute hospitalization may not be achievable. As inflammation and stress response improve, can expect prealbumin to improve. Previous 72-hour calorie count demonstrated that pt was meeting >100% of estimated kcal and protein needs on all days of calorie count. A new 48-hour calorie count has been ordered. RD placed calorie count envelope on door and will follow up  tomorrow to provide day 1 results. RN and pt aware of calorie count. Of note, pt consumed 100% of breakfast meal this morning and 100% of lunch meal while RD was in room.  Pt discouraged with drop in prealbumin. Discussed the above information with pt and provided encouragement. Pt would like to increase supplement frequency to further optimize nutrition. Will increase Ensure from BID to TID and will increase ProSource from TID to QID. Pt in agreement.  Pt reports occasional issues with nausea at mealtimes but that this may have been related to constipation and feeling very full and like "I couldn't put anything else in me." Pt states nausea improved with IV Zofran but that he still didn't eat the meal whenever this happened. Pt reports that this is occurring once a day every 2-3 days. Pt reports having a bowel movement last night that required some straining to pass but was softer than the bowel movement that he had last Thursday. Pt now on miralax and senna. If nausea at mealtimes persists, consider scheduling IV Zofran prior to mealtimes. Recommend continuing bowel regimen.  Will continue to monitor for need to place Cortrak and provide nocturnal tube feeds. Pt would like to avoid this if possible and is willing to work hard to take in adequate PO.  Admit weight: 76.9 kg Current weight: 77.7 kg  Meal Completion: 60-100%  Medications reviewed and include: ProSource Plus po TID, Ensure Enlive BID, IV lasix 40 mg daily, melatonin, MVI with minerals daily, protonix, miralax, klor-con 40 mEq BID, senna, amiodarone drip, milrinone drip  Labs reviewed: sodium 131, prealbumin 13.0 on 11/13, hemoglobin 8.8  UOP: 1820 ml x 24 hours I/O's: -14.2 L since admit  Diet Order:   Diet Order             Diet regular Room service appropriate? Yes; Fluid consistency: Thin; Fluid restriction: 2000 mL Fluid  Diet effective now                   EDUCATION NEEDS:   No education needs have been  identified at this time  Skin:  Skin Assessment: Reviewed RN Assessment  Last BM:  03/25/21 medium type 6  Height:   Ht Readings from Last 1 Encounters:  03/07/21 6' (1.829 m)    Weight:   Wt Readings from Last 1 Encounters:  03/26/21 77.7 kg    Ideal Body Weight:  80.9 kg  BMI:  Body mass index is 23.23 kg/m.  Estimated Nutritional Needs:   Kcal:  2300-2500  Protein:  115-130 grams  Fluid:  >/= 2 L    Gustavus Bryant, MS, RD, LDN Inpatient Clinical Dietitian Please see AMiON for contact information.

## 2021-03-26 NOTE — Progress Notes (Signed)
Patient ID: BREVYN RING, male   DOB: January 31, 1956, 65 y.o.   MRN: [id number]    Progress Note from the Palliative Medicine Team at [facility]   Patient Name: LORRIE STRAUCH        Date: 03/26/2021 DOB: 08/31/1955  Age: 65 y.o. MRN#: [id number] Attending Physician: Oswald Hillock, MD Primary Care Physician: [name], MD Admit Date: 03/07/2021   Medical records reviewed   65 y.o. male   admitted on 03/07/2021 with past medical history significant of CAD with previous MI, chronic systolic CHF/ICM w/ [place] [place] ICD that was extracted 7/22, moderate aortic stenosis, HTN, HLD, hx of recent right UE DVT who presents with sudden onset dyspnea.   Recent bacteremia with icd removal 7/22, s/p 6 weeks ceftriaxone.  Admitted with sepsis.  ID consulted.   Chronic Systolic HFr EF/ EF 28%, CAD. Prealbumin low at 11.2.    VAD w/u underway.    This NP visited patient at the bedside as a follow up for palliative medicine needs and emotional support.  Patient continues with full medical support in preparation/ anticipation of LVAD.  Possible VAD of November.  Patient working with PT/OT and dietitian.  Created space and opportunity for Mr. Washam to explore his thoughts and feelings regarding his current medical situation.    Mr. Lindblad remains optimistic, "I am doing everything I can to make this happen".  He verbalizes appreciation for the visit.    Discussed with patient the importance of continued conversation with his family and the  medical providers regarding overall plan of care and treatment options,  ensuring decisions are within the context of the patients values and GOCs.  Questions and concerns addressed   PMT will continue to support holistically  Total time spent on the unit was 25 minutes  Greater than 50% of the time was spent in counseling and coordination of care  Wadie Lessen NP  Palliative Medicine Team Team Phone # [phone] Pager [phone]

## 2021-03-26 NOTE — Progress Notes (Signed)
Calorie Count Note: Day 1 Results  48-hour calorie count ordered. Calorie count started yesterday (03/26/21) at 1100 with lunch meal.  Diet: regular with 2000 ml fluid restriction Supplements:  - Ensure Enlive TID - ProSource Plus QID  Day 1: 11/14 Lunch: 309 kcal, 12 grams of protein 11/14 Dinner: 502 kcal, 28 grams of protein 11/15 Breakfast: 540 kcal, 9 grams of protein Supplements: 1450 kcal, 180 grams of protein  Day 1 total 24-hour intake: 2801 kcal (>100% of minimum estimated needs)  229 grams of protein (>100% of minimum estimated needs)  Nutrition Diagnosis: Severe Malnutrition related to chronic illness (CHF) as evidenced by severe muscle depletion, severe fat depletion.  Goal: Patient will meet greater than or equal to 90% of their needs.  Intervention:  - Continue 48-hour calorie count for 1 more day - Continue Ensure Enlive TID - Continue ProSource Plus QID - Continue MVI with minerals daily - Continue liberalized Regular diet   Gustavus Bryant, MS, RD, LDN Inpatient Clinical Dietitian Please see AMiON for contact information.

## 2021-03-26 NOTE — Progress Notes (Addendum)
Patient ID: Brandon [name], male   DOB: 01-Jun-1955, 65 y.o.   MRN: [id number]     Advanced Heart Failure Rounding Note  PCP-Cardiologist: [name], MD   Subjective:    Admitted with sepsis. Started on cefepime + vanc, now off abx (ID following). Afebrile.  10/27 started on ivabradine 7.5 mg twice a day and midodrine 5 mg TID 10/29 Co-ox 48%, started on milrinone 0.25.  10/30 IV Antibiotics stopped. Blood cultures no growth for 5 days.  10/31 started on Augmentin for PNA.  11/1 Milrinone off 11/2 Milrinone added back. Midodrine increased to 10 mg TID 11/8 Placed on 80 mg po lasix  11/10 Developed Afib w/ RVR, converted with IV amio 11/13 Give 250 cc NS bolus for low CVP.   Remains on milrinone 0.125. Todays CO-OX 49%>51%   Pre-albumin 15 -> 13.    Yesterday walked 750 feet.   Unable to sleep in bed due to orthopnea. Feels ok in the chair.     Objective:   Weight Range: 77.7 kg Body mass index is 23.23 kg/m.   Vital Signs:   Temp:  [97.6 F (36.4 C)-98 F (36.7 C)] 97.8 F (36.6 C) (11/14 0830) Pulse Rate:  [102-112] 103 (11/14 0830) Resp:  [20-35] 24 (11/14 0830) BP: (90-104)/(61-74) 104/64 (11/14 0830) SpO2:  [90 %-99 %] 95 % (11/14 0830) Weight:  [77.7 kg] 77.7 kg (11/14 0550) Last BM Date: 03/22/21  Weight change: Filed Weights   03/24/21 0532 03/25/21 0430 03/26/21 0550  Weight: 75.7 kg 76.5 kg 77.7 kg    Intake/Output:   Intake/Output Summary (Last 24 hours) at 03/26/2021 0834 Last data filed at 03/26/2021 0739 Gross per 24 hour  Intake 1869.68 ml  Output 1820 ml  Net 49.68 ml     Physical Exam  CVP 2 General:   No resp difficulty HEENT: normal Neck: supple. no JVD. Carotids 2+ bilat; no bruits. No lymphadenopathy or thryomegaly appreciated. Cor: PMI nondisplaced. Regular rate & rhythm. No rubs, gallops. 2/6 SEM. Lungs: clear on 5 liters [place]  Abdomen: soft, nontender, nondistended. No hepatosplenomegaly. No bruits or masses. Good bowel  sounds. Extremities: no cyanosis, clubbing, rash, edema/ LUE PICC  Neuro: alert & orientedx3, cranial nerves grossly intact. moves all 4 extremities w/o difficulty. Affect pleasant   Telemetry  SR-ST 90-100s    Labs    CBC Recent Labs    03/25/21 0435 03/26/21 0435  WBC 12.8* 9.4  HGB 9.0* 8.8*  HCT 28.8* 28.9*  MCV 79.1* 79.8*  PLT 327 034   Basic Metabolic Panel Recent Labs    03/24/21 0223 03/25/21 0435 03/26/21 0435  NA 126* 128* 131*  K 3.9 3.5 3.8  CL 88* 88* 92*  CO2 28 29 31   GLUCOSE 226* 225* 159*  BUN 24* 22 23  CREATININE 0.64 0.64 0.68  CALCIUM 8.9 8.5* 8.9  MG 2.0  --   --    Liver Function Tests No results for input(s): AST, ALT, ALKPHOS, BILITOT, PROT, ALBUMIN in the last 72 hours.  No results for input(s): LIPASE, AMYLASE in the last 72 hours. Cardiac Enzymes No results for input(s): CKTOTAL, CKMB, CKMBINDEX, TROPONINI in the last 72 hours.  BNP: BNP (last 3 results) Recent Labs    11/30/20 0144 01/08/21 0731 01/18/21 1629  BNP 2,653.1* 734.2* 607.9*    ProBNP (last 3 results) No results for input(s): PROBNP in the last 8760 hours.    D-Dimer No results for input(s): DDIMER in the last 72 hours. Hemoglobin  A1C No results for input(s): HGBA1C in the last 72 hours. Fasting Lipid Panel No results for input(s): CHOL, HDL, LDLCALC, TRIG, CHOLHDL, LDLDIRECT in the last 72 hours. Thyroid Function Tests No results for input(s): TSH, T4TOTAL, T3FREE, THYROIDAB in the last 72 hours.  Invalid input(s): FREET3  Other results:   Imaging    No results found.   Medications:     Scheduled Medications:  (feeding supplement) PROSource Plus  30 mL Oral TID BM   apixaban  5 mg Oral BID   atorvastatin  80 mg Oral QPM   Chlorhexidine Gluconate Cloth  6 each Topical Daily   digoxin  0.125 mg Oral Daily   diphenhydrAMINE  50 mg Oral QHS   ezetimibe  10 mg Oral Daily   feeding supplement  237 mL Oral BID BM   gabapentin  200 mg Oral  TID   lidocaine  1 patch Transdermal Q24H   melatonin  5 mg Oral QHS   midodrine  10 mg Oral TID WC   multivitamin with minerals  1 tablet Oral Daily   pantoprazole  40 mg Oral Daily   polyethylene glycol  17 g Oral Daily   potassium chloride  40 mEq Oral BID   senna  2 tablet Oral BID   sertraline  100 mg Oral Daily   sodium chloride flush  10-40 mL Intracatheter Q12H   tamsulosin  0.4 mg Oral Daily    Infusions:  amiodarone 30 mg/hr (03/26/21 0826)   milrinone 0.125 mcg/kg/min (03/25/21 1900)     PRN Medications: acetaminophen **OR** acetaminophen, docusate sodium, guaiFENesin, ondansetron **OR** ondansetron (ZOFRAN) IV, sodium chloride flush    Patient Profile   Brandon [name] is a 65 year old with history of smoker, CAD s/p previous MI, HTN, HL, chronic HFeEF, sepsis 11/2020 bacteremia.  Admitted with sepsis. Bld Cx - NGTD    Assessment/Plan    1. Acute on chronic systolic HFr EF - Echo 08/19/68 EF 20-25% RV mildly HK. moderate AS  Mean gradient 13 AVA 1.2 cm2 DI 0.30 - RHC (7/22): EF 20%, low filling pressures. CI 2.3.  - NYHA IV at baseline - 11/2 Restarted 0.125 milrinone  - Co-ox 49% then repeat 51 %. Increase milrinone 0.25 mcg.  - CVP 2-3 but has orthopnea again. Give 40 mg IV lasix daily.  - No bb with low output HF - No ivabradine given AF   - Continue digoxin. Dig level ok - Continue midodrine 10 mg TID   - Discussed with [name] -  not a candidate for transplant.  - VAD w/u underway. Will try to support in house with milrinone, PT/OT and adequate nutrition.  - Initially planned for possible VAD in early December, date moved up to 11/25 - Pre-albumin dropping. Consult dietitian again.   2. Sepsis - H/o strep bacteremia 11/2020.  ICD extracted 12/04/20. Barostim was not removed as it is extravascular.  - Received IV antibiotics until 01/15/21. Blood cultures negative 01/30/21..  - Procalcitonin 0.22->0.11 - 10/26 -Blood CX x2  No growth 5 days.  - Ct chest  concerning for pneumonia. Treated w/ cefepime/vancomycin -> Augmentin (stop 11/7) - Afebrile.   3. Acute Hypoxic Respiratory Failure  - CXR with pulmonary edema initially, and was diuresed with IV Lasix.  -  CT chest completed 10/30 concerning for bronchopneumonia. On augmentin.  - Sats stable on 4 liters [place].    4. CAD  s/p anterior STEMI (12/20). LHC showed chronically occluded RCA (with L>>R collaterals) and thrombotic  occlusion of proximal LAD. Underwent PCI of LAD. - LHC (7/22) with stable CAD.  - No s/s angina - Continue atorvastatin 80. No aspirin d/t anticoagulation.  5. 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 H./O RUE DVT - On eliquis. Will need to change to heparin as we get closer to VAD  7. Severe protein-calorie malnutrition - prealbumin 11>15> 13 despite adequate caloric intake on calorie count - Pre-albumin dropping. Will re-visit nighttime Cor-trak feeds with Nutrition vs other options  8. Microcytic anemia - MCV 79.  - Iron Sats 13%.  - Rec'd Feraheme 11/8 and 11/13   9. Paroxysmal Atrial Fibrillation w/ RVR - new, developed 11/10. Now back in NSR   - Maintaining NSR. Continue amio gtt 30/hr while on milrinone - Eliquis 5 mg bid  - Keep K > 4.0 and Mg >2.0   10. Hypokalemia - K 3.8. Continue to supp   11. Hyponatremia - Na 126 -> 128->131  - restrict free water    Length of Stay: Vina, NP  03/26/2021, 8:34 AM  Advanced Heart Failure Team Pager [phone] (M-F; 7a - 5p)  Please contact [facility] for night-coverage after hours (5p -7a ) and weekends on [email or web address]   Patient seen and examined with the above-signed Advanced Practice Provider and/or Housestaff. I personally reviewed laboratory data, imaging studies and relevant notes. I independently examined the patient and formulated the important aspects of the plan. I have edited the note to reflect any of my changes  or salient points. I have personally discussed the plan with the patient and/or family.  Remains on milrinone 0.125. Co-ox down. CVP 2 but has recurrent orthopnea.   General:  Sitting in chair No resp difficulty HEENT: normal Neck: supple. no JVD. Carotids 2+ bilat; no bruits. No lymphadenopathy or thryomegaly appreciated. Cor: PMI nondisplaced. Regular rate & rhythm. 2/6 SEM Lungs: clear Abdomen: soft, nontender, nondistended. No hepatosplenomegaly. No bruits or masses. Good bowel sounds. Extremities: no cyanosis, clubbing, rash, edema Neuro: alert & orientedx3, cranial nerves grossly intact. moves all 4 extremities w/o difficulty. Affect pleasant  Remains tenuous. Will increase milrinone. Give 1 dose IV lasix. May need full RHC this week. Await Nutrition recs.   Glori Bickers, MD  10:21 AM

## 2021-03-26 NOTE — Progress Notes (Signed)
Mobility Specialist Progress Note    03/26/21 1500  Mobility  Activity Ambulated in hall  Level of Assistance Modified independent, requires aide device or extra time  Assistive Device  (IV pole)  Distance Ambulated (ft) 470 ft  Mobility Ambulated independently in hallway  Mobility Response Tolerated well  Mobility performed by Mobility specialist  Bed Position Chair  $Mobility charge 1 Mobility   Pt received in chair and agreeable. No complaints on walk and took no rest breaks. Encouraged focus on pursed lip breathing and SpO2 recovered without stopping when below 88%. SpO2 ranged from 82-90% during. Ambulated on 4LO2. Returned to chair with call bell in reach and sister present in room.   Hildred Alamin Mobility Specialist  Mobility Specialist Phone: [phone]

## 2021-03-26 NOTE — Progress Notes (Signed)
[facility]  PROGRESS NOTE  AKAASH VANDEWATER WLN:[id number] DOB: March 21, 1956 DOA: 03/07/2021 PCP: [name], MD   Brief HPI:   65 year old male with past medical history of coronary artery disease, chronic systolic heart failure with AICD extracted on 12/01/2020 due to infection, completed antibiotics on 01/15/2021 with Rocephin, moderate aortic stenosis, right upper extremity DVT came to ED with acute onset of dyspnea.  Was found to be hypoxemic, placed on 2 L/min of oxygen found to be in acute decompensated heart failure.  Cardiology was consulted.  Significant Events: 10/29 Co-ox 48%, started on milrinone 0.25.  10/30 IV Antibiotics stopped. Blood cultures no growth for 5 days.  10/31 started on augmentin. Milrinone cut back to 0.125 mcg.  03/12/2021 started on midodrine  03/24/2021 started back on milrinone   Antibiotics: 03/07/2021 IV Vanco and cefepime till 03/10/2021 03/11/2021 Augmentin  Microbiology data: 03/07/2021 blood culture: Remain negative till date   Procedures: None  Subjective   Patient seen and examined.  Could not sleep in bed due to orthopnea .Sitting in chair this morning.   Assessment/Plan:   Pneumonia/sepsis -Initially started on vancomycin and cefepime which was stopped on 10/30 -High-resolution CT scan of the chest showed bronchopneumonia, so ID started him back on Augmentin for 7 more days -Completed Augmentin course on 11/7 -At this time he is off antibiotics   Acute respiratory failure with hypoxemia due to acute on chronic systolic heart failure -Last 2D echo in July 2022 showed EF 20% -Currently on ivabradine,  digoxin, midodrine, milrinone -Cardiology has been treating his CHF with intermittent IV Lasix -Currently IV Lasix has been stopped and patient started on Demadex 40 mg p.o. 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 fib  -Started on amiodarone infusion -Converted back to normal sinus rhythm -Patient is on anticoagulation with apixaban  Hypokalemia -Replete  Hyponatremia -Today sodium has improved to 131 -Likely in the setting of CHF exacerbation -Also on diuretics -We will closely monitor patient's sodium level.   Scheduled medications:    (feeding supplement) PROSource Plus  30 mL Oral TID BM   apixaban  5 mg Oral BID   atorvastatin  80 mg Oral QPM   Chlorhexidine Gluconate Cloth  6 each Topical Daily   clonazepam  0.25 mg Oral BID   digoxin  0.125 mg Oral Daily   diphenhydrAMINE  50 mg Oral QHS   ezetimibe  10 mg Oral Daily   feeding supplement  237 mL Oral BID BM   furosemide  40 mg Intravenous Daily   gabapentin  200 mg Oral TID   lidocaine  1 patch Transdermal Q24H   melatonin  5 mg Oral QHS   midodrine  10 mg Oral TID WC   multivitamin with minerals  1 tablet Oral Daily   pantoprazole  40 mg Oral Daily   polyethylene glycol  17 g Oral Daily   potassium chloride  40 mEq Oral BID   senna  2 tablet Oral BID   sertraline  100 mg Oral Daily   sodium chloride flush  10-40 mL Intracatheter Q12H   tamsulosin  0.4 mg Oral Daily     Data Reviewed:   CBG:  No results for input(s): GLUCAP in the last 168 hours.  SpO2: 95 % O2 Flow Rate (L/min): 5 L/min    Vitals:   03/26/21 0407 03/26/21 0550 03/26/21 0830 03/26/21 0913  BP: 96/71  104/64   Pulse: (!) 102  (!) 103 (!) 104  Resp: (!) 23  (!) 24   Temp: 97.6 F (36.4 C)  97.8 F (36.6 C)   TempSrc: Oral   Oral   SpO2: 92%  95%   Weight:  77.7 kg    Height:         Intake/Output Summary (Last 24 hours) at 03/26/2021 1151 Last data filed at 03/26/2021 0917 Gross per 24 hour  Intake 1222.99 ml  Output 1620 ml  Net -397.01 ml    11/12 1901 - 11/14 0700 In: 2112.8 [P.O.:1434; I.V.:678.8] Out: 2395 [Urine:2395]  Filed Weights   03/24/21 0532 03/25/21 0430 03/26/21 0550  Weight: 75.7 kg 76.5 kg 77.7 kg    Data Reviewed: Basic Metabolic Panel: Recent Labs  Lab 03/22/21 0630 03/22/21 1816 03/23/21 0415 03/24/21 0223 03/25/21 0435 03/26/21 0435  NA  --  131* 134* 126* 128* 131*  K  --  3.2* 4.1 3.9 3.5 3.8  CL  --  92* 96* 88* 88* 92*  CO2  --  28 31 28 29 31   GLUCOSE  --  275* 111* 226* 225* 159*  BUN  --  23 25* 24* 22 23  CREATININE  --  0.74 0.55* 0.64 0.64 0.68  CALCIUM  --  8.6* 9.0 8.9 8.5* 8.9  MG 1.9  --   --  2.0  --   --    Liver Function Tests: No results for input(s): AST, ALT, ALKPHOS, BILITOT, PROT, ALBUMIN in the last 168 hours. No results for input(s): LIPASE, AMYLASE in the last 168 hours. No results for input(s): AMMONIA in the last 168 hours. CBC: Recent Labs  Lab 03/24/21 0223 03/25/21 0435 03/26/21 0435  WBC 18.9* 12.8* 9.4  HGB 9.4* 9.0* 8.8*  HCT 29.9* 28.8* 28.9*  MCV 79.3* 79.1* 79.8*  PLT 366 327 325   Cardiac Enzymes: No results for input(s): CKTOTAL, CKMB, CKMBINDEX, TROPONINI in the last 168 hours. BNP (last 3 results) Recent Labs    11/30/20 0144 01/08/21 0731 01/18/21 1629  BNP 2,653.1* 734.2* 607.9*    ProBNP (last 3 results) No results for input(s): PROBNP in the last 8760 hours.   CBG: No results for input(s): GLUCAP in the last 168 hours.     Radiology Reports  No results found.     Antibiotics: Anti-infectives (From admission, onward)    Start     Dose/Rate Route Frequency Ordered Stop   03/12/21 1000  amoxicillin-clavulanate (AUGMENTIN) 875-125 MG per tablet 1 tablet        1 tablet Oral Every 12  hours 03/12/21 0832 03/19/21 0935   03/08/21 0700  vancomycin (VANCOREADY) IVPB 1250 mg/250 mL  Status:  Discontinued        1,250 mg 166.7 mL/hr over 90 Minutes Intravenous Every 12 hours 03/07/21 1801 03/09/21 1320   03/08/21 0000  ceFEPIme (MAXIPIME) 2 g in sodium chloride 0.9 % 100 mL IVPB  Status:  Discontinued        2 g 200 mL/hr over 30 Minutes Intravenous Every 8 hours 03/07/21 1800 03/10/21 1201   03/07/21 1500  vancomycin (VANCOREADY) IVPB  1500 mg/300 mL        1,500 mg 150 mL/hr over 120 Minutes Intravenous  Once 03/07/21 1455 03/08/21 0118   03/07/21 1500  ceFEPIme (MAXIPIME) 2 g in sodium chloride 0.9 % 100 mL IVPB        2 g 200 mL/hr over 30 Minutes Intravenous  Once 03/07/21 1455 03/07/21 1628         DVT prophylaxis: Apixaban  Code Status: Full code  Family Communication: No family at bedside   Consultants: Cardiology  Procedures:     Objective    Physical Examination:  General-appears in no acute distress Heart-S1-S2, regular, no murmur auscultated Lungs-clear to auscultation bilaterally, no wheezing or crackles auscultated Abdomen-soft, nontender, no organomegaly Extremities-no edema in the lower extremities Neuro-alert, oriented x3, no focal deficit noted  Status is: Inpatient  Dispo: The patient is from: Home              Anticipated d/c is to: Home              Anticipated d/c date is: 03/29/2021              Patient currently not stable for discharge  Barrier to discharge-ongoing treatment of acute systolic heart failure  [id number] Labs  No results for input(s): DDIMER, FERRITIN, LDH, CRP in the last 72 hours.  Lab Results  Component Value Date   SARSCOV2NAA NEGATIVE 03/07/2021   [place] NEGATIVE 01/08/2021   [place] NEGATIVE 12/04/2020   [place] NEGATIVE 11/14/2020        [place]   [facility] If 7PM-7AM, please contact night-coverage at [email or web address], Office  [phone]   03/26/2021, 11:51  AM  LOS: 18 days

## 2021-03-26 NOTE — Progress Notes (Signed)
Mobility Specialist Progress Note    03/26/21 1045  Mobility  Activity Ambulated in hall  Level of Assistance Modified independent, requires aide device or extra time  Assistive Device  (IV pole)  Distance Ambulated (ft) 410 ft  Mobility Ambulated independently in hallway  Mobility Response Tolerated well  Mobility performed by Mobility specialist  Bed Position Chair  $Mobility charge 1 Mobility   During Mobility: 110 HR Post-Mobility: 97 HR  Pt received in chair and agreeable. Pt requested we go on 2 shorter walks today. Focused on pursed lip breathing. Ambulated on 4LO2. SpO2 stayed in the 80s but pt did not feel any significant SOB or any other symptoms. Returned to chair with call bell in reach.   Will f/u this afternoon for a second walk.  Hildred Alamin Mobility Specialist  Mobility Specialist Phone: [phone]

## 2021-03-27 DIAGNOSIS — J690 Pneumonitis due to inhalation of food and vomit: Secondary | ICD-10-CM | POA: Diagnosis not present

## 2021-03-27 DIAGNOSIS — I5022 Chronic systolic (congestive) heart failure: Secondary | ICD-10-CM | POA: Diagnosis not present

## 2021-03-27 DIAGNOSIS — I10 Essential (primary) hypertension: Secondary | ICD-10-CM | POA: Diagnosis not present

## 2021-03-27 DIAGNOSIS — J9621 Acute and chronic respiratory failure with hypoxia: Secondary | ICD-10-CM | POA: Diagnosis not present

## 2021-03-27 LAB — COOXEMETRY PANEL
Carboxyhemoglobin: 1.5 % (ref 0.5–1.5)
Methemoglobin: 0.6 % (ref 0.0–1.5)
O2 Saturation: 53.8 %
Total hemoglobin: 15.9 g/dL (ref 12.0–16.0)

## 2021-03-27 LAB — BASIC METABOLIC PANEL
Anion gap: 8 (ref 5–15)
BUN: 24 mg/dL — ABNORMAL HIGH (ref 8–23)
CO2: 31 mmol/L (ref 22–32)
Calcium: 8.9 mg/dL (ref 8.9–10.3)
Chloride: 91 mmol/L — ABNORMAL LOW (ref 98–111)
Creatinine, Ser: 0.6 mg/dL — ABNORMAL LOW (ref 0.61–1.24)
GFR, Estimated: >60 mL/min (ref >60)
Glucose, Bld: 175 mg/dL — ABNORMAL HIGH (ref 70–99)
Potassium: 4.1 mmol/L (ref 3.5–5.1)
Sodium: 130 mmol/L — ABNORMAL LOW (ref 135–145)

## 2021-03-27 LAB — CBC
HCT: 27.6 % — ABNORMAL LOW (ref 39.0–52.0)
Hemoglobin: 8.4 g/dL — ABNORMAL LOW (ref 13.0–17.0)
MCH: 24.3 pg — ABNORMAL LOW (ref 26.0–34.0)
MCHC: 30.4 g/dL (ref 30.0–36.0)
MCV: 79.8 fL — ABNORMAL LOW (ref 80.0–100.0)
Platelets: 318 10*3/uL (ref 150–400)
RBC: 3.46 MIL/uL — ABNORMAL LOW (ref 4.22–5.81)
RDW: 19 % — ABNORMAL HIGH (ref 11.5–15.5)
WBC: 10.6 10*3/uL — ABNORMAL HIGH (ref 4.0–10.5)
nRBC: 0 % (ref 0.0–0.2)

## 2021-03-27 LAB — MAGNESIUM: Magnesium: 2.1 mg/dL (ref 1.7–2.4)

## 2021-03-27 MED ORDER — ENSURE ENLIVE PO LIQD
237.0000 mL | Freq: Three times a day (TID) | ORAL | Status: DC
  Administered 2021-03-27 – 2021-04-05 (×23): 237 mL via ORAL

## 2021-03-27 MED ORDER — SODIUM CHLORIDE 0.9% FLUSH
3.0000 mL | Freq: Two times a day (BID) | INTRAVENOUS | Status: DC
  Administered 2021-03-27 – 2021-04-05 (×6): 3 mL via INTRAVENOUS

## 2021-03-27 MED ORDER — FUROSEMIDE 10 MG/ML IJ SOLN
60.0000 mg | Freq: Two times a day (BID) | INTRAMUSCULAR | Status: DC
  Administered 2021-03-27 – 2021-03-28 (×4): 60 mg via INTRAVENOUS
  Filled 2021-03-27 (×6): qty 6

## 2021-03-27 NOTE — Progress Notes (Signed)
[facility]  PROGRESS NOTE  Brandon [name] NWG:[id number] DOB: Nov 07, 1955 DOA: 03/07/2021 PCP: [name], MD   Brief HPI:   65 year old male with past medical history of coronary artery disease, chronic systolic heart failure with AICD extracted on 12/01/2020 due to infection, completed antibiotics on 01/15/2021 with Rocephin, moderate aortic stenosis, right upper extremity DVT came to ED with acute onset of dyspnea.  Was found to be hypoxemic, placed on 2 L/min of oxygen found to be in acute decompensated heart failure.  Cardiology was consulted.  Significant Events: 10/29 Co-ox 48%, started on milrinone 0.25.  10/30 IV Antibiotics stopped. Blood cultures no growth for 5 days.  10/31 started on augmentin. Milrinone cut back to 0.125 mcg.  03/12/2021 started on midodrine  03/24/2021 started back on milrinone   Antibiotics: 03/07/2021 IV Vanco and cefepime till 03/10/2021 03/11/2021 Augmentin  Microbiology data: 03/07/2021 blood culture: Remain negative till date   Procedures: None  Subjective   Patient seen and examined, denies shortness of breath at this time.  Was not able to sleep in bed last night.  Sitting in chair at this time.   Assessment/Plan:   Pneumonia/sepsis -Initially started on vancomycin and cefepime which was stopped on 10/30 -High-resolution CT scan of the chest showed bronchopneumonia, so ID started him back on Augmentin for 7 more days -Completed Augmentin course on 11/7 -At this time he is off antibiotics   Acute respiratory failure with hypoxemia due to acute on chronic systolic heart failure -Last 2D echo in July 2022 showed EF 20% -Currently on ivabradine,  digoxin, midodrine, milrinone -Cardiology has been treating his CHF with intermittent IV Lasix -Currently IV Lasix has been stopped and patient started on Demadex 40 mg p.o. 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 fib  -Started on amiodarone infusion -Converted back to normal sinus rhythm -Patient is on anticoagulation with apixaban  Hypokalemia -Replete  Hyponatremia -Today sodium has improved to 130 -Likely in the setting of CHF exacerbation -Also on diuretics -We will closely monitor patient's sodium level.   Scheduled medications:    (feeding supplement) PROSource Plus  30 mL Oral QID   atorvastatin  80 mg Oral QPM   Chlorhexidine Gluconate Cloth  6 each Topical Daily   clonazepam  0.25 mg Oral BID   digoxin  0.125 mg Oral Daily   diphenhydrAMINE  50 mg Oral QHS   ezetimibe  10 mg Oral Daily   feeding supplement  237 mL Oral TID BM   furosemide  60 mg Intravenous BID   gabapentin  200 mg Oral TID   lidocaine  1 patch Transdermal Q24H   melatonin  5 mg Oral QHS   midodrine  10 mg Oral TID WC   multivitamin with minerals  1 tablet Oral Daily   pantoprazole  40 mg Oral Daily   polyethylene glycol  17 g Oral Daily   potassium chloride  40 mEq Oral BID   senna  2 tablet Oral BID   sertraline  100 mg Oral Daily  sodium chloride flush  10-40 mL Intracatheter Q12H   sodium chloride flush  3 mL Intravenous Q12H   tamsulosin  0.4 mg Oral Daily     Data Reviewed:   CBG:  No results for input(s): GLUCAP in the last 168 hours.  SpO2: 91 % O2 Flow Rate (L/min): 3.5 L/min    Vitals:   03/27/21 0500 03/27/21 0750 03/27/21 0926 03/27/21 1101  BP:  95/61  91/66  Pulse:  (!) 107 (!) 105 (!) 102  Resp:  (!) 22  (!) 23  Temp:   97.8 F (36.6 C)  97.8 F (36.6 C)  TempSrc:  Oral  Oral  SpO2:  93%  91%  Weight: 78.7 kg     Height:         Intake/Output Summary (Last 24 hours) at 03/27/2021 1515 Last data filed at 03/27/2021 1400 Gross per 24 hour  Intake 1197.8 ml  Output 3325 ml  Net -2127.2 ml    11/13 1901 - 11/15 0700 In: 1404.2 [P.O.:716; I.V.:688.2] Out: 2945 [Urine:2945]  Filed Weights   03/25/21 0430 03/26/21 0550 03/27/21 0500  Weight: 76.5 kg 77.7 kg 78.7 kg    Data Reviewed: Basic Metabolic Panel: Recent Labs  Lab 03/22/21 0630 03/22/21 1816 03/23/21 0415 03/24/21 0223 03/25/21 0435 03/26/21 0435 03/27/21 0400  NA  --    < > 134* 126* 128* 131* 130*  K  --    < > 4.1 3.9 3.5 3.8 4.1  CL  --    < > 96* 88* 88* 92* 91*  CO2  --    < > 31 28 29 31 31   GLUCOSE  --    < > 111* 226* 225* 159* 175*  BUN  --    < > 25* 24* 22 23 24*  CREATININE  --    < > 0.55* 0.64 0.64 0.68 0.60*  CALCIUM  --    < > 9.0 8.9 8.5* 8.9 8.9  MG 1.9  --   --  2.0  --   --  2.1   < > = values in this interval not displayed.   Liver Function Tests: No results for input(s): AST, ALT, ALKPHOS, BILITOT, PROT, ALBUMIN in the last 168 hours. No results for input(s): LIPASE, AMYLASE in the last 168 hours. No results for input(s): AMMONIA in the last 168 hours. CBC: Recent Labs  Lab 03/24/21 0223 03/25/21 0435 03/26/21 0435 03/27/21 0400  WBC 18.9* 12.8* 9.4 10.6*  HGB 9.4* 9.0* 8.8* 8.4*  HCT 29.9* 28.8* 28.9* 27.6*  MCV 79.3* 79.1* 79.8* 79.8*  PLT 366 327 325 318   Cardiac Enzymes: No results for input(s): CKTOTAL, CKMB, CKMBINDEX, TROPONINI in the last 168 hours. BNP (last 3 results) Recent Labs    11/30/20 0144 01/08/21 0731 01/18/21 1629  BNP 2,653.1* 734.2* 607.9*    ProBNP (last 3 results) No results for input(s): PROBNP in the last 8760 hours.   CBG: No results for input(s): GLUCAP in the last 168 hours.     Radiology Reports  No results  found.     Antibiotics: Anti-infectives (From admission, onward)    Start     Dose/Rate Route Frequency Ordered Stop   03/12/21 1000  amoxicillin-clavulanate (AUGMENTIN) 875-125 MG per tablet 1 tablet        1 tablet Oral Every 12 hours 03/12/21 0832 03/19/21 0935   03/08/21 0700  vancomycin (VANCOREADY) IVPB 1250 mg/250 mL  Status:  Discontinued  1,250 mg 166.7 mL/hr over 90 Minutes Intravenous Every 12 hours 03/07/21 1801 03/09/21 1320   03/08/21 0000  ceFEPIme (MAXIPIME) 2 g in sodium chloride 0.9 % 100 mL IVPB  Status:  Discontinued        2 g 200 mL/hr over 30 Minutes Intravenous Every 8 hours 03/07/21 1800 03/10/21 1201   03/07/21 1500  vancomycin (VANCOREADY) IVPB 1500 mg/300 mL        1,500 mg 150 mL/hr over 120 Minutes Intravenous  Once 03/07/21 1455 03/08/21 0118   03/07/21 1500  ceFEPIme (MAXIPIME) 2 g in sodium chloride 0.9 % 100 mL IVPB        2 g 200 mL/hr over 30 Minutes Intravenous  Once 03/07/21 1455 03/07/21 1628         DVT prophylaxis: Apixaban  Code Status: Full code  Family Communication: No family at bedside   Consultants: Cardiology  Procedures:     Objective    Physical Examination:  General-appears in no acute distress Heart-S1-S2, regular, no murmur auscultated Lungs-clear to auscultation bilaterally, no wheezing or crackles auscultated Abdomen-soft, nontender, no organomegaly Extremities-no edema in the lower extremities Neuro-alert, oriented x3, no focal deficit noted  Status is: Inpatient  Dispo: The patient is from: Home              Anticipated d/c is to: Home              Anticipated d/c date is: 04/06/2021              Patient currently not stable for discharge  Barrier to discharge-ongoing treatment of acute systolic heart failure  [id number] Labs  No results for input(s): DDIMER, FERRITIN, LDH, CRP in the last 72 hours.  Lab Results  Component Value Date   SARSCOV2NAA NEGATIVE 03/07/2021   [place]  NEGATIVE 01/08/2021   [place] NEGATIVE 12/04/2020   [place] NEGATIVE 11/14/2020        [place]   [facility] If 7PM-7AM, please contact night-coverage at [email or web address], Office  [phone]   03/27/2021, 3:15 PM  LOS: 19 days

## 2021-03-27 NOTE — Plan of Care (Signed)

## 2021-03-27 NOTE — H&P (View-Only) (Signed)
Patient ID: Brandon [name], male   DOB: 1956/01/26, 65 y.o.   MRN: [id number]     Advanced Heart Failure Rounding Note  PCP-Cardiologist: Brandon Ruths, MD   Subjective:    Admitted with sepsis. Started on cefepime + vanc, now off abx (ID following). Afebrile.  10/27 started on ivabradine 7.5 mg twice a day and midodrine 5 mg TID 10/29 Co-ox 48%, started on milrinone 0.25.  10/30 IV Antibiotics stopped. Blood cultures no growth for 5 days.  10/31 started on Augmentin for PNA.  11/1 Milrinone off 11/2 Milrinone added back. Midodrine increased to 10 mg TID 11/8 Placed on 80 mg po lasix  11/10 Developed Afib w/ RVR, converted with IV amio 11/13 Give 250 cc NS bolus for low CVP.  11/14 Milrinone increased to 0.25 mcg. Diuresed with IV lasix .    Yesterday diuresed with 40 mg IV lasix. Sluggish diuresis.   Milrinone 0.25 mcg. CO-OX 53%.   Pre-albumin 15 -> 13.     Unable to sleep in the bed. Denies SOB.     Objective:   Weight Range: 78.7 kg Body mass index is 23.53 kg/m.   Vital Signs:   Temp:  [97.6 F (36.4 C)-98.1 F (36.7 C)] 97.6 F (36.4 C) (11/15 0400) Pulse Rate:  [101-112] 101 (11/15 0400) Resp:  [24-27] 24 (11/15 0400) BP: (91-104)/(62-64) 92/64 (11/15 0400) SpO2:  [88 %-95 %] 91 % (11/15 0400) Weight:  [78.7 kg] 78.7 kg (11/15 0500) Last BM Date: 03/26/21  Weight change: Filed Weights   03/25/21 0430 03/26/21 0550 03/27/21 0500  Weight: 76.5 kg 77.7 kg 78.7 kg    Intake/Output:   Intake/Output Summary (Last 24 hours) at 03/27/2021 0734 Last data filed at 03/27/2021 0600 Gross per 24 hour  Intake 700.87 ml  Output 950 ml  Net -249.13 ml     Physical Exam  CVP 2-3 check in the chair.  General:  Sitting in the chair. No resp difficulty HEENT: normal Neck: supple. JVP 6-7 . Carotids 2+ bilat; no bruits. No lymphadenopathy or thryomegaly appreciated. Cor: PMI nondisplaced. Regular rate & rhythm. No rubs, gallops or murmurs. Lungs: Crackles 1/2  way up.  Abdomen: soft, nontender, nondistended. No hepatosplenomegaly. No bruits or masses. Good bowel sounds. Extremities: no cyanosis, clubbing, rash, R and LLE 1+ edema. LUE PICC  Neuro: alert & orientedx3, cranial nerves grossly intact. moves all 4 extremities w/o difficulty. Affect pleasant]  Telemetry  ST 100s personally reviewed.    Labs    CBC Recent Labs    03/26/21 0435 03/27/21 0400  WBC 9.4 10.6*  HGB 8.8* 8.4*  HCT 28.9* 27.6*  MCV 79.8* 79.8*  PLT 325 932   Basic Metabolic Panel Recent Labs    03/26/21 0435 03/27/21 0400  NA 131* 130*  K 3.8 4.1  CL 92* 91*  CO2 31 31  GLUCOSE 159* 175*  BUN 23 24*  CREATININE 0.68 0.60*  CALCIUM 8.9 8.9  MG  --  2.1   Liver Function Tests No results for input(s): AST, ALT, ALKPHOS, BILITOT, PROT, ALBUMIN in the last 72 hours.  No results for input(s): LIPASE, AMYLASE in the last 72 hours. Cardiac Enzymes No results for input(s): CKTOTAL, CKMB, CKMBINDEX, TROPONINI in the last 72 hours.  BNP: BNP (last 3 results) Recent Labs    11/30/20 0144 01/08/21 0731 01/18/21 1629  BNP 2,653.1* 734.2* 607.9*    ProBNP (last 3 results) No results for input(s): PROBNP in the last 8760 hours.  D-Dimer No results for input(s): DDIMER in the last 72 hours. Hemoglobin A1C No results for input(s): HGBA1C in the last 72 hours. Fasting Lipid Panel No results for input(s): CHOL, HDL, LDLCALC, TRIG, CHOLHDL, LDLDIRECT in the last 72 hours. Thyroid Function Tests No results for input(s): TSH, T4TOTAL, T3FREE, THYROIDAB in the last 72 hours.  Invalid input(s): FREET3  Other results:   Imaging    No results found.   Medications:     Scheduled Medications:  (feeding supplement) PROSource Plus  30 mL Oral QID   apixaban  5 mg Oral BID   atorvastatin  80 mg Oral QPM   Chlorhexidine Gluconate Cloth  6 each Topical Daily   clonazepam  0.25 mg Oral BID   digoxin  0.125 mg Oral Daily   diphenhydrAMINE  50 mg  Oral QHS   ezetimibe  10 mg Oral Daily   feeding supplement  237 mL Oral TID BM   furosemide  40 mg Intravenous Daily   gabapentin  200 mg Oral TID   lidocaine  1 patch Transdermal Q24H   melatonin  5 mg Oral QHS   midodrine  10 mg Oral TID WC   multivitamin with minerals  1 tablet Oral Daily   pantoprazole  40 mg Oral Daily   polyethylene glycol  17 g Oral Daily   potassium chloride  40 mEq Oral BID   senna  2 tablet Oral BID   sertraline  100 mg Oral Daily   sodium chloride flush  10-40 mL Intracatheter Q12H   tamsulosin  0.4 mg Oral Daily    Infusions:  amiodarone 30 mg/hr (03/27/21 0300)   milrinone 0.25 mcg/kg/min (03/27/21 0300)     PRN Medications: acetaminophen **OR** acetaminophen, docusate sodium, guaiFENesin, ondansetron **OR** ondansetron (ZOFRAN) IV, sodium chloride flush    Patient Profile   [name] is a 65 year old with history of smoker, CAD s/p previous MI, HTN, HL, chronic HFeEF, sepsis 11/2020 bacteremia.  Admitted with sepsis. Bld Cx - NGTD    Assessment/Plan    1. Acute on chronic systolic HFr EF - Echo 07/13/42 EF 20-25% RV mildly HK. moderate AS  Mean gradient 13 AVA 1.2 cm2 DI 0.30 - RHC (7/22): EF 20%, low filling pressures. CI 2.3.  - NYHA IV at baseline - 11/2 Restarted 0.125 milrinone  with increase to 0.25 mcg on 11/14  - Co-ox 54% on milrinone 0.25 mcg.  - CVP 2-3 but on exam he has crackles and low extremity edema.  - Increase lasix to 60 mg twice a d ay.  - No bb with low output HF - No ivabradine given AF   - Continue digoxin. Dig level ok - Continue midodrine 10 mg TID   - Discussed with [name] -  not a candidate for transplant.  - VAD w/u underway. Will try to support in house with milrinone, PT/OT and adequate nutrition.  - Initially planned for possible VAD in early December, date moved up to 11/25 -Dietitian appreciated.   2. Sepsis - H/o strep bacteremia 11/2020.  ICD extracted 12/04/20. Barostim was not removed as it is  extravascular.  - Received IV antibiotics until 01/15/21. Blood cultures negative 01/30/21..  - Procalcitonin 0.22->0.11 - 10/26 -Blood CX x2  No growth 5 days.  - Ct chest concerning for pneumonia. Treated w/ cefepime/vancomycin -> Augmentin (stop 11/7) - Afebrile.   3. Acute Hypoxic Respiratory Failure  - CXR with pulmonary edema initially, and was diuresed with IV Lasix.  -  CT chest completed 10/30 concerning for bronchopneumonia. On augmentin.  - Sats stable on 4 liters [place].    4. CAD  s/p anterior STEMI (12/20). LHC showed chronically occluded RCA (with L>>R collaterals) and thrombotic occlusion of proximal LAD. Underwent PCI of LAD. - LHC (7/22) with stable CAD.  - No s/s angina - Continue atorvastatin 80. No aspirin d/t anticoagulation.  5. 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 H./O RUE DVT - On eliquis. Will need to change to heparin as we get closer to VAD  7. Severe protein-calorie malnutrition - prealbumin 11>15> 13 despite adequate caloric intake on calorie count - Pre-albumin dropping.  - Dietitian following with supplements adjusted.   8. Microcytic anemia - MCV 79.  - Iron Sats 13%.  - Rec'd Feraheme 11/8 and 11/13   9. Paroxysmal Atrial Fibrillation w/ RVR - new, developed 11/10. Now back in NSR   - Maintaining NSR. Continue amio gtt 30/hr while on milrinone - Eliquis 5 mg bid  - mag and K stable today.  - Keep K > 4.0 and Mg >2.0   10. Hypokalemia - Stable today.   11. Hyponatremia - Na 131>130 today.  - restrict free water  Needs higher dose of IV lasix. Watch closely. He will need RHC later this week. Would like to diurese a little more prior to cath.   Length of Stay: [place], NP  03/27/2021, 7:34 AM  Advanced Heart Failure Team Pager [phone] (M-F; 7a - 5p)  Please contact [facility] for night-coverage after hours (5p -7a ) and weekends on [email or web address]  Patient  seen and examined with the above-signed Advanced Practice Provider and/or Housestaff. I personally reviewed laboratory data, imaging studies and relevant notes. I independently examined the patient and formulated the important aspects of the plan. I have edited the note to reflect any of my changes or salient points. I have personally discussed the plan with the patient and/or family.  Remains on milrinone. CVP 2-3. Still with some SOB. Co-ox marginal. Anxiety a bit better with Klonopin  General: Sitting in chair  No resp difficulty HEENT: normal Neck: supple. no JVD. Carotids 2+ bilat; no bruits. No lymphadenopathy or thryomegaly appreciated. Cor: PMI nondisplaced. Regular rate & rhythm. 2/6 AS Lungs: clear decreased in bases  Abdomen: soft, nontender, nondistended. No hepatosplenomegaly. No bruits or masses. Good bowel sounds. Extremities: no cyanosis, clubbing, rash, edema Neuro: alert & orientedx3, cranial nerves grossly intact. moves all 4 extremities w/o difficulty. Affect pleasant  Remains tenuous. Continue IV milrinone. Plan RHC in am. I d/w Dr. Cyndia Bent today. Possible VAD 11/25. Calorie count underway. Continue to ambulate.   Glori Bickers, MD  6:57 PM

## 2021-03-27 NOTE — Progress Notes (Addendum)
Patient ID: JERMAYNE SWEENEY, male   DOB: Dec 18, 1955, 65 y.o.   MRN: [id number]     Advanced Heart Failure Rounding Note  PCP-Cardiologist: [name], MD   Subjective:    Admitted with sepsis. Started on cefepime + vanc, now off abx (ID following). Afebrile.  10/27 started on ivabradine 7.5 mg twice a day and midodrine 5 mg TID 10/29 Co-ox 48%, started on milrinone 0.25.  10/30 IV Antibiotics stopped. Blood cultures no growth for 5 days.  10/31 started on Augmentin for PNA.  11/1 Milrinone off 11/2 Milrinone added back. Midodrine increased to 10 mg TID 11/8 Placed on 80 mg po lasix  11/10 Developed Afib w/ RVR, converted with IV amio 11/13 Give 250 cc NS bolus for low CVP.  11/14 Milrinone increased to 0.25 mcg. Diuresed with IV lasix .    Yesterday diuresed with 40 mg IV lasix. Sluggish diuresis.   Milrinone 0.25 mcg. CO-OX 53%.   Pre-albumin 15 -> 13.     Unable to sleep in the bed. Denies SOB.     Objective:   Weight Range: 78.7 kg Body mass index is 23.53 kg/m.   Vital Signs:   Temp:  [97.6 F (36.4 C)-98.1 F (36.7 C)] 97.6 F (36.4 C) (11/15 0400) Pulse Rate:  [101-112] 101 (11/15 0400) Resp:  [24-27] 24 (11/15 0400) BP: (91-104)/(62-64) 92/64 (11/15 0400) SpO2:  [88 %-95 %] 91 % (11/15 0400) Weight:  [78.7 kg] 78.7 kg (11/15 0500) Last BM Date: 03/26/21  Weight change: Filed Weights   03/25/21 0430 03/26/21 0550 03/27/21 0500  Weight: 76.5 kg 77.7 kg 78.7 kg    Intake/Output:   Intake/Output Summary (Last 24 hours) at 03/27/2021 0734 Last data filed at 03/27/2021 0600 Gross per 24 hour  Intake 700.87 ml  Output 950 ml  Net -249.13 ml     Physical Exam  CVP 2-3 check in the chair.  General:  Sitting in the chair. No resp difficulty HEENT: normal Neck: supple. JVP 6-7 . Carotids 2+ bilat; no bruits. No lymphadenopathy or thryomegaly appreciated. Cor: PMI nondisplaced. Regular rate & rhythm. No rubs, gallops or murmurs. Lungs: Crackles 1/2  way up.  Abdomen: soft, nontender, nondistended. No hepatosplenomegaly. No bruits or masses. Good bowel sounds. Extremities: no cyanosis, clubbing, rash, R and LLE 1+ edema. LUE PICC  Neuro: alert & orientedx3, cranial nerves grossly intact. moves all 4 extremities w/o difficulty. Affect pleasant]  Telemetry  ST 100s personally reviewed.    Labs    CBC Recent Labs    03/26/21 0435 03/27/21 0400  WBC 9.4 10.6*  HGB 8.8* 8.4*  HCT 28.9* 27.6*  MCV 79.8* 79.8*  PLT 325 382   Basic Metabolic Panel Recent Labs    03/26/21 0435 03/27/21 0400  NA 131* 130*  K 3.8 4.1  CL 92* 91*  CO2 31 31  GLUCOSE 159* 175*  BUN 23 24*  CREATININE 0.68 0.60*  CALCIUM 8.9 8.9  MG  --  2.1   Liver Function Tests No results for input(s): AST, ALT, ALKPHOS, BILITOT, PROT, ALBUMIN in the last 72 hours.  No results for input(s): LIPASE, AMYLASE in the last 72 hours. Cardiac Enzymes No results for input(s): CKTOTAL, CKMB, CKMBINDEX, TROPONINI in the last 72 hours.  BNP: BNP (last 3 results) Recent Labs    11/30/20 0144 01/08/21 0731 01/18/21 1629  BNP 2,653.1* 734.2* 607.9*    ProBNP (last 3 results) No results for input(s): PROBNP in the last 8760 hours.  D-Dimer No results for input(s): DDIMER in the last 72 hours. Hemoglobin A1C No results for input(s): HGBA1C in the last 72 hours. Fasting Lipid Panel No results for input(s): CHOL, HDL, LDLCALC, TRIG, CHOLHDL, LDLDIRECT in the last 72 hours. Thyroid Function Tests No results for input(s): TSH, T4TOTAL, T3FREE, THYROIDAB in the last 72 hours.  Invalid input(s): FREET3  Other results:   Imaging    No results found.   Medications:     Scheduled Medications:  (feeding supplement) PROSource Plus  30 mL Oral QID   apixaban  5 mg Oral BID   atorvastatin  80 mg Oral QPM   Chlorhexidine Gluconate Cloth  6 each Topical Daily   clonazepam  0.25 mg Oral BID   digoxin  0.125 mg Oral Daily   diphenhydrAMINE  50 mg  Oral QHS   ezetimibe  10 mg Oral Daily   feeding supplement  237 mL Oral TID BM   furosemide  40 mg Intravenous Daily   gabapentin  200 mg Oral TID   lidocaine  1 patch Transdermal Q24H   melatonin  5 mg Oral QHS   midodrine  10 mg Oral TID WC   multivitamin with minerals  1 tablet Oral Daily   pantoprazole  40 mg Oral Daily   polyethylene glycol  17 g Oral Daily   potassium chloride  40 mEq Oral BID   senna  2 tablet Oral BID   sertraline  100 mg Oral Daily   sodium chloride flush  10-40 mL Intracatheter Q12H   tamsulosin  0.4 mg Oral Daily    Infusions:  amiodarone 30 mg/hr (03/27/21 0300)   milrinone 0.25 mcg/kg/min (03/27/21 0300)     PRN Medications: acetaminophen **OR** acetaminophen, docusate sodium, guaiFENesin, ondansetron **OR** ondansetron (ZOFRAN) IV, sodium chloride flush    Patient Profile   Mr Bradish is a 65 year old with history of smoker, CAD s/p previous MI, HTN, HL, chronic HFeEF, sepsis 11/2020 bacteremia.  Admitted with sepsis. Bld Cx - NGTD    Assessment/Plan    1. Acute on chronic systolic HFr EF - Echo 01/16/27 EF 20-25% RV mildly HK. moderate AS  Mean gradient 13 AVA 1.2 cm2 DI 0.30 - RHC (7/22): EF 20%, low filling pressures. CI 2.3.  - NYHA IV at baseline - 11/2 Restarted 0.125 milrinone  with increase to 0.25 mcg on 11/14  - Co-ox 54% on milrinone 0.25 mcg.  - CVP 2-3 but on exam he has crackles and low extremity edema.  - Increase lasix to 60 mg twice a d ay.  - No bb with low output HF - No ivabradine given AF   - Continue digoxin. Dig level ok - Continue midodrine 10 mg TID   - Discussed with [name] -  not a candidate for transplant.  - VAD w/u underway. Will try to support in house with milrinone, PT/OT and adequate nutrition.  - Initially planned for possible VAD in early December, date moved up to 11/25 -Dietitian appreciated.   2. Sepsis - H/o strep bacteremia 11/2020.  ICD extracted 12/04/20. Barostim was not removed as it is  extravascular.  - Received IV antibiotics until 01/15/21. Blood cultures negative 01/30/21..  - Procalcitonin 0.22->0.11 - 10/26 -Blood CX x2  No growth 5 days.  - Ct chest concerning for pneumonia. Treated w/ cefepime/vancomycin -> Augmentin (stop 11/7) - Afebrile.   3. Acute Hypoxic Respiratory Failure  - CXR with pulmonary edema initially, and was diuresed with IV Lasix.  -  CT chest completed 10/30 concerning for bronchopneumonia. On augmentin.  - Sats stable on 4 liters [place].    4. CAD  s/p anterior STEMI (12/20). LHC showed chronically occluded RCA (with L>>R collaterals) and thrombotic occlusion of proximal LAD. Underwent PCI of LAD. - LHC (7/22) with stable CAD.  - No s/s angina - Continue atorvastatin 80. No aspirin d/t anticoagulation.  5. 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 H./O RUE DVT - On eliquis. Will need to change to heparin as we get closer to VAD  7. Severe protein-calorie malnutrition - prealbumin 11>15> 13 despite adequate caloric intake on calorie count - Pre-albumin dropping.  - Dietitian following with supplements adjusted.   8. Microcytic anemia - MCV 79.  - Iron Sats 13%.  - Rec'd Feraheme 11/8 and 11/13   9. Paroxysmal Atrial Fibrillation w/ RVR - new, developed 11/10. Now back in NSR   - Maintaining NSR. Continue amio gtt 30/hr while on milrinone - Eliquis 5 mg bid  - mag and K stable today.  - Keep K > 4.0 and Mg >2.0   10. Hypokalemia - Stable today.   11. Hyponatremia - Na 131>130 today.  - restrict free water  Needs higher dose of IV lasix. Watch closely. He will need RHC later this week. Would like to diurese a little more prior to cath.   Length of Stay: [place], NP  03/27/2021, 7:34 AM  Advanced Heart Failure Team Pager [phone] (M-F; 7a - 5p)  Please contact [facility] for night-coverage after hours (5p -7a ) and weekends on [email or web address]  Patient  seen and examined with the above-signed Advanced Practice Provider and/or Housestaff. I personally reviewed laboratory data, imaging studies and relevant notes. I independently examined the patient and formulated the important aspects of the plan. I have edited the note to reflect any of my changes or salient points. I have personally discussed the plan with the patient and/or family.  Remains on milrinone. CVP 2-3. Still with some SOB. Co-ox marginal. Anxiety a bit better with Klonopin  General: Sitting in chair  No resp difficulty HEENT: normal Neck: supple. no JVD. Carotids 2+ bilat; no bruits. No lymphadenopathy or thryomegaly appreciated. Cor: PMI nondisplaced. Regular rate & rhythm. 2/6 AS Lungs: clear decreased in bases  Abdomen: soft, nontender, nondistended. No hepatosplenomegaly. No bruits or masses. Good bowel sounds. Extremities: no cyanosis, clubbing, rash, edema Neuro: alert & orientedx3, cranial nerves grossly intact. moves all 4 extremities w/o difficulty. Affect pleasant  Remains tenuous. Continue IV milrinone. Plan RHC in am. I d/w Dr. Cyndia Bent today. Possible VAD 11/25. Calorie count underway. Continue to ambulate.   Glori Bickers, MD  6:57 PM

## 2021-03-27 NOTE — Progress Notes (Signed)
CARDIAC REHAB PHASE I   PRE:  Rate/Rhythm: 92 SR  BP:  Sitting: 91/66      SaO2: 91 3L  MODE:  Ambulation: wheeled outside  POST:  Rate/Rhythm: 104 ST  BP:  Sitting: 92/65    SaO2: 92 3L   Pt eager to go outside. Pt assisted to wheelchair and wheeled outside for some fresh air. Pt very appreciative. Pt returned to recliner. Lunch set up. Encouraged continued ambulation.  [id number] Rufina Falco, RN BSN 03/27/2021 12:25 PM

## 2021-03-28 ENCOUNTER — Encounter (HOSPITAL_COMMUNITY): Payer: Self-pay | Admitting: Internal Medicine

## 2021-03-28 ENCOUNTER — Encounter (HOSPITAL_COMMUNITY)
Admission: EM | Disposition: A | Discharge status: Home Health | Payer: Self-pay | Source: Home / Self Care | Attending: Surgery

## 2021-03-28 DIAGNOSIS — J9621 Acute and chronic respiratory failure with hypoxia: Secondary | ICD-10-CM | POA: Diagnosis not present

## 2021-03-28 DIAGNOSIS — I5023 Acute on chronic systolic (congestive) heart failure: Secondary | ICD-10-CM | POA: Diagnosis not present

## 2021-03-28 HISTORY — PX: RIGHT HEART CATH: CATH118263

## 2021-03-28 LAB — POCT I-STAT EG7
Acid-Base Excess: 8 mmol/L — ABNORMAL HIGH (ref 0.0–2.0)
Acid-Base Excess: 9 mmol/L — ABNORMAL HIGH (ref 0.0–2.0)
Bicarbonate: 33.4 mmol/L — ABNORMAL HIGH (ref 20.0–28.0)
Bicarbonate: 33.8 mmol/L — ABNORMAL HIGH (ref 20.0–28.0)
Calcium, Ion: 1.25 mmol/L (ref 1.15–1.40)
Calcium, Ion: 1.25 mmol/L (ref 1.15–1.40)
HCT: 27 % — ABNORMAL LOW (ref 39.0–52.0)
HCT: 28 % — ABNORMAL LOW (ref 39.0–52.0)
Hemoglobin: 9.2 g/dL — ABNORMAL LOW (ref 13.0–17.0)
Hemoglobin: 9.5 g/dL — ABNORMAL LOW (ref 13.0–17.0)
O2 Saturation: 57 %
O2 Saturation: 57 %
Potassium: 3.9 mmol/L (ref 3.5–5.1)
Potassium: 4 mmol/L (ref 3.5–5.1)
Sodium: 134 mmol/L — ABNORMAL LOW (ref 135–145)
Sodium: 135 mmol/L (ref 135–145)
TCO2: 35 mmol/L — ABNORMAL HIGH (ref 22–32)
TCO2: 35 mmol/L — ABNORMAL HIGH (ref 22–32)
pCO2, Ven: 47.8 mmHg (ref 44.0–60.0)
pCO2, Ven: 48.7 mmHg (ref 44.0–60.0)
pH, Ven: 7.444 — ABNORMAL HIGH (ref 7.250–7.430)
pH, Ven: 7.458 — ABNORMAL HIGH (ref 7.250–7.430)
pO2, Ven: 29 mmHg — CL (ref 32.0–45.0)
pO2, Ven: 29 mmHg — CL (ref 32.0–45.0)

## 2021-03-28 LAB — BASIC METABOLIC PANEL
Anion gap: 8 (ref 5–15)
BUN: 26 mg/dL — ABNORMAL HIGH (ref 8–23)
CO2: 31 mmol/L (ref 22–32)
Calcium: 9 mg/dL (ref 8.9–10.3)
Chloride: 93 mmol/L — ABNORMAL LOW (ref 98–111)
Creatinine, Ser: 0.65 mg/dL (ref 0.61–1.24)
GFR, Estimated: >60 mL/min (ref >60)
Glucose, Bld: 118 mg/dL — ABNORMAL HIGH (ref 70–99)
Potassium: 4.1 mmol/L (ref 3.5–5.1)
Sodium: 132 mmol/L — ABNORMAL LOW (ref 135–145)

## 2021-03-28 LAB — CBC
HCT: 26.9 % — ABNORMAL LOW (ref 39.0–52.0)
Hemoglobin: 8.4 g/dL — ABNORMAL LOW (ref 13.0–17.0)
MCH: 24.9 pg — ABNORMAL LOW (ref 26.0–34.0)
MCHC: 31.2 g/dL (ref 30.0–36.0)
MCV: 79.6 fL — ABNORMAL LOW (ref 80.0–100.0)
Platelets: 343 10*3/uL (ref 150–400)
RBC: 3.38 MIL/uL — ABNORMAL LOW (ref 4.22–5.81)
RDW: 19.2 % — ABNORMAL HIGH (ref 11.5–15.5)
WBC: 12 10*3/uL — ABNORMAL HIGH (ref 4.0–10.5)
nRBC: 0 % (ref 0.0–0.2)

## 2021-03-28 LAB — MAGNESIUM: Magnesium: 1.9 mg/dL (ref 1.7–2.4)

## 2021-03-28 LAB — COOXEMETRY PANEL
Carboxyhemoglobin: 1.9 % — ABNORMAL HIGH (ref 0.5–1.5)
Methemoglobin: 0.8 % (ref 0.0–1.5)
O2 Saturation: 53.4 %
Total hemoglobin: 8.8 g/dL — ABNORMAL LOW (ref 12.0–16.0)

## 2021-03-28 SURGERY — RIGHT HEART CATH
Anesthesia: LOCAL

## 2021-03-28 MED ORDER — SODIUM CHLORIDE 0.9 % IV SOLN: INTRAVENOUS | Status: DC

## 2021-03-28 MED ORDER — FENTANYL CITRATE (PF) 100 MCG/2ML IJ SOLN
INTRAMUSCULAR | Status: AC
  Filled 2021-03-28: qty 2

## 2021-03-28 MED ORDER — HYDRALAZINE HCL 20 MG/ML IJ SOLN: 10.0000 mg | INTRAMUSCULAR | Status: AC | PRN

## 2021-03-28 MED ORDER — LIDOCAINE HCL (PF) 1 % IJ SOLN
INTRAMUSCULAR | Status: DC | PRN
  Administered 2021-03-28: 2 mL

## 2021-03-28 MED ORDER — SODIUM CHLORIDE 0.9 % IV SOLN: 250.0000 mL | INTRAVENOUS | Status: DC | PRN

## 2021-03-28 MED ORDER — ASPIRIN 81 MG PO CHEW: 81.0000 mg | CHEWABLE_TABLET | ORAL | Status: DC

## 2021-03-28 MED ORDER — HEPARIN (PORCINE) IN NACL 1000-0.9 UT/500ML-% IV SOLN
INTRAVENOUS | Status: DC | PRN
  Administered 2021-03-28: 500 mL

## 2021-03-28 MED ORDER — HEPARIN (PORCINE) IN NACL 1000-0.9 UT/500ML-% IV SOLN
INTRAVENOUS | Status: AC
  Filled 2021-03-28: qty 500

## 2021-03-28 MED ORDER — SODIUM CHLORIDE 0.9% FLUSH: 3.0000 mL | INTRAVENOUS | Status: DC | PRN

## 2021-03-28 MED ORDER — MIDAZOLAM HCL 2 MG/2ML IJ SOLN
INTRAMUSCULAR | Status: AC
  Filled 2021-03-28: qty 2

## 2021-03-28 MED ORDER — ASPIRIN 81 MG PO CHEW
81.0000 mg | CHEWABLE_TABLET | ORAL | Status: AC
  Administered 2021-03-28: 81 mg via ORAL
  Filled 2021-03-28: qty 1

## 2021-03-28 MED ORDER — APIXABAN 5 MG PO TABS
5.0000 mg | ORAL_TABLET | Freq: Two times a day (BID) | ORAL | Status: DC
  Administered 2021-03-28 – 2021-04-01 (×9): 5 mg via ORAL
  Filled 2021-03-28 (×9): qty 1

## 2021-03-28 MED ORDER — ONDANSETRON HCL 4 MG/2ML IJ SOLN: 4.0000 mg | Freq: Four times a day (QID) | INTRAMUSCULAR | Status: DC | PRN

## 2021-03-28 MED ORDER — LABETALOL HCL 5 MG/ML IV SOLN: 10.0000 mg | INTRAVENOUS | Status: AC | PRN

## 2021-03-28 MED ORDER — ACETAMINOPHEN 325 MG PO TABS: 650.0000 mg | ORAL_TABLET | ORAL | Status: DC | PRN

## 2021-03-28 MED ORDER — SODIUM CHLORIDE 0.9% FLUSH
3.0000 mL | Freq: Two times a day (BID) | INTRAVENOUS | Status: DC
  Administered 2021-03-28 – 2021-04-05 (×5): 3 mL via INTRAVENOUS

## 2021-03-28 MED ORDER — LIDOCAINE HCL (PF) 1 % IJ SOLN
INTRAMUSCULAR | Status: AC
  Filled 2021-03-28: qty 30

## 2021-03-28 SURGICAL SUPPLY — 5 items
CATH BALLN WEDGE 5F 110CM (CATHETERS) ×3 IMPLANT
GUIDEWIRE .025 260CM (WIRE) ×6 IMPLANT
PACK CARDIAC CATHETERIZATION (CUSTOM PROCEDURE TRAY) ×3 IMPLANT
SHEATH GLIDE SLENDER 4/5FR (SHEATH) ×3 IMPLANT
TRANSDUCER W/STOPCOCK (MISCELLANEOUS) ×3 IMPLANT

## 2021-03-28 NOTE — Progress Notes (Signed)
Patient ID: Brandon [name], male   DOB: 1955-07-06, 65 y.o.   MRN: [id number]     Advanced Heart Failure Rounding Note  PCP-Cardiologist: [name], MD   Subjective:    Admitted with sepsis. Started on cefepime + vanc, now off abx (ID following). Afebrile.  10/27 started on ivabradine 7.5 mg twice a day and midodrine 5 mg TID 10/29 Co-ox 48%, started on milrinone 0.25.  10/30 IV Antibiotics stopped. Blood cultures no growth for 5 days.  10/31 started on Augmentin for PNA.  11/1 Milrinone off 11/2 Milrinone added back. Midodrine increased to 10 mg TID 11/8 Placed on 80 mg po lasix  11/10 Developed Afib w/ RVR, converted with IV amio 11/13 Give 250 cc NS bolus for low CVP.  11/14 Milrinone increased to 0.25 mcg. Diuresed with IV lasix .    Remains on milrinone 0.25. Still SOB and orthopneic at times. Co-ox 53%    Objective:   Weight Range: 78.7 kg Body mass index is 23.53 kg/m.   Vital Signs:   Temp:  [97.4 F (36.3 C)-98.5 F (36.9 C)] 97.4 F (36.3 C) (11/16 0736) Pulse Rate:  [99-110] 107 (11/16 0736) Resp:  [20-28] 20 (11/16 0736) BP: (91-103)/(60-74) 92/60 (11/16 0736) SpO2:  [90 %-97 %] 93 % (11/16 0844) Weight:  [78.7 kg] 78.7 kg (11/16 0445) Last BM Date: 03/27/21  Weight change: Filed Weights   03/26/21 0550 03/27/21 0500 03/28/21 0445  Weight: 77.7 kg 78.7 kg 78.7 kg    Intake/Output:   Intake/Output Summary (Last 24 hours) at 03/28/2021 0857 Last data filed at 03/28/2021 0400 Gross per 24 hour  Intake 1828.12 ml  Output 3325 ml  Net -1496.88 ml      Physical Exam  General: Sitting in chair No resp difficulty HEENT: normal Neck: supple. no JVD. Carotids 2+ bilat; no bruits. No lymphadenopathy or thryomegaly appreciated. Cor: PMI nondisplaced. Regular rate & rhythm.  2/6 AG Lungs: clear Abdomen: soft, nontender, nondistended. No hepatosplenomegaly. No bruits or masses. Good bowel sounds. Extremities: no cyanosis, clubbing, rash,  edema Neuro: alert & orientedx3, cranial nerves grossly intact. moves all 4 extremities w/o difficulty. Affect pleasant   Telemetry   ST 100-110 Personally reviewed    Labs    CBC Recent Labs    03/27/21 0400 03/28/21 0335  WBC 10.6* 12.0*  HGB 8.4* 8.4*  HCT 27.6* 26.9*  MCV 79.8* 79.6*  PLT 318 443    Basic Metabolic Panel Recent Labs    03/27/21 0400 03/28/21 0335  NA 130* 132*  K 4.1 4.1  CL 91* 93*  CO2 31 31  GLUCOSE 175* 118*  BUN 24* 26*  CREATININE 0.60* 0.65  CALCIUM 8.9 9.0  MG 2.1 1.9    Liver Function Tests No results for input(s): AST, ALT, ALKPHOS, BILITOT, PROT, ALBUMIN in the last 72 hours.  No results for input(s): LIPASE, AMYLASE in the last 72 hours. Cardiac Enzymes No results for input(s): CKTOTAL, CKMB, CKMBINDEX, TROPONINI in the last 72 hours.  BNP: BNP (last 3 results) Recent Labs    11/30/20 0144 01/08/21 0731 01/18/21 1629  BNP 2,653.1* 734.2* 607.9*     ProBNP (last 3 results) No results for input(s): PROBNP in the last 8760 hours.    D-Dimer No results for input(s): DDIMER in the last 72 hours. Hemoglobin A1C No results for input(s): HGBA1C in the last 72 hours. Fasting Lipid Panel No results for input(s): CHOL, HDL, LDLCALC, TRIG, CHOLHDL, LDLDIRECT in the last 72 hours. Thyroid  Function Tests No results for input(s): TSH, T4TOTAL, T3FREE, THYROIDAB in the last 72 hours.  Invalid input(s): FREET3  Other results:   Imaging    No results found.   Medications:     Scheduled Medications:  [MAR Hold] (feeding supplement) PROSource Plus  30 mL Oral QID   [MAR Hold] atorvastatin  80 mg Oral QPM   [MAR Hold] Chlorhexidine Gluconate Cloth  6 each Topical Daily   [MAR Hold] clonazepam  0.25 mg Oral BID   [MAR Hold] digoxin  0.125 mg Oral Daily   [MAR Hold] diphenhydrAMINE  50 mg Oral QHS   [MAR Hold] ezetimibe  10 mg Oral Daily   [MAR Hold] feeding supplement  237 mL Oral TID BM   [MAR Hold] furosemide   60 mg Intravenous BID   [MAR Hold] gabapentin  200 mg Oral TID   [MAR Hold] lidocaine  1 patch Transdermal Q24H   [MAR Hold] melatonin  5 mg Oral QHS   [MAR Hold] midodrine  10 mg Oral TID WC   [MAR Hold] multivitamin with minerals  1 tablet Oral Daily   [MAR Hold] pantoprazole  40 mg Oral Daily   [MAR Hold] polyethylene glycol  17 g Oral Daily   [MAR Hold] potassium chloride  40 mEq Oral BID   [MAR Hold] senna  2 tablet Oral BID   [MAR Hold] sertraline  100 mg Oral Daily   [MAR Hold] sodium chloride flush  10-40 mL Intracatheter Q12H   [MAR Hold] sodium chloride flush  3 mL Intravenous Q12H   [MAR Hold] tamsulosin  0.4 mg Oral Daily    Infusions:  sodium chloride     sodium chloride 10 mL/hr at 03/28/21 0720   amiodarone 30 mg/hr (03/28/21 0000)   milrinone 0.25 mcg/kg/min (03/28/21 0814)     PRN Medications: sodium chloride, [MAR Hold] acetaminophen **OR** [MAR Hold] acetaminophen, [MAR Hold] docusate sodium, [MAR Hold] guaiFENesin, Heparin (Porcine) in NaCl, [MAR Hold] ondansetron **OR** [MAR Hold] ondansetron (ZOFRAN) IV, [MAR Hold] sodium chloride flush, sodium chloride flush    Patient Profile   Brandon [name] is a 65 year old with history of smoker, CAD s/p previous MI, HTN, HL, chronic HFeEF, sepsis 11/2020 bacteremia.  Admitted with sepsis. Bld Cx - NGTD    Assessment/Plan    1. Acute on chronic systolic HFr EF - Echo 06/16/51 EF 20-25% RV mildly HK. moderate AS  Mean gradient 13 AVA 1.2 cm2 DI 0.30 - RHC (7/22): EF 20%, low filling pressures. CI 2.3.  - NYHA IV at baseline - 11/2 Restarted 0.125 milrinone  with increase to 0.25 mcg on 11/14  - Co-ox 53% on milrinone 0.25 mcg.  - CVP low but remains symptomatic - No bb with low output HF - No ivabradine given AF   - Continue digoxin. Dig level ok - Continue midodrine 10 mg TID   - Discussed with [name] -  not a candidate for transplant.  - VAD w/u underway. Will try to support in house with milrinone, PT/OT and  adequate nutrition.  - Initially planned for possible VAD in early December, date moved up to 11/25 - d/w Dr. Cyndia Bent -Dietitian appreciated.  - Plan RHC today to further evaluate hemodynamics  2. Sepsis - H/o strep bacteremia 11/2020.  ICD extracted 12/04/20. Barostim was not removed as it is extravascular.  - Received IV antibiotics until 01/15/21. Blood cultures negative 01/30/21..  - Procalcitonin 0.22->0.11 - 10/26 -Blood CX x2  No growth 5 days.  - Ct  chest concerning for pneumonia. Treated w/ cefepime/vancomycin -> Augmentin (stop 11/7) - Afebrile  3. Acute Hypoxic Respiratory Failure  - CXR with pulmonary edema initially, and was diuresed with IV Lasix.  -  CT chest completed 10/30 concerning for bronchopneumonia. On augmentin.  - Sats stable on 4 liters [place].    4. CAD  s/p anterior STEMI (12/20). LHC showed chronically occluded RCA (with L>>R collaterals) and thrombotic occlusion of proximal LAD. Underwent PCI of LAD. - LHC (7/22) with stable CAD.  - No s/s angina - Continue atorvastatin 80. No aspirin d/t anticoagulation.  5. 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 H./O RUE DVT - On eliquis. Will need to change to heparin as we get closer to VAD  7. Severe protein-calorie malnutrition - prealbumin 11>15> 13 despite adequate caloric intake on calorie count - Pre-albumin dropping.  - Dietitian following with supplements adjusted.  - repeat calorie count underway  8. Microcytic anemia - MCV 79.  - Iron Sats 13%.  - Rec'd Feraheme 11/8 and 11/13   9. Paroxysmal Atrial Fibrillation w/ RVR - new, developed 11/10. Now back in NSR   - Maintaining NSR. Continue amio gtt 30/hr while on milrinone - Eliquis 5 mg bid  - mag and K stable today.  - Keep K > 4.0 and Mg >2.0   10. Hypokalemia - Stable today.   11. Hyponatremia - Na 132 today.  - restrict free water  Length of Stay: [place],  MD  03/28/2021, 8:57 AM  Advanced Heart Failure Team Pager [phone] (M-F; 7a - 5p)  Please contact [facility] for night-coverage after hours (5p -7a ) and weekends on [email or web address]

## 2021-03-28 NOTE — Progress Notes (Signed)
Calorie Count Note: Day 2 Results   48-hour calorie count ordered. Calorie count started (03/26/21) at 1100 with lunch meal.   Diet: regular with 2000 ml fluid restriction Supplements:  - Ensure Enlive TID - ProSource Plus QID   Day 1: 11/15 Lunch: 512 kcal, 14 grams of protein 11/15 Dinner: 492 kcal, 22 grams of protein 11/16 Breakfast: NPO Supplements: 1100 kcal, 100 grams of protein   Day 2 total 24-hour intake: 2104 kcal (91% of minimum estimated needs)  136 grams of protein (>100% of minimum estimated needs)   Nutrition Diagnosis: Severe Malnutrition related to chronic illness (CHF) as evidenced by severe muscle depletion, severe fat depletion.   Goal: Patient will meet greater than or equal to 90% of their needs.   Intervention:  - Discontinue 48-hour calorie count - Continue Ensure Enlive TID - Continue ProSource Plus QID - Continue MVI with minerals daily - Continue liberalized Regular diet  Clayborne Dana, RDN, LDN Clinical Nutrition

## 2021-03-28 NOTE — Interval H&P Note (Signed)
History and Physical Interval Note:  03/28/2021 8:52 AM  Brandon [name]  has presented today for surgery, with the diagnosis of heart failure.  The various methods of treatment have been discussed with the patient and family. After consideration of risks, benefits and other options for treatment, the patient has consented to  Procedure(s): RIGHT HEART CATH (N/A) as a surgical intervention.  The patient's history has been reviewed, patient examined, no change in status, stable for surgery.  I have reviewed the patient's chart and labs.  Questions were answered to the patient's satisfaction.     [name]

## 2021-03-28 NOTE — Progress Notes (Signed)
Mobility Specialist Progress Note    03/28/21 1415  Mobility  Activity Ambulated in hall  Level of Assistance Modified independent, requires aide device or extra time  Assistive Device  (IV pole)  Distance Ambulated (ft) 410 ft  Mobility Ambulated independently in hallway  Mobility Response Tolerated fair  Mobility performed by Mobility specialist  Bed Position Chair  $Mobility charge 1 Mobility   Pt received and agreeable. C/o feeling "just out of it" and tired. SpO2 dropped as low as 67 during but did not report any dizziness/lightheadedness. Returned to chair with call bell in reach.   Hildred Alamin Mobility Specialist  Mobility Specialist Phone: [phone]

## 2021-03-28 NOTE — Progress Notes (Signed)
PROGRESS NOTE    Brandon [name]  CHY:[id number] DOB: 06-06-1955 DOA: 03/07/2021 PCP: [name], MD   Brief Narrative: 65 year old with past medical history significant for CAD, chronic systolic heart failure with AICD extracted 7/20-second/2022 due to infection, completed antibiotics on 01/15/2021 with Rocephin, he has moderate aortic stenosis, right upper extremity DVT presents to the ED with acute onset of dyspnea.  He was found to be hypoxic placed on 2 L of oxygen found to be in acute decompensated heart failure.  Cardiology consulted.  Significant events: 10/29 Co-ox 48%, started on milrinone 0.25.  10/30 IV Antibiotics stopped. Blood cultures no growth for 5 days.  10/31 started on augmentin. Milrinone cut back to 0.125 mcg.  03/12/2021 started on midodrine  03/24/2021 started back on milrinone   Assessment & Plan:   Principal Problem:   Acute on chronic respiratory failure with hypoxia (HCC) Active Problems:   Hyperlipidemia with target LDL less than 70   Essential hypertension   CAD (coronary artery disease)   Ischemic cardiomyopathy   Centrilobular emphysema (HCC)   Normocytic anemia   Respiratory failure (HCC)   Bacteremia   Systemic inflammatory response syndrome (SIRS) (HCC)   Chronic systolic CHF (congestive heart failure) (HCC)   Ankylosing spondylitis (HCC)   Leukocytosis   CAP (community acquired pneumonia)   ILD (interstitial lung disease) ([place])   Aspiration pneumonia of both upper lobes (HCC)   Protein-calorie malnutrition, severe  1-Pneumonia/sepsis: Sepsis present on admission, ruled in Patient was initially started on vancomycin and cefepime which was stopped on 10/30. High-resolution CT scan of the chest showed bronchopneumonia, ID started him back on Augmentin to complete 7 more days. He completed Augmentin on 11/7 Monitor white blood cell  2-Acute respiratory failure with hypoxemia due to acute on chronic systolic heart failure: Last echo  July 2022 ejection fraction 20% Continue with  ivabradine, digoxin midodrine and milrinone. Heart failure team following and managing Underwent cath 11/16, Milrirone increase  to 0.375  and plan to continue with IV [place] Not a transplant candidate per [name]. Potential date for VAD is 11/25.  Dyslipidemia;  -Continue with atorvastatin  Right upper extremity DVT with neuropathy: Continue with apixaban and gabapentin Will need to be transition to heparin close to date for surgery   Depression: Continue with sertraline  BPH: Continue with Flomax Protein caloric malnutrition: Continue with Ensure, calorie count.  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   Mild hyponatremia in the setting of heart failure  A. fib with RVR: He was a started on amiodarone infusion. With apixaban  Hypokalemia: Replaced  Anemia: Iron deficiency: Received IV iron x2 Hemoglobin stable 8.4 Plan to check B12 level     Nutrition Problem: Severe Malnutrition Etiology: chronic illness (CHF)    Signs/Symptoms: severe muscle depletion, severe fat depletion    Interventions: MVI, Prostat, Boost Breeze  Estimated body mass index is 23.53 kg/m as calculated from the following:   Height as of this encounter: 6' (1.829 m).   Weight as of this encounter: 78.7 kg.   DVT prophylaxis: Eliquis Code Status: Full code Family Communication: sister who was at bedside Disposition Plan:  Status is: Inpatient  Remains inpatient appropriate because: Admitted with heart failure exacerbation, pneumonia.        Consultants:  Heart failure team  Procedures:  Right side cardiac cath 11/16  Antimicrobials:    Subjective: He appears tired, he denies  worsening shortness of breath, he is feeling okay   Objective: Vitals:   03/27/21 1943 03/27/21 2223 03/28/21 0314 03/28/21 0445  BP: 94/68 98/66 103/74   Pulse: (!)  107 (!) 110 99   Resp: (!) 21 (!) 22 (!) 24   Temp: 97.6 F (36.4 C) 98 F (36.7 C) 98.5 F (36.9 C)   TempSrc: Oral Oral Oral   SpO2: 93% 97% 90%   Weight:    78.7 kg  Height:        Intake/Output Summary (Last 24 hours) at 03/28/2021 0738 Last data filed at 03/28/2021 0400 Gross per 24 hour  Intake 1828.12 ml  Output 3325 ml  Net -1496.88 ml   Filed Weights   03/26/21 0550 03/27/21 0500 03/28/21 0445  Weight: 77.7 kg 78.7 kg 78.7 kg    Examination:  General exam: Appears calm and comfortable  Respiratory system: Bilateral crackles or rhonchorous Cardiovascular system: S1 & S2 heard, RRR.  Stent heart sounds Gastrointestinal system: Abdomen is nondistended, soft and nontender. No organomegaly or masses felt. Normal bowel sounds heard. Central nervous system: Alert and oriented.  Extremities: Symmetric 5 x 5 power.   Data Reviewed: I have personally reviewed following labs and imaging studies  CBC: Recent Labs  Lab 03/24/21 0223 03/25/21 0435 03/26/21 0435 03/27/21 0400 03/28/21 0335  WBC 18.9* 12.8* 9.4 10.6* 12.0*  HGB 9.4* 9.0* 8.8* 8.4* 8.4*  HCT 29.9* 28.8* 28.9* 27.6* 26.9*  MCV 79.3* 79.1* 79.8* 79.8* 79.6*  PLT 366 327 325 318 938   Basic Metabolic Panel: Recent Labs  Lab 03/22/21 0630 03/22/21 1816 03/24/21 0223 03/25/21 0435 03/26/21 0435 03/27/21 0400 03/28/21 0335  NA  --    < > 126* 128* 131* 130* 132*  K  --    < > 3.9 3.5 3.8 4.1 4.1  CL  --    < > 88* 88* 92* 91* 93*  CO2  --    < > 28 29 31 31 31   GLUCOSE  --    < > 226* 225* 159* 175* 118*  BUN  --    < > 24* 22 23 24* 26*  CREATININE  --    < > 0.64 0.64 0.68 0.60* 0.65  CALCIUM  --    < > 8.9 8.5* 8.9 8.9 9.0  MG 1.9  --  2.0  --   --  2.1 1.9   < > = values in this interval not displayed.   GFR: Estimated Creatinine Clearance: 98.3 mL/min (by C-G formula based on SCr of 0.65 mg/dL). Liver Function Tests: No results for input(s): AST, ALT, ALKPHOS, BILITOT, PROT, ALBUMIN in  the last 168 hours. No results for input(s): LIPASE, AMYLASE in the last 168 hours. No results for input(s): AMMONIA in the last 168 hours. Coagulation Profile: No results for input(s): INR, PROTIME in the last 168 hours. Cardiac Enzymes: No results for input(s): CKTOTAL, CKMB, CKMBINDEX, TROPONINI in the last 168 hours. BNP (last 3 results) No results for input(s): PROBNP in the last 8760 hours. HbA1C: No results for input(s): HGBA1C in the last 72 hours. CBG: No results for input(s): GLUCAP in the last 168 hours. Lipid Profile: No results for input(s): CHOL, HDL, LDLCALC, TRIG, CHOLHDL, LDLDIRECT in the last 72 hours. Thyroid Function Tests: No results for input(s): TSH, T4TOTAL, FREET4, T3FREE, THYROIDAB in the last 72 hours. Anemia Panel: No results for input(s): VITAMINB12, FOLATE, FERRITIN, TIBC, IRON, RETICCTPCT in the last 72 hours. Sepsis Labs: No results for  input(s): PROCALCITON, LATICACIDVEN in the last 168 hours.  No results found for this or any previous visit (from the past 240 hour(s)).       Radiology Studies: No results found.      Scheduled Meds:  (feeding supplement) PROSource Plus  30 mL Oral QID   atorvastatin  80 mg Oral QPM   Chlorhexidine Gluconate Cloth  6 each Topical Daily   clonazepam  0.25 mg Oral BID   digoxin  0.125 mg Oral Daily   diphenhydrAMINE  50 mg Oral QHS   ezetimibe  10 mg Oral Daily   feeding supplement  237 mL Oral TID BM   furosemide  60 mg Intravenous BID   gabapentin  200 mg Oral TID   lidocaine  1 patch Transdermal Q24H   melatonin  5 mg Oral QHS   midodrine  10 mg Oral TID WC   multivitamin with minerals  1 tablet Oral Daily   pantoprazole  40 mg Oral Daily   polyethylene glycol  17 g Oral Daily   potassium chloride  40 mEq Oral BID   senna  2 tablet Oral BID   sertraline  100 mg Oral Daily   sodium chloride flush  10-40 mL Intracatheter Q12H   sodium chloride flush  3 mL Intravenous Q12H   tamsulosin  0.4 mg  Oral Daily   Continuous Infusions:  sodium chloride     sodium chloride 10 mL/hr at 03/28/21 0720   amiodarone 30 mg/hr (03/28/21 0000)   milrinone 0.25 mcg/kg/min (03/28/21 0000)     LOS: 20 days    Time spent: 35 Minutes.     Elmarie Shiley, MD [facility]   If 7PM-7AM, please contact night-coverage [email or web address]  03/28/2021, 7:38 AM

## 2021-03-28 NOTE — Progress Notes (Signed)
Mobility Specialist Progress Note    03/28/21 1743  Mobility  Activity Ambulated in hall  Level of Assistance Modified independent, requires aide device or extra time  Assistive Device  (IV pole)  Distance Ambulated (ft) 410 ft  Mobility Ambulated independently in hallway  Mobility Response Tolerated well  Mobility performed by Mobility specialist  Bed Position Chair  $Mobility charge 1 Mobility   Pt received in chair and agreeable. No complaints on walk but was feeling overall a little out of it from the day's events. Took no breaks on walk. Ambulated on 6LO2. Returned to chair with call bell in reach.   Hildred Alamin Mobility Specialist  Mobility Specialist Phone: [phone]

## 2021-03-28 NOTE — Progress Notes (Signed)
CSW met with patient at bedside. CSW introduced self and explained role and that patient has also met Tammy Sours, LCSW who also works with the VAD Team in the [facility]. CSW asked about upcoming surgery and patient's understanding. He was able to verbalize understanding of the VAD surgery and mentioned that he has met a VAD patient. He states that "the guy I met seem to be living a full life which gives me hope". CSW asked how patient handles stress and he noted "I just deal with it...logically.. think it through". He went on to share how he tries to think positive and not focus on the negative. He shared that he has not had a good day in a long time and looking forward to feeling better.  CSW asked what a good day looks like to him and "being able to do what I want to do whenever I want to do it". Patient states he has a good support system and denies any barriers for surgery or recovery.   CSW provided supportive intervention and told patient about the LVAD Support Group. Patient appeared interested in the group and possibly attending next week. CSW continues to follow for VAD implant. [name], [place], [place]

## 2021-03-29 DIAGNOSIS — J9621 Acute and chronic respiratory failure with hypoxia: Secondary | ICD-10-CM | POA: Diagnosis not present

## 2021-03-29 LAB — CBC
HCT: 27.1 % — ABNORMAL LOW (ref 39.0–52.0)
Hemoglobin: 8.1 g/dL — ABNORMAL LOW (ref 13.0–17.0)
MCH: 24.2 pg — ABNORMAL LOW (ref 26.0–34.0)
MCHC: 29.9 g/dL — ABNORMAL LOW (ref 30.0–36.0)
MCV: 80.9 fL (ref 80.0–100.0)
Platelets: 340 10*3/uL (ref 150–400)
RBC: 3.35 MIL/uL — ABNORMAL LOW (ref 4.22–5.81)
RDW: 20 % — ABNORMAL HIGH (ref 11.5–15.5)
WBC: 11.3 10*3/uL — ABNORMAL HIGH (ref 4.0–10.5)
nRBC: 0 % (ref 0.0–0.2)

## 2021-03-29 LAB — COOXEMETRY PANEL
Carboxyhemoglobin: 1.9 % — ABNORMAL HIGH (ref 0.5–1.5)
Methemoglobin: 0.6 % (ref 0.0–1.5)
O2 Saturation: 59.1 %
Total hemoglobin: 6.1 g/dL — CL (ref 12.0–16.0)

## 2021-03-29 LAB — VITAMIN B12: Vitamin B-12: 280 pg/mL (ref 180–914)

## 2021-03-29 LAB — BASIC METABOLIC PANEL
Anion gap: 8 (ref 5–15)
BUN: 26 mg/dL — ABNORMAL HIGH (ref 8–23)
CO2: 33 mmol/L — ABNORMAL HIGH (ref 22–32)
Calcium: 9.1 mg/dL (ref 8.9–10.3)
Chloride: 91 mmol/L — ABNORMAL LOW (ref 98–111)
Creatinine, Ser: 0.73 mg/dL (ref 0.61–1.24)
GFR, Estimated: >60 mL/min (ref >60)
Glucose, Bld: 127 mg/dL — ABNORMAL HIGH (ref 70–99)
Potassium: 3.7 mmol/L (ref 3.5–5.1)
Sodium: 132 mmol/L — ABNORMAL LOW (ref 135–145)

## 2021-03-29 LAB — MAGNESIUM: Magnesium: 2.1 mg/dL (ref 1.7–2.4)

## 2021-03-29 LAB — VITAMIN D 25 HYDROXY (VIT D DEFICIENCY, FRACTURES): Vit D, 25-Hydroxy: 32.8 ng/mL (ref 30–100)

## 2021-03-29 MED ORDER — FOLIC ACID 1 MG PO TABS
1.0000 mg | ORAL_TABLET | Freq: Every day | ORAL | Status: DC
  Administered 2021-03-29 – 2021-05-09 (×41): 1 mg via ORAL
  Filled 2021-03-29 (×41): qty 1

## 2021-03-29 MED ORDER — VITAMIN B-12 100 MCG PO TABS
100.0000 ug | ORAL_TABLET | Freq: Every day | ORAL | Status: DC
  Administered 2021-03-29 – 2021-05-09 (×41): 100 ug via ORAL
  Filled 2021-03-29 (×42): qty 1

## 2021-03-29 MED ORDER — FUROSEMIDE 10 MG/ML IJ SOLN
80.0000 mg | Freq: Two times a day (BID) | INTRAMUSCULAR | Status: DC
  Administered 2021-03-29 – 2021-04-03 (×12): 80 mg via INTRAVENOUS
  Filled 2021-03-29 (×13): qty 8

## 2021-03-29 MED ORDER — POTASSIUM CHLORIDE CRYS ER 20 MEQ PO TBCR
40.0000 meq | EXTENDED_RELEASE_TABLET | Freq: Three times a day (TID) | ORAL | Status: DC
  Administered 2021-03-29 – 2021-04-01 (×10): 40 meq via ORAL
  Filled 2021-03-29 (×10): qty 2

## 2021-03-29 NOTE — Progress Notes (Addendum)
Patient ID: Brandon [name], male   DOB: 08-Mar-1956, 65 y.o.   MRN: [id number]     Advanced Heart Failure Rounding Note  PCP-Cardiologist: Brandon Ruths, MD   Subjective:    Admitted with sepsis. Started on cefepime + vanc, now off abx (ID following). Afebrile.  10/27 started on ivabradine 7.5 mg twice a day and midodrine 5 mg TID 10/29 Co-ox 48%, started on milrinone 0.25.  10/30 IV Antibiotics stopped. Blood cultures no growth for 5 days.  10/31 started on Augmentin for PNA.  11/1 Milrinone off 11/2 Milrinone added back. Midodrine increased to 10 mg TID 11/8 Placed on 80 mg po lasix  11/10 Developed Afib w/ RVR, converted with IV amio 11/13 Give 250 cc NS bolus for low CVP.  11/14 Milrinone increased to 0.25 mcg. Diuresed with IV lasix  11/16 Had RHC with elevated left sided filling pressures PCWP 28, PA sat 57%, and normal cardiac output. Milrinone was increased to 0.375 mcg. Continued to diurese with IV lasix. Negative 2 liters.   Milrinone 0.375 mcg --> CO-OX 59%.  Remains on amio drip 30 mg per hour.  Hgb 9.2>8.1    Able to sleep better last night. Denies SOB.       Objective:   Weight Range: 78 kg Body mass index is 23.32 kg/m.   Vital Signs:   Temp:  [97.4 F (36.3 C)-97.8 F (36.6 C)] 97.8 F (36.6 C) (11/17 0713) Pulse Rate:  [93-187] 93 (11/17 0713) Resp:  [14-24] 20 (11/17 0713) BP: (85-123)/(59-90) 85/59 (11/17 0713) SpO2:  [91 %-98 %] 98 % (11/17 0713) Weight:  [78 kg] 78 kg (11/17 0500) Last BM Date: 03/27/21  Weight change: Filed Weights   03/27/21 0500 03/28/21 0445 03/29/21 0500  Weight: 78.7 kg 78.7 kg 78 kg    Intake/Output:   Intake/Output Summary (Last 24 hours) at 03/29/2021 0719 Last data filed at 03/29/2021 0300 Gross per 24 hour  Intake 1052.87 ml  Output 3100 ml  Net -2047.13 ml     Physical Exam  CVP 6-7 in the chair General:   No resp difficulty HEENT: normal Neck: supple. no JVD. Carotids 2+ bilat; no bruits. No  lymphadenopathy or thryomegaly appreciated. Cor: PMI nondisplaced. Regular rate & rhythm. No rubs, gallops. 2/6 AS. Lungs: Crackles in the bases on 4 liters [place]  Abdomen: soft, nontender, nondistended. No hepatosplenomegaly. No bruits or masses. Good bowel sounds. Extremities: no cyanosis, clubbing, rash, edema. RUE PICC Neuro: alert & orientedx3, cranial nerves grossly intact. moves all 4 extremities w/o difficulty. Affect pleasant  Telemetry   SR-ST 90-100s    Labs    CBC Recent Labs    03/28/21 0335 03/28/21 0909 03/29/21 0600  WBC 12.0*  --  11.3*  HGB 8.4* 9.2*  9.5* 8.1*  HCT 26.9* 27.0*  28.0* 27.1*  MCV 79.6*  --  80.9  PLT 343  --  250   Basic Metabolic Panel Recent Labs    03/28/21 0335 03/28/21 0909 03/29/21 0600  NA 132* 135  134* 132*  K 4.1 3.9  4.0 3.7  CL 93*  --  91*  CO2 31  --  33*  GLUCOSE 118*  --  127*  BUN 26*  --  26*  CREATININE 0.65  --  0.73  CALCIUM 9.0  --  9.1  MG 1.9  --  2.1   Liver Function Tests No results for input(s): AST, ALT, ALKPHOS, BILITOT, PROT, ALBUMIN in the last 72 hours.  No results for  input(s): LIPASE, AMYLASE in the last 72 hours. Cardiac Enzymes No results for input(s): CKTOTAL, CKMB, CKMBINDEX, TROPONINI in the last 72 hours.  BNP: BNP (last 3 results) Recent Labs    11/30/20 0144 01/08/21 0731 01/18/21 1629  BNP 2,653.1* 734.2* 607.9*    ProBNP (last 3 results) No results for input(s): PROBNP in the last 8760 hours.    D-Dimer No results for input(s): DDIMER in the last 72 hours. Hemoglobin A1C No results for input(s): HGBA1C in the last 72 hours. Fasting Lipid Panel No results for input(s): CHOL, HDL, LDLCALC, TRIG, CHOLHDL, LDLDIRECT in the last 72 hours. Thyroid Function Tests No results for input(s): TSH, T4TOTAL, T3FREE, THYROIDAB in the last 72 hours.  Invalid input(s): FREET3  Other results:   Imaging    CARDIAC CATHETERIZATION  Result Date: 03/28/2021 Findings: On  milrinone 0.25 mcg/kg/min RA = 2 RV = 63/2 PA = 65/27 (44) PCW = 28 (v = 35) Fick cardiac output/index = 6.5/3.2 PVR = 2.5 SVR = 960 Ao sat = 93% PA sat = 57%, 57% PAPi = 19 Assessment: 1. Persistently elevated left-sided filling pressures with pulmonary venous HTN and normal cardiac output Plan/Discussion: Continue diuresis. Will try to increase milrinone to 0.375. Brandon Bickers, MD 9:24 AM    Medications:     Scheduled Medications:  (feeding supplement) PROSource Plus  30 mL Oral QID   apixaban  5 mg Oral BID   atorvastatin  80 mg Oral QPM   Chlorhexidine Gluconate Cloth  6 each Topical Daily   clonazepam  0.25 mg Oral BID   digoxin  0.125 mg Oral Daily   diphenhydrAMINE  50 mg Oral QHS   ezetimibe  10 mg Oral Daily   feeding supplement  237 mL Oral TID BM   furosemide  60 mg Intravenous BID   gabapentin  200 mg Oral TID   lidocaine  1 patch Transdermal Q24H   melatonin  5 mg Oral QHS   midodrine  10 mg Oral TID WC   multivitamin with minerals  1 tablet Oral Daily   pantoprazole  40 mg Oral Daily   polyethylene glycol  17 g Oral Daily   potassium chloride  40 mEq Oral BID   senna  2 tablet Oral BID   sertraline  100 mg Oral Daily   sodium chloride flush  10-40 mL Intracatheter Q12H   sodium chloride flush  3 mL Intravenous Q12H   sodium chloride flush  3 mL Intravenous Q12H   tamsulosin  0.4 mg Oral Daily    Infusions:  sodium chloride     amiodarone 30 mg/hr (03/29/21 0200)   milrinone 0.375 mcg/kg/min (03/29/21 0420)     PRN Medications: sodium chloride, acetaminophen **OR** acetaminophen, docusate sodium, guaiFENesin, ondansetron **OR** ondansetron (ZOFRAN) IV, sodium chloride flush, sodium chloride flush    Patient Profile   [name] is a 65 year old with history of smoker, CAD s/p previous MI, HTN, HL, chronic HFeEF, sepsis 11/2020 bacteremia.  Admitted with sepsis. Bld Cx - NGTD    Assessment/Plan    1. Acute on chronic systolic HFr EF - Echo 09/17/82  EF 20-25% RV mildly HK. moderate AS  Mean gradient 13 AVA 1.2 cm2 DI 0.30 - RHC (7/22): EF 20%, low filling pressures. CI 2.3.  - RHC 11/16  RA 2, PA 65/27 (44), PCW 28, SVR 960, CO 6.5,  CI 3.2 , PA 57%. Milrinone was increased to 0.375 mcg.  - Todays CO-OX 59%. Continue milrinone 0.375  mcg. - Increase lasix to 80 mg twice a day. Supp K  -- No bb with low output HF - No ivabradine given AF   - Continue digoxin. Dig level ok - Continue midodrine 10 mg TID   - Discussed with [name] -  not a candidate for transplant.  - VAD w/u underway. Will try to support in house with milrinone, PT/OT and adequate nutrition.  - Initially planned for possible VAD in early December, date moved up to 11/25 - d/w Dr. Cyndia Bent -Dietitian appreciated.   2. Sepsis - H/o strep bacteremia 11/2020.  ICD extracted 12/04/20. Barostim was not removed as it is extravascular.  - Received IV antibiotics until 01/15/21. Blood cultures negative 01/30/21..  - Procalcitonin 0.22->0.11 - 10/26 -Blood CX x2  No growth 5 days.  - Ct chest concerning for pneumonia. Treated w/ cefepime/vancomycin -> Augmentin (stop 11/7) - Afebrile  3. Acute Hypoxic Respiratory Failure  - CXR with pulmonary edema initially, and was diuresed with IV Lasix.  -  CT chest completed 10/30 concerning for bronchopneumonia. On augmentin.  - Sats stable on 4 liters .    4. CAD  s/p anterior STEMI (12/20). LHC showed chronically occluded RCA (with L>>R collaterals) and thrombotic occlusion of proximal LAD. Underwent PCI of LAD. - LHC (7/22) with stable CAD.  - No s/s angina - Continue atorvastatin 80. No aspirin d/t anticoagulation.  5. 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 H./O RUE DVT - On eliquis. Will need to change to heparin as we get closer to VAD  7. Severe protein-calorie malnutrition - prealbumin 11>15> 13 despite adequate caloric intake on calorie count - Pre-albumin  dropping.  - Dietitian following with supplements adjusted.  - repeat calorie count underway  8. Microcytic anemia -Hgb drifting down. No obvious source of bleeding.  - MCV 79.  - Iron Sats 13%.  - Rec'd Feraheme 11/8 and 11/13   9. Paroxysmal Atrial Fibrillation w/ RVR - new, developed 11/10. Now back in NSR   - Maintaining NSR. Continue amio gtt 30/hr while on milrinone - Eliquis 5 mg bid  - Mag stable 2.1 K 3.7 . K supp increased - Keep K > 4.0 and Mg >2.0   10. Hypokalemia - K 3.7  K supp increased   11. Hyponatremia - Sodium 132 today   - restrict free water  Length of Stay: Heard, NP  03/29/2021, 7:19 AM  Advanced Heart Failure Team Pager [phone] (M-F; 7a - 5p)  Please contact [facility] for night-coverage after hours (5p -7a ) and weekends on [email or web address]  Patient seen and examined with the above-signed Advanced Practice Provider and/or Housestaff. I personally reviewed laboratory data, imaging studies and relevant notes. I independently examined the patient and formulated the important aspects of the plan. I have edited the note to reflect any of my changes or salient points. I have personally discussed the plan with the patient and/or family.  RHC yesterday with persistently elevated left-sided filling pressures despite CVP 2. Remains on IV lasix. Good diuresis. Breathing better. No orthopnea or PND last night.   General:  Sitting in chair. No resp difficulty HEENT: normal Neck: supple. no JVD. Carotids 2+ bilat; no bruits. No lymphadenopathy or thryomegaly appreciated. Cor: PMI nondisplaced. Regular rate & rhythm. 2/6 AS. Lungs: clear Abdomen: soft, nontender, nondistended. No hepatosplenomegaly. No bruits or masses. Good bowel sounds. Extremities: no cyanosis, clubbing, rash, edema Neuro:  alert & orientedx3, cranial nerves grossly intact. moves all 4 extremities w/o difficulty. Affect pleasant  Remains tenuous. Continue milrinone gtt and IV  diuresis. Calorie count reviewed with Nutrition team and he is getting adequate intake. Continue to ambulate with mobility specialist. Remains on amio for AF. Now in sinus. Continue Eliquis. Will need to switch to heparin soon as VAD potentially planned for 11/25.    Brandon Bickers, MD  8:17 AM

## 2021-03-29 NOTE — Progress Notes (Signed)
PROGRESS NOTE    Brandon [name]  WJX:[id number] DOB: 04-17-1956 DOA: 03/07/2021 PCP: [name], MD   Brief Narrative: 65 year old with past medical history significant for CAD, chronic systolic heart failure with AICD extracted 7/20-second/2022 due to infection, completed antibiotics on 01/15/2021 with Rocephin, he has moderate aortic stenosis, right upper extremity DVT presents to the ED with acute onset of dyspnea.  He was found to be hypoxic placed on 2 L of oxygen found to be in acute decompensated heart failure.  Cardiology consulted.  Significant events: 10/29 Co-ox 48%, started on milrinone 0.25.  10/30 IV Antibiotics stopped. Blood cultures no growth for 5 days.  10/31 started on augmentin. Milrinone cut back to 0.125 mcg.  03/12/2021 started on midodrine  03/24/2021 started back on milrinone   Assessment & Plan:   Principal Problem:   Acute on chronic respiratory failure with hypoxia (HCC) Active Problems:   Hyperlipidemia with target LDL less than 70   Essential hypertension   CAD (coronary artery disease)   Ischemic cardiomyopathy   Centrilobular emphysema (HCC)   Normocytic anemia   Respiratory failure (HCC)   Bacteremia   Systemic inflammatory response syndrome (SIRS) (HCC)   Chronic systolic CHF (congestive heart failure) (HCC)   Ankylosing spondylitis (HCC)   Leukocytosis   CAP (community acquired pneumonia)   ILD (interstitial lung disease) ([place])   Aspiration pneumonia of both upper lobes (HCC)   Protein-calorie malnutrition, severe  1-Pneumonia/sepsis: Sepsis present on admission, ruled in Patient was initially started on vancomycin and cefepime which was stopped on 10/30. High-resolution CT scan of the chest showed bronchopneumonia, ID started him back on Augmentin to complete 7 more days. He completed Augmentin on 11/7 Monitor white blood cell. Trending down.   2-Acute respiratory failure with hypoxemia due to acute on chronic systolic heart  failure: Last echo July 2022 ejection fraction 20% Continue with  ivabradine, digoxin, midodrine and milrinone. Heart failure team following and managing Underwent cath 11/16, Milrirone increase  to 0.375. Not a transplant candidate per [name]. Potential date for VAD is 11/25. Lasix increase to 80 mg IV BID>   Dyslipidemia;  -Continue with atorvastatin.  Right upper extremity DVT with neuropathy: Continue with apixaban and gabapentin Will need to be transition to heparin close to date for surgery   Depression: Continue with sertraline  BPH: Continue with Flomax Protein caloric malnutrition: Continue with Ensure, calorie count.  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   Mild hyponatremia in the setting of heart failure  A. fib with RVR: He was a started on amiodarone infusion. With apixaban  Hypokalemia: Replaced  Anemia: Iron deficiency: Received IV iron x2 Hemoglobin stable 8.1 B12 level low normal range. Start B12 supplement.   Severe Protein caloric malnutrition.  On supplement.  Follow by nutritionist.    Nutrition Problem: Severe Malnutrition Etiology: chronic illness (CHF)    Signs/Symptoms: severe muscle depletion, severe fat depletion    Interventions: MVI, Prostat, Boost Breeze  Estimated body mass index is 23.32 kg/m as calculated from the following:   Height as of this encounter: 6' (1.829 m).   Weight as of this encounter: 78 kg.   DVT prophylaxis: Eliquis Code Status: Full code Family Communication: sister who was at bedside 11/16. Disposition Plan:  Status is: Inpatient  Remains inpatient appropriate because: Admitted with heart failure exacerbation, pneumonia.        Consultants:  Heart failure team  Procedures:  Right side cardiac cath 11/16  Antimicrobials:    Subjective: He is feeling better today, he was able to rest and sleep better  last night.  He is breathing better.  He will continue to ambulate on the hall.   Objective: Vitals:   03/28/21 2338 03/29/21 0410 03/29/21 0500 03/29/21 0713  BP: (!) 89/60 99/68  (!) 85/59  Pulse: 96 (!) 109  93  Resp: 20 18  20   Temp: 97.6 F (36.4 C) 97.6 F (36.4 C)  97.8 F (36.6 C)  TempSrc: Oral Oral  Oral  SpO2: 94% 91%  98%  Weight:   78 kg   Height:        Intake/Output Summary (Last 24 hours) at 03/29/2021 0742 Last data filed at 03/29/2021 0300 Gross per 24 hour  Intake 1052.87 ml  Output 3100 ml  Net -2047.13 ml    Filed Weights   03/27/21 0500 03/28/21 0445 03/29/21 0500  Weight: 78.7 kg 78.7 kg 78 kg    Examination:  General exam: NAD Respiratory system: BL crackles.  Cardiovascular system: S 1, S 2 RRR Gastrointestinal system: BS present, soft, nt Central nervous system: alert Extremities: trace edema   Data Reviewed: I have personally reviewed following labs and imaging studies  CBC: Recent Labs  Lab 03/25/21 0435 03/26/21 0435 03/27/21 0400 03/28/21 0335 03/28/21 0909 03/29/21 0600  WBC 12.8* 9.4 10.6* 12.0*  --  11.3*  HGB 9.0* 8.8* 8.4* 8.4* 9.2*  9.5* 8.1*  HCT 28.8* 28.9* 27.6* 26.9* 27.0*  28.0* 27.1*  MCV 79.1* 79.8* 79.8* 79.6*  --  80.9  PLT 327 325 318 343  --  147    Basic Metabolic Panel: Recent Labs  Lab 03/24/21 0223 03/25/21 0435 03/26/21 0435 03/27/21 0400 03/28/21 0335 03/28/21 0909 03/29/21 0600  NA 126* 128* 131* 130* 132* 135  134* 132*  K 3.9 3.5 3.8 4.1 4.1 3.9  4.0 3.7  CL 88* 88* 92* 91* 93*  --  91*  CO2 28 29 31 31 31   --  33*  GLUCOSE 226* 225* 159* 175* 118*  --  127*  BUN 24* 22 23 24* 26*  --  26*  CREATININE 0.64 0.64 0.68 0.60* 0.65  --  0.73  CALCIUM 8.9 8.5* 8.9 8.9 9.0  --  9.1  MG 2.0  --   --  2.1 1.9  --  2.1    GFR: Estimated Creatinine Clearance: 98.3 mL/min (by C-G formula based on SCr of 0.73 mg/dL). Liver Function Tests: No results for input(s): AST, ALT, ALKPHOS,  BILITOT, PROT, ALBUMIN in the last 168 hours. No results for input(s): LIPASE, AMYLASE in the last 168 hours. No results for input(s): AMMONIA in the last 168 hours. Coagulation Profile: No results for input(s): INR, PROTIME in the last 168 hours. Cardiac Enzymes: No results for input(s): CKTOTAL, CKMB, CKMBINDEX, TROPONINI in the last 168 hours. BNP (last 3 results) No results for input(s): PROBNP in the last 8760 hours. HbA1C: No results for input(s): HGBA1C in the last 72 hours. CBG: No results for input(s): GLUCAP in the last 168 hours. Lipid Profile: No results for input(s): CHOL, HDL, LDLCALC, TRIG, CHOLHDL, LDLDIRECT in the last 72 hours. Thyroid Function Tests: No results for input(s): TSH, T4TOTAL, FREET4, T3FREE, THYROIDAB in the last 72 hours. Anemia Panel: No results for input(s): VITAMINB12, FOLATE, FERRITIN, TIBC, IRON, RETICCTPCT in the last 72 hours. Sepsis Labs: No results for input(s): PROCALCITON, LATICACIDVEN in the last 168 hours.  No results found for this or any previous visit (from the past 240 hour(s)).       Radiology Studies: CARDIAC CATHETERIZATION  Result Date: 03/28/2021 Findings: On milrinone 0.25 mcg/kg/min RA = 2 RV = 63/2 PA = 65/27 (44) PCW = 28 (v = 35) Fick cardiac output/index = 6.5/3.2 PVR = 2.5 SVR = 960 Ao sat = 93% PA sat = 57%, 57% PAPi = 19 Assessment: 1. Persistently elevated left-sided filling pressures with pulmonary venous HTN and normal cardiac output Plan/Discussion: Continue diuresis. Will try to increase milrinone to 0.375. Glori Bickers, MD 9:24 AM       Scheduled Meds:  (feeding supplement) PROSource Plus  30 mL Oral QID   apixaban  5 mg Oral BID   atorvastatin  80 mg Oral QPM   Chlorhexidine Gluconate Cloth  6 each Topical Daily   clonazepam  0.25 mg Oral BID   digoxin  0.125 mg Oral Daily   diphenhydrAMINE  50 mg Oral QHS   ezetimibe  10 mg Oral Daily   feeding supplement  237 mL Oral TID BM   furosemide  80 mg  Intravenous BID   gabapentin  200 mg Oral TID   lidocaine  1 patch Transdermal Q24H   melatonin  5 mg Oral QHS   midodrine  10 mg Oral TID WC   multivitamin with minerals  1 tablet Oral Daily   pantoprazole  40 mg Oral Daily   polyethylene glycol  17 g Oral Daily   potassium chloride  40 mEq Oral TID   senna  2 tablet Oral BID   sertraline  100 mg Oral Daily   sodium chloride flush  10-40 mL Intracatheter Q12H   sodium chloride flush  3 mL Intravenous Q12H   sodium chloride flush  3 mL Intravenous Q12H   tamsulosin  0.4 mg Oral Daily   Continuous Infusions:  sodium chloride     amiodarone 30 mg/hr (03/29/21 0200)   milrinone 0.375 mcg/kg/min (03/29/21 0420)     LOS: 21 days    Time spent: 35 Minutes.     Elmarie Shiley, MD [facility]   If 7PM-7AM, please contact night-coverage [email or web address]  03/29/2021, 7:42 AM

## 2021-03-29 NOTE — Progress Notes (Signed)
CSW met with patient at bedside. Patient reports he slept 6 hours straight last night and was in good spirits. Patient states he is beginning to feel better and is hopeful. He states he didn't realize how bad he has been feeling for so long. CSW provided supportive intervention and will continue to follow throughout hospitalization. [name], [place], [place]

## 2021-03-29 NOTE — Progress Notes (Signed)
CARDIAC REHAB PHASE I   Offered to walk with pt. Pt with company at bedside, plans to ambulate with mobility tech later for 2 walks. Pt in good spirits today. Encouraged nutrition and ambulation. Will continue to follow.  [phone] Rufina Falco, RN BSN 03/29/2021 10:26 AM

## 2021-03-29 NOTE — Progress Notes (Signed)
Mobility Specialist Progress Note    03/29/21 1100  Mobility  Activity Ambulated in hall  Level of Assistance Modified independent, requires aide device or extra time  Assistive Device  (IV pole)  Distance Ambulated (ft) 610 ft  Mobility Ambulated independently in hallway  Mobility Response Tolerated well  Mobility performed by Mobility specialist  Bed Position Chair  $Mobility charge 1 Mobility   Pt received in chair and agreeable. Ambulated on 4LO2 and maintained SpO2 >/= 94%. Towards end c/o feeling a little winded and tired. Returned to chair with call bell in reach.   Hildred Alamin Mobility Specialist  Mobility Specialist Phone: [phone]

## 2021-03-29 NOTE — Progress Notes (Signed)
Nutrition Follow-up  DOCUMENTATION CODES:   Severe malnutrition in context of chronic illness  INTERVENTION:   - Ensure Enlive po TID, each supplement provides 350 kcal and 20 grams of protein   - ProSource Plus 30 ml po QID, each supplement provides 100 kcal and 15 grams of protein   - Continue MVI with minerals daily   - Encourage PO intake  - Continue with liberalized Regular diet  NUTRITION DIAGNOSIS:   Severe Malnutrition related to chronic illness (CHF) as evidenced by severe muscle depletion, severe fat depletion.  Ongoing, being addressed via oral nutrition supplements  GOAL:   Patient will meet greater than or equal to 90% of their needs  Met via PO intake (see calorie count notes)  MONITOR:   PO intake, Supplement acceptance, Weight trends, Labs, I & O's  REASON FOR ASSESSMENT:   Consult Enteral/tube feeding initiation and management, Assessment of nutrition requirement/status  ASSESSMENT:   65 y.o. male presented to the ED with sudden onset of dyspnea. PMH includes CAD, CHF with ICD, HTN, and on 2L [place] at baseline. Pt admitted with respiratory failure with hypoxia with SIRS.  11/16 - s/p right heart cath  A second calorie count was completed for pt due to prealbumin decreasing. Pt was found to meet >100% of kcal and protein needs on day 1. On day 2, pt was found to meet 91% of kcal needs (no breakfast due to being NPO) and >100% of protein needs.  Spoke with pt at bedside. Pt with 100% completed lunch meal tray on tray table (652 kcal, 16 grams of protein). Pt reports continued compliance with consuming Ensure Enlive and ProSource Plus supplements. Will continue with current oral nutrition supplement regimen at this time.  Noted LVAD tentatively planned for 11/25.  Admit weight: 76.9 kg Current weight: 78 kg  Pt with mild pitting edema to BLE.  Meal Completion: 50-100%  Medications reviewed and include: ProSource Plus 30 ml QID, Ensure Enlive TID,  folic acid, IV lasix 80 mg BID, melatonin, MVI with minerals, protonix, miralax, klor-con 40 mEq TID, senna, vitamin B-12 100 mcg daily, amiodarone drip, milrinone drip  Labs reviewed: vitamin B-12 280, sodium 132, BUN 26, hemoglobin 8.1  UOP: 3100 ml x 24 hours I/O's: -18.4 L since admit  Diet Order:   Diet Order             Diet regular Room service appropriate? Yes; Fluid consistency: Thin  Diet effective now                   EDUCATION NEEDS:   No education needs have been identified at this time  Skin:  Skin Assessment: Reviewed RN Assessment  Last BM:  03/27/21  Height:   Ht Readings from Last 1 Encounters:  03/07/21 6' (1.829 m)    Weight:   Wt Readings from Last 1 Encounters:  03/29/21 78 kg    Ideal Body Weight:  80.9 kg  BMI:  Body mass index is 23.32 kg/m.  Estimated Nutritional Needs:   Kcal:  2300-2500  Protein:  115-130 grams  Fluid:  >/= 2 L    Gustavus Bryant, MS, RD, LDN Inpatient Clinical Dietitian Please see AMiON for contact information.

## 2021-03-29 NOTE — Progress Notes (Signed)
Mobility Specialist Progress Note    03/29/21 1516  Mobility  Activity Ambulated in hall  Level of Assistance Modified independent, requires aide device or extra time  Assistive Device  (IV pole)  Distance Ambulated (ft) 500 ft  Mobility Ambulated independently in hallway  Mobility Response Tolerated well  Mobility performed by Mobility specialist  Bed Position Chair  $Mobility charge 1 Mobility   Pt received in chair and agreeable. No complaints on walk. Ambulated on 4LO2. SpO2 would periodically drop to 86% while conversing but recovered with no break using pursed lip breathing. Returned to chair with call bell in reach.   [facility] Mobility Specialist  M.S. Primary Phone: [phone] M.S. Secondary Phone: [phone]

## 2021-03-30 DIAGNOSIS — J9621 Acute and chronic respiratory failure with hypoxia: Secondary | ICD-10-CM | POA: Diagnosis not present

## 2021-03-30 DIAGNOSIS — I509 Heart failure, unspecified: Secondary | ICD-10-CM | POA: Diagnosis not present

## 2021-03-30 LAB — BASIC METABOLIC PANEL
Anion gap: 6 (ref 5–15)
BUN: 25 mg/dL — ABNORMAL HIGH (ref 8–23)
CO2: 33 mmol/L — ABNORMAL HIGH (ref 22–32)
Calcium: 9.1 mg/dL (ref 8.9–10.3)
Chloride: 94 mmol/L — ABNORMAL LOW (ref 98–111)
Creatinine, Ser: 0.7 mg/dL (ref 0.61–1.24)
GFR, Estimated: >60 mL/min (ref >60)
Glucose, Bld: 153 mg/dL — ABNORMAL HIGH (ref 70–99)
Potassium: 3.9 mmol/L (ref 3.5–5.1)
Sodium: 133 mmol/L — ABNORMAL LOW (ref 135–145)

## 2021-03-30 LAB — CBC
HCT: 27.8 % — ABNORMAL LOW (ref 39.0–52.0)
Hemoglobin: 8.3 g/dL — ABNORMAL LOW (ref 13.0–17.0)
MCH: 24.5 pg — ABNORMAL LOW (ref 26.0–34.0)
MCHC: 29.9 g/dL — ABNORMAL LOW (ref 30.0–36.0)
MCV: 82 fL (ref 80.0–100.0)
Platelets: 340 10*3/uL (ref 150–400)
RBC: 3.39 MIL/uL — ABNORMAL LOW (ref 4.22–5.81)
RDW: 20.3 % — ABNORMAL HIGH (ref 11.5–15.5)
WBC: 9.7 10*3/uL (ref 4.0–10.5)
nRBC: 0 % (ref 0.0–0.2)

## 2021-03-30 LAB — MAGNESIUM: Magnesium: 2 mg/dL (ref 1.7–2.4)

## 2021-03-30 LAB — COOXEMETRY PANEL
Carboxyhemoglobin: 2 % — ABNORMAL HIGH (ref 0.5–1.5)
Methemoglobin: 0.8 % (ref 0.0–1.5)
O2 Saturation: 66.5 %
Total hemoglobin: 8.7 g/dL — ABNORMAL LOW (ref 12.0–16.0)

## 2021-03-30 MED ORDER — METOLAZONE 2.5 MG PO TABS
2.5000 mg | ORAL_TABLET | Freq: Once | ORAL | Status: AC
  Administered 2021-03-30: 2.5 mg via ORAL
  Filled 2021-03-30: qty 1

## 2021-03-30 MED ORDER — POLYVINYL ALCOHOL 1.4 % OP SOLN
1.0000 [drp] | Freq: Two times a day (BID) | OPHTHALMIC | Status: DC
  Administered 2021-03-30 – 2021-04-03 (×6): 1 [drp] via OPHTHALMIC
  Filled 2021-03-30: qty 15

## 2021-03-30 MED ORDER — ORAL CARE MOUTH RINSE
15.0000 mL | Freq: Two times a day (BID) | OROMUCOSAL | Status: DC
  Administered 2021-03-30 – 2021-04-05 (×9): 15 mL via OROMUCOSAL

## 2021-03-30 NOTE — Progress Notes (Signed)
PROGRESS NOTE    Brandon [name]  IEP:[id number] DOB: 10/19/1955 DOA: 03/07/2021 PCP: [name], MD   Brief Narrative: 65 year old with past medical history significant for CAD, chronic systolic heart failure with AICD extracted 7/20-second/2022 due to infection, completed antibiotics on 01/15/2021 with Rocephin, he has moderate aortic stenosis, right upper extremity DVT presents to the ED with acute onset of dyspnea.  He was found to be hypoxic placed on 2 L of oxygen found to be in acute decompensated heart failure.  Cardiology consulted.  Significant events: 10/29 Co-ox 48%, started on milrinone 0.25.  10/30 IV Antibiotics stopped. Blood cultures no growth for 5 days.  10/31 started on augmentin. Milrinone cut back to 0.125 mcg.  03/12/2021 started on midodrine  03/24/2021 started back on milrinone   Assessment & Plan:   Principal Problem:   Acute on chronic respiratory failure with hypoxia (HCC) Active Problems:   Hyperlipidemia with target LDL less than 70   Essential hypertension   CAD (coronary artery disease)   Ischemic cardiomyopathy   Centrilobular emphysema (HCC)   Normocytic anemia   Respiratory failure (HCC)   Bacteremia   Systemic inflammatory response syndrome (SIRS) (HCC)   Chronic systolic CHF (congestive heart failure) (HCC)   Ankylosing spondylitis (HCC)   Leukocytosis   CAP (community acquired pneumonia)   ILD (interstitial lung disease) ([place])   Aspiration pneumonia of both upper lobes (HCC)   Protein-calorie malnutrition, severe  1-Pneumonia/sepsis: Sepsis present on admission, ruled in Patient was initially started on vancomycin and cefepime which was stopped on 10/30. High-resolution CT scan of the chest showed bronchopneumonia, ID started him back on Augmentin to complete 7 more days. He completed Augmentin on 11/7 WBC normalized.   2-Acute respiratory failure with hypoxemia due to Acute on Chronic Systolic Heart Failure: Last echo July 2022  ejection fraction 20% Continue with  ivabradine, digoxin, midodrine and milrinone. Heart failure team following and managing Underwent cath 11/16, Milrirone increase  to 0.375. Not a transplant candidate per [name]. Plan  for LVAD is 11/25. Continue with Lasix  80 mg IV BID>   Dyslipidemia;  -Continue with atorvastatin.  Right upper extremity DVT with neuropathy: Continue with apixaban and gabapentin Will need to be transition to heparin close to date for surgery   Depression: Continue with sertraline  BPH: Continue with Flomax Protein caloric malnutrition: Continue with Ensure, calorie count.  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   Mild hyponatremia in the setting of heart failure  A. fib with RVR: He was a started on amiodarone infusion. With apixaban  Hypokalemia: Replaced  Anemia: Iron deficiency: Received IV iron x2 Hemoglobin stable 8.1 B12 level low normal range. Start B12 supplement.   Severe Protein caloric malnutrition.  On supplement.  Follow by nutritionist.    Nutrition Problem: Severe Malnutrition Etiology: chronic illness (CHF)    Signs/Symptoms: severe muscle depletion, severe fat depletion    Interventions: MVI, Prostat, Boost Breeze  Estimated body mass index is 23.56 kg/m as calculated from the following:   Height as of this encounter: 6' (1.829 m).   Weight as of this encounter: 78.8 kg.   DVT prophylaxis: Eliquis Code Status: Full code Family Communication: sister who was at bedside 11/16. Disposition Plan:  Status is: Inpatient  Remains inpatient appropriate because: Admitted with heart failure exacerbation, pneumonia.        Consultants:  Heart failure team  Procedures:  Right side cardiac cath 11/16  Antimicrobials:    Subjective: He report orthopnea, sleep recliner last night.  Feels well this am.    Objective: Vitals:    03/29/21 2319 03/30/21 0322 03/30/21 0731 03/30/21 1131  BP: 102/68 101/68 108/79 98/64  Pulse: (!) 106 96 (!) 103 97  Resp: 20 20 20  (!) 21  Temp: 97.8 F (36.6 C) 97.6 F (36.4 C) (!) 96.6 F (35.9 C) (!) 97.2 F (36.2 C)  TempSrc: Oral Oral Axillary Axillary  SpO2: 96% 96% 99% 100%  Weight:  78.8 kg    Height:        Intake/Output Summary (Last 24 hours) at 03/30/2021 1222 Last data filed at 03/30/2021 1144 Gross per 24 hour  Intake 1904.67 ml  Output 2100 ml  Net -195.33 ml    Filed Weights   03/28/21 0445 03/29/21 0500 03/30/21 0322  Weight: 78.7 kg 78 kg 78.8 kg    Examination:  General exam: NAD Respiratory system: BL crackles.   Cardiovascular system: S 1, S 2 RRR Gastrointestinal system:BS present, soft, nt Central nervous system: Alert, follows command Extremities: trace edema   Data Reviewed: I have personally reviewed following labs and imaging studies  CBC: Recent Labs  Lab 03/26/21 0435 03/27/21 0400 03/28/21 0335 03/28/21 0909 03/29/21 0600 03/30/21 0510  WBC 9.4 10.6* 12.0*  --  11.3* 9.7  HGB 8.8* 8.4* 8.4* 9.2*  9.5* 8.1* 8.3*  HCT 28.9* 27.6* 26.9* 27.0*  28.0* 27.1* 27.8*  MCV 79.8* 79.8* 79.6*  --  80.9 82.0  PLT 325 318 343  --  340 818    Basic Metabolic Panel: Recent Labs  Lab 03/24/21 0223 03/25/21 0435 03/26/21 0435 03/27/21 0400 03/28/21 0335 03/28/21 0909 03/29/21 0600 03/30/21 0510  NA 126*   < > 131* 130* 132* 135  134* 132* 133*  K 3.9   < > 3.8 4.1 4.1 3.9  4.0 3.7 3.9  CL 88*   < > 92* 91* 93*  --  91* 94*  CO2 28   < > 31 31 31   --  33* 33*  GLUCOSE 226*   < > 159* 175* 118*  --  127* 153*  BUN 24*   < > 23 24* 26*  --  26* 25*  CREATININE 0.64   < > 0.68 0.60* 0.65  --  0.73 0.70  CALCIUM 8.9   < > 8.9 8.9 9.0  --  9.1 9.1  MG 2.0  --   --  2.1 1.9  --  2.1 2.0   < > = values in this interval not displayed.    GFR: Estimated Creatinine Clearance: 98.3 mL/min (by C-G formula based on SCr of 0.7  mg/dL). Liver Function Tests: No results for input(s): AST, ALT, ALKPHOS, BILITOT, PROT, ALBUMIN in the last 168 hours. No results for input(s): LIPASE, AMYLASE in the last 168 hours. No results for input(s): AMMONIA in the last 168 hours. Coagulation Profile: No results for input(s): INR, PROTIME in the last 168 hours. Cardiac Enzymes: No results for input(s): CKTOTAL, CKMB, CKMBINDEX, TROPONINI in the last 168 hours. BNP (last 3 results) No results for input(s): PROBNP in the last 8760 hours. HbA1C: No results for input(s): HGBA1C in the last 72 hours. CBG: No results for input(s): GLUCAP in the last 168 hours. Lipid Profile: No results for input(s): CHOL, HDL, LDLCALC, TRIG, CHOLHDL, LDLDIRECT in the last 72 hours. Thyroid Function Tests: No results for input(s): TSH, T4TOTAL, FREET4, T3FREE, THYROIDAB in  the last 72 hours. Anemia Panel: Recent Labs    03/29/21 0600  VITAMINB12 280   Sepsis Labs: No results for input(s): PROCALCITON, LATICACIDVEN in the last 168 hours.  No results found for this or any previous visit (from the past 240 hour(s)).       Radiology Studies: No results found.      Scheduled Meds:  (feeding supplement) PROSource Plus  30 mL Oral QID   apixaban  5 mg Oral BID   atorvastatin  80 mg Oral QPM   Chlorhexidine Gluconate Cloth  6 each Topical Daily   clonazepam  0.25 mg Oral BID   digoxin  0.125 mg Oral Daily   diphenhydrAMINE  50 mg Oral QHS   ezetimibe  10 mg Oral Daily   feeding supplement  237 mL Oral TID BM   folic acid  1 mg Oral Daily   furosemide  80 mg Intravenous BID   gabapentin  200 mg Oral TID   lidocaine  1 patch Transdermal Q24H   mouth rinse  15 mL Mouth Rinse BID   melatonin  5 mg Oral QHS   metolazone  2.5 mg Oral Once   midodrine  10 mg Oral TID WC   multivitamin with minerals  1 tablet Oral Daily   pantoprazole  40 mg Oral Daily   polyethylene glycol  17 g Oral Daily   potassium chloride  40 mEq Oral TID    senna  2 tablet Oral BID   sertraline  100 mg Oral Daily   sodium chloride flush  10-40 mL Intracatheter Q12H   sodium chloride flush  3 mL Intravenous Q12H   sodium chloride flush  3 mL Intravenous Q12H   tamsulosin  0.4 mg Oral Daily   vitamin B-12  100 mcg Oral Daily   Continuous Infusions:  sodium chloride     amiodarone 30 mg/hr (03/30/21 1112)   milrinone 0.375 mcg/kg/min (03/30/21 0320)     LOS: 22 days    Time spent: 35 Minutes.     Elmarie Shiley, MD [facility]   If 7PM-7AM, please contact night-coverage [email or web address]  03/30/2021, 12:22 PM

## 2021-03-30 NOTE — Progress Notes (Signed)
Mobility Specialist Progress Note    03/30/21 1529  Mobility  Activity Ambulated in hall  Level of Assistance Modified independent, requires aide device or extra time  Assistive Device  (IV pole)  Distance Ambulated (ft) 510 ft  Mobility Ambulated independently in hallway  Mobility Response Tolerated well  Mobility performed by Mobility specialist  Bed Position Chair  $Mobility charge 1 Mobility   Pt received in chair and agreeable. No complaints on walk. Ambulated on 3LO2. Took no breaks. Returned to chair with call bell in reach.   [facility] Mobility Specialist  M.S. Primary Phone: [phone] M.S. Secondary Phone: [phone]

## 2021-03-30 NOTE — Progress Notes (Addendum)
Patient ID: Brandon [name], male   DOB: [birth year], 65 y.o.   MRN: [id number]     Advanced Heart Failure Rounding Note  PCP-Cardiologist: [name], MD   Subjective:    Admitted with sepsis. Started on cefepime + vanc, now off abx (ID following). Afebrile.  10/27 started on ivabradine 7.5 mg twice a day and midodrine 5 mg TID 10/29 Co-ox 48%, started on milrinone 0.25.  10/30 IV Antibiotics stopped. Blood cultures no growth for 5 days.  10/31 started on Augmentin for PNA.  11/1 Milrinone off 11/2 Milrinone added back. Midodrine increased to 10 mg TID 11/8 Placed on 80 mg po lasix  11/10 Developed Afib w/ RVR, converted with IV amio 11/13 Give 250 cc NS bolus for low CVP.  11/14 Milrinone increased to 0.25 mcg. Diuresed with IV lasix  11/16 Had RHC with elevated left sided filling pressures PCWP 28, PA sat 57%, and normal cardiac output. Milrinone was increased to 0.375 mcg. Continued to diurese with IV lasix. Negative 2 liters.   Milrinone 0.375 mcg --> CO-OX 66.5%.  Remains on amio drip 30 mg per hour.  Hgb 9.2>8.1 >8.3   Overnight he had trouble sleeping in the bed because of orthopnea. Feels better today.       Objective:   Weight Range: 78.8 kg Body mass index is 23.56 kg/m.   Vital Signs:   Temp:  [96.6 F (35.9 C)-97.8 F (36.6 C)] 96.6 F (35.9 C) (11/18 0731) Pulse Rate:  [96-106] 103 (11/18 0731) Resp:  [18-22] 20 (11/18 0731) BP: (89-108)/(60-79) 108/79 (11/18 0731) SpO2:  [96 %-100 %] 99 % (11/18 0731) Weight:  [78.8 kg] 78.8 kg (11/18 0322) Last BM Date: 03/27/21  Weight change: Filed Weights   03/28/21 0445 03/29/21 0500 03/30/21 0322  Weight: 78.7 kg 78 kg 78.8 kg    Intake/Output:   Intake/Output Summary (Last 24 hours) at 03/30/2021 0903 Last data filed at 03/30/2021 0614 Gross per 24 hour  Intake 960 ml  Output 1150 ml  Net -190 ml     Physical Exam  CVP 4 personally checked in the chair.  General:   No resp difficulty HEENT:  normal Neck: supple. no JVD. Carotids 2+ bilat; no bruits. No lymphadenopathy or thryomegaly appreciated. Cor: PMI nondisplaced. Regular rate & rhythm. No rubs, gallops or murmurs. Lungs: clear on 4 liters [place]  Abdomen: soft, nontender, nondistended. No hepatosplenomegaly. No bruits or masses. Good bowel sounds. Extremities: no cyanosis, clubbing, rash, edema. RUE PICC  Neuro: alert & orientedx3, cranial nerves grossly intact. moves all 4 extremities w/o difficulty. Affect pleasant  Telemetry  SR-ST 90-100 personally reviewed.    Labs    CBC Recent Labs    03/29/21 0600 03/30/21 0510  WBC 11.3* 9.7  HGB 8.1* 8.3*  HCT 27.1* 27.8*  MCV 80.9 82.0  PLT 340 269   Basic Metabolic Panel Recent Labs    03/29/21 0600 03/30/21 0510  NA 132* 133*  K 3.7 3.9  CL 91* 94*  CO2 33* 33*  GLUCOSE 127* 153*  BUN 26* 25*  CREATININE 0.73 0.70  CALCIUM 9.1 9.1  MG 2.1 2.0   Liver Function Tests No results for input(s): AST, ALT, ALKPHOS, BILITOT, PROT, ALBUMIN in the last 72 hours.  No results for input(s): LIPASE, AMYLASE in the last 72 hours. Cardiac Enzymes No results for input(s): CKTOTAL, CKMB, CKMBINDEX, TROPONINI in the last 72 hours.  BNP: BNP (last 3 results) Recent Labs    11/30/20 0144 01/08/21 0731  01/18/21 1629  BNP 2,653.1* 734.2* 607.9*    ProBNP (last 3 results) No results for input(s): PROBNP in the last 8760 hours.    D-Dimer No results for input(s): DDIMER in the last 72 hours. Hemoglobin A1C No results for input(s): HGBA1C in the last 72 hours. Fasting Lipid Panel No results for input(s): CHOL, HDL, LDLCALC, TRIG, CHOLHDL, LDLDIRECT in the last 72 hours. Thyroid Function Tests No results for input(s): TSH, T4TOTAL, T3FREE, THYROIDAB in the last 72 hours.  Invalid input(s): FREET3  Other results:   Imaging    No results found.   Medications:     Scheduled Medications:  (feeding supplement) PROSource Plus  30 mL Oral QID   apixaban   5 mg Oral BID   atorvastatin  80 mg Oral QPM   Chlorhexidine Gluconate Cloth  6 each Topical Daily   clonazepam  0.25 mg Oral BID   digoxin  0.125 mg Oral Daily   diphenhydrAMINE  50 mg Oral QHS   ezetimibe  10 mg Oral Daily   feeding supplement  237 mL Oral TID BM   folic acid  1 mg Oral Daily   furosemide  80 mg Intravenous BID   gabapentin  200 mg Oral TID   lidocaine  1 patch Transdermal Q24H   mouth rinse  15 mL Mouth Rinse BID   melatonin  5 mg Oral QHS   midodrine  10 mg Oral TID WC   multivitamin with minerals  1 tablet Oral Daily   pantoprazole  40 mg Oral Daily   polyethylene glycol  17 g Oral Daily   potassium chloride  40 mEq Oral TID   senna  2 tablet Oral BID   sertraline  100 mg Oral Daily   sodium chloride flush  10-40 mL Intracatheter Q12H   sodium chloride flush  3 mL Intravenous Q12H   sodium chloride flush  3 mL Intravenous Q12H   tamsulosin  0.4 mg Oral Daily   vitamin B-12  100 mcg Oral Daily    Infusions:  sodium chloride     amiodarone 30 mg/hr (03/30/21 0008)   milrinone 0.375 mcg/kg/min (03/30/21 0320)     PRN Medications: sodium chloride, acetaminophen **OR** acetaminophen, docusate sodium, guaiFENesin, ondansetron **OR** ondansetron (ZOFRAN) IV, sodium chloride flush, sodium chloride flush    Patient Profile   Brandon [name] is a 65 year old with history of smoker, CAD s/p previous MI, HTN, HL, chronic HFeEF, sepsis 11/2020 bacteremia.  Admitted with sepsis. Bld Cx - NGTD    Assessment/Plan    1. Acute on chronic systolic HFr EF - Echo 12/15/14 EF 20-25% RV mildly HK. moderate AS  Mean gradient 13 AVA 1.2 cm2 DI 0.30 - RHC (7/22): EF 20%, low filling pressures. CI 2.3.  - RHC 11/16  RA 2, PA 65/27 (44), PCW 28, SVR 960, CO 6.5,  CI 3.2 , PA 57%. Milrinone was increased to 0.375 mcg.  - Todays CO-OX 66.5 %. Continue milrinone 0.375 mcg. - CVP 4-5 Continue  lasix to 80 mg twice a day. Supp K  -- No bb with low output HF - No ivabradine given  AF   - Continue digoxin. Dig level ok - Continue midodrine 10 mg TID   - Discussed with [name] -  not a candidate for transplant.  - VAD w/u underway. Will try to support in house with milrinone, PT/OT and adequate nutrition.  - Initially planned for possible VAD in early December, date moved up to 11/25 -  d/w Dr. Cyndia Bent -Dietitian appreciated.   2. Sepsis - H/o strep bacteremia 11/2020.  ICD extracted 12/04/20. Barostim was not removed as it is extravascular.  - Received IV antibiotics until 01/15/21. Blood cultures negative 01/30/21..  - Procalcitonin 0.22->0.11 - 10/26 -Blood CX x2  No growth 5 days.  - Ct chest concerning for pneumonia. Treated w/ cefepime/vancomycin -> Augmentin (stop 11/7) - Afebrile  3. Acute Hypoxic Respiratory Failure  - CXR with pulmonary edema initially, and was diuresed with IV Lasix.  -  CT chest completed 10/30 concerning for bronchopneumonia. On augmentin.  - Sats stable on 4 liters [place].    4. CAD  s/p anterior STEMI (12/20). LHC showed chronically occluded RCA (with L>>R collaterals) and thrombotic occlusion of proximal LAD. Underwent PCI of LAD. - LHC (7/22) with stable CAD.  - No s/s angina  - Continue atorvastatin 80. No aspirin d/t anticoagulation.  5. 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 H./O RUE DVT - On eliquis. Will need to change to heparin as we get closer to VAD  7. Severe protein-calorie malnutrition - prealbumin 11>15> 13 despite adequate caloric intake on calorie count - Pre-albumin dropping.  - Dietitian following with supplements adjusted.  - repeat calorie count underway  8. Microcytic anemia -Hgb drifting down. No obvious source of bleeding.  - MCV 79.  - Iron Sats 13%.  - Rec'd Feraheme 11/8 and 11/13   9. Paroxysmal Atrial Fibrillation w/ RVR - new, developed 11/10. Now back in NSR   - Maintaining NSR. Continue amio gtt 30/hr while on milrinone -  Eliquis 5 mg bid  - Mag stable 2.0K 3.9. K supp increased - Keep K > 4.0 and Mg >2.0   10. Hypokalemia - K 3.9  K supp increased   11. Hyponatremia - Sodium 133 today   - restrict free water  Length of Stay: [place], NP  03/30/2021, 9:03 AM  Advanced Heart Failure Team Pager [phone] (M-F; 7a - 5p)  Please contact [facility] for night-coverage after hours (5p -7a ) and weekends on [email or web address]  Patient seen and examined with the above-signed Advanced Practice Provider and/or Housestaff. I personally reviewed laboratory data, imaging studies and relevant notes. I independently examined the patient and formulated the important aspects of the plan. I have edited the note to reflect any of my changes or salient points. I have personally discussed the plan with the patient and/or family.  Continues with orthopnea despite IV lasix and milrinone.. Weight up 7 pounds. Co-ox 67%  Eating as much as he can. Remains in NSR on IV amio.   General:  Sitting in chaire No resp difficulty HEENT: normal Neck: supple. no JVD. Carotids 2+ bilat; no bruits. No lymphadenopathy or thryomegaly appreciated. Cor: PMI nondisplaced. Regular rate & rhythm. 2/6 AS Lungs: crackles in bases Abdomen: soft, nontender, nondistended. No hepatosplenomegaly. No bruits or masses. Good bowel sounds. Extremities: no cyanosis, clubbing, rash, edema Neuro: alert & orientedx3, cranial nerves grossly intact. moves all 4 extremities w/o difficulty. Affect pleasant  Volume is up. Co-ox ok on milrinone. Will continue IV lasix. Give dose of metolazone Continue nutritional support. D/w Dr. Cyndia Bent. Plan VAD next Friday.   Glori Bickers, MD  11:12 AM

## 2021-03-30 NOTE — Progress Notes (Signed)
Mobility Specialist Progress Note    03/30/21 1225  Mobility  Activity Ambulated in hall  Level of Assistance Modified independent, requires aide device or extra time  Assistive Device  (IV pole)  Distance Ambulated (ft) 510 ft  Mobility Ambulated independently in hallway  Mobility Response Tolerated well  Mobility performed by Mobility specialist  Bed Position Chair  $Mobility charge 1 Mobility   Pt received in chair and agreeable. No complaints. Ambulated on 3LO2 and SpO2 ranged 93-100%. Encouraged focus on breathing and able to converse throughout walk. Took no breaks and kept a steady pace. Returned to chair with call bell in reach.   [facility] Mobility Specialist  M.S. Primary Phone: [phone] M.S. Secondary Phone: [phone]

## 2021-03-30 NOTE — Progress Notes (Signed)
2 Days Post-Op Procedure(s) (LRB): RIGHT HEART CATH (N/A) Subjective: Some orthopnea. Anxious to feel better. He is eating all he can including protein supplements and walking twice daily, multiple laps.  Objective: Vital signs in last 24 hours: Temp:  [96.6 F (35.9 C)-97.8 F (36.6 C)] 96.6 F (35.9 C) (11/18 0731) Pulse Rate:  [96-106] 103 (11/18 0731) Cardiac Rhythm: Sinus tachycardia (11/18 0721) Resp:  [18-22] 20 (11/18 0731) BP: (89-108)/(60-79) 108/79 (11/18 0731) SpO2:  [96 %-100 %] 99 % (11/18 0731) Weight:  [78.8 kg] 78.8 kg (11/18 0322)  Hemodynamic parameters for last 24 hours: CVP:  [4 mmHg-6 mmHg] 6 mmHg  Intake/Output from previous day: 11/17 0701 - 11/18 0700 In: 1320 [P.O.:1320] Out: 1150 [Urine:1150] Intake/Output this shift: Total I/O In: 240 [P.O.:240] Out: -   General appearance: alert and cooperative Neurologic: intact Heart: regular rate and rhythm and systolic murmur: 3/6, harsh right sternal border Lungs: clear to auscultation bilaterally Extremities: extremities normal, atraumatic, no cyanosis or edema  Lab Results: Recent Labs    03/29/21 0600 03/30/21 0510  WBC 11.3* 9.7  HGB 8.1* 8.3*  HCT 27.1* 27.8*  PLT 340 340   BMET:  Recent Labs    03/29/21 0600 03/30/21 0510  NA 132* 133*  K 3.7 3.9  CL 91* 94*  CO2 33* 33*  GLUCOSE 127* 153*  BUN 26* 25*  CREATININE 0.73 0.70  CALCIUM 9.1 9.1    PT/INR: No results for input(s): LABPROT, INR in the last 72 hours. ABG    Component Value Date/Time   PHART 7.451 (H) 03/07/2021 1218   HCO3 33.4 (H) 03/28/2021 0909   HCO3 33.8 (H) 03/28/2021 0909   TCO2 35 (H) 03/28/2021 0909   TCO2 35 (H) 03/28/2021 0909   ACIDBASEDEF 7.0 (H) 11/30/2020 0839   O2SAT 66.5 03/30/2021 0510   CBG (last 3)  No results for input(s): GLUCAP in the last 72 hours.  RIGHT HEART CATH  03/28/21 Conclusion  Findings:   On milrinone 0.25 mcg/kg/min   RA = 2 RV = 63/2 PA = 65/27 (44) PCW = 28 (v =  35) Fick cardiac output/index = 6.5/3.2 PVR = 2.5 SVR = 960 Ao sat = 93% PA sat = 57%, 57% PAPi = 19   Assessment: 1. Persistently elevated left-sided filling pressures with pulmonary venous HTN and normal cardiac output   Plan/Discussion:   Continue diuresis. Will try to increase milrinone to 0.375.    Glori Bickers, MD  9:24 AM   Assessment/Plan:  He is hemodynamically stable on milrinone 0.375 but still has some orthopnea.  Continue to work on nutrition and mobility to prevent debilitation and deconditioning. Discussed avoiding pressure sores.  Plan HM3 LVAD next Friday.   LOS: 22 days    Gaye Pollack 03/30/2021

## 2021-03-31 LAB — CBC
HCT: 27.1 % — ABNORMAL LOW (ref 39.0–52.0)
Hemoglobin: 8.2 g/dL — ABNORMAL LOW (ref 13.0–17.0)
MCH: 24.5 pg — ABNORMAL LOW (ref 26.0–34.0)
MCHC: 30.3 g/dL (ref 30.0–36.0)
MCV: 80.9 fL (ref 80.0–100.0)
Platelets: 326 10*3/uL (ref 150–400)
RBC: 3.35 MIL/uL — ABNORMAL LOW (ref 4.22–5.81)
RDW: 20.3 % — ABNORMAL HIGH (ref 11.5–15.5)
WBC: 10.1 10*3/uL (ref 4.0–10.5)
nRBC: 0 % (ref 0.0–0.2)

## 2021-03-31 LAB — COOXEMETRY PANEL
Carboxyhemoglobin: 1.8 % — ABNORMAL HIGH (ref 0.5–1.5)
Methemoglobin: 0.7 % (ref 0.0–1.5)
O2 Saturation: 58.7 %
Total hemoglobin: 8.3 g/dL — ABNORMAL LOW (ref 12.0–16.0)

## 2021-03-31 LAB — BASIC METABOLIC PANEL
Anion gap: 8 (ref 5–15)
BUN: 22 mg/dL (ref 8–23)
CO2: 34 mmol/L — ABNORMAL HIGH (ref 22–32)
Calcium: 8.9 mg/dL (ref 8.9–10.3)
Chloride: 91 mmol/L — ABNORMAL LOW (ref 98–111)
Creatinine, Ser: 0.71 mg/dL (ref 0.61–1.24)
GFR, Estimated: >60 mL/min (ref >60)
Glucose, Bld: 100 mg/dL — ABNORMAL HIGH (ref 70–99)
Potassium: 3.4 mmol/L — ABNORMAL LOW (ref 3.5–5.1)
Sodium: 133 mmol/L — ABNORMAL LOW (ref 135–145)

## 2021-03-31 LAB — MAGNESIUM: Magnesium: 1.8 mg/dL (ref 1.7–2.4)

## 2021-03-31 MED ORDER — POTASSIUM CHLORIDE CRYS ER 20 MEQ PO TBCR
40.0000 meq | EXTENDED_RELEASE_TABLET | Freq: Once | ORAL | Status: AC
  Administered 2021-03-31: 40 meq via ORAL
  Filled 2021-03-31: qty 2

## 2021-03-31 MED ORDER — ACETAZOLAMIDE 250 MG PO TABS
250.0000 mg | ORAL_TABLET | Freq: Two times a day (BID) | ORAL | Status: AC
  Administered 2021-03-31 – 2021-04-01 (×4): 250 mg via ORAL
  Filled 2021-03-31 (×3): qty 1

## 2021-03-31 MED ORDER — MAGNESIUM SULFATE 2 GM/50ML IV SOLN
2.0000 g | Freq: Once | INTRAVENOUS | Status: AC
Start: 2021-03-31 — End: 2021-03-31
  Administered 2021-03-31: 2 g via INTRAVENOUS
  Filled 2021-03-31: qty 50

## 2021-03-31 MED ORDER — BIOTENE DRY MOUTH MT LIQD: 15.0000 mL | OROMUCOSAL | Status: DC | PRN

## 2021-03-31 NOTE — Progress Notes (Signed)
Mobility Specialist: Progress Note   03/31/21 1047  Mobility  Activity Ambulated in hall  Level of Assistance Modified independent, requires aide device or extra time  Assistive Device  (IV Pole)  Distance Ambulated (ft) 510 ft  Mobility Ambulated with assistance in hallway  Mobility Response Tolerated well  Mobility performed by Mobility specialist  Bed Position Chair  $Mobility charge 1 Mobility   Pre-Mobility: 102 HR, 100% SpO2 During Mobility: 111 HR Post-Mobility: 98 HR, 98% SpO2  Pt ambulated on 2 L/min [place]. Pt had no c/o throughout. Will f/u later today for second bout of ambulation.   [facility] Fransico Sciandra Mobility Specialist Mobility Specialist Phone #1: [phone] Mobility Specialist Phone #2: [phone]

## 2021-03-31 NOTE — Progress Notes (Addendum)
Patient ID: Brandon [name], male   DOB: 11/07/1955, 65 y.o.   MRN: [id number]     Advanced Heart Failure Rounding Note  PCP-Cardiologist: [name], MD   Subjective:    Admitted with sepsis. Started on cefepime + vanc, now off abx (ID following). Afebrile.  10/27 started on ivabradine 7.5 mg twice a day and midodrine 5 mg TID 10/29 Co-ox 48%, started on milrinone 0.25.  10/30 IV Antibiotics stopped. Blood cultures no growth for 5 days.  10/31 started on Augmentin for PNA.  11/1 Milrinone off 11/2 Milrinone added back. Midodrine increased to 10 mg TID 11/8 Placed on 80 mg po lasix  11/10 Developed Afib w/ RVR, converted with IV amio 11/13 Give 250 cc NS bolus for low CVP.  11/14 Milrinone increased to 0.25 mcg. Diuresed with IV lasix  11/16 Had RHC with elevated left sided filling pressures PCWP 28, PA sat 57%, and normal cardiac output. Milrinone was increased to 0.375 mcg. Continued to diurese with IV lasix. Negative 2 liters.   Remains on milrinone 0.375 mcg --> CO-OX 59%. Continue on IV lasix and amio. Almost 5L out yesterday. Weight down another 2 pounds.   Breathing better. No recurrent orthopnea last night. Walked 510 feet today. + DOE. No CP SCr stable. K 3.4 Bicarb climbing    Objective:   Weight Range: 77.9 kg Body mass index is 23.29 kg/m.   Vital Signs:   Temp:  [97.2 F (36.2 C)-97.9 F (36.6 C)] 97.5 F (36.4 C) (11/19 0744) Pulse Rate:  [97-102] 97 (11/19 0744) Resp:  [18-25] 18 (11/19 0744) BP: (98-105)/(64-78) 102/78 (11/19 0744) SpO2:  [93 %-100 %] 99 % (11/19 0744) Weight:  [77.9 kg] 77.9 kg (11/19 0354) Last BM Date: 03/30/21  Weight change: Filed Weights   03/29/21 0500 03/30/21 0322 03/31/21 0354  Weight: 78 kg 78.8 kg 77.9 kg    Intake/Output:   Intake/Output Summary (Last 24 hours) at 03/31/2021 1118 Last data filed at 03/31/2021 1112 Gross per 24 hour  Intake 3019.48 ml  Output 5650 ml  Net -2630.52 ml      Physical Exam   CVP  4 General:  Sitting in chair No resp difficulty HEENT: normal Neck: supple. no JVD. Carotids 2+ bilat; no bruits. No lymphadenopathy or thryomegaly appreciated. Cor: PMI nondisplaced. Regular rate & rhythm.2/6 AS Lungs: decreased in abses Abdomen: soft, nontender, nondistended. No hepatosplenomegaly. No bruits or masses. Good bowel sounds. Extremities: no cyanosis, clubbing, rash, edema Neuro: alert & orientedx3, cranial nerves grossly intact. moves all 4 extremities w/o difficulty. Affect pleasant   Telemetry   Sinus 90-100 Personally reviewed   Labs    CBC Recent Labs    03/30/21 0510 03/31/21 0400  WBC 9.7 10.1  HGB 8.3* 8.2*  HCT 27.8* 27.1*  MCV 82.0 80.9  PLT 340 761    Basic Metabolic Panel Recent Labs    03/30/21 0510 03/31/21 0400  NA 133* 133*  K 3.9 3.4*  CL 94* 91*  CO2 33* 34*  GLUCOSE 153* 100*  BUN 25* 22  CREATININE 0.70 0.71  CALCIUM 9.1 8.9  MG 2.0 1.8    Liver Function Tests No results for input(s): AST, ALT, ALKPHOS, BILITOT, PROT, ALBUMIN in the last 72 hours.  No results for input(s): LIPASE, AMYLASE in the last 72 hours. Cardiac Enzymes No results for input(s): CKTOTAL, CKMB, CKMBINDEX, TROPONINI in the last 72 hours.  BNP: BNP (last 3 results) Recent Labs    11/30/20 0144 01/08/21 0731 01/18/21  1629  BNP 2,653.1* 734.2* 607.9*     ProBNP (last 3 results) No results for input(s): PROBNP in the last 8760 hours.    D-Dimer No results for input(s): DDIMER in the last 72 hours. Hemoglobin A1C No results for input(s): HGBA1C in the last 72 hours. Fasting Lipid Panel No results for input(s): CHOL, HDL, LDLCALC, TRIG, CHOLHDL, LDLDIRECT in the last 72 hours. Thyroid Function Tests No results for input(s): TSH, T4TOTAL, T3FREE, THYROIDAB in the last 72 hours.  Invalid input(s): FREET3  Other results:   Imaging    No results found.   Medications:     Scheduled Medications:  (feeding supplement) PROSource Plus   30 mL Oral QID   apixaban  5 mg Oral BID   atorvastatin  80 mg Oral QPM   Chlorhexidine Gluconate Cloth  6 each Topical Daily   clonazepam  0.25 mg Oral BID   digoxin  0.125 mg Oral Daily   diphenhydrAMINE  50 mg Oral QHS   ezetimibe  10 mg Oral Daily   feeding supplement  237 mL Oral TID BM   folic acid  1 mg Oral Daily   furosemide  80 mg Intravenous BID   gabapentin  200 mg Oral TID   lidocaine  1 patch Transdermal Q24H   mouth rinse  15 mL Mouth Rinse BID   melatonin  5 mg Oral QHS   midodrine  10 mg Oral TID WC   multivitamin with minerals  1 tablet Oral Daily   pantoprazole  40 mg Oral Daily   polyethylene glycol  17 g Oral Daily   polyvinyl alcohol  1 drop Both Eyes BID   potassium chloride  40 mEq Oral TID   senna  2 tablet Oral BID   sertraline  100 mg Oral Daily   sodium chloride flush  10-40 mL Intracatheter Q12H   sodium chloride flush  3 mL Intravenous Q12H   sodium chloride flush  3 mL Intravenous Q12H   tamsulosin  0.4 mg Oral Daily   vitamin B-12  100 mcg Oral Daily    Infusions:  sodium chloride     amiodarone 30 mg/hr (03/31/21 1112)   milrinone 0.375 mcg/kg/min (03/31/21 0400)     PRN Medications: sodium chloride, acetaminophen **OR** acetaminophen, antiseptic oral rinse, docusate sodium, guaiFENesin, ondansetron **OR** ondansetron (ZOFRAN) IV, sodium chloride flush, sodium chloride flush    Patient Profile   Mr Geisel is a 65 year old with history of smoker, CAD s/p previous MI, HTN, HL, chronic HFeEF, sepsis 11/2020 bacteremia.  Admitted with sepsis. Bld Cx - NGTD    Assessment/Plan    1. Acute on chronic systolic HFr EF - Echo 11/14/06 EF 20-25% RV mildly HK. moderate AS  Mean gradient 13 AVA 1.2 cm2 DI 0.30 - RHC (7/22): EF 20%, low filling pressures. CI 2.3.  - RHC 11/16  RA 2, PA 65/27 (44), PCW 28, SVR 960, CO 6.5,  CI 3.2 , PA 57%. Milrinone was increased to 0.375 mcg.  - Todays CO-OX 59 %. Continue milrinone 0.375 mcg. - CVP 4 Continue   IV lasix 80mg  bid. Supp K. Bicarb climbing. Will give diamox x 4 doses.  -- No bb with low output HF - No ivabradine given AF   - Continue digoxin. Dig level ok - Continue midodrine 10 mg TID   - Discussed with [name] -  not a candidate for transplant.  - VAD with AVR planned for 11/15. Continue inotropic support, ambulation and aggressive  nutrition interventions.   2. Sepsis - H/o strep bacteremia 11/2020.  ICD extracted 12/04/20. Barostim was not removed as it is extravascular.  - Received IV antibiotics until 01/15/21. Blood cultures negative 01/30/21..  - Procalcitonin 0.22->0.11 - 10/26 -Blood CX x2  No growth 5 days.  - Ct chest concerning for pneumonia. Treated w/ cefepime/vancomycin -> Augmentin (stop 11/7) - Resolved  3. Acute Hypoxic Respiratory Failure  - CXR with pulmonary edema initially, and was diuresed with IV Lasix.  -  CT chest completed 10/30 concerning for bronchopneumonia. On augmentin.  - Improved/resolved.  - Continue with diuresis    4. CAD  s/p anterior STEMI (12/20). LHC showed chronically occluded RCA (with L>>R collaterals) and thrombotic occlusion of proximal LAD. Underwent PCI of LAD. - LHC (7/22) with stable CAD.  - No s/s angina  - Continue atorvastatin 80. No aspirin d/t anticoagulation.  5. 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 H./O RUE DVT - On eliquis. Will need to change to heparin as we get closer to VAD  7. Severe protein-calorie malnutrition - prealbumin 11>15> 13 despite adequate caloric intake on calorie count - Pre-albumin dropping.  - Dietitian following with supplements adjusted.  - repeat calorie count underway  8. Iron-deficiency anemia - Hgb stable today at 8.1  - Rec'd Feraheme 11/8 and 11/13   9. Paroxysmal Atrial Fibrillation w/ RVR - new, developed 11/10. Now back in NSR   - Maintaining NSR. Continue amio gtt 30/hr while on milrinone - Switch Eliquis to  heparin tomorrow - Mag stable 1.8 K 3.4 K will supp  - Keep K > 4.0 and Mg >2.0   10. Hypokalemia - K 3.4  K supp increased   11. Hyponatremia - Sodium stable @ 133 today   - restrict free water  Length of Stay: [place], MD  03/31/2021, 11:18 AM  Advanced Heart Failure Team Pager [phone] (M-F; 7a - 5p)  Please contact [facility] for night-coverage after hours (5p -7a ) and weekends on [email or web address]

## 2021-03-31 NOTE — Progress Notes (Signed)
Mobility Specialist: Progress Note   03/31/21 1616  Mobility  Activity Ambulated in hall  Level of Assistance Modified independent, requires aide device or extra time  Assistive Device  (IV Pole)  Distance Ambulated (ft) 510 ft  Mobility Ambulated with assistance in hallway  Mobility Response Tolerated well  Mobility performed by Mobility specialist  $Mobility charge 1 Mobility   Pre-Mobility: 98 HR, 96% SpO2 Post-Mobility: 102 HR, 95% SpO2  Pt ambulated on 2 L/min , no c/o throughout. Pt back to bed after walk with call bell at his side. Family member present in the room.   [facility] Antos Mobility Specialist Mobility Specialist Phone #1: [phone] Mobility Specialist Phone #2: [phone]

## 2021-03-31 NOTE — Plan of Care (Signed)

## 2021-03-31 NOTE — Progress Notes (Addendum)
PROGRESS NOTE    Brandon [name]  ZOX:[id number] DOB: [birth year] DOA: 03/07/2021 PCP: [name], MD   Brief Narrative: 65 year old with past medical history significant for CAD, chronic systolic heart failure with AICD extracted 7/20-second/2022 due to infection, completed antibiotics on 01/15/2021 with Rocephin, he has moderate aortic stenosis, right upper extremity DVT presents to the ED with acute onset of dyspnea.  He was found to be hypoxic placed on 2 L of oxygen found to be in acute decompensated heart failure.  Cardiology consulted.  Significant events: 10/29 Co-ox 48%, started on milrinone 0.25.  10/30 IV Antibiotics stopped. Blood cultures no growth for 5 days.  10/31 started on augmentin. Milrinone cut back to 0.125 mcg.  03/12/2021 started on midodrine  03/24/2021 started back on milrinone   Assessment & Plan:   Principal Problem:   Acute on chronic respiratory failure with hypoxia (HCC) Active Problems:   Hyperlipidemia with target LDL less than 70   Essential hypertension   CAD (coronary artery disease)   Ischemic cardiomyopathy   Centrilobular emphysema (HCC)   Normocytic anemia   Respiratory failure (HCC)   Bacteremia   Systemic inflammatory response syndrome (SIRS) (HCC)   Chronic systolic CHF (congestive heart failure) (HCC)   Ankylosing spondylitis (HCC)   Leukocytosis   CAP (community acquired pneumonia)   ILD (interstitial lung disease) ([place])   Aspiration pneumonia of both upper lobes (HCC)   Protein-calorie malnutrition, severe  1-Pneumonia/sepsis: Sepsis present on admission, ruled in Patient was initially started on vancomycin and cefepime which was stopped on 10/30. High-resolution CT scan of the chest showed bronchopneumonia, ID started him back on Augmentin to complete 7 more days. He completed Augmentin on 11/7 WBC normalized.   2-Acute respiratory failure with hypoxemia due to Acute on Chronic Systolic Heart Failure: Last echo July 2022  ejection fraction 20% Continue with  ivabradine, digoxin, midodrine and milrinone. Heart failure team following and managing Underwent cath 11/16, Milrirone increase  to 0.375. Not a transplant candidate per [name]. Plan  for LVAD is 11/25. Continue with Lasix  80 mg IV BID> received a dose of Metolazone 11/18. 4 L urine out put yesterday.    Dyslipidemia;  -Continue with atorvastatin.  Right upper extremity DVT with neuropathy: Continue with apixaban and gabapentin Will need to be transition to heparin close to date for surgery   Depression: Continue with sertraline  BPH: Continue with Flomax Protein caloric malnutrition: Continue with Ensure, calorie count.  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   Mild hyponatremia in the setting of heart failure  A. fib with RVR: He was a started on amiodarone infusion. With apixaban  Hypokalemia: He has 40 meq TID ordered.  Replete magnesium. Level 1.8   Anemia: Iron deficiency: Received IV iron x2 Hemoglobin stable 8.1 B12 level low normal range. Started  B12 supplement.   Severe Protein caloric malnutrition.  On supplement.  Follow by nutritionist.    Nutrition Problem: Severe Malnutrition Etiology: chronic illness (CHF)    Signs/Symptoms: severe muscle depletion, severe fat depletion    Interventions: MVI, Prostat, Boost Breeze  Estimated body mass index is 23.29 kg/m as calculated from the following:   Height as of this encounter: 6' (1.829 m).   Weight as of this encounter: 77.9 kg.   DVT prophylaxis: Eliquis Code Status: Full code Family Communication: sister who was at bedside 11/16. Disposition Plan:  Status is: Inpatient  Remains inpatient appropriate because: Admitted with heart failure exacerbation, pneumonia.        Consultants:  Heart failure team  Procedures:  Right side cardiac cath 11/16  Antimicrobials:     Subjective: He is alert, report he was able to sleep on his bed last night. Breathing much improved.  Report dry mouth. Will order Biotine.    Objective: Vitals:   03/30/21 1922 03/30/21 2329 03/31/21 0354 03/31/21 0744  BP:  100/69 103/73 102/78  Pulse:  98 100 97  Resp:  20 19 18   Temp: 97.9 F (36.6 C) 97.9 F (36.6 C) 97.9 F (36.6 C) (!) 97.5 F (36.4 C)  TempSrc: Oral Oral Oral Oral  SpO2:  95% 93% 99%  Weight:   77.9 kg   Height:        Intake/Output Summary (Last 24 hours) at 03/31/2021 0813 Last data filed at 03/31/2021 0429 Gross per 24 hour  Intake 2899.48 ml  Output 4650 ml  Net -1750.52 ml    Filed Weights   03/29/21 0500 03/30/21 0322 03/31/21 0354  Weight: 78 kg 78.8 kg 77.9 kg    Examination:  General exam: NAD Respiratory system: CTA Cardiovascular system: S 1, S 2 RRR Gastrointestinal system: BS present, soft, nt Central nervous system: Alert, follows command Extremities:  trace edema   Data Reviewed: I have personally reviewed following labs and imaging studies  CBC: Recent Labs  Lab 03/27/21 0400 03/28/21 0335 03/28/21 0909 03/29/21 0600 03/30/21 0510 03/31/21 0400  WBC 10.6* 12.0*  --  11.3* 9.7 10.1  HGB 8.4* 8.4* 9.2*  9.5* 8.1* 8.3* 8.2*  HCT 27.6* 26.9* 27.0*  28.0* 27.1* 27.8* 27.1*  MCV 79.8* 79.6*  --  80.9 82.0 80.9  PLT 318 343  --  340 340 884    Basic Metabolic Panel: Recent Labs  Lab 03/27/21 0400 03/28/21 0335 03/28/21 0909 03/29/21 0600 03/30/21 0510 03/31/21 0400  NA 130* 132* 135  134* 132* 133* 133*  K 4.1 4.1 3.9  4.0 3.7 3.9 3.4*  CL 91* 93*  --  91* 94* 91*  CO2 31 31  --  33* 33* 34*  GLUCOSE 175* 118*  --  127* 153* 100*  BUN 24* 26*  --  26* 25* 22  CREATININE 0.60* 0.65  --  0.73 0.70 0.71  CALCIUM 8.9 9.0  --  9.1 9.1 8.9  MG 2.1 1.9  --  2.1 2.0 1.8    GFR: Estimated Creatinine Clearance: 98.3 mL/min (by C-G formula based on SCr of 0.71 mg/dL). Liver Function Tests: No  results for input(s): AST, ALT, ALKPHOS, BILITOT, PROT, ALBUMIN in the last 168 hours. No results for input(s): LIPASE, AMYLASE in the last 168 hours. No results for input(s): AMMONIA in the last 168 hours. Coagulation Profile: No results for input(s): INR, PROTIME in the last 168 hours. Cardiac Enzymes: No results for input(s): CKTOTAL, CKMB, CKMBINDEX, TROPONINI in the last 168 hours. BNP (last 3 results) No results for input(s): PROBNP in the last 8760 hours. HbA1C: No results for input(s): HGBA1C in the last 72 hours. CBG: No results for input(s): GLUCAP in the last 168 hours. Lipid Profile: No results for input(s): CHOL, HDL, LDLCALC, TRIG, CHOLHDL, LDLDIRECT in the last 72 hours. Thyroid Function Tests: No results for input(s): TSH, T4TOTAL, FREET4, T3FREE, THYROIDAB in the last 72 hours. Anemia Panel: Recent Labs    03/29/21 0600  VITAMINB12 280    Sepsis Labs: No results for input(s): PROCALCITON, LATICACIDVEN in the  last 168 hours.  No results found for this or any previous visit (from the past 240 hour(s)).       Radiology Studies: No results found.      Scheduled Meds:  (feeding supplement) PROSource Plus  30 mL Oral QID   apixaban  5 mg Oral BID   atorvastatin  80 mg Oral QPM   Chlorhexidine Gluconate Cloth  6 each Topical Daily   clonazepam  0.25 mg Oral BID   digoxin  0.125 mg Oral Daily   diphenhydrAMINE  50 mg Oral QHS   ezetimibe  10 mg Oral Daily   feeding supplement  237 mL Oral TID BM   folic acid  1 mg Oral Daily   furosemide  80 mg Intravenous BID   gabapentin  200 mg Oral TID   lidocaine  1 patch Transdermal Q24H   mouth rinse  15 mL Mouth Rinse BID   melatonin  5 mg Oral QHS   midodrine  10 mg Oral TID WC   multivitamin with minerals  1 tablet Oral Daily   pantoprazole  40 mg Oral Daily   polyethylene glycol  17 g Oral Daily   polyvinyl alcohol  1 drop Both Eyes BID   potassium chloride  40 mEq Oral TID   senna  2 tablet Oral BID    sertraline  100 mg Oral Daily   sodium chloride flush  10-40 mL Intracatheter Q12H   sodium chloride flush  3 mL Intravenous Q12H   sodium chloride flush  3 mL Intravenous Q12H   tamsulosin  0.4 mg Oral Daily   vitamin B-12  100 mcg Oral Daily   Continuous Infusions:  sodium chloride     amiodarone 30 mg/hr (03/31/21 0400)   milrinone 0.375 mcg/kg/min (03/31/21 0400)     LOS: 23 days    Time spent: 35 Minutes.     Elmarie Shiley, MD [facility]   If 7PM-7AM, please contact night-coverage [email or web address]  03/31/2021, 8:13 AM

## 2021-04-01 LAB — BASIC METABOLIC PANEL WITH GFR
Anion gap: 10 (ref 5–15)
BUN: 21 mg/dL (ref 8–23)
CO2: 31 mmol/L (ref 22–32)
Calcium: 8.7 mg/dL — ABNORMAL LOW (ref 8.9–10.3)
Chloride: 89 mmol/L — ABNORMAL LOW (ref 98–111)
Creatinine, Ser: 0.74 mg/dL (ref 0.61–1.24)
GFR, Estimated: >60 mL/min
Glucose, Bld: 241 mg/dL — ABNORMAL HIGH (ref 70–99)
Potassium: 2.8 mmol/L — ABNORMAL LOW (ref 3.5–5.1)
Sodium: 130 mmol/L — ABNORMAL LOW (ref 135–145)

## 2021-04-01 LAB — BASIC METABOLIC PANEL
Anion gap: 9 (ref 5–15)
BUN: 23 mg/dL (ref 8–23)
CO2: 33 mmol/L — ABNORMAL HIGH (ref 22–32)
Calcium: 8.7 mg/dL — ABNORMAL LOW (ref 8.9–10.3)
Chloride: 91 mmol/L — ABNORMAL LOW (ref 98–111)
Creatinine, Ser: 0.8 mg/dL (ref 0.61–1.24)
GFR, Estimated: >60 mL/min (ref >60)
Glucose, Bld: 100 mg/dL — ABNORMAL HIGH (ref 70–99)
Potassium: 2.5 mmol/L — CL (ref 3.5–5.1)
Sodium: 133 mmol/L — ABNORMAL LOW (ref 135–145)

## 2021-04-01 LAB — CBC
HCT: 28.4 % — ABNORMAL LOW (ref 39.0–52.0)
Hemoglobin: 8.6 g/dL — ABNORMAL LOW (ref 13.0–17.0)
MCH: 24.6 pg — ABNORMAL LOW (ref 26.0–34.0)
MCHC: 30.3 g/dL (ref 30.0–36.0)
MCV: 81.1 fL (ref 80.0–100.0)
Platelets: 344 10*3/uL (ref 150–400)
RBC: 3.5 MIL/uL — ABNORMAL LOW (ref 4.22–5.81)
RDW: 20.2 % — ABNORMAL HIGH (ref 11.5–15.5)
WBC: 10.1 10*3/uL (ref 4.0–10.5)
nRBC: 0 % (ref 0.0–0.2)

## 2021-04-01 LAB — COOXEMETRY PANEL
Carboxyhemoglobin: 1.8 % — ABNORMAL HIGH (ref 0.5–1.5)
Methemoglobin: 0.7 % (ref 0.0–1.5)
O2 Saturation: 66.9 %
Total hemoglobin: 8.3 g/dL — ABNORMAL LOW (ref 12.0–16.0)

## 2021-04-01 LAB — PHOSPHORUS: Phosphorus: 4 mg/dL (ref 2.5–4.6)

## 2021-04-01 LAB — MAGNESIUM: Magnesium: 2.1 mg/dL (ref 1.7–2.4)

## 2021-04-01 LAB — POTASSIUM: Potassium: 2.8 mmol/L — ABNORMAL LOW (ref 3.5–5.1)

## 2021-04-01 MED ORDER — POTASSIUM CHLORIDE CRYS ER 20 MEQ PO TBCR
40.0000 meq | EXTENDED_RELEASE_TABLET | Freq: Once | ORAL | Status: AC
  Administered 2021-04-01: 40 meq via ORAL
  Filled 2021-04-01: qty 2

## 2021-04-01 MED ORDER — POTASSIUM CHLORIDE 10 MEQ/100ML IV SOLN
10.0000 meq | INTRAVENOUS | Status: AC
  Administered 2021-04-01 (×2): 10 meq via INTRAVENOUS
  Filled 2021-04-01 (×2): qty 100

## 2021-04-01 MED ORDER — HEPARIN (PORCINE) 25000 UT/250ML-% IV SOLN
2000.0000 [IU]/h | INTRAVENOUS | Status: DC
  Administered 2021-04-01: 1200 [IU]/h via INTRAVENOUS
  Administered 2021-04-02: 1400 [IU]/h via INTRAVENOUS
  Administered 2021-04-03: 1800 [IU]/h via INTRAVENOUS
  Administered 2021-04-03: 1650 [IU]/h via INTRAVENOUS
  Administered 2021-04-04: 1900 [IU]/h via INTRAVENOUS
  Administered 2021-04-05: 2000 [IU]/h via INTRAVENOUS
  Administered 2021-04-05: 1900 [IU]/h via INTRAVENOUS
  Filled 2021-04-01 (×7): qty 250

## 2021-04-01 MED ORDER — POTASSIUM CHLORIDE 10 MEQ/50ML IV SOLN: 10.0000 meq | INTRAVENOUS | Status: DC

## 2021-04-01 MED ORDER — POTASSIUM CHLORIDE 10 MEQ/100ML IV SOLN: 10.0000 meq | INTRAVENOUS | Status: DC

## 2021-04-01 MED ORDER — POTASSIUM CHLORIDE CRYS ER 20 MEQ PO TBCR: 40.0000 meq | EXTENDED_RELEASE_TABLET | ORAL | Status: DC

## 2021-04-01 MED ORDER — POTASSIUM CHLORIDE CRYS ER 20 MEQ PO TBCR: 40.0000 meq | EXTENDED_RELEASE_TABLET | Freq: Once | ORAL | Status: DC

## 2021-04-01 MED ORDER — POTASSIUM CHLORIDE CRYS ER 20 MEQ PO TBCR
60.0000 meq | EXTENDED_RELEASE_TABLET | Freq: Three times a day (TID) | ORAL | Status: DC
  Administered 2021-04-01 – 2021-04-04 (×10): 60 meq via ORAL
  Filled 2021-04-01 (×2): qty 3
  Filled 2021-04-01: qty 6
  Filled 2021-04-01 (×7): qty 3

## 2021-04-01 NOTE — Progress Notes (Signed)
[place] for heparin Indication: atrial fibrillation  No Active Allergies  Patient Measurements: Height: 6' (182.9 cm) Weight: 77.1 kg (169 lb 15.6 oz) IBW/kg (Calculated) : 77.6 Heparin Dosing Weight: 77kg  Vital Signs: Temp: 97.5 F (36.4 C) (11/20 0742) Temp Source: Oral (11/20 0742) BP: 101/61 (11/20 0742) Pulse Rate: 95 (11/20 1015)  Labs: Recent Labs    03/30/21 0510 03/31/21 0400 04/01/21 0405 04/01/21 0544  HGB 8.3* 8.2* 8.6*  --   HCT 27.8* 27.1* 28.4*  --   PLT 340 326 344  --   CREATININE 0.70 0.71  --  0.80    Estimated Creatinine Clearance: 97.7 mL/min (by C-G formula based on SCr of 0.8 mg/dL).   Medical History: Past Medical History:  Diagnosis Date   AICD (automatic cardioverter/defibrillator) present 08/31/2019   Ankylosing spondylitis ([place])    Arthritis    BENIGN PROSTATIC HYPERTROPHY 06/07/2008   CHF (congestive heart failure) ([place])    COLONIC POLYPS, HX OF 06/07/2008   Depression    H/O hiatal hernia    Heart failure ([place])    HYPERLIPIDEMIA 06/07/2008   HYPERTENSION 06/07/2008   Myocardial infarction ([place]) 2005   NSTEMI, s/p LAD stent   NEPHROLITHIASIS, HX OF 06/07/2008   STEMI (ST elevation myocardial infarction) ([place]) 04/27/2019     Assessment: 33 yoM with end stage HFrEF with planned LVAD implant on 11/25. Pt is on apixaban for hx DVT and AFib, now to transition to IV heparin in anticipation of surgery.  Last dose of apixaban was this morning at 1016, CBC stable - will utilize aPTTs along with heparin levels.  Goal of Therapy:  Heparin level 0.3-0.7 units/ml aPTT 66-102 seconds Monitor platelets by anticoagulation protocol: Yes   Plan:  Stop apixaban Heparin 1200 units/h no bolus tonight at 2200 Check 8h aPTT and heparin level  Arrie Senate, PharmD, BCPS, [facility] Clinical Pharmacist [phone] Please check AMION for all [place] numbers 04/01/2021

## 2021-04-01 NOTE — Progress Notes (Addendum)
HOSPITAL MEDICINE OVERNIGHT EVENT NOTE    Patient exhibiting critical hypokalemia this morning of 2.5.  This particularly surprising considering patient has been on a significant amount of potassium chloride supplementation throughout the hospitalization.  Therefore, this a dramatic drop of potassium leads me to question the accuracy of the test.  Before providing the patient with dangerous amounts of high amounts of potassium chloride we will perform a repeat stat serum potassium.  If severe hypokalemia is confirmed we will then place orders for repletion.   Vernelle Emerald  MD [facility]

## 2021-04-01 NOTE — Progress Notes (Signed)
PROGRESS NOTE    Brandon [name]  CWC:[id number] DOB: 1955/10/14 DOA: 03/07/2021 PCP: [name], MD   Brief Narrative: 65 year old with past medical history significant for CAD, chronic systolic heart failure with AICD extracted 7/20-second/2022 due to infection, completed antibiotics on 01/15/2021 with Rocephin, he has moderate aortic stenosis, right upper extremity DVT presents to the ED with acute onset of dyspnea.  He was found to be hypoxic placed on 2 L of oxygen found to be in acute decompensated heart failure.  Cardiology consulted.  Significant events: 10/29 Co-ox 48%, started on milrinone 0.25.  10/30 IV Antibiotics stopped. Blood cultures no growth for 5 days.  10/31 started on augmentin. Milrinone cut back to 0.125 mcg.  03/12/2021 started on midodrine  03/24/2021 started back on milrinone   Assessment & Plan:   Principal Problem:   Acute on chronic respiratory failure with hypoxia (HCC) Active Problems:   Hyperlipidemia with target LDL less than 70   Essential hypertension   CAD (coronary artery disease)   Ischemic cardiomyopathy   Centrilobular emphysema (HCC)   Normocytic anemia   Respiratory failure (HCC)   Bacteremia   Systemic inflammatory response syndrome (SIRS) (HCC)   Chronic systolic CHF (congestive heart failure) (HCC)   Ankylosing spondylitis (HCC)   Leukocytosis   CAP (community acquired pneumonia)   ILD (interstitial lung disease) ([place])   Aspiration pneumonia of both upper lobes (HCC)   Protein-calorie malnutrition, severe  1-Pneumonia/sepsis: Sepsis present on admission, ruled in. Patient was initially started on vancomycin and cefepime which was stopped on 10/30. High-resolution CT scan of the chest showed bronchopneumonia, ID started him back on Augmentin to complete 7 more days. He completed Augmentin on 11/7. WBC normalized.   2-Acute respiratory failure with hypoxemia due to Acute on Chronic Systolic Heart Failure: -Last ECHO July  2022 ejection fraction 20% Continue with  ivabradine, digoxin, midodrine and milrinone. Heart failure team following and managing. Underwent cath 11/16, Milrirone increase  to 0.375. Not a transplant candidate per [name]. Plan  for LVAD is 11/25. Continue with Lasix  80 mg IV BID> received a dose of Metolazone 11/18.  5 ZL urine out put yesterday.  On Diamox for 4 doses.   Hypokalemia: Held lasix initially this am. He received 10 meq IV times 2 run.  Received extra dose 40 meq this am orally.  On KCL 40 meq TID> ---increase to 60 meq TID>    Dyslipidemia;  -Continue with atorvastatin.  Right upper extremity DVT with neuropathy: Continue with apixaban and gabapentin Will need to be transition to heparin close to date for surgery   Depression: Continue with sertraline  BPH: Continue with Flomax Protein caloric malnutrition: Continue with Ensure, calorie count.  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   Mild hyponatremia in the setting of heart failure  A. fib with RVR: He was a started on amiodarone infusion. With apixaban  Hypokalemia: He has 40 meq TID ordered.  Replete magnesium. Level 1.8   Anemia: Iron deficiency: Received IV iron x2 Hemoglobin stable 8.1 B12 level low normal range. Started  B12 supplement.   Severe Protein caloric malnutrition.  On supplement.  Follow by nutritionist.    Nutrition Problem: Severe Malnutrition Etiology: chronic illness (CHF)    Signs/Symptoms: severe muscle depletion, severe fat depletion    Interventions: MVI, Prostat, Boost Breeze  Estimated body mass index is 23.05 kg/m as calculated from the following:  Height as of this encounter: 6' (1.829 m).   Weight as of this encounter: 77.1 kg.   DVT prophylaxis: Eliquis Code Status: Full code Family Communication: sister who was at bedside 11/16. Disposition Plan:  Status is:  Inpatient  Remains inpatient appropriate because: Admitted with heart failure exacerbation, pneumonia.        Consultants:  Heart failure team  Procedures:  Right side cardiac cath 11/16  Antimicrobials:    Subjective: He is sleepy, wake up answer questions feels tired, weak, this am. Likely related to hypokalemia.    Objective: Vitals:   03/31/21 2015 03/31/21 2233 04/01/21 0410 04/01/21 0500  BP: 92/62 113/76 99/69   Pulse: 91 95 100   Resp: 20 20 20    Temp: 97.8 F (36.6 C) 98 F (36.7 C) 97.7 F (36.5 C) (!) 97.5 F (36.4 C)  TempSrc: Oral Oral Oral   SpO2: 98% 97% 93%   Weight:    77.1 kg  Height:        Intake/Output Summary (Last 24 hours) at 04/01/2021 0715 Last data filed at 04/01/2021 0413 Gross per 24 hour  Intake 1211.37 ml  Output 5125 ml  Net -3913.63 ml    Filed Weights   03/30/21 0322 03/31/21 0354 04/01/21 0500  Weight: 78.8 kg 77.9 kg 77.1 kg    Examination:  General exam:  NAD Respiratory system: CTA Cardiovascular system: S 1, S 2 RRR Gastrointestinal system: BS present, soft, nt Central nervous system: alert, follows command Extremities: Trace edema   Data Reviewed: I have personally reviewed following labs and imaging studies  CBC: Recent Labs  Lab 03/28/21 0335 03/28/21 0909 03/29/21 0600 03/30/21 0510 03/31/21 0400 04/01/21 0405  WBC 12.0*  --  11.3* 9.7 10.1 10.1  HGB 8.4* 9.2*  9.5* 8.1* 8.3* 8.2* 8.6*  HCT 26.9* 27.0*  28.0* 27.1* 27.8* 27.1* 28.4*  MCV 79.6*  --  80.9 82.0 80.9 81.1  PLT 343  --  340 340 326 694    Basic Metabolic Panel: Recent Labs  Lab 03/28/21 0335 03/28/21 0909 03/29/21 0600 03/30/21 0510 03/31/21 0400 04/01/21 0544  NA 132* 135  134* 132* 133* 133* 133*  K 4.1 3.9  4.0 3.7 3.9 3.4* 2.5*  CL 93*  --  91* 94* 91* 91*  CO2 31  --  33* 33* 34* 33*  GLUCOSE 118*  --  127* 153* 100* 100*  BUN 26*  --  26* 25* 22 23  CREATININE 0.65  --  0.73 0.70 0.71 0.80  CALCIUM 9.0  --   9.1 9.1 8.9 8.7*  MG 1.9  --  2.1 2.0 1.8 2.1    GFR: Estimated Creatinine Clearance: 97.7 mL/min (by C-G formula based on SCr of 0.8 mg/dL). Liver Function Tests: No results for input(s): AST, ALT, ALKPHOS, BILITOT, PROT, ALBUMIN in the last 168 hours. No results for input(s): LIPASE, AMYLASE in the last 168 hours. No results for input(s): AMMONIA in the last 168 hours. Coagulation Profile: No results for input(s): INR, PROTIME in the last 168 hours. Cardiac Enzymes: No results for input(s): CKTOTAL, CKMB, CKMBINDEX, TROPONINI in the last 168 hours. BNP (last 3 results) No results for input(s): PROBNP in the last 8760 hours. HbA1C: No results for input(s): HGBA1C in the last 72 hours. CBG: No results for input(s): GLUCAP in the last 168 hours. Lipid Profile: No results for input(s): CHOL, HDL, LDLCALC, TRIG, CHOLHDL, LDLDIRECT in the last 72 hours. Thyroid Function Tests: No results for input(s): TSH, T4TOTAL,  FREET4, T3FREE, THYROIDAB in the last 72 hours. Anemia Panel: No results for input(s): VITAMINB12, FOLATE, FERRITIN, TIBC, IRON, RETICCTPCT in the last 72 hours.  Sepsis Labs: No results for input(s): PROCALCITON, LATICACIDVEN in the last 168 hours.  No results found for this or any previous visit (from the past 240 hour(s)).       Radiology Studies: No results found.      Scheduled Meds:  (feeding supplement) PROSource Plus  30 mL Oral QID   acetaZOLAMIDE  250 mg Oral BID   apixaban  5 mg Oral BID   atorvastatin  80 mg Oral QPM   Chlorhexidine Gluconate Cloth  6 each Topical Daily   clonazepam  0.25 mg Oral BID   digoxin  0.125 mg Oral Daily   diphenhydrAMINE  50 mg Oral QHS   ezetimibe  10 mg Oral Daily   feeding supplement  237 mL Oral TID BM   folic acid  1 mg Oral Daily   furosemide  80 mg Intravenous BID   gabapentin  200 mg Oral TID   lidocaine  1 patch Transdermal Q24H   mouth rinse  15 mL Mouth Rinse BID   melatonin  5 mg Oral QHS    midodrine  10 mg Oral TID WC   multivitamin with minerals  1 tablet Oral Daily   pantoprazole  40 mg Oral Daily   polyethylene glycol  17 g Oral Daily   polyvinyl alcohol  1 drop Both Eyes BID   potassium chloride  40 mEq Oral TID   senna  2 tablet Oral BID   sertraline  100 mg Oral Daily   sodium chloride flush  10-40 mL Intracatheter Q12H   sodium chloride flush  3 mL Intravenous Q12H   sodium chloride flush  3 mL Intravenous Q12H   tamsulosin  0.4 mg Oral Daily   vitamin B-12  100 mcg Oral Daily   Continuous Infusions:  sodium chloride     amiodarone 30 mg/hr (04/01/21 0000)   milrinone 0.375 mcg/kg/min (04/01/21 0522)   potassium chloride       LOS: 24 days    Time spent: 35 Minutes.     Elmarie Shiley, MD [facility]   If 7PM-7AM, please contact night-coverage [email or web address]  04/01/2021, 7:15 AM

## 2021-04-01 NOTE — Plan of Care (Signed)
  Problem: Clinical Measurements: Goal: Ability to maintain clinical measurements within normal limits will improve Outcome: Not Progressing Goal: Diagnostic test results will improve Outcome: Not Progressing   

## 2021-04-01 NOTE — Progress Notes (Signed)
Mobility Specialist: Progress Note   04/01/21 1110  Mobility  Activity Ambulated in hall  Level of Assistance Modified independent, requires aide device or extra time  Assistive Device  (IV Pole)  Distance Ambulated (ft) 510 ft  Mobility Ambulated with assistance in hallway  Mobility Response Tolerated well  Mobility performed by Mobility specialist  Bed Position Chair  $Mobility charge 1 Mobility   Post-Mobility: 97 HR, 97% SpO2  Pt had no c/o during ambulation. Pt to recliner after walk with call bell at his side. Will f/u later today for second bout of ambulation.   [facility] Carla Rashad Mobility Specialist Mobility Specialist Phone #1: [phone] Mobility Specialist Phone #2: [phone]

## 2021-04-01 NOTE — Progress Notes (Signed)
Patient ID: Brandon [name], male   DOB: December 16, 1955, 65 y.o.   MRN: [id number]     Advanced Heart Failure Rounding Note  PCP-Cardiologist: [name], MD   Subjective:    Admitted with sepsis. Started on cefepime + vanc, now off abx (ID following). Afebrile.  10/27 started on ivabradine 7.5 mg twice a day and midodrine 5 mg TID 10/29 Co-ox 48%, started on milrinone 0.25.  10/30 IV Antibiotics stopped. Blood cultures no growth for 5 days.  10/31 started on Augmentin for PNA.  11/1 Milrinone off 11/2 Milrinone added back. Midodrine increased to 10 mg TID 11/8 Placed on 80 mg po lasix  11/10 Developed Afib w/ RVR, converted with IV amio 11/13 Give 250 cc NS bolus for low CVP.  11/14 Milrinone increased to 0.25 mcg. Diuresed with IV lasix  11/16 Had RHC with elevated left sided filling pressures PCWP 28, PA sat 57%, and normal cardiac output. Milrinone was increased to 0.375 mcg. Continued to diurese with IV lasix. Negative 2 liters.   Remains on milrinone 0.375 mcg --> CO-OX 67%. On lasix 80 IV bid. Diuresing well with another 5L out. Weight down 2 pounds. K down to 2.8. CVP 4   Breathing better. No orthopnea or PND.    Objective:   Weight Range: 77.1 kg Body mass index is 23.05 kg/m.   Vital Signs:   Temp:  [97.5 F (36.4 C)-98 F (36.7 C)] 97.5 F (36.4 C) (11/20 0742) Pulse Rate:  [90-100] 95 (11/20 1015) Resp:  [17-20] 18 (11/20 0742) BP: (92-113)/(61-76) 101/61 (11/20 0742) SpO2:  [93 %-98 %] 98 % (11/20 0742) Weight:  [77.1 kg] 77.1 kg (11/20 0500) Last BM Date: 03/31/21  Weight change: Filed Weights   03/30/21 0322 03/31/21 0354 04/01/21 0500  Weight: 78.8 kg 77.9 kg 77.1 kg    Intake/Output:   Intake/Output Summary (Last 24 hours) at 04/01/2021 1042 Last data filed at 04/01/2021 0800 Gross per 24 hour  Intake 1211.37 ml  Output 5175 ml  Net -3963.63 ml      Physical Exam   General:  Sitting in chair  No resp difficulty HEENT: normal Neck: supple.  no JVD. Carotids 2+ bilat; no bruits. No lymphadenopathy or thryomegaly appreciated. Cor: PMI nondisplaced. Regular rate & rhythm. 2/6 AS Lungs: clear Abdomen: soft, nontender, nondistended. No hepatosplenomegaly. No bruits or masses. Good bowel sounds. Extremities: no cyanosis, clubbing, rash, edema Neuro: alert & orientedx3, cranial nerves grossly intact. moves all 4 extremities w/o difficulty. Affect pleasant    Telemetry   Sinus 90-100 Personally reviewed   Labs    CBC Recent Labs    03/31/21 0400 04/01/21 0405  WBC 10.1 10.1  HGB 8.2* 8.6*  HCT 27.1* 28.4*  MCV 80.9 81.1  PLT 326 254    Basic Metabolic Panel Recent Labs    03/31/21 0400 04/01/21 0544 04/01/21 0656  NA 133* 133*  --   K 3.4* 2.5* 2.8*  CL 91* 91*  --   CO2 34* 33*  --   GLUCOSE 100* 100*  --   BUN 22 23  --   CREATININE 0.71 0.80  --   CALCIUM 8.9 8.7*  --   MG 1.8 2.1  --     Liver Function Tests No results for input(s): AST, ALT, ALKPHOS, BILITOT, PROT, ALBUMIN in the last 72 hours.  No results for input(s): LIPASE, AMYLASE in the last 72 hours. Cardiac Enzymes No results for input(s): CKTOTAL, CKMB, CKMBINDEX, TROPONINI in the last 72  hours.  BNP: BNP (last 3 results) Recent Labs    11/30/20 0144 01/08/21 0731 01/18/21 1629  BNP 2,653.1* 734.2* 607.9*     ProBNP (last 3 results) No results for input(s): PROBNP in the last 8760 hours.    D-Dimer No results for input(s): DDIMER in the last 72 hours. Hemoglobin A1C No results for input(s): HGBA1C in the last 72 hours. Fasting Lipid Panel No results for input(s): CHOL, HDL, LDLCALC, TRIG, CHOLHDL, LDLDIRECT in the last 72 hours. Thyroid Function Tests No results for input(s): TSH, T4TOTAL, T3FREE, THYROIDAB in the last 72 hours.  Invalid input(s): FREET3  Other results:   Imaging    No results found.   Medications:     Scheduled Medications:  (feeding supplement) PROSource Plus  30 mL Oral QID    acetaZOLAMIDE  250 mg Oral BID   apixaban  5 mg Oral BID   atorvastatin  80 mg Oral QPM   Chlorhexidine Gluconate Cloth  6 each Topical Daily   clonazepam  0.25 mg Oral BID   digoxin  0.125 mg Oral Daily   diphenhydrAMINE  50 mg Oral QHS   ezetimibe  10 mg Oral Daily   feeding supplement  237 mL Oral TID BM   folic acid  1 mg Oral Daily   furosemide  80 mg Intravenous BID   gabapentin  200 mg Oral TID   lidocaine  1 patch Transdermal Q24H   mouth rinse  15 mL Mouth Rinse BID   melatonin  5 mg Oral QHS   midodrine  10 mg Oral TID WC   multivitamin with minerals  1 tablet Oral Daily   pantoprazole  40 mg Oral Daily   polyethylene glycol  17 g Oral Daily   polyvinyl alcohol  1 drop Both Eyes BID   potassium chloride  40 mEq Oral Once   potassium chloride  60 mEq Oral TID   senna  2 tablet Oral BID   sertraline  100 mg Oral Daily   sodium chloride flush  10-40 mL Intracatheter Q12H   sodium chloride flush  3 mL Intravenous Q12H   sodium chloride flush  3 mL Intravenous Q12H   tamsulosin  0.4 mg Oral Daily   vitamin B-12  100 mcg Oral Daily    Infusions:  sodium chloride     amiodarone 30 mg/hr (04/01/21 0000)   milrinone 0.375 mcg/kg/min (04/01/21 0522)   potassium chloride 10 mEq (04/01/21 1023)     PRN Medications: sodium chloride, acetaminophen **OR** acetaminophen, antiseptic oral rinse, docusate sodium, guaiFENesin, ondansetron **OR** ondansetron (ZOFRAN) IV, sodium chloride flush, sodium chloride flush    Patient Profile   [name] is a 65 year old with history of smoker, CAD s/p previous MI, HTN, HL, chronic HFeEF, sepsis 11/2020 bacteremia.  Admitted with sepsis. Bld Cx - NGTD    Assessment/Plan    1. Acute on chronic systolic HFr EF - Echo 08/12/94 EF 20-25% RV mildly HK. moderate AS  Mean gradient 13 AVA 1.2 cm2 DI 0.30 - RHC (7/22): EF 20%, low filling pressures. CI 2.3.  - RHC 11/16  RA 2, PA 65/27 (44), PCW 28, SVR 960, CO 6.5,  CI 3.2 , PA 57%.  Milrinone was increased to 0.375 mcg.  - Todays CO-OX 59 %. Continue milrinone 0.375 mcg. - CVP 4 Continue  IV lasix 80mg  bid on more day. Supp K aggressively . Bicarb climbing. Continue diamox x 4 doses.  -- No bb with low output HF -  No ivabradine given AF   - Continue digoxin. Dig level ok - Continue midodrine 10 mg TID   - Discussed with [name] -  not a candidate for transplant.  - VAD with AVR planned for 11/15. Continue inotropic support, ambulation and aggressive nutrition interventions.   2. Sepsis - H/o strep bacteremia 11/2020.  ICD extracted 12/04/20. Barostim was not removed as it is extravascular.  - Received IV antibiotics until 01/15/21. Blood cultures negative 01/30/21..  - Procalcitonin 0.22->0.11 - 10/26 -Blood CX x2  Negative  - Ct chest concerning for pneumonia. Treated w/ cefepime/vancomycin -> Augmentin (stop 11/7) - Resolved  3. Acute Hypoxic Respiratory Failure  - CXR with pulmonary edema initially, and was diuresed with IV Lasix.  -  CT chest completed 10/30 concerning for bronchopneumonia. On augmentin.  - Improved/resolved.  - Continue with diuresis    4. CAD  s/p anterior STEMI (12/20). LHC showed chronically occluded RCA (with L>>R collaterals) and thrombotic occlusion of proximal LAD. Underwent PCI of LAD. - LHC (7/22) with stable CAD.  - No s/s angina  - Continue atorvastatin 80. No aspirin d/t anticoagulation.  5. 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 H./O RUE DVT - On eliquis. Will need to change to heparin as we get closer to VAD  7. Severe protein-calorie malnutrition - prealbumin 11>15> 13 despite adequate caloric intake on calorie count - Pre-albumin dropping.  - Dietitian following with supplements adjusted.  - repeat calorie count completed and was adequate  8. Iron-deficiency anemia - Hgb stable today at 8.6 - Rec'd Feraheme 11/8 and 11/13   9. Paroxysmal Atrial  Fibrillation w/ RVR - new, developed 11/10. Now back in NSR   - Maintaining NSR. Continue amio gtt 30/hr while on milrinone - Switch Eliquis to heparin today - Mag stable 2.1 K 2.8 K will supp  - Keep K > 4.0 and Mg >2.0   10. Hypokalemia - K 2.8 K supp increased   11. Hyponatremia - Sodium stable @ 133 today   - restrict free water  Length of Stay: [place], MD  04/01/2021, 10:42 AM  Advanced Heart Failure Team Pager [phone] (M-F; 7a - 5p)  Please contact [facility] for night-coverage after hours (5p -7a ) and weekends on [email or web address]

## 2021-04-01 NOTE — Progress Notes (Signed)
Mobility Specialist: Progress Note   04/01/21 1549  Mobility  Activity Ambulated in hall  Level of Assistance Modified independent, requires aide device or extra time  Assistive Device  (IV Pole)  Distance Ambulated (ft) 510 ft  Mobility Ambulated with assistance in hallway  Mobility Response Tolerated well  Mobility performed by Mobility specialist  Bed Position Chair  $Mobility charge 1 Mobility   Post-Mobility: 92 HR, 100% SpO2  Pt ambulated on 2 L/min [place], no c/o throughout. Pt to recliner after walk with call bell in reach.   [facility] Craig Ionescu Mobility Specialist Mobility Specialist Phone #1: [phone] Mobility Specialist Phone #2: [phone]

## 2021-04-02 ENCOUNTER — Telehealth (HOSPITAL_COMMUNITY): Payer: Self-pay | Admitting: Licensed Clinical Social Worker

## 2021-04-02 LAB — BASIC METABOLIC PANEL
Anion gap: 7 (ref 5–15)
Anion gap: 7 (ref 5–15)
BUN: 25 mg/dL — ABNORMAL HIGH (ref 8–23)
BUN: 24 mg/dL — ABNORMAL HIGH (ref 8–23)
CO2: 28 mmol/L (ref 22–32)
CO2: 28 mmol/L (ref 22–32)
Calcium: 8.8 mg/dL — ABNORMAL LOW (ref 8.9–10.3)
Calcium: 8.8 mg/dL — ABNORMAL LOW (ref 8.9–10.3)
Chloride: 96 mmol/L — ABNORMAL LOW (ref 98–111)
Chloride: 97 mmol/L — ABNORMAL LOW (ref 98–111)
Creatinine, Ser: 0.77 mg/dL (ref 0.61–1.24)
Creatinine, Ser: 0.75 mg/dL (ref 0.61–1.24)
GFR, Estimated: >60 mL/min (ref >60)
GFR, Estimated: >60 mL/min (ref >60)
Glucose, Bld: 127 mg/dL — ABNORMAL HIGH (ref 70–99)
Glucose, Bld: 104 mg/dL — ABNORMAL HIGH (ref 70–99)
Potassium: 3.8 mmol/L (ref 3.5–5.1)
Potassium: 3.6 mmol/L (ref 3.5–5.1)
Sodium: 131 mmol/L — ABNORMAL LOW (ref 135–145)
Sodium: 132 mmol/L — ABNORMAL LOW (ref 135–145)

## 2021-04-02 LAB — CBC
HCT: 27.3 % — ABNORMAL LOW (ref 39.0–52.0)
Hemoglobin: 8.1 g/dL — ABNORMAL LOW (ref 13.0–17.0)
MCH: 24.8 pg — ABNORMAL LOW (ref 26.0–34.0)
MCHC: 29.7 g/dL — ABNORMAL LOW (ref 30.0–36.0)
MCV: 83.7 fL (ref 80.0–100.0)
Platelets: 320 10*3/uL (ref 150–400)
RBC: 3.26 MIL/uL — ABNORMAL LOW (ref 4.22–5.81)
RDW: 20.2 % — ABNORMAL HIGH (ref 11.5–15.5)
WBC: 10 10*3/uL (ref 4.0–10.5)
nRBC: 0 % (ref 0.0–0.2)

## 2021-04-02 LAB — SURGICAL PCR SCREEN
MRSA, PCR: NEGATIVE
Staphylococcus aureus: NEGATIVE

## 2021-04-02 LAB — APTT
aPTT: 41 seconds — ABNORMAL HIGH (ref 24–36)
aPTT: 47 seconds — ABNORMAL HIGH (ref 24–36)

## 2021-04-02 LAB — COOXEMETRY PANEL
Carboxyhemoglobin: 1.8 % — ABNORMAL HIGH (ref 0.5–1.5)
Methemoglobin: 0.7 % (ref 0.0–1.5)
O2 Saturation: 53.7 %
Total hemoglobin: 8.9 g/dL — ABNORMAL LOW (ref 12.0–16.0)

## 2021-04-02 LAB — HEPARIN LEVEL (UNFRACTIONATED): Heparin Unfractionated: >1.1 IU/mL — ABNORMAL HIGH (ref 0.30–0.70)

## 2021-04-02 LAB — MAGNESIUM: Magnesium: 2.1 mg/dL (ref 1.7–2.4)

## 2021-04-02 MED ORDER — MUPIROCIN 2 % EX OINT: 1.0000 "application " | TOPICAL_OINTMENT | Freq: Two times a day (BID) | CUTANEOUS | Status: DC

## 2021-04-02 MED ORDER — CLONAZEPAM 0.25 MG PO TBDP
0.2500 mg | ORAL_TABLET | Freq: Once | ORAL | Status: AC
  Administered 2021-04-02: 0.25 mg via ORAL
  Filled 2021-04-02: qty 1

## 2021-04-02 NOTE — Progress Notes (Signed)
CARDIAC REHAB PHASE I   Pt wheeled outside for fresh air accompanied by family. Pt states readiness for surgery and is thankful to the team for the care he has received this far. Questions and concerns addressed. Pt demonstrating ~1250 on IS. Plans to complete 6 minute walk test tomorrow. Pt returned to recliner, call bell and bedside table within reach. Will continue to follow.  [id number] Rufina Falco, RN BSN 04/02/2021 3:16 PM

## 2021-04-02 NOTE — Progress Notes (Signed)
ANTICOAGULATION CONSULT NOTE  Pharmacy Consult for heparin Indication: atrial fibrillation  No Active Allergies  Patient Measurements: Height: 6' (182.9 cm) Weight: 77.1 kg (169 lb 15.6 oz) IBW/kg (Calculated) : 77.6 Heparin Dosing Weight: 77kg  Vital Signs: Temp: 97.6 F (36.4 C) (11/21 1043) Temp Source: Oral (11/21 1043) BP: 112/78 (11/21 1043) Pulse Rate: 92 (11/21 1043)  Labs: Recent Labs    03/31/21 0400 04/01/21 0405 04/01/21 0544 04/01/21 1402 04/01/21 2224 04/02/21 0430 04/02/21 0700  HGB 8.2* 8.6*  --   --   --  8.1*  --   HCT 27.1* 28.4*  --   --   --  27.3*  --   PLT 326 344  --   --   --  320  --   APTT  --   --   --   --   --   --  41*  HEPARINUNFRC  --   --   --   --   --   --  >1.10*  CREATININE 0.71  --    < > 0.74 0.77 0.75  --    < > = values in this interval not displayed.     Estimated Creatinine Clearance: 97.7 mL/min (by C-G formula based on SCr of 0.75 mg/dL).   Medical History: Past Medical History:  Diagnosis Date   AICD (automatic cardioverter/defibrillator) present 08/31/2019   Ankylosing spondylitis ([place])    Arthritis    BENIGN PROSTATIC HYPERTROPHY 06/07/2008   CHF (congestive heart failure) ([place])    COLONIC POLYPS, HX OF 06/07/2008   Depression    H/O hiatal hernia    Heart failure ([place])    HYPERLIPIDEMIA 06/07/2008   HYPERTENSION 06/07/2008   Myocardial infarction ([place]) 2005   NSTEMI, s/p LAD stent   NEPHROLITHIASIS, HX OF 06/07/2008   STEMI (ST elevation myocardial infarction) ([place]) 04/27/2019     Assessment: 60 yoM with end stage HFrEF with planned LVAD implant on 11/25. Pt is on apixaban for hx DVT and AFib, now to transition to IV heparin in anticipation of surgery.  Last dose of apixaban was 11/20 at 10am CBC stable - will utilize aPTTs until apixaban affect wears off - monitor heparin levels.  Heparin drip 1200 uts/hr aptt 45sec < goal   Goal of Therapy:  Heparin level 0.3-0.7 units/ml aPTT 66-102  seconds Monitor platelets by anticoagulation protocol: Yes   Plan:   Increase Heparin 1400 units/h  Check aptt in 6hr and then daily heparin level, aptt and cbc    Bonnita Nasuti Pharm.D. CPP, BCPS Clinical Pharmacist [phone] 04/02/2021 12:03 PM   Please check AMION for all [place] numbers 04/02/2021

## 2021-04-02 NOTE — Progress Notes (Signed)
Mobility Specialist Progress Note    04/02/21 1116  Mobility  Activity Ambulated in hall  Level of Assistance Standby assist, set-up cues, supervision of patient - no hands on  Assistive Device  (IV pole)  Distance Ambulated (ft) 510 ft  Mobility Ambulated independently in hallway  Mobility Response Tolerated well  Mobility performed by Mobility specialist  Bed Position Chair  $Mobility charge 1 Mobility   Pt received in chair and agreeable. No complaints on walk. Ambulated on 2LO2 with no breaks. Returned to chair with call bell in reach.   [facility] Mobility Specialist  M.S. Primary Phone: [phone] M.S. Secondary Phone: [phone]

## 2021-04-02 NOTE — Progress Notes (Addendum)
Patient ID: Brandon [name], male   DOB: March 12, 1956, 65 y.o.   MRN: [id number]     Advanced Heart Failure Rounding Note  PCP-Cardiologist: [name], MD   Subjective:    Admitted with sepsis. Started on cefepime + vanc, now off abx (ID following). Afebrile.  10/27 started on ivabradine 7.5 mg twice a day and midodrine 5 mg TID 10/29 Co-ox 48%, started on milrinone 0.25.  10/30 IV Antibiotics stopped. Blood cultures no growth for 5 days.  10/31 started on Augmentin for PNA.  11/1 Milrinone off 11/2 Milrinone added back. Midodrine increased to 10 mg TID 11/8 Placed on 80 mg po lasix  11/10 Developed Afib w/ RVR, converted with IV amio 11/13 Give 250 cc NS bolus for low CVP.  11/14 Milrinone increased to 0.25 mcg. Diuresed with IV lasix  11/16 Had RHC with elevated left sided filling pressures PCWP 28, PA sat 57%, and normal cardiac output. Milrinone was increased to 0.375 mcg. Continued to diurese with IV lasix.   Remains on milrinone 0.375 mcg --> CO-OX 54%   Feels ok. Denies SOB. Continues to walk twice a day.     Objective:   Weight Range: 77.1 kg Body mass index is 23.05 kg/m.   Vital Signs:   Temp:  [97.4 F (36.3 C)-98.2 F (36.8 C)] 97.8 F (36.6 C) (11/21 0748) Pulse Rate:  [93-99] 93 (11/21 0748) Resp:  [18-20] 19 (11/21 0748) BP: (95-101)/(65-71) 95/71 (11/21 0748) SpO2:  [94 %-100 %] 100 % (11/21 0748) Last BM Date: 04/01/21  Weight change: Filed Weights   03/30/21 0322 03/31/21 0354 04/01/21 0500  Weight: 78.8 kg 77.9 kg 77.1 kg    Intake/Output:   Intake/Output Summary (Last 24 hours) at 04/02/2021 0919 Last data filed at 04/02/2021 0758 Gross per 24 hour  Intake 941.85 ml  Output 2350 ml  Net -1408.15 ml     Physical Exam  CVP 3-4 personally checked.  General:  No resp difficulty HEENT: normal Neck: supple. no JVD. Carotids 2+ bilat; no bruits. No lymphadenopathy or thryomegaly appreciated. Cor: PMI nondisplaced. Regular rate & rhythm. No  rubs, gallops . 2/6 AS. Lungs: clear 2 liters [place]  Abdomen: soft, nontender, nondistended. No hepatosplenomegaly. No bruits or masses. Good bowel sounds. Extremities: no cyanosis, clubbing, rash, edema.LUE PICC Neuro: alert & orientedx3, cranial nerves grossly intact. moves all 4 extremities w/o difficulty. Affect pleasant 3, cranial nerves grossly intact. moves all 4 extremities w/o difficulty. Affect pleasant    Telemetry  SR 90s personally checked.    Labs    CBC Recent Labs    04/01/21 0405 04/02/21 0430  WBC 10.1 10.0  HGB 8.6* 8.1*  HCT 28.4* 27.3*  MCV 81.1 83.7  PLT 344 063   Basic Metabolic Panel Recent Labs    04/01/21 0544 04/01/21 0656 04/01/21 1402 04/01/21 2224 04/02/21 0430  NA 133*  --  130* 131* 132*  K 2.5*   < > 2.8* 3.8 3.6  CL 91*  --  89* 96* 97*  CO2 33*  --  31 28 28   GLUCOSE 100*  --  241* 127* 104*  BUN 23  --  21 25* 24*  CREATININE 0.80  --  0.74 0.77 0.75  CALCIUM 8.7*  --  8.7* 8.8* 8.8*  MG 2.1  --   --   --  2.1  PHOS  --   --  4.0  --   --    < > = values in this interval not  displayed.   Liver Function Tests No results for input(s): AST, ALT, ALKPHOS, BILITOT, PROT, ALBUMIN in the last 72 hours.  No results for input(s): LIPASE, AMYLASE in the last 72 hours. Cardiac Enzymes No results for input(s): CKTOTAL, CKMB, CKMBINDEX, TROPONINI in the last 72 hours.  BNP: BNP (last 3 results) Recent Labs    11/30/20 0144 01/08/21 0731 01/18/21 1629  BNP 2,653.1* 734.2* 607.9*    ProBNP (last 3 results) No results for input(s): PROBNP in the last 8760 hours.    D-Dimer No results for input(s): DDIMER in the last 72 hours. Hemoglobin A1C No results for input(s): HGBA1C in the last 72 hours. Fasting Lipid Panel No results for input(s): CHOL, HDL, LDLCALC, TRIG, CHOLHDL, LDLDIRECT in the last 72 hours. Thyroid Function Tests No results for input(s): TSH, T4TOTAL, T3FREE, THYROIDAB in the last 72 hours.  Invalid input(s):  FREET3  Other results:   Imaging    No results found.   Medications:     Scheduled Medications:  (feeding supplement) PROSource Plus  30 mL Oral QID   atorvastatin  80 mg Oral QPM   Chlorhexidine Gluconate Cloth  6 each Topical Daily   clonazepam  0.25 mg Oral BID   digoxin  0.125 mg Oral Daily   diphenhydrAMINE  50 mg Oral QHS   ezetimibe  10 mg Oral Daily   feeding supplement  237 mL Oral TID BM   folic acid  1 mg Oral Daily   furosemide  80 mg Intravenous BID   gabapentin  200 mg Oral TID   lidocaine  1 patch Transdermal Q24H   mouth rinse  15 mL Mouth Rinse BID   melatonin  5 mg Oral QHS   midodrine  10 mg Oral TID WC   multivitamin with minerals  1 tablet Oral Daily   mupirocin ointment  1 application Nasal BID   pantoprazole  40 mg Oral Daily   polyethylene glycol  17 g Oral Daily   polyvinyl alcohol  1 drop Both Eyes BID   potassium chloride  60 mEq Oral TID   senna  2 tablet Oral BID   sertraline  100 mg Oral Daily   sodium chloride flush  10-40 mL Intracatheter Q12H   sodium chloride flush  3 mL Intravenous Q12H   sodium chloride flush  3 mL Intravenous Q12H   tamsulosin  0.4 mg Oral Daily   vitamin B-12  100 mcg Oral Daily    Infusions:  sodium chloride     amiodarone 30 mg/hr (04/02/21 0300)   heparin 1,200 Units/hr (04/02/21 0300)   milrinone 0.375 mcg/kg/min (04/02/21 0324)     PRN Medications: sodium chloride, acetaminophen **OR** acetaminophen, antiseptic oral rinse, docusate sodium, guaiFENesin, ondansetron **OR** ondansetron (ZOFRAN) IV, sodium chloride flush, sodium chloride flush    Patient Profile   Mr Kruer is a 65 year old with history of smoker, CAD s/p previous MI, HTN, HL, chronic HFeEF, sepsis 11/2020 bacteremia.  Admitted with sepsis. Bld Cx - NGTD    Assessment/Plan    1. Acute on chronic systolic HFr EF - Echo 05/18/08 EF 20-25% RV mildly HK. moderate AS  Mean gradient 13 AVA 1.2 cm2 DI 0.30 - RHC (7/22): EF 20%, low  filling pressures. CI 2.3.  - RHC 11/16  RA 2, PA 65/27 (44), PCW 28, SVR 960, CO 6.5,  CI 3.2 , PA 57%. Milrinone was increased to 0.375 mcg.  - Todays CO-OX 54 %. Continue milrinone 0.375 mcg. - CVP  3-4 . Continue lasix 80 mg twice a day.  -- No bb with low output HF - No ivabradine given AF   - Continue digoxin.  Dig level 11/2 0.3 - Continue midodrine 10 mg TID   - Discussed with [name] -  not a candidate for transplant.  - VAD with AVR planned for Friday  - . Continue inotropic support, ambulation and aggressive nutrition interventions.   2. Sepsis - H/o strep bacteremia 11/2020.  ICD extracted 12/04/20. Barostim was not removed as it is extravascular.  - Received IV antibiotics until 01/15/21. Blood cultures negative 01/30/21..  - Procalcitonin 0.22->0.11 - 10/26 -Blood CX x2  Negative  - Ct chest concerning for pneumonia. Treated w/ cefepime/vancomycin -> Augmentin (stop 11/7) - Resolved  3. Acute Hypoxic Respiratory Failure  - CXR with pulmonary edema initially, and was diuresed with IV Lasix.  -  CT chest completed 10/30 concerning for bronchopneumonia. On augmentin.  - Improved/resolved.  - On 2 liters .    4. CAD  s/p anterior STEMI (12/20). LHC showed chronically occluded RCA (with L>>R collaterals) and thrombotic occlusion of proximal LAD. Underwent PCI of LAD. - LHC (7/22) with stable CAD.  - No chest pain.  - Continue atorvastatin 80. No aspirin d/t anticoagulation.  5. 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 H./O RUE DVT - Off eliquis as noted above.  - On heparin drip.   7. Severe protein-calorie malnutrition - prealbumin 11>15> 13 despite adequate caloric intake on calorie count - Pre-albumin dropping.  - Dietitian following with supplements adjusted.  - repeat calorie count completed and was adequate  8. Iron-deficiency anemia - Hgb 8.1 - Rec'd Feraheme 11/8 and 11/13   9. Paroxysmal  Atrial Fibrillation w/ RVR - new, developed 11/10. Now back in NSR   - Maintaining NSR. Continue amio gtt 30/hr while on milrinone - Off eliquis 11/20. Switched to heparin drip.  - Mag stable 2.1 K 3.6   - Keep K > 4.0 and Mg >2.0   10. Hypokalemia - K 3.6    11. Hyponatremia - Sodium stable @ 132 today   - restrict free water\  Continue to ambulate. VAD on Friday.   Length of Stay: [place], NP  04/02/2021, 9:19 AM  Advanced Heart Failure Team Pager [phone] (M-F; 7a - 5p)  Please contact [facility] for night-coverage after hours (5p -7a ) and weekends on [email or web address]  Remains on milrinone. Co-ox marginal. Continues to diurese well. Breathing better. Weight still not at baseline   General:  Sitting in chair No resp difficulty HEENT: normal Neck: supple. no JVD. Carotids 2+ bilat; no bruits. No lymphadenopathy or thryomegaly appreciated. Cor: PMI nondisplaced. Regular rate & rhythm. N2/6 MR. Lungs: clear Abdomen: soft, nontender, nondistended. No hepatosplenomegaly. No bruits or masses. Good bowel sounds. Extremities: no cyanosis, clubbing, rash, edema Neuro: alert & orientedx3, cranial nerves grossly intact. moves all 4 extremities w/o difficulty. Affect pleasant  Remains tenuous on milrinone. Volume status improving. Remains in NSR on amio. Continue ambulation and nutrition efforts. VAD/AVR on Friday.   Glori Bickers, MD  10:32 AM

## 2021-04-02 NOTE — Telephone Encounter (Signed)
CSW contacted patient's sister [name] who states she is ready for the caregiver role and asked multiple questions about implant day and visitation. CSW confirmed commitment to 24/7 caregiver role post hospitalization and discussed ongoing caregiver role and education. Sister states other family members will be in town and all are very supportive of patient.   CSW contacted patient's neighbor [name] who will be the back up caregiver. She confirmed her commitment to caregiver role and will be available as needed for the 24/7 role as well. She asked good questions about training and education of the LVAD. CSW reviewed training and caregiver verbalizes understanding and denies any concerns at this time. [name], [place], [employer]

## 2021-04-02 NOTE — Plan of Care (Signed)
  Problem: Clinical Measurements: Goal: Ability to maintain clinical measurements within normal limits will improve Outcome: Not Progressing Goal: Cardiovascular complication will be avoided Outcome: Not Progressing

## 2021-04-02 NOTE — Progress Notes (Signed)
VAD COORDINATOR ROUNDING NOTE:  Met with pt at bedside this morning. Discussed VAD implant for Friday. All of pts questions were answered. No family at bedside. Pre-Intermacs follow up completed including:  Quality of Life, KCCQ-12, and Neurocognitive trail making.   6 minute walk ordered for today.  Tanda Rockers RN, BSN VAD Coordinator 24/7 Pager [phone]

## 2021-04-02 NOTE — Progress Notes (Signed)
Mobility Specialist Progress Note    04/02/21 1637  Mobility  Activity Ambulated in hall  Level of Assistance Modified independent, requires aide device or extra time  Assistive Device  (IV pole)  Distance Ambulated (ft) 510 ft  Mobility Ambulated independently in hallway  Mobility Response Tolerated well  Mobility performed by Mobility specialist  Bed Position Chair  $Mobility charge 1 Mobility   Pt received in chair and agreeable. No complaints on walk. Ambulated on 2LO2. Returned to chair with call bell in reach and family present in room.   [facility] Mobility Specialist  M.S. Primary Phone: [phone] M.S. Secondary Phone: [phone]

## 2021-04-02 NOTE — Progress Notes (Signed)
PROGRESS NOTE    Brandon [name]  DVV:[id number] DOB: March 15, 1956 DOA: 03/07/2021 PCP: [name], MD   Brief Narrative: 65 year old with past medical history significant for CAD, chronic systolic heart failure with AICD extracted 7/20-second/2022 due to infection, completed antibiotics on 01/15/2021 with Rocephin, he has moderate aortic stenosis, right upper extremity DVT presents to the ED with acute onset of dyspnea.  He was found to be hypoxic placed on 2 L of oxygen found to be in acute decompensated heart failure.  Cardiology consulted.  Significant events: 10/29 Co-ox 48%, started on milrinone 0.25.  10/30 IV Antibiotics stopped. Blood cultures no growth for 5 days.  10/31 started on augmentin. Milrinone cut back to 0.125 mcg.  03/12/2021 started on midodrine  03/24/2021 started back on milrinone   Assessment & Plan:   Principal Problem:   Acute on chronic respiratory failure with hypoxia (HCC) Active Problems:   Hyperlipidemia with target LDL less than 70   Essential hypertension   CAD (coronary artery disease)   Ischemic cardiomyopathy   Centrilobular emphysema (HCC)   Normocytic anemia   Respiratory failure (HCC)   Bacteremia   Systemic inflammatory response syndrome (SIRS) (HCC)   Chronic systolic CHF (congestive heart failure) (HCC)   Ankylosing spondylitis (HCC)   Leukocytosis   CAP (community acquired pneumonia)   ILD (interstitial lung disease) ([place])   Aspiration pneumonia of both upper lobes (HCC)   Protein-calorie malnutrition, severe  1-Pneumonia/sepsis: Sepsis present on admission, ruled in. Patient was initially started on vancomycin and cefepime which was stopped on 10/30. High-resolution CT scan of the chest showed bronchopneumonia, ID started him back on Augmentin to complete 7 more days. He completed Augmentin on 11/7. WBC normalized.   2-Acute respiratory failure with hypoxemia due to Acute on Chronic Systolic Heart Failure: -Last ECHO July  2022 ejection fraction 20% Continue with  ivabradine, digoxin, midodrine and milrinone. Heart failure team following and managing. Underwent cath 11/16, Milrirone increase  to 0.375. Not a transplant candidate per [name]. Plan  for LVAD is 11/25. Continue with Lasix  80 mg IV BID> received a dose of Metolazone 11/18.  2.4 L urine out put.  Completed Diamox  4 doses.   Hypokalemia: On KCL 40 meq TID> ---increase to 60 meq TID>  Improved. Monitor labs.   Dyslipidemia;  -Continue with atorvastatin.  Right upper extremity DVT with neuropathy: Continue with gabapentin Eliquis was transition to Heparin 11/20.   Depression: Continue with sertraline  BPH: Continue with Flomax Protein caloric malnutrition: Continue with Ensure, calorie count.  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   Mild hyponatremia in the setting of heart failure. Stable.   A. fib with RVR: He was a started on amiodarone infusion. Currently on heparin.   Hypokalemia: He has 40 meq TID ordered.  Replete magnesium. Level 1.8   Anemia: Iron deficiency: Received IV iron x2 Hemoglobin stable 8.1 B12 level low normal range. Started  B12 supplement.   Severe Protein caloric malnutrition.  On supplement.  Follow by nutritionist.    Nutrition Problem: Severe Malnutrition Etiology: chronic illness (CHF)    Signs/Symptoms: severe muscle depletion, severe fat depletion    Interventions: MVI, Prostat, Boost Breeze  Estimated body mass index is 23.05 kg/m as calculated from the following:   Height as of this encounter: 6' (1.829 m).   Weight as of this encounter: 77.1 kg.   DVT prophylaxis: Eliquis Code  Status: Full code Family Communication: sister who was at bedside 11/16. Disposition Plan:  Status is: Inpatient  Remains inpatient appropriate because: Admitted with heart failure exacerbation,  pneumonia.        Consultants:  Heart failure team  Procedures:  Right side cardiac cath 11/16  Antimicrobials:    Subjective: He is up, sitting recliner. Feels better.   Objective: Vitals:   04/01/21 1945 04/01/21 1947 04/01/21 2330 04/02/21 0000  BP: 101/68  100/67   Pulse: 99  93   Resp: 20  20   Temp:  98.1 F (36.7 C) 98.2 F (36.8 C)   TempSrc:  Oral Oral   SpO2:   98% 94%  Weight:      Height:        Intake/Output Summary (Last 24 hours) at 04/02/2021 0715 Last data filed at 04/02/2021 0300 Gross per 24 hour  Intake 941.85 ml  Output 2400 ml  Net -1458.15 ml    Filed Weights   03/30/21 0322 03/31/21 0354 04/01/21 0500  Weight: 78.8 kg 77.9 kg 77.1 kg    Examination:  General exam: NAD Respiratory system: CTA Cardiovascular system: S 1, S 2 RRR Gastrointestinal system: BS present, soft, nt Central nervous system: Alert, conversant.  Extremities: Trace edema.    Data Reviewed: I have personally reviewed following labs and imaging studies  CBC: Recent Labs  Lab 03/29/21 0600 03/30/21 0510 03/31/21 0400 04/01/21 0405 04/02/21 0430  WBC 11.3* 9.7 10.1 10.1 10.0  HGB 8.1* 8.3* 8.2* 8.6* 8.1*  HCT 27.1* 27.8* 27.1* 28.4* 27.3*  MCV 80.9 82.0 80.9 81.1 83.7  PLT 340 340 326 344 889    Basic Metabolic Panel: Recent Labs  Lab 03/29/21 0600 03/30/21 0510 03/31/21 0400 04/01/21 0544 04/01/21 0656 04/01/21 1402 04/01/21 2224 04/02/21 0430  NA 132* 133* 133* 133*  --  130* 131* 132*  K 3.7 3.9 3.4* 2.5* 2.8* 2.8* 3.8 3.6  CL 91* 94* 91* 91*  --  89* 96* 97*  CO2 33* 33* 34* 33*  --  31 28 28   GLUCOSE 127* 153* 100* 100*  --  241* 127* 104*  BUN 26* 25* 22 23  --  21 25* 24*  CREATININE 0.73 0.70 0.71 0.80  --  0.74 0.77 0.75  CALCIUM 9.1 9.1 8.9 8.7*  --  8.7* 8.8* 8.8*  MG 2.1 2.0 1.8 2.1  --   --   --  2.1  PHOS  --   --   --   --   --  4.0  --   --     GFR: Estimated Creatinine Clearance: 97.7 mL/min (by C-G formula based  on SCr of 0.75 mg/dL). Liver Function Tests: No results for input(s): AST, ALT, ALKPHOS, BILITOT, PROT, ALBUMIN in the last 168 hours. No results for input(s): LIPASE, AMYLASE in the last 168 hours. No results for input(s): AMMONIA in the last 168 hours. Coagulation Profile: No results for input(s): INR, PROTIME in the last 168 hours. Cardiac Enzymes: No results for input(s): CKTOTAL, CKMB, CKMBINDEX, TROPONINI in the last 168 hours. BNP (last 3 results) No results for input(s): PROBNP in the last 8760 hours. HbA1C: No results for input(s): HGBA1C in the last 72 hours. CBG: No results for input(s): GLUCAP in the last 168 hours. Lipid Profile: No results for input(s): CHOL, HDL, LDLCALC, TRIG, CHOLHDL, LDLDIRECT in the last 72 hours. Thyroid Function Tests: No results for input(s): TSH, T4TOTAL, FREET4, T3FREE, THYROIDAB in the last 72 hours. Anemia Panel:  No results for input(s): VITAMINB12, FOLATE, FERRITIN, TIBC, IRON, RETICCTPCT in the last 72 hours.  Sepsis Labs: No results for input(s): PROCALCITON, LATICACIDVEN in the last 168 hours.  No results found for this or any previous visit (from the past 240 hour(s)).       Radiology Studies: No results found.      Scheduled Meds:  (feeding supplement) PROSource Plus  30 mL Oral QID   atorvastatin  80 mg Oral QPM   Chlorhexidine Gluconate Cloth  6 each Topical Daily   clonazepam  0.25 mg Oral BID   digoxin  0.125 mg Oral Daily   diphenhydrAMINE  50 mg Oral QHS   ezetimibe  10 mg Oral Daily   feeding supplement  237 mL Oral TID BM   folic acid  1 mg Oral Daily   furosemide  80 mg Intravenous BID   gabapentin  200 mg Oral TID   lidocaine  1 patch Transdermal Q24H   mouth rinse  15 mL Mouth Rinse BID   melatonin  5 mg Oral QHS   midodrine  10 mg Oral TID WC   multivitamin with minerals  1 tablet Oral Daily   pantoprazole  40 mg Oral Daily   polyethylene glycol  17 g Oral Daily   polyvinyl alcohol  1 drop Both Eyes  BID   potassium chloride  60 mEq Oral TID   senna  2 tablet Oral BID   sertraline  100 mg Oral Daily   sodium chloride flush  10-40 mL Intracatheter Q12H   sodium chloride flush  3 mL Intravenous Q12H   sodium chloride flush  3 mL Intravenous Q12H   tamsulosin  0.4 mg Oral Daily   vitamin B-12  100 mcg Oral Daily   Continuous Infusions:  sodium chloride     amiodarone 30 mg/hr (04/02/21 0300)   heparin 1,200 Units/hr (04/02/21 0300)   milrinone 0.375 mcg/kg/min (04/02/21 0324)     LOS: 25 days    Time spent: 35 Minutes.     Elmarie Shiley, MD [facility]   If 7PM-7AM, please contact night-coverage [email or web address]  04/02/2021, 7:15 AM

## 2021-04-02 NOTE — Progress Notes (Signed)
CSW met at bedside with patient to review caregiver plan for post implant. Patient states his sister Brandon [name] will be the primary and his neighbor Brandon [name] will be the back up. Patient was in good spirits and stated he is doing ok. He is hopeful to go for a walk this afternoon and states he is ready for surgery. CSW informed patient that CSW will contact caregivers to follow up on plan later today. Patient states provided contact information for both caregivers. CSW continues to follow for support and VAD implant. [name], [place], [place]

## 2021-04-02 NOTE — Plan of Care (Signed)

## 2021-04-02 NOTE — Progress Notes (Signed)
ANTICOAGULATION CONSULT NOTE  Pharmacy Consult for heparin Indication: atrial fibrillation  No Active Allergies  Patient Measurements: Height: 6' (182.9 cm) Weight: 77.1 kg (169 lb 15.6 oz) IBW/kg (Calculated) : 77.6 Heparin Dosing Weight: 77kg  Vital Signs: Temp: 97.3 F (36.3 C) (11/21 2009) Temp Source: Oral (11/21 2009) BP: 106/64 (11/21 2009) Pulse Rate: 96 (11/21 2009)  Labs: Recent Labs    03/31/21 0400 04/01/21 0405 04/01/21 0544 04/01/21 1402 04/01/21 2224 04/02/21 0430 04/02/21 0700 04/02/21 1843  HGB 8.2* 8.6*  --   --   --  8.1*  --   --   HCT 27.1* 28.4*  --   --   --  27.3*  --   --   PLT 326 344  --   --   --  320  --   --   APTT  --   --   --   --   --   --  41* 47*  HEPARINUNFRC  --   --   --   --   --   --  >1.10*  --   CREATININE 0.71  --    < > 0.74 0.77 0.75  --   --    < > = values in this interval not displayed.     Estimated Creatinine Clearance: 97.7 mL/min (by C-G formula based on SCr of 0.75 mg/dL).   Medical History: Past Medical History:  Diagnosis Date   AICD (automatic cardioverter/defibrillator) present 08/31/2019   Ankylosing spondylitis ([place])    Arthritis    BENIGN PROSTATIC HYPERTROPHY 06/07/2008   CHF (congestive heart failure) ([place])    COLONIC POLYPS, HX OF 06/07/2008   Depression    H/O hiatal hernia    Heart failure ([place])    HYPERLIPIDEMIA 06/07/2008   HYPERTENSION 06/07/2008   Myocardial infarction ([place]) 2005   NSTEMI, s/p LAD stent   NEPHROLITHIASIS, HX OF 06/07/2008   STEMI (ST elevation myocardial infarction) ([place]) 04/27/2019     Assessment: 88 yoM with end stage HFrEF with planned LVAD implant on 11/25. Pt is on apixaban for hx DVT and AFib, now to transition to IV heparin in anticipation of surgery.  Last dose of apixaban was 11/20 at 10am CBC stable - will utilize aPTTs until apixaban affect wears off   Heparin drip 1400 uts/hr and aPTT up to 47   Goal of Therapy:  Heparin level 0.3-0.7 units/ml aPTT  66-102 seconds Monitor platelets by anticoagulation protocol: Yes   Plan:  -Increase heparin to 1650 units/he -recheck heparin level, aPTT and CBC in am  Hildred Laser, PharmD Clinical Pharmacist **Pharmacist phone directory can now be found on [email or web address] ([email or web address]).  Listed under [place].

## 2021-04-02 NOTE — Progress Notes (Signed)
CARDIAC REHAB PHASE I   Went to offer to walk with pt. Pt just returned from walk with mobility. Pt requesting for a trip outside later today. Will return as time allows.  [phone] Rufina Falco, RN BSN 04/02/2021 11:27 AM

## 2021-04-03 DIAGNOSIS — E43 Unspecified severe protein-calorie malnutrition: Secondary | ICD-10-CM | POA: Diagnosis not present

## 2021-04-03 DIAGNOSIS — I255 Ischemic cardiomyopathy: Secondary | ICD-10-CM | POA: Diagnosis not present

## 2021-04-03 DIAGNOSIS — I509 Heart failure, unspecified: Secondary | ICD-10-CM | POA: Diagnosis not present

## 2021-04-03 LAB — CBC
HCT: 28.2 % — ABNORMAL LOW (ref 39.0–52.0)
Hemoglobin: 8.6 g/dL — ABNORMAL LOW (ref 13.0–17.0)
MCH: 24.9 pg — ABNORMAL LOW (ref 26.0–34.0)
MCHC: 30.5 g/dL (ref 30.0–36.0)
MCV: 81.5 fL (ref 80.0–100.0)
Platelets: 290 10*3/uL (ref 150–400)
RBC: 3.46 MIL/uL — ABNORMAL LOW (ref 4.22–5.81)
RDW: 20.1 % — ABNORMAL HIGH (ref 11.5–15.5)
WBC: 9.8 10*3/uL (ref 4.0–10.5)
nRBC: 0 % (ref 0.0–0.2)

## 2021-04-03 LAB — BASIC METABOLIC PANEL
Anion gap: 10 (ref 5–15)
BUN: 30 mg/dL — ABNORMAL HIGH (ref 8–23)
CO2: 26 mmol/L (ref 22–32)
Calcium: 9.3 mg/dL (ref 8.9–10.3)
Chloride: 96 mmol/L — ABNORMAL LOW (ref 98–111)
Creatinine, Ser: 0.77 mg/dL (ref 0.61–1.24)
GFR, Estimated: >60 mL/min (ref >60)
Glucose, Bld: 116 mg/dL — ABNORMAL HIGH (ref 70–99)
Potassium: 3.8 mmol/L (ref 3.5–5.1)
Sodium: 132 mmol/L — ABNORMAL LOW (ref 135–145)

## 2021-04-03 LAB — HEPARIN LEVEL (UNFRACTIONATED): Heparin Unfractionated: 0.71 IU/mL — ABNORMAL HIGH (ref 0.30–0.70)

## 2021-04-03 LAB — COOXEMETRY PANEL
Carboxyhemoglobin: 1.7 % — ABNORMAL HIGH (ref 0.5–1.5)
Methemoglobin: 0.8 % (ref 0.0–1.5)
O2 Saturation: 61.4 %
Total hemoglobin: 8.8 g/dL — ABNORMAL LOW (ref 12.0–16.0)

## 2021-04-03 LAB — APTT
aPTT: 68 seconds — ABNORMAL HIGH (ref 24–36)
aPTT: 58 seconds — ABNORMAL HIGH (ref 24–36)

## 2021-04-03 LAB — DIGOXIN LEVEL: Digoxin Level: 0.5 ng/mL — ABNORMAL LOW (ref 0.8–2.0)

## 2021-04-03 LAB — MAGNESIUM: Magnesium: 2 mg/dL (ref 1.7–2.4)

## 2021-04-03 MED ORDER — CLONAZEPAM 0.25 MG PO TBDP
0.2500 mg | ORAL_TABLET | Freq: Two times a day (BID) | ORAL | Status: DC | PRN
  Administered 2021-04-03 – 2021-04-05 (×4): 0.25 mg via ORAL
  Filled 2021-04-03 (×5): qty 1

## 2021-04-03 MED ORDER — CLONAZEPAM 0.1 MG/ML ORAL SUSPENSION: 0.2500 mg | Freq: Two times a day (BID) | ORAL | Status: DC | PRN

## 2021-04-03 NOTE — Progress Notes (Addendum)
ANTICOAGULATION CONSULT NOTE  Pharmacy Consult for heparin Indication: atrial fibrillation  No Active Allergies  Patient Measurements: Height: 6' (182.9 cm) Weight: 77.9 kg (171 lb 11.8 oz) IBW/kg (Calculated) : 77.6 Heparin Dosing Weight: 77kg  Vital Signs: Temp: 97.8 F (36.6 C) (11/22 0345) Temp Source: Oral (11/22 0345) BP: 103/75 (11/22 0345) Pulse Rate: 102 (11/22 0345)  Labs: Recent Labs    04/01/21 0405 04/01/21 0544 04/01/21 2224 04/02/21 0430 04/02/21 0700 04/02/21 1843 04/03/21 0422  HGB 8.6*  --   --  8.1*  --   --  8.6*  HCT 28.4*  --   --  27.3*  --   --  28.2*  PLT 344  --   --  320  --   --  290  APTT  --   --   --   --  41* 47* 68*  HEPARINUNFRC  --   --   --   --  >1.10*  --  0.71*  CREATININE  --    < > 0.77 0.75  --   --  0.77   < > = values in this interval not displayed.     Estimated Creatinine Clearance: 98.3 mL/min (by C-G formula based on SCr of 0.77 mg/dL).   Medical History: Past Medical History:  Diagnosis Date   AICD (automatic cardioverter/defibrillator) present 08/31/2019   Ankylosing spondylitis ([place])    Arthritis    BENIGN PROSTATIC HYPERTROPHY 06/07/2008   CHF (congestive heart failure) ([place])    COLONIC POLYPS, HX OF 06/07/2008   Depression    H/O hiatal hernia    Heart failure ([place])    HYPERLIPIDEMIA 06/07/2008   HYPERTENSION 06/07/2008   Myocardial infarction ([place]) 2005   NSTEMI, s/p LAD stent   NEPHROLITHIASIS, HX OF 06/07/2008   STEMI (ST elevation myocardial infarction) ([place]) 04/27/2019     Assessment: 11 yoM with end stage HFrEF with planned LVAD implant on 11/25. Pt is on apixaban for hx DVT and AFib, now to transition to IV heparin in anticipation of surgery.  Last dose of apixaban was 11/20 at 10am - will utilize aPTTs until apixaban affect wears off.  aPTT of 68sec now therapeutic after rate increase to 1650 unit/hr.   11/22 PM Update: aPTT subtherapeutic at 58 sec after small rate increase to 1700  units/hr. Level drawn appropriately - running through peripheral, drawn through PICC.  Goal of Therapy:  Heparin level 0.3-0.7 units/ml aPTT 66-102 seconds Monitor platelets by anticoagulation protocol: Yes   Plan:  Increase heparin infusion to 1800 units/hr Daily CBC, aPTT, heparin level until correlating  Laurey Arrow, PharmD PGY1 Pharmacy Resident 04/03/2021  4:16 PM  Please check [email or web address] for unit-specific pharmacy phone numbers.

## 2021-04-03 NOTE — Plan of Care (Signed)

## 2021-04-03 NOTE — Progress Notes (Signed)
CSW met at bedside with patient. Patient states he needed an extra clonopin last night but feels that upping the dose helped him tremendously. He spoke at length about life and how he has coped with his anxiety. Patient states "I think I am a logical person and try to cope with life logically". CSW provided supportive intervention and discussed using his toolkit of coping skills to get through the tough times and upcoming recovery from VAD implant. Patient was grateful for the conversation and reminders that he has the coping skills to get through the anxious moments. Patient appears ready for surgery and has good coping skills for recovery. CSW will continue to follow for support throughout implant hospitalization. [name], [place], [place]

## 2021-04-03 NOTE — Progress Notes (Signed)
PROGRESS NOTE    Brandon [name]  BJS:[id number] DOB: 09/18/1955 DOA: 03/07/2021 PCP: [name], MD   Brief Narrative: 65 year old with past medical history significant for CAD, chronic systolic heart failure with AICD extracted 7/20-second/2022 due to infection, completed antibiotics on 01/15/2021 with Rocephin, he has moderate aortic stenosis, right upper extremity DVT presents to the ED with acute onset of dyspnea.  He was found to be hypoxic placed on 2 L of oxygen found to be in acute decompensated heart failure.  Cardiology consulted.  Significant events: 10/29 Co-ox 48%, started on milrinone 0.25.  10/30 IV Antibiotics stopped. Blood cultures no growth for 5 days.  10/31 started on augmentin. Milrinone cut back to 0.125 mcg.  03/12/2021 started on midodrine  03/24/2021 started back on milrinone   Assessment & Plan:   Principal Problem:   Acute on chronic respiratory failure with hypoxia (HCC) Active Problems:   Hyperlipidemia with target LDL less than 70   Essential hypertension   CAD (coronary artery disease)   Ischemic cardiomyopathy   Centrilobular emphysema (HCC)   Normocytic anemia   Respiratory failure (HCC)   Bacteremia   Systemic inflammatory response syndrome (SIRS) (HCC)   Chronic systolic CHF (congestive heart failure) (HCC)   Ankylosing spondylitis (HCC)   Leukocytosis   CAP (community acquired pneumonia)   ILD (interstitial lung disease) ([place])   Aspiration pneumonia of both upper lobes (HCC)   Protein-calorie malnutrition, severe  1-Pneumonia/sepsis: Sepsis present on admission, ruled in. Patient was initially started on vancomycin and cefepime which was stopped on 10/30. High-resolution CT scan of the chest showed bronchopneumonia, ID started him back on Augmentin to complete 7 more days. He completed Augmentin on 11/7. WBC normalized.   2-Acute respiratory failure with hypoxemia due to Acute on Chronic Systolic Heart Failure: -Last ECHO July  2022 ejection fraction 20% Continue with  ivabradine, digoxin, midodrine and milrinone. Heart failure team following and managing. Underwent cath 11/16, Milrirone increase  to 0.375. Not a transplant candidate per [name]. Plan  for LVAD is 11/25. Continue with Lasix  80 mg IV BID> received a dose of Metolazone 11/18.  Completed Diamox  4 doses.  Report chest soreness. EKG no ST elevation.   Hypokalemia: On KCL 40 meq TID> ---increase to 60 meq TID>  Improved. Monitor labs.   Dyslipidemia;  -Continue with atorvastatin.  Right upper extremity DVT with neuropathy: Continue with gabapentin Eliquis was transition to Heparin 11/20.   Depression: Continue with sertraline  BPH: Continue with Flomax Protein caloric malnutrition: Continue with Ensure, calorie count.  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   Mild hyponatremia in the setting of heart failure. Stable.   A. fib with RVR: He was a started on amiodarone infusion. Currently on heparin.    Anemia: Iron deficiency: Received IV iron x2 Hemoglobin stable 8.1 B12 level low normal range. Started  B12 supplement.   Severe Protein caloric malnutrition.  On supplement.  Follow by nutritionist.   Anxiety;  On schedule Klonopin.  Will add BID PRN klonopin to help with anxiety   Nutrition Problem: Severe Malnutrition Etiology: chronic illness (CHF)    Signs/Symptoms: severe muscle depletion, severe fat depletion    Interventions: MVI, Prostat, Boost Breeze  Estimated body mass index is 23.29 kg/m as calculated from the following:   Height as of this encounter: 6' (1.829 m).   Weight as of this encounter: 77.9 kg.  DVT prophylaxis: Eliquis Code Status: Full code Family Communication: sister who was at bedside 11/16. Disposition Plan:  Status is: Inpatient  Remains inpatient appropriate because: Admitted with heart failure  exacerbation, pneumonia.        Consultants:  Heart failure team  Procedures:  Right side cardiac cath 11/16  Antimicrobials:    Subjective: He was having anxiety last night, because day of surgery is closer.  We discussed about ordering 0.25 klonopin PRN BID on top of his schedule dose.  Report mild chest soreness when he lying down.   Objective: Vitals:   04/02/21 2300 04/03/21 0345 04/03/21 0447 04/03/21 0739  BP: 101/67 103/75  103/61  Pulse: 97 (!) 102  97  Resp: 19 20  17   Temp: 98.2 F (36.8 C) 97.8 F (36.6 C)  98.2 F (36.8 C)  TempSrc: Oral Oral  Oral  SpO2: 96% 97%  100%  Weight:   77.9 kg   Height:        Intake/Output Summary (Last 24 hours) at 04/03/2021 0900 Last data filed at 04/03/2021 0600 Gross per 24 hour  Intake 1033.14 ml  Output 2550 ml  Net -1516.86 ml    Filed Weights   03/31/21 0354 04/01/21 0500 04/03/21 0447  Weight: 77.9 kg 77.1 kg 77.9 kg    Examination:  General exam:  NAD Respiratory system: CTA Cardiovascular system: S 1, S 2 RRR Gastrointestinal system: BS present, soft ,nt Central nervous system: Alert, follows command Extremities: trace edema   Data Reviewed: I have personally reviewed following labs and imaging studies  CBC: Recent Labs  Lab 03/30/21 0510 03/31/21 0400 04/01/21 0405 04/02/21 0430 04/03/21 0422  WBC 9.7 10.1 10.1 10.0 9.8  HGB 8.3* 8.2* 8.6* 8.1* 8.6*  HCT 27.8* 27.1* 28.4* 27.3* 28.2*  MCV 82.0 80.9 81.1 83.7 81.5  PLT 340 326 344 320 248    Basic Metabolic Panel: Recent Labs  Lab 03/30/21 0510 03/31/21 0400 04/01/21 0544 04/01/21 0656 04/01/21 1402 04/01/21 2224 04/02/21 0430 04/03/21 0422  NA 133* 133* 133*  --  130* 131* 132* 132*  K 3.9 3.4* 2.5* 2.8* 2.8* 3.8 3.6 3.8  CL 94* 91* 91*  --  89* 96* 97* 96*  CO2 33* 34* 33*  --  31 28 28 26   GLUCOSE 153* 100* 100*  --  241* 127* 104* 116*  BUN 25* 22 23  --  21 25* 24* 30*  CREATININE 0.70 0.71 0.80  --  0.74 0.77 0.75  0.77  CALCIUM 9.1 8.9 8.7*  --  8.7* 8.8* 8.8* 9.3  MG 2.0 1.8 2.1  --   --   --  2.1 2.0  PHOS  --   --   --   --  4.0  --   --   --     GFR: Estimated Creatinine Clearance: 98.3 mL/min (by C-G formula based on SCr of 0.77 mg/dL). Liver Function Tests: No results for input(s): AST, ALT, ALKPHOS, BILITOT, PROT, ALBUMIN in the last 168 hours. No results for input(s): LIPASE, AMYLASE in the last 168 hours. No results for input(s): AMMONIA in the last 168 hours. Coagulation Profile: No results for input(s): INR, PROTIME in the last 168 hours. Cardiac Enzymes: No results for input(s): CKTOTAL, CKMB, CKMBINDEX, TROPONINI in the last 168 hours. BNP (last 3 results) No results for input(s): PROBNP in the last 8760 hours. HbA1C: No results for input(s): HGBA1C in the last 72 hours. CBG: No results for input(s): GLUCAP in the last  168 hours. Lipid Profile: No results for input(s): CHOL, HDL, LDLCALC, TRIG, CHOLHDL, LDLDIRECT in the last 72 hours. Thyroid Function Tests: No results for input(s): TSH, T4TOTAL, FREET4, T3FREE, THYROIDAB in the last 72 hours. Anemia Panel: No results for input(s): VITAMINB12, FOLATE, FERRITIN, TIBC, IRON, RETICCTPCT in the last 72 hours.  Sepsis Labs: No results for input(s): PROCALCITON, LATICACIDVEN in the last 168 hours.  Recent Results (from the past 240 hour(s))  Surgical PCR screen     Status: None   Collection Time: 04/02/21 12:30 PM   Specimen: Nasal Mucosa; Nasal Swab  Result Value Ref Range Status   [name], PCR NEGATIVE NEGATIVE Final   Staphylococcus aureus NEGATIVE NEGATIVE Final    Comment: (NOTE) The Xpert SA Assay (FDA approved for NASAL specimens in patients 32 years of age and older), is one component of a comprehensive surveillance program. It is not intended to diagnose infection nor to guide or monitor treatment. Performed at [facility], [place] [address], [place], [place] [address]          Radiology Studies: No  results found.      Scheduled Meds:  (feeding supplement) PROSource Plus  30 mL Oral QID   atorvastatin  80 mg Oral QPM   Chlorhexidine Gluconate Cloth  6 each Topical Daily   clonazepam  0.25 mg Oral BID   digoxin  0.125 mg Oral Daily   diphenhydrAMINE  50 mg Oral QHS   ezetimibe  10 mg Oral Daily   feeding supplement  237 mL Oral TID BM   folic acid  1 mg Oral Daily   furosemide  80 mg Intravenous BID   gabapentin  200 mg Oral TID   lidocaine  1 patch Transdermal Q24H   mouth rinse  15 mL Mouth Rinse BID   melatonin  5 mg Oral QHS   midodrine  10 mg Oral TID WC   multivitamin with minerals  1 tablet Oral Daily   pantoprazole  40 mg Oral Daily   polyethylene glycol  17 g Oral Daily   polyvinyl alcohol  1 drop Both Eyes BID   potassium chloride  60 mEq Oral TID   senna  2 tablet Oral BID   sertraline  100 mg Oral Daily   sodium chloride flush  10-40 mL Intracatheter Q12H   sodium chloride flush  3 mL Intravenous Q12H   sodium chloride flush  3 mL Intravenous Q12H   tamsulosin  0.4 mg Oral Daily   vitamin B-12  100 mcg Oral Daily   Continuous Infusions:  sodium chloride     amiodarone 30 mg/hr (04/02/21 2351)   heparin 1,650 Units/hr (04/03/21 0437)   milrinone 0.375 mcg/kg/min (04/03/21 0239)     LOS: 26 days    Time spent: 35 Minutes.     Elmarie Shiley, MD [facility]   If 7PM-7AM, please contact night-coverage [email or web address]  04/03/2021, 9:00 AM

## 2021-04-03 NOTE — TOC Initial Note (Signed)
Transition of Care [facility]) - Initial/Assessment Note    Patient Details  Name: Brandon [name] MRN: [id number] Date of Birth: 03-10-1956  [facility] CM/SW Contact:    Erenest Rasher, RN Phone Number: [phone] 04/03/2021, 10:08 AM  Clinical Narrative:                 HF TOC chart reviewed, and plan is for LVAD on 04/06/2021. [place] to follow for [facility] Contacted [facility] agency rep, Amy to make aware that pt requested [place], he had them in the past. Will continue to follow for dc needs.   Expected Discharge Plan: [place] Barriers to Discharge: Continued Medical Work up   Patient Goals and CMS Choice Patient states their goals for this hospitalization and ongoing recovery are:: wants to remain independent CMS Medicare.gov Compare Post Acute Care list provided to:: Patient Choice offered to / list presented to : Patient  Expected Discharge Plan and Services Expected Discharge Plan: [place] In-house Referral: Clinical Social Work Discharge Planning Services: CM Consult Post Acute Care Choice: [place] arrangements for the past 2 months: [place]: RN [place] Agency: [place] ([place]) Date [place]: 04/03/21 Time [place]: 1007 Representative spoke with at [place]: Elijio Miles  Prior Living Arrangements/Services Living arrangements for the past 2 months: [place] with:: Self Patient language and need for interpreter reviewed:: No Do you feel safe going back to the place where you live?: Yes      Need for Family Participation in Patient Care: Yes (Comment) Care giver support system in place?: Yes (comment) Current home services:  (rolling walker, bedside commode, oxygen ([place])) Criminal Activity/Legal Involvement Pertinent to Current  Situation/Hospitalization: No - Comment as needed  Activities of Daily Living Home Assistive Devices/Equipment: Scales ADL Screening (condition at time of admission) Patient's cognitive ability adequate to safely complete daily activities?: Yes Is the patient deaf or have difficulty hearing?: No Does the patient have difficulty seeing, even when wearing glasses/contacts?: No Does the patient have difficulty concentrating, remembering, or making decisions?: No Patient able to express need for assistance with ADLs?: Yes Does the patient have difficulty dressing or bathing?: No Independently performs ADLs?: Yes (appropriate for developmental age) Does the patient have difficulty walking or climbing stairs?: Yes Weakness of Legs: Both Weakness of Arms/Hands: None  Permission Sought/Granted Permission sought to share information with : Case Manager, Family Supports, PCP Permission granted to share information with : Yes, Verbal Permission Granted  Share Information with NAME: [place] granted to share info w AGENCY: [place] granted to share info w Relationship: sister  Permission granted to share info w Contact Information: [phone]  Emotional Assessment Appearance:: Appears stated age Attitude/Demeanor/Rapport: Gracious, Engaged Affect (typically observed): Accepting Orientation: : Oriented to Self, Oriented to Place, Oriented to  Time, Oriented to Situation   Psych Involvement: No (comment)  Admission diagnosis:  Acute on chronic respiratory failure (HCC) [J96.20] Respiratory failure (HCC) [J96.90] Patient Active Problem List   Diagnosis Date Noted   Protein-calorie malnutrition, severe 03/14/2021   Aspiration pneumonia of both upper lobes ([place])    CAP (community acquired pneumonia)    ILD (interstitial lung disease) ([place])  Ankylosing spondylitis (HCC)    Leukocytosis    Acute on chronic respiratory failure with hypoxia ([place])  03/07/2021   Systemic inflammatory response syndrome (SIRS) ([place]) 43/15/4008   Chronic systolic CHF (congestive heart failure) ([place]) 03/07/2021   Acute on chronic systolic (congestive) heart failure ([place]) 01/08/2021   ICD (implantable cardioverter-defibrillator) in place    Dental caries    Malnutrition of moderate degree 12/01/2020   Pressure injury of skin 12/01/2020   Cholelithiases    Aortic valve endocarditis    Pleural effusion    Aspiration into airway    Elevated brain natriuretic peptide (BNP) level 11/30/2020   Elevated troponin 11/30/2020   Hypokalemia 11/30/2020   Hypomagnesemia 11/30/2020   Normocytic anemia 11/30/2020   Dyspnea    Respiratory failure ([place])    History of ETT    Infected defibrillator ([place])    Bacteremia    Central line infection    Acute respiratory failure with hypoxia and hypercapnia ([place]) 11/29/2020   Centrilobular emphysema ([place]) 08/09/2020   Benign neoplasm of colon    Heart failure ([place]) 12/31/2019   Ischemic cardiomyopathy 08/31/2019   Cardiogenic shock (HCC)    STEMI (ST elevation myocardial infarction) ([place]) 04/27/2019   Acute anterior wall MI ([place]) 04/27/2019   Mild aortic stenosis 12/02/2012   Ankylosis of sacroiliac joint 03/08/2011   TOBACCO ABUSE 03/13/2009   Hyperlipidemia with target LDL less than 70 06/07/2008   Essential hypertension 06/07/2008   CAD (coronary artery disease) 06/07/2008   BENIGN PROSTATIC HYPERTROPHY 06/07/2008   History of colon polyps 06/07/2008   NEPHROLITHIASIS, HX OF 06/07/2008   PCP:  [name], MD Pharmacy:   [id number] - [place], [place] - [address] [place] [address] Phone: [phone] Fax: [phone]  [id number] - [place], [place] - [address] [place] [address] Phone: [phone] Fax: [phone]  [facility] [id number] [place] [place] [address] Phone:  [phone] Fax: [phone]     Social Determinants of Health (SDOH) Interventions    Readmission Risk Interventions No flowsheet data found.

## 2021-04-03 NOTE — Progress Notes (Addendum)
Patient ID: HURMAN KETELSEN, male   DOB: 04-14-1956, 65 y.o.   MRN: [id number]     Advanced Heart Failure Rounding Note  PCP-Cardiologist: [name], MD   Subjective:    Admitted with sepsis. Started on cefepime + vanc, now off abx (ID following). Afebrile.  10/27 started on ivabradine 7.5 mg twice a day and midodrine 5 mg TID 10/29 Co-ox 48%, started on milrinone 0.25.  10/30 IV Antibiotics stopped. Blood cultures no growth for 5 days.  10/31 started on Augmentin for PNA.  11/1 Milrinone off 11/2 Milrinone added back. Midodrine increased to 10 mg TID 11/8 Placed on 80 mg po lasix  11/10 Developed Afib w/ RVR, converted with IV amio 11/13 Give 250 cc NS bolus for low CVP.  11/14 Milrinone increased to 0.25 mcg. Diuresed with IV lasix  11/16 Had RHC with elevated left sided filling pressures PCWP 28, PA sat 57%, and normal cardiac output. Milrinone was increased to 0.375 mcg. Continued to diurese with IV lasix.     Co-ox 61% this am on 0.375 milrinone  Is/Os not accurate. Continues on IV lasix. Scr stable at 0.77  No dyspnea. Reports some chest soreness when lying down, improves when turning onto his side or sitting up. Not exertional.  Ambulating halls every day  Nervous about upcoming VAD on 11/25    Objective:   Weight Range: 77.9 kg Body mass index is 23.29 kg/m.   Vital Signs:   Temp:  [97.3 F (36.3 C)-98.2 F (36.8 C)] 97.8 F (36.6 C) (11/22 0345) Pulse Rate:  [89-102] 102 (11/22 0345) Resp:  [19-20] 20 (11/22 0345) BP: (95-112)/(64-78) 103/75 (11/22 0345) SpO2:  [96 %-100 %] 97 % (11/22 0345) Weight:  [77.9 kg] 77.9 kg (11/22 0447) Last BM Date: 04/01/21  Weight change: Filed Weights   03/31/21 0354 04/01/21 0500 04/03/21 0447  Weight: 77.9 kg 77.1 kg 77.9 kg    Intake/Output:   Intake/Output Summary (Last 24 hours) at 04/03/2021 0731 Last data filed at 04/03/2021 0600 Gross per 24 hour  Intake 1033.14 ml  Output 2850 ml  Net -1816.86 ml      Physical Exam  CVP 4-5 General:  Well appearing. No resp difficulty on 2L O2  HEENT: normal Neck: supple. no JVD. Carotids 2+ bilat; no bruits. No lymphadenopathy or thryomegaly appreciated. Cor: PMI nondisplaced. Regular rate & rhythm. No rubs, gallops, 3/6 SEM RUSB Lungs: clear Abdomen: soft, nontender, nondistended. No hepatosplenomegaly. No bruits or masses. Good bowel sounds. Extremities: no cyanosis, clubbing, rash, edema, LUE PICC Neuro: alert & orientedx3, cranial nerves grossly intact. moves all 4 extremities w/o difficulty. Affect pleasant     Telemetry  NSR 90s-low 100s   Labs    CBC Recent Labs    04/02/21 0430 04/03/21 0422  WBC 10.0 9.8  HGB 8.1* 8.6*  HCT 27.3* 28.2*  MCV 83.7 81.5  PLT 320 160   Basic Metabolic Panel Recent Labs    04/01/21 1402 04/01/21 2224 04/02/21 0430 04/03/21 0422  NA 130*   < > 132* 132*  K 2.8*   < > 3.6 3.8  CL 89*   < > 97* 96*  CO2 31   < > 28 26  GLUCOSE 241*   < > 104* 116*  BUN 21   < > 24* 30*  CREATININE 0.74   < > 0.75 0.77  CALCIUM 8.7*   < > 8.8* 9.3  MG  --   --  2.1 2.0  PHOS 4.0  --   --   --    < > =  values in this interval not displayed.   Liver Function Tests No results for input(s): AST, ALT, ALKPHOS, BILITOT, PROT, ALBUMIN in the last 72 hours.  No results for input(s): LIPASE, AMYLASE in the last 72 hours. Cardiac Enzymes No results for input(s): CKTOTAL, CKMB, CKMBINDEX, TROPONINI in the last 72 hours.  BNP: BNP (last 3 results) Recent Labs    11/30/20 0144 01/08/21 0731 01/18/21 1629  BNP 2,653.1* 734.2* 607.9*    ProBNP (last 3 results) No results for input(s): PROBNP in the last 8760 hours.    D-Dimer No results for input(s): DDIMER in the last 72 hours. Hemoglobin A1C No results for input(s): HGBA1C in the last 72 hours. Fasting Lipid Panel No results for input(s): CHOL, HDL, LDLCALC, TRIG, CHOLHDL, LDLDIRECT in the last 72 hours. Thyroid Function Tests No results  for input(s): TSH, T4TOTAL, T3FREE, THYROIDAB in the last 72 hours.  Invalid input(s): FREET3  Other results:   Imaging    No results found.   Medications:     Scheduled Medications:  (feeding supplement) PROSource Plus  30 mL Oral QID   atorvastatin  80 mg Oral QPM   Chlorhexidine Gluconate Cloth  6 each Topical Daily   clonazepam  0.25 mg Oral BID   digoxin  0.125 mg Oral Daily   diphenhydrAMINE  50 mg Oral QHS   ezetimibe  10 mg Oral Daily   feeding supplement  237 mL Oral TID BM   folic acid  1 mg Oral Daily   furosemide  80 mg Intravenous BID   gabapentin  200 mg Oral TID   lidocaine  1 patch Transdermal Q24H   mouth rinse  15 mL Mouth Rinse BID   melatonin  5 mg Oral QHS   midodrine  10 mg Oral TID WC   multivitamin with minerals  1 tablet Oral Daily   pantoprazole  40 mg Oral Daily   polyethylene glycol  17 g Oral Daily   polyvinyl alcohol  1 drop Both Eyes BID   potassium chloride  60 mEq Oral TID   senna  2 tablet Oral BID   sertraline  100 mg Oral Daily   sodium chloride flush  10-40 mL Intracatheter Q12H   sodium chloride flush  3 mL Intravenous Q12H   sodium chloride flush  3 mL Intravenous Q12H   tamsulosin  0.4 mg Oral Daily   vitamin B-12  100 mcg Oral Daily    Infusions:  sodium chloride     amiodarone 30 mg/hr (04/02/21 2351)   heparin 1,650 Units/hr (04/03/21 0437)   milrinone 0.375 mcg/kg/min (04/03/21 0239)     PRN Medications: sodium chloride, acetaminophen **OR** acetaminophen, antiseptic oral rinse, docusate sodium, guaiFENesin, ondansetron **OR** ondansetron (ZOFRAN) IV, sodium chloride flush, sodium chloride flush    Patient Profile   Mr Rodda is a 65 year old with history of smoker, CAD s/p previous MI, HTN, HL, chronic HFeEF, sepsis 11/2020 bacteremia.  Admitted with sepsis. Bld Cx - NGTD    Assessment/Plan    1. Acute on chronic systolic HFr EF - Echo 08/12/33 EF 20-25% RV mildly HK. moderate AS  Mean gradient 13 AVA 1.2  cm2 DI 0.30 - RHC (7/22): EF 20%, low filling pressures. CI 2.3.  - RHC 11/16  RA 2, PA 65/27 (44), PCW 28, SVR 960, CO 6.5,  CI 3.2 , PA 57%. Milrinone was increased to 0.375 mcg.  - Co-ox 61% on milrinone 0.375 mcg. - CVP 4-5. Volume stable. Continue lasix 80  mg IV BID.  - No bb with low output HF - No ivabradine given AF   - Continue digoxin.  Dig level 11/2 0.3. Recheck this am pending.  - Continue midodrine 10 mg TID   - Discussed with [name] -  not a candidate for transplant.  - VAD with AVR planned for Friday  - Continue inotropic support, ambulation and aggressive nutrition interventions.   2. Sepsis - H/o strep bacteremia 11/2020.  ICD extracted 12/04/20. Barostim was not removed as it is extravascular.  - Received IV antibiotics until 01/15/21. Blood cultures negative 01/30/21..  - Procalcitonin 0.22->0.11 - 10/26 -Blood CX x2  Negative  - Ct chest concerning for pneumonia. Treated w/ cefepime/vancomycin -> Augmentin (stop 11/7) - Resolved  3. Acute Hypoxic Respiratory Failure  - CXR with pulmonary edema initially, and was diuresed with IV Lasix.  -  CT chest completed 10/30 concerning for bronchopneumonia. On augmentin.  - Improved/resolved.  - On 2 liters [place], O2 sats stable.    4. CAD  s/p anterior STEMI (12/20). LHC showed chronically occluded RCA (with L>>R collaterals) and thrombotic occlusion of proximal LAD. Underwent PCI of LAD. - LHC (7/22) with stable CAD.  - Chest pain this am not exertional. Tends to vary with position changes. Does not appear consistent with angina.  - Continue atorvastatin 80. No aspirin d/t anticoagulation.  5. Severe low-flow aortic stenosis - not candidate for TAVR due to presence of fibroelastoma - pending AVR at time of VAD   6. H/O RUE DVT - Off eliquis as noted above.  - On heparin drip. Hgb stable at 8.6 this am  7. Severe protein-calorie malnutrition - prealbumin 11>15> 13 despite adequate caloric intake on calorie count -  Pre-albumin dropping.  - Dietitian following with supplements adjusted.  - repeat calorie count completed and was adequate  8. Iron-deficiency anemia - Hgb stable at 8.6 - Rec'd Feraheme 11/8 and 11/13   9. Paroxysmal Atrial Fibrillation w/ RVR - new, developed 11/10. Now back in NSR   - Maintaining NSR. Continue amio gtt 30/hr while on milrinone - Off eliquis 11/20. Switched to heparin drip in anticipation of VAD. Hgb stable.  - Mag 2.0, K 3.8  - Keep K > 4.0 and Mg >2.0   10. Hypokalemia - K 3.8 - Supp    11. Hyponatremia - Sodium stable at 132 today  - restrict free water  Continue to ambulate and work on nutrition. VAD on Friday.   Length of Stay: [place], [place], PA-C  04/03/2021, 7:31 AM  Advanced Heart Failure Team Pager [phone] (M-F; 7a - 5p)  Please contact [facility] for night-coverage after hours (5p -7a ) and weekends on [email or web address]  Patient seen and examined with the above-signed Advanced Practice Provider and/or Housestaff. I personally reviewed laboratory data, imaging studies and relevant notes. I independently examined the patient and formulated the important aspects of the plan. I have edited the note to reflect any of my changes or salient points. I have personally discussed the plan with the patient and/or family.   Remains on milrinone and IV lasix. Volume status and co-ox stable. Continues to ambulate. Orthopnea improved. No PND or CP.  General:  Well appearing. No resp difficulty HEENT: normal Neck: supple. no JVD. Carotids 2+ bilat; no bruits. No lymphadenopathy or thryomegaly appreciated. Cor: PMI nondisplaced. Regular rate & rhythm. 2/6 AS Lungs: clear Abdomen: soft, nontender, nondistended. No hepatosplenomegaly. No bruits or masses. Good bowel sounds. Extremities: no cyanosis, clubbing,  rash, edema Neuro: alert & orientedx3, cranial nerves grossly intact. moves all 4 extremities w/o difficulty. Affect pleasant  Improved on milrinone  and IV lasix. AF quiescent on po amio. Now on IV heparin. No obvious bleeding. Plan for VAD Friday.   Glori Bickers, MD  5:51 PM

## 2021-04-03 NOTE — Progress Notes (Signed)
CARDIAC REHAB PHASE I   PRE:  Rate/Rhythm: 95 SR  BP:  Sitting: 97/61      SaO2: 98 2L  MODE:  Ambulation: 630 ft   (566ft in 6 min)   POST:  Rate/Rhythm: 118 ST  BP:  Sitting: 106/94    SaO2: 100 2L   6 minute walk test completed with pt. Pt able to ambulate 539ft in hallway pushing IV pole in 6 minutes, with slow, steady gait. Pt ambulated 669ft total, with mild SOB near end of walk. Vitals stable. Pt returned to recliner. Able to demonstrate ~1250 on IS. Encouraged continued ambulation and IS use. Will continue to follow.  [id number] Rufina Falco, RN BSN 04/03/2021 9:55 AM

## 2021-04-03 NOTE — Progress Notes (Signed)
Patient ID: [name], male   DOB: 1955-10-21, 65 y.o.   MRN: [id number]    Progress Note from the Palliative Medicine Team at [facility]   Patient Name: Brandon [name]        Date: 04/03/2021 DOB: Dec 08, 1955  Age: 65 y.o. MRN#: [id number] Attending Physician: Elmarie Shiley, MD Primary Care Physician: [name], MD Admit Date: 03/07/2021   Medical records reviewed   65 y.o. male   admitted on 03/07/2021 with past medical history significant of CAD with previous MI, chronic systolic CHF/ICM w/ [employer] [place] ICD that was extracted 7/22, moderate aortic stenosis, HTN, HLD, hx of recent right UE DVT who presents with sudden onset dyspnea.   Recent bacteremia with icd removal 7/22, s/p 6 weeks ceftriaxone.  Admitted with sepsis.  ID consulted.   Chronic Systolic HFr EF/ EF 98%, CAD.   Patient continues with full medical support in preparation/ anticipation of LVAD.  VAD scheduled for Friday     This NP visited patient at the bedside as a follow up for palliative medicine needs and emotional support.  Created space and opportunity for Brandon [name] to explore his thoughts and feelings regarding his current medical situation.    Brandon [name] remains optimistic, he is feeling "a little nervous" as the surgery day approaches.  He spoke to staying optimistic, and "thinking  positive".  Education offered regarding mind, body, spirit and utilization of his coping mechanisms.     He verbalizes appreciation for the visit.    Discussed with patient the importance of continued conversation with his family and the  medical providers regarding overall plan of care and treatment options,  ensuring decisions are within the context of the patients values and GOCs.  At patient's request I reached out to patient's sister/ Brandon [name]/ she is his main support person post LVAD.  Education of an expected trajectory post surgery.   Questions and concerns addressed   PMT will continue to support  holistically  Total time spent on the unit was 35 minutes  Greater than 50% of the time was spent in counseling and coordination of care  Wadie Lessen NP  Palliative Medicine Team Team Phone # [phone] Pager [phone]

## 2021-04-03 NOTE — Progress Notes (Signed)
Mobility Specialist Progress Note    04/03/21 1508  Mobility  Activity Ambulated in hall  Level of Assistance Modified independent, requires aide device or extra time  Assistive Device  (IV pole)  Distance Ambulated (ft) 510 ft  Mobility Ambulated independently in hallway  Mobility Response Tolerated well  Mobility performed by Mobility specialist  Bed Position Chair  $Mobility charge 1 Mobility   Pt received in chair and agreeable. Had successful void in BR. No complaints on walk. Ambulated on 2LO2 and took no breaks. Returned to chair with call bell in reach and sister present in room.   [facility] Mobility Specialist  M.S. Primary Phone: [phone] M.S. Secondary Phone: [phone]

## 2021-04-04 ENCOUNTER — Other Ambulatory Visit: Payer: Self-pay

## 2021-04-04 ENCOUNTER — Inpatient Hospital Stay (HOSPITAL_COMMUNITY): Payer: Medicare Other

## 2021-04-04 DIAGNOSIS — I509 Heart failure, unspecified: Secondary | ICD-10-CM

## 2021-04-04 DIAGNOSIS — I10 Essential (primary) hypertension: Secondary | ICD-10-CM | POA: Diagnosis not present

## 2021-04-04 DIAGNOSIS — J690 Pneumonitis due to inhalation of food and vomit: Secondary | ICD-10-CM | POA: Diagnosis not present

## 2021-04-04 LAB — HEPATIC FUNCTION PANEL
ALT: 26 U/L (ref 0–44)
AST: 21 U/L (ref 15–41)
Albumin: 2.7 g/dL — ABNORMAL LOW (ref 3.5–5.0)
Alkaline Phosphatase: 96 U/L (ref 38–126)
Bilirubin, Direct: <0.1 mg/dL (ref 0.0–0.2)
Total Bilirubin: 0.5 mg/dL (ref 0.3–1.2)
Total Protein: 7.2 g/dL (ref 6.5–8.1)

## 2021-04-04 LAB — APTT
aPTT: 47 seconds — ABNORMAL HIGH (ref 24–36)
aPTT: 77 seconds — ABNORMAL HIGH (ref 24–36)

## 2021-04-04 LAB — COOXEMETRY PANEL
Carboxyhemoglobin: 1.7 % — ABNORMAL HIGH (ref 0.5–1.5)
Methemoglobin: 0.6 % (ref 0.0–1.5)
O2 Saturation: 65.4 %
Total hemoglobin: 8.4 g/dL — ABNORMAL LOW (ref 12.0–16.0)

## 2021-04-04 LAB — BASIC METABOLIC PANEL
Anion gap: 8 (ref 5–15)
BUN: 28 mg/dL — ABNORMAL HIGH (ref 8–23)
CO2: 27 mmol/L (ref 22–32)
Calcium: 8.8 mg/dL — ABNORMAL LOW (ref 8.9–10.3)
Chloride: 94 mmol/L — ABNORMAL LOW (ref 98–111)
Creatinine, Ser: 0.79 mg/dL (ref 0.61–1.24)
GFR, Estimated: >60 mL/min (ref >60)
Glucose, Bld: 275 mg/dL — ABNORMAL HIGH (ref 70–99)
Potassium: 4 mmol/L (ref 3.5–5.1)
Sodium: 129 mmol/L — ABNORMAL LOW (ref 135–145)

## 2021-04-04 LAB — HEPARIN LEVEL (UNFRACTIONATED): Heparin Unfractionated: 0.34 IU/mL (ref 0.30–0.70)

## 2021-04-04 LAB — CBC
HCT: 29.7 % — ABNORMAL LOW (ref 39.0–52.0)
Hemoglobin: 8.9 g/dL — ABNORMAL LOW (ref 13.0–17.0)
MCH: 24.6 pg — ABNORMAL LOW (ref 26.0–34.0)
MCHC: 30 g/dL (ref 30.0–36.0)
MCV: 82 fL (ref 80.0–100.0)
Platelets: 321 10*3/uL (ref 150–400)
RBC: 3.62 MIL/uL — ABNORMAL LOW (ref 4.22–5.81)
RDW: 20.2 % — ABNORMAL HIGH (ref 11.5–15.5)
WBC: 12 10*3/uL — ABNORMAL HIGH (ref 4.0–10.5)
nRBC: 0 % (ref 0.0–0.2)

## 2021-04-04 LAB — MAGNESIUM: Magnesium: 2.1 mg/dL (ref 1.7–2.4)

## 2021-04-04 MED ORDER — VANCOMYCIN HCL 1250 MG/250ML IV SOLN
1250.0000 mg | INTRAVENOUS | Status: DC
  Filled 2021-04-04: qty 250

## 2021-04-04 MED ORDER — TRANEXAMIC ACID 1000 MG/10ML IV SOLN
1.5000 mg/kg/h | INTRAVENOUS | Status: AC
  Administered 2021-04-06: 1.5 mg/kg/h via INTRAVENOUS
  Filled 2021-04-04: qty 25

## 2021-04-04 MED ORDER — PLASMA-LYTE A IV SOLN
INTRAVENOUS | Status: DC
  Filled 2021-04-04: qty 5

## 2021-04-04 MED ORDER — HEPARIN 30,000 UNITS/1000 ML (OHS) CELLSAVER SOLUTION
Status: DC
  Filled 2021-04-04: qty 1000

## 2021-04-04 MED ORDER — POTASSIUM CHLORIDE 2 MEQ/ML IV SOLN
80.0000 meq | INTRAVENOUS | Status: DC
  Filled 2021-04-04: qty 40

## 2021-04-04 MED ORDER — TRANEXAMIC ACID 1000 MG/10ML IV SOLN
1.5000 mg/kg/h | INTRAVENOUS | Status: DC
  Filled 2021-04-04: qty 25

## 2021-04-04 MED ORDER — INSULIN REGULAR(HUMAN) IN NACL 100-0.9 UT/100ML-% IV SOLN
INTRAVENOUS | Status: DC
  Filled 2021-04-04: qty 100

## 2021-04-04 MED ORDER — VASOPRESSIN 20 UNITS/100 ML INFUSION FOR SHOCK
0.0400 [IU]/min | INTRAVENOUS | Status: AC
  Administered 2021-04-06: .03 [IU]/min via INTRAVENOUS
  Filled 2021-04-04: qty 100

## 2021-04-04 MED ORDER — PHENYLEPHRINE HCL-NACL 20-0.9 MG/250ML-% IV SOLN
0.0000 ug/min | INTRAVENOUS | Status: DC
Start: 2021-04-06 — End: 2021-04-06
  Filled 2021-04-04: qty 250

## 2021-04-04 MED ORDER — TRANEXAMIC ACID (OHS) PUMP PRIME SOLUTION
2.0000 mg/kg | INTRAVENOUS | Status: DC
  Filled 2021-04-04: qty 1.57

## 2021-04-04 MED ORDER — NOREPINEPHRINE 4 MG/250ML-% IV SOLN
0.0000 ug/min | INTRAVENOUS | Status: AC
  Administered 2021-04-06: 2 ug/min via INTRAVENOUS
  Administered 2021-04-06: 30 ug/min via INTRAVENOUS
  Administered 2021-04-06: 4 ug/min via INTRAVENOUS
  Filled 2021-04-04: qty 250

## 2021-04-04 MED ORDER — MANNITOL 20 % IV SOLN
INTRAVENOUS | Status: DC
  Filled 2021-04-04: qty 13

## 2021-04-04 MED ORDER — VANCOMYCIN HCL 1250 MG/250ML IV SOLN
1250.0000 mg | INTRAVENOUS | Status: AC
  Administered 2021-04-06: 1250 mg via INTRAVENOUS
  Filled 2021-04-04: qty 250

## 2021-04-04 MED ORDER — MILRINONE LACTATE IN DEXTROSE 20-5 MG/100ML-% IV SOLN
0.3000 ug/kg/min | INTRAVENOUS | Status: AC
  Administered 2021-04-06: .375 ug/kg/min via INTRAVENOUS
  Filled 2021-04-04: qty 100

## 2021-04-04 MED ORDER — SORBITOL 70 % SOLN
30.0000 mL | Freq: Once | Status: AC
Start: 2021-04-04 — End: 2021-04-04
  Administered 2021-04-04: 30 mL via ORAL
  Filled 2021-04-04: qty 30

## 2021-04-04 MED ORDER — DEXMEDETOMIDINE HCL IN NACL 400 MCG/100ML IV SOLN
0.1000 ug/kg/h | INTRAVENOUS | Status: DC
Start: 2021-04-06 — End: 2021-04-04
  Filled 2021-04-04: qty 100

## 2021-04-04 MED ORDER — TRANEXAMIC ACID (OHS) BOLUS VIA INFUSION
15.0000 mg/kg | INTRAVENOUS | Status: DC
  Filled 2021-04-04: qty 1181

## 2021-04-04 MED ORDER — CEFAZOLIN SODIUM-DEXTROSE 2-4 GM/100ML-% IV SOLN
2.0000 g | INTRAVENOUS | Status: AC
  Administered 2021-04-06 (×2): 2 g via INTRAVENOUS
  Filled 2021-04-04: qty 100

## 2021-04-04 MED ORDER — MAGNESIUM HYDROXIDE 400 MG/5ML PO SUSP
30.0000 mL | Freq: Once | ORAL | Status: AC
  Administered 2021-04-04: 30 mL via ORAL
  Filled 2021-04-04: qty 30

## 2021-04-04 MED ORDER — INSULIN REGULAR(HUMAN) IN NACL 100-0.9 UT/100ML-% IV SOLN
INTRAVENOUS | Status: AC
  Administered 2021-04-06: 1.1 [IU]/h via INTRAVENOUS
  Filled 2021-04-04: qty 100

## 2021-04-04 MED ORDER — FLUCONAZOLE IN SODIUM CHLORIDE 400-0.9 MG/200ML-% IV SOLN
400.0000 mg | INTRAVENOUS | Status: AC
  Administered 2021-04-06: 400 mg via INTRAVENOUS
  Filled 2021-04-04: qty 200

## 2021-04-04 MED ORDER — MAGNESIUM SULFATE 50 % IJ SOLN: 40.0000 meq | INTRAMUSCULAR | Status: DC

## 2021-04-04 MED ORDER — NOREPINEPHRINE 4 MG/250ML-% IV SOLN
0.0000 ug/min | INTRAVENOUS | Status: DC
  Filled 2021-04-04: qty 250

## 2021-04-04 MED ORDER — EPINEPHRINE HCL 5 MG/250ML IV SOLN IN NS
0.0000 ug/min | INTRAVENOUS | Status: AC
  Administered 2021-04-06: 3 ug/min via INTRAVENOUS
  Filled 2021-04-04: qty 250

## 2021-04-04 MED ORDER — VANCOMYCIN HCL 1 G IV SOLR
1000.0000 mg | INTRAVENOUS | Status: DC
  Filled 2021-04-04: qty 20

## 2021-04-04 MED ORDER — SODIUM CHLORIDE 0.9 % IV SOLN
600.0000 mg | INTRAVENOUS | Status: AC
  Administered 2021-04-06: 600 mg via INTRAVENOUS
  Filled 2021-04-04: qty 600

## 2021-04-04 MED ORDER — MILRINONE LACTATE IN DEXTROSE 20-5 MG/100ML-% IV SOLN
0.3000 ug/kg/min | INTRAVENOUS | Status: DC
  Filled 2021-04-04: qty 100

## 2021-04-04 MED ORDER — DOBUTAMINE IN D5W 4-5 MG/ML-% IV SOLN
2.0000 ug/kg/min | INTRAVENOUS | Status: DC
Start: 2021-04-06 — End: 2021-04-06
  Filled 2021-04-04: qty 250

## 2021-04-04 MED ORDER — DOPAMINE-DEXTROSE 3.2-5 MG/ML-% IV SOLN
0.0000 ug/kg/min | INTRAVENOUS | Status: DC
Start: 2021-04-06 — End: 2021-04-06
  Filled 2021-04-04: qty 250

## 2021-04-04 MED ORDER — CEFAZOLIN SODIUM-DEXTROSE 2-4 GM/100ML-% IV SOLN
2.0000 g | INTRAVENOUS | Status: DC
  Filled 2021-04-04: qty 100

## 2021-04-04 MED ORDER — DEXMEDETOMIDINE HCL IN NACL 400 MCG/100ML IV SOLN
0.1000 ug/kg/h | INTRAVENOUS | Status: AC
  Administered 2021-04-06: .7 ug/kg/h via INTRAVENOUS
  Filled 2021-04-04: qty 100

## 2021-04-04 MED ORDER — EPINEPHRINE HCL 5 MG/250ML IV SOLN IN NS
0.0000 ug/min | INTRAVENOUS | Status: DC
  Filled 2021-04-04: qty 250

## 2021-04-04 MED ORDER — TRANEXAMIC ACID (OHS) BOLUS VIA INFUSION
15.0000 mg/kg | INTRAVENOUS | Status: AC
  Administered 2021-04-06: 1180.5 mg via INTRAVENOUS
  Filled 2021-04-04: qty 1181

## 2021-04-04 MED ORDER — FUROSEMIDE 10 MG/ML IJ SOLN
80.0000 mg | Freq: Once | INTRAMUSCULAR | Status: AC
  Administered 2021-04-04: 80 mg via INTRAVENOUS
  Filled 2021-04-04: qty 8

## 2021-04-04 MED ORDER — PHENYLEPHRINE HCL-NACL 20-0.9 MG/250ML-% IV SOLN
30.0000 ug/min | INTRAVENOUS | Status: DC
  Filled 2021-04-04: qty 250

## 2021-04-04 MED ORDER — NITROGLYCERIN IN D5W 200-5 MCG/ML-% IV SOLN
0.0000 ug/min | INTRAVENOUS | Status: DC
  Filled 2021-04-04: qty 250

## 2021-04-04 MED ORDER — NITROGLYCERIN IN D5W 200-5 MCG/ML-% IV SOLN
2.0000 ug/min | INTRAVENOUS | Status: DC
  Filled 2021-04-04: qty 250

## 2021-04-04 NOTE — Progress Notes (Signed)
CARDIAC REHAB PHASE I   Offered to walk with pt. Pt states he had a rough night and morning trying to get his backed up bowels to move. Provided support and encouragement. Pt voices concerns about upcoming surgery, but is thankful for his team and feels likes he is in good hands. Encouraged continued ambulation and nutrition. Will continue to follow pt throughout his hospital stay.  [id number] Rufina Falco, RN BSN 04/04/2021 11:49 AM

## 2021-04-04 NOTE — Progress Notes (Signed)
Mobility Specialist Progress Note    04/04/21 1500  Mobility  Activity Ambulated in hall  Level of Assistance Modified independent, requires aide device or extra time  Assistive Device  (IV pole)  Distance Ambulated (ft) 510 ft  Mobility Ambulated independently in hallway  Mobility Response Tolerated well  Mobility performed by Mobility specialist  Bed Position Chair  $Mobility charge 1 Mobility   Pt received in chair and agreeable. C/o feeling a little tired from not sleeping a lot last night and also having a little SOB. Returned to chair with call bell in reach.   [facility] Mobility Specialist  M.S. Primary Phone: [phone] M.S. Secondary Phone: [phone]

## 2021-04-04 NOTE — Progress Notes (Addendum)
Inpatient Diabetes Program Recommendations  AACE/ADA: New Consensus Statement on Inpatient Glycemic Control (2015)  Target Ranges:  Prepandial:   less than 140 mg/dL      Peak postprandial:   less than 180 mg/dL (1-2 hours)      Critically ill patients:  140 - 180 mg/dL   Lab Results  Component Value Date   GLUCAP 106 (H) 12/11/2020   HGBA1C 6.4 (H) 11/30/2020    Review of Glycemic Control  Latest Reference Range & Units 04/04/21 04:19  Glucose 70 - 99 mg/dL 275 (H)   Diabetes history: None noted- last A1C=6.4% Outpatient Diabetes medications: Farxiga 10 mg daily Current orders for Inpatient glycemic control:  IV insulin for surgery on 04/06/21  Inpatient Diabetes Program Recommendations:    Consider adding Novolog sensitive tid with meals and HS.   Thanks,  Adah Perl, RN, BC-ADM Inpatient Diabetes Coordinator Pager [phone]  (8a-5p)

## 2021-04-04 NOTE — Plan of Care (Signed)
  Problem: Clinical Measurements: Goal: Will remain free from infection Outcome: Progressing   Problem: Coping: Goal: Level of anxiety will decrease Outcome: Progressing   

## 2021-04-04 NOTE — Progress Notes (Signed)
Pt had  c/o chest discomfort last night , dr informed  xray done , ekg and labs , awaiting results.

## 2021-04-04 NOTE — Progress Notes (Addendum)
[facility]  PROGRESS NOTE  Brandon [name] UVO:[id number] DOB: 1956/04/10 DOA: 03/07/2021 PCP: [name], MD   Brief HPI:   65 year old male with past medical history of coronary artery disease, chronic systolic heart failure with AICD extracted on 12/01/2020 due to infection, completed antibiotics on 01/15/2021 with Rocephin, moderate aortic stenosis, right upper extremity DVT came to ED with acute onset of dyspnea.  Was found to be hypoxemic, placed on 2 L/min of oxygen found to be in acute decompensated heart failure.  Cardiology was consulted.  Significant Events: 10/29 Co-ox 48%, started on milrinone 0.25.  10/30 IV Antibiotics stopped. Blood cultures no growth for 5 days.  10/31 started on augmentin. Milrinone cut back to 0.125 mcg.  03/12/2021 started on midodrine  03/24/2021 started back on milrinone   Antibiotics: 03/07/2021 IV Vanco and cefepime till 03/10/2021 03/11/2021 Augmentin  Microbiology data: 03/07/2021 blood culture: Remain negative till date   Procedures: None  Subjective   Patient seen and examined, complains of bloating this morning.  Abdominal x-ray showed nonspecific bowel gas pattern and possibility of distended fluid-filled stomach or space-occupying lesion.  Patient was given sorbitol this morning and has had large bowel movement today.     Assessment/Plan:   Pneumonia/sepsis -Initially started on vancomycin and cefepime which was stopped on 10/30 -High-resolution CT scan of the chest showed bronchopneumonia, so ID started him back on Augmentin for 7 more days -Completed Augmentin course on 11/7 -WBC is normal   Acute respiratory failure with hypoxemia due to acute on chronic systolic heart failure -Last 2D echo in July 2022 showed EF 20% -Currently on ivabradine,  digoxin, midodrine, milrinone -Underwent right heart cardiac cath on 11/16 -Cardiology has been treating his CHF with IV Lasix -Not a transplant candidate  per [name], as per  cards note -Plan for LVAD is 11/25  Dyslipidemia -Continue atorvastatin  Right upper extremity DVT with neuropathy -Continue gabapentin -Apixaban was transitioned to heparin on 11/20  Depression -Continue sertraline  BPH -Continue Flomax  Protein calorie malnutrition -Continue Ensure 3 times a day -dietitian consulted  Severe low-flow aortic stenosis -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 fib  -Started on amiodarone infusion -Converted back to normal sinus rhythm -Patient is on anticoagulation with heparin  Hypokalemia -Replete  Hyponatremia -Today sodium is 129 -Likely in the setting of CHF exacerbation -Also on diuretics -We will closely monitor patient's sodium level.  Anemia -Due to iron-deficiency -Received IV iron x2  Anxiety -Patient on scheduled Klonopin 0.25 mg p.o. twice daily  Constipation -Resolved after patient received sorbitol this morning -Repeat abdominal x-ray shows nonobstructive bowel gas pattern   Scheduled medications:    (feeding supplement) PROSource Plus  30 mL Oral QID   atorvastatin  80 mg Oral QPM   Chlorhexidine Gluconate Cloth  6 each Topical Daily   clonazepam  0.25 mg Oral BID   digoxin  0.125 mg Oral Daily   diphenhydrAMINE  50 mg Oral QHS   [START ON 04/06/2021] epinephrine  0-10 mcg/min Intravenous To OR   ezetimibe  10 mg Oral Daily   feeding supplement  237 mL Oral TID BM   folic acid  1 mg Oral Daily   gabapentin  200 mg Oral TID   [START ON 04/06/2021] insulin   Intravenous To OR   [START ON 04/06/2021] [place] Blood Cardioplegia vial (lidocaine/magnesium/mannitol 0.26g-4g-6.4g)   Intracoronary To OR  lidocaine  1 patch Transdermal Q24H   mouth rinse  15 mL Mouth Rinse BID   melatonin  5 mg Oral QHS   midodrine  10 mg Oral TID WC   multivitamin with minerals  1 tablet Oral Daily   pantoprazole  40 mg  Oral Daily   [START ON 04/06/2021] phenylephrine  0-100 mcg/min Intravenous To OR   polyethylene glycol  17 g Oral Daily   polyvinyl alcohol  1 drop Both Eyes BID   [START ON 04/06/2021] potassium chloride  80 mEq Other To OR   senna  2 tablet Oral BID   sertraline  100 mg Oral Daily   sodium chloride flush  10-40 mL Intracatheter Q12H   sodium chloride flush  3 mL Intravenous Q12H   sodium chloride flush  3 mL Intravenous Q12H   tamsulosin  0.4 mg Oral Daily   [START ON 04/06/2021] tranexamic acid  15 mg/kg Intravenous To OR   [START ON 04/06/2021] tranexamic acid  2 mg/kg Intracatheter To OR   [START ON 04/06/2021] vancomycin  1,000 mg Other To OR   [START ON 04/06/2021] vasopressin  0.04 Units/min Intravenous To OR   vitamin B-12  100 mcg Oral Daily     Data Reviewed:   CBG:  No results for input(s): GLUCAP in the last 168 hours.  SpO2: 96 % O2 Flow Rate (L/min): 2 L/min    Vitals:   04/04/21 0449 04/04/21 0500 04/04/21 0812 04/04/21 1106  BP:   110/75 100/66  Pulse:  (!) 101 (!) 104 (!) 108  Resp:  (!) 22 20 20   Temp:   (!) 97.4 F (36.3 C) 97.9 F (36.6 C)  TempSrc:   Oral Oral  SpO2:  99% 100% 96%  Weight: 78.7 kg     Height:         Intake/Output Summary (Last 24 hours) at 04/04/2021 1626 Last data filed at 04/04/2021 1300 Gross per 24 hour  Intake 1140 ml  Output 1905 ml  Net -765 ml    11/21 1901 - 11/23 0700 In: 1546.1 [P.O.:600; I.V.:946.1] Out: 5005 [Urine:5005]  Filed Weights   04/01/21 0500 04/03/21 0447 04/04/21 0449  Weight: 77.1 kg 77.9 kg 78.7 kg    Data Reviewed: Basic Metabolic Panel: Recent Labs  Lab 03/31/21 0400 04/01/21 0544 04/01/21 0656 04/01/21 1402 04/01/21 2224 04/02/21 0430 04/03/21 0422 04/04/21 0419  NA 133* 133*  --  130* 131* 132* 132* 129*  K 3.4* 2.5*   < > 2.8* 3.8 3.6 3.8 4.0  CL 91* 91*  --  89* 96* 97* 96* 94*  CO2 34* 33*  --  31 28 28 26 27   GLUCOSE 100* 100*  --  241* 127* 104* 116* 275*  BUN 22 23   --  21 25* 24* 30* 28*  CREATININE 0.71 0.80  --  0.74 0.77 0.75 0.77 0.79  CALCIUM 8.9 8.7*  --  8.7* 8.8* 8.8* 9.3 8.8*  MG 1.8 2.1  --   --   --  2.1 2.0 2.1  PHOS  --   --   --  4.0  --   --   --   --    < > = values in this interval not displayed.   Liver Function Tests: Recent Labs  Lab 04/04/21 0502  AST 21  ALT 26  ALKPHOS 96  BILITOT 0.5  PROT 7.2  ALBUMIN 2.7*   No results for input(s): LIPASE, AMYLASE in the last 168 hours.  No results for input(s): AMMONIA in the last 168 hours. CBC: Recent Labs  Lab 03/31/21 0400 04/01/21 0405 04/02/21 0430 04/03/21 0422 04/04/21 0859  WBC 10.1 10.1 10.0 9.8 12.0*  HGB 8.2* 8.6* 8.1* 8.6* 8.9*  HCT 27.1* 28.4* 27.3* 28.2* 29.7*  MCV 80.9 81.1 83.7 81.5 82.0  PLT 326 344 320 290 321   Cardiac Enzymes: No results for input(s): CKTOTAL, CKMB, CKMBINDEX, TROPONINI in the last 168 hours. BNP (last 3 results) Recent Labs    11/30/20 0144 01/08/21 0731 01/18/21 1629  BNP 2,653.1* 734.2* 607.9*    ProBNP (last 3 results) No results for input(s): PROBNP in the last 8760 hours.   CBG: No results for input(s): GLUCAP in the last 168 hours.     Radiology Reports  DG Abd 1 View  Result Date: 04/04/2021 CLINICAL DATA:  Abdominal pain EXAM: ABDOMEN - 1 VIEW COMPARISON:  Study done earlier today FINDINGS: Bowel gas pattern is nonspecific. Moderate amount of gas is seen in the colon. There is no evidence of fecal impaction in the rectosigmoid. No abnormal calcifications are seen. Surgical fusion is seen at L5-S1 level. Diffuse calcifications in the spinal ligaments and possible fusion of SI joints suggest ankylosing spondylitis. IMPRESSION: Nonspecific bowel gas pattern. Electronically Signed   By: Elmer Picker M.D.   On: 04/04/2021 16:15   DG Abd 1 View  Result Date: 04/04/2021 CLINICAL DATA:  65 year old male with history of abdominal pain. EXAM: ABDOMEN - 1 VIEW COMPARISON:  Abdominal radiograph 11/30/2020.  FINDINGS: There is a paucity of bowel gas in the epigastric region and central abdomen, which could indicate a space occupying lesion or fluid-filled distended stomach. No pathologic dilatation of small bowel or colon. Gas and stool are noted throughout the colon and distal rectum. Rod and screw fixation hardware in the lower lumbar spine. IMPRESSION: 1. Unusual but nonobstructive bowel gas pattern. The possibility of a distended fluid-filled stomach or space-occupying lesion in the upper abdomen warrants consideration. Further evaluation with CT the abdomen and pelvis with IV contrast should be considered if clinically appropriate. Electronically Signed   By: Vinnie Langton M.D.   On: 04/04/2021 05:02       Antibiotics: Anti-infectives (From admission, onward)    Start     Dose/Rate Route Frequency Ordered Stop   04/06/21 0400  vancomycin (VANCOREADY) IVPB 1250 mg/250 mL  Status:  Discontinued        1,250 mg 166.7 mL/hr over 90 Minutes Intravenous To Surgery 04/04/21 0904 04/04/21 1412   04/06/21 0400  ceFAZolin (ANCEF) IVPB 2g/100 mL premix  Status:  Discontinued        2 g 200 mL/hr over 30 Minutes Intravenous To Surgery 04/04/21 0904 04/04/21 1412   04/06/21 0400  ceFAZolin (ANCEF) IVPB 2g/100 mL premix  Status:  Discontinued        2 g 200 mL/hr over 30 Minutes Intravenous To Surgery 04/04/21 0904 04/04/21 1412   04/06/21 0400  vancomycin (VANCOREADY) IVPB 1250 mg/250 mL        1,250 mg 166.7 mL/hr over 90 Minutes Intravenous To Surgery 04/04/21 1418 04/07/21 0400   04/06/21 0400  ceFAZolin (ANCEF) IVPB 2g/100 mL premix        2 g 200 mL/hr over 30 Minutes Intravenous To Surgery 04/04/21 1418 04/07/21 0400   04/06/21 0400  ceFAZolin (ANCEF) IVPB 2g/100 mL premix        2 g 200 mL/hr over 30 Minutes Intravenous To Surgery 04/04/21 1418 04/07/21 0400  04/06/21 0400  fluconazole (DIFLUCAN) IVPB 400 mg        400 mg 100 mL/hr over 120 Minutes Intravenous To Surgery 04/04/21 1418  04/07/21 0400   04/06/21 0400  rifampin (RIFADIN) 600 mg in sodium chloride 0.9 % 100 mL IVPB        600 mg 200 mL/hr over 30 Minutes Intravenous To Surgery 04/04/21 1418 04/07/21 0400   04/06/21 0400  vancomycin (VANCOCIN) powder 1,000 mg        1,000 mg Other To Surgery 04/04/21 1418 04/07/21 0400   03/12/21 1000  amoxicillin-clavulanate (AUGMENTIN) 875-125 MG per tablet 1 tablet        1 tablet Oral Every 12 hours 03/12/21 0832 03/19/21 0935   03/08/21 0700  vancomycin (VANCOREADY) IVPB 1250 mg/250 mL  Status:  Discontinued        1,250 mg 166.7 mL/hr over 90 Minutes Intravenous Every 12 hours 03/07/21 1801 03/09/21 1320   03/08/21 0000  ceFEPIme (MAXIPIME) 2 g in sodium chloride 0.9 % 100 mL IVPB  Status:  Discontinued        2 g 200 mL/hr over 30 Minutes Intravenous Every 8 hours 03/07/21 1800 03/10/21 1201   03/07/21 1500  vancomycin (VANCOREADY) IVPB 1500 mg/300 mL        1,500 mg 150 mL/hr over 120 Minutes Intravenous  Once 03/07/21 1455 03/08/21 0118   03/07/21 1500  ceFEPIme (MAXIPIME) 2 g in sodium chloride 0.9 % 100 mL IVPB        2 g 200 mL/hr over 30 Minutes Intravenous  Once 03/07/21 1455 03/07/21 1628         DVT prophylaxis: Apixaban  Code Status: Full code  Family Communication: No family at bedside   Consultants: Cardiology  Procedures:     Objective    Physical Examination:  General-appears in no acute distress Heart-S1-S2, regular, no murmur auscultated Lungs-clear to auscultation bilaterally, no wheezing or crackles auscultated Abdomen-soft, nontender, no organomegaly Extremities-no edema in the lower extremities Neuro-alert, oriented x3, no focal deficit noted  Status is: Inpatient  Dispo: The patient is from: Home              Anticipated d/c is to: Home              Anticipated d/c date is: 04/06/2021              Patient currently not stable for discharge  Barrier to discharge-ongoing treatment of acute systolic heart  failure  [id number] Labs  No results for input(s): DDIMER, FERRITIN, LDH, CRP in the last 72 hours.  Lab Results  Component Value Date   SARSCOV2NAA NEGATIVE 03/07/2021   [place] NEGATIVE 01/08/2021   [place] NEGATIVE 12/04/2020   [place] NEGATIVE 11/14/2020        [place]   [facility] If 7PM-7AM, please contact night-coverage at [email or web address], Office  [phone]   04/04/2021, 4:26 PM  LOS: 27 days

## 2021-04-04 NOTE — Progress Notes (Addendum)
Brandon [name] has been discussed with the VAD Medical Review board on 04/02/21. The team feels as if the patient is a good candidate for Destination LVAD therapy. Patient is not a candidate for transplant due to recurrent infections. The patient meets criteria for a LVAD implant as listed below:  1) NYHA Class: ___IV___ documented on ____11/24/22______(date)  2) Has a left ventricular ejection fraction (LVEF) < 25%   *EF__20-25%___ by echo (date) __11/8/22___  3) Must meet one of the following:   Is inotrope dependent   *On inotropes___Milrinone 0.375 mcg/kg/min__started___10/29/22_____  OR  Has a Cardiac Index (CI) < 2.2  L/min/m2 while not on inotropes:   On inotropes.   4) Must meet one of the following:   ___X___ Is on optimal medical management (OMM), based on current heart failure practice guidelines for at least 52 of the last 60 days and are failing to respond   __X____ Has advanced heart failure for at least 14 days and are dependent on an intra-aortic balloon pump (IABP) or similar temporary mechanical circulatory support for at least 7 days      ____ IABP inserted (date) ____      ____ Impella inserted (date) ____  5)  Social work and palliative care evaluations demonstrate appropriate support system in place for discharge to home with a VAD and that end of life discussions have taken place. Both services have expressed no concern regarding patient's candidacy.         *Social work consult (date): 02/15/21 Brandon Sours, LCSW        *Palliative Care Consult (date): 03/14/21 Brandon Lessen, NP  6)  Primary caretaker identified that can be taught along with the patient how to manage        the VAD equipment.        *Name: Brandon [name] - sister  76)  Deemed appropriate by our financial coordinator: 04/02/21        Prior approval: no PA per Brandon [name]  8)  VAD Coordinator, Brandon [name] has met with patient and caregiver, shown them the VAD equipment and discussed with the  patient and caregiver about lifestyle changes necessary for success on mechanical circulatory device.        *Met with Brandon [name] and sister on 12/18/20.       *Consent for VAD Evaluation/Caregiver Agreement/HIPPA Release/Photo Release signed on 02/06/21   9)  Six Minute Walk:  535f   10) KCCQ Pre VAD:  [facility]  KCCQ-12   04/02/21    1 a. Ability to shower/bathe Not limited   1 b. Ability to walk 1 block Slightly limited   1 c. Ability to hurry/jog Quite a bit   2. Edema feet/ankles/legs Less than once a week   3. Limited by fatigue At least once a day   4. Limited by dyspnea At least once a day   5. Sitting up / on 3+ pillows 1-2 times per week   6. Limited enjoyment of life Quite a bit   7. Rest of life w/ symptoms Mostly dissatisfied   8 a. Participation in hobbies Quite a bit   8 b. Participation in chores Quite a bit   8 c. Visiting family/friends Quite a bit       11)  Intermacs profile: 2  INTERMACS 1: Critical cardiogenic shock describes a patient who is "crashing and burning", in which a patient has life-threatening hypotension and rapidly escalating inotropic pressor support, with critical organ hypoperfusion  often confirmed by worsening acidosis and lactate levels.  INTERMACS 2: Progressive decline describes a patient who has been demonstrated "dependent" on inotropic support but nonetheless shows signs of continuing deterioration in nutrition, renal function, fluid retention, or other major status indicator. Patient profile 2 can also describe a patient with refractory volume overload, perhaps with evidence of impaired perfusion, in whom inotropic infusions cannot be maintained due to tachyarrhythmias, clinical ischemia, or other intolerance.  INTERMACS 3: Stable but inotrope dependent describes a patient who is clinically stable on mild-moderate doses of intravenous inotropes (or has a temporary circulatory support device) after  repeated documentation of failure to wean without symptomatic hypotension, worsening symptoms, or progressive organ dysfunction (usually renal). It is critical to monitor nutrition, renal function, fluid balance, and overall status carefully in order to distinguish between a   patient who is truly stable at Patient Profile 3 and a patient who has unappreciated decline rendering them Patient Profile 2. This patient may be either at home or in the hospital.      INTERMACS 4: Resting symptoms describes a patient who is at home on oral therapy but frequently has symptoms of congestion at rest or with activities of daily living (ADL). He or she may have orthopnea, shortness of breath during ADL such as dressing or bathing, gastrointestinal symptoms (abdominal discomfort, nausea, poor appetite), disabling ascites or severe lower extremity edema. This patient should be carefully considered for more intensive management and surveillance programs, which may in some cases, reveal poor compliance that would compromise outcomes with any therapy.   .   INTERMACS 5: Exertion Intolerant describes a patient who is comfortable at rest but unable to engage in any activity, living predominantly within the house or housebound. This patient has no congestive symptoms, but may have chronically elevated volume status, frequently with renal dysfunction, and may be characterized as exercise intolerant.      INTERMACS 6: Exertion Limited also describes a patient who is comfortable at rest without evidence of fluid overload, but who is able to do some mild activity. Activities of daily living are comfortable and minor activities outside the home such as visiting friends or going to a restaurant can be performed, but fatigue results within a few minutes of any meaningful physical exertion. This patient has occasional episodes of worsening symptoms and is likely to have had a hospitalization for heart failure within the past year.    INTERMACS 7: Advanced NYHA Class 3 describes a patient who is clinically stable with a reasonable level of comfortable activity, despite history of previous decompensation that is not recent. This patient is usually able to walk more than a block. Any decompensation requiring intravenous diuretics or hospitalization within the previous month should make this person a Patient Profile 6 or lower.     Tanda Rockers RN, BSN VAD Coordinator 24/7 Pager [phone]

## 2021-04-04 NOTE — Progress Notes (Addendum)
Patient ID: SYRE KNERR, male   DOB: 27-Apr-1956, 65 y.o.   MRN: [id number]     Advanced Heart Failure Rounding Note  PCP-Cardiologist: [name], MD   Subjective:    Admitted with sepsis. Started on cefepime + vanc, now off abx (ID following). Afebrile.  10/27 started on ivabradine 7.5 mg twice a day and midodrine 5 mg TID 10/29 Co-ox 48%, started on milrinone 0.25.  10/30 IV Antibiotics stopped. Blood cultures no growth for 5 days.  10/31 started on Augmentin for PNA.  11/1 Milrinone off 11/2 Milrinone added back. Midodrine increased to 10 mg TID 11/8 Placed on 80 mg po lasix  11/10 Developed Afib w/ RVR, converted with IV amio 11/13 Give 250 cc NS bolus for low CVP.  11/14 Milrinone increased to 0.25 mcg. Diuresed with IV lasix  11/16 Had RHC with elevated left sided filling pressures PCWP 28, PA sat 57%, and normal cardiac output. Milrinone was increased to 0.375 mcg. Continued to diurese with IV lasix.   Co-ox 65% this am on 0.375 milrinone  Continues on IV lasix. Scr stable. Weight up 2 lb. -2.7L yesterday.   Reported increased abdominal pain last night. Feels like he was constipated. Had 2 bowel movements yesterday and 1 so far this am.   Abdominal xray this am with evidence of nonobstructive bowel gas pattern. Possibility of distended fluid-filled stomach or space occupying lesion in upper abdomen.   Objective:   Weight Range: 78.7 kg Body mass index is 23.53 kg/m.   Vital Signs:   Temp:  [97.7 F (36.5 C)-98.3 F (36.8 C)] 98.3 F (36.8 C) (11/23 0321) Pulse Rate:  [95-102] 101 (11/23 0500) Resp:  [17-25] 22 (11/23 0500) BP: (103-109)/(61-74) 104/72 (11/23 0321) SpO2:  [98 %-100 %] 99 % (11/23 0500) Weight:  [78.7 kg] 78.7 kg (11/23 0449) Last BM Date: 04/01/21  Weight change: Filed Weights   04/01/21 0500 04/03/21 0447 04/04/21 0449  Weight: 77.1 kg 77.9 kg 78.7 kg    Intake/Output:   Intake/Output Summary (Last 24 hours) at 04/04/2021 0727 Last  data filed at 04/04/2021 0341 Gross per 24 hour  Intake 600 ml  Output 3305 ml  Net -2705 ml     Physical Exam   General:  Well appearing. No resp difficulty. On 2L O2 [place] HEENT: normal Neck: supple. no JVD. Carotids 2+ bilat; no bruits.  Cor: PMI nondisplaced. Regular rate & rhythm, mildly tachy. No rubs, gallops, 3/6 SEM  Lungs: clear Abdomen: soft, nontender, nondistended. No hepatosplenomegaly.  Extremities: no cyanosis, clubbing, rash, edema Neuro: alert & orientedx3, cranial nerves grossly intact. moves all 4 extremities w/o difficulty. Affect pleasant    Telemetry  SR/Sinus tach, 90s-100s   Labs    CBC Recent Labs    04/02/21 0430 04/03/21 0422  WBC 10.0 9.8  HGB 8.1* 8.6*  HCT 27.3* 28.2*  MCV 83.7 81.5  PLT 320 540   Basic Metabolic Panel Recent Labs    04/01/21 1402 04/01/21 2224 04/03/21 0422 04/04/21 0419  NA 130*   < > 132* 129*  K 2.8*   < > 3.8 4.0  CL 89*   < > 96* 94*  CO2 31   < > 26 27  GLUCOSE 241*   < > 116* 275*  BUN 21   < > 30* 28*  CREATININE 0.74   < > 0.77 0.79  CALCIUM 8.7*   < > 9.3 8.8*  MG  --    < > 2.0 2.1  PHOS 4.0  --   --   --    < > = values in this interval not displayed.   Liver Function Tests Recent Labs    04/04/21 0502  AST 21  ALT 26  ALKPHOS 96  BILITOT 0.5  PROT 7.2  ALBUMIN 2.7*    No results for input(s): LIPASE, AMYLASE in the last 72 hours. Cardiac Enzymes No results for input(s): CKTOTAL, CKMB, CKMBINDEX, TROPONINI in the last 72 hours.  BNP: BNP (last 3 results) Recent Labs    11/30/20 0144 01/08/21 0731 01/18/21 1629  BNP 2,653.1* 734.2* 607.9*    ProBNP (last 3 results) No results for input(s): PROBNP in the last 8760 hours.    D-Dimer No results for input(s): DDIMER in the last 72 hours. Hemoglobin A1C No results for input(s): HGBA1C in the last 72 hours. Fasting Lipid Panel No results for input(s): CHOL, HDL, LDLCALC, TRIG, CHOLHDL, LDLDIRECT in the last 72 hours. Thyroid  Function Tests No results for input(s): TSH, T4TOTAL, T3FREE, THYROIDAB in the last 72 hours.  Invalid input(s): FREET3  Other results:   Imaging    DG Abd 1 View  Result Date: 04/04/2021 CLINICAL DATA:  65 year old male with history of abdominal pain. EXAM: ABDOMEN - 1 VIEW COMPARISON:  Abdominal radiograph 11/30/2020. FINDINGS: There is a paucity of bowel gas in the epigastric region and central abdomen, which could indicate a space occupying lesion or fluid-filled distended stomach. No pathologic dilatation of small bowel or colon. Gas and stool are noted throughout the colon and distal rectum. Rod and screw fixation hardware in the lower lumbar spine. IMPRESSION: 1. Unusual but nonobstructive bowel gas pattern. The possibility of a distended fluid-filled stomach or space-occupying lesion in the upper abdomen warrants consideration. Further evaluation with CT the abdomen and pelvis with IV contrast should be considered if clinically appropriate. Electronically Signed   By: Vinnie Langton M.D.   On: 04/04/2021 05:02     Medications:     Scheduled Medications:  (feeding supplement) PROSource Plus  30 mL Oral QID   atorvastatin  80 mg Oral QPM   Chlorhexidine Gluconate Cloth  6 each Topical Daily   clonazepam  0.25 mg Oral BID   digoxin  0.125 mg Oral Daily   diphenhydrAMINE  50 mg Oral QHS   ezetimibe  10 mg Oral Daily   feeding supplement  237 mL Oral TID BM   folic acid  1 mg Oral Daily   furosemide  80 mg Intravenous BID   gabapentin  200 mg Oral TID   lidocaine  1 patch Transdermal Q24H   mouth rinse  15 mL Mouth Rinse BID   melatonin  5 mg Oral QHS   midodrine  10 mg Oral TID WC   multivitamin with minerals  1 tablet Oral Daily   pantoprazole  40 mg Oral Daily   polyethylene glycol  17 g Oral Daily   polyvinyl alcohol  1 drop Both Eyes BID   potassium chloride  60 mEq Oral TID   senna  2 tablet Oral BID   sertraline  100 mg Oral Daily   sodium chloride flush  10-40  mL Intracatheter Q12H   sodium chloride flush  3 mL Intravenous Q12H   sodium chloride flush  3 mL Intravenous Q12H   tamsulosin  0.4 mg Oral Daily   vitamin B-12  100 mcg Oral Daily    Infusions:  sodium chloride     amiodarone 30 mg/hr (04/04/21 0017)  heparin 1,800 Units/hr (04/03/21 2156)   milrinone 0.375 mcg/kg/min (04/03/21 1614)     PRN Medications: sodium chloride, acetaminophen **OR** acetaminophen, antiseptic oral rinse, clonazepam, docusate sodium, guaiFENesin, ondansetron **OR** ondansetron (ZOFRAN) IV, sodium chloride flush, sodium chloride flush    Patient Profile   Mr Didonato is a 65 year old with history of smoker, CAD s/p previous MI, HTN, HL, chronic HFeEF, sepsis 11/2020 bacteremia.  Admitted with sepsis. Bld Cx - NGTD    Assessment/Plan    1. Acute on chronic systolic HFr EF - Echo 08/12/91 EF 20-25% RV mildly HK. moderate AS  Mean gradient 13 AVA 1.2 cm2 DI 0.30 - RHC (7/22): EF 20%, low filling pressures. CI 2.3.  - RHC 11/16  RA 2, PA 65/27 (44), PCW 28, SVR 960, CO 6.5,  CI 3.2 , PA 57%. Milrinone was increased to 0.375 mcg.  - Co-ox 65% on milrinone 0.375 mcg. - CVP 4-5. Volume stable. Will give IV lasix 80 mg just once this am. - No bb with low output HF - No ivabradine given AF   - Continue digoxin.  Dig level 11/22 0.5 - Continue midodrine 10 mg TID   - Discussed with [name] -  not a candidate for transplant.  - VAD with AVR planned for Friday  - Continue inotropic support, ambulation and aggressive nutrition interventions.   2. Sepsis - H/o strep bacteremia 11/2020.  ICD extracted 12/04/20. Barostim was not removed as it is extravascular.  - Received IV antibiotics until 01/15/21. Blood cultures negative 01/30/21..  - Procalcitonin 0.22->0.11 - 10/26 -Blood CX x2  Negative  - Ct chest concerning for pneumonia. Treated w/ cefepime/vancomycin -> Augmentin (stop 11/7) - Resolved  3. Acute Hypoxic Respiratory Failure  - CXR with pulmonary edema  initially, and was diuresed with IV Lasix.  -  CT chest completed 10/30 concerning for bronchopneumonia. On augmentin.  - Improved/resolved.  - On 2 liters [place], O2 sats stable.    4. CAD  s/p anterior STEMI (12/20). LHC showed chronically occluded RCA (with L>>R collaterals) and thrombotic occlusion of proximal LAD. Underwent PCI of LAD. - LHC (7/22) with stable CAD.  - Denies any angina - Continue atorvastatin 80. No aspirin d/t anticoagulation.  5. Severe low-flow aortic stenosis - not candidate for TAVR due to presence of fibroelastoma - pending AVR at time of VAD   6. H/O RUE DVT - Off eliquis as noted above.  - On heparin drip. Hgb stable at 8.6 this am  7. Severe protein-calorie malnutrition - prealbumin 11>15> 13 despite adequate caloric intake on calorie count - Pre-albumin dropping.  - Dietitian following with supplements adjusted.  - repeat calorie count completed and was adequate  8. Iron-deficiency anemia - Hgb stable at 8.6 11/22.  - Recheck today. - Rec'd Feraheme 11/8 and 11/13   9. Paroxysmal Atrial Fibrillation w/ RVR - new, developed 11/10. Now back in NSR   - Maintaining NSR. Continue amio gtt 30/hr while on milrinone - Off eliquis 11/20. Switched to heparin drip in anticipation of VAD. Hgb stable.  - Mag 2.1, K 4.0  - Keep K > 4.0 and Mg >2.0   10. Hypokalemia - K 4.0  11. Hyponatremia - Sodium 129 today.  - restrict free water - Monitor  12. Abdominal pain -Improving after BM this am -Xray this am with nonobstructive bowel gas pattern. Possible distended fluid-filled stomach or space occupying lesion. Reviewed with Dr. [name]. Large volume of stool.  -? Constipation. Will give sorbitol today.  -  Repeat x-ray this afternoon. Depending on findings, may need CT abdomen pelvis -No leukocytosis yesterday. CBC today. LFTs okay.     Continue to ambulate and work on nutrition. VAD on Friday.   Length of Stay: Kirkwood, [place], PA-C   04/04/2021, 7:27 AM  Advanced Heart Failure Team Pager [phone] (M-F; 7a - 5p)  Please contact [facility] for night-coverage after hours (5p -7a ) and weekends on [email or web address]  Patient seen and examined with the above-signed Advanced Practice Provider and/or Housestaff. I personally reviewed laboratory data, imaging studies and relevant notes. I independently examined the patient and formulated the important aspects of the plan. I have edited the note to reflect any of my changes or salient points. I have personally discussed the plan with the patient and/or family.  Remains on milrinone and IV lasix. Had ab pain last night. Improved with BM x 3. KUB non-obstructive bowel gas pattern.  Co-ox and CVP ok.   General:  Sitting in chair No resp difficulty HEENT: normal Neck: supple. no JVD. Carotids 2+ bilat; no bruits. No lymphadenopathy or thryomegaly appreciated. Cor: PMI nondisplaced. Regular rate & rhythm. No rubs, gallops or murmurs. Lungs: clear Abdomen: soft, nontender, nondistended. No hepatosplenomegaly. No bruits or masses. Good bowel sounds. Extremities: no cyanosis, clubbing, rash, edema Neuro: alert & orientedx3, cranial nerves grossly intact. moves all 4 extremities w/o difficulty. Affect pleasant  Ab is benign on exam today. Suspect constipation. Now improving. Will give sorbitol and repeat KUB. Continue. Milrinone. Decrease IV lasix to once a day. Continue po amio and IV heparin. VAD Friday.   Glori Bickers, MD  10:00 AM

## 2021-04-04 NOTE — Plan of Care (Signed)
  Problem: Education: Goal: Knowledge of General Education information will improve Description: Including pain rating scale, medication(s)/side effects and non-pharmacologic comfort measures Outcome: Progressing   Problem: Health Behavior/Discharge Planning: Goal: Ability to manage health-related needs will improve Outcome: Progressing   Problem: Clinical Measurements: Goal: Ability to maintain clinical measurements within normal limits will improve Outcome: Progressing Goal: Will remain free from infection Outcome: Progressing Goal: Diagnostic test results will improve Outcome: Progressing Goal: Cardiovascular complication will be avoided Outcome: Progressing   Problem: Elimination: Goal: Will not experience complications related to bowel motility Outcome: Progressing   Problem: Pain Managment: Goal: General experience of comfort will improve Outcome: Progressing

## 2021-04-04 NOTE — Progress Notes (Addendum)
ANTICOAGULATION CONSULT NOTE  Pharmacy Consult for heparin Indication: atrial fibrillation  No Active Allergies  Patient Measurements: Height: 6' (182.9 cm) Weight: 78.7 kg (173 lb 8 oz) IBW/kg (Calculated) : 77.6 Heparin Dosing Weight: 77kg  Vital Signs: Temp: 98.3 F (36.8 C) (11/23 0321) Temp Source: Oral (11/23 0321) BP: 104/72 (11/23 0321) Pulse Rate: 101 (11/23 0500)  Labs: Recent Labs    04/02/21 0430 04/02/21 0700 04/02/21 1843 04/03/21 0422 04/03/21 1400 04/04/21 0419  HGB 8.1*  --   --  8.6*  --   --   HCT 27.3*  --   --  28.2*  --   --   PLT 320  --   --  290  --   --   APTT  --  41*   < > 68* 58* 47*  HEPARINUNFRC  --  >1.10*  --  0.71*  --  0.34  CREATININE 0.75  --   --  0.77  --  0.79   < > = values in this interval not displayed.     Estimated Creatinine Clearance: 98.3 mL/min (by C-G formula based on SCr of 0.79 mg/dL).   Medical History: Past Medical History:  Diagnosis Date   AICD (automatic cardioverter/defibrillator) present 08/31/2019   Ankylosing spondylitis ([place])    Arthritis    BENIGN PROSTATIC HYPERTROPHY 06/07/2008   CHF (congestive heart failure) ([place])    COLONIC POLYPS, HX OF 06/07/2008   Depression    H/O hiatal hernia    Heart failure ([place])    HYPERLIPIDEMIA 06/07/2008   HYPERTENSION 06/07/2008   Myocardial infarction ([place]) 2005   NSTEMI, s/p LAD stent   NEPHROLITHIASIS, HX OF 06/07/2008   STEMI (ST elevation myocardial infarction) ([place]) 04/27/2019     Assessment: 16 yoM with end stage HFrEF with planned LVAD implant on 11/25. Pt is on apixaban for hx DVT and AFib, now to transition to IV heparin in anticipation of surgery.  Last dose of apixaban was 11/20 at 10am - will utilize aPTTs until apixaban affect wears off.  aPTT of 47 sec still subtherapeutic after rate increase to 1800 units/hr. CBC stable, level drawn appropriately. No infusion problems per nurse.  11/23 PM Update: aPTT therapeutic (77 sec) after rate  increase to 1900 units/hr.  Goal of Therapy:  Heparin level 0.3-0.7 units/ml aPTT 66-102 seconds Monitor platelets by anticoagulation protocol: Yes   Plan:  Continue heparin infusion 1900 units/hr Daily CBC, aPTT, heparin level until correlating  Laurey Arrow, PharmD PGY1 Pharmacy Resident 04/04/2021  7:32 AM  Please check [email or web address] for unit-specific pharmacy phone numbers.

## 2021-04-04 NOTE — Progress Notes (Signed)
Patient had large bowel movement after given sorbitol this am.   Notes improvement in lower chest/epigastric pain.   Repeat x-ray this afternoon with nonspecific gas pattern.

## 2021-04-04 NOTE — Progress Notes (Signed)
Nutrition Follow-up  DOCUMENTATION CODES:   Severe malnutrition in context of chronic illness  INTERVENTION:   -Continue Ensure Enlive po TID, each supplement provides 350 kcal and 20 grams of protein  -Continue 30 ml Prosource Plus QID, each supplement provides 100 kcals and 15 grams protein -Continue MVI with minerals daily -Continue with liberalized diet of regular  NUTRITION DIAGNOSIS:   Severe Malnutrition related to chronic illness (CHF) as evidenced by severe muscle depletion, severe fat depletion.  Ongoing  GOAL:   Patient will meet greater than or equal to 90% of their needs  Progressing   MONITOR:   PO intake, Supplement acceptance, Weight trends, Labs, I & O's  REASON FOR ASSESSMENT:   Consult Enteral/tube feeding initiation and management, Assessment of nutrition requirement/status  ASSESSMENT:   65 y.o. male presented to the ED with sudden onset of dyspnea. PMH includes CAD, CHF with ICD, HTN, and on 2L [place] at baseline. Pt admitted with respiratory failure with hypoxia with SIRS.  11/16 - s/p right heart cath  Reviewed I/O's: -2.7 L x 24 hours and -20.3 L since 03/21/21  UOP: 3.3 L x 24 hours   Pt unavailable at time of visit. Attempted to speak with pt via call to hospital room phone, however, unable to reach.   Pt continues with excellent oral intake. Noted meal completions 90-100%. Pt also consistently taking supplements (both Ensure Enlive and Prosource Plus).   Plan to continues to proceed with LVAD on 04/06/21.  Reviewed wt hx; wt has been stable over the past month.   Medications reviewed and include folic acid, miralax, vitamin B-12, and senokot.   Labs reviewed: Na: 129.    Diet Order:   Diet Order             Diet regular Room service appropriate? Yes; Fluid consistency: Thin  Diet effective now                   EDUCATION NEEDS:   No education needs have been identified at this time  Skin:  Skin Assessment: Reviewed RN  Assessment  Last BM:  04/01/21  Height:   Ht Readings from Last 1 Encounters:  03/07/21 6' (1.829 m)    Weight:   Wt Readings from Last 1 Encounters:  04/04/21 78.7 kg    Ideal Body Weight:  80.9 kg  BMI:  Body mass index is 23.53 kg/m.  Estimated Nutritional Needs:   Kcal:  2300-2500  Protein:  115-130 grams  Fluid:  >/= 2 L    Loistine Chance, RD, LDN, [place] Registered Dietitian II Certified Diabetes Care and Education Specialist Please refer to [facility] for RD and/or RD on-call/weekend/after hours pager

## 2021-04-05 DIAGNOSIS — R109 Unspecified abdominal pain: Secondary | ICD-10-CM | POA: Diagnosis not present

## 2021-04-05 DIAGNOSIS — I255 Ischemic cardiomyopathy: Secondary | ICD-10-CM | POA: Diagnosis not present

## 2021-04-05 DIAGNOSIS — I10 Essential (primary) hypertension: Secondary | ICD-10-CM | POA: Diagnosis not present

## 2021-04-05 DIAGNOSIS — I5022 Chronic systolic (congestive) heart failure: Secondary | ICD-10-CM

## 2021-04-05 DIAGNOSIS — I351 Nonrheumatic aortic (valve) insufficiency: Secondary | ICD-10-CM

## 2021-04-05 LAB — BASIC METABOLIC PANEL
Anion gap: 6 (ref 5–15)
BUN: 26 mg/dL — ABNORMAL HIGH (ref 8–23)
CO2: 25 mmol/L (ref 22–32)
Calcium: 8.6 mg/dL — ABNORMAL LOW (ref 8.9–10.3)
Chloride: 97 mmol/L — ABNORMAL LOW (ref 98–111)
Creatinine, Ser: 0.79 mg/dL (ref 0.61–1.24)
GFR, Estimated: >60 mL/min (ref >60)
Glucose, Bld: 141 mg/dL — ABNORMAL HIGH (ref 70–99)
Potassium: 4.5 mmol/L (ref 3.5–5.1)
Sodium: 128 mmol/L — ABNORMAL LOW (ref 135–145)

## 2021-04-05 LAB — CBC
HCT: 30.3 % — ABNORMAL LOW (ref 39.0–52.0)
Hemoglobin: 9.4 g/dL — ABNORMAL LOW (ref 13.0–17.0)
MCH: 24.9 pg — ABNORMAL LOW (ref 26.0–34.0)
MCHC: 31 g/dL (ref 30.0–36.0)
MCV: 80.2 fL (ref 80.0–100.0)
Platelets: 335 10*3/uL (ref 150–400)
RBC: 3.78 MIL/uL — ABNORMAL LOW (ref 4.22–5.81)
RDW: 20.3 % — ABNORMAL HIGH (ref 11.5–15.5)
WBC: 17.6 10*3/uL — ABNORMAL HIGH (ref 4.0–10.5)
nRBC: 0 % (ref 0.0–0.2)

## 2021-04-05 LAB — COOXEMETRY PANEL
Carboxyhemoglobin: 1.7 % — ABNORMAL HIGH (ref 0.5–1.5)
Carboxyhemoglobin: 2 % — ABNORMAL HIGH (ref 0.5–1.5)
Methemoglobin: 0.7 % (ref 0.0–1.5)
Methemoglobin: 0.7 % (ref 0.0–1.5)
O2 Saturation: 56 %
O2 Saturation: 52.8 %
Total hemoglobin: 10 g/dL — ABNORMAL LOW (ref 12.0–16.0)
Total hemoglobin: 9.5 g/dL — ABNORMAL LOW (ref 12.0–16.0)

## 2021-04-05 LAB — HEPARIN LEVEL (UNFRACTIONATED): Heparin Unfractionated: 0.41 IU/mL (ref 0.30–0.70)

## 2021-04-05 LAB — MAGNESIUM: Magnesium: 2 mg/dL (ref 1.7–2.4)

## 2021-04-05 LAB — APTT: aPTT: 61 seconds — ABNORMAL HIGH (ref 24–36)

## 2021-04-05 MED ORDER — DIAZEPAM 5 MG PO TABS
5.0000 mg | ORAL_TABLET | Freq: Once | ORAL | Status: AC
  Administered 2021-04-06: 5 mg via ORAL
  Filled 2021-04-05: qty 1

## 2021-04-05 MED ORDER — TEMAZEPAM 15 MG PO CAPS
15.0000 mg | ORAL_CAPSULE | Freq: Once | ORAL | Status: AC | PRN
  Administered 2021-04-05: 15 mg via ORAL
  Filled 2021-04-05: qty 1

## 2021-04-05 MED ORDER — MUPIROCIN 2 % EX OINT
1.0000 "application " | TOPICAL_OINTMENT | Freq: Two times a day (BID) | CUTANEOUS | Status: DC
  Administered 2021-04-05: 1 via NASAL
  Filled 2021-04-05: qty 22

## 2021-04-05 MED ORDER — SODIUM CHLORIDE 0.9 % IV SOLN
600.0000 mg | INTRAVENOUS | Status: DC
  Filled 2021-04-05: qty 600

## 2021-04-05 MED ORDER — BISACODYL 5 MG PO TBEC
5.0000 mg | DELAYED_RELEASE_TABLET | Freq: Once | ORAL | Status: AC
  Administered 2021-04-05: 5 mg via ORAL
  Filled 2021-04-05: qty 1

## 2021-04-05 MED ORDER — CHLORHEXIDINE GLUCONATE 0.12 % MT SOLN
15.0000 mL | Freq: Once | OROMUCOSAL | Status: AC
  Administered 2021-04-06: 15 mL via OROMUCOSAL
  Filled 2021-04-05: qty 15

## 2021-04-05 MED ORDER — METOLAZONE 2.5 MG PO TABS
2.5000 mg | ORAL_TABLET | Freq: Once | ORAL | Status: AC
  Administered 2021-04-05: 2.5 mg via ORAL
  Filled 2021-04-05: qty 1

## 2021-04-05 MED ORDER — CHLORHEXIDINE GLUCONATE CLOTH 2 % EX PADS
6.0000 | MEDICATED_PAD | Freq: Once | CUTANEOUS | Status: AC
  Administered 2021-04-06: 6 via TOPICAL

## 2021-04-05 MED ORDER — CHLORHEXIDINE GLUCONATE CLOTH 2 % EX PADS
6.0000 | MEDICATED_PAD | Freq: Once | CUTANEOUS | Status: AC
  Administered 2021-04-05: 6 via TOPICAL

## 2021-04-05 MED ORDER — FUROSEMIDE 10 MG/ML IJ SOLN
80.0000 mg | Freq: Two times a day (BID) | INTRAMUSCULAR | Status: DC
  Administered 2021-04-05 (×2): 80 mg via INTRAVENOUS
  Filled 2021-04-05 (×2): qty 8

## 2021-04-05 NOTE — Progress Notes (Signed)
Patient ID: Brandon [name], male   DOB: 12-Sep-1955, 65 y.o.   MRN: [id number]     Advanced Heart Failure Rounding Note  PCP-Cardiologist: [name], MD   Subjective:    Admitted with sepsis. Started on cefepime + vanc, now off abx (ID following). Afebrile.  10/27 started on ivabradine 7.5 mg twice a day and midodrine 5 mg TID 10/29 Co-ox 48%, started on milrinone 0.25.  10/30 IV Antibiotics stopped. Blood cultures no growth for 5 days.  10/31 started on Augmentin for PNA.  11/1 Milrinone off 11/2 Milrinone added back. Midodrine increased to 10 mg TID 11/8 Placed on 80 mg po lasix  11/10 Developed Afib w/ RVR, converted with IV amio 11/13 Give 250 cc NS bolus for low CVP.  11/14 Milrinone increased to 0.25 mcg. Diuresed with IV lasix  11/16 Had RHC with elevated left sided filling pressures PCWP 28, PA sat 57%, and normal cardiac output. Milrinone was increased to 0.375 mcg. Continued to diurese with IV lasix.   Co-ox 56% on 0.375 milrinone CVP 4  Continues on IV lasix though it was decreased to daily yesterday. Weight climbing. Says he had a rough night. + orthopnea and SOB. Apparently milrinone was off for some time last night.   Ab pain improved with BMs  Scr stable at 0.79.    Objective:   Weight Range: 79.6 kg Body mass index is 23.8 kg/m.   Vital Signs:   Temp:  [97.5 F (36.4 C)-98.4 F (36.9 C)] 98.2 F (36.8 C) (11/24 0737) Pulse Rate:  [100-114] 114 (11/24 0737) Resp:  [18-35] 35 (11/24 0737) BP: (97-145)/(64-126) 145/126 (11/24 0737) SpO2:  [92 %-100 %] 95 % (11/24 0737) Weight:  [79.6 kg] 79.6 kg (11/24 0419) Last BM Date: 04/04/21  Weight change: Filed Weights   04/03/21 0447 04/04/21 0449 04/05/21 0419  Weight: 77.9 kg 78.7 kg 79.6 kg    Intake/Output:   Intake/Output Summary (Last 24 hours) at 04/05/2021 1032 Last data filed at 04/05/2021 0800 Gross per 24 hour  Intake 1036 ml  Output 1775 ml  Net -739 ml      Physical Exam    General:  Weak appearing. No resp difficulty HEENT: normal Neck: supple. no JVD. Carotids 2+ bilat; no bruits. No lymphadenopathy or thryomegaly appreciated. Cor: PMI nondisplaced. Regular tachy. 2/6 AS Lungs: clear Abdomen: soft, nontender, nondistended. No hepatosplenomegaly. No bruits or masses. Good bowel sounds. Extremities: no cyanosis, clubbing, rash, edema Neuro: alert & orientedx3, cranial nerves grossly intact. moves all 4 extremities w/o difficulty. Affect pleasant   Telemetry   Sinus 100-115 Personally reviewed   Labs    CBC Recent Labs    04/03/21 0422 04/04/21 0859  WBC 9.8 12.0*  HGB 8.6* 8.9*  HCT 28.2* 29.7*  MCV 81.5 82.0  PLT 290 409    Basic Metabolic Panel Recent Labs    04/04/21 0419 04/05/21 0500  NA 129* 128*  K 4.0 4.5  CL 94* 97*  CO2 27 25  GLUCOSE 275* 141*  BUN 28* 26*  CREATININE 0.79 0.79  CALCIUM 8.8* 8.6*  MG 2.1 2.0    Liver Function Tests Recent Labs    04/04/21 0502  AST 21  ALT 26  ALKPHOS 96  BILITOT 0.5  PROT 7.2  ALBUMIN 2.7*     No results for input(s): LIPASE, AMYLASE in the last 72 hours. Cardiac Enzymes No results for input(s): CKTOTAL, CKMB, CKMBINDEX, TROPONINI in the last 72 hours.  BNP: BNP (last 3 results)  Recent Labs    11/30/20 0144 01/08/21 0731 01/18/21 1629  BNP 2,653.1* 734.2* 607.9*     ProBNP (last 3 results) No results for input(s): PROBNP in the last 8760 hours.    D-Dimer No results for input(s): DDIMER in the last 72 hours. Hemoglobin A1C No results for input(s): HGBA1C in the last 72 hours. Fasting Lipid Panel No results for input(s): CHOL, HDL, LDLCALC, TRIG, CHOLHDL, LDLDIRECT in the last 72 hours. Thyroid Function Tests No results for input(s): TSH, T4TOTAL, T3FREE, THYROIDAB in the last 72 hours.  Invalid input(s): FREET3  Other results:   Imaging    DG Abd 1 View  Result Date: 04/04/2021 CLINICAL DATA:  Abdominal pain EXAM: ABDOMEN - 1 VIEW  COMPARISON:  Study done earlier today FINDINGS: Bowel gas pattern is nonspecific. Moderate amount of gas is seen in the colon. There is no evidence of fecal impaction in the rectosigmoid. No abnormal calcifications are seen. Surgical fusion is seen at L5-S1 level. Diffuse calcifications in the spinal ligaments and possible fusion of SI joints suggest ankylosing spondylitis. IMPRESSION: Nonspecific bowel gas pattern. Electronically Signed   By: Elmer Picker M.D.   On: 04/04/2021 16:15     Medications:     Scheduled Medications:  (feeding supplement) PROSource Plus  30 mL Oral QID   atorvastatin  80 mg Oral QPM   Chlorhexidine Gluconate Cloth  6 each Topical Daily   clonazepam  0.25 mg Oral BID   digoxin  0.125 mg Oral Daily   diphenhydrAMINE  50 mg Oral QHS   [START ON 04/06/2021] epinephrine  0-10 mcg/min Intravenous To OR   ezetimibe  10 mg Oral Daily   feeding supplement  237 mL Oral TID BM   folic acid  1 mg Oral Daily   gabapentin  200 mg Oral TID   [START ON 04/06/2021] insulin   Intravenous To OR   [START ON 04/06/2021] [place] Blood Cardioplegia vial (lidocaine/magnesium/mannitol 0.26g-4g-6.4g)   Intracoronary To OR   lidocaine  1 patch Transdermal Q24H   mouth rinse  15 mL Mouth Rinse BID   melatonin  5 mg Oral QHS   midodrine  10 mg Oral TID WC   multivitamin with minerals  1 tablet Oral Daily   pantoprazole  40 mg Oral Daily   [START ON 04/06/2021] phenylephrine  0-100 mcg/min Intravenous To OR   polyethylene glycol  17 g Oral Daily   polyvinyl alcohol  1 drop Both Eyes BID   [START ON 04/06/2021] potassium chloride  80 mEq Other To OR   senna  2 tablet Oral BID   sertraline  100 mg Oral Daily   sodium chloride flush  10-40 mL Intracatheter Q12H   sodium chloride flush  3 mL Intravenous Q12H   sodium chloride flush  3 mL Intravenous Q12H   tamsulosin  0.4 mg Oral Daily   [START ON 04/06/2021] tranexamic acid  15 mg/kg Intravenous To OR   [START ON 04/06/2021]  tranexamic acid  2 mg/kg Intracatheter To OR   [START ON 04/06/2021] vancomycin  1,000 mg Other To OR   [START ON 04/06/2021] vasopressin  0.04 Units/min Intravenous To OR   vitamin B-12  100 mcg Oral Daily    Infusions:  sodium chloride     amiodarone 30 mg/hr (04/05/21 0152)   [START ON 04/06/2021]  ceFAZolin (ANCEF) IV     [START ON 04/06/2021]  ceFAZolin (ANCEF) IV     [START ON 04/06/2021] dexmedetomidine     [START  ON 04/06/2021] DOBUTamine     [START ON 04/06/2021] DOPamine     [START ON 04/06/2021] fluconazole (DIFLUCAN) IV     [START ON 04/06/2021] heparin 30,000 units/NS 1000 mL solution for CELLSAVER     heparin 2,000 Units/hr (04/05/21 0940)   milrinone 0.375 mcg/kg/min (04/05/21 0939)   [START ON 04/06/2021] milrinone     [START ON 04/06/2021] nitroGLYCERIN     [START ON 04/06/2021] norepinephrine (LEVOPHED) Adult infusion     [START ON 04/06/2021] rifampin (RIFADIN) IVPB     [START ON 04/06/2021] tranexamic acid (CYKLOKAPRON) infusion ([facility])     [START ON 04/06/2021] vancomycin       PRN Medications: sodium chloride, acetaminophen **OR** acetaminophen, antiseptic oral rinse, clonazepam, docusate sodium, guaiFENesin, ondansetron **OR** ondansetron (ZOFRAN) IV, sodium chloride flush, sodium chloride flush    Patient Profile   Mr Laredo is a 65 year old with history of smoker, CAD s/p previous MI, HTN, HL, chronic HFeEF, sepsis 11/2020 bacteremia.  Admitted with sepsis. Bld Cx - NGTD    Assessment/Plan    1. Acute on chronic systolic HFr EF - Echo 01/19/82 EF 20-25% RV mildly HK. moderate AS  Mean gradient 13 AVA 1.2 cm2 DI 0.30 - RHC (7/22): EF 20%, low filling pressures. CI 2.3.  - RHC 11/16  RA 2, PA 65/27 (44), PCW 28, SVR 960, CO 6.5,  CI 3.2 , PA 57%. Milrinone was increased to 0.375 mcg.  - Persistent NYHA IV. Admitted with shock - Co-ox 56% on milrinone 0.375 mcg. - Volume up again. Increase lasix to 80 IV bid. Give metolazone 2.5 today - No bb with low  output HF - No ivabradine given AF   - Continue digoxin.  Dig level 11/22 0.5 - Continue midodrine 10 mg TID   - Discussed with [name] -  not a candidate for transplant.  - VAD with AVR planned for Friday  - Continue inotropic support, ambulation and aggressive nutrition interventions.  - Plan VAD in am. D/w Dr. Cyndia Bent. OR orders in place.   2. Sepsis - H/o strep bacteremia 11/2020.  ICD extracted 12/04/20. Barostim was not removed as it is extravascular.  - Received IV antibiotics until 01/15/21. Blood cultures negative 01/30/21..  - Procalcitonin 0.22->0.11 - 10/26 -Blood CX x2  Negative  - Ct chest concerning for pneumonia. Treated w/ cefepime/vancomycin -> Augmentin (stop 11/7) - Resolved  3. Acute Hypoxic Respiratory Failure  - CXR with pulmonary edema initially, and was diuresed with IV Lasix.  -  CT chest completed 10/30 concerning for bronchopneumonia. On augmentin.  - Improved/resolved.  - On 2 liters [place], O2 sats stable   4. CAD  s/p anterior STEMI (12/20). LHC showed chronically occluded RCA (with L>>R collaterals) and thrombotic occlusion of proximal LAD. Underwent PCI of LAD. - LHC (7/22) with stable CAD.  - No s/s angina  - Continue atorvastatin 80. No aspirin d/t anticoagulation.  5. Severe low-flow aortic stenosis - not candidate for TAVR due to presence of fibroelastoma - pending AVR at time of VAD   6. H/O RUE DVT - Off eliquis as noted above.  - On heparin drip. Hgb stable at 8.9 this am  7. Severe protein-calorie malnutrition - prealbumin 11>15> 13 despite adequate caloric intake on calorie count - Pre-albumin dropping.  - Dietitian following with supplements adjusted.  - repeat calorie count completed and was adequate  8. Iron-deficiency anemia - Hgb stable at 8.9 - Rec'd Feraheme 11/8 and 11/13   9. Paroxysmal Atrial Fibrillation w/  RVR - new, developed 11/10. Now back in NSR   - Maintaining NSR. Continue amio gtt 30/hr while on milrinone - Off eliquis  11/20. Switched to heparin drip in anticipation of VAD. Hgb stable at 8.9 - Keep K > 4.0 and Mg >2.0   10. Hypokalemia - K 4.5  11. Hyponatremia - Sodium 128 today.  - restrict free water - Monitor  12. Abdominal pain - resolved with BM   Length of Stay: [place], MD  04/05/2021, 10:32 AM  Advanced Heart Failure Team Pager [phone] (M-F; 7a - 5p)  Please contact [facility] for night-coverage after hours (5p -7a ) and weekends on [email or web address]

## 2021-04-05 NOTE — Plan of Care (Signed)
  Problem: Education: Goal: Knowledge of General Education information will improve Description: Including pain rating scale, medication(s)/side effects and non-pharmacologic comfort measures 04/05/2021 0750 by Trellis Moment, RN Outcome: Progressing 04/05/2021 0750 by Trellis Moment, RN Outcome: Progressing   Problem: Health Behavior/Discharge Planning: Goal: Ability to manage health-related needs will improve 04/05/2021 0750 by Trellis Moment, RN Outcome: Progressing 04/05/2021 0750 by Trellis Moment, RN Outcome: Progressing   Problem: Clinical Measurements: Goal: Ability to maintain clinical measurements within normal limits will improve 04/05/2021 0750 by Trellis Moment, RN Outcome: Progressing 04/05/2021 0750 by Trellis Moment, RN Outcome: Progressing Goal: Will remain free from infection 04/05/2021 0750 by Trellis Moment, RN Outcome: Progressing 04/05/2021 0750 by Trellis Moment, RN Outcome: Progressing Goal: Diagnostic test results will improve 04/05/2021 0750 by Trellis Moment, RN Outcome: Progressing 04/05/2021 0750 by Trellis Moment, RN Outcome: Progressing Goal: Respiratory complications will improve 04/05/2021 0750 by Trellis Moment, RN Outcome: Progressing 04/05/2021 0750 by Trellis Moment, RN Outcome: Progressing Goal: Cardiovascular complication will be avoided 04/05/2021 0750 by Trellis Moment, RN Outcome: Progressing 04/05/2021 0750 by Trellis Moment, RN Outcome: Progressing   Problem: Nutrition: Goal: Adequate nutrition will be maintained 04/05/2021 0750 by Trellis Moment, RN Outcome: Progressing 04/05/2021 0750 by Trellis Moment, RN Outcome: Progressing   Problem: Elimination: Goal: Will not experience complications related to bowel motility 04/05/2021 0750 by Trellis Moment, RN Outcome: Progressing 04/05/2021 0750 by Trellis Moment, RN Outcome: Progressing Goal: Will not experience complications related to urinary retention 04/05/2021 0750 by Trellis Moment, RN Outcome:  Progressing 04/05/2021 0750 by Trellis Moment, RN Outcome: Progressing   Problem: Pain Managment: Goal: General experience of comfort will improve 04/05/2021 0750 by Trellis Moment, RN Outcome: Progressing 04/05/2021 0750 by Trellis Moment, RN Outcome: Progressing   Problem: Safety: Goal: Ability to remain free from injury will improve 04/05/2021 0750 by Trellis Moment, RN Outcome: Progressing 04/05/2021 0750 by Trellis Moment, RN Outcome: Progressing   Problem: Skin Integrity: Goal: Risk for impaired skin integrity will decrease 04/05/2021 0750 by Trellis Moment, RN Outcome: Progressing 04/05/2021 0750 by Trellis Moment, RN Outcome: Progressing   Problem: Education: Goal: Ability to demonstrate management of disease process will improve 04/05/2021 0750 by Trellis Moment, RN Outcome: Progressing 04/05/2021 0750 by Trellis Moment, RN Outcome: Progressing Goal: Ability to verbalize understanding of medication therapies will improve 04/05/2021 0750 by Trellis Moment, RN Outcome: Progressing 04/05/2021 0750 by Trellis Moment, RN Outcome: Progressing Goal: Individualized Educational Video(s) 04/05/2021 0750 by Trellis Moment, RN Outcome: Progressing 04/05/2021 0750 by Trellis Moment, RN Outcome: Progressing   Problem: Activity: Goal: Capacity to carry out activities will improve 04/05/2021 0750 by Trellis Moment, RN Outcome: Progressing 04/05/2021 0750 by Trellis Moment, RN Outcome: Progressing   Problem: Cardiac: Goal: Ability to achieve and maintain adequate cardiopulmonary perfusion will improve 04/05/2021 0750 by Trellis Moment, RN Outcome: Progressing 04/05/2021 0750 by Trellis Moment, RN Outcome: Progressing   Problem: Activity: Goal: Risk for activity intolerance will decrease 04/05/2021 0750 by Trellis Moment, RN Outcome: Not Progressing 04/05/2021 0750 by Trellis Moment, RN Outcome: Not Progressing   Problem: Coping: Goal: Level of anxiety will decrease 04/05/2021 0750 by Trellis Moment,  RN Outcome: Not Progressing 04/05/2021 0750 by Trellis Moment, RN Outcome: Not Progressing

## 2021-04-05 NOTE — Progress Notes (Signed)
[facility]  PROGRESS NOTE  Brandon [name] AJO:[id number] DOB: Jan 07, 1956 DOA: 03/07/2021 PCP: [name], MD   Brief HPI:   65 year old male with past medical history of coronary artery disease, chronic systolic heart failure with AICD extracted on 12/01/2020 due to infection, completed antibiotics on 01/15/2021 with Rocephin, moderate aortic stenosis, right upper extremity DVT came to ED with acute onset of dyspnea.  Was found to be hypoxemic, placed on 2 L/min of oxygen found to be in acute decompensated heart failure.  Cardiology was consulted.  Significant Events: 10/29 Co-ox 48%, started on milrinone 0.25.  10/30 IV Antibiotics stopped. Blood cultures no growth for 5 days.  10/31 started on augmentin. Milrinone cut back to 0.125 mcg.  03/12/2021 started on midodrine  03/24/2021 started back on milrinone   Antibiotics: 03/07/2021 IV Vanco and cefepime till 03/10/2021 03/11/2021 Augmentin  Microbiology data: 03/07/2021 blood culture: Remain negative till date   Procedures: None  Subjective   Patient says that he did not sleep well last night.  Complains of shortness of breath.  Has been sitting in chair.  Says that RN found that milrinone was off this morning.  It has been restarted per RN.   Assessment/Plan:   Pneumonia/sepsis -Initially started on vancomycin and cefepime which was stopped on 10/30 -High-resolution CT scan of the chest showed bronchopneumonia, so ID started him back on Augmentin for 7 more days -Completed Augmentin course on 11/7 -WBC is normal   Acute respiratory failure with hypoxemia due to acute on chronic systolic heart failure -Last 2D echo in July 2022 showed EF 20% -Currently on ivabradine,  digoxin, midodrine, milrinone -Continue furosemide 80 mg IV twice daily -Underwent right heart cardiac cath on 11/16 -Cardiology has been treating his CHF with IV Lasix -Not a transplant candidate  per [name], as per cards note -Plan for LVAD is  11/25  Dyslipidemia -Continue atorvastatin  Right upper extremity DVT with neuropathy -Continue gabapentin -Apixaban was transitioned to heparin on 11/20  Depression -Continue sertraline  BPH -Continue Flomax  Protein calorie malnutrition -Continue Ensure 3 times a day -dietitian consulted  Severe low-flow aortic stenosis -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  Mild hyponatremia -Secondary to CHF  Atrial fibrillation with RVR -Went into A. fib  -Started on amiodarone infusion -Converted back to normal sinus rhythm -Patient is on anticoagulation with heparin  Hypokalemia -Replete  Hyponatremia -Today sodium is 128 -Likely in the setting of CHF exacerbation -Also on diuretics -We will closely monitor patient's sodium level.  Anemia -Due to iron-deficiency -Received IV iron x2  Anxiety -Patient on scheduled Klonopin 0.25 mg p.o. twice daily  Constipation -Resolved after patient received sorbitol this morning -Repeat abdominal x-ray shows nonobstructive bowel gas pattern   Scheduled medications:    (feeding supplement) PROSource Plus  30 mL Oral QID   atorvastatin  80 mg Oral QPM   Chlorhexidine Gluconate Cloth  6 each Topical Daily   clonazepam  0.25 mg Oral BID   digoxin  0.125 mg Oral Daily   diphenhydrAMINE  50 mg Oral QHS   [START ON 04/06/2021] epinephrine  0-10 mcg/min Intravenous To OR   ezetimibe  10 mg Oral Daily   feeding supplement  237 mL Oral TID BM   folic acid  1 mg Oral Daily   furosemide  80 mg Intravenous BID   gabapentin  200 mg  Oral TID   [START ON 04/06/2021] insulin   Intravenous To OR   [START ON 04/06/2021] [place] Blood Cardioplegia vial (lidocaine/magnesium/mannitol 0.26g-4g-6.4g)   Intracoronary To OR   lidocaine  1 patch Transdermal Q24H   mouth rinse  15 mL Mouth Rinse BID   melatonin  5 mg  Oral QHS   midodrine  10 mg Oral TID WC   multivitamin with minerals  1 tablet Oral Daily   pantoprazole  40 mg Oral Daily   [START ON 04/06/2021] phenylephrine  0-100 mcg/min Intravenous To OR   polyethylene glycol  17 g Oral Daily   polyvinyl alcohol  1 drop Both Eyes BID   [START ON 04/06/2021] potassium chloride  80 mEq Other To OR   senna  2 tablet Oral BID   sertraline  100 mg Oral Daily   sodium chloride flush  10-40 mL Intracatheter Q12H   sodium chloride flush  3 mL Intravenous Q12H   sodium chloride flush  3 mL Intravenous Q12H   tamsulosin  0.4 mg Oral Daily   [START ON 04/06/2021] tranexamic acid  15 mg/kg Intravenous To OR   [START ON 04/06/2021] tranexamic acid  2 mg/kg Intracatheter To OR   [START ON 04/06/2021] vancomycin  1,000 mg Other To OR   [START ON 04/06/2021] vasopressin  0.04 Units/min Intravenous To OR   vitamin B-12  100 mcg Oral Daily     Data Reviewed:   CBG:  No results for input(s): GLUCAP in the last 168 hours.  SpO2: 92 % O2 Flow Rate (L/min): 5 L/min    Vitals:   04/05/21 0419 04/05/21 0731 04/05/21 0737 04/05/21 1126  BP:   (!) 145/126 121/78  Pulse:  (!) 104 (!) 114 (!) 116  Resp:  18 (!) 35 (!) 35  Temp:   98.2 F (36.8 C) 98.1 F (36.7 C)  TempSrc:   Oral Oral  SpO2:  95% 95% 92%  Weight: 79.6 kg     Height:         Intake/Output Summary (Last 24 hours) at 04/05/2021 1449 Last data filed at 04/05/2021 0800 Gross per 24 hour  Intake 756 ml  Output 2075 ml  Net -1319 ml    11/22 1901 - 11/24 0700 In: 8315 [P.O.:1420] Out: 1761 [Urine:3050]  Filed Weights   04/03/21 0447 04/04/21 0449 04/05/21 0419  Weight: 77.9 kg 78.7 kg 79.6 kg    Data Reviewed: Basic Metabolic Panel: Recent Labs  Lab 04/01/21 0544 04/01/21 0656 04/01/21 1402 04/01/21 2224 04/02/21 0430 04/03/21 0422 04/04/21 0419 04/05/21 0500  NA 133*  --  130* 131* 132* 132* 129* 128*  K 2.5*   < > 2.8* 3.8 3.6 3.8 4.0 4.5  CL 91*  --  89* 96* 97*  96* 94* 97*  CO2 33*  --  31 28 28 26 27 25   GLUCOSE 100*  --  241* 127* 104* 116* 275* 141*  BUN 23  --  21 25* 24* 30* 28* 26*  CREATININE 0.80  --  0.74 0.77 0.75 0.77 0.79 0.79  CALCIUM 8.7*  --  8.7* 8.8* 8.8* 9.3 8.8* 8.6*  MG 2.1  --   --   --  2.1 2.0 2.1 2.0  PHOS  --   --  4.0  --   --   --   --   --    < > = values in this interval not displayed.   Liver Function Tests: Recent Labs  Lab 04/04/21 0502  AST 21  ALT 26  ALKPHOS 96  BILITOT 0.5  PROT 7.2  ALBUMIN 2.7*   No results for input(s): LIPASE, AMYLASE in the last 168 hours. No results for input(s): AMMONIA in the last 168 hours. CBC: Recent Labs  Lab 04/01/21 0405 04/02/21 0430 04/03/21 0422 04/04/21 0859 04/05/21 1000  WBC 10.1 10.0 9.8 12.0* 17.6*  HGB 8.6* 8.1* 8.6* 8.9* 9.4*  HCT 28.4* 27.3* 28.2* 29.7* 30.3*  MCV 81.1 83.7 81.5 82.0 80.2  PLT 344 320 290 321 335   Cardiac Enzymes: No results for input(s): CKTOTAL, CKMB, CKMBINDEX, TROPONINI in the last 168 hours. BNP (last 3 results) Recent Labs    11/30/20 0144 01/08/21 0731 01/18/21 1629  BNP 2,653.1* 734.2* 607.9*    ProBNP (last 3 results) No results for input(s): PROBNP in the last 8760 hours.   CBG: No results for input(s): GLUCAP in the last 168 hours.     Radiology Reports  DG Abd 1 View  Result Date: 04/04/2021 CLINICAL DATA:  Abdominal pain EXAM: ABDOMEN - 1 VIEW COMPARISON:  Study done earlier today FINDINGS: Bowel gas pattern is nonspecific. Moderate amount of gas is seen in the colon. There is no evidence of fecal impaction in the rectosigmoid. No abnormal calcifications are seen. Surgical fusion is seen at L5-S1 level. Diffuse calcifications in the spinal ligaments and possible fusion of SI joints suggest ankylosing spondylitis. IMPRESSION: Nonspecific bowel gas pattern. Electronically Signed   By: Elmer Picker M.D.   On: 04/04/2021 16:15   DG Abd 1 View  Result Date: 04/04/2021 CLINICAL DATA:  65 year old  male with history of abdominal pain. EXAM: ABDOMEN - 1 VIEW COMPARISON:  Abdominal radiograph 11/30/2020. FINDINGS: There is a paucity of bowel gas in the epigastric region and central abdomen, which could indicate a space occupying lesion or fluid-filled distended stomach. No pathologic dilatation of small bowel or colon. Gas and stool are noted throughout the colon and distal rectum. Rod and screw fixation hardware in the lower lumbar spine. IMPRESSION: 1. Unusual but nonobstructive bowel gas pattern. The possibility of a distended fluid-filled stomach or space-occupying lesion in the upper abdomen warrants consideration. Further evaluation with CT the abdomen and pelvis with IV contrast should be considered if clinically appropriate. Electronically Signed   By: Vinnie Langton M.D.   On: 04/04/2021 05:02       Antibiotics: Anti-infectives (From admission, onward)    Start     Dose/Rate Route Frequency Ordered Stop   04/06/21 0400  vancomycin (VANCOREADY) IVPB 1250 mg/250 mL  Status:  Discontinued        1,250 mg 166.7 mL/hr over 90 Minutes Intravenous To Surgery 04/04/21 0904 04/04/21 1412   04/06/21 0400  ceFAZolin (ANCEF) IVPB 2g/100 mL premix  Status:  Discontinued        2 g 200 mL/hr over 30 Minutes Intravenous To Surgery 04/04/21 0904 04/04/21 1412   04/06/21 0400  ceFAZolin (ANCEF) IVPB 2g/100 mL premix  Status:  Discontinued        2 g 200 mL/hr over 30 Minutes Intravenous To Surgery 04/04/21 0904 04/04/21 1412   04/06/21 0400  vancomycin (VANCOREADY) IVPB 1250 mg/250 mL        1,250 mg 166.7 mL/hr over 90 Minutes Intravenous To Surgery 04/04/21 1418 04/07/21 0400   04/06/21 0400  ceFAZolin (ANCEF) IVPB 2g/100 mL premix        2 g 200 mL/hr over 30 Minutes Intravenous To Surgery 04/04/21 1418 04/07/21 0400   04/06/21 0400  ceFAZolin (ANCEF) IVPB 2g/100 mL premix        2 g 200 mL/hr over 30 Minutes Intravenous To Surgery 04/04/21 1418 04/07/21 0400   04/06/21 0400  fluconazole  (DIFLUCAN) IVPB 400 mg        400 mg 100 mL/hr over 120 Minutes Intravenous To Surgery 04/04/21 1418 04/07/21 0400   04/06/21 0400  rifampin (RIFADIN) 600 mg in sodium chloride 0.9 % 100 mL IVPB        600 mg 200 mL/hr over 30 Minutes Intravenous To Surgery 04/04/21 1418 04/07/21 0400   04/06/21 0400  vancomycin (VANCOCIN) powder 1,000 mg        1,000 mg Other To Surgery 04/04/21 1418 04/07/21 0400   03/12/21 1000  amoxicillin-clavulanate (AUGMENTIN) 875-125 MG per tablet 1 tablet        1 tablet Oral Every 12 hours 03/12/21 0832 03/19/21 0935   03/08/21 0700  vancomycin (VANCOREADY) IVPB 1250 mg/250 mL  Status:  Discontinued        1,250 mg 166.7 mL/hr over 90 Minutes Intravenous Every 12 hours 03/07/21 1801 03/09/21 1320   03/08/21 0000  ceFEPIme (MAXIPIME) 2 g in sodium chloride 0.9 % 100 mL IVPB  Status:  Discontinued        2 g 200 mL/hr over 30 Minutes Intravenous Every 8 hours 03/07/21 1800 03/10/21 1201   03/07/21 1500  vancomycin (VANCOREADY) IVPB 1500 mg/300 mL        1,500 mg 150 mL/hr over 120 Minutes Intravenous  Once 03/07/21 1455 03/08/21 0118   03/07/21 1500  ceFEPIme (MAXIPIME) 2 g in sodium chloride 0.9 % 100 mL IVPB        2 g 200 mL/hr over 30 Minutes Intravenous  Once 03/07/21 1455 03/07/21 1628         DVT prophylaxis: Apixaban  Code Status: Full code  Family Communication: No family at bedside   Consultants: Cardiology  Procedures:     Objective   General-appears in no acute distress Heart-S1-S2, regular, no murmur auscultated Lungs-bibasilar crackles auscultated Abdomen-soft, nontender, no organomegaly Extremities-no edema in the lower extremities Neuro-alert, oriented x3, no focal deficit noted  Status is: Inpatient  Dispo: The patient is from: Home              Anticipated d/c is to: Home              Anticipated d/c date is: 04/10/2021              Patient currently not stable for discharge  Barrier to discharge-ongoing treatment  of acute systolic heart failure  OYDXA-12 Labs  No results for input(s): DDIMER, FERRITIN, LDH, CRP in the last 72 hours.  Lab Results  Component Value Date   SARSCOV2NAA NEGATIVE 03/07/2021   [place] NEGATIVE 01/08/2021   [place] NEGATIVE 12/04/2020   [place] NEGATIVE 11/14/2020        [place]   [facility] If 7PM-7AM, please contact night-coverage at [email or web address], Office  [phone]   04/05/2021, 2:49 PM  LOS: 28 days

## 2021-04-05 NOTE — Plan of Care (Signed)
  Problem: Education: Goal: Knowledge of General Education information will improve Description: Including pain rating scale, medication(s)/side effects and non-pharmacologic comfort measures Outcome: Progressing   Problem: Health Behavior/Discharge Planning: Goal: Ability to manage health-related needs will improve Outcome: Progressing   Problem: Clinical Measurements: Goal: Ability to maintain clinical measurements within normal limits will improve Outcome: Progressing Goal: Respiratory complications will improve Outcome: Progressing   Problem: Activity: Goal: Risk for activity intolerance will decrease Outcome: Progressing   Problem: Nutrition: Goal: Adequate nutrition will be maintained Outcome: Progressing   Problem: Elimination: Goal: Will not experience complications related to bowel motility Outcome: Progressing   Problem: Pain Managment: Goal: General experience of comfort will improve Outcome: Progressing

## 2021-04-05 NOTE — Progress Notes (Signed)
ANTICOAGULATION CONSULT NOTE  Pharmacy Consult for heparin Indication: atrial fibrillation  No Active Allergies  Patient Measurements: Height: 6' (182.9 cm) Weight: 79.6 kg (175 lb 7.8 oz) IBW/kg (Calculated) : 77.6 Heparin Dosing Weight: 77kg  Vital Signs: Temp: 98.2 F (36.8 C) (11/24 0737) Temp Source: Oral (11/24 0737) BP: 145/126 (11/24 0737) Pulse Rate: 114 (11/24 0737)  Labs: Recent Labs    04/03/21 0422 04/03/21 1400 04/04/21 0419 04/04/21 0859 04/04/21 1400 04/05/21 0500  HGB 8.6*  --   --  8.9*  --   --   HCT 28.2*  --   --  29.7*  --   --   PLT 290  --   --  321  --   --   APTT 68*   < > 47*  --  77* 61*  HEPARINUNFRC 0.71*  --  0.34  --   --  0.41  CREATININE 0.77  --  0.79  --   --  0.79   < > = values in this interval not displayed.     Estimated Creatinine Clearance: 98.3 mL/min (by C-G formula based on SCr of 0.79 mg/dL).   Medical History: Past Medical History:  Diagnosis Date   AICD (automatic cardioverter/defibrillator) present 08/31/2019   Ankylosing spondylitis ([place])    Arthritis    BENIGN PROSTATIC HYPERTROPHY 06/07/2008   CHF (congestive heart failure) ([place])    COLONIC POLYPS, HX OF 06/07/2008   Depression    H/O hiatal hernia    Heart failure ([place])    HYPERLIPIDEMIA 06/07/2008   HYPERTENSION 06/07/2008   Myocardial infarction ([place]) 2005   NSTEMI, s/p LAD stent   NEPHROLITHIASIS, HX OF 06/07/2008   STEMI (ST elevation myocardial infarction) ([place]) 04/27/2019    Assessment: 16 yoM with end stage HFrEF with planned LVAD implant on 11/25. Pt is on apixaban for hx DVT and AFib, now to transition to IV heparin in anticipation of surgery.  Last dose of apixaban was 11/20 at 10am - will utilize aPTTs until apixaban affect wears off.  aPTT of 61 sec subtherapeutic after rate increase to 1900 units/hr. CBC stable, level drawn appropriately. No infusion problems per nurse.  Goal of Therapy:  Heparin level 0.3-0.7 units/ml aPTT 66-102  seconds Monitor platelets by anticoagulation protocol: Yes  Plan:  Increase heparin infusion 2000 units/hr 6 hour aPTT Daily CBC, aPTT, heparin level until correlating  Laurey Arrow, PharmD PGY1 Pharmacy Resident 04/05/2021  9:32 AM  Please check [email or web address] for unit-specific pharmacy phone numbers.

## 2021-04-05 NOTE — Progress Notes (Signed)
Mobility Specialist Progress Note:   04/05/21 1206  Mobility  Activity Refused mobility   [facility] Primary Phone [phone] Secondary Phone [phone]

## 2021-04-05 NOTE — Progress Notes (Signed)
8 Days Post-Op Procedure(s) (LRB): RIGHT HEART CATH (N/A) Subjective: No complaints. Feels ready for surgery tomorrow.  Objective: Vital signs in last 24 hours: Temp:  [97.5 F (36.4 C)-98.4 F (36.9 C)] 97.7 F (36.5 C) (11/24 1523) Pulse Rate:  [95-116] 95 (11/24 1523) Cardiac Rhythm: Sinus tachycardia (11/24 0716) Resp:  [18-35] 30 (11/24 1523) BP: (99-145)/(63-126) 99/63 (11/24 1523) SpO2:  [92 %-100 %] 99 % (11/24 1701) Weight:  [79.6 kg] 79.6 kg (11/24 0419)  Hemodynamic parameters for last 24 hours: CVP:  [6 mmHg-9 mmHg] 8 mmHg  Intake/Output from previous day: 11/23 0701 - 11/24 0700 In: 1060 [P.O.:1060] Out: 1775 [Urine:1775] Intake/Output this shift: Total I/O In: 236 [P.O.:236] Out: 1350 [Urine:1350]  General appearance: alert and cooperative Neurologic: intact Heart: regular rate and rhythm and systolic murmur RSB Lungs: clear to auscultation bilaterally Extremities: extremities normal, atraumatic, no cyanosis or edema  Lab Results: Recent Labs    04/04/21 0859 04/05/21 1000  WBC 12.0* 17.6*  HGB 8.9* 9.4*  HCT 29.7* 30.3*  PLT 321 335   BMET:  Recent Labs    04/04/21 0419 04/05/21 0500  NA 129* 128*  K 4.0 4.5  CL 94* 97*  CO2 27 25  GLUCOSE 275* 141*  BUN 28* 26*  CREATININE 0.79 0.79  CALCIUM 8.8* 8.6*    PT/INR: No results for input(s): LABPROT, INR in the last 72 hours. ABG    Component Value Date/Time   PHART 7.451 (H) 03/07/2021 1218   HCO3 33.4 (H) 03/28/2021 0909   HCO3 33.8 (H) 03/28/2021 0909   TCO2 35 (H) 03/28/2021 0909   TCO2 35 (H) 03/28/2021 0909   ACIDBASEDEF 7.0 (H) 11/30/2020 0839   O2SAT 52.8 04/05/2021 1203   CBG (last 3)  No results for input(s): GLUCAP in the last 72 hours.  Assessment/Plan:  He is ready for LVAD insertion and possible bioprosthetic AVR for AS/AI tomorrow. I discussed the operative procedure with the patient including alternatives, benefits and risks; including but not limited to bleeding,  blood transfusion, infection, stroke, myocardial infarction, heart block requiring a permanent pacemaker, organ dysfunction, and death.  Brandon [name] understands and agrees to proceed.    LOS: 28 days    Gaye Pollack 04/05/2021

## 2021-04-05 NOTE — H&P (View-Only) (Signed)
8 Days Post-Op Procedure(s) (LRB): RIGHT HEART CATH (N/A) Subjective: No complaints. Feels ready for surgery tomorrow.  Objective: Vital signs in last 24 hours: Temp:  [97.5 F (36.4 C)-98.4 F (36.9 C)] 97.7 F (36.5 C) (11/24 1523) Pulse Rate:  [95-116] 95 (11/24 1523) Cardiac Rhythm: Sinus tachycardia (11/24 0716) Resp:  [18-35] 30 (11/24 1523) BP: (99-145)/(63-126) 99/63 (11/24 1523) SpO2:  [92 %-100 %] 99 % (11/24 1701) Weight:  [79.6 kg] 79.6 kg (11/24 0419)  Hemodynamic parameters for last 24 hours: CVP:  [6 mmHg-9 mmHg] 8 mmHg  Intake/Output from previous day: 11/23 0701 - 11/24 0700 In: 1060 [P.O.:1060] Out: 1775 [Urine:1775] Intake/Output this shift: Total I/O In: 236 [P.O.:236] Out: 1350 [Urine:1350]  General appearance: alert and cooperative Neurologic: intact Heart: regular rate and rhythm and systolic murmur RSB Lungs: clear to auscultation bilaterally Extremities: extremities normal, atraumatic, no cyanosis or edema  Lab Results: Recent Labs    04/04/21 0859 04/05/21 1000  WBC 12.0* 17.6*  HGB 8.9* 9.4*  HCT 29.7* 30.3*  PLT 321 335   BMET:  Recent Labs    04/04/21 0419 04/05/21 0500  NA 129* 128*  K 4.0 4.5  CL 94* 97*  CO2 27 25  GLUCOSE 275* 141*  BUN 28* 26*  CREATININE 0.79 0.79  CALCIUM 8.8* 8.6*    PT/INR: No results for input(s): LABPROT, INR in the last 72 hours. ABG    Component Value Date/Time   PHART 7.451 (H) 03/07/2021 1218   HCO3 33.4 (H) 03/28/2021 0909   HCO3 33.8 (H) 03/28/2021 0909   TCO2 35 (H) 03/28/2021 0909   TCO2 35 (H) 03/28/2021 0909   ACIDBASEDEF 7.0 (H) 11/30/2020 0839   O2SAT 52.8 04/05/2021 1203   CBG (last 3)  No results for input(s): GLUCAP in the last 72 hours.  Assessment/Plan:  He is ready for LVAD insertion and possible bioprosthetic AVR for AS/AI tomorrow. I discussed the operative procedure with the patient including alternatives, benefits and risks; including but not limited to bleeding,  blood transfusion, infection, stroke, myocardial infarction, heart block requiring a permanent pacemaker, organ dysfunction, and death.  Brandon [name] understands and agrees to proceed.    LOS: 28 days    Gaye Pollack 04/05/2021

## 2021-04-06 ENCOUNTER — Encounter (HOSPITAL_COMMUNITY): Payer: Self-pay | Admitting: Internal Medicine

## 2021-04-06 ENCOUNTER — Inpatient Hospital Stay (HOSPITAL_COMMUNITY): Payer: Medicare Other | Admitting: Certified Registered Nurse Anesthetist

## 2021-04-06 ENCOUNTER — Inpatient Hospital Stay (HOSPITAL_COMMUNITY): Payer: Medicare Other

## 2021-04-06 ENCOUNTER — Inpatient Hospital Stay (HOSPITAL_COMMUNITY): Admission: RE | Admit: 2021-04-06 | Payer: Medicare Other | Source: Home / Self Care | Admitting: Surgery

## 2021-04-06 ENCOUNTER — Inpatient Hospital Stay (HOSPITAL_COMMUNITY)
Admission: EM | Disposition: A | Discharge status: Home Health | Payer: Self-pay | Source: Home / Self Care | Attending: Surgery

## 2021-04-06 DIAGNOSIS — Z95811 Presence of heart assist device: Secondary | ICD-10-CM

## 2021-04-06 DIAGNOSIS — I255 Ischemic cardiomyopathy: Secondary | ICD-10-CM | POA: Diagnosis not present

## 2021-04-06 DIAGNOSIS — I351 Nonrheumatic aortic (valve) insufficiency: Secondary | ICD-10-CM | POA: Diagnosis not present

## 2021-04-06 DIAGNOSIS — I5022 Chronic systolic (congestive) heart failure: Secondary | ICD-10-CM | POA: Diagnosis not present

## 2021-04-06 HISTORY — PX: AORTIC VALVE REPLACEMENT: SHX41

## 2021-04-06 HISTORY — PX: INSERTION OF IMPLANTABLE LEFT VENTRICULAR ASSIST DEVICE: SHX5866

## 2021-04-06 HISTORY — PX: TEE WITHOUT CARDIOVERSION: SHX5443

## 2021-04-06 LAB — BASIC METABOLIC PANEL
Anion gap: 11 (ref 5–15)
Anion gap: 11 (ref 5–15)
Anion gap: 9 (ref 5–15)
BUN: 29 mg/dL — ABNORMAL HIGH (ref 8–23)
BUN: 24 mg/dL — ABNORMAL HIGH (ref 8–23)
BUN: 27 mg/dL — ABNORMAL HIGH (ref 8–23)
CO2: 31 mmol/L (ref 22–32)
CO2: 30 mmol/L (ref 22–32)
CO2: 29 mmol/L (ref 22–32)
Calcium: 8.7 mg/dL — ABNORMAL LOW (ref 8.9–10.3)
Calcium: 7.7 mg/dL — ABNORMAL LOW (ref 8.9–10.3)
Calcium: 8.3 mg/dL — ABNORMAL LOW (ref 8.9–10.3)
Chloride: 86 mmol/L — ABNORMAL LOW (ref 98–111)
Chloride: 95 mmol/L — ABNORMAL LOW (ref 98–111)
Chloride: 96 mmol/L — ABNORMAL LOW (ref 98–111)
Creatinine, Ser: 0.76 mg/dL (ref 0.61–1.24)
Creatinine, Ser: 0.97 mg/dL (ref 0.61–1.24)
Creatinine, Ser: 0.96 mg/dL (ref 0.61–1.24)
GFR, Estimated: >60 mL/min (ref >60)
GFR, Estimated: >60 mL/min (ref >60)
GFR, Estimated: >60 mL/min (ref >60)
Glucose, Bld: 183 mg/dL — ABNORMAL HIGH (ref 70–99)
Glucose, Bld: 111 mg/dL — ABNORMAL HIGH (ref 70–99)
Glucose, Bld: 130 mg/dL — ABNORMAL HIGH (ref 70–99)
Potassium: 2.4 mmol/L — CL (ref 3.5–5.1)
Potassium: 2.2 mmol/L — CL (ref 3.5–5.1)
Potassium: 2.8 mmol/L — ABNORMAL LOW (ref 3.5–5.1)
Sodium: 128 mmol/L — ABNORMAL LOW (ref 135–145)
Sodium: 136 mmol/L (ref 135–145)
Sodium: 134 mmol/L — ABNORMAL LOW (ref 135–145)

## 2021-04-06 LAB — PREPARE RBC (CROSSMATCH)

## 2021-04-06 LAB — POCT I-STAT 7, (LYTES, BLD GAS, ICA,H+H)
Acid-Base Excess: 8 mmol/L — ABNORMAL HIGH (ref 0.0–2.0)
Acid-Base Excess: 7 mmol/L — ABNORMAL HIGH (ref 0.0–2.0)
Acid-Base Excess: 0 mmol/L (ref 0.0–2.0)
Acid-Base Excess: 0 mmol/L (ref 0.0–2.0)
Acid-Base Excess: 1 mmol/L (ref 0.0–2.0)
Acid-Base Excess: 4 mmol/L — ABNORMAL HIGH (ref 0.0–2.0)
Acid-Base Excess: 3 mmol/L — ABNORMAL HIGH (ref 0.0–2.0)
Bicarbonate: 34.6 mmol/L — ABNORMAL HIGH (ref 20.0–28.0)
Bicarbonate: 32.4 mmol/L — ABNORMAL HIGH (ref 20.0–28.0)
Bicarbonate: 25.6 mmol/L (ref 20.0–28.0)
Bicarbonate: 25.1 mmol/L (ref 20.0–28.0)
Bicarbonate: 28 mmol/L (ref 20.0–28.0)
Bicarbonate: 28.9 mmol/L — ABNORMAL HIGH (ref 20.0–28.0)
Bicarbonate: 30.6 mmol/L — ABNORMAL HIGH (ref 20.0–28.0)
Calcium, Ion: 1.23 mmol/L (ref 1.15–1.40)
Calcium, Ion: 1.04 mmol/L — ABNORMAL LOW (ref 1.15–1.40)
Calcium, Ion: 1.02 mmol/L — ABNORMAL LOW (ref 1.15–1.40)
Calcium, Ion: 1 mmol/L — ABNORMAL LOW (ref 1.15–1.40)
Calcium, Ion: 1.06 mmol/L — ABNORMAL LOW (ref 1.15–1.40)
Calcium, Ion: 1.09 mmol/L — ABNORMAL LOW (ref 1.15–1.40)
Calcium, Ion: 1.03 mmol/L — ABNORMAL LOW (ref 1.15–1.40)
HCT: 27 % — ABNORMAL LOW (ref 39.0–52.0)
HCT: 23 % — ABNORMAL LOW (ref 39.0–52.0)
HCT: 26 % — ABNORMAL LOW (ref 39.0–52.0)
HCT: 25 % — ABNORMAL LOW (ref 39.0–52.0)
HCT: 24 % — ABNORMAL LOW (ref 39.0–52.0)
HCT: 27 % — ABNORMAL LOW (ref 39.0–52.0)
HCT: 31 % — ABNORMAL LOW (ref 39.0–52.0)
Hemoglobin: 9.2 g/dL — ABNORMAL LOW (ref 13.0–17.0)
Hemoglobin: 7.8 g/dL — ABNORMAL LOW (ref 13.0–17.0)
Hemoglobin: 8.8 g/dL — ABNORMAL LOW (ref 13.0–17.0)
Hemoglobin: 8.5 g/dL — ABNORMAL LOW (ref 13.0–17.0)
Hemoglobin: 8.2 g/dL — ABNORMAL LOW (ref 13.0–17.0)
Hemoglobin: 9.2 g/dL — ABNORMAL LOW (ref 13.0–17.0)
Hemoglobin: 10.5 g/dL — ABNORMAL LOW (ref 13.0–17.0)
O2 Saturation: 100 %
O2 Saturation: 100 %
O2 Saturation: 100 %
O2 Saturation: 100 %
O2 Saturation: 100 %
O2 Saturation: 100 %
O2 Saturation: 100 %
Potassium: 2.5 mmol/L — CL (ref 3.5–5.1)
Potassium: 2.4 mmol/L — CL (ref 3.5–5.1)
Potassium: 2.3 mmol/L — CL (ref 3.5–5.1)
Potassium: 3 mmol/L — ABNORMAL LOW (ref 3.5–5.1)
Potassium: 2.1 mmol/L — CL (ref 3.5–5.1)
Potassium: 2.6 mmol/L — CL (ref 3.5–5.1)
Potassium: 2 mmol/L — CL (ref 3.5–5.1)
Sodium: 131 mmol/L — ABNORMAL LOW (ref 135–145)
Sodium: 130 mmol/L — ABNORMAL LOW (ref 135–145)
Sodium: 129 mmol/L — ABNORMAL LOW (ref 135–145)
Sodium: 132 mmol/L — ABNORMAL LOW (ref 135–145)
Sodium: 136 mmol/L (ref 135–145)
Sodium: 137 mmol/L (ref 135–145)
Sodium: 137 mmol/L (ref 135–145)
TCO2: 36 mmol/L — ABNORMAL HIGH (ref 22–32)
TCO2: 34 mmol/L — ABNORMAL HIGH (ref 22–32)
TCO2: 27 mmol/L (ref 22–32)
TCO2: 26 mmol/L (ref 22–32)
TCO2: 30 mmol/L (ref 22–32)
TCO2: 30 mmol/L (ref 22–32)
TCO2: 32 mmol/L (ref 22–32)
pCO2 arterial: 58.3 mmHg — ABNORMAL HIGH (ref 32.0–48.0)
pCO2 arterial: 49.9 mmHg — ABNORMAL HIGH (ref 32.0–48.0)
pCO2 arterial: 43.2 mmHg (ref 32.0–48.0)
pCO2 arterial: 41.9 mmHg (ref 32.0–48.0)
pCO2 arterial: 44.1 mmHg (ref 32.0–48.0)
pCO2 arterial: 60.3 mmHg — ABNORMAL HIGH (ref 32.0–48.0)
pCO2 arterial: 60.6 mmHg — ABNORMAL HIGH (ref 32.0–48.0)
pH, Arterial: 7.382 (ref 7.350–7.450)
pH, Arterial: 7.42 (ref 7.350–7.450)
pH, Arterial: 7.38 (ref 7.350–7.450)
pH, Arterial: 7.386 (ref 7.350–7.450)
pH, Arterial: 7.275 — ABNORMAL LOW (ref 7.350–7.450)
pH, Arterial: 7.424 (ref 7.350–7.450)
pH, Arterial: 7.311 — ABNORMAL LOW (ref 7.350–7.450)
pO2, Arterial: 182 mmHg — ABNORMAL HIGH (ref 83.0–108.0)
pO2, Arterial: 302 mmHg — ABNORMAL HIGH (ref 83.0–108.0)
pO2, Arterial: 287 mmHg — ABNORMAL HIGH (ref 83.0–108.0)
pO2, Arterial: 418 mmHg — ABNORMAL HIGH (ref 83.0–108.0)
pO2, Arterial: 441 mmHg — ABNORMAL HIGH (ref 83.0–108.0)
pO2, Arterial: 452 mmHg — ABNORMAL HIGH (ref 83.0–108.0)
pO2, Arterial: 416 mmHg — ABNORMAL HIGH (ref 83.0–108.0)

## 2021-04-06 LAB — GLUCOSE, CAPILLARY
Glucose-Capillary: 112 mg/dL — ABNORMAL HIGH (ref 70–99)
Glucose-Capillary: 104 mg/dL — ABNORMAL HIGH (ref 70–99)
Glucose-Capillary: 119 mg/dL — ABNORMAL HIGH (ref 70–99)
Glucose-Capillary: 121 mg/dL — ABNORMAL HIGH (ref 70–99)
Glucose-Capillary: 117 mg/dL — ABNORMAL HIGH (ref 70–99)
Glucose-Capillary: 142 mg/dL — ABNORMAL HIGH (ref 70–99)
Glucose-Capillary: 127 mg/dL — ABNORMAL HIGH (ref 70–99)

## 2021-04-06 LAB — POCT I-STAT, CHEM 8
BUN: 25 mg/dL — ABNORMAL HIGH (ref 8–23)
BUN: 24 mg/dL — ABNORMAL HIGH (ref 8–23)
BUN: 26 mg/dL — ABNORMAL HIGH (ref 8–23)
BUN: 25 mg/dL — ABNORMAL HIGH (ref 8–23)
BUN: 25 mg/dL — ABNORMAL HIGH (ref 8–23)
BUN: 25 mg/dL — ABNORMAL HIGH (ref 8–23)
BUN: 24 mg/dL — ABNORMAL HIGH (ref 8–23)
Calcium, Ion: 1.21 mmol/L (ref 1.15–1.40)
Calcium, Ion: 1.05 mmol/L — ABNORMAL LOW (ref 1.15–1.40)
Calcium, Ion: 1.14 mmol/L — ABNORMAL LOW (ref 1.15–1.40)
Calcium, Ion: 1.07 mmol/L — ABNORMAL LOW (ref 1.15–1.40)
Calcium, Ion: 0.89 mmol/L — CL (ref 1.15–1.40)
Calcium, Ion: 1.11 mmol/L — ABNORMAL LOW (ref 1.15–1.40)
Calcium, Ion: 1.01 mmol/L — ABNORMAL LOW (ref 1.15–1.40)
Chloride: 86 mmol/L — ABNORMAL LOW (ref 98–111)
Chloride: 87 mmol/L — ABNORMAL LOW (ref 98–111)
Chloride: 87 mmol/L — ABNORMAL LOW (ref 98–111)
Chloride: 89 mmol/L — ABNORMAL LOW (ref 98–111)
Chloride: 89 mmol/L — ABNORMAL LOW (ref 98–111)
Chloride: 90 mmol/L — ABNORMAL LOW (ref 98–111)
Chloride: 91 mmol/L — ABNORMAL LOW (ref 98–111)
Creatinine, Ser: 0.7 mg/dL (ref 0.61–1.24)
Creatinine, Ser: 0.7 mg/dL (ref 0.61–1.24)
Creatinine, Ser: 0.7 mg/dL (ref 0.61–1.24)
Creatinine, Ser: 0.7 mg/dL (ref 0.61–1.24)
Creatinine, Ser: 0.8 mg/dL (ref 0.61–1.24)
Creatinine, Ser: 0.8 mg/dL (ref 0.61–1.24)
Creatinine, Ser: 0.8 mg/dL (ref 0.61–1.24)
Glucose, Bld: 120 mg/dL — ABNORMAL HIGH (ref 70–99)
Glucose, Bld: 187 mg/dL — ABNORMAL HIGH (ref 70–99)
Glucose, Bld: 274 mg/dL — ABNORMAL HIGH (ref 70–99)
Glucose, Bld: 278 mg/dL — ABNORMAL HIGH (ref 70–99)
Glucose, Bld: 228 mg/dL — ABNORMAL HIGH (ref 70–99)
Glucose, Bld: 175 mg/dL — ABNORMAL HIGH (ref 70–99)
Glucose, Bld: 174 mg/dL — ABNORMAL HIGH (ref 70–99)
HCT: 30 % — ABNORMAL LOW (ref 39.0–52.0)
HCT: 24 % — ABNORMAL LOW (ref 39.0–52.0)
HCT: 25 % — ABNORMAL LOW (ref 39.0–52.0)
HCT: 26 % — ABNORMAL LOW (ref 39.0–52.0)
HCT: 20 % — ABNORMAL LOW (ref 39.0–52.0)
HCT: 29 % — ABNORMAL LOW (ref 39.0–52.0)
HCT: 30 % — ABNORMAL LOW (ref 39.0–52.0)
Hemoglobin: 10.2 g/dL — ABNORMAL LOW (ref 13.0–17.0)
Hemoglobin: 8.2 g/dL — ABNORMAL LOW (ref 13.0–17.0)
Hemoglobin: 8.5 g/dL — ABNORMAL LOW (ref 13.0–17.0)
Hemoglobin: 8.8 g/dL — ABNORMAL LOW (ref 13.0–17.0)
Hemoglobin: 6.8 g/dL — CL (ref 13.0–17.0)
Hemoglobin: 9.9 g/dL — ABNORMAL LOW (ref 13.0–17.0)
Hemoglobin: 10.2 g/dL — ABNORMAL LOW (ref 13.0–17.0)
Potassium: 2.5 mmol/L — CL (ref 3.5–5.1)
Potassium: 2.4 mmol/L — CL (ref 3.5–5.1)
Potassium: 2.4 mmol/L — CL (ref 3.5–5.1)
Potassium: 2.8 mmol/L — ABNORMAL LOW (ref 3.5–5.1)
Potassium: 2.7 mmol/L — CL (ref 3.5–5.1)
Potassium: 2 mmol/L — CL (ref 3.5–5.1)
Potassium: 2 mmol/L — CL (ref 3.5–5.1)
Sodium: 131 mmol/L — ABNORMAL LOW (ref 135–145)
Sodium: 129 mmol/L — ABNORMAL LOW (ref 135–145)
Sodium: 130 mmol/L — ABNORMAL LOW (ref 135–145)
Sodium: 130 mmol/L — ABNORMAL LOW (ref 135–145)
Sodium: 136 mmol/L (ref 135–145)
Sodium: 137 mmol/L (ref 135–145)
Sodium: 138 mmol/L (ref 135–145)
TCO2: 31 mmol/L (ref 22–32)
TCO2: 28 mmol/L (ref 22–32)
TCO2: 30 mmol/L (ref 22–32)
TCO2: 24 mmol/L (ref 22–32)
TCO2: 27 mmol/L (ref 22–32)
TCO2: 27 mmol/L (ref 22–32)
TCO2: 29 mmol/L (ref 22–32)

## 2021-04-06 LAB — POCT I-STAT EG7
Acid-Base Excess: 5 mmol/L — ABNORMAL HIGH (ref 0.0–2.0)
Bicarbonate: 30.6 mmol/L — ABNORMAL HIGH (ref 20.0–28.0)
Calcium, Ion: 1.08 mmol/L — ABNORMAL LOW (ref 1.15–1.40)
HCT: 22 % — ABNORMAL LOW (ref 39.0–52.0)
Hemoglobin: 7.5 g/dL — ABNORMAL LOW (ref 13.0–17.0)
O2 Saturation: 72 %
Potassium: 3.2 mmol/L — ABNORMAL LOW (ref 3.5–5.1)
Sodium: 130 mmol/L — ABNORMAL LOW (ref 135–145)
TCO2: 32 mmol/L (ref 22–32)
pCO2, Ven: 54.4 mmHg (ref 44.0–60.0)
pH, Ven: 7.358 (ref 7.250–7.430)
pO2, Ven: 41 mmHg (ref 32.0–45.0)

## 2021-04-06 LAB — CBC
HCT: 26.5 % — ABNORMAL LOW (ref 39.0–52.0)
HCT: 27.8 % — ABNORMAL LOW (ref 39.0–52.0)
Hemoglobin: 8.3 g/dL — ABNORMAL LOW (ref 13.0–17.0)
Hemoglobin: 8.8 g/dL — ABNORMAL LOW (ref 13.0–17.0)
MCH: 24.8 pg — ABNORMAL LOW (ref 26.0–34.0)
MCH: 25.5 pg — ABNORMAL LOW (ref 26.0–34.0)
MCHC: 31.3 g/dL (ref 30.0–36.0)
MCHC: 31.7 g/dL (ref 30.0–36.0)
MCV: 79.1 fL — ABNORMAL LOW (ref 80.0–100.0)
MCV: 80.6 fL (ref 80.0–100.0)
Platelets: 301 10*3/uL (ref 150–400)
Platelets: 110 10*3/uL — ABNORMAL LOW (ref 150–400)
RBC: 3.35 MIL/uL — ABNORMAL LOW (ref 4.22–5.81)
RBC: 3.45 MIL/uL — ABNORMAL LOW (ref 4.22–5.81)
RDW: 19.9 % — ABNORMAL HIGH (ref 11.5–15.5)
RDW: 19 % — ABNORMAL HIGH (ref 11.5–15.5)
WBC: 10.2 10*3/uL (ref 4.0–10.5)
WBC: 23.9 10*3/uL — ABNORMAL HIGH (ref 4.0–10.5)
nRBC: 0 % (ref 0.0–0.2)
nRBC: 0 % (ref 0.0–0.2)

## 2021-04-06 LAB — HEPARIN LEVEL (UNFRACTIONATED): Heparin Unfractionated: 0.27 IU/mL — ABNORMAL LOW (ref 0.30–0.70)

## 2021-04-06 LAB — COOXEMETRY PANEL
Carboxyhemoglobin: 1.8 % — ABNORMAL HIGH (ref 0.5–1.5)
Carboxyhemoglobin: 2.1 % — ABNORMAL HIGH (ref 0.5–1.5)
Methemoglobin: 0.8 % (ref 0.0–1.5)
Methemoglobin: 1.3 % (ref 0.0–1.5)
O2 Saturation: 59.1 %
O2 Saturation: 58.3 %
Total hemoglobin: 11.8 g/dL — ABNORMAL LOW (ref 12.0–16.0)
Total hemoglobin: 8.9 g/dL — ABNORMAL LOW (ref 12.0–16.0)

## 2021-04-06 LAB — PROTIME-INR
INR: 1.6 — ABNORMAL HIGH (ref 0.8–1.2)
Prothrombin Time: 19.3 seconds — ABNORMAL HIGH (ref 11.4–15.2)

## 2021-04-06 LAB — MAGNESIUM
Magnesium: 2 mg/dL (ref 1.7–2.4)
Magnesium: 2 mg/dL (ref 1.7–2.4)
Magnesium: 2.6 mg/dL — ABNORMAL HIGH (ref 1.7–2.4)

## 2021-04-06 LAB — APTT
aPTT: 64 seconds — ABNORMAL HIGH (ref 24–36)
aPTT: 46 seconds — ABNORMAL HIGH (ref 24–36)

## 2021-04-06 LAB — PLATELET COUNT: Platelets: 306 10*3/uL (ref 150–400)

## 2021-04-06 LAB — HEMOGLOBIN AND HEMATOCRIT, BLOOD
HCT: 24 % — ABNORMAL LOW (ref 39.0–52.0)
Hemoglobin: 7.4 g/dL — ABNORMAL LOW (ref 13.0–17.0)

## 2021-04-06 LAB — FIBRINOGEN: Fibrinogen: 473 mg/dL (ref 210–475)

## 2021-04-06 LAB — POTASSIUM: Potassium: 2.8 mmol/L — ABNORMAL LOW (ref 3.5–5.1)

## 2021-04-06 SURGERY — INSERTION OF IMPLANTABLE LEFT VENTRICULAR ASSIST DEVICE
Anesthesia: General | Site: Chest

## 2021-04-06 MED ORDER — TRAMADOL HCL 50 MG PO TABS: 50.0000 mg | ORAL_TABLET | ORAL | Status: DC | PRN

## 2021-04-06 MED ORDER — POTASSIUM CHLORIDE 10 MEQ/50ML IV SOLN
10.0000 meq | INTRAVENOUS | Status: AC
  Administered 2021-04-06 – 2021-04-07 (×4): 10 meq via INTRAVENOUS
  Filled 2021-04-06 (×4): qty 50

## 2021-04-06 MED ORDER — VANCOMYCIN HCL 1000 MG IV SOLR
INTRAVENOUS | Status: AC
  Filled 2021-04-06: qty 20

## 2021-04-06 MED ORDER — ASPIRIN 81 MG PO CHEW
324.0000 mg | CHEWABLE_TABLET | Freq: Every day | ORAL | Status: DC
  Administered 2021-04-07: 324 mg
  Filled 2021-04-06: qty 4

## 2021-04-06 MED ORDER — ETOMIDATE 2 MG/ML IV SOLN
INTRAVENOUS | Status: AC
  Filled 2021-04-06: qty 10

## 2021-04-06 MED ORDER — ASPIRIN 300 MG RE SUPP: 300.0000 mg | Freq: Every day | RECTAL | Status: DC

## 2021-04-06 MED ORDER — FENTANYL CITRATE (PF) 250 MCG/5ML IJ SOLN
INTRAMUSCULAR | Status: AC
  Filled 2021-04-06: qty 15

## 2021-04-06 MED ORDER — METOCLOPRAMIDE HCL 5 MG/ML IJ SOLN
10.0000 mg | Freq: Four times a day (QID) | INTRAMUSCULAR | Status: AC
  Administered 2021-04-06 – 2021-04-11 (×20): 10 mg via INTRAVENOUS
  Filled 2021-04-06 (×20): qty 2

## 2021-04-06 MED ORDER — MIDAZOLAM HCL (PF) 10 MG/2ML IJ SOLN
INTRAMUSCULAR | Status: AC
  Filled 2021-04-06: qty 2

## 2021-04-06 MED ORDER — VANCOMYCIN HCL IN DEXTROSE 1-5 GM/200ML-% IV SOLN: 1000.0000 mg | Freq: Two times a day (BID) | INTRAVENOUS | Status: DC

## 2021-04-06 MED ORDER — LACTATED RINGERS IV SOLN: INTRAVENOUS | Status: DC | PRN

## 2021-04-06 MED ORDER — CALCIUM CHLORIDE 10 % IV SOLN
1.0000 g | Freq: Once | INTRAVENOUS | Status: AC
  Administered 2021-04-06: 1 g via INTRAVENOUS

## 2021-04-06 MED ORDER — MORPHINE SULFATE (PF) 2 MG/ML IV SOLN
1.0000 mg | INTRAVENOUS | Status: DC | PRN
  Administered 2021-04-06 – 2021-04-20 (×28): 2 mg via INTRAVENOUS
  Administered 2021-04-20: 1 mg via INTRAVENOUS
  Administered 2021-04-21 (×2): 2 mg via INTRAVENOUS
  Administered 2021-04-21: 1 mg via INTRAVENOUS
  Administered 2021-04-22 – 2021-04-24 (×7): 2 mg via INTRAVENOUS
  Filled 2021-04-06 (×13): qty 1
  Filled 2021-04-06: qty 2
  Filled 2021-04-06: qty 1
  Filled 2021-04-06: qty 2
  Filled 2021-04-06 (×23): qty 1

## 2021-04-06 MED ORDER — MILRINONE LACTATE IN DEXTROSE 20-5 MG/100ML-% IV SOLN
0.1250 ug/kg/min | INTRAVENOUS | Status: DC
  Administered 2021-04-07 – 2021-04-11 (×4): 0.125 ug/kg/min via INTRAVENOUS
  Filled 2021-04-06 (×5): qty 100

## 2021-04-06 MED ORDER — NOREPINEPHRINE BITARTRATE 1 MG/ML IV SOLN
INTRAVENOUS | Status: DC | PRN
  Administered 2021-04-06 (×4): .5 mL via INTRAVENOUS
  Administered 2021-04-06: 1 mL via INTRAVENOUS
  Administered 2021-04-06: .5 mL via INTRAVENOUS
  Administered 2021-04-06: 1 mL via INTRAVENOUS
  Administered 2021-04-06: .5 mL via INTRAVENOUS
  Administered 2021-04-06: 1.5 mL via INTRAVENOUS
  Administered 2021-04-06: 1 mL via INTRAVENOUS
  Administered 2021-04-06: .5 mL via INTRAVENOUS
  Administered 2021-04-06 (×2): 1 mL via INTRAVENOUS
  Administered 2021-04-06: .5 mL via INTRAVENOUS

## 2021-04-06 MED ORDER — ASPIRIN EC 325 MG PO TBEC
325.0000 mg | DELAYED_RELEASE_TABLET | Freq: Every day | ORAL | Status: DC
  Administered 2021-04-08 – 2021-04-12 (×5): 325 mg via ORAL
  Filled 2021-04-06 (×5): qty 1

## 2021-04-06 MED ORDER — NOREPINEPHRINE 16 MG/250ML-% IV SOLN
0.0000 ug/min | INTRAVENOUS | Status: DC
  Administered 2021-04-07: 13 ug/min via INTRAVENOUS
  Administered 2021-04-07: 21 ug/min via INTRAVENOUS
  Administered 2021-04-09: 15:00:00 7 ug/min via INTRAVENOUS
  Administered 2021-04-10: 12 ug/min via INTRAVENOUS
  Administered 2021-04-12: 10:00:00 8 ug/min via INTRAVENOUS
  Administered 2021-04-14: 01:00:00 5 ug/min via INTRAVENOUS
  Filled 2021-04-06 (×5): qty 250
  Filled 2021-04-06: qty 500

## 2021-04-06 MED ORDER — THROMBIN (RECOMBINANT) 20000 UNITS EX SOLR
CUTANEOUS | Status: AC
  Filled 2021-04-06: qty 20000

## 2021-04-06 MED ORDER — EPINEPHRINE HCL 5 MG/250ML IV SOLN IN NS
0.0000 ug/min | INTRAVENOUS | Status: DC
  Administered 2021-04-08 – 2021-04-11 (×4): 3 ug/min via INTRAVENOUS
  Filled 2021-04-06 (×5): qty 250

## 2021-04-06 MED ORDER — CHLORHEXIDINE GLUCONATE CLOTH 2 % EX PADS
6.0000 | MEDICATED_PAD | Freq: Every day | CUTANEOUS | Status: DC
  Administered 2021-04-07 – 2021-04-28 (×19): 6 via TOPICAL

## 2021-04-06 MED ORDER — BISACODYL 10 MG RE SUPP: 10.0000 mg | Freq: Every day | RECTAL | Status: DC

## 2021-04-06 MED ORDER — ACETAMINOPHEN 160 MG/5ML PO SOLN: 650.0000 mg | Freq: Once | ORAL | Status: AC

## 2021-04-06 MED ORDER — ROCURONIUM BROMIDE 10 MG/ML (PF) SYRINGE
PREFILLED_SYRINGE | INTRAVENOUS | Status: DC | PRN
  Administered 2021-04-06 (×2): 50 mg via INTRAVENOUS
  Administered 2021-04-06: 20 mg via INTRAVENOUS
  Administered 2021-04-06: 70 mg via INTRAVENOUS
  Administered 2021-04-06: 30 mg via INTRAVENOUS

## 2021-04-06 MED ORDER — ROCURONIUM BROMIDE 10 MG/ML (PF) SYRINGE
PREFILLED_SYRINGE | INTRAVENOUS | Status: AC
  Filled 2021-04-06: qty 10

## 2021-04-06 MED ORDER — THROMBIN 20000 UNITS EX SOLR
OROMUCOSAL | Status: DC | PRN
  Administered 2021-04-06 (×3): 4 mL via TOPICAL

## 2021-04-06 MED ORDER — SODIUM CHLORIDE 0.9% IV SOLUTION: Freq: Once | INTRAVENOUS | Status: DC

## 2021-04-06 MED ORDER — 0.9 % SODIUM CHLORIDE (POUR BTL) OPTIME
TOPICAL | Status: DC | PRN
  Administered 2021-04-06: 5000 mL
  Administered 2021-04-06: 1000 mL

## 2021-04-06 MED ORDER — CHLORHEXIDINE GLUCONATE 0.12 % MT SOLN
15.0000 mL | OROMUCOSAL | Status: AC
  Administered 2021-04-06: 15 mL via OROMUCOSAL

## 2021-04-06 MED ORDER — SODIUM CHLORIDE 0.9% FLUSH
3.0000 mL | INTRAVENOUS | Status: DC | PRN
  Administered 2021-05-02: 3 mL via INTRAVENOUS

## 2021-04-06 MED ORDER — DEXTROSE 50 % IV SOLN: 0.0000 mL | INTRAVENOUS | Status: DC | PRN

## 2021-04-06 MED ORDER — SODIUM CHLORIDE 0.9 % IV SOLN: 250.0000 mL | INTRAVENOUS | Status: DC

## 2021-04-06 MED ORDER — PHENYLEPHRINE 40 MCG/ML (10ML) SYRINGE FOR IV PUSH (FOR BLOOD PRESSURE SUPPORT)
PREFILLED_SYRINGE | INTRAVENOUS | Status: DC | PRN
  Administered 2021-04-06: 80 ug via INTRAVENOUS
  Administered 2021-04-06: 40 ug via INTRAVENOUS
  Administered 2021-04-06 (×2): 80 ug via INTRAVENOUS
  Administered 2021-04-06: 40 ug via INTRAVENOUS
  Administered 2021-04-06: 80 ug via INTRAVENOUS

## 2021-04-06 MED ORDER — PROTAMINE SULFATE 10 MG/ML IV SOLN
INTRAVENOUS | Status: DC | PRN
  Administered 2021-04-06: 250 mg via INTRAVENOUS

## 2021-04-06 MED ORDER — PANTOPRAZOLE SODIUM 40 MG PO TBEC
40.0000 mg | DELAYED_RELEASE_TABLET | Freq: Every day | ORAL | Status: DC
  Administered 2021-04-08 – 2021-05-09 (×32): 40 mg via ORAL
  Filled 2021-04-06 (×31): qty 1

## 2021-04-06 MED ORDER — ACETAMINOPHEN 650 MG RE SUPP
650.0000 mg | Freq: Once | RECTAL | Status: AC
  Administered 2021-04-06: 650 mg via RECTAL

## 2021-04-06 MED ORDER — VASOPRESSIN 20 UNIT/ML IV SOLN
INTRAVENOUS | Status: AC
  Filled 2021-04-06: qty 1

## 2021-04-06 MED ORDER — SODIUM CHLORIDE 0.9% FLUSH
10.0000 mL | INTRAVENOUS | Status: DC | PRN
  Administered 2021-04-16: 22:00:00 10 mL

## 2021-04-06 MED ORDER — HEPARIN 6000 UNIT IRRIGATION SOLUTION
Status: DC | PRN
  Administered 2021-04-06: 1

## 2021-04-06 MED ORDER — SODIUM CHLORIDE 0.9% IV SOLUTION: Freq: Once | INTRAVENOUS | Status: AC

## 2021-04-06 MED ORDER — SODIUM CHLORIDE 0.45 % IV SOLN: INTRAVENOUS | Status: DC | PRN

## 2021-04-06 MED ORDER — SODIUM CHLORIDE 0.9% FLUSH
10.0000 mL | Freq: Two times a day (BID) | INTRAVENOUS | Status: DC
  Administered 2021-04-06 – 2021-04-21 (×25): 10 mL

## 2021-04-06 MED ORDER — MIDAZOLAM HCL 2 MG/2ML IJ SOLN: 2.0000 mg | INTRAMUSCULAR | Status: DC | PRN

## 2021-04-06 MED ORDER — VASOPRESSIN 20 UNITS/100 ML INFUSION FOR SHOCK
0.0000 [IU]/min | INTRAVENOUS | Status: DC
  Administered 2021-04-06 – 2021-04-07 (×2): 0.04 [IU]/min via INTRAVENOUS
  Filled 2021-04-06: qty 200
  Filled 2021-04-06: qty 100

## 2021-04-06 MED ORDER — SODIUM CHLORIDE 0.9% FLUSH
3.0000 mL | Freq: Two times a day (BID) | INTRAVENOUS | Status: DC
  Administered 2021-04-07 – 2021-04-15 (×12): 3 mL via INTRAVENOUS
  Administered 2021-04-16: 10:00:00 10 mL via INTRAVENOUS
  Administered 2021-04-17 – 2021-04-21 (×4): 3 mL via INTRAVENOUS

## 2021-04-06 MED ORDER — ALBUMIN HUMAN 5 % IV SOLN
250.0000 mL | INTRAVENOUS | Status: AC | PRN
  Administered 2021-04-06 (×2): 12.5 g via INTRAVENOUS

## 2021-04-06 MED ORDER — ONDANSETRON HCL 4 MG/2ML IJ SOLN
4.0000 mg | Freq: Four times a day (QID) | INTRAMUSCULAR | Status: DC | PRN
  Administered 2021-04-13 – 2021-05-04 (×16): 4 mg via INTRAVENOUS
  Filled 2021-04-06 (×16): qty 2

## 2021-04-06 MED ORDER — THROMBIN 20000 UNITS EX SOLR
CUTANEOUS | Status: DC | PRN
  Administered 2021-04-06: 20000 [IU] via TOPICAL

## 2021-04-06 MED ORDER — LACTATED RINGERS IV SOLN: INTRAVENOUS | Status: DC

## 2021-04-06 MED ORDER — FAMOTIDINE IN NACL 20-0.9 MG/50ML-% IV SOLN
20.0000 mg | Freq: Two times a day (BID) | INTRAVENOUS | Status: DC
  Administered 2021-04-06: 20 mg via INTRAVENOUS
  Filled 2021-04-06 (×2): qty 50

## 2021-04-06 MED ORDER — ALBUMIN HUMAN 5 % IV SOLN: INTRAVENOUS | Status: DC | PRN

## 2021-04-06 MED ORDER — NOREPINEPHRINE 4 MG/250ML-% IV SOLN: 0.0000 ug/min | INTRAVENOUS | Status: DC

## 2021-04-06 MED ORDER — HEPARIN SODIUM (PORCINE) 1000 UNIT/ML IJ SOLN
INTRAMUSCULAR | Status: AC
  Filled 2021-04-06: qty 1

## 2021-04-06 MED ORDER — INSULIN REGULAR(HUMAN) IN NACL 100-0.9 UT/100ML-% IV SOLN
INTRAVENOUS | Status: DC
  Administered 2021-04-06: 1.9 [IU]/h via INTRAVENOUS
  Filled 2021-04-06 (×2): qty 100

## 2021-04-06 MED ORDER — HYDROCORTISONE SOD SUC (PF) 100 MG IJ SOLR
INTRAMUSCULAR | Status: DC | PRN
  Administered 2021-04-06: 250 mg via INTRAVENOUS

## 2021-04-06 MED ORDER — SODIUM CHLORIDE 0.9 % IV SOLN: INTRAVENOUS | Status: DC

## 2021-04-06 MED ORDER — OXYCODONE HCL 5 MG PO TABS
5.0000 mg | ORAL_TABLET | ORAL | Status: DC | PRN
  Administered 2021-04-06 – 2021-04-07 (×4): 10 mg via ORAL
  Administered 2021-04-08 (×3): 5 mg via ORAL
  Administered 2021-04-08: 02:00:00 10 mg via ORAL
  Administered 2021-04-09: 01:00:00 5 mg via ORAL
  Administered 2021-04-09: 11:00:00 10 mg via ORAL
  Administered 2021-04-09 (×2): 5 mg via ORAL
  Administered 2021-04-10 (×2): 10 mg via ORAL
  Administered 2021-04-10: 5 mg via ORAL
  Administered 2021-04-11 – 2021-05-09 (×100): 10 mg via ORAL
  Filled 2021-04-06: qty 1
  Filled 2021-04-06 (×7): qty 2
  Filled 2021-04-06: qty 1
  Filled 2021-04-06 (×3): qty 2
  Filled 2021-04-06: qty 1
  Filled 2021-04-06 (×11): qty 2
  Filled 2021-04-06: qty 1
  Filled 2021-04-06 (×3): qty 2
  Filled 2021-04-06: qty 1
  Filled 2021-04-06 (×17): qty 2
  Filled 2021-04-06 (×2): qty 1
  Filled 2021-04-06 (×7): qty 2
  Filled 2021-04-06: qty 1
  Filled 2021-04-06 (×2): qty 2
  Filled 2021-04-06: qty 1
  Filled 2021-04-06 (×8): qty 2
  Filled 2021-04-06: qty 1
  Filled 2021-04-06 (×2): qty 2
  Filled 2021-04-06: qty 1
  Filled 2021-04-06 (×8): qty 2
  Filled 2021-04-06: qty 1
  Filled 2021-04-06 (×39): qty 2

## 2021-04-06 MED ORDER — FENTANYL CITRATE (PF) 250 MCG/5ML IJ SOLN
INTRAMUSCULAR | Status: DC | PRN
  Administered 2021-04-06 (×3): 50 ug via INTRAVENOUS
  Administered 2021-04-06: 100 ug via INTRAVENOUS
  Administered 2021-04-06 (×2): 50 ug via INTRAVENOUS

## 2021-04-06 MED ORDER — SODIUM CHLORIDE 0.9 % IV SOLN
600.0000 mg | Freq: Once | INTRAVENOUS | Status: AC
  Administered 2021-04-07: 600 mg via INTRAVENOUS
  Filled 2021-04-06: qty 600

## 2021-04-06 MED ORDER — LACTATED RINGERS IV SOLN: 500.0000 mL | Freq: Once | INTRAVENOUS | Status: DC | PRN

## 2021-04-06 MED ORDER — VANCOMYCIN HCL 1000 MG IV SOLR
INTRAVENOUS | Status: DC | PRN
  Administered 2021-04-06: 1000 mg via TOPICAL

## 2021-04-06 MED ORDER — DOCUSATE SODIUM 100 MG PO CAPS
200.0000 mg | ORAL_CAPSULE | Freq: Every day | ORAL | Status: DC
  Administered 2021-04-07 – 2021-04-26 (×16): 200 mg via ORAL
  Filled 2021-04-06 (×21): qty 2

## 2021-04-06 MED ORDER — VANCOMYCIN HCL 1000 MG/200ML IV SOLN
1000.0000 mg | Freq: Two times a day (BID) | INTRAVENOUS | Status: AC
  Administered 2021-04-06 – 2021-04-08 (×4): 1000 mg via INTRAVENOUS
  Filled 2021-04-06 (×4): qty 200

## 2021-04-06 MED ORDER — VASOPRESSIN 20 UNIT/ML IV SOLN
INTRAVENOUS | Status: DC | PRN
  Administered 2021-04-06: 5 [IU] via INTRAVENOUS
  Administered 2021-04-06: 2 [IU] via INTRAVENOUS
  Administered 2021-04-06 (×2): 1 [IU] via INTRAVENOUS
  Administered 2021-04-06: 5 [IU] via INTRAVENOUS
  Administered 2021-04-06: 2 [IU] via INTRAVENOUS
  Administered 2021-04-06: 5 [IU] via INTRAVENOUS
  Administered 2021-04-06: 2 [IU] via INTRAVENOUS
  Administered 2021-04-06: 1 [IU] via INTRAVENOUS
  Administered 2021-04-06: 3 [IU] via INTRAVENOUS
  Administered 2021-04-06: 2 [IU] via INTRAVENOUS
  Administered 2021-04-06: 3 [IU] via INTRAVENOUS
  Administered 2021-04-06 (×6): 2 [IU] via INTRAVENOUS
  Administered 2021-04-06: 3 [IU] via INTRAVENOUS
  Administered 2021-04-06 (×2): 4 [IU] via INTRAVENOUS

## 2021-04-06 MED ORDER — MIDAZOLAM HCL 5 MG/5ML IJ SOLN
INTRAMUSCULAR | Status: DC | PRN
  Administered 2021-04-06: 2 mg via INTRAVENOUS
  Administered 2021-04-06: 4 mg via INTRAVENOUS
  Administered 2021-04-06 (×4): 1 mg via INTRAVENOUS

## 2021-04-06 MED ORDER — POTASSIUM CHLORIDE 10 MEQ/100ML IV SOLN
INTRAVENOUS | Status: DC | PRN
  Administered 2021-04-06 (×3): 10 meq via INTRAVENOUS

## 2021-04-06 MED ORDER — ACETAMINOPHEN 500 MG PO TABS
1000.0000 mg | ORAL_TABLET | Freq: Four times a day (QID) | ORAL | Status: AC
  Administered 2021-04-07 – 2021-04-11 (×17): 1000 mg via ORAL
  Filled 2021-04-06 (×19): qty 2

## 2021-04-06 MED ORDER — CHLORHEXIDINE GLUCONATE 0.12% ORAL RINSE (MEDLINE KIT)
15.0000 mL | Freq: Two times a day (BID) | OROMUCOSAL | Status: DC
  Administered 2021-04-06 – 2021-04-07 (×2): 15 mL via OROMUCOSAL

## 2021-04-06 MED ORDER — SODIUM CHLORIDE 0.9 % IV SOLN: INTRAVENOUS | Status: DC | PRN

## 2021-04-06 MED ORDER — PROPOFOL 10 MG/ML IV BOLUS
INTRAVENOUS | Status: AC
  Filled 2021-04-06: qty 20

## 2021-04-06 MED ORDER — HEMOSTATIC AGENTS (NO CHARGE) OPTIME
TOPICAL | Status: DC | PRN
  Administered 2021-04-06 (×2): 1 via TOPICAL

## 2021-04-06 MED ORDER — ACETAMINOPHEN 160 MG/5ML PO SOLN
1000.0000 mg | Freq: Four times a day (QID) | ORAL | Status: AC
  Administered 2021-04-06 – 2021-04-07 (×2): 1000 mg
  Filled 2021-04-06 (×3): qty 40.6

## 2021-04-06 MED ORDER — SODIUM CHLORIDE (PF) 0.9 % IJ SOLN
INTRAMUSCULAR | Status: DC | PRN
  Administered 2021-04-06: 1000 mL

## 2021-04-06 MED ORDER — AMIODARONE HCL IN DEXTROSE 360-4.14 MG/200ML-% IV SOLN
INTRAVENOUS | Status: DC | PRN
  Administered 2021-04-06: 30 mg/h via INTRAVENOUS

## 2021-04-06 MED ORDER — BISACODYL 5 MG PO TBEC
10.0000 mg | DELAYED_RELEASE_TABLET | Freq: Every day | ORAL | Status: DC
  Administered 2021-04-07 – 2021-04-17 (×10): 10 mg via ORAL
  Filled 2021-04-06 (×11): qty 2

## 2021-04-06 MED ORDER — CEFAZOLIN SODIUM-DEXTROSE 2-4 GM/100ML-% IV SOLN
2.0000 g | Freq: Three times a day (TID) | INTRAVENOUS | Status: AC
  Administered 2021-04-06 – 2021-04-08 (×6): 2 g via INTRAVENOUS
  Filled 2021-04-06 (×6): qty 100

## 2021-04-06 MED ORDER — FLUCONAZOLE IN SODIUM CHLORIDE 400-0.9 MG/200ML-% IV SOLN
400.0000 mg | Freq: Once | INTRAVENOUS | Status: AC
  Administered 2021-04-07: 400 mg via INTRAVENOUS
  Filled 2021-04-06: qty 200

## 2021-04-06 MED ORDER — HEPARIN SODIUM (PORCINE) 1000 UNIT/ML IJ SOLN
INTRAMUSCULAR | Status: DC | PRN
  Administered 2021-04-06: 25000 [IU] via INTRAVENOUS

## 2021-04-06 MED ORDER — HEPARIN 6000 UNIT IRRIGATION SOLUTION
Status: AC
  Filled 2021-04-06: qty 500

## 2021-04-06 MED ORDER — MAGNESIUM SULFATE 4 GM/100ML IV SOLN
4.0000 g | Freq: Once | INTRAVENOUS | Status: AC
  Administered 2021-04-06: 4 g via INTRAVENOUS
  Filled 2021-04-06: qty 100

## 2021-04-06 MED ORDER — ORAL CARE MOUTH RINSE
15.0000 mL | OROMUCOSAL | Status: DC
  Administered 2021-04-06 – 2021-04-07 (×5): 15 mL via OROMUCOSAL

## 2021-04-06 MED ORDER — POTASSIUM CHLORIDE 10 MEQ/50ML IV SOLN
10.0000 meq | INTRAVENOUS | Status: AC
  Administered 2021-04-06 (×3): 10 meq via INTRAVENOUS

## 2021-04-06 MED ORDER — PROPOFOL 10 MG/ML IV BOLUS
INTRAVENOUS | Status: DC | PRN
  Administered 2021-04-06: 20 mg via INTRAVENOUS

## 2021-04-06 MED ORDER — DEXMEDETOMIDINE HCL IN NACL 400 MCG/100ML IV SOLN
0.0000 ug/kg/h | INTRAVENOUS | Status: DC
  Administered 2021-04-06 – 2021-04-07 (×2): 0.7 ug/kg/h via INTRAVENOUS
  Filled 2021-04-06 (×2): qty 100

## 2021-04-06 MED ORDER — METHYLENE BLUE 0.5 % INJ SOLN
2.0000 mg/kg | Freq: Once | Status: AC
  Administered 2021-04-06: 155.6 mg via INTRAVENOUS
  Filled 2021-04-06: qty 31.1

## 2021-04-06 SURGICAL SUPPLY — 145 items
ADAPTER CARDIO PERF ANTE/RETRO (ADAPTER) IMPLANT
BAG COUNTER SPONGE SURGICOUNT (BAG) ×12 IMPLANT
BAG DECANTER FOR FLEXI CONT (MISCELLANEOUS) ×10 IMPLANT
BAG SURGICOUNT SPONGE COUNTING (BAG) ×3
BIOPATCH RED 1 DISK 7.0 (GAUZE/BANDAGES/DRESSINGS) ×4 IMPLANT
BIOPATCH RED 1IN DISK 7.0MM (GAUZE/BANDAGES/DRESSINGS) ×1
BLADE CLIPPER SURG (BLADE) ×5 IMPLANT
BLADE STERNUM SYSTEM 6 (BLADE) ×10 IMPLANT
BLADE SURG 10 STRL SS (BLADE) IMPLANT
BLADE SURG 12 STRL SS (BLADE) ×5 IMPLANT
BLADE SURG 15 STRL LF DISP TIS (BLADE) ×3 IMPLANT
BLADE SURG 15 STRL SS (BLADE) ×5
BLOOD HAEMOCONCENTR 700 MIDI (MISCELLANEOUS) ×5 IMPLANT
CANISTER SUCT 3000ML PPV (MISCELLANEOUS) ×10 IMPLANT
CANNULA AORTIC ROOT 9FR (CANNULA) ×5 IMPLANT
CANNULA ARTERIAL NVNT 3/8 20FR (MISCELLANEOUS) ×5 IMPLANT
CANNULA GUNDRY RCSP 15FR (MISCELLANEOUS) ×5 IMPLANT
CANNULA SUMP PERICARDIAL (CANNULA) ×5 IMPLANT
CANNULA VENOUS LOW PROF 34X46 (CANNULA) ×5 IMPLANT
CATH FOLEY 2WAY SLVR  5CC 14FR (CATHETERS) ×5
CATH FOLEY 2WAY SLVR 5CC 14FR (CATHETERS) ×3 IMPLANT
CATH HEART VENT LEFT (CATHETERS) ×3 IMPLANT
CATH ROBINSON RED A/P 18FR (CATHETERS) ×15 IMPLANT
CATH THORACIC 28FR (CATHETERS) ×5 IMPLANT
CATH THORACIC 36FR (CATHETERS) ×5 IMPLANT
CATH THORACIC 36FR RT ANG (CATHETERS) ×5 IMPLANT
CHLORAPREP W/TINT 26 (MISCELLANEOUS) ×5 IMPLANT
CNTNR URN SCR LID CUP LEK RST (MISCELLANEOUS) ×6 IMPLANT
CONN ST 1/4X3/8  BEN (MISCELLANEOUS) ×5
CONN ST 1/4X3/8 BEN (MISCELLANEOUS) ×3 IMPLANT
CONNECTOR Y 1/4X3/8X3/8 STRL (MISCELLANEOUS) ×5 IMPLANT
CONT SPEC 4OZ STRL OR WHT (MISCELLANEOUS) ×10
CONTAINER PROTECT SURGISLUSH (MISCELLANEOUS) ×10 IMPLANT
COVER PROBE W GEL 5X96 (DRAPES) ×5 IMPLANT
COVER SURGICAL LIGHT HANDLE (MISCELLANEOUS) ×5 IMPLANT
DEVICE SUT CK QUICK LOAD MINI (Prosthesis & Implant Heart) ×4 IMPLANT
DRAIN CHANNEL 28F RND 3/8 FF (WOUND CARE) IMPLANT
DRAIN CHANNEL 32F RND 10.7 FF (WOUND CARE) IMPLANT
DRAPE CARDIOVASCULAR INCISE (DRAPES) ×5
DRAPE INCISE IOBAN 66X45 STRL (DRAPES) ×10 IMPLANT
DRAPE PERI GROIN 82X75IN TIB (DRAPES) ×5 IMPLANT
DRAPE SRG 135X102X78XABS (DRAPES) ×3 IMPLANT
DRAPE WARM FLUID 44X44 (DRAPES) ×10 IMPLANT
DRSG COVADERM 4X14 (GAUZE/BANDAGES/DRESSINGS) ×5 IMPLANT
DRSG SORBAVIEW 3.5X5-5/16 MED (GAUZE/BANDAGES/DRESSINGS) ×5 IMPLANT
ELECT BLADE 4.0 EZ CLEAN MEGAD (MISCELLANEOUS) ×5
ELECT BLADE 6.5 EXT (BLADE) ×5 IMPLANT
ELECT CAUTERY BLADE 6.4 (BLADE) ×5 IMPLANT
ELECT REM PT RETURN 9FT ADLT (ELECTROSURGICAL) ×25
ELECTRODE BLDE 4.0 EZ CLN MEGD (MISCELLANEOUS) ×3 IMPLANT
ELECTRODE REM PT RTRN 9FT ADLT (ELECTROSURGICAL) ×15 IMPLANT
FELT TEFLON 1X6 (MISCELLANEOUS) ×15 IMPLANT
FELT TEFLON 6X6 (MISCELLANEOUS) ×5 IMPLANT
GAUZE 4X4 16PLY ~~LOC~~+RFID DBL (SPONGE) ×5 IMPLANT
GAUZE SPONGE 4X4 12PLY STRL (GAUZE/BANDAGES/DRESSINGS) ×20 IMPLANT
GAUZE SPONGE 4X4 12PLY STRL LF (GAUZE/BANDAGES/DRESSINGS) ×10 IMPLANT
GLOVE SURG ENC MOIS LTX SZ6 (GLOVE) IMPLANT
GLOVE SURG ENC MOIS LTX SZ6.5 (GLOVE) IMPLANT
GLOVE SURG ENC MOIS LTX SZ7 (GLOVE) IMPLANT
GLOVE SURG ENC MOIS LTX SZ7.5 (GLOVE) IMPLANT
GLOVE SURG ENC TEXT LTX SZ6.5 (GLOVE) ×5 IMPLANT
GLOVE SURG MICRO LTX SZ7 (GLOVE) ×10 IMPLANT
GOWN STRL REUS W/ TWL LRG LVL3 (GOWN DISPOSABLE) ×30 IMPLANT
GOWN STRL REUS W/ TWL XL LVL3 (GOWN DISPOSABLE) ×9 IMPLANT
GOWN STRL REUS W/TWL LRG LVL3 (GOWN DISPOSABLE) ×50
GOWN STRL REUS W/TWL XL LVL3 (GOWN DISPOSABLE) ×15
HEMOSTAT POWDER SURGIFOAM 1G (HEMOSTASIS) ×15 IMPLANT
HEMOSTAT SURGICEL 2X14 (HEMOSTASIS) ×5 IMPLANT
INSERT FOGARTY XLG (MISCELLANEOUS) IMPLANT
IV ADAPTER SYR DOUBLE MALE LL (MISCELLANEOUS) ×5 IMPLANT
KIT BASIN OR (CUSTOM PROCEDURE TRAY) ×10 IMPLANT
KIT CATH CPB BARTLE (MISCELLANEOUS) ×5 IMPLANT
KIT COMBO MINI 4X17COR-KNOT (Prosthesis & Implant Heart) ×1 IMPLANT
KIT LVAD HEARTMATE 3 W-CNTRL (Prosthesis & Implant Heart) ×3 IMPLANT
KIT LVAD HEARTMATE III W-CNTRL (Prosthesis & Implant Heart) ×2 IMPLANT
KIT MICROPUNCTURE NIT STIFF (SHEATH) ×5 IMPLANT
KIT SUCTION CATH 14FR (SUCTIONS) ×5 IMPLANT
KIT SUT CK MINI COMBO 4X17 (Prosthesis & Implant Heart) ×4 IMPLANT
KIT TURNOVER KIT B (KITS) ×10 IMPLANT
LEAD PACING MYOCARDI (MISCELLANEOUS) ×5 IMPLANT
LINE EXTENSION DELIVERY (MISCELLANEOUS) ×5 IMPLANT
LINE VENT (MISCELLANEOUS) ×5 IMPLANT
LOAD QUICK .035MM CK MINI (Prosthesis & Implant Heart) ×1 IMPLANT
NS IRRIG 1000ML POUR BTL (IV SOLUTION) ×55 IMPLANT
PACK E OPEN HEART (SUTURE) ×5 IMPLANT
PACK OPEN HEART (CUSTOM PROCEDURE TRAY) ×10 IMPLANT
PAD ARMBOARD 7.5X6 YLW CONV (MISCELLANEOUS) ×20 IMPLANT
PAD DEFIB R2 (MISCELLANEOUS) ×5 IMPLANT
PENCIL BUTTON HOLSTER BLD 10FT (ELECTRODE) ×5 IMPLANT
POSITIONER HEAD DONUT 9IN (MISCELLANEOUS) ×10 IMPLANT
PUNCH AORTIC ROTATE 4.5MM 8IN (MISCELLANEOUS) ×5 IMPLANT
SEALANT SURG COSEAL 8ML (VASCULAR PRODUCTS) ×5 IMPLANT
SET MPS 3-ND DEL (MISCELLANEOUS) ×5 IMPLANT
SHEATH AVANTI 11CM 5FR (SHEATH) IMPLANT
SOL PREP POV-IOD 4OZ 10% (MISCELLANEOUS) ×10 IMPLANT
SOL PREP PROV IODINE SCRUB 4OZ (MISCELLANEOUS) ×10 IMPLANT
SPONGE T-LAP 18X18 ~~LOC~~+RFID (SPONGE) ×35 IMPLANT
SPONGE T-LAP 4X18 ~~LOC~~+RFID (SPONGE) ×5 IMPLANT
STOPCOCK 4 WAY LG BORE MALE ST (IV SETS) ×5 IMPLANT
SUT BONE WAX W31G (SUTURE) ×5 IMPLANT
SUT EB EXC GRN/WHT 2-0 V-5 (SUTURE) ×10 IMPLANT
SUT ETHIBON EXCEL 2-0 V-5 (SUTURE) ×10 IMPLANT
SUT ETHIBOND 2 0 SH (SUTURE) ×25
SUT ETHIBOND 2 0 SH 36X2 (SUTURE) ×15 IMPLANT
SUT ETHIBOND 2 0 V5 (SUTURE) ×10 IMPLANT
SUT ETHIBOND NAB MH 2-0 36IN (SUTURE) ×65 IMPLANT
SUT ETHIBOND V-5 VALVE (SUTURE) ×5 IMPLANT
SUT ETHILON 3 0 FSL (SUTURE) ×5 IMPLANT
SUT PROLENE 3 0 SH DA (SUTURE) ×10 IMPLANT
SUT PROLENE 3 0 SH1 36 (SUTURE) ×5 IMPLANT
SUT PROLENE 4 0 RB 1 (SUTURE) ×40
SUT PROLENE 4-0 RB1 .5 CRCL 36 (SUTURE) ×24 IMPLANT
SUT PROLENE 5 0 C1 (SUTURE) IMPLANT
SUT SILK  1 MH (SUTURE) ×25
SUT SILK 1 MH (SUTURE) ×15 IMPLANT
SUT SILK 1 TIES 10X30 (SUTURE) ×5 IMPLANT
SUT SILK 2 0 SH CR/8 (SUTURE) ×10 IMPLANT
SUT SILK 3 0 SH CR/8 (SUTURE) ×5 IMPLANT
SUT STEEL 6MS V (SUTURE) ×10 IMPLANT
SUT STEEL SZ 6 DBL 3X14 BALL (SUTURE) ×5 IMPLANT
SUT TEM PAC WIRE 2 0 SH (SUTURE) IMPLANT
SUT VIC AB 1 CTX 36 (SUTURE) ×20
SUT VIC AB 1 CTX36XBRD ANBCTR (SUTURE) ×12 IMPLANT
SUT VIC AB 2-0 CT1 27 (SUTURE) ×5
SUT VIC AB 2-0 CT1 TAPERPNT 27 (SUTURE) ×3 IMPLANT
SUT VIC AB 2-0 CTX 27 (SUTURE) ×10 IMPLANT
SUT VIC AB 3-0 SH 8-18 (SUTURE) ×5 IMPLANT
SUT VIC AB 3-0 X1 27 (SUTURE) ×15 IMPLANT
SYR 50ML LL SCALE MARK (SYRINGE) ×5 IMPLANT
SYSTEM SAHARA CHEST DRAIN ATS (WOUND CARE) ×10 IMPLANT
TAPE CLOTH SURG 4X10 WHT LF (GAUZE/BANDAGES/DRESSINGS) ×10 IMPLANT
TAPE PAPER 2X10 WHT MICROPORE (GAUZE/BANDAGES/DRESSINGS) ×5 IMPLANT
TOWEL GREEN STERILE (TOWEL DISPOSABLE) ×10 IMPLANT
TOWEL GREEN STERILE FF (TOWEL DISPOSABLE) ×5 IMPLANT
TRAY CATH LUMEN 1 20CM STRL (SET/KITS/TRAYS/PACK) ×10 IMPLANT
TRAY FOLEY SLVR 16FR TEMP STAT (SET/KITS/TRAYS/PACK) ×5 IMPLANT
TUBE CONNECTING 12'X1/4 (SUCTIONS) ×1
TUBE CONNECTING 12X1/4 (SUCTIONS) ×4 IMPLANT
TUBE SUCT INTRACARD DLP 20F (MISCELLANEOUS) ×5 IMPLANT
TUBING ART PRESS 48 MALE/FEM (TUBING) ×10 IMPLANT
UNDERPAD 30X36 HEAVY ABSORB (UNDERPADS AND DIAPERS) ×5 IMPLANT
VALVE AORTIC SZ25 INSP/RESIL (Prosthesis & Implant Heart) ×5 IMPLANT
VENT LEFT HEART 12002 (CATHETERS) ×5
WATER STERILE IRR 1000ML POUR (IV SOLUTION) ×10 IMPLANT
YANKAUER SUCT BULB TIP NO VENT (SUCTIONS) ×5 IMPLANT

## 2021-04-06 NOTE — Anesthesia Procedure Notes (Signed)
Central Venous Catheter Insertion Performed by: Suzette Battiest, MD, anesthesiologist Start/End11/25/2022 7:05 AM, 04/06/2021 7:25 AM Patient location: Pre-op. Preanesthetic checklist: patient identified, IV checked, site marked, risks and benefits discussed, surgical consent, monitors and equipment checked, pre-op evaluation, timeout performed and anesthesia consent Position: Trendelenburg Lidocaine 1% used for infiltration and patient sedated Hand hygiene performed , maximum sterile barriers used  and Seldinger technique used Catheter size: 9 Fr Total catheter length 10. Central line and PA cath was placed.MAC introducer Swan type:thermodilution PA Cath depth:55 Procedure performed using ultrasound guided technique. Ultrasound Notes:anatomy identified, needle tip was noted to be adjacent to the nerve/plexus identified, no ultrasound evidence of intravascular and/or intraneural injection and image(s) printed for medical record Attempts: 1 Following insertion, line sutured, dressing applied and Biopatch. Post procedure assessment: blood return through all ports, free fluid flow and no air  Patient tolerated the procedure well with no immediate complications. Additional procedure comments: Did not attempt right sided IJ due to overlying barostim lead. Pt tolerated well. [name]

## 2021-04-06 NOTE — Progress Notes (Signed)
Patient ID: Brandon [name], male   DOB: 1955/12/30, 65 y.o.   MRN: [id number]  TCTS Evening Rounds:   Hemodynamically stable  CI = 2.6  Milrinone 0.125, epi 3, NE 31, vaso 0.04. NO 20  Has started to wake up  a little on vent.   Urine output ok  CT output 90/hr  CBC    Component Value Date/Time   WBC 23.9 (H) 04/06/2021 1551   RBC 3.45 (L) 04/06/2021 1551   HGB 8.8 (L) 04/06/2021 1551   HGB 16.4 02/21/2020 0918   HCT 27.8 (L) 04/06/2021 1551   HCT 50.9 02/21/2020 0918   PLT 110 (L) 04/06/2021 1551   PLT 195 02/21/2020 0918   MCV 80.6 04/06/2021 1551   MCV 89 02/21/2020 0918   MCH 25.5 (L) 04/06/2021 1551   MCHC 31.7 04/06/2021 1551   RDW 19.0 (H) 04/06/2021 1551   RDW 13.1 02/21/2020 0918   LYMPHSABS 0.6 (L) 03/11/2021 1038   LYMPHSABS 1.1 02/21/2020 0918   MONOABS 0.8 03/11/2021 1038   EOSABS 0.4 03/11/2021 1038   EOSABS 0.2 02/21/2020 0918   BASOSABS 0.0 03/11/2021 1038   BASOSABS 0.0 02/21/2020 0918     BMET    Component Value Date/Time   NA 136 04/06/2021 1551   NA 137 06/28/2020 0917   K 2.2 (LL) 04/06/2021 1551   CL 95 (L) 04/06/2021 1551   CO2 30 04/06/2021 1551   GLUCOSE 111 (H) 04/06/2021 1551   BUN 24 (H) 04/06/2021 1551   BUN 15 06/28/2020 0917   CREATININE 0.97 04/06/2021 1551   CALCIUM 7.7 (L) 04/06/2021 1551   GFRNONAA >60 04/06/2021 1551     A/P:  Stable postop course. Continue current plans

## 2021-04-06 NOTE — Progress Notes (Signed)
Pharmacy Antibiotic Note  Brandon [name] is a 65 y.o. male admitted on 03/07/2021 now post VAD 11/25.  Pharmacy has been consulted for empiric vancomycin dosing.  WBC 10.2, afebrile SCr 0.8 - at baseline  Plan: Give vancomycin 1000 mg IV every 12 hours  Vancomycin levels as indicated if continued past 48h Monitor renal function, CBC, cultures/sensitivities, LOT and de-escalate as able  Temp (24hrs), Avg:97.9 F (36.6 C), Min:97.7 F (36.5 C), Max:98.2 F (36.8 C)  Recent Labs  Lab 04/02/21 0430 04/03/21 0422 04/04/21 0419 04/04/21 0859 04/05/21 0500 04/05/21 1000 04/06/21 0337 04/06/21 0811 04/06/21 1015 04/06/21 1110 04/06/21 1208 04/06/21 1313 04/06/21 1352  WBC 10.0 9.8  --  12.0*  --  17.6* 10.2  --   --   --   --   --   --   CREATININE 0.75 0.77   < >  --    < >  --  0.76   < > 0.70 0.70 0.80 0.80 0.80   < > = values in this interval not displayed.    Estimated Creatinine Clearance: 98.3 mL/min (by C-G formula based on SCr of 0.8 mg/dL).    No Active Allergies  Antimicrobials this admission: Cefazolin, Fluconazole, Rifampin 11/25 x1 (pre-op abx) Augmentin 10/31 > 11/7 Vancomycin 10/26 > 10/28, 11/25 >> Cefepime 10/26 > 10/28  Microbiology results: 10/28 UCx: neg 10/26 BCx: NGF 11/21 [name] PCR:neg  Thank you for allowing pharmacy to be a part of this patient's care.  Laurey Arrow, PharmD PGY1 Pharmacy Resident 04/06/2021  3:40 PM  Please check [email or web address] for unit-specific pharmacy phone numbers.

## 2021-04-06 NOTE — Interval H&P Note (Signed)
History and Physical Interval Note:  04/06/2021 7:07 AM  Brandon [name]  has presented today for surgery, with the diagnosis of ICM.  The various methods of treatment have been discussed with the patient and family. After consideration of risks, benefits and other options for treatment, the patient has consented to  Procedure(s) with comments: INSERTION OF IMPLANTABLE LEFT VENTRICULAR ASSIST DEVICE (N/A) - HM3 AORTIC VALVE REPLACEMENT (AVR) (N/A) TRANSESOPHAGEAL ECHOCARDIOGRAM (TEE) (N/A) as a surgical intervention.  The patient's history has been reviewed, patient examined, no change in status, stable for surgery.  I have reviewed the patient's chart and labs.  Questions were answered to the patient's satisfaction.     Gaye Pollack

## 2021-04-06 NOTE — Anesthesia Preprocedure Evaluation (Signed)
Anesthesia Evaluation  Patient identified by MRN, date of birth, ID band Patient awake    Reviewed: Allergy & Precautions, NPO status , Patient's Chart, lab work & pertinent test results  Airway Mallampati: II  TM Distance: >3 FB Neck ROM: Full    Dental  (+) Dental Advisory Given   Pulmonary shortness of breath, COPD, former smoker,    breath sounds clear to auscultation       Cardiovascular hypertension, Pt. on medications + CAD, + Past MI and +CHF  + Cardiac Defibrillator  Rhythm:Regular Rate:Normal     Neuro/Psych negative neurological ROS     GI/Hepatic Neg liver ROS, hiatal hernia,   Endo/Other  negative endocrine ROS  Renal/GU negative Renal ROS     Musculoskeletal  (+) Arthritis ,   Abdominal   Peds  Hematology  (+) anemia ,   Anesthesia Other Findings   Reproductive/Obstetrics                             Lab Results  Component Value Date   WBC 10.2 04/06/2021   HGB 8.3 (L) 04/06/2021   HCT 26.5 (L) 04/06/2021   MCV 79.1 (L) 04/06/2021   PLT 301 04/06/2021   Lab Results  Component Value Date   CREATININE 0.76 04/06/2021   BUN 29 (H) 04/06/2021   NA 128 (L) 04/06/2021   K 2.4 (LL) 04/06/2021   CL 86 (L) 04/06/2021   CO2 31 04/06/2021   Lab Results  Component Value Date   INR 1.1 02/06/2021   INR 1.1 12/23/2019   INR 1.0 11/24/2019    Anesthesia Physical Anesthesia Plan  ASA: 4  Anesthesia Plan: General   Post-op Pain Management:    Induction: Intravenous  PONV Risk Score and Plan: 2 and Treatment may vary due to age or medical condition  Airway Management Planned: Oral ETT  Additional Equipment: Arterial line, CVP, PA Cath, Ultrasound Guidance Line Placement and TEE  Intra-op Plan:   Post-operative Plan: Post-operative intubation/ventilation  Informed Consent: I have reviewed the patients History and Physical, chart, labs and discussed the  procedure including the risks, benefits and alternatives for the proposed anesthesia with the patient or authorized representative who has indicated his/her understanding and acceptance.     Dental advisory given  Plan Discussed with: CRNA  Anesthesia Plan Comments:         Anesthesia Quick Evaluation

## 2021-04-06 NOTE — Anesthesia Procedure Notes (Signed)
Central Venous Catheter Insertion Performed by: Suzette Battiest, MD, anesthesiologist Start/End11/25/2022 7:05 AM, 04/06/2021 7:25 AM Patient location: Pre-op. Preanesthetic checklist: patient identified, IV checked, site marked, risks and benefits discussed, surgical consent, monitors and equipment checked, pre-op evaluation, timeout performed and anesthesia consent Hand hygiene performed  and maximum sterile barriers used  PA cath was placed.Swan type:thermodilution PA Cath depth:55 Procedure performed without using ultrasound guided technique. Attempts: 1 Patient tolerated the procedure well with no immediate complications.

## 2021-04-06 NOTE — Plan of Care (Signed)
  Problem: Clinical Measurements: Goal: Diagnostic test results will improve Outcome: Progressing Goal: Respiratory complications will improve Outcome: Progressing Goal: Cardiovascular complication will be avoided Outcome: Progressing   Problem: Elimination: Goal: Will not experience complications related to urinary retention Outcome: Progressing   Problem: Pain Managment: Goal: General experience of comfort will improve Outcome: Progressing   Problem: Fluid Volume: Goal: Risk for excess fluid volume will decrease Outcome: Progressing   Problem: Clinical Measurements: Goal: Ability to maintain clinical measurements within normal limits will improve Outcome: Progressing Goal: Will remain free from infection Outcome: Progressing   Problem: Respiratory: Goal: Will regain and/or maintain adequate ventilation Outcome: Progressing

## 2021-04-06 NOTE — Plan of Care (Signed)
  Problem: Education: Goal: Knowledge of General Education information will improve Description: Including pain rating scale, medication(s)/side effects and non-pharmacologic comfort measures Outcome: Progressing   Problem: Health Behavior/Discharge Planning: Goal: Ability to manage health-related needs will improve Outcome: Progressing   Problem: Clinical Measurements: Goal: Ability to maintain clinical measurements within normal limits will improve Outcome: Progressing Goal: Will remain free from infection Outcome: Progressing Goal: Diagnostic test results will improve Outcome: Progressing Goal: Respiratory complications will improve Outcome: Progressing Goal: Cardiovascular complication will be avoided Outcome: Progressing   Problem: Activity: Goal: Risk for activity intolerance will decrease Outcome: Progressing   Problem: Nutrition: Goal: Adequate nutrition will be maintained Outcome: Progressing   Problem: Coping: Goal: Level of anxiety will decrease Outcome: Progressing   Problem: Elimination: Goal: Will not experience complications related to bowel motility Outcome: Progressing Goal: Will not experience complications related to urinary retention Outcome: Progressing   Problem: Pain Managment: Goal: General experience of comfort will improve Outcome: Progressing   Problem: Safety: Goal: Ability to remain free from injury will improve Outcome: Progressing   Problem: Skin Integrity: Goal: Risk for impaired skin integrity will decrease Outcome: Progressing   Problem: Education: Goal: Ability to demonstrate management of disease process will improve Outcome: Progressing Goal: Ability to verbalize understanding of medication therapies will improve Outcome: Progressing Goal: Individualized Educational Video(s) Outcome: Progressing   Problem: Activity: Goal: Capacity to carry out activities will improve Outcome: Progressing   Problem: Cardiac: Goal:  Ability to achieve and maintain adequate cardiopulmonary perfusion will improve Outcome: Progressing   Problem: Education: Goal: Knowledge of the prescribed therapeutic regimen will improve Outcome: Progressing   Problem: Activity: Goal: Risk for activity intolerance will decrease Outcome: Progressing   Problem: Cardiac: Goal: Ability to maintain an adequate cardiac output will improve Outcome: Progressing   Problem: Coping: Goal: Level of anxiety will decrease Outcome: Progressing   Problem: Fluid Volume: Goal: Risk for excess fluid volume will decrease Outcome: Progressing   Problem: Clinical Measurements: Goal: Ability to maintain clinical measurements within normal limits will improve Outcome: Progressing Goal: Will remain free from infection Outcome: Progressing   Problem: Respiratory: Goal: Will regain and/or maintain adequate ventilation Outcome: Progressing

## 2021-04-06 NOTE — Brief Op Note (Signed)
03/07/2021 - 04/06/2021  10:38 AM  PATIENT:  Brandon [name]  65 y.o. male  PRE-OPERATIVE DIAGNOSIS:  Ischemic cardiomyopathy  POST-OPERATIVE DIAGNOSIS:  Ischemic cardiomyopathy  PROCEDURE:  Procedure(s) with comments: INSERTION OF IMPLANTABLE LEFT VENTRICULAR ASSIST DEVICE (N/A) - HM3 AORTIC VALVE REPLACEMENT (AVR) WITH INSPIRIS RESILIA  AORTIC VALVE SIZE 25MM (N/A) TRANSESOPHAGEAL ECHOCARDIOGRAM (TEE) (N/A) APPLICATION OF CELL SAVER  SURGEON:  Surgeon(s) and Role:    * Bartle, Fernande Boyden, MD - Primary  PHYSICIAN ASSISTANT: Takeya Marquis PA-C  ASSISTANTS: STAFF   ANESTHESIA:   general  EBL:  2000 mL   BLOOD ADMINISTERED:SEE PERFUSION /ANESTHESIA RECORDS  DRAINS:  MEDIASTINAL AND PLEURAL CHEST TUBES    LOCAL MEDICATIONS USED:  NONE  SPECIMEN:  Source of Specimen:  VENTRICULAR CORE/AORTIC VALVE LEAFLETS  DISPOSITION OF SPECIMEN:  PATHOLOGY  COUNTS:  YES  TOURNIQUET:  * No tourniquets in log *  DICTATION: .Dragon Dictation  PLAN OF CARE: Admit to inpatient   PATIENT DISPOSITION:  ICU - intubated and hemodynamically stable.   Delay start of Pharmacological VTE agent (>24hrs) due to surgical blood loss or risk of bleeding: yes  COMPLICATIONS: NO KNOWN

## 2021-04-06 NOTE — Progress Notes (Signed)
  Echocardiogram Echocardiogram Transesophageal has been performed.  Brandon [name] 04/06/2021, 9:13 AM

## 2021-04-06 NOTE — Transfer of Care (Signed)
Immediate Anesthesia Transfer of Care Note  Patient: Brandon [name]  Procedure(s) Performed: INSERTION OF IMPLANTABLE LEFT VENTRICULAR ASSIST DEVICE (Chest) AORTIC VALVE REPLACEMENT (AVR) WITH INSPIRIS RESILIA  AORTIC VALVE SIZE 25MM (Chest) TRANSESOPHAGEAL ECHOCARDIOGRAM (TEE) APPLICATION OF CELL SAVER (Chest)  Patient Location: ICU  Anesthesia Type:General  Level of Consciousness: sedated and Patient remains intubated per anesthesia plan  Airway & Oxygen Therapy: Patient remains intubated per anesthesia plan and Patient placed on Ventilator (see vital sign flow sheet for setting)  Post-op Assessment: Report given to RN and Post -op Vital signs reviewed and stable  Post vital signs: Reviewed and stable  Last Vitals:  Vitals Value Taken Time  BP 127/83 04/06/21 1522  Temp 37.9 C 04/06/21 1524  Pulse 59 04/06/21 1524  Resp 0 04/06/21 1524  SpO2 99 % 04/06/21 1524  Vitals shown include unvalidated device data.  Last Pain:  Vitals:   04/06/21 0353  TempSrc: Oral  PainSc:       Patients Stated Pain Goal: 1 (77/41/28 7867)  Complications: No notable events documented.

## 2021-04-06 NOTE — Final Progress Note (Signed)
EKG CRITICAL VALUE     12 lead EKG performed.  Critical value noted.  Terie Purser, RN notified.   Ouida Sills, CCT 04/06/2021 4:45 PM

## 2021-04-06 NOTE — Anesthesia Procedure Notes (Signed)
Arterial Line Insertion Start/End11/25/2022 7:00 AM, 04/06/2021 7:12 AM Performed by: CRNA  Patient location: Pre-op. Preanesthetic checklist: patient identified, IV checked, site marked, risks and benefits discussed, surgical consent, monitors and equipment checked, pre-op evaluation, timeout performed and anesthesia consent Lidocaine 1% used for infiltration and patient sedated Left, radial was placed Catheter size: 20 G Hand hygiene performed , maximum sterile barriers used  and Seldinger technique used Allen's test indicative of satisfactory collateral circulation Attempts: 2 Procedure performed without using ultrasound guided technique. Following insertion, dressing applied and Biopatch. Post procedure assessment: normal and unchanged  Patient tolerated the procedure well with no immediate complications.

## 2021-04-06 NOTE — Plan of Care (Signed)

## 2021-04-06 NOTE — Op Note (Addendum)
CARDIOVASCULAR SURGERY OPERATIVE NOTE  Brandon [name] [id number] 04/06/2021   Surgeon:  Gaye Pollack, MD  First Assistant: Jadene Pierini, MD  : An experienced assistant was required given the complexity of this surgery and the standard of surgical care. The assistant was needed for exposure, dissection, suctioning, retraction of delicate tissues and sutures, instrument exchange and for overall help during this procedure.   Preoperative Diagnosis: Class IV heart failure with LVEF 20-25%   Postoperative Diagnosis:  Same   Procedure  Median Sternotomy Extracorporeal circulation 3.   Implantation of HeartMate 3 Left Ventricular Assist Device. 4.   Aortic valve replacement using a 25 mm Edwards INSPIRIS RESILIA pericardial valve. 5.   Insertion of right common femoral arterial line for BP monitoring.  Anesthesia:  General Endotracheal   Clinical History/Surgical Indication:  This 65 year old gentleman has chronic systolic heart failure due to ischemic cardiomyopathy with an ejection fraction of 20% on echocardiogram.  He has been  NYHA class III on GDMT. Medical treatment has been limited due to relatively low blood pressure.  He also has a severely calcified aortic valve with restricted leaflet mobility with a mean gradient of only 18 mmHg but probably has severe low-flow/low gradient aortic stenosis.  There is moderate regurgitation.  There is a small mass on one of his leaflets suggesting possible fibroblastoma although a vegetation cannot be ruled out.  He was admitted on 03/07/2021 with recurrent sepsis with hypotension. He was seen by ID and follow up cultures were negative. He was felt to require advanced therapy for his heart failure.  He was not felt to be a candidate for heart transplant at [facility].  I think he would be a candidate for LVAD insertion as destination therapy.  He has no other significant organ dysfunction, is compliant and very motivated.  He quit smoking over a year  ago.  It may be advisable to replace his aortic valve if he does have an LVAD inserted to decrease the risk of embolic disease from his calcified valve and possible fibroblastoma as well as the adverse effects of moderate AI with an LVAD.  I discussed LVAD implantation surgery, expectations, and risks with him and his sisters and answered all their questions.  The operative procedure has been discussed with the patient and his sisters including alternatives, benefits and risks; including but not limited to bleeding, blood transfusion, infection, stroke, myocardial infarction, pump failure or thrombus possibly requiring replacement, RV dysfunction requiring an RVAD or long term milrinone therapy, heart block requiring a permanent pacemaker, organ dysfunction, and death.  The patient understands and agrees to proceed.      Preparation:  The patient was seen in the preoperative holding area and the correct patient, correct operation were confirmed with the patient after reviewing the medical record and catheterization. The consent was signed by me. Preoperative antibiotics were given. A pulmonary arterial line and radial arterial line were placed by the anesthesia team. The patient was taken back to the operating room and positioned supine on the operating room table. After being placed under general endotracheal anesthesia by the anesthesia team a foley catheter was placed. The neck, chest, abdomen, and both legs were prepped with betadine soap and solution and draped in the usual sterile manner. A surgical time-out was taken and the correct patient and operative procedure were confirmed with the nursing and anesthesia staff.   Pre-bypass TEE:   Complete TEE assessment was performed by Suzette Battiest and reviewed by me. This showed probably  moderate eccentric AI with a calcified aortic valve with restricted mobility. There was moderate AS, mild MR. No PFO with negative bubble study. RV function looked  normal.  Median sternotomy:    A median sternotomy was performed. The pericardium was opened in the midline. There was recent hemorrhagic pericarditis with a thick layer of organized clot circumferentially around the heart. This was removed to expose the aorta and right atrium. The ascending aorta was of normal size and had no palpable plaque. There were no contraindications to aortic cannulation or cross-clamping. The pericardium was opened along the left diaphragm out to the apex.  Then a 4 cm transverse incision was made to the left of the umbilicus and continued down to the rectus fascia using electrocautery. A skin punch was used to create the drive line exit site about 3 cm below the right costal margin in the anterior axillary line. The tunneling device was passed in a subcutaneous plane from the transverse abdominal incision to the pericardium  by exiting through the rectus fascia into the pre-peritoneal space near the pericardium. Care was taken to make sure that the peritoneum was separated from the posterior rectus fascia at this location and the entrance site of the trocar into the preperitoneal space was observed while passing the trocar to prevent intraabdominal injury. The other tunneling device was passed from the abdominal incision the the skin exit site.  Cardiopulmonary Bypass:   The patient was fully systemically heparinized and the ACT was maintained > 400 sec. The proximal aortic arch was cannulated with a 20 F aortic cannula for arterial inflow. Venous cannulation was performed via the right atrial appendage using a two-staged venous cannula. Carbon dioxide was insufflated into the pericardium at 6L/min throughout the procedure to minimize intracardiac air. The systemic temperature was allowed to drift downward slightly during the procedure and the patient was actively rewarmed. The patient was placed on cardiopulmonary bypass. A left ventricular vent was placed via the right  superior pulmonary vein. An antegrade cardioplegia/vent cannula was placed in the ascending aorta. The aorta was clamped and 1200 cc of cold KBC antegrade cardioplegia was given with complete arrest of the heart. Systemic hypothermia to 32 degree C was used.  Aortic Valve Replacement:   A transverse aortotomy was performed 1 cm above the take-off of the right coronary artery. The native valve was functionally bicuspid with fusion of the right and left cusps. There was partial disruption of this conjoined commissure with prolapse of the left cusp with calcified leaflets and mild annular calcification. I did not see a papillary fibroelastoma. The ostia of the coronary arteries were in normal position and were not obstructed. The native valve leaflets were excised and the annulus was decalcified with rongeurs. Care was taken to remove all particulate debris. The left ventricle was directly inspected for debris and then irrigated with ice saline solution. The annulus was sized and a size 25 mm Edwards INSPIRIS RESILIA pericardial valve was chosen. The model number was [id number] and the serial number was [id number].  While the valve was being prepared 2-0 Ethibond pledgeted horizontal mattress sutures were placed around the annulus with the pledgets in a sub-annular position. The sutures were placed through the sewing ring and the valve lowered into place. The sutures were tied sequentially using Corknots. The valve seated nicely and the coronary ostia were not obstructed. The prosthetic valve leaflets moved normally and there was no sub-valvular obstruction. The aortotomy was closed using 4-0 Prolene suture in 2 layers  with felt strips to reinforce the closure. Then a reanimation dose of warm antegrade KBC cardioplegia was given. After de-airing maneuvers the bed was placed in trendelenburg position and the cross clamp was removed with return of sinus rhythm. The patient was rewarmed to 37 degrees C. Cross clamp time was  66 minutes.  Insertion of apical outflow cannula:   Laparotomy pads were placed behind the heart to aid exposure of the apex. A site was chosen on the anterior wall lateral to the LAD and superior to the true apex. A stab incision was made and a foley catheter placed through the coring knife and into the left ventricle. The balloon was inflated and with gentle traction on the catheter the coring knife was carefully advanced toward the mitral valve to create the ventriculotomy. The balloon was deflated and removed. A sump was placed into the LV and the ventricular core excised and sent to pathology. Trabeculations that might cause obstruction were excised. Then a series of 2-0 Ethibond horizontal mattress sutures were placed around the ventriculotomy. This took 13 sutures. The sutures were placed through the apical cuff. The sutures were tied and the apical cuff appeared to be well-sealed to the apex. Then volume was left in the heart and the lungs inflated. The HeartMate 3 was brought onto the operative field. The inflow cannula was inserted into the apical cuff and the apical cuff lock engaged.   The drive line was then tunneled using the previously placed tunneling devices. The bend relief was fully engaged and tested with an axial pull. The pump was turned into the appropriate position. The heart and pump were returned to the pericardium and appeared to sit nicely.   Aortic outflow graft anastomosis:  The outflow graft was distended and positioned along the inferior margin of the heart and around the right atrium. The outflow graft was cut to the appropriate length. The ascending aorta was partially occluded with a Lemole clamp. A slightly diagonal midline aortotomy was created and the ends enlarged with a 4.5 mm punch. The outflow graft was anastomosed to the aorta using 4-0 prolene pledgetted sutures at the toe and heal that were run down each side. Coseal was used to seal the needle holes in the graft.  The Lemole clamp was removed and the graft clamped with a peripheral Debakey clamp and deaired using a 21 gauge needle. The aortic anastomosis was hemostatic.   Insertion of right common femoral arterial line:  The radial BP was running low the entire case requiring high doses of NE, Vasopressin and finally a dose of methylene blue. Therefore I inserted a right femoral arterial line using Sonosite guidance and micropuncture technique.    Weaning from bypass:  The patient was started on Milrinone 0.125 mcg/kg/min, epinephrine 4 mcg/kg/min, levophed at 10 mcg/kg/min and nitric oxide at 20 ppm. The HeartMate 3 pump was started at 3000 rpm. TEE confirmed that the intracardiac air was removed.  Cardiopulmonary bypass flow was decreased to half flow and the clamp removed from the outflow graft. The speed was gradually increased to 4000 rpm. Flow was decreased to 1/4 and the speed gradually increased to 4800 rpm. Cardiopulmonary bypass was discontinued and the speed increased to 5400 rpm.  Pulmonary artery pressures were normal with a low CVP of 5.  CO was 5 L/min. CPB time was 187 minutes. TEE showed excellent apical cannula position. There was mild MR, trivial TR. RV function appeared normal.   Completion:  A bipolar epicardial pacing wire  was placed on the inferior wall.  Heparin was fully reversed with protamine and the aortic and venous cannulas removed. Hemostasis was achieved. Mediastinal drainage tubes and bilateral pleural chest tubes were placed.  A 32 F Bard pump pocket drain was placed. The sternum was closed with  #6 stainless steel wires. The fascia was closed with continuous # 1 vicryl suture. The subcutaneous tissue was closed with 2-0 vicryl continuous suture. The skin was closed with 3-0 vicryl subcuticular suture. The abdominal incision was closed with continuous 3-0 vicryl subcutaneous suture and 3-0 vicryl subcuticular skin suture. The drive line exit site was re-approximated  3-0 nylon  skin sutures. The two drive line fasteners were applied. All sponge, needle, and instrument counts were reported correct at the end of the case. Dry sterile dressings were placed over the incisions and around the chest tubes which were connected to pleurevac suction. The patient was then transported to the surgical intensive care unit in critical but stable condition.

## 2021-04-06 NOTE — Anesthesia Postprocedure Evaluation (Signed)
Anesthesia Post Note  Patient: Brandon [name]  Procedure(s) Performed: INSERTION OF IMPLANTABLE LEFT VENTRICULAR ASSIST DEVICE (Chest) AORTIC VALVE REPLACEMENT (AVR) WITH INSPIRIS RESILIA  AORTIC VALVE SIZE 25MM (Chest) TRANSESOPHAGEAL ECHOCARDIOGRAM (TEE) APPLICATION OF CELL SAVER (Chest)     Patient location during evaluation: SICU Anesthesia Type: General Level of consciousness: sedated Pain management: pain level controlled Vital Signs Assessment: post-procedure vital signs reviewed and stable Respiratory status: patient remains intubated per anesthesia plan Cardiovascular status: stable Postop Assessment: no apparent nausea or vomiting Anesthetic complications: no   No notable events documented.  Last Vitals:  Vitals:   04/06/21 1700 04/06/21 1710  BP: (!) 81/69   Pulse: (!) 28   Resp: (!) 22 (!) 22  Temp: 37.5 C 37.6 C  SpO2: 100%     Last Pain:  Vitals:   04/06/21 0353  TempSrc: Oral  PainSc:                  Tiajuana Amass

## 2021-04-06 NOTE — Progress Notes (Signed)
RN called to patient's bedside to assess LVAD driveline dressing from OR. Driveline dressing not in tact upon assessment and Driveline site exposed.   Drive line dressing change:   LVAD driveline dressing was changed using proper sterile technique. Old dressing removed. Driveline site cleaned using chloroprep x2. Silver strip applied. New gauze dressing applied using sterile technique. Driveline secured at driveline site with suture. Driveline site clean, no active drainage from site, no redness, no swelling, no tenderness, no odor from drainage. Driveline anchor intact and applied correctly. Driveline anchor changed due to positioning. New driveline anchor intact.   NEXT DRESSING CHANGE DUE 04/07/2021.   Alma Friendly RN

## 2021-04-06 NOTE — Progress Notes (Signed)
Patient ID: Brandon [name], male   DOB: Oct 06, 1955, 65 y.o.   MRN: [id number]     Advanced Heart Failure Rounding Note  PCP-Cardiologist: [name], MD   Subjective:    Admitted with sepsis. Started on cefepime + vanc, now off abx (ID following). Afebrile.  10/29 Co-ox 48%, started on milrinone 0.25.  10/30 IV Antibiotics stopped. Blood cultures no growth for 5 days.  10/31 started on Augmentin for PNA.  11/1 Milrinone off 11/2 Milrinone added back. Midodrine increased to 10 mg TID 11/10 Developed Afib w/ RVR, converted with IV amio 11/14 Milrinone increased to 0.25 mcg. Diuresed with IV lasix  11/25 Underwent HM-3 VAD placement and AVR  Just back from OR after HM-3 VAD placement and bioprosthetic AVR. Had persistent hypotension in OR requiring methylene blue and high dose pressors.   Remains intubated sedated. MAPs now look good.   On NO 20, milrinone 0.125, VP 0.04, NE 30 epi 3   CTs oozy   Swan  PA 31/18 Thermo 5.0/2.5  LVAD Interrogation HM 3: Speed: 5500 Flow: 4.7 PI: 2.8 Power: 4.0. VAD interrogated personally. Parameters stable.   Objective:   Weight Range: 77.8 kg Body mass index is 23.26 kg/m.   Vital Signs:   Temp:  [97.7 F (36.5 C)-100.2 F (37.9 C)] 99.7 F (37.6 C) (11/25 1710) Pulse Rate:  [28-95] 28 (11/25 1700) Resp:  [0-26] 22 (11/25 1710) BP: (81-127)/(55-83) 81/69 (11/25 1700) SpO2:  [93 %-100 %] 100 % (11/25 1700) Arterial Line BP: (69-89)/(45-61) 83/61 (11/25 1710) FiO2 (%):  [50 %] 50 % (11/25 1539) Weight:  [77.8 kg] 77.8 kg (11/25 0500) Last BM Date: 04/05/21  Weight change: Filed Weights   04/04/21 0449 04/05/21 0419 04/06/21 0500  Weight: 78.7 kg 79.6 kg 77.8 kg    Intake/Output:   Intake/Output Summary (Last 24 hours) at 04/06/2021 1759 Last data filed at 04/06/2021 1700 Gross per 24 hour  Intake 9234.37 ml  Output 4055 ml  Net 5179.37 ml      Physical Exam   General:  Intubated/sedated HEENT: normal + ETT Neck:  supple. LIJ swan Carotids 2+ bilat; no bruits. No lymphadenopathy or thryomegaly appreciated. Cor: LVAD hum. Sternal dressing ok. CT oozy Lungs: Coarse Abdomen: soft, nontender, non-distended. No hepatosplenomegaly. No bruits or masses. Hypoactive bowel sounds. Driveline site with dressing Extremities: no cyanosis, clubbing, rash. Warm tr edema  Neuro: intubated/sedated   Telemetry   Sinus 95-105 Personally reviewed   Labs    CBC Recent Labs    04/06/21 0337 04/06/21 0808 04/06/21 1100 04/06/21 1110 04/06/21 1352 04/06/21 1551  WBC 10.2  --   --   --   --  23.9*  HGB 8.3*   < > 7.4*   < > 10.2* 8.8*  HCT 26.5*   < > 24.0*   < > 30.0* 27.8*  MCV 79.1*  --   --   --   --  80.6  PLT 301  --  306  --   --  110*   < > = values in this interval not displayed.    Basic Metabolic Panel Recent Labs    04/06/21 0337 04/06/21 0808 04/06/21 1352 04/06/21 1551  NA 128*   < > 138 136  K 2.4*   < > 2.0* 2.2*  CL 86*   < > 91* 95*  CO2 31  --   --  30  GLUCOSE 183*   < > 174* 111*  BUN 29*   < >  24* 24*  CREATININE 0.76   < > 0.80 0.97  CALCIUM 8.7*  --   --  7.7*  MG 2.0  --   --  2.0   < > = values in this interval not displayed.    Liver Function Tests Recent Labs    04/04/21 0502  AST 21  ALT 26  ALKPHOS 96  BILITOT 0.5  PROT 7.2  ALBUMIN 2.7*     No results for input(s): LIPASE, AMYLASE in the last 72 hours. Cardiac Enzymes No results for input(s): CKTOTAL, CKMB, CKMBINDEX, TROPONINI in the last 72 hours.  BNP: BNP (last 3 results) Recent Labs    11/30/20 0144 01/08/21 0731 01/18/21 1629  BNP 2,653.1* 734.2* 607.9*     ProBNP (last 3 results) No results for input(s): PROBNP in the last 8760 hours.    D-Dimer No results for input(s): DDIMER in the last 72 hours. Hemoglobin A1C No results for input(s): HGBA1C in the last 72 hours. Fasting Lipid Panel No results for input(s): CHOL, HDL, LDLCALC, TRIG, CHOLHDL, LDLDIRECT in the last 72  hours. Thyroid Function Tests No results for input(s): TSH, T4TOTAL, T3FREE, THYROIDAB in the last 72 hours.  Invalid input(s): FREET3  Other results:   Imaging    DG Chest Port 1 View  Result Date: 04/06/2021 CLINICAL DATA:  Post left ventricular assist device placement. EXAM: PORTABLE CHEST 1 VIEW COMPARISON:  Chest x-ray 03/21/2021. FINDINGS: Endotracheal tube tip is 4.6 cm above the carina. There is a new left ventricular cyst device in place. There are new sternotomy wires and prosthetic heart valve. Mediastinal drain and bilateral pleural drains are present. Swan-Ganz catheter is present with distal tip projecting over the main pulmonary artery. Right-sided generator is unchanged in position. The cardiomediastinal silhouette appears within normal limits. There are diffuse interstitial opacities, right greater than left, similar to the prior exam. There is no pleural effusion. There is likely a tiny right apical pneumothorax measuring 9 mm from the lung apex. IMPRESSION: 1. New postsurgical changes as above. 2. Small right apical pneumothorax. 3. Stable bilateral interstitial prominence. Electronically Signed   By: Ronney Asters M.D.   On: 04/06/2021 16:09     Medications:     Scheduled Medications:  [START ON 04/07/2021] acetaminophen  1,000 mg Oral Q6H   Or   [START ON 04/07/2021] acetaminophen (TYLENOL) oral liquid 160 mg/5 mL  1,000 mg Per Tube Q6H   [START ON 04/07/2021] aspirin EC  325 mg Oral Daily   Or   [START ON 04/07/2021] aspirin  324 mg Per Tube Daily   Or   [START ON 04/07/2021] aspirin  300 mg Rectal Daily   [START ON 04/07/2021] bisacodyl  10 mg Oral Daily   Or   [START ON 04/07/2021] bisacodyl  10 mg Rectal Daily   chlorhexidine gluconate (MEDLINE KIT)  15 mL Mouth Rinse BID   Chlorhexidine Gluconate Cloth  6 each Topical Daily   digoxin  0.125 mg Oral Daily   [START ON 04/07/2021] docusate sodium  200 mg Oral Daily   ezetimibe  10 mg Oral Daily   folic  acid  1 mg Oral Daily   gabapentin  200 mg Oral TID   mouth rinse  15 mL Mouth Rinse 10 times per day   metoCLOPramide (REGLAN) injection  10 mg Intravenous Q6H   midodrine  10 mg Oral TID WC   multivitamin with minerals  1 tablet Oral Daily   [START ON 04/08/2021] pantoprazole  40 mg  Oral Daily   sertraline  100 mg Oral Daily   sodium chloride flush  10-40 mL Intracatheter Q12H   [START ON 04/07/2021] sodium chloride flush  3 mL Intravenous Q12H   tamsulosin  0.4 mg Oral Daily   vitamin B-12  100 mcg Oral Daily    Infusions:  sodium chloride Stopped (04/06/21 1633)   [START ON 04/07/2021] sodium chloride     sodium chloride 20 mL/hr at 04/06/21 1510   albumin human 12.5 g (04/06/21 1730)   amiodarone 30 mg/hr (04/06/21 1700)    ceFAZolin (ANCEF) IV     dexmedetomidine (PRECEDEX) IV infusion 0.7 mcg/kg/hr (04/06/21 1700)   epinephrine 3 mcg/min (04/06/21 1700)   famotidine (PEPCID) IV     [START ON 04/07/2021] fluconazole (DIFLUCAN) IV     insulin 1 Units/hr (04/06/21 1700)   lactated ringers     lactated ringers     lactated ringers 20 mL/hr at 04/06/21 1700   magnesium sulfate 20 mL/hr at 04/06/21 1700   milrinone 0.125 mcg/kg/min (04/06/21 1700)   norepinephrine (LEVOPHED) Adult infusion 35 mcg/min (04/06/21 1700)   potassium chloride 10 mEq (04/06/21 1741)   [START ON 04/07/2021] rifampin (RIFADIN) IVPB     vancomycin     vasopressin 0.04 Units/min (04/06/21 1700)     PRN Medications: sodium chloride, albumin human, dextrose, lactated ringers, midazolam, morphine injection, ondansetron (ZOFRAN) IV, oxyCODONE, sodium chloride flush, [START ON 04/07/2021] sodium chloride flush, traMADol    Patient Profile   Brandon [name] is a 65 year old with history of smoker, CAD s/p previous MI, HTN, HL, chronic HFeEF, sepsis 11/2020 bacteremia.  Admitted with sepsis. Bld Cx - NGTD    Assessment/Plan    1. Acute on chronic systolic HFr EF -> cardiogenic shock - Echo 05/19/20 EF  20-25% RV mildly HK. moderate AS  Mean gradient 13 AVA 1.2 cm2 DI 0.30 - RHC (7/22): EF 20%, low filling pressures. CI 2.3.  - RHC 11/16  RA 2, PA 65/27 (44), PCW 28, SVR 960, CO 6.5,  CI 3.2 , PA 57%. Milrinone was increased to 0.375 mcg.  - Persistent NYHA IV. Admitted with shock - s/p HM-III VAD + AVR on 11/25 - On multiple pressors immediately post-op due to severe vasoplegia. Wean slowly. Keep MAPs > 70 - RV looked good on TEE in OR  2. HM-3 LVAD implant - POD #0 - Plan as above - transfuse to keep hgb > 8.0 - VAD interrogated personally. Parameters stable.  3. Acute Hypoxic Respiratory Failure  - Remains intubated post-op  - wean vent as tolerated - hopefully extubate in am  4. 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 CAD  s/p anterior STEMI (12/20). LHC showed chronically occluded RCA (with L>>R collaterals) and thrombotic occlusion of proximal LAD. Underwent PCI of LAD. - Continue atorvastatin 80.   6. Iron-deficiency anemia & ABLA - Rec'd Feraheme 11/8 and 11/13  - watch CTs - Keep hgb > 8.0  7. Hypokalemia - supp  8. Severe protein-calorie malnutrition - prealbumin 11>15> 13 despite adequate caloric intake on calorie count - Continue aggressive nutritional support  9. Paroxysmal Atrial Fibrillation w/ RVR - new, developed 11/10. Now back in NSR   - Continue IV amio  10. Hyponatremia - Na 136 currently  11. Sepsis - H/o strep bacteremia 11/2020.  ICD extracted 12/04/20. Barostim was not removed as it is extravascular.  -  Received IV antibiotics until 01/15/21. Blood cultures negative 01/30/21..  - 10/26 -Blood CX x2  Negative  - Ct chest concerning for pneumonia. Treated w/ cefepime/vancomycin -> Augmentin (stop 11/7) - Resolved  CRITICAL CARE Performed by: Glori Bickers  Total critical care time: 45 minutes  Critical care time was exclusive of  separately billable procedures and treating other patients.  Critical care was necessary to treat or prevent imminent or life-threatening deterioration.  Critical care was time spent personally by me (independent of midlevel providers or residents) on the following activities: development of treatment plan with patient and/or surrogate as well as nursing, discussions with consultants, evaluation of patient's response to treatment, examination of patient, obtaining history from patient or surrogate, ordering and performing treatments and interventions, ordering and review of laboratory studies, ordering and review of radiographic studies, pulse oximetry and re-evaluation of patient's condition.  Length of Stay: [place], MD  04/06/2021, 5:59 PM  Advanced Heart Failure Team Pager [phone] (M-F; 7a - 5p)  Please contact [facility] for night-coverage after hours (5p -7a ) and weekends on [email or web address]

## 2021-04-06 NOTE — Anesthesia Procedure Notes (Signed)
Procedure Name: Intubation Date/Time: 04/06/2021 8:02 AM Performed by: Colin Benton, CRNA Pre-anesthesia Checklist: Patient identified, Emergency Drugs available, Suction available and Patient being monitored Patient Re-evaluated:Patient Re-evaluated prior to induction Oxygen Delivery Method: Circle system utilized Preoxygenation: Pre-oxygenation with 100% oxygen Induction Type: IV induction Ventilation: Mask ventilation without difficulty Laryngoscope Size: Glidescope and 3 Grade View: Grade I Tube type: Oral Tube size: 8.0 mm Number of attempts: 1 Airway Equipment and Method: Lighted stylet and Rigid stylet Placement Confirmation: ETT inserted through vocal cords under direct vision, positive ETCO2 and breath sounds checked- equal and bilateral Secured at: 23 cm Tube secured with: Tape Dental Injury: Teeth and Oropharynx as per pre-operative assessment

## 2021-04-07 ENCOUNTER — Inpatient Hospital Stay (HOSPITAL_COMMUNITY): Payer: Medicare Other

## 2021-04-07 DIAGNOSIS — I5022 Chronic systolic (congestive) heart failure: Secondary | ICD-10-CM | POA: Diagnosis not present

## 2021-04-07 DIAGNOSIS — J9621 Acute and chronic respiratory failure with hypoxia: Secondary | ICD-10-CM | POA: Diagnosis not present

## 2021-04-07 DIAGNOSIS — Z95811 Presence of heart assist device: Secondary | ICD-10-CM

## 2021-04-07 DIAGNOSIS — I255 Ischemic cardiomyopathy: Secondary | ICD-10-CM | POA: Diagnosis not present

## 2021-04-07 LAB — CBC
HCT: 23.2 % — ABNORMAL LOW (ref 39.0–52.0)
HCT: 25.9 % — ABNORMAL LOW (ref 39.0–52.0)
Hemoglobin: 7.7 g/dL — ABNORMAL LOW (ref 13.0–17.0)
Hemoglobin: 8.2 g/dL — ABNORMAL LOW (ref 13.0–17.0)
MCH: 25.3 pg — ABNORMAL LOW (ref 26.0–34.0)
MCH: 26.1 pg (ref 26.0–34.0)
MCHC: 31.7 g/dL (ref 30.0–36.0)
MCHC: 33.2 g/dL (ref 30.0–36.0)
MCV: 78.6 fL — ABNORMAL LOW (ref 80.0–100.0)
MCV: 79.9 fL — ABNORMAL LOW (ref 80.0–100.0)
Platelets: 144 10*3/uL — ABNORMAL LOW (ref 150–400)
Platelets: 153 10*3/uL (ref 150–400)
RBC: 2.95 MIL/uL — ABNORMAL LOW (ref 4.22–5.81)
RBC: 3.24 MIL/uL — ABNORMAL LOW (ref 4.22–5.81)
RDW: 19.2 % — ABNORMAL HIGH (ref 11.5–15.5)
RDW: 19.4 % — ABNORMAL HIGH (ref 11.5–15.5)
WBC: 27 10*3/uL — ABNORMAL HIGH (ref 4.0–10.5)
WBC: 28.4 10*3/uL — ABNORMAL HIGH (ref 4.0–10.5)
nRBC: 0 % (ref 0.0–0.2)
nRBC: 0 % (ref 0.0–0.2)

## 2021-04-07 LAB — POCT I-STAT 7, (LYTES, BLD GAS, ICA,H+H)
Acid-Base Excess: 5 mmol/L — ABNORMAL HIGH (ref 0.0–2.0)
Acid-Base Excess: 6 mmol/L — ABNORMAL HIGH (ref 0.0–2.0)
Acid-Base Excess: 8 mmol/L — ABNORMAL HIGH (ref 0.0–2.0)
Acid-Base Excess: 8 mmol/L — ABNORMAL HIGH (ref 0.0–2.0)
Acid-Base Excess: 5 mmol/L — ABNORMAL HIGH (ref 0.0–2.0)
Acid-Base Excess: 3 mmol/L — ABNORMAL HIGH (ref 0.0–2.0)
Acid-Base Excess: 4 mmol/L — ABNORMAL HIGH (ref 0.0–2.0)
Bicarbonate: 32.2 mmol/L — ABNORMAL HIGH (ref 20.0–28.0)
Bicarbonate: 30.1 mmol/L — ABNORMAL HIGH (ref 20.0–28.0)
Bicarbonate: 31.6 mmol/L — ABNORMAL HIGH (ref 20.0–28.0)
Bicarbonate: 31.1 mmol/L — ABNORMAL HIGH (ref 20.0–28.0)
Bicarbonate: 29.5 mmol/L — ABNORMAL HIGH (ref 20.0–28.0)
Bicarbonate: 28.4 mmol/L — ABNORMAL HIGH (ref 20.0–28.0)
Bicarbonate: 29.1 mmol/L — ABNORMAL HIGH (ref 20.0–28.0)
Calcium, Ion: 1.13 mmol/L — ABNORMAL LOW (ref 1.15–1.40)
Calcium, Ion: 1.15 mmol/L (ref 1.15–1.40)
Calcium, Ion: 1.13 mmol/L — ABNORMAL LOW (ref 1.15–1.40)
Calcium, Ion: 1.17 mmol/L (ref 1.15–1.40)
Calcium, Ion: 1.16 mmol/L (ref 1.15–1.40)
Calcium, Ion: 1.18 mmol/L (ref 1.15–1.40)
Calcium, Ion: 1.18 mmol/L (ref 1.15–1.40)
HCT: 30 % — ABNORMAL LOW (ref 39.0–52.0)
HCT: 27 % — ABNORMAL LOW (ref 39.0–52.0)
HCT: 26 % — ABNORMAL LOW (ref 39.0–52.0)
HCT: 27 % — ABNORMAL LOW (ref 39.0–52.0)
HCT: 25 % — ABNORMAL LOW (ref 39.0–52.0)
HCT: 25 % — ABNORMAL LOW (ref 39.0–52.0)
HCT: 26 % — ABNORMAL LOW (ref 39.0–52.0)
Hemoglobin: 10.2 g/dL — ABNORMAL LOW (ref 13.0–17.0)
Hemoglobin: 9.2 g/dL — ABNORMAL LOW (ref 13.0–17.0)
Hemoglobin: 8.8 g/dL — ABNORMAL LOW (ref 13.0–17.0)
Hemoglobin: 9.2 g/dL — ABNORMAL LOW (ref 13.0–17.0)
Hemoglobin: 8.5 g/dL — ABNORMAL LOW (ref 13.0–17.0)
Hemoglobin: 8.5 g/dL — ABNORMAL LOW (ref 13.0–17.0)
Hemoglobin: 8.8 g/dL — ABNORMAL LOW (ref 13.0–17.0)
O2 Saturation: 98 %
O2 Saturation: 100 %
O2 Saturation: 100 %
O2 Saturation: 100 %
O2 Saturation: 100 %
O2 Saturation: 97 %
O2 Saturation: 97 %
Patient temperature: 37.8
Patient temperature: 37.9
Patient temperature: 37.8
Patient temperature: 37.8
Patient temperature: 37.4
Patient temperature: 37.1
Patient temperature: 36.3
Potassium: 2.3 mmol/L — CL (ref 3.5–5.1)
Potassium: 2.8 mmol/L — ABNORMAL LOW (ref 3.5–5.1)
Potassium: 3.3 mmol/L — ABNORMAL LOW (ref 3.5–5.1)
Potassium: 3.2 mmol/L — ABNORMAL LOW (ref 3.5–5.1)
Potassium: 3.2 mmol/L — ABNORMAL LOW (ref 3.5–5.1)
Potassium: 3.4 mmol/L — ABNORMAL LOW (ref 3.5–5.1)
Potassium: 3.6 mmol/L (ref 3.5–5.1)
Sodium: 137 mmol/L (ref 135–145)
Sodium: 136 mmol/L (ref 135–145)
Sodium: 136 mmol/L (ref 135–145)
Sodium: 135 mmol/L (ref 135–145)
Sodium: 135 mmol/L (ref 135–145)
Sodium: 137 mmol/L (ref 135–145)
Sodium: 134 mmol/L — ABNORMAL LOW (ref 135–145)
TCO2: 34 mmol/L — ABNORMAL HIGH (ref 22–32)
TCO2: 31 mmol/L (ref 22–32)
TCO2: 33 mmol/L — ABNORMAL HIGH (ref 22–32)
TCO2: 32 mmol/L (ref 22–32)
TCO2: 31 mmol/L (ref 22–32)
TCO2: 30 mmol/L (ref 22–32)
TCO2: 31 mmol/L (ref 22–32)
pCO2 arterial: 62 mmHg — ABNORMAL HIGH (ref 32.0–48.0)
pCO2 arterial: 42.8 mmHg (ref 32.0–48.0)
pCO2 arterial: 39.3 mmHg (ref 32.0–48.0)
pCO2 arterial: 38.6 mmHg (ref 32.0–48.0)
pCO2 arterial: 42.5 mmHg (ref 32.0–48.0)
pCO2 arterial: 45.2 mmHg (ref 32.0–48.0)
pCO2 arterial: 44.8 mmHg (ref 32.0–48.0)
pH, Arterial: 7.327 — ABNORMAL LOW (ref 7.350–7.450)
pH, Arterial: 7.459 — ABNORMAL HIGH (ref 7.350–7.450)
pH, Arterial: 7.516 — ABNORMAL HIGH (ref 7.350–7.450)
pH, Arterial: 7.516 — ABNORMAL HIGH (ref 7.350–7.450)
pH, Arterial: 7.451 — ABNORMAL HIGH (ref 7.350–7.450)
pH, Arterial: 7.407 (ref 7.350–7.450)
pH, Arterial: 7.418 (ref 7.350–7.450)
pO2, Arterial: 120 mmHg — ABNORMAL HIGH (ref 83.0–108.0)
pO2, Arterial: 201 mmHg — ABNORMAL HIGH (ref 83.0–108.0)
pO2, Arterial: 201 mmHg — ABNORMAL HIGH (ref 83.0–108.0)
pO2, Arterial: 218 mmHg — ABNORMAL HIGH (ref 83.0–108.0)
pO2, Arterial: 165 mmHg — ABNORMAL HIGH (ref 83.0–108.0)
pO2, Arterial: 94 mmHg (ref 83.0–108.0)
pO2, Arterial: 90 mmHg (ref 83.0–108.0)

## 2021-04-07 LAB — CBC WITH DIFFERENTIAL/PLATELET
Abs Immature Granulocytes: 0.48 10*3/uL — ABNORMAL HIGH (ref 0.00–0.07)
Basophils Absolute: 0.1 10*3/uL (ref 0.0–0.1)
Basophils Relative: 0 %
Eosinophils Absolute: 0 10*3/uL (ref 0.0–0.5)
Eosinophils Relative: 0 %
HCT: 26.2 % — ABNORMAL LOW (ref 39.0–52.0)
Hemoglobin: 8.9 g/dL — ABNORMAL LOW (ref 13.0–17.0)
Immature Granulocytes: 2 %
Lymphocytes Relative: 3 %
Lymphs Abs: 0.9 10*3/uL (ref 0.7–4.0)
MCH: 26.6 pg (ref 26.0–34.0)
MCHC: 34 g/dL (ref 30.0–36.0)
MCV: 78.4 fL — ABNORMAL LOW (ref 80.0–100.0)
Monocytes Absolute: 1.5 10*3/uL — ABNORMAL HIGH (ref 0.1–1.0)
Monocytes Relative: 6 %
Neutro Abs: 23.4 10*3/uL — ABNORMAL HIGH (ref 1.7–7.7)
Neutrophils Relative %: 89 %
Platelets: 163 10*3/uL (ref 150–400)
RBC: 3.34 MIL/uL — ABNORMAL LOW (ref 4.22–5.81)
RDW: 19.2 % — ABNORMAL HIGH (ref 11.5–15.5)
WBC: 26.3 10*3/uL — ABNORMAL HIGH (ref 4.0–10.5)
nRBC: 0.1 % (ref 0.0–0.2)

## 2021-04-07 LAB — BPAM FFP
Blood Product Expiration Date: 202211272359
Blood Product Expiration Date: 202211282359
Blood Product Expiration Date: 202211282359
Blood Product Expiration Date: 202211282359
Blood Product Expiration Date: 202211302359
Blood Product Expiration Date: 202211302359
ISSUE DATE / TIME: 202211250801
ISSUE DATE / TIME: 202211250801
ISSUE DATE / TIME: 202211250801
ISSUE DATE / TIME: 202211250801
ISSUE DATE / TIME: 202211252045
ISSUE DATE / TIME: 202211252045
Unit Type and Rh: 600
Unit Type and Rh: 6200
Unit Type and Rh: 6200
Unit Type and Rh: 6200
Unit Type and Rh: 5100
Unit Type and Rh: 5100

## 2021-04-07 LAB — BASIC METABOLIC PANEL
Anion gap: 7 (ref 5–15)
Anion gap: 7 (ref 5–15)
BUN: 26 mg/dL — ABNORMAL HIGH (ref 8–23)
BUN: 26 mg/dL — ABNORMAL HIGH (ref 8–23)
CO2: 28 mmol/L (ref 22–32)
CO2: 28 mmol/L (ref 22–32)
Calcium: 8.3 mg/dL — ABNORMAL LOW (ref 8.9–10.3)
Calcium: 8.3 mg/dL — ABNORMAL LOW (ref 8.9–10.3)
Chloride: 97 mmol/L — ABNORMAL LOW (ref 98–111)
Chloride: 99 mmol/L (ref 98–111)
Creatinine, Ser: 0.83 mg/dL (ref 0.61–1.24)
Creatinine, Ser: 0.78 mg/dL (ref 0.61–1.24)
GFR, Estimated: >60 mL/min (ref >60)
GFR, Estimated: >60 mL/min (ref >60)
Glucose, Bld: 156 mg/dL — ABNORMAL HIGH (ref 70–99)
Glucose, Bld: 107 mg/dL — ABNORMAL HIGH (ref 70–99)
Potassium: 3.6 mmol/L (ref 3.5–5.1)
Potassium: 3.6 mmol/L (ref 3.5–5.1)
Sodium: 132 mmol/L — ABNORMAL LOW (ref 135–145)
Sodium: 134 mmol/L — ABNORMAL LOW (ref 135–145)

## 2021-04-07 LAB — GLUCOSE, CAPILLARY
Glucose-Capillary: 110 mg/dL — ABNORMAL HIGH (ref 70–99)
Glucose-Capillary: 116 mg/dL — ABNORMAL HIGH (ref 70–99)
Glucose-Capillary: 116 mg/dL — ABNORMAL HIGH (ref 70–99)
Glucose-Capillary: 117 mg/dL — ABNORMAL HIGH (ref 70–99)
Glucose-Capillary: 130 mg/dL — ABNORMAL HIGH (ref 70–99)
Glucose-Capillary: 139 mg/dL — ABNORMAL HIGH (ref 70–99)
Glucose-Capillary: 142 mg/dL — ABNORMAL HIGH (ref 70–99)
Glucose-Capillary: 149 mg/dL — ABNORMAL HIGH (ref 70–99)
Glucose-Capillary: 159 mg/dL — ABNORMAL HIGH (ref 70–99)
Glucose-Capillary: 162 mg/dL — ABNORMAL HIGH (ref 70–99)
Glucose-Capillary: 170 mg/dL — ABNORMAL HIGH (ref 70–99)
Glucose-Capillary: 183 mg/dL — ABNORMAL HIGH (ref 70–99)
Glucose-Capillary: 97 mg/dL (ref 70–99)

## 2021-04-07 LAB — PREPARE PLATELET PHERESIS: Unit division: 0

## 2021-04-07 LAB — COMPREHENSIVE METABOLIC PANEL
ALT: 50 U/L — ABNORMAL HIGH (ref 0–44)
AST: 86 U/L — ABNORMAL HIGH (ref 15–41)
Albumin: 2.9 g/dL — ABNORMAL LOW (ref 3.5–5.0)
Alkaline Phosphatase: 51 U/L (ref 38–126)
Anion gap: 7 (ref 5–15)
BUN: 27 mg/dL — ABNORMAL HIGH (ref 8–23)
CO2: 28 mmol/L (ref 22–32)
Calcium: 8.2 mg/dL — ABNORMAL LOW (ref 8.9–10.3)
Chloride: 97 mmol/L — ABNORMAL LOW (ref 98–111)
Creatinine, Ser: 0.79 mg/dL (ref 0.61–1.24)
GFR, Estimated: >60 mL/min (ref >60)
Glucose, Bld: 109 mg/dL — ABNORMAL HIGH (ref 70–99)
Potassium: 3 mmol/L — ABNORMAL LOW (ref 3.5–5.1)
Sodium: 132 mmol/L — ABNORMAL LOW (ref 135–145)
Total Bilirubin: 4.4 mg/dL — ABNORMAL HIGH (ref 0.3–1.2)
Total Protein: 5.2 g/dL — ABNORMAL LOW (ref 6.5–8.1)

## 2021-04-07 LAB — PREPARE FRESH FROZEN PLASMA
Unit division: 0
Unit division: 0
Unit division: 0
Unit division: 0
Unit division: 0

## 2021-04-07 LAB — LACTATE DEHYDROGENASE: LDH: 309 U/L — ABNORMAL HIGH (ref 98–192)

## 2021-04-07 LAB — COOXEMETRY PANEL
Carboxyhemoglobin: 1.9 % — ABNORMAL HIGH (ref 0.5–1.5)
Methemoglobin: 1.3 % (ref 0.0–1.5)
O2 Saturation: 58.1 %
Total hemoglobin: 8.9 g/dL — ABNORMAL LOW (ref 12.0–16.0)

## 2021-04-07 LAB — BRAIN NATRIURETIC PEPTIDE: B Natriuretic Peptide: 663.2 pg/mL — ABNORMAL HIGH (ref 0.0–100.0)

## 2021-04-07 LAB — BPAM PLATELET PHERESIS
Blood Product Expiration Date: 202211272359
ISSUE DATE / TIME: 202211252228
Unit Type and Rh: 6200

## 2021-04-07 LAB — PHOSPHORUS: Phosphorus: 4.5 mg/dL (ref 2.5–4.6)

## 2021-04-07 LAB — PROTIME-INR
INR: 1.3 — ABNORMAL HIGH (ref 0.8–1.2)
Prothrombin Time: 16.3 seconds — ABNORMAL HIGH (ref 11.4–15.2)

## 2021-04-07 LAB — CALCIUM, IONIZED: Calcium, Ionized, Serum: 4.4 mg/dL — ABNORMAL LOW (ref 4.5–5.6)

## 2021-04-07 LAB — MAGNESIUM
Magnesium: 2.2 mg/dL (ref 1.7–2.4)
Magnesium: 2.1 mg/dL (ref 1.7–2.4)

## 2021-04-07 MED ORDER — POTASSIUM CHLORIDE 10 MEQ/50ML IV SOLN
10.0000 meq | INTRAVENOUS | Status: AC
  Administered 2021-04-07 (×4): 10 meq via INTRAVENOUS
  Filled 2021-04-07 (×5): qty 50

## 2021-04-07 MED ORDER — POTASSIUM CHLORIDE 20 MEQ PO PACK
40.0000 meq | PACK | Freq: Once | ORAL | Status: AC
  Administered 2021-04-07: 40 meq
  Filled 2021-04-07: qty 2

## 2021-04-07 MED ORDER — INSULIN DETEMIR 100 UNIT/ML ~~LOC~~ SOLN
20.0000 [IU] | Freq: Every day | SUBCUTANEOUS | Status: DC
  Administered 2021-04-07 – 2021-04-08 (×2): 20 [IU] via SUBCUTANEOUS
  Filled 2021-04-07 (×3): qty 0.2

## 2021-04-07 MED ORDER — FUROSEMIDE 10 MG/ML IJ SOLN
40.0000 mg | Freq: Once | INTRAMUSCULAR | Status: AC
  Administered 2021-04-07: 40 mg via INTRAVENOUS
  Filled 2021-04-07: qty 4

## 2021-04-07 MED ORDER — POLYVINYL ALCOHOL 1.4 % OP SOLN
1.0000 [drp] | OPHTHALMIC | Status: DC | PRN
  Administered 2021-04-07 – 2021-04-13 (×2): 1 [drp] via OPHTHALMIC
  Filled 2021-04-07: qty 15

## 2021-04-07 MED ORDER — TRAMADOL HCL 50 MG PO TABS
50.0000 mg | ORAL_TABLET | ORAL | Status: DC | PRN
  Administered 2021-04-07 – 2021-04-10 (×9): 50 mg via ORAL
  Filled 2021-04-07 (×9): qty 1

## 2021-04-07 MED ORDER — POTASSIUM CHLORIDE 10 MEQ/50ML IV SOLN
10.0000 meq | INTRAVENOUS | Status: AC
  Administered 2021-04-07 (×3): 10 meq via INTRAVENOUS
  Filled 2021-04-07 (×3): qty 50

## 2021-04-07 MED ORDER — POTASSIUM CHLORIDE CRYS ER 20 MEQ PO TBCR: 40.0000 meq | EXTENDED_RELEASE_TABLET | Freq: Once | ORAL | Status: DC

## 2021-04-07 MED ORDER — SODIUM CHLORIDE 0.9% IV SOLUTION: Freq: Once | INTRAVENOUS | Status: DC

## 2021-04-07 MED ORDER — ORAL CARE MOUTH RINSE
15.0000 mL | Freq: Two times a day (BID) | OROMUCOSAL | Status: DC
  Administered 2021-04-07 – 2021-05-07 (×47): 15 mL via OROMUCOSAL

## 2021-04-07 MED ORDER — POTASSIUM CHLORIDE 10 MEQ/50ML IV SOLN
10.0000 meq | INTRAVENOUS | Status: AC
  Administered 2021-04-07 (×6): 10 meq via INTRAVENOUS
  Filled 2021-04-07 (×6): qty 50

## 2021-04-07 MED ORDER — INSULIN ASPART 100 UNIT/ML IJ SOLN
0.0000 [IU] | INTRAMUSCULAR | Status: DC
Start: 2021-04-07 — End: 2021-04-12
  Administered 2021-04-07: 2 [IU] via SUBCUTANEOUS
  Administered 2021-04-07 – 2021-04-09 (×4): 3 [IU] via SUBCUTANEOUS
  Administered 2021-04-10: 8 [IU] via SUBCUTANEOUS
  Administered 2021-04-10: 3 [IU] via SUBCUTANEOUS
  Administered 2021-04-10 (×4): 2 [IU] via SUBCUTANEOUS
  Administered 2021-04-11: 3 [IU] via SUBCUTANEOUS
  Administered 2021-04-11 (×5): 2 [IU] via SUBCUTANEOUS

## 2021-04-07 MED ORDER — POTASSIUM CHLORIDE 10 MEQ/50ML IV SOLN
10.0000 meq | INTRAVENOUS | Status: AC
  Administered 2021-04-08 (×3): 10 meq via INTRAVENOUS
  Filled 2021-04-07 (×3): qty 50

## 2021-04-07 NOTE — Procedures (Signed)
Extubation Procedure Note  Patient Details:   Name: Brandon [name] DOB: 1955/11/22 MRN: [id number]   Airway Documentation:    Vent end date: 04/07/21 Vent end time: 0845   Evaluation  O2 sats: stable throughout Complications: No apparent complications Patient did tolerate procedure well. Bilateral Breath Sounds: Clear, Diminished   Yes NIF-26 FVC-850ml Placed on 4L with 5 ppm NO Incentive spirometer instructed 567ml  Revonda Standard 04/07/2021, 9:42 AM

## 2021-04-07 NOTE — Progress Notes (Signed)
Patient ID: Brandon [name], male   DOB: April 20, 1956, 65 y.o.   MRN: [id number]     Advanced Heart Failure Rounding Note  PCP-Cardiologist: [name], MD   Subjective:    Admitted with sepsis. Started on cefepime + vanc, now off abx (ID following). Afebrile.  10/29 Co-ox 48%, started on milrinone 0.25.  10/30 IV Antibiotics stopped. Blood cultures no growth for 5 days.  10/31 started on Augmentin for PNA.  11/1 Milrinone off 11/2 Milrinone added back. Midodrine increased to 10 mg TID 11/10 Developed Afib w/ RVR, converted with IV amio 11/14 Milrinone increased to 0.25 mcg. Diuresed with IV lasix  11/25 Underwent HM-3 VAD placement and bioprosthetic AVR. Had persistent hypotension in OR requiring methylene blue and high dose pressors.   Remains intubated but awake/following commands.  Weaning vent.  Off NO.    On milrinone 0.125, VP 0.04, NE 13, epi 3, amiodarone 30.  Weight up about 19 lbs post-op. MAP 90s.   LVAD speed increased to 5500 this morning.   Swan  CVP 9-10 PA 28/18 CI 2.5  LVAD Interrogation HM 3: Speed: 5500 Flow: 4.8 PI: 2.5 Power: 4.1. No PI events.    Objective:   Weight Range: 86.2 kg Body mass index is 25.77 kg/m.   Vital Signs:   Temp:  [99.5 F (37.5 C)-100.9 F (38.3 C)] 99.7 F (37.6 C) (11/26 0715) Pulse Rate:  [25-94] 92 (11/26 0808) Resp:  [0-26] 22 (11/26 0715) BP: (79-127)/(55-94) 97/81 (11/26 0600) SpO2:  [98 %-100 %] 100 % (11/26 0715) Arterial Line BP: (69-109)/(45-86) 100/79 (11/26 0715) FiO2 (%):  [50 %] 50 % (11/26 0330) Weight:  [86.2 kg] 86.2 kg (11/26 0500) Last BM Date: 04/05/21  Weight change: Filed Weights   04/05/21 0419 04/06/21 0500 04/07/21 0500  Weight: 79.6 kg 77.8 kg 86.2 kg    Intake/Output:   Intake/Output Summary (Last 24 hours) at 04/07/2021 0816 Last data filed at 04/07/2021 0800 Gross per 24 hour  Intake 11795.97 ml  Output 5161 ml  Net 6634.97 ml     Physical Exam   General: Well appearing  this am. NAD.  HEENT: Normal. Neck: Supple, JVP 12 cm. Carotids OK.  Cardiac:  Mechanical heart sounds with LVAD hum present.  Lungs:  CTAB, normal effort.  Abdomen:  NT, ND, no HSM. No bruits or masses. +BS  LVAD exit site: Well-healed and incorporated. Dressing dry and intact. No erythema or drainage. Stabilization device present and accurately applied. Driveline dressing changed daily per sterile technique. Extremities:  Warm and dry. No cyanosis, clubbing, rash. 1+ edema 1/2 to knees.  Neuro:  Alert & oriented x 3. Cranial nerves grossly intact. Moves all 4 extremities w/o difficulty. Affect pleasant     Telemetry   Sinus 90s Personally reviewed   Labs    CBC Recent Labs    04/07/21 0006 04/07/21 0308 04/07/21 0500 04/07/21 0525  WBC 28.4*  --  26.3*  --   NEUTROABS  --   --  23.4*  --   HGB 7.7*   < > 8.9* 9.2*  HCT 23.2*   < > 26.2* 27.0*  MCV 78.6*  --  78.4*  --   PLT 144*  --  163  --    < > = values in this interval not displayed.   Basic Metabolic Panel Recent Labs    04/06/21 2120 04/06/21 2134 04/07/21 0500 04/07/21 0525  NA 134*   < > 132* 135  K 2.8*   < >  3.0* 3.2*  CL 96*  --  97*  --   CO2 29  --  28  --   GLUCOSE 130*  --  109*  --   BUN 27*  --  27*  --   CREATININE 0.96  --  0.79  --   CALCIUM 8.3*  --  8.2*  --   MG 2.6*  --  2.2  --   PHOS  --   --  4.5  --    < > = values in this interval not displayed.   Liver Function Tests Recent Labs    04/07/21 0500  AST 86*  ALT 50*  ALKPHOS 51  BILITOT 4.4*  PROT 5.2*  ALBUMIN 2.9*    No results for input(s): LIPASE, AMYLASE in the last 72 hours. Cardiac Enzymes No results for input(s): CKTOTAL, CKMB, CKMBINDEX, TROPONINI in the last 72 hours.  BNP: BNP (last 3 results) Recent Labs    01/08/21 0731 01/18/21 1629 04/07/21 0500  BNP 734.2* 607.9* 663.2*    ProBNP (last 3 results) No results for input(s): PROBNP in the last 8760 hours.    D-Dimer No results for input(s):  DDIMER in the last 72 hours. Hemoglobin A1C No results for input(s): HGBA1C in the last 72 hours. Fasting Lipid Panel No results for input(s): CHOL, HDL, LDLCALC, TRIG, CHOLHDL, LDLDIRECT in the last 72 hours. Thyroid Function Tests No results for input(s): TSH, T4TOTAL, T3FREE, THYROIDAB in the last 72 hours.  Invalid input(s): FREET3  Other results:   Imaging    DG Chest Port 1 View  Result Date: 04/06/2021 CLINICAL DATA:  Post left ventricular assist device placement. EXAM: PORTABLE CHEST 1 VIEW COMPARISON:  Chest x-ray 03/21/2021. FINDINGS: Endotracheal tube tip is 4.6 cm above the carina. There is a new left ventricular cyst device in place. There are new sternotomy wires and prosthetic heart valve. Mediastinal drain and bilateral pleural drains are present. Swan-Ganz catheter is present with distal tip projecting over the main pulmonary artery. Right-sided generator is unchanged in position. The cardiomediastinal silhouette appears within normal limits. There are diffuse interstitial opacities, right greater than left, similar to the prior exam. There is no pleural effusion. There is likely a tiny right apical pneumothorax measuring 9 mm from the lung apex. IMPRESSION: 1. New postsurgical changes as above. 2. Small right apical pneumothorax. 3. Stable bilateral interstitial prominence. Electronically Signed   By: Ronney Asters M.D.   On: 04/06/2021 16:09     Medications:     Scheduled Medications:  sodium chloride   Intravenous Once   acetaminophen  1,000 mg Oral Q6H   Or   acetaminophen (TYLENOL) oral liquid 160 mg/5 mL  1,000 mg Per Tube Q6H   aspirin EC  325 mg Oral Daily   Or   aspirin  324 mg Per Tube Daily   Or   aspirin  300 mg Rectal Daily   bisacodyl  10 mg Oral Daily   Or   bisacodyl  10 mg Rectal Daily   chlorhexidine gluconate (MEDLINE KIT)  15 mL Mouth Rinse BID   Chlorhexidine Gluconate Cloth  6 each Topical Daily   digoxin  0.125 mg Oral Daily   docusate  sodium  200 mg Oral Daily   ezetimibe  10 mg Oral Daily   folic acid  1 mg Oral Daily   furosemide  40 mg Intravenous Once   gabapentin  200 mg Oral TID   insulin aspart  0-15 Units Subcutaneous Q4H  insulin detemir  20 Units Subcutaneous Daily   mouth rinse  15 mL Mouth Rinse 10 times per day   metoCLOPramide (REGLAN) injection  10 mg Intravenous Q6H   midodrine  10 mg Oral TID WC   multivitamin with minerals  1 tablet Oral Daily   [START ON 04/08/2021] pantoprazole  40 mg Oral Daily   sertraline  100 mg Oral Daily   sodium chloride flush  10-40 mL Intracatheter Q12H   sodium chloride flush  3 mL Intravenous Q12H   tamsulosin  0.4 mg Oral Daily   vitamin B-12  100 mcg Oral Daily    Infusions:  sodium chloride 10 mL/hr at 04/07/21 0700   sodium chloride     sodium chloride 20 mL/hr at 04/06/21 1510   albumin human 12.5 g (04/06/21 1730)   amiodarone 30 mg/hr (04/07/21 0700)    ceFAZolin (ANCEF) IV Stopped (04/07/21 0622)   dexmedetomidine (PRECEDEX) IV infusion 0.5 mcg/kg/hr (04/07/21 0700)   epinephrine 3 mcg/min (04/07/21 0700)   famotidine (PEPCID) IV Stopped (04/06/21 2312)   fluconazole (DIFLUCAN) IV 100 mL/hr at 04/07/21 0700   lactated ringers 20 mL/hr at 04/07/21 0700   lactated ringers 20 mL/hr at 04/06/21 2130   milrinone 0.125 mcg/kg/min (04/07/21 0700)   norepinephrine (LEVOPHED) Adult infusion 13 mcg/min (04/07/21 0700)   rifampin (RIFADIN) IVPB 600 mg (04/07/21 9485)   vancomycin Stopped (04/06/21 2129)   vasopressin 0.04 Units/min (04/07/21 0700)     PRN Medications: sodium chloride, albumin human, morphine injection, ondansetron (ZOFRAN) IV, oxyCODONE, sodium chloride flush, sodium chloride flush, traMADol    Patient Profile   Brandon [name] is a 65 year old with history of smoker, CAD s/p previous MI, HTN, HL, chronic HFeEF, sepsis 11/2020 bacteremia.  Admitted with sepsis. Bld Cx - NGTD    Assessment/Plan    1. Acute on chronic systolic HFr EF ->  cardiogenic shock - Echo 05/19/20 EF 20-25% RV mildly HK. moderate AS  Mean gradient 13 AVA 1.2 cm2 DI 0.30 - RHC (7/22): EF 20%, low filling pressures. CI 2.3.  - RHC 11/16  RA 2, PA 65/27 (44), PCW 28, SVR 960, CO 6.5,  CI 3.2 , PA 57%. Milrinone was increased to 0.375 mcg.  - Persistent NYHA IV. Admitted with shock - s/p HM-III VAD + bioprosthetic AVR on 11/25 - On multiple pressors immediately post-op due to severe vasoplegia.  - RV looked good on TEE in OR - MAP up to 90s this morning, now on milrinone 0.125, NE 13, epi 3, vasopressin 0.04.  Wean vasopressin today then NE.  - CVP 9-10, weight up 19 lbs.  Will give 1 dose Lasix 40 mg IV  this morning. Creatinine stable.   2. HM-3 LVAD implant - POD #0 - Plan as above - transfuse to keep hgb > 8.0 - VAD interrogated personally. Parameters stable. - Speed increased to 5500 on 11/25.  - ASA 325 until INR 1.8.  - Start anticoagulation when stable per surgery.   3. Acute Hypoxic Respiratory Failure  - Remains intubated post-op  - Weaning vent currently, off NO.  Hopefully extubate this morning.   4. 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 CAD  s/p anterior STEMI (12/20). LHC showed chronically occluded RCA (with L>>R collaterals) and thrombotic occlusion of proximal LAD. Underwent PCI of LAD. - Continue atorvastatin 80.   6. Iron-deficiency anemia & ABLA -  Rec'd Feraheme 11/8 and 11/13  - watch CTs - Keep hgb > 8.0 (9.2 today).   7. Hypokalemia - Consistently low.  Discussed replacement with pharmacist this morning, will repeat BMET in pm.   8. Severe protein-calorie malnutrition - prealbumin 11>15> 13 despite adequate caloric intake on calorie count - Continue aggressive nutritional support  9. Paroxysmal Atrial Fibrillation w/ RVR - new, developed 11/10. Now back in NSR   - Continue IV amio  10. Hyponatremia - Na 135  currently  11. Sepsis - H/o strep bacteremia 11/2020.  ICD extracted 12/04/20. Barostim was not removed as it is extravascular.  - Received IV antibiotics until 01/15/21. Blood cultures negative 01/30/21..  - 10/26 -Blood CX x2  Negative  - Ct chest concerning for pneumonia. Treated w/ cefepime/vancomycin -> Augmentin (stop 11/7) - Low grade post-op fever to 100, follow.   CRITICAL CARE Performed by: Loralie Champagne  Total critical care time: 40 minutes  Critical care time was exclusive of separately billable procedures and treating other patients.  Critical care was necessary to treat or prevent imminent or life-threatening deterioration.  Critical care was time spent personally by me (independent of midlevel providers or residents) on the following activities: development of treatment plan with patient and/or surrogate as well as nursing, discussions with consultants, evaluation of patient's response to treatment, examination of patient, obtaining history from patient or surrogate, ordering and performing treatments and interventions, ordering and review of laboratory studies, ordering and review of radiographic studies, pulse oximetry and re-evaluation of patient's condition.  Length of Stay: [place], MD  04/07/2021, 8:16 AM  Advanced Heart Failure Team Pager [phone] (M-F; 7a - 5p)  Please contact [facility] for night-coverage after hours (5p -7a ) and weekends on [email or web address]

## 2021-04-07 NOTE — Progress Notes (Signed)
Drive line dressing change:   LVAD driveline dressing was changed using proper sterile technique. Old dressing removed. Driveline site cleaned using chloroprep x2. Silver strip applied. New gauze dressing applied using sterile technique. Driveline secured at driveline site with 1 suture. Driveline site clean, no active drainage from site, no redness, no swelling, no tenderness, no odor from drainage. Driveline anchor intact and applied correctly.   NEXT DRESSING CHANGE DUE 04/08/2021.   Alma Friendly RN

## 2021-04-07 NOTE — Progress Notes (Addendum)
Patient ID: Brandon [name], male   DOB: 1955-11-19, 65 y.o.   MRN: [id number] [facility] 3 Rounding Note  Subjective:    Stable overnight. Received 1 unit PRBC's for Hgb 7.7. Increased to 8.9 this am. Replacing K+ all night. For some unknown reason his K+ has been low all through surgery and postop with continuous replacement.  On milrinone 0.125, epi 3, NE 13, vaso 0.04 with MAP 90's this am, CI 5, Co-ox 58%. CVP 5  LVAD INTERROGATION:  [facility] IIl LVAD:  Flow 4.0 liters/min, speed 5400, power 4, PI 5.2.    Objective:    Vital Signs:   Temp:  [99.5 F (37.5 C)-100.9 F (38.3 C)] 99.7 F (37.6 C) (11/26 0715) Pulse Rate:  [25-94] 88 (11/26 0715) Resp:  [0-26] 22 (11/26 0715) BP: (79-127)/(55-94) 97/81 (11/26 0600) SpO2:  [98 %-100 %] 100 % (11/26 0715) Arterial Line BP: (69-109)/(45-86) 100/79 (11/26 0715) FiO2 (%):  [50 %] 50 % (11/26 0330) Weight:  [86.2 kg] 86.2 kg (11/26 0500) Last BM Date: 04/05/21 Mean arterial Pressure 90's  Intake/Output:   Intake/Output Summary (Last 24 hours) at 04/07/2021 0717 Last data filed at 04/07/2021 0700 Gross per 24 hour  Intake 12345.97 ml  Output 5056 ml  Net 7289.97 ml     Physical Exam: General:  Well appearing on vent. No resp difficulty HEENT: intubated Neck: anklosing spondylitis  Cor: Normal heart sounds with LVAD hum present. Lungs: clear Abdomen: soft, nontender, nondistended. Good bowel sounds. Extremities: no cyanosis, clubbing, rash, mild edema Neuro: alert, moves all 4 extremities w/o difficulty. Affect pleasant  Telemetry: sinus 88  Labs: Basic Metabolic Panel: Recent Labs  Lab 04/01/21 1402 04/01/21 2224 04/05/21 0500 04/06/21 0337 04/06/21 0808 04/06/21 1313 04/06/21 1349 04/06/21 1352 04/06/21 1543 04/06/21 1551 04/06/21 2120 04/06/21 2134 04/07/21 0308 04/07/21 0500 04/07/21 0525  NA 130*   < > 128* 128*   < > 137   < > 138   < > 136 134* 136 136 132* 135  K 2.8*   < > 4.5 2.4*   < > 2.0*   < >  2.0*   < > 2.2* 2.8* 2.8* 3.3* 3.0* 3.2*  CL 89*   < > 97* 86*   < > 90*  --  91*  --  95* 96*  --   --  97*  --   CO2 31   < > 25 31  --   --   --   --   --  30 29  --   --  28  --   GLUCOSE 241*   < > 141* 183*   < > 175*  --  174*  --  111* 130*  --   --  109*  --   BUN 21   < > 26* 29*   < > 25*  --  24*  --  24* 27*  --   --  27*  --   CREATININE 0.74   < > 0.79 0.76   < > 0.80  --  0.80  --  0.97 0.96  --   --  0.79  --   CALCIUM 8.7*   < > 8.6* 8.7*  --   --   --   --   --  7.7* 8.3*  --   --  8.2*  --   MG  --    < > 2.0 2.0  --   --   --   --   --  2.0 2.6*  --   --  2.2  --   PHOS 4.0  --   --   --   --   --   --   --   --   --   --   --   --  4.5  --    < > = values in this interval not displayed.    Liver Function Tests: Recent Labs  Lab 04/04/21 0502 04/07/21 0500  AST 21 86*  ALT 26 50*  ALKPHOS 96 51  BILITOT 0.5 4.4*  PROT 7.2 5.2*  ALBUMIN 2.7* 2.9*   No results for input(s): LIPASE, AMYLASE in the last 168 hours. No results for input(s): AMMONIA in the last 168 hours.  CBC: Recent Labs  Lab 04/05/21 1000 04/06/21 0337 04/06/21 0808 04/06/21 1100 04/06/21 1110 04/06/21 1551 04/06/21 2134 04/07/21 0006 04/07/21 0308 04/07/21 0500 04/07/21 0525  WBC 17.6* 10.2  --   --   --  23.9*  --  28.4*  --  26.3*  --   NEUTROABS  --   --   --   --   --   --   --   --   --  23.4*  --   HGB 9.4* 8.3*   < > 7.4*   < > 8.8* 9.2* 7.7* 8.8* 8.9* 9.2*  HCT 30.3* 26.5*   < > 24.0*   < > 27.8* 27.0* 23.2* 26.0* 26.2* 27.0*  MCV 80.2 79.1*  --   --   --  80.6  --  78.6*  --  78.4*  --   PLT 335 301  --  306  --  110*  --  144*  --  163  --    < > = values in this interval not displayed.    INR: Recent Labs  Lab 04/06/21 1551 04/07/21 0500  INR 1.6* 1.3*    Other results: EKG:   Imaging: DG Chest Port 1 View  Result Date: 04/06/2021 CLINICAL DATA:  Post left ventricular assist device placement. EXAM: PORTABLE CHEST 1 VIEW COMPARISON:  Chest x-ray 03/21/2021.  FINDINGS: Endotracheal tube tip is 4.6 cm above the carina. There is a new left ventricular cyst device in place. There are new sternotomy wires and prosthetic heart valve. Mediastinal drain and bilateral pleural drains are present. Swan-Ganz catheter is present with distal tip projecting over the main pulmonary artery. Right-sided generator is unchanged in position. The cardiomediastinal silhouette appears within normal limits. There are diffuse interstitial opacities, right greater than left, similar to the prior exam. There is no pleural effusion. There is likely a tiny right apical pneumothorax measuring 9 mm from the lung apex. IMPRESSION: 1. New postsurgical changes as above. 2. Small right apical pneumothorax. 3. Stable bilateral interstitial prominence. Electronically Signed   By: Ronney Asters M.D.   On: 04/06/2021 16:09     Medications:     Scheduled Medications:  sodium chloride   Intravenous Once   acetaminophen  1,000 mg Oral Q6H   Or   acetaminophen (TYLENOL) oral liquid 160 mg/5 mL  1,000 mg Per Tube Q6H   aspirin EC  325 mg Oral Daily   Or   aspirin  324 mg Per Tube Daily   Or   aspirin  300 mg Rectal Daily   bisacodyl  10 mg Oral Daily   Or   bisacodyl  10 mg Rectal Daily   chlorhexidine gluconate (MEDLINE KIT)  15 mL Mouth Rinse BID  Chlorhexidine Gluconate Cloth  6 each Topical Daily   digoxin  0.125 mg Oral Daily   docusate sodium  200 mg Oral Daily   ezetimibe  10 mg Oral Daily   folic acid  1 mg Oral Daily   gabapentin  200 mg Oral TID   mouth rinse  15 mL Mouth Rinse 10 times per day   metoCLOPramide (REGLAN) injection  10 mg Intravenous Q6H   midodrine  10 mg Oral TID WC   multivitamin with minerals  1 tablet Oral Daily   [START ON 04/08/2021] pantoprazole  40 mg Oral Daily   sertraline  100 mg Oral Daily   sodium chloride flush  10-40 mL Intracatheter Q12H   sodium chloride flush  3 mL Intravenous Q12H   tamsulosin  0.4 mg Oral Daily   vitamin B-12  100  mcg Oral Daily    Infusions:  sodium chloride 10 mL/hr at 04/07/21 0700   sodium chloride     sodium chloride 20 mL/hr at 04/06/21 1510   albumin human 12.5 g (04/06/21 1730)   amiodarone 30 mg/hr (04/07/21 0700)    ceFAZolin (ANCEF) IV Stopped (04/07/21 0622)   dexmedetomidine (PRECEDEX) IV infusion 0.5 mcg/kg/hr (04/07/21 0700)   epinephrine 3 mcg/min (04/07/21 0700)   famotidine (PEPCID) IV Stopped (04/06/21 2312)   fluconazole (DIFLUCAN) IV 100 mL/hr at 04/07/21 0700   insulin 1.8 Units/hr (04/07/21 0700)   lactated ringers 20 mL/hr at 04/07/21 0700   lactated ringers 20 mL/hr at 04/06/21 2130   milrinone 0.125 mcg/kg/min (04/07/21 0700)   norepinephrine (LEVOPHED) Adult infusion 13 mcg/min (04/07/21 0700)   potassium chloride 10 mEq (04/07/21 0700)   rifampin (RIFADIN) IVPB     vancomycin Stopped (04/06/21 2129)   vasopressin 0.04 Units/min (04/07/21 0700)    PRN Medications: sodium chloride, albumin human, dextrose, morphine injection, ondansetron (ZOFRAN) IV, oxyCODONE, sodium chloride flush, sodium chloride flush, traMADol   Assessment:    POD 1 HM 3 LVAD and AVR with bioprosthetic valve for acute on chronic systolic heart failure with EF 20-25% and cardiogenic shock requiring milrinone. He had a bicupid AV with moderate AS and AI. Intraop severe vasoplegia requiring multiple vasopressors and methylene blue. RV function looked good. PAF with RVR maintaing sinus on IV amio. 3.   Anklosing spondylitis and UE peripheral neuropathy related to that. 4.   Severe protein-calorie malnutrition with prealbumin 13 despite good caloric intact and protein supplements. 5.   Strep bacteremia 11/2020 with ICD extraction 12/04/20 and IV antibiotics until 01/15/21. BC negative 01/30/21 and 03/07/21 when he presented again with signs of sepsis. CT chest was concerning for pneumonia and he was treated with Maxepime and Lucianne Lei, switched to Augmentin and stopped 03/19/2021. 6.   Hyponatremia 7.    Hypokalemia 8.   Iron deficiency anemia and acute postop blood loss anemia. 9.   Noted to have recent hemorrhagic pericarditis with heart encased in recent organized thrombus on Eliquis preop before stopping for surgery. 10.  Large bilateral pleural effusions drained at the time of surgery, related to heart failure.  Plan/Discussion:    He has been hemodynamically stable overnight. I increased LVAD speed to 5500 since he is very pulsatile and PI 4-5, CVP low. Wean vasopressin, then NE for MAP>75.   Tolerated NO wean overnight without change in hemodynamics so will wean vent to extubate this am. I suspect he may have some underlying lung disease based on his CXR and chronic anklosing spondylitis. PFT's showed moderate obstruction and diffusion  defect.  Chest tube output low. Will keep in today.  We will need to go very slowly with anticoagulation with recent hemorrhagic pericarditis. His visceral and parietal pericardium were raw.  Continue to replace K+  I reviewed the LVAD parameters from today, and compared the results to the patient's prior recorded data.  Increased fixed speed to 5500, low speed limit to 5200.  The LVAD is functioning within specified parameters.  LVAD interrogation was negative for any significant power changes, alarms or PI events/speed drops.  LVAD equipment check completed and is in good working order.  Back-up equipment present.   LVAD education done on emergency procedures and precautions and reviewed exit site care.  Length of Stay: [address]  Gaye Pollack 04/07/2021, 7:17 AM

## 2021-04-08 ENCOUNTER — Inpatient Hospital Stay (HOSPITAL_COMMUNITY): Payer: Medicare Other

## 2021-04-08 DIAGNOSIS — Z95811 Presence of heart assist device: Secondary | ICD-10-CM | POA: Diagnosis not present

## 2021-04-08 DIAGNOSIS — J9621 Acute and chronic respiratory failure with hypoxia: Secondary | ICD-10-CM | POA: Diagnosis not present

## 2021-04-08 DIAGNOSIS — I5022 Chronic systolic (congestive) heart failure: Secondary | ICD-10-CM | POA: Diagnosis not present

## 2021-04-08 DIAGNOSIS — I255 Ischemic cardiomyopathy: Secondary | ICD-10-CM | POA: Diagnosis not present

## 2021-04-08 LAB — COMPREHENSIVE METABOLIC PANEL
ALT: 37 U/L (ref 0–44)
AST: 53 U/L — ABNORMAL HIGH (ref 15–41)
Albumin: 2.7 g/dL — ABNORMAL LOW (ref 3.5–5.0)
Alkaline Phosphatase: 68 U/L (ref 38–126)
Anion gap: 9 (ref 5–15)
BUN: 24 mg/dL — ABNORMAL HIGH (ref 8–23)
CO2: 26 mmol/L (ref 22–32)
Calcium: 8.4 mg/dL — ABNORMAL LOW (ref 8.9–10.3)
Chloride: 95 mmol/L — ABNORMAL LOW (ref 98–111)
Creatinine, Ser: 0.69 mg/dL (ref 0.61–1.24)
GFR, Estimated: >60 mL/min (ref >60)
Glucose, Bld: 65 mg/dL — ABNORMAL LOW (ref 70–99)
Potassium: 3.8 mmol/L (ref 3.5–5.1)
Sodium: 130 mmol/L — ABNORMAL LOW (ref 135–145)
Total Bilirubin: 2.6 mg/dL — ABNORMAL HIGH (ref 0.3–1.2)
Total Protein: 5.6 g/dL — ABNORMAL LOW (ref 6.5–8.1)

## 2021-04-08 LAB — CBC WITH DIFFERENTIAL/PLATELET
Abs Immature Granulocytes: 0.32 10*3/uL — ABNORMAL HIGH (ref 0.00–0.07)
Basophils Absolute: 0.1 10*3/uL (ref 0.0–0.1)
Basophils Relative: 0 %
Eosinophils Absolute: 0 10*3/uL (ref 0.0–0.5)
Eosinophils Relative: 0 %
HCT: 24.6 % — ABNORMAL LOW (ref 39.0–52.0)
Hemoglobin: 8.4 g/dL — ABNORMAL LOW (ref 13.0–17.0)
Immature Granulocytes: 1 %
Lymphocytes Relative: 3 %
Lymphs Abs: 0.7 10*3/uL (ref 0.7–4.0)
MCH: 27.2 pg (ref 26.0–34.0)
MCHC: 34.1 g/dL (ref 30.0–36.0)
MCV: 79.6 fL — ABNORMAL LOW (ref 80.0–100.0)
Monocytes Absolute: 1.7 10*3/uL — ABNORMAL HIGH (ref 0.1–1.0)
Monocytes Relative: 7 %
Neutro Abs: 22.2 10*3/uL — ABNORMAL HIGH (ref 1.7–7.7)
Neutrophils Relative %: 89 %
Platelets: 141 10*3/uL — ABNORMAL LOW (ref 150–400)
RBC: 3.09 MIL/uL — ABNORMAL LOW (ref 4.22–5.81)
RDW: 19.7 % — ABNORMAL HIGH (ref 11.5–15.5)
WBC: 24.9 10*3/uL — ABNORMAL HIGH (ref 4.0–10.5)
nRBC: 0.1 % (ref 0.0–0.2)

## 2021-04-08 LAB — POCT I-STAT 7, (LYTES, BLD GAS, ICA,H+H)
Acid-Base Excess: 1 mmol/L (ref 0.0–2.0)
Bicarbonate: 25.8 mmol/L (ref 20.0–28.0)
Calcium, Ion: 1.21 mmol/L (ref 1.15–1.40)
HCT: 24 % — ABNORMAL LOW (ref 39.0–52.0)
Hemoglobin: 8.2 g/dL — ABNORMAL LOW (ref 13.0–17.0)
O2 Saturation: 93 %
Patient temperature: 36.9
Potassium: 3.7 mmol/L (ref 3.5–5.1)
Sodium: 132 mmol/L — ABNORMAL LOW (ref 135–145)
TCO2: 27 mmol/L (ref 22–32)
pCO2 arterial: 39.6 mmHg (ref 32.0–48.0)
pH, Arterial: 7.421 (ref 7.350–7.450)
pO2, Arterial: 65 mmHg — ABNORMAL LOW (ref 83.0–108.0)

## 2021-04-08 LAB — BASIC METABOLIC PANEL
Anion gap: 8 (ref 5–15)
BUN: 23 mg/dL (ref 8–23)
CO2: 27 mmol/L (ref 22–32)
Calcium: 8.5 mg/dL — ABNORMAL LOW (ref 8.9–10.3)
Chloride: 95 mmol/L — ABNORMAL LOW (ref 98–111)
Creatinine, Ser: 0.73 mg/dL (ref 0.61–1.24)
GFR, Estimated: >60 mL/min (ref >60)
Glucose, Bld: 103 mg/dL — ABNORMAL HIGH (ref 70–99)
Potassium: 3.4 mmol/L — ABNORMAL LOW (ref 3.5–5.1)
Sodium: 130 mmol/L — ABNORMAL LOW (ref 135–145)

## 2021-04-08 LAB — GLUCOSE, CAPILLARY
Glucose-Capillary: 108 mg/dL — ABNORMAL HIGH (ref 70–99)
Glucose-Capillary: 185 mg/dL — ABNORMAL HIGH (ref 70–99)
Glucose-Capillary: 53 mg/dL — ABNORMAL LOW (ref 70–99)
Glucose-Capillary: 63 mg/dL — ABNORMAL LOW (ref 70–99)
Glucose-Capillary: 90 mg/dL (ref 70–99)
Glucose-Capillary: 91 mg/dL (ref 70–99)
Glucose-Capillary: 95 mg/dL (ref 70–99)

## 2021-04-08 LAB — PHOSPHORUS: Phosphorus: 3.3 mg/dL (ref 2.5–4.6)

## 2021-04-08 LAB — COOXEMETRY PANEL
Carboxyhemoglobin: 1.8 % — ABNORMAL HIGH (ref 0.5–1.5)
Methemoglobin: 0.7 % (ref 0.0–1.5)
O2 Saturation: 66.5 %
Total hemoglobin: 8 g/dL — ABNORMAL LOW (ref 12.0–16.0)

## 2021-04-08 LAB — PROTIME-INR
INR: 1.3 — ABNORMAL HIGH (ref 0.8–1.2)
Prothrombin Time: 16.5 seconds — ABNORMAL HIGH (ref 11.4–15.2)

## 2021-04-08 LAB — LACTATE DEHYDROGENASE: LDH: 248 U/L — ABNORMAL HIGH (ref 98–192)

## 2021-04-08 LAB — MAGNESIUM: Magnesium: 2 mg/dL (ref 1.7–2.4)

## 2021-04-08 MED ORDER — WARFARIN SODIUM 2.5 MG PO TABS
2.5000 mg | ORAL_TABLET | Freq: Once | ORAL | Status: AC
  Administered 2021-04-08: 15:00:00 2.5 mg via ORAL
  Filled 2021-04-08: qty 1

## 2021-04-08 MED ORDER — MELATONIN 5 MG PO TABS
5.0000 mg | ORAL_TABLET | Freq: Every evening | ORAL | Status: DC | PRN
  Administered 2021-04-08 – 2021-04-15 (×7): 5 mg via ORAL
  Filled 2021-04-08 (×7): qty 1

## 2021-04-08 MED ORDER — POTASSIUM CHLORIDE CRYS ER 20 MEQ PO TBCR
40.0000 meq | EXTENDED_RELEASE_TABLET | Freq: Two times a day (BID) | ORAL | Status: AC
  Administered 2021-04-08 (×2): 40 meq via ORAL
  Filled 2021-04-08 (×2): qty 2

## 2021-04-08 MED ORDER — DIPHENHYDRAMINE HCL 25 MG PO CAPS
50.0000 mg | ORAL_CAPSULE | Freq: Every evening | ORAL | Status: DC | PRN
  Administered 2021-04-12: 50 mg via ORAL
  Filled 2021-04-08: qty 2

## 2021-04-08 MED ORDER — WARFARIN - PHARMACIST DOSING INPATIENT: Freq: Every day | Status: DC

## 2021-04-08 MED ORDER — POTASSIUM CHLORIDE CRYS ER 20 MEQ PO TBCR
20.0000 meq | EXTENDED_RELEASE_TABLET | ORAL | Status: AC
  Administered 2021-04-08 – 2021-04-09 (×3): 20 meq via ORAL
  Filled 2021-04-08 (×3): qty 1

## 2021-04-08 MED ORDER — FUROSEMIDE 10 MG/ML IJ SOLN
40.0000 mg | Freq: Two times a day (BID) | INTRAMUSCULAR | Status: DC
  Administered 2021-04-08 (×2): 40 mg via INTRAVENOUS
  Filled 2021-04-08 (×2): qty 4

## 2021-04-08 NOTE — Progress Notes (Signed)
Patient ID: Brandon [name], male   DOB: 1956-03-25, 65 y.o.   MRN: [id number] [facility] 3 Rounding Note  Subjective:    No complaints. Pain under reasonable control.  CI 3.1, Co-ox 66.5%, CVP 7-13  on milrinone 0.125, epi 3, NE 9  + 570 cc/24 hrs. Wt is 17 lbs over preop  LVAD INTERROGATION:  [facility] IIl LVAD:  Flow 4.7 liters/min, speed 5500, power 4, PI 3.8.    Objective:    Vital Signs:   Temp:  [97.3 F (36.3 C)-99.3 F (37.4 C)] 98.2 F (36.8 C) (11/27 0800) Pulse Rate:  [34-130] 97 (11/27 0800) Resp:  [0-34] 18 (11/27 0800) BP: (85-109)/(25-97) 100/84 (11/27 0800) SpO2:  [83 %-100 %] 99 % (11/27 0800) Arterial Line BP: (97-186)/(81-182) 186/182 (11/26 0845) Weight:  [85 kg] 85 kg (11/27 0500) Last BM Date: 04/05/21 Mean arterial Pressure 70's-80's  Intake/Output:   Intake/Output Summary (Last 24 hours) at 04/08/2021 0821 Last data filed at 04/08/2021 0800 Gross per 24 hour  Intake 3442.61 ml  Output 3046 ml  Net 396.61 ml     Physical Exam: General:  Well appearing. No resp difficulty HEENT: normal Neck: anklosing spondylitis Cor: Normal heart sounds with LVAD hum present. Lungs: clear Abdomen: soft, nontender, nondistended. Good bowel sounds. Extremities: no cyanosis, clubbing, rash, mild edema Neuro: alert & orientedx3, cranial nerves grossly intact. moves all 4 extremities w/o difficulty. Affect pleasant  Telemetry: sinus 98 on IV amio  Labs: Basic Metabolic Panel: Recent Labs  Lab 04/01/21 1402 04/01/21 2224 04/06/21 1551 04/06/21 2120 04/06/21 2134 04/07/21 0500 04/07/21 0525 04/07/21 1557 04/07/21 1559 04/07/21 2257 04/08/21 0359 04/08/21 0528  NA 130*   < > 136 134*   < > 132*   < > 132* 134* 134* 130* 132*  K 2.8*   < > 2.2* 2.8*   < > 3.0*   < > 3.6 3.6 3.6 3.8 3.7  CL 89*   < > 95* 96*  --  97*  --  97*  --  99 95*  --   CO2 31   < > 30 29  --  28  --  28  --  28 26  --   GLUCOSE 241*   < > 111* 130*  --  109*  --  156*  --  107*  65*  --   BUN 21   < > 24* 27*  --  27*  --  26*  --  26* 24*  --   CREATININE 0.74   < > 0.97 0.96  --  0.79  --  0.83  --  0.78 0.69  --   CALCIUM 8.7*   < > 7.7* 8.3*  --  8.2*  --  8.3*  --  8.3* 8.4*  --   MG  --    < > 2.0 2.6*  --  2.2  --  2.1  --   --  2.0  --   PHOS 4.0  --   --   --   --  4.5  --   --   --   --  3.3  --    < > = values in this interval not displayed.    Liver Function Tests: Recent Labs  Lab 04/04/21 0502 04/07/21 0500 04/08/21 0359  AST 21 86* 53*  ALT 26 50* 37  ALKPHOS 96 51 68  BILITOT 0.5 4.4* 2.6*  PROT 7.2 5.2* 5.6*  ALBUMIN 2.7* 2.9* 2.7*   No  results for input(s): LIPASE, AMYLASE in the last 168 hours. No results for input(s): AMMONIA in the last 168 hours.  CBC: Recent Labs  Lab 04/06/21 1551 04/06/21 2134 04/07/21 0006 04/07/21 0308 04/07/21 0500 04/07/21 0525 04/07/21 0954 04/07/21 1557 04/07/21 1559 04/08/21 0359 04/08/21 0528  WBC 23.9*  --  28.4*  --  26.3*  --   --  27.0*  --  24.9*  --   NEUTROABS  --   --   --   --  23.4*  --   --   --   --  22.2*  --   HGB 8.8*   < > 7.7*   < > 8.9*   < > 8.5* 8.2* 8.8* 8.4* 8.2*  HCT 27.8*   < > 23.2*   < > 26.2*   < > 25.0* 25.9* 26.0* 24.6* 24.0*  MCV 80.6  --  78.6*  --  78.4*  --   --  79.9*  --  79.6*  --   PLT 110*  --  144*  --  163  --   --  153  --  141*  --    < > = values in this interval not displayed.    INR: Recent Labs  Lab 04/06/21 1551 04/07/21 0500 04/08/21 0359  INR 1.6* 1.3* 1.3*    Other results: EKG:   Imaging: DG CHEST PORT 1 VIEW  Result Date: 04/08/2021 CLINICAL DATA:  Follow-up right pneumothorax. Congestive heart failure. Left ventricular assist device. EXAM: PORTABLE CHEST 1 VIEW COMPARISON:  04/07/2021 FINDINGS: Endotracheal tube has been removed. Other support apparatus remains in appropriate position including bilateral chest tubes. Previously seen tiny right apical pneumothorax is no longer visualized. Stable mild cardiomegaly. Diffuse  bilateral airspace disease with bibasilar predominance shows no significant change. Increased atelectasis or consolidation is noted in the left retrocardiac lung base. Previous aortic valve replacement noted. IMPRESSION: atelectasis or consolidation in left retrocardiac lung base. No significant change in diffuse bilateral airspace disease. Previously seen tiny right apical pneumothorax is no longer visualized. Electronically Signed   By: Marlaine Hind M.D.   On: 04/08/2021 08:15   DG Chest Port 1 View  Result Date: 04/07/2021 CLINICAL DATA:  Acute respiratory failure. Endotracheally intubated. Hypoxia. EXAM: PORTABLE CHEST 1 VIEW COMPARISON:  04/06/2021 FINDINGS: Support apparatus remains in stable position including bilateral chest tubes. A tiny less than 5% right apical pneumothorax appears slightly decreased since previous study. Diffuse symmetric interstitial and airspace disease shows no significant change, consistent with pulmonary edema. Cardiomegaly remains stable. IMPRESSION: Slight decrease in tiny less than 5% right apical pneumothorax. No significant change in diffuse pulmonary edema. Electronically Signed   By: Marlaine Hind M.D.   On: 04/07/2021 08:27   DG Chest Port 1 View  Result Date: 04/06/2021 CLINICAL DATA:  Post left ventricular assist device placement. EXAM: PORTABLE CHEST 1 VIEW COMPARISON:  Chest x-ray 03/21/2021. FINDINGS: Endotracheal tube tip is 4.6 cm above the carina. There is a new left ventricular cyst device in place. There are new sternotomy wires and prosthetic heart valve. Mediastinal drain and bilateral pleural drains are present. Swan-Ganz catheter is present with distal tip projecting over the main pulmonary artery. Right-sided generator is unchanged in position. The cardiomediastinal silhouette appears within normal limits. There are diffuse interstitial opacities, right greater than left, similar to the prior exam. There is no pleural effusion. There is likely a tiny  right apical pneumothorax measuring 9 mm from the lung apex. IMPRESSION: 1. New  postsurgical changes as above. 2. Small right apical pneumothorax. 3. Stable bilateral interstitial prominence. Electronically Signed   By: Ronney Asters M.D.   On: 04/06/2021 16:09     Medications:     Scheduled Medications:  sodium chloride   Intravenous Once   acetaminophen  1,000 mg Oral Q6H   Or   acetaminophen (TYLENOL) oral liquid 160 mg/5 mL  1,000 mg Per Tube Q6H   aspirin EC  325 mg Oral Daily   Or   aspirin  324 mg Per Tube Daily   Or   aspirin  300 mg Rectal Daily   bisacodyl  10 mg Oral Daily   Or   bisacodyl  10 mg Rectal Daily   Chlorhexidine Gluconate Cloth  6 each Topical Daily   digoxin  0.125 mg Oral Daily   docusate sodium  200 mg Oral Daily   ezetimibe  10 mg Oral Daily   folic acid  1 mg Oral Daily   furosemide  40 mg Intravenous BID   gabapentin  200 mg Oral TID   insulin aspart  0-15 Units Subcutaneous Q4H   insulin detemir  20 Units Subcutaneous Daily   mouth rinse  15 mL Mouth Rinse BID   metoCLOPramide (REGLAN) injection  10 mg Intravenous Q6H   midodrine  10 mg Oral TID WC   multivitamin with minerals  1 tablet Oral Daily   pantoprazole  40 mg Oral Daily   potassium chloride  40 mEq Oral BID   sertraline  100 mg Oral Daily   sodium chloride flush  10-40 mL Intracatheter Q12H   sodium chloride flush  3 mL Intravenous Q12H   tamsulosin  0.4 mg Oral Daily   vitamin B-12  100 mcg Oral Daily    Infusions:  sodium chloride Stopped (04/07/21 0943)   sodium chloride     sodium chloride Stopped (04/08/21 0727)   amiodarone 30 mg/hr (04/08/21 0800)    ceFAZolin (ANCEF) IV Stopped (04/08/21 0549)   epinephrine 3 mcg/min (04/08/21 0800)   lactated ringers 20 mL/hr at 04/08/21 0800   lactated ringers 20 mL/hr at 04/06/21 2130   milrinone 0.125 mcg/kg/min (04/08/21 0800)   norepinephrine (LEVOPHED) Adult infusion 9 mcg/min (04/08/21 0800)   vancomycin 200 mL/hr at 04/08/21  0800    PRN Medications: sodium chloride, morphine injection, ondansetron (ZOFRAN) IV, oxyCODONE, polyvinyl alcohol, sodium chloride flush, sodium chloride flush, traMADol   Assessment:    POD 2 HM 3 LVAD and AVR with bioprosthetic valve for acute on chronic systolic heart failure with EF 20-25% and cardiogenic shock requiring milrinone. He had a bicupid AV with moderate AS and AI. Intraop severe vasoplegia requiring multiple vasopressors and methylene blue. RV function looked good. PAF with RVR maintaing sinus on IV amio. 3.   Anklosing spondylitis and UE peripheral neuropathy related to that. 4.   Severe protein-calorie malnutrition with prealbumin 13 despite good caloric intact and protein supplements. 5.   Strep bacteremia 11/2020 with ICD extraction 12/04/20 and IV antibiotics until 01/15/21. BC negative 01/30/21 and 03/07/21 when he presented again with signs of sepsis. CT chest was concerning for pneumonia and he was treated with Maxepime and Lucianne Lei, switched to Augmentin and stopped 03/19/2021. 6.   Hyponatremia 7.   Hypokalemia 8.   Iron deficiency anemia and acute postop blood loss anemia. 9.   Noted to have recent hemorrhagic pericarditis with heart encased in recent organized thrombus on Eliquis preop before stopping for surgery. 10.  Large bilateral pleural  effusions drained at the time of surgery, related to heart failure.  Plan/Discussion:    He has been hemodynamically stable on current inotropes/vasopressors. Wean NE to keep MAP>70.   Volume excess: starting diuresis and continuing KCL  Chest tube output low. DC MT's and PT's and keep pocket drain.  DC swan.   Start Coumadin slowly with 2.5 mg since he had severe hemorrhagic pericarditis at time of surgery. If INR jumps up quickly he is likely to bleed around heart.  IS, OOB.  Advance diet and resume protein supplements.  I reviewed the LVAD parameters from today, and compared the results to the patient's prior recorded  data.  No programming changes were made.  The LVAD is functioning within specified parameters.  The patient performs LVAD self-test daily.  LVAD interrogation was negative for any significant power changes, alarms or PI events/speed drops.  LVAD equipment check completed and is in good working order.  Back-up equipment present.   LVAD education done on emergency procedures and precautions and reviewed exit site care.  Length of Stay: [place] 04/08/2021, 8:21 AM

## 2021-04-08 NOTE — Progress Notes (Signed)
Patient ID: Brandon [name], male   DOB: 20-May-1955, 65 y.o.   MRN: [id number] [facility] Evening Rounds:  Hemodynamically stable in sinus rhythm.   Milrinone 0.125, epi 3, NE 8  Diuresing well. Replacing K+  BMET    Component Value Date/Time   NA 130 (L) 04/08/2021 1508   NA 137 06/28/2020 0917   K 3.4 (L) 04/08/2021 1508   CL 95 (L) 04/08/2021 1508   CO2 27 04/08/2021 1508   GLUCOSE 103 (H) 04/08/2021 1508   BUN 23 04/08/2021 1508   BUN 15 06/28/2020 0917   CREATININE 0.73 04/08/2021 1508   CALCIUM 8.5 (L) 04/08/2021 1508   GFRNONAA >60 04/08/2021 1508   Ambulated in room today. Feels well.

## 2021-04-08 NOTE — Progress Notes (Signed)
Hypoglycemic Event  CBG: 63 0359  Treatment: 4 oz juice/soda  Symptoms: None  Follow-up CBG: Time: 1751 CBG Result:91  Possible Reasons for Event: Inadequate meal intake  Comments/MD notified: hypoglycemia protocol    Brandon [name]

## 2021-04-08 NOTE — Evaluation (Signed)
Occupational Therapy Evaluation Patient Details Name: Brandon [name] MRN: [id number] DOB: 12-14-1955 Today's Date: 04/08/2021   History of Present Illness Pt is a 65 y.o. male admitted 03/07/21 with acute on chronic hypoxemic respiratory failure, cardiogenic shock. CT scan which shows bronchopneumonia and no indication of ILD. Pt undergoing workup for VAD. LVAD placed 04/06/21.  Other PMH includes CHF, ankylosing spondylitis, bacteremia and  possible endocarditis s/p ICD removal, HTN, depression, arthritis.   Clinical Impression   Pt admitted with above. He demonstrates the below listed deficits and will benefit from continued OT to maximize safety and independence with BADLs.  Pt presents to OT with generalized weakness, decreased activity tolerance, mild balance deficits, decreased knowledge of precautions and equipment.  He currently requires mod A - max A for UB ADLs (due to lines/tubes), and max A for LB ADLs.   He was able to ambulate ~55 ft in the room with min A +2.  Sp02 88 with activity on 4L, but rebounded quickly to mid 90s with rest.  Pt reports he was mod I PTA, and he lives alone.  Anticipate he will make excellent progress.  OT will follow.        Recommendations for follow up therapy are one component of a multi-disciplinary discharge planning process, led by the attending physician.  Recommendations may be updated based on patient status, additional functional criteria and insurance authorization.   Follow Up Recommendations  No OT follow up    Assistance Recommended at Discharge Intermittent Supervision/Assistance  Functional Status Assessment  Patient has had a recent decline in their functional status and demonstrates the ability to make significant improvements in function in a reasonable and predictable amount of time.  Equipment Recommendations  BSC/3in1;Tub/shower seat    Recommendations for Other Services       Precautions / Restrictions Precautions Precautions:  Fall;Sternal;Other (comment) (LVAD; chest tube) Precaution Comments: LVAD; chest tube      Mobility Bed Mobility Overal bed mobility: Needs Assistance Bed Mobility: Sit to Sidelying         Sit to sidelying: Mod assist;+2 for physical assistance General bed mobility comments: Pt instructed in safe technique.  required assist for LEs and to guide trunk down toward the bed    Transfers Overall transfer level: Needs assistance Equipment used:  (EVA walker) Transfers: Sit to/from Stand;Bed to chair/wheelchair/BSC Sit to Stand: Min assist;+2 physical assistance;+2 safety/equipment     Step pivot transfers: Min assist;+2 safety/equipment     General transfer comment: assist for balance and to boost from into standing from lower surface      Balance Overall balance assessment: Needs assistance Sitting-balance support: Feet supported Sitting balance-Leahy Scale: Fair     Standing balance support: Bilateral upper extremity supported;Reliant on assistive device for balance Standing balance-Leahy Scale: Poor                             ADL either performed or assessed with clinical judgement   ADL Overall ADL's : Needs assistance/impaired Eating/Feeding: Set up;Sitting   Grooming: Wash/dry hands;Wash/dry face;Oral care;Set up;Sitting   Upper Body Bathing: Moderate assistance;Sitting Upper Body Bathing Details (indicate cue type and reason): due to lines/tubes Lower Body Bathing: Maximal assistance;Sit to/from stand   Upper Body Dressing : Maximal assistance;Sitting Upper Body Dressing Details (indicate cue type and reason): due to lines/tubes Lower Body Dressing: Total assistance;Sit to/from stand   Toilet Transfer: Minimal assistance;+2 for safety/equipment;Ambulation;BSC/3in1 (EVA walker)   Toileting-  Clothing Manipulation and Hygiene: Maximal assistance;Sit to/from stand       Functional mobility during ADLs: Minimal assistance;+2 for safety/equipment  (EVA walker)       Vision Baseline Vision/History: 1 Wears glasses Ability to See in Adequate Light: 0 Adequate Patient Visual Report: No change from baseline       Perception     Praxis      Pertinent Vitals/Pain Pain Assessment: Faces Faces Pain Scale: Hurts a little bit Pain Location: back, chest Pain Descriptors / Indicators: Sore;Discomfort Pain Intervention(s): Monitored during session     Hand Dominance Right   Extremity/Trunk Assessment Upper Extremity Assessment Upper Extremity Assessment: RUE deficits/detail RUE Deficits / Details: hx of DVT in R UE a few months ago, reports decreased pinch and grip strength. Impaired sensation but functional RUE Sensation: decreased light touch RUE Coordination: decreased fine motor   Lower Extremity Assessment Lower Extremity Assessment: Defer to PT evaluation   Cervical / Trunk Assessment Cervical / Trunk Assessment: Kyphotic (mild)   Communication Communication Communication: No difficulties   Cognition Arousal/Alertness: Awake/alert Behavior During Therapy: WFL for tasks assessed/performed Overall Cognitive Status: Within Functional Limits for tasks assessed                                       General Comments  Sp02 88% with activity with pt on 4L supplemental 02.  Rebounded to mid 90s quickly with rest    Exercises     Shoulder Instructions      Home Living Family/patient expects to be discharged to:: Private residence Living Arrangements: Alone Available Help at Discharge: Family;Neighbor;Available PRN/intermittently Type of Home: Other(Comment) (townhouse) Home Access: Stairs to enter [employer] of Steps: 1 Entrance Stairs-Rails: None Home Layout: One level     Bathroom Shower/Tub: Walk-in shower;Tub/shower unit   Bathroom Toilet: Standard Bathroom Accessibility: Yes   Home Equipment: Rollator (4 wheels)          Prior Functioning/Environment Prior Level of  Function : Independent/Modified Independent;Driving             Mobility Comments: Independent without DME; tries to walk daily, but most recently limited by SOB the past week ADLs Comments: Independent with ADLs, IADLs in the home. Reports he has not been driving as much and sister occasionally helps with groceries. Had been working part time as Solicitor up until a few months ago.        OT Problem List: Decreased activity tolerance;Decreased strength;Impaired balance (sitting and/or standing);Decreased knowledge of use of DME or AE;Decreased knowledge of precautions;Cardiopulmonary status limiting activity;Pain      OT Treatment/Interventions: Self-care/ADL training;Energy conservation;DME and/or AE instruction;Therapeutic activities;Patient/family education;Balance training    OT Goals(Current goals can be found in the care plan section) Acute Rehab OT Goals Patient Stated Goal: to be able to take care of self OT Goal Formulation: With patient Time For Goal Achievement: 04/22/21 Potential to Achieve Goals: Good ADL Goals Pt Will Perform Grooming: with supervision;standing Pt Will Perform Upper Body Bathing: with set-up;with supervision;sitting Pt Will Perform Lower Body Bathing: with set-up;with supervision;sit to/from stand;with adaptive equipment Pt Will Perform Upper Body Dressing: with supervision;sitting;with adaptive equipment Pt Will Perform Lower Body Dressing: with supervision;sit to/from stand;with adaptive equipment Pt Will Transfer to Toilet: with supervision;ambulating;regular height toilet;bedside commode;grab bars Pt Will Perform Toileting - Clothing Manipulation and hygiene: with supervision;sit to/from stand Additional ADL Goal #1: Pt will be independent  with equipment management during ADLs  OT Frequency: Min 2X/week   Barriers to D/C:            Co-evaluation PT/OT/SLP Co-Evaluation/Treatment: Yes Reason for Co-Treatment: To address  functional/ADL transfers;For patient/therapist safety   OT goals addressed during session: Strengthening/ROM;ADL's and self-care      AM-PAC OT "6 Clicks" Daily Activity     Outcome Measure Help from another person eating meals?: A Little Help from another person taking care of personal grooming?: A Little Help from another person toileting, which includes using toliet, bedpan, or urinal?: A Lot Help from another person bathing (including washing, rinsing, drying)?: A Lot Help from another person to put on and taking off regular upper body clothing?: A Lot Help from another person to put on and taking off regular lower body clothing?: Total 6 Click Score: 13   End of Session Equipment Utilized During Treatment: Oxygen;Other (comment) (EVA walker) Nurse Communication: Mobility status  Activity Tolerance: Patient tolerated treatment well Patient left: in bed;with call bell/phone within reach;with nursing/sitter in room  OT Visit Diagnosis: Unsteadiness on feet (R26.81);Muscle weakness (generalized) (M62.81);Pain                Time: 6599-3570 OT Time Calculation (min): 30 min Charges:  OT General Charges $OT Visit: 1 Visit OT Evaluation $OT Eval High Complexity: 1 High  Nilsa Nutting., OTR/L Acute Rehabilitation Services Pager [phone] Office [phone]   Lucille Passy M 04/08/2021, 1:52 PM

## 2021-04-08 NOTE — Progress Notes (Addendum)
ANTICOAGULATION CONSULT NOTE - Initial Consult  Pharmacy Consult for warfarin Indication:  s/p VAD (HM3)  No Active Allergies  Patient Measurements: Height: 6' (182.9 cm) Weight: 85 kg (187 lb 6.3 oz) IBW/kg (Calculated) : 77.6   Vital Signs: Temp: 98.4 F (36.9 C) (11/27 0815) Temp Source: Core (11/27 0800) BP: 109/95 (11/27 0900) Pulse Rate: 97 (11/27 0941)  Labs: Recent Labs    04/06/21 0337 04/06/21 0808 04/06/21 1551 04/06/21 2120 04/07/21 0500 04/07/21 0525 04/07/21 1557 04/07/21 1559 04/07/21 2257 04/08/21 0359 04/08/21 0528  HGB 8.3*   < > 8.8*   < > 8.9*   < > 8.2* 8.8*  --  8.4* 8.2*  HCT 26.5*   < > 27.8*   < > 26.2*   < > 25.9* 26.0*  --  24.6* 24.0*  PLT 301   < > 110*   < > 163  --  153  --   --  141*  --   APTT 64*  --  46*  --   --   --   --   --   --   --   --   LABPROT  --   --  19.3*  --  16.3*  --   --   --   --  16.5*  --   INR  --   --  1.6*  --  1.3*  --   --   --   --  1.3*  --   HEPARINUNFRC 0.27*  --   --   --   --   --   --   --   --   --   --   CREATININE 0.76   < > 0.97   < > 0.79  --  0.83  --  0.78 0.69  --    < > = values in this interval not displayed.    Estimated Creatinine Clearance: 98.3 mL/min (by C-G formula based on SCr of 0.69 mg/dL).   Medical History: Past Medical History:  Diagnosis Date   AICD (automatic cardioverter/defibrillator) present 08/31/2019   Ankylosing spondylitis ([place])    Arthritis    BENIGN PROSTATIC HYPERTROPHY 06/07/2008   CHF (congestive heart failure) ([place])    COLONIC POLYPS, HX OF 06/07/2008   Depression    H/O hiatal hernia    Heart failure ([place])    HYPERLIPIDEMIA 06/07/2008   HYPERTENSION 06/07/2008   Myocardial infarction [facility]) 2005   NSTEMI, s/p LAD stent   NEPHROLITHIASIS, HX OF 06/07/2008   STEMI (ST elevation myocardial infarction) ([place]) 04/27/2019    Medications:  Scheduled:   sodium chloride   Intravenous Once   acetaminophen  1,000 mg Oral Q6H   Or   acetaminophen (TYLENOL)  oral liquid 160 mg/5 mL  1,000 mg Per Tube Q6H   aspirin EC  325 mg Oral Daily   Or   aspirin  324 mg Per Tube Daily   Or   aspirin  300 mg Rectal Daily   bisacodyl  10 mg Oral Daily   Or   bisacodyl  10 mg Rectal Daily   Chlorhexidine Gluconate Cloth  6 each Topical Daily   digoxin  0.125 mg Oral Daily   docusate sodium  200 mg Oral Daily   ezetimibe  10 mg Oral Daily   folic acid  1 mg Oral Daily   furosemide  40 mg Intravenous BID   gabapentin  200 mg Oral TID   insulin aspart  0-15 Units  Subcutaneous Q4H   insulin detemir  20 Units Subcutaneous Daily   mouth rinse  15 mL Mouth Rinse BID   metoCLOPramide (REGLAN) injection  10 mg Intravenous Q6H   midodrine  10 mg Oral TID WC   multivitamin with minerals  1 tablet Oral Daily   pantoprazole  40 mg Oral Daily   potassium chloride  40 mEq Oral BID   sertraline  100 mg Oral Daily   sodium chloride flush  10-40 mL Intracatheter Q12H   sodium chloride flush  3 mL Intravenous Q12H   tamsulosin  0.4 mg Oral Daily   vitamin B-12  100 mcg Oral Daily    Assessment: 65 yo M s/p HM 3 and AVR on 04/06/21. PTA patient was on apixiban for a DVT. Patient had complicated post-op course with vasoplegia requiring methylene blue and severe hemorrhagic pericarditis at the time of surgery. Per Dr. Cyndia Bent, if INR jumps up quickly he is high risk to bleed around his heart. Of note the patient is also on amiodarone.  Given above will start conservatively with warfarin at 2.5 mg tonight. INR elevated at baseline at 1.3 today [facility]. Hemoglobin low at 8.2 and plts at 141. No signs of bleeding per RN.  Goal of Therapy:  Warfarin INR 2.0-2.5 Monitor platelets by anticoagulation protocol: Yes   Plan:  Start warfarin 2.5 mg PO x1 Continue aspirin 325 mg daily until INR > 1.8 then drop to 81 mg x 30 days s/p HM3 Daily INR and CBC   Cathrine Muster, PharmD PGY2 Cardiology Pharmacy Resident Phone: [phone] 04/08/2021  9:59 AM  Please  check [email or web address] for unit-specific pharmacy phone numbers.  Garry Nicolini A Saburo Luger 04/08/2021,9:47 AM

## 2021-04-08 NOTE — Progress Notes (Signed)
Patient ID: Brandon [name], male   DOB: 1955-10-13, 65 y.o.   MRN: [id number]     Advanced Heart Failure Rounding Note  PCP-Cardiologist: [name], MD   Subjective:    Admitted with sepsis. Started on cefepime + vanc, now off abx (ID following). Afebrile.  10/29 Co-ox 48%, started on milrinone 0.25.  10/30 IV Antibiotics stopped. Blood cultures no growth for 5 days.  10/31 started on Augmentin for PNA.  11/1 Milrinone off 11/2 Milrinone added back. Midodrine increased to 10 mg TID 11/10 Developed Afib w/ RVR, converted with IV amio 11/14 Milrinone increased to 0.25 mcg. Diuresed with IV lasix  11/25 Underwent HM-3 VAD placement and bioprosthetic AVR. Had persistent hypotension in OR requiring methylene blue and high dose pressors.  11/26 Extubated  Extubated, doing well on 4L [place].    On milrinone 0.125, NE 9, epi 3, amiodarone 30.  Lasix 40 mg IV x 1 yesterday, weight down 3 lbs.  Co-ox 67%, CVP 9-10.   Swan  CVP 9-10 PA 34/23 CI 3.1  LVAD Interrogation HM 3: Speed: 5500 Flow: 4.6 PI: 3.9 Power: 4.1. No PI events.    Objective:   Weight Range: 85 kg Body mass index is 25.41 kg/m.   Vital Signs:   Temp:  [97.3 F (36.3 C)-99.5 F (37.5 C)] 98.2 F (36.8 C) (11/27 0800) Pulse Rate:  [34-130] 97 (11/27 0800) Resp:  [0-34] 18 (11/27 0800) BP: (85-109)/(25-97) 100/84 (11/27 0800) SpO2:  [83 %-100 %] 99 % (11/27 0800) Arterial Line BP: (84-186)/(73-182) 186/182 (11/26 0845) FiO2 (%):  [40 %] 40 % (11/26 0820) Weight:  [85 kg] 85 kg (11/27 0500) Last BM Date: 04/05/21  Weight change: Filed Weights   04/06/21 0500 04/07/21 0500 04/08/21 0500  Weight: 77.8 kg 86.2 kg 85 kg    Intake/Output:   Intake/Output Summary (Last 24 hours) at 04/08/2021 0812 Last data filed at 04/08/2021 0800 Gross per 24 hour  Intake 3442.61 ml  Output 3046 ml  Net 396.61 ml     Physical Exam   General: Well appearing this am. NAD.  HEENT: Normal. Neck: Supple, JVP 10 cm.  Carotids OK.  Cardiac:  Mechanical heart sounds with LVAD hum present.  Lungs:  Decreased at bases.   Abdomen:  NT, ND, no HSM. No bruits or masses. +BS  LVAD exit site: Well-healed and incorporated. Dressing dry and intact. No erythema or drainage. Stabilization device present and accurately applied. Driveline dressing changed daily per sterile technique. Extremities:  Warm and dry. No cyanosis, clubbing, rash. Trace ankle edema.  Neuro:  Alert & oriented x 3. Cranial nerves grossly intact. Moves all 4 extremities w/o difficulty. Affect pleasant    Telemetry   Sinus 100s Personally reviewed   Labs    CBC Recent Labs    04/07/21 0500 04/07/21 0525 04/07/21 1557 04/07/21 1559 04/08/21 0359 04/08/21 0528  WBC 26.3*  --  27.0*  --  24.9*  --   NEUTROABS 23.4*  --   --   --  22.2*  --   HGB 8.9*   < > 8.2*   < > 8.4* 8.2*  HCT 26.2*   < > 25.9*   < > 24.6* 24.0*  MCV 78.4*  --  79.9*  --  79.6*  --   PLT 163  --  153  --  141*  --    < > = values in this interval not displayed.   Basic Metabolic Panel Recent Labs  04/07/21 0500 04/07/21 0525 04/07/21 1557 04/07/21 1559 04/07/21 2257 04/08/21 0359 04/08/21 0528  NA 132*   < > 132*   < > 134* 130* 132*  K 3.0*   < > 3.6   < > 3.6 3.8 3.7  CL 97*  --  97*  --  99 95*  --   CO2 28  --  28  --  28 26  --   GLUCOSE 109*  --  156*  --  107* 65*  --   BUN 27*  --  26*  --  26* 24*  --   CREATININE 0.79  --  0.83  --  0.78 0.69  --   CALCIUM 8.2*  --  8.3*  --  8.3* 8.4*  --   MG 2.2  --  2.1  --   --  2.0  --   PHOS 4.5  --   --   --   --  3.3  --    < > = values in this interval not displayed.   Liver Function Tests Recent Labs    04/07/21 0500 04/08/21 0359  AST 86* 53*  ALT 50* 37  ALKPHOS 51 68  BILITOT 4.4* 2.6*  PROT 5.2* 5.6*  ALBUMIN 2.9* 2.7*    No results for input(s): LIPASE, AMYLASE in the last 72 hours. Cardiac Enzymes No results for input(s): CKTOTAL, CKMB, CKMBINDEX, TROPONINI in the last 72  hours.  BNP: BNP (last 3 results) Recent Labs    01/08/21 0731 01/18/21 1629 04/07/21 0500  BNP 734.2* 607.9* 663.2*    ProBNP (last 3 results) No results for input(s): PROBNP in the last 8760 hours.    D-Dimer No results for input(s): DDIMER in the last 72 hours. Hemoglobin A1C No results for input(s): HGBA1C in the last 72 hours. Fasting Lipid Panel No results for input(s): CHOL, HDL, LDLCALC, TRIG, CHOLHDL, LDLDIRECT in the last 72 hours. Thyroid Function Tests No results for input(s): TSH, T4TOTAL, T3FREE, THYROIDAB in the last 72 hours.  Invalid input(s): FREET3  Other results:   Imaging    No results found.   Medications:     Scheduled Medications:  sodium chloride   Intravenous Once   acetaminophen  1,000 mg Oral Q6H   Or   acetaminophen (TYLENOL) oral liquid 160 mg/5 mL  1,000 mg Per Tube Q6H   aspirin EC  325 mg Oral Daily   Or   aspirin  324 mg Per Tube Daily   Or   aspirin  300 mg Rectal Daily   bisacodyl  10 mg Oral Daily   Or   bisacodyl  10 mg Rectal Daily   Chlorhexidine Gluconate Cloth  6 each Topical Daily   digoxin  0.125 mg Oral Daily   docusate sodium  200 mg Oral Daily   ezetimibe  10 mg Oral Daily   folic acid  1 mg Oral Daily   furosemide  40 mg Intravenous BID   gabapentin  200 mg Oral TID   insulin aspart  0-15 Units Subcutaneous Q4H   insulin detemir  20 Units Subcutaneous Daily   mouth rinse  15 mL Mouth Rinse BID   metoCLOPramide (REGLAN) injection  10 mg Intravenous Q6H   midodrine  10 mg Oral TID WC   multivitamin with minerals  1 tablet Oral Daily   pantoprazole  40 mg Oral Daily   potassium chloride  40 mEq Oral BID   sertraline  100 mg Oral Daily  sodium chloride flush  10-40 mL Intracatheter Q12H   sodium chloride flush  3 mL Intravenous Q12H   tamsulosin  0.4 mg Oral Daily   vitamin B-12  100 mcg Oral Daily    Infusions:  sodium chloride Stopped (04/07/21 0943)   sodium chloride     sodium chloride  Stopped (04/08/21 0727)   amiodarone 30 mg/hr (04/08/21 0800)    ceFAZolin (ANCEF) IV Stopped (04/08/21 0549)   dexmedetomidine (PRECEDEX) IV infusion Stopped (04/07/21 0759)   epinephrine 3 mcg/min (04/08/21 0800)   lactated ringers 20 mL/hr at 04/08/21 0800   lactated ringers 20 mL/hr at 04/06/21 2130   milrinone 0.125 mcg/kg/min (04/08/21 0800)   norepinephrine (LEVOPHED) Adult infusion 9 mcg/min (04/08/21 0800)   vancomycin 200 mL/hr at 04/08/21 0800   vasopressin Stopped (04/07/21 1736)     PRN Medications: sodium chloride, morphine injection, ondansetron (ZOFRAN) IV, oxyCODONE, polyvinyl alcohol, sodium chloride flush, sodium chloride flush, traMADol    Patient Profile   Mr Bayus is a 65 year old with history of smoker, CAD s/p previous MI, HTN, HL, chronic HFeEF, sepsis 11/2020 bacteremia.  Admitted with sepsis. Bld Cx - NGTD    Assessment/Plan    1. Acute on chronic systolic HFr EF -> cardiogenic shock - Echo 05/19/20 EF 20-25% RV mildly HK. moderate AS  Mean gradient 13 AVA 1.2 cm2 DI 0.30 - RHC (7/22): EF 20%, low filling pressures. CI 2.3.  - RHC 11/16  RA 2, PA 65/27 (44), PCW 28, SVR 960, CO 6.5,  CI 3.2 , PA 57%. Milrinone was increased to 0.375 mcg.  - Persistent NYHA IV. Admitted with shock - s/p HM-III VAD + bioprosthetic AVR on 11/25 - On multiple pressors immediately post-op due to severe vasoplegia.  - RV looked good on TEE in OR - MAP 70s-80s this morning, now on milrinone 0.125, NE 9, epi 3.  Continue midodrine 10 tid and wean NE today.   - CVP 9-10, weight up from pre-op.  Lasix 40 mg IV bid today, replace K.  - Continue digoxin.  - Can remove Swan, follow CVP and co-ox.   2. HM-3 LVAD implant - POD #2 - Plan as above - transfuse to keep hgb > 8.0 - VAD interrogated personally. Parameters stable. - Speed increased to 5500 on 11/25.  - ASA 325 until INR 1.8.  - Start low dose warfarin today, slow uptitration with recent hemorrhagic pericarditis.    - Chest tube out today.   3. Acute Hypoxic Respiratory Failure  - Extubated 11/26, stable on 4L [place].    4. 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 CAD  s/p anterior STEMI (12/20). LHC showed chronically occluded RCA (with L>>R collaterals) and thrombotic occlusion of proximal LAD. Underwent PCI of LAD. - Continue atorvastatin 80.   6. Iron-deficiency anemia & ABLA - Rec'd Feraheme 11/8 and 11/13  - Keep hgb > 8.0 (8.4 today).   7. Hypokalemia - Better today, continue repletion with diuresis.   8. Severe protein-calorie malnutrition - prealbumin 11>15> 13 despite adequate caloric intake on calorie count - Continue aggressive nutritional support  9. Paroxysmal Atrial Fibrillation w/ RVR - new, developed 11/10. Now back in NSR   - Continue IV amio  10. Hyponatremia - Na 130 currently, fluid restrict.   11. Sepsis - H/o strep bacteremia 11/2020.  ICD extracted 12/04/20. Barostim was not removed as it is  extravascular.  - Received IV antibiotics until 01/15/21. Blood cultures negative 01/30/21..  - 10/26 -Blood CX x2  Negative  - Ct chest concerning for pneumonia. Treated w/ cefepime/vancomycin -> Augmentin (stop 11/7) - Now afebrile, WBCs 25 but coming down.   12. Hemorrhagic Pericarditis - Fibrin clot encasing heart at time of surgery.  Will need to be careful with anticoagulation (go slowly).   CRITICAL CARE Performed by: Loralie Champagne  Total critical care time: 40 minutes  Critical care time was exclusive of separately billable procedures and treating other patients.  Critical care was necessary to treat or prevent imminent or life-threatening deterioration.  Critical care was time spent personally by me (independent of midlevel providers or residents) on the following activities: development of treatment plan with patient and/or surrogate as well as nursing, discussions  with consultants, evaluation of patient's response to treatment, examination of patient, obtaining history from patient or surrogate, ordering and performing treatments and interventions, ordering and review of laboratory studies, ordering and review of radiographic studies, pulse oximetry and re-evaluation of patient's condition.  Length of Stay: [place], MD  04/08/2021, 8:12 AM  Advanced Heart Failure Team Pager [phone] (M-F; 7a - 5p)  Please contact [facility] for night-coverage after hours (5p -7a ) and weekends on [email or web address]

## 2021-04-08 NOTE — Progress Notes (Signed)
Drive line dressing change:    LVAD driveline dressing was changed using proper sterile technique. Old dressing removed. Driveline site cleaned using chloroprep x2. Silver strip applied. New gauze dressing applied using sterile technique. Driveline secured at driveline site with 1 suture. Driveline site clean, minimal serosanguinous drainage from site, no redness, no swelling, no tenderness, no odor from drainage. Driveline anchor intact and applied correctly.   NEXT DRESSING CHANGE DUE 04/09/2021.   Alma Friendly RN

## 2021-04-08 NOTE — Evaluation (Signed)
Physical Therapy Evaluation Patient Details Name: Brandon [name] MRN: [id number] DOB: [birth year] Today's Date: 04/08/2021  History of Present Illness  Pt is a 65 y.o. male admitted 03/07/21 with acute on chronic hypoxemic respiratory failure, cardiogenic shock. CT scan which shows bronchopneumonia and no indication of ILD. Pt undergoing workup for VAD. LVAD placed 04/06/21.  Other PMH includes CHF, ankylosing spondylitis, bacteremia and  possible endocarditis s/p ICD removal, HTN, depression, arthritis.  Clinical Impression   Patient is s/p above surgery resulting in functional limitations due to the deficits listed below (see PT Problem List). Patient was independent with all mobility PTA and currently requires 2 person min assist for standing and walking. Anticipate quick progress as pt is highly motivated and had was walking >600 ft just prior to surgery.  Patient will benefit from skilled PT to increase their independence and safety with mobility to allow discharge to the venue listed below.          Recommendations for follow up therapy are one component of a multi-disciplinary discharge planning process, led by the attending physician.  Recommendations may be updated based on patient status, additional functional criteria and insurance authorization.  Follow Up Recommendations No PT follow up    Assistance Recommended at Discharge PRN  Functional Status Assessment Patient has had a recent decline in their functional status and demonstrates the ability to make significant improvements in function in a reasonable and predictable amount of time.  Equipment Recommendations  None recommended by PT    Recommendations for Other Services       Precautions / Restrictions Precautions Precautions: Fall;Sternal;Other (comment) (LVAD; chest tube) Precaution Comments: LVAD; chest tube      Mobility  Bed Mobility Overal bed mobility: Needs Assistance Bed Mobility: Sit to Sidelying          Sit to sidelying: Mod assist;+2 for physical assistance General bed mobility comments: Pt instructed in safe technique.  required assist for LEs and to guide trunk down toward the bed    Transfers Overall transfer level: Needs assistance Equipment used:  (EVA walker) Transfers: Sit to/from Stand;Bed to chair/wheelchair/BSC Sit to Stand: Min assist;+2 physical assistance;+2 safety/equipment   Step pivot transfers: Min assist;+2 safety/equipment       General transfer comment: assist for balance and to boost from chair into standing from lower surface    Ambulation/Gait Ambulation/Gait assistance: Min assist;+2 physical assistance;+2 safety/equipment Gait Distance (Feet): 55 Feet Assistive device: Ethelene Hal Gait Pattern/deviations: Step-through pattern;Decreased stride length;Trunk flexed Gait velocity: Decreased     General Gait Details: Slow, steady gait with pt walking forward, backward, and even sideways in his room. Pt reported knees beginning to feel weak and need to sit after 55 ft  Stairs            Wheelchair Mobility    Modified Rankin (Stroke Patients Only)       Balance Overall balance assessment: Needs assistance Sitting-balance support: Feet supported Sitting balance-Leahy Scale: Fair     Standing balance support: Bilateral upper extremity supported;Reliant on assistive device for balance Standing balance-Leahy Scale: Poor                 High Level Balance Comments: mild posterior LOB when stepping backwards and tried to stop             Pertinent Vitals/Pain Pain Assessment: Faces Faces Pain Scale: Hurts a little bit Pain Location: back, chest Pain Descriptors / Indicators: Sore;Discomfort Pain Intervention(s): Limited activity within patient's tolerance;Monitored during  session    Home Living Family/patient expects to be discharged to:: Private residence Living Arrangements: Alone Available Help at Discharge:  Family;Neighbor;Available PRN/intermittently Type of Home: Other(Comment) (townhouse) Home Access: Stairs to enter Entrance Stairs-Rails: None Entrance Stairs-Number of Steps: 1   Home Layout: One level Home Equipment: Rollator (4 wheels) Additional Comments: reports getting rollator after last admission, but doesn't use it nor does he intend to    Prior Function Prior Level of Function : Independent/Modified Independent;Driving             Mobility Comments: Independent without DME; tries to walk daily, but most recently limited by SOB the past week ADLs Comments: Independent with ADLs, IADLs in the home. Reports he has not been driving as much and sister occasionally helps with groceries. Had been working part time as Solicitor up until a few months ago.     Hand Dominance   Dominant Hand: Right    Extremity/Trunk Assessment   Upper Extremity Assessment Upper Extremity Assessment: Defer to OT evaluation RUE Deficits / Details: hx of DVT in R UE a few months ago, reports decreased pinch and grip strength. Impaired sensation but functional RUE Sensation: decreased light touch RUE Coordination: decreased fine motor    Lower Extremity Assessment Lower Extremity Assessment: Generalized weakness    Cervical / Trunk Assessment Cervical / Trunk Assessment: Kyphotic (mild)  Communication   Communication: No difficulties  Cognition Arousal/Alertness: Awake/alert Behavior During Therapy: WFL for tasks assessed/performed Overall Cognitive Status: Within Functional Limits for tasks assessed                                          General Comments General comments (skin integrity, edema, etc.): Sp02 88% with activity with pt on 4L supplemental 02.  Rebounded to mid 90s quickly with rest    Exercises     Assessment/Plan    PT Assessment Patient needs continued PT services  PT Problem List Decreased activity tolerance;Decreased  mobility;Cardiopulmonary status limiting activity;Decreased strength;Decreased balance;Decreased knowledge of use of DME;Decreased knowledge of precautions;Pain       PT Treatment Interventions DME instruction;Gait training;Stair training;Functional mobility training;Therapeutic activities;Therapeutic exercise;Patient/family education    PT Goals (Current goals can be found in the Care Plan section)  Acute Rehab PT Goals Patient Stated Goal: return to walking without a device PT Goal Formulation: With patient Time For Goal Achievement: 04/22/21 Potential to Achieve Goals: Good    Frequency Min 4X/week   Barriers to discharge Decreased caregiver support      Co-evaluation PT/OT/SLP Co-Evaluation/Treatment: Yes Reason for Co-Treatment: Complexity of the patient's impairments (multi-system involvement);For patient/therapist safety;To address functional/ADL transfers PT goals addressed during session: Mobility/safety with mobility;Balance;Proper use of DME OT goals addressed during session: Strengthening/ROM;ADL's and self-care       AM-PAC PT "6 Clicks" Mobility  Outcome Measure Help needed turning from your back to your side while in a flat bed without using bedrails?: A Lot Help needed moving from lying on your back to sitting on the side of a flat bed without using bedrails?: A Lot Help needed moving to and from a bed to a chair (including a wheelchair)?: Total Help needed standing up from a chair using your arms (e.g., wheelchair or bedside chair)?: Total Help needed to walk in hospital room?: Total Help needed climbing 3-5 steps with a railing? : Total 6 Click Score: 8  End of Session Equipment Utilized During Treatment: Oxygen Activity Tolerance: Patient tolerated treatment well Patient left: in bed;with call bell/phone within reach Nurse Communication: Mobility status PT Visit Diagnosis: Difficulty in walking, not elsewhere classified (R26.2);Muscle weakness  (generalized) (M62.81)    Time: 1448-1856 PT Time Calculation (min) (ACUTE ONLY): 28 min   Charges:   PT Evaluation $PT Eval High Complexity: 1 High           Arby Barrette, PT Acute Rehabilitation Services  Pager [phone] Office [phone]   Rexanne Mano 04/08/2021, 2:29 PM

## 2021-04-08 NOTE — Plan of Care (Signed)
  Problem: Clinical Measurements: Goal: Ability to maintain clinical measurements within normal limits will improve Outcome: Progressing Goal: Respiratory complications will improve Outcome: Progressing   Problem: Coping: Goal: Level of anxiety will decrease Outcome: Progressing   Problem: Skin Integrity: Goal: Risk for impaired skin integrity will decrease Outcome: Progressing   Problem: Cardiac: Goal: Ability to achieve and maintain adequate cardiopulmonary perfusion will improve Outcome: Progressing   Problem: Cardiac: Goal: Ability to maintain an adequate cardiac output will improve Outcome: Progressing   Problem: Respiratory: Goal: Will regain and/or maintain adequate ventilation Outcome: Progressing

## 2021-04-09 ENCOUNTER — Inpatient Hospital Stay: Payer: Self-pay

## 2021-04-09 ENCOUNTER — Inpatient Hospital Stay (HOSPITAL_COMMUNITY): Payer: Medicare Other

## 2021-04-09 ENCOUNTER — Encounter (HOSPITAL_COMMUNITY): Payer: Self-pay | Admitting: Surgery

## 2021-04-09 DIAGNOSIS — I509 Heart failure, unspecified: Secondary | ICD-10-CM

## 2021-04-09 DIAGNOSIS — Z95811 Presence of heart assist device: Secondary | ICD-10-CM

## 2021-04-09 DIAGNOSIS — J9621 Acute and chronic respiratory failure with hypoxia: Secondary | ICD-10-CM | POA: Diagnosis not present

## 2021-04-09 LAB — COMPREHENSIVE METABOLIC PANEL
ALT: 29 U/L (ref 0–44)
AST: 34 U/L (ref 15–41)
Albumin: 2.5 g/dL — ABNORMAL LOW (ref 3.5–5.0)
Alkaline Phosphatase: 78 U/L (ref 38–126)
Anion gap: 7 (ref 5–15)
BUN: 18 mg/dL (ref 8–23)
CO2: 28 mmol/L (ref 22–32)
Calcium: 8.6 mg/dL — ABNORMAL LOW (ref 8.9–10.3)
Chloride: 97 mmol/L — ABNORMAL LOW (ref 98–111)
Creatinine, Ser: 0.6 mg/dL — ABNORMAL LOW (ref 0.61–1.24)
GFR, Estimated: >60 mL/min (ref >60)
Glucose, Bld: 102 mg/dL — ABNORMAL HIGH (ref 70–99)
Potassium: 3.6 mmol/L (ref 3.5–5.1)
Sodium: 132 mmol/L — ABNORMAL LOW (ref 135–145)
Total Bilirubin: 1.6 mg/dL — ABNORMAL HIGH (ref 0.3–1.2)
Total Protein: 5.6 g/dL — ABNORMAL LOW (ref 6.5–8.1)

## 2021-04-09 LAB — LACTATE DEHYDROGENASE: LDH: 215 U/L — ABNORMAL HIGH (ref 98–192)

## 2021-04-09 LAB — CBC WITH DIFFERENTIAL/PLATELET
Abs Immature Granulocytes: 0.15 10*3/uL — ABNORMAL HIGH (ref 0.00–0.07)
Basophils Absolute: 0 10*3/uL (ref 0.0–0.1)
Basophils Relative: 0 %
Eosinophils Absolute: 0.1 10*3/uL (ref 0.0–0.5)
Eosinophils Relative: 0 %
HCT: 23.5 % — ABNORMAL LOW (ref 39.0–52.0)
Hemoglobin: 7.6 g/dL — ABNORMAL LOW (ref 13.0–17.0)
Immature Granulocytes: 1 %
Lymphocytes Relative: 3 %
Lymphs Abs: 0.6 10*3/uL — ABNORMAL LOW (ref 0.7–4.0)
MCH: 25.8 pg — ABNORMAL LOW (ref 26.0–34.0)
MCHC: 32.3 g/dL (ref 30.0–36.0)
MCV: 79.7 fL — ABNORMAL LOW (ref 80.0–100.0)
Monocytes Absolute: 1.1 10*3/uL — ABNORMAL HIGH (ref 0.1–1.0)
Monocytes Relative: 6 %
Neutro Abs: 16.3 10*3/uL — ABNORMAL HIGH (ref 1.7–7.7)
Neutrophils Relative %: 90 %
Platelets: 154 10*3/uL (ref 150–400)
RBC: 2.95 MIL/uL — ABNORMAL LOW (ref 4.22–5.81)
RDW: 19.9 % — ABNORMAL HIGH (ref 11.5–15.5)
WBC: 18.2 10*3/uL — ABNORMAL HIGH (ref 4.0–10.5)
nRBC: 0.1 % (ref 0.0–0.2)

## 2021-04-09 LAB — GLUCOSE, CAPILLARY
Glucose-Capillary: 108 mg/dL — ABNORMAL HIGH (ref 70–99)
Glucose-Capillary: 108 mg/dL — ABNORMAL HIGH (ref 70–99)
Glucose-Capillary: 127 mg/dL — ABNORMAL HIGH (ref 70–99)
Glucose-Capillary: 163 mg/dL — ABNORMAL HIGH (ref 70–99)
Glucose-Capillary: 209 mg/dL — ABNORMAL HIGH (ref 70–99)
Glucose-Capillary: 65 mg/dL — ABNORMAL LOW (ref 70–99)
Glucose-Capillary: 70 mg/dL (ref 70–99)
Glucose-Capillary: 97 mg/dL (ref 70–99)

## 2021-04-09 LAB — POCT I-STAT 7, (LYTES, BLD GAS, ICA,H+H)
Acid-Base Excess: 4 mmol/L — ABNORMAL HIGH (ref 0.0–2.0)
Bicarbonate: 28.4 mmol/L — ABNORMAL HIGH (ref 20.0–28.0)
Calcium, Ion: 1.24 mmol/L (ref 1.15–1.40)
HCT: 24 % — ABNORMAL LOW (ref 39.0–52.0)
Hemoglobin: 8.2 g/dL — ABNORMAL LOW (ref 13.0–17.0)
O2 Saturation: 96 %
Patient temperature: 98.2
Potassium: 3.6 mmol/L (ref 3.5–5.1)
Sodium: 133 mmol/L — ABNORMAL LOW (ref 135–145)
TCO2: 30 mmol/L (ref 22–32)
pCO2 arterial: 41.4 mmHg (ref 32.0–48.0)
pH, Arterial: 7.444 (ref 7.350–7.450)
pO2, Arterial: 76 mmHg — ABNORMAL LOW (ref 83.0–108.0)

## 2021-04-09 LAB — COOXEMETRY PANEL
Carboxyhemoglobin: 1.9 % — ABNORMAL HIGH (ref 0.5–1.5)
Methemoglobin: 0.9 % (ref 0.0–1.5)
O2 Saturation: 69.3 %
Total hemoglobin: 7.5 g/dL — ABNORMAL LOW (ref 12.0–16.0)

## 2021-04-09 LAB — SURGICAL PATHOLOGY

## 2021-04-09 LAB — PHOSPHORUS: Phosphorus: 3.4 mg/dL (ref 2.5–4.6)

## 2021-04-09 LAB — POTASSIUM: Potassium: 3.1 mmol/L — ABNORMAL LOW (ref 3.5–5.1)

## 2021-04-09 LAB — HEMOGLOBIN A1C
Hgb A1c MFr Bld: 6 % — ABNORMAL HIGH (ref 4.8–5.6)
Mean Plasma Glucose: 126 mg/dL

## 2021-04-09 LAB — MAGNESIUM: Magnesium: 1.9 mg/dL (ref 1.7–2.4)

## 2021-04-09 LAB — PROTIME-INR
INR: 1.4 — ABNORMAL HIGH (ref 0.8–1.2)
Prothrombin Time: 17.2 seconds — ABNORMAL HIGH (ref 11.4–15.2)

## 2021-04-09 MED ORDER — WARFARIN SODIUM 2.5 MG PO TABS
2.5000 mg | ORAL_TABLET | Freq: Once | ORAL | Status: AC
  Administered 2021-04-09: 16:00:00 2.5 mg via ORAL
  Filled 2021-04-09: qty 1

## 2021-04-09 MED ORDER — PROSOURCE PLUS PO LIQD
30.0000 mL | Freq: Three times a day (TID) | ORAL | Status: DC
  Administered 2021-04-09 – 2021-05-09 (×87): 30 mL via ORAL
  Filled 2021-04-09 (×84): qty 30

## 2021-04-09 MED ORDER — SODIUM CHLORIDE 0.9% FLUSH
10.0000 mL | Freq: Two times a day (BID) | INTRAVENOUS | Status: DC
Start: 2021-04-10 — End: 2021-04-22
  Administered 2021-04-10 – 2021-04-21 (×18): 10 mL

## 2021-04-09 MED ORDER — POTASSIUM CHLORIDE CRYS ER 20 MEQ PO TBCR
20.0000 meq | EXTENDED_RELEASE_TABLET | ORAL | Status: AC
  Administered 2021-04-09 – 2021-04-10 (×3): 20 meq via ORAL
  Filled 2021-04-09 (×5): qty 1

## 2021-04-09 MED ORDER — FUROSEMIDE 10 MG/ML IJ SOLN
40.0000 mg | Freq: Once | INTRAMUSCULAR | Status: AC
  Administered 2021-04-09: 08:00:00 40 mg via INTRAVENOUS
  Filled 2021-04-09: qty 4

## 2021-04-09 MED ORDER — SORBITOL 70 % SOLN
30.0000 mL | Freq: Once | Status: AC
Start: 2021-04-09 — End: 2021-04-09
  Administered 2021-04-09: 08:00:00 30 mL via ORAL
  Filled 2021-04-09: qty 30

## 2021-04-09 MED ORDER — ENSURE ENLIVE PO LIQD
237.0000 mL | Freq: Three times a day (TID) | ORAL | Status: DC
Start: 2021-04-09 — End: 2021-05-09
  Administered 2021-04-09 – 2021-05-07 (×48): 237 mL via ORAL

## 2021-04-09 MED ORDER — FUROSEMIDE 10 MG/ML IJ SOLN
8.0000 mg/h | INTRAVENOUS | Status: DC
  Administered 2021-04-09 – 2021-04-10 (×2): 8 mg/h via INTRAVENOUS
  Filled 2021-04-09 (×4): qty 20

## 2021-04-09 MED ORDER — SODIUM CHLORIDE 0.9% FLUSH: 10.0000 mL | INTRAVENOUS | Status: DC | PRN

## 2021-04-09 MED ORDER — MAGNESIUM SULFATE 2 GM/50ML IV SOLN
2.0000 g | Freq: Once | INTRAVENOUS | Status: AC
  Administered 2021-04-09: 11:00:00 2 g via INTRAVENOUS
  Filled 2021-04-09: qty 50

## 2021-04-09 MED ORDER — INSULIN DETEMIR 100 UNIT/ML ~~LOC~~ SOLN
15.0000 [IU] | Freq: Every day | SUBCUTANEOUS | Status: DC
  Administered 2021-04-09 – 2021-04-30 (×22): 15 [IU] via SUBCUTANEOUS
  Filled 2021-04-09 (×22): qty 0.15

## 2021-04-09 MED ORDER — POTASSIUM CHLORIDE CRYS ER 20 MEQ PO TBCR
20.0000 meq | EXTENDED_RELEASE_TABLET | ORAL | Status: AC
  Administered 2021-04-09 (×3): 20 meq via ORAL
  Filled 2021-04-09 (×3): qty 1

## 2021-04-09 MED FILL — Thrombin (Recombinant) For Soln 20000 Unit: CUTANEOUS | Qty: 1 | Status: AC

## 2021-04-09 NOTE — Plan of Care (Signed)
  Problem: Education: Goal: Knowledge of General Education information will improve Description: Including pain rating scale, medication(s)/side effects and non-pharmacologic comfort measures Outcome: Progressing   Problem: Health Behavior/Discharge Planning: Goal: Ability to manage health-related needs will improve Outcome: Progressing   Problem: Clinical Measurements: Goal: Ability to maintain clinical measurements within normal limits will improve Outcome: Progressing Goal: Will remain free from infection Outcome: Progressing Goal: Diagnostic test results will improve Outcome: Progressing Goal: Respiratory complications will improve Outcome: Progressing Goal: Cardiovascular complication will be avoided Outcome: Progressing   Problem: Activity: Goal: Risk for activity intolerance will decrease Outcome: Progressing   Problem: Nutrition: Goal: Adequate nutrition will be maintained Outcome: Progressing   Problem: Coping: Goal: Level of anxiety will decrease Outcome: Progressing   Problem: Elimination: Goal: Will not experience complications related to bowel motility Outcome: Progressing Goal: Will not experience complications related to urinary retention Outcome: Progressing   Problem: Pain Managment: Goal: General experience of comfort will improve Outcome: Progressing   Problem: Safety: Goal: Ability to remain free from injury will improve Outcome: Progressing   Problem: Skin Integrity: Goal: Risk for impaired skin integrity will decrease Outcome: Progressing   Problem: Education: Goal: Ability to demonstrate management of disease process will improve Outcome: Progressing Goal: Ability to verbalize understanding of medication therapies will improve Outcome: Progressing Goal: Individualized Educational Video(s) Outcome: Progressing   Problem: Activity: Goal: Capacity to carry out activities will improve Outcome: Progressing   Problem: Cardiac: Goal:  Ability to achieve and maintain adequate cardiopulmonary perfusion will improve Outcome: Progressing   Problem: Education: Goal: Knowledge of the prescribed therapeutic regimen will improve Outcome: Progressing   Problem: Activity: Goal: Risk for activity intolerance will decrease Outcome: Progressing   Problem: Cardiac: Goal: Ability to maintain an adequate cardiac output will improve Outcome: Progressing   Problem: Coping: Goal: Level of anxiety will decrease Outcome: Progressing   Problem: Fluid Volume: Goal: Risk for excess fluid volume will decrease Outcome: Progressing   Problem: Clinical Measurements: Goal: Ability to maintain clinical measurements within normal limits will improve Outcome: Progressing Goal: Will remain free from infection Outcome: Progressing   Problem: Respiratory: Goal: Will regain and/or maintain adequate ventilation Outcome: Progressing

## 2021-04-09 NOTE — Progress Notes (Signed)
CSW contacted patient's sister [name] to offer support and provide brief update from VAD Coordinator. [name] states she and her sister are in the hospital and plan to wait on the CVICU waiting area until after the surgery. CSW will inform VAD Coordinator for update and follow up post OR. CSW provided supportive intervention and will continue to follow throughout implant hospitalization. [name], [place], [place]

## 2021-04-09 NOTE — Progress Notes (Addendum)
LVAD Coordinator Rounding Note:  Admitted 03/07/21 due to sepsis. Dr. [name] consulted as his HF Cardiologist.   HM III LVAD implanted on 04/06/21 by Dr. Cyndia Bent under Destination Therapy criteria. Not a transplant candidate at this time due to recurrent infections.   Patient sitting up in chair at bedside. Reports he is "feeling better every day". He has been up and walked in room this am.   Vital signs: Temp: 101 HR: 101 ST [place]:  100/74 (86) O2 Sat: 98% on 4 L/[place] Wt:171.5>190>187.4>187 lbs     LVAD interrogation reveals:  Speed: 5500 Flow: 4.4 Power:  4.3w PI: 3.9  Alarms: none Events:  4 PI events on 04/08/21 Hematocrit: 26  Fixed speed: 5500 Low speed limit: 5200  Drive Line: Existing VAD dressing removed and site care performed using sterile technique. Drive line exit site cleaned with Chlora prep applicators x 2, allowed to dry, and gauze dressing with silver strip applied. Exit site unincorporated with suture intact, the velour is fully implanted at exit site. Small amount serous drainage, no redness, tenderness, foul odor or rash noted. Drive line anchor re-applied. Daily dressings per VAD Coordinator, Nurse Davonna Belling, or trained caregiver.    Labs:  LDH trend: 309>248>215  INR trend: 1.6>1.3>1.3>1.4  Anticoagulation Plan: -INR Goal: 2 - 2.5 -ASA Dose: 325 mg daily until INR therapeutic  Blood Products:   Intra-op: - 04/06/21>>4 PRBCs, 4 FFP, 1263 cc cell saver  Post-op: - 04/06/21>>1 unit Plts - 04/07/21>>1 unit PRBC  Device: N/A  Arrythmias: 03/22/21 - developed Afib w/ RVR, converted with IV amiodarone  Respiratory: extubated 04/07/21  Nitric Oxide: off 04/07/21  Gtts: - Epinephrine>>3 mcg/min - Milrinone>>0.125 mcg/kg/min - Levophed>>7 mcg/min - Amiodarone>>30 mg/hr  Patient/Family Teaching: Delivered HM III Patient Handbook and asked Mr. Brimley to complete reading prior to formal discharge teaching. Pt verbalized understanding of same.     Plan/Recommendations:  1. Call VAD Coordinator for any VAD equipment or drive line issues. 2.  Daily dressing changes per VAD Coordinator, Nurse Davonna Belling, or trained caregiver.  Zada Girt RN, VAD Coordinator 24/7 VAD pager: [phone]

## 2021-04-09 NOTE — Progress Notes (Signed)
Patient ID: Brandon [name], male   DOB: 1955-09-26, 65 y.o.   MRN: [id number] HeartMate 3 Rounding Note  Subjective:    Stable overnight. Walked in room twice. Feels well. No BM yet.  Milrinone 0.125, epi 3, NE 6  Co-0x 69%, CVP 12  -1390/24 hrs Wt unchanged at 17 lbs over preop.  LVAD INTERROGATION:  HeartMate IIl LVAD:  Flow 4.6 liters/min, speed 5500, power 4, PI 4.2.   Objective:    Vital Signs:   Temp:  [98.2 F (36.8 C)-98.5 F (36.9 C)] 98.2 F (36.8 C) (11/28 0330) Pulse Rate:  [34-120] 104 (11/28 0700) Resp:  [9-30] 22 (11/28 0700) BP: (76-116)/(63-100) 89/71 (11/28 0700) SpO2:  [76 %-99 %] 92 % (11/28 0700) Arterial Line BP: (59-113)/(43-86) 89/67 (11/28 0700) Weight:  [84.9 kg] 84.9 kg (11/28 0620) Last BM Date: 04/05/21 Mean arterial Pressure 80's  Intake/Output:   Intake/Output Summary (Last 24 hours) at 04/09/2021 0720 Last data filed at 04/09/2021 0630 Gross per 24 hour  Intake 2299.08 ml  Output 3690 ml  Net -1390.92 ml     Physical Exam: General:  Well appearing. No resp difficulty HEENT: normal Cor: Normal heart sounds with LVAD hum present. Lungs: decreased BS over lower lobes Abdomen: soft, nontender, nondistended, Good bowel sounds. Extremities: mild edema in legs Neuro: alert & orientedx3, cranial nerves grossly intact. moves all 4 extremities w/o difficulty. Affect pleasant  Telemetry: sinus 90's  Labs: Basic Metabolic Panel: Recent Labs  Lab 04/06/21 2120 04/06/21 2134 04/07/21 0500 04/07/21 0525 04/07/21 1557 04/07/21 1559 04/07/21 2257 04/08/21 0359 04/08/21 0528 04/08/21 1508 04/09/21 0359 04/09/21 0409  NA 134*   < > 132*   < > 132*   < > 134* 130* 132* 130* 132* 133*  K 2.8*   < > 3.0*   < > 3.6   < > 3.6 3.8 3.7 3.4* 3.6 3.6  CL 96*  --  97*  --  97*  --  99 95*  --  95* 97*  --   CO2 29  --  28  --  28  --  28 26  --  27 28  --   GLUCOSE 130*  --  109*  --  156*  --  107* 65*  --  103* 102*  --   BUN 27*  --  27*  --   26*  --  26* 24*  --  23 18  --   CREATININE 0.96  --  0.79  --  0.83  --  0.78 0.69  --  0.73 0.60*  --   CALCIUM 8.3*  --  8.2*  --  8.3*  --  8.3* 8.4*  --  8.5* 8.6*  --   MG 2.6*  --  2.2  --  2.1  --   --  2.0  --   --  1.9  --   PHOS  --   --  4.5  --   --   --   --  3.3  --   --  3.4  --    < > = values in this interval not displayed.    Liver Function Tests: Recent Labs  Lab 04/04/21 0502 04/07/21 0500 04/08/21 0359 04/09/21 0359  AST 21 86* 53* 34  ALT 26 50* 37 29  ALKPHOS 96 51 68 78  BILITOT 0.5 4.4* 2.6* 1.6*  PROT 7.2 5.2* 5.6* 5.6*  ALBUMIN 2.7* 2.9* 2.7* 2.5*   No results for input(s):  LIPASE, AMYLASE in the last 168 hours. No results for input(s): AMMONIA in the last 168 hours.  CBC: Recent Labs  Lab 04/07/21 0006 04/07/21 0308 04/07/21 0500 04/07/21 0525 04/07/21 1557 04/07/21 1559 04/08/21 0359 04/08/21 0528 04/09/21 0359 04/09/21 0409  WBC 28.4*  --  26.3*  --  27.0*  --  24.9*  --  18.2*  --   NEUTROABS  --   --  23.4*  --   --   --  22.2*  --  16.3*  --   HGB 7.7*   < > 8.9*   < > 8.2* 8.8* 8.4* 8.2* 7.6* 8.2*  HCT 23.2*   < > 26.2*   < > 25.9* 26.0* 24.6* 24.0* 23.5* 24.0*  MCV 78.6*  --  78.4*  --  79.9*  --  79.6*  --  79.7*  --   PLT 144*  --  163  --  153  --  141*  --  154  --    < > = values in this interval not displayed.    INR: Recent Labs  Lab 04/06/21 1551 04/07/21 0500 04/08/21 0359 04/09/21 0359  INR 1.6* 1.3* 1.3* 1.4*    Other results: EKG:   Imaging: DG CHEST PORT 1 VIEW  Result Date: 04/08/2021 CLINICAL DATA:  Follow-up right pneumothorax. Congestive heart failure. Left ventricular assist device. EXAM: PORTABLE CHEST 1 VIEW COMPARISON:  04/07/2021 FINDINGS: Endotracheal tube has been removed. Other support apparatus remains in appropriate position including bilateral chest tubes. Previously seen tiny right apical pneumothorax is no longer visualized. Stable mild cardiomegaly. Diffuse bilateral airspace disease  with bibasilar predominance shows no significant change. Increased atelectasis or consolidation is noted in the left retrocardiac lung base. Previous aortic valve replacement noted. IMPRESSION: atelectasis or consolidation in left retrocardiac lung base. No significant change in diffuse bilateral airspace disease. Previously seen tiny right apical pneumothorax is no longer visualized. Electronically Signed   By: Marlaine Hind M.D.   On: 04/08/2021 08:15   [place] EKG SITE RITE  Result Date: 04/09/2021 If Site Rite image not attached, placement could not be confirmed due to current cardiac rhythm.    Medications:     Scheduled Medications:  (feeding supplement) PROSource Plus  30 mL Oral TID PC & HS   sodium chloride   Intravenous Once   acetaminophen  1,000 mg Oral Q6H   Or   acetaminophen (TYLENOL) oral liquid 160 mg/5 mL  1,000 mg Per Tube Q6H   aspirin EC  325 mg Oral Daily   Or   aspirin  324 mg Per Tube Daily   Or   aspirin  300 mg Rectal Daily   bisacodyl  10 mg Oral Daily   Or   bisacodyl  10 mg Rectal Daily   Chlorhexidine Gluconate Cloth  6 each Topical Daily   digoxin  0.125 mg Oral Daily   docusate sodium  200 mg Oral Daily   ezetimibe  10 mg Oral Daily   feeding supplement  237 mL Oral TID BM   folic acid  1 mg Oral Daily   furosemide  40 mg Intravenous Once   gabapentin  200 mg Oral TID   insulin aspart  0-15 Units Subcutaneous Q4H   insulin detemir  20 Units Subcutaneous Daily   mouth rinse  15 mL Mouth Rinse BID   metoCLOPramide (REGLAN) injection  10 mg Intravenous Q6H   midodrine  10 mg Oral TID WC   multivitamin with minerals  1 tablet Oral Daily   pantoprazole  40 mg Oral Daily   potassium chloride  20 mEq Oral Q4H   sertraline  100 mg Oral Daily   sodium chloride flush  10-40 mL Intracatheter Q12H   sodium chloride flush  3 mL Intravenous Q12H   sorbitol  30 mL Oral Once   tamsulosin  0.4 mg Oral Daily   vitamin B-12  100 mcg Oral Daily   Warfarin -  Pharmacist Dosing Inpatient   Does not apply q1600    Infusions:  sodium chloride Stopped (04/07/21 0943)   sodium chloride     sodium chloride Stopped (04/08/21 1933)   amiodarone 30 mg/hr (04/09/21 0428)   epinephrine 3 mcg/min (04/09/21 0428)   furosemide (LASIX) 200 mg in dextrose 5% 100 mL (2mg /mL) infusion     lactated ringers 10 mL/hr at 04/09/21 0428   lactated ringers 20 mL/hr at 04/06/21 2130   milrinone 0.125 mcg/kg/min (04/09/21 0428)   norepinephrine (LEVOPHED) Adult infusion 6 mcg/min (04/09/21 0428)    PRN Medications: sodium chloride, diphenhydrAMINE, melatonin, morphine injection, ondansetron (ZOFRAN) IV, oxyCODONE, polyvinyl alcohol, sodium chloride flush, sodium chloride flush, traMADol   Assessment:    POD 3 HM 3 LVAD and AVR with bioprosthetic valve for acute on chronic systolic heart failure with EF 20-25% and cardiogenic shock requiring milrinone. He had a bicupid AV with moderate AS and AI. Intraop severe vasoplegia requiring multiple vasopressors and methylene blue. RV function looked good. PAF with RVR maintaing sinus on IV amio. 3.   Anklosing spondylitis and UE peripheral neuropathy related to that. 4.   Severe protein-calorie malnutrition with prealbumin 13 despite good caloric intact and protein supplements. 5.   Strep bacteremia 11/2020 with ICD extraction 12/04/20 and IV antibiotics until 01/15/21. BC negative 01/30/21 and 03/07/21 when he presented again with signs of sepsis. CT chest was concerning for pneumonia and he was treated with Maxepime and Lucianne Lei, switched to Augmentin and stopped 03/19/2021. 6.   Hyponatremia 7.   Hypokalemia 8.   Iron deficiency anemia and acute postop blood loss anemia. 9.   Noted to have recent hemorrhagic pericarditis with heart encased in recent organized thrombus on Eliquis preop before stopping for surgery. 10.  Large bilateral pleural effusions drained at the time of surgery, related to heart failure.  Plan/Discussion:     He is hemodynamically stable on current inotrope/vasopressors with good Co-ox. Wean NE as tolerated. He looks great overall.  Volume excess: planning lasix drip per DB. Continue KCl replacement.  Pocket drain to stay in today.  Coumadin per pharmacy slowly.  Resume protein supplement and Ensure.   IS, OOB, ambulate.  Sorbitol today.    I reviewed the LVAD parameters from today, and compared the results to the patient's prior recorded data.  No programming changes were made.  The LVAD is functioning within specified parameters.  The patient performs LVAD self-test daily.  LVAD interrogation was negative for any significant power changes, alarms or PI events/speed drops.  LVAD equipment check completed and is in good working order.  Back-up equipment present.   LVAD education done on emergency procedures and precautions and reviewed exit site care.  Length of Stay: [place] 04/09/2021, 7:20 AM

## 2021-04-09 NOTE — Progress Notes (Signed)
Pt is for PICC placement and cxr showed with rt chest device but per Dr. [name], it is ok to use the rt arm for picc placement because the device is under the skin and not on the vessel. Lt arm restricted, LVAD  was placed.

## 2021-04-09 NOTE — Progress Notes (Signed)
ANTICOAGULATION CONSULT NOTE   Pharmacy Consult for warfarin Indication:  s/p VAD (HM3) , afib, hx DVT  No Active Allergies  Patient Measurements: Height: 6' (182.9 cm) Weight: 84.9 kg (187 lb 2.7 oz) IBW/kg (Calculated) : 77.6   Vital Signs: Temp: 98.1 F (36.7 C) (11/28 0751) Temp Source: Oral (11/28 0751) BP: 102/82 (11/28 1000) Pulse Rate: 101 (11/28 0900)  Labs: Recent Labs    04/06/21 1551 04/06/21 2120 04/07/21 0500 04/07/21 0525 04/07/21 1557 04/07/21 1559 04/08/21 0359 04/08/21 0528 04/08/21 1508 04/09/21 0359 04/09/21 0409  HGB 8.8*   < > 8.9*   < > 8.2*   < > 8.4* 8.2*  --  7.6* 8.2*  HCT 27.8*   < > 26.2*   < > 25.9*   < > 24.6* 24.0*  --  23.5* 24.0*  PLT 110*   < > 163  --  153  --  141*  --   --  154  --   APTT 46*  --   --   --   --   --   --   --   --   --   --   LABPROT 19.3*  --  16.3*  --   --   --  16.5*  --   --  17.2*  --   INR 1.6*  --  1.3*  --   --   --  1.3*  --   --  1.4*  --   CREATININE 0.97   < > 0.79  --  0.83   < > 0.69  --  0.73 0.60*  --    < > = values in this interval not displayed.     Estimated Creatinine Clearance: 98.3 mL/min (A) (by C-G formula based on SCr of 0.6 mg/dL (L)).   Medical History: Past Medical History:  Diagnosis Date   AICD (automatic cardioverter/defibrillator) present 08/31/2019   Ankylosing spondylitis ([place])    Arthritis    BENIGN PROSTATIC HYPERTROPHY 06/07/2008   CHF (congestive heart failure) ([place])    COLONIC POLYPS, HX OF 06/07/2008   Depression    H/O hiatal hernia    Heart failure ([place])    HYPERLIPIDEMIA 06/07/2008   HYPERTENSION 06/07/2008   Myocardial infarction [facility]) 2005   NSTEMI, s/p LAD stent   NEPHROLITHIASIS, HX OF 06/07/2008   STEMI (ST elevation myocardial infarction) ([place]) 04/27/2019     Assessment: 65 yo M s/p HM 3 and AVR on 04/06/21. PTA patient was on apixiban for a DVT. Patient had complicated post-op course with vasoplegia requiring methylene blue and severe hemorrhagic  pericarditis at the time of surgery. Per Dr. Cyndia Bent, if INR jumps up quickly he is high risk to bleed around his heart. Of note the patient is also on amiodarone and nutritional status not great at baseline.  Given above dosing Coumadin conservatively.  No signs of bleeding per RN.  Hgb low but stable today.  Goal of Therapy:  Warfarin INR 2.0-2.5 Monitor platelets by anticoagulation protocol: Yes   Plan:  Warfarin 2.5 mg PO x1 again today. Continue aspirin 325 mg daily until INR > 1.8 then drop to 81 mg x 30 days s/p HM3 Daily INR and CBC  [place] Crane, Roylene Reason, [facility] [place] Clinical Pharmacist  04/09/2021 10:41 AM   [facility] pharmacy phone numbers are listed on [email or web address]

## 2021-04-09 NOTE — Progress Notes (Signed)
Peripherally Inserted Central Catheter Placement  The IV Nurse has discussed with the patient and/or persons authorized to consent for the patient, the purpose of this procedure and the potential benefits and risks involved with this procedure.  The benefits include less needle sticks, lab draws from the catheter, and the patient may be discharged home with the catheter. Risks include, but not limited to, infection, bleeding, blood clot (thrombus formation), and puncture of an artery; nerve damage and irregular heartbeat and possibility to perform a PICC exchange if needed/ordered by physician.  Alternatives to this procedure were also discussed.  Bard Power PICC patient education guide, fact sheet on infection prevention and patient information card has been provided to patient /or left at bedside.    PICC Placement Documentation  PICC Triple Lumen 04/09/21 PICC Right Brachial 43 cm 0 cm (Active)  Indication for Insertion or Continuance of Line Vasoactive infusions 04/09/21 2256  Exposed Catheter (cm) 0 cm 04/09/21 2256  Site Assessment Clean;Dry;Intact 04/09/21 2256  Lumen #1 Status Flushed;Blood return noted;Saline locked 04/09/21 2256  Lumen #2 Status Flushed;Blood return noted;Saline locked 04/09/21 2256  Lumen #3 Status Flushed;Blood return noted;Saline locked 04/09/21 2256  Dressing Type Transparent 04/09/21 2256  Dressing Status Clean;Dry;Intact 04/09/21 2256  Antimicrobial disc in place? Yes 04/09/21 2256  Safety Lock Not Applicable 85/46/27 0350  Line Care Connections checked and tightened 04/09/21 2256  Dressing Intervention New dressing 04/09/21 2256  Dressing Change Due 04/16/21 04/09/21 2256       Ioan Landini, Cathlyn Parsons 04/09/2021, 11:36 PM

## 2021-04-09 NOTE — Progress Notes (Signed)
CSW met at bedside with patient and sister mary. Patient states he is feeling good and reported "already feel like I can breathe better". Patient spoke about how grateful he was that he was able to meet with a VAD patient prior to implant. He shared that seeing a patient before surgery gave him hope for improved health. Patient's sister Stanton Kidney asked good questions about training and ongoing care of an LVAD. Patient appears in good spirits and highly motivated for recovery and improved health. CSW continues to follow throughout implant hospitalization. [name], [place], [place]

## 2021-04-09 NOTE — Progress Notes (Signed)
Hypoglycemic Event  CBG: 53  Treatment: 8 oz juice/soda  Symptoms: None  Follow-up CBG: Time:0015 CBG Result:70  Possible Reasons for Event: Inadequate meal intake  Comments/MD notified: hypoglycemia protocol, followed by additional carb intake    Raphael Gibney

## 2021-04-09 NOTE — Progress Notes (Addendum)
Patient ID: Brandon [name], male   DOB: 11-27-1955, 65 y.o.   MRN: [id number]     Advanced Heart Failure Rounding Note  PCP-Cardiologist: Brandon Ruths, MD   Subjective:    Admitted with sepsis. Started on cefepime + vanc, now off abx (ID following). Afebrile.  10/29 Co-ox 48%, started on milrinone 0.25.  10/30 IV Antibiotics stopped. Blood cultures no growth for 5 days.  10/31 started on Augmentin for PNA.  11/1 Milrinone off 11/2 Milrinone added back. Midodrine increased to 10 mg TID 11/10 Developed Afib w/ RVR, converted with IV amio 11/14 Milrinone increased to 0.25 mcg. Diuresed with IV lasix  11/25 Underwent HM-3 VAD placement and bioprosthetic AVR. Had persistent hypotension in OR requiring methylene blue and high dose pressors.  11/26 Extubated  Sitting up in chair eating breakfast.   On Epi 3, NE 6 and milrinone 0.125. Co-ox 69% Hgb 7.6. Diuresing on IV lasix but weight unchanged overnight  Chest sore. Denies SOB, orthopnea or PND. No BM yet. Passing gas.   LVAD Interrogation HM 3: Speed: 5500 Flow: 4.4 PI: 4.2 Power: 4.0. VAD interrogated personally. Parameters stable.     Objective:   Weight Range: 84.9 kg Body mass index is 25.38 kg/m.   Vital Signs:   Temp:  [98.2 F (36.8 C)-98.5 F (36.9 C)] 98.2 F (36.8 C) (11/28 0330) Pulse Rate:  [34-120] 98 (11/28 0630) Resp:  [9-30] 17 (11/28 0630) BP: (76-116)/(63-100) 100/77 (11/28 0620) SpO2:  [76 %-99 %] 95 % (11/28 0630) Arterial Line BP: (59-113)/(43-86) 97/74 (11/28 0630) Weight:  [84.9 kg] 84.9 kg (11/28 0620) Last BM Date: 04/05/21  Weight change: Filed Weights   04/07/21 0500 04/08/21 0500 04/09/21 0620  Weight: 86.2 kg 85 kg 84.9 kg    Intake/Output:   Intake/Output Summary (Last 24 hours) at 04/09/2021 0647 Last data filed at 04/09/2021 0428 Gross per 24 hour  Intake 2299.08 ml  Output 3510 ml  Net -1210.92 ml      Physical Exam   General:  NAD.  HEENT: normal  Neck: supple. JVP to  jaw. LIJ line Cor: sternal wound ok LVAD hum. + CT Lungs: Clear. Decreased at bases.  Abdomen: soft, nontender, non-distended. No hepatosplenomegaly. No bruits or masses. Good bowel sounds. Driveline site clean. Anchor in place.  Extremities: no cyanosis, clubbing, rash. Warm no edema  Neuro: alert & oriented x 3. No focal deficits. Moves all 4 without problem    Telemetry   Sinus 90-100 Personally reviewed   Labs    CBC Recent Labs    04/08/21 0359 04/08/21 0528 04/09/21 0359 04/09/21 0409  WBC 24.9*  --  18.2*  --   NEUTROABS 22.2*  --  16.3*  --   HGB 8.4*   < > 7.6* 8.2*  HCT 24.6*   < > 23.5* 24.0*  MCV 79.6*  --  79.7*  --   PLT 141*  --  154  --    < > = values in this interval not displayed.    Basic Metabolic Panel Recent Labs    04/08/21 0359 04/08/21 0528 04/08/21 1508 04/09/21 0359 04/09/21 0409  NA 130*   < > 130* 132* 133*  K 3.8   < > 3.4* 3.6 3.6  CL 95*  --  95* 97*  --   CO2 26  --  27 28  --   GLUCOSE 65*  --  103* 102*  --   BUN 24*  --  23 18  --  CREATININE 0.69  --  0.73 0.60*  --   CALCIUM 8.4*  --  8.5* 8.6*  --   MG 2.0  --   --  1.9  --   PHOS 3.3  --   --  3.4  --    < > = values in this interval not displayed.    Liver Function Tests Recent Labs    04/08/21 0359 04/09/21 0359  AST 53* 34  ALT 37 29  ALKPHOS 68 78  BILITOT 2.6* 1.6*  PROT 5.6* 5.6*  ALBUMIN 2.7* 2.5*     No results for input(s): LIPASE, AMYLASE in the last 72 hours. Cardiac Enzymes No results for input(s): CKTOTAL, CKMB, CKMBINDEX, TROPONINI in the last 72 hours.  BNP: BNP (last 3 results) Recent Labs    01/08/21 0731 01/18/21 1629 04/07/21 0500  BNP 734.2* 607.9* 663.2*     ProBNP (last 3 results) No results for input(s): PROBNP in the last 8760 hours.    D-Dimer No results for input(s): DDIMER in the last 72 hours. Hemoglobin A1C Recent Labs    04/07/21 0745  HGBA1C 6.0*   Fasting Lipid Panel No results for input(s): CHOL,  HDL, LDLCALC, TRIG, CHOLHDL, LDLDIRECT in the last 72 hours. Thyroid Function Tests No results for input(s): TSH, T4TOTAL, T3FREE, THYROIDAB in the last 72 hours.  Invalid input(s): FREET3  Other results:   Imaging    No results found.   Medications:     Scheduled Medications:  sodium chloride   Intravenous Once   acetaminophen  1,000 mg Oral Q6H   Or   acetaminophen (TYLENOL) oral liquid 160 mg/5 mL  1,000 mg Per Tube Q6H   aspirin EC  325 mg Oral Daily   Or   aspirin  324 mg Per Tube Daily   Or   aspirin  300 mg Rectal Daily   bisacodyl  10 mg Oral Daily   Or   bisacodyl  10 mg Rectal Daily   Chlorhexidine Gluconate Cloth  6 each Topical Daily   digoxin  0.125 mg Oral Daily   docusate sodium  200 mg Oral Daily   ezetimibe  10 mg Oral Daily   folic acid  1 mg Oral Daily   furosemide  40 mg Intravenous BID   gabapentin  200 mg Oral TID   insulin aspart  0-15 Units Subcutaneous Q4H   insulin detemir  20 Units Subcutaneous Daily   mouth rinse  15 mL Mouth Rinse BID   metoCLOPramide (REGLAN) injection  10 mg Intravenous Q6H   midodrine  10 mg Oral TID WC   multivitamin with minerals  1 tablet Oral Daily   pantoprazole  40 mg Oral Daily   potassium chloride  20 mEq Oral Q4H   sertraline  100 mg Oral Daily   sodium chloride flush  10-40 mL Intracatheter Q12H   sodium chloride flush  3 mL Intravenous Q12H   tamsulosin  0.4 mg Oral Daily   vitamin B-12  100 mcg Oral Daily   Warfarin - Pharmacist Dosing Inpatient   Does not apply q1600    Infusions:  sodium chloride Stopped (04/07/21 0943)   sodium chloride     sodium chloride Stopped (04/08/21 1933)   amiodarone 30 mg/hr (04/09/21 0428)   epinephrine 3 mcg/min (04/09/21 0428)   lactated ringers 10 mL/hr at 04/09/21 0428   lactated ringers 20 mL/hr at 04/06/21 2130   milrinone 0.125 mcg/kg/min (04/09/21 0428)   norepinephrine (LEVOPHED) Adult infusion 6  mcg/min (04/09/21 0428)     PRN Medications: sodium  chloride, diphenhydrAMINE, melatonin, morphine injection, ondansetron (ZOFRAN) IV, oxyCODONE, polyvinyl alcohol, sodium chloride flush, sodium chloride flush, traMADol    Patient Profile   [name] is a 65 year old with history of smoker, CAD s/p previous MI, HTN, HL, chronic HFeEF, sepsis 11/2020 bacteremia.  Admitted with sepsis. Bld Cx - NGTD    Assessment/Plan   1. Acute on chronic systolic HFr EF -> cardiogenic shock - Echo 05/19/20 EF 20-25% RV mildly HK. moderate AS  Mean gradient 13 AVA 1.2 cm2 DI 0.30 - Persistent NYHA IV. Admitted with shock - s/p HM-III VAD + bioprosthetic AVR on 11/25 - RV looked good on TEE in OR - MAP 70s-80s this morning, now on milrinone 0.125, NE 6, epi 3.  Continue midodrine 10 tid and wean NE today as tolerated - CVP 9-10, weight up 18 pounds  from pre-op.  Start lasix gtt at 8 - Continue digoxin.  - Place Ted hose and PICC  2. HM-3 LVAD implant - POD #3 - Plan as above - transfuse to keep hgb > 8.0 - VAD interrogated personally. Parameters stable. - Speed increased to 5500 on 11/25.  - ASA 325 until INR 1.8. INR 1.4 today - Continue warfarin. Increase slowly in setting of recent hemorrhagic pericarditis    3. Acute Hypoxic Respiratory Failure  - Extubated 11/26, stable on 4L [place].    4. 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 CAD  s/p anterior STEMI (12/20). LHC showed chronically occluded RCA (with L>>R collaterals) and thrombotic occlusion of proximal LAD. Underwent PCI of LAD. - Continue atorvastatin 80.   6. Iron-deficiency anemia & ABLA - Rec'd Feraheme 11/8 and 11/13  - Keep hgb > 7.5 (7.6 today).   7. Hypokalemia - Better today at 3.6 continue repletion with diuresis.   8. Severe protein-calorie malnutrition - prealbumin 11>15> 13 despite adequate caloric intake on calorie count - Continue aggressive nutritional support  9.  Paroxysmal Atrial Fibrillation w/ RVR - new, developed 11/10. Now back in NSR   - Continue IV amio  10. Hyponatremia - Na 132 currently, FW restrict.   11. Sepsis - H/o strep bacteremia 11/2020.  ICD extracted 12/04/20. Barostim was not removed as it is extravascular.  - Received IV antibiotics until 01/15/21. Blood cultures negative 01/30/21..  - 10/26 -Blood CX x2  Negative  - Ct chest concerning for pneumonia. Treated w/ cefepime/vancomycin -> Augmentin (stop 11/7) - Now afebrile, WBCs 25 -> 18   12. Hemorrhagic Pericarditis - Fibrin clot encasing heart at time of surgery.  Will need to be careful with anticoagulation (go slowly).   CRITICAL CARE Performed by: Glori Bickers  Total critical care time: 35 minutes  Critical care time was exclusive of separately billable procedures and treating other patients.  Critical care was necessary to treat or prevent imminent or life-threatening deterioration.  Critical care was time spent personally by me (independent of midlevel providers or residents) on the following activities: development of treatment plan with patient and/or surrogate as well as nursing, discussions with consultants, evaluation of patient's response to treatment, examination of patient, obtaining history from patient or surrogate, ordering and performing treatments and interventions, ordering and review of laboratory studies, ordering and review of radiographic studies, pulse oximetry and re-evaluation of patient's condition.  Length of Stay: [place], MD  04/09/2021,  6:47 AM  Advanced Heart Failure Team Pager [phone] (M-F; 7a - 5p)  Please contact [facility] for night-coverage after hours (5p -7a ) and weekends on [email or web address]

## 2021-04-09 NOTE — Progress Notes (Signed)
Physical Therapy Treatment Patient Details Name: Brandon [name] MRN: [id number] DOB: [birth year] Today's Date: 04/09/2021   History of Present Illness Pt is a 65 y.o. male admitted 03/07/21 with acute on chronic hypoxemic respiratory failure, cardiogenic shock. CT scan which shows bronchopneumonia and no indication of ILD. Pt undergoing workup for VAD. LVAD placed 04/06/21.  Other PMH includes CHF, ankylosing spondylitis, bacteremia and  possible endocarditis s/p ICD removal, HTN, depression, arthritis.    PT Comments    Pt making excellent progress with mobility. Expect continued progress toward return to independence.    Recommendations for follow up therapy are one component of a multi-disciplinary discharge planning process, led by the attending physician.  Recommendations may be updated based on patient status, additional functional criteria and insurance authorization.  Follow Up Recommendations  No PT follow up     Assistance Recommended at Discharge PRN  Equipment Recommendations  None recommended by PT (has rollator at home if needed)    Recommendations for Other Services       Precautions / Restrictions Precautions Precautions: Fall;Sternal;Other (comment) Precaution Comments: LVAD; chest tube     Mobility  Bed Mobility Overal bed mobility: Needs Assistance Bed Mobility: Supine to Sit     Supine to sit: Min assist;+2 for safety/equipment     General bed mobility comments: Assist to bring legs off of bed and elevate trunk into sitting.    Transfers Overall transfer level: Needs assistance Equipment used:  Ethelene Hal) Transfers: Sit to/from Stand Sit to Stand: Min assist;+2 safety/equipment;+2 physical assistance           General transfer comment: Assist to bring hips up and to stabilize. Pt used hands to knees    Ambulation/Gait Ambulation/Gait assistance: Min assist;+2 safety/equipment Gait Distance (Feet): 180 Feet Assistive device: Ethelene Hal Gait Pattern/deviations: Step-through pattern;Decreased stride length;Trunk flexed Gait velocity: Decreased Gait velocity interpretation: <1.31 ft/sec, indicative of household ambulator   General Gait Details: Assist for balance and support. Second person to follow with chair but pt able to make it all the way back to his room   Stairs             Wheelchair Mobility    Modified Rankin (Stroke Patients Only)       Balance Overall balance assessment: Needs assistance Sitting-balance support: Feet supported;No upper extremity supported Sitting balance-Leahy Scale: Fair     Standing balance support: Bilateral upper extremity supported;Reliant on assistive device for balance Standing balance-Leahy Scale: Poor Standing balance comment: Eva walker and min assist for static standing                            Cognition Arousal/Alertness: Awake/alert Behavior During Therapy: WFL for tasks assessed/performed Overall Cognitive Status: Within Functional Limits for tasks assessed                                          Exercises      General Comments General comments (skin integrity, edema, etc.): Pt on 4L O2 with VSS      Pertinent Vitals/Pain Pain Assessment: Faces Faces Pain Scale: Hurts a little bit Pain Location: chest Pain Descriptors / Indicators: Guarding Pain Intervention(s): Limited activity within patient's tolerance    Home Living  Prior Function            PT Goals (current goals can now be found in the care plan section) Acute Rehab PT Goals Patient Stated Goal: return to walking without a device Progress towards PT goals: Progressing toward goals    Frequency    Min 3X/week      PT Plan Current plan remains appropriate;Frequency needs to be updated    Co-evaluation              AM-PAC PT "6 Clicks" Mobility   Outcome Measure    Help needed moving from lying  on your back to sitting on the side of a flat bed without using bedrails?: A Little Help needed moving to and from a bed to a chair (including a wheelchair)?: A Little Help needed standing up from a chair using your arms (e.g., wheelchair or bedside chair)?: A Little Help needed to walk in hospital room?: A Little Help needed climbing 3-5 steps with a railing? : A Little 6 Click Score: 15    End of Session Equipment Utilized During Treatment: Oxygen Activity Tolerance: Patient tolerated treatment well Patient left: in chair;with call bell/phone within reach;with nursing/sitter in room Nurse Communication: Mobility status PT Visit Diagnosis: Difficulty in walking, not elsewhere classified (R26.2);Muscle weakness (generalized) (M62.81)     Time: 5681-2751 PT Time Calculation (min) (ACUTE ONLY): 35 min  Charges:  $Gait Training: 23-37 mins                     [place] Pager [phone] Office [place] 04/09/2021, 5:05 PM

## 2021-04-10 DIAGNOSIS — I2510 Atherosclerotic heart disease of native coronary artery without angina pectoris: Secondary | ICD-10-CM | POA: Diagnosis not present

## 2021-04-10 DIAGNOSIS — I5022 Chronic systolic (congestive) heart failure: Secondary | ICD-10-CM | POA: Diagnosis not present

## 2021-04-10 DIAGNOSIS — Z95811 Presence of heart assist device: Secondary | ICD-10-CM

## 2021-04-10 DIAGNOSIS — I358 Other nonrheumatic aortic valve disorders: Secondary | ICD-10-CM

## 2021-04-10 DIAGNOSIS — J9621 Acute and chronic respiratory failure with hypoxia: Secondary | ICD-10-CM | POA: Diagnosis not present

## 2021-04-10 DIAGNOSIS — B955 Unspecified streptococcus as the cause of diseases classified elsewhere: Secondary | ICD-10-CM

## 2021-04-10 DIAGNOSIS — M456 Ankylosing spondylitis lumbar region: Secondary | ICD-10-CM | POA: Diagnosis not present

## 2021-04-10 LAB — TYPE AND SCREEN
ABO/RH(D): O NEG
Antibody Screen: NEGATIVE
Unit division: 0
Unit division: 0
Unit division: 0
Unit division: 0
Unit division: 0
Unit division: 0
Unit division: 0
Unit division: 0

## 2021-04-10 LAB — BASIC METABOLIC PANEL
Anion gap: 11 (ref 5–15)
Anion gap: 8 (ref 5–15)
BUN: 15 mg/dL (ref 8–23)
BUN: 18 mg/dL (ref 8–23)
CO2: 29 mmol/L (ref 22–32)
CO2: 32 mmol/L (ref 22–32)
Calcium: 8.3 mg/dL — ABNORMAL LOW (ref 8.9–10.3)
Calcium: 8 mg/dL — ABNORMAL LOW (ref 8.9–10.3)
Chloride: 92 mmol/L — ABNORMAL LOW (ref 98–111)
Chloride: 92 mmol/L — ABNORMAL LOW (ref 98–111)
Creatinine, Ser: 0.58 mg/dL — ABNORMAL LOW (ref 0.61–1.24)
Creatinine, Ser: 0.59 mg/dL — ABNORMAL LOW (ref 0.61–1.24)
GFR, Estimated: >60 mL/min (ref >60)
GFR, Estimated: >60 mL/min (ref >60)
Glucose, Bld: 136 mg/dL — ABNORMAL HIGH (ref 70–99)
Glucose, Bld: 187 mg/dL — ABNORMAL HIGH (ref 70–99)
Potassium: 3.2 mmol/L — ABNORMAL LOW (ref 3.5–5.1)
Potassium: 3.8 mmol/L (ref 3.5–5.1)
Sodium: 132 mmol/L — ABNORMAL LOW (ref 135–145)
Sodium: 132 mmol/L — ABNORMAL LOW (ref 135–145)

## 2021-04-10 LAB — BPAM RBC
Blood Product Expiration Date: 202212012359
Blood Product Expiration Date: 202212012359
Blood Product Expiration Date: 202212032359
Blood Product Expiration Date: 202212062359
Blood Product Expiration Date: 202212062359
Blood Product Expiration Date: 202212062359
Blood Product Expiration Date: 202212062359
Blood Product Expiration Date: 202212072359
ISSUE DATE / TIME: 202211250801
ISSUE DATE / TIME: 202211250801
ISSUE DATE / TIME: 202211250801
ISSUE DATE / TIME: 202211250801
ISSUE DATE / TIME: 202211251136
ISSUE DATE / TIME: 202211260124
Unit Type and Rh: 9500
Unit Type and Rh: 9500
Unit Type and Rh: 9500
Unit Type and Rh: 9500
Unit Type and Rh: 9500
Unit Type and Rh: 9500
Unit Type and Rh: 9500
Unit Type and Rh: 9500

## 2021-04-10 LAB — POCT I-STAT 7, (LYTES, BLD GAS, ICA,H+H)
Acid-Base Excess: 11 mmol/L — ABNORMAL HIGH (ref 0.0–2.0)
Bicarbonate: 35.1 mmol/L — ABNORMAL HIGH (ref 20.0–28.0)
Calcium, Ion: 1.17 mmol/L (ref 1.15–1.40)
HCT: 25 % — ABNORMAL LOW (ref 39.0–52.0)
Hemoglobin: 8.5 g/dL — ABNORMAL LOW (ref 13.0–17.0)
O2 Saturation: 96 %
Patient temperature: 98.3
Potassium: 2.9 mmol/L — ABNORMAL LOW (ref 3.5–5.1)
Sodium: 135 mmol/L (ref 135–145)
TCO2: 36 mmol/L — ABNORMAL HIGH (ref 22–32)
pCO2 arterial: 44.6 mmHg (ref 32.0–48.0)
pH, Arterial: 7.502 — ABNORMAL HIGH (ref 7.350–7.450)
pO2, Arterial: 77 mmHg — ABNORMAL LOW (ref 83.0–108.0)

## 2021-04-10 LAB — CBC WITH DIFFERENTIAL/PLATELET
Abs Immature Granulocytes: 0.09 10*3/uL — ABNORMAL HIGH (ref 0.00–0.07)
Basophils Absolute: 0 10*3/uL (ref 0.0–0.1)
Basophils Relative: 0 %
Eosinophils Absolute: 0.2 10*3/uL (ref 0.0–0.5)
Eosinophils Relative: 1 %
HCT: 24.4 % — ABNORMAL LOW (ref 39.0–52.0)
Hemoglobin: 7.9 g/dL — ABNORMAL LOW (ref 13.0–17.0)
Immature Granulocytes: 1 %
Lymphocytes Relative: 6 %
Lymphs Abs: 0.8 10*3/uL (ref 0.7–4.0)
MCH: 25.9 pg — ABNORMAL LOW (ref 26.0–34.0)
MCHC: 32.4 g/dL (ref 30.0–36.0)
MCV: 80 fL (ref 80.0–100.0)
Monocytes Absolute: 1 10*3/uL (ref 0.1–1.0)
Monocytes Relative: 7 %
Neutro Abs: 11.1 10*3/uL — ABNORMAL HIGH (ref 1.7–7.7)
Neutrophils Relative %: 85 %
Platelets: 187 10*3/uL (ref 150–400)
RBC: 3.05 MIL/uL — ABNORMAL LOW (ref 4.22–5.81)
RDW: 20.2 % — ABNORMAL HIGH (ref 11.5–15.5)
WBC: 13.1 10*3/uL — ABNORMAL HIGH (ref 4.0–10.5)
nRBC: 0 % (ref 0.0–0.2)

## 2021-04-10 LAB — PHOSPHORUS
Phosphorus: 2.8 mg/dL (ref 2.5–4.6)
Phosphorus: 2.7 mg/dL (ref 2.5–4.6)

## 2021-04-10 LAB — MAGNESIUM
Magnesium: 1.7 mg/dL (ref 1.7–2.4)
Magnesium: 2 mg/dL (ref 1.7–2.4)

## 2021-04-10 LAB — GLUCOSE, CAPILLARY
Glucose-Capillary: 129 mg/dL — ABNORMAL HIGH (ref 70–99)
Glucose-Capillary: 85 mg/dL (ref 70–99)
Glucose-Capillary: 132 mg/dL — ABNORMAL HIGH (ref 70–99)
Glucose-Capillary: 200 mg/dL — ABNORMAL HIGH (ref 70–99)
Glucose-Capillary: 190 mg/dL — ABNORMAL HIGH (ref 70–99)
Glucose-Capillary: 264 mg/dL — ABNORMAL HIGH (ref 70–99)
Glucose-Capillary: 139 mg/dL — ABNORMAL HIGH (ref 70–99)
Glucose-Capillary: 124 mg/dL — ABNORMAL HIGH (ref 70–99)

## 2021-04-10 LAB — COOXEMETRY PANEL
Carboxyhemoglobin: 2 % — ABNORMAL HIGH (ref 0.5–1.5)
Methemoglobin: 0.8 % (ref 0.0–1.5)
O2 Saturation: 65.2 %
Total hemoglobin: 8.3 g/dL — ABNORMAL LOW (ref 12.0–16.0)

## 2021-04-10 LAB — PROTIME-INR
INR: 1.5 — ABNORMAL HIGH (ref 0.8–1.2)
Prothrombin Time: 18.3 seconds — ABNORMAL HIGH (ref 11.4–15.2)

## 2021-04-10 LAB — LACTATE DEHYDROGENASE
LDH: 206 U/L — ABNORMAL HIGH (ref 98–192)
LDH: 201 U/L — ABNORMAL HIGH (ref 98–192)

## 2021-04-10 MED ORDER — MIDODRINE HCL 5 MG PO TABS
15.0000 mg | ORAL_TABLET | Freq: Three times a day (TID) | ORAL | Status: DC
  Administered 2021-04-10 – 2021-04-21 (×33): 15 mg via ORAL
  Filled 2021-04-10 (×31): qty 3

## 2021-04-10 MED ORDER — POTASSIUM CHLORIDE CRYS ER 20 MEQ PO TBCR
40.0000 meq | EXTENDED_RELEASE_TABLET | ORAL | Status: AC
  Administered 2021-04-10 (×3): 40 meq via ORAL

## 2021-04-10 MED ORDER — MAGNESIUM SULFATE 2 GM/50ML IV SOLN
2.0000 g | Freq: Once | INTRAVENOUS | Status: AC
  Administered 2021-04-10: 2 g via INTRAVENOUS

## 2021-04-10 MED ORDER — FUROSEMIDE 10 MG/ML IJ SOLN
INTRAMUSCULAR | Status: AC
  Filled 2021-04-10: qty 8

## 2021-04-10 MED ORDER — MAGNESIUM SULFATE 2 GM/50ML IV SOLN
INTRAVENOUS | Status: AC
  Filled 2021-04-10: qty 50

## 2021-04-10 MED ORDER — POTASSIUM CHLORIDE CRYS ER 20 MEQ PO TBCR: 20.0000 meq | EXTENDED_RELEASE_TABLET | Freq: Once | ORAL | Status: AC

## 2021-04-10 MED ORDER — WARFARIN SODIUM 2.5 MG PO TABS
ORAL_TABLET | ORAL | Status: AC
  Filled 2021-04-10: qty 1

## 2021-04-10 MED ORDER — POTASSIUM CHLORIDE CRYS ER 20 MEQ PO TBCR
EXTENDED_RELEASE_TABLET | ORAL | Status: AC
  Filled 2021-04-10: qty 2

## 2021-04-10 MED ORDER — POLYETHYLENE GLYCOL 3350 17 G PO PACK
17.0000 g | PACK | Freq: Every day | ORAL | Status: DC
  Administered 2021-04-11 – 2021-04-16 (×6): 17 g via ORAL
  Filled 2021-04-10 (×7): qty 1

## 2021-04-10 MED ORDER — MIDODRINE HCL 5 MG PO TABS
ORAL_TABLET | ORAL | Status: AC
  Filled 2021-04-10: qty 3

## 2021-04-10 MED ORDER — WARFARIN SODIUM 2.5 MG PO TABS
2.5000 mg | ORAL_TABLET | Freq: Once | ORAL | Status: AC
  Administered 2021-04-10: 2.5 mg via ORAL

## 2021-04-10 MED ORDER — POTASSIUM CHLORIDE CRYS ER 20 MEQ PO TBCR
EXTENDED_RELEASE_TABLET | ORAL | Status: AC
  Administered 2021-04-10: 20 meq via ORAL
  Filled 2021-04-10: qty 1

## 2021-04-10 MED ORDER — MAGNESIUM SULFATE 2 GM/50ML IV SOLN: 2.0000 g | Freq: Once | INTRAVENOUS | Status: DC

## 2021-04-10 MED ORDER — POLYETHYLENE GLYCOL 3350 17 G PO PACK
PACK | ORAL | Status: AC
  Filled 2021-04-10: qty 1

## 2021-04-10 MED ORDER — FUROSEMIDE 10 MG/ML IJ SOLN
80.0000 mg | Freq: Two times a day (BID) | INTRAMUSCULAR | Status: AC
  Administered 2021-04-10 (×2): 80 mg via INTRAVENOUS

## 2021-04-10 NOTE — Progress Notes (Addendum)
Patient ID: Brandon [name], male   DOB: 1955-05-25, 65 y.o.   MRN: [id number]     Advanced Heart Failure Rounding Note  PCP-Cardiologist: [name], MD   Subjective:    Admitted with sepsis. Started on cefepime + vanc, now off abx (ID following). Afebrile.  10/29 Co-ox 48%, started on milrinone 0.25.  10/30 IV Antibiotics stopped. Blood cultures no growth for 5 days.  10/31 started on Augmentin for PNA.  11/1 Milrinone off 11/2 Milrinone added back. Midodrine increased to 10 mg TID 11/10 Developed Afib w/ RVR, converted with IV amio 11/14 Milrinone increased to 0.25 mcg. Diuresed with IV lasix  11/25 Underwent HM-3 VAD placement and bioprosthetic AVR. Had persistent hypotension in OR requiring methylene blue and high dose pressors.  11/26 Extubated 11/28 Started on lasix gtt>>later stopped due to low CVP (1) and increasing pressor requirements  Required titration of NE yesterday to support BP. Titrated from 6>>15 today. MAPs low 70s. AF. WBC continues to trend down, 27>24>18>13K.   -4.3L UOP yesterday. Wt is down 4 lb. Remains off lasix gtt. CVP 4-5   On Epi 3, NE 15 and milrinone 0.125. Co-ox 65%. Hgb 8.5.   Had BM x 2 yesterday after sorbitol.  Feels well today. OOB and sitting up in chair. Finished breakfast. Feels appetite is picking up. No dyspnea.   LVAD Interrogation HM 3: Speed: 5500 Flow: 4.5 PI: 3.7 Power: 4.3. No PI events. VAD interrogated personally. Parameters stable.     Objective:   Weight Range: 83.4 kg Body mass index is 24.94 kg/m.   Vital Signs:   Temp:  [97.6 F (36.4 C)-98.4 F (36.9 C)] 98.4 F (36.9 C) (11/28 2325) Pulse Rate:  [58-135] 107 (11/29 0700) Resp:  [14-30] 23 (11/29 0700) BP: (82-129)/(66-110) 129/106 (11/29 0600) SpO2:  [84 %-100 %] 94 % (11/29 0700) Arterial Line BP: (56-126)/(44-92) 99/77 (11/29 0700) Weight:  [83.4 kg] 83.4 kg (11/29 0650) Last BM Date: 04/09/21  Weight change: Filed Weights   04/08/21 0500 04/09/21  0620 04/10/21 0650  Weight: 85 kg 84.9 kg 83.4 kg    Intake/Output:   Intake/Output Summary (Last 24 hours) at 04/10/2021 0755 Last data filed at 04/10/2021 0530 Gross per 24 hour  Intake 3294.66 ml  Output 4452 ml  Net -1157.34 ml     Physical Exam   CVP 4-5  General:  Thin WM. OOB sitting up in chair. No distress. No respiratory difficulty  HEENT: normal  Neck: supple. No JVD.  Cor: sternal wound ok LVAD hum. + CT Lungs: CTAB. No wheezing  Abdomen: soft, nontender, non-distended. No hepatosplenomegaly. No bruits or masses. Good bowel sounds. Driveline site clean. Anchor in place.  Extremities: no cyanosis, clubbing, rash. Warm no edema +RUE PICC  Neuro: alert & oriented x 3. No focal deficits. Moves all 4 without problem    Telemetry   NSR 90s Personally reviewed   Labs    CBC Recent Labs    04/09/21 0359 04/09/21 0409 04/10/21 0233 04/10/21 0425  WBC 18.2*  --  13.1*  --   NEUTROABS 16.3*  --  11.1*  --   HGB 7.6*   < > 7.9* 8.5*  HCT 23.5*   < > 24.4* 25.0*  MCV 79.7*  --  80.0  --   PLT 154  --  187  --    < > = values in this interval not displayed.   Basic Metabolic Panel Recent Labs    04/09/21 0359 04/09/21 0409 04/10/21  6283 04/10/21 0425 04/10/21 0703  NA 132*   < >  --  135 132*  K 3.6   < >  --  2.9* 3.2*  CL 97*  --   --   --  92*  CO2 28  --   --   --  29  GLUCOSE 102*  --   --   --  136*  BUN 18  --   --   --  15  CREATININE 0.60*  --   --   --  0.58*  CALCIUM 8.6*  --   --   --  8.3*  MG 1.9  --  1.7  --   --   PHOS 3.4  --  2.8  --   --    < > = values in this interval not displayed.   Liver Function Tests Recent Labs    04/08/21 0359 04/09/21 0359  AST 53* 34  ALT 37 29  ALKPHOS 68 78  BILITOT 2.6* 1.6*  PROT 5.6* 5.6*  ALBUMIN 2.7* 2.5*    No results for input(s): LIPASE, AMYLASE in the last 72 hours. Cardiac Enzymes No results for input(s): CKTOTAL, CKMB, CKMBINDEX, TROPONINI in the last 72 hours.  BNP: BNP  (last 3 results) Recent Labs    01/08/21 0731 01/18/21 1629 04/07/21 0500  BNP 734.2* 607.9* 663.2*    ProBNP (last 3 results) No results for input(s): PROBNP in the last 8760 hours.    D-Dimer No results for input(s): DDIMER in the last 72 hours. Hemoglobin A1C No results for input(s): HGBA1C in the last 72 hours.  Fasting Lipid Panel No results for input(s): CHOL, HDL, LDLCALC, TRIG, CHOLHDL, LDLDIRECT in the last 72 hours. Thyroid Function Tests No results for input(s): TSH, T4TOTAL, T3FREE, THYROIDAB in the last 72 hours.  Invalid input(s): FREET3  Other results:   Imaging    DG CHEST PORT 1 VIEW  Result Date: 04/09/2021 CLINICAL DATA:  Central line placement EXAM: PORTABLE CHEST 1 VIEW COMPARISON:  04/09/2021, CT 03/11/2021, radiograph 04/07/2021, 04/06/2021, 03/21/2021 FINDINGS: Right-sided stimulator generator with ascending lead. Post sternotomy changes with valve prosthesis and partially visible LVAD. Left-sided central venous catheter tip superior to the aortic arch. Interval placement of right upper extremity central venous catheter with the tip at the cavoatrial region. Cardiomegaly with emphysema. Diffuse bilateral interstitial and ground-glass opacity may be due to superimposed pneumonia or edema. There are small pleural effusions. Aeration may be slightly improved at the right base compared to yesterday. IMPRESSION: 1. Interim placement of right upper extremity central venous catheter with tip visualized over the cavoatrial region. Left IJ sheath remains in place. 2. Cardiomegaly with vascular congestion and diffuse bilateral interstitial and ground-glass opacity likely due to edema or pneumonia superimposed on underlying emphysema. Small bilateral effusions with persistent consolidation left lung base. Aeration may be slightly improved at the right lung base. Electronically Signed   By: Donavan Foil M.D.   On: 04/09/2021 23:17     Medications:     Scheduled  Medications:  (feeding supplement) PROSource Plus  30 mL Oral TID PC & HS   sodium chloride   Intravenous Once   acetaminophen  1,000 mg Oral Q6H   Or   acetaminophen (TYLENOL) oral liquid 160 mg/5 mL  1,000 mg Per Tube Q6H   aspirin EC  325 mg Oral Daily   Or   aspirin  324 mg Per Tube Daily   Or   aspirin  300 mg Rectal  Daily   bisacodyl  10 mg Oral Daily   Or   bisacodyl  10 mg Rectal Daily   Chlorhexidine Gluconate Cloth  6 each Topical Daily   digoxin  0.125 mg Oral Daily   docusate sodium  200 mg Oral Daily   ezetimibe  10 mg Oral Daily   feeding supplement  237 mL Oral TID BM   folic acid  1 mg Oral Daily   gabapentin  200 mg Oral TID   insulin aspart  0-15 Units Subcutaneous Q4H   insulin detemir  15 Units Subcutaneous Daily   mouth rinse  15 mL Mouth Rinse BID   metoCLOPramide (REGLAN) injection  10 mg Intravenous Q6H   midodrine  10 mg Oral TID WC   multivitamin with minerals  1 tablet Oral Daily   pantoprazole  40 mg Oral Daily   potassium chloride  40 mEq Oral Q2H   sertraline  100 mg Oral Daily   sodium chloride flush  10-40 mL Intracatheter Q12H   sodium chloride flush  10-40 mL Intracatheter Q12H   sodium chloride flush  3 mL Intravenous Q12H   tamsulosin  0.4 mg Oral Daily   vitamin B-12  100 mcg Oral Daily   warfarin  2.5 mg Oral ONCE-1600   Warfarin - Pharmacist Dosing Inpatient   Does not apply q1600    Infusions:  sodium chloride Stopped (04/07/21 0943)   sodium chloride     sodium chloride Stopped (04/09/21 2237)   amiodarone 30 mg/hr (04/10/21 0209)   epinephrine 3 mcg/min (04/10/21 0209)   furosemide (LASIX) 200 mg in dextrose 5% 100 mL (2mg /mL) infusion 8 mg/hr (04/10/21 0209)   lactated ringers 10 mL/hr at 04/10/21 0209   lactated ringers 20 mL/hr at 04/06/21 2130   magnesium sulfate bolus IVPB     milrinone 0.125 mcg/kg/min (04/10/21 0209)   norepinephrine (LEVOPHED) Adult infusion 17 mcg/min (04/10/21 0209)     PRN Medications: sodium  chloride, diphenhydrAMINE, melatonin, morphine injection, ondansetron (ZOFRAN) IV, oxyCODONE, polyvinyl alcohol, sodium chloride flush, sodium chloride flush, sodium chloride flush, traMADol    Patient Profile   Mr Nudd is a 65 year old with history of smoker, CAD s/p previous MI, HTN, HL, chronic HFeEF, sepsis 11/2020 bacteremia.  Admitted with sepsis. Bld Cx - NGTD    Assessment/Plan   1. Acute on chronic systolic HFr EF -> cardiogenic shock - Echo 05/19/20 EF 20-25% RV mildly HK. moderate AS  Mean gradient 13 AVA 1.2 cm2 DI 0.30 - Persistent NYHA IV. Admitted with shock - s/p HM-III VAD + bioprosthetic AVR on 11/25 - RV looked good on TEE in OR - MAP low 70s this morning, now on milrinone 0.125, NE 15, epi 3.  Co-ox 65%  - Continue midodrine 10 tid and wean NE today as tolerated - Off Lasix gtt w/ low CVP, 4-5 today. Still 12 lb above pre-op wt  - Continue digoxin 0.125.  - Consider Spiro 12.5 mg daily   2. HM-3 LVAD implant - Plan as above - transfuse to keep hgb > 8.0 - VAD interrogated personally. Parameters stable. - Speed increased to 5500 on 11/25.  - ASA 325 until INR 1.8. INR 1.5 today - Continue warfarin. Increase slowly in setting of recent hemorrhagic pericarditis    3. Acute Hypoxic Respiratory Failure  - Extubated 11/26, stable on 4L [place].    4. Severe low-flow aortic stenosis s/p AVR - not candidate for TAVR due to presence of fibroelastoma - s/p VAD/bioprosthetic  AVR on 11/25   5. CAD  s/p anterior STEMI (12/20). LHC showed chronically occluded RCA (with L>>R collaterals) and thrombotic occlusion of proximal LAD. Underwent PCI of LAD. - Continue atorvastatin 80.   6. Iron-deficiency anemia & ABLA - Rec'd Feraheme 11/8 and 11/13  - Keep hgb > 7.5 (8.5 today).   7. Hypokalemia - 2.9>>3.2 today  - supp w/ KCl  - follow BMP  - Add spiro 12.5   8. Severe protein-calorie malnutrition - prealbumin 11>15> 13 despite adequate caloric intake on calorie  count - Continue aggressive nutritional support  9. Paroxysmal Atrial Fibrillation w/ RVR - new, developed 11/10. Now back in NSR   - Continue IV amio  10. Hyponatremia - Na 132 currently, FW restrict.   11. Sepsis - H/o strep bacteremia 11/2020.  ICD extracted 12/04/20. Barostim was not removed as it is extravascular.  - Received IV antibiotics until 01/15/21. Blood cultures negative 01/30/21..  - 10/26 -Blood CX x2  Negative  - Ct chest concerning for pneumonia. Treated w/ cefepime/vancomycin -> Augmentin (stop 11/7) - Now afebrile, WBCs 25 -> 18->13  12. Hemorrhagic Pericarditis - Fibrin clot encasing heart at time of surgery.  Will need to be careful with anticoagulation (go slowly).    Length of Stay: [address] PA-C  04/10/2021, 7:55 AM  Advanced Heart Failure Team Pager [phone] (M-F; 7a - 5p)  Please contact [facility] for night-coverage after hours (5p -7a ) and weekends on [email or web address]  Agree. With above. Lasix gtt stopped last night for low CVP and increasing pressor requirements. Weight down 4 pounds but still up 10-15 pounds from baseline  Now on NE 15, epi 3, milrinone 0.125. CVP 5  Surgical path with evidence of "acute endocarditis" No f/c WBC falling  General:  Sitting up in bed NAD.  HEENT: normal  Neck: supple. JVP not elevated.  Carotids 2+ bilat; no bruits. No lymphadenopathy or thryomegaly appreciated. Cor: LVAD hum. Surgical wound ok. + CT Lungs: Clear. Abdomen: soft, nontender, non-distended. No hepatosplenomegaly. No bruits or masses. Good bowel sounds. Driveline site clean. Anchor in place.  Extremities: no cyanosis, clubbing, rash. Warm no edema  Neuro: alert & oriented x 3. No focal deficits. Moves all 4 without problem   Remains on multiple pressors/inotropes. Co-ox ok. Need to wean slowly. Will increase midodrine to 15 tid . Change lasix to 80 IV bid. VAD interrogated personally. Parameters stable. Continue to go slow with warfarin.  Discussed dosing with PharmD personally.  Will mobilize. I discussed path findings with ID who will leave recs later today. (Thank you)  CRITICAL CARE Performed by: Glori Bickers  Total critical care time: 35 minutes  Critical care time was exclusive of separately billable procedures and treating other patients.  Critical care was necessary to treat or prevent imminent or life-threatening deterioration.  Critical care was time spent personally by me (independent of midlevel providers or residents) on the following activities: development of treatment plan with patient and/or surrogate as well as nursing, discussions with consultants, evaluation of patient's response to treatment, examination of patient, obtaining history from patient or surrogate, ordering and performing treatments and interventions, ordering and review of laboratory studies, ordering and review of radiographic studies, pulse oximetry and re-evaluation of patient's condition.  Glori Bickers, MD  9:45 AM

## 2021-04-10 NOTE — Progress Notes (Signed)
Patient ID: Brandon [name], male   DOB: 12-Jan-1956, 65 y.o.   MRN: [id number] HeartMate 3 Rounding Note  Subjective:    Stable overnight. Feels well this am. Looking forward to walking.  Milrinone 0.125, epi 3, NE 17 Co-ox 65.2%  CVP 5 this am by my measurement.  -1025 cc yesterday. Wt down 3+ lbs. 12 lbs over preop. Remains on lasix drip   LVAD INTERROGATION:  HeartMate IIl LVAD:  Flow 4.2 liters/min, speed 5500, power 4.3, PI 3.7.   Objective:    Vital Signs:   Temp:  [97.6 F (36.4 C)-98.4 F (36.9 C)] 98.4 F (36.9 C) (11/28 2325) Pulse Rate:  [58-135] 103 (11/29 0600) Resp:  [14-30] 21 (11/29 0600) BP: (82-129)/(66-110) 129/106 (11/29 0600) SpO2:  [84 %-100 %] 96 % (11/29 0600) Arterial Line BP: (56-126)/(44-92) 87/66 (11/29 0600) Weight:  [83.4 kg] 83.4 kg (11/29 0650) Last BM Date: 04/09/21 Mean arterial Pressure 80's  Intake/Output:   Intake/Output Summary (Last 24 hours) at 04/10/2021 0704 Last data filed at 04/10/2021 0530 Gross per 24 hour  Intake 3426.55 ml  Output 4452 ml  Net -1025.45 ml     Physical Exam: General:  Well appearing. No resp difficulty HEENT: normal Neck: sleeve is out Cor: Normal heart sounds with LVAD hum present. Lungs: diminished in lower lobes Abdomen: soft, nontender, nondistended. Good bowel sounds. Extremities: mild LE edema Neuro: alert & orientedx3, cranial nerves grossly intact. moves all 4 extremities w/o difficulty. Affect pleasant  Telemetry: sinus   Labs: Basic Metabolic Panel: Recent Labs  Lab 04/07/21 0500 04/07/21 0525 04/07/21 1557 04/07/21 1559 04/07/21 2257 04/08/21 0359 04/08/21 0528 04/08/21 1508 04/09/21 0359 04/09/21 0409 04/09/21 1951 04/10/21 0233 04/10/21 0425  NA 132*   < > 132*   < > 134* 130* 132* 130* 132* 133*  --   --  135  K 3.0*   < > 3.6   < > 3.6 3.8 3.7 3.4* 3.6 3.6 3.1*  --  2.9*  CL 97*  --  97*  --  99 95*  --  95* 97*  --   --   --   --   CO2 28  --  28  --  28 26  --  27 28   --   --   --   --   GLUCOSE 109*  --  156*  --  107* 65*  --  103* 102*  --   --   --   --   BUN 27*  --  26*  --  26* 24*  --  23 18  --   --   --   --   CREATININE 0.79  --  0.83  --  0.78 0.69  --  0.73 0.60*  --   --   --   --   CALCIUM 8.2*  --  8.3*  --  8.3* 8.4*  --  8.5* 8.6*  --   --   --   --   MG 2.2  --  2.1  --   --  2.0  --   --  1.9  --   --  1.7  --   PHOS 4.5  --   --   --   --  3.3  --   --  3.4  --   --  2.8  --    < > = values in this interval not displayed.    Liver Function Tests: Recent Labs  Lab 04/04/21 0502 04/07/21 0500 04/08/21 0359 04/09/21 0359  AST 21 86* 53* 34  ALT 26 50* 37 29  ALKPHOS 96 51 68 78  BILITOT 0.5 4.4* 2.6* 1.6*  PROT 7.2 5.2* 5.6* 5.6*  ALBUMIN 2.7* 2.9* 2.7* 2.5*   No results for input(s): LIPASE, AMYLASE in the last 168 hours. No results for input(s): AMMONIA in the last 168 hours.  CBC: Recent Labs  Lab 04/07/21 0500 04/07/21 0525 04/07/21 1557 04/07/21 1559 04/08/21 0359 04/08/21 0528 04/09/21 0359 04/09/21 0409 04/10/21 0233 04/10/21 0425  WBC 26.3*  --  27.0*  --  24.9*  --  18.2*  --  13.1*  --   NEUTROABS 23.4*  --   --   --  22.2*  --  16.3*  --  11.1*  --   HGB 8.9*   < > 8.2*   < > 8.4* 8.2* 7.6* 8.2* 7.9* 8.5*  HCT 26.2*   < > 25.9*   < > 24.6* 24.0* 23.5* 24.0* 24.4* 25.0*  MCV 78.4*  --  79.9*  --  79.6*  --  79.7*  --  80.0  --   PLT 163  --  153  --  141*  --  154  --  187  --    < > = values in this interval not displayed.    INR: Recent Labs  Lab 04/06/21 1551 04/07/21 0500 04/08/21 0359 04/09/21 0359 04/10/21 0233  INR 1.6* 1.3* 1.3* 1.4* 1.5*    Other results: EKG:   Imaging: DG CHEST PORT 1 VIEW  Result Date: 04/09/2021 CLINICAL DATA:  Central line placement EXAM: PORTABLE CHEST 1 VIEW COMPARISON:  04/09/2021, CT 03/11/2021, radiograph 04/07/2021, 04/06/2021, 03/21/2021 FINDINGS: Right-sided stimulator generator with ascending lead. Post sternotomy changes with valve prosthesis and  partially visible LVAD. Left-sided central venous catheter tip superior to the aortic arch. Interval placement of right upper extremity central venous catheter with the tip at the cavoatrial region. Cardiomegaly with emphysema. Diffuse bilateral interstitial and ground-glass opacity may be due to superimposed pneumonia or edema. There are small pleural effusions. Aeration may be slightly improved at the right base compared to yesterday. IMPRESSION: 1. Interim placement of right upper extremity central venous catheter with tip visualized over the cavoatrial region. Left IJ sheath remains in place. 2. Cardiomegaly with vascular congestion and diffuse bilateral interstitial and ground-glass opacity likely due to edema or pneumonia superimposed on underlying emphysema. Small bilateral effusions with persistent consolidation left lung base. Aeration may be slightly improved at the right lung base. Electronically Signed   By: Donavan Foil M.D.   On: 04/09/2021 23:17   DG CHEST PORT 1 VIEW  Result Date: 04/09/2021 CLINICAL DATA:  Left ventricular assist device placement. EXAM: PORTABLE CHEST 1 VIEW COMPARISON:  April 08, 2021. FINDINGS: Stable cardiomegaly. Sternotomy wires are noted. Left ventricular assist device is unchanged in position. No definite pneumothorax is noted status post left-sided chest tube removal. Mild bibasilar atelectasis or edema is noted with possible small pleural effusions. IMPRESSION: No definite pneumothorax status post left-sided chest tube removal. Left ventricular assist device is unchanged in position. Mild bibasilar atelectasis or edema is noted with possible small pleural effusions. Electronically Signed   By: Marijo Conception M.D.   On: 04/09/2021 08:15   [place] EKG SITE RITE  Result Date: 04/09/2021 If Site Rite image not attached, placement could not be confirmed due to current cardiac rhythm.    Medications:     Scheduled Medications:  (  feeding supplement) PROSource Plus   30 mL Oral TID PC & HS   sodium chloride   Intravenous Once   acetaminophen  1,000 mg Oral Q6H   Or   acetaminophen (TYLENOL) oral liquid 160 mg/5 mL  1,000 mg Per Tube Q6H   aspirin EC  325 mg Oral Daily   Or   aspirin  324 mg Per Tube Daily   Or   aspirin  300 mg Rectal Daily   bisacodyl  10 mg Oral Daily   Or   bisacodyl  10 mg Rectal Daily   Chlorhexidine Gluconate Cloth  6 each Topical Daily   digoxin  0.125 mg Oral Daily   docusate sodium  200 mg Oral Daily   ezetimibe  10 mg Oral Daily   feeding supplement  237 mL Oral TID BM   folic acid  1 mg Oral Daily   gabapentin  200 mg Oral TID   insulin aspart  0-15 Units Subcutaneous Q4H   insulin detemir  15 Units Subcutaneous Daily   mouth rinse  15 mL Mouth Rinse BID   metoCLOPramide (REGLAN) injection  10 mg Intravenous Q6H   midodrine  10 mg Oral TID WC   multivitamin with minerals  1 tablet Oral Daily   pantoprazole  40 mg Oral Daily   sertraline  100 mg Oral Daily   sodium chloride flush  10-40 mL Intracatheter Q12H   sodium chloride flush  10-40 mL Intracatheter Q12H   sodium chloride flush  3 mL Intravenous Q12H   tamsulosin  0.4 mg Oral Daily   vitamin B-12  100 mcg Oral Daily   warfarin  2.5 mg Oral ONCE-1600   Warfarin - Pharmacist Dosing Inpatient   Does not apply q1600    Infusions:  sodium chloride Stopped (04/07/21 0943)   sodium chloride     sodium chloride Stopped (04/09/21 2237)   amiodarone 30 mg/hr (04/10/21 0209)   epinephrine 3 mcg/min (04/10/21 0209)   furosemide (LASIX) 200 mg in dextrose 5% 100 mL (2mg /mL) infusion 8 mg/hr (04/10/21 0209)   lactated ringers 10 mL/hr at 04/10/21 0209   lactated ringers 20 mL/hr at 04/06/21 2130   milrinone 0.125 mcg/kg/min (04/10/21 0209)   norepinephrine (LEVOPHED) Adult infusion 17 mcg/min (04/10/21 0209)    PRN Medications: sodium chloride, diphenhydrAMINE, melatonin, morphine injection, ondansetron (ZOFRAN) IV, oxyCODONE, polyvinyl alcohol, sodium  chloride flush, sodium chloride flush, sodium chloride flush, traMADol   Assessment:    POD 4 HM 3 LVAD and AVR with bioprosthetic valve for acute on chronic systolic heart failure with EF 20-25% and cardiogenic shock requiring milrinone. He had a bicupid AV with moderate AS and AI. Intraop severe vasoplegia requiring multiple vasopressors and methylene blue. RV function looked good. PAF with RVR maintaing sinus on IV amio. 3.   Anklosing spondylitis and UE peripheral neuropathy related to that. 4.   Severe protein-calorie malnutrition with prealbumin 13 despite good caloric intact and protein supplements. 5.   Strep bacteremia 11/2020 with ICD extraction 12/04/20 and IV antibiotics until 01/15/21. BC negative 01/30/21 and 03/07/21 when he presented again with signs of sepsis. CT chest was concerning for pneumonia and he was treated with Maxepime and Lucianne Lei, switched to Augmentin and stopped 03/19/2021. Pathology of valve felt to show signs of acute endocarditis ( whatever that means since no explanation in report). 6.   Hyponatremia 7.   Hypokalemia 8.   Iron deficiency anemia and acute postop blood loss anemia. 9.   Noted to  have recent hemorrhagic pericarditis with heart encased in recent organized thrombus on Eliquis preop before stopping for surgery. 10.  Large bilateral pleural effusions drained at the time of surgery, related to heart failure.  Plan/Discussion:    He is hemodynamically stable on current inotrope/vasopressors with good Co-ox. Wean NE as tolerated. He looks great overall.   Volume excess: continue lasix drip per DB. Continue KCl replacement. Followup on  BMET this am.   Pocket drain to stay in today.   Coumadin per pharmacy slowly.   Consult ID about need for further antibiotics.   IS, OOB, ambulate.    I reviewed the LVAD parameters from today, and compared the results to the patient's prior recorded data.  No programming changes were made.  The LVAD is functioning  within specified parameters.  The patient performs LVAD self-test daily.  LVAD interrogation was negative for any significant power changes, alarms or PI events/speed drops.  LVAD equipment check completed and is in good working order.  Back-up equipment present.   LVAD education done on emergency procedures and precautions and reviewed exit site care.  Length of Stay: [place] 04/10/2021, 7:04 AM

## 2021-04-10 NOTE — Progress Notes (Signed)
LVAD Coordinator Rounding Note:  Admitted 03/07/21 due to sepsis. Dr. [name] consulted as his HF Cardiologist.   HM III LVAD implanted on 04/06/21 by Dr. Cyndia Bent under Destination Therapy criteria. Not a transplant candidate at this time due to recurrent infections.   Patient sitting up in the recliner upon my arrival. States he is feeling ok this morning, but is complaining of chest soreness and pocket pain. He states PRN pain medications are helping with his pain.   Lasix gtt stopped overnight. Remains on Levophed 14 mcg for BP support. CVP 2 this morning. Weight down 4 lbs. Continue fluid restriction 1800 mL with Na 132 today.   No family currently at bedside.   Pt education as documented below.   Vital signs: Temp: 98.5 HR: 100 ST [place]:  91/67 (81)  O2 Sat: 98% on 4 L/[place] Wt:171.5>190>187.4>187>183.8 lbs     LVAD interrogation reveals:  Speed: 5500 Flow: 4.6 Power:  4.0 w PI: 4.0  Alarms: none Events:  none Hematocrit: 24  Fixed speed: 5500 Low speed limit: 5200  Drive Line: Existing VAD dressing removed and site care performed using sterile technique. Drive line exit site cleaned with Chlora prep applicators x 2, allowed to dry, and gauze dressing with silver strip applied. Exit site unincorporated with suture intact, the velour is fully implanted at exit site. Slight redness around sutures noted. Small amount serous drainage, no tenderness, foul odor or rash noted. Drive line anchor re-applied. Daily dressings per VAD Coordinator, Nurse Davonna Belling, or trained caregiver. Next dressing change due 04/11/21.      Labs:  LDH trend: 309>248>215>206  INR trend: 1.6>1.3>1.3>1.4>1.5  Anticoagulation Plan: -INR Goal: 2 - 2.5 -ASA Dose: 325 mg daily until INR therapeutic  Blood Products:   Intra-op: - 04/06/21>>4 PRBCs, 4 FFP, 1263 cc cell saver  Post-op: - 04/06/21>>1 unit Plts - 04/07/21>>1 unit PRBC  Device: N/A  Arrythmias: 03/22/21 - developed Afib w/ RVR,  converted with IV amiodarone  Respiratory: extubated 04/07/21  Nitric Oxide: off 04/07/21  Gtts: - Epinephrine>>3 mcg/min - Milrinone>>0.125 mcg/kg/min - Levophed>>14 mcg/min - Amiodarone>>30 mg/hr  Patient/Family Teaching:  No family at bedside today Encouraged pt to continue reviewing HM III Patient Handbook  prior to formal discharge teaching. Pt verbalized understanding of same.  Demonstrated and verbalized dressing change with patient. Discussed importance of sterile technique, sterile gloves, cap, and anchor device.  Discussed importance of "one power source at a time." Pt demonstrated power source change from wall power to batteries, and back to wall power. His dexterity needs work, but he understands concept.  Pt practiced placing batteries in and out of clips. Demonstrated how to check battery life on battery. Discussed importance of using fully charged batteries. Discussed with bedside nurse importance of pt handling equipment/power source changes.  Pt falling asleep after the above teaching. Will continue teaching at another time.  Plan/Recommendations:  1. Call VAD Coordinator for any VAD equipment or drive line issues. 2.  Daily dressing changes per VAD Coordinator, Nurse Davonna Belling, or trained caregiver.   Emerson Monte RN [place] Coordinator  Office: [phone]  24/7 Pager: [phone]

## 2021-04-10 NOTE — Progress Notes (Addendum)
ANTICOAGULATION CONSULT NOTE   Pharmacy Consult for warfarin Indication:  s/p VAD (HM3) , afib, hx DVT  No Active Allergies  Patient Measurements: Height: 6' (182.9 cm) Weight: 84.9 kg (187 lb 2.7 oz) IBW/kg (Calculated) : 77.6   Vital Signs: Temp: 98.4 F (36.9 C) (11/28 2325) Temp Source: Oral (11/28 2325) BP: 120/110 (11/29 0400) Pulse Rate: 99 (11/29 0400)  Labs: Recent Labs    04/08/21 0359 04/08/21 0528 04/08/21 1508 04/09/21 0359 04/09/21 0409 04/10/21 0233 04/10/21 0425  HGB 8.4*   < >  --  7.6* 8.2* 7.9* 8.5*  HCT 24.6*   < >  --  23.5* 24.0* 24.4* 25.0*  PLT 141*  --   --  154  --  187  --   LABPROT 16.5*  --   --  17.2*  --  18.3*  --   INR 1.3*  --   --  1.4*  --  1.5*  --   CREATININE 0.69  --  0.73 0.60*  --   --   --    < > = values in this interval not displayed.     Estimated Creatinine Clearance: 98.3 mL/min (A) (by C-G formula based on SCr of 0.6 mg/dL (L)).   Medical History: Past Medical History:  Diagnosis Date   AICD (automatic cardioverter/defibrillator) present 08/31/2019   Ankylosing spondylitis ([place])    Arthritis    BENIGN PROSTATIC HYPERTROPHY 06/07/2008   CHF (congestive heart failure) ([place])    COLONIC POLYPS, HX OF 06/07/2008   Depression    H/O hiatal hernia    Heart failure ([place])    HYPERLIPIDEMIA 06/07/2008   HYPERTENSION 06/07/2008   Myocardial infarction [facility]) 2005   NSTEMI, s/p LAD stent   NEPHROLITHIASIS, HX OF 06/07/2008   STEMI (ST elevation myocardial infarction) ([place]) 04/27/2019     Assessment: 65 yo M s/p HM 3 and AVR on 04/06/21. PTA patient was on apixiban for a DVT. Patient had complicated post-op course with vasoplegia requiring methylene blue and severe hemorrhagic pericarditis at the time of surgery. Per Dr. Cyndia Bent, if INR jumps up quickly he is high risk to bleed around his heart. Of note the patient is also on amiodarone and nutritional status not great at baseline.  Given above, dosing Coumadin  conservatively.  No signs of bleeding per RN.  Hgb low but up today. INR slightly up today at 1.5.  Goal of Therapy:  Warfarin INR 2.0-2.5 Monitor platelets by anticoagulation protocol: Yes   Plan:  Warfarin 2.5 mg PO x1 again today. Continue aspirin 325 mg daily until INR > 1.8 then drop to 81 mg x 30 days s/p HM3 Daily INR and CBC  Laurey Arrow, PharmD PGY1 Pharmacy Resident 04/10/2021  6:02 AM  Please check [email or web address] for unit-specific pharmacy phone numbers.

## 2021-04-10 NOTE — Progress Notes (Signed)
Physical Therapy Treatment Patient Details Name: Brandon [name] MRN: [id number] DOB: 10/15/1955 Today's Date: 04/10/2021   History of Present Illness Pt is a 65 y.o. male admitted 03/07/21 with acute on chronic hypoxemic respiratory failure, cardiogenic shock. CT scan which shows bronchopneumonia and no indication of ILD. Pt undergoing workup for VAD. LVAD placed 04/06/21.  Other PMH includes CHF, ankylosing spondylitis, bacteremia and  possible endocarditis s/p ICD removal, HTN, depression, arthritis.    PT Comments    Pt continues to make good progress with mobility and with management of his LVAD. Pt assisted with power module to battery transfer. He needed mod assist to perform. Pt continues to be motivated to return to independence.    Recommendations for follow up therapy are one component of a multi-disciplinary discharge planning process, led by the attending physician.  Recommendations may be updated based on patient status, additional functional criteria and insurance authorization.  Follow Up Recommendations  Home health PT     Assistance Recommended at Discharge PRN  Equipment Recommendations  None recommended by PT (already has rollator)    Recommendations for Other Services       Precautions / Restrictions Precautions Precautions: Fall;Sternal;Other (comment) Precaution Comments: LVAD; chest tube     Mobility  Bed Mobility Overal bed mobility: Needs Assistance Bed Mobility: Supine to Sit     Supine to sit: Min assist;+2 for safety/equipment     General bed mobility comments: Assist to elevate trunk into sitting. Verbal cues for technique to scoot hips to EOB without using UE's    Transfers Overall transfer level: Needs assistance Equipment used:  (EVA walker) Transfers: Sit to/from Stand Sit to Stand: Min assist;+2 safety/equipment;From elevated surface           General transfer comment: Assist to bring hips up and to stabilize. Pt used hands to  knees    Ambulation/Gait Ambulation/Gait assistance: +2 safety/equipment;Min guard Gait Distance (Feet): 290 Feet Assistive device: Ethelene Hal Gait Pattern/deviations: Step-through pattern;Decreased stride length;Trunk flexed Gait velocity: Decreased Gait velocity interpretation: 1.31 - 2.62 ft/sec, indicative of limited community ambulator   General Gait Details: Assist for safety and stability. Verbal cues to hold head up.   Stairs             Wheelchair Mobility    Modified Rankin (Stroke Patients Only)       Balance Overall balance assessment: Needs assistance Sitting-balance support: Feet supported;No upper extremity supported Sitting balance-Leahy Scale: Fair     Standing balance support: Bilateral upper extremity supported;Reliant on assistive device for balance Standing balance-Leahy Scale: Poor Standing balance comment: Eva walker and min guard assist for static standing                            Cognition Arousal/Alertness: Awake/alert Behavior During Therapy: WFL for tasks assessed/performed Overall Cognitive Status: Within Functional Limits for tasks assessed                                          Exercises      General Comments General comments (skin integrity, edema, etc.): Pt on 3L O2 with SpO2 > 95%      Pertinent Vitals/Pain Pain Assessment: Faces Faces Pain Scale: No hurt    Home Living  Prior Function            PT Goals (current goals can now be found in the care plan section) Acute Rehab PT Goals Patient Stated Goal: return to walking without a device Progress towards PT goals: Progressing toward goals    Frequency    Min 3X/week      PT Plan Discharge plan needs to be updated    Co-evaluation              AM-PAC PT "6 Clicks" Mobility   Outcome Measure    Help needed moving from lying on your back to sitting on the side of a flat bed  without using bedrails?: A Little Help needed moving to and from a bed to a chair (including a wheelchair)?: A Little Help needed standing up from a chair using your arms (e.g., wheelchair or bedside chair)?: A Little Help needed to walk in hospital room?: A Little Help needed climbing 3-5 steps with a railing? : A Little 6 Click Score: 15    End of Session Equipment Utilized During Treatment: Oxygen Activity Tolerance: Patient tolerated treatment well Patient left: in chair;with call bell/phone within reach Nurse Communication: Mobility status PT Visit Diagnosis: Difficulty in walking, not elsewhere classified (R26.2);Muscle weakness (generalized) (M62.81)     Time: 6681-5947 PT Time Calculation (min) (ACUTE ONLY): 31 min  Charges:  $Gait Training: 23-37 mins                     Arabi Pager [phone] Office [place] 04/10/2021, 2:47 PM

## 2021-04-10 NOTE — Plan of Care (Signed)
  Problem: Education: Goal: Knowledge of General Education information will improve Description: Including pain rating scale, medication(s)/side effects and non-pharmacologic comfort measures Outcome: Progressing   Problem: Health Behavior/Discharge Planning: Goal: Ability to manage health-related needs will improve Outcome: Progressing   Problem: Clinical Measurements: Goal: Will remain free from infection Outcome: Progressing Goal: Diagnostic test results will improve Outcome: Progressing Goal: Respiratory complications will improve Outcome: Progressing   Problem: Activity: Goal: Risk for activity intolerance will decrease Outcome: Progressing   Problem: Nutrition: Goal: Adequate nutrition will be maintained Outcome: Progressing   Problem: Coping: Goal: Level of anxiety will decrease Outcome: Progressing   Problem: Elimination: Goal: Will not experience complications related to bowel motility Outcome: Progressing Goal: Will not experience complications related to urinary retention Outcome: Progressing   Problem: Pain Managment: Goal: General experience of comfort will improve Outcome: Progressing   Problem: Safety: Goal: Ability to remain free from injury will improve Outcome: Progressing   Problem: Skin Integrity: Goal: Risk for impaired skin integrity will decrease Outcome: Progressing

## 2021-04-10 NOTE — Consult Note (Signed)
Date of Admission:  03/07/2021          Reason for Consult: " Acute aortic valve endocarditis on histopathology from aortic valve that was replaced   Referring Provider: Pierre Bali, MD   Assessment:  Possible "acute aortic valve" endocarditis (based on pathology report) vs treated and calcified endocardial infection Prior Streptococcus gordonae native aortic valve infection +/- ICD infection sp device explant [place] followed by 6 weeks of ceftriaxone Admission with Fever and hypotension in October-- in part due to pneumonia + some component of heart failure Hx of ankylosing spondylosis previously on biologic therapy Advanced heart failure now with LVAD in place  Plan:  Check set of blood cultures now ---peripheral ones and not from central lines Send tissue to [facility] in [place] for broad 16 S ribosomal sequencing for bacterial pathogens as well as T wet Bhilai Bartonella Brucella (all latter not likely I would favor retreating for Streptococcus gordonae eye infection with ceftriaxone and I would hedge bet of different organism with oral doxycycline given my anxiety about risks of unnecessary IV antibiotics and in this case options of vancomycin and daptomycin   Principal Problem:   Acute on chronic respiratory failure with hypoxia (HCC) Active Problems:   Hyperlipidemia with target LDL less than 70   Essential hypertension   CAD (coronary artery disease)   Ischemic cardiomyopathy   Centrilobular emphysema (HCC)   Normocytic anemia   Respiratory failure (HCC)   Bacteremia   Systemic inflammatory response syndrome (SIRS) (HCC)   Chronic systolic CHF (congestive heart failure) (HCC)   Ankylosing spondylitis (HCC)   Leukocytosis   CAP (community acquired pneumonia)   ILD (interstitial lung disease) ([place])   Aspiration pneumonia of both upper lobes (HCC)   Protein-calorie malnutrition, severe   LVAD (left ventricular assist device) present ([place])   Scheduled Meds:   (feeding supplement) PROSource Plus  30 mL Oral TID PC & HS   sodium chloride   Intravenous Once   acetaminophen  1,000 mg Oral Q6H   Or   acetaminophen (TYLENOL) oral liquid 160 mg/5 mL  1,000 mg Per Tube Q6H   aspirin EC  325 mg Oral Daily   Or   aspirin  324 mg Per Tube Daily   Or   aspirin  300 mg Rectal Daily   bisacodyl  10 mg Oral Daily   Or   bisacodyl  10 mg Rectal Daily   Chlorhexidine Gluconate Cloth  6 each Topical Daily   digoxin  0.125 mg Oral Daily   docusate sodium  200 mg Oral Daily   ezetimibe  10 mg Oral Daily   feeding supplement  237 mL Oral TID BM   folic acid  1 mg Oral Daily   furosemide  80 mg Intravenous BID   gabapentin  200 mg Oral TID   insulin aspart  0-15 Units Subcutaneous Q4H   insulin detemir  15 Units Subcutaneous Daily   mouth rinse  15 mL Mouth Rinse BID   metoCLOPramide (REGLAN) injection  10 mg Intravenous Q6H   midodrine  15 mg Oral TID WC   multivitamin with minerals  1 tablet Oral Daily   pantoprazole  40 mg Oral Daily   polyethylene glycol  17 g Oral Daily   sertraline  100 mg Oral Daily   sodium chloride flush  10-40 mL Intracatheter Q12H   sodium chloride flush  10-40 mL Intracatheter Q12H   sodium chloride flush  3 mL Intravenous Q12H  tamsulosin  0.4 mg Oral Daily   vitamin B-12  100 mcg Oral Daily   warfarin  2.5 mg Oral ONCE-1600   Warfarin - Pharmacist Dosing Inpatient   Does not apply q1600   Continuous Infusions:  sodium chloride Stopped (04/07/21 0943)   sodium chloride     sodium chloride Stopped (04/09/21 2237)   amiodarone 30 mg/hr (04/10/21 1400)   epinephrine 3 mcg/min (04/10/21 1400)   furosemide (LASIX) 200 mg in dextrose 5% 100 mL (71m/mL) infusion Stopped (04/10/21 0215)   lactated ringers Stopped (04/10/21 0526)   lactated ringers 20 mL/hr at 04/06/21 2130   milrinone 0.125 mcg/kg/min (04/10/21 1400)   norepinephrine (LEVOPHED) Adult infusion 10 mcg/min (04/10/21 1400)   PRN Meds:.sodium chloride,  diphenhydrAMINE, melatonin, morphine injection, ondansetron (ZOFRAN) IV, oxyCODONE, polyvinyl alcohol, sodium chloride flush, sodium chloride flush, sodium chloride flush, traMADol  HPI: Brandon ERRINGTONis a 65y.o. male who I am quite familiar with who has a history of ankylosing spondylitis and previously had been on biologic therapy but also has had advanced heart failure and aortic stenosis with a history of Bero stem and ICD placement.  I met Brandon [name] summer when he was admitted with septic shock + cardiogenic shock from streptococcus gordonae bacteremia.  At that time his transesophageal echocardiogram had shown what could be a fibro-Elastomull versus a vegetation on the aortic valve.  There were no vegetations seen on his ICD but it was extracted.  He received 6 weeks of ceftriaxone post ICD extraction and was seen by myself in infectious disease clinic on November 20.  CT chest abdomen pelvis at that time showed no evidence for a source of his bacteremia.  He had a CT maxillofacial that also did not show significant pathology at that time.  At that point in time I checked surveillance blood cultures which were without growth.  In October he was admitted with acute on chronic respiratory failure and shock with fevers.  Blood cultures were taken at that time and he was initially on broad-spectrum antibiotics in the form of vancomycin and cefepime.  His chest x-ray and CT scan at that time had shown emphysematous changes as well as a pleural effusion and some areas concerning for possible multifocal pneumonia.  His blood cultures for that admission were without growth.  Vancomycin was initially stopped after 3 days of therapy and cefepime stopped the day 4.  He had been afebrile off antibiotics and done well though I later gave him a course of 7 days of Augmentin to err on side of  not under-treating a man in whom there was plans for LVAD placement.  Patient in fact was admitted now  electively for LVAD placement and aortic valve replacement.  He was taken to the operating room on November 25 and underwent median sternotomy with extracorporeal circulation, with implantation of a HeartMate 3 LVAD as well as aortic valve replacement.   Clinically he has not had no evidence recent  for infection or active endocarditis in particular.  However  the aortic valve when examined by pathology was read as being insistent with "acute infectious endocarditis."  I called the pathologist who read this but he was out sick and Dr. BMelina Copafrom Pathology took another look at the specimen.  The reason that this is being called acute infection is that there is an abundance of PMN's within the heart valve tissue--though this is itself heavily calcified.  Dr. BMelina Copasaid that there is no way to tell  if these PMNs are new and infiltrated recently vs having been walled off by calcification. She said the PMN infiltration was "in your face" and "not subtle.  My clinical suspicion is that the PMN's have been walled off by calcium from prior infection. That being said given the fact the patient has just had aortic valve replacement and placement of an LVAD, and given the consequences of not treating a potential active endovascular infection in this particular patient I would err on the side of treating the patient.  This was also the opinion of our ID pharmacists Jimmy Footman, Pharm D, Alycia Rossetti, MD, as well as that of my partners Leanora Cover, MD, Carlyle Basques, MD and Michel Bickers MD.  There is consensus to use rocephin to target the known streptococcus gordanae that was present before but a few who wanted to add coverage a bit broader to include MR staph epidermidis for example with daptomycin or vancomycin.  My own personal preference would be to use ceftriaxone but with doxycycline orally to hedge the Staph epidermidis possibility.  I will order blood cultures now and send valve to [facility] for PCR bacterial patholgens as well as Bartonella, Brucella, Legionella and T whippelei.  I spent 130 minutes with the patient including than 50% of the time in face to face counseling of the patient regarding his treatment history for endocarditis and ICD infection, his admission with pneumonia and heart failure, the findings on pathology his chest x-ray CBC CMP prior positive negative blood cultures personally reviewing along with review of medical records in preparation for the visit and during the visit and in coordination of his care with Dr [name], Dr Melina Copa with pathology and my partners Dr. Candiss Norse,       Review of Systems: Review of Systems  Constitutional:  Negative for chills, fever, malaise/fatigue and weight loss.  HENT:  Negative for congestion and sore throat.   Eyes:  Negative for blurred vision and photophobia.  Respiratory:  Negative for cough, shortness of breath and wheezing.   Cardiovascular:  Negative for chest pain, palpitations and leg swelling.  Gastrointestinal:  Negative for abdominal pain, blood in stool, constipation, diarrhea, heartburn, melena, nausea and vomiting.  Genitourinary:  Negative for dysuria, flank pain and hematuria.  Musculoskeletal:  Negative for back pain, falls, joint pain and myalgias.  Skin:  Negative for itching and rash.  Neurological:  Negative for dizziness, focal weakness, loss of consciousness, weakness and headaches.  Endo/Heme/Allergies:  Does not bruise/bleed easily.  Psychiatric/Behavioral:  Negative for depression, substance abuse and suicidal ideas. The patient does not have insomnia.    Past Medical History:  Diagnosis Date   AICD (automatic cardioverter/defibrillator) present 08/31/2019   Ankylosing spondylitis ([place])    Arthritis    BENIGN PROSTATIC HYPERTROPHY 06/07/2008   CHF (congestive heart failure) ([place])    COLONIC POLYPS, HX OF 06/07/2008   Depression    H/O hiatal hernia    Heart failure ([place])     HYPERLIPIDEMIA 06/07/2008   HYPERTENSION 06/07/2008   Myocardial infarction [facility]) 2005   NSTEMI, s/p LAD stent   NEPHROLITHIASIS, HX OF 06/07/2008   STEMI (ST elevation myocardial infarction) ([place]) 04/27/2019    Social History   Tobacco Use   Smoking status: Former    Packs/day: 1.00    Years: 40.00    Pack years: 40.00    Types: Cigarettes    Quit date: 07/26/2019    Years since quitting: 1.7   Smokeless tobacco: Never  Tobacco comments:    1-1.5 pks per year x 40-45 yrs  Vaping Use   Vaping Use: Never used  Substance Use Topics   Alcohol use: Yes    Alcohol/week: 7.0 standard drinks    Types: 7 Shots of liquor per week    Comment: "2 vodka tonics a night"   Drug use: Yes    Comment: occasionally - CBD oil    Family History  Problem Relation Age of Onset   Depression Father    Arthritis Sister        RA   Heart failure Mother    Other Neg Hx    Colon cancer Neg Hx    Esophageal cancer Neg Hx    Rectal cancer Neg Hx    Stomach cancer Neg Hx    Colon polyps Neg Hx    No Active Allergies  OBJECTIVE: Blood pressure (!) 129/106, pulse (!) 108, temperature 98.5 F (36.9 C), temperature source Oral, resp. rate 19, height 6' (1.829 m), weight 83.4 kg, SpO2 96 %.  Physical Exam Constitutional:      Appearance: He is well-developed.  HENT:     Head: Normocephalic and atraumatic.  Eyes:     Extraocular Movements: Extraocular movements intact.     Conjunctiva/sclera: Conjunctivae normal.     Pupils: Pupils are equal, round, and reactive to light.  Cardiovascular:     Rate and Rhythm: Normal rate and regular rhythm.     Heart sounds: No murmur heard.   No friction rub. No gallop.  Pulmonary:     Effort: Pulmonary effort is normal. No respiratory distress.     Breath sounds: No stridor. No wheezing or rhonchi.  Abdominal:     General: Bowel sounds are normal. There is no distension.     Palpations: Abdomen is soft.  Musculoskeletal:        General: No  tenderness. Normal range of motion.     Cervical back: Normal range of motion and neck supple.  Skin:    General: Skin is warm and dry.     Coloration: Skin is not pale.     Findings: No erythema or rash.  Neurological:     General: No focal deficit present.     Mental Status: He is alert and oriented to person, place, and time.  Psychiatric:        Mood and Affect: Mood normal.        Behavior: Behavior normal.        Thought Content: Thought content normal.        Judgment: Judgment normal.    Lab Results Lab Results  Component Value Date   WBC 13.1 (H) 04/10/2021   HGB 8.5 (L) 04/10/2021   HCT 25.0 (L) 04/10/2021   MCV 80.0 04/10/2021   PLT 187 04/10/2021    Lab Results  Component Value Date   CREATININE 0.58 (L) 04/10/2021   BUN 15 04/10/2021   NA 132 (L) 04/10/2021   K 3.2 (L) 04/10/2021   CL 92 (L) 04/10/2021   CO2 29 04/10/2021    Lab Results  Component Value Date   ALT 29 04/09/2021   AST 34 04/09/2021   ALKPHOS 78 04/09/2021   BILITOT 1.6 (H) 04/09/2021     Microbiology: Recent Results (from the past 240 hour(s))  Surgical PCR screen     Status: None   Collection Time: 04/02/21 12:30 PM   Specimen: Nasal Mucosa; Nasal Swab  Result Value Ref Range Status  [name], PCR NEGATIVE NEGATIVE Final   Staphylococcus aureus NEGATIVE NEGATIVE Final    Comment: (NOTE) The Xpert SA Assay (FDA approved for NASAL specimens in patients 81 years of age and older), is one component of a comprehensive surveillance program. It is not intended to diagnose infection nor to guide or monitor treatment. Performed at [facility], [place] [address], [place], [place] [address]     Alcide Evener, [place] [facility] [phone] pager  04/10/2021, 2:17 PM

## 2021-04-10 NOTE — Progress Notes (Signed)
Nutrition Follow-up  DOCUMENTATION CODES:   Severe malnutrition in context of chronic illness  INTERVENTION:   -Continue Ensure Enlive po TID, each supplement provides 350 kcal and 20 grams of protein  -Continue 30 ml Prosource Plus QID, each supplement provides 100 kcals and 15 grams protein -Continue MVI with minerals daily   NUTRITION DIAGNOSIS:   Severe Malnutrition related to chronic illness (CHF) as evidenced by severe muscle depletion, severe fat depletion.  Being addressed   GOAL:   Patient will meet greater than or equal to 90% of their needs  Progressing  MONITOR:   PO intake, Supplement acceptance, Weight trends, Labs, I & O's  REASON FOR ASSESSMENT:   Consult Enteral/tube feeding initiation and management, Assessment of nutrition requirement/status  ASSESSMENT:   65 y.o. male presented to the ED with sudden onset of dyspnea. PMH includes CAD, CHF with ICD, HTN, and on 2L [place] at baseline. Pt admitted with respiratory failure with hypoxia with SIRS.  11/25 HM-3 LVAD plaecment and biprosthetic AVR, persistnent 11/26 Extubated  Pt alert on visit today, sitting up in bed, in good spirits with very positive attitude. Remains on levophed, epinephrine  Pt reports appetite is improving. No recorded po intake since 11/24 but pt reports eating 100% of breakfast this AM. Pt already drank 1 Ensure thus far today and reports he is drinking about 3 per day. Pt is also taking his Pro-Source supplements as ordered  +BM x 2 post sorbitol  Labs: sodium 132 (L), potassium 3.2 (L) Meds: lasix drip, ss novolog, levemir, reglan, MVI with Minerals, miralax, B-12  Diet Order:   Diet Order             Diet heart healthy/carb modified Room service appropriate? Yes; Fluid consistency: Thin; Fluid restriction: 1800 mL Fluid  Diet effective 1000                   EDUCATION NEEDS:   No education needs have been identified at this time  Skin:  Skin Assessment: Reviewed  RN Assessment  Last BM:  04/01/21  Height:   Ht Readings from Last 1 Encounters:  03/07/21 6' (1.829 m)    Weight:   Wt Readings from Last 1 Encounters:  04/10/21 83.4 kg    Ideal Body Weight:  80.9 kg  BMI:  Body mass index is 24.94 kg/m.  Estimated Nutritional Needs:   Kcal:  2300-2500  Protein:  115-130 grams  Fluid:  >/= 2 L   Kerman Passey MS, RDN, LDN, CNSC Registered Dietitian III Clinical Nutrition RD Pager and On-Call Pager Number Located in [place]

## 2021-04-11 DIAGNOSIS — J9621 Acute and chronic respiratory failure with hypoxia: Secondary | ICD-10-CM | POA: Diagnosis not present

## 2021-04-11 DIAGNOSIS — I2510 Atherosclerotic heart disease of native coronary artery without angina pectoris: Secondary | ICD-10-CM | POA: Diagnosis not present

## 2021-04-11 DIAGNOSIS — Z95811 Presence of heart assist device: Secondary | ICD-10-CM | POA: Diagnosis not present

## 2021-04-11 DIAGNOSIS — R7881 Bacteremia: Secondary | ICD-10-CM | POA: Diagnosis not present

## 2021-04-11 DIAGNOSIS — M456 Ankylosing spondylitis lumbar region: Secondary | ICD-10-CM | POA: Diagnosis not present

## 2021-04-11 DIAGNOSIS — Z515 Encounter for palliative care: Secondary | ICD-10-CM | POA: Diagnosis not present

## 2021-04-11 LAB — BASIC METABOLIC PANEL
Anion gap: 7 (ref 5–15)
BUN: 16 mg/dL (ref 8–23)
CO2: 32 mmol/L (ref 22–32)
Calcium: 8.1 mg/dL — ABNORMAL LOW (ref 8.9–10.3)
Chloride: 92 mmol/L — ABNORMAL LOW (ref 98–111)
Creatinine, Ser: 0.56 mg/dL — ABNORMAL LOW (ref 0.61–1.24)
GFR, Estimated: >60 mL/min (ref >60)
Glucose, Bld: 152 mg/dL — ABNORMAL HIGH (ref 70–99)
Potassium: 3.3 mmol/L — ABNORMAL LOW (ref 3.5–5.1)
Sodium: 131 mmol/L — ABNORMAL LOW (ref 135–145)

## 2021-04-11 LAB — COOXEMETRY PANEL
Carboxyhemoglobin: 1.9 % — ABNORMAL HIGH (ref 0.5–1.5)
Carboxyhemoglobin: 1.8 % — ABNORMAL HIGH (ref 0.5–1.5)
Carboxyhemoglobin: 1.9 % — ABNORMAL HIGH (ref 0.5–1.5)
Methemoglobin: 0.9 % (ref 0.0–1.5)
Methemoglobin: 0.9 % (ref 0.0–1.5)
Methemoglobin: 1 % (ref 0.0–1.5)
O2 Saturation: 50.9 %
O2 Saturation: 40.8 %
O2 Saturation: 51 %
Total hemoglobin: 9.3 g/dL — ABNORMAL LOW (ref 12.0–16.0)
Total hemoglobin: 8.4 g/dL — ABNORMAL LOW (ref 12.0–16.0)
Total hemoglobin: 9.6 g/dL — ABNORMAL LOW (ref 12.0–16.0)

## 2021-04-11 LAB — MAGNESIUM: Magnesium: 1.9 mg/dL (ref 1.7–2.4)

## 2021-04-11 LAB — CBC WITH DIFFERENTIAL/PLATELET
Abs Immature Granulocytes: 0.08 10*3/uL — ABNORMAL HIGH (ref 0.00–0.07)
Basophils Absolute: 0 10*3/uL (ref 0.0–0.1)
Basophils Relative: 0 %
Eosinophils Absolute: 0.3 10*3/uL (ref 0.0–0.5)
Eosinophils Relative: 3 %
HCT: 25.3 % — ABNORMAL LOW (ref 39.0–52.0)
Hemoglobin: 7.9 g/dL — ABNORMAL LOW (ref 13.0–17.0)
Immature Granulocytes: 1 %
Lymphocytes Relative: 8 %
Lymphs Abs: 0.8 10*3/uL (ref 0.7–4.0)
MCH: 25.8 pg — ABNORMAL LOW (ref 26.0–34.0)
MCHC: 31.2 g/dL (ref 30.0–36.0)
MCV: 82.7 fL (ref 80.0–100.0)
Monocytes Absolute: 1 10*3/uL (ref 0.1–1.0)
Monocytes Relative: 9 %
Neutro Abs: 8.8 10*3/uL — ABNORMAL HIGH (ref 1.7–7.7)
Neutrophils Relative %: 79 %
Platelets: 187 10*3/uL (ref 150–400)
RBC: 3.06 MIL/uL — ABNORMAL LOW (ref 4.22–5.81)
RDW: 20.4 % — ABNORMAL HIGH (ref 11.5–15.5)
WBC: 11 10*3/uL — ABNORMAL HIGH (ref 4.0–10.5)
nRBC: 0 % (ref 0.0–0.2)

## 2021-04-11 LAB — POCT I-STAT 7, (LYTES, BLD GAS, ICA,H+H)
Acid-Base Excess: 12 mmol/L — ABNORMAL HIGH (ref 0.0–2.0)
Bicarbonate: 35.5 mmol/L — ABNORMAL HIGH (ref 20.0–28.0)
Calcium, Ion: 1.18 mmol/L (ref 1.15–1.40)
HCT: 24 % — ABNORMAL LOW (ref 39.0–52.0)
Hemoglobin: 8.2 g/dL — ABNORMAL LOW (ref 13.0–17.0)
O2 Saturation: 97 %
Patient temperature: 98
Potassium: 3.4 mmol/L — ABNORMAL LOW (ref 3.5–5.1)
Sodium: 135 mmol/L (ref 135–145)
TCO2: 37 mmol/L — ABNORMAL HIGH (ref 22–32)
pCO2 arterial: 42.9 mmHg (ref 32.0–48.0)
pH, Arterial: 7.525 — ABNORMAL HIGH (ref 7.350–7.450)
pO2, Arterial: 82 mmHg — ABNORMAL LOW (ref 83.0–108.0)

## 2021-04-11 LAB — ECHO INTRAOPERATIVE TEE
AV Mean grad: 34 mmHg
AV Peak grad: 54.5 mmHg
Ao pk vel: 3.69 m/s
Height: 72 in
Weight: 2744.29 oz

## 2021-04-11 LAB — GLUCOSE, CAPILLARY
Glucose-Capillary: 122 mg/dL — ABNORMAL HIGH (ref 70–99)
Glucose-Capillary: 123 mg/dL — ABNORMAL HIGH (ref 70–99)
Glucose-Capillary: 124 mg/dL — ABNORMAL HIGH (ref 70–99)
Glucose-Capillary: 142 mg/dL — ABNORMAL HIGH (ref 70–99)
Glucose-Capillary: 145 mg/dL — ABNORMAL HIGH (ref 70–99)
Glucose-Capillary: 153 mg/dL — ABNORMAL HIGH (ref 70–99)

## 2021-04-11 LAB — PHOSPHORUS: Phosphorus: 3 mg/dL (ref 2.5–4.6)

## 2021-04-11 LAB — PROTIME-INR
INR: 1.6 — ABNORMAL HIGH (ref 0.8–1.2)
Prothrombin Time: 18.6 seconds — ABNORMAL HIGH (ref 11.4–15.2)

## 2021-04-11 LAB — LACTATE DEHYDROGENASE: LDH: 186 U/L (ref 98–192)

## 2021-04-11 MED ORDER — FUROSEMIDE 10 MG/ML IJ SOLN
80.0000 mg | Freq: Two times a day (BID) | INTRAMUSCULAR | Status: AC
  Administered 2021-04-11 (×2): 80 mg via INTRAVENOUS
  Filled 2021-04-11 (×2): qty 8

## 2021-04-11 MED ORDER — MAGNESIUM SULFATE 2 GM/50ML IV SOLN
2.0000 g | Freq: Once | INTRAVENOUS | Status: AC
  Administered 2021-04-11: 2 g via INTRAVENOUS
  Filled 2021-04-11: qty 50

## 2021-04-11 MED ORDER — WARFARIN SODIUM 5 MG PO TABS: 5.0000 mg | ORAL_TABLET | Freq: Once | ORAL | Status: DC

## 2021-04-11 MED ORDER — POTASSIUM CHLORIDE CRYS ER 20 MEQ PO TBCR
40.0000 meq | EXTENDED_RELEASE_TABLET | ORAL | Status: AC
  Administered 2021-04-11 (×2): 40 meq via ORAL
  Filled 2021-04-11 (×2): qty 2

## 2021-04-11 MED ORDER — EPINEPHRINE HCL 5 MG/250ML IV SOLN IN NS
2.0000 ug/min | INTRAVENOUS | Status: DC
  Administered 2021-04-11 – 2021-04-14 (×3): 2 ug/min via INTRAVENOUS
  Filled 2021-04-11 (×2): qty 250

## 2021-04-11 MED ORDER — DOXYCYCLINE HYCLATE 100 MG PO TABS
100.0000 mg | ORAL_TABLET | Freq: Two times a day (BID) | ORAL | Status: DC
  Administered 2021-04-11 – 2021-05-09 (×57): 100 mg via ORAL
  Filled 2021-04-11 (×57): qty 1

## 2021-04-11 MED ORDER — POTASSIUM CHLORIDE CRYS ER 20 MEQ PO TBCR
40.0000 meq | EXTENDED_RELEASE_TABLET | Freq: Once | ORAL | Status: AC
  Administered 2021-04-11: 40 meq via ORAL
  Filled 2021-04-11: qty 2

## 2021-04-11 MED ORDER — POTASSIUM CHLORIDE CRYS ER 20 MEQ PO TBCR
20.0000 meq | EXTENDED_RELEASE_TABLET | ORAL | Status: AC
  Administered 2021-04-11 (×3): 20 meq via ORAL
  Filled 2021-04-11 (×3): qty 1

## 2021-04-11 MED ORDER — WARFARIN SODIUM 2.5 MG PO TABS: 2.5000 mg | ORAL_TABLET | Freq: Once | ORAL | Status: DC

## 2021-04-11 MED ORDER — WARFARIN SODIUM 3 MG PO TABS
3.0000 mg | ORAL_TABLET | Freq: Once | ORAL | Status: AC
  Administered 2021-04-11: 3 mg via ORAL
  Filled 2021-04-11: qty 1

## 2021-04-11 MED ORDER — SODIUM CHLORIDE 0.9 % IV SOLN
2.0000 g | INTRAVENOUS | Status: DC
  Administered 2021-04-11 – 2021-05-09 (×29): 2 g via INTRAVENOUS
  Filled 2021-04-11 (×29): qty 20

## 2021-04-11 NOTE — Progress Notes (Addendum)
LVAD Coordinator Rounding Note:  Admitted 03/07/21 due to sepsis. Dr. [name] consulted as his HF Cardiologist.   HM III LVAD implanted on 04/06/21 by Dr. Cyndia Bent under Destination Therapy criteria. Not a transplant candidate at this time due to recurrent infections.   Patient sitting up in the recliner upon my arrival. States he is feeling ok this morning and he has already walked "the longest lap" I've walked so far.   No family currently at bedside. Pt says his sister has had the flu, and he wants to give her another day or two before asking her to come in. Says he would "rather be safe than sorry".  Dr. Cyndia Bent and Dr. [name] at bedside. Plan for pulling pacing wires and weaning pressors if possible today.    Dr. [name] discussed pathology of aortic valve that was removed during VAD implant. Evidence of infection, so plan will be for patient to remain on antibiotics for several weeks. Pt verbalized understanding of same.   VAD speed increased to 5600 per Dr. [name].  Pt education as documented below.   Vital signs: Temp: 98.3 HR: 103 ST [place]:  89/69 (80) O2 Sat: 100% on 3 L/[place] Wt:171.5>190>187.4>187>183.8>184 lbs     LVAD interrogation reveals:  Speed: 5600 Flow: 4.7 Power:  4.3w PI: 3.9 Alarms: none Events:  none Hematocrit: 24  Fixed speed: 5600 Low speed limit: 5300  Drive Line: Existing VAD dressing removed and site care performed using sterile technique. Drive line exit site cleaned with Chlora prep applicators x 2, allowed to dry, and gauze dressing with silver strip applied. Exit site unincorporated with suture intact, the velour is fully implanted at exit site. Slight redness around sutures noted. Small amount fat necrosis drainage, no tenderness, foul odor or rash noted. Drive line anchor re-applied. Daily dressings per VAD Coordinator, Nurse Davonna Belling, or trained caregiver. Next dressing change due 04/12/21.    Labs:  LDH trend:  309>248>215>206>201  INR trend: 1.6>1.3>1.3>1.4>1.5>1.6  Anticoagulation Plan: -INR Goal: 2 - 2.5 -ASA Dose: 325 mg daily until INR therapeutic  Blood Products:   Intra-op: - 04/06/21>>4 PRBCs, 4 FFP, 1263 cc cell saver  Post-op: - 04/06/21>>1 unit Plts - 04/07/21>>1 unit PRBC  Device: N/A  Arrythmias: 03/22/21 - developed Afib w/ RVR, converted with IV amiodarone  Respiratory: extubated 04/07/21  Nitric Oxide: off 04/07/21  Gtts: - Epinephrine>>3 mcg/min - Milrinone>>0.125 mcg/kg/min - Levophed>>8 mcg/min - Amiodarone>>30 mg/hr  Infection: - 04/11/21 Blood cultures x 2>>pending  Patient/Family Teaching:  No family at bedside today.  Encouraged pt to continue reviewing HM III Patient Handbook  prior to formal discharge teaching. Pt verbalized understanding of same.  Demonstrated and verbalized dressing change with patient. Discussed importance of sterile technique, sterile gloves, cap, and anchor device.   Plan/Recommendations:  1. Call VAD Coordinator for any VAD equipment or drive line issues. 2.  Daily dressing changes per VAD Coordinator, Nurse Davonna Belling, or trained caregiver.   Zada Girt RN [place] Coordinator  Office: [phone]  24/7 Pager: [phone]

## 2021-04-11 NOTE — Progress Notes (Signed)
PHARMACY CONSULT NOTE FOR:  OUTPATIENT  PARENTERAL ANTIBIOTIC THERAPY (OPAT)  Indication: Possible endocarditis Regimen: Ceftriaxone 2g IV every 24h + Doxycycline 100 mg po bid End date: 05/22/21  IV antibiotic discharge orders are pended. To discharging provider:  please sign these orders via discharge navigator,  Select New Orders & click on the button choice - Manage This Unsigned Work.     Thank you for allowing pharmacy to be a part of this patient's care.  Alycia Rossetti, PharmD, BCPS Clinical Pharmacist 04/11/2021 9:22 AM   **Pharmacist phone directory can now be found on [email or web address] ([email or web address]).  Listed under [place].

## 2021-04-11 NOTE — TOC CM/SW Note (Signed)
HF TOC CM contacted [place] IV infusion coordinator for possible home IV abx. Pt has [place] for [facility]. She will contact [facility] rep, [place] to follow for possible HH with IV abx. [place], Heart Failure TOC CM [phone]

## 2021-04-11 NOTE — Progress Notes (Signed)
Patient ID: Brandon [name], male   DOB: 22-Dec-1955, 65 y.o.   MRN: [id number] TCTS Evening Rounds:  Hemodynamically stable on milrinone 0.125 and epi 3. Titrating NE for MAP.  Good urine output with lasix.  Ambulated around the ICU today.  Feels stronger and eating better.

## 2021-04-11 NOTE — Plan of Care (Signed)
  Problem: Pain Managment: Goal: General experience of comfort will improve Outcome: Progressing   Problem: Education: Goal: Knowledge of the prescribed therapeutic regimen will improve Outcome: Progressing   Problem: Activity: Goal: Risk for activity intolerance will decrease Outcome: Progressing   Problem: Cardiac: Goal: Ability to maintain an adequate cardiac output will improve Outcome: Progressing   Problem: Coping: Goal: Level of anxiety will decrease Outcome: Progressing   Problem: Fluid Volume: Goal: Risk for excess fluid volume will decrease Outcome: Progressing   Problem: Clinical Measurements: Goal: Ability to maintain clinical measurements within normal limits will improve Outcome: Progressing

## 2021-04-11 NOTE — Progress Notes (Addendum)
Subjective: No new complaints   Antibiotics:  Anti-infectives (From admission, onward)    Start     Dose/Rate Route Frequency Ordered Stop   04/11/21 1000  cefTRIAXone (ROCEPHIN) 2 g in sodium chloride 0.9 % 100 mL IVPB        2 g 200 mL/hr over 30 Minutes Intravenous Every 24 hours 04/11/21 0858     04/11/21 1000  doxycycline (VIBRA-TABS) tablet 100 mg        100 mg Oral Every 12 hours 04/11/21 0858     04/07/21 0800  rifampin (RIFADIN) 600 mg in sodium chloride 0.9 % 100 mL IVPB        600 mg 200 mL/hr over 30 Minutes Intravenous  Once 04/06/21 1534 04/07/21 0841   04/07/21 0600  fluconazole (DIFLUCAN) IVPB 400 mg        400 mg 100 mL/hr over 120 Minutes Intravenous  Once 04/06/21 1534 04/07/21 0830   04/06/21 2330  vancomycin (VANCOCIN) IVPB 1000 mg/200 mL premix  Status:  Discontinued        1,000 mg 200 mL/hr over 60 Minutes Intravenous Every 12 hours 04/06/21 1534 04/06/21 1541   04/06/21 2000  vancomycin (VANCOREADY) IVPB 1000 mg/200 mL        1,000 mg 200 mL/hr over 60 Minutes Intravenous Every 12 hours 04/06/21 1552 04/08/21 0827   04/06/21 1930  ceFAZolin (ANCEF) IVPB 2g/100 mL premix        2 g 200 mL/hr over 30 Minutes Intravenous Every 8 hours 04/06/21 1534 04/08/21 1335   04/06/21 1024  vancomycin (VANCOCIN) powder  Status:  Discontinued          As needed 04/06/21 1024 04/06/21 1501   04/06/21 0600  rifampin (RIFADIN) 600 mg in sodium chloride 0.9 % 100 mL IVPB  Status:  Discontinued        600 mg 200 mL/hr over 30 Minutes Intravenous On call to O.R. 04/05/21 2009 04/06/21 1517   04/06/21 0400  vancomycin (VANCOREADY) IVPB 1250 mg/250 mL  Status:  Discontinued        1,250 mg 166.7 mL/hr over 90 Minutes Intravenous To Surgery 04/04/21 0904 04/04/21 1412   04/06/21 0400  ceFAZolin (ANCEF) IVPB 2g/100 mL premix  Status:  Discontinued        2 g 200 mL/hr over 30 Minutes Intravenous To Surgery 04/04/21 0904 04/04/21 1412   04/06/21 0400  ceFAZolin  (ANCEF) IVPB 2g/100 mL premix  Status:  Discontinued        2 g 200 mL/hr over 30 Minutes Intravenous To Surgery 04/04/21 0904 04/04/21 1412   04/06/21 0400  vancomycin (VANCOREADY) IVPB 1250 mg/250 mL        1,250 mg 166.7 mL/hr over 90 Minutes Intravenous To Surgery 04/04/21 1418 04/06/21 0945   04/06/21 0400  ceFAZolin (ANCEF) IVPB 2g/100 mL premix        2 g 200 mL/hr over 30 Minutes Intravenous To Surgery 04/04/21 1418 04/06/21 1331   04/06/21 0400  ceFAZolin (ANCEF) IVPB 2g/100 mL premix  Status:  Discontinued        2 g 200 mL/hr over 30 Minutes Intravenous To Surgery 04/04/21 1418 04/06/21 1517   04/06/21 0400  fluconazole (DIFLUCAN) IVPB 400 mg        400 mg 100 mL/hr over 120 Minutes Intravenous To Surgery 04/04/21 1418 04/06/21 0815   04/06/21 0400  rifampin (RIFADIN) 600 mg in sodium chloride 0.9 % 100 mL IVPB  600 mg 200 mL/hr over 30 Minutes Intravenous To Surgery 04/04/21 1418 04/06/21 0830   04/06/21 0400  vancomycin (VANCOCIN) powder 1,000 mg  Status:  Discontinued        1,000 mg Other To Surgery 04/04/21 1418 04/06/21 1517   03/12/21 1000  amoxicillin-clavulanate (AUGMENTIN) 875-125 MG per tablet 1 tablet        1 tablet Oral Every 12 hours 03/12/21 0832 03/19/21 0935   03/08/21 0700  vancomycin (VANCOREADY) IVPB 1250 mg/250 mL  Status:  Discontinued        1,250 mg 166.7 mL/hr over 90 Minutes Intravenous Every 12 hours 03/07/21 1801 03/09/21 1320   03/08/21 0000  ceFEPIme (MAXIPIME) 2 g in sodium chloride 0.9 % 100 mL IVPB  Status:  Discontinued        2 g 200 mL/hr over 30 Minutes Intravenous Every 8 hours 03/07/21 1800 03/10/21 1201   03/07/21 1500  vancomycin (VANCOREADY) IVPB 1500 mg/300 mL        1,500 mg 150 mL/hr over 120 Minutes Intravenous  Once 03/07/21 1455 03/08/21 0118   03/07/21 1500  ceFEPIme (MAXIPIME) 2 g in sodium chloride 0.9 % 100 mL IVPB        2 g 200 mL/hr over 30 Minutes Intravenous  Once 03/07/21 1455 03/07/21 1628        Medications: Scheduled Meds:  (feeding supplement) PROSource Plus  30 mL Oral TID PC & HS   sodium chloride   Intravenous Once   acetaminophen  1,000 mg Oral Q6H   Or   acetaminophen (TYLENOL) oral liquid 160 mg/5 mL  1,000 mg Per Tube Q6H   aspirin EC  325 mg Oral Daily   Or   aspirin  324 mg Per Tube Daily   Or   aspirin  300 mg Rectal Daily   bisacodyl  10 mg Oral Daily   Or   bisacodyl  10 mg Rectal Daily   Chlorhexidine Gluconate Cloth  6 each Topical Daily   digoxin  0.125 mg Oral Daily   docusate sodium  200 mg Oral Daily   doxycycline  100 mg Oral Q12H   ezetimibe  10 mg Oral Daily   feeding supplement  237 mL Oral TID BM   folic acid  1 mg Oral Daily   furosemide  80 mg Intravenous BID   gabapentin  200 mg Oral TID   insulin aspart  0-15 Units Subcutaneous Q4H   insulin detemir  15 Units Subcutaneous Daily   mouth rinse  15 mL Mouth Rinse BID   midodrine  15 mg Oral TID WC   multivitamin with minerals  1 tablet Oral Daily   pantoprazole  40 mg Oral Daily   polyethylene glycol  17 g Oral Daily   potassium chloride  20 mEq Oral Q4H   potassium chloride  40 mEq Oral Once   sertraline  100 mg Oral Daily   sodium chloride flush  10-40 mL Intracatheter Q12H   sodium chloride flush  10-40 mL Intracatheter Q12H   sodium chloride flush  3 mL Intravenous Q12H   tamsulosin  0.4 mg Oral Daily   vitamin B-12  100 mcg Oral Daily   warfarin  5 mg Oral ONCE-1600   Warfarin - Pharmacist Dosing Inpatient   Does not apply q1600   Continuous Infusions:  sodium chloride Stopped (04/07/21 0943)   sodium chloride     sodium chloride Stopped (04/09/21 2237)   amiodarone 30 mg/hr (04/11/21 1300)  cefTRIAXone (ROCEPHIN)  IV Stopped (04/11/21 1047)   epinephrine 2 mcg/min (04/11/21 1300)   lactated ringers Stopped (04/10/21 0526)   milrinone 0.125 mcg/kg/min (04/11/21 1300)   norepinephrine (LEVOPHED) Adult infusion 7 mcg/min (04/11/21 1300)   PRN Meds:.sodium chloride,  diphenhydrAMINE, melatonin, morphine injection, ondansetron (ZOFRAN) IV, oxyCODONE, polyvinyl alcohol, sodium chloride flush, sodium chloride flush, traMADol    Objective: Weight change: 0.2 kg  Intake/Output Summary (Last 24 hours) at 04/11/2021 1322 Last data filed at 04/11/2021 1300 Gross per 24 hour  Intake 2427.44 ml  Output 4120 ml  Net -1692.56 ml   Blood pressure 101/79, pulse (!) 105, temperature 97.8 F (36.6 C), temperature source Axillary, resp. rate (!) 25, height 6' (1.829 m), weight 83.6 kg, SpO2 97 %. Temp:  [97.8 F (36.6 C)-98.9 F (37.2 C)] 97.8 F (36.6 C) (11/30 1234) Pulse Rate:  [57-116] 105 (11/30 1300) Resp:  [10-32] 25 (11/30 1300) BP: (80-101)/(64-79) 101/79 (11/30 0400) SpO2:  [92 %-100 %] 97 % (11/30 1300) Arterial Line BP: (68-259)/(51-254) 87/70 (11/30 1300) Weight:  [83.6 kg] 83.6 kg (11/30 0500)  Physical Exam: Physical Exam Constitutional:      Appearance: He is well-developed.  HENT:     Head: Normocephalic and atraumatic.  Eyes:     Conjunctiva/sclera: Conjunctivae normal.  Cardiovascular:     Rate and Rhythm: Normal rate and regular rhythm.  Pulmonary:     Effort: Pulmonary effort is normal. No respiratory distress.     Breath sounds: No wheezing.  Abdominal:     General: There is no distension.     Palpations: Abdomen is soft.  Musculoskeletal:        General: Normal range of motion.     Cervical back: Normal range of motion and neck supple.  Skin:    General: Skin is warm and dry.     Findings: No erythema or rash.  Neurological:     General: No focal deficit present.     Mental Status: He is alert and oriented to person, place, and time.  Psychiatric:        Mood and Affect: Mood normal.        Behavior: Behavior normal.        Thought Content: Thought content normal.        Judgment: Judgment normal.     CBC:    BMET Recent Labs    04/10/21 1507 04/11/21 0352 04/11/21 0403  NA 132* 131* 135  K 3.8 3.3* 3.4*   CL 92* 92*  --   CO2 32 32  --   GLUCOSE 187* 152*  --   BUN 18 16  --   CREATININE 0.59* 0.56*  --   CALCIUM 8.0* 8.1*  --      Liver Panel  Recent Labs    04/09/21 0359  PROT 5.6*  ALBUMIN 2.5*  AST 34  ALT 29  ALKPHOS 78  BILITOT 1.6*       Sedimentation Rate No results for input(s): ESRSEDRATE in the last 72 hours. C-Reactive Protein No results for input(s): CRP in the last 72 hours.  Micro Results: Recent Results (from the past 720 hour(s))  Surgical PCR screen     Status: None   Collection Time: 04/02/21 12:30 PM   Specimen: Nasal Mucosa; Nasal Swab  Result Value Ref Range Status   [name], PCR NEGATIVE NEGATIVE Final   Staphylococcus aureus NEGATIVE NEGATIVE Final    Comment: (NOTE) The Xpert SA Assay (FDA approved for NASAL specimens in  patients 41 years of age and older), is one component of a comprehensive surveillance program. It is not intended to diagnose infection nor to guide or monitor treatment. Performed at [facility], [place] [address] [place], [place] [address]     Studies/Results: [place] CHEST PORT 1 VIEW  Result Date: 04/09/2021 CLINICAL DATA:  Central line placement EXAM: PORTABLE CHEST 1 VIEW COMPARISON:  04/09/2021, CT 03/11/2021, radiograph 04/07/2021, 04/06/2021, 03/21/2021 FINDINGS: Right-sided stimulator generator with ascending lead. Post sternotomy changes with valve prosthesis and partially visible LVAD. Left-sided central venous catheter tip superior to the aortic arch. Interval placement of right upper extremity central venous catheter with the tip at the cavoatrial region. Cardiomegaly with emphysema. Diffuse bilateral interstitial and ground-glass opacity may be due to superimposed pneumonia or edema. There are small pleural effusions. Aeration may be slightly improved at the right base compared to yesterday. IMPRESSION: 1. Interim placement of right upper extremity central venous catheter with tip visualized over the cavoatrial  region. Left IJ sheath remains in place. 2. Cardiomegaly with vascular congestion and diffuse bilateral interstitial and ground-glass opacity likely due to edema or pneumonia superimposed on underlying emphysema. Small bilateral effusions with persistent consolidation left lung base. Aeration may be slightly improved at the right lung base. Electronically Signed   By: Donavan Foil M.D.   On: 04/09/2021 23:17      Assessment/Plan:  INTERVAL HISTORY: Ceftriaxone and doxycycline started this am   Principal Problem:   Acute on chronic respiratory failure with hypoxia (HCC) Active Problems:   Hyperlipidemia with target LDL less than 70   Essential hypertension   CAD (coronary artery disease)   Ischemic cardiomyopathy   Centrilobular emphysema (HCC)   Normocytic anemia   Respiratory failure (HCC)   Bacteremia   Systemic inflammatory response syndrome (SIRS) (HCC)   Chronic systolic CHF (congestive heart failure) (HCC)   Ankylosing spondylitis (HCC)   Leukocytosis   CAP (community acquired pneumonia)   ILD (interstitial lung disease) ([place])   Aspiration pneumonia of both upper lobes (HCC)   Protein-calorie malnutrition, severe   LVAD (left ventricular assist device) present ([place])    Brandon [name] is a 65 y.o. male I met in July when he was diagnosed with Streptococcus gordonae bacteremia and sepsis.  At that time transesophageal echocardiogram and suggested possible aortic valve vegetation.  He has had a history of aortic stenosis and also a history of a Baro-stim, and ICD with advanced heart failure.  At that time his ICD was extracted and he received 6 weeks of post extraction IV ceftriaxone.  Surveillance cultures were -2 weeks after that.  He did have an episode of fevers and likely a combination of cardiogenic shock +/- septis in October when he was treated for PNA (blood cultures again negative)  He has now had implantation of an LVAD as well as aortic valve replacement.   Pathology that was reviewed by 2 different pathologist with me yesterday has findings concerning for acute endocarditis with an abundance of neutrophils infiltrating the valvular tissue.  There is also fair amount of calcification of this tissue.  This latter feature along with his clinical history raises the question of whether he has actual active infection or not currently.  That being said given his LVAD implantation and new aortic valve it is clearly safer in my opinion to give him treatment again.  We will plan on 6 weeks of IV ceftriaxone to cover the Streptococcus gordonae that was isolated before.  We did  obtain blood cultures that were drawn last night in hopes that these might yield an organism though I doubt it.  I am also sending his tissue to the numbers to [facility] for 16 S ribosomal sequencing by PCR.  Diagnosis: Aortic valve endocarditis  Culture Result: No new cultures yet  No Active Allergies  OPAT Orders Discharge antibiotics to be given via PICC line Ceftriaxone 2 g IV daily 6 weeks  Duration: 6 weeeks  End Date:  March 22, 2022  [facility] Care Per Protocol:  Home health RN for IV administration and teaching; PICC line care and labs.    Labs weekly while on IV antibiotics: __x CBC with differential _x_ BMP   _x_ Please pull PIC at completion of IV antibiotics __ Please leave PIC in place until doctor has seen patient or been notified  Fax weekly labs to [phone]  Clinic Follow Up Appt:   TACOMA MERIDA has an appointment on 04/23/2021 at 330PM with Dr. [name]  The [facility] is located in the [facility] at  [place] in [place].  [address], which is located to the left of the elevators.  Phone: [phone]  Fax: [phone]  [email or web address]  He should arrive 15 to 30 minutes prior to his appointment.  I spent 36 minutes with the patient  including face to face counseling of the patient regarding the results of his pathology report our decisions with regards to treatment, personally reviewing his chest x-ray updated CBC CMP culture data along with personally reviewing radiographs, along with  review of medical records before and during the visit and in coordination of his care.   I will sign off for now  Please call with further questions.    LOS: 34 days   Alcide Evener 04/11/2021, 1:22 PM

## 2021-04-11 NOTE — Progress Notes (Addendum)
Patient ID: GIBRIL MASTRO, male   DOB: [birth year], 65 y.o.   MRN: [id number]     Advanced Heart Failure Rounding Note  PCP-Cardiologist: [name], MD   Subjective:    Admitted with sepsis. Started on cefepime + vanc, now off abx (ID following). Afebrile.  10/29 Co-ox 48%, started on milrinone 0.25.  10/30 IV Antibiotics stopped. Blood cultures no growth for 5 days.  10/31 started on Augmentin for PNA.  11/1 Milrinone off 11/2 Milrinone added back. Midodrine increased to 10 mg TID 11/10 Developed Afib w/ RVR, converted with IV amio 11/14 Milrinone increased to 0.25 mcg. Diuresed with IV lasix  11/25 Underwent HM-3 VAD placement and bioprosthetic AVR. Had persistent hypotension in OR requiring methylene blue and high dose pressors.  11/26 Extubated 11/28 Started on lasix gtt>>later stopped due to low CVP (1) and increasing pressor requirements. Restarted on scheduled BID IV Lasix once pressure stabilized (still volume up)  11/28 Surgical path with evidence of possible "acute AoV endocarditis". ID consulted. Bcx recollected. Tissue sent to [facility] in [facility] on Epi 3, NE 8 and Milrinone 0.125. Co-ox resulted low at 40% (this was post walk). Repeat Co-ox pending. MAPs mid-upper 70s. CO2 32. SCr stable, 0.56.   Good response to IV Lasix, w/ 4.7L I UOP yesterday but only net negative 1.8L for the day. Wt unchanged. Remains 10 lb above pre-op wt. CVP 5-6.   Feels well today. Ate most of his breakfast and ambulated 1 lab around the unit w/ slightly fatigue.   Increased output from CT from 150>>330 cc. Hgb 8.5>>8.2   Repeat BCx pending. AF. WBC continues to trend down, 13>>11K. Denies fever/chills.   LVAD Interrogation HM 3: Speed: 5500 Flow: 4.5 PI: 3.9 Power: 4.2. No PI events. VAD interrogated personally. Parameters stable.     Objective:   Weight Range: 83.6 kg Body mass index is 25 kg/m.   Vital Signs:   Temp:  [97.8 F (36.6 C)-98.9 F (37.2 C)] 98 F (36.7 C)  (11/30 0445) Pulse Rate:  [58-116] 109 (11/30 0700) Resp:  [13-32] 18 (11/30 0700) BP: (80-101)/(64-79) 101/79 (11/30 0400) SpO2:  [92 %-100 %] 97 % (11/30 0700) Arterial Line BP: (68-259)/(51-254) 93/71 (11/30 0700) Weight:  [83.6 kg] 83.6 kg (11/30 0500) Last BM Date: 04/09/21  Weight change: Filed Weights   04/09/21 0620 04/10/21 0650 04/11/21 0500  Weight: 84.9 kg 83.4 kg 83.6 kg    Intake/Output:   Intake/Output Summary (Last 24 hours) at 04/11/2021 0750 Last data filed at 04/11/2021 0700 Gross per 24 hour  Intake 3266.52 ml  Output 5070 ml  Net -1803.48 ml     Physical Exam   CVP 5-6  General:  Well appearing, sitting up in chair. No distress  HEENT: normal  Neck: supple. JVD 6 cm  Cor: sternal wound ok LVAD hum. + CT Lungs: faint bibasilar crackles bilaterally  Abdomen: soft, nontender, non-distended. No hepatosplenomegaly. No bruits or masses. Good bowel sounds. Driveline site clean. Anchor in place.  Extremities: no cyanosis, clubbing, rash. Warm 1+ bilateral ankle edema, TED hoses, + RUE PICC  Neuro: alert & oriented x 3. No focal deficits. Moves all 4 without problem  GU: + Foley   MAPs: mid-upper 70s  Telemetry   NSR 90s Personally reviewed   Labs    CBC Recent Labs    04/10/21 0233 04/10/21 0425 04/11/21 0352 04/11/21 0403  WBC 13.1*  --  11.0*  --   NEUTROABS 11.1*  --  8.8*  --   HGB 7.9*   < > 7.9* 8.2*  HCT 24.4*   < > 25.3* 24.0*  MCV 80.0  --  82.7  --   PLT 187  --  187  --    < > = values in this interval not displayed.   Basic Metabolic Panel Recent Labs    04/10/21 1507 04/11/21 0352 04/11/21 0403  NA 132* 131* 135  K 3.8 3.3* 3.4*  CL 92* 92*  --   CO2 32 32  --   GLUCOSE 187* 152*  --   BUN 18 16  --   CREATININE 0.59* 0.56*  --   CALCIUM 8.0* 8.1*  --   MG 2.0 1.9  --   PHOS 2.7 3.0  --    Liver Function Tests Recent Labs    04/09/21 0359  AST 34  ALT 29  ALKPHOS 78  BILITOT 1.6*  PROT 5.6*  ALBUMIN 2.5*     No results for input(s): LIPASE, AMYLASE in the last 72 hours. Cardiac Enzymes No results for input(s): CKTOTAL, CKMB, CKMBINDEX, TROPONINI in the last 72 hours.  BNP: BNP (last 3 results) Recent Labs    01/08/21 0731 01/18/21 1629 04/07/21 0500  BNP 734.2* 607.9* 663.2*    ProBNP (last 3 results) No results for input(s): PROBNP in the last 8760 hours.    D-Dimer No results for input(s): DDIMER in the last 72 hours. Hemoglobin A1C No results for input(s): HGBA1C in the last 72 hours.  Fasting Lipid Panel No results for input(s): CHOL, HDL, LDLCALC, TRIG, CHOLHDL, LDLDIRECT in the last 72 hours. Thyroid Function Tests No results for input(s): TSH, T4TOTAL, T3FREE, THYROIDAB in the last 72 hours.  Invalid input(s): FREET3  Other results:   Imaging    No results found.   Medications:     Scheduled Medications:  (feeding supplement) PROSource Plus  30 mL Oral TID PC & HS   sodium chloride   Intravenous Once   acetaminophen  1,000 mg Oral Q6H   Or   acetaminophen (TYLENOL) oral liquid 160 mg/5 mL  1,000 mg Per Tube Q6H   aspirin EC  325 mg Oral Daily   Or   aspirin  324 mg Per Tube Daily   Or   aspirin  300 mg Rectal Daily   bisacodyl  10 mg Oral Daily   Or   bisacodyl  10 mg Rectal Daily   Chlorhexidine Gluconate Cloth  6 each Topical Daily   digoxin  0.125 mg Oral Daily   docusate sodium  200 mg Oral Daily   ezetimibe  10 mg Oral Daily   feeding supplement  237 mL Oral TID BM   folic acid  1 mg Oral Daily   gabapentin  200 mg Oral TID   insulin aspart  0-15 Units Subcutaneous Q4H   insulin detemir  15 Units Subcutaneous Daily   mouth rinse  15 mL Mouth Rinse BID   metoCLOPramide (REGLAN) injection  10 mg Intravenous Q6H   midodrine  15 mg Oral TID WC   multivitamin with minerals  1 tablet Oral Daily   pantoprazole  40 mg Oral Daily   polyethylene glycol  17 g Oral Daily   potassium chloride  20 mEq Oral Q4H   potassium chloride  40 mEq Oral  Q4H   sertraline  100 mg Oral Daily   sodium chloride flush  10-40 mL Intracatheter Q12H   sodium chloride flush  10-40 mL Intracatheter  Q12H   sodium chloride flush  3 mL Intravenous Q12H   tamsulosin  0.4 mg Oral Daily   vitamin B-12  100 mcg Oral Daily   warfarin  2.5 mg Oral ONCE-1600   Warfarin - Pharmacist Dosing Inpatient   Does not apply q1600    Infusions:  sodium chloride Stopped (04/07/21 0943)   sodium chloride     sodium chloride Stopped (04/09/21 2237)   amiodarone 30 mg/hr (04/11/21 0700)   epinephrine 3 mcg/min (04/11/21 0700)   lactated ringers Stopped (04/10/21 0526)   milrinone 0.125 mcg/kg/min (04/11/21 0700)   norepinephrine (LEVOPHED) Adult infusion 8 mcg/min (04/11/21 0700)     PRN Medications: sodium chloride, diphenhydrAMINE, melatonin, morphine injection, ondansetron (ZOFRAN) IV, oxyCODONE, polyvinyl alcohol, sodium chloride flush, sodium chloride flush, traMADol    Patient Profile   Mr Durbin is a 65 year old with history of smoker, CAD s/p previous MI, HTN, HL, chronic HFeEF, sepsis 11/2020 bacteremia.  Admitted with sepsis. Bld Cx - NGTD    Assessment/Plan   1. Acute on chronic systolic HFr EF -> cardiogenic shock - Echo 05/19/20 EF 20-25% RV mildly HK. moderate AS  Mean gradient 13 AVA 1.2 cm2 DI 0.30 - Persistent NYHA IV. Admitted with shock - s/p HM-III VAD + bioprosthetic AVR on 11/25 - RV looked good on TEE in OR - On milrinone 0.125, NE 8, epi 3. Co-ox 41%? If accurate. Repeat co-ox  - Continue midodrine 15 tid and wean NE today as tolerated - CVP 5-6, Still 10 lb above pre-op wt. Continue IV Lasix 80 mg bid   - Continue digoxin 0.125.    2. HM-3 LVAD implant - Plan as above - transfuse to keep hgb > 8.0 - VAD interrogated personally. Parameters stable. - Speed increased to 5500 on 11/25.  - ASA 325 until INR 1.8. INR 1.6 today - Continue warfarin. Increase slowly in setting of recent hemorrhagic pericarditis    3. Acute Hypoxic  Respiratory Failure  - Extubated 11/26, stable on 4L [place].    4. 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 CAD  s/p anterior STEMI (12/20). LHC showed chronically occluded RCA (with L>>R collaterals) and thrombotic occlusion of proximal LAD. Underwent PCI of LAD. - Continue atorvastatin 80.   6. Iron-deficiency anemia & ABLA - Rec'd Feraheme 11/8 and 11/13  - Keep hgb > 8.0 (8.2 today).   7. Hypokalemia/ Hypomagnesemia  - K 3.3, Mg 1.9  - supp lytes  - follow BMP   8. Severe protein-calorie malnutrition - prealbumin 11>15> 13 despite adequate caloric intake on calorie count - Continue aggressive nutritional support  9. Paroxysmal Atrial Fibrillation w/ RVR - new, developed 11/10. Now back in NSR   - Continue IV amio  10. Hyponatremia - Na 131 currently, FW restrict.   11. Sepsis - H/o strep bacteremia 11/2020.  ICD extracted 12/04/20. Barostim was not removed as it is extravascular.  - Received IV antibiotics until 01/15/21. Blood cultures negative 01/30/21..  - 10/26 -Blood CX x2  Negative  - Ct chest concerning for pneumonia. Treated w/ cefepime/vancomycin -> Augmentin (stop 11/7) - Now afebrile, WBCs 25 -> 18->13->11  12. Hemorrhagic Pericarditis - Fibrin clot encasing heart at time of surgery.  Will need to be careful with anticoagulation (go slowly).   5. 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  - Bcx recollected and pending  - Tissue sent to [facility] in  for broad 16 S ribosomal sequencing for bacterial pathogens as well as T wet Bhilai Bartonella Brucella  - ID following  - Currently AF. No F/C. WBC trending down, 11K today    Length of Stay: [address] PA-C  04/11/2021, 7:50 AM  Advanced Heart Failure Team Pager [phone] (M-F; 7a - 5p)  Please contact  [facility] for night-coverage after hours (5p -7a ) and weekends on [email or web address]   Patient seen and examined with the above-signed Advanced Practice Provider and/or Housestaff. I personally reviewed laboratory data, imaging studies and relevant notes. I independently examined the patient and formulated the important aspects of the plan. I have edited the note to reflect any of my changes or salient points. I have personally discussed the plan with the patient and/or family.  Feels ok today. Sitting up in chair. Was able to take a walk earlier. Remains on epi 3 NE 8 and milrinone 0.125. Also on midodrine. CVP 5-6. Co-ox 41%   General:  Sitting in chair NAD.  HEENT: normal  Neck: supple. JVP not elevated.  Carotids 2+ bilat; no bruits. No lymphadenopathy or thryomegaly appreciated. Cor: LVAD hum.  Sternal dressing ok. CT in place Lungs: Clear. Abdomen: obese soft, nontender, non-distended. No hepatosplenomegaly. No bruits or masses. Good bowel sounds. Driveline site clean. Anchor in place.  Extremities: no cyanosis, clubbing, rash. Warm no edema  Neuro: alert & oriented x 3. No focal deficits. Moves all 4 without problem   He remains on epi/ne/milrinone. MAPs soft. Co-ox 41% but suspect inaccurate.   Will wean epi to 2 and then continue to wean NE.  Given vasoplegia may need to stop milrinone soon. Continue IV diuresis. Supp K.   AoV situation discussed with ID and recommend repeat treatment for possible endocarditis with ceftriaxone and doxy given path findings.   I increased VAD speed to 5600. INR 1.6. Discussed dosing with PharmD personally.  CRITICAL CARE Performed by: Glori Bickers  Total critical care time: 35 minutes  Critical care time was exclusive of separately billable procedures and treating other patients.  Critical care was necessary to treat or prevent imminent or life-threatening deterioration.  Critical care was time spent personally by me (independent of  midlevel providers or residents) on the following activities: development of treatment plan with patient and/or surrogate as well as nursing, discussions with consultants, evaluation of patient's response to treatment, examination of patient, obtaining history from patient or surrogate, ordering and performing treatments and interventions, ordering and review of laboratory studies, ordering and review of radiographic studies, pulse oximetry and re-evaluation of patient's condition.  Glori Bickers, MD  8:46 AM

## 2021-04-11 NOTE — Progress Notes (Signed)
CSW visited bedside although patient sleeping comfortably. No family at bedside. CSW continues to follow throughout implant hospitalization. [name], [place], [place]

## 2021-04-11 NOTE — Progress Notes (Signed)
Removed ventricle pacing wires at 1250 per MD order. Patient tolerated the removal of the wires well and remained on bed rest for one hour. Betadine applied to removal site and new gauze dressing placed. Patient has no new complaint of pain or adverse signs and symptoms.

## 2021-04-11 NOTE — Progress Notes (Signed)
Occupational Therapy Treatment Patient Details Name: Brandon [name] MRN: [id number] DOB: 1955-09-07 Today's Date: 04/11/2021   History of present illness Pt is a 65 y.o. male admitted 03/07/21 with acute on chronic hypoxemic respiratory failure, cardiogenic shock. CT scan which shows bronchopneumonia and no indication of ILD. Pt undergoing workup for VAD. LVAD placed 04/06/21.  Other PMH includes CHF, ankylosing spondylitis, bacteremia and  possible endocarditis s/p ICD removal, HTN, depression, arthritis.   OT comments  Pt able to perform bed mobility with min guard assist, and was able to ambulate full circle on unit with min guard assist and min cues for safety.  He was able to switch power supply <> battery with mod A.  He requires up to mod ces for sequencing and problem solving with novel info/tasks.  Will continue to follow.    Recommendations for follow up therapy are one component of a multi-disciplinary discharge planning process, led by the attending physician.  Recommendations may be updated based on patient status, additional functional criteria and insurance authorization.    Follow Up Recommendations  No OT follow up    Assistance Recommended at Discharge Intermittent Supervision/Assistance  Equipment Recommendations  BSC/3in1;Tub/shower seat    Recommendations for Other Services      Precautions / Restrictions Precautions Precautions: Fall;Sternal;Other (comment) Precaution Comments: LVAD; chest tube       Mobility Bed Mobility Overal bed mobility: Needs Assistance Bed Mobility: Supine to Sit     Supine to sit: Min guard     General bed mobility comments: verbal cues to maintain precautions    Transfers Overall transfer level: Needs assistance Equipment used:  (EVA walker) Transfers: Sit to/from Stand;Bed to chair/wheelchair/BSC Sit to Stand: Min guard   Step pivot transfers: Min guard       General transfer comment: verbal cues for sequencing and to  maintain precautions     Balance Overall balance assessment: Needs assistance Sitting-balance support: Feet supported;No upper extremity supported Sitting balance-Leahy Scale: Fair     Standing balance support: Bilateral upper extremity supported;Reliant on assistive device for balance Standing balance-Leahy Scale: Poor                             ADL either performed or assessed with clinical judgement   ADL Overall ADL's : Needs assistance/impaired Eating/Feeding: Independent   Grooming: Wash/dry hands;Wash/dry face;Oral care;Set up;Sitting                   Toilet Transfer: Minimal assistance;+2 for safety/equipment;Ambulation;BSC/3in1           Functional mobility during ADLs: Min guard;Minimal assistance (EVA walker)      Extremity/Trunk Assessment Upper Extremity Assessment Upper Extremity Assessment: RUE deficits/detail RUE Deficits / Details: hx of DVT in R UE a few months ago, reports decreased pinch and grip strength. Impaired sensation but functional RUE Sensation: decreased light touch RUE Coordination: decreased fine motor   Lower Extremity Assessment Lower Extremity Assessment: Defer to PT evaluation        Vision   Vision Assessment?: No apparent visual deficits   Perception     Praxis      Cognition Arousal/Alertness: Awake/alert Behavior During Therapy: WFL for tasks assessed/performed Overall Cognitive Status: Impaired/Different from baseline Area of Impairment: Attention;Memory;Problem solving                   Current Attention Level: Selective;Alternating Memory: Decreased recall of precautions  Problem Solving: Slow processing;Difficulty sequencing;Requires verbal cues;Requires tactile cues General Comments: Pt very pleasant.  pt required min cues for precautions during functional tasks.  he required up to mod cues for problem solving and sequencing how to manage novel info/tasks.           Exercises Other Exercises Other Exercises: Pt able to switch from power source <> batteries with mod A   Shoulder Instructions       General Comments HR to 117 and Fi02 91% with pt on 3L supplemental 02    Pertinent Vitals/ Pain       Pain Assessment: Faces Faces Pain Scale: No hurt  Home Living                                          Prior Functioning/Environment              Frequency  Min 2X/week        Progress Toward Goals  OT Goals(current goals can now be found in the care plan section)  Progress towards OT goals: Progressing toward goals     Plan Discharge plan remains appropriate    Co-evaluation                 AM-PAC OT "6 Clicks" Daily Activity     Outcome Measure   Help from another person eating meals?: None Help from another person taking care of personal grooming?: A Little Help from another person toileting, which includes using toliet, bedpan, or urinal?: A Lot Help from another person bathing (including washing, rinsing, drying)?: A Lot Help from another person to put on and taking off regular upper body clothing?: A Lot Help from another person to put on and taking off regular lower body clothing?: A Lot 6 Click Score: 15    End of Session Equipment Utilized During Treatment: Oxygen;Other (comment) (EVA walker)  OT Visit Diagnosis: Unsteadiness on feet (R26.81);Muscle weakness (generalized) (M62.81);Pain   Activity Tolerance Patient tolerated treatment well   Patient Left in chair;with call bell/phone within reach   Nurse Communication Mobility status        Time: 6286-3817 OT Time Calculation (min): 53 min  Charges: OT General Charges $OT Visit: 1 Visit OT Treatments $Therapeutic Activity: 53-67 mins  Nilsa Nutting OTR/L Acute Rehabilitation Services Pager [phone] Office [phone]'   Lucille Passy M 04/11/2021, 5:32 PM

## 2021-04-11 NOTE — Progress Notes (Deleted)
ANTICOAGULATION CONSULT NOTE   Pharmacy Consult for warfarin Indication:  s/p VAD (HM3) , afib, hx DVT  No Active Allergies  Patient Measurements: Height: 6' (182.9 cm) Weight: 83.4 kg (183 lb 13.8 oz) IBW/kg (Calculated) : 77.6   Vital Signs: Temp: 98 F (36.7 C) (11/30 0445) Temp Source: Axillary (11/29 2300) BP: 101/79 (11/30 0400) Pulse Rate: 95 (11/30 0530)  Labs: Recent Labs    04/09/21 0359 04/09/21 0409 04/10/21 0233 04/10/21 0425 04/10/21 0703 04/10/21 1507 04/11/21 0352 04/11/21 0403  HGB 7.6*   < > 7.9* 8.5*  --   --  7.9* 8.2*  HCT 23.5*   < > 24.4* 25.0*  --   --  25.3* 24.0*  PLT 154  --  187  --   --   --  187  --   LABPROT 17.2*  --  18.3*  --   --   --  18.6*  --   INR 1.4*  --  1.5*  --   --   --  1.6*  --   CREATININE 0.60*  --   --   --  0.58* 0.59* 0.56*  --    < > = values in this interval not displayed.     Estimated Creatinine Clearance: 98.3 mL/min (A) (by C-G formula based on SCr of 0.56 mg/dL (L)).   Medical History: Past Medical History:  Diagnosis Date   AICD (automatic cardioverter/defibrillator) present 08/31/2019   Ankylosing spondylitis ([place])    Arthritis    BENIGN PROSTATIC HYPERTROPHY 06/07/2008   CHF (congestive heart failure) ([place])    COLONIC POLYPS, HX OF 06/07/2008   Depression    H/O hiatal hernia    Heart failure ([place])    HYPERLIPIDEMIA 06/07/2008   HYPERTENSION 06/07/2008   Myocardial infarction [facility]) 2005   NSTEMI, s/p LAD stent   NEPHROLITHIASIS, HX OF 06/07/2008   STEMI (ST elevation myocardial infarction) ([place]) 04/27/2019    Assessment: 65 yo M s/p HM 3 and AVR on 04/06/21. PTA patient was on apixiban for a DVT. Patient had complicated post-op course with vasoplegia requiring methylene blue and severe hemorrhagic pericarditis at the time of surgery. Per Dr. Cyndia Bent, if INR jumps up quickly he is high risk to bleed around his heart. Of note the patient is also on amiodarone and nutritional status not great at  baseline.  Given above, dosing Coumadin conservatively.  No signs of bleeding per RN.  Hgb low but up today. INR slightly up today at 1.6. No heparin bridge per [facility] but will be more aggressive in warfarin dosing to get INR therapeutic.  Goal of Therapy:  Warfarin INR 2.0-2.5 Monitor platelets by anticoagulation protocol: Yes   Plan:  Warfarin 5 mg PO x1 Continue aspirin 325 mg daily until INR > 1.8 then drop to 81 mg x 30 days s/p HM3 Daily INR and CBC  Laurey Arrow, PharmD PGY1 Pharmacy Resident 04/11/2021  12:18 PM  Please check [email or web address] for unit-specific pharmacy phone numbers.

## 2021-04-11 NOTE — Progress Notes (Signed)
ANTICOAGULATION CONSULT NOTE   Pharmacy Consult for warfarin Indication:  s/p VAD (HM3) , afib, hx DVT  No Active Allergies  Patient Measurements: Height: 6' (182.9 cm) Weight: 83.6 kg (184 lb 4.9 oz) IBW/kg (Calculated) : 77.6   Vital Signs: Temp: 97.8 F (36.6 C) (11/30 1234) Temp Source: Axillary (11/30 1234) BP: 101/79 (11/30 0400) Pulse Rate: 105 (11/30 1300)  Labs: Recent Labs    04/09/21 0359 04/09/21 0409 04/10/21 0233 04/10/21 0425 04/10/21 0703 04/10/21 1507 04/11/21 0352 04/11/21 0403  HGB 7.6*   < > 7.9* 8.5*  --   --  7.9* 8.2*  HCT 23.5*   < > 24.4* 25.0*  --   --  25.3* 24.0*  PLT 154  --  187  --   --   --  187  --   LABPROT 17.2*  --  18.3*  --   --   --  18.6*  --   INR 1.4*  --  1.5*  --   --   --  1.6*  --   CREATININE 0.60*  --   --   --  0.58* 0.59* 0.56*  --    < > = values in this interval not displayed.     Estimated Creatinine Clearance: 98.3 mL/min (A) (by C-G formula based on SCr of 0.56 mg/dL (L)).   Medical History: Past Medical History:  Diagnosis Date   AICD (automatic cardioverter/defibrillator) present 08/31/2019   Ankylosing spondylitis ([place])    Arthritis    BENIGN PROSTATIC HYPERTROPHY 06/07/2008   CHF (congestive heart failure) ([place])    COLONIC POLYPS, HX OF 06/07/2008   Depression    H/O hiatal hernia    Heart failure ([place])    HYPERLIPIDEMIA 06/07/2008   HYPERTENSION 06/07/2008   Myocardial infarction [facility]) 2005   NSTEMI, s/p LAD stent   NEPHROLITHIASIS, HX OF 06/07/2008   STEMI (ST elevation myocardial infarction) ([place]) 04/27/2019    Assessment: 65 yo M s/p HM 3 and AVR on 04/06/21. PTA patient was on apixiban for a DVT. Patient had complicated post-op course with vasoplegia requiring methylene blue and severe hemorrhagic pericarditis at the time of surgery. Per Dr. Cyndia Bent, if INR jumps up quickly he is high risk to bleed around his heart. Of note the patient is also on amiodarone and nutritional status not great at  baseline.  Given above, dosing Coumadin conservatively.  No signs of bleeding per RN.  Hgb low but up today. INR slightly up today at 1.6. No heparin bridge per [facility] but will be more aggressive in warfarin dosing to get INR therapeutic.  Goal of Therapy:  Warfarin INR 2.0-2.5 Monitor platelets by anticoagulation protocol: Yes   Plan:  Warfarin 3 mg PO x1 Continue aspirin 325 mg daily until INR > 1.8 then drop to 81 mg x 30 days s/p HM3 Daily INR and CBC  Laurey Arrow, PharmD PGY1 Pharmacy Resident 04/11/2021  1:30 PM  Please check [email or web address] for unit-specific pharmacy phone numbers.

## 2021-04-12 ENCOUNTER — Other Ambulatory Visit: Payer: Self-pay | Admitting: Pharmacy Technician

## 2021-04-12 DIAGNOSIS — J9621 Acute and chronic respiratory failure with hypoxia: Secondary | ICD-10-CM | POA: Diagnosis not present

## 2021-04-12 DIAGNOSIS — I509 Heart failure, unspecified: Secondary | ICD-10-CM

## 2021-04-12 DIAGNOSIS — Z95811 Presence of heart assist device: Secondary | ICD-10-CM

## 2021-04-12 LAB — BASIC METABOLIC PANEL
Anion gap: 6 (ref 5–15)
BUN: 17 mg/dL (ref 8–23)
CO2: 32 mmol/L (ref 22–32)
Calcium: 8.5 mg/dL — ABNORMAL LOW (ref 8.9–10.3)
Chloride: 95 mmol/L — ABNORMAL LOW (ref 98–111)
Creatinine, Ser: 0.53 mg/dL — ABNORMAL LOW (ref 0.61–1.24)
GFR, Estimated: >60 mL/min (ref >60)
Glucose, Bld: 89 mg/dL (ref 70–99)
Potassium: 3.9 mmol/L (ref 3.5–5.1)
Sodium: 133 mmol/L — ABNORMAL LOW (ref 135–145)

## 2021-04-12 LAB — PROTIME-INR
INR: 1.8 — ABNORMAL HIGH (ref 0.8–1.2)
Prothrombin Time: 20.6 seconds — ABNORMAL HIGH (ref 11.4–15.2)

## 2021-04-12 LAB — GLUCOSE, CAPILLARY
Glucose-Capillary: 88 mg/dL (ref 70–99)
Glucose-Capillary: 109 mg/dL — ABNORMAL HIGH (ref 70–99)
Glucose-Capillary: 65 mg/dL — ABNORMAL LOW (ref 70–99)
Glucose-Capillary: 145 mg/dL — ABNORMAL HIGH (ref 70–99)
Glucose-Capillary: 94 mg/dL (ref 70–99)

## 2021-04-12 LAB — CBC WITH DIFFERENTIAL/PLATELET
Abs Immature Granulocytes: 0.07 10*3/uL (ref 0.00–0.07)
Basophils Absolute: 0 10*3/uL (ref 0.0–0.1)
Basophils Relative: 0 %
Eosinophils Absolute: 0.3 10*3/uL (ref 0.0–0.5)
Eosinophils Relative: 3 %
HCT: 26.4 % — ABNORMAL LOW (ref 39.0–52.0)
Hemoglobin: 8 g/dL — ABNORMAL LOW (ref 13.0–17.0)
Immature Granulocytes: 1 %
Lymphocytes Relative: 6 %
Lymphs Abs: 0.7 10*3/uL (ref 0.7–4.0)
MCH: 24.8 pg — ABNORMAL LOW (ref 26.0–34.0)
MCHC: 30.3 g/dL (ref 30.0–36.0)
MCV: 81.7 fL (ref 80.0–100.0)
Monocytes Absolute: 0.9 10*3/uL (ref 0.1–1.0)
Monocytes Relative: 8 %
Neutro Abs: 9.4 10*3/uL — ABNORMAL HIGH (ref 1.7–7.7)
Neutrophils Relative %: 82 %
Platelets: 232 10*3/uL (ref 150–400)
RBC: 3.23 MIL/uL — ABNORMAL LOW (ref 4.22–5.81)
RDW: 20.3 % — ABNORMAL HIGH (ref 11.5–15.5)
WBC: 11.4 10*3/uL — ABNORMAL HIGH (ref 4.0–10.5)
nRBC: 0 % (ref 0.0–0.2)

## 2021-04-12 LAB — POCT I-STAT 7, (LYTES, BLD GAS, ICA,H+H)
Acid-Base Excess: 10 mmol/L — ABNORMAL HIGH (ref 0.0–2.0)
Bicarbonate: 33.6 mmol/L — ABNORMAL HIGH (ref 20.0–28.0)
Calcium, Ion: 1.17 mmol/L (ref 1.15–1.40)
HCT: 26 % — ABNORMAL LOW (ref 39.0–52.0)
Hemoglobin: 8.8 g/dL — ABNORMAL LOW (ref 13.0–17.0)
O2 Saturation: 94 %
Patient temperature: 98.4
Potassium: 3.9 mmol/L (ref 3.5–5.1)
Sodium: 134 mmol/L — ABNORMAL LOW (ref 135–145)
TCO2: 35 mmol/L — ABNORMAL HIGH (ref 22–32)
pCO2 arterial: 42.9 mmHg (ref 32.0–48.0)
pH, Arterial: 7.502 — ABNORMAL HIGH (ref 7.350–7.450)
pO2, Arterial: 64 mmHg — ABNORMAL LOW (ref 83.0–108.0)

## 2021-04-12 LAB — COOXEMETRY PANEL
Carboxyhemoglobin: 2 % — ABNORMAL HIGH (ref 0.5–1.5)
Methemoglobin: 0.7 % (ref 0.0–1.5)
O2 Saturation: 57.3 %
Total hemoglobin: 8.7 g/dL — ABNORMAL LOW (ref 12.0–16.0)

## 2021-04-12 LAB — MAGNESIUM: Magnesium: 1.9 mg/dL (ref 1.7–2.4)

## 2021-04-12 LAB — PHOSPHORUS: Phosphorus: 3.7 mg/dL (ref 2.5–4.6)

## 2021-04-12 LAB — LACTATE DEHYDROGENASE: LDH: 167 U/L (ref 98–192)

## 2021-04-12 MED ORDER — WARFARIN SODIUM 3 MG PO TABS
3.0000 mg | ORAL_TABLET | Freq: Once | ORAL | Status: AC
  Administered 2021-04-12: 3 mg via ORAL
  Filled 2021-04-12: qty 1

## 2021-04-12 MED ORDER — FUROSEMIDE 10 MG/ML IJ SOLN
80.0000 mg | Freq: Two times a day (BID) | INTRAMUSCULAR | Status: DC
  Administered 2021-04-12 (×2): 80 mg via INTRAVENOUS
  Filled 2021-04-12 (×2): qty 8

## 2021-04-12 MED ORDER — POTASSIUM CHLORIDE CRYS ER 20 MEQ PO TBCR
40.0000 meq | EXTENDED_RELEASE_TABLET | Freq: Three times a day (TID) | ORAL | Status: DC
  Administered 2021-04-12 – 2021-04-14 (×7): 40 meq via ORAL
  Filled 2021-04-12 (×7): qty 2

## 2021-04-12 MED ORDER — METOLAZONE 2.5 MG PO TABS
2.5000 mg | ORAL_TABLET | Freq: Once | ORAL | Status: AC
  Administered 2021-04-12: 2.5 mg via ORAL
  Filled 2021-04-12: qty 1

## 2021-04-12 MED ORDER — ASPIRIN EC 81 MG PO TBEC
81.0000 mg | DELAYED_RELEASE_TABLET | Freq: Every day | ORAL | Status: DC
  Administered 2021-04-13 – 2021-04-15 (×3): 81 mg via ORAL
  Filled 2021-04-12 (×3): qty 1

## 2021-04-12 MED ORDER — INSULIN ASPART 100 UNIT/ML IJ SOLN
0.0000 [IU] | Freq: Three times a day (TID) | INTRAMUSCULAR | Status: DC
  Administered 2021-04-12: 2 [IU] via SUBCUTANEOUS
  Administered 2021-04-13 (×3): 3 [IU] via SUBCUTANEOUS
  Administered 2021-04-14: 2 [IU] via SUBCUTANEOUS
  Administered 2021-04-14: 3 [IU] via SUBCUTANEOUS
  Administered 2021-04-15 – 2021-04-22 (×9): 2 [IU] via SUBCUTANEOUS
  Administered 2021-04-26: 3 [IU] via SUBCUTANEOUS
  Administered 2021-05-03: 17:00:00 1 [IU] via SUBCUTANEOUS

## 2021-04-12 MED ORDER — MAGNESIUM SULFATE 2 GM/50ML IV SOLN
2.0000 g | Freq: Once | INTRAVENOUS | Status: AC
  Administered 2021-04-12: 2 g via INTRAVENOUS
  Filled 2021-04-12: qty 50

## 2021-04-12 MED ORDER — AMIODARONE HCL 200 MG PO TABS
200.0000 mg | ORAL_TABLET | Freq: Two times a day (BID) | ORAL | Status: DC
  Administered 2021-04-12 – 2021-04-21 (×20): 200 mg via ORAL
  Filled 2021-04-12 (×20): qty 1

## 2021-04-12 MED FILL — Heparin Sodium (Porcine) Inj 1000 Unit/ML: Qty: 1000 | Status: AC

## 2021-04-12 MED FILL — Lidocaine HCl Local Preservative Free (PF) Inj 2%: INTRAMUSCULAR | Qty: 15 | Status: AC

## 2021-04-12 MED FILL — Potassium Chloride Inj 2 mEq/ML: INTRAVENOUS | Qty: 40 | Status: AC

## 2021-04-12 NOTE — Progress Notes (Signed)
Patient ID: Brandon [name], male   DOB: 1955-10-28, 65 y.o.   MRN: [id number] HeartMate 3 Rounding Note  Subjective:    Slept well, ambulated this am. Gets a little winded with walking but recovers quickly. Feels stronger every day. Last BM Monday.  Epi 2, milrinone 0.125, NE 8 with Co-ox 57%.  Arterial sat 94%, Hgb 8.0 CVP 7  -1666.6 cc yesterday but wt up 1 lb. He drinks a lot   LVAD INTERROGATION:  HeartMate IIl LVAD:  Flow 4.8 liters/min, speed 5600, power 4, PI 4.1.   Objective:    Vital Signs:   Temp:  [97.8 F (36.6 C)-98.5 F (36.9 C)] 98.4 F (36.9 C) (12/01 0315) Pulse Rate:  [57-111] 107 (12/01 0630) Resp:  [10-32] 27 (12/01 0630) BP: (78-99)/(63-77) 78/63 (12/01 0400) SpO2:  [89 %-100 %] 96 % (12/01 0630) Arterial Line BP: (60-109)/(46-85) 95/74 (12/01 0630) Weight:  [84.1 kg] 84.1 kg (12/01 0500) Last BM Date: 04/09/21 Mean arterial Pressure 70's  Intake/Output:   Intake/Output Summary (Last 24 hours) at 04/12/2021 0650 Last data filed at 04/12/2021 0600 Gross per 24 hour  Intake 3014.6 ml  Output 4996 ml  Net -1981.4 ml     Physical Exam: General:  Well appearing. No resp difficulty HEENT: normal Cor: normal heart sounds with LVAD hum present. Lungs: decreased BS in lower lobes. Abdomen: soft, nontender, nondistended. Good bowel sounds. Extremities: minimal edema Neuro: alert & orientedx3, cranial nerves grossly intact. moves all 4 extremities w/o difficulty. Affect pleasant  Telemetry: sinus on IV amio  Labs: Basic Metabolic Panel: Recent Labs  Lab 04/09/21 0359 04/09/21 0409 04/10/21 0233 04/10/21 0425 04/10/21 0703 04/10/21 1507 04/11/21 0352 04/11/21 0403 04/12/21 0333 04/12/21 0334  NA 132*   < >  --    < > 132* 132* 131* 135 133* 134*  K 3.6   < >  --    < > 3.2* 3.8 3.3* 3.4* 3.9 3.9  CL 97*  --   --   --  92* 92* 92*  --  95*  --   CO2 28  --   --   --  29 32 32  --  32  --   GLUCOSE 102*  --   --   --  136* 187* 152*  --  89  --    BUN 18  --   --   --  15 18 16   --  17  --   CREATININE 0.60*  --   --   --  0.58* 0.59* 0.56*  --  0.53*  --   CALCIUM 8.6*  --   --   --  8.3* 8.0* 8.1*  --  8.5*  --   MG 1.9  --  1.7  --   --  2.0 1.9  --  1.9  --   PHOS 3.4  --  2.8  --   --  2.7 3.0  --  3.7  --    < > = values in this interval not displayed.    Liver Function Tests: Recent Labs  Lab 04/07/21 0500 04/08/21 0359 04/09/21 0359  AST 86* 53* 34  ALT 50* 37 29  ALKPHOS 51 68 78  BILITOT 4.4* 2.6* 1.6*  PROT 5.2* 5.6* 5.6*  ALBUMIN 2.9* 2.7* 2.5*   No results for input(s): LIPASE, AMYLASE in the last 168 hours. No results for input(s): AMMONIA in the last 168 hours.  CBC: Recent Labs  Lab 04/08/21 0359 04/08/21  1610 04/09/21 0359 04/09/21 0409 04/10/21 0233 04/10/21 0425 04/11/21 0352 04/11/21 0403 04/12/21 0333 04/12/21 0334  WBC 24.9*  --  18.2*  --  13.1*  --  11.0*  --  11.4*  --   NEUTROABS 22.2*  --  16.3*  --  11.1*  --  8.8*  --  9.4*  --   HGB 8.4*   < > 7.6*   < > 7.9* 8.5* 7.9* 8.2* 8.0* 8.8*  HCT 24.6*   < > 23.5*   < > 24.4* 25.0* 25.3* 24.0* 26.4* 26.0*  MCV 79.6*  --  79.7*  --  80.0  --  82.7  --  81.7  --   PLT 141*  --  154  --  187  --  187  --  232  --    < > = values in this interval not displayed.    INR: Recent Labs  Lab 04/08/21 0359 04/09/21 0359 04/10/21 0233 04/11/21 0352 04/12/21 0333  INR 1.3* 1.4* 1.5* 1.6* 1.8*    Other results: EKG:   Imaging: No results found.   Medications:     Scheduled Medications:  (feeding supplement) PROSource Plus  30 mL Oral TID PC & HS   sodium chloride   Intravenous Once   aspirin EC  325 mg Oral Daily   Or   aspirin  324 mg Per Tube Daily   Or   aspirin  300 mg Rectal Daily   bisacodyl  10 mg Oral Daily   Or   bisacodyl  10 mg Rectal Daily   Chlorhexidine Gluconate Cloth  6 each Topical Daily   digoxin  0.125 mg Oral Daily   docusate sodium  200 mg Oral Daily   doxycycline  100 mg Oral Q12H   ezetimibe  10  mg Oral Daily   feeding supplement  237 mL Oral TID BM   folic acid  1 mg Oral Daily   gabapentin  200 mg Oral TID   insulin aspart  0-15 Units Subcutaneous TID WC   insulin detemir  15 Units Subcutaneous Daily   mouth rinse  15 mL Mouth Rinse BID   midodrine  15 mg Oral TID WC   multivitamin with minerals  1 tablet Oral Daily   pantoprazole  40 mg Oral Daily   polyethylene glycol  17 g Oral Daily   sertraline  100 mg Oral Daily   sodium chloride flush  10-40 mL Intracatheter Q12H   sodium chloride flush  10-40 mL Intracatheter Q12H   sodium chloride flush  3 mL Intravenous Q12H   tamsulosin  0.4 mg Oral Daily   vitamin B-12  100 mcg Oral Daily   Warfarin - Pharmacist Dosing Inpatient   Does not apply q1600    Infusions:  sodium chloride Stopped (04/07/21 0943)   sodium chloride     sodium chloride Stopped (04/09/21 2237)   amiodarone 30 mg/hr (04/12/21 0600)   cefTRIAXone (ROCEPHIN)  IV Stopped (04/11/21 1047)   epinephrine 2 mcg/min (04/12/21 0600)   lactated ringers Stopped (04/10/21 0526)   milrinone 0.125 mcg/kg/min (04/12/21 0600)   norepinephrine (LEVOPHED) Adult infusion 8 mcg/min (04/12/21 0600)    PRN Medications: sodium chloride, diphenhydrAMINE, melatonin, morphine injection, ondansetron (ZOFRAN) IV, oxyCODONE, polyvinyl alcohol, sodium chloride flush, sodium chloride flush, traMADol   Assessment:    POD 6 HM 3 LVAD and AVR with bioprosthetic valve for acute on chronic systolic heart failure with EF 20-25% and cardiogenic shock requiring milrinone. He  had a bicupid AV with moderate AS and AI. Intraop severe vasoplegia requiring multiple vasopressors and methylene blue. RV function looked good. PAF with RVR maintaing sinus on IV amio. 3.   Anklosing spondylitis and UE peripheral neuropathy related to that. 4.   Severe protein-calorie malnutrition with prealbumin 13 despite good caloric intact and protein supplements. 5.   Strep bacteremia 11/2020 with ICD  extraction 12/04/20 and IV antibiotics until 01/15/21. BC negative 01/30/21 and 03/07/21 when he presented again with signs of sepsis. CT chest was concerning for pneumonia and he was treated with Maxepime and Lucianne Lei, switched to Augmentin and stopped 03/19/2021. Pathology of valve felt to show signs of acute endocarditis. On Rocephin now per ID. All blood cultures have been negative. 6.   Hyponatremia 7.   Hypokalemia 8.   Iron deficiency anemia and acute postop blood loss anemia. 9.   Noted to have recent hemorrhagic pericarditis with heart encased in recent organized thrombus on Eliquis preop before stopping for surgery. 10.  Large bilateral pleural effusions drained at the time of surgery, related to heart failure.   Plan/Discussion:    He is hemodynamically stable on current inotropes/vasopressors and midodrine. Stuck on NE due to MAP low 70's at times. May be best to try off milrinone to see if that brings BP up. His PA pressures and RV were normal so benefit of milrinone marginal at best.   He is diuresing daily but wt not changing much. Still 15 lbs over preop if accurate.  DC pocket drain today.  Coumadin per pharmacy.  Continue IS, ambulation  Repeat CXR tomorrow.  I reviewed the LVAD parameters from today, and compared the results to the patient's prior recorded data.  No programming changes were made.  The LVAD is functioning within specified parameters.  The patient performs LVAD self-test daily.  LVAD interrogation was negative for any significant power changes, alarms or PI events/speed drops.  LVAD equipment check completed and is in good working order.  Back-up equipment present.   LVAD education done on emergency procedures and precautions and reviewed exit site care.  Length of Stay: [address]  Fernande Boyden [facility] 04/12/2021, 6:50 AM

## 2021-04-12 NOTE — Progress Notes (Signed)
Nursing Care Note:  Drive Line: Existing VAD dressing C/D/I. Anchor intact and accurately applied.  Daily dressings per VAD Coordinator, Nurse Davonna Belling, or trained caregiver. Next dressing change due tomorrow 04/13/21.   Drive line site unremarkable w/suture remaining intact.  No exudate or odor at site. Drive line dressing performed using sterile technique at 1815.  Aquacel Aq silver strip applied to drive line site and dressed appropriately.

## 2021-04-12 NOTE — Progress Notes (Signed)
LVAD Coordinator Rounding Note:  Admitted 03/07/21 due to sepsis. Dr. [name] consulted as his HF Cardiologist.   HM III LVAD implanted on 04/06/21 by Dr. Cyndia Bent under Destination Therapy criteria. Not a transplant candidate at this time due to recurrent infections.   Patient sitting up in the recliner upon my arrival.States he is feeling a little stronger each day. He walked this morning with some SOB, but "recovered quicker" and didn't have to stop and rest as he did yesterday. He also reports improvement in appetite.   Dr. Cyndia Bent at bedside and discussed patient plan with bedside nurse and patient. Foley cath dc'd earlier this am, will plan to dc last chest tube today. Encouraged patient to continue using incentive spirometer.    Vital signs: Temp: 97.9 HR:  105 Aline:  93/78 (84) O2 Sat:  99% on 3 L/[place] Wt:171.5>190>187.4>187>183.8>184>185 lbs     LVAD interrogation reveals:  Speed: 5600 Flow:  4.7 Power:  4.4w PI: 3.6 Alarms: none Events:  none Hematocrit: 25  Fixed speed: 5600 Low speed limit: 5300  Drive Line: Existing VAD dressing C/D/I. Anchor intact and accurately applied.  Daily dressings per VAD Coordinator, Nurse Davonna Belling, or trained caregiver. Next dressing change due today.    Labs:  LDH trend: 309>248>215>206>201>167  INR trend: 1.6>1.3>1.3>1.4>1.5>1.6>1.8  Anticoagulation Plan: -INR Goal: 2 - 2.5 -ASA Dose: 325 mg daily until INR therapeutic  Blood Products:   Intra-op: - 04/06/21>>4 PRBCs, 4 FFP, 1263 cc cell saver  Post-op: - 04/06/21>>1 unit Plts - 04/07/21>>1 unit PRBC  Device: N/A  Arrythmias: 03/22/21 - developed Afib w/ RVR, converted with IV amiodarone  Respiratory: extubated 04/07/21  Nitric Oxide: off 04/07/21  Gtts: - Epinephrine>>2 mcg/min - Milrinone>>0.125 mcg/kg/min - Levophed>>8 mcg/min - Amiodarone>>30 mg/hr  Infection: - 04/11/21 Blood cultures x 2>>pending  Patient/Family Teaching:  No family at bedside today.   Encouraged pt to continue reviewing HM III Patient Handbook  prior to formal discharge teaching. Pt verbalized understanding of same.  Answered questions re: wearables. Discussed options when patient's sternal precautions are over. Demonstrated proper way of placing battery in holster vest.   Plan/Recommendations:  1. Call VAD Coordinator for any VAD equipment or drive line issues. 2.  Daily dressing changes per VAD Coordinator, Nurse Davonna Belling, or trained caregiver.   Zada Girt RN [place] Coordinator  Office: [phone]  24/7 Pager: [phone]

## 2021-04-12 NOTE — Progress Notes (Signed)
Patient ID: BARBARA KENG, male   DOB: 20-May-1955, 65 y.o.   MRN: [id number]    Progress Note from the Palliative Medicine Team at [facility]   Patient Name: Brandon [name]        Date: 04/12/2021 DOB: [birth year]  Age: 65 y.o. MRN#: [id number] Attending Physician: Gaye Pollack, MD Primary Care Physician: [name], MD Admit Date: 03/07/2021   Medical records reviewed   65 y.o. male   admitted on 03/07/2021 with past medical history significant of CAD with previous MI, chronic systolic CHF/ICM w/ [employer] ICD that was extracted 7/22, moderate aortic stenosis, HTN, HLD, hx of recent right UE DVT who presents with sudden onset dyspnea.   04-06-21  HM-3 VAD placement and bioprosthetic AVR.    This NP visited patient at the bedside as a follow up for palliative medicine needs and emotional support.  Patient is progressing well; he "feels stronger every day", is ambulating in the halls, is positive and optimistic and looking to the future.  Emotional support offered  Education offered on the importance of continued conversation with his family and the  medical providers regarding overall plan of care and treatment options,  ensuring decisions are within the context of the patients values and GOCs.  Questions and concerns addressed   PMT will continue to support holistically  Total time spent on the unit was 25 minutes  Greater than 50% of the time was spent in counseling and coordination of care  Wadie Lessen NP  Palliative Medicine Team Team Phone # [phone] Pager [phone]

## 2021-04-12 NOTE — Progress Notes (Signed)
Physical Therapy Treatment Patient Details Name: Brandon [name] MRN: [id number] DOB: 12-Mar-1956 Today's Date: 04/12/2021   History of Present Illness Pt is a 65 y.o. male admitted 03/07/21 with acute on chronic hypoxemic respiratory failure, cardiogenic shock. CT scan which shows bronchopneumonia and no indication of ILD. Pt undergoing workup for VAD. LVAD placed 04/06/21.  Other PMH includes CHF, ankylosing spondylitis, bacteremia and  possible endocarditis s/p ICD removal, HTN, depression, arthritis.    PT Comments    Pt making steady progress with increasing mobility and activity tolerance. Able to make switch from power module to batteries back to power module with min assist.    Recommendations for follow up therapy are one component of a multi-disciplinary discharge planning process, led by the attending physician.  Recommendations may be updated based on patient status, additional functional criteria and insurance authorization.  Follow Up Recommendations  Home health PT     Assistance Recommended at Discharge PRN  Equipment Recommendations  None recommended by PT    Recommendations for Other Services       Precautions / Restrictions Precautions Precautions: Fall;Sternal;Other (comment) Precaution Comments: LVAD     Mobility  Bed Mobility Overal bed mobility: Needs Assistance Bed Mobility: Supine to Sit     Supine to sit: Min guard     General bed mobility comments: Assist for safety    Transfers Overall transfer level: Needs assistance Equipment used:  (eva walker) Transfers: Sit to/from Stand Sit to Stand: Min guard           General transfer comment: Verbal cues for hand placement    Ambulation/Gait Ambulation/Gait assistance: Min guard Gait Distance (Feet): 370 Feet Assistive device: Ethelene Hal Gait Pattern/deviations: Step-through pattern;Decreased stride length;Trunk flexed Gait velocity: Decreased Gait velocity interpretation: 1.31 - 2.62  ft/sec, indicative of limited community ambulator   General Gait Details: Assist for safety and lines. Pt working on Engineer, building services Rank[place] (Stroke Patients Only)       Balance Overall balance assessment: Needs assistance Sitting-balance support: Feet supported;No upper extremity supported Sitting balance-Leahy Scale: Good     Standing balance support: No upper extremity supported Standing balance-Leahy Scale: Fair                              Cognition Arousal/Alertness: Awake/alert Behavior During Therapy: WFL for tasks assessed/performed Overall Cognitive Status: Impaired/Different from baseline                               Problem Solving: Requires verbal cues          Exercises      General Comments General comments (skin integrity, edema, etc.): SpO2 92% with amb on 3L O2      Pertinent Vitals/Pain Pain Assessment: Faces Faces Pain Scale: Hurts a little bit Pain Location: lt shoulder Pain Descriptors / Indicators: Sore Pain Intervention(s): Monitored during session    Home Living                          Prior Function            PT Goals (current goals can now be found in the care plan section) Acute Rehab PT Goals Patient Stated Goal: return to walking without  a device Progress towards PT goals: Progressing toward goals    Frequency    Min 3X/week      PT Plan Current plan remains appropriate    Co-evaluation              AM-PAC PT "6 Clicks" Mobility   Outcome Measure  Help needed turning from your back to your side while in a flat bed without using bedrails?: A Little Help needed moving from lying on your back to sitting on the side of a flat bed without using bedrails?: A Little Help needed moving to and from a bed to a chair (including a wheelchair)?: A Little Help needed standing up from a chair using your arms (e.g.,  wheelchair or bedside chair)?: A Little Help needed to walk in hospital room?: A Little Help needed climbing 3-5 steps with a railing? : A Little 6 Click Score: 18    End of Session Equipment Utilized During Treatment: Oxygen Activity Tolerance: Patient tolerated treatment well Patient left: in chair;with call bell/phone within reach Nurse Communication: Mobility status PT Visit Diagnosis: Difficulty in walking, not elsewhere classified (R26.2);Muscle weakness (generalized) (M62.81)     Time: 6168-3729 PT Time Calculation (min) (ACUTE ONLY): 32 min  Charges:  $Gait Training: 23-37 mins                     [place] Pager [phone] Office [place] 04/12/2021, 4:38 PM

## 2021-04-12 NOTE — Plan of Care (Signed)
  Problem: Activity: Goal: Risk for activity intolerance will decrease Outcome: Progressing   Problem: Nutrition: Goal: Adequate nutrition will be maintained Outcome: Progressing   Problem: Coping: Goal: Level of anxiety will decrease Outcome: Progressing   Problem: Pain Managment: Goal: General experience of comfort will improve Outcome: Progressing   Problem: Safety: Goal: Ability to remain free from injury will improve Outcome: Progressing   Problem: Education: Goal: Knowledge of the prescribed therapeutic regimen will improve Outcome: Progressing   Problem: Activity: Goal: Risk for activity intolerance will decrease Outcome: Progressing   Problem: Cardiac: Goal: Ability to maintain an adequate cardiac output will improve Outcome: Progressing   Problem: Coping: Goal: Level of anxiety will decrease Outcome: Progressing   Problem: Fluid Volume: Goal: Risk for excess fluid volume will decrease Outcome: Progressing   Problem: Clinical Measurements: Goal: Ability to maintain clinical measurements within normal limits will improve Outcome: Progressing Goal: Will remain free from infection Outcome: Progressing

## 2021-04-12 NOTE — Progress Notes (Addendum)
Patient ID: Brandon [name], male   DOB: [birth year], 65 y.o.   MRN: [id number]     Advanced Heart Failure Rounding Note  PCP-Cardiologist: [name], MD   Subjective:    Admitted with sepsis. Started on cefepime + vanc, now off abx (ID following). Afebrile.  10/29 Co-ox 48%, started on milrinone 0.25.  10/30 IV Antibiotics stopped. Blood cultures no growth for 5 days.  10/31 started on Augmentin for PNA.  11/1 Milrinone off 11/2 Milrinone added back. Midodrine increased to 10 mg TID 11/10 Developed Afib w/ RVR, converted with IV amio 11/14 Milrinone increased to 0.25 mcg. Diuresed with IV lasix  11/25 Underwent HM-3 VAD placement and bioprosthetic AVR. Had persistent hypotension in OR requiring methylene blue and high dose pressors.  11/26 Extubated 11/28 Started on lasix gtt>>later stopped due to low CVP (1) and increasing pressor requirements. Restarted on scheduled BID IV Lasix once pressure stabilized (still volume up)  11/28 Surgical path with evidence of possible "acute AoV endocarditis". ID consulted. Bcx recollected. Tissue sent to [facility] in [place].   11/30 Started on IV ceftriaxone to cover the Streptococcus gordonae that was isolated before, per ID. 11/30 VAD speed increased to 5600  Epi down to 2. Remains on NE 8 and Milrinone 0.125. Co-ox 57%. 4.6L in UOP yesterday. Net negative 2L, though wt up 1 lb. ? Accurate. Will ask RN to [employer]. CVP 8.    CT output decreasing, 330>>236cc  Remains AF. WBC stable at 11K. BCx pending   Feeling stronger today. Just completed a walk. Easier than yesterday. Appetite is improving. Ate most of his breakfast today.   Remains on 3L [place].   LVAD Interrogation HM 3: Speed: 5650 Flow: 4.7 PI: 3.9 Power: 4.4. 1 PI event. VAD interrogated personally. Parameters stable.     Objective:   Weight Range: 84.1 kg Body mass index is 25.15 kg/m.   Vital Signs:   Temp:  [97.8 F (36.6 C)-98.5 F (36.9 C)] 98.4 F (36.9 C) (12/01 0315) Pulse  Rate:  [57-111] 101 (12/01 0540) Resp:  [10-32] 30 (12/01 0615) BP: (78-99)/(63-77) 78/63 (12/01 0400) SpO2:  [89 %-100 %] 93 % (12/01 0540) Arterial Line BP: (60-117)/(46-85) 94/77 (12/01 0615) Weight:  [84.1 kg] 84.1 kg (12/01 0500) Last BM Date: 04/09/21  Weight change: Filed Weights   04/10/21 0650 04/11/21 0500 04/12/21 0500  Weight: 83.4 kg 83.6 kg 84.1 kg    Intake/Output:   Intake/Output Summary (Last 24 hours) at 04/12/2021 0626 Last data filed at 04/12/2021 0600 Gross per 24 hour  Intake 3014.6 ml  Output 5006 ml  Net -1991.4 ml     Physical Exam   CVP 8 General:  well appearing. OOB sitting up in chair. No distress  HEENT: normal  Neck: supple. JVD 8 cm  Cor: sternal wound ok LVAD hum. + CT Lungs: decreased BS bilaterally at the bases   Abdomen: soft, nontender, non-distended. No hepatosplenomegaly. No bruits or masses. Good bowel sounds. Driveline site clean. Anchor in place.  Extremities: no cyanosis, clubbing, rash. Warm, trace bilateral LE,  TED hoses, + RUE PICC  Neuro: alert & oriented x 3. No focal deficits. Moves all 4 without problem   MAP: 70   Telemetry   Sinus tach 110s Personally reviewed   Labs    CBC Recent Labs    04/11/21 0352 04/11/21 0403 04/12/21 0333 04/12/21 0334  WBC 11.0*  --  11.4*  --   NEUTROABS 8.8*  --  9.4*  --  HGB 7.9*   < > 8.0* 8.8*  HCT 25.3*   < > 26.4* 26.0*  MCV 82.7  --  81.7  --   PLT 187  --  232  --    < > = values in this interval not displayed.   Basic Metabolic Panel Recent Labs    04/11/21 0352 04/11/21 0403 04/12/21 0333 04/12/21 0334  NA 131*   < > 133* 134*  K 3.3*   < > 3.9 3.9  CL 92*  --  95*  --   CO2 32  --  32  --   GLUCOSE 152*  --  89  --   BUN 16  --  17  --   CREATININE 0.56*  --  0.53*  --   CALCIUM 8.1*  --  8.5*  --   MG 1.9  --  1.9  --   PHOS 3.0  --  3.7  --    < > = values in this interval not displayed.   Liver Function Tests No results for input(s): AST, ALT,  ALKPHOS, BILITOT, PROT, ALBUMIN in the last 72 hours.   No results for input(s): LIPASE, AMYLASE in the last 72 hours. Cardiac Enzymes No results for input(s): CKTOTAL, CKMB, CKMBINDEX, TROPONINI in the last 72 hours.  BNP: BNP (last 3 results) Recent Labs    01/08/21 0731 01/18/21 1629 04/07/21 0500  BNP 734.2* 607.9* 663.2*    ProBNP (last 3 results) No results for input(s): PROBNP in the last 8760 hours.    D-Dimer No results for input(s): DDIMER in the last 72 hours. Hemoglobin A1C No results for input(s): HGBA1C in the last 72 hours.  Fasting Lipid Panel No results for input(s): CHOL, HDL, LDLCALC, TRIG, CHOLHDL, LDLDIRECT in the last 72 hours. Thyroid Function Tests No results for input(s): TSH, T4TOTAL, T3FREE, THYROIDAB in the last 72 hours.  Invalid input(s): FREET3  Other results:   Imaging    No results found.   Medications:     Scheduled Medications:  (feeding supplement) PROSource Plus  30 mL Oral TID PC & HS   sodium chloride   Intravenous Once   aspirin EC  325 mg Oral Daily   Or   aspirin  324 mg Per Tube Daily   Or   aspirin  300 mg Rectal Daily   bisacodyl  10 mg Oral Daily   Or   bisacodyl  10 mg Rectal Daily   Chlorhexidine Gluconate Cloth  6 each Topical Daily   digoxin  0.125 mg Oral Daily   docusate sodium  200 mg Oral Daily   doxycycline  100 mg Oral Q12H   ezetimibe  10 mg Oral Daily   feeding supplement  237 mL Oral TID BM   folic acid  1 mg Oral Daily   gabapentin  200 mg Oral TID   insulin aspart  0-15 Units Subcutaneous Q4H   insulin detemir  15 Units Subcutaneous Daily   mouth rinse  15 mL Mouth Rinse BID   midodrine  15 mg Oral TID WC   multivitamin with minerals  1 tablet Oral Daily   pantoprazole  40 mg Oral Daily   polyethylene glycol  17 g Oral Daily   sertraline  100 mg Oral Daily   sodium chloride flush  10-40 mL Intracatheter Q12H   sodium chloride flush  10-40 mL Intracatheter Q12H   sodium chloride  flush  3 mL Intravenous Q12H   tamsulosin  0.4 mg  Oral Daily   vitamin B-12  100 mcg Oral Daily   Warfarin - Pharmacist Dosing Inpatient   Does not apply q1600    Infusions:  sodium chloride Stopped (04/07/21 0943)   sodium chloride     sodium chloride Stopped (04/09/21 2237)   amiodarone 30 mg/hr (04/12/21 0600)   cefTRIAXone (ROCEPHIN)  IV Stopped (04/11/21 1047)   epinephrine 2 mcg/min (04/12/21 0600)   lactated ringers Stopped (04/10/21 0526)   milrinone 0.125 mcg/kg/min (04/12/21 0600)   norepinephrine (LEVOPHED) Adult infusion 8 mcg/min (04/12/21 0600)     PRN Medications: sodium chloride, diphenhydrAMINE, melatonin, morphine injection, ondansetron (ZOFRAN) IV, oxyCODONE, polyvinyl alcohol, sodium chloride flush, sodium chloride flush, traMADol    Patient Profile   Mr Evetts is a 65 year old with history of smoker, CAD s/p previous MI, HTN, HL, chronic HFeEF, sepsis 11/2020 bacteremia.  Admitted with sepsis. Bld Cx - NGTD    Assessment/Plan   1. Acute on chronic systolic HFr EF -> cardiogenic shock - Echo 05/19/20 EF 20-25% RV mildly HK. moderate AS  Mean gradient 13 AVA 1.2 cm2 DI 0.30 - Persistent NYHA IV. Admitted with shock - s/p HM-III VAD + bioprosthetic AVR on 11/25 - RV looked good on TEE in OR - On milrinone 0.125, NE 8, epi 2. Co-ox 57 % - Continue midodrine 15 tid and wean NE today as tolerated - CVP 8, Still 10 lb above pre-op wt. Continue IV Lasix 80 mg bid. Add metolazone 2.5 mg   - Continue digoxin 0.125.    2. HM-3 LVAD implant - Plan as above - transfuse to keep hgb > 8.0 - VAD interrogated personally. Parameters stable. - Speed increased to 5500 on 11/25.  - Speed increased to 5600 on 11/30  - on ASA 325. Reduce to 81 mg today w/ INR now at 1.8  - Continue warfarin. Increase slowly in setting of recent hemorrhagic pericarditis    3. Acute Hypoxic Respiratory Failure  - Extubated 11/26, stable on 4L [place].    4. 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 CAD  s/p anterior STEMI (12/20). LHC showed chronically occluded RCA (with L>>R collaterals) and thrombotic occlusion of proximal LAD. Underwent PCI of LAD. - Continue atorvastatin 80.   6. Iron-deficiency anemia & ABLA - Rec'd Feraheme 11/8 and 11/13  - Keep hgb > 8.0 (8.8 today).   7. Hypokalemia/ Hypomagnesemia  - K 3.9, Mg 1.9  - supp lytes PRN  - follow BMP   8. Severe protein-calorie malnutrition - prealbumin 11>15> 13 despite adequate caloric intake on calorie count - Continue aggressive nutritional support  9. Paroxysmal Atrial Fibrillation w/ RVR - new, developed 11/10. Now back in NSR   - Continue IV amio - on warfarin   10. Hyponatremia - Na 134 currently, FW restrict.   11. Sepsis - H/o strep bacteremia 11/2020.  ICD extracted 12/04/20. Barostim was not removed as it is extravascular.  - Received IV antibiotics until 01/15/21. Blood cultures negative 01/30/21..  - 10/26 -Blood CX x2  Negative  - Ct chest concerning for pneumonia. 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 Hemorrhagic Pericarditis - Fibrin clot encasing heart at time of surgery.  Will need to be  careful with anticoagulation (go slowly).   13. 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 - Ceftriaxone 2 g IV daily x 6 weeks. End Date: March 22, 2022 - Currently AF. No F/C. WBC trending down, 11K today    Length of Stay: [address] PA-C  04/12/2021, 6:26 AM  Advanced Heart Failure Team Pager [phone] (M-F; 7a - 5p)  Please contact [facility] for night-coverage after hours (5p -7a ) and weekends on [email or web address]  Agree with above.   Feels stronger every day. Walking the unit with assistance. Remains on NE, epi, milrinone and midodrine. Co-ox ox. MAPs marginal. Weight still up though volume status does not look too bad on exam. Renal function stable. Remains on IV abx for potential endocarditis of native AoV.  General:  Sitting in chair NAD.  HEENT: normal  Neck: supple. JVP not elevated.  Carotids 2+ bilat; no bruits. No lymphadenopathy or thryomegaly appreciated. Cor: LVAD hum.  Lungs: Clear. Decreased at bases Abdomen:  soft, nontender, non-distended. No hepatosplenomegaly. No bruits or masses. Good bowel sounds. Driveline site clean. Anchor in place.  Extremities: no cyanosis, clubbing, rash. Warm no edema  Neuro: alert & oriented x 3. No focal deficits. Moves all 4 without problem   Improving clinically but still requiring inotropes/pressors. Was very vasoplegic in OR. Will stop milrinone today and see if this helps wean. Agree with continuing IV lasix and giving one dose metolazone.(Discussed with PharmD as well).   Can switch IV amio to po. Continue IV abx. Continue ambulation.   INR 1.8. Would run on low end of INR goal with recent hemorrhagic pericarditis. Decrease ASA to 81.   Ramp echo tomorrow.   CRITICAL CARE Performed by: Glori Bickers  Total critical care time: 35 minutes  Critical care time was exclusive of separately billable procedures and treating other patients.  Critical care was necessary to treat or prevent imminent or life-threatening deterioration.  Critical care was time spent personally by me  (independent of midlevel providers or residents) on the following activities: development of treatment plan with patient and/or surrogate as well as nursing, discussions with consultants, evaluation of patient's response to treatment, examination of patient, obtaining history from patient or surrogate, ordering and performing treatments and interventions, ordering and review of laboratory studies, ordering and review of radiographic studies, pulse oximetry and re-evaluation of patient's condition.  Glori Bickers, MD  9:40 AM

## 2021-04-12 NOTE — Progress Notes (Signed)
CSW met with patient at bedside. Patient stated he is doing well with his recovery and feels good about his progress. He states he has been ambulating the halls each day and feels a bit stronger. Patient was in good spirits and asked about the LVAD Support Group and hopeful to be able to attend the December meeting. CSW provided supportive intervention and will continue to follow. [name], [place], [place], [place]

## 2021-04-12 NOTE — Progress Notes (Signed)
ANTICOAGULATION CONSULT NOTE   Pharmacy Consult for warfarin Indication:  s/p VAD (HM3) , afib, hx DVT  No Active Allergies  Patient Measurements: Height: 6' (182.9 cm) Weight: 84.1 kg (185 lb 6.5 oz) IBW/kg (Calculated) : 77.6   Vital Signs: Temp: 97.9 F (36.6 C) (12/01 0808) Temp Source: Oral (12/01 0808) BP: 78/63 (12/01 0400) Pulse Rate: 117 (12/01 0700)  Labs: Recent Labs    04/10/21 0233 04/10/21 0425 04/10/21 1507 04/11/21 0352 04/11/21 0403 04/12/21 0333 04/12/21 0334  HGB 7.9*   < >  --  7.9* 8.2* 8.0* 8.8*  HCT 24.4*   < >  --  25.3* 24.0* 26.4* 26.0*  PLT 187  --   --  187  --  232  --   LABPROT 18.3*  --   --  18.6*  --  20.6*  --   INR 1.5*  --   --  1.6*  --  1.8*  --   CREATININE  --    < > 0.59* 0.56*  --  0.53*  --    < > = values in this interval not displayed.     Estimated Creatinine Clearance: 98.3 mL/min (A) (by C-G formula based on SCr of 0.53 mg/dL (L)).   Medical History: Past Medical History:  Diagnosis Date   AICD (automatic cardioverter/defibrillator) present 08/31/2019   Ankylosing spondylitis ([place])    Arthritis    BENIGN PROSTATIC HYPERTROPHY 06/07/2008   CHF (congestive heart failure) ([place])    COLONIC POLYPS, HX OF 06/07/2008   Depression    H/O hiatal hernia    Heart failure ([place])    HYPERLIPIDEMIA 06/07/2008   HYPERTENSION 06/07/2008   Myocardial infarction [facility]) 2005   NSTEMI, s/p LAD stent   NEPHROLITHIASIS, HX OF 06/07/2008   STEMI (ST elevation myocardial infarction) ([place]) 04/27/2019    Assessment: 65 yo M s/p HM 3 and AVR on 04/06/21. PTA patient was on apixiban for a DVT. Patient had complicated post-op course with vasoplegia requiring methylene blue and severe hemorrhagic pericarditis at the time of surgery.   Post op dosing Coumadin conservatively - given elevated Tbili post op improved, 2.6>1.6, and improving nutritional status on ensure TID. No signs of bleeding.  Hgb low stable 8s, pltc stable, LDH stable 160s.  INR up trending 1.8.  Can drop ASA 81mg  x30days post implant.   Goal of Therapy:  Warfarin INR 2.0-2.5 Monitor platelets by anticoagulation protocol: Yes   Plan:  Warfarin 3 mg PO x1 Decrease aspirin 81mg  dialy Daily INR and CBC   Bonnita Nasuti Pharm.D. CPP, BCPS Clinical Pharmacist [phone] 04/12/2021 8:38 AM   Please check [email or web address] for unit-specific pharmacy phone numbers.

## 2021-04-13 ENCOUNTER — Inpatient Hospital Stay (HOSPITAL_COMMUNITY): Payer: Medicare Other

## 2021-04-13 DIAGNOSIS — J9621 Acute and chronic respiratory failure with hypoxia: Secondary | ICD-10-CM | POA: Diagnosis not present

## 2021-04-13 DIAGNOSIS — R570 Cardiogenic shock: Secondary | ICD-10-CM

## 2021-04-13 DIAGNOSIS — Z95811 Presence of heart assist device: Secondary | ICD-10-CM | POA: Diagnosis not present

## 2021-04-13 DIAGNOSIS — I5023 Acute on chronic systolic (congestive) heart failure: Secondary | ICD-10-CM | POA: Diagnosis not present

## 2021-04-13 LAB — CBC WITH DIFFERENTIAL/PLATELET
Abs Immature Granulocytes: 0.08 10*3/uL — ABNORMAL HIGH (ref 0.00–0.07)
Basophils Absolute: 0 10*3/uL (ref 0.0–0.1)
Basophils Relative: 0 %
Eosinophils Absolute: 0.2 10*3/uL (ref 0.0–0.5)
Eosinophils Relative: 2 %
HCT: 30 % — ABNORMAL LOW (ref 39.0–52.0)
Hemoglobin: 9.4 g/dL — ABNORMAL LOW (ref 13.0–17.0)
Immature Granulocytes: 1 %
Lymphocytes Relative: 5 %
Lymphs Abs: 0.8 10*3/uL (ref 0.7–4.0)
MCH: 25.5 pg — ABNORMAL LOW (ref 26.0–34.0)
MCHC: 31.3 g/dL (ref 30.0–36.0)
MCV: 81.3 fL (ref 80.0–100.0)
Monocytes Absolute: 1.3 10*3/uL — ABNORMAL HIGH (ref 0.1–1.0)
Monocytes Relative: 9 %
Neutro Abs: 11.8 10*3/uL — ABNORMAL HIGH (ref 1.7–7.7)
Neutrophils Relative %: 83 %
Platelets: 329 10*3/uL (ref 150–400)
RBC: 3.69 MIL/uL — ABNORMAL LOW (ref 4.22–5.81)
RDW: 20.1 % — ABNORMAL HIGH (ref 11.5–15.5)
WBC: 14.2 10*3/uL — ABNORMAL HIGH (ref 4.0–10.5)
nRBC: 0 % (ref 0.0–0.2)

## 2021-04-13 LAB — BASIC METABOLIC PANEL
Anion gap: 12 (ref 5–15)
BUN: 15 mg/dL (ref 8–23)
CO2: 34 mmol/L — ABNORMAL HIGH (ref 22–32)
Calcium: 8.6 mg/dL — ABNORMAL LOW (ref 8.9–10.3)
Chloride: 86 mmol/L — ABNORMAL LOW (ref 98–111)
Creatinine, Ser: 0.59 mg/dL — ABNORMAL LOW (ref 0.61–1.24)
GFR, Estimated: >60 mL/min (ref >60)
Glucose, Bld: 111 mg/dL — ABNORMAL HIGH (ref 70–99)
Potassium: 3.1 mmol/L — ABNORMAL LOW (ref 3.5–5.1)
Sodium: 132 mmol/L — ABNORMAL LOW (ref 135–145)

## 2021-04-13 LAB — BRAIN NATRIURETIC PEPTIDE: B Natriuretic Peptide: 438.2 pg/mL — ABNORMAL HIGH (ref 0.0–100.0)

## 2021-04-13 LAB — COOXEMETRY PANEL
Carboxyhemoglobin: 2.2 % — ABNORMAL HIGH (ref 0.5–1.5)
Carboxyhemoglobin: 2.4 % — ABNORMAL HIGH (ref 0.5–1.5)
Methemoglobin: 0.9 % (ref 0.0–1.5)
Methemoglobin: 0.8 % (ref 0.0–1.5)
O2 Saturation: 50.3 %
O2 Saturation: 60.1 %
Total hemoglobin: 9.1 g/dL — ABNORMAL LOW (ref 12.0–16.0)
Total hemoglobin: 9 g/dL — ABNORMAL LOW (ref 12.0–16.0)

## 2021-04-13 LAB — PROTIME-INR
INR: 2 — ABNORMAL HIGH (ref 0.8–1.2)
Prothrombin Time: 22.8 seconds — ABNORMAL HIGH (ref 11.4–15.2)

## 2021-04-13 LAB — GLUCOSE, CAPILLARY
Glucose-Capillary: 167 mg/dL — ABNORMAL HIGH (ref 70–99)
Glucose-Capillary: 159 mg/dL — ABNORMAL HIGH (ref 70–99)
Glucose-Capillary: 161 mg/dL — ABNORMAL HIGH (ref 70–99)
Glucose-Capillary: 96 mg/dL (ref 70–99)

## 2021-04-13 LAB — PHOSPHORUS: Phosphorus: 4.5 mg/dL (ref 2.5–4.6)

## 2021-04-13 LAB — LACTATE DEHYDROGENASE: LDH: 178 U/L (ref 98–192)

## 2021-04-13 LAB — MAGNESIUM: Magnesium: 1.9 mg/dL (ref 1.7–2.4)

## 2021-04-13 MED ORDER — WARFARIN SODIUM 3 MG PO TABS
3.0000 mg | ORAL_TABLET | Freq: Once | ORAL | Status: AC
  Administered 2021-04-13: 3 mg via ORAL
  Filled 2021-04-13: qty 1

## 2021-04-13 MED ORDER — SORBITOL 70 % SOLN
30.0000 mL | Freq: Once | Status: AC
  Administered 2021-04-13: 30 mL via ORAL
  Filled 2021-04-13: qty 30

## 2021-04-13 MED ORDER — SODIUM CHLORIDE 0.9 % IV SOLN: INTRAVENOUS | Status: DC | PRN

## 2021-04-13 MED ORDER — POTASSIUM CHLORIDE 20 MEQ PO PACK: 20.0000 meq | PACK | ORAL | Status: DC

## 2021-04-13 MED ORDER — POTASSIUM CHLORIDE CRYS ER 20 MEQ PO TBCR
20.0000 meq | EXTENDED_RELEASE_TABLET | ORAL | Status: AC
  Administered 2021-04-13 (×3): 20 meq via ORAL
  Filled 2021-04-13 (×2): qty 1

## 2021-04-13 MED ORDER — SODIUM CHLORIDE 0.9 % IV BOLUS
250.0000 mL | Freq: Once | INTRAVENOUS | Status: AC
  Administered 2021-04-13: 250 mL via INTRAVENOUS

## 2021-04-13 MED FILL — Sodium Bicarbonate IV Soln 8.4%: INTRAVENOUS | Qty: 300 | Status: AC

## 2021-04-13 MED FILL — Heparin Sodium (Porcine) Inj 1000 Unit/ML: INTRAMUSCULAR | Qty: 30 | Status: AC

## 2021-04-13 MED FILL — Electrolyte-R (PH 7.4) Solution: INTRAVENOUS | Qty: 3000 | Status: AC

## 2021-04-13 MED FILL — Calcium Chloride Inj 10%: INTRAVENOUS | Qty: 10 | Status: AC

## 2021-04-13 MED FILL — Heparin Sodium (Porcine) Inj 1000 Unit/ML: INTRAMUSCULAR | Qty: 3 | Status: AC

## 2021-04-13 MED FILL — Mannitol IV Soln 20%: INTRAVENOUS | Qty: 500 | Status: AC

## 2021-04-13 NOTE — Progress Notes (Signed)
Driveline dressing change:  Existing VAD dressing removed and site care performed using sterile technique. Drive line exit site cleaned with Chlora prep applicators x 2, rinsed with saline, allowed to dry, and gauze dressing with silver strip applied. Exit site unincorporated with suture intact, the velour is fully implanted at exit site. Slight redness around sutures noted. Scant amount fat necrosis drainage, no tenderness, foul odor or rash noted. Drive line anchor re-applied. Advance to Monday, Wednesday and Friday dressing changes per VAD Coordinator, Nurse Davonna Belling, or trained caregiver. Next dressing change due 04/16/21.       Tanda Rockers RN, BSN VAD Coordinator 24/7 Pager [phone]

## 2021-04-13 NOTE — Progress Notes (Signed)
LVAD Coordinator Rounding Note:  Admitted 03/07/21 due to sepsis. Dr. [name] consulted as his HF Cardiologist.   HM III LVAD implanted on 04/06/21 by Dr. Cyndia Bent under Destination Therapy criteria. Not a transplant candidate at this time due to recurrent infections.   Patient sitting up in the recliner eating breakfast. He walked this morning and reports he feels "weaker" and got "dizzy" when he was walking.   Bedside nurse reports patient's HR is higher than yesterday, CVP 3, weight is down, PI running a bit lower; Hgb stable. BS nurse will discuss with Dr. [name] prior to giving am Lasix dose.    Vital signs: Temp: 98.7 HR:  115 [place]:  84/72 (78) O2 Sat:  98% on 3 L/[place] Wt:171.5>190>187.4>187>183.8>184>185>178 lbs     LVAD interrogation reveals:  Speed: 5600 Flow:  5.0 Power:  4.3w PI: 2.8 Alarms: none Events:  none Hematocrit: 25  Fixed speed: 5600 Low speed limit: 5300  Drive Line:  Dressing C/D/I with anchor intact and accurately applied.  Daily dressings per VAD Coordinator, Nurse Davonna Belling, or trained caregiver. Next dressing change due today.    Labs:  LDH trend: 309>248>215>206>201>167>178  INR trend: 1.6>1.3>1.3>1.4>1.5>1.6>1.8>2.0  Anticoagulation Plan: -INR Goal: 2 - 2.5 -ASA Dose: 81 mg   Blood Products:   Intra-op: - 04/06/21>>4 PRBCs, 4 FFP, 1263 cc cell saver  Post-op: - 04/06/21>>1 unit Plts - 04/07/21>>1 unit PRBC  Device: N/A  Arrythmias: 03/22/21 - developed Afib w/ RVR, converted with IV amiodarone  Respiratory: extubated 04/07/21  Nitric Oxide: off 04/07/21  Gtts: - Epinephrine>>2 mcg/min - Levophed>>8 mcg/min - Amiodarone>>converted to PO 04/12/21  Infection: - 04/11/21 Blood cultures x 2>>NTD  Patient/Family Teaching:  No family at bedside today.  Encouraged pt to continue reviewing HM III Patient Handbook  prior to formal discharge teaching. Pt verbalized understanding of same.  Reviewed daily controller self check and  switching power source. Pt feels comfortable doing both.   Plan/Recommendations:  1. Call VAD Coordinator for any VAD equipment or drive line issues. 2.  Daily dressing changes per VAD Coordinator, Nurse Davonna Belling, or trained caregiver.   Zada Girt RN [place] Coordinator  Office: [phone]  24/7 Pager: [phone]

## 2021-04-13 NOTE — Plan of Care (Signed)
  Problem: Activity: Goal: Risk for activity intolerance will decrease Outcome: Progressing   Problem: Nutrition: Goal: Adequate nutrition will be maintained Outcome: Progressing   Problem: Coping: Goal: Level of anxiety will decrease Outcome: Progressing   Problem: Elimination: Goal: Will not experience complications related to urinary retention Outcome: Not Progressing   Problem: Pain Managment: Goal: General experience of comfort will improve 04/13/2021 0751 by Monna Fam, RN Outcome: Progressing 04/12/2021 2158 by Monna Fam, RN Outcome: Progressing   Problem: Education: Goal: Ability to demonstrate management of disease process will improve Outcome: Progressing Goal: Ability to verbalize understanding of medication therapies will improve Outcome: Progressing   Problem: Activity: Goal: Capacity to carry out activities will improve Outcome: Progressing   Problem: Cardiac: Goal: Ability to achieve and maintain adequate cardiopulmonary perfusion will improve Outcome: Progressing   Problem: Education: Goal: Knowledge of the prescribed therapeutic regimen will improve 04/13/2021 0751 by Monna Fam, RN Outcome: Progressing 04/12/2021 2158 by Monna Fam, RN Outcome: Progressing   Problem: Activity: Goal: Risk for activity intolerance will decrease Outcome: Progressing   Problem: Cardiac: Goal: Ability to maintain an adequate cardiac output will improve 04/13/2021 0751 by Monna Fam, RN Outcome: Progressing 04/12/2021 2158 by Monna Fam, RN Outcome: Progressing   Problem: Coping: Goal: Level of anxiety will decrease 04/13/2021 0751 by Monna Fam, RN Outcome: Progressing 04/12/2021 2158 by Monna Fam, RN Outcome: Progressing   Problem: Fluid Volume: Goal: Risk for excess fluid volume will decrease 04/13/2021 0751 by Monna Fam, RN Outcome: Progressing 04/12/2021 2158 by Monna Fam, RN Outcome: Progressing   Problem: Clinical Measurements: Goal: Ability  to maintain clinical measurements within normal limits will improve 04/13/2021 0751 by Monna Fam, RN Outcome: Progressing 04/12/2021 2158 by Monna Fam, RN Outcome: Progressing Goal: Will remain free from infection 04/13/2021 0751 by Monna Fam, RN Outcome: Progressing 04/12/2021 2158 by Monna Fam, RN Outcome: Progressing   Problem: Respiratory: Goal: Will regain and/or maintain adequate ventilation Outcome: Progressing

## 2021-04-13 NOTE — Progress Notes (Signed)
ANTICOAGULATION CONSULT NOTE   Pharmacy Consult for warfarin Indication:  s/p VAD (HM3) , afib, hx DVT  No Active Allergies  Patient Measurements: Height: 6' (182.9 cm) Weight: 80.8 kg (178 lb 2.1 oz) IBW/kg (Calculated) : 77.6   Vital Signs: Temp: 97.2 F (36.2 C) (12/02 1137) Temp Source: Axillary (12/02 1137) BP: 77/65 (12/02 0800) Pulse Rate: 99 (12/02 1100)  Labs: Recent Labs    04/11/21 0352 04/11/21 0403 04/12/21 0333 04/12/21 0334 04/13/21 0333  HGB 7.9*   < > 8.0* 8.8* 9.4*  HCT 25.3*   < > 26.4* 26.0* 30.0*  PLT 187  --  232  --  329  LABPROT 18.6*  --  20.6*  --  22.8*  INR 1.6*  --  1.8*  --  2.0*  CREATININE 0.56*  --  0.53*  --  0.59*   < > = values in this interval not displayed.     Estimated Creatinine Clearance: 98.3 mL/min (A) (by C-G formula based on SCr of 0.59 mg/dL (L)).   Medical History: Past Medical History:  Diagnosis Date   AICD (automatic cardioverter/defibrillator) present 08/31/2019   Ankylosing spondylitis ([place])    Arthritis    BENIGN PROSTATIC HYPERTROPHY 06/07/2008   CHF (congestive heart failure) ([place])    COLONIC POLYPS, HX OF 06/07/2008   Depression    H/O hiatal hernia    Heart failure ([place])    HYPERLIPIDEMIA 06/07/2008   HYPERTENSION 06/07/2008   Myocardial infarction [facility]) 2005   NSTEMI, s/p LAD stent   NEPHROLITHIASIS, HX OF 06/07/2008   STEMI (ST elevation myocardial infarction) ([place]) 04/27/2019    Assessment: 65 yo M s/p HM 3 and AVR on 04/06/21. PTA patient was on apixiban for a DVT. Patient had complicated post-op course with vasoplegia requiring methylene blue and severe hemorrhagic pericarditis at the time of surgery.   Post op dosing Coumadin conservatively - given elevated Tbili post op improved, 2.6>1.6, and improving nutritional status on ensure TID. No signs of bleeding.  Hgb 9.4, pltc stable, LDH stable 178. INR up trending = 2 today   Can drop ASA 81mg  x 30 days post implant.   Goal of Therapy:   Warfarin INR 2.0-2.5 Monitor platelets by anticoagulation protocol: Yes   Plan:  Warfarin 3 mg PO x1 Daily INR and CBC  [place] Crane, [place]ena [place], BCPS, BCCP Clinical Pharmacist  04/13/2021 1:46 PM   [facility] pharmacy phone numbers are listed on [email or web address]

## 2021-04-13 NOTE — Progress Notes (Addendum)
Patient ID: Brandon [name], male   DOB: Jun 24, 1955, 65 y.o.   MRN: [id number]     Advanced Heart Failure Rounding Note  PCP-Cardiologist: [name], MD   Subjective:    Admitted with sepsis. Started on cefepime + vanc, now off abx (ID following). Afebrile.  10/29 Co-ox 48%, started on milrinone 0.25.  10/30 IV Antibiotics stopped. Blood cultures no growth for 5 days.  10/31 started on Augmentin for PNA.  11/1 Milrinone off 11/2 Milrinone added back. Midodrine increased to 10 mg TID 11/10 Developed Afib w/ RVR, converted with IV amio 11/14 Milrinone increased to 0.25 mcg. Diuresed with IV lasix  11/25 Underwent HM-3 VAD placement and bioprosthetic AVR. Had persistent hypotension in OR requiring methylene blue and high dose pressors.  11/26 Extubated 11/28 Started on lasix gtt>>later stopped due to low CVP (1) and increasing pressor requirements. Restarted on scheduled BID IV Lasix once pressure stabilized (still volume up)  11/28 Surgical path with evidence of possible "acute AoV endocarditis". ID consulted. Bcx recollected. Tissue sent to [facility] in [place].   11/30 Started on IV ceftriaxone to cover the Streptococcus gordonae that was isolated before, per ID. 11/30 VAD speed increased to 5600 12/1 Diuresed with IV lasix + metolazone. Milrinone was stopped. Amio drip transitioned to amio 200 mg twice a day.   Remains on Epi at 2 and NE 8. CO-OX 50%.   Brisk diuresis over the last 24 hours. Weight down another 7 pounds. Required I/O x2  for urinary retention.   Remains AF. WBC 11K>14K . 11/30 - BCx NGTD    Complaining of dizziness and fatigue.  Overnight had 50 mg benadryl + melatonin.  LVAD Interrogation HM 3: Speed: 5600 Flow: 4.8 PI: 4.1 Power: 4 Rare PI events.  VAD interrogated personally. Parameters stable.     Objective:   Weight Range: 80.8 kg Body mass index is 24.16 kg/m.   Vital Signs:   Temp:  [96.7 F (35.9 C)-98.7 F (37.1 C)] 98.7 F (37.1 C) (12/02  0730) Pulse Rate:  [28-132] 106 (12/02 0730) Resp:  [12-34] 16 (12/02 0800) BP: (71-94)/(61-77) 80/65 (12/02 0400) SpO2:  [92 %-100 %] 100 % (12/02 0800) Arterial Line BP: (68-123)/(56-108) 77/65 (12/02 0800) Weight:  [80.8 kg] 80.8 kg (12/02 0500) Last BM Date: 05/09/21  Weight change: Filed Weights   04/11/21 0500 04/12/21 0500 04/13/21 0500  Weight: 83.6 kg 84.1 kg 80.8 kg    Intake/Output:   Intake/Output Summary (Last 24 hours) at 04/13/2021 0933 Last data filed at 04/13/2021 0800 Gross per 24 hour  Intake 2112.25 ml  Output 5150 ml  Net -3037.75 ml     Physical Exam  Map 70-80s  CVP 4-5 personally checked.  Physical Exam: GENERAL: No acute distress. HEENT: normal  NECK: Supple, JVP  flat.  2+ bilaterally, no bruits.  No lymphadenopathy or thyromegaly appreciated.   CARDIAC:  Mechanical heart sounds with LVAD hum present.  LUNGS:  Clear to auscultation bilaterally.  ABDOMEN:  Soft, round, nontender, positive bowel sounds x4.     LVAD exit site:   Dressing dry and intact.  No erythema or drainage.  Stabilization device present and accurately applied.  Driveline dressing is being changed daily per sterile technique. EXTREMITIES:  Warm and dry, no cyanosis, clubbing, rash or edema . RUE PICC  NEUROLOGIC:  Alert and oriented x 3.    No aphasia.  No dysarthria.  Affect flat     Telemetry   ST 100s personally checked  Labs    CBC Recent Labs    04/12/21 0333 04/12/21 0334 04/13/21 0333  WBC 11.4*  --  14.2*  NEUTROABS 9.4*  --  11.8*  HGB 8.0* 8.8* 9.4*  HCT 26.4* 26.0* 30.0*  MCV 81.7  --  81.3  PLT 232  --  741   Basic Metabolic Panel Recent Labs    04/12/21 0333 04/12/21 0334 04/13/21 0333  NA 133* 134* 132*  K 3.9 3.9 3.1*  CL 95*  --  86*  CO2 32  --  34*  GLUCOSE 89  --  111*  BUN 17  --  15  CREATININE 0.53*  --  0.59*  CALCIUM 8.5*  --  8.6*  MG 1.9  --  1.9  PHOS 3.7  --  4.5   Liver Function Tests No results for input(s): AST, ALT,  ALKPHOS, BILITOT, PROT, ALBUMIN in the last 72 hours.   No results for input(s): LIPASE, AMYLASE in the last 72 hours. Cardiac Enzymes No results for input(s): CKTOTAL, CKMB, CKMBINDEX, TROPONINI in the last 72 hours.  BNP: BNP (last 3 results) Recent Labs    01/18/21 1629 04/07/21 0500 04/13/21 0333  BNP 607.9* 663.2* 438.2*    ProBNP (last 3 results) No results for input(s): PROBNP in the last 8760 hours.    D-Dimer No results for input(s): DDIMER in the last 72 hours. Hemoglobin A1C No results for input(s): HGBA1C in the last 72 hours.  Fasting Lipid Panel No results for input(s): CHOL, HDL, LDLCALC, TRIG, CHOLHDL, LDLDIRECT in the last 72 hours. Thyroid Function Tests No results for input(s): TSH, T4TOTAL, T3FREE, THYROIDAB in the last 72 hours.  Invalid input(s): FREET3  Other results:   Imaging    DG CHEST PORT 1 VIEW  Result Date: 04/13/2021 CLINICAL DATA:  Shortness of breath, recent LVAD placement, chest soreness EXAM: PORTABLE CHEST 1 VIEW COMPARISON:  Portable exam 0520 hours compared to 04/09/2021 FINDINGS: Enlargement of cardiac silhouette post LVAD. RIGHT arm PICC line tip projects over cavoatrial junction. Pulmonary vascular congestion. BILATERAL pulmonary infiltrates likely representing pulmonary edema. Associated LEFT pleural effusion and basilar atelectasis. No pneumothorax. IMPRESSION: Enlargement of cardiac silhouette post LVAD. Persistent probable pulmonary edema with mildly increased LEFT pleural effusion and basilar atelectasis. Electronically Signed   By: Lavonia Dana M.D.   On: 04/13/2021 08:53     Medications:     Scheduled Medications:  (feeding supplement) PROSource Plus  30 mL Oral TID PC & HS   sodium chloride   Intravenous Once   amiodarone  200 mg Oral BID   aspirin EC  81 mg Oral Daily   bisacodyl  10 mg Oral Daily   Or   bisacodyl  10 mg Rectal Daily   Chlorhexidine Gluconate Cloth  6 each Topical Daily   digoxin  0.125 mg Oral  Daily   docusate sodium  200 mg Oral Daily   doxycycline  100 mg Oral Q12H   ezetimibe  10 mg Oral Daily   feeding supplement  237 mL Oral TID BM   folic acid  1 mg Oral Daily   furosemide  80 mg Intravenous BID   gabapentin  200 mg Oral TID   insulin aspart  0-15 Units Subcutaneous TID WC   insulin detemir  15 Units Subcutaneous Daily   mouth rinse  15 mL Mouth Rinse BID   midodrine  15 mg Oral TID WC   multivitamin with minerals  1 tablet Oral Daily   pantoprazole  40 mg Oral Daily   polyethylene glycol  17 g Oral Daily   potassium chloride  20 mEq Oral Q4H   potassium chloride  40 mEq Oral TID   sertraline  100 mg Oral Daily   sodium chloride flush  10-40 mL Intracatheter Q12H   sodium chloride flush  10-40 mL Intracatheter Q12H   sodium chloride flush  3 mL Intravenous Q12H   tamsulosin  0.4 mg Oral Daily   vitamin B-12  100 mcg Oral Daily   Warfarin - Pharmacist Dosing Inpatient   Does not apply q1600    Infusions:  sodium chloride Stopped (04/07/21 0943)   sodium chloride     sodium chloride Stopped (04/09/21 2237)   cefTRIAXone (ROCEPHIN)  IV Stopped (04/12/21 0953)   epinephrine 2 mcg/min (04/13/21 0800)   lactated ringers 10 mL/hr at 04/13/21 0800   norepinephrine (LEVOPHED) Adult infusion 8 mcg/min (04/13/21 0800)     PRN Medications: sodium chloride, diphenhydrAMINE, melatonin, morphine injection, ondansetron (ZOFRAN) IV, oxyCODONE, polyvinyl alcohol, sodium chloride flush, sodium chloride flush, traMADol    Patient Profile   Brandon [name] is a 65 year old with history of smoker, CAD s/p previous MI, HTN, HL, chronic HFeEF, sepsis 11/2020 bacteremia.  Admitted with sepsis. Bld Cx - NGTD    Assessment/Plan   1. Acute on chronic systolic HFr EF -> cardiogenic shock - Echo 05/19/20 EF 20-25% RV mildly HK. moderate AS  Mean gradient 13 AVA 1.2 cm2 DI 0.30 - Persistent NYHA IV. Admitted with shock - s/p HM-III VAD + bioprosthetic AVR on 11/25 - RV looked good on  TEE in OR - On NE 8, epi 2. Co-ox 50% . Repeat CO-OX  - Continue midodrine 15 tid and wean NE today as tolerated - CVP 4-5. Dizzy. Will stop lasix. Given 250 cc NS bolus.  - Supp K - Continue digoxin 0.125.   2. HM-3 LVAD implant - Plan as above - transfuse to keep hgb > 8.0 - VAD interrogated personally. Parameters stable. - Speed increased to 5500 on 11/25.  - Speed increased to 5600 on 11/30  - Continue 81 mg today w/ INR 2 today  - Continue warfarin. Increase slowly in setting of recent hemorrhagic pericarditis   - Ramp Echo today.   3. Acute Hypoxic Respiratory Failure  - Extubated 11/26, stable on 3L [place].    4. 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 CAD  s/p anterior STEMI (12/20). LHC showed chronically occluded RCA (with L>>R collaterals) and thrombotic occlusion of proximal LAD. Underwent PCI of LAD. - Continue atorvastatin 80.   6. Iron-deficiency anemia & ABLA - Rec'd Feraheme 11/8 and 11/13  - Keep hgb > 8.0 (9.4  today).   7. Hypokalemia/ Hypomagnesemia  - K 3.1. Supp K  - Mag 1.9  Give 2 grams Mag  - follow BMP   8. Severe protein-calorie malnutrition - prealbumin 11>15> 13 despite adequate caloric intake on calorie count - Continue aggressive nutritional support  9. Paroxysmal Atrial Fibrillation w/ RVR - new, developed 11/10. Now back in NSR   - Off amio drip. Continue amio 200 mg twice a day.  - on warfarin   10. Hyponatremia - Na 132  currently, FW restrict.   11. Sepsis - H/o strep bacteremia 11/2020.  ICD extracted 12/04/20. Barostim was not removed as it is extravascular.  - Received IV antibiotics until 01/15/21. Blood  cultures negative 01/30/21..  - 10/26 -Blood CX x2  Negative  - Ct chest concerning for pneumonia. Treated w/ cefepime/vancomycin -> Augmentin (stop 11/7) - Now afebrile, WBCs 25 -> 18->13->11->14  - IV ceftriaxone restarted 11/30  to cover the Streptococcus gordonae that was isolated before given possible acute AoV endocarditis on surgical path  12. Hemorrhagic Pericarditis - Fibrin clot encasing heart at time of surgery.  Will need to be careful with anticoagulation (go slowly).   20. 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  - Bcx recollected and pending  - Tissue sent to [facility] in [place] for broad 16 S ribosomal sequencing for bacterial pathogens as well as T wet Bhilai Bartonella Brucella  - ID following, recommending  IV ceftriaxone to cover the Streptococcus gordonae that was isolated before given possible acute AoV endocarditis on surgical path. - Ceftriaxone 2 g IV daily x 6 weeks. End Date: March 22, 2022 -WBC 14.2   14. Urinary Retention  -Over the last 24 hours had I and O cath x2.  - Suspect benadryl. Will stop and use melatonin.   PT will need HHPT.   Length of Stay: [place], NP  04/13/2021, 9:33 AM  Advanced Heart Failure Team Pager [phone] (M-F; 7a - 5p)  Please contact [facility] for night-coverage after hours (5p -7a ) and weekends on [email or web address]  Agree with above.   Remains on Epi and NE. Co-ox marginal. Volume status low.   Got benadryl 50 kast night. Feels sleepy this am. Having some urinary retention requiring in/out cath.   General: Sitting up in bed  NAD.  HEENT: normal  Neck: supple. JVP not elevated.  Carotids 2+ bilat; no bruits. No lymphadenopathy or thryomegaly appreciated. Cor: LVAD hum. Surgical wound ok Lungs: Clear. Abdomen: obese soft, nontender, non-distended. No hepatosplenomegaly. No bruits or masses. Good bowel sounds. Driveline site clean. Anchor in place.  Extremities: no cyanosis, clubbing, rash. Warm no edema  Neuro: alert & oriented x 3. No focal deficits. Moves all 4 without problem   He remains on NE and epi due to vasoplegia. Milrinone stopped.  Co-ox marginal. Will give some IVF back. Midodrine increased. Continue to wean pressors slowly.   Avoid benadryl due to anti-cholinergic effects. Follow bladder scans if PVR > 600cc will need to replace Foley. Continue Flomax.   Give sorbitol for constipation. Ambulate.   VAD interrogated personally. Parameters stable. INR 2.0 today. Personally reviewed  Ramp echo Monday.   CRITICAL CARE Performed by: Glori Bickers  Total critical care time: 35 minutes  Critical care time was exclusive of separately billable procedures and treating other patients.  Critical care was necessary to treat or prevent imminent or life-threatening deterioration.  Critical care was time spent personally by me (independent of midlevel providers or residents) on the following activities: development of treatment plan with patient and/or surrogate as well as nursing, discussions with consultants, evaluation of patient's response to treatment, examination of patient, obtaining history from patient or surrogate, ordering and performing treatments and interventions, ordering and review of laboratory studies, ordering and review of radiographic studies, pulse oximetry and re-evaluation of patient's condition.  Glori Bickers, MD  11:17 AM

## 2021-04-14 DIAGNOSIS — Z95811 Presence of heart assist device: Secondary | ICD-10-CM | POA: Diagnosis not present

## 2021-04-14 DIAGNOSIS — J9621 Acute and chronic respiratory failure with hypoxia: Secondary | ICD-10-CM | POA: Diagnosis not present

## 2021-04-14 DIAGNOSIS — I5023 Acute on chronic systolic (congestive) heart failure: Secondary | ICD-10-CM | POA: Diagnosis not present

## 2021-04-14 DIAGNOSIS — R570 Cardiogenic shock: Secondary | ICD-10-CM | POA: Diagnosis not present

## 2021-04-14 LAB — PROTIME-INR
INR: 2.1 — ABNORMAL HIGH (ref 0.8–1.2)
Prothrombin Time: 23.2 seconds — ABNORMAL HIGH (ref 11.4–15.2)

## 2021-04-14 LAB — BLOOD GAS, ARTERIAL
Acid-Base Excess: 7.7 mmol/L — ABNORMAL HIGH (ref 0.0–2.0)
Bicarbonate: 32 mmol/L — ABNORMAL HIGH (ref 20.0–28.0)
FIO2: 36
O2 Saturation: 95.6 %
Patient temperature: 36.9
pCO2 arterial: 47.7 mmHg (ref 32.0–48.0)
pH, Arterial: 7.441 (ref 7.350–7.450)
pO2, Arterial: 77 mmHg — ABNORMAL LOW (ref 83.0–108.0)

## 2021-04-14 LAB — BASIC METABOLIC PANEL
Anion gap: 7 (ref 5–15)
BUN: 15 mg/dL (ref 8–23)
CO2: 31 mmol/L (ref 22–32)
Calcium: 8.6 mg/dL — ABNORMAL LOW (ref 8.9–10.3)
Chloride: 92 mmol/L — ABNORMAL LOW (ref 98–111)
Creatinine, Ser: 0.5 mg/dL — ABNORMAL LOW (ref 0.61–1.24)
GFR, Estimated: >60 mL/min (ref >60)
Glucose, Bld: 121 mg/dL — ABNORMAL HIGH (ref 70–99)
Potassium: 4.4 mmol/L (ref 3.5–5.1)
Sodium: 130 mmol/L — ABNORMAL LOW (ref 135–145)

## 2021-04-14 LAB — CBC WITH DIFFERENTIAL/PLATELET
Abs Immature Granulocytes: 0.08 10*3/uL — ABNORMAL HIGH (ref 0.00–0.07)
Basophils Absolute: 0 10*3/uL (ref 0.0–0.1)
Basophils Relative: 0 %
Eosinophils Absolute: 0.2 10*3/uL (ref 0.0–0.5)
Eosinophils Relative: 1 %
HCT: 26.8 % — ABNORMAL LOW (ref 39.0–52.0)
Hemoglobin: 8.2 g/dL — ABNORMAL LOW (ref 13.0–17.0)
Immature Granulocytes: 1 %
Lymphocytes Relative: 4 %
Lymphs Abs: 0.6 10*3/uL — ABNORMAL LOW (ref 0.7–4.0)
MCH: 25 pg — ABNORMAL LOW (ref 26.0–34.0)
MCHC: 30.6 g/dL (ref 30.0–36.0)
MCV: 81.7 fL (ref 80.0–100.0)
Monocytes Absolute: 1.2 10*3/uL — ABNORMAL HIGH (ref 0.1–1.0)
Monocytes Relative: 9 %
Neutro Abs: 11.9 10*3/uL — ABNORMAL HIGH (ref 1.7–7.7)
Neutrophils Relative %: 85 %
Platelets: 333 10*3/uL (ref 150–400)
RBC: 3.28 MIL/uL — ABNORMAL LOW (ref 4.22–5.81)
RDW: 19.9 % — ABNORMAL HIGH (ref 11.5–15.5)
WBC: 14 10*3/uL — ABNORMAL HIGH (ref 4.0–10.5)
nRBC: 0 % (ref 0.0–0.2)

## 2021-04-14 LAB — COOXEMETRY PANEL
Carboxyhemoglobin: 2.2 % — ABNORMAL HIGH (ref 0.5–1.5)
Methemoglobin: 0.6 % (ref 0.0–1.5)
O2 Saturation: 62.2 %
Total hemoglobin: 8.2 g/dL — ABNORMAL LOW (ref 12.0–16.0)

## 2021-04-14 LAB — GLUCOSE, CAPILLARY
Glucose-Capillary: 108 mg/dL — ABNORMAL HIGH (ref 70–99)
Glucose-Capillary: 145 mg/dL — ABNORMAL HIGH (ref 70–99)
Glucose-Capillary: 153 mg/dL — ABNORMAL HIGH (ref 70–99)
Glucose-Capillary: 95 mg/dL (ref 70–99)

## 2021-04-14 LAB — MAGNESIUM: Magnesium: 1.8 mg/dL (ref 1.7–2.4)

## 2021-04-14 LAB — LACTATE DEHYDROGENASE: LDH: 179 U/L (ref 98–192)

## 2021-04-14 MED ORDER — MAGNESIUM SULFATE 2 GM/50ML IV SOLN
2.0000 g | Freq: Once | INTRAVENOUS | Status: AC
  Administered 2021-04-14: 2 g via INTRAVENOUS
  Filled 2021-04-14: qty 50

## 2021-04-14 MED ORDER — WARFARIN SODIUM 2.5 MG PO TABS
2.5000 mg | ORAL_TABLET | Freq: Once | ORAL | Status: AC
  Administered 2021-04-14: 2.5 mg via ORAL
  Filled 2021-04-14: qty 1

## 2021-04-14 NOTE — Progress Notes (Signed)
Patient ID: Brandon [name], male   DOB: 30-Apr-1956, 65 y.o.   MRN: [id number]     Advanced Heart Failure Rounding Note  PCP-Cardiologist: [name], MD   Subjective:    Admitted with sepsis. Started on cefepime + vanc, now off abx (ID following). Afebrile.  10/29 Co-ox 48%, started on milrinone 0.25.  10/30 IV Antibiotics stopped. Blood cultures no growth for 5 days.  10/31 started on Augmentin for PNA.  11/1 Milrinone off 11/2 Milrinone added back. Midodrine increased to 10 mg TID 11/10 Developed Afib w/ RVR, converted with IV amio 11/14 Milrinone increased to 0.25 mcg. Diuresed with IV lasix  11/25 Underwent HM-3 VAD placement and bioprosthetic AVR. Had persistent hypotension in OR requiring methylene blue and high dose pressors.  11/26 Extubated 11/28 Started on lasix gtt>>later stopped due to low CVP (1) and increasing pressor requirements. Restarted on scheduled BID IV Lasix once pressure stabilized (still volume up)  11/28 Surgical path with evidence of possible "acute AoV endocarditis". ID consulted. Bcx recollected. Tissue sent to [facility] in [place].   11/30 Started on IV ceftriaxone to cover the Streptococcus gordonae that was isolated before, per ID. 11/30 VAD speed increased to 5600 12/1 Diuresed with IV lasix + metolazone. Milrinone was stopped. Amio drip transitioned to amio 200 mg twice a day.   Remains on Epi at 2 and NE 5 (down from 8). On midodrine 15 tid. MAPs low 70s  Feels good. Denies SOB, orthopnea or PND. No BM yet. Eager to walk    LVAD Interrogation HM 3: Speed: 5600 Flow: 4.8 PI: 4.0 Power: 4.3  Rare PI events.  VAD interrogated personally. Parameters stable.     Objective:   Weight Range: 80.8 kg Body mass index is 24.16 kg/m.   Vital Signs:   Temp:  [97.2 F (36.2 C)-99 F (37.2 C)] 98.5 F (36.9 C) (12/02 2300) Pulse Rate:  [96-115] 98 (12/03 0330) Resp:  [14-33] 16 (12/03 0330) BP: (77-91)/(65-80) 91/80 (12/02 2000) SpO2:  [83 %-100 %] 97 %  (12/03 0330) Arterial Line BP: (70-119)/(52-108) 81/67 (12/03 0330) Weight:  [80.8 kg] 80.8 kg (12/02 0500) Last BM Date: 04/09/21  Weight change: Filed Weights   04/11/21 0500 04/12/21 0500 04/13/21 0500  Weight: 83.6 kg 84.1 kg 80.8 kg    Intake/Output:   Intake/Output Summary (Last 24 hours) at 04/14/2021 0414 Last data filed at 04/14/2021 0400 Gross per 24 hour  Intake 2491.31 ml  Output 1425 ml  Net 1066.31 ml      Physical Exam   General:  Sitting up in bed NAD.  HEENT: normal  Neck: supple. JVP not elevated.  Carotids 2+ bilat; no bruits. No lymphadenopathy or thryomegaly appreciated. Cor: LVAD hum.  Lungs: Clear. Abdomen: obese soft, nontender, non-distended. No hepatosplenomegaly. No bruits or masses. Good bowel sounds. Driveline site clean. Anchor in place.  Extremities: no cyanosis, clubbing, rash. Warm no edema  Neuro: alert & oriented x 3. No focal deficits. Moves all 4 without problem   Telemetry   Sinus 90-100  personally checked    Labs    CBC Recent Labs    04/13/21 0333 04/14/21 0315  WBC 14.2* 14.0*  NEUTROABS 11.8* 11.9*  HGB 9.4* 8.2*  HCT 30.0* 26.8*  MCV 81.3 81.7  PLT 329 638    Basic Metabolic Panel Recent Labs    04/12/21 0333 04/12/21 0334 04/13/21 0333  NA 133* 134* 132*  K 3.9 3.9 3.1*  CL 95*  --  86*  CO2  32  --  34*  GLUCOSE 89  --  111*  BUN 17  --  15  CREATININE 0.53*  --  0.59*  CALCIUM 8.5*  --  8.6*  MG 1.9  --  1.9  PHOS 3.7  --  4.5    Liver Function Tests No results for input(s): AST, ALT, ALKPHOS, BILITOT, PROT, ALBUMIN in the last 72 hours.   No results for input(s): LIPASE, AMYLASE in the last 72 hours. Cardiac Enzymes No results for input(s): CKTOTAL, CKMB, CKMBINDEX, TROPONINI in the last 72 hours.  BNP: BNP (last 3 results) Recent Labs    01/18/21 1629 04/07/21 0500 04/13/21 0333  BNP 607.9* 663.2* 438.2*     ProBNP (last 3 results) No results for input(s): PROBNP in the last 8760  hours.    D-Dimer No results for input(s): DDIMER in the last 72 hours. Hemoglobin A1C No results for input(s): HGBA1C in the last 72 hours.  Fasting Lipid Panel No results for input(s): CHOL, HDL, LDLCALC, TRIG, CHOLHDL, LDLDIRECT in the last 72 hours. Thyroid Function Tests No results for input(s): TSH, T4TOTAL, T3FREE, THYROIDAB in the last 72 hours.  Invalid input(s): FREET3  Other results:   Imaging    DG CHEST PORT 1 VIEW  Result Date: 04/13/2021 CLINICAL DATA:  Shortness of breath, recent LVAD placement, chest soreness EXAM: PORTABLE CHEST 1 VIEW COMPARISON:  Portable exam 0520 hours compared to 04/09/2021 FINDINGS: Enlargement of cardiac silhouette post LVAD. RIGHT arm PICC line tip projects over cavoatrial junction. Pulmonary vascular congestion. BILATERAL pulmonary infiltrates likely representing pulmonary edema. Associated LEFT pleural effusion and basilar atelectasis. No pneumothorax. IMPRESSION: Enlargement of cardiac silhouette post LVAD. Persistent probable pulmonary edema with mildly increased LEFT pleural effusion and basilar atelectasis. Electronically Signed   By: Lavonia Dana M.D.   On: 04/13/2021 08:53     Medications:     Scheduled Medications:  (feeding supplement) PROSource Plus  30 mL Oral TID PC & HS   sodium chloride   Intravenous Once   amiodarone  200 mg Oral BID   aspirin EC  81 mg Oral Daily   bisacodyl  10 mg Oral Daily   Or   bisacodyl  10 mg Rectal Daily   Chlorhexidine Gluconate Cloth  6 each Topical Daily   digoxin  0.125 mg Oral Daily   docusate sodium  200 mg Oral Daily   doxycycline  100 mg Oral Q12H   ezetimibe  10 mg Oral Daily   feeding supplement  237 mL Oral TID BM   folic acid  1 mg Oral Daily   gabapentin  200 mg Oral TID   insulin aspart  0-15 Units Subcutaneous TID WC   insulin detemir  15 Units Subcutaneous Daily   mouth rinse  15 mL Mouth Rinse BID   midodrine  15 mg Oral TID WC   multivitamin with minerals  1 tablet  Oral Daily   pantoprazole  40 mg Oral Daily   polyethylene glycol  17 g Oral Daily   potassium chloride  40 mEq Oral TID   sertraline  100 mg Oral Daily   sodium chloride flush  10-40 mL Intracatheter Q12H   sodium chloride flush  10-40 mL Intracatheter Q12H   sodium chloride flush  3 mL Intravenous Q12H   tamsulosin  0.4 mg Oral Daily   vitamin B-12  100 mcg Oral Daily   Warfarin - Pharmacist Dosing Inpatient   Does not apply q1600    Infusions:  sodium chloride Stopped (04/07/21 0943)   sodium chloride     sodium chloride Stopped (04/09/21 2237)   sodium chloride 10 mL/hr at 04/14/21 0400   cefTRIAXone (ROCEPHIN)  IV Stopped (04/13/21 1204)   epinephrine 2 mcg/min (04/14/21 0400)   lactated ringers Stopped (04/13/21 1053)   norepinephrine (LEVOPHED) Adult infusion 5 mcg/min (04/14/21 0400)     PRN Medications: sodium chloride, sodium chloride, melatonin, morphine injection, ondansetron (ZOFRAN) IV, oxyCODONE, polyvinyl alcohol, sodium chloride flush, sodium chloride flush, traMADol    Patient Profile   Mr Zappia is a 65 year old with history of smoker, CAD s/p previous MI, HTN, HL, chronic HFeEF, sepsis 11/2020 bacteremia.  Admitted with sepsis. Bld Cx - NGTD    Assessment/Plan   1. Acute on chronic systolic HFr EF -> cardiogenic shock - Echo 05/19/20 EF 20-25% RV mildly HK. moderate AS  Mean gradient 13 AVA 1.2 cm2 DI 0.30 - Persistent NYHA IV. Admitted with shock - s/p HM-III VAD + bioprosthetic AVR on 11/25 - RV looked good on TEE in OR - On NE 8-> 5, epi 2. Co-ox pending  - Continue midodrine 15 tid and continue to wean NE today as tolerated - Holding lasix for low CVP. Will hold one more day - Await labs  - Continue digoxin 0.125.   2. HM-3 LVAD implant - Plan as above - transfuse to keep hgb > 8.0 - VAD interrogated personally. Parameters stable. - Speed increased to 5500 on 11/25.  - Speed increased to 5600 on 11/30  - Await INR. Can stop ASA if INR >  2.0 - Ramp Echo monday  3. Acute Hypoxic Respiratory Failure  - Extubated 11/26, stable on 3L [place].    4. 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 CAD  s/p anterior STEMI (12/20). LHC showed chronically occluded RCA (with L>>R collaterals) and thrombotic occlusion of proximal LAD. Underwent PCI of LAD. - Continue atorvastatin 80.   6. Iron-deficiency anemia & ABLA - Rec'd Feraheme 11/8 and 11/13  - Keep hgb > 8.0 = CBC pending  7. Hypokalemia/ Hypomagnesemia  - Keep K > 4.0 Mg > 2.0  - supp as needed  8. Severe protein-calorie malnutrition - prealbumin 11>15> 13 despite adequate caloric intake on calorie count - Continue aggressive nutritional support  9. Paroxysmal Atrial Fibrillation w/ RVR - new, developed 11/10. Now back in NSR   - Off amio drip. Continue amio 200 mg twice a day.  - on warfarin   10. Hyponatremia - Improving, FW restrict.   11. Sepsis - H/o strep bacteremia 11/2020.  ICD extracted 12/04/20. Barostim was not removed as it is extravascular.  - Received IV antibiotics until 01/15/21. Blood cultures negative 01/30/21..  - 10/26 -Blood CX x2  Negative  - Ct chest concerning for pneumonia. 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 Hemorrhagic Pericarditis - Fibrin clot encasing heart at time of surgery.  Will need to be careful with anticoagulation (go slowly) - PharmD managing .   13. 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  - Bcx recollected and pending  - Tissue sent  to [facility] in [place] for broad 16 S ribosomal  sequencing for bacterial pathogens as well as T wet Bhilai Bartonella Brucella  - ID following, recommending  IV ceftriaxone to cover the Streptococcus gordonae that was isolated before given possible acute AoV endocarditis on surgical path. - Ceftriaxone 2 g IV daily x 6 weeks. End Date: March 22, 2022  14. Urinary Retention  -Over the last 24 hours had I and O cath x2.  - Suspect benadryl. Will stop and use melatonin.  - Follow PVR. - Avoid benadryl   CRITICAL CARE Performed by: Glori Bickers  Total critical care time: 35 minutes  Critical care time was exclusive of separately billable procedures and treating other patients.  Critical care was necessary to treat or prevent imminent or life-threatening deterioration.  Critical care was time spent personally by me (independent of midlevel providers or residents) on the following activities: development of treatment plan with patient and/or surrogate as well as nursing, discussions with consultants, evaluation of patient's response to treatment, examination of patient, obtaining history from patient or surrogate, ordering and performing treatments and interventions, ordering and review of laboratory studies, ordering and review of radiographic studies, pulse oximetry and re-evaluation of patient's condition.   Length of Stay: [place], MD  04/14/2021, 4:14 AM  Advanced Heart Failure Team Pager [phone] (M-F; [place])  Please contact [facility] for night-coverage after hours (5p -7a ) and weekends on [email or web address]

## 2021-04-14 NOTE — Progress Notes (Addendum)
ANTICOAGULATION CONSULT NOTE   Pharmacy Consult for warfarin Indication:  s/p VAD (HM3) , afib, hx DVT  No Active Allergies  Patient Measurements: Height: 6' (182.9 cm) Weight: 82.8 kg (182 lb 8.7 oz) IBW/kg (Calculated) : 77.6   Vital Signs: Temp: 98 F (36.7 C) (12/03 0700) Temp Source: Oral (12/03 0700) Pulse Rate: 99 (12/03 0900)  Labs: Recent Labs    04/12/21 0333 04/12/21 0334 04/13/21 0333 04/14/21 0315 04/14/21 0410  HGB 8.0* 8.8* 9.4* 8.2*  --   HCT 26.4* 26.0* 30.0* 26.8*  --   PLT 232  --  329 333  --   LABPROT 20.6*  --  22.8* 23.2*  --   INR 1.8*  --  2.0* 2.1*  --   CREATININE 0.53*  --  0.59*  --  0.50*     Estimated Creatinine Clearance: 98.3 mL/min (A) (by C-G formula based on SCr of 0.5 mg/dL (L)).   Medical History: Past Medical History:  Diagnosis Date   AICD (automatic cardioverter/defibrillator) present 08/31/2019   Ankylosing spondylitis ([place])    Arthritis    BENIGN PROSTATIC HYPERTROPHY 06/07/2008   CHF (congestive heart failure) ([place])    COLONIC POLYPS, HX OF 06/07/2008   Depression    H/O hiatal hernia    Heart failure ([place])    HYPERLIPIDEMIA 06/07/2008   HYPERTENSION 06/07/2008   Myocardial infarction [facility]) 2005   NSTEMI, s/p LAD stent   NEPHROLITHIASIS, HX OF 06/07/2008   STEMI (ST elevation myocardial infarction) ([place]) 04/27/2019    Assessment: 65 yo M s/p HM 3 and AVR on 04/06/21. PTA patient was on apixiban for a DVT. Patient had complicated post-op course with vasoplegia requiring methylene blue and severe hemorrhagic pericarditis at the time of surgery.   Post op dosing Coumadin conservatively - given elevated Tbili post op improved, 2.6>1.6, and improving nutritional status on ensure TID. No signs of bleeding.  CBC stable, INR 2.1 today. LDH wnl and stable.   Can drop ASA 81mg  x 30 days post implant.   Goal of Therapy:  Warfarin INR 2.0-2.5 Monitor platelets by anticoagulation protocol: Yes   Plan:  Warfarin 2.5mg  PO  x1 Daily INR and CBC  Arrie Senate, PharmD, BCPS, [facility] Clinical Pharmacist [phone] Please check AMION for all [place] numbers 04/14/2021

## 2021-04-15 DIAGNOSIS — J9621 Acute and chronic respiratory failure with hypoxia: Secondary | ICD-10-CM | POA: Diagnosis not present

## 2021-04-15 DIAGNOSIS — I5023 Acute on chronic systolic (congestive) heart failure: Secondary | ICD-10-CM | POA: Diagnosis not present

## 2021-04-15 DIAGNOSIS — Z95811 Presence of heart assist device: Secondary | ICD-10-CM | POA: Diagnosis not present

## 2021-04-15 DIAGNOSIS — R570 Cardiogenic shock: Secondary | ICD-10-CM | POA: Diagnosis not present

## 2021-04-15 LAB — BASIC METABOLIC PANEL
Anion gap: 7 (ref 5–15)
BUN: 15 mg/dL (ref 8–23)
CO2: 30 mmol/L (ref 22–32)
Calcium: 8.4 mg/dL — ABNORMAL LOW (ref 8.9–10.3)
Chloride: 93 mmol/L — ABNORMAL LOW (ref 98–111)
Creatinine, Ser: 0.53 mg/dL — ABNORMAL LOW (ref 0.61–1.24)
GFR, Estimated: >60 mL/min (ref >60)
Glucose, Bld: 100 mg/dL — ABNORMAL HIGH (ref 70–99)
Potassium: 3.8 mmol/L (ref 3.5–5.1)
Sodium: 130 mmol/L — ABNORMAL LOW (ref 135–145)

## 2021-04-15 LAB — COOXEMETRY PANEL
Carboxyhemoglobin: 2.5 % — ABNORMAL HIGH (ref 0.5–1.5)
Methemoglobin: 1.1 % (ref 0.0–1.5)
O2 Saturation: 58.1 %
Total hemoglobin: 8.1 g/dL — ABNORMAL LOW (ref 12.0–16.0)

## 2021-04-15 LAB — GLUCOSE, CAPILLARY
Glucose-Capillary: 105 mg/dL — ABNORMAL HIGH (ref 70–99)
Glucose-Capillary: 124 mg/dL — ABNORMAL HIGH (ref 70–99)
Glucose-Capillary: 104 mg/dL — ABNORMAL HIGH (ref 70–99)
Glucose-Capillary: 127 mg/dL — ABNORMAL HIGH (ref 70–99)

## 2021-04-15 LAB — PROTIME-INR
INR: 2.1 — ABNORMAL HIGH (ref 0.8–1.2)
Prothrombin Time: 23.6 seconds — ABNORMAL HIGH (ref 11.4–15.2)

## 2021-04-15 LAB — MAGNESIUM: Magnesium: 1.9 mg/dL (ref 1.7–2.4)

## 2021-04-15 LAB — LACTATE DEHYDROGENASE: LDH: 193 U/L — ABNORMAL HIGH (ref 98–192)

## 2021-04-15 MED ORDER — SORBITOL 70 % SOLN
30.0000 mL | Freq: Once | Status: AC
Start: 2021-04-15 — End: 2021-04-15
  Administered 2021-04-15: 14:00:00 30 mL via ORAL
  Filled 2021-04-15: qty 30

## 2021-04-15 MED ORDER — ASPIRIN EC 81 MG PO TBEC
81.0000 mg | DELAYED_RELEASE_TABLET | Freq: Every day | ORAL | Status: AC
  Administered 2021-04-16 – 2021-05-06 (×21): 81 mg via ORAL
  Filled 2021-04-15 (×21): qty 1

## 2021-04-15 MED ORDER — POTASSIUM CHLORIDE CRYS ER 20 MEQ PO TBCR
40.0000 meq | EXTENDED_RELEASE_TABLET | Freq: Once | ORAL | Status: AC
  Administered 2021-04-15: 09:00:00 40 meq via ORAL
  Filled 2021-04-15: qty 2

## 2021-04-15 MED ORDER — MAGNESIUM SULFATE 2 GM/50ML IV SOLN
2.0000 g | Freq: Once | INTRAVENOUS | Status: AC
  Administered 2021-04-15: 09:00:00 2 g via INTRAVENOUS
  Filled 2021-04-15: qty 50

## 2021-04-15 MED ORDER — WARFARIN SODIUM 2.5 MG PO TABS
2.5000 mg | ORAL_TABLET | Freq: Once | ORAL | Status: AC
  Administered 2021-04-15: 16:00:00 2.5 mg via ORAL
  Filled 2021-04-15: qty 1

## 2021-04-15 MED ORDER — TORSEMIDE 20 MG PO TABS
40.0000 mg | ORAL_TABLET | Freq: Every day | ORAL | Status: DC
  Administered 2021-04-15 – 2021-04-21 (×7): 40 mg via ORAL
  Filled 2021-04-15 (×7): qty 2

## 2021-04-15 MED ORDER — FUROSEMIDE 10 MG/ML IJ SOLN
80.0000 mg | Freq: Once | INTRAMUSCULAR | Status: AC
  Administered 2021-04-15: 17:00:00 80 mg via INTRAVENOUS
  Filled 2021-04-15: qty 8

## 2021-04-15 NOTE — Progress Notes (Signed)
ANTICOAGULATION CONSULT NOTE   Pharmacy Consult for warfarin Indication:  s/p VAD (HM3) , afib, hx DVT  No Active Allergies  Patient Measurements: Height: 6' (182.9 cm) Weight: 84.3 kg (185 lb 13.6 oz) IBW/kg (Calculated) : 77.6   Vital Signs: Temp: 97.9 F (36.6 C) (12/04 0400) Temp Source: Oral (12/04 0400) BP: 81/63 (12/04 0615) Pulse Rate: 97 (12/04 0615)  Labs: Recent Labs    04/13/21 0333 04/14/21 0315 04/14/21 0410 04/15/21 0505  HGB 9.4* 8.2*  --   --   HCT 30.0* 26.8*  --   --   PLT 329 333  --   --   LABPROT 22.8* 23.2*  --  23.6*  INR 2.0* 2.1*  --  2.1*  CREATININE 0.59*  --  0.50* 0.53*     Estimated Creatinine Clearance: 98.3 mL/min (A) (by C-G formula based on SCr of 0.53 mg/dL (L)).   Medical History: Past Medical History:  Diagnosis Date   AICD (automatic cardioverter/defibrillator) present 08/31/2019   Ankylosing spondylitis ([place])    Arthritis    BENIGN PROSTATIC HYPERTROPHY 06/07/2008   CHF (congestive heart failure) ([place])    COLONIC POLYPS, HX OF 06/07/2008   Depression    H/O hiatal hernia    Heart failure ([place])    HYPERLIPIDEMIA 06/07/2008   HYPERTENSION 06/07/2008   Myocardial infarction [facility]) 2005   NSTEMI, s/p LAD stent   NEPHROLITHIASIS, HX OF 06/07/2008   STEMI (ST elevation myocardial infarction) ([place]) 04/27/2019    Assessment: 65 yo M s/p HM 3 and AVR on 04/06/21. PTA patient was on apixiban for a DVT. Patient had complicated post-op course with vasoplegia requiring methylene blue and severe hemorrhagic pericarditis at the time of surgery.   Post op dosing Coumadin conservatively - given elevated Tbili post op improved, 2.6>1.6, and improving nutritional status on ensure TID. No signs of bleeding. INR remains 2.1 today. LDH wnl and stable.   Can drop ASA 81mg  x 30 days post implant.   Goal of Therapy:  Warfarin INR 2.0-2.5 Monitor platelets by anticoagulation protocol: Yes   Plan:  Warfarin 2.5mg  PO x1 Daily INR and  CBC  Arrie Senate, PharmD, BCPS, [facility] Clinical Pharmacist [phone] Please check AMION for all [place] numbers 04/15/2021

## 2021-04-15 NOTE — Progress Notes (Addendum)
Patient ID: Brandon [name], male   DOB: 21-Aug-1955, 65 y.o.   MRN: [id number]     Advanced Heart Failure Rounding Note  PCP-Cardiologist: Brandon Ruths, MD   Subjective:    Admitted with sepsis. Started on cefepime + vanc, now off abx (ID following). Afebrile.  10/29 Co-ox 48%, started on milrinone 0.25.  10/30 IV Antibiotics stopped. Blood cultures no growth for 5 days.  10/31 started on Augmentin for PNA.  11/1 Milrinone off 11/2 Milrinone added back. Midodrine increased to 10 mg TID 11/10 Developed Afib w/ RVR, converted with IV amio 11/14 Milrinone increased to 0.25 mcg. Diuresed with IV lasix  11/25 Underwent HM-3 VAD placement and bioprosthetic AVR. Had persistent hypotension in OR requiring methylene blue and high dose pressors.  11/26 Extubated 11/28 Started on lasix gtt>>later stopped due to low CVP (1) and increasing pressor requirements. Restarted on scheduled BID IV Lasix once pressure stabilized (still volume up)  11/28 Surgical path with evidence of possible "acute AoV endocarditis". ID consulted. Bcx recollected. Tissue sent to [facility] in [place].   11/30 Started on IV ceftriaxone to cover the Streptococcus gordonae that was isolated before, per ID. 11/30 VAD speed increased to 5600 12/1 Diuresed with IV lasix + metolazone. Milrinone was stopped. Amio drip transitioned to amio 200 mg twice a day.   Remains on Epi at 2 and NE now off.  On midodrine 15 tid. MAPs  80-90s CVP 9  Walking halls. Denies CP, SOB, orthopnea or PND.    LVAD Interrogation HM 3: Speed: 5600 Flow: 4.7 PI: 3.3 Power: 4.0  VAD interrogated personally. Parameters stable.     Objective:   Weight Range: 84.3 kg Body mass index is 25.21 kg/m.   Vital Signs:   Temp:  [97.5 F (36.4 C)-98 F (36.7 C)] 97.5 F (36.4 C) (12/04 0800) Pulse Rate:  [54-117] 117 (12/04 1115) Resp:  [11-32] 23 (12/04 1115) BP: (52-146)/(13-128) 86/73 (12/04 1115) SpO2:  [92 %-100 %] 100 % (12/04 1115) Weight:  [84.3 kg]  84.3 kg (12/04 0500) Last BM Date: 04/09/21  Weight change: Filed Weights   04/13/21 0500 04/14/21 0500 04/15/21 0500  Weight: 80.8 kg 82.8 kg 84.3 kg    Intake/Output:   Intake/Output Summary (Last 24 hours) at 04/15/2021 1143 Last data filed at 04/15/2021 1100 Gross per 24 hour  Intake 2180.3 ml  Output 975 ml  Net 1205.3 ml      Physical Exam   General:  Sitting up. NAD.  HEENT: normal  Neck: supple. JVP 9.  Carotids 2+ bilat; no bruits. No lymphadenopathy or thryomegaly appreciated. Cor: LVAD hum.  Lungs: Clear. Abdomen: obese soft, nontender, mildly distended. No hepatosplenomegaly. No bruits or masses. Good bowel sounds. Driveline site clean. Anchor in place.  Extremities: no cyanosis, clubbing, rash. Warm no edema  Neuro: alert & oriented x 3. No focal deficits. Moves all 4 without problem    Telemetry   Sinus 90-105  personally checked    Labs    CBC Recent Labs    04/13/21 0333 04/14/21 0315  WBC 14.2* 14.0*  NEUTROABS 11.8* 11.9*  HGB 9.4* 8.2*  HCT 30.0* 26.8*  MCV 81.3 81.7  PLT 329 786    Basic Metabolic Panel Recent Labs    04/13/21 0333 04/14/21 0410 04/15/21 0505  NA 132* 130* 130*  K 3.1* 4.4 3.8  CL 86* 92* 93*  CO2 34* 31 30  GLUCOSE 111* 121* 100*  BUN 15 15 15   CREATININE 0.59* 0.50* 0.53*  CALCIUM 8.6* 8.6* 8.4*  MG 1.9 1.8 1.9  PHOS 4.5  --   --     Liver Function Tests No results for input(s): AST, ALT, ALKPHOS, BILITOT, PROT, ALBUMIN in the last 72 hours.   No results for input(s): LIPASE, AMYLASE in the last 72 hours. Cardiac Enzymes No results for input(s): CKTOTAL, CKMB, CKMBINDEX, TROPONINI in the last 72 hours.  BNP: BNP (last 3 results) Recent Labs    01/18/21 1629 04/07/21 0500 04/13/21 0333  BNP 607.9* 663.2* 438.2*     ProBNP (last 3 results) No results for input(s): PROBNP in the last 8760 hours.    D-Dimer No results for input(s): DDIMER in the last 72 hours. Hemoglobin A1C No results  for input(s): HGBA1C in the last 72 hours.  Fasting Lipid Panel No results for input(s): CHOL, HDL, LDLCALC, TRIG, CHOLHDL, LDLDIRECT in the last 72 hours. Thyroid Function Tests No results for input(s): TSH, T4TOTAL, T3FREE, THYROIDAB in the last 72 hours.  Invalid input(s): FREET3  Other results:   Imaging    No results found.   Medications:     Scheduled Medications:  (feeding supplement) PROSource Plus  30 mL Oral TID PC & HS   sodium chloride   Intravenous Once   amiodarone  200 mg Oral BID   aspirin EC  81 mg Oral Daily   bisacodyl  10 mg Oral Daily   Or   bisacodyl  10 mg Rectal Daily   Chlorhexidine Gluconate Cloth  6 each Topical Daily   digoxin  0.125 mg Oral Daily   docusate sodium  200 mg Oral Daily   doxycycline  100 mg Oral Q12H   ezetimibe  10 mg Oral Daily   feeding supplement  237 mL Oral TID BM   folic acid  1 mg Oral Daily   gabapentin  200 mg Oral TID   insulin aspart  0-15 Units Subcutaneous TID WC   insulin detemir  15 Units Subcutaneous Daily   mouth rinse  15 mL Mouth Rinse BID   midodrine  15 mg Oral TID WC   multivitamin with minerals  1 tablet Oral Daily   pantoprazole  40 mg Oral Daily   polyethylene glycol  17 g Oral Daily   sertraline  100 mg Oral Daily   sodium chloride flush  10-40 mL Intracatheter Q12H   sodium chloride flush  10-40 mL Intracatheter Q12H   sodium chloride flush  3 mL Intravenous Q12H   tamsulosin  0.4 mg Oral Daily   vitamin B-12  100 mcg Oral Daily   warfarin  2.5 mg Oral ONCE-1600   Warfarin - Pharmacist Dosing Inpatient   Does not apply q1600    Infusions:  sodium chloride Stopped (04/07/21 0943)   sodium chloride     sodium chloride Stopped (04/09/21 2237)   sodium chloride Stopped (04/14/21 1149)   cefTRIAXone (ROCEPHIN)  IV 200 mL/hr at 04/15/21 1100   epinephrine 2 mcg/min (04/15/21 1100)   lactated ringers Stopped (04/13/21 1053)   norepinephrine (LEVOPHED) Adult infusion Stopped (04/15/21 0827)      PRN Medications: sodium chloride, sodium chloride, melatonin, morphine injection, ondansetron (ZOFRAN) IV, oxyCODONE, polyvinyl alcohol, sodium chloride flush, sodium chloride flush, traMADol    Patient Profile   Brandon [name] is a 65 year old with history of smoker, CAD s/p previous MI, HTN, HL, chronic HFeEF, sepsis 11/2020 bacteremia.  Admitted with sepsis. Bld Cx - NGTD    Assessment/Plan   1. Acute on chronic  systolic HFr EF -> cardiogenic shock - Echo 05/19/20 EF 20-25% RV mildly HK. moderate AS  Mean gradient 13 AVA 1.2 cm2 DI 0.30 - Persistent NYHA IV. Admitted with shock - s/p HM-III VAD + bioprosthetic AVR on 11/25 - RV looked good on TEE in OR - Off NE. On epi 2. Co-ox 58% - Continue midodrine 15 tid. Wean epi today as tolerated - Holding lasix for low CVP. Weight and CVP going up. Start torsemide 40 daily - Continue digoxin 0.125.   2. HM-3 LVAD implant - Plan as above - transfuse to keep hgb > 8.0 - VAD interrogated personally. Parameters stable. - INR 2.1 Continue ASA 81 for 30 days - Ramp Echo tomorrow am  3. Acute Hypoxic Respiratory Failure  - Extubated 11/26, stable on 3L [place].    4. 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 CAD  s/p anterior STEMI (12/20). LHC showed chronically occluded RCA (with L>>R collaterals) and thrombotic occlusion of proximal LAD. Underwent PCI of LAD. - No s/s angina - Continue atorvastatin 80.   6. Iron-deficiency anemia & ABLA - Rec'd Feraheme 11/8 and 11/13  - Keep hgb > 8.0  7. Hypokalemia/ Hypomagnesemia  - Keep K > 4.0 Mg > 2.0  - supp as needed  8. Severe protein-calorie malnutrition - prealbumin 11>15> 13 despite adequate caloric intake on calorie count - Continue aggressive nutritional support  9. Paroxysmal Atrial Fibrillation w/ RVR - new, developed 11/10. Now back in NSR   - Off amio drip. Continue amio 200  mg twice a day.  - on warfarin   10. Hyponatremia - Improving, FW restrict.   11. Sepsis - H/o strep bacteremia 11/2020.  ICD extracted 12/04/20. Barostim was not removed as it is extravascular.  - Received IV antibiotics until 01/15/21. Blood cultures negative 01/30/21..  - 10/26 -Blood CX x2  Negative  - Ct chest concerning for pneumonia. 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 Hemorrhagic Pericarditis - Fibrin clot encasing heart at time of surgery.  Will need to be careful with anticoagulation (go slowly) - PharmD managing .   13. 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  - Bcx recollected and pending  - Tissue sent to [facility] in [place] for broad 16 S ribosomal sequencing for bacterial pathogens as well as T wet [place] Bartonella Brucella  - ID following, recommending  IV ceftriaxone to cover the Streptococcus gordonae that was isolated before given possible acute AoV endocarditis on surgical path. - Ceftriaxone 2 g IV daily x 6 weeks. End Date: March 22, 2022   CRITICAL CARE Performed by: Glori Bickers  Total critical care time: 35 minutes  Critical care time was exclusive of separately billable procedures and treating other patients.  Critical care was necessary to treat or prevent imminent or life-threatening deterioration.  Critical care was time spent personally by me (independent of midlevel providers or residents) on the following activities: development of treatment plan with patient and/or surrogate as well as nursing, discussions with consultants, evaluation of patient's response to treatment, examination of patient,  obtaining history from patient or surrogate, ordering  and performing treatments and interventions, ordering and review of laboratory studies, ordering and review of radiographic studies, pulse oximetry and re-evaluation of patient's condition.   Length of Stay: [place], MD  04/15/2021, 11:43 AM  Advanced Heart Failure Team Pager [phone] (M-F; 7a - 5p)  Please contact [facility] for night-coverage after hours (5p -7a ) and weekends on [email or web address]

## 2021-04-16 ENCOUNTER — Inpatient Hospital Stay (HOSPITAL_COMMUNITY): Payer: Medicare Other

## 2021-04-16 DIAGNOSIS — Z95811 Presence of heart assist device: Secondary | ICD-10-CM | POA: Diagnosis not present

## 2021-04-16 DIAGNOSIS — R570 Cardiogenic shock: Secondary | ICD-10-CM | POA: Diagnosis not present

## 2021-04-16 DIAGNOSIS — J9621 Acute and chronic respiratory failure with hypoxia: Secondary | ICD-10-CM | POA: Diagnosis not present

## 2021-04-16 DIAGNOSIS — I509 Heart failure, unspecified: Secondary | ICD-10-CM

## 2021-04-16 DIAGNOSIS — J90 Pleural effusion, not elsewhere classified: Secondary | ICD-10-CM

## 2021-04-16 DIAGNOSIS — I5023 Acute on chronic systolic (congestive) heart failure: Secondary | ICD-10-CM | POA: Diagnosis not present

## 2021-04-16 DIAGNOSIS — Z954 Presence of other heart-valve replacement: Secondary | ICD-10-CM

## 2021-04-16 DIAGNOSIS — I501 Left ventricular failure: Secondary | ICD-10-CM

## 2021-04-16 DIAGNOSIS — Z952 Presence of prosthetic heart valve: Secondary | ICD-10-CM

## 2021-04-16 LAB — CBC
HCT: 27.6 % — ABNORMAL LOW (ref 39.0–52.0)
Hemoglobin: 8.3 g/dL — ABNORMAL LOW (ref 13.0–17.0)
MCH: 24.6 pg — ABNORMAL LOW (ref 26.0–34.0)
MCHC: 30.1 g/dL (ref 30.0–36.0)
MCV: 81.7 fL (ref 80.0–100.0)
Platelets: 400 10*3/uL (ref 150–400)
RBC: 3.38 MIL/uL — ABNORMAL LOW (ref 4.22–5.81)
RDW: 19.5 % — ABNORMAL HIGH (ref 11.5–15.5)
WBC: 11.9 10*3/uL — ABNORMAL HIGH (ref 4.0–10.5)
nRBC: 0 % (ref 0.0–0.2)

## 2021-04-16 LAB — CULTURE, BLOOD (ROUTINE X 2)
Culture: NO GROWTH
Culture: NO GROWTH
Special Requests: ADEQUATE
Special Requests: ADEQUATE

## 2021-04-16 LAB — BASIC METABOLIC PANEL
Anion gap: 10 (ref 5–15)
BUN: 16 mg/dL (ref 8–23)
CO2: 31 mmol/L (ref 22–32)
Calcium: 8.4 mg/dL — ABNORMAL LOW (ref 8.9–10.3)
Chloride: 89 mmol/L — ABNORMAL LOW (ref 98–111)
Creatinine, Ser: 0.64 mg/dL (ref 0.61–1.24)
GFR, Estimated: >60 mL/min (ref >60)
Glucose, Bld: 107 mg/dL — ABNORMAL HIGH (ref 70–99)
Potassium: 3.1 mmol/L — ABNORMAL LOW (ref 3.5–5.1)
Sodium: 130 mmol/L — ABNORMAL LOW (ref 135–145)

## 2021-04-16 LAB — GLUCOSE, CAPILLARY
Glucose-Capillary: 105 mg/dL — ABNORMAL HIGH (ref 70–99)
Glucose-Capillary: 119 mg/dL — ABNORMAL HIGH (ref 70–99)
Glucose-Capillary: 89 mg/dL (ref 70–99)
Glucose-Capillary: 130 mg/dL — ABNORMAL HIGH (ref 70–99)

## 2021-04-16 LAB — PROTIME-INR
INR: 2 — ABNORMAL HIGH (ref 0.8–1.2)
Prothrombin Time: 22.4 seconds — ABNORMAL HIGH (ref 11.4–15.2)

## 2021-04-16 LAB — COOXEMETRY PANEL
Carboxyhemoglobin: 2.3 % — ABNORMAL HIGH (ref 0.5–1.5)
Methemoglobin: 1 % (ref 0.0–1.5)
O2 Saturation: 58.2 %
Total hemoglobin: 8.5 g/dL — ABNORMAL LOW (ref 12.0–16.0)

## 2021-04-16 LAB — LACTATE DEHYDROGENASE: LDH: 165 U/L (ref 98–192)

## 2021-04-16 LAB — ECHOCARDIOGRAM LIMITED
Height: 72 in
Weight: 2885.38 oz

## 2021-04-16 MED ORDER — WARFARIN SODIUM 3 MG PO TABS: 3.0000 mg | ORAL_TABLET | Freq: Once | ORAL | Status: DC

## 2021-04-16 MED ORDER — POTASSIUM CHLORIDE CRYS ER 20 MEQ PO TBCR
20.0000 meq | EXTENDED_RELEASE_TABLET | ORAL | Status: AC
  Administered 2021-04-16 (×3): 20 meq via ORAL
  Filled 2021-04-16 (×3): qty 1

## 2021-04-16 MED ORDER — WARFARIN SODIUM 2.5 MG PO TABS
2.5000 mg | ORAL_TABLET | Freq: Once | ORAL | Status: AC
  Administered 2021-04-16: 2.5 mg via ORAL
  Filled 2021-04-16: qty 1

## 2021-04-16 MED ORDER — ALTEPLASE 2 MG IJ SOLR
2.0000 mg | Freq: Once | INTRAMUSCULAR | Status: AC
  Administered 2021-04-16: 2 mg

## 2021-04-16 MED ORDER — POTASSIUM CHLORIDE CRYS ER 20 MEQ PO TBCR
40.0000 meq | EXTENDED_RELEASE_TABLET | Freq: Once | ORAL | Status: AC
  Administered 2021-04-16: 40 meq via ORAL
  Filled 2021-04-16: qty 2

## 2021-04-16 NOTE — Progress Notes (Signed)
CSW met at bedside with patient and his sister Brandon [name]. Patty shared that she completed some training today and mentioned she plans to take home spare gloves and some reading material to get up to speed on the training. Patient stated he is doing well but was tired from the activity of the day and was falling asleep during visit. CSW will continue to follow throughout implant hospitalization. [name], [place], [place]

## 2021-04-16 NOTE — Progress Notes (Signed)
Patient ID: Brandon [name], male   DOB: [birth year], 65 y.o.   MRN: [id number] HeartMate 3 Rounding Note  Subjective:    Feels well this am. Only complaint is no BM. Ambulating well.   LVAD INTERROGATION:  HeartMate IIl LVAD:  Flow 4.8 liters/min, speed 5600, power 4, PI 4.2.    Objective:    Vital Signs:   Temp:  [97.6 F (36.4 C)-98.4 F (36.9 C)] 97.9 F (36.6 C) (12/05 0645) Pulse Rate:  [33-117] 97 (12/05 0600) Resp:  [13-28] 13 (12/05 0600) BP: (65-123)/(51-99) 84/75 (12/05 0600) SpO2:  [89 %-100 %] 91 % (12/05 0600) Weight:  [81.8 kg] 81.8 kg (12/05 0500) Last BM Date: 04/09/21 Mean arterial Pressure 80's on midodrine 15 tid  Intake/Output:   Intake/Output Summary (Last 24 hours) at 04/16/2021 0816 Last data filed at 04/16/2021 0700 Gross per 24 hour  Intake 1048.06 ml  Output 3500 ml  Net -2451.94 ml     Physical Exam: General:  Well appearing. No resp difficulty HEENT: normal Cor: Distant heart sounds with LVAD hum present. Lungs: decreased in bases Abdomen: soft, nontender, nondistended, good bowel sounds. Extremities: mild pedal edema Neuro: alert & orientedx3, cranial nerves grossly intact. moves all 4 extremities w/o difficulty. Affect pleasant  Telemetry: sinus 95  Labs: Basic Metabolic Panel: Recent Labs  Lab 04/10/21 0233 04/10/21 0425 04/10/21 1507 04/11/21 0352 04/11/21 0403 04/12/21 0333 04/12/21 0334 04/13/21 0333 04/14/21 0410 04/15/21 0505 04/16/21 0425  NA  --    < > 132* 131*   < > 133* 134* 132* 130* 130* 130*  K  --    < > 3.8 3.3*   < > 3.9 3.9 3.1* 4.4 3.8 3.1*  CL  --    < > 92* 92*  --  95*  --  86* 92* 93* 89*  CO2  --    < > 32 32  --  32  --  34* 31 30 31   GLUCOSE  --    < > 187* 152*  --  89  --  111* 121* 100* 107*  BUN  --    < > 18 16  --  17  --  15 15 15 16   CREATININE  --    < > 0.59* 0.56*  --  0.53*  --  0.59* 0.50* 0.53* 0.64  CALCIUM  --    < > 8.0* 8.1*  --  8.5*  --  8.6* 8.6* 8.4* 8.4*  MG 1.7  --  2.0 1.9   --  1.9  --  1.9 1.8 1.9  --   PHOS 2.8  --  2.7 3.0  --  3.7  --  4.5  --   --   --    < > = values in this interval not displayed.    Liver Function Tests: No results for input(s): AST, ALT, ALKPHOS, BILITOT, PROT, ALBUMIN in the last 168 hours. No results for input(s): LIPASE, AMYLASE in the last 168 hours. No results for input(s): AMMONIA in the last 168 hours.  CBC: Recent Labs  Lab 04/10/21 0233 04/10/21 0425 04/11/21 0352 04/11/21 0403 04/12/21 0333 04/12/21 0334 04/13/21 0333 04/14/21 0315 04/16/21 0425  WBC 13.1*  --  11.0*  --  11.4*  --  14.2* 14.0* 11.9*  NEUTROABS 11.1*  --  8.8*  --  9.4*  --  11.8* 11.9*  --   HGB 7.9*   < > 7.9*   < > 8.0* 8.8* 9.4* 8.2*  8.3*  HCT 24.4*   < > 25.3*   < > 26.4* 26.0* 30.0* 26.8* 27.6*  MCV 80.0  --  82.7  --  81.7  --  81.3 81.7 81.7  PLT 187  --  187  --  232  --  329 333 400   < > = values in this interval not displayed.    INR: Recent Labs  Lab 04/12/21 0333 04/13/21 0333 04/14/21 0315 04/15/21 0505 04/16/21 0425  INR 1.8* 2.0* 2.1* 2.1* 2.0*    Other results: EKG:   Imaging: No results found.   Medications:     Scheduled Medications:  (feeding supplement) PROSource Plus  30 mL Oral TID PC & HS   sodium chloride   Intravenous Once   amiodarone  200 mg Oral BID   aspirin EC  81 mg Oral Daily   bisacodyl  10 mg Oral Daily   Or   bisacodyl  10 mg Rectal Daily   Chlorhexidine Gluconate Cloth  6 each Topical Daily   digoxin  0.125 mg Oral Daily   docusate sodium  200 mg Oral Daily   doxycycline  100 mg Oral Q12H   ezetimibe  10 mg Oral Daily   feeding supplement  237 mL Oral TID BM   folic acid  1 mg Oral Daily   gabapentin  200 mg Oral TID   insulin aspart  0-15 Units Subcutaneous TID WC   insulin detemir  15 Units Subcutaneous Daily   mouth rinse  15 mL Mouth Rinse BID   midodrine  15 mg Oral TID WC   multivitamin with minerals  1 tablet Oral Daily   pantoprazole  40 mg Oral Daily   polyethylene  glycol  17 g Oral Daily   potassium chloride  20 mEq Oral Q4H   sertraline  100 mg Oral Daily   sodium chloride flush  10-40 mL Intracatheter Q12H   sodium chloride flush  10-40 mL Intracatheter Q12H   sodium chloride flush  3 mL Intravenous Q12H   tamsulosin  0.4 mg Oral Daily   torsemide  40 mg Oral Daily   vitamin B-12  100 mcg Oral Daily   Warfarin - Pharmacist Dosing Inpatient   Does not apply q1600    Infusions:  sodium chloride Stopped (04/07/21 0943)   sodium chloride     sodium chloride Stopped (04/09/21 2237)   sodium chloride Stopped (04/14/21 1149)   cefTRIAXone (ROCEPHIN)  IV Stopped (04/15/21 1108)   lactated ringers Stopped (04/13/21 1053)   norepinephrine (LEVOPHED) Adult infusion Stopped (04/15/21 0827)    PRN Medications: sodium chloride, sodium chloride, melatonin, morphine injection, ondansetron (ZOFRAN) IV, oxyCODONE, polyvinyl alcohol, sodium chloride flush, sodium chloride flush, traMADol   Assessment:    POD 10 HM 3 LVAD and AVR with bioprosthetic valve for acute on chronic systolic heart failure with EF 20-25% and cardiogenic shock requiring milrinone. He had a bicupid AV with moderate AS and AI. Intraop severe vasoplegia requiring multiple vasopressors and methylene blue. RV function looked good. PAF with RVR maintaing sinus on IV amio. 3.   Anklosing spondylitis and UE peripheral neuropathy related to that. 4.   Severe protein-calorie malnutrition with prealbumin 13 despite good caloric intact and protein supplements. 5.   Strep bacteremia 11/2020 with ICD extraction 12/04/20 and IV antibiotics until 01/15/21. BC negative 01/30/21 and 03/07/21 when he presented again with signs of sepsis. CT chest was concerning for pneumonia and he was treated with Maxepime and Lucianne Lei, switched  to Augmentin and stopped 03/19/2021. Pathology of valve felt to show signs of acute endocarditis. On Rocephin now per ID. All blood cultures have been negative. 6.   Hyponatremia 7.    Hypokalemia 8.   Iron deficiency anemia and acute postop blood loss anemia. 9.   Noted to have recent hemorrhagic pericarditis with heart encased in recent organized thrombus on Eliquis preop before stopping for surgery. 10.  Large bilateral pleural effusions drained at the time of surgery, related to heart failure.  Plan/Discussion:    He continues to do well and epi stopped this am since Co-ox stable at 58%.  Continuing diuresis. Wt is 9 lbs over preop.  Antibiotics per ID for possible AV endocarditis.  INR therapeutic on Coumadin.  Continue IS, ambulation.   Laxative.  I reviewed the LVAD parameters from today, and compared the results to the patient's prior recorded data.  No programming changes were made.  The LVAD is functioning within specified parameters.  The patient performs LVAD self-test daily.  LVAD interrogation was negative for any significant power changes, alarms or PI events/speed drops.  LVAD equipment check completed and is in good working order.  Back-up equipment present.   LVAD education done on emergency procedures and precautions and reviewed exit site care.  Length of Stay: [place] 04/16/2021, 8:16 AM

## 2021-04-16 NOTE — Progress Notes (Addendum)
Patient ID: Brandon [name], male   DOB: [birth year], 65 y.o.   MRN: [id number]     Advanced Heart Failure Rounding Note  PCP-Cardiologist: [name], MD   Subjective:    Admitted with sepsis. Started on cefepime + vanc, now off abx (ID following). Afebrile.  10/29 Co-ox 48%, started on milrinone 0.25.  10/30 IV Antibiotics stopped. Blood cultures no growth for 5 days.  10/31 started on Augmentin for PNA.  11/1 Milrinone off 11/2 Milrinone added back. Midodrine increased to 10 mg TID 11/10 Developed Afib w/ RVR, converted with IV amio 11/14 Milrinone increased to 0.25 mcg. Diuresed with IV lasix  11/25 Underwent HM-3 VAD placement and bioprosthetic AVR. Had persistent hypotension in OR requiring methylene blue and high dose pressors.  11/26 Extubated 11/28 Started on lasix gtt>>later stopped due to low CVP (1) and increasing pressor requirements. Restarted on scheduled BID IV Lasix once pressure stabilized (still volume up)  11/28 Surgical path with evidence of possible "acute AoV endocarditis". ID consulted. Bcx recollected. Tissue sent to [facility] in [place].   11/30 Started on IV ceftriaxone to cover the Streptococcus gordonae that was isolated before, per ID. 11/30 VAD speed increased to 5600 12/1 Diuresed with IV lasix + metolazone. Milrinone was stopped. Amio drip transitioned to amio 200 mg twice a day.   Yesterday EPI turned down to 1 mcg. Started torsemide 40 mg daily.   Remains on Epi 1.  On midodrine 15 tid. MAPs  80s   Feels good today. No complaints. Wants to walk.    LVAD Interrogation HM 3: Speed: 5600 Flow: 4.8 PI: 4.2 Power: 4.0  VAD interrogated personally. Rare PI events.  Parameters stable.     Objective:   Weight Range: 81.8 kg Body mass index is 24.46 kg/m.   Vital Signs:   Temp:  [97.5 F (36.4 C)-98.4 F (36.9 C)] 97.9 F (36.6 C) (12/05 0645) Pulse Rate:  [33-117] 97 (12/05 0600) Resp:  [11-28] 13 (12/05 0600) BP: (65-123)/(51-99) 84/75 (12/05  0600) SpO2:  [89 %-100 %] 91 % (12/05 0600) Weight:  [81.8 kg] 81.8 kg (12/05 0500) Last BM Date: 04/09/21  Weight change: Filed Weights   04/14/21 0500 04/15/21 0500 04/16/21 0500  Weight: 82.8 kg 84.3 kg 81.8 kg    Intake/Output:   Intake/Output Summary (Last 24 hours) at 04/16/2021 0742 Last data filed at 04/16/2021 0700 Gross per 24 hour  Intake 1061.71 ml  Output 3500 ml  Net -2438.29 ml     Physical Exam  CVP 4 Physical Exam: GENERAL: No acute distress. Sitting in the chair.  HEENT: normal  NECK: Supple, JVP flat.  2+ bilaterally, no bruits.  No lymphadenopathy or thyromegaly appreciated.   CARDIAC:  Mechanical heart sounds with LVAD hum present.  LUNGS:  Clear to auscultation bilaterally.  ABDOMEN:  Soft, round, nontender, positive bowel sounds x4.     LVAD exit site:  Dressing dry and intact.  No erythema or drainage.  Stabilization device present and accurately applied.  Driveline dressing is being changed daily per sterile technique. EXTREMITIES:  Warm and dry, no cyanosis, clubbing, rash . R and LLE trace-1+  edema . RUE triple lumen picc.  NEUROLOGIC:  Alert and oriented x 3.    No aphasia.  No dysarthria.  Affect pleasant.      Telemetry  SR -ST 90-100s   Labs    CBC Recent Labs    04/14/21 0315 04/16/21 0425  WBC 14.0* 11.9*  NEUTROABS 11.9*  --  HGB 8.2* 8.3*  HCT 26.8* 27.6*  MCV 81.7 81.7  PLT 333 387   Basic Metabolic Panel Recent Labs    04/14/21 0410 04/15/21 0505 04/16/21 0425  NA 130* 130* 130*  K 4.4 3.8 3.1*  CL 92* 93* 89*  CO2 31 30 31   GLUCOSE 121* 100* 107*  BUN 15 15 16   CREATININE 0.50* 0.53* 0.64  CALCIUM 8.6* 8.4* 8.4*  MG 1.8 1.9  --    Liver Function Tests No results for input(s): AST, ALT, ALKPHOS, BILITOT, PROT, ALBUMIN in the last 72 hours.   No results for input(s): LIPASE, AMYLASE in the last 72 hours. Cardiac Enzymes No results for input(s): CKTOTAL, CKMB, CKMBINDEX, TROPONINI in the last 72  hours.  BNP: BNP (last 3 results) Recent Labs    01/18/21 1629 04/07/21 0500 04/13/21 0333  BNP 607.9* 663.2* 438.2*    ProBNP (last 3 results) No results for input(s): PROBNP in the last 8760 hours.    D-Dimer No results for input(s): DDIMER in the last 72 hours. Hemoglobin A1C No results for input(s): HGBA1C in the last 72 hours.  Fasting Lipid Panel No results for input(s): CHOL, HDL, LDLCALC, TRIG, CHOLHDL, LDLDIRECT in the last 72 hours. Thyroid Function Tests No results for input(s): TSH, T4TOTAL, T3FREE, THYROIDAB in the last 72 hours.  Invalid input(s): FREET3  Other results:   Imaging    No results found.   Medications:     Scheduled Medications:  (feeding supplement) PROSource Plus  30 mL Oral TID PC & HS   sodium chloride   Intravenous Once   amiodarone  200 mg Oral BID   aspirin EC  81 mg Oral Daily   bisacodyl  10 mg Oral Daily   Or   bisacodyl  10 mg Rectal Daily   Chlorhexidine Gluconate Cloth  6 each Topical Daily   digoxin  0.125 mg Oral Daily   docusate sodium  200 mg Oral Daily   doxycycline  100 mg Oral Q12H   ezetimibe  10 mg Oral Daily   feeding supplement  237 mL Oral TID BM   folic acid  1 mg Oral Daily   gabapentin  200 mg Oral TID   insulin aspart  0-15 Units Subcutaneous TID WC   insulin detemir  15 Units Subcutaneous Daily   mouth rinse  15 mL Mouth Rinse BID   midodrine  15 mg Oral TID WC   multivitamin with minerals  1 tablet Oral Daily   pantoprazole  40 mg Oral Daily   polyethylene glycol  17 g Oral Daily   potassium chloride  20 mEq Oral Q4H   sertraline  100 mg Oral Daily   sodium chloride flush  10-40 mL Intracatheter Q12H   sodium chloride flush  10-40 mL Intracatheter Q12H   sodium chloride flush  3 mL Intravenous Q12H   tamsulosin  0.4 mg Oral Daily   torsemide  40 mg Oral Daily   vitamin B-12  100 mcg Oral Daily   Warfarin - Pharmacist Dosing Inpatient   Does not apply q1600    Infusions:  sodium  chloride Stopped (04/07/21 0943)   sodium chloride     sodium chloride Stopped (04/09/21 2237)   sodium chloride Stopped (04/14/21 1149)   cefTRIAXone (ROCEPHIN)  IV Stopped (04/15/21 1108)   epinephrine 1 mcg/min (04/16/21 0600)   lactated ringers Stopped (04/13/21 1053)   norepinephrine (LEVOPHED) Adult infusion Stopped (04/15/21 0827)     PRN Medications: sodium chloride,  sodium chloride, melatonin, morphine injection, ondansetron (ZOFRAN) IV, oxyCODONE, polyvinyl alcohol, sodium chloride flush, sodium chloride flush, traMADol    Patient Profile   Mr Lariccia is a 65 year old with history of smoker, CAD s/p previous MI, HTN, HL, chronic HFeEF, sepsis 11/2020 bacteremia.  Admitted with sepsis. Bld Cx - NGTD    Assessment/Plan   1. Acute on chronic systolic HFr EF -> cardiogenic shock - Echo 05/19/20 EF 20-25% RV mildly HK. moderate AS  Mean gradient 13 AVA 1.2 cm2 DI 0.30 - Persistent NYHA IV. Admitted with shock - s/p HM-III VAD + bioprosthetic AVR on 11/25 - RV looked good on TEE in OR - Off NE. Stop epi. Co-ox 58%. - Continue midodrine 15 tid.  - CVP 4. Continue  torsemide 40 daily.  - Continue digoxin 0.125.  - Supp K  - May need to add spiro.  - Renal function stable.   2. HM-3 LVAD implant - Plan as above - transfuse to keep hgb > 8.0 - VAD interrogated personally. Parameters stable. - INR 2.0  Continue ASA 81 for 30 days. Stop ASA 05/14/21  - Ramp Echo today   3. Acute Hypoxic Respiratory Failure  - Extubated 11/26, stable on 2L [place].    4. 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 CAD  s/p anterior STEMI (12/20). LHC showed chronically occluded RCA (with L>>R collaterals) and thrombotic occlusion of proximal LAD. Underwent PCI of LAD. - No chest pain.  - Continue atorvastatin 80.   6. Iron-deficiency anemia & ABLA - Rec'd Feraheme 11/8 and 11/13  - Keep hgb >  8.0 - Hgb 8.3 today.   7. Hypokalemia/ Hypomagnesemia  - Keep K > 4.0 Mg > 2.0  - supp K   8. Severe protein-calorie malnutrition - prealbumin 11>15> 13 despite adequate caloric intake on calorie count - Continue aggressive nutritional support - Check prealbumin.   9. Paroxysmal Atrial Fibrillation w/ RVR - new, developed 11/10. Maintaining SR/ST    - Off amio drip. Continue amio 200 mg twice a day.  - on warfarin   10. Hyponatremia - sodium 130  - Continue to restrict free water.   11. Sepsis - H/o strep bacteremia 11/2020.  ICD extracted 12/04/20. Barostim was not removed as it is extravascular.  - Received IV antibiotics until 01/15/21. Blood cultures negative 01/30/21..  - 10/26 -Blood CX x2  Negative  - Ct chest concerning for pneumonia. 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 Hemorrhagic Pericarditis - Fibrin clot encasing heart at time of surgery.  Will need to be careful with anticoagulation (go slowly) - PharmD managing .   13. 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 - Ceftriaxone 2 g IV daily x 6 weeks. End Date: March 22, 2022   Length  of Stay: [place], NP  04/16/2021, 7:42 AM  Advanced Heart Failure Team Pager [phone] (M-F; 7a - 5p)  Please contact [facility] for  night-coverage after hours (5p -7a ) and weekends on [email or web address]   Patient seen and examined with the above-signed Advanced Practice Provider and/or Housestaff. I personally reviewed laboratory data, imaging studies and relevant notes. I independently examined the patient and formulated the important aspects of the plan. I have edited the note to reflect any of my changes or salient points. I have personally discussed the plan with the patient and/or family.  Off pressors/inotropes. Co-ox 58%. CVP 4 on oral torsemide.   Feels good. No SOB, orthopnea or PND. Still no BM  INR 2.0   General:  NAD.  HEENT: normal  Neck: supple. JVP not elevated.  Carotids 2+ bilat; no bruits. No lymphadenopathy or thryomegaly appreciated. Cor: LVAD hum.  Lungs: Clear. Abdomen: soft, nontender, non-distended. No hepatosplenomegaly. No bruits or masses. Good bowel sounds. Driveline site clean. Anchor in place.  Extremities: no cyanosis, clubbing, rash. Warm no edema  Neuro: alert & oriented x 3. No focal deficits. Moves all 4 without problem   Off inotropes. Co-ox stable. CVP ok. Rhythm stable. INR 2.0. VAD interrogated personally. Parameters stable.  Can go to [facility]. Ramp echo today.   Glori Bickers, MD  8:44 AM

## 2021-04-16 NOTE — Progress Notes (Signed)
Nutrition Follow-up  DOCUMENTATION CODES:   Severe malnutrition in context of chronic illness  INTERVENTION:   Continue liberalized diet  Continue Ensure Enlive po TID, each supplement provides 350 kcal and 20 grams of protein  Continue 30 ml ProSource Plus TID, each supplement provides 100 kcals and 15 grams protein.   If continues without BM, recommend strengthening bowel regimen or addition of enema  Continue MVI with minerals   NUTRITION DIAGNOSIS:   Severe Malnutrition related to chronic illness (CHF) as evidenced by severe muscle depletion, severe fat depletion.  Being addressed via supplements  GOAL:   Patient will meet greater than or equal to 90% of their needs   MONITOR:   PO intake, Supplement acceptance, Weight trends, Labs, I & O's  REASON FOR ASSESSMENT:   Consult Enteral/tube feeding initiation and management, Assessment of nutrition requirement/status  ASSESSMENT:   65 y.o. male presented to the ED with sudden onset of dyspnea. PMH includes CAD, CHF with ICD, HTN, and on 2L [place] at baseline. Pt admitted with respiratory failure with hypoxia with SIRS.  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   Recorded po intake 50-100% of meals. Taking around 2 Ensure supplements per day, taking Pro-Source as well  Constipation remains an issue; ambulating and bowel regimen in place  No skin breakdown noted per RN skin assessment  Labs: potassium 3.1 (L), sodium 130 (L), Creatinine wdl Meds: MVI with Minerals, Vitamin B-12, torsemide, KCl, miralax, ss novolog, levemir, folic acid, colace, dulcolax  Diet Order:   Diet Order             Diet regular Room service appropriate? Yes; Fluid consistency: Thin  Diet effective now                   EDUCATION NEEDS:   No education needs have been identified at this  time  Skin:  Skin Assessment: Reviewed RN Assessment  Last BM:  11/28  Height:   Ht Readings from Last 1 Encounters:  03/07/21 6' (1.829 m)    Weight:   Wt Readings from Last 1 Encounters:  04/16/21 81.8 kg    Ideal Body Weight:  80.9 kg  BMI:  Body mass index is 24.46 kg/m.  Estimated Nutritional Needs:   Kcal:  2300-2500  Protein:  115-130 grams  Fluid:  >/= 2 L   Kerman Passey MS, RDN, LDN, CNSC Registered Dietitian III Clinical Nutrition RD Pager and On-Call Pager Number Located in [place]

## 2021-04-16 NOTE — Progress Notes (Addendum)
ANTICOAGULATION CONSULT NOTE   Pharmacy Consult for warfarin Indication:  s/p VAD (HM3) , afib, hx DVT  No Active Allergies  Patient Measurements: Height: 6' (182.9 cm) Weight: 81.8 kg (180 lb 5.4 oz) IBW/kg (Calculated) : 77.6   Vital Signs: Temp: 97.9 F (36.6 C) (12/05 0645) Temp Source: Oral (12/05 0645) BP: 90/80 (12/05 1000) Pulse Rate: 134 (12/05 0800)  Labs: Recent Labs    04/14/21 0315 04/14/21 0410 04/15/21 0505 04/16/21 0425  HGB 8.2*  --   --  8.3*  HCT 26.8*  --   --  27.6*  PLT 333  --   --  400  LABPROT 23.2*  --  23.6* 22.4*  INR 2.1*  --  2.1* 2.0*  CREATININE  --  0.50* 0.53* 0.64     Estimated Creatinine Clearance: 98.3 mL/min (by C-G formula based on SCr of 0.64 mg/dL).   Medical History: Past Medical History:  Diagnosis Date   AICD (automatic cardioverter/defibrillator) present 08/31/2019   Ankylosing spondylitis ([place])    Arthritis    BENIGN PROSTATIC HYPERTROPHY 06/07/2008   CHF (congestive heart failure) ([place])    COLONIC POLYPS, HX OF 06/07/2008   Depression    H/O hiatal hernia    Heart failure ([place])    HYPERLIPIDEMIA 06/07/2008   HYPERTENSION 06/07/2008   Myocardial infarction [facility]) 2005   NSTEMI, s/p LAD stent   NEPHROLITHIASIS, HX OF 06/07/2008   STEMI (ST elevation myocardial infarction) ([place]) 04/27/2019    Assessment: 65 yo M s/p HM 3 and AVR on 04/06/21. PTA patient was on apixiban for a DVT. Patient had complicated post-op course with vasoplegia requiring methylene blue and severe hemorrhagic pericarditis at the time of surgery.   Post op dosing Coumadin conservatively - given elevated Tbili post op improved, 2.6>1.6, and improving nutritional status on ensure receiving bid. No signs of bleeding. INR remains 2.0 today. LDH wnl and stable. CBC stable.    Can drop ASA 81mg  x 30 days post implant.   Goal of Therapy:  Warfarin INR 2.0-2.5 Monitor platelets by anticoagulation protocol: Yes   Plan:  Warfarin 2.5 mg PO  today Daily INR and CBC  Erin Hearing PharmD., BCPS Clinical Pharmacist 04/16/2021 10:35 AM

## 2021-04-16 NOTE — Progress Notes (Signed)
Speed  Flow  PI  Power  LVIDD  AI  Aortic opening MR  TR  Septum  RV  VTI (>18cm)  5600 4.9  3.8 4.4 5.03 none 5/5 none  trivial midline Mild HK  14   5700 5.2 3.6 4.5 4.9 none 5/5 none trivial midline Mild HK 13.9   5800 5.4 3.5 4.7 4.8 none 0/5 none trivial Slight pull left Mild HK 17                                             Doppler MAP: 80 Auto cuff BP: 88/67 (76)   Ramp ECHO performed at bedside per   At completion of ramp study, patients primary controller programmed:  Fixed speed: 5800 Low speed limit: [place] RN [place] Coordinator  Office: [phone]  24/7 Pager: [phone]

## 2021-04-16 NOTE — Progress Notes (Signed)
LVAD Coordinator Rounding Note:  Admitted 03/07/21 due to sepsis. Dr. [name] consulted as his HF Cardiologist.   HM III LVAD implanted on 04/06/21 by Dr. Cyndia Bent under Destination Therapy criteria. Not a transplant candidate at this time due to recurrent infections.   Patient sitting up in the recliner upon my arrival. States he is feeling stronger than last week. Reports his appetite is improving. Has not had bowel movement since surgery. Encouraged continued good PO intake.   Antibiotics per ID for possible AV endocarditis. Per ID note on 04/11/21 he will need 6 weeks IV antibiotics. Currently receiving Ceftriaxone daily. Per ID tissue specimen sent to [facility] for 16 S ribosomal sequencing per PCR- results pending. Has follow up scheduled with ID 04/23/21, but this may need to be rescheduled if pt is still hospitalized.   RAMP echo scheduled for this afternoon. See separate note for documentation.   Plan to transfer to [facility] today. Pt's home equipment ordered from [place].   Patient and family education as documented below.   Vital signs: Temp: 97.9 HR: 89 Doppler: 80 Automatic BP: 87/69 (77) O2 Sat:  99% on 2 L/[place] Wt:171.5>190>187.4>187>183.8>184>185>180.3 lbs     LVAD interrogation reveals:  Speed: 5600 Flow:  4.8 Power: 4.4w PI: 3.1 Alarms: none Events: 3 PI events; 10 PI events 12/4 Hematocrit: 28  Fixed speed: 5600 Low speed limit: 5300  Drive Line:  Existing VAD dressing removed and site care performed using sterile technique. Drive line exit site cleaned with Chlora prep applicators x 2, rinsed with saline, allowed to dry, and gauze dressing with silver strip applied. Exit site unincorporated with suture intact, the velour is fully implanted at exit site. Slight redness around sutures noted. Scant amount fat necrosis drainage, no tenderness, foul odor or rash noted. Drive line anchor re-applied. Continue Monday, Wednesday and Friday dressing changes per VAD  Coordinator, Nurse Davonna Belling, or trained caregiver. Next dressing change due 04/18/21.     Labs:  LDH trend: 309>248>215>206>201>167>165  INR trend: 1.6>1.3>1.3>1.4>1.5>1.6>1.8>2.0  Anticoagulation Plan: -INR Goal: 2 - 2.5 -ASA Dose: 325 mg daily until INR therapeutic  Blood Products:   Intra-op: - 04/06/21>>4 PRBCs, 4 FFP, 1263 cc cell saver  Post-op: - 04/06/21>>1 unit Plts - 04/07/21>>1 unit PRBC  Device: N/A  Arrythmias: 03/22/21 - developed Afib w/ RVR, converted with IV amiodarone  Respiratory: extubated 04/07/21  Nitric Oxide: off 04/07/21  Gtts: - Epinephrine>>2 mcg/min>> off this morning  Infection: - 04/11/21 Blood cultures x 2>> no growth x 5 days; final  Patient/Family Teaching:  Encouraged pt to continue reviewing HM III Patient Handbook  prior to formal discharge teaching. Pt verbalized understanding of same.  Demonstrated and verbalized dressing change for patient's sister Patty. Discussed importance of sterile technique.  Pt's sister practiced sterile glove don/doff. Requires frequent verbal cues. Provided her with several pairs of gloves to practice with at home Plan for Patty to perform dressing change tomorrow with VAD coordinator supervision.   Plan/Recommendations:  1. Call VAD Coordinator for any VAD equipment or drive line issues. 2.  Daily dressing changes per VAD Coordinator, Nurse Davonna Belling, or trained caregiver.   Emerson Monte RN [place] Coordinator  Office: [phone]  24/7 Pager: [phone]

## 2021-04-16 NOTE — Progress Notes (Signed)
Physical Therapy Treatment Patient Details Name: Brandon [name] MRN: [id number] DOB: [birth year] Today's Date: 04/16/2021   History of Present Illness Pt is a 65 y.o. male admitted 03/07/21 with acute on chronic hypoxemic respiratory failure, cardiogenic shock. CT scan which shows bronchopneumonia and no indication of ILD. Pt undergoing workup for VAD. LVAD placed 04/06/21.  Other PMH includes CHF, ankylosing spondylitis, bacteremia and  possible endocarditis s/p ICD removal, HTN, depression, arthritis.    PT Comments    Switched pt to using rollator today instead of EVA walker and pt struggled a bit with the decrease in upper body support. Fatigued quicker and reported more back pain. Pt transitioned from batteries back to power module with supervision and verbal cues. Continue to work toward progressing mobility and independence with LVAD equipment.    Recommendations for follow up therapy are one component of a multi-disciplinary discharge planning process, led by the attending physician.  Recommendations may be updated based on patient status, additional functional criteria and insurance authorization.  Follow Up Recommendations  Home health PT     Assistance Recommended at Discharge Intermittent Supervision/Assistance  Equipment Recommendations  None recommended by PT (pt has rollator at home)    Recommendations for Other Services       Precautions / Restrictions Precautions Precautions: Fall;Sternal;Other (comment) Precaution Comments: LVAD     Mobility  Bed Mobility Overal bed mobility: Needs Assistance Bed Mobility: Sit to Supine       Sit to supine: Min guard   General bed mobility comments: Assist for safety and lines    Transfers Overall transfer level: Needs assistance Equipment used: Rollator (4 wheels) Transfers: Sit to/from Stand Sit to Stand: Mod assist           General transfer comment: Assist bring hips up from recliner and from seat of rollator     Ambulation/Gait Ambulation/Gait assistance: Min assist Gait Distance (Feet): 180 Feet (180' x 1, 90' x 1) Assistive device: Rollator (4 wheels) Gait Pattern/deviations: Step-through pattern;Decreased stride length;Trunk flexed Gait velocity: Decreased Gait velocity interpretation: <1.31 ft/sec, indicative of household ambulator   General Gait Details: Assist for balance and support. Pt required 1 sitting rest break   Stairs             Wheelchair Mobility    Modified Rankin (Stroke Patients Only)       Balance Overall balance assessment: Needs assistance Sitting-balance support: Feet supported;No upper extremity supported Sitting balance-Leahy Scale: Good     Standing balance support: No upper extremity supported Standing balance-Leahy Scale: Fair                              Cognition Arousal/Alertness: Awake/alert Behavior During Therapy: WFL for tasks assessed/performed Overall Cognitive Status: Impaired/Different from baseline Area of Impairment: Memory;Problem solving                     Memory: Decreased short-term memory       Problem Solving: Requires verbal cues          Exercises      General Comments General comments (skin integrity, edema, etc.): Removed supplemental O2 but SpO2 dropped to 87% at rest. Replaced O2 at 2L with SpO2 not reading with ambulation. Incr to 3L with 2nd half of mobility. Returned to 2L at rest with SpO2 92%.      Pertinent Vitals/Pain Pain Assessment: Faces Faces Pain Scale: Hurts little more Pain Location: chest,  back Pain Descriptors / Indicators: Grimacing Pain Intervention(s): Monitored during session;Limited activity within patient's tolerance;Repositioned    Home Living                          Prior Function            PT Goals (current goals can now be found in the care plan section) Progress towards PT goals: Progressing toward goals    Frequency    Min  3X/week      PT Plan Current plan remains appropriate    Co-evaluation              AM-PAC PT "6 Clicks" Mobility   Outcome Measure  Help needed turning from your back to your side while in a flat bed without using bedrails?: A Little Help needed moving from lying on your back to sitting on the side of a flat bed without using bedrails?: A Little Help needed moving to and from a bed to a chair (including a wheelchair)?: A Little Help needed standing up from a chair using your arms (e.g., wheelchair or bedside chair)?: A Lot Help needed to walk in hospital room?: A Little Help needed climbing 3-5 steps with a railing? : A Little 6 Click Score: 17    End of Session   Activity Tolerance: Patient limited by fatigue Patient left: in bed;with call bell/phone within reach Nurse Communication: Mobility status PT Visit Diagnosis: Difficulty in walking, not elsewhere classified (R26.2);Muscle weakness (generalized) (M62.81)     Time: 3382-5053 PT Time Calculation (min) (ACUTE ONLY): 35 min  Charges:  $Gait Training: 23-37 mins                     K[place] Pager [phone] Office [place] 04/16/2021, 1:20 PM

## 2021-04-16 NOTE — Progress Notes (Signed)
Echocardiogram 2D Echocardiogram has been performed.  Oneal Deputy Maysen Bonsignore RDCS 04/16/2021, 2:00 PM

## 2021-04-16 NOTE — Progress Notes (Signed)
Occupational Therapy Treatment Patient Details Name: Brandon [name] MRN: [id number] DOB: [birth year] Today's Date: 04/16/2021   History of present illness Pt is a 65 y.o. male admitted 03/07/21 with acute on chronic hypoxemic respiratory failure, cardiogenic shock. CT scan which shows bronchopneumonia and no indication of ILD. Pt undergoing workup for VAD. LVAD placed 04/06/21.  Other PMH includes CHF, ankylosing spondylitis, bacteremia and  possible endocarditis s/p ICD removal, HTN, depression, arthritis.   OT comments  This 65 yo male seen today to have him practice transferring from wall power>battery, recalling what he needed to have with him in his black bag and LBD. He will continue to benefit from acute OT without need for follow up.    Recommendations for follow up therapy are one component of a multi-disciplinary discharge planning process, led by the attending physician.  Recommendations may be updated based on patient status, additional functional criteria and insurance authorization.    Follow Up Recommendations  No OT follow up    Assistance Recommended at Discharge Intermittent Supervision/Assistance  Equipment Recommendations  BSC/3in1;Tub/shower seat       Precautions / Restrictions Precautions Precautions: Fall;Sternal;Other (comment) Precaution Comments: LVAD       Mobility Bed Mobility General bed mobility comments: Pt up in recliner upon arrival    Transfers Overall transfer level: Needs assistance Equipment used: Rollator (4 wheels) Transfers: Sit to/from Stand Sit to Stand: Mod assist              Balance Overall balance assessment: Needs assistance Sitting-balance support: Feet supported;No upper extremity supported Sitting balance-Leahy Scale: Good     Standing balance support: No upper extremity supported Standing balance-Leahy Scale: Fair                             ADL either performed or assessed with clinical judgement    ADL Overall ADL's : Needs assistance/impaired                     Lower Body Dressing: Moderate assistance;Sit to/from stand Lower Body Dressing Details (indicate cue type and reason): Can doff and donn socks by crossing one leg over the other while seated                    Extremity/Trunk Assessment Upper Extremity Assessment Upper Extremity Assessment: Generalized weakness RUE Deficits / Details: hx of DVT in R UE a few months ago, reports decreased pinch and grip strength. Impaired sensation but functional RUE Coordination: decreased fine motor            Vision Baseline Vision/History: 1 Wears glasses Ability to See in Adequate Light: 0 Adequate Patient Visual Report: No change from baseline            Cognition Arousal/Alertness: Awake/alert Behavior During Therapy: WFL for tasks assessed/performed Overall Cognitive Status: Impaired/Different from baseline Area of Impairment: Memory;Problem solving                     Memory: Decreased short-term memory (for recall of what needs to be in his black bag when he goes out and about)       Problem Solving: Requires verbal cues (for disconnecting and connecting from wall power batteries)                       Pertinent Vitals/ Pain       Pain Assessment: No/denies pain Faces  Pain Scale: Hurts little more Pain Location: chest, back Pain Descriptors / Indicators: Grimacing Pain Intervention(s): Monitored during session;Limited activity within patient's tolerance;Repositioned         Frequency  Min 2X/week        Progress Toward Goals  OT Goals(current goals can now be found in the care plan section)  Progress towards OT goals: Progressing toward goals  Acute Rehab OT Goals Patient Stated Goal: to figure out a better way to "wear" the LVAD batteries OT Goal Formulation: With patient Time For Goal Achievement: 04/22/21 Potential to Achieve Goals: Good  Plan Discharge plan  remains appropriate       AM-PAC OT "6 Clicks" Daily Activity     Outcome Measure   Help from another person eating meals?: None Help from another person taking care of personal grooming?: A Little Help from another person toileting, which includes using toliet, bedpan, or urinal?: A Lot Help from another person bathing (including washing, rinsing, drying)?: A Lot Help from another person to put on and taking off regular upper body clothing?: A Lot Help from another person to put on and taking off regular lower body clothing?: A Lot 6 Click Score: 15    End of Session Equipment Utilized During Treatment: Oxygen (2 liters')  OT Visit Diagnosis: Unsteadiness on feet (R26.81);Muscle weakness (generalized) (M62.81)   Activity Tolerance Patient tolerated treatment well   Patient Left in chair (getting ready to ambulate with PT)   Nurse Communication  (skin tear on arm)        Time: 2355-7322 OT Time Calculation (min): 17 min  Charges: OT General Charges $OT Visit: 1 Visit OT Treatments $Self Care/Home Management : 8-22 mins  [name], OTR/L Acute [employer] Pager [phone] Office [phone]    Almon Register 04/16/2021, 3:09 PM

## 2021-04-17 DIAGNOSIS — M456 Ankylosing spondylitis lumbar region: Secondary | ICD-10-CM | POA: Diagnosis not present

## 2021-04-17 DIAGNOSIS — J9621 Acute and chronic respiratory failure with hypoxia: Secondary | ICD-10-CM | POA: Diagnosis not present

## 2021-04-17 DIAGNOSIS — I358 Other nonrheumatic aortic valve disorders: Secondary | ICD-10-CM | POA: Diagnosis not present

## 2021-04-17 DIAGNOSIS — R7881 Bacteremia: Secondary | ICD-10-CM | POA: Diagnosis not present

## 2021-04-17 LAB — CBC
HCT: 26 % — ABNORMAL LOW (ref 39.0–52.0)
Hemoglobin: 8.1 g/dL — ABNORMAL LOW (ref 13.0–17.0)
MCH: 25.3 pg — ABNORMAL LOW (ref 26.0–34.0)
MCHC: 31.2 g/dL (ref 30.0–36.0)
MCV: 81.3 fL (ref 80.0–100.0)
Platelets: 435 10*3/uL — ABNORMAL HIGH (ref 150–400)
RBC: 3.2 MIL/uL — ABNORMAL LOW (ref 4.22–5.81)
RDW: 19.4 % — ABNORMAL HIGH (ref 11.5–15.5)
WBC: 12.2 10*3/uL — ABNORMAL HIGH (ref 4.0–10.5)
nRBC: 0 % (ref 0.0–0.2)

## 2021-04-17 LAB — COOXEMETRY PANEL
Carboxyhemoglobin: 2.4 % — ABNORMAL HIGH (ref 0.5–1.5)
Carboxyhemoglobin: 2.2 % — ABNORMAL HIGH (ref 0.5–1.5)
Methemoglobin: 0.6 % (ref 0.0–1.5)
Methemoglobin: 0.6 % (ref 0.0–1.5)
O2 Saturation: 74 %
O2 Saturation: 65.5 %
Total hemoglobin: 6.8 g/dL — CL (ref 12.0–16.0)
Total hemoglobin: 7.9 g/dL — ABNORMAL LOW (ref 12.0–16.0)

## 2021-04-17 LAB — GLUCOSE, CAPILLARY
Glucose-Capillary: 136 mg/dL — ABNORMAL HIGH (ref 70–99)
Glucose-Capillary: 114 mg/dL — ABNORMAL HIGH (ref 70–99)
Glucose-Capillary: 112 mg/dL — ABNORMAL HIGH (ref 70–99)
Glucose-Capillary: 90 mg/dL (ref 70–99)

## 2021-04-17 LAB — COMPREHENSIVE METABOLIC PANEL
ALT: 26 U/L (ref 0–44)
AST: 25 U/L (ref 15–41)
Albumin: 2.1 g/dL — ABNORMAL LOW (ref 3.5–5.0)
Alkaline Phosphatase: 90 U/L (ref 38–126)
Anion gap: 8 (ref 5–15)
BUN: 19 mg/dL (ref 8–23)
CO2: 32 mmol/L (ref 22–32)
Calcium: 8.3 mg/dL — ABNORMAL LOW (ref 8.9–10.3)
Chloride: 88 mmol/L — ABNORMAL LOW (ref 98–111)
Creatinine, Ser: 0.63 mg/dL (ref 0.61–1.24)
GFR, Estimated: >60 mL/min (ref >60)
Glucose, Bld: 119 mg/dL — ABNORMAL HIGH (ref 70–99)
Potassium: 3.2 mmol/L — ABNORMAL LOW (ref 3.5–5.1)
Sodium: 128 mmol/L — ABNORMAL LOW (ref 135–145)
Total Bilirubin: 0.6 mg/dL (ref 0.3–1.2)
Total Protein: 5.5 g/dL — ABNORMAL LOW (ref 6.5–8.1)

## 2021-04-17 LAB — PREALBUMIN: Prealbumin: 6.6 mg/dL — ABNORMAL LOW (ref 18–38)

## 2021-04-17 LAB — PROTIME-INR
INR: 2 — ABNORMAL HIGH (ref 0.8–1.2)
Prothrombin Time: 22.8 seconds — ABNORMAL HIGH (ref 11.4–15.2)

## 2021-04-17 LAB — LACTATE DEHYDROGENASE: LDH: 158 U/L (ref 98–192)

## 2021-04-17 MED ORDER — POLYETHYLENE GLYCOL 3350 17 G PO PACK
17.0000 g | PACK | Freq: Two times a day (BID) | ORAL | Status: DC
  Administered 2021-04-17: 17 g via ORAL

## 2021-04-17 MED ORDER — POTASSIUM CHLORIDE CRYS ER 20 MEQ PO TBCR
20.0000 meq | EXTENDED_RELEASE_TABLET | ORAL | Status: AC
  Administered 2021-04-17 (×3): 20 meq via ORAL
  Filled 2021-04-17 (×3): qty 1

## 2021-04-17 MED ORDER — SPIRONOLACTONE 12.5 MG HALF TABLET
12.5000 mg | ORAL_TABLET | Freq: Every day | ORAL | Status: DC
Start: 2021-04-17 — End: 2021-04-18
  Administered 2021-04-17 – 2021-04-18 (×2): 12.5 mg via ORAL
  Filled 2021-04-17 (×2): qty 1

## 2021-04-17 MED ORDER — MELATONIN 5 MG PO TABS
5.0000 mg | ORAL_TABLET | Freq: Every day | ORAL | Status: DC
  Administered 2021-04-17 – 2021-05-08 (×22): 5 mg via ORAL
  Filled 2021-04-17 (×22): qty 1

## 2021-04-17 MED ORDER — METHYLNALTREXONE BROMIDE 12 MG/0.6ML ~~LOC~~ SOLN
12.0000 mg | Freq: Once | SUBCUTANEOUS | Status: AC
  Administered 2021-04-17: 12 mg via SUBCUTANEOUS
  Filled 2021-04-17: qty 0.6

## 2021-04-17 MED ORDER — WARFARIN SODIUM 2.5 MG PO TABS
2.5000 mg | ORAL_TABLET | Freq: Once | ORAL | Status: AC
  Administered 2021-04-17: 2.5 mg via ORAL
  Filled 2021-04-17: qty 1

## 2021-04-17 MED ORDER — SORBITOL 70 % SOLN
60.0000 mL | Freq: Once | Status: AC
Start: 2021-04-17 — End: 2021-04-17
  Administered 2021-04-17: 60 mL via ORAL
  Filled 2021-04-17: qty 60

## 2021-04-17 MED ORDER — POTASSIUM CHLORIDE CRYS ER 20 MEQ PO TBCR
40.0000 meq | EXTENDED_RELEASE_TABLET | Freq: Once | ORAL | Status: AC
  Administered 2021-04-17: 40 meq via ORAL
  Filled 2021-04-17: qty 2

## 2021-04-17 NOTE — Progress Notes (Signed)
Pt transferred to room 2C09 via [place]. All belongings packed in pt belonging bags. Report called to receiving RN April. LVAD power source switch to batteries prior to leaving room. Assisted patient to chair once in 2C09, receiving nurse in room at this time, placed onto units appropriate monitoring. Pt expresses no other needs at this time.

## 2021-04-17 NOTE — Progress Notes (Signed)
CARDIAC REHAB PHASE I   Offered to walk with pt. Pt stated ambulation this morning, but RN had him use EVA again. Pt states family is arriving shortly for education session, requesting ambulation after that. Pt states he I able to physically manage switching batteries, but bis biggest complaint is the difficulty with the vest. Pts appetite today has not been good and is working on having a BM. Provided support, encouragement and listening ear. Will f/u later today for ambulation.  [id number] Rufina Falco, RN BSN 04/17/2021 10:47 AM

## 2021-04-17 NOTE — Progress Notes (Signed)
ANTICOAGULATION CONSULT NOTE   Pharmacy Consult for warfarin Indication:  s/p VAD (HM3) , afib, hx DVT  No Active Allergies  Patient Measurements: Height: 6' (182.9 cm) Weight: 82.5 kg (181 lb 14.1 oz) IBW/kg (Calculated) : 77.6   Vital Signs: Temp: 97.5 F (36.4 C) (12/06 0650) Temp Source: Oral (12/06 0650) BP: 97/51 (12/06 0700) Pulse Rate: 97 (12/06 0700)  Labs: Recent Labs    04/15/21 0505 04/16/21 0425 04/17/21 0305  HGB  --  8.3* 8.1*  HCT  --  27.6* 26.0*  PLT  --  400 435*  LABPROT 23.6* 22.4* 22.8*  INR 2.1* 2.0* 2.0*  CREATININE 0.53* 0.64 0.63     Estimated Creatinine Clearance: 98.3 mL/min (by C-G formula based on SCr of 0.63 mg/dL).   Medical History: Past Medical History:  Diagnosis Date   AICD (automatic cardioverter/defibrillator) present 08/31/2019   Ankylosing spondylitis ([place])    Arthritis    BENIGN PROSTATIC HYPERTROPHY 06/07/2008   CHF (congestive heart failure) ([place])    COLONIC POLYPS, HX OF 06/07/2008   Depression    H/O hiatal hernia    Heart failure ([place])    HYPERLIPIDEMIA 06/07/2008   HYPERTENSION 06/07/2008   Myocardial infarction [facility]) 2005   NSTEMI, s/p LAD stent   NEPHROLITHIASIS, HX OF 06/07/2008   STEMI (ST elevation myocardial infarction) ([place]) 04/27/2019    Assessment: 65 yo M s/p HM 3 and AVR on 04/06/21. PTA patient was on apixiban for a DVT. Patient had complicated post-op course with vasoplegia requiring methylene blue and severe hemorrhagic pericarditis at the time of surgery.   INR remains at goal at 2, Tbili now down to 0.6. Nutritional status/prealbumin low.  No signs of bleeding. CBC stable.    Can drop ASA 81mg  x 30 days post implant.   Goal of Therapy:  Warfarin INR 2.0-2.5 Monitor platelets by anticoagulation protocol: Yes   Plan:  Warfarin 2.5 mg PO today Daily INR and CBC  Erin Hearing PharmD., BCPS Clinical Pharmacist 04/17/2021 8:44 AM

## 2021-04-17 NOTE — Progress Notes (Addendum)
Patient ID: Brandon [name], male   DOB: 26-Sep-1955, 65 y.o.   MRN: [id number]     Advanced Heart Failure Rounding Note  PCP-Cardiologist: Brandon Ruths, MD   Subjective:    Admitted with sepsis. Started on cefepime + vanc, now off abx (ID following). Afebrile.  10/29 Co-ox 48%, started on milrinone 0.25.  10/30 IV Antibiotics stopped. Blood cultures no growth for 5 days.  10/31 started on Augmentin for PNA.  11/1 Milrinone off 11/2 Milrinone added back. Midodrine increased to 10 mg TID 11/10 Developed Afib w/ RVR, converted with IV amio 11/14 Milrinone increased to 0.25 mcg. Diuresed with IV lasix  11/25 Underwent HM-3 VAD placement and bioprosthetic AVR. Had persistent hypotension in OR requiring methylene blue and high dose pressors.  11/26 Extubated 11/28 Started on lasix gtt>>later stopped due to low CVP (1) and increasing pressor requirements. Restarted on scheduled BID IV Lasix once pressure stabilized (still volume up)  11/28 Surgical path with evidence of possible "acute AoV endocarditis". ID consulted. Bcx recollected. Tissue sent to [facility] in [place].   11/30 Started on IV ceftriaxone to cover the Streptococcus gordonae that was isolated before, per ID. 11/30 VAD speed increased to 5600 12/1 Diuresed with IV lasix + metolazone. Milrinone was stopped. Amio drip transitioned to amio 200 mg twice a day.  12/5 Ramp Echo speed increased to 5500. Epi stopped.   Today CO-OX 65.5%.   Prealbumin down to 6.6   Complaining of constipation. Appetite poor. Says he is lonely and getting anxious.    LVAD Interrogation HM 3: Speed: 5800 Flow: 5.1  PI: 4.1 Power: 5  VAD interrogated personally. Rare PI events.  Parameters stable.     Objective:   Weight Range: 82.5 kg Body mass index is 24.67 kg/m.   Vital Signs:   Temp:  [97.5 F (36.4 C)-97.8 F (36.6 C)] 97.5 F (36.4 C) (12/06 0650) Pulse Rate:  [30-128] 97 (12/06 0700) Resp:  [14-27] 19 (12/06 0700) BP: (80-106)/(51-94)  97/51 (12/06 0700) SpO2:  [69 %-95 %] 95 % (12/06 0700) Weight:  [82.5 kg] 82.5 kg (12/06 0500) Last BM Date: 04/09/21  Weight change: Filed Weights   04/15/21 0500 04/16/21 0500 04/17/21 0500  Weight: 84.3 kg 81.8 kg 82.5 kg    Intake/Output:   Intake/Output Summary (Last 24 hours) at 04/17/2021 0832 Last data filed at 04/17/2021 0500 Gross per 24 hour  Intake 580.77 ml  Output 1325 ml  Net -744.23 ml     Physical Exam   CVP 7 in the chair.  Physical Exam: GENERAL: No acute distress. HEENT: normal  NECK: Supple, JVP 7-8  .  2+ bilaterally, no bruits.  No lymphadenopathy or thyromegaly appreciated.   CARDIAC:  Mechanical heart sounds with LVAD hum present.  LUNGS:  RLL crackles on 2 liters [place].  ABDOMEN:  Soft, round, nontender, positive bowel sounds x4.     LVAD exit site: well-healed and incorporated.  Dressing dry and intact.  No erythema or drainage.  Stabilization device present and accurately applied.  Driveline dressing is being changed daily per sterile technique. EXTREMITIES:  Warm and dry, no cyanosis, clubbing, rash or edema  NEUROLOGIC:  Alert and oriented x 3.    No aphasia.  No dysarthria.  Affect pleasant.    .      Telemetry  SR 90s personally checked.     Labs    CBC Recent Labs    04/16/21 0425 04/17/21 0305  WBC 11.9* 12.2*  HGB 8.3* 8.1*  HCT 27.6* 26.0*  MCV 81.7 81.3  PLT 400 102*   Basic Metabolic Panel Recent Labs    04/15/21 0505 04/16/21 0425 04/17/21 0305  NA 130* 130* 128*  K 3.8 3.1* 3.2*  CL 93* 89* 88*  CO2 30 31 32  GLUCOSE 100* 107* 119*  BUN 15 16 19   CREATININE 0.53* 0.64 0.63  CALCIUM 8.4* 8.4* 8.3*  MG 1.9  --   --    Liver Function Tests Recent Labs    04/17/21 0305  AST 25  ALT 26  ALKPHOS 90  BILITOT 0.6  PROT 5.5*  ALBUMIN 2.1*     No results for input(s): LIPASE, AMYLASE in the last 72 hours. Cardiac Enzymes No results for input(s): CKTOTAL, CKMB, CKMBINDEX, TROPONINI in the last 72  hours.  BNP: BNP (last 3 results) Recent Labs    01/18/21 1629 04/07/21 0500 04/13/21 0333  BNP 607.9* 663.2* 438.2*    ProBNP (last 3 results) No results for input(s): PROBNP in the last 8760 hours.    D-Dimer No results for input(s): DDIMER in the last 72 hours. Hemoglobin A1C No results for input(s): HGBA1C in the last 72 hours.  Fasting Lipid Panel No results for input(s): CHOL, HDL, LDLCALC, TRIG, CHOLHDL, LDLDIRECT in the last 72 hours. Thyroid Function Tests No results for input(s): TSH, T4TOTAL, T3FREE, THYROIDAB in the last 72 hours.  Invalid input(s): FREET3  Other results:   Imaging    ECHOCARDIOGRAM LIMITED  Result Date: 04/16/2021    ECHOCARDIOGRAM LIMITED REPORT   Patient Name:   Brandon [name] Date of Exam: 04/16/2021 Medical Rec #:  [id number]      Height:       72.0 in Accession #:    [id number]     Weight:       180.3 lb Date of Birth:  [birth year]       BSA:          2.039 m Patient Age:    46 years       BP:           0/0 mmHg Patient Gender: M              HR:           94 bpm. Exam Location:  Inpatient Procedure: Limited Echo, Color Doppler and Cardiac Doppler Indications:    LVAD Evaluation  History:        Patient has prior history of Echocardiogram examinations, most                 recent 04/06/2021. Defibrillator; Risk Factors:Hypertension and                 Dyslipidemia. 04/06/21 Implanted 8mm Edwards Inspiris Resilia                 Aortic Valve Bioprosthetic and Heart Mate III LVAD.  Sonographer:    [name] Senior RDCS Referring Phys: [id number] [name] R [name]  Sonographer Comments: Ramp study with Dr. [name] IMPRESSIONS  1. s/p HM-3 LVAD. Left ventricular ejection fraction, by estimation, is 20 to 25%. The left ventricle has severely decreased function. The left ventricle demonstrates global hypokinesis.  2. Right ventricular systolic function is mildly reduced. The right ventricular size is mildly enlarged.  3. There is a 25 mm Edwards Inspiris  Resilia valve present in the aortic position.  4. Moderate pleural effusion in the left lateral region.  5. Ramp study was done with pump speed increased from  5600>5700>5800. LVEDD decreased from 5.0cm to 4.8cm, IVS appears midline, no appreciable change in RV size/function, though RVOT VTI increased from 14cm to 17cm. Difficult to assess for AV opening as  poor leaflet visualization FINDINGS  Left Ventricle: Left ventricular ejection fraction, by estimation, is 20 to 25%. The left ventricle has severely decreased function. The left ventricle demonstrates global hypokinesis. The left ventricular internal cavity size was normal in size. Right Ventricle: The right ventricular size is mildly enlarged. Right ventricular systolic function is mildly reduced. Aortic Valve: The aortic valve has been repaired/replaced. There is a 25 mm Edwards Inspiris Resilia valve present in the aortic position. Pulmonic Valve: The pulmonic valve was not well visualized. Pulmonic valve regurgitation is not visualized. Additional Comments: There is a moderate pleural effusion in the left lateral region. Oswaldo Milian MD Electronically signed by Oswaldo Milian MD Signature Date/Time: 04/16/2021/3:06:06 PM    Final      Medications:     Scheduled Medications:  (feeding supplement) PROSource Plus  30 mL Oral TID PC & HS   sodium chloride   Intravenous Once   amiodarone  200 mg Oral BID   aspirin EC  81 mg Oral Daily   bisacodyl  10 mg Oral Daily   Or   bisacodyl  10 mg Rectal Daily   Chlorhexidine Gluconate Cloth  6 each Topical Daily   digoxin  0.125 mg Oral Daily   docusate sodium  200 mg Oral Daily   doxycycline  100 mg Oral Q12H   ezetimibe  10 mg Oral Daily   feeding supplement  237 mL Oral TID BM   folic acid  1 mg Oral Daily   gabapentin  200 mg Oral TID   insulin aspart  0-15 Units Subcutaneous TID WC   insulin detemir  15 Units Subcutaneous Daily   mouth rinse  15 mL Mouth Rinse BID   midodrine  15  mg Oral TID WC   multivitamin with minerals  1 tablet Oral Daily   pantoprazole  40 mg Oral Daily   polyethylene glycol  17 g Oral Daily   potassium chloride  20 mEq Oral Q4H   sertraline  100 mg Oral Daily   sodium chloride flush  10-40 mL Intracatheter Q12H   sodium chloride flush  10-40 mL Intracatheter Q12H   sodium chloride flush  3 mL Intravenous Q12H   tamsulosin  0.4 mg Oral Daily   torsemide  40 mg Oral Daily   vitamin B-12  100 mcg Oral Daily   Warfarin - Pharmacist Dosing Inpatient   Does not apply q1600    Infusions:  sodium chloride Stopped (04/07/21 0943)   sodium chloride     sodium chloride Stopped (04/09/21 2237)   sodium chloride Stopped (04/14/21 1149)   cefTRIAXone (ROCEPHIN)  IV Stopped (04/16/21 0856)   lactated ringers Stopped (04/13/21 1053)   norepinephrine (LEVOPHED) Adult infusion Stopped (04/15/21 0827)     PRN Medications: sodium chloride, sodium chloride, melatonin, morphine injection, ondansetron (ZOFRAN) IV, oxyCODONE, polyvinyl alcohol, sodium chloride flush, sodium chloride flush, traMADol    Patient Profile   Brandon [name] is a 65 year old with history of smoker, CAD s/p previous MI, HTN, HL, chronic HFeEF, sepsis 11/2020 bacteremia.  Admitted with sepsis. Bld Cx - NGTD    Assessment/Plan   1. Acute on chronic systolic HFr EF -> cardiogenic shock - Echo 05/19/20 EF 20-25% RV mildly HK. moderate AS  Mean gradient 13 AVA 1.2 cm2 DI 0.30 - Persistent  NYHA IV. Admitted with shock - s/p HM-III VAD + bioprosthetic AVR on 11/25 - RV looked good on TEE in OR - Off pressors. CO-OX stable 65.5 %.  - Continue midodrine 15 tid.  - CVP 7. Continue  torsemide 40 daily.  - Continue digoxin 0.125.  - We have been aggressively supplementing K. K 3.2 today . Supp K. Add 12. 5 mg spiro daily  - Maps in 70-80s  - Renal function stable.   2. HM-3 LVAD implant - Plan as above - transfuse to keep hgb > 8.0 - VAD interrogated personally. Parameters  stable. - INR 2.0  Continue ASA 81 for 30 days. Stop ASA 05/14/21  - LDH stable.  -Had ramp ECHO speed increased to 5800. Rare PI events.   3. Acute Hypoxic Respiratory Failure  - Extubated 11/26, stable on 2L [place].    4. 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 CAD  s/p anterior STEMI (12/20). LHC showed chronically occluded RCA (with L>>R collaterals) and thrombotic occlusion of proximal LAD. Underwent PCI of LAD. - No chest pain.  - Continue atorvastatin 80.   6. Iron-deficiency anemia & ABLA - Rec'd Feraheme 11/8 and 11/13  - Keep hgb > 8.0 - Hgb 8.1 today.   7. Hypokalemia/ Hypomagnesemia  - Keep K > 4.0 Mg > 2.0  - supp K  - Add low dose spiro  8. Severe protein-calorie malnutrition - prealbumin 11>15> 13 >6.6 - Continue aggressive nutritional support  9. Paroxysmal Atrial Fibrillation w/ RVR - new, developed 11/10. Maintaining SR/ST    - Off amio drip. Continue amio 200 mg twice a day.  - on warfarin   10. Hyponatremia - sodium 128  - Continue to restrict free water.   11. Sepsis - H/o strep bacteremia 11/2020.  ICD extracted 12/04/20. Barostim was not removed as it is extravascular.  - Received IV antibiotics until 01/15/21. Blood cultures negative 01/30/21..  - 10/26 -Blood CX x2  Negative  - Ct chest concerning for pneumonia. 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 Hemorrhagic Pericarditis - Fibrin clot encasing heart at time of surgery.  Will need to be careful with anticoagulation (go slowly) - PharmD managing .   13. 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 - Ceftriaxone 2 g IV daily x 6 weeks. End Date: May 22, 2021   14. Constipation Given sorbitol, colace, miralax now.   HHPT/HHRN ordered. Home antibiotics.   Transfer to [facility]. Continue VAD educations.   Length of Stay: [place], NP  04/17/2021, 8:32 AM  Advanced Heart Failure Team Pager [phone] (M-F; 7a - 5p)  Please contact [facility] for night-coverage after hours (5p -7a ) and weekends on [email or web address]  Patient seen and examined with the above-signed Advanced Practice  Provider and/or Housestaff. I personally reviewed laboratory data, imaging studies and relevant notes. I independently examined the patient and formulated the important aspects of the plan. I have edited the note to reflect any of my changes or salient points. I have personally discussed the plan with the patient and/or family.  Had ramp echo yesterday. Speed increased to 5800. Denies CP or SOB. Co-ox 65% off inotropes. CVP 7.   Remains constipated   General:  NAD.  HEENT: normal  Neck: supple. JVP 7  Carotids 2+ bilat; no bruits. No lymphadenopathy or thryomegaly appreciated. Cor: LVAD hum.  Lungs: Clear. Abdomen: obese soft, nontender, +distended. No hepatosplenomegaly. No bruits or masses. Hypoactive bowel sounds. Driveline site clean. Anchor in place.  Extremities: no cyanosis, clubbing, rash. Warm no edema  Neuro: alert & oriented x 3. No focal deficits. Moves all 4 without problem   Co-ox improved after Ramp echo. Volume status looks ok. VAD interrogated personally. Parameters stable. Give sorbitol and Relastor for constipation.  Mobilize.   Ok to move out of ICU. INR 2.0. Discussed dosing with PharmD personally. VAD interrogated personally. Parameters stable.  Glori Bickers, MD  1:02 PM

## 2021-04-17 NOTE — Progress Notes (Signed)
CARDIAC REHAB PHASE I   PRE:  Rate/Rhythm: 95 SR  BP:  Sitting: 91/75 (82)      SaO2: 92 4L  MODE:  Ambulation: 130 ft + 19ft   POST:  Rate/Rhythm: 111 ST  BP:  Sitting: 105/74    SaO2: 96 6L   Pt able to demonstrate switching from wall to batteries, needing some reminder to align before pushing together. Pt assisted to BR for BM, than rested in recliner for a few minutes. Pt then ambulated 162ft pushing IV pole and took a seated rest break. Pt then ambulated an additional 139ft using front wheel walker at max height. Pt states walker some easier to use than rollator due to height. Pt returned to recliner and demonstrated switching back to wall power. Encouraged continued ambulation and IS use. Will continue to follow.   [place], RN BSN 04/17/2021 3:25 PM

## 2021-04-17 NOTE — Progress Notes (Signed)
LVAD Coordinator Rounding Note:  Admitted 03/07/21 due to sepsis. Dr. [name] consulted as his HF Cardiologist.   HM III LVAD implanted on 04/06/21 by Dr. Cyndia Bent under Destination Therapy criteria. Not a transplant candidate at this time due to recurrent infections.   Patient sitting up in bed. Reports he had a large bowel movement this morning. RAMP echo completed yesterday with speed increase to 5800 low speed 5500. Pt tolerating speed change well.    Antibiotics per ID for possible AV endocarditis. Per ID note on 04/11/21 he will need 6 weeks IV antibiotics. Currently receiving Ceftriaxone daily. Per ID tissue specimen sent to [facility] for 16 S ribosomal sequencing per PCR- results pending. Has follow up scheduled with ID 04/23/21, but this may need to be rescheduled if pt is still hospitalized. Advanced home health and [facility] set up for medication/PICC maintenance at hospital discharge per case manager.   Plan to transfer to [facility] when bed available. Pt's home equipment ordered from [facility] 04/16/21.  Patient and family education as documented below.   Vital signs: Temp: 96.5 HR: 95 Doppler: 76 Automatic BP: 106/62 (72) O2 Sat:  100% on 2 L/[place] Wt:171.5>190>187.4>187>183.8>184>185>180.3>181.8 lbs     LVAD interrogation reveals:  Speed: 5800 Flow: 5.0 Power: 4.7w PI: 3.6 Alarms: none Events: 2 PI events yesterday afternoon Hematocrit: 26  Fixed speed: 5600 Low speed limit: 5300  Drive Line:  Dressing change performed by pt's sister Patty with VAD coordinator supervision. Existing VAD dressing removed and site care performed using sterile technique. Drive line exit site cleaned with Chlora prep applicators x 2, rinsed with saline, allowed to dry, and gauze dressing with silver strip applied. Exit site unincorporated with suture intact, the velour is fully implanted at exit site. Slight redness around sutures noted. Scant amount fat necrosis drainage, no tenderness, foul  odor or rash noted. Drive line anchor re-applied. Continue Monday, Wednesday and Friday dressing changes per VAD Coordinator, Nurse Davonna Belling, or trained caregiver. Next dressing change due 04/18/21.   Labs:  LDH trend: 309>248>215>206>201>167>165>158  INR trend: 1.6>1.3>1.3>1.4>1.5>1.6>1.8>2.0>2.0  Anticoagulation Plan: -INR Goal: 2 - 2.5 -ASA Dose: 325 mg daily until INR therapeutic  Blood Products:   Intra-op: - 04/06/21>>4 PRBCs, 4 FFP, 1263 cc cell saver  Post-op: - 04/06/21>>1 unit Plts - 04/07/21>>1 unit PRBC  Device: N/A  Arrythmias: 03/22/21 - developed Afib w/ RVR, converted with IV amiodarone  Respiratory: extubated 04/07/21  Nitric Oxide: off 04/07/21  Gtts:   Infection: - 04/11/21 Blood cultures x 2>> no growth x 5 days; final  Patient/Family Teaching:  Encouraged pt and sister to continue reviewing HM III Patient Handbook. They verbalized understanding of same.  Pt's sister [name] performed dressing change today with very frequent verbal cues from VAD coordinator. Will need to be supervised a few more times to ensure sterile/aseptic technique utilized during dressing change Pt's sister and friend [name] practiced sterile glove don/doff. Requires frequent verbal cues. Provided both with several pairs of gloves to practice with at home. Reviewed controller button functions and importance of self test Patty and [name] successfully performed power source changes from wall power to batteries, and back to wall power. Both able to verbalize "one power source at a time." Reviewed battery charger, going home with 8 batteries, need to rotate batteries, need to calibrate batteries, and weekly cleaning maintenance of UBC and battery terminals.  Reviewed red folder with patient log. Discussed importance of documenting weight and VAD parameters daily Had pt, [name], and [place] program VAD clinic and  VAD pager phone numbers into their phones. All 3 successfully paged VAD  coordinator. Explained when to call pager vs clinic.  Discussed importance of black bag, contents of black bag (2 fully charged batteries, 2 clips, back up controller), and stressed importance that he brings this with him everywhere.   Plan/Recommendations:  1. Call VAD Coordinator for any VAD equipment or drive line issues. 2.  Daily dressing changes per VAD Coordinator, Nurse Davonna Belling, or trained caregiver. 3. Will continue education with patient's sister and friend [name] tomorrow afternoon   Emerson Monte RN [place] Coordinator  Office: [phone]  24/7 Pager: [phone]

## 2021-04-18 ENCOUNTER — Inpatient Hospital Stay (HOSPITAL_COMMUNITY): Payer: Medicare Other

## 2021-04-18 DIAGNOSIS — I509 Heart failure, unspecified: Secondary | ICD-10-CM

## 2021-04-18 DIAGNOSIS — I358 Other nonrheumatic aortic valve disorders: Secondary | ICD-10-CM | POA: Diagnosis not present

## 2021-04-18 DIAGNOSIS — Z95811 Presence of heart assist device: Secondary | ICD-10-CM

## 2021-04-18 DIAGNOSIS — J9621 Acute and chronic respiratory failure with hypoxia: Secondary | ICD-10-CM | POA: Diagnosis not present

## 2021-04-18 DIAGNOSIS — R7881 Bacteremia: Secondary | ICD-10-CM | POA: Diagnosis not present

## 2021-04-18 DIAGNOSIS — M456 Ankylosing spondylitis lumbar region: Secondary | ICD-10-CM | POA: Diagnosis not present

## 2021-04-18 LAB — CBC
HCT: 27.9 % — ABNORMAL LOW (ref 39.0–52.0)
Hemoglobin: 8.4 g/dL — ABNORMAL LOW (ref 13.0–17.0)
MCH: 24.6 pg — ABNORMAL LOW (ref 26.0–34.0)
MCHC: 30.1 g/dL (ref 30.0–36.0)
MCV: 81.8 fL (ref 80.0–100.0)
Platelets: 494 10*3/uL — ABNORMAL HIGH (ref 150–400)
RBC: 3.41 MIL/uL — ABNORMAL LOW (ref 4.22–5.81)
RDW: 19.5 % — ABNORMAL HIGH (ref 11.5–15.5)
WBC: 13.5 10*3/uL — ABNORMAL HIGH (ref 4.0–10.5)
nRBC: 0 % (ref 0.0–0.2)

## 2021-04-18 LAB — BASIC METABOLIC PANEL
Anion gap: 9 (ref 5–15)
BUN: 18 mg/dL (ref 8–23)
CO2: 33 mmol/L — ABNORMAL HIGH (ref 22–32)
Calcium: 8.7 mg/dL — ABNORMAL LOW (ref 8.9–10.3)
Chloride: 89 mmol/L — ABNORMAL LOW (ref 98–111)
Creatinine, Ser: 0.7 mg/dL (ref 0.61–1.24)
GFR, Estimated: >60 mL/min (ref >60)
Glucose, Bld: 88 mg/dL (ref 70–99)
Potassium: 3.6 mmol/L (ref 3.5–5.1)
Sodium: 131 mmol/L — ABNORMAL LOW (ref 135–145)

## 2021-04-18 LAB — GLUCOSE, CAPILLARY
Glucose-Capillary: 128 mg/dL — ABNORMAL HIGH (ref 70–99)
Glucose-Capillary: 143 mg/dL — ABNORMAL HIGH (ref 70–99)
Glucose-Capillary: 116 mg/dL — ABNORMAL HIGH (ref 70–99)
Glucose-Capillary: 122 mg/dL — ABNORMAL HIGH (ref 70–99)

## 2021-04-18 LAB — COOXEMETRY PANEL
Carboxyhemoglobin: 1.9 % — ABNORMAL HIGH (ref 0.5–1.5)
Carboxyhemoglobin: 2.1 % — ABNORMAL HIGH (ref 0.5–1.5)
Methemoglobin: 0.7 % (ref 0.0–1.5)
Methemoglobin: 0.7 % (ref 0.0–1.5)
O2 Saturation: 52 %
O2 Saturation: 69.6 %
Total hemoglobin: 8.3 g/dL — ABNORMAL LOW (ref 12.0–16.0)
Total hemoglobin: 7.6 g/dL — ABNORMAL LOW (ref 12.0–16.0)

## 2021-04-18 LAB — MAGNESIUM: Magnesium: 1.8 mg/dL (ref 1.7–2.4)

## 2021-04-18 LAB — LACTATE DEHYDROGENASE: LDH: 161 U/L (ref 98–192)

## 2021-04-18 LAB — PROTIME-INR
INR: 2 — ABNORMAL HIGH (ref 0.8–1.2)
Prothrombin Time: 22.9 seconds — ABNORMAL HIGH (ref 11.4–15.2)

## 2021-04-18 MED ORDER — POTASSIUM CHLORIDE CRYS ER 20 MEQ PO TBCR
40.0000 meq | EXTENDED_RELEASE_TABLET | Freq: Two times a day (BID) | ORAL | Status: AC
  Administered 2021-04-18 (×2): 40 meq via ORAL
  Filled 2021-04-18 (×2): qty 2

## 2021-04-18 MED ORDER — POLYETHYLENE GLYCOL 3350 17 G PO PACK
17.0000 g | PACK | Freq: Every day | ORAL | Status: DC
  Administered 2021-04-24 – 2021-04-26 (×3): 17 g via ORAL
  Filled 2021-04-18 (×7): qty 1

## 2021-04-18 MED ORDER — BISACODYL 10 MG RE SUPP: 10.0000 mg | Freq: Every day | RECTAL | Status: DC | PRN

## 2021-04-18 MED ORDER — BISACODYL 5 MG PO TBEC: 10.0000 mg | DELAYED_RELEASE_TABLET | Freq: Every day | ORAL | Status: DC | PRN

## 2021-04-18 MED ORDER — CEFTRIAXONE IV (FOR PTA / DISCHARGE USE ONLY): 2.0000 g | INTRAVENOUS | 0 refills | Status: AC

## 2021-04-18 MED ORDER — FUROSEMIDE 10 MG/ML IJ SOLN
80.0000 mg | Freq: Once | INTRAMUSCULAR | Status: AC
  Administered 2021-04-18: 80 mg via INTRAVENOUS
  Filled 2021-04-18: qty 8

## 2021-04-18 MED ORDER — MAGNESIUM SULFATE 2 GM/50ML IV SOLN
2.0000 g | Freq: Once | INTRAVENOUS | Status: AC
  Administered 2021-04-18: 2 g via INTRAVENOUS
  Filled 2021-04-18: qty 50

## 2021-04-18 MED ORDER — DOXYCYCLINE HYCLATE 100 MG PO CAPS: 100.0000 mg | ORAL_CAPSULE | Freq: Two times a day (BID) | ORAL | 0 refills | Status: DC

## 2021-04-18 MED ORDER — WARFARIN SODIUM 2.5 MG PO TABS
2.5000 mg | ORAL_TABLET | Freq: Every day | ORAL | Status: DC
  Administered 2021-04-18: 2.5 mg via ORAL
  Filled 2021-04-18: qty 1

## 2021-04-18 MED ORDER — EPLERENONE 25 MG PO TABS
25.0000 mg | ORAL_TABLET | Freq: Every day | ORAL | Status: DC
  Administered 2021-04-19 – 2021-05-09 (×21): 25 mg via ORAL
  Filled 2021-04-18 (×21): qty 1

## 2021-04-18 MED ORDER — SPIRONOLACTONE 25 MG PO TABS: 25.0000 mg | ORAL_TABLET | Freq: Every day | ORAL | Status: DC

## 2021-04-18 NOTE — Progress Notes (Signed)
CARDIAC REHAB PHASE I   PRE:  Rate/Rhythm: 94 SR  BP:  Sitting: 90/81 (86)      SaO2: 95 4L  MODE:  Ambulation: 150 ft +39ft   POST:  Rate/Rhythm: 111 ST  BP:  Sitting: 95/80 (87)    SaO2: 92 4L  Pt agreeable to ambulation. Pt assisted to EOB where he transferred to batteries. Some reminding on where to turn to unlock, and relock cords. Pt also practiced placing batteries in vest, which continues to cause frustration with pt. Pt ambulated ~122ft in hallway pushing IV pole with short standing rest break. Pt took a seated rest break c/o burning in his lungs. Pt then ambulated 101ft with front wheel walker. Pt returned to recliner. Agreeable to sit with vest on to get used to the weight. Pt admits to putting pressure on himself, which in turns increases his anxiety. Provided support and encouragement. Will continue to follow.  [phone] Rufina Falco, RN BSN 04/18/2021 11:52 AM

## 2021-04-18 NOTE — Progress Notes (Signed)
ANTICOAGULATION CONSULT NOTE   Pharmacy Consult for warfarin Indication:  s/p VAD (HM3) , afib, hx DVT  No Active Allergies  Patient Measurements: Height: 6' (182.9 cm) Weight: 80.6 kg (177 lb 11.1 oz) IBW/kg (Calculated) : 77.6   Vital Signs: Temp: 98.2 F (36.8 C) (12/07 0743) Temp Source: Oral (12/07 0743) BP: 96/72 (12/07 0743) Pulse Rate: 96 (12/07 0743)  Labs: Recent Labs    04/16/21 0425 04/17/21 0305 04/18/21 0317  HGB 8.3* 8.1* 8.4*  HCT 27.6* 26.0* 27.9*  PLT 400 435* 494*  LABPROT 22.4* 22.8* 22.9*  INR 2.0* 2.0* 2.0*  CREATININE 0.64 0.63 0.70     Estimated Creatinine Clearance: 98.3 mL/min (by C-G formula based on SCr of 0.7 mg/dL).   Medical History: Past Medical History:  Diagnosis Date   AICD (automatic cardioverter/defibrillator) present 08/31/2019   Ankylosing spondylitis ([place])    Arthritis    BENIGN PROSTATIC HYPERTROPHY 06/07/2008   CHF (congestive heart failure) ([place])    COLONIC POLYPS, HX OF 06/07/2008   Depression    H/O hiatal hernia    Heart failure ([place])    HYPERLIPIDEMIA 06/07/2008   HYPERTENSION 06/07/2008   Myocardial infarction [facility]) 2005   NSTEMI, s/p LAD stent   NEPHROLITHIASIS, HX OF 06/07/2008   STEMI (ST elevation myocardial infarction) ([place]) 04/27/2019    Assessment: 65 yo M s/p HM 3 and AVR on 04/06/21. Prior to surgery/admit patient was on apixiban for a DVT.  INR remains at goal at 2, cbc stable. Nutritional status/prealbumin low. No signs of bleeding.   Can drop ASA 81mg  x 30 days post implant, 12/25.   Goal of Therapy:  Warfarin INR 2.0-2.5 Monitor platelets by anticoagulation protocol: Yes   Plan:  Continue Warfarin 2.5 mg PO daily Daily INR and CBC  Erin Hearing PharmD., BCPS Clinical Pharmacist 04/18/2021 8:29 AM

## 2021-04-18 NOTE — Plan of Care (Signed)
  Problem: Education: Goal: Knowledge of General Education information will improve Description: Including pain rating scale, medication(s)/side effects and non-pharmacologic comfort measures Outcome: Progressing   Problem: Health Behavior/Discharge Planning: Goal: Ability to manage health-related needs will improve Outcome: Progressing   Problem: Clinical Measurements: Goal: Ability to maintain clinical measurements within normal limits will improve Outcome: Progressing Goal: Will remain free from infection Outcome: Progressing Goal: Diagnostic test results will improve Outcome: Progressing Goal: Respiratory complications will improve Outcome: Progressing Goal: Cardiovascular complication will be avoided Outcome: Progressing   Problem: Activity: Goal: Risk for activity intolerance will decrease Outcome: Progressing   Problem: Nutrition: Goal: Adequate nutrition will be maintained Outcome: Progressing   Problem: Coping: Goal: Level of anxiety will decrease Outcome: Progressing   Problem: Elimination: Goal: Will not experience complications related to bowel motility Outcome: Progressing Goal: Will not experience complications related to urinary retention Outcome: Progressing   Problem: Pain Managment: Goal: General experience of comfort will improve Outcome: Progressing   Problem: Safety: Goal: Ability to remain free from injury will improve Outcome: Progressing   Problem: Skin Integrity: Goal: Risk for impaired skin integrity will decrease Outcome: Progressing   Problem: Education: Goal: Ability to demonstrate management of disease process will improve Outcome: Progressing Goal: Ability to verbalize understanding of medication therapies will improve Outcome: Progressing Goal: Individualized Educational Video(s) Outcome: Progressing   Problem: Activity: Goal: Capacity to carry out activities will improve Outcome: Progressing   Problem: Cardiac: Goal:  Ability to achieve and maintain adequate cardiopulmonary perfusion will improve Outcome: Progressing   Problem: Education: Goal: Knowledge of the prescribed therapeutic regimen will improve Outcome: Progressing   Problem: Activity: Goal: Risk for activity intolerance will decrease Outcome: Progressing   Problem: Cardiac: Goal: Ability to maintain an adequate cardiac output will improve Outcome: Progressing   Problem: Coping: Goal: Level of anxiety will decrease Outcome: Progressing   Problem: Fluid Volume: Goal: Risk for excess fluid volume will decrease Outcome: Progressing   Problem: Clinical Measurements: Goal: Ability to maintain clinical measurements within normal limits will improve Outcome: Progressing Goal: Will remain free from infection Outcome: Progressing   Problem: Respiratory: Goal: Will regain and/or maintain adequate ventilation Outcome: Progressing

## 2021-04-18 NOTE — Progress Notes (Signed)
CARDIAC REHAB PHASE I   Offered to walk with pt. Pt requesting ambulation ~11. Will return at that time to ambulate.  Rufina Falco, RN BSN 04/18/2021 10:07 AM

## 2021-04-18 NOTE — TOC CM/SW Note (Signed)
HF TOC CM spoke to Carolynn Sayers, [facility] IV infusion coordinator and [facility] rep, Ramond Marrow, pt scheduled dc within next few days wanted to arrange his Home Health RN, PT and IV abx. States waiting for final IV abx orders, and dc date. Attending and pharmacy updated for OPAT orders. [place], Heart Failure TOC CM [phone]

## 2021-04-18 NOTE — Progress Notes (Signed)
Patient ID: Brandon [name], male   DOB: 02-Nov-1955, 65 y.o.   MRN: [id number] HeartMate 3 Rounding Note  Subjective:    Feels well overall this am and ate all of breakfast. Had some nausea and vomited a little clear fluid about 1 am which he thinks was due to the laxatives. Had a large BM yesterday.  Ambulating well. Still has a little shortness of breath with ambulation.   Co-ox this am 52%, CVP 8  Wt is about 6 lbs over preop.   LVAD INTERROGATION:  HeartMate IIl LVAD:  Flow 5.3 liters/min, speed 5800, power 5, PI 3.4.    Objective:    Vital Signs:   Temp:  [96.5 F (35.8 C)-98.2 F (36.8 C)] 98.2 F (36.8 C) (12/07 0743) Pulse Rate:  [27-101] 96 (12/07 0743) Resp:  [15-31] 16 (12/07 0743) BP: (78-104)/(53-86) 96/72 (12/07 0743) SpO2:  [48 %-100 %] 96 % (12/07 0743) Weight:  [80.6 kg] 80.6 kg (12/07 0307) Last BM Date: 04/17/21 Mean arterial Pressure 80  Intake/Output:   Intake/Output Summary (Last 24 hours) at 04/18/2021 0820 Last data filed at 04/18/2021 0800 Gross per 24 hour  Intake 1323.14 ml  Output 1100 ml  Net 223.14 ml     Physical Exam: General:  Well appearing. No resp difficulty HEENT: normal Cor: Distant heart sounds with LVAD hum present. Lungs: clear Abdomen: soft, nontender, nondistended.Good bowel sounds. Extremities: mild pedal edema Neuro: alert & orientedx3, cranial nerves grossly intact. moves all 4 extremities w/o difficulty. Affect pleasant  Telemetry: sinus 90's  Labs: Basic Metabolic Panel: Recent Labs  Lab 04/12/21 0333 04/12/21 0334 04/13/21 0333 04/14/21 0410 04/15/21 0505 04/16/21 0425 04/17/21 0305 04/18/21 0317  NA 133*   < > 132* 130* 130* 130* 128* 131*  K 3.9   < > 3.1* 4.4 3.8 3.1* 3.2* 3.6  CL 95*  --  86* 92* 93* 89* 88* 89*  CO2 32  --  34* 31 30 31  32 33*  GLUCOSE 89  --  111* 121* 100* 107* 119* 88  BUN 17  --  15 15 15 16 19 18   CREATININE 0.53*  --  0.59* 0.50* 0.53* 0.64 0.63 0.70  CALCIUM 8.5*  --  8.6* 8.6*  8.4* 8.4* 8.3* 8.7*  MG 1.9  --  1.9 1.8 1.9  --   --  1.8  PHOS 3.7  --  4.5  --   --   --   --   --    < > = values in this interval not displayed.    Liver Function Tests: Recent Labs  Lab 04/17/21 0305  AST 25  ALT 26  ALKPHOS 90  BILITOT 0.6  PROT 5.5*  ALBUMIN 2.1*   No results for input(s): LIPASE, AMYLASE in the last 168 hours. No results for input(s): AMMONIA in the last 168 hours.  CBC: Recent Labs  Lab 04/12/21 0333 04/12/21 0334 04/13/21 0333 04/14/21 0315 04/16/21 0425 04/17/21 0305 04/18/21 0317  WBC 11.4*  --  14.2* 14.0* 11.9* 12.2* 13.5*  NEUTROABS 9.4*  --  11.8* 11.9*  --   --   --   HGB 8.0*   < > 9.4* 8.2* 8.3* 8.1* 8.4*  HCT 26.4*   < > 30.0* 26.8* 27.6* 26.0* 27.9*  MCV 81.7  --  81.3 81.7 81.7 81.3 81.8  PLT 232  --  329 333 400 435* 494*   < > = values in this interval not displayed.    INR: Recent  Labs  Lab 04/14/21 0315 04/15/21 0505 04/16/21 0425 04/17/21 0305 04/18/21 0317  INR 2.1* 2.1* 2.0* 2.0* 2.0*    Other results: EKG:   Imaging: ECHOCARDIOGRAM LIMITED  Result Date: 04/16/2021    ECHOCARDIOGRAM LIMITED REPORT   Patient Name:   Brandon [name] Date of Exam: 04/16/2021 Medical Rec #:  [id number]      Height:       72.0 in Accession #:    [id number]     Weight:       180.3 lb Date of Birth:  1956-03-24       BSA:          2.039 m Patient Age:    36 years       BP:           0/0 mmHg Patient Gender: M              HR:           94 bpm. Exam Location:  Inpatient Procedure: Limited Echo, Color Doppler and Cardiac Doppler Indications:    LVAD Evaluation  History:        Patient has prior history of Echocardiogram examinations, most                 recent 04/06/2021. Defibrillator; Risk Factors:Hypertension and                 Dyslipidemia. 04/06/21 Implanted 1mm Edwards Inspiris Resilia                 Aortic Valve Bioprosthetic and Heart Mate III LVAD.  Sonographer:    [name] Senior RDCS Referring Phys: [id number] DANIEL R BENSIMHON   Sonographer Comments: Ramp study with Dr. [name] IMPRESSIONS  1. s/p HM-3 LVAD. Left ventricular ejection fraction, by estimation, is 20 to 25%. The left ventricle has severely decreased function. The left ventricle demonstrates global hypokinesis.  2. Right ventricular systolic function is mildly reduced. The right ventricular size is mildly enlarged.  3. There is a 25 mm Edwards Inspiris Resilia valve present in the aortic position.  4. Moderate pleural effusion in the left lateral region.  5. Ramp study was done with pump speed increased from 5600>5700>5800. LVEDD decreased from 5.0cm to 4.8cm, IVS appears midline, no appreciable change in RV size/function, though RVOT VTI increased from 14cm to 17cm. Difficult to assess for AV opening as  poor leaflet visualization FINDINGS  Left Ventricle: Left ventricular ejection fraction, by estimation, is 20 to 25%. The left ventricle has severely decreased function. The left ventricle demonstrates global hypokinesis. The left ventricular internal cavity size was normal in size. Right Ventricle: The right ventricular size is mildly enlarged. Right ventricular systolic function is mildly reduced. Aortic Valve: The aortic valve has been repaired/replaced. There is a 25 mm Edwards Inspiris Resilia valve present in the aortic position. Pulmonic Valve: The pulmonic valve was not well visualized. Pulmonic valve regurgitation is not visualized. Additional Comments: There is a moderate pleural effusion in the left lateral region. Oswaldo Milian MD Electronically signed by Oswaldo Milian MD Signature Date/Time: 04/16/2021/3:06:06 PM    Final      Medications:     Scheduled Medications:  (feeding supplement) PROSource Plus  30 mL Oral TID PC & HS   sodium chloride   Intravenous Once   amiodarone  200 mg Oral BID   aspirin EC  81 mg Oral Daily   Chlorhexidine Gluconate Cloth  6 each Topical Daily   digoxin  0.125 mg  Oral Daily   docusate sodium  200 mg Oral  Daily   doxycycline  100 mg Oral Q12H   ezetimibe  10 mg Oral Daily   feeding supplement  237 mL Oral TID BM   folic acid  1 mg Oral Daily   gabapentin  200 mg Oral TID   insulin aspart  0-15 Units Subcutaneous TID WC   insulin detemir  15 Units Subcutaneous Daily   mouth rinse  15 mL Mouth Rinse BID   melatonin  5 mg Oral QHS   midodrine  15 mg Oral TID WC   multivitamin with minerals  1 tablet Oral Daily   pantoprazole  40 mg Oral Daily   sertraline  100 mg Oral Daily   sodium chloride flush  10-40 mL Intracatheter Q12H   sodium chloride flush  10-40 mL Intracatheter Q12H   sodium chloride flush  3 mL Intravenous Q12H   spironolactone  12.5 mg Oral Daily   tamsulosin  0.4 mg Oral Daily   torsemide  40 mg Oral Daily   vitamin B-12  100 mcg Oral Daily   Warfarin - Pharmacist Dosing Inpatient   Does not apply q1600    Infusions:  sodium chloride Stopped (04/07/21 0943)   sodium chloride     sodium chloride Stopped (04/09/21 2237)   sodium chloride Stopped (04/14/21 1149)   cefTRIAXone (ROCEPHIN)  IV Stopped (04/17/21 1007)   lactated ringers Stopped (04/13/21 1053)   norepinephrine (LEVOPHED) Adult infusion Stopped (04/17/21 1324)    PRN Medications: sodium chloride, sodium chloride, morphine injection, ondansetron (ZOFRAN) IV, oxyCODONE, polyvinyl alcohol, sodium chloride flush, sodium chloride flush, traMADol   Assessment:    POD 12 HM 3 LVAD and AVR with bioprosthetic valve for acute on chronic systolic heart failure with EF 20-25% and cardiogenic shock requiring milrinone. He had a bicupid AV with moderate AS and AI. Intraop severe vasoplegia requiring multiple vasopressors and methylene blue. RV function looked good. PAF with RVR maintaing sinus on IV amio. 3.   Anklosing spondylitis and UE peripheral neuropathy related to that. 4.   Severe protein-calorie malnutrition with prealbumin 13 despite good caloric intact and protein supplements. 5.   Strep bacteremia 11/2020  with ICD extraction 12/04/20 and IV antibiotics until 01/15/21. BC negative 01/30/21 and 03/07/21 when he presented again with signs of sepsis. CT chest was concerning for pneumonia and he was treated with Maxepime and Lucianne Lei, switched to Augmentin and stopped 03/19/2021. Pathology of valve felt to show signs of acute endocarditis. On Rocephin now per ID. All blood cultures have been negative. 6.   Hyponatremia 7.   Hypokalemia 8.   Iron deficiency anemia and acute postop blood loss anemia. 9.   Noted to have recent hemorrhagic pericarditis with heart encased in recent organized thrombus on Eliquis preop before stopping for surgery. 10.  Large bilateral pleural effusions drained at the time of surgery, related to heart failure.  Plan/Discussion:    He is doing well overall with stable hemodynamics and VAD parameters.  Wt is still about 6 lbs over preop but trending down.  Therapeutic on Coumadin.  Continuing antibiotics per ID for possible AV endocarditis.  Will repeat CXR to reassess pleural effusions and atelectasis.  Continue IS, ambulation.  LVAD teaching.  I reviewed the LVAD parameters from today, and compared the results to the patient's prior recorded data.  No programming changes were made.  The LVAD is functioning within specified parameters.  The patient performs LVAD self-test daily.  LVAD  interrogation was negative for any significant power changes, alarms or PI events/speed drops.  LVAD equipment check completed and is in good working order.  Back-up equipment present.   LVAD education done on emergency procedures and precautions and reviewed exit site care.  Length of Stay: [place] 04/18/2021, 8:20 AM

## 2021-04-18 NOTE — Progress Notes (Addendum)
Patient ID: Brandon [name], male   DOB: 1956/02/02, 65 y.o.   MRN: [id number]     Advanced Heart Failure Rounding Note  PCP-Cardiologist: [name], MD   Subjective:    Admitted with sepsis. Started on cefepime + vanc, now off abx (ID following). Afebrile.  10/29 Co-ox 48%, started on milrinone 0.25.  10/30 IV Antibiotics stopped. Blood cultures no growth for 5 days.  10/31 started on Augmentin for PNA.  11/1 Milrinone off 11/2 Milrinone added back. Midodrine increased to 10 mg TID 11/10 Developed Afib w/ RVR, converted with IV amio 11/14 Milrinone increased to 0.25 mcg. Diuresed with IV lasix  11/25 Underwent HM-3 VAD placement and bioprosthetic AVR. Had persistent hypotension in OR requiring methylene blue and high dose pressors.  11/26 Extubated 11/28 Started on lasix gtt>>later stopped due to low CVP (1) and increasing pressor requirements. Restarted on scheduled BID IV Lasix once pressure stabilized (still volume up)  11/28 Surgical path with evidence of possible "acute AoV endocarditis". ID consulted. Bcx recollected. Tissue sent to [facility] in [place].   11/30 Started on IV ceftriaxone to cover the Streptococcus gordonae that was isolated before, per ID. 11/30 VAD speed increased to 5600 12/1 Diuresed with IV lasix + metolazone. Milrinone was stopped. Amio drip transitioned to amio 200 mg twice a day.  12/5 Ramp Echo speed increased to 5500. Epi stopped.  12/6 Transferred to [place]. Had BM. Started on spiro 12.5 mg daily.   Today CO-OX 52%.    Slept well last night. Had some pain medications. Denies. SOB.    LVAD Interrogation HM 3: Speed: 5800 Flow: 5.3   PI: 4.1 Power: 5  VAD interrogated personally. < 10 PI events.  Parameters stable.     Objective:   Weight Range: 80.6 kg Body mass index is 24.1 kg/m.   Vital Signs:   Temp:  [96.5 F (35.8 C)-98.2 F (36.8 C)] 98.2 F (36.8 C) (12/07 0743) Pulse Rate:  [27-101] 96 (12/07 0743) Resp:  [15-31] 16 (12/07 0743) BP:  (78-100)/(53-81) 96/72 (12/07 0743) SpO2:  [48 %-100 %] 96 % (12/07 0743) Weight:  [80.6 kg] 80.6 kg (12/07 0307) Last BM Date: 04/17/21  Weight change: Filed Weights   04/16/21 0500 04/17/21 0500 04/18/21 0307  Weight: 81.8 kg 82.5 kg 80.6 kg    Intake/Output:   Intake/Output Summary (Last 24 hours) at 04/18/2021 1012 Last data filed at 04/18/2021 0800 Gross per 24 hour  Intake 1323.14 ml  Output 1000 ml  Net 323.14 ml     Physical Exam   CVP 7  Physical Exam: GENERAL: No acute distress. HEENT: normal  NECK: Supple, JVP 7-8  .  2+ bilaterally, no bruits.  No lymphadenopathy or thyromegaly appreciated.   CARDIAC:  Mechanical heart sounds with LVAD hum present.  LUNGS:  Clear to auscultation bilaterally. On  2 liters [place].  ABDOMEN:  Soft, round, nontender, positive bowel sounds x4.     LVAD exit site: well-healed and incorporated.  Dressing dry and intact.  No erythema or drainage.  Stabilization device present and accurately applied.  Driveline dressing is being changed daily per sterile technique. EXTREMITIES:  Warm and dry, no cyanosis, clubbing, rash. R and LLE 1+  edema . RUE PICC  NEUROLOGIC:  Alert and oriented x 3.    No aphasia.  No dysarthria.  Affect pleasant.    .      Telemetry  SR 90s personally checked.     Labs    CBC Recent Labs  04/17/21 0305 04/18/21 0317  WBC 12.2* 13.5*  HGB 8.1* 8.4*  HCT 26.0* 27.9*  MCV 81.3 81.8  PLT 435* 951*   Basic Metabolic Panel Recent Labs    04/17/21 0305 04/18/21 0317  NA 128* 131*  K 3.2* 3.6  CL 88* 89*  CO2 32 33*  GLUCOSE 119* 88  BUN 19 18  CREATININE 0.63 0.70  CALCIUM 8.3* 8.7*  MG  --  1.8   Liver Function Tests Recent Labs    04/17/21 0305  AST 25  ALT 26  ALKPHOS 90  BILITOT 0.6  PROT 5.5*  ALBUMIN 2.1*     No results for input(s): LIPASE, AMYLASE in the last 72 hours. Cardiac Enzymes No results for input(s): CKTOTAL, CKMB, CKMBINDEX, TROPONINI in the last 72  hours.  BNP: BNP (last 3 results) Recent Labs    01/18/21 1629 04/07/21 0500 04/13/21 0333  BNP 607.9* 663.2* 438.2*    ProBNP (last 3 results) No results for input(s): PROBNP in the last 8760 hours.    D-Dimer No results for input(s): DDIMER in the last 72 hours. Hemoglobin A1C No results for input(s): HGBA1C in the last 72 hours.  Fasting Lipid Panel No results for input(s): CHOL, HDL, LDLCALC, TRIG, CHOLHDL, LDLDIRECT in the last 72 hours. Thyroid Function Tests No results for input(s): TSH, T4TOTAL, T3FREE, THYROIDAB in the last 72 hours.  Invalid input(s): FREET3  Other results:   Imaging    No results found.   Medications:     Scheduled Medications:  (feeding supplement) PROSource Plus  30 mL Oral TID PC & HS   sodium chloride   Intravenous Once   amiodarone  200 mg Oral BID   aspirin EC  81 mg Oral Daily   Chlorhexidine Gluconate Cloth  6 each Topical Daily   digoxin  0.125 mg Oral Daily   docusate sodium  200 mg Oral Daily   doxycycline  100 mg Oral Q12H   ezetimibe  10 mg Oral Daily   feeding supplement  237 mL Oral TID BM   folic acid  1 mg Oral Daily   gabapentin  200 mg Oral TID   insulin aspart  0-15 Units Subcutaneous TID WC   insulin detemir  15 Units Subcutaneous Daily   mouth rinse  15 mL Mouth Rinse BID   melatonin  5 mg Oral QHS   midodrine  15 mg Oral TID WC   multivitamin with minerals  1 tablet Oral Daily   pantoprazole  40 mg Oral Daily   [START ON 04/19/2021] polyethylene glycol  17 g Oral Daily   potassium chloride  40 mEq Oral BID   sertraline  100 mg Oral Daily   sodium chloride flush  10-40 mL Intracatheter Q12H   sodium chloride flush  10-40 mL Intracatheter Q12H   sodium chloride flush  3 mL Intravenous Q12H   spironolactone  12.5 mg Oral Daily   tamsulosin  0.4 mg Oral Daily   torsemide  40 mg Oral Daily   vitamin B-12  100 mcg Oral Daily   warfarin  2.5 mg Oral q1600   Warfarin - Pharmacist Dosing Inpatient   Does  not apply q1600    Infusions:  sodium chloride Stopped (04/07/21 0943)   sodium chloride     sodium chloride Stopped (04/09/21 2237)   sodium chloride Stopped (04/14/21 1149)   cefTRIAXone (ROCEPHIN)  IV Stopped (04/17/21 1007)   lactated ringers Stopped (04/13/21 1053)     PRN Medications:  sodium chloride, sodium chloride, [START ON 04/19/2021] bisacodyl **OR** [START ON 04/19/2021] bisacodyl, morphine injection, ondansetron (ZOFRAN) IV, oxyCODONE, polyvinyl alcohol, sodium chloride flush, sodium chloride flush, traMADol    Patient Profile   Brandon [name] is a 65 year old with history of smoker, CAD s/p previous MI, HTN, HL, chronic HFeEF, sepsis 11/2020 bacteremia.  Admitted with sepsis. Bld Cx - NGTD    Assessment/Plan   1. Acute on chronic systolic HFr EF -> cardiogenic shock - Echo 05/19/20 EF 20-25% RV mildly HK. moderate AS  Mean gradient 13 AVA 1.2 cm2 DI 0.30 - Persistent NYHA IV. Admitted with shock - s/p HM-III VAD + bioprosthetic AVR on 11/25 - RV looked good on TEE in OR - Off pressors. CO-OX 52%. Repeat CO-OX 70%  - Continue midodrine 15 tid.  - Continue  torsemide 40 daily.  - Continue digoxin 0.125.  - K up to 3.6 today. Increase spiro to 25 mg dialy  - Renal function stable.    2. HM-3 LVAD implant - Plan as above - transfuse to keep hgb > 8.0 - VAD interrogated personally. Parameters stable. - INR 2.0  Continue ASA 81 for 30 days. Stop ASA 05/14/21  - LDH remains stable at 161.  -Had ramp ECHO speed increased to 5800. Rare PI events.   3. Acute Hypoxic Respiratory Failure  - Extubated 11/26, stable on 2L [place].    4. 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 CAD  s/p anterior STEMI (12/20). LHC showed chronically occluded RCA (with L>>R collaterals) and thrombotic occlusion of proximal LAD. Underwent PCI of LAD. - No chest pain.  - Continue atorvastatin 80.    6. Iron-deficiency anemia & ABLA - Rec'd Feraheme 11/8 and 11/13  - Keep hgb > 8.0 - Hgb 8.4 today.   7. Hypokalemia/ Hypomagnesemia  - Keep K > 4.0 Mg > 2.0  - K up to 3.6  - Mag low. Give 2 grams Mag.   8. Severe protein-calorie malnutrition - prealbumin 11>15> 13 >6.6 - Continue aggressive nutritional support  9. Paroxysmal Atrial Fibrillation w/ RVR - new, developed 11/10. Maintaining SR/ST    - Off amio drip. Continue amio 200 mg twice a day.  - on warfarin. INR 2    10. Hyponatremia - sodium 131  - Continue to restrict free water.   11. Sepsis - H/o strep bacteremia 11/2020.  ICD extracted 12/04/20. Barostim was not removed as it is extravascular.  - Received IV antibiotics until 01/15/21. Blood cultures negative 01/30/21..  - 10/26 -Blood CX x2  Negative  - Ct chest concerning for pneumonia. 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 Hemorrhagic Pericarditis - Fibrin clot encasing heart at time of surgery.  Will need to be careful with anticoagulation (go slowly) - PharmD managing .   13. 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  - Bcx recollected and pending  - Tissue sent to [facility] in [place] for broad 16 S ribosomal sequencing for bacterial pathogens as well as T wet Bhilai Bartonella Brucella  - ID following, recommending  IV ceftriaxone to cover the Streptococcus gordonae  that was isolated before given possible acute AoV endocarditis on surgical path. - Ceftriaxone 2 g IV daily x 6 weeks. End Date: May 22, 2021  14. Constipation Resolved.    HHPT/HHRN ordered. Home antibiotics. Carolynn Sayers aware.    Length of  Stay: [place], NP  04/18/2021, 10:12 AM  Advanced Heart Failure Team Pager [phone] (M-F; 7a - 5p)  Please contact [facility] for night-coverage after hours (5p -7a ) and weekends on [email or web address]  Patient seen and examined with the above-signed Advanced Practice Provider and/or Housestaff. I personally reviewed laboratory data, imaging studies and relevant notes. I independently examined the patient and formulated the important aspects of the plan. I have edited the note to reflect any of my changes or salient points. I have personally discussed the plan with the patient and/or family.  Feels ok today. Speed increased with ramp echo. Co-ox ok. Denies orthopnea or PND. Mild exertional dyspnea. CXR this am worse - suggestive of pulmonary edema. Finally had BM  General:  NAD.  HEENT: normal  Neck: supple. JVP  8-9  Carotids 2+ bilat; no bruits. No lymphadenopathy or thryomegaly appreciated. Cor: LVAD hum.  Lungs: Coarse  Abdomen: obese soft, nontender, non-distended. No hepatosplenomegaly. No bruits or masses. Good bowel sounds. Driveline site clean. Anchor in place.  Extremities: no cyanosis, clubbing, rash. Warm no edema  Neuro: alert & oriented x 3. No focal deficits. Moves all 4 without problem   Continues to progress however CXR worse. Will give IV lasix. I think we can stop amiodarone. VAD interrogated personally. Parameters stable. INR 2.0   Glori Bickers, MD  5:26 PM

## 2021-04-18 NOTE — Progress Notes (Signed)
OT Cancellation Note  Patient Details Name: Brandon [name] MRN: [id number] DOB: [birth year]   Cancelled Treatment:    Reason Eval/Treat Not Completed: Patient at procedure or test/ unavailable. Attempted x 2.  Malka So 04/18/2021, 4:13 PM Nestor Lewandowsky, OTR/L Acute Rehabilitation Services Pager: [phone] Office: [phone]

## 2021-04-18 NOTE — Progress Notes (Signed)
PT Cancellation Note  Patient Details Name: Brandon [name] MRN: [id number] DOB: 03-Sep-1955   Cancelled Treatment:    Reason Eval/Treat Not Completed: Patient currently busy with family meeting for VAD care. Will follow-up for PT treatment as schedule permits.  Mabeline Caras, PT, DPT Acute Rehabilitation Services  Pager [phone] Office [place] 04/18/2021, 1:20 PM

## 2021-04-18 NOTE — Progress Notes (Signed)
Nutrition Follow-up  DOCUMENTATION CODES:   Severe malnutrition in context of chronic illness  INTERVENTION:   - Continue Ensure Enlive po TID, each supplement provides 350 kcal and 20 grams of protein   - Continue ProSource Plus 30 ml po QID, each supplement provides 100 kcal and 15 grams of protein   - Continue MVI with minerals daily  - Continue with liberalized Regular diet  NUTRITION DIAGNOSIS:   Severe Malnutrition related to chronic illness (CHF) as evidenced by severe muscle depletion, severe fat depletion.  Ongoing, being addressed via oral nutrition supplements  GOAL:   Patient will meet greater than or equal to 90% of their needs  Progressing  MONITOR:   PO intake, Supplement acceptance, Labs, Weight trends, Skin, I & O's  REASON FOR ASSESSMENT:   Consult Enteral/tube feeding initiation and management, Assessment of nutrition requirement/status  ASSESSMENT:   65 y.o. male presented to the ED with sudden onset of dyspnea. PMH includes CAD, CHF with ICD, HTN, and on 2L [place] at baseline. Pt admitted with respiratory failure with hypoxia with SIRS.  10/27 - admitted 11/16 - s/p right heart cath 11/25 - s/p HM-3 LVAD placement and bioprosthetic AVR 11/26 - extubated 11/28 - surgical path with possible acute AoV endocarditis  Spoke with pt at bedside. Pt in good spirits this morning, stating that he had a great night of sleep last night. Pt reports that he did have a small volume of liquid/watery emesis last night around midnight but that he has not had any additional emesis since that time. Pt reports that he consumed 100% of his breakfast meal tray with no nausea. Pt has consumed 1 ProSource Plus so far and is working on an [employer] supplement at this time. Pt willing to continue to drink oral nutrition supplements to maximize nutrition.  Pt states that he didn't eat much yesterday due to being constipated which has since resolved. Pt had 2 bowel movements  yesterday.  Admit weight: 76.9 kg in 10/26 Current weight: 80.6 kg  Meal Completion: 50-100%  Medications reviewed and include: ProSource Plus 30 ml QID, colace, Ensure Enlive TID, folic acid, SSI, levemir 15 units daily, melatonin, MVI with minerals daily, protonix, miralax, klor-con 40 Eq x 2, spironolactone, torsemide, vitamin B-12 100 mcg daily, warfarin, IV abx, IV magnesium sulfate 2 grams once  Labs reviewed: sodium 131, magnesium 1.8, prealbumin 6.6, hemoglobin 8.4, WBC 13.5 CBG's: 90-114 x 24 hours  UOP: 1200 ml x 24 hours I/O's: -34.5 L since admit  Diet Order:   Diet Order             Diet regular Room service appropriate? Yes; Fluid consistency: Thin  Diet effective now                   EDUCATION NEEDS:   Education needs have been addressed  Skin:  Skin Assessment: Skin Integrity Issues: Stage I: coccyx Incisions: chest, L abdomen  Last BM:  04/17/21 large type 6  Height:   Ht Readings from Last 1 Encounters:  03/07/21 6' (1.829 m)    Weight:   Wt Readings from Last 1 Encounters:  04/18/21 80.6 kg    Ideal Body Weight:  80.9 kg  BMI:  Body mass index is 24.1 kg/m.  Estimated Nutritional Needs:   Kcal:  2300-2500  Protein:  115-130 grams  Fluid:  >/= 2 L    Gustavus Bryant, MS, RD, LDN Inpatient Clinical Dietitian Please see AMiON for contact information.

## 2021-04-18 NOTE — Progress Notes (Signed)
Patient ID: [name], male   DOB: 1956-02-11, 65 y.o.   MRN: [id number]    Progress Note from the Palliative Medicine Team at [facility]   Patient Name: Brandon [name]        Date: 04/18/2021 DOB: [birth year]  Age: 65 y.o. MRN#: [id number] Attending Physician: Gaye Pollack, MD Primary Care Physician: [name], MD Admit Date: 03/07/2021   Medical records reviewed   65 y.o. male   admitted on 03/07/2021 with past medical history significant of CAD with previous MI, chronic systolic CHF/ICM w/ [place] ICD that was extracted 7/22, moderate aortic stenosis, HTN, HLD, hx of recent right UE DVT who presents with sudden onset dyspnea.   04-06-21  HM-3 VAD placement and bioprosthetic AVR.    This NP visited patient at the bedside as a follow up for palliative medicine needs and emotional support.   Patient is progressing well,  is ambulating in the halls, is positive and optimistic and looking to the future.  Emotional support offered.  Education offered on the importance of continued conversation with his family and the  medical providers regarding overall plan of care and treatment options,  ensuring decisions are within the context of the patients values and GOCs.  Questions and concerns addressed   PMT will continue to support holistically  Total time 15 minutes  Greater than 50% of the time was spent in counseling and coordination of care  Wadie Lessen NP  Palliative Medicine Team Team Phone # [phone] Pager [phone]

## 2021-04-18 NOTE — Progress Notes (Signed)
VAD Discharge Teaching Note:  Discharge VAD teaching completed with patient, his sister [name], and his friend [name].   The home inspection checklist has been reviewed and no unsafe conditions have been identified. Family reports that there are at least two dedicated grounded, 3-prong outlets with clearly labeled circuit breaker has been established in the bedroom for power module and Charity fundraiser. Plan is for patient to return to his home, and his caregivers will come and stay with him there.   Both patient and caregivers have been trained on the following:  1. HM III LVAD overview of system operations  2. Overview of major lifestyle accommodations and cautions   3. Overview of system components (features and functions) 4. Changing power sources 5. Overview of alerts and alarms 6. How to identify and manage an emergency including when pump is running and when pump has stopped  7. Changing system controller 8. Maintain emergency contact list and medications  The patient and caregiver(s) have successfully demonstrated:  1. Changing power source (from batteries to mobile power unit, mobile power unit to batteries, and replacing batteries) 2. Perform system controller self test  3. Check and charge batteries  4. Change system controller 5. Paged VAD pager and programmed number in phones  A daily flow sheet with patient  weight, temperature,  flow, speed, power, and PI, along with daily self checks on system controller and power module have been performed by patient and caregiver(s) during hospitalization and will also be done daily at home.   The caregiver(s) has been trained on percutaneous lead exit site care, care of the driveline and dressing changes. They have performed dressing changes during patient's hospitalization under my supervision with the support of the nursing staff. The importance of lead immobilization has been stressed to patient and caregiver(s) using the attachment  device. The caregiver has successfully demonstrated the following: 1. Cleansing site with sterile technique 2. Dressing care and maintenance  3. Immobilizing driveline  The following routine activities and maintenance have been reviewed with patient and caregiver(s) and both verbalize understanding:  1. Stressed importance of never disconnecting power from both controller power leads at the same time, and never disconnecting both batteries at the same time, or the pump will stop 2. Plug the mobile power unit (MPU) and the universal battery charger (UBC) into properly grounded (3 prong) outlets dedicated to PM use. Do NOT use adapter (cheater plug) for ungrounded outlets or multiple portable socket outlets (power strips) 3. Do not connect the PM or MPU to an outlet controlled by wall switch or the device may not work 4. Transfer from MPU to batteries during [facility] mains power failure. The PM has internal backup battery that will power the pump while you transfer to batteries 5. Keep a backup system controller, charged batteries, battery clips, and flashlight near you during sleep in case of electrical power outage 6. Clean battery, battery clip, and universal battery charger contacts weekly 7. Visually inspect percutaneous lead daily 8. Check cables and connectors when changing power source  9. Rotate batteries; keep all eight batteries charged 10. Always have backup system controller, battery clips, fully charged batteries, and spare fully charged batteries when traveling 11. Re-calibrate batteries every 70 uses; monitor battery life of 36 months or 360 uses; replace batteries at end of battery life   Identified the following changes in activities of daily living with pump:  1. No driving for at least six weeks and then only if doctor gives permission to do  so 2. No tub baths while pump implanted, and shower only if doctor gives permission 3. No swimming or submersion in water while implanted  with pump 4. Keep all VAD equipment away from water or moisture 5. Keep all VAD connections clean and dry 6. No contact sports or engage in jumping activities 7. Avoid strong static electricity (touching TV/computer screens, vacuuming) 8. Never have an MRI while implanted with the pump 9. Never leave or store batteries in extremely hot or cold places (such as   trunk of your car), or the battery life will be shortened 10. Call the doctor or hospital contact person if any change in how the pump sounds, feels, or works 11. Plan to sleep only when connected to the power module. 12. Keep a backup system controller, charged batteries, battery clips, and flashlight near you during sleep in case of electrical power outage 13. Do not sleep on your stomach 14. Talk with doctor before any long distance travel plans 15. Patient will need antibiotics prior to any dental procedure; instructed to contact VAD coordinator before any dental procedures (including routine cleaning)   Discharge binder given to patient and include the following: 1. List of emergency contacts 2. Wallet card 3. HM III Luggage tags 4. HM III Alarms for Patients and their Caregivers 5. HM III Patient Handbook 6. HM III Patient Education Program DVD 7. Daily diary sheets 8. Warfarin teaching sheets 9. Nosebleed teaching sheets 10. Medications you may and may not take with CHF list  Discharge equipment includes:  1. Two system controllers 2. One mobile power unit with attached 21 ' patient cable 3. One universal Charity fundraiser (UBC) 4. Eight fully charged batteries  5. Four battery clips 6. One travel case 7. One holster vest 8. Wearable accessory package 9. Daily dressing kits and anchors  Following notification process completed with: [facility] [name]  Dr Grier Mitts,  PCP   Discussed frequency and importance of INR checks; emphasized importance of maintaining INR goal to prevent  clotting and or bleeding issues with pump. Able to answer questions and asked good questions pertaining to warfarin and diet/lifestyle changes necessary to be successful and safe.  Patient will have INR managed by heart failure clinic; current INR goal is 2.0 - 2.5.  He has expressed interest in obtaining home INR monitor once appropriate. Will work with him in the future for this equipment.    The patient has completed a proficiency test for the HM III and all questions have been answered. The pt and family have been instructed to call if any questions, problems, or concerns arise. Pt and caregivers successfully paged VAD coordinator using [place] pager emergency number and have been instructed to use this number only for emergencies.  Patient and caregiver(s) asked appropriate questions, had good interaction with VAD coordinator, and verbalized understanding of above instructions.   Total elapsed time: 6 hours  Emerson Monte RN [place] Coordinator  Office: [phone]  24/7 Pager: [phone]

## 2021-04-18 NOTE — Progress Notes (Signed)
LVAD Coordinator Rounding Note:  Admitted 03/07/21 due to sepsis. Dr. [name] consulted as his HF Cardiologist.   HM III LVAD implanted on 04/06/21 by Dr. Cyndia Bent under Destination Therapy criteria. Not a transplant candidate at this time due to recurrent infections.   Patient transferring back to bed from recliner upon my arrival. Reports he walked in the hallway earlier this morning; some shortness of breath noted with ambulation. Denies shortness of breath currently.   Antibiotics per ID for possible AV endocarditis. Per ID note on 04/11/21 he will need 6 weeks IV antibiotics. Currently receiving Ceftriaxone daily. Per ID tissue specimen sent to [facility] for 16 S ribosomal sequencing per PCR- results pending. Has follow up scheduled with ID 04/23/21, but this may need to be rescheduled if pt is still hospitalized. [facility] and [facility] set up for medication/PICC maintenance at hospital discharge per case manager. Spoke with Carolynn Sayers today regarding plan for discharge.   Patient's home equipment arrived. Patient and family VAD discharge education completed today. See separate note for documentation.   Vital signs: Temp: 98.1 HR: 98 Doppler: 80 Automatic BP: 95/80 (87) O2 Sat:  92% on 2 L/[place] Wt:171.5>190>187.4>187>183.8>184>185>180.3>181.8>177.6 lbs     LVAD interrogation reveals:  Speed: 5800 Flow: 5.2 Power: 4.6w PI: 3.9 Alarms: none Events: 6 PI events  Hematocrit: 28  Fixed speed: 5600 Low speed limit: 5300  Drive Line:  Dressing change performed by pt's friend [name] with VAD coordinator supervision. Existing VAD dressing removed and site care performed using sterile technique. Drive line exit site cleaned with Chlora prep applicators x 2, rinsed with saline, allowed to dry, and gauze dressing with silver strip applied. Exit site unincorporated with suture intact, the velour is fully implanted at exit site. Slight redness around sutures noted. Scant  amount fat necrosis drainage, no tenderness, foul odor or rash noted. Drive line anchor re-applied. Continue Monday, Wednesday and Friday dressing changes per VAD Coordinator, Nurse Davonna Belling, or trained caregiver. Next dressing change due 04/20/21. Plan for VAD coordinator to perform dressing change with [name] again tomorrow.    Labs:  LDH trend: 309>248>215>206>201>167>165>158>161  INR trend: 1.6>1.3>1.3>1.4>1.5>1.6>1.8>2.0>2.0>2.0  Anticoagulation Plan: -INR Goal: 2 - 2.5 -ASA Dose: 81 mg daily  Blood Products:   Intra-op: - 04/06/21>>4 PRBCs, 4 FFP, 1263 cc cell saver  Post-op: - 04/06/21>>1 unit Plts - 04/07/21>>1 unit PRBC  Device: N/A  Arrythmias: 03/22/21 - developed Afib w/ RVR, converted with IV amiodarone  Respiratory: extubated 04/07/21  Nitric Oxide: off 04/07/21  Gtts:   Infection: - 04/11/21 Blood cultures x 2>> no growth x 5 days; final  Patient/Family Teaching:  VAD discharge teaching completed with patient, his sister [name], and his friend [name]. See separate note for documentation.  Plan for Marsha to change drive line dressing again tomorrow with VAD coordinator supervision.   Plan/Recommendations:  1. Call VAD Coordinator for any VAD equipment or drive line issues. 2.  Daily dressing changes per VAD Coordinator, Nurse Davonna Belling, or trained caregiver. 3. VAD discharge teaching completed with patient and family.    Emerson Monte RN [place] Coordinator  Office: [phone]  24/7 Pager: [phone]

## 2021-04-19 ENCOUNTER — Inpatient Hospital Stay (HOSPITAL_COMMUNITY): Payer: Medicare Other

## 2021-04-19 DIAGNOSIS — J9621 Acute and chronic respiratory failure with hypoxia: Secondary | ICD-10-CM | POA: Diagnosis not present

## 2021-04-19 HISTORY — PX: IR THORACENTESIS ASP PLEURAL SPACE W/IMG GUIDE: IMG5380

## 2021-04-19 LAB — BASIC METABOLIC PANEL
Anion gap: 10 (ref 5–15)
BUN: 17 mg/dL (ref 8–23)
CO2: 34 mmol/L — ABNORMAL HIGH (ref 22–32)
Calcium: 8.6 mg/dL — ABNORMAL LOW (ref 8.9–10.3)
Chloride: 89 mmol/L — ABNORMAL LOW (ref 98–111)
Creatinine, Ser: 0.72 mg/dL (ref 0.61–1.24)
GFR, Estimated: >60 mL/min (ref >60)
Glucose, Bld: 99 mg/dL (ref 70–99)
Potassium: 3.5 mmol/L (ref 3.5–5.1)
Sodium: 133 mmol/L — ABNORMAL LOW (ref 135–145)

## 2021-04-19 LAB — POCT I-STAT 7, (LYTES, BLD GAS, ICA,H+H)
Acid-Base Excess: >30 mmol/L — ABNORMAL HIGH (ref 0.0–2.0)
Bicarbonate: 74.5 mmol/L — ABNORMAL HIGH (ref 20.0–28.0)
Calcium, Ion: 0.77 mmol/L — CL (ref 1.15–1.40)
HCT: 23 % — ABNORMAL LOW (ref 39.0–52.0)
Hemoglobin: 7.8 g/dL — ABNORMAL LOW (ref 13.0–17.0)
O2 Saturation: 100 %
Potassium: 2.2 mmol/L — CL (ref 3.5–5.1)
Sodium: 163 mmol/L (ref 135–145)
TCO2: >50 mmol/L — ABNORMAL HIGH (ref 22–32)
pCO2 arterial: 68.1 mmHg (ref 32.0–48.0)
pH, Arterial: 7.647 (ref 7.350–7.450)
pO2, Arterial: 436 mmHg — ABNORMAL HIGH (ref 83.0–108.0)

## 2021-04-19 LAB — CBC
HCT: 27.9 % — ABNORMAL LOW (ref 39.0–52.0)
Hemoglobin: 8.2 g/dL — ABNORMAL LOW (ref 13.0–17.0)
MCH: 23.9 pg — ABNORMAL LOW (ref 26.0–34.0)
MCHC: 29.4 g/dL — ABNORMAL LOW (ref 30.0–36.0)
MCV: 81.3 fL (ref 80.0–100.0)
Platelets: 506 10*3/uL — ABNORMAL HIGH (ref 150–400)
RBC: 3.43 MIL/uL — ABNORMAL LOW (ref 4.22–5.81)
RDW: 19.4 % — ABNORMAL HIGH (ref 11.5–15.5)
WBC: 12.2 10*3/uL — ABNORMAL HIGH (ref 4.0–10.5)
nRBC: 0 % (ref 0.0–0.2)

## 2021-04-19 LAB — GRAM STAIN: Gram Stain: NONE SEEN

## 2021-04-19 LAB — GLUCOSE, CAPILLARY
Glucose-Capillary: 131 mg/dL — ABNORMAL HIGH (ref 70–99)
Glucose-Capillary: 117 mg/dL — ABNORMAL HIGH (ref 70–99)
Glucose-Capillary: 90 mg/dL (ref 70–99)
Glucose-Capillary: 101 mg/dL — ABNORMAL HIGH (ref 70–99)

## 2021-04-19 LAB — COOXEMETRY PANEL
Carboxyhemoglobin: 1.8 % — ABNORMAL HIGH (ref 0.5–1.5)
Carboxyhemoglobin: 1.6 % — ABNORMAL HIGH (ref 0.5–1.5)
Methemoglobin: 0.6 % (ref 0.0–1.5)
Methemoglobin: 0.9 % (ref 0.0–1.5)
O2 Saturation: 52.3 %
O2 Saturation: 41.2 %
Total hemoglobin: 8.2 g/dL — ABNORMAL LOW (ref 12.0–16.0)
Total hemoglobin: 7.6 g/dL — ABNORMAL LOW (ref 12.0–16.0)

## 2021-04-19 LAB — PROTIME-INR
INR: 1.8 — ABNORMAL HIGH (ref 0.8–1.2)
Prothrombin Time: 21.2 seconds — ABNORMAL HIGH (ref 11.4–15.2)

## 2021-04-19 LAB — LACTATE DEHYDROGENASE: LDH: 193 U/L — ABNORMAL HIGH (ref 98–192)

## 2021-04-19 MED ORDER — WARFARIN SODIUM 3 MG PO TABS: 3.0000 mg | ORAL_TABLET | Freq: Once | ORAL | Status: DC

## 2021-04-19 MED ORDER — POTASSIUM CHLORIDE CRYS ER 20 MEQ PO TBCR
40.0000 meq | EXTENDED_RELEASE_TABLET | ORAL | Status: AC
  Administered 2021-04-19 (×2): 40 meq via ORAL
  Filled 2021-04-19 (×2): qty 2

## 2021-04-19 MED ORDER — WARFARIN SODIUM 1 MG PO TABS
1.0000 mg | ORAL_TABLET | Freq: Once | ORAL | Status: AC
  Administered 2021-04-19: 1 mg via ORAL
  Filled 2021-04-19: qty 1

## 2021-04-19 MED ORDER — LIDOCAINE HCL 1 % IJ SOLN
INTRAMUSCULAR | Status: AC
  Filled 2021-04-19: qty 20

## 2021-04-19 MED ORDER — LIDOCAINE HCL 1 % IJ SOLN
INTRAMUSCULAR | Status: DC | PRN
  Administered 2021-04-19: 20 mL

## 2021-04-19 NOTE — Progress Notes (Signed)
CARDIAC REHAB PHASE I   Offered to walk with pt. Pt states pain from procedure today, not feeling the best. Mobility Team with plans to return later and offer ambulation. OT at bedside. Provided support and encouragement. Will continue to follow.  [place], RN BSN 04/19/2021 3:09 PM

## 2021-04-19 NOTE — Progress Notes (Signed)
LVAD Coordinator Rounding Note:  Admitted 03/07/21 due to sepsis. Dr. Haroldine [name] consulted as his HF Cardiologist.   HM III LVAD implanted on 04/06/21 by Dr. Cyndia [name] under Destination Therapy criteria. Not a transplant candidate at this time due to recurrent infections.   Pt up in the chair on my arrival. Assisted pt back to bed for caregiver to change dressing. See note below for dressing change and pic.  Pt states he has been very SOB in the last 24 hrs. Dr. Cyndia [name] has ordered thoracentesis on right and left lung. VAD coordinator accompanied pt to IR for thoracentesis this morning pt tolerated the procedure well. 1.7L drained from left lung.  Antibiotics per ID for possible AV endocarditis. Per ID note on 04/11/21 he will need 6 weeks IV antibiotics. Currently receiving Ceftriaxone daily. Per ID tissue specimen sent to [facility] for 16 S ribosomal sequencing per PCR- results pending. Has follow up scheduled with ID 04/23/21, but this may need to be rescheduled if pt is still hospitalized. [facility] and [facility] set up for medication/PICC maintenance at hospital discharge per case manager.   Patient's home equipment arrived. Patient and family VAD discharge education complete.   Vital signs: Temp: 98.3 HR: 92 Doppler: 90 Automatic BP: 102/89 (95) O2 Sat:  92% on 2 L/[name]     LVAD interrogation reveals:  Speed: 5800 Flow: 4.9 Power: 4.8w PI: 3.3  Alarms: none Events: 2 PI events  Hematocrit: 28  Fixed speed: 5600 Low speed limit: 5300  Drive Line:  Dressing change performed by pt's friend Brandon [name] with VAD coordinator supervision. Existing VAD dressing removed and site care performed using sterile technique. Drive line exit site cleaned with Chlora prep applicators x 2, rinsed with saline, allowed to dry, and gauze dressing with silver strip applied. Exit site unincorporated with suture intact, the  velour is fully implanted at exit site. Slight redness around sutures noted. No drainage, no tenderness, foul odor or rash noted. Drive line anchor re-applied. Advance to twice a week dressing changes on Monday and Thursday per VAD Coordinator, Nurse Brandon [name], or Brandon [name]. Next dressing change due 04/23/21.     Labs:  LDH trend: 309>248>215>206>201>167>165>158>161>193  INR trend: 1.6>1.3>1.3>1.4>1.5>1.6>1.8>2.0>2.0>2.0>1.8  Anticoagulation Plan: -INR Goal: 2 - 2.5 -ASA Dose: 81 mg daily  Blood Products:   Intra-op: - 04/06/21>>4 PRBCs, 4 FFP, 1263 cc cell saver  Post-op: - 04/06/21>>1 unit Plts - 04/07/21>>1 unit PRBC  Device: N/A  Arrythmias: 03/22/21 - developed Afib w/ RVR, converted with IV amiodarone  Respiratory: extubated 04/07/21  Nitric Oxide: off 04/07/21  Gtts:   Infection: - 04/11/21 Blood cultures x 2>> no growth x 5 days; final  Patient/Family Teaching:  VAD discharge teaching completed with patient, his sister Brandon [name], and his friend Brandon [name]. See separate note for documentation.  Brandon [name] able to do dressing changes with bedside nurse.  Plan/Recommendations:  1. Call VAD Coordinator for any VAD equipment or drive line issues. 2.  Daily dressing changes per VAD Coordinator, Nurse Brandon [name], or trained caregiver. 3. VAD discharge teaching completed with patient and family.    Brandon Rockers RN [place] Coordinator  Office: [phone]  24/7 Pager: [phone]

## 2021-04-19 NOTE — Plan of Care (Signed)
  Problem: Education: Goal: Knowledge of General Education information will improve Description: Including pain rating scale, medication(s)/side effects and non-pharmacologic comfort measures Outcome: Progressing   Problem: Health Behavior/Discharge Planning: Goal: Ability to manage health-related needs will improve Outcome: Progressing   Problem: Clinical Measurements: Goal: Ability to maintain clinical measurements within normal limits will improve Outcome: Progressing Goal: Will remain free from infection Outcome: Progressing Goal: Diagnostic test results will improve Outcome: Progressing Goal: Respiratory complications will improve Outcome: Progressing Goal: Cardiovascular complication will be avoided Outcome: Progressing   Problem: Activity: Goal: Risk for activity intolerance will decrease Outcome: Progressing   Problem: Nutrition: Goal: Adequate nutrition will be maintained Outcome: Progressing   Problem: Coping: Goal: Level of anxiety will decrease Outcome: Progressing   Problem: Elimination: Goal: Will not experience complications related to bowel motility Outcome: Progressing Goal: Will not experience complications related to urinary retention Outcome: Progressing   Problem: Pain Managment: Goal: General experience of comfort will improve Outcome: Progressing   Problem: Safety: Goal: Ability to remain free from injury will improve Outcome: Progressing   Problem: Skin Integrity: Goal: Risk for impaired skin integrity will decrease Outcome: Progressing   Problem: Education: Goal: Ability to demonstrate management of disease process will improve Outcome: Progressing Goal: Ability to verbalize understanding of medication therapies will improve Outcome: Progressing Goal: Individualized Educational Video(s) Outcome: Progressing   Problem: Activity: Goal: Capacity to carry out activities will improve Outcome: Progressing   Problem: Cardiac: Goal:  Ability to achieve and maintain adequate cardiopulmonary perfusion will improve Outcome: Progressing   Problem: Education: Goal: Knowledge of the prescribed therapeutic regimen will improve Outcome: Progressing   Problem: Activity: Goal: Risk for activity intolerance will decrease Outcome: Progressing   Problem: Cardiac: Goal: Ability to maintain an adequate cardiac output will improve Outcome: Progressing   Problem: Coping: Goal: Level of anxiety will decrease Outcome: Progressing   Problem: Fluid Volume: Goal: Risk for excess fluid volume will decrease Outcome: Progressing   Problem: Clinical Measurements: Goal: Ability to maintain clinical measurements within normal limits will improve Outcome: Progressing Goal: Will remain free from infection Outcome: Progressing   Problem: Respiratory: Goal: Will regain and/or maintain adequate ventilation Outcome: Progressing

## 2021-04-19 NOTE — Progress Notes (Signed)
Occupational Therapy Treatment Patient Details Name: Brandon [name] MRN: [id number] DOB: August 25, 1955 Today's Date: 04/19/2021   History of present illness Pt is a 65 y.o. male admitted 03/07/21 with acute on chronic hypoxemic respiratory failure, cardiogenic shock. CT scan which shows bronchopneumonia and no indication of ILD. Prolonged admission undergoing workup for VAD. S/p LVAD placement 11/25. Thoracentesis 12/8. Other PMH includes CHF, ankylosing spondylitis, bacteremia and  possible endocarditis s/p ICD removal, HTN, depression, arthritis.   OT comments  Pt declining bathing, educated to have caregiver place 3 in 1 at sink for sponge bathing at home. Pt demonstrated ability to don and doff socks with set up using figure 4 method. Completed seated grooming. Stood x 1 for pressure relief from chair with supervision. Pt fatigued and with pain following earlier thoracentesis. Will not discharge until early next week.    Recommendations for follow up therapy are one component of a multi-disciplinary discharge planning process, led by the attending physician.  Recommendations may be updated based on patient status, additional functional criteria and insurance authorization.    Follow Up Recommendations  No OT follow up    Assistance Recommended at Discharge Intermittent Supervision/Assistance  Equipment Recommendations  BSC/3in1;Tub/shower seat    Recommendations for Other Services      Precautions / Restrictions Precautions Precautions: Fall;Sternal;Other (comment) Precaution Comments: LVAD       Mobility Bed Mobility               General bed mobility comments: in chair    Transfers Overall transfer level: Needs assistance Equipment used: None Transfers: Sit to/from Stand Sit to Stand: Supervision           General transfer comment: from chair for pressure relief     Balance Overall balance assessment: Needs assistance Sitting-balance support: Feet supported;No  upper extremity supported Sitting balance-Leahy Scale: Good       Standing balance-Leahy Scale: Fair                             ADL either performed or assessed with clinical judgement   ADL Overall ADL's : Needs assistance/impaired     Grooming: Wash/dry hands;Wash/dry face;Oral care;Sitting;Set up Grooming Details (indicate cue type and reason): declined attempt at sink             Lower Body Dressing: Set up;Sitting/lateral leans Lower Body Dressing Details (indicate cue type and reason): socks using figure 4                    Extremity/Trunk Assessment              Vision       Perception     Praxis      Cognition Arousal/Alertness: Awake/alert Behavior During Therapy: WFL for tasks assessed/performed   Area of Impairment: Problem solving                             Problem Solving: Requires verbal cues General Comments: cues to generalize sternal precautions          Exercises     Shoulder Instructions       General Comments      Pertinent Vitals/ Pain       Pain Assessment: Faces Faces Pain Scale: Hurts little more Pain Location: back Pain Descriptors / Indicators: Sharp Pain Intervention(s): Repositioned;Premedicated before session  Home Living  Prior Functioning/Environment              Frequency  Min 2X/week        Progress Toward Goals  OT Goals(current goals can now be found in the care plan section)  Progress towards OT goals: Progressing toward goals  Acute Rehab OT Goals OT Goal Formulation: With patient Time For Goal Achievement: 04/22/21 Potential to Achieve Goals: Good  Plan Discharge plan remains appropriate    Co-evaluation                 AM-PAC OT "6 Clicks" Daily Activity     Outcome Measure   Help from another person eating meals?: None Help from another person taking care of personal grooming?: A  Little Help from another person toileting, which includes using toliet, bedpan, or urinal?: A Little Help from another person bathing (including washing, rinsing, drying)?: A Little Help from another person to put on and taking off regular upper body clothing?: A Little Help from another person to put on and taking off regular lower body clothing?: A Little 6 Click Score: 19    End of Session Equipment Utilized During Treatment: Oxygen  OT Visit Diagnosis: Unsteadiness on feet (R26.81);Muscle weakness (generalized) (M62.81)   Activity Tolerance Patient limited by pain;Patient limited by fatigue   Patient Left in chair;with call bell/phone within reach   Nurse Communication          Time: 0272-5366 OT Time Calculation (min): 29 min  Charges: OT General Charges $OT Visit: 1 Visit OT Treatments $Self Care/Home Management : 23-37 mins  Nestor Lewandowsky, OTR/L Acute Rehabilitation Services Pager: [phone] Office: [phone]   Malka So 04/19/2021, 3:15 PM

## 2021-04-19 NOTE — Progress Notes (Signed)
      INFECTIOUS DISEASE ATTENDING ADDENDUM:   Date: 04/19/2021  Patient name: Brandon [name]  Medical record number: [id number]  Date of birth: September 03, 1955   [facility] and [facility] are negative so far the only test still pending is a perforator T whippelei PCR which will be negative  Here are the [place] 04/19/2021, 9:13 AM

## 2021-04-19 NOTE — Procedures (Signed)
PROCEDURE SUMMARY:  Successful US guided left thoracentesis. Yielded 1.7 L of light red fluid. Pt tolerated procedure well. No immediate complications.  Specimen sent for labs. CXR ordered; no post-procedure pneumothorax identified   EBL < 2 mL  Theresa Duty, NP 04/19/2021 11:36 AM

## 2021-04-19 NOTE — Progress Notes (Signed)
Mobility Specialist: Progress Note   04/19/21 1515  Mobility  Activity Transferred:  Bed to chair  Level of Assistance Minimal assist, patient does 75% or more  Assistive Device None  Distance Ambulated (ft) 2 ft  Mobility Out of bed to chair with meals  Mobility Response Tolerated well  Mobility performed by Mobility specialist  Bed Position Chair  $Mobility charge 1 Mobility   Pt differed ambulation d/t pain he rated 8-9/10 but was agreeable to transfer to the chair. Pt required minA to stand and contact guard during transfer. Pt is in the recliner with call bell at his side. Will f/u as time allows.   [facility] Enmanuel Zufall Mobility Specialist Mobility Specialist Phone #1: [phone] Mobility Specialist Phone #2: [phone]

## 2021-04-19 NOTE — Progress Notes (Addendum)
ANTICOAGULATION CONSULT NOTE   Pharmacy Consult for warfarin Indication:  s/p VAD (HM3) , afib, hx DVT  No Active Allergies  Patient Measurements: Height: 6' (182.9 cm) Weight: 79.9 kg (176 lb 2.4 oz) IBW/kg (Calculated) : 77.6   Vital Signs: Temp: 98 F (36.7 C) (12/08 0506) Temp Source: Oral (12/08 0506) BP: 95/85 (12/08 0900) Pulse Rate: 91 (12/08 0506)  Labs: Recent Labs    04/17/21 0305 04/18/21 0317 04/19/21 0535  HGB 8.1* 8.4* 8.2*  HCT 26.0* 27.9* 27.9*  PLT 435* 494* 506*  LABPROT 22.8* 22.9* 21.2*  INR 2.0* 2.0* 1.8*  CREATININE 0.63 0.70 0.72     Estimated Creatinine Clearance: 98.3 mL/min (by C-G formula based on SCr of 0.72 mg/dL).   Medical History: Past Medical History:  Diagnosis Date   AICD (automatic cardioverter/defibrillator) present 08/31/2019   Ankylosing spondylitis ([place])    Arthritis    BENIGN PROSTATIC HYPERTROPHY 06/07/2008   CHF (congestive heart failure) ([place])    COLONIC POLYPS, HX OF 06/07/2008   Depression    H/O hiatal hernia    Heart failure ([place])    HYPERLIPIDEMIA 06/07/2008   HYPERTENSION 06/07/2008   Myocardial infarction [facility]) 2005   NSTEMI, s/p LAD stent   NEPHROLITHIASIS, HX OF 06/07/2008   STEMI (ST elevation myocardial infarction) ([place]) 04/27/2019    Assessment: 65 yo M s/p HM 3 and AVR on 04/06/21. Prior to surgery/admit patient was on apixiban for a DVT.  INR slightly subtherapeutic at 1.8, CBC and LDH stable. Pt s/p thora with plans for repeat tomorrow so will give low dose warfarin tonight.   Can drop ASA 81mg  x 30 days post implant, 12/25.   Goal of Therapy:  Warfarin INR 2.0-2.5 Monitor platelets by anticoagulation protocol: Yes   Plan:  Warfarin 1mg  PO x1 tonight Daily INR   Arrie Senate, PharmD, BCPS, [facility] Clinical Pharmacist [phone] Please check AMION for all [place] numbers 04/19/2021

## 2021-04-19 NOTE — Progress Notes (Signed)
Patient ID: Brandon [name], male   DOB: August 27, 1955, 65 y.o.   MRN: [id number]     Advanced Heart Failure Rounding Note  PCP-Cardiologist: [name], MD   Subjective:    Admitted with sepsis. Started on cefepime + vanc, now off abx (ID following). Afebrile.  10/29 Co-ox 48%, started on milrinone 0.25.  10/30 IV Antibiotics stopped. Blood cultures no growth for 5 days.  10/31 started on Augmentin for PNA.  11/1 Milrinone off 11/2 Milrinone added back. Midodrine increased to 10 mg TID 11/10 Developed Afib w/ RVR, converted with IV amio 11/14 Milrinone increased to 0.25 mcg. Diuresed with IV lasix  11/25 Underwent HM-3 VAD placement and bioprosthetic AVR. Had persistent hypotension in OR requiring methylene blue and high dose pressors.  11/26 Extubated 11/28 Started on lasix gtt>>later stopped due to low CVP (1) and increasing pressor requirements. Restarted on scheduled BID IV Lasix once pressure stabilized (still volume up)  11/28 Surgical path with evidence of possible "acute AoV endocarditis". ID consulted. Bcx recollected. Tissue sent to [facility] in [place].   11/30 Started on IV ceftriaxone to cover the Streptococcus gordonae that was isolated before, per ID. 11/30 VAD speed increased to 5600 12/1 Diuresed with IV lasix + metolazone. Milrinone was stopped. Amio drip transitioned to amio 200 mg twice a day.  12/5 Ramp Echo speed increased to 5500. Epi stopped.  12/6 Transferred to [place]. Had BM. Started on spiro 12.5 mg daily.   Co-ox 52% > 41%  MAPs 70s-80s  Chest xray - with bilateral pleural effusions  Going to IR for thoracentesis today     LVAD Interrogation HM 3: Speed: 5800 Flow: 5.2   PI:3.4 Power:5 VAD interrogated personally. Parameters stable.     Objective:   Weight Range: 79.9 kg Body mass index is 23.89 kg/m.   Vital Signs:   Temp:  [97.5 F (36.4 C)-98.1 F (36.7 C)] 98 F (36.7 C) (12/08 0506) Pulse Rate:  [90-98] 91 (12/08 0506) Resp:  [18-20] 20  (12/08 0506) BP: (82-111)/(72-80) 111/75 (12/08 0506) SpO2:  [91 %-100 %] 92 % (12/08 0506) Weight:  [79.9 kg] 79.9 kg (12/08 0506) Last BM Date: 04/17/21  Weight change: Filed Weights   04/17/21 0500 04/18/21 0307 04/19/21 0506  Weight: 82.5 kg 80.6 kg 79.9 kg    Intake/Output:   Intake/Output Summary (Last 24 hours) at 04/19/2021 1049 Last data filed at 04/19/2021 0630 Gross per 24 hour  Intake 1260 ml  Output 3175 ml  Net -1915 ml     Physical Exam   Physical Exam: GENERAL: No distress HEENT: normal  NECK: Supple,  JVP 7.  2+ bilaterally, no bruits.  No lymphadenopathy or thyromegaly appreciated.   CARDIAC:  Mechanical heart sounds with LVAD hum present.  LUNGS:  Diminished in bases ABDOMEN:  Soft, round, nontender, positive bowel sounds x4.     LVAD exit site: well-healed and incorporated.  Dressing dry and intact.  No erythema or drainage.  Stabilization device present and accurately applied.  Driveline dressing is being changed daily per sterile technique. EXTREMITIES:  Warm and dry, no cyanosis, clubbing, rash + RUE PICC, 1+ edema b/l NEUROLOGIC:  Alert and oriented x 4.  Gait steady.  No aphasia.  No dysarthria.  Affect pleasant.     .      Telemetry  NSR 90s (personally reviewed)    Labs    CBC Recent Labs    04/18/21 0317 04/19/21 0535  WBC 13.5* 12.2*  HGB 8.4* 8.2*  HCT 27.9* 27.9*  MCV 81.8 81.3  PLT 494* 662*   Basic Metabolic Panel Recent Labs    04/18/21 0317 04/19/21 0535  NA 131* 133*  K 3.6 3.5  CL 89* 89*  CO2 33* 34*  GLUCOSE 88 99  BUN 18 17  CREATININE 0.70 0.72  CALCIUM 8.7* 8.6*  MG 1.8  --    Liver Function Tests Recent Labs    04/17/21 0305  AST 25  ALT 26  ALKPHOS 90  BILITOT 0.6  PROT 5.5*  ALBUMIN 2.1*     No results for input(s): LIPASE, AMYLASE in the last 72 hours. Cardiac Enzymes No results for input(s): CKTOTAL, CKMB, CKMBINDEX, TROPONINI in the last 72 hours.  BNP: BNP (last 3 results) Recent  Labs    01/18/21 1629 04/07/21 0500 04/13/21 0333  BNP 607.9* 663.2* 438.2*    ProBNP (last 3 results) No results for input(s): PROBNP in the last 8760 hours.    D-Dimer No results for input(s): DDIMER in the last 72 hours. Hemoglobin A1C No results for input(s): HGBA1C in the last 72 hours.  Fasting Lipid Panel No results for input(s): CHOL, HDL, LDLCALC, TRIG, CHOLHDL, LDLDIRECT in the last 72 hours. Thyroid Function Tests No results for input(s): TSH, T4TOTAL, T3FREE, THYROIDAB in the last 72 hours.  Invalid input(s): FREET3  Other results:   Imaging    No results found.   Medications:     Scheduled Medications:  (feeding supplement) PROSource Plus  30 mL Oral TID PC & HS   sodium chloride   Intravenous Once   amiodarone  200 mg Oral BID   aspirin EC  81 mg Oral Daily   Chlorhexidine Gluconate Cloth  6 each Topical Daily   digoxin  0.125 mg Oral Daily   docusate sodium  200 mg Oral Daily   doxycycline  100 mg Oral Q12H   eplerenone  25 mg Oral Daily   ezetimibe  10 mg Oral Daily   feeding supplement  237 mL Oral TID BM   folic acid  1 mg Oral Daily   gabapentin  200 mg Oral TID   insulin aspart  0-15 Units Subcutaneous TID WC   insulin detemir  15 Units Subcutaneous Daily   mouth rinse  15 mL Mouth Rinse BID   melatonin  5 mg Oral QHS   midodrine  15 mg Oral TID WC   multivitamin with minerals  1 tablet Oral Daily   pantoprazole  40 mg Oral Daily   polyethylene glycol  17 g Oral Daily   sertraline  100 mg Oral Daily   sodium chloride flush  10-40 mL Intracatheter Q12H   sodium chloride flush  10-40 mL Intracatheter Q12H   sodium chloride flush  3 mL Intravenous Q12H   tamsulosin  0.4 mg Oral Daily   torsemide  40 mg Oral Daily   vitamin B-12  100 mcg Oral Daily   warfarin  2.5 mg Oral q1600   Warfarin - Pharmacist Dosing Inpatient   Does not apply q1600    Infusions:  sodium chloride Stopped (04/07/21 0943)   sodium chloride     sodium  chloride Stopped (04/09/21 2237)   sodium chloride Stopped (04/14/21 1149)   cefTRIAXone (ROCEPHIN)  IV 2 g (04/18/21 1038)   lactated ringers Stopped (04/13/21 1053)     PRN Medications: sodium chloride, sodium chloride, bisacodyl **OR** bisacodyl, morphine injection, ondansetron (ZOFRAN) IV, oxyCODONE, polyvinyl alcohol, sodium chloride flush, sodium chloride flush, traMADol  Patient Profile   Brandon [name] is a 65 year old with history of smoker, CAD s/p previous MI, HTN, HL, chronic HFeEF, sepsis 11/2020 bacteremia.  Admitted with sepsis. Bld Cx - NGTD    Assessment/Plan   1. Acute on chronic systolic HFr EF -> cardiogenic shock - Echo 05/19/20 EF 20-25% RV mildly HK. moderate AS  Mean gradient 13 AVA 1.2 cm2 DI 0.30 - Persistent NYHA IV. Admitted with shock - s/p HM-III VAD + bioprosthetic AVR on 11/25 - RV looked good on TEE in OR - Off pressors. Co-ox 52>41% - Volume up  - Continue midodrine 15 tid.  - Continue  torsemide 40 daily.  - Continue digoxin 0.125.  - Continue eplerenone 25 daily - Renal function stable.  Replace K, 3.5 today  2. HM-3 LVAD implant - Plan as above - transfuse to keep hgb > 8.0 - VAD interrogated personally. Parameters stable. - INR 2.0  Continue ASA 81 for 30 days. Stop ASA 05/14/21  - LDH remains stable at 193  -Had ramp ECHO speed increased to 5800. Rare PI events.   3. Acute Hypoxic Respiratory Failure  - Extubated 11/26, stable on 2L [place].    4. 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 CAD  s/p anterior STEMI (12/20). LHC showed chronically occluded RCA (with L>>R collaterals) and thrombotic occlusion of proximal LAD. Underwent PCI of LAD. - No chest pain.  - Continue atorvastatin 80.   6. Iron-deficiency anemia & ABLA - Rec'd Feraheme 11/8 and 11/13  - Keep hgb > 8.0 - Hgb 8.2 today.   7. Hypokalemia/ Hypomagnesemia  - Keep K > 4.0  Mg > 2.0  - K up to 3.5. Supp. - Magg supp 12/07. Recheck level.  8. Severe protein-calorie malnutrition - prealbumin 11>15> 13 >6.6 - Continue aggressive nutritional support  9. Paroxysmal Atrial Fibrillation w/ RVR - new, developed 11/10. Maintaining SR/ST    - Off amio drip. Continue amio 200 mg twice a day.  - on warfarin. INR 2    10. Hyponatremia - sodium 131>133 - Continue to restrict free water.   11. Sepsis - H/o strep bacteremia 11/2020.  ICD extracted 12/04/20. Barostim was not removed as it is extravascular.  - Received IV antibiotics until 01/15/21. Blood cultures negative 01/30/21..  - 10/26 -Blood CX x2  Negative  - Ct chest concerning for pneumonia. 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 Hemorrhagic Pericarditis - Fibrin clot encasing heart at time of surgery.  Will need to be careful with anticoagulation (go slowly) - PharmD managing .   13. 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  - Bcx recollected and pending  - Tissue sent to [facility] in [place] for broad 16 S ribosomal sequencing for bacterial pathogens as well as T wet Bhil[place] Bartonella Brucella  - ID following, recommending  IV ceftriaxone to cover the Streptococcus gordonae that was isolated before given possible acute AoV endocarditis on surgical path. - Ceftriaxone 2 g IV daily x 6 weeks. End Date: May 22, 2021  14. Constipation Resolved.    15. Bilateral pleural effusions -Going for  thoracentesis with IR, R or L today and other side tomorrow  HHPT/HHRN ordered. Home antibiotics. Carolynn Sayers aware.    Length of Stay: [place], [place], PA-C   04/19/2021, 10:49 AM  Advanced Heart Failure Team Pager [phone] (M-F; 7a - 5p)  Please contact [facility] for night-coverage after hours (5p -7a ) and weekends on [email or web address]   Patient seen and examined with the above-signed Advanced Practice Provider and/or Housestaff. I personally reviewed laboratory data, imaging studies and relevant notes. I independently examined the patient and formulated the important aspects of the plan. I have edited the note to reflect any of my changes or salient points. I have personally discussed the plan with the patient and/or family.  More DOE yesterday and today. CXR with significant bilateral pleural effusions. Underwent tap of left effusion today with 1.7 L out. Still with significant residual fluid but had to stop due to pleuritic pain. Co-ox low off milrinone   General:  NAD.  HEENT: normal  Neck: supple. JVP 6-7  Carotids 2+ bilat; no bruits. No lymphadenopathy or thryomegaly appreciated. Cor: LVAD hum.  Lungs: Decreased at bases Abdomen: obese soft, nontender, non-distended. No hepatosplenomegaly. No bruits or masses. Good bowel sounds. Driveline site clean. Anchor in place.  Extremities: no cyanosis, clubbing, rash. Warm tr edema  Neuro: alert & oriented x 3. No focal deficits. Moves all 4 without problem   Now with significant bilateral effusions. Underwent thora on left today but still with residual fluid. Will need R tap tomorrow and likely repeat L tap as well in a few days.   Co-ox low. Will repeat. Continue torsemide.   Would not push INR until taps complete. VAD interrogated personally. Parameters stable.   Glori Bickers, MD  11:45 AM

## 2021-04-19 NOTE — Progress Notes (Signed)
13 Days Post-Op Procedure(s) (LRB): INSERTION OF IMPLANTABLE LEFT VENTRICULAR ASSIST DEVICE (N/A) AORTIC VALVE REPLACEMENT (AVR) WITH INSPIRIS RESILIA  AORTIC VALVE SIZE 25MM (N/A) TRANSESOPHAGEAL ECHOCARDIOGRAM (TEE) (N/A) APPLICATION OF CELL SAVER Subjective: Had left thoracentesis today removing 1.7 L serosanguinous fluid. Stopped due to some pain. CXR afterward looks much better but probably a small amount of fluid remaining. Moderate right effusion still present and plan to drain tomorrow.  Breathing feels better after thoracentesis.  LVAD parameters stable. Objective: Vital signs in last 24 hours: Temp:  [97.5 F (36.4 C)-98.3 F (36.8 C)] 98.3 F (36.8 C) (12/08 1158) Pulse Rate:  [90-97] 96 (12/08 1158) Cardiac Rhythm: Normal sinus rhythm (12/08 0700) Resp:  [18-24] 24 (12/08 1158) BP: (82-120)/(72-105) 102/89 (12/08 1158) SpO2:  [91 %-100 %] 92 % (12/08 1158) Weight:  [79.9 kg] 79.9 kg (12/08 0506)  Hemodynamic parameters for last 24 hours: CVP:  [2 mmHg-5 mmHg] 5 mmHg  Intake/Output from previous day: 12/07 0701 - 12/08 0700 In: 6387 [P.O.:1400; I.V.:30; IV Piggyback:150] Out: 5643 [Urine:3175] Intake/Output this shift: No intake/output data recorded.  General appearance: alert and cooperative Neurologic: intact Heart: regular rate and rhythm, LVAD humm present. Lungs: diminished breath sounds RLL Extremities: edema minimal in feet Wound: incision healing well.  Lab Results: Recent Labs    04/18/21 0317 04/19/21 0535  WBC 13.5* 12.2*  HGB 8.4* 8.2*  HCT 27.9* 27.9*  PLT 494* 506*   BMET:  Recent Labs    04/18/21 0317 04/19/21 0535  NA 131* 133*  K 3.6 3.5  CL 89* 89*  CO2 33* 34*  GLUCOSE 88 99  BUN 18 17  CREATININE 0.70 0.72  CALCIUM 8.7* 8.6*    PT/INR:  Recent Labs    04/19/21 0535  LABPROT 21.2*  INR 1.8*   ABG    Component Value Date/Time   PHART 7.441 04/14/2021 0315   HCO3 32.0 (H) 04/14/2021 0315   TCO2 35 (H) 04/12/2021  0334   ACIDBASEDEF 7.0 (H) 11/30/2020 0839   O2SAT 41.2 04/19/2021 0804   CBG (last 3)  Recent Labs    04/18/21 2115 04/19/21 0613 04/19/21 1154  GLUCAP 122* 131* 117*    Assessment/Plan: S/P Procedure(s) (LRB): INSERTION OF IMPLANTABLE LEFT VENTRICULAR ASSIST DEVICE (N/A) AORTIC VALVE REPLACEMENT (AVR) WITH INSPIRIS RESILIA  AORTIC VALVE SIZE 25MM (N/A) TRANSESOPHAGEAL ECHOCARDIOGRAM (TEE) (N/A) APPLICATION OF CELL SAVER  POD 13.  He is hemodynamically stable. Co-ox low today but not sure how accurate that is. Possibly due to low sats from pleural effusions and atelectasis.  Plan right thoracentesis tomorrow.  Keep on dry side to prevent recurrent pleural effusions.   LOS: 42 days    Gaye Pollack 04/19/2021

## 2021-04-19 NOTE — Progress Notes (Signed)
Physical Therapy Treatment Patient Details Name: Brandon [name] MRN: [id number] DOB: Jul 29, 1955 Today's Date: 04/19/2021   History of Present Illness Pt is a 65 y.o. male admitted 03/07/21 with acute on chronic hypoxemic respiratory failure, cardiogenic shock. CT scan which shows bronchopneumonia and no indication of ILD. Prolonged admission undergoing workup for VAD. S/p LVAD placement 11/25. Plan for thoracentesis 12/8. Other PMH includes CHF, ankylosing spondylitis, bacteremia and  possible endocarditis s/p ICD removal, HTN, depression, arthritis.   PT Comments    Pt progressing with mobility. Today's session focused on education regarding energy conservation and DME needs, as pt significantly fatigued after using bathroom; pt moving with supervision for safety/lines. Pt hopeful SOB will be improved with thoracentesis later today. Will continue to follow acutely to address established goals.    Recommendations for follow up therapy are one component of a multi-disciplinary discharge planning process, led by the attending physician.  Recommendations may be updated based on patient status, additional functional criteria and insurance authorization.  Follow Up Recommendations  Home health PT     Assistance Recommended at Discharge Intermittent Supervision/Assistance  Equipment Recommendations  None recommended by PT    Recommendations for Other Services       Precautions / Restrictions Precautions Precautions: Fall;Sternal;Other (comment) Precaution Comments: LVAD Restrictions Weight Bearing Restrictions: Yes (sternal precautions)     Mobility  Bed Mobility               General bed mobility comments: Received in bathroom    Transfers Overall transfer level: Needs assistance Equipment used: None Transfers: Sit to/from Stand Sit to Stand: Supervision           General transfer comment: able to stand from low toilet height to IV pole with supervision for  safety/lines    Ambulation/Gait Ambulation/Gait assistance: Supervision Gait Distance (Feet): 12 Feet Assistive device: IV Pole Gait Pattern/deviations: Step-through pattern;Decreased stride length;Trunk flexed Gait velocity: Decreased     General Gait Details: Ambulating from bathroom to recliner with IV pole and supervision for safety/lines; pt with good awareness managing LVAD power cord still attached to wall power   Stairs             Wheelchair Mobility    Modified Rankin (Stroke Patients Only)       Balance Overall balance assessment: Needs assistance Sitting-balance support: Feet supported;No upper extremity supported Sitting balance-Leahy Scale: Good     Standing balance support: No upper extremity supported;Single extremity supported Standing balance-Leahy Scale: Fair                              Cognition Arousal/Alertness: Awake/alert Behavior During Therapy: WFL for tasks assessed/performed Overall Cognitive Status: Impaired/Different from baseline Area of Impairment: Memory;Problem solving                   Current Attention Level: Selective;Alternating Memory: Decreased short-term memory       Problem Solving: Requires verbal cues          Exercises      General Comments General comments (skin integrity, edema, etc.): Increased time discussing energy conservation strategies (handout provided) as pt "worn out" after using bathroom, declining further mobility secondary to this. Discussed DME needs as pt using a variety since admission - pt reports plan to use [employer] which he owns, not interested in rollator ("I know I won't use it"). Pt on 3L O2 [place]      Pertinent Vitals/Pain  Pain Assessment: No/denies pain Pain Intervention(s): Monitored during session    Home Living                          Prior Function            PT Goals (current goals can now be found in the care plan section) Acute Rehab PT  Goals Patient Stated Goal: return to walking without a device PT Goal Formulation: With patient Time For Goal Achievement: 05/03/21 Potential to Achieve Goals: Good Progress towards PT goals: Progressing toward goals    Frequency    Min 3X/week      PT Plan Current plan remains appropriate    Co-evaluation              AM-PAC PT "6 Clicks" Mobility   Outcome Measure  Help needed turning from your back to your side while in a flat bed without using bedrails?: None Help needed moving from lying on your back to sitting on the side of a flat bed without using bedrails?: A Little Help needed moving to and from a bed to a chair (including a wheelchair)?: A Little Help needed standing up from a chair using your arms (e.g., wheelchair or bedside chair)?: A Little Help needed to walk in hospital room?: A Little Help needed climbing 3-5 steps with a railing? : A Little 6 Click Score: 19    End of Session Equipment Utilized During Treatment: Oxygen Activity Tolerance: Patient limited by fatigue Patient left: in chair;with call bell/phone within reach Nurse Communication: Mobility status PT Visit Diagnosis: Difficulty in walking, not elsewhere classified (R26.2);Muscle weakness (generalized) (M62.81)     Time: 2330-0762 PT Time Calculation (min) (ACUTE ONLY): 13 min  Charges:  $Self Care/Home Management: [place], PT, DPT Acute Rehabilitation Services  Pager [phone] Office [phone]  Derry Lory 04/19/2021, 9:44 AM

## 2021-04-19 NOTE — Progress Notes (Signed)
Mobility Specialist: Progress Note   04/19/21 1758  Mobility  Activity Ambulated in hall  Level of Assistance Minimal assist, patient does 75% or more  Assistive Device Front wheel walker  Distance Ambulated (ft) 128 ft (64'x2)  Mobility Ambulated with assistance in hallway  Mobility Response Tolerated fair  Mobility performed by Mobility specialist  Bed Position Chair  $Mobility charge 1 Mobility   Pt required minA to stand from recliner as well as assistance with setting up equipment for ambulation. Pt c/o back pain during ambulation requiring one seated break, otherwise no other c/o. Pt back to recliner after walk with RN present in the room.   [facility] Elihue Ebert Mobility Specialist Mobility Specialist Phone #1: [phone] Mobility Specialist Phone #2: [phone]

## 2021-04-20 ENCOUNTER — Inpatient Hospital Stay (HOSPITAL_COMMUNITY): Payer: Medicare Other

## 2021-04-20 ENCOUNTER — Ambulatory Visit: Payer: Medicare Other

## 2021-04-20 HISTORY — PX: IR THORACENTESIS ASP PLEURAL SPACE W/IMG GUIDE: IMG5380

## 2021-04-20 LAB — BASIC METABOLIC PANEL
Anion gap: 7 (ref 5–15)
BUN: 18 mg/dL (ref 8–23)
CO2: 33 mmol/L — ABNORMAL HIGH (ref 22–32)
Calcium: 8.6 mg/dL — ABNORMAL LOW (ref 8.9–10.3)
Chloride: 90 mmol/L — ABNORMAL LOW (ref 98–111)
Creatinine, Ser: 0.66 mg/dL (ref 0.61–1.24)
GFR, Estimated: >60 mL/min (ref >60)
Glucose, Bld: 98 mg/dL (ref 70–99)
Potassium: 4 mmol/L (ref 3.5–5.1)
Sodium: 130 mmol/L — ABNORMAL LOW (ref 135–145)

## 2021-04-20 LAB — GLUCOSE, CAPILLARY
Glucose-Capillary: 118 mg/dL — ABNORMAL HIGH (ref 70–99)
Glucose-Capillary: 149 mg/dL — ABNORMAL HIGH (ref 70–99)
Glucose-Capillary: 111 mg/dL — ABNORMAL HIGH (ref 70–99)
Glucose-Capillary: 104 mg/dL — ABNORMAL HIGH (ref 70–99)

## 2021-04-20 LAB — GRAM STAIN

## 2021-04-20 LAB — CBC
HCT: 27.8 % — ABNORMAL LOW (ref 39.0–52.0)
Hemoglobin: 8.3 g/dL — ABNORMAL LOW (ref 13.0–17.0)
MCH: 24.5 pg — ABNORMAL LOW (ref 26.0–34.0)
MCHC: 29.9 g/dL — ABNORMAL LOW (ref 30.0–36.0)
MCV: 82 fL (ref 80.0–100.0)
Platelets: 483 10*3/uL — ABNORMAL HIGH (ref 150–400)
RBC: 3.39 MIL/uL — ABNORMAL LOW (ref 4.22–5.81)
RDW: 19.5 % — ABNORMAL HIGH (ref 11.5–15.5)
WBC: 12.5 10*3/uL — ABNORMAL HIGH (ref 4.0–10.5)
nRBC: 0 % (ref 0.0–0.2)

## 2021-04-20 LAB — COOXEMETRY PANEL
Carboxyhemoglobin: 1.8 % — ABNORMAL HIGH (ref 0.5–1.5)
Methemoglobin: 0.8 % (ref 0.0–1.5)
O2 Saturation: 57 %
Total hemoglobin: 8.9 g/dL — ABNORMAL LOW (ref 12.0–16.0)

## 2021-04-20 LAB — PROTIME-INR
INR: 1.7 — ABNORMAL HIGH (ref 0.8–1.2)
Prothrombin Time: 20.2 seconds — ABNORMAL HIGH (ref 11.4–15.2)

## 2021-04-20 LAB — MAGNESIUM: Magnesium: 1.8 mg/dL (ref 1.7–2.4)

## 2021-04-20 LAB — LACTATE DEHYDROGENASE: LDH: 160 U/L (ref 98–192)

## 2021-04-20 LAB — BRAIN NATRIURETIC PEPTIDE: B Natriuretic Peptide: 193 pg/mL — ABNORMAL HIGH (ref 0.0–100.0)

## 2021-04-20 MED ORDER — MAGNESIUM SULFATE 2 GM/50ML IV SOLN
2.0000 g | Freq: Once | INTRAVENOUS | Status: AC
  Administered 2021-04-20: 2 g via INTRAVENOUS
  Filled 2021-04-20: qty 50

## 2021-04-20 MED ORDER — WARFARIN SODIUM 1 MG PO TABS
1.0000 mg | ORAL_TABLET | Freq: Once | ORAL | Status: AC
  Administered 2021-04-20: 1 mg via ORAL
  Filled 2021-04-20: qty 1

## 2021-04-20 MED ORDER — LIDOCAINE HCL 1 % IJ SOLN
INTRAMUSCULAR | Status: AC
  Filled 2021-04-20: qty 20

## 2021-04-20 NOTE — Plan of Care (Signed)
  Problem: Education: Goal: Knowledge of General Education information will improve Description: Including pain rating scale, medication(s)/side effects and non-pharmacologic comfort measures Outcome: Progressing   Problem: Health Behavior/Discharge Planning: Goal: Ability to manage health-related needs will improve Outcome: Progressing   Problem: Clinical Measurements: Goal: Ability to maintain clinical measurements within normal limits will improve Outcome: Progressing Goal: Will remain free from infection Outcome: Progressing Goal: Diagnostic test results will improve Outcome: Progressing Goal: Respiratory complications will improve Outcome: Progressing Goal: Cardiovascular complication will be avoided Outcome: Progressing   Problem: Activity: Goal: Risk for activity intolerance will decrease Outcome: Progressing   Problem: Nutrition: Goal: Adequate nutrition will be maintained Outcome: Progressing   Problem: Coping: Goal: Level of anxiety will decrease Outcome: Progressing   Problem: Elimination: Goal: Will not experience complications related to bowel motility Outcome: Progressing Goal: Will not experience complications related to urinary retention Outcome: Progressing   Problem: Pain Managment: Goal: General experience of comfort will improve Outcome: Progressing   Problem: Safety: Goal: Ability to remain free from injury will improve Outcome: Progressing   Problem: Skin Integrity: Goal: Risk for impaired skin integrity will decrease Outcome: Progressing   Problem: Education: Goal: Ability to demonstrate management of disease process will improve Outcome: Progressing Goal: Ability to verbalize understanding of medication therapies will improve Outcome: Progressing Goal: Individualized Educational Video(s) Outcome: Progressing   Problem: Activity: Goal: Capacity to carry out activities will improve Outcome: Progressing   Problem: Cardiac: Goal:  Ability to achieve and maintain adequate cardiopulmonary perfusion will improve Outcome: Progressing   Problem: Education: Goal: Knowledge of the prescribed therapeutic regimen will improve Outcome: Progressing   Problem: Activity: Goal: Risk for activity intolerance will decrease Outcome: Progressing   Problem: Cardiac: Goal: Ability to maintain an adequate cardiac output will improve Outcome: Progressing   Problem: Coping: Goal: Level of anxiety will decrease Outcome: Progressing   Problem: Fluid Volume: Goal: Risk for excess fluid volume will decrease Outcome: Progressing   Problem: Clinical Measurements: Goal: Ability to maintain clinical measurements within normal limits will improve Outcome: Progressing Goal: Will remain free from infection Outcome: Progressing

## 2021-04-20 NOTE — Progress Notes (Signed)
14 Days Post-Op Procedure(s) (LRB): INSERTION OF IMPLANTABLE LEFT VENTRICULAR ASSIST DEVICE (N/A) AORTIC VALVE REPLACEMENT (AVR) WITH INSPIRIS RESILIA  AORTIC VALVE SIZE 25MM (N/A) TRANSESOPHAGEAL ECHOCARDIOGRAM (TEE) (N/A) APPLICATION OF CELL SAVER Subjective:  Feels ok. Had right thoracentesis today and removed 1.1 L of effusion. Breathing seems better but still some pain with deep breath.  Objective: Vital signs in last 24 hours: Temp:  [97.6 F (36.4 C)-98.3 F (36.8 C)] 98.2 F (36.8 C) (12/09 1128) Pulse Rate:  [82-99] 99 (12/09 1128) Cardiac Rhythm: Normal sinus rhythm (12/09 0700) Resp:  [15-20] 15 (12/09 1128) BP: (79-105)/(52-93) 98/85 (12/09 1128) SpO2:  [90 %-100 %] 100 % (12/09 1128) Weight:  [79.3 kg] 79.3 kg (12/09 0502)  Hemodynamic parameters for last 24 hours: CVP:  [4 mmHg-7 mmHg] 7 mmHg  Intake/Output from previous day: 12/08 0701 - 12/09 0700 In: 710 [P.O.:600; I.V.:10; IV Piggyback:100] Out: 925 [Urine:925] Intake/Output this shift: Total I/O In: 480 [P.O.:480] Out: 400 [Urine:400]  General appearance: alert and cooperative Heart: regular rate and rhythm, distant heart sounds with LVAD humm presentl, no murmur Lungs: diminished breath sounds left base Wound: incision ok  Lab Results: Recent Labs    04/19/21 0535 04/20/21 0500  WBC 12.2* 12.5*  HGB 8.2* 8.3*  HCT 27.9* 27.8*  PLT 506* 483*   BMET:  Recent Labs    04/19/21 0535 04/20/21 0500  NA 133* 130*  K 3.5 4.0  CL 89* 90*  CO2 34* 33*  GLUCOSE 99 98  BUN 17 18  CREATININE 0.72 0.66  CALCIUM 8.6* 8.6*    PT/INR:  Recent Labs    04/20/21 0500  LABPROT 20.2*  INR 1.7*   ABG    Component Value Date/Time   PHART 7.441 04/14/2021 0315   HCO3 32.0 (H) 04/14/2021 0315   TCO2 35 (H) 04/12/2021 0334   ACIDBASEDEF 7.0 (H) 11/30/2020 0839   O2SAT 57.0 04/20/2021 0627   CBG (last 3)  Recent Labs    04/19/21 2138 04/20/21 0624 04/20/21 1130  GLUCAP 101* 118* 149*   CXR  after right thoracentesis shows complete drainage of the right pleural effusion and there is still small left pleural effusion.  Assessment/Plan: S/P Procedure(s) (LRB): INSERTION OF IMPLANTABLE LEFT VENTRICULAR ASSIST DEVICE (N/A) AORTIC VALVE REPLACEMENT (AVR) WITH INSPIRIS RESILIA  AORTIC VALVE SIZE 25MM (N/A) TRANSESOPHAGEAL ECHOCARDIOGRAM (TEE) (N/A) APPLICATION OF CELL SAVER  POD 14 Hemodynamics stable with Co-ox 57% this am.  Continue IS, flutter valve over the weekend.   Keep fluid balance negative. Wt is about 3 lbs over preop.  Will repeat CXR on Monday am to reassess the pleural effusions. If the left effusion is enlarging I would try to tap dry.   LOS: 43 days    Gaye Pollack 04/20/2021

## 2021-04-20 NOTE — Progress Notes (Signed)
ANTICOAGULATION CONSULT NOTE   Pharmacy Consult for warfarin Indication:  s/p VAD (HM3) , afib, hx DVT  No Active Allergies  Patient Measurements: Height: 6' (182.9 cm) Weight: 79.3 kg (174 lb 13.2 oz) IBW/kg (Calculated) : 77.6   Vital Signs: Temp: 98.2 F (36.8 C) (12/09 1128) Temp Source: Oral (12/09 1128) BP: 98/85 (12/09 1128) Pulse Rate: 99 (12/09 1128)  Labs: Recent Labs    04/18/21 0317 04/19/21 0535 04/20/21 0500  HGB 8.4* 8.2* 8.3*  HCT 27.9* 27.9* 27.8*  PLT 494* 506* 483*  LABPROT 22.9* 21.2* 20.2*  INR 2.0* 1.8* 1.7*  CREATININE 0.70 0.72 0.66     Estimated Creatinine Clearance: 98.3 mL/min (by C-G formula based on SCr of 0.66 mg/dL).   Medical History: Past Medical History:  Diagnosis Date   AICD (automatic cardioverter/defibrillator) present 08/31/2019   Ankylosing spondylitis ([place])    Arthritis    BENIGN PROSTATIC HYPERTROPHY 06/07/2008   CHF (congestive heart failure) ([place])    COLONIC POLYPS, HX OF 06/07/2008   Depression    H/O hiatal hernia    Heart failure ([place])    HYPERLIPIDEMIA 06/07/2008   HYPERTENSION 06/07/2008   Myocardial infarction [facility]) 2005   NSTEMI, s/p LAD stent   NEPHROLITHIASIS, HX OF 06/07/2008   STEMI (ST elevation myocardial infarction) ([place]) 04/27/2019    Assessment: 65 yo M s/p HM 3 and AVR on 04/06/21. Prior to surgery/admit patient was on apixiban for a DVT.  INR slightly subtherapeutic at 1.7, Hgb stable low 8s and LDH stable 160. Pt s/p thora yesterday and today with possible plans for repeat in a few days. HF team requesting not to push INR up until clear from procedures. Will repeat yesterday's dose.    Can drop ASA 81mg  x 30 days post implant, 12/25.   Goal of Therapy:  Warfarin INR 2.0-2.5 Monitor platelets by anticoagulation protocol: Yes   Plan:  Warfarin 1mg  PO x1 again tonight Daily INR  Erin Hearing PharmD., BCPS Clinical Pharmacist 04/20/2021 12:17 PM

## 2021-04-20 NOTE — TOC CM/SW Note (Signed)
HF TOC CM spoke to pt at bedside. Pt may need oxygen for home. [place] and [facility] IV infusion coordinator to make aware pt will not dc today. Possible dc on Monday, 04/23/21. [place], Heart Failure TOC CM [phone]

## 2021-04-20 NOTE — Progress Notes (Signed)
CARDIAC REHAB PHASE I   PRE:  Rate/Rhythm: 92 SR  BP:  Sitting: 91/79 (85)      SaO2: 90 2L  MODE:  Ambulation: 300 ft   POST:  Rate/Rhythm: 99 SR  BP:  Sitting: 103/63    SaO2: 95 2L   Pt ambulated in hallway ax2 with mobility team. Pt ambulated for a total of 38ft, with 3 seated rest breaks. Pt c/o fatigue and lung pain needing to be wheeled back to room. Pt able to switch to and from wall power with some guidance. Pt continues to become more proficient with practice. Stressed importance of OOB and continued mobility throughout the weekend. Will continue to follow.  [id number] Rufina Falco, RN BSN 04/20/2021 2:33 PM

## 2021-04-20 NOTE — Progress Notes (Signed)
LVAD Coordinator Rounding Note:  Admitted 03/07/21 due to sepsis. Dr. [name] consulted as his HF Cardiologist.   HM III LVAD implanted on 04/06/21 by Dr. Cyndia Bent under Destination Therapy criteria. Not a transplant candidate at this time due to recurrent infections.   Pt up in the chair on my arrival. Reports his SOB has improved since left thoracentesis yesterday. O2 sats stable on 3L [place]. Reports left shoulder pain has improved since yesterday, but is still present. VAD coordinator accompanied pt to IR for thoracentesis this morning. Pt tolerated the procedure well. 1.1 L drained from right lung.  Antibiotics per ID for possible AV endocarditis. Per ID note on 04/11/21 he will need 6 weeks IV antibiotics. Currently receiving Ceftriaxone daily. Per ID tissue specimen sent to [facility] for 16 S ribosomal sequencing per PCR- results pending. Advanced home health and [facility] set up for medication/PICC maintenance at hospital discharge per case manager. Will need ID appt rescheduled at discharge.   Patient's home equipment arrived. Patient and family VAD discharge education complete.   Vital signs: Temp: 98 HR: 84 Doppler: 82 Automatic BP: 93/52 (73) O2 Sat:  95% on 3 L/[place] Wt:171.5>190>187.4>187>183.8>184>185>180.3>181.8>177.6>176.15>174.8 lbs     LVAD interrogation reveals:  Speed: 5800 Flow: 5.1 Power: 4.7w PI: 3.4  Alarms: none Events: 2 PI events  Hematocrit: 28  Fixed speed: 5600 Low speed limit: 5300  Drive Line:  Existing VAD dressing is clean, dry, and intact. Anchor correctly applied. Advanced to twice a week dressing changes on Monday and Thursday per VAD Coordinator, Nurse Davonna Belling, or Tomi Bamberger. Next dressing change due 04/23/21.   Labs:  LDH trend: 309>248>215>206>201>167>165>158>161>193>160  INR trend: 1.6>1.3>1.3>1.4>1.5>1.6>1.8>2.0>2.0>2.0>1.8>1.7  Anticoagulation Plan: -INR Goal: 2 - 2.5 -ASA Dose: 81 mg daily  Blood Products:   Intra-op: -  04/06/21>>4 PRBCs, 4 FFP, 1263 cc cell saver  Post-op: - 04/06/21>>1 unit Plts - 04/07/21>>1 unit PRBC  Device: N/A  Arrythmias: 03/22/21 - developed Afib w/ RVR, converted with IV amiodarone  Respiratory: extubated 04/07/21  Nitric Oxide: off 04/07/21  Gtts:   Infection: - 04/11/21 Blood cultures x 2>> no growth x 5 days; final - 04/19/21 Body fluid cx (left lung)>> no growth <24 hours - 04/19/21 Gram stain (left lung)>> no WBC or organisms seen; final - 04/20/21 Gram stain (right lung)>>  Patient/Family Teaching:  VAD discharge teaching completed with patient, his sister [name], and his friend [name]. See separate note for documentation.  [name] able to do dressing changes with bedside nurse.  Plan/Recommendations:  1. Call VAD Coordinator for any VAD equipment or drive line issues. 2.  Daily dressing changes per VAD Coordinator, Nurse Davonna Belling, or trained caregiver. 3. VAD discharge teaching completed with patient and family.   Emerson Monte RN [place] Coordinator  Office: [phone]  24/7 Pager: [phone]

## 2021-04-20 NOTE — Progress Notes (Addendum)
Mobility Specialist: Progress Note   04/20/21 1355  Mobility  Activity Ambulated in hall  Level of Assistance +2 (takes two people) (Chair follow)  Assistive Device Four wheel walker  Distance Ambulated (ft) 300 ft  Mobility Ambulated with assistance in hallway  Mobility Response Tolerated fair  Mobility performed by Mobility specialist  Bed Position Chair  $Mobility charge 1 Mobility   Post-Mobility: 99 HR, 103/63 ( 77) BP, 95% SpO2  Pt required minA to stand and was standby assist during ambulation. Pt did better today with transferring to/from batteries for ambulation. Pt stopped x3 for seated breaks and needed to be wheeled back to the room at the end of session d/t fatigue and feeling dizzy. Pt to recliner after walk with call bell and phone in reach. Family present in the room.   [facility] Arnie Clingenpeel Mobility Specialist Mobility Specialist Phone #1: [phone] Mobility Specialist Phone #2: [phone]

## 2021-04-20 NOTE — Progress Notes (Addendum)
Patient ID: Brandon [name], male   DOB: 07-04-1955, 65 y.o.   MRN: [id number]     Advanced Heart Failure Rounding Note  PCP-Cardiologist: [name], MD  [facility]: Dr. [name]   Subjective:    Admitted with sepsis. Started on cefepime + vanc, now off abx (ID following). Afebrile.  10/29 Co-ox 48%, started on milrinone 0.25.  10/30 IV Antibiotics stopped. Blood cultures no growth for 5 days.  10/31 started on Augmentin for PNA.  11/1 Milrinone off 11/2 Milrinone added back. Midodrine increased to 10 mg TID 11/10 Developed Afib w/ RVR, converted with IV amio 11/14 Milrinone increased to 0.25 mcg. Diuresed with IV lasix  11/25 Underwent HM-3 VAD placement and bioprosthetic AVR. Had persistent hypotension in OR requiring methylene blue and high dose pressors.  11/26 Extubated 11/28 Started on lasix gtt>>later stopped due to low CVP (1) and increasing pressor requirements. Restarted on scheduled BID IV Lasix once pressure stabilized (still volume up)  11/28 Surgical path with evidence of possible "acute AoV endocarditis". ID consulted. Bcx recollected. Tissue sent to [facility] in [place].   11/30 Started on IV ceftriaxone to cover the Streptococcus gordonae that was isolated before, per ID. 11/30 VAD speed increased to 5600 12/1 Diuresed with IV lasix + metolazone. Milrinone was stopped. Amio drip transitioned to amio 200 mg twice a day.  12/5 Ramp Echo speed increased to 5500. Epi stopped.  12/6 Transferred to [place]. Had BM. Started on spiro 12.5 mg daily 12/8 CXR with significant bilateral pleural effusions. S/p Left Thoracentesis w/ 1.7 L out but still with residual fluid 12/9 Rt Thoracentesis   Co-ox better today, up from 41>>57%. CVP 6   Breathing improved post left thoracentesis. Scheduled to get right thora today. On 2L [place].   Good spirits today. Denies CP. No current resting dyspnea. Appetite is good.   INR 1.7    LVAD Interrogation HM 3: Speed: 5800 Flow: 5.2   PI:3.6 Power: 4.7. No  PI events VAD interrogated personally. Parameters stable.     Objective:   Weight Range: 79.3 kg Body mass index is 23.71 kg/m.   Vital Signs:   Temp:  [97.6 F (36.4 C)-98.3 F (36.8 C)] 98 F (36.7 C) (12/09 0757) Pulse Rate:  [82-96] 84 (12/09 0757) Resp:  [19-24] 19 (12/09 0757) BP: (79-120)/(52-105) 93/52 (12/09 0757) SpO2:  [90 %-98 %] 90 % (12/09 0757) Weight:  [79.3 kg] 79.3 kg (12/09 0502) Last BM Date: 04/19/21  Weight change: Filed Weights   04/18/21 0307 04/19/21 0506 04/20/21 0502  Weight: 80.6 kg 79.9 kg 79.3 kg    Intake/Output:   Intake/Output Summary (Last 24 hours) at 04/20/2021 0859 Last data filed at 04/20/2021 0445 Gross per 24 hour  Intake 710 ml  Output 925 ml  Net -215 ml     Physical Exam   Physical Exam: GENERAL: thin WM, sitting up in chair. No distress  HEENT: normal  NECK: Supple,  JVD not elevated.  2+ bilaterally, no bruits.  No lymphadenopathy or thyromegaly appreciated.   CARDIAC:  Mechanical heart sounds with LVAD hum present.  LUNGS:  decreased BS RLL, no wheezing  ABDOMEN:  Soft, round, nontender, positive bowel sounds x4.     LVAD exit site: well-healed and incorporated.  Dressing dry and intact.  No erythema or drainage.  Stabilization device present and accurately applied.  Driveline dressing is being changed daily per sterile technique. EXTREMITIES:  Warm and dry, no cyanosis, clubbing, rash + RUE PICC, 1+ bilateral pretibial edema  NEUROLOGIC:  Alert and oriented x 4.  Gait steady.  No aphasia.  No dysarthria.  Affect pleasant.      Telemetry   NSR 70s  (personally reviewed)  Labs    CBC Recent Labs    04/19/21 0535 04/20/21 0500  WBC 12.2* 12.5*  HGB 8.2* 8.3*  HCT 27.9* 27.8*  MCV 81.3 82.0  PLT 506* 818*   Basic Metabolic Panel Recent Labs    04/18/21 0317 04/19/21 0535 04/20/21 0500  NA 131* 133* 130*  K 3.6 3.5 4.0  CL 89* 89* 90*  CO2 33* 34* 33*  GLUCOSE 88 99 98  BUN 18 17 18   CREATININE  0.70 0.72 0.66  CALCIUM 8.7* 8.6* 8.6*  MG 1.8  --  1.8   Liver Function Tests No results for input(s): AST, ALT, ALKPHOS, BILITOT, PROT, ALBUMIN in the last 72 hours.    No results for input(s): LIPASE, AMYLASE in the last 72 hours. Cardiac Enzymes No results for input(s): CKTOTAL, CKMB, CKMBINDEX, TROPONINI in the last 72 hours.  BNP: BNP (last 3 results) Recent Labs    04/07/21 0500 04/13/21 0333 04/20/21 0600  BNP 663.2* 438.2* 193.0*    ProBNP (last 3 results) No results for input(s): PROBNP in the last 8760 hours.    D-Dimer No results for input(s): DDIMER in the last 72 hours. Hemoglobin A1C No results for input(s): HGBA1C in the last 72 hours.  Fasting Lipid Panel No results for input(s): CHOL, HDL, LDLCALC, TRIG, CHOLHDL, LDLDIRECT in the last 72 hours. Thyroid Function Tests No results for input(s): TSH, T4TOTAL, T3FREE, THYROIDAB in the last 72 hours.  Invalid input(s): FREET3  Other results:   Imaging    DG Chest 1 View  Result Date: 04/19/2021 CLINICAL DATA:  65 year old male status post left-sided thoracentesis. EXAM: CHEST  1 VIEW COMPARISON:  Chest x-ray 04/18/2021. FINDINGS: Previously noted left-sided pleural effusion has decreased slightly, currently small. No definite postprocedural pneumothorax. Small right pleural effusion also persists. Bibasilar opacities are noted, which may reflect areas of atelectasis and/or consolidation. Widespread areas of interstitial prominence are again noted in the lungs bilaterally, along with cephalization of the pulmonary vasculature, suggesting a background of mild interstitial pulmonary edema. Heart size remains enlarged. Left ventricular assist device projecting over the lower left hemithorax. Status post median sternotomy. Right-sided electronic device with lead extending cephalad, presumably a deep brain stimulator. There is a right upper extremity PICC with tip terminating in the superior cavoatrial junction.  IMPRESSION: 1. Slight decreased size of what is now a small left pleural effusion following thoracentesis. No postprocedural pneumothorax or other acute findings. 2. The appearance the chest suggest congestive heart failure, as above. 3. Postoperative changes and support apparatus, as above. Electronically Signed   By: Vinnie Langton M.D.   On: 04/19/2021 11:26   IR THORACENTESIS ASP PLEURAL SPACE W/IMG GUIDE  Result Date: 04/19/2021 INDICATION: Patient with left ventricular assist device presents today with bilateral pleural effusions. Interventional radiology asked to perform a diagnostic and therapeutic thoracentesis. EXAM: ULTRASOUND GUIDED THORACENTESIS MEDICATIONS: 1% lidocaine 10 mL COMPLICATIONS: None immediate. PROCEDURE: An ultrasound guided thoracentesis was thoroughly discussed with the patient and questions answered. The benefits, risks, alternatives and complications were also discussed. The patient understands and wishes to proceed with the procedure. Written consent was obtained. Ultrasound was performed to localize and mark an adequate pocket of fluid in the left chest. The area was then prepped and draped in the normal sterile fashion. 1% Lidocaine was used for  local anesthesia. Under ultrasound guidance a 6 Fr Safe-T-Centesis catheter was introduced. Thoracentesis was performed. The catheter was removed and a dressing applied. FINDINGS: A total of approximately 1.7 L of light red fluid was removed. Samples were sent to the laboratory as requested by the clinical team. IMPRESSION: Successful ultrasound guided left thoracentesis yielding 1.7 L of pleural fluid. Read by: Soyla Dryer, NP Electronically Signed   By: Jacqulynn Cadet M.D.   On: 04/19/2021 11:41     Medications:     Scheduled Medications:  (feeding supplement) PROSource Plus  30 mL Oral TID PC & HS   sodium chloride   Intravenous Once   amiodarone  200 mg Oral BID   aspirin EC  81 mg Oral Daily   Chlorhexidine  Gluconate Cloth  6 each Topical Daily   digoxin  0.125 mg Oral Daily   docusate sodium  200 mg Oral Daily   doxycycline  100 mg Oral Q12H   eplerenone  25 mg Oral Daily   ezetimibe  10 mg Oral Daily   feeding supplement  237 mL Oral TID BM   folic acid  1 mg Oral Daily   gabapentin  200 mg Oral TID   insulin aspart  0-15 Units Subcutaneous TID WC   insulin detemir  15 Units Subcutaneous Daily   mouth rinse  15 mL Mouth Rinse BID   melatonin  5 mg Oral QHS   midodrine  15 mg Oral TID WC   multivitamin with minerals  1 tablet Oral Daily   pantoprazole  40 mg Oral Daily   polyethylene glycol  17 g Oral Daily   sertraline  100 mg Oral Daily   sodium chloride flush  10-40 mL Intracatheter Q12H   sodium chloride flush  10-40 mL Intracatheter Q12H   sodium chloride flush  3 mL Intravenous Q12H   tamsulosin  0.4 mg Oral Daily   torsemide  40 mg Oral Daily   vitamin B-12  100 mcg Oral Daily   Warfarin - Pharmacist Dosing Inpatient   Does not apply q1600    Infusions:  sodium chloride Stopped (04/07/21 0943)   sodium chloride     sodium chloride Stopped (04/09/21 2237)   sodium chloride Stopped (04/14/21 1149)   cefTRIAXone (ROCEPHIN)  IV Stopped (04/19/21 1225)   lactated ringers Stopped (04/13/21 1053)     PRN Medications: sodium chloride, sodium chloride, bisacodyl **OR** bisacodyl, lidocaine, morphine injection, ondansetron (ZOFRAN) IV, oxyCODONE, polyvinyl alcohol, sodium chloride flush, sodium chloride flush, traMADol    Patient Profile   Mr Convey is a 65 year old with history of smoker, CAD s/p previous MI, HTN, HL, chronic HFeEF, sepsis 11/2020 bacteremia.  Admitted with sepsis. Bld Cx - NGTD    Assessment/Plan   1. Acute on chronic systolic HFr EF -> cardiogenic shock - Echo 05/19/20 EF 20-25% RV mildly HK. moderate AS  Mean gradient 13 AVA 1.2 cm2 DI 0.30 - Persistent NYHA IV. Admitted with shock - s/p HM-III VAD + bioprosthetic AVR on 11/25 - RV looked good on TEE  in OR - Off pressors. Co-ox 57% - Volume status ok CVP 6 - Continue midodrine 15 tid.  - Continue torsemide 40 daily.  - Continue digoxin 0.125.  - Continue eplerenone 25 daily - Renal function stable.  K 4.0   2. HM-3 LVAD implant - Plan as above - transfuse to keep hgb > 8.0 - VAD interrogated personally. Parameters stable. - INR 1.7  Continue ASA 81 for 30  days. Stop ASA 05/14/21  - LDH remains stable at 160 - Had ramp ECHO speed increased to 5800. Rare PI events.   3. Acute Hypoxic Respiratory Failure  - Extubated 11/26, stable on 2L [place].    4. 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 CAD  s/p anterior STEMI (12/20). LHC showed chronically occluded RCA (with L>>R collaterals) and thrombotic occlusion of proximal LAD. Underwent PCI of LAD. - No chest pain.  - Continue atorvastatin 80.   6. Iron-deficiency anemia & ABLA - Rec'd Feraheme 11/8 and 11/13  - Keep hgb > 8.0 - Hgb 8.3 today.   7. Hypokalemia/ Hypomagnesemia  - Keep K > 4.0 Mg > 2.0  - K 4.0. Mg 1.8 today (supp Mg ordered)    8. Severe protein-calorie malnutrition - prealbumin 11>15> 13 >6.6 - Continue aggressive nutritional support  9. Paroxysmal Atrial Fibrillation w/ RVR - new, developed 11/10. Maintaining SR/ST    - Off amio drip. Continue amio 200 mg twice a day.  - on warfarin. INR 1.7    10. Hyponatremia - sodium 131>133>130  - Continue to restrict free water.   11. Sepsis - H/o strep bacteremia 11/2020.  ICD extracted 12/04/20. Barostim was not removed as it is extravascular.  - Received IV antibiotics until 01/15/21. Blood cultures negative 01/30/21..  - 10/26 -Blood CX x2  Negative  - Ct chest concerning for pneumonia. 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 Hemorrhagic Pericarditis - Fibrin clot encasing heart at time of surgery.  Will need to be careful with anticoagulation (go slowly) - PharmD managing .   13. 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 - Ceftriaxone 2 g IV daily x 6 weeks. End Date: May 22, 2021  14. Constipation - Resolved.    15. Bilateral pleural effusions - S/p Left thoracentesis 12/8 but still with residual fluid. Will likely need repeat L tap in a few days - Plan Rt thoracentesis today   HHPT/HHRN ordered. Home antibiotics. Carolynn Sayers aware.    Length of Stay: [id number] PA-C  04/20/2021, 8:59 AM  Advanced Heart Failure Team Pager [phone] (M-F; 7a - 5p)  Please contact [facility] for night-coverage after hours (5p -7a ) and weekends on [email or web address]  Patient seen and examined with the above-signed Advanced Practice Provider and/or Housestaff. I personally reviewed laboratory data, imaging studies and relevant notes. I independently examined the patient and formulated the important aspects of the plan. I have edited the note to reflect any of my changes or salient points. I have personally discussed the plan with the patient and/or family.  Breathing better after bilateral thoracenteses. Diuresed well.  Co-ox improved to 57% Off milrinone. VAD interrogated personally. Parameters stable.  General:  NAD.  HEENT: normal  Neck:  supple. JVP not elevated.   Carotids 2+ bilat; no bruits. No lymphadenopathy or thryomegaly appreciated. Cor: LVAD hum.  Lungs: Clear. Decreased in bases Abdomen: obese soft, nontender, non-distended. No hepatosplenomegaly. No bruits or masses. Good bowel sounds. Driveline site clean. Anchor in place.  Extremities: no cyanosis, clubbing, rash. Warm 2-3+ edema above TED Neuro: alert & oriented x 3. No focal deficits. Moves all 4 without problem   Improved after bilateral thoracentesis. Still with some residual on left. VAD interrogated personally. Parameters stable. INR 1.7. Continue low-dose warfarin. Continue ambulation. Co-ox good. Continue torsemide as he is still a few pounds above pre-op weight.  Would repeat CXR on Monday. If still with significant effusion repeat left thoracentesis.   Glori Bickers, MD  4:08 PM

## 2021-04-20 NOTE — Procedures (Signed)
PROCEDURE SUMMARY:  Successful US guided right thoracentesis. Yielded 1.1 L of light red fluid. Pt tolerated procedure well. No immediate complications.  Specimen sent for labs. CXR ordered; no post-procedure pneumothorax identified  EBL < 2 mL  Theresa Duty, NP 04/20/2021 10:42 AM

## 2021-04-21 DIAGNOSIS — I5043 Acute on chronic combined systolic (congestive) and diastolic (congestive) heart failure: Secondary | ICD-10-CM

## 2021-04-21 LAB — GLUCOSE, CAPILLARY
Glucose-Capillary: 81 mg/dL (ref 70–99)
Glucose-Capillary: 149 mg/dL — ABNORMAL HIGH (ref 70–99)
Glucose-Capillary: 96 mg/dL (ref 70–99)
Glucose-Capillary: 134 mg/dL — ABNORMAL HIGH (ref 70–99)

## 2021-04-21 LAB — BASIC METABOLIC PANEL
Anion gap: 8 (ref 5–15)
BUN: 15 mg/dL (ref 8–23)
CO2: 33 mmol/L — ABNORMAL HIGH (ref 22–32)
Calcium: 8.1 mg/dL — ABNORMAL LOW (ref 8.9–10.3)
Chloride: 88 mmol/L — ABNORMAL LOW (ref 98–111)
Creatinine, Ser: 0.61 mg/dL (ref 0.61–1.24)
GFR, Estimated: >60 mL/min (ref >60)
Glucose, Bld: 148 mg/dL — ABNORMAL HIGH (ref 70–99)
Potassium: 3.4 mmol/L — ABNORMAL LOW (ref 3.5–5.1)
Sodium: 129 mmol/L — ABNORMAL LOW (ref 135–145)

## 2021-04-21 LAB — PROTIME-INR
INR: 2 — ABNORMAL HIGH (ref 0.8–1.2)
Prothrombin Time: 22.7 seconds — ABNORMAL HIGH (ref 11.4–15.2)

## 2021-04-21 LAB — COOXEMETRY PANEL
Carboxyhemoglobin: 2.4 % — ABNORMAL HIGH (ref 0.5–1.5)
Methemoglobin: 0.8 % (ref 0.0–1.5)
O2 Saturation: 63.7 %
Total hemoglobin: 8.1 g/dL — ABNORMAL LOW (ref 12.0–16.0)

## 2021-04-21 LAB — CBC
HCT: 25.1 % — ABNORMAL LOW (ref 39.0–52.0)
Hemoglobin: 7.5 g/dL — ABNORMAL LOW (ref 13.0–17.0)
MCH: 24 pg — ABNORMAL LOW (ref 26.0–34.0)
MCHC: 29.9 g/dL — ABNORMAL LOW (ref 30.0–36.0)
MCV: 80.2 fL (ref 80.0–100.0)
Platelets: 421 10*3/uL — ABNORMAL HIGH (ref 150–400)
RBC: 3.13 MIL/uL — ABNORMAL LOW (ref 4.22–5.81)
RDW: 19 % — ABNORMAL HIGH (ref 11.5–15.5)
WBC: 11.9 10*3/uL — ABNORMAL HIGH (ref 4.0–10.5)
nRBC: 0 % (ref 0.0–0.2)

## 2021-04-21 LAB — LACTATE DEHYDROGENASE: LDH: 130 U/L (ref 98–192)

## 2021-04-21 LAB — PREPARE RBC (CROSSMATCH)

## 2021-04-21 MED ORDER — WARFARIN 0.5 MG HALF TABLET
0.5000 mg | ORAL_TABLET | Freq: Once | ORAL | Status: AC
  Administered 2021-04-21: 0.5 mg via ORAL
  Filled 2021-04-21: qty 1

## 2021-04-21 MED ORDER — MIDODRINE HCL 5 MG PO TABS
10.0000 mg | ORAL_TABLET | Freq: Three times a day (TID) | ORAL | Status: DC
  Administered 2021-04-21 – 2021-04-24 (×9): 10 mg via ORAL
  Filled 2021-04-21 (×9): qty 2

## 2021-04-21 MED ORDER — SODIUM CHLORIDE 0.9% IV SOLUTION: Freq: Once | INTRAVENOUS | Status: AC

## 2021-04-21 NOTE — Progress Notes (Signed)
Patient ID: Brandon [name], male   DOB: Sep 29, 1955, 65 y.o.   MRN: [id number]      Advanced Heart Failure Rounding Note  PCP-Cardiologist: [name], MD  [facility]: Dr. [name]   Subjective:    Admitted with sepsis. Started on cefepime + vanc, now off abx (ID following). Afebrile.  10/29 Co-ox 48%, started on milrinone 0.25.  10/30 IV Antibiotics stopped. Blood cultures no growth for 5 days.  10/31 started on Augmentin for PNA.  11/1 Milrinone off 11/2 Milrinone added back. Midodrine increased to 10 mg TID 11/10 Developed Afib w/ RVR, converted with IV amio 11/14 Milrinone increased to 0.25 mcg. Diuresed with IV lasix  11/25 Underwent HM-3 VAD placement and bioprosthetic AVR. Had persistent hypotension in OR requiring methylene blue and high dose pressors.  11/26 Extubated 11/28 Started on lasix gtt>>later stopped due to low CVP (1) and increasing pressor requirements. Restarted on scheduled BID IV Lasix once pressure stabilized (still volume up)  11/28 Surgical path with evidence of possible "acute AoV endocarditis". ID consulted. Bcx recollected. Tissue sent to [facility] in [place].   11/30 Started on IV ceftriaxone to cover the Streptococcus gordonae that was isolated before, per ID. 11/30 VAD speed increased to 5600 12/1 Diuresed with IV lasix + metolazone. Milrinone was stopped. Amio drip transitioned to amio 200 mg twice a day.  12/5 Ramp Echo speed increased to 5500. Epi stopped.  12/6 Transferred to [place]. Had BM. Started on spiro 12.5 mg daily 12/8 CXR with significant bilateral pleural effusions. S/p Left Thoracentesis w/ 1.7 L out but still with residual fluid 12/9 Rt Thoracentesis   Co-ox better today, 64%. CVP 4-5   Breathing improved post right thoracentesis.   No complaints, ready for walk.   LVAD Interrogation HM 3: Speed: 5800 Flow: 5.1 PI: 4.2 Power: 4.8. Parameters stable.    Objective:   Weight Range: 79.2 kg Body mass index is 23.68 kg/m.   Vital  Signs:   Temp:  [97.9 F (36.6 C)-98.5 F (36.9 C)] 98.3 F (36.8 C) (12/10 0800) Pulse Rate:  [82-99] 88 (12/10 0800) Resp:  [15-20] 20 (12/10 0800) BP: (84-104)/(52-93) 99/81 (12/10 0800) SpO2:  [91 %-100 %] 94 % (12/10 0800) Weight:  [79.2 kg] 79.2 kg (12/10 0651) Last BM Date: 04/19/21  Weight change: Filed Weights   04/19/21 0506 04/20/21 0502 04/21/21 0651  Weight: 79.9 kg 79.3 kg 79.2 kg    Intake/Output:   Intake/Output Summary (Last 24 hours) at 04/21/2021 0855 Last data filed at 04/21/2021 0600 Gross per 24 hour  Intake 880 ml  Output 1075 ml  Net -195 ml     Physical Exam   General: Well appearing this am. NAD.  HEENT: Normal. Neck: Supple, JVP 7-8 cm. Carotids OK.  Cardiac:  Mechanical heart sounds with LVAD hum present.  Lungs:  Decreased BS left base.   Abdomen:  NT, ND, no HSM. No bruits or masses. +BS  LVAD exit site: Well-healed and incorporated. Dressing dry and intact. No erythema or drainage. Stabilization device present and accurately applied. Driveline dressing changed daily per sterile technique. Extremities:  Warm and dry. No cyanosis, clubbing, rash. 2+ edema 1/2 to knees with unna boots.  Neuro:  Alert & oriented x 3. Cranial nerves grossly intact. Moves all 4 extremities w/o difficulty. Affect pleasant       Telemetry   NSR 70s  (personally reviewed)  Labs    CBC Recent Labs    04/20/21 0500 04/21/21 0355  WBC 12.5* 11.9*  HGB 8.3* 7.5*  HCT 27.8* 25.1*  MCV 82.0 80.2  PLT 483* 062*   Basic Metabolic Panel Recent Labs    04/19/21 0535 04/20/21 0500  NA 133* 130*  K 3.5 4.0  CL 89* 90*  CO2 34* 33*  GLUCOSE 99 98  BUN 17 18  CREATININE 0.72 0.66  CALCIUM 8.6* 8.6*  MG  --  1.8   Liver Function Tests No results for input(s): AST, ALT, ALKPHOS, BILITOT, PROT, ALBUMIN in the last 72 hours.    No results for input(s): LIPASE, AMYLASE in the last 72 hours. Cardiac Enzymes No results for input(s): CKTOTAL, CKMB,  CKMBINDEX, TROPONINI in the last 72 hours.  BNP: BNP (last 3 results) Recent Labs    04/07/21 0500 04/13/21 0333 04/20/21 0600  BNP 663.2* 438.2* 193.0*    ProBNP (last 3 results) No results for input(s): PROBNP in the last 8760 hours.    D-Dimer No results for input(s): DDIMER in the last 72 hours. Hemoglobin A1C No results for input(s): HGBA1C in the last 72 hours.  Fasting Lipid Panel No results for input(s): CHOL, HDL, LDLCALC, TRIG, CHOLHDL, LDLDIRECT in the last 72 hours. Thyroid Function Tests No results for input(s): TSH, T4TOTAL, T3FREE, THYROIDAB in the last 72 hours.  Invalid input(s): FREET3  Other results:   Imaging    DG Chest 1 View  Result Date: 04/20/2021 CLINICAL DATA:  Post right thoracentesis EXAM: CHEST  1 VIEW COMPARISON:  04/19/2021 FINDINGS: Decreased right pleural effusion.  No pneumothorax. Left ventricular assist device. Prior median sternotomy. Moderate left pleural effusion appears to have progressed. Progressive left lower lobe atelectasis. Right arm PICC tip in the SVC. Electrical stimulator device with lead extending into the right neck unchanged. IMPRESSION: Decreased right pleural effusion.  No pneumothorax Progressive left pleural effusion and left lower lobe airspace disease. Electronically Signed   By: Franchot Gallo M.D.   On: 04/20/2021 10:21   IR THORACENTESIS ASP PLEURAL SPACE W/IMG GUIDE  Result Date: 04/20/2021 INDICATION: Patient with left ventricular assist device presents today with pleural effusions. Interventional radiology asked to perform a diagnostic and therapeutic right thoracentesis. EXAM: ULTRASOUND GUIDED THORACENTESIS MEDICATIONS: 1% lidocaine 10 mL COMPLICATIONS: None immediate. PROCEDURE: An ultrasound guided thoracentesis was thoroughly discussed with the patient and questions answered. The benefits, risks, alternatives and complications were also discussed. The patient understands and wishes to proceed with the  procedure. Written consent was obtained. Ultrasound was performed to localize and mark an adequate pocket of fluid in the right chest. The area was then prepped and draped in the normal sterile fashion. 1% Lidocaine was used for local anesthesia. Under ultrasound guidance a 6 Fr Safe-T-Centesis catheter was introduced. Thoracentesis was performed. The catheter was removed and a dressing applied. FINDINGS: A total of approximately 1.1 L of light red fluid was removed. Samples were sent to the laboratory as requested by the clinical team. IMPRESSION: Successful ultrasound guided right thoracentesis yielding 1.1 L of pleural fluid. Read by: Soyla Dryer, NP Electronically Signed   By: Miachel Roux M.D.   On: 04/20/2021 11:27     Medications:     Scheduled Medications:  (feeding supplement) PROSource Plus  30 mL Oral TID PC & HS   sodium chloride   Intravenous Once   sodium chloride   Intravenous Once   amiodarone  200 mg Oral BID   aspirin EC  81 mg Oral Daily   Chlorhexidine Gluconate Cloth  6 each Topical Daily   digoxin  0.125  mg Oral Daily   docusate sodium  200 mg Oral Daily   doxycycline  100 mg Oral Q12H   eplerenone  25 mg Oral Daily   ezetimibe  10 mg Oral Daily   feeding supplement  237 mL Oral TID BM   folic acid  1 mg Oral Daily   gabapentin  200 mg Oral TID   insulin aspart  0-15 Units Subcutaneous TID WC   insulin detemir  15 Units Subcutaneous Daily   mouth rinse  15 mL Mouth Rinse BID   melatonin  5 mg Oral QHS   midodrine  15 mg Oral TID WC   multivitamin with minerals  1 tablet Oral Daily   pantoprazole  40 mg Oral Daily   polyethylene glycol  17 g Oral Daily   sertraline  100 mg Oral Daily   sodium chloride flush  10-40 mL Intracatheter Q12H   sodium chloride flush  10-40 mL Intracatheter Q12H   sodium chloride flush  3 mL Intravenous Q12H   tamsulosin  0.4 mg Oral Daily   torsemide  40 mg Oral Daily   vitamin B-12  100 mcg Oral Daily   warfarin  0.5 mg Oral  ONCE-1600   Warfarin - Pharmacist Dosing Inpatient   Does not apply q1600    Infusions:  sodium chloride Stopped (04/07/21 0943)   sodium chloride     sodium chloride Stopped (04/09/21 2237)   sodium chloride Stopped (04/14/21 1149)   cefTRIAXone (ROCEPHIN)  IV 200 mL/hr at 04/20/21 2131   lactated ringers Stopped (04/13/21 1053)     PRN Medications: sodium chloride, sodium chloride, bisacodyl **OR** bisacodyl, lidocaine, morphine injection, ondansetron (ZOFRAN) IV, oxyCODONE, polyvinyl alcohol, sodium chloride flush, sodium chloride flush, traMADol    Patient Profile   Mr Mathew is a 65 year old with history of smoker, CAD s/p previous MI, HTN, HL, chronic HFrEF, sepsis 11/2020 bacteremia.  Admitted with sepsis. Bld Cx - NGTD    Assessment/Plan   1. Acute on chronic systolic HFr EF -> cardiogenic shock - Echo 05/19/20 EF 20-25% RV mildly HK. moderate AS  Mean gradient 13 AVA 1.2 cm2 DI 0.30 - Persistent NYHA IV. Admitted with shock - s/p HM-III VAD + bioprosthetic AVR on 11/25 - RV looked good on TEE in OR - Off pressors. Co-ox 64% - Volume status ok CVP 4-5.  Weight still up from baseline, continue torsemide 40 mg daily.  - MAP 80s generally, will start to decrease midodrine (10 mg tid today).   - Continue torsemide 40 daily.  - Continue digoxin 0.125.  - Continue eplerenone 25 daily - BMET not back today, send now.   2. HM-3 LVAD implant - Plan as above - transfuse to keep hgb > 8.0, 1 unit PRBCs today.  - VAD interrogated personally. Parameters stable. - INR 2 Continue ASA 81 for 30 days. Stop ASA 05/14/21  - LDH remains stable at 130 - Had ramp ECHO speed increased to 5800. Rare PI events.   3. Acute Hypoxic Respiratory Failure  - Extubated 11/26, stable on 2L [place].    4. 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 CAD  s/p anterior STEMI (12/20). LHC showed  chronically occluded RCA (with L>>R collaterals) and thrombotic occlusion of proximal LAD. Underwent PCI of LAD. - No chest pain.  - Continue atorvastatin 80.   6. Iron-deficiency anemia & ABLA - Rec'd  Feraheme 11/8 and 11/13  - Keep hgb > 8.0, 7.5 today, will give 1 unit PRBCs.   7. Hypokalemia/ Hypomagnesemia  - Keep K > 4.0 Mg > 2.0   8. Severe protein-calorie malnutrition - prealbumin 11>15> 13 >6.6 - Continue aggressive nutritional support  9. Paroxysmal Atrial Fibrillation w/ RVR - new, developed 11/10. Maintaining SR/ST    - Off amio drip. Continue amio 200 mg twice a day.  - on warfarin. INR 2 today.   10. Hyponatremia - sodium 131>133>130  - Continue to restrict free water.   11. Sepsis - H/o strep bacteremia 11/2020.  ICD extracted 12/04/20. Barostim was not removed as it is extravascular.  - Received IV antibiotics until 01/15/21. Blood cultures negative 01/30/21..  - 10/26 -Blood CX x2  Negative  - Ct chest concerning for pneumonia. 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 Hemorrhagic Pericarditis - Fibrin clot encasing heart at time of surgery.  Will need to be careful with anticoagulation (go slowly) - PharmD managing.   29. 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  - Bcx recollected and pending  - Tissue sent to [facility] in [place] for broad 16 S ribosomal sequencing for bacterial pathogens as well as T wet Bhilai Bartonella Brucella  - ID following, recommending  IV ceftriaxone to cover the Streptococcus gordonae that was isolated before given possible acute AoV endocarditis on surgical path. - Ceftriaxone 2 g IV daily x 6  weeks. End Date: May 22, 2021  14. Constipation - Resolved.    15. Bilateral pleural effusions - S/p Left thoracentesis 12/8 but still with residual fluid. Will likely need repeat L tap in a few days - R thoracentesis 12/9.  - Repeat CXR on Monday to determine need for left thoracentesis again.   HHPT/HHRN ordered. Home antibiotics. Carolynn Sayers aware.    Length of Stay: [place], MD  04/21/2021, 8:55 AM  Advanced Heart Failure Team Pager [phone] (M-F; 7a - 5p)  Please contact [facility] for night-coverage after hours (5p -7a ) and weekends on [email or web address]

## 2021-04-21 NOTE — Plan of Care (Signed)
  Problem: Education: Goal: Knowledge of General Education information will improve Description: Including pain rating scale, medication(s)/side effects and non-pharmacologic comfort measures Outcome: Progressing   Problem: Health Behavior/Discharge Planning: Goal: Ability to manage health-related needs will improve Outcome: Progressing   Problem: Clinical Measurements: Goal: Ability to maintain clinical measurements within normal limits will improve Outcome: Progressing Goal: Will remain free from infection Outcome: Progressing Goal: Diagnostic test results will improve Outcome: Progressing Goal: Respiratory complications will improve Outcome: Progressing Goal: Cardiovascular complication will be avoided Outcome: Progressing   Problem: Activity: Goal: Risk for activity intolerance will decrease Outcome: Progressing   Problem: Nutrition: Goal: Adequate nutrition will be maintained Outcome: Progressing   Problem: Coping: Goal: Level of anxiety will decrease Outcome: Progressing   Problem: Elimination: Goal: Will not experience complications related to bowel motility Outcome: Progressing Goal: Will not experience complications related to urinary retention Outcome: Progressing   Problem: Pain Managment: Goal: General experience of comfort will improve Outcome: Progressing   Problem: Safety: Goal: Ability to remain free from injury will improve Outcome: Progressing   Problem: Skin Integrity: Goal: Risk for impaired skin integrity will decrease Outcome: Progressing   Problem: Education: Goal: Ability to demonstrate management of disease process will improve Outcome: Progressing Goal: Ability to verbalize understanding of medication therapies will improve Outcome: Progressing Goal: Individualized Educational Video(s) Outcome: Progressing   Problem: Activity: Goal: Capacity to carry out activities will improve Outcome: Progressing   Problem: Cardiac: Goal:  Ability to achieve and maintain adequate cardiopulmonary perfusion will improve Outcome: Progressing   Problem: Education: Goal: Knowledge of the prescribed therapeutic regimen will improve Outcome: Progressing   Problem: Activity: Goal: Risk for activity intolerance will decrease Outcome: Progressing   Problem: Cardiac: Goal: Ability to maintain an adequate cardiac output will improve Outcome: Progressing   Problem: Coping: Goal: Level of anxiety will decrease Outcome: Progressing   Problem: Fluid Volume: Goal: Risk for excess fluid volume will decrease Outcome: Progressing   Problem: Clinical Measurements: Goal: Ability to maintain clinical measurements within normal limits will improve Outcome: Progressing Goal: Will remain free from infection Outcome: Progressing   Problem: Respiratory: Goal: Will regain and/or maintain adequate ventilation Outcome: Progressing

## 2021-04-21 NOTE — Plan of Care (Signed)
Problem: Education: Goal: Knowledge of General Education information will improve Description: Including pain rating scale, medication(s)/side effects and non-pharmacologic comfort measures 04/21/2021 0054 by Shanon Ace, RN Outcome: Progressing 04/21/2021 0054 by Shanon Ace, RN Outcome: Progressing   Problem: Health Behavior/Discharge Planning: Goal: Ability to manage health-related needs will improve 04/21/2021 0054 by Shanon Ace, RN Outcome: Progressing 04/21/2021 0054 by Shanon Ace, RN Outcome: Progressing   Problem: Clinical Measurements: Goal: Ability to maintain clinical measurements within normal limits will improve 04/21/2021 0054 by Shanon Ace, RN Outcome: Progressing 04/21/2021 0054 by Shanon Ace, RN Outcome: Progressing Goal: Will remain free from infection 04/21/2021 0054 by Shanon Ace, RN Outcome: Progressing 04/21/2021 0054 by Shanon Ace, RN Outcome: Progressing Goal: Diagnostic test results will improve 04/21/2021 0054 by Shanon Ace, RN Outcome: Progressing 04/21/2021 0054 by Shanon Ace, RN Outcome: Progressing Goal: Respiratory complications will improve 04/21/2021 0054 by Shanon Ace, RN Outcome: Progressing 04/21/2021 0054 by Shanon Ace, RN Outcome: Progressing Goal: Cardiovascular complication will be avoided 04/21/2021 0054 by Shanon Ace, RN Outcome: Progressing 04/21/2021 0054 by Shanon Ace, RN Outcome: Progressing   Problem: Activity: Goal: Risk for activity intolerance will decrease 04/21/2021 0054 by Shanon Ace, RN Outcome: Progressing 04/21/2021 0054 by Shanon Ace, RN Outcome: Progressing   Problem: Nutrition: Goal: Adequate nutrition will be maintained 04/21/2021 0054 by Shanon Ace, RN Outcome: Progressing 04/21/2021 0054 by Shanon Ace, RN Outcome: Progressing   Problem: Coping: Goal: Level of anxiety will decrease 04/21/2021 0054 by Shanon Ace, RN Outcome:  Progressing 04/21/2021 0054 by Shanon Ace, RN Outcome: Progressing   Problem: Elimination: Goal: Will not experience complications related to bowel motility 04/21/2021 0054 by Shanon Ace, RN Outcome: Progressing 04/21/2021 0054 by Shanon Ace, RN Outcome: Progressing Goal: Will not experience complications related to urinary retention 04/21/2021 0054 by Shanon Ace, RN Outcome: Progressing 04/21/2021 0054 by Shanon Ace, RN Outcome: Progressing   Problem: Pain Managment: Goal: General experience of comfort will improve 04/21/2021 0054 by Shanon Ace, RN Outcome: Progressing 04/21/2021 0054 by Shanon Ace, RN Outcome: Progressing   Problem: Safety: Goal: Ability to remain free from injury will improve 04/21/2021 0054 by Shanon Ace, RN Outcome: Progressing 04/21/2021 0054 by Shanon Ace, RN Outcome: Progressing   Problem: Skin Integrity: Goal: Risk for impaired skin integrity will decrease 04/21/2021 0054 by Shanon Ace, RN Outcome: Progressing 04/21/2021 0054 by Shanon Ace, RN Outcome: Progressing   Problem: Education: Goal: Ability to demonstrate management of disease process will improve 04/21/2021 0054 by Shanon Ace, RN Outcome: Progressing 04/21/2021 0054 by Shanon Ace, RN Outcome: Progressing Goal: Ability to verbalize understanding of medication therapies will improve 04/21/2021 0054 by Shanon Ace, RN Outcome: Progressing 04/21/2021 0054 by Shanon Ace, RN Outcome: Progressing Goal: Individualized Educational Video(s) 04/21/2021 0054 by Shanon Ace, RN Outcome: Progressing 04/21/2021 0054 by Shanon Ace, RN Outcome: Progressing   Problem: Activity: Goal: Capacity to carry out activities will improve 04/21/2021 0054 by Shanon Ace, RN Outcome: Progressing 04/21/2021 0054 by Shanon Ace, RN Outcome: Progressing   Problem: Cardiac: Goal: Ability to achieve and maintain adequate cardiopulmonary perfusion will  improve 04/21/2021 0054 by Shanon Ace, RN Outcome: Progressing 04/21/2021 0054 by Shanon Ace, RN Outcome: Progressing   Problem: Education: Goal: Knowledge of the prescribed therapeutic regimen will improve 04/21/2021 0054 by Shanon Ace, RN Outcome: Progressing 04/21/2021 0054 by Shanon Ace, RN Outcome: Progressing   Problem: Activity: Goal: Risk for activity intolerance will decrease 04/21/2021 0054 by Shanon Ace, RN Outcome: Progressing 04/21/2021 0054 by Shanon Ace, RN Outcome: Progressing  Problem: Cardiac: Goal: Ability to maintain an adequate cardiac output will improve 04/21/2021 0054 by Shanon Ace, RN Outcome: Progressing 04/21/2021 0054 by Shanon Ace, RN Outcome: Progressing   Problem: Coping: Goal: Level of anxiety will decrease 04/21/2021 0054 by Shanon Ace, RN Outcome: Progressing 04/21/2021 0054 by Shanon Ace, RN Outcome: Progressing   Problem: Fluid Volume: Goal: Risk for excess fluid volume will decrease 04/21/2021 0054 by Shanon Ace, RN Outcome: Progressing 04/21/2021 0054 by Shanon Ace, RN Outcome: Progressing   Problem: Clinical Measurements: Goal: Ability to maintain clinical measurements within normal limits will improve 04/21/2021 0054 by Shanon Ace, RN Outcome: Progressing 04/21/2021 0054 by Shanon Ace, RN Outcome: Progressing Goal: Will remain free from infection 04/21/2021 0054 by Shanon Ace, RN Outcome: Progressing 04/21/2021 0054 by Shanon Ace, RN Outcome: Progressing   Problem: Respiratory: Goal: Will regain and/or maintain adequate ventilation 04/21/2021 0054 by Shanon Ace, RN Outcome: Progressing 04/21/2021 0054 by Shanon Ace, RN Outcome: Progressing

## 2021-04-21 NOTE — Progress Notes (Signed)
ANTICOAGULATION CONSULT NOTE   Pharmacy Consult for warfarin Indication:  s/p VAD (HM3) , afib, hx DVT  No Active Allergies  Patient Measurements: Height: 6' (182.9 cm) Weight: 79.2 kg (174 lb 9.7 oz) IBW/kg (Calculated) : 77.6   Vital Signs: Temp: 98.3 F (36.8 C) (12/10 0405) Temp Source: Oral (12/10 0405) BP: 84/73 (12/10 0405) Pulse Rate: 82 (12/10 0405)  Labs: Recent Labs    04/19/21 0535 04/20/21 0500 04/21/21 0355  HGB 8.2* 8.3* 7.5*  HCT 27.9* 27.8* 25.1*  PLT 506* 483* 421*  LABPROT 21.2* 20.2* 22.7*  INR 1.8* 1.7* 2.0*  CREATININE 0.72 0.66  --      Estimated Creatinine Clearance: 98.3 mL/min (by C-G formula based on SCr of 0.66 mg/dL).   Medical History: Past Medical History:  Diagnosis Date   AICD (automatic cardioverter/defibrillator) present 08/31/2019   Ankylosing spondylitis ([place])    Arthritis    BENIGN PROSTATIC HYPERTROPHY 06/07/2008   CHF (congestive heart failure) ([place])    COLONIC POLYPS, HX OF 06/07/2008   Depression    H/O hiatal hernia    Heart failure ([place])    HYPERLIPIDEMIA 06/07/2008   HYPERTENSION 06/07/2008   Myocardial infarction [facility]) 2005   NSTEMI, s/p LAD stent   NEPHROLITHIASIS, HX OF 06/07/2008   STEMI (ST elevation myocardial infarction) ([place]) 04/27/2019    Assessment: 65 yo M s/p HM 3 and AVR on 04/06/21. Prior to surgery/admit patient was on apixiban for a DVT.  INR now therapeutic at 2, Hgb low 7.5 and LDH stable 160. Pt s/p thora x 2 with possible plans for repeat on Monday. HF team requesting not to push INR up until clear from procedures and continue low dose. Have been giving 1mg /day, will decrease given rise overnight.    Can drop ASA 81mg  x 30 days post implant, 12/25.   Goal of Therapy:  Warfarin INR 2.0-2.5 Monitor platelets by anticoagulation protocol: Yes   Plan:  Warfarin 0.5 mg PO x1 tonight Daily INR  Erin Hearing PharmD., BCPS Clinical Pharmacist 04/21/2021 7:41 AM

## 2021-04-21 NOTE — Progress Notes (Signed)
CARDIAC REHAB PHASE I   PRE:  Rate/Rhythm: 88 SR    BP: sitting 90/74    SaO2: 95 2L  MODE:  Ambulation: 300 ft   POST:  Rate/Rhythm: 96     BP: sitting      SaO2: wouldn't register  Pt motivated to walk. RN helped with donning batteries. Stood with min assist with gait belt. Ambulated with RW and rollator follow. C/o burning "lung"pain after 150 ft and asked to sit to rest. Rest x61min then stood with min assist with gait belt. Able to ambulate back to recliner. Overall tolerated well. Changed back to power with verbal cues as he gets fast or distracted.  [place], ACSM 04/21/2021 12:32 PM

## 2021-04-22 LAB — CBC
HCT: 28.5 % — ABNORMAL LOW (ref 39.0–52.0)
Hemoglobin: 8.9 g/dL — ABNORMAL LOW (ref 13.0–17.0)
MCH: 24.8 pg — ABNORMAL LOW (ref 26.0–34.0)
MCHC: 31.2 g/dL (ref 30.0–36.0)
MCV: 79.4 fL — ABNORMAL LOW (ref 80.0–100.0)
Platelets: 444 10*3/uL — ABNORMAL HIGH (ref 150–400)
RBC: 3.59 MIL/uL — ABNORMAL LOW (ref 4.22–5.81)
RDW: 18.5 % — ABNORMAL HIGH (ref 11.5–15.5)
WBC: 11 10*3/uL — ABNORMAL HIGH (ref 4.0–10.5)
nRBC: 0 % (ref 0.0–0.2)

## 2021-04-22 LAB — BASIC METABOLIC PANEL
Anion gap: 6 (ref 5–15)
Anion gap: 8 (ref 5–15)
BUN: 15 mg/dL (ref 8–23)
BUN: 13 mg/dL (ref 8–23)
CO2: 35 mmol/L — ABNORMAL HIGH (ref 22–32)
CO2: 35 mmol/L — ABNORMAL HIGH (ref 22–32)
Calcium: 8.1 mg/dL — ABNORMAL LOW (ref 8.9–10.3)
Calcium: 8.5 mg/dL — ABNORMAL LOW (ref 8.9–10.3)
Chloride: 84 mmol/L — ABNORMAL LOW (ref 98–111)
Chloride: 92 mmol/L — ABNORMAL LOW (ref 98–111)
Creatinine, Ser: 0.63 mg/dL (ref 0.61–1.24)
Creatinine, Ser: 0.6 mg/dL — ABNORMAL LOW (ref 0.61–1.24)
GFR, Estimated: >60 mL/min (ref >60)
GFR, Estimated: >60 mL/min (ref >60)
Glucose, Bld: 99 mg/dL (ref 70–99)
Glucose, Bld: 94 mg/dL (ref 70–99)
Potassium: 3.4 mmol/L — ABNORMAL LOW (ref 3.5–5.1)
Potassium: 3.6 mmol/L (ref 3.5–5.1)
Sodium: 125 mmol/L — ABNORMAL LOW (ref 135–145)
Sodium: 135 mmol/L (ref 135–145)

## 2021-04-22 LAB — GLUCOSE, CAPILLARY
Glucose-Capillary: 95 mg/dL (ref 70–99)
Glucose-Capillary: 134 mg/dL — ABNORMAL HIGH (ref 70–99)
Glucose-Capillary: 135 mg/dL — ABNORMAL HIGH (ref 70–99)
Glucose-Capillary: 60 mg/dL — ABNORMAL LOW (ref 70–99)
Glucose-Capillary: 149 mg/dL — ABNORMAL HIGH (ref 70–99)
Glucose-Capillary: 73 mg/dL (ref 70–99)

## 2021-04-22 LAB — LACTATE DEHYDROGENASE: LDH: 156 U/L (ref 98–192)

## 2021-04-22 LAB — COOXEMETRY PANEL
Carboxyhemoglobin: 2.2 % — ABNORMAL HIGH (ref 0.5–1.5)
Methemoglobin: 0.7 % (ref 0.0–1.5)
O2 Saturation: 65.7 %
Total hemoglobin: 9.1 g/dL — ABNORMAL LOW (ref 12.0–16.0)

## 2021-04-22 LAB — TYPE AND SCREEN
ABO/RH(D): O NEG
Antibody Screen: NEGATIVE
Unit division: 0

## 2021-04-22 LAB — BPAM RBC
Blood Product Expiration Date: 202212192359
ISSUE DATE / TIME: 202212101306
Unit Type and Rh: 9500

## 2021-04-22 LAB — PROTIME-INR
INR: 1.5 — ABNORMAL HIGH (ref 0.8–1.2)
Prothrombin Time: 17.9 seconds — ABNORMAL HIGH (ref 11.4–15.2)

## 2021-04-22 MED ORDER — POTASSIUM CHLORIDE CRYS ER 20 MEQ PO TBCR
40.0000 meq | EXTENDED_RELEASE_TABLET | Freq: Three times a day (TID) | ORAL | Status: AC
  Administered 2021-04-22 (×3): 40 meq via ORAL
  Filled 2021-04-22 (×3): qty 2

## 2021-04-22 MED ORDER — WARFARIN SODIUM 2.5 MG PO TABS
2.5000 mg | ORAL_TABLET | Freq: Once | ORAL | Status: AC
  Administered 2021-04-22: 2.5 mg via ORAL
  Filled 2021-04-22: qty 1

## 2021-04-22 MED ORDER — FUROSEMIDE 10 MG/ML IJ SOLN
80.0000 mg | Freq: Two times a day (BID) | INTRAMUSCULAR | Status: AC
  Administered 2021-04-22 – 2021-04-23 (×4): 80 mg via INTRAVENOUS
  Filled 2021-04-22 (×4): qty 8

## 2021-04-22 MED ORDER — AMIODARONE HCL 200 MG PO TABS
200.0000 mg | ORAL_TABLET | Freq: Every day | ORAL | Status: DC
  Administered 2021-04-22 – 2021-05-09 (×18): 200 mg via ORAL
  Filled 2021-04-22 (×19): qty 1

## 2021-04-22 MED ORDER — TOLVAPTAN 15 MG PO TABS
15.0000 mg | ORAL_TABLET | Freq: Once | ORAL | Status: AC
  Administered 2021-04-22: 15 mg via ORAL
  Filled 2021-04-22: qty 1

## 2021-04-22 NOTE — Progress Notes (Addendum)
ANTICOAGULATION CONSULT NOTE   Pharmacy Consult for warfarin Indication:  s/p VAD (HM3) , afib, hx DVT  No Active Allergies  Patient Measurements: Height: 6' (182.9 cm) Weight: 81.6 kg (179 lb 14.4 oz) IBW/kg (Calculated) : 77.6   Vital Signs: Temp: 97.6 F (36.4 C) (12/11 0417) Temp Source: Oral (12/11 0417) BP: 86/62 (12/11 0744) Pulse Rate: 72 (12/10 2356)  Labs: Recent Labs    04/20/21 0500 04/21/21 0355 04/21/21 1128 04/22/21 0450  HGB 8.3* 7.5*  --  8.9*  HCT 27.8* 25.1*  --  28.5*  PLT 483* 421*  --  444*  LABPROT 20.2* 22.7*  --  17.9*  INR 1.7* 2.0*  --  1.5*  CREATININE 0.66  --  0.61 0.63     Estimated Creatinine Clearance: 98.3 mL/min (by C-G formula based on SCr of 0.63 mg/dL).   Medical History: Past Medical History:  Diagnosis Date   AICD (automatic cardioverter/defibrillator) present 08/31/2019   Ankylosing spondylitis ([place])    Arthritis    BENIGN PROSTATIC HYPERTROPHY 06/07/2008   CHF (congestive heart failure) ([place])    COLONIC POLYPS, HX OF 06/07/2008   Depression    H/O hiatal hernia    Heart failure ([place])    HYPERLIPIDEMIA 06/07/2008   HYPERTENSION 06/07/2008   Myocardial infarction [facility]) 2005   NSTEMI, s/p LAD stent   NEPHROLITHIASIS, HX OF 06/07/2008   STEMI (ST elevation myocardial infarction) ([place]) 04/27/2019    Assessment: 65 yo M s/p HM 3 and AVR on 04/06/21. Prior to surgery/admit patient was on apixiban for a DVT. INR down today to 1.5, Hgb improved 8.9 after transfusion yesterday. LDH stable 150s. Pt s/p thora x 2 with possible plans for repeat on Monday. HF team noting if INR continues to drop he will need heparin bridge. Will give higher dose warfarin tonight.    Can drop ASA 81mg  x 30 days post implant, 12/25.   Goal of Therapy:  Warfarin INR 2.0-2.5 Monitor platelets by anticoagulation protocol: Yes   Plan:  Warfarin 2.5 mg PO x1 tonight Daily INR  Erin Hearing PharmD., BCPS Clinical Pharmacist 04/22/2021 9:43  AM

## 2021-04-22 NOTE — Plan of Care (Signed)
  Problem: Education: Goal: Knowledge of General Education information will improve Description: Including pain rating scale, medication(s)/side effects and non-pharmacologic comfort measures Outcome: Progressing   Problem: Health Behavior/Discharge Planning: Goal: Ability to manage health-related needs will improve Outcome: Progressing   Problem: Clinical Measurements: Goal: Ability to maintain clinical measurements within normal limits will improve Outcome: Progressing Goal: Will remain free from infection Outcome: Progressing Goal: Diagnostic test results will improve Outcome: Progressing Goal: Respiratory complications will improve Outcome: Progressing Goal: Cardiovascular complication will be avoided Outcome: Progressing   Problem: Activity: Goal: Risk for activity intolerance will decrease Outcome: Progressing   Problem: Nutrition: Goal: Adequate nutrition will be maintained Outcome: Progressing   Problem: Coping: Goal: Level of anxiety will decrease Outcome: Progressing   Problem: Elimination: Goal: Will not experience complications related to bowel motility Outcome: Progressing Goal: Will not experience complications related to urinary retention Outcome: Progressing   Problem: Pain Managment: Goal: General experience of comfort will improve Outcome: Progressing   Problem: Safety: Goal: Ability to remain free from injury will improve Outcome: Progressing   Problem: Skin Integrity: Goal: Risk for impaired skin integrity will decrease Outcome: Progressing   Problem: Education: Goal: Ability to demonstrate management of disease process will improve Outcome: Progressing Goal: Ability to verbalize understanding of medication therapies will improve Outcome: Progressing Goal: Individualized Educational Video(s) Outcome: Progressing   Problem: Activity: Goal: Capacity to carry out activities will improve Outcome: Progressing   Problem: Cardiac: Goal:  Ability to achieve and maintain adequate cardiopulmonary perfusion will improve Outcome: Progressing   Problem: Education: Goal: Knowledge of the prescribed therapeutic regimen will improve Outcome: Progressing   Problem: Activity: Goal: Risk for activity intolerance will decrease Outcome: Progressing   Problem: Cardiac: Goal: Ability to maintain an adequate cardiac output will improve Outcome: Progressing   Problem: Coping: Goal: Level of anxiety will decrease Outcome: Progressing   Problem: Fluid Volume: Goal: Risk for excess fluid volume will decrease Outcome: Progressing   Problem: Clinical Measurements: Goal: Ability to maintain clinical measurements within normal limits will improve Outcome: Progressing Goal: Will remain free from infection Outcome: Progressing   Problem: Respiratory: Goal: Will regain and/or maintain adequate ventilation Outcome: Progressing

## 2021-04-22 NOTE — Progress Notes (Addendum)
Patient ID: Brandon [name], male   DOB: Nov 08, 1955, 65 y.o.   MRN: [id number]      Advanced Heart Failure Rounding Note  PCP-Cardiologist: [name], MD  [facility]: Dr. [name]   Subjective:    Admitted with sepsis. Started on cefepime + vanc, now off abx (ID following). Afebrile.  10/29 Co-ox 48%, started on milrinone 0.25.  10/30 IV Antibiotics stopped. Blood cultures no growth for 5 days.  10/31 started on Augmentin for PNA.  11/1 Milrinone off 11/2 Milrinone added back. Midodrine increased to 10 mg TID 11/10 Developed Afib w/ RVR, converted with IV amio 11/14 Milrinone increased to 0.25 mcg. Diuresed with IV lasix  11/25 Underwent HM-3 VAD placement and bioprosthetic AVR. Had persistent hypotension in OR requiring methylene blue and high dose pressors.  11/26 Extubated 11/28 Started on lasix gtt>>later stopped due to low CVP (1) and increasing pressor requirements. Restarted on scheduled BID IV Lasix once pressure stabilized (still volume up)  11/28 Surgical path with evidence of possible "acute AoV endocarditis". ID consulted. Bcx recollected. Tissue sent to [facility] in [place].   11/30 Started on IV ceftriaxone to cover the Streptococcus gordonae that was isolated before, per ID. 11/30 VAD speed increased to 5600 12/1 Diuresed with IV lasix + metolazone. Milrinone was stopped. Amio drip transitioned to amio 200 mg twice a day.  12/5 Ramp Echo speed increased to 5500. Epi stopped.  12/6 Transferred to [place]. Had BM. Started on spiro 12.5 mg daily 12/8 CXR with significant bilateral pleural effusions. S/p Left Thoracentesis w/ 1.7 L out but still with residual fluid 12/9 Rt Thoracentesis   Feels stronger today. Chest still slightly sore. No orthopnea or PND.   Got 1u RBCs yesterday. Weight still climbing, Serum sodium down to 125   LVAD Interrogation HM 3: Speed: 5800 Flow: 5.1 PI: 4.2 Power: 4.8. Parameters stable.    Objective:   Weight Range: 81.6 kg Body mass index is 24.4  kg/m.   Vital Signs:   Temp:  [97.5 F (36.4 C)-98.5 F (36.9 C)] 97.6 F (36.4 C) (12/11 0417) Pulse Rate:  [72-99] 72 (12/10 2356) Resp:  [16-21] 20 (12/11 0417) BP: (71-98)/(42-79) 86/62 (12/11 0744) SpO2:  [91 %-99 %] 98 % (12/11 0417) Weight:  [81.6 kg] 81.6 kg (12/11 0417) Last BM Date: 04/21/21  Weight change: Filed Weights   04/20/21 0502 04/21/21 0651 04/22/21 0417  Weight: 79.3 kg 79.2 kg 81.6 kg    Intake/Output:   Intake/Output Summary (Last 24 hours) at 04/22/2021 0917 Last data filed at 04/21/2021 2252 Gross per 24 hour  Intake 383.5 ml  Output 1975 ml  Net -1591.5 ml      Physical Exam   General:  NAD.  HEENT: normal  Neck: supple. JVP elevated.  Carotids 2+ bilat; no bruits. No lymphadenopathy or thryomegaly appreciated. Cor: LVAD hum.  Lungs: Dull at left base Crackles at R base Abdomen: obese soft, nontender, non-distended. No hepatosplenomegaly. No bruits or masses. Good bowel sounds. Driveline site clean. Anchor in place.  Extremities: no cyanosis, clubbing, rash. 2+ edema  above TED Neuro: alert & oriented x 3. No focal deficits. Moves all 4 without problem   Telemetry   NSR 70s (personally reviewed)  Labs    CBC Recent Labs    04/21/21 0355 04/22/21 0450  WBC 11.9* 11.0*  HGB 7.5* 8.9*  HCT 25.1* 28.5*  MCV 80.2 79.4*  PLT 421* 444*    Basic Metabolic Panel Recent Labs    04/20/21 0500 04/21/21  1128 04/22/21 0450  NA 130* 129* 125*  K 4.0 3.4* 3.4*  CL 90* 88* 84*  CO2 33* 33* 35*  GLUCOSE 98 148* 99  BUN 18 15 15   CREATININE 0.66 0.61 0.63  CALCIUM 8.6* 8.1* 8.1*  MG 1.8  --   --     Liver Function Tests No results for input(s): AST, ALT, ALKPHOS, BILITOT, PROT, ALBUMIN in the last 72 hours.    No results for input(s): LIPASE, AMYLASE in the last 72 hours. Cardiac Enzymes No results for input(s): CKTOTAL, CKMB, CKMBINDEX, TROPONINI in the last 72 hours.  BNP: BNP (last 3 results) Recent Labs     04/07/21 0500 04/13/21 0333 04/20/21 0600  BNP 663.2* 438.2* 193.0*     ProBNP (last 3 results) No results for input(s): PROBNP in the last 8760 hours.    D-Dimer No results for input(s): DDIMER in the last 72 hours. Hemoglobin A1C No results for input(s): HGBA1C in the last 72 hours.  Fasting Lipid Panel No results for input(s): CHOL, HDL, LDLCALC, TRIG, CHOLHDL, LDLDIRECT in the last 72 hours. Thyroid Function Tests No results for input(s): TSH, T4TOTAL, T3FREE, THYROIDAB in the last 72 hours.  Invalid input(s): FREET3  Other results:   Imaging    No results found.   Medications:     Scheduled Medications:  (feeding supplement) PROSource Plus  30 mL Oral TID PC & HS   sodium chloride   Intravenous Once   amiodarone  200 mg Oral BID   aspirin EC  81 mg Oral Daily   Chlorhexidine Gluconate Cloth  6 each Topical Daily   digoxin  0.125 mg Oral Daily   docusate sodium  200 mg Oral Daily   doxycycline  100 mg Oral Q12H   eplerenone  25 mg Oral Daily   ezetimibe  10 mg Oral Daily   feeding supplement  237 mL Oral TID BM   folic acid  1 mg Oral Daily   gabapentin  200 mg Oral TID   insulin aspart  0-15 Units Subcutaneous TID WC   insulin detemir  15 Units Subcutaneous Daily   mouth rinse  15 mL Mouth Rinse BID   melatonin  5 mg Oral QHS   midodrine  10 mg Oral TID WC   multivitamin with minerals  1 tablet Oral Daily   pantoprazole  40 mg Oral Daily   polyethylene glycol  17 g Oral Daily   sertraline  100 mg Oral Daily   sodium chloride flush  10-40 mL Intracatheter Q12H   sodium chloride flush  10-40 mL Intracatheter Q12H   sodium chloride flush  3 mL Intravenous Q12H   tamsulosin  0.4 mg Oral Daily   torsemide  40 mg Oral Daily   vitamin B-12  100 mcg Oral Daily   Warfarin - Pharmacist Dosing Inpatient   Does not apply q1600    Infusions:  sodium chloride Stopped (04/07/21 0943)   sodium chloride     sodium chloride Stopped (04/09/21 2237)   sodium  chloride Stopped (04/14/21 1149)   cefTRIAXone (ROCEPHIN)  IV 2 g (04/21/21 1045)   lactated ringers Stopped (04/13/21 1053)     PRN Medications: sodium chloride, sodium chloride, bisacodyl **OR** bisacodyl, lidocaine, morphine injection, ondansetron (ZOFRAN) IV, oxyCODONE, polyvinyl alcohol, sodium chloride flush, sodium chloride flush, traMADol    Patient Profile   Brandon [name] is a 65 year old with history of smoker, CAD s/p previous MI, HTN, HL, chronic HFrEF, sepsis 11/2020 bacteremia.  Admitted with sepsis. Bld Cx - NGTD    Assessment/Plan   1. Acute on chronic systolic HFr EF -> cardiogenic shock - Echo 05/19/20 EF 20-25% RV mildly HK. moderate AS  Mean gradient 13 AVA 1.2 cm2 DI 0.30 - Persistent NYHA IV. Admitted with shock - s/p HM-III VAD + bioprosthetic AVR on 11/25 - RV looked good on TEE in OR - Off pressors. Co-ox 66% - Volume status elevated. Give lasix 80 IV bid today - MAP 80s. On midodrine 10 tid (Decreased yesterday) - can wean as tolerated - Continue digoxin 0.125.  - Continue eplerenone 25 daily  2. HM-3 LVAD implant - Plan as above - VAD interrogated personally. Parameters stable. - INR 1.5 Continue ASA 81 for 30 days. Stop ASA 05/14/21  - If INR drops further will need low-dose heparin - LDH ok - Had ramp ECHO speed increased to 5800. Rare PI events.   3. Acute Hypoxic Respiratory Failure  - Extubated 11/26, stable on 2L [place].    4. 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 CAD  s/p anterior STEMI (12/20). LHC showed chronically occluded RCA (with L>>R collaterals) and thrombotic occlusion of proximal LAD. Underwent PCI of LAD. - No s/s angina - Continue atorvastatin 80.   6. Iron-deficiency anemia & ABLA - Rec'd Feraheme 11/8 and 11/13  - Keep hgb > 8.0, 7.5 today,  - Rec'd 1 unit PRBCs on 12/10 - MCV still low. Recheck iron stores  7. Hypokalemia/  Hypomagnesemia  - Keep K > 4.0 Mg > 2.0   8. Severe protein-calorie malnutrition - prealbumin 11>15> 13 >6.6 - Continue aggressive nutritional support  9. Paroxysmal Atrial Fibrillation w/ RVR - new, developed 11/10. Maintaining SR/ST    - Off amio drip. Continue amio 200 mg twice a day.  - on warfarin. INR 1.5 today.   10. Hyponatremia - sodium 131>133>130-> 125 - Continue to restrict free water. - Will give tolvaptan 15 today   11. Sepsis - H/o strep bacteremia 11/2020.  ICD extracted 12/04/20. Barostim was not removed as it is extravascular.  - Received IV antibiotics until 01/15/21. Blood cultures negative 01/30/21..  - 10/26 -Blood CX x2  Negative  - Ct chest concerning for pneumonia. 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 Hemorrhagic Pericarditis - Fibrin clot encasing heart at time of surgery.  Will need to be careful with anticoagulation (go slowly) - PharmD managing.   68. 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 - Ceftriaxone 2 g IV daily x 6 weeks. End Date: May 22, 2021  14. Constipation - Resolved.    15. Bilateral pleural effusions - S/p Left thoracentesis 12/8 but still with residual fluid. Will likely need repeat L tap in a few days - R thoracentesis 12/9.  - Repeat  CXR on Monday to  determine need for left thoracentesis again.   HHPT/HHRN ordered. Home antibiotics. Carolynn Sayers aware.    Length of Stay: [place], [place]  04/22/2021, 9:17 AM  Advanced Heart Failure Team Pager [phone] (M-F; 7a - 5p)  Please contact [facility] for night-coverage after hours (5p -7a ) and weekends on [email or web address]

## 2021-04-22 NOTE — Progress Notes (Signed)
While at pt's bedside to get pt's doppler, pt accidentally grabbed Aquasonic lubricant gel thinking it was Prosource and ingested a small amount. Pt states he feels fine. He rinsed his mouth with water immediately after it happened. RN spoke with [place], [place]. No new orders. Will continue to monitor pt.

## 2021-04-23 ENCOUNTER — Inpatient Hospital Stay (HOSPITAL_COMMUNITY): Payer: Medicare Other

## 2021-04-23 ENCOUNTER — Ambulatory Visit: Payer: Medicare Other | Admitting: Infectious Disease

## 2021-04-23 HISTORY — PX: IR THORACENTESIS ASP PLEURAL SPACE W/IMG GUIDE: IMG5380

## 2021-04-23 LAB — FERRITIN: Ferritin: 547 ng/mL — ABNORMAL HIGH (ref 24–336)

## 2021-04-23 LAB — BASIC METABOLIC PANEL
Anion gap: 9 (ref 5–15)
BUN: 12 mg/dL (ref 8–23)
CO2: 35 mmol/L — ABNORMAL HIGH (ref 22–32)
Calcium: 8.9 mg/dL (ref 8.9–10.3)
Chloride: 90 mmol/L — ABNORMAL LOW (ref 98–111)
Creatinine, Ser: 0.6 mg/dL — ABNORMAL LOW (ref 0.61–1.24)
GFR, Estimated: >60 mL/min (ref >60)
Glucose, Bld: 127 mg/dL — ABNORMAL HIGH (ref 70–99)
Potassium: 3.6 mmol/L (ref 3.5–5.1)
Sodium: 134 mmol/L — ABNORMAL LOW (ref 135–145)

## 2021-04-23 LAB — GLUCOSE, CAPILLARY
Glucose-Capillary: 138 mg/dL — ABNORMAL HIGH (ref 70–99)
Glucose-Capillary: 123 mg/dL — ABNORMAL HIGH (ref 70–99)
Glucose-Capillary: 100 mg/dL — ABNORMAL HIGH (ref 70–99)
Glucose-Capillary: 108 mg/dL — ABNORMAL HIGH (ref 70–99)

## 2021-04-23 LAB — CBC
HCT: 32 % — ABNORMAL LOW (ref 39.0–52.0)
Hemoglobin: 9.6 g/dL — ABNORMAL LOW (ref 13.0–17.0)
MCH: 24.2 pg — ABNORMAL LOW (ref 26.0–34.0)
MCHC: 30 g/dL (ref 30.0–36.0)
MCV: 80.8 fL (ref 80.0–100.0)
Platelets: 452 10*3/uL — ABNORMAL HIGH (ref 150–400)
RBC: 3.96 MIL/uL — ABNORMAL LOW (ref 4.22–5.81)
RDW: 18.6 % — ABNORMAL HIGH (ref 11.5–15.5)
WBC: 10.7 10*3/uL — ABNORMAL HIGH (ref 4.0–10.5)
nRBC: 0 % (ref 0.0–0.2)

## 2021-04-23 LAB — IRON AND TIBC
Iron: 14 ug/dL — ABNORMAL LOW (ref 45–182)
Saturation Ratios: 7 % — ABNORMAL LOW (ref 17.9–39.5)
TIBC: 209 ug/dL — ABNORMAL LOW (ref 250–450)
UIBC: 195 ug/dL

## 2021-04-23 LAB — COOXEMETRY PANEL
Carboxyhemoglobin: 1.9 % — ABNORMAL HIGH (ref 0.5–1.5)
Methemoglobin: 0.6 % (ref 0.0–1.5)
O2 Saturation: 61.1 %
Total hemoglobin: 9.8 g/dL — ABNORMAL LOW (ref 12.0–16.0)

## 2021-04-23 LAB — LACTATE DEHYDROGENASE: LDH: 141 U/L (ref 98–192)

## 2021-04-23 LAB — PROTIME-INR
INR: 1.4 — ABNORMAL HIGH (ref 0.8–1.2)
Prothrombin Time: 16.9 seconds — ABNORMAL HIGH (ref 11.4–15.2)

## 2021-04-23 MED ORDER — HEPARIN (PORCINE) 25000 UT/250ML-% IV SOLN
400.0000 [IU]/h | INTRAVENOUS | Status: DC
  Administered 2021-04-23 – 2021-04-28 (×3): 400 [IU]/h via INTRAVENOUS
  Filled 2021-04-23 (×5): qty 250

## 2021-04-23 MED ORDER — SODIUM CHLORIDE 0.9 % IV SOLN: 250.0000 mg | Freq: Every day | INTRAVENOUS | Status: DC

## 2021-04-23 MED ORDER — TORSEMIDE 20 MG PO TABS
40.0000 mg | ORAL_TABLET | Freq: Two times a day (BID) | ORAL | Status: DC
  Administered 2021-04-24 – 2021-04-26 (×6): 40 mg via ORAL
  Filled 2021-04-23 (×6): qty 2

## 2021-04-23 MED ORDER — LIDOCAINE HCL 1 % IJ SOLN
INTRAMUSCULAR | Status: AC
  Administered 2021-04-23: 5 mL
  Filled 2021-04-23: qty 20

## 2021-04-23 MED ORDER — SODIUM CHLORIDE 0.9 % IV SOLN
510.0000 mg | Freq: Once | INTRAVENOUS | Status: AC
  Administered 2021-04-23: 510 mg via INTRAVENOUS
  Filled 2021-04-23: qty 17

## 2021-04-23 MED ORDER — WARFARIN SODIUM 2.5 MG PO TABS
2.5000 mg | ORAL_TABLET | Freq: Once | ORAL | Status: AC
  Administered 2021-04-23: 2.5 mg via ORAL
  Filled 2021-04-23: qty 1

## 2021-04-23 MED ORDER — POTASSIUM CHLORIDE CRYS ER 20 MEQ PO TBCR
40.0000 meq | EXTENDED_RELEASE_TABLET | Freq: Once | ORAL | Status: AC
  Administered 2021-04-23: 40 meq via ORAL
  Filled 2021-04-23: qty 2

## 2021-04-23 NOTE — Progress Notes (Addendum)
[place] for warfarin Indication:  s/p VAD (HM3) , afib, hx DVT  No Active Allergies  Patient Measurements: Height: 6' (182.9 cm) Weight: 81.6 kg (179 lb 14.4 oz) IBW/kg (Calculated) : 77.6   Vital Signs: Temp: 98.5 F (36.9 C) (12/12 0759) Temp Source: Oral (12/12 0759) BP: 83/72 (12/12 1152) Pulse Rate: 86 (12/12 0927)  Labs: Recent Labs    04/21/21 0355 04/21/21 1128 04/22/21 0450 04/22/21 1800 04/23/21 0435  HGB 7.5*  --  8.9*  --  9.6*  HCT 25.1*  --  28.5*  --  32.0*  PLT 421*  --  444*  --  452*  LABPROT 22.7*  --  17.9*  --  16.9*  INR 2.0*  --  1.5*  --  1.4*  CREATININE  --    < > 0.63 0.60* 0.60*   < > = values in this interval not displayed.     Estimated Creatinine Clearance: 98.3 mL/min (A) (by C-G formula based on SCr of 0.6 mg/dL (L)).   Medical History: Past Medical History:  Diagnosis Date   AICD (automatic cardioverter/defibrillator) present 08/31/2019   Ankylosing spondylitis ([place])    Arthritis    BENIGN PROSTATIC HYPERTROPHY 06/07/2008   CHF (congestive heart failure) ([place])    COLONIC POLYPS, HX OF 06/07/2008   Depression    H/O hiatal hernia    Heart failure ([place])    HYPERLIPIDEMIA 06/07/2008   HYPERTENSION 06/07/2008   Myocardial infarction [facility]) 2005   NSTEMI, s/p LAD stent   NEPHROLITHIASIS, HX OF 06/07/2008   STEMI (ST elevation myocardial infarction) ([place]) 04/27/2019    Assessment: 65 yo M s/p HM 3 and AVR on 04/06/21. Prior to surgery/admit patient was on apixiban for a DVT.  Pt s/p thora 128 and 12/12. INR subtherapeutic at 1.4. LDH wnl, CBC stable. Pharmacy asked to begin low-dose IV heparin while INR low and start 6h after thora.  Goal of Therapy:  Warfarin INR 2.0-2.5 Monitor platelets by anticoagulation protocol: Yes   Plan:  Warfarin 2.5 mg PO x1 tonight Heparin 400 units/h no bolus starting at 1800 Daily INR, heparin level, CBC  Arrie Senate, PharmD, [place], [facility] Clinical  Pharmacist [phone] Please check AMION for all [place] numbers 04/23/2021

## 2021-04-23 NOTE — Plan of Care (Signed)
  Problem: Education: Goal: Knowledge of General Education information will improve Description: Including pain rating scale, medication(s)/side effects and non-pharmacologic comfort measures Outcome: Progressing   Problem: Health Behavior/Discharge Planning: Goal: Ability to manage health-related needs will improve Outcome: Progressing   Problem: Clinical Measurements: Goal: Ability to maintain clinical measurements within normal limits will improve Outcome: Progressing Goal: Will remain free from infection Outcome: Progressing Goal: Diagnostic test results will improve Outcome: Progressing Goal: Respiratory complications will improve Outcome: Progressing Goal: Cardiovascular complication will be avoided Outcome: Progressing   Problem: Activity: Goal: Risk for activity intolerance will decrease Outcome: Progressing   Problem: Nutrition: Goal: Adequate nutrition will be maintained Outcome: Progressing   Problem: Coping: Goal: Level of anxiety will decrease Outcome: Progressing   Problem: Elimination: Goal: Will not experience complications related to bowel motility Outcome: Progressing Goal: Will not experience complications related to urinary retention Outcome: Progressing   Problem: Pain Managment: Goal: General experience of comfort will improve Outcome: Progressing   Problem: Safety: Goal: Ability to remain free from injury will improve Outcome: Progressing   Problem: Skin Integrity: Goal: Risk for impaired skin integrity will decrease Outcome: Progressing   Problem: Education: Goal: Ability to demonstrate management of disease process will improve Outcome: Progressing Goal: Ability to verbalize understanding of medication therapies will improve Outcome: Progressing Goal: Individualized Educational Video(s) Outcome: Progressing   Problem: Activity: Goal: Capacity to carry out activities will improve Outcome: Progressing   Problem: Cardiac: Goal:  Ability to achieve and maintain adequate cardiopulmonary perfusion will improve Outcome: Progressing   Problem: Education: Goal: Knowledge of the prescribed therapeutic regimen will improve Outcome: Progressing   Problem: Activity: Goal: Risk for activity intolerance will decrease Outcome: Progressing   Problem: Cardiac: Goal: Ability to maintain an adequate cardiac output will improve Outcome: Progressing   Problem: Coping: Goal: Level of anxiety will decrease Outcome: Progressing   Problem: Fluid Volume: Goal: Risk for excess fluid volume will decrease Outcome: Progressing   Problem: Clinical Measurements: Goal: Ability to maintain clinical measurements within normal limits will improve Outcome: Progressing Goal: Will remain free from infection Outcome: Progressing   Problem: Respiratory: Goal: Will regain and/or maintain adequate ventilation Outcome: Progressing

## 2021-04-23 NOTE — Progress Notes (Signed)
Physical Therapy Treatment Patient Details Name: BRACY PEPPER MRN: [id number] DOB: May 27, 1955 Today's Date: 04/23/2021   History of Present Illness Pt is a 65 y.o. male admitted 03/07/21 with acute on chronic hypoxemic respiratory failure, cardiogenic shock. CT scan which shows bronchopneumonia and no indication of ILD. Prolonged admission undergoing workup for VAD. S/p LVAD placement 11/25. CXR 12/8 with significant bilateral pleural effusions. S/p L thoracentesis 12/8 and 12/12.  S/p R thoracentesis 12/9. Other PMH includes CHF, ankylosing spondylitis, bacteremia and  possible endocarditis s/p ICD removal, HTN, depression, arthritis.    PT Comments    Pt progressing well with mobility. Today's session focused on education and therapeutic exercise for balance and LE strengthening. Pt continues to be motivated to progress. Continue to recommend HHPT after discharge to maximize functional mobility and independence. Will continue to follow acutely to address established goals.    Recommendations for follow up therapy are one component of a multi-disciplinary discharge planning process, led by the attending physician.  Recommendations may be updated based on patient status, additional functional criteria and insurance authorization.  Follow Up Recommendations  Home health PT     Assistance Recommended at Discharge Intermittent Supervision/Assistance  Equipment Recommendations  None recommended by PT    Recommendations for Other Services       Precautions / Restrictions Precautions Precautions: Fall;Sternal;Other (comment) Precaution Comments: LVAD     Mobility  Bed Mobility Overal bed mobility: Needs Assistance Bed Mobility: Sit to Supine;Supine to Sit     Supine to sit: Supervision Sit to supine: Supervision   General bed mobility comments: supervision for lines    Transfers Overall transfer level: Needs assistance Equipment used: Rolling walker (2 wheels) Transfers: Sit  to/from Stand Sit to Stand: Min guard;Supervision           General transfer comment: Pt unable to stand with hands on legs. Gave cues to place hands right beside legs to stay inside "the tube" and pt able to perform multiple stands. Cues for forward weight translation and eccentric control    Ambulation/Gait Ambulation/Gait assistance: Supervision Gait Distance (Feet): 4 Feet Assistive device: Rolling walker (2 wheels) Gait Pattern/deviations: Step-through pattern;Decreased stride length;Trunk flexed Gait velocity: Decreased     General Gait Details: Ambulated a few steps but pt experienced sharp pain in L side at thoracentesis site so returned to EOB. Pt experienced this earlier today while walking.   Stairs             Wheelchair Mobility    Modified Rankin (Stroke Patients Only)       Balance Overall balance assessment: Needs assistance Sitting-balance support: No upper extremity supported;Feet supported Sitting balance-Leahy Scale: Good     Standing balance support: No upper extremity supported;During functional activity Standing balance-Leahy Scale: Fair Standing balance comment: can static stand without UE support but reliant on RW with standing acitivity                            Cognition Arousal/Alertness: Awake/alert Behavior During Therapy: WFL for tasks assessed/performed Overall Cognitive Status: Impaired/Different from baseline Area of Impairment: Problem solving;Memory;Attention                   Current Attention Level: Selective;Alternating Memory: Decreased short-term memory       Problem Solving: Requires verbal cues;Slow processing          Exercises General Exercises - Lower Extremity Hip Flexion/Marching: 10 reps;Both;Standing (Cues to keep hands  on RW for balance but not lean too heavily into it. Pt experienced LOB when not holding onto RW) Heel Raises: 10 reps;Both;Standing    General Comments General  comments (skin integrity, edema, etc.): Pt requiring intermittent cues for switching LVAD to battery power. Pt reports he will probably use the RW for the first couple days at home but would like to walk without it soon. Pt is motivated to progress and understands the importance of activity.      Pertinent Vitals/Pain Pain Assessment: 0-10 Pain Score: 7  Pain Location: L side at thoracentesis site Pain Descriptors / Indicators: Jabbing;Stabbing Pain Intervention(s): Limited activity within patient's tolerance;Monitored during session    Home Living                          Prior Function            PT Goals (current goals can now be found in the care plan section) Acute Rehab PT Goals Patient Stated Goal: return to walking without a device PT Goal Formulation: With patient Time For Goal Achievement: 05/03/21 Potential to Achieve Goals: Good Progress towards PT goals: Progressing toward goals    Frequency    Min 3X/week      PT Plan Current plan remains appropriate    Co-evaluation              AM-PAC PT "6 Clicks" Mobility   Outcome Measure  Help needed turning from your back to your side while in a flat bed without using bedrails?: None Help needed moving from lying on your back to sitting on the side of a flat bed without using bedrails?: A Little Help needed moving to and from a bed to a chair (including a wheelchair)?: A Little Help needed standing up from a chair using your arms (e.g., wheelchair or bedside chair)?: A Little Help needed to walk in hospital room?: A Little Help needed climbing 3-5 steps with a railing? : A Little 6 Click Score: 19    End of Session Equipment Utilized During Treatment: Oxygen Activity Tolerance: Patient tolerated treatment well Patient left: in bed;with call bell/phone within reach Nurse Communication: Mobility status PT Visit Diagnosis: Difficulty in walking, not elsewhere classified (R26.2);Muscle weakness  (generalized) (M62.81)     Time: 2549-8264 PT Time Calculation (min) (ACUTE ONLY): 40 min  Charges:  $Therapeutic Exercise: 8-22 mins $Therapeutic Activity: 8-22 mins $Self Care/Home Management: 8-22                     Brandon Melnick, SPT    Brandon Melnick 04/23/2021, 5:12 PM

## 2021-04-23 NOTE — Progress Notes (Signed)
PT Cancellation Note  Patient Details Name: Brandon [name] MRN: [id number] DOB: May 26, 1955   Cancelled Treatment:    Reason Eval/Treat Not Completed: Patient at procedure or test/unavailable (going for thoracentesis). Will follow-up for PT treatment as schedule permits.  Mabeline Caras, PT, DPT Acute Rehabilitation Services  Pager [phone] Office [place] 04/23/2021, 11:29 AM

## 2021-04-23 NOTE — Progress Notes (Signed)
Occupational Therapy Treatment Patient Details Name: Brandon [name] MRN: [id number] DOB: January 16, 1956 Today's Date: 04/23/2021   History of present illness Pt is a 65 y.o. male admitted 03/07/21 with acute on chronic hypoxemic respiratory failure, cardiogenic shock. CT scan which shows bronchopneumonia and no indication of ILD. Prolonged admission undergoing workup for VAD. S/p LVAD placement 11/25. CXR 12/8 with significant bilateral pleural effusions. S/p L thoracentesis 12/8.  S/p R thoracentesis 12/9. Other PMH includes CHF, ankylosing spondylitis, bacteremia and  possible endocarditis s/p ICD removal, HTN, depression, arthritis.   OT comments  This 65 yo male admitted with with above continues to make good gains with basic ADLs, mobility, and managing his LVAD. He will continue to benefit from acute OT without need for follow up. We will continue to follow and goals updated today.    Recommendations for follow up therapy are one component of a multi-disciplinary discharge planning process, led by the attending physician.  Recommendations may be updated based on patient status, additional functional criteria and insurance authorization.    Follow Up Recommendations  No OT follow up    Assistance Recommended at Discharge Intermittent Supervision/Assistance  Equipment Recommendations  BSC/3in1;Tub/shower seat       Precautions / Restrictions Precautions Precautions: Fall;Sternal;Other (comment) Precaution Comments: LVAD       Mobility Bed Mobility Overal bed mobility: Needs Assistance Bed Mobility: Sit to Supine     Supine to sit: Supervision          Transfers Overall transfer level: Needs assistance Equipment used: Rolling walker (2 wheels) Transfers: Sit to/from Stand Sit to Stand: Min guard                 Balance Overall balance assessment: Needs assistance Sitting-balance support: No upper extremity supported;Feet supported Sitting balance-Leahy Scale:  Good     Standing balance support: No upper extremity supported;During functional activity Standing balance-Leahy Scale: Fair                             ADL either performed or assessed with clinical judgement   ADL Overall ADL's : Needs assistance/impaired     Grooming: Oral care;Standing;Min guard               Lower Body Dressing: Min guard;Sit to/from stand   Toilet Transfer: Min guard;Ambulation;Rolling walker (2 wheels);BSC/3in1 Toilet Transfer Details (indicate cue type and reason): over toilet                Extremity/Trunk Assessment Upper Extremity Assessment Upper Extremity Assessment: Overall WFL for tasks assessed RUE Deficits / Details: hx of DVT in R UE a few months ago, reports decreased pinch and grip strength. Impaired sensation but functional            Vision Baseline Vision/History: 1 Wears glasses Ability to See in Adequate Light: 0 Adequate Patient Visual Report: No change from baseline            Cognition Arousal/Alertness: Awake/alert Behavior During Therapy: WFL for tasks assessed/performed Overall Cognitive Status: Within Functional Limits for tasks assessed                                                       Pertinent Vitals/ Pain       Pain Assessment: 0-10 Faces  Pain Scale: Hurts little more Pain Location: right side Pain Descriptors / Indicators: Aching;Sore Pain Intervention(s): Limited activity within patient's tolerance;Repositioned         Frequency  Min 2X/week        Progress Toward Goals  OT Goals(current goals can now be found in the care plan section)  Progress towards OT goals: Progressing toward goals  Acute Rehab OT Goals Patient Stated Goal: to be able to go home sooner than later OT Goal Formulation: With patient Time For Goal Achievement: 05/07/21 Potential to Achieve Goals: Good  Plan Discharge plan remains appropriate       AM-PAC OT "6 Clicks"  Daily Activity     Outcome Measure   Help from another person eating meals?: None Help from another person taking care of personal grooming?: A Little Help from another person toileting, which includes using toliet, bedpan, or urinal?: A Little Help from another person bathing (including washing, rinsing, drying)?: A Little Help from another person to put on and taking off regular upper body clothing?: A Little Help from another person to put on and taking off regular lower body clothing?: A Little 6 Click Score: 19    End of Session Equipment Utilized During Treatment: Oxygen (2 liters)  OT Visit Diagnosis: Unsteadiness on feet (R26.81);Other abnormalities of gait and mobility (R26.89);Muscle weakness (generalized) (M62.81)   Activity Tolerance Patient tolerated treatment well   Patient Left in chair;with call bell/phone within reach           Time: 1031-1104 OT Time Calculation (min): 33 min  Charges: OT General Charges $OT Visit: 1 Visit OT Treatments $Self Care/Home Management : 23-37 mins  Brandon [name], OTR/L Acute [employer] Pager [phone] Office [phone]    Brandon [name] 04/23/2021, 12:18 PM

## 2021-04-23 NOTE — Procedures (Signed)
PROCEDURE SUMMARY:  Successful US guided left thoracentesis. Yielded 1L of amber fluid. Pt tolerated procedure well. No immediate complications.  Specimen not sent for labs. CXR ordered.  EBL < 5 mL  Menno Vanbergen PA-C 04/23/2021 11:54 AM

## 2021-04-23 NOTE — Progress Notes (Addendum)
LVAD Coordinator Rounding Note:  Admitted 03/07/21 due to sepsis. Dr. [name] consulted as his HF Cardiologist.   HM III LVAD implanted on 04/06/21 by Dr. Cyndia Bent under Destination Therapy criteria. Not a transplant candidate at this time due to recurrent infections.   Pt sitting up on side of bed with PT upon my arrival. Reports SOB is better after right/left thoracentesis last week. Reports right sided chest discomfort this morning. O2 sat 100% on 2L [place].   Bedside RN accompanied pt to IR for thoracentesis this morning. Pt tolerated the procedure well. 1 L amber fluid drained from left lung.  INR 1.4. Will start low-dose heparin drip 6 hours after thoracentesis per Dr [name].   Antibiotics per ID for possible AV endocarditis. Per ID note on 04/11/21 he will need 6 weeks IV antibiotics with end date 05/22/21. Currently receiving Ceftriaxone daily. Per ID tissue specimen sent to [facility] for 16 S ribosomal sequencing per PCR- results pending. Advanced home health and [facility] set up for medication/PICC maintenance at hospital discharge per case manager. Will need ID appt rescheduled at discharge.   Patient's home equipment arrived. Patient and family VAD discharge education complete.   Vital signs: Temp: 98.5 HR: 88 Doppler: 94 Automatic BP: 110/97 (103) O2 Sat: 100% on 2 L/[place] Wt:171.5>190>187.4>187>183.8>184>185>180.3>181.8>177.6>176.15>174.8 lbs     LVAD interrogation reveals:  Speed: 5800 Flow: 5.2 Power: 4.8w PI: 3.5  Alarms: none Events: 8 PI events yesterday; none today Hematocrit: 32  Fixed speed: 5600 Low speed limit: 5300  Drive Line:  Existing VAD dressing removed and site care performed using sterile technique. Drive line exit site cleaned with Chlora prep applicators x 2, rinsed with saline, allowed to dry, and gauze dressing with silver strip applied. Exit site unincorporated with suture intact, the velour is fully implanted at exit site. Slight redness  around sutures noted. Scant dried drainage on silver strip, no tenderness, foul odor or rash noted. Drive line anchor re-applied. Continue twice a week dressing changes on Monday and Thursday per VAD Coordinator, Nurse Davonna Belling, or Tomi Bamberger. Next dressing change due 04/26/21.     Labs:  LDH trend: 309>248>215>206>201>167>165>158>161>193>160>141  INR trend: 1.6>1.3>1.3>1.4>1.5>1.6>1.8>2.0>2.0>2.0>1.8>1.7>1.4  Anticoagulation Plan: -INR Goal: 2 - 2.5 -ASA Dose: 81 mg daily  Blood Products:   Intra-op: - 04/06/21>>4 PRBCs, 4 FFP, 1263 cc cell saver  Post-op: - 04/06/21>>1 unit Plts - 04/07/21>>1 unit PRBC  Device: N/A  Arrythmias: 03/22/21 - developed Afib w/ RVR, converted with IV amiodarone  Respiratory: extubated 04/07/21  Nitric Oxide: off 04/07/21  Gtts:   Infection: - 04/11/21 Blood cultures x 2>> no growth x 5 days; final - 04/19/21 Body fluid cx (left lung)>> no growth <24 hours - 04/19/21 Gram stain (left lung)>> no WBC or organisms seen; final - 04/20/21 Gram stain (right lung)>> rare WBC, no organisms seen; final  Patient/Family Teaching:  VAD discharge teaching completed with patient, his sister [name], and his friend [name]. See separate note for documentation.  [name] able to do dressing changes with bedside nurse.  Plan/Recommendations:  1. Call VAD Coordinator for any VAD equipment or drive line issues. 2.  Daily dressing changes per VAD Coordinator, Nurse Davonna Belling, or trained caregiver. 3. VAD discharge teaching completed with patient and family.   Emerson Monte RN [place] Coordinator  Office: [phone]  24/7 Pager: [phone]

## 2021-04-23 NOTE — Progress Notes (Addendum)
Patient ID: Brandon [name], male   DOB: 12-03-1955, 65 y.o.   MRN: [id number]      Advanced Heart Failure Rounding Note  PCP-Cardiologist: [name], MD  [facility]: Dr. [name]   Subjective:    Admitted with sepsis. Started on cefepime + vanc, now off abx (ID following). Afebrile.  10/29 Co-ox 48%, started on milrinone 0.25.  10/30 IV Antibiotics stopped. Blood cultures no growth for 5 days.  10/31 started on Augmentin for PNA.  11/1 Milrinone off 11/2 Milrinone added back. Midodrine increased to 10 mg TID 11/10 Developed Afib w/ RVR, converted with IV amio 11/14 Milrinone increased to 0.25 mcg. Diuresed with IV lasix  11/25 Underwent HM-3 VAD placement and bioprosthetic AVR. Had persistent hypotension in OR requiring methylene blue and high dose pressors.  11/26 Extubated 11/28 Started on lasix gtt>>later stopped due to low CVP (1) and increasing pressor requirements. Restarted on scheduled BID IV Lasix once pressure stabilized (still volume up)  11/28 Surgical path with evidence of possible "acute AoV endocarditis". ID consulted. Bcx recollected. Tissue sent to [facility] in [place].   11/30 Started on IV ceftriaxone to cover the Streptococcus gordonae that was isolated before, per ID. 11/30 VAD speed increased to 5600 12/1 Diuresed with IV lasix + metolazone. Milrinone was stopped. Amio drip transitioned to amio 200 mg twice a day.  12/5 Ramp Echo speed increased to 5500. Epi stopped.  12/6 Transferred to [place]. Had BM. Started on spiro 12.5 mg daily 12/8 CXR with significant bilateral pleural effusions. S/p Left Thoracentesis w/ 1.7 L out but still with residual fluid 12/9 Rt Thoracentesis  12/11 Given dose of tolvaptan and diuresed with IV lasix. Negative 2.5 liters.   Sodium up to 134. Diuresed almost 4L. No weight from this am.   INR 1.4   Feels ok. Says he feels better than yesterday.   LVAD Interrogation HM 3: Speed: 5800 Flow: 5.2 PI: 3.4  Power: 5. Parameters stable.     Objective:   Weight Range: 81.6 kg Body mass index is 24.4 kg/m.   Vital Signs:   Temp:  [97.4 F (36.3 C)-98.1 F (36.7 C)] 97.5 F (36.4 C) (12/12 0417) Pulse Rate:  [88-97] 97 (12/11 2353) Resp:  [14-19] 19 (12/12 0417) BP: (86-143)/(62-86) 90/69 (12/11 2353) SpO2:  [95 %] 95 % (12/11 2353) Last BM Date: 04/21/21  Weight change: Filed Weights   04/20/21 0502 04/21/21 0651 04/22/21 0417  Weight: 79.3 kg 79.2 kg 81.6 kg    Intake/Output:   Intake/Output Summary (Last 24 hours) at 04/23/2021 0652 Last data filed at 04/23/2021 0400 Gross per 24 hour  Intake 1320 ml  Output 3875 ml  Net -2555 ml     Physical Exam  Doppler Map 80s  CVP 3 personally checked.  Physical Exam: GENERAL: No acute distress. Sitting in the chair.  HEENT: normal  NECK: Supple, JVP  flat .  2+ bilaterally, no bruits.  No lymphadenopathy or thyromegaly appreciated.   CARDIAC:  Mechanical heart sounds with LVAD hum present.  LUNGS:  Decreased on the left.   ABDOMEN:  Soft, round, nontender, positive bowel sounds x4.     LVAD exit site: Dressing dry and intact.  No erythema or drainage.  Stabilization device present and accurately applied.  Driveline dressing is being changed daily per sterile technique. EXTREMITIES:  Warm and dry, no cyanosis, clubbing, rash or edema . RUE PICC site ok.  NEUROLOGIC:  Alert and oriented x 3.    No aphasia.  No  dysarthria.  Affect pleasant.      Telemetry   SR-ST 80-100s   Labs    CBC Recent Labs    04/22/21 0450 04/23/21 0435  WBC 11.0* 10.7*  HGB 8.9* 9.6*  HCT 28.5* 32.0*  MCV 79.4* 80.8  PLT 444* 580*   Basic Metabolic Panel Recent Labs    04/22/21 1800 04/23/21 0435  NA 135 134*  K 3.6 3.6  CL 92* 90*  CO2 35* 35*  GLUCOSE 94 127*  BUN 13 12  CREATININE 0.60* 0.60*  CALCIUM 8.5* 8.9   Liver Function Tests No results for input(s): AST, ALT, ALKPHOS, BILITOT, PROT, ALBUMIN in the last 72 hours.    No results for input(s):  LIPASE, AMYLASE in the last 72 hours. Cardiac Enzymes No results for input(s): CKTOTAL, CKMB, CKMBINDEX, TROPONINI in the last 72 hours.  BNP: BNP (last 3 results) Recent Labs    04/07/21 0500 04/13/21 0333 04/20/21 0600  BNP 663.2* 438.2* 193.0*    ProBNP (last 3 results) No results for input(s): PROBNP in the last 8760 hours.    D-Dimer No results for input(s): DDIMER in the last 72 hours. Hemoglobin A1C No results for input(s): HGBA1C in the last 72 hours.  Fasting Lipid Panel No results for input(s): CHOL, HDL, LDLCALC, TRIG, CHOLHDL, LDLDIRECT in the last 72 hours. Thyroid Function Tests No results for input(s): TSH, T4TOTAL, T3FREE, THYROIDAB in the last 72 hours.  Invalid input(s): FREET3  Other results:   Imaging    No results found.   Medications:     Scheduled Medications:  (feeding supplement) PROSource Plus  30 mL Oral TID PC & HS   sodium chloride   Intravenous Once   amiodarone  200 mg Oral Daily   aspirin EC  81 mg Oral Daily   Chlorhexidine Gluconate Cloth  6 each Topical Daily   digoxin  0.125 mg Oral Daily   docusate sodium  200 mg Oral Daily   doxycycline  100 mg Oral Q12H   eplerenone  25 mg Oral Daily   ezetimibe  10 mg Oral Daily   feeding supplement  237 mL Oral TID BM   folic acid  1 mg Oral Daily   furosemide  80 mg Intravenous BID   gabapentin  200 mg Oral TID   insulin aspart  0-15 Units Subcutaneous TID WC   insulin detemir  15 Units Subcutaneous Daily   mouth rinse  15 mL Mouth Rinse BID   melatonin  5 mg Oral QHS   midodrine  10 mg Oral TID WC   multivitamin with minerals  1 tablet Oral Daily   pantoprazole  40 mg Oral Daily   polyethylene glycol  17 g Oral Daily   sertraline  100 mg Oral Daily   tamsulosin  0.4 mg Oral Daily   vitamin B-12  100 mcg Oral Daily   Warfarin - Pharmacist Dosing Inpatient   Does not apply q1600    Infusions:  sodium chloride Stopped (04/14/21 1149)   cefTRIAXone (ROCEPHIN)  IV 2 g  (04/22/21 1021)   lactated ringers Stopped (04/13/21 1053)     PRN Medications: sodium chloride, bisacodyl **OR** bisacodyl, lidocaine, morphine injection, ondansetron (ZOFRAN) IV, oxyCODONE, polyvinyl alcohol, sodium chloride flush, traMADol    Patient Profile   Brandon [name] is a 65 year old with history of smoker, CAD s/p previous MI, HTN, HL, chronic HFrEF, sepsis 11/2020 bacteremia.  Admitted with sepsis. Bld Cx - NGTD    Assessment/Plan  1. Acute on chronic systolic HFr EF -> cardiogenic shock - Echo 05/19/20 EF 20-25% RV mildly HK. moderate AS  Mean gradient 13 AVA 1.2 cm2 DI 0.30 - Persistent NYHA IV. Admitted with shock - s/p HM-III VAD + bioprosthetic AVR on 11/25 - RV looked good on TEE in OR - Off pressors. Co-ox 61%  - CVP 3. Volume status improved. Continue IV lasix today then tomorrow start torsemide 40 mg twice a d ay.  - MAP 80s. On midodrine 10 tid - can wean as tolerated - Continue digoxin 0.125.  - Continue eplerenone 25 daily - Renal function stable.   2. HM-3 LVAD implant - Plan as above - VAD interrogated personally. Parameters stable. - INR 1.4  Continue ASA 81 for 30 days. Stop ASA 05/14/21  - Will neeed to start low dose heparin.  - LDH stable  - Had ramp ECHO speed increased to 5800. Rare PI events.   3. Acute Hypoxic Respiratory Failure  - Extubated 11/26, stable on 2L .    4. 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 CAD  s/p anterior STEMI (12/20). LHC showed chronically occluded RCA (with L>>R collaterals) and thrombotic occlusion of proximal LAD. Underwent PCI of LAD. - No s/s angina - Continue atorvastatin 80.   6. Iron-deficiency anemia & ABLA - Rec'd Feraheme 11/8 and 11/13  - Keep hgb > 8.0,  9.6 today,  - Rec'd 1 unit PRBCs on 12/10 - 04/23/21 Iron sats low. Give feraheme  7. Hypokalemia/ Hypomagnesemia  - Keep K > 4.0 Mg > 2.0  -  Supp K   8. Severe protein-calorie malnutrition - prealbumin 11>15> 13 >6.6 - Continue aggressive nutritional support  9. Paroxysmal Atrial Fibrillation w/ RVR - new, developed 11/10. Maintaining SR/ST    - Off amio drip. Continue amio 200 mg twice a day.  - on warfarin. INR 1.4  today.   10. Hyponatremia - sodium 131>133>130-> 125>134  - Sodium up after tolvaptan. Stable today.   11. Sepsis - H/o strep bacteremia 11/2020.  ICD extracted 12/04/20. Barostim was not removed as it is extravascular.  - Received IV antibiotics until 01/15/21. Blood cultures negative 01/30/21..  - 10/26 -Blood CX x2  Negative  - Ct chest concerning for pneumonia. 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 Hemorrhagic Pericarditis - Fibrin clot encasing heart at time of surgery.  Will need to be careful with anticoagulation (go slowly) - PharmD managing.   9. 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 - Ceftriaxone 2 g IV daily x 6 weeks. End Date: May 22, 2021  14. Constipation - Resolved.    15. Bilateral pleural effusions - S/p Left thoracentesis 12/8 but still with residual fluid. Will likely need repeat L tap in a few days -  R thoracentesis 12/9.  - Repeat CXR today    HHPT/HHRN ordered. Home antibiotics. Carolynn Sayers aware.     Length of Stay: [place], NP  04/23/2021, 6:52 AM  Advanced Heart Failure Team Pager [phone] (M-F; 7a - 5p)  Please contact [facility] for night-coverage after hours (5p -7a ) and weekends on [email or web address]  Patient seen and examined with the above-signed Advanced Practice Provider and/or Housestaff. I personally reviewed laboratory data, imaging studies and relevant notes. I independently examined the patient and formulated the important aspects of the plan. I have edited the note to reflect any of my changes or salient points. I have personally discussed the plan with the patient and/or family.  Diuresed well. He is feeling a bit better. Still with some chest soreness. Serum sodium improved. INR down to 1.4. Iron stores low.  General:  NAD.  HEENT: normal  Neck: supple. JVP not elevated.  Carotids 2+ bilat; no bruits. No lymphadenopathy or thryomegaly appreciated. Cor: LVAD hum.  Lungs: Decreased in both bases L>R Abdomen: obese soft, nontender, non-distended. No hepatosplenomegaly. No bruits or masses. Good bowel sounds. Driveline site clean. Anchor in place.  Extremities: no cyanosis, clubbing, rash. Warm 1+ edema  Neuro: alert & oriented x 3. No focal deficits. Moves all 4 without problem   Repeat CXR this am to reassess L effusion. Tap as needed per IR. Continue IV diuresis. Continue to restrict FW.   Start low-dose heparin 6 hours after tap. Increase warfarin. Continue to mobilize. VAD interrogated personally. Parameters stable.  Glori Bickers, MD  7:54 AM

## 2021-04-23 NOTE — Plan of Care (Signed)
  Problem: Education: Goal: Knowledge of General Education information will improve Description: Including pain rating scale, medication(s)/side effects and non-pharmacologic comfort measures Outcome: Progressing   Problem: Health Behavior/Discharge Planning: Goal: Ability to manage health-related needs will improve Outcome: Progressing   Problem: Clinical Measurements: Goal: Ability to maintain clinical measurements within normal limits will improve Outcome: Progressing Goal: Will remain free from infection Outcome: Progressing Goal: Diagnostic test results will improve Outcome: Progressing Goal: Respiratory complications will improve Outcome: Progressing Goal: Cardiovascular complication will be avoided Outcome: Progressing   Problem: Activity: Goal: Risk for activity intolerance will decrease Outcome: Progressing   Problem: Nutrition: Goal: Adequate nutrition will be maintained Outcome: Progressing   Problem: Coping: Goal: Level of anxiety will decrease Outcome: Progressing   Problem: Elimination: Goal: Will not experience complications related to bowel motility Outcome: Progressing Goal: Will not experience complications related to urinary retention Outcome: Progressing   Problem: Pain Managment: Goal: General experience of comfort will improve Outcome: Progressing   Problem: Safety: Goal: Ability to remain free from injury will improve Outcome: Progressing   Problem: Skin Integrity: Goal: Risk for impaired skin integrity will decrease Outcome: Progressing   Problem: Education: Goal: Ability to demonstrate management of disease process will improve Outcome: Progressing Goal: Ability to verbalize understanding of medication therapies will improve Outcome: Progressing Goal: Individualized Educational Video(s) Outcome: Progressing   Problem: Activity: Goal: Capacity to carry out activities will improve Outcome: Progressing   Problem: Cardiac: Goal:  Ability to achieve and maintain adequate cardiopulmonary perfusion will improve Outcome: Progressing   Problem: Education: Goal: Knowledge of the prescribed therapeutic regimen will improve Outcome: Progressing   Problem: Activity: Goal: Risk for activity intolerance will decrease Outcome: Progressing   Problem: Cardiac: Goal: Ability to maintain an adequate cardiac output will improve Outcome: Progressing   Problem: Coping: Goal: Level of anxiety will decrease Outcome: Progressing   Problem: Fluid Volume: Goal: Risk for excess fluid volume will decrease Outcome: Progressing   Problem: Clinical Measurements: Goal: Ability to maintain clinical measurements within normal limits will improve Outcome: Progressing Goal: Will remain free from infection Outcome: Progressing

## 2021-04-23 NOTE — Progress Notes (Signed)
Inpatient Diabetes Program Recommendations  AACE/ADA: New Consensus Statement on Inpatient Glycemic Control (2015)  Target Ranges:  Prepandial:   less than 140 mg/dL      Peak postprandial:   less than 180 mg/dL (1-2 hours)      Critically ill patients:  140 - 180 mg/dL   Lab Results  Component Value Date   GLUCAP 138 (H) 04/23/2021   HGBA1C 6.0 (H) 04/07/2021    Noted mild low following Novolog 2 units. Consider decreasing Levemir to 8 units Qd.   Thanks, Bronson Curb, MSN, RNC-OB Diabetes Coordinator [phone] (8a-5p)

## 2021-04-23 NOTE — Progress Notes (Signed)
Mobility Specialist: Progress Note   04/23/21 1428  Mobility  Activity Ambulated in hall  Level of Assistance Contact guard assist, steadying assist  Assistive Device Front wheel walker  Distance Ambulated (ft) 272 ft (136'x2)  Mobility Ambulated with assistance in hallway  Mobility Response Tolerated fair  Mobility performed by Mobility specialist  $Mobility charge 1 Mobility   Pt to [facility] and then agreeable to ambulation. Pt required some assistance transferring to batteries today. Pt required one seated break halfway through ambulation d/t pain from thoracentesis this morning, otherwise no other c/o. Pt to bed after walk with call bell at his side.   [facility] Owais Pruett Mobility Specialist Mobility Specialist Phone #1: [phone] Mobility Specialist Phone #2: [phone]

## 2021-04-24 LAB — PROTIME-INR
INR: 1.5 — ABNORMAL HIGH (ref 0.8–1.2)
Prothrombin Time: 18.2 seconds — ABNORMAL HIGH (ref 11.4–15.2)

## 2021-04-24 LAB — CBC
HCT: 29.8 % — ABNORMAL LOW (ref 39.0–52.0)
Hemoglobin: 8.9 g/dL — ABNORMAL LOW (ref 13.0–17.0)
MCH: 24 pg — ABNORMAL LOW (ref 26.0–34.0)
MCHC: 29.9 g/dL — ABNORMAL LOW (ref 30.0–36.0)
MCV: 80.3 fL (ref 80.0–100.0)
Platelets: 389 10*3/uL (ref 150–400)
RBC: 3.71 MIL/uL — ABNORMAL LOW (ref 4.22–5.81)
RDW: 18.6 % — ABNORMAL HIGH (ref 11.5–15.5)
WBC: 9.2 10*3/uL (ref 4.0–10.5)
nRBC: 0 % (ref 0.0–0.2)

## 2021-04-24 LAB — CULTURE, BODY FLUID W GRAM STAIN -BOTTLE: Culture: NO GROWTH

## 2021-04-24 LAB — BASIC METABOLIC PANEL
Anion gap: 8 (ref 5–15)
BUN: 12 mg/dL (ref 8–23)
CO2: 35 mmol/L — ABNORMAL HIGH (ref 22–32)
Calcium: 8.6 mg/dL — ABNORMAL LOW (ref 8.9–10.3)
Chloride: 88 mmol/L — ABNORMAL LOW (ref 98–111)
Creatinine, Ser: 0.6 mg/dL — ABNORMAL LOW (ref 0.61–1.24)
GFR, Estimated: >60 mL/min (ref >60)
Glucose, Bld: 109 mg/dL — ABNORMAL HIGH (ref 70–99)
Potassium: 3.1 mmol/L — ABNORMAL LOW (ref 3.5–5.1)
Sodium: 131 mmol/L — ABNORMAL LOW (ref 135–145)

## 2021-04-24 LAB — GLUCOSE, CAPILLARY
Glucose-Capillary: 131 mg/dL — ABNORMAL HIGH (ref 70–99)
Glucose-Capillary: 110 mg/dL — ABNORMAL HIGH (ref 70–99)
Glucose-Capillary: 112 mg/dL — ABNORMAL HIGH (ref 70–99)
Glucose-Capillary: 111 mg/dL — ABNORMAL HIGH (ref 70–99)

## 2021-04-24 LAB — LACTATE DEHYDROGENASE: LDH: 115 U/L (ref 98–192)

## 2021-04-24 LAB — COOXEMETRY PANEL
Carboxyhemoglobin: 1.8 % — ABNORMAL HIGH (ref 0.5–1.5)
Methemoglobin: 1 % (ref 0.0–1.5)
O2 Saturation: 63.3 %
Total hemoglobin: 9.2 g/dL — ABNORMAL LOW (ref 12.0–16.0)

## 2021-04-24 LAB — HEPARIN LEVEL (UNFRACTIONATED): Heparin Unfractionated: <0.1 IU/mL — ABNORMAL LOW (ref 0.30–0.70)

## 2021-04-24 MED ORDER — POTASSIUM CHLORIDE CRYS ER 20 MEQ PO TBCR
40.0000 meq | EXTENDED_RELEASE_TABLET | ORAL | Status: AC
  Administered 2021-04-24 (×2): 40 meq via ORAL
  Filled 2021-04-24 (×2): qty 2

## 2021-04-24 MED ORDER — GABAPENTIN 300 MG PO CAPS
300.0000 mg | ORAL_CAPSULE | Freq: Three times a day (TID) | ORAL | Status: DC
  Administered 2021-04-24 – 2021-05-09 (×45): 300 mg via ORAL
  Filled 2021-04-24 (×45): qty 1

## 2021-04-24 MED ORDER — MIDODRINE HCL 5 MG PO TABS
5.0000 mg | ORAL_TABLET | Freq: Three times a day (TID) | ORAL | Status: DC
  Administered 2021-04-24 – 2021-04-26 (×6): 5 mg via ORAL
  Filled 2021-04-24 (×6): qty 1

## 2021-04-24 MED ORDER — WARFARIN SODIUM 2.5 MG PO TABS
2.5000 mg | ORAL_TABLET | Freq: Once | ORAL | Status: AC
  Administered 2021-04-24: 2.5 mg via ORAL
  Filled 2021-04-24: qty 1

## 2021-04-24 MED ORDER — ACETAMINOPHEN 325 MG PO TABS
650.0000 mg | ORAL_TABLET | Freq: Four times a day (QID) | ORAL | Status: DC | PRN
  Administered 2021-04-30 – 2021-05-06 (×2): 650 mg via ORAL
  Filled 2021-04-24 (×2): qty 2

## 2021-04-24 NOTE — Progress Notes (Signed)
CARDIAC REHAB PHASE I   PRE:  Rate/Rhythm: 89 SR  BP:  Sitting: 82/59 (65)      SaO2: 94 2L  MODE:  Ambulation: 220 ft +64ft   POST:  Rate/Rhythm: 93 SR  BP:  Sitting: 98/72 (82)    SaO2: 93 2L   After some encouragement, pt agreeable to ambulate. Pt able to switch from wall power to batteries with some guidance. Pt ambulated 270ft in hallway assist of one with front wheel walker. Pt took a seated rest break c/o SOB. Pt then able to ambulate 26ft back to room. Pt returned to recliner, agreeable to stay on batteries until walk later this afternoon. Provided support and encouragement. Will continue to follow.  [phone] Rufina Falco, RN BSN 04/24/2021 11:54 AM

## 2021-04-24 NOTE — Progress Notes (Signed)
LVAD Coordinator Rounding Note:  Admitted 03/07/21 due to sepsis. Dr. [name] consulted as his HF Cardiologist.   HM III LVAD implanted on 04/06/21 by Dr. Cyndia Bent under Destination Therapy criteria. Not a transplant candidate at this time due to recurrent infections.   Pt sitting up in bed upon my arrival. Reports SOB is better after right/left thoracentesis last week. Reports left shoulder pain this morning. O2 sat 100% on 2L [place].   Antibiotics per ID for possible AV endocarditis. Per ID note on 04/11/21 he will need 6 weeks IV antibiotics with end date 05/22/21. Currently receiving Ceftriaxone daily. Per ID tissue specimen sent to [facility] for 16 S ribosomal sequencing per PCR- results pending. Advanced home health and [facility] set up for medication/PICC maintenance at hospital discharge per case manager. Will need ID appt rescheduled at discharge.   Patient's home equipment arrived. Patient and family VAD discharge education complete.   Vital signs: Temp: 97.8 HR: 88 Doppler: 79 Automatic BP: 87/70 (78) O2 Sat: 100% on 2 L/[place] Wt:171.5>190>187.4>187>183.8>184>185>180.3>181.8>177.6>176.15>174.8>171.3 lbs     LVAD interrogation reveals:  Speed: 5800 Flow: 4.8 Power: 4.7w PI: 3.5  Alarms: none Events: 3 today Hematocrit: 29  Fixed speed: 5800 Low speed limit: 5500  Drive Line:  CDI. Drive line anchor secure. Continue twice a week dressing changes on Monday and Thursday per VAD Coordinator, Nurse Davonna Belling, or Tomi Bamberger. Next dressing change due 04/26/21.     Labs:  LDH trend: 309>248>215>206>201>167>165>158>161>193>160>141>115  INR trend: 1.6>1.3>1.3>1.4>1.5>1.6>1.8>2.0>2.0>2.0>1.8>1.7>1.4>1.5  Anticoagulation Plan: -INR Goal: 2 - 2.5 -ASA Dose: 81 mg daily  Blood Products:   Intra-op: - 04/06/21>>4 PRBCs, 4 FFP, 1263 cc cell saver  Post-op: - 04/06/21>>1 unit Plts - 04/07/21>>1 unit PRBC  Device: N/A  Arrythmias: 03/22/21 - developed Afib w/ RVR,  converted with IV amiodarone  Respiratory: extubated 04/07/21  Nitric Oxide: off 04/07/21  Gtts: Heparin 400 u/hr  Infection: - 04/11/21 Blood cultures x 2>> no growth x 5 days; final - 04/19/21 Body fluid cx (left lung)>> no growth <24 hours - 04/19/21 Gram stain (left lung)>> no WBC or organisms seen; final - 04/20/21 Gram stain (right lung)>> rare WBC, no organisms seen; final  Patient/Family Teaching:  VAD discharge teaching completed with patient, his sister [name], and his friend Tomi Bamberger. See separate note for documentation.  Marciaable to do dressing changes with bedside nurse.  Plan/Recommendations:  1. Call VAD Coordinator for any VAD equipment or drive line issues. 2.  Daily dressing changes per VAD Coordinator, Nurse Davonna Belling, or trained caregiver. 3. VAD discharge teaching completed with patient and family.   Tanda Rockers RN [place] Coordinator  Office: [phone]  24/7 Pager: [phone]

## 2021-04-24 NOTE — Progress Notes (Signed)
Mobility Specialist: Progress Note   04/24/21 1458  Mobility  Activity Ambulated in hall  Level of Assistance Contact guard assist, steadying assist  [place] wheel walker  Distance Ambulated (ft) 400 ft (200'x2)  Mobility Ambulated with assistance in hallway  Mobility Response Tolerated well  Mobility performed by Mobility specialist  Bed Position Chair  $Mobility charge 1 Mobility   Pt independent to stand from the recliner. Pt required one seated break halfway through ambulation d/t pain in his L side, otherwise no other c/o. Pt to recliner after walk and switched off batteries back onto room unit with call bell in reach. Pt did need assistance when changing batteries as he was trying to disconnect his driveline, RN made aware.   [facility] Chiquitta Matty Mobility Specialist Mobility Specialist [address] [place]: [phone] Mobility Specialist [address] [place]: [phone]

## 2021-04-24 NOTE — Progress Notes (Signed)
Nutrition Follow-up  DOCUMENTATION CODES:   Severe malnutrition in context of chronic illness  INTERVENTION:   - Continue Ensure Enlive po TID, each supplement provides 350 kcal and 20 grams of protein  - Continue ProSource Plus 30 ml po QID, each supplement provides 100 kcal and 15 grams of protein  - Continue MVI with minerals daily  - Continue with liberalized Regular diet  NUTRITION DIAGNOSIS:   Severe Malnutrition related to chronic illness (CHF) as evidenced by severe muscle depletion, severe fat depletion.  Ongoing, being addressed via oral nutrition supplements  GOAL:   Patient will meet greater than or equal to 90% of their needs  Progressing  MONITOR:   PO intake, Supplement acceptance, Labs, Weight trends, Skin, I & O's  REASON FOR ASSESSMENT:   Consult Enteral/tube feeding initiation and management, Assessment of nutrition requirement/status  ASSESSMENT:   65 y.o. male presented to the ED with sudden onset of dyspnea. PMH includes CAD, CHF with ICD, HTN, and on 2L [place] at baseline. Pt admitted with respiratory failure with hypoxia with SIRS.  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 Pt getting ready to mobilize. Pt reports that he continues to eat well and is finishing most meals. Pt reports that he continues to consume Ensure and ProSource supplements but cannot drink them within 30 minutes of each other due to GI distress.  Pt reports that he may be discharging on Friday. Pt plans to continue to consume Ensure supplements at discharge. Encouraged pt to drink 2-3 Ensure supplements after discharge in addition to eating 3 meals daily with  snacks.  Admit weight: 76.9 kg in 10/26 Current weight: 77.7 kg  Pt with mild pitting edema to BLE.  Meal Completion: 50-100%  Medications reviewed and include: ProSource Plus 30 ml QID, colace, Ensure Enlive TID, folic acid, SSI TID with meals, levemir 15 units daily, melatonin, MVI with minerals, protonix, miralax, klor-con 40 mEq x 2, torsemide, vitamin B-12 1000 mcg daily, warfarin, IV abx, heparin drip  Labs reviewed: sodium 131, potassium 3.1, creatinine 0.60, hemoglobin 8.9 CBG's: 100-131 x 24 hours  UOP: 3225 ml x 24 hours I/O's: -42.3 L since admit  Diet Order:   Diet Order             Diet regular Room service appropriate? Yes; Fluid consistency: Thin  Diet effective now                   EDUCATION NEEDS:   Education needs have been addressed  Skin:  Skin Assessment: Skin Integrity Issues: Stage I: coccyx Incisions: chest, L abdomen  Last BM:  04/21/21  Height:   Ht Readings from Last 1 Encounters:  03/07/21 6' (1.829 m)    Weight:   Wt Readings from Last 1 Encounters:  04/24/21 77.7 kg    Ideal Body Weight:  80.9 kg  BMI:  Body mass index is 23.23 kg/m.  Estimated Nutritional Needs:   Kcal:  2300-2500  Protein:  115-130 grams  Fluid:  >/= 2 L    Gustavus Bryant, MS, RD, LDN Inpatient Clinical Dietitian Please see AMiON for contact information.

## 2021-04-24 NOTE — Progress Notes (Addendum)
Patient ID: Brandon [name], male   DOB: 1955-08-16, 65 y.o.   MRN: [id number]      Advanced Heart Failure Rounding Note  PCP-Cardiologist: [name], MD  [facility]: Dr. [name]   Subjective:    Admitted with sepsis. Started on cefepime + vanc, now off abx (ID following). Afebrile.  10/29 Co-ox 48%, started on milrinone 0.25.  10/30 IV Antibiotics stopped. Blood cultures no growth for 5 days.  10/31 started on Augmentin for PNA.  11/1 Milrinone off 11/2 Milrinone added back. Midodrine increased to 10 mg TID 11/10 Developed Afib w/ RVR, converted with IV amio 11/14 Milrinone increased to 0.25 mcg. Diuresed with IV lasix  11/25 Underwent HM-3 VAD placement and bioprosthetic AVR. Had persistent hypotension in OR requiring methylene blue and high dose pressors.  11/26 Extubated 11/28 Started on lasix gtt>>later stopped due to low CVP (1) and increasing pressor requirements. Restarted on scheduled BID IV Lasix once pressure stabilized (still volume up)  11/28 Surgical path with evidence of possible "acute AoV endocarditis". ID consulted. Bcx recollected. Tissue sent to [facility] in [place].   11/30 Started on IV ceftriaxone to cover the Streptococcus gordonae that was isolated before, per ID. 11/30 VAD speed increased to 5600 12/1 Diuresed with IV lasix + metolazone. Milrinone was stopped. Amio drip transitioned to amio 200 mg twice a day.  12/5 Ramp Echo speed increased to 5500. Epi stopped.  12/6 Transferred to [place]. Had BM. Started on spiro 12.5 mg daily 12/8 CXR with significant bilateral pleural effusions. S/p Left Thoracentesis w/ 1.7 L out but still with residual fluid 12/9 Rt Thoracentesis  12/11 Given dose of tolvaptan and diuresed with IV lasix. Negative 2.5 liters.  12/12 Repeat Lt Thoracentesis w/ 1L Fluid removal   Co-ox 63% off inotropes/pressors.   Breathing improved post thoracentesis. On 2L [place].   Volume status ok. Wt down 8 lb in the last 2 days. CVP 6 SCr normal, 0.60. K  3.1  INR 1.5. on heparin gtt + coumadin.   Main complaint today is left shoulder pain. Sharp shooting pain. ROM normal. Pain improved w/ morphine.   Denies chest pain. No fever/ chills. WBC nl, 9.2   LVAD Interrogation HM 3: Speed: 5800 Flow: 5.1 PI: 3.5  Power: 4.8, 3 PI events Parameters stable.    Objective:   Weight Range: 77.7 kg Body mass index is 23.23 kg/m.   Vital Signs:   Temp:  [97.5 F (36.4 C)-97.7 F (36.5 C)] 97.6 F (36.4 C) (12/13 0456) Pulse Rate:  [84-94] 90 (12/13 0904) Resp:  [15-20] 17 (12/13 0456) BP: (81-113)/(62-92) 95/76 (12/13 0904) SpO2:  [97 %-100 %] 98 % (12/13 0904) Weight:  [77.7 kg] 77.7 kg (12/13 0500) Last BM Date: 04/21/21  Weight change: Filed Weights   04/21/21 0651 04/22/21 0417 04/24/21 0500  Weight: 79.2 kg 81.6 kg 77.7 kg    Intake/Output:   Intake/Output Summary (Last 24 hours) at 04/24/2021 1008 Last data filed at 04/24/2021 0753 Gross per 24 hour  Intake 1177.34 ml  Output 2475 ml  Net -1297.66 ml     Physical Exam  Doppler Map 84   CVP 5-6  GENERAL: Thin WM, sitting up in bed. No distress  HEENT: normal  NECK: Supple, JVP 6 cm .  2+ bilaterally, no bruits.  No lymphadenopathy or thyromegaly appreciated.   CARDIAC:  Mechanical heart sounds with LVAD hum present.  LUNGS:  bibasilar crackles  ABDOMEN:  Soft, round, nontender, positive bowel sounds x4.  LVAD exit site: Dressing dry and intact.  No erythema or drainage.  Stabilization device present and accurately applied.  Driveline dressing is being changed daily per sterile technique. EXTREMITIES:  Warm and dry, no cyanosis, clubbing or rash. Trace bilateral pretibial edema, + TED hoses . + RUE PICC  NEUROLOGIC:  Alert and oriented x 3.    No aphasia.  No dysarthria.  Affect pleasant.      Telemetry   NSR 90s. Personally reviewed   Labs    CBC Recent Labs    04/23/21 0435 04/24/21 0445  WBC 10.7* 9.2  HGB 9.6* 8.9*  HCT 32.0* 29.8*  MCV 80.8 80.3   PLT 452* 867   Basic Metabolic Panel Recent Labs    04/23/21 0435 04/24/21 0445  NA 134* 131*  K 3.6 3.1*  CL 90* 88*  CO2 35* 35*  GLUCOSE 127* 109*  BUN 12 12  CREATININE 0.60* 0.60*  CALCIUM 8.9 8.6*   Liver Function Tests No results for input(s): AST, ALT, ALKPHOS, BILITOT, PROT, ALBUMIN in the last 72 hours.    No results for input(s): LIPASE, AMYLASE in the last 72 hours. Cardiac Enzymes No results for input(s): CKTOTAL, CKMB, CKMBINDEX, TROPONINI in the last 72 hours.  BNP: BNP (last 3 results) Recent Labs    04/07/21 0500 04/13/21 0333 04/20/21 0600  BNP 663.2* 438.2* 193.0*    ProBNP (last 3 results) No results for input(s): PROBNP in the last 8760 hours.    D-Dimer No results for input(s): DDIMER in the last 72 hours. Hemoglobin A1C No results for input(s): HGBA1C in the last 72 hours.  Fasting Lipid Panel No results for input(s): CHOL, HDL, LDLCALC, TRIG, CHOLHDL, LDLDIRECT in the last 72 hours. Thyroid Function Tests No results for input(s): TSH, T4TOTAL, T3FREE, THYROIDAB in the last 72 hours.  Invalid input(s): FREET3  Other results:   Imaging    DG Chest 1 View  Result Date: 04/23/2021 CLINICAL DATA:  History of CHF, now with symptomatic bilateral pleural effusions. Post left-sided thoracentesis. EXAM: CHEST  1 VIEW COMPARISON:  04/23/2021; 04/20/2021; 04/19/2021; 04/18/2021 Ultrasound-guided right-sided thoracentesis-04/20/2021 Ultrasound-guided left-sided thoracentesis-04/19/2021 FINDINGS: Unchanged cardiac silhouette and mediastinal contours post median sternotomy and valve replacement. LVAD device again overlies the left cardiac apex. Right upper extremity approach PICC line tip overlies the superior cavoatrial junction. Neurostimulator device overlies the right upper chest. Interval reduction in persistent trace left-sided effusion post thoracentesis. Improved aeration of the left lung base with minimal residual left basilar  opacities, favored to represent atelectasis. No pneumothorax. Unchanged trace/small right-sided effusion. Pulmonary vasculature remains indistinct with cephalization of flow. No acute osseous abnormalities. IMPRESSION: 1. Interval reduction in persistent trace left-sided effusion post thoracentesis. No pneumothorax. 2. Improved aeration of left lung base with persistent left basilar opacities, likely atelectasis. 3. Otherwise, similar findings of cardiomegaly, pulmonary edema and unchanged trace/small right-sided effusion and right basilar opacities, likely atelectasis. 4. Stable positioning of support apparatus as above. Electronically Signed   By: Sandi Mariscal M.D.   On: 04/23/2021 12:18   IR THORACENTESIS ASP PLEURAL SPACE W/IMG GUIDE  Result Date: 04/23/2021 INDICATION: Recurrent pleural effusion, shortness of breath EXAM: ULTRASOUND GUIDED LEFT THORACENTESIS MEDICATIONS: None. COMPLICATIONS: None immediate. PROCEDURE: An ultrasound guided thoracentesis was thoroughly discussed with the patient and questions answered. The benefits, risks, alternatives and complications were also discussed. The patient understands and wishes to proceed with the procedure. Written consent was obtained. Ultrasound was performed to localize and mark an adequate pocket of fluid in the  left chest. The area was then prepped and draped in the normal sterile fashion. 1% Lidocaine was used for local anesthesia. Under ultrasound guidance a 6 Fr Safe-T-Centesis catheter was introduced. Thoracentesis was performed. The catheter was removed and a dressing applied. FINDINGS: A total of approximately 1 L of amber fluid was removed. IMPRESSION: Successful ultrasound guided left thoracentesis yielding 1 L of pleural fluid. The procedure was performed by Pasty Spillers, NP. Electronically Signed   By: Sandi Mariscal M.D.   On: 04/23/2021 12:20     Medications:     Scheduled Medications:  (feeding supplement) PROSource Plus  30 mL Oral TID  PC & HS   sodium chloride   Intravenous Once   amiodarone  200 mg Oral Daily   aspirin EC  81 mg Oral Daily   Chlorhexidine Gluconate Cloth  6 each Topical Daily   digoxin  0.125 mg Oral Daily   docusate sodium  200 mg Oral Daily   doxycycline  100 mg Oral Q12H   eplerenone  25 mg Oral Daily   ezetimibe  10 mg Oral Daily   feeding supplement  237 mL Oral TID BM   folic acid  1 mg Oral Daily   gabapentin  200 mg Oral TID   insulin aspart  0-15 Units Subcutaneous TID WC   insulin detemir  15 Units Subcutaneous Daily   mouth rinse  15 mL Mouth Rinse BID   melatonin  5 mg Oral QHS   midodrine  10 mg Oral TID WC   multivitamin with minerals  1 tablet Oral Daily   pantoprazole  40 mg Oral Daily   polyethylene glycol  17 g Oral Daily   potassium chloride  40 mEq Oral Q4H   sertraline  100 mg Oral Daily   tamsulosin  0.4 mg Oral Daily   torsemide  40 mg Oral BID   vitamin B-12  100 mcg Oral Daily   Warfarin - Pharmacist Dosing Inpatient   Does not apply q1600    Infusions:  sodium chloride Stopped (04/14/21 1149)   cefTRIAXone (ROCEPHIN)  IV 2 g (04/24/21 0859)   heparin 400 Units/hr (04/24/21 0529)   lactated ringers Stopped (04/13/21 1053)     PRN Medications: sodium chloride, bisacodyl **OR** bisacodyl, lidocaine, morphine injection, ondansetron (ZOFRAN) IV, oxyCODONE, polyvinyl alcohol, sodium chloride flush, traMADol    Patient Profile   Mr Rinkenberger is a 65 year old with history of smoker, CAD s/p previous MI, HTN, HL, chronic HFrEF, sepsis 11/2020 bacteremia.  Admitted with sepsis. Bld Cx - NGTD    Assessment/Plan   1. Acute on chronic systolic HFr EF -> cardiogenic shock - Echo 05/19/20 EF 20-25% RV mildly HK. moderate AS  Mean gradient 13 AVA 1.2 cm2 DI 0.30 - Persistent NYHA IV. Admitted with shock - s/p HM-III VAD + bioprosthetic AVR on 11/25 - RV looked good on TEE in OR - Off pressors. Co-ox 63%  - CVP 6. Volume status improved.  - Continue torsemide 40 mg  twice a d ay.  - MAP 80-90s. On midodrine 10 tid. Will try weaning, reduce to 5 tid and monitor  - Continue digoxin 0.125.  - Continue eplerenone 25 daily - Renal function stable.   2. HM-3 LVAD implant - Plan as above - VAD interrogated personally. Parameters stable. - INR 1.5  Continue ASA 81 for 30 days. Stop ASA 05/14/21  - continue low dose heparin.  - LDH stable  - Had ramp ECHO speed increased to  5800. Rare PI events.   3. Acute Hypoxic Respiratory Failure  - Extubated 11/26, stable on 2L [place].    4. 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 CAD  s/p anterior STEMI (12/20). LHC showed chronically occluded RCA (with L>>R collaterals) and thrombotic occlusion of proximal LAD. Underwent PCI of LAD. - No s/s angina - Continue atorvastatin 80.   6. Iron-deficiency anemia & ABLA - Rec'd Feraheme 11/8 and 11/13  - Keep hgb > 8.0,  8.9 today,  - Rec'd 1 unit PRBCs on 12/10 - 04/23/21 Iron sats low. Give feraheme  7. Hypokalemia/ Hypomagnesemia  - Keep K > 4.0 Mg > 2.0  - K 3.1 today. Supp K   8. Severe protein-calorie malnutrition - prealbumin 11>15> 13 >6.6 - Continue aggressive nutritional support  9. Paroxysmal Atrial Fibrillation w/ RVR - new, developed 11/10. Maintaining SR/ST    - Off amio drip. Continue amio 200 mg once daily  - on warfarin. INR 1.5  today.   10. Hyponatremia - sodium 131>133>130-> 125>134 >131  - Sodium up after tolvaptan.  - monitor   11. Sepsis - H/o strep bacteremia 11/2020.  ICD extracted 12/04/20. Barostim was not removed as it is extravascular.  - Received IV antibiotics until 01/15/21. Blood cultures negative 01/30/21..  - 10/26 -Blood CX x2  Negative  - Ct chest concerning for pneumonia. 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 Hemorrhagic Pericarditis - Fibrin clot encasing heart at time of surgery.  Will need to be careful with anticoagulation (go slowly) - PharmD managing.   3. 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 - Ceftriaxone 2 g IV daily x 6 weeks. End Date: May 22, 2021  14. Constipation - Resolved.    15. Bilateral pleural effusions - S/p Left thoracentesis 12/8 but still with residual fluid. Will likely need repeat L tap in a few days - R thoracentesis 12/9.  - S/p repeat left thoracentesis 12/12 w/ 1L Fluid removal  - encouraged to use IS   16. Left Shoulder Pain - post thoracentesis. Post procedure CXR negative for PTX  - AF. Normal WBCs - suspect musculoskeletal etiology. Normal AROM  - low suspicion for gout - monitor and continue supportive care for now - PRN tylenol, heating pad    HHPT/HHRN ordered. Home antibiotics. Carolynn Sayers aware.    Length of Stay: [address], PA-C  04/24/2021, 10:08 AM  Advanced Heart Failure Team Pager [phone] (M-F; 7a - 5p)  Please contact [facility] for night-coverage after hours (5p -7a ) and weekends on [email or web address]  Patient seen and examined with the above-signed Advanced Practice Provider and/or Housestaff. I personally reviewed laboratory data, imaging studies and relevant notes. I independently examined the patient and formulated the important aspects of the  plan. I  have edited the note to reflect any of my changes or salient points. I have personally discussed the plan with the patient and/or family.  Underwent repeat left thoracentesis yesterday with 1L out. Breathing better but chest still sore with deep inspiration. Diuresing well oral torsemide. On heparin due to low INR. No bleeding. VAD interrogated personally. Parameters stable.   General:  NAD.  HEENT: normal  Neck: supple. JVP not elevated.  Carotids 2+ bilat; no bruits. No lymphadenopathy or thryomegaly appreciated. Cor: LVAD hum.  Lungs: Decreased bot bases (improved)  Abdomen: obese soft, nontender, non-distended. No hepatosplenomegaly. No bruits or masses. Good bowel sounds. Driveline site clean. Anchor in place.  Extremities: no cyanosis, clubbing, rash. Warm 1-2+ edema above TED Neuro: alert & oriented x 3. No focal deficits. Moves all 4 without problem   Breathing improved s/p thoracentesis. Diuresing well with torsemide. On heparin for low INR.   Can stop heparin when INR 1.7 or greater. VAD interrogated personally. Parameters stable. Continue to ambulate. Watch for re-occurrence of effusions.   Glori Bickers, MD  2:46 PM

## 2021-04-24 NOTE — Plan of Care (Signed)
°  Problem: Education: Goal: Knowledge of General Education information will improve Description: Including pain rating scale, medication(s)/side effects and non-pharmacologic comfort measures Outcome: Progressing   Problem: Health Behavior/Discharge Planning: Goal: Ability to manage health-related needs will improve Outcome: Progressing   Problem: Clinical Measurements: Goal: Ability to maintain clinical measurements within normal limits will improve Outcome: Progressing Goal: Will remain free from infection Outcome: Progressing Goal: Diagnostic test results will improve Outcome: Progressing Goal: Respiratory complications will improve Outcome: Progressing Goal: Cardiovascular complication will be avoided Outcome: Progressing   Problem: Activity: Goal: Risk for activity intolerance will decrease Outcome: Progressing   Problem: Nutrition: Goal: Adequate nutrition will be maintained Outcome: Progressing   Problem: Coping: Goal: Level of anxiety will decrease Outcome: Progressing   Problem: Elimination: Goal: Will not experience complications related to bowel motility Outcome: Progressing Goal: Will not experience complications related to urinary retention Outcome: Progressing   Problem: Pain Managment: Goal: General experience of comfort will improve Outcome: Progressing   Problem: Safety: Goal: Ability to remain free from injury will improve Outcome: Progressing   Problem: Skin Integrity: Goal: Risk for impaired skin integrity will decrease Outcome: Progressing   Problem: Activity: Goal: Capacity to carry out activities will improve Outcome: Progressing

## 2021-04-24 NOTE — Progress Notes (Addendum)
[place] for warfarin + heparin Indication:  s/p VAD (HM3) , afib, hx DVT  No Active Allergies  Patient Measurements: Height: 6' (182.9 cm) Weight: 77.7 kg (171 lb 4.8 oz) IBW/kg (Calculated) : 77.6   Vital Signs: Temp: 97.6 F (36.4 C) (12/13 0456) Temp Source: Oral (12/13 0456) BP: 95/76 (12/13 0904) Pulse Rate: 90 (12/13 0904)  Labs: Recent Labs    04/22/21 0450 04/22/21 1800 04/23/21 0435 04/24/21 0445  HGB 8.9*  --  9.6* 8.9*  HCT 28.5*  --  32.0* 29.8*  PLT 444*  --  452* 389  LABPROT 17.9*  --  16.9* 18.2*  INR 1.5*  --  1.4* 1.5*  HEPARINUNFRC  --   --   --  <0.10*  CREATININE 0.63 0.60* 0.60* 0.60*     Estimated Creatinine Clearance: 98.3 mL/min (A) (by C-G formula based on SCr of 0.6 mg/dL (L)).   Medical History: Past Medical History:  Diagnosis Date   AICD (automatic cardioverter/defibrillator) present 08/31/2019   Ankylosing spondylitis ([place])    Arthritis    BENIGN PROSTATIC HYPERTROPHY 06/07/2008   CHF (congestive heart failure) ([place])    COLONIC POLYPS, HX OF 06/07/2008   Depression    H/O hiatal hernia    Heart failure ([place])    HYPERLIPIDEMIA 06/07/2008   HYPERTENSION 06/07/2008   Myocardial infarction [facility]) 2005   NSTEMI, s/p LAD stent   NEPHROLITHIASIS, HX OF 06/07/2008   STEMI (ST elevation myocardial infarction) ([place]) 04/27/2019    Assessment: 65 yo M s/p HM 3 and AVR on 04/06/21. Prior to surgery/admit patient was on apixiban for a DVT.  Pt s/p thora 12/8, 12/9, and 12/12. INR subtherapeutic at 1.4 so heparin started at low-dose. INR this morning remains subtherapeutic at 1.5, CBC and LDH stable, heparin level undetectable as expected.  Goal of Therapy:  Warfarin INR 2.0-2.5 Heparin Level <0.3 Monitor platelets by anticoagulation protocol: Yes   Plan:  Warfarin 2.5 mg PO x1 again tonight Heparin 400 units/h - no titrations Daily INR, heparin level, CBC  Arrie Senate, PharmD, [place],  [facility] Clinical Pharmacist [phone] Please check AMION for all [place] numbers 04/24/2021

## 2021-04-25 LAB — CBC
HCT: 27.6 % — ABNORMAL LOW (ref 39.0–52.0)
Hemoglobin: 8.4 g/dL — ABNORMAL LOW (ref 13.0–17.0)
MCH: 24.6 pg — ABNORMAL LOW (ref 26.0–34.0)
MCHC: 30.4 g/dL (ref 30.0–36.0)
MCV: 80.7 fL (ref 80.0–100.0)
Platelets: 403 10*3/uL — ABNORMAL HIGH (ref 150–400)
RBC: 3.42 MIL/uL — ABNORMAL LOW (ref 4.22–5.81)
RDW: 18.6 % — ABNORMAL HIGH (ref 11.5–15.5)
WBC: 8.2 10*3/uL (ref 4.0–10.5)
nRBC: 0 % (ref 0.0–0.2)

## 2021-04-25 LAB — COOXEMETRY PANEL
Carboxyhemoglobin: 1.9 % — ABNORMAL HIGH (ref 0.5–1.5)
Methemoglobin: 0.9 % (ref 0.0–1.5)
O2 Saturation: 69.5 %
Total hemoglobin: 8.2 g/dL — ABNORMAL LOW (ref 12.0–16.0)

## 2021-04-25 LAB — GLUCOSE, CAPILLARY
Glucose-Capillary: 105 mg/dL — ABNORMAL HIGH (ref 70–99)
Glucose-Capillary: 77 mg/dL (ref 70–99)
Glucose-Capillary: 121 mg/dL — ABNORMAL HIGH (ref 70–99)
Glucose-Capillary: 97 mg/dL (ref 70–99)

## 2021-04-25 LAB — HEPARIN LEVEL (UNFRACTIONATED): Heparin Unfractionated: <0.1 IU/mL — ABNORMAL LOW (ref 0.30–0.70)

## 2021-04-25 LAB — LACTATE DEHYDROGENASE: LDH: 120 U/L (ref 98–192)

## 2021-04-25 LAB — BASIC METABOLIC PANEL
Anion gap: 6 (ref 5–15)
BUN: 12 mg/dL (ref 8–23)
CO2: 37 mmol/L — ABNORMAL HIGH (ref 22–32)
Calcium: 8.5 mg/dL — ABNORMAL LOW (ref 8.9–10.3)
Chloride: 85 mmol/L — ABNORMAL LOW (ref 98–111)
Creatinine, Ser: 0.59 mg/dL — ABNORMAL LOW (ref 0.61–1.24)
GFR, Estimated: >60 mL/min (ref >60)
Glucose, Bld: 82 mg/dL (ref 70–99)
Potassium: 3.4 mmol/L — ABNORMAL LOW (ref 3.5–5.1)
Sodium: 128 mmol/L — ABNORMAL LOW (ref 135–145)

## 2021-04-25 LAB — PROTIME-INR
INR: 1.5 — ABNORMAL HIGH (ref 0.8–1.2)
Prothrombin Time: 18.4 seconds — ABNORMAL HIGH (ref 11.4–15.2)

## 2021-04-25 LAB — MAGNESIUM: Magnesium: 1.7 mg/dL (ref 1.7–2.4)

## 2021-04-25 LAB — DIGOXIN LEVEL: Digoxin Level: 0.6 ng/mL — ABNORMAL LOW (ref 0.8–2.0)

## 2021-04-25 MED ORDER — MAGNESIUM SULFATE 4 GM/100ML IV SOLN
4.0000 g | Freq: Once | INTRAVENOUS | Status: AC
  Administered 2021-04-25: 11:00:00 4 g via INTRAVENOUS
  Filled 2021-04-25: qty 100

## 2021-04-25 MED ORDER — WARFARIN SODIUM 4 MG PO TABS
4.0000 mg | ORAL_TABLET | Freq: Once | ORAL | Status: AC
  Administered 2021-04-25: 17:00:00 4 mg via ORAL
  Filled 2021-04-25: qty 1

## 2021-04-25 MED ORDER — POTASSIUM CHLORIDE CRYS ER 20 MEQ PO TBCR
40.0000 meq | EXTENDED_RELEASE_TABLET | ORAL | Status: AC
  Administered 2021-04-25 (×2): 40 meq via ORAL
  Filled 2021-04-25 (×2): qty 2

## 2021-04-25 MED ORDER — ADULT MULTIVITAMIN W/MINERALS CH
1.0000 | ORAL_TABLET | Freq: Every day | ORAL | Status: DC
  Administered 2021-04-26 – 2021-05-08 (×13): 1 via ORAL
  Filled 2021-04-25 (×13): qty 1

## 2021-04-25 MED ORDER — ATORVASTATIN CALCIUM 80 MG PO TABS
80.0000 mg | ORAL_TABLET | Freq: Every day | ORAL | Status: DC
  Administered 2021-04-25 – 2021-05-09 (×15): 80 mg via ORAL
  Filled 2021-04-25 (×15): qty 1

## 2021-04-25 NOTE — Plan of Care (Signed)

## 2021-04-25 NOTE — Progress Notes (Signed)
ANTICOAGULATION CONSULT NOTE   Pharmacy Consult for warfarin + heparin Indication:  s/p VAD (HM3) , afib, hx DVT  No Active Allergies  Patient Measurements: Height: 6' (182.9 cm) Weight: 79.1 kg (174 lb 6.1 oz) IBW/kg (Calculated) : 77.6   Vital Signs: Temp: 97.8 F (36.6 C) (12/14 0833) Temp Source: Oral (12/14 0833) BP: 79/68 (12/14 0841) Pulse Rate: 91 (12/14 0833)  Labs: Recent Labs    04/23/21 0435 04/24/21 0445 04/25/21 0400  HGB 9.6* 8.9* 8.4*  HCT 32.0* 29.8* 27.6*  PLT 452* 389 403*  LABPROT 16.9* 18.2* 18.4*  INR 1.4* 1.5* 1.5*  HEPARINUNFRC  --  <0.10* <0.10*  CREATININE 0.60* 0.60* 0.59*     Estimated Creatinine Clearance: 98.3 mL/min (A) (by C-G formula based on SCr of 0.59 mg/dL (L)).   Medical History: Past Medical History:  Diagnosis Date   AICD (automatic cardioverter/defibrillator) present 08/31/2019   Ankylosing spondylitis ([place])    Arthritis    BENIGN PROSTATIC HYPERTROPHY 06/07/2008   CHF (congestive heart failure) ([place])    COLONIC POLYPS, HX OF 06/07/2008   Depression    H/O hiatal hernia    Heart failure ([place])    HYPERLIPIDEMIA 06/07/2008   HYPERTENSION 06/07/2008   Myocardial infarction [facility]) 2005   NSTEMI, s/p LAD stent   NEPHROLITHIASIS, HX OF 06/07/2008   STEMI (ST elevation myocardial infarction) ([place]) 04/27/2019    Assessment: 65 yo M s/p HM 3 and AVR on 04/06/21. Prior to surgery/admit patient was on apixiban for a DVT.  Pt s/p thora 12/8, 12/9, and 12/12. INR subtherapeutic at so heparin started at low-dose on 12/12. INR this morning remains subtherapeutic at 1.5, CBC and LDH stable, heparin level undetectable as expected.  Goal of Therapy:  Warfarin INR 2.0-2.5 Heparin Level <0.3 Monitor platelets by anticoagulation protocol: Yes   Plan:  Warfarin 4mg  PO x1 tonight Heparin 400 units/h - no titrations Daily INR, heparin level, CBC, LDH   Arrie Senate, PharmD, [place], [facility] Clinical Pharmacist [phone] Please check  AMION for all [place] numbers 04/25/2021

## 2021-04-25 NOTE — Progress Notes (Signed)
Physical Therapy Treatment Patient Details Name: Brandon [name] MRN: [id number] DOB: November 21, 1955 Today's Date: 04/25/2021   History of Present Illness Pt is a 65 y.o. male admitted 03/07/21 with acute on chronic hypoxemic respiratory failure, cardiogenic shock. CT scan which shows bronchopneumonia and no indication of ILD. Prolonged admission undergoing workup for VAD. S/p LVAD placement 11/25. CXR 12/8 with significant bilateral pleural effusions. S/p L thoracentesis 12/8 and 12/12.  S/p R thoracentesis 12/9. Other PMH includes CHF, ankylosing spondylitis, bacteremia and  possible endocarditis s/p ICD removal, HTN, depression, arthritis.    PT Comments    Pt tolerates treatment well but demonstrates significant impairment in safety awareness and recall of LVAD management. Pt impulsively standing and ambulating to bathroom, requiring PT cues to stop mobility multiple times due to impaired management of LVAD wires and hardware. Pt reports feeling as if he needs to rush through LVAD device management to impress staff, PT provides reassurance that slower pace with improved accuracy is much more desirable. Pt will benefit from continued intensive education on management of LVAD equipment and lines to improve safety and reduce risk of adverse events.  Recommendations for follow up therapy are one component of a multi-disciplinary discharge planning process, led by the attending physician.  Recommendations may be updated based on patient status, additional functional criteria and insurance authorization.  Follow Up Recommendations  Home health PT     Assistance Recommended at Discharge Frequent or constant Supervision/Assistance  Equipment Recommendations  Rolling walker (2 wheels) (may progress to no needs as pt owns 4 wheeled walker)    Recommendations for Other Services       Precautions / Restrictions Precautions Precautions: Fall;Sternal;Other (comment) Precaution Comments:  LVAD Restrictions Weight Bearing Restrictions: No Other Position/Activity Restrictions: sternal precautions     Mobility  Bed Mobility Overal bed mobility: Needs Assistance Bed Mobility: Supine to Sit     Supine to sit: Supervision          Transfers Overall transfer level: Needs assistance Equipment used: None;Rolling walker (2 wheels) Transfers: Sit to/from Stand Sit to Stand: Supervision                Ambulation/Gait Ambulation/Gait assistance: Supervision;Min guard Gait Distance (Feet): 200 Feet (additional 15' x 2 without walker to/from bathroom) Assistive device: Rolling walker (2 wheels);None Gait Pattern/deviations: Step-through pattern Gait velocity: reduced Gait velocity interpretation: <1.8 ft/sec, indicate of risk for recurrent falls   General Gait Details: pt with steady step-through gait with use of RW, pt with increased lateral sway, reaching for UE support of fruniture or walls when mobilizing without walker   Stairs             Wheelchair Mobility    Modified Rankin (Stroke Patients Only)       Balance Overall balance assessment: Needs assistance Sitting-balance support: No upper extremity supported;Feet supported Sitting balance-Leahy Scale: Fair     Standing balance support: No upper extremity supported;During functional activity Standing balance-Leahy Scale: Poor Standing balance comment: pt reaching for UE support of wall/furniture to maintain balance                            Cognition Arousal/Alertness: Awake/alert Behavior During Therapy: Impulsive Overall Cognitive Status: Impaired/Different from baseline Area of Impairment: Attention;Memory;Safety/judgement;Awareness;Problem solving                   Current Attention Level: Sustained Memory: Decreased short-term memory;Decreased recall of precautions   Safety/Judgement:  Decreased awareness of safety Awareness: Emergent Problem Solving:  Requires verbal cues;Difficulty sequencing          Exercises      General Comments General comments (skin integrity, edema, etc.): tachycardia into low 100s observed. Pt on 2L [place] during mobility and at rest. Pt impulsively stands and walks to bathroom, initially reporting to PT that he has ambulated to bathroom while on main power supply but later reporting it was his first time. Pt with no recall of needing to place LVAD controller strap around neck. Pt requiring multiple verbal cues and minA to manage switching power sources to batteries. Pt requiring assistance to don vest without twisting cables and appling tension to driveline.      Pertinent Vitals/Pain Pain Assessment: Faces Faces Pain Scale: Hurts even more Pain Location: L side Pain Descriptors / Indicators: Stabbing Pain Intervention(s): Monitored during session    Home Living                          Prior Function            PT Goals (current goals can now be found in the care plan section) Acute Rehab PT Goals Patient Stated Goal: return to walking without a device Progress towards PT goals: Not progressing toward goals - comment (poor recall of LVAD management and impaired safety awareness)    Frequency    Min 3X/week      PT Plan Current plan remains appropriate    Co-evaluation              AM-PAC PT "6 Clicks" Mobility   Outcome Measure  Help needed turning from your back to your side while in a flat bed without using bedrails?: None Help needed moving from lying on your back to sitting on the side of a flat bed without using bedrails?: A Little Help needed moving to and from a bed to a chair (including a wheelchair)?: A Little Help needed standing up from a chair using your arms (e.g., wheelchair or bedside chair)?: A Little Help needed to walk in hospital room?: A Little Help needed climbing 3-5 steps with a railing? : A Little 6 Click Score: 19    End of Session Equipment  Utilized During Treatment: Oxygen Activity Tolerance: Patient tolerated treatment well Patient left: in chair;with call bell/phone within reach Nurse Communication: Mobility status PT Visit Diagnosis: Difficulty in walking, not elsewhere classified (R26.2);Muscle weakness (generalized) (M62.81)     Time: 4665-9935 PT Time Calculation (min) (ACUTE ONLY): 32 min  Charges:  $Gait Training: 8-22 mins $Therapeutic Activity: 8-22 mins                     Zenaida Niece, PT, DPT Acute Rehabilitation Pager: [phone] Office [place] 04/25/2021, 11:32 AM

## 2021-04-25 NOTE — Progress Notes (Signed)
LVAD Coordinator Rounding Note:  Admitted 03/07/21 due to sepsis. Dr. [name] consulted as his HF Cardiologist.   HM III LVAD implanted on 04/06/21 by Dr. Cyndia Bent under Destination Therapy criteria. Not a transplant candidate at this time due to recurrent infections.   Pt sitting up in bed upon my arrival. Reports SOB is better after right/left thoracentesis last week. Reports pain is much improved this morning. Pt tells me that he is able to change his power sources but that he does have to stop and think about the connections and sometimes he gets confused on which cable to connect. Nursing will practice with the pt today when he is up walking etc  Antibiotics per ID for possible AV endocarditis. Per ID note on 04/11/21 he will need 6 weeks IV antibiotics with end date 05/22/21. Currently receiving Ceftriaxone daily. Per ID tissue specimen sent to [facility] for 16 S ribosomal sequencing per PCR- results pending. Advanced home health and [facility] set up for medication/PICC maintenance at hospital discharge per case manager. Will need ID appt rescheduled at discharge.   Patient's home equipment arrived. Patient and family VAD discharge education complete.   Vital signs: Temp: 97.8 HR: 91 Doppler: 76 Automatic BP: 79/68 (73) O2 Sat: 99% on 2 L/[place] Wt:171.5>190>187.4>187>...177.6>176.15>174.8>171.3>174.3 lbs     LVAD interrogation reveals:  Speed: 5800 Flow: 5 Power: 4.8w PI: 3.5  Alarms: none Events: none Hematocrit: 27  Fixed speed: 5800 Low speed limit: 5500  Drive Line:  CDI. Drive line anchor secure. Continue twice a week dressing changes on Monday and Thursday per VAD Coordinator, Nurse Davonna Belling, or Tomi Bamberger. Next dressing change due 04/26/21.     Labs:  LDH trend: 309>248>215>206>201>167>165>158>161>193>160>141>115>120  INR trend: 1.6>1.3>1.3>1.4>1.5>1.6>1.8>2.0>2.0>2.0>1.8>1.7>1.4>1.5  Anticoagulation Plan: -INR Goal: 2 - 2.5 -ASA Dose: 81 mg daily  Blood  Products:   Intra-op: - 04/06/21>>4 PRBCs, 4 FFP, 1263 cc cell saver  Post-op: - 04/06/21>>1 unit Plts - 04/07/21>>1 unit PRBC  Device: N/A  Arrythmias: 03/22/21 - developed Afib w/ RVR, converted with IV amiodarone  Respiratory: extubated 04/07/21  Nitric Oxide: off 04/07/21  Gtts: Heparin 400 u/hr  Infection: - 04/11/21 Blood cultures x 2>> no growth x 5 days; final - 04/19/21 Body fluid cx (left lung)>> no growth <24 hours - 04/19/21 Gram stain (left lung)>> no WBC or organisms seen; final - 04/20/21 Gram stain (right lung)>> rare WBC, no organisms seen; final  Patient/Family Teaching:  VAD discharge teaching completed with patient, his sister [name], and his friend Tomi Bamberger. See separate note for documentation.  Marciaable to do dressing changes with bedside nurse.  Plan/Recommendations:  1. Call VAD Coordinator for any VAD equipment or drive line issues. 2.  Daily dressing changes per VAD Coordinator, Nurse Davonna Belling, or trained caregiver. 3. VAD discharge teaching completed with patient and family.   Tanda Rockers RN [place] Coordinator  Office: [phone]  24/7 Pager: [phone]

## 2021-04-25 NOTE — Progress Notes (Addendum)
Patient ID: Brandon [name], male   DOB: Sep 12, 1955, 65 y.o.   MRN: [id number]      Advanced Heart Failure Rounding Note  PCP-Cardiologist: [name], MD  [facility]: Dr. [name]   Subjective:    Admitted with sepsis. Started on cefepime + vanc, now off abx (ID following). Afebrile.  10/29 Co-ox 48%, started on milrinone 0.25.  10/30 IV Antibiotics stopped. Blood cultures no growth for 5 days.  10/31 started on Augmentin for PNA.  11/1 Milrinone off 11/2 Milrinone added back. Midodrine increased to 10 mg TID 11/10 Developed Afib w/ RVR, converted with IV amio 11/14 Milrinone increased to 0.25 mcg. Diuresed with IV lasix  11/25 Underwent HM-3 VAD placement and bioprosthetic AVR. Had persistent hypotension in OR requiring methylene blue and high dose pressors.  11/26 Extubated 11/28 Started on lasix gtt>>later stopped due to low CVP (1) and increasing pressor requirements. Restarted on scheduled BID IV Lasix once pressure stabilized (still volume up)  11/28 Surgical path with evidence of possible "acute AoV endocarditis". ID consulted. Bcx recollected. Tissue sent to [facility] in [place].   11/30 Started on IV ceftriaxone to cover the Streptococcus gordonae that was isolated before, per ID. 11/30 VAD speed increased to 5600 12/1 Diuresed with IV lasix + metolazone. Milrinone was stopped. Amio drip transitioned to amio 200 mg twice a day.  12/5 Ramp Echo speed increased to 5500. Epi stopped.  12/6 Transferred to [place]. Had BM. Started on spiro 12.5 mg daily 12/8 CXR with significant bilateral pleural effusions. S/p Left Thoracentesis w/ 1.7 L out but still with residual fluid 12/9 Rt Thoracentesis  12/11 Given dose of tolvaptan and diuresed with IV lasix. Negative 2.5 liters.  12/12 Repeat Lt Thoracentesis w/ 1L Fluid removal   Co-ox 70% off inotropes/pressors.   Breathing improved post thoracentesis.   Wt up 3 lb. CVP 7-8  Na trending back down, 134>>131>>128 CO2 elevated 37 K 3.4 Scr 0.59    INR 1.5. on heparin gtt + coumadin.  Hgb 9.6>>8.9>>8.4   Overall feels "much better" today. Breathing much improved. Left shoulder pain resolved. No chest pain. LEE much improved.    LVAD Interrogation HM 3: Speed: 5800 Flow: 5.1 PI: 3.6  Power: 4.7,  No PI events today Parameters stable.    Objective:   Weight Range: 79.1 kg Body mass index is 23.65 kg/m.   Vital Signs:   Temp:  [97.8 F (36.6 C)-98.2 F (36.8 C)] 97.8 F (36.6 C) (12/14 0833) Pulse Rate:  [85-93] 91 (12/14 0833) Resp:  [17-20] 18 (12/14 0456) BP: (71-93)/(51-74) 79/68 (12/14 0841) SpO2:  [97 %-99 %] 99 % (12/14 0456) Weight:  [79.1 kg] 79.1 kg (12/14 0456) Last BM Date: 04/21/21  Weight change: Filed Weights   04/22/21 0417 04/24/21 0500 04/25/21 0456  Weight: 81.6 kg 77.7 kg 79.1 kg    Intake/Output:   Intake/Output Summary (Last 24 hours) at 04/25/2021 0904 Last data filed at 04/25/2021 0631 Gross per 24 hour  Intake 679.84 ml  Output 1100 ml  Net -420.16 ml     Physical Exam  Doppler Map 70s  CVP 7  GENERAL: Well appearing, sitting up in bed. No distress  HEENT: normal  NECK: Supple, JVD 7 cm,.  2+ bilaterally, no bruits.  No lymphadenopathy or thyromegaly appreciated.   CARDIAC:  Mechanical heart sounds with LVAD hum present.  LUNGS:  clear on the left, RLL + crackles. No wheezing  ABDOMEN:  Soft, round, nontender, positive bowel sounds x4.  LVAD exit site: Dressing dry and intact.  No erythema or drainage.  Stabilization device present and accurately applied.  Driveline dressing is being changed daily per sterile technique. EXTREMITIES:  Warm and dry, no cyanosis, clubbing or rash. Trace bilateral ankle edema + TED hoses . + RUE PICC  NEUROLOGIC:  Alert and oriented x 3.    No aphasia.  No dysarthria.  Affect pleasant.      Telemetry   NSR 90s. Personally reviewed   Labs    CBC Recent Labs    04/24/21 0445 04/25/21 0400  WBC 9.2 8.2  HGB 8.9* 8.4*  HCT 29.8* 27.6*   MCV 80.3 80.7  PLT 389 315*   Basic Metabolic Panel Recent Labs    04/24/21 0445 04/25/21 0400  NA 131* 128*  K 3.1* 3.4*  CL 88* 85*  CO2 35* 37*  GLUCOSE 109* 82  BUN 12 12  CREATININE 0.60* 0.59*  CALCIUM 8.6* 8.5*  MG  --  1.7   Liver Function Tests No results for input(s): AST, ALT, ALKPHOS, BILITOT, PROT, ALBUMIN in the last 72 hours.    No results for input(s): LIPASE, AMYLASE in the last 72 hours. Cardiac Enzymes No results for input(s): CKTOTAL, CKMB, CKMBINDEX, TROPONINI in the last 72 hours.  BNP: BNP (last 3 results) Recent Labs    04/07/21 0500 04/13/21 0333 04/20/21 0600  BNP 663.2* 438.2* 193.0*    ProBNP (last 3 results) No results for input(s): PROBNP in the last 8760 hours.    D-Dimer No results for input(s): DDIMER in the last 72 hours. Hemoglobin A1C No results for input(s): HGBA1C in the last 72 hours.  Fasting Lipid Panel No results for input(s): CHOL, HDL, LDLCALC, TRIG, CHOLHDL, LDLDIRECT in the last 72 hours. Thyroid Function Tests No results for input(s): TSH, T4TOTAL, T3FREE, THYROIDAB in the last 72 hours.  Invalid input(s): FREET3  Other results:   Imaging    No results found.   Medications:     Scheduled Medications:  (feeding supplement) PROSource Plus  30 mL Oral TID PC & HS   sodium chloride   Intravenous Once   amiodarone  200 mg Oral Daily   aspirin EC  81 mg Oral Daily   Chlorhexidine Gluconate Cloth  6 each Topical Daily   digoxin  0.125 mg Oral Daily   docusate sodium  200 mg Oral Daily   doxycycline  100 mg Oral Q12H   eplerenone  25 mg Oral Daily   ezetimibe  10 mg Oral Daily   feeding supplement  237 mL Oral TID BM   folic acid  1 mg Oral Daily   gabapentin  300 mg Oral TID   insulin aspart  0-15 Units Subcutaneous TID WC   insulin detemir  15 Units Subcutaneous Daily   mouth rinse  15 mL Mouth Rinse BID   melatonin  5 mg Oral QHS   midodrine  5 mg Oral TID WC   multivitamin with minerals   1 tablet Oral Daily   pantoprazole  40 mg Oral Daily   polyethylene glycol  17 g Oral Daily   sertraline  100 mg Oral Daily   tamsulosin  0.4 mg Oral Daily   torsemide  40 mg Oral BID   vitamin B-12  100 mcg Oral Daily   Warfarin - Pharmacist Dosing Inpatient   Does not apply q1600    Infusions:  sodium chloride Stopped (04/14/21 1149)   cefTRIAXone (ROCEPHIN)  IV 2 g (04/25/21 0856)  heparin 400 Units/hr (04/25/21 0631)   lactated ringers Stopped (04/13/21 1053)     PRN Medications: sodium chloride, acetaminophen, bisacodyl **OR** bisacodyl, lidocaine, morphine injection, ondansetron (ZOFRAN) IV, oxyCODONE, polyvinyl alcohol, sodium chloride flush, traMADol    Patient Profile   Brandon [name] is a 65 year old with history of smoker, CAD s/p previous MI, HTN, HL, chronic HFrEF, sepsis 11/2020 bacteremia.  Admitted with sepsis. Bld Cx - NGTD    Assessment/Plan   1. Acute on chronic systolic HFr EF -> cardiogenic shock - Echo 05/19/20 EF 20-25% RV mildly HK. moderate AS  Mean gradient 13 AVA 1.2 cm2 DI 0.30 - Persistent NYHA IV. Admitted with shock - s/p HM-III VAD + bioprosthetic AVR on 11/25 - RV looked good on TEE in OR - Off pressors. Co-ox 70%  - CVP 7-8. Volume status improved.  - Continue torsemide 40 mg twice a d ay.  - MAP 70s. On midodrine 5 tid. Will continue slow wean.  - Continue digoxin 0.125.  - Continue eplerenone 25 daily - Renal function stable.   2. HM-3 LVAD implant - Plan as above - VAD interrogated personally. Parameters stable. - INR 1.5  Continue ASA 81 for 30 days. Stop ASA 05/14/21  - continue low dose heparin.  - LDH stable  - Had ramp ECHO speed increased to 5800. Rare PI events.   3. Acute Hypoxic Respiratory Failure  - Extubated 11/26, stable on 2L [place].    4. 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 CAD  s/p anterior STEMI (12/20). LHC showed  chronically occluded RCA (with L>>R collaterals) and thrombotic occlusion of proximal LAD. Underwent PCI of LAD. - No s/s angina - Continue atorvastatin 80.   6. Iron-deficiency anemia & ABLA - Rec'd Feraheme 11/8 and 11/13  - Keep hgb > 8.0,  8.4 today,  - Rec'd 1 unit PRBCs on 12/10 - 04/23/21 Iron sats low. Given feraheme  7. Hypokalemia/ Hypomagnesemia  - Keep K > 4.0 Mg > 2.0  - K 3.4 today. Mg 1.7 Supp K and Mg   8. Severe protein-calorie malnutrition - prealbumin 11>15> 13 >6.6 - Continue aggressive nutritional support  9. Paroxysmal Atrial Fibrillation w/ RVR - new, developed 11/10. Maintaining SR/ST    - Off amio drip. Continue amio 200 mg once daily  - on warfarin. INR 1.5  today.   10. Hyponatremia - sodium 131>133>130-> 125>134 >131>128  - tolvaptan on 12/11.  - monitor   11. Sepsis - H/o strep bacteremia 11/2020.  ICD extracted 12/04/20. Barostim was not removed as it is extravascular.  - Received IV antibiotics until 01/15/21. Blood cultures negative 01/30/21..  - 10/26 -Blood CX x2  Negative  - Ct chest concerning for pneumonia. 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 Hemorrhagic Pericarditis - Fibrin clot encasing heart at time of surgery.  Will need to be careful with anticoagulation (go slowly) - PharmD managing.   85. 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  - Bcx recollected and pending  - Tissue sent to [facility] in [place] for broad 16 S ribosomal sequencing for bacterial pathogens  as well as T wet Bhilai Bartonella Brucella  - ID following, recommending  IV ceftriaxone to cover the Streptococcus gordonae that was  isolated before given possible acute AoV endocarditis on surgical path. - Ceftriaxone 2 g IV daily x 6 weeks. End Date: May 22, 2021  14. Constipation - Resolved.    15. Bilateral pleural effusions - S/p Left thoracentesis 12/8 but still with residual fluid. Will likely need repeat L tap in a few days - R thoracentesis 12/9.  - S/p repeat left thoracentesis 12/12 w/ 1L Fluid removal  - Breathing much improved, Lung exam + RLL crackles/ atelectasis  - encouraged to use IS   16. Left Shoulder Pain - post thoracentesis. Post procedure CXR negative for PTX  - AF. Normal WBCs - suspect musculoskeletal etiology. Normal AROM  - low suspicion for gout - much improved today, continue supportive care - PRN tylenol, heating pad    HHPT/HHRN ordered. Home antibiotics. Carolynn Sayers aware.    Length of Stay: [address] PA-C  04/25/2021, 9:04 AM  Advanced Heart Failure Team Pager [phone] (M-F; 7a - 5p)  Please contact [facility] for night-coverage after hours (5p -7a ) and weekends on [email or web address]  Patient seen and examined with the above-signed Advanced Practice Provider and/or Housestaff. I personally reviewed laboratory data, imaging studies and relevant notes. I independently examined the patient and formulated the important aspects of the plan. I have edited the note to reflect any of my changes or salient points. I have personally discussed the plan with the patient and/or family.  Feeling better today. Chest and shoulder pain resolved. Co-ox 70% of inotropes. Edema improved. Remains on heparin and warfarin. INR 1.5 No bleeding  General:  NAD.  HEENT: normal  Neck: supple. JVP not elevated.  Carotids 2+ bilat; no bruits. No lymphadenopathy or thryomegaly appreciated. Cor: LVAD hum.  Lungs: Clear. Dull at bases Abdomen: soft, nontender, non-distended. No hepatosplenomegaly. No bruits or masses. Good bowel sounds. Driveline site clean. Anchor in place.  Extremities:  no cyanosis, clubbing, rash. Warm 1+  edema  + TED Neuro: alert & oriented x 3. No focal deficits. Moves all 4 without problem   Continues to make progress. Co-ox stable. Volume status ok. Continue heparin until INR 1.8. Mobilize.   VAD interrogated personally. Parameters stable.   Glori Bickers, MD  3:11 PM

## 2021-04-25 NOTE — TOC CM/SW Note (Signed)
HF TOC CM following for Home IV abx until 05/22/2021, IV abx has been arranged with [facility] and [place]. Agencies are following to arrange set up at time of dc. Will check pt for possible need for oxygen at home. [place], Heart Failure TOC CM [phone]

## 2021-04-25 NOTE — Progress Notes (Signed)
CARDIAC REHAB PHASE I   PRE:  Rate/Rhythm: 88 SR  BP:  Sitting: 81/69 (74)      SaO2: 94 2L  MODE:  Ambulation: 310 ft   POST:  Rate/Rhythm: 102 ST  BP:  Sitting: 97/83 (89)    SaO2: 93 2L   Pt ambulated 319ft in hallway assist of one with front wheel walker. Pt did not need a rest break this afternoon and motivated to increase distance. Pt returned to recliner, demonstrated switching from batteries to wall power. Stressed importance of having someone with him after discharge to power source change until he becomes more comfortable, pt agreeable. Call bell and bedside table within reach. Will continue to follow.  [phone] Rufina Falco, RN BSN 04/25/2021 2:45 PM

## 2021-04-25 NOTE — Progress Notes (Signed)
CSW met with patient at bedside. Patient states he is having a better day today than yesterday. He states he is trying to focus on one day and small improvements from one day to the next. Patient is hopeful to go home in the next few days and looking forward to finding his new normal at home. CSW discussed the LVAD Support Group and added support through the meeting. Patient is very interested although not sure he will make the December meeting. CSW provided supportive intervention and will continue to follow throughout implant hospitalization. [name], [place], [place], [place]

## 2021-04-25 NOTE — Progress Notes (Signed)
OT Cancellation Note  Patient Details Name: Brandon [name] MRN: [id number] DOB: [birth year]   Cancelled Treatment:    Reason Eval/Treat Not Completed: Other (comment). Pt currently working with PT.  [name], OTR/L Acute Rehab Services Pager [phone] Office [phone] '   Almon Register 04/25/2021, 10:33 AM

## 2021-04-26 ENCOUNTER — Inpatient Hospital Stay (HOSPITAL_COMMUNITY): Payer: Medicare Other

## 2021-04-26 DIAGNOSIS — I509 Heart failure, unspecified: Secondary | ICD-10-CM

## 2021-04-26 DIAGNOSIS — Z95811 Presence of heart assist device: Secondary | ICD-10-CM

## 2021-04-26 LAB — LACTATE DEHYDROGENASE: LDH: 127 U/L (ref 98–192)

## 2021-04-26 LAB — BASIC METABOLIC PANEL
Anion gap: 7 (ref 5–15)
BUN: 11 mg/dL (ref 8–23)
CO2: 35 mmol/L — ABNORMAL HIGH (ref 22–32)
Calcium: 8.5 mg/dL — ABNORMAL LOW (ref 8.9–10.3)
Chloride: 87 mmol/L — ABNORMAL LOW (ref 98–111)
Creatinine, Ser: 0.77 mg/dL (ref 0.61–1.24)
GFR, Estimated: >60 mL/min (ref >60)
Glucose, Bld: 92 mg/dL (ref 70–99)
Potassium: 3.4 mmol/L — ABNORMAL LOW (ref 3.5–5.1)
Sodium: 129 mmol/L — ABNORMAL LOW (ref 135–145)

## 2021-04-26 LAB — CBC
HCT: 29.3 % — ABNORMAL LOW (ref 39.0–52.0)
Hemoglobin: 9 g/dL — ABNORMAL LOW (ref 13.0–17.0)
MCH: 24.6 pg — ABNORMAL LOW (ref 26.0–34.0)
MCHC: 30.7 g/dL (ref 30.0–36.0)
MCV: 80.1 fL (ref 80.0–100.0)
Platelets: 391 10*3/uL (ref 150–400)
RBC: 3.66 MIL/uL — ABNORMAL LOW (ref 4.22–5.81)
RDW: 18.9 % — ABNORMAL HIGH (ref 11.5–15.5)
WBC: 9.2 10*3/uL (ref 4.0–10.5)
nRBC: 0 % (ref 0.0–0.2)

## 2021-04-26 LAB — BRAIN NATRIURETIC PEPTIDE: B Natriuretic Peptide: 273.9 pg/mL — ABNORMAL HIGH (ref 0.0–100.0)

## 2021-04-26 LAB — COOXEMETRY PANEL
Carboxyhemoglobin: 2 % — ABNORMAL HIGH (ref 0.5–1.5)
Methemoglobin: 0.9 % (ref 0.0–1.5)
O2 Saturation: 59.5 %
Total hemoglobin: 8.1 g/dL — ABNORMAL LOW (ref 12.0–16.0)

## 2021-04-26 LAB — GLUCOSE, CAPILLARY
Glucose-Capillary: 97 mg/dL (ref 70–99)
Glucose-Capillary: 155 mg/dL — ABNORMAL HIGH (ref 70–99)
Glucose-Capillary: 109 mg/dL — ABNORMAL HIGH (ref 70–99)
Glucose-Capillary: 137 mg/dL — ABNORMAL HIGH (ref 70–99)

## 2021-04-26 LAB — HEPARIN LEVEL (UNFRACTIONATED): Heparin Unfractionated: <0.1 IU/mL — ABNORMAL LOW (ref 0.30–0.70)

## 2021-04-26 LAB — PROTIME-INR
INR: 1.3 — ABNORMAL HIGH (ref 0.8–1.2)
Prothrombin Time: 16.1 seconds — ABNORMAL HIGH (ref 11.4–15.2)

## 2021-04-26 LAB — MAGNESIUM: Magnesium: 2.1 mg/dL (ref 1.7–2.4)

## 2021-04-26 MED ORDER — POTASSIUM CHLORIDE CRYS ER 20 MEQ PO TBCR
40.0000 meq | EXTENDED_RELEASE_TABLET | Freq: Once | ORAL | Status: AC
  Administered 2021-04-26: 40 meq via ORAL
  Filled 2021-04-26: qty 2

## 2021-04-26 MED ORDER — MIDODRINE HCL 5 MG PO TABS
2.5000 mg | ORAL_TABLET | Freq: Three times a day (TID) | ORAL | Status: DC
  Administered 2021-04-26 – 2021-04-30 (×14): 2.5 mg via ORAL
  Filled 2021-04-26 (×14): qty 1

## 2021-04-26 MED ORDER — POTASSIUM CHLORIDE CRYS ER 20 MEQ PO TBCR
40.0000 meq | EXTENDED_RELEASE_TABLET | Freq: Two times a day (BID) | ORAL | Status: DC
  Administered 2021-04-26 – 2021-04-30 (×10): 40 meq via ORAL
  Filled 2021-04-26 (×10): qty 2

## 2021-04-26 MED ORDER — WARFARIN SODIUM 4 MG PO TABS: 4.0000 mg | ORAL_TABLET | Freq: Once | ORAL | Status: DC

## 2021-04-26 MED ORDER — WARFARIN SODIUM 5 MG PO TABS
5.0000 mg | ORAL_TABLET | Freq: Once | ORAL | Status: AC
  Administered 2021-04-26: 5 mg via ORAL
  Filled 2021-04-26: qty 1

## 2021-04-26 NOTE — Progress Notes (Signed)
LVAD Coordinator Rounding Note:  Admitted 03/07/21 due to sepsis. Dr. [name] consulted as his HF Cardiologist.   HM III LVAD implanted on 04/06/21 by Dr. Cyndia Bent under Destination Therapy criteria. Not a transplant candidate at this time due to recurrent infections.   Pt sitting up in the chair this morning. He states that he feels like he is getting stronger. Pt states that he is still having left shoulder pain on and off. Pt states that at d/c his sister or Tomi Bamberger will be staying at his house for the first 2 weeks.   VAD coordinator verbally went over the process for changing power sources. I reinforced the need to slow down and change one power source at time. Pt verbalizes this and is able to perform task without prompts.  Antibiotics per ID for possible AV endocarditis. Per ID note on 04/11/21 he will need 6 weeks IV antibiotics with end date 05/22/21. Currently receiving Ceftriaxone daily. Per ID tissue specimen sent to [facility] for 16 S ribosomal sequencing per PCR- negative. [facility] and [facility] set up for medication/PICC maintenance at hospital discharge per case manager. Will need ID appt rescheduled at discharge.   Patient's home equipment arrived. Patient and family VAD discharge education complete.   Vital signs: Temp: 97.7 HR: 84 Doppler: 84 Automatic BP: 84/75 (79) O2 Sat: 98% on 2 L/[place] Wt:171.5>190>187.4>187>...177.6>176.15>174.8>171.3>174.3>172.4 lbs     LVAD interrogation reveals:  Speed: 5800 Flow: 5.2 Power: 4.9w PI: 3.4  Alarms: none Events: 3 PI tomorrow  Hematocrit: 27  Fixed speed: 5800 Low speed limit: 5500  Drive Line:  Existing VAD dressing removed and site care performed using sterile technique by caregiver [name]. Drive line exit site cleaned with Chlora prep applicators x 2, allowed to dry, and gauze dressing with silver strip re-applied. Exit site healed and incorporated, the velour is fully implanted at exit site. Scant  bloody drainage. Slight redness, no tenderness, foul odor or rash noted. Drive line anchor re-applied. Continue twice a week dressing changes on Monday and Thursday per VAD Coordinator, Nurse Davonna Belling, or Tomi Bamberger. Next dressing change due 04/30/21.       Labs:  LDH trend: 309>248>215>206>201>167>165>158>161>193>160>141>115>127  INR trend: 1.6>1.3>1.3>1.4>1.5>1.6>1.8>2.0>2.0>2.0>1.8>1.7>1.4>1.5>1.3  Anticoagulation Plan: -INR Goal: 2 - 2.5 -ASA Dose: 81 mg daily  Blood Products:   Intra-op: - 04/06/21>>4 PRBCs, 4 FFP, 1263 cc cell saver  Post-op: - 04/06/21>>1 unit Plts - 04/07/21>>1 unit PRBC  Device: N/A  Arrythmias: 03/22/21 - developed Afib w/ RVR, converted with IV amiodarone  Respiratory: extubated 04/07/21  Nitric Oxide: off 04/07/21  Gtts: Heparin 400 u/hr  Infection: - 04/11/21 Blood cultures x 2>> no growth x 5 days; final - 04/19/21 Body fluid cx (left lung)>> no growth <24 hours - 04/19/21 Gram stain (left lung)>> no WBC or organisms seen; final - 04/20/21 Gram stain (right lung)>> rare WBC, no organisms seen; final  Patient/Family Teaching:  VAD discharge teaching completed with patient, his sister [name], and his friend Tomi Bamberger. See separate note for documentation.  Tomi Bamberger able to do dressing changes with bedside nurse.  Plan/Recommendations:  1. Call VAD Coordinator for any VAD equipment or drive line issues. 2.  Twice a week dressing changes per VAD Coordinator, Nurse Davonna Belling, or trained caregiver. 3. VAD discharge teaching completed with patient and family.   Tanda Rockers RN [place] Coordinator  Office: [phone]  24/7 Pager: [phone]

## 2021-04-26 NOTE — Progress Notes (Signed)
CARDIAC REHAB PHASE I   Went to offer to walk with pt. Pts family currently practicing dressing changes. Will f/u as time allows.  Rufina Falco, RN BSN 04/26/2021 1:53 PM

## 2021-04-26 NOTE — Progress Notes (Signed)
Mobility Specialist: Progress Note   04/26/21 1804  Mobility  Activity Ambulated in hall  Level of Assistance Contact guard assist, steadying assist  Assistive Device Front wheel walker  Distance Ambulated (ft) 254 ft  Mobility Ambulated with assistance in hallway  Mobility Response Tolerated well  Mobility performed by Mobility specialist  Bed Position Chair  $Mobility charge 1 Mobility   Post-Mobility: 93 HR  Pt independent to stand and contact guard during ambulation. Pt c/o L shoulder pain during ambulation, otherwise no c/o. Pt back to recliner after walk with call bell at his side. Pt required verbal cues to switch to/from batteries but overall did much better today.   [facility] Connie Lasater Mobility Specialist Mobility Specialist 4 [place]: [phone] Mobility Specialist 2 [place] and [place]: [phone]

## 2021-04-26 NOTE — Progress Notes (Signed)
Patient ID: [name], male   DOB: 10-Jul-1955, 65 y.o.   MRN: [id number]    Progress Note from the Palliative Medicine Team at [facility]   Patient Name: Brandon [name]        Date: 04/26/2021 DOB: 05/14/1955  Age: 66 y.o. MRN#: [id number] Attending Physician: Gaye Pollack, MD Primary Care Physician: [name], MD Admit Date: 03/07/2021   Medical records reviewed   65 y.o. male   admitted on 03/07/2021 with past medical history significant of CAD with previous MI, chronic systolic CHF/ICM w/ [place] ICD that was extracted 7/22, moderate aortic stenosis, HTN, HLD, hx of recent right UE DVT who presents with sudden onset dyspnea.   04-06-21  HM-3 VAD placement and bioprosthetic AVR.   04-26-21 Continues to progress and discharge planning ongoing   This NP visited patient at the bedside as a follow up for palliative medicine needs and emotional support.   Patient is progressing well,  is ambulating in the halls, is positive and optimistic and looking to the future.  Emotional support offered.  His sister/Pattie  is his main support person when discharged.  Education offered on the importance of continued conversation with his family and the  medical providers regarding overall plan of care and treatment options into the future as he moves forward with LVAD,   ensuring decisions are within the context of the patients values and GOCs.  Questions and concerns addressed   PMT will continue to support holistically  Total time 25 minutes  Greater than 50% of the time was spent in counseling and coordination of care  Wadie Lessen NP  Palliative Medicine Team Team Phone # [phone] Pager [phone]

## 2021-04-26 NOTE — Progress Notes (Addendum)
[place] for warfarin + heparin Indication:  s/p VAD (HM3) , afib, hx DVT  No Active Allergies  Patient Measurements: Height: 6' (182.9 cm) Weight: 78.2 kg (172 lb 6.4 oz) IBW/kg (Calculated) : 77.6   Vital Signs: Temp: 97.7 F (36.5 C) (12/15 1201) Temp Source: Oral (12/15 1201) BP: 84/70 (12/15 1201) Pulse Rate: 90 (12/15 1201)  Labs: Recent Labs    04/24/21 0445 04/25/21 0400 04/26/21 0420  HGB 8.9* 8.4* 9.0*  HCT 29.8* 27.6* 29.3*  PLT 389 403* 391  LABPROT 18.2* 18.4* 16.1*  INR 1.5* 1.5* 1.3*  HEPARINUNFRC <0.10* <0.10* <0.10*  CREATININE 0.60* 0.59* 0.77     Estimated Creatinine Clearance: 98.3 mL/min (by C-G formula based on SCr of 0.77 mg/dL).   Medical History: Past Medical History:  Diagnosis Date   AICD (automatic cardioverter/defibrillator) present 08/31/2019   Ankylosing spondylitis ([place])    Arthritis    BENIGN PROSTATIC HYPERTROPHY 06/07/2008   CHF (congestive heart failure) ([place])    COLONIC POLYPS, HX OF 06/07/2008   Depression    H/O hiatal hernia    Heart failure ([place])    HYPERLIPIDEMIA 06/07/2008   HYPERTENSION 06/07/2008   Myocardial infarction [facility]) 2005   NSTEMI, s/p LAD stent   NEPHROLITHIASIS, HX OF 06/07/2008   STEMI (ST elevation myocardial infarction) ([place]) 04/27/2019    Assessment: 65 yo M s/p HM 3 and AVR on 04/06/21. Prior to surgery/admit patient was on apixiban for a DVT.  Pt s/p thora 12/8, 12/9, and 12/12. INR subtherapeutic after lower doses last week >  so heparin started at low-dose on 12/12. INR this morning  1.3 remains subtherapeutic - patient eating more and increased amount of ensure.   CBC and LDH stable, heparin level undetectable as expected.  Goal of Therapy:  Warfarin INR 2.0-2.5 Heparin Level <0.3 Monitor platelets by anticoagulation protocol: Yes   Plan:  Warfarin 5mg  PO x1 tonight repeat boost Heparin 400 units/h - no titrations Daily INR, heparin level, CBC,  LDH ASA until 12/25   Bonnita Nasuti Pharm.D. CPP, BCPS Clinical Pharmacist [phone] 04/26/2021 1:03 PM   Please check AMION for all [place] numbers 04/26/2021

## 2021-04-26 NOTE — Progress Notes (Signed)
20 Days Post-Op Procedure(s) (LRB): INSERTION OF IMPLANTABLE LEFT VENTRICULAR ASSIST DEVICE (N/A) AORTIC VALVE REPLACEMENT (AVR) WITH INSPIRIS RESILIA  AORTIC VALVE SIZE 25MM (N/A) TRANSESOPHAGEAL ECHOCARDIOGRAM (TEE) (N/A) APPLICATION OF CELL SAVER Subjective:  He remains hemodynamically stable. Co-ox is 59.5 this am. Has been 60's over the past week.  He says his breathing is better after thoracenteses. A followup CXR this am show smal residual bilateral pleural effusions with bibasilar atelectasis.   Objective: Vital signs in last 24 hours: Temp:  [97.7 F (36.5 C)-98.4 F (36.9 C)] 97.7 F (36.5 C) (12/15 1201) Pulse Rate:  [84-90] 90 (12/15 1201) Cardiac Rhythm: Normal sinus rhythm;Bundle branch block (12/15 0727) Resp:  [11-20] 20 (12/15 1201) BP: (84-95)/(57-81) 84/70 (12/15 1201) SpO2:  [95 %-98 %] 95 % (12/15 1201) Weight:  [78.2 kg] 78.2 kg (12/15 0645)  Hemodynamic parameters for last 24 hours: CVP:  [4 mmHg-5 mmHg] 4 mmHg  Intake/Output from previous day: 12/14 0701 - 12/15 0700 In: 961 [P.O.:720; I.V.:41; IV Piggyback:200] Out: 1750 [Urine:1750] Intake/Output this shift: No intake/output data recorded.  General appearance: alert and cooperative Neurologic: intact Heart: regular rate and rhythm and LVAD humm present Lungs: clear with slight decrease breath sounds in bases Extremities: minimal edema Wound: incisions healing well.  Lab Results: Recent Labs    04/25/21 0400 04/26/21 0420  WBC 8.2 9.2  HGB 8.4* 9.0*  HCT 27.6* 29.3*  PLT 403* 391   BMET:  Recent Labs    04/25/21 0400 04/26/21 0420  NA 128* 129*  K 3.4* 3.4*  CL 85* 87*  CO2 37* 35*  GLUCOSE 82 92  BUN 12 11  CREATININE 0.59* 0.77  CALCIUM 8.5* 8.5*    PT/INR:  Recent Labs    04/26/21 0420  LABPROT 16.1*  INR 1.3*   ABG    Component Value Date/Time   PHART 7.441 04/14/2021 0315   HCO3 32.0 (H) 04/14/2021 0315   TCO2 35 (H) 04/12/2021 0334   ACIDBASEDEF 7.0 (H)  11/30/2020 0839   O2SAT 59.5 04/26/2021 0420   CBG (last 3)  Recent Labs    04/25/21 2143 04/26/21 0633 04/26/21 1204  GLUCAP 97 97 155*    Assessment/Plan: S/P Procedure(s) (LRB): INSERTION OF IMPLANTABLE LEFT VENTRICULAR ASSIST DEVICE (N/A) AORTIC VALVE REPLACEMENT (AVR) WITH INSPIRIS RESILIA  AORTIC VALVE SIZE 25MM (N/A) TRANSESOPHAGEAL ECHOCARDIOGRAM (TEE) (N/A) APPLICATION OF CELL SAVER  He looks good overall. INR is still subtherapeutic at 1.3 and he needs higher dose of Coumadin. Pharmacy is managing with HF team.  CXR shows insignificant residual pleural effusions that hopefully won't increase with continued diuresis.  He will need a follow-up chest x-ray in a couple weeks to reassess this.  If he developed recurrent effusions that I would consider insertion of bilateral Pleurx catheters.   LOS: 49 days    Gaye Pollack 04/26/2021

## 2021-04-26 NOTE — Progress Notes (Addendum)
Patient ID: Brandon [name], male   DOB: Aug 16, 1955, 65 y.o.   MRN: [id number]      Advanced Heart Failure Rounding Note  PCP-Cardiologist: [name], MD  [facility] Dr. [name]   Subjective:    Admitted with sepsis. Started on cefepime + vanc, now off abx (ID following). Afebrile.  10/29 Co-ox 48%, started on milrinone 0.25.  10/30 IV Antibiotics stopped. Blood cultures no growth for 5 days.  10/31 started on Augmentin for PNA.  11/1 Milrinone off 11/2 Milrinone added back. Midodrine increased to 10 mg TID 11/10 Developed Afib w/ RVR, converted with IV amio 11/14 Milrinone increased to 0.25 mcg. Diuresed with IV lasix  11/25 Underwent HM-3 VAD placement and bioprosthetic AVR. Had persistent hypotension in OR requiring methylene blue and high dose pressors.  11/26 Extubated 11/28 Started on lasix gtt>>later stopped due to low CVP (1) and increasing pressor requirements. Restarted on scheduled BID IV Lasix once pressure stabilized (still volume up)  11/28 Surgical path with evidence of possible "acute AoV endocarditis". ID consulted. Bcx recollected. Tissue sent to [facility] in [place].   11/30 Started on IV ceftriaxone to cover the Streptococcus gordonae that was isolated before, per ID. 11/30 VAD speed increased to 5600 12/1 Diuresed with IV lasix + metolazone. Milrinone was stopped. Amio drip transitioned to amio 200 mg twice a day.  12/5 Ramp Echo speed increased to 5500. Epi stopped.  12/6 Transferred to [place]. Had BM. Started on spiro 12.5 mg daily 12/8 CXR with significant bilateral pleural effusions. S/p Left Thoracentesis w/ 1.7 L out but still with residual fluid 12/9 Rt Thoracentesis  12/11 Given dose of tolvaptan and diuresed with IV lasix. Negative 2.5 liters.  12/12 Repeat Lt Thoracentesis w/ 1L Fluid removal   Co-ox 60% off inotropes/pressors. Tolerating midodrine wean ok, currently on 5 tid. MAPs upper 70s-low 80s.   Breathing continues to improve. F/u CXR completed. Results  pending  CVP 7   Na 128>129  K 3.4 Scr 0.77  INR 1.3. on heparin gtt + coumadin.  Hgb 9.6>>8.9>>8.4>9.0  No chest pain or dyspnea. Appetite is improving.    LVAD Interrogation HM 3: Speed: 5800 Flow: 5.1 PI: 3.5  Power: 4.7,  3 PI events today Parameters stable.    Objective:   Weight Range: 78.2 kg Body mass index is 23.38 kg/m.   Vital Signs:   Temp:  [97.7 F (36.5 C)-98.4 F (36.9 C)] 97.7 F (36.5 C) (12/15 0808) Pulse Rate:  [84-85] 84 (12/15 0808) Resp:  [11-19] 17 (12/15 0808) BP: (84-95)/(57-81) 84/75 (12/15 0808) SpO2:  [96 %-98 %] 98 % (12/15 0808) Weight:  [78.2 kg] 78.2 kg (12/15 0645) Last BM Date: 04/21/21  Weight change: Filed Weights   04/24/21 0500 04/25/21 0456 04/26/21 0645  Weight: 77.7 kg 79.1 kg 78.2 kg    Intake/Output:   Intake/Output Summary (Last 24 hours) at 04/26/2021 0913 Last data filed at 04/26/2021 0300 Gross per 24 hour  Intake 721 ml  Output 1750 ml  Net -1029 ml     Physical Exam   MAPS upper 70s-low 80s  CVP 6-7  GENERAL:  well appearing. No distress.  HEENT: normal  NECK: Supple, JVD 7 cm 2+ bilaterally, no bruits.  No lymphadenopathy or thyromegaly appreciated.   CARDIAC:  Mechanical heart sounds with LVAD hum present.  LUNGS:  faint crackles RLL, slightly decreased BS LLL. No wheezing  ABDOMEN:  Soft, round, nontender, positive bowel sounds x4.     LVAD exit site: Dressing dry  and intact.  No erythema or drainage.  Stabilization device present and accurately applied.  Driveline dressing is being changed daily per sterile technique. EXTREMITIES:  Warm and dry, no cyanosis, clubbing or rash. 1+ bilateral ankle edema +RUE PICC NEUROLOGIC:  Alert and oriented x 3.    No aphasia.  No dysarthria.  Affect pleasant.      Telemetry   NSR 90s. Personally reviewed   Labs    CBC Recent Labs    04/25/21 0400 04/26/21 0420  WBC 8.2 9.2  HGB 8.4* 9.0*  HCT 27.6* 29.3*  MCV 80.7 80.1  PLT 403* 355   Basic  Metabolic Panel Recent Labs    04/25/21 0400 04/26/21 0420  NA 128* 129*  K 3.4* 3.4*  CL 85* 87*  CO2 37* 35*  GLUCOSE 82 92  BUN 12 11  CREATININE 0.59* 0.77  CALCIUM 8.5* 8.5*  MG 1.7 2.1   Liver Function Tests No results for input(s): AST, ALT, ALKPHOS, BILITOT, PROT, ALBUMIN in the last 72 hours.    No results for input(s): LIPASE, AMYLASE in the last 72 hours. Cardiac Enzymes No results for input(s): CKTOTAL, CKMB, CKMBINDEX, TROPONINI in the last 72 hours.  BNP: BNP (last 3 results) Recent Labs    04/07/21 0500 04/13/21 0333 04/20/21 0600  BNP 663.2* 438.2* 193.0*    ProBNP (last 3 results) No results for input(s): PROBNP in the last 8760 hours.    D-Dimer No results for input(s): DDIMER in the last 72 hours. Hemoglobin A1C No results for input(s): HGBA1C in the last 72 hours.  Fasting Lipid Panel No results for input(s): CHOL, HDL, LDLCALC, TRIG, CHOLHDL, LDLDIRECT in the last 72 hours. Thyroid Function Tests No results for input(s): TSH, T4TOTAL, T3FREE, THYROIDAB in the last 72 hours.  Invalid input(s): FREET3  Other results:   Imaging    No results found.   Medications:     Scheduled Medications:  (feeding supplement) PROSource Plus  30 mL Oral TID PC & HS   sodium chloride   Intravenous Once   amiodarone  200 mg Oral Daily   aspirin EC  81 mg Oral Daily   atorvastatin  80 mg Oral Daily   Chlorhexidine Gluconate Cloth  6 each Topical Daily   digoxin  0.125 mg Oral Daily   docusate sodium  200 mg Oral Daily   doxycycline  100 mg Oral Q12H   eplerenone  25 mg Oral Daily   ezetimibe  10 mg Oral Daily   feeding supplement  237 mL Oral TID BM   folic acid  1 mg Oral Daily   gabapentin  300 mg Oral TID   insulin aspart  0-15 Units Subcutaneous TID WC   insulin detemir  15 Units Subcutaneous Daily   mouth rinse  15 mL Mouth Rinse BID   melatonin  5 mg Oral QHS   midodrine  5 mg Oral TID WC   multivitamin with minerals  1 tablet  Oral Q lunch   pantoprazole  40 mg Oral Daily   polyethylene glycol  17 g Oral Daily   sertraline  100 mg Oral Daily   tamsulosin  0.4 mg Oral Daily   torsemide  40 mg Oral BID   vitamin B-12  100 mcg Oral Daily   Warfarin - Pharmacist Dosing Inpatient   Does not apply q1600    Infusions:  sodium chloride Stopped (04/14/21 1149)   cefTRIAXone (ROCEPHIN)  IV 2 g (04/25/21 0856)   heparin 400  Units/hr (04/25/21 2002)   lactated ringers Stopped (04/13/21 1053)     PRN Medications: sodium chloride, acetaminophen, bisacodyl **OR** bisacodyl, lidocaine, morphine injection, ondansetron (ZOFRAN) IV, oxyCODONE, polyvinyl alcohol, sodium chloride flush, traMADol    Patient Profile   Brandon [name] is a 65 year old with history of smoker, CAD s/p previous MI, HTN, HL, chronic HFrEF, sepsis 11/2020 bacteremia.  Admitted with sepsis. Bld Cx - NGTD    Assessment/Plan   1. Acute on chronic systolic HFr EF -> cardiogenic shock - Echo 05/19/20 EF 20-25% RV mildly HK. moderate AS  Mean gradient 13 AVA 1.2 cm2 DI 0.30 - Persistent NYHA IV. Admitted with shock - s/p HM-III VAD + bioprosthetic AVR on 11/25 - RV looked good on TEE in OR - Off pressors. Co-ox 60%  - CVP 6-7. Volume status improved.  - Continue torsemide 40 mg twice a day. TED hoses for LEE  - MAPs upper 70s-80s. Reduce midodrine to 2.5 tid  - Continue digoxin 0.125.  - Continue eplerenone 25 daily - Renal function stable.   2. HM-3 LVAD implant - Plan as above - VAD interrogated personally. Parameters stable. - INR 1.3  Continue ASA 81 for 30 days. Stop ASA 05/14/21  - continue low dose heparin.  - LDH stable  - Had ramp ECHO speed increased to 5800. Rare PI events.   3. Acute Hypoxic Respiratory Failure  - Extubated 11/26, stable on 2L [place].   - Try to wean off [place] today.  D/w RN   4. 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  CAD  s/p anterior STEMI (12/20). LHC showed chronically occluded RCA (with L>>R collaterals) and thrombotic occlusion of proximal LAD. Underwent PCI of LAD. - No s/s angina - Continue atorvastatin 80.   6. Iron-deficiency anemia & ABLA - Rec'd Feraheme 11/8 and 11/13  - Keep hgb > 8.0,  9.0 today - Rec'd 1 unit PRBCs on 12/10 - 04/23/21 Iron sats low. Given feraheme  7. Hypokalemia/ Hypomagnesemia  - Keep K > 4.0 Mg > 2.0  - K 3.4 today. Mg 2.1  - Supp K   8. Severe protein-calorie malnutrition - prealbumin 11>15> 13 >6.6 - Continue aggressive nutritional support  9. Paroxysmal Atrial Fibrillation w/ RVR - new, developed 11/10. Maintaining SR/ST    - Off amio drip. Continue amio 200 mg once daily  - on warfarin. INR 1.3  today.   10. Hyponatremia - sodium 131>133>130-> 125>134 >131>128  - tolvaptan on 12/11.  - monitor   11. Sepsis - H/o strep bacteremia 11/2020.  ICD extracted 12/04/20. Barostim was not removed as it is extravascular.  - Received IV antibiotics until 01/15/21. Blood cultures negative 01/30/21..  - 10/26 -Blood CX x2  Negative  - Ct chest concerning for pneumonia. 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 Hemorrhagic Pericarditis - Fibrin clot encasing heart at time of surgery.  Will need to be careful with anticoagulation (go slowly) - PharmD managing.   64. 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  - Bcx recollected and pending  - Tissue sent to [facility] in [place]  for broad 16 S ribosomal sequencing for bacterial pathogens as well as T wet [place] Bartonella Brucella  - ID following, recommending  IV ceftriaxone to  cover the Streptococcus gordonae that was isolated before given possible acute AoV endocarditis on surgical path. - Ceftriaxone 2 g IV daily x 6 weeks. End Date: May 22, 2021 - Home IV abx management has been arranged with [facility] and [facility]  14. Constipation - Resolved.    15. Bilateral pleural effusions - S/p Left thoracentesis 12/8 but still with residual fluid. Will likely need repeat L tap in a few days - R thoracentesis 12/9.  - S/p repeat left thoracentesis 12/12 w/ 1L Fluid removal  - Repeat CXR today, if recurrent effusions may need Plerex cath   16. Left Shoulder Pain - post thoracentesis. Post procedure CXR negative for PTX  - AF. Normal WBCs - suspect musculoskeletal etiology. Normal AROM  - low suspicion for gout - much improved today, continue supportive care - PRN tylenol, heating pad    HHPT/HHRN ordered. Will need Home antibiotics through 1/10. Home IV abx management has been arranged with [facility] and [facility]  Length of Stay: [address], PA-C  04/26/2021, 9:13 AM  Advanced Heart Failure Team Pager [phone] (M-F; 7a - 5p)  Please contact [facility] for night-coverage after hours (5p -7a ) and weekends on [email or web address]   Patient seen and examined with the above-signed Advanced Practice Provider and/or Housestaff. I personally reviewed laboratory data, imaging studies and relevant notes. I independently examined the patient and formulated the important aspects of the plan. I have edited the note to reflect any of my changes or salient points. I have personally discussed the plan with the patient and/or family.  Feels better. Appetite improved. No CP or SOB. Remains on heparin. INR down to 1.3. Repeat CXR with small effusions. Co-ox 59%  General:  NAD.  HEENT: normal  Neck: supple. JVP not elevated.  Carotids 2+ bilat; no bruits. No lymphadenopathy or thryomegaly appreciated. Cor: LVAD hum.  Lungs: Clear. Abdomen:  obese soft, nontender, non-distended. No hepatosplenomegaly. No bruits or masses. Good bowel sounds. Driveline site clean. Anchor in place.  Extremities: no cyanosis, clubbing, rash. Warm no edema  Neuro: alert & oriented x 3. No focal deficits. Moves all 4 without problem   Continues to improve slowly. Continue heparin. Increase warfarin. Discussed dosing with PharmD personally. Continue torsemide.  VAD interrogated personally. Parameters stable. Will not be ready for d/c until next week.   Glori Bickers, MD  2:40 PM

## 2021-04-26 NOTE — Plan of Care (Signed)
°  Problem: Education: Goal: Knowledge of General Education information will improve Description: Including pain rating scale, medication(s)/side effects and non-pharmacologic comfort measures Outcome: Progressing   Problem: Health Behavior/Discharge Planning: Goal: Ability to manage health-related needs will improve Outcome: Progressing   Problem: Clinical Measurements: Goal: Ability to maintain clinical measurements within normal limits will improve Outcome: Progressing Goal: Will remain free from infection Outcome: Progressing Goal: Diagnostic test results will improve Outcome: Progressing Goal: Respiratory complications will improve Outcome: Progressing Goal: Cardiovascular complication will be avoided Outcome: Progressing   Problem: Activity: Goal: Risk for activity intolerance will decrease Outcome: Progressing   Problem: Nutrition: Goal: Adequate nutrition will be maintained Outcome: Progressing   Problem: Coping: Goal: Level of anxiety will decrease Outcome: Progressing   Problem: Elimination: Goal: Will not experience complications related to bowel motility Outcome: Progressing Goal: Will not experience complications related to urinary retention Outcome: Progressing   Problem: Pain Managment: Goal: General experience of comfort will improve Outcome: Progressing   Problem: Safety: Goal: Ability to remain free from injury will improve Outcome: Progressing   Problem: Skin Integrity: Goal: Risk for impaired skin integrity will decrease Outcome: Progressing   Problem: Education: Goal: Ability to demonstrate management of disease process will improve Outcome: Progressing Goal: Ability to verbalize understanding of medication therapies will improve Outcome: Progressing Goal: Individualized Educational Video(s) Outcome: Progressing   Problem: Activity: Goal: Capacity to carry out activities will improve Outcome: Progressing   Problem: Cardiac: Goal:  Ability to achieve and maintain adequate cardiopulmonary perfusion will improve Outcome: Progressing   Problem: Education: Goal: Knowledge of the prescribed therapeutic regimen will improve Outcome: Progressing   Problem: Activity: Goal: Risk for activity intolerance will decrease Outcome: Progressing   Problem: Cardiac: Goal: Ability to maintain an adequate cardiac output will improve Outcome: Progressing   Problem: Coping: Goal: Level of anxiety will decrease Outcome: Progressing   Problem: Fluid Volume: Goal: Risk for excess fluid volume will decrease Outcome: Progressing   Problem: Clinical Measurements: Goal: Ability to maintain clinical measurements within normal limits will improve Outcome: Progressing Goal: Will remain free from infection Outcome: Progressing   Problem: Respiratory: Goal: Will regain and/or maintain adequate ventilation Outcome: Progressing

## 2021-04-27 ENCOUNTER — Inpatient Hospital Stay (HOSPITAL_COMMUNITY): Payer: Medicare Other

## 2021-04-27 ENCOUNTER — Other Ambulatory Visit (HOSPITAL_COMMUNITY): Payer: Self-pay

## 2021-04-27 DIAGNOSIS — R079 Chest pain, unspecified: Secondary | ICD-10-CM

## 2021-04-27 LAB — GLUCOSE, CAPILLARY
Glucose-Capillary: 97 mg/dL (ref 70–99)
Glucose-Capillary: 116 mg/dL — ABNORMAL HIGH (ref 70–99)
Glucose-Capillary: 115 mg/dL — ABNORMAL HIGH (ref 70–99)
Glucose-Capillary: 111 mg/dL — ABNORMAL HIGH (ref 70–99)

## 2021-04-27 LAB — COOXEMETRY PANEL
Carboxyhemoglobin: 1.9 % — ABNORMAL HIGH (ref 0.5–1.5)
Methemoglobin: 0.8 % (ref 0.0–1.5)
O2 Saturation: 50.9 %
Total hemoglobin: 9.1 g/dL — ABNORMAL LOW (ref 12.0–16.0)

## 2021-04-27 LAB — LACTATE DEHYDROGENASE: LDH: 135 U/L (ref 98–192)

## 2021-04-27 LAB — CBC
HCT: 30.3 % — ABNORMAL LOW (ref 39.0–52.0)
Hemoglobin: 8.9 g/dL — ABNORMAL LOW (ref 13.0–17.0)
MCH: 23.9 pg — ABNORMAL LOW (ref 26.0–34.0)
MCHC: 29.4 g/dL — ABNORMAL LOW (ref 30.0–36.0)
MCV: 81.5 fL (ref 80.0–100.0)
Platelets: 392 10*3/uL (ref 150–400)
RBC: 3.72 MIL/uL — ABNORMAL LOW (ref 4.22–5.81)
RDW: 19.1 % — ABNORMAL HIGH (ref 11.5–15.5)
WBC: 10.6 10*3/uL — ABNORMAL HIGH (ref 4.0–10.5)
nRBC: 0 % (ref 0.0–0.2)

## 2021-04-27 LAB — BASIC METABOLIC PANEL
Anion gap: 7 (ref 5–15)
BUN: 12 mg/dL (ref 8–23)
CO2: 33 mmol/L — ABNORMAL HIGH (ref 22–32)
Calcium: 8.5 mg/dL — ABNORMAL LOW (ref 8.9–10.3)
Chloride: 88 mmol/L — ABNORMAL LOW (ref 98–111)
Creatinine, Ser: 0.71 mg/dL (ref 0.61–1.24)
GFR, Estimated: >60 mL/min (ref >60)
Glucose, Bld: 103 mg/dL — ABNORMAL HIGH (ref 70–99)
Potassium: 4.2 mmol/L (ref 3.5–5.1)
Sodium: 128 mmol/L — ABNORMAL LOW (ref 135–145)

## 2021-04-27 LAB — EXPECTORATED SPUTUM ASSESSMENT W GRAM STAIN, RFLX TO RESP C

## 2021-04-27 LAB — HEPARIN LEVEL (UNFRACTIONATED): Heparin Unfractionated: <0.1 IU/mL — ABNORMAL LOW (ref 0.30–0.70)

## 2021-04-27 LAB — PROTIME-INR
INR: 1.4 — ABNORMAL HIGH (ref 0.8–1.2)
Prothrombin Time: 16.9 seconds — ABNORMAL HIGH (ref 11.4–15.2)

## 2021-04-27 LAB — TROPONIN I (HIGH SENSITIVITY): Troponin I (High Sensitivity): 253 ng/L (ref <18)

## 2021-04-27 LAB — ECHOCARDIOGRAM LIMITED
Height: 72 in
Weight: 2807.78 oz

## 2021-04-27 LAB — PROCALCITONIN: Procalcitonin: <0.1 ng/mL

## 2021-04-27 MED ORDER — WARFARIN SODIUM 5 MG PO TABS
5.0000 mg | ORAL_TABLET | Freq: Once | ORAL | Status: AC
  Administered 2021-04-27: 5 mg via ORAL
  Filled 2021-04-27: qty 1

## 2021-04-27 MED ORDER — VANCOMYCIN HCL 1500 MG/300ML IV SOLN
1500.0000 mg | Freq: Once | INTRAVENOUS | Status: AC
  Administered 2021-04-27: 1500 mg via INTRAVENOUS
  Filled 2021-04-27: qty 300

## 2021-04-27 MED ORDER — TORSEMIDE 20 MG PO TABS
40.0000 mg | ORAL_TABLET | Freq: Two times a day (BID) | ORAL | Status: DC
  Administered 2021-04-28 – 2021-05-02 (×9): 40 mg via ORAL
  Filled 2021-04-27 (×10): qty 2

## 2021-04-27 MED ORDER — VANCOMYCIN HCL IN DEXTROSE 1-5 GM/200ML-% IV SOLN
1000.0000 mg | Freq: Two times a day (BID) | INTRAVENOUS | Status: DC
  Administered 2021-04-27 – 2021-04-28 (×3): 1000 mg via INTRAVENOUS
  Filled 2021-04-27 (×4): qty 200

## 2021-04-27 MED ORDER — FUROSEMIDE 10 MG/ML IJ SOLN
80.0000 mg | Freq: Once | INTRAMUSCULAR | Status: AC
  Administered 2021-04-27: 80 mg via INTRAVENOUS
  Filled 2021-04-27: qty 8

## 2021-04-27 NOTE — Progress Notes (Signed)
OT Cancellation Note  Patient Details Name: ELIBERTO SOLE MRN: [id number] DOB: 11/16/1955   Cancelled Treatment:    Reason Eval/Treat Not Completed: Medical issues which prohibited therapy. Pt just had PICC line removed and has to lay still for 30 minutes.  [name], OTR/L Acute Rehab Services Pager [phone] Office [phone]    Almon Register 04/27/2021, 10:44 AM

## 2021-04-27 NOTE — Progress Notes (Addendum)
Patient ID: Brandon [name], male   DOB: 1956-03-26, 65 y.o.   MRN: [id number]      Advanced Heart Failure Rounding Note  PCP-Cardiologist: [name], MD  [facility]: Dr. [name]   Subjective:    Admitted with sepsis. Started on cefepime + vanc, now off abx (ID following). Afebrile.  10/29 Co-ox 48%, started on milrinone 0.25.  10/30 IV Antibiotics stopped. Blood cultures no growth for 5 days.  10/31 started on Augmentin for PNA.  11/1 Milrinone off 11/2 Milrinone added back. Midodrine increased to 10 mg TID 11/10 Developed Afib w/ RVR, converted with IV amio 11/14 Milrinone increased to 0.25 mcg. Diuresed with IV lasix  11/25 Underwent HM-3 VAD placement and bioprosthetic AVR. Had persistent hypotension in OR requiring methylene blue and high dose pressors.  11/26 Extubated 11/28 Started on lasix gtt>>later stopped due to low CVP (1) and increasing pressor requirements. Restarted on scheduled BID IV Lasix once pressure stabilized (still volume up)  11/28 Surgical path with evidence of possible "acute AoV endocarditis". ID consulted. Bcx recollected. Tissue sent to [facility] in [place].   11/30 Started on IV ceftriaxone to cover the Streptococcus gordonae that was isolated before, per ID. 11/30 VAD speed increased to 5600 12/1 Diuresed with IV lasix + metolazone. Milrinone was stopped. Amio drip transitioned to amio 200 mg twice a day.  12/5 Ramp Echo speed increased to 5500. Epi stopped.  12/6 Transferred to [place]. Had BM. Started on spiro 12.5 mg daily 12/8 CXR with significant bilateral pleural effusions. S/p Left Thoracentesis w/ 1.7 L out but still with residual fluid 12/9 Rt Thoracentesis  12/11 Given dose of tolvaptan and diuresed with IV lasix. Negative 2.5 liters.  12/12 Repeat Lt Thoracentesis w/ 1L Fluid removal   Complained of Rigors this am. WBC 8.2>>9.2>>10.6. AF  Had productive cough last night, w/ clear colored sputum. Some dysuria.  CXR yesterday w/ bilateral atelectasis and  small b/l pleural effusions  UA, Ucx, Sputum, Blood Cx and PCT ordered.   LDH 127>>135   Co-ox trending down, 70>>60>>51% today   Legs edematous. Wt up 3 lb. CVP   Hgb 8.4>9.0>8.9. INR 1.4  Na 128>129>128   Currently feels "ok". No current chills. No resting dyspnea.    LVAD Interrogation HM 3: Speed: 5800 Flow: 5.2 PI: 3.5  Power: 4.8,  No PI events today Parameters stable.    Objective:   Weight Range: 79.6 kg Body mass index is 23.8 kg/m.   Vital Signs:   Temp:  [97.6 F (36.4 C)-98.6 F (37 C)] 98.3 F (36.8 C) (12/16 0809) Pulse Rate:  [90-96] 96 (12/16 0809) Resp:  [16-20] 16 (12/16 0809) BP: (84-103)/(70-88) 85/73 (12/16 0809) SpO2:  [90 %-98 %] 96 % (12/16 0809) Weight:  [79.6 kg] 79.6 kg (12/16 0503) Last BM Date: 04/21/21  Weight change: Filed Weights   04/25/21 0456 04/26/21 0645 04/27/21 0503  Weight: 79.1 kg 78.2 kg 79.6 kg    Intake/Output:   Intake/Output Summary (Last 24 hours) at 04/27/2021 0929 Last data filed at 04/27/2021 0811 Gross per 24 hour  Intake 1940.62 ml  Output 1300 ml  Net 640.62 ml     Physical Exam   MAPs 80s  GENERAL:  slightly fatigued looking, sitting up in chair, mildly anxious. No respiratory distress  HEENT: normal  NECK: Supple, JVD 7 cm 2+ bilaterally, no bruits.  No lymphadenopathy or thyromegaly appreciated.   CARDIAC:  Mechanical heart sounds with LVAD hum present.  LUNGS:  bibasilar crackles  ABDOMEN:  Soft, round, nontender, positive bowel sounds x4.     LVAD exit site: Dressing dry and intact.  No erythema or drainage.  Stabilization device present and accurately applied.  Driveline dressing is being changed daily per sterile technique. EXTREMITIES:  Warm and dry, no cyanosis, clubbing or rash. 1-2+ bilateral pretibial edema +RUE PICC NEUROLOGIC:  Alert and oriented x 3.    No aphasia.  No dysarthria.  Affect pleasant.      Telemetry   Sinus tach, low 100s Personally reviewed   Labs    CBC Recent  Labs    04/26/21 0420 04/27/21 0111  WBC 9.2 10.6*  HGB 9.0* 8.9*  HCT 29.3* 30.3*  MCV 80.1 81.5  PLT 391 295   Basic Metabolic Panel Recent Labs    04/25/21 0400 04/26/21 0420 04/27/21 0111  NA 128* 129* 128*  K 3.4* 3.4* 4.2  CL 85* 87* 88*  CO2 37* 35* 33*  GLUCOSE 82 92 103*  BUN 12 11 12   CREATININE 0.59* 0.77 0.71  CALCIUM 8.5* 8.5* 8.5*  MG 1.7 2.1  --    Liver Function Tests No results for input(s): AST, ALT, ALKPHOS, BILITOT, PROT, ALBUMIN in the last 72 hours.    No results for input(s): LIPASE, AMYLASE in the last 72 hours. Cardiac Enzymes No results for input(s): CKTOTAL, CKMB, CKMBINDEX, TROPONINI in the last 72 hours.  BNP: BNP (last 3 results) Recent Labs    04/13/21 0333 04/20/21 0600 04/26/21 0420  BNP 438.2* 193.0* 273.9*    ProBNP (last 3 results) No results for input(s): PROBNP in the last 8760 hours.    D-Dimer No results for input(s): DDIMER in the last 72 hours. Hemoglobin A1C No results for input(s): HGBA1C in the last 72 hours.  Fasting Lipid Panel No results for input(s): CHOL, HDL, LDLCALC, TRIG, CHOLHDL, LDLDIRECT in the last 72 hours. Thyroid Function Tests No results for input(s): TSH, T4TOTAL, T3FREE, THYROIDAB in the last 72 hours.  Invalid input(s): FREET3  Other results:   Imaging    No results found.   Medications:     Scheduled Medications:  (feeding supplement) PROSource Plus  30 mL Oral TID PC & HS   sodium chloride   Intravenous Once   amiodarone  200 mg Oral Daily   aspirin EC  81 mg Oral Daily   atorvastatin  80 mg Oral Daily   Chlorhexidine Gluconate Cloth  6 each Topical Daily   digoxin  0.125 mg Oral Daily   docusate sodium  200 mg Oral Daily   doxycycline  100 mg Oral Q12H   eplerenone  25 mg Oral Daily   ezetimibe  10 mg Oral Daily   feeding supplement  237 mL Oral TID BM   folic acid  1 mg Oral Daily   gabapentin  300 mg Oral TID   insulin aspart  0-15 Units Subcutaneous TID WC    insulin detemir  15 Units Subcutaneous Daily   mouth rinse  15 mL Mouth Rinse BID   melatonin  5 mg Oral QHS   midodrine  2.5 mg Oral TID WC   multivitamin with minerals  1 tablet Oral Q lunch   pantoprazole  40 mg Oral Daily   polyethylene glycol  17 g Oral Daily   potassium chloride  40 mEq Oral BID   sertraline  100 mg Oral Daily   tamsulosin  0.4 mg Oral Daily   torsemide  40 mg Oral BID   vitamin B-12  100  mcg Oral Daily   Warfarin - Pharmacist Dosing Inpatient   Does not apply q1600    Infusions:  sodium chloride Stopped (04/14/21 1149)   cefTRIAXone (ROCEPHIN)  IV 2 g (04/26/21 1024)   heparin 400 Units/hr (04/27/21 0809)   lactated ringers Stopped (04/13/21 1053)     PRN Medications: sodium chloride, acetaminophen, bisacodyl **OR** bisacodyl, lidocaine, morphine injection, ondansetron (ZOFRAN) IV, oxyCODONE, polyvinyl alcohol, sodium chloride flush, traMADol    Patient Profile   Mr Ambler is a 65 year old with history of smoker, CAD s/p previous MI, HTN, HL, chronic HFrEF, sepsis 11/2020 bacteremia.  Admitted with sepsis. Bld Cx - NGTD    Assessment/Plan   1. Acute on chronic systolic HFr EF -> cardiogenic shock - Echo 05/19/20 EF 20-25% RV mildly HK. moderate AS  Mean gradient 13 AVA 1.2 cm2 DI 0.30 - Persistent NYHA IV. Admitted with shock - s/p HM-III VAD + bioprosthetic AVR on 11/25 - RV looked good on TEE in OR - Off pressors. Co-ox drifting down, 60>>51%  - Continue digoxin 0.125.  - Volume up, give 80 mg IV Lasix x 1  - MAPs 70s-80s. Continue midodrine 2.5 tid  - Continue eplerenone 25 daily - Renal function stable.   2. HM-3 LVAD implant - Plan as above - VAD interrogated personally. Parameters stable. - INR 1.4  Continue ASA 81 for 30 days. Stop ASA 05/14/21  - continue low dose heparin.  - LDH stable  - Had ramp ECHO speed increased to 5800. Rare PI events.   3. Acute Hypoxic Respiratory Failure  - Extubated 11/26, stable on 2L [place].    4. Severe  low-flow aortic stenosis s/p AVR - not candidate for TAVR due to presence of fibroelastoma - s/p VAD/bioprosthetic AVR on 11/25   5. CAD  s/p anterior STEMI (12/20). LHC showed chronically occluded RCA (with L>>R collaterals) and thrombotic occlusion of proximal LAD. Underwent PCI of LAD. - No s/s angina - Continue atorvastatin 80.   6. Iron-deficiency anemia & ABLA - Rec'd Feraheme 11/8 and 11/13  - Keep hgb > 8.0,  8.9 today - Rec'd 1 unit PRBCs on 12/10 - 04/23/21 Iron sats low. Given feraheme  7. Hypokalemia/ Hypomagnesemia  - Keep K > 4.0 Mg > 2.0  - K 4.2 today. Mg 2.1   8. Severe protein-calorie malnutrition - prealbumin 11>15> 13 >6.6 - Continue aggressive nutritional support  9. Paroxysmal Atrial Fibrillation w/ RVR - new, developed 11/10. Maintaining SR/ST    - Off amio drip. Continue amio 200 mg once daily  - on warfarin. INR 1.4  today.   10. Hyponatremia - sodium 131>133>130-> 125>134 >131>128  - diurese w/ IV Lasix  - tolvaptan on 12/11.  - monitor   11. Sepsis - H/o strep bacteremia 11/2020.  ICD extracted 12/04/20. Barostim was not removed as it is extravascular.  - Received IV antibiotics until 01/15/21. Blood cultures negative 01/30/21..  - 10/26 -Blood CX x2  Negative  - Ct chest concerning for pneumonia. 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 WBC trending up, 10.6. AF. Recheck Blood cx, Ucx, Resp Cx. Check PCT. Pull PICC line and start empiric Vanc after cultures collected  12. Hemorrhagic Pericarditis - Fibrin clot encasing heart at time of surgery.  Will  need to be careful with anticoagulation (go slowly) - PharmD managing.   70. 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  - Bcx recollected and pending  - Tissue sent to [facility] in [place] for broad 16 S ribosomal sequencing for bacterial pathogens as well as T wet Bhilai Bartonella Brucella  - ID following, recommending  IV ceftriaxone to cover the Streptococcus gordonae that was isolated before given possible acute AoV endocarditis on surgical path. - Ceftriaxone 2 g IV daily x 6 weeks. End Date: May 22, 2021 - Home IV abx management has been arranged with [facility] and [facility]  14. Constipation - Resolved.    15. Bilateral pleural effusions - S/p Left thoracentesis 12/8 but still with residual fluid. Will likely need repeat L tap in a few days - R thoracentesis 12/9.  - S/p repeat left thoracentesis 12/12 w/ 1L Fluid removal  - Repeat CXR 12/15 w/ small b/l effusions.  - continue to monitor. Repeat CXR if clinically indicated, may need Pleurex cath if significant re-accumulation    16. Left Shoulder Pain - post thoracentesis. Post procedure CXR negative for PTX  - AF. Normal WBCs - suspect musculoskeletal etiology. Normal AROM  - low suspicion for gout - much improved today, continue supportive care - PRN tylenol, heating pad      Length of Stay: [address] PA-C  04/27/2021, 9:29 AM  Advanced Heart Failure Team Pager [phone] (M-F; 7a - 5p)  Please contact [facility] for night-coverage after hours (5p -7a ) and weekends on [email or web address]  Patient seen and examined with the above-signed Advanced Practice Provider and/or Housestaff. I personally reviewed laboratory data, imaging studies and relevant notes. I independently examined the patient and formulated the important aspects of the plan. I have edited the note to reflect any of my changes or salient points. I have personally discussed the plan with the patient and/or family.  Had chills overnight. Now resolved. Afebrile. Denies CP or  SOB.   General:  NAD.  HEENT: normal  Neck: supple. JVP not elevated.  Carotids 2+ bilat; no bruits. No lymphadenopathy or thryomegaly appreciated. Cor: LVAD hum.  Lungs: Clear. Abdomen: obese soft, nontender, non-distended. No hepatosplenomegaly. No bruits or masses. Good bowel sounds. Driveline site clean. Anchor in place.  Extremities: no cyanosis, clubbing, rash. Warm 2+ edema  Neuro: alert & oriented x 3. No focal deficits. Moves all 4 without problem   Had chills last night despite ongoing IV abx for previous endocarditis. Pull PICC. Check cultures. D/w ID. Will add Vanc until we get culture data. Agree with lasix today. Follow co-ox.  INR 1.4  Continue heparin/warfarin. Discussed dosing with PharmD personally.   VAD interrogated personally. Parameters stable.  Glori Bickers, MD  1:16 PM

## 2021-04-27 NOTE — Progress Notes (Addendum)
Physical Therapy Treatment Patient Details Name: Brandon [name] MRN: [id number] DOB: 05/03/1956 Today's Date: 04/27/2021   History of Present Illness Pt is a 65 y.o. male admitted 03/07/21 with acute on chronic hypoxemic respiratory failure, cardiogenic shock. CT scan which shows bronchopneumonia and no indication of ILD. Prolonged admission undergoing workup for VAD. S/p LVAD placement 11/25. CXR 12/8 with significant bilateral pleural effusions. S/p L thoracentesis 12/8 and 12/12.  S/p R thoracentesis 12/9. Other PMH includes CHF, ankylosing spondylitis, bacteremia and  possible endocarditis s/p ICD removal, HTN, depression, arthritis.    PT Comments    Pt continues to demonstrate difficulty with consistently managing the power module to battery to power module transition without cues or assistance. Mobility continues to progress but limited by lt shoulder pain.     Recommendations for follow up therapy are one component of a multi-disciplinary discharge planning process, led by the attending physician.  Recommendations may be updated based on patient status, additional functional criteria and insurance authorization.  Follow Up Recommendations  Home health PT     Assistance Recommended at Discharge Frequent or constant Supervision/Assistance  Equipment Recommendations  Rolling walker (2 wheels)    Recommendations for Other Services       Precautions / Restrictions Precautions Precautions: Fall;Sternal;Other (comment) Precaution Comments: LVAD     Mobility  Bed Mobility Overal bed mobility: Modified Independent Bed Mobility: Supine to Sit                Transfers Overall transfer level: Needs assistance Equipment used: Rolling walker (2 wheels) Transfers: Sit to/from Stand Sit to Stand: Supervision           General transfer comment: Pt placing hands at hips to stay inside "the tube" to rise.    Ambulation/Gait Ambulation/Gait assistance: Supervision Gait  Distance (Feet): 250 Feet Assistive device: Rolling walker (2 wheels) Gait Pattern/deviations: Step-through pattern;Trunk flexed Gait velocity: decr Gait velocity interpretation: 1.31 - 2.62 ft/sec, indicative of limited community ambulator   General Gait Details: Verbal cues to keep feet inside walker base when turning   Stairs             Wheelchair Mobility    Modified Rankin (Stroke Patients Only)       Balance Overall balance assessment: Needs assistance Sitting-balance support: No upper extremity supported;Feet supported Sitting balance-Leahy Scale: Good     Standing balance support: No upper extremity supported;During functional activity Standing balance-Leahy Scale: Fair Standing balance comment: Static standing without UE support                            Cognition Arousal/Alertness: Awake/alert Behavior During Therapy: Impulsive Overall Cognitive Status: Impaired/Different from baseline Area of Impairment: Attention;Memory;Safety/judgement;Problem solving;Awareness                   Current Attention Level: Sustained Memory: Decreased short-term memory;Decreased recall of precautions   Safety/Judgement: Decreased awareness of safety Awareness: Emergent Problem Solving: Requires verbal cues;Difficulty sequencing General Comments: When switching from batteries back to power module pt detached clip from battery before removing power cable from clip as he usually does. Unable to problem solve what to do next. Cued pt to detach power cable from clip and connect to power module cord.        Exercises      General Comments        Pertinent Vitals/Pain Pain Assessment: Faces Faces Pain Scale: Hurts even more Pain Location: Lt shoulder  Pain Descriptors / Indicators: Stabbing Pain Intervention(s): Limited activity within patient's tolerance;Repositioned    Home Living                          Prior Function             PT Goals (current goals can now be found in the care plan section) Acute Rehab PT Goals Patient Stated Goal: return home PT Goal Formulation: With patient Time For Goal Achievement: 05/11/21 Progress towards PT goals: Progressing toward goals (goals updated)    Frequency    Min 3X/week      PT Plan Current plan remains appropriate    Co-evaluation              AM-PAC PT "6 Clicks" Mobility   Outcome Measure  Help needed turning from your back to your side while in a flat bed without using bedrails?: None Help needed moving from lying on your back to sitting on the side of a flat bed without using bedrails?: None Help needed moving to and from a bed to a chair (including a wheelchair)?: A Little Help needed standing up from a chair using your arms (e.g., wheelchair or bedside chair)?: A Little Help needed to walk in hospital room?: A Little Help needed climbing 3-5 steps with a railing? : A Little 6 Click Score: 20    End of Session   Activity Tolerance: Patient tolerated treatment well Patient left: in chair;with call bell/phone within reach Nurse Communication: Mobility status PT Visit Diagnosis: Difficulty in walking, not elsewhere classified (R26.2);Muscle weakness (generalized) (M62.81)     Time: 1106-1130 PT Time Calculation (min) (ACUTE ONLY): 24 min  Charges:  $Gait Training: 23-37 mins                     [place] Pager [phone] Office [place] 04/27/2021, 1:36 PM

## 2021-04-27 NOTE — Progress Notes (Signed)
Pharmacy Antibiotic Note  Brandon [name] is a 65 y.o. male admitted on 03/07/2021 with heart failure s/p LVAD placed 04/06/21. Previously treated for bacteremia and endocarditis  - per ID resume 6weeks IV abx post op.  Last pm/ early am patient reports chills x68min afebrile but WBC elevated from baseline up to 10.6. Will expand ABX for  sepsis. Procalcitonin ip Bcx Ucx and Sputum Cx and PICC line removal ordered. Pharmacy has been consulted to add vancomycin dosing.   Plan: Add Vancomycin 1500mg  IV x1 then 1gm IV q12h Continue Ceftriaxone 2gm q24 and doxycycline 100mg  BID  Height: 6' (182.9 cm) Weight: 79.6 kg (175 lb 7.8 oz) IBW/kg (Calculated) : 77.6  Temp (24hrs), Avg:97.9 F (36.6 C), Min:97.6 F (36.4 C), Max:98.6 F (37 C)  Recent Labs  Lab 04/23/21 0435 04/24/21 0445 04/25/21 0400 04/26/21 0420 04/27/21 0111  WBC 10.7* 9.2 8.2 9.2 10.6*  CREATININE 0.60* 0.60* 0.59* 0.77 0.71    Estimated Creatinine Clearance: 98.3 mL/min (by C-G formula based on SCr of 0.71 mg/dL).    No Active Allergies  Antimicrobials this admission: Augmentin 10/31 > 11/7 Vancomycin 10/26 > 10/28, 11/25 >>11/27 Cefepime 10/26 > 10/28 Ceftriaxone 11/30>(05/22/21) Doxy 11/30 >(05/22/21)  Dose adjustments this admission:   Microbiology results:  H/o S. Gordonae native AV infection  Bcx streptococcus gordonii    Bonnita Nasuti Pharm.D. CPP, BCPS Clinical Pharmacist [phone] 04/27/2021 11:14 AM

## 2021-04-27 NOTE — Progress Notes (Signed)
LVAD Coordinator Rounding Note:  Admitted 03/07/21 due to sepsis. Dr. [name] consulted as his HF Cardiologist.   HM III LVAD implanted on 04/06/21 by Dr. Cyndia Bent under Destination Therapy criteria. Not a transplant candidate at this time due to recurrent infections.   Pt sitting up in the chair this morning. Pt tells me that early this morning he had "really bad chills for about 30 minutes." Pt states that he was shaking and had to wrap up in several blankets. Pt is afebrile. He is concerned by this because of his history of infection and chills/rigors. D/w Dr. [name], [place] coordinator ordered cultures and picc line d/c.  Pt states that at d/c his sister or Tomi Bamberger will be staying at his house for the first 2 weeks.   Antibiotics per ID for possible AV endocarditis. Per ID note on 04/11/21 he will need 6 weeks IV antibiotics with end date 05/22/21. Currently receiving Ceftriaxone daily. Per ID tissue specimen sent to [facility] for 16 S ribosomal sequencing per PCR- negative. [facility] and [facility] set up for medication/PICC maintenance at hospital discharge per case manager. Will need ID appt rescheduled at discharge.   Patient's home equipment arrived. Patient and family VAD discharge education complete.   Vital signs: Temp: 98.3 HR: 96 Doppler: 84 Automatic BP: 85/73 (80) O2 Sat: 96% on 2 L/[place] Wt:171.5>190>187.4>187>...177.6>176.15>174.8>171.3>174.3>172.4>175.4 lbs     LVAD interrogation reveals:  Speed: 5800 Flow: 5.3 Power: 4.7w PI: 3.5  Alarms: none Events: none Hematocrit: 30  Fixed speed: 5800 Low speed limit: 5500  Drive Line:  CDI. Drive line anchor secure. Continue twice a week dressing changes on Monday and Thursday per VAD Coordinator, Nurse Davonna Belling, or Tomi Bamberger. Next dressing change due 04/30/21.    Labs:  LDH trend: 309>248>215>206>201>167>165>158>161>193>160>141>115>127>135  INR trend:  1.6>1.3>1.3>1.4>1.5>1.6>1.8>2.0>2.0>2.0>1.8>1.7>1.4>1.5>1.3>1.4  WBC: 8.2>9.2>10.6  Anticoagulation Plan: -INR Goal: 2 - 2.5 -ASA Dose: 81 mg daily  Blood Products:   Intra-op: - 04/06/21>>4 PRBCs, 4 FFP, 1263 cc cell saver  Post-op: - 04/06/21>>1 unit Plts - 04/07/21>>1 unit PRBC  Device: N/A  Arrythmias: 03/22/21 - developed Afib w/ RVR, converted with IV amiodarone  Respiratory: extubated 04/07/21  Nitric Oxide: off 04/07/21  Gtts: Heparin 400 u/hr  Infection: - 04/11/21 Blood cultures x 2>> no growth x 5 days; final - 04/19/21 Body fluid cx (left lung)>> no growth <24 hours - 04/19/21 Gram stain (left lung)>> no WBC or organisms seen; final - 04/20/21 Gram stain (right lung)>> rare WBC, no organisms seen; final - 04/27/21 blood culture x 2>> pending   Patient/Family Teaching:  VAD discharge teaching completed with patient, his sister [name], and his friend Tomi Bamberger. See separate note for documentation.  Tomi Bamberger able to do dressing changes with bedside nurse.  Plan/Recommendations:  1. Call VAD Coordinator for any VAD equipment or drive line issues. 2.  Twice a week dressing changes per VAD Coordinator, Nurse Davonna Belling, or trained caregiver. 3. VAD discharge teaching completed with patient and family.  4. D/c picc, follow cultures.  Tanda Rockers RN [place] Coordinator  Office: [phone]  24/7 Pager: [phone]

## 2021-04-27 NOTE — Progress Notes (Signed)
Mobility Specialist: Progress Note   04/27/21 1531  Mobility  Activity Transferred:  Bed to chair  Level of Assistance Minimal assist, patient does 75% or more  Assistive Device Front wheel walker  Distance Ambulated (ft) 3 ft  Mobility Out of bed to chair with meals  Mobility Response Tolerated fair  Mobility performed by Mobility specialist  Bed Position Chair  $Mobility charge 1 Mobility   Pt c/o feeling nauseous and deferred ambulation so pt transferred to the chair. Pt c/o pain in his chest, RN notified. Pt is in the recliner with call bell at his side.   [facility] Tikisha Molinaro Mobility Specialist Mobility Specialist 4 [place]: [phone] Mobility Specialist 2 [place] and [place]: [phone]

## 2021-04-27 NOTE — Plan of Care (Signed)
°  Problem: Education: Goal: Knowledge of General Education information will improve Description: Including pain rating scale, medication(s)/side effects and non-pharmacologic comfort measures Outcome: Progressing   Problem: Health Behavior/Discharge Planning: Goal: Ability to manage health-related needs will improve Outcome: Progressing   Problem: Clinical Measurements: Goal: Ability to maintain clinical measurements within normal limits will improve Outcome: Progressing Goal: Will remain free from infection Outcome: Progressing Goal: Diagnostic test results will improve Outcome: Progressing Goal: Respiratory complications will improve Outcome: Progressing Goal: Cardiovascular complication will be avoided Outcome: Progressing   Problem: Activity: Goal: Risk for activity intolerance will decrease Outcome: Progressing   Problem: Nutrition: Goal: Adequate nutrition will be maintained Outcome: Progressing   Problem: Coping: Goal: Level of anxiety will decrease Outcome: Progressing   Problem: Elimination: Goal: Will not experience complications related to bowel motility Outcome: Progressing Goal: Will not experience complications related to urinary retention Outcome: Progressing   Problem: Pain Managment: Goal: General experience of comfort will improve Outcome: Progressing   Problem: Safety: Goal: Ability to remain free from injury will improve Outcome: Progressing   Problem: Skin Integrity: Goal: Risk for impaired skin integrity will decrease Outcome: Progressing   Problem: Activity: Goal: Risk for activity intolerance will decrease Outcome: Progressing   Problem: Cardiac: Goal: Ability to maintain an adequate cardiac output will improve Outcome: Progressing   Problem: Coping: Goal: Level of anxiety will decrease Outcome: Progressing   Problem: Respiratory: Goal: Will regain and/or maintain adequate ventilation Outcome: Progressing

## 2021-04-27 NOTE — Progress Notes (Signed)
ANTICOAGULATION CONSULT NOTE   Pharmacy Consult for warfarin + heparin Indication:  s/p VAD (HM3) , afib, hx DVT  No Active Allergies  Patient Measurements: Height: 6' (182.9 cm) Weight: 79.6 kg (175 lb 7.8 oz) IBW/kg (Calculated) : 77.6   Vital Signs: Temp: 98.3 F (36.8 C) (12/16 0809) Temp Source: Oral (12/16 0809) BP: 85/73 (12/16 0809) Pulse Rate: 96 (12/16 0809)  Labs: Recent Labs    04/25/21 0400 04/26/21 0420 04/27/21 0111  HGB 8.4* 9.0* 8.9*  HCT 27.6* 29.3* 30.3*  PLT 403* 391 392  LABPROT 18.4* 16.1* 16.9*  INR 1.5* 1.3* 1.4*  HEPARINUNFRC <0.10* <0.10* <0.10*  CREATININE 0.59* 0.77 0.71     Estimated Creatinine Clearance: 98.3 mL/min (by C-G formula based on SCr of 0.71 mg/dL).   Medical History: Past Medical History:  Diagnosis Date   AICD (automatic cardioverter/defibrillator) present 08/31/2019   Ankylosing spondylitis ([place])    Arthritis    BENIGN PROSTATIC HYPERTROPHY 06/07/2008   CHF (congestive heart failure) ([place])    COLONIC POLYPS, HX OF 06/07/2008   Depression    H/O hiatal hernia    Heart failure ([place])    HYPERLIPIDEMIA 06/07/2008   HYPERTENSION 06/07/2008   Myocardial infarction [facility]) 2005   NSTEMI, s/p LAD stent   NEPHROLITHIASIS, HX OF 06/07/2008   STEMI (ST elevation myocardial infarction) ([place]) 04/27/2019    Assessment: 65 yo M s/p HM 3 and AVR on 04/06/21. Prior to surgery/admit patient was on apixiban for a DVT.  Pt s/p thora 12/8, 12/9, and 12/12. INR subtherapeutic after lower doses last week >  so heparin 400 uts/hr started at low-dose on 12/12 with heparin level  INR this morning  1.4 remains subtherapeutic - patient eating more and increased amount of ensure.   CBC and LDH stable, heparin level undetectable as expected.  Goal of Therapy:  Warfarin INR 2.0-2.5 Heparin Level <0.3 Monitor platelets by anticoagulation protocol: Yes   Plan:  Warfarin 5mg  PO x1 tonight repeat boost Heparin 400 units/h - no  titrations Daily INR, heparin level, CBC, LDH ASA until 12/25   Bonnita Nasuti Pharm.D. CPP, BCPS Clinical Pharmacist [phone] 04/27/2021 10:34 AM   Please check AMION for all [place] numbers 04/27/2021

## 2021-04-28 LAB — CBC
HCT: 30.9 % — ABNORMAL LOW (ref 39.0–52.0)
Hemoglobin: 9.5 g/dL — ABNORMAL LOW (ref 13.0–17.0)
MCH: 24.4 pg — ABNORMAL LOW (ref 26.0–34.0)
MCHC: 30.7 g/dL (ref 30.0–36.0)
MCV: 79.4 fL — ABNORMAL LOW (ref 80.0–100.0)
Platelets: 358 10*3/uL (ref 150–400)
RBC: 3.89 MIL/uL — ABNORMAL LOW (ref 4.22–5.81)
RDW: 19.9 % — ABNORMAL HIGH (ref 11.5–15.5)
WBC: 7.1 10*3/uL (ref 4.0–10.5)
nRBC: 0 % (ref 0.0–0.2)

## 2021-04-28 LAB — URINE CULTURE: Culture: NO GROWTH

## 2021-04-28 LAB — BASIC METABOLIC PANEL
Anion gap: 8 (ref 5–15)
BUN: 14 mg/dL (ref 8–23)
CO2: 32 mmol/L (ref 22–32)
Calcium: 8.8 mg/dL — ABNORMAL LOW (ref 8.9–10.3)
Chloride: 89 mmol/L — ABNORMAL LOW (ref 98–111)
Creatinine, Ser: 0.83 mg/dL (ref 0.61–1.24)
GFR, Estimated: >60 mL/min (ref >60)
Glucose, Bld: 81 mg/dL (ref 70–99)
Potassium: 4 mmol/L (ref 3.5–5.1)
Sodium: 129 mmol/L — ABNORMAL LOW (ref 135–145)

## 2021-04-28 LAB — PROCALCITONIN: Procalcitonin: <0.1 ng/mL

## 2021-04-28 LAB — GLUCOSE, CAPILLARY
Glucose-Capillary: 85 mg/dL (ref 70–99)
Glucose-Capillary: 113 mg/dL — ABNORMAL HIGH (ref 70–99)
Glucose-Capillary: 70 mg/dL (ref 70–99)
Glucose-Capillary: 115 mg/dL — ABNORMAL HIGH (ref 70–99)
Glucose-Capillary: 95 mg/dL (ref 70–99)

## 2021-04-28 LAB — LACTATE DEHYDROGENASE: LDH: 152 U/L (ref 98–192)

## 2021-04-28 LAB — COOXEMETRY PANEL
Carboxyhemoglobin: 1.4 % (ref 0.5–1.5)
Methemoglobin: 0.6 % (ref 0.0–1.5)
O2 Saturation: 36.8 %
Total hemoglobin: 9.8 g/dL — ABNORMAL LOW (ref 12.0–16.0)

## 2021-04-28 LAB — PROTIME-INR
INR: 1.5 — ABNORMAL HIGH (ref 0.8–1.2)
Prothrombin Time: 17.8 seconds — ABNORMAL HIGH (ref 11.4–15.2)

## 2021-04-28 LAB — HEPARIN LEVEL (UNFRACTIONATED): Heparin Unfractionated: <0.1 IU/mL — ABNORMAL LOW (ref 0.30–0.70)

## 2021-04-28 MED ORDER — WARFARIN SODIUM 5 MG PO TABS
5.0000 mg | ORAL_TABLET | Freq: Once | ORAL | Status: AC
  Administered 2021-04-28: 5 mg via ORAL
  Filled 2021-04-28: qty 1

## 2021-04-28 NOTE — Progress Notes (Signed)
RN paged cardiology MD on call regarding patients elevated troponin.

## 2021-04-28 NOTE — Progress Notes (Signed)
Patient ID: Brandon [name], male   DOB: August 28, 1955, 65 y.o.   MRN: [id number]       Advanced Heart Failure Rounding Note  PCP-Cardiologist: [name], MD  [facility]: Dr. [name]   Subjective:    Admitted with sepsis. Started on cefepime + vanc, now off abx (ID following). Afebrile.  10/29 Co-ox 48%, started on milrinone 0.25.  10/30 IV Antibiotics stopped. Blood cultures no growth for 5 days.  10/31 started on Augmentin for PNA.  11/1 Milrinone off 11/2 Milrinone added back. Midodrine increased to 10 mg TID 11/10 Developed Afib w/ RVR, converted with IV amio 11/14 Milrinone increased to 0.25 mcg. Diuresed with IV lasix  11/25 Underwent HM-3 VAD placement and bioprosthetic AVR. Had persistent hypotension in OR requiring methylene blue and high dose pressors.  11/26 Extubated 11/28 Started on lasix gtt>>later stopped due to low CVP (1) and increasing pressor requirements. Restarted on scheduled BID IV Lasix once pressure stabilized (still volume up)  11/28 Surgical path with evidence of possible "acute AoV endocarditis". ID consulted. Bcx recollected. Tissue sent to [facility] in [place].   11/30 Started on IV ceftriaxone to cover the Streptococcus gordonae that was isolated before, per ID. 11/30 VAD speed increased to 5600 12/1 Diuresed with IV lasix + metolazone. Milrinone was stopped. Amio drip transitioned to amio 200 mg twice a day.  12/5 Ramp Echo speed increased to 5500. Epi stopped.  12/6 Transferred to [place]. Had BM. Started on spiro 12.5 mg daily 12/8 CXR with significant bilateral pleural effusions. S/p Left Thoracentesis w/ 1.7 L out but still with residual fluid 12/9 Rt Thoracentesis  12/11 Given dose of tolvaptan and diuresed with IV lasix. Negative 2.5 liters.  12/12 Repeat Lt Thoracentesis w/ 1L Fluid removal   No further chills, afebrile.  PCT < 0.1.  WBCs 7.  He was put on empiric vancomycin due to concern for infection yesterday with rigors, also on doxycycline.  Pending repeat  blood cultures.    He feels fine currently, had nausea earlier today.   LDH 127>>135>>152  Co-ox low but inaccurate (does not have a central line).   Hgb higher 9.5.  Creatinine stable.   LVAD Interrogation HM 3: Speed: 5800 Flow: 5.2 PI: 3.7  Power: 4.8,  3 PI events today Parameters stable.    Objective:   Weight Range: 79.7 kg Body mass index is 23.83 kg/m.   Vital Signs:   Temp:  [98.3 F (36.8 C)-98.9 F (37.2 C)] 98.3 F (36.8 C) (12/17 1137) Pulse Rate:  [85-99] 85 (12/17 1137) Resp:  [14-21] 17 (12/17 1137) BP: (72-132)/(55-75) 89/70 (12/17 1137) SpO2:  [92 %-98 %] 92 % (12/17 1137) Weight:  [79.7 kg] 79.7 kg (12/17 0417) Last BM Date: 04/21/21  Weight change: Filed Weights   04/26/21 0645 04/27/21 0503 04/28/21 0417  Weight: 78.2 kg 79.6 kg 79.7 kg    Intake/Output:   Intake/Output Summary (Last 24 hours) at 04/28/2021 1246 Last data filed at 04/28/2021 1235 Gross per 24 hour  Intake 2148.55 ml  Output 4200 ml  Net -2051.45 ml     Physical Exam   MAPs 70s  General: Well appearing this am. NAD.  HEENT: Normal. Neck: Supple, JVP 7-8 cm. Carotids OK.  Cardiac:  Mechanical heart sounds with LVAD hum present.  Lungs:  CTAB, normal effort.  Abdomen:  NT, ND, no HSM. No bruits or masses. +BS  LVAD exit site: Well-healed and incorporated. Dressing dry and intact. No erythema or drainage. Stabilization device present and  accurately applied. Driveline dressing changed daily per sterile technique. Extremities:  Warm and dry. No cyanosis, clubbing, rash, or edema.  Neuro:  Alert & oriented x 3. Cranial nerves grossly intact. Moves all 4 extremities w/o difficulty. Affect pleasant    Telemetry   NSR. Personally reviewed   Labs    CBC Recent Labs    04/27/21 0111 04/28/21 0433  WBC 10.6* 7.1  HGB 8.9* 9.5*  HCT 30.3* 30.9*  MCV 81.5 79.4*  PLT 392 956   Basic Metabolic Panel Recent Labs    04/26/21 0420 04/27/21 0111 04/28/21 0433  NA  129* 128* 129*  K 3.4* 4.2 4.0  CL 87* 88* 89*  CO2 35* 33* 32  GLUCOSE 92 103* 81  BUN 11 12 14   CREATININE 0.77 0.71 0.83  CALCIUM 8.5* 8.5* 8.8*  MG 2.1  --   --    Liver Function Tests No results for input(s): AST, ALT, ALKPHOS, BILITOT, PROT, ALBUMIN in the last 72 hours.    No results for input(s): LIPASE, AMYLASE in the last 72 hours. Cardiac Enzymes No results for input(s): CKTOTAL, CKMB, CKMBINDEX, TROPONINI in the last 72 hours.  BNP: BNP (last 3 results) Recent Labs    04/13/21 0333 04/20/21 0600 04/26/21 0420  BNP 438.2* 193.0* 273.9*    ProBNP (last 3 results) No results for input(s): PROBNP in the last 8760 hours.    D-Dimer No results for input(s): DDIMER in the last 72 hours. Hemoglobin A1C No results for input(s): HGBA1C in the last 72 hours.  Fasting Lipid Panel No results for input(s): CHOL, HDL, LDLCALC, TRIG, CHOLHDL, LDLDIRECT in the last 72 hours. Thyroid Function Tests No results for input(s): TSH, T4TOTAL, T3FREE, THYROIDAB in the last 72 hours.  Invalid input(s): FREET3  Other results:   Imaging    ECHOCARDIOGRAM LIMITED  Result Date: 04/27/2021    ECHOCARDIOGRAM LIMITED REPORT   Patient Name:   Brandon [name] Date of Exam: 04/27/2021 Medical Rec #:  [id number]      Height:       72.0 in Accession #:    [id number]     Weight:       175.5 lb Date of Birth:  1955/10/30       BSA:          2.015 m Patient Age:    65 years       BP:           0/0 mmHg Patient Gender: M              HR:           107 bpm. Exam Location:  Inpatient Procedure: 2D Echo                       STAT ECHO Reported to: Dr Phineas Inches on 04/27/2021 4:49:00 PM. Indications:    Chest pain  History:        Patient has prior history of Echocardiogram examinations.                 Defibrillator; Risk Factors:Hypertension and Dyslipidemia.                 04/06/21 Implanted 45mm Edwards Inspiris                 Resilia Aortic Valve Bioprosthetic and Heart Mate III LVAD.   Sonographer:    Clayton Lefort RDCS (AE) Referring Phys: [place]  Sonographer Comments: Technically difficult study due to poor echo windows. IMPRESSIONS  1. LV function is severely reduced. LVAD inflow cannula is visualized. The windows are poor. Small pericardial effusion. No cardiac tamponade. FINDINGS  Left Ventricle: LV function is severely reduced. LVAD inflow cannula is visualized. The windows are poor. Small pericardial effusion. No cardiac tamponade. Phineas Inches Electronically signed by Phineas Inches Signature Date/Time: 04/27/2021/5:06:03 PM    Final      Medications:     Scheduled Medications:  (feeding supplement) PROSource Plus  30 mL Oral TID PC & HS   sodium chloride   Intravenous Once   amiodarone  200 mg Oral Daily   aspirin EC  81 mg Oral Daily   atorvastatin  80 mg Oral Daily   Chlorhexidine Gluconate Cloth  6 each Topical Daily   digoxin  0.125 mg Oral Daily   docusate sodium  200 mg Oral Daily   doxycycline  100 mg Oral Q12H   eplerenone  25 mg Oral Daily   ezetimibe  10 mg Oral Daily   feeding supplement  237 mL Oral TID BM   folic acid  1 mg Oral Daily   gabapentin  300 mg Oral TID   insulin aspart  0-15 Units Subcutaneous TID WC   insulin detemir  15 Units Subcutaneous Daily   mouth rinse  15 mL Mouth Rinse BID   melatonin  5 mg Oral QHS   midodrine  2.5 mg Oral TID WC   multivitamin with minerals  1 tablet Oral Q lunch   pantoprazole  40 mg Oral Daily   polyethylene glycol  17 g Oral Daily   potassium chloride  40 mEq Oral BID   sertraline  100 mg Oral Daily   tamsulosin  0.4 mg Oral Daily   torsemide  40 mg Oral BID   vitamin B-12  100 mcg Oral Daily   warfarin  5 mg Oral ONCE-1600   Warfarin - Pharmacist Dosing Inpatient   Does not apply q1600    Infusions:  sodium chloride Stopped (04/14/21 1149)   cefTRIAXone (ROCEPHIN)  IV 2 g (04/28/21 1111)   heparin 400 Units/hr (04/28/21 1137)   lactated ringers Stopped (04/13/21 1053)    vancomycin 1,000 mg (04/28/21 1235)     PRN Medications: sodium chloride, acetaminophen, bisacodyl **OR** bisacodyl, lidocaine, morphine injection, ondansetron (ZOFRAN) IV, oxyCODONE, polyvinyl alcohol, sodium chloride flush, traMADol    Patient Profile   Mr Carmen is a 65 year old with history of smoker, CAD s/p previous MI, HTN, HL, chronic HFrEF, sepsis 11/2020 bacteremia.  Admitted with sepsis. Bld Cx - NGTD    Assessment/Plan   1. Acute on chronic systolic HFr EF -> cardiogenic shock - Echo 05/19/20 EF 20-25% RV mildly HK. moderate AS  Mean gradient 13 AVA 1.2 cm2 DI 0.30 - Persistent NYHA IV. Admitted with shock - s/p HM-III VAD + bioprosthetic AVR on 11/25 - RV looked good on TEE in OR - Continue digoxin 0.125.  - He looks euvolemic, no diuretic today.  - MAPs 70s-80s. Continue midodrine 2.5 tid  - Continue eplerenone 25 daily - Renal function stable.  - Stop drawing co-ox's, inaccurate w/o PICC.   2. HM-3 LVAD implant - Plan as above - VAD interrogated personally. Parameters stable. - INR 1.5  Continue ASA 81 for 30 days. Stop ASA 05/14/21  - Continue heparin gtt until INR 1.8.  - LDH stable  - Had ramp ECHO speed increased to 5800. Rare PI events.  3. Acute Hypoxic Respiratory Failure  - Extubated 11/26, stable on 2L [place].    4. 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 CAD  s/p anterior STEMI (12/20). LHC showed chronically occluded RCA (with L>>R collaterals) and thrombotic occlusion of proximal LAD. Underwent PCI of LAD. - No s/s angina - Continue atorvastatin 80.   6. Iron-deficiency anemia & ABLA - Rec'd Feraheme 11/8 and 11/13  - Keep hgb > 8.0,  9.5 today - Rec'd 1 unit PRBCs on 12/10 - 04/23/21 Iron sats low. Given feraheme  7. Hypokalemia/ Hypomagnesemia  - Keep K > 4.0 Mg > 2.0   8. Severe protein-calorie malnutrition - prealbumin 11>15> 13 >6.6 -  Continue aggressive nutritional support  9. Paroxysmal Atrial Fibrillation w/ RVR - new, developed 11/10. Maintaining SR/ST    - Off amio drip. Continue amio 200 mg once daily  - on warfarin/heparin. INR 1.5  today.   10. Hyponatremia - sodium 131>133>130-> 125>134 >131>128>129 - tolvaptan on 12/11.  - monitor   11. Sepsis - H/o strep bacteremia 11/2020.  ICD extracted 12/04/20. Barostim was not removed as it is extravascular.  - Received IV antibiotics until 01/15/21. Blood cultures negative 01/30/21..  - 10/26 -Blood CX x2  Negative  - Ct chest concerning for pneumonia. 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  Vancomycin added to doxycycline.  PCT < 0.1, WBCs 7, afebrile.  No further rigors.  Suspect no active infection but will continue vancomycin until culture data returns.   12. Hemorrhagic Pericarditis - Fibrin clot encasing heart at time of surgery.  Will need to be careful with anticoagulation (go slowly) - PharmD managing.   65. 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  - Bcx recollected and pending  - Tissue sent to [facility] in [place] for broad 16 S ribosomal sequencing for bacterial pathogens as well as T wet Bhilai Bartonella Brucella  - ID following, recommending  IV ceftriaxone to cover the Streptococcus gordonae that was isolated before given possible acute AoV endocarditis on surgical path. - Ceftriaxone 2 g IV daily x 6 weeks. End Date: May 22, 2021 - Home IV abx management has been arranged with [facility] and [facility]  14. Constipation - Resolved.    15. Bilateral pleural effusions - S/p Left  thoracentesis 12/8 but still with residual fluid. Will likely need repeat L tap in a few days - R thoracentesis 12/9.  - S/p repeat left thoracentesis 12/12 w/ 1L Fluid removal  - Repeat CXR 12/15 w/ small b/l effusions.  - continue to monitor. Repeat CXR if clinically indicated, may need Pleurex cath if significant re-accumulation    16. Left Shoulder Pain - post thoracentesis. Post procedure CXR negative for PTX  - AF. Normal WBCs - suspect musculoskeletal etiology. Normal AROM  - low suspicion for gout - much improved, continue supportive care - PRN tylenol, heating pad      Length of Stay: [place], MD  04/28/2021, 12:46 PM  Advanced Heart Failure Team Pager [phone] (M-F; 7a - 5p)  Please contact [facility] for night-coverage  after hours (5p -7a ) and weekends on [email or web address]

## 2021-04-28 NOTE — Progress Notes (Signed)
ANTICOAGULATION CONSULT NOTE   Pharmacy Consult for warfarin + heparin Indication:  s/p VAD (HM3) , afib, hx DVT  No Active Allergies  Patient Measurements: Height: 6' (182.9 cm) Weight: 79.7 kg (175 lb 11.3 oz) IBW/kg (Calculated) : 77.6   Vital Signs: Temp: 98.9 F (37.2 C) (12/17 0346) Temp Source: Oral (12/17 0346) BP: 132/75 (12/17 0417) Pulse Rate: 87 (12/17 0417)  Labs: Recent Labs    04/26/21 0420 04/27/21 0111 04/27/21 1811 04/28/21 0433  HGB 9.0* 8.9*  --  9.5*  HCT 29.3* 30.3*  --  30.9*  PLT 391 392  --  358  LABPROT 16.1* 16.9*  --  17.8*  INR 1.3* 1.4*  --  1.5*  HEPARINUNFRC <0.10* <0.10*  --  <0.10*  CREATININE 0.77 0.71  --  0.83  TROPONINIHS  --   --  253*  --      Estimated Creatinine Clearance: 94.8 mL/min (by C-G formula based on SCr of 0.83 mg/dL).   Medical History: Past Medical History:  Diagnosis Date   AICD (automatic cardioverter/defibrillator) present 08/31/2019   Ankylosing spondylitis ([place])    Arthritis    BENIGN PROSTATIC HYPERTROPHY 06/07/2008   CHF (congestive heart failure) ([place])    COLONIC POLYPS, HX OF 06/07/2008   Depression    H/O hiatal hernia    Heart failure ([place])    HYPERLIPIDEMIA 06/07/2008   HYPERTENSION 06/07/2008   Myocardial infarction [facility]) 2005   NSTEMI, s/p LAD stent   NEPHROLITHIASIS, HX OF 06/07/2008   STEMI (ST elevation myocardial infarction) ([place]) 04/27/2019    Assessment: 65 yo M s/p HM 3 and AVR on 04/06/21. Prior to surgery/admit patient was on apixiban for a DVT.  Pt s/p thora 12/8, 12/9, and 12/12. INR subtherapeutic after lower doses last week >  so heparin 400 uts/hr started at low-dose on 12/12 with heparin level daily. INR this morning  1.5 remains subtherapeutic - patient eating more and increased amount of ensure.  CBC and LDH stable, heparin level undetectable as expected.  Patient has received higher doses of 4, 5, and 5 mg the past 3 days. Would've expected to see effect of 4 mg dose  today, but INR not reflecting this.   Goal of Therapy:  Warfarin INR 2.0-2.5 Heparin Level <0.3 Monitor platelets by anticoagulation protocol: Yes   Plan:  Warfarin 5mg  PO x1 tonight repeat boost Heparin 400 units/h - no titrations Daily INR, heparin level, CBC, LDH ASA until 12/25  Cathrine Muster, PharmD PGY2 Cardiology Pharmacy Resident Phone: [phone] 04/28/2021  8:17 AM  Please check [email or web address] for unit-specific pharmacy phone numbers.    Please check AMION for all [facility] numbers 04/28/2021

## 2021-04-28 NOTE — Progress Notes (Signed)
CARDIAC REHAB PHASE I   PRE:  Rate/Rhythm: 89 SR  BP:  Sitting: (77)     MODE:  Ambulation: 275 ft   POST:  Rate/Rhythm: 106 ST  BP:  Sitting: (78)   Pt feeling some better today. PT switched from wall power to batteries with supervision and some assistance. Pt ambulated 261ft and returned to recliner. Pt c/o some leg fatigue. Pt assisted to recliner, RN at bedside. Printed out process for pt to follow consistently when swapping power sources. Pt with emesis episode with nurse, c/o not feeling well. Switched pt back to wall power for him. Encouraged continued OOB and mobility as able throughout the weekend. Will continue to follow.  [id number] Rufina Falco, RN BSN 04/28/2021 12:35 PM

## 2021-04-29 LAB — GLUCOSE, CAPILLARY
Glucose-Capillary: 115 mg/dL — ABNORMAL HIGH (ref 70–99)
Glucose-Capillary: 109 mg/dL — ABNORMAL HIGH (ref 70–99)
Glucose-Capillary: 125 mg/dL — ABNORMAL HIGH (ref 70–99)
Glucose-Capillary: 74 mg/dL (ref 70–99)

## 2021-04-29 LAB — PROTIME-INR
INR: 1.4 — ABNORMAL HIGH (ref 0.8–1.2)
Prothrombin Time: 16.8 seconds — ABNORMAL HIGH (ref 11.4–15.2)

## 2021-04-29 LAB — BASIC METABOLIC PANEL
Anion gap: 9 (ref 5–15)
BUN: 10 mg/dL (ref 8–23)
CO2: 31 mmol/L (ref 22–32)
Calcium: 8 mg/dL — ABNORMAL LOW (ref 8.9–10.3)
Chloride: 88 mmol/L — ABNORMAL LOW (ref 98–111)
Creatinine, Ser: 0.75 mg/dL (ref 0.61–1.24)
GFR, Estimated: >60 mL/min (ref >60)
Glucose, Bld: 89 mg/dL (ref 70–99)
Potassium: 3.3 mmol/L — ABNORMAL LOW (ref 3.5–5.1)
Sodium: 128 mmol/L — ABNORMAL LOW (ref 135–145)

## 2021-04-29 LAB — HEPARIN LEVEL (UNFRACTIONATED): Heparin Unfractionated: <0.1 IU/mL — ABNORMAL LOW (ref 0.30–0.70)

## 2021-04-29 LAB — RESP PANEL BY RT-PCR (FLU A&B, COVID) ARPGX2
Influenza A by PCR: NEGATIVE
Influenza B by PCR: NEGATIVE
SARS Coronavirus 2 by RT PCR: POSITIVE — AB

## 2021-04-29 LAB — PROCALCITONIN: Procalcitonin: <0.1 ng/mL

## 2021-04-29 LAB — LACTATE DEHYDROGENASE: LDH: 158 U/L (ref 98–192)

## 2021-04-29 MED ORDER — ASCORBIC ACID 500 MG PO TABS
500.0000 mg | ORAL_TABLET | Freq: Every day | ORAL | Status: DC
  Administered 2021-04-29 – 2021-05-09 (×11): 500 mg via ORAL
  Filled 2021-04-29 (×11): qty 1

## 2021-04-29 MED ORDER — SODIUM CHLORIDE 0.9 % IV SOLN
100.0000 mg | Freq: Every day | INTRAVENOUS | Status: AC
  Administered 2021-04-30 – 2021-05-03 (×4): 100 mg via INTRAVENOUS
  Filled 2021-04-29 (×5): qty 20

## 2021-04-29 MED ORDER — SODIUM CHLORIDE 0.9 % IV SOLN
200.0000 mg | Freq: Once | INTRAVENOUS | Status: AC
  Administered 2021-04-29: 19:00:00 200 mg via INTRAVENOUS
  Filled 2021-04-29: qty 40

## 2021-04-29 MED ORDER — WARFARIN SODIUM 5 MG PO TABS
7.0000 mg | ORAL_TABLET | Freq: Once | ORAL | Status: AC
  Administered 2021-04-29: 18:00:00 7 mg via ORAL
  Filled 2021-04-29: qty 1

## 2021-04-29 MED ORDER — GERHARDT'S BUTT CREAM
TOPICAL_CREAM | Freq: Every day | CUTANEOUS | Status: DC
  Administered 2021-05-03 – 2021-05-04 (×2): 1 via TOPICAL
  Filled 2021-04-29: qty 1

## 2021-04-29 MED ORDER — THIAMINE HCL 100 MG PO TABS
100.0000 mg | ORAL_TABLET | Freq: Every day | ORAL | Status: DC
  Administered 2021-04-29 – 2021-05-09 (×11): 100 mg via ORAL
  Filled 2021-04-29 (×11): qty 1

## 2021-04-29 MED ORDER — ZINC SULFATE 220 (50 ZN) MG PO CAPS
220.0000 mg | ORAL_CAPSULE | Freq: Every day | ORAL | Status: AC
  Administered 2021-04-29 – 2021-05-03 (×5): 220 mg via ORAL
  Filled 2021-04-29 (×5): qty 1

## 2021-04-29 NOTE — Progress Notes (Signed)
Patient walked well in hallway for appx 200 ft, on 2L oxygen with no shortness of breath and no dizziness--he still has slight confusion with how to switch batteries over to shoulder vest and then reconnecting to wall unit--I have notified Sarah/Vad Coord that patient's neighbor has tested positive for COVID today--was around the patient on Thursday 12/15 for dressing change, then began feeling bad on Friday 12/16---patient has felt a little "run down" since Friday and has periods of chills, afebrile, and nausea/vomiting coming and going--seems to be worse in morning and then he feels better in afternoon/evening--Sarah to order Covid test and notify Dr Aundra Dubin

## 2021-04-29 NOTE — Progress Notes (Signed)
Patient ID: Brandon [name], male   DOB: [birth year], 65 y.o.   MRN: [id number]      Advanced Heart Failure Rounding Note  PCP-Cardiologist: [name], MD  [facility]: Dr. [name]   Subjective:    Admitted with sepsis. Started on cefepime + vanc, now off abx (ID following). Afebrile.  10/29 Co-ox 48%, started on milrinone 0.25.  10/30 IV Antibiotics stopped. Blood cultures no growth for 5 days.  10/31 started on Augmentin for PNA.  11/1 Milrinone off 11/2 Milrinone added back. Midodrine increased to 10 mg TID 11/10 Developed Afib w/ RVR, converted with IV amio 11/14 Milrinone increased to 0.25 mcg. Diuresed with IV lasix  11/25 Underwent HM-3 VAD placement and bioprosthetic AVR. Had persistent hypotension in OR requiring methylene blue and high dose pressors.  11/26 Extubated 11/28 Started on lasix gtt>>later stopped due to low CVP (1) and increasing pressor requirements. Restarted on scheduled BID IV Lasix once pressure stabilized (still volume up)  11/28 Surgical path with evidence of possible "acute AoV endocarditis". ID consulted. Bcx recollected. Tissue sent to [facility] in [place].   11/30 Started on IV ceftriaxone to cover the Streptococcus gordonae that was isolated before, per ID. 11/30 VAD speed increased to 5600 12/1 Diuresed with IV lasix + metolazone. Milrinone was stopped. Amio drip transitioned to amio 200 mg twice a day.  12/5 Ramp Echo speed increased to 5500. Epi stopped.  12/6 Transferred to [place]. Had BM. Started on spiro 12.5 mg daily 12/8 CXR with significant bilateral pleural effusions. S/p Left Thoracentesis w/ 1.7 L out but still with residual fluid 12/9 Rt Thoracentesis  12/11 Given dose of tolvaptan and diuresed with IV lasix. Negative 2.5 liters.  12/12 Repeat Lt Thoracentesis w/ 1L Fluid removal   Afebrile, most recent blood cultures NGTD. WBCs normal.  Remains on ceftriaxone and vancomycin.    He feels fine currently, had nausea earlier today.   LDH  127>>135>>152>>158. INR still low 1.4 and on heparin gtt.   Weight down 1 lb.  Only complaint is cough, has had chronically.   LVAD Interrogation HM 3: Speed: 5800 Flow: 5.0 PI: 3.7  Power: 4.7. Parameters stable.    Objective:   Weight Range: 79.1 kg Body mass index is 23.65 kg/m.   Vital Signs:   Temp:  [97.6 F (36.4 C)-98.9 F (37.2 C)] 98.9 F (37.2 C) (12/18 0811) Pulse Rate:  [70-88] 70 (12/18 0811) Resp:  [16-20] 17 (12/18 0811) BP: (76-87)/(54-73) 76/58 (12/18 0811) SpO2:  [90 %-97 %] 90 % (12/18 0811) Weight:  [79.1 kg] 79.1 kg (12/18 0410) Last BM Date: 04/21/21  Weight change: Filed Weights   04/27/21 0503 04/28/21 0417 04/29/21 0410  Weight: 79.6 kg 79.7 kg 79.1 kg    Intake/Output:   Intake/Output Summary (Last 24 hours) at 04/29/2021 1144 Last data filed at 04/29/2021 0811 Gross per 24 hour  Intake 1567.8 ml  Output 4445 ml  Net -2877.2 ml     Physical Exam   MAPs 70s  General: Well appearing this am. NAD.  HEENT: Normal. Neck: Supple, JVP 7-8 cm. Carotids OK.  Cardiac:  Mechanical heart sounds with LVAD hum present.  Lungs:  CTAB, normal effort.  Abdomen:  NT, ND, no HSM. No bruits or masses. +BS  LVAD exit site: Well-healed and incorporated. Dressing dry and intact. No erythema or drainage. Stabilization device present and accurately applied. Driveline dressing changed daily per sterile technique. Extremities:  Warm and dry. No cyanosis, clubbing, rash. 1+ ankle edema.  Neuro:  Alert & oriented x 3. Cranial nerves grossly intact. Moves all 4 extremities w/o difficulty. Affect pleasant     Telemetry   NSR. Personally reviewed   Labs    CBC Recent Labs    04/27/21 0111 04/28/21 0433  WBC 10.6* 7.1  HGB 8.9* 9.5*  HCT 30.3* 30.9*  MCV 81.5 79.4*  PLT 392 782   Basic Metabolic Panel Recent Labs    04/28/21 0433 04/29/21 0036  NA 129* 128*  K 4.0 3.3*  CL 89* 88*  CO2 32 31  GLUCOSE 81 89  BUN 14 10  CREATININE 0.83 0.75   CALCIUM 8.8* 8.0*   Liver Function Tests No results for input(s): AST, ALT, ALKPHOS, BILITOT, PROT, ALBUMIN in the last 72 hours.    No results for input(s): LIPASE, AMYLASE in the last 72 hours. Cardiac Enzymes No results for input(s): CKTOTAL, CKMB, CKMBINDEX, TROPONINI in the last 72 hours.  BNP: BNP (last 3 results) Recent Labs    04/13/21 0333 04/20/21 0600 04/26/21 0420  BNP 438.2* 193.0* 273.9*    ProBNP (last 3 results) No results for input(s): PROBNP in the last 8760 hours.    D-Dimer No results for input(s): DDIMER in the last 72 hours. Hemoglobin A1C No results for input(s): HGBA1C in the last 72 hours.  Fasting Lipid Panel No results for input(s): CHOL, HDL, LDLCALC, TRIG, CHOLHDL, LDLDIRECT in the last 72 hours. Thyroid Function Tests No results for input(s): TSH, T4TOTAL, T3FREE, THYROIDAB in the last 72 hours.  Invalid input(s): FREET3  Other results:   Imaging    No results found.   Medications:     Scheduled Medications:  (feeding supplement) PROSource Plus  30 mL Oral TID PC & HS   sodium chloride   Intravenous Once   amiodarone  200 mg Oral Daily   aspirin EC  81 mg Oral Daily   atorvastatin  80 mg Oral Daily   digoxin  0.125 mg Oral Daily   docusate sodium  200 mg Oral Daily   doxycycline  100 mg Oral Q12H   eplerenone  25 mg Oral Daily   ezetimibe  10 mg Oral Daily   feeding supplement  237 mL Oral TID BM   folic acid  1 mg Oral Daily   gabapentin  300 mg Oral TID   insulin aspart  0-15 Units Subcutaneous TID WC   insulin detemir  15 Units Subcutaneous Daily   mouth rinse  15 mL Mouth Rinse BID   melatonin  5 mg Oral QHS   midodrine  2.5 mg Oral TID WC   multivitamin with minerals  1 tablet Oral Q lunch   pantoprazole  40 mg Oral Daily   polyethylene glycol  17 g Oral Daily   potassium chloride  40 mEq Oral BID   sertraline  100 mg Oral Daily   tamsulosin  0.4 mg Oral Daily   torsemide  40 mg Oral BID   vitamin B-12   100 mcg Oral Daily   warfarin  7 mg Oral ONCE-1600   Warfarin - Pharmacist Dosing Inpatient   Does not apply q1600    Infusions:  sodium chloride Stopped (04/14/21 1149)   cefTRIAXone (ROCEPHIN)  IV 2 g (04/29/21 1106)   heparin 400 Units/hr (04/29/21 0811)   lactated ringers Stopped (04/13/21 1053)   vancomycin Stopped (04/28/21 2205)     PRN Medications: sodium chloride, acetaminophen, bisacodyl **OR** bisacodyl, lidocaine, morphine injection, ondansetron (ZOFRAN) IV, oxyCODONE, polyvinyl alcohol, sodium chloride flush,  traMADol    Patient Profile   Mr Wellbrock is a 65 year old with history of smoker, CAD s/p previous MI, HTN, HL, chronic HFrEF, sepsis 11/2020 bacteremia.  Admitted with sepsis. Bld Cx - NGTD    Assessment/Plan   1. Acute on chronic systolic HFr EF -> cardiogenic shock - Echo 05/19/20 EF 20-25% RV mildly HK. moderate AS  Mean gradient 13 AVA 1.2 cm2 DI 0.30 - Persistent NYHA IV. Admitted with shock - s/p HM-III VAD + bioprosthetic AVR on 11/25 - RV looked good on TEE in OR - Continue digoxin 0.125.  - He looks euvolemic, no diuretic today.  - MAPs 70s. Continue midodrine 2.5 tid  - Continue eplerenone 25 daily - Renal function stable.   2. HM-3 LVAD implant - Plan as above - VAD interrogated personally. Parameters stable. - INR 1.4  Continue ASA 81 for 30 days. Stop ASA 05/14/21  - Continue heparin gtt until INR 1.8 (need to increase warfarin).  - LDH stable  - Had ramp ECHO speed increased to 5800. Rare PI events.   3. Acute Hypoxic Respiratory Failure  - Extubated 11/26, stable on 2L [place].    4. 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 CAD  s/p anterior STEMI (12/20). LHC showed chronically occluded RCA (with L>>R collaterals) and thrombotic occlusion of proximal LAD. Underwent PCI of LAD. - No s/s angina - Continue atorvastatin 80.   6.  Iron-deficiency anemia & ABLA - Rec'd Feraheme 11/8 and 11/13  - Keep hgb > 8.0,  8.9 today - Rec'd 1 unit PRBCs on 12/10 - 04/23/21 Iron sats low. Given feraheme  7. Hypokalemia/ Hypomagnesemia  - Keep K > 4.0 Mg > 2.0   8. Severe protein-calorie malnutrition - prealbumin 11>15> 13 >6.6 - Continue aggressive nutritional support  9. Paroxysmal Atrial Fibrillation w/ RVR - new, developed 11/10. Maintaining SR/ST    - Off amio drip. Continue amio 200 mg once daily  - on warfarin/heparin. INR 1.4  today.   10. Hyponatremia - sodium 131>133>130-> 125>134 >131>128>129>128 - tolvaptan on 12/11.  - monitor   11. Sepsis - H/o strep bacteremia 11/2020.  ICD extracted 12/04/20. Barostim was not removed as it is extravascular.  - Received IV antibiotics until 01/15/21. Blood cultures negative 01/30/21..  - 10/26 -Blood CX x2  Negative  - Ct chest concerning for pneumonia. 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  Vancomycin added to doxycycline.  PCT < 0.1, WBCs 7, afebrile.  No further rigors.  Suspect no active infection.  Cultures NGTD, will stop vancomycin.   12. Hemorrhagic Pericarditis - Fibrin clot encasing heart at time of surgery.  Will need to be careful with anticoagulation (go slowly) - PharmD managing.   40. 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  - Bcx recollected and pending  - Tissue sent to [facility] in [place] for broad 16 S ribosomal sequencing for bacterial pathogens as well as T wet Bhilai Bartonella Brucella  - ID following, recommending  IV ceftriaxone to cover the Streptococcus gordonae that was isolated before  given possible acute AoV  endocarditis on surgical path. - Ceftriaxone 2 g IV daily x 6 weeks. End Date: May 22, 2021 - Home IV abx management has been arranged with [facility] and [facility]  14. Constipation - Resolved.    15. Bilateral pleural effusions - S/p Left thoracentesis 12/8 but still with residual fluid. Will likely need repeat L tap in a few days - R thoracentesis 12/9.  - S/p repeat left thoracentesis 12/12 w/ 1L Fluid removal  - Repeat CXR 12/15 w/ small b/l effusions.  - continue to monitor. Repeat CXR if clinically indicated, may need Pleurex cath if significant re-accumulation   - Still with chronic cough.   16. Left Shoulder Pain - post thoracentesis. Post procedure CXR negative for PTX  - AF. Normal WBCs - suspect musculoskeletal etiology. Normal AROM  - low suspicion for gout - much improved, continue supportive care - PRN tylenol, heating pad     Length of Stay: [place], MD  04/29/2021, 11:44 AM  Advanced Heart Failure Team Pager [phone] (M-F; 7a - 5p)  Please contact [facility] for night-coverage after hours (5p -7a ) and weekends on [email or web address]

## 2021-04-29 NOTE — Progress Notes (Signed)
Received page from pt. Pt states that his friend who visited Thursday tested + for COVID today. Pt/nurse tell me that the pt has not been feeling well today. Pt states "I feel run down." The pt states that he has chills on and off since early this morning. D/w Dr [name], I have ordered Covid test to r/o Covid. Pt/nurse in agreement.  Tanda Rockers RN, BSN VAD Coordinator 24/7 Pager [phone]

## 2021-04-29 NOTE — Progress Notes (Signed)
Per michael/pharm---ok to give remdesivir IV with patient taking amiodarone tablet

## 2021-04-29 NOTE — Progress Notes (Signed)
ANTICOAGULATION CONSULT NOTE   Pharmacy Consult for warfarin + heparin Indication:  s/p VAD (HM3) , afib, hx DVT  No Active Allergies  Patient Measurements: Height: 6' (182.9 cm) Weight: 79.1 kg (174 lb 6.1 oz) IBW/kg (Calculated) : 77.6   Vital Signs: Temp: 98.2 F (36.8 C) (12/18 0410) Temp Source: Oral (12/18 0410) BP: 83/72 (12/18 0410) Pulse Rate: 80 (12/18 0410)  Labs: Recent Labs    04/27/21 0111 04/27/21 1811 04/28/21 0433 04/29/21 0036  HGB 8.9*  --  9.5*  --   HCT 30.3*  --  30.9*  --   PLT 392  --  358  --   LABPROT 16.9*  --  17.8* 16.8*  INR 1.4*  --  1.5* 1.4*  HEPARINUNFRC <0.10*  --  <0.10* <0.10*  CREATININE 0.71  --  0.83 0.75  TROPONINIHS  --  253*  --   --      Estimated Creatinine Clearance: 98.3 mL/min (by C-G formula based on SCr of 0.75 mg/dL).   Medical History: Past Medical History:  Diagnosis Date   AICD (automatic cardioverter/defibrillator) present 08/31/2019   Ankylosing spondylitis ([place])    Arthritis    BENIGN PROSTATIC HYPERTROPHY 06/07/2008   CHF (congestive heart failure) ([place])    COLONIC POLYPS, HX OF 06/07/2008   Depression    H/O hiatal hernia    Heart failure ([place])    HYPERLIPIDEMIA 06/07/2008   HYPERTENSION 06/07/2008   Myocardial infarction [facility]) 2005   NSTEMI, s/p LAD stent   NEPHROLITHIASIS, HX OF 06/07/2008   STEMI (ST elevation myocardial infarction) ([place]) 04/27/2019    Assessment: 65 yo M s/p HM 3 and AVR on 04/06/21. Prior to surgery/admit patient was on apixiban for a DVT.  Pt s/p thora 12/8, 12/9, and 12/12. INR subtherapeutic after lower doses last week  so low dose fixed heparin at 400 uts/hr started on 12/12 with heparin level daily. INR this morning  1.5 remains subtherapeutic, despite boosted dose of 5 mg the past 3 days. Would've expected to start and see this reflected in INR, but remaining stable.   Spoke with the patient's RN who had him yesterday as well and states he hasn't been eating that much  and has been nauseous and vomiting. Questionable wether he is absorbing warfarin. Appetite has been poor the past few days. Will give increased/boosted dose today to try and get INR to increase.   Heparin level undectable at <0.1 as expected on 400 units/hr. No issues with bleeding per RN.  Goal of Therapy:  Warfarin INR 2.0-2.5 Heparin Level <0.3 Monitor platelets by anticoagulation protocol: Yes   Plan:  Warfarin 7 mg PO x1 tonight increased boost Heparin 400 units/h - no titrations Daily INR, heparin level, CBC, LDH ASA until 12/25  Cathrine Muster, PharmD PGY2 Cardiology Pharmacy Resident Phone: [phone] 04/29/2021  7:45 AM  Please check [email or web address] for unit-specific pharmacy phone numbers.

## 2021-04-30 LAB — BASIC METABOLIC PANEL
Anion gap: 8 (ref 5–15)
BUN: 11 mg/dL (ref 8–23)
CO2: 33 mmol/L — ABNORMAL HIGH (ref 22–32)
Calcium: 8.3 mg/dL — ABNORMAL LOW (ref 8.9–10.3)
Chloride: 91 mmol/L — ABNORMAL LOW (ref 98–111)
Creatinine, Ser: 0.95 mg/dL (ref 0.61–1.24)
GFR, Estimated: >60 mL/min (ref >60)
Glucose, Bld: 93 mg/dL (ref 70–99)
Potassium: 3.5 mmol/L (ref 3.5–5.1)
Sodium: 132 mmol/L — ABNORMAL LOW (ref 135–145)

## 2021-04-30 LAB — CBC
HCT: 31 % — ABNORMAL LOW (ref 39.0–52.0)
Hemoglobin: 9.3 g/dL — ABNORMAL LOW (ref 13.0–17.0)
MCH: 23.9 pg — ABNORMAL LOW (ref 26.0–34.0)
MCHC: 30 g/dL (ref 30.0–36.0)
MCV: 79.7 fL — ABNORMAL LOW (ref 80.0–100.0)
Platelets: 300 10*3/uL (ref 150–400)
RBC: 3.89 MIL/uL — ABNORMAL LOW (ref 4.22–5.81)
RDW: 19.6 % — ABNORMAL HIGH (ref 11.5–15.5)
WBC: 5.3 10*3/uL (ref 4.0–10.5)
nRBC: 0 % (ref 0.0–0.2)

## 2021-04-30 LAB — PROTIME-INR
INR: 1.3 — ABNORMAL HIGH (ref 0.8–1.2)
Prothrombin Time: 16.4 seconds — ABNORMAL HIGH (ref 11.4–15.2)

## 2021-04-30 LAB — C-REACTIVE PROTEIN: CRP: 2.5 mg/dL — ABNORMAL HIGH (ref <1.0)

## 2021-04-30 LAB — FERRITIN: Ferritin: 1464 ng/mL — ABNORMAL HIGH (ref 24–336)

## 2021-04-30 LAB — GLUCOSE, CAPILLARY
Glucose-Capillary: 87 mg/dL (ref 70–99)
Glucose-Capillary: 126 mg/dL — ABNORMAL HIGH (ref 70–99)
Glucose-Capillary: 98 mg/dL (ref 70–99)
Glucose-Capillary: 118 mg/dL — ABNORMAL HIGH (ref 70–99)

## 2021-04-30 LAB — MAGNESIUM: Magnesium: 1.5 mg/dL — ABNORMAL LOW (ref 1.7–2.4)

## 2021-04-30 LAB — D-DIMER, QUANTITATIVE: D-Dimer, Quant: 6.83 ug/mL-FEU — ABNORMAL HIGH (ref 0.00–0.50)

## 2021-04-30 LAB — HEPARIN LEVEL (UNFRACTIONATED): Heparin Unfractionated: <0.1 IU/mL — ABNORMAL LOW (ref 0.30–0.70)

## 2021-04-30 LAB — PHOSPHORUS: Phosphorus: 4 mg/dL (ref 2.5–4.6)

## 2021-04-30 LAB — LACTATE DEHYDROGENASE: LDH: 144 U/L (ref 98–192)

## 2021-04-30 MED ORDER — WARFARIN SODIUM 7.5 MG PO TABS
7.5000 mg | ORAL_TABLET | Freq: Once | ORAL | Status: AC
  Administered 2021-04-30: 17:00:00 7.5 mg via ORAL
  Filled 2021-04-30: qty 1

## 2021-04-30 MED ORDER — INSULIN DETEMIR 100 UNIT/ML ~~LOC~~ SOLN
10.0000 [IU] | Freq: Every day | SUBCUTANEOUS | Status: DC
  Administered 2021-05-01: 10:00:00 10 [IU] via SUBCUTANEOUS
  Filled 2021-04-30 (×2): qty 0.1

## 2021-04-30 MED ORDER — MAGNESIUM SULFATE 4 GM/100ML IV SOLN
4.0000 g | Freq: Once | INTRAVENOUS | Status: AC
  Administered 2021-04-30: 14:00:00 4 g via INTRAVENOUS
  Filled 2021-04-30: qty 100

## 2021-04-30 NOTE — Plan of Care (Signed)
  Problem: Education: Goal: Knowledge of General Education information will improve Description: Including pain rating scale, medication(s)/side effects and non-pharmacologic comfort measures Outcome: Progressing   Problem: Health Behavior/Discharge Planning: Goal: Ability to manage health-related needs will improve Outcome: Progressing   Problem: Clinical Measurements: Goal: Ability to maintain clinical measurements within normal limits will improve Outcome: Progressing Goal: Diagnostic test results will improve Outcome: Progressing   Problem: Activity: Goal: Risk for activity intolerance will decrease Outcome: Progressing   Problem: Pain Managment: Goal: General experience of comfort will improve Outcome: Progressing   

## 2021-04-30 NOTE — Progress Notes (Signed)
Upon assessing patient and administering medications, patient noticed a new slight pain in his mid left chest at the level of the sternum. He says this is new and he just noticed this now. Rates 3/10 and states worse with deep inspiration. No other changes, vitals stable. Page sent to HF team. Awaiting call back.

## 2021-04-30 NOTE — Progress Notes (Signed)
LVAD Coordinator Rounding Note:  Admitted 03/07/21 due to sepsis. Dr. [name] consulted as his HF Cardiologist.   HM III LVAD implanted on 04/06/21 by Dr. Cyndia Bent under Destination Therapy criteria. Not a transplant candidate at this time due to recurrent infections.   Pt sitting up in bed this am. Covid restrictions in place. Pt says he has had chills and rigors a few times over weekend, but feels "okay" this morning.   Vital signs: Temp: 97.6 HR: 75 Doppler: 96 Automatic BP: 100/91 (95) O2 Sat: 96% on RA Wt:171.5>190>187.4>187>...177.6>176.15>174.8>171.3>174.3>172.4>175.4>175>174>167 lbs     LVAD interrogation reveals:  Speed: 5800 Flow: 5.2 Power: 4.8w PI: 3.3  Alarms: none Events: > 80 PI events on 04/29/21 Hematocrit: 30  Fixed speed: 5800 Low speed limit: 5500  Drive Line:  Existing VAD dressing removed and site care performed using sterile technique. Drive line exit site cleaned with Chlora prep applicators x 2, allowed to dry, and gauze dressing with silver strip re-applied. Exit site healed and incorporated, the velour is fully implanted at exit site. Scant dried bloody drainage. Slight redness, no tenderness, foul odor or rash noted. Drive line anchor re-applied. Continue twice a week dressing changes on Monday and Thursday per VAD Coordinator, Nurse Davonna Belling, or Tomi Bamberger. Next dressing change due 05/03/21.    Labs:  LDH trend: 309>248>215>206>201........193>160>141>115>127>135>152>158>158  INR trend: 1.6>1.3>1.3>1.4>1.5>1.6>1.8>2.0>2.0>2.0>1.8>1.7>1.4>1.5>1.3>1.4>1.5>1.4>1.6  WBC: 8.2>9.2>10.6>7.1>5.3  Anticoagulation Plan: -INR Goal: 2 - 2.5 -ASA Dose: 81 mg daily  Blood Products:   Intra-op: - 04/06/21>>4 PRBCs, 4 FFP, 1263 cc cell saver  Post-op: - 04/06/21>>1 unit Plts - 04/07/21>>1 unit PRBC  Device: N/A  Arrythmias: 03/22/21 - developed Afib w/ RVR, converted with IV amiodarone  Respiratory: extubated 04/07/21  Nitric Oxide: off  04/07/21  Gtts: Heparin 400 u/hr  Infection: - 04/11/21 Blood cultures x 2>> no growth x 5 days; final - 04/19/21 Body fluid cx (left lung)>> no growth <24 hours - 04/19/21 Gram stain (left lung)>> no WBC or organisms seen; final - 04/20/21 Gram stain (right lung)>> rare WBC, no organisms seen; final - 04/27/21 blood culture x 2>> pending - 04/27/21 Sputum>> negative - 04/27/21 Urine>>negative - 04/28/21 Covid>>positive   Patient/Family Teaching:  VAD discharge teaching completed with patient, his sister [name], and his friend Tomi Bamberger. See separate note for documentation.  Tomi Bamberger able to do dressing changes with bedside nurse.  Plan/Recommendations:  1. Call VAD Coordinator for any VAD equipment or drive line issues. 2.  Twice a week dressing changes per VAD Coordinator, Nurse Davonna Belling, or trained caregiver. 3. VAD discharge teaching completed with patient and family.    Zada Girt RN [place] Coordinator  Office: [phone]  24/7 Pager: [phone]

## 2021-04-30 NOTE — Progress Notes (Addendum)
Patient ID: Brandon TOWERY, male   DOB: 10/31/1955, 65 y.o.   MRN: [id number]      Advanced Heart Failure Rounding Note  PCP-Cardiologist: [name], MD  [facility]: Dr. [name]   Subjective:    Admitted with sepsis. Started on cefepime + vanc, now off abx (ID following). Afebrile.  10/29 Co-ox 48%, started on milrinone 0.25.  10/30 IV Antibiotics stopped. Blood cultures no growth for 5 days.  10/31 started on Augmentin for PNA.  11/1 Milrinone off 11/2 Milrinone added back. Midodrine increased to 10 mg TID 11/10 Developed Afib w/ RVR, converted with IV amio 11/14 Milrinone increased to 0.25 mcg. Diuresed with IV lasix  11/25 Underwent HM-3 VAD placement and bioprosthetic AVR. Had persistent hypotension in OR requiring methylene blue and high dose pressors.  11/26 Extubated 11/28 Started on lasix gtt>>later stopped due to low CVP (1) and increasing pressor requirements. Restarted on scheduled BID IV Lasix once pressure stabilized (still volume up)  11/28 Surgical path with evidence of possible "acute AoV endocarditis". ID consulted. Bcx recollected. Tissue sent to [facility] in [place].   11/30 Started on IV ceftriaxone to cover the Streptococcus gordonae that was isolated before, per ID. 11/30 VAD speed increased to 5600 12/1 Diuresed with IV lasix + metolazone. Milrinone was stopped. Amio drip transitioned to amio 200 mg twice a day.  12/5 Ramp Echo speed increased to 5500. Epi stopped.  12/6 Transferred to [place]. Had BM. Started on spiro 12.5 mg daily 12/8 CXR with significant bilateral pleural effusions. S/p Left Thoracentesis w/ 1.7 L out but still with residual fluid 12/9 Rt Thoracentesis  12/11 Given dose of tolvaptan and diuresed with IV lasix. Negative 2.5 liters.  12/12 Repeat Lt Thoracentesis w/ 1L Fluid removal  12/18 + SARS2  LDH 144, INR still low 1.3 and on heparin gtt.   Complaining of chills and fatigue.   LVAD Interrogation HM 3: Speed: 5800 Flow: 5.3 PI: 3.3  Power: 5.  Parameters stable.    Objective:   Weight Range: 76 kg Body mass index is 22.72 kg/m.   Vital Signs:   Temp:  [97.6 F (36.4 C)-98.1 F (36.7 C)] 97.8 F (36.6 C) (12/19 0913) Pulse Rate:  [63-124] 74 (12/19 0913) Resp:  [14-20] 14 (12/19 0338) BP: (97-110)/(68-91) 100/91 (12/19 0900) SpO2:  [93 %-99 %] 96 % (12/19 0913) Weight:  [76 kg] 76 kg (12/19 0345) Last BM Date: 04/21/21  Weight change: Filed Weights   04/28/21 0417 04/29/21 0410 04/30/21 0345  Weight: 79.7 kg 79.1 kg 76 kg    Intake/Output:   Intake/Output Summary (Last 24 hours) at 04/30/2021 1048 Last data filed at 04/30/2021 0901 Gross per 24 hour  Intake 1908.3 ml  Output 2725 ml  Net -816.7 ml     Physical Exam   MAPs 80-90s  Physical Exam: GENERAL: No acute distress. HEENT: normal  NECK: Supple, JVP  5-6 .  2+ bilaterally, no bruits.  No lymphadenopathy or thyromegaly appreciated.   CARDIAC:  Mechanical heart sounds with LVAD hum present.  LUNGS:  Clear to auscultation bilaterally.  ABDOMEN:  Soft, round, nontender, positive bowel sounds x4.     LVAD exit site:  Dressing dry and intact.  No erythema or drainage.  Stabilization device present and accurately applied.  Driveline dressing is being changed daily per sterile technique. EXTREMITIES:  Warm and dry, no cyanosis, clubbing, rash or edema  NEUROLOGIC:  Alert and oriented x 3.    No aphasia.  No dysarthria.  Affect  pleasant.     Telemetry   SR 70-80s   Labs    CBC Recent Labs    04/28/21 0433 04/30/21 0510  WBC 7.1 5.3  HGB 9.5* 9.3*  HCT 30.9* 31.0*  MCV 79.4* 79.7*  PLT 358 161   Basic Metabolic Panel Recent Labs    04/28/21 0433 04/29/21 0036 04/30/21 0510  NA 129* 128*  --   K 4.0 3.3*  --   CL 89* 88*  --   CO2 32 31  --   GLUCOSE 81 89  --   BUN 14 10  --   CREATININE 0.83 0.75  --   CALCIUM 8.8* 8.0*  --   MG  --   --  1.5*  PHOS  --   --  4.0   Liver Function Tests No results for input(s): AST, ALT,  ALKPHOS, BILITOT, PROT, ALBUMIN in the last 72 hours.    No results for input(s): LIPASE, AMYLASE in the last 72 hours. Cardiac Enzymes No results for input(s): CKTOTAL, CKMB, CKMBINDEX, TROPONINI in the last 72 hours.  BNP: BNP (last 3 results) Recent Labs    04/13/21 0333 04/20/21 0600 04/26/21 0420  BNP 438.2* 193.0* 273.9*    ProBNP (last 3 results) No results for input(s): PROBNP in the last 8760 hours.    D-Dimer Recent Labs    04/30/21 0510  DDIMER 6.83*   Hemoglobin A1C No results for input(s): HGBA1C in the last 72 hours.  Fasting Lipid Panel No results for input(s): CHOL, HDL, LDLCALC, TRIG, CHOLHDL, LDLDIRECT in the last 72 hours. Thyroid Function Tests No results for input(s): TSH, T4TOTAL, T3FREE, THYROIDAB in the last 72 hours.  Invalid input(s): FREET3  Other results:   Imaging    No results found.   Medications:     Scheduled Medications:  (feeding supplement) PROSource Plus  30 mL Oral TID PC & HS   sodium chloride   Intravenous Once   amiodarone  200 mg Oral Daily   vitamin C  500 mg Oral Daily   aspirin EC  81 mg Oral Daily   atorvastatin  80 mg Oral Daily   digoxin  0.125 mg Oral Daily   docusate sodium  200 mg Oral Daily   doxycycline  100 mg Oral Q12H   eplerenone  25 mg Oral Daily   ezetimibe  10 mg Oral Daily   feeding supplement  237 mL Oral TID BM   folic acid  1 mg Oral Daily   gabapentin  300 mg Oral TID   Gerhardt's butt cream   Topical Daily   insulin aspart  0-15 Units Subcutaneous TID WC   insulin detemir  15 Units Subcutaneous Daily   mouth rinse  15 mL Mouth Rinse BID   melatonin  5 mg Oral QHS   midodrine  2.5 mg Oral TID WC   multivitamin with minerals  1 tablet Oral Q lunch   pantoprazole  40 mg Oral Daily   polyethylene glycol  17 g Oral Daily   potassium chloride  40 mEq Oral BID   sertraline  100 mg Oral Daily   tamsulosin  0.4 mg Oral Daily   thiamine  100 mg Oral Daily   torsemide  40 mg Oral BID    vitamin B-12  100 mcg Oral Daily   Warfarin - Pharmacist Dosing Inpatient   Does not apply q1600   zinc sulfate  220 mg Oral Daily    Infusions:  sodium chloride Stopped (  04/14/21 1149)   cefTRIAXone (ROCEPHIN)  IV 2 g (04/30/21 0859)   heparin 400 Units/hr (04/30/21 0000)   lactated ringers Stopped (04/13/21 1053)   remdesivir 100 mg in NS 100 mL       PRN Medications: sodium chloride, acetaminophen, bisacodyl **OR** bisacodyl, lidocaine, morphine injection, ondansetron (ZOFRAN) IV, oxyCODONE, polyvinyl alcohol, sodium chloride flush, traMADol    Patient Profile   Mr Dills is a 65 year old with history of smoker, CAD s/p previous MI, HTN, HL, chronic HFrEF, sepsis 11/2020 bacteremia.  Admitted with sepsis. Bld Cx - NGTD    Assessment/Plan   1. Acute on chronic systolic HFr EF -> cardiogenic shock - Echo 05/19/20 EF 20-25% RV mildly HK. moderate AS  Mean gradient 13 AVA 1.2 cm2 DI 0.30 - Persistent NYHA IV. Admitted with shock - s/p HM-III VAD + bioprosthetic AVR on 11/25 - RV looked good on TEE in OR - Continue digoxin 0.125.  -  MAPs 80-90s . Continue midodrine 2.5 tid  - Continue eplerenone 25 daily - Renal function stable.   2. HM-3 LVAD implant - Plan as above - VAD interrogated personally. Parameters stable. - INR 1.3   Continue ASA 81 for 30 days. Stop ASA 05/14/21  - Continue heparin gtt until INR 1.8 (need to increase warfarin).  - LDH stable  - Had ramp ECHO speed increased to 5800. Rare PI events.   3. Acute Hypoxic Respiratory Failure  - Extubated 11/26, stable on 2L [place].    4. 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 CAD  s/p anterior STEMI (12/20). LHC showed chronically occluded RCA (with L>>R collaterals) and thrombotic occlusion of proximal LAD. Underwent PCI of LAD. - No s/s angina - Continue atorvastatin 80.   6. Iron-deficiency anemia & ABLA -  Rec'd Feraheme 11/8 and 11/13  - Keep hgb > 8.0,  9.3 today - Rec'd 1 unit PRBCs on 12/10 - 04/23/21 Iron sats low. Given feraheme  7. Hypokalemia/ Hypomagnesemia  - Keep K > 4.0 Mg > 2.0  - Replacing Mag.   8. Severe protein-calorie malnutrition - prealbumin 11>15> 13 >6.6 - Continue aggressive nutritional support  9. Paroxysmal Atrial Fibrillation w/ RVR - new, developed 11/10. Maintaining SR/ST    - Off amio drip. Continue amio 200 mg once daily  - on warfarin/heparin. INR 1.3 today.   10. Hyponatremia - sodium pending  - tolvaptan on 12/11.  - monitor   11. Sepsis - H/o strep bacteremia 11/2020.  ICD extracted 12/04/20. Barostim was not removed as it is extravascular.  - Received IV antibiotics until 01/15/21. Blood cultures negative 01/30/21..  - 10/26 -Blood CX x2  Negative  - Ct chest concerning for pneumonia. 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   PCT < 0.1, WBCs 7, afebrile.  Cultures NGTD,  vancomycin stopped.   - 12/18 + SARS 2. See below.   12. Hemorrhagic Pericarditis - Fibrin clot encasing heart at time of surgery.  Will need to be careful with anticoagulation (go slowly) - PharmD managing.   70. 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 - Ceftriaxone 2 g IV daily x 6 weeks. End  Date: May 22, 2021 - Home IV abx management has been arranged with [facility] and [place] out can replace once improved.   14. Constipation - Resolved.    15. Bilateral pleural effusions - S/p Left thoracentesis 12/8 but still with residual fluid. Will likely need repeat L tap in a few days - R thoracentesis 12/9.  - S/p repeat left thoracentesis 12/12 w/ 1L Fluid removal  - Repeat CXR 12/15 w/ small b/l effusions.  - Lungs sound ok today.   16. Left Shoulder Pain - post thoracentesis. Post procedure CXR negative for PTX  - WBC 5.3  - suspect musculoskeletal etiology. Normal AROM  - low suspicion for gout - much improved, continue supportive care - PRN tylenol, heating pad    17. SARS2 -On remdesivir    Length of Stay: [place], NP  04/30/2021, 10:48 AM  Advanced Heart Failure Team Pager [phone] (M-F; 7a - 5p)  Please contact [facility] for night-coverage after hours (5p -7a ) and weekends on [email or web address]  Patient seen and examined with the above-signed Advanced Practice Provider and/or Housestaff. I personally reviewed laboratory data, imaging studies and relevant notes. I independently examined the patient and formulated the important aspects of the plan. I have edited the note to reflect any of my changes or salient points. I have personally discussed the plan with the patient and/or family.  Diagnosed with COVID-19 infection over the weekend. Still with headache and mylagias but fevers resolving. On remdesivir,. Remains on heparin INR 1.3. VAD parameters stable. MAPS 80-90s on midodrine 2.5  General:  Sitting in chair NAD.  HEENT: normal  Neck: supple. JVP not elevated.  Carotids 2+ bilat; no bruits. No lymphadenopathy or thryomegaly appreciated. Cor: LVAD hum.  Lungs: Clear. Abdomen: obese soft, nontender, non-distended. No hepatosplenomegaly. No bruits or masses. Good bowel sounds. Driveline site clean. Anchor in place.  Extremities: no  cyanosis, clubbing, rash. Warm no 1+ edema  Neuro: alert & oriented x 3. No focal deficits. Moves all 4 without problem   Continue rx for COVID. Continue heparin for low INR. Warfarin dosing d/w PharmD. Continue IV abx. Likely can stop midodrine soon.   Glori Bickers, MD  10:55 PM

## 2021-04-30 NOTE — Progress Notes (Signed)
Physical Therapy Treatment Patient Details Name: Brandon [name] MRN: [id number] DOB: [birth year] Today's Date: 04/30/2021   History of Present Illness Pt is a 65 y.o. male admitted 03/07/21 with acute on chronic hypoxemic respiratory failure, cardiogenic shock. CT scan which shows bronchopneumonia and no indication of ILD. Prolonged admission undergoing workup for VAD. S/p LVAD placement 11/25. CXR 12/8 with significant bilateral pleural effusions. S/p L thoracentesis 12/8 and 12/12.  S/p R thoracentesis 12/9. Tested positive for COVID 12/18 after exposed by visitor. Other PMH includes CHF, ankylosing spondylitis, bacteremia and  possible endocarditis s/p ICD removal, HTN, depression, arthritis.    PT Comments    Pt with general malaise and only amb short distance in room. Reinforced importance of continuing to mobilize even while confined to room with covid.    Recommendations for follow up therapy are one component of a multi-disciplinary discharge planning process, led by the attending physician.  Recommendations may be updated based on patient status, additional functional criteria and insurance authorization.  Follow Up Recommendations  Home health PT     Assistance Recommended at Discharge Frequent or constant Supervision/Assistance  Equipment Recommendations  Rolling walker (2 wheels)    Recommendations for Other Services       Precautions / Restrictions Precautions Precautions: Fall;Sternal Precaution Comments: LVAD, COVID     Mobility  Bed Mobility Overal bed mobility: Modified Independent             General bed mobility comments: HOB up    Transfers Overall transfer level: Needs assistance Equipment used: Rolling walker (2 wheels) Transfers: Sit to/from Stand Sit to Stand: Min guard           General transfer comment: Pt placing hands at hips to stay inside "the tube" to rise. Assist for safety and lines. Limited to in the room due to Covid precautions     Ambulation/Gait Ambulation/Gait assistance: Min guard Gait Distance (Feet): 25 Feet Assistive device: Rolling walker (2 wheels) Gait Pattern/deviations: Step-through pattern;Trunk flexed Gait velocity: decr Gait velocity interpretation: <1.31 ft/sec, indicative of household ambulator   General Gait Details: Assist for Therapist, music    Modified Rankin (Stroke Patients Only)       Balance Overall balance assessment: Needs assistance Sitting-balance support: No upper extremity supported;Feet supported Sitting balance-Leahy Scale: Good     Standing balance support: No upper extremity supported;During functional activity Standing balance-Leahy Scale: Fair Standing balance comment: Static standing without UE support                            Cognition Arousal/Alertness: Awake/alert Behavior During Therapy: Flat affect Overall Cognitive Status: Impaired/Different from baseline Area of Impairment: Attention;Memory;Safety/judgement;Problem solving;Awareness                   Current Attention Level: Sustained Memory: Decreased short-term memory;Decreased recall of precautions   Safety/Judgement: Decreased awareness of safety Awareness: Emergent Problem Solving: Requires verbal cues;Difficulty sequencing General Comments: pt indecisive about whethe he wanted to remain up in chair for lunch        Exercises      General Comments        Pertinent Vitals/Pain Pain Assessment: Faces Faces Pain Scale: Hurts little more Pain Location: head Pain Descriptors / Indicators: Aching Pain Intervention(s): Limited activity within patient's tolerance    Home Living  Prior Function            PT Goals (current goals can now be found in the care plan section) Acute Rehab PT Goals Patient Stated Goal: return home Progress towards PT goals: Not progressing toward goals  - comment (fatigue and covid)    Frequency    Min 3X/week      PT Plan Current plan remains appropriate    Co-evaluation              AM-PAC PT "6 Clicks" Mobility   Outcome Measure  Help needed turning from your back to your side while in a flat bed without using bedrails?: None Help needed moving from lying on your back to sitting on the side of a flat bed without using bedrails?: None Help needed moving to and from a bed to a chair (including a wheelchair)?: A Little Help needed standing up from a chair using your arms (e.g., wheelchair or bedside chair)?: A Little Help needed to walk in hospital room?: A Little Help needed climbing 3-5 steps with a railing? : A Little 6 Click Score: 20    End of Session Equipment Utilized During Treatment: Oxygen Activity Tolerance: Patient limited by fatigue Patient left: in chair;with call bell/phone within reach   PT Visit Diagnosis: Difficulty in walking, not elsewhere classified (R26.2);Muscle weakness (generalized) (M62.81)     Time: 0177-9390 PT Time Calculation (min) (ACUTE ONLY): 24 min  Charges:  $Gait Training: 23-37 mins                     [place] Pager [phone] Office [place] 04/30/2021, 4:49 PM

## 2021-04-30 NOTE — Progress Notes (Signed)
Occupational Therapy Treatment Patient Details Name: Brandon [name] MRN: [id number] DOB: 09-18-1955 Today's Date: 04/30/2021   History of present illness Pt is a 65 y.o. male admitted 03/07/21 with acute on chronic hypoxemic respiratory failure, cardiogenic shock. CT scan which shows bronchopneumonia and no indication of ILD. Prolonged admission undergoing workup for VAD. S/p LVAD placement 11/25. CXR 12/8 with significant bilateral pleural effusions. S/p L thoracentesis 12/8 and 12/12.  S/p R thoracentesis 12/9. Tested positive for COVID 12/18 after exposed by visitor. Other PMH includes CHF, ankylosing spondylitis, bacteremia and  possible endocarditis s/p ICD removal, HTN, depression, arthritis.   OT comments  Pt c/o fatigue, body aches and chills. Participated in use of urinal standing bedside and seated grooming. Declined up to chair for lunch, wanting to stay in bed rather than sitting near window.    Recommendations for follow up therapy are one component of a multi-disciplinary discharge planning process, led by the attending physician.  Recommendations may be updated based on patient status, additional functional criteria and insurance authorization.    Follow Up Recommendations  No OT follow up    Assistance Recommended at Discharge Intermittent Supervision/Assistance  Equipment Recommendations  BSC/3in1;Tub/shower seat    Recommendations for Other Services      Precautions / Restrictions Precautions Precautions: Fall;Sternal Precaution Comments: LVAD, COVID       Mobility Bed Mobility Overal bed mobility: Modified Independent             General bed mobility comments: HOB up    Transfers Overall transfer level: Needs assistance Equipment used: Rolling walker (2 wheels) Transfers: Sit to/from Stand Sit to Stand: Min guard                 Balance Overall balance assessment: Needs assistance   Sitting balance-Leahy Scale: Good     Standing balance  support: No upper extremity supported;During functional activity Standing balance-Leahy Scale: Fair                             ADL either performed or assessed with clinical judgement   ADL Overall ADL's : Needs assistance/impaired     Grooming: Wash/dry hands;Wash/dry face;Sitting;Set up                   Toilet Transfer: Min guard;Rolling walker (2 wheels) Toilet Transfer Details (indicate cue type and reason): stood to use urinal Toileting- Clothing Manipulation and Hygiene: Sit to/from stand;Min guard              Extremity/Trunk Assessment              Vision       Perception     Praxis      Cognition Arousal/Alertness: Awake/alert Behavior During Therapy: Flat affect Overall Cognitive Status: Impaired/Different from baseline Area of Impairment: Attention;Memory;Safety/judgement;Problem solving;Awareness                   Current Attention Level: Sustained Memory: Decreased short-term memory;Decreased recall of precautions   Safety/Judgement: Decreased awareness of safety Awareness: Emergent Problem Solving: Requires verbal cues;Difficulty sequencing General Comments: pt indecisive about whethe he wanted to remain up in chair for lunch          Exercises     Shoulder Instructions       General Comments      Pertinent Vitals/ Pain       Pain Assessment: Faces Faces Pain Scale: Hurts little more Pain Location:  generalized Pain Descriptors / Indicators: Aching Pain Intervention(s): Repositioned  Home Living                                          Prior Functioning/Environment              Frequency  Min 2X/week        Progress Toward Goals  OT Goals(current goals can now be found in the care plan section)  Progress towards OT goals: Not progressing toward goals - comment (pt more fatigued with body aches today)  Acute Rehab OT Goals OT Goal Formulation: With patient Time For  Goal Achievement: 05/07/21 Potential to Achieve Goals: Good  Plan Discharge plan remains appropriate    Co-evaluation                 AM-PAC OT "6 Clicks" Daily Activity     Outcome Measure   Help from another person eating meals?: None Help from another person taking care of personal grooming?: A Little Help from another person toileting, which includes using toliet, bedpan, or urinal?: A Little Help from another person bathing (including washing, rinsing, drying)?: A Little Help from another person to put on and taking off regular upper body clothing?: A Little Help from another person to put on and taking off regular lower body clothing?: A Little 6 Click Score: 19    End of Session    OT Visit Diagnosis: Unsteadiness on feet (R26.81);Other abnormalities of gait and mobility (R26.89);Muscle weakness (generalized) (M62.81)   Activity Tolerance Patient limited by fatigue   Patient Left in bed;with call bell/phone within reach   Nurse Communication          Time: 1145-1200 OT Time Calculation (min): 15 min  Charges: OT General Charges $OT Visit: 1 Visit OT Treatments $Self Care/Home Management : 8-22 mins  Nestor Lewandowsky, OTR/L Acute Rehabilitation Services Pager: [phone] Office: [phone]   Malka So 04/30/2021, 1:07 PM

## 2021-04-30 NOTE — Progress Notes (Signed)
[place] for warfarin + heparin Indication:  s/p VAD (HM3) , afib, hx DVT  No Active Allergies  Patient Measurements: Height: 6' (182.9 cm) Weight: 76 kg (167 lb 8.8 oz) IBW/kg (Calculated) : 77.6   Vital Signs: Temp: 97.8 F (36.6 C) (12/19 0913) Temp Source: Oral (12/19 0913) BP: 100/91 (12/19 0900) Pulse Rate: 74 (12/19 0913)  Labs: Recent Labs    04/27/21 1811 04/28/21 0433 04/29/21 0036 04/30/21 0510  HGB  --  9.5*  --  9.3*  HCT  --  30.9*  --  31.0*  PLT  --  358  --  300  LABPROT  --  17.8* 16.8* 16.4*  INR  --  1.5* 1.4* 1.3*  HEPARINUNFRC  --  <0.10* <0.10* <0.10*  CREATININE  --  0.83 0.75  --   TROPONINIHS 253*  --   --   --      Estimated Creatinine Clearance: 96.3 mL/min (by C-G formula based on SCr of 0.75 mg/dL).   Medical History: Past Medical History:  Diagnosis Date   AICD (automatic cardioverter/defibrillator) present 08/31/2019   Ankylosing spondylitis ([place])    Arthritis    BENIGN PROSTATIC HYPERTROPHY 06/07/2008   CHF (congestive heart failure) ([place])    COLONIC POLYPS, HX OF 06/07/2008   Depression    H/O hiatal hernia    Heart failure ([place])    HYPERLIPIDEMIA 06/07/2008   HYPERTENSION 06/07/2008   Myocardial infarction [facility]) 2005   NSTEMI, s/p LAD stent   NEPHROLITHIASIS, HX OF 06/07/2008   STEMI (ST elevation myocardial infarction) ([place]) 04/27/2019    Assessment: 65 yo M s/p HM 3 and AVR on 04/06/21. Prior to surgery/admit patient was on apixiban for a DVT.  Pt s/p thora 12/8, 12/9, and 12/12. INR subtherapeutic after lower doses for that procedure. Started fixed heparin dose at 400 uts/hr started on 12/12 with heparin level daily.  INR this morning  1.3 remains subtherapeutic, despite boosted doses.  Patient was sensitive o warfarin post op and previously therapeutic on warfarin 2.5mg  daily   Heparin level undectable at <0.1 as expected on 400 units/hr. No issues with bleeding per RN.  Goal  of Therapy:  Warfarin INR 2.0-2.5 Heparin Level <0.3 Monitor platelets by anticoagulation protocol: Yes   Plan:  Warfarin 7.5 mg PO x1 tonight  Heparin 400 units/h - no titrations Daily INR, heparin level, CBC, LDH ASA until 12/25    Bonnita Nasuti Pharm.D. CPP, BCPS Clinical Pharmacist [phone] 04/30/2021 12:18 PM   Please check [email or web address] for unit-specific pharmacy phone numbers.

## 2021-05-01 LAB — GLUCOSE, CAPILLARY
Glucose-Capillary: 83 mg/dL (ref 70–99)
Glucose-Capillary: 106 mg/dL — ABNORMAL HIGH (ref 70–99)
Glucose-Capillary: 64 mg/dL — ABNORMAL LOW (ref 70–99)
Glucose-Capillary: 151 mg/dL — ABNORMAL HIGH (ref 70–99)
Glucose-Capillary: 142 mg/dL — ABNORMAL HIGH (ref 70–99)

## 2021-05-01 LAB — FERRITIN: Ferritin: 1640 ng/mL — ABNORMAL HIGH (ref 24–336)

## 2021-05-01 LAB — CBC
HCT: 31.5 % — ABNORMAL LOW (ref 39.0–52.0)
Hemoglobin: 9.6 g/dL — ABNORMAL LOW (ref 13.0–17.0)
MCH: 24 pg — ABNORMAL LOW (ref 26.0–34.0)
MCHC: 30.5 g/dL (ref 30.0–36.0)
MCV: 78.8 fL — ABNORMAL LOW (ref 80.0–100.0)
Platelets: 308 10*3/uL (ref 150–400)
RBC: 4 MIL/uL — ABNORMAL LOW (ref 4.22–5.81)
RDW: 19.6 % — ABNORMAL HIGH (ref 11.5–15.5)
WBC: 5.5 10*3/uL (ref 4.0–10.5)
nRBC: 0 % (ref 0.0–0.2)

## 2021-05-01 LAB — D-DIMER, QUANTITATIVE: D-Dimer, Quant: 6.04 ug/mL-FEU — ABNORMAL HIGH (ref 0.00–0.50)

## 2021-05-01 LAB — HEPARIN LEVEL (UNFRACTIONATED): Heparin Unfractionated: <0.1 IU/mL — ABNORMAL LOW (ref 0.30–0.70)

## 2021-05-01 LAB — BASIC METABOLIC PANEL
Anion gap: 10 (ref 5–15)
BUN: 14 mg/dL (ref 8–23)
CO2: 30 mmol/L (ref 22–32)
Calcium: 7.9 mg/dL — ABNORMAL LOW (ref 8.9–10.3)
Chloride: 90 mmol/L — ABNORMAL LOW (ref 98–111)
Creatinine, Ser: 0.71 mg/dL (ref 0.61–1.24)
GFR, Estimated: >60 mL/min (ref >60)
Glucose, Bld: 96 mg/dL (ref 70–99)
Potassium: 3.2 mmol/L — ABNORMAL LOW (ref 3.5–5.1)
Sodium: 130 mmol/L — ABNORMAL LOW (ref 135–145)

## 2021-05-01 LAB — PROTIME-INR
INR: 1.4 — ABNORMAL HIGH (ref 0.8–1.2)
Prothrombin Time: 17.4 seconds — ABNORMAL HIGH (ref 11.4–15.2)

## 2021-05-01 LAB — PHOSPHORUS: Phosphorus: 3.9 mg/dL (ref 2.5–4.6)

## 2021-05-01 LAB — MAGNESIUM: Magnesium: 1.9 mg/dL (ref 1.7–2.4)

## 2021-05-01 LAB — C-REACTIVE PROTEIN: CRP: 1.9 mg/dL — ABNORMAL HIGH (ref <1.0)

## 2021-05-01 LAB — LACTATE DEHYDROGENASE: LDH: 174 U/L (ref 98–192)

## 2021-05-01 MED ORDER — POTASSIUM CHLORIDE CRYS ER 20 MEQ PO TBCR
60.0000 meq | EXTENDED_RELEASE_TABLET | Freq: Three times a day (TID) | ORAL | Status: AC
  Administered 2021-05-01 (×3): 60 meq via ORAL
  Filled 2021-05-01 (×3): qty 3

## 2021-05-01 MED ORDER — MAGNESIUM SULFATE 2 GM/50ML IV SOLN
2.0000 g | Freq: Once | INTRAVENOUS | Status: AC
  Administered 2021-05-01: 10:00:00 2 g via INTRAVENOUS
  Filled 2021-05-01: qty 50

## 2021-05-01 MED ORDER — WARFARIN SODIUM 7.5 MG PO TABS
7.5000 mg | ORAL_TABLET | Freq: Once | ORAL | Status: AC
  Administered 2021-05-01: 17:00:00 7.5 mg via ORAL
  Filled 2021-05-01: qty 1

## 2021-05-01 NOTE — Progress Notes (Signed)
LVAD Coordinator Rounding Note:  Admitted 03/07/21 due to sepsis. Dr. [name] consulted as his HF Cardiologist.   HM III LVAD implanted on 04/06/21 by Dr. Cyndia Bent under Destination Therapy criteria. Not a transplant candidate at this time due to recurrent infections.   Pt sitting up in chair at side of bed, sleeping.  Covid restrictions in place.   Vital signs: Temp: 98 HR: 72 Doppler:  88 Automatic BP:  102/91 (97) O2 Sat: 96% on RA Wt:171.5>190>187.4>187>...177.6>176.15>174.8>171.3>174.3>172.4>175.4>175>174>167>166 lbs     LVAD interrogation reveals:  Speed: 5800 Flow: 5.1 Power: 4.9w PI: 3.2  Alarms: none Events:  Hematocrit: 30  Fixed speed: 5800 Low speed limit: 5500  Drive Line:  Existing VAD dressing C/D/I with anchor intact and accurately applied. Continue twice a week dressing changes on Monday and Thursday per VAD Coordinator, Nurse Davonna Belling, or Tomi Bamberger. Next dressing change due 05/03/21.    Labs:  LDH trend: 309>248>215>206>201........193>160>141>115>127>135>152>158>158>174  INR trend: 1.6>1.3>1.3>1.4>1.5>1.6>1.8>2.0>2.0>2.0>1.8>1.7>1.4>1.5>1.3>1.4>1.5>1.4>1.6>1.4   Anticoagulation Plan: -INR Goal: 2 - 2.5 -ASA Dose: 81 mg daily  Blood Products:   Intra-op: - 04/06/21>>4 PRBCs, 4 FFP, 1263 cc cell saver  Post-op: - 04/06/21>>1 unit Plts - 04/07/21>>1 unit PRBC  Device: N/A  Arrythmias: 03/22/21 - developed Afib w/ RVR, converted with IV amiodarone  Respiratory: extubated 04/07/21  Nitric Oxide: off 04/07/21  Gtts: Heparin 400 u/hr  Infection: - 04/11/21 Blood cultures x 2>> no growth x 5 days; final - 04/19/21 Body fluid cx (left lung)>> no growth <24 hours - 04/19/21 Gram stain (left lung)>> no WBC or organisms seen; final - 04/20/21 Gram stain (right lung)>> rare WBC, no organisms seen; final - 04/27/21 blood culture x 2>> pending - 04/27/21 Sputum>> negative - 04/27/21 Urine>>negative - 04/28/21 Covid>>positive   Patient/Family  Teaching:  VAD discharge teaching completed with patient, his sister [name], and his friend Tomi Bamberger. See separate note for documentation.  Tomi Bamberger able to do dressing changes with bedside nurse.  Plan/Recommendations:  1. Call VAD Coordinator for any VAD equipment or drive line issues. 2.  Twice a week dressing changes per VAD Coordinator, Nurse Davonna Belling, or trained caregiver. 3. VAD discharge teaching completed with patient and family.    Zada Girt RN [place] Coordinator  Office: [phone]  24/7 Pager: [phone]

## 2021-05-01 NOTE — Progress Notes (Addendum)
Patient ID: Brandon [name], male   DOB: Jan 04, 1956, 65 y.o.   MRN: [id number]      Advanced Heart Failure Rounding Note  PCP-Cardiologist: [name], MD  [facility]: Dr. [name]   Subjective:    Admitted with sepsis. Started on cefepime + vanc, now off abx (ID following). Afebrile.  10/29 Co-ox 48%, started on milrinone 0.25.  10/30 IV Antibiotics stopped. Blood cultures no growth for 5 days.  10/31 started on Augmentin for PNA.  11/1 Milrinone off 11/2 Milrinone added back. Midodrine increased to 10 mg TID 11/10 Developed Afib w/ RVR, converted with IV amio 11/14 Milrinone increased to 0.25 mcg. Diuresed with IV lasix  11/25 Underwent HM-3 VAD placement and bioprosthetic AVR. Had persistent hypotension in OR requiring methylene blue and high dose pressors.  11/26 Extubated 11/28 Started on lasix gtt>>later stopped due to low CVP (1) and increasing pressor requirements. Restarted on scheduled BID IV Lasix once pressure stabilized (still volume up)  11/28 Surgical path with evidence of possible "acute AoV endocarditis". ID consulted. Bcx recollected. Tissue sent to [facility] in [place].   11/30 Started on IV ceftriaxone to cover the Streptococcus gordonae that was isolated before, per ID. 11/30 VAD speed increased to 5600 12/1 Diuresed with IV lasix + metolazone. Milrinone was stopped. Amio drip transitioned to amio 200 mg twice a day.  12/5 Ramp Echo speed increased to 5800. Epi stopped.  12/6 Transferred to [place]. Had BM. Started on spiro 12.5 mg daily 12/8 CXR with significant bilateral pleural effusions. S/p Left Thoracentesis w/ 1.7 L out but still with residual fluid 12/9 Rt Thoracentesis  12/11 Given dose of tolvaptan and diuresed with IV lasix. Negative 2.5 liters.  12/12 Repeat Lt Thoracentesis w/ 1L Fluid removal  12/18 + SARS2  LDH stable. INR still climbing slowly, 1.4 today. Remains on heparin gtt.  Feeling better today. No complaints. Denies dyspnea or cough. No recurrent chills.    LVAD Interrogation HM 3: Speed: 5800 Flow: 5.4 PI: 3.2  Power: 5 Multiple PI events Parameters stable.    Objective:   Weight Range: 75.3 kg Body mass index is 22.51 kg/m.   Vital Signs:   Temp:  [97.5 F (36.4 C)-97.8 F (36.6 C)] 97.7 F (36.5 C) (12/20 0500) Pulse Rate:  [72-79] 72 (12/20 0500) Resp:  [18-20] 18 (12/20 0500) BP: (88-108)/(59-91) 97/84 (12/20 0500) SpO2:  [94 %-98 %] 96 % (12/20 0500) Weight:  [75.3 kg] 75.3 kg (12/20 0500) Last BM Date: 04/30/21  Weight change: Filed Weights   04/29/21 0410 04/30/21 0345 05/01/21 0500  Weight: 79.1 kg 76 kg 75.3 kg    Intake/Output:   Intake/Output Summary (Last 24 hours) at 05/01/2021 0857 Last data filed at 05/01/2021 0700 Gross per 24 hour  Intake 1663.97 ml  Output 2625 ml  Net -961.03 ml     Physical Exam   MAPs 80s-90s Physical Exam: GENERAL: Sitting up in chair. Comfortable appearing. HEENT: normal  NECK: Supple, No JVD.  2+ bilaterally, no bruits.  No lymphadenopathy or thyromegaly appreciated.   CARDIAC:  Mechanical heart sounds with LVAD hum present.  LUNGS:  Clear to auscultation bilaterally.  ABDOMEN:  Soft, round, nontender, positive bowel sounds x4.     LVAD exit site: well-healed and incorporated.  Dressing dry and intact.  No erythema or drainage.  Stabilization device present and accurately applied.  Driveline dressing is being changed daily per sterile technique. EXTREMITIES:  Warm and dry, no cyanosis, clubbing, rash, compression stockings on NEUROLOGIC:  Alert  and oriented x 4.  Gait steady.  No aphasia.  No dysarthria.  Affect pleasant.         Telemetry   NSR 70s-80s, rare PVCs  Labs    CBC Recent Labs    04/30/21 0510 05/01/21 0111  WBC 5.3 5.5  HGB 9.3* 9.6*  HCT 31.0* 31.5*  MCV 79.7* 78.8*  PLT 300 413   Basic Metabolic Panel Recent Labs    04/30/21 0510 05/01/21 0111  NA 132* 130*  K 3.5 3.2*  CL 91* 90*  CO2 33* 30  GLUCOSE 93 96  BUN 11 14   CREATININE 0.95 0.71  CALCIUM 8.3* 7.9*  MG 1.5* 1.9  PHOS 4.0 3.9   Liver Function Tests No results for input(s): AST, ALT, ALKPHOS, BILITOT, PROT, ALBUMIN in the last 72 hours.    No results for input(s): LIPASE, AMYLASE in the last 72 hours. Cardiac Enzymes No results for input(s): CKTOTAL, CKMB, CKMBINDEX, TROPONINI in the last 72 hours.  BNP: BNP (last 3 results) Recent Labs    04/13/21 0333 04/20/21 0600 04/26/21 0420  BNP 438.2* 193.0* 273.9*    ProBNP (last 3 results) No results for input(s): PROBNP in the last 8760 hours.    D-Dimer Recent Labs    04/30/21 0510 05/01/21 0111  DDIMER 6.83* 6.04*   Hemoglobin A1C No results for input(s): HGBA1C in the last 72 hours.  Fasting Lipid Panel No results for input(s): CHOL, HDL, LDLCALC, TRIG, CHOLHDL, LDLDIRECT in the last 72 hours. Thyroid Function Tests No results for input(s): TSH, T4TOTAL, T3FREE, THYROIDAB in the last 72 hours.  Invalid input(s): FREET3  Other results:   Imaging    No results found.   Medications:     Scheduled Medications:  (feeding supplement) PROSource Plus  30 mL Oral TID PC & HS   sodium chloride   Intravenous Once   amiodarone  200 mg Oral Daily   vitamin C  500 mg Oral Daily   aspirin EC  81 mg Oral Daily   atorvastatin  80 mg Oral Daily   digoxin  0.125 mg Oral Daily   docusate sodium  200 mg Oral Daily   doxycycline  100 mg Oral Q12H   eplerenone  25 mg Oral Daily   ezetimibe  10 mg Oral Daily   feeding supplement  237 mL Oral TID BM   folic acid  1 mg Oral Daily   gabapentin  300 mg Oral TID   Gerhardt's butt cream   Topical Daily   insulin aspart  0-15 Units Subcutaneous TID WC   insulin detemir  10 Units Subcutaneous Daily   mouth rinse  15 mL Mouth Rinse BID   melatonin  5 mg Oral QHS   midodrine  2.5 mg Oral TID WC   multivitamin with minerals  1 tablet Oral Q lunch   pantoprazole  40 mg Oral Daily   polyethylene glycol  17 g Oral Daily    potassium chloride  60 mEq Oral TID   sertraline  100 mg Oral Daily   tamsulosin  0.4 mg Oral Daily   thiamine  100 mg Oral Daily   torsemide  40 mg Oral BID   vitamin B-12  100 mcg Oral Daily   Warfarin - Pharmacist Dosing Inpatient   Does not apply q1600   zinc sulfate  220 mg Oral Daily    Infusions:  sodium chloride Stopped (04/14/21 1149)   cefTRIAXone (ROCEPHIN)  IV 2 g (04/30/21 0859)  heparin 400 Units/hr (05/01/21 0700)   lactated ringers Stopped (04/13/21 1053)   magnesium sulfate bolus IVPB     remdesivir 100 mg in NS 100 mL Stopped (04/30/21 1340)     PRN Medications: sodium chloride, acetaminophen, bisacodyl **OR** bisacodyl, lidocaine, morphine injection, ondansetron (ZOFRAN) IV, oxyCODONE, polyvinyl alcohol, sodium chloride flush, traMADol    Patient Profile   [name] is a 65 year old with history of smoker, CAD s/p previous MI, HTN, HL, chronic HFrEF, sepsis 11/2020 bacteremia.  Admitted with sepsis. Bld Cx - NGTD    Assessment/Plan   1. Acute on chronic systolic HFr EF -> cardiogenic shock - Echo 05/19/20 EF 20-25% RV mildly HK. moderate AS  Mean gradient 13 AVA 1.2 cm2 DI 0.30 - Persistent NYHA IV. Admitted with shock - s/p HM-III VAD + bioprosthetic AVR on 11/25 - RV looked good on TEE in OR - Continue digoxin 0.125.  -  MAPs have been 80s-90s . Will stop midodrine. - Continue eplerenone 25 daily - Volume looks okay. Continue Torsemide 40 mg BID - Renal function stable.   2. HM-3 LVAD implant - Plan as above - VAD interrogated personally. Parameters stable. - INR 1.4   Continue ASA 81 for 30 days. Stop ASA 05/14/21  - Continue heparin gtt until INR 1.8 (need to increase warfarin).  - LDH stable  - Had ramp ECHO speed increased to 5800. Rare PI events.   3. Acute Hypoxic Respiratory Failure  - Extubated 11/26, stable on 2L [place].    4. Severe low-flow aortic stenosis s/p AVR - not candidate for TAVR due to presence of fibroelastoma - s/p  VAD/bioprosthetic AVR on 11/25   5. CAD  s/p anterior STEMI (12/20). LHC showed chronically occluded RCA (with L>>R collaterals) and thrombotic occlusion of proximal LAD. Underwent PCI of LAD. - No s/s angina - Continue atorvastatin 80.   6. Iron-deficiency anemia & ABLA - Rec'd Feraheme 11/8 and 11/13  - Rec'd 1 unit PRBCs on 12/10 - Keep hgb > 8.0,  9.6 today - 04/23/21 Iron sats low. Given feraheme  7. Hypokalemia/ Hypomagnesemia  - Keep K > 4.0 Mg > 2.0  - Replacing K and Mag.   8. Severe protein-calorie malnutrition - prealbumin 11>15> 13 >6.6 - Continue aggressive nutritional support  9. Paroxysmal Atrial Fibrillation w/ RVR - new, developed 11/10. Maintaining SR/ST    - Off amio drip. Continue amio 200 mg once daily  - on warfarin/heparin. INR 1.4 today.   10. Hyponatremia - Na 130 - tolvaptan on 12/11.  - monitor   11. Sepsis - H/o strep bacteremia 11/2020.  ICD extracted 12/04/20. Barostim was not removed as it is extravascular.  - Received IV antibiotics until 01/15/21. Blood cultures negative 01/30/21..  - 10/26 -Blood CX x2  Negative  - Ct chest concerning for pneumonia. 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   PCT < 0.1, WBCs 7, afebrile.  Cultures NGTD,  vancomycin stopped.   - 12/18 + SARS-CoV-2. See below.   12. Hemorrhagic Pericarditis - Fibrin clot encasing heart at time of surgery.  Will need to be careful with anticoagulation (go slowly) - PharmD managing.   57. 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  - Bcx recollected and pending  - Tissue sent to [facility] in [place] for broad 16 S ribosomal  sequencing for bacterial pathogens as well as T wet Bhilai Bartonella Brucella  - ID following, recommending  IV ceftriaxone to cover the Streptococcus gordonae that was isolated before given possible acute AoV endocarditis on surgical path. - Ceftriaxone 2 g IV daily x 6 weeks. End Date: May 22, 2021 - Home IV abx management has been arranged with [facility] and [place] out can replace prior to D/C. ? Stable enough to replace today.  14. Constipation - Resolved.    15. Bilateral pleural effusions - S/p Left thoracentesis 12/8 but still with residual fluid. Will likely need repeat L tap in a few days - R thoracentesis 12/9.  - S/p repeat left thoracentesis 12/12 w/ 1L Fluid removal  - Repeat CXR 12/15 w/ small b/l effusions.  - Lungs sound ok today.   16. Left Shoulder Pain - post thoracentesis. Post procedure CXR negative for PTX  - WBC 5.3  - suspect musculoskeletal etiology. Normal AROM  - low suspicion for gout - much improved, continue supportive care - PRN tylenol, heating pad    17. SARS-CoV-2 -On remdesivir, last dose 12/22   Disposition:  -Will need HH PT at discharge -Home IV abx have been arranged with [facility] and [place]   Length of Stay: [address] [place], PA-C  05/01/2021, 8:57 AM  Advanced Heart Failure Team Pager [phone] (M-F; 7a - 5p)  Please contact [facility] for night-coverage after hours (5p -7a ) and weekends on [email or web address]  Patient seen and examined with the above-signed Advanced Practice Provider and/or Housestaff. I personally reviewed laboratory data, imaging studies and relevant notes. I independently examined the patient and formulated the important aspects of the plan. I have edited the note to reflect any of my changes or salient points. I have personally discussed the plan with the patient and/or family.   Getting remdesivir for [place]. Feeling a bit better. Still achy. HA resolved. Volume stable on  torsemide 40 bid. On heparin/warfarin INR up to 1.4.  General:  NAD.  HEENT: normal  Neck: supple. JVP not elevated.  Carotids 2+ bilat; no bruits. No lymphadenopathy or thryomegaly appreciated. Cor: LVAD hum.  Lungs: Clear. Abdomen:  soft, nontender, non-distended. No hepatosplenomegaly. No bruits or masses. Good bowel sounds. Driveline site clean. Anchor in place.  Extremities: no cyanosis, clubbing, rash. Warm tr-1+ edema  Neuro: alert & oriented x 3. No focal deficits. Moves all 4 without problem   Improving with rx for COVID. Continue heparin until INR >= 1.8. VAD interrogated personally. Parameters stable.  Be careful not to overdiurese.   Glori Bickers, MD  8:11 PM

## 2021-05-01 NOTE — Discharge Summary (Addendum)
Advanced Heart Failure Team  Discharge Summary   Patient ID: Brandon [name] MRN: [id number], DOB/AGE: 06-11-1955 65 y.o. Admit date: 03/07/2021 D/C date:     05/09/2021   Primary Discharge Diagnoses:  Acute on chronic systolic HF Cardiogenic shock HM III LVAD  Acute hypoxic respiratory failure Severe low-flow aortic valve stenosis s/p AVR CAD Iron deficiency anemia Hypokalemia Hypomagnesemia Severe protein-calorie malnutrition Paroxysmal atrial fibrillation Hyponatremia Sepsis Hemorrhagic pericarditis Aortic valve endocarditis Bilateral pleural effusions requiring thoracentesis  SARS CoV 2   Hospital Course:   Brandon [name] is a 65 year old with history of tobacco use, CAD s/p previous MI, HTN, HL, chronic HFeEF, sepsis/bacteremia 11/2020.   He was admitted 04/27/19 with anterior ST elevation MI. Cath with chronically occluded RCA (L>>R collaterals) and thrombotic occlusion of proximal LAD treated with PCI. Echo w/ reduced LVEF 30-35% w/ apical aneurysm. RV ok. Post cath, he required milrinone for cardiogenic shock.   Now s/p Barostim.   Echo 6/22 F 20-25% RV mildly HK. Moderate to severe low-flow AS DI 0.34. Referred for TAVR evaluation.    Underwent outpatient [facility] 11/29/20 as part of VAD w/u. He had low filling pressures and CI marginal at 2.3. Post procedure, developed severe rigors, n/v and fever. He was admitted to ICU for suspected sepsis where he then developed acute hypoxic respiratory failure, requiring intubation. Blood Cx + strep. 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  Likely low-flow low gradient AS. Mean gradient 18, RV mild HK. He required inotropic support and eventually  weaned off. Hospitalization also c/b RUE DVT and aspiration pneumonia. He underwent ICD extraction 12/04/20, but Barostim was left as it is extravascular.    Completed IV antibiotics on 01/16/21. Saw ID and had negative repeat blood cultures on  01/30/21.   Saw Bartle on 02/23/21. Plan for transplant evaluation at [facility] and then to decide timing of LVAD.   Admitted on 03/07/21 with sepsis and initiated on IV abx. Blood cultures remained negative. Abx stopped after no growth. Also had acute respiratory failure secondary to combination of HF and later pneumonia treated with abx.    Developed worsening HF and started on IV diuretics. PICC line placed to follow CVP and CO-OX. CO-OX low and required inotrope support with milrinone. Failed milrinone wean. Discussed with [name] and not felt to be a transplant candidate. After workup, deemed to be a candidate for LVAD. He underwent placement of HM3 VAD and aortic valve replacement with 25 mm Edwards Inspiris Resilia valve on 04/06/21.  Hemorrhagic pericarditis with fibrin clot encasing heart at time of surgery. VAD speed increased to 5800 after ramp echo on 12/05. He was extubated on 11/26. Inotrope and pressors weaned off over period 9 days.   Surgical path came back 11/28 with evidence of possible "acute aortic valve endocarditis".  Bcx recollected. Tissue sent to [facility] in [place]. ID consulted. Started on IV ceftriaxone to cover Streptococcus gordonae that was isolated previously. Needs 6 weeks IV abx (end date 05/22/21) which was arranged with [facility].  Diuresed with IV lasix + metolazone until euvolemic for volume overload post-op. Later switched to po Torsemide.   Course complicated by AF with RVR converted to NSR with IV amio, hyponatremia for which he required tolvaptan, iron deficiency anemia s/p 1 U PRBCs.  Developed bilateral pleural effusions s/p L thoracentesis on 12/08 and R thoracentesis 12/09.  Repeat L thoracentesis on 12/12.  Developed rigors on 12/16. AF. No leukocytosis. Vancomycin added.  BC with NG. Tested positive for SARS-CoV-2 on 12/18. Treated with 4 days of Remdesivir. Recovered.  On 12/28, he was last seen and examined by Dr. Aundra Dubin and felt stable for discharge  home. INR 2.0 day of discharge. Will discharge on warfarin 2.5 mg daily except 5 mg on Tuesday/Saturday.   Family/ caregivers received appropriate LVAD training.   VAD clinic and INR follow-ups arranged.    LVAD Interrogation HM 3: Speed: 5800 Flow: 4.2 PI: 7.2 Power: 4.5. multiple PI events VAD interrogated personally. Parameters stable.   Discharge Weight Range: 165 lb  Discharge Vitals: Blood pressure 104/84, pulse 86, temperature 97.6 F (36.4 C), temperature source Oral, resp. rate 14, height 6' (1.829 m), weight 75.1 kg, SpO2 98 %.  Labs: Lab Results  Component Value Date   WBC 5.6 05/09/2021   HGB 9.2 (L) 05/09/2021   HCT 30.1 (L) 05/09/2021   MCV 80.9 05/09/2021   PLT 207 05/09/2021    Recent Labs  Lab 05/09/21 0525  NA 136  K 3.6  CL 100  CO2 29  BUN 12  CREATININE 0.56*  CALCIUM 8.3*  GLUCOSE 90   Lab Results  Component Value Date   CHOL 122 02/06/2021   HDL 37 (L) 02/06/2021   LDLCALC 60 02/06/2021   TRIG 124 02/06/2021   BNP (last 3 results) Recent Labs    04/26/21 0420 05/04/21 0410 05/08/21 0345  BNP 273.9* 156.7* 349.4*    ProBNP (last 3 results) No results for input(s): PROBNP in the last 8760 hours.   Diagnostic Studies/Procedures   No results found.  Discharge Medications   Allergies as of 05/09/2021   No Active Allergies      Medication List     STOP taking these medications    apixaban 5 MG Tabs tablet Commonly known as: ELIQUIS   dapagliflozin propanediol 10 MG Tabs tablet Commonly known as: Farxiga   furosemide 40 MG tablet Commonly known as: LASIX   ivabradine 7.5 MG Tabs tablet Commonly known as: CORLANOR       TAKE these medications    acetaminophen 500 MG tablet Commonly known as: TYLENOL Take 1,000 mg by mouth every 6 (six) hours as needed (pain.).   ARTIFICIAL TEARS OP Place 1 drop into both eyes 3 (three) times daily as needed (for dryness or irritation).   atorvastatin 80 MG tablet Commonly  known as: LIPITOR TAKE 1 TABLET BY MOUTH ONCE DAILY AT 6PM. What changed: See the new instructions.   cefTRIAXone  IVPB Commonly known as: ROCEPHIN Inject 2 g into the vein daily. Indication:  Possible endocarditis First Dose: Yes Last Day of Therapy:  05/22/21 Labs - Once weekly:  CBC/D and BMP, Labs - Every other week:  ESR and CRP Method of administration: IV Push Method of administration may be changed at the discretion of home infusion pharmacist based upon assessment of the patient and/or caregiver's ability to self-administer the medication ordered.   digoxin 0.125 MG tablet Commonly known as: LANOXIN Take 1 tablet (0.125 mg total) by mouth daily.   diphenhydrAMINE 25 MG tablet Commonly known as: BENADRYL Take 50 mg by mouth at bedtime.   doxycycline 100 MG capsule Commonly known as: Vibramycin Take 1 capsule (100 mg total) by mouth 2 (two) times daily. Stop date 05/22/21   eplerenone 50 MG tablet Commonly known as: INSPRA Take 0.5 tablets (25 mg total) by mouth in the morning.   ezetimibe 10 MG tablet Commonly known as: ZETIA TAKE ONE TABLET BY MOUTH  DAILY   gabapentin 300 MG capsule Commonly known as: NEURONTIN Take 1 capsule (300 mg total) by mouth 3 (three) times daily. What changed:  medication strength how much to take when to take this   losartan 25 MG tablet Commonly known as: COZAAR Take 1/2 tablet (12.5 mg total) by mouth daily.   melatonin 5 MG Tabs Take 1 tablet (5 mg total) by mouth at bedtime.   pantoprazole 40 MG tablet Commonly known as: PROTONIX Take 1 tablet (40 mg total) by mouth daily.   potassium chloride SA 20 MEQ tablet Commonly known as: KLOR-CON M Take 1 tablet (20 mEq total) by mouth daily.   sertraline 100 MG tablet Commonly known as: ZOLOFT Take 1 tablet (100 mg total) by mouth daily. What changed:  medication strength how much to take   tamsulosin 0.4 MG Caps capsule Commonly known as: FLOMAX TAKE 1 CAPSULE(0.4 MG) BY  MOUTH DAILY What changed: See the new instructions.   torsemide 20 MG tablet Commonly known as: DEMADEX Take 1 tablet (20 mg total) by mouth daily.   traMADol 50 MG tablet Commonly known as: ULTRAM Take 1 tablet (50 mg total) by mouth every 6 (six) hours as needed for moderate pain.   warfarin 2.5 MG tablet Commonly known as: Coumadin Take 1 tablet (2.5 mg) daily in the evening except 2 tablets (5 mg) on Tuesday/Saturday               Durable Medical Equipment  (From admission, onward)           Start     Ordered   04/17/21 1251  Heart failure home health orders  (Heart failure home health orders / Face to face)  Once       Comments: Heart Failure Follow-up Care:  Verify follow-up appointments per Patient Discharge Instructions. Confirm transportation arranged. Reconcile home medications with discharge medication list. Remove discontinued medications from use. Assist patient/caregiver to manage medications using pill box. Reinforce low sodium food selection Assessments: Vital signs and oxygen saturation at each visit. Assess home environment for safety concerns, caregiver support and availability of low-sodium foods. Consult Education officer, museum, PT/OT, Dietitian, and CNA based on assessments. Perform comprehensive cardiopulmonary assessment. Notify MD for any change in condition or weight gain of 3 pounds in one day or 5 pounds in one week with symptoms. Daily Weights and Symptom Monitoring:  HHPT Home antibiotics.  Ensure patient has access to scales. Teach patient/caregiver to weigh daily before breakfast and after voiding using same scale and record.    Teach patient/caregiver to track weight and symptoms and when to notify Provider. Activity: Develop individualized activity plan with patient/caregiver.   Ceftriaxone 2 g IV daily x 6 weeks. End Date: May 22, 2021  Question Answer Comment  Heart Failure Follow-up Care [facility] at  [phone]   Obtain the following labs Basic Metabolic Panel   Lab frequency Weekly   Fax lab results to AHF Clinic at [phone]   Diet Low Sodium Heart Healthy   Fluid restrictions: 1800 mL Fluid      04/17/21 1250              Discharge Care Instructions  (From admission, onward)           Start     Ordered   04/18/21 0000  Change dressing on IV access line weekly and PRN  (Home infusion instructions - [facility] )        04/18/21  0939            Disposition   The patient will be discharged in stable condition to home. Discharge Instructions     [facility] pharmacist to adjust dose for Vancomycin, Aminoglycosides and other anti-infective therapies as requested by physician.   Complete by: As directed    [facility] to provide Cath Flo 31m   Complete by: As directed    Administer for PICC line occlusion and as ordered by physician for other access device issues.   Anaphylaxis Kit: Provided to treat any anaphylactic reaction to the medication being provided to the patient if First Dose or when requested by physician   Complete by: As directed    Epinephrine 150mml vial / amp: Administer 0.10m21m0.10ml62mubcutaneously once for moderate to severe anaphylaxis, nurse to call physician and pharmacy when reaction occurs and call 911 if needed for immediate care   Diphenhydramine 50mg19mIV vial: Administer 25-50mg 5mM PRN for first dose reaction, rash, itching, mild reaction, nurse to call physician and pharmacy when reaction occurs   Sodium Chloride 0.9% NS 500ml I51mdminister if needed for hypovolemic blood pressure drop or as ordered by physician after call to physician with anaphylactic reaction   Change dressing on IV access line weekly and PRN   Complete by: As directed    Flush IV access with Sodium Chloride 0.9% and Heparin 10 units/ml or 100 units/ml   Complete by: As directed    Home infusion instructions - [facility]   Complete by: As directed    Instructions: Flush IV access with Sodium Chloride 0.9% and Heparin 10units/ml or 100units/ml   Change dressing on IV access line: Weekly and PRN   Instructions Cath Flo 2mg: Ad4mister for PICC Line occlusion and as ordered by physician for other access device   [facility] pharmacist to adjust dose for: Vancomycin, Aminoglycosides and other anti-infective therapies as requested by physician   Method of administration may be changed at the discretion of home infusion pharmacist based upon assessment of the patient and/or caregivers ability to self-administer the medication ordered   Complete by: As directed        Follow-up Information     [facility]   Why: Home Health RN, and Physical Therapy-agency will call to arrange visits Contact information: [phone] [place] Follow up.   Why: Infusion provider will supply IV antibiotics Contact information: [phone] [place].   Specialty: Cardiology Why: LVAD Clinic 05/18/21 at 9:00 AM Contact information: [phone]          Duration of Discharge Encounter: Greater than 35 minutes   Signed, BrittainLyda Jester12/28/2022, 12:22 PM   Agree with the above note.   Patient ready for discharge, close followup in LVAD clinic.   Lesley Atkin MLoralie Champagne022

## 2021-05-01 NOTE — Progress Notes (Signed)
[place] for warfarin + heparin Indication:  s/p VAD (HM3) , afib, hx DVT  No Active Allergies  Patient Measurements: Height: 6' (182.9 cm) Weight: 75.3 kg (166 lb 0.1 oz) IBW/kg (Calculated) : 77.6   Vital Signs: Temp: 98 F (36.7 C) (12/20 1139) Temp Source: Oral (12/20 1139) BP: 106/91 (12/20 1139) Pulse Rate: 74 (12/20 1139)  Labs: Recent Labs    04/29/21 0036 04/30/21 0510 05/01/21 0111  HGB  --  9.3* 9.6*  HCT  --  31.0* 31.5*  PLT  --  300 308  LABPROT 16.8* 16.4* 17.4*  INR 1.4* 1.3* 1.4*  HEPARINUNFRC <0.10* <0.10* <0.10*  CREATININE 0.75 0.95 0.71     Estimated Creatinine Clearance: 95.4 mL/min (by C-G formula based on SCr of 0.71 mg/dL).   Medical History: Past Medical History:  Diagnosis Date   AICD (automatic cardioverter/defibrillator) present 08/31/2019   Ankylosing spondylitis ([place])    Arthritis    BENIGN PROSTATIC HYPERTROPHY 06/07/2008   CHF (congestive heart failure) ([place])    COLONIC POLYPS, HX OF 06/07/2008   Depression    H/O hiatal hernia    Heart failure ([place])    HYPERLIPIDEMIA 06/07/2008   HYPERTENSION 06/07/2008   Myocardial infarction [facility]) 2005   NSTEMI, s/p LAD stent   NEPHROLITHIASIS, HX OF 06/07/2008   STEMI (ST elevation myocardial infarction) ([place]) 04/27/2019    Assessment: 65 yo M s/p HM 3 and AVR on 04/06/21. Prior to surgery/admit patient was on apixiban for a DVT.  Pt s/p thora 12/8, 12/9, and 12/12. INR subtherapeutic after lower doses for that procedure. Started fixed heparin dose at 400 uts/hr started on 12/12 with heparin level daily.  INR this morning  1.4 remains subtherapeutic - but starting to trend up with boosted doses.  Patient was sensitive o warfarin post op and previously therapeutic on warfarin 2.5mg  daily  So watch carefully with increased warfarin doses  Heparin level undectable at <0.1 as expected on 400 units/hr. No issues with bleeding per RN.  Goal of  Therapy:  Warfarin INR 2.0-2.5 Heparin Level <0.3 Monitor platelets by anticoagulation protocol: Yes   Plan:  Warfarin 7.5 mg PO x1 tonight  Heparin 400 units/h - no titrations Daily INR, heparin level, CBC, LDH ASA until 12/25    Bonnita Nasuti Pharm.D. CPP, BCPS Clinical Pharmacist [phone] 05/01/2021 3:41 PM   Please check [email or web address] for unit-specific pharmacy phone numbers.

## 2021-05-01 NOTE — Progress Notes (Signed)
Nutrition Follow-up  DOCUMENTATION CODES:   Severe malnutrition in context of chronic illness  INTERVENTION:   - Continue Ensure Enlive po TID, each supplement provides 350 kcal and 20 grams of protein   - Continue ProSource Plus 30 ml po QID, each supplement provides 100 kcal and 15 grams of protein   - Continue MVI with minerals daily   - Continue with liberalized Regular diet  NUTRITION DIAGNOSIS:   Severe Malnutrition related to chronic illness (CHF) as evidenced by severe muscle depletion, severe fat depletion.  Ongoing, being addressed via oral nutrition supplements and liberalized diet  GOAL:   Patient will meet greater than or equal to 90% of their needs  Progressing  MONITOR:   PO intake, Supplement acceptance, Labs, Weight trends, Skin, I & O's  REASON FOR ASSESSMENT:   Consult Enteral/tube feeding initiation and management, Assessment of nutrition requirement/status  ASSESSMENT:   65 y.o. male presented to the ED with sudden onset of dyspnea. PMH includes CAD, CHF with ICD, HTN, and on 2L [place] at baseline. Pt admitted with respiratory failure with hypoxia with SIRS.  10/27 - admitted 11/16 - s/p right heart cath 11/25 - s/p HM-3 LVAD placement and bioprosthetic AVR 11/26 - extubated 11/28 - surgical path with possible acute AoV endocarditis 12/08 - s/p L thoracentesis with 1.7 L fluid removed 12/09 - s/p R thoracentesis with 1.1 L fluid removed 12/12 - s/p L thoracentesis with 1 L fluid removed 12/18 - pt tested positive for COVID-19  Attempted to speak with pt via phone call; however, no answer. Noted PO intake has decreased with COVID-19 infection. Also noted pt with nausea. Will continue with liberalized regular diet and current oral nutrition supplement regimen.  Admit weight: 76.9 kg on 10/26 Current weight: 75.3 kg  Pt with mild pitting edema to BLE.  Meal Completion: 10-50%  Medications reviewed and include: ProSource Plus 30 ml QID, vitamin  C 500 mg daily, colace, Ensure Enlive TID, folic acid, SSI, levemir 10 units daily, melatonin, MVI with minerals daily, protonix, klor-con 60 mEq x 3, thiamine, torsemide, vitamin B-12, warfarin, zinc sulfate 220 mg daily, IV abx, heparin drip, IV magnesium sulfate 2 grams once, IV remdesivir  Labs reviewed: sodium 130, potassium 3.2, hemoglobin 9.6 CBG's: 83-126 x 24 hours  UOP: 2625 ml x 24 hours I/O's: -48.9 L since admit  Diet Order:   Diet Order             Diet regular Room service appropriate? Yes; Fluid consistency: Thin  Diet effective now                   EDUCATION NEEDS:   Education needs have been addressed  Skin:  Skin Assessment: Skin Integrity Issues: Stage I: coccyx Incisions: chest, L abdomen  Last BM:  04/30/21  Height:   Ht Readings from Last 1 Encounters:  03/07/21 6' (1.829 m)    Weight:   Wt Readings from Last 1 Encounters:  05/01/21 75.3 kg    Ideal Body Weight:  80.9 kg  BMI:  Body mass index is 22.51 kg/m.  Estimated Nutritional Needs:   Kcal:  2300-2500  Protein:  115-130 grams  Fluid:  >/= 2 L    Gustavus Bryant, MS, RD, LDN Inpatient Clinical Dietitian Please see AMiON for contact information.

## 2021-05-01 NOTE — Plan of Care (Signed)
°  Problem: Education: Goal: Knowledge of General Education information will improve Description: Including pain rating scale, medication(s)/side effects and non-pharmacologic comfort measures Outcome: Progressing   Problem: Health Behavior/Discharge Planning: Goal: Ability to manage health-related needs will improve Outcome: Progressing   Problem: Clinical Measurements: Goal: Ability to maintain clinical measurements within normal limits will improve Outcome: Progressing Goal: Will remain free from infection Outcome: Progressing Goal: Diagnostic test results will improve Outcome: Progressing Goal: Respiratory complications will improve Outcome: Progressing Goal: Cardiovascular complication will be avoided Outcome: Progressing   Problem: Activity: Goal: Risk for activity intolerance will decrease Outcome: Progressing   Problem: Nutrition: Goal: Adequate nutrition will be maintained Outcome: Progressing   Problem: Coping: Goal: Level of anxiety will decrease Outcome: Progressing   Problem: Elimination: Goal: Will not experience complications related to bowel motility Outcome: Progressing Goal: Will not experience complications related to urinary retention Outcome: Progressing   Problem: Pain Managment: Goal: General experience of comfort will improve Outcome: Progressing   Problem: Safety: Goal: Ability to remain free from injury will improve Outcome: Progressing   Problem: Skin Integrity: Goal: Risk for impaired skin integrity will decrease Outcome: Progressing   Problem: Education: Goal: Ability to demonstrate management of disease process will improve Outcome: Progressing Goal: Ability to verbalize understanding of medication therapies will improve Outcome: Progressing Goal: Individualized Educational Video(s) Outcome: Progressing   Problem: Activity: Goal: Capacity to carry out activities will improve Outcome: Progressing   Problem: Cardiac: Goal:  Ability to achieve and maintain adequate cardiopulmonary perfusion will improve Outcome: Progressing   Problem: Education: Goal: Knowledge of the prescribed therapeutic regimen will improve Outcome: Progressing   Problem: Activity: Goal: Risk for activity intolerance will decrease Outcome: Progressing   Problem: Cardiac: Goal: Ability to maintain an adequate cardiac output will improve Outcome: Progressing   Problem: Coping: Goal: Level of anxiety will decrease Outcome: Progressing   Problem: Fluid Volume: Goal: Risk for excess fluid volume will decrease Outcome: Progressing   Problem: Clinical Measurements: Goal: Ability to maintain clinical measurements within normal limits will improve Outcome: Progressing Goal: Will remain free from infection Outcome: Progressing

## 2021-05-02 LAB — CBC
HCT: 33 % — ABNORMAL LOW (ref 39.0–52.0)
Hemoglobin: 9.9 g/dL — ABNORMAL LOW (ref 13.0–17.0)
MCH: 23.7 pg — ABNORMAL LOW (ref 26.0–34.0)
MCHC: 30 g/dL (ref 30.0–36.0)
MCV: 79.1 fL — ABNORMAL LOW (ref 80.0–100.0)
Platelets: 303 10*3/uL (ref 150–400)
RBC: 4.17 MIL/uL — ABNORMAL LOW (ref 4.22–5.81)
RDW: 19.5 % — ABNORMAL HIGH (ref 11.5–15.5)
WBC: 6.8 10*3/uL (ref 4.0–10.5)
nRBC: 0 % (ref 0.0–0.2)

## 2021-05-02 LAB — CULTURE, BLOOD (ROUTINE X 2)
Culture: NO GROWTH
Culture: NO GROWTH
Special Requests: ADEQUATE

## 2021-05-02 LAB — BASIC METABOLIC PANEL
Anion gap: 8 (ref 5–15)
BUN: 14 mg/dL (ref 8–23)
CO2: 32 mmol/L (ref 22–32)
Calcium: 8.3 mg/dL — ABNORMAL LOW (ref 8.9–10.3)
Chloride: 91 mmol/L — ABNORMAL LOW (ref 98–111)
Creatinine, Ser: 0.8 mg/dL (ref 0.61–1.24)
GFR, Estimated: >60 mL/min (ref >60)
Glucose, Bld: 75 mg/dL (ref 70–99)
Potassium: 4.4 mmol/L (ref 3.5–5.1)
Sodium: 131 mmol/L — ABNORMAL LOW (ref 135–145)

## 2021-05-02 LAB — GLUCOSE, CAPILLARY
Glucose-Capillary: 75 mg/dL (ref 70–99)
Glucose-Capillary: 82 mg/dL (ref 70–99)
Glucose-Capillary: 126 mg/dL — ABNORMAL HIGH (ref 70–99)
Glucose-Capillary: 121 mg/dL — ABNORMAL HIGH (ref 70–99)

## 2021-05-02 LAB — LACTATE DEHYDROGENASE: LDH: 157 U/L (ref 98–192)

## 2021-05-02 LAB — PHOSPHORUS: Phosphorus: 3.7 mg/dL (ref 2.5–4.6)

## 2021-05-02 LAB — HEPARIN LEVEL (UNFRACTIONATED): Heparin Unfractionated: <0.1 IU/mL — ABNORMAL LOW (ref 0.30–0.70)

## 2021-05-02 LAB — D-DIMER, QUANTITATIVE: D-Dimer, Quant: 5.49 ug/mL-FEU — ABNORMAL HIGH (ref 0.00–0.50)

## 2021-05-02 LAB — FERRITIN: Ferritin: 1526 ng/mL — ABNORMAL HIGH (ref 24–336)

## 2021-05-02 LAB — PROTIME-INR
INR: 2 — ABNORMAL HIGH (ref 0.8–1.2)
Prothrombin Time: 22.6 seconds — ABNORMAL HIGH (ref 11.4–15.2)

## 2021-05-02 LAB — C-REACTIVE PROTEIN: CRP: 1.7 mg/dL — ABNORMAL HIGH (ref <1.0)

## 2021-05-02 LAB — MAGNESIUM: Magnesium: 1.9 mg/dL (ref 1.7–2.4)

## 2021-05-02 MED ORDER — TORSEMIDE 20 MG PO TABS: 40.0000 mg | ORAL_TABLET | Freq: Every day | ORAL | Status: DC

## 2021-05-02 MED ORDER — POTASSIUM CHLORIDE CRYS ER 20 MEQ PO TBCR
40.0000 meq | EXTENDED_RELEASE_TABLET | Freq: Once | ORAL | Status: AC
  Administered 2021-05-02: 17:00:00 40 meq via ORAL
  Filled 2021-05-02: qty 2

## 2021-05-02 MED ORDER — HYDRALAZINE HCL 20 MG/ML IJ SOLN
10.0000 mg | Freq: Once | INTRAMUSCULAR | Status: AC
  Administered 2021-05-03: 10 mg via INTRAVENOUS
  Filled 2021-05-02: qty 1

## 2021-05-02 MED ORDER — WARFARIN SODIUM 2.5 MG PO TABS
2.5000 mg | ORAL_TABLET | Freq: Once | ORAL | Status: AC
  Administered 2021-05-02: 17:00:00 2.5 mg via ORAL
  Filled 2021-05-02: qty 1

## 2021-05-02 NOTE — Progress Notes (Signed)
LVAD Coordinator Rounding Note:  Admitted 03/07/21 due to sepsis. Dr. [name] consulted as his HF Cardiologist.   HM III LVAD implanted on 04/06/21 by Dr. Cyndia Bent under Destination Therapy criteria. Not a transplant candidate at this time due to recurrent infections.   Pt sitting up in chair this am. Covid restrictions remain in place. Pt reports he is feeling slightly better today, chief complaint is fatigue.    Vital signs: Temp: 97.5 HR: 80 Doppler: 82 Automatic BP: 123/108 (115) O2 Sat: 96% on RA Wt:171.5>190>187.4>187>...174.3>172.4>175.4>175>174>167>166>163 lbs     LVAD interrogation reveals:  Speed: 5800 Flow: 5.1 Power: 4.9w PI: 4.0  Alarms: none Events: > 80 PI events on 05/01/21 Hematocrit: 30  Fixed speed: 5800 Low speed limit: 5500  Drive Line:  Existing VAD dressing  C/D/I with anchor intact and accurately applied. Continue twice a week dressing changes on Monday and Thursday per VAD Coordinator, Nurse Davonna Belling, or Tomi Bamberger. Next dressing change due 05/03/21.    Labs:  LDH trend: 309>248>215>206>201........193>160>141>115>127>135>152>158>158>144>174>157  INR trend: 1.6>1.3>1.3>1.4.....1.8>1.7>1.4>1.5>1.3>1.4>1.5>1.4>2.0   Anticoagulation Plan: -INR Goal: 2 - 2.5 -ASA Dose: 81 mg daily  Blood Products:   Intra-op: - 04/06/21>>4 PRBCs, 4 FFP, 1263 cc cell saver  Post-op: - 04/06/21>>1 unit Plts - 04/07/21>>1 unit PRBC  Device: N/A  Arrythmias: 03/22/21 - developed Afib w/ RVR, converted with IV amiodarone  Respiratory: extubated 04/07/21  Nitric Oxide: off 04/07/21  Gtts: Heparin 400 u/hr  Infection: - 04/11/21 Blood cultures x 2>> no growth x 5 days; final - 04/19/21 Body fluid cx (left lung)>> no growth <24 hours - 04/19/21 Gram stain (left lung)>> no WBC or organisms seen; final - 04/20/21 Gram stain (right lung)>> rare WBC, no organisms seen; final - 04/27/21 blood culture x 2>> pending - 04/27/21 Sputum>> negative - 04/27/21  Urine>>negative - 04/28/21 Covid>>positive   Patient/Family Teaching:  VAD discharge teaching completed with patient, his sister [name], and his friend Tomi Bamberger. See separate note for documentation.  Tomi Bamberger able to do dressing changes with bedside nurse.  Plan/Recommendations:  1. Call VAD Coordinator for any VAD equipment or drive line issues. 2.  Twice a week dressing changes per VAD Coordinator, Nurse Davonna Belling, or trained caregiver. 3. VAD discharge teaching completed with patient and family.  4. Possible discharge home tomorrow if INR therapeutic with completion of Remdesivir.  5. Will deliver home VAD equipment on day of discharge.  6. F/U VAD clinic appointment made for next week.    Zada Girt RN [place] Coordinator  Office: [phone]  24/7 Pager: [phone]

## 2021-05-02 NOTE — Progress Notes (Signed)
Physical Therapy Treatment Patient Details Name: Brandon [name] MRN: [id number] DOB: 1955/07/31 Today's Date: 05/02/2021   History of Present Illness Pt is a 65 y.o. male admitted 03/07/21 with acute on chronic hypoxemic respiratory failure, cardiogenic shock. CT scan which shows bronchopneumonia and no indication of ILD. Prolonged admission undergoing workup for VAD. S/p LVAD placement 11/25. CXR 12/8 with significant bilateral pleural effusions. S/p L thoracentesis 12/8 and 12/12.  S/p R thoracentesis 12/9. Tested positive for COVID 12/18 after exposed by visitor. Other PMH includes CHF, ankylosing spondylitis, bacteremia and  possible endocarditis s/p ICD removal, HTN, depression, arthritis.   PT Comments    Pt progressing with mobility. Pt demonstrates improving ability to switch LVAD from wall<>battery power while referencing typed instructions; main issue seems to be donning the vest. Increased time discussing plan for d/c home, including assist needs and activity/DME recommendations; do not feel pt requires SNF-level therapies. Will continue to follow acutely to address established goals.  SATURATION QUALIFICATIONS:  Patient Saturations on Room Air at Rest = 96% Patient Saturations on [employer] while Ambulating = 93% Patient Saturations on -- Liters of oxygen while Ambulating = N/A   Recommendations for follow up therapy are one component of a multi-disciplinary discharge planning process, led by the attending physician.  Recommendations may be updated based on patient status, additional functional criteria and insurance authorization.  Follow Up Recommendations  Home health PT     Assistance Recommended at Discharge Frequent or constant Supervision/Assistance  Equipment Recommendations  Rolling walker (2 wheels)    Recommendations for Other Services       Precautions / Restrictions Precautions Precautions: Fall;Sternal;Other (comment) Precaution Comments: LVAD, COVID      Mobility  Bed Mobility               General bed mobility comments: Received sitting in recliner    Transfers Overall transfer level: Needs assistance Equipment used: Rolling walker (2 wheels) Transfers: Sit to/from Stand Sit to Stand: Supervision           General transfer comment: good awareness of sternal precautions to hands, placing hands next to knees to stand    Ambulation/Gait Ambulation/Gait assistance: Supervision Gait Distance (Feet): 80 Feet Assistive device: Rolling walker (2 wheels) Gait Pattern/deviations: Step-through pattern;Decreased stride length;Trunk flexed       General Gait Details: Slow, mostly steady gait with RW and supervision for safety/lines; seated rest secondary to fatigue, pt endorses SOB (did not notice DOE)   [employer]ine [employer] (Stroke Patients Only)       Balance                                            Cognition Arousal/Alertness: Awake/alert Behavior During Therapy: WFL for tasks assessed/performed Overall Cognitive Status: No family/caregiver present to determine baseline cognitive functioning Area of Impairment: Attention;Memory;Safety/judgement;Problem solving;Awareness                   Current Attention Level: Selective Memory: Decreased short-term memory   Safety/Judgement: Decreased awareness of safety Awareness: Emergent Problem Solving: Requires verbal cues;Difficulty sequencing General Comments: Asks similar questions each session, "You're with PT? So what's your role?" Pt endorses feeling overwhelmed by situation, including COVID dx and caregiver being sick, anxious for return home ("I'm not sure what  I'm afraid of"). Able to have productive conversation regarding benefits of return home instead of SNF. Use of typed instructions for switching LVAD wall<>battery power        Exercises      General Comments General comments  (skin integrity, edema, etc.): Noted ted hose slid down to mid calf with various areas of bunching around ankle and lower leg; readjusted and pulled up to knees, educ pt on technique and need to keep them pulled up. Pt following typed instructions for LVAD wall<>battery power, main issue is related to donning vest which pt has difficulty keeping it from getting tangled; able to complete rest of process with supervision. SpO2 93% on RA with ambulation. Increased time discussing d/c plan for home      Pertinent Vitals/Pain Pain Assessment: No/denies pain Pain Intervention(s): Monitored during session    Home Living                          Prior Function            PT Goals (current goals can now be found in the care plan section) Progress towards PT goals: Progressing toward goals    Frequency    Min 3X/week      PT Plan Current plan remains appropriate    Co-evaluation              AM-PAC PT "6 Clicks" Mobility   Outcome Measure  Help needed turning from your back to your side while in a flat bed without using bedrails?: None Help needed moving from lying on your back to sitting on the side of a flat bed without using bedrails?: None Help needed moving to and from a bed to a chair (including a wheelchair)?: A Little Help needed standing up from a chair using your arms (e.g., wheelchair or bedside chair)?: A Little Help needed to walk in hospital room?: A Little Help needed climbing 3-5 steps with a railing? : A Little 6 Click Score: 20    End of Session   Activity Tolerance: Patient tolerated treatment well Patient left: in chair;with call bell/phone within reach Nurse Communication: Mobility status PT Visit Diagnosis: Difficulty in walking, not elsewhere classified (R26.2);Muscle weakness (generalized) (M62.81)     Time: 1583-0940 PT Time Calculation (min) (ACUTE ONLY): 43 min  Charges:  $Therapeutic Exercise: 8-22 mins $Therapeutic Activity: 8-22  mins $Self Care/Home Management: 8-22                     Mabeline Caras, PT, DPT Acute Rehabilitation Services  Pager [phone] Office [phone]  Derry Lory 05/02/2021, 2:00 PM

## 2021-05-02 NOTE — Plan of Care (Signed)
°  Problem: Education: Goal: Knowledge of General Education information will improve Description: Including pain rating scale, medication(s)/side effects and non-pharmacologic comfort measures Outcome: Progressing   Problem: Health Behavior/Discharge Planning: Goal: Ability to manage health-related needs will improve Outcome: Progressing   Problem: Clinical Measurements: Goal: Ability to maintain clinical measurements within normal limits will improve Outcome: Progressing Goal: Will remain free from infection Outcome: Progressing Goal: Respiratory complications will improve Outcome: Progressing Goal: Cardiovascular complication will be avoided Outcome: Progressing   Problem: Activity: Goal: Risk for activity intolerance will decrease Outcome: Progressing   Problem: Nutrition: Goal: Adequate nutrition will be maintained Outcome: Progressing   Problem: Coping: Goal: Level of anxiety will decrease Outcome: Progressing   Problem: Elimination: Goal: Will not experience complications related to bowel motility Outcome: Progressing Goal: Will not experience complications related to urinary retention Outcome: Progressing   Problem: Pain Managment: Goal: General experience of comfort will improve Outcome: Progressing   Problem: Safety: Goal: Ability to remain free from injury will improve Outcome: Progressing   Problem: Education: Goal: Ability to demonstrate management of disease process will improve Outcome: Progressing Goal: Ability to verbalize understanding of medication therapies will improve Outcome: Progressing Goal: Individualized Educational Video(s) Outcome: Progressing   Problem: Cardiac: Goal: Ability to achieve and maintain adequate cardiopulmonary perfusion will improve Outcome: Progressing   Problem: Activity: Goal: Risk for activity intolerance will decrease Outcome: Progressing   Problem: Cardiac: Goal: Ability to maintain an adequate cardiac  output will improve Outcome: Progressing

## 2021-05-02 NOTE — Progress Notes (Signed)
OT Cancellation Note  Patient Details Name: BRENN DEZIEL MRN: [id number] DOB: 01/18/1956   Cancelled Treatment:    Reason Eval/Treat Not Completed: Patient at procedure or test/ unavailable.  Pt working with PT.  Will reattempt.  Nilsa Nutting., OTR/L Acute Rehabilitation Services Pager [phone] Office [phone]   Lucille Passy M 05/02/2021, 11:31 AM

## 2021-05-02 NOTE — Progress Notes (Addendum)
Patient ID: JERONIMO HELLBERG, male   DOB: [birth year], 65 y.o.   MRN: [id number]      Advanced Heart Failure Rounding Note  PCP-Cardiologist: [name], MD  [facility]: Dr. [name]   Subjective:    Admitted with sepsis. Started on cefepime + vanc, now off abx (ID following). Afebrile.  10/29 Co-ox 48%, started on milrinone 0.25.  10/30 IV Antibiotics stopped. Blood cultures no growth for 5 days.  10/31 started on Augmentin for PNA.  11/1 Milrinone off 11/2 Milrinone added back. Midodrine increased to 10 mg TID 11/10 Developed Afib w/ RVR, converted with IV amio 11/14 Milrinone increased to 0.25 mcg. Diuresed with IV lasix  11/25 Underwent HM-3 VAD placement and bioprosthetic AVR. Had persistent hypotension in OR requiring methylene blue and high dose pressors.  11/26 Extubated 11/28 Started on lasix gtt>>later stopped due to low CVP (1) and increasing pressor requirements. Restarted on scheduled BID IV Lasix once pressure stabilized (still volume up)  11/28 Surgical path with evidence of possible "acute AoV endocarditis". ID consulted. Bcx recollected. Tissue sent to [facility] in [place].   11/30 Started on IV ceftriaxone to cover the Streptococcus gordonae that was isolated before, per ID. 11/30 VAD speed increased to 5600 12/1 Diuresed with IV lasix + metolazone. Milrinone was stopped. Amio drip transitioned to amio 200 mg twice a day.  12/5 Ramp Echo speed increased to 5800. Epi stopped.  12/6 Transferred to [place]. Had BM. Started on spiro 12.5 mg daily 12/8 CXR with significant bilateral pleural effusions. S/p Left Thoracentesis w/ 1.7 L out but still with residual fluid 12/9 Rt Thoracentesis  12/11 Given dose of tolvaptan and diuresed with IV lasix. Negative 2.5 liters.  12/12 Repeat Lt Thoracentesis w/ 1L Fluid removal  12/18 + SARS-CoV-2  LDH stable. INR up to 2 today.   Scr stable. Down another 3 lb overnight. On po Torsemide.  MAPs 90s  Feels more fatigued and notes muscle aches  today.  No dyspnea or CP  LVAD Interrogation HM 3: Speed: 5800 Flow: 4.9 PI: 3.6  Power: 5 12 PI events Parameters stable.    Objective:   Weight Range: 74.1 kg Body mass index is 22.16 kg/m.   Vital Signs:   Temp:  [97.3 F (36.3 C)-98.1 F (36.7 C)] 97.5 F (36.4 C) (12/21 0805) Pulse Rate:  [72-80] 80 (12/21 0805) Resp:  [17-22] 18 (12/21 0805) BP: (92-123)/(54-108) 123/108 (12/21 0805) SpO2:  [93 %-96 %] 93 % (12/21 0805) Weight:  [74.1 kg] 74.1 kg (12/21 0615) Last BM Date: 05/01/21  Weight change: Filed Weights   04/30/21 0345 05/01/21 0500 05/02/21 0615  Weight: 76 kg 75.3 kg 74.1 kg    Intake/Output:   Intake/Output Summary (Last 24 hours) at 05/02/2021 0901 Last data filed at 05/02/2021 0819 Gross per 24 hour  Intake 1463.98 ml  Output 2800 ml  Net -1336.02 ml     Physical Exam   MAPs 90s Physical Exam: GENERAL: Sitting up in chair. No distress HEENT: normal  NECK: Supple, No JVD.  2+ bilaterally, no bruits.   CARDIAC:  Mechanical heart sounds with LVAD hum present.  LUNGS:  Crackles in bases ABDOMEN:  Soft, round, nontender, positive bowel sounds x4.     LVAD exit site: well-healed and incorporated.  Dressing dry and intact.  No erythema or drainage.  Stabilization device present and accurately applied.  Driveline dressing is being changed daily per sterile technique. EXTREMITIES:  Warm and dry, no cyanosis, clubbing, rash or edema, TED hose  on  NEUROLOGIC:  Alert and oriented x 4.  Gait steady.  No aphasia.  No dysarthria.  Affect pleasant.        Telemetry   NSR 70s-80s, rare PVCs  Labs    CBC Recent Labs    05/01/21 0111 05/02/21 0129  WBC 5.5 6.8  HGB 9.6* 9.9*  HCT 31.5* 33.0*  MCV 78.8* 79.1*  PLT 308 539   Basic Metabolic Panel Recent Labs    05/01/21 0111 05/02/21 0129  NA 130* 131*  K 3.2* 4.4  CL 90* 91*  CO2 30 32  GLUCOSE 96 75  BUN 14 14  CREATININE 0.71 0.80  CALCIUM 7.9* 8.3*  MG 1.9 1.9  PHOS 3.9 3.7    Liver Function Tests No results for input(s): AST, ALT, ALKPHOS, BILITOT, PROT, ALBUMIN in the last 72 hours.    No results for input(s): LIPASE, AMYLASE in the last 72 hours. Cardiac Enzymes No results for input(s): CKTOTAL, CKMB, CKMBINDEX, TROPONINI in the last 72 hours.  BNP: BNP (last 3 results) Recent Labs    04/13/21 0333 04/20/21 0600 04/26/21 0420  BNP 438.2* 193.0* 273.9*    ProBNP (last 3 results) No results for input(s): PROBNP in the last 8760 hours.    D-Dimer Recent Labs    05/01/21 0111 05/02/21 0129  DDIMER 6.04* 5.49*   Hemoglobin A1C No results for input(s): HGBA1C in the last 72 hours.  Fasting Lipid Panel No results for input(s): CHOL, HDL, LDLCALC, TRIG, CHOLHDL, LDLDIRECT in the last 72 hours. Thyroid Function Tests No results for input(s): TSH, T4TOTAL, T3FREE, THYROIDAB in the last 72 hours.  Invalid input(s): FREET3  Other results:   Imaging    No results found.   Medications:     Scheduled Medications:  (feeding supplement) PROSource Plus  30 mL Oral TID PC & HS   sodium chloride   Intravenous Once   amiodarone  200 mg Oral Daily   vitamin C  500 mg Oral Daily   aspirin EC  81 mg Oral Daily   atorvastatin  80 mg Oral Daily   digoxin  0.125 mg Oral Daily   doxycycline  100 mg Oral Q12H   eplerenone  25 mg Oral Daily   ezetimibe  10 mg Oral Daily   feeding supplement  237 mL Oral TID BM   folic acid  1 mg Oral Daily   gabapentin  300 mg Oral TID   Gerhardt's butt cream   Topical Daily   insulin aspart  0-15 Units Subcutaneous TID WC   insulin detemir  10 Units Subcutaneous Daily   mouth rinse  15 mL Mouth Rinse BID   melatonin  5 mg Oral QHS   multivitamin with minerals  1 tablet Oral Q lunch   pantoprazole  40 mg Oral Daily   sertraline  100 mg Oral Daily   tamsulosin  0.4 mg Oral Daily   thiamine  100 mg Oral Daily   torsemide  40 mg Oral BID   vitamin B-12  100 mcg Oral Daily   Warfarin - Pharmacist Dosing  Inpatient   Does not apply q1600   zinc sulfate  220 mg Oral Daily    Infusions:  sodium chloride Stopped (04/14/21 1149)   cefTRIAXone (ROCEPHIN)  IV 200 mL/hr at 05/01/21 2100   heparin 400 Units/hr (05/02/21 0300)   lactated ringers Stopped (04/13/21 1053)   remdesivir 100 mg in NS 100 mL 200 mL/hr at 05/01/21 2100  PRN Medications: sodium chloride, acetaminophen, bisacodyl **OR** bisacodyl, lidocaine, morphine injection, ondansetron (ZOFRAN) IV, oxyCODONE, polyvinyl alcohol, sodium chloride flush, traMADol    Patient Profile   Mr Prins is a 65 year old with history of smoker, CAD s/p previous MI, HTN, HL, chronic HFrEF, sepsis 11/2020 bacteremia.  Admitted with sepsis. Bld Cx - NGTD    Assessment/Plan   1. Acute on chronic systolic HFr EF -> cardiogenic shock - Echo 05/19/20 EF 20-25% RV mildly HK. moderate AS  Mean gradient 13 AVA 1.2 cm2 DI 0.30 - Persistent NYHA IV. Admitted with shock - s/p HM-III VAD + bioprosthetic AVR on 11/25 - RV looked good on TEE in OR - Continue digoxin 0.125.  -  MAPs 90s . Now off midodrine. - May need to consider adding ARB - Continue eplerenone 25 daily - Volume looks okay. Weight continues to trend down, 27 lb last 10 days. Reduce Torsemide to 40 mg daily. - Renal function stable.   2. HM-3 LVAD implant - Plan as above - VAD interrogated personally. Parameters stable. - Continue ASA 81 for 30 days. Stop ASA 05/14/21  - INR 2.0. this am. Likely can stop heparin gtt. Will discuss with PharmD. - LDH stable  - Had ramp ECHO speed increased to 5800. Rare PI events.   3. Acute Hypoxic Respiratory Failure  - Extubated 11/26, stable on 2L [place].    4. 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 CAD  s/p anterior STEMI (12/20). LHC showed chronically occluded RCA (with L>>R collaterals) and thrombotic occlusion of proximal LAD. Underwent PCI of  LAD. - No s/s angina - Continue atorvastatin 80.   6. Iron-deficiency anemia & ABLA - Rec'd Feraheme 11/8 and 11/13  - Rec'd 1 unit PRBCs on 12/10 - Keep hgb > 8.0,  9.9 today - 04/23/21 Iron sats low. Given feraheme  7. Hypokalemia/ Hypomagnesemia  - Keep K > 4.0 Mg > 2.0  - Supp as needed  8. Severe protein-calorie malnutrition - prealbumin 11>15> 13 >6.6 - Continue aggressive nutritional support  9. Paroxysmal Atrial Fibrillation w/ RVR - new, developed 11/10. Maintaining SR/ST    - Off amio drip. Continue amio 200 mg once daily  - on warfarin/heparin. INR 2.0 today. Will discuss heparin with PharmD.  10. Hyponatremia - Na 131 - tolvaptan on 12/11.  - monitor   11. Sepsis - H/o strep bacteremia 11/2020.  ICD extracted 12/04/20. Barostim was not removed as it is extravascular.  - Received IV antibiotics until 01/15/21. Blood cultures negative 01/30/21..  - 10/26 -Blood CX x2  Negative  - Ct chest concerning for pneumonia. 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   PCT < 0.1, WBCs 7, afebrile.  Cultures NGTD,  vancomycin stopped.   - 12/18 + SARS-CoV-2. See below.   12. Hemorrhagic Pericarditis - Fibrin clot encasing heart at time of surgery.   - Will need to be careful with anticoagulation  - PharmD managing.   5. 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  - Bcx recollected and pending  - Tissue sent to [facility] in [place]  for broad 16 S ribosomal sequencing for bacterial pathogens as well as T wet Bhilai Bartonella Brucella  - ID following, recommending  IV ceftriaxone to cover the Streptococcus gordonae that was  isolated before given possible acute AoV endocarditis on surgical path. - Ceftriaxone 2 g IV daily x 6 weeks. End Date: May 22, 2021 - Home IV abx management has been arranged with [facility] and [place] order for PICC line placement tomorrow  14. Constipation - Resolved.    15. Bilateral pleural effusions - S/p Left thoracentesis 12/8 but still with residual fluid. Will likely need repeat L tap in a few days - R thoracentesis 12/9.  - S/p repeat left thoracentesis 12/12 w/ 1L Fluid removal  - Repeat CXR 12/15 w/ small b/l effusions.  - Lungs okay on exam. O2 sats stable.  16. Left Shoulder Pain - post thoracentesis. Post procedure CXR negative for PTX  - WBC 5.3  - suspect musculoskeletal etiology. Normal AROM  - low suspicion for gout - much improved, continue supportive care - PRN tylenol, heating pad    17. SARS-CoV-2 -On remdesivir, last dose 12/22   Disposition:  -Likely discharge later this week with HH/PT.  -Caregivers diagnosed with COVID infection a few days ago but hopefully improved enough to assist him later this week   Length of Stay: [place], [place], PA-C  05/02/2021, 9:01 AM  Advanced Heart Failure Team Pager [phone] (M-F; 7a - 5p)  Please contact [facility] for night-coverage after hours (5p -7a ) and weekends on [email or web address]  Patient seen and examined with the above-signed Advanced Practice Provider and/or Housestaff. I personally reviewed laboratory data, imaging studies and relevant notes. I independently examined the patient and formulated the important aspects of the plan. I have edited the note to reflect any of my changes or salient points. I have personally discussed the plan with the patient and/or family.  Remains on remdesivir for COVID. Starting to feel better. Afebrile. INR 2.0 this am so we stopped heparin. Volume status continues to improve on torsemide 40 bid. Weight now down below pre-op Remains on IV abx.    General:  NAD.  HEENT: normal  Neck: supple. JVP not elevated.  Carotids 2+ bilat; no bruits. No lymphadenopathy or thryomegaly appreciated. Cor: LVAD hum.  Lungs: Clear. Abdomen: soft, nontender, non-distended. No hepatosplenomegaly. No bruits or masses. Good bowel sounds. Driveline site clean. Anchor in place.  Extremities: no cyanosis, clubbing, rash. Warm 1+ edema  + TED Neuro: alert & oriented x 3. No focal deficits. Moves all 4 without problem   Continue COVID 19 treatment. INR now therapeutic. Heparin stopped. Discussed warfarin dosing with PharmD personally. Continue IV abx. Discussed disposition with him. Would tentatively plan d/c home once caregiver has recovered sufficiently from [place]. [place] interrogated personally. Parameters stable.   Glori Bickers, MD  9:57 PM

## 2021-05-02 NOTE — Consult Note (Signed)
° °  [facility] Inpatient Consult   05/02/2021  KERMITT HARJO 22-Sep-1955 [id number]  [place] [facility] [ACO] Patient: Medicare CMS DCE  *Follow up documentation, LLOS 55 days  Primary Care Provider:  [name], MD,  [place] an embedded provider with a chronic care management program and team  *Patient is followed by the Adv HF LVAD team for chronic care management team  Patient reviewed for extreme high risk score for unplanned readmission risk for any Embedded Chronic Care Management service needs for post hospital transition.  Review of patient's medical record reveals patient is for possible transition home and managed by LVAD team.  For questions contact:   Natividad Brood, RN BSN [facility] Liaison  [phone] business mobile phone Toll free office [phone]  Fax number: [phone] [email or web address]

## 2021-05-02 NOTE — Progress Notes (Signed)
[place] for warfarin + heparin Indication:  s/p VAD (HM3) , afib, hx DVT  No Active Allergies  Patient Measurements: Height: 6' (182.9 cm) Weight: 74.1 kg (163 lb 5.8 oz) IBW/kg (Calculated) : 77.6   Vital Signs: Temp: 98 F (36.7 C) (12/21 1108) Temp Source: Oral (12/21 1108) BP: 111/92 (12/21 1108) Pulse Rate: 82 (12/21 1108)  Labs: Recent Labs    04/30/21 0510 05/01/21 0111 05/02/21 0129  HGB 9.3* 9.6* 9.9*  HCT 31.0* 31.5* 33.0*  PLT 300 308 303  LABPROT 16.4* 17.4* 22.6*  INR 1.3* 1.4* 2.0*  HEPARINUNFRC <0.10* <0.10* <0.10*  CREATININE 0.95 0.71 0.80     Estimated Creatinine Clearance: 93.9 mL/min (by C-G formula based on SCr of 0.8 mg/dL).   Medical History: Past Medical History:  Diagnosis Date   AICD (automatic cardioverter/defibrillator) present 08/31/2019   Ankylosing spondylitis ([place])    Arthritis    BENIGN PROSTATIC HYPERTROPHY 06/07/2008   CHF (congestive heart failure) ([place])    COLONIC POLYPS, HX OF 06/07/2008   Depression    H/O hiatal hernia    Heart failure ([place])    HYPERLIPIDEMIA 06/07/2008   HYPERTENSION 06/07/2008   Myocardial infarction [facility]) 2005   NSTEMI, s/p LAD stent   NEPHROLITHIASIS, HX OF 06/07/2008   STEMI (ST elevation myocardial infarction) ([place]) 04/27/2019    Assessment: 65 yo M s/p HM 3 and AVR on 04/06/21. Prior to surgery/admit patient was on apixiban for a DVT.  Pt s/p thora 12/8, 12/9, and 12/12. INR subtherapeutic after lower doses for that procedure. Started fixed heparin dose at 400 uts/hr started on 12/12 with heparin level daily.  INR this morning up to 2, will stop heparin. Hgb stable in 9s. No bleeding issues noted. LDH 157.   Patient was sensitive to warfarin post op and previously therapeutic on warfarin 2.5mg  daily  So watch carefully with increased warfarin doses  Goal of Therapy:  Warfarin INR 2.0-2.5 Heparin Level <0.3 Monitor platelets by anticoagulation  protocol: Yes   Plan:  Warfarin 2.5 mg PO x1 tonight  Stop heparin ASA until 12/25  Erin Hearing PharmD., BCPS Clinical Pharmacist 05/02/2021 1:02 PM

## 2021-05-02 NOTE — Progress Notes (Signed)
BP trending up, 128/101 (110), Doppler 110.  Pt denies any s/s.  Spoke with [place], LVAD co ordinator, no orders given at this time.  Will continue to monitor.

## 2021-05-03 ENCOUNTER — Inpatient Hospital Stay: Payer: Self-pay

## 2021-05-03 DIAGNOSIS — U071 COVID-19: Secondary | ICD-10-CM

## 2021-05-03 LAB — CBC
HCT: 35 % — ABNORMAL LOW (ref 39.0–52.0)
Hemoglobin: 10.5 g/dL — ABNORMAL LOW (ref 13.0–17.0)
MCH: 24 pg — ABNORMAL LOW (ref 26.0–34.0)
MCHC: 30 g/dL (ref 30.0–36.0)
MCV: 79.9 fL — ABNORMAL LOW (ref 80.0–100.0)
Platelets: 283 10*3/uL (ref 150–400)
RBC: 4.38 MIL/uL (ref 4.22–5.81)
RDW: 19.3 % — ABNORMAL HIGH (ref 11.5–15.5)
WBC: 7.8 10*3/uL (ref 4.0–10.5)
nRBC: 0 % (ref 0.0–0.2)

## 2021-05-03 LAB — BASIC METABOLIC PANEL
Anion gap: 10 (ref 5–15)
BUN: 13 mg/dL (ref 8–23)
CO2: 30 mmol/L (ref 22–32)
Calcium: 8.8 mg/dL — ABNORMAL LOW (ref 8.9–10.3)
Chloride: 92 mmol/L — ABNORMAL LOW (ref 98–111)
Creatinine, Ser: 0.93 mg/dL (ref 0.61–1.24)
GFR, Estimated: >60 mL/min (ref >60)
Glucose, Bld: 107 mg/dL — ABNORMAL HIGH (ref 70–99)
Potassium: 3.9 mmol/L (ref 3.5–5.1)
Sodium: 132 mmol/L — ABNORMAL LOW (ref 135–145)

## 2021-05-03 LAB — FERRITIN: Ferritin: 1417 ng/mL — ABNORMAL HIGH (ref 24–336)

## 2021-05-03 LAB — PHOSPHORUS: Phosphorus: 3.7 mg/dL (ref 2.5–4.6)

## 2021-05-03 LAB — MAGNESIUM: Magnesium: 1.7 mg/dL (ref 1.7–2.4)

## 2021-05-03 LAB — GLUCOSE, CAPILLARY
Glucose-Capillary: 90 mg/dL (ref 70–99)
Glucose-Capillary: 120 mg/dL — ABNORMAL HIGH (ref 70–99)
Glucose-Capillary: 121 mg/dL — ABNORMAL HIGH (ref 70–99)
Glucose-Capillary: 111 mg/dL — ABNORMAL HIGH (ref 70–99)

## 2021-05-03 LAB — LACTATE DEHYDROGENASE: LDH: 170 U/L (ref 98–192)

## 2021-05-03 LAB — PROTIME-INR
INR: 2.2 — ABNORMAL HIGH (ref 0.8–1.2)
Prothrombin Time: 24 seconds — ABNORMAL HIGH (ref 11.4–15.2)

## 2021-05-03 LAB — C-REACTIVE PROTEIN: CRP: 1.9 mg/dL — ABNORMAL HIGH (ref <1.0)

## 2021-05-03 LAB — D-DIMER, QUANTITATIVE: D-Dimer, Quant: 5.38 ug/mL-FEU — ABNORMAL HIGH (ref 0.00–0.50)

## 2021-05-03 MED ORDER — LOSARTAN POTASSIUM 50 MG PO TABS: 100.0000 mg | ORAL_TABLET | Freq: Every day | ORAL | Status: DC

## 2021-05-03 MED ORDER — SODIUM CHLORIDE 0.9% FLUSH
10.0000 mL | Freq: Two times a day (BID) | INTRAVENOUS | Status: DC
Start: 2021-05-03 — End: 2021-05-09
  Administered 2021-05-03 – 2021-05-09 (×12): 10 mL

## 2021-05-03 MED ORDER — CHLORHEXIDINE GLUCONATE CLOTH 2 % EX PADS
6.0000 | MEDICATED_PAD | Freq: Every day | CUTANEOUS | Status: DC
  Administered 2021-05-03 – 2021-05-08 (×6): 6 via TOPICAL

## 2021-05-03 MED ORDER — WARFARIN SODIUM 2.5 MG PO TABS
2.5000 mg | ORAL_TABLET | Freq: Once | ORAL | Status: AC
  Administered 2021-05-03: 17:00:00 2.5 mg via ORAL
  Filled 2021-05-03: qty 1

## 2021-05-03 MED ORDER — SODIUM CHLORIDE 0.9% FLUSH: 10.0000 mL | INTRAVENOUS | Status: DC | PRN

## 2021-05-03 MED ORDER — POTASSIUM CHLORIDE CRYS ER 20 MEQ PO TBCR
40.0000 meq | EXTENDED_RELEASE_TABLET | Freq: Once | ORAL | Status: AC
  Administered 2021-05-03: 10:00:00 40 meq via ORAL
  Filled 2021-05-03: qty 2

## 2021-05-03 MED ORDER — LOSARTAN POTASSIUM 25 MG PO TABS
25.0000 mg | ORAL_TABLET | Freq: Once | ORAL | Status: AC
  Administered 2021-05-03: 17:00:00 25 mg via ORAL
  Filled 2021-05-03: qty 1

## 2021-05-03 MED ORDER — MAGNESIUM SULFATE 4 GM/100ML IV SOLN
4.0000 g | Freq: Once | INTRAVENOUS | Status: AC
  Administered 2021-05-03: 10:00:00 4 g via INTRAVENOUS
  Filled 2021-05-03: qty 100

## 2021-05-03 MED ORDER — TORSEMIDE 20 MG PO TABS
40.0000 mg | ORAL_TABLET | Freq: Two times a day (BID) | ORAL | Status: DC
  Administered 2021-05-03 – 2021-05-04 (×3): 40 mg via ORAL
  Filled 2021-05-03 (×3): qty 2

## 2021-05-03 MED ORDER — LOSARTAN POTASSIUM 25 MG PO TABS
25.0000 mg | ORAL_TABLET | Freq: Every day | ORAL | Status: DC
  Administered 2021-05-04 – 2021-05-08 (×5): 25 mg via ORAL
  Filled 2021-05-03 (×5): qty 1

## 2021-05-03 NOTE — Progress Notes (Signed)
Peripherally Inserted Central Catheter Placement  The IV Nurse has discussed with the patient and/or persons authorized to consent for the patient, the purpose of this procedure and the potential benefits and risks involved with this procedure.  The benefits include less needle sticks, lab draws from the catheter, and the patient may be discharged home with the catheter. Risks include, but not limited to, infection, bleeding, blood clot (thrombus formation), and puncture of an artery; nerve damage and irregular heartbeat and possibility to perform a PICC exchange if needed/ordered by physician.  Alternatives to this procedure were also discussed.  Bard Power PICC patient education guide, fact sheet on infection prevention and patient information card has been provided to patient /or left at bedside.    PICC Placement Documentation  PICC Single Lumen 65/78/46 Left Basilic 45 cm 1 cm (Active)  Indication for Insertion or Continuance of Line Home intravenous therapies (PICC only) 05/03/21 1400  Exposed Catheter (cm) 1 cm 05/03/21 1400  Site Assessment Clean;Dry;Intact 05/03/21 1400  Line Status Flushed;Saline locked;Blood return noted 05/03/21 1400  Dressing Type Transparent;Securing device 05/03/21 1400  Dressing Status Clean;Dry;Intact 05/03/21 1400  Antimicrobial disc in place? Yes 05/03/21 1400  Safety Lock Not Applicable 96/29/52 8413  Line Care Connections checked and tightened 05/03/21 1400  Dressing Intervention New dressing 05/03/21 1400  Dressing Change Due 05/10/21 05/03/21 1400       Holley Bouche [place] 05/03/2021, 2:47 PM

## 2021-05-03 NOTE — Plan of Care (Signed)
°  Problem: Education: Goal: Knowledge of General Education information will improve Description: Including pain rating scale, medication(s)/side effects and non-pharmacologic comfort measures Outcome: Progressing   Problem: Health Behavior/Discharge Planning: Goal: Ability to manage health-related needs will improve Outcome: Progressing   Problem: Clinical Measurements: Goal: Will remain free from infection Outcome: Progressing   Problem: Activity: Goal: Risk for activity intolerance will decrease Outcome: Progressing   Problem: Nutrition: Goal: Adequate nutrition will be maintained Outcome: Progressing   Problem: Coping: Goal: Level of anxiety will decrease Outcome: Progressing

## 2021-05-03 NOTE — Progress Notes (Signed)
LVAD Coordinator Rounding Note:  Admitted 03/07/21 due to sepsis. Dr. [name] consulted as his HF Cardiologist.   HM III LVAD implanted on 04/06/21 by Dr. Cyndia Bent under Destination Therapy criteria. Not a transplant candidate at this time due to recurrent infections.   Pt sitting up in chair this am. Covid restrictions remain in place. Pt reports he is feeling slightly better today, chief complaint is fatigue.    Drive line dressing change performed today - see below for details. All CT sutures removed today.  Vital signs: Temp: 97.8 HR: 80 Doppler: 88 Automatic BP: 108/95 (101) O2 Sat: 98% on 2 L/[place] Wt:171.5>190>187.4>187>...174.3>172.4>175.4>175>174>167>166>163>163.5 lbs     LVAD interrogation reveals:  Speed: 5800 Flow: 5.1 Power: 4.6w PI: 3.3  Alarms: none Events: 20 today; 60+ yesterday Hematocrit: 35  Fixed speed: 5800 Low speed limit: 5500  Drive Line:  Existing VAD dressing removed and site care performed using sterile technique. Drive line exit site cleaned with Chlora prep applicators x 2, rinsed with saline, allowed to dry, and gauze dressing with Silver strip applied. Exit site healing and unincorporated, the velour is fully implanted at exit site. Slight redness, no tenderness, foul odor or rash noted. Scant amount of yellow drainge. Suture around driveline removed today as well at CT sutures. Drive line anchor re-applied. Continue twice a week dressing changes on Monday and Thursday per VAD Coordinator, Nurse Davonna Belling, or Tomi Bamberger. Next dressing change due 05/07/21.      Labs:  LDH trend: 309>248>215>206>201.....152>158>158>144>174>157>170  INR trend: 1.6>1.3>1.3>1.4.....1.8>1.7>1.4>1.5>1.3>1.4>1.5>1.4>2.0>2.2   Anticoagulation Plan: -INR Goal: 2 - 2.5 -ASA Dose: 81 mg daily  Blood Products:   Intra-op: - 04/06/21>>4 PRBCs, 4 FFP, 1263 cc cell saver  Post-op: - 04/06/21>>1 unit Plts - 04/07/21>>1 unit PRBC  Device: N/A  Arrythmias: 03/22/21 -  developed Afib w/ RVR, converted with IV amiodarone  Respiratory: extubated 04/07/21  Nitric Oxide: off 04/07/21  Gtts: Heparin 400 u/hr-off  Infection: - 04/11/21 Blood cultures x 2>> no growth x 5 days; final - 04/19/21 Body fluid cx (left lung)>> no growth <24 hours - 04/19/21 Gram stain (left lung)>> no WBC or organisms seen; final - 04/20/21 Gram stain (right lung)>> rare WBC, no organisms seen; final - 04/27/21 blood culture x 2>> negative - 04/27/21 Sputum>> negative - 04/27/21 Urine>>negative - 04/28/21 Covid>>positive   Patient/Family Teaching:  VAD discharge teaching completed with patient, his sister [name], and his friend Tomi Bamberger. See separate note for documentation.  Tomi Bamberger able to do dressing changes with bedside nurse.  Plan/Recommendations:  1. Call VAD Coordinator for any VAD equipment or drive line issues. 2.  Twice a week dressing changes per VAD Coordinator, Nurse Davonna Belling, or trained caregiver. 3. VAD discharge teaching completed with patient and family.  4. Possible discharge home tomorrow if INR therapeutic with completion of Remdesivir.  5. Will deliver home VAD equipment on day of discharge.  6. F/U VAD clinic appointment made for next week.   Tanda Rockers RN [employer] Coordinator  Office: [phone]  24/7 Pager: [phone]

## 2021-05-03 NOTE — Progress Notes (Addendum)
Patient ID: Brandon [name], male   DOB: 19-Jan-1956, 65 y.o.   MRN: [id number]      Advanced Heart Failure Rounding Note  PCP-Cardiologist: [name], MD  [facility]: Dr. [name]   Subjective:    Admitted with sepsis. Started on cefepime + vanc, now off abx (ID following). Afebrile.  10/29 Co-ox 48%, started on milrinone 0.25.  10/30 IV Antibiotics stopped. Blood cultures no growth for 5 days.  10/31 started on Augmentin for PNA.  11/1 Milrinone off 11/2 Milrinone added back. Midodrine increased to 10 mg TID 11/10 Developed Afib w/ RVR, converted with IV amio 11/14 Milrinone increased to 0.25 mcg. Diuresed with IV lasix  11/25 Underwent HM-3 VAD placement and bioprosthetic AVR. Had persistent hypotension in OR requiring methylene blue and high dose pressors.  11/26 Extubated 11/28 Started on lasix gtt>>later stopped due to low CVP (1) and increasing pressor requirements. Restarted on scheduled BID IV Lasix once pressure stabilized (still volume up)  11/28 Surgical path with evidence of possible "acute AoV endocarditis". ID consulted. Bcx recollected. Tissue sent to [facility] in [place].   11/30 Started on IV ceftriaxone to cover the Streptococcus gordonae that was isolated before, per ID. 11/30 VAD speed increased to 5600 12/1 Diuresed with IV lasix + metolazone. Milrinone was stopped. Amio drip transitioned to amio 200 mg twice a day.  12/5 Ramp Echo speed increased to 5800. Epi stopped.  12/6 Transferred to [place]. Had BM. Started on spiro 12.5 mg daily 12/8 CXR with significant bilateral pleural effusions. S/p Left Thoracentesis w/ 1.7 L out but still with residual fluid 12/9 Rt Thoracentesis  12/11 Given dose of tolvaptan and diuresed with IV lasix. Negative 2.5 liters.  12/12 Repeat Lt Thoracentesis w/ 1L Fluid removal  12/18 + SARS-CoV-2  LDH stable. INR 2.2.  Scr stable. On po Torsemide.  MAPs 90s-100s  Feeling okay. Less aches today. No dyspnea or cough. Notes a little chest  discomfort with deep inspiration  LVAD Interrogation HM 3: Speed: 5800 Flow: 5.3 PI: 3.7  Power: 5 ~ 20 PI events Parameters stable.    Objective:   Weight Range: 74.2 kg Body mass index is 22.19 kg/m.   Vital Signs:   Temp:  [97.5 F (36.4 C)-98.5 F (36.9 C)] 97.8 F (36.6 C) (12/22 0333) Pulse Rate:  [75-82] 76 (12/21 2255) Resp:  [15-20] 15 (12/22 0333) BP: (90-128)/(79-108) 90/79 (12/22 0333) SpO2:  [92 %-98 %] 98 % (12/22 0333) Weight:  [74.2 kg] 74.2 kg (12/22 0500) Last BM Date: 05/02/21  Weight change: Filed Weights   05/01/21 0500 05/02/21 0615 05/03/21 0500  Weight: 75.3 kg 74.1 kg 74.2 kg    Intake/Output:   Intake/Output Summary (Last 24 hours) at 05/03/2021 0725 Last data filed at 05/03/2021 0700 Gross per 24 hour  Intake 1160 ml  Output 1500 ml  Net -340 ml     Physical Exam   MAPs 90s-100s Physical Exam: GENERAL: Sitting up in chair HEENT: normal  NECK: Supple, No JVD.  2+ bilaterally, no bruits.  No lymphadenopathy or thyromegaly appreciated.   CARDIAC:  Mechanical heart sounds with LVAD hum present.  LUNGS:  Clear to auscultation bilaterally.  ABDOMEN:  Soft, round, nontender, positive bowel sounds x4.     LVAD exit site: well-healed and incorporated.  Dressing dry and intact.  No erythema or drainage.  Stabilization device present and accurately applied.  Driveline dressing is being changed daily per sterile technique. EXTREMITIES:  Warm and dry, no cyanosis, clubbing, rash, TED hose  on, pitting edema in feet/lower shins  NEUROLOGIC:  Alert and oriented x 4.  Gait steady.  No aphasia.  No dysarthria.  Affect pleasant.      Telemetry   SR 70s, + PVCs (<10/min), 5 beat run NSVT  Labs    CBC Recent Labs    05/02/21 0129 05/03/21 0128  WBC 6.8 7.8  HGB 9.9* 10.5*  HCT 33.0* 35.0*  MCV 79.1* 79.9*  PLT 303 259   Basic Metabolic Panel Recent Labs    05/02/21 0129 05/03/21 0128  NA 131* 132*  K 4.4 3.9  CL 91* 92*  CO2 32  30  GLUCOSE 75 107*  BUN 14 13  CREATININE 0.80 0.93  CALCIUM 8.3* 8.8*  MG 1.9 1.7  PHOS 3.7 3.7   Liver Function Tests No results for input(s): AST, ALT, ALKPHOS, BILITOT, PROT, ALBUMIN in the last 72 hours.    No results for input(s): LIPASE, AMYLASE in the last 72 hours. Cardiac Enzymes No results for input(s): CKTOTAL, CKMB, CKMBINDEX, TROPONINI in the last 72 hours.  BNP: BNP (last 3 results) Recent Labs    04/13/21 0333 04/20/21 0600 04/26/21 0420  BNP 438.2* 193.0* 273.9*    ProBNP (last 3 results) No results for input(s): PROBNP in the last 8760 hours.    D-Dimer Recent Labs    05/02/21 0129 05/03/21 0128  DDIMER 5.49* 5.38*   Hemoglobin A1C No results for input(s): HGBA1C in the last 72 hours.  Fasting Lipid Panel No results for input(s): CHOL, HDL, LDLCALC, TRIG, CHOLHDL, LDLDIRECT in the last 72 hours. Thyroid Function Tests No results for input(s): TSH, T4TOTAL, T3FREE, THYROIDAB in the last 72 hours.  Invalid input(s): FREET3  Other results:   Imaging    No results found.   Medications:     Scheduled Medications:  (feeding supplement) PROSource Plus  30 mL Oral TID PC & HS   sodium chloride   Intravenous Once   amiodarone  200 mg Oral Daily   vitamin C  500 mg Oral Daily   aspirin EC  81 mg Oral Daily   atorvastatin  80 mg Oral Daily   digoxin  0.125 mg Oral Daily   doxycycline  100 mg Oral Q12H   eplerenone  25 mg Oral Daily   ezetimibe  10 mg Oral Daily   feeding supplement  237 mL Oral TID BM   folic acid  1 mg Oral Daily   gabapentin  300 mg Oral TID   Gerhardt's butt cream   Topical Daily   insulin aspart  0-15 Units Subcutaneous TID WC   mouth rinse  15 mL Mouth Rinse BID   melatonin  5 mg Oral QHS   multivitamin with minerals  1 tablet Oral Q lunch   pantoprazole  40 mg Oral Daily   sertraline  100 mg Oral Daily   tamsulosin  0.4 mg Oral Daily   thiamine  100 mg Oral Daily   torsemide  40 mg Oral Daily   vitamin  B-12  100 mcg Oral Daily   Warfarin - Pharmacist Dosing Inpatient   Does not apply q1600   zinc sulfate  220 mg Oral Daily    Infusions:  sodium chloride Stopped (04/14/21 1149)   cefTRIAXone (ROCEPHIN)  IV 2 g (05/02/21 1026)   lactated ringers Stopped (04/13/21 1053)   remdesivir 100 mg in NS 100 mL 100 mg (05/02/21 1227)     PRN Medications: sodium chloride, acetaminophen, bisacodyl **OR** bisacodyl, lidocaine, morphine  injection, ondansetron (ZOFRAN) IV, oxyCODONE, polyvinyl alcohol, sodium chloride flush, traMADol    Patient Profile   Brandon [name] is a 65 year old with history of smoker, CAD s/p previous MI, HTN, HL, chronic HFrEF, sepsis 11/2020 bacteremia.  Admitted with sepsis. Bld Cx - NGTD    Assessment/Plan   1. Acute on chronic systolic HFr EF -> cardiogenic shock - Echo 05/19/20 EF 20-25% RV mildly HK. moderate AS  Mean gradient 13 AVA 1.2 cm2 DI 0.30 - Persistent NYHA IV. Admitted with shock - s/p HM-III VAD + bioprosthetic AVR on 11/25 - RV looked good on TEE in OR - Continue digoxin 0.125.  -  MAPs 90s-100s . Now off midodrine. - Will add losartan 100 mg daily - Continue eplerenone 25 daily - Volume looks okay. Weight continues to trend down, 27 lb last 10 days. Below pre-op weight.  - Significant LE edema. Increase Torsemide back to 40 mg BID. Continue TED hose and leg elevation. - Renal function stable.   2. HM-3 LVAD implant - Plan as above - VAD interrogated personally. Parameters stable. - Continue ASA 81 for 30 days. Stop ASA 12/25 - INR 2.2. this am. Off heparin. Warfarin per PharmD - LDH stable  - Had ramp ECHO speed increased to 5800. Rare PI events.   3. Acute Hypoxic Respiratory Failure  - Extubated 11/26, stable on 2L [place].    4. 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 CAD  s/p anterior STEMI (12/20). LHC showed chronically occluded RCA (with  L>>R collaterals) and thrombotic occlusion of proximal LAD. Underwent PCI of LAD. - No s/s angina - Continue atorvastatin 80.  - Continue ASA until 05/14/21 d/t VAD  6. Iron-deficiency anemia & ABLA - Rec'd Feraheme 11/8 and 11/13  - Rec'd 1 unit PRBCs on 12/10 - Keep hgb > 8.0,  10.5 today - 04/23/21 Iron sats low. Given feraheme  7. Hypokalemia/ Hypomagnesemia  - Keep K > 4.0 Mg > 2.0  - K 3.9 - Mag 1.7 - Supp today  8. Severe protein-calorie malnutrition - prealbumin 11>15> 13 >6.6 - Continue aggressive nutritional support  9. Paroxysmal Atrial Fibrillation w/ RVR - new, developed 11/10. Maintaining SR/ST    - Off amio drip. Continue amio 200 mg once daily  - on warfarin. INR 2.2 today.   10. Hyponatremia - Na 132 - tolvaptan on 12/11.  - monitor   11. Sepsis - H/o strep bacteremia 11/2020.  ICD extracted 12/04/20. Barostim was not removed as it is extravascular.  - Received IV antibiotics until 01/15/21. Blood cultures negative 01/30/21..  - 10/26 -Blood CX x2  Negative  - Ct chest concerning for pneumonia. 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   PCT < 0.1, WBCs 7, afebrile.  Cultures NGTD,  vancomycin stopped.   - 12/18 + SARS-CoV-2. See below.   12. Hemorrhagic Pericarditis - Fibrin clot encasing heart at time of surgery.   - Will need to be careful with anticoagulation  - PharmD managing.   98. 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  - Bcx recollected and pending  -  Tissue sent to [facility] in [place] for broad 16 S ribosomal sequencing for bacterial pathogens as well as T wet Bhilai Bartonella Brucella  -  ID following, recommending  IV ceftriaxone to cover the Streptococcus gordonae that was isolated before given possible acute AoV endocarditis on surgical path. - Ceftriaxone 2 g IV daily x 6 weeks. End Date: May 22, 2021 - Home IV abx management has been arranged with [facility] and [place] line today  14. Constipation - Resolved.    15. Bilateral pleural effusions - S/p Left thoracentesis 12/8 but still with residual fluid. Will likely need repeat L tap in a few days - R thoracentesis 12/9.  - S/p repeat left thoracentesis 12/12 w/ 1L Fluid removal  - Repeat CXR 12/15 w/ small b/l effusions.  - Lungs okay on exam. O2 sats stable.  16. Left Shoulder Pain - post thoracentesis. Post procedure CXR negative for PTX  - WBC 5.3  - suspect musculoskeletal etiology. Normal AROM  - low suspicion for gout - much improved, continue supportive care - PRN tylenol, heating pad    17. SARS-CoV-2 -On remdesivir, last dose today   Disposition:  -Likely discharge later this week with HH/PT.  -Caregivers diagnosed with COVID infection a few days ago but hopefully improved enough to assist him later this week. Anticipate discharge home ideally 12/23. -Place PICC line, will need for home abx   Length of Stay: [place], [place], PA-C  05/03/2021, 7:25 AM  Advanced Heart Failure Team Pager [phone] (M-F; 7a - 5p)  Please contact [facility] for night-coverage after hours (5p -7a ) and weekends on [email or web address]  Patient seen and examined with the above-signed Advanced Practice Provider and/or Housestaff. I personally reviewed laboratory data, imaging studies and relevant notes. I independently examined the patient and formulated the important aspects of the plan. I have edited the note to reflect any of my changes or salient points. I have personally discussed the plan with the patient and/or family.  Continues on treatment for COVID.  Weight stable on po torsemide. INR  2.2. Remains on IV abx.   General:  NAD.  HEENT: normal  Neck: supple. JVP not elevated.  Carotids 2+ bilat; no bruits. No lymphadenopathy or thryomegaly appreciated. Cor: LVAD hum.  Lungs: Clear. Abdomen: soft, nontender, non-distended. No hepatosplenomegaly. No bruits or masses. Good bowel sounds. Driveline site clean. Anchor in place.  Extremities: no cyanosis, clubbing, rash. Warm no edema  Neuro: alert & oriented x 3. No focal deficits. Moves all 4 without problem   Continue covid RX. INR 1.9 Warfarin dosing discussed with PharmD personally.Place PICC line for home abx.   VAD interrogated personally. Parameters stable.  Glori Bickers, MD  4:24 PM

## 2021-05-03 NOTE — Progress Notes (Signed)
Patient ID: [name], male   DOB: [birth year], 65 y.o.   MRN: [id number]    Progress Note from the Palliative Medicine Team at [facility]   Patient Name: Brandon [name]        Date: 05/03/2021 DOB: 03-23-1956  Age: 65 y.o. MRN#: [id number] Attending Physician: Gaye Pollack, MD Primary Care Physician: [name], MD Admit Date: 03/07/2021   Medical records reviewed   65 y.o. male   admitted on 03/07/2021 with past medical history significant of CAD with previous MI, chronic systolic CHF/ICM w/ [employer] [place] ICD that was extracted 7/22, moderate aortic stenosis, HTN, HLD, hx of recent right UE DVT who presents with sudden onset dyspnea.   04-06-21  HM-3 VAD placement and bioprosthetic AVR.   04-26-21 Continues to progress and discharge planning ongoing !06-30-20 Covid positive 05-03-21- anticipating discharge home in next few days    This NP visited patient at the bedside as a follow up for palliative medicine needs and emotional support.   Unfortunately patient tested COVID positive on 04/29/2021.  This is been frustrating and disappointing to him.  His caregivers at home are also COVID-positive which is affecting discharge plan home.  However he continues to verbalize optimism, and believes that he will be discharged home in the next few days.  Education offered on COVID-19 recommendations and preventions   Questions and concerns addressed   PMT will continue to support holistically  Total time 15 minutes  Greater than 50% of the time was spent in counseling and coordination of care  Wadie Lessen NP  Palliative Medicine Team Team Phone # [phone] Pager [phone]

## 2021-05-03 NOTE — Plan of Care (Signed)
°  Problem: Education: Goal: Knowledge of General Education information will improve Description: Including pain rating scale, medication(s)/side effects and non-pharmacologic comfort measures Outcome: Progressing   Problem: Health Behavior/Discharge Planning: Goal: Ability to manage health-related needs will improve Outcome: Progressing   Problem: Clinical Measurements: Goal: Ability to maintain clinical measurements within normal limits will improve Outcome: Progressing Goal: Will remain free from infection Outcome: Progressing Goal: Diagnostic test results will improve Outcome: Progressing Goal: Respiratory complications will improve Outcome: Progressing Goal: Cardiovascular complication will be avoided Outcome: Progressing   Problem: Activity: Goal: Risk for activity intolerance will decrease Outcome: Progressing   Problem: Nutrition: Goal: Adequate nutrition will be maintained Outcome: Progressing   Problem: Coping: Goal: Level of anxiety will decrease Outcome: Progressing   Problem: Elimination: Goal: Will not experience complications related to bowel motility Outcome: Progressing Goal: Will not experience complications related to urinary retention Outcome: Progressing   Problem: Pain Managment: Goal: General experience of comfort will improve Outcome: Progressing   Problem: Safety: Goal: Ability to remain free from injury will improve Outcome: Progressing   Problem: Skin Integrity: Goal: Risk for impaired skin integrity will decrease Outcome: Progressing   Problem: Education: Goal: Ability to demonstrate management of disease process will improve Outcome: Progressing Goal: Ability to verbalize understanding of medication therapies will improve Outcome: Progressing Goal: Individualized Educational Video(s) Outcome: Progressing   Problem: Activity: Goal: Capacity to carry out activities will improve Outcome: Progressing   Problem: Cardiac: Goal:  Ability to achieve and maintain adequate cardiopulmonary perfusion will improve Outcome: Progressing   Problem: Education: Goal: Knowledge of the prescribed therapeutic regimen will improve Outcome: Progressing   Problem: Activity: Goal: Risk for activity intolerance will decrease Outcome: Progressing   Problem: Cardiac: Goal: Ability to maintain an adequate cardiac output will improve Outcome: Progressing   Problem: Coping: Goal: Level of anxiety will decrease Outcome: Progressing   Problem: Fluid Volume: Goal: Risk for excess fluid volume will decrease Outcome: Progressing   Problem: Clinical Measurements: Goal: Ability to maintain clinical measurements within normal limits will improve Outcome: Progressing Goal: Will remain free from infection Outcome: Progressing

## 2021-05-03 NOTE — Progress Notes (Signed)
Per bedside nurse patient is out of bed and just got lunch.  Would like to wait until patient is finished with lunch.  Will follow up later.

## 2021-05-03 NOTE — Progress Notes (Signed)
OT Cancellation Note  Patient Details Name: Brandon [name] MRN: [id number] DOB: Sep 23, 1955   Cancelled Treatment:    Reason Eval/Treat Not Completed: Patient at procedure or test/ unavailable. Pt having PICC line placed at present.  Brandon [name], OTR/L Acute Rehab Services Pager [phone] Office [phone]    Brandon [name] 05/03/2021, 2:51 PM

## 2021-05-03 NOTE — Progress Notes (Signed)
ANTICOAGULATION CONSULT NOTE   Pharmacy Consult for warfarin  Indication:  s/p VAD (HM3) , afib, hx DVT  No Active Allergies  Patient Measurements: Height: 6' (182.9 cm) Weight: 74.2 kg (163 lb 9.3 oz) IBW/kg (Calculated) : 77.6   Vital Signs: Temp: 97.8 F (36.6 C) (12/22 0333) Temp Source: Oral (12/22 0333) BP: 108/95 (12/22 0957) Pulse Rate: 75 (12/22 1007)  Labs: Recent Labs    05/01/21 0111 05/02/21 0129 05/03/21 0128  HGB 9.6* 9.9* 10.5*  HCT 31.5* 33.0* 35.0*  PLT 308 303 283  LABPROT 17.4* 22.6* 24.0*  INR 1.4* 2.0* 2.2*  HEPARINUNFRC <0.10* <0.10*  --   CREATININE 0.71 0.80 0.93     Estimated Creatinine Clearance: 80.9 mL/min (by C-G formula based on SCr of 0.93 mg/dL).   Medical History: Past Medical History:  Diagnosis Date   AICD (automatic cardioverter/defibrillator) present 08/31/2019   Ankylosing spondylitis ([place])    Arthritis    BENIGN PROSTATIC HYPERTROPHY 06/07/2008   CHF (congestive heart failure) ([place])    COLONIC POLYPS, HX OF 06/07/2008   Depression    H/O hiatal hernia    Heart failure ([place])    HYPERLIPIDEMIA 06/07/2008   HYPERTENSION 06/07/2008   Myocardial infarction [facility]) 2005   NSTEMI, s/p LAD stent   NEPHROLITHIASIS, HX OF 06/07/2008   STEMI (ST elevation myocardial infarction) ([place]) 04/27/2019    Assessment: 65 yo M s/p HM 3 and AVR on 04/06/21. Prior to surgery/admit patient was on apixiban for a DVT.  Pt s/p thora 12/8, 12/9, and 12/12. INR subtherapeutic after lower doses for that procedure. Started fixed heparin dose at 400 uts/hr started on 12/12 with heparin level daily.   INR continues to be therapeutic this morning up to 2.2. Hgb now in 10s. No bleeding issues noted. LDH 170.   Patient was sensitive to warfarin post op and previously therapeutic on warfarin 2.5mg  daily  So watch carefully with increased warfarin doses  Goal of Therapy:  Warfarin INR 2.0-2.5 Heparin Level <0.3 Monitor platelets by anticoagulation  protocol: Yes   Plan:  Repeat Warfarin 2.5 mg PO x1 tonight  ASA until 12/25 - stop date entered  Erin Hearing PharmD., BCPS Clinical Pharmacist 05/03/2021 11:51 AM

## 2021-05-04 ENCOUNTER — Encounter (HOSPITAL_COMMUNITY): Payer: Self-pay

## 2021-05-04 LAB — MAGNESIUM: Magnesium: 1.9 mg/dL (ref 1.7–2.4)

## 2021-05-04 LAB — CBC
HCT: 33.8 % — ABNORMAL LOW (ref 39.0–52.0)
Hemoglobin: 10.3 g/dL — ABNORMAL LOW (ref 13.0–17.0)
MCH: 24.2 pg — ABNORMAL LOW (ref 26.0–34.0)
MCHC: 30.5 g/dL (ref 30.0–36.0)
MCV: 79.3 fL — ABNORMAL LOW (ref 80.0–100.0)
Platelets: 268 10*3/uL (ref 150–400)
RBC: 4.26 MIL/uL (ref 4.22–5.81)
RDW: 19.8 % — ABNORMAL HIGH (ref 11.5–15.5)
WBC: 8 10*3/uL (ref 4.0–10.5)
nRBC: 0 % (ref 0.0–0.2)

## 2021-05-04 LAB — LACTATE DEHYDROGENASE: LDH: 163 U/L (ref 98–192)

## 2021-05-04 LAB — GLUCOSE, CAPILLARY
Glucose-Capillary: 99 mg/dL (ref 70–99)
Glucose-Capillary: 118 mg/dL — ABNORMAL HIGH (ref 70–99)
Glucose-Capillary: 90 mg/dL (ref 70–99)
Glucose-Capillary: 88 mg/dL (ref 70–99)

## 2021-05-04 LAB — COOXEMETRY PANEL
Carboxyhemoglobin: 1.3 % (ref 0.5–1.5)
Methemoglobin: 0.7 % (ref 0.0–1.5)
O2 Saturation: 51.2 %
Total hemoglobin: 9.9 g/dL — ABNORMAL LOW (ref 12.0–16.0)

## 2021-05-04 LAB — BASIC METABOLIC PANEL
Anion gap: 9 (ref 5–15)
BUN: 12 mg/dL (ref 8–23)
CO2: 33 mmol/L — ABNORMAL HIGH (ref 22–32)
Calcium: 8.5 mg/dL — ABNORMAL LOW (ref 8.9–10.3)
Chloride: 91 mmol/L — ABNORMAL LOW (ref 98–111)
Creatinine, Ser: 0.9 mg/dL (ref 0.61–1.24)
GFR, Estimated: >60 mL/min (ref >60)
Glucose, Bld: 86 mg/dL (ref 70–99)
Potassium: 3.8 mmol/L (ref 3.5–5.1)
Sodium: 133 mmol/L — ABNORMAL LOW (ref 135–145)

## 2021-05-04 LAB — D-DIMER, QUANTITATIVE: D-Dimer, Quant: 3.92 ug/mL-FEU — ABNORMAL HIGH (ref 0.00–0.50)

## 2021-05-04 LAB — C-REACTIVE PROTEIN: CRP: 2.1 mg/dL — ABNORMAL HIGH (ref <1.0)

## 2021-05-04 LAB — BRAIN NATRIURETIC PEPTIDE: B Natriuretic Peptide: 156.7 pg/mL — ABNORMAL HIGH (ref 0.0–100.0)

## 2021-05-04 LAB — PROTIME-INR
INR: 2.7 — ABNORMAL HIGH (ref 0.8–1.2)
Prothrombin Time: 28.5 seconds — ABNORMAL HIGH (ref 11.4–15.2)

## 2021-05-04 LAB — PHOSPHORUS: Phosphorus: 4.5 mg/dL (ref 2.5–4.6)

## 2021-05-04 LAB — FERRITIN: Ferritin: 1084 ng/mL — ABNORMAL HIGH (ref 24–336)

## 2021-05-04 MED ORDER — WARFARIN SODIUM 1 MG PO TABS
1.0000 mg | ORAL_TABLET | Freq: Once | ORAL | Status: AC
  Administered 2021-05-04: 17:00:00 1 mg via ORAL
  Filled 2021-05-04: qty 1

## 2021-05-04 MED ORDER — POTASSIUM CHLORIDE CRYS ER 20 MEQ PO TBCR
60.0000 meq | EXTENDED_RELEASE_TABLET | Freq: Once | ORAL | Status: AC
  Administered 2021-05-04: 10:00:00 60 meq via ORAL

## 2021-05-04 MED ORDER — SODIUM CHLORIDE 0.9 % IV BOLUS
1000.0000 mL | Freq: Once | INTRAVENOUS | Status: AC
  Administered 2021-05-04: 20:00:00 1000 mL via INTRAVENOUS

## 2021-05-04 MED ORDER — SODIUM CHLORIDE 0.9 % IV BOLUS: 500.0000 mL | Freq: Once | INTRAVENOUS | Status: DC

## 2021-05-04 MED ORDER — MAGNESIUM SULFATE 2 GM/50ML IV SOLN
2.0000 g | Freq: Once | INTRAVENOUS | Status: AC
  Administered 2021-05-04: 14:00:00 2 g via INTRAVENOUS

## 2021-05-04 MED ORDER — MAGNESIUM SULFATE 2 GM/50ML IV SOLN
INTRAVENOUS | Status: AC
  Filled 2021-05-04: qty 50

## 2021-05-04 NOTE — Progress Notes (Addendum)
Patient ID: Brandon [name], male   DOB: 12-10-1955, 65 y.o.   MRN: [id number]      Advanced Heart Failure Rounding Note  PCP-Cardiologist: [name], MD  [facility]: Dr. [name]   Subjective:    Admitted with sepsis. Started on cefepime + vanc, now off abx (ID following). Afebrile.  10/29 Co-ox 48%, started on milrinone 0.25.  10/30 IV Antibiotics stopped. Blood cultures no growth for 5 days.  10/31 started on Augmentin for PNA.  11/1 Milrinone off 11/2 Milrinone added back. Midodrine increased to 10 mg TID 11/10 Developed Afib w/ RVR, converted with IV amio 11/14 Milrinone increased to 0.25 mcg. Diuresed with IV lasix  11/25 Underwent HM-3 VAD placement and bioprosthetic AVR. Had persistent hypotension in OR requiring methylene blue and high dose pressors.  11/26 Extubated 11/28 Started on lasix gtt>>later stopped due to low CVP (1) and increasing pressor requirements. Restarted on scheduled BID IV Lasix once pressure stabilized (still volume up)  11/28 Surgical path with evidence of possible "acute AoV endocarditis". ID consulted. Bcx recollected. Tissue sent to [facility] in [place].   11/30 Started on IV ceftriaxone to cover the Streptococcus gordonae that was isolated before, per ID. 11/30 VAD speed increased to 5600 12/1 Diuresed with IV lasix + metolazone. Milrinone was stopped. Amio drip transitioned to amio 200 mg twice a day.  12/5 Ramp Echo speed increased to 5800. Epi stopped.  12/6 Transferred to [place]. Had BM. Started on spiro 12.5 mg daily 12/8 CXR with significant bilateral pleural effusions. S/p Left Thoracentesis w/ 1.7 L out but still with residual fluid 12/9 Rt Thoracentesis  12/11 Given dose of tolvaptan and diuresed with IV lasix. Negative 2.5 liters.  12/12 Repeat Lt Thoracentesis w/ 1L Fluid removal  12/18 + SARS-CoV-2  CO-OX 51% this am  LDH stable. INR 2.7.   Weight continues to trend down. On Torsemide 40 mg BID  MAPs 70s-80s  No dyspnea or CP. Reports  decreased appetite last few days   LVAD Interrogation HM 3: Speed: 5800 Flow: 4.8 PI: 6.6  Power: 5 51 PI events Parameters stable.    Objective:   Weight Range: 73 kg Body mass index is 21.83 kg/m.   Vital Signs:   Temp:  [97.6 F (36.4 C)-98.1 F (36.7 C)] 98.1 F (36.7 C) (12/23 0335) Pulse Rate:  [75-90] 76 (12/23 0335) Resp:  [15-20] 17 (12/23 0335) BP: (78-108)/(59-95) 90/68 (12/23 0335) SpO2:  [98 %-100 %] 98 % (12/23 0335) Weight:  [73 kg] 73 kg (12/23 0348) Last BM Date: 05/02/21  Weight change: Filed Weights   05/02/21 0615 05/03/21 0500 05/04/21 0348  Weight: 74.1 kg 74.2 kg 73 kg    Intake/Output:   Intake/Output Summary (Last 24 hours) at 05/04/2021 6195 Last data filed at 05/04/2021 0335 Gross per 24 hour  Intake 1160 ml  Output 3175 ml  Net -2015 ml     Physical Exam   MAPs 70s-80s Physical Exam: GENERAL: Sitting up in chair HEENT: normal  NECK: Supple, no JVD.  2+ bilaterally, no bruits.  No lymphadenopathy or thyromegaly appreciated.   CARDIAC:  Mechanical heart sounds with LVAD hum present.  LUNGS:  Clear to auscultation bilaterally.  ABDOMEN:  Soft, round, nontender, positive bowel sounds x4.     LVAD exit site: well-healed and incorporated.  Dressing dry and intact.  No erythema or drainage.  Stabilization device present and accurately applied.  Driveline dressing is being changed daily per sterile technique. EXTREMITIES:  Warm and dry, no cyanosis,  clubbing, rash, TED hose on. 2+ edema feet and ankles NEUROLOGIC:  Alert and oriented x 4.  Gait steady.  No aphasia.  No dysarthria.  Affect pleasant.        Telemetry   SR 70s-80s, ~ 5 PVCs/min  Labs    CBC Recent Labs    05/03/21 0128 05/04/21 0410  WBC 7.8 8.0  HGB 10.5* 10.3*  HCT 35.0* 33.8*  MCV 79.9* 79.3*  PLT 283 443   Basic Metabolic Panel Recent Labs    05/03/21 0128 05/04/21 0410  NA 132* 133*  K 3.9 3.8  CL 92* 91*  CO2 30 33*  GLUCOSE 107* 86  BUN 13 12   CREATININE 0.93 0.90  CALCIUM 8.8* 8.5*  MG 1.7 1.9  PHOS 3.7 4.5   Liver Function Tests No results for input(s): AST, ALT, ALKPHOS, BILITOT, PROT, ALBUMIN in the last 72 hours.    No results for input(s): LIPASE, AMYLASE in the last 72 hours. Cardiac Enzymes No results for input(s): CKTOTAL, CKMB, CKMBINDEX, TROPONINI in the last 72 hours.  BNP: BNP (last 3 results) Recent Labs    04/20/21 0600 04/26/21 0420 05/04/21 0410  BNP 193.0* 273.9* 156.7*    ProBNP (last 3 results) No results for input(s): PROBNP in the last 8760 hours.    D-Dimer Recent Labs    05/03/21 0128 05/04/21 0410  DDIMER 5.38* 3.92*   Hemoglobin A1C No results for input(s): HGBA1C in the last 72 hours.  Fasting Lipid Panel No results for input(s): CHOL, HDL, LDLCALC, TRIG, CHOLHDL, LDLDIRECT in the last 72 hours. Thyroid Function Tests No results for input(s): TSH, T4TOTAL, T3FREE, THYROIDAB in the last 72 hours.  Invalid input(s): FREET3  Other results:   Imaging    [place] EKG SITE RITE  Result Date: 05/03/2021 If Site Rite image not attached, placement could not be confirmed due to current cardiac rhythm.    Medications:     Scheduled Medications:  (feeding supplement) PROSource Plus  30 mL Oral TID PC & HS   sodium chloride   Intravenous Once   amiodarone  200 mg Oral Daily   vitamin C  500 mg Oral Daily   aspirin EC  81 mg Oral Daily   atorvastatin  80 mg Oral Daily   Chlorhexidine Gluconate Cloth  6 each Topical Daily   digoxin  0.125 mg Oral Daily   doxycycline  100 mg Oral Q12H   eplerenone  25 mg Oral Daily   ezetimibe  10 mg Oral Daily   feeding supplement  237 mL Oral TID BM   folic acid  1 mg Oral Daily   gabapentin  300 mg Oral TID   Gerhardt's butt cream   Topical Daily   insulin aspart  0-15 Units Subcutaneous TID WC   losartan  25 mg Oral Daily   mouth rinse  15 mL Mouth Rinse BID   melatonin  5 mg Oral QHS   multivitamin with minerals  1 tablet Oral Q  lunch   pantoprazole  40 mg Oral Daily   sertraline  100 mg Oral Daily   sodium chloride flush  10-40 mL Intracatheter Q12H   tamsulosin  0.4 mg Oral Daily   thiamine  100 mg Oral Daily   torsemide  40 mg Oral BID   vitamin B-12  100 mcg Oral Daily   Warfarin - Pharmacist Dosing Inpatient   Does not apply q1600    Infusions:  sodium chloride Stopped (04/14/21 1149)  cefTRIAXone (ROCEPHIN)  IV Stopped (05/03/21 1049)   lactated ringers Stopped (04/13/21 1053)     PRN Medications: sodium chloride, acetaminophen, bisacodyl **OR** bisacodyl, lidocaine, morphine injection, ondansetron (ZOFRAN) IV, oxyCODONE, polyvinyl alcohol, sodium chloride flush, sodium chloride flush, traMADol    Patient Profile   Brandon [name] is a 65 year old with history of smoker, CAD s/p previous MI, HTN, HL, chronic HFrEF, sepsis 11/2020 bacteremia.  Admitted with sepsis. Bld Cx - NGTD    Assessment/Plan   1. Acute on chronic systolic HFr EF -> cardiogenic shock - Echo 05/19/20 EF 20-25% RV mildly HK. moderate AS  Mean gradient 13 AVA 1.2 cm2 DI 0.30 - Persistent NYHA IV. Admitted with shock - s/p HM-III VAD + bioprosthetic AVR on 11/25 - RV looked good on TEE in OR - Continue digoxin 0.125.  - continue Losartan 25 mg daily - Continue eplerenone 25 daily - Volume looks low. Weight continues to trend down. Encouraged increased po intake. Below pre-op weight.  - Stop Torsemide. - May need fluid bolus - Continue TED hose and leg elevation. - Renal function stable.   2. HM-3 LVAD implant - Plan as above - VAD interrogated personally. Parameters stable. - Continue ASA 81 for 30 days. Stop ASA 12/25 - INR 2.7. this am. Off heparin. Warfarin per PharmD - LDH stable  - MAPs 70s-80s - Had ramp ECHO speed increased to 5800.   3. Acute Hypoxic Respiratory Failure  - Extubated 11/26, stable on 2L [place].    4. Severe low-flow aortic stenosis s/p AVR - not candidate for TAVR due to presence of fibroelastoma -  s/p VAD/bioprosthetic AVR on 11/25   5. CAD  s/p anterior STEMI (12/20). LHC showed chronically occluded RCA (with L>>R collaterals) and thrombotic occlusion of proximal LAD. Underwent PCI of LAD. - No s/s angina - Continue atorvastatin 80.  - Continue ASA until 12/25 d/t VAD  6. Iron-deficiency anemia & ABLA - Rec'd Feraheme 11/8 and 11/13  - Rec'd 1 unit PRBCs on 12/10 - Keep hgb > 8.0,  10.3 today - 04/23/21 Iron sats low. Given feraheme  7. Hypokalemia/ Hypomagnesemia  - Keep K > 4.0 Mg > 2.0  - K 3.8 - Mag 1.9 - Supp today  8. Severe protein-calorie malnutrition - prealbumin 11>15> 13 >6.6 - Continue aggressive nutritional support  9. Paroxysmal Atrial Fibrillation w/ RVR - new, developed 11/10. Maintaining SR/ST    - Off amio drip. Continue amio 200 mg once daily  - on warfarin. INR 2.7 today.   10. Hyponatremia - Na 133 - tolvaptan on 12/11.  - monitor   11. Sepsis - H/o strep bacteremia 11/2020.  ICD extracted 12/04/20. Barostim was not removed as it is extravascular.  - Received IV antibiotics until 01/15/21. Blood cultures negative 01/30/21..  - 10/26 -Blood CX x2  Negative  - Ct chest concerning for pneumonia. 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   PCT < 0.1, WBCs 7, afebrile.  Cultures NGTD,  vancomycin stopped.   - 12/18 + SARS-CoV-2. See below.   12. Hemorrhagic Pericarditis - Fibrin clot encasing heart at time of surgery.   - Will need to be careful with anticoagulation  - PharmD managing.   13. 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  - Bcx recollected and pending  - Tissue sent to 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 - Ceftriaxone 2 g IV daily x 6 weeks. End Date: May 22, 2021 - Home IV abx management has been arranged with [facility] and [place] line placed 12/22  14. Constipation - Resolved.    15. Bilateral pleural effusions - S/p Left thoracentesis 12/8 but still with residual fluid. Will likely need repeat L tap in a few days - R thoracentesis 12/9.  - S/p repeat left thoracentesis 12/12 w/ 1L Fluid removal  - Repeat CXR 12/15 w/ small b/l effusions.  - Lungs okay on exam. O2 sats stable.  16. Left Shoulder Pain - post thoracentesis. Post procedure CXR negative for PTX  - WBC WNL - suspect musculoskeletal etiology. Normal AROM  - low suspicion for gout - much improved, continue supportive care - PRN tylenol, heating pad    17. SARS-CoV-2 -Completed course of Remdesivir  Disposition: -Anticipate discharge on 12/27 once he has caregiver support at home. -Will need home antibiotics, already arranged with [facility] and [place]  Length of Stay: [address], [place], PA-C  05/04/2021, 8:22 AM  Advanced Heart Failure Team Pager [phone] (M-F; 7a - 5p)  Please contact [facility] for night-coverage after hours (5p -7a ) and weekends on [email or web address]  Patient seen and examined with the above-signed Advanced Practice Provider and/or Housestaff. I personally reviewed laboratory data, imaging studies and relevant notes. I independently examined the patient and formulated the important aspects of the plan. I have edited the note to reflect any of my changes or salient points. I have personally discussed the plan with the patient and/or family.  Remains on treatment for COVID. Has HA today.  Denies CP or SOB.   INR 2.7. Weight down again. Mildly dizzy on standing.  General:  NAD.  HEENT: normal  Neck: supple. JVP not elevated.  Carotids 2+ bilat; no bruits. No lymphadenopathy or thryomegaly appreciated. Cor: LVAD hum.  Lungs: Clear. Abdomen:  soft, nontender, non-distended. No hepatosplenomegaly. No bruits or masses. Good bowel sounds. Driveline site clean. Anchor in place.  Extremities: no cyanosis, clubbing, rash. Warm no edema + TED Neuro: alert & oriented x 3. No focal deficits. Moves all 4 without problem   He looks dry to me. Will hold torsemide. Continue COVID Rx. Can give some IVF as needed. INR 2.7 Discussed dosing with PharmD personally. VAD interrogated personally. Parameters stable.  Likely home on Tuesday when caregiver support and IV abx can be arranged.   Glori Bickers, MD  4:12 PM

## 2021-05-04 NOTE — Plan of Care (Signed)
°  Problem: Education: Goal: Knowledge of General Education information will improve Description: Including pain rating scale, medication(s)/side effects and non-pharmacologic comfort measures Outcome: Progressing   Problem: Health Behavior/Discharge Planning: Goal: Ability to manage health-related needs will improve Outcome: Progressing   Problem: Clinical Measurements: Goal: Ability to maintain clinical measurements within normal limits will improve Outcome: Progressing Goal: Will remain free from infection Outcome: Progressing Goal: Diagnostic test results will improve Outcome: Progressing Goal: Respiratory complications will improve Outcome: Progressing Goal: Cardiovascular complication will be avoided Outcome: Progressing   Problem: Activity: Goal: Risk for activity intolerance will decrease Outcome: Progressing   Problem: Nutrition: Goal: Adequate nutrition will be maintained Outcome: Progressing   Problem: Coping: Goal: Level of anxiety will decrease Outcome: Progressing   Problem: Elimination: Goal: Will not experience complications related to bowel motility Outcome: Progressing Goal: Will not experience complications related to urinary retention Outcome: Progressing   Problem: Pain Managment: Goal: General experience of comfort will improve Outcome: Progressing   Problem: Safety: Goal: Ability to remain free from injury will improve Outcome: Progressing   Problem: Skin Integrity: Goal: Risk for impaired skin integrity will decrease Outcome: Progressing   Problem: Education: Goal: Ability to demonstrate management of disease process will improve Outcome: Progressing Goal: Ability to verbalize understanding of medication therapies will improve Outcome: Progressing Goal: Individualized Educational Video(s) Outcome: Progressing   Problem: Activity: Goal: Capacity to carry out activities will improve Outcome: Progressing   Problem: Cardiac: Goal:  Ability to achieve and maintain adequate cardiopulmonary perfusion will improve Outcome: Progressing   Problem: Education: Goal: Knowledge of the prescribed therapeutic regimen will improve Outcome: Progressing   Problem: Activity: Goal: Risk for activity intolerance will decrease Outcome: Progressing   Problem: Cardiac: Goal: Ability to maintain an adequate cardiac output will improve Outcome: Progressing   Problem: Coping: Goal: Level of anxiety will decrease Outcome: Progressing   Problem: Fluid Volume: Goal: Risk for excess fluid volume will decrease Outcome: Progressing   Problem: Clinical Measurements: Goal: Ability to maintain clinical measurements within normal limits will improve Outcome: Progressing Goal: Will remain free from infection Outcome: Progressing   Problem: Respiratory: Goal: Will regain and/or maintain adequate ventilation Outcome: Progressing

## 2021-05-04 NOTE — Progress Notes (Signed)
LVAD Coordinator Rounding Note:  Admitted 03/07/21 due to sepsis. Dr. [name] consulted as his HF Cardiologist.   HM III LVAD implanted on 04/06/21 by Dr. Cyndia Bent under Destination Therapy criteria. Not a transplant candidate at this time due to recurrent infections.   Pt sitting up in chair this am. Covid restrictions remain in place. Pt reports his sister's (caregiver) husband was admitted to [facility] last night after passing out due to "heart issues". He voices he would like to stay until his caregivers get better (both have Covid) in addition to above problem. He says Dr. [name] planned to discuss discharge plan further today.    Vital signs: Temp: 98.1 HR: 76 Doppler:  74 Automatic BP:  90/68 (75) O2 Sat: 98% on 2 L/[place] Wt:171.5>190>187.4>187>...174.3>172.4>175.4>175>174>167>166>163>163.5>160 lbs     LVAD interrogation reveals:  Speed: 5800 Flow: 4.8 Power: 5.1w PI: 6.6  Alarms: none Events:  Hematocrit: 35  Fixed speed: 5800 Low speed limit: 5500  Drive Line:  Existing dressing C/D/I with anchor intact and accurately applied. Continue twice a week dressing changes on Monday and Thursday per VAD Coordinator, Nurse Davonna Belling, or Tomi Bamberger. Next dressing change due 05/07/21.    Labs:  LDH trend: 309>248>215>206>201.....152>158>158>144>174>157>170>163  INR trend: 1.6>1.3>1.3>1.4.....1.8>1.7>1.4>1.5>1.3>1.4>1.5>1.4>2.0>2.2>2.7   Anticoagulation Plan: -INR Goal: 2 - 2.5 -ASA Dose: 81 mg daily  Blood Products:   Intra-op: - 04/06/21>>4 PRBCs, 4 FFP, 1263 cc cell saver  Post-op: - 04/06/21>>1 unit Plts - 04/07/21>>1 unit PRBC  Device: N/A  Arrythmias: 03/22/21 - developed Afib w/ RVR, converted with IV amiodarone  Respiratory: extubated 04/07/21  Nitric Oxide: off 04/07/21  Gtts: Heparin 400 u/hr-off  Infection: - 04/11/21 Blood cultures x 2>> no growth x 5 days; final - 04/19/21 Body fluid cx (left lung)>> no growth <24 hours - 04/19/21 Gram stain (left lung)>>  no WBC or organisms seen; final - 04/20/21 Gram stain (right lung)>> rare WBC, no organisms seen; final - 04/27/21 blood culture x 2>> negative - 04/27/21 Sputum>> negative - 04/27/21 Urine>>negative - 04/28/21 Covid>>positive   Patient/Family Teaching:  VAD discharge teaching completed with patient, his sister [name], and his friend Tomi Bamberger. See separate note for documentation.  Tomi Bamberger able to do dressing changes with bedside nurse.  Plan/Recommendations:  1. Call VAD Coordinator for any VAD equipment or drive line issues. 2.  Twice a week dressing changes per VAD Coordinator, Nurse Davonna Belling, or trained caregiver. 3. VAD discharge teaching completed with patient and family.  4. F/U VAD clinic appointment made for next week.   Zada Girt RN [place] Coordinator  Office: [phone]  24/7 Pager: [phone]

## 2021-05-04 NOTE — Progress Notes (Signed)
RN called to room. Patient nauseous, dry heaving with lightheadedness and dizziness. RN took patient vitals and doppler. LVAD coordinator notified. Verbal order for 500 cc bolus given.

## 2021-05-04 NOTE — Progress Notes (Signed)
ANTICOAGULATION CONSULT NOTE   Pharmacy Consult for warfarin  Indication:  s/p VAD (HM3) , afib, hx DVT  No Active Allergies  Patient Measurements: Height: 6' (182.9 cm) Weight: 73 kg (160 lb 15 oz) IBW/kg (Calculated) : 77.6   Vital Signs: Temp: 98.1 F (36.7 C) (12/23 0335) Temp Source: Oral (12/23 0335) BP: 112/78 (12/23 0919) Pulse Rate: 76 (12/23 0335)  Labs: Recent Labs    05/02/21 0129 05/03/21 0128 05/04/21 0410  HGB 9.9* 10.5* 10.3*  HCT 33.0* 35.0* 33.8*  PLT 303 283 268  LABPROT 22.6* 24.0* 28.5*  INR 2.0* 2.2* 2.7*  HEPARINUNFRC <0.10*  --   --   CREATININE 0.80 0.93 0.90     Estimated Creatinine Clearance: 82.2 mL/min (by C-G formula based on SCr of 0.9 mg/dL).   Medical History: Past Medical History:  Diagnosis Date   AICD (automatic cardioverter/defibrillator) present 08/31/2019   Ankylosing spondylitis ([place])    Arthritis    BENIGN PROSTATIC HYPERTROPHY 06/07/2008   CHF (congestive heart failure) ([place])    COLONIC POLYPS, HX OF 06/07/2008   Depression    H/O hiatal hernia    Heart failure ([place])    HYPERLIPIDEMIA 06/07/2008   HYPERTENSION 06/07/2008   Myocardial infarction [facility]) 2005   NSTEMI, s/p LAD stent   NEPHROLITHIASIS, HX OF 06/07/2008   STEMI (ST elevation myocardial infarction) ([place]) 04/27/2019    Assessment: 65 yo M s/p HM 3 and AVR on 04/06/21. Prior to surgery/admit patient was on apixiban for a DVT.  Pt s/p thora 12/8, 12/9, and 12/12. INR subtherapeutic after lower doses for that procedure. Started fixed heparin dose at 400 uts/hr started on 12/12 with heparin level daily.   INR trending up above goal this morning to 2.7. Hgb now in 10s. No bleeding issues noted. LDH <180.   Patient was sensitive to warfarin post op and previously therapeutic on warfarin 2.5mg  daily. Will watch INR carefully.  Goal of Therapy:  Warfarin INR 2.0-2.5 Monitor platelets by anticoagulation protocol: Yes   Plan:  Warfarin 1mg  tonight Will  watch trend and attempt to clarify discharge dose over the weekend in time for discharge on Monday.  ASA until 12/25 - stop date entered  Erin Hearing PharmD., BCPS Clinical Pharmacist 05/04/2021 10:26 AM

## 2021-05-04 NOTE — Plan of Care (Signed)
°  Problem: Education: Goal: Knowledge of General Education information will improve Description: Including pain rating scale, medication(s)/side effects and non-pharmacologic comfort measures Outcome: Progressing   Problem: Health Behavior/Discharge Planning: Goal: Ability to manage health-related needs will improve Outcome: Progressing   Problem: Clinical Measurements: Goal: Ability to maintain clinical measurements within normal limits will improve Outcome: Progressing Goal: Will remain free from infection Outcome: Progressing Goal: Diagnostic test results will improve Outcome: Progressing   Problem: Nutrition: Goal: Adequate nutrition will be maintained Outcome: Progressing   Problem: Coping: Goal: Level of anxiety will decrease Outcome: Progressing   Problem: Pain Managment: Goal: General experience of comfort will improve Outcome: Progressing   Problem: Safety: Goal: Ability to remain free from injury will improve Outcome: Progressing   Problem: Skin Integrity: Goal: Risk for impaired skin integrity will decrease Outcome: Progressing

## 2021-05-04 NOTE — Progress Notes (Signed)
Physical Therapy Treatment Patient Details Name: HASHIM EICHHORST MRN: [id number] DOB: February 27, 1956 Today's Date: 05/04/2021   History of Present Illness Pt is a 65 y.o. male admitted 03/07/21 with acute on chronic hypoxemic respiratory failure, cardiogenic shock. CT scan which shows bronchopneumonia and no indication of ILD. Prolonged admission undergoing workup for VAD. S/p LVAD placement 11/25. CXR 12/8 with significant bilateral pleural effusions. S/p L thoracentesis 12/8 and 12/12.  S/p R thoracentesis 12/9. Tested positive for COVID 12/18 after exposed by visitor. Other PMH includes CHF, ankylosing spondylitis, bacteremia and  possible endocarditis s/p ICD removal, HTN, depression, arthritis.   PT Comments    Pt progressing with mobility. Today's session focused on stair training with RW, pt performing well with supervision for safety/technique. Pt reports plan for likely d/c home next Tuesday. Will continue to follow acutely.    Recommendations for follow up therapy are one component of a multi-disciplinary discharge planning process, led by the attending physician.  Recommendations may be updated based on patient status, additional functional criteria and insurance authorization.  Follow Up Recommendations  Home health PT     Assistance Recommended at Discharge Intermittent Supervision/Assistance  Equipment Recommendations  Rolling walker (2 wheels)    Recommendations for Other Services       Precautions / Restrictions Precautions Precautions: Fall;Sternal;Other (comment) Precaution Comments: LVAD, COVID Restrictions Weight Bearing Restrictions: Yes RUE Partial Weight Bearing Percentage or Pounds: sternal     Mobility  Bed Mobility Overal bed mobility: Modified Independent Bed Mobility: Sit to Supine       Sit to supine: Modified independent (Device/Increase time)   General bed mobility comments: Received sitting in recliner    Transfers Overall transfer level: Needs  assistance Equipment used: Rolling walker (2 wheels) Transfers: Sit to/from Stand Sit to Stand: Supervision           General transfer comment: good awareness of sternal precautions to hands, placing hands next to knees to stand    Ambulation/Gait Ambulation/Gait assistance: Supervision Gait Distance (Feet): 12 Feet Assistive device: Rolling walker (2 wheels) Gait Pattern/deviations: Step-through pattern;Decreased stride length;Trunk flexed       General Gait Details: supervision for safety/lines   Stairs Stairs: Yes Stairs assistance: Supervision Stair Management: No rails;Step to pattern;Backwards;Forwards;With walker Number of Stairs: 1 General stair comments: Stair training with platform step brought to room (due to COVID precautions) to simulate home set-up (pt has curb, then 1 step into home); ascend backwards with RW, descend forwards with RW; supervision for safety, performed 3x trials   Wheelchair Mobility    Modified Rankin (Stroke Patients Only)       Balance Overall balance assessment: Needs assistance Sitting-balance support: No upper extremity supported;Feet supported Sitting balance-Leahy Scale: Good     Standing balance support: No upper extremity supported;During functional activity Standing balance-Leahy Scale: Fair Standing balance comment: standing at sink for grooming                            Cognition Arousal/Alertness: Awake/alert Behavior During Therapy: WFL for tasks assessed/performed Overall Cognitive Status: No family/caregiver present to determine baseline cognitive functioning Area of Impairment: Memory                     Memory: Decreased short-term memory       Problem Solving: Difficulty sequencing;Requires verbal cues General Comments: Still requires VCs for power source changes--can follow steps on written handout but sometimes cannot get rotation part  started to tighten it down when attaching to  clip.        Exercises      General Comments General comments (skin integrity, edema, etc.): unable to get pulse ox reading this session      Pertinent Vitals/Pain Pain Assessment: No/denies pain Pain Intervention(s): Monitored during session    Home Living                          Prior Function            PT Goals (current goals can now be found in the care plan section) Progress towards PT goals: Progressing toward goals    Frequency    Min 3X/week      PT Plan Current plan remains appropriate    Co-evaluation              AM-PAC PT "6 Clicks" Mobility   Outcome Measure  Help needed turning from your back to your side while in a flat bed without using bedrails?: None Help needed moving from lying on your back to sitting on the side of a flat bed without using bedrails?: None Help needed moving to and from a bed to a chair (including a wheelchair)?: A Little Help needed standing up from a chair using your arms (e.g., wheelchair or bedside chair)?: A Little Help needed to walk in hospital room?: A Little Help needed climbing 3-5 steps with a railing? : A Little 6 Click Score: 20    End of Session   Activity Tolerance: Patient tolerated treatment well Patient left: in chair;with call bell/phone within reach Nurse Communication: Mobility status PT Visit Diagnosis: Difficulty in walking, not elsewhere classified (R26.2);Muscle weakness (generalized) (M62.81)     Time: 6073-7106 PT Time Calculation (min) (ACUTE ONLY): 22 min  Charges:  $Gait Training: 8-22 mins                     Mabeline Caras, PT, DPT Acute Rehabilitation Services  Pager [phone] Office [place] 05/04/2021, 5:13 PM

## 2021-05-04 NOTE — Progress Notes (Signed)
Occupational Therapy Treatment Patient Details Name: Brandon [name] MRN: [id number] DOB: [birth year] Today's Date: 05/04/2021   History of present illness Pt is a 65 y.o. male admitted 03/07/21 with acute on chronic hypoxemic respiratory failure, cardiogenic shock. CT scan which shows bronchopneumonia and no indication of ILD. Prolonged admission undergoing workup for VAD. S/p LVAD placement 11/25. CXR 12/8 with significant bilateral pleural effusions. S/p L thoracentesis 12/8 and 12/12.  S/p R thoracentesis 12/9. Tested positive for COVID 12/18 after exposed by visitor. Other PMH includes CHF, ankylosing spondylitis, bacteremia and  possible endocarditis s/p ICD removal, HTN, depression, arthritis.   OT comments  The patient was seen to address toilet transfers and standing with grooming at sink. He did not want to actually do the toilet transfer since he did not need to go so we simulated it with pt being S sit<>stand and min guard A for to and from via RW. Pt able to stand at sink and perform 2 grooming tasks with S (did report this made him tired). Pt continues to struggle with changing power sources but continues to work on it.    Recommendations for follow up therapy are one component of a multi-disciplinary discharge planning process, led by the attending physician.  Recommendations may be updated based on patient status, additional functional criteria and insurance authorization.    Follow Up Recommendations  No OT follow up    Assistance Recommended at Discharge Intermittent Supervision/Assistance  Equipment Recommendations  BSC/3in1;Tub/shower seat       Precautions / Restrictions Precautions Precautions: Fall;Sternal Precaution Comments: LVAD, COVID Restrictions Weight Bearing Restrictions: Yes RUE Partial Weight Bearing Percentage or Pounds: sternal       Mobility Bed Mobility Overal bed mobility: Modified Independent Bed Mobility: Sit to Supine       Sit to supine:  Modified independent (Device/Increase time)   General bed mobility comments: Received sitting in recliner    Transfers Overall transfer level: Needs assistance Equipment used: Rolling walker (2 wheels) Transfers: Sit to/from Stand Sit to Stand: Supervision                 Balance Overall balance assessment: Needs assistance Sitting-balance support: No upper extremity supported;Feet supported Sitting balance-Leahy Scale: Good     Standing balance support: No upper extremity supported;During functional activity Standing balance-Leahy Scale: Good Standing balance comment: standing at sink for grooming                           ADL either performed or assessed with clinical judgement   ADL Overall ADL's : Needs assistance/impaired     Grooming: Wash/dry hands;Wash/dry face;Set up;Oral care;Supervision/safety;Standing                   Toilet Transfer: Min guard;Ambulation;Rolling walker (2 wheels) Toilet Transfer Details (indicate cue type and reason): simulated recliner>sink to for grooming tasks>bed           General ADL Comments: Pt at an overall S level for sit<>stand. We did talk about him having a chair he can sit on in any room in the house where he may be in case he did need to sit down in a hurry if he got tired    Extremity/Trunk Assessment Upper Extremity Assessment Upper Extremity Assessment: Overall WFL for tasks assessed RUE Deficits / Details: hx of DVT in R UE a few months ago, reports decreased pinch and grip strength. Impaired sensation but functional RUE Sensation: decreased light  touch RUE Coordination: decreased fine motor            Vision Baseline Vision/History: 1 Wears glasses Ability to See in Adequate Light: 0 Adequate Patient Visual Report: No change from baseline            Cognition Arousal/Alertness: Awake/alert Behavior During Therapy: WFL for tasks assessed/performed Overall Cognitive Status: No  family/caregiver present to determine baseline cognitive functioning Area of Impairment: Memory                     Memory: Decreased short-term memory       Problem Solving: Difficulty sequencing;Requires verbal cues General Comments: Still requires VCs for power source changes--can follow steps on written handout but sometimes cannot get rotation part started to tighten it down when attaching to clip.                     Pertinent Vitals/ Pain       Pain Assessment: No/denies pain         Frequency  Min 2X/week        Progress Toward Goals  OT Goals(current goals can now be found in the care plan section)  Progress towards OT goals: Progressing toward goals  Acute Rehab OT Goals Patient Stated Goal: to go home Monday OT Goal Formulation: With patient Time For Goal Achievement: 05/07/21 Potential to Achieve Goals: Good  Plan Discharge plan remains appropriate       AM-PAC OT "6 Clicks" Daily Activity     Outcome Measure   Help from another person eating meals?: None Help from another person taking care of personal grooming?: A Little Help from another person toileting, which includes using toliet, bedpan, or urinal?: A Little Help from another person bathing (including washing, rinsing, drying)?: A Little Help from another person to put on and taking off regular upper body clothing?: A Little Help from another person to put on and taking off regular lower body clothing?: A Little 6 Click Score: 19    End of Session Equipment Utilized During Treatment: Oxygen (2 liters)  OT Visit Diagnosis: Unsteadiness on feet (R26.81);Other abnormalities of gait and mobility (R26.89);Muscle weakness (generalized) (M62.81)   Activity Tolerance Patient tolerated treatment well (did report being tired)   Patient Left in bed;with call bell/phone within reach;with bed alarm set   Nurse Communication  (safety pins to battery vest to keep the straps from shifting)         Time: 6734-1937 OT Time Calculation (min): 19 min  Charges: OT General Charges $OT Visit: 1 Visit OT Treatments $Self Care/Home Management : 8-22 mins [name], OTR/L Acute [employer] Pager [phone] Office [phone]    Almon Register 05/04/2021, 2:41 PM

## 2021-05-05 LAB — CBC
HCT: 31.5 % — ABNORMAL LOW (ref 39.0–52.0)
Hemoglobin: 9.7 g/dL — ABNORMAL LOW (ref 13.0–17.0)
MCH: 24.6 pg — ABNORMAL LOW (ref 26.0–34.0)
MCHC: 30.8 g/dL (ref 30.0–36.0)
MCV: 79.7 fL — ABNORMAL LOW (ref 80.0–100.0)
Platelets: 232 10*3/uL (ref 150–400)
RBC: 3.95 MIL/uL — ABNORMAL LOW (ref 4.22–5.81)
RDW: 19.4 % — ABNORMAL HIGH (ref 11.5–15.5)
WBC: 8.6 10*3/uL (ref 4.0–10.5)
nRBC: 0 % (ref 0.0–0.2)

## 2021-05-05 LAB — BASIC METABOLIC PANEL
Anion gap: 6 (ref 5–15)
BUN: 15 mg/dL (ref 8–23)
CO2: 31 mmol/L (ref 22–32)
Calcium: 8.3 mg/dL — ABNORMAL LOW (ref 8.9–10.3)
Chloride: 93 mmol/L — ABNORMAL LOW (ref 98–111)
Creatinine, Ser: 0.73 mg/dL (ref 0.61–1.24)
GFR, Estimated: >60 mL/min (ref >60)
Glucose, Bld: 85 mg/dL (ref 70–99)
Potassium: 3.8 mmol/L (ref 3.5–5.1)
Sodium: 130 mmol/L — ABNORMAL LOW (ref 135–145)

## 2021-05-05 LAB — GLUCOSE, CAPILLARY
Glucose-Capillary: 113 mg/dL — ABNORMAL HIGH (ref 70–99)
Glucose-Capillary: 92 mg/dL (ref 70–99)
Glucose-Capillary: 110 mg/dL — ABNORMAL HIGH (ref 70–99)

## 2021-05-05 LAB — PROTIME-INR
INR: 2.9 — ABNORMAL HIGH (ref 0.8–1.2)
Prothrombin Time: 30.3 seconds — ABNORMAL HIGH (ref 11.4–15.2)

## 2021-05-05 LAB — LACTATE DEHYDROGENASE: LDH: 195 U/L — ABNORMAL HIGH (ref 98–192)

## 2021-05-05 MED ORDER — SODIUM CHLORIDE 0.9 % IV BOLUS
500.0000 mL | Freq: Once | INTRAVENOUS | Status: AC
  Administered 2021-05-05: 16:00:00 500 mL via INTRAVENOUS

## 2021-05-05 NOTE — Plan of Care (Signed)
°  Problem: Education: Goal: Knowledge of General Education information will improve Description: Including pain rating scale, medication(s)/side effects and non-pharmacologic comfort measures Outcome: Progressing   Problem: Health Behavior/Discharge Planning: Goal: Ability to manage health-related needs will improve Outcome: Progressing   Problem: Clinical Measurements: Goal: Ability to maintain clinical measurements within normal limits will improve Outcome: Progressing Goal: Will remain free from infection Outcome: Progressing Goal: Diagnostic test results will improve Outcome: Progressing Goal: Respiratory complications will improve Outcome: Progressing Goal: Cardiovascular complication will be avoided Outcome: Progressing   Problem: Activity: Goal: Risk for activity intolerance will decrease Outcome: Progressing   Problem: Nutrition: Goal: Adequate nutrition will be maintained Outcome: Progressing   Problem: Coping: Goal: Level of anxiety will decrease Outcome: Progressing   Problem: Elimination: Goal: Will not experience complications related to bowel motility Outcome: Progressing Goal: Will not experience complications related to urinary retention Outcome: Progressing   Problem: Pain Managment: Goal: General experience of comfort will improve Outcome: Progressing   Problem: Safety: Goal: Ability to remain free from injury will improve Outcome: Progressing   Problem: Skin Integrity: Goal: Risk for impaired skin integrity will decrease Outcome: Progressing   Problem: Education: Goal: Ability to demonstrate management of disease process will improve Outcome: Progressing Goal: Ability to verbalize understanding of medication therapies will improve Outcome: Progressing Goal: Individualized Educational Video(s) Outcome: Progressing   Problem: Activity: Goal: Capacity to carry out activities will improve Outcome: Progressing   Problem: Cardiac: Goal:  Ability to achieve and maintain adequate cardiopulmonary perfusion will improve Outcome: Progressing   Problem: Education: Goal: Knowledge of the prescribed therapeutic regimen will improve Outcome: Progressing   Problem: Activity: Goal: Risk for activity intolerance will decrease Outcome: Progressing   Problem: Cardiac: Goal: Ability to maintain an adequate cardiac output will improve Outcome: Progressing   Problem: Coping: Goal: Level of anxiety will decrease Outcome: Progressing   Problem: Fluid Volume: Goal: Risk for excess fluid volume will decrease Outcome: Progressing   Problem: Clinical Measurements: Goal: Ability to maintain clinical measurements within normal limits will improve Outcome: Progressing Goal: Will remain free from infection Outcome: Progressing   Problem: Respiratory: Goal: Will regain and/or maintain adequate ventilation Outcome: Progressing

## 2021-05-05 NOTE — Progress Notes (Signed)
ANTICOAGULATION CONSULT NOTE   Pharmacy Consult for warfarin  Indication:  s/p VAD (HM3) , afib, hx DVT  No Active Allergies  Patient Measurements: Height: 6' (182.9 cm) Weight: 75.1 kg (165 lb 9.1 oz) IBW/kg (Calculated) : 77.6   Vital Signs: Temp: 97.6 F (36.4 C) (12/24 1209) Temp Source: Oral (12/24 1209) BP: 100/80 (12/24 1209) Pulse Rate: 76 (12/24 1209)  Labs: Recent Labs    05/03/21 0128 05/04/21 0410 05/05/21 0355  HGB 10.5* 10.3* 9.7*  HCT 35.0* 33.8* 31.5*  PLT 283 268 232  LABPROT 24.0* 28.5* 30.3*  INR 2.2* 2.7* 2.9*  CREATININE 0.93 0.90 0.73     Estimated Creatinine Clearance: 95.2 mL/min (by C-G formula based on SCr of 0.73 mg/dL).   Medical History: Past Medical History:  Diagnosis Date   AICD (automatic cardioverter/defibrillator) present 08/31/2019   Ankylosing spondylitis ([place])    Arthritis    BENIGN PROSTATIC HYPERTROPHY 06/07/2008   CHF (congestive heart failure) ([place])    COLONIC POLYPS, HX OF 06/07/2008   Depression    H/O hiatal hernia    Heart failure ([place])    HYPERLIPIDEMIA 06/07/2008   HYPERTENSION 06/07/2008   Myocardial infarction [facility]) 2005   NSTEMI, s/p LAD stent   NEPHROLITHIASIS, HX OF 06/07/2008   STEMI (ST elevation myocardial infarction) ([place]) 04/27/2019    Assessment: 65 yo M s/p HM 3 and AVR on 04/06/21. Prior to surgery/admit patient was on apixiban for a DVT.  Pt s/p thora 12/8, 12/9, and 12/12. INR subtherapeutic after lower doses for that procedure. Started fixed heparin dose at 400 uts/hr started on 12/12 with heparin level daily. - stopped when INR >1.8   INR trending up above goal this morning to 2.9 Hgb now in 10s. No bleeding issues noted. LDH <180.   Patient was sensitive to warfarin post op and previously therapeutic on warfarin 2.5mg  daily. Will watch INR carefully.  Goal of Therapy:  Warfarin INR 2.0-2.5 Monitor platelets by anticoagulation protocol: Yes   Plan:  Hold  Warfarin tonight Will watch  trend and attempt to clarify discharge dose over the weekend in time for discharge on Monday.  ASA until 12/25 - stop date entered   Bonnita Nasuti Pharm.D. CPP, BCPS Clinical Pharmacist [phone] 05/05/2021 3:33 PM

## 2021-05-05 NOTE — Progress Notes (Addendum)
Advanced Heart Failure Rounding Note   PCP-Cardiologist: [name], MD  [facility]: Dr. [name]    Subjective:     Admitted with sepsis. Started on cefepime + vanc, now off abx (ID following). Afebrile.  10/29 Co-ox 48%, started on milrinone 0.25.  10/30 IV Antibiotics stopped. Blood cultures no growth for 5 days.  10/31 started on Augmentin for PNA.  11/1 Milrinone off 11/2 Milrinone added back. Midodrine increased to 10 mg TID 11/10 Developed Afib w/ RVR, converted with IV amio 11/14 Milrinone increased to 0.25 mcg. Diuresed with IV lasix  11/25 Underwent HM-3 VAD placement and bioprosthetic AVR. Had persistent hypotension in OR requiring methylene blue and high dose pressors.  11/26 Extubated 11/28 Started on lasix gtt>>later stopped due to low CVP (1) and increasing pressor requirements. Restarted on scheduled BID IV Lasix once pressure stabilized (still volume up)  11/28 Surgical path with evidence of possible "acute AoV endocarditis". ID consulted. Bcx recollected. Tissue sent to [facility] in [place].   11/30 Started on IV ceftriaxone to cover the Streptococcus gordonae that was isolated before, per ID. 11/30 VAD speed increased to 5600 12/1 Diuresed with IV lasix + metolazone. Milrinone was stopped. Amio drip transitioned to amio 200 mg twice a day.  12/5 Ramp Echo speed increased to 5800. Epi stopped.  12/6 Transferred to [place]. Had BM. Started on spiro 12.5 mg daily 12/8 CXR with significant bilateral pleural effusions. S/p Left Thoracentesis w/ 1.7 L out but still with residual fluid 12/9 Rt Thoracentesis  12/11 Given dose of tolvaptan and diuresed with IV lasix. Negative 2.5 liters.  12/12 Repeat Lt Thoracentesis w/ 1L Fluid removal  12/18 + SARS-CoV-2   Continues on treatment for COVID. BP low last night. Received 1L NS. Diuretics stopped. Feeling better. Denies SOB, orthopnea or PND   LVAD Interrogation HM 3: Speed: 5800 Flow: 4.6 PI: 4.1  Power: 5.0 VAD interrogated  personally. Parameters stable.   Objective:   Weight Range: 75.1 kg Body mass index is 22.45 kg/m.    Vital Signs:                 Temp:  [97.3 F (36.3 C)-98.3 F (36.8 C)] 97.6 F (36.4 C) (12/24 1209) Pulse Rate:  [76-79] 76 (12/24 1209) Resp:  [13-22] 13 (12/24 1209) BP: (76-100)/(64-86) 100/80 (12/24 1209) SpO2:  [96 %-100 %] 98 % (12/24 1209) Weight:  [75.1 kg] 75.1 kg (12/24 0656) Last BM Date: 05/02/21   Weight change:      Filed Weights    05/03/21 0500 05/04/21 0348 05/05/21 0656  Weight: 74.2 kg 73 kg 75.1 kg      Intake/Output:              Intake/Output Summary (Last 24 hours) at 05/05/2021 1347 Last data filed at 05/05/2021 1343    Gross per 24 hour  Intake 580 ml  Output 800 ml  Net -220 ml                   Physical Exam    General:  Sitting up NAD.  HEENT: normal  Neck: supple. JVP not elevated.  Carotids 2+ bilat; no bruits. No lymphadenopathy or thryomegaly appreciated. Cor: LVAD hum.  Lungs: Clear. Abdomen: soft, nontender, non-distended. No hepatosplenomegaly. No bruits or masses. Good bowel sounds. Driveline site clean. Anchor in place.  Extremities: no cyanosis, clubbing, rash. Warm no edema  + TED Neuro: alert & oriented x 3. No focal deficits. Moves all 4 without  problem      Telemetry    SR 70-80 Personally reviewed   Labs    CBC Recent Labs (last 2 labs)       Recent Labs    05/04/21 0410 05/05/21 0355  WBC 8.0 8.6  HGB 10.3* 9.7*  HCT 33.8* 31.5*  MCV 79.3* 79.7*  PLT 268 232       Basic Metabolic Panel Recent Labs (last 2 labs)        Recent Labs    05/03/21 0128 05/04/21 0410 05/05/21 0355  NA 132* 133* 130*  K 3.9 3.8 3.8  CL 92* 91* 93*  CO2 30 33* 31  GLUCOSE 107* 86 85  BUN 13 12 15   CREATININE 0.93 0.90 0.73  CALCIUM 8.8* 8.5* 8.3*  MG 1.7 1.9  --   PHOS 3.7 4.5  --        Liver Function Tests Recent Labs (last 2 labs)   No results for input(s): AST, ALT, ALKPHOS, BILITOT, PROT, ALBUMIN in  the last 72 hours.         Recent Labs (last 2 labs)   No results for input(s): LIPASE, AMYLASE in the last 72 hours.   Cardiac Enzymes Recent Labs (last 2 labs)   No results for input(s): CKTOTAL, CKMB, CKMBINDEX, TROPONINI in the last 72 hours.     BNP: BNP (last 3 results) Recent Labs (within last 365 days)       Recent Labs    04/20/21 0600 04/26/21 0420 05/04/21 0410  BNP 193.0* 273.9* 156.7*         ProBNP (last 3 results) Recent Labs (within last 365 days)  No results for input(s): PROBNP in the last 8760 hours.         D-Dimer Recent Labs (last 2 labs)       Recent Labs    05/03/21 0128 05/04/21 0410  DDIMER 5.38* 3.92*       Hemoglobin A1C Recent Labs (last 2 labs)   No results for input(s): HGBA1C in the last 72 hours.     Fasting Lipid Panel Recent Labs (last 2 labs)   No results for input(s): CHOL, HDL, LDLCALC, TRIG, CHOLHDL, LDLDIRECT in the last 72 hours.   Thyroid Function Tests  Recent Labs (last 2 labs)   No results for input(s): TSH, T4TOTAL, T3FREE, THYROIDAB in the last 72 hours.   Invalid input(s): FREET3     Other results:     Imaging      No results found.     Medications:       Scheduled Medications:  (feeding supplement) PROSource Plus  30 mL Oral TID PC & HS   sodium chloride   Intravenous Once   amiodarone  200 mg Oral Daily   vitamin C  500 mg Oral Daily   aspirin EC  81 mg Oral Daily   atorvastatin  80 mg Oral Daily   Chlorhexidine Gluconate Cloth  6 each Topical Daily   digoxin  0.125 mg Oral Daily   doxycycline  100 mg Oral Q12H   eplerenone  25 mg Oral Daily   ezetimibe  10 mg Oral Daily   feeding supplement  237 mL Oral TID BM   folic acid  1 mg Oral Daily   gabapentin  300 mg Oral TID   Gerhardt's butt cream   Topical Daily   insulin aspart  0-15 Units Subcutaneous TID WC   losartan  25 mg Oral Daily   mouth rinse  15 mL Mouth Rinse BID   melatonin  5 mg Oral QHS   multivitamin with minerals   1 tablet Oral Q lunch   pantoprazole  40 mg Oral Daily   sertraline  100 mg Oral Daily   sodium chloride flush  10-40 mL Intracatheter Q12H   tamsulosin  0.4 mg Oral Daily   thiamine  100 mg Oral Daily   vitamin B-12  100 mcg Oral Daily   Warfarin - Pharmacist Dosing Inpatient   Does not apply q1600      Infusions:  sodium chloride Stopped (04/14/21 1149)   cefTRIAXone (ROCEPHIN)  IV 2 g (05/05/21 1024)   lactated ringers Stopped (04/13/21 1053)        PRN Medications: sodium chloride, acetaminophen, bisacodyl **OR** bisacodyl, lidocaine, morphine injection, ondansetron (ZOFRAN) IV, oxyCODONE, polyvinyl alcohol, sodium chloride flush, sodium chloride flush, traMADol       Patient Profile    Brandon [name] is a 65 year old with history of smoker, CAD s/p previous MI, HTN, HL, chronic HFrEF, sepsis 11/2020 bacteremia.   Admitted with sepsis. Bld Cx - NGTD     Assessment/Plan    1. Acute on chronic systolic HFr EF -> cardiogenic shock - Echo 05/19/20 EF 20-25% RV mildly HK. moderate AS  Mean gradient 13 AVA 1.2 cm2 DI 0.30 - Persistent NYHA IV. Admitted with shock - s/p HM-III VAD + bioprosthetic AVR on 11/25 - RV looked good on TEE in OR - Continue digoxin 0.125.  - continue Losartan 25 mg daily - Continue eplerenone 25 daily - Volume low. Got IVF bolus on 12/23. Continue to hold diuretics   2. HM-3 LVAD implant - VAD interrogated personally. Parameters stable. - Continue ASA 81 for 30 days. Stop ASA 12/25 - INR 2.9 this am. Discussed dosing with PharmD personally. - LDH 195 - MAPs 70s-80s - Had ramp ECHO speed increased to 5800.    3. Acute Hypoxic Respiratory Failure  - Extubated 11/26, stable on 2L [place].     4. 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 CAD  s/p anterior STEMI (12/20). LHC showed chronically occluded RCA (with L>>R collaterals) and thrombotic occlusion of  proximal LAD. Underwent PCI of LAD. - No s/s angina - Continue atorvastatin 80.  - Continue ASA until 12/25 d/t VAD   6. Iron-deficiency anemia & ABLA - Rec'd Feraheme 11/8 and 11/13  - Rec'd 1 unit PRBCs on 12/10 - Keep hgb > 8.0,  10.3 today - 04/23/21 Iron sats low. Given feraheme   7. Hypokalemia/ Hypomagnesemia  - Keep K > 4.0 Mg > 2.0  - K 3.8 - Supp today   8. Severe protein-calorie malnutrition - prealbumin 11>15> 13 >6.6 - Continue aggressive nutritional support   9. Paroxysmal Atrial Fibrillation w/ RVR - new, developed 11/10. Maintaining SR/ST    - Off amio drip. Continue amio 200 mg once daily  - on warfarin. INR 2.9 today.    10. Hyponatremia - Na 133 -> 130 - tolvaptan on 12/11.  - monitor    11. Sepsis - H/o strep bacteremia 11/2020.  ICD extracted 12/04/20. Barostim was not removed as it is extravascular.  - Received IV antibiotics until 01/15/21. Blood cultures negative 01/30/21..  - 10/26 -Blood CX x2  Negative  - Ct chest concerning for pneumonia. Treated w/ cefepime/vancomycin -> Augmentin (stop 11/7) - Native AoV path with questionable acute endocarditis (  multiple PMNs)  - IV ceftriaxone restarted 11/30 to cover the Streptococcus gordonae that was isolated before given possible acute AoV endocarditis on surgical path (see below) - 12/16 w/ Rigors.   PCT < 0.1, WBCs 7, afebrile.  Cultures NGTD,  vancomycin stopped.   - 12/18 + SARS-CoV-2. See below.    12. Hemorrhagic Pericarditis - Fibrin clot encasing heart at time of surgery.   - No effusion on f/u echo   13. 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  - Bcx recollected and pending  - Tissue sent to [facility] in [place] for broad 16 S ribosomal sequencing for bacterial pathogens as well as T wet Bhilai Bartonella Brucella  - ID following, recommending  IV ceftriaxone to cover the Streptococcus gordonae that was  isolated before given possible acute AoV endocarditis on surgical path. - Ceftriaxone 2 g IV daily x 6 weeks. End Date: May 22, 2021 - Home IV abx management has been arranged with [facility] and [place] line placed 12/22   14. Constipation - Resolved.     15. Bilateral pleural effusions - S/p Left thoracentesis 12/8 but still with residual fluid. Will likely need repeat L tap in a few days - R thoracentesis 12/9.  - S/p repeat left thoracentesis 12/12 w/ 1L Fluid removal  - Repeat CXR 12/15 w/ small b/l effusions.  - Lungs okay on exam. O2 sats stable. - resolved   16. SARS-CoV-2 -Completed course of Remdesivir   Disposition: -Anticipate discharge on 12/27 once he has caregiver support at home. -Will need home antibiotics, already arranged with [facility] and [place]   Length of Stay: [place], MD  05/05/2021, 1:47 PM   Advanced Heart Failure Team Pager [phone] (M-F; 7a - 5p)  Please contact [facility] for night-coverage after hours (5p -7a ) and weekends on [email or web address]

## 2021-05-06 LAB — CBC
HCT: 31.4 % — ABNORMAL LOW (ref 39.0–52.0)
Hemoglobin: 9.5 g/dL — ABNORMAL LOW (ref 13.0–17.0)
MCH: 24.5 pg — ABNORMAL LOW (ref 26.0–34.0)
MCHC: 30.3 g/dL (ref 30.0–36.0)
MCV: 80.9 fL (ref 80.0–100.0)
Platelets: 238 10*3/uL (ref 150–400)
RBC: 3.88 MIL/uL — ABNORMAL LOW (ref 4.22–5.81)
RDW: 19.4 % — ABNORMAL HIGH (ref 11.5–15.5)
WBC: 9.2 10*3/uL (ref 4.0–10.5)
nRBC: 0 % (ref 0.0–0.2)

## 2021-05-06 LAB — GLUCOSE, CAPILLARY
Glucose-Capillary: 82 mg/dL (ref 70–99)
Glucose-Capillary: 110 mg/dL — ABNORMAL HIGH (ref 70–99)
Glucose-Capillary: 106 mg/dL — ABNORMAL HIGH (ref 70–99)
Glucose-Capillary: 111 mg/dL — ABNORMAL HIGH (ref 70–99)

## 2021-05-06 LAB — BASIC METABOLIC PANEL
Anion gap: 7 (ref 5–15)
BUN: 11 mg/dL (ref 8–23)
CO2: 30 mmol/L (ref 22–32)
Calcium: 8.6 mg/dL — ABNORMAL LOW (ref 8.9–10.3)
Chloride: 95 mmol/L — ABNORMAL LOW (ref 98–111)
Creatinine, Ser: 0.66 mg/dL (ref 0.61–1.24)
GFR, Estimated: >60 mL/min (ref >60)
Glucose, Bld: 103 mg/dL — ABNORMAL HIGH (ref 70–99)
Potassium: 3.7 mmol/L (ref 3.5–5.1)
Sodium: 132 mmol/L — ABNORMAL LOW (ref 135–145)

## 2021-05-06 LAB — LACTATE DEHYDROGENASE: LDH: 158 U/L (ref 98–192)

## 2021-05-06 LAB — PROTIME-INR
INR: 2.5 — ABNORMAL HIGH (ref 0.8–1.2)
Prothrombin Time: 26.7 seconds — ABNORMAL HIGH (ref 11.4–15.2)

## 2021-05-06 MED ORDER — WARFARIN SODIUM 4 MG PO TABS
4.0000 mg | ORAL_TABLET | Freq: Once | ORAL | Status: AC
  Administered 2021-05-06: 15:00:00 4 mg via ORAL
  Filled 2021-05-06: qty 1

## 2021-05-06 NOTE — Progress Notes (Signed)
ANTICOAGULATION CONSULT NOTE   Pharmacy Consult for warfarin  Indication:  s/p VAD (HM3) , afib, hx DVT  No Active Allergies  Patient Measurements: Height: 6' (182.9 cm) Weight: 78.6 kg (173 lb 4.5 oz) IBW/kg (Calculated) : 77.6   Vital Signs: Temp: 97.6 F (36.4 C) (12/25 1200) Temp Source: Oral (12/25 1200) BP: 95/62 (12/25 1200) Pulse Rate: 119 (12/25 1200)  Labs: Recent Labs    05/04/21 0410 05/05/21 0355 05/06/21 0359  HGB 10.3* 9.7* 9.5*  HCT 33.8* 31.5* 31.4*  PLT 268 232 238  LABPROT 28.5* 30.3* 26.7*  INR 2.7* 2.9* 2.5*  CREATININE 0.90 0.73 0.66     Estimated Creatinine Clearance: 98.3 mL/min (by C-G formula based on SCr of 0.66 mg/dL).   Medical History: Past Medical History:  Diagnosis Date   AICD (automatic cardioverter/defibrillator) present 08/31/2019   Ankylosing spondylitis ([place])    Arthritis    BENIGN PROSTATIC HYPERTROPHY 06/07/2008   CHF (congestive heart failure) ([place])    COLONIC POLYPS, HX OF 06/07/2008   Depression    H/O hiatal hernia    Heart failure ([place])    HYPERLIPIDEMIA 06/07/2008   HYPERTENSION 06/07/2008   Myocardial infarction [facility]) 2005   NSTEMI, s/p LAD stent   NEPHROLITHIASIS, HX OF 06/07/2008   STEMI (ST elevation myocardial infarction) ([place]) 04/27/2019    Assessment: 65 yo M s/p HM 3 and AVR on 04/06/21. Prior to surgery/admit patient was on apixiban for a DVT.  Pt s/p thora 12/8, 12/9, and 12/12. INR subtherapeutic after lower doses for that procedure. Started fixed heparin dose at 400 uts/hr started on 12/12 with heparin level daily. - stopped when INR >1.8   INR trended up above goal to 2.9 >2.5 after holding 1 dose  Hgb 9-10s. No bleeding issues noted. LDH stable < 200     Goal of Therapy:  Warfarin INR 2.0-2.5 Monitor platelets by anticoagulation protocol: Yes   Plan:  Warfarin 4mg  x1 tonight Will watch trend and attempt to clarify discharge dose over the weekend in time for discharge on Monday.  ASA until  12/25 - stop date entered   Bonnita Nasuti Pharm.D. CPP, BCPS Clinical Pharmacist [phone] 05/06/2021 1:08 PM

## 2021-05-06 NOTE — Progress Notes (Signed)
Patient ID: Brandon [name], male   DOB: [birth year], 65 y.o.   MRN: [id number]      Advanced Heart Failure Rounding Note  PCP-Cardiologist: [name], MD  [facility]): Dr. [name]   Subjective:    Admitted with sepsis. Started on cefepime + vanc, now off abx (ID following). Afebrile.  10/29 Co-ox 48%, started on milrinone 0.25.  10/30 IV Antibiotics stopped. Blood cultures no growth for 5 days.  10/31 started on Augmentin for PNA.  11/1 Milrinone off 11/2 Milrinone added back. Midodrine increased to 10 mg TID 11/10 Developed Afib w/ RVR, converted with IV amio 11/14 Milrinone increased to 0.25 mcg. Diuresed with IV lasix  11/25 Underwent HM-3 VAD placement and bioprosthetic AVR. Had persistent hypotension in OR requiring methylene blue and high dose pressors.  11/26 Extubated 11/28 Started on lasix gtt>>later stopped due to low CVP (1) and increasing pressor requirements. Restarted on scheduled BID IV Lasix once pressure stabilized (still volume up)  11/28 Surgical path with evidence of possible "acute AoV endocarditis". ID consulted. Bcx recollected. Tissue sent to [facility] in [place].   11/30 Started on IV ceftriaxone to cover the Streptococcus gordonae that was isolated before, per ID. 11/30 VAD speed increased to 5600 12/1 Diuresed with IV lasix + metolazone. Milrinone was stopped. Amio drip transitioned to amio 200 mg twice a day.  12/5 Ramp Echo speed increased to 5800. Epi stopped.  12/6 Transferred to [place]. Had BM. Started on spiro 12.5 mg daily 12/8 CXR with significant bilateral pleural effusions. S/p Left Thoracentesis w/ 1.7 L out but still with residual fluid 12/9 Rt Thoracentesis  12/11 Given dose of tolvaptan and diuresed with IV lasix. Negative 2.5 liters.  12/12 Repeat Lt Thoracentesis w/ 1L Fluid removal  12/18 + SARS-CoV-2  Received more IVF last night. Feels much better today. Denies SOB, orthopnea or PND. INR 2.5. Remains on COVID precautions.   LVAD Interrogation HM  3: Speed: 5800 Flow: 4.8 PI: 3.8 Power: 5.0 VAD interrogated personally. Parameters stable.   Objective:   Weight Range: 78.6 kg Body mass index is 23.5 kg/m.   Vital Signs:   Temp:  [97.6 F (36.4 C)-98.2 F (36.8 C)] 97.6 F (36.4 C) (12/25 1200) Pulse Rate:  [77-119] 119 (12/25 1200) Resp:  [14-20] 14 (12/25 1200) BP: (94-113)/(62-88) 95/62 (12/25 1200) SpO2:  [96 %-98 %] 97 % (12/25 1200) Weight:  [78.6 kg] 78.6 kg (12/25 0136) Last BM Date: 05/05/21  Weight change: Filed Weights   05/04/21 0348 05/05/21 0656 05/06/21 0136  Weight: 73 kg 75.1 kg 78.6 kg    Intake/Output:   Intake/Output Summary (Last 24 hours) at 05/06/2021 1310 Last data filed at 05/06/2021 1219 Gross per 24 hour  Intake 1680 ml  Output 1875 ml  Net -195 ml      Physical Exam   General:  NAD.  HEENT: normal  Neck: supple. JVP not elevated.  Carotids 2+ bilat; no bruits. No lymphadenopathy or thryomegaly appreciated. Cor: LVAD hum.  Lungs: Clear. Abdomen: soft, nontender, non-distended. No hepatosplenomegaly. No bruits or masses. Good bowel sounds. Driveline site clean. Anchor in place.  Extremities: no cyanosis, clubbing, rash. Warm no edema  Neuro: alert & oriented x 3. No focal deficits. Moves all 4 without problem    Telemetry   SR 70-80s Personally reviewed  Labs    CBC Recent Labs    05/05/21 0355 05/06/21 0359  WBC 8.6 9.2  HGB 9.7* 9.5*  HCT 31.5* 31.4*  MCV 79.7* 80.9  PLT  232 811    Basic Metabolic Panel Recent Labs    05/04/21 0410 05/05/21 0355 05/06/21 0359  NA 133* 130* 132*  K 3.8 3.8 3.7  CL 91* 93* 95*  CO2 33* 31 30  GLUCOSE 86 85 103*  BUN 12 15 11   CREATININE 0.90 0.73 0.66  CALCIUM 8.5* 8.3* 8.6*  MG 1.9  --   --   PHOS 4.5  --   --     Liver Function Tests No results for input(s): AST, ALT, ALKPHOS, BILITOT, PROT, ALBUMIN in the last 72 hours.    No results for input(s): LIPASE, AMYLASE in the last 72 hours. Cardiac Enzymes No  results for input(s): CKTOTAL, CKMB, CKMBINDEX, TROPONINI in the last 72 hours.  BNP: BNP (last 3 results) Recent Labs    04/20/21 0600 04/26/21 0420 05/04/21 0410  BNP 193.0* 273.9* 156.7*     ProBNP (last 3 results) No results for input(s): PROBNP in the last 8760 hours.    D-Dimer Recent Labs    05/04/21 0410  DDIMER 3.92*    Hemoglobin A1C No results for input(s): HGBA1C in the last 72 hours.  Fasting Lipid Panel No results for input(s): CHOL, HDL, LDLCALC, TRIG, CHOLHDL, LDLDIRECT in the last 72 hours. Thyroid Function Tests No results for input(s): TSH, T4TOTAL, T3FREE, THYROIDAB in the last 72 hours.  Invalid input(s): FREET3  Other results:   Imaging    No results found.   Medications:     Scheduled Medications:  (feeding supplement) PROSource Plus  30 mL Oral TID PC & HS   sodium chloride   Intravenous Once   amiodarone  200 mg Oral Daily   vitamin C  500 mg Oral Daily   atorvastatin  80 mg Oral Daily   Chlorhexidine Gluconate Cloth  6 each Topical Daily   digoxin  0.125 mg Oral Daily   doxycycline  100 mg Oral Q12H   eplerenone  25 mg Oral Daily   ezetimibe  10 mg Oral Daily   feeding supplement  237 mL Oral TID BM   folic acid  1 mg Oral Daily   gabapentin  300 mg Oral TID   Gerhardt's butt cream   Topical Daily   insulin aspart  0-15 Units Subcutaneous TID WC   losartan  25 mg Oral Daily   mouth rinse  15 mL Mouth Rinse BID   melatonin  5 mg Oral QHS   multivitamin with minerals  1 tablet Oral Q lunch   pantoprazole  40 mg Oral Daily   sertraline  100 mg Oral Daily   sodium chloride flush  10-40 mL Intracatheter Q12H   tamsulosin  0.4 mg Oral Daily   thiamine  100 mg Oral Daily   vitamin B-12  100 mcg Oral Daily   Warfarin - Pharmacist Dosing Inpatient   Does not apply q1600    Infusions:  sodium chloride Stopped (04/14/21 1149)   cefTRIAXone (ROCEPHIN)  IV 2 g (05/06/21 1029)   lactated ringers Stopped (04/13/21 1053)      PRN Medications: sodium chloride, acetaminophen, bisacodyl **OR** bisacodyl, lidocaine, morphine injection, ondansetron (ZOFRAN) IV, oxyCODONE, polyvinyl alcohol, sodium chloride flush, sodium chloride flush, traMADol    Patient Profile   Mr Stencel is a 65 year old with history of smoker, CAD s/p previous MI, HTN, HL, chronic HFrEF, sepsis 11/2020 bacteremia.  Admitted with sepsis. Bld Cx - NGTD    Assessment/Plan   1. Acute on chronic systolic HFr EF -> cardiogenic  shock - Echo 05/19/20 EF 20-25% RV mildly HK. moderate AS  Mean gradient 13 AVA 1.2 cm2 DI 0.30 - Persistent NYHA IV. Admitted with shock - s/p HM-III VAD + bioprosthetic AVR on 11/25 - RV looked good on TEE in OR - Continue digoxin 0.125.  - continue Losartan 25 mg daily - Continue eplerenone 25 daily - Volume low. Got IVF bolus on 12/23 and 12/24. Continue to hold diuretics  2. HM-3 LVAD implant - VAD interrogated personally. Parameters stable. - ASA topped today - INR 2.5 this am. Discussed dosing with PharmD personally. - LDH 158 - MAPs 70-80s - Had ramp ECHO speed increased to 5800.   3. Acute Hypoxic Respiratory Failure  - Extubated 11/26, stable on 2L [place].    4. 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 CAD  s/p anterior STEMI (12/20). LHC showed chronically occluded RCA (with L>>R collaterals) and thrombotic occlusion of proximal LAD. Underwent PCI of LAD. - No s/s angina - Continue atorvastatin 80.  - Continue ASA until 12/25 d/t VAD  6. Iron-deficiency anemia & ABLA - Rec'd Feraheme 11/8 and 11/13  - Rec'd 1 unit PRBCs on 12/10 - Keep hgb > 8.0,  9.5 today - 04/23/21 Iron sats low. Given feraheme  7. Hypokalemia/ Hypomagnesemia  - Keep K > 4.0 Mg > 2.0  - K 3.7 - Supp today  8. Severe protein-calorie malnutrition - prealbumin 11>15> 13 >6.6 - Continue aggressive nutritional support  9.  Paroxysmal Atrial Fibrillation w/ RVR - new, developed 11/10. Maintaining SR/ST    - Off amio drip. Continue amio 200 mg once daily  - on warfarin. INR 2.9 today.   10. Hyponatremia - Na 133 -> 130 - tolvaptan on 12/11.  - monitor   11. Sepsis - H/o strep bacteremia 11/2020.  ICD extracted 12/04/20. Barostim was not removed as it is extravascular.  - Received IV antibiotics until 01/15/21. Blood cultures negative 01/30/21..  - 10/26 -Blood CX x2  Negative  - Ct chest concerning for pneumonia. 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   PCT < 0.1, WBCs 7, afebrile.  Cultures NGTD,  vancomycin stopped.   - 12/18 + SARS-CoV-2. See below.   12. Hemorrhagic Pericarditis - Fibrin clot encasing heart at time of surgery.   - No effusion on f/u echo  13. 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  - Bcx recollected and pending  - Tissue sent to [facility] in [place] for broad 16 S ribosomal sequencing for bacterial pathogens as well as T wet Bhilai Bartonella Brucella  - ID following, recommending  IV ceftriaxone to cover the Streptococcus gordonae that was isolated before given possible acute AoV endocarditis on surgical path. - Ceftriaxone 2 g IV daily x 6 weeks. End Date: May 22, 2021 - Home IV abx management has been arranged with [facility] and [place] line placed 12/22  14. Constipation - Resolved.    15. Bilateral pleural effusions - S/p Left thoracentesis 12/8 but still with residual fluid. Will likely need repeat L tap in a few days - R  thoracentesis 12/9.  - S/p repeat left thoracentesis 12/12 w/ 1L Fluid  removal  - Repeat CXR 12/15 w/ small b/l effusions.  - Lungs okay on exam. O2 sats stable. - resolved  16. SARS-CoV-2 -Completed course of Remdesivir  Disposition: -Anticipate discharge on 12/27 once he has caregiver support at home. -Will need home antibiotics, already arranged with [facility] and [place]  Length of Stay: [place], MD  05/06/2021, 1:10 PM  Advanced Heart Failure Team Pager [phone] (M-F; 7a - 5p)  Please contact [facility] for night-coverage after hours (5p -7a ) and weekends on [email or web address]

## 2021-05-07 LAB — GLUCOSE, CAPILLARY
Glucose-Capillary: 88 mg/dL (ref 70–99)
Glucose-Capillary: 99 mg/dL (ref 70–99)
Glucose-Capillary: 146 mg/dL — ABNORMAL HIGH (ref 70–99)

## 2021-05-07 LAB — BASIC METABOLIC PANEL
Anion gap: 7 (ref 5–15)
BUN: 13 mg/dL (ref 8–23)
CO2: 29 mmol/L (ref 22–32)
Calcium: 8.5 mg/dL — ABNORMAL LOW (ref 8.9–10.3)
Chloride: 97 mmol/L — ABNORMAL LOW (ref 98–111)
Creatinine, Ser: 0.72 mg/dL (ref 0.61–1.24)
GFR, Estimated: >60 mL/min (ref >60)
Glucose, Bld: 90 mg/dL (ref 70–99)
Potassium: 3.8 mmol/L (ref 3.5–5.1)
Sodium: 133 mmol/L — ABNORMAL LOW (ref 135–145)

## 2021-05-07 LAB — PROTIME-INR
INR: 2.5 — ABNORMAL HIGH (ref 0.8–1.2)
Prothrombin Time: 27.1 seconds — ABNORMAL HIGH (ref 11.4–15.2)

## 2021-05-07 LAB — CBC
HCT: 30.5 % — ABNORMAL LOW (ref 39.0–52.0)
Hemoglobin: 9.4 g/dL — ABNORMAL LOW (ref 13.0–17.0)
MCH: 24.9 pg — ABNORMAL LOW (ref 26.0–34.0)
MCHC: 30.8 g/dL (ref 30.0–36.0)
MCV: 80.9 fL (ref 80.0–100.0)
Platelets: 223 10*3/uL (ref 150–400)
RBC: 3.77 MIL/uL — ABNORMAL LOW (ref 4.22–5.81)
RDW: 19.6 % — ABNORMAL HIGH (ref 11.5–15.5)
WBC: 8.1 10*3/uL (ref 4.0–10.5)
nRBC: 0 % (ref 0.0–0.2)

## 2021-05-07 LAB — LACTATE DEHYDROGENASE: LDH: 146 U/L (ref 98–192)

## 2021-05-07 MED ORDER — FUROSEMIDE 10 MG/ML IJ SOLN
80.0000 mg | Freq: Once | INTRAMUSCULAR | Status: AC
  Administered 2021-05-07: 07:00:00 80 mg via INTRAVENOUS
  Filled 2021-05-07: qty 8

## 2021-05-07 MED ORDER — WARFARIN SODIUM 2.5 MG PO TABS
2.5000 mg | ORAL_TABLET | Freq: Once | ORAL | Status: AC
  Administered 2021-05-07: 16:00:00 2.5 mg via ORAL
  Filled 2021-05-07: qty 1

## 2021-05-07 MED ORDER — POTASSIUM CHLORIDE CRYS ER 20 MEQ PO TBCR
40.0000 meq | EXTENDED_RELEASE_TABLET | Freq: Once | ORAL | Status: AC
  Administered 2021-05-07: 09:00:00 40 meq via ORAL
  Filled 2021-05-07: qty 2

## 2021-05-07 NOTE — Progress Notes (Signed)
Physical Therapy Treatment Patient Details Name: Brandon [name] MRN: [id number] DOB: October 29, 1955 Today's Date: 05/07/2021   History of Present Illness Pt is a 65 y.o. male admitted 03/07/21 with acute on chronic hypoxemic respiratory failure, cardiogenic shock. CT scan which shows bronchopneumonia and no indication of ILD. Prolonged admission undergoing workup for VAD. S/p LVAD placement 11/25. CXR 12/8 with significant bilateral pleural effusions. S/p L thoracentesis 12/8 and 12/12.  S/p R thoracentesis 12/9. Tested positive for COVID 12/18 after exposed by visitor. Other PMH includes CHF, ankylosing spondylitis, bacteremia and  possible endocarditis s/p ICD removal, HTN, depression, arthritis.    PT Comments    Pt continues to progress after latest setback with covid. Pt with incr amb but still confined to room due to covid precautions. Was able to perform power module to battery to power module change using his written instructions and supervision.    Recommendations for follow up therapy are one component of a multi-disciplinary discharge planning process, led by the attending physician.  Recommendations may be updated based on patient status, additional functional criteria and insurance authorization.  Follow Up Recommendations  Home health PT     Assistance Recommended at Discharge Intermittent Supervision/Assistance  Equipment Recommendations  Rolling walker (2 wheels)    Recommendations for Other Services       Precautions / Restrictions Precautions Precautions: Fall;Sternal;Other (comment) Precaution Comments: LVAD, COVID     Mobility  Bed Mobility               General bed mobility comments: Pt up in chair    Transfers Overall transfer level: Needs assistance Equipment used: Rolling walker (2 wheels);None Transfers: Sit to/from Stand Sit to Stand: Modified independent (Device/Increase time)     Step pivot transfers: Supervision     General transfer  comment: Pt with appropriate hand placement. Pt placed LVAD controller carrier around neck prior to initiating stand without any cues. Stepped from recliner to bed to recliner with supervision only for safety    Ambulation/Gait Ambulation/Gait assistance: Supervision Gait Distance (Feet): 100 Feet Assistive device: Rolling walker (2 wheels) Gait Pattern/deviations: Step-through pattern;Decreased stride length;Trunk flexed Gait velocity: decr Gait velocity interpretation: 1.31 - 2.62 ft/sec, indicative of limited community ambulator   General Gait Details: supervision for safety/lines   Stairs             Wheelchair Mobility    Modified Rankin (Stroke Patients Only)       Balance Overall balance assessment: Needs assistance Sitting-balance support: No upper extremity supported;Feet supported Sitting balance-Leahy Scale: Normal     Standing balance support: No upper extremity supported;During functional activity Standing balance-Leahy Scale: Good                              Cognition Arousal/Alertness: Awake/alert Behavior During Therapy: WFL for tasks assessed/performed Overall Cognitive Status: No family/caregiver present to determine baseline cognitive functioning Area of Impairment: Memory                     Memory: Decreased short-term memory         General Comments: Pt able to perform power source changes using written handout.        Exercises      General Comments        Pertinent Vitals/Pain Pain Assessment: No/denies pain    Home Living  Prior Function            PT Goals (current goals can now be found in the care plan section) Acute Rehab PT Goals Patient Stated Goal: return home Progress towards PT goals: Progressing toward goals    Frequency    Min 3X/week      PT Plan Current plan remains appropriate    Co-evaluation              AM-PAC PT "6 Clicks"  Mobility   Outcome Measure  Help needed turning from your back to your side while in a flat bed without using bedrails?: None Help needed moving from lying on your back to sitting on the side of a flat bed without using bedrails?: None Help needed moving to and from a bed to a chair (including a wheelchair)?: A Little Help needed standing up from a chair using your arms (e.g., wheelchair or bedside chair)?: A Little Help needed to walk in hospital room?: A Little Help needed climbing 3-5 steps with a railing? : A Little 6 Click Score: 20    End of Session   Activity Tolerance: Patient tolerated treatment well Patient left: in chair;with call bell/phone within reach;with nursing/sitter in room   PT Visit Diagnosis: Difficulty in walking, not elsewhere classified (R26.2);Muscle weakness (generalized) (M62.81)     Time: 1510-1530 PT Time Calculation (min) (ACUTE ONLY): 20 min  Charges:  $Gait Training: 8-22 mins                     [place] Pager [phone] Office [place] 05/07/2021, 4:40 PM

## 2021-05-07 NOTE — Progress Notes (Addendum)
Patient ID: Brandon [name], male   DOB: 1956/04/01, 65 y.o.   MRN: [id number]      Advanced Heart Failure Rounding Note  PCP-Cardiologist: [name], MD  [facility]: Dr. [name]   Subjective:    Admitted with sepsis. Started on cefepime + vanc, now off abx (ID following). Afebrile.  10/29 Co-ox 48%, started on milrinone 0.25.  10/30 IV Antibiotics stopped. Blood cultures no growth for 5 days.  10/31 started on Augmentin for PNA.  11/1 Milrinone off 11/2 Milrinone added back. Midodrine increased to 10 mg TID 11/10 Developed Afib w/ RVR, converted with IV amio 11/14 Milrinone increased to 0.25 mcg. Diuresed with IV lasix  11/25 Underwent HM-3 VAD placement and bioprosthetic AVR. Had persistent hypotension in OR requiring methylene blue and high dose pressors.  11/26 Extubated 11/28 Started on lasix gtt>>later stopped due to low CVP (1) and increasing pressor requirements. Restarted on scheduled BID IV Lasix once pressure stabilized (still volume up)  11/28 Surgical path with evidence of possible "acute AoV endocarditis". ID consulted. Bcx recollected. Tissue sent to [facility] in [place].   11/30 Started on IV ceftriaxone to cover the Streptococcus gordonae that was isolated before, per ID. 11/30 VAD speed increased to 5600 12/1 Diuresed with IV lasix + metolazone. Milrinone was stopped. Amio drip transitioned to amio 200 mg twice a day.  12/5 Ramp Echo speed increased to 5800. Epi stopped.  12/6 Transferred to [place]. Had BM. Started on spiro 12.5 mg daily 12/8 CXR with significant bilateral pleural effusions. S/p Left Thoracentesis w/ 1.7 L out but still with residual fluid 12/9 Rt Thoracentesis  12/11 Given dose of tolvaptan and diuresed with IV lasix. Negative 2.5 liters.  12/12 Repeat Lt Thoracentesis w/ 1L Fluid removal  12/18 + SARS-CoV-2  Received IVF over the weekend. Diuretics held. Feels very good today but feet swollen.  Denies CP, SOB, orthopnea or PND. PICC placed for IV abx.  Completed remdesivir  LVAD Interrogation HM 3: Speed: 5800 Flow: 4.6 PI: 3.8 Power: 5.0 VAD interrogated personally. Parameters stable.   Objective:   Weight Range: 78.6 kg Body mass index is 23.5 kg/m.   Vital Signs:   Temp:  [97.6 F (36.4 C)-98.2 F (36.8 C)] 97.8 F (36.6 C) (12/26 0327) Pulse Rate:  [79-119] 83 (12/26 0327) Resp:  [14-20] 20 (12/26 0327) BP: (83-98)/(62-82) 83/62 (12/26 0327) SpO2:  [97 %-98 %] 98 % (12/26 0327) Last BM Date: 05/06/21  Weight change: Filed Weights   05/04/21 0348 05/05/21 0656 05/06/21 0136  Weight: 73 kg 75.1 kg 78.6 kg    Intake/Output:   Intake/Output Summary (Last 24 hours) at 05/07/2021 0334 Last data filed at 05/07/2021 0300 Gross per 24 hour  Intake 1260 ml  Output 2575 ml  Net -1315 ml      Physical Exam   General:  NAD.  HEENT: normal  Neck: supple. JVP 8-9.  Carotids 2+ bilat; no bruits. No lymphadenopathy or thryomegaly appreciated. Cor: LVAD hum.  Lungs: Clear. Abdomen: soft, nontender, non-distended. No hepatosplenomegaly. No bruits or masses. Good bowel sounds. Driveline site clean. Anchor in place.  Extremities: no cyanosis, clubbing, rash. Warm 2+ edema  Neuro: alert & oriented x 3. No focal deficits. Moves all 4 without problem     Telemetry   SR 70-80s Personally reviewed  Labs    CBC Recent Labs    05/05/21 0355 05/06/21 0359  WBC 8.6 9.2  HGB 9.7* 9.5*  HCT 31.5* 31.4*  MCV 79.7* 80.9  PLT 232  301    Basic Metabolic Panel Recent Labs    05/04/21 0410 05/05/21 0355 05/06/21 0359  NA 133* 130* 132*  K 3.8 3.8 3.7  CL 91* 93* 95*  CO2 33* 31 30  GLUCOSE 86 85 103*  BUN 12 15 11   CREATININE 0.90 0.73 0.66  CALCIUM 8.5* 8.3* 8.6*  MG 1.9  --   --   PHOS 4.5  --   --     Liver Function Tests No results for input(s): AST, ALT, ALKPHOS, BILITOT, PROT, ALBUMIN in the last 72 hours.    No results for input(s): LIPASE, AMYLASE in the last 72 hours. Cardiac Enzymes No  results for input(s): CKTOTAL, CKMB, CKMBINDEX, TROPONINI in the last 72 hours.  BNP: BNP (last 3 results) Recent Labs    04/20/21 0600 04/26/21 0420 05/04/21 0410  BNP 193.0* 273.9* 156.7*     ProBNP (last 3 results) No results for input(s): PROBNP in the last 8760 hours.    D-Dimer Recent Labs    05/04/21 0410  DDIMER 3.92*    Hemoglobin A1C No results for input(s): HGBA1C in the last 72 hours.  Fasting Lipid Panel No results for input(s): CHOL, HDL, LDLCALC, TRIG, CHOLHDL, LDLDIRECT in the last 72 hours. Thyroid Function Tests No results for input(s): TSH, T4TOTAL, T3FREE, THYROIDAB in the last 72 hours.  Invalid input(s): FREET3  Other results:   Imaging    No results found.   Medications:     Scheduled Medications:  (feeding supplement) PROSource Plus  30 mL Oral TID PC & HS   sodium chloride   Intravenous Once   amiodarone  200 mg Oral Daily   vitamin C  500 mg Oral Daily   atorvastatin  80 mg Oral Daily   Chlorhexidine Gluconate Cloth  6 each Topical Daily   digoxin  0.125 mg Oral Daily   doxycycline  100 mg Oral Q12H   eplerenone  25 mg Oral Daily   ezetimibe  10 mg Oral Daily   feeding supplement  237 mL Oral TID BM   folic acid  1 mg Oral Daily   gabapentin  300 mg Oral TID   Gerhardt's butt cream   Topical Daily   insulin aspart  0-15 Units Subcutaneous TID WC   losartan  25 mg Oral Daily   mouth rinse  15 mL Mouth Rinse BID   melatonin  5 mg Oral QHS   multivitamin with minerals  1 tablet Oral Q lunch   pantoprazole  40 mg Oral Daily   sertraline  100 mg Oral Daily   sodium chloride flush  10-40 mL Intracatheter Q12H   tamsulosin  0.4 mg Oral Daily   thiamine  100 mg Oral Daily   vitamin B-12  100 mcg Oral Daily   Warfarin - Pharmacist Dosing Inpatient   Does not apply q1600    Infusions:  sodium chloride Stopped (04/14/21 1149)   cefTRIAXone (ROCEPHIN)  IV 2 g (05/06/21 1029)   lactated ringers Stopped (04/13/21 1053)      PRN Medications: sodium chloride, acetaminophen, bisacodyl **OR** bisacodyl, lidocaine, morphine injection, ondansetron (ZOFRAN) IV, oxyCODONE, polyvinyl alcohol, sodium chloride flush, sodium chloride flush, traMADol    Patient Profile   Mr Ranieri is a 65 year old with history of smoker, CAD s/p previous MI, HTN, HL, chronic HFrEF, sepsis 11/2020 bacteremia.  Admitted with sepsis. Bld Cx - NGTD    Assessment/Plan   1. Acute on chronic systolic HFr EF -> cardiogenic shock -  Echo 05/19/20 EF 20-25% RV mildly HK. moderate AS  Mean gradient 13 AVA 1.2 cm2 DI 0.30 - Persistent NYHA IV. Admitted with shock - s/p HM-III VAD + bioprosthetic AVR on 11/25 - RV looked good on TEE in OR - Continue digoxin 0.125.  - continue Losartan 25 mg daily - Continue eplerenone 25 daily - Volume was low. Got IVF bolus on 12/23 and 12/24.  - 2+ edema today. Give lasix 80 iV today. Will need some torsemide on d/c - Stable continue current regimen  2. HM-3 LVAD implant - VAD interrogated personally. Parameters stable. - ASA topped today - INR pending this am. Goal 2.0-3.0  PharmD following - LDH pending - MAPs 70-80s  - Had ramp ECHO speed increased to 5800.   3. Acute Hypoxic Respiratory Failure  - Extubated 11/26, stable on 2L [place].    4. 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 CAD  s/p anterior STEMI (12/20). LHC showed chronically occluded RCA (with L>>R collaterals) and thrombotic occlusion of proximal LAD. Underwent PCI of LAD. - No s/s angina - Continue atorvastatin 80.  - Continue ASA until 12/25 d/t VAD  6. Iron-deficiency anemia & ABLA - Rec'd Feraheme 11/8 and 11/13  - Rec'd 1 unit PRBCs on 12/10 - Keep hgb > 8.0,   - 04/23/21 Iron sats low. Given feraheme  7. Hypokalemia/ Hypomagnesemia  - Keep K > 4.0 Mg > 2.0  - K 3.9 - Supp today  8. Severe protein-calorie  malnutrition - prealbumin 11>15> 13 >6.6 - Continue aggressive nutritional support  9. Paroxysmal Atrial Fibrillation w/ RVR - new, developed 11/10. Maintaining SR/ST    - Off amio drip. Continue amio 200 mg once daily. Stop as outpatient - on warfarin. INR pending today.   10. Hyponatremia - Na 133 -> 130 yesterday - tolvaptan on 12/11.  - monitor   11. Sepsis - H/o strep bacteremia 11/2020.  ICD extracted 12/04/20. Barostim was not removed as it is extravascular.  - Received IV antibiotics until 01/15/21. Blood cultures negative 01/30/21..  - 10/26 -Blood CX x2  Negative  - Ct chest concerning for pneumonia. 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   PCT < 0.1, WBCs 7, afebrile.  Cultures NGTD,  vancomycin stopped.   - 12/18 + SARS-CoV-2. See below.   12. Hemorrhagic Pericarditis - Fibrin clot encasing heart at time of surgery.   - No effusion on f/u echo  13. 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 - Ceftriaxone 2 g IV daily x 6 weeks. End Date: May 22, 2021 - Home IV abx management has been arranged with [facility] and [place] line placed 12/22  14. Constipation - Resolved.    15. Bilateral pleural effusions - S/p Left  thoracentesis 12/8 but still with residual fluid.  Will likely need repeat L tap in a few days - R thoracentesis 12/9.  - S/p repeat left thoracentesis 12/12 w/ 1L Fluid removal  - Repeat CXR 12/15 w/ small b/l effusions.  - Lungs okay on exam. O2 sats stable. - resolved  16. SARS-CoV-2 -Completed course of Remdesivir  Disposition: -Anticipate discharge tomorrow once he has caregiver support at home. (Recovering from [place]) -Will need home antibiotics, already arranged with [facility] and [place]  Length of Stay: [place], MD  05/07/2021, 3:34 AM  Advanced Heart Failure Team Pager [phone] (M-F; 7a - 5p)  Please contact [facility] for night-coverage after hours (5p -7a ) and weekends on [email or web address]

## 2021-05-07 NOTE — Progress Notes (Signed)
Mobility Specialist: Progress Note   05/07/21 1339  Mobility  Activity Ambulated in room  Level of Assistance Standby assist, set-up cues, supervision of patient - no hands on  Assistive Device Front wheel walker  Distance Ambulated (ft) 160 ft  Mobility Ambulated with assistance in room  Mobility Response Tolerated well  Mobility performed by Mobility specialist  Bed Position Chair  $Mobility charge 1 Mobility   Pre-Mobility on 2 L/min [place]: 92 HR, 99% SpO2 Post-Mobility on RA: 92 HR, 96% SpO2  Pt independent at beginning of session and attempted ambulation w/o AD. Pt a little unsteady so used RW for rest of session. Pt c/o feeling dizzy during ambulation, resolved quickly after sitting. Pt required minA to switch to/from batteries today. Pt to recliner after walk with call bell at his side.   [facility] Alexiz Cothran Mobility Specialist Mobility Specialist 4 [place]: [phone] Mobility Specialist 2 [place]: [phone]

## 2021-05-07 NOTE — Progress Notes (Addendum)
ANTICOAGULATION CONSULT NOTE   Pharmacy Consult for warfarin  Indication:  s/p VAD (HM3) , afib, hx DVT  No Active Allergies  Patient Measurements: Height: 6' (182.9 cm) Weight: 77.4 kg (170 lb 10.2 oz) IBW/kg (Calculated) : 77.6   Vital Signs: Temp: 97.8 F (36.6 C) (12/26 0327) Temp Source: Oral (12/26 0327) BP: 83/62 (12/26 0329) Pulse Rate: 83 (12/26 0327)  Labs: Recent Labs    05/05/21 0355 05/06/21 0359 05/07/21 0400  HGB 9.7* 9.5* 9.4*  HCT 31.5* 31.4* 30.5*  PLT 232 238 223  LABPROT 30.3* 26.7* 27.1*  INR 2.9* 2.5* 2.5*  CREATININE 0.73 0.66 0.72     Estimated Creatinine Clearance: 98.1 mL/min (by C-G formula based on SCr of 0.72 mg/dL).   Medical History: Past Medical History:  Diagnosis Date   AICD (automatic cardioverter/defibrillator) present 08/31/2019   Ankylosing spondylitis ([place])    Arthritis    BENIGN PROSTATIC HYPERTROPHY 06/07/2008   CHF (congestive heart failure) ([place])    COLONIC POLYPS, HX OF 06/07/2008   Depression    H/O hiatal hernia    Heart failure ([place])    HYPERLIPIDEMIA 06/07/2008   HYPERTENSION 06/07/2008   Myocardial infarction [facility] 2005   NSTEMI, s/p LAD stent   NEPHROLITHIASIS, HX OF 06/07/2008   STEMI (ST elevation myocardial infarction) ([place]) 04/27/2019    Assessment: 65 yo M s/p HM 3 and AVR on 04/06/21. Prior to surgery/admit patient was on apixiban for a DVT.  Pt s/p thora 12/8, 12/9, and 12/12. INR subtherapeutic after lower doses for that procedure. Started fixed heparin dose at 400 uts/hr started on 12/12 with heparin level daily. - stopped when INR >1.8   INR trended up above goal over weekend now stable to 2.9 >2.5> after holding 1 dose  Hgb 9-10s. No bleeding issues noted. LDH stable < 200.  Goal of Therapy:  Warfarin INR 2.0-3.0 Monitor platelets by anticoagulation protocol: Yes   Plan:  Warfarin 2.5 mg x1 tonight Completed aspirin regimen  Erin Hearing PharmD., BCPS Clinical Pharmacist 05/07/2021 8:01  AM

## 2021-05-07 NOTE — Plan of Care (Signed)
°  Problem: Education: Goal: Knowledge of General Education information will improve Description: Including pain rating scale, medication(s)/side effects and non-pharmacologic comfort measures Outcome: Progressing   Problem: Health Behavior/Discharge Planning: Goal: Ability to manage health-related needs will improve Outcome: Progressing   Problem: Clinical Measurements: Goal: Ability to maintain clinical measurements within normal limits will improve Outcome: Progressing Goal: Will remain free from infection Outcome: Progressing Goal: Diagnostic test results will improve Outcome: Progressing Goal: Respiratory complications will improve Outcome: Progressing Goal: Cardiovascular complication will be avoided Outcome: Progressing   Problem: Activity: Goal: Risk for activity intolerance will decrease Outcome: Progressing   Problem: Nutrition: Goal: Adequate nutrition will be maintained Outcome: Progressing   Problem: Coping: Goal: Level of anxiety will decrease Outcome: Progressing   Problem: Elimination: Goal: Will not experience complications related to bowel motility Outcome: Progressing Goal: Will not experience complications related to urinary retention Outcome: Progressing   Problem: Pain Managment: Goal: General experience of comfort will improve Outcome: Progressing   Problem: Safety: Goal: Ability to remain free from injury will improve Outcome: Progressing   Problem: Skin Integrity: Goal: Risk for impaired skin integrity will decrease Outcome: Progressing   Problem: Education: Goal: Ability to demonstrate management of disease process will improve Outcome: Progressing Goal: Ability to verbalize understanding of medication therapies will improve Outcome: Progressing Goal: Individualized Educational Video(s) Outcome: Progressing   Problem: Activity: Goal: Capacity to carry out activities will improve Outcome: Progressing   Problem: Cardiac: Goal:  Ability to achieve and maintain adequate cardiopulmonary perfusion will improve Outcome: Progressing   Problem: Education: Goal: Knowledge of the prescribed therapeutic regimen will improve Outcome: Progressing   Problem: Activity: Goal: Risk for activity intolerance will decrease Outcome: Progressing   Problem: Cardiac: Goal: Ability to maintain an adequate cardiac output will improve Outcome: Progressing   Problem: Coping: Goal: Level of anxiety will decrease Outcome: Progressing   Problem: Fluid Volume: Goal: Risk for excess fluid volume will decrease Outcome: Progressing   Problem: Clinical Measurements: Goal: Ability to maintain clinical measurements within normal limits will improve Outcome: Progressing Goal: Will remain free from infection Outcome: Progressing   Problem: Respiratory: Goal: Will regain and/or maintain adequate ventilation Outcome: Progressing

## 2021-05-07 NOTE — Progress Notes (Signed)
LVAD Dressing Change:  Existing VAD dressing removed and site care performed using sterile technique. Drive line exit site cleaned with Chlora prep applicators x 2, rinsed with saline, allowed to dry, and gauze dressing with Silver strip applied. Exit site healing and unincorporated, the velour is fully implanted at exit site. Slight redness, no tenderness, foul odor or rash noted. Scant amount of yellow drainage (looked the same as photo from Sarah's note 12/22). Drive line anchor re-applied, plus a second anchor below to avoid kinking. Continue twice a week dressing changes on Monday and Thursday per VAD Coordinator, Nurse Davonna Belling, or Tomi Bamberger.  Next dressing change due Thursday, 05/10/21.    Henreitta Leber, RN Nurse Davonna Belling [facility] 05/07/21 23:15

## 2021-05-08 LAB — PROTIME-INR
INR: 2.3 — ABNORMAL HIGH (ref 0.8–1.2)
Prothrombin Time: 25.2 seconds — ABNORMAL HIGH (ref 11.4–15.2)

## 2021-05-08 LAB — BASIC METABOLIC PANEL
Anion gap: 5 (ref 5–15)
BUN: 12 mg/dL (ref 8–23)
CO2: 28 mmol/L (ref 22–32)
Calcium: 8.5 mg/dL — ABNORMAL LOW (ref 8.9–10.3)
Chloride: 100 mmol/L (ref 98–111)
Creatinine, Ser: 0.62 mg/dL (ref 0.61–1.24)
GFR, Estimated: >60 mL/min (ref >60)
Glucose, Bld: 96 mg/dL (ref 70–99)
Potassium: 3.8 mmol/L (ref 3.5–5.1)
Sodium: 133 mmol/L — ABNORMAL LOW (ref 135–145)

## 2021-05-08 LAB — CBC
HCT: 30.9 % — ABNORMAL LOW (ref 39.0–52.0)
Hemoglobin: 9.1 g/dL — ABNORMAL LOW (ref 13.0–17.0)
MCH: 23.9 pg — ABNORMAL LOW (ref 26.0–34.0)
MCHC: 29.4 g/dL — ABNORMAL LOW (ref 30.0–36.0)
MCV: 81.1 fL (ref 80.0–100.0)
Platelets: 235 10*3/uL (ref 150–400)
RBC: 3.81 MIL/uL — ABNORMAL LOW (ref 4.22–5.81)
RDW: 19.9 % — ABNORMAL HIGH (ref 11.5–15.5)
WBC: 7 10*3/uL (ref 4.0–10.5)
nRBC: 0 % (ref 0.0–0.2)

## 2021-05-08 LAB — GLUCOSE, CAPILLARY
Glucose-Capillary: 93 mg/dL (ref 70–99)
Glucose-Capillary: 86 mg/dL (ref 70–99)
Glucose-Capillary: 106 mg/dL — ABNORMAL HIGH (ref 70–99)
Glucose-Capillary: 96 mg/dL (ref 70–99)
Glucose-Capillary: 122 mg/dL — ABNORMAL HIGH (ref 70–99)

## 2021-05-08 LAB — PREALBUMIN: Prealbumin: 15 mg/dL — ABNORMAL LOW (ref 18–38)

## 2021-05-08 LAB — LACTATE DEHYDROGENASE: LDH: 158 U/L (ref 98–192)

## 2021-05-08 LAB — BRAIN NATRIURETIC PEPTIDE: B Natriuretic Peptide: 349.4 pg/mL — ABNORMAL HIGH (ref 0.0–100.0)

## 2021-05-08 LAB — DIGOXIN LEVEL: Digoxin Level: 0.5 ng/mL — ABNORMAL LOW (ref 0.8–2.0)

## 2021-05-08 MED ORDER — SODIUM CHLORIDE 0.9 % IV SOLN: Freq: Once | INTRAVENOUS | Status: AC

## 2021-05-08 MED ORDER — WARFARIN SODIUM 2.5 MG PO TABS
2.5000 mg | ORAL_TABLET | Freq: Once | ORAL | Status: AC
  Administered 2021-05-08: 16:00:00 2.5 mg via ORAL
  Filled 2021-05-08: qty 1

## 2021-05-08 MED ORDER — POTASSIUM CHLORIDE CRYS ER 20 MEQ PO TBCR
40.0000 meq | EXTENDED_RELEASE_TABLET | Freq: Once | ORAL | Status: AC
  Administered 2021-05-08: 12:00:00 40 meq via ORAL
  Filled 2021-05-08: qty 2

## 2021-05-08 MED ORDER — FUROSEMIDE 10 MG/ML IJ SOLN
80.0000 mg | Freq: Once | INTRAMUSCULAR | Status: AC
  Administered 2021-05-08: 12:00:00 80 mg via INTRAVENOUS
  Filled 2021-05-08: qty 8

## 2021-05-08 NOTE — Progress Notes (Signed)
Occupational Therapy Treatment Patient Details Name: Brandon [name] MRN: [id number] DOB: 1956-04-01 Today's Date: 05/08/2021   History of present illness Pt is a 65 y.o. male admitted 03/07/21 with acute on chronic hypoxemic respiratory failure, cardiogenic shock. CT scan which shows bronchopneumonia and no indication of ILD. Prolonged admission undergoing workup for VAD. S/p LVAD placement 11/25. CXR 12/8 with significant bilateral pleural effusions. S/p L thoracentesis 12/8 and 12/12.  S/p R thoracentesis 12/9. Tested positive for COVID 12/18 after exposed by visitor. Other PMH includes CHF, ankylosing spondylitis, bacteremia and  possible endocarditis s/p ICD removal, HTN, depression, arthritis.   OT comments  Pt with pain, had just been given pain meds and was planning to take a nap. Agreed to practice management of LVAD equipment at bedside. Pt using written list as a guide and completed without assist. On second trial, used teach-back method with pt instructing OT in sequence. Pt is eager to go home.   Recommendations for follow up therapy are one component of a multi-disciplinary discharge planning process, led by the attending physician.  Recommendations may be updated based on patient status, additional functional criteria and insurance authorization.    Follow Up Recommendations  No OT follow up    Assistance Recommended at Discharge Frequent or constant Supervision/Assistance  Equipment Recommendations  BSC/3in1;Tub/shower seat    Recommendations for Other Services      Precautions / Restrictions Precautions Precautions: Fall;Sternal;Other (comment) Precaution Comments: LVAD, COVID Restrictions Weight Bearing Restrictions: No       Mobility Bed Mobility Overal bed mobility: Modified Independent                  Transfers                         Balance Overall balance assessment: Needs assistance Sitting-balance support: No upper extremity  supported;Feet supported Sitting balance-Leahy Scale: Normal                                     ADL either performed or assessed with clinical judgement   ADL                   Upper Body Dressing : Set up;Sitting Upper Body Dressing Details (indicate cue type and reason): LVAD vest                        Extremity/Trunk Assessment              Vision       Perception     Praxis      Cognition Arousal/Alertness: Awake/alert Behavior During Therapy: WFL for tasks assessed/performed Overall Cognitive Status: Impaired/Different from baseline Area of Impairment: Memory;Problem solving;Awareness                     Memory: Decreased short-term memory       Problem Solving: Difficulty sequencing;Requires verbal cues General Comments: Pt able to perform power source changes using written handout.          Exercises     Shoulder Instructions       General Comments      Pertinent Vitals/ Pain       Pain Assessment: Faces Faces Pain Scale: Hurts little more Pain Location: chest, uppper abdomen Pain Descriptors / Indicators: Aching Pain Intervention(s): Monitored during session;Premedicated before session  Home Living                                          Prior Functioning/Environment              Frequency  Min 2X/week        Progress Toward Goals  OT Goals(current goals can now be found in the care plan section)  Progress towards OT goals: Progressing toward goals  Acute Rehab OT Goals OT Goal Formulation: With patient Time For Goal Achievement: 05/07/21 Potential to Achieve Goals: Good  Plan Discharge plan remains appropriate    Co-evaluation                 AM-PAC OT "6 Clicks" Daily Activity     Outcome Measure   Help from another person eating meals?: None Help from another person taking care of personal grooming?: A Little Help from another person toileting,  which includes using toliet, bedpan, or urinal?: A Little Help from another person bathing (including washing, rinsing, drying)?: A Little Help from another person to put on and taking off regular upper body clothing?: A Little Help from another person to put on and taking off regular lower body clothing?: A Little 6 Click Score: 19    End of Session Equipment Utilized During Treatment: Oxygen  OT Visit Diagnosis: Unsteadiness on feet (R26.81);Other abnormalities of gait and mobility (R26.89);Muscle weakness (generalized) (M62.81)   Activity Tolerance Patient limited by fatigue;Patient limited by pain   Patient Left in bed;with call bell/phone within reach;with bed alarm set   Nurse Communication          Time: 4270-6237 OT Time Calculation (min): 21 min  Charges: OT General Charges $OT Visit: 1 Visit OT Treatments $Self Care/Home Management : 8-22 mins  Malka So 05/08/2021, 3:26 PM Nestor Lewandowsky, OTR/L Acute Rehabilitation Services Pager: [phone] Office: [phone]

## 2021-05-08 NOTE — Plan of Care (Signed)
°  Problem: Education: Goal: Knowledge of General Education information will improve Description: Including pain rating scale, medication(s)/side effects and non-pharmacologic comfort measures Outcome: Progressing   Problem: Health Behavior/Discharge Planning: Goal: Ability to manage health-related needs will improve Outcome: Progressing   Problem: Clinical Measurements: Goal: Ability to maintain clinical measurements within normal limits will improve Outcome: Progressing Goal: Will remain free from infection Outcome: Progressing Goal: Diagnostic test results will improve Outcome: Progressing Goal: Respiratory complications will improve Outcome: Progressing Goal: Cardiovascular complication will be avoided Outcome: Progressing   Problem: Activity: Goal: Risk for activity intolerance will decrease Outcome: Progressing   Problem: Nutrition: Goal: Adequate nutrition will be maintained Outcome: Progressing   Problem: Coping: Goal: Level of anxiety will decrease Outcome: Progressing   Problem: Elimination: Goal: Will not experience complications related to bowel motility Outcome: Progressing Goal: Will not experience complications related to urinary retention Outcome: Progressing   Problem: Pain Managment: Goal: General experience of comfort will improve Outcome: Progressing   Problem: Safety: Goal: Ability to remain free from injury will improve Outcome: Progressing   Problem: Skin Integrity: Goal: Risk for impaired skin integrity will decrease Outcome: Progressing   Problem: Education: Goal: Ability to demonstrate management of disease process will improve Outcome: Progressing Goal: Ability to verbalize understanding of medication therapies will improve Outcome: Progressing Goal: Individualized Educational Video(s) Outcome: Progressing   Problem: Activity: Goal: Capacity to carry out activities will improve Outcome: Progressing   Problem: Cardiac: Goal:  Ability to achieve and maintain adequate cardiopulmonary perfusion will improve Outcome: Progressing   Problem: Education: Goal: Knowledge of the prescribed therapeutic regimen will improve Outcome: Progressing   Problem: Activity: Goal: Risk for activity intolerance will decrease Outcome: Progressing   Problem: Cardiac: Goal: Ability to maintain an adequate cardiac output will improve Outcome: Progressing   Problem: Coping: Goal: Level of anxiety will decrease Outcome: Progressing   Problem: Fluid Volume: Goal: Risk for excess fluid volume will decrease Outcome: Progressing   Problem: Clinical Measurements: Goal: Ability to maintain clinical measurements within normal limits will improve Outcome: Progressing Goal: Will remain free from infection Outcome: Progressing   Problem: Respiratory: Goal: Will regain and/or maintain adequate ventilation Outcome: Progressing

## 2021-05-08 NOTE — Progress Notes (Signed)
ANTICOAGULATION CONSULT NOTE   Pharmacy Consult for warfarin  Indication:  s/p VAD (HM3) , afib, hx DVT  No Active Allergies  Patient Measurements: Height: 6' (182.9 cm) Weight: 76.3 kg (168 lb 3.4 oz) IBW/kg (Calculated) : 77.6   Vital Signs: Temp: 98.5 F (36.9 C) (12/27 1201) Temp Source: Oral (12/27 1201) BP: 125/89 (12/27 1201) Pulse Rate: 93 (12/27 1133)  Labs: Recent Labs    05/06/21 0359 05/07/21 0400 05/08/21 0345  HGB 9.5* 9.4* 9.1*  HCT 31.4* 30.5* 30.9*  PLT 238 223 235  LABPROT 26.7* 27.1* 25.2*  INR 2.5* 2.5* 2.3*  CREATININE 0.66 0.72 0.62     Estimated Creatinine Clearance: 96.7 mL/min (by C-G formula based on SCr of 0.62 mg/dL).   Medical History: Past Medical History:  Diagnosis Date   AICD (automatic cardioverter/defibrillator) present 08/31/2019   Ankylosing spondylitis ([place])    Arthritis    BENIGN PROSTATIC HYPERTROPHY 06/07/2008   CHF (congestive heart failure) ([place])    COLONIC POLYPS, HX OF 06/07/2008   Depression    H/O hiatal hernia    Heart failure ([place])    HYPERLIPIDEMIA 06/07/2008   HYPERTENSION 06/07/2008   Myocardial infarction [facility]) 2005   NSTEMI, s/p LAD stent   NEPHROLITHIASIS, HX OF 06/07/2008   STEMI (ST elevation myocardial infarction) ([place]) 04/27/2019    Assessment: 65 yo M s/p HM 3 and AVR on 04/06/21. Prior to surgery/admit patient was on apixiban for a DVT.  Pt s/p thora 12/8, 12/9, and 12/12. INR subtherapeutic after lower doses for that procedure. Started fixed heparin dose at 400 uts/hr started on 12/12 with heparin level daily. - stopped when INR >1.8.   INR therapeutic at 2.3 - trending down slightly but likely secondary to held dose on 12/24. Hgb 9.1, plt 235. LDH 158. No s/sx of bleeding.   Goal of Therapy:  Warfarin INR 2.0-3.0 Monitor platelets by anticoagulation protocol: Yes   Plan:  Warfarin 2.5 mg x1 tonight Completed aspirin regimen  Brandon [name], PharmD, BCCCP Clinical Pharmacist  Phone:  [phone] 05/08/2021 2:19 PM  Please check AMION for all [place] phone numbers After 10:00 PM, call [place] [phone]

## 2021-05-08 NOTE — Progress Notes (Signed)
Pt's doppler was in 60s and BP 89/71 with a MAP  of 78. PI is 6.3 and RN saw it as high as 8. PI events noted. Pt states he feels fine. RN spoke with LVAD coordinator who spoke with MD, Coordinator gave verbal orders from MD that RN can give 250 mL NS, which RN started. RN will continue to monitor.

## 2021-05-08 NOTE — Progress Notes (Addendum)
Patient ID: Brandon [name], male   DOB: 12-Mar-1956, 65 y.o.   MRN: [id number]      Advanced Heart Failure Rounding Note  PCP-Cardiologist: [name], MD  [facility]: Dr. [name]   Subjective:    Admitted with sepsis. Started on cefepime + vanc, now off abx (ID following). Afebrile.  10/29 Co-ox 48%, started on milrinone 0.25.  10/30 IV Antibiotics stopped. Blood cultures no growth for 5 days.  10/31 started on Augmentin for PNA.  11/1 Milrinone off 11/2 Milrinone added back. Midodrine increased to 10 mg TID 11/10 Developed Afib w/ RVR, converted with IV amio 11/14 Milrinone increased to 0.25 mcg. Diuresed with IV lasix  11/25 Underwent HM-3 VAD placement and bioprosthetic AVR. Had persistent hypotension in OR requiring methylene blue and high dose pressors.  11/26 Extubated 11/28 Started on lasix gtt>>later stopped due to low CVP (1) and increasing pressor requirements. Restarted on scheduled BID IV Lasix once pressure stabilized (still volume up)  11/28 Surgical path with evidence of possible "acute AoV endocarditis". ID consulted. Bcx recollected. Tissue sent to [facility] in [place].   11/30 Started on IV ceftriaxone to cover the Streptococcus gordonae that was isolated before, per ID. 11/30 VAD speed increased to 5600 12/1 Diuresed with IV lasix + metolazone. Milrinone was stopped. Amio drip transitioned to amio 200 mg twice a day.  12/5 Ramp Echo speed increased to 5800. Epi stopped.  12/6 Transferred to [place]. Had BM. Started on spiro 12.5 mg daily 12/8 CXR with significant bilateral pleural effusions. S/p Left Thoracentesis w/ 1.7 L out but still with residual fluid 12/9 Rt Thoracentesis  12/11 Given dose of tolvaptan and diuresed with IV lasix. Negative 2.5 liters.  12/12 Repeat Lt Thoracentesis w/ 1L Fluid removal  12/18 + SARS-CoV-2  Given IV Lasix yesterday for LEE. BNP 156>>349.  3.8L in UOP. Net negative 2.8 L for the day. Wt down 2 lb. SCr stable. K 3.8   Feet still swollen. No  dyspnea. Feels more tired today. Some mild pleuritic, sternal chest pain w/ deep breathing. No fever/ chills.   MAPs 80s.  Reports his sister and neighbor, whom will serve as caregivers, are still symptomatic from [place].    LVAD Interrogation HM 3: Speed: 5800 Flow: 4.6 PI: 4.5 Power: 4.7. multiple PI events  VAD interrogated personally. Parameters stable.   Objective:   Weight Range: 76.3 kg Body mass index is 22.81 kg/m.   Vital Signs:   Temp:  [98.2 F (36.8 C)-98.8 F (37.1 C)] 98.8 F (37.1 C) (12/27 0336) Pulse Rate:  [87-98] 90 (12/27 0336) Resp:  [18-20] 20 (12/27 0336) BP: (87-107)/(65-86) 87/78 (12/27 0336) SpO2:  [99 %-100 %] 99 % (12/27 0336) Weight:  [76.3 kg] 76.3 kg (12/27 0500) Last BM Date: 05/07/21  Weight change: Filed Weights   05/07/21 0517 05/08/21 0049 05/08/21 0500  Weight: 77.4 kg 76.3 kg 76.3 kg    Intake/Output:   Intake/Output Summary (Last 24 hours) at 05/08/2021 0805 Last data filed at 05/08/2021 0336 Gross per 24 hour  Intake 980 ml  Output 3750 ml  Net -2770 ml     Physical Exam   General:  sitting up in chair, fatigued looking. No distress  HEENT: normal  Neck: supple. JVD ~8 cm  Carotids 2+ bilat; no bruits. No lymphadenopathy or thryomegaly appreciated. Cor: LVAD hum.  Lungs: CTAB. No wheezing  Abdomen: soft, nontender, non-distended. No hepatosplenomegaly. No bruits or masses. Good bowel sounds. Driveline site clean. Anchor in place.  Extremities: no  cyanosis, clubbing, rash. Warm 2+ bilateral pedal/ankle edema, + TED hoses  Neuro: alert & oriented x 3. No focal deficits. Moves all 4 without problem    Telemetry   SR 70-80s, 4 beat run NSVT  Personally reviewed  Labs    CBC Recent Labs    05/07/21 0400 05/08/21 0345  WBC 8.1 7.0  HGB 9.4* 9.1*  HCT 30.5* 30.9*  MCV 80.9 81.1  PLT 223 778   Basic Metabolic Panel Recent Labs    05/07/21 0400 05/08/21 0345  NA 133* 133*  K 3.8 3.8  CL 97* 100  CO2 29  28  GLUCOSE 90 96  BUN 13 12  CREATININE 0.72 0.62  CALCIUM 8.5* 8.5*   Liver Function Tests No results for input(s): AST, ALT, ALKPHOS, BILITOT, PROT, ALBUMIN in the last 72 hours.    No results for input(s): LIPASE, AMYLASE in the last 72 hours. Cardiac Enzymes No results for input(s): CKTOTAL, CKMB, CKMBINDEX, TROPONINI in the last 72 hours.  BNP: BNP (last 3 results) Recent Labs    04/26/21 0420 05/04/21 0410 05/08/21 0345  BNP 273.9* 156.7* 349.4*    ProBNP (last 3 results) No results for input(s): PROBNP in the last 8760 hours.    D-Dimer No results for input(s): DDIMER in the last 72 hours. Hemoglobin A1C No results for input(s): HGBA1C in the last 72 hours.  Fasting Lipid Panel No results for input(s): CHOL, HDL, LDLCALC, TRIG, CHOLHDL, LDLDIRECT in the last 72 hours. Thyroid Function Tests No results for input(s): TSH, T4TOTAL, T3FREE, THYROIDAB in the last 72 hours.  Invalid input(s): FREET3  Other results:   Imaging    No results found.   Medications:     Scheduled Medications:  (feeding supplement) PROSource Plus  30 mL Oral TID PC & HS   sodium chloride   Intravenous Once   amiodarone  200 mg Oral Daily   vitamin C  500 mg Oral Daily   atorvastatin  80 mg Oral Daily   Chlorhexidine Gluconate Cloth  6 each Topical Daily   digoxin  0.125 mg Oral Daily   doxycycline  100 mg Oral Q12H   eplerenone  25 mg Oral Daily   ezetimibe  10 mg Oral Daily   feeding supplement  237 mL Oral TID BM   folic acid  1 mg Oral Daily   gabapentin  300 mg Oral TID   Gerhardt's butt cream   Topical Daily   insulin aspart  0-15 Units Subcutaneous TID WC   losartan  25 mg Oral Daily   mouth rinse  15 mL Mouth Rinse BID   melatonin  5 mg Oral QHS   multivitamin with minerals  1 tablet Oral Q lunch   pantoprazole  40 mg Oral Daily   sertraline  100 mg Oral Daily   sodium chloride flush  10-40 mL Intracatheter Q12H   tamsulosin  0.4 mg Oral Daily   thiamine   100 mg Oral Daily   vitamin B-12  100 mcg Oral Daily   Warfarin - Pharmacist Dosing Inpatient   Does not apply q1600    Infusions:  sodium chloride Stopped (04/14/21 1149)   cefTRIAXone (ROCEPHIN)  IV 2 g (05/07/21 2423)   lactated ringers Stopped (04/13/21 1053)     PRN Medications: sodium chloride, acetaminophen, bisacodyl **OR** bisacodyl, lidocaine, morphine injection, ondansetron (ZOFRAN) IV, oxyCODONE, polyvinyl alcohol, sodium chloride flush, sodium chloride flush, traMADol    Patient Profile   Mr Brandis is a 65  year old with history of smoker, CAD s/p previous MI, HTN, HL, chronic HFrEF, sepsis 11/2020 bacteremia.  Admitted with sepsis. Bld Cx - NGTD    Assessment/Plan   1. Acute on chronic systolic HFr EF -> cardiogenic shock - Echo 05/19/20 EF 20-25% RV mildly HK. moderate AS  Mean gradient 13 AVA 1.2 cm2 DI 0.30 - Persistent NYHA IV. Admitted with shock - s/p HM-III VAD + bioprosthetic AVR on 11/25 - RV looked good on TEE in OR - Continue digoxin 0.125. Dig level ok (0.5)  - continue Losartan 25 mg daily - Continue eplerenone 25 daily - Volume was low. Got IVF bolus on 12/23 and 12/24.  - Now w/ mild volume overload. 2+ edema today. Continue IV Lasix 80 mg today. Will need some torsemide on d/c - Stable continue current regimen  2. HM-3 LVAD implant - VAD interrogated personally. Parameters stable. - ASA stopped 12/26 - INR 2.3. Goal 2.0-3.0  PharmD following - LDH 158  - MAPs 70-80s  - Had ramp ECHO speed increased to 5800.   3. Acute Hypoxic Respiratory Failure  - Extubated 11/26, stable on 2L [place].    4. 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 CAD  s/p anterior STEMI (12/20). LHC showed chronically occluded RCA (with L>>R collaterals) and thrombotic occlusion of proximal LAD. Underwent PCI of LAD. - No s/s angina - Continue atorvastatin 80.  - Continue  ASA until 12/25 d/t VAD  6. Iron-deficiency anemia & ABLA - Rec'd Feraheme 11/8 and 11/13  - Rec'd 1 unit PRBCs on 12/10 - Keep hgb > 8.0,  9.1 today  - 04/23/21 Iron sats low. Given feraheme  7. Hypokalemia/ Hypomagnesemia  - Keep K > 4.0 Mg > 2.0  - K 3.8 - Supp today w/ IV Lasix   8. Severe protein-calorie malnutrition - prealbumin 11>15> 13 >6.6>15  - Continue aggressive nutritional support  9. Paroxysmal Atrial Fibrillation w/ RVR - new, developed 11/10. Maintaining SR/ST    - Off amio drip. Continue amio 200 mg once daily. Stop as outpatient - on warfarin. INR 2.3 today.   10. Hyponatremia - Na 133 -> 130->133 today - tolvaptan on 12/11.  - monitor   11. Sepsis - H/o strep bacteremia 11/2020.  ICD extracted 12/04/20. Barostim was not removed as it is extravascular.  - Received IV antibiotics until 01/15/21. Blood cultures negative 01/30/21..  - 10/26 -Blood CX x2  Negative  - Ct chest concerning for pneumonia. 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   PCT < 0.1, WBCs 7, afebrile.  Cultures NGTD,  vancomycin stopped.   - 12/18 + SARS-CoV-2. See below.   12. Hemorrhagic Pericarditis - Fibrin clot encasing heart at time of surgery.   - No effusion on f/u echo  13. 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 - Ceftriaxone 2 g IV daily x 6 weeks. End Date: May 22, 2021 - Home IV abx management has been arranged with [facility] and [place] line placed 12/22  14. Constipation - Resolved.    15. Bilateral pleural effusions - S/p Left thoracentesis 12/8 but still with residual fluid. Will likely need repeat L tap in a few days - R thoracentesis 12/9.  - S/p repeat left thoracentesis 12/12 w/ 1L Fluid removal  - Repeat CXR 12/15 w/ small b/l effusions.  - Lungs okay on exam. O2 sats stable. - resolved  16. SARS-CoV-2 -Completed course of Remdesivir  Disposition: -Anticipate discharge once he has caregiver support at home. (Recovering from [place]) -Will need home antibiotics, already arranged with [facility] and [place]  Length of Stay: [address], PA-C  05/08/2021, 8:05 AM  Advanced Heart Failure Team Pager [phone] (M-F; 7a - 5p)  Please contact [facility] for night-coverage after hours (5p -7a ) and weekends on [email or web address]  Patient seen with PA, agree with the above note.   Good diuresis yesterday, I/Os negative.  Has some pain at surgical site central chest wall.  Otherwise, no complaints. Not coughing.   General: Well appearing this am. NAD.  HEENT: Normal. Neck: Supple, JVP 8 cm. Carotids OK.  Cardiac:  Mechanical heart sounds with LVAD hum present.  Lungs:  CTAB, normal effort.  Abdomen:  NT, ND, no HSM. No bruits or masses. +BS  LVAD exit site: Well-healed and incorporated. Dressing dry and intact. No erythema or drainage. Stabilization device present and accurately applied. Driveline dressing changed daily per sterile technique. Extremities:  Warm and dry. No cyanosis, clubbing, rash. 1+ ankle edema.  Neuro:  Alert & oriented x 3. Cranial nerves grossly intact. Moves all 4 extremities w/o difficulty. Affect pleasant    Volume status improving, will give 1 more dose of Lasix 80 mg IV today, will need to transition  to torsemide tomorrow.   INR therapeutic on warfarin.    Ceftriaxone to continue to 05/22/21.   Waiting for caregiver at home to recover from [place].  He can go home when caregiver is ready.   Loralie Champagne 05/08/2021 9:00 AM

## 2021-05-08 NOTE — Progress Notes (Signed)
Nutrition Follow-up  DOCUMENTATION CODES:   Severe malnutrition in context of chronic illness  INTERVENTION:  - Continue Ensure Enlive po TID, each supplement provides 350 kcal and 20 grams of protein   - Continue ProSource Plus 30 ml po QID, each supplement provides 100 kcal and 15 grams of protein   - Continue MVI with minerals daily   - Continue with liberalized Regular diet  NUTRITION DIAGNOSIS:   Severe Malnutrition related to chronic illness (CHF) as evidenced by severe muscle depletion, severe fat depletion.  Ongoing, being addressed via oral nutrition supplements and liberalized diet  GOAL:   Patient will meet greater than or equal to 90% of their needs  Goal met  MONITOR:   PO intake, Supplement acceptance, Labs, Weight trends, I & O's  REASON FOR ASSESSMENT:   Consult Enteral/tube feeding initiation and management, Assessment of nutrition requirement/status  ASSESSMENT:   65 y.o. male presented to the ED with sudden onset of dyspnea. PMH includes CAD, CHF with ICD, HTN, and on 2L [place] at baseline. Pt admitted with respiratory failure with hypoxia with SIRS.  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 Noted PO intake has improved and is noted to be eating 90-100% of meals. D/c pending, home caregiver with symptomatic COVID.   Admit weight: 76.9 kg on 10/26 Current weight: 76.3 kg   Pt with mild volume overload. Started on IV lasix yesterday. Plans for 1 more dose of Lasix and then transition to torsemide tomorrow. 3.8L in UOP. Net negative 2.8 L for the day.    Medications  reviewed and include: ProSource Plus 30 ml QID, vitamin C 500 mg daily, Ensure Enlive TID, folic acid, lasix, SSI, melatonin, MVI with minerals daily, protonix, thiamine, vitamin B-12, warfarin, IV abx   Labs reviewed: sodium 133, hemoglobin 9.1 CBG's: 83-146 x 24 hours   UOP: 3750 ml x 24 hours I/O's: -12.4 L since admit  Diet Order:   Diet Order             Diet regular Room service appropriate? Yes; Fluid consistency: Thin  Diet effective now                   EDUCATION NEEDS:   Education needs have been addressed  Skin:  Skin Assessment: Skin Integrity Issues: Skin Integrity Issues:: Stage I, Incisions Stage I: coccyx Incisions: chest, L abdomen  Last BM:  05/07/21  Height:   Ht Readings from Last 1 Encounters:  05/07/21 6' (1.829 m)    Weight:   Wt Readings from Last 1 Encounters:  05/08/21 76.3 kg    Ideal Body Weight:  80.9 kg  BMI:  Body mass index is 22.81 kg/m.  Estimated Nutritional Needs:   Kcal:  0981-1914  Protein:  115-130 grams  Fluid:  >/= 2 L  Clayborne Dana, RDN, LDN Clinical Nutrition

## 2021-05-08 NOTE — Progress Notes (Signed)
LVAD Coordinator Rounding Note:  Admitted 03/07/21 due to sepsis. Dr. [name] consulted as his HF Cardiologist.   HM III LVAD implanted on 04/06/21 by Dr. Cyndia Bent under Destination Therapy criteria. Not a transplant candidate at this time due to recurrent infections.   Pt laying in bed this morning. Covid restrictions remain in place. Pt reports he is feeling poorly this morning, with cough, fatigue, and sternal discomfort when he coughs. Reports pain in his feet due to swelling. TED hose in place. Plan for Lasix 80 mg IV this morning per Dr Aundra Dubin.   I spoke with both Patty and [name] regarding possible pt discharge home. Both are recovering from [place]. Per discussion with caregivers, and pt self report of not feeling well will defer discharge home until tomorrow. Carolynn Sayers made aware of possible discharge tomorrow.   Vital signs: Temp: 98.8 HR: 88 Doppler: 84 Automatic BP: 106/80 (89) O2 Sat: 100% on RA Wt:171.5>190>187.4>187>...174.3>172.4>175.4>175>174>167>166>163>163.5>168.2 lbs     LVAD interrogation reveals:  Speed: 5800 Flow: 4.8 Power: 4.8w PI: 4.3  Alarms: none Events: 16 PI events so far today Hematocrit: 35  Fixed speed: 5800 Low speed limit: 5500  Drive Line:  Existing VAD dressing clean, dry, intact. Drive line anchor correctly applied. Continue twice a week dressing changes on Monday and Thursday per VAD Coordinator, Nurse Davonna Belling, or Tomi Bamberger. Next dressing change due 05/10/21.   Labs:  LDH trend: 309>248>215>206>201.....152>158>158>144>174>157>170>158  INR trend: 1.6>1.3>1.3>1.4.....1.8>1.7>1.4>1.5>1.3>1.4>1.5>1.4>2.0>2.2>2.3   Anticoagulation Plan: -INR Goal: 2 - 2.5 -ASA Dose: 81 mg daily  Blood Products:   Intra-op: - 04/06/21>>4 PRBCs, 4 FFP, 1263 cc cell saver  Post-op: - 04/06/21>>1 unit Plts - 04/07/21>>1 unit PRBC  Device: N/A  Arrythmias: 03/22/21 - developed Afib w/ RVR, converted with IV amiodarone  Respiratory: extubated  04/07/21  Nitric Oxide: off 04/07/21  Gtts: Heparin 400 u/hr-off  Infection: - 04/11/21 Blood cultures x 2>> no growth x 5 days; final - 04/19/21 Body fluid cx (left lung)>> no growth <24 hours - 04/19/21 Gram stain (left lung)>> no WBC or organisms seen; final - 04/20/21 Gram stain (right lung)>> rare WBC, no organisms seen; final - 04/27/21 blood culture x 2>> negative - 04/27/21 Sputum>> negative - 04/27/21 Urine>>negative - 04/29/21 Covid>>positive   Patient/Family Teaching:  VAD discharge teaching completed with patient, his sister [name], and his friend Tomi Bamberger. See separate note for documentation.  [name] able to do dressing changes with bedside nurse.  Plan/Recommendations:  1. Call VAD Coordinator for any VAD equipment or drive line issues. 2.  Twice a week dressing changes per VAD Coordinator, Nurse Davonna Belling, or trained caregiver. 3. VAD discharge teaching completed with patient and family.  4. Possible discharge home tomorrow  5. Will deliver home VAD equipment on day of discharge.  6. F/U VAD clinic appointment made for next week.  Emerson Monte RN [place] Coordinator  Office: [phone]  24/7 Pager: [phone]

## 2021-05-09 ENCOUNTER — Other Ambulatory Visit (HOSPITAL_COMMUNITY): Payer: Self-pay

## 2021-05-09 ENCOUNTER — Encounter (HOSPITAL_COMMUNITY): Payer: Medicare Other

## 2021-05-09 ENCOUNTER — Telehealth (HOSPITAL_COMMUNITY): Payer: Self-pay | Admitting: Cardiology

## 2021-05-09 LAB — BASIC METABOLIC PANEL
Anion gap: 7 (ref 5–15)
BUN: 12 mg/dL (ref 8–23)
CO2: 29 mmol/L (ref 22–32)
Calcium: 8.3 mg/dL — ABNORMAL LOW (ref 8.9–10.3)
Chloride: 100 mmol/L (ref 98–111)
Creatinine, Ser: 0.56 mg/dL — ABNORMAL LOW (ref 0.61–1.24)
GFR, Estimated: >60 mL/min (ref >60)
Glucose, Bld: 90 mg/dL (ref 70–99)
Potassium: 3.6 mmol/L (ref 3.5–5.1)
Sodium: 136 mmol/L (ref 135–145)

## 2021-05-09 LAB — GLUCOSE, CAPILLARY
Glucose-Capillary: 84 mg/dL (ref 70–99)
Glucose-Capillary: 100 mg/dL — ABNORMAL HIGH (ref 70–99)

## 2021-05-09 LAB — CBC
HCT: 30.1 % — ABNORMAL LOW (ref 39.0–52.0)
Hemoglobin: 9.2 g/dL — ABNORMAL LOW (ref 13.0–17.0)
MCH: 24.7 pg — ABNORMAL LOW (ref 26.0–34.0)
MCHC: 30.6 g/dL (ref 30.0–36.0)
MCV: 80.9 fL (ref 80.0–100.0)
Platelets: 207 10*3/uL (ref 150–400)
RBC: 3.72 MIL/uL — ABNORMAL LOW (ref 4.22–5.81)
RDW: 19.9 % — ABNORMAL HIGH (ref 11.5–15.5)
WBC: 5.6 10*3/uL (ref 4.0–10.5)
nRBC: 0 % (ref 0.0–0.2)

## 2021-05-09 LAB — PROTIME-INR
INR: 2 — ABNORMAL HIGH (ref 0.8–1.2)
Prothrombin Time: 23 seconds — ABNORMAL HIGH (ref 11.4–15.2)

## 2021-05-09 LAB — MAGNESIUM: Magnesium: 1.7 mg/dL (ref 1.7–2.4)

## 2021-05-09 LAB — LACTATE DEHYDROGENASE: LDH: 146 U/L (ref 98–192)

## 2021-05-09 MED ORDER — TORSEMIDE 20 MG PO TABS
20.0000 mg | ORAL_TABLET | Freq: Every day | ORAL | Status: DC
  Administered 2021-05-09: 10:00:00 20 mg via ORAL
  Filled 2021-05-09: qty 1

## 2021-05-09 MED ORDER — LOSARTAN POTASSIUM 25 MG PO TABS
12.5000 mg | ORAL_TABLET | Freq: Every day | ORAL | Status: DC
  Administered 2021-05-09: 10:00:00 12.5 mg via ORAL
  Filled 2021-05-09: qty 1

## 2021-05-09 MED ORDER — POTASSIUM CHLORIDE CRYS ER 20 MEQ PO TBCR
30.0000 meq | EXTENDED_RELEASE_TABLET | Freq: Once | ORAL | Status: AC
  Administered 2021-05-09: 10:00:00 30 meq via ORAL
  Filled 2021-05-09: qty 1

## 2021-05-09 MED ORDER — TRAMADOL HCL 50 MG PO TABS
50.0000 mg | ORAL_TABLET | Freq: Four times a day (QID) | ORAL | 0 refills | Status: DC | PRN
  Filled 2021-05-09: qty 30, 7d supply, fill #0

## 2021-05-09 MED ORDER — MAGNESIUM SULFATE 4 GM/100ML IV SOLN
4.0000 g | Freq: Once | INTRAVENOUS | Status: AC
  Administered 2021-05-09: 10:00:00 4 g via INTRAVENOUS
  Filled 2021-05-09: qty 100

## 2021-05-09 MED ORDER — GABAPENTIN 300 MG PO CAPS
300.0000 mg | ORAL_CAPSULE | Freq: Three times a day (TID) | ORAL | 5 refills | Status: DC
  Filled 2021-05-09: qty 90, 30d supply, fill #0
  Filled 2021-08-30: qty 90, 30d supply, fill #1
  Filled 2021-09-28: qty 90, 30d supply, fill #0
  Filled 2021-10-27: qty 90, 30d supply, fill #1
  Filled 2021-11-23: qty 90, 30d supply, fill #2
  Filled 2021-12-24: qty 90, 30d supply, fill #3

## 2021-05-09 MED ORDER — DOXYCYCLINE HYCLATE 100 MG PO TABS
100.0000 mg | ORAL_TABLET | Freq: Two times a day (BID) | ORAL | 0 refills | Status: DC
  Filled 2021-05-09: qty 82, 41d supply, fill #0

## 2021-05-09 MED ORDER — SERTRALINE HCL 100 MG PO TABS
100.0000 mg | ORAL_TABLET | Freq: Every day | ORAL | 2 refills | Status: DC
  Filled 2021-05-09: qty 30, 30d supply, fill #0
  Filled 2021-06-15: qty 30, 30d supply, fill #1
  Filled 2021-08-31: qty 30, 30d supply, fill #2

## 2021-05-09 MED ORDER — LOSARTAN POTASSIUM 25 MG PO TABS
12.5000 mg | ORAL_TABLET | Freq: Every day | ORAL | 5 refills | Status: DC
  Filled 2021-05-09: qty 30, 60d supply, fill #0
  Filled 2021-06-22: qty 30, 60d supply, fill #1
  Filled 2021-08-03: qty 30, 60d supply, fill #2
  Filled 2021-09-28: qty 30, 60d supply, fill #0
  Filled 2021-11-23: qty 30, 60d supply, fill #1
  Filled 2022-01-20: qty 30, 60d supply, fill #2

## 2021-05-09 MED ORDER — WARFARIN SODIUM 2.5 MG PO TABS
ORAL_TABLET | ORAL | 11 refills | Status: DC
  Filled 2021-05-09: qty 45, 30d supply, fill #0

## 2021-05-09 MED ORDER — WARFARIN SODIUM 2.5 MG PO TABS
2.5000 mg | ORAL_TABLET | Freq: Every day | ORAL | 11 refills | Status: DC
  Filled 2021-05-09: qty 30, 30d supply, fill #0

## 2021-05-09 MED ORDER — TORSEMIDE 20 MG PO TABS
20.0000 mg | ORAL_TABLET | Freq: Every day | ORAL | 5 refills | Status: DC
  Filled 2021-05-09: qty 30, 30d supply, fill #0

## 2021-05-09 MED ORDER — WARFARIN SODIUM 5 MG PO TABS
5.0000 mg | ORAL_TABLET | Freq: Once | ORAL | Status: AC
  Administered 2021-05-09: 10:00:00 5 mg via ORAL
  Filled 2021-05-09: qty 1

## 2021-05-09 NOTE — Telephone Encounter (Signed)
Order to discontinue life vest called into [phone]  Rep reports the have the account noted will work on pick up equipment   Fax request shredded

## 2021-05-09 NOTE — TOC Transition Note (Addendum)
Transition of Care [facility]) - CM/SW Discharge Note   Patient Details  Name: Brandon [name] MRN: [id number] Date of Birth: November 03, 1955  Transition of [facility] CM/SW Contact:  Angelita Ingles, RN Phone Number:[phone]  05/09/2021, 9:35 AM   Clinical Narrative:    TOC following patient scheduled for d/cv today. CM spoke with Pam at [facility] to verify that patient is setup for outpatient antibiotic therapy. Pam has verified that patient is set up and that patient will need a dose of antibiotics prior to d/c. Secure chat has been sent to nurse. Nurse verified that patient has received dose of antibiotics. CM verified HH has been set up with Ramond Marrow at [facility]. Orders for [facility] have been entered. No other TOC needs noted at this time. TOC will sign off.    Final next level of care: [place] Barriers to Discharge: No Barriers Identified   Patient Goals and CMS Choice Patient states their goals for this hospitalization and ongoing recovery are:: wants to remain independent CMS Medicare.gov Compare Post Acute Care list provided to:: Patient Choice offered to / list presented to : Patient  Discharge Placement                       Discharge Plan and Services In-house Referral: Clinical Social Work Discharge Planning Services: CM Consult Post Acute Care Choice: Home Health                    HH Arranged: RN, PT, Nurse's Aide [place] Agency: [facility] ([facility]) Date HH Agency Contacted: 05/09/21 Time [place]: [phone] Representative spoke with at [place]: Spoke with Ramond Marrow to verify that patient has been previously set up orderes have been entered  Social Determinants of Health (SDOH) Interventions     Readmission Risk Interventions No flowsheet data found.

## 2021-05-09 NOTE — Progress Notes (Signed)
LVAD Coordinator Rounding Note:  Admitted 03/07/21 due to sepsis. Dr. [name] consulted as his HF Cardiologist.   HM III LVAD implanted on 04/06/21 by Dr. Cyndia Bent under Destination Therapy criteria. Not a transplant candidate at this time due to recurrent infections.   Pt laying in bed this morning. Covid restrictions remain in place. Pt reports he is feeling much better today. He is excited about going home today. His friend [name] is planning to pick him up this afternoon. Home equipment delivered to bedside. See separate discharge note for further details.   VAD clinic follow up appt scheduled 05/18/21 at 0900.   Carolynn Sayers aware of plan for discharge today. Pt has administered IV antibiotics independently at home prior to VAD.   Vital signs: Temp: 97.6 HR: 86 Doppler: 74 Automatic BP: 104/84 (92) O2 Sat: 98% on RA Wt:171.5>190>187.4>187>...174.3>172.4>175.4>175>174>167>166>163>163.5>168.2>165.2> 165.5 lbs    LVAD interrogation reveals:  Speed: 5800 Flow: 4.8 Power: 4.6w PI: 4.0  Alarms: none Events: 24 PI events so far today Hematocrit: 35  Fixed speed: 5800 Low speed limit: 5500  Drive Line:  Existing VAD dressing clean, dry, intact. Drive line anchor correctly applied. Continue twice a week dressing changes on Monday and Thursday per VAD Coordinator, Nurse Davonna Belling, or Tomi Bamberger. Next dressing change due 05/10/21.   Labs:  LDH trend: 309>248>215>206>201.....[phone]  INR trend: 1.6>1.3>1.3>1.4.....1.8>1.7>1.4>1.5>1.3>1.4>1.5>1.4>2.0>2.2>2.3>2.0   Anticoagulation Plan: -INR Goal: 2 - 2.5 -ASA Dose: 81 mg daily  Blood Products:   Intra-op: - 04/06/21>>4 PRBCs, 4 FFP, 1263 cc cell saver  Post-op: - 04/06/21>>1 unit Plts - 04/07/21>>1 unit PRBC  Device: N/A  Arrythmias: 03/22/21 - developed Afib w/ RVR, converted with IV amiodarone  Respiratory: extubated 04/07/21  Nitric Oxide: off 04/07/21  Gtts: Heparin 400  u/hr-off  Infection: - 04/11/21 Blood cultures x 2>> no growth x 5 days; final - 04/19/21 Body fluid cx (left lung)>> no growth <24 hours - 04/19/21 Gram stain (left lung)>> no WBC or organisms seen; final - 04/20/21 Gram stain (right lung)>> rare WBC, no organisms seen; final - 04/27/21 blood culture x 2>> negative - 04/27/21 Sputum>> negative - 04/27/21 Urine>>negative - 04/29/21 Covid>>positive   Patient/Family Teaching:  VAD discharge teaching completed with patient, his sister [name], and his friend [name]. See separate note for documentation.  [name] able to do dressing changes independently.  Plan/Recommendations:  1. Call VAD Coordinator for any VAD equipment or drive line issues. 2.  Twice a week dressing changes per VAD Coordinator, Nurse Davonna Belling, or trained caregiver. 3. VAD discharge teaching completed with patient and family.  4. Plan to discharge home today after delivery of home medications, and IV medication administration education. 5. Home VAD equipment delivered to bedside. 6. F/U VAD clinic appointment made for next week.  Emerson Monte RN [place] Coordinator  Office: [phone]  24/7 Pager: [phone]

## 2021-05-09 NOTE — Discharge Instructions (Signed)
Information on my medicine - Coumadin   (Warfarin)  Why was Coumadin prescribed for you? Coumadin was prescribed for you because you have a blood clot or a medical condition that can cause an increased risk of forming blood clots. Blood clots can cause serious health problems by blocking the flow of blood to the heart, lung, or brain. Coumadin can prevent harmful blood clots from forming. As a reminder your indication for Coumadin is:  Blood Clot Prevention after Heart Pump Surgery  What test will check on my response to Coumadin? While on Coumadin (warfarin) you will need to have an INR test regularly to ensure that your dose is keeping you in the desired range. The INR (international normalized ratio) number is calculated from the result of the laboratory test called prothrombin time (PT).  If an INR APPOINTMENT HAS NOT ALREADY BEEN MADE FOR YOU please schedule an appointment to have this lab work done by your health care provider within 7 days. Your INR goal is usually a number between:  2 to 3 or your provider may give you a more narrow range like 2-2.5.  Ask your health care provider during an office visit what your goal INR is.  What  do you need to  know  About  COUMADIN? Take Coumadin (warfarin) exactly as prescribed by your healthcare provider about the same time each day.  DO NOT stop taking without talking to the doctor who prescribed the medication.  Stopping without other blood clot prevention medication to take the place of Coumadin may increase your risk of developing a new clot or stroke.  Get refills before you run out.  What do you do if you miss a dose? If you miss a dose, take it as soon as you remember on the same day then continue your regularly scheduled regimen the next day.  Do not take two doses of Coumadin at the same time.  Important Safety Information A possible side effect of Coumadin (Warfarin) is an increased risk of bleeding. You should call your healthcare  provider right away if you experience any of the following: Bleeding from an injury or your nose that does not stop. Unusual colored urine (red or dark brown) or unusual colored stools (red or black). Unusual bruising for unknown reasons. A serious fall or if you hit your head (even if there is no bleeding).  Some foods or medicines interact with Coumadin (warfarin) and might alter your response to warfarin. To help avoid this: Eat a balanced diet, maintaining a consistent amount of Vitamin K. Notify your provider about major diet changes you plan to make. Avoid alcohol or limit your intake to 1 drink for women and 2 drinks for men per day. (1 drink is 5 oz. wine, 12 oz. beer, or 1.5 oz. liquor.)  Make sure that ANY health care provider who prescribes medication for you knows that you are taking Coumadin (warfarin).  Also make sure the healthcare provider who is monitoring your Coumadin knows when you have started a new medication including herbals and non-prescription products.  Coumadin (Warfarin)  Major Drug Interactions  Increased Warfarin Effect Decreased Warfarin Effect  Alcohol (large quantities) Antibiotics (esp. Septra/Bactrim, Flagyl, Cipro) Amiodarone (Cordarone) Aspirin (ASA) Cimetidine (Tagamet) Megestrol (Megace) NSAIDs (ibuprofen, naproxen, etc.) Piroxicam (Feldene) Propafenone (Rythmol SR) Propranolol (Inderal) Isoniazid (INH) Posaconazole (Noxafil) Barbiturates (Phenobarbital) Carbamazepine (Tegretol) Chlordiazepoxide (Librium) Cholestyramine (Questran) Griseofulvin Oral Contraceptives Rifampin Sucralfate (Carafate) Vitamin K   Coumadin (Warfarin) Major Herbal Interactions  Increased Warfarin Effect Decreased Warfarin  Effect  Garlic Ginseng Ginkgo biloba Coenzyme Q10 Green tea St. Johns wort    Coumadin (Warfarin) FOOD Interactions  Eat a consistent number of servings per week of foods HIGH in Vitamin K (1 serving =  cup)  Collards (cooked, or  boiled & drained) Kale (cooked, or boiled & drained) Mustard greens (cooked, or boiled & drained) Parsley *serving size only =  cup Spinach (cooked, or boiled & drained) Swiss chard (cooked, or boiled & drained) Turnip greens (cooked, or boiled & drained)  Eat a consistent number of servings per week of foods MEDIUM-HIGH in Vitamin K (1 serving = 1 cup)  Asparagus (cooked, or boiled & drained) Broccoli (cooked, boiled & drained, or raw & chopped) Brussel sprouts (cooked, or boiled & drained) *serving size only =  cup Lettuce, raw (green leaf, endive, romaine) Spinach, raw Turnip greens, raw & chopped   These websites have more information on Coumadin (warfarin):  FailFactory.se; VeganReport.com.au;

## 2021-05-09 NOTE — Progress Notes (Addendum)
CARDIAC REHAB PHASE I   Called pt via room phone. Pt denies questions or concerns re d/c education. Encouraged mobility and nutrition. Pt grateful for the care he has received. D/c today. Can address CRP II as outpt as pt progresses.  [id number] Rufina Falco, RN BSN 05/09/2021 11:52 AM

## 2021-05-09 NOTE — Progress Notes (Addendum)
ANTICOAGULATION CONSULT NOTE   Pharmacy Consult for warfarin  Indication:  s/p VAD (HM3) , afib, hx DVT  No Active Allergies  Patient Measurements: Height: 6' (182.9 cm) Weight: 75.1 kg (165 lb 9.1 oz) IBW/kg (Calculated) : 77.6   Vital Signs: Temp: 97.6 F (36.4 C) (12/28 0500) Temp Source: Oral (12/28 0500) BP: 104/84 (12/28 0500) Pulse Rate: 86 (12/28 0500)  Labs: Recent Labs    05/07/21 0400 05/08/21 0345 05/09/21 0525  HGB 9.4* 9.1* 9.2*  HCT 30.5* 30.9* 30.1*  PLT 223 235 207  LABPROT 27.1* 25.2* 23.0*  INR 2.5* 2.3* 2.0*  CREATININE 0.72 0.62 0.56*     Estimated Creatinine Clearance: 95.2 mL/min (A) (by C-G formula based on SCr of 0.56 mg/dL (L)).   Medical History: Past Medical History:  Diagnosis Date   AICD (automatic cardioverter/defibrillator) present 08/31/2019   Ankylosing spondylitis ([place])    Arthritis    BENIGN PROSTATIC HYPERTROPHY 06/07/2008   CHF (congestive heart failure) ([place])    COLONIC POLYPS, HX OF 06/07/2008   Depression    H/O hiatal hernia    Heart failure ([place])    HYPERLIPIDEMIA 06/07/2008   HYPERTENSION 06/07/2008   Myocardial infarction [facility]) 2005   NSTEMI, s/p LAD stent   NEPHROLITHIASIS, HX OF 06/07/2008   STEMI (ST elevation myocardial infarction) ([place]) 04/27/2019    Assessment: 65 yo M s/p HM 3 and AVR on 04/06/21. Prior to surgery/admit patient was on apixiban for a DVT.  Pt s/p thora 12/8, 12/9, and 12/12. INR subtherapeutic after lower doses for that procedure. Started fixed heparin dose at 400 uts/hr started on 12/12 with heparin level daily. - stopped when INR >1.8.   INR therapeutic at 2 - continuing to trend down slightly but likely secondary to held dose on 12/24. Hgb 9.2, plt 207. LDH 146. No s/sx of bleeding.   Goal of Therapy:  Warfarin INR 2.0-3.0 Monitor platelets by anticoagulation protocol: Yes   Plan:  Warfarin 5 mg x1 today prior to discharge Will discharge on warfarin 2.5 mg daily except 5 mg on  Tuesday/Saturday Completed aspirin regimen  Antonietta Jewel, PharmD, [place] Pharmacist  Phone: [phone] 05/09/2021 8:48 AM  Please check AMION for all [place] phone numbers After 10:00 PM, call [place] [phone]

## 2021-05-09 NOTE — Progress Notes (Addendum)
Patient ID: Brandon [name], male   DOB: 1955-08-11, 65 y.o.   MRN: [id number]      Advanced Heart Failure Rounding Note  PCP-Cardiologist: [name], MD  [facility]: Dr. [name]   Subjective:    Admitted with sepsis. Started on cefepime + vanc, now off abx (ID following). Afebrile.  10/29 Co-ox 48%, started on milrinone 0.25.  10/30 IV Antibiotics stopped. Blood cultures no growth for 5 days.  10/31 started on Augmentin for PNA.  11/1 Milrinone off 11/2 Milrinone added back. Midodrine increased to 10 mg TID 11/10 Developed Afib w/ RVR, converted with IV amio 11/14 Milrinone increased to 0.25 mcg. Diuresed with IV lasix  11/25 Underwent HM-3 VAD placement and bioprosthetic AVR. Had persistent hypotension in OR requiring methylene blue and high dose pressors.  11/26 Extubated 11/28 Started on lasix gtt>>later stopped due to low CVP (1) and increasing pressor requirements. Restarted on scheduled BID IV Lasix once pressure stabilized (still volume up)  11/28 Surgical path with evidence of possible "acute AoV endocarditis". ID consulted. Bcx recollected. Tissue sent to [facility] in [place].   11/30 Started on IV ceftriaxone to cover the Streptococcus gordonae that was isolated before, per ID. 11/30 VAD speed increased to 5600 12/1 Diuresed with IV lasix + metolazone. Milrinone was stopped. Amio drip transitioned to amio 200 mg twice a day.  12/5 Ramp Echo speed increased to 5800. Epi stopped.  12/6 Transferred to [name]. Had BM. Started on spiro 12.5 mg daily 12/8 CXR with significant bilateral pleural effusions. S/p Left Thoracentesis w/ 1.7 L out but still with residual fluid 12/9 Rt Thoracentesis  12/11 Given dose of tolvaptan and diuresed with IV lasix. Negative 2.5 liters.  12/12 Repeat Lt Thoracentesis w/ 1L Fluid removal  12/18 + SARS-CoV-2  Hypotensive overnight w/ several PI events. SBP dropped to the 80s, doppler MAP 60s. Pt asymptomatic. Given 250 mL NS bolus.   BP improved today, MAPs  now 80s-90s. Hgb stable at 9.2. INR 2.0   Feels "much better" today. More energy. Legs edema improved. Denies dyspnea. No chest pain.    LVAD Interrogation HM 3: Speed: 5800 Flow: 4.2 PI: 7.2 Power: 4.5. multiple PI events  VAD interrogated personally. Parameters stable.   Objective:   Weight Range: 75.1 kg Body mass index is 22.45 kg/m.   Vital Signs:   Temp:  [97.6 F (36.4 C)-98.6 F (37 C)] 97.6 F (36.4 C) (12/28 0500) Pulse Rate:  [80-98] 86 (12/28 0500) Resp:  [14-20] 14 (12/28 0500) BP: (86-125)/(52-89) 104/84 (12/28 0500) SpO2:  [98 %-100 %] 98 % (12/28 0500) Weight:  [75.1 kg] 75.1 kg (12/28 0500) Last BM Date: 05/07/21  Weight change: Filed Weights   05/08/21 0049 05/08/21 0500 05/09/21 0500  Weight: 76.3 kg 76.3 kg 75.1 kg    Intake/Output:   Intake/Output Summary (Last 24 hours) at 05/09/2021 0738 Last data filed at 05/09/2021 0500 Gross per 24 hour  Intake 2530 ml  Output 3125 ml  Net -595 ml     Physical Exam   General:  well appearing. NAD  HEENT: normal  Neck: supple. JVD ~7 cm  Carotids 2+ bilat; no bruits. No lymphadenopathy or thryomegaly appreciated. Cor: LVAD hum.  Lungs: clear bilaterally  Abdomen: soft, nontender, non-distended. No hepatosplenomegaly. No bruits or masses. Good bowel sounds. Driveline site clean. Anchor in place.  Extremities: no cyanosis, clubbing, rash. Warm 1+ bilateral pedal edema Neuro: alert & oriented x 3. No focal deficits. Moves all 4 without problem  Telemetry   ST, low 100s  Personally reviewed  Labs    CBC Recent Labs    05/08/21 0345 05/09/21 0525  WBC 7.0 5.6  HGB 9.1* 9.2*  HCT 30.9* 30.1*  MCV 81.1 80.9  PLT 235 573   Basic Metabolic Panel Recent Labs    05/08/21 0345 05/09/21 0525  NA 133* 136  K 3.8 3.6  CL 100 100  CO2 28 29  GLUCOSE 96 90  BUN 12 12  CREATININE 0.62 0.56*  CALCIUM 8.5* 8.3*  MG  --  1.7   Liver Function Tests No results for input(s): AST, ALT,  ALKPHOS, BILITOT, PROT, ALBUMIN in the last 72 hours.    No results for input(s): LIPASE, AMYLASE in the last 72 hours. Cardiac Enzymes No results for input(s): CKTOTAL, CKMB, CKMBINDEX, TROPONINI in the last 72 hours.  BNP: BNP (last 3 results) Recent Labs    04/26/21 0420 05/04/21 0410 05/08/21 0345  BNP 273.9* 156.7* 349.4*    ProBNP (last 3 results) No results for input(s): PROBNP in the last 8760 hours.    D-Dimer No results for input(s): DDIMER in the last 72 hours. Hemoglobin A1C No results for input(s): HGBA1C in the last 72 hours.  Fasting Lipid Panel No results for input(s): CHOL, HDL, LDLCALC, TRIG, CHOLHDL, LDLDIRECT in the last 72 hours. Thyroid Function Tests No results for input(s): TSH, T4TOTAL, T3FREE, THYROIDAB in the last 72 hours.  Invalid input(s): FREET3  Other results:   Imaging    No results found.   Medications:     Scheduled Medications:  (feeding supplement) PROSource Plus  30 mL Oral TID PC & HS   sodium chloride   Intravenous Once   amiodarone  200 mg Oral Daily   vitamin C  500 mg Oral Daily   atorvastatin  80 mg Oral Daily   Chlorhexidine Gluconate Cloth  6 each Topical Daily   digoxin  0.125 mg Oral Daily   doxycycline  100 mg Oral Q12H   eplerenone  25 mg Oral Daily   ezetimibe  10 mg Oral Daily   feeding supplement  237 mL Oral TID BM   folic acid  1 mg Oral Daily   gabapentin  300 mg Oral TID   Gerhardt's butt cream   Topical Daily   insulin aspart  0-15 Units Subcutaneous TID WC   losartan  25 mg Oral Daily   mouth rinse  15 mL Mouth Rinse BID   melatonin  5 mg Oral QHS   multivitamin with minerals  1 tablet Oral Q lunch   pantoprazole  40 mg Oral Daily   sertraline  100 mg Oral Daily   sodium chloride flush  10-40 mL Intracatheter Q12H   tamsulosin  0.4 mg Oral Daily   thiamine  100 mg Oral Daily   vitamin B-12  100 mcg Oral Daily   Warfarin - Pharmacist Dosing Inpatient   Does not apply q1600     Infusions:  sodium chloride Stopped (04/14/21 1149)   cefTRIAXone (ROCEPHIN)  IV 2 g (05/08/21 1154)   lactated ringers Stopped (04/13/21 1053)     PRN Medications: sodium chloride, acetaminophen, bisacodyl **OR** bisacodyl, lidocaine, morphine injection, ondansetron (ZOFRAN) IV, oxyCODONE, polyvinyl alcohol, sodium chloride flush, sodium chloride flush, traMADol    Patient Profile   Mr Senters is a 65 year old with history of smoker, CAD s/p previous MI, HTN, HL, chronic HFrEF, sepsis 11/2020 bacteremia.  Admitted with sepsis. Bld Cx - NGTD  Assessment/Plan   1. Acute on chronic systolic HFr EF -> cardiogenic shock - Echo 05/19/20 EF 20-25% RV mildly HK. moderate AS  Mean gradient 13 AVA 1.2 cm2 DI 0.30 - Persistent NYHA IV. Admitted with shock - s/p HM-III VAD + bioprosthetic AVR on 11/25 - RV looked good on TEE in OR - Continue digoxin 0.125. Dig level ok (0.5)  - continue Losartan 25 mg daily - Continue eplerenone 25 daily - Volume was low. Got IVF bolus on 12/23 and 12/24.  - 12/26-27 IV Lasix given for volume overload. Volume/ leg edema improved. Switch to PO torsemide today    2. HM-3 LVAD implant - VAD interrogated personally. Parameters stable. - ASA stopped 12/26 - INR 2.0. Goal 2.0-3.0  PharmD following - LDH 146  - MAPs 70-80s  - Had ramp ECHO speed increased to 5800.   3. Acute Hypoxic Respiratory Failure  - Extubated 11/26, stable on 2L [place].    4. 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 CAD  s/p anterior STEMI (12/20). LHC showed chronically occluded RCA (with L>>R collaterals) and thrombotic occlusion of proximal LAD. Underwent PCI of LAD. - No s/s angina - Continue atorvastatin 80.  - now off ASA.   6. Iron-deficiency anemia & ABLA - Rec'd Feraheme 11/8 and 11/13  - Rec'd 1 unit PRBCs on 12/10 - Keep hgb > 8.0,  9.2 today  - 04/23/21 Iron sats  low. Given feraheme  7. Hypokalemia/ Hypomagnesemia  - Keep K > 4.0 Mg > 2.0  - K 3.6 - Supp w/ KCl   8. Severe protein-calorie malnutrition - prealbumin 11>15> 13 >6.6>15  - Continue aggressive nutritional support  9. Paroxysmal Atrial Fibrillation w/ RVR - new, developed 11/10. Maintaining SR/ST    - Off amio drip. Continue amio 200 mg once daily. Stop as outpatient - on warfarin. INR 2.0 today.   10. Hyponatremia - Na 133 -> 130->133->136 today - tolvaptan on 12/11.  - monitor   11. Sepsis - H/o strep bacteremia 11/2020.  ICD extracted 12/04/20. Barostim was not removed as it is extravascular.  - Received IV antibiotics until 01/15/21. Blood cultures negative 01/30/21..  - 10/26 -Blood CX x2  Negative  - Ct chest concerning for pneumonia. 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   PCT < 0.1, WBCs 7, afebrile.  Cultures NGTD,  vancomycin stopped.   - 12/18 + SARS-CoV-2. See below.   12. Hemorrhagic Pericarditis - Fibrin clot encasing heart at time of surgery.   - No effusion on f/u echo  13. 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  - Bcx recollected and pending  - Tissue sent to [facility] in [place] for broad 16 S ribosomal sequencing for bacterial pathogens as well as T wet Bhilai Bartonella Brucella  - ID following, recommending  IV ceftriaxone to cover the Streptococcus gordonae that was isolated before given possible acute AoV endocarditis on surgical path. - Ceftriaxone 2 g IV daily x 6 weeks. End Date: May 22, 2021 - Home IV abx management has been arranged with [facility] and [facility] -  PICC line placed  12/22  14. Constipation - Resolved.    15. Bilateral pleural effusions - S/p Left thoracentesis 12/8 but still with residual fluid. Will likely need repeat L tap in a few days - R thoracentesis 12/9.  - S/p repeat left thoracentesis 12/12 w/ 1L Fluid removal  - Repeat CXR 12/15 w/ small b/l effusions.  - Lungs okay on exam. O2 sats stable. - resolved  16. SARS-CoV-2 -Completed course of Remdesivir  Disposition: -Anticipate discharge today, pending MD clearance. His neighbor tested negative for covid and feeling better.   -Will need home antibiotics, already arranged with [facility] and [facility]  Length of Stay: [address] PA-C  05/09/2021, 7:38 AM  Advanced Heart Failure Team Pager [phone] (M-F; 7a - 5p)  Please contact [facility] for night-coverage after hours (5p -7a ) and weekends on [email or web address]  Patient seen with PA, agree with the above note.   No complaints this morning, wants to go home.   MAP fluctuates, had some low readings overnight.  Now stable. Weight down 3 lbs with IV diuresis yesterday.   General: Well appearing this am. NAD.  HEENT: Normal. Neck: Supple, JVP 8 cm. Carotids OK.  Cardiac:  Mechanical heart sounds with LVAD hum present.  Lungs:  CTAB, normal effort.  Abdomen:  NT, ND, no HSM. No bruits or masses. +BS  LVAD exit site: Well-healed and incorporated. Dressing dry and intact. No erythema or drainage. Stabilization device present and accurately applied. Driveline dressing changed daily per sterile technique. Extremities:  Warm and dry. No cyanosis, clubbing, rash, or edema.  Neuro:  Alert & oriented x 3. Cranial nerves grossly intact. Moves all 4 extremities w/o difficulty. Affect pleasant    MAP up and down, will decrease losartan to 12.5 mg daily for home.   I think that he will need some diuretic at home for now, will send on torsemide 20 mg daily.    He will need to continue ceftriaxone via PICC to 05/22/21,  arrange for PICC maintenance at home.   INR therapeutic on warfarin.   Discharge today, caregiver is ready.  Will need LVAD followup and home health for PICC/ceftriaxone.   Loralie Champagne 05/09/2021 8:50 AM

## 2021-05-09 NOTE — Consult Note (Signed)
° °  [facility] Inpatient Consult   05/09/2021  Brandon [name] Oct 25, 1955 [id number]  [place] Organization [ACO] Patient: Medicare  Extreme high risk: LLOS 63 days  Patient on Airborne Precautions:   Follow up:  Attempts to reach patient by phone to remind of post hospital follow up with PCP  Plan: Referral for Embedded CCM follow up  Natividad Brood, RN BSN [facility] Liaison  [phone] business mobile phone Toll free office [phone]  Fax number: [phone] [name]

## 2021-05-09 NOTE — Progress Notes (Signed)
Discharge equipment includes:  1. Two system controllers. 2. Mobile Power Unit (MPU) with 20 patient cable 3. One universal Charity fundraiser (UBC) 4. Eight fully charged batteries  5. Four battery clips 6. One travel case 7. One holster vest 8. 1 Consolidated bag  8. Wearable accessory package 9. Daily dressing kits, anchors, Aquacel (silver dressing)   VAD Education:   1. Reviewed importance of having a 24 hour caregiver 2. Reviewed dressing change frequency 3. Reviewed when to call the VAD pager and made sure they have phone number in their phone.  4. Reviewed importance of changing one power source at a time 5. Reviewed importance of carrying black emergency bag containing backup controller, 2 batteries, and 2 battery clips, everywhere 6. Reviewed importance of placing mobile power unit (MPU) and batteries on bedside table with a flashlight. Talked about what to do in case of power failure. Reminded to make sure the outlets that equipment is plugged into are not controlled by a light switch.  7. Reviewed importance of using anchors to hold drive line in place, to prevent accidental pulling, or dislodgement of drive line.  8. Patient and family agreed to pick up prescriptions. Stressed importance of taking Warfarin daily in the evening. Stressed importance of taking all prescribed medications as written 9. First clinic visit bring all medications and VAD log.    Pt discharging to his house as per original plan. His caregivers plan to alternate spending the night with him for the first 2 weeks, and as needed after initial 2- week period. [name] will plan to change drive line dressing on Mondays/Thursdays, or as needed. Pt verbalized agreement with this plan.   Emerson Monte RN [place] Coordinator  Office: [phone]  24/7 Pager: [phone]

## 2021-05-10 ENCOUNTER — Encounter (HOSPITAL_COMMUNITY): Payer: Self-pay | Admitting: *Deleted

## 2021-05-10 ENCOUNTER — Encounter: Payer: Self-pay | Admitting: *Deleted

## 2021-05-10 DIAGNOSIS — Z48812 Encounter for surgical aftercare following surgery on the circulatory system: Secondary | ICD-10-CM | POA: Diagnosis not present

## 2021-05-15 ENCOUNTER — Ambulatory Visit (HOSPITAL_COMMUNITY): Payer: Self-pay | Admitting: Pharmacist

## 2021-05-15 ENCOUNTER — Other Ambulatory Visit (HOSPITAL_COMMUNITY): Payer: Self-pay

## 2021-05-15 DIAGNOSIS — Z95811 Presence of heart assist device: Secondary | ICD-10-CM

## 2021-05-15 DIAGNOSIS — Z7901 Long term (current) use of anticoagulants: Secondary | ICD-10-CM | POA: Insufficient documentation

## 2021-05-15 LAB — POCT INR: INR: 1.4 — AB (ref 2.0–3.0)

## 2021-05-16 ENCOUNTER — Other Ambulatory Visit (HOSPITAL_COMMUNITY): Payer: Self-pay | Admitting: *Deleted

## 2021-05-16 DIAGNOSIS — Z5181 Encounter for therapeutic drug level monitoring: Secondary | ICD-10-CM

## 2021-05-16 DIAGNOSIS — Z7901 Long term (current) use of anticoagulants: Secondary | ICD-10-CM

## 2021-05-16 DIAGNOSIS — Z79899 Other long term (current) drug therapy: Secondary | ICD-10-CM

## 2021-05-16 DIAGNOSIS — Z95811 Presence of heart assist device: Secondary | ICD-10-CM

## 2021-05-17 ENCOUNTER — Telehealth: Payer: Self-pay | Admitting: *Deleted

## 2021-05-17 NOTE — Chronic Care Management (AMB) (Signed)
Chronic Care Management   Note  05/17/2021 Name: JEMERY Baelyn Doring MRN: [id number] DOB: 1955-06-22  RILEN SHUKLA is a 66 y.o. year old male who is a primary care patient of [name], MD. I reached out to Jennefer Bravo by phone today in response to a referral sent by Mr. Malena Edman Mcgahan's PCP.  Mr. Maldonado was given information about Chronic Care Management services today including:  CCM service includes personalized support from designated clinical staff supervised by his physician, including individualized plan of care and coordination with other care providers 24/7 contact phone numbers for assistance for urgent and routine care needs. Service will only be billed when office clinical staff spend 20 minutes or more in a month to coordinate care. Only one practitioner may furnish and bill the service in a calendar month. The patient may stop CCM services at any time (effective at the end of the month) by phone call to the office staff. The patient is responsible for co-pay (up to 20% after annual deductible is met) if co-pay is required by the individual health plan.   Patient agreed to services and verbal consent obtained.   Follow up plan: Telephone appointment with care management team member scheduled for:05/29/21  [place] [place] [facility]  Direct Dial: [phone]

## 2021-05-18 ENCOUNTER — Ambulatory Visit (HOSPITAL_COMMUNITY): Payer: Self-pay | Admitting: Pharmacist

## 2021-05-18 ENCOUNTER — Encounter (HOSPITAL_COMMUNITY): Payer: Self-pay

## 2021-05-18 ENCOUNTER — Other Ambulatory Visit: Payer: Self-pay

## 2021-05-18 ENCOUNTER — Ambulatory Visit (HOSPITAL_COMMUNITY)
Admit: 2021-05-18 | Discharge: 2021-05-18 | Disposition: A | Discharge status: Home / Self Care | Payer: Medicare Other | Attending: Family Medicine | Admitting: Family Medicine

## 2021-05-18 VITALS — BP 94/53 | HR 99 | Temp 97.6°F | Wt 166.8 lb

## 2021-05-18 DIAGNOSIS — I330 Acute and subacute infective endocarditis: Secondary | ICD-10-CM

## 2021-05-18 DIAGNOSIS — Z95810 Presence of automatic (implantable) cardiac defibrillator: Secondary | ICD-10-CM | POA: Diagnosis not present

## 2021-05-18 DIAGNOSIS — Z955 Presence of coronary angioplasty implant and graft: Secondary | ICD-10-CM | POA: Diagnosis not present

## 2021-05-18 DIAGNOSIS — I350 Nonrheumatic aortic (valve) stenosis: Secondary | ICD-10-CM | POA: Insufficient documentation

## 2021-05-18 DIAGNOSIS — I255 Ischemic cardiomyopathy: Secondary | ICD-10-CM | POA: Diagnosis not present

## 2021-05-18 DIAGNOSIS — E785 Hyperlipidemia, unspecified: Secondary | ICD-10-CM | POA: Insufficient documentation

## 2021-05-18 DIAGNOSIS — I480 Paroxysmal atrial fibrillation: Secondary | ICD-10-CM | POA: Insufficient documentation

## 2021-05-18 DIAGNOSIS — J90 Pleural effusion, not elsewhere classified: Secondary | ICD-10-CM | POA: Diagnosis not present

## 2021-05-18 DIAGNOSIS — F419 Anxiety disorder, unspecified: Secondary | ICD-10-CM | POA: Diagnosis not present

## 2021-05-18 DIAGNOSIS — I5022 Chronic systolic (congestive) heart failure: Secondary | ICD-10-CM | POA: Insufficient documentation

## 2021-05-18 DIAGNOSIS — I252 Old myocardial infarction: Secondary | ICD-10-CM | POA: Diagnosis not present

## 2021-05-18 DIAGNOSIS — E871 Hypo-osmolality and hyponatremia: Secondary | ICD-10-CM | POA: Insufficient documentation

## 2021-05-18 DIAGNOSIS — R42 Dizziness and giddiness: Secondary | ICD-10-CM | POA: Insufficient documentation

## 2021-05-18 DIAGNOSIS — I110 Hypertensive heart disease with heart failure: Secondary | ICD-10-CM | POA: Insufficient documentation

## 2021-05-18 DIAGNOSIS — R21 Rash and other nonspecific skin eruption: Secondary | ICD-10-CM

## 2021-05-18 DIAGNOSIS — Z5181 Encounter for therapeutic drug level monitoring: Secondary | ICD-10-CM | POA: Diagnosis not present

## 2021-05-18 DIAGNOSIS — Z7901 Long term (current) use of anticoagulants: Secondary | ICD-10-CM | POA: Diagnosis not present

## 2021-05-18 DIAGNOSIS — R110 Nausea: Secondary | ICD-10-CM

## 2021-05-18 DIAGNOSIS — F32A Depression, unspecified: Secondary | ICD-10-CM | POA: Diagnosis not present

## 2021-05-18 DIAGNOSIS — Z79899 Other long term (current) drug therapy: Secondary | ICD-10-CM | POA: Diagnosis not present

## 2021-05-18 DIAGNOSIS — Z95811 Presence of heart assist device: Secondary | ICD-10-CM | POA: Diagnosis not present

## 2021-05-18 DIAGNOSIS — Z953 Presence of xenogenic heart valve: Secondary | ICD-10-CM | POA: Diagnosis not present

## 2021-05-18 DIAGNOSIS — I2510 Atherosclerotic heart disease of native coronary artery without angina pectoris: Secondary | ICD-10-CM | POA: Insufficient documentation

## 2021-05-18 LAB — LACTATE DEHYDROGENASE: LDH: 172 U/L (ref 98–192)

## 2021-05-18 LAB — CBC
HCT: 38.2 % — ABNORMAL LOW (ref 39.0–52.0)
Hemoglobin: 11.6 g/dL — ABNORMAL LOW (ref 13.0–17.0)
MCH: 24.4 pg — ABNORMAL LOW (ref 26.0–34.0)
MCHC: 30.4 g/dL (ref 30.0–36.0)
MCV: 80.3 fL (ref 80.0–100.0)
Platelets: 260 10*3/uL (ref 150–400)
RBC: 4.76 MIL/uL (ref 4.22–5.81)
RDW: 21.2 % — ABNORMAL HIGH (ref 11.5–15.5)
WBC: 8.8 10*3/uL (ref 4.0–10.5)
nRBC: 0 % (ref 0.0–0.2)

## 2021-05-18 LAB — BASIC METABOLIC PANEL
Anion gap: 13 (ref 5–15)
BUN: 12 mg/dL (ref 8–23)
CO2: 30 mmol/L (ref 22–32)
Calcium: 9.5 mg/dL (ref 8.9–10.3)
Chloride: 92 mmol/L — ABNORMAL LOW (ref 98–111)
Creatinine, Ser: 0.76 mg/dL (ref 0.61–1.24)
GFR, Estimated: >60 mL/min (ref >60)
Glucose, Bld: 126 mg/dL — ABNORMAL HIGH (ref 70–99)
Potassium: 3.4 mmol/L — ABNORMAL LOW (ref 3.5–5.1)
Sodium: 135 mmol/L (ref 135–145)

## 2021-05-18 LAB — PROTIME-INR
INR: 1.7 — ABNORMAL HIGH (ref 0.8–1.2)
Prothrombin Time: 20.4 seconds — ABNORMAL HIGH (ref 11.4–15.2)

## 2021-05-18 LAB — MAGNESIUM: Magnesium: 1.7 mg/dL (ref 1.7–2.4)

## 2021-05-18 LAB — DIGOXIN LEVEL: Digoxin Level: 0.7 ng/mL — ABNORMAL LOW (ref 0.8–2.0)

## 2021-05-18 MED ORDER — NYSTATIN 100000 UNIT/GM EX OINT: 1.0000 "application " | TOPICAL_OINTMENT | Freq: Two times a day (BID) | CUTANEOUS | 3 refills | Status: DC

## 2021-05-18 MED ORDER — ONDANSETRON HCL 4 MG PO TABS: 4.0000 mg | ORAL_TABLET | Freq: Three times a day (TID) | ORAL | 3 refills | Status: DC | PRN

## 2021-05-18 MED ORDER — TORSEMIDE 20 MG PO TABS: 20.0000 mg | ORAL_TABLET | ORAL | 5 refills | Status: DC | PRN

## 2021-05-18 NOTE — Progress Notes (Signed)
Patient presents for hospital discharge f/u in [facility] today with his friend Brandon [name]. Reports no problems with VAD equipment or concerns with drive line.  Reports he has been doing ok since hospital discharge. Denies falls, heart failure symptoms, and signs of bleeding. Reports intermittent nausea at times when taking medications/PRN Tramadol. Instructed to make sure he is eating prior to medication administration. PRN Zofran sent to pt's pharmacy per Dr [name].   Reports intermittent dizziness and lightheadedness when standing/walking. 100+ PI events daily noted on VAD interrogation. Pt reports he is not drinking 2 L per day. He is currently taking Torsemide 20 mg daily with K 20 meq. Per Dr [name] will stop Torsemide today; may take PRN. Will continue K 20 meq daily as K 3.4 on labs today, but suspect we will be able to stop next week. Instructed to increase PO intake to at least 2 liters, and may liberalize diet/salt intake. He verbalized understanding of all the above instructions.    Pt forgot back up bag today. Discussed he must always have back-up system controller as well as extra batteries with him at all times. This is considered an absolute requirement necessary for his safety and well-being. He verbalized understanding.   Drive line dressing change performed today. See documentation below. Red, yeast-like rash noted under previous anchor sites. Pt had EKG sticker stuck to skin near previous anchor site- same rash noted beneath sticker. Discussed with Dr [name]. Order received for Nystatin cream. Prescription sent to pt's pharmacy. Discussed with patient and Brandon [name] that he may use cream on rash, but do not place at exit site. Both verbalized understanding.    Left arm PICC in place. Taking IV Rocephin daily with end date of 05/22/21. He has follow up with ID 05/24/21. Plan for PICC to be d/c at that time per ID recommendations. Pt has [facility] thru [facility].   Reports he has not  heard from [facility] yet, but his HHRN told him to expect a call today or Monday to initiate HHPT. Instructed to notify VAD coordinators if he does not hear from them. He verbalized understanding.   Vital Signs:  Temp: 97.6 Doppler Pressure: 96 Automatc BP: 94/53 (76) HR:  99 w/ occasional PVCs SPO2: UTO %   Weight: 166.8 lb w/ eqt Last weight: 165.5 lb Home weights: 167.5 - 173 lbs BMI today 22.62 today     VAD Interrogation: Speed: 5800 Flow: 4.8 Power: 4.9 w    PI: 3.3 (as high as 9.4) Alarms: no clinical alarms  Events: 100 + today  Fixed speed: 5800 Low speed limit: 5500  Primary controller: back up battery due for replacement in 31 months Secondary controller:  back up battery due for replacement in   months -- forgot to bring today   I reviewed the LVAD parameters from today and compared the results to the patient's prior recorded data. LVAD interrogation was NEGATIVE for significant power changes, NEGATIVE for clinical alarms and STABLE for PI events/speed drops. No programming changes were made and pump is functioning within specified parameters. Pt is performing daily controller and system monitor self tests along with completing weekly and monthly maintenance for LVAD equipment.   LVAD equipment check completed and is in good working order. Back-up equipment not present. Charged back up battery and performed self-test on equipment.    Annual Equipment Maintenance on UBC/PM was performed on 12/22.    Exit Site Care: Dressing maintained by patient's friend [name] twice weekly. Existing VAD dressing  and anchor removed and site care performed using sterile technique. Drive line exit site cleaned with Chlora prep applicators x 2, allowed to dry Sorbaview dressing with Silverlon re-applied. Exit site healing and partially incorporated.The velour is fully implanted at exit site. Scant dried yellow drainage on silver strip. No redness, tenderness, or foul odor noted. Redened rash  noted under previous anchor sites as noted above. Anchors repositioned and re-applied. Pt denies fever or chills. Will advance to weekly dressing changes using weekly kit. Robin video taped portions of dressing change to show [name] the differences between a daily dressing kit and a weekly dressing kit.  Dicussed Silverlon patch with pt, Brandon [name], and in video for [employer]. Pt provided with 4 weekly kits for home use.      BP & Labs:  MAP 96 - Doppler is reflecting modified systolic   Hgb 59.9 - No S/S of bleeding. Specifically denies melena/BRBPR or nosebleeds.   LDH stable at 172 with established baseline of 135 - 309. Denies tea-colored urine. No power elevations noted on interrogation.    Plan:  Stop Torsemide. May take as needed Start Nystatin cream on rash on your side. DO NOT put at exit site.  I sent in prescription for as needed Zofran (nausea medication) Coumadin dosing per Ander Purpura PharmD. Take 7.5 mg (3 tabs) tonight then continue 2.5 (1 tab) every Monday, Wednesday, Friday and 5 mg (2 tablets) all other days. Repeat INR Monday with home health Return to [facility] in 1 week for follow up with Dr [name] Advance to weekly drive line dressing changes using weekly kit  Emerson Monte RN [place] Coordinator  Office: [phone]  24/7 Pager: [phone]

## 2021-05-18 NOTE — Patient Instructions (Addendum)
Stop Torsemide. May take as needed Start Nystatin cream on rash on your side. DO NOT put at exit site.  I sent in prescription for as needed Zofran (nausea medication) Coumadin dosing per Ander Purpura PharmD. Take 7.5 mg (3 tabs) tonight then continue 2.5 (1 tab) every Monday, Wednesday, Friday and 5 mg (2 tablets) all other days. Repeat INR Monday with HH. Return to [facility] in 1 week for follow up with Dr [name] Advance to weekly drive line dressing changes using weekly kit

## 2021-05-18 NOTE — Progress Notes (Signed)
LVAD INR 

## 2021-05-20 NOTE — Progress Notes (Addendum)
VAD CLINIC NOTE   PCP: [name], MD HF MD: DB   HPI:  Brandon [name] is a 66 y/o male with CAD s/p anterior MI 12/20 with stenting of LAD, HTN, HL, chronic HFeEF, severe low-flow AS, strep bacteremia.    Admitted 12/20 with anterior STEMI. Cath with chronically occluded RCA (L>>R collaterals) and thrombotic occlusion of pLAD treated with PCI. Echo EF 30-35% w/ apical aneurysm. RV ok. Post cath, he required milrinone for cardiogenic shock. Eventually underwent Barostim placement   In 7/22 developed worsening HF. Found to have severe low flow AS EF 20-25%. 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 Underwent outpatient [facility] 11/29/20 as part of VAD w/u. He had low filling pressures and CI 2.3. Post procedure, developed severe rigors, n/v and fever. Admitted for suspected sepsis where he then developed acute hypoxic respiratory failure, requiring intubation. BCX + strep  Underwent ICD extraction. Completed IV abx on 01/16/21.    Admitted 03/07/21 with sepsis and initiated on IV abx. BCx remained negative. Developed refractory cardiogenic shock with severe protein-calorie malnutrition. Eventually underwent placement of HM3 VAD and aortic valve replacement with 25 mm Edwards Inspiris Resilia valve on 04/06/21.  Surgical path with possible "acute Aov endocarditis". Started on IV ceftriaxone to cover Streptococcus gordonae that was isolated previously.  Post VAD course c/b PAF, hyponatremia, bilateral pleural effusion s/p tap and COVID. D/c'd home 05/03/21 with IV abx.   Follow up for Heart Failure/LVAD: Here for post-hospital f/u. Doing well. Feels much better. Able to do all ADLs without problem. Only complaint is dizziness whenever he stands. Denies orthopnea or PND. No fevers, chills or problems with driveline. No bleeding, melena or neuro symptoms. No VAD alarms. Taking all meds as prescribed.   VAD Interrogation: Speed: 5800 Flow: 4.8 Power: 4.9 w    PI: 3.3 (as high as  9.4) Alarms: no clinical alarms  Events: 100 + today  Fixed speed: 5800 Low speed limit: 5500   Primary controller: back up battery due for replacement in 31 months Secondary controller:  back up battery due for replacement in   months -- forgot to bring today Denies LVAD alarms.  Denies driveline trauma, erythema or drainage.  Denies ICD shocks.   Reports taking Coumadin as prescribed and adherence to anticoagulation based dietary restrictions.  Denies bright red blood per rectum or melena, no dark urine or hematuria.     Past Medical History:  Diagnosis Date   AICD (automatic cardioverter/defibrillator) present 08/31/2019   Ankylosing spondylitis ([place])    Arthritis    BENIGN PROSTATIC HYPERTROPHY 06/07/2008   CHF (congestive heart failure) ([place])    COLONIC POLYPS, HX OF 06/07/2008   Depression    H/O hiatal hernia    Heart failure ([place])    HYPERLIPIDEMIA 06/07/2008   HYPERTENSION 06/07/2008   Myocardial infarction [facility]) 2005   NSTEMI, s/p LAD stent   NEPHROLITHIASIS, HX OF 06/07/2008   STEMI (ST elevation myocardial infarction) ([place]) 04/27/2019    Current Outpatient Medications  Medication Sig Dispense Refill   acetaminophen (TYLENOL) 500 MG tablet Take 1,000 mg by mouth every 6 (six) hours as needed (pain.).     atorvastatin (LIPITOR) 80 MG tablet TAKE 1 TABLET BY MOUTH ONCE DAILY AT 6PM. (Patient taking differently: Take 80 mg by mouth every evening.) 90 tablet 3   Carboxymethylcellulose Sodium (ARTIFICIAL TEARS OP) Place 1 drop into both eyes 3 (three) times daily as needed (for dryness or irritation).     cefTRIAXone (ROCEPHIN) IVPB  Inject 2 g into the vein daily. Indication:  Possible endocarditis First Dose: Yes Last Day of Therapy:  05/22/21 Labs - Once weekly:  CBC/D and BMP, Labs - Every other week:  ESR and CRP Method of administration: IV Push Method of administration may be changed at the discretion of home infusion pharmacist based upon assessment of the patient  and/or caregiver's ability to self-administer the medication ordered. 41 Units 0   digoxin (LANOXIN) 0.125 MG tablet Take 1 tablet (0.125 mg total) by mouth daily. 30 tablet 6   doxycycline (VIBRA-TABS) 100 MG tablet Take 1 tablet (100 mg total) by mouth 2 (two) times daily. 82 tablet 0   eplerenone (INSPRA) 50 MG tablet Take 0.5 tablets (25 mg total) by mouth in the morning. 30 tablet 6   ezetimibe (ZETIA) 10 MG tablet TAKE ONE TABLET BY MOUTH DAILY (Patient taking differently: Take 10 mg by mouth daily.) 90 tablet 3   gabapentin (NEURONTIN) 300 MG capsule Take 1 capsule (300 mg total) by mouth 3 (three) times daily. 90 capsule 5   losartan (COZAAR) 25 MG tablet Take 1/2 tablet (12.5 mg total) by mouth daily. 30 tablet 5   melatonin 5 MG TABS Take 1 tablet (5 mg total) by mouth at bedtime. 30 tablet 6   nystatin ointment (MYCOSTATIN) Apply 1 application topically 2 (two) times daily. 30 g 3   ondansetron (ZOFRAN) 4 MG tablet Take 1 tablet (4 mg total) by mouth every 8 (eight) hours as needed for nausea or vomiting. 20 tablet 3   pantoprazole (PROTONIX) 40 MG tablet Take 1 tablet (40 mg total) by mouth daily. 30 tablet 6   potassium chloride SA (KLOR-CON) 20 MEQ tablet Take 1 tablet (20 mEq total) by mouth daily. 30 tablet 6   sertraline (ZOLOFT) 100 MG tablet Take 1 tablet (100 mg total) by mouth daily. 30 tablet 2   tamsulosin (FLOMAX) 0.4 MG CAPS capsule TAKE 1 CAPSULE(0.4 MG) BY MOUTH DAILY (Patient taking differently: Take 0.4 mg by mouth daily.) 90 capsule 1   traMADol (ULTRAM) 50 MG tablet Take 1 tablet (50 mg total) by mouth every 6 (six) hours as needed for moderate pain. 30 tablet 0   warfarin (COUMADIN) 2.5 MG tablet Take 1 tablet (2.5 mg) daily in the evening except 2 tablets (5 mg) on Tuesday/Saturday 45 tablet 11   diphenhydrAMINE (BENADRYL) 25 MG tablet Take 50 mg by mouth at bedtime. (Patient not taking: Reported on 05/18/2021)     torsemide (DEMADEX) 20 MG tablet Take 1 tablet (20 mg  total) by mouth as needed. 30 tablet 5   No current facility-administered medications for this encounter.    Patient has no active allergies.   I reviewed the LVAD parameters from today, and compared the results to the patient's prior recorded data.  No programming changes were made.  The LVAD is functioning within specified parameters.  The patient performs LVAD self-test daily.  LVAD interrogation was negative for any significant power changes, alarms or PI events/speed drops.  LVAD equipment check completed and is in good working order.  Back-up equipment present.   LVAD education done on emergency procedures and precautions and reviewed exit site care.    Vitals:   05/18/21 0935 05/18/21 0936  BP: (!) 96/0 (!) 94/53  Pulse: 99   Temp: 97.6 F (36.4 C)   Weight: 75.7 kg (166 lb 12.8 oz)    Vital Signs:  Temp: 97.6 Doppler Pressure: 96 Automatc BP: 94/53 (76) HR:  99 w/ occasional PVCs SPO2: UTO %   Weight: 166.8 lb w/ eqt Last weight: 165.5 lb Home weights: 167.5 - 173 lbs BMI today 22.62 today     Physical Exam: General:  NAD.  HEENT: normal  Neck: supple. JVP not elevated.  Carotids 2+ bilat; no bruits. No lymphadenopathy or thryomegaly appreciated. Cor: LVAD hum.  Lungs: Clear. Abdomen:soft, nontender, non-distended. No hepatosplenomegaly. No bruits or masses. Good bowel sounds. Driveline site with surrounding fungal rash. Anchor in place.  Extremities: no cyanosis, clubbing, rash. Warm no edema  Neuro: alert & oriented x 3. No focal deficits. Moves all 4 without problem    ASSESSMENT AND PLAN:   1. Chronic systolic HFr EF  - due to iCM. s/p anterior STEMI (12/20). LHC showed chronically occluded RCA (with L>>R collaterals) and thrombotic occlusion of proximal LAD. Underwent PCI of LAD - Echo 05/19/20 EF 20-25% RV mildly HK. moderate AS  Mean gradient 13 AVA 1.2 cm2 DI 0.30 - s/p HM-III VAD + bioprosthetic AVR on 11/25 - Continues to improve NYHA II - Volume  status low - Stop torsemide. Encourage po intake - Continue eplerenone 12.5 daily for now - Continue losartan 12.5 daily - Continue digoxin 0.125     2. HM-3 LVAD implant on 04/06/21 - VAD interrogated personally. Parameters stable. - ASA stopped 12/26 - INR 1.7. Goal 2.0-3.0  Discussed dosing with PharmD personally. - LDH 172 - MAPs ok - DL site with surrounding fungal rash -> nystatin. Keep dry.   3. Severe low-flow aortic stenosis s/p AVR - s/p VAD/bioprosthetic AVR on 11/25   4. CAD  s/p anterior STEMI (12/20). LHC showed chronically occluded RCA (with L>>R collaterals) and thrombotic occlusion of proximal LAD. Underwent PCI of LAD. - No s/s angina - Continue atorvastatin 80/zetia 10 - now off ASA.   5. Paroxysmal Atrial Fibrillation w/ RVR (post-op) - In NSR today - Continue amio 200 mg once daily. Stop at next visit - INR management as above   6.. Hyponatremia - Resolved   7. Bacteremia and AoV endocarditis - H/o strep bacteremia 11/2020.  ICD extracted 12/04/20. Barostim was not removed as it is extravascular.  - Native AoV path with questionable acute endocarditis (multiple PMNs)  - IV ceftriaxone restarted 11/30 to cover the Streptococcus gordonae that was isolated before given possible acute AoV endocarditis on surgical path (see below) - Continue 6 weeks IV abx - Ceftriaxone 2 g IV daily x 6 weeks. End Date: May 22, 2021 - Home IV abx management has been arranged with [facility] and [place]  8. Anxiety/depression - continue sertraline  9. [employer] fan - Hopefully judgement will improve with VAD support  Glori Bickers, MD  9:26 PM

## 2021-05-21 ENCOUNTER — Ambulatory Visit (HOSPITAL_COMMUNITY): Payer: Self-pay | Admitting: Pharmacist

## 2021-05-21 ENCOUNTER — Telehealth (HOSPITAL_COMMUNITY): Payer: Self-pay | Admitting: *Deleted

## 2021-05-21 LAB — POCT INR: INR: 2.2 (ref 2.0–3.0)

## 2021-05-21 NOTE — Telephone Encounter (Signed)
Michelle with [facility] called to report pts INR 2.2  Call back # [phone]  Routed to [facility]

## 2021-05-21 NOTE — Progress Notes (Signed)
LVAD INR 

## 2021-05-23 ENCOUNTER — Other Ambulatory Visit (HOSPITAL_COMMUNITY): Payer: Self-pay | Admitting: *Deleted

## 2021-05-23 DIAGNOSIS — Z5181 Encounter for therapeutic drug level monitoring: Secondary | ICD-10-CM

## 2021-05-23 DIAGNOSIS — Z7901 Long term (current) use of anticoagulants: Secondary | ICD-10-CM

## 2021-05-23 DIAGNOSIS — Z95811 Presence of heart assist device: Secondary | ICD-10-CM

## 2021-05-24 ENCOUNTER — Encounter: Payer: Self-pay | Admitting: Infectious Diseases

## 2021-05-24 ENCOUNTER — Telehealth: Payer: Self-pay

## 2021-05-24 ENCOUNTER — Other Ambulatory Visit: Payer: Self-pay

## 2021-05-24 ENCOUNTER — Ambulatory Visit (HOSPITAL_COMMUNITY): Payer: Self-pay | Admitting: Pharmacist

## 2021-05-24 ENCOUNTER — Ambulatory Visit (HOSPITAL_COMMUNITY)
Admission: RE | Admit: 2021-05-24 | Discharge: 2021-05-24 | Disposition: A | Discharge status: Home / Self Care | Payer: Medicare Other | Source: Ambulatory Visit | Attending: Cardiology | Admitting: Cardiology

## 2021-05-24 ENCOUNTER — Ambulatory Visit (INDEPENDENT_AMBULATORY_CARE_PROVIDER_SITE_OTHER): Payer: Medicare Other | Admitting: Infectious Diseases

## 2021-05-24 VITALS — BP 100/0 | HR 70 | Ht 72.0 in | Wt 173.8 lb

## 2021-05-24 DIAGNOSIS — Z95811 Presence of heart assist device: Secondary | ICD-10-CM | POA: Insufficient documentation

## 2021-05-24 DIAGNOSIS — I2510 Atherosclerotic heart disease of native coronary artery without angina pectoris: Secondary | ICD-10-CM

## 2021-05-24 DIAGNOSIS — Z79899 Other long term (current) drug therapy: Secondary | ICD-10-CM | POA: Diagnosis not present

## 2021-05-24 DIAGNOSIS — Z5181 Encounter for therapeutic drug level monitoring: Secondary | ICD-10-CM

## 2021-05-24 DIAGNOSIS — Z7901 Long term (current) use of anticoagulants: Secondary | ICD-10-CM | POA: Insufficient documentation

## 2021-05-24 DIAGNOSIS — I330 Acute and subacute infective endocarditis: Secondary | ICD-10-CM

## 2021-05-24 DIAGNOSIS — I358 Other nonrheumatic aortic valve disorders: Secondary | ICD-10-CM

## 2021-05-24 DIAGNOSIS — Z4502 Encounter for adjustment and management of automatic implantable cardiac defibrillator: Secondary | ICD-10-CM | POA: Insufficient documentation

## 2021-05-24 DIAGNOSIS — I480 Paroxysmal atrial fibrillation: Secondary | ICD-10-CM

## 2021-05-24 LAB — BASIC METABOLIC PANEL
Anion gap: 10 (ref 5–15)
BUN: 11 mg/dL (ref 8–23)
CO2: 22 mmol/L (ref 22–32)
Calcium: 9 mg/dL (ref 8.9–10.3)
Chloride: 104 mmol/L (ref 98–111)
Creatinine, Ser: 0.59 mg/dL — ABNORMAL LOW (ref 0.61–1.24)
GFR, Estimated: >60 mL/min (ref >60)
Glucose, Bld: 123 mg/dL — ABNORMAL HIGH (ref 70–99)
Potassium: 3.7 mmol/L (ref 3.5–5.1)
Sodium: 136 mmol/L (ref 135–145)

## 2021-05-24 LAB — CBC
HCT: 34.9 % — ABNORMAL LOW (ref 39.0–52.0)
Hemoglobin: 10.8 g/dL — ABNORMAL LOW (ref 13.0–17.0)
MCH: 24.7 pg — ABNORMAL LOW (ref 26.0–34.0)
MCHC: 30.9 g/dL (ref 30.0–36.0)
MCV: 79.7 fL — ABNORMAL LOW (ref 80.0–100.0)
Platelets: 233 10*3/uL (ref 150–400)
RBC: 4.38 MIL/uL (ref 4.22–5.81)
RDW: 21.2 % — ABNORMAL HIGH (ref 11.5–15.5)
WBC: 8.8 10*3/uL (ref 4.0–10.5)
nRBC: 0 % (ref 0.0–0.2)

## 2021-05-24 LAB — LACTATE DEHYDROGENASE: LDH: 157 U/L (ref 98–192)

## 2021-05-24 LAB — PROTIME-INR
INR: 1.8 — ABNORMAL HIGH (ref 0.8–1.2)
Prothrombin Time: 20.6 seconds — ABNORMAL HIGH (ref 11.4–15.2)

## 2021-05-24 MED ORDER — WARFARIN SODIUM 2.5 MG PO TABS: ORAL_TABLET | ORAL | 11 refills | Status: DC

## 2021-05-24 NOTE — Progress Notes (Addendum)
Patient: Brandon [name]  DOB: 28-Jan-1956 MRN: [id number] PCP: [name], MD  Referring Provider:   Chief Complaint  Patient presents with   Follow-up    lvad     Subjective:  Brandon [name] is a 66 y.o. male here for hospital follow up.  Brandon [name] has a history of streptococcal gordonii bacteremia in July 2022 - PPM was removed due to concern for possible infection/colonization of bacteria in light of upcoming planned destination LVAD surgery. TEE at that time did not reveal any obvious vegetations on lead or native valves. He was treated with 6 week course of IV ceftriaxone following extraction at that time. Surveillance cultures were checked following surgery at follow up with Dr. [name] 9/20 and were negative.   Since that time Brandon [name] has undergone destination LVAD placement 04/06/2021 after a few hospitalizations for CHF exacerbations. Histopathology from the aortic valve pathology revealed possible acute endocarditis at that time. Blood and tissue cultures following surgery were sterile. Given risk for recent cardiac device it was decided to re-treat for possibility of acute endocarditis.  Fortunately the valve tissue also sent to [facility] for 16S ribosomal sequencing also which returned negative, final.    He has done well with IV ceftriaxone and no side effects that he was aware of. PICC line is without pain, drainage or erythema and is well maintained by [facility] Team. No swelling or altered sensation in affected distal extremity.    Review of Systems  Constitutional:  Negative for chills, fever, malaise/fatigue and weight loss.  HENT:  Negative for sore throat.        No dental problems  Respiratory:  Negative for cough and sputum production.   Cardiovascular:  Negative for chest pain and leg swelling.  Gastrointestinal:  Negative for abdominal pain, diarrhea and vomiting.  Genitourinary:  Negative for dysuria and flank pain.  Musculoskeletal:  Negative for  joint pain, myalgias and neck pain.  Skin:  Negative for rash.  Neurological:  Negative for dizziness, tingling and headaches.  Psychiatric/Behavioral:  Negative for depression and substance abuse. The patient is not nervous/anxious and does not have insomnia.    Past Medical History:  Diagnosis Date   AICD (automatic cardioverter/defibrillator) present 08/31/2019   Ankylosing spondylitis ([place])    Arthritis    BENIGN PROSTATIC HYPERTROPHY 06/07/2008   CHF (congestive heart failure) ([place])    COLONIC POLYPS, HX OF 06/07/2008   Depression    H/O hiatal hernia    Heart failure ([place])    HYPERLIPIDEMIA 06/07/2008   HYPERTENSION 06/07/2008   Myocardial infarction [facility]) 2005   NSTEMI, s/p LAD stent   NEPHROLITHIASIS, HX OF 06/07/2008   STEMI (ST elevation myocardial infarction) ([place]) 04/27/2019    Outpatient Medications Prior to Visit  Medication Sig Dispense Refill   acetaminophen (TYLENOL) 500 MG tablet Take 1,000 mg by mouth every 6 (six) hours as needed (pain.).     atorvastatin (LIPITOR) 80 MG tablet TAKE 1 TABLET BY MOUTH ONCE DAILY AT 6PM. (Patient taking differently: Take 80 mg by mouth every evening.) 90 tablet 3   Carboxymethylcellulose Sodium (ARTIFICIAL TEARS OP) Place 1 drop into both eyes 3 (three) times daily as needed (for dryness or irritation).     cefTRIAXone (ROCEPHIN) IVPB Inject 2 g into the vein daily. Indication:  Possible endocarditis First Dose: Yes Last Day of Therapy:  05/22/21 Labs - Once weekly:  CBC/D and BMP, Labs - Every other week:  ESR and CRP  Method of administration: IV Push Method of administration may be changed at the discretion of home infusion pharmacist based upon assessment of the patient and/or caregiver's ability to self-administer the medication ordered. 41 Units 0   digoxin (LANOXIN) 0.125 MG tablet Take 1 tablet (0.125 mg total) by mouth daily. 30 tablet 6   eplerenone (INSPRA) 50 MG tablet Take 0.5 tablets (25 mg total) by mouth in the morning.  30 tablet 6   ezetimibe (ZETIA) 10 MG tablet TAKE ONE TABLET BY MOUTH DAILY (Patient taking differently: Take 10 mg by mouth daily.) 90 tablet 3   gabapentin (NEURONTIN) 300 MG capsule Take 1 capsule (300 mg total) by mouth 3 (three) times daily. 90 capsule 5   losartan (COZAAR) 25 MG tablet Take 1/2 tablet (12.5 mg total) by mouth daily. 30 tablet 5   melatonin 5 MG TABS Take 1 tablet (5 mg total) by mouth at bedtime. 30 tablet 6   nystatin ointment (MYCOSTATIN) Apply 1 application topically 2 (two) times daily. 30 g 3   ondansetron (ZOFRAN) 4 MG tablet Take 1 tablet (4 mg total) by mouth every 8 (eight) hours as needed for nausea or vomiting. 20 tablet 3   pantoprazole (PROTONIX) 40 MG tablet Take 1 tablet (40 mg total) by mouth daily. 30 tablet 6   potassium chloride SA (KLOR-CON) 20 MEQ tablet Take 1 tablet (20 mEq total) by mouth daily. 30 tablet 6   sertraline (ZOLOFT) 100 MG tablet Take 1 tablet (100 mg total) by mouth daily. 30 tablet 2   tamsulosin (FLOMAX) 0.4 MG CAPS capsule TAKE 1 CAPSULE(0.4 MG) BY MOUTH DAILY (Patient taking differently: Take 0.4 mg by mouth daily.) 90 capsule 1   warfarin (COUMADIN) 2.5 MG tablet Take 2.5 mg (1 tablet) every Monday/Friday and 5 mg (2 tablets) all other days or as instructed by LVAD clinic. 75 tablet 11   doxycycline (VIBRA-TABS) 100 MG tablet Take 1 tablet (100 mg total) by mouth 2 (two) times daily. 82 tablet 0   diphenhydrAMINE (BENADRYL) 25 MG tablet Take 50 mg by mouth at bedtime. (Patient not taking: Reported on 05/18/2021)     torsemide (DEMADEX) 20 MG tablet Take 1 tablet (20 mg total) by mouth as needed. (Patient not taking: Reported on 05/24/2021) 30 tablet 5   traMADol (ULTRAM) 50 MG tablet Take 1 tablet (50 mg total) by mouth every 6 (six) hours as needed for moderate pain. (Patient not taking: Reported on 05/24/2021) 30 tablet 0   No facility-administered medications prior to visit.     No Active Allergies  Social History   Tobacco Use    Smoking status: Former    Packs/day: 1.00    Years: 40.00    Pack years: 40.00    Types: Cigarettes    Quit date: 07/26/2019    Years since quitting: 1.8   Smokeless tobacco: Never   Tobacco comments:    1-1.5 pks per year x 40-45 yrs  Vaping Use   Vaping Use: Never used  Substance Use Topics   Alcohol use: Yes    Alcohol/week: 7.0 standard drinks    Types: 7 Shots of liquor per week    Comment: "2 vodka tonics a night"   Drug use: Yes    Comment: occasionally - CBD oil    Family History  Problem Relation Age of Onset   Depression Father    Arthritis Sister        RA   Heart failure Mother    Other  Neg Hx    Colon cancer Neg Hx    Esophageal cancer Neg Hx    Rectal cancer Neg Hx    Stomach cancer Neg Hx    Colon polyps Neg Hx     Objective:   Vitals:   05/24/21 1353  BP: 110/82  Temp: 98.1 F (36.7 C)  TempSrc: Temporal  Weight: 173 lb 6.4 oz (78.7 kg)   Body mass index is 23.52 kg/m.  Physical Exam Constitutional:      Appearance: He is well-developed.     Comments: Seated comfortably in chair during visit.   HENT:     Mouth/Throat:     Dentition: Normal dentition. No dental abscesses.  Cardiovascular:     Rate and Rhythm: Normal rate and regular rhythm.     Heart sounds: Normal heart sounds.     Comments: LVAD humm Pulmonary:     Effort: Pulmonary effort is normal.     Breath sounds: Normal breath sounds.  Abdominal:     General: There is no distension.     Palpations: Abdomen is soft.     Tenderness: There is no abdominal tenderness.  Lymphadenopathy:     Cervical: No cervical adenopathy.  Skin:    General: Skin is warm and dry.     Findings: No rash.  Neurological:     Mental Status: He is alert and oriented to person, place, and time.  Psychiatric:        Judgment: Judgment normal.     Comments: In good spirits today and engaged in care discussion.   LUE PICC line - clean/dry dressing. Insertion site w/o erythema, tenderness,  drainage, cording or distal swelling of affected extremity    Lab Results: Lab Results  Component Value Date   WBC 8.8 05/24/2021   HGB 10.8 (L) 05/24/2021   HCT 34.9 (L) 05/24/2021   MCV 79.7 (L) 05/24/2021   PLT 233 05/24/2021    Lab Results  Component Value Date   CREATININE 0.59 (L) 05/24/2021   BUN 11 05/24/2021   NA 136 05/24/2021   K 3.7 05/24/2021   CL 104 05/24/2021   CO2 22 05/24/2021    Lab Results  Component Value Date   ALT 26 04/17/2021   AST 25 04/17/2021   ALKPHOS 90 04/17/2021   BILITOT 0.6 04/17/2021     Assessment & Plan:   Problem List Items Addressed This Visit       Unprioritized   Aortic valve endocarditis    Histopathology of excised aortic valve during LVAD implant with concern for acute endocarditis. All intraoperative, blood and 16S rna sequencing has returned negative. Given his bacteremia preceding aortic valve surgery 2 months prior, pathology of the valve most likely related to recently treated native valve streptococcal gordonii endocarditis and not a new infectious process. He was retreated following LVAD implant conservatively and tolerated ceftriaxone well without any trouble with PICC line. Home Health has been notified to remove PICC line. He does not need any surveillance blood cultures given all culture and molecular testing was negative.  He can follow up here PRN.       LVAD (left ventricular assist device) present ([place])    Seems to be recovering well following surgery. He is appreciative of his VAD team.  Dental ppx discussed via [place] message - will give amoxicillin 2g x 2 doses for this years cleanings.        Janene Madeira, MSN, NP-C [facility] for [facility]ious [place]  Group Pager: [phone] Office: [phone]  05/25/21  3:12 PM

## 2021-05-24 NOTE — Assessment & Plan Note (Addendum)
Seems to be recovering well following surgery. He is appreciative of his VAD team.  Dental ppx discussed via [place] message - will give amoxicillin 2g x 2 doses for this years cleanings.

## 2021-05-24 NOTE — Patient Instructions (Addendum)
Coumadin - Take 7.5 mg (3 tabs) tonight. Then resume a dose of  2.5 mg (1 tab) on Monday and Friday and then 5 mg (2 tabs) on all other days.  Return to clinic next week for INR check Return to clinic in 2 weeks to see Dr. [name]

## 2021-05-24 NOTE — Telephone Encounter (Signed)
Thanks Tiffany!

## 2021-05-24 NOTE — Patient Instructions (Signed)
Will get your PICC line pulled - may need your home health team to pull it. We shall see if we can before you leave.   All of the bacteria testing we did for your heart valve came back negative for any DNA (genetic material) which is pretty definitve your valve probably showed evidence of an old infection.   No further antibiotics.   Take good care of your driveline site and your teeth!

## 2021-05-24 NOTE — Progress Notes (Signed)
LVAD INR 

## 2021-05-24 NOTE — Telephone Encounter (Signed)
Verbal orders given to Amy with [facility] per Janene Madeira, NP - pull PICC line today 05/24/2021 - IV abx ended on 05/22/2021. Amy repeated orders back to me and voiced her understanding.     [place], CMA

## 2021-05-24 NOTE — Assessment & Plan Note (Addendum)
Histopathology of excised aortic valve during LVAD implant with concern for acute endocarditis. All intraoperative, blood and 16S rna sequencing has returned negative. Given his bacteremia preceding aortic valve surgery 2 months prior, pathology of the valve most likely related to recently treated native valve streptococcal gordonii endocarditis and not a new infectious process. He was retreated following LVAD implant conservatively and tolerated ceftriaxone well without any trouble with PICC line. Home Health has been notified to remove PICC line. He does not need any surveillance blood cultures given all culture and molecular testing was negative.  He can follow up here PRN.

## 2021-05-24 NOTE — Progress Notes (Signed)
Heart and Vascular Care Navigation  05/24/2021  Brandon [name] 04/17/1956 [id number]  Reason for Referral: Patient sen in LVAD clinic for follow up.   Engaged with patient face to face for follow up visit for Heart and Vascular Care Coordination.                                                                                                   Assessment:   Patient is a 66 yo male who lives alone in a single family home. Patient has a strong support system and states he is doing well at home post LVAD implant. Patient spoke about "sometimes I feel down but I will then ask myself what am I going to do about it". Patient shared family history of depression although appears to have good coping skills.  Patient scored a 0 on the PHQ-2.  Patient aware of LVAD support group and plans to attend next meeting.                               HRT/VAS Care Coordination     Patients Home Cardiology Office Heart Failure Clinic   Outpatient Care Team Social Worker   Social Worker Name: [name], [place] [phone]   Living arrangements for the past 2 months Single Family Home   Lives with: Self   Patient Current Insurance Sports coach; Traditional Medicare   Patient Has Concern With Paying Medical Bills No   Does Patient Have Prescription Coverage? Yes   Home Assistive Devices/Equipment Other (Comment); Scales; Walker (specify type); Oxygen  LVAD Patient   DME Agency [employer]; [place] ([facility])   Current home services --  rolling walker, bedside commode, oxygen ([facility])       Social History:                                                                             SDOH Screenings   Alcohol Screen: Not on file  Depression (PHQ2-9): Low Risk    PHQ-2 Score: 0  Financial Resource Strain: Low Risk    Difficulty of Paying Living Expenses: Not hard at all  Food Insecurity: No Food Insecurity   Worried About Charity fundraiser in the  Last Year: Never true   Arboriculturist in the Last Year: Never true  Housing: Low Risk    Last Housing Risk Score: 0  Physical Activity: Not on file  Social Connections: Not on file  Stress: Not on file  Tobacco Use: Medium Risk   Smoking Tobacco Use: Former   Smokeless Tobacco Use: Never   Passive Exposure: Not on file  Transportation Needs: No Transportation Needs   Lack of Transportation (Medical): No  Lack of Transportation (Non-Medical): No    SDOH Interventions: Financial Resources:  Sales promotion account executive Interventions: Intervention Not Indicated N/a  Food Insecurity:  Food Insecurity Interventions: Intervention Not Indicated  Housing Insecurity:  Housing Interventions: Intervention Not Indicated  Transportation:   Transportation Interventions: Intervention Not Indicated   Follow-up plan:  Patient appears to be in good spirits and has good caregiver support. Patient plans to attend next LVAD Support Group meeting.CSW will continue to follow for support as needed. [name], [place], [place]

## 2021-05-24 NOTE — Progress Notes (Signed)
Patient presents for 1 week f/u in [facility] today with his friend [name]. Reports no problems with VAD equipment or concerns with drive line.  Denies falls, denies dizziness, heart failure symptoms, and signs of bleeding. Reports intermittent nausea at times when taking medications. Instructed to make sure he is eating prior to medication administration. Pt has taken Zofran twice this past week.  Drive line dressing change performed today. See documentation below.   Left arm PICC in place. Taking IV Rocephin daily with end date of 05/22/21. He has follow up with ID 05/24/21. Plan for PICC to be d/c at that time per ID recommendations. Pt has [facility] thru [facility].   Reports that home OT discharged him, but he has not had any PT yet. He reports they are supposed to call him this week.   Vital Signs:  Doppler Pressure: 100 Automatc BP: 95/56 (76) HR:  70 SPO2: UTO %   Weight: 173.8 lb w/ eqt Last weight: 166.8 lb Home weights: 167.5 - 173 lbs BMI today 22.62 today     VAD Interrogation: Speed: 5800 Flow: 5.2 Power: 4.9 w    PI: 4.1 Alarms: 1 no external power - pt states that he accidentally double disconnected Events: 8 - 10 daily  Fixed speed: 5800 Low speed limit: 5500  Primary controller: back up battery due for replacement in 31 months Secondary controller:  back up battery due for replacement in 30 months    I reviewed the LVAD parameters from today and compared the results to the patient's prior recorded data. LVAD interrogation was NEGATIVE for significant power changes, NEGATIVE for clinical alarms and STABLE for PI events/speed drops. No programming changes were made and pump is functioning within specified parameters. Pt is performing daily controller and system monitor self tests along with completing weekly and monthly maintenance for LVAD equipment.   LVAD equipment check completed and is in good working order. Back-up equipment not present. Charged back up  battery and performed self-test on equipment.    Annual Equipment Maintenance on UBC/PM was performed on 12/22.    Exit Site Care: Existing VAD dressing and anchor removed and site care performed using sterile technique. Drive line exit site cleaned with Chlora prep applicators x 2, allowed to dry Sorbaview dressing with Silverlon re-applied. Exit site healing and partially incorporated.The velour is fully implanted at exit site. No redness, tenderness, drainage or foul odor noted. Reddened rash noted under anchor sites. Anchors repositioned and re-applied. Tape placed under anchor. Pt denies fever or chills. Continue weekly dressing changes using weekly kit. If rash clears, pt may be able to shower at his next visit. Pt has sufficient dressing kits at home.        BP & Labs:  MAP 100 - Doppler is reflecting modified systolic   Hgb 96.2 - No S/S of bleeding. Specifically denies melena/BRBPR or nosebleeds.   LDH stable at 157 with established baseline of 135 - 309. Denies tea-colored urine. No power elevations noted on interrogation.    Plan:  Coumadin - Take 7.5 mg (3 tabs) tonight. Then resume a dose of  2.5 mg (1 tab) on Monday and Friday and then 5 mg (2 tabs) on all other days.  Continue weekly dressing changes - may be able to shower at next visit if rash from anchor clears. Return to clinic next week for INR check Return to clinic in 2 weeks to see Dr. [name] w/Dig level--wasn't drawn at todays visit.  Tanda Rockers  RN VAD Coordinator  Office: [phone]  24/7 Pager: [phone]

## 2021-05-25 ENCOUNTER — Encounter (HOSPITAL_COMMUNITY): Payer: Self-pay

## 2021-05-25 ENCOUNTER — Encounter (HOSPITAL_COMMUNITY): Payer: Medicare Other

## 2021-05-25 MED ORDER — AMOXICILLIN 500 MG PO CAPS: 2000.0000 mg | ORAL_CAPSULE | Freq: Once | ORAL | 1 refills | Status: DC

## 2021-05-25 NOTE — Addendum Note (Signed)
Addended by: [place] Callas on: 05/25/2021 03:12 PM   Modules accepted: Orders

## 2021-05-28 ENCOUNTER — Ambulatory Visit: Payer: Medicare Other

## 2021-05-29 ENCOUNTER — Telehealth (HOSPITAL_COMMUNITY): Payer: Self-pay | Admitting: *Deleted

## 2021-05-29 ENCOUNTER — Other Ambulatory Visit (HOSPITAL_COMMUNITY): Payer: Self-pay | Admitting: *Deleted

## 2021-05-29 ENCOUNTER — Ambulatory Visit (INDEPENDENT_AMBULATORY_CARE_PROVIDER_SITE_OTHER): Payer: Medicare Other

## 2021-05-29 DIAGNOSIS — Z95811 Presence of heart assist device: Secondary | ICD-10-CM

## 2021-05-29 DIAGNOSIS — M456 Ankylosing spondylitis lumbar region: Secondary | ICD-10-CM

## 2021-05-29 DIAGNOSIS — Z7901 Long term (current) use of anticoagulants: Secondary | ICD-10-CM

## 2021-05-29 DIAGNOSIS — J90 Pleural effusion, not elsewhere classified: Secondary | ICD-10-CM

## 2021-05-29 DIAGNOSIS — I10 Essential (primary) hypertension: Secondary | ICD-10-CM

## 2021-05-29 DIAGNOSIS — I5022 Chronic systolic (congestive) heart failure: Secondary | ICD-10-CM

## 2021-05-29 DIAGNOSIS — I2510 Atherosclerotic heart disease of native coronary artery without angina pectoris: Secondary | ICD-10-CM

## 2021-05-29 DIAGNOSIS — E785 Hyperlipidemia, unspecified: Secondary | ICD-10-CM

## 2021-05-29 NOTE — Chronic Care Management (AMB) (Signed)
Chronic Care Management   CCM RN Visit Note  05/29/2021 Name: Brandon [name] MRN: [id number] DOB: November 02, 1955  Subjective: Brandon [name] is a 66 y.o. year old male who is a primary care patient of [name], MD. The care management team was consulted for assistance with disease management and care coordination needs.    Engaged with patient by telephone for initial visit in response to provider referral for case management and/or care coordination services.   Consent to Services:  The patient was given the following information about Chronic Care Management services today, agreed to services, and gave verbal consent: 1. CCM service includes personalized support from designated clinical staff supervised by the primary care provider, including individualized plan of care and coordination with other care providers 2. 24/7 contact phone numbers for assistance for urgent and routine care needs. 3. Service will only be billed when office clinical staff spend 20 minutes or more in a month to coordinate care. 4. Only one practitioner may furnish and bill the service in a calendar month. 5.The patient may stop CCM services at any time (effective at the end of the month) by phone call to the office staff. 6. The patient will be responsible for cost sharing (co-pay) of up to 20% of the service fee (after annual deductible is met). Patient agreed to services and consent obtained.  Patient agreed to services and verbal consent obtained.   Assessment: Review of patient past medical history, allergies, medications, health status, including review of consultants reports, laboratory and other test data, was performed as part of comprehensive evaluation and provision of chronic care management services.   SDOH (Social Determinants of Health) assessments and interventions performed:  SDOH Interventions    Flowsheet Row Most Recent Value  SDOH Interventions   Food Insecurity Interventions Intervention Not  Indicated  Financial Strain Interventions Intervention Not Indicated  Housing Interventions Intervention Not Indicated  Stress Interventions Intervention Not Indicated  Transportation Interventions Intervention Not Indicated  Depression Interventions/Treatment  Medication        CCM Care Plan  No Active Allergies  Outpatient Encounter Medications as of 05/29/2021  Medication Sig   acetaminophen (TYLENOL) 500 MG tablet Take 1,000 mg by mouth every 6 (six) hours as needed (pain.).   atorvastatin (LIPITOR) 80 MG tablet TAKE 1 TABLET BY MOUTH ONCE DAILY AT 6PM. (Patient taking differently: Take 80 mg by mouth every evening.)   Carboxymethylcellulose Sodium (ARTIFICIAL TEARS OP) Place 1 drop into both eyes 3 (three) times daily as needed (for dryness or irritation).   cefTRIAXone (ROCEPHIN) IVPB Inject 2 g into the vein daily. Indication:  Possible endocarditis First Dose: Yes Last Day of Therapy:  05/22/21 Labs - Once weekly:  CBC/D and BMP, Labs - Every other week:  ESR and CRP Method of administration: IV Push Method of administration may be changed at the discretion of home infusion pharmacist based upon assessment of the patient and/or caregiver's ability to self-administer the medication ordered. (Patient not taking: Reported on 05/29/2021)   digoxin (LANOXIN) 0.125 MG tablet Take 1 tablet (0.125 mg total) by mouth daily.   diphenhydrAMINE (BENADRYL) 25 MG tablet Take 50 mg by mouth at bedtime. (Patient not taking: Reported on 05/18/2021)   eplerenone (INSPRA) 50 MG tablet Take 0.5 tablets (25 mg total) by mouth in the morning.   ezetimibe (ZETIA) 10 MG tablet TAKE ONE TABLET BY MOUTH DAILY (Patient taking differently: Take 10 mg by mouth daily.)   gabapentin (NEURONTIN) 300 MG capsule Take  1 capsule (300 mg total) by mouth 3 (three) times daily.   losartan (COZAAR) 25 MG tablet Take 1/2 tablet (12.5 mg total) by mouth daily.   melatonin 5 MG TABS Take 1 tablet (5 mg total) by mouth at  bedtime.   nystatin ointment (MYCOSTATIN) Apply 1 application topically 2 (two) times daily.   ondansetron (ZOFRAN) 4 MG tablet Take 1 tablet (4 mg total) by mouth every 8 (eight) hours as needed for nausea or vomiting.   pantoprazole (PROTONIX) 40 MG tablet Take 1 tablet (40 mg total) by mouth daily.   potassium chloride SA (KLOR-CON) 20 MEQ tablet Take 1 tablet (20 mEq total) by mouth daily.   sertraline (ZOLOFT) 100 MG tablet Take 1 tablet (100 mg total) by mouth daily.   tamsulosin (FLOMAX) 0.4 MG CAPS capsule TAKE 1 CAPSULE(0.4 MG) BY MOUTH DAILY (Patient taking differently: Take 0.4 mg by mouth daily.)   torsemide (DEMADEX) 20 MG tablet Take 1 tablet (20 mg total) by mouth as needed. (Patient not taking: Reported on 05/24/2021)   traMADol (ULTRAM) 50 MG tablet Take 1 tablet (50 mg total) by mouth every 6 (six) hours as needed for moderate pain. (Patient not taking: Reported on 05/24/2021)   warfarin (COUMADIN) 2.5 MG tablet Take 2.5 mg (1 tablet) every Monday/Friday and 5 mg (2 tablets) all other days or as instructed by LVAD clinic.   No facility-administered encounter medications on file as of 05/29/2021.    Patient Active Problem List   Diagnosis Date Noted   Long term (current) use of anticoagulants 05/15/2021   LVAD (left ventricular assist device) present ([place]) 04/06/2021   Protein-calorie malnutrition, severe 03/14/2021   ILD (interstitial lung disease) ([place])    Ankylosing spondylitis ([facility])    Chronic systolic CHF (congestive heart failure) ([place]) 03/07/2021   Acute on chronic systolic (congestive) heart failure ([place]) 01/08/2021   Dental caries    Malnutrition of moderate degree 12/01/2020   Pressure injury of skin 12/01/2020   Cholelithiases    Aortic valve endocarditis    Elevated brain natriuretic peptide (BNP) level 11/30/2020   Elevated troponin 11/30/2020   Hypokalemia 11/30/2020   Hypomagnesemia 11/30/2020   Normocytic anemia 11/30/2020   Dyspnea    Infected  defibrillator ([place])    Bacteremia    Central line infection    Acute respiratory failure with hypoxia and hypercapnia ([place]) 11/29/2020   Centrilobular emphysema ([place]) 08/09/2020   Benign neoplasm of colon    Heart failure ([place]) 12/31/2019   Ischemic cardiomyopathy 08/31/2019   STEMI (ST elevation myocardial infarction) ([place]) 04/27/2019   Acute anterior wall MI ([place]) 04/27/2019   Mild aortic stenosis 12/02/2012   Ankylosis of sacroiliac joint 03/08/2011   TOBACCO ABUSE 03/13/2009   Hyperlipidemia with target LDL less than 70 06/07/2008   Essential hypertension 06/07/2008   CAD (coronary artery disease) 06/07/2008   BENIGN PROSTATIC HYPERTROPHY 06/07/2008   History of colon polyps 06/07/2008   NEPHROLITHIASIS, HX OF 06/07/2008    Conditions to be addressed/monitored:CHF, CAD, HTN, HLD, and LVAD, ankylosing spondylitis  Care Plan : RN Care Manager Plan of Care  Updates made by Dimitri Ped, RN since 05/29/2021 12:00 AM     Problem: Chronic Disease Management and Care Coordination Needs (LVAD, CAD, CHF,HTN,  HLD, ankylosing spondylitis)   Priority: High     Long-Range Goal: Establish Plan of Care for Chronic Disease Management Needs (LVAD, CAD, CHF,HTN,  HLD, ankylosing spondylitis)   Start Date: 05/29/2021  Expected End Date: 11/25/2021  Priority: High  Note:   Current Barriers:  Knowledge Deficits related to plan of care for management of CHF, CAD, HTN, HLD, and LVAD, ankylosing spondylitis  Chronic Disease Management support and education needs related to CHF, CAD, HTN, HLD, and LVAD, ankylosing spondylitis  States that he is slowly getting stronger.  States he is [facility] getting [facility] which he does walking and strengthening exercises. States his LVAD site is healing with no signs of infection.  States he is weighing daily and he weighed 176.5 today.  States he trying to eat healthy and trying to gain some weight.  States he has not been drinking any  nutritional supplement since he came home.  States he has been having some back and shoulder pain and he does not like taking the tramadol.    RNCM Clinical Goal(s):  Patient will verbalize understanding of plan for management of CHF, CAD, HTN, HLD, and LVAD, ankylosing spondylitis as evidenced by voiced adherence to plan of care verbalize basic understanding of  CHF, CAD, HTN, HLD, and LVAD, ankylosing spondylitis disease process and self health management plan as evidenced by voiced understanding and teach back take all medications exactly as prescribed and will call provider for medication related questions as evidenced by dispense report and pt verbalization attend all scheduled medical appointments: anti-coag 05/31/21, VAD clinic 06/07/21 as evidenced by medical records demonstrate Improved adherence to prescribed treatment plan for CHF, CAD, HTN, HLD, and LVAD, ankylosing spondylitis as evidenced by readings within limits, voiced adherence to plan of care continue to work with RN Care Manager to address care management and care coordination needs related to  CHF, CAD, HTN, HLD, and LVAD, ankylosing spondylitis as evidenced by adherence to CM Team Scheduled appointments through collaboration with RN Care manager, provider, and care team.   Interventions: 1:1 collaboration with primary care provider regarding development and update of comprehensive plan of care as evidenced by provider attestation and co-signature Inter-disciplinary care team collaboration (see longitudinal plan of care) Evaluation of current treatment plan related to  self management and patient's adherence to plan as established by provider  LVAD  (Status:  New goal.)  Long Term Goal Evaluation of current treatment plan related to  LVAD  ,  LVAD  self-management and patient's adherence to plan as established by provider. Discussed plans with patient for ongoing care management follow up and provided patient with direct contact  information for care management team Evaluation of current treatment plan related to LVAD and patient's adherence to plan as established by provider Provided education to patient re: LVAD Reviewed scheduled/upcoming provider appointments including Screening for signs and symptoms of depression related to chronic disease state  Assessed social determinant of health barriers  Pain /ankylosing spondylitisInterventions:  (Status:  New goal.) Long Term Goal Pain assessment performed Medications reviewed Reviewed provider established plan for pain management Discussed importance of adherence to all scheduled medical appointments Counseled on the importance of reporting any/all new or changed pain symptoms or management strategies to pain management provider Advised patient to report to care team affect of pain on daily activities Discussed use of relaxation techniques and/or diversional activities to assist with pain reduction (distraction, imagery, relaxation, massage, acupressure, TENS, heat, and cold application Reviewed to schedule appointment with provider to discuss pain control not related to his LVAD    CAD Interventions: (Status:  New goal.) Long Term Goal Assessed understanding of CAD diagnosis Medications reviewed including medications utilized in CAD treatment plan Counseled on importance  of regular laboratory monitoring as prescribed Reviewed Importance of taking all medications as prescribed Reviewed Importance of attending all scheduled provider appointments   Heart Failure Interventions:  (Status:  New goal.) Long Term Goal Provided education on low sodium diet Reviewed Heart Failure Action Plan in depth and provided written copy Discussed importance of daily weight and advised patient to weigh and record daily   Hyperlipidemia Interventions:  (Status:  New goal.) Long Term Goal Medication review performed; medication list updated in electronic medical record.  Provider  established cholesterol goals reviewed Counseled on importance of regular laboratory monitoring as prescribed Reviewed role and benefits of statin for ASCVD risk reduction  Hypertension Interventions:  (Status:  New goal.) Long Term Goal Last practice recorded BP readings:  BP Readings from Last 3 Encounters:  05/24/21 (!) 100/0  05/24/21 110/82  05/18/21 (!) 94/53  Most recent eGFR/CrCl: No results found for: EGFR  No components found for: CRCL  Evaluation of current treatment plan related to hypertension self management and patient's adherence to plan as established by provider Provided education to patient re: stroke prevention, s/s of heart attack and stroke Screening for signs and symptoms of depression related to chronic disease state   Surgery (LVAD insertion):  (Status: New goal.) Short Term Goal Evaluation of current treatment plan related to LVAD surgery assessed patient/caregiver understanding of surgical procedure   reviewed post-operative instructions with patient/caregiver addressed questions about post - surgical incision care  Reviewed s/sx of infection of insertion site to call provider. Reviewed to try to eat a well balanced low sodium diet with protein to help with healing  Patient Goals/Self-Care Activities: Take all medications as prescribed Attend all scheduled provider appointments Call pharmacy for medication refills 3-7 days in advance of running out of medications Perform all self care activities independently  Call provider office for new concerns or questions  call office if I gain more than 2 pounds in one day or 5 pounds in one week keep legs up while sitting watch for swelling in feet, ankles and legs every day weigh myself daily follow rescue plan if symptoms flare-up eat more whole grains, fruits and vegetables, lean meats and healthy fats keep all doctor appointments call for medicine refill 2 or 3 days before it runs out take all medications  exactly as prescribed call doctor with any symptoms you believe are related to your medicine  Follow Up Plan:  Telephone follow up appointment with care management team member scheduled for:  07/02/21 The patient has been provided with contact information for the care management team and has been advised to call with any health related questions or concerns.       Plan:Telephone follow up appointment with care management team member scheduled for:  07/02/21 The patient has been provided with contact information for the care management team and has been advised to call with any health related questions or concerns.  Amahd Garter RN, Jackquline Denmark, CDE Care Management Coordinator [facility] [phone], Mobile [phone]

## 2021-05-29 NOTE — Telephone Encounter (Signed)
Patient called VAD office to report left upper shoulder pain 8/10 on pain scale and worse with inspiration. Describes it as pain similar to what he felt in hospital "before they drained the fluid off my lung". Pt taking Tylenol and Tramadol for pain. Dr. [name] updated, called patient with instructions for CXR tomorrow. Moved Thurs lab appt to tomorrow as well. Pt says he can come tomorrow afternoon. Instructed pt to call if symptoms worsen or do not improve. Pt verbalized understanding of above.   Zada Girt RN, [place] Coordinator [phone]

## 2021-05-29 NOTE — Patient Instructions (Addendum)
Visit Information   Thank you for taking time to visit with me today. Please don't hesitate to contact me if I can be of assistance to you before our next scheduled telephone appointment.  Following are the goals we discussed today:  Take all medications as prescribed Attend all scheduled provider appointments Call pharmacy for medication refills 3-7 days in advance of running out of medications Perform all self care activities independently  Call provider office for new concerns or questions  call office if I gain more than 2 pounds in one day or 5 pounds in one week keep legs up while sitting watch for swelling in feet, ankles and legs every day weigh myself daily follow rescue plan if symptoms flare-up eat more whole grains, fruits and vegetables, lean meats and healthy fats keep all doctor appointments call for medicine refill 2 or 3 days before it runs out take all medications exactly as prescribed call doctor with any symptoms you believe are related to your medicine Ventricular Assist Device Home Guide It is important to follow instructions from your health care provider about caring for your ventricular assist device (VAD) at home. You will continue to work with a health care provider who specializes in VADs (VAD coordinator) and a heart specialist (cardiologist). How to care for the VAD Check the area around your VAD tube for signs of infection. Do this every day, or as often as told by your health care provider. Check for: Redness. Swelling. Blood or fluid coming from the opening. Warmth. A bad smell. Check the following numbers every day. Write them down. Your temperature. Your blood pressure. Your weight. The VAD speed (rpm). The VAD pulsatility index (PI). The VAD power. The VAD flow (L/min). How to change the bandage around the VAD You will have a bandage (dressing) around the area where the VAD tube exits your abdomen. The dressing consists of a drain sponge,  gauze, and adhesive tape. Your VAD coordinator may help you order the supplies that you need to change your dressing. To remove your VAD dressing and put on a new dressing, do the following steps: Put on a mask. Wash your hands with soap and water. Put on sterile gloves. Remove the dressing and throw it away. Do not touch the VAD tubing or the skin near it after you remove the dressing. Remove your gloves and put on a new pair of sterile gloves. Clean the area around the VAD tube. Use a sterile cleaning solution as told by your health care provider. Gently dry your skin with sterile gauze. Apply the drain sponge to the VAD tubing site. Cover the drain sponge with a gauze pad. Secure the dressing with adhesive tape. Change your dressing as often as told by your health care provider. Traveling with a VAD Get permission from your health care provider before you travel. You will need a letter from your health care provider that explains your medical condition and VAD equipment. Make sure that your VAD coordinator can reach you when you travel. Find VAD centers and health care facilities close to your travel destinations. Do not pass through metal detectors or radiation detectors. Security may need to inspect your external VAD equipment. Always keep the following items with you: Extra equipment for your VAD (power pack and charged batteries). Phone numbers for your emergency contacts. Follow these instructions at home: Medicines Take over-the-counter and prescription medicines only as told by your health care provider. If you were prescribed an antibiotic or blood-thinning medicine, take  it as told by your health care provider. Do not stop taking the antibiotic even if you start to feel better. Activity Return to your normal activities as told by your health care provider. Ask your health care provider what activities are safe for you. Avoid physical activity in very hot or very cold  weather. Do not vacuum. Do not play contact sports or do intense physical activities. Do not drive or use heavy machinery. Lifestyle  Do not use any products that contain nicotine or tobacco, such as cigarettes, e-cigarettes, and chewing tobacco. If you need help quitting, ask your health care provider. Avoid places where you may be exposed to secondhand smoke. Do not sleep on your abdomen. If you drink alcohol: Limit how much you use to: 0-1 drink a day for women. 0-2 drinks a day for men. Be aware of how much alcohol is in your drink. In the [place] one drink equals one 12 oz bottle of beer (355 mL), one 5 oz glass of wine (148 mL), or one 1 oz glass of hard liquor (44 mL). Bathing  Do not expose the VAD to water. This can cause VAD pump failure. Cover your VAD equipment when you shower. Your health care provider will show you how to do this. Do not take baths, swim, or use a hot tub until your health care provider approves. Ask your health care provider if you may take showers. You may only be allowed to take sponge baths. General instructions Make sure you understand: The parts of your VAD and how they work. The VAD alarm functions. How to change your VAD dressing. What to do in case of an emergency. When leaving home, take a spare system controller and extra VAD batteries with you. Keep all follow-up visits as told by your health care provider. This is important. You will need to have blood tests frequently. Contact a health provider if you: Have swelling in your feet, ankles, or legs. Gain more than 3 lb (1.36 kg) in 1 day. Have signs of infection (fever, redness, pain, swelling, warmth, or a bad smell) in the area where your VAD tube exits your abdomen. Feel nauseous, vomit, or have pain in your abdomen. Have a lot of bruises. Have blood or fluid coming from the opening. See your VAD coordinator if you: Have questions or concerns about your VAD. Need to order supplies for  your VAD. Notice a VAD flow rate, PI, or power level that is abnormal. Get help right away if: You have: Difficulty breathing. Chest pain. Pain that spreads to your arm, neck, jaw, back, or abdomen. Blood in your vomit or your stool. You cough up blood. Your VAD stops working or loses power. Your VAD becomes damaged. A VAD alarm goes off. You have any symptoms of a stroke. "BE FAST" is an easy way to remember the main warning signs of a stroke: B - Balance. Signs are dizziness, sudden trouble walking, or loss of balance. E - Eyes. Signs are trouble seeing or a sudden change in vision. F - Face. Signs are sudden weakness or numbness of the face, or the face or eyelid drooping on one side. A - Arms. Signs are weakness or numbness in an arm. This happens suddenly and usually on one side of the body. S - Speech. Signs are sudden trouble speaking, slurred speech, or trouble understanding what people say. T - Time. Time to call emergency services. Write down what time symptoms started. You have other signs of a  stroke, such as: A sudden, severe headache with no known cause. Nausea or vomiting. Seizure. These symptoms may represent a serious problem that is an emergency. Do not wait to see if the symptoms will go away. Get medical help right away. Call your local emergency services (911 in the [place] Do not drive yourself to the hospital. Summary Check the area around your VAD tube for signs of infection. Do this every day, or as often as told by your health care provider. When leaving home, take a spare system controller and extra VAD batteries with you. Return to your normal activities as told by your health care provider. Ask your health care provider what activities are safe for you. Make sure you know when you need to contact your VAD coordinator. This information is not intended to replace advice given to you by your health care provider. Make sure you discuss any questions you have with  your health care provider. Document Revised: 10/11/2020 Document Reviewed: 10/11/2020 Elsevier Patient Education  2022 [place] next appointment is by telephone on 07/02/21 at 10 AM  Please call the care guide team at [phone] if you need to cancel or reschedule your appointment.   If you are experiencing a Mental Health or [place] or need someone to talk to, please call the Suicide and Crisis Lifeline: 988 call the [place] National Suicide Prevention Lifeline: 9801963286 or TTY: 212-482-0713 TTY (740) 584-8806) to talk to a trained counselor call 1-800-273-TALK (toll free, 24 hour hotline) go to [name] [address], [place] [phone]) call 911   Following is a copy of your full care plan:  Care Plan : [place] of Care  Updates made by Brandon Ped, RN since 05/29/2021 12:00 AM     Problem: Chronic Disease Management and Care Coordination Needs (LVAD, CAD, CHF,HTN,  HLD, ankylosing spondylitis)   Priority: High     Long-Range Goal: Establish Plan of Care for Chronic Disease Management Needs (LVAD, CAD, CHF,HTN,  HLD, ankylosing spondylitis)   Start Date: 05/29/2021  Expected End Date: 11/25/2021  Priority: High  Note:   Current Barriers:  Knowledge Deficits related to plan of care for management of CHF, CAD, HTN, HLD, and LVAD, ankylosing spondylitis  Chronic Disease Management support and education needs related to CHF, CAD, HTN, HLD, and LVAD, ankylosing spondylitis  States that he is slowly getting stronger.  States he is [facility] which he does walking and strengthening exercises. States his LVAD site is healing with no signs of infection.  States he is weighing daily and he weighed 176.5 today.  States he trying to eat healthy and trying to gain some weight.  States he has not been drinking any nutritional supplement since he came home.  States he has been  having some back and shoulder pain and he does not like taking the tramadol.    RNCM Clinical Goal(s):  Patient will verbalize understanding of plan for management of CHF, CAD, HTN, HLD, and LVAD, ankylosing spondylitis as evidenced by voiced adherence to plan of care verbalize basic understanding of  CHF, CAD, HTN, HLD, and LVAD, ankylosing spondylitis disease process and self health management plan as evidenced by voiced understanding and teach back take all medications exactly as prescribed and will call provider for medication related questions as evidenced by dispense report and pt verbalization attend all scheduled medical appointments: anti-coag 05/31/21, VAD clinic 06/07/21 as evidenced by medical  records demonstrate Improved adherence to prescribed treatment plan for CHF, CAD, HTN, HLD, and LVAD, ankylosing spondylitis as evidenced by readings within limits, voiced adherence to plan of care continue to work with RN Care Manager to address care management and care coordination needs related to  CHF, CAD, HTN, HLD, and LVAD, ankylosing spondylitis as evidenced by adherence to CM Team Scheduled appointments through collaboration with RN Care manager, provider, and care team.   Interventions: 1:1 collaboration with primary care provider regarding development and update of comprehensive plan of care as evidenced by provider attestation and co-signature Inter-disciplinary care team collaboration (see longitudinal plan of care) Evaluation of current treatment plan related to  self management and patient's adherence to plan as established by provider  LVAD  (Status:  New goal.)  Long Term Goal Evaluation of current treatment plan related to  LVAD  ,  LVAD  self-management and patient's adherence to plan as established by provider. Discussed plans with patient for ongoing care management follow up and provided patient with direct contact information for care management team Evaluation of current  treatment plan related to LVAD and patient's adherence to plan as established by provider Provided education to patient re: LVAD Reviewed scheduled/upcoming provider appointments including Screening for signs and symptoms of depression related to chronic disease state  Assessed social determinant of health barriers  Pain /ankylosing spondylitisInterventions:  (Status:  New goal.) Long Term Goal Pain assessment performed Medications reviewed Reviewed provider established plan for pain management Discussed importance of adherence to all scheduled medical appointments Counseled on the importance of reporting any/all new or changed pain symptoms or management strategies to pain management provider Advised patient to report to care team affect of pain on daily activities Discussed use of relaxation techniques and/or diversional activities to assist with pain reduction (distraction, imagery, relaxation, massage, acupressure, TENS, heat, and cold application Reviewed to schedule appointment with provider to discuss pain control not related to his LVAD    CAD Interventions: (Status:  New goal.) Long Term Goal Assessed understanding of CAD diagnosis Medications reviewed including medications utilized in CAD treatment plan Counseled on importance of regular laboratory monitoring as prescribed Reviewed Importance of taking all medications as prescribed Reviewed Importance of attending all scheduled provider appointments   Heart Failure Interventions:  (Status:  New goal.) Long Term Goal Provided education on low sodium diet Reviewed Heart Failure Action Plan in depth and provided written copy Discussed importance of daily weight and advised patient to weigh and record daily   Hyperlipidemia Interventions:  (Status:  New goal.) Long Term Goal Medication review performed; medication list updated in electronic medical record.  Provider established cholesterol goals reviewed Counseled on importance  of regular laboratory monitoring as prescribed Reviewed role and benefits of statin for ASCVD risk reduction  Hypertension Interventions:  (Status:  New goal.) Long Term Goal Last practice recorded BP readings:  BP Readings from Last 3 Encounters:  05/24/21 (!) 100/0  05/24/21 110/82  05/18/21 (!) 94/53  Most recent eGFR/CrCl: No results found for: EGFR  No components found for: CRCL  Evaluation of current treatment plan related to hypertension self management and patient's adherence to plan as established by provider Provided education to patient re: stroke prevention, s/s of heart attack and stroke Screening for signs and symptoms of depression related to chronic disease state   Surgery (LVAD insertion):  (Status: New goal.) Short Term Goal Evaluation of current treatment plan related to LVAD surgery assessed patient/caregiver understanding of surgical procedure   reviewed  post-operative instructions with patient/caregiver addressed questions about post - surgical incision care  Reviewed s/sx of infection of insertion site to call provider. Reviewed to try to eat a well balanced low sodium diet with protein to help with healing  Patient Goals/Self-Care Activities: Take all medications as prescribed Attend all scheduled provider appointments Call pharmacy for medication refills 3-7 days in advance of running out of medications Perform all self care activities independently  Call provider office for new concerns or questions  call office if I gain more than 2 pounds in one day or 5 pounds in one week keep legs up while sitting watch for swelling in feet, ankles and legs every day weigh myself daily follow rescue plan if symptoms flare-up eat more whole grains, fruits and vegetables, lean meats and healthy fats keep all doctor appointments call for medicine refill 2 or 3 days before it runs out take all medications exactly as prescribed call doctor with any symptoms you believe  are related to your medicine  Follow Up Plan:  Telephone follow up appointment with care management team member scheduled for:  07/02/21 The patient has been provided with contact information for the care management team and has been advised to call with any health related questions or concerns.       Consent to CCM Services: Brandon [name] was given information about Chronic Care Management services including:  CCM service includes personalized support from designated clinical staff supervised by his physician, including individualized plan of care and coordination with other care providers 24/7 contact phone numbers for assistance for urgent and routine care needs. Service will only be billed when office clinical staff spend 20 minutes or more in a month to coordinate care. Only one practitioner may furnish and bill the service in a calendar month. The patient may stop CCM services at any time (effective at the end of the month) by phone call to the office staff. The patient will be responsible for cost sharing (co-pay) of up to 20% of the service fee (after annual deductible is met).  Patient agreed to services and verbal consent obtained.   Patient verbalizes understanding of instructions and care plan provided today and agrees to view in [place]. Active MyChart status confirmed with patient.    Telephone follow up appointment with care management team member scheduled for: 07/02/21 at [place] RN, [facility], CDE Care Management Coordinator [facility] [phone], Mobile [phone]

## 2021-05-29 NOTE — Progress Notes (Signed)
LVAD Follow-up Clinic Note  PCP: [name], MD HF MD: DB   HPI:  Brandon [name] is a 66 year old with history of tobacco use, CAD s/p previous anterior MI, severe systolic HF s/p HM-3 VAD 02/77/41, severe AS s/p AVR, endocarditis.    Admitted 12/20 with anterior ST elevation MI. Cath with chronically occluded RCA (L>>R collaterals) and thrombotic occlusion of proximal LAD treated with PCI. Echo w/ reduced LVEF 30-35% w/ apical aneurysm. RV ok. Post cath, he required milrinone for cardiogenic shock. Subsequently underwent Barostim.   Underwent outpatient [facility] 11/29/20 as part of VAD w/u. Post procedure, developed severe rigors and fever. Admitted to ICU Blood Cx + strep. 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  Likely low-flow low gradient AS. Underwent ICD extraction..    Completed IV antibiotics on 01/16/21. Saw ID and had negative repeat blood cultures on 01/30/21.  Admitted for cardiogenic shock in 10/22.  He underwent placement of HM3 VAD and aortic valve replacement with 25 mm Edwards Inspiris Resilia valve on 04/06/21. Surgical path came back 11/28 with evidence of possible "acute aortic valve endocarditis". 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 Discharged 05/09/21.     Follow up for Heart Failure/LVAD: Here for first post-VAD clinic visit with his friend Ivin Booty. Doing very well. Getting stronger. Has just finished IV abx. Appetite improved. Denies orthopnea or PND. No fevers, chills or problems with driveline. No bleeding, melena or neuro symptoms. No VAD alarms. Taking all meds as prescribed.      VAD Interrogation: Speed: 5800 Flow: 5.2 Power: 4.9 w    PI: 4.1 Alarms: 1 no external power - pt states that he accidentally double  disconnected Events: 8 - 10 daily  Fixed speed: 5800 Low speed limit: 5500   Primary controller: back up battery due for replacement in 31 months Secondary controller:  back up battery due for replacement in 30 months    I reviewed the LVAD parameters from today and compared the results to the patient's prior recorded data. LVAD interrogation was NEGATIVE for significant power changes, NEGATIVE for clinical alarms and STABLE for PI events/speed drops. No programming changes were made and pump is functioning within specified parameters. Pt is performing daily controller and system monitor self tests along with completing weekly and monthly maintenance for LVAD equipment.   LVAD equipment check completed and is in good working order. Back-up equipment not present. Charged back up battery and performed self-test on equipment.    Annual Equipment Maintenance on UBC/PM was performed on 12/22.    Past Medical History:  Diagnosis Date   AICD (automatic cardioverter/defibrillator) present 08/31/2019   Ankylosing spondylitis ([place])    Arthritis    BENIGN PROSTATIC HYPERTROPHY 06/07/2008   CHF (congestive heart failure) ([place])    COLONIC POLYPS, HX OF 06/07/2008   Depression    H/O hiatal hernia    Heart failure ([place])    HYPERLIPIDEMIA 06/07/2008   HYPERTENSION 06/07/2008   Myocardial infarction [facility]) 2005   NSTEMI, s/p LAD stent   NEPHROLITHIASIS, HX OF 06/07/2008   STEMI (ST elevation myocardial infarction) ([place]) 04/27/2019    Current Outpatient Medications  Medication Sig Dispense Refill   acetaminophen (TYLENOL) 500 MG tablet Take 1,000 mg by mouth every 6 (six) hours as needed (pain.).     atorvastatin (  LIPITOR) 80 MG tablet TAKE 1 TABLET BY MOUTH ONCE DAILY AT 6PM. (Patient taking differently: Take 80 mg by mouth every evening.) 90 tablet 3   cefTRIAXone (ROCEPHIN) IVPB Inject 2 g into the vein daily. Indication:  Possible endocarditis First Dose: Yes Last Day of Therapy:  05/22/21 Labs  - Once weekly:  CBC/D and BMP, Labs - Every other week:  ESR and CRP Method of administration: IV Push Method of administration may be changed at the discretion of home infusion pharmacist based upon assessment of the patient and/or caregiver's ability to self-administer the medication ordered. (Patient not taking: Reported on 05/29/2021) 41 Units 0   digoxin (LANOXIN) 0.125 MG tablet Take 1 tablet (0.125 mg total) by mouth daily. 30 tablet 6   eplerenone (INSPRA) 50 MG tablet Take 0.5 tablets (25 mg total) by mouth in the morning. 30 tablet 6   ezetimibe (ZETIA) 10 MG tablet TAKE ONE TABLET BY MOUTH DAILY (Patient taking differently: Take 10 mg by mouth daily.) 90 tablet 3   gabapentin (NEURONTIN) 300 MG capsule Take 1 capsule (300 mg total) by mouth 3 (three) times daily. 90 capsule 5   losartan (COZAAR) 25 MG tablet Take 1/2 tablet (12.5 mg total) by mouth daily. 30 tablet 5   melatonin 5 MG TABS Take 1 tablet (5 mg total) by mouth at bedtime. 30 tablet 6   nystatin ointment (MYCOSTATIN) Apply 1 application topically 2 (two) times daily. 30 g 3   ondansetron (ZOFRAN) 4 MG tablet Take 1 tablet (4 mg total) by mouth every 8 (eight) hours as needed for nausea or vomiting. 20 tablet 3   pantoprazole (PROTONIX) 40 MG tablet Take 1 tablet (40 mg total) by mouth daily. 30 tablet 6   potassium chloride SA (KLOR-CON) 20 MEQ tablet Take 1 tablet (20 mEq total) by mouth daily. 30 tablet 6   sertraline (ZOLOFT) 100 MG tablet Take 1 tablet (100 mg total) by mouth daily. 30 tablet 2   tamsulosin (FLOMAX) 0.4 MG CAPS capsule TAKE 1 CAPSULE(0.4 MG) BY MOUTH DAILY (Patient taking differently: Take 0.4 mg by mouth daily.) 90 capsule 1   Carboxymethylcellulose Sodium (ARTIFICIAL TEARS OP) Place 1 drop into both eyes 3 (three) times daily as needed (for dryness or irritation).     diphenhydrAMINE (BENADRYL) 25 MG tablet Take 50 mg by mouth at bedtime. (Patient not taking: Reported on 05/18/2021)     torsemide  (DEMADEX) 20 MG tablet Take 1 tablet (20 mg total) by mouth as needed. (Patient not taking: Reported on 05/24/2021) 30 tablet 5   traMADol (ULTRAM) 50 MG tablet Take 1 tablet (50 mg total) by mouth every 6 (six) hours as needed for moderate pain. (Patient not taking: Reported on 05/24/2021) 30 tablet 0   warfarin (COUMADIN) 2.5 MG tablet Take 2.5 mg (1 tablet) every Monday/Friday and 5 mg (2 tablets) all other days or as instructed by LVAD clinic. 75 tablet 11   No current facility-administered medications for this encounter.    Patient has no active allergies.    Vitals:   05/24/21 1002 05/24/21 1003  BP: (!) 95/56 (!) 100/0  Pulse: 70   Weight: 78.8 kg (173 lb 12.8 oz)   Height: 6' (1.829 m)     Vital Signs:  Doppler Pressure: 100 Automatc BP: 95/56 (76) HR:  70 SPO2: UTO %   Weight: 173.8 lb w/ eqt Last weight: 166.8 lb Home weights: 167.5 - 173 lbs BMI today 22.62 today  Physical Exam: General:  NAD.  HEENT: normal  Neck: supple. JVP not elevated.  Carotids 2+ bilat; no bruits. No lymphadenopathy or thryomegaly appreciated. Cor: LVAD hum.  Lungs: Clear. Abdomen: soft, nontender, non-distended. No hepatosplenomegaly. No bruits or masses. Good bowel sounds. Driveline site clean. Anchor in place.  Extremities: no cyanosis, clubbing, rash. Warm no edema  Neuro: alert & oriented x 3. No focal deficits. Moves all 4 without problem    ASSESSMENT AND PLAN:  1. Chronic systolic HFr EF due to iCM - Echo 05/19/20 EF 20-25% RV mildly HK. moderate AS  Mean gradient 13 AVA 1.2 cm2 DI 0.30 - s/p HM-III VAD + bioprosthetic AVR 04/06/21 - Improving post-VAD NYHA II-early III - Volume status looks good off diuretics  - Continue digoxin 0.125. - Continue Losartan 12.5 mg daily - Continue eplerenone 12.5 mg daily   2. HM-3 LVAD implant - VAD interrogated personally. Parameters stable. - ASA stopped 12/26 - INR 1.8. Goal 2.0-3.0  Discussed dosing with PharmD personally. - LDH  157 - MAPs ok    3. Severe low-flow aortic stenosis s/p AVR - s/p VAD/bioprosthetic AVR on 11/25   4. CAD  - s/p anterior STEMI (12/20). LHC showed chronically occluded RCA (with L>>R collaterals) and thrombotic occlusion of proximal LAD. Underwent PCI of LAD. - Now s/p VAD - No s/s angina - Continue atorvastatin 80.  - off ASA.   5. Severe protein-calorie malnutrition - improving   6. Paroxysmal Atrial Fibrillation w/ RVR - Remains in NSR  - Continue amio 200 mg once daily. Stop at next visit - on warfarin.    7. 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  - Bcx recollected and pending  - Tissue sent to [facility] in [place] for broad 16 S ribosomal sequencing for bacterial pathogens as well as T wet Bhilai Bartonella Brucella  - Ceftriaxone 2 g IV daily x 6 weeks. End Date: May 22, 2021. Can pull PICC - ICD removed   Total time spent 45 minutes. Over half that time spent discussing above.   Glori Bickers, MD  10:35 PM

## 2021-05-30 ENCOUNTER — Ambulatory Visit (HOSPITAL_COMMUNITY)
Admission: RE | Admit: 2021-05-30 | Discharge: 2021-05-30 | Disposition: A | Discharge status: Home / Self Care | Payer: Medicare Other | Source: Ambulatory Visit | Attending: Internal Medicine | Admitting: Internal Medicine

## 2021-05-30 ENCOUNTER — Telehealth (HOSPITAL_COMMUNITY): Payer: Self-pay | Admitting: Unknown Physician Specialty

## 2021-05-30 ENCOUNTER — Ambulatory Visit (HOSPITAL_COMMUNITY): Payer: Self-pay | Admitting: Pharmacist

## 2021-05-30 ENCOUNTER — Other Ambulatory Visit: Payer: Self-pay

## 2021-05-30 ENCOUNTER — Ambulatory Visit (HOSPITAL_COMMUNITY)
Admission: RE | Admit: 2021-05-30 | Discharge: 2021-05-30 | Disposition: A | Discharge status: Home / Self Care | Payer: Medicare Other | Source: Ambulatory Visit | Attending: Cardiology | Admitting: Cardiology

## 2021-05-30 DIAGNOSIS — J90 Pleural effusion, not elsewhere classified: Secondary | ICD-10-CM | POA: Insufficient documentation

## 2021-05-30 DIAGNOSIS — Z7901 Long term (current) use of anticoagulants: Secondary | ICD-10-CM | POA: Diagnosis not present

## 2021-05-30 DIAGNOSIS — Z95811 Presence of heart assist device: Secondary | ICD-10-CM | POA: Insufficient documentation

## 2021-05-30 DIAGNOSIS — R0989 Other specified symptoms and signs involving the circulatory and respiratory systems: Secondary | ICD-10-CM | POA: Insufficient documentation

## 2021-05-30 LAB — PROTIME-INR
INR: 2.2 — ABNORMAL HIGH (ref 0.8–1.2)
Prothrombin Time: 24.6 seconds — ABNORMAL HIGH (ref 11.4–15.2)

## 2021-05-30 NOTE — Progress Notes (Signed)
LVAD INR 

## 2021-05-30 NOTE — Telephone Encounter (Signed)
Called pt at home to go over chest xray results. Pt states that his shoulder pain has resolved but reports a 6 lb weight gain in the last few days and notes that he is more SOB than usual. Pt states that his urine output has decreased recently. Pt was instructed to take a prn Torsemide 20 mg today per Dr. [name]. Pt states that he has not taken any Torsemide recently.   Update: I spoke with the pt today who reports his SOB is better but weight has returned to baseline. Pt informed he can take another Torsemide today and may take another tomorrow if needed per Dr. [name]. Pt verbalized understanding of instructions.  Tanda Rockers RN, BSN VAD Coordinator 24/7 Pager [phone]

## 2021-05-31 ENCOUNTER — Other Ambulatory Visit (HOSPITAL_COMMUNITY): Payer: Medicare Other

## 2021-05-31 ENCOUNTER — Encounter (HOSPITAL_COMMUNITY): Payer: Self-pay

## 2021-06-06 ENCOUNTER — Other Ambulatory Visit (HOSPITAL_COMMUNITY): Payer: Self-pay | Admitting: *Deleted

## 2021-06-06 DIAGNOSIS — Z7901 Long term (current) use of anticoagulants: Secondary | ICD-10-CM

## 2021-06-06 DIAGNOSIS — Z79899 Other long term (current) drug therapy: Secondary | ICD-10-CM

## 2021-06-06 DIAGNOSIS — Z95811 Presence of heart assist device: Secondary | ICD-10-CM

## 2021-06-07 ENCOUNTER — Encounter (HOSPITAL_COMMUNITY): Payer: Self-pay

## 2021-06-07 ENCOUNTER — Ambulatory Visit (HOSPITAL_COMMUNITY): Payer: Self-pay | Admitting: Pharmacist

## 2021-06-07 ENCOUNTER — Other Ambulatory Visit: Payer: Self-pay

## 2021-06-07 ENCOUNTER — Ambulatory Visit (HOSPITAL_COMMUNITY)
Admission: RE | Admit: 2021-06-07 | Discharge: 2021-06-07 | Disposition: A | Discharge status: Home / Self Care | Payer: Medicare Other | Source: Ambulatory Visit | Attending: Internal Medicine | Admitting: Internal Medicine

## 2021-06-07 VITALS — BP 98/65 | HR 98 | Temp 97.8°F | Ht 72.0 in | Wt 174.4 lb

## 2021-06-07 DIAGNOSIS — I110 Hypertensive heart disease with heart failure: Secondary | ICD-10-CM | POA: Diagnosis not present

## 2021-06-07 DIAGNOSIS — Z79899 Other long term (current) drug therapy: Secondary | ICD-10-CM

## 2021-06-07 DIAGNOSIS — J90 Pleural effusion, not elsewhere classified: Secondary | ICD-10-CM | POA: Diagnosis not present

## 2021-06-07 DIAGNOSIS — I38 Endocarditis, valve unspecified: Secondary | ICD-10-CM | POA: Insufficient documentation

## 2021-06-07 DIAGNOSIS — I5022 Chronic systolic (congestive) heart failure: Secondary | ICD-10-CM

## 2021-06-07 DIAGNOSIS — E43 Unspecified severe protein-calorie malnutrition: Secondary | ICD-10-CM | POA: Insufficient documentation

## 2021-06-07 DIAGNOSIS — Z87891 Personal history of nicotine dependence: Secondary | ICD-10-CM | POA: Diagnosis not present

## 2021-06-07 DIAGNOSIS — I480 Paroxysmal atrial fibrillation: Secondary | ICD-10-CM | POA: Diagnosis not present

## 2021-06-07 DIAGNOSIS — E871 Hypo-osmolality and hyponatremia: Secondary | ICD-10-CM | POA: Insufficient documentation

## 2021-06-07 DIAGNOSIS — Z5181 Encounter for therapeutic drug level monitoring: Secondary | ICD-10-CM | POA: Diagnosis not present

## 2021-06-07 DIAGNOSIS — R5381 Other malaise: Secondary | ICD-10-CM

## 2021-06-07 DIAGNOSIS — I2510 Atherosclerotic heart disease of native coronary artery without angina pectoris: Secondary | ICD-10-CM | POA: Diagnosis not present

## 2021-06-07 DIAGNOSIS — I252 Old myocardial infarction: Secondary | ICD-10-CM | POA: Diagnosis not present

## 2021-06-07 DIAGNOSIS — I350 Nonrheumatic aortic (valve) stenosis: Secondary | ICD-10-CM | POA: Diagnosis not present

## 2021-06-07 DIAGNOSIS — Z8616 Personal history of COVID-19: Secondary | ICD-10-CM | POA: Insufficient documentation

## 2021-06-07 DIAGNOSIS — Z7901 Long term (current) use of anticoagulants: Secondary | ICD-10-CM | POA: Diagnosis not present

## 2021-06-07 DIAGNOSIS — Z953 Presence of xenogenic heart valve: Secondary | ICD-10-CM | POA: Insufficient documentation

## 2021-06-07 DIAGNOSIS — Z955 Presence of coronary angioplasty implant and graft: Secondary | ICD-10-CM | POA: Insufficient documentation

## 2021-06-07 DIAGNOSIS — I330 Acute and subacute infective endocarditis: Secondary | ICD-10-CM

## 2021-06-07 DIAGNOSIS — I5043 Acute on chronic combined systolic (congestive) and diastolic (congestive) heart failure: Secondary | ICD-10-CM

## 2021-06-07 DIAGNOSIS — Z95811 Presence of heart assist device: Secondary | ICD-10-CM | POA: Diagnosis not present

## 2021-06-07 LAB — DIGOXIN LEVEL: Digoxin Level: 0.7 ng/mL — ABNORMAL LOW (ref 0.8–2.0)

## 2021-06-07 LAB — PROTIME-INR
INR: 2.1 — ABNORMAL HIGH (ref 0.8–1.2)
Prothrombin Time: 23.6 seconds — ABNORMAL HIGH (ref 11.4–15.2)

## 2021-06-07 LAB — CBC
HCT: 37.3 % — ABNORMAL LOW (ref 39.0–52.0)
Hemoglobin: 11.2 g/dL — ABNORMAL LOW (ref 13.0–17.0)
MCH: 23.9 pg — ABNORMAL LOW (ref 26.0–34.0)
MCHC: 30 g/dL (ref 30.0–36.0)
MCV: 79.7 fL — ABNORMAL LOW (ref 80.0–100.0)
Platelets: 290 10*3/uL (ref 150–400)
RBC: 4.68 MIL/uL (ref 4.22–5.81)
RDW: 21.3 % — ABNORMAL HIGH (ref 11.5–15.5)
WBC: 7.6 10*3/uL (ref 4.0–10.5)
nRBC: 0 % (ref 0.0–0.2)

## 2021-06-07 LAB — BASIC METABOLIC PANEL
Anion gap: 8 (ref 5–15)
BUN: 13 mg/dL (ref 8–23)
CO2: 26 mmol/L (ref 22–32)
Calcium: 9.6 mg/dL (ref 8.9–10.3)
Chloride: 103 mmol/L (ref 98–111)
Creatinine, Ser: 0.71 mg/dL (ref 0.61–1.24)
GFR, Estimated: >60 mL/min (ref >60)
Glucose, Bld: 111 mg/dL — ABNORMAL HIGH (ref 70–99)
Potassium: 3.7 mmol/L (ref 3.5–5.1)
Sodium: 137 mmol/L (ref 135–145)

## 2021-06-07 LAB — LACTATE DEHYDROGENASE: LDH: 154 U/L (ref 98–192)

## 2021-06-07 NOTE — Progress Notes (Signed)
LVAD INR 

## 2021-06-07 NOTE — Progress Notes (Addendum)
Patient presents for 2 week f/u in [facility] today with his friend [name]. Reports no problems with VAD equipment or concerns with drive line.  Patient's chief complaint today is SOB. His friend [name] feels it happens more when he doesn't take torsemide. He says he has taken torsemide as directed on Thurs and Friday for reported weight gain and increased SOB. He took additional doses on Sun and Tues for SOB. He is unsure if this has helped with his SOB. Reports SOB occurs when walking long distances. He walked from parking deck to waiting room, waiting room to exam room and friend [name] says she noticed increased SOB. Pt denies any peripheral edema or abdominal distention.   Pt reports at recent MD visit, nurse told him his heart rate was 60. He is concerned because baseline HR is usually in 70's. He and [name] are asking if it could be Afib, since he had Afib during hospitalization. EKG obtained and reviewed by Dr. [name]. Reminded patient O2 sat, HR, and BP can be difficult to obtain accurately with HM III LVAD. Pt denies any palpitations or syncope. Orthostatics obtained and reviewed by Dr. [name], will stop Midodrine.   Patient brought home medications for review. Doxy 100 mg bid in bag; confirmed with Janene Madeira, ID he has not been prescribed this medication. This was given at time of hospital discharge with stop date of 05/22/21. Asked patient to stop if he is taking this now.  Pt had f/u with ID on 05/24/21 and IV antibiotics and PICC line were stopped. He was provided with Rx for amoxicillin 2g x 2 doses for dental prophylaxis to be taken prior to cleanings.   Dr. [name] explained that patient may have SOB due to physical deconditioning. Recommended cardiac rehab; referral sent.   Pt confirmed he received home INR machine and is scheduled for education tomorrow in his home. He will be able to do home INRs from this point forward.    Vital Signs:   Sitting:   Standing: Temp:  97.8 Doppler Pressure: 88 Automatc BP: 98/65 (86)  59/42 (43) HR:  98 SR    112 SPO2: 96% on RA   Weight: 174.4 lb w/ eqt Last weight: 173.8 lb Home weights: 173 - 176 lbs BMI today 23.6  today     VAD Interrogation: Speed: 5800 Flow: 5.0 Power: 4.8w    PI: 3.3 Alarms: none Events: 40 today;  60+ yesterday  Fixed speed: 5800 Low speed limit: 5500  Primary controller: back up battery due for replacement in 31 months Secondary controller:  back up battery due for replacement in 30 months    I reviewed the LVAD parameters from today and compared the results to the patient's prior recorded data. LVAD interrogation was NEGATIVE for significant power changes, NEGATIVE for clinical alarms and STABLE for PI events/speed drops. No programming changes were made and pump is functioning within specified parameters. Pt is performing daily controller and system monitor self tests along with completing weekly and monthly maintenance for LVAD equipment.   LVAD equipment check completed and is in good working order. Back-up equipment not present. Charged back up battery and performed self-test on equipment.    Annual Equipment Maintenance on UBC/PM was performed on 12/22.    Exit Site Care: Existing VAD dressing and anchor removed and site care performed using sterile technique. Sorbaview dressing x 2 with biopatch intact and NOT accurately applied. Drive line exit site cleaned with Chlora prep applicators x 2, allowed to  dry and gauze  dressing with silver strip applied and anchor replaced. Exit site healing and partially incorporated.The velour is fully implanted at exit site. Skin rash better since last visit per Dr. [name]. Site with small amount green/grey drainage with no redness, tenderness, or foul odor noted. Unable to express any drainage. Will change back to gauze dressing changes with silver strip twice weekly. Asked patient to call if drainage increases or has foul odor, tenderness,  or fever. Pt denies fever or chills. Patient will hold off on showers at this time. Provided patient with 7 daily kits and 7 anchors.     BP & Labs:  Doppler 88 - Doppler is reflecting MAP   Hgb 11.2 - No S/S of bleeding. Specifically denies melena/BRBPR or nosebleeds.   LDH stable at 154 with established baseline of 135 - 309. Denies tea-colored urine. No power elevations noted on interrogation.    Patient Instructions:  Stop Doxycycline (antibiotic) - you are currently taking 100 mg twice daily. Only take Torsemide as needed - no more than once or twice weekly. 3.   Advance dressing changes to twice weekly using daily kits and silver strip. Next change on Monday, call if site has any tenderness, drainage, or foul odor.  4.   Return to [facility] in 3 weeks.    Zada Girt RN [place] Coordinator  Office: [phone]  24/7 Pager: [phone]

## 2021-06-07 NOTE — Patient Instructions (Addendum)
Stop Doxycycline (antibiotic) - you are currently taking 100 mg twice daily. Only take Torsemide as needed - no more than once or twice weekly. 3.   Advance dressing changes to twice weekly using daily kits and silver strip. Next change on Monday, call if site has any tenderness, drainage, or foul odor.  4.   Return to [facility] in 3 weeks.

## 2021-06-10 NOTE — Progress Notes (Signed)
LVAD Follow-up Clinic Note  PCP: [name], MD HF MD: DB   HPI:  Brandon [name] is a 66 year old with history of tobacco use, CAD s/p previous anterior MI, severe systolic HF s/p HM-3 VAD 89/38/10, severe AS s/p AVR, endocarditis.    Admitted 12/20 with anterior ST elevation MI. Cath with chronically occluded RCA (L>>R collaterals) and thrombotic occlusion of proximal LAD treated with PCI. Echo w/ reduced LVEF 30-35% w/ apical aneurysm. RV ok. Post cath, he required milrinone for cardiogenic shock. Subsequently underwent Barostim.   Underwent outpatient [facility] 11/29/20 as part of VAD w/u. Post procedure, developed severe rigors and fever. Admitted to ICU Blood Cx + strep. 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  Likely low-flow low gradient AS. Underwent ICD extraction..    Completed IV antibiotics on 01/16/21. Saw ID and had negative repeat blood cultures on 01/30/21.  Admitted for cardiogenic shock in 10/22.  He underwent placement of HM3 VAD and aortic valve replacement with 25 mm Edwards Inspiris Resilia valve on 04/06/21. Surgical path came back 11/28 with evidence of possible "acute aortic valve endocarditis". 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 Discharged 05/09/21.    Follow up for Heart Failure/LVAD: Here with his friend Brandon [name]. Says he is doing much better. Getting stronger every day. Appetite good. He does have periods of dyspnea and will take torsemide to try and fix it. Denies orthopnea or PND. No fevers, chills or problems with driveline. No bleeding, melena or neuro symptoms. No VAD alarms. Taking all meds as prescribed. Has finished abx for endocarditis. PICC out.      VAD Interrogation: Speed: 5800 Flow: 5.0 Power: 4.8w    PI: 3.3 Alarms:  none Events: 40 today;  60+ yesterday  Fixed speed: 5800 Low speed limit: 5500   Primary controller: back up battery due for replacement in 31 months Secondary controller:  back up battery due for replacement in 30 months    I reviewed the LVAD parameters from today and compared the results to the patient's prior recorded data. LVAD interrogation was NEGATIVE for significant power changes, NEGATIVE for clinical alarms and STABLE for PI events/speed drops. No programming changes were made and pump is functioning within specified parameters. Pt is performing daily controller and system monitor self tests along with completing weekly and monthly maintenance for LVAD equipment.   LVAD equipment check completed and is in good working order. Back-up equipment not present. Charged back up battery and performed self-test on equipment.    Annual Equipment Maintenance on UBC/PM was performed on 12/22.    Past Medical History:  Diagnosis Date   AICD (automatic cardioverter/defibrillator) present 08/31/2019   Ankylosing spondylitis ([place])    Arthritis    BENIGN PROSTATIC HYPERTROPHY 06/07/2008   CHF (congestive heart failure) ([place])    COLONIC POLYPS, HX OF 06/07/2008   Depression    H/O hiatal hernia    Heart failure ([place])    HYPERLIPIDEMIA 06/07/2008   HYPERTENSION 06/07/2008   Myocardial infarction [facility]) 2005   NSTEMI, s/p LAD stent   NEPHROLITHIASIS, HX OF 06/07/2008   STEMI (ST elevation myocardial infarction) ([place]) 04/27/2019    Current Outpatient Medications  Medication Sig Dispense Refill   acetaminophen (TYLENOL) 500 MG tablet Take 1,000 mg by mouth every 6 (six) hours as needed (pain.).  atorvastatin (LIPITOR) 80 MG tablet TAKE 1 TABLET BY MOUTH ONCE DAILY AT 6PM. (Patient taking differently: Take 80 mg by mouth every evening.) 90 tablet 3   Carboxymethylcellulose Sodium (ARTIFICIAL TEARS OP) Place 1 drop into both eyes 3 (three) times daily as needed (for dryness or irritation).      digoxin (LANOXIN) 0.125 MG tablet Take 1 tablet (0.125 mg total) by mouth daily. 30 tablet 6   doxycycline (VIBRAMYCIN) 100 MG capsule Take 100 mg by mouth 2 (two) times daily.     eplerenone (INSPRA) 50 MG tablet Take 0.5 tablets (25 mg total) by mouth in the morning. 30 tablet 6   ezetimibe (ZETIA) 10 MG tablet TAKE ONE TABLET BY MOUTH DAILY (Patient taking differently: Take 10 mg by mouth daily.) 90 tablet 3   gabapentin (NEURONTIN) 300 MG capsule Take 1 capsule (300 mg total) by mouth 3 (three) times daily. 90 capsule 5   losartan (COZAAR) 25 MG tablet Take 1/2 tablet (12.5 mg total) by mouth daily. 30 tablet 5   melatonin 5 MG TABS Take 1 tablet (5 mg total) by mouth at bedtime. 30 tablet 6   nystatin ointment (MYCOSTATIN) Apply 1 application topically 2 (two) times daily. 30 g 3   pantoprazole (PROTONIX) 40 MG tablet Take 1 tablet (40 mg total) by mouth daily. 30 tablet 6   potassium chloride SA (KLOR-CON) 20 MEQ tablet Take 1 tablet (20 mEq total) by mouth daily. 30 tablet 6   sertraline (ZOLOFT) 100 MG tablet Take 1 tablet (100 mg total) by mouth daily. 30 tablet 2   tamsulosin (FLOMAX) 0.4 MG CAPS capsule TAKE 1 CAPSULE(0.4 MG) BY MOUTH DAILY (Patient taking differently: Take 0.4 mg by mouth daily.) 90 capsule 1   torsemide (DEMADEX) 20 MG tablet Take 1 tablet (20 mg total) by mouth as needed. 30 tablet 5   warfarin (COUMADIN) 2.5 MG tablet Take 2.5 mg (1 tablet) every Monday/Friday and 5 mg (2 tablets) all other days or as instructed by LVAD clinic. 75 tablet 11   diphenhydrAMINE (BENADRYL) 25 MG tablet Take 50 mg by mouth at bedtime. (Patient not taking: Reported on 05/18/2021)     ondansetron (ZOFRAN) 4 MG tablet Take 1 tablet (4 mg total) by mouth every 8 (eight) hours as needed for nausea or vomiting. (Patient not taking: Reported on 06/07/2021) 20 tablet 3   traMADol (ULTRAM) 50 MG tablet Take 1 tablet (50 mg total) by mouth every 6 (six) hours as needed for moderate pain. (Patient not  taking: Reported on 05/24/2021) 30 tablet 0   No current facility-administered medications for this encounter.    Patient has no active allergies.    Vitals:   06/07/21 1437 06/07/21 1438  BP: (!) 88/0 98/65  Pulse:  98  Temp:  97.8 F (36.6 C)  TempSrc:  Oral  SpO2:  96%  Weight:  79.1 kg (174 lb 6.4 oz)  Height:  6' (1.829 m)    Vital Signs:   Sitting:                         Standing: Temp: 97.8 Doppler Pressure: 88 Automatc BP: 98/65 (86)                    59/42 (43) HR:  98 SR  112 SPO2: 96% on RA   Weight: 174.4 lb w/ eqt Last weight: 173.8 lb Home weights: 173 - 176 lbs BMI today 23.6  today  Physical Exam: General:  NAD.  HEENT: normal  Neck: supple. JVP not elevated.  Carotids 2+ bilat; no bruits. No lymphadenopathy or thryomegaly appreciated. Cor: LVAD hum.  Lungs: Clear. Abdomen: obese soft, nontender, non-distended. No hepatosplenomegaly. No bruits or masses. Good bowel sounds. Driveline site clean. Anchor in place.  Extremities: no cyanosis, clubbing, rash. Warm no edema  Neuro: alert & oriented x 3. No focal deficits. Moves all 4 without problem     ASSESSMENT AND PLAN:  1. Chronic systolic HFr EF due to iCM - Echo 05/19/20 EF 20-25% RV mildly HK. moderate AS  Mean gradient 13 AVA 1.2 cm2 DI 0.30 - s/p HM-III VAD + bioprosthetic AVR 04/06/21 - Improving post-VAD NYHA II - He is orthostatic on exam. Told him to strictly limit torsemide use to times when weight is climbing and he is edematous. Encouraged fluid/slt intake today - Continue digoxin 0.125. - Continue Losartan 12.5 mg daily - Continue eplerenone 12.5 mg daily - Will refer to CR    2. HM-3 LVAD implant - VAD interrogated personally. Parameters stable. Does have PI events related to volume depletion - ASA stopped 12/26 - INR 2.1 Goal 2.0-3.0 Discussed dosing with PharmD personally. - LDH 154 - MAPs ok sitting, Orthostatic as above. Limit  torsemide    3. Severe low-flow aortic stenosis s/p AVR - s/p VAD/bioprosthetic AVR on 11/25   4. CAD  - s/p anterior STEMI (12/20). LHC showed chronically occluded RCA (with L>>R collaterals) and thrombotic occlusion of proximal LAD. Underwent PCI of LAD. - Now s/p VAD - NO s/s angina - Continue atorvastatin 80.  - off ASA.   5. Severe protein-calorie malnutrition - improving   6. Paroxysmal Atrial Fibrillation w/ RVR - Remains in NSR  - Off amio - on warfarin.    7. 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  - Bcx recollected and pending  - Tissue sent to [facility] in [place] for broad 16 S ribosomal sequencing for bacterial pathogens as well as T wet Bhilai Bartonella Brucella  - Ceftriaxone 2 g IV daily x 6 weeks. End Date: May 22, 2021. PICC is out - ICD removed  Total time spent 45 minutes. Over half that time spent discussing above.    Glori Bickers, MD  1:57 PM

## 2021-06-12 ENCOUNTER — Ambulatory Visit (HOSPITAL_COMMUNITY): Payer: Self-pay | Admitting: Pharmacist

## 2021-06-12 ENCOUNTER — Telehealth (HOSPITAL_COMMUNITY): Payer: Self-pay

## 2021-06-12 DIAGNOSIS — M456 Ankylosing spondylitis lumbar region: Secondary | ICD-10-CM | POA: Diagnosis not present

## 2021-06-12 DIAGNOSIS — I110 Hypertensive heart disease with heart failure: Secondary | ICD-10-CM | POA: Diagnosis not present

## 2021-06-12 DIAGNOSIS — E785 Hyperlipidemia, unspecified: Secondary | ICD-10-CM | POA: Diagnosis not present

## 2021-06-12 DIAGNOSIS — I509 Heart failure, unspecified: Secondary | ICD-10-CM | POA: Diagnosis not present

## 2021-06-12 DIAGNOSIS — I2510 Atherosclerotic heart disease of native coronary artery without angina pectoris: Secondary | ICD-10-CM

## 2021-06-12 LAB — POCT INR: INR: 2.3 (ref 2.0–3.0)

## 2021-06-12 NOTE — Telephone Encounter (Signed)
Pt insurance is active and benefits verified through Medicare a/b Co-pay 0, DED $226/$194.79 met, out of pocket 0/0 met, co-insurance 20%. no pre-authorization required. Passport, 06/12/2021@11 :44am, REF# [id number]   2ndary insurance is active and benefits verified through [facility]. Co-pay 0, DED 0/0 met, out of pocket 0/0 met, co-insurance 0%. No pre-authorization required. Passport, 06/12/2021@11 :54am, REF# [id number]     Will contact patient to see if he is interested in the Cardiac Rehab Program. If interested, patient will need to complete follow up appt. Once completed, patient will be contacted for scheduling upon review by the RN Navigator.

## 2021-06-12 NOTE — Progress Notes (Signed)
LVAD INR 

## 2021-06-14 ENCOUNTER — Ambulatory Visit (HOSPITAL_COMMUNITY): Payer: Self-pay | Admitting: Pharmacist

## 2021-06-14 ENCOUNTER — Other Ambulatory Visit (HOSPITAL_COMMUNITY): Payer: Self-pay | Admitting: Cardiology

## 2021-06-14 LAB — POCT INR: INR: 2 (ref 2.0–3.0)

## 2021-06-14 NOTE — Progress Notes (Signed)
LVAD INR 

## 2021-06-15 ENCOUNTER — Other Ambulatory Visit (HOSPITAL_COMMUNITY): Payer: Self-pay

## 2021-06-16 ENCOUNTER — Other Ambulatory Visit (HOSPITAL_COMMUNITY): Payer: Self-pay

## 2021-06-21 ENCOUNTER — Ambulatory Visit (HOSPITAL_COMMUNITY): Payer: Self-pay | Admitting: Pharmacist

## 2021-06-21 LAB — POCT INR: INR: 1.9 — AB (ref 2.0–3.0)

## 2021-06-21 NOTE — Progress Notes (Signed)
LVAD INR 

## 2021-06-22 ENCOUNTER — Other Ambulatory Visit (HOSPITAL_COMMUNITY): Payer: Self-pay

## 2021-06-23 ENCOUNTER — Other Ambulatory Visit (HOSPITAL_COMMUNITY): Payer: Self-pay

## 2021-06-27 ENCOUNTER — Other Ambulatory Visit (HOSPITAL_COMMUNITY): Payer: Self-pay | Admitting: Unknown Physician Specialty

## 2021-06-27 DIAGNOSIS — Z7901 Long term (current) use of anticoagulants: Secondary | ICD-10-CM

## 2021-06-27 DIAGNOSIS — Z95811 Presence of heart assist device: Secondary | ICD-10-CM

## 2021-06-27 DIAGNOSIS — E064 Drug-induced thyroiditis: Secondary | ICD-10-CM

## 2021-06-28 ENCOUNTER — Other Ambulatory Visit: Payer: Self-pay

## 2021-06-28 ENCOUNTER — Ambulatory Visit (HOSPITAL_COMMUNITY): Payer: Self-pay | Admitting: Pharmacist

## 2021-06-28 ENCOUNTER — Ambulatory Visit (HOSPITAL_COMMUNITY)
Admission: RE | Admit: 2021-06-28 | Discharge: 2021-06-28 | Disposition: A | Discharge status: Home / Self Care | Payer: Medicare Other | Source: Ambulatory Visit | Attending: Cardiology | Admitting: Cardiology

## 2021-06-28 VITALS — BP 100/0 | HR 100 | Wt 182.6 lb

## 2021-06-28 DIAGNOSIS — E064 Drug-induced thyroiditis: Secondary | ICD-10-CM | POA: Diagnosis not present

## 2021-06-28 DIAGNOSIS — Z7901 Long term (current) use of anticoagulants: Secondary | ICD-10-CM | POA: Diagnosis not present

## 2021-06-28 DIAGNOSIS — Z5181 Encounter for therapeutic drug level monitoring: Secondary | ICD-10-CM | POA: Diagnosis not present

## 2021-06-28 DIAGNOSIS — I2510 Atherosclerotic heart disease of native coronary artery without angina pectoris: Secondary | ICD-10-CM

## 2021-06-28 DIAGNOSIS — I480 Paroxysmal atrial fibrillation: Secondary | ICD-10-CM

## 2021-06-28 DIAGNOSIS — Z95811 Presence of heart assist device: Secondary | ICD-10-CM | POA: Diagnosis present

## 2021-06-28 DIAGNOSIS — Z4801 Encounter for change or removal of surgical wound dressing: Secondary | ICD-10-CM | POA: Insufficient documentation

## 2021-06-28 DIAGNOSIS — I5022 Chronic systolic (congestive) heart failure: Secondary | ICD-10-CM

## 2021-06-28 LAB — CBC
HCT: 38.2 % — ABNORMAL LOW (ref 39.0–52.0)
Hemoglobin: 11.7 g/dL — ABNORMAL LOW (ref 13.0–17.0)
MCH: 25 pg — ABNORMAL LOW (ref 26.0–34.0)
MCHC: 30.6 g/dL (ref 30.0–36.0)
MCV: 81.6 fL (ref 80.0–100.0)
Platelets: 206 10*3/uL (ref 150–400)
RBC: 4.68 MIL/uL (ref 4.22–5.81)
RDW: 22.1 % — ABNORMAL HIGH (ref 11.5–15.5)
WBC: 7.5 10*3/uL (ref 4.0–10.5)
nRBC: 0 % (ref 0.0–0.2)

## 2021-06-28 LAB — COMPREHENSIVE METABOLIC PANEL
ALT: 17 U/L (ref 0–44)
AST: 20 U/L (ref 15–41)
Albumin: 3.4 g/dL — ABNORMAL LOW (ref 3.5–5.0)
Alkaline Phosphatase: 118 U/L (ref 38–126)
Anion gap: 8 (ref 5–15)
BUN: 9 mg/dL (ref 8–23)
CO2: 25 mmol/L (ref 22–32)
Calcium: 9 mg/dL (ref 8.9–10.3)
Chloride: 102 mmol/L (ref 98–111)
Creatinine, Ser: 0.75 mg/dL (ref 0.61–1.24)
GFR, Estimated: >60 mL/min (ref >60)
Glucose, Bld: 91 mg/dL (ref 70–99)
Potassium: 3.5 mmol/L (ref 3.5–5.1)
Sodium: 135 mmol/L (ref 135–145)
Total Bilirubin: 0.5 mg/dL (ref 0.3–1.2)
Total Protein: 7.2 g/dL (ref 6.5–8.1)

## 2021-06-28 LAB — PROTIME-INR
INR: 1.7 — ABNORMAL HIGH (ref 0.8–1.2)
Prothrombin Time: 20 seconds — ABNORMAL HIGH (ref 11.4–15.2)

## 2021-06-28 LAB — DIGOXIN LEVEL: Digoxin Level: 0.6 ng/mL — ABNORMAL LOW (ref 0.8–2.0)

## 2021-06-28 LAB — PREALBUMIN: Prealbumin: 14.5 mg/dL — ABNORMAL LOW (ref 18–38)

## 2021-06-28 LAB — LACTATE DEHYDROGENASE: LDH: 126 U/L (ref 98–192)

## 2021-06-28 MED ORDER — TRAMADOL HCL 50 MG PO TABS: 50.0000 mg | ORAL_TABLET | Freq: Four times a day (QID) | ORAL | 0 refills | Status: DC | PRN

## 2021-06-28 NOTE — Progress Notes (Signed)
Patient presents for 3 week f/u in [facility] today with his friend [name]. Reports no problems with VAD equipment or concerns with drive line.  Patient's chief complaint today pain. Pt has not been on Remicade for quite some time and is out of Tramadol. Pt will get an appt with his Rheumatologist to restart his medication regimen for Ankylosing spondylitis.  Pt confirmed that he stopped Doxycycline and Midodrine at last visit. Pt states that he has not taken a fluid pill since his last visit. Pt states that he doesn't feel like he has fluid on board but is gaining weight because he has regained his appetite.   Pt is on the list for CR; he spoke with CR team on Monday and pt states "they are backed up." Hopefully pt can start CR soon.  Pt has been doing home INR checks and he tells me this is going well.   Driveline site looks great. Will advance to weekly dressing changes. See below for full driveline site care.   Vital Signs:     Doppler Pressure: 96 Automatc BP: (100)   HR:  100 ST     SPO2: 98% on RA   Weight: 182.6 lb w/ eqt Last weight: 174.4 lb     VAD Interrogation: Speed: 5800 Flow: 5.1 Power: 5.0w    PI: 3.4 Alarms: none Events: 2-5 daily  Fixed speed: 5800 Low speed limit: 5500  Primary controller: back up battery due for replacement in 30 months Secondary controller:  back up battery due for replacement in 29 months    I reviewed the LVAD parameters from today and compared the results to the patient's prior recorded data. LVAD interrogation was NEGATIVE for significant power changes, NEGATIVE for clinical alarms and STABLE for PI events/speed drops. No programming changes were made and pump is functioning within specified parameters. Pt is performing daily controller and system monitor self tests along with completing weekly and monthly maintenance for LVAD equipment.   LVAD equipment check completed and is in good working order. Back-up equipment not present.  Charged back up battery and performed self-test on equipment.    Annual Equipment Maintenance on UBC/PM was performed on 12/22.    Exit Site Care: Existing VAD dressing and anchor removed and site care performed using sterile technique. Drive line exit site cleaned with Chlora prep applicators x 2, Rinsed w/saline, allowed to dry and Sorbaview  dressing with Silverlon applied and anchor replaced. Exit site healing and incorporated.The velour is fully implanted at exit site. Will advance to weekly dressings. Pt informed that if site looks good at next visit we can provide him with a shower bag and teach him how to use it.  Provided patient with 4 weekly kits.     BP & Labs:  Doppler 96 - Doppler is reflecting MAP   Hgb 11.7 - No S/S of bleeding. Specifically denies melena/BRBPR or nosebleeds.   LDH stable at 126 with established baseline of 135 - 309. Denies tea-colored urine. No power elevations noted on interrogation.   3 mo. Intermacs follow up completed including:  Quality of Life, KCCQ-12, and Neurocognitive trail making.   Pt completed 1000 feet during 6 minute walk.  Patient Goals: Begin CR.  [place] Cardiomyopathy Questionnaire  KCCQ-12 06/28/2021 11/15/2020  1 a. Ability to shower/bathe Other, Did not do Not at all limited  1 b. Ability to walk 1 block Slightly limited Slightly limited  1 c. Ability to hurry/jog Moderately limited Extremely limited  2.  Edema feet/ankles/legs Never over the past 2 weeks Never over the past 2 weeks  3. Limited by fatigue 1-2 times a week Several times a day  4. Limited by dyspnea 1-2 times a week 3+ times a week, not every day  5. Sitting up / on 3+ pillows 1-2 times a week Never over the past 2 weeks  6. Limited enjoyment of life Moderately limited Limited quite a bit  7. Rest of life w/ symptoms Somewhat satisfied Not at all satisfied  8 a. Participation in hobbies Moderately limited Limited quite a bit  8 b. Participation in chores Slightly  limited Moderately limited  8 c. Visiting family/friends Slightly limited Moderately limited       Patient Instructions:  Stop Digoxin, Stop Potassium. 2.   Advance dressing changes to weekly. If site looks good at next visit we will give you a shower bag and teach you how to use it.  3.   Return to [facility] in 6 weeks.   Tanda Rockers  RN [place] Coordinator  Office: [phone]  24/7 Pager: [phone]

## 2021-06-28 NOTE — Patient Instructions (Signed)
Stop Digoxin, Stop Potassium. 2.   Advance dressing changes to weekly. If site looks good at next visit we will give you a shower bag and teach you how to use it.  3.   Return to [facility] in 6 weeks.

## 2021-06-28 NOTE — Progress Notes (Signed)
LVAD INR 

## 2021-06-30 NOTE — Progress Notes (Signed)
LVAD Follow-up Clinic Note  PCP: [name], MD HF MD: DB   HPI:  Brandon [name] is a 66 year old with history of tobacco use, CAD s/p previous anterior MI, severe systolic HF s/p HM-3 VAD 19/37/90, severe AS s/p AVR, endocarditis.    Admitted 12/20 with anterior ST elevation MI. Cath with chronically occluded RCA (L>>R collaterals) and thrombotic occlusion of proximal LAD treated with PCI. Echo w/ reduced LVEF 30-35% w/ apical aneurysm. RV ok. Post cath, he required milrinone for cardiogenic shock. Subsequently underwent Barostim.   Underwent outpatient [facility] 11/29/20 as part of VAD w/u. Post procedure, developed severe rigors and fever. Admitted to ICU Blood Cx + strep. 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  Likely low-flow low gradient AS. Underwent ICD extraction..    Completed IV antibiotics on 01/16/21. Saw ID and had negative repeat blood cultures on 01/30/21.  Admitted for cardiogenic shock in 10/22.  He underwent placement of HM3 VAD and aortic valve replacement with 25 mm Edwards Inspiris Resilia valve on 04/06/21. Surgical path came back 11/28 with evidence of possible "acute aortic valve endocarditis". 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 Discharged 05/09/21.    Follow up for Heart Failure/LVAD: Here with his friend [name]. Feels great. Much more active. Good appetite. Doing all routine activities without too much problem. Only complaint is that he has been off his Remicade and pain from Ankylosing Spondylitis has been flaring. Denies orthopnea or PND. No fevers, chills or problems with driveline. No bleeding, melena or neuro symptoms. No VAD alarms. Taking all meds as prescribed.    VAD Interrogation: Speed: 5800 Flow: 5.1 Power: 5.0w    PI:  3.4 Alarms: none Events: 2-5 daily  Fixed speed: 5800 Low speed limit: 5500   Primary controller: back up battery due for replacement in 30 months Secondary controller:  back up battery due for replacement in 29 months    I reviewed the LVAD parameters from today and compared the results to the patient's prior recorded data. LVAD interrogation was NEGATIVE for significant power changes, NEGATIVE for clinical alarms and STABLE for PI events/speed drops. No programming changes were made and pump is functioning within specified parameters. Pt is performing daily controller and system monitor self tests along with completing weekly and monthly maintenance for LVAD equipment.   LVAD equipment check completed and is in good working order. Back-up equipment not present. Charged back up battery and performed self-test on equipment.    Annual Equipment Maintenance on UBC/PM was performed on 12/22.    Past Medical History:  Diagnosis Date   AICD (automatic cardioverter/defibrillator) present 08/31/2019   Ankylosing spondylitis ([place])    Arthritis    BENIGN PROSTATIC HYPERTROPHY 06/07/2008   CHF (congestive heart failure) ([place])    COLONIC POLYPS, HX OF 06/07/2008   Depression    H/O hiatal hernia    Heart failure ([place])    HYPERLIPIDEMIA 06/07/2008   HYPERTENSION 06/07/2008   Myocardial infarction [facility]) 2005   NSTEMI, s/p LAD stent   NEPHROLITHIASIS, HX OF 06/07/2008   STEMI (ST elevation myocardial infarction) ([place]) 04/27/2019    Current Outpatient Medications  Medication Sig Dispense Refill   acetaminophen (TYLENOL) 500 MG tablet Take 1,000 mg by mouth every 6 (six) hours as needed (pain.).     atorvastatin (LIPITOR)  80 MG tablet TAKE 1 TABLET BY MOUTH ONCE DAILY AT 6PM. (Patient taking differently: Take 80 mg by mouth every evening.) 90 tablet 3   Carboxymethylcellulose Sodium (ARTIFICIAL TEARS OP) Place 1 drop into both eyes 3 (three) times daily as needed (for dryness or irritation).      eplerenone (INSPRA) 50 MG tablet Take 0.5 tablets (25 mg total) by mouth in the morning. 30 tablet 6   ezetimibe (ZETIA) 10 MG tablet TAKE ONE TABLET BY MOUTH DAILY (Patient taking differently: Take 10 mg by mouth daily.) 90 tablet 3   gabapentin (NEURONTIN) 300 MG capsule Take 1 capsule (300 mg total) by mouth 3 (three) times daily. 90 capsule 5   losartan (COZAAR) 25 MG tablet Take 1/2 tablet (12.5 mg total) by mouth daily. 30 tablet 5   melatonin 5 MG TABS Take 1 tablet (5 mg total) by mouth at bedtime. 30 tablet 6   nystatin ointment (MYCOSTATIN) Apply 1 application topically 2 (two) times daily. 30 g 3   ondansetron (ZOFRAN) 4 MG tablet Take 1 tablet (4 mg total) by mouth every 8 (eight) hours as needed for nausea or vomiting. 20 tablet 3   pantoprazole (PROTONIX) 40 MG tablet Take 1 tablet (40 mg total) by mouth daily. 30 tablet 6   sertraline (ZOLOFT) 100 MG tablet Take 1 tablet (100 mg total) by mouth daily. 30 tablet 2   tamsulosin (FLOMAX) 0.4 MG CAPS capsule TAKE 1 CAPSULE(0.4 MG) BY MOUTH DAILY (Patient taking differently: Take 0.4 mg by mouth daily.) 90 capsule 1   warfarin (COUMADIN) 2.5 MG tablet Take 2.5 mg (1 tablet) every Monday/Friday and 5 mg (2 tablets) all other days or as instructed by LVAD clinic. 75 tablet 11   torsemide (DEMADEX) 20 MG tablet Take 1 tablet (20 mg total) by mouth as needed. (Patient not taking: Reported on 06/28/2021) 30 tablet 5   traMADol (ULTRAM) 50 MG tablet Take 1 tablet (50 mg total) by mouth every 6 (six) hours as needed for moderate pain. 30 tablet 0   No current facility-administered medications for this encounter.    Patient has no active allergies.    Vitals:   06/28/21 1031  BP: (!) 100/0  Pulse: 100  SpO2: 98%  Weight: 82.8 kg (182 lb 9.6 oz)     Vital Signs:                 Doppler Pressure: 96 Automatc BP: (100)                 HR:  100 ST                                          SPO2: 98% on RA   Weight: 182.6 lb w/ eqt Last  weight: 174.4 lb    Physical Exam: General:  NAD.  HEENT: normal  Neck: supple. JVP not elevated.  Carotids 2+ bilat; no bruits. No lymphadenopathy or thryomegaly appreciated. Cor: LVAD hum.  Lungs: Clear. Abdomen: soft, nontender, non-distended. No hepatosplenomegaly. No bruits or masses. Good bowel sounds. Driveline site clean. Anchor in place.  Extremities: no cyanosis, clubbing, rash. Warm no edema  Neuro: alert & oriented x 3. No focal deficits. Moves all 4 without problem    ASSESSMENT AND PLAN:  1. Chronic systolic HFr EF due to iCM - Echo 05/19/20 EF 20-25% RV mildly HK. moderate AS  Mean gradient 13 AVA 1.2 cm2 DI 0.30 - s/p HM-III VAD + bioprosthetic AVR 04/06/21 - Doing very well NYHA II - Orthostasis improved. Continue lasix prn only. - Continue Losartan 12.5 mg daily - Continue eplerenone 12.5 mg daily - Awaiting enrollment into CR   2. HM-3 LVAD implant - VAD interrogated personally. Parameters stable. - ASA stopped 12/26 - INR 1.7 Goal 2.0-3.0 Discussed dosing with PharmD personally. - LDH 126 - MAPs OK   3. Severe low-flow aortic stenosis s/p AVR - s/p VAD/bioprosthetic AVR on 11/25   4. CAD  - s/p anterior STEMI (12/20). LHC showed chronically occluded RCA (with L>>R collaterals) and thrombotic occlusion of proximal LAD. Underwent PCI of LAD. - Now s/p VAD - No s/s angina - Continue atorvastatin 80.  - off ASA.   5. Severe protein-calorie malnutrition - improving   6. Paroxysmal Atrial Fibrillation w/ RVR - Remains in NSR  - Off amio - on warfarin.    7. 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  - Bcx recollected and pending  - Tissue sent to [facility] in [place] for broad 16 S ribosomal sequencing for bacterial pathogens as well as T wet Bhilai Bartonella Brucella  - Ceftriaxone 2 g IV daily x 6 weeks. End Date: May 22, 2021. PICC is out - ICD  removed  Total time spent 35 minutes. Over half that time spent discussing above.    Glori Bickers, MD  1:41 PM

## 2021-07-02 ENCOUNTER — Ambulatory Visit (INDEPENDENT_AMBULATORY_CARE_PROVIDER_SITE_OTHER): Payer: Medicare Other

## 2021-07-02 DIAGNOSIS — M456 Ankylosing spondylitis lumbar region: Secondary | ICD-10-CM

## 2021-07-02 DIAGNOSIS — I10 Essential (primary) hypertension: Secondary | ICD-10-CM

## 2021-07-02 DIAGNOSIS — E785 Hyperlipidemia, unspecified: Secondary | ICD-10-CM

## 2021-07-02 DIAGNOSIS — Z95811 Presence of heart assist device: Secondary | ICD-10-CM

## 2021-07-02 DIAGNOSIS — I2510 Atherosclerotic heart disease of native coronary artery without angina pectoris: Secondary | ICD-10-CM

## 2021-07-02 DIAGNOSIS — I5022 Chronic systolic (congestive) heart failure: Secondary | ICD-10-CM

## 2021-07-02 NOTE — Patient Instructions (Signed)
Visit Information  Thank you for taking time to visit with me today. Please don't hesitate to contact me if I can be of assistance to you before our next scheduled telephone appointment.  Following are the goals we discussed today:  Take all medications as prescribed Attend all scheduled provider appointments Call pharmacy for medication refills 3-7 days in advance of running out of medications Perform all self care activities independently  Call provider office for new concerns or questions  call office if I gain more than 2 pounds in one day or 5 pounds in one week keep legs up while sitting watch for swelling in feet, ankles and legs every day weigh myself daily follow rescue plan if symptoms flare-up eat more whole grains, fruits and vegetables, lean meats and healthy fats keep all doctor appointments call for medicine refill 2 or 3 days before it runs out take all medications exactly as prescribed call doctor with any symptoms you believe are related to your medicine  Our next appointment is by telephone on 10/01/21 at 10 AM  Please call the care guide team at [phone] if you need to cancel or reschedule your appointment.   If you are experiencing a Mental Health or [place] or need someone to talk to, please call the Suicide and Crisis Lifeline: 988 call the [place] National Suicide Prevention Lifeline: 940-839-9830 or TTY: (231)836-8634 TTY 579-500-0908) to talk to a trained counselor call 1-800-273-TALK (toll free, 24 hour hotline) go to [facility] [address], [place] [phone]) call 911   Patient verbalizes understanding of instructions and care plan provided today and agrees to view in [facility]. Active MyChart status confirmed with patient.   Ketih Garter RN, Jackquline Denmark, CDE Care Management Coordinator [name] [phone]

## 2021-07-02 NOTE — Chronic Care Management (AMB) (Signed)
Chronic Care Management   CCM RN Visit Note  07/02/2021 Name: Brandon [name] MRN: [id number] DOB: 1955-06-24  Subjective: Brandon [name] is a 66 y.o. year old male who is a primary care patient of [name], MD. The care management team was consulted for assistance with disease management and care coordination needs.    Engaged with patient by telephone for follow up visit in response to provider referral for case management and/or care coordination services.   Consent to Services:  The patient was given information about Chronic Care Management services, agreed to services, and gave verbal consent prior to initiation of services.  Please see initial visit note for detailed documentation.   Patient agreed to services and verbal consent obtained.   Assessment: Review of patient past medical history, allergies, medications, health status, including review of consultants reports, laboratory and other test data, was performed as part of comprehensive evaluation and provision of chronic care management services.   SDOH (Social Determinants of Health) assessments and interventions performed:    CCM Care Plan  No Active Allergies  Outpatient Encounter Medications as of 07/02/2021  Medication Sig Note   acetaminophen (TYLENOL) 500 MG tablet Take 1,000 mg by mouth every 6 (six) hours as needed (pain.).    atorvastatin (LIPITOR) 80 MG tablet TAKE 1 TABLET BY MOUTH ONCE DAILY AT 6PM. (Patient taking differently: Take 80 mg by mouth every evening.)    Carboxymethylcellulose Sodium (ARTIFICIAL TEARS OP) Place 1 drop into both eyes 3 (three) times daily as needed (for dryness or irritation).    eplerenone (INSPRA) 50 MG tablet Take 0.5 tablets (25 mg total) by mouth in the morning.    ezetimibe (ZETIA) 10 MG tablet TAKE ONE TABLET BY MOUTH DAILY (Patient taking differently: Take 10 mg by mouth daily.)    gabapentin (NEURONTIN) 300 MG capsule Take 1 capsule (300 mg total) by mouth 3 (three)  times daily.    losartan (COZAAR) 25 MG tablet Take 1/2 tablet (12.5 mg total) by mouth daily.    melatonin 5 MG TABS Take 1 tablet (5 mg total) by mouth at bedtime.    nystatin ointment (MYCOSTATIN) Apply 1 application topically 2 (two) times daily.    ondansetron (ZOFRAN) 4 MG tablet Take 1 tablet (4 mg total) by mouth every 8 (eight) hours as needed for nausea or vomiting.    pantoprazole (PROTONIX) 40 MG tablet Take 1 tablet (40 mg total) by mouth daily.    sertraline (ZOLOFT) 100 MG tablet Take 1 tablet (100 mg total) by mouth daily.    tamsulosin (FLOMAX) 0.4 MG CAPS capsule TAKE 1 CAPSULE(0.4 MG) BY MOUTH DAILY (Patient taking differently: Take 0.4 mg by mouth daily.)    torsemide (DEMADEX) 20 MG tablet Take 1 tablet (20 mg total) by mouth as needed. (Patient not taking: Reported on 06/28/2021)    traMADol (ULTRAM) 50 MG tablet Take 1 tablet (50 mg total) by mouth every 6 (six) hours as needed for moderate pain.    warfarin (COUMADIN) 2.5 MG tablet Take 2.5 mg (1 tablet) every Monday/Friday and 5 mg (2 tablets) all other days or as instructed by LVAD clinic. 06/28/2021: 2.5 (1 tab) every Monday and 5 mg (2 tablets) all other days.   No facility-administered encounter medications on file as of 07/02/2021.    Patient Active Problem List   Diagnosis Date Noted   Long term (current) use of anticoagulants 05/15/2021   LVAD (left ventricular assist device) present ([place]) 04/06/2021   Protein-calorie  malnutrition, severe 03/14/2021   ILD (interstitial lung disease) (HCC)    Ankylosing spondylitis (HCC)    Chronic systolic CHF (congestive heart failure) ([place]) 03/07/2021   Acute on chronic systolic (congestive) heart failure ([place]) 01/08/2021   Dental caries    Malnutrition of moderate degree 12/01/2020   Pressure injury of skin 12/01/2020   Cholelithiases    Aortic valve endocarditis    Elevated brain natriuretic peptide (BNP) level 11/30/2020   Elevated troponin 11/30/2020   Hypokalemia  11/30/2020   Hypomagnesemia 11/30/2020   Normocytic anemia 11/30/2020   Dyspnea    Infected defibrillator ([place])    Bacteremia    Central line infection    Acute respiratory failure with hypoxia and hypercapnia ([place]) 11/29/2020   Centrilobular emphysema ([place]) 08/09/2020   Benign neoplasm of colon    Heart failure ([place]) 12/31/2019   Ischemic cardiomyopathy 08/31/2019   STEMI (ST elevation myocardial infarction) ([place]) 04/27/2019   Acute anterior wall MI ([place]) 04/27/2019   Mild aortic stenosis 12/02/2012   Ankylosis of sacroiliac joint 03/08/2011   TOBACCO ABUSE 03/13/2009   Hyperlipidemia with target LDL less than 70 06/07/2008   Essential hypertension 06/07/2008   CAD (coronary artery disease) 06/07/2008   BENIGN PROSTATIC HYPERTROPHY 06/07/2008   History of colon polyps 06/07/2008   NEPHROLITHIASIS, HX OF 06/07/2008    Conditions to be addressed/monitored:CHF, CAD, HTN, HLD, and LVAD, Ankylosing Spondylitis  Care Plan : RN Care Manager Plan of Care  Updates made by Dimitri Ped, RN since 07/02/2021 12:00 AM     Problem: Chronic Disease Management and Care Coordination Needs (LVAD, CAD, CHF,HTN,  HLD, ankylosing spondylitis)   Priority: High     Long-Range Goal: Establish Plan of Care for Chronic Disease Management Needs (LVAD, CAD, CHF,HTN,  HLD, ankylosing spondylitis)   Start Date: 05/29/2021  Expected End Date: 11/25/2021  Priority: High  Note:   Current Barriers:  Knowledge Deficits related to plan of care for management of CHF, CAD, HTN, HLD, and LVAD, ankylosing spondylitis  Chronic Disease Management support and education needs related to CHF, CAD, HTN, HLD, and LVAD, ankylosing spondylitis  States that he has been feeling good and continues to get stronger.  States he is on a waiting list to start cardiac rehab. States he States his LVAD site is healing with no signs of infection. Denies any chest pain, swelling or shortness of breath. States he is weighing daily  and he weighed 185.5 today.  States he trying to eat healthy and has been gaining  weight.  States he has been having some back and shoulder pain and states he wants to get back on the Remicade when his rheumatologist approves him to restart.     RNCM Clinical Goal(s):  Patient will verbalize understanding of plan for management of CHF, CAD, HTN, HLD, and LVAD, ankylosing spondylitis as evidenced by voiced adherence to plan of care verbalize basic understanding of  CHF, CAD, HTN, HLD, and LVAD, ankylosing spondylitis disease process and self health management plan as evidenced by voiced understanding and teach back take all medications exactly as prescribed and will call provider for medication related questions as evidenced by dispense report and pt verbalization attend all scheduled medical appointments: Annual Wellness Visit 07/11/21, VAD clinic 08/09/21 as evidenced by medical records demonstrate Improved adherence to prescribed treatment plan for CHF, CAD, HTN, HLD, and LVAD, ankylosing spondylitis as evidenced by readings within limits, voiced adherence to plan of care continue to work with RN Care Manager to address  care management and care coordination needs related to  CHF, CAD, HTN, HLD, and LVAD, ankylosing spondylitis as evidenced by adherence to CM Team Scheduled appointments through collaboration with RN Care manager, provider, and care team.   Interventions: 1:1 collaboration with primary care provider regarding development and update of comprehensive plan of care as evidenced by provider attestation and co-signature Inter-disciplinary care team collaboration (see longitudinal plan of care) Evaluation of current treatment plan related to  self management and patient's adherence to plan as established by provider  LVAD  (Status:  Goal on track:  Yes.)  Long Term Goal Evaluation of current treatment plan related to  LVAD  ,  LVAD  self-management and patient's adherence to plan as  established by provider. Discussed plans with patient for ongoing care management follow up and provided patient with direct contact information for care management team Evaluation of current treatment plan related to LVAD and patient's adherence to plan as established by provider Provided education to patient re: LVAD Reviewed benefits of cardiac rehab and to try to be as active as tolerated. Reviewed to follow a low sodium well balanced diet with more calories to aid with weight gain  Pain /ankylosing spondylitisInterventions:  (Status:  Goal on track:  Yes.) Long Term Goal Pain assessment performed Medications reviewed Reviewed provider established plan for pain management Discussed importance of adherence to all scheduled medical appointments Counseled on the importance of reporting any/all new or changed pain symptoms or management strategies to pain management provider Advised patient to report to care team affect of pain on daily activities Discussed use of relaxation techniques and/or diversional activities to assist with pain reduction (distraction, imagery, relaxation, massage, acupressure, TENS, heat, and cold application Reviewed to follow up with his rheumatology to discuss resuming his Remicade    CAD Interventions: (Status:  Goal on track:  Yes.) Long Term Goal Assessed understanding of CAD diagnosis Medications reviewed including medications utilized in CAD treatment plan Counseled on importance of regular laboratory monitoring as prescribed Reviewed Importance of taking all medications as prescribed Reviewed Importance of attending all scheduled provider appointments Advised to report any changes in symptoms or exercise tolerance   Heart Failure Interventions:  (Status:  Goal on track:  Yes.) Long Term Goal Provided education on low sodium diet Reviewed Heart Failure Action Plan in depth and provided written copy Discussed importance of daily weight and advised patient to  weigh and record daily Provided patient with education about the role of exercise in the management of heart failure   Hyperlipidemia Interventions:  (Status:  Goal on track:  Yes.) Long Term Goal Medication review performed; medication list updated in electronic medical record.  Provider established cholesterol goals reviewed Counseled on importance of regular laboratory monitoring as prescribed Reviewed role and benefits of statin for ASCVD risk reduction Reviewed importance of limiting foods high in cholesterol  Hypertension Interventions:  (Status:  Goal on track:  Yes.) Long Term Goal Last practice recorded BP readings:  BP Readings from Last 3 Encounters:  05/24/21 (!) 100/0  05/24/21 110/82  05/18/21 (!) 94/53  Most recent eGFR/CrCl: No results found for: EGFR  No components found for: CRCL  Evaluation of current treatment plan related to hypertension self management and patient's adherence to plan as established by provider Provided education to patient re: stroke prevention, s/s of heart attack and stroke Reviewed medications with patient and discussed importance of compliance Screening for signs and symptoms of depression related to chronic disease state   Surgery (LVAD  insertion):  (Status: Goal on track:  Yes.) Short Term Goal Evaluation of current treatment plan related to LVAD surgery assessed patient/caregiver understanding of surgical procedure   reviewed post-operative instructions with patient/caregiver addressed questions about post - surgical incision care  Reinforced s/sx of infection of insertion site to call provider. Reinforced to try to eat a well balanced low sodium diet with protein to help with healing  Patient Goals/Self-Care Activities: Take all medications as prescribed Attend all scheduled provider appointments Call pharmacy for medication refills 3-7 days in advance of running out of medications Perform all self care activities independently  Call  provider office for new concerns or questions  call office if I gain more than 2 pounds in one day or 5 pounds in one week keep legs up while sitting watch for swelling in feet, ankles and legs every day weigh myself daily follow rescue plan if symptoms flare-up eat more whole grains, fruits and vegetables, lean meats and healthy fats keep all doctor appointments call for medicine refill 2 or 3 days before it runs out take all medications exactly as prescribed call doctor with any symptoms you believe are related to your medicine  Follow Up Plan:  Telephone follow up appointment with care management team member scheduled for:  10/01/21 The patient has been provided with contact information for the care management team and has been advised to call with any health related questions or concerns.       Plan:Telephone follow up appointment with care management team member scheduled for:  10/01/21 The patient has been provided with contact information for the care management team and has been advised to call with any health related questions or concerns.  Alanzo Garter RN, Jackquline Denmark, CDE Care Management Coordinator [facility] [phone]

## 2021-07-05 ENCOUNTER — Ambulatory Visit (HOSPITAL_COMMUNITY): Payer: Self-pay | Admitting: Pharmacist

## 2021-07-05 LAB — POCT INR: INR: 1.7 — AB (ref 2.0–3.0)

## 2021-07-05 NOTE — Progress Notes (Signed)
LVAD INR 

## 2021-07-10 DIAGNOSIS — I119 Hypertensive heart disease without heart failure: Secondary | ICD-10-CM | POA: Diagnosis not present

## 2021-07-10 DIAGNOSIS — I509 Heart failure, unspecified: Secondary | ICD-10-CM

## 2021-07-10 DIAGNOSIS — I2510 Atherosclerotic heart disease of native coronary artery without angina pectoris: Secondary | ICD-10-CM

## 2021-07-10 DIAGNOSIS — M459 Ankylosing spondylitis of unspecified sites in spine: Secondary | ICD-10-CM | POA: Diagnosis not present

## 2021-07-10 DIAGNOSIS — E785 Hyperlipidemia, unspecified: Secondary | ICD-10-CM

## 2021-07-10 NOTE — Telephone Encounter (Signed)
Called patient to see if he was interested in participating in the Cardiac Rehab Program. Patient stated yes. Patient will come in for orientation on 07/26/21 @ 9AM and will attend the 3PM exercise class.   Tourist information centre manager.

## 2021-07-11 ENCOUNTER — Ambulatory Visit: Payer: Medicare Other

## 2021-07-12 ENCOUNTER — Ambulatory Visit (HOSPITAL_COMMUNITY): Payer: Self-pay | Admitting: Pharmacist

## 2021-07-12 LAB — POCT INR: INR: 2.5 (ref 2.0–3.0)

## 2021-07-12 NOTE — Progress Notes (Signed)
LVAD INR 

## 2021-07-19 ENCOUNTER — Ambulatory Visit (HOSPITAL_COMMUNITY): Payer: Self-pay | Admitting: Pharmacist

## 2021-07-19 ENCOUNTER — Ambulatory Visit (INDEPENDENT_AMBULATORY_CARE_PROVIDER_SITE_OTHER): Payer: Medicare Other

## 2021-07-19 VITALS — BP 122/62 | HR 96 | Temp 98.6°F | Ht 72.0 in | Wt 186.8 lb

## 2021-07-19 DIAGNOSIS — Z0000 Encounter for general adult medical examination without abnormal findings: Secondary | ICD-10-CM

## 2021-07-19 LAB — POCT INR: INR: 2.4 (ref 2.0–3.0)

## 2021-07-19 NOTE — Progress Notes (Signed)
LVAD INR 

## 2021-07-19 NOTE — Progress Notes (Signed)
Subjective:   Brandon [name] is a 66 y.o. male who presents for Medicare Annual/Subsequent preventive examination.  Review of Systems         Objective:    Today's Vitals   07/19/21 1319  BP: 122/62  Pulse: 96  Temp: 98.6 F (37 C)  TempSrc: Oral  Weight: 186 lb 12.8 oz (84.7 kg)  Height: 6' (1.829 m)   Body mass index is 25.33 kg/m.  Advanced Directives 07/19/2021 05/29/2021 03/22/2021 01/08/2021 11/29/2020 04/24/2020 04/19/2020  Does Patient Have a Medical Advance Directive? Yes Yes No No Yes Yes Yes  Type of Paramedic of [place];Living will [place];Living will - - [place];Living will - [place];Living will  Does patient want to make changes to medical advance directive? No - Patient declined - - - No - Patient declined - No - Patient declined  Copy of [place] in Chart? No - copy requested Yes - validated most recent copy scanned in chart (See row information) - - No - copy requested No - copy requested No - copy requested  Would patient like information on creating a medical advance directive? - - No - Patient declined - - - -  Pre-existing out of facility DNR order (yellow form or pink MOST form) - - - - - - -    Current Medications (verified) Outpatient Encounter Medications as of 07/19/2021  Medication Sig   acetaminophen (TYLENOL) 500 MG tablet Take 1,000 mg by mouth every 6 (six) hours as needed (pain.).   atorvastatin (LIPITOR) 80 MG tablet TAKE 1 TABLET BY MOUTH ONCE DAILY AT 6PM. (Patient taking differently: Take 80 mg by mouth every evening.)   Carboxymethylcellulose Sodium (ARTIFICIAL TEARS OP) Place 1 drop into both eyes 3 (three) times daily as needed (for dryness or irritation).   eplerenone (INSPRA) 50 MG tablet Take 0.5 tablets (25 mg total) by mouth in the morning.   ezetimibe (ZETIA) 10 MG tablet TAKE ONE TABLET BY MOUTH DAILY (Patient taking differently:  Take 10 mg by mouth daily.)   gabapentin (NEURONTIN) 300 MG capsule Take 1 capsule (300 mg total) by mouth 3 (three) times daily.   losartan (COZAAR) 25 MG tablet Take 1/2 tablet (12.5 mg total) by mouth daily.   melatonin 5 MG TABS Take 1 tablet (5 mg total) by mouth at bedtime.   nystatin ointment (MYCOSTATIN) Apply 1 application topically 2 (two) times daily.   ondansetron (ZOFRAN) 4 MG tablet Take 1 tablet (4 mg total) by mouth every 8 (eight) hours as needed for nausea or vomiting.   pantoprazole (PROTONIX) 40 MG tablet Take 1 tablet (40 mg total) by mouth daily.   sertraline (ZOLOFT) 100 MG tablet Take 1 tablet (100 mg total) by mouth daily.   tamsulosin (FLOMAX) 0.4 MG CAPS capsule TAKE 1 CAPSULE(0.4 MG) BY MOUTH DAILY (Patient taking differently: Take 0.4 mg by mouth daily.)   torsemide (DEMADEX) 20 MG tablet Take 1 tablet (20 mg total) by mouth as needed. (Patient not taking: Reported on 06/28/2021)   traMADol (ULTRAM) 50 MG tablet Take 1 tablet (50 mg total) by mouth every 6 (six) hours as needed for moderate pain.   warfarin (COUMADIN) 2.5 MG tablet Take 2.5 mg (1 tablet) every Monday/Friday and 5 mg (2 tablets) all other days or as instructed by [facility] clinic.   No facility-administered encounter medications on file as of 07/19/2021.    Allergies (verified) Patient has no  active allergies.   History: Past Medical History:  Diagnosis Date   AICD (automatic cardioverter/defibrillator) present 08/31/2019   Ankylosing spondylitis ([place])    Arthritis    BENIGN PROSTATIC HYPERTROPHY 06/07/2008   CHF (congestive heart failure) ([place])    COLONIC POLYPS, HX OF 06/07/2008   Depression    H/O hiatal hernia    Heart failure ([place])    HYPERLIPIDEMIA 06/07/2008   HYPERTENSION 06/07/2008   Myocardial infarction [facility]) 2005   NSTEMI, s/p LAD stent   NEPHROLITHIASIS, HX OF 06/07/2008   STEMI (ST elevation myocardial infarction) ([place]) 04/27/2019   Past Surgical History:  Procedure Laterality Date    AORTIC VALVE REPLACEMENT N/A 04/06/2021   Procedure: AORTIC VALVE REPLACEMENT (AVR) WITH INSPIRIS RESILIA  AORTIC VALVE SIZE 25MM;  Surgeon: Gaye Pollack, MD;  Location: [place];  Service: Open Heart Surgery;  Laterality: N/A;   COLONOSCOPY WITH PROPOFOL N/A 04/24/2020   Procedure: COLONOSCOPY WITH PROPOFOL;  Surgeon: [name], MD;  Location: [facility];  Service: Gastroenterology;  Laterality: N/A;   CORONARY ANGIOPLASTY WITH STENT PLACEMENT  2005   LAD stent, jailed diagonal   CORONARY/GRAFT ACUTE MI REVASCULARIZATION N/A 04/27/2019   Procedure: Coronary/Graft Acute MI Revascularization;  Surgeon: [name], MD;  Location: [place] CV LAB;  Service: Cardiovascular;  Laterality: N/A;   ICD IMPLANT  08/31/2019   ICD IMPLANT N/A 08/31/2019   Procedure: ICD IMPLANT;  Surgeon: [name], MD;  Location: [place] CV LAB;  Service: Cardiovascular;  Laterality: N/A;   ICD LEAD REMOVAL N/A 12/04/2020   Procedure: ICD SYSTEM EXTRACTION;  Surgeon: Vickie Epley, MD;  Location: [place];  Service: Cardiovascular;  Laterality: N/A;   INSERTION OF IMPLANTABLE LEFT VENTRICULAR ASSIST DEVICE N/A 04/06/2021   Procedure: INSERTION OF IMPLANTABLE LEFT VENTRICULAR ASSIST DEVICE;  Surgeon: Gaye Pollack, MD;  Location: [place];  Service: Open Heart Surgery;  Laterality: N/A;  HM3   IR THORACENTESIS ASP PLEURAL SPACE W/IMG GUIDE  04/19/2021   IR THORACENTESIS ASP PLEURAL SPACE W/IMG GUIDE  04/20/2021   IR THORACENTESIS ASP PLEURAL SPACE W/IMG GUIDE  04/23/2021   LAMINECTOMY     lumbar   LEFT HEART CATH AND CORONARY ANGIOGRAPHY N/A 04/27/2019   Procedure: LEFT HEART CATH AND CORONARY ANGIOGRAPHY;  Surgeon: [name], MD;  Location: [place] CV LAB;  Service: Cardiovascular;  Laterality: N/A;   LUMBAR FUSION  01/2012   POLYPECTOMY  04/24/2020   Procedure: POLYPECTOMY;  Surgeon: [name], MD;  [facility];  Service: Gastroenterology;;   RIGHT  HEART CATH N/A 04/27/2019   Procedure: RIGHT HEART CATH;  Surgeon: [name], Elige M, MD;  Location: [place] CV [facility];  Service: Cardiovascular;  Laterality: N/A;   RIGHT HEART CATH N/A 03/28/2021   Procedure: RIGHT HEART CATH;  Surgeon: Jolaine Artist, MD;  Location: [place] CV [facility];  Service: Cardiovascular;  Laterality: N/A;   RIGHT/LEFT HEART CATH AND CORONARY ANGIOGRAPHY N/A 11/29/2020   Procedure: RIGHT/LEFT HEART CATH AND CORONARY ANGIOGRAPHY;  Surgeon: Jolaine Artist, MD;  Location: [place] CV LAB;  Service: Cardiovascular;  Laterality: N/A;   TEE WITHOUT CARDIOVERSION N/A 12/04/2020   Procedure: TRANSESOPHAGEAL ECHOCARDIOGRAM (TEE);  Surgeon: Vickie Epley, MD;  Location: [place];  Service: Cardiovascular;  Laterality: N/A;   TEE WITHOUT CARDIOVERSION N/A 04/06/2021   Procedure: TRANSESOPHAGEAL ECHOCARDIOGRAM (TEE);  Surgeon: Gaye Pollack, MD;  Location: [place];  Service: Open Heart Surgery;  Laterality: N/A;   TONSILLECTOMY  warthin tumor removal Left 2014   Family History  Problem Relation Age of Onset   Depression Father    Arthritis Sister        RA   Heart failure Mother    Other Neg Hx    Colon cancer Neg Hx    Esophageal cancer Neg Hx    Rectal cancer Neg Hx    Stomach cancer Neg Hx    Colon polyps Neg Hx    Social History   Socioeconomic History   Marital status: Single    Spouse name: Not on file   Number of children: Not on file   Years of education: Not on file   Highest education level: Not on file  Occupational History   Occupation: Best boy: [employer]  Tobacco Use   Smoking status: Former    Packs/day: 1.00    Years: 40.00    Pack years: 40.00    Types: Cigarettes    Quit date: 07/26/2019    Years since quitting: 1.9   Smokeless tobacco: Never   Tobacco comments:    1-1.5 pks per year x 40-45 yrs  Vaping Use   Vaping Use: Never used  Substance and Sexual Activity   Alcohol use: Yes    Alcohol/week: 7.0  standard drinks    Types: 7 Shots of liquor per week    Comment: "2 vodka tonics a night"   Drug use: Yes    Comment: occasionally - CBD oil   Sexual activity: Not on file  Other Topics Concern   Not on file  Social History Narrative   Lives in [place] alone   Works part time for a [employer] in [place] Strain: Low Risk    Difficulty of Paying Living Expenses: Not hard at all  Food Insecurity: No Food Insecurity   Worried About Charity fundraiser in the Last Year: Never true   Arboriculturist in the Last Year: Never true  Transportation Needs: No Transportation Needs   Lack of Transportation (Medical): No   Lack of Transportation (Non-Medical): No  Physical Activity: Insufficiently Active   Days of Exercise per Week: 5 days   Minutes of Exercise per Session: 20 min  Stress: No Stress Concern Present   Feeling of Stress : Not at all  Social Connections: Socially Isolated   Frequency of Communication with Friends and Family: More than three times a week   Frequency of Social Gatherings with Friends and Family: More than three times a week   Attends Religious Services: Never   Marine scientist or Organizations: No   Attends Music therapist: Never   Marital Status: Never married    Tobacco Counseling Counseling given: Not Answered Tobacco comments: 1-1.5 pks per year x 40-45 yrs   Clinical Intake:  Pre-visit preparation completed: No  Diabetic?  No  Activities of Daily Living In your present state of health, do you have any difficulty performing the following activities: 07/19/2021 03/22/2021  Hearing? N N  Vision? N N  Difficulty concentrating or making decisions? N N  Walking or climbing stairs? N Y  Dressing or bathing? N N  Doing errands, shopping? N N  Preparing Food and eating ? N -  Using the Toilet? N -  In the past six months, have you accidently leaked urine? N  -  Do you have problems with loss of bowel  control? N -  Managing your Medications? N -  Managing your Finances? N -  Housekeeping or managing your Housekeeping? N -  Some recent data might be hidden    Patient Care Team: [name], MD as PCP - General (Family Medicine) Stanford Breed Denice Bors, MD as PCP - Cardiology (Cardiology) [name], MD as PCP - Electrophysiology (Cardiology) Bensimhon, Shaune Pascal, MD as PCP - Advanced Heart Failure (Cardiology) Gavin Pound, MD as Consulting Physician (Rheumatology) Mercie Eon, MD as Referring Physician (Rheumatology) Lbcardiology, Rounding, MD as Rounding Team (Cardiology) Dimitri Ped, RN as Case Manager  Indicate any recent Medical Services you may have received from other than [facility] providers in the past year (date may be approximate).     Assessment:   This is a routine wellness examination for [place].  Hearing/Vision screen Hearing Screening - Comments:: No difficulty hearing Vision Screening - Comments:: Wears reading glasses. Followed by Dr Herbert Deaner  Dietary issues and exercise activities discussed: Exercise limited by: None identified   Goals Addressed               This Visit's Progress     Patient Stated (pt-stated)        I will continue to walk 5 days a week for 40 minutes .       Depression Screen PHQ 2/9 Scores 07/19/2021 05/29/2021 05/24/2021 05/24/2021 02/16/2021 12/27/2020 04/19/2020  PHQ - 2 Score 0 2 0 0 3 0 0  PHQ- 9 Score 0 3 - - - - 0    Fall Risk Fall Risk  07/19/2021 07/15/2021 07/07/2021 07/07/2021 05/29/2021  Falls in the past year? 0 0 0 0 0  Number falls in past yr: 0 - - - 0  Injury with Fall? 0 - - - 0  Risk for fall due to : No Fall Risks - - - No Fall Risks  Risk for fall due to: Comment - - - - -  Follow up - - - - Education provided;Falls prevention discussed    FALL RISK PREVENTION PERTAINING TO THE HOME:  Any stairs in or around the home? No  If so, are there any without handrails?  No  Home free of loose throw rugs in walkways, pet beds, electrical cords, etc? Yes  Adequate lighting in your home to reduce risk of falls? Yes   ASSISTIVE DEVICES UTILIZED TO PREVENT FALLS:  Life alert? No  Use of a cane, walker or w/c? No  Grab bars in the bathroom? No  Shower chair or bench in shower? No  Elevated toilet seat or a handicapped toilet? No   TIMED UP AND GO:  Was the test performed? Yes .  Length of time to ambulate 10 feet: 5  sec.   Gait steady and fast without use of assistive device  Cognitive Function:   6CIT Screen 07/19/2021  What Year? 0 points  What month? 0 points  What time? 0 points  Count back from 20 0 points  Months in reverse 0 points  Repeat phrase 0 points  Total Score 0    Immunizations Immunization History  Administered Date(s) Administered   Influenza Split 02/01/2012   Influenza Whole 02/25/2011   Influenza,inj,Quad PF,6+ Mos 03/29/2013, 03/04/2017, 04/13/2018   Influenza-Unspecified 02/01/2014, 01/14/2015, 02/20/2020   PFIZER(Purple Top)SARS-COV-2 Vaccination 06/18/2019, 07/13/2019, 02/22/2020   Pneumococcal Polysaccharide-23 04/19/2020   Tdap 09/12/2011   Zoster Recombinat (Shingrix) 08/13/2016, 10/26/2016   Zoster, Live 12/04/2013    TDAP status: Up to date  Flu  Vaccine status: Declined, Education has been provided regarding the importance of this vaccine but patient still declined. Advised may receive this vaccine at local pharmacy or Health Dept. Aware to provide a copy of the vaccination record if obtained from local pharmacy or Health Dept. Verbalized acceptance and understanding.  Pneumococcal vaccine status: Declined,  Education has been provided regarding the importance of this vaccine but patient still declined. Advised may receive this vaccine at local pharmacy or Health Dept. Aware to provide a copy of the vaccination record if obtained from local pharmacy or Health Dept. Verbalized acceptance and understanding.    Covid-19 vaccine status: Completed vaccines  Qualifies for Shingles Vaccine? Yes   Zostavax completed Yes   Shingrix Completed?: Yes  Screening Tests Health Maintenance  Topic Date Due   COVID-19 Vaccine (4 - Booster for Pfizer series) 04/18/2020   Pneumonia Vaccine 58+ Years old (2 - PCV) 04/19/2021   INFLUENZA VACCINE  08/10/2021 (Originally 12/11/2020)   TETANUS/TDAP  09/11/2021   COLONOSCOPY (Pts 45-40yrs Insurance coverage will need to be confirmed)  04/25/2023   Hepatitis C Screening  Completed   Zoster Vaccines- Shingrix  Completed   HPV VACCINES  Aged Out    Health Maintenance  Health Maintenance Due  Topic Date Due   COVID-19 Vaccine (4 - Booster for Pfizer series) 04/18/2020   Pneumonia Vaccine 43+ Years old (2 - PCV) 04/19/2021    Colorectal cancer screening: Type of screening: Colonoscopy. Completed 04/24/20. Repeat every 3 years  Lung Cancer Screening: (Low Dose CT Chest recommended if Age 5-80 years, 30 pack-year currently smoking OR have quit w/in 15years.) does not qualify.    Additional Screening:  Hepatitis C Screening: does qualify; Completed 12/01/20  Vision Screening: Recommended annual ophthalmology exams for early detection of glaucoma and other disorders of the eye. Is the patient up to date with their annual eye exam?  Yes  Who is the provider or what is the name of the office in which the patient attends annual eye exams? Dr Herbert Deaner If pt is not established with a provider, would they like to be referred to a provider to establish care? No .   Dental Screening: Recommended annual dental exams for proper oral hygiene  Community Resource Referral / Chronic Care Management:  CRR required this visit?  No   CCM required this visit?  No      Plan:     I have personally reviewed and noted the following in the patients chart:   Medical and social history Use of alcohol, tobacco or illicit drugs  Current medications and supplements  including opioid prescriptions. Patient is not currently taking opioid prescriptions. Functional ability and status Nutritional status Physical activity Advanced directives List of other physicians Hospitalizations, surgeries, and ER visits in previous 12 months Vitals Screenings to include cognitive, depression, and falls Referrals and appointments  In addition, I have reviewed and discussed with patient certain preventive protocols, quality metrics, and best practice recommendations. A written personalized care plan for preventive services as well as general preventive health recommendations were provided to patient.     Criselda Peaches, LPN   10/18/5447   Nurse Notes: None

## 2021-07-19 NOTE — Patient Instructions (Addendum)
Brandon [name] , Thank you for taking time to come for your Medicare Wellness Visit. I appreciate your ongoing commitment to your health goals. Please review the following plan we discussed and let me know if I can assist you in the future.   These are the goals we discussed:  Goals       Patient Stated (pt-stated)      I will continue to walk 5 days a week for 40 minutes .        This is a list of the screening recommended for you and due dates:  Health Maintenance  Topic Date Due   COVID-19 Vaccine (4 - Booster for Pfizer series) 04/18/2020   Pneumonia Vaccine (2 - PCV) 04/19/2021   Flu Shot  08/10/2021*   Tetanus Vaccine  09/11/2021   Colon Cancer Screening  04/25/2023   Hepatitis C Screening: USPSTF Recommendation to screen - Ages 18-79 yo.  Completed   Zoster (Shingles) Vaccine  Completed   HPV Vaccine  Aged Out  *Topic was postponed. The date shown is not the original due date.   Opioid Pain Medicine Management Opioids are powerful medicines that are used to treat moderate to severe pain. When used for short periods of time, they can help you to: Sleep better. Do better in physical or occupational therapy. Feel better in the first few days after an injury. Recover from surgery. Opioids should be taken with the supervision of a trained health care provider. They should be taken for the shortest period of time possible. This is because opioids can be addictive, and the longer you take opioids, the greater your risk of addiction. This addiction can also be called opioid use disorder. What are the risks? Using opioid pain medicines for longer than 3 days increases your risk of side effects. Side effects include: Constipation. Nausea and vomiting. Breathing difficulties (respiratory depression). Drowsiness. Confusion. Opioid use disorder. Itching. Taking opioid pain medicine for a long period of time can affect your ability to do daily tasks. It also puts you at risk  for: Motor vehicle crashes. Depression. Suicide. Heart attack. Overdose, which can be life-threatening. What is a pain treatment plan? A pain treatment plan is an agreement between you and your health care provider. Pain is unique to each person, and treatments vary depending on your condition. To manage your pain, you and your health care provider need to work together. To help you do this: Discuss the goals of your treatment, including how much pain you might expect to have and how you will manage the pain. Review the risks and benefits of taking opioid medicines. Remember that a good treatment plan uses more than one approach and minimizes the chance of side effects. Be honest about the amount of medicines you take and about any drug or alcohol use. Get pain medicine prescriptions from only one health care provider. Pain can be managed with many types of alternative treatments. Ask your health care provider to refer you to one or more specialists who can help you manage pain through: Physical or occupational therapy. Counseling (cognitive behavioral therapy). Good nutrition. Biofeedback. Massage. Meditation. Non-opioid medicine. Following a gentle exercise program. How to use opioid pain medicine Taking medicine Take your pain medicine exactly as told by your health care provider. Take it only when you need it. If your pain gets less severe, you may take less than your prescribed dose if your health care provider approves. If you are not having pain, do nottake pain medicine  unless your health care provider tells you to take it. If your pain is severe, do nottry to treat it yourself by taking more pills than instructed on your prescription. Contact your health care provider for help. Write down the times when you take your pain medicine. It is easy to become confused while on pain medicine. Writing the time can help you avoid overdose. Take other over-the-counter or prescription  medicines only as told by your health care provider. Keeping yourself and others safe  While you are taking opioid pain medicine: Do not drive, use machinery, or power tools. Do not sign legal documents. Do not drink alcohol. Do not take sleeping pills. Do not supervise children by yourself. Do not do activities that require climbing or being in high places. Do not go to a lake, river, ocean, spa, or swimming pool. Do not share your pain medicine with anyone. Keep pain medicine in a locked cabinet or in a secure area where pets and children cannot reach it. Stopping your use of opioids If you have been taking opioid medicine for more than a few weeks, you may need to slowly decrease (taper) how much you take until you stop completely. Tapering your use of opioids can decrease your risk of symptoms of withdrawal, such as: Pain and cramping in the abdomen. Nausea. Sweating. Sleepiness. Restlessness. Uncontrollable shaking (tremors). Cravings for the medicine. Do not attempt to taper your use of opioids on your own. Talk with your health care provider about how to do this. Your health care provider may prescribe a step-down schedule based on how much medicine you are taking and how long you have been taking it. Getting rid of leftover pills Do not save any leftover pills. Get rid of leftover pills safely by: Taking the medicine to a prescription take-back program. This is usually offered by the county or law enforcement. Bringing them to a pharmacy that has a drug disposal container. Flushing them down the toilet. Check the label or package insert of your medicine to see whether this is safe to do. Throwing them out in the trash. Check the label or package insert of your medicine to see whether this is safe to do. If it is safe to throw it out, remove the medicine from the original container, put it into a sealable bag or container, and mix it with used coffee grounds, food scraps, dirt, or  cat litter before putting it in the trash. Follow these instructions at home: Activity Do exercises as told by your health care provider. Avoid activities that make your pain worse. Return to your normal activities as told by your health care provider. Ask your health care provider what activities are safe for you. General instructions You may need to take these actions to prevent or treat constipation: Drink enough fluid to keep your urine pale yellow. Take over-the-counter or prescription medicines. Eat foods that are high in fiber, such as beans, whole grains, and fresh fruits and vegetables. Limit foods that are high in fat and processed sugars, such as fried or sweet foods. Keep all follow-up visits. This is important. Where to find support If you have been taking opioids for a long time, you may benefit from receiving support for quitting from a local support group or counselor. Ask your health care provider for a referral to these resources in your area. Where to find more information Centers for Disease Control and Prevention (CDC): http://www.wolf.info/ [place] Food and Drug Administration (FDA): [employer] Get help right away  if: You may have taken too much of an opioid (overdosed). Common symptoms of an overdose: Your breathing is slower or more shallow than normal. You have a very slow heartbeat (pulse). You have slurred speech. You have nausea and vomiting. Your pupils become very small. You have other potential symptoms: You are very confused. You faint or feel like you will faint. You have cold, clammy skin. You have blue lips or fingernails. You have thoughts of harming yourself or harming others. These symptoms may represent a serious problem that is an emergency. Do not wait to see if the symptoms will go away. Get medical help right away. Call your local emergency services (911 in the [place] Do not drive yourself to the hospital.  If you ever feel like you may hurt yourself or  others, or have thoughts about taking your own life, get help right away. Go to your nearest emergency department or: Call your local emergency services (911 in the [place] Call the [facility] 574-720-6542 in the [place] Call a suicide crisis helpline, such as the [place] at 7120338030 or 988 in the [place]. This is open 24 hours a day in the [place] Text the Crisis Text Line at 5815570802 (in the [place].). Summary Opioid medicines can help you manage moderate to severe pain for a short period of time. A pain treatment plan is an agreement between you and your health care provider. Discuss the goals of your treatment, including how much pain you might expect to have and how you will manage the pain. If you think that you or someone else may have taken too much of an opioid, get medical help right away. This information is not intended to replace advice given to you by your health care provider. Make sure you discuss any questions you have with your health care provider. Document Revised: 11/22/2020 Document Reviewed: 08/09/2020 Elsevier Patient Education  [place] directives: Yes Copies on file  Conditions/risks identified: None  Next appointment: Follow up in one year for your annual wellness visit.   Preventive Care 4 Years and Older, Male Preventive care refers to lifestyle choices and visits with your health care provider that can promote health and wellness. What does preventive care include? A yearly physical exam. This is also called an annual well check. Dental exams once or twice a year. Routine eye exams. Ask your health care provider how often you should have your eyes checked. Personal lifestyle choices, including: Daily care of your teeth and gums. Regular physical activity. Eating a healthy diet. Avoiding tobacco and drug use. Limiting alcohol use. Practicing safe sex. Taking low doses of aspirin every  day. Taking vitamin and mineral supplements as recommended by your health care provider. What happens during an annual well check? The services and screenings done by your health care provider during your annual well check will depend on your age, overall health, lifestyle risk factors, and family history of disease. Counseling  Your health care provider may ask you questions about your: Alcohol use. Tobacco use. Drug use. Emotional well-being. Home and relationship well-being. Sexual activity. Eating habits. History of falls. Memory and ability to understand (cognition). Work and work Statistician. Screening  You may have the following tests or measurements: Height, weight, and BMI. Blood pressure. Lipid and cholesterol levels. These may be checked every 5 years, or more frequently if you are over 11 years old. Skin check. Lung cancer screening. You may have this  screening every year starting at age 37 if you have a 30-pack-year history of smoking and currently smoke or have quit within the past 15 years. Fecal occult blood test (FOBT) of the stool. You may have this test every year starting at age 41. Flexible sigmoidoscopy or colonoscopy. You may have a sigmoidoscopy every 5 years or a colonoscopy every 10 years starting at age 53. Prostate cancer screening. Recommendations will vary depending on your family history and other risks. Hepatitis C blood test. Hepatitis B blood test. Sexually transmitted disease (STD) testing. Diabetes screening. This is done by checking your blood sugar (glucose) after you have not eaten for a while (fasting). You may have this done every 1-3 years. Abdominal aortic aneurysm (AAA) screening. You may need this if you are a current or former smoker. Osteoporosis. You may be screened starting at age 18 if you are at high risk. Talk with your health care provider about your test results, treatment options, and if necessary, the need for more  tests. Vaccines  Your health care provider may recommend certain vaccines, such as: Influenza vaccine. This is recommended every year. Tetanus, diphtheria, and acellular pertussis (Tdap, Td) vaccine. You may need a Td booster every 10 years. Zoster vaccine. You may need this after age 34. Pneumococcal 13-valent conjugate (PCV13) vaccine. One dose is recommended after age 64. Pneumococcal polysaccharide (PPSV23) vaccine. One dose is recommended after age 19. Talk to your health care provider about which screenings and vaccines you need and how often you need them. This information is not intended to replace advice given to you by your health care provider. Make sure you discuss any questions you have with your health care provider. Document Released: 05/26/2015 Document Revised: 01/17/2016 Document Reviewed: 02/28/2015 Elsevier Interactive Patient Education  2017 [place] Prevention in the Home Falls can cause injuries. They can happen to people of all ages. There are many things you can do to make your home safe and to help prevent falls. What can I do on the outside of my home? Regularly fix the edges of walkways and driveways and fix any cracks. Remove anything that might make you trip as you walk through a door, such as a raised step or threshold. Trim any bushes or trees on the path to your home. Use bright outdoor lighting. Clear any walking paths of anything that might make someone trip, such as rocks or tools. Regularly check to see if handrails are loose or broken. Make sure that both sides of any steps have handrails. Any raised decks and porches should have guardrails on the edges. Have any leaves, snow, or ice cleared regularly. Use sand or salt on walking paths during winter. Clean up any spills in your garage right away. This includes oil or grease spills. What can I do in the bathroom? Use night lights. Install grab bars by the toilet and in the tub and shower.  Do not use towel bars as grab bars. Use non-skid mats or decals in the tub or shower. If you need to sit down in the shower, use a plastic, non-slip stool. Keep the floor dry. Clean up any water that spills on the floor as soon as it happens. Remove soap buildup in the tub or shower regularly. Attach bath mats securely with double-sided non-slip rug tape. Do not have throw rugs and other things on the floor that can make you trip. What can I do in the bedroom? Use night lights. Make sure that you  have a light by your bed that is easy to reach. Do not use any sheets or blankets that are too big for your bed. They should not hang down onto the floor. Have a firm chair that has side arms. You can use this for support while you get dressed. Do not have throw rugs and other things on the floor that can make you trip. What can I do in the kitchen? Clean up any spills right away. Avoid walking on wet floors. Keep items that you use a lot in easy-to-reach places. If you need to reach something above you, use a strong step stool that has a grab bar. Keep electrical cords out of the way. Do not use floor polish or wax that makes floors slippery. If you must use wax, use non-skid floor wax. Do not have throw rugs and other things on the floor that can make you trip. What can I do with my stairs? Do not leave any items on the stairs. Make sure that there are handrails on both sides of the stairs and use them. Fix handrails that are broken or loose. Make sure that handrails are as long as the stairways. Check any carpeting to make sure that it is firmly attached to the stairs. Fix any carpet that is loose or worn. Avoid having throw rugs at the top or bottom of the stairs. If you do have throw rugs, attach them to the floor with carpet tape. Make sure that you have a light switch at the top of the stairs and the bottom of the stairs. If you do not have them, ask someone to add them for you. What else  can I do to help prevent falls? Wear shoes that: Do not have high heels. Have rubber bottoms. Are comfortable and fit you well. Are closed at the toe. Do not wear sandals. If you use a stepladder: Make sure that it is fully opened. Do not climb a closed stepladder. Make sure that both sides of the stepladder are locked into place. Ask someone to hold it for you, if possible. Clearly mark and make sure that you can see: Any grab bars or handrails. First and last steps. Where the edge of each step is. Use tools that help you move around (mobility aids) if they are needed. These include: Canes. Walkers. Scooters. Crutches. Turn on the lights when you go into a dark area. Replace any light bulbs as soon as they burn out. Set up your furniture so you have a clear path. Avoid moving your furniture around. If any of your floors are uneven, fix them. If there are any pets around you, be aware of where they are. Review your medicines with your doctor. Some medicines can make you feel dizzy. This can increase your chance of falling. Ask your doctor what other things that you can do to help prevent falls. This information is not intended to replace advice given to you by your health care provider. Make sure you discuss any questions you have with your health care provider. Document Released: 02/23/2009 Document Revised: 10/05/2015 Document Reviewed: 06/03/2014 Elsevier Interactive Patient Education  2017 [employer].

## 2021-07-25 ENCOUNTER — Telehealth (HOSPITAL_COMMUNITY): Payer: Self-pay | Admitting: *Deleted

## 2021-07-25 NOTE — Telephone Encounter (Signed)
Left message regarding cardiac rehab orientation tomorrow.Harrell Gave RN BSN  ?

## 2021-07-26 ENCOUNTER — Encounter (HOSPITAL_COMMUNITY)
Admission: RE | Admit: 2021-07-26 | Discharge: 2021-07-26 | Disposition: A | Discharge status: Home / Self Care | Payer: Medicare Other | Source: Ambulatory Visit | Attending: Internal Medicine | Admitting: Internal Medicine

## 2021-07-26 ENCOUNTER — Other Ambulatory Visit: Payer: Self-pay

## 2021-07-26 ENCOUNTER — Ambulatory Visit (HOSPITAL_COMMUNITY): Payer: Self-pay | Admitting: Pharmacist

## 2021-07-26 ENCOUNTER — Encounter (HOSPITAL_COMMUNITY): Payer: Self-pay

## 2021-07-26 VITALS — BP 76/0 | HR 95 | Ht 71.75 in | Wt 187.8 lb

## 2021-07-26 DIAGNOSIS — Z95811 Presence of heart assist device: Secondary | ICD-10-CM | POA: Insufficient documentation

## 2021-07-26 DIAGNOSIS — I5042 Chronic combined systolic (congestive) and diastolic (congestive) heart failure: Secondary | ICD-10-CM | POA: Insufficient documentation

## 2021-07-26 DIAGNOSIS — Z952 Presence of prosthetic heart valve: Secondary | ICD-10-CM | POA: Insufficient documentation

## 2021-07-26 HISTORY — DX: Atherosclerotic heart disease of native coronary artery without angina pectoris: I25.10

## 2021-07-26 LAB — POCT INR: INR: 2 (ref 2.0–3.0)

## 2021-07-26 NOTE — Progress Notes (Signed)
Cardiac Individual Treatment Plan ? ?Patient Details  ?Name: Brandon [name] ?MRN: [id number] ?Date of Birth: [birth year] ?Referring Provider:   ?Flowsheet Row CARDIAC REHAB PHASE II ORIENTATION from 07/26/2021 in [place]  ?Referring Provider Dr. Glori Bickers, MD  ? ?  ? ? ?Initial Encounter Date:  ?Flowsheet Row CARDIAC REHAB PHASE II ORIENTATION from 07/26/2021 in [place]  ?Date 07/26/21  ? ?  ? ? ?Visit Diagnosis: Heart failure, systolic and diastolic, chronic ([place]) ? ?04/06/21 LVAD HM3 ? ?04/06/21 S/P AVR (aortic valve replacement) ? ?Patient's Home Medications on Admission: ? ?Current Outpatient Medications:  ?  acetaminophen (TYLENOL) 500 MG tablet, Take 1,000 mg by mouth every 6 (six) hours as needed (pain.)., Disp: , Rfl:  ?  atorvastatin (LIPITOR) 80 MG tablet, TAKE 1 TABLET BY MOUTH ONCE DAILY AT 6PM. (Patient taking differently: Take 80 mg by mouth every evening.), Disp: 90 tablet, Rfl: 3 ?  Carboxymethylcellulose Sodium (ARTIFICIAL TEARS OP), Place 1 drop into both eyes daily as needed (for dryness or irritation)., Disp: , Rfl:  ?  eplerenone (INSPRA) 50 MG tablet, Take 0.5 tablets (25 mg total) by mouth in the morning., Disp: 30 tablet, Rfl: 6 ?  ezetimibe (ZETIA) 10 MG tablet, TAKE ONE TABLET BY MOUTH DAILY (Patient taking differently: Take 10 mg by mouth in the morning.), Disp: 90 tablet, Rfl: 3 ?  gabapentin (NEURONTIN) 300 MG capsule, Take 1 capsule (300 mg total) by mouth 3 (three) times daily. (Patient taking differently: Take 100 mg by mouth 3 (three) times daily.), Disp: 90 capsule, Rfl: 5 ?  losartan (COZAAR) 25 MG tablet, Take 1/2 tablet (12.5 mg total) by mouth daily., Disp: 30 tablet, Rfl: 5 ?  melatonin 5 MG TABS, Take 1 tablet (5 mg total) by mouth at bedtime. (Patient taking differently: Take 10 mg by mouth 3 (three) times a week.), Disp: 30 tablet, Rfl: 6 ?  ondansetron (ZOFRAN) 4 MG tablet, Take 1 tablet (4 mg total) by  mouth every 8 (eight) hours as needed for nausea or vomiting., Disp: 20 tablet, Rfl: 3 ?  pantoprazole (PROTONIX) 40 MG tablet, Take 1 tablet (40 mg total) by mouth daily., Disp: 30 tablet, Rfl: 6 ?  sertraline (ZOLOFT) 100 MG tablet, Take 1 tablet (100 mg total) by mouth daily., Disp: 30 tablet, Rfl: 2 ?  tamsulosin (FLOMAX) 0.4 MG CAPS capsule, TAKE 1 CAPSULE(0.4 MG) BY MOUTH DAILY (Patient taking differently: Take 0.4 mg by mouth daily.), Disp: 90 capsule, Rfl: 1 ?  torsemide (DEMADEX) 20 MG tablet, Take 1 tablet (20 mg total) by mouth as needed. (Patient taking differently: Take 20 mg by mouth daily as needed (Fluid).), Disp: 30 tablet, Rfl: 5 ?  traMADol (ULTRAM) 50 MG tablet, Take 1 tablet (50 mg total) by mouth every 6 (six) hours as needed for moderate pain. (Patient taking differently: Take 100 mg by mouth 2 (two) times daily as needed for moderate pain.), Disp: 30 tablet, Rfl: 0 ?  warfarin (COUMADIN) 2.5 MG tablet, Take 2.5 mg (1 tablet) every Monday/Friday and 5 mg (2 tablets) all other days or as instructed by LVAD clinic. (Patient taking differently: Take 5 mg by mouth at bedtime. or as instructed by LVAD clinic.), Disp: 75 tablet, Rfl: 11 ?  nystatin ointment (MYCOSTATIN), Apply 1 application topically 2 (two) times daily. (Patient not taking: Reported on 07/24/2021), Disp: 30 g, Rfl: 3 ? ?Past Medical History: ?Past Medical History:  ?Diagnosis Date  ? AICD (  automatic cardioverter/defibrillator) present 08/31/2019  ? Ankylosing spondylitis ([place])   ? Arthritis   ? BENIGN PROSTATIC HYPERTROPHY 06/07/2008  ? CHF (congestive heart failure) ([place])   ? COLONIC POLYPS, HX OF 06/07/2008  ? Depression   ? H/O hiatal hernia   ? Heart failure ([place])   ? HYPERLIPIDEMIA 06/07/2008  ? HYPERTENSION 06/07/2008  ? Myocardial infarction [facility]) 2005  ? NSTEMI, s/p LAD stent  ? NEPHROLITHIASIS, HX OF 06/07/2008  ? STEMI (ST elevation myocardial infarction) ([place]) 04/27/2019  ? ? ?Tobacco Use: ?Social History  ? ?Tobacco Use   ?Smoking Status Former  ? Packs/day: 1.00  ? Years: 40.00  ? Pack years: 40.00  ? Types: Cigarettes  ? Quit date: 07/26/2019  ? Years since quitting: 2.0  ?Smokeless Tobacco Never  ?Tobacco Comments  ? 1-1.5 pks per year x 40-45 yrs  ? ? ?Labs: ?Recent Review Flowsheet Data   ? ? Labs for [facility]P Cardiac and Pulmonary Rehab Latest Ref Rng & Units 04/25/2021 04/26/2021 04/27/2021 04/28/2021 05/04/2021  ? Cholestrol 0 - 200 mg/dL - - - - -  ? LDLCALC 0 - 99 mg/dL - - - - -  ? HDL >40 mg/dL - - - - -  ? Trlycerides <150 mg/dL - - - - -  ? Hemoglobin A1c 4.8 - 5.6 % - - - - -  ? PHART 7.350 - 7.450 - - - - -  ? PCO2ART 32.0 - 48.0 mmHg - - - - -  ? HCO3 20.0 - 28.0 mmol/L - - - - -  ? TCO2 22 - 32 mmol/L - - - - -  ? ACIDBASEDEF 0.0 - 2.0 mmol/L - - - - -  ? O2SAT % 69.5 59.5 50.9 36.8 51.2  ? ?  ? ? ?Capillary Blood Glucose: ?Lab Results  ?Component Value Date  ? GLUCAP 100 (H) 05/09/2021  ? GLUCAP 84 05/09/2021  ? GLUCAP 122 (H) 05/08/2021  ? GLUCAP 96 05/08/2021  ? GLUCAP 106 (H) 05/08/2021  ? ? ? ?Exercise Target Goals: ?Exercise Program Goal: ?Individual exercise prescription set using results from initial 6 min walk test and THRR while considering  patient?s activity barriers and safety.  ? ?Exercise Prescription Goal: ?Starting with aerobic activity 30 plus minutes a day, 3 days per week for initial exercise prescription. Provide home exercise prescription and guidelines that participant acknowledges understanding prior to discharge. ? ?Activity Barriers & Risk Stratification: ? Activity Barriers & Cardiac Risk Stratification - 07/26/21 1152   ? ?  ? Activity Barriers & Cardiac Risk Stratification  ? Activity Barriers Neck/Spine Problems;Back Problems;Joint Problems;Muscular Weakness;Shortness of Breath;Balance Concerns   ? Cardiac Risk Stratification High   ? ?  ?  ? ?  ? ? ?6 Minute Walk: ? 6 Minute Walk   ? ? [place] Name 07/26/21 1144  ?  ?  ?  ? 6 Minute Walk  ? Phase Initial    ? Distance 800 feet    ? Distance %  Change 7.92 %    ? Walk Time 6 minutes    ? # of Rest Breaks 2  Sitting rest 3:00-3:30 for high HR, standing rest 5:50-6:00 due to pain    ? MPH 1.52    ? METS 2.26    ? RPE 12    ? Perceived Dyspnea  2    ? VO2 Peak 7.92    ? Symptoms Yes (comment)    ? Comments Bilate hip pain 5/10 and bilate leg pain 5/10.  DYS 2 SOB while walking    ? Resting HR 95 bpm    ? Resting BP 76/0  MAP    ? Resting Oxygen Saturation  96 %    ? Exercise Oxygen Saturation  during 6 min walk 98 %    ? Max Ex. HR 135 bpm    ? Max Ex. BP 84/0  MAP    ? 2 Minute Post BP 74/0  MAP    ? ?  ?  ? ?  ? ? ?Oxygen Initial Assessment: ? ? ?Oxygen Re-Evaluation: ? ? ?Oxygen Discharge (Final Oxygen Re-Evaluation): ? ? ?Initial Exercise Prescription: ? Initial Exercise Prescription - 07/26/21 1100   ? ?  ? Date of Initial Exercise RX and Referring Provider  ? Date 07/26/21   ? Referring Provider Dr. Glori Bickers, MD   ? Expected Discharge Date 09/21/21   ?  ? NuStep  ? Level 1   ? SPM 60   ? Minutes 25   ? METs 1.7   ?  ? Prescription Details  ? Frequency (times per week) 3   ? Duration Progress to 30 minutes of continuous aerobic without signs/symptoms of physical distress   ?  ? Intensity  ? THRR 40-80% of Max Heartrate 61-122   ? Ratings of Perceived Exertion 11-13   ? Perceived Dyspnea 0-4   ?  ? Progression  ? Progression Continue progressive overload as per policy without signs/symptoms or physical distress.   ?  ? Resistance Training  ? Training Prescription Yes   ? Weight 3   ? Reps 10-15   ? ?  ?  ? ?  ? ? ?Perform Capillary Blood Glucose checks as needed. ? ?Exercise Prescription Changes: ? ? ?Exercise Comments: ? ? ?Exercise Goals and Review: ? ? Exercise Goals   ? ? [place] Name 07/26/21 1155  ?  ?  ?  ?  ?  ? Exercise Goals  ? Increase Physical Activity Yes      ? Intervention Provide advice, education, support and counseling about physical activity/exercise needs.;Develop an individualized exercise prescription for aerobic and resistive  training based on initial evaluation findings, risk stratification, comorbidities and participant's personal goals.      ? Expected Outcomes Short Term: Attend rehab on a regular basis to increase amount of ph

## 2021-07-26 NOTE — Progress Notes (Signed)
LVAD INR 

## 2021-07-26 NOTE — Progress Notes (Addendum)
Cardiac Rehab Medication Review by a nurse ? ?Does the patient  feel that his/her medications are working for him/her?  YES  ? ?Has the patient been experiencing any side effects to the medications prescribed?   NO ? ?Does the patient measure his/her own blood pressure or blood glucose at home?   NO ? ?Does the patient have any problems obtaining medications due to transportation or finances?    NO ? ?Understanding of regimen: good ?Understanding of indications: good ?Potential of compliance: good ? ? ? ?Nurse comments: Brandon [name] is taking his medications as prescribed and has a good understanding of what his medications are for. Brandon [name] does not check his blood pressures at home as he has a LVAD. ? ? ? ?Harrell Gave RN BSN ?07/26/2021 12:19 PM ?  ?

## 2021-07-30 ENCOUNTER — Encounter (HOSPITAL_COMMUNITY)
Admission: RE | Admit: 2021-07-30 | Discharge: 2021-07-30 | Disposition: A | Discharge status: Home / Self Care | Payer: Medicare Other | Source: Ambulatory Visit | Attending: Internal Medicine | Admitting: Internal Medicine

## 2021-07-30 ENCOUNTER — Other Ambulatory Visit: Payer: Self-pay

## 2021-07-30 DIAGNOSIS — Z952 Presence of prosthetic heart valve: Secondary | ICD-10-CM | POA: Diagnosis present

## 2021-07-30 DIAGNOSIS — Z95811 Presence of heart assist device: Secondary | ICD-10-CM

## 2021-07-30 DIAGNOSIS — I5042 Chronic combined systolic (congestive) and diastolic (congestive) heart failure: Secondary | ICD-10-CM

## 2021-07-30 NOTE — Progress Notes (Signed)
Daily Session Note ? ?Patient Details  ?Name: Brandon [name] ?MRN: [id number] ?Date of Birth: May 16, 1955 ?Referring Provider:   ?Flowsheet Row CARDIAC REHAB PHASE II ORIENTATION from 07/26/2021 in [place]  ?Referring Provider Dr. Glori Bickers, MD  ? ?  ? ? ?Encounter Date: 07/30/2021 ? ?Check In: ? Session Check In - 07/30/21 1446   ? ?  ? Check-In  ? Supervising physician immediately available to respond to emergencies [facility] immediately available   ? Physician(s) Dr. Cruzita Lederer   ? Location [facility]   ? Staff Present Barnet Pall, RN, BSN;Jetta Walker BS, ACSM EP-C, Exercise Physiologist;Olinty Cottonwood, MS, ACSM CEP, Exercise Physiologist;David [place], MS, ACSM-CEP, CCRP, Exercise Physiologist   ? Virtual Visit No   ? Medication changes reported     No   ? Fall or balance concerns reported    No   ? Tobacco Cessation No Change   ? Current number of cigarettes/nicotine per day     0   ? Warm-up and Cool-down Performed as group-led instruction   ? Resistance Training Performed Yes   ? VAD Patient? Yes   ? PAD/SET Patient? No   ?  ? VAD patient  ? Has back up controller? Yes   ? Has spare charged batteries? Yes   ? Has battery cables? Yes   ? Has compatible battery clips? Yes   ?  ? Pain Assessment  ? Currently in Pain? No/denies   ? Pain Score 0-No pain   ? Multiple Pain Sites No   ? ?  ?  ? ?  ? ? ?Capillary Blood Glucose: ?No results found for this or any previous visit (from the past 24 hour(s)). ? ? Exercise Prescription Changes - 07/30/21 1652   ? ?  ? Response to Exercise  ? Blood Pressure (Admit) 80/0   MAP  ? Blood Pressure (Exercise) 84/0   MAP  ? Blood Pressure (Exit) 78/0   MAP  ? Heart Rate (Admit) 93 bpm   ? Heart Rate (Exercise) 124 bpm   ? Heart Rate (Exit) 97 bpm   ? Rating of Perceived Exertion (Exercise) 11   ? Perceived Dyspnea (Exercise) 0   ? Symptoms 0   ? Comments Pt first day in the CRP2 program   ? Duration Progress to 30  minutes of  aerobic without signs/symptoms of physical distress   ? Intensity THRR unchanged   ?  ? Progression  ? Progression Continue to progress workloads to maintain intensity without signs/symptoms of physical distress.   ? Average METs 2.4   ?  ? Resistance Training  ? Training Prescription Yes   ? Weight 3   ? Reps 10-15   ? Time 10 Minutes   ?  ? NuStep  ? Level 1   ? SPM 85   ? Minutes 25   ? METs 2.4   ? ?  ?  ? ?  ? ? ?Social History  ? ?Tobacco Use  ?Smoking Status Former  ? Packs/day: 1.00  ? Years: 40.00  ? Pack years: 40.00  ? Types: Cigarettes  ? Quit date: 07/26/2019  ? Years since quitting: 2.0  ?Smokeless Tobacco Never  ?Tobacco Comments  ? 1-1.5 pks per year x 40-45 yrs  ? ? ?Goals Met:  ?Exercise tolerated well ?No report of concerns or symptoms today ?Strength training completed today ? ?Goals Unmet:  ?Not Applicable ? ?Comments: Brandon [name] started cardiac rehab today.  Pt  tolerated light exercise without difficulty. VSS, telemetry-Regular  rhythm, asymptomatic.  Medication list reconciled. Pt denies barriers to medicaiton compliance.  PSYCHOSOCIAL ASSESSMENT:  PHQ-3. Pt exhibits positive coping skills, hopeful outlook with supportive family. No psychosocial needs identified at this time, no psychosocial interventions necessary.   Pt oriented to exercise equipment and routine.    Understanding verbalized.  Brandon [name] said that he enjoyed participating in cardiac rehab for his first day and looks forward to returning on Wednesday. Brandon Gave RN BSN  ? ? ?Dr. Fransico Him is Medical Director for Cardiac Rehab at [facility]. ?

## 2021-08-01 ENCOUNTER — Other Ambulatory Visit: Payer: Self-pay

## 2021-08-01 ENCOUNTER — Encounter (HOSPITAL_COMMUNITY)
Admission: RE | Admit: 2021-08-01 | Discharge: 2021-08-01 | Disposition: A | Discharge status: Home / Self Care | Payer: Medicare Other | Source: Ambulatory Visit | Attending: Internal Medicine | Admitting: Internal Medicine

## 2021-08-01 DIAGNOSIS — I5042 Chronic combined systolic (congestive) and diastolic (congestive) heart failure: Secondary | ICD-10-CM

## 2021-08-01 DIAGNOSIS — Z95811 Presence of heart assist device: Secondary | ICD-10-CM

## 2021-08-02 ENCOUNTER — Ambulatory Visit (HOSPITAL_COMMUNITY): Payer: Self-pay | Admitting: Pharmacist

## 2021-08-02 LAB — POCT INR: INR: 1.9 — AB (ref 2.0–3.0)

## 2021-08-02 NOTE — Progress Notes (Addendum)
QUALITY OF LIFE SCORE REVIEW ? Pt completed Quality of Life survey as a participant in Cardiac Rehab.  Scores 21.0 or below are considered low.  Pt score very low in several areas Overall 18.06, Health and Function 15.23, socioeconomic 20.86, physiological and spiritual 19.67, family 22.50. Patient quality of life slightly altered by physical constraints which limits ability to perform as prior to recent cardiac illness.Kevon denies being depressed as he has been taking an antidepressant since his hospitalization S/P LVAD implantation. Brandon [name] denies the need for counseling at this time.  Offered emotional support and reassurance.  Will continue to monitor and intervene as necessary. Brandon [name] says he already feels better since starting cardiac rehab this week. Will forward to Dr Brandon [name], Brandon [name]'s primary care physician.Brandon Gave RN BSN    ?

## 2021-08-02 NOTE — Progress Notes (Deleted)
QUALITY OF LIFE SCORE REVIEW ? Pt completed Quality of Life survey as a participant in Cardiac Rehab.  Scores 21.0 or below are considered low.  Pt score very low in several areas Overall 18.06, Health and Function 15.23, socioeconomic 20.86, physiological and spiritual 19.67, family 22.50. Patient quality of life slightly altered by physical constraints which limits ability to perform as prior to recent cardiac illness.Brandon [name] denies being depressed as he has been taking since his hospitalization S/P LVAD implantation. Brandon [name] denies the need for counseling at this time     Offered emotional support and reassurance.  Will continue to monitor and intervene as necessary. Brandon [name] says he already feels better since starting cardiac rehab this week. Will forward to Dr Grier Mitts, [name]'s primary care physician.Brandon Gave RN BSN    ?

## 2021-08-02 NOTE — Progress Notes (Signed)
LVAD INR 

## 2021-08-03 ENCOUNTER — Other Ambulatory Visit: Payer: Self-pay

## 2021-08-03 ENCOUNTER — Encounter (HOSPITAL_COMMUNITY)
Admission: RE | Admit: 2021-08-03 | Discharge: 2021-08-03 | Disposition: A | Discharge status: Home / Self Care | Payer: Medicare Other | Source: Ambulatory Visit | Attending: Internal Medicine | Admitting: Internal Medicine

## 2021-08-03 DIAGNOSIS — Z952 Presence of prosthetic heart valve: Secondary | ICD-10-CM

## 2021-08-03 DIAGNOSIS — I5042 Chronic combined systolic (congestive) and diastolic (congestive) heart failure: Secondary | ICD-10-CM | POA: Diagnosis not present

## 2021-08-03 DIAGNOSIS — Z95811 Presence of heart assist device: Secondary | ICD-10-CM

## 2021-08-04 ENCOUNTER — Other Ambulatory Visit (HOSPITAL_COMMUNITY): Payer: Self-pay

## 2021-08-06 ENCOUNTER — Encounter (HOSPITAL_COMMUNITY)
Admission: RE | Admit: 2021-08-06 | Discharge: 2021-08-06 | Disposition: A | Discharge status: Home / Self Care | Payer: Medicare Other | Source: Ambulatory Visit | Attending: Internal Medicine | Admitting: Internal Medicine

## 2021-08-06 ENCOUNTER — Other Ambulatory Visit: Payer: Self-pay

## 2021-08-06 DIAGNOSIS — I5042 Chronic combined systolic (congestive) and diastolic (congestive) heart failure: Secondary | ICD-10-CM

## 2021-08-06 DIAGNOSIS — Z952 Presence of prosthetic heart valve: Secondary | ICD-10-CM

## 2021-08-06 DIAGNOSIS — Z95811 Presence of heart assist device: Secondary | ICD-10-CM

## 2021-08-07 ENCOUNTER — Other Ambulatory Visit (HOSPITAL_COMMUNITY): Payer: Self-pay | Admitting: *Deleted

## 2021-08-07 ENCOUNTER — Other Ambulatory Visit (HOSPITAL_COMMUNITY): Payer: Self-pay

## 2021-08-07 DIAGNOSIS — Z7901 Long term (current) use of anticoagulants: Secondary | ICD-10-CM

## 2021-08-07 DIAGNOSIS — Z95811 Presence of heart assist device: Secondary | ICD-10-CM

## 2021-08-08 ENCOUNTER — Other Ambulatory Visit: Payer: Self-pay

## 2021-08-08 ENCOUNTER — Encounter (HOSPITAL_COMMUNITY)
Admission: RE | Admit: 2021-08-08 | Discharge: 2021-08-08 | Disposition: A | Discharge status: Home / Self Care | Payer: Medicare Other | Source: Ambulatory Visit | Attending: Internal Medicine | Admitting: Internal Medicine

## 2021-08-08 DIAGNOSIS — I5042 Chronic combined systolic (congestive) and diastolic (congestive) heart failure: Secondary | ICD-10-CM

## 2021-08-08 DIAGNOSIS — Z952 Presence of prosthetic heart valve: Secondary | ICD-10-CM

## 2021-08-08 DIAGNOSIS — Z95811 Presence of heart assist device: Secondary | ICD-10-CM

## 2021-08-08 MED ORDER — EZETIMIBE 10 MG PO TABS
10.0000 mg | ORAL_TABLET | Freq: Every day | ORAL | 0 refills | Status: DC
Start: 2021-08-08 — End: 2021-08-09

## 2021-08-09 ENCOUNTER — Ambulatory Visit (HOSPITAL_COMMUNITY): Payer: Self-pay | Admitting: Pharmacist

## 2021-08-09 ENCOUNTER — Encounter (HOSPITAL_COMMUNITY): Payer: Self-pay

## 2021-08-09 ENCOUNTER — Ambulatory Visit (HOSPITAL_COMMUNITY)
Admission: RE | Admit: 2021-08-09 | Discharge: 2021-08-09 | Disposition: A | Discharge status: Home / Self Care | Payer: Medicare Other | Source: Ambulatory Visit | Attending: Internal Medicine | Admitting: Internal Medicine

## 2021-08-09 VITALS — BP 103/71 | HR 97 | Temp 97.7°F | Ht 72.0 in | Wt 191.0 lb

## 2021-08-09 DIAGNOSIS — I110 Hypertensive heart disease with heart failure: Secondary | ICD-10-CM | POA: Insufficient documentation

## 2021-08-09 DIAGNOSIS — E785 Hyperlipidemia, unspecified: Secondary | ICD-10-CM | POA: Diagnosis not present

## 2021-08-09 DIAGNOSIS — Z953 Presence of xenogenic heart valve: Secondary | ICD-10-CM | POA: Diagnosis not present

## 2021-08-09 DIAGNOSIS — Z9889 Other specified postprocedural states: Secondary | ICD-10-CM | POA: Diagnosis not present

## 2021-08-09 DIAGNOSIS — Z7901 Long term (current) use of anticoagulants: Secondary | ICD-10-CM

## 2021-08-09 DIAGNOSIS — I252 Old myocardial infarction: Secondary | ICD-10-CM | POA: Insufficient documentation

## 2021-08-09 DIAGNOSIS — I5023 Acute on chronic systolic (congestive) heart failure: Secondary | ICD-10-CM | POA: Diagnosis not present

## 2021-08-09 DIAGNOSIS — Z95811 Presence of heart assist device: Secondary | ICD-10-CM | POA: Diagnosis not present

## 2021-08-09 DIAGNOSIS — I2510 Atherosclerotic heart disease of native coronary artery without angina pectoris: Secondary | ICD-10-CM | POA: Diagnosis not present

## 2021-08-09 DIAGNOSIS — Z952 Presence of prosthetic heart valve: Secondary | ICD-10-CM

## 2021-08-09 DIAGNOSIS — E43 Unspecified severe protein-calorie malnutrition: Secondary | ICD-10-CM | POA: Diagnosis not present

## 2021-08-09 DIAGNOSIS — I5022 Chronic systolic (congestive) heart failure: Secondary | ICD-10-CM | POA: Insufficient documentation

## 2021-08-09 DIAGNOSIS — I480 Paroxysmal atrial fibrillation: Secondary | ICD-10-CM | POA: Insufficient documentation

## 2021-08-09 DIAGNOSIS — Z4801 Encounter for change or removal of surgical wound dressing: Secondary | ICD-10-CM | POA: Insufficient documentation

## 2021-08-09 DIAGNOSIS — Z79899 Other long term (current) drug therapy: Secondary | ICD-10-CM | POA: Diagnosis not present

## 2021-08-09 DIAGNOSIS — E876 Hypokalemia: Secondary | ICD-10-CM | POA: Insufficient documentation

## 2021-08-09 LAB — CBC
HCT: 41.6 % (ref 39.0–52.0)
Hemoglobin: 13 g/dL (ref 13.0–17.0)
MCH: 25.3 pg — ABNORMAL LOW (ref 26.0–34.0)
MCHC: 31.3 g/dL (ref 30.0–36.0)
MCV: 81.1 fL (ref 80.0–100.0)
Platelets: 196 10*3/uL (ref 150–400)
RBC: 5.13 MIL/uL (ref 4.22–5.81)
RDW: 19.5 % — ABNORMAL HIGH (ref 11.5–15.5)
WBC: 5.9 10*3/uL (ref 4.0–10.5)
nRBC: 0 % (ref 0.0–0.2)

## 2021-08-09 LAB — BASIC METABOLIC PANEL
Anion gap: 7 (ref 5–15)
BUN: 15 mg/dL (ref 8–23)
CO2: 25 mmol/L (ref 22–32)
Calcium: 9.2 mg/dL (ref 8.9–10.3)
Chloride: 106 mmol/L (ref 98–111)
Creatinine, Ser: 0.86 mg/dL (ref 0.61–1.24)
GFR, Estimated: >60 mL/min (ref >60)
Glucose, Bld: 101 mg/dL — ABNORMAL HIGH (ref 70–99)
Potassium: 3.2 mmol/L — ABNORMAL LOW (ref 3.5–5.1)
Sodium: 138 mmol/L (ref 135–145)

## 2021-08-09 LAB — PROTIME-INR
INR: 2.1 — ABNORMAL HIGH (ref 0.8–1.2)
Prothrombin Time: 23.9 seconds — ABNORMAL HIGH (ref 11.4–15.2)

## 2021-08-09 LAB — POCT INR: INR: 2.1 (ref 2.0–3.0)

## 2021-08-09 LAB — LACTATE DEHYDROGENASE: LDH: 134 U/L (ref 98–192)

## 2021-08-09 MED ORDER — EZETIMIBE 10 MG PO TABS: 10.0000 mg | ORAL_TABLET | Freq: Every day | ORAL | 3 refills | Status: DC

## 2021-08-09 NOTE — Progress Notes (Signed)
? ?LVAD Follow-up Clinic Note ? ?PCP: [name], MD ?HF MD: DB ? ? ?HPI: ? ?Brandon [name] is a 66 year old with history of tobacco use, CAD s/p previous anterior MI, severe systolic HF s/p HM-3 VAD 43/32/95, severe AS s/p AVR, endocarditis.  ?  ?Admitted 12/20 with anterior ST elevation MI. Cath with chronically occluded RCA (L>>R collaterals) and thrombotic occlusion of proximal LAD treated with PCI. Echo w/ reduced LVEF 30-35% w/ apical aneurysm. RV ok. Post cath, he required milrinone for cardiogenic shock. Subsequently underwent Barostim. ?  ?Underwent outpatient [facility] 11/29/20 as part of VAD w/u. Post procedure, developed severe rigors and fever. Admitted to ICU Blood Cx + strep. 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  Likely low-flow low gradient AS. Underwent ICD extraction..  ?  ?Admitted for cardiogenic shock in 10/22.  He underwent placement of HM3 VAD and aortic valve replacement with 25 mm Edwards Inspiris Resilia valve on 04/06/21. Surgical path came back 11/28 with evidence of possible "acute aortic valve endocarditis". Started on IV ceftriaxone x 6 weeks  to cover Streptococcus gordonae that was isolated previously ? ?Hospital course c/b AF, hyponatremia, bilateral pleural effusions s/p bilateral thoracentesis and COVID infection. Discharged 05/09/21.  ?  ?Follow up for Heart Failure/LVAD: Here for routine f/u.Feels great, Doing all activities without limitation. Good appetite. Has purchased VAD concealed carry shirt and feels much better with it. Denies orthopnea or PND. + pocket pain if lying on left side  No fevers, chills or problems with driveline. No bleeding, melena or neuro symptoms. No VAD alarms. Taking all meds as prescribed.  ? ?  ?VAD Interrogation: ?Speed: 5800 ?Flow: 5.1 ?Power: 4.8w    ?PI: 3.5 ?Alarms: none ?Events: >70 PI events yesterday ? ?Fixed speed: 5800 ?Low speed limit: 5500 ?  ?Primary controller: back up battery  due for replacement in 29 months ?Secondary controller:  back up battery due for replacement in 28 months  ?  ?I reviewed the LVAD parameters from today and compared the results to the patient's prior recorded data. LVAD interrogation was NEGATIVE for significant power changes, NEGATIVE for clinical alarms and STABLE for PI events/speed drops. No programming changes were made and pump is functioning within specified parameters. Pt is performing daily controller and system monitor self tests along with completing weekly and monthly maintenance for LVAD equipment. ?  ?LVAD equipment check completed and is in good working order. Back-up equipment  present. Charged back up battery and performed self-test on equipment.  ?  ?Annual Equipment Maintenance on UBC/PM was performed on 12/22.  ?  ? ? ?Past Medical History:  ?Diagnosis Date  ? AICD (automatic cardioverter/defibrillator) present 08/31/2019  ? Ankylosing spondylitis ([place])   ? Arthritis   ? BENIGN PROSTATIC HYPERTROPHY 06/07/2008  ? CHF (congestive heart failure) ([place])   ? COLONIC POLYPS, HX OF 06/07/2008  ? Coronary artery disease   ? Depression   ? H/O hiatal hernia   ? Heart failure ([place])   ? HYPERLIPIDEMIA 06/07/2008  ? HYPERTENSION 06/07/2008  ? Myocardial infarction [facility]) 2005  ? NSTEMI, s/p LAD stent  ? NEPHROLITHIASIS, HX OF 06/07/2008  ? STEMI (ST elevation myocardial infarction) ([place]) 04/27/2019  ? ? ?Current Outpatient Medications  ?Medication Sig Dispense Refill  ? acetaminophen (TYLENOL) 500 MG tablet Take 1,000 mg by mouth every 6 (six) hours as needed (pain.).    ? atorvastatin (LIPITOR) 80 MG tablet TAKE 1 TABLET BY MOUTH ONCE DAILY  AT 6PM. (Patient taking differently: Take 80 mg by mouth every evening.) 90 tablet 3  ? Carboxymethylcellulose Sodium (ARTIFICIAL TEARS OP) Place 1 drop into both eyes daily as needed (for dryness or irritation).    ? eplerenone (INSPRA) 50 MG tablet Take 0.5 tablets (25 mg total) by mouth in the morning. 30 tablet 6  ?  gabapentin (NEURONTIN) 300 MG capsule Take 1 capsule (300 mg total) by mouth 3 (three) times daily. (Patient taking differently: Take 100 mg by mouth 3 (three) times daily.) 90 capsule 5  ? losartan (COZAAR) 25 MG tablet Take 1/2 tablet (12.5 mg total) by mouth daily. 30 tablet 5  ? melatonin 5 MG TABS Take 1 tablet (5 mg total) by mouth at bedtime. (Patient taking differently: Take 10 mg by mouth 3 (three) times a week.) 30 tablet 6  ? nystatin ointment (MYCOSTATIN) Apply 1 application topically 2 (two) times daily. 30 g 3  ? pantoprazole (PROTONIX) 40 MG tablet Take 1 tablet (40 mg total) by mouth daily. 30 tablet 6  ? sertraline (ZOLOFT) 100 MG tablet Take 1 tablet (100 mg total) by mouth daily. 30 tablet 2  ? tamsulosin (FLOMAX) 0.4 MG CAPS capsule TAKE 1 CAPSULE(0.4 MG) BY MOUTH DAILY (Patient taking differently: Take 0.4 mg by mouth daily.) 90 capsule 1  ? traMADol (ULTRAM) 50 MG tablet Take 1 tablet (50 mg total) by mouth every 6 (six) hours as needed for moderate pain. (Patient taking differently: Take 100 mg by mouth 2 (two) times daily as needed for moderate pain.) 30 tablet 0  ? warfarin (COUMADIN) 2.5 MG tablet Take 2.5 mg (1 tablet) every Monday/Friday and 5 mg (2 tablets) all other days or as instructed by LVAD clinic. (Patient taking differently: Take 5 mg by mouth at bedtime. or as instructed by LVAD clinic.) 75 tablet 11  ? ezetimibe (ZETIA) 10 MG tablet Take 1 tablet (10 mg total) by mouth daily. 90 tablet 3  ? ondansetron (ZOFRAN) 4 MG tablet Take 1 tablet (4 mg total) by mouth every 8 (eight) hours as needed for nausea or vomiting. (Patient not taking: Reported on 08/09/2021) 20 tablet 3  ? torsemide (DEMADEX) 20 MG tablet Take 1 tablet (20 mg total) by mouth as needed. (Patient not taking: Reported on 08/09/2021) 30 tablet 5  ? ?No current facility-administered medications for this encounter.  ? ? ?Patient has no known allergies. ? ? ? ?Vitals:  ? 08/09/21 1338 08/09/21 1345  ?BP: (!) 90/0 103/71   ?Pulse:  97  ?Temp:  97.7 ?F (36.5 ?C)  ?TempSrc:  Oral  ?Weight:  86.6 kg (191 lb)  ?Height:  6' (1.829 m)  ? ? ?Vital Signs:     ?Temp: 97.7       ?Doppler Pressure: 90 ?Automatc BP:  103/71 (91)      ?HR:  97                          ?SPO2:  UTO ?  ?Weight: 191 lb w/ eqt ?Last weight: 182.6 lb ?  ?  ? ?Physical Exam: ?General:  NAD.  ?HEENT: normal  ?Neck: supple. JVP not elevated.  Carotids 2+ bilat; no bruits. No lymphadenopathy or thryomegaly appreciated. ?Cor: LVAD hum.  ?Lungs: Clear. ?Abdomen: soft, nontender, non-distended. No hepatosplenomegaly. No bruits or masses. Good bowel sounds. Driveline site clean. Anchor in place.  ?Extremities: no cyanosis, clubbing, rash. Warm no edema  ?Neuro: alert & oriented x 3. No  focal deficits. Moves all 4 without problem  ? ? ? ?ASSESSMENT AND PLAN: ? ?1. Chronic systolic HFr EF due to iCM ?- Echo 05/19/20 EF 20-25% RV mildly HK. moderate AS  Mean gradient 13 AVA 1.2 cm2 DI 0.30 ?- s/p HM-III VAD + bioprosthetic AVR 04/06/21 ?- Doing great. NYHA I ?- Orthostasis resolved, Off al diuretics  ?- Continue Losartan 12.5 mg daily ?- Continue eplerenone 12.5 mg daily ?- Continue CR ?  ?2. HM-3 LVAD implant ?- VAD interrogated personally. Parameters stable. ?- ASA stopped 12/26 ?- INR 2.1 Goal 2.0-3.0 Discussed dosing with PharmD personally. ?- LDH 134 ?- Hgb 13.0 ?- MAPs ok  ?  ?3. Severe low-flow aortic stenosis s/p AVR ?- s/p VAD/bioprosthetic AVR on 11/25 ?  ?4. CAD  ?- s/p anterior STEMI (12/20). LHC showed chronically occluded RCA (with L>>R collaterals) and thrombotic occlusion of proximal LAD. Underwent PCI of LAD. ?- Now s/p VAD ?- No s/s angina ?- Continue atorvastatin 80.  ?- off ASA.  ? ?5. Severe protein-calorie malnutrition ?- resolved ?  ?6. Paroxysmal Atrial Fibrillation w/ RVR ?- Remains in NSR  ?- Off amio ?- on warfarin.  ?  ?7. 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  ?- Bcx recollected and pending  ?- Tissue sent to [facility] in [place] for broad 16 S ribosomal sequencing for bacterial pathogens as well as T wet Bhilai Bartonella Brucella  ?- Ceftriaxone 2 g IV

## 2021-08-09 NOTE — Progress Notes (Signed)
LVAD INR 

## 2021-08-09 NOTE — Patient Instructions (Signed)
Return to [facility] in 2 months. Be prepared to perform a 6 minute walk.  ?Audry Riles, PharmD will call you with INR and warfarin dosing.  ?

## 2021-08-09 NOTE — Progress Notes (Signed)
Patient presents for 6 week f/u in [facility] today alone. Reports no problems with VAD equipment or concerns with drive line. ? ?Patient says he is feeling "great" and has no limitations with activities. He has started Cardiac Rehab 3 days a week and is enjoying this "very much". ? ?Patient confirms he stopped Digoxin and potassium as instructed last visit. He says he has taken no torsemide since last visit.  ? ?Pt denies dizziness or lightheadedness except occasionally when he "bends over". Increased # of PI events noted on VAD history today. Pt reports he is drinking 50 - 60 oz fluid per day. Advised patient to drink 2 liters/fluid per day. ? ?Vital Signs:    ?Temp: 97.7  ?Doppler Pressure: 90 ?Automatc BP:  103/71 (91)  ?HR:  97    ?SPO2:  UTO ?  ?Weight: 191 lb w/ eqt ?Last weight: 182.6 lb ?  ?  ?VAD Interrogation: ?Speed: 5800 ?Flow: 5.1 ?Power: 4.8w    ?PI: 3.5 ?Alarms: none ?Events: >70 PI events yesterday ? ?Fixed speed: 5800 ?Low speed limit: 5500 ? ?Primary controller: back up battery due for replacement in 29 months ?Secondary controller:  back up battery due for replacement in 28 months  ?  ?I reviewed the LVAD parameters from today and compared the results to the patient's prior recorded data. LVAD interrogation was NEGATIVE for significant power changes, NEGATIVE for clinical alarms and STABLE for PI events/speed drops. No programming changes were made and pump is functioning within specified parameters. Pt is performing daily controller and system monitor self tests along with completing weekly and monthly maintenance for LVAD equipment. ?  ?LVAD equipment check completed and is in good working order. Back-up equipment  present. Charged back up battery and performed self-test on equipment.  ?  ?Annual Equipment Maintenance on UBC/PM was performed on 12/22.  ? ?Exit Site Care: Existing VAD dressing and anchor removed and site care performed using sterile technique. Drive line exit site cleaned with  Chlora prep applicators x 2, Rinsed w/saline, allowed to dry and Sorbaview  dressing with Silverlon applied and anchor replaced. Exit site healing and incorporated.The velour is fully implanted at exit site. Will continue weekly dressings.  Provided patient with 8 weekly kits and one box of large tegaderm dressings to cover while showering.  ? ?Provided patient with shower bag for home use. Demonstration along with written instructions and illustrated steps provided.  Patient verbalized understanding of same.  ?  ?Here are some tips for washing:  ?Avoid getting the driveline exit site dressing wet, and consider planning bathing times around exit site dressing changes. ?Use only the shower bag we gave you to shower with and don't get creative with your equipment to shower. Water and electricity do not mix and will cause your pump to stop.   ?Sit on a chair or bathing stool in the bathtub or shower stall, and use a basin of warm water and washcloth or sponge to wash. Or, wash at the sink while standing on a towel, so as not to get the floor or bath rug wet. ?To wash your hair, try using a hand-held shower wand or sprayer while standing over the kitchen sink or bathtub. ?Be sure that all floor surfaces are dry when walking around after bathing, to avoid slipping. ?Do not use powder around the exit site dressing. ?Anytime your dressing appears wet CHANGE IT! ?Drink 2 large glasses of water prior to getting in shower for first time (always hydrate before shower). ?  Caregiver needs to be accessible during first few showers. ?Call VAD Coordinator if any changes in appearance of drive line site. ? ? ? ? ?Device: N/A  (removed due to infection 12/04/20) ? ? ?BP & Labs:  ?Doppler 90 - Doppler is reflecting MAP ?  ?Hgb 13.0  - No S/S of bleeding. Specifically denies melena/BRBPR or nosebleeds. ?  ?LDH stable at 134 with established baseline of 135 - 309. Denies tea-colored urine. No power elevations noted on interrogation.  ? ?   ?Patient Instructions:  ?Return to [facility] in 2 months. Be prepared to perform a 6 minute walk.  ?Audry Riles, PharmD will call you with INR and warfarin dosing.  ? ? ?Zada Girt RN ?VAD Coordinator  ?Office: [phone]  ?24/7 Pager: [phone]  ? ? ?

## 2021-08-10 ENCOUNTER — Telehealth (HOSPITAL_COMMUNITY): Payer: Self-pay | Admitting: *Deleted

## 2021-08-10 ENCOUNTER — Encounter (HOSPITAL_COMMUNITY)
Admission: RE | Admit: 2021-08-10 | Discharge: 2021-08-10 | Disposition: A | Discharge status: Home / Self Care | Payer: Medicare Other | Source: Ambulatory Visit | Attending: Internal Medicine | Admitting: Internal Medicine

## 2021-08-10 DIAGNOSIS — I5042 Chronic combined systolic (congestive) and diastolic (congestive) heart failure: Secondary | ICD-10-CM | POA: Diagnosis not present

## 2021-08-10 DIAGNOSIS — Z95811 Presence of heart assist device: Secondary | ICD-10-CM

## 2021-08-10 DIAGNOSIS — I5023 Acute on chronic systolic (congestive) heart failure: Secondary | ICD-10-CM

## 2021-08-10 DIAGNOSIS — E876 Hypokalemia: Secondary | ICD-10-CM

## 2021-08-10 DIAGNOSIS — Z952 Presence of prosthetic heart valve: Secondary | ICD-10-CM

## 2021-08-10 MED ORDER — POTASSIUM CHLORIDE CRYS ER 20 MEQ PO TBCR: 20.0000 meq | EXTENDED_RELEASE_TABLET | Freq: Every day | ORAL | 3 refills | Status: DC

## 2021-08-10 NOTE — Telephone Encounter (Signed)
Called patient per Dr. Aundra Dubin and asked patient to start potassium 20 meq daily. Pt verbalized understanding of same. Rx sent to local pharmacy. ? ?Zada Girt RN, VAD Coordinator ?[phone] ?

## 2021-08-13 ENCOUNTER — Encounter (HOSPITAL_COMMUNITY)
Admission: RE | Admit: 2021-08-13 | Discharge: 2021-08-13 | Disposition: A | Discharge status: Home / Self Care | Payer: Medicare Other | Source: Ambulatory Visit | Attending: Internal Medicine | Admitting: Internal Medicine

## 2021-08-13 DIAGNOSIS — I5042 Chronic combined systolic (congestive) and diastolic (congestive) heart failure: Secondary | ICD-10-CM | POA: Diagnosis present

## 2021-08-13 DIAGNOSIS — Z952 Presence of prosthetic heart valve: Secondary | ICD-10-CM

## 2021-08-13 DIAGNOSIS — Z95811 Presence of heart assist device: Secondary | ICD-10-CM | POA: Diagnosis present

## 2021-08-15 ENCOUNTER — Encounter (HOSPITAL_COMMUNITY)
Admission: RE | Admit: 2021-08-15 | Discharge: 2021-08-15 | Disposition: A | Discharge status: Home / Self Care | Payer: Medicare Other | Source: Ambulatory Visit | Attending: Internal Medicine | Admitting: Internal Medicine

## 2021-08-15 DIAGNOSIS — Z952 Presence of prosthetic heart valve: Secondary | ICD-10-CM

## 2021-08-15 DIAGNOSIS — I5042 Chronic combined systolic (congestive) and diastolic (congestive) heart failure: Secondary | ICD-10-CM | POA: Diagnosis not present

## 2021-08-15 DIAGNOSIS — Z95811 Presence of heart assist device: Secondary | ICD-10-CM

## 2021-08-15 NOTE — Progress Notes (Signed)
CARDIAC REHAB PHASE 2 ? ?Reviewed home exercise with pt today. Pt is tolerating exercise well. Pt will continue to exercise on his own by walking for 30-45 minutes per session 2-4 days a week in addition to the 3 days in CRP2. Advised pt on THRR, RPE scale, hydration and temperature/humidity precautions. Reinforced S/S to stop exercise and when to call MD vs 911. Encouraged warm up cool down and stretches with exercise sessions. Pt verbalized understanding, all questions were answered and pt was given a copy to take home.  ?  ?Kirby Funk ACSM-CEP ?08/15/2021 ?4:24 PM ? ?

## 2021-08-16 ENCOUNTER — Ambulatory Visit (HOSPITAL_COMMUNITY): Payer: Self-pay | Admitting: Pharmacist

## 2021-08-16 LAB — POCT INR: INR: 2.1 (ref 2.0–3.0)

## 2021-08-16 NOTE — Progress Notes (Signed)
LVAD INR 

## 2021-08-17 ENCOUNTER — Encounter (HOSPITAL_COMMUNITY)
Admission: RE | Admit: 2021-08-17 | Discharge: 2021-08-17 | Disposition: A | Discharge status: Home / Self Care | Payer: Medicare Other | Source: Ambulatory Visit | Attending: Internal Medicine | Admitting: Internal Medicine

## 2021-08-17 DIAGNOSIS — Z952 Presence of prosthetic heart valve: Secondary | ICD-10-CM

## 2021-08-17 DIAGNOSIS — I5042 Chronic combined systolic (congestive) and diastolic (congestive) heart failure: Secondary | ICD-10-CM

## 2021-08-17 DIAGNOSIS — Z95811 Presence of heart assist device: Secondary | ICD-10-CM

## 2021-08-20 ENCOUNTER — Encounter (HOSPITAL_COMMUNITY)
Admission: RE | Admit: 2021-08-20 | Discharge: 2021-08-20 | Disposition: A | Discharge status: Home / Self Care | Payer: Medicare Other | Source: Ambulatory Visit | Attending: Internal Medicine | Admitting: Internal Medicine

## 2021-08-20 DIAGNOSIS — I2102 ST elevation (STEMI) myocardial infarction involving left anterior descending coronary artery: Secondary | ICD-10-CM

## 2021-08-20 DIAGNOSIS — Z95811 Presence of heart assist device: Secondary | ICD-10-CM

## 2021-08-20 DIAGNOSIS — I5042 Chronic combined systolic (congestive) and diastolic (congestive) heart failure: Secondary | ICD-10-CM

## 2021-08-20 DIAGNOSIS — Z952 Presence of prosthetic heart valve: Secondary | ICD-10-CM

## 2021-08-20 DIAGNOSIS — Z955 Presence of coronary angioplasty implant and graft: Secondary | ICD-10-CM

## 2021-08-22 ENCOUNTER — Encounter (HOSPITAL_COMMUNITY)
Admission: RE | Admit: 2021-08-22 | Discharge: 2021-08-22 | Disposition: A | Discharge status: Home / Self Care | Payer: Medicare Other | Source: Ambulatory Visit | Attending: Internal Medicine | Admitting: Internal Medicine

## 2021-08-22 DIAGNOSIS — I5042 Chronic combined systolic (congestive) and diastolic (congestive) heart failure: Secondary | ICD-10-CM | POA: Diagnosis not present

## 2021-08-22 DIAGNOSIS — Z952 Presence of prosthetic heart valve: Secondary | ICD-10-CM

## 2021-08-22 DIAGNOSIS — Z95811 Presence of heart assist device: Secondary | ICD-10-CM

## 2021-08-22 NOTE — Progress Notes (Signed)
Cardiac Individual Treatment Plan ? ?Patient Details  ?Name: Brandon [name] ?MRN: [id number] ?Date of Birth: Nov 07, 1955 ?Referring Provider:   ?Flowsheet Row CARDIAC REHAB PHASE II ORIENTATION from 07/26/2021 in [place]  ?Referring Provider Dr. Glori Bickers, MD  ? ?  ? ? ?Initial Encounter Date:  ?Flowsheet Row CARDIAC REHAB PHASE II ORIENTATION from 07/26/2021 in [place]  ?Date 07/26/21  ? ?  ? ? ?Visit Diagnosis: Heart failure, systolic and diastolic, chronic ([place]) ? ?04/06/21 LVAD HM3 ? ?04/06/21 S/P AVR (aortic valve replacement) ? ?Patient's Home Medications on Admission: ? ?Current Outpatient Medications:  ?  acetaminophen (TYLENOL) 500 MG tablet, Take 1,000 mg by mouth every 6 (six) hours as needed (pain.)., Disp: , Rfl:  ?  atorvastatin (LIPITOR) 80 MG tablet, TAKE 1 TABLET BY MOUTH ONCE DAILY AT 6PM. (Patient taking differently: Take 80 mg by mouth every evening.), Disp: 90 tablet, Rfl: 3 ?  Carboxymethylcellulose Sodium (ARTIFICIAL TEARS OP), Place 1 drop into both eyes daily as needed (for dryness or irritation)., Disp: , Rfl:  ?  eplerenone (INSPRA) 50 MG tablet, Take 0.5 tablets (25 mg total) by mouth in the morning., Disp: 30 tablet, Rfl: 6 ?  ezetimibe (ZETIA) 10 MG tablet, Take 1 tablet (10 mg total) by mouth daily., Disp: 90 tablet, Rfl: 3 ?  gabapentin (NEURONTIN) 300 MG capsule, Take 1 capsule (300 mg total) by mouth 3 (three) times daily. (Patient taking differently: Take 100 mg by mouth 3 (three) times daily.), Disp: 90 capsule, Rfl: 5 ?  losartan (COZAAR) 25 MG tablet, Take 1/2 tablet (12.5 mg total) by mouth daily., Disp: 30 tablet, Rfl: 5 ?  melatonin 5 MG TABS, Take 1 tablet (5 mg total) by mouth at bedtime. (Patient taking differently: Take 10 mg by mouth 3 (three) times a week.), Disp: 30 tablet, Rfl: 6 ?  nystatin ointment (MYCOSTATIN), Apply 1 application topically 2 (two) times daily., Disp: 30 g, Rfl: 3 ?  ondansetron  (ZOFRAN) 4 MG tablet, Take 1 tablet (4 mg total) by mouth every 8 (eight) hours as needed for nausea or vomiting. (Patient not taking: Reported on 08/09/2021), Disp: 20 tablet, Rfl: 3 ?  pantoprazole (PROTONIX) 40 MG tablet, Take 1 tablet (40 mg total) by mouth daily., Disp: 30 tablet, Rfl: 6 ?  potassium chloride SA (KLOR-CON M) 20 MEQ tablet, Take 1 tablet (20 mEq total) by mouth daily., Disp: 90 tablet, Rfl: 3 ?  sertraline (ZOLOFT) 100 MG tablet, Take 1 tablet (100 mg total) by mouth daily., Disp: 30 tablet, Rfl: 2 ?  tamsulosin (FLOMAX) 0.4 MG CAPS capsule, TAKE 1 CAPSULE(0.4 MG) BY MOUTH DAILY (Patient taking differently: Take 0.4 mg by mouth daily.), Disp: 90 capsule, Rfl: 1 ?  torsemide (DEMADEX) 20 MG tablet, Take 1 tablet (20 mg total) by mouth as needed. (Patient not taking: Reported on 08/09/2021), Disp: 30 tablet, Rfl: 5 ?  traMADol (ULTRAM) 50 MG tablet, Take 1 tablet (50 mg total) by mouth every 6 (six) hours as needed for moderate pain. (Patient taking differently: Take 100 mg by mouth 2 (two) times daily as needed for moderate pain.), Disp: 30 tablet, Rfl: 0 ?  warfarin (COUMADIN) 2.5 MG tablet, Take 2.5 mg (1 tablet) every Monday/Friday and 5 mg (2 tablets) all other days or as instructed by LVAD clinic. (Patient taking differently: Take 5 mg by mouth at bedtime. or as instructed by LVAD clinic.), Disp: 75 tablet, Rfl: 11 ? ?Past Medical  History: ?Past Medical History:  ?Diagnosis Date  ? AICD (automatic cardioverter/defibrillator) present 08/31/2019  ? Ankylosing spondylitis ([place])   ? Arthritis   ? BENIGN PROSTATIC HYPERTROPHY 06/07/2008  ? CHF (congestive heart failure) ([place])   ? COLONIC POLYPS, HX OF 06/07/2008  ? Coronary artery disease   ? Depression   ? H/O hiatal hernia   ? Heart failure ([place])   ? HYPERLIPIDEMIA 06/07/2008  ? HYPERTENSION 06/07/2008  ? Myocardial infarction [facility]) 2005  ? NSTEMI, s/p LAD stent  ? NEPHROLITHIASIS, HX OF 06/07/2008  ? STEMI (ST elevation myocardial infarction)  ([place]) 04/27/2019  ? ? ?Tobacco Use: ?Social History  ? ?Tobacco Use  ?Smoking Status Former  ? Packs/day: 1.00  ? Years: 40.00  ? Pack years: 40.00  ? Types: Cigarettes  ? Quit date: 07/26/2019  ? Years since quitting: 2.0  ?Smokeless Tobacco Never  ?Tobacco Comments  ? 1-1.5 pks per year x 40-45 yrs  ? ? ?Labs: ?Review Flowsheet   ? ?  ?  Latest Ref Rng & Units 04/25/2021 04/26/2021 04/27/2021 04/28/2021  ?Labs for ITP Cardiac and Pulmonary Rehab  ?O2 Saturation % 69.5   59.5   50.9   36.8    ? ?  05/04/2021  ?Labs for ITP Cardiac and Pulmonary Rehab  ?O2 Saturation 51.2    ?  ? ? Multiple values from one day are sorted in reverse-chronological order  ?  ?  ? ? ?Capillary Blood Glucose: ?Lab Results  ?Component Value Date  ? GLUCAP 100 (H) 05/09/2021  ? GLUCAP 84 05/09/2021  ? GLUCAP 122 (H) 05/08/2021  ? GLUCAP 96 05/08/2021  ? GLUCAP 106 (H) 05/08/2021  ? ? ? ?Exercise Target Goals: ?Exercise Program Goal: ?Individual exercise prescription set using results from initial 6 min walk test and THRR while considering  patient?s activity barriers and safety.  ? ?Exercise Prescription Goal: ?Starting with aerobic activity 30 plus minutes a day, 3 days per week for initial exercise prescription. Provide home exercise prescription and guidelines that participant acknowledges understanding prior to discharge. ? ?Activity Barriers & Risk Stratification: ? Activity Barriers & Cardiac Risk Stratification - 07/26/21 1152   ? ?  ? Activity Barriers & Cardiac Risk Stratification  ? Activity Barriers Neck/Spine Problems;Back Problems;Joint Problems;Muscular Weakness;Shortness of Breath;Balance Concerns   ? Cardiac Risk Stratification High   ? ?  ?  ? ?  ? ? ?6 Minute Walk: ? 6 Minute Walk   ? ? [name] Name 07/26/21 1144  ?  ?  ?  ? 6 Minute Walk  ? Phase Initial    ? Distance 800 feet    ? Distance % Change 7.92 %    ? Walk Time 6 minutes    ? # of Rest Breaks 2  Sitting rest 3:00-3:30 for high HR, standing rest 5:50-6:00 due to pain     ? MPH 1.52    ? METS 2.26    ? RPE 12    ? Perceived Dyspnea  2    ? VO2 Peak 7.92    ? Symptoms Yes (comment)    ? Comments Bilate hip pain 5/10 and bilate leg pain 5/10. DYS 2 SOB while walking    ? Resting HR 95 bpm    ? Resting BP 76/0  MAP    ? Resting Oxygen Saturation  96 %    ? Exercise Oxygen Saturation  during 6 min walk 98 %    ? Max Ex. HR 135 bpm    ?  Max Ex. BP 84/0  MAP    ? 2 Minute Post BP 74/0  MAP    ? ?  ?  ? ?  ? ? ?Oxygen Initial Assessment: ? ? ?Oxygen Re-Evaluation: ? ? ?Oxygen Discharge (Final Oxygen Re-Evaluation): ? ? ?Initial Exercise Prescription: ? Initial Exercise Prescription - 07/26/21 1100   ? ?  ? Date of Initial Exercise RX and Referring Provider  ? Date 07/26/21   ? Referring Provider Dr. Glori Bickers, MD   ? Expected Discharge Date 09/21/21   ?  ? NuStep  ? Level 1   ? SPM 60   ? Minutes 25   ? METs 1.7   ?  ? Prescription Details  ? Frequency (times per week) 3   ? Duration Progress to 30 minutes of continuous aerobic without signs/symptoms of physical distress   ?  ? Intensity  ? THRR 40-80% of Max Heartrate 61-122   ? Ratings of Perceived Exertion 11-13   ? Perceived Dyspnea 0-4   ?  ? Progression  ? Progression Continue progressive overload as per policy without signs/symptoms or physical distress.   ?  ? Resistance Training  ? Training Prescription Yes   ? Weight 3   ? Reps 10-15   ? ?  ?  ? ?  ? ? ?Perform Capillary Blood Glucose checks as needed. ? ?Exercise Prescription Changes: ? ? Exercise Prescription Changes   ? ? [place] Name 07/30/21 1652 08/15/21 1614  ?  ?  ?  ?  ? Response to Exercise  ? Blood Pressure (Admit) 80/0  MAP 90/0  MAP     ? Blood Pressure (Exercise) 84/0  MAP 84/0  MAP     ? Blood Pressure (Exit) 78/0  MAP 72/0  MAP     ? Heart Rate (Admit) 93 bpm 100 bpm     ? Heart Rate (Exercise) 124 bpm 119 bpm     ? Heart Rate (Exit) 97 bpm 100 bpm     ? Rating of Perceived Exertion (Exercise) 11 12     ? Perceived Dyspnea (Exercise) 0 0     ? Symptoms 0 0      ? Comments Pt first day in the CRP2 program Reviewed MET's, goals and home ExRx     ? Duration Progress to 30 minutes of  aerobic without signs/symptoms of physical distress Progress to 30 minutes o

## 2021-08-23 ENCOUNTER — Ambulatory Visit (HOSPITAL_COMMUNITY): Payer: Self-pay | Admitting: Pharmacist

## 2021-08-23 LAB — POCT INR: INR: 1.9 — AB (ref 2.0–3.0)

## 2021-08-23 NOTE — Progress Notes (Signed)
LVAD INR 

## 2021-08-24 ENCOUNTER — Encounter (HOSPITAL_COMMUNITY)
Admission: RE | Admit: 2021-08-24 | Discharge: 2021-08-24 | Disposition: A | Discharge status: Home / Self Care | Payer: Medicare Other | Source: Ambulatory Visit | Attending: Internal Medicine | Admitting: Internal Medicine

## 2021-08-24 DIAGNOSIS — Z952 Presence of prosthetic heart valve: Secondary | ICD-10-CM

## 2021-08-24 DIAGNOSIS — I5042 Chronic combined systolic (congestive) and diastolic (congestive) heart failure: Secondary | ICD-10-CM

## 2021-08-24 DIAGNOSIS — Z95811 Presence of heart assist device: Secondary | ICD-10-CM

## 2021-08-24 NOTE — Progress Notes (Signed)
Brandon [name] said he experienced some burning in his chest that lasted about 15 minutes. Map 90. Rhythm regular. Tanda Rockers  LVAD coordinator paged and notified. Judson Roch spoke with Collier Salina over the phone. Will continue to monitor throughout the [place] RN BSN  ?

## 2021-08-27 ENCOUNTER — Encounter (HOSPITAL_COMMUNITY)
Admission: RE | Admit: 2021-08-27 | Discharge: 2021-08-27 | Disposition: A | Discharge status: Home / Self Care | Payer: Medicare Other | Source: Ambulatory Visit | Attending: Internal Medicine | Admitting: Internal Medicine

## 2021-08-27 DIAGNOSIS — I5042 Chronic combined systolic (congestive) and diastolic (congestive) heart failure: Secondary | ICD-10-CM

## 2021-08-27 DIAGNOSIS — Z955 Presence of coronary angioplasty implant and graft: Secondary | ICD-10-CM

## 2021-08-27 DIAGNOSIS — Z952 Presence of prosthetic heart valve: Secondary | ICD-10-CM

## 2021-08-27 DIAGNOSIS — Z95811 Presence of heart assist device: Secondary | ICD-10-CM

## 2021-08-28 ENCOUNTER — Telehealth (HOSPITAL_COMMUNITY): Payer: Self-pay | Admitting: Unknown Physician Specialty

## 2021-08-28 ENCOUNTER — Encounter (INDEPENDENT_AMBULATORY_CARE_PROVIDER_SITE_OTHER): Payer: Medicare Other | Admitting: Ophthalmology

## 2021-08-28 DIAGNOSIS — H35712 Central serous chorioretinopathy, left eye: Secondary | ICD-10-CM | POA: Diagnosis not present

## 2021-08-28 DIAGNOSIS — H35033 Hypertensive retinopathy, bilateral: Secondary | ICD-10-CM

## 2021-08-28 DIAGNOSIS — I10 Essential (primary) hypertension: Secondary | ICD-10-CM

## 2021-08-28 DIAGNOSIS — H43813 Vitreous degeneration, bilateral: Secondary | ICD-10-CM

## 2021-08-28 DIAGNOSIS — H353132 Nonexudative age-related macular degeneration, bilateral, intermediate dry stage: Secondary | ICD-10-CM | POA: Diagnosis not present

## 2021-08-28 MED ORDER — EPLERENONE 50 MG PO TABS: 50.0000 mg | ORAL_TABLET | Freq: Every morning | ORAL | 6 refills | Status: DC

## 2021-08-28 NOTE — Telephone Encounter (Signed)
Received call from pt this morning stating that his eye doctor would like to increase his eplerenone to 50 mg from 25 mg due to increased fluid in the pts retina. D/w Dr [name] who is ok with this change. I have updated the pts med list. Pt informed that Dr [name] does not object to the increase in eplerenone. ? ?Tanda Rockers RN, BSN ?VAD Coordinator ?24/7 Pager [phone] ? ?

## 2021-08-29 ENCOUNTER — Encounter (HOSPITAL_COMMUNITY)
Admission: RE | Admit: 2021-08-29 | Discharge: 2021-08-29 | Disposition: A | Discharge status: Home / Self Care | Payer: Medicare Other | Source: Ambulatory Visit | Attending: Internal Medicine | Admitting: Internal Medicine

## 2021-08-29 DIAGNOSIS — Z95811 Presence of heart assist device: Secondary | ICD-10-CM

## 2021-08-29 DIAGNOSIS — Z952 Presence of prosthetic heart valve: Secondary | ICD-10-CM

## 2021-08-29 DIAGNOSIS — I5042 Chronic combined systolic (congestive) and diastolic (congestive) heart failure: Secondary | ICD-10-CM

## 2021-08-29 DIAGNOSIS — I2102 ST elevation (STEMI) myocardial infarction involving left anterior descending coronary artery: Secondary | ICD-10-CM

## 2021-08-29 DIAGNOSIS — Z955 Presence of coronary angioplasty implant and graft: Secondary | ICD-10-CM

## 2021-08-30 ENCOUNTER — Ambulatory Visit (HOSPITAL_COMMUNITY): Payer: Self-pay | Admitting: Pharmacist

## 2021-08-30 ENCOUNTER — Other Ambulatory Visit (HOSPITAL_COMMUNITY): Payer: Self-pay

## 2021-08-30 LAB — POCT INR: INR: 1.9 — AB (ref 2.0–3.0)

## 2021-08-30 NOTE — Progress Notes (Signed)
LVAD INR 

## 2021-08-31 ENCOUNTER — Other Ambulatory Visit (HOSPITAL_COMMUNITY): Payer: Self-pay | Admitting: Internal Medicine

## 2021-08-31 ENCOUNTER — Other Ambulatory Visit (HOSPITAL_COMMUNITY): Payer: Self-pay

## 2021-08-31 ENCOUNTER — Encounter (HOSPITAL_COMMUNITY)
Admission: RE | Admit: 2021-08-31 | Discharge: 2021-08-31 | Disposition: A | Discharge status: Home / Self Care | Payer: Medicare Other | Source: Ambulatory Visit | Attending: Internal Medicine | Admitting: Internal Medicine

## 2021-08-31 DIAGNOSIS — I5042 Chronic combined systolic (congestive) and diastolic (congestive) heart failure: Secondary | ICD-10-CM

## 2021-08-31 DIAGNOSIS — Z955 Presence of coronary angioplasty implant and graft: Secondary | ICD-10-CM

## 2021-08-31 DIAGNOSIS — Z952 Presence of prosthetic heart valve: Secondary | ICD-10-CM

## 2021-08-31 DIAGNOSIS — I2102 ST elevation (STEMI) myocardial infarction involving left anterior descending coronary artery: Secondary | ICD-10-CM

## 2021-08-31 DIAGNOSIS — Z95811 Presence of heart assist device: Secondary | ICD-10-CM

## 2021-09-03 ENCOUNTER — Encounter (HOSPITAL_COMMUNITY)
Admission: RE | Admit: 2021-09-03 | Discharge: 2021-09-03 | Disposition: A | Discharge status: Home / Self Care | Payer: Medicare Other | Source: Ambulatory Visit | Attending: Internal Medicine | Admitting: Internal Medicine

## 2021-09-03 DIAGNOSIS — Z955 Presence of coronary angioplasty implant and graft: Secondary | ICD-10-CM

## 2021-09-03 DIAGNOSIS — Z952 Presence of prosthetic heart valve: Secondary | ICD-10-CM

## 2021-09-03 DIAGNOSIS — I5042 Chronic combined systolic (congestive) and diastolic (congestive) heart failure: Secondary | ICD-10-CM

## 2021-09-03 DIAGNOSIS — I2102 ST elevation (STEMI) myocardial infarction involving left anterior descending coronary artery: Secondary | ICD-10-CM

## 2021-09-03 DIAGNOSIS — Z95811 Presence of heart assist device: Secondary | ICD-10-CM

## 2021-09-03 NOTE — Progress Notes (Signed)
Mild micropapular rash noted near anchor and dressing. Patient asymptomatic. Tanda Rockers LVAD coordinator called and notified. Judson Roch says she will evaluate Brandon [name] tomorrow when he comes for the LVAD luncheon.Barnet Pall, RN,BSN ?09/03/2021 3:01 PM  ?

## 2021-09-04 ENCOUNTER — Ambulatory Visit (HOSPITAL_COMMUNITY)
Admission: RE | Admit: 2021-09-04 | Discharge: 2021-09-04 | Disposition: A | Discharge status: Home / Self Care | Payer: Medicare Other | Source: Ambulatory Visit | Attending: Cardiology | Admitting: Cardiology

## 2021-09-04 DIAGNOSIS — Z95811 Presence of heart assist device: Secondary | ICD-10-CM | POA: Diagnosis present

## 2021-09-04 NOTE — Progress Notes (Signed)
Pt presents to clinic today after CR called the VAD clinic stating pt had a "rash" at driveline site. ? ?Site accessed. Dressing is CDI. No drainage. There is very slight red papillary rash around his dressing area. The pt denies itching and pain in the area.  ? ?VAD coordinator spoke with [name] - pts caregiver. [name] was instructed to rinse the CHG off the skin after using and allow skin to dry. [name] verbalized understanding. Pt was instructed that if rash gets worse or becomes bothersome to call the VAD clinic.  ? ?Tanda Rockers RN, BSN ?VAD Coordinator ?24/7 Pager [phone] ? ?

## 2021-09-05 ENCOUNTER — Encounter (HOSPITAL_COMMUNITY)
Admission: RE | Admit: 2021-09-05 | Discharge: 2021-09-05 | Disposition: A | Discharge status: Home / Self Care | Payer: Medicare Other | Source: Ambulatory Visit | Attending: Internal Medicine | Admitting: Internal Medicine

## 2021-09-05 DIAGNOSIS — Z95811 Presence of heart assist device: Secondary | ICD-10-CM

## 2021-09-05 DIAGNOSIS — I5042 Chronic combined systolic (congestive) and diastolic (congestive) heart failure: Secondary | ICD-10-CM

## 2021-09-05 DIAGNOSIS — Z952 Presence of prosthetic heart valve: Secondary | ICD-10-CM

## 2021-09-06 ENCOUNTER — Ambulatory Visit (HOSPITAL_COMMUNITY): Payer: Self-pay | Admitting: Pharmacist

## 2021-09-06 LAB — POCT INR: INR: 1.9 — AB (ref 2.0–3.0)

## 2021-09-06 NOTE — Progress Notes (Signed)
LVAD INR 

## 2021-09-07 ENCOUNTER — Encounter (HOSPITAL_COMMUNITY)
Admission: RE | Admit: 2021-09-07 | Discharge: 2021-09-07 | Disposition: A | Discharge status: Home / Self Care | Payer: Medicare Other | Source: Ambulatory Visit | Attending: Internal Medicine | Admitting: Internal Medicine

## 2021-09-07 DIAGNOSIS — Z955 Presence of coronary angioplasty implant and graft: Secondary | ICD-10-CM

## 2021-09-07 DIAGNOSIS — I5042 Chronic combined systolic (congestive) and diastolic (congestive) heart failure: Secondary | ICD-10-CM | POA: Diagnosis not present

## 2021-09-07 DIAGNOSIS — Z952 Presence of prosthetic heart valve: Secondary | ICD-10-CM

## 2021-09-07 DIAGNOSIS — I2102 ST elevation (STEMI) myocardial infarction involving left anterior descending coronary artery: Secondary | ICD-10-CM

## 2021-09-07 DIAGNOSIS — Z95811 Presence of heart assist device: Secondary | ICD-10-CM

## 2021-09-08 ENCOUNTER — Other Ambulatory Visit: Payer: Self-pay | Admitting: Family Medicine

## 2021-09-10 ENCOUNTER — Other Ambulatory Visit: Payer: Self-pay | Admitting: Family Medicine

## 2021-09-10 ENCOUNTER — Encounter (HOSPITAL_COMMUNITY)
Admission: RE | Admit: 2021-09-10 | Discharge: 2021-09-10 | Disposition: A | Discharge status: Home / Self Care | Payer: Medicare Other | Source: Ambulatory Visit | Attending: Internal Medicine | Admitting: Internal Medicine

## 2021-09-10 DIAGNOSIS — I5042 Chronic combined systolic (congestive) and diastolic (congestive) heart failure: Secondary | ICD-10-CM | POA: Insufficient documentation

## 2021-09-10 DIAGNOSIS — I252 Old myocardial infarction: Secondary | ICD-10-CM | POA: Insufficient documentation

## 2021-09-10 DIAGNOSIS — Z95811 Presence of heart assist device: Secondary | ICD-10-CM | POA: Diagnosis present

## 2021-09-10 DIAGNOSIS — Z955 Presence of coronary angioplasty implant and graft: Secondary | ICD-10-CM | POA: Diagnosis not present

## 2021-09-10 DIAGNOSIS — Z952 Presence of prosthetic heart valve: Secondary | ICD-10-CM | POA: Diagnosis present

## 2021-09-12 ENCOUNTER — Encounter: Payer: Self-pay | Admitting: Family Medicine

## 2021-09-12 ENCOUNTER — Encounter (HOSPITAL_COMMUNITY)
Admission: RE | Admit: 2021-09-12 | Discharge: 2021-09-12 | Disposition: A | Discharge status: Home / Self Care | Payer: Medicare Other | Source: Ambulatory Visit | Attending: Internal Medicine | Admitting: Internal Medicine

## 2021-09-12 ENCOUNTER — Ambulatory Visit (INDEPENDENT_AMBULATORY_CARE_PROVIDER_SITE_OTHER): Payer: Medicare Other | Admitting: Family Medicine

## 2021-09-12 VITALS — BP 114/85 | HR 84 | Temp 98.1°F | Wt 194.2 lb

## 2021-09-12 DIAGNOSIS — I2510 Atherosclerotic heart disease of native coronary artery without angina pectoris: Secondary | ICD-10-CM

## 2021-09-12 DIAGNOSIS — N400 Enlarged prostate without lower urinary tract symptoms: Secondary | ICD-10-CM

## 2021-09-12 DIAGNOSIS — Z952 Presence of prosthetic heart valve: Secondary | ICD-10-CM

## 2021-09-12 DIAGNOSIS — Z7901 Long term (current) use of anticoagulants: Secondary | ICD-10-CM

## 2021-09-12 DIAGNOSIS — Z95811 Presence of heart assist device: Secondary | ICD-10-CM

## 2021-09-12 DIAGNOSIS — I2102 ST elevation (STEMI) myocardial infarction involving left anterior descending coronary artery: Secondary | ICD-10-CM

## 2021-09-12 DIAGNOSIS — Z955 Presence of coronary angioplasty implant and graft: Secondary | ICD-10-CM

## 2021-09-12 DIAGNOSIS — I5042 Chronic combined systolic (congestive) and diastolic (congestive) heart failure: Secondary | ICD-10-CM | POA: Diagnosis not present

## 2021-09-12 DIAGNOSIS — I10 Essential (primary) hypertension: Secondary | ICD-10-CM

## 2021-09-12 MED ORDER — TAMSULOSIN HCL 0.4 MG PO CAPS: 0.4000 mg | ORAL_CAPSULE | Freq: Every day | ORAL | 3 refills | Status: DC

## 2021-09-12 NOTE — Progress Notes (Signed)
Subjective:  ? ? Patient ID: Brandon [name], male    DOB: 1955/10/08, 66 y.o.   MRN: [id number] ? ?Chief Complaint  ?Patient presents with  ? Medication Refill  ?  Needs new Rx for meds. Flomax  ? ? ?HPI ?Pt is a 66 yo male with pmh sig for HTN, combined CHF, h/o CAD, h/o anterior wall MI, AV endocarditis, ischemic cardiomyopathy s/p LVAD on chronic anticoagulation,BPH, h/o cholelithiasis, ILD, emphysema, alkalizing spondylitis, h/o depression, HLD, former smoker, was seen today for f/u.  Pt states he is doing well considering everything that has gone on.   ? ?Pt was hospitalized  10/26-12/28/22 for sepsis, started on IV abx which were stopped after negative cx.  Also had acute resp failure 2/2 CHF and pna treated with abx.  IV diuretics started for worsened HF.  PICC line placed for CVP and CO-OX which was low requiring milrinone.  Pt failed milrinone wean.  [name] felt pt was not a txp candidate, was LVAD candidate.  Placement of HM3 VAD and aortic valve replacement with 25 mm Edwards Inspiris Resilia valce on 04/06/21.  Surg path with possible acute aortic valve endocarditis.  Bcx recollected.  Tissue sent to [facility].  ID consulted, IV ceftriaxone started to cover Strep gordonae that was previously isolated.  Pt completed 6 wks IV abx ending 05/22/21 with HH.  Pt diuresed with IV lasix and metolaxone for post op vol overload, then po torsemide.  Course complicated by AF with RVR conversted to NSR with IV amio, hyponatremia requiring tolvaptan, iron def anemia s/p 1 u pRBCs.  B/l pleural effusions s/p L thoracentesis on 12/8 and 12/12 and R on 12/9.  On 12/16 patient developed rigors.  No leukocytosis.  Vanc added. BCx negative.  Tested positive for COVID 12/18, treated with remdesivir x4 days.  Patient was stable for discharge 05/09/22 on warfarin. ? ?Pt in Cardiac rehab.  Plans to continue exercising through the program at [facility].  Pt denies LE edema, SOB, CP, dizziness.  Changed pharmacies, requesting refill on  flomax.  Denies current urinary symptoms while on med. Pt is a former smoker, quit 2 yrs ago. ? ?Past Medical History:  ?Diagnosis Date  ? AICD (automatic cardioverter/defibrillator) present 08/31/2019  ? Ankylosing spondylitis ([place])   ? Arthritis   ? BENIGN PROSTATIC HYPERTROPHY 06/07/2008  ? CHF (congestive heart failure) ([place])   ? COLONIC POLYPS, HX OF 06/07/2008  ? Coronary artery disease   ? Depression   ? H/O hiatal hernia   ? Heart failure ([place])   ? HYPERLIPIDEMIA 06/07/2008  ? HYPERTENSION 06/07/2008  ? Myocardial infarction [facility]) 2005  ? NSTEMI, s/p LAD stent  ? NEPHROLITHIASIS, HX OF 06/07/2008  ? STEMI (ST elevation myocardial infarction) ([place]) 04/27/2019  ? ? ?No Known Allergies ? ?ROS ?General: Denies fever, chills, night sweats, changes in weight, changes in appetite ?HEENT: Denies headaches, ear pain, changes in vision, rhinorrhea, sore throat ?CV: Denies CP, palpitations, SOB, orthopnea ?Pulm: Denies SOB, cough, wheezing ?GI: Denies abdominal pain, nausea, vomiting, diarrhea, constipation ?GU: Denies dysuria, hematuria, frequency ?Msk: Denies muscle cramps, joint pains ?Neuro: Denies weakness, numbness, tingling ?Skin: Denies rashes, bruising ?Psych: Denies depression, anxiety, hallucinations ? ?   ?Objective:  ?  ?Blood pressure 114/85, pulse 84, temperature 98.1 ?F (36.7 ?C), temperature source Oral, weight 194 lb 3.2 oz (88.1 kg), SpO2 99 %. ? ?Gen. Pleasant, well-nourished, in no distress, normal affect   ?HEENT: [place]/AT, face symmetric, conjunctiva clear, no scleral icterus, PERRLA, EOMI, nares  patent without drainage, pharynx without erythema or exudate.  TMs normal b/l. ?Lungs: no accessory muscle use, CTAB, no wheezes or rales ?Cardiovascular: mechanical hum of LVAD, no peripheral edema ?Musculoskeletal: No deformities, no cyanosis or clubbing, normal tone ?Neuro:  A&Ox3, CN II-XII intact, normal gait ?Skin:  Warm, no lesions/ rash ? ?Wt Readings from Last 3 Encounters:  ?09/12/21 194 lb 3.2 oz  (88.1 kg)  ?08/09/21 191 lb (86.6 kg)  ?07/26/21 187 lb 13.3 oz (85.2 kg)  ? ? ?Lab Results  ?Component Value Date  ? WBC 5.9 08/09/2021  ? HGB 13.0 08/09/2021  ? HCT 41.6 08/09/2021  ? PLT 196 08/09/2021  ? GLUCOSE 101 (H) 08/09/2021  ? CHOL 122 02/06/2021  ? TRIG 124 02/06/2021  ? HDL 37 (L) 02/06/2021  ? [place] 60 02/06/2021  ? ALT 17 06/28/2021  ? AST 20 06/28/2021  ? NA 138 08/09/2021  ? K 3.2 (L) 08/09/2021  ? CL 106 08/09/2021  ? CREATININE 0.86 08/09/2021  ? BUN 15 08/09/2021  ? CO2 25 08/09/2021  ? TSH 0.917 02/06/2021  ? PSA 0.96 04/15/2019  ? INR 1.9 (A) 09/06/2021  ? HGBA1C 6.0 (H) 04/07/2021  ? ? ?Assessment/Plan: ? ?Benign prostatic hyperplasia without lower urinary tract symptoms  ?-stable ?-continue flomax ?- Plan: tamsulosin (FLOMAX) 0.4 MG CAPS capsule ? ?LVAD (left ventricular assist device) present ([place]) ?-stable ?-continue Cardiac rehab ?-continue f/u with Cards ? ?Essential hypertension ?-controlled ?-continue current meds losartan 12.5 mg, eplerenone 50 daily ?-lifestyle modifications ?-continue smoking cessation. ? ?Chronic anticoagulation ?-Indefinite anticoagulation s/p AVR and LVAD placement ?-on Coumadin 5 mg daily ?-INR goal 2.0-2.5 ?-continue f/u with LVAD clinic. ? ?S/P AVR (aortic valve replacement) ?-continue coumadin ? ?Chronic combined systolic and diastolic heart failure ([place]) ?-Euvolemic ?-Echo 03/20/2021 pre-LVAD evaluation: No LV thrombus.  Apical akinesis/mild dyskinesis.  Severe hypokinesis of inferior wall.  Estimated EF 20-25% LV severely decreased function with regional wall motion abnormalities.  Mild eccentric left VH.  Grade 2 diastolic dysfunction.  Elevated LA pressure.  LA severely dilated.  Mitral valve is degenerative.  Mild to moderate mitral valve regurg.  No evidence of mitral stenosis.  Moderate calcification of the aortic valve.  Moderate thickening of aortic valve moderate AV regurg. ?-Echo 04/27/2021: LV function is severely reduced.  LVAD inflow cannula  visualized.  The windows are poor.  Small pericardial effusion.  No cardiac tamponade. ?-Continue cardiac rehab ?-Continue current medications including Eplerenone 50 mg, losartan 12.5 mg daily, torsemide 20 mg as needed ?-continue f/u with Cardiology ? ?F/u prn in 3-6 months. ? ?Pt wished a happy early birthday ([birth year]). ? ?Grier Mitts, MD ?

## 2021-09-13 ENCOUNTER — Ambulatory Visit (HOSPITAL_COMMUNITY): Payer: Self-pay | Admitting: Pharmacist

## 2021-09-13 LAB — POCT INR: INR: 1.8 — AB (ref 2.0–3.0)

## 2021-09-13 NOTE — Progress Notes (Signed)
LVAD INR 

## 2021-09-14 ENCOUNTER — Encounter (HOSPITAL_COMMUNITY)
Admission: RE | Admit: 2021-09-14 | Discharge: 2021-09-14 | Disposition: A | Discharge status: Home / Self Care | Payer: Medicare Other | Source: Ambulatory Visit | Attending: Internal Medicine | Admitting: Internal Medicine

## 2021-09-14 DIAGNOSIS — Z955 Presence of coronary angioplasty implant and graft: Secondary | ICD-10-CM

## 2021-09-14 DIAGNOSIS — I5042 Chronic combined systolic (congestive) and diastolic (congestive) heart failure: Secondary | ICD-10-CM

## 2021-09-14 DIAGNOSIS — Z95811 Presence of heart assist device: Secondary | ICD-10-CM

## 2021-09-14 DIAGNOSIS — Z952 Presence of prosthetic heart valve: Secondary | ICD-10-CM

## 2021-09-17 ENCOUNTER — Encounter (HOSPITAL_COMMUNITY)
Admission: RE | Admit: 2021-09-17 | Discharge: 2021-09-17 | Disposition: A | Discharge status: Home / Self Care | Payer: Medicare Other | Source: Ambulatory Visit | Attending: Internal Medicine | Admitting: Internal Medicine

## 2021-09-17 VITALS — Ht 71.75 in | Wt 197.8 lb

## 2021-09-17 DIAGNOSIS — Z952 Presence of prosthetic heart valve: Secondary | ICD-10-CM

## 2021-09-17 DIAGNOSIS — Z95811 Presence of heart assist device: Secondary | ICD-10-CM

## 2021-09-17 DIAGNOSIS — I5042 Chronic combined systolic (congestive) and diastolic (congestive) heart failure: Secondary | ICD-10-CM

## 2021-09-19 ENCOUNTER — Encounter (HOSPITAL_COMMUNITY)
Admission: RE | Admit: 2021-09-19 | Discharge: 2021-09-19 | Disposition: A | Discharge status: Home / Self Care | Payer: Medicare Other | Source: Ambulatory Visit | Attending: Internal Medicine | Admitting: Internal Medicine

## 2021-09-19 ENCOUNTER — Other Ambulatory Visit: Payer: Self-pay | Admitting: Infectious Diseases

## 2021-09-19 DIAGNOSIS — I2102 ST elevation (STEMI) myocardial infarction involving left anterior descending coronary artery: Secondary | ICD-10-CM

## 2021-09-19 DIAGNOSIS — I5042 Chronic combined systolic (congestive) and diastolic (congestive) heart failure: Secondary | ICD-10-CM

## 2021-09-19 DIAGNOSIS — Z95811 Presence of heart assist device: Secondary | ICD-10-CM

## 2021-09-19 DIAGNOSIS — Z952 Presence of prosthetic heart valve: Secondary | ICD-10-CM

## 2021-09-19 DIAGNOSIS — Z955 Presence of coronary angioplasty implant and graft: Secondary | ICD-10-CM

## 2021-09-19 NOTE — Progress Notes (Signed)
Cardiac Individual Treatment Plan ? ?Patient Details  ?Name: Brandon [name] ?MRN: [id number] ?Date of Birth: Oct 09, 1955 ?Referring Provider:   ?Flowsheet Row CARDIAC REHAB PHASE II ORIENTATION from 07/26/2021 in [facility]  ?Referring Provider Dr. Glori Bickers, MD  ? ?  ? ? ?Initial Encounter Date:  ?Flowsheet Row CARDIAC REHAB PHASE II ORIENTATION from 07/26/2021 in [facility]  ?Date 07/26/21  ? ?  ? ? ?Visit Diagnosis: Heart failure, systolic and diastolic, chronic ([place]) ? ?04/06/21 LVAD HM3 ? ?04/06/21 S/P AVR (aortic valve replacement) ? ?Patient's Home Medications on Admission: ? ?Current Outpatient Medications:  ?  acetaminophen (TYLENOL) 500 MG tablet, Take 1,000 mg by mouth every 6 (six) hours as needed (pain.)., Disp: , Rfl:  ?  amoxicillin (AMOXIL) 500 MG capsule, TAKE 4 CAPSULES (2,000 MG TOTAL) BY MOUTH ONCE FOR 1 DOSE. 30-60 MINUTES BEFORE DENTAL PROCEDURE, Disp: 4 capsule, Rfl: 0 ?  atorvastatin (LIPITOR) 80 MG tablet, TAKE 1 TABLET BY MOUTH ONCE DAILY AT 6PM., Disp: 90 tablet, Rfl: 3 ?  Carboxymethylcellulose Sodium (ARTIFICIAL TEARS OP), Place 1 drop into both eyes daily as needed (for dryness or irritation)., Disp: , Rfl:  ?  eplerenone (INSPRA) 50 MG tablet, Take 1 tablet (50 mg total) by mouth in the morning., Disp: 30 tablet, Rfl: 6 ?  ezetimibe (ZETIA) 10 MG tablet, Take 1 tablet (10 mg total) by mouth daily., Disp: 90 tablet, Rfl: 3 ?  gabapentin (NEURONTIN) 300 MG capsule, Take 1 capsule (300 mg total) by mouth 3 (three) times daily. (Patient taking differently: Take 100 mg by mouth 3 (three) times daily.), Disp: 90 capsule, Rfl: 5 ?  losartan (COZAAR) 25 MG tablet, Take 1/2 tablet (12.5 mg total) by mouth daily., Disp: 30 tablet, Rfl: 5 ?  melatonin 5 MG TABS, Take 1 tablet (5 mg total) by mouth at bedtime. (Patient taking differently: Take 10 mg by mouth 3 (three) times a week.), Disp: 30 tablet, Rfl: 6 ?  nystatin ointment  (MYCOSTATIN), Apply 1 application topically 2 (two) times daily., Disp: 30 g, Rfl: 3 ?  ondansetron (ZOFRAN) 4 MG tablet, Take 1 tablet (4 mg total) by mouth every 8 (eight) hours as needed for nausea or vomiting. (Patient not taking: Reported on 08/09/2021), Disp: 20 tablet, Rfl: 3 ?  pantoprazole (PROTONIX) 40 MG tablet, Take 1 tablet (40 mg total) by mouth daily., Disp: 30 tablet, Rfl: 6 ?  potassium chloride SA (KLOR-CON M) 20 MEQ tablet, Take 1 tablet (20 mEq total) by mouth daily., Disp: 90 tablet, Rfl: 3 ?  sertraline (ZOLOFT) 100 MG tablet, Take 1 tablet (100 mg total) by mouth daily., Disp: 30 tablet, Rfl: 2 ?  tamsulosin (FLOMAX) 0.4 MG CAPS capsule, Take 1 capsule (0.4 mg total) by mouth daily., Disp: 90 capsule, Rfl: 3 ?  torsemide (DEMADEX) 20 MG tablet, Take 1 tablet (20 mg total) by mouth as needed. (Patient not taking: Reported on 08/09/2021), Disp: 30 tablet, Rfl: 5 ?  traMADol (ULTRAM) 50 MG tablet, Take 1 tablet (50 mg total) by mouth every 6 (six) hours as needed for moderate pain. (Patient taking differently: Take 100 mg by mouth 2 (two) times daily as needed for moderate pain.), Disp: 30 tablet, Rfl: 0 ?  warfarin (COUMADIN) 2.5 MG tablet, Take 2.5 mg (1 tablet) every Monday/Friday and 5 mg (2 tablets) all other days or as instructed by LVAD clinic. (Patient taking differently: Take 5 mg by mouth at bedtime. or as  instructed by LVAD clinic.), Disp: 75 tablet, Rfl: 11 ? ?Past Medical History: ?Past Medical History:  ?Diagnosis Date  ? AICD (automatic cardioverter/defibrillator) present 08/31/2019  ? Ankylosing spondylitis ([place])   ? Arthritis   ? BENIGN PROSTATIC HYPERTROPHY 06/07/2008  ? CHF (congestive heart failure) ([place])   ? COLONIC POLYPS, HX OF 06/07/2008  ? Coronary artery disease   ? Depression   ? H/O hiatal hernia   ? Heart failure ([place])   ? HYPERLIPIDEMIA 06/07/2008  ? HYPERTENSION 06/07/2008  ? Myocardial infarction [facility]) 2005  ? NSTEMI, s/p LAD stent  ? NEPHROLITHIASIS, HX OF  06/07/2008  ? STEMI (ST elevation myocardial infarction) ([place]) 04/27/2019  ? ? ?Tobacco Use: ?Social History  ? ?Tobacco Use  ?Smoking Status Former  ? Packs/day: 1.00  ? Years: 40.00  ? Pack years: 40.00  ? Types: Cigarettes  ? Quit date: 07/26/2019  ? Years since quitting: 2.1  ?Smokeless Tobacco Never  ?Tobacco Comments  ? 1-1.5 pks per year x 40-45 yrs  ? ? ?Labs: ?Review Flowsheet   ? ?  ?  Latest Ref Rng & Units 04/25/2021 04/26/2021 04/27/2021 04/28/2021  ?Labs for ITP Cardiac and Pulmonary Rehab  ?O2 Saturation % 69.5   59.5   50.9   36.8    ? ?  05/04/2021  ?Labs for ITP Cardiac and Pulmonary Rehab  ?O2 Saturation 51.2    ?  ? ? Multiple values from one day are sorted in reverse-chronological order  ?  ?  ? ? ?Capillary Blood Glucose: ?Lab Results  ?Component Value Date  ? GLUCAP 100 (H) 05/09/2021  ? GLUCAP 84 05/09/2021  ? GLUCAP 122 (H) 05/08/2021  ? GLUCAP 96 05/08/2021  ? GLUCAP 106 (H) 05/08/2021  ? ? ? ?Exercise Target Goals: ?Exercise Program Goal: ?Individual exercise prescription set using results from initial 6 min walk test and THRR while considering  patient?s activity barriers and safety.  ? ?Exercise Prescription Goal: ?Starting with aerobic activity 30 plus minutes a day, 3 days per week for initial exercise prescription. Provide home exercise prescription and guidelines that participant acknowledges understanding prior to discharge. ? ?Activity Barriers & Risk Stratification: ? Activity Barriers & Cardiac Risk Stratification - 07/26/21 1152   ? ?  ? Activity Barriers & Cardiac Risk Stratification  ? Activity Barriers Neck/Spine Problems;Back Problems;Joint Problems;Muscular Weakness;Shortness of Breath;Balance Concerns   ? Cardiac Risk Stratification High   ? ?  ?  ? ?  ? ? ?6 Minute Walk: ? 6 Minute Walk   ? ? [place] Name 07/26/21 1144 09/17/21 1630  ?  ?  ? 6 Minute Walk  ? Phase Initial Discharge   ? Distance 800 feet 1165 feet   ? Distance % Change 7.92 % 45.63 %   ? Distance Feet Change --  365 ft   ? Walk Time 6 minutes 6 minutes   ? # of Rest Breaks 2  Sitting rest 3:00-3:30 for high HR, standing rest 5:50-6:00 due to pain 0   ? MPH 1.52 2.21   ? METS 2.26 2.8   ? RPE 12 15   ? Perceived Dyspnea  2 1   ? VO2 Peak 7.92 9.84   ? Symptoms Yes (comment) Yes (comment)   ? Comments Bilate hip pain 5/10 and bilate leg pain 5/10. DYS 2 SOB while walking bilateral hip pain starting at 3 mins in until end. 8/10 pain. Relieved with rest. Chronic hip pain.   ? Resting HR 95 bpm 89 bpm   ?  Resting BP 76/0  MAP 82/0   ? Resting Oxygen Saturation  96 % 99 %   ? Exercise Oxygen Saturation  during 6 min walk 98 % 99 %   ? Max Ex. HR 135 bpm 141 bpm   ? Max Ex. BP 84/0  MAP 86/0   ? 2 Minute Post BP 74/0  MAP 82/0   ? ?  ?  ? ?  ? ? ?Oxygen Initial Assessment: ? ? ?Oxygen Re-Evaluation: ? ? ?Oxygen Discharge (Final Oxygen Re-Evaluation): ? ? ?Initial Exercise Prescription: ? Initial Exercise Prescription - 07/26/21 1100   ? ?  ? Date of Initial Exercise RX and Referring Provider  ? Date 07/26/21   ? Referring Provider Dr. Glori Bickers, MD   ? Expected Discharge Date 09/21/21   ?  ? NuStep  ? Level 1   ? SPM 60   ? Minutes 25   ? METs 1.7   ?  ? Prescription Details  ? Frequency (times per week) 3   ? Duration Progress to 30 minutes of continuous aerobic without signs/symptoms of physical distress   ?  ? Intensity  ? THRR 40-80% of Max Heartrate 61-122   ? Ratings of Perceived Exertion 11-13   ? Perceived Dyspnea 0-4   ?  ? Progression  ? Progression Continue progressive overload as per policy without signs/symptoms or physical distress.   ?  ? Resistance Training  ? Training Prescription Yes   ? Weight 3   ? Reps 10-15   ? ?  ?  ? ?  ? ? ?Perform Capillary Blood Glucose checks as needed. ? ?Exercise Prescription Changes: ? ? Exercise Prescription Changes   ? ? [place] Name 07/30/21 1652 08/15/21 1614 08/29/21 1658 09/10/21 1631  ?  ?  ? Response to Exercise  ? Blood Pressure (Admit) 80/0  MAP 90/0  MAP 72/0  MAP 82/0   MAP   ? Blood Pressure (Exercise) 84/0  MAP 84/0  MAP 84/0  MAP 82/0  MAP   ? Blood Pressure (Exit) 78/0  MAP 72/0  MAP 79/0  MAP 80/0  MAP   ? Heart Rate (Admit) 93 bpm 100 bpm 100 bpm 93 bpm   ? Heart Rate (Exe

## 2021-09-19 NOTE — Telephone Encounter (Signed)
Sent message to patient to see if he has upcoming dental procedure.  ? ?Beryle Flock, RN ? ?

## 2021-09-20 ENCOUNTER — Ambulatory Visit (HOSPITAL_COMMUNITY): Payer: Self-pay | Admitting: Pharmacist

## 2021-09-20 LAB — POCT INR: INR: 1.8 — AB (ref 2.0–3.0)

## 2021-09-20 NOTE — Progress Notes (Signed)
LVAD INR 

## 2021-09-21 ENCOUNTER — Encounter (HOSPITAL_COMMUNITY)
Admission: RE | Admit: 2021-09-21 | Discharge: 2021-09-21 | Disposition: A | Discharge status: Home / Self Care | Payer: Medicare Other | Source: Ambulatory Visit | Attending: Internal Medicine | Admitting: Internal Medicine

## 2021-09-21 DIAGNOSIS — I5042 Chronic combined systolic (congestive) and diastolic (congestive) heart failure: Secondary | ICD-10-CM

## 2021-09-21 DIAGNOSIS — Z95811 Presence of heart assist device: Secondary | ICD-10-CM

## 2021-09-21 DIAGNOSIS — Z952 Presence of prosthetic heart valve: Secondary | ICD-10-CM

## 2021-09-24 ENCOUNTER — Encounter (HOSPITAL_COMMUNITY)
Admission: RE | Admit: 2021-09-24 | Discharge: 2021-09-24 | Disposition: A | Discharge status: Home / Self Care | Payer: Medicare Other | Source: Ambulatory Visit | Attending: Internal Medicine | Admitting: Internal Medicine

## 2021-09-24 DIAGNOSIS — I5042 Chronic combined systolic (congestive) and diastolic (congestive) heart failure: Secondary | ICD-10-CM

## 2021-09-24 DIAGNOSIS — Z952 Presence of prosthetic heart valve: Secondary | ICD-10-CM

## 2021-09-24 DIAGNOSIS — Z95811 Presence of heart assist device: Secondary | ICD-10-CM

## 2021-09-24 NOTE — Progress Notes (Signed)
Discharge Progress Report  Patient Details  Name: Brandon [name] MRN: [id number] Date of Birth: Jun 20, 1955 Referring Provider:   Flowsheet Row CARDIAC REHAB PHASE II ORIENTATION from 07/26/2021 in [place]  Referring Provider Dr. Glori Bickers, MD        Number of Visits: 25  Reason for Discharge:  Patient reached a stable level of exercise. Patient independent in their exercise. Patient has met program and personal goals.  Smoking History:  Social History   Tobacco Use  Smoking Status Former   Packs/day: 1.00   Years: 40.00   Pack years: 40.00   Types: Cigarettes   Quit date: 07/26/2019   Years since quitting: 2.1  Smokeless Tobacco Never  Tobacco Comments   1-1.5 pks per year x 40-45 yrs    Diagnosis:  Heart failure, systolic and diastolic, chronic ([place])  61/95/09 LVAD HM3  04/06/21 S/P AVR (aortic valve replacement)  ADL UCSD:   Initial Exercise Prescription:  Initial Exercise Prescription - 07/26/21 1100       Date of Initial Exercise RX and Referring Provider   Date 07/26/21    Referring Provider Dr. Glori Bickers, MD    Expected Discharge Date 09/21/21      NuStep   Level 1    SPM 60    Minutes 25    METs 1.7      Prescription Details   Frequency (times per week) 3    Duration Progress to 30 minutes of continuous aerobic without signs/symptoms of physical distress      Intensity   THRR 40-80% of Max Heartrate 61-122    Ratings of Perceived Exertion 11-13    Perceived Dyspnea 0-4      Progression   Progression Continue progressive overload as per policy without signs/symptoms or physical distress.      Resistance Training   Training Prescription Yes    Weight 3    Reps 10-15             Discharge Exercise Prescription (Final Exercise Prescription Changes):  Exercise Prescription Changes - 09/24/21 1639       Response to Exercise   Blood Pressure (Admit) 84/0   MAP   Blood Pressure  (Exercise) 84/0   MAP   Blood Pressure (Exit) 82/0   MAP   Heart Rate (Admit) 89 bpm    Heart Rate (Exercise) 132 bpm    Heart Rate (Exit) 89 bpm    Rating of Perceived Exertion (Exercise) 11    Perceived Dyspnea (Exercise) 0    Symptoms 0    Comments Pt graduated the CRP2 program    Duration Progress to 30 minutes of  aerobic without signs/symptoms of physical distress    Intensity THRR unchanged      Progression   Progression Continue to progress workloads to maintain intensity without signs/symptoms of physical distress.    Average METs 2.5      Resistance Training   Training Prescription Yes    Weight 4 lbs    Reps 10-15    Time 10 Minutes      Recumbant Bike   Level 1.7    RPM 80    Minutes 15    METs 2.2      NuStep   Level 4    SPM 85    Minutes 15    METs 2.8      Home Exercise Plan   Plans to continue exercise at Home (comment)    Frequency  Add 3 additional days to program exercise sessions.    Initial Home Exercises Provided 08/15/21             Functional Capacity:  6 Minute Walk     Row Name 07/26/21 1144 09/17/21 1630       6 Minute Walk   Phase Initial Discharge    Distance 800 feet 1165 feet    Distance % Change 7.92 % 45.63 %    Distance Feet Change -- 365 ft    Walk Time 6 minutes 6 minutes    # of Rest Breaks 2  Sitting rest 3:00-3:30 for high HR, standing rest 5:50-6:00 due to pain 0    MPH 1.52 2.21    METS 2.26 2.8    RPE 12 15    Perceived Dyspnea  2 1    VO2 Peak 7.92 9.84    Symptoms Yes (comment) Yes (comment)    Comments Bilate hip pain 5/10 and bilate leg pain 5/10. DYS 2 SOB while walking bilateral hip pain starting at 3 mins in until end. 8/10 pain. Relieved with rest. Chronic hip pain.    Resting HR 95 bpm 89 bpm    Resting BP 76/0  MAP 82/0    Resting Oxygen Saturation  96 % 99 %    Exercise Oxygen Saturation  during 6 min walk 98 % 99 %    Max Ex. HR 135 bpm 141 bpm    Max Ex. BP 84/0  MAP 86/0    2 Minute Post BP  74/0  MAP 82/0             Psychological, QOL, Others - Outcomes: PHQ 2/9:    09/24/2021    4:00 PM 09/12/2021   11:37 AM 07/26/2021   10:29 AM 07/19/2021    1:31 PM 05/29/2021    9:53 AM  Depression screen PHQ 2/9  Decreased Interest 0 0 2 0 1  Down, Depressed, Hopeless 0 0 1 0 1  PHQ - 2 Score 0 0 3 0 2  Altered sleeping  1 0 0 0  Tired, decreased energy  1 3 0 1  Change in appetite  0 0 0 0  Feeling bad or failure about yourself   0 0 0 0  Trouble concentrating  1 1 0 0  Moving slowly or fidgety/restless  0 0 0 0  Suicidal thoughts  0 0 0 0  PHQ-9 Score  3 7 0 3  Difficult doing work/chores  Not difficult at all Somewhat difficult      Quality of Life:  Quality of Life - 09/17/21 1646       Quality of Life   Select Quality of Life      Quality of Life Scores   Health/Function Post 24.23 %    Socioeconomic Post 23.43 %    Psych/Spiritual Post 21.79 %    Family Post 22.5 %    GLOBAL Post 23.36 %             Personal Goals: Goals established at orientation with interventions provided to work toward goal.  Personal Goals and Risk Factors at Admission - 07/26/21 1208       Core Components/Risk Factors/Patient Goals on Admission    Weight Management Yes;Weight Loss    Intervention Weight Management: Develop a combined nutrition and exercise program designed to reach desired caloric intake, while maintaining appropriate intake of nutrient and fiber, sodium and fats, and appropriate energy expenditure required for the  weight goal.;Weight Management: Provide education and appropriate resources to help participant work on and attain dietary goals.    Admit Weight 187 lb 13.3 oz (85.2 kg)    Heart Failure Yes    Intervention Provide a combined exercise and nutrition program that is supplemented with education, support and counseling about heart failure. Directed toward relieving symptoms such as shortness of breath, decreased exercise tolerance, and extremity edema.     Expected Outcomes Improve functional capacity of life;Short term: Attendance in program 2-3 days a week with increased exercise capacity. Reported lower sodium intake. Reported increased fruit and vegetable intake. Reports medication compliance.;Short term: Daily weights obtained and reported for increase. Utilizing diuretic protocols set by physician.;Long term: Adoption of self-care skills and reduction of barriers for early signs and symptoms recognition and intervention leading to self-care maintenance.    Hypertension Yes    Intervention Provide education on lifestyle modifcations including regular physical activity/exercise, weight management, moderate sodium restriction and increased consumption of fresh fruit, vegetables, and low fat dairy, alcohol moderation, and smoking cessation.;Monitor prescription use compliance.    Expected Outcomes Short Term: Continued assessment and intervention until BP is < 140/93mm HG in hypertensive participants. < 130/62mm HG in hypertensive participants with diabetes, heart failure or chronic kidney disease.;Long Term: Maintenance of blood pressure at goal levels.    Lipids Yes    Intervention Provide education and support for participant on nutrition & aerobic/resistive exercise along with prescribed medications to achieve LDL '70mg'$ , HDL >$Remo'40mg'JCjpZ$ .    Expected Outcomes Short Term: Participant states understanding of desired cholesterol values and is compliant with medications prescribed. Participant is following exercise prescription and nutrition guidelines.;Long Term: Cholesterol controlled with medications as prescribed, with individualized exercise RX and with personalized nutrition plan. Value goals: LDL < $Rem'70mg'XNEk$ , HDL > 40 mg.    Stress Yes    Intervention Offer individual and/or small group education and counseling on adjustment to heart disease, stress management and health-related lifestyle change. Teach and support self-help strategies.;Refer participants  experiencing significant psychosocial distress to appropriate mental health specialists for further evaluation and treatment. When possible, include family members and significant others in education/counseling sessions.    Expected Outcomes Short Term: Participant demonstrates changes in health-related behavior, relaxation and other stress management skills, ability to obtain effective social support, and compliance with psychotropic medications if prescribed.;Long Term: Emotional wellbeing is indicated by absence of clinically significant psychosocial distress or social isolation.    Personal Goal Other Yes    Personal Goal Short term: know limits to exercise, not feel as tired with ADL's Long term: Control SOB and gain stamina    Intervention Will continue to monitor pt and progress workloads as tolerated without sign or symptom    Expected Outcomes Pt will achieve his goals              Personal Goals Discharge:  Goals and Risk Factor Review     Row Name 08/22/21 1211 09/19/21 1451           Core Components/Risk Factors/Patient Goals Review   Personal Goals Review Weight Management/Obesity;Stress;Hypertension;Lipids Weight Management/Obesity;Stress;Hypertension;Lipids      Review Jeshua's has been doing well with exercise at cardiac rehab. MAP's have been within normal limits. Ramzy reports feeling stronger [place]'s has been doing well with exercise at cardiac rehab. MAP's have been within normal limits. Janiel will complete phase 2 cardiac rehab and plans to continue exercise at the [place] will continue to  participate in phase 2 cardiac rehab for exercise, nutrition and lifestyle modifications. Butch will continue to exercise, follow nutrition and lifestyle modifications upon completion of phase 2 cardiac rehab.               Exercise Goals and Review:  Exercise Goals     Row Name 07/26/21 1155             Exercise Goals   Increase  Physical Activity Yes       Intervention Provide advice, education, support and counseling about physical activity/exercise needs.;Develop an individualized exercise prescription for aerobic and resistive training based on initial evaluation findings, risk stratification, comorbidities and participant's personal goals.       Expected Outcomes Short Term: Attend rehab on a regular basis to increase amount of physical activity.;Long Term: Add in home exercise to make exercise part of routine and to increase amount of physical activity.;Long Term: Exercising regularly at least 3-5 days a week.       Increase Strength and Stamina Yes       Intervention Provide advice, education, support and counseling about physical activity/exercise needs.;Develop an individualized exercise prescription for aerobic and resistive training based on initial evaluation findings, risk stratification, comorbidities and participant's personal goals.       Expected Outcomes Short Term: Increase workloads from initial exercise prescription for resistance, speed, and METs.;Short Term: Perform resistance training exercises routinely during rehab and add in resistance training at home;Long Term: Improve cardiorespiratory fitness, muscular endurance and strength as measured by increased METs and functional capacity (6MWT)       Able to understand and use rate of perceived exertion (RPE) scale Yes       Intervention Provide education and explanation on how to use RPE scale       Expected Outcomes Short Term: Able to use RPE daily in rehab to express subjective intensity level;Long Term:  Able to use RPE to guide intensity level when exercising independently       Knowledge and understanding of Target Heart Rate Range (THRR) Yes       Intervention Provide education and explanation of THRR including how the numbers were predicted and where they are located for reference       Expected Outcomes Short Term: Able to state/look up THRR;Long  Term: Able to use THRR to govern intensity when exercising independently;Short Term: Able to use daily as guideline for intensity in rehab       Understanding of Exercise Prescription Yes       Intervention Provide education, explanation, and written materials on patient's individual exercise prescription       Expected Outcomes Short Term: Able to explain program exercise prescription;Long Term: Able to explain home exercise prescription to exercise independently                Exercise Goals Re-Evaluation:  Exercise Goals Re-Evaluation     Row Name 07/30/21 1654 08/15/21 1617 09/10/21 1634 09/24/21 1641       Exercise Goal Re-Evaluation   Exercise Goals Review Increase Physical Activity;Increase Strength and Stamina;Able to understand and use rate of perceived exertion (RPE) scale;Knowledge and understanding of Target Heart Rate Range (THRR);Understanding of Exercise Prescription Increase Physical Activity;Increase Strength and Stamina;Able to understand and use rate of perceived exertion (RPE) scale;Knowledge and understanding of Target Heart Rate Range (THRR);Understanding of Exercise Prescription Increase Physical Activity;Increase Strength and Stamina;Able to understand and use rate of perceived exertion (RPE) scale;Knowledge and understanding of Target Heart Rate Range (  THRR);Understanding of Exercise Prescription Increase Physical Activity;Increase Strength and Stamina;Able to understand and use rate of perceived exertion (RPE) scale;Knowledge and understanding of Target Heart Rate Range (THRR);Understanding of Exercise Prescription    Comments Pt first day in the CRP2 program. Pt is tolerating exercise well with an average MET level of 2.4. Pt is learning his THRR, RPE and ExRx. Goal's are to increase tolerance of exercise over time so HR does not exceed THRR during exercise. Slowing SPM and keeping low resistance at this time Reviewed MET's, goals and home ExRx. Pt is tolerating exercise  very well with an average MET level of 2.4. Pt is walking 2-4 days at home for 30-45 minutes per session. Reviewed limits to exercise which was a goals of his. Pt feels good about his goals of gaining strength, stamina, and being less tired with ADL's. He feels he is still working on all of these things, but feels better so far. Pt is interested in joining a gym, but wants to see his options and think about it first. Will give materials for local gyms and [place], and goals and home ExRx. Pt is tolerating exercise very well with an average MET level of 2.5. Pt feels good about his goals of gaining stamina and being about to be less tired with ADL's. Pt knows his limits to exercise. Pt still feels SOB sometimes, but it doing better about controlling it when he can. Pt graduated the CRP2 program. Pt has tolerated exercise very well with an average MET level of 2.5. Pt feels good about his goals and has enjoyed being in the [place] program. Pt will meet with [facility] on Wednesday and will continue to walk 5 days a week for 30-45 mins per session    Expected Outcomes Will continue to monitor pt and progress workloads as tolerated without sign or symptom Pt will continue to walk on his own for the days when he is not in CRP2. Will continue to monitor pt and progress workloads as tolerated without sign or symptom. Will continue to monitor pt and progress workloads as tolerated without sign or symptom. Pt will continue to exercise on his own and gain strength             Nutrition & Weight - Outcomes:  Pre Biometrics - 07/26/21 1142       Pre Biometrics   Waist Circumference 39.5 inches    Hip Circumference 38.5 inches    Waist to Hip Ratio 1.03 %    Triceps Skinfold 5 mm    % Body Fat 21.8 %    Grip Strength 28 kg    Flexibility --   Unable to reach   Single Leg Stand 5.6 seconds             Post Biometrics - 09/17/21 1642        Post  Biometrics   Height 5' 11.75"  (1.822 m)    Weight 89.7 kg    Waist Circumference 40 inches    Hip Circumference 41.25 inches    Waist to Hip Ratio 0.97 %    BMI (Calculated) 27.02    Triceps Skinfold 6 mm    % Body Fat 23.3 %    Grip Strength 33 kg    Flexibility --   Pt unable to reach   Single Leg Stand 4.6 seconds             Nutrition:  Nutrition Therapy & Goals - 08/27/21 7673  Nutrition Therapy   Diet Heart healthy Diet    Drug/Food Interactions Statins/Certain Fruits;Coumadin/Vit K      Personal Nutrition Goals   Nutrition Goal Patient to describe the benefit of including fruits/vegetables, whole grains, and low fat dairy products in a heart healthy diet.    Personal Goal #2 Patient to increase fiber intake. Patient will use the plate method as a guide for meal planning to make 1/2 the plate vegetables    Personal Goal #3 Patient to continue to limit simple sugars/refined carbohydrates. Patient will opt for single serve sweet treats to aid with limiting portions.    Comments Patient reports appropriate nutrition knowledge of heart healthy  diet. He lives alone and enjoys cooking. He uses an airfryer, monitors sodium intake. Patient reports no nutrition questions/concerns at this time.      Intervention Plan   Intervention Prescribe, educate and counsel regarding individualized specific dietary modifications aiming towards targeted core components such as weight, hypertension, lipid management, diabetes, heart failure and other comorbidities.    Expected Outcomes Short Term Goal: Understand basic principles of dietary content, such as calories, fat, sodium, cholesterol and nutrients.;Long Term Goal: Adherence to prescribed nutrition plan.             Nutrition Discharge:   Education Questionnaire Score:  Knowledge Questionnaire Score - 07/26/21 1205       Knowledge Questionnaire Score   Pre Score 21/24             Goals reviewed with patient; copy given to patient.H[place]  graduated from cardiac rehab program on 09/24/21  with completion of 25 exercise sessions in Phase II. Pt maintained good attendance and progressed nicely during his participation in rehab as evidenced by increased MET level.   Medication list reconciled. Repeat  PHQ score- 0 .  Pt has made significant lifestyle changes and should be commended for his success. Pt feels he has achieved his goals during cardiac rehab.   Pt plans to continue exercise by attending the [facility]. Drey increased his distance on his post exercise walk test by 365 feet! We are proud of Braelon's progress. Kaizer says that attending cardiac has been helpful to he feels stronger.Harrell Gave RN BSN

## 2021-09-27 ENCOUNTER — Ambulatory Visit (HOSPITAL_COMMUNITY): Payer: Self-pay | Admitting: Pharmacist

## 2021-09-27 LAB — POCT INR: INR: 1.7 — AB (ref 2.0–3.0)

## 2021-09-27 NOTE — Progress Notes (Signed)
LVAD INR 

## 2021-10-01 ENCOUNTER — Other Ambulatory Visit (HOSPITAL_COMMUNITY): Payer: Self-pay

## 2021-10-01 ENCOUNTER — Ambulatory Visit (INDEPENDENT_AMBULATORY_CARE_PROVIDER_SITE_OTHER): Payer: Medicare Other

## 2021-10-01 DIAGNOSIS — I2510 Atherosclerotic heart disease of native coronary artery without angina pectoris: Secondary | ICD-10-CM

## 2021-10-01 DIAGNOSIS — I10 Essential (primary) hypertension: Secondary | ICD-10-CM

## 2021-10-01 DIAGNOSIS — I5042 Chronic combined systolic (congestive) and diastolic (congestive) heart failure: Secondary | ICD-10-CM

## 2021-10-01 DIAGNOSIS — M456 Ankylosing spondylitis lumbar region: Secondary | ICD-10-CM

## 2021-10-01 DIAGNOSIS — Z95811 Presence of heart assist device: Secondary | ICD-10-CM

## 2021-10-01 NOTE — Patient Instructions (Signed)
Visit Information  Thank you for taking time to visit with me today. Please don't hesitate to contact me if I can be of assistance to you before our next scheduled telephone appointment.  Following are the goals we discussed today:  Take all medications as prescribed Attend all scheduled provider appointments Call pharmacy for medication refills 3-7 days in advance of running out of medications Perform all self care activities independently  Call provider office for new concerns or questions  call office if I gain more than 2 pounds in one day or 5 pounds in one week keep legs up while sitting watch for swelling in feet, ankles and legs every day weigh myself daily follow rescue plan if symptoms flare-up eat more whole grains, fruits and vegetables, lean meats and healthy fats keep all doctor appointments call for medicine refill 2 or 3 days before it runs out take all medications exactly as prescribed call doctor with any symptoms you believe are related to your medicine  Our next appointment is by telephone on 01/07/22 at 10 AM  Please call the care guide team at [phone] if you need to cancel or reschedule your appointment.   If you are experiencing a Mental Health or [place] or need someone to talk to, please call the Suicide and Crisis Lifeline: 988 call the [place] National Suicide Prevention Lifeline: 3301159332 or TTY: 319 744 3566 TTY 425-170-2668) to talk to a trained counselor call 1-800-273-TALK (toll free, 24 hour hotline) go to [facility] [address], [place] [phone]) call 911   Patient verbalizes understanding of instructions and care plan provided today and agrees to view in [place]. Active MyChart status and patient understanding of how to access instructions and care plan via MyChart confirmed with patient.     Yasuo Garter RN, Jackquline Denmark, CDE Care Management Coordinator [facility] [phone]

## 2021-10-01 NOTE — Chronic Care Management (AMB) (Signed)
Chronic Care Management   CCM RN Visit Note  10/01/2021 Name: Brandon [name] MRN: [id number] DOB: 29-Oct-1955  Subjective: Brandon [name] is a 66 y.o. year old male who is a primary care patient of [name], MD. The care management team was consulted for assistance with disease management and care coordination needs.    Engaged with patient by telephone for follow up visit in response to provider referral for case management and/or care coordination services.   Consent to Services:  The patient was given information about Chronic Care Management services, agreed to services, and gave verbal consent prior to initiation of services.  Please see initial visit note for detailed documentation.   Patient agreed to services and verbal consent obtained.   Assessment: Review of patient past medical history, allergies, medications, health status, including review of consultants reports, laboratory and other test data, was performed as part of comprehensive evaluation and provision of chronic care management services.   SDOH (Social Determinants of Health) assessments and interventions performed:    CCM Care Plan  No Known Allergies  Outpatient Encounter Medications as of 10/01/2021  Medication Sig   acetaminophen (TYLENOL) 500 MG tablet Take 1,000 mg by mouth every 6 (six) hours as needed (pain.).   atorvastatin (LIPITOR) 80 MG tablet TAKE 1 TABLET BY MOUTH ONCE DAILY AT 6PM.   Carboxymethylcellulose Sodium (ARTIFICIAL TEARS OP) Place 1 drop into both eyes daily as needed (for dryness or irritation).   eplerenone (INSPRA) 50 MG tablet Take 1 tablet (50 mg total) by mouth in the morning.   ezetimibe (ZETIA) 10 MG tablet Take 1 tablet (10 mg total) by mouth daily.   gabapentin (NEURONTIN) 300 MG capsule Take 1 capsule (300 mg total) by mouth 3 (three) times daily. (Patient taking differently: Take 100 mg by mouth 3 (three) times daily.)   losartan (COZAAR) 25 MG tablet Take 1/2 tablet (12.5  mg total) by mouth daily.   melatonin 5 MG TABS Take 1 tablet (5 mg total) by mouth at bedtime. (Patient taking differently: Take 10 mg by mouth 3 (three) times a week.)   nystatin ointment (MYCOSTATIN) Apply 1 application topically 2 (two) times daily.   ondansetron (ZOFRAN) 4 MG tablet Take 1 tablet (4 mg total) by mouth every 8 (eight) hours as needed for nausea or vomiting. (Patient not taking: Reported on 08/09/2021)   pantoprazole (PROTONIX) 40 MG tablet Take 1 tablet (40 mg total) by mouth daily.   potassium chloride SA (KLOR-CON M) 20 MEQ tablet Take 1 tablet (20 mEq total) by mouth daily.   sertraline (ZOLOFT) 100 MG tablet Take 1 tablet (100 mg total) by mouth daily.   tamsulosin (FLOMAX) 0.4 MG CAPS capsule Take 1 capsule (0.4 mg total) by mouth daily.   torsemide (DEMADEX) 20 MG tablet Take 1 tablet (20 mg total) by mouth as needed. (Patient not taking: Reported on 08/09/2021)   traMADol (ULTRAM) 50 MG tablet Take 1 tablet (50 mg total) by mouth every 6 (six) hours as needed for moderate pain. (Patient taking differently: Take 100 mg by mouth 2 (two) times daily as needed for moderate pain.)   warfarin (COUMADIN) 2.5 MG tablet Take 2.5 mg (1 tablet) every Monday/Friday and 5 mg (2 tablets) all other days or as instructed by LVAD clinic. (Patient taking differently: Take 5 mg by mouth at bedtime. or as instructed by LVAD clinic.)   No facility-administered encounter medications on file as of 10/01/2021.    Patient Active Problem List  Diagnosis Date Noted   Long term (current) use of anticoagulants 05/15/2021   LVAD (left ventricular assist device) present ([place]) 04/06/2021   Protein-calorie malnutrition, severe 03/14/2021   ILD (interstitial lung disease) ([place])    Ankylosing spondylitis (HCC)    Chronic systolic CHF (congestive heart failure) ([place]) 03/07/2021   Acute on chronic systolic (congestive) heart failure ([place]) 01/08/2021   Dental caries    Malnutrition of moderate degree  12/01/2020   Pressure injury of skin 12/01/2020   Cholelithiases    Aortic valve endocarditis    Elevated brain natriuretic peptide (BNP) level 11/30/2020   Elevated troponin 11/30/2020   Hypokalemia 11/30/2020   Hypomagnesemia 11/30/2020   Normocytic anemia 11/30/2020   Dyspnea    Infected defibrillator ([place])    Bacteremia    Central line infection    Acute respiratory failure with hypoxia and hypercapnia ([place]) 11/29/2020   Centrilobular emphysema ([place]) 08/09/2020   Benign neoplasm of colon    Heart failure ([place]) 12/31/2019   Ischemic cardiomyopathy 08/31/2019   STEMI (ST elevation myocardial infarction) ([place]) 04/27/2019   Acute anterior wall MI ([place]) 04/27/2019   Mild aortic stenosis 12/02/2012   Ankylosis of sacroiliac joint 03/08/2011   TOBACCO ABUSE 03/13/2009   Hyperlipidemia with target LDL less than 70 06/07/2008   Essential hypertension 06/07/2008   CAD (coronary artery disease) 06/07/2008   BENIGN PROSTATIC HYPERTROPHY 06/07/2008   History of colon polyps 06/07/2008   NEPHROLITHIASIS, HX OF 06/07/2008    Conditions to be addressed/monitored:CHF, CAD, HTN, HLD, and LVAD, ankylosing spondylitis  Care Plan : RN Care Manager Plan of Care  Updates made by Dimitri Ped, RN since 10/01/2021 12:00 AM     Problem: Chronic Disease Management and Care Coordination Needs (LVAD, CAD, CHF,HTN,  HLD, ankylosing spondylitis)   Priority: High     Long-Range Goal: Establish Plan of Care for Chronic Disease Management Needs (LVAD, CAD, CHF,HTN,  HLD, ankylosing spondylitis)   Start Date: 05/29/2021  Expected End Date: 10/01/2022  Priority: High  Note:   Current Barriers:  Knowledge Deficits related to plan of care for management of CHF, CAD, HTN, HLD, and LVAD, ankylosing spondylitis  Chronic Disease Management support and education needs related to CHF, CAD, HTN, HLD, and LVAD, ankylosing spondylitis  States that he has been feeling good and continues to get stronger.   States he completed  cardiac rehab and he is now going to [place] to exercise.  States he plans to go every other day to exercise States  his LVAD site has  no signs of infection. States his neighbor changes his LVAD dressing weekly.  States he is to see the LVAD clinic on 10/11/21.  Denies any chest pain or swelling.  States he has some shortness of breath with exertion but it has improved with his cardiac rehab. States he is weighing daily and he weighed 196.5 today.  States he trying to eat healthy and he is back to his weight before he got sick.  States he still  some back and shoulder pain and states he wants to get back on the Remicade when his rheumatologist approves him to restart.     RNCM Clinical Goal(s):  Patient will verbalize understanding of plan for management of CHF, CAD, HTN, HLD, and LVAD, ankylosing spondylitis as evidenced by voiced adherence to plan of care verbalize basic understanding of  CHF, CAD, HTN, HLD, and LVAD, ankylosing spondylitis disease process and self health management plan as evidenced by voiced understanding and teach  back take all medications exactly as prescribed and will call provider for medication related questions as evidenced by dispense report and pt verbalization attend all scheduled medical appointments: Annual Wellness Visit 07/22/22, VAD clinic 10/11/21 as evidenced by medical records demonstrate Improved adherence to prescribed treatment plan for CHF, CAD, HTN, HLD, and LVAD, ankylosing spondylitis as evidenced by readings within limits, voiced adherence to plan of care continue to work with RN Care Manager to address care management and care coordination needs related to  CHF, CAD, HTN, HLD, and LVAD, ankylosing spondylitis as evidenced by adherence to CM Team Scheduled appointments through collaboration with RN Care manager, provider, and care team.   Interventions: 1:1 collaboration with primary care provider regarding development and update of  comprehensive plan of care as evidenced by provider attestation and co-signature Inter-disciplinary care team collaboration (see longitudinal plan of care) Evaluation of current treatment plan related to  self management and patient's adherence to plan as established by provider  LVAD  (Status:  Goal on track:  Yes.)  Long Term Goal Evaluation of current treatment plan related to  LVAD  ,  LVAD  self-management and patient's adherence to plan as established by provider. Discussed plans with patient for ongoing care management follow up and provided patient with direct contact information for care management team Evaluation of current treatment plan related to LVAD and patient's adherence to plan as established by provider Provided education to patient re: LVAD  Reviewed to call LVAD clinic for any sign of infection of LVAD site. Reviewed to continue exercise at [facility] and to try to be as active as tolerated. Reinforced to follow a low sodium well balanced diet with protein to help build up muscle  Pain /ankylosing spondylitisInterventions:  (Status:  Goal on track:  Yes.) Long Term Goal Pain assessment performed Medications reviewed Reviewed provider established plan for pain management Discussed importance of adherence to all scheduled medical appointments Counseled on the importance of reporting any/all new or changed pain symptoms or management strategies to pain management provider Advised patient to report to care team affect of pain on daily activities Discussed use of relaxation techniques and/or diversional activities to assist with pain reduction (distraction, imagery, relaxation, massage, acupressure, TENS, heat, and cold application Reinforced to follow up with his rheumatology to discuss resuming his Remicade    CAD Interventions: (Status:  Goal on track:  Yes.) Long Term Goal Assessed understanding of CAD diagnosis Medications reviewed including medications utilized in CAD  treatment plan Counseled on importance of regular laboratory monitoring as prescribed Counseled on the importance of exercise goals with target of 150 minutes per week Reviewed Importance of taking all medications as prescribed Reviewed Importance of attending all scheduled provider appointments Advised to report any changes in symptoms or exercise tolerance   Heart Failure Interventions:  (Status:  Goal on track:  Yes.) Long Term Goal Provided education on low sodium diet Reviewed Heart Failure Action Plan in depth and provided written copy Discussed importance of daily weight and advised patient to weigh and record daily Discussed the importance of keeping all appointments with provider Provided patient with education about the role of exercise in the management of heart failure   Hyperlipidemia Interventions:  (Status:  Condition stable.  Not addressed this visit.) Long Term Goal Medication review performed; medication list updated in electronic medical record.  Provider established cholesterol goals reviewed Counseled on importance of regular laboratory monitoring as prescribed Reviewed role and benefits of statin for ASCVD risk reduction Reviewed importance  of limiting foods high in cholesterol  Hypertension Interventions:  (Status:  Goal on track:  Yes.) Long Term Goal Last practice recorded BP readings:  BP Readings from Last 3 Encounters:  09/12/21 114/85  08/09/21 103/71  07/26/21 (!) 76/0  Most recent eGFR/CrCl: No results found for: EGFR  No components found for: CRCL  Evaluation of current treatment plan related to hypertension self management and patient's adherence to plan as established by provider Provided education to patient re: stroke prevention, s/s of heart attack and stroke Reviewed medications with patient and discussed importance of compliance Counseled on the importance of exercise goals with target of 150 minutes per week Screening for signs and symptoms  of depression related to chronic disease state   Surgery (LVAD insertion):  (Status: Goal Met.) Short Term Goal Evaluation of current treatment plan related to LVAD surgery assessed patient/caregiver understanding of surgical procedure   reviewed post-operative instructions with patient/caregiver addressed questions about post - surgical incision care  Reinforced s/sx of infection of insertion site to call provider. Reinforced to try to eat a well balanced low sodium diet with protein to help with healing  Patient Goals/Self-Care Activities: Take all medications as prescribed Attend all scheduled provider appointments Call pharmacy for medication refills 3-7 days in advance of running out of medications Perform all self care activities independently  Call provider office for new concerns or questions  call office if I gain more than 2 pounds in one day or 5 pounds in one week keep legs up while sitting watch for swelling in feet, ankles and legs every day weigh myself daily follow rescue plan if symptoms flare-up eat more whole grains, fruits and vegetables, lean meats and healthy fats keep all doctor appointments call for medicine refill 2 or 3 days before it runs out take all medications exactly as prescribed call doctor with any symptoms you believe are related to your medicine  Follow Up Plan:  Telephone follow up appointment with care management team member scheduled for:  01/07/22 The patient has been provided with contact information for the care management team and has been advised to call with any health related questions or concerns.       Plan:Telephone follow up appointment with care management team member scheduled for:  01/07/22 The patient has been provided with contact information for the care management team and has been advised to call with any health related questions or concerns.  Harding Garter RN, Jackquline Denmark, CDE Care Management Coordinator [facility] [phone]

## 2021-10-02 ENCOUNTER — Other Ambulatory Visit (HOSPITAL_COMMUNITY): Payer: Self-pay

## 2021-10-04 ENCOUNTER — Ambulatory Visit (HOSPITAL_COMMUNITY): Payer: Self-pay | Admitting: Pharmacist

## 2021-10-04 LAB — POCT INR: INR: 1.9 — AB (ref 2.0–3.0)

## 2021-10-04 NOTE — Progress Notes (Signed)
LVAD INR 

## 2021-10-09 ENCOUNTER — Other Ambulatory Visit (HOSPITAL_COMMUNITY): Payer: Self-pay | Admitting: *Deleted

## 2021-10-09 ENCOUNTER — Other Ambulatory Visit (HOSPITAL_COMMUNITY): Payer: Self-pay | Admitting: Cardiology

## 2021-10-09 DIAGNOSIS — Z95811 Presence of heart assist device: Secondary | ICD-10-CM

## 2021-10-09 DIAGNOSIS — Z7901 Long term (current) use of anticoagulants: Secondary | ICD-10-CM

## 2021-10-10 DIAGNOSIS — I10 Essential (primary) hypertension: Secondary | ICD-10-CM

## 2021-10-10 DIAGNOSIS — M456 Ankylosing spondylitis lumbar region: Secondary | ICD-10-CM

## 2021-10-10 DIAGNOSIS — I2510 Atherosclerotic heart disease of native coronary artery without angina pectoris: Secondary | ICD-10-CM

## 2021-10-10 DIAGNOSIS — Z87891 Personal history of nicotine dependence: Secondary | ICD-10-CM | POA: Diagnosis not present

## 2021-10-10 DIAGNOSIS — I5042 Chronic combined systolic (congestive) and diastolic (congestive) heart failure: Secondary | ICD-10-CM

## 2021-10-11 ENCOUNTER — Other Ambulatory Visit (HOSPITAL_COMMUNITY): Payer: Self-pay | Admitting: *Deleted

## 2021-10-11 ENCOUNTER — Ambulatory Visit (HOSPITAL_COMMUNITY): Payer: Self-pay | Admitting: Pharmacist

## 2021-10-11 ENCOUNTER — Ambulatory Visit (HOSPITAL_COMMUNITY)
Admission: RE | Admit: 2021-10-11 | Discharge: 2021-10-11 | Disposition: A | Discharge status: Home / Self Care | Payer: Medicare Other | Source: Ambulatory Visit | Attending: Internal Medicine | Admitting: Internal Medicine

## 2021-10-11 ENCOUNTER — Encounter (HOSPITAL_COMMUNITY): Payer: Self-pay

## 2021-10-11 VITALS — BP 97/85 | HR 80 | Ht 72.0 in | Wt 197.6 lb

## 2021-10-11 DIAGNOSIS — Z955 Presence of coronary angioplasty implant and graft: Secondary | ICD-10-CM | POA: Insufficient documentation

## 2021-10-11 DIAGNOSIS — I252 Old myocardial infarction: Secondary | ICD-10-CM | POA: Insufficient documentation

## 2021-10-11 DIAGNOSIS — I240 Acute coronary thrombosis not resulting in myocardial infarction: Secondary | ICD-10-CM | POA: Diagnosis not present

## 2021-10-11 DIAGNOSIS — I6521 Occlusion and stenosis of right carotid artery: Secondary | ICD-10-CM | POA: Insufficient documentation

## 2021-10-11 DIAGNOSIS — Z7901 Long term (current) use of anticoagulants: Secondary | ICD-10-CM

## 2021-10-11 DIAGNOSIS — Z953 Presence of xenogenic heart valve: Secondary | ICD-10-CM | POA: Diagnosis not present

## 2021-10-11 DIAGNOSIS — I480 Paroxysmal atrial fibrillation: Secondary | ICD-10-CM | POA: Diagnosis not present

## 2021-10-11 DIAGNOSIS — Z952 Presence of prosthetic heart valve: Secondary | ICD-10-CM

## 2021-10-11 DIAGNOSIS — I110 Hypertensive heart disease with heart failure: Secondary | ICD-10-CM | POA: Insufficient documentation

## 2021-10-11 DIAGNOSIS — T82857D Stenosis of cardiac prosthetic devices, implants and grafts, subsequent encounter: Secondary | ICD-10-CM | POA: Insufficient documentation

## 2021-10-11 DIAGNOSIS — I351 Nonrheumatic aortic (valve) insufficiency: Secondary | ICD-10-CM | POA: Diagnosis not present

## 2021-10-11 DIAGNOSIS — Z7982 Long term (current) use of aspirin: Secondary | ICD-10-CM | POA: Insufficient documentation

## 2021-10-11 DIAGNOSIS — Z87891 Personal history of nicotine dependence: Secondary | ICD-10-CM | POA: Insufficient documentation

## 2021-10-11 DIAGNOSIS — Z95811 Presence of heart assist device: Secondary | ICD-10-CM | POA: Diagnosis not present

## 2021-10-11 DIAGNOSIS — M459 Ankylosing spondylitis of unspecified sites in spine: Secondary | ICD-10-CM | POA: Diagnosis not present

## 2021-10-11 DIAGNOSIS — Z8679 Personal history of other diseases of the circulatory system: Secondary | ICD-10-CM | POA: Diagnosis not present

## 2021-10-11 DIAGNOSIS — Y838 Other surgical procedures as the cause of abnormal reaction of the patient, or of later complication, without mention of misadventure at the time of the procedure: Secondary | ICD-10-CM | POA: Diagnosis not present

## 2021-10-11 DIAGNOSIS — I2510 Atherosclerotic heart disease of native coronary artery without angina pectoris: Secondary | ICD-10-CM

## 2021-10-11 DIAGNOSIS — F3289 Other specified depressive episodes: Secondary | ICD-10-CM

## 2021-10-11 DIAGNOSIS — I5022 Chronic systolic (congestive) heart failure: Secondary | ICD-10-CM | POA: Diagnosis not present

## 2021-10-11 DIAGNOSIS — Z79899 Other long term (current) drug therapy: Secondary | ICD-10-CM | POA: Insufficient documentation

## 2021-10-11 LAB — PREALBUMIN: Prealbumin: 18.9 mg/dL (ref 18–38)

## 2021-10-11 LAB — COMPREHENSIVE METABOLIC PANEL
ALT: 20 U/L (ref 0–44)
AST: 22 U/L (ref 15–41)
Albumin: 3.9 g/dL (ref 3.5–5.0)
Alkaline Phosphatase: 127 U/L — ABNORMAL HIGH (ref 38–126)
Anion gap: 6 (ref 5–15)
BUN: 12 mg/dL (ref 8–23)
CO2: 27 mmol/L (ref 22–32)
Calcium: 9.5 mg/dL (ref 8.9–10.3)
Chloride: 106 mmol/L (ref 98–111)
Creatinine, Ser: 0.94 mg/dL (ref 0.61–1.24)
GFR, Estimated: >60 mL/min (ref >60)
Glucose, Bld: 108 mg/dL — ABNORMAL HIGH (ref 70–99)
Potassium: 4.6 mmol/L (ref 3.5–5.1)
Sodium: 139 mmol/L (ref 135–145)
Total Bilirubin: 0.5 mg/dL (ref 0.3–1.2)
Total Protein: 7.6 g/dL (ref 6.5–8.1)

## 2021-10-11 LAB — PROTIME-INR
INR: 1.7 — ABNORMAL HIGH (ref 0.8–1.2)
Prothrombin Time: 19.5 seconds — ABNORMAL HIGH (ref 11.4–15.2)

## 2021-10-11 LAB — CBC
HCT: 42.8 % (ref 39.0–52.0)
Hemoglobin: 13.1 g/dL (ref 13.0–17.0)
MCH: 25.5 pg — ABNORMAL LOW (ref 26.0–34.0)
MCHC: 30.6 g/dL (ref 30.0–36.0)
MCV: 83.4 fL (ref 80.0–100.0)
Platelets: 221 10*3/uL (ref 150–400)
RBC: 5.13 MIL/uL (ref 4.22–5.81)
RDW: 18 % — ABNORMAL HIGH (ref 11.5–15.5)
WBC: 7.1 10*3/uL (ref 4.0–10.5)
nRBC: 0 % (ref 0.0–0.2)

## 2021-10-11 LAB — LACTATE DEHYDROGENASE: LDH: 154 U/L (ref 98–192)

## 2021-10-11 MED ORDER — SERTRALINE HCL 100 MG PO TABS: 100.0000 mg | ORAL_TABLET | Freq: Every day | ORAL | 3 refills | Status: DC

## 2021-10-11 NOTE — Progress Notes (Signed)
LVAD Follow-up Clinic Note  PCP: [name], MD HF MD: DB   HPI:  Brandon [name] is a 66 year old with history of tobacco use, CAD s/p previous anterior MI, severe systolic HF s/p HM-3 VAD 39/76/73, severe AS s/p AVR, endocarditis.    Admitted 12/20 with anterior ST elevation MI. Cath with chronically occluded RCA (L>>R collaterals) and thrombotic occlusion of proximal LAD treated with PCI. Echo w/ reduced LVEF 30-35% w/ apical aneurysm. RV ok. Post cath, he required milrinone for cardiogenic shock. Subsequently underwent Barostim.   Underwent outpatient [facility] 11/29/20 as part of VAD w/u. Post procedure, developed severe rigors and fever. Admitted to ICU Blood Cx + strep. 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  Likely low-flow low gradient AS. Underwent ICD extraction..    Admitted for cardiogenic shock in 10/22.  He underwent placement of HM3 VAD and aortic valve replacement with 25 mm Edwards Inspiris Resilia valve on 04/06/21. Surgical path came back 11/28 with evidence of possible "acute aortic valve endocarditis". 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 Discharged 05/09/21.    Follow up for Heart Failure/LVAD: Here for routine f/u. Feels great. Denies CP or SOB. No edema. Wanting to know about remicade for ankylosing spondylitis. Denies orthopnea or PND. No fevers, chills or problems with driveline. No bleeding, melena or neuro symptoms. No VAD alarms. Taking all meds as prescribed.     VAD Interrogation: Speed: 5800 Flow: 5.3 Power: 4.8w    PI: 3.3 Alarms: none Events: 40 - 50 PI events daily  Fixed speed: 5800 Low speed limit: 5500   Primary controller: back up battery due for replacement in 26 months Secondary controller:  back up battery due  for replacement in 27 months    I reviewed the LVAD parameters from today and compared the results to the patient's prior recorded data. LVAD interrogation was NEGATIVE for significant power changes, NEGATIVE for clinical alarms and STABLE for PI events/speed drops. No programming changes were made and pump is functioning within specified parameters. Pt is performing daily controller and system monitor self tests along with completing weekly and monthly maintenance for LVAD equipment.   LVAD equipment check completed and is in good working order. Back-up equipment  present. Charged back up battery and performed self-test on equipment.    Annual Equipment Maintenance on UBC/PM was performed on 12/22.      Past Medical History:  Diagnosis Date   AICD (automatic cardioverter/defibrillator) present 08/31/2019   Ankylosing spondylitis ([place])    Arthritis    BENIGN PROSTATIC HYPERTROPHY 06/07/2008   CHF (congestive heart failure) ([place])    COLONIC POLYPS, HX OF 06/07/2008   Coronary artery disease    Depression    H/O hiatal hernia    Heart failure ([place])    HYPERLIPIDEMIA 06/07/2008   HYPERTENSION 06/07/2008   Myocardial infarction [facility]) 2005   NSTEMI, s/p LAD stent   NEPHROLITHIASIS, HX OF 06/07/2008   STEMI (ST elevation myocardial infarction) ([place]) 04/27/2019    Current Outpatient Medications  Medication Sig Dispense Refill   acetaminophen (TYLENOL) 500 MG tablet Take 1,000 mg by mouth every 6 (six) hours as needed (pain.).     atorvastatin (LIPITOR) 80 MG tablet TAKE 1 TABLET BY MOUTH ONCE DAILY AT 6PM. 90 tablet 3   Carboxymethylcellulose Sodium (ARTIFICIAL TEARS  OP) Place 1 drop into both eyes daily as needed (for dryness or irritation).     eplerenone (INSPRA) 50 MG tablet Take 1 tablet (50 mg total) by mouth in the morning. 30 tablet 6   ezetimibe (ZETIA) 10 MG tablet Take 1 tablet (10 mg total) by mouth daily. 90 tablet 3   gabapentin (NEURONTIN) 300 MG capsule Take 1 capsule  (300 mg total) by mouth 3 (three) times daily. 90 capsule 5   losartan (COZAAR) 25 MG tablet Take 1/2 tablet (12.5 mg total) by mouth daily. 30 tablet 5   melatonin 5 MG TABS Take 1 tablet (5 mg total) by mouth at bedtime. (Patient taking differently: Take 10 mg by mouth 3 (three) times a week.) 30 tablet 6   pantoprazole (PROTONIX) 40 MG tablet Take 1 tablet (40 mg total) by mouth daily. 30 tablet 6   potassium chloride SA (KLOR-CON M) 20 MEQ tablet Take 1 tablet (20 mEq total) by mouth daily. 90 tablet 3   tamsulosin (FLOMAX) 0.4 MG CAPS capsule Take 1 capsule (0.4 mg total) by mouth daily. 90 capsule 3   traMADol (ULTRAM) 50 MG tablet Take 1 tablet (50 mg total) by mouth every 6 (six) hours as needed for moderate pain. (Patient taking differently: Take 100 mg by mouth 2 (two) times daily as needed for moderate pain.) 30 tablet 0   warfarin (COUMADIN) 2.5 MG tablet Take 2.5 mg (1 tablet) every Monday/Friday and 5 mg (2 tablets) all other days or as instructed by LVAD clinic. (Patient taking differently: Take 5 mg by mouth at bedtime. or as instructed by LVAD clinic.- 3 pills on Monday, 2 pills all the other days) 75 tablet 11   nystatin ointment (MYCOSTATIN) Apply 1 application topically 2 (two) times daily. (Patient not taking: Reported on 10/11/2021) 30 g 3   ondansetron (ZOFRAN) 4 MG tablet Take 1 tablet (4 mg total) by mouth every 8 (eight) hours as needed for nausea or vomiting. (Patient not taking: Reported on 08/09/2021) 20 tablet 3   sertraline (ZOLOFT) 100 MG tablet Take 1 tablet (100 mg total) by mouth daily. 90 tablet 3   torsemide (DEMADEX) 20 MG tablet Take 1 tablet (20 mg total) by mouth as needed. (Patient not taking: Reported on 08/09/2021) 30 tablet 5   No current facility-administered medications for this encounter.    Patient has no known allergies.    Vitals:   10/11/21 1037 10/11/21 1038  BP: (!) 98/0 97/85  Pulse:  80  SpO2:  100%  Weight:  89.6 kg (197 lb 9.6 oz)   Height:  6' (1.829 m)     Vital Signs:     Doppler Pressure: 98 Automatc BP:  97/85 (91)        HR:  80                          SPO2:  100% on RA   Weight: 197.6 lb w/ eqt Last weight: 191 lb Ht: 6'    Physical Exam: General:  NAD.  HEENT: normal  Neck: supple. JVP not elevated.  Carotids 2+ bilat; no bruits. No lymphadenopathy or thryomegaly appreciated. Cor: LVAD hum.  Lungs: Clear. Abdomen: soft, nontender, non-distended. No hepatosplenomegaly. No bruits or masses. Good bowel sounds. Driveline site clean. Anchor in place.  Extremities: no cyanosis, clubbing, rash. Warm no edema  Neuro: alert & oriented x 3. No focal deficits. Moves all 4 without problem  ASSESSMENT AND PLAN:  1. Chronic systolic HFr EF due to iCM - Echo 05/19/20 EF 20-25% RV mildly HK. moderate AS  Mean gradient 13 AVA 1.2 cm2 DI 0.30 - s/p HM-III VAD + bioprosthetic AVR 04/06/21 - Doing great. NYHA I - Orthostasis resolved, Off all diurteics - Continue Losartan 12.5 mg daily - Continue eplerenone 12.5 mg daily - Continue CR   2. HM-3 LVAD implant - VAD interrogated personally. Parameters stable. - ASA stopped 12/26 - INR 1.7 Goal 2.0-3.0 Discussed dosing with PharmD personally. - LDH 154 - Hgb 13.1 - MAPs ok   3. Severe low-flow aortic stenosis s/p AVR - s/p VAD/bioprosthetic AVR on 11/25   4. CAD  - s/p anterior STEMI (12/20). LHC showed chronically occluded RCA (with L>>R collaterals) and thrombotic occlusion of proximal LAD. Underwent PCI of LAD. - Now s/p VAD - No s/s angina - Continue atorvastatin 80.  - off ASA.    5. Paroxysmal Atrial Fibrillation w/ RVR - Remains in NSR  - Off amio - on warfarin.    6. 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  - Bcx recollected and pending  - Tissue sent to [facility] in [place] for broad 16 S ribosomal sequencing for bacterial pathogens as well  as T wet Bhilai Bartonella Brucella  - Ceftriaxone 2 g IV daily x 6 weeks. Completed May 22, 2021. PICC is out - ICD removed - No infectious symptoms. WBC ok  7. Ankylosing spondylitis - His local rheumatologist is hesitant to start remicade with VAD/infection risk - D/w [name] VAD program they have recommended f/u with Veva Holes, MD for 2nd opininion   Total time spent 35 minutes. Over half that time spent discussing above.    Glori Bickers, MD  4:37 PM

## 2021-10-11 NOTE — Progress Notes (Signed)
LVAD INR 

## 2021-10-11 NOTE — Patient Instructions (Signed)
No change in medication. Return to [place] clinic in 2 months.  Lauren will call you with INR and coumadin dosing.

## 2021-10-11 NOTE — Progress Notes (Addendum)
Patient presents for 2 mo f/u and 6 month Intermacs in [facility] today alone. Reports no problems with VAD equipment or concerns with drive line.  Patient says he is feeling "great" and has no limitations with activities. He has completed Cardiac Rehab and now is thinking about volunteering. VAD Coordinator will ask VAD patient and caregiver with volunteer experience here at [facility] to call patient (with his permission).  Patient denies any heart failure symptoms except for occasional dizziness when bending over. He does report SOB after walking @ one block level ground, but feels this may be related to long history of smoking.   Patient saw Dr. Lenna Gilford [facility] Rhematology) recently and was told not to re-start Remicade for his ancylosing spondylitis with possible increased risk of infection due to VAD drive line. Dr. [name] will discuss with [name] to see what their practice is and will contact patient if Remicade can be re-started safely.   Patient confirms he did start showers after last visit and reports no issues with drive line.   Patient has started wearing a football girdle to carry his controller and batteries and says it is working well for him.   Vital Signs:    Doppler Pressure: 98 Automatc BP:  97/85 (91)  HR:  80    SPO2:  100% on RA   Weight: 197.6 lb w/ eqt Last weight: 191 lb Ht: 6'     VAD Interrogation: Speed: 5800 Flow: 5.3 Power: 4.8w    PI: 3.3 Alarms: none Events: 40 - 50 PI events daily  Fixed speed: 5800 Low speed limit: 5500  Primary controller: back up battery due for replacement in 26 months Secondary controller:  back up battery due for replacement in 27 months    I reviewed the LVAD parameters from today and compared the results to the patient's prior recorded data. LVAD interrogation was NEGATIVE for significant power changes, NEGATIVE for clinical alarms and STABLE for PI events/speed drops. No programming changes were made and pump is functioning within  specified parameters. Pt is performing daily controller and system monitor self tests along with completing weekly and monthly maintenance for LVAD equipment.   LVAD equipment check completed and is in good working order. Back-up equipment  present.    Annual Equipment Maintenance on UBC/PM was performed on 12/22.   Exit Site Care: Existing VAD dressing and anchor removed and site care performed using sterile technique. Drive line exit site cleaned with Chlora prep applicators x 2, Rinsed w/saline, allowed to dry and Sorbaview  dressing with Silverlon applied and anchor replaced. Exit site healing and incorporated.The velour is fully implanted at exit site. Will continue weekly dressings.  Provided patient with 8 weekly kits.  Device: N/A  (removed due to infection 12/04/20)   BP & Labs:  Doppler 98 - Doppler is reflecting modified systolic   Hgb 66.4  - No S/S of bleeding. Specifically denies melena/BRBPR or nosebleeds.   LDH stable at 154 with established baseline of 135 - 309. Denies tea-colored urine. No power elevations noted on interrogation.   6 month Intermacs follow up completed including:  Quality of Life, KCCQ-12, and Neurocognitive trail making.   Pt unable to complete 6 minute walk due to chronic back and hip pain.   [place] Cardiomyopathy Questionnaire     10/11/2021    1:40 PM 06/28/2021   11:51 AM 11/15/2020    9:15 AM  KCCQ-12  1 a. Ability to shower/bathe Slightly limited Other, Did not do Not  at all limited  1 b. Ability to walk 1 block Slightly limited Slightly limited Slightly limited  1 c. Ability to hurry/jog Moderately limited Moderately limited Extremely limited  2. Edema feet/ankles/legs Never over the past 2 weeks Never over the past 2 weeks Never over the past 2 weeks  3. Limited by fatigue Less than once a week 1-2 times a week Several times a day  4. Limited by dyspnea 1-2 times a week 1-2 times a week 3+ times a week, not every day  5. Sitting up / on  3+ pillows Never over the past 2 weeks 1-2 times a week Never over the past 2 weeks  6. Limited enjoyment of life Slightly limited Moderately limited Limited quite a bit  7. Rest of life w/ symptoms Mostly satisfied Somewhat satisfied Not at all satisfied  8 a. Participation in hobbies N/A, did not do for other reasons Moderately limited Limited quite a bit  8 b. Participation in chores N/A, did not do for other reasons Slightly limited Moderately limited  8 c. Visiting family/friends N/A, did not do for other reasons Slightly limited Moderately limited   Back up controller: charged by patient at home.    Patient Goals: begin volunteer work, possibly here at the hospital. Contact person asked to call Mr. Saari with information.     Patient Instructions:  Return to [facility] in 2 months.  No change in coumadin dosing per Ander Purpura, PharmD. Pt will re-check INR next week with his home machine.    Zada Girt RN [place] Coordinator  Office: [phone]  24/7 Pager: [phone]

## 2021-10-18 ENCOUNTER — Ambulatory Visit (HOSPITAL_COMMUNITY): Payer: Self-pay | Admitting: Pharmacist

## 2021-10-18 ENCOUNTER — Other Ambulatory Visit: Payer: Self-pay | Admitting: Infectious Diseases

## 2021-10-18 LAB — POCT INR: INR: 2 (ref 2.0–3.0)

## 2021-10-18 NOTE — Progress Notes (Signed)
LVAD INR 

## 2021-10-25 ENCOUNTER — Ambulatory Visit (HOSPITAL_COMMUNITY): Payer: Self-pay | Admitting: Pharmacist

## 2021-10-25 LAB — POCT INR: INR: 1.6 — AB (ref 2.0–3.0)

## 2021-10-25 MED ORDER — WARFARIN SODIUM 2.5 MG PO TABS: ORAL_TABLET | ORAL | 11 refills | Status: DC

## 2021-10-25 NOTE — Progress Notes (Signed)
LVAD INR 

## 2021-10-27 ENCOUNTER — Other Ambulatory Visit (HOSPITAL_COMMUNITY): Payer: Self-pay

## 2021-11-01 ENCOUNTER — Ambulatory Visit (HOSPITAL_COMMUNITY): Payer: Self-pay | Admitting: Pharmacist

## 2021-11-01 ENCOUNTER — Ambulatory Visit: Payer: Self-pay

## 2021-11-01 DIAGNOSIS — I2510 Atherosclerotic heart disease of native coronary artery without angina pectoris: Secondary | ICD-10-CM

## 2021-11-01 DIAGNOSIS — Z95811 Presence of heart assist device: Secondary | ICD-10-CM

## 2021-11-01 DIAGNOSIS — I10 Essential (primary) hypertension: Secondary | ICD-10-CM

## 2021-11-01 DIAGNOSIS — I5042 Chronic combined systolic (congestive) and diastolic (congestive) heart failure: Secondary | ICD-10-CM

## 2021-11-01 LAB — POCT INR: INR: 2 (ref 2.0–3.0)

## 2021-11-01 NOTE — Chronic Care Management (AMB) (Cosign Needed)
Chronic Care Management   CCM RN Visit Note  11/01/2021 Name: Brandon [name] MRN: [id number] DOB: 01/13/1956  Subjective: Brandon [name] is a 66 y.o. year old male who is a primary care patient of [name], MD. The care management team was consulted for assistance with disease management and care coordination needs.    Engaged with patient by telephone for follow up visit in response to provider referral for case management and/or care coordination services.   Consent to Services:  The patient was given information about Chronic Care Management services, agreed to services, and gave verbal consent prior to initiation of services.  Please see initial visit note for detailed documentation.   Patient agreed to services and verbal consent obtained.   Assessment: Review of patient past medical history, allergies, medications, health status, including review of consultants reports, laboratory and other test data, was performed as part of comprehensive evaluation and provision of chronic care management services.   SDOH (Social Determinants of Health) assessments and interventions performed:    CCM Care Plan  No Known Allergies  Outpatient Encounter Medications as of 11/01/2021  Medication Sig   acetaminophen (TYLENOL) 500 MG tablet Take 1,000 mg by mouth every 6 (six) hours as needed (pain.).   atorvastatin (LIPITOR) 80 MG tablet TAKE 1 TABLET BY MOUTH ONCE DAILY AT 6PM.   Carboxymethylcellulose Sodium (ARTIFICIAL TEARS OP) Place 1 drop into both eyes daily as needed (for dryness or irritation).   eplerenone (INSPRA) 50 MG tablet Take 1 tablet (50 mg total) by mouth in the morning.   ezetimibe (ZETIA) 10 MG tablet Take 1 tablet (10 mg total) by mouth daily.   gabapentin (NEURONTIN) 300 MG capsule Take 1 capsule (300 mg total) by mouth 3 (three) times daily.   losartan (COZAAR) 25 MG tablet Take 1/2 tablet (12.5 mg total) by mouth daily.   melatonin 5 MG TABS Take 1 tablet (5 mg  total) by mouth at bedtime. (Patient taking differently: Take 10 mg by mouth 3 (three) times a week.)   nystatin ointment (MYCOSTATIN) Apply 1 application topically 2 (two) times daily. (Patient not taking: Reported on 10/11/2021)   ondansetron (ZOFRAN) 4 MG tablet Take 1 tablet (4 mg total) by mouth every 8 (eight) hours as needed for nausea or vomiting. (Patient not taking: Reported on 08/09/2021)   pantoprazole (PROTONIX) 40 MG tablet Take 1 tablet (40 mg total) by mouth daily.   potassium chloride SA (KLOR-CON M) 20 MEQ tablet Take 1 tablet (20 mEq total) by mouth daily.   sertraline (ZOLOFT) 100 MG tablet Take 1 tablet (100 mg total) by mouth daily.   tamsulosin (FLOMAX) 0.4 MG CAPS capsule Take 1 capsule (0.4 mg total) by mouth daily.   torsemide (DEMADEX) 20 MG tablet Take 1 tablet (20 mg total) by mouth as needed. (Patient not taking: Reported on 08/09/2021)   traMADol (ULTRAM) 50 MG tablet Take 1 tablet (50 mg total) by mouth every 6 (six) hours as needed for moderate pain. (Patient taking differently: Take 100 mg by mouth 2 (two) times daily as needed for moderate pain.)   warfarin (COUMADIN) 2.5 MG tablet Take 7.5 mg (3 tablets) every Monday/Friday and 5 mg (2 tablets) all other days or as instructed by LVAD clinic.   No facility-administered encounter medications on file as of 11/01/2021.    Patient Active Problem List   Diagnosis Date Noted   Long term (current) use of anticoagulants 05/15/2021   LVAD (left ventricular assist device) present (  [place]) 04/06/2021   Protein-calorie malnutrition, severe 03/14/2021   ILD (interstitial lung disease) ([place])    Ankylosing spondylitis (HCC)    Chronic systolic CHF (congestive heart failure) ([place]) 03/07/2021   Acute on chronic systolic (congestive) heart failure ([place]) 01/08/2021   Dental caries    Malnutrition of moderate degree 12/01/2020   Pressure injury of skin 12/01/2020   Cholelithiases    Aortic valve endocarditis    Elevated brain  natriuretic peptide (BNP) level 11/30/2020   Elevated troponin 11/30/2020   Hypokalemia 11/30/2020   Hypomagnesemia 11/30/2020   Normocytic anemia 11/30/2020   Dyspnea    Infected defibrillator ([place])    Bacteremia    Central line infection    Acute respiratory failure with hypoxia and hypercapnia ([place]) 11/29/2020   Centrilobular emphysema ([place]) 08/09/2020   Benign neoplasm of colon    Heart failure ([place]) 12/31/2019   Ischemic cardiomyopathy 08/31/2019   STEMI (ST elevation myocardial infarction) ([place]) 04/27/2019   Acute anterior wall MI ([place]) 04/27/2019   Mild aortic stenosis 12/02/2012   Ankylosis of sacroiliac joint 03/08/2011   TOBACCO ABUSE 03/13/2009   Hyperlipidemia with target LDL less than 70 06/07/2008   Essential hypertension 06/07/2008   CAD (coronary artery disease) 06/07/2008   BENIGN PROSTATIC HYPERTROPHY 06/07/2008   History of colon polyps 06/07/2008   NEPHROLITHIASIS, HX OF 06/07/2008    Conditions to be addressed/monitored:CHF, CAD, HTN, and HLD  Care Plan : RN Care Manager Plan of Care  Updates made by Dimitri Ped, RN since 11/01/2021 12:00 AM  Completed 11/01/2021   Problem: Chronic Disease Management and Care Coordination Needs (LVAD, CAD, CHF,HTN,  HLD, ankylosing spondylitis) Resolved 11/01/2021  Priority: High     Long-Range Goal: Establish Plan of Care for Chronic Disease Management Needs (LVAD, CAD, CHF,HTN,  HLD, ankylosing spondylitis) Completed 11/01/2021  Start Date: 05/29/2021  Expected End Date: 10/01/2022  Priority: High  Note:   Case closed goals met Current Barriers:  Knowledge Deficits related to plan of care for management of CHF, CAD, HTN, HLD, and LVAD, ankylosing spondylitis  Chronic Disease Management support and education needs related to CHF, CAD, HTN, HLD, and LVAD, ankylosing spondylitis  States that he has been feeling good and continues to get stronger.  States he completed  cardiac rehab and he is now going to [place] to  exercise.  States he plans to go every other day to exercise States  his LVAD site has  no signs of infection. States his neighbor changes his LVAD dressing weekly.  States he is to see the LVAD clinic on 10/11/21.  Denies any chest pain or swelling.  States he has some shortness of breath with exertion but it has improved with his cardiac rehab. States he is weighing daily and he weighed 196.5 today.  States he trying to eat healthy and he is back to his weight before he got sick.  States he still  some back and shoulder pain and states he wants to get back on the Remicade when his rheumatologist approves him to restart.     RNCM Clinical Goal(s):  Patient will verbalize understanding of plan for management of CHF, CAD, HTN, HLD, and LVAD, ankylosing spondylitis as evidenced by voiced adherence to plan of care verbalize basic understanding of  CHF, CAD, HTN, HLD, and LVAD, ankylosing spondylitis disease process and self health management plan as evidenced by voiced understanding and teach back take all medications exactly as prescribed and will call provider for medication related questions as  evidenced by dispense report and pt verbalization attend all scheduled medical appointments: Annual Wellness Visit 07/22/22, VAD clinic 10/11/21 as evidenced by medical records demonstrate Improved adherence to prescribed treatment plan for CHF, CAD, HTN, HLD, and LVAD, ankylosing spondylitis as evidenced by readings within limits, voiced adherence to plan of care continue to work with RN Care Manager to address care management and care coordination needs related to  CHF, CAD, HTN, HLD, and LVAD, ankylosing spondylitis as evidenced by adherence to CM Team Scheduled appointments through collaboration with RN Care manager, provider, and care team.   Interventions: 1:1 collaboration with primary care provider regarding development and update of comprehensive plan of care as evidenced by provider attestation and  co-signature Inter-disciplinary care team collaboration (see longitudinal plan of care) Evaluation of current treatment plan related to  self management and patient's adherence to plan as established by provider  LVAD  (Status:  Goal Met.)  Long Term Goal Evaluation of current treatment plan related to  LVAD  ,  LVAD  self-management and patient's adherence to plan as established by provider. Discussed plans with patient for ongoing care management follow up and provided patient with direct contact information for care management team Evaluation of current treatment plan related to LVAD and patient's adherence to plan as established by provider Provided education to patient re: LVAD  Reviewed to call LVAD clinic for any sign of infection of LVAD site. Reviewed to continue exercise at [facility] and to try to be as active as tolerated. Reinforced to follow a low sodium well balanced diet with protein to help build up muscle  Pain /ankylosing spondylitisInterventions:  (Status:  Goal Met.) Long Term Goal Pain assessment performed Medications reviewed Reviewed provider established plan for pain management Discussed importance of adherence to all scheduled medical appointments Counseled on the importance of reporting any/all new or changed pain symptoms or management strategies to pain management provider Advised patient to report to care team affect of pain on daily activities Discussed use of relaxation techniques and/or diversional activities to assist with pain reduction (distraction, imagery, relaxation, massage, acupressure, TENS, heat, and cold application Reinforced to follow up with his rheumatology to discuss resuming his Remicade    CAD Interventions: (Status:  Goal Met.) Long Term Goal Assessed understanding of CAD diagnosis Medications reviewed including medications utilized in CAD treatment plan Counseled on importance of regular laboratory monitoring as prescribed Counseled on the  importance of exercise goals with target of 150 minutes per week Reviewed Importance of taking all medications as prescribed Reviewed Importance of attending all scheduled provider appointments Advised to report any changes in symptoms or exercise tolerance   Heart Failure Interventions:  (Status:  Goal Met.) Long Term Goal Provided education on low sodium diet Reviewed Heart Failure Action Plan in depth and provided written copy Discussed importance of daily weight and advised patient to weigh and record daily Discussed the importance of keeping all appointments with provider Provided patient with education about the role of exercise in the management of heart failure   Hyperlipidemia Interventions:  (Status:  Goal Met.) Long Term Goal Medication review performed; medication list updated in electronic medical record.  Provider established cholesterol goals reviewed Counseled on importance of regular laboratory monitoring as prescribed Reviewed role and benefits of statin for ASCVD risk reduction Reviewed importance of limiting foods high in cholesterol  Hypertension Interventions:  (Status:  Goal Met.) Long Term Goal Last practice recorded BP readings:  BP Readings from Last 3 Encounters:  09/12/21 114/85  08/09/21 103/71  07/26/21 (!) 76/0  Most recent eGFR/CrCl: No results found for: EGFR  No components found for: CRCL  Evaluation of current treatment plan related to hypertension self management and patient's adherence to plan as established by provider Provided education to patient re: stroke prevention, s/s of heart attack and stroke Reviewed medications with patient and discussed importance of compliance Counseled on the importance of exercise goals with target of 150 minutes per week Screening for signs and symptoms of depression related to chronic disease state   Surgery (LVAD insertion):  (Status: Goal Met.) Short Term Goal Evaluation of current treatment plan related to  LVAD surgery assessed patient/caregiver understanding of surgical procedure   reviewed post-operative instructions with patient/caregiver addressed questions about post - surgical incision care  Reinforced s/sx of infection of insertion site to call provider. Reinforced to try to eat a well balanced low sodium diet with protein to help with healing  Patient Goals/Self-Care Activities: Take all medications as prescribed Attend all scheduled provider appointments Call pharmacy for medication refills 3-7 days in advance of running out of medications Perform all self care activities independently  Call provider office for new concerns or questions  call office if I gain more than 2 pounds in one day or 5 pounds in one week keep legs up while sitting watch for swelling in feet, ankles and legs every day weigh myself daily follow rescue plan if symptoms flare-up eat more whole grains, fruits and vegetables, lean meats and healthy fats keep all doctor appointments call for medicine refill 2 or 3 days before it runs out take all medications exactly as prescribed call doctor with any symptoms you believe are related to your medicine  Follow Up Plan:  The patient has been provided with contact information for the care management team and has been advised to call with any health related questions or concerns.  No further follow up required: Case closed goals met       Plan:The patient has been provided with contact information for the care management team and has been advised to call with any health related questions or concerns.  No further follow up required: Case closed goals met Jubal Garter RN, [facility], CDE Care Management Coordinator [facility] [phone]

## 2021-11-01 NOTE — Patient Instructions (Signed)
Visit Information  Thank you for allowing me to share the care management and care coordination services that are available to you as part of your health plan and services through your primary care provider and medical home. Please reach out to me at [phone] if the care management/care coordination team may be of assistance to you in the future.   Rhen Garter RN, [facility], CDE Care Management Coordinator [facility] [phone]

## 2021-11-02 ENCOUNTER — Ambulatory Visit (INDEPENDENT_AMBULATORY_CARE_PROVIDER_SITE_OTHER): Payer: Medicare Other | Admitting: Family Medicine

## 2021-11-02 VITALS — HR 88 | Temp 98.7°F | Wt 201.4 lb

## 2021-11-02 DIAGNOSIS — I2510 Atherosclerotic heart disease of native coronary artery without angina pectoris: Secondary | ICD-10-CM

## 2021-11-02 DIAGNOSIS — Z95811 Presence of heart assist device: Secondary | ICD-10-CM

## 2021-11-02 DIAGNOSIS — M456 Ankylosing spondylitis lumbar region: Secondary | ICD-10-CM | POA: Diagnosis not present

## 2021-11-02 NOTE — Progress Notes (Signed)
Subjective:    Patient ID: Brandon [name], male    DOB: 07-07-1955, 66 y.o.   MRN: [id number]  Chief Complaint  Patient presents with   Medication Refill    Tramodol Rx. For stiffness. Waiting on cardio to see if he has talked to [facility]. Cardio is stopping controlled medications.     HPI Patient was seen today for f/u.  Pt seen by Rheumatology for anklyosing spondylitis.  Previously on Remicad without issue and would like to resume treatment.  Currently has LVAD in place.  Per pt, given ok to restart Remicad by Cards, but Rheumatology is not ok with restarting.  Pt also concerned as Rheum office to stop filling controlled substances in the next few months.  Pt states taking tramadol as needed for stiffness, not so much for pain.  States stiffness is much better when on Remicaide.    Past Medical History:  Diagnosis Date   AICD (automatic cardioverter/defibrillator) present 08/31/2019   Ankylosing spondylitis ([place])    Arthritis    BENIGN PROSTATIC HYPERTROPHY 06/07/2008   CHF (congestive heart failure) ([place])    COLONIC POLYPS, HX OF 06/07/2008   Coronary artery disease    Depression    H/O hiatal hernia    Heart failure ([place])    HYPERLIPIDEMIA 06/07/2008   HYPERTENSION 06/07/2008   Myocardial infarction [facility]) 2005   NSTEMI, s/p LAD stent   NEPHROLITHIASIS, HX OF 06/07/2008   STEMI (ST elevation myocardial infarction) ([place]) 04/27/2019    No Known Allergies  ROS General: Denies fever, chills, night sweats, changes in weight, changes in appetite HEENT: Denies headaches, ear pain, changes in vision, rhinorrhea, sore throat CV: Denies CP, palpitations, SOB, orthopnea Pulm: Denies SOB, cough, wheezing GI: Denies abdominal pain, nausea, vomiting, diarrhea, constipation GU: Denies dysuria, hematuria, frequency Msk: Denies muscle cramps, joint pains  +lumbar spine stiffness Neuro: Denies weakness, numbness, tingling Skin: Denies rashes, bruising Psych: Denies depression, anxiety,  hallucinations     Objective:    Pulse 88, temperature 98.7 F (37.1 C), temperature source Oral, weight 201 lb 6.4 oz (91.4 kg), SpO2 96 %.  Gen. Pleasant, well-nourished, in no distress, normal affect   HEENT: [place]/AT, face symmetric, conjunctiva clear, no scleral icterus, PERRLA, EOMI, nares patent without drainage Lungs: no accessory muscle use, CTAB, no wheezes or rales Cardiovascular: Mechanical LVAD hum, no peripheral edema Musculoskeletal: No deformities, no cyanosis or clubbing, normal tone Neuro:  A&Ox3, CN II-XII intact, normal gait Skin:  Warm, no lesions/ rash   Wt Readings from Last 3 Encounters:  11/02/21 201 lb 6.4 oz (91.4 kg)  10/11/21 197 lb 9.6 oz (89.6 kg)  09/17/21 197 lb 12 oz (89.7 kg)    Lab Results  Component Value Date   WBC 7.1 10/11/2021   HGB 13.1 10/11/2021   HCT 42.8 10/11/2021   PLT 221 10/11/2021   GLUCOSE 108 (H) 10/11/2021   CHOL 122 02/06/2021   TRIG 124 02/06/2021   HDL 37 (L) 02/06/2021   LDLCALC 60 02/06/2021   ALT 20 10/11/2021   AST 22 10/11/2021   NA 139 10/11/2021   K 4.6 10/11/2021   CL 106 10/11/2021   CREATININE 0.94 10/11/2021   BUN 12 10/11/2021   CO2 27 10/11/2021   TSH 0.917 02/06/2021   PSA 0.96 04/15/2019   INR 2.0 11/01/2021   HGBA1C 6.0 (H) 04/07/2021    Assessment/Plan:  LVAD (left ventricular assist device) present ([place]) -stable -Continue current medications -continue f/u with Cardiology, Dr. [name]  Ankylosing  spondylitis of lumbar region [facility]) -Advised Tramadol more for pain rather than stiffness.   -As using sparingly can take over rx if needed as Rheumatology office no longer rx'ing controlled meds starting in the next few months. -PDMP reviewed -continue f/u with Dr. Ardean Larsen scientific affairs contacted by this provider in regards to remicad use with LVAD.  Per Pharmacist " 17 minutes spent on phone  F/u prn  Grier Mitts, MD

## 2021-11-03 ENCOUNTER — Other Ambulatory Visit: Payer: Self-pay | Admitting: Family Medicine

## 2021-11-08 ENCOUNTER — Ambulatory Visit (HOSPITAL_COMMUNITY): Payer: Self-pay | Admitting: Pharmacist

## 2021-11-08 LAB — POCT INR: INR: 2 (ref 2.0–3.0)

## 2021-11-08 NOTE — Progress Notes (Signed)
LVAD INR 

## 2021-11-15 ENCOUNTER — Ambulatory Visit (HOSPITAL_COMMUNITY): Payer: Self-pay | Admitting: Pharmacist

## 2021-11-15 LAB — POCT INR: INR: 2 (ref 2.0–3.0)

## 2021-11-15 NOTE — Progress Notes (Signed)
LVAD INR 

## 2021-11-19 ENCOUNTER — Encounter: Payer: Self-pay | Admitting: Family Medicine

## 2021-11-22 ENCOUNTER — Ambulatory Visit (HOSPITAL_COMMUNITY): Payer: Self-pay | Admitting: Pharmacist

## 2021-11-22 LAB — POCT INR: INR: 2.1 (ref 2.0–3.0)

## 2021-11-22 NOTE — Progress Notes (Signed)
LVAD INR 

## 2021-11-23 ENCOUNTER — Other Ambulatory Visit (HOSPITAL_COMMUNITY): Payer: Self-pay

## 2021-11-29 ENCOUNTER — Encounter (HOSPITAL_COMMUNITY): Payer: Medicare Other

## 2021-11-29 ENCOUNTER — Ambulatory Visit (HOSPITAL_COMMUNITY): Payer: Self-pay | Admitting: Pharmacist

## 2021-11-29 ENCOUNTER — Encounter (HOSPITAL_COMMUNITY): Payer: Self-pay

## 2021-11-29 LAB — POCT INR: INR: 2.1 (ref 2.0–3.0)

## 2021-11-29 NOTE — Progress Notes (Signed)
LVAD INR 

## 2021-12-06 ENCOUNTER — Ambulatory Visit (HOSPITAL_COMMUNITY): Payer: Self-pay | Admitting: Pharmacist

## 2021-12-06 LAB — POCT INR: INR: 1.8 — AB (ref 2.0–3.0)

## 2021-12-06 NOTE — Progress Notes (Signed)
LVAD INR 

## 2021-12-11 ENCOUNTER — Encounter (HOSPITAL_COMMUNITY): Payer: Medicare Other

## 2021-12-12 ENCOUNTER — Other Ambulatory Visit (HOSPITAL_COMMUNITY): Payer: Self-pay | Admitting: *Deleted

## 2021-12-12 DIAGNOSIS — Z7901 Long term (current) use of anticoagulants: Secondary | ICD-10-CM

## 2021-12-12 DIAGNOSIS — I5022 Chronic systolic (congestive) heart failure: Secondary | ICD-10-CM

## 2021-12-12 DIAGNOSIS — Z95811 Presence of heart assist device: Secondary | ICD-10-CM

## 2021-12-13 ENCOUNTER — Ambulatory Visit (HOSPITAL_COMMUNITY): Payer: Self-pay | Admitting: Pharmacist

## 2021-12-13 LAB — POCT INR: INR: 1.8 — AB (ref 2.0–3.0)

## 2021-12-13 NOTE — Progress Notes (Signed)
LVAD INR 

## 2021-12-17 ENCOUNTER — Ambulatory Visit (HOSPITAL_COMMUNITY): Payer: Self-pay | Admitting: Pharmacist

## 2021-12-17 ENCOUNTER — Ambulatory Visit (HOSPITAL_COMMUNITY)
Admission: RE | Admit: 2021-12-17 | Discharge: 2021-12-17 | Disposition: A | Discharge status: Home / Self Care | Payer: Medicare Other | Source: Ambulatory Visit | Attending: Internal Medicine | Admitting: Internal Medicine

## 2021-12-17 VITALS — BP 88/0 | HR 87 | Ht 72.0 in | Wt 204.4 lb

## 2021-12-17 DIAGNOSIS — I252 Old myocardial infarction: Secondary | ICD-10-CM | POA: Insufficient documentation

## 2021-12-17 DIAGNOSIS — I480 Paroxysmal atrial fibrillation: Secondary | ICD-10-CM

## 2021-12-17 DIAGNOSIS — M459 Ankylosing spondylitis of unspecified sites in spine: Secondary | ICD-10-CM | POA: Diagnosis not present

## 2021-12-17 DIAGNOSIS — I5022 Chronic systolic (congestive) heart failure: Secondary | ICD-10-CM

## 2021-12-17 DIAGNOSIS — Z953 Presence of xenogenic heart valve: Secondary | ICD-10-CM | POA: Diagnosis not present

## 2021-12-17 DIAGNOSIS — Z7901 Long term (current) use of anticoagulants: Secondary | ICD-10-CM

## 2021-12-17 DIAGNOSIS — I2510 Atherosclerotic heart disease of native coronary artery without angina pectoris: Secondary | ICD-10-CM

## 2021-12-17 DIAGNOSIS — Z95811 Presence of heart assist device: Secondary | ICD-10-CM

## 2021-12-17 DIAGNOSIS — I110 Hypertensive heart disease with heart failure: Secondary | ICD-10-CM | POA: Diagnosis present

## 2021-12-17 DIAGNOSIS — Z952 Presence of prosthetic heart valve: Secondary | ICD-10-CM

## 2021-12-17 DIAGNOSIS — Z9889 Other specified postprocedural states: Secondary | ICD-10-CM | POA: Insufficient documentation

## 2021-12-17 DIAGNOSIS — F4321 Adjustment disorder with depressed mood: Secondary | ICD-10-CM | POA: Diagnosis not present

## 2021-12-17 DIAGNOSIS — R6889 Other general symptoms and signs: Secondary | ICD-10-CM | POA: Insufficient documentation

## 2021-12-17 DIAGNOSIS — Z79899 Other long term (current) drug therapy: Secondary | ICD-10-CM | POA: Diagnosis not present

## 2021-12-17 LAB — CBC
HCT: 44.9 % (ref 39.0–52.0)
Hemoglobin: 14.3 g/dL (ref 13.0–17.0)
MCH: 26.7 pg (ref 26.0–34.0)
MCHC: 31.8 g/dL (ref 30.0–36.0)
MCV: 83.8 fL (ref 80.0–100.0)
Platelets: 209 10*3/uL (ref 150–400)
RBC: 5.36 MIL/uL (ref 4.22–5.81)
RDW: 17.7 % — ABNORMAL HIGH (ref 11.5–15.5)
WBC: 7.6 10*3/uL (ref 4.0–10.5)
nRBC: 0 % (ref 0.0–0.2)

## 2021-12-17 LAB — PROTIME-INR
INR: 2.1 — ABNORMAL HIGH (ref 0.8–1.2)
Prothrombin Time: 23.5 seconds — ABNORMAL HIGH (ref 11.4–15.2)

## 2021-12-17 LAB — LACTATE DEHYDROGENASE: LDH: 236 U/L — ABNORMAL HIGH (ref 98–192)

## 2021-12-17 LAB — BASIC METABOLIC PANEL
Anion gap: 9 (ref 5–15)
BUN: 16 mg/dL (ref 8–23)
CO2: 26 mmol/L (ref 22–32)
Calcium: 9.8 mg/dL (ref 8.9–10.3)
Chloride: 101 mmol/L (ref 98–111)
Creatinine, Ser: 1.1 mg/dL (ref 0.61–1.24)
GFR, Estimated: >60 mL/min (ref >60)
Glucose, Bld: 98 mg/dL (ref 70–99)
Potassium: 4.8 mmol/L (ref 3.5–5.1)
Sodium: 136 mmol/L (ref 135–145)

## 2021-12-17 NOTE — Progress Notes (Signed)
Patient presents for 2 mo f/u  in [facility] today alone. Reports no problems with VAD equipment or concerns with drive line.  Patient says he is feeling "great" and has no limitations with activities. He has completed Cardiac Rehab and now is going to the [employer] 3x a week. Pt tells me that he is taking a Torsemide on the days he goes to the gym. He states this helps him feel better and helps with the SOB.  Pt is asking today if he can stop his Zoloft. He discussed this with DR Bensimhon and pt was given weaning instructions.  Patient saw Dr. Lenna Gilford [facility] Rhematology) recently and was told not to re-start Remicade for his ancylosing spondylitis with possible increased risk of infection due to VAD drive line. Dr. [name] d/w pt today that if he wishes to start Remicade that he will need to go to [facility]. Pt wants to table this for now as he is not having any current issues. If pt desires to restart VAD coordinator will need to reach out to [place] at Christmas to see which Rheumatologist is VAD knowleable at [facility].   Pt tells me that [name] is concerned over a scab at his driveline site. Driveline changed in clinic today. See below for full documentation.  Vital Signs:    Doppler Pressure: 88 Automatc BP:  106/72 (86)  HR:  87    SPO2:  96% on RA   Weight: 204.4 lb w/ eqt Last weight: 197.6 lb Ht: 6'     VAD Interrogation: Speed: 5800 Flow: 5.2 Power: 4.9w    PI: 3.3 Alarms: none Events: 40 - 60 PI events daily  Fixed speed: 5800 Low speed limit: 5500  Primary controller: back up battery due for replacement in 24 months Secondary controller:  back up battery due for replacement in 25 months    I reviewed the LVAD parameters from today and compared the results to the patient's prior recorded data. LVAD interrogation was NEGATIVE for significant power changes, NEGATIVE for clinical alarms and STABLE for PI events/speed drops. No programming changes were made and pump is functioning  within specified parameters. Pt is performing daily controller and system monitor self tests along with completing weekly and monthly maintenance for LVAD equipment.   LVAD equipment check completed and is in good working order. Back-up equipment  present.    Annual Equipment Maintenance on UBC/PM was performed on 12/22.   Exit Site Care: Existing VAD dressing and anchor removed and site care performed using sterile technique. Drive line exit site cleaned with Chlora prep applicators x 2, Rinsed w/saline, allowed to dry and Sorbaview  dressing with Silverlon applied and anchor replaced. Exit site healing and incorporated.The velour is slightly visible at exit site. There is a tiny bit of dried scab on the velour. Will continue weekly dressings.  Provided patient with 8 weekly kits.  Device: N/A  (removed due to infection 12/04/20)   BP & Labs:  Doppler 88 - Doppler is reflecting modified systolic   Hgb 87.5  - No S/S of bleeding. Specifically denies melena/BRBPR or nosebleeds.   LDH stable at 236 with established baseline of 135 - 309. Denies tea-colored urine. No power elevations noted on interrogation.     Patient Instructions:  Decrease Zoloft to 50 mg for 2 weeks. After 2 weeks, decrease to every other day for 2 weeks and then stop if you have tolerated the wean. Return to [facility] in 2 months.  No change in  coumadin dosing per Ander Purpura, PharmD. Pt will re-check INR next week with his home machine.    Tanda Rockers RN [place] Coordinator  Office: [phone]  24/7 Pager: [phone]

## 2021-12-17 NOTE — Progress Notes (Signed)
LVAD INR 

## 2021-12-17 NOTE — Patient Instructions (Addendum)
Decrease Zoloft to 50 mg for 2 weeks. After 2 weeks, decrease to every other day for 2 weeks and then stop if you have tolerated the wean. Return to [facility] in 2 months.  No change in coumadin dosing per Ander Purpura, PharmD. Pt will re-check INR next week with his home machine.

## 2021-12-17 NOTE — Progress Notes (Addendum)
LVAD Follow-up Clinic Note  PCP: [name], MD HF MD: DB   HPI:  Brandon [name] is a 66 year old male with history of tobacco use, CAD s/p previous anterior MI, severe systolic HF s/p HM-3 VAD 93/26/71, severe AS s/p AVR, endocarditis.    Admitted 12/20 with anterior ST elevation MI. Cath with chronically occluded RCA (L>>R collaterals) and thrombotic occlusion of proximal LAD treated with PCI. Echo w/ reduced LVEF 30-35% w/ apical aneurysm. RV ok. Post cath, he required milrinone for cardiogenic shock. Subsequently underwent Barostim.   Underwent outpatient [facility] 11/29/20 as part of VAD w/u. Post procedure, developed severe rigors and fever. Admitted to ICU Blood Cx + strep. 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  Likely low-flow low gradient AS. Underwent ICD extraction..    Admitted for cardiogenic shock in 10/22.  He underwent placement of HM3 VAD and aortic valve replacement with 25 mm Edwards Inspiris Resilia valve on 04/06/21. Surgical path came back 11/28 with evidence of possible "acute aortic valve endocarditis". 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 Discharged 05/09/21.    Follow up for Heart Failure/LVAD: Here for routine f/u. Feels great. Very active. Says he gets a little bit of chest tightness when he exercises but if he takes a torsemide before going to the gym it doesn't happen. Denies orthopnea or PND. No fevers, chills or problems with driveline. No bleeding, melena or neuro symptoms. No VAD alarms. Taking all meds as prescribed.      VAD Interrogation: Speed: 5800 Flow: 5.2 Power: 4.9w    PI: 3.3 Alarms: none Events: 40 - 60 PI events daily  Fixed speed: 5800 Low speed limit: 5500   Primary controller: back up battery due for  replacement in 24 months Secondary controller:  back up battery due for replacement in 25 months    I reviewed the LVAD parameters from today and compared the results to the patient's prior recorded data. LVAD interrogation was NEGATIVE for significant power changes, NEGATIVE for clinical alarms and STABLE for PI events/speed drops. No programming changes were made and pump is functioning within specified parameters. Pt is performing daily controller and system monitor self tests along with completing weekly and monthly maintenance for LVAD equipment.   LVAD equipment check completed and is in good working order. Back-up equipment  present.    Annual Equipment Maintenance on UBC/PM was performed on 12/22.      Past Medical History:  Diagnosis Date   AICD (automatic cardioverter/defibrillator) present 08/31/2019   Ankylosing spondylitis ([place])    Arthritis    BENIGN PROSTATIC HYPERTROPHY 06/07/2008   CHF (congestive heart failure) ([place])    COLONIC POLYPS, HX OF 06/07/2008   Coronary artery disease    Depression    H/O hiatal hernia    Heart failure ([place])    HYPERLIPIDEMIA 06/07/2008   HYPERTENSION 06/07/2008   Myocardial infarction [facility]) 2005   NSTEMI, s/p LAD stent   NEPHROLITHIASIS, HX OF 06/07/2008   STEMI (ST elevation myocardial infarction) ([place]) 04/27/2019    Current Outpatient Medications  Medication Sig Dispense Refill   acetaminophen (TYLENOL) 500 MG tablet Take 1,000 mg by mouth every 6 (six) hours as needed (pain.).     atorvastatin (LIPITOR) 80 MG tablet TAKE 1 TABLET BY MOUTH ONCE DAILY AT 6PM. 90 tablet  3   Carboxymethylcellulose Sodium (ARTIFICIAL TEARS OP) Place 1 drop into both eyes daily as needed (for dryness or irritation).     eplerenone (INSPRA) 50 MG tablet Take 1 tablet (50 mg total) by mouth in the morning. 30 tablet 6   ezetimibe (ZETIA) 10 MG tablet Take 1 tablet (10 mg total) by mouth daily. 90 tablet 3   gabapentin (NEURONTIN) 300 MG capsule Take 1  capsule (300 mg total) by mouth 3 (three) times daily. 90 capsule 5   losartan (COZAAR) 25 MG tablet Take 1/2 tablet (12.5 mg total) by mouth daily. 30 tablet 5   melatonin 5 MG TABS Take 1 tablet (5 mg total) by mouth at bedtime. (Patient taking differently: Take 10 mg by mouth once a week.) 30 tablet 6   pantoprazole (PROTONIX) 40 MG tablet Take 1 tablet (40 mg total) by mouth daily. 30 tablet 6   potassium chloride SA (KLOR-CON M) 20 MEQ tablet Take 1 tablet (20 mEq total) by mouth daily. 90 tablet 3   sertraline (ZOLOFT) 100 MG tablet Take 1 tablet (100 mg total) by mouth daily. 90 tablet 3   tamsulosin (FLOMAX) 0.4 MG CAPS capsule Take 1 capsule (0.4 mg total) by mouth daily. 90 capsule 3   torsemide (DEMADEX) 20 MG tablet Take 1 tablet (20 mg total) by mouth as needed. 30 tablet 5   traMADol (ULTRAM) 50 MG tablet TAKE 1-2 TABLETS BY MOUTH TWICE A DAY FOR 30 DAYS 120 tablet 0   warfarin (COUMADIN) 2.5 MG tablet Take 7.5 mg (3 tablets) every Monday/Friday and 5 mg (2 tablets) all other days or as instructed by LVAD clinic. 180 tablet 11   nystatin ointment (MYCOSTATIN) Apply 1 application topically 2 (two) times daily. (Patient not taking: Reported on 10/11/2021) 30 g 3   ondansetron (ZOFRAN) 4 MG tablet Take 1 tablet (4 mg total) by mouth every 8 (eight) hours as needed for nausea or vomiting. (Patient not taking: Reported on 08/09/2021) 20 tablet 3   No current facility-administered medications for this encounter.    Patient has no known allergies.    Vitals:   12/17/21 1038 12/17/21 1039  BP: 106/72 (!) 88/0  Pulse: 87   SpO2: 96%   Weight: 92.7 kg (204 lb 6.4 oz)   Height: 6' (1.829 m)      Vital Signs:     Doppler Pressure: 88 Automatc BP:  106/72 (86)      HR:  87                          SPO2:  96% on RA   Weight: 204.4 lb w/ eqt Last weight: 197.6 lb Ht: 6'  Physical Exam: General:  NAD.  HEENT: normal  Neck: supple. JVP not elevated.  Carotids 2+ bilat; no bruits.  No lymphadenopathy or thryomegaly appreciated. Cor: LVAD hum.  Lungs: Clear. Abdomen: soft, nontender, non-distended. No hepatosplenomegaly. No bruits or masses. Good bowel sounds. Driveline site clean. Anchor in place.  Extremities: no cyanosis, clubbing, rash. Warm no edema  Neuro: alert & oriented x 3. No focal deficits. Moves all 4 without problem     ASSESSMENT AND PLAN:  1. Chronic systolic HFr EF due to iCM - Echo 05/19/20 EF 20-25% RV mildly HK. moderate AS  Mean gradient 13 AVA 1.2 cm2 DI 0.30 - s/p HM-III VAD + bioprosthetic AVR 04/06/21 - Doing great. NYHA I  - Orthostasis resolved. Takes torsemide before going to  the gym. Told him to be careful not to overdo diuresis.  - Continue Losartan 12.5 mg daily - Continue eplerenone 12.5 mg daily - has completed CR   2. HM-3 LVAD implant - VAD interrogated personally. Parameters stable. - ASA stopped 12/26 - INR 2.1 Goal 2.0-3.0 Discussed dosing with PharmD personally. - LDH 236 - Hgb 14.3 - MAPs ok - DL site ok   3. Severe low-flow aortic stenosis s/p AVR - s/p VAD/bioprosthetic AVR on 11/25   4. CAD  - s/p anterior STEMI (12/20). LHC showed chronically occluded RCA (with L>>R collaterals) and thrombotic occlusion of proximal LAD. Underwent PCI of LAD. - Now s/p VAD - Has occasional chest tightness with exercise but resolves with torsemide. Doubt anginal  - Continue atorvastatin 80.  - off ASA.    5. Paroxysmal Atrial Fibrillation w/ RVR - Remains in NSR  - Off amio - on warfarin.    6. 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  - Bcx recollected and pending  - Tissue sent to [facility] in [place] for broad 16 S ribosomal sequencing for bacterial pathogens as well as T wet Bhilai Bartonella Brucella  - Ceftriaxone 2 g IV daily x 6 weeks. Completed May 22, 2021. PICC is out - ICD removed - No infectious sx. WBC ok   7.  Ankylosing spondylitis - His local rheumatologist is hesitant to start remicade with VAD/infection risk - D/w [name] VAD program they have recommended f/u with Veva Holes, MD for 2nd opinion if needed.  - Currently feeling ok without it  8. Situational depression - Resolved. Wants to come off of Zoloft - We discussed plan for taper  Total time spent 35 minutes. Over half that time spent discussing above.    Glori Bickers, MD  5:11 PM

## 2021-12-23 ENCOUNTER — Other Ambulatory Visit (HOSPITAL_COMMUNITY): Payer: Self-pay | Admitting: Internal Medicine

## 2021-12-24 ENCOUNTER — Other Ambulatory Visit (HOSPITAL_COMMUNITY): Payer: Self-pay

## 2021-12-27 ENCOUNTER — Ambulatory Visit (HOSPITAL_COMMUNITY): Payer: Self-pay | Admitting: Pharmacist

## 2021-12-27 LAB — POCT INR: INR: 2 (ref 2.0–3.0)

## 2022-01-03 ENCOUNTER — Ambulatory Visit (HOSPITAL_COMMUNITY): Payer: Self-pay | Admitting: Pharmacist

## 2022-01-03 LAB — POCT INR: INR: 1.9 — AB (ref 2.0–3.0)

## 2022-01-07 ENCOUNTER — Telehealth: Payer: Medicare Other

## 2022-01-10 ENCOUNTER — Ambulatory Visit (HOSPITAL_COMMUNITY): Payer: Self-pay | Admitting: Pharmacist

## 2022-01-10 LAB — POCT INR: INR: 2.1 (ref 2.0–3.0)

## 2022-01-17 ENCOUNTER — Ambulatory Visit (HOSPITAL_COMMUNITY): Payer: Self-pay | Admitting: Pharmacist

## 2022-01-17 LAB — POCT INR: INR: 1.8 — AB (ref 2.0–3.0)

## 2022-01-20 ENCOUNTER — Other Ambulatory Visit (HOSPITAL_COMMUNITY): Payer: Self-pay | Admitting: Cardiology

## 2022-01-21 ENCOUNTER — Other Ambulatory Visit (HOSPITAL_COMMUNITY): Payer: Self-pay

## 2022-01-23 ENCOUNTER — Other Ambulatory Visit (HOSPITAL_COMMUNITY): Payer: Self-pay | Admitting: *Deleted

## 2022-01-23 ENCOUNTER — Other Ambulatory Visit (HOSPITAL_COMMUNITY): Payer: Self-pay

## 2022-01-23 MED ORDER — GABAPENTIN 300 MG PO CAPS
300.0000 mg | ORAL_CAPSULE | Freq: Three times a day (TID) | ORAL | 3 refills | Status: DC
  Filled 2022-01-23: qty 90, 30d supply, fill #0
  Filled 2022-02-18: qty 90, 30d supply, fill #1
  Filled 2022-03-20: qty 90, 30d supply, fill #2
  Filled 2022-04-19: qty 90, 30d supply, fill #3

## 2022-01-24 ENCOUNTER — Ambulatory Visit (HOSPITAL_COMMUNITY): Payer: Self-pay | Admitting: Pharmacist

## 2022-01-24 LAB — POCT INR: INR: 2.2 (ref 2.0–3.0)

## 2022-01-31 ENCOUNTER — Ambulatory Visit (HOSPITAL_COMMUNITY): Payer: Self-pay | Admitting: Pharmacist

## 2022-01-31 LAB — POCT INR: INR: 1.8 — AB (ref 2.0–3.0)

## 2022-02-05 ENCOUNTER — Other Ambulatory Visit (HOSPITAL_COMMUNITY): Payer: Self-pay | Admitting: Internal Medicine

## 2022-02-07 ENCOUNTER — Ambulatory Visit (HOSPITAL_COMMUNITY): Payer: Self-pay | Admitting: Pharmacist

## 2022-02-07 LAB — POCT INR: INR: 2 (ref 2.0–3.0)

## 2022-02-14 ENCOUNTER — Ambulatory Visit (HOSPITAL_COMMUNITY): Payer: Self-pay | Admitting: Pharmacist

## 2022-02-14 LAB — POCT INR: INR: 1.9 — AB (ref 2.0–3.0)

## 2022-02-18 ENCOUNTER — Other Ambulatory Visit: Payer: Self-pay | Admitting: Family Medicine

## 2022-02-18 ENCOUNTER — Other Ambulatory Visit (HOSPITAL_COMMUNITY): Payer: Self-pay

## 2022-02-18 ENCOUNTER — Encounter (HOSPITAL_COMMUNITY): Payer: Medicare Other

## 2022-02-18 NOTE — Telephone Encounter (Signed)
I seem like he is getting Tramadol from Dr Trudie Reed.  Dr Volanda Napoleon just prescribed it once on 11/05/21. Last refill 12/10/21. Brandon [name]

## 2022-02-19 ENCOUNTER — Other Ambulatory Visit (HOSPITAL_COMMUNITY): Payer: Self-pay | Admitting: *Deleted

## 2022-02-19 DIAGNOSIS — Z95811 Presence of heart assist device: Secondary | ICD-10-CM

## 2022-02-19 DIAGNOSIS — Z7901 Long term (current) use of anticoagulants: Secondary | ICD-10-CM

## 2022-02-20 ENCOUNTER — Ambulatory Visit: Payer: Medicare Other | Admitting: Family Medicine

## 2022-02-20 ENCOUNTER — Encounter (HOSPITAL_COMMUNITY): Payer: Self-pay

## 2022-02-20 ENCOUNTER — Ambulatory Visit (HOSPITAL_COMMUNITY)
Admission: RE | Admit: 2022-02-20 | Discharge: 2022-02-20 | Disposition: A | Discharge status: Home / Self Care | Payer: Medicare Other | Source: Ambulatory Visit | Attending: Internal Medicine | Admitting: Internal Medicine

## 2022-02-20 ENCOUNTER — Ambulatory Visit (HOSPITAL_COMMUNITY): Payer: Self-pay | Admitting: Pharmacist

## 2022-02-20 VITALS — BP 115/72 | HR 93 | Wt 208.4 lb

## 2022-02-20 DIAGNOSIS — Z953 Presence of xenogenic heart valve: Secondary | ICD-10-CM | POA: Diagnosis not present

## 2022-02-20 DIAGNOSIS — Z95811 Presence of heart assist device: Secondary | ICD-10-CM

## 2022-02-20 DIAGNOSIS — I252 Old myocardial infarction: Secondary | ICD-10-CM | POA: Insufficient documentation

## 2022-02-20 DIAGNOSIS — Z9889 Other specified postprocedural states: Secondary | ICD-10-CM | POA: Insufficient documentation

## 2022-02-20 DIAGNOSIS — Z79899 Other long term (current) drug therapy: Secondary | ICD-10-CM | POA: Diagnosis not present

## 2022-02-20 DIAGNOSIS — I2510 Atherosclerotic heart disease of native coronary artery without angina pectoris: Secondary | ICD-10-CM | POA: Insufficient documentation

## 2022-02-20 DIAGNOSIS — I480 Paroxysmal atrial fibrillation: Secondary | ICD-10-CM | POA: Insufficient documentation

## 2022-02-20 DIAGNOSIS — I5022 Chronic systolic (congestive) heart failure: Secondary | ICD-10-CM | POA: Diagnosis not present

## 2022-02-20 DIAGNOSIS — Z952 Presence of prosthetic heart valve: Secondary | ICD-10-CM

## 2022-02-20 DIAGNOSIS — Z7901 Long term (current) use of anticoagulants: Secondary | ICD-10-CM | POA: Diagnosis not present

## 2022-02-20 DIAGNOSIS — R6889 Other general symptoms and signs: Secondary | ICD-10-CM | POA: Insufficient documentation

## 2022-02-20 DIAGNOSIS — I110 Hypertensive heart disease with heart failure: Secondary | ICD-10-CM | POA: Insufficient documentation

## 2022-02-20 DIAGNOSIS — Z87891 Personal history of nicotine dependence: Secondary | ICD-10-CM | POA: Insufficient documentation

## 2022-02-20 DIAGNOSIS — E871 Hypo-osmolality and hyponatremia: Secondary | ICD-10-CM | POA: Diagnosis not present

## 2022-02-20 DIAGNOSIS — M459 Ankylosing spondylitis of unspecified sites in spine: Secondary | ICD-10-CM | POA: Diagnosis not present

## 2022-02-20 DIAGNOSIS — J90 Pleural effusion, not elsewhere classified: Secondary | ICD-10-CM | POA: Insufficient documentation

## 2022-02-20 LAB — BASIC METABOLIC PANEL
Anion gap: 7 (ref 5–15)
BUN: 14 mg/dL (ref 8–23)
CO2: 26 mmol/L (ref 22–32)
Calcium: 9.5 mg/dL (ref 8.9–10.3)
Chloride: 103 mmol/L (ref 98–111)
Creatinine, Ser: 1.01 mg/dL (ref 0.61–1.24)
GFR, Estimated: >60 mL/min (ref >60)
Glucose, Bld: 112 mg/dL — ABNORMAL HIGH (ref 70–99)
Potassium: 4.4 mmol/L (ref 3.5–5.1)
Sodium: 136 mmol/L (ref 135–145)

## 2022-02-20 LAB — CBC
HCT: 44 % (ref 39.0–52.0)
Hemoglobin: 14 g/dL (ref 13.0–17.0)
MCH: 26.9 pg (ref 26.0–34.0)
MCHC: 31.8 g/dL (ref 30.0–36.0)
MCV: 84.6 fL (ref 80.0–100.0)
Platelets: 229 10*3/uL (ref 150–400)
RBC: 5.2 MIL/uL (ref 4.22–5.81)
RDW: 16.2 % — ABNORMAL HIGH (ref 11.5–15.5)
WBC: 9 10*3/uL (ref 4.0–10.5)
nRBC: 0 % (ref 0.0–0.2)

## 2022-02-20 LAB — PROTIME-INR
INR: 2.1 — ABNORMAL HIGH (ref 0.8–1.2)
Prothrombin Time: 23.4 seconds — ABNORMAL HIGH (ref 11.4–15.2)

## 2022-02-20 LAB — LACTATE DEHYDROGENASE: LDH: 173 U/L (ref 98–192)

## 2022-02-20 MED ORDER — TORSEMIDE 20 MG PO TABS: 20.0000 mg | ORAL_TABLET | ORAL | 5 refills | Status: DC | PRN

## 2022-02-20 NOTE — Progress Notes (Signed)
LVAD Follow-up Clinic Note  PCP: [name], MD HF MD: DB   HPI:  Brandon [name] is a 66 year old male with history of tobacco use, CAD s/p previous anterior MI, severe systolic HF s/p HM-3 VAD 35/46/56, severe AS s/p AVR, endocarditis.    Admitted 12/20 with anterior ST elevation MI. Cath with chronically occluded RCA (L>>R collaterals) and thrombotic occlusion of proximal LAD treated with PCI. Echo w/ reduced LVEF 30-35% w/ apical aneurysm. RV ok. Post cath, he required milrinone for cardiogenic shock. Subsequently underwent Barostim.   Underwent outpatient [facility] 11/29/20 as part of VAD w/u. Post procedure, developed severe rigors and fever. Admitted to ICU Blood Cx + strep. 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  Likely low-flow low gradient AS. Underwent ICD extraction..    Admitted for cardiogenic shock in 10/22.  He underwent placement of HM3 VAD and aortic valve replacement with 25 mm Edwards Inspiris Resilia valve on 04/06/21. Surgical path came back 11/28 with evidence of possible "acute aortic valve endocarditis". 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 Discharged 05/09/21.    Follow up for Heart Failure/LVAD: Here for f/u. Feels great. Remains active. Going to gym regularly. Says he gets chest tightness unless he takes torsemide prior to going to gym. Denies orthopnea or PND. No fevers, chills or problems with driveline. No bleeding, melena or neuro symptoms. No VAD alarms. Taking all meds as prescribed.      VAD Interrogation: Speed: 5850 Flow: 5.1 Power: 4.8w    PI: 3.6 Alarms: none Events: 40 - 60 PI events daily  Fixed speed: 5800 Low speed limit: 5500   Primary controller: back up battery due for replacement in 22 months Secondary  controller:  back up battery due for replacement in 23 months    I reviewed the LVAD parameters from today and compared the results to the patient's prior recorded data. LVAD interrogation was NEGATIVE for significant power changes, NEGATIVE for clinical alarms and STABLE for PI events/speed drops. No programming changes were made and pump is functioning within specified parameters. Pt is performing daily controller and system monitor self tests along with completing weekly and monthly maintenance for LVAD equipment.   LVAD equipment check completed and is in good working order. Back-up equipment  present.    Annual Equipment Maintenance on UBC/PM was performed on 12/22.    Past Medical History:  Diagnosis Date   AICD (automatic cardioverter/defibrillator) present 08/31/2019   Ankylosing spondylitis ([place])    Arthritis    BENIGN PROSTATIC HYPERTROPHY 06/07/2008   CHF (congestive heart failure) ([place])    COLONIC POLYPS, HX OF 06/07/2008   Coronary artery disease    Depression    H/O hiatal hernia    Heart failure ([place])    HYPERLIPIDEMIA 06/07/2008   HYPERTENSION 06/07/2008   Myocardial infarction [facility]) 2005   NSTEMI, s/p LAD stent   NEPHROLITHIASIS, HX OF 06/07/2008   STEMI (ST elevation myocardial infarction) ([place]) 04/27/2019    Current Outpatient Medications  Medication Sig Dispense Refill   acetaminophen (TYLENOL) 500 MG tablet Take 1,000 mg by mouth every 6 (six) hours as needed (pain.).     atorvastatin (LIPITOR) 80 MG tablet TAKE 1 TABLET BY MOUTH ONCE DAILY AT 6PM. 90 tablet 3   Carboxymethylcellulose Sodium (ARTIFICIAL TEARS OP) Place 1 drop  into both eyes daily as needed (for dryness or irritation).     eplerenone (INSPRA) 50 MG tablet Take 1 tablet (50 mg total) by mouth in the morning. 30 tablet 6   ezetimibe (ZETIA) 10 MG tablet Take 1 tablet (10 mg total) by mouth daily. 90 tablet 3   gabapentin (NEURONTIN) 300 MG capsule Take 1 capsule (300 mg total) by mouth 3 (three)  times daily. 90 capsule 3   losartan (COZAAR) 25 MG tablet Take 1/2 tablet (12.5 mg total) by mouth daily. 30 tablet 5   melatonin 5 MG TABS Take 1 tablet (5 mg total) by mouth at bedtime. (Patient taking differently: Take 10 mg by mouth once a week.) 30 tablet 6   ondansetron (ZOFRAN) 4 MG tablet Take 1 tablet (4 mg total) by mouth every 8 (eight) hours as needed for nausea or vomiting. 20 tablet 3   pantoprazole (PROTONIX) 40 MG tablet TAKE 1 TABLET BY MOUTH EVERY DAY 90 tablet 3   potassium chloride SA (KLOR-CON M) 20 MEQ tablet Take 1 tablet (20 mEq total) by mouth daily. 90 tablet 3   tamsulosin (FLOMAX) 0.4 MG CAPS capsule Take 1 capsule (0.4 mg total) by mouth daily. 90 capsule 3   traMADol (ULTRAM) 50 MG tablet TAKE 1-2 TABLETS BY MOUTH TWICE A DAY FOR 30 DAYS 120 tablet 0   warfarin (COUMADIN) 2.5 MG tablet Take 7.5 mg (3 tablets) every Monday/Friday and 5 mg (2 tablets) all other days or as instructed by LVAD clinic. 180 tablet 11   nystatin ointment (MYCOSTATIN) Apply 1 application topically 2 (two) times daily. (Patient not taking: Reported on 10/11/2021) 30 g 3   sertraline (ZOLOFT) 100 MG tablet Take 1 tablet (100 mg total) by mouth daily. 90 tablet 3   torsemide (DEMADEX) 20 MG tablet Take 1 tablet (20 mg total) by mouth as needed. 30 tablet 5   No current facility-administered medications for this encounter.    Patient has no known allergies.    Vitals:   02/20/22 1043 02/20/22 1044 02/20/22 1045  BP: (!) 112/100 (!) 100/0 115/72  Pulse: 93    Weight: 94.5 kg (208 lb 6.4 oz)      Vital Signs:     Doppler Pressure: 100 Automatc BP:  112/100(106) Repeat: 115/72 (87) HR:  93                          SPO2:  100% on RA   Weight: 208.4 w/ ept Last weight: 204.4 lb w/ eqt Ht: 6'  Physical Exam: General:  NAD.  HEENT: normal  Neck: supple. JVP not elevated.  Carotids 2+ bilat; no bruits. No lymphadenopathy or thryomegaly appreciated. Cor: LVAD hum.  Lungs:  Clear. Abdomen: soft, nontender, non-distended. No hepatosplenomegaly. No bruits or masses. Good bowel sounds. Driveline site clean. Anchor in place.  Extremities: no cyanosis, clubbing, rash. Warm no edema  Neuro: alert & oriented x 3. No focal deficits. Moves all 4 without problem     ASSESSMENT AND PLAN:  1. Chronic systolic HFr EF due to iCM - Echo 05/19/20 EF 20-25% RV mildly HK. moderate AS  Mean gradient 13 AVA 1.2 cm2 DI 0.30 - s/p HM-III VAD + bioprosthetic AVR 04/06/21 - Doing great. NYHA I - Orthostasis resolved. Takes torsemide before going to the gym. Told him to be careful not to overdo diuresis. Does have PIs on pump but no low flows. Ok to continue - Continue Losartan 12.5  mg daily - Continue eplerenone 12.5 mg daily   2. HM-3 LVAD implant - VAD interrogated personally. Parameters stable. - ASA stopped 12/26 - INR 2.1 Goal 2.0-3.0 Discussed dosing with PharmD personally. - LDH 173 - Hgb 14.0 - MAPs ok - DL site ok   3. Severe low-flow aortic stenosis s/p AVR - s/p VAD/bioprosthetic AVR on 11/25   4. CAD  - s/p anterior STEMI (12/20). LHC showed chronically occluded RCA (with L>>R collaterals) and thrombotic occlusion of proximal LAD. Underwent PCI of LAD. - Now s/p VAD - Has occasional chest tightness with exercise but resolves with torsemide. Doubt anginal. Stable - Continue atorvastatin 80.  - off ASA.    5. Paroxysmal Atrial Fibrillation w/ RVR - Remains in NSR  - Off amio - on warfarin.    6. 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  - Bcx recollected and pending  - Tissue sent to [facility] in [place] for broad 16 S ribosomal sequencing for bacterial pathogens as well as T wet Bhilai Bartonella Brucella  - Ceftriaxone 2 g IV daily x 6 weeks. Completed May 22, 2021. PICC is out - ICD removed - No infectious sx. WBC ok   7. Ankylosing spondylitis - His  local rheumatologist is hesitant to start remicade with VAD/infection risk - D/w [name] VAD program they have recommended f/u with Veva Holes, MD for 2nd opinion if needed.  - Currently feeling ok without it - PCP managing pain meds  Total time spent 35 minutes. Over half that time spent discussing above.    Glori Bickers, MD  5:40 PM

## 2022-02-20 NOTE — Progress Notes (Signed)
Patient presents for 2 mo f/u  in [facility] today alone. Reports no problems with VAD equipment or concerns with drive line.  Patient says he is feeling "great" and has no limitations with activities. Denies lightheadedness, dizziness, falls, shortness of breath, and signs of bleeding. He has completed Cardiac Rehab and now is going to the [employer] 3x a week.   Pt tells me that he alternates taking a Torsemide or Lasix on the days he goes to the gym. He states this helps him feel better and helps with the SOB. Instructed by Dr. [name] to take Toresmide only as needed for swelling and to ensure proper hydration on days that he exercises.  Patient is seeing PCP Friday to discuss ongoing chronic pain. Patient states this is improved with exercise and stretching. He currently take Tylenol and Tramdol as needed for pain.   Vital Signs:    Doppler Pressure: 100 Automatc BP:  112/100(106) Repeat: 115/72 (87) HR:  93    SPO2:  100% on RA   Weight: 208.4 w/ ept Last weight: 204.4 lb w/ eqt Ht: 6'   VAD Interrogation: Speed: 5850 Flow: 5.1 Power: 4.8w    PI: 3.6 Alarms: none Events: 40 - 60 PI events daily  Fixed speed: 5800 Low speed limit: 5500  Primary controller: back up battery due for replacement in 22 months Secondary controller:  back up battery due for replacement in 23 months    I reviewed the LVAD parameters from today and compared the results to the patient's prior recorded data. LVAD interrogation was NEGATIVE for significant power changes, NEGATIVE for clinical alarms and STABLE for PI events/speed drops. No programming changes were made and pump is functioning within specified parameters. Pt is performing daily controller and system monitor self tests along with completing weekly and monthly maintenance for LVAD equipment.   LVAD equipment check completed and is in good working order. Back-up equipment  present.    Annual Equipment Maintenance on UBC/PM was performed  on 12/22.   Exit Site Care: Dressing CDI. No redness, tenderness, drainage or odor reported by patient. Will continue weekly dressings.  Provided patient with 8 weekly kits.  Device: N/A  (removed due to infection 12/04/20)   BP & Labs:  Doppler 100 - Doppler is reflecting modified systolic   Hgb 14  - No S/S of bleeding. Specifically denies melena/BRBPR or nosebleeds.   LDH stable at 173 with established baseline of 135 - 309. Denies tea-colored urine. No power elevations noted on interrogation.   Patient Instructions:  Take Toresmide as needed for swelling; continue to stay hydrated  Return to [facility] in 2 months.  Coumadin dosing per Ander Purpura, PharmD. Pt will re-check INR next week with his home machine.    Bobbye Morton RN,BSN [place] Coordinator  Office: [phone]  24/7 Pager: [phone]

## 2022-02-20 NOTE — Patient Instructions (Signed)
Continue Toresmide as needed Return to [facility] in 2 months.  Coumadin dosing per Ander Purpura, PharmD. Pt will re-check INR next week with his home machine.

## 2022-02-22 ENCOUNTER — Ambulatory Visit (INDEPENDENT_AMBULATORY_CARE_PROVIDER_SITE_OTHER): Payer: Medicare Other | Admitting: Family Medicine

## 2022-02-22 ENCOUNTER — Encounter: Payer: Self-pay | Admitting: Family Medicine

## 2022-02-22 VITALS — BP 100/82 | HR 32 | Temp 97.5°F | Wt 209.4 lb

## 2022-02-22 DIAGNOSIS — M456 Ankylosing spondylitis lumbar region: Secondary | ICD-10-CM | POA: Diagnosis not present

## 2022-02-22 DIAGNOSIS — Z7901 Long term (current) use of anticoagulants: Secondary | ICD-10-CM | POA: Diagnosis not present

## 2022-02-22 DIAGNOSIS — Z95811 Presence of heart assist device: Secondary | ICD-10-CM

## 2022-02-22 DIAGNOSIS — I2510 Atherosclerotic heart disease of native coronary artery without angina pectoris: Secondary | ICD-10-CM | POA: Diagnosis not present

## 2022-02-22 MED ORDER — TRAMADOL HCL 50 MG PO TABS: ORAL_TABLET | ORAL | 2 refills | Status: DC

## 2022-02-22 NOTE — Progress Notes (Signed)
Subjective:    Patient ID: Brandon [name], male    DOB: [birth year], 66 y.o.   MRN: [id number]  Chief Complaint  Patient presents with   Medication Refill    Tramadol    HPI Patient is a 66 year old male with PMH SIG for ankylosing spondylitis, CHF, LVAD, history of depression, on chronic anticoagulation with Coumadin s/p AVR, CAD, HTN, ILD, HLD, BPH, history of tobacco use, who was seen today for follow-up and med refill.  Patient states he has been doing well and feeling great overall.  Pt exercising 5 times per week at [place].  Endorses stiffness in the morning.  Will take tramadol in a.m. for hi/o ankylosing spondylitis. Takes Tylenol during the day and an additional dose of tramadol at night if needed followed by rheumatology however they are no longer prescribing patient's tramadol.  Pt no longer on Remicade as rheumatologist was concerned it may become indicated 2/2 patient's LVAD.  At last OFV this provider spent 15+ minutes on phone with Remicade manufacturer who did not see any contraindication in the literature.  Patient followed by cardiology who is okay with restarting Remicade if needed.  Patient inquires about immunizations.  Patient inquires if he is able to donate blood.   Past Medical History:  Diagnosis Date   AICD (automatic cardioverter/defibrillator) present 08/31/2019   Ankylosing spondylitis ([place])    Arthritis    BENIGN PROSTATIC HYPERTROPHY 06/07/2008   CHF (congestive heart failure) ([place])    COLONIC POLYPS, HX OF 06/07/2008   Coronary artery disease    Depression    H/O hiatal hernia    Heart failure ([place])    HYPERLIPIDEMIA 06/07/2008   HYPERTENSION 06/07/2008   Myocardial infarction [facility]) 2005   NSTEMI, s/p LAD stent   NEPHROLITHIASIS, HX OF 06/07/2008   STEMI (ST elevation myocardial infarction) ([place]) 04/27/2019    No Known Allergies  ROS General: Denies fever, chills, night sweats, changes in weight, changes in appetite HEENT: Denies headaches, ear  pain, changes in vision, rhinorrhea, sore throat CV: Denies CP, palpitations, SOB, orthopnea Pulm: Denies SOB, cough, wheezing GI: Denies abdominal pain, nausea, vomiting, diarrhea, constipation GU: Denies dysuria, hematuria, frequency Msk: Denies muscle cramps + back pain/stiffness Neuro: Denies weakness, numbness, tingling Skin: Denies rashes, bruising Psych: Denies depression, anxiety, hallucinations     Objective:    Blood pressure 100/82, pulse (!) 32, temperature (!) 97.5 F (36.4 C), temperature source Oral, weight 209 lb 6.4 oz (95 kg), SpO2 96 %.  Gen. Pleasant, well-nourished, in no distress, normal affect   HEENT: [place]/AT, face symmetric, conjunctiva clear, no scleral icterus, PERRLA, EOMI, nares patent without drainage Lungs: no accessory muscle use Cardiovascular: Mechanical LVAD hum, no peripheral edema Neuro:  A&Ox3, CN II-XII intact, normal gait Skin:  Warm, no lesions/ rash   Wt Readings from Last 3 Encounters:  02/22/22 209 lb 6.4 oz (95 kg)  02/20/22 208 lb 6.4 oz (94.5 kg)  12/17/21 204 lb 6.4 oz (92.7 kg)    Lab Results  Component Value Date   WBC 9.0 02/20/2022   HGB 14.0 02/20/2022   HCT 44.0 02/20/2022   PLT 229 02/20/2022   GLUCOSE 112 (H) 02/20/2022   CHOL 122 02/06/2021   TRIG 124 02/06/2021   HDL 37 (L) 02/06/2021   LDLCALC 60 02/06/2021   ALT 20 10/11/2021   AST 22 10/11/2021   NA 136 02/20/2022   K 4.4 02/20/2022   CL 103 02/20/2022   CREATININE 1.01 02/20/2022   BUN 14 02/20/2022  CO2 26 02/20/2022   TSH 0.917 02/06/2021   PSA 0.96 04/15/2019   INR 2.1 (H) 02/20/2022   HGBA1C 6.0 (H) 04/07/2021    Assessment/Plan:  Ankylosing spondylitis of lumbar region ([place])  -Improving -Continue exercise and lifestyle modifications -Continue tramadol and Tylenol as needed -Continue follow-up with rheumatology -Consider restarting Remicade for increased/worsening symptoms - Plan: traMADol (ULTRAM) 50 MG tablet  LVAD (left ventricular  assist device) present ([place]) -Continue follow-up with cardiology  Chronic anticoagulation -Continue Coumadin for history of AVR -Given use of Coumadin would be unable to donate blood as it affects clotting ability  F/u prn  Grier Mitts, MD

## 2022-02-22 NOTE — Patient Instructions (Addendum)
Unfortunately, because you are on Coumadin you would not be able to donate blood as the Coumadin affects clotting.

## 2022-02-25 ENCOUNTER — Other Ambulatory Visit: Payer: Self-pay | Admitting: Infectious Diseases

## 2022-02-25 NOTE — Telephone Encounter (Signed)
Please advise on refill.

## 2022-02-26 ENCOUNTER — Encounter (INDEPENDENT_AMBULATORY_CARE_PROVIDER_SITE_OTHER): Payer: Medicare Other | Admitting: Ophthalmology

## 2022-02-26 DIAGNOSIS — I10 Essential (primary) hypertension: Secondary | ICD-10-CM

## 2022-02-26 DIAGNOSIS — H43813 Vitreous degeneration, bilateral: Secondary | ICD-10-CM

## 2022-02-26 DIAGNOSIS — H35033 Hypertensive retinopathy, bilateral: Secondary | ICD-10-CM | POA: Diagnosis not present

## 2022-02-26 DIAGNOSIS — H35712 Central serous chorioretinopathy, left eye: Secondary | ICD-10-CM | POA: Diagnosis not present

## 2022-02-26 DIAGNOSIS — H353132 Nonexudative age-related macular degeneration, bilateral, intermediate dry stage: Secondary | ICD-10-CM

## 2022-02-28 ENCOUNTER — Ambulatory Visit (HOSPITAL_COMMUNITY): Payer: Self-pay | Admitting: Pharmacist

## 2022-02-28 LAB — POCT INR: INR: 1.7 — AB (ref 2.0–3.0)

## 2022-03-07 ENCOUNTER — Ambulatory Visit (HOSPITAL_COMMUNITY): Payer: Self-pay | Admitting: Pharmacist

## 2022-03-07 LAB — POCT INR: INR: 1.8 — AB (ref 2.0–3.0)

## 2022-03-14 ENCOUNTER — Ambulatory Visit (HOSPITAL_COMMUNITY): Payer: Self-pay | Admitting: Pharmacist

## 2022-03-14 LAB — POCT INR: INR: 1.9 — AB (ref 2.0–3.0)

## 2022-03-20 ENCOUNTER — Other Ambulatory Visit (HOSPITAL_COMMUNITY): Payer: Self-pay

## 2022-03-21 ENCOUNTER — Ambulatory Visit (HOSPITAL_COMMUNITY): Payer: Self-pay | Admitting: Pharmacist

## 2022-03-21 LAB — POCT INR: INR: 2 (ref 2.0–3.0)

## 2022-03-28 ENCOUNTER — Ambulatory Visit (HOSPITAL_COMMUNITY): Payer: Self-pay | Admitting: Pharmacist

## 2022-03-28 LAB — POCT INR: INR: 2 (ref 2.0–3.0)

## 2022-03-29 ENCOUNTER — Other Ambulatory Visit (HOSPITAL_COMMUNITY): Payer: Self-pay | Admitting: Cardiology

## 2022-03-29 ENCOUNTER — Other Ambulatory Visit (HOSPITAL_COMMUNITY): Payer: Self-pay

## 2022-03-29 MED ORDER — LOSARTAN POTASSIUM 25 MG PO TABS
12.5000 mg | ORAL_TABLET | Freq: Every day | ORAL | 5 refills | Status: DC
  Filled 2022-03-29: qty 30, 60d supply, fill #0
  Filled 2022-05-23: qty 30, 60d supply, fill #1
  Filled 2022-07-20: qty 30, 60d supply, fill #2
  Filled 2022-09-20: qty 30, 60d supply, fill #3
  Filled 2022-11-13: qty 30, 60d supply, fill #4
  Filled 2023-01-13: qty 30, 60d supply, fill #5

## 2022-04-02 ENCOUNTER — Ambulatory Visit (HOSPITAL_COMMUNITY): Payer: Self-pay | Admitting: Pharmacist

## 2022-04-02 LAB — POCT INR: INR: 2.5 (ref 2.0–3.0)

## 2022-04-09 ENCOUNTER — Telehealth: Payer: Self-pay | Admitting: Family Medicine

## 2022-04-09 NOTE — Telephone Encounter (Signed)
Pt requesting low dose CT lung cancer screening. Also requesting a refill traMADol (ULTRAM) 50 MG tablet  [id number] [address], [place] Phone: [phone]  Fax: [phone]

## 2022-04-11 ENCOUNTER — Ambulatory Visit (HOSPITAL_COMMUNITY): Payer: Self-pay

## 2022-04-11 LAB — POCT INR: INR: 2.5 (ref 2.0–3.0)

## 2022-04-11 NOTE — Progress Notes (Signed)
LVAD INR 

## 2022-04-11 NOTE — Patient Instructions (Signed)
Description   INR at goal. Instructed to continue recently increased warfarin regimen of 5 mg (2 tabs) every T/Th/Sat and 7.5 mg (3 tablets) all other days.  - Will continue eating greens 3-4x/week - notes he eats more greens during warmer weather -Has new home INR machine - will check every Thursday - Has been drinking 2 drinks per night (vodka/tonic) for last 3 months ; working out every other day

## 2022-04-12 ENCOUNTER — Other Ambulatory Visit: Payer: Self-pay | Admitting: Family Medicine

## 2022-04-12 ENCOUNTER — Encounter (HOSPITAL_COMMUNITY): Payer: Self-pay

## 2022-04-12 DIAGNOSIS — M456 Ankylosing spondylitis lumbar region: Secondary | ICD-10-CM

## 2022-04-12 DIAGNOSIS — Z87891 Personal history of nicotine dependence: Secondary | ICD-10-CM

## 2022-04-12 DIAGNOSIS — Z122 Encounter for screening for malignant neoplasm of respiratory organs: Secondary | ICD-10-CM

## 2022-04-12 MED ORDER — TRAMADOL HCL 50 MG PO TABS: ORAL_TABLET | ORAL | 1 refills | Status: DC

## 2022-04-12 NOTE — Telephone Encounter (Signed)
RX sent in and CT ordered.

## 2022-04-18 ENCOUNTER — Ambulatory Visit (HOSPITAL_COMMUNITY): Payer: Self-pay | Admitting: Pharmacist

## 2022-04-18 LAB — POCT INR: INR: 2.8 (ref 2.0–3.0)

## 2022-04-19 ENCOUNTER — Other Ambulatory Visit (HOSPITAL_COMMUNITY): Payer: Self-pay

## 2022-04-22 ENCOUNTER — Observation Stay (HOSPITAL_COMMUNITY)
Admission: EM | Admit: 2022-04-22 | Discharge: 2022-04-23 | Disposition: A | Discharge status: Home / Self Care | Payer: Medicare Other | Attending: Internal Medicine | Admitting: Internal Medicine

## 2022-04-22 ENCOUNTER — Telehealth: Payer: Self-pay

## 2022-04-22 ENCOUNTER — Other Ambulatory Visit (HOSPITAL_COMMUNITY): Payer: Self-pay | Admitting: *Deleted

## 2022-04-22 DIAGNOSIS — Z954 Presence of other heart-valve replacement: Secondary | ICD-10-CM | POA: Insufficient documentation

## 2022-04-22 DIAGNOSIS — I480 Paroxysmal atrial fibrillation: Secondary | ICD-10-CM | POA: Diagnosis not present

## 2022-04-22 DIAGNOSIS — I4901 Ventricular fibrillation: Principal | ICD-10-CM

## 2022-04-22 DIAGNOSIS — Z95811 Presence of heart assist device: Secondary | ICD-10-CM | POA: Insufficient documentation

## 2022-04-22 DIAGNOSIS — I2510 Atherosclerotic heart disease of native coronary artery without angina pectoris: Secondary | ICD-10-CM | POA: Diagnosis not present

## 2022-04-22 DIAGNOSIS — Z79899 Other long term (current) drug therapy: Secondary | ICD-10-CM | POA: Diagnosis not present

## 2022-04-22 DIAGNOSIS — I5022 Chronic systolic (congestive) heart failure: Secondary | ICD-10-CM | POA: Insufficient documentation

## 2022-04-22 DIAGNOSIS — Z87891 Personal history of nicotine dependence: Secondary | ICD-10-CM | POA: Insufficient documentation

## 2022-04-22 DIAGNOSIS — Z955 Presence of coronary angioplasty implant and graft: Secondary | ICD-10-CM | POA: Insufficient documentation

## 2022-04-22 DIAGNOSIS — Z7901 Long term (current) use of anticoagulants: Secondary | ICD-10-CM | POA: Insufficient documentation

## 2022-04-22 DIAGNOSIS — R0602 Shortness of breath: Secondary | ICD-10-CM | POA: Diagnosis present

## 2022-04-22 DIAGNOSIS — Z95810 Presence of automatic (implantable) cardiac defibrillator: Secondary | ICD-10-CM | POA: Diagnosis not present

## 2022-04-22 DIAGNOSIS — I110 Hypertensive heart disease with heart failure: Secondary | ICD-10-CM | POA: Insufficient documentation

## 2022-04-22 LAB — BASIC METABOLIC PANEL
Anion gap: 14 (ref 5–15)
BUN: 20 mg/dL (ref 8–23)
CO2: 21 mmol/L — ABNORMAL LOW (ref 22–32)
Calcium: 10.2 mg/dL (ref 8.9–10.3)
Chloride: 100 mmol/L (ref 98–111)
Creatinine, Ser: 1.24 mg/dL (ref 0.61–1.24)
GFR, Estimated: >60 mL/min (ref >60)
Glucose, Bld: 121 mg/dL — ABNORMAL HIGH (ref 70–99)
Potassium: 3.8 mmol/L (ref 3.5–5.1)
Sodium: 135 mmol/L (ref 135–145)

## 2022-04-22 LAB — CBC WITH DIFFERENTIAL/PLATELET
Abs Immature Granulocytes: 0.05 10*3/uL (ref 0.00–0.07)
Basophils Absolute: 0 10*3/uL (ref 0.0–0.1)
Basophils Relative: 0 %
Eosinophils Absolute: 0.1 10*3/uL (ref 0.0–0.5)
Eosinophils Relative: 1 %
HCT: 44.1 % (ref 39.0–52.0)
Hemoglobin: 14.3 g/dL (ref 13.0–17.0)
Immature Granulocytes: 1 %
Lymphocytes Relative: 10 %
Lymphs Abs: 1 10*3/uL (ref 0.7–4.0)
MCH: 26.7 pg (ref 26.0–34.0)
MCHC: 32.4 g/dL (ref 30.0–36.0)
MCV: 82.3 fL (ref 80.0–100.0)
Monocytes Absolute: 0.7 10*3/uL (ref 0.1–1.0)
Monocytes Relative: 7 %
Neutro Abs: 8.6 10*3/uL — ABNORMAL HIGH (ref 1.7–7.7)
Neutrophils Relative %: 81 %
Platelets: 167 10*3/uL (ref 150–400)
RBC: 5.36 MIL/uL (ref 4.22–5.81)
RDW: 16.1 % — ABNORMAL HIGH (ref 11.5–15.5)
WBC: 10.5 10*3/uL (ref 4.0–10.5)
nRBC: 0 % (ref 0.0–0.2)

## 2022-04-22 LAB — PHOSPHORUS: Phosphorus: 4.7 mg/dL — ABNORMAL HIGH (ref 2.5–4.6)

## 2022-04-22 LAB — PROTIME-INR
INR: 2.2 — ABNORMAL HIGH (ref 0.8–1.2)
Prothrombin Time: 24.5 seconds — ABNORMAL HIGH (ref 11.4–15.2)

## 2022-04-22 LAB — TROPONIN I (HIGH SENSITIVITY): Troponin I (High Sensitivity): 45 ng/L — ABNORMAL HIGH (ref <18)

## 2022-04-22 LAB — MAGNESIUM: Magnesium: 1.9 mg/dL (ref 1.7–2.4)

## 2022-04-22 MED ORDER — ETOMIDATE 2 MG/ML IV SOLN: 6.0000 mg | Freq: Once | INTRAVENOUS | Status: DC

## 2022-04-22 MED ORDER — SERTRALINE HCL 100 MG PO TABS
100.0000 mg | ORAL_TABLET | Freq: Every day | ORAL | Status: DC
  Administered 2022-04-23: 100 mg via ORAL
  Filled 2022-04-22: qty 1

## 2022-04-22 MED ORDER — MIDAZOLAM HCL 2 MG/2ML IJ SOLN
2.0000 mg | Freq: Once | INTRAMUSCULAR | Status: DC
  Filled 2022-04-22: qty 2

## 2022-04-22 MED ORDER — POTASSIUM CHLORIDE CRYS ER 20 MEQ PO TBCR
40.0000 meq | EXTENDED_RELEASE_TABLET | Freq: Once | ORAL | Status: AC
  Administered 2022-04-23: 40 meq via ORAL
  Filled 2022-04-22: qty 2

## 2022-04-22 MED ORDER — ATORVASTATIN CALCIUM 80 MG PO TABS
80.0000 mg | ORAL_TABLET | Freq: Every day | ORAL | Status: DC
  Administered 2022-04-23: 80 mg via ORAL
  Filled 2022-04-22: qty 1

## 2022-04-22 MED ORDER — EZETIMIBE 10 MG PO TABS
10.0000 mg | ORAL_TABLET | Freq: Every day | ORAL | Status: DC
  Administered 2022-04-23: 10 mg via ORAL
  Filled 2022-04-22: qty 1

## 2022-04-22 MED ORDER — SODIUM CHLORIDE 0.9 % IV SOLN: INTRAVENOUS | Status: DC

## 2022-04-22 MED ORDER — TRAMADOL HCL 50 MG PO TABS: 100.0000 mg | ORAL_TABLET | Freq: Four times a day (QID) | ORAL | Status: DC | PRN

## 2022-04-22 MED ORDER — WARFARIN SODIUM 7.5 MG PO TABS
7.5000 mg | ORAL_TABLET | Freq: Once | ORAL | Status: AC
  Administered 2022-04-23: 7.5 mg via ORAL
  Filled 2022-04-22 (×2): qty 1

## 2022-04-22 MED ORDER — ACETAMINOPHEN 500 MG PO TABS: 1000.0000 mg | ORAL_TABLET | Freq: Four times a day (QID) | ORAL | Status: DC | PRN

## 2022-04-22 MED ORDER — GABAPENTIN 300 MG PO CAPS
300.0000 mg | ORAL_CAPSULE | Freq: Three times a day (TID) | ORAL | Status: DC
  Administered 2022-04-23 (×3): 300 mg via ORAL
  Filled 2022-04-22 (×3): qty 1

## 2022-04-22 MED ORDER — ETOMIDATE 2 MG/ML IV SOLN
INTRAVENOUS | Status: AC | PRN
  Administered 2022-04-22: 6 mg via INTRAVENOUS
  Administered 2022-04-22 (×2): 1.5 mg via INTRAVENOUS

## 2022-04-22 MED ORDER — WARFARIN - PHARMACIST DOSING INPATIENT: Freq: Every day | Status: DC

## 2022-04-22 MED ORDER — ETOMIDATE 2 MG/ML IV SOLN: 10.0000 mg | Freq: Once | INTRAVENOUS | Status: DC

## 2022-04-22 MED ORDER — MAGNESIUM SULFATE 2 GM/50ML IV SOLN
2.0000 g | Freq: Once | INTRAVENOUS | Status: AC
  Administered 2022-04-23: 2 g via INTRAVENOUS
  Filled 2022-04-22: qty 50

## 2022-04-22 MED ORDER — TAMSULOSIN HCL 0.4 MG PO CAPS
0.4000 mg | ORAL_CAPSULE | Freq: Every day | ORAL | Status: DC
  Administered 2022-04-23: 0.4 mg via ORAL
  Filled 2022-04-22: qty 1

## 2022-04-22 MED ORDER — MIDAZOLAM HCL 2 MG/2ML IJ SOLN
INTRAMUSCULAR | Status: AC | PRN
  Administered 2022-04-22: 2 mg via INTRAVENOUS

## 2022-04-22 MED ORDER — PHENYLEPHRINE 80 MCG/ML (10ML) SYRINGE FOR IV PUSH (FOR BLOOD PRESSURE SUPPORT)
PREFILLED_SYRINGE | INTRAVENOUS | Status: AC | PRN
  Administered 2022-04-22: 160 ug via INTRAVENOUS

## 2022-04-22 MED ORDER — PANTOPRAZOLE SODIUM 40 MG PO TBEC
40.0000 mg | DELAYED_RELEASE_TABLET | Freq: Every day | ORAL | Status: DC
  Administered 2022-04-23: 40 mg via ORAL
  Filled 2022-04-22: qty 1

## 2022-04-22 MED ORDER — FENTANYL CITRATE PF 50 MCG/ML IJ SOSY
PREFILLED_SYRINGE | INTRAMUSCULAR | Status: AC
  Filled 2022-04-22: qty 2

## 2022-04-22 NOTE — ED Notes (Signed)
Girlfriend [name] [phone] would like an update asap

## 2022-04-22 NOTE — H&P (Signed)
Advanced Heart Failure VAD History and Physical Note   PCP-Cardiologist: [name], MD   Reason for Admission: Ventricular fibrillation   HPI:     Brandon [name] is a 66 y.o.male (originally from [place], [place]) with h/o CAD s/p previous MI, severe AS, chronic systolic HF s/p HM-3 VAD in 10/22   He was admitted 04/27/19 with anterior ST elevation. Taken to cath lab which showed chronically occluded RCA (L>>R collaterals) and thrombotic occlusion of proximal LAD. Underwent PCI of LAD. Echo w/ reduced LVEF 30-35% w/ apical aneurysm. RV ok. Post cath, he required milrinone for cardiogenic shock.  Underwent outpatient [facility] 11/29/20 as part of VAD w/u. Post procedure, developed severe rigors and fever. Admitted to ICU Blood Cx + strep. 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  Likely low-flow low gradient AS. Underwent ICD extraction..    Admitted for cardiogenic shock in 10/22. He underwent placement of HM3 VAD and aortic valve replacement with 25 mm Edwards Inspiris Resilia valve on 04/06/21. Surgical path came back 11/28 with evidence of possible "acute aortic valve endocarditis". 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 Exercising. Today at 5pm acute onset SOB, diaphoresis and sense of doom. Flows down on VAD. Called EMS and transported to ER. I met him in ER. On arrival was in VF. MAPs 70-80s  Given IVF. Then sedated by ER team. After appropriate sedation we defibrillated back to NSR. Now feels fine     LVAD INTERROGATION:  HeartMate III LVAD:  Flow 5.1 liters/min, speed 5800, power 4.8, PI 2.8 multiple PI events.     Review of Systems: [y] = yes, [ ] = no   General: Weight gain [ ]; Weight loss [ ]; Anorexia [ ]; Fatigue [ ]; Fever [ ]; Chills [ ]; Weakness [ ]  Cardiac: Chest pain/pressure [ ]; Resting SOB [ y]; Exertional SOB [ ]; Orthopnea [ ];  Pedal Edema [ ]; Palpitations [ ]; Syncope [ ]; Presyncope [name] ]; Paroxysmal nocturnal dyspnea[ ]  Pulmonary: Cough [ ]; Wheezing[ ]; Hemoptysis[ ]; Sputum [ ]; Snoring [ ]  GI: Vomiting[ ]; Dysphagia[ ]; Melena[ ]; Hematochezia [ ]; Heartburn[ ]; Abdominal pain [ ]; Constipation [ ]; Diarrhea [ ]; BRBPR [ ]  GU: Hematuria[ ]; Dysuria [ ]; Nocturia[ ]  Vascular: Pain in legs with walking [ ]; Pain in feet with lying flat [ ]; Non-healing sores [ ]; Stroke [ ]; TIA [ ]; Slurred speech [ ];  Neuro: Headaches[ ]; Vertigo[ ]; Seizures[ ]; Paresthesias[ ];Blurred vision [ ]; Diplopia [ ]; Vision changes [ ]  Ortho/Skin: Arthritis [ y]; Joint pain [ ]y; Muscle pain [ ]; Joint swelling [ ]; Back Pain [ ]; Rash [ ]  Psych: Depression[ ]; Anxiety[ ]  Heme: Bleeding problems [ ]; Clotting disorders [ ]; Anemia [ ]  Endocrine: Diabetes [ ]; Thyroid dysfunction[ ]    Home Medications Prior to Admission medications   Medication Sig Start Date End Date Taking? Authorizing Provider  acetaminophen (TYLENOL) 500 MG tablet Take 1,000 mg by mouth every 6 (six) hours as needed (pain.).    [provider]  amoxicillin (AMOXIL) 500 MG capsule Take 4 capsules (2,000 mg total) by mouth as needed. 30-60 minutes before dental procedure 02/26/22   Bensimhon, Shaune Pascal, MD  atorvastatin (LIPITOR) 80 MG tablet TAKE 1 TABLET BY MOUTH ONCE DAILY  AT 6PM. 08/31/21   Lelon Perla, MD  Carboxymethylcellulose Sodium (ARTIFICIAL TEARS OP) Place 1 drop into both eyes daily as needed (for dryness or irritation).    [provider]  eplerenone (INSPRA) 50 MG tablet Take 1 tablet (50 mg total) by mouth in the morning. 08/28/21   Bradie Lacock, Shaune Pascal, MD  ezetimibe (ZETIA) 10 MG tablet Take 1 tablet (10 mg total) by mouth daily. 08/09/21   Ronalda Walpole, Shaune Pascal, MD  gabapentin (NEURONTIN) 300 MG capsule Take 1 capsule (300 mg total) by mouth 3 (three) times daily. 01/23/22   Larey Dresser, MD  losartan (COZAAR) 25 MG  tablet Take 0.5 tablets (12.5 mg total) by mouth daily. 03/29/22   Lyda Jester M, PA-C  melatonin 5 MG TABS Take 1 tablet (5 mg total) by mouth at bedtime. Patient taking differently: Take 10 mg by mouth once a week. 12/11/20   Clegg, Amy D, NP  nystatin ointment (MYCOSTATIN) Apply 1 application topically 2 (two) times daily. 05/18/21   Suesan Mohrmann, Shaune Pascal, MD  ondansetron (ZOFRAN) 4 MG tablet Take 1 tablet (4 mg total) by mouth every 8 (eight) hours as needed for nausea or vomiting. 05/18/21   Yanci Bachtell, Shaune Pascal, MD  pantoprazole (PROTONIX) 40 MG tablet TAKE 1 TABLET BY MOUTH EVERY DAY 12/24/21   Yerachmiel Spinney, Shaune Pascal, MD  potassium chloride SA (KLOR-CON M) 20 MEQ tablet Take 1 tablet (20 mEq total) by mouth daily. 08/10/21   Larey Dresser, MD  sertraline (ZOLOFT) 100 MG tablet Take 1 tablet (100 mg total) by mouth daily. 10/11/21   Taelyn Broecker, Shaune Pascal, MD  tamsulosin (FLOMAX) 0.4 MG CAPS capsule Take 1 capsule (0.4 mg total) by mouth daily. 09/12/21   [name], MD  torsemide (DEMADEX) 20 MG tablet Take 1 tablet (20 mg total) by mouth as needed. 02/20/22   Banner Huckaba, Shaune Pascal, MD  traMADol (ULTRAM) 50 MG tablet TAKE 1-2 TABLETS BY MOUTH TWICE A DAY FOR 30 DAYS 04/12/22   [name], MD  warfarin (COUMADIN) 2.5 MG tablet Take 7.5 mg (3 tablets) every Monday/Friday and 5 mg (2 tablets) all other days or as instructed by LVAD clinic. 10/25/21   Doryce Mcgregory, Shaune Pascal, MD    Past Medical History: Past Medical History:  Diagnosis Date   AICD (automatic cardioverter/defibrillator) present 08/31/2019   Ankylosing spondylitis ([place])    Arthritis    BENIGN PROSTATIC HYPERTROPHY 06/07/2008   CHF (congestive heart failure) ([place])    COLONIC POLYPS, HX OF 06/07/2008   Coronary artery disease    Depression    H/O hiatal hernia    Heart failure ([place])    HYPERLIPIDEMIA 06/07/2008   HYPERTENSION 06/07/2008   Myocardial infarction [facility]) 2005   NSTEMI, s/p LAD stent   NEPHROLITHIASIS, HX OF  06/07/2008   STEMI (ST elevation myocardial infarction) ([place]) 04/27/2019    Past Surgical History: Past Surgical History:  Procedure Laterality Date   AORTIC VALVE REPLACEMENT N/A 04/06/2021   Procedure: AORTIC VALVE REPLACEMENT (AVR) WITH INSPIRIS RESILIA  AORTIC VALVE SIZE 25MM;  Surgeon: Gaye Pollack, MD;  Location: [facility];  Service: Open Heart Surgery;  Laterality: N/A;   COLONOSCOPY WITH PROPOFOL N/A 04/24/2020   Procedure: COLONOSCOPY WITH PROPOFOL;  Surgeon: [name], MD;  Location: [facility];  Service: Gastroenterology;  Laterality: N/A;   CORONARY ANGIOPLASTY WITH STENT PLACEMENT  2005   LAD stent, jailed diagonal   CORONARY/GRAFT ACUTE MI REVASCULARIZATION N/A 04/27/2019   Procedure: Coronary/Graft Acute  MI Revascularization;  Surgeon: [name], MD;  Location: [facility];  Service: Cardiovascular;  Laterality: N/A;   ICD IMPLANT  08/31/2019   ICD IMPLANT N/A 08/31/2019   Procedure: ICD IMPLANT;  Surgeon: [name], MD;  Location: [facility];  Service: Cardiovascular;  Laterality: N/A;   ICD LEAD REMOVAL N/A 12/04/2020   Procedure: ICD SYSTEM EXTRACTION;  Surgeon: Vickie Epley, MD;  Location: [facility];  Service: Cardiovascular;  Laterality: N/A;   INSERTION OF IMPLANTABLE LEFT VENTRICULAR ASSIST DEVICE N/A 04/06/2021   Procedure: INSERTION OF IMPLANTABLE LEFT VENTRICULAR ASSIST DEVICE;  Surgeon: Gaye Pollack, MD;  Location: [facility];  Service: Open Heart Surgery;  Laterality: N/A;  HM3   IR THORACENTESIS ASP PLEURAL SPACE W/IMG GUIDE  04/19/2021   IR THORACENTESIS ASP PLEURAL SPACE W/IMG GUIDE  04/20/2021   IR THORACENTESIS ASP PLEURAL SPACE W/IMG GUIDE  04/23/2021   LAMINECTOMY     lumbar   LEFT HEART CATH AND CORONARY ANGIOGRAPHY N/A 04/27/2019   Procedure: LEFT HEART CATH AND CORONARY ANGIOGRAPHY;  Surgeon: [name], MD;  Location: [facility];  Service: Cardiovascular;  Laterality: N/A;   LUMBAR FUSION   01/2012   POLYPECTOMY  04/24/2020   Procedure: POLYPECTOMY;  Surgeon: [name], MD;  Location: [facility];  Service: Gastroenterology;;   RIGHT HEART CATH N/A 04/27/2019   Procedure: RIGHT HEART CATH;  Surgeon: [name], Wes M, MD;  Location: [facility];  Service: Cardiovascular;  Laterality: N/A;   RIGHT HEART CATH N/A 03/28/2021   Procedure: RIGHT HEART CATH;  Surgeon: Jolaine Artist, MD;  Location: [facility];  Service: Cardiovascular;  Laterality: N/A;   RIGHT/LEFT HEART CATH AND CORONARY ANGIOGRAPHY N/A 11/29/2020   Procedure: RIGHT/LEFT HEART CATH AND CORONARY ANGIOGRAPHY;  Surgeon: Jolaine Artist, MD;  Location: [facility];  Service: Cardiovascular;  Laterality: N/A;   TEE WITHOUT CARDIOVERSION N/A 12/04/2020   Procedure: TRANSESOPHAGEAL ECHOCARDIOGRAM (TEE);  Surgeon: Vickie Epley, MD;  Location: [facility];  Service: Cardiovascular;  Laterality: N/A;   TEE WITHOUT CARDIOVERSION N/A 04/06/2021   Procedure: TRANSESOPHAGEAL ECHOCARDIOGRAM (TEE);  Surgeon: Gaye Pollack, MD;  Location: [facility];  Service: Open Heart Surgery;  Laterality: N/A;   TONSILLECTOMY     warthin tumor removal Left 2014    Family History: Family History  Problem Relation Age of Onset   Depression Father    Arthritis Sister        RA   Heart failure Mother    Other Neg Hx    Colon cancer Neg Hx    Esophageal cancer Neg Hx    Rectal cancer Neg Hx    Stomach cancer Neg Hx    Colon polyps Neg Hx     Social History: Social History   Socioeconomic History   Marital status: Single    Spouse name: Not on file   Number of children: Not on file   Years of education: Not on file   Highest education level: Some college, no degree  Occupational History   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   Occupation: Retired  Tobacco Use   Smoking status: Former    Packs/day: 1.00    Years: 40.00    Total pack years: 40.00    Types: Cigarettes    Quit date:  07/26/2019    Years since quitting: 2.7   Smokeless tobacco: Never   Tobacco comments:  1-1.5 pks per year x 40-45 yrs  Vaping Use   Vaping Use: Some days   Substances: CBD  Substance and Sexual Activity   Alcohol use: Yes    Alcohol/week: 7.0 standard drinks of alcohol    Types: 7 Shots of liquor per week    Comment: "2 vodka tonics a night"   Drug use: Yes    Comment: occasionally - CBD oil   Sexual activity: Not Currently  Other Topics Concern   Not on file  Social History Narrative   Lives in [place] alone      Social Determinants of Health   Financial Resource Strain: Low Risk  (09/10/2021)   Overall Financial Resource Strain (CARDIA)    Difficulty of Paying Living Expenses: Not very hard  Food Insecurity: No Food Insecurity (09/10/2021)   Hunger Vital Sign    Worried About Running Out of Food in the Last Year: Never true    Ran Out of Food in the Last Year: Never true  Transportation Needs: No Transportation Needs (09/10/2021)   PRAPARE - Hydrologist (Medical): No    Lack of Transportation (Non-Medical): No  Physical Activity: Sufficiently Active (09/10/2021)   Exercise Vital Sign    Days of Exercise per Week: 5 days    Minutes of Exercise per Session: 30 min  Recent Concern: Physical Activity - Insufficiently Active (07/19/2021)   Exercise Vital Sign    Days of Exercise per Week: 5 days    Minutes of Exercise per Session: 20 min  Stress: No Stress Concern Present (09/10/2021)   [place]    Feeling of Stress : Only a little  Social Connections: Socially Isolated (09/10/2021)   Social Connection and Isolation Panel [NHANES]    Frequency of Communication with Friends and Family: More than three times a week    Frequency of Social Gatherings with Friends and Family: Three times a week    Attends Religious Services: Never    Active Member of Clubs or Organizations: No     Attends Archivist Meetings: Never    Marital Status: Never married    Allergies:  No Known Allergies  Objective:    Mean arterial Pressure 80  Physical Exam    General:  Pale. No resp difficulty HEENT: Normal Neck: supple. JVP flat . Carotids 2+ bilat; no bruits. No lymphadenopathy or thyromegaly appreciated. Cor: Mechanical heart sounds with LVAD hum present. Lungs: Clear Abdomen: soft, nontender, nondistended. No hepatosplenomegaly. No bruits or masses. Good bowel sounds. Driveline: C/D/I; securement device intact and driveline incorporated Extremities: no cyanosis, clubbing, rash, edema Neuro: alert & orientedx3, cranial nerves grossly intact. moves all 4 extremities w/o difficulty. Affect pleasant   Telemetry   VF   EKG   Ventricular fibrilaltion   Labs    Basic Metabolic Panel: No results for input(s): "NA", "K", "CL", "CO2", "GLUCOSE", "BUN", "CREATININE", "CALCIUM", "MG", "PHOS" in the last 168 hours.  Liver Function Tests: No results for input(s): "AST", "ALT", "ALKPHOS", "BILITOT", "PROT", "ALBUMIN" in the last 168 hours. No results for input(s): "LIPASE", "AMYLASE" in the last 168 hours. No results for input(s): "AMMONIA" in the last 168 hours.  CBC: No results for input(s): "WBC", "NEUTROABS", "HGB", "HCT", "MCV", "PLT" in the last 168 hours.  Cardiac Enzymes: No results for input(s): "CKTOTAL", "CKMB", "CKMBINDEX", "TROPONINI" in the last 168 hours.  BNP: BNP (last 3 results) Recent Labs    04/26/21 0420 05/04/21 0410  05/08/21 0345  BNP 273.9* 156.7* 349.4*    ProBNP (last 3 results) No results for input(s): "PROBNP" in the last 8760 hours.   CBG: No results for input(s): "GLUCAP" in the last 168 hours.  Coagulation Studies: No results for input(s): "LABPROT", "INR" in the last 72 hours.  Other results: EKG: Ventricular fibrillation.   Imaging    No results found.  Assessment/Plan:    1. Ventricular fibrillation -  s/p emergent DC-CV - check labs - echo in am  2. Chronic systolic HFr EF due to iCM - Echo 05/19/20 EF 20-25% RV mildly HK. moderate AS  Mean gradient 13 AVA 1.2 cm2 DI 0.30 - s/p HM-III VAD + bioprosthetic AVR 04/06/21 - Baseline NYHA I. Volume status ok  - Hold Losartan 12.5 mg daily - Hold eplerenone 12.5 mg daily   3. HM-3 LVAD implant - VAD interrogated personally. Parameters stable. - ASA stopped 12/26 - INR pending Goal 2.0-3.0  - LDH pending  - MAPs ok - DL site ok   4. Severe low-flow aortic stenosis s/p AVR - s/p VAD/bioprosthetic AVR on 11/25   5. CAD  - s/p anterior STEMI (12/20). LHC showed chronically occluded RCA (with L>>R collaterals) and thrombotic occlusion of proximal LAD. Underwent PCI of LAD. - Now s/p VAD - check troponin. No evidence of ACS   6. Paroxysmal Atrial Fibrillation w/ RVR - back in NSR after Dc-CV of VF - Off amio - on warfarin.    7. 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  - Bcx recollected and pending  - Tissue sent to [facility] [place] for broad 16 S ribosomal sequencing for bacterial pathogens as well as T wet Bhilai Bartonella Brucella  - Ceftriaxone 2 g IV daily x 6 weeks. Completed May 22, 2021. PICC is out - ICD removed - No infectious sx. WBC ok    8. Ankylosing spondylitis - His local rheumatologist is hesitant to start remicade with VAD/infection risk - D/w [name] VAD program they have recommended f/u with Veva Holes, MD for 2nd opinion if needed.  - Currently feeling ok without it - PCP managing pain meds   CRITICAL CARE Performed by: Glori Bickers  Total critical care time: 60 minutes  Critical care time was exclusive of separately billable procedures and treating other patients.  Critical care was necessary to treat or prevent imminent or life-threatening deterioration.  Critical care was time spent personally by  me (independent of midlevel providers or residents) on the following activities: development of treatment plan with patient and/or surrogate as well as nursing, discussions with consultants, evaluation of patient's response to treatment, examination of patient, obtaining history from patient or surrogate, ordering and performing treatments and interventions, ordering and review of laboratory studies, ordering and review of radiographic studies, pulse oximetry and re-evaluation of patient's condition.   Glori Bickers, MD 04/22/2022, 8:17 PM  VAD Team Pager [phone] (7am - 7am) +++VAD ISSUES ONLY+++   Advanced Heart Failure Team Pager [phone] (M-F; 7a - 5p)  Please contact [facility] for night-coverage after hours (5p -7a ) and weekends on [email or web address] for all non- LVAD Issues

## 2022-04-22 NOTE — Telephone Encounter (Signed)
Received page from patient stating he was not feeling well that he is very diaphoretic, dizzy and short of breath. Patient noted that his flow had dropped to 2.7. Pt to call EMS and call VAD Coordinator back once they arrived. Upon EMS arrival VAD Coordinator paged by Paramedic stating patient visually short of breath and diaphoretic and current flow was 3.5 (normally 5) and speed was 5800. VAD Coordinator advised paramedic to obtain an EKG and start fluids. Dr.Bensimhon made aware. MD plans to meet patient in ED upon arrival. ED Charge nurse notified of patient incoming arrival.  Bobbye Morton RN, BSN VAD Coordinator 24/7 Pager [phone]

## 2022-04-22 NOTE — Progress Notes (Signed)
ANTICOAGULATION CONSULT NOTE - Initial Consult  Pharmacy Consult for Warfarin Indication:  LVAD  No Known Allergies  Patient Measurements:    Vital Signs: BP: 105/61 (12/11 2016) Pulse Rate: 109 (12/11 2035)  Labs: Recent Labs    04/22/22 1943  HGB 14.3  HCT 44.1  PLT 167  LABPROT 24.5*  INR 2.2*  CREATININE 1.24  TROPONINIHS 45*    CrCl cannot be calculated (Unknown ideal weight.).   Medical History: Past Medical History:  Diagnosis Date   AICD (automatic cardioverter/defibrillator) present 08/31/2019   Ankylosing spondylitis ([place])    Arthritis    BENIGN PROSTATIC HYPERTROPHY 06/07/2008   CHF (congestive heart failure) ([place])    COLONIC POLYPS, HX OF 06/07/2008   Coronary artery disease    Depression    H/O hiatal hernia    Heart failure ([place])    HYPERLIPIDEMIA 06/07/2008   HYPERTENSION 06/07/2008   Myocardial infarction [facility]) 2005   NSTEMI, s/p LAD stent   NEPHROLITHIASIS, HX OF 06/07/2008   STEMI (ST elevation myocardial infarction) ([place]) 04/27/2019    Medications:  (Not in a hospital admission)   Assessment: 66 yo M presented to ED w/ SOB that started while exercising at the gym. Pt found to be in VFib and was sedated and  cardioverted to NSR in ED. Patient has PMH of CAD s/p previous MI, severe AS, chronic diastolic HF s/p HM-3 LVAD (10/22). Pt is on warfarin for LVAD prior to admission. Pharmacy consulted to dose warfarin during admission.   Warfarin home regimen: 7.5mg  (2.5mg  x3 tabs) on MWFSu, and 5mg  (2.5mg  x2 tabs) on TRSa. Last dose was 5mg  on 04/21/22 at ~18:00.   INR = 2.2, therapeutic on admission Hgb 14.3, Plt 167 - stable on admission No s/sx of bleeding.   Goal of Therapy:  INR 2-2.5 Monitor platelets by anticoagulation protocol: Yes   Plan:  Give warfarin 7.5mg  PO x 1 tonight (per home regimen) Monitor daily CBC, INR, and for s/sx of bleeding.   Luisa Hart, PharmD, BCPS Clinical Pharmacist 04/22/2022 11:48 PM   Please refer  to [facility] for pharmacy phone number

## 2022-04-22 NOTE — ED Provider Notes (Signed)
[facility] EMERGENCY DEPARTMENT Provider Note   CSN: [id number] Arrival date & time: 04/22/22  1946     History  Chief Complaint  Patient presents with   Shortness of Breath    Brandon [name] is a 66 y.o. male.  HPI     Pt comes in with cc of shortness of breath. Patient has history of hypertension, hyperlipidemia, heart failure with LVAD and AICD placement who comes in with chief complaint of shortness of breath, near fainting.  Patient states that he had a normal day until about 5 PM when he started really feeling sweaty, nauseous and dizzy with shortness of breath and near fainting.  He was able to get in touch with the cardiology service, and was advised to come to the ER.  Patient denies any new medications, change in medications and has been compliant with his medicines.  He also denies any recent illnesses.  Home Medications Prior to Admission medications   Medication Sig Start Date End Date Taking? Authorizing Provider  acetaminophen (TYLENOL) 500 MG tablet Take 1,000 mg by mouth every 6 (six) hours as needed (pain.).    [provider]  amoxicillin (AMOXIL) 500 MG capsule Take 4 capsules (2,000 mg total) by mouth as needed. 30-60 minutes before dental procedure 02/26/22   Bensimhon, Shaune Pascal, MD  atorvastatin (LIPITOR) 80 MG tablet TAKE 1 TABLET BY MOUTH ONCE DAILY AT 6PM. 08/31/21   Lelon Perla, MD  Carboxymethylcellulose Sodium (ARTIFICIAL TEARS OP) Place 1 drop into both eyes daily as needed (for dryness or irritation).    [provider]  eplerenone (INSPRA) 50 MG tablet Take 1 tablet (50 mg total) by mouth in the morning. 08/28/21   Bensimhon, Shaune Pascal, MD  ezetimibe (ZETIA) 10 MG tablet Take 1 tablet (10 mg total) by mouth daily. 08/09/21   Bensimhon, Shaune Pascal, MD  gabapentin (NEURONTIN) 300 MG capsule Take 1 capsule (300 mg total) by mouth 3 (three) times daily. 01/23/22   Larey Dresser, MD  losartan (COZAAR) 25 MG  tablet Take 0.5 tablets (12.5 mg total) by mouth daily. 03/29/22   Lyda Jester M, PA-C  melatonin 5 MG TABS Take 1 tablet (5 mg total) by mouth at bedtime. Patient taking differently: Take 10 mg by mouth once a week. 12/11/20   Clegg, Amy D, NP  nystatin ointment (MYCOSTATIN) Apply 1 application topically 2 (two) times daily. 05/18/21   Bensimhon, Shaune Pascal, MD  ondansetron (ZOFRAN) 4 MG tablet Take 1 tablet (4 mg total) by mouth every 8 (eight) hours as needed for nausea or vomiting. 05/18/21   Bensimhon, Shaune Pascal, MD  pantoprazole (PROTONIX) 40 MG tablet TAKE 1 TABLET BY MOUTH EVERY DAY 12/24/21   Bensimhon, Shaune Pascal, MD  potassium chloride SA (KLOR-CON M) 20 MEQ tablet Take 1 tablet (20 mEq total) by mouth daily. 08/10/21   Larey Dresser, MD  sertraline (ZOLOFT) 100 MG tablet Take 1 tablet (100 mg total) by mouth daily. 10/11/21   Bensimhon, Shaune Pascal, MD  tamsulosin (FLOMAX) 0.4 MG CAPS capsule Take 1 capsule (0.4 mg total) by mouth daily. 09/12/21   [name], MD  torsemide (DEMADEX) 20 MG tablet Take 1 tablet (20 mg total) by mouth as needed. 02/20/22   Bensimhon, Shaune Pascal, MD  traMADol (ULTRAM) 50 MG tablet TAKE 1-2 TABLETS BY MOUTH TWICE A DAY FOR 30 DAYS 04/12/22   [name], MD  warfarin (COUMADIN) 2.5 MG tablet Take 7.5 mg (3  tablets) every Monday/Friday and 5 mg (2 tablets) all other days or as instructed by LVAD clinic. 10/25/21   Bensimhon, Shaune Pascal, MD      Allergies    Patient has no known allergies.    Review of Systems   Review of Systems  All other systems reviewed and are negative.   Physical Exam Updated Vital Signs BP 105/61   Pulse (!) 109   Resp 20   SpO2 100%  Physical Exam Vitals and nursing note reviewed.  Constitutional:      Appearance: He is well-developed.  HENT:     Head: Atraumatic.  Cardiovascular:     Rate and Rhythm: Normal rate.  Pulmonary:     Effort: Pulmonary effort is normal.     Breath sounds: No decreased breath sounds.   Musculoskeletal:     Cervical back: Neck supple.     Right lower leg: No edema.     Left lower leg: No edema.  Skin:    General: Skin is warm.  Neurological:     Mental Status: He is alert and oriented to person, place, and time.     ED Results / Procedures / Treatments   Labs (all labs ordered are listed, but only abnormal results are displayed) Labs Reviewed  BASIC METABOLIC PANEL - Abnormal; Notable for the following components:      Result Value   CO2 21 (*)    Glucose, Bld 121 (*)    All other components within normal limits  CBC WITH DIFFERENTIAL/PLATELET - Abnormal; Notable for the following components:   RDW 16.1 (*)    Neutro Abs 8.6 (*)    All other components within normal limits  PHOSPHORUS - Abnormal; Notable for the following components:   Phosphorus 4.7 (*)    All other components within normal limits  PROTIME-INR - Abnormal; Notable for the following components:   Prothrombin Time 24.5 (*)    INR 2.2 (*)    All other components within normal limits  TROPONIN I (HIGH SENSITIVITY) - Abnormal; Notable for the following components:   Troponin I (High Sensitivity) 45 (*)    All other components within normal limits  MAGNESIUM    EKG None  Radiology No results found.  Procedures .Critical Care  Performed by: Varney Biles, MD Authorized by: Varney Biles, MD   Critical care provider statement:    Critical care time (minutes):  32   Critical care was necessary to treat or prevent imminent or life-threatening deterioration of the following conditions:  Circulatory failure   Critical care was time spent personally by me on the following activities:  Development of treatment plan with patient or surrogate, discussions with consultants, evaluation of patient's response to treatment, examination of patient, ordering and review of laboratory studies, ordering and review of radiographic studies, ordering and performing treatments and interventions, pulse  oximetry, re-evaluation of patient's condition, review of old charts and obtaining history from patient or surrogate .Sedation  Date/Time: 04/22/2022 10:12 PM  Performed by: Varney Biles, MD Authorized by: Varney Biles, MD   Consent:    Consent obtained:  Written   Consent given by:  Patient   Risks discussed:  Allergic reaction, prolonged hypoxia resulting in organ damage, prolonged sedation necessitating reversal, respiratory compromise necessitating ventilatory assistance and intubation, vomiting, dysrhythmia, inadequate sedation and nausea Universal protocol:    Procedure explained and questions answered to patient or proxy's satisfaction: yes     Relevant documents present and verified: yes  Test results available: yes     Imaging studies available: yes     Required blood products, implants, devices, and special equipment available: yes     Site/side marked: yes     Immediately prior to procedure, a time out was called: yes     Patient identity confirmed:  Arm band Indications:    Procedure performed:  Cardioversion   Procedure necessitating sedation performed by:  Different physician Pre-sedation assessment:    Time since last food or drink:  8 hours   ASA classification: class 4 - patient with severe systemic disease that is a constant threat to life     Mouth opening:  3 or more finger widths   Thyromental distance:  4 finger widths   Mallampati score:  III - soft palate, base of uvula visible   Neck mobility: reduced     Pre-sedation assessments completed and reviewed: airway patency, cardiovascular function, hydration status, mental status, nausea/vomiting, respiratory function and temperature     Pre-sedation assessment completed:  04/22/2022 7:15 PM Immediate pre-procedure details:    Reassessment: Patient reassessed immediately prior to procedure     Reviewed: vital signs, relevant labs/tests and NPO status     Verified: bag valve mask available, emergency  equipment available, intubation equipment available and IV patency confirmed   Procedure details (see MAR for exact dosages):    Preoxygenation:  Nasal cannula   Sedation:  Etomidate and midazolam   Intended level of sedation: deep   Analgesia:  None   Intra-procedure monitoring:  Blood pressure monitoring, continuous capnometry, continuous pulse oximetry, cardiac monitor, frequent LOC assessments and frequent vital sign checks   Intra-procedure events: respiratory depression     Intra-procedure management:  Airway repositioning and supplemental oxygen   Total Provider sedation time (minutes):  25 Post-procedure details:    Post-sedation assessment completed:  04/22/2022 8:15 PM   Attendance: Constant attendance by certified staff until patient recovered     Recovery: Patient returned to pre-procedure baseline     Post-sedation assessments completed and reviewed: airway patency, cardiovascular function, mental status, nausea/vomiting, pain level and respiratory function     Patient is stable for discharge or admission: yes     Procedure completion:  Tolerated well, no immediate complications     Medications Ordered in ED Medications  0.9 %  sodium chloride infusion (has no administration in time range)  fentaNYL (SUBLIMAZE) 50 MCG/ML injection (has no administration in time range)  sertraline (ZOLOFT) tablet 100 mg (has no administration in time range)  traMADol (ULTRAM) tablet 100 mg (has no administration in time range)  acetaminophen (TYLENOL) tablet 1,000 mg (has no administration in time range)  atorvastatin (LIPITOR) tablet 80 mg (has no administration in time range)  ezetimibe (ZETIA) tablet 10 mg (has no administration in time range)  pantoprazole (PROTONIX) EC tablet 40 mg (has no administration in time range)  tamsulosin (FLOMAX) capsule 0.4 mg (has no administration in time range)  gabapentin (NEURONTIN) capsule 300 mg (has no administration in time range)  midazolam  (VERSED) injection (2 mg Intravenous Given 04/22/22 1959)  etomidate (AMIDATE) injection (1.5 mg Intravenous Given 04/22/22 2003)  PHENYLephrine 80 mcg/ml in normal saline Adult IV Push Syringe (For Blood Pressure Support) (160 mcg Intravenous Given 04/22/22 2000)    ED Course/ Medical Decision Making/ A&P                           Medical Decision Making 66 year old male  with history of CAD, CHF with LVAD and AICD comes in with chief complaint of shortness of breath, chest pain, near fainting.  He is noted to be in V-fib.  Patient hemodynamically stable secondary to LVAD.   Cardiology team at the bedside.  Patient was cardioverted by cardiology service will be sedated the patient.  No complications noted.  Patient reassessed at 9 PM, he is back to baseline.  Cardiology service to admit the patient to the hospital.  Differential diagnosis for V-fib includes medication noncompliance, electrolyte abnormality, dehydration, underlying infection, ACS.  Amount and/or Complexity of Data Reviewed Labs: ordered. ECG/medicine tests: ordered.  Risk Prescription drug management. Decision regarding hospitalization.     Final Clinical Impression(s) / ED Diagnoses Final diagnoses:  VF (ventricular fibrillation) [facility])    Rx / DC Orders ED Discharge Orders     None         Varney Biles, MD 04/22/22 2216

## 2022-04-22 NOTE — ED Triage Notes (Incomplete)
Pt arrived by EMS from home complaining of shortness of breath, weakness and low flow rates on his LVAD monitor.

## 2022-04-22 NOTE — ED Notes (Signed)
Sister Maurie Boettcher [phone] would like an update asap

## 2022-04-22 NOTE — CV Procedure (Signed)
    DIRECT CURRENT CARDIOVERSION  NAME:  Brandon [name]   MRN: [id number] DOB:  1956/02/19   ADMIT DATE: 04/22/2022   INDICATIONS: Ventricular fibrillation    PROCEDURE:   Informed consent was obtained prior to the procedure. The risks, benefits and alternatives for the procedure were discussed and the patient comprehended these risks. Once an appropriate time out was taken, the patient had the defibrillator pads placed in the anterior and posterior position. The patient then underwent sedation by the ED service. Once an appropriate level of sedation was achieved, the patient received a single biphasic, synchronized 200J shock with prompt conversion to sinus rhythm. No apparent complications.  Glori Bickers, MD  8:41 PM

## 2022-04-23 ENCOUNTER — Encounter (HOSPITAL_COMMUNITY): Payer: Medicare Other

## 2022-04-23 ENCOUNTER — Observation Stay (HOSPITAL_BASED_OUTPATIENT_CLINIC_OR_DEPARTMENT_OTHER): Payer: Medicare Other

## 2022-04-23 ENCOUNTER — Encounter (HOSPITAL_COMMUNITY): Payer: Self-pay

## 2022-04-23 DIAGNOSIS — Z95810 Presence of automatic (implantable) cardiac defibrillator: Secondary | ICD-10-CM | POA: Diagnosis not present

## 2022-04-23 DIAGNOSIS — I110 Hypertensive heart disease with heart failure: Secondary | ICD-10-CM | POA: Diagnosis not present

## 2022-04-23 DIAGNOSIS — I4901 Ventricular fibrillation: Secondary | ICD-10-CM

## 2022-04-23 DIAGNOSIS — Z79899 Other long term (current) drug therapy: Secondary | ICD-10-CM | POA: Diagnosis not present

## 2022-04-23 DIAGNOSIS — I5022 Chronic systolic (congestive) heart failure: Secondary | ICD-10-CM

## 2022-04-23 LAB — BASIC METABOLIC PANEL
Anion gap: 8 (ref 5–15)
BUN: 15 mg/dL (ref 8–23)
CO2: 26 mmol/L (ref 22–32)
Calcium: 9.1 mg/dL (ref 8.9–10.3)
Chloride: 105 mmol/L (ref 98–111)
Creatinine, Ser: 0.89 mg/dL (ref 0.61–1.24)
GFR, Estimated: >60 mL/min (ref >60)
Glucose, Bld: 138 mg/dL — ABNORMAL HIGH (ref 70–99)
Potassium: 4 mmol/L (ref 3.5–5.1)
Sodium: 139 mmol/L (ref 135–145)

## 2022-04-23 LAB — CBC
HCT: 39.7 % (ref 39.0–52.0)
Hemoglobin: 13 g/dL (ref 13.0–17.0)
MCH: 26.4 pg (ref 26.0–34.0)
MCHC: 32.7 g/dL (ref 30.0–36.0)
MCV: 80.7 fL (ref 80.0–100.0)
Platelets: 184 10*3/uL (ref 150–400)
RBC: 4.92 MIL/uL (ref 4.22–5.81)
RDW: 16.3 % — ABNORMAL HIGH (ref 11.5–15.5)
WBC: 6.8 10*3/uL (ref 4.0–10.5)
nRBC: 0 % (ref 0.0–0.2)

## 2022-04-23 LAB — ECHOCARDIOGRAM COMPLETE
Area-P 1/2: 5.79 cm2
Height: 72 in
S' Lateral: 5.3 cm
Weight: 3292.8 oz

## 2022-04-23 LAB — TROPONIN I (HIGH SENSITIVITY)
Troponin I (High Sensitivity): 335 ng/L (ref <18)
Troponin I (High Sensitivity): 437 ng/L (ref <18)

## 2022-04-23 LAB — PROTIME-INR
INR: 2.1 — ABNORMAL HIGH (ref 0.8–1.2)
Prothrombin Time: 23.3 seconds — ABNORMAL HIGH (ref 11.4–15.2)

## 2022-04-23 LAB — MRSA NEXT GEN BY PCR, NASAL: MRSA by PCR Next Gen: NOT DETECTED

## 2022-04-23 LAB — LACTATE DEHYDROGENASE: LDH: 165 U/L (ref 98–192)

## 2022-04-23 MED ORDER — WARFARIN SODIUM 7.5 MG PO TABS: 7.5000 mg | ORAL_TABLET | Freq: Once | ORAL | Status: DC

## 2022-04-23 MED ORDER — PANTOPRAZOLE SODIUM 40 MG PO TBEC: 40.0000 mg | DELAYED_RELEASE_TABLET | Freq: Two times a day (BID) | ORAL | 3 refills | Status: DC

## 2022-04-23 MED ORDER — LOSARTAN POTASSIUM 25 MG PO TABS: 12.5000 mg | ORAL_TABLET | Freq: Every day | ORAL | Status: DC

## 2022-04-23 MED ORDER — WARFARIN SODIUM 7.5 MG PO TABS
7.5000 mg | ORAL_TABLET | Freq: Once | ORAL | Status: AC
  Administered 2022-04-23: 7.5 mg via ORAL
  Filled 2022-04-23: qty 1

## 2022-04-23 MED ORDER — PANTOPRAZOLE SODIUM 40 MG PO TBEC: 40.0000 mg | DELAYED_RELEASE_TABLET | Freq: Two times a day (BID) | ORAL | Status: DC

## 2022-04-23 NOTE — Discharge Summary (Signed)
Advanced Heart Failure Team  Discharge Summary   Patient ID: Brandon [name] MRN: [id number], DOB/AGE: April 05, 1956 66 y.o. Admit date: 04/22/2022 D/C date:     04/23/2022   Primary Discharge Diagnoses:  Ventricular fibrillation   Secondary Discharge Diagnoses:  Chronic systolic HFrEF due to iCM HM3 LVAD Severe low-flow aortic stenosis s/p AVR CAD Paroxysmal atrial fibrillation w/ RVR Possible "acute AoV endocarditis" Ankylosing spondylitis  Hospital Course:  Brandon [name] is a 66 y.o.male (originally from [place], [place]) with h/o CAD s/p previous MI, severe AS, chronic systolic HF s/p HM-3 VAD in 10/22.   Patient had been doing very well at home and had been very active, going to the gym. The day of admission he was sitting in his recliner and all of a sudden he got very diaphoretic, started to see black and had feelings of impending doom. His neighbor helped call EMS and was transferred to the ED.   In the ED patient was found to be in VF. He was sedated and defibrillated x1 with return to NSR. Suspect it may have been 2/2 dehydration/hypotension? Takes torsemide almost every day to help with breathing while at the gym but reports adequate fluid intake per patient. Reports low flows noted on his VAD control day of incident.   Had echo prior to d/c that Dr. [name] reviewed. Had reflux like pain this morning, resolved with rest. Increased PPI at discharge.   Patient feels back to baseline. If he has recurrent VT/VF discussed with EP,will need ICD placement.   Pt will continue to be followed closely in the VAD/HF clinic. Dr [name] evaluated and deemed appropriate for discharge. F/u scheduled.   See below for detailed problem list: 1. Ventricular fibrillation - s/p emergent DC-CV - echo this morning, read pending - remains in NSR 2. Chronic systolic HFr EF due to iCM - Echo 05/19/20 EF 20-25% RV mildly HK. moderate AS  Mean gradient 13 AVA 1.2 cm2 DI 0.30 - s/p HM-III VAD + bioprosthetic AVR  04/06/21 - Baseline NYHA I. Volume status ok  - Restart Losartan 12.5 mg daily and eplerenone 12.5 mg daily tomorrow 3. HM-3 LVAD implant - VAD interrogated personally. Parameters stable. - ASA stopped 12/26 - INR 2.1, Goal 2.0-3.0  - LDH 165, MAPs and DL site ok 4. Severe low-flow aortic stenosis s/p AVR - s/p VAD/bioprosthetic AVR on 11/25 5. CAD  - s/p anterior STEMI (12/20). LHC showed chronically occluded RCA (with L>>R collaterals) and thrombotic occlusion of proximal LAD. Underwent PCI of LAD. - Now s/p VAD - check troponin. No evidence of ACS 6. Paroxysmal Atrial Fibrillation w/ RVR - back in NSR after DC-CV of VF - Off amio - on warfarin.  7. 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  - Tissue sent to [facility] in [place] for broad 16 S ribosomal sequencing for bacterial pathogens as well as T wet Bhilai Bartonella Brucella  - Ceftriaxone 2 g IV daily x 6 weeks. Completed May 22, 2021. PICC is out - ICD removed - No infectious sx. WBC ok  8. Ankylosing spondylitis - His local rheumatologist is hesitant to start remicade with VAD/infection risk - D/w [name] VAD program they have recommended f/u with Veva Holes, MD for 2nd opinion if needed.  - Currently feeling ok without it - PCP managing pain meds  LVAD Interrogation HM III:  Flow 5.2 liters/min, speed 5800, power 4.8, PI 3.5  Back-up speed:  5500  Discharge Weight Range: 93.4kg  Discharge Vitals: Blood pressure (!) 110/93, pulse (!) 113, temperature (!) 97 F (36.1 C), temperature source Oral, resp. rate 16, height 6' (1.829 m), weight 93.4 kg, SpO2 93 %.  Labs: Lab Results  Component Value Date   WBC 6.8 04/23/2022   HGB 13.0 04/23/2022   HCT 39.7 04/23/2022   MCV 80.7 04/23/2022   PLT 184 04/23/2022    Recent Labs  Lab 04/23/22 0807  NA 139  K 4.0  CL 105  CO2 26  BUN 15  CREATININE 0.89  CALCIUM 9.1  GLUCOSE  138*   Lab Results  Component Value Date   CHOL 122 02/06/2021   HDL 37 (L) 02/06/2021   LDLCALC 60 02/06/2021   TRIG 124 02/06/2021   BNP (last 3 results) Recent Labs    04/26/21 0420 05/04/21 0410 05/08/21 0345  BNP 273.9* 156.7* 349.4*    ProBNP (last 3 results) No results for input(s): "PROBNP" in the last 8760 hours.   Diagnostic Studies/Procedures   Echocardiogram: final read pending  Discharge Medications   Allergies as of 04/23/2022   No Known Allergies      Medication List     STOP taking these medications    amoxicillin 500 MG capsule Commonly known as: AMOXIL       TAKE these medications    acetaminophen 500 MG tablet Commonly known as: TYLENOL Take 1,000 mg by mouth every 6 (six) hours as needed (pain.).   ARTIFICIAL TEARS OP Place 1 drop into both eyes daily as needed (for dryness or irritation).   atorvastatin 80 MG tablet Commonly known as: LIPITOR TAKE 1 TABLET BY MOUTH ONCE DAILY AT 6PM. What changed: See the new instructions.   eplerenone 50 MG tablet Commonly known as: INSPRA Take 1 tablet (50 mg total) by mouth in the morning.   ezetimibe 10 MG tablet Commonly known as: ZETIA Take 1 tablet (10 mg total) by mouth daily.   gabapentin 300 MG capsule Commonly known as: NEURONTIN Take 1 capsule (300 mg total) by mouth 3 (three) times daily.   losartan 25 MG tablet Commonly known as: COZAAR Take 0.5 tablets (12.5 mg total) by mouth daily.   melatonin 5 MG Tabs Take 1 tablet (5 mg total) by mouth at bedtime. What changed:  how much to take when to take this   nystatin ointment Commonly known as: MYCOSTATIN Apply 1 application topically 2 (two) times daily.   ondansetron 4 MG tablet Commonly known as: Zofran Take 1 tablet (4 mg total) by mouth every 8 (eight) hours as needed for nausea or vomiting.   pantoprazole 40 MG tablet Commonly known as: PROTONIX Take 1 tablet (40 mg total) by mouth 2 (two) times daily. What  changed: when to take this   potassium chloride SA 20 MEQ tablet Commonly known as: KLOR-CON M Take 1 tablet (20 mEq total) by mouth daily.   sertraline 100 MG tablet Commonly known as: ZOLOFT Take 1 tablet (100 mg total) by mouth daily.   tamsulosin 0.4 MG Caps capsule Commonly known as: FLOMAX Take 1 capsule (0.4 mg total) by mouth daily.   torsemide 20 MG tablet Commonly known as: DEMADEX Take 1 tablet (20 mg total) by mouth as needed.   traMADol 50 MG tablet Commonly known as: ULTRAM TAKE 1-2 TABLETS BY MOUTH TWICE A DAY FOR 30 DAYS   warfarin 2.5 MG tablet Commonly known as: Coumadin Take as directed. If you are unsure how to  take this medication, talk to your nurse or doctor. Original instructions: Take 7.5 mg (3 tablets) every Monday/Friday and 5 mg (2 tablets) all other days or as instructed by LVAD clinic. What changed:  how much to take how to take this when to take this additional instructions        Disposition   The patient will be discharged in stable condition to home. Discharge Instructions     (HEART FAILURE PATIENTS) Call MD:  Anytime you have any of the following symptoms: 1) 3 pound weight gain in 24 hours or 5 pounds in 1 week 2) shortness of breath, with or without a dry hacking cough 3) swelling in the hands, feet or stomach 4) if you have to sleep on extra pillows at night in order to breathe.   Complete by: As directed    Avoid straining   Complete by: As directed    Diet - low sodium heart healthy   Complete by: As directed    Heart Failure patients record your daily weight using the same scale at the same time of day   Complete by: As directed    INR  Goal: 2 - 3   Complete by: As directed    Goal: 2 - 3   Increase activity slowly   Complete by: As directed    STOP any activity that causes chest pain, shortness of breath, dizziness, sweating, or exessive weakness   Complete by: As directed    Speed Settings:   Complete by: As  directed    Fixed 5800 RPM Low 5500 RPM       Follow-up Information     [facility] Follow up on 06/03/2022.   Specialty: Cardiology Why: Follow up in the [place] clinic 06/03/21 at 1000am Entrance C, free valet Please bring all medications with you Contact information: [name] [id number] [place] [phone]                  Duration of Discharge Encounter: Greater than 35 minutes   Signed, Earnie Larsson AGACNP-BC  04/23/2022, 3:08 PM

## 2022-04-23 NOTE — Progress Notes (Addendum)
Advanced Heart Failure VAD Team Note  PCP-Cardiologist: [name], MD   Subjective:    Feels great this morning. No complaints. Denies CP and SOB.   Getting labs and echo this am.   LVAD INTERROGATION:  HeartMate III LVAD:   Flow 5.2 liters/min, speed 5800, power 4.8, PI 3.5 x3 PI events.    Objective:    Vital Signs:   Temp:  [97.6 F (36.4 C)-98.6 F (37 C)] 97.6 F (36.4 C) (12/12 0500) Pulse Rate:  [79-111] 79 (12/12 0500) Resp:  [18-20] 18 (12/12 0500) BP: (91-107)/(61-89) 99/70 (12/12 0500) SpO2:  [92 %-100 %] 92 % (12/12 0500) Weight:  [93.4 kg] 93.4 kg (12/11 2356) Last BM Date : 04/22/22 Mean arterial Pressure 80s  Intake/Output:   Intake/Output Summary (Last 24 hours) at 04/23/2022 0734 Last data filed at 04/23/2022 0300 Gross per 24 hour  Intake 290.44 ml  Output 600 ml  Net -309.56 ml     Physical Exam   General:  Well appearing. No resp difficulty HEENT: Normal Neck: supple. JVP ~8. Carotids 2+ bilat; no bruits. No lymphadenopathy or thyromegaly appreciated. Cor: Mechanical heart sounds with LVAD hum present. Lungs: Clear Abdomen: soft, nontender, nondistended. No hepatosplenomegaly. No bruits or masses. Good bowel sounds. Driveline: C/D/I; securement device intact and driveline incorporated Extremities: no cyanosis, clubbing, rash, edema Neuro: alert & orientedx3, cranial nerves grossly intact. moves all 4 extremities w/o difficulty. Affect pleasant   Telemetry   NSR 90s  EKG    No new EKG to review  Labs   Basic Metabolic Panel: Recent Labs  Lab 04/22/22 1943  NA 135  K 3.8  CL 100  CO2 21*  GLUCOSE 121*  BUN 20  CREATININE 1.24  CALCIUM 10.2  MG 1.9  PHOS 4.7*    Liver Function Tests: No results for input(s): "AST", "ALT", "ALKPHOS", "BILITOT", "PROT", "ALBUMIN" in the last 168 hours. No results for input(s): "LIPASE", "AMYLASE" in the last 168 hours. No results for input(s): "AMMONIA" in the last 168  hours.  CBC: Recent Labs  Lab 04/22/22 1943  WBC 10.5  NEUTROABS 8.6*  HGB 14.3  HCT 44.1  MCV 82.3  PLT 167    INR: Recent Labs  Lab 04/18/22 0000 04/22/22 1943  INR 2.8 2.2*    Other results: EKG:    Imaging   No results found.   Medications:     Scheduled Medications:  atorvastatin  80 mg Oral Daily   ezetimibe  10 mg Oral Daily   fentaNYL       gabapentin  300 mg Oral TID   pantoprazole  40 mg Oral Daily   sertraline  100 mg Oral Daily   tamsulosin  0.4 mg Oral Daily   Warfarin - Pharmacist Dosing Inpatient   Does not apply q1600    Infusions:  sodium chloride      PRN Medications: acetaminophen, fentaNYL, traMADol   Patient Profile   Hooper is a 66 y.o.male (originally from [place], [place]) with h/o CAD s/p previous MI, severe AS, chronic systolic HF s/p HM-3 VAD in 10/22 . Presented via EMS 2/2 VF requiring emergent DCCV in ED.   Assessment/Plan:   1. Ventricular fibrillation - s/p emergent DC-CV - echo pending this morning - remains in NSR   2. Chronic systolic HFr EF due to iCM - Echo 05/19/20 EF 20-25% RV mildly HK. moderate AS  Mean gradient 13 AVA 1.2 cm2 DI 0.30 - s/p HM-III VAD + bioprosthetic AVR 04/06/21 - Baseline  NYHA I. Volume status ok  - Hold Losartan 12.5 mg daily - Hold eplerenone 12.5 mg daily   3. HM-3 LVAD implant - VAD interrogated personally. Parameters stable. - ASA stopped 12/26 - INR pending, 2.2 yesterday, Goal 2.0-3.0  - LDH pending  - MAPs ok - DL site ok   4. Severe low-flow aortic stenosis s/p AVR - s/p VAD/bioprosthetic AVR on 11/25   5. CAD  - s/p anterior STEMI (12/20). LHC showed chronically occluded RCA (with L>>R collaterals) and thrombotic occlusion of proximal LAD. Underwent PCI of LAD. - Now s/p VAD - check troponin. No evidence of ACS   6. Paroxysmal Atrial Fibrillation w/ RVR - back in NSR after Dc-CV of VF - Off amio - on warfarin.    7. 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  - Tissue sent to [facility] in  for broad 16 S ribosomal sequencing for bacterial pathogens as well as T wet Bhilai Bartonella Brucella  - Ceftriaxone 2 g IV daily x 6 weeks. Completed May 22, 2021. PICC is out - ICD removed - No infectious sx. WBC ok    8. Ankylosing spondylitis - His local rheumatologist is hesitant to start remicade with VAD/infection risk - D/w [name] VAD program they have recommended f/u with Veva Holes, MD for 2nd opinion if needed.  - Currently feeling ok without it - PCP managing pain meds   I reviewed the LVAD parameters from today, and compared the results to the patient's prior recorded data.  No programming changes were made.  The LVAD is functioning within specified parameters.  The patient performs LVAD self-test daily.  LVAD interrogation was negative for any significant power changes, alarms or PI events/speed drops.  LVAD equipment check completed and is in good working order.  Back-up equipment present.   LVAD education done on emergency procedures and precautions and reviewed exit site care.  Length of Stay: [place], NP 04/23/2022, 7:34 AM  VAD Team --- VAD ISSUES ONLY--- Pager [phone] (7am - 7am)  Advanced Heart Failure Team  Pager [phone] (M-F; 7a - 5p)  Please contact [facility] for night-coverage after hours (5p -7a ) and weekends on [email or web address]   Patient seen and examined with the above-signed Advanced Practice Provider and/or Housestaff. I personally reviewed laboratory data, imaging studies and relevant notes. I independently examined the patient and formulated the important aspects of the plan. I have edited the note to reflect any of my changes or salient points. I have personally discussed the plan with the patient and/or family.  Feels great this am. Remains in NSR. No CP or SOB. Echo pending.   General:  NAD.  HEENT: normal   Neck: supple. JVP not elevated.  Carotids 2+ bilat; no bruits. No lymphadenopathy or thryomegaly appreciated. Cor: LVAD hum.  Lungs: Clear. Abdomen: obese soft, nontender, non-distended. No hepatosplenomegaly. No bruits or masses. Good bowel sounds. Driveline site clean. Anchor in place.  Extremities: no cyanosis, clubbing, rash. Warm no edema  Neuro: alert & oriented x 3. No focal deficits. Moves all 4 without problem   Stable post DC-CV. Electrolytes supped. Await echo. I d/w EP. Will continue to follow. If has recurrent VT/VF will need ICD. INR 2.1.   Home later today if echo ok.    Glori Bickers, MD  9:24 AM

## 2022-04-23 NOTE — Progress Notes (Addendum)
CRITICAL VALUE STICKER  CRITICAL VALUE: troponin 335  MD NOTIFIED: [facility]- DR Mayl.   Paged  @1 :36 of 04/23/2022. Dr acknowledged.  BP 101/85   Pulse 82   Temp 98.6 F (37 C)   Resp 20   SpO2 96%  No chest pain. Denies complains. Plan of care ongoing.

## 2022-04-23 NOTE — Progress Notes (Signed)
Mobility Specialist Progress Note    04/23/22 1452  Mobility  Activity Ambulated independently in hallway  Level of Assistance Standby assist, set-up cues, supervision of patient - no hands on  Assistive Device None  Distance Ambulated (ft) 420 ft  Activity Response Tolerated well  Mobility Referral Yes  $Mobility charge 1 Mobility   Pre-Mobility: 93 HR, 95% SpO2 During Mobility: 132 HR Post-Mobility: 112 HR  Pt received in bed and agreeable. No complaints on walk. Took x1 standing rest break. Returned to sitting EOB with call bell in reach.    Hildred Alamin Mobility Specialist  Please Psychologist, sport and exercise or Rehab Office at [phone]

## 2022-04-23 NOTE — Progress Notes (Signed)
  Echocardiogram 2D Echocardiogram has been performed.  Darlina Sicilian M 04/23/2022, 1:49 PM

## 2022-04-23 NOTE — Progress Notes (Signed)
LVAD Coordinator Rounding Note:  Admitted 04/22/22 due to VF requiring urgent cardioversion.   HM III LVAD implanted on 04/06/21 by Dr. Cyndia Bent under Destination Therapy criteria.   Pt paged the VAD pager yesterday evening with acute onset SOB, diaphoresis and sense of doom. Flows down on VAD to 2.7. On arrival to ED pt was in VF. Dr [name] met pt in ED and after sedation was defibrillated back to NSR.  Pt laying in bed this morning. Pt reports he is feeling much better today. He did have an episode of SOB, and chest pain that he reports at 'heartburn'. EKG done and STAT troponin ordered. VAD parameters normal and VS unchanged.   Echo ordered for today. Pt may be able to d/c later today if all tests normal.  Vital signs: Temp: 97 HR: 86 Doppler: 82 Automatic BP: 110/93 (101) O2 Sat: 93% on RA Wt: 205.8lbs    LVAD interrogation reveals:  Speed: 5800 Flow: 5.2 Power: 3.5w PI: 4.8  Alarms: none Events: 3 PI events so far today; 50+ yesterday Hematocrit: 44  Fixed speed: 5800 Low speed limit: 5500  Drive Line:  Due today. Bedside nurse will perform. Pt given 8 weekly kits for home use today.  Labs:  LDH trend: 165  INR trend: 2.1  Anticoagulation Plan: -INR Goal: 2 - 2.5 -ASA Dose: 81 mg daily  Blood Products:   Intra-op: - 04/06/21>>4 PRBCs, 4 FFP, 1263 cc cell saver  Post-op: - 04/06/21>>1 unit Plts - 04/07/21>>1 unit PRBC  Device: N/A  Arrythmias: VF cardioverted 04/23/22  Plan/Recommendations:  1. Call VAD Coordinator for any VAD equipment or drive line issues. 2. Dr [name] ok with hosp f/u in January. Pt scheduled for 1/22 @ 1000 am  Tanda Rockers RN [place] Coordinator  Office: [phone]  24/7 Pager: [phone]

## 2022-04-23 NOTE — Progress Notes (Signed)
ANTICOAGULATION CONSULT NOTE   Pharmacy Consult for Warfarin Indication:  LVAD  No Known Allergies  Patient Measurements: Height: 6' (182.9 cm) Weight: 93.4 kg (205 lb 12.8 oz) IBW/kg (Calculated) : 77.6  Vital Signs: Temp: 97 F (36.1 C) (12/12 0806) Temp Source: Oral (12/12 0806) BP: 110/93 (12/12 1031) Pulse Rate: 113 (12/12 0806)  Labs: Recent Labs    04/22/22 1943 04/23/22 0021 04/23/22 0807  HGB 14.3  --  13.0  HCT 44.1  --  39.7  PLT 167  --  184  LABPROT 24.5*  --  23.3*  INR 2.2*  --  2.1*  CREATININE 1.24  --  0.89  TROPONINIHS 45* 335*  --      Estimated Creatinine Clearance: 94.3 mL/min (by C-G formula based on SCr of 0.89 mg/dL).   Medical History: Past Medical History:  Diagnosis Date   AICD (automatic cardioverter/defibrillator) present 08/31/2019   Ankylosing spondylitis ([place])    Arthritis    BENIGN PROSTATIC HYPERTROPHY 06/07/2008   CHF (congestive heart failure) ([place])    COLONIC POLYPS, HX OF 06/07/2008   Coronary artery disease    Depression    H/O hiatal hernia    Heart failure ([place])    HYPERLIPIDEMIA 06/07/2008   HYPERTENSION 06/07/2008   Myocardial infarction [facility]) 2005   NSTEMI, s/p LAD stent   NEPHROLITHIASIS, HX OF 06/07/2008   STEMI (ST elevation myocardial infarction) ([place]) 04/27/2019    Assessment: 66 yo M presented to ED w/ SOB that started while exercising at the gym. Pt found to be in VFib and was sedated and  cardioverted to NSR in ED. Patient has PMH of CAD s/p previous MI, severe AS, chronic diastolic HF s/p HM-3 LVAD (10/22). Pt is on warfarin for LVAD prior to admission. Pharmacy consulted to dose warfarin during admission.   Warfarin home regimen: 7.5mg  (2.5mg  x3 tabs) on MWFSu, and 5mg  (2.5mg  x2 tabs) on TRSa. Last dose was 5mg  on 04/21/22 at ~18:00.   INR = 2.1 Hgb 13, Plt 184 - stable on admission No s/sx of bleeding.   Goal of Therapy:  INR 2-2.5 Monitor platelets by anticoagulation protocol: Yes   Plan:   Give warfarin 7.5mg  PO x 1 tonight then continue home regimen starting 12/13 Monitor daily CBC, INR, and for s/sx of bleeding.   Erin Hearing PharmD., BCPS Clinical Pharmacist 04/23/2022 11:47 AM

## 2022-04-25 ENCOUNTER — Ambulatory Visit (HOSPITAL_COMMUNITY): Payer: Self-pay | Admitting: Pharmacist

## 2022-04-25 LAB — POCT INR: INR: 4 — AB (ref 2.0–3.0)

## 2022-04-30 ENCOUNTER — Ambulatory Visit (HOSPITAL_BASED_OUTPATIENT_CLINIC_OR_DEPARTMENT_OTHER)
Admission: RE | Admit: 2022-04-30 | Discharge: 2022-04-30 | Disposition: A | Discharge status: Home / Self Care | Payer: Medicare Other | Source: Ambulatory Visit | Attending: Family Medicine | Admitting: Family Medicine

## 2022-04-30 DIAGNOSIS — Z87891 Personal history of nicotine dependence: Secondary | ICD-10-CM

## 2022-04-30 DIAGNOSIS — Z122 Encounter for screening for malignant neoplasm of respiratory organs: Secondary | ICD-10-CM

## 2022-05-02 ENCOUNTER — Ambulatory Visit (HOSPITAL_COMMUNITY): Payer: Self-pay | Admitting: Pharmacist

## 2022-05-02 LAB — POCT INR: INR: 2 (ref 2.0–3.0)

## 2022-05-06 ENCOUNTER — Encounter (HOSPITAL_COMMUNITY)
Admission: EM | Disposition: A | Discharge status: Home / Self Care | Payer: Self-pay | Source: Home / Self Care | Attending: Internal Medicine

## 2022-05-06 ENCOUNTER — Emergency Department (HOSPITAL_COMMUNITY): Payer: Medicare Other

## 2022-05-06 ENCOUNTER — Inpatient Hospital Stay (HOSPITAL_COMMUNITY): Payer: Medicare Other

## 2022-05-06 ENCOUNTER — Inpatient Hospital Stay (HOSPITAL_COMMUNITY)
Admission: EM | Admit: 2022-05-06 | Discharge: 2022-05-12 | DRG: 280 | Disposition: A | Discharge status: Home / Self Care | Payer: Medicare Other | Attending: Cardiology | Admitting: Cardiology

## 2022-05-06 DIAGNOSIS — I2102 ST elevation (STEMI) myocardial infarction involving left anterior descending coronary artery: Secondary | ICD-10-CM

## 2022-05-06 DIAGNOSIS — Z95811 Presence of heart assist device: Secondary | ICD-10-CM

## 2022-05-06 DIAGNOSIS — I2510 Atherosclerotic heart disease of native coronary artery without angina pectoris: Secondary | ICD-10-CM | POA: Diagnosis present

## 2022-05-06 DIAGNOSIS — Y831 Surgical operation with implant of artificial internal device as the cause of abnormal reaction of the patient, or of later complication, without mention of misadventure at the time of the procedure: Secondary | ICD-10-CM | POA: Diagnosis present

## 2022-05-06 DIAGNOSIS — E785 Hyperlipidemia, unspecified: Secondary | ICD-10-CM | POA: Diagnosis present

## 2022-05-06 DIAGNOSIS — Z79899 Other long term (current) drug therapy: Secondary | ICD-10-CM

## 2022-05-06 DIAGNOSIS — I5023 Acute on chronic systolic (congestive) heart failure: Secondary | ICD-10-CM | POA: Diagnosis present

## 2022-05-06 DIAGNOSIS — N400 Enlarged prostate without lower urinary tract symptoms: Secondary | ICD-10-CM | POA: Diagnosis present

## 2022-05-06 DIAGNOSIS — Z7901 Long term (current) use of anticoagulants: Secondary | ICD-10-CM | POA: Diagnosis not present

## 2022-05-06 DIAGNOSIS — I4901 Ventricular fibrillation: Secondary | ICD-10-CM | POA: Diagnosis present

## 2022-05-06 DIAGNOSIS — R072 Precordial pain: Principal | ICD-10-CM

## 2022-05-06 DIAGNOSIS — I2101 ST elevation (STEMI) myocardial infarction involving left main coronary artery: Secondary | ICD-10-CM | POA: Diagnosis not present

## 2022-05-06 DIAGNOSIS — I2109 ST elevation (STEMI) myocardial infarction involving other coronary artery of anterior wall: Secondary | ICD-10-CM | POA: Diagnosis present

## 2022-05-06 DIAGNOSIS — Z8261 Family history of arthritis: Secondary | ICD-10-CM

## 2022-05-06 DIAGNOSIS — Z955 Presence of coronary angioplasty implant and graft: Secondary | ICD-10-CM | POA: Diagnosis not present

## 2022-05-06 DIAGNOSIS — Z87891 Personal history of nicotine dependence: Secondary | ICD-10-CM

## 2022-05-06 DIAGNOSIS — I2582 Chronic total occlusion of coronary artery: Secondary | ICD-10-CM | POA: Diagnosis present

## 2022-05-06 DIAGNOSIS — I252 Old myocardial infarction: Secondary | ICD-10-CM

## 2022-05-06 DIAGNOSIS — Z8249 Family history of ischemic heart disease and other diseases of the circulatory system: Secondary | ICD-10-CM

## 2022-05-06 DIAGNOSIS — U071 COVID-19: Secondary | ICD-10-CM | POA: Diagnosis not present

## 2022-05-06 DIAGNOSIS — Z953 Presence of xenogenic heart valve: Secondary | ICD-10-CM

## 2022-05-06 DIAGNOSIS — Z87442 Personal history of urinary calculi: Secondary | ICD-10-CM

## 2022-05-06 DIAGNOSIS — I480 Paroxysmal atrial fibrillation: Secondary | ICD-10-CM | POA: Diagnosis present

## 2022-05-06 DIAGNOSIS — M459 Ankylosing spondylitis of unspecified sites in spine: Secondary | ICD-10-CM | POA: Diagnosis present

## 2022-05-06 DIAGNOSIS — I7100 Dissection of unspecified site of aorta: Secondary | ICD-10-CM | POA: Insufficient documentation

## 2022-05-06 DIAGNOSIS — I350 Nonrheumatic aortic (valve) stenosis: Secondary | ICD-10-CM | POA: Diagnosis present

## 2022-05-06 DIAGNOSIS — Z952 Presence of prosthetic heart valve: Secondary | ICD-10-CM

## 2022-05-06 DIAGNOSIS — T82867A Thrombosis of cardiac prosthetic devices, implants and grafts, initial encounter: Secondary | ICD-10-CM | POA: Diagnosis not present

## 2022-05-06 DIAGNOSIS — I340 Nonrheumatic mitral (valve) insufficiency: Secondary | ICD-10-CM | POA: Diagnosis not present

## 2022-05-06 DIAGNOSIS — I110 Hypertensive heart disease with heart failure: Secondary | ICD-10-CM | POA: Diagnosis present

## 2022-05-06 DIAGNOSIS — Z86010 Personal history of colonic polyps: Secondary | ICD-10-CM

## 2022-05-06 DIAGNOSIS — R911 Solitary pulmonary nodule: Secondary | ICD-10-CM | POA: Diagnosis present

## 2022-05-06 DIAGNOSIS — M1990 Unspecified osteoarthritis, unspecified site: Secondary | ICD-10-CM | POA: Diagnosis present

## 2022-05-06 DIAGNOSIS — Q2112 Patent foramen ovale: Secondary | ICD-10-CM

## 2022-05-06 DIAGNOSIS — I214 Non-ST elevation (NSTEMI) myocardial infarction: Secondary | ICD-10-CM | POA: Diagnosis not present

## 2022-05-06 DIAGNOSIS — Z8679 Personal history of other diseases of the circulatory system: Secondary | ICD-10-CM

## 2022-05-06 HISTORY — PX: RIGHT HEART CATH AND CORONARY ANGIOGRAPHY: CATH118264

## 2022-05-06 LAB — POCT I-STAT 7, (LYTES, BLD GAS, ICA,H+H)
Acid-base deficit: 3 mmol/L — ABNORMAL HIGH (ref 0.0–2.0)
Acid-base deficit: 6 mmol/L — ABNORMAL HIGH (ref 0.0–2.0)
Bicarbonate: 21.5 mmol/L (ref 20.0–28.0)
Bicarbonate: 18.2 mmol/L — ABNORMAL LOW (ref 20.0–28.0)
Calcium, Ion: 1.16 mmol/L (ref 1.15–1.40)
Calcium, Ion: 0.77 mmol/L — CL (ref 1.15–1.40)
HCT: 45 % (ref 39.0–52.0)
HCT: 36 % — ABNORMAL LOW (ref 39.0–52.0)
Hemoglobin: 15.3 g/dL (ref 13.0–17.0)
Hemoglobin: 12.2 g/dL — ABNORMAL LOW (ref 13.0–17.0)
O2 Saturation: 91 %
O2 Saturation: 92 %
Potassium: 3.7 mmol/L (ref 3.5–5.1)
Potassium: 2.8 mmol/L — ABNORMAL LOW (ref 3.5–5.1)
Sodium: 137 mmol/L (ref 135–145)
Sodium: 146 mmol/L — ABNORMAL HIGH (ref 135–145)
TCO2: 23 mmol/L (ref 22–32)
TCO2: 19 mmol/L — ABNORMAL LOW (ref 22–32)
pCO2 arterial: 36.5 mmHg (ref 32–48)
pCO2 arterial: 31.4 mmHg — ABNORMAL LOW (ref 32–48)
pH, Arterial: 7.378 (ref 7.35–7.45)
pH, Arterial: 7.371 (ref 7.35–7.45)
pO2, Arterial: 61 mmHg — ABNORMAL LOW (ref 83–108)
pO2, Arterial: 65 mmHg — ABNORMAL LOW (ref 83–108)

## 2022-05-06 LAB — CBC WITH DIFFERENTIAL/PLATELET
Abs Immature Granulocytes: 0.06 10*3/uL (ref 0.00–0.07)
Basophils Absolute: 0 10*3/uL (ref 0.0–0.1)
Basophils Relative: 0 %
Eosinophils Absolute: 0.1 10*3/uL (ref 0.0–0.5)
Eosinophils Relative: 1 %
HCT: 43.8 % (ref 39.0–52.0)
Hemoglobin: 13.8 g/dL (ref 13.0–17.0)
Immature Granulocytes: 0 %
Lymphocytes Relative: 5 %
Lymphs Abs: 0.7 10*3/uL (ref 0.7–4.0)
MCH: 26.1 pg (ref 26.0–34.0)
MCHC: 31.5 g/dL (ref 30.0–36.0)
MCV: 82.8 fL (ref 80.0–100.0)
Monocytes Absolute: 0.9 10*3/uL (ref 0.1–1.0)
Monocytes Relative: 6 %
Neutro Abs: 13.3 10*3/uL — ABNORMAL HIGH (ref 1.7–7.7)
Neutrophils Relative %: 88 %
Platelets: 186 10*3/uL (ref 150–400)
RBC: 5.29 MIL/uL (ref 4.22–5.81)
RDW: 16.4 % — ABNORMAL HIGH (ref 11.5–15.5)
WBC: 15.1 10*3/uL — ABNORMAL HIGH (ref 4.0–10.5)
nRBC: 0 % (ref 0.0–0.2)

## 2022-05-06 LAB — POCT I-STAT EG7
Acid-base deficit: 1 mmol/L (ref 0.0–2.0)
Bicarbonate: 24.8 mmol/L (ref 20.0–28.0)
Calcium, Ion: 1.15 mmol/L (ref 1.15–1.40)
HCT: 45 % (ref 39.0–52.0)
Hemoglobin: 15.3 g/dL (ref 13.0–17.0)
O2 Saturation: 60 %
Potassium: 3.8 mmol/L (ref 3.5–5.1)
Sodium: 134 mmol/L — ABNORMAL LOW (ref 135–145)
TCO2: 26 mmol/L (ref 22–32)
pCO2, Ven: 44.4 mmHg (ref 44–60)
pH, Ven: 7.356 (ref 7.25–7.43)
pO2, Ven: 33 mmHg (ref 32–45)

## 2022-05-06 LAB — COMPREHENSIVE METABOLIC PANEL
ALT: 20 U/L (ref 0–44)
AST: 35 U/L (ref 15–41)
Albumin: 3.6 g/dL (ref 3.5–5.0)
Alkaline Phosphatase: 123 U/L (ref 38–126)
Anion gap: 8 (ref 5–15)
BUN: 21 mg/dL (ref 8–23)
CO2: 21 mmol/L — ABNORMAL LOW (ref 22–32)
Calcium: 8.8 mg/dL — ABNORMAL LOW (ref 8.9–10.3)
Chloride: 102 mmol/L (ref 98–111)
Creatinine, Ser: 1.03 mg/dL (ref 0.61–1.24)
GFR, Estimated: >60 mL/min (ref >60)
Glucose, Bld: 124 mg/dL — ABNORMAL HIGH (ref 70–99)
Potassium: 4.9 mmol/L (ref 3.5–5.1)
Sodium: 131 mmol/L — ABNORMAL LOW (ref 135–145)
Total Bilirubin: 1.7 mg/dL — ABNORMAL HIGH (ref 0.3–1.2)
Total Protein: 7 g/dL (ref 6.5–8.1)

## 2022-05-06 LAB — PROTIME-INR
INR: 2.1 — ABNORMAL HIGH (ref 0.8–1.2)
Prothrombin Time: 23.7 seconds — ABNORMAL HIGH (ref 11.4–15.2)

## 2022-05-06 LAB — GLUCOSE, CAPILLARY: Glucose-Capillary: 122 mg/dL — ABNORMAL HIGH (ref 70–99)

## 2022-05-06 LAB — TROPONIN I (HIGH SENSITIVITY): Troponin I (High Sensitivity): 48 ng/L — ABNORMAL HIGH (ref <18)

## 2022-05-06 SURGERY — RIGHT HEART CATH AND CORONARY ANGIOGRAPHY
Anesthesia: LOCAL

## 2022-05-06 MED ORDER — FENTANYL CITRATE (PF) 100 MCG/2ML IJ SOLN
INTRAMUSCULAR | Status: AC
  Filled 2022-05-06: qty 2

## 2022-05-06 MED ORDER — ASPIRIN 81 MG PO CHEW
324.0000 mg | CHEWABLE_TABLET | Freq: Once | ORAL | Status: AC
  Administered 2022-05-06: 324 mg via ORAL
  Filled 2022-05-06: qty 4

## 2022-05-06 MED ORDER — FUROSEMIDE 10 MG/ML IJ SOLN
INTRAMUSCULAR | Status: AC
  Filled 2022-05-06: qty 4

## 2022-05-06 MED ORDER — FUROSEMIDE 10 MG/ML IJ SOLN
40.0000 mg | Freq: Once | INTRAMUSCULAR | Status: AC
  Administered 2022-05-06: 40 mg via INTRAVENOUS

## 2022-05-06 MED ORDER — MELATONIN 5 MG PO TABS
5.0000 mg | ORAL_TABLET | Freq: Every day | ORAL | Status: DC
  Administered 2022-05-07 – 2022-05-11 (×5): 5 mg via ORAL
  Filled 2022-05-06 (×5): qty 1

## 2022-05-06 MED ORDER — FENTANYL CITRATE (PF) 100 MCG/2ML IJ SOLN
INTRAMUSCULAR | Status: DC | PRN
  Administered 2022-05-06: 25 ug via INTRAVENOUS

## 2022-05-06 MED ORDER — HEPARIN (PORCINE) IN NACL 1000-0.9 UT/500ML-% IV SOLN
INTRAVENOUS | Status: AC
  Filled 2022-05-06: qty 1000

## 2022-05-06 MED ORDER — SERTRALINE HCL 100 MG PO TABS
100.0000 mg | ORAL_TABLET | Freq: Every day | ORAL | Status: DC
  Administered 2022-05-07 – 2022-05-12 (×6): 100 mg via ORAL
  Filled 2022-05-06 (×7): qty 1

## 2022-05-06 MED ORDER — EZETIMIBE 10 MG PO TABS
10.0000 mg | ORAL_TABLET | Freq: Every day | ORAL | Status: DC
  Administered 2022-05-07 – 2022-05-12 (×6): 10 mg via ORAL
  Filled 2022-05-06 (×7): qty 1

## 2022-05-06 MED ORDER — SODIUM CHLORIDE 0.9 % IV SOLN: INTRAVENOUS | Status: DC

## 2022-05-06 MED ORDER — VERAPAMIL HCL 2.5 MG/ML IV SOLN
INTRAVENOUS | Status: AC
  Filled 2022-05-06: qty 2

## 2022-05-06 MED ORDER — LIDOCAINE HCL (PF) 1 % IJ SOLN
INTRAMUSCULAR | Status: DC | PRN
  Administered 2022-05-06: 3 mL
  Administered 2022-05-06: 2 mL
  Administered 2022-05-06: 5 mL

## 2022-05-06 MED ORDER — TAMSULOSIN HCL 0.4 MG PO CAPS
0.4000 mg | ORAL_CAPSULE | Freq: Every day | ORAL | Status: DC
  Administered 2022-05-07 – 2022-05-12 (×6): 0.4 mg via ORAL
  Filled 2022-05-06 (×7): qty 1

## 2022-05-06 MED ORDER — PANTOPRAZOLE SODIUM 40 MG PO TBEC
40.0000 mg | DELAYED_RELEASE_TABLET | Freq: Two times a day (BID) | ORAL | Status: DC
  Administered 2022-05-07 – 2022-05-12 (×12): 40 mg via ORAL
  Filled 2022-05-06 (×12): qty 1

## 2022-05-06 MED ORDER — ATORVASTATIN CALCIUM 80 MG PO TABS
80.0000 mg | ORAL_TABLET | Freq: Every day | ORAL | Status: DC
  Administered 2022-05-07 – 2022-05-11 (×5): 80 mg via ORAL
  Filled 2022-05-06 (×6): qty 1

## 2022-05-06 MED ORDER — LABETALOL HCL 5 MG/ML IV SOLN: 10.0000 mg | INTRAVENOUS | Status: AC | PRN

## 2022-05-06 MED ORDER — IOHEXOL 350 MG/ML SOLN
INTRAVENOUS | Status: DC | PRN
  Administered 2022-05-06: 80 mL via INTRA_ARTERIAL

## 2022-05-06 MED ORDER — ONDANSETRON HCL 4 MG/2ML IJ SOLN
4.0000 mg | Freq: Four times a day (QID) | INTRAMUSCULAR | Status: DC | PRN
  Administered 2022-05-07: 4 mg via INTRAVENOUS
  Filled 2022-05-06: qty 2

## 2022-05-06 MED ORDER — SODIUM CHLORIDE 0.9 % IV SOLN: INTRAVENOUS | Status: AC

## 2022-05-06 MED ORDER — SODIUM CHLORIDE 0.9% FLUSH
3.0000 mL | Freq: Two times a day (BID) | INTRAVENOUS | Status: DC
  Administered 2022-05-07 – 2022-05-11 (×8): 3 mL via INTRAVENOUS

## 2022-05-06 MED ORDER — SODIUM CHLORIDE 0.9 % IV SOLN: 250.0000 mL | INTRAVENOUS | Status: DC | PRN

## 2022-05-06 MED ORDER — HEPARIN SODIUM (PORCINE) 1000 UNIT/ML IJ SOLN
INTRAMUSCULAR | Status: DC | PRN
  Administered 2022-05-06: 2500 [IU] via INTRAVENOUS

## 2022-05-06 MED ORDER — LIDOCAINE HCL (PF) 1 % IJ SOLN
INTRAMUSCULAR | Status: AC
  Filled 2022-05-06: qty 30

## 2022-05-06 MED ORDER — ONDANSETRON HCL 4 MG PO TABS: 4.0000 mg | ORAL_TABLET | Freq: Three times a day (TID) | ORAL | Status: DC | PRN

## 2022-05-06 MED ORDER — HEPARIN SODIUM (PORCINE) 1000 UNIT/ML IJ SOLN
INTRAMUSCULAR | Status: AC
  Filled 2022-05-06: qty 10

## 2022-05-06 MED ORDER — ASPIRIN 81 MG PO CHEW: 324.0000 mg | CHEWABLE_TABLET | Freq: Once | ORAL | Status: DC

## 2022-05-06 MED ORDER — ACETAMINOPHEN 325 MG PO TABS
650.0000 mg | ORAL_TABLET | ORAL | Status: DC | PRN
  Administered 2022-05-07 – 2022-05-12 (×8): 650 mg via ORAL
  Filled 2022-05-06 (×8): qty 2

## 2022-05-06 MED ORDER — FUROSEMIDE 10 MG/ML IJ SOLN
INTRAMUSCULAR | Status: DC | PRN
  Administered 2022-05-06: 40 mg via INTRAVENOUS

## 2022-05-06 MED ORDER — LOSARTAN POTASSIUM 25 MG PO TABS: 12.5000 mg | ORAL_TABLET | Freq: Every day | ORAL | Status: DC

## 2022-05-06 MED ORDER — NITROGLYCERIN 0.4 MG SL SUBL: 0.4000 mg | SUBLINGUAL_TABLET | SUBLINGUAL | Status: DC | PRN

## 2022-05-06 MED ORDER — LACTATED RINGERS IV BOLUS
1000.0000 mL | Freq: Once | INTRAVENOUS | Status: AC
  Administered 2022-05-06: 1000 mL via INTRAVENOUS

## 2022-05-06 MED ORDER — VERAPAMIL HCL 2.5 MG/ML IV SOLN
INTRAVENOUS | Status: DC | PRN
  Administered 2022-05-06: 10 mL via INTRA_ARTERIAL

## 2022-05-06 MED ORDER — HEPARIN (PORCINE) IN NACL 1000-0.9 UT/500ML-% IV SOLN
INTRAVENOUS | Status: DC | PRN
  Administered 2022-05-06 (×2): 500 mL

## 2022-05-06 MED ORDER — VASOPRESSIN 20 UNITS/100 ML INFUSION FOR SHOCK
0.0000 [IU]/min | INTRAVENOUS | Status: DC
  Administered 2022-05-06: 0.03 [IU]/min via INTRAVENOUS
  Filled 2022-05-06: qty 100

## 2022-05-06 MED ORDER — SODIUM CHLORIDE 0.9% FLUSH: 3.0000 mL | INTRAVENOUS | Status: DC | PRN

## 2022-05-06 MED ORDER — SODIUM CHLORIDE 0.9 % IV SOLN
INTRAVENOUS | Status: AC | PRN
  Administered 2022-05-06: 10 mL/h via INTRAVENOUS

## 2022-05-06 MED ORDER — NITROGLYCERIN IN D5W 200-5 MCG/ML-% IV SOLN
0.0000 ug/min | INTRAVENOUS | Status: DC
  Administered 2022-05-06: 10 ug/min via INTRAVENOUS
  Filled 2022-05-06: qty 250

## 2022-05-06 MED ORDER — IOHEXOL 350 MG/ML SOLN
75.0000 mL | Freq: Once | INTRAVENOUS | Status: AC | PRN
  Administered 2022-05-06: 75 mL via INTRAVENOUS

## 2022-05-06 MED ORDER — WARFARIN SODIUM 7.5 MG PO TABS: 7.5000 mg | ORAL_TABLET | ORAL | Status: DC

## 2022-05-06 SURGICAL SUPPLY — 18 items
CATH 5FR JL3.5 JR4 ANG PIG MP (CATHETERS) IMPLANT
CATH LAUNCHER 6FR JL4 (CATHETERS) IMPLANT
CATH SWAN GANZ 7F STRAIGHT (CATHETERS) IMPLANT
DEVICE RAD COMP TR BAND LRG (VASCULAR PRODUCTS) IMPLANT
ELECT DEFIB PAD ADLT CADENCE (PAD) IMPLANT
GLIDESHEATH SLEND SS 6F .021 (SHEATH) IMPLANT
GUIDEWIRE INQWIRE 1.5J.035X260 (WIRE) IMPLANT
INQWIRE 1.5J .035X260CM (WIRE) ×1
KIT ENCORE 26 ADVANTAGE (KITS) IMPLANT
KIT HEART LEFT (KITS) ×1 IMPLANT
KIT MICROPUNCTURE NIT STIFF (SHEATH) IMPLANT
PACK CARDIAC CATHETERIZATION (CUSTOM PROCEDURE TRAY) ×1 IMPLANT
SHEATH PINNACLE 7F 10CM (SHEATH) IMPLANT
SHEATH PINNACLE 8F 10CM (SHEATH) IMPLANT
SHEATH PROBE COVER 6X72 (BAG) IMPLANT
SLEEVE REPOSITIONING LENGTH 30 (MISCELLANEOUS) IMPLANT
TRANSDUCER W/STOPCOCK (MISCELLANEOUS) ×1 IMPLANT
TUBING CIL FLEX 10 FLL-RA (TUBING) ×1 IMPLANT

## 2022-05-06 NOTE — Interval H&P Note (Signed)
History and Physical Interval Note:  05/06/2022 9:42 PM  Brandon [name]  has presented today for surgery, with the diagnosis of STEMI.  The various methods of treatment have been discussed with the patient and family. After consideration of risks, benefits and other options for treatment, the patient has consented to  Procedure(s): Coronary/Graft Acute MI Revascularization (N/A) LEFT HEART CATH AND CORONARY ANGIOGRAPHY (N/A) as a surgical intervention.  The patient's history has been reviewed, patient examined, no change in status, stable for surgery.  I have reviewed the patient's chart and labs.  Questions were answered to the patient's satisfaction.     [name]

## 2022-05-06 NOTE — Progress Notes (Signed)
LVAD Coordinator ED Encounter  Brandon [name] a 66 y.o. male that presented to [facility] ER today due to crushing CP, SOB and vomiting, and ST elevation. He has a past medical history of AICD (automatic cardioverter/defibrillator) present (08/31/2019), Ankylosing spondylitis ([place]), Arthritis, BENIGN PROSTATIC HYPERTROPHY (06/07/2008), CHF (congestive heart failure) ([place]), COLONIC POLYPS, HX OF (06/07/2008), Coronary artery disease, Depression, H/O hiatal hernia, Heart failure ([place]), HYPERLIPIDEMIA (06/07/2008), HYPERTENSION (06/07/2008), Myocardial infarction ([name]) (2005), NEPHROLITHIASIS, HX OF (06/07/2008), and STEMI (ST elevation myocardial infarction) ([place]) (04/27/2019).[name]   LVAD is a HM3 and was implanted on 04/06/21 by Dr Cyndia Bent.   Pt paged VAD coordinator this evening c/o crushing CP, vomiting, and SOB. Pt was instructed to call 911.   [place] found pt to have major ST elevations. VAD coordinator met pt in cath lab.   Vital signs: HR: 135 Doppler MAP: 90 Automated BP:100/72 (82) O2 Sat: UTO  LVAD interrogation reveals:  Speed: 5800 Flow: 5.3 Power:  4.9 PI: 2.7  Alarms: none Events: 50 + PI today  Drive Line: CDI. Dressing due tomorrow 05/07/22.  Significant Events with LVAD: Presented to the ER on 12/11 with VF. Shocked back to NSR. No CP or ECG changes at that time.   Dr [name] at bedside. VAD coordinator will remain with pt.   Tanda Rockers, RN VAD Coordinator   Office: [phone] 24/7 Emergency VAD Pager: [phone]

## 2022-05-06 NOTE — Progress Notes (Signed)
RT called to cath lab for possible intubation/vs BIPAP. Vitals signs stable. RT transported with RN to CT to Monitor patient.

## 2022-05-06 NOTE — ED Notes (Signed)
Patient able to chew ASA without difficulty.

## 2022-05-06 NOTE — ED Notes (Signed)
Doppler Systolic MAP was 90.

## 2022-05-06 NOTE — H&P (Signed)
Advanced Heart Failure VAD History and Physical Note   PCP-Cardiologist: [name], MD   Reason for Admission: Ventricular fibrillation   HPI:     Brandon [name] is a 66 y.o.male (originally from [place], [place]) with h/o CAD s/p previous MI, severe AS, chronic systolic HF s/p HM-3 VAD in 10/22   He was admitted 04/27/19 with anterior ST elevation. Taken to cath lab which showed chronically occluded RCA (L>>R collaterals) and thrombotic occlusion of proximal LAD. Underwent PCI of LAD. Echo w/ reduced LVEF 30-35% w/ apical aneurysm. RV ok. Post cath, he required milrinone for cardiogenic shock.  Underwent outpatient [facility] 11/29/20 as part of VAD w/u. Post procedure, developed severe rigors and fever. Admitted to ICU Blood Cx + strep. 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  Likely low-flow low gradient AS. Underwent ICD extraction..    Admitted for cardiogenic shock in 10/22. He underwent placement of HM3 VAD and aortic valve replacement with 25 mm Edwards Inspiris Resilia valve on 04/06/21. Surgical path came back 11/28 with evidence of possible "acute aortic valve endocarditis". Started on IV ceftriaxone x 6 weeks to cover Streptococcus gordonae that was isolated previously   Presented to the ER on 12/11 with VF. Shocked back to NSR. No CP or ECG changes at the time  This afternoon developed severe CP, nausea and SOB. Called EMS. Initial ECG in field with massive ST elevation. Brought to ER with waxing/waning CP and shoulder pain. ECG with improved ST elevation.   MAPs 90. CXR with pulmonary edema.   Given ASA 81 x1. IV lasix 40 and NTG at 10. No heparin given pending INR    LVAD INTERROGATION:  HeartMate III LVAD:  Flow 4.5 liters/min, speed 5800, power 5.1, PI 8.0 multiple PI events.     Review of Systems: [y] = yes, [ ]  = no   General: Weight gain [ ] ; Weight loss [ ] ; Anorexia [ ] ; Fatigue [ ] ; Fever [ ] ; Chills [ ] ; Weakness [  ]  Cardiac: Chest pain/pressure [ y]; Resting SOB [ y]; Exertional SOB [ ] ; Orthopnea [ ] ; Pedal Edema [ ] ; Palpitations [ ] ; Syncope [ ] ; Presyncope [name] ]; Paroxysmal nocturnal dyspnea[ ]   Pulmonary: Cough [ ] ; Wheezing[ ] ; Hemoptysis[ ] ; Sputum [ ] ; Snoring [ ]   GI: Vomiting[ ] ; Dysphagia[ ] ; Melena[ ] ; Hematochezia [ ] ; Heartburn[ ] ; Abdominal pain [ ] ; Constipation [ ] ; Diarrhea [ ] ; BRBPR [ ]   GU: Hematuria[ ] ; Dysuria [ ] ; Nocturia[ ]   Vascular: Pain in legs with walking [ ] ; Pain in feet with lying flat [ ] ; Non-healing sores [ ] ; Stroke [ ] ; TIA [ ] ; Slurred speech [ ] ;  Neuro: Headaches[ ] ; Vertigo[ ] ; Seizures[ ] ; Paresthesias[ ] ;Blurred vision [ ] ; Diplopia [ ] ; Vision changes [ ]   Ortho/Skin: Arthritis [ y]; Joint pain [ ] y; Muscle pain [ ] ; Joint swelling [ ] ; Back Pain [ ] ; Rash [ ]   Psych: Depression[ ] ; Anxiety[ ]   Heme: Bleeding problems [ ] ; Clotting disorders [ ] ; Anemia [ ]   Endocrine: Diabetes [ ] ; Thyroid dysfunction[ ]     Home Medications Prior to Admission medications   Medication Sig Start Date End Date Taking? Authorizing Provider  acetaminophen (TYLENOL) 500 MG tablet Take 1,000 mg by mouth every 6 (six) hours as needed (pain.).    [provider]  amoxicillin (AMOXIL) 500 MG capsule Take 4 capsules (2,000 mg total) by mouth as needed. 30-60 minutes before dental procedure  02/26/22   Cormac Wint, Shaune Pascal, MD  atorvastatin (LIPITOR) 80 MG tablet TAKE 1 TABLET BY MOUTH ONCE DAILY AT 6PM. 08/31/21   Lelon Perla, MD  Carboxymethylcellulose Sodium (ARTIFICIAL TEARS OP) Place 1 drop into both eyes daily as needed (for dryness or irritation).    [provider]  eplerenone (INSPRA) 50 MG tablet Take 1 tablet (50 mg total) by mouth in the morning. 08/28/21   Polette Nofsinger, Shaune Pascal, MD  ezetimibe (ZETIA) 10 MG tablet Take 1 tablet (10 mg total) by mouth daily. 08/09/21   Meilani Edmundson, Shaune Pascal, MD  gabapentin (NEURONTIN) 300 MG capsule Take 1 capsule (300 mg  total) by mouth 3 (three) times daily. 01/23/22   Larey Dresser, MD  losartan (COZAAR) 25 MG tablet Take 0.5 tablets (12.5 mg total) by mouth daily. 03/29/22   Lyda Jester M, PA-C  melatonin 5 MG TABS Take 1 tablet (5 mg total) by mouth at bedtime. Patient taking differently: Take 10 mg by mouth once a week. 12/11/20   Clegg, Amy D, NP  nystatin ointment (MYCOSTATIN) Apply 1 application topically 2 (two) times daily. 05/18/21   Jahrel Borthwick, Shaune Pascal, MD  ondansetron (ZOFRAN) 4 MG tablet Take 1 tablet (4 mg total) by mouth every 8 (eight) hours as needed for nausea or vomiting. 05/18/21   Almarie Kurdziel, Shaune Pascal, MD  pantoprazole (PROTONIX) 40 MG tablet TAKE 1 TABLET BY MOUTH EVERY DAY 12/24/21   Lamyiah Crawshaw, Shaune Pascal, MD  potassium chloride SA (KLOR-CON M) 20 MEQ tablet Take 1 tablet (20 mEq total) by mouth daily. 08/10/21   Larey Dresser, MD  sertraline (ZOLOFT) 100 MG tablet Take 1 tablet (100 mg total) by mouth daily. 10/11/21   Anida Deol, Shaune Pascal, MD  tamsulosin (FLOMAX) 0.4 MG CAPS capsule Take 1 capsule (0.4 mg total) by mouth daily. 09/12/21   [name], MD  torsemide (DEMADEX) 20 MG tablet Take 1 tablet (20 mg total) by mouth as needed. 02/20/22   Julann Mcgilvray, Shaune Pascal, MD  traMADol (ULTRAM) 50 MG tablet TAKE 1-2 TABLETS BY MOUTH TWICE A DAY FOR 30 DAYS 04/12/22   [name], MD  warfarin (COUMADIN) 2.5 MG tablet Take 7.5 mg (3 tablets) every Monday/Friday and 5 mg (2 tablets) all other days or as instructed by LVAD clinic. 10/25/21   Sian Joles, Shaune Pascal, MD    Past Medical History: Past Medical History:  Diagnosis Date   AICD (automatic cardioverter/defibrillator) present 08/31/2019   Ankylosing spondylitis ([place])    Arthritis    BENIGN PROSTATIC HYPERTROPHY 06/07/2008   CHF (congestive heart failure) ([place])    COLONIC POLYPS, HX OF 06/07/2008   Coronary artery disease    Depression    H/O hiatal hernia    Heart failure ([place])    HYPERLIPIDEMIA 06/07/2008   HYPERTENSION  06/07/2008   Myocardial infarction [facility]) 2005   NSTEMI, s/p LAD stent   NEPHROLITHIASIS, HX OF 06/07/2008   STEMI (ST elevation myocardial infarction) ([place]) 04/27/2019    Past Surgical History: Past Surgical History:  Procedure Laterality Date   AORTIC VALVE REPLACEMENT N/A 04/06/2021   Procedure: AORTIC VALVE REPLACEMENT (AVR) WITH INSPIRIS RESILIA  AORTIC VALVE SIZE 25MM;  Surgeon: Gaye Pollack, MD;  Location: [place];  Service: Open Heart Surgery;  Laterality: N/A;   COLONOSCOPY WITH PROPOFOL N/A 04/24/2020   Procedure: COLONOSCOPY WITH PROPOFOL;  Surgeon: [name], MD;  [facility]: [facility];  Service: Gastroenterology;  Laterality: N/A;   CORONARY ANGIOPLASTY WITH STENT PLACEMENT  2005   LAD stent, jailed diagonal   CORONARY/GRAFT ACUTE MI REVASCULARIZATION N/A 04/27/2019   Procedure: Coronary/Graft Acute MI Revascularization;  Surgeon: [name], MD;  Location: [place] [facility];  Service: Cardiovascular;  Laterality: N/A;   ICD IMPLANT  08/31/2019   ICD IMPLANT N/A 08/31/2019   Procedure: ICD IMPLANT;  Surgeon: [name], MD;  Location: [place] CV [facility];  Service: Cardiovascular;  Laterality: N/A;   ICD LEAD REMOVAL N/A 12/04/2020   Procedure: ICD SYSTEM EXTRACTION;  Surgeon: Vickie Epley, MD;  Location: [place];  Service: Cardiovascular;  Laterality: N/A;   INSERTION OF IMPLANTABLE LEFT VENTRICULAR ASSIST DEVICE N/A 04/06/2021   Procedure: INSERTION OF IMPLANTABLE LEFT VENTRICULAR ASSIST DEVICE;  Surgeon: Gaye Pollack, MD;  Location: [place];  Service: Open Heart Surgery;  Laterality: N/A;  HM3   IR THORACENTESIS ASP PLEURAL SPACE W/IMG GUIDE  04/19/2021   IR THORACENTESIS ASP PLEURAL SPACE W/IMG GUIDE  04/20/2021   IR THORACENTESIS ASP PLEURAL SPACE W/IMG GUIDE  04/23/2021   LAMINECTOMY     lumbar   LEFT HEART CATH AND CORONARY ANGIOGRAPHY N/A 04/27/2019   Procedure: LEFT HEART CATH AND CORONARY ANGIOGRAPHY;  Surgeon: [name], MD;   Location: [place] [facility];  Service: Cardiovascular;  Laterality: N/A;   LUMBAR FUSION  01/2012   POLYPECTOMY  04/24/2020   Procedure: POLYPECTOMY;  Surgeon: [name], MD;  [facility];  Service: Gastroenterology;;   RIGHT HEART CATH N/A 04/27/2019   Procedure: RIGHT HEART CATH;  Surgeon: [name], Saunders M, MD;  Location: [place] [facility];  Service: Cardiovascular;  Laterality: N/A;   RIGHT HEART CATH N/A 03/28/2021   Procedure: RIGHT HEART CATH;  Surgeon: Jolaine Artist, MD;  Location: [place] [facility];  Service: Cardiovascular;  Laterality: N/A;   RIGHT/LEFT HEART CATH AND CORONARY ANGIOGRAPHY N/A 11/29/2020   Procedure: RIGHT/LEFT HEART CATH AND CORONARY ANGIOGRAPHY;  Surgeon: Jolaine Artist, MD;  Location: [place] [facility];  Service: Cardiovascular;  Laterality: N/A;   TEE WITHOUT CARDIOVERSION N/A 12/04/2020   Procedure: TRANSESOPHAGEAL ECHOCARDIOGRAM (TEE);  Surgeon: Vickie Epley, MD;  Location: [place];  Service: Cardiovascular;  Laterality: N/A;   TEE WITHOUT CARDIOVERSION N/A 04/06/2021   Procedure: TRANSESOPHAGEAL ECHOCARDIOGRAM (TEE);  Surgeon: Gaye Pollack, MD;  Location: [place];  Service: Open Heart Surgery;  Laterality: N/A;   TONSILLECTOMY     warthin tumor removal Left 2014    Family History: Family History  Problem Relation Age of Onset   Depression Father    Arthritis Sister        RA   Heart failure Mother    Other Neg Hx    Colon cancer Neg Hx    Esophageal cancer Neg Hx    Rectal cancer Neg Hx    Stomach cancer Neg Hx    Colon polyps Neg Hx     Social History: Social History   Socioeconomic History   Marital status: Single    Spouse name: Not on file   Number of children: Not on file   Years of education: Not on file   Highest education level: Some college, no degree  Occupational History   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   Occupation: Retired  Tobacco Use   Smoking status: Former     Packs/day: 1.00    Years: 40.00    Total pack years: 40.00    Types: Cigarettes    Quit  date: 07/26/2019    Years since quitting: 2.7   Smokeless tobacco: Never   Tobacco comments:    1-1.5 pks per year x 40-45 yrs  Vaping Use   Vaping Use: Some days   Substances: CBD  Substance and Sexual Activity   Alcohol use: Yes    Alcohol/week: 7.0 standard drinks of alcohol    Types: 7 Shots of liquor per week    Comment: "2 vodka tonics a night"   Drug use: Yes    Comment: occasionally - CBD oil   Sexual activity: Not Currently  Other Topics Concern   Not on file  Social History Narrative   Lives in [place] alone      Social Determinants of Health   Financial Resource Strain: Low Risk  (09/10/2021)   Overall Financial Resource Strain (CARDIA)    Difficulty of Paying Living Expenses: Not very hard  Food Insecurity: No Food Insecurity (09/10/2021)   Hunger Vital Sign    Worried About Running Out of Food in the Last Year: Never true    Ran Out of Food in the Last Year: Never true  Transportation Needs: No Transportation Needs (09/10/2021)   PRAPARE - Hydrologist (Medical): No    Lack of Transportation (Non-Medical): No  Physical Activity: Sufficiently Active (09/10/2021)   Exercise Vital Sign    Days of Exercise per Week: 5 days    Minutes of Exercise per Session: 30 min  Recent Concern: Physical Activity - Insufficiently Active (07/19/2021)   Exercise Vital Sign    Days of Exercise per Week: 5 days    Minutes of Exercise per Session: 20 min  Stress: No Stress Concern Present (09/10/2021)   [place]    Feeling of Stress : Only a little  Social Connections: Socially Isolated (09/10/2021)   Social Connection and Isolation Panel [NHANES]    Frequency of Communication with Friends and Family: More than three times a week    Frequency of Social Gatherings with Friends and Family: Three times a  week    Attends Religious Services: Never    Active Member of Clubs or Organizations: No    Attends Archivist Meetings: Never    Marital Status: Never married    Allergies:  No Known Allergies  Objective:    Mean arterial Pressure 80-90  Physical Exam    General:  Uncomfortable HEENT: normal  Neck: supple. JVP elevated.  Carotids 2+ bilat; no bruits. No lymphadenopathy or thryomegaly appreciated. Cor: LVAD hum Lungs: + crackles Abdomen: obese soft, nontender, non-distended. No hepatosplenomegaly. No bruits or masses. Good bowel sounds. Driveline site clean. Anchor in place.  Extremities: no cyanosis, clubbing, rash. Warm no edema  Neuro: alert & oriented x 3. No focal deficits. Moves all 4 without problem     Telemetry   Sinus tach 130s  Personally reviewed   EKG   Sinus tach with anterior ST elevation initially   Labs    Basic Metabolic Panel: No results for input(s): "NA", "K", "CL", "CO2", "GLUCOSE", "BUN", "CREATININE", "CALCIUM", "MG", "PHOS" in the last 168 hours.  Liver Function Tests: No results for input(s): "AST", "ALT", "ALKPHOS", "BILITOT", "PROT", "ALBUMIN" in the last 168 hours. No results for input(s): "LIPASE", "AMYLASE" in the last 168 hours. No results for input(s): "AMMONIA" in the last 168 hours.  CBC: No results for input(s): "WBC", "NEUTROABS", "HGB", "HCT", "MCV", "PLT" in the last 168 hours.  Cardiac Enzymes:  No results for input(s): "CKTOTAL", "CKMB", "CKMBINDEX", "TROPONINI" in the last 168 hours.  BNP: BNP (last 3 results) Recent Labs    04/26/21 0420 05/04/21 0410 05/08/21 0345  BNP 273.9* 156.7* 349.4*    ProBNP (last 3 results) No results for input(s): "PROBNP" in the last 8760 hours.   CBG: No results for input(s): "GLUCAP" in the last 168 hours.  Coagulation Studies: No results for input(s): "LABPROT", "INR" in the last 72 hours.  Other results: EKG: Ventricular fibrillation.   Imaging    No  results found.  Assessment/Plan:    1. Anterior STEMI - will take to cath lab for emergent angiography - ASA given - continue NTG - no b-blocker with borderline hypotension/ pulmonary edema  2. Acute on Chronic systolic HFr EF due to iCM in setting of STEMI - Echo 05/19/20 EF 20-25% RV mildly HK. moderate AS  Mean gradient 13 AVA 1.2 cm2 DI 0.30 - s/p HM-III VAD + bioprosthetic AVR 04/06/21 - Baseline NYHA I. Volume status ok  - Volume status elevated -> IV lasix - Hold Losartan 12.5 mg daily - Hold eplerenone 12.5 mg daily   3. HM-3 LVAD implant - VAD interrogated personally. See HPI    4. Severe low-flow aortic stenosis s/p AVR - s/p VAD/bioprosthetic AVR on 11/25   5. CAD  - s/p anterior STEMI (12/20). LHC showed chronically occluded RCA (with L>>R collaterals) and thrombotic occlusion of proximal LAD. Underwent PCI of LAD. - Now s/p VAD - see #1   6. Paroxysmal Atrial Fibrillation w/ RVR - back in NSR after Dc-CV of VF - Off amio     7. 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  - Bcx recollected and pending  - Tissue sent to [facility] in [place] for broad 16 S ribosomal sequencing for bacterial pathogens as well as T wet Bhilai Bartonella Brucella  - Ceftriaxone 2 g IV daily x 6 weeks. Completed May 22, 2021. PICC is out - ICD removed - No infectious sx. WBC ok    8. Ankylosing spondylitis - His local rheumatologist is hesitant to start remicade with VAD/infection risk - D/w [name] VAD program they have recommended f/u with Veva Holes, MD for 2nd opinion if needed.  - Currently feeling ok without it - PCP managing pain meds   CRITICAL CARE Performed by: Glori Bickers  Total critical care time: 60 minutes  Critical care time was exclusive of separately billable procedures and treating other patients.  Critical care was necessary to treat or prevent imminent or  life-threatening deterioration.  Critical care was time spent personally by me (independent of midlevel providers or residents) on the following activities: development of treatment plan with patient and/or surrogate as well as nursing, discussions with consultants, evaluation of patient's response to treatment, examination of patient, obtaining history from patient or surrogate, ordering and performing treatments and interventions, ordering and review of laboratory studies, ordering and review of radiographic studies, pulse oximetry and re-evaluation of patient's condition.   Glori Bickers, MD 04/22/2022, 8:17 PM  VAD Team Pager [phone] (7am - 7am) +++VAD ISSUES ONLY+++   Advanced Heart Failure Team Pager [phone] (M-F; 7a - 5p)  Please contact [facility] for night-coverage after hours (5p -7a ) and weekends on [email or web address] for all non- LVAD Issues

## 2022-05-06 NOTE — ED Notes (Signed)
Patient taken to cath lab with Rapid response team and cath lab team at this time. Patient stable and in NAD at time of transport.

## 2022-05-06 NOTE — Progress Notes (Signed)
VAD Coordinator Procedure Note:   VAD Coordinator met patient in cath lab. Pt undergoing LHC per Dr. [name]. Hemodynamics and VAD parameters monitored by myself and Dr [name] throughout the procedure. Blood pressures were obtained with automatic cuff on left arm.    Time: Doppler Auto  BP Flow PI Power Speed  Pre-procedure:  2000  100/72 (82) 5.3 2.7 4.9 5800                    Sedation Induction: 2013  93/82 (87) 5.4 3 4.9 5800   2020  98/81 (88) 5.3 3 4.8 5800   2030  104/87 (91) 5.3 3.2 4.8 5800   2045  102/92 (97) 5.4 2.8 4.9 5800  Recovery Area: 2235  107/91 (98) 5.4 3 4.8 5800             Patient tolerated the procedure well. VAD Coordinator accompanied and remained with patient in recovery area. Pt received 25 mcg Fentanyl.    Patient Disposition: Pt was transported to CT with bedside nurse [facility] and then transported to 2h and handoff given to bedside nurse.  Tanda Rockers RN, BSN VAD Coordinator 24/7 Pager [phone]

## 2022-05-06 NOTE — ED Provider Notes (Signed)
Emergency Department Provider Note   I have reviewed the triage vital signs and the nursing notes.   HISTORY  Chief Complaint Chest Pain   HPI Brandon [name] is a 66 y.o. male ***   {**SYMPTOM/COMPLAINT  LOCATION (describe anatomically) DURATION (when did it start) TIMING (onset and pattern) SEVERITY (0-10, mild/moderate/severe) QUALITY (description of symptoms) CONTEXT (recent surgery, new meds, activity, etc.) MODIFYINGFACTORS (what makes it better/worse) ASSOCIATEDSYMPTOMS (pertinent positives and negatives)**}  Past Medical History:  Diagnosis Date   AICD (automatic cardioverter/defibrillator) present 08/31/2019   Ankylosing spondylitis ([place])    Arthritis    BENIGN PROSTATIC HYPERTROPHY 06/07/2008   CHF (congestive heart failure) ([place])    COLONIC POLYPS, HX OF 06/07/2008   Coronary artery disease    Depression    H/O hiatal hernia    Heart failure ([place])    HYPERLIPIDEMIA 06/07/2008   HYPERTENSION 06/07/2008   Myocardial infarction ([place]) 2005   NSTEMI, s/p LAD stent   NEPHROLITHIASIS, HX OF 06/07/2008   STEMI (ST elevation myocardial infarction) ([place]) 04/27/2019    Review of Systems {** Revise as appropriate then delete this line - Documentation of 10 systems OR 2 systems and "10-point ROS otherwise negative" is required **}Constitutional: No fever/chills Eyes: No visual changes. ENT: No sore throat. Cardiovascular: Denies chest pain. Respiratory: Denies shortness of breath. Gastrointestinal: No abdominal pain.  No nausea, no vomiting.  No diarrhea.  No constipation. Genitourinary: Negative for dysuria. Musculoskeletal: Negative for back pain. Skin: Negative for rash. Neurological: Negative for headaches, focal weakness or numbness. {**Psychiatric:  Endocrine:  Hematological/Lymphatic:  Allergic/Immunilogical: **}  ____________________________________________   PHYSICAL EXAM:  VITAL SIGNS: ED Triage Vitals  Enc Vitals Group     BP       Pulse      Resp      Temp      Temp src      SpO2      Weight      Height      Head Circumference      Peak Flow      Pain Score      Pain Loc      Pain Edu?      Excl. in GC?    {** Revise as appropriate then delete this line - 8 systems required **} Constitutional: Alert and oriented. Well appearing and in no acute distress. Eyes: Conjunctivae are normal. PERRL. EOMI. Head: Atraumatic. {**Ears:  Healthy appearing ear canals and TMs bilaterally **}Nose: No congestion/rhinnorhea. Mouth/Throat: Mucous membranes are moist.  Oropharynx non-erythematous. Neck: No stridor.  No meningeal signs.  {**No cervical spine tenderness to palpation.**} Cardiovascular: Normal rate, regular rhythm. Good peripheral circulation. Grossly normal heart sounds.   Respiratory: Normal respiratory effort.  No retractions. Lungs CTAB. Gastrointestinal: Soft and nontender. No distention.  {**Genitourinary:  **}Musculoskeletal: No lower extremity tenderness nor edema. No gross deformities of extremities. Neurologic:  Normal speech and language. No gross focal neurologic deficits are appreciated.  Skin:  Skin is warm, dry and intact. No rash noted. {**Psychiatric: Mood and affect are normal. Speech and behavior are normal.**}  ____________________________________________   LABS (all labs ordered are listed, but only abnormal results are displayed)  Labs Reviewed  COMPREHENSIVE METABOLIC PANEL  CBC WITH DIFFERENTIAL/PLATELET  TROPONIN I (HIGH SENSITIVITY)   ____________________________________________  EKG  *** ____________________________________________  RADIOLOGY  No results found.  ____________________________________________   PROCEDURES  Procedure(s) performed:   Procedures   ____________________________________________   INITIAL IMPRESSION / ASSESSMENT AND PLAN / ED  COURSE  Pertinent labs & imaging results that were available during my care of the patient were reviewed by  me and considered in my medical decision making (see chart for details).   This patient is Presenting for Evaluation of ***, which {Range:23949} require a range of treatment options, and {MDMcomplaint:23950} a complaint that involves a {MDMlevelrisk:23951} risk of morbidity and mortality.  The Differential Diagnoses include***.  Critical Interventions-    Medications  nitroGLYCERIN (NITROSTAT) SL tablet 0.4 mg (has no administration in time range)    Reassessment after intervention:     I *** Additional Historical Information from ***, as the patient is ***.  I decided to review pertinent External Data, and in summary ***.   Clinical Laboratory Tests Ordered, included   Radiologic Tests Ordered, included ***. I independently interpreted the images and agree with radiology interpretation.   Cardiac Monitor Tracing which shows ***   Social Determinants of Health Risk ***  Consult complete with  Medical Decision Making: Summary: ***  Reevaluation with update and discussion with   ***Considered admission***  Patient's presentation is most consistent with {EM COPA:27473}   Disposition:   ____________________________________________  FINAL CLINICAL IMPRESSION(S) / ED DIAGNOSES  Final diagnoses:  None     NEW OUTPATIENT MEDICATIONS STARTED DURING THIS VISIT:  New Prescriptions   No medications on file    Note:  This document was prepared using Dragon voice recognition software and may include unintentional dictation errors.  Nanda Quinton, MD, [facility] Emergency Medicine

## 2022-05-07 ENCOUNTER — Inpatient Hospital Stay (HOSPITAL_COMMUNITY): Payer: Medicare Other

## 2022-05-07 ENCOUNTER — Encounter (HOSPITAL_COMMUNITY): Payer: Self-pay | Admitting: Internal Medicine

## 2022-05-07 ENCOUNTER — Telehealth: Payer: Self-pay

## 2022-05-07 DIAGNOSIS — Z95811 Presence of heart assist device: Secondary | ICD-10-CM | POA: Diagnosis not present

## 2022-05-07 DIAGNOSIS — I350 Nonrheumatic aortic (valve) stenosis: Secondary | ICD-10-CM

## 2022-05-07 DIAGNOSIS — I2109 ST elevation (STEMI) myocardial infarction involving other coronary artery of anterior wall: Secondary | ICD-10-CM | POA: Diagnosis not present

## 2022-05-07 DIAGNOSIS — I340 Nonrheumatic mitral (valve) insufficiency: Secondary | ICD-10-CM

## 2022-05-07 LAB — BASIC METABOLIC PANEL
Anion gap: 9 (ref 5–15)
BUN: 18 mg/dL (ref 8–23)
CO2: 25 mmol/L (ref 22–32)
Calcium: 8.6 mg/dL — ABNORMAL LOW (ref 8.9–10.3)
Chloride: 103 mmol/L (ref 98–111)
Creatinine, Ser: 0.97 mg/dL (ref 0.61–1.24)
GFR, Estimated: >60 mL/min (ref >60)
Glucose, Bld: 102 mg/dL — ABNORMAL HIGH (ref 70–99)
Potassium: 3.5 mmol/L (ref 3.5–5.1)
Sodium: 137 mmol/L (ref 135–145)

## 2022-05-07 LAB — CBC
HCT: 41.4 % (ref 39.0–52.0)
Hemoglobin: 13.5 g/dL (ref 13.0–17.0)
MCH: 26.4 pg (ref 26.0–34.0)
MCHC: 32.6 g/dL (ref 30.0–36.0)
MCV: 80.9 fL (ref 80.0–100.0)
Platelets: 184 10*3/uL (ref 150–400)
RBC: 5.12 MIL/uL (ref 4.22–5.81)
RDW: 16.3 % — ABNORMAL HIGH (ref 11.5–15.5)
WBC: 9.3 10*3/uL (ref 4.0–10.5)
nRBC: 0 % (ref 0.0–0.2)

## 2022-05-07 LAB — TROPONIN I (HIGH SENSITIVITY)
Troponin I (High Sensitivity): 8627 ng/L (ref <18)
Troponin I (High Sensitivity): >24000 ng/L (ref <18)

## 2022-05-07 LAB — MRSA NEXT GEN BY PCR, NASAL: MRSA by PCR Next Gen: NOT DETECTED

## 2022-05-07 LAB — COOXEMETRY PANEL
Carboxyhemoglobin: 2 % — ABNORMAL HIGH (ref 0.5–1.5)
Methemoglobin: 0.7 % (ref 0.0–1.5)
O2 Saturation: 71.7 %
Total hemoglobin: 13.9 g/dL (ref 12.0–16.0)

## 2022-05-07 LAB — HEPARIN LEVEL (UNFRACTIONATED): Heparin Unfractionated: <0.1 IU/mL — ABNORMAL LOW (ref 0.30–0.70)

## 2022-05-07 LAB — LACTATE DEHYDROGENASE: LDH: 273 U/L — ABNORMAL HIGH (ref 98–192)

## 2022-05-07 LAB — PROTIME-INR
INR: 1.8 — ABNORMAL HIGH (ref 0.8–1.2)
Prothrombin Time: 21 seconds — ABNORMAL HIGH (ref 11.4–15.2)

## 2022-05-07 MED ORDER — MIDAZOLAM HCL 2 MG/2ML IJ SOLN: 2.0000 mg | Freq: Once | INTRAMUSCULAR | Status: AC

## 2022-05-07 MED ORDER — TORSEMIDE 20 MG PO TABS
20.0000 mg | ORAL_TABLET | Freq: Every day | ORAL | Status: DC
  Administered 2022-05-07 – 2022-05-09 (×3): 20 mg via ORAL
  Filled 2022-05-07 (×3): qty 1

## 2022-05-07 MED ORDER — FENTANYL CITRATE PF 50 MCG/ML IJ SOSY
PREFILLED_SYRINGE | INTRAMUSCULAR | Status: AC
  Administered 2022-05-07: 100 ug via INTRAVENOUS
  Filled 2022-05-07: qty 1

## 2022-05-07 MED ORDER — DM-GUAIFENESIN ER 30-600 MG PO TB12
1.0000 | ORAL_TABLET | Freq: Two times a day (BID) | ORAL | Status: DC | PRN
  Administered 2022-05-07: 1 via ORAL
  Filled 2022-05-07: qty 1

## 2022-05-07 MED ORDER — KETAMINE HCL 50 MG/5ML IJ SOSY
PREFILLED_SYRINGE | INTRAMUSCULAR | Status: AC
  Administered 2022-05-07: 90 mg via INTRAVENOUS
  Filled 2022-05-07: qty 5

## 2022-05-07 MED ORDER — KETAMINE HCL 50 MG/5ML IJ SOSY
1.0000 mg/kg | PREFILLED_SYRINGE | Freq: Once | INTRAMUSCULAR | Status: AC
  Filled 2022-05-07: qty 10

## 2022-05-07 MED ORDER — HEPARIN (PORCINE) 25000 UT/250ML-% IV SOLN
1200.0000 [IU]/h | INTRAVENOUS | Status: DC
  Administered 2022-05-07: 700 [IU]/h via INTRAVENOUS
  Administered 2022-05-08: 1100 [IU]/h via INTRAVENOUS
  Administered 2022-05-09: 1150 [IU]/h via INTRAVENOUS
  Administered 2022-05-11: 1200 [IU]/h via INTRAVENOUS
  Filled 2022-05-07 (×7): qty 250

## 2022-05-07 MED ORDER — CHLORHEXIDINE GLUCONATE CLOTH 2 % EX PADS
6.0000 | MEDICATED_PAD | Freq: Every day | CUTANEOUS | Status: DC
  Administered 2022-05-07: 6 via TOPICAL

## 2022-05-07 MED ORDER — WARFARIN SODIUM 5 MG PO TABS
5.0000 mg | ORAL_TABLET | Freq: Once | ORAL | Status: AC
  Administered 2022-05-07: 5 mg via ORAL
  Filled 2022-05-07: qty 1

## 2022-05-07 MED ORDER — FENTANYL CITRATE PF 50 MCG/ML IJ SOSY
PREFILLED_SYRINGE | INTRAMUSCULAR | Status: AC
  Filled 2022-05-07: qty 1

## 2022-05-07 MED ORDER — SODIUM CHLORIDE 0.45 % IV SOLN: INTRAVENOUS | Status: DC

## 2022-05-07 MED ORDER — POTASSIUM CHLORIDE CRYS ER 20 MEQ PO TBCR
40.0000 meq | EXTENDED_RELEASE_TABLET | Freq: Once | ORAL | Status: AC
  Administered 2022-05-07: 40 meq via ORAL
  Filled 2022-05-07: qty 2

## 2022-05-07 MED ORDER — KETAMINE HCL 50 MG/5ML IJ SOSY: 90.0000 mg | PREFILLED_SYRINGE | Freq: Once | INTRAMUSCULAR | Status: AC

## 2022-05-07 MED ORDER — MIDAZOLAM HCL 2 MG/2ML IJ SOLN
INTRAMUSCULAR | Status: AC
  Administered 2022-05-07: 2 mg via INTRAVENOUS
  Filled 2022-05-07: qty 2

## 2022-05-07 MED ORDER — FENTANYL CITRATE PF 50 MCG/ML IJ SOSY: 100.0000 ug | PREFILLED_SYRINGE | Freq: Once | INTRAMUSCULAR | Status: AC

## 2022-05-07 MED ORDER — WARFARIN - PHARMACIST DOSING INPATIENT: Freq: Every day | Status: DC

## 2022-05-07 NOTE — Discharge Summary (Incomplete)
Advanced Heart Failure Team  Discharge Summary   Patient ID: Brandon [name] MRN: [id number], DOB/AGE: [id number] y.o. Admit date: 05/06/2022 D/C date:     05/07/2022   Primary Discharge Diagnoses:  Anterior STEMI Acute on chronic systolic HFrEF due to iCM in setting of STEMI  Secondary Discharge Diagnoses:  HM 3 LVAD implant Severe low-flow aortic stenosis s/p AVR CAD VF Paroxsymal atrial fibrillation Possible "acute AoV endocarditis" Ankylosing spondylitis  Hospital Course:  Brandon [name] is a 66 y.o.male (originally from [place], [place]) with h/o CAD s/p previous MI, severe AS, chronic systolic HF s/p HM-3 VAD in 10/22.   Recently discharged 12/12 after VF requiring emergent DCCV..   Mr. Gasparyan presented to the ED with severe CP, nausea and SOB. Had anterior STEMI. HsTrop >24K. Underwent L/RHC which showed stable CAD, no culprit lesion seen on cath. Suspected thrombotic event from AoV. Underwent TEE which showed *** .   Pt will continue to be followed closely in the VAD/HF clinic. Dr [name] evaluated and deemed appropriate for discharge.    See below for detailed problem list:    LVAD Interrogation HM II:   Speed:     Flow:      PI:      Power:       Back-up speed:       Discharge Weight Range: *** Discharge Vitals: Blood pressure (!) 89/77, pulse (!) 108, temperature 99.5 F (37.5 C), resp. rate 16, height 6' (1.829 m), weight 91.1 kg, SpO2 94 %.  Labs: Lab Results  Component Value Date   WBC 9.3 05/07/2022   HGB 13.5 05/07/2022   HCT 41.4 05/07/2022   MCV 80.9 05/07/2022   PLT 184 05/07/2022    Recent Labs  Lab 05/06/22 1902 05/06/22 2023 05/07/22 0438  NA 131*   < > 137  K 4.9   < > 3.5  CL 102  --  103  CO2 21*  --  25  BUN 21  --  18  CREATININE 1.03  --  0.97  CALCIUM 8.8*  --  8.6*  PROT 7.0  --   --   BILITOT 1.7*  --   --   ALKPHOS 123  --   --   ALT 20  --   --   AST 35  --   --   GLUCOSE 124*  --  102*   < > = values in this interval not  displayed.   Lab Results  Component Value Date   CHOL 122 02/06/2021   HDL 37 (L) 02/06/2021   LDLCALC 60 02/06/2021   TRIG 124 02/06/2021   BNP (last 3 results) Recent Labs    05/08/21 0345  BNP 349.4*    ProBNP (last 3 results) No results for input(s): "PROBNP" in the last 8760 hours.   Diagnostic Studies/Procedures   CT ANGIO CHEST AORTA W/CM & OR WO/CM  Addendum Date: 05/06/2022   ADDENDUM REPORT: 05/06/2022 23:34 ADDENDUM: I discussed these findings with the attending cardiologist and have reviewed several of the intraoperative arteriograms. The reported blush underneath the origin of the left main coronary artery likely reflects contrast within the sinus of Valsalva adjacent to the scaffolding of the artificial aortic valve. There is, however, no dissection flap or extraluminal contrast identified in this region. Electronically Signed   By: Fidela Salisbury M.D.   On: 05/06/2022 23:34   Result Date: 05/06/2022 CLINICAL DATA:  Aortic root dissection EXAM: CT ANGIOGRAPHY CHEST WITH CONTRAST TECHNIQUE: Multidetector CT imaging  of the chest was performed using the standard protocol during bolus administration of intravenous contrast. Multiplanar CT image reconstructions and MIPs were obtained to evaluate the vascular anatomy. RADIATION DOSE REDUCTION: This exam was performed according to the departmental dose-optimization program which includes automated exposure control, adjustment of the mA and/or kV according to patient size and/or use of iterative reconstruction technique. CONTRAST:  86m OMNIPAQUE IOHEXOL 350 MG/ML SOLN COMPARISON:  11/21/2020, 04/30/2022 FINDINGS: Cardiovascular: Ventriassist left ventricular assist device is in place. There is thrombosis of the inflow and proximal outflow cannulas of the device, best seen on axial image # 132 and # 136, series 5. Opacification of the terminal outflow cannula may be retrograde in fashion from the ascending aorta. Global cardiac size  is within normal limits with left ventricular dilation and marked thinning of the left ventricular apex and septal walls again noted. Extensive multi-vessel coronary artery calcification. No pericardial effusion. Aortic valve replacement and tube graft repair of the ascending thoracic aorta has been performed. No thoracic aortic intramural hematoma, dissection, or aneurysm. Moderate atherosclerotic calcification within the descending thoracic aorta. Arch vasculature demonstrates variant anatomy with separate origin of the left vertebral artery directly from the aorta which appears diminutive in nature. Arch vasculature is otherwise unremarkable. Left internal jugular Swan-Ganz catheter tip noted within the origin of the right lower lobar pulmonary artery. Mediastinum/Nodes: Visualize thyroid is unremarkable. No pathologic thoracic adenopathy. Esophagus unremarkable. No mediastinal hematoma or pneumomediastinum. Lungs/Pleura: Mild emphysema. Multifocal airspace infiltrates are noted within the dependent right upper lobe and lower lobes bilaterally which may represent asymmetric alveolar pulmonary edema or superimposed acute pneumonic infiltrate. There is interlobular septal thickening in keeping with at least mild interstitial pulmonary edema, likely cardiogenic in nature. Small right and trace left pleural effusions are present. No pneumothorax. Upper Abdomen: Cholelithiasis without pericholecystic inflammatory change. No acute abnormality. Musculoskeletal: Right chest wall nerve stimulator device is seen with its single lead extending toward the right carotid body. No acute bone abnormality. No lytic or blastic bone lesion. Review of the MIP images confirms the above findings. IMPRESSION: 1. Status post aortic valve replacement and tube graft repair of the ascending aorta. No evidence of aortic intramural hematoma, dissection, or aneurysm. 2. Ventriassist left ventricular assist device in place. Suspected  thrombosis of the inflow and proximal outflow cannulas of the device. Opacification of the terminal outflow cannula may be retrograde in fashion from the ascending aorta. 3. Extensive multi-vessel coronary artery calcification. Marked thinning of the a left ventricular apex and septal wall in keeping with prior myocardial infarction. 4. Mild emphysema. 5. Multifocal airspace infiltrates within the dependent right upper lobe and lower lobes bilaterally which may represent asymmetric alveolar pulmonary edema or superimposed acute pneumonic infiltrate. 6. Mild interstitial pulmonary edema, likely cardiogenic in nature. Small right and trace left pleural effusions. 7. Cholelithiasis. 8. Swan-Ganz catheter tip within the right lower lobar pulmonary artery origin. Withdrawal of the catheter by 3-4 cm may more optimally position the device. Aortic Atherosclerosis (ICD10-I70.0) and Emphysema (ICD10-J43.9). Electronically Signed: By: AFidela SalisburyM.D. On: 05/06/2022 22:55   CARDIAC CATHETERIZATION  Result Date: 05/06/2022   Prox Cx lesion is 50% stenosed.   Prox RCA to Mid RCA lesion is 100% stenosed.   Ost RCA to Prox RCA lesion is 70% stenosed.   2nd Diag lesion is 90% stenosed.   Dist LAD lesion is 50% stenosed.   Non-stenotic Mid LAD lesion was previously treated. Findings: Ao = 93/83 (88) RA = 1 RV = 34/5  PA = 28/18 (22) PCW = 5 Fick cardiac output/index = 4.8/2.2 TD CO/CI = 6.0/2.8 PVR = 3.0 WU Ao sat = 92% PA sat = 60% Assessment: 1. Stable CAD with evidence of pre-existing aortic root dissection around AVR 2. Low filling pressures with normal cardiac output Plan/Discussion: Stat CTA chest to evaluate dissection. Discussed with Dr. Tenny Craw in [place]. Glori Bickers, MD 9:51 PM    DG Chest Portable 1 View  Result Date: 05/06/2022 CLINICAL DATA:  Chest pain, left ventricular assist device EXAM: PORTABLE CHEST 1 VIEW COMPARISON:  05/30/2021, 04/30/2022 FINDINGS: Two frontal views of the chest are obtained.  Postsurgical changes from median sternotomy and aortic valve replacement. Left ventricular assist device again noted. Portions of the left lung base are obscured by overlying cardiac leads and monitoring device. Nerve stimulator overlies the right chest, lead extending to the right submandibular region. Cardiac silhouette is enlarged. There is increased central vascular congestion with developing bibasilar interstitial and ground-glass opacities most consistent with edema, though viral pneumonia could give a similar appearance. No effusion or pneumothorax. IMPRESSION: 1. Bibasilar interstitial prominence and ground-glass opacities most consistent with developing pulmonary edema, though viral pneumonia could give a similar pattern of findings. 2. Left ventricular assist device. Electronically Signed   By: Randa Ngo M.D.   On: 05/06/2022 19:28    Discharge Medications   Allergies as of 05/07/2022   No Known Allergies   Med Rec must be completed prior to using this [facility]***       Disposition   The patient will be discharged in stable condition to home. Discharge Instructions     AMB referral to Phase II Cardiac Rehabilitation   Complete by: As directed    Diagnosis: STEMI   After initial evaluation and assessments completed: Virtual Based Care may be provided alone or in conjunction with Phase 2 Cardiac Rehab based on patient barriers.: Yes   Intensive Cardiac Rehabilitation (ICR) [place] location only OR Traditional Cardiac Rehabilitation (TCR) *If criteria for ICR are not met will enroll in TCR [facility] only): Yes          Duration of Discharge Encounter: Greater than 35 minutes   Signed, *** 05/07/2022, 2:41 PM

## 2022-05-07 NOTE — Progress Notes (Addendum)
ANTICOAGULATION CONSULT NOTE - Initial Consult  Pharmacy Consult for heparin > warfarin Indication: LVAD HM3  No Known Allergies  Patient Measurements: Height: 6' (182.9 cm) Weight: 91.1 kg (200 lb 13.4 oz) IBW/kg (Calculated) : 77.6   Vital Signs: Temp: 99.5 F (37.5 C) (12/26 1330) Temp Source: Core (12/26 1200) BP: 89/77 (12/26 1215) Pulse Rate: 108 (12/26 1330)  Labs: Recent Labs    05/06/22 1902 05/06/22 2023 05/06/22 2106 05/06/22 2107 05/06/22 2305 05/07/22 0438  HGB 13.8   < > 12.2* 15.3  --  13.5  HCT 43.8   < > 36.0* 45.0  --  41.4  PLT 186  --   --   --   --  184  LABPROT 23.7*  --   --   --   --  21.0*  INR 2.1*  --   --   --   --  1.8*  CREATININE 1.03  --   --   --   --  0.97  TROPONINIHS 48*  --   --   --  8,627* >24,000*   < > = values in this interval not displayed.    Estimated Creatinine Clearance: 80 mL/min (by C-G formula based on SCr of 0.97 mg/dL).   Medical History: Past Medical History:  Diagnosis Date   AICD (automatic cardioverter/defibrillator) present 08/31/2019   Ankylosing spondylitis ([place])    Arthritis    BENIGN PROSTATIC HYPERTROPHY 06/07/2008   CHF (congestive heart failure) ([place])    COLONIC POLYPS, HX OF 06/07/2008   Coronary artery disease    Depression    H/O hiatal hernia    Heart failure ([place])    HYPERLIPIDEMIA 06/07/2008   HYPERTENSION 06/07/2008   Myocardial infarction [facility]) 2005   NSTEMI, s/p LAD stent   NEPHROLITHIASIS, HX OF 06/07/2008   STEMI (ST elevation myocardial infarction) ([place]) 04/27/2019     Assessment: 66yom with Hx endocarditis, bAVR and LVAD 11/22 admitted with CP > cath no acute infarct  INR 1.8  Will begin heparin  drip and follow up plan after TEE No vegetation seen on TEE continue heparin drip - resume warfarin  PTA warfarin 5mg  TTSa.2.5mg  MWFSu  Goal of Therapy:  INR:2-2.5 Heparin level 0.3  Monitor platelets by anticoagulation protocol: Yes   Plan:  No bolus  Heparin drip 700  uts/hr  Heparin level at 6pm Warfarin 5mg  x1  Daily heparin level cbc protime     Bonnita Nasuti Pharm.D. CPP, BCPS Clinical Pharmacist [phone] 05/07/2022 2:23 PM

## 2022-05-07 NOTE — TOC Initial Note (Signed)
Transition of Care [facility]) - Initial/Assessment Note    Patient Details  Name: Brandon [name] MRN: [id number] Date of Birth: 21-Jul-1955  Transition of Care [facility]) CM/SW Contact:    Erenest Rasher, RN Phone Number: [phone] 05/07/2022, 3:50 PM  Clinical Narrative:                  HF TOC CM spoke to pt. Has needed DME at home. Had [employer] in the past for [facility]. Has scale at home for daily weights. Will continue to follow for dc needs.      Expected Discharge Plan: Home/Self Care Barriers to Discharge: Continued Medical Work up   Patient Goals and CMS Choice Patient states their goals for this hospitalization and ongoing recovery are:: wants to get better CMS Medicare.gov Compare Post Acute Care list provided to:: Patient        Expected Discharge Plan and Services   Discharge Planning Services: CM Consult   Living arrangements for the past 2 months: Single Family Home                                      Prior Living Arrangements/Services Living arrangements for the past 2 months: Single Family Home Lives with:: Self Patient language and need for interpreter reviewed:: Yes Do you feel safe going back to the place where you live?: Yes        Care giver support system in place?: Yes (comment) Current home services: DME (rolling walker, bedside commode, oxygen ([employer])) Criminal Activity/Legal Involvement Pertinent to Current Situation/Hospitalization: No - Comment as needed  Activities of Daily Living      Permission Sought/Granted Permission sought to share information with : Case Manager, Family Supports, PCP Permission granted to share information with : Yes, Verbal Permission Granted  Share Information with NAME: Debria Garret     Permission granted to share info w Relationship: SO  Permission granted to share info w Contact Information: [phone]  Emotional Assessment Appearance:: Appears stated age Attitude/Demeanor/Rapport:  Engaged Affect (typically observed): Accepting Orientation: : Oriented to Self, Oriented to Place, Oriented to  Time, Oriented to Situation   Psych Involvement: No (comment)  Admission diagnosis:  Dissection of aorta ([place]) [I71.00] Patient Active Problem List   Diagnosis Date Noted   Dissection of aorta ([place]) 05/06/2022   Acute ST elevation myocardial infarction (STEMI) due to occlusion of left anterior descending (LAD) coronary artery ([place]) 05/06/2022   VF (ventricular fibrillation) ([place]) 04/22/2022   Long term (current) use of anticoagulants 05/15/2021   LVAD (left ventricular assist device) present ([place]) 04/06/2021   Protein-calorie malnutrition, severe 03/14/2021   ILD (interstitial lung disease) ([place])    Ankylosing spondylitis (HCC)    Chronic systolic CHF (congestive heart failure) ([place]) 03/07/2021   Acute on chronic systolic (congestive) heart failure ([place]) 01/08/2021   Dental caries    Malnutrition of moderate degree 12/01/2020   Pressure injury of skin 12/01/2020   Cholelithiases    Aortic valve endocarditis    Elevated brain natriuretic peptide (BNP) level 11/30/2020   Elevated troponin 11/30/2020   Hypokalemia 11/30/2020   Hypomagnesemia 11/30/2020   Normocytic anemia 11/30/2020   Dyspnea    Infected defibrillator ([place])    Bacteremia    Central line infection    Acute respiratory failure with hypoxia and hypercapnia ([place]) 11/29/2020   Centrilobular emphysema ([place]) 08/09/2020   Benign neoplasm of colon  Heart failure ([place]) 12/31/2019   Ischemic cardiomyopathy 08/31/2019   STEMI (ST elevation myocardial infarction) ([place]) 04/27/2019   Acute anterior wall MI ([place]) 04/27/2019   Mild aortic stenosis 12/02/2012   Ankylosis of sacroiliac joint 03/08/2011   TOBACCO ABUSE 03/13/2009   Hyperlipidemia with target LDL less than 70 06/07/2008   Essential hypertension 06/07/2008   CAD (coronary artery disease) 06/07/2008   BENIGN PROSTATIC HYPERTROPHY 06/07/2008   History  of colon polyps 06/07/2008   NEPHROLITHIASIS, HX OF 06/07/2008   PCP:  [name], MD Pharmacy:   [id number] [place], [place] [address] [place] [address] Phone: [phone] Fax: [phone]  [id number] - [place], [place] [address] [place]  [address] Phone: [phone] Fax: [phone]     Social Determinants of Health (SDOH) Social History: SDOH Screenings   Food Insecurity: No Food Insecurity (09/10/2021)  Housing: Low Risk  (09/10/2021)  Transportation Needs: No Transportation Needs (09/10/2021)  Alcohol Screen: Low Risk  (09/10/2021)  Depression (PHQ2-9): Low Risk  (02/22/2022)  Financial Resource Strain: Low Risk  (09/10/2021)  Physical Activity: Sufficiently Active (09/10/2021)  Recent Concern: Physical Activity - Insufficiently Active (07/19/2021)  Social Connections: Socially Isolated (09/10/2021)  Stress: No Stress Concern Present (09/10/2021)  Tobacco Use: Medium Risk (05/07/2022)   SDOH Interventions:     Readmission Risk Interventions     No data to display

## 2022-05-07 NOTE — Progress Notes (Signed)
Pre- conscious sedation assessment ASA 4 Mallampati 2  Planning for  Ketamine, versed, fentanyl. Preoxygenating on 6L [place].  Julian Hy, DO 05/07/22 3:13 PM [facility]

## 2022-05-07 NOTE — CV Procedure (Signed)
    TRANSESOPHAGEAL ECHOCARDIOGRAM   NAME:  Brandon [name]   MRN: [id number] DOB:  1956-02-04   ADMIT DATE: 05/06/2022  INDICATIONS:  Abnormal aortic valve  PROCEDURE:   Informed consent was obtained prior to the procedure. The risks, benefits and alternatives for the procedure were discussed and the patient comprehended these risks.  Risks include, but are not limited to, cough, sore throat, vomiting, nausea, somnolence, esophageal and stomach trauma or perforation, bleeding, low blood pressure, aspiration, pneumonia, infection, trauma to the teeth and death.    After a procedural time-out, the patient was sedated by Dr. Carlis Abbott in [place]. The transesophageal probe was inserted in the esophagus and stomach without difficulty and multiple views were obtained.    COMPLICATIONS:    There were no immediate complications.  FINDINGS:  LEFT VENTRICLE: EF = 20%. Dilated. Global HK. VAD cannula well positioned in LV apex.   RIGHT VENTRICLE: Normal size and function.   LEFT ATRIUM: Moderately dilated  LEFT ATRIAL APPENDAGE: No thrombus.   RIGHT ATRIUM: Normal  AORTIC VALVE:  Bioprosthetic AVR. Grossly normal. Minimal opening. No AI. No vegetation.   AORTIC ROOT: Normal No dissection  MITRAL VALVE:    Normal. Trivial MR  TRICUSPID VALVE: Normal.Trivial TR  PULMONIC VALVE: Grossly normal.  INTERATRIAL SEPTUM: No PFO or ASD.  PERICARDIUM: No effusion  DESCENDING AORTA: Not visualized   Benay Spice 3:35 PM

## 2022-05-07 NOTE — Progress Notes (Signed)
ANTICOAGULATION CONSULT NOTE   Pharmacy Consult for heparin > warfarin Indication: LVAD HM3  No Known Allergies  Patient Measurements: Height: 6' (182.9 cm) Weight: 91.1 kg (200 lb 13.4 oz) IBW/kg (Calculated) : 77.6   Vital Signs: Temp: 99.1 F (37.3 C) (12/26 2045) Temp Source: Core (12/26 2000) BP: 89/77 (12/26 1215) Pulse Rate: 107 (12/26 2045)  Labs: Recent Labs    05/06/22 1902 05/06/22 2023 05/06/22 2106 05/06/22 2107 05/06/22 2305 05/07/22 0438 05/07/22 2028  HGB 13.8   < > 12.2* 15.3  --  13.5  --   HCT 43.8   < > 36.0* 45.0  --  41.4  --   PLT 186  --   --   --   --  184  --   LABPROT 23.7*  --   --   --   --  21.0*  --   INR 2.1*  --   --   --   --  1.8*  --   HEPARINUNFRC  --   --   --   --   --   --  <0.10*  CREATININE 1.03  --   --   --   --  0.97  --   TROPONINIHS 48*  --   --   --  8,627* >24,000*  --    < > = values in this interval not displayed.     Estimated Creatinine Clearance: 80 mL/min (by C-G formula based on SCr of 0.97 mg/dL).   Medical History: Past Medical History:  Diagnosis Date   AICD (automatic cardioverter/defibrillator) present 08/31/2019   Ankylosing spondylitis ([place])    Arthritis    BENIGN PROSTATIC HYPERTROPHY 06/07/2008   CHF (congestive heart failure) ([place])    COLONIC POLYPS, HX OF 06/07/2008   Coronary artery disease    Depression    H/O hiatal hernia    Heart failure ([place])    HYPERLIPIDEMIA 06/07/2008   HYPERTENSION 06/07/2008   Myocardial infarction ([place]) 2005   NSTEMI, s/p LAD stent   NEPHROLITHIASIS, HX OF 06/07/2008   STEMI (ST elevation myocardial infarction) ([place]) 04/27/2019     Assessment: 66yom with Hx endocarditis, bAVR and LVAD 11/22 admitted with CP > cath no acute infarct. Pharmacy dosing heparin and warfarin with INR 1.8 -heparin level < 0.1   PTA warfarin 5mg  TTSa.2.5mg  MWFSu  Goal of Therapy:  INR:2-2.5 Heparin level 0.3  Monitor platelets by anticoagulation protocol: Yes   Plan:   -Increase heparin to 800 units/hr -Daily heparin level and CBC  Hildred Laser, PharmD Clinical Pharmacist **Pharmacist phone directory can now be found on [email or web address] ([email or web address]).  Listed under [place].

## 2022-05-07 NOTE — Progress Notes (Addendum)
LVAD Coordinator Rounding Note:  Admitted overnight due to complaints of crushing chest pain, vomiting and SOB. Pt found to have ST elevation and was taken directly to the cath lab for [facility].  R/LHC 12/25: - R/LHC - stable CAD, low filling pressures, normal CO. ?evidence of pre-existing aortic root dissection around AVR   Pt lying in bed on my arrival. States he has not had any recurrent chest pain but does have shortness of breath at rest. Plan for TEE this afternoon per Dr.Bensimhon.    HM3 LVAD implanted on 04/06/21 by Dr. Cyndia Bent under DT criteria.  Vital signs: HR: 98.1 Doppler Pressure:84 Automatic BP: 93/82 (88) O2 Sat: 95% 3L [place] Wt:200.8    LVAD interrogation reveals:  Speed:5800 Flow: 5.2 Power:  4.8 PI:3.8  Alarms: none Events:  >120 Hematocrit: 41 Fixed speed: 5800 Low speed limit: 5500  Drive Line: Existing VAD dressing removed and site care performed using sterile technique. Drive line exit site cleaned with Chlora prep applicators x 2, allowed to dry, and S[place]aview dressing with Silverlon patch applied. Exit site healed and incorporated, the velour is fully implanted at exit site. No redness, tenderness, drainage, foul odor or rash noted. Drive line anchor re-applied. Pt denies fever or chills. Next dressing change due 05/14/21 by VAD Coordinator, caregiver or bedside nurse.   Labs:  LDH trend:273  INR trend: 1.8  Troponin: 48>8,627>24,000  Anticoagulation Plan: -INR Goal: 2.0-2.5 -ASA Dose: n/a  Blood Products:   Device:N/A  Arrythmias: none this admission  -DCCV 04/22/22 VFIB   Plan/Recommendations:  1. Page VAD Coordinator with any equipment of VAD concerns 2. Plan for TEE today at the bedside.  Bobbye Morton RN, BSN VAD Coordinator 24/7 Pager [phone]

## 2022-05-07 NOTE — Progress Notes (Addendum)
Advanced Heart Failure VAD Team Note  PCP-Cardiologist: [name], MD   Subjective:    12/25 Presented with CP and anterior ST elevation  R/LHC 12/25 -R/LHC with stable CAD, low filling pressures with normal CO. ? Dissection flap around AVR CTA chest 12/25: No evidence of dissection flap  HS troponin > 24K  SWAN #s PA 26/20 CVP 7-8 CO 6.4 CI 3 No CO-OX > check   No recurrent CP or nausea. Occasional dyspnea but much improved from yesterday.   LVAD INTERROGATION:  HeartMate III LVAD:   Flow 5.3 liters/min, speed 5800, power 5, PI 5.4.  > 100 PI events.  Objective:    Vital Signs:   Temp:  [97.3 F (36.3 C)-98.4 F (36.9 C)] 97.3 F (36.3 C) (12/26 0500) Pulse Rate:  [52-131] 82 (12/26 0500) Resp:  [13-31] 16 (12/26 0500) BP: (81-111)/(50-95) 88/79 (12/26 0400) SpO2:  [91 %-96 %] 95 % (12/26 0500) Arterial Line BP: (71-119)/(56-94) 92/78 (12/26 0500) Weight:  [91.1 kg] 91.1 kg (12/26 0500) Last BM Date :  (PTA) Mean arterial Pressure 80s  Intake/Output:   Intake/Output Summary (Last 24 hours) at 05/07/2022 0813 Last data filed at 05/07/2022 0500 Gross per 24 hour  Intake 61.16 ml  Output 1450 ml  Net -1388.84 ml     Physical Exam    General:  Well appearing. Sitting up in bed HEENT: normal Neck: supple. JVP 7-8. Carotids 2+ bilat; no bruits.  Cor: Mechanical heart sounds with LVAD hum present. Lungs: clear Abdomen: soft, nontender, nondistended. Driveline: C/D/I; securement device intact and driveline incorporated Extremities: no cyanosis, clubbing, rash, edema Neuro: alert & orientedx3, cranial nerves grossly intact. moves all 4 extremities w/o difficulty. Affect pleasant   Telemetry   SR 70s-80s (personally reviewed)  Labs   Basic Metabolic Panel: Recent Labs  Lab 05/06/22 1902 05/06/22 2023 05/06/22 2106 05/06/22 2107 05/07/22 0438  NA 131* 137 146* 134* 137  K 4.9 3.7 2.8* 3.8 3.5  CL 102  --   --   --  103  CO2 21*  --    --   --  25  GLUCOSE 124*  --   --   --  102*  BUN 21  --   --   --  18  CREATININE 1.03  --   --   --  0.97  CALCIUM 8.8*  --   --   --  8.6*    Liver Function Tests: Recent Labs  Lab 05/06/22 1902  AST 35  ALT 20  ALKPHOS 123  BILITOT 1.7*  PROT 7.0  ALBUMIN 3.6   No results for input(s): "LIPASE", "AMYLASE" in the last 168 hours. No results for input(s): "AMMONIA" in the last 168 hours.  CBC: Recent Labs  Lab 05/06/22 1902 05/06/22 2023 05/06/22 2106 05/06/22 2107 05/07/22 0438  WBC 15.1*  --   --   --  9.3  NEUTROABS 13.3*  --   --   --   --   HGB 13.8 15.3 12.2* 15.3 13.5  HCT 43.8 45.0 36.0* 45.0 41.4  MCV 82.8  --   --   --  80.9  PLT 186  --   --   --  184    INR: Recent Labs  Lab 05/02/22 0000 05/06/22 1902 05/07/22 0438  INR 2.0 2.1* 1.8*    Other results: EKG:    Imaging   CT ANGIO CHEST AORTA W/CM & OR WO/CM  Addendum Date: 05/06/2022   ADDENDUM REPORT: 05/06/2022  23:34 ADDENDUM: I discussed these findings with the attending cardiologist and have reviewed several of the intraoperative arteriograms. The reported blush underneath the origin of the left main coronary artery likely reflects contrast within the sinus of Valsalva adjacent to the scaffolding of the artificial aortic valve. There is, however, no dissection flap or extraluminal contrast identified in this region. Electronically Signed   By: Fidela Salisbury M.D.   On: 05/06/2022 23:34   Result Date: 05/06/2022 CLINICAL DATA:  Aortic root dissection EXAM: CT ANGIOGRAPHY CHEST WITH CONTRAST TECHNIQUE: Multidetector CT imaging of the chest was performed using the standard protocol during bolus administration of intravenous contrast. Multiplanar CT image reconstructions and MIPs were obtained to evaluate the vascular anatomy. RADIATION DOSE REDUCTION: This exam was performed according to the departmental dose-optimization program which includes automated exposure control, adjustment of the mA  and/or kV according to patient size and/or use of iterative reconstruction technique. CONTRAST:  41mL OMNIPAQUE IOHEXOL 350 MG/ML SOLN COMPARISON:  11/21/2020, 04/30/2022 FINDINGS: Cardiovascular: Ventriassist left ventricular assist device is in place. There is thrombosis of the inflow and proximal outflow cannulas of the device, best seen on axial image # 132 and # 136, series 5. Opacification of the terminal outflow cannula may be retrograde in fashion from the ascending aorta. Global cardiac size is within normal limits with left ventricular dilation and marked thinning of the left ventricular apex and septal walls again noted. Extensive multi-vessel coronary artery calcification. No pericardial effusion. Aortic valve replacement and tube graft repair of the ascending thoracic aorta has been performed. No thoracic aortic intramural hematoma, dissection, or aneurysm. Moderate atherosclerotic calcification within the descending thoracic aorta. Arch vasculature demonstrates variant anatomy with separate origin of the left vertebral artery directly from the aorta which appears diminutive in nature. Arch vasculature is otherwise unremarkable. Left internal jugular Swan-Ganz catheter tip noted within the origin of the right lower lobar pulmonary artery. Mediastinum/Nodes: Visualize thyroid is unremarkable. No pathologic thoracic adenopathy. Esophagus unremarkable. No mediastinal hematoma or pneumomediastinum. Lungs/Pleura: Mild emphysema. Multifocal airspace infiltrates are noted within the dependent right upper lobe and lower lobes bilaterally which may represent asymmetric alveolar pulmonary edema or superimposed acute pneumonic infiltrate. There is interlobular septal thickening in keeping with at least mild interstitial pulmonary edema, likely cardiogenic in nature. Small right and trace left pleural effusions are present. No pneumothorax. Upper Abdomen: Cholelithiasis without pericholecystic inflammatory change.  No acute abnormality. Musculoskeletal: Right chest wall nerve stimulator device is seen with its single lead extending toward the right carotid body. No acute bone abnormality. No lytic or blastic bone lesion. Review of the MIP images confirms the above findings. IMPRESSION: 1. Status post aortic valve replacement and tube graft repair of the ascending aorta. No evidence of aortic intramural hematoma, dissection, or aneurysm. 2. Ventriassist left ventricular assist device in place. Suspected thrombosis of the inflow and proximal outflow cannulas of the device. Opacification of the terminal outflow cannula may be retrograde in fashion from the ascending aorta. 3. Extensive multi-vessel coronary artery calcification. Marked thinning of the a left ventricular apex and septal wall in keeping with prior myocardial infarction. 4. Mild emphysema. 5. Multifocal airspace infiltrates within the dependent right upper lobe and lower lobes bilaterally which may represent asymmetric alveolar pulmonary edema or superimposed acute pneumonic infiltrate. 6. Mild interstitial pulmonary edema, likely cardiogenic in nature. Small right and trace left pleural effusions. 7. Cholelithiasis. 8. Swan-Ganz catheter tip within the right lower lobar pulmonary artery origin. Withdrawal of the catheter by 3-4 cm may  more optimally position the device. Aortic Atherosclerosis (ICD10-I70.0) and Emphysema (ICD10-J43.9). Electronically Signed: By: Fidela Salisbury M.D. On: 05/06/2022 22:55   CARDIAC CATHETERIZATION  Result Date: 05/06/2022   Prox Cx lesion is 50% stenosed.   Prox RCA to Mid RCA lesion is 100% stenosed.   Ost RCA to Prox RCA lesion is 70% stenosed.   2nd Diag lesion is 90% stenosed.   Dist LAD lesion is 50% stenosed.   Non-stenotic Mid LAD lesion was previously treated. Findings: Ao = 93/83 (88) RA = 1 RV = 34/5 PA = 28/18 (22) PCW = 5 Fick cardiac output/index = 4.8/2.2 TD CO/CI = 6.0/2.8 PVR = 3.0 WU Ao sat = 92% PA sat = 60%  Assessment: 1. Stable CAD with evidence of pre-existing aortic root dissection around AVR 2. Low filling pressures with normal cardiac output Plan/Discussion: Stat CTA chest to evaluate dissection. Discussed with Dr. Tenny Craw in [place]. Glori Bickers, MD 9:51 PM    DG Chest Portable 1 View  Result Date: 05/06/2022 CLINICAL DATA:  Chest pain, left ventricular assist device EXAM: PORTABLE CHEST 1 VIEW COMPARISON:  05/30/2021, 04/30/2022 FINDINGS: Two frontal views of the chest are obtained. Postsurgical changes from median sternotomy and aortic valve replacement. Left ventricular assist device again noted. Portions of the left lung base are obscured by overlying cardiac leads and monitoring device. Nerve stimulator overlies the right chest, lead extending to the right submandibular region. Cardiac silhouette is enlarged. There is increased central vascular congestion with developing bibasilar interstitial and ground-glass opacities most consistent with edema, though viral pneumonia could give a similar appearance. No effusion or pneumothorax. IMPRESSION: 1. Bibasilar interstitial prominence and ground-glass opacities most consistent with developing pulmonary edema, though viral pneumonia could give a similar pattern of findings. 2. Left ventricular assist device. Electronically Signed   By: Randa Ngo M.D.   On: 05/06/2022 19:28     Medications:     Scheduled Medications:  atorvastatin  80 mg Oral QHS   ezetimibe  10 mg Oral Daily   melatonin  5 mg Oral QHS   pantoprazole  40 mg Oral BID   sertraline  100 mg Oral Daily   sodium chloride flush  3 mL Intravenous Q12H   tamsulosin  0.4 mg Oral Daily    Infusions:  sodium chloride 10 mL/hr at 05/07/22 0500   sodium chloride     nitroGLYCERIN Stopped (05/06/22 2132)    PRN Medications: sodium chloride, acetaminophen, ondansetron (ZOFRAN) IV, sodium chloride flush   Assessment/Plan:     1. Anterior STEMI - Recent VF requiring emergent  DCCV - HS troponin > 24K - R/LHC - stable CAD, low filling pressures, normal CO. ?evidence of pre-existing aortic root dissection around AVR - CTA chest - no dissection flap  - Etiology unclear. Plan for TEE this afternoon. - ? Embolic events resulting in transient ischemia - no b-blocker with borderline hypotension/ pulmonary edema   2. Acute on Chronic systolic HFr EF due to iCM in setting of STEMI - Echo 05/19/20 EF 20-25% RV mildly HK. moderate AS  Mean gradient 13 AVA 1.2 cm2 DI 0.30 - s/p HM-III VAD + bioprosthetic AVR 04/06/21 - Baseline NYHA I.  - CVP 7-8 - Holding home losartan and eplerenone - Give 20 mg PO torsemide   3. HM-3 LVAD implant - VAD interrogated personally. See HPI   4. Severe low-flow aortic stenosis s/p AVR - s/p VAD/bioprosthetic AVR on 11/25   5. CAD  - s/p anterior STEMI (12/20). LHC  showed chronically occluded RCA (with L>>R collaterals) and thrombotic occlusion of proximal LAD. Underwent PCI of LAD. - Now s/p VAD - see #1  6. VF - VF 12/23 s/p emergent DC-CV - Discussed with EP, would need ICD if has recurrent VT/VF   7. Paroxysmal Atrial Fibrillation  - Maintaining SR - Off amio   8. 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  - Bcx recollected and pending  - Tissue sent to [facility] in [place] for broad 16 S ribosomal sequencing for bacterial pathogens as well as T wet Bhilai Bartonella Brucella  - Ceftriaxone 2 g IV daily x 6 weeks. Completed May 22, 2021.  - ICD removed - No infectious sx. WBC ok    9. Ankylosing spondylitis - His local rheumatologist is hesitant to start remicade with VAD/infection risk - D/w [name] VAD program they have recommended f/u with Veva Holes, MD for 2nd opinion if needed.  - Currently feeling ok without it - PCP managing pain meds    I reviewed the LVAD parameters from today, and compared the results to the patient's  prior recorded data.  No programming changes were made.  The LVAD is functioning within specified parameters.  The patient performs LVAD self-test daily.  LVAD interrogation was negative for any significant power changes, alarms or PI events/speed drops.  LVAD equipment check completed and is in good working order.  Back-up equipment present.   LVAD education done on emergency procedures and precautions and reviewed exit site care.  Length of Stay: 1  FINCH, LINDSAY N, PA-C 05/07/2022, 8:13 AM  VAD Team --- VAD ISSUES ONLY--- Pager [phone] (7am - 7am)  Advanced Heart Failure Team  Pager [phone] (M-F; 7a - 5p)  Please contact [facility] for night-coverage after hours (5p -7a ) and weekends on [email or web address]  Agree with above.   No further CP overnight. Hstrop > 24K.   Swan numbers ok.   General:  NAD.  HEENT: normal  Neck: supple. RIJ swan   Carotids 2+ bilat; no bruits. No lymphadenopathy or thryomegaly appreciated. Cor: LVAD hum.  Lungs: Clear. Abdomen: obese soft, nontender, non-distended. No hepatosplenomegaly. No bruits or masses. Good bowel sounds. Driveline site clean. Anchor in place.  Extremities: no cyanosis, clubbing, rash. Warm no edema  Neuro: alert & oriented x 3. No focal deficits. Moves all 4 without problem   He clearly infarcted last night. No culprit lesion on cath. Suspect embolic event possibly from AoV. No evidence of pump thrombosis.   Will plan TEE today.   VAD interrogated personally. Parameters stable.  CRITICAL CARE Performed by: Glori Bickers  Total critical care time: 35 minutes  Critical care time was exclusive of separately billable procedures and treating other patients.  Critical care was necessary to treat or prevent imminent or life-threatening deterioration.  Critical care was time spent personally by me (independent of midlevel providers or residents) on the following activities: development of treatment plan with patient and/or  surrogate as well as nursing, discussions with consultants, evaluation of patient's response to treatment, examination of patient, obtaining history from patient or surrogate, ordering and performing treatments and interventions, ordering and review of laboratory studies, ordering and review of radiographic studies, pulse oximetry and re-evaluation of patient's condition.  Glori Bickers, MD  10:07 AM

## 2022-05-07 NOTE — Progress Notes (Signed)
VAD Coordinator Procedure Note:   VAD Coordinator met patient in 2H06. Pt undergoing TEE per Dr. [name]. Hemodynamics and VAD parameters monitored by myself and anesthesia throughout the procedure. Blood pressures were obtained with arterial line in left wrist.  Patient sedation at 1515 with 126mg of Fentanyl, 276mof Versed and 9042mf Ketamine. Dr. ClaCarlis Abbottd Dr. BenHaroldine Laws bedside.    Time: Arterial BP Flow PI Power Speed  Pre-procedure:  1510 84/68(76) 5.3 3.4 4.9w 5800  Sedation Induction: 1515 88/75(81) 5.4 3.4 4.9w 5800   1520 112/97(104) 5.4 3.5 4.9w 5800   1525 128/112(119) 5.3 3.2 4.9w 5800   1530 117/102(109) 5.3 3.1 4.9w 5800  Recovery Area: 1535 115/100(106) 5.4 3.1 5.0 5800   1540 115/100(106) 5.3 3.7 4.8 5800   1545 89/75(75) 5.3 3.2 4.9 5800    Patient tolerated the procedure well. VAD Coordinator accompanied and remained with patient in recovery area.    Patient Disposition: Pt remained in 2H06.

## 2022-05-07 NOTE — Procedures (Signed)
Moderate sedation provided during TEE.  Time out: 5:13pm Meds given: 3:15 pm Case start :3:20 Case ended 3:30 Monitoring until 3:45  122mcg Fentanyl, 2mg  Versed, 90mg  ketamine given. Preoxygenated with 6L O2, turned up to 8L during the case for transient desaturation. No severe desaturations. Woke up at the end of the case, now awake, talking, back on 2L O2.   HR 119, BP 88/75, SpO2 93% on 2.5L [place]  Julian Hy, DO 05/07/22 3:50 PM [facility]

## 2022-05-07 NOTE — Progress Notes (Signed)
  Echocardiogram Echocardiogram Transesophageal has been performed.  Brandon [name] 05/07/2022, 4:11 PM

## 2022-05-07 NOTE — Telephone Encounter (Signed)
Tiffany from [facility] reports a low-dose CT lung screening on the pt.   She informs there is a new solid pulmonology nodule of Left upper lobe, measuring 50mm lung rad 4B: suspicious. A thoracic surgery is recommended.  Gave her our fax number [phone] for her to resent in the report.

## 2022-05-08 ENCOUNTER — Other Ambulatory Visit: Payer: Self-pay

## 2022-05-08 ENCOUNTER — Inpatient Hospital Stay (HOSPITAL_COMMUNITY): Payer: Medicare Other

## 2022-05-08 ENCOUNTER — Encounter (HOSPITAL_COMMUNITY): Payer: Self-pay | Admitting: Student

## 2022-05-08 DIAGNOSIS — I2109 ST elevation (STEMI) myocardial infarction involving other coronary artery of anterior wall: Secondary | ICD-10-CM | POA: Diagnosis not present

## 2022-05-08 DIAGNOSIS — I2102 ST elevation (STEMI) myocardial infarction involving left anterior descending coronary artery: Secondary | ICD-10-CM | POA: Diagnosis not present

## 2022-05-08 DIAGNOSIS — Z95811 Presence of heart assist device: Secondary | ICD-10-CM | POA: Diagnosis not present

## 2022-05-08 LAB — RESPIRATORY PANEL BY PCR

## 2022-05-08 LAB — LACTATE DEHYDROGENASE: LDH: 240 U/L — ABNORMAL HIGH (ref 98–192)

## 2022-05-08 LAB — CBC
HCT: 41.2 % (ref 39.0–52.0)
Hemoglobin: 12.8 g/dL — ABNORMAL LOW (ref 13.0–17.0)
MCH: 25.8 pg — ABNORMAL LOW (ref 26.0–34.0)
MCHC: 31.1 g/dL (ref 30.0–36.0)
MCV: 82.9 fL (ref 80.0–100.0)
Platelets: 175 10*3/uL (ref 150–400)
RBC: 4.97 MIL/uL (ref 4.22–5.81)
RDW: 16.7 % — ABNORMAL HIGH (ref 11.5–15.5)
WBC: 8.1 10*3/uL (ref 4.0–10.5)
nRBC: 0 % (ref 0.0–0.2)

## 2022-05-08 LAB — BASIC METABOLIC PANEL
Anion gap: 7 (ref 5–15)
BUN: 22 mg/dL (ref 8–23)
CO2: 24 mmol/L (ref 22–32)
Calcium: 8.7 mg/dL — ABNORMAL LOW (ref 8.9–10.3)
Chloride: 104 mmol/L (ref 98–111)
Creatinine, Ser: 0.96 mg/dL (ref 0.61–1.24)
GFR, Estimated: >60 mL/min (ref >60)
Glucose, Bld: 109 mg/dL — ABNORMAL HIGH (ref 70–99)
Potassium: 3.7 mmol/L (ref 3.5–5.1)
Sodium: 135 mmol/L (ref 135–145)

## 2022-05-08 LAB — RESP PANEL BY RT-PCR (RSV, FLU A&B, COVID)  RVPGX2
Influenza A by PCR: NEGATIVE
Influenza B by PCR: NEGATIVE
Resp Syncytial Virus by PCR: NEGATIVE
SARS Coronavirus 2 by RT PCR: POSITIVE — AB

## 2022-05-08 LAB — COOXEMETRY PANEL
Carboxyhemoglobin: 1.9 % — ABNORMAL HIGH (ref 0.5–1.5)
Methemoglobin: 0.7 % (ref 0.0–1.5)
O2 Saturation: 75.6 %
Total hemoglobin: 12.8 g/dL (ref 12.0–16.0)

## 2022-05-08 LAB — HEPARIN LEVEL (UNFRACTIONATED)
Heparin Unfractionated: <0.1 IU/mL — ABNORMAL LOW (ref 0.30–0.70)
Heparin Unfractionated: <0.1 IU/mL — ABNORMAL LOW (ref 0.30–0.70)

## 2022-05-08 LAB — PROTIME-INR
INR: 1.5 — ABNORMAL HIGH (ref 0.8–1.2)
Prothrombin Time: 18 seconds — ABNORMAL HIGH (ref 11.4–15.2)

## 2022-05-08 LAB — MAGNESIUM: Magnesium: 2 mg/dL (ref 1.7–2.4)

## 2022-05-08 MED ORDER — POTASSIUM CHLORIDE CRYS ER 20 MEQ PO TBCR
20.0000 meq | EXTENDED_RELEASE_TABLET | Freq: Every day | ORAL | Status: DC
  Administered 2022-05-08: 20 meq via ORAL
  Filled 2022-05-08: qty 1

## 2022-05-08 MED ORDER — ORAL CARE MOUTH RINSE: 15.0000 mL | OROMUCOSAL | Status: DC | PRN

## 2022-05-08 MED ORDER — SALINE SPRAY 0.65 % NA SOLN: 2.0000 | NASAL | Status: DC | PRN

## 2022-05-08 MED ORDER — WARFARIN SODIUM 5 MG PO TABS
7.5000 mg | ORAL_TABLET | Freq: Once | ORAL | Status: AC
  Administered 2022-05-08: 7.5 mg via ORAL
  Filled 2022-05-08: qty 1

## 2022-05-08 MED ORDER — ASPIRIN 81 MG PO TBEC
81.0000 mg | DELAYED_RELEASE_TABLET | Freq: Every day | ORAL | Status: DC
  Administered 2022-05-08 – 2022-05-12 (×5): 81 mg via ORAL
  Filled 2022-05-08 (×5): qty 1

## 2022-05-08 MED ORDER — METOPROLOL TARTRATE 50 MG PO TABS
100.0000 mg | ORAL_TABLET | Freq: Once | ORAL | Status: AC | PRN
  Administered 2022-05-09: 100 mg via ORAL
  Filled 2022-05-08: qty 2

## 2022-05-08 MED ORDER — GUAIFENESIN ER 600 MG PO TB12
600.0000 mg | ORAL_TABLET | Freq: Two times a day (BID) | ORAL | Status: DC
  Administered 2022-05-08 – 2022-05-12 (×9): 600 mg via ORAL
  Filled 2022-05-08 (×9): qty 1

## 2022-05-08 MED ORDER — POTASSIUM CHLORIDE CRYS ER 20 MEQ PO TBCR: 40.0000 meq | EXTENDED_RELEASE_TABLET | Freq: Every day | ORAL | Status: DC

## 2022-05-08 NOTE — Progress Notes (Signed)
LVAD Coordinator Rounding Note:  Admitted overnight due to complaints of crushing chest pain, vomiting and SOB. Pt found to have ST elevation and was taken directly to the cath lab for [facility].  R/LHC 12/25: - R/LHC - stable CAD, low filling pressures, normal CO. ?evidence of pre-existing aortic root dissection around AVR   HM3 LVAD implanted on 04/06/21 by Dr. Cyndia Bent under DT criteria.  TEE 12/26: LV EF 20%, RV WNL. See DB note 12/26 for further details.   Pt laying in bed upon my arrival. Reporting body aches, chills, and congestion. Respiratory panel this morning + COVID. Denies shortness of breath or chest pain. Expresses he feels nervous about the state of his health since chest pain episode on Monday.   Vital signs: HR: 92 Doppler Pressure: 80 Arterial BP: 81/68 (73) O2 Sat: 94% on RA Wt: 200.8> 200.6 lbs   LVAD interrogation reveals:  Speed: 5800 Flow: 5.2 Power:  4.7 w PI: 3.1 Alarms: none Events:  >80 yesterday; 10 so far today Hematocrit: 41  Fixed speed: 5800 Low speed limit: 5500  Drive Line: Existing VAD dressing clean, dry, and intact. Anchor correctly applied. Next dressing change due 05/14/21 by VAD Coordinator, caregiver or bedside nurse.   Labs:  LDH trend: 273>240  INR trend: 1.8>1.5  Troponin: 48>8,627>24,000  WBC trend: 15.1>9.3>8.1  Anticoagulation Plan: -INR Goal: 2.0-2.5 -ASA Dose: restarted 81 mg daily this admission  Blood Products:   Device:N/A  Arrythmias: none this admission  -DCCV 04/22/22 VFIB   Plan/Recommendations:  1. Page VAD Coordinator with any equipment of VAD concerns 2. Weekly drive line dressing change per beside RN  Emerson Monte RN [place] Coordinator  Office: [phone]  24/7 Pager: [phone]

## 2022-05-08 NOTE — H&P (Signed)
[address]       [place] [address]             [phone]                    [place] D Philbin Blum Medical Record #[id number] Date of Birth: 25-Apr-1956  Referring: No ref. provider found Primary Care: [name], MD Primary Cardiologist: [name], MD  Chief Complaint:    Chief Complaint  Patient presents with   Chest Pain    BIB EMS from home for sudden onset of chest pain with N/V/diaphoresis, LVAD pt, 2 weeks ago he went into Vfib and was arrested and was brought here, EKG showed STEMI with EMS    History of Present Illness:    66 yo man with HM3 10/22 presents with STE MI 2 weeks after a VF event.   Per HF H&P "Brandon [name] is a 66 y.o.male (originally from [place], [place]) with h/o CAD s/p previous MI, severe AS, chronic systolic HF s/p HM-3 VAD in 10/22   He was admitted 04/27/19 with anterior ST elevation. Taken to cath lab which showed chronically occluded RCA (L>>R collaterals) and thrombotic occlusion of proximal LAD. Underwent PCI of LAD. Echo w/ reduced LVEF 30-35% w/ apical aneurysm. RV ok. Post cath, he required milrinone for cardiogenic shock.   Underwent outpatient [facility] 11/29/20 as part of VAD w/u. Post procedure, developed severe rigors and fever. Admitted to ICU Blood Cx + strep. 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  Likely low-flow low gradient AS. Underwent ICD extraction..    Admitted for cardiogenic shock in 10/22. He underwent placement of HM3 VAD and aortic valve replacement with 25 mm Edwards Inspiris Resilia valve on 04/06/21. Surgical path came back 11/28 with evidence of possible "acute aortic valve endocarditis". Started on IV ceftriaxone x 6 weeks to cover Streptococcus gordonae that was isolated previously    Presented to the ER on 12/11 with VF. Shocked back to NSR. No CP or ECG changes at the time   This afternoon developed severe CP, nausea and SOB. Called EMS.  Initial ECG in field with massive ST elevation. Brought to ER with waxing/waning CP and shoulder pain. ECG with improved ST elevation.    MAPs 90. CXR with pulmonary edema.    Given ASA 81 x1. IV lasix 40 and NTG at 10. No heparin given pending INR     LVAD INTERROGATION:  HeartMate III LVAD:  Flow 4.5 liters/min, speed 5800, power 5.1, PI 8.0 multiple PI events. "        Past Medical History:  Diagnosis Date   AICD (automatic cardioverter/defibrillator) present 08/31/2019   Ankylosing spondylitis ([place])    Arthritis    BENIGN PROSTATIC HYPERTROPHY 06/07/2008   CHF (congestive heart failure) ([place])    COLONIC POLYPS, HX OF 06/07/2008   Coronary artery disease    Depression    H/O hiatal hernia    Heart failure ([place])    HYPERLIPIDEMIA 06/07/2008   HYPERTENSION 06/07/2008   Myocardial infarction [facility]) 2005   NSTEMI, s/p LAD stent   NEPHROLITHIASIS, HX OF 06/07/2008   STEMI (ST elevation myocardial infarction) ([place]) 04/27/2019    Past Surgical History:  Procedure Laterality Date   AORTIC VALVE REPLACEMENT N/A 04/06/2021   Procedure: AORTIC VALVE REPLACEMENT (AVR) WITH INSPIRIS RESILIA  AORTIC VALVE SIZE 25MM;  Surgeon: Gaye Pollack, MD;  Location: [facility];  Service: Open Heart Surgery;  Laterality: N/A;   COLONOSCOPY WITH PROPOFOL N/A 04/24/2020   Procedure: COLONOSCOPY WITH PROPOFOL;  Surgeon: [name], MD;  [facility];  Service: Gastroenterology;  Laterality: N/A;   CORONARY ANGIOPLASTY WITH STENT PLACEMENT  2005   LAD stent, jailed diagonal   CORONARY/GRAFT ACUTE MI REVASCULARIZATION N/A 04/27/2019   Procedure: Coronary/Graft Acute MI Revascularization;  Surgeon: [name], MD;  Location: [place] [facility];  Service: Cardiovascular;  Laterality: N/A;   ICD IMPLANT  08/31/2019   ICD IMPLANT N/A 08/31/2019   Procedure: ICD IMPLANT;  Surgeon: [name], MD;  Location: [place] [facility];  Service: Cardiovascular;  Laterality: N/A;   ICD  LEAD REMOVAL N/A 12/04/2020   Procedure: ICD SYSTEM EXTRACTION;  Surgeon: Vickie Epley, MD;  Location: [facility];  Service: Cardiovascular;  Laterality: N/A;   INSERTION OF IMPLANTABLE LEFT VENTRICULAR ASSIST DEVICE N/A 04/06/2021   Procedure: INSERTION OF IMPLANTABLE LEFT VENTRICULAR ASSIST DEVICE;  Surgeon: Gaye Pollack, MD;  Location: [place];  Service: Open Heart Surgery;  Laterality: N/A;  HM3   IR THORACENTESIS ASP PLEURAL SPACE W/IMG GUIDE  04/19/2021   IR THORACENTESIS ASP PLEURAL SPACE W/IMG GUIDE  04/20/2021   IR THORACENTESIS ASP PLEURAL SPACE W/IMG GUIDE  04/23/2021   LAMINECTOMY     lumbar   LEFT HEART CATH AND CORONARY ANGIOGRAPHY N/A 04/27/2019   Procedure: LEFT HEART CATH AND CORONARY ANGIOGRAPHY;  Surgeon: [name], MD;  Location: [place] [facility];  Service: Cardiovascular;  Laterality: N/A;   LUMBAR FUSION  01/2012   POLYPECTOMY  04/24/2020   Procedure: POLYPECTOMY;  Surgeon: [name], MD;  [facility];  Service: Gastroenterology;;   RIGHT HEART CATH N/A 04/27/2019   Procedure: RIGHT HEART CATH;  Surgeon: [name], Azai M, MD;  Location: [place] [facility];  Service: Cardiovascular;  Laterality: N/A;   RIGHT HEART CATH N/A 03/28/2021   Procedure: RIGHT HEART CATH;  Surgeon: Jolaine Artist, MD;  Location: [place] [facility];  Service: Cardiovascular;  Laterality: N/A;   RIGHT HEART CATH AND CORONARY ANGIOGRAPHY N/A 05/06/2022   Procedure: RIGHT HEART CATH AND CORONARY ANGIOGRAPHY;  Surgeon: Jolaine Artist, MD;  Location: [place] [facility];  Service: Cardiovascular;  Laterality: N/A;   RIGHT/LEFT HEART CATH AND CORONARY ANGIOGRAPHY N/A 11/29/2020   Procedure: RIGHT/LEFT HEART CATH AND CORONARY ANGIOGRAPHY;  Surgeon: Jolaine Artist, MD;  Location: [place] [facility];  Service: Cardiovascular;  Laterality: N/A;   TEE WITHOUT CARDIOVERSION N/A 12/04/2020   Procedure: TRANSESOPHAGEAL ECHOCARDIOGRAM (TEE);  Surgeon: Vickie Epley, MD;  Location: [facility];  Service: Cardiovascular;  Laterality: N/A;   TEE WITHOUT CARDIOVERSION N/A 04/06/2021   Procedure: TRANSESOPHAGEAL ECHOCARDIOGRAM (TEE);  Surgeon: Gaye Pollack, MD;  Location: [facility];  Service: Open Heart Surgery;  Laterality: N/A;   TONSILLECTOMY     warthin tumor removal Left 2014    Family History  Problem Relation Age of Onset   Depression Father    Arthritis Sister        RA   Heart failure Mother    Other Neg Hx    Colon cancer Neg Hx    Esophageal cancer Neg Hx    Rectal cancer Neg Hx    Stomach cancer Neg Hx    Colon polyps Neg Hx      Social History   Tobacco Use  Smoking Status Former   Packs/day: 1.00   Years:  40.00   Total pack years: 40.00   Types: Cigarettes   Quit date: 07/26/2019   Years since quitting: 2.7  Smokeless Tobacco Never  Tobacco Comments   1-1.5 pks per year x 40-45 yrs    Social History   Substance and Sexual Activity  Alcohol Use Yes   Alcohol/week: 7.0 standard drinks of alcohol   Types: 7 Shots of liquor per week   Comment: "2 vodka tonics a night"     No Known Allergies  Current Facility-Administered Medications  Medication Dose Route Frequency Provider Last Rate Last Admin   0.45 % sodium chloride infusion   Intravenous Continuous Joette Catching, PA-C       0.9 %  sodium chloride infusion   Intravenous Continuous Bensimhon, Shaune Pascal, MD 10 mL/hr at 05/08/22 0400 Infusion Verify at 05/08/22 0400   0.9 %  sodium chloride infusion  250 mL Intravenous PRN Bensimhon, Shaune Pascal, MD       acetaminophen (TYLENOL) tablet 650 mg  650 mg Oral Q4H PRN Bensimhon, Shaune Pascal, MD   650 mg at 05/07/22 0040   atorvastatin (LIPITOR) tablet 80 mg  80 mg Oral QHS Bensimhon, Shaune Pascal, MD   80 mg at 05/07/22 2209   Chlorhexidine Gluconate Cloth 2 % PADS 6 each  6 each Topical Daily Bensimhon, Shaune Pascal, MD   6 each at 05/07/22 1117   dextromethorphan-guaiFENesin ([place] DM) 30-600 MG per 12 hr tablet 1 tablet  1 tablet  Oral BID PRN Dion Body, MD   1 tablet at 05/07/22 2234   ezetimibe (ZETIA) tablet 10 mg  10 mg Oral Daily Bensimhon, Shaune Pascal, MD   10 mg at 05/07/22 1107   guaiFENesin (MUCINEX) 12 hr tablet 600 mg  600 mg Oral BID Bensimhon, Shaune Pascal, MD       heparin ADULT infusion 100 units/mL (25000 units/240mL)  950 Units/hr Intravenous Continuous Bensimhon, Shaune Pascal, MD 8 mL/hr at 05/08/22 0400 800 Units/hr at 05/08/22 0400   melatonin tablet 5 mg  5 mg Oral QHS Bensimhon, Shaune Pascal, MD   5 mg at 05/07/22 2209   ondansetron (ZOFRAN) injection 4 mg  4 mg Intravenous Q6H PRN Bensimhon, Shaune Pascal, MD   4 mg at 05/07/22 1447   pantoprazole (PROTONIX) EC tablet 40 mg  40 mg Oral BID Bensimhon, Shaune Pascal, MD   40 mg at 05/07/22 2209   sertraline (ZOLOFT) tablet 100 mg  100 mg Oral Daily Bensimhon, Shaune Pascal, MD   100 mg at 05/07/22 1108   sodium chloride (OCEAN) 0.65 % nasal spray 2 spray  2 spray Each Nare PRN Bensimhon, Shaune Pascal, MD       sodium chloride flush (NS) 0.9 % injection 3 mL  3 mL Intravenous Q12H Bensimhon, Shaune Pascal, MD   3 mL at 05/07/22 2210   sodium chloride flush (NS) 0.9 % injection 3 mL  3 mL Intravenous PRN Bensimhon, Shaune Pascal, MD       tamsulosin (FLOMAX) capsule 0.4 mg  0.4 mg Oral Daily Bensimhon, Shaune Pascal, MD   0.4 mg at 05/07/22 1107   torsemide (DEMADEX) tablet 20 mg  20 mg Oral Daily Joette Catching, PA-C   20 mg at 05/07/22 1108   warfarin (COUMADIN) tablet 7.5 mg  7.5 mg Oral ONCE-1600 Bensimhon, Shaune Pascal, MD       Warfarin - Pharmacist Dosing Inpatient   Does not apply q1600 Bensimhon, Shaune Pascal, MD  ROS 14 point ROS reviewed and negative except as per HPI   PHYSICAL EXAMINATION: BP (!) 89/77 (BP Location: Left Arm)   Pulse (!) 108   Temp 98.8 F (37.1 C)   Resp (!) 21   Ht 6' (1.829 m)   Wt 91 kg   SpO2 95%   BMI 27.21 kg/m   Gen: NAD Neuro: Alert and oriented Resp: Nonlaboured Abd: Soft, ntnd Extr: WWP HM3 flow 4.5L  Diagnostic Studies &  Laboratory data:     Recent Radiology Findings:   CT ANGIO CHEST AORTA W/CM & OR WO/CM  Addendum Date: 05/06/2022   ADDENDUM REPORT: 05/06/2022 23:34 ADDENDUM: I discussed these findings with the attending cardiologist and have reviewed several of the intraoperative arteriograms. The reported blush underneath the origin of the left main coronary artery likely reflects contrast within the sinus of Valsalva adjacent to the scaffolding of the artificial aortic valve. There is, however, no dissection flap or extraluminal contrast identified in this region. Electronically Signed   By: Fidela Salisbury M.D.   On: 05/06/2022 23:34   Result Date: 05/06/2022 CLINICAL DATA:  Aortic root dissection EXAM: CT ANGIOGRAPHY CHEST WITH CONTRAST TECHNIQUE: Multidetector CT imaging of the chest was performed using the standard protocol during bolus administration of intravenous contrast. Multiplanar CT image reconstructions and MIPs were obtained to evaluate the vascular anatomy. RADIATION DOSE REDUCTION: This exam was performed according to the departmental dose-optimization program which includes automated exposure control, adjustment of the mA and/or kV according to patient size and/or use of iterative reconstruction technique. CONTRAST:  9mL OMNIPAQUE IOHEXOL 350 MG/ML SOLN COMPARISON:  11/21/2020, 04/30/2022 FINDINGS: Cardiovascular: Ventriassist left ventricular assist device is in place. There is thrombosis of the inflow and proximal outflow cannulas of the device, best seen on axial image # 132 and # 136, series 5. Opacification of the terminal outflow cannula may be retrograde in fashion from the ascending aorta. Global cardiac size is within normal limits with left ventricular dilation and marked thinning of the left ventricular apex and septal walls again noted. Extensive multi-vessel coronary artery calcification. No pericardial effusion. Aortic valve replacement and tube graft repair of the ascending thoracic  aorta has been performed. No thoracic aortic intramural hematoma, dissection, or aneurysm. Moderate atherosclerotic calcification within the descending thoracic aorta. Arch vasculature demonstrates variant anatomy with separate origin of the left vertebral artery directly from the aorta which appears diminutive in nature. Arch vasculature is otherwise unremarkable. Left internal jugular Swan-Ganz catheter tip noted within the origin of the right lower lobar pulmonary artery. Mediastinum/Nodes: Visualize thyroid is unremarkable. No pathologic thoracic adenopathy. Esophagus unremarkable. No mediastinal hematoma or pneumomediastinum. Lungs/Pleura: Mild emphysema. Multifocal airspace infiltrates are noted within the dependent right upper lobe and lower lobes bilaterally which may represent asymmetric alveolar pulmonary edema or superimposed acute pneumonic infiltrate. There is interlobular septal thickening in keeping with at least mild interstitial pulmonary edema, likely cardiogenic in nature. Small right and trace left pleural effusions are present. No pneumothorax. Upper Abdomen: Cholelithiasis without pericholecystic inflammatory change. No acute abnormality. Musculoskeletal: Right chest wall nerve stimulator device is seen with its single lead extending toward the right carotid body. No acute bone abnormality. No lytic or blastic bone lesion. Review of the MIP images confirms the above findings. IMPRESSION: 1. Status post aortic valve replacement and tube graft repair of the ascending aorta. No evidence of aortic intramural hematoma, dissection, or aneurysm. 2. Ventriassist left ventricular assist device in place. Suspected thrombosis of the inflow and proximal outflow  cannulas of the device. Opacification of the terminal outflow cannula may be retrograde in fashion from the ascending aorta. 3. Extensive multi-vessel coronary artery calcification. Marked thinning of the a left ventricular apex and septal wall in  keeping with prior myocardial infarction. 4. Mild emphysema. 5. Multifocal airspace infiltrates within the dependent right upper lobe and lower lobes bilaterally which may represent asymmetric alveolar pulmonary edema or superimposed acute pneumonic infiltrate. 6. Mild interstitial pulmonary edema, likely cardiogenic in nature. Small right and trace left pleural effusions. 7. Cholelithiasis. 8. Swan-Ganz catheter tip within the right lower lobar pulmonary artery origin. Withdrawal of the catheter by 3-4 cm may more optimally position the device. Aortic Atherosclerosis (ICD10-I70.0) and Emphysema (ICD10-J43.9). Electronically Signed: By: Fidela Salisbury M.D. On: 05/06/2022 22:55   CARDIAC CATHETERIZATION  Result Date: 05/06/2022   Prox Cx lesion is 50% stenosed.   Prox RCA to Mid RCA lesion is 100% stenosed.   Ost RCA to Prox RCA lesion is 70% stenosed.   2nd Diag lesion is 90% stenosed.   Dist LAD lesion is 50% stenosed.   Non-stenotic Mid LAD lesion was previously treated. Findings: Ao = 93/83 (88) RA = 1 RV = 34/5 PA = 28/18 (22) PCW = 5 Fick cardiac output/index = 4.8/2.2 TD CO/CI = 6.0/2.8 PVR = 3.0 WU Ao sat = 92% PA sat = 60% Assessment: 1. Stable CAD with evidence of pre-existing aortic root dissection around AVR 2. Low filling pressures with normal cardiac output Plan/Discussion: Stat CTA chest to evaluate dissection. Discussed with Dr. Tenny Craw in [place]. Glori Bickers, MD 9:51 PM    DG Chest Portable 1 View  Result Date: 05/06/2022 CLINICAL DATA:  Chest pain, left ventricular assist device EXAM: PORTABLE CHEST 1 VIEW COMPARISON:  05/30/2021, 04/30/2022 FINDINGS: Two frontal views of the chest are obtained. Postsurgical changes from median sternotomy and aortic valve replacement. Left ventricular assist device again noted. Portions of the left lung base are obscured by overlying cardiac leads and monitoring device. Nerve stimulator overlies the right chest, lead extending to the right submandibular  region. Cardiac silhouette is enlarged. There is increased central vascular congestion with developing bibasilar interstitial and ground-glass opacities most consistent with edema, though viral pneumonia could give a similar appearance. No effusion or pneumothorax. IMPRESSION: 1. Bibasilar interstitial prominence and ground-glass opacities most consistent with developing pulmonary edema, though viral pneumonia could give a similar pattern of findings. 2. Left ventricular assist device. Electronically Signed   By: Randa Ngo M.D.   On: 05/06/2022 19:28   CT CHEST LUNG CA SCREEN LOW DOSE W/O CM  Result Date: 05/04/2022 CLINICAL DATA:  Former smoker with 45 pack-year history EXAM: CT CHEST WITHOUT CONTRAST LOW-DOSE FOR LUNG CANCER SCREENING TECHNIQUE: Multidetector CT imaging of the chest was performed following the standard protocol without IV contrast. RADIATION DOSE REDUCTION: This exam was performed according to the departmental dose-optimization program which includes automated exposure control, adjustment of the mA and/or kV according to patient size and/or use of iterative reconstruction technique. COMPARISON:  Lung cancer screening CT dated July 31 2020; chest CT dated March 11, 2021 FINDINGS: Cardiovascular: Cardiomegaly. No pericardial effusion. Prior aortic valve replacement severe coronary artery calcifications. LVAD device in place. Normal caliber thoracic aorta with moderate calcified plaque. Mediastinum/Nodes: Esophagus and thyroid are unremarkable. No pathologically enlarged lymph nodes seen in the chest. Lungs/Pleura: Central airways are patent. Moderate centrilobular emphysema. No consolidation, pleural effusion or pneumothorax. New solid pulmonary nodule of the left upper lobe measuring 9 mm on image  64. Upper Abdomen: No acute abnormality. Musculoskeletal: No chest wall mass or suspicious bone lesions identified. IMPRESSION: 1. New solid pulmonary nodule of the left upper lobe measuring  9 mm.Lung-RADS 4B, suspicious. Additional imaging evaluation or consultation with Pulmonology or Thoracic Surgery recommended. 2. Coronary artery calcifications, aortic Atherosclerosis (ICD10-I70.0) and Emphysema (ICD10-J43.9). Electronically Signed   By: Yetta Glassman M.D.   On: 05/04/2022 12:32   ECHOCARDIOGRAM COMPLETE  Result Date: 04/23/2022    ECHOCARDIOGRAM REPORT   Patient Name:   Brandon [name] Date of Exam: 04/23/2022 Medical Rec #:  [id number]      Height:       72.0 in Accession #:    [id number]     Weight:       205.8 lb Date of Birth:  02-21-1956       BSA:          2.157 m Patient Age:    28 years       BP:           110/73 mmHg Patient Gender: M              HR:           100 bpm. Exam Location:  Inpatient Procedure: 2D Echo, Color Doppler and Cardiac Doppler Indications:    Ventricular Tachycardia I47.2  History:        Patient has prior history of Echocardiogram examinations, most                 recent 04/27/2021. CHF, CAD and Previous Myocardial Infarction,                 Defibrillator; Aortic Valve Disease. 04/06/21 Heartmate III                 LVAD.                 Aortic Valve: 25 mm INSPIRIS RESILIA valve is present in the                 aortic position. Procedure Date: 04/06/2021.  Sonographer:    Darlina Sicilian RDCS Referring Phys: [id number] R BENSIMHON  Sonographer Comments: Pump flow 5.1. Pump Speed 5800. PI 3.5. Power 4.8 IMPRESSIONS  1. Study for LVAD     Patient has Heart Mate III.     Initial speek 5800,Flow 5.1 PI 3.5 Power 3.9     Patient is s/p a 25 mm Inspiris Resillia with evidence of aortic valve opening but with poor evaluation of prosthetic parameters.     The intraventricular septum is in the midline.     No mitral regurgitation. No tricuspid regurgitation.     Suboptimal cannular evaluation.. Left ventricular ejection fraction, by estimation, is 20 to 25%. The left ventricle has severely decreased function. The left ventricle demonstrates global hypokinesis. The  left ventricular internal cavity size was mildly dilated. Left ventricular diastolic parameters are indeterminate.  2. Right ventricular systolic function was not well visualized. The right ventricular size is not well visualized.  3. Left atrial size was mildly dilated.  4. Right atrial size was mildly dilated.  5. The mitral valve is normal in structure. No evidence of mitral valve regurgitation. No evidence of mitral stenosis.  6. The aortic valve has been repaired/replaced. Aortic valve regurgitation is not visualized. No aortic stenosis is present. There is a 25 mm INSPIRIS RESILIA valve present in the aortic position. Procedure Date: 04/06/2021. Comparison(s): Prior images reviewed side by side.  No significant effusion. This was not a RAMP study. Similar to prior. FINDINGS  Left Ventricle: Study for LVAD Patient has Heart Mate III. Initial speek 5800,Flow 5.1 PI 3.5 Power 3.9 Patient is s/p a 25 mm Inspiris Resillia with evidence of aortic valve opening but with poor evaluation of prosthetic parameters. The intraventricular septum is in the midline. No mitral regurgitation. No tricuspid regurgitation. Suboptimal cannular evaluation. Left ventricular ejection fraction, by estimation, is 20 to 25%. The left ventricle has severely decreased function. The left ventricle demonstrates global hypokinesis. The left ventricular internal cavity size was mildly dilated. There is no left ventricular hypertrophy. Left ventricular diastolic parameters are indeterminate. Right Ventricle: The right ventricular size is not well visualized. Right vetricular wall thickness was not well visualized. Right ventricular systolic function was not well visualized. Left Atrium: Left atrial size was mildly dilated. Right Atrium: Right atrial size was mildly dilated. Pericardium: There is no evidence of pericardial effusion. Presence of epicardial fat layer. Mitral Valve: The mitral valve is normal in structure. No evidence of mitral  valve regurgitation. No evidence of mitral valve stenosis. Tricuspid Valve: The tricuspid valve is normal in structure. Tricuspid valve regurgitation is not demonstrated. No evidence of tricuspid stenosis. Aortic Valve: The aortic valve has been repaired/replaced. Aortic valve regurgitation is not visualized. No aortic stenosis is present. There is a 25 mm INSPIRIS RESILIA valve present in the aortic position. Procedure Date: 04/06/2021. Pulmonic Valve: The pulmonic valve was not well visualized. Pulmonic valve regurgitation is not visualized. No evidence of pulmonic stenosis. Aorta: The aortic root and ascending aorta are structurally normal, with no evidence of dilitation. IAS/Shunts: No atrial level shunt detected by color flow Doppler.  LEFT VENTRICLE PLAX 2D LVIDd:         5.90 cm   Diastology LVIDs:         5.30 cm   LV e' medial:    6.68 cm/s LV PW:         0.90 cm   LV E/e' medial:  16.5 LV IVS:        1.20 cm   LV e' lateral:   9.90 cm/s LVOT diam:     2.20 cm   LV E/e' lateral: 11.1 LVOT Area:     3.80 cm  RIGHT VENTRICLE TAPSE (M-mode): 1.3 cm LEFT ATRIUM             Index LA Vol (A2C):   74.1 ml 34.36 ml/m LA Vol (A4C):   67.1 ml 31.11 ml/m LA Biplane Vol: 72.1 ml 33.43 ml/m  MITRAL VALVE MV Area (PHT): 5.79 cm     SHUNTS MV Decel Time: 131 msec     Systemic Diam: 2.20 cm MV E velocity: 110.00 cm/s MV A velocity: 100.60 cm/s MV E/A ratio:  1.09 Rudean Haskell MD Electronically signed by Rudean Haskell MD Signature Date/Time: 04/23/2022/4:50:44 PM    Final        I have independently reviewed the above radiology studies  and reviewed the findings with the patient.   Recent Lab Findings: Lab Results  Component Value Date   WBC 8.1 05/08/2022   HGB 12.8 (L) 05/08/2022   HCT 41.2 05/08/2022   PLT 175 05/08/2022   GLUCOSE 109 (H) 05/08/2022   CHOL 122 02/06/2021   TRIG 124 02/06/2021   HDL 37 (L) 02/06/2021   LDLCALC 60 02/06/2021   ALT 20 05/06/2022   AST 35 05/06/2022    NA 135 05/08/2022   K 3.7 05/08/2022  CL 104 05/08/2022   CREATININE 0.96 05/08/2022   BUN 22 05/08/2022   CO2 24 05/08/2022   TSH 0.917 02/06/2021   INR 1.5 (H) 05/08/2022   HGBA1C 6.0 (H) 04/07/2021     Assessment / Plan:   66 year old man presents with STE MI, now resolved. 2 weeks ago presented with VF event.   Cath lab pooling of contrast in aortic root when injected.   CTA with no e/o dissection. Some haziness on my read series 5 images 90-100 around LCA origin.   Would expect some pooling of contrast in an fully supported VAD patient with an aortic valve that is not opening.   Not clear etiology at this juncture regarding  VF, STE events. Could also be thrombus in outflow graft. Would not expect any thrombus in inflow cannula given excellent pump parameters.   Recommend repeat CTA chest (same protocolas prior) before dc to compare appearance of outflow graft.   Pierre Bali Leightyn Cina 05/08/2022 8:24 AM

## 2022-05-08 NOTE — Progress Notes (Addendum)
Advanced Heart Failure VAD Team Note  PCP-Cardiologist: [name], MD   Subjective:    12/25 Presented with CP and anterior ST elevation  R/LHC 12/25: -R/LHC with stable CAD, low filling pressures with normal CO. ? Dissection flap around AVR CTA chest 12/25: No evidence of dissection flap TEE 12/26: EF 20%, normal bioprosthetic AVR with minimal opening, no AI  SWAN #s CVP 7 PA 36/20 CO 7.1 CI 3.3 CO-OX 76%  More dyspneic and congested today. Reports he began experiencing dyspnea, body aches and chills on 12/25. Had been exposed to sister who was recently sick.   LVAD INTERROGATION:  HeartMate III LVAD:   Flow 5.1 liters/min, speed 5800, power 5, PI 3.4.  5 PI events so far this am.  Objective:    Vital Signs:   Temp:  [90.9 F (32.7 C)-100 F (37.8 C)] 98.8 F (37.1 C) (12/27 0700) Pulse Rate:  [72-179] 108 (12/27 0700) Resp:  [11-26] 21 (12/27 0700) BP: (89-93)/(77-82) 89/77 (12/26 1215) SpO2:  [88 %-97 %] 95 % (12/27 0700) Arterial Line BP: (80-132)/(65-115) 112/98 (12/27 0700) Weight:  [91 kg] 91 kg (12/27 0600) Last BM Date :  (PTA) Mean arterial Pressure 70s-80s  Intake/Output:   Intake/Output Summary (Last 24 hours) at 05/08/2022 0752 Last data filed at 05/08/2022 0400 Gross per 24 hour  Intake 472 ml  Output 2400 ml  Net -1928 ml     Physical Exam    Physical Exam: GENERAL: Sitting up in bed. No distress. HEENT: normal  NECK: Supple, JVP 6-7 cm.   CARDIAC:  Mechanical heart sounds with LVAD hum present.  LUNGS:  Clear to auscultation bilaterally.  ABDOMEN:  Soft, round, nontender, positive bowel sounds x4.     LVAD exit site: well-healed and incorporated.  Dressing dry and intact.  Stabilization device present and accurately applied.   EXTREMITIES:  Warm and dry, no cyanosis, clubbing, rash or edema  NEUROLOGIC:  Alert and oriented x 4.  Gait steady.  No aphasia.  No dysarthria.  Affect pleasant.        Telemetry   SR  80s-100s  Labs   Basic Metabolic Panel: Recent Labs  Lab 05/06/22 1902 05/06/22 2023 05/06/22 2106 05/06/22 2107 05/07/22 0438 05/08/22 0534  NA 131* 137 146* 134* 137 135  K 4.9 3.7 2.8* 3.8 3.5 3.7  CL 102  --   --   --  103 104  CO2 21*  --   --   --  25 24  GLUCOSE 124*  --   --   --  102* 109*  BUN 21  --   --   --  18 22  CREATININE 1.03  --   --   --  0.97 0.96  CALCIUM 8.8*  --   --   --  8.6* 8.7*    Liver Function Tests: Recent Labs  Lab 05/06/22 1902  AST 35  ALT 20  ALKPHOS 123  BILITOT 1.7*  PROT 7.0  ALBUMIN 3.6   No results for input(s): "LIPASE", "AMYLASE" in the last 168 hours. No results for input(s): "AMMONIA" in the last 168 hours.  CBC: Recent Labs  Lab 05/06/22 1902 05/06/22 2023 05/06/22 2106 05/06/22 2107 05/07/22 0438 05/08/22 0534  WBC 15.1*  --   --   --  9.3 8.1  NEUTROABS 13.3*  --   --   --   --   --   HGB 13.8 15.3 12.2* 15.3 13.5 12.8*  HCT 43.8 45.0 36.0*  45.0 41.4 41.2  MCV 82.8  --   --   --  80.9 82.9  PLT 186  --   --   --  184 175    INR: Recent Labs  Lab 05/02/22 0000 05/06/22 1902 05/07/22 0438 05/08/22 0534  INR 2.0 2.1* 1.8* 1.5*    Other results: EKG:    Imaging   CT ANGIO CHEST AORTA W/CM & OR WO/CM  Addendum Date: 05/06/2022   ADDENDUM REPORT: 05/06/2022 23:34 ADDENDUM: I discussed these findings with the attending cardiologist and have reviewed several of the intraoperative arteriograms. The reported blush underneath the origin of the left main coronary artery likely reflects contrast within the sinus of Valsalva adjacent to the scaffolding of the artificial aortic valve. There is, however, no dissection flap or extraluminal contrast identified in this region. Electronically Signed   By: Fidela Salisbury M.D.   On: 05/06/2022 23:34   Result Date: 05/06/2022 CLINICAL DATA:  Aortic root dissection EXAM: CT ANGIOGRAPHY CHEST WITH CONTRAST TECHNIQUE: Multidetector CT imaging of the chest was performed  using the standard protocol during bolus administration of intravenous contrast. Multiplanar CT image reconstructions and MIPs were obtained to evaluate the vascular anatomy. RADIATION DOSE REDUCTION: This exam was performed according to the departmental dose-optimization program which includes automated exposure control, adjustment of the mA and/or kV according to patient size and/or use of iterative reconstruction technique. CONTRAST:  42mL OMNIPAQUE IOHEXOL 350 MG/ML SOLN COMPARISON:  11/21/2020, 04/30/2022 FINDINGS: Cardiovascular: Ventriassist left ventricular assist device is in place. There is thrombosis of the inflow and proximal outflow cannulas of the device, best seen on axial image # 132 and # 136, series 5. Opacification of the terminal outflow cannula may be retrograde in fashion from the ascending aorta. Global cardiac size is within normal limits with left ventricular dilation and marked thinning of the left ventricular apex and septal walls again noted. Extensive multi-vessel coronary artery calcification. No pericardial effusion. Aortic valve replacement and tube graft repair of the ascending thoracic aorta has been performed. No thoracic aortic intramural hematoma, dissection, or aneurysm. Moderate atherosclerotic calcification within the descending thoracic aorta. Arch vasculature demonstrates variant anatomy with separate origin of the left vertebral artery directly from the aorta which appears diminutive in nature. Arch vasculature is otherwise unremarkable. Left internal jugular Swan-Ganz catheter tip noted within the origin of the right lower lobar pulmonary artery. Mediastinum/Nodes: Visualize thyroid is unremarkable. No pathologic thoracic adenopathy. Esophagus unremarkable. No mediastinal hematoma or pneumomediastinum. Lungs/Pleura: Mild emphysema. Multifocal airspace infiltrates are noted within the dependent right upper lobe and lower lobes bilaterally which may represent asymmetric  alveolar pulmonary edema or superimposed acute pneumonic infiltrate. There is interlobular septal thickening in keeping with at least mild interstitial pulmonary edema, likely cardiogenic in nature. Small right and trace left pleural effusions are present. No pneumothorax. Upper Abdomen: Cholelithiasis without pericholecystic inflammatory change. No acute abnormality. Musculoskeletal: Right chest wall nerve stimulator device is seen with its single lead extending toward the right carotid body. No acute bone abnormality. No lytic or blastic bone lesion. Review of the MIP images confirms the above findings. IMPRESSION: 1. Status post aortic valve replacement and tube graft repair of the ascending aorta. No evidence of aortic intramural hematoma, dissection, or aneurysm. 2. Ventriassist left ventricular assist device in place. Suspected thrombosis of the inflow and proximal outflow cannulas of the device. Opacification of the terminal outflow cannula may be retrograde in fashion from the ascending aorta. 3. Extensive multi-vessel coronary artery calcification. Marked thinning  of the a left ventricular apex and septal wall in keeping with prior myocardial infarction. 4. Mild emphysema. 5. Multifocal airspace infiltrates within the dependent right upper lobe and lower lobes bilaterally which may represent asymmetric alveolar pulmonary edema or superimposed acute pneumonic infiltrate. 6. Mild interstitial pulmonary edema, likely cardiogenic in nature. Small right and trace left pleural effusions. 7. Cholelithiasis. 8. Swan-Ganz catheter tip within the right lower lobar pulmonary artery origin. Withdrawal of the catheter by 3-4 cm may more optimally position the device. Aortic Atherosclerosis (ICD10-I70.0) and Emphysema (ICD10-J43.9). Electronically Signed: By: Fidela Salisbury M.D. On: 05/06/2022 22:55   CARDIAC CATHETERIZATION  Result Date: 05/06/2022   Prox Cx lesion is 50% stenosed.   Prox RCA to Mid RCA lesion is  100% stenosed.   Ost RCA to Prox RCA lesion is 70% stenosed.   2nd Diag lesion is 90% stenosed.   Dist LAD lesion is 50% stenosed.   Non-stenotic Mid LAD lesion was previously treated. Findings: Ao = 93/83 (88) RA = 1 RV = 34/5 PA = 28/18 (22) PCW = 5 Fick cardiac output/index = 4.8/2.2 TD CO/CI = 6.0/2.8 PVR = 3.0 WU Ao sat = 92% PA sat = 60% Assessment: 1. Stable CAD with evidence of pre-existing aortic root dissection around AVR 2. Low filling pressures with normal cardiac output Plan/Discussion: Stat CTA chest to evaluate dissection. Discussed with Dr. Tenny Craw in [place]. Glori Bickers, MD 9:51 PM    DG Chest Portable 1 View  Result Date: 05/06/2022 CLINICAL DATA:  Chest pain, left ventricular assist device EXAM: PORTABLE CHEST 1 VIEW COMPARISON:  05/30/2021, 04/30/2022 FINDINGS: Two frontal views of the chest are obtained. Postsurgical changes from median sternotomy and aortic valve replacement. Left ventricular assist device again noted. Portions of the left lung base are obscured by overlying cardiac leads and monitoring device. Nerve stimulator overlies the right chest, lead extending to the right submandibular region. Cardiac silhouette is enlarged. There is increased central vascular congestion with developing bibasilar interstitial and ground-glass opacities most consistent with edema, though viral pneumonia could give a similar appearance. No effusion or pneumothorax. IMPRESSION: 1. Bibasilar interstitial prominence and ground-glass opacities most consistent with developing pulmonary edema, though viral pneumonia could give a similar pattern of findings. 2. Left ventricular assist device. Electronically Signed   By: Randa Ngo M.D.   On: 05/06/2022 19:28     Medications:     Scheduled Medications:  atorvastatin  80 mg Oral QHS   Chlorhexidine Gluconate Cloth  6 each Topical Daily   ezetimibe  10 mg Oral Daily   melatonin  5 mg Oral QHS   pantoprazole  40 mg Oral BID   sertraline  100  mg Oral Daily   sodium chloride flush  3 mL Intravenous Q12H   tamsulosin  0.4 mg Oral Daily   torsemide  20 mg Oral Daily   Warfarin - Pharmacist Dosing Inpatient   Does not apply q1600    Infusions:  sodium chloride     sodium chloride 10 mL/hr at 05/08/22 0400   sodium chloride     heparin 800 Units/hr (05/08/22 0400)   nitroGLYCERIN Stopped (05/06/22 2132)    PRN Medications: sodium chloride, acetaminophen, dextromethorphan-guaiFENesin, ondansetron (ZOFRAN) IV, sodium chloride flush   Assessment/Plan:     1. Anterior STEMI - Recent VF requiring emergent DCCV - HS troponin > 24K - R/LHC - stable CAD, low filling pressures, normal CO. ?evidence of pre-existing aortic root dissection around AVR - CTA chest - no dissection  flap or evidence of pump thrombosis - TEE 12/26: EF 20%, normal aortic valve prosthesis, minimal opening, no AI - ? Consider low-dose beta blocker prior to discharge.   2. Acute on Chronic systolic HFr EF due to iCM in setting of STEMI - Echo 05/19/20 EF 20-25% RV mildly HK. moderate AS  Mean gradient 13 AVA 1.2 cm2 DI 0.30 - s/p HM-III VAD + bioprosthetic AVR 04/06/21 - Baseline NYHA I.  - CVP 7. Complaining of more dyspnea, but does not appear significantly overloaded on exam. Continue Torsemide 20 mg daily.  - Increased congestion and body aches today. Low grade temp overnight.  ? Viral illness. Sister recently sick. Viral respiratory panel ordered. - Check CXR - Holding home losartan and eplerenone   3. HM-3 LVAD implant - VAD interrogated personally. See HPI - INR 1.5. Warfarin restarted 12/26. Dosing discussed with PharmD.  - Continue heparin gtt. - LDH 273>240   4. Severe low-flow aortic stenosis s/p AVR - s/p VAD/bioprosthetic AVR on 11/25   5. CAD  - s/p anterior STEMI (12/20). LHC showed chronically occluded RCA (with L>>R collaterals) and thrombotic occlusion of proximal LAD. Underwent PCI of LAD. - Now s/p VAD - Continue statin - see  #1  6. VF - VF 12/23 s/p emergent DC-CV - Discussed with EP, would need ICD if has recurrent VT/VF   7. Paroxysmal Atrial Fibrillation  - Maintaining SR - Off amio   8. 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  - Bcx recollected and pending  - Tissue sent to [facility] in [place] for broad 16 S ribosomal sequencing for bacterial pathogens as well as T wet Bhilai Bartonella Brucella  - Ceftriaxone 2 g IV daily x 6 weeks. Completed May 22, 2021.  - ICD removed - No infectious sx. WBC ok    9. Ankylosing spondylitis - His local rheumatologist is hesitant to start remicade with VAD/infection risk - D/w [name] VAD program they have recommended f/u with Veva Holes, MD for 2nd opinion if needed.  - Currently feeling ok without it - PCP managing pain meds    Checking viral respiratory panel today. Continue PO Torsemide.   Can likely pull SWAN soon.   I reviewed the LVAD parameters from today, and compared the results to the patient's prior recorded data.  No programming changes were made.  The LVAD is functioning within specified parameters.  The patient performs LVAD self-test daily.  LVAD interrogation was negative for any significant power changes, alarms or PI events/speed drops.  LVAD equipment check completed and is in good working order.  Back-up equipment present.   LVAD education done on emergency procedures and precautions and reviewed exit site care.  Length of Stay: 2  FINCH, Lynder Parents, PA-C 05/08/2022, 7:52 AM  VAD Team --- VAD ISSUES ONLY--- Pager [phone] (7am - 7am)  Advanced Heart Failure Team  Pager [phone] (M-F; 7a - 5p)  Please contact [facility] for night-coverage after hours (5p -7a ) and weekends on [email or web address]  Agree with above.   Has URI symptoms today. Found to be COVID +.   Swan numbers stable. No further CP. No VT   PAP: (24-61)/(7-44) 40/26 CVP:  [2  mmHg-20 mmHg] 6 mmHg PCWP:  [24 mmHg] 24 mmHg CO:  [7.1 L/min-11.3 L/min] 7.1 L/min CI:  [3.3 L/min/m2-5.3 L/min/m2] 3.3 L/min/m2  General:  NAD. Congested HEENT: normal  Neck: supple. JVP not elevated.  Carotids 2+ bilat; no bruits. No lymphadenopathy or thryomegaly appreciated. Cor: LVAD hum.  Lungs: Clear. Abdomen: obese soft, nontender, non-distended. No hepatosplenomegaly. No bruits or masses. Good bowel sounds. Driveline site clean. Anchor in place.  Extremities: no cyanosis, clubbing, rash. Warm no edema  Neuro: alert & oriented x 3. No focal deficits. Moves all 4 without problem   Hemodynamics stable. Shock resolved. CT images reviewed with Dr. PVT and concern for possible penetrating aortic root ulcer.   Increase heparin. Continue warfarin.  Pull swan and remove arterial line.   Will discuss Covid Rx with PharmD.  CRITICAL CARE Performed by: Glori Bickers  Total critical care time: 35 minutes  Critical care time was exclusive of separately billable procedures and treating other patients.  Critical care was necessary to treat or prevent imminent or life-threatening deterioration.  Critical care was time spent personally by me (independent of midlevel providers or residents) on the following activities: development of treatment plan with patient and/or surrogate as well as nursing, discussions with consultants, evaluation of patient's response to treatment, examination of patient, obtaining history from patient or surrogate, ordering and performing treatments and interventions, ordering and review of laboratory studies, ordering and review of radiographic studies, pulse oximetry and re-evaluation of patient's condition.  Glori Bickers, MD  2:13 PM

## 2022-05-08 NOTE — Progress Notes (Signed)
ANTICOAGULATION CONSULT NOTE   Pharmacy Consult for heparin > warfarin Indication: LVAD HM3  No Known Allergies  Patient Measurements: Height: 6' (182.9 cm) Weight: 91 kg (200 lb 9.9 oz) IBW/kg (Calculated) : 77.6   Vital Signs: Temp: 99 F (37.2 C) (12/27 1200) Temp Source: Core (12/27 1200) Pulse Rate: 97 (12/27 1200)  Labs: Recent Labs    05/06/22 1902 05/06/22 2023 05/06/22 2107 05/06/22 2305 05/07/22 0438 05/07/22 2028 05/08/22 0534 05/08/22 1435  HGB 13.8   < > 15.3  --  13.5  --  12.8*  --   HCT 43.8   < > 45.0  --  41.4  --  41.2  --   PLT 186  --   --   --  184  --  175  --   LABPROT 23.7*  --   --   --  21.0*  --  18.0*  --   INR 2.1*  --   --   --  1.8*  --  1.5*  --   HEPARINUNFRC  --   --   --   --   --  <0.10* <0.10* <0.10*  CREATININE 1.03  --   --   --  0.97  --  0.96  --   TROPONINIHS 48*  --   --  8,627* >24,000*  --   --   --    < > = values in this interval not displayed.     Estimated Creatinine Clearance: 80.8 mL/min (by C-G formula based on SCr of 0.96 mg/dL).   Medical History: Past Medical History:  Diagnosis Date   AICD (automatic cardioverter/defibrillator) present 08/31/2019   Ankylosing spondylitis ([place])    Arthritis    BENIGN PROSTATIC HYPERTROPHY 06/07/2008   CHF (congestive heart failure) ([place])    COLONIC POLYPS, HX OF 06/07/2008   Coronary artery disease    Depression    H/O hiatal hernia    Heart failure ([place])    HYPERLIPIDEMIA 06/07/2008   HYPERTENSION 06/07/2008   Myocardial infarction ([place]) 2005   NSTEMI, s/p LAD stent   NEPHROLITHIASIS, HX OF 06/07/2008   STEMI (ST elevation myocardial infarction) ([place]) 04/27/2019     Assessment: 66yom with Hx endocarditis, bAVR and LVAD 11/22 admitted with CP > cath no acute infarct, TEE negative for vegetation. INR 2.1 on admit.   Pharmacy dosing heparin and warfarin. Heparin level this afternoon came back <0.1, on 950 units/hr. No s/sx of bleeding or infusion issues. INR  1.5 - received warfarin 7.5 mg this evening.   PTA warfarin 5mg  TTSa.2.5mg  MWFSu  Goal of Therapy:  INR goal 2.5-3 New Goal Heparin level 0.3  Monitor platelets by anticoagulation protocol: Yes   Plan:  -Increase heparin to 1100 units/hr -Will order heparin level with AM labs  -Daily heparin level, protime and CBC  Antonietta Jewel, PharmD, [place] Pharmacist  Phone: [phone] 05/08/2022 3:41 PM  Please check AMION for all [place] phone numbers After 10:00 PM, call [place] [phone]

## 2022-05-08 NOTE — Plan of Care (Signed)
  Problem: Education: Goal: Knowledge of General Education information will improve Description: Including pain rating scale, medication(s)/side effects and non-pharmacologic comfort measures Outcome: Progressing   Problem: Health Behavior/Discharge Planning: Goal: Ability to manage health-related needs will improve Outcome: Progressing   Problem: Clinical Measurements: Goal: Ability to maintain clinical measurements within normal limits will improve Outcome: Progressing Goal: Will remain free from infection Outcome: Progressing Goal: Diagnostic test results will improve Outcome: Progressing Goal: Respiratory complications will improve Outcome: Progressing Goal: Cardiovascular complication will be avoided Outcome: Progressing   Problem: Activity: Goal: Risk for activity intolerance will decrease Outcome: Progressing   Problem: Nutrition: Goal: Adequate nutrition will be maintained Outcome: Progressing   Problem: Coping: Goal: Level of anxiety will decrease Outcome: Progressing   Problem: Elimination: Goal: Will not experience complications related to bowel motility Outcome: Progressing Goal: Will not experience complications related to urinary retention Outcome: Progressing   Problem: Pain Managment: Goal: General experience of comfort will improve Outcome: Progressing   Problem: Safety: Goal: Ability to remain free from injury will improve Outcome: Progressing   Problem: Skin Integrity: Goal: Risk for impaired skin integrity will decrease Outcome: Progressing   Problem: Education: Goal: Patient will understand all VAD equipment and how it functions Outcome: Progressing Goal: Patient will be able to verbalize current INR target range and antiplatelet therapy for discharge home Outcome: Progressing   Problem: Cardiac: Goal: LVAD will function as expected and patient will experience no clinical alarms Outcome: Progressing   Problem: Education: Goal:  Understanding of CV disease, CV risk reduction, and recovery process will improve Outcome: Progressing Goal: Individualized Educational Video(s) Outcome: Progressing   Problem: Activity: Goal: Ability to return to baseline activity level will improve Outcome: Progressing   Problem: Cardiovascular: Goal: Ability to achieve and maintain adequate cardiovascular perfusion will improve Outcome: Progressing   Problem: Health Behavior/Discharge Planning: Goal: Ability to safely manage health-related needs after discharge will improve Outcome: Progressing   Problem: Education: Goal: Knowledge of risk factors and measures for prevention of condition will improve Outcome: Progressing   Problem: Coping: Goal: Psychosocial and spiritual needs will be supported Outcome: Progressing   Problem: Respiratory: Goal: Will maintain a patent airway Outcome: Progressing Goal: Complications related to the disease process, condition or treatment will be avoided or minimized Outcome: Progressing

## 2022-05-08 NOTE — Progress Notes (Addendum)
ANTICOAGULATION CONSULT NOTE   Pharmacy Consult for heparin > warfarin Indication: LVAD HM3  No Known Allergies  Patient Measurements: Height: 6' (182.9 cm) Weight: 91 kg (200 lb 9.9 oz) IBW/kg (Calculated) : 77.6   Vital Signs: Temp: 98.8 F (37.1 C) (12/27 0700) Pulse Rate: 108 (12/27 0700)  Labs: Recent Labs    05/06/22 1902 05/06/22 2023 05/06/22 2107 05/06/22 2305 05/07/22 0438 05/07/22 2028 05/08/22 0534  HGB 13.8   < > 15.3  --  13.5  --  12.8*  HCT 43.8   < > 45.0  --  41.4  --  41.2  PLT 186  --   --   --  184  --  175  LABPROT 23.7*  --   --   --  21.0*  --  18.0*  INR 2.1*  --   --   --  1.8*  --  1.5*  HEPARINUNFRC  --   --   --   --   --  <0.10* <0.10*  CREATININE 1.03  --   --   --  0.97  --  0.96  TROPONINIHS 48*  --   --  8,627* >24,000*  --   --    < > = values in this interval not displayed.     Estimated Creatinine Clearance: 80.8 mL/min (by C-G formula based on SCr of 0.96 mg/dL).   Medical History: Past Medical History:  Diagnosis Date   AICD (automatic cardioverter/defibrillator) present 08/31/2019   Ankylosing spondylitis ([place])    Arthritis    BENIGN PROSTATIC HYPERTROPHY 06/07/2008   CHF (congestive heart failure) ([place])    COLONIC POLYPS, HX OF 06/07/2008   Coronary artery disease    Depression    H/O hiatal hernia    Heart failure ([place])    HYPERLIPIDEMIA 06/07/2008   HYPERTENSION 06/07/2008   Myocardial infarction ([place]) 2005   NSTEMI, s/p LAD stent   NEPHROLITHIASIS, HX OF 06/07/2008   STEMI (ST elevation myocardial infarction) ([place]) 04/27/2019     Assessment: Brandon [name] with Hx endocarditis, bAVR and LVAD 11/22 admitted with CP > cath no acute infarct, TEE negative for vegetation. INR 2.1 on admit.   Pharmacy dosing heparin and warfarin. Heparin drip rate 800 uts/hr with heparin level < 0.1 and INR 1.5 after missing dose on admit  Cbc stable no bleeding    PTA warfarin 5mg  TTSa.2.5mg  MWFSu  Goal of Therapy:  INR goal  2.5-3 New Goal Heparin level 0.3  Monitor platelets by anticoagulation protocol: Yes   Plan:  -Increase heparin to 950 units/hr Warfarin boost 7.5mg  x1 today -Daily heparin level, protime and CBC    Bonnita Nasuti Pharm.D. CPP, BCPS Clinical Pharmacist [phone] 05/08/2022 8:24 AM

## 2022-05-08 NOTE — Progress Notes (Signed)
CT and cath images reviewed with Dr. Darcey Nora. Concern for possible penetrating ulcer at aortic root/origin LM.   Recommend cardiac CT this admission to further assess.  Will order to be completed tomorrow am.

## 2022-05-09 ENCOUNTER — Inpatient Hospital Stay (HOSPITAL_COMMUNITY): Payer: Medicare Other

## 2022-05-09 DIAGNOSIS — I2510 Atherosclerotic heart disease of native coronary artery without angina pectoris: Secondary | ICD-10-CM

## 2022-05-09 DIAGNOSIS — I2101 ST elevation (STEMI) myocardial infarction involving left main coronary artery: Secondary | ICD-10-CM

## 2022-05-09 DIAGNOSIS — I2109 ST elevation (STEMI) myocardial infarction involving other coronary artery of anterior wall: Secondary | ICD-10-CM | POA: Diagnosis not present

## 2022-05-09 DIAGNOSIS — Z95811 Presence of heart assist device: Secondary | ICD-10-CM | POA: Diagnosis not present

## 2022-05-09 LAB — CBC
HCT: 41.4 % (ref 39.0–52.0)
Hemoglobin: 13.2 g/dL (ref 13.0–17.0)
MCH: 26.5 pg (ref 26.0–34.0)
MCHC: 31.9 g/dL (ref 30.0–36.0)
MCV: 83 fL (ref 80.0–100.0)
Platelets: 172 10*3/uL (ref 150–400)
RBC: 4.99 MIL/uL (ref 4.22–5.81)
RDW: 16.4 % — ABNORMAL HIGH (ref 11.5–15.5)
WBC: 6.1 10*3/uL (ref 4.0–10.5)
nRBC: 0 % (ref 0.0–0.2)

## 2022-05-09 LAB — HEPARIN LEVEL (UNFRACTIONATED)
Heparin Unfractionated: 0.1 IU/mL — ABNORMAL LOW (ref 0.30–0.70)
Heparin Unfractionated: <0.1 IU/mL — ABNORMAL LOW (ref 0.30–0.70)

## 2022-05-09 LAB — BASIC METABOLIC PANEL
Anion gap: 9 (ref 5–15)
BUN: 20 mg/dL (ref 8–23)
CO2: 23 mmol/L (ref 22–32)
Calcium: 8.7 mg/dL — ABNORMAL LOW (ref 8.9–10.3)
Chloride: 105 mmol/L (ref 98–111)
Creatinine, Ser: 0.94 mg/dL (ref 0.61–1.24)
GFR, Estimated: >60 mL/min (ref >60)
Glucose, Bld: 101 mg/dL — ABNORMAL HIGH (ref 70–99)
Potassium: 3.5 mmol/L (ref 3.5–5.1)
Sodium: 137 mmol/L (ref 135–145)

## 2022-05-09 LAB — PROTIME-INR
INR: 1.5 — ABNORMAL HIGH (ref 0.8–1.2)
Prothrombin Time: 18.4 seconds — ABNORMAL HIGH (ref 11.4–15.2)

## 2022-05-09 LAB — MAGNESIUM: Magnesium: 2 mg/dL (ref 1.7–2.4)

## 2022-05-09 LAB — LACTATE DEHYDROGENASE: LDH: 243 U/L — ABNORMAL HIGH (ref 98–192)

## 2022-05-09 MED ORDER — POTASSIUM CHLORIDE CRYS ER 20 MEQ PO TBCR
40.0000 meq | EXTENDED_RELEASE_TABLET | Freq: Every day | ORAL | Status: DC
  Administered 2022-05-09 – 2022-05-10 (×2): 40 meq via ORAL
  Filled 2022-05-09 (×2): qty 2

## 2022-05-09 MED ORDER — IOHEXOL 350 MG/ML SOLN
100.0000 mL | Freq: Once | INTRAVENOUS | Status: AC | PRN
  Administered 2022-05-09: 100 mL via INTRAVENOUS

## 2022-05-09 MED ORDER — LOSARTAN POTASSIUM 25 MG PO TABS
12.5000 mg | ORAL_TABLET | Freq: Every day | ORAL | Status: DC
  Administered 2022-05-09 – 2022-05-12 (×4): 12.5 mg via ORAL
  Filled 2022-05-09 (×4): qty 1

## 2022-05-09 MED ORDER — METOPROLOL SUCCINATE ER 25 MG PO TB24
12.5000 mg | ORAL_TABLET | Freq: Every day | ORAL | Status: DC
  Administered 2022-05-10 – 2022-05-12 (×3): 12.5 mg via ORAL
  Filled 2022-05-09 (×3): qty 1

## 2022-05-09 MED ORDER — WARFARIN SODIUM 7.5 MG PO TABS
7.5000 mg | ORAL_TABLET | Freq: Once | ORAL | Status: AC
  Administered 2022-05-09: 7.5 mg via ORAL
  Filled 2022-05-09: qty 1

## 2022-05-09 NOTE — Progress Notes (Signed)
LVAD Coordinator Rounding Note:  Admitted overnight due to complaints of crushing chest pain, vomiting and SOB. Pt found to have ST elevation and was taken directly to the cath lab for [facility].  R/LHC 12/25: - R/LHC - stable CAD, low filling pressures, normal CO. ?evidence of pre-existing aortic root dissection around AVR   HM3 LVAD implanted on 04/06/21 by Dr. Cyndia Bent under DT criteria.  TEE 12/26: LV EF 20%, RV WNL. See DB note 12/26 for further details.   Pt laying in bed upon my arrival. Respiratory panel 12/27 + COVID. Denies shortness of breath or chest pain. Reports body aches and chills have improved. Feeling fatigued.   CT and cardiac cath images reviewed with Dr. Prescott Gum. Concern for possible penetrating ulcer at aortic root/origin LM. For cardiac CT today.   Vital signs: HR: 93 Doppler Pressure: 90 Automatic BP: 107/75 (86) O2 Sat: 95% on RA Wt: 200.8> 200.6> lbs   LVAD interrogation reveals:  Speed: 5800 Flow: 5.1 Power:  4.8 w PI: 3.3 Alarms: none Events:  >80 yesterday; 12 so far today Hematocrit: 41  Fixed speed: 5800 Low speed limit: 5500  Drive Line: Existing VAD dressing clean, dry, and intact. Anchor correctly applied. Next dressing change due 05/14/21 by VAD Coordinator, caregiver or bedside nurse.   Labs:  LDH trend: 273>240>243  INR trend: 1.8>1.5>1.5  Troponin: 48>8,627>24,000  WBC trend: 15.1>9.3>8.1>6.1  Anticoagulation Plan: -INR Goal: 2.0-2.5 -ASA Dose: restarted 81 mg daily this admission  Blood Products:   Device:N/A  Arrythmias: none this admission  -DCCV 04/22/22 VFIB   Plan/Recommendations:  1. [place] VAD Coordinator with any equipment of VAD concerns 2. Weekly drive line dressing change per beside RN  Emerson Monte RN [place] Coordinator  Office: [phone]  24/7 Pager: [phone]

## 2022-05-09 NOTE — Progress Notes (Signed)
ANTICOAGULATION CONSULT NOTE   Pharmacy Consult for heparin > warfarin Indication: LVAD HM3  No Known Allergies  Patient Measurements: Height: 6' (182.9 cm) Weight: 91 kg (200 lb 9.9 oz) IBW/kg (Calculated) : 77.6   Vital Signs: Temp: 97.6 F (36.4 C) (12/28 0500) Temp Source: Oral (12/28 0500) BP: 116/100 (12/28 0500) Pulse Rate: 62 (12/28 0500)  Labs: Recent Labs    05/06/22 1902 05/06/22 2023 05/06/22 2305 05/07/22 0438 05/07/22 2028 05/08/22 0534 05/08/22 1435 05/09/22 0039  HGB 13.8   < >  --  13.5  --  12.8*  --  13.2  HCT 43.8   < >  --  41.4  --  41.2  --  41.4  PLT 186  --   --  184  --  175  --  172  LABPROT 23.7*  --   --  21.0*  --  18.0*  --  18.4*  INR 2.1*  --   --  1.8*  --  1.5*  --  1.5*  HEPARINUNFRC  --   --   --   --    < > <0.10* <0.10* 0.10*  CREATININE 1.03  --   --  0.97  --  0.96  --  0.94  TROPONINIHS 48*  --  8,627* >24,000*  --   --   --   --    < > = values in this interval not displayed.     Estimated Creatinine Clearance: 82.6 mL/min (by C-G formula based on SCr of 0.94 mg/dL).   Medical History: Past Medical History:  Diagnosis Date   AICD (automatic cardioverter/defibrillator) present 08/31/2019   Ankylosing spondylitis ([place])    Arthritis    BENIGN PROSTATIC HYPERTROPHY 06/07/2008   CHF (congestive heart failure) ([place])    COLONIC POLYPS, HX OF 06/07/2008   Coronary artery disease    Depression    H/O hiatal hernia    Heart failure ([place])    HYPERLIPIDEMIA 06/07/2008   HYPERTENSION 06/07/2008   Myocardial infarction ([place]) 2005   NSTEMI, s/p LAD stent   NEPHROLITHIASIS, HX OF 06/07/2008   STEMI (ST elevation myocardial infarction) ([place]) 04/27/2019     Assessment: 66yom with Hx endocarditis, bAVR and LVAD 11/22 admitted with CP > cath no acute infarct, TEE negative for vegetation. INR 2.1 on admit.   Pharmacy dosing heparin and warfarin. Heparin drip rate increased to 1100 uts/hr last pm to achieve heparin level  closer to 0.3 - increase cautiously with LVAD / bleeding risk  Heparin level this am 0.1 and INR 1.5 after missing dose on admit - will boost warfarin dose - now with increased INR goal with possible thrombus Cbc stable no bleeding    PTA warfarin 5mg  TTSa.2.5mg  MWFSu  Goal of Therapy:  INR goal 2.5-3 New Goal Heparin level 0.3  Monitor platelets by anticoagulation protocol: Yes   Plan:  -Increase heparin to 1150 units/hr Warfarin boost 7.5mg  x1 today -Daily heparin level, protime and CBC    Bonnita Nasuti Pharm.D. CPP, BCPS Clinical Pharmacist [phone] 05/09/2022 7:51 AM

## 2022-05-09 NOTE — Progress Notes (Addendum)
Advanced Heart Failure VAD Team Note  PCP-Cardiologist: [name], MD   Subjective:    12/25 Presented with CP and anterior ST elevation 12/25: R/LHC with stable CAD, low filling pressures with normal CO. ? Dissection flap around AVR CTA chest 12/25: No evidence of dissection flap TEE 12/26: EF 20%, normal bioprosthetic AVR with minimal opening, no AI  Central access pulled 12/27.  CT and cath images reviewed with Dr. Darcey Nora. Concern for possible penetrating ulcer at aortic root/origin LM. Cardiac CT today.   Covid + 12/27, had congestion, dyspnea, body aches and chills with recent exposure.   Feels much better this morning, no SOB, body aches or chills.   LVAD INTERROGATION:  HeartMate III LVAD:   Flow 5.1 liters/min, speed 5800, power 4.8, PI 3.5.  9 PI events so far this am.  Objective:    Vital Signs:   Temp:  [97.6 F (36.4 C)-99 F (37.2 C)] 97.6 F (36.4 C) (12/28 0500) Pulse Rate:  [62-110] 62 (12/28 0500) Resp:  [14-20] 14 (12/28 0500) BP: (100-116)/(90-100) 116/100 (12/28 0500) SpO2:  [92 %-95 %] 94 % (12/28 0500) Arterial Line BP: (68-89)/(54-75) 68/54 (12/27 1100) Last BM Date :  (PTA) Mean arterial Pressure 80s-90s  Intake/Output:   Intake/Output Summary (Last 24 hours) at 05/09/2022 0728 Last data filed at 05/09/2022 0500 Gross per 24 hour  Intake 1156.91 ml  Output 1525 ml  Net -368.09 ml     Physical Exam    General:  Well appearing. No resp difficulty HEENT: Normal Neck: supple. No JVP. Carotids 2+ bilat; no bruits. No lymphadenopathy or thyromegaly appreciated. Cor: Mechanical heart sounds with LVAD hum present. Lungs: Clear Abdomen: soft, nontender, nondistended. No hepatosplenomegaly. No bruits or masses. Good bowel sounds. Driveline: C/D/I; securement device intact and driveline incorporated Extremities: no cyanosis, clubbing, rash, edema Neuro: alert & orientedx3, cranial nerves grossly intact. moves all 4 extremities w/o  difficulty. Affect pleasant   Telemetry   SR 90s 1-3 PVCs/hr (Personally reviewed)    Labs   Basic Metabolic Panel: Recent Labs  Lab 05/06/22 1902 05/06/22 2023 05/06/22 2106 05/06/22 2107 05/07/22 0438 05/08/22 0534 05/09/22 0039  NA 131*   < > 146* 134* 137 135 137  K 4.9   < > 2.8* 3.8 3.5 3.7 3.5  CL 102  --   --   --  103 104 105  CO2 21*  --   --   --  25 24 23   GLUCOSE 124*  --   --   --  102* 109* 101*  BUN 21  --   --   --  18 22 20   CREATININE 1.03  --   --   --  0.97 0.96 0.94  CALCIUM 8.8*  --   --   --  8.6* 8.7* 8.7*  MG  --   --   --   --   --  2.0 2.0   < > = values in this interval not displayed.    Liver Function Tests: Recent Labs  Lab 05/06/22 1902  AST 35  ALT 20  ALKPHOS 123  BILITOT 1.7*  PROT 7.0  ALBUMIN 3.6   No results for input(s): "LIPASE", "AMYLASE" in the last 168 hours. No results for input(s): "AMMONIA" in the last 168 hours.  CBC: Recent Labs  Lab 05/06/22 1902 05/06/22 2023 05/06/22 2106 05/06/22 2107 05/07/22 0438 05/08/22 0534 05/09/22 0039  WBC 15.1*  --   --   --  9.3 8.1 6.1  NEUTROABS 13.3*  --   --   --   --   --   --   HGB 13.8   < > 12.2* 15.3 13.5 12.8* 13.2  HCT 43.8   < > 36.0* 45.0 41.4 41.2 41.4  MCV 82.8  --   --   --  80.9 82.9 83.0  PLT 186  --   --   --  184 175 172   < > = values in this interval not displayed.    INR: Recent Labs  Lab 05/06/22 1902 05/07/22 0438 05/08/22 0534 05/09/22 0039  INR 2.1* 1.8* 1.5* 1.5*    Other results:    Imaging   DG Chest Port 1 View  Result Date: 05/08/2022 CLINICAL DATA:  Shortness of breath EXAM: PORTABLE CHEST 1 VIEW COMPARISON:  05/06/2022 FINDINGS: Left ventricular assist device remains in place. New right internal jugular approach pulmonary arterial catheter terminates within the right proximal pulmonary outflow. Stable heart size status post sternotomy and cardiac valve replacement. Aortic atherosclerosis. Diffuse bilateral interstitial  prominence with slightly improved aeration of the lungs compared to prior. No large pleural fluid collection. No pneumothorax. Nerve stimulator pack overlies the right chest. IMPRESSION: 1. Diffuse bilateral interstitial prominence with slightly improved aeration of the lungs compared to prior. 2. New right internal jugular approach pulmonary arterial catheter terminates within the right proximal pulmonary outflow. No pneumothorax. Electronically Signed   By: Davina Poke D.O.   On: 05/08/2022 09:27     Medications:     Scheduled Medications:  aspirin EC  81 mg Oral Daily   atorvastatin  80 mg Oral QHS   ezetimibe  10 mg Oral Daily   guaiFENesin  600 mg Oral BID   melatonin  5 mg Oral QHS   pantoprazole  40 mg Oral BID   potassium chloride  20 mEq Oral Daily   sertraline  100 mg Oral Daily   sodium chloride flush  3 mL Intravenous Q12H   tamsulosin  0.4 mg Oral Daily   torsemide  20 mg Oral Daily   Warfarin - Pharmacist Dosing Inpatient   Does not apply q1600    Infusions:  sodium chloride Stopped (05/08/22 1201)   sodium chloride     heparin 1,100 Units/hr (05/09/22 0300)    PRN Medications: sodium chloride, acetaminophen, dextromethorphan-guaiFENesin, metoprolol tartrate, ondansetron (ZOFRAN) IV, mouth rinse, sodium chloride, sodium chloride flush   Assessment/Plan:   1. Anterior STEMI - Recent VF requiring emergent DCCV - HS troponin > 24K - R/LHC - stable CAD, low filling pressures, normal CO. ?evidence of pre-existing aortic root dissection around AVR - CTA chest - no dissection flap or evidence of pump thrombosis - TEE 12/26: EF 20%, normal aortic valve prosthesis, minimal opening, no AI - ? Consider low-dose beta blocker prior to discharge. - CT and cath images reviewed with Dr. Darcey Nora. Concern for possible penetrating ulcer at aortic root/origin LM. Cardiac CT today.   2. Acute on Chronic systolic HFr EF due to iCM in setting of STEMI - Echo 05/19/20 EF 20-25% RV  mildly HK. moderate AS  Mean gradient 13 AVA 1.2 cm2 DI 0.30 - s/p HM-III VAD + bioprosthetic AVR 04/06/21 - Baseline NYHA I.  - Volume stable on assessment  - Holding home eplerenone - Restart home losartan 12.5 daily   3. HM-3 LVAD implant - VAD interrogated personally. See HPI - INR 1.5. Warfarin restarted 12/26. Dosing discussed with PharmD.  - Continue heparin gtt. - LDH [phone]  4. Severe low-flow aortic stenosis s/p AVR - s/p VAD/bioprosthetic AVR on 11/25   5. CAD  - s/p anterior STEMI (12/20). LHC showed chronically occluded RCA (with L>>R collaterals) and thrombotic occlusion of proximal LAD. Underwent PCI of LAD. - Now s/p VAD - Continue statin - see #1  6. VF - VF 12/23 s/p emergent DC-CV - Discussed with EP, would need ICD if has recurrent VT/VF   7. Paroxysmal Atrial Fibrillation  - Maintaining SR - Off amio   8. 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  - Bcx recollected and pending  - Tissue sent to [facility] in [place] for broad 16 S ribosomal sequencing for bacterial pathogens as well as T wet Bhilai Bartonella Brucella  - Ceftriaxone 2 g IV daily x 6 weeks. Completed May 22, 2021.  - ICD removed - No infectious sx. WBC ok    9. Ankylosing spondylitis - His local rheumatologist is hesitant to start remicade with VAD/infection risk - D/w [name] VAD program they have recommended f/u with Veva Holes, MD for 2nd opinion if needed.  - Currently feeling ok without it - PCP managing pain meds  10. Covid + 12/27 - antivirals discussed with pharmD - Symptoms improving   I reviewed the LVAD parameters from today, and compared the results to the patient's prior recorded data.  No programming changes were made.  The LVAD is functioning within specified parameters.  The patient performs LVAD self-test daily.  LVAD interrogation was negative for any significant power  changes, alarms or PI events/speed drops.  LVAD equipment check completed and is in good working order.  Back-up equipment present.   LVAD education done on emergency procedures and precautions and reviewed exit site care.  Length of Stay: [place], NP 05/09/2022, 7:28 AM  VAD Team --- VAD ISSUES ONLY--- Pager [phone] (7am - 7am)  Advanced Heart Failure Team  Pager [phone] (M-F; 7a - 5p)  Please contact [facility] for night-coverage after hours (5p -7a ) and weekends on [email or web address]  Patient seen and examined with the above-signed Advanced Practice Provider and/or Housestaff. I personally reviewed laboratory data, imaging studies and relevant notes. I independently examined the patient and formulated the important aspects of the plan. I have edited the note to reflect any of my changes or salient points. I have personally discussed the plan with the patient and/or family.  Feeling better as COVID improves. Denies CP or SOB. Rhythm stable. On low-dose heparin and warfarin. INR 1.5  Cardiac CT today reviewed. AoV is thickened and has acute and chronic thrombus on it. LM orifice is stable  General:  NAD.  HEENT: normal  Neck: supple. JVP not elevated.  Carotids 2+ bilat; no bruits. No lymphadenopathy or thryomegaly appreciated. Cor: LVAD hum.  Lungs: Clear. Abdomen: obese soft, nontender, non-distended. No hepatosplenomegaly. No bruits or masses. Good bowel sounds. Driveline site clean. Anchor in place.  Extremities: no cyanosis, clubbing, rash. Warm no edema  Neuro: alert & oriented x 3. No focal deficits. Moves all 4 without problem   Multiple discussions with multidisciplinary team (TCTS, interventional and imaging). AoV is thickened with acute and chronic clot on it. Felt to potentially be responsible for event.   Will increase heparin and run INR 2.5-3.0. Resume ASA Can repeat CT as needed to re-evaluate.   VAD interrogated personally. Parameters stable.  Glori Bickers, MD  4:58 PM

## 2022-05-09 NOTE — Progress Notes (Signed)
ANTICOAGULATION CONSULT NOTE   Pharmacy Consult for heparin > warfarin Indication: LVAD HM3  No Known Allergies  Patient Measurements: Height: 6' (182.9 cm) Weight: 91 kg (200 lb 9.9 oz) IBW/kg (Calculated) : 77.6   Vital Signs: Temp: 98 F (36.7 C) (12/28 0854) Temp Source: Oral (12/28 0854) BP: 85/74 (12/28 1700) Pulse Rate: 70 (12/28 1700)  Labs: Recent Labs    05/06/22 1902 05/06/22 2023 05/06/22 2305 05/07/22 0438 05/07/22 2028 05/08/22 0534 05/08/22 1435 05/09/22 0039 05/09/22 1750  HGB 13.8   < >  --  13.5  --  12.8*  --  13.2  --   HCT 43.8   < >  --  41.4  --  41.2  --  41.4  --   PLT 186  --   --  184  --  175  --  172  --   LABPROT 23.7*  --   --  21.0*  --  18.0*  --  18.4*  --   INR 2.1*  --   --  1.8*  --  1.5*  --  1.5*  --   HEPARINUNFRC  --   --   --   --    < > <0.10* <0.10* 0.10* <0.10*  CREATININE 1.03  --   --  0.97  --  0.96  --  0.94  --   TROPONINIHS 48*  --  8,627* >24,000*  --   --   --   --   --    < > = values in this interval not displayed.     Estimated Creatinine Clearance: 82.6 mL/min (by C-G formula based on SCr of 0.94 mg/dL).   Medical History: Past Medical History:  Diagnosis Date   AICD (automatic cardioverter/defibrillator) present 08/31/2019   Ankylosing spondylitis ([place])    Arthritis    BENIGN PROSTATIC HYPERTROPHY 06/07/2008   CHF (congestive heart failure) ([place])    COLONIC POLYPS, HX OF 06/07/2008   Coronary artery disease    Depression    H/O hiatal hernia    Heart failure ([place])    HYPERLIPIDEMIA 06/07/2008   HYPERTENSION 06/07/2008   Myocardial infarction ([place]) 2005   NSTEMI, s/p LAD stent   NEPHROLITHIASIS, HX OF 06/07/2008   STEMI (ST elevation myocardial infarction) ([place]) 04/27/2019     Assessment: 66yom with Hx endocarditis, bAVR and LVAD 11/22 admitted with CP > cath no acute infarct, TEE negative for vegetation. INR 2.1 on admit.   Pharmacy dosing heparin and warfarin. Heparin drip rate increased  to 1100 uts/hr last pm to achieve heparin level closer to 0.3 - increase cautiously with LVAD / bleeding risk  Heparin level this am 0.1 and INR 1.5 after missing dose on admit - will boost warfarin dose - now with increased INR goal with possible thrombus Cbc stable no bleeding   Heparin level came back<0.1 this PM. No bleeding per Rn. We will increase slightly tonight and check in 6 hr.  PTA warfarin 5mg  TTSa.2.5mg  MWFSu  Goal of Therapy:  INR goal 2.5-3 New Goal Heparin level 0.3  Monitor platelets by anticoagulation protocol: Yes   Plan:  -Increase heparin to 1200 units/hr Heparin in 6 hr Warfarin boost 7.5mg  x1 today -Daily heparin level, protime and CBC  Onnie Boer, PharmD, BCIDP, AAHIVP, CPP Infectious Disease Pharmacist 05/09/2022 6:44 PM

## 2022-05-09 NOTE — Consult Note (Signed)
   [facility] Inpatient Consult   05/09/2022  Brandon [name] 1955/12/29 [id number]  [facility] [ACO] Patient: [name]  Primary Care Provider:  [name], MD with [facility] [place] is listed to provide the Transition of Care follow up  Patient screened for less than 30 days readmission hospitalization with noted high risk score for unplanned readmission risk to assess for potential [place] Management service needs for post hospital transition for care coordination.  Review of patient's electronic medical record reveals patient is active with the [facility] [place]  LVAD team.  Plan: No current [facility] care coordination needs noted as patient is active with LVAD team for care coordination.  Of note, [facility] Care Management/Population Health does not replace or interfere with any arrangements made by the Inpatient Transition of Care team.  For questions contact:   Natividad Brood, RN BSN [place]  [phone] business mobile phone Toll free office [phone]  *[place]  [phone] Fax number: [phone] [email or web address]

## 2022-05-10 DIAGNOSIS — Z95811 Presence of heart assist device: Secondary | ICD-10-CM | POA: Diagnosis not present

## 2022-05-10 DIAGNOSIS — I2109 ST elevation (STEMI) myocardial infarction involving other coronary artery of anterior wall: Secondary | ICD-10-CM | POA: Diagnosis not present

## 2022-05-10 LAB — CBC
HCT: 41.9 % (ref 39.0–52.0)
Hemoglobin: 13.4 g/dL (ref 13.0–17.0)
MCH: 25.9 pg — ABNORMAL LOW (ref 26.0–34.0)
MCHC: 32 g/dL (ref 30.0–36.0)
MCV: 81 fL (ref 80.0–100.0)
Platelets: 183 10*3/uL (ref 150–400)
RBC: 5.17 MIL/uL (ref 4.22–5.81)
RDW: 16.3 % — ABNORMAL HIGH (ref 11.5–15.5)
WBC: 6.4 10*3/uL (ref 4.0–10.5)
nRBC: 0 % (ref 0.0–0.2)

## 2022-05-10 LAB — LACTATE DEHYDROGENASE: LDH: 213 U/L — ABNORMAL HIGH (ref 98–192)

## 2022-05-10 LAB — BASIC METABOLIC PANEL
Anion gap: 9 (ref 5–15)
BUN: 22 mg/dL (ref 8–23)
CO2: 24 mmol/L (ref 22–32)
Calcium: 8.9 mg/dL (ref 8.9–10.3)
Chloride: 104 mmol/L (ref 98–111)
Creatinine, Ser: 0.98 mg/dL (ref 0.61–1.24)
GFR, Estimated: >60 mL/min (ref >60)
Glucose, Bld: 110 mg/dL — ABNORMAL HIGH (ref 70–99)
Potassium: 3.8 mmol/L (ref 3.5–5.1)
Sodium: 137 mmol/L (ref 135–145)

## 2022-05-10 LAB — MAGNESIUM: Magnesium: 2 mg/dL (ref 1.7–2.4)

## 2022-05-10 LAB — PROTIME-INR
INR: 1.8 — ABNORMAL HIGH (ref 0.8–1.2)
Prothrombin Time: 21.1 seconds — ABNORMAL HIGH (ref 11.4–15.2)

## 2022-05-10 LAB — HEPARIN LEVEL (UNFRACTIONATED)
Heparin Unfractionated: 0.41 IU/mL (ref 0.30–0.70)
Heparin Unfractionated: <0.1 IU/mL — ABNORMAL LOW (ref 0.30–0.70)

## 2022-05-10 MED ORDER — WARFARIN SODIUM 7.5 MG PO TABS
7.5000 mg | ORAL_TABLET | Freq: Once | ORAL | Status: AC
  Administered 2022-05-10: 7.5 mg via ORAL
  Filled 2022-05-10: qty 1

## 2022-05-10 NOTE — Progress Notes (Signed)
Mobility Specialist: Progress Note   05/10/22 1330  Mobility  Activity Ambulated with assistance in hallway  Level of Assistance Contact guard assist, steadying assist  Assistive Device Other (Comment) (IV pole)  Distance Ambulated (ft) 100 ft  Activity Response Tolerated well  Mobility Referral Yes  $Mobility charge 1 Mobility   Pt received in the bed and agreeable to mobility. Mod I with bed mobility as well as to stand. Contact guard during ambulation. C/o mild SOB during session, sats >90% throughout. No c/o pain or dizziness. Pt to the chair after session with call bell and phone in reach.   [place] Brandon [name] Mobility Specialist Please contact via SecureChat or Rehab office at [phone]

## 2022-05-10 NOTE — Progress Notes (Signed)
ANTICOAGULATION CONSULT NOTE   Pharmacy Consult for heparin > warfarin Indication: LVAD HM3  No Known Allergies  Patient Measurements: Height: 6' (182.9 cm) Weight: 88.9 kg (195 lb 15.8 oz) IBW/kg (Calculated) : 77.6   Vital Signs: Temp: 97.6 F (36.4 C) (12/29 0515) Temp Source: Oral (12/29 0515) BP: 101/88 (12/29 0515) Pulse Rate: 59 (12/29 0515)  Labs: Recent Labs    05/08/22 0534 05/08/22 1435 05/09/22 0039 05/09/22 1750 05/10/22 0037  HGB 12.8*  --  13.2  --  13.4  HCT 41.2  --  41.4  --  41.9  PLT 175  --  172  --  183  LABPROT 18.0*  --  18.4*  --  21.1*  INR 1.5*  --  1.5*  --  1.8*  HEPARINUNFRC <0.10*   < > 0.10* <0.10* 0.41  CREATININE 0.96  --  0.94  --  0.98   < > = values in this interval not displayed.     Estimated Creatinine Clearance: 79.2 mL/min (by C-G formula based on SCr of 0.98 mg/dL).   Medical History: Past Medical History:  Diagnosis Date   AICD (automatic cardioverter/defibrillator) present 08/31/2019   Ankylosing spondylitis ([place])    Arthritis    BENIGN PROSTATIC HYPERTROPHY 06/07/2008   CHF (congestive heart failure) ([place])    COLONIC POLYPS, HX OF 06/07/2008   Coronary artery disease    Depression    H/O hiatal hernia    Heart failure ([place])    HYPERLIPIDEMIA 06/07/2008   HYPERTENSION 06/07/2008   Myocardial infarction ([place]) 2005   NSTEMI, s/p LAD stent   NEPHROLITHIASIS, HX OF 06/07/2008   STEMI (ST elevation myocardial infarction) ([place]) 04/27/2019     Assessment: 66yom with Hx endocarditis, bAVR and LVAD 11/22 admitted with CP > cath no acute infarct, TEE negative for vegetation. INR 2.1 on admit.   Pharmacy dosing heparin and warfarin. Heparin level came back elevated at 0.41, on 1200 units/hr. Running in PIV - but RN was unaware how drawn. Repeat level came back <0.1. CBC stable. LDH stable at 213. INR increased up to 1.8.    PTA warfarin 5mg  TTSa.2.5mg  MWFSu  Goal of Therapy:  INR goal 2.5-3 (new goal) Heparin  level 0.3  Monitor platelets by anticoagulation protocol: Yes   Plan:  -Continue heparin infusion at 1200 units/hr for now -Warfarin 7.5mg  x1 again tonight  -Daily heparin level, protime and CBC  Antonietta Jewel, PharmD, [place] Pharmacist  Phone: [phone] 05/10/2022 7:36 AM  Please check AMION for all [place] phone numbers After 10:00 PM, call [place] [phone]

## 2022-05-10 NOTE — Progress Notes (Addendum)
Advanced Heart Failure VAD Team Note  PCP-Cardiologist: [name], MD   Subjective:    12/25 Presented with CP and anterior ST elevation 12/25: R/LHC with stable CAD, low filling pressures with normal CO. ? Dissection flap around AVR CTA chest 12/25: No evidence of dissection flap TEE 12/26: EF 20%, normal bioprosthetic AVR with minimal opening, no AI  Central access pulled 12/27. Covid + 12/27, had congestion, dyspnea, body aches and chills with recent exposure.   CT chest and cath images reviewed with Dr. Darcey Nora. Concern for possible penetrating ulcer at aortic root/origin LM. >> Cardiac CT 12/28: concern for acute on chronic aortic valve thrombus  Feels great this morning.   LVAD INTERROGATION:  HeartMate III LVAD:   Flow 4.4 liters/min, speed 5800, power 4.7, PI 6.3. 63 PI events so far this am, no power spikes, power occasionally dips and goes back to baseline  Objective:    Vital Signs:   Temp:  [97.3 F (36.3 C)-98 F (36.7 C)] 97.6 F (36.4 C) (12/29 0515) Pulse Rate:  [59-95] 59 (12/29 0515) Resp:  [15-22] 20 (12/29 0515) BP: (80-107)/(49-88) 101/88 (12/29 0515) SpO2:  [94 %-98 %] 94 % (12/29 0515) Weight:  [88.9 kg] 88.9 kg (12/29 0506) Last BM Date :  (PTA) Mean arterial Pressure 70s-80s  Intake/Output:   Intake/Output Summary (Last 24 hours) at 05/10/2022 0630 Last data filed at 05/10/2022 0300 Gross per 24 hour  Intake 758.89 ml  Output 1450 ml  Net -691.11 ml     Physical Exam    General:  Well appearing. No resp difficulty HEENT: Normal Neck: supple. No JVP. Carotids 2+ bilat; no bruits. No lymphadenopathy or thyromegaly appreciated. Cor: Mechanical heart sounds with LVAD hum present. Lungs: Clear Abdomen: soft, nontender, nondistended. No hepatosplenomegaly. No bruits or masses. Good bowel sounds. Driveline: C/D/I; securement device intact and driveline incorporated Extremities: no cyanosis, clubbing, rash, edema Neuro: alert &  orientedx3, cranial nerves grossly intact. moves all 4 extremities w/o difficulty. Affect pleasant   Telemetry  NSR 60s, 6 beat NSVT (Personally reviewed)   Labs   Basic Metabolic Panel: Recent Labs  Lab 05/06/22 1902 05/06/22 2023 05/06/22 2107 05/07/22 0438 05/08/22 0534 05/09/22 0039 05/10/22 0037  NA 131*   < > 134* 137 135 137 137  K 4.9   < > 3.8 3.5 3.7 3.5 3.8  CL 102  --   --  103 104 105 104  CO2 21*  --   --  25 24 23 24   GLUCOSE 124*  --   --  102* 109* 101* 110*  BUN 21  --   --  18 22 20 22   CREATININE 1.03  --   --  0.97 0.96 0.94 0.98  CALCIUM 8.8*  --   --  8.6* 8.7* 8.7* 8.9  MG  --   --   --   --  2.0 2.0 2.0   < > = values in this interval not displayed.    Liver Function Tests: Recent Labs  Lab 05/06/22 1902  AST 35  ALT 20  ALKPHOS 123  BILITOT 1.7*  PROT 7.0  ALBUMIN 3.6   No results for input(s): "LIPASE", "AMYLASE" in the last 168 hours. No results for input(s): "AMMONIA" in the last 168 hours.  CBC: Recent Labs  Lab 05/06/22 1902 05/06/22 2023 05/06/22 2107 05/07/22 0438 05/08/22 0534 05/09/22 0039 05/10/22 0037  WBC 15.1*  --   --  9.3 8.1 6.1 6.4  NEUTROABS 13.3*  --   --   --   --   --   --  HGB 13.8   < > 15.3 13.5 12.8* 13.2 13.4  HCT 43.8   < > 45.0 41.4 41.2 41.4 41.9  MCV 82.8  --   --  80.9 82.9 83.0 81.0  PLT 186  --   --  184 175 172 183   < > = values in this interval not displayed.    INR: Recent Labs  Lab 05/06/22 1902 05/07/22 0438 05/08/22 0534 05/09/22 0039 05/10/22 0037  INR 2.1* 1.8* 1.5* 1.5* 1.8*    Other results:    Imaging   CT ANGIO CHEST AORTA W/CM & OR WO/CM  Result Date: 05/09/2022 CLINICAL DATA:  Status post aortic valve repair. EXAM: CT ANGIOGRAPHY CHEST WITH CONTRAST TECHNIQUE: Multidetector CT imaging of the chest was performed using the standard protocol during bolus administration of intravenous contrast. Multiplanar CT image reconstructions and MIPs were obtained to evaluate  the vascular anatomy. RADIATION DOSE REDUCTION: This exam was performed according to the departmental dose-optimization program which includes automated exposure control, adjustment of the mA and/or kV according to patient size and/or use of iterative reconstruction technique. CONTRAST:  150mL OMNIPAQUE IOHEXOL 350 MG/ML SOLN COMPARISON:  May 06, 2022. April 30, 2022. March 11, 2021. FINDINGS: Cardiovascular: Status post aortic valve repair. Also status post left ventricular assist device with inflow graft flowing from ascending thoracic aorta. Atherosclerosis of thoracic aorta is noted without dissection. Visualized great vessels are widely patent. Coronary artery calcifications are noted. Mediastinum/Nodes: No enlarged mediastinal, hilar, or axillary lymph nodes. Thyroid gland, trachea, and esophagus demonstrate no significant findings. Lungs/Pleura: The lung fields are not completely visualized as the lung apices are not included in the field-of-view. Similar disease is noted. No pleural effusion is noted. There is again noted 7 mm irregular nodule in the left upper lobe best seen on image number 9 of series 3 concerning for malignancy as it was not present in 2022. Upper Abdomen: No acute abnormality. Musculoskeletal: No acute abnormality seen involving the visualized skeleton. Review of the MIP images confirms the above findings. IMPRESSION: Status post aortic valve repair and left ventricular assist device placement. Coronary artery calcifications are noted suggesting coronary disease. Lung fields are not completely visualized as the lung apices are not included in field-of-view. There is again noted 7 mm irregular nodule in left upper lobe which was not present in 2022 and is concerning for malignancy. As noted on prior CT scan of April 30, 2022, consultation with pulmonology and thoracic surgery is recommended. Aortic Atherosclerosis (ICD10-I70.0) and Emphysema (ICD10-J43.9). Electronically  Signed   By: Marijo Conception M.D.   On: 05/09/2022 13:42   CT CORONARY MORPH W/CTA COR W/SCORE W/CA W/CM &/OR WO/CM  Result Date: 05/09/2022 EXAM: OVER-READ INTERPRETATION  CT CHEST The following report is a limited chest CT over-read performed by radiologist Dr. Marijo Conception of [facility], [place] on 05/09/2022. This over-read does not include interpretation of cardiac or coronary anatomy or pathology. The coronary CTA interpretation by the cardiologist is attached. COMPARISON:  May 06, 2022. April 30, 2022. March 11, 2021. FINDINGS: Vascular: Atherosclerosis of visualized thoracic aorta is noted. Left ventricular assist device is noted. Mediastinum/Nodes: Visualized mediastinum is unremarkable. Lungs/Pleura: 7 mm nodule is noted in left upper lobe best seen on image number 8 of series 10. This is not clearly visualized on prior exam of March 11, 2021. Upper Abdomen: No acute abnormality. Musculoskeletal: No chest wall mass or suspicious bone lesions identified. IMPRESSION: 7 mm nodule is noted in left upper lobe  that was not present in 2022, and is concerning for malignancy. This was previously described on the CT scan of April 30, 2022. As noted on that CT scan, additional imaging evaluation or consultation with pulmonology or thoracic surgery is recommended. These results will be called to the ordering clinician or representative by the Radiologist Assistant, and communication documented in the PACS or zVision Dashboard. Electronically Signed   By: Marijo Conception M.D.   On: 05/09/2022 13:27   DG Chest Port 1 View  Result Date: 05/08/2022 CLINICAL DATA:  Shortness of breath EXAM: PORTABLE CHEST 1 VIEW COMPARISON:  05/06/2022 FINDINGS: Left ventricular assist device remains in place. New right internal jugular approach pulmonary arterial catheter terminates within the right proximal pulmonary outflow. Stable heart size status post sternotomy and cardiac valve replacement. Aortic  atherosclerosis. Diffuse bilateral interstitial prominence with slightly improved aeration of the lungs compared to prior. No large pleural fluid collection. No pneumothorax. Nerve stimulator pack overlies the right chest. IMPRESSION: 1. Diffuse bilateral interstitial prominence with slightly improved aeration of the lungs compared to prior. 2. New right internal jugular approach pulmonary arterial catheter terminates within the right proximal pulmonary outflow. No pneumothorax. Electronically Signed   By: Davina Poke D.O.   On: 05/08/2022 09:27     Medications:     Scheduled Medications:  aspirin EC  81 mg Oral Daily   atorvastatin  80 mg Oral QHS   ezetimibe  10 mg Oral Daily   guaiFENesin  600 mg Oral BID   losartan  12.5 mg Oral Daily   melatonin  5 mg Oral QHS   metoprolol succinate  12.5 mg Oral Daily   pantoprazole  40 mg Oral BID   potassium chloride  40 mEq Oral Daily   sertraline  100 mg Oral Daily   sodium chloride flush  3 mL Intravenous Q12H   tamsulosin  0.4 mg Oral Daily   torsemide  20 mg Oral Daily   Warfarin - Pharmacist Dosing Inpatient   Does not apply q1600    Infusions:  sodium chloride Stopped (05/08/22 1201)   sodium chloride     heparin 1,200 Units/hr (05/10/22 0300)    PRN Medications: sodium chloride, acetaminophen, dextromethorphan-guaiFENesin, ondansetron (ZOFRAN) IV, mouth rinse, sodium chloride, sodium chloride flush   Assessment/Plan:   1. Anterior STEMI - Recent VF requiring emergent DCCV - HS troponin > 24K - R/LHC - stable CAD, low filling pressures, normal CO. ?evidence of pre-existing aortic root dissection around AVR - CTA chest - no dissection flap or evidence of pump thrombosis - TEE 12/26: EF 20%, normal aortic valve prosthesis, minimal opening, no AI - Start 12.5 metoprolol succinate today - CT and cath images reviewed with Dr. Darcey Nora. Concern for possible penetrating ulcer at aortic root/origin LM.  - Cardiac CT yesterday  with concerns for acute on chronic aortic valve thrombus, will continue hep gtt for now   2. Acute on Chronic systolic HFr EF due to iCM in setting of STEMI - Echo 05/19/20 EF 20-25% RV mildly HK. moderate AS  Mean gradient 13 AVA 1.2 cm2 DI 0.30 - s/p HM-III VAD + bioprosthetic AVR 04/06/21 - Baseline NYHA I.  - Volume stable on assessment, will hold daily torsemide with increase PI events - Holding home eplerenone - Continue losartan 12.5 daily   3. HM-3 LVAD implant - VAD interrogated personally. See HPI - INR 1.8. Warfarin restarted 12/26. Dosing discussed with PharmD.  - Continue heparin gtt. - LDH 273>240>243>213 - high  PI events this am, no power spikes, LDH stable, remains on hep gtt. Will hold diuretic as above   4. Severe low-flow aortic stenosis s/p AVR - s/p VAD/bioprosthetic AVR on 11/25   5. CAD  - s/p anterior STEMI (12/20). LHC showed chronically occluded RCA (with L>>R collaterals) and thrombotic occlusion of proximal LAD. Underwent PCI of LAD. - Now s/p VAD - Continue statin - see #1  6. VF - VF 12/23 s/p emergent DC-CV - Discussed with EP, would need ICD if has recurrent VT/VF   7. Paroxysmal Atrial Fibrillation  - Maintaining SR - Off amio   8. 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  - Bcx recollected and pending  - Tissue sent to [facility] in [place] for broad 16 S ribosomal sequencing for bacterial pathogens as well as T wet Bhilai Bartonella Brucella  - Ceftriaxone 2 g IV daily x 6 weeks. Completed May 22, 2021.  - ICD removed - No infectious sx. WBC ok    9. Ankylosing spondylitis - His local rheumatologist is hesitant to start remicade with VAD/infection risk - D/w [name] VAD program they have recommended f/u with Veva Holes, MD for 2nd opinion if needed.  - Currently feeling ok without it - PCP managing pain meds  10. Covid + 12/27 - antivirals  discussed with pharmD, none req - Symptoms improving   I reviewed the LVAD parameters from today, and compared the results to the patient's prior recorded data.  No programming changes were made.  The LVAD is functioning within specified parameters.  The patient performs LVAD self-test daily.  LVAD interrogation was negative for any significant power changes, alarms or PI events/speed drops.  LVAD equipment check completed and is in good working order.  Back-up equipment present.   LVAD education done on emergency procedures and precautions and reviewed exit site care.  Length of Stay: [place], NP 05/10/2022, 7:02 AM  VAD Team --- VAD ISSUES ONLY--- Pager [phone] (7am - 7am)  Advanced Heart Failure Team  Pager [phone] (M-F; 7a - 5p)  Please contact [facility] for night-coverage after hours (5p -7a ) and weekends on [email or web address]  Patient seen and examined with the above-signed Advanced Practice Provider and/or Housestaff. I personally reviewed laboratory data, imaging studies and relevant notes. I independently examined the patient and formulated the important aspects of the plan. I have edited the note to reflect any of my changes or salient points. I have personally discussed the plan with the patient and/or family.  Feeling much better as he recovers from [place]. On heparin/warfarin. INR 1.8. No bleeding  Rhythm and VAD parameters stable. No CP or SOB.   General:  NAD.  HEENT: normal  Neck: supple. JVP not elevated.  Carotids 2+ bilat; no bruits. No lymphadenopathy or thryomegaly appreciated. Cor: LVAD hum.  Lungs: Clear. Abdomen: soft, nontender, non-distended. No hepatosplenomegaly. No bruits or masses. Good bowel sounds. Driveline site clean. Anchor in place.  Extremities: no cyanosis, clubbing, rash. Warm no edema  Neuro: alert & oriented x 3. No focal deficits. Moves all 4 without problem   I have reviewed case extensively with Drs. O'Neal and [employer]. Suspect that  he likely had thrombus off AoV down LAD. Will plan to continue heparin until INR > = 2.0 then run INR 2.5-3.0 with ASA. We discussed possible low-dose t-pa protocol but felt that as valve not opening much this  was likely not best option as thrombus likely to recur.   Glori Bickers, MD  7:06 PM

## 2022-05-10 NOTE — Progress Notes (Signed)
LVAD Coordinator Rounding Note:  Admitted overnight due to complaints of crushing chest pain, vomiting and SOB. Pt found to have ST elevation and was taken directly to the cath lab for [facility].  R/LHC 12/25: - R/LHC - stable CAD, low filling pressures, normal CO. ?evidence of pre-existing aortic root dissection around AVR   HM3 LVAD implanted on 04/06/21 by Dr. Cyndia Bent under DT criteria.  TEE 12/26: LV EF 20%, RV WNL. See DB note 12/26 for further details.   Pt laying in bed upon my arrival. Respiratory panel 12/27 + COVID. Reports he is feeling better, other than fatigued.  Denies shortness of breath or chest pain.  CT and cardiac cath images reviewed with Dr. Prescott Gum. Cardiac CT obtained 12/28- acute on chronic aortic valve thrombus seen. Per Dr [name] will continue ASA 81 mg and increase INR goal to 2.5 - 3.0. Continue Heparin drip for now.   Right hand PIV kinked and pulling out of hand. Pt reports pain at site. Heparin drip infusing- stopped. PIV discontinued, pressure held until hemostasis achieved. Minimal redness/swelling noted at site. New Heparin drip/tubing started in left arm PIV. Bedside RN aware.   80 + PI events per day noted on interrogation. Pt asymptomatic. Flow/PI/BP stable. Pt reports decreased PO intake with feeling poorly over the last few days. Instructed to increase PO intake. He verbalized understanding.   Vital signs: Temp: 97.7 HR: 86 Doppler Pressure: 80 Automatic BP: 92/68 (75) O2 Sat: 95% on RA Wt: 200.8> 200.6>195.9 lbs   LVAD interrogation reveals:  Speed: 5800 Flow: 5.2 Power:  4.9 w PI: 4.7 Alarms: none Events:  >80 yesterday; 60 so far today Hematocrit: 41  Fixed speed: 5800 Low speed limit: 5500  Drive Line: Existing VAD dressing clean, dry, and intact. Anchor correctly applied. Next dressing change due 05/14/21 by VAD Coordinator, caregiver or bedside nurse.   Labs:  LDH trend: 273>240>243>213  INR trend: 1.8>1.5>1.5>1.8  Troponin:  48>8,627>24,000  WBC trend: 15.1>9.3>8.1>6.1>6.4  Anticoagulation Plan: -INR Goal: 2.5 - 3.0 (increased 05/09/22) -ASA Dose: restarted 81 mg daily this admission  Blood Products:   Device:N/A  Arrythmias: none this admission  -DCCV 04/22/22 VFIB   Plan/Recommendations:  1. Page VAD Coordinator with any equipment of VAD concerns 2. Weekly drive line dressing change per beside RN  Emerson Monte RN [place] Coordinator  Office: [phone]  24/7 Pager: [phone]

## 2022-05-11 DIAGNOSIS — I214 Non-ST elevation (NSTEMI) myocardial infarction: Secondary | ICD-10-CM | POA: Diagnosis not present

## 2022-05-11 DIAGNOSIS — Z95811 Presence of heart assist device: Secondary | ICD-10-CM | POA: Diagnosis not present

## 2022-05-11 DIAGNOSIS — I5023 Acute on chronic systolic (congestive) heart failure: Secondary | ICD-10-CM | POA: Diagnosis not present

## 2022-05-11 LAB — CBC
HCT: 40.8 % (ref 39.0–52.0)
Hemoglobin: 13 g/dL (ref 13.0–17.0)
MCH: 25.7 pg — ABNORMAL LOW (ref 26.0–34.0)
MCHC: 31.9 g/dL (ref 30.0–36.0)
MCV: 80.6 fL (ref 80.0–100.0)
Platelets: 193 10*3/uL (ref 150–400)
RBC: 5.06 MIL/uL (ref 4.22–5.81)
RDW: 15.9 % — ABNORMAL HIGH (ref 11.5–15.5)
WBC: 8.2 10*3/uL (ref 4.0–10.5)
nRBC: 0 % (ref 0.0–0.2)

## 2022-05-11 LAB — LACTATE DEHYDROGENASE: LDH: 198 U/L — ABNORMAL HIGH (ref 98–192)

## 2022-05-11 LAB — BASIC METABOLIC PANEL
Anion gap: 9 (ref 5–15)
BUN: 24 mg/dL — ABNORMAL HIGH (ref 8–23)
CO2: 23 mmol/L (ref 22–32)
Calcium: 8.9 mg/dL (ref 8.9–10.3)
Chloride: 106 mmol/L (ref 98–111)
Creatinine, Ser: 0.82 mg/dL (ref 0.61–1.24)
GFR, Estimated: >60 mL/min (ref >60)
Glucose, Bld: 107 mg/dL — ABNORMAL HIGH (ref 70–99)
Potassium: 3.6 mmol/L (ref 3.5–5.1)
Sodium: 138 mmol/L (ref 135–145)

## 2022-05-11 LAB — PROTIME-INR
INR: 2.1 — ABNORMAL HIGH (ref 0.8–1.2)
Prothrombin Time: 23.2 seconds — ABNORMAL HIGH (ref 11.4–15.2)

## 2022-05-11 LAB — HEPARIN LEVEL (UNFRACTIONATED): Heparin Unfractionated: 0.1 IU/mL — ABNORMAL LOW (ref 0.30–0.70)

## 2022-05-11 LAB — MAGNESIUM: Magnesium: 2.1 mg/dL (ref 1.7–2.4)

## 2022-05-11 MED ORDER — WARFARIN SODIUM 7.5 MG PO TABS: 7.5000 mg | ORAL_TABLET | Freq: Once | ORAL | Status: DC

## 2022-05-11 MED ORDER — WARFARIN SODIUM 7.5 MG PO TABS
7.5000 mg | ORAL_TABLET | Freq: Once | ORAL | Status: AC
  Administered 2022-05-11: 7.5 mg via ORAL
  Filled 2022-05-11: qty 1

## 2022-05-11 MED ORDER — POTASSIUM CHLORIDE CRYS ER 20 MEQ PO TBCR
40.0000 meq | EXTENDED_RELEASE_TABLET | Freq: Once | ORAL | Status: AC
  Administered 2022-05-11: 40 meq via ORAL
  Filled 2022-05-11: qty 2

## 2022-05-11 NOTE — Plan of Care (Signed)
  Problem: Education: Goal: Knowledge of General Education information will improve Description: Including pain rating scale, medication(s)/side effects and non-pharmacologic comfort measures Outcome: Progressing   Problem: Health Behavior/Discharge Planning: Goal: Ability to manage health-related needs will improve Outcome: Progressing   Problem: Clinical Measurements: Goal: Ability to maintain clinical measurements within normal limits will improve Outcome: Progressing Goal: Will remain free from infection Outcome: Progressing Goal: Diagnostic test results will improve Outcome: Progressing Goal: Respiratory complications will improve Outcome: Progressing Goal: Cardiovascular complication will be avoided Outcome: Progressing   Problem: Activity: Goal: Risk for activity intolerance will decrease Outcome: Progressing   Problem: Nutrition: Goal: Adequate nutrition will be maintained Outcome: Progressing   Problem: Coping: Goal: Level of anxiety will decrease Outcome: Progressing   Problem: Elimination: Goal: Will not experience complications related to bowel motility Outcome: Progressing Goal: Will not experience complications related to urinary retention Outcome: Progressing   Problem: Pain Managment: Goal: General experience of comfort will improve Outcome: Progressing   Problem: Safety: Goal: Ability to remain free from injury will improve Outcome: Progressing   Problem: Skin Integrity: Goal: Risk for impaired skin integrity will decrease Outcome: Progressing   Problem: Education: Goal: Patient will understand all VAD equipment and how it functions Outcome: Progressing Goal: Patient will be able to verbalize current INR target range and antiplatelet therapy for discharge home Outcome: Progressing   Problem: Cardiac: Goal: LVAD will function as expected and patient will experience no clinical alarms Outcome: Progressing   Problem: Education: Goal:  Understanding of CV disease, CV risk reduction, and recovery process will improve Outcome: Progressing Goal: Individualized Educational Video(s) Outcome: Progressing   Problem: Activity: Goal: Ability to return to baseline activity level will improve Outcome: Progressing   Problem: Cardiovascular: Goal: Ability to achieve and maintain adequate cardiovascular perfusion will improve Outcome: Progressing   Problem: Health Behavior/Discharge Planning: Goal: Ability to safely manage health-related needs after discharge will improve Outcome: Progressing   Problem: Education: Goal: Knowledge of risk factors and measures for prevention of condition will improve Outcome: Progressing   Problem: Coping: Goal: Psychosocial and spiritual needs will be supported Outcome: Progressing   Problem: Respiratory: Goal: Will maintain a patent airway Outcome: Progressing Goal: Complications related to the disease process, condition or treatment will be avoided or minimized Outcome: Progressing

## 2022-05-11 NOTE — Progress Notes (Signed)
ANTICOAGULATION CONSULT NOTE   Pharmacy Consult for heparin > warfarin Indication: LVAD HM3  No Known Allergies  Patient Measurements: Height: 6' (182.9 cm) Weight: 88.9 kg (195 lb 15.8 oz) IBW/kg (Calculated) : 77.6   Vital Signs: Temp: 97.7 F (36.5 C) (12/30 1500) Temp Source: Oral (12/30 1500) BP: 97/79 (12/30 1500) Pulse Rate: 83 (12/30 1500)  Labs: Recent Labs    05/09/22 0039 05/09/22 1750 05/10/22 0037 05/10/22 0822 05/11/22 0024  HGB 13.2  --  13.4  --  13.0  HCT 41.4  --  41.9  --  40.8  PLT 172  --  183  --  193  LABPROT 18.4*  --  21.1*  --  23.2*  INR 1.5*  --  1.8*  --  2.1*  HEPARINUNFRC 0.10*   < > 0.41 <0.10* 0.10*  CREATININE 0.94  --  0.98  --  0.82   < > = values in this interval not displayed.     Estimated Creatinine Clearance: 94.6 mL/min (by C-G formula based on SCr of 0.82 mg/dL).   Medical History: Past Medical History:  Diagnosis Date   AICD (automatic cardioverter/defibrillator) present 08/31/2019   Ankylosing spondylitis ([place])    Arthritis    BENIGN PROSTATIC HYPERTROPHY 06/07/2008   CHF (congestive heart failure) ([place])    COLONIC POLYPS, HX OF 06/07/2008   Coronary artery disease    Depression    H/O hiatal hernia    Heart failure ([place])    HYPERLIPIDEMIA 06/07/2008   HYPERTENSION 06/07/2008   Myocardial infarction ([place]) 2005   NSTEMI, s/p LAD stent   NEPHROLITHIASIS, HX OF 06/07/2008   STEMI (ST elevation myocardial infarction) ([place]) 04/27/2019     Assessment: 66yom with Hx endocarditis, bAVR and LVAD 11/22 admitted with CP > cath no acute infarct, TEE negative for vegetation. INR 2.1 on admit.   Pharmacy dosing heparin and warfarin. Heparin level came back elevated at 0.41, on 1200 units/hr. Running in PIV - but RN was unaware how drawn. Repeat level came back <0.1. CBC stable. LDH stable at 213. INR increased up to 2.1. Heparin stopped.   PTA warfarin 5mg  TTSa.2.5mg  MWFSu  Goal of Therapy:  INR goal 2.5-3 (new  goal)  Monitor platelets by anticoagulation protocol: Yes   Plan:  -Heparin stopped. -Warfarin 7.5mg  x1 again tonight  -Daily heparin level, protime and CBC  [place] Crane, Vena [place], BCPS, BCCP Clinical Pharmacist  05/11/2022 3:35 PM   [facility] pharmacy phone numbers are listed on [email or web address]

## 2022-05-11 NOTE — Progress Notes (Signed)
Patient ID: Brandon [name], male   DOB: 1956-04-29, 66 y.o.   MRN: [id number]   Advanced Heart Failure VAD Team Note  PCP-Cardiologist: [name], MD   Subjective:    12/25 Presented with CP and anterior ST elevation 12/25: R/LHC with stable CAD, low filling pressures with normal CO. ? Dissection flap around AVR CTA chest 12/25: No evidence of dissection flap TEE 12/26: EF 20%, normal bioprosthetic AVR with minimal opening, no AI  Central access pulled 12/27. Covid + 12/27, had congestion, dyspnea, body aches and chills with recent exposure.   Cardiac CT 12/28: concern for acute on chronic aortic valve thrombus. Suspect that he likely had thrombus off AoV down LAD.  Feeling better overall.  No cough.  Walked in hall yesterday.  INR up to 2.1, remains on heparin gtt.   LVAD INTERROGATION:  HeartMate III LVAD:   Flow 4.8 liters/min, speed 5800, power 5, PI 5.3. About 20 PI events over the last 24 hrs  Objective:    Vital Signs:   Temp:  [97.3 F (36.3 C)-98.1 F (36.7 C)] 97.7 F (36.5 C) (12/30 0726) Pulse Rate:  [65-100] 65 (12/30 0726) Resp:  [14-20] 17 (12/30 0726) BP: (99-111)/(66-90) 99/66 (12/30 0726) SpO2:  [93 %-96 %] 95 % (12/30 0726) Weight:  [88.9 kg] 88.9 kg (12/30 0417) Last BM Date : 05/10/22 Mean arterial Pressure 70s-80s  Intake/Output:   Intake/Output Summary (Last 24 hours) at 05/11/2022 0929 Last data filed at 05/11/2022 0500 Gross per 24 hour  Intake 1156.58 ml  Output 250 ml  Net 906.58 ml     Physical Exam    General: Well appearing this am. NAD.  HEENT: Normal. Neck: Supple, JVP 7-8 cm. Carotids OK.  Cardiac:  Mechanical heart sounds with LVAD hum present.  Lungs:  CTAB, normal effort.  Abdomen:  NT, ND, no HSM. No bruits or masses. +BS  LVAD exit site: Well-healed and incorporated. Dressing dry and intact. No erythema or drainage. Stabilization device present and accurately applied. Driveline dressing changed daily per sterile  technique. Extremities:  Warm and dry. No cyanosis, clubbing, rash, or edema.  Neuro:  Alert & oriented x 3. Cranial nerves grossly intact. Moves all 4 extremities w/o difficulty. Affect pleasant     Telemetry  NSR 70s, occasional PVCs (Personally reviewed)   Labs   Basic Metabolic Panel: Recent Labs  Lab 05/07/22 0438 05/08/22 0534 05/09/22 0039 05/10/22 0037 05/11/22 0024  NA 137 135 137 137 138  K 3.5 3.7 3.5 3.8 3.6  CL 103 104 105 104 106  CO2 25 24 23 24 23   GLUCOSE 102* 109* 101* 110* 107*  BUN 18 22 20 22  24*  CREATININE 0.97 0.96 0.94 0.98 0.82  CALCIUM 8.6* 8.7* 8.7* 8.9 8.9  MG  --  2.0 2.0 2.0 2.1    Liver Function Tests: Recent Labs  Lab 05/06/22 1902  AST 35  ALT 20  ALKPHOS 123  BILITOT 1.7*  PROT 7.0  ALBUMIN 3.6   No results for input(s): "LIPASE", "AMYLASE" in the last 168 hours. No results for input(s): "AMMONIA" in the last 168 hours.  CBC: Recent Labs  Lab 05/06/22 1902 05/06/22 2023 05/07/22 0438 05/08/22 0534 05/09/22 0039 05/10/22 0037 05/11/22 0024  WBC 15.1*  --  9.3 8.1 6.1 6.4 8.2  NEUTROABS 13.3*  --   --   --   --   --   --   HGB 13.8   < > 13.5 12.8* 13.2 13.4 13.0  HCT 43.8   < > 41.4 41.2 41.4 41.9 40.8  MCV 82.8  --  80.9 82.9 83.0 81.0 80.6  PLT 186  --  184 175 172 183 193   < > = values in this interval not displayed.    INR: Recent Labs  Lab 05/07/22 0438 05/08/22 0534 05/09/22 0039 05/10/22 0037 05/11/22 0024  INR 1.8* 1.5* 1.5* 1.8* 2.1*    Other results:    Imaging   CT CORONARY MORPH W/CTA COR W/SCORE W/CA W/CM &/OR WO/CM  Addendum Date: 05/10/2022   ADDENDUM REPORT: 05/10/2022 16:32 CLINICAL DATA:  Ventricular Fibrillation. S/p Heartmate 3 LVAD with aortic valve replacement 04/06/2021. EXAM: Retrospective cardiac CT TECHNIQUE: A non-contrast, gated CT scan was obtained with axial slices of 3 mm through the heart for aortic valve calcium scoring. A 120 kV retrospective, gated, contrast cardiac  scan was obtained. Gantry rotation speed was 250 msecs and collimation was 0.6 mm. Nitroglycerin was not given. The 3D data set was reconstructed in 5% intervals of the 0-95% of the R-R cycle. Systolic and diastolic phases were analyzed on a dedicated workstation using MPR, MIP, and VRT modes. The patient received 100 cc of contrast. FINDINGS: Image quality: Excellent. Noise artifact is: Limited. LVAD: A Heartmate 3 is present. The inflow cannula is present at the LV apex and is patent. The outflow graft is patent and terminates in the ascending aorta. Aortic prosthesis: A 25 mm Edwards Inspiris pericardial bioprosthetic valve is present in the aortic position. The valve leaflets are thickened and the leaflets do not open. The HU suggest acute on chronic valve thrombosis. Coronary Arteries: Normal coronary origin. Right dominance. The study was performed without use of NTG and is insufficient for plaque evaluation. Severe 3-vessel coronary calcifications noted. LAD stent present. Right Atrium: Right atrial size is dilated. Right Ventricle: The right ventricular cavity is dilated. Left Atrium: Left atrial size is dilated with no left atrial appendage filling defect. Small PFO. Left Ventricle: The ventricular cavity size is severely dilated. There is global hypokinesis with evidence of akinesis of the anterior wall, and septum consistent with prior LAD infarction. Pulmonary arteries: Normal in size. Pulmonary veins: Normal pulmonary venous drainage. Pericardium: Normal thickness with no significant effusion or calcium present. Mitral Valve: The mitral valve is normal structure with mild annular calcium. Aorta: Normal caliber of the ascending aorta and aortic root. No evidence of dissection. Extra-cardiac findings: See attached radiology report for non-cardiac structures. IMPRESSION: 1. Heartmate 3 LVAD is present. Patency noted of the inflow cannula and outflow graft. No evidence of LVAD thrombosis. 2. Aortic valve  prosthesis with evidence of valve thrombosis. 3. Severely dilated left ventricle with evidence of large LAD infarction. 4. Small PFO. [place] T. Audie Box, MD Electronically Signed   By: Eleonore Chiquito M.D.   On: 05/10/2022 16:32   Result Date: 05/10/2022 EXAM: OVER-READ INTERPRETATION  CT CHEST The following report is a limited chest CT over-read performed by radiologist Dr. Marijo Conception of [facility], [place] on 05/09/2022. This over-read does not include interpretation of cardiac or coronary anatomy or pathology. The coronary CTA interpretation by the cardiologist is attached. COMPARISON:  May 06, 2022. April 30, 2022. March 11, 2021. FINDINGS: Vascular: Atherosclerosis of visualized thoracic aorta is noted. Left ventricular assist device is noted. Mediastinum/Nodes: Visualized mediastinum is unremarkable. Lungs/Pleura: 7 mm nodule is noted in left upper lobe best seen on image number 8 of series 10. This is not clearly visualized on prior exam of March 11, 2021. Upper Abdomen: No acute abnormality. Musculoskeletal: No chest wall mass or suspicious bone lesions identified. IMPRESSION: 7 mm nodule is noted in left upper lobe that was not present in 2022, and is concerning for malignancy. This was previously described on the CT scan of April 30, 2022. As noted on that CT scan, additional imaging evaluation or consultation with pulmonology or thoracic surgery is recommended. These results will be called to the ordering clinician or representative by the Radiologist Assistant, and communication documented in the PACS or zVision Dashboard. Electronically Signed: By: Marijo Conception M.D. On: 05/09/2022 13:27   CT ANGIO CHEST AORTA W/CM & OR WO/CM  Result Date: 05/09/2022 CLINICAL DATA:  Status post aortic valve repair. EXAM: CT ANGIOGRAPHY CHEST WITH CONTRAST TECHNIQUE: Multidetector CT imaging of the chest was performed using the standard protocol during bolus administration of intravenous  contrast. Multiplanar CT image reconstructions and MIPs were obtained to evaluate the vascular anatomy. RADIATION DOSE REDUCTION: This exam was performed according to the departmental dose-optimization program which includes automated exposure control, adjustment of the mA and/or kV according to patient size and/or use of iterative reconstruction technique. CONTRAST:  131mL OMNIPAQUE IOHEXOL 350 MG/ML SOLN COMPARISON:  May 06, 2022. April 30, 2022. March 11, 2021. FINDINGS: Cardiovascular: Status post aortic valve repair. Also status post left ventricular assist device with inflow graft flowing from ascending thoracic aorta. Atherosclerosis of thoracic aorta is noted without dissection. Visualized great vessels are widely patent. Coronary artery calcifications are noted. Mediastinum/Nodes: No enlarged mediastinal, hilar, or axillary lymph nodes. Thyroid gland, trachea, and esophagus demonstrate no significant findings. Lungs/Pleura: The lung fields are not completely visualized as the lung apices are not included in the field-of-view. Similar disease is noted. No pleural effusion is noted. There is again noted 7 mm irregular nodule in the left upper lobe best seen on image number 9 of series 3 concerning for malignancy as it was not present in 2022. Upper Abdomen: No acute abnormality. Musculoskeletal: No acute abnormality seen involving the visualized skeleton. Review of the MIP images confirms the above findings. IMPRESSION: Status post aortic valve repair and left ventricular assist device placement. Coronary artery calcifications are noted suggesting coronary disease. Lung fields are not completely visualized as the lung apices are not included in field-of-view. There is again noted 7 mm irregular nodule in left upper lobe which was not present in 2022 and is concerning for malignancy. As noted on prior CT scan of April 30, 2022, consultation with pulmonology and thoracic surgery is recommended.  Aortic Atherosclerosis (ICD10-I70.0) and Emphysema (ICD10-J43.9). Electronically Signed   By: Marijo Conception M.D.   On: 05/09/2022 13:42     Medications:     Scheduled Medications:  aspirin EC  81 mg Oral Daily   atorvastatin  80 mg Oral QHS   ezetimibe  10 mg Oral Daily   guaiFENesin  600 mg Oral BID   losartan  12.5 mg Oral Daily   melatonin  5 mg Oral QHS   metoprolol succinate  12.5 mg Oral Daily   pantoprazole  40 mg Oral BID   sertraline  100 mg Oral Daily   sodium chloride flush  3 mL Intravenous Q12H   tamsulosin  0.4 mg Oral Daily   Warfarin - Pharmacist Dosing Inpatient   Does not apply q1600    Infusions:  sodium chloride Stopped (05/08/22 1201)   sodium chloride      PRN Medications: sodium chloride, acetaminophen, dextromethorphan-guaiFENesin, ondansetron (ZOFRAN) IV, mouth rinse,  sodium chloride, sodium chloride flush   Assessment/Plan:   1. Anterior STEMI - Recent VF requiring emergent DCCV - HS troponin > 24K - R/LHC - stable CAD, low filling pressures, normal CO. ?evidence of pre-existing aortic root dissection around AVR - CTA chest - no dissection flap or evidence of pump thrombosis - TEE 12/26: EF 20%, normal aortic valve prosthesis, minimal opening, no AI - Continue 12.5 metoprolol succinate daily.  - Cardiac CT yesterday with concerns for acute on chronic aortic valve thrombus, suspect cardio-embolus to LAD triggering admission.  - Will plan to continue heparin until INR > = 2.0 then run INR 2.5-3.0 with ASA 81. We discussed possible low-dose t-pa protocol but felt that as valve not opening much this was likely not best option as thrombus likely to recur.  Can stop heparin gtt today with INR 2.1.    2. Acute on Chronic systolic HFr EF due to iCM in setting of STEMI - Echo 05/19/20 EF 20-25% RV mildly HK. moderate AS  Mean gradient 13 AVA 1.2 cm2 DI 0.30 - s/p HM-III VAD + bioprosthetic AVR 04/06/21 - Baseline NYHA I.  - Volume stable on assessment,  frequent PIs so will not restart torsemide at this time.  - Continue losartan 12.5 daily, MAP stable.    3. HM-3 LVAD implant - VAD interrogated personally. See HPI - INR 2.1, goal INR will be 2.5-3 with ASA 81 given thrombus on aortic valve (rarely opens).  - can stop heparin gtt today as above.  - LDH 273>240>243>213>198 - Still frequent PI events but fewer than the day before.     4. Severe low-flow aortic stenosis s/p AVR - s/p VAD/bioprosthetic AVR on 04/06/21   5. CAD  - s/p anterior STEMI (12/20). LHC showed chronically occluded RCA (with L>>R collaterals) and thrombotic occlusion of proximal LAD. Underwent PCI of LAD. - Now s/p VAD - Continue statin - see #1  6. VF - VF 12/23 s/p emergent DC-CV - Discussed with EP, would need ICD if has recurrent VT/VF   7. Paroxysmal Atrial Fibrillation  - Maintaining SR - Off amio   8. 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  - Bcx recollected and pending  - Tissue sent to [facility] in [place] for broad 16 S ribosomal sequencing for bacterial pathogens as well as T wet Bhilai Bartonella Brucella  - Ceftriaxone 2 g IV daily x 6 weeks. Completed May 22, 2021.  - ICD removed - No infectious sx. WBC ok    9. Ankylosing spondylitis - His local rheumatologist is hesitant to start remicade with VAD/infection risk - D/w [name] VAD program they have recommended f/u with Veva Holes, MD for 2nd opinion if needed.  - Currently feeling ok without it - PCP managing pain meds  10. Covid + 12/27 - antivirals discussed with pharmD, none required - Symptoms improving  Walk in halls today.  If INR trending up, can probably go home tomorrow.    I reviewed the LVAD parameters from today, and compared the results to the patient's prior recorded data.  No programming changes were made.  The LVAD is functioning within specified parameters.  The patient  performs LVAD self-test daily.  LVAD interrogation was negative for any significant power changes, alarms or PI events/speed drops.  LVAD equipment check completed and is in good working order.  Back-up equipment present.   LVAD education done on emergency procedures and  precautions and reviewed exit site care.  Length of Stay: [place], MD 05/11/2022, 9:29 AM  VAD Team --- VAD ISSUES ONLY--- Pager [phone] (7am - 7am)  Advanced Heart Failure Team  Pager [phone] (M-F; 7a - 5p)  Please contact [facility] for night-coverage after hours (5p -7a ) and weekends on [email or web address]

## 2022-05-12 ENCOUNTER — Other Ambulatory Visit (HOSPITAL_COMMUNITY): Payer: Self-pay | Admitting: Cardiology

## 2022-05-12 DIAGNOSIS — I5023 Acute on chronic systolic (congestive) heart failure: Secondary | ICD-10-CM | POA: Diagnosis not present

## 2022-05-12 DIAGNOSIS — U071 COVID-19: Secondary | ICD-10-CM | POA: Insufficient documentation

## 2022-05-12 DIAGNOSIS — Z952 Presence of prosthetic heart valve: Secondary | ICD-10-CM

## 2022-05-12 DIAGNOSIS — I214 Non-ST elevation (NSTEMI) myocardial infarction: Secondary | ICD-10-CM | POA: Diagnosis not present

## 2022-05-12 DIAGNOSIS — R911 Solitary pulmonary nodule: Secondary | ICD-10-CM | POA: Insufficient documentation

## 2022-05-12 DIAGNOSIS — Z95811 Presence of heart assist device: Secondary | ICD-10-CM | POA: Diagnosis not present

## 2022-05-12 DIAGNOSIS — Z8679 Personal history of other diseases of the circulatory system: Secondary | ICD-10-CM

## 2022-05-12 LAB — PROTIME-INR
INR: 2.3 — ABNORMAL HIGH (ref 0.8–1.2)
Prothrombin Time: 25 seconds — ABNORMAL HIGH (ref 11.4–15.2)

## 2022-05-12 LAB — LACTATE DEHYDROGENASE: LDH: 205 U/L — ABNORMAL HIGH (ref 98–192)

## 2022-05-12 LAB — MAGNESIUM: Magnesium: 2 mg/dL (ref 1.7–2.4)

## 2022-05-12 MED ORDER — METOPROLOL SUCCINATE ER 25 MG PO TB24: 12.5000 mg | ORAL_TABLET | Freq: Every day | ORAL | 1 refills | Status: DC

## 2022-05-12 MED ORDER — WARFARIN SODIUM 2.5 MG PO TABS: ORAL_TABLET | ORAL | 1 refills | Status: DC

## 2022-05-12 MED ORDER — ASPIRIN 81 MG PO TBEC: 81.0000 mg | DELAYED_RELEASE_TABLET | Freq: Every day | ORAL | 1 refills | Status: DC

## 2022-05-12 NOTE — Progress Notes (Signed)
Patient ID: Brandon [name], male   DOB: [birth year], 66 y.o.   MRN: [id number]   Advanced Heart Failure VAD Team Note  PCP-Cardiologist: [name], MD   Subjective:    12/25 Presented with CP and anterior ST elevation 12/25: R/LHC with stable CAD, low filling pressures with normal CO. ? Dissection flap around AVR CTA chest 12/25: No evidence of dissection flap TEE 12/26: EF 20%, normal bioprosthetic AVR with minimal opening, no AI  Central access pulled 12/27. Covid + 12/27, had congestion, dyspnea, body aches and chills with recent exposure.   Cardiac CT 12/28: concern for acute on chronic aortic valve thrombus. Suspect that he likely had thrombus off AoV down LAD.  Feeling better overall.  No cough, mild rhinorrhea.  Walked in hall yesterday.  INR up to 2.3, off heparin gtt.   LVAD INTERROGATION:  HeartMate III LVAD:   Flow 5.1 liters/min, speed 5800, power 5, PI 3.8.   Objective:    Vital Signs:   Temp:  [97.7 F (36.5 C)-98 F (36.7 C)] 97.8 F (36.6 C) (12/31 0816) Pulse Rate:  [79-83] 79 (12/31 0816) Resp:  [16-19] 17 (12/31 0816) BP: (97-111)/(65-84) 98/79 (12/31 0816) SpO2:  [97 %-98 %] 97 % (12/31 0816) Weight:  [89.1 kg] 89.1 kg (12/31 0439) Last BM Date : 05/11/22 Mean arterial Pressure 87  Intake/Output:   Intake/Output Summary (Last 24 hours) at 05/12/2022 0913 Last data filed at 05/11/2022 1200 Gross per 24 hour  Intake 400 ml  Output 450 ml  Net -50 ml     Physical Exam    General: Well appearing this am. NAD.  HEENT: Normal. Neck: Supple, JVP 7-8 cm. Carotids OK.  Cardiac:  Mechanical heart sounds with LVAD hum present.  Lungs:  CTAB, normal effort.  Abdomen:  NT, ND, no HSM. No bruits or masses. +BS  LVAD exit site: Well-healed and incorporated. Dressing dry and intact. No erythema or drainage. Stabilization device present and accurately applied. Driveline dressing changed daily per sterile technique. Extremities:  Warm and dry. No cyanosis,  clubbing, rash, or edema.  Neuro:  Alert & oriented x 3. Cranial nerves grossly intact. Moves all 4 extremities w/o difficulty. Affect pleasant    Telemetry  NSR 70s, occasional PVCs (Personally reviewed)   Labs   Basic Metabolic Panel: Recent Labs  Lab 05/07/22 0438 05/08/22 0534 05/09/22 0039 05/10/22 0037 05/11/22 0024 05/12/22 0028  NA 137 135 137 137 138  --   K 3.5 3.7 3.5 3.8 3.6  --   CL 103 104 105 104 106  --   CO2 25 24 23 24 23   --   GLUCOSE 102* 109* 101* 110* 107*  --   BUN 18 22 20 22  24*  --   CREATININE 0.97 0.96 0.94 0.98 0.82  --   CALCIUM 8.6* 8.7* 8.7* 8.9 8.9  --   MG  --  2.0 2.0 2.0 2.1 2.0    Liver Function Tests: Recent Labs  Lab 05/06/22 1902  AST 35  ALT 20  ALKPHOS 123  BILITOT 1.7*  PROT 7.0  ALBUMIN 3.6   No results for input(s): "LIPASE", "AMYLASE" in the last 168 hours. No results for input(s): "AMMONIA" in the last 168 hours.  CBC: Recent Labs  Lab 05/06/22 1902 05/06/22 2023 05/07/22 0438 05/08/22 0534 05/09/22 0039 05/10/22 0037 05/11/22 0024  WBC 15.1*  --  9.3 8.1 6.1 6.4 8.2  NEUTROABS 13.3*  --   --   --   --   --   --  HGB 13.8   < > 13.5 12.8* 13.2 13.4 13.0  HCT 43.8   < > 41.4 41.2 41.4 41.9 40.8  MCV 82.8  --  80.9 82.9 83.0 81.0 80.6  PLT 186  --  184 175 172 183 193   < > = values in this interval not displayed.    INR: Recent Labs  Lab 05/08/22 0534 05/09/22 0039 05/10/22 0037 05/11/22 0024 05/12/22 0028  INR 1.5* 1.5* 1.8* 2.1* 2.3*    Other results:    Imaging   No results found.   Medications:     Scheduled Medications:  aspirin EC  81 mg Oral Daily   atorvastatin  80 mg Oral QHS   ezetimibe  10 mg Oral Daily   guaiFENesin  600 mg Oral BID   losartan  12.5 mg Oral Daily   melatonin  5 mg Oral QHS   metoprolol succinate  12.5 mg Oral Daily   pantoprazole  40 mg Oral BID   sertraline  100 mg Oral Daily   sodium chloride flush  3 mL Intravenous Q12H   tamsulosin  0.4 mg Oral  Daily   Warfarin - Pharmacist Dosing Inpatient   Does not apply q1600    Infusions:  sodium chloride Stopped (05/08/22 1201)   sodium chloride      PRN Medications: sodium chloride, acetaminophen, dextromethorphan-guaiFENesin, ondansetron (ZOFRAN) IV, mouth rinse, sodium chloride, sodium chloride flush   Assessment/Plan:   1. Anterior STEMI - Recent VF requiring emergent DCCV - HS troponin > 24K - R/LHC - stable CAD, low filling pressures, normal CO. ?evidence of pre-existing aortic root dissection around AVR - CTA chest - no dissection flap or evidence of pump thrombosis - TEE 12/26: EF 20%, normal aortic valve prosthesis, minimal opening, no AI - Continue 12.5 metoprolol succinate daily.  - Cardiac CT with concerns for acute on chronic aortic valve thrombus, suspect cardio-embolus to LAD triggering admission.  - Will plan to run INR 2.5-3.0 with addition of ASA 81. We discussed possible low-dose t-pa protocol but felt that as valve not opening much this was likely not best option as thrombus likely to recur.  INR up to 2.3 today, heparin stopped after INR was > 2.    2. Acute on Chronic systolic HFr EF due to iCM in setting of STEMI - Echo 05/19/20 EF 20-25% RV mildly HK. moderate AS  Mean gradient 13 AVA 1.2 cm2 DI 0.30 - s/p HM-III VAD + bioprosthetic AVR 04/06/21 - Baseline NYHA I.  - Volume stable on assessment, frequent PIs so will not restart torsemide at this time.  - Continue losartan 12.5 daily, MAP stable.    3. HM-3 LVAD implant - VAD interrogated personally. See HPI - INR 2.3, goal INR will be 2.5-3 with ASA 81 given thrombus on aortic valve (rarely opens).  - LDH 273>240>243>213>198>205 - Still frequent PI events but decreasing    4. Severe low-flow aortic stenosis s/p AVR - s/p VAD/bioprosthetic AVR on 04/06/21   5. CAD  - s/p anterior STEMI (12/20). LHC showed chronically occluded RCA (with L>>R collaterals) and thrombotic occlusion of proximal LAD. Underwent  PCI of LAD. - Now s/p VAD - Continue statin - see #1  6. VF - VF 12/23 s/p emergent DC-CV - Discussed with EP, would need ICD if has recurrent VT/VF   7. Paroxysmal Atrial Fibrillation  - Maintaining SR - Off amio   8. Possible "acute AoV endocarditis" - s/p bioprosthetic AVR - Surgical path of native  AoV with evidence of possible "acute AoV endocarditis".  - Bcx recollected and pending  - Tissue sent to [facility] in [place] for broad 16 S ribosomal sequencing for bacterial pathogens as well as T wet Bhilai Bartonella Brucella  - Ceftriaxone 2 g IV daily x 6 weeks. Completed May 22, 2021.  - ICD removed - No infectious sx. WBC ok    9. Ankylosing spondylitis - His local rheumatologist is hesitant to start remicade with VAD/infection risk - D/w [name] VAD program they have recommended f/u with Veva Holes, MD for 2nd opinion if needed.  - Currently feeling ok without it - PCP managing pain meds  10. Covid + 12/27 - antivirals discussed with pharmD, none required - Symptoms improving  Plan for home today.  Close followup in LVAD clinic.  Cardiac meds for home: warfarin with increased INR goal 2.5-3, add ASA 81 daily, will use torsemide 20 mg only prn (not scheduled), restart home eplerenone 50 mg daily, losartan 12.5 daily, Toprol XL 12.5 daily, atorvastatin 80 daily, Zetia 10 daily.    I reviewed the LVAD parameters from today, and compared the results to the patient's prior recorded data.  No programming changes were made.  The LVAD is functioning within specified parameters.  The patient performs LVAD self-test daily.  LVAD interrogation was negative for any significant power changes, alarms or PI events/speed drops.  LVAD equipment check completed and is in good working order.  Back-up equipment present.   LVAD education done on emergency procedures and precautions and reviewed exit site care.  Length of Stay: 6  Loralie Champagne, MD 05/12/2022, 9:13 AM  VAD Team --- VAD ISSUES  ONLY--- Pager [phone] (7am - 7am)  Advanced Heart Failure Team  Pager [phone] (M-F; 7a - 5p)  Please contact [facility] for night-coverage after hours (5p -7a ) and weekends on [email or web address]

## 2022-05-12 NOTE — Discharge Summary (Addendum)
Discharge Summary    Patient ID: Brandon [name] MRN: [id number]; DOB: Aug 20, 1955  Admit date: 05/06/2022 Discharge date: 05/12/2022  PCP:  Brandon Ruddy, MD   [place] [facility] Cardiologist:  Brandon Ruths, MD  Electrophysiologist:  Brandon Grayer, MD  Advanced Heart Failure:  Brandon Bickers, MD       Discharge Diagnoses    Principal Problem:   Acute ST elevation myocardial infarction (STEMI) due to occlusion of left anterior descending (LAD) coronary artery [facility]) Active Problems:   CAD (coronary artery disease)   Acute on chronic systolic (congestive) heart failure (Brandon [name])   Presence of left ventricular assist device (LVAD) (Brandon [name])   VF (ventricular fibrillation) (Brandon [name])   Aortic valve stenosis   S/P AVR (aortic valve replacement)   History of endocarditis   COVID-19 virus infection   Lung nodule    Diagnostic Studies/Procedures    Cor CT 05/09/22 TECHNIQUE: A non-contrast, gated CT scan was obtained with axial slices of 3 mm through the heart for aortic valve calcium scoring. A 120 kV retrospective, gated, contrast cardiac scan was obtained. Gantry rotation speed was 250 msecs and collimation was 0.6 mm. Nitroglycerin was not given. The 3D data set was reconstructed in 5% intervals of the 0-95% of the R-R cycle. Systolic and diastolic phases were analyzed on a dedicated workstation using MPR, MIP, and VRT modes. The patient received 100 cc of contrast.   FINDINGS: Image quality: Excellent.   Noise artifact is: Limited.   LVAD: A Heartmate 3 is present. The inflow cannula is present at the LV apex and is patent. The outflow graft is patent and terminates in the ascending aorta.   Aortic prosthesis: A 25 mm Edwards Inspiris pericardial bioprosthetic valve is present in the aortic position. The valve leaflets are thickened and the leaflets do not open. The HU suggest acute on chronic valve thrombosis.   Coronary Arteries: Normal coronary origin.  Right dominance. The study was performed without use of NTG and is insufficient for plaque evaluation. Severe 3-vessel coronary calcifications noted. LAD stent present.   Right Atrium: Right atrial size is dilated.   Right Ventricle: The right ventricular cavity is dilated.   Left Atrium: Left atrial size is dilated with no left atrial appendage filling defect. Small PFO.   Left Ventricle: The ventricular cavity size is severely dilated. There is global hypokinesis with evidence of akinesis of the anterior wall, and septum consistent with prior LAD infarction.   Pulmonary arteries: Normal in size.   Pulmonary veins: Normal pulmonary venous drainage.   Pericardium: Normal thickness with no significant effusion or calcium present.   Mitral Valve: The mitral valve is normal structure with mild annular calcium.   Aorta: Normal caliber of the ascending aorta and aortic root. No evidence of dissection.   Extra-cardiac findings: See attached radiology report for non-cardiac structures.   IMPRESSION: 1. Heartmate 3 LVAD is present. Patency noted of the inflow cannula and outflow graft. No evidence of LVAD thrombosis.   2. Aortic valve prosthesis with evidence of valve thrombosis.   3. Severely dilated left ventricle with evidence of large LAD infarction.   4. Small PFO.   Brandon Bells T. Audie Box, MD     Electronically Signed   By: Brandon [name] M.D.   On: 05/10/2022 16:32  Overread  IMPRESSION: 7 mm nodule is noted in left upper lobe that was not present in 2022, and is concerning for malignancy. This was previously described on the CT scan of April 30, 2022. As noted on that CT scan, additional imaging evaluation or consultation with pulmonology or thoracic surgery is recommended. These results will be called to the ordering clinician or representative by the Radiologist Assistant, and communication documented in the PACS or zVision Dashboard.   Electronically  Signed: By: Brandon [name] M.D. On: 05/09/2022 13:27   Cardiac Cath 05/06/22   Prox Cx lesion is 50% stenosed.   Prox RCA to Mid RCA lesion is 100% stenosed.   Ost RCA to Prox RCA lesion is 70% stenosed.   2nd Diag lesion is 90% stenosed.   Dist LAD lesion is 50% stenosed.   Non-stenotic Mid LAD lesion was previously treated.   Findings:   Ao = 93/83 (88) RA = 1 RV = 34/5 PA = 28/18 (22) PCW = 5 Fick cardiac output/index = 4.8/2.2 TD CO/CI = 6.0/2.8 PVR = 3.0 WU Ao sat = 92% PA sat = 60%   Assessment:   1. Stable CAD with evidence of pre-existing aortic root dissection around AVR 2. Low filling pressures with normal cardiac output   Plan/Discussion:   Stat CTA chest to evaluate dissection. Discussed with Dr. Tenny [name] in [place].   Brandon Bickers, MD  9:51 PM   _____________   History of Present Illness     Brandon [name] is a 66 y.o. male  (originally from [place], [place]) with h/o CAD s/p previous MI, severe AS s/p bioprosthetic AVR 62/1308 complicated by possible AoV endocarditis tx with 6 weeks abx,  chronic systolic HF s/p HM-3 VAD in 03/2021, PAF during 2022 admission, ankylosing spondylitis, VF who presented to the hospital with CP and ST elevation.  Per prior history, was previously admitted 04/27/19 with anterior ST elevation. Taken to cath lab which showed chronically occluded RCA (L>>R collaterals) and thrombotic occlusion of proximal LAD. Underwent PCI of LAD. Echo w/ reduced LVEF 30-35% w/ apical aneurysm. RV ok. Post cath, he required milrinone for cardiogenic shock. Underwent outpatient [facility] 11/29/20 as part of VAD w/u. Post procedure, developed severe rigors and fever. Admitted to ICU Blood Cx + strep. 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  Likely low-flow low gradient AS. Underwent ICD extraction. He was admitted for cardiogenic shock in 02/2021. He underwent placement of HM3 VAD and aortic valve replacement  with 25 mm Edwards Inspiris Resilia valve on 04/06/21. Surgical path came back 11/28 with evidence of possible "acute aortic valve endocarditis". Started on IV ceftriaxone x 6 weeks to cover Streptococcus gordonae that was isolated previously. Tissue sent to [facility] in [place] for broad 16 S ribosomal sequencing for bacterial pathogens as well as T wet Bhilai Bartonella Brucella. Otherwise had complex hospital course including electrolyte abnormalities, severe protein calorie malnutrition, PAF, hemorrhagic pericarditis, Covid infection, and pleural effusions requiring thoracentesis.  More recently he presented to the ER on 04/22/22 with VF requiring emergent DCCV and shocked back to NSR. No CP or ECG changes at the time. It was suspected possibly due to dehydration/hypotension. He was discharged 04/23/22.  He returned to the ED 05/06/22 with severe CP, nausea and SOB. Called EMS. Initial ECG in field with massive ST elevation. Brought to ER with waxing/waning CP and shoulder pain. ECG with improved ST elevation. Initial MAP 90s, CXR with pulmonary edema. He was given ASA, IV Lasix and IV NTG. He underwent L/RHC which showed stable CAD, no culprit lesion seen on cath, ? Dissection flap around AVR. Suspected thrombotic event from AoV. Underwent TEE  which showed EF 20%, normal bioprosthetic AVR with minimal opening, no AI. Underwent chest CT which was reviewed by Dr. [name] and Dr. Darcey Nora, no dissection flap but there was concern for possible penetrating ulcer at aortic root/origin LM. Cardiac CT showed no evidence of dissection flap. Subsequently admitted to advanced HF service.  Hospital Course     1. Anterior STEMI - Recent VF requiring emergent DCCV prior to this admission - here, HS troponin > 24K - R/LHC - stable CAD, low filling pressures, normal CO. ?evidence of pre-existing aortic root dissection around AVR - CTA chest - no dissection flap or evidence of pump thrombosis - TEE 12/26: EF 20%, normal  aortic valve prosthesis, minimal opening, no AI - Cardiac CT with concerns for acute on chronic aortic valve thrombus, suspect cardio-embolus to LAD triggering admission.  - Started on metoprolol 12.62m daily - Per MD plan to run INR 2.5-3.0 with addition of ASA 81. We discussed possible low-dose t-pa protocol but felt that as valve not opening much this was likely not best option as thrombus likely to recur.  INR up to 2.3 today, heparin stopped after INR was > 2.  - per d/w pharmD J Carney re: warfarin, would prescribe 7.5 mg every day for now with INR check maybe Tues/Wed of this week -she sent msg to LAudry Riles(she does the VAD INRs) to let her know  - aspirin 871mdaily added - has mild bruising, swelling proximal to cath site on underarm of forearm in early stages of resolution, no overt hematoma or pulsatile mass. Per d/w Dr. McAundra Dubinno specific intervention needed, please follow as OP - Dr. McAundra Dubineels patient OK to resume driving, did outline standard STEMI instructions otherwise on AVS - defer SL NTG rx to clinic in follow-up with baseline BP   2. Acute on Chronic systolic HFr EF due to iCM in setting of STEMI - Echo 05/19/20 EF 20-25% RV mildly HK. moderate AS  Mean gradient 13 AVA 1.2 cm2 DI 0.30 - s/p HM-III VAD + bioprosthetic AVR 04/06/21 - Baseline NYHA I.  - Volume stable on assessment, frequent PIs so will not restart scheduled torsemide at this time -> continue only PRN per Dr McAundra Dubinwas written this way on PTA MAR - Continue losartan 12.5 daily, MAP stable.  - Resume home epleronone and potassium chloride 2043mdaily at discharge per d/w MD - metoprolol added this admission   3. HM-3 LVAD implant - VAD interrogated personally by MD. See HPI - INR 2.3, goal INR will be 2.5-3 with ASA 81 given thrombus on aortic valve (rarely opens).  - LDH 273>240>243>213>198>205 - Still frequent PI events but decreasing  - nurse to change dressing per MD prior to dc since pt's caregiver  also has Covid   4. Severe low-flow aortic stenosis s/p AVR - s/p VAD/bioprosthetic AVR on 04/06/21   5. CAD  - s/p anterior STEMI (12/20). LHC showed chronically occluded RCA (with L>>R collaterals) and thrombotic occlusion of proximal LAD. Underwent PCI of LAD. - cath this admission as above - Now s/p VAD - Continue statin - see #1   6. Recent VF - VF earlier in Dec 2023 s/p emergent DC-CV - metoprolol added this admission - Case discussed with EP, would need ICD if has recurrent VT/VF   7. Prior h/o paroxysmal Atrial Fibrillation  - Maintaining SR - Remains off amiodarone - On warfarin   8. H/o possible "acute AoV endocarditis" - s/p bioprosthetic AVR - Prior surgical  path of native AoV with evidence of possible "acute AoV endocarditis".  - Tissue sent to [facility] in [place] for broad 16 S ribosomal sequencing for bacterial pathogens as well as T wet Bhilai Bartonella Brucella  - Previously received Ceftriaxone 2 g IV daily x 6 weeks. Completed May 22, 2021.  - ICD previously removed - WBC ok this admission - infective sx below felt due to Covid   9. Ankylosing spondylitis - His local rheumatologist is hesitant to start remicade with VAD/infection risk - D/w [name] VAD program they have recommended f/u with Veva Holes, MD for 2nd opinion if needed.  - Currently feeling ok without it - PCP managing pain meds   10. Covid + 12/27 - on 05/08/22, tested due to worsening dyspnea, congestion, body aches and chills and exposure to sister who was recently sick - antivirals discussed with pharmD, none required - symptoms improving - patient advised to mask through 10 days total  11. Lung nodule - CT overread demonstrated "7 mm nodule is noted in left upper lobe that was not present in 2022, and is concerning for malignancy. This was previously described on the CT scan of April 30, 2022. As noted on that CT scan, additional imaging evaluation or consultation with pulmonology or  thoracic surgery is recommended" - also seen on OP lung CA screening CT ordered by Dr. Grier Mitts 04/30/22 - patient aware of findings, he had reviewed on MyChart - he plans to contact PCP on Tuesday about next steps  Dr. Aundra Dubin has seen and examined the patient today and feels he is stable for discharge. He reports that the LVAD nurse will take care of INR and will arrange follow-up for him.   Of note, patient has tramadol rx on MAR from PCP, listed to take 1-2 tablets twice a day for 30 days. He has been taking this 1-2 tablets twice daily as needed instead of scheduled. He was instructed he may continue to take this as he is doing - we did not adjust the prescription so that we did not disrupt the original prescription trail from primary care.      Did the patient have an acute coronary syndrome (MI, NSTEMI, STEMI, etc) this admission?:  Yes                               AHA/ACC Clinical Performance & Quality Measures: Aspirin prescribed? - Yes ADP Receptor Inhibitor (Plavix/Clopidogrel, Brilinta/Ticagrelor or Effient/Prasugrel) prescribed (includes medically managed patients)? - No - felt due to thrombus, on warfarin Beta Blocker prescribed? - Yes High Intensity Statin (Lipitor 40-48m or Crestor 20-462m prescribed? - Yes EF assessed during THIS hospitalization? - Yes TEE For EF <40%, was ACEI/ARB prescribed? - Yes For EF <40%, Aldosterone Antagonist (Spironolactone or Eplerenone) prescribed? - Yes Cardiac Rehab Phase II ordered (including medically managed patients)? - Yes       The patient will be scheduled for a TOC follow up appointment by the Heart Failure VAD team.  Discharge Vitals Blood pressure 98/79, pulse 79, temperature 97.8 F (36.6 C), temperature source Oral, resp. rate 17, height 6' (1.829 m), weight 89.1 kg, SpO2 97 %.  Filed Weights   05/10/22 0506 05/11/22 0417 05/12/22 0439  Weight: 88.9 kg 88.9 kg 89.1 kg    Labs & Radiologic Studies    CBC Recent  Labs    05/10/22 0037 05/11/22 0024  WBC 6.4 8.2  HGB 13.4 13.0  HCT  41.9 40.8  MCV 81.0 80.6  PLT 183 299   Basic Metabolic Panel Recent Labs    05/10/22 0037 05/11/22 0024 05/12/22 0028  NA 137 138  --   K 3.8 3.6  --   CL 104 106  --   CO2 24 23  --   GLUCOSE 110* 107*  --   BUN 22 24*  --   CREATININE 0.98 0.82  --   CALCIUM 8.9 8.9  --   MG 2.0 2.1 2.0   Liver Function Tests No results for input(s): "AST", "ALT", "ALKPHOS", "BILITOT", "PROT", "ALBUMIN" in the last 72 hours. No results for input(s): "LIPASE", "AMYLASE" in the last 72 hours. High Sensitivity Troponin:   Recent Labs  Lab 04/23/22 0021 04/23/22 0806 05/06/22 1902 05/06/22 2305 05/07/22 0438  TROPONINIHS 335* 437* 48* 8,627* >24,000*    _____________  CT CORONARY MORPH W/CTA COR W/SCORE W/CA W/CM &/OR WO/CM  Addendum Date: 05/10/2022   ADDENDUM REPORT: 05/10/2022 16:32 CLINICAL DATA:  Ventricular Fibrillation. S/p Heartmate 3 LVAD with aortic valve replacement 04/06/2021. EXAM: Retrospective cardiac CT TECHNIQUE: A non-contrast, gated CT scan was obtained with axial slices of 3 mm through the heart for aortic valve calcium scoring. A 120 kV retrospective, gated, contrast cardiac scan was obtained. Gantry rotation speed was 250 msecs and collimation was 0.6 mm. Nitroglycerin was not given. The 3D data set was reconstructed in 5% intervals of the 0-95% of the R-R cycle. Systolic and diastolic phases were analyzed on a dedicated workstation using MPR, MIP, and VRT modes. The patient received 100 cc of contrast. FINDINGS: Image quality: Excellent. Noise artifact is: Limited. LVAD: A Heartmate 3 is present. The inflow cannula is present at the LV apex and is patent. The outflow graft is patent and terminates in the ascending aorta. Aortic prosthesis: A 25 mm Edwards Inspiris pericardial bioprosthetic valve is present in the aortic position. The valve leaflets are thickened and the leaflets do not open. The HU  suggest acute on chronic valve thrombosis. Coronary Arteries: Normal coronary origin. Right dominance. The study was performed without use of NTG and is insufficient for plaque evaluation. Severe 3-vessel coronary calcifications noted. LAD stent present. Right Atrium: Right atrial size is dilated. Right Ventricle: The right ventricular cavity is dilated. Left Atrium: Left atrial size is dilated with no left atrial appendage filling defect. Small PFO. Left Ventricle: The ventricular cavity size is severely dilated. There is global hypokinesis with evidence of akinesis of the anterior wall, and septum consistent with prior LAD infarction. Pulmonary arteries: Normal in size. Pulmonary veins: Normal pulmonary venous drainage. Pericardium: Normal thickness with no significant effusion or calcium present. Mitral Valve: The mitral valve is normal structure with mild annular calcium. Aorta: Normal caliber of the ascending aorta and aortic root. No evidence of dissection. Extra-cardiac findings: See attached radiology report for non-cardiac structures. IMPRESSION: 1. Heartmate 3 LVAD is present. Patency noted of the inflow cannula and outflow graft. No evidence of LVAD thrombosis. 2. Aortic valve prosthesis with evidence of valve thrombosis. 3. Severely dilated left ventricle with evidence of large LAD infarction. 4. Small PFO. Brandon Bells T. Audie Box, MD Electronically Signed   By: Brandon [name] M.D.   On: 05/10/2022 16:32   Result Date: 05/10/2022 EXAM: OVER-READ INTERPRETATION  CT CHEST The following report is a limited chest CT over-read performed by radiologist Dr. Marijo [name] of [facility] [place] on 05/09/2022. This over-read does not include interpretation of cardiac or coronary anatomy or pathology. The coronary  CTA interpretation by the cardiologist is attached. COMPARISON:  May 06, 2022. April 30, 2022. March 11, 2021. FINDINGS: Vascular: Atherosclerosis of visualized thoracic aorta is noted. Left  ventricular assist device is noted. Mediastinum/Nodes: Visualized mediastinum is unremarkable. Lungs/Pleura: 7 mm nodule is noted in left upper lobe best seen on image number 8 of series 10. This is not clearly visualized on prior exam of March 11, 2021. Upper Abdomen: No acute abnormality. Musculoskeletal: No chest wall mass or suspicious bone lesions identified. IMPRESSION: 7 mm nodule is noted in left upper lobe that was not present in 2022, and is concerning for malignancy. This was previously described on the CT scan of April 30, 2022. As noted on that CT scan, additional imaging evaluation or consultation with pulmonology or thoracic surgery is recommended. These results will be called to the ordering clinician or representative by the Radiologist Assistant, and communication documented in the PACS or zVision Dashboard. Electronically Signed: By: Brandon [name] M.D. On: 05/09/2022 13:27   CT ANGIO CHEST AORTA W/CM & OR WO/CM  Result Date: 05/09/2022 CLINICAL DATA:  Status post aortic valve repair. EXAM: CT ANGIOGRAPHY CHEST WITH CONTRAST TECHNIQUE: Multidetector CT imaging of the chest was performed using the standard protocol during bolus administration of intravenous contrast. Multiplanar CT image reconstructions and MIPs were obtained to evaluate the vascular anatomy. RADIATION DOSE REDUCTION: This exam was performed according to the departmental dose-optimization program which includes automated exposure control, adjustment of the mA and/or kV according to patient size and/or use of iterative reconstruction technique. CONTRAST:  161m OMNIPAQUE IOHEXOL 350 MG/ML SOLN COMPARISON:  May 06, 2022. April 30, 2022. March 11, 2021. FINDINGS: Cardiovascular: Status post aortic valve repair. Also status post left ventricular assist device with inflow graft flowing from ascending thoracic aorta. Atherosclerosis of thoracic aorta is noted without dissection. Visualized great vessels are widely  patent. Coronary artery calcifications are noted. Mediastinum/Nodes: No enlarged mediastinal, hilar, or axillary lymph nodes. Thyroid gland, trachea, and esophagus demonstrate no significant findings. Lungs/Pleura: The lung fields are not completely visualized as the lung apices are not included in the field-of-view. Similar disease is noted. No pleural effusion is noted. There is again noted 7 mm irregular nodule in the left upper lobe best seen on image number 9 of series 3 concerning for malignancy as it was not present in 2022. Upper Abdomen: No acute abnormality. Musculoskeletal: No acute abnormality seen involving the visualized skeleton. Review of the MIP images confirms the above findings. IMPRESSION: Status post aortic valve repair and left ventricular assist device placement. Coronary artery calcifications are noted suggesting coronary disease. Lung fields are not completely visualized as the lung apices are not included in field-of-view. There is again noted 7 mm irregular nodule in left upper lobe which was not present in 2022 and is concerning for malignancy. As noted on prior CT scan of April 30, 2022, consultation with pulmonology and thoracic surgery is recommended. Aortic Atherosclerosis (ICD10-I70.0) and Emphysema (ICD10-J43.9). Electronically Signed   By: JMarijo ConceptionM.D.   On: 05/09/2022 13:42   DG Chest Port 1 View  Result Date: 05/08/2022 CLINICAL DATA:  Shortness of breath EXAM: PORTABLE CHEST 1 VIEW COMPARISON:  05/06/2022 FINDINGS: Left ventricular assist device remains in place. New right internal jugular approach pulmonary arterial catheter terminates within the right proximal pulmonary outflow. Stable heart size status post sternotomy and cardiac valve replacement. Aortic atherosclerosis. Diffuse bilateral interstitial prominence with slightly improved aeration of the lungs compared to prior. No large pleural  fluid collection. No pneumothorax. Nerve stimulator pack overlies  the right chest. IMPRESSION: 1. Diffuse bilateral interstitial prominence with slightly improved aeration of the lungs compared to prior. 2. New right internal jugular approach pulmonary arterial catheter terminates within the right proximal pulmonary outflow. No pneumothorax. Electronically Signed   By: Davina Poke D.O.   On: 05/08/2022 09:27   CT ANGIO CHEST AORTA W/CM & OR WO/CM  Addendum Date: 05/06/2022   ADDENDUM REPORT: 05/06/2022 23:34 ADDENDUM: I discussed these findings with the attending cardiologist and have reviewed several of the intraoperative arteriograms. The reported blush underneath the origin of the left main coronary artery likely reflects contrast within the sinus of Valsalva adjacent to the scaffolding of the artificial aortic valve. There is, however, no dissection flap or extraluminal contrast identified in this region. Electronically Signed   By: Fidela Salisbury M.D.   On: 05/06/2022 23:34   Result Date: 05/06/2022 CLINICAL DATA:  Aortic root dissection EXAM: CT ANGIOGRAPHY CHEST WITH CONTRAST TECHNIQUE: Multidetector CT imaging of the chest was performed using the standard protocol during bolus administration of intravenous contrast. Multiplanar CT image reconstructions and MIPs were obtained to evaluate the vascular anatomy. RADIATION DOSE REDUCTION: This exam was performed according to the departmental dose-optimization program which includes automated exposure control, adjustment of the mA and/or kV according to patient size and/or use of iterative reconstruction technique. CONTRAST:  54m OMNIPAQUE IOHEXOL 350 MG/ML SOLN COMPARISON:  11/21/2020, 04/30/2022 FINDINGS: Cardiovascular: Ventriassist left ventricular assist device is in place. There is thrombosis of the inflow and proximal outflow cannulas of the device, best seen on axial image # 132 and # 136, series 5. Opacification of the terminal outflow cannula may be retrograde in fashion from the ascending aorta. Global  cardiac size is within normal limits with left ventricular dilation and marked thinning of the left ventricular apex and septal walls again noted. Extensive multi-vessel coronary artery calcification. No pericardial effusion. Aortic valve replacement and tube graft repair of the ascending thoracic aorta has been performed. No thoracic aortic intramural hematoma, dissection, or aneurysm. Moderate atherosclerotic calcification within the descending thoracic aorta. Arch vasculature demonstrates variant anatomy with separate origin of the left vertebral artery directly from the aorta which appears diminutive in nature. Arch vasculature is otherwise unremarkable. Left internal jugular Swan-Ganz catheter tip noted within the origin of the right lower lobar pulmonary artery. Mediastinum/Nodes: Visualize thyroid is unremarkable. No pathologic thoracic adenopathy. Esophagus unremarkable. No mediastinal hematoma or pneumomediastinum. Lungs/Pleura: Mild emphysema. Multifocal airspace infiltrates are noted within the dependent right upper lobe and lower lobes bilaterally which may represent asymmetric alveolar pulmonary edema or superimposed acute pneumonic infiltrate. There is interlobular septal thickening in keeping with at least mild interstitial pulmonary edema, likely cardiogenic in nature. Small right and trace left pleural effusions are present. No pneumothorax. Upper Abdomen: Cholelithiasis without pericholecystic inflammatory change. No acute abnormality. Musculoskeletal: Right chest wall nerve stimulator device is seen with its single lead extending toward the right carotid body. No acute bone abnormality. No lytic or blastic bone lesion. Review of the MIP images confirms the above findings. IMPRESSION: 1. Status post aortic valve replacement and tube graft repair of the ascending aorta. No evidence of aortic intramural hematoma, dissection, or aneurysm. 2. Ventriassist left ventricular assist device in place.  Suspected thrombosis of the inflow and proximal outflow cannulas of the device. Opacification of the terminal outflow cannula may be retrograde in fashion from the ascending aorta. 3. Extensive multi-vessel coronary artery calcification. Marked thinning of the a  left ventricular apex and septal wall in keeping with prior myocardial infarction. 4. Mild emphysema. 5. Multifocal airspace infiltrates within the dependent right upper lobe and lower lobes bilaterally which may represent asymmetric alveolar pulmonary edema or superimposed acute pneumonic infiltrate. 6. Mild interstitial pulmonary edema, likely cardiogenic in nature. Small right and trace left pleural effusions. 7. Cholelithiasis. 8. Swan-Ganz catheter tip within the right lower lobar pulmonary artery origin. Withdrawal of the catheter by 3-4 cm may more optimally position the device. Aortic Atherosclerosis (ICD10-I70.0) and Emphysema (ICD10-J43.9). Electronically Signed: By: Fidela Salisbury M.D. On: 05/06/2022 22:55   CARDIAC CATHETERIZATION  Result Date: 05/06/2022   Prox Cx lesion is 50% stenosed.   Prox RCA to Mid RCA lesion is 100% stenosed.   Ost RCA to Prox RCA lesion is 70% stenosed.   2nd Diag lesion is 90% stenosed.   Dist LAD lesion is 50% stenosed.   Non-stenotic Mid LAD lesion was previously treated. Findings: Ao = 93/83 (88) RA = 1 RV = 34/5 PA = 28/18 (22) PCW = 5 Fick cardiac output/index = 4.8/2.2 TD CO/CI = 6.0/2.8 PVR = 3.0 WU Ao sat = 92% PA sat = 60% Assessment: 1. Stable CAD with evidence of pre-existing aortic root dissection around AVR 2. Low filling pressures with normal cardiac output Plan/Discussion: Stat CTA chest to evaluate dissection. Discussed with Dr. Tenny [name] in [place]. Brandon Bickers, MD 9:51 PM    DG Chest Portable 1 View  Result Date: 05/06/2022 CLINICAL DATA:  Chest pain, left ventricular assist device EXAM: PORTABLE CHEST 1 VIEW COMPARISON:  05/30/2021, 04/30/2022 FINDINGS: Two frontal views of the chest are  obtained. Postsurgical changes from median sternotomy and aortic valve replacement. Left ventricular assist device again noted. Portions of the left lung base are obscured by overlying cardiac leads and monitoring device. Nerve stimulator overlies the right chest, lead extending to the right submandibular region. Cardiac silhouette is enlarged. There is increased central vascular congestion with developing bibasilar interstitial and ground-glass opacities most consistent with edema, though viral pneumonia could give a similar appearance. No effusion or pneumothorax. IMPRESSION: 1. Bibasilar interstitial prominence and ground-glass opacities most consistent with developing pulmonary edema, though viral pneumonia could give a similar pattern of findings. 2. Left ventricular assist device. Electronically Signed   By: Randa Ngo M.D.   On: 05/06/2022 19:28   CT CHEST LUNG CA SCREEN LOW DOSE W/O CM  Result Date: 05/04/2022 CLINICAL DATA:  Former smoker with 45 pack-year history EXAM: CT CHEST WITHOUT CONTRAST LOW-DOSE FOR LUNG CANCER SCREENING TECHNIQUE: Multidetector CT imaging of the chest was performed following the standard protocol without IV contrast. RADIATION DOSE REDUCTION: This exam was performed according to the departmental dose-optimization program which includes automated exposure control, adjustment of the mA and/or kV according to patient size and/or use of iterative reconstruction technique. COMPARISON:  Lung cancer screening CT dated July 31 2020; chest CT dated March 11, 2021 FINDINGS: Cardiovascular: Cardiomegaly. No pericardial effusion. Prior aortic valve replacement severe coronary artery calcifications. LVAD device in place. Normal caliber thoracic aorta with moderate calcified plaque. Mediastinum/Nodes: Esophagus and thyroid are unremarkable. No pathologically enlarged lymph nodes seen in the chest. Lungs/Pleura: Central airways are patent. Moderate centrilobular emphysema. No  consolidation, pleural effusion or pneumothorax. New solid pulmonary nodule of the left upper lobe measuring 9 mm on image 64. Upper Abdomen: No acute abnormality. Musculoskeletal: No chest wall mass or suspicious bone lesions identified. IMPRESSION: 1. New solid pulmonary nodule of the left upper lobe measuring 9 mm.Lung-RADS  4B, suspicious. Additional imaging evaluation or consultation with Pulmonology or Thoracic Surgery recommended. 2. Coronary artery calcifications, aortic Atherosclerosis (ICD10-I70.0) and Emphysema (ICD10-J43.9). Electronically Signed   By: Yetta Glassman M.D.   On: 05/04/2022 12:32   ECHOCARDIOGRAM COMPLETE  Result Date: 04/23/2022    ECHOCARDIOGRAM REPORT   Patient Name:   Brandon [name] Date of Exam: 04/23/2022 Medical Rec #:  [id number]      Height:       72.0 in Accession #:    [id number]     Weight:       205.8 lb Date of Birth:  1955/06/20       BSA:          2.157 m Patient Age:    46 years       BP:           110/73 mmHg Patient Gender: M              HR:           100 bpm. Exam Location:  Inpatient Procedure: 2D Echo, Color Doppler and Cardiac Doppler Indications:    Ventricular Tachycardia I47.2  History:        Patient has prior history of Echocardiogram examinations, most                 recent 04/27/2021. CHF, CAD and Previous Myocardial Infarction,                 Defibrillator; Aortic Valve Disease. 04/06/21 Heartmate III                 LVAD.                 Aortic Valve: 25 mm INSPIRIS RESILIA valve is present in the                 aortic position. Procedure Date: 04/06/2021.  Sonographer:    Darlina Sicilian RDCS Referring Phys: [id number] DANIEL R BENSIMHON  Sonographer Comments: Pump flow 5.1. Pump Speed 5800. PI 3.5. Power 4.8 IMPRESSIONS  1. Study for LVAD     Patient has Heart Mate III.     Initial speek 5800,Flow 5.1 PI 3.5 Power 3.9     Patient is s/p a 25 mm Inspiris Resillia with evidence of aortic valve opening but with poor evaluation of prosthetic parameters.      The intraventricular septum is in the midline.     No mitral regurgitation. No tricuspid regurgitation.     Suboptimal cannular evaluation.. Left ventricular ejection fraction, by estimation, is 20 to 25%. The left ventricle has severely decreased function. The left ventricle demonstrates global hypokinesis. The left ventricular internal cavity size was mildly dilated. Left ventricular diastolic parameters are indeterminate.  2. Right ventricular systolic function was not well visualized. The right ventricular size is not well visualized.  3. Left atrial size was mildly dilated.  4. Right atrial size was mildly dilated.  5. The mitral valve is normal in structure. No evidence of mitral valve regurgitation. No evidence of mitral stenosis.  6. The aortic valve has been repaired/replaced. Aortic valve regurgitation is not visualized. No aortic stenosis is present. There is a 25 mm INSPIRIS RESILIA valve present in the aortic position. Procedure Date: 04/06/2021. Comparison(s): Prior images reviewed side by side. No significant effusion. This was not a RAMP study. Similar to prior. FINDINGS  Left Ventricle: Study for LVAD Patient has Heart Mate III. Initial speek 5800,Flow 5.1 PI 3.5 Power  3.9 Patient is s/p a 25 mm Inspiris Resillia with evidence of aortic valve opening but with poor evaluation of prosthetic parameters. The intraventricular septum is in the midline. No mitral regurgitation. No tricuspid regurgitation. Suboptimal cannular evaluation. Left ventricular ejection fraction, by estimation, is 20 to 25%. The left ventricle has severely decreased function. The left ventricle demonstrates global hypokinesis. The left ventricular internal cavity size was mildly dilated. There is no left ventricular hypertrophy. Left ventricular diastolic parameters are indeterminate. Right Ventricle: The right ventricular size is not well visualized. Right vetricular wall thickness was not well visualized. Right ventricular  systolic function was not well visualized. Left Atrium: Left atrial size was mildly dilated. Right Atrium: Right atrial size was mildly dilated. Pericardium: There is no evidence of pericardial effusion. Presence of epicardial fat layer. Mitral Valve: The mitral valve is normal in structure. No evidence of mitral valve regurgitation. No evidence of mitral valve stenosis. Tricuspid Valve: The tricuspid valve is normal in structure. Tricuspid valve regurgitation is not demonstrated. No evidence of tricuspid stenosis. Aortic Valve: The aortic valve has been repaired/replaced. Aortic valve regurgitation is not visualized. No aortic stenosis is present. There is a 25 mm INSPIRIS RESILIA valve present in the aortic position. Procedure Date: 04/06/2021. Pulmonic Valve: The pulmonic valve was not well visualized. Pulmonic valve regurgitation is not visualized. No evidence of pulmonic stenosis. Aorta: The aortic root and ascending aorta are structurally normal, with no evidence of dilitation. IAS/Shunts: No atrial level shunt detected by color flow Doppler.  LEFT VENTRICLE PLAX 2D LVIDd:         5.90 cm   Diastology LVIDs:         5.30 cm   LV e' medial:    6.68 cm/s LV PW:         0.90 cm   LV E/e' medial:  16.5 LV IVS:        1.20 cm   LV e' lateral:   9.90 cm/s LVOT diam:     2.20 cm   LV E/e' lateral: 11.1 LVOT Area:     3.80 cm  RIGHT VENTRICLE TAPSE (M-mode): 1.3 cm LEFT ATRIUM             Index LA Vol (A2C):   74.1 ml 34.36 ml/m LA Vol (A4C):   67.1 ml 31.11 ml/m LA Biplane Vol: 72.1 ml 33.43 ml/m  MITRAL VALVE MV Area (PHT): 5.79 cm     SHUNTS MV Decel Time: 131 msec     Systemic Diam: 2.20 cm MV E velocity: 110.00 cm/s MV A velocity: 100.60 cm/s MV E/A ratio:  1.09 Rudean Haskell MD Electronically signed by Rudean Haskell MD Signature Date/Time: 04/23/2022/4:50:44 PM    Final    Disposition   Pt is being discharged home today in good condition.  Follow-up Plans & Appointments     Follow-up  Information     [facility] Follow up.   Specialty: Cardiology Why: Advanced Heart Failure team will call you to arrange follow-up Contact information: [address] [id number] [place] [phone]               Discharge Instructions     AMB referral to Phase II Cardiac Rehabilitation   Complete by: As directed    Diagnosis: STEMI   After initial evaluation and assessments completed: Virtual Based Care may be provided alone or in conjunction with Phase 2 Cardiac Rehab based on patient barriers.: Yes  Intensive Cardiac Rehabilitation (ICR) [facility] location only OR Traditional Cardiac Rehabilitation (TCR) *If criteria for ICR are not met will enroll in TCR [facility] only): Yes   Diet - low sodium heart healthy   Complete by: As directed    Discharge instructions   Complete by: As directed    You were started on aspirin and metoprolol, new prescription sent in. If for some reason you have any issue filling these at your usual pharmacy with the holiday, call the clinic back to speak with the on-call provider. The pharmacy phone line did say they are open until 6pm today.  Your warfarin (Coumadin) dose was changed to 7.47m (3 tablets) daily for now. The heart failure team will contact you to arrange your repeat INR.  Dr. MAundra Dubinwould like you to only use torsemide as-needed for fluid retention, not scheduled every day - your prescription is already written for as-needed only so we did not make any change to the actual prescription.   Increase activity slowly   Complete by: As directed    Dr. MAundra Dubinfeels you can resume driving (as long as you are feeling well). No lifting over 10 lbs for 4 weeks. No sexual activity for 4 weeks. Keep procedure site clean & dry. If you notice increased pain, swelling, bleeding or pus, call/return!  You may shower, but no soaking baths/hot tubs/pools for 1 week.        Discharge  Medications   Allergies as of 05/12/2022   No Known Allergies      Medication List     TAKE these medications    acetaminophen 500 MG tablet Commonly known as: TYLENOL Take 500-1,000 mg by mouth every 6 (six) hours as needed (for pain).   ARTIFICIAL TEARS OP Place 1 drop into both eyes daily as needed (for dryness or irritation).   aspirin EC 81 MG tablet Take 1 tablet (81 mg total) by mouth daily. Swallow whole.   atorvastatin 80 MG tablet Commonly known as: LIPITOR TAKE 1 TABLET BY MOUTH ONCE DAILY AT 6PM.   eplerenone 50 MG tablet Commonly known as: INSPRA Take 1 tablet (50 mg total) by mouth in the morning.   ezetimibe 10 MG tablet Commonly known as: ZETIA Take 1 tablet (10 mg total) by mouth daily.   gabapentin 300 MG capsule Commonly known as: NEURONTIN Take 1 capsule (300 mg total) by mouth 3 (three) times daily.   losartan 25 MG tablet Commonly known as: COZAAR Take 0.5 tablets (12.5 mg total) by mouth daily.   melatonin 5 MG Tabs Take 1 tablet (5 mg total) by mouth at bedtime.   metoprolol succinate 25 MG 24 hr tablet Commonly known as: TOPROL-XL Take 0.5 tablets (12.5 mg total) by mouth daily.   nystatin ointment Commonly known as: MYCOSTATIN Apply 1 application topically 2 (two) times daily.   ondansetron 4 MG tablet Commonly known as: Zofran Take 1 tablet (4 mg total) by mouth every 8 (eight) hours as needed for nausea or vomiting.   pantoprazole 40 MG tablet Commonly known as: PROTONIX Take 1 tablet (40 mg total) by mouth 2 (two) times daily.   potassium chloride SA 20 MEQ tablet Commonly known as: KLOR-CON M Take 1 tablet (20 mEq total) by mouth daily.   sertraline 100 MG tablet Commonly known as: ZOLOFT Take 1 tablet (100 mg total) by mouth daily.   tamsulosin 0.4 MG Caps capsule Commonly known as: FLOMAX Take 1 capsule (0.4 mg total) by mouth daily.  torsemide 20 MG tablet Commonly known as: DEMADEX Take 1 tablet (20 mg  total) by mouth as needed.   traMADol 50 MG tablet Commonly known as: ULTRAM TAKE 1-2 TABLETS BY MOUTH TWICE A DAY FOR 30 DAYS What changed:  how much to take how to take this when to take this reasons to take this additional instructions  Add Med Details: You may continue to take this medicine as-needed instead of scheduled. We did not adjust the prescription so that we did not disrupt the original prescription trail from your primary care.   warfarin 2.5 MG tablet Commonly known as: Coumadin Take as directed. If you are unsure how to take this medication, talk to your nurse or doctor. Original instructions: Take 7.5 mg (3 tablets) by mouth daily or as instructed by LVAD clinic. What changed: additional instructions   ZyrTEC Allergy 10 MG tablet Generic drug: cetirizine Take 10 mg by mouth daily.           Outstanding Labs/Studies   Pending f/u with CHF service  Duration of Discharge Encounter   Greater than 30 minutes including physician time.  Signed, Charlie Pitter, PA-C 05/12/2022, 12:15 PM

## 2022-05-14 ENCOUNTER — Other Ambulatory Visit (HOSPITAL_COMMUNITY): Payer: Self-pay

## 2022-05-14 ENCOUNTER — Other Ambulatory Visit: Payer: Self-pay

## 2022-05-14 ENCOUNTER — Telehealth: Payer: Self-pay

## 2022-05-14 DIAGNOSIS — I2102 ST elevation (STEMI) myocardial infarction involving left anterior descending coronary artery: Secondary | ICD-10-CM

## 2022-05-14 DIAGNOSIS — I5042 Chronic combined systolic (congestive) and diastolic (congestive) heart failure: Secondary | ICD-10-CM

## 2022-05-14 MED ORDER — GABAPENTIN 300 MG PO CAPS
300.0000 mg | ORAL_CAPSULE | Freq: Three times a day (TID) | ORAL | 3 refills | Status: DC
  Filled 2022-05-14: qty 90, 30d supply, fill #0
  Filled 2022-06-12: qty 90, 30d supply, fill #1
  Filled 2022-07-11: qty 90, 30d supply, fill #2
  Filled 2022-08-06: qty 90, 30d supply, fill #3

## 2022-05-14 NOTE — Patient Outreach (Signed)
  Care Coordination TOC Note Transition Care Management Follow-up Telephone Call Date of discharge and from where: 05/12/22-[place] [facility]  Dx: "precordial chest pain" How have you been since you were released from the hospital? Patient voices that so far he is "doing good." He has been resting as he is till a little fatigued. Appetite has returned to 75-80% back to normal. He denies any chest pain or cardiac issues.  Any questions or concerns? Yes-Patient inquiring about what Phase II cardiac rehab means and should he call to make an appt. Discussed with pt cardiac rehab program. Advised that he should receive a call in the next few days but if not he has contact info to reach out to them to make an appt.   Items Reviewed: Did the pt receive and understand the discharge instructions provided? Yes  Medications obtained and verified? Yes  Other? No  Any new allergies since your discharge? No  Dietary orders reviewed? Yes Do you have support at home? Yes   Home Care and Equipment/Supplies: Were home health services ordered? not applicable If so, what is the name of the agency? N/A  Has the agency set up a time to come to the patient's home? not applicable Were any new equipment or medical supplies ordered?  No What is the name of the medical supply agency? N/A Were you able to get the supplies/equipment? not applicable Do you have any questions related to the use of the equipment or supplies? No  Functional Questionnaire: (I = Independent and D = Dependent) ADLs: I  Bathing/Dressing- I  Meal Prep- I  Eating- I  Maintaining continence- I  Transferring/Ambulation- I  Managing Meds- I  Follow up appointments reviewed:  PCP Hospital f/u appt confirmed?  Patient wanted to call MD office on his own to make an appt and discuss recent scan results.  [name] f/u appt confirmed? Yes  Scheduled to see [facility] on 06/03/22 @ 10am. Are transportation arrangements needed? No   If their condition worsens, is the pt aware to call PCP or go to the Emergency Dept.? Yes Was the patient provided with contact information for the PCP's office or ED? Yes Was to pt encouraged to call back with questions or concerns? Yes  SDOH assessments and interventions completed:   Yes SDOH Interventions Today    Flowsheet Row Most Recent Value  SDOH Interventions   Food Insecurity Interventions Intervention Not Indicated  Transportation Interventions Intervention Not Indicated       Care Coordination Interventions:  Eduction provided    Encounter Outcome:  Pt. Visit Completed    Enzo [place], RN,BSN,CCM [place] Management Telephonic Care Management Coordinator Direct Phone: [phone] Toll Free: [phone] Fax: [phone]

## 2022-05-15 ENCOUNTER — Other Ambulatory Visit: Payer: Self-pay | Admitting: Family Medicine

## 2022-05-15 DIAGNOSIS — R911 Solitary pulmonary nodule: Secondary | ICD-10-CM

## 2022-05-15 DIAGNOSIS — I700 Atherosclerosis of aorta: Secondary | ICD-10-CM | POA: Insufficient documentation

## 2022-05-16 ENCOUNTER — Ambulatory Visit (HOSPITAL_COMMUNITY): Payer: Self-pay | Admitting: Pharmacist

## 2022-05-16 LAB — POCT INR: INR: 2.8 (ref 2.0–3.0)

## 2022-05-23 ENCOUNTER — Ambulatory Visit (HOSPITAL_COMMUNITY): Payer: Self-pay | Admitting: Pharmacist

## 2022-05-23 ENCOUNTER — Other Ambulatory Visit (HOSPITAL_COMMUNITY): Payer: Self-pay

## 2022-05-23 LAB — POCT INR: INR: 4 — AB (ref 2.0–3.0)

## 2022-05-28 ENCOUNTER — Other Ambulatory Visit: Payer: Self-pay | Admitting: Thoracic Surgery (Cardiothoracic Vascular Surgery)

## 2022-05-28 ENCOUNTER — Encounter: Payer: Self-pay | Admitting: Thoracic Surgery (Cardiothoracic Vascular Surgery)

## 2022-05-28 ENCOUNTER — Institutional Professional Consult (permissible substitution) (INDEPENDENT_AMBULATORY_CARE_PROVIDER_SITE_OTHER): Payer: Medicare Other | Admitting: Thoracic Surgery (Cardiothoracic Vascular Surgery)

## 2022-05-28 VITALS — BP 109/81 | HR 75 | Resp 20 | Ht 72.0 in | Wt 209.0 lb

## 2022-05-28 DIAGNOSIS — R911 Solitary pulmonary nodule: Secondary | ICD-10-CM | POA: Diagnosis not present

## 2022-05-28 NOTE — Progress Notes (Signed)
PCP is [name], MD Referring Provider is [name], MD  Chief Complaint  Patient presents with   Lung Lesion    Chest Ct 12/19, Chest CTA 12/28, PFTs 2022    HPI: Mr. Cotham is sent for consultation regarding a left.[name]  Filemon Breton is a 67 year old man with a complicated medical history including CAD, MI, heart failure, left ventricular assist device placement, ICD placement, hypertension, hyperlipidemia, ankylosing spondylitis, and tobacco abuse.  He smoked about a pack a day for 40 years prior to quitting about 2 1/2 years ago.  He had a low-dose screening CT in December which showed a new left upper lobe lung nodule.  He had 2 additional CTs later in the month which also showed the nodule without significant change.  Complex cardiac history.  Had anterior STEMI complicated by cardiogenic shock in 2020.  EF at that time 30 to 35% with apical aneurysm.  Continued to have issues with heart failure.  Admitted with heart failure and found to have EF of 20% with possible vegetation in 2022.  He had a left ventricular assist device placed along with a tissue aortic valve replacement in November 2022.  Was treated with antibiotics for 6 weeks afterwards.    He had a complicated month of December.  On 04/22/2022 he presented with shortness of breath and diaphoresis.  Found to be in ventricular fibrillation.  Was defibrillated.  Then on Christmas day he presented back with chest pain, nausea, and shortness of breath.  ECG showed ST elevation.  Catheterization showed stable CAD.  He has not had any further issues since that time.  Zubrod Score: At the time of surgery this patient's most appropriate activity status/level should be described as: []     0    Normal activity, no symptoms [x]     1    Restricted in physical strenuous activity but ambulatory, able to do out light work []     2    Ambulatory and capable of self care, unable to do work activities, up and about >50 % of waking  hours                              []     3    Only limited self care, in bed greater than 50% of waking hours []     4    Completely disabled, no self care, confined to bed or chair []     5    Moribund  Past Medical History:  Diagnosis Date   AICD (automatic cardioverter/defibrillator) present 08/31/2019   Ankylosing spondylitis ([place])    Arthritis    BENIGN PROSTATIC HYPERTROPHY 06/07/2008   CHF (congestive heart failure) ([place])    COLONIC POLYPS, HX OF 06/07/2008   Coronary artery disease    Depression    H/O hiatal hernia    Heart failure ([place])    HYPERLIPIDEMIA 06/07/2008   HYPERTENSION 06/07/2008   Myocardial infarction [facility]) 2005   NSTEMI, s/p LAD stent   NEPHROLITHIASIS, HX OF 06/07/2008   STEMI (ST elevation myocardial infarction) ([place]) 04/27/2019    Past Surgical History:  Procedure Laterality Date   AORTIC VALVE REPLACEMENT N/A 04/06/2021   Procedure: AORTIC VALVE REPLACEMENT (AVR) WITH INSPIRIS RESILIA  AORTIC VALVE SIZE 25MM;  Surgeon: Gaye Pollack, MD;  Location: [facility];  Service: Open Heart Surgery;  Laterality: N/A;   COLONOSCOPY WITH PROPOFOL N/A 04/24/2020   Procedure: COLONOSCOPY WITH PROPOFOL;  Surgeon: Benancio Deeds, MD;  Location: [facility];  Service: Gastroenterology;  Laterality: N/A;   CORONARY ANGIOPLASTY WITH STENT PLACEMENT  2005   LAD stent, jailed diagonal   CORONARY/GRAFT ACUTE MI REVASCULARIZATION N/A 04/27/2019   Procedure: Coronary/Graft Acute MI Revascularization;  Surgeon: Corky Crafts, MD;  Location: [facility] INVASIVE CV LAB;  Service: Cardiovascular;  Laterality: N/A;   ICD IMPLANT  08/31/2019   ICD IMPLANT N/A 08/31/2019   Procedure: ICD IMPLANT;  Surgeon: Hillis Range, MD;  Location: [facility] INVASIVE CV LAB;  Service: Cardiovascular;  Laterality: N/A;   ICD LEAD REMOVAL N/A 12/04/2020   Procedure: ICD SYSTEM EXTRACTION;  Surgeon: Lanier Prude, MD;  Location: [facility] OR;  Service: Cardiovascular;  Laterality: N/A;   INSERTION OF  IMPLANTABLE LEFT VENTRICULAR ASSIST DEVICE N/A 04/06/2021   Procedure: INSERTION OF IMPLANTABLE LEFT VENTRICULAR ASSIST DEVICE;  Surgeon: Alleen Borne, MD;  Location: [facility] OR;  Service: Open Heart Surgery;  Laterality: N/A;  HM3   IR THORACENTESIS ASP PLEURAL SPACE W/IMG GUIDE  04/19/2021   IR THORACENTESIS ASP PLEURAL SPACE W/IMG GUIDE  04/20/2021   IR THORACENTESIS ASP PLEURAL SPACE W/IMG GUIDE  04/23/2021   LAMINECTOMY     lumbar   LEFT HEART CATH AND CORONARY ANGIOGRAPHY N/A 04/27/2019   Procedure: LEFT HEART CATH AND CORONARY ANGIOGRAPHY;  Surgeon: Corky Crafts, MD;  Location: [facility] INVASIVE CV LAB;  Service: Cardiovascular;  Laterality: N/A;   LUMBAR FUSION  01/2012   POLYPECTOMY  04/24/2020   Procedure: POLYPECTOMY;  Surgeon: Benancio Deeds, MD;  Location: [facility]COPY;  Service: Gastroenterology;;   RIGHT HEART CATH N/A 04/27/2019   Procedure: RIGHT HEART CATH;  Surgeon: Swazil[place], Kelby M, MD;  Location: [facility] INVASIVE CV LAB;  Service: Cardiovascular;  Laterality: N/A;   RIGHT HEART CATH N/A 03/28/2021   Procedure: RIGHT HEART CATH;  Surgeon: Dolores Patty, MD;  Location: [facility] INVASIVE CV LAB;  Service: Cardiovascular;  Laterality: N/A;   RIGHT HEART CATH AND CORONARY ANGIOGRAPHY N/A 05/06/2022   Procedure: RIGHT HEART CATH AND CORONARY ANGIOGRAPHY;  Surgeon: Dolores Patty, MD;  Location: [facility] INVASIVE CV LAB;  Service: Cardiovascular;  Laterality: N/A;   RIGHT/LEFT HEART CATH AND CORONARY ANGIOGRAPHY N/A 11/29/2020   Procedure: RIGHT/LEFT HEART CATH AND CORONARY ANGIOGRAPHY;  Surgeon: Dolores Patty, MD;  Location: [facility] INVASIVE CV LAB;  Service: Cardiovascular;  Laterality: N/A;   TEE WITHOUT CARDIOVERSION N/A 12/04/2020   Procedure: TRANSESOPHAGEAL ECHOCARDIOGRAM (TEE);  Surgeon: Lanier Prude, MD;  Location: [facility] OR;  Service: Cardiovascular;  Laterality: N/A;   TEE WITHOUT CARDIOVERSION N/A 04/06/2021   Procedure: TRANSESOPHAGEAL ECHOCARDIOGRAM (TEE);  Surgeon:  Alleen Borne, MD;  Location: [facility] OR;  Service: Open Heart Surgery;  Laterality: N/A;   TONSILLECTOMY     warthin tumor removal Left 2014    Family History  Problem Relation Age of Onset   Depression Father    Arthritis Sister        RA   Heart failure Mother    Other Neg Hx    Colon cancer Neg Hx    Esophageal cancer Neg Hx    Rectal cancer Neg Hx    Stomach cancer Neg Hx    Colon polyps Neg Hx     Social History Social History   Tobacco Use   Smoking status: Former    Packs/day: 1.00    Years: 40.00    Total pack years: 40.00    Types: Cigarettes    Quit  date: 07/26/2019    Years since quitting: 2.8   Smokeless tobacco: Never   Tobacco comments:    1-1.5 pks per year x 40-45 yrs  Vaping Use   Vaping Use: Some days   Substances: CBD  Substance Use Topics   Alcohol use: Yes    Alcohol/week: 7.0 standard drinks of alcohol    Types: 7 Shots of liquor per week    Comment: "2 vodka tonics a night"   Drug use: Yes    Comment: occasionally - CBD oil    Current Outpatient Medications  Medication Sig Dispense Refill   acetaminophen (TYLENOL) 500 MG tablet Take 500-1,000 mg by mouth every 6 (six) hours as needed (for pain).     aspirin EC 81 MG tablet Take 1 tablet (81 mg total) by mouth daily. Swallow whole. 90 tablet 1   atorvastatin (LIPITOR) 80 MG tablet TAKE 1 TABLET BY MOUTH ONCE DAILY AT 6PM. (Patient taking differently: Take 80 mg by mouth at bedtime.) 90 tablet 3   Carboxymethylcellulose Sodium (ARTIFICIAL TEARS OP) Place 1 drop into both eyes daily as needed (for dryness or irritation).     eplerenone (INSPRA) 50 MG tablet Take 1 tablet (50 mg total) by mouth in the morning. 30 tablet 6   ezetimibe (ZETIA) 10 MG tablet Take 1 tablet (10 mg total) by mouth daily. 90 tablet 3   gabapentin (NEURONTIN) 300 MG capsule Take 1 capsule (300 mg total) by mouth 3 (three) times daily. 90 capsule 3   losartan (COZAAR) 25 MG tablet Take 0.5 tablets (12.5 mg total) by mouth  daily. 30 tablet 5   melatonin 5 MG TABS Take 1 tablet (5 mg total) by mouth at bedtime. 30 tablet 6   metoprolol succinate (TOPROL-XL) 25 MG 24 hr tablet Take 0.5 tablets (12.5 mg total) by mouth daily. 45 tablet 1   nystatin ointment (MYCOSTATIN) Apply 1 application topically 2 (two) times daily. 30 g 3   ondansetron (ZOFRAN) 4 MG tablet Take 1 tablet (4 mg total) by mouth every 8 (eight) hours as needed for nausea or vomiting. 20 tablet 3   pantoprazole (PROTONIX) 40 MG tablet Take 1 tablet (40 mg total) by mouth 2 (two) times daily. 60 tablet 3   potassium chloride SA (KLOR-CON M) 20 MEQ tablet Take 1 tablet (20 mEq total) by mouth daily. 90 tablet 3   sertraline (ZOLOFT) 100 MG tablet Take 1 tablet (100 mg total) by mouth daily. 90 tablet 3   tamsulosin (FLOMAX) 0.4 MG CAPS capsule Take 1 capsule (0.4 mg total) by mouth daily. 90 capsule 3   torsemide (DEMADEX) 20 MG tablet Take 1 tablet (20 mg total) by mouth as needed. (Patient taking differently: Take 20 mg by mouth daily as needed (for fluid or edema).) 30 tablet 5   traMADol (ULTRAM) 50 MG tablet TAKE 1-2 TABLETS BY MOUTH TWICE A DAY FOR 30 DAYS (Patient taking differently: Take 50-100 mg by mouth 2 (two) times daily as needed (for pain).) 120 tablet 1   warfarin (COUMADIN) 2.5 MG tablet Take 7.5 mg (3 tablets) by mouth daily or as instructed by LVAD clinic. 270 tablet 1   ZYRTEC ALLERGY 10 MG tablet Take 10 mg by mouth daily.     No current facility-administered medications for this visit.    No Known Allergies  Review of Systems  Constitutional:  Negative for activity change, fatigue and unexpected weight change.  HENT:  Negative for trouble swallowing and voice change.  Respiratory:  Positive for shortness of breath.   Cardiovascular:  Positive for chest pain. Negative for leg swelling.  Gastrointestinal:  Negative for abdominal distention and abdominal pain.  Genitourinary:  Negative for difficulty urinating.   Musculoskeletal:  Negative for arthralgias.  Neurological:  Negative for seizures, syncope and weakness.  Hematological:  Negative for adenopathy. Bruises/bleeds easily.  All other systems reviewed and are negative.   Ht 6' (1.829 m)   BMI 26.64 kg/m  Physical Exam Vitals reviewed.  Constitutional:      General: He is not in acute distress.    Appearance: Normal appearance.  HENT:     Head: Normocephalic and atraumatic.  Eyes:     General: No scleral icterus.    Extraocular Movements: Extraocular movements intact.  Cardiovascular:     Comments: Hum from LVAD Pulmonary:     Effort: No respiratory distress.     Breath sounds: Normal breath sounds. No wheezing or rales.  Abdominal:     Palpations: Abdomen is soft.     Comments: Device in place  Musculoskeletal:        General: No swelling.     Cervical back: Neck supple.  Skin:    General: Skin is warm and dry.  Neurological:     General: No focal deficit present.     Mental Status: He is alert and oriented to person, place, and time.     Cranial Nerves: No cranial nerve deficit.     Motor: No weakness.    Diagnostic Tests: CT CHEST WITHOUT CONTRAST LOW-DOSE FOR LUNG CANCER SCREENING   TECHNIQUE: Multidetector CT imaging of the chest was performed following the standard protocol without IV contrast.   RADIATION DOSE REDUCTION: This exam was performed according to the departmental dose-optimization program which includes automated exposure control, adjustment of the mA and/or kV according to patient size and/or use of iterative reconstruction technique.   COMPARISON:  Lung cancer screening CT dated July 31 2020; chest CT dated March 11, 2021   FINDINGS: Cardiovascular: Cardiomegaly. No pericardial effusion. Prior aortic valve replacement severe coronary artery calcifications. LVAD device in place. Normal caliber thoracic aorta with moderate calcified plaque.   Mediastinum/Nodes: Esophagus and thyroid are  unremarkable. No pathologically enlarged lymph nodes seen in the chest.   Lungs/Pleura: Central airways are patent. Moderate centrilobular emphysema. No consolidation, pleural effusion or pneumothorax. New solid pulmonary nodule of the left upper lobe measuring 9 mm on image 64.   Upper Abdomen: No acute abnormality.   Musculoskeletal: No chest wall mass or suspicious bone lesions identified.   IMPRESSION: 1. New solid pulmonary nodule of the left upper lobe measuring 9 mm.Lung-RADS 4B, suspicious. Additional imaging evaluation or consultation with Pulmonology or Thoracic Surgery recommended. 2. Coronary artery calcifications, aortic Atherosclerosis (ICD10-I70.0) and Emphysema (ICD10-J43.9).     Electronically Signed   By: Yetta Glassman M.D.   On: 05/04/2022 12:32 I personally reviewed the CT images from this study and multiple other studies.  New left upper lobe nodule measuring about 7 mm on most recent CT.  No hilar or mediastinal adenopathy.  Cardiac findings as noted above.  Impression: Nathanyel Defenbaugh is a 67 year old man with a complicated medical history including CAD, MI, heart failure, left ventricular assist device placement, ICD placement, hypertension, hyperlipidemia, ankylosing spondylitis, and tobacco abuse.  He smoked about a pack a day for 40 years prior to quitting about 2 1/2 years ago.  He was recently found to have a left upper lobe lung nodule on  a low-dose screening CT.  Subsequent CT angio also showed the 7 mm left upper lobe nodule.  Left upper lobe nodule-concerning for possible primary bronchogenic carcinoma.  Infectious and inflammatory nodules are also in the differential.  Less than a centimeter but is solid.  I recommended that we do a follow-up scan in 6 weeks.  That will be about 3 months from his original scan.  Will do a PET/CT to see if there is any associated metabolic activity.  Will use super D protocol for the CT portion to allow for planning for  possible navigational bronchoscopy.  Given his complex medical history, stereotactic radiation is likely the best option for treatment if the nodule is cancerous.  Will await PET results.  If highly suspicious could consider stereotactic radiation without a biopsy, as any biopsy will be complicated given his assist device.  If the PET is not remarkable we may be able to follow-up the nodule.  LVAD-doing well symptomatically currently after complicated month December.  Tobacco abuse-quit smoking 2 and half years ago.  Plan: Return in 6 weeks with PET/CT   I spent 55 minutes in review of records, images, and in consultation with Mr. Switalski today. Loreli Slot, MD [facility] [phone]

## 2022-05-30 ENCOUNTER — Ambulatory Visit (HOSPITAL_COMMUNITY): Payer: Self-pay | Admitting: Pharmacist

## 2022-05-30 LAB — POCT INR: INR: 3.2 — AB (ref 2.0–3.0)

## 2022-06-02 NOTE — Progress Notes (Signed)
LVAD Follow-up Clinic Note  PCP: Deeann Saint, MD HF MD: DB   HPI:  Brandon [name] is a 67 year old male with history of tobacco use, CAD s/p previous anterior MI, severe systolic HF s/p HM-3 VAD 04/06/21, severe AS s/p AVR, endocarditis.    Admitted 12/20 with anterior ST elevation MI. Cath with chronically occluded RCA (L>>R collaterals) and thrombotic occlusion of proximal LAD treated with PCI. Echo w/ reduced LVEF 30-35% w/ apical aneurysm. RV ok. Post cath, he required milrinone for cardiogenic shock. Subsequently underwent Barostim.   Underwent outpatient [facility] 11/29/20 as part of VAD w/u. Post procedure, developed severe rigors and fever. Admitted to ICU Blood Cx + strep. 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  Likely low-flow low gradient AS. Underwent ICD extraction..    Admitted for cardiogenic shock in 10/22.  He underwent placement of HM3 VAD and aortic valve replacement with 25 mm Edwards Inspiris Resilia valve on 04/06/21. Surgical path came back 11/28 with evidence of possible "acute aortic valve endocarditis". 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 Discharged 05/09/21.   He had a complicated month of December.  On 04/22/2022 he presented with shortness of breath and diaphoresis.  Found to be in ventricular fibrillation.  Was defibrillated.  Then on Christmas day he presented back with chest pain, nausea, and shortness of breath.  ECG showed ST elevation.  Catheterization showed stable CAD. Cardiac CT suspicious for clot on AoV. INR increased to 2.5-3.0 range.    Follow up for Heart Failure/LVAD: Here for f/u. Feeling much better. Starting to gain more confidence with exercising. Mild DOE. No CP. Denies orthopnea or PND. No fevers, chills or  problems with driveline. No bleeding, melena or neuro symptoms. No VAD alarms. Taking all meds as prescribed.   VAD Interrogation: Speed: 5800 Flow: 5.3 Power: 4.8w    PI: 3.3 Alarms: none Events: 50 - 70 PI events daily  Fixed speed: 5800 Low speed limit: 5500   Primary controller: back up battery due for replacement in 19 months Secondary controller:  back up battery due for replacement in 20 months    I reviewed the LVAD parameters from today and compared the results to the patient's prior recorded data. LVAD interrogation was NEGATIVE for significant power changes, NEGATIVE for clinical alarms and STABLE for PI events/speed drops. No programming changes were made and pump is functioning within specified parameters. Pt is performing daily controller and system monitor self tests along with completing weekly and monthly maintenance for LVAD equipment.   LVAD equipment check completed and is in good working order. Back-up equipment  present.    Annual Equipment Maintenance on UBC/PM was performed on 06/03/22.    Exit Site Care: Dressing CDI. No redness, tenderness, drainage or odor reported by patient. Will continue weekly dressings.  Provided patient with 8 weekly kits.     Past Medical History:  Diagnosis Date   AICD (automatic cardioverter/defibrillator) present 08/31/2019   Ankylosing spondylitis ([facility])    Arthritis    BENIGN PROSTATIC HYPERTROPHY 06/07/2008   CHF (congestive heart failure) ([facility])    COLONIC POLYPS, HX OF 06/07/2008   Coronary artery disease    Depression    H/O hiatal hernia    Heart failure ([facility])    HYPERLIPIDEMIA 06/07/2008   HYPERTENSION 06/07/2008  Myocardial infarction [facility]) 2005   NSTEMI, s/p LAD stent   NEPHROLITHIASIS, HX OF 06/07/2008   STEMI (ST elevation myocardial infarction) ([facility]) 04/27/2019    Current Outpatient Medications  Medication Sig Dispense Refill   acetaminophen (TYLENOL) 500 MG tablet Take 1,000 mg by mouth every 6 (six)  hours as needed (pain.).     atorvastatin (LIPITOR) 80 MG tablet TAKE 1 TABLET BY MOUTH ONCE DAILY AT 6PM. 90 tablet 3   Carboxymethylcellulose Sodium (ARTIFICIAL TEARS OP) Place 1 drop into both eyes daily as needed (for dryness or irritation).     eplerenone (INSPRA) 50 MG tablet Take 1 tablet (50 mg total) by mouth in the morning. 30 tablet 6   ezetimibe (ZETIA) 10 MG tablet Take 1 tablet (10 mg total) by mouth daily. 90 tablet 3   gabapentin (NEURONTIN) 300 MG capsule Take 1 capsule (300 mg total) by mouth 3 (three) times daily. 90 capsule 3   losartan (COZAAR) 25 MG tablet Take 1/2 tablet (12.5 mg total) by mouth daily. 30 tablet 5   melatonin 5 MG TABS Take 1 tablet (5 mg total) by mouth at bedtime. (Patient taking differently: Take 10 mg by mouth once a week.) 30 tablet 6   ondansetron (ZOFRAN) 4 MG tablet Take 1 tablet (4 mg total) by mouth every 8 (eight) hours as needed for nausea or vomiting. 20 tablet 3   pantoprazole (PROTONIX) 40 MG tablet TAKE 1 TABLET BY MOUTH EVERY DAY 90 tablet 3   potassium chloride SA (KLOR-CON M) 20 MEQ tablet Take 1 tablet (20 mEq total) by mouth daily. 90 tablet 3   tamsulosin (FLOMAX) 0.4 MG CAPS capsule Take 1 capsule (0.4 mg total) by mouth daily. 90 capsule 3   traMADol (ULTRAM) 50 MG tablet TAKE 1-2 TABLETS BY MOUTH TWICE A DAY FOR 30 DAYS 120 tablet 0   warfarin (COUMADIN) 2.5 MG tablet Take 7.5 mg (3 tablets) every Monday/Friday and 5 mg (2 tablets) all other days or as instructed by LVAD clinic. 180 tablet 11   nystatin ointment (MYCOSTATIN) Apply 1 application topically 2 (two) times daily. (Patient not taking: Reported on 10/11/2021) 30 g 3   sertraline (ZOLOFT) 100 MG tablet Take 1 tablet (100 mg total) by mouth daily. 90 tablet 3   torsemide (DEMADEX) 20 MG tablet Take 1 tablet (20 mg total) by mouth as needed. 30 tablet 5   No current facility-administered medications for this encounter.    Patient has no known allergies.    Vitals:    02/20/22 1043 02/20/22 1044 02/20/22 1045  BP: (!) 112/100 (!) 100/0 115/72  Pulse: 93    Weight: 94.5 kg (208 lb 6.4 oz)       Vital Signs:     Doppler Pressure: 84 Automatc BP:  127/69 (95) HR: 80                          SPO2:  100% on RA   Weight: 210.8 w/ ept Last weight: 208.4 lb w/ eqt Ht: 6'    Physical Exam: General:  NAD.  HEENT: normal  Neck: supple. JVP not elevated.  Carotids 2+ bilat; no bruits. No lymphadenopathy or thryomegaly appreciated. Cor: LVAD hum.  Lungs: Clear. Abdomen: soft, nontender, non-distended. No hepatosplenomegaly. No bruits or masses. Good bowel sounds. Driveline site clean. Anchor in place.  Extremities: no cyanosis, clubbing, rash. Warm no edema  Neuro: alert & oriented x 3. No focal deficits. Moves all  4 without problem    ASSESSMENT AND PLAN:  1. Chronic systolic HFr EF due to iCM - Echo 05/19/20 EF 20-25% RV mildly HK. moderate AS  Mean gradient 13 AVA 1.2 cm2 DI 0.30 - s/p HM-III VAD + bioprosthetic AVR 04/06/21 - Episode of cardiogenic shock and anterior ST elevation 12/23. Cath with patent LAD stent. Cardiac CT suggestive of clot on aortic valve. Felt to be possible embolic event  - Stable NYHA II - Volume status ok  - Continue eplerenone 50 mg daily   2. HM-3 LVAD implant - VAD interrogated personally. Parameters stable. - ASA stopped 12/26 - INR 2.1 Goal 2.5-3.0 due to clotting on AoV and possible coronary embolism 12/23 Discussed dosing with PharmD personally. - LDH 156 - Hgb 13.9 - MAPs ok - DL site ok  3. VT/VF 12/23 - remains in NSR. No further VT/VF   4. Severe low-flow aortic stenosis s/p AVR - s/p VAD/bioprosthetic AVR   5. CAD  - s/p anterior STEMI (12/20). LHC showed chronically occluded RCA (with L>>R collaterals) and thrombotic occlusion of proximal LAD. Underwent PCI of LAD. - Recurrent anterior STEMI in 12/23 Cath 12/23 LAD stent patent. Felt to be embolic from aortic valve - Now s/p VAD - Continue  atorvastatin 80.  - off ASA.  - Refer cardiac rehab    6. Paroxysmal Atrial Fibrillation w/ RVR - Remains in NSR  - Off amio - on warfarin.    7. 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  - Tissue sent to [facility] in [place] for broad 16 S ribosomal sequencing for bacterial pathogens as well as T wet Bhilai Bartonella Brucella  - Resolved  8. Ankylosing spondylitis - His local rheumatologist is hesitant to start remicade with VAD/infection risk - D/w [name] VAD program they have recommended f/u with Council Mechanic, MD for 2nd opinion if needed.  - Currently feeling ok without it - PCP managing pain meds  9. LUL lung nodule - h/o tobacco use - concerning for possible primary bronchogenic carcinoma.  - following with Dr. Dorris Fetch  - PET pending.   Total time spent 35 minutes. Over half that time spent discussing above.    Arvilla Meres, MD  5:40 PM

## 2022-06-03 ENCOUNTER — Encounter (HOSPITAL_COMMUNITY): Payer: Self-pay | Admitting: Internal Medicine

## 2022-06-03 ENCOUNTER — Ambulatory Visit (HOSPITAL_COMMUNITY): Payer: Self-pay | Admitting: Pharmacist

## 2022-06-03 ENCOUNTER — Encounter (HOSPITAL_COMMUNITY): Payer: Medicare Other

## 2022-06-03 ENCOUNTER — Ambulatory Visit (HOSPITAL_COMMUNITY)
Admission: RE | Admit: 2022-06-03 | Discharge: 2022-06-03 | Disposition: A | Discharge status: Home / Self Care | Payer: Medicare Other | Source: Ambulatory Visit | Attending: Internal Medicine | Admitting: Internal Medicine

## 2022-06-03 ENCOUNTER — Other Ambulatory Visit (HOSPITAL_COMMUNITY): Payer: Self-pay | Admitting: *Deleted

## 2022-06-03 VITALS — BP 127/69 | HR 80 | Wt 210.8 lb

## 2022-06-03 DIAGNOSIS — Z9889 Other specified postprocedural states: Secondary | ICD-10-CM | POA: Diagnosis not present

## 2022-06-03 DIAGNOSIS — F32A Depression, unspecified: Secondary | ICD-10-CM

## 2022-06-03 DIAGNOSIS — J90 Pleural effusion, not elsewhere classified: Secondary | ICD-10-CM | POA: Diagnosis not present

## 2022-06-03 DIAGNOSIS — Z87891 Personal history of nicotine dependence: Secondary | ICD-10-CM | POA: Insufficient documentation

## 2022-06-03 DIAGNOSIS — I110 Hypertensive heart disease with heart failure: Secondary | ICD-10-CM | POA: Insufficient documentation

## 2022-06-03 DIAGNOSIS — Z95811 Presence of heart assist device: Secondary | ICD-10-CM | POA: Diagnosis not present

## 2022-06-03 DIAGNOSIS — I472 Ventricular tachycardia, unspecified: Secondary | ICD-10-CM

## 2022-06-03 DIAGNOSIS — R0609 Other forms of dyspnea: Secondary | ICD-10-CM | POA: Insufficient documentation

## 2022-06-03 DIAGNOSIS — Z8616 Personal history of COVID-19: Secondary | ICD-10-CM | POA: Diagnosis not present

## 2022-06-03 DIAGNOSIS — Z953 Presence of xenogenic heart valve: Secondary | ICD-10-CM | POA: Diagnosis not present

## 2022-06-03 DIAGNOSIS — R911 Solitary pulmonary nodule: Secondary | ICD-10-CM | POA: Insufficient documentation

## 2022-06-03 DIAGNOSIS — Z955 Presence of coronary angioplasty implant and graft: Secondary | ICD-10-CM | POA: Insufficient documentation

## 2022-06-03 DIAGNOSIS — I5022 Chronic systolic (congestive) heart failure: Secondary | ICD-10-CM

## 2022-06-03 DIAGNOSIS — I480 Paroxysmal atrial fibrillation: Secondary | ICD-10-CM | POA: Diagnosis not present

## 2022-06-03 DIAGNOSIS — Z79899 Other long term (current) drug therapy: Secondary | ICD-10-CM | POA: Insufficient documentation

## 2022-06-03 DIAGNOSIS — Z7901 Long term (current) use of anticoagulants: Secondary | ICD-10-CM | POA: Diagnosis not present

## 2022-06-03 DIAGNOSIS — M459 Ankylosing spondylitis of unspecified sites in spine: Secondary | ICD-10-CM | POA: Diagnosis not present

## 2022-06-03 DIAGNOSIS — I2510 Atherosclerotic heart disease of native coronary artery without angina pectoris: Secondary | ICD-10-CM | POA: Diagnosis not present

## 2022-06-03 DIAGNOSIS — I252 Old myocardial infarction: Secondary | ICD-10-CM | POA: Insufficient documentation

## 2022-06-03 DIAGNOSIS — Z952 Presence of prosthetic heart valve: Secondary | ICD-10-CM

## 2022-06-03 DIAGNOSIS — I213 ST elevation (STEMI) myocardial infarction of unspecified site: Secondary | ICD-10-CM

## 2022-06-03 LAB — COMPREHENSIVE METABOLIC PANEL
ALT: 18 U/L (ref 0–44)
AST: 21 U/L (ref 15–41)
Albumin: 3.5 g/dL (ref 3.5–5.0)
Alkaline Phosphatase: 121 U/L (ref 38–126)
Anion gap: 8 (ref 5–15)
BUN: 18 mg/dL (ref 8–23)
CO2: 23 mmol/L (ref 22–32)
Calcium: 8.8 mg/dL — ABNORMAL LOW (ref 8.9–10.3)
Chloride: 104 mmol/L (ref 98–111)
Creatinine, Ser: 1.07 mg/dL (ref 0.61–1.24)
GFR, Estimated: >60 mL/min (ref >60)
Glucose, Bld: 94 mg/dL (ref 70–99)
Potassium: 4.2 mmol/L (ref 3.5–5.1)
Sodium: 135 mmol/L (ref 135–145)
Total Bilirubin: 0.2 mg/dL — ABNORMAL LOW (ref 0.3–1.2)
Total Protein: 7 g/dL (ref 6.5–8.1)

## 2022-06-03 LAB — CBC
HCT: 43.1 % (ref 39.0–52.0)
Hemoglobin: 13.9 g/dL (ref 13.0–17.0)
MCH: 26.4 pg (ref 26.0–34.0)
MCHC: 32.3 g/dL (ref 30.0–36.0)
MCV: 81.9 fL (ref 80.0–100.0)
Platelets: 178 10*3/uL (ref 150–400)
RBC: 5.26 MIL/uL (ref 4.22–5.81)
RDW: 16.9 % — ABNORMAL HIGH (ref 11.5–15.5)
WBC: 6.8 10*3/uL (ref 4.0–10.5)
nRBC: 0 % (ref 0.0–0.2)

## 2022-06-03 LAB — PROTIME-INR
INR: 3.2 — ABNORMAL HIGH (ref 0.8–1.2)
Prothrombin Time: 32.7 seconds — ABNORMAL HIGH (ref 11.4–15.2)

## 2022-06-03 LAB — PREALBUMIN: Prealbumin: 20 mg/dL (ref 18–38)

## 2022-06-03 LAB — LACTATE DEHYDROGENASE: LDH: 156 U/L (ref 98–192)

## 2022-06-03 MED ORDER — SERTRALINE HCL 50 MG PO TABS: 50.0000 mg | ORAL_TABLET | Freq: Every day | ORAL | 6 refills | Status: DC

## 2022-06-03 NOTE — Progress Notes (Signed)
Patient presents for hospital d/c f/u with 1 yr Intermacs and annual maintenance  in [facility] today alone. Reports no problems with VAD equipment or concerns with drive line.  Patient says he is feeling "great" and has no limitations with activities, but does report feeling "nervous" all of the time since December following hospitalization. Denies dizziness, falls, shortness of breath (other than with exercise), and signs of bleeding. Reports occasional lightheadedness but this resolved quickly with rest.  Discussed mental health concerns with Dr [name]. Will restart Zoloft 25 mg daily x 1 week, then increase to 50 mg daily. Prescription sent to pt's pharmacy. Per Dr [name] will refer patient back to cardiac rehab for therapy following STEMI in December. Referral sent.   Slight increase in number of daily PI events. Pt drinking less than 2 L per day. Encouraged to increase PO intake to 2 L. He verbalized understanding.   Pt recently had follow up with Dr Roxan Hockey for lung nodule found on recent CT scan. Plan for PET scan next week to determine plan for treatment. Has f/u scheduled in February with Dr Roxan Hockey.   Vital Signs:    Doppler Pressure: 84 Automatc BP:  127/69 (95) HR: 80    SPO2:  100% on RA   Weight: 210.8 w/ ept Last weight: 208.4 lb w/ eqt Ht: 6'   VAD Interrogation: Speed: 5800 Flow: 5.3 Power: 4.8w    PI: 3.3 Alarms: none Events: 50 - 70 PI events daily  Fixed speed: 5800 Low speed limit: 5500  Primary controller: back up battery due for replacement in 19 months Secondary controller:  back up battery due for replacement in 20 months    I reviewed the LVAD parameters from today and compared the results to the patient's prior recorded data. LVAD interrogation was NEGATIVE for significant power changes, NEGATIVE for clinical alarms and STABLE for PI events/speed drops. No programming changes were made and pump is functioning within specified parameters. Pt  is performing daily controller and system monitor self tests along with completing weekly and monthly maintenance for LVAD equipment.   LVAD equipment check completed and is in good working order. Back-up equipment  present.    Annual Equipment Maintenance on UBC/PM was performed on 06/03/22.   Exit Site Care: Dressing CDI. No redness, tenderness, drainage or odor reported by patient. Will continue weekly dressings.  Provided patient with 8 weekly kits.  Device: N/A  (removed due to infection 12/04/20)   BP & Labs:  Doppler 84 - Doppler is reflecting modified systolic   Hgb 54.6  - No S/S of bleeding. Specifically denies melena/BRBPR or nosebleeds.   LDH stable at pending with established baseline of 135 - 309. Denies tea-colored urine. No power elevations noted on interrogation.   1 year Intermacs follow up completed including:  Quality of Life, KCCQ-12, and Neurocognitive trail making.   Pt completed 870 feet during 6 minute walk.  Back up controller: 11V backup battery charged during this visit.  Patient Goals: For my mental health to improve following my "episode" (referring to STEMI) in December.   [place] Cardiomyopathy Questionnaire     06/03/2022   12:32 PM 10/11/2021    1:40 PM 06/28/2021   11:51 AM  KCCQ-12  1 a. Ability to shower/bathe Not at all limited Slightly limited Other, Did not do  1 b. Ability to walk 1 block Moderately limited Slightly limited Slightly limited  1 c. Ability to hurry/jog Quite a bit limited Moderately limited Moderately limited  2. Edema feet/ankles/legs Never over the past 2 weeks Never over the past 2 weeks Never over the past 2 weeks  3. Limited by fatigue 3+ times per week, not every day Less than once a week 1-2 times a week  4. Limited by dyspnea 3+ times a week, not every day 1-2 times a week 1-2 times a week  5. Sitting up / on 3+ pillows Never over the past 2 weeks Never over the past 2 weeks 1-2 times a week  6. Limited enjoyment  of life Moderately limited Slightly limited Moderately limited  7. Rest of life w/ symptoms Mostly satisfied Mostly satisfied Somewhat satisfied  8 a. Participation in hobbies Slightly limited N/A, did not do for other reasons Moderately limited  8 b. Participation in chores Slightly limited N/A, did not do for other reasons Slightly limited  8 c. Visiting family/friends Did not limit at all N/A, did not do for other reasons Slightly limited     Batteries Manufacture Date: Number of uses: Re-calibration  03/20/21 96/109 Performed by patient- refreshed pt on how to complete calibration   Annual maintenance completed per [employer] on patient's home power module and universal Magazine features editor.     Patient Instructions:  Start Zoloft 25 mg daily Take 25 mg daily for 1 week, then increase to 50 mg daily. Coumadin dosing per Lauren PharmD Return to clinic in 2 months for follow up with Dr Gala Romney We will refer you to cardiac rehab  Alyce Pagan RN VAD Coordinator  Office: [phone]  24/7 Pager: [phone]

## 2022-06-03 NOTE — Addendum Note (Signed)
Addended by: Alyce Pagan B on: 06/03/2022 10:38 AM   Modules accepted: Orders

## 2022-06-03 NOTE — Patient Instructions (Addendum)
Start Zoloft 25 mg daily Take 25 mg daily for 1 week, then increase to 50 mg daily. Coumadin dosing per Lauren PharmD Return to clinic in 2 months for follow up with Dr Gala Romney We will refer you to cardiac rehab

## 2022-06-12 ENCOUNTER — Encounter (HOSPITAL_COMMUNITY)
Admission: RE | Admit: 2022-06-12 | Discharge: 2022-06-12 | Disposition: A | Discharge status: Home / Self Care | Payer: Medicare Other | Source: Ambulatory Visit | Attending: Thoracic Surgery (Cardiothoracic Vascular Surgery) | Admitting: Thoracic Surgery (Cardiothoracic Vascular Surgery)

## 2022-06-12 ENCOUNTER — Other Ambulatory Visit (HOSPITAL_COMMUNITY): Payer: Self-pay

## 2022-06-12 ENCOUNTER — Ambulatory Visit (HOSPITAL_COMMUNITY)
Admission: RE | Admit: 2022-06-12 | Discharge: 2022-06-12 | Disposition: A | Discharge status: Home / Self Care | Payer: Medicare Other | Source: Ambulatory Visit | Attending: Thoracic Surgery (Cardiothoracic Vascular Surgery) | Admitting: Thoracic Surgery (Cardiothoracic Vascular Surgery)

## 2022-06-12 DIAGNOSIS — R911 Solitary pulmonary nodule: Secondary | ICD-10-CM

## 2022-06-12 LAB — GLUCOSE, CAPILLARY: Glucose-Capillary: 117 mg/dL — ABNORMAL HIGH (ref 70–99)

## 2022-06-12 MED ORDER — FLUDEOXYGLUCOSE F - 18 (FDG) INJECTION
8.9000 | Freq: Once | INTRAVENOUS | Status: AC
  Administered 2022-06-12: 8.9 via INTRAVENOUS

## 2022-06-13 ENCOUNTER — Ambulatory Visit (HOSPITAL_COMMUNITY): Payer: Self-pay | Admitting: Pharmacist

## 2022-06-13 LAB — POCT INR: INR: 3.1 — AB (ref 2.0–3.0)

## 2022-06-15 LAB — ECHO TEE: Est EF: <20

## 2022-06-18 ENCOUNTER — Encounter: Payer: Medicare Other | Admitting: Thoracic Surgery (Cardiothoracic Vascular Surgery)

## 2022-06-20 ENCOUNTER — Ambulatory Visit (HOSPITAL_COMMUNITY): Payer: Self-pay | Admitting: Pharmacist

## 2022-06-20 LAB — POCT INR: INR: 2.4 (ref 2.0–3.0)

## 2022-06-26 ENCOUNTER — Other Ambulatory Visit (HOSPITAL_COMMUNITY): Payer: Self-pay | Admitting: Internal Medicine

## 2022-06-26 DIAGNOSIS — F32A Depression, unspecified: Secondary | ICD-10-CM

## 2022-06-26 DIAGNOSIS — I5022 Chronic systolic (congestive) heart failure: Secondary | ICD-10-CM

## 2022-06-26 DIAGNOSIS — Z95811 Presence of heart assist device: Secondary | ICD-10-CM

## 2022-06-27 ENCOUNTER — Ambulatory Visit (HOSPITAL_COMMUNITY): Payer: Self-pay | Admitting: Pharmacist

## 2022-06-27 LAB — POCT INR: INR: 2.7 (ref 2.0–3.0)

## 2022-07-03 ENCOUNTER — Telehealth (HOSPITAL_COMMUNITY): Payer: Self-pay

## 2022-07-03 NOTE — Telephone Encounter (Signed)
Called patient to see if he was interested in participating in the Cardiac Rehab Program. Patient stated yes. Patient will come in for orientation on 07/04/22 @ 10:30AM and will attend the 1:45PM exercise class. Went over insurance, patient verbalized understanding.

## 2022-07-03 NOTE — Telephone Encounter (Signed)
Pt insurance is active and benefits verified through Medicare A/B. Co-pay $0.00, DED $240.00/$240.00 met, out of pocket 0$0.00/$0.00 met, co-insurance 20%. No pre-authorization required. Passport, 07/03/22 @ 11:43AM, REF#[id number]   How many CR sessions are covered? (36 sessions for TCR, 72 sessions for ICR)72 Is this a lifetime maximum or an annual maximum? Lifetime Has the member used any of these services to date? No Is there a time limit (weeks/months) on start of program and/or program completion? No   2ndary insurance is active and benefits verified through [employer]. Co-pay $0.00, DED $0.00/$0.00 met, out of pocket $0.00/$0.00 met, co-insurance 0%. No pre-authorization required. Passport, 07/03/22 @ 11:53AM, BFX#[id number]    Will contact patient to see if he is interested in the Cardiac Rehab Program.

## 2022-07-04 ENCOUNTER — Encounter (HOSPITAL_COMMUNITY)
Admission: RE | Admit: 2022-07-04 | Discharge: 2022-07-04 | Disposition: A | Discharge status: Home / Self Care | Payer: Medicare Other | Source: Ambulatory Visit | Attending: Internal Medicine | Admitting: Internal Medicine

## 2022-07-04 ENCOUNTER — Ambulatory Visit (HOSPITAL_COMMUNITY): Payer: Self-pay | Admitting: Pharmacist

## 2022-07-04 ENCOUNTER — Encounter (HOSPITAL_COMMUNITY): Payer: Self-pay

## 2022-07-04 VITALS — BP 84/0 | HR 96 | Ht 72.0 in | Wt 212.1 lb

## 2022-07-04 DIAGNOSIS — I5042 Chronic combined systolic (congestive) and diastolic (congestive) heart failure: Secondary | ICD-10-CM | POA: Diagnosis present

## 2022-07-04 DIAGNOSIS — I2102 ST elevation (STEMI) myocardial infarction involving left anterior descending coronary artery: Secondary | ICD-10-CM | POA: Diagnosis present

## 2022-07-04 LAB — POCT INR: INR: 2.5 (ref 2.0–3.0)

## 2022-07-04 NOTE — Progress Notes (Addendum)
Shun is here for cardiac rehab QRS/ r to r interval appears to be somewhat irregular. Patient asymptomatic MAP 84. Oxygen saturation 975 on room air. LVAD coordinator Tanda Rockers LVAD coordinator paged and notified. Will forward today's ECG tracings for review.Harrell Gave RN BSN   Instant message to Luberta Mutter RN LVAD coordinator  Dr Aundra Dubin and I looked at this and it appears to be NSR w/PVCs he says it is normal and nothing to worry about unless he starts to feel bad  Judson Roch she will contact the patient.  Will continue to monitor the patient throughout  the program. Harrell Gave RN BSN

## 2022-07-04 NOTE — Progress Notes (Signed)
Cardiac Rehab Medication Review by a Nurse  Does the patient  feel that his/her medications are working for him/her?  no  Has the patient been experiencing any side effects to the medications prescribed?  yes  Does the patient measure his/her own blood pressure or blood glucose at home?  no   Does the patient have any problems obtaining medications due to transportation or finances?   no  Understanding of regimen: good Understanding of indications: good Potential of compliance: good  Eagle thinks that he is grinding his teeth due to taking Zoloft and would like to wean off it. Shafer plans to discuss with either Dr Volanda Napoleon or the [facility]    Nurse comments: Breyden is taking his medications as prescribed and has a good understanding of what his medications are for.    Christa See [facility] RN 07/04/2022 10:42 AM

## 2022-07-04 NOTE — Progress Notes (Signed)
Cardiac Individual Treatment Plan  Patient Details  Name: Brandon [name] MRN: [id number] Date of Birth: [birth year] Referring Provider:   [place] from 07/04/2022 in [facility]  Referring Provider Dr. [name]       Initial Encounter Date:  Flowsheet Row INTENSIVE CARDIAC REHAB ORIENT from 07/04/2022 in [facility] [place]  Date 07/04/22       Visit Diagnosis: 05/06/22 STEMI, Med [place], 04/22/22 VFIB  Patient's Home Medications on Admission:  Current Outpatient Medications:    acetaminophen (TYLENOL) 500 MG tablet, Take 500-1,000 mg by mouth every 6 (six) hours as needed (for pain)., Disp: , Rfl:    aspirin EC 81 MG tablet, Take 1 tablet (81 mg total) by mouth daily. Swallow whole., Disp: 90 tablet, Rfl: 1   atorvastatin (LIPITOR) 80 MG tablet, TAKE 1 TABLET BY MOUTH ONCE DAILY AT 6PM. (Patient taking differently: Take 80 mg by mouth at bedtime.), Disp: 90 tablet, Rfl: 3   Carboxymethylcellulose Sodium (ARTIFICIAL TEARS OP), Place 1 drop into both eyes daily as needed (for dryness or irritation)., Disp: , Rfl:    eplerenone (INSPRA) 50 MG tablet, Take 1 tablet (50 mg total) by mouth in the morning., Disp: 30 tablet, Rfl: 6   ezetimibe (ZETIA) 10 MG tablet, Take 1 tablet (10 mg total) by mouth daily., Disp: 90 tablet, Rfl: 3   gabapentin (NEURONTIN) 300 MG capsule, Take 1 capsule (300 mg total) by mouth 3 (three) times daily., Disp: 90 capsule, Rfl: 3   losartan (COZAAR) 25 MG tablet, Take 0.5 tablets (12.5 mg total) by mouth daily., Disp: 30 tablet, Rfl: 5   metoprolol succinate (TOPROL-XL) 25 MG 24 hr tablet, Take 0.5 tablets (12.5 mg total) by mouth daily., Disp: 45 tablet, Rfl: 1   pantoprazole (PROTONIX) 40 MG tablet, Take 1 tablet (40 mg total) by mouth 2 (two) times daily., Disp: 60 tablet, Rfl: 3   potassium chloride SA (KLOR-CON M) 20  MEQ tablet, Take 1 tablet (20 mEq total) by mouth daily., Disp: 90 tablet, Rfl: 3   sertraline (ZOLOFT) 50 MG tablet, TAKE 1/2 TAB BY MOUTH DAILY FOR 1 WEEK, THEN INCREASE TO 1 TABLET DAILY., Disp: 90 tablet, Rfl: 3   tamsulosin (FLOMAX) 0.4 MG CAPS capsule, Take 1 capsule (0.4 mg total) by mouth daily., Disp: 90 capsule, Rfl: 3   torsemide (DEMADEX) 20 MG tablet, Take 1 tablet (20 mg total) by mouth as needed., Disp: 30 tablet, Rfl: 5   traMADol (ULTRAM) 50 MG tablet, TAKE 1-2 TABLETS BY MOUTH TWICE A DAY FOR 30 DAYS, Disp: 120 tablet, Rfl: 1   warfarin (COUMADIN) 2.5 MG tablet, Take 7.5 mg (3 tablets) by mouth daily or as instructed by [facility] clinic. (Patient taking differently: Take 2.5 mg by mouth daily at 4 PM. Take 5 mg (2 tablets) on Monday, and 7.5 mg (3 tablets) by mouth daily all other days, or as instructed by [facility] clinic.), Disp: 270 tablet, Rfl: 1   ZYRTEC ALLERGY 10 MG tablet, Take 10 mg by mouth daily., Disp: , Rfl:    melatonin 5 MG TABS, Take 1 tablet (5 mg total) by mouth at bedtime. (Patient not taking: Reported on 07/04/2022), Disp: 30 tablet, Rfl: 6   ondansetron (ZOFRAN) 4 MG tablet, Take 1 tablet (4 mg total) by mouth every 8 (eight) hours as needed for nausea or vomiting. (Patient not taking: Reported on 06/03/2022),  Disp: 20 tablet, Rfl: 3  Past Medical History: Past Medical History:  Diagnosis Date   AICD (automatic cardioverter/defibrillator) present 08/31/2019   Ankylosing spondylitis ([place])    Arthritis    BENIGN PROSTATIC HYPERTROPHY 06/07/2008   CHF (congestive heart failure) ([place])    COLONIC POLYPS, HX OF 06/07/2008   Coronary artery disease    Depression    H/O hiatal hernia    Heart failure ([place])    HYPERLIPIDEMIA 06/07/2008   HYPERTENSION 06/07/2008   Myocardial infarction [facility]) 2005   NSTEMI, s/p LAD stent   NEPHROLITHIASIS, HX OF 06/07/2008   STEMI (ST elevation myocardial infarction) ([place]) 04/27/2019    Tobacco Use: Social History   Tobacco Use   Smoking Status Former   Packs/day: 1.00   Years: 40.00   Total pack years: 40.00   Types: Cigarettes   Quit date: 07/26/2019   Years since quitting: 2.9  Smokeless Tobacco Never  Tobacco Comments   1-1.5 pks per year x 40-45 yrs    Labs: Review Flowsheet  More data exists      Latest Ref Rng & Units 04/28/2021 05/04/2021 05/06/2022 05/07/2022 05/08/2022  Labs for ITP Cardiac and Pulmonary Rehab  PH, Arterial 7.35 - 7.45 - - 7.371  7.378  - -  PCO2 arterial 32 - 48 mmHg - - 31.4  36.5  - -  Bicarbonate 20.0 - 28.0 mmol/L - - 24.8  18.2  21.5  - -  TCO2 22 - 32 mmol/L - - 26  19  23   - -  Acid-base deficit 0.0 - 2.0 mmol/L - - 1.0  6.0  3.0  - -  O2 Saturation % 36.8  51.2  60  92  91  71.7  75.6     Capillary Blood Glucose: Lab Results  Component Value Date   GLUCAP 117 (H) 06/12/2022   GLUCAP 122 (H) 05/06/2022   GLUCAP 100 (H) 05/09/2021   GLUCAP 84 05/09/2021   GLUCAP 122 (H) 05/08/2021     Exercise Target Goals: Exercise Program Goal: Individual exercise prescription set using results from initial 6 min walk test and THRR while considering  patient's activity barriers and safety.   Exercise Prescription Goal: Initial exercise prescription builds to 30-45 minutes a day of aerobic activity, 2-3 days per week.  Home exercise guidelines will be given to patient during program as part of exercise prescription that the participant will acknowledge.  Activity Barriers & Risk Stratification:  Activity Barriers & Cardiac Risk Stratification - 07/04/22 1332       Activity Barriers & Cardiac Risk Stratification   Activity Barriers Joint Problems;Deconditioning;Arthritis;Back Problems;Shortness of Breath;Neck/Spine Problems;Balance Concerns    Cardiac Risk Stratification High             6 Minute Walk:  6 Minute Walk     Row Name 07/04/22 1323         6 Minute Walk   Phase Initial     Distance 1040 feet     Walk Time 6 minutes     # of Rest Breaks 0      MPH 1.97     METS 2.38     RPE 12     Perceived Dyspnea  1     VO2 Peak 8.32     Symptoms Yes (comment)     Comments bilateral hip pain 5/10 chronic. 5/10 back neck bain chronic. Relieved with rest. Rollator used midway through walk test due to unsteadiness     Resting  HR 96 bpm     Resting BP 84/0  VAD-MAP     Resting Oxygen Saturation  97 %     Exercise Oxygen Saturation  during 6 min walk 95 %     Max Ex. HR 125 bpm     Max Ex. BP 86/0  VAD-MAP     2 Minute Post BP 80/0  VAD-MAP              Oxygen Initial Assessment:   Oxygen Re-Evaluation:   Oxygen Discharge (Final Oxygen Re-Evaluation):   Initial Exercise Prescription:  Initial Exercise Prescription - 07/04/22 1300       Date of Initial Exercise RX and Referring Provider   Date 07/04/22    Referring Provider Dr. [name]    Expected Discharge Date 09/13/22      Recumbant Bike   Level 1    RPM 65    Minutes 15    METs 1.8      NuStep   Level 1    SPM 85    Minutes 15    METs 1.8      Prescription Details   Frequency (times per week) 3    Duration Progress to 30 minutes of continuous aerobic without signs/symptoms of physical distress      Intensity   THRR 40-80% of Max Heartrate 61-122    Ratings of Perceived Exertion 11-13    Perceived Dyspnea 0-4      Progression   Progression Continue progressive overload as per policy without signs/symptoms or physical distress.      Resistance Training   Training Prescription Yes    Weight 3 lbs    Reps 10-15             Perform Capillary Blood Glucose checks as needed.  Exercise Prescription Changes:   Exercise Prescription Changes     Row Name 07/04/22 1300             Response to Exercise   Blood Pressure (Admit) 84/0                Exercise Comments:   Exercise Goals and Review:   Exercise Goals     Row Name 07/04/22 1347             Exercise Goals   Increase Physical Activity Yes       Intervention Provide  advice, education, support and counseling about physical activity/exercise needs.;Develop an individualized exercise prescription for aerobic and resistive training based on initial evaluation findings, risk stratification, comorbidities and participant's personal goals.       Expected Outcomes Short Term: Attend rehab on a regular basis to increase amount of physical activity.;Long Term: Add in home exercise to make exercise part of routine and to increase amount of physical activity.;Long Term: Exercising regularly at least 3-5 days a week.       Able to understand and use rate of perceived exertion (RPE) scale Yes       Intervention Provide education and explanation on how to use RPE scale       Expected Outcomes Short Term: Able to use RPE daily in rehab to express subjective intensity level;Long Term:  Able to use RPE to guide intensity level when exercising independently       Knowledge and understanding of Target Heart Rate Range (THRR) Yes       Intervention Provide education and explanation of THRR including how the numbers were predicted and where they are located for  reference       Expected Outcomes Short Term: Able to state/look up THRR;Long Term: Able to use THRR to govern intensity when exercising independently;Short Term: Able to use daily as guideline for intensity in rehab       Understanding of Exercise Prescription Yes       Intervention Provide education, explanation, and written materials on patient's individual exercise prescription       Expected Outcomes Short Term: Able to explain program exercise prescription;Long Term: Able to explain home exercise prescription to exercise independently                Exercise Goals Re-Evaluation :   Discharge Exercise Prescription (Final Exercise Prescription Changes):  Exercise Prescription Changes - 07/04/22 1300       Response to Exercise   Blood Pressure (Admit) 84/0             Nutrition:  Target Goals:  Understanding of nutrition guidelines, daily intake of sodium 1500mg , cholesterol 200mg , calories 30% from fat and 7% or less from saturated fats, daily to have 5 or more servings of fruits and vegetables.  Biometrics:  Pre Biometrics - 07/04/22 1319       Pre Biometrics   Height 6' (1.829 m)   Will finish Pre-biometrics on patients 1st day of exercise             Nutrition Therapy Plan and Nutrition Goals:   Nutrition Assessments:  MEDIFICTS Score Key: ?70 Need to make dietary changes  40-70 Heart Healthy Diet ? 40 Therapeutic Level Cholesterol Diet    Picture Your Plate Scores: <65 Unhealthy dietary pattern with much room for improvement. 41-50 Dietary pattern unlikely to meet recommendations for good health and room for improvement. 51-60 More healthful dietary pattern, with some room for improvement.  >60 Healthy dietary pattern, although there may be some specific behaviors that could be improved.    Nutrition Goals Re-Evaluation:   Nutrition Goals Re-Evaluation:   Nutrition Goals Discharge (Final Nutrition Goals Re-Evaluation):   Psychosocial: Target Goals: Acknowledge presence or absence of significant depression and/or stress, maximize coping skills, provide positive support system. Participant is able to verbalize types and ability to use techniques and skills needed for reducing stress and depression.  Initial Review & Psychosocial Screening:  Initial Psych Review & Screening - 07/04/22 1348       Initial Review   Current issues with Current Stress Concerns;History of Depression;Current Anxiety/Panic    Source of Stress Concerns Chronic Illness    Comments Misha has a history of depression currently taking Zoloft. Martise says he is grinding his teeth at night. Wants to discuss weaning off Zoloft. Ayomikun admits to experiencing some anxiety since his two hospitaliztoins in December.      Family Dynamics   Good Support System? Yes   Dorien lives alone he  has a sister who lives in the area for support     Barriers   Psychosocial barriers to participate in program The patient should benefit from training in stress management and relaxation.      Screening Interventions   Interventions Encouraged to exercise    Expected Outcomes Long Term Goal: Stressors or current issues are controlled or eliminated.;Short Term goal: Identification and review with participant of any Quality of Life or Depression concerns found by scoring the questionnaire.             Quality of Life Scores:  Quality of Life - 07/04/22 1349  Quality of Life   Select Quality of Life      Quality of Life Scores   Health/Function Pre 18.7 %    Socioeconomic Pre 20.86 %    Psych/Spiritual Pre 16.57 %    Family Pre 17 %    GLOBAL Pre 18.5 %            Scores of 19 and below usually indicate a poorer quality of life in these areas.  A difference of  2-3 points is a clinically meaningful difference.  A difference of 2-3 points in the total score of the Quality of Life Index has been associated with significant improvement in overall quality of life, self-image, physical symptoms, and general health in studies assessing change in quality of life.  PHQ-9: Review Flowsheet  More data exists      07/04/2022 02/22/2022 11/02/2021 09/24/2021 09/12/2021  Depression screen PHQ 2/9  Decreased Interest 0 0 0 0 0  Down, Depressed, Hopeless 0 0 0 0 0  PHQ - 2 Score 0 0 0 0 0  Altered sleeping 1 0 1 - 1  Tired, decreased energy 0 0 1 - 1  Change in appetite 0 0 0 - 0  Feeling bad or failure about yourself  0 0 0 - 0  Trouble concentrating 0 0 0 - 1  Moving slowly or fidgety/restless 0 0 0 - 0  Suicidal thoughts 0 0 0 - 0  PHQ-9 Score 1 0 2 - 3  Difficult doing work/chores Not difficult at all - Not difficult at all - Not difficult at all   Interpretation of Total Score  Total Score Depression Severity:  1-4 = Minimal depression, 5-9 = Mild depression, 10-14 =  Moderate depression, 15-19 = Moderately severe depression, 20-27 = Severe depression   Psychosocial Evaluation and Intervention:   Psychosocial Re-Evaluation:   Psychosocial Discharge (Final Psychosocial Re-Evaluation):   Vocational Rehabilitation: Provide vocational rehab assistance to qualifying candidates.   Vocational Rehab Evaluation & Intervention:  Vocational Rehab - 07/04/22 1353       Initial Vocational Rehab Evaluation & Intervention   Assessment shows need for Vocational Rehabilitation No   Edric is retired and does not need vocational rehab at this time            Education: Education Goals: Education classes will be provided on a weekly basis, covering required topics. Participant will state understanding/return demonstration of topics presented.     Core Videos: Exercise    Move It!  Clinical staff conducted group or individual video education with verbal and written material and guidebook.  Patient learns the recommended Pritikin exercise program. Exercise with the goal of living a long, healthy life. Some of the health benefits of exercise include controlled diabetes, healthier blood pressure levels, improved cholesterol levels, improved heart and lung capacity, improved sleep, and better body composition. Everyone should speak with their doctor before starting or changing an exercise routine.  Biomechanical Limitations Clinical staff conducted group or individual video education with verbal and written material and guidebook.  Patient learns how biomechanical limitations can impact exercise and how we can mitigate and possibly overcome limitations to have an impactful and balanced exercise routine.  Body Composition Clinical staff conducted group or individual video education with verbal and written material and guidebook.  Patient learns that body composition (ratio of muscle mass to fat mass) is a key component to assessing overall fitness, rather than  body weight alone. Increased fat mass, especially visceral belly fat, can  put [place] at increased risk for metabolic syndrome, type 2 diabetes, heart disease, and even death. It is recommended to combine diet and exercise (cardiovascular and resistance training) to improve your body composition. Seek guidance from your physician and exercise physiologist before implementing an exercise routine.  Exercise Action Plan Clinical staff conducted group or individual video education with verbal and written material and guidebook.  Patient learns the recommended strategies to achieve and enjoy long-term exercise adherence, including variety, self-motivation, self-efficacy, and positive decision making. Benefits of exercise include fitness, good health, weight management, more energy, better sleep, less stress, and overall well-being.  Medical   Heart Disease Risk Reduction Clinical staff conducted group or individual video education with verbal and written material and guidebook.  Patient learns our heart is our most vital organ as it circulates oxygen, nutrients, white blood cells, and hormones throughout the entire body, and carries waste away. Data supports a plant-based eating plan like the Pritikin Program for its effectiveness in slowing progression of and reversing heart disease. The video provides a number of recommendations to address heart disease.   Metabolic Syndrome and Belly Fat  Clinical staff conducted group or individual video education with verbal and written material and guidebook.  Patient learns what metabolic syndrome is, how it leads to heart disease, and how one can reverse it and keep it from coming back. You have metabolic syndrome if you have 3 of the following 5 criteria: abdominal obesity, high blood pressure, high triglycerides, low HDL cholesterol, and high blood sugar.  Hypertension and Heart Disease Clinical staff conducted group or individual video education with verbal and  written material and guidebook.  Patient learns that high blood pressure, or hypertension, is very common in the [place]. Hypertension is largely due to excessive salt intake, but other important risk factors include being overweight, physical inactivity, drinking too much alcohol, smoking, and not eating enough potassium from fruits and vegetables. High blood pressure is a leading risk factor for heart attack, stroke, congestive heart failure, dementia, kidney failure, and premature death. Long-term effects of excessive salt intake include stiffening of the arteries and thickening of heart muscle and organ damage. Recommendations include ways to reduce hypertension and the risk of heart disease.  Diseases of Our Time - Focusing on Diabetes Clinical staff conducted group or individual video education with verbal and written material and guidebook.  Patient learns why the best way to stop diseases of our time is prevention, through food and other lifestyle changes. Medicine (such as prescription pills and surgeries) is often only a Band-Aid on the problem, not a long-term solution. Most common diseases of our time include obesity, type 2 diabetes, hypertension, heart disease, and cancer. The Pritikin Program is recommended and has been proven to help reduce, reverse, and/or prevent the damaging effects of metabolic syndrome.  Nutrition   Overview of the Pritikin Eating Plan  Clinical staff conducted group or individual video education with verbal and written material and guidebook.  Patient learns about the [place] for disease risk reduction. The [place] emphasizes a wide variety of unrefined, minimally-processed carbohydrates, like fruits, vegetables, whole grains, and legumes. Go, Caution, and Stop food choices are explained. Plant-based and lean animal proteins are emphasized. Rationale provided for low sodium intake for blood pressure control, low added sugars for blood  sugar stabilization, and low added fats and oils for coronary artery disease risk reduction and weight management.  Calorie Density  Clinical staff conducted group or individual video  education with verbal and written material and guidebook.  Patient learns about calorie density and how it impacts the Pritikin Eating Plan. Knowing the characteristics of the food you choose will help you decide whether those foods will lead to weight gain or weight loss, and whether you want to consume more or less of them. Weight loss is usually a side effect of the Pritikin Eating Plan because of its focus on low calorie-dense foods.  Label Reading  Clinical staff conducted group or individual video education with verbal and written material and guidebook.  Patient learns about the Pritikin recommended label reading guidelines and corresponding recommendations regarding calorie density, added sugars, sodium content, and whole grains.  Dining Out - Part 1  Clinical staff conducted group or individual video education with verbal and written material and guidebook.  Patient learns that restaurant meals can be sabotaging because they can be so high in calories, fat, sodium, and/or sugar. Patient learns recommended strategies on how to positively address this and avoid unhealthy pitfalls.  Facts on Fats  Clinical staff conducted group or individual video education with verbal and written material and guidebook.  Patient learns that lifestyle modifications can be just as effective, if not more so, as many medications for lowering your risk of heart disease. A Pritikin lifestyle can help to reduce your risk of inflammation and atherosclerosis (cholesterol build-up, or plaque, in the artery walls). Lifestyle interventions such as dietary choices and physical activity address the cause of atherosclerosis. A review of the types of fats and their impact on blood cholesterol levels, along with dietary recommendations to reduce  fat intake is also included.  Nutrition Action Plan  Clinical staff conducted group or individual video education with verbal and written material and guidebook.  Patient learns how to incorporate Pritikin recommendations into their lifestyle. Recommendations include planning and keeping personal health goals in mind as an important part of their success.  Healthy Mind-Set    Healthy Minds, Bodies, Hearts  Clinical staff conducted group or individual video education with verbal and written material and guidebook.  Patient learns how to identify when they are stressed. Video will discuss the impact of that stress, as well as the many benefits of stress management. Patient will also be introduced to stress management techniques. The way we think, act, and feel has an impact on our hearts.  How Our Thoughts Can Heal Our Hearts  Clinical staff conducted group or individual video education with verbal and written material and guidebook.  Patient learns that negative thoughts can cause depression and anxiety. This can result in negative lifestyle behavior and serious health problems. Cognitive behavioral therapy is an effective method to help control our thoughts in order to change and improve our emotional outlook.  Additional Videos:  Exercise    Improving Performance  Clinical staff conducted group or individual video education with verbal and written material and guidebook.  Patient learns to use a non-linear approach by alternating intensity levels and lengths of time spent exercising to help burn more calories and lose more body fat. Cardiovascular exercise helps improve heart health, metabolism, hormonal balance, blood sugar control, and recovery from fatigue. Resistance training improves strength, endurance, balance, coordination, reaction time, metabolism, and muscle mass. Flexibility exercise improves circulation, posture, and balance. Seek guidance from your physician and exercise  physiologist before implementing an exercise routine and learn your capabilities and proper form for all exercise.  Introduction to Yoga  Clinical staff conducted group or individual video education with verbal  and written material and guidebook.  Patient learns about yoga, a discipline of the coming together of mind, breath, and body. The benefits of yoga include improved flexibility, improved range of motion, better posture and core strength, increased lung function, weight loss, and positive self-image. Yoga's heart health benefits include lowered blood pressure, healthier heart rate, decreased cholesterol and triglyceride levels, improved immune function, and reduced stress. Seek guidance from your physician and exercise physiologist before implementing an exercise routine and learn your capabilities and proper form for all exercise.  Medical   Aging: Enhancing Your Quality of Life  Clinical staff conducted group or individual video education with verbal and written material and guidebook.  Patient learns key strategies and recommendations to stay in good physical health and enhance quality of life, such as prevention strategies, having an advocate, securing a [place], and keeping a list of medications and system for tracking them. It also discusses how to avoid risk for bone loss.  Biology of Weight Control  Clinical staff conducted group or individual video education with verbal and written material and guidebook.  Patient learns that weight gain occurs because we consume more calories than we burn (eating more, moving less). Even if your body weight is normal, you may have higher ratios of fat compared to muscle mass. Too much body fat puts you at increased risk for cardiovascular disease, heart attack, stroke, type 2 diabetes, and obesity-related cancers. In addition to exercise, following the [place] can help reduce your risk.  Decoding Lab Results   Clinical staff conducted group or individual video education with verbal and written material and guidebook.  Patient learns that lab test reflects one measurement whose values change over time and are influenced by many factors, including medication, stress, sleep, exercise, food, hydration, pre-existing medical conditions, and more. It is recommended to use the knowledge from this video to become more involved with your lab results and evaluate your numbers to speak with your doctor.   Diseases of Our Time - Overview  Clinical staff conducted group or individual video education with verbal and written material and guidebook.  Patient learns that according to the CDC, 50% to 70% of chronic diseases (such as obesity, type 2 diabetes, elevated lipids, hypertension, and heart disease) are avoidable through lifestyle improvements including healthier food choices, listening to satiety cues, and increased physical activity.  Sleep Disorders Clinical staff conducted group or individual video education with verbal and written material and guidebook.  Patient learns how good quality and duration of sleep are important to overall health and well-being. Patient also learns about sleep disorders and how they impact health along with recommendations to address them, including discussing with a physician.  Nutrition  Dining Out - Part 2 Clinical staff conducted group or individual video education with verbal and written material and guidebook.  Patient learns how to plan ahead and communicate in order to maximize their dining experience in a healthy and nutritious manner. Included are recommended food choices based on the type of restaurant the patient is visiting.   Fueling a Best boy conducted group or individual video education with verbal and written material and guidebook.  There is a strong connection between our food choices and our health. Diseases like obesity and type 2 diabetes  are very prevalent and are in large-part due to lifestyle choices. The Pritikin Eating Plan provides plenty of food and hunger-curbing satisfaction. It is easy to follow, affordable, and  helps reduce health risks.  Menu Workshop  Clinical staff conducted group or individual video education with verbal and written material and guidebook.  Patient learns that restaurant meals can sabotage health goals because they are often packed with calories, fat, sodium, and sugar. Recommendations include strategies to plan ahead and to communicate with the manager, chef, or server to help order a healthier meal.  Planning Your Eating Strategy  Clinical staff conducted group or individual video education with verbal and written material and guidebook.  Patient learns about the [place] and its benefit of reducing the risk of disease. The [place] does not focus on calories. Instead, it emphasizes high-quality, nutrient-rich foods. By knowing the characteristics of the foods, we choose, we can determine their calorie density and make informed decisions.  Targeting Your Nutrition Priorities  Clinical staff conducted group or individual video education with verbal and written material and guidebook.  Patient learns that lifestyle habits have a tremendous impact on disease risk and progression. This video provides eating and physical activity recommendations based on your personal health goals, such as reducing LDL cholesterol, losing weight, preventing or controlling type 2 diabetes, and reducing high blood pressure.  Vitamins and Minerals  Clinical staff conducted group or individual video education with verbal and written material and guidebook.  Patient learns different ways to obtain key vitamins and minerals, including through a recommended healthy diet. It is important to discuss all supplements you take with your doctor.   Healthy Mind-Set    Smoking Cessation  Clinical staff  conducted group or individual video education with verbal and written material and guidebook.  Patient learns that cigarette smoking and tobacco addiction pose a serious health risk which affects millions of people. Stopping smoking will significantly reduce the risk of heart disease, lung disease, and many forms of cancer. Recommended strategies for quitting are covered, including working with your doctor to develop a successful plan.  Culinary   Becoming a Financial trader conducted group or individual video education with verbal and written material and guidebook.  Patient learns that cooking at home can be healthy, cost-effective, quick, and puts them in control. Keys to cooking healthy recipes will include looking at your recipe, assessing your equipment needs, planning ahead, making it simple, choosing cost-effective seasonal ingredients, and limiting the use of added fats, salts, and sugars.  Cooking - Breakfast and Snacks  Clinical staff conducted group or individual video education with verbal and written material and guidebook.  Patient learns how important breakfast is to satiety and nutrition through the entire day. Recommendations include key foods to eat during breakfast to help stabilize blood sugar levels and to prevent overeating at meals later in the day. Planning ahead is also a key component.  Cooking - Human resources officer conducted group or individual video education with verbal and written material and guidebook.  Patient learns eating strategies to improve overall health, including an approach to cook more at home. Recommendations include thinking of animal protein as a side on your plate rather than center stage and focusing instead on lower calorie dense options like vegetables, fruits, whole grains, and plant-based proteins, such as beans. Making sauces in large quantities to freeze for later and leaving the skin on your vegetables are also  recommended to maximize your experience.  Cooking - Healthy Salads and Dressing Clinical staff conducted group or individual video education with verbal and written material and guidebook.  Patient learns that vegetables,  fruits, whole grains, and legumes are the foundations of the [place]. Recommendations include how to incorporate each of these in flavorful and healthy salads, and how to create homemade salad dressings. Proper handling of ingredients is also covered. Cooking - Soups and [employer] - Soups and Desserts Clinical staff conducted group or individual video education with verbal and written material and guidebook.  Patient learns that Pritikin soups and desserts make for easy, nutritious, and delicious snacks and meal components that are low in sodium, fat, sugar, and calorie density, while high in vitamins, minerals, and filling fiber. Recommendations include simple and healthy ideas for soups and desserts.   Overview     The Pritikin Solution Program Overview Clinical staff conducted group or individual video education with verbal and written material and guidebook.  Patient learns that the results of the [place] Program have been documented in more than 100 articles published in peer-reviewed journals, and the benefits include reducing risk factors for (and, in some cases, even reversing) high cholesterol, high blood pressure, type 2 diabetes, obesity, and more! An overview of the three key pillars of the Pritikin Program will be covered: eating well, doing regular exercise, and having a healthy mind-set.  WORKSHOPS  Exercise: Exercise Basics: Building Your Action Plan Clinical staff led group instruction and group discussion with PowerPoint presentation and patient guidebook. To enhance the learning environment the use of posters, models and videos may be added. At the conclusion of this workshop, patients will comprehend the difference between physical  activity and exercise, as well as the benefits of incorporating both, into their routine. Patients will understand the FITT (Frequency, Intensity, Time, and Type) principle and how to use it to build an exercise action plan. In addition, safety concerns and other considerations for exercise and cardiac rehab will be addressed by the presenter. The purpose of this lesson is to promote a comprehensive and effective weekly exercise routine in order to improve patients' overall level of fitness.   Managing Heart Disease: Your Path to a Healthier Heart Clinical staff led group instruction and group discussion with PowerPoint presentation and patient guidebook. To enhance the learning environment the use of posters, models and videos may be added.At the conclusion of this workshop, patients will understand the anatomy and physiology of the heart. Additionally, they will understand how Pritikin's three pillars impact the risk factors, the progression, and the management of heart disease.  The purpose of this lesson is to provide a high-level overview of the heart, heart disease, and how the Pritikin lifestyle positively impacts risk factors.  Exercise Biomechanics Clinical staff led group instruction and group discussion with PowerPoint presentation and patient guidebook. To enhance the learning environment the use of posters, models and videos may be added. Patients will learn how the structural parts of their bodies function and how these functions impact their daily activities, movement, and exercise. Patients will learn how to promote a neutral spine, learn how to manage pain, and identify ways to improve their physical movement in order to promote healthy living. The purpose of this lesson is to expose patients to common physical limitations that impact physical activity. Participants will learn practical ways to adapt and manage aches and pains, and to minimize their effect on regular exercise.  Patients will learn how to maintain good posture while sitting, walking, and lifting.  Balance Training and Fall Prevention  Clinical staff led group instruction and group discussion with PowerPoint presentation and patient guidebook. To enhance the learning  environment the use of posters, models and videos may be added. At the conclusion of this workshop, patients will understand the importance of their sensorimotor skills (vision, proprioception, and the vestibular system) in maintaining their ability to balance as they age. Patients will apply a variety of balancing exercises that are appropriate for their current level of function. Patients will understand the common causes for poor balance, possible solutions to these problems, and ways to modify their physical environment in order to minimize their fall risk. The purpose of this lesson is to teach patients about the importance of maintaining balance as they age and ways to minimize their risk of falling.  WORKSHOPS   Nutrition:  Fueling a Scientist, research (physical sciences) led group instruction and group discussion with PowerPoint presentation and patient guidebook. To enhance the learning environment the use of posters, models and videos may be added. Patients will review the foundational principles of the [place] and understand what constitutes a serving size in each of the food groups. Patients will also learn Pritikin-friendly foods that are better choices when away from home and review make-ahead meal and snack options. Calorie density will be reviewed and applied to three nutrition priorities: weight maintenance, weight loss, and weight gain. The purpose of this lesson is to reinforce (in a group setting) the key concepts around what patients are recommended to eat and how to apply these guidelines when away from home by planning and selecting Pritikin-friendly options. Patients will understand how calorie density may be adjusted for  different weight management goals.  Mindful Eating  Clinical staff led group instruction and group discussion with PowerPoint presentation and patient guidebook. To enhance the learning environment the use of posters, models and videos may be added. Patients will briefly review the concepts of the [place] and the importance of low-calorie dense foods. The concept of mindful eating will be introduced as well as the importance of paying attention to internal hunger signals. Triggers for non-hunger eating and techniques for dealing with triggers will be explored. The purpose of this lesson is to provide patients with the opportunity to review the basic principles of the [place], discuss the value of eating mindfully and how to measure internal cues of hunger and fullness using the Hunger Scale. Patients will also discuss reasons for non-hunger eating and learn strategies to use for controlling emotional eating.  Targeting Your Nutrition Priorities Clinical staff led group instruction and group discussion with PowerPoint presentation and patient guidebook. To enhance the learning environment the use of posters, models and videos may be added. Patients will learn how to determine their genetic susceptibility to disease by reviewing their family history. Patients will gain insight into the importance of diet as part of an overall healthy lifestyle in mitigating the impact of genetics and other environmental insults. The purpose of this lesson is to provide patients with the opportunity to assess their personal nutrition priorities by looking at their family history, their own health history and current risk factors. Patients will also be able to discuss ways of prioritizing and modifying the [employer] for their highest risk areas  Menu  Clinical staff led group instruction and group discussion with PowerPoint presentation and patient guidebook. To enhance the learning  environment the use of posters, models and videos may be added. Using menus brought in from [employer], or printed from [employer], patients will apply the [place] dining out guidelines that were presented in the [employer]  educational video. Patients will also be able to practice these guidelines in a variety of provided scenarios. The purpose of this lesson is to provide patients with the opportunity to practice hands-on learning of the [place] with actual menus and practice scenarios.  Label Reading Clinical staff led group instruction and group discussion with PowerPoint presentation and patient guidebook. To enhance the learning environment the use of posters, models and videos may be added. Patients will review and discuss the Pritikin label reading guidelines presented in Pritikin's Label Reading Educational series video. Using fool labels brought in from local grocery stores and markets, patients will apply the label reading guidelines and determine if the packaged food meet the Pritikin guidelines. The purpose of this lesson is to provide patients with the opportunity to review, discuss, and practice hands-on learning of the Pritikin Label Reading guidelines with actual packaged food labels. [place] Workshops are designed to teach patients ways to prepare quick, simple, and affordable recipes at home. The importance of nutrition's role in chronic disease risk reduction is reflected in its emphasis in the overall Pritikin program. By learning how to prepare essential core Pritikin Eating Plan recipes, patients will increase control over what they eat; be able to customize the flavor of foods without the use of added salt, sugar, or fat; and improve the quality of the food they consume. By learning a set of core recipes which are easily assembled, quickly prepared, and affordable, patients are more likely to prepare more healthy  foods at home. These workshops focus on convenient breakfasts, simple entres, side dishes, and desserts which can be prepared with minimal effort and are consistent with nutrition recommendations for cardiovascular risk reduction. Cooking [employer] are taught by a Engineer, materials (RD) who has been trained by the [employer]. The chef or RD has a clear understanding of the importance of minimizing - if not completely eliminating - added fat, sugar, and sodium in recipes. Throughout the series of [place] Workshop sessions, patients will learn about healthy ingredients and efficient methods of cooking to build confidence in their capability to prepare    Cooking School weekly topics:  Adding Flavor- Sodium-Free  Fast and Healthy Breakfasts  Powerhouse Plant-Based Proteins  Satisfying Salads and Dressings  Simple Sides and Sauces  International Cuisine-Spotlight on the [place] Zones  Delicious Desserts  Savory Soups  Efficiency Cooking - Meals in a Snap  Tasty Appetizers and Snacks  Comforting Weekend Breakfasts  One-Pot Wonders   Fast Evening Meals  Easy [place] (Psychosocial): New Thoughts, New Behaviors Clinical staff led group instruction and group discussion with PowerPoint presentation and patient guidebook. To enhance the learning environment the use of posters, models and videos may be added. Patients will learn and practice techniques for developing effective health and lifestyle goals. Patients will be able to effectively apply the goal setting process learned to develop at least one new personal goal.  The purpose of this lesson is to expose patients to a new skill set of behavior modification techniques such as techniques setting SMART goals, overcoming barriers, and achieving new thoughts and new behaviors.  Managing Moods and Relationships Clinical staff led group  instruction and group discussion with PowerPoint presentation and patient guidebook. To enhance the learning environment the use of posters, models and videos may be added. Patients will learn how emotional and chronic stress factors can impact  their health and relationships. They will learn healthy ways to manage their moods and utilize positive coping mechanisms. In addition, ICR patients will learn ways to improve communication skills. The purpose of this lesson is to expose patients to ways of understanding how one's mood and health are intimately connected. Developing a healthy outlook can help build positive relationships and connections with others. Patients will understand the importance of utilizing effective communication skills that include actively listening and being heard. They will learn and understand the importance of the "4 Cs" and especially Connections in fostering of a Healthy Mind-Set.  Healthy Sleep for a Healthy Heart Clinical staff led group instruction and group discussion with PowerPoint presentation and patient guidebook. To enhance the learning environment the use of posters, models and videos may be added. At the conclusion of this workshop, patients will be able to demonstrate knowledge of the importance of sleep to overall health, well-being, and quality of life. They will understand the symptoms of, and treatments for, common sleep disorders. Patients will also be able to identify daytime and nighttime behaviors which impact sleep, and they will be able to apply these tools to help manage sleep-related challenges. The purpose of this lesson is to provide patients with a general overview of sleep and outline the importance of quality sleep. Patients will learn about a few of the most common sleep disorders. Patients will also be introduced to the concept of "sleep hygiene," and discover ways to self-manage certain sleeping problems through simple daily behavior changes. Finally,  the workshop will motivate patients by clarifying the links between quality sleep and their goals of heart-healthy living.   Recognizing and Reducing Stress Clinical staff led group instruction and group discussion with PowerPoint presentation and patient guidebook. To enhance the learning environment the use of posters, models and videos may be added. At the conclusion of this workshop, patients will be able to understand the types of stress reactions, differentiate between acute and chronic stress, and recognize the impact that chronic stress has on their health. They will also be able to apply different coping mechanisms, such as reframing negative self-talk. Patients will have the opportunity to practice a variety of stress management techniques, such as deep abdominal breathing, progressive muscle relaxation, and/or guided imagery.  The purpose of this lesson is to educate patients on the role of stress in their lives and to provide healthy techniques for coping with it.  Learning Barriers/Preferences:  Learning Barriers/Preferences - 07/04/22 1418       Learning Barriers/Preferences   Learning Barriers Sight   wears reading glasses   Learning Preferences Audio;Group Instruction;Individual Instruction;Pictoral;Skilled Demonstration;Verbal Instruction;Video;Written Material             Education Topics:  Knowledge Questionnaire Score:  Knowledge Questionnaire Score - 07/04/22 1355       Knowledge Questionnaire Score   Pre Score 25/28             Core Components/Risk Factors/Patient Goals at Admission:  Personal Goals and Risk Factors at Admission - 07/04/22 1419       Core Components/Risk Factors/Patient Goals on Admission    Weight Management Yes    Intervention Weight Management: Develop a combined nutrition and exercise program designed to reach desired caloric intake, while maintaining appropriate intake of nutrient and fiber, sodium and fats, and appropriate energy  expenditure required for the weight goal.    Expected Outcomes Short Term: Continue to assess and modify interventions until short term weight is achieved;Long Term: Adherence to  nutrition and physical activity/exercise program aimed toward attainment of established weight goal    Heart Failure --    Hypertension Yes    Lipids Yes    Stress Yes             Core Components/Risk Factors/Patient Goals Review:    Core Components/Risk Factors/Patient Goals at Discharge (Final Review):    ITP Comments:  ITP Comments     Row Name 07/04/22 1021           ITP Comments Dr. Fransico Him medical director. Introduction to pritikin education/ intensive cardiac rehab. Initial orientation packet reviewed with patient.                Comments: Participant attended orientation for the cardiac rehabilitation program on  07/04/2022  to perform initial intake and exercise walk test. Patient introduced to the [place] education and orientation packet was reviewed. Completed 6-minute walk test, Zalyn reported having 5/10 chronic,back neck and hip pain during the walk test. Patient was given a rollator to help with balance.  measurements, initial ITP, and exercise prescription. Vital signs stable. Telemetry-Slightly irregular rhythm with an intermittent PVC's questionable run of wide complex tachycardia, asymptomatic.Harrell Gave RN BSN   Service time was from 1013 to 1230.

## 2022-07-09 ENCOUNTER — Encounter: Payer: Self-pay | Admitting: Thoracic Surgery (Cardiothoracic Vascular Surgery)

## 2022-07-09 ENCOUNTER — Ambulatory Visit (INDEPENDENT_AMBULATORY_CARE_PROVIDER_SITE_OTHER): Payer: Medicare Other | Admitting: Thoracic Surgery (Cardiothoracic Vascular Surgery)

## 2022-07-09 VITALS — Ht 72.0 in | Wt 212.0 lb

## 2022-07-09 DIAGNOSIS — R911 Solitary pulmonary nodule: Secondary | ICD-10-CM

## 2022-07-09 DIAGNOSIS — I2510 Atherosclerotic heart disease of native coronary artery without angina pectoris: Secondary | ICD-10-CM | POA: Diagnosis not present

## 2022-07-09 NOTE — Progress Notes (Signed)
Cardiac Individual Treatment Plan  Patient Details  Name: Brandon [name] MRN: [id number] Date of Birth: 1955-12-01 Referring Provider:   [place] from 07/04/2022 in [facility]  Referring Provider Dr. [name]       Initial Encounter Date:  Flowsheet Row INTENSIVE CARDIAC REHAB ORIENT from 07/04/2022 in [facility], & [place]  Date 07/04/22       Visit Diagnosis: 05/06/22 STEMI, Med [place], 04/22/22 VFIB  Patient's Home Medications on Admission:  Current Outpatient Medications:    acetaminophen (TYLENOL) 500 MG tablet, Take 500-1,000 mg by mouth every 6 (six) hours as needed (for pain)., Disp: , Rfl:    aspirin EC 81 MG tablet, Take 1 tablet (81 mg total) by mouth daily. Swallow whole., Disp: 90 tablet, Rfl: 1   atorvastatin (LIPITOR) 80 MG tablet, TAKE 1 TABLET BY MOUTH ONCE DAILY AT 6PM. (Patient taking differently: Take 80 mg by mouth at bedtime.), Disp: 90 tablet, Rfl: 3   Carboxymethylcellulose Sodium (ARTIFICIAL TEARS OP), Place 1 drop into both eyes daily as needed (for dryness or irritation)., Disp: , Rfl:    eplerenone (INSPRA) 50 MG tablet, Take 1 tablet (50 mg total) by mouth in the morning., Disp: 30 tablet, Rfl: 6   ezetimibe (ZETIA) 10 MG tablet, Take 1 tablet (10 mg total) by mouth daily., Disp: 90 tablet, Rfl: 3   gabapentin (NEURONTIN) 300 MG capsule, Take 1 capsule (300 mg total) by mouth 3 (three) times daily., Disp: 90 capsule, Rfl: 3   losartan (COZAAR) 25 MG tablet, Take 0.5 tablets (12.5 mg total) by mouth daily., Disp: 30 tablet, Rfl: 5   metoprolol succinate (TOPROL-XL) 25 MG 24 hr tablet, Take 0.5 tablets (12.5 mg total) by mouth daily., Disp: 45 tablet, Rfl: 1   pantoprazole (PROTONIX) 40 MG tablet, Take 1 tablet (40 mg total) by mouth 2 (two) times daily., Disp: 60 tablet, Rfl: 3   potassium chloride SA (KLOR-CON M) 20  MEQ tablet, Take 1 tablet (20 mEq total) by mouth daily., Disp: 90 tablet, Rfl: 3   sertraline (ZOLOFT) 50 MG tablet, TAKE 1/2 TAB BY MOUTH DAILY FOR 1 WEEK, THEN INCREASE TO 1 TABLET DAILY., Disp: 90 tablet, Rfl: 3   tamsulosin (FLOMAX) 0.4 MG CAPS capsule, Take 1 capsule (0.4 mg total) by mouth daily., Disp: 90 capsule, Rfl: 3   torsemide (DEMADEX) 20 MG tablet, Take 1 tablet (20 mg total) by mouth as needed., Disp: 30 tablet, Rfl: 5   traMADol (ULTRAM) 50 MG tablet, TAKE 1-2 TABLETS BY MOUTH TWICE A DAY FOR 30 DAYS, Disp: 120 tablet, Rfl: 1   warfarin (COUMADIN) 2.5 MG tablet, Take 7.5 mg (3 tablets) by mouth daily or as instructed by [facility] clinic. (Patient taking differently: Take 2.5 mg by mouth daily at 4 PM. Take 5 mg (2 tablets) on Monday, and 7.5 mg (3 tablets) by mouth daily all other days, or as instructed by [facility] clinic.), Disp: 270 tablet, Rfl: 1   ZYRTEC ALLERGY 10 MG tablet, Take 10 mg by mouth daily., Disp: , Rfl:    melatonin 5 MG TABS, Take 1 tablet (5 mg total) by mouth at bedtime., Disp: 30 tablet, Rfl: 6   ondansetron (ZOFRAN) 4 MG tablet, Take 1 tablet (4 mg total) by mouth every 8 (eight) hours as needed for nausea or vomiting., Disp: 20 tablet, Rfl: 3  Past Medical History: Past Medical History:  Diagnosis Date   AICD (automatic cardioverter/defibrillator) present 08/31/2019   Ankylosing spondylitis ([place])    Arthritis    BENIGN PROSTATIC HYPERTROPHY 06/07/2008   CHF (congestive heart failure) ([place])    COLONIC POLYPS, HX OF 06/07/2008   Coronary artery disease    Depression    H/O hiatal hernia    Heart failure ([place])    HYPERLIPIDEMIA 06/07/2008   HYPERTENSION 06/07/2008   Myocardial infarction [facility]) 2005   NSTEMI, s/p LAD stent   NEPHROLITHIASIS, HX OF 06/07/2008   STEMI (ST elevation myocardial infarction) ([place]) 04/27/2019    Tobacco Use: Social History   Tobacco Use  Smoking Status Former   Packs/day: 1.00   Years: 40.00   Total pack years: 40.00    Types: Cigarettes   Quit date: 07/26/2019   Years since quitting: 2.9  Smokeless Tobacco Never  Tobacco Comments   1-1.5 pks per year x 40-45 yrs    Labs: Review Flowsheet  More data exists      Latest Ref Rng & Units 04/28/2021 05/04/2021 05/06/2022 05/07/2022 05/08/2022  Labs for ITP Cardiac and Pulmonary Rehab  PH, Arterial 7.35 - 7.45 - - 7.371  7.378  - -  PCO2 arterial 32 - 48 mmHg - - 31.4  36.5  - -  Bicarbonate 20.0 - 28.0 mmol/L - - 24.8  18.2  21.5  - -  TCO2 22 - 32 mmol/L - - '26  19  23  '$ - -  Acid-base deficit 0.0 - 2.0 mmol/L - - 1.0  6.0  3.0  - -  O2 Saturation % 36.8  51.2  60  92  91  71.7  75.6     Capillary Blood Glucose: Lab Results  Component Value Date   GLUCAP 117 (H) 06/12/2022   GLUCAP 122 (H) 05/06/2022   GLUCAP 100 (H) 05/09/2021   GLUCAP 84 05/09/2021   GLUCAP 122 (H) 05/08/2021     Exercise Target Goals: Exercise Program Goal: Individual exercise prescription set using results from initial 6 min walk test and THRR while considering  patient's activity barriers and safety.   Exercise Prescription Goal: Initial exercise prescription builds to 30-45 minutes a day of aerobic activity, 2-3 days per week.  Home exercise guidelines will be given to patient during program as part of exercise prescription that the participant will acknowledge.  Activity Barriers & Risk Stratification:  Activity Barriers & Cardiac Risk Stratification - 07/04/22 1332       Activity Barriers & Cardiac Risk Stratification   Activity Barriers Joint Problems;Deconditioning;Arthritis;Back Problems;Shortness of Breath;Neck/Spine Problems;Balance Concerns    Cardiac Risk Stratification High             6 Minute Walk:  6 Minute Walk     Row Name 07/04/22 1323         6 Minute Walk   Phase Initial     Distance 1040 feet     Walk Time 6 minutes     # of Rest Breaks 0     MPH 1.97     METS 2.38     RPE 12     Perceived Dyspnea  1     VO2 Peak 8.32      Symptoms Yes (comment)     Comments bilateral hip pain 5/10 chronic. 5/10 back neck bain chronic. Relieved with rest. Rollator used midway through walk test due to unsteadiness     Resting HR 96 bpm     Resting BP 84/0  VAD-MAP  Resting Oxygen Saturation  97 %     Exercise Oxygen Saturation  during 6 min walk 95 %     Max Ex. HR 125 bpm     Max Ex. BP 86/0  VAD-MAP     2 Minute Post BP 80/0  VAD-MAP              Oxygen Initial Assessment:   Oxygen Re-Evaluation:   Oxygen Discharge (Final Oxygen Re-Evaluation):   Initial Exercise Prescription:  Initial Exercise Prescription - 07/04/22 1300       Date of Initial Exercise RX and Referring Provider   Date 07/04/22    Referring Provider Dr. [name]    Expected Discharge Date 09/13/22      Recumbant Bike   Level 1    RPM 65    Minutes 15    METs 1.8      NuStep   Level 1    SPM 85    Minutes 15    METs 1.8      Prescription Details   Frequency (times per week) 3    Duration Progress to 30 minutes of continuous aerobic without signs/symptoms of physical distress      Intensity   THRR 40-80% of Max Heartrate 61-122    Ratings of Perceived Exertion 11-13    Perceived Dyspnea 0-4      Progression   Progression Continue progressive overload as per policy without signs/symptoms or physical distress.      Resistance Training   Training Prescription Yes    Weight 3 lbs    Reps 10-15             Perform Capillary Blood Glucose checks as needed.  Exercise Prescription Changes:   Exercise Prescription Changes     Row Name 07/04/22 1300             Response to Exercise   Blood Pressure (Admit) 84/0                Exercise Comments:   Exercise Goals and Review:   Exercise Goals     Row Name 07/04/22 1347             Exercise Goals   Increase Physical Activity Yes       Intervention Provide advice, education, support and counseling about physical activity/exercise  needs.;Develop an individualized exercise prescription for aerobic and resistive training based on initial evaluation findings, risk stratification, comorbidities and participant's personal goals.       Expected Outcomes Short Term: Attend rehab on a regular basis to increase amount of physical activity.;Long Term: Add in home exercise to make exercise part of routine and to increase amount of physical activity.;Long Term: Exercising regularly at least 3-5 days a week.       Able to understand and use rate of perceived exertion (RPE) scale Yes       Intervention Provide education and explanation on how to use RPE scale       Expected Outcomes Short Term: Able to use RPE daily in rehab to express subjective intensity level;Long Term:  Able to use RPE to guide intensity level when exercising independently       Knowledge and understanding of Target Heart Rate Range (THRR) Yes       Intervention Provide education and explanation of THRR including how the numbers were predicted and where they are located for reference       Expected Outcomes Short Term: Able to state/look up THRR;Long  Term: Able to use THRR to govern intensity when exercising independently;Short Term: Able to use daily as guideline for intensity in rehab       Understanding of Exercise Prescription Yes       Intervention Provide education, explanation, and written materials on patient's individual exercise prescription       Expected Outcomes Short Term: Able to explain program exercise prescription;Long Term: Able to explain home exercise prescription to exercise independently                Exercise Goals Re-Evaluation :   Discharge Exercise Prescription (Final Exercise Prescription Changes):  Exercise Prescription Changes - 07/04/22 1300       Response to Exercise   Blood Pressure (Admit) 84/0             Nutrition:  Target Goals: Understanding of nutrition guidelines, daily intake of sodium '1500mg'$ , cholesterol  '200mg'$ , calories 30% from fat and 7% or less from saturated fats, daily to have 5 or more servings of fruits and vegetables.  Biometrics:  Pre Biometrics - 07/04/22 1319       Pre Biometrics   Height 6' (1.829 m)   Will finish Pre-biometrics on patients 1st day of exercise             Nutrition Therapy Plan and Nutrition Goals:   Nutrition Assessments:  Nutrition Assessments - 07/04/22 1600       Rate Your Plate Scores   Pre Score 51            MEDIFICTS Score Key: ?70 Need to make dietary changes  40-70 Heart Healthy Diet ? 40 Therapeutic Level Cholesterol Diet   Flowsheet Row INTENSIVE CARDIAC REHAB ORIENT from 07/04/2022 in [facility], V[facility], & Lung [facility]  Picture Your Plate Total Score on Admission 51      Picture Your Plate Scores: [id number] Unhealthy dietary pattern with much room for improvement. 41-50 Dietary pattern unlikely to meet recommendations for good health and room for improvement. 51-60 More healthful dietary pattern, with some room for improvement.  >60 Healthy dietary pattern, although there may be some specific behaviors that could be improved.    Nutrition Goals Re-Evaluation:   Nutrition Goals Re-Evaluation:   Nutrition Goals Discharge (Final Nutrition Goals Re-Evaluation):   Psychosocial: Target Goals: Acknowledge presence or absence of significant depression and/or stress, maximize coping skills, provide positive support system. Participant is able to verbalize types and ability to use techniques and skills needed for reducing stress and depression.  Initial Review & Psychosocial Screening:  Initial Psych Review & Screening - 07/04/22 1348       Initial Review   Current issues with Current Stress Concerns;History of Depression;Current Anxiety/Panic    Source of Stress Concerns Chronic Illness    Comments Brandon [name] has a history of depression currently taking Zoloft. Brandon [name] says he is grinding his  teeth at night. Wants to discuss weaning off Zoloft. Brandon [name] admits to experiencing some anxiety since his two hospitaliztoins in December.      Family Dynamics   Good Support System? Yes   Brandon [name] lives alone he has a sister who lives in the area for support     Barriers   Psychosocial barriers to participate in program The patient should benefit from training in stress management and relaxation.      Screening Interventions   Interventions Encouraged to exercise    Expected Outcomes Long Term Goal: Stressors or current issues are controlled or eliminated.;Short Term  goal: Identification and review with participant of any Quality of Life or Depression concerns found by scoring the questionnaire.             Quality of Life Scores:  Quality of Life - 07/04/22 1349       Quality of Life   Select Quality of Life      Quality of Life Scores   Health/Function Pre 18.7 %    Socioeconomic Pre 20.86 %    Psych/Spiritual Pre 16.57 %    Family Pre 17 %    GLOBAL Pre 18.5 %            Scores of 19 and below usually indicate a poorer quality of life in these areas.  A difference of  2-3 points is a clinically meaningful difference.  A difference of 2-3 points in the total score of the Quality of Life Index has been associated with significant improvement in overall quality of life, self-image, physical symptoms, and general health in studies assessing change in quality of life.  PHQ-9: Review Flowsheet  More data exists      07/04/2022 02/22/2022 11/02/2021 09/24/2021 09/12/2021  Depression screen PHQ 2/9  Decreased Interest 0 0 0 0 0  Down, Depressed, Hopeless 0 0 0 0 0  PHQ - 2 Score 0 0 0 0 0  Altered sleeping 1 0 1 - 1  Tired, decreased energy 0 0 1 - 1  Change in appetite 0 0 0 - 0  Feeling bad or failure about yourself  0 0 0 - 0  Trouble concentrating 0 0 0 - 1  Moving slowly or fidgety/restless 0 0 0 - 0  Suicidal thoughts 0 0 0 - 0  PHQ-9 Score 1 0 2 - 3  Difficult doing  work/chores Not difficult at all - Not difficult at all - Not difficult at all   Interpretation of Total Score  Total Score Depression Severity:  1-4 = Minimal depression, 5-9 = Mild depression, 10-14 = Moderate depression, 15-19 = Moderately severe depression, 20-27 = Severe depression   Psychosocial Evaluation and Intervention:   Psychosocial Re-Evaluation:   Psychosocial Discharge (Final Psychosocial Re-Evaluation):   Vocational Rehabilitation: Provide vocational rehab assistance to qualifying candidates.   Vocational Rehab Evaluation & Intervention:  Vocational Rehab - 07/04/22 1353       Initial Vocational Rehab Evaluation & Intervention   Assessment shows need for Vocational Rehabilitation No   Brandon [name] is retired and does not need vocational rehab at this time            Education: Education Goals: Education classes will be provided on a weekly basis, covering required topics. Participant will state understanding/return demonstration of topics presented.     Core Videos: Exercise    Move It!  Clinical staff conducted group or individual video education with verbal and written material and guidebook.  Patient learns the recommended Pritikin exercise program. Exercise with the goal of living a long, healthy life. Some of the health benefits of exercise include controlled diabetes, healthier blood pressure levels, improved cholesterol levels, improved heart and lung capacity, improved sleep, and better body composition. Everyone should speak with their doctor before starting or changing an exercise routine.  Biomechanical Limitations Clinical staff conducted group or individual video education with verbal and written material and guidebook.  Patient learns how biomechanical limitations can impact exercise and how we can mitigate and possibly overcome limitations to have an impactful and balanced exercise routine.  Body Composition Clinical staff  conducted group or  individual video education with verbal and written material and guidebook.  Patient learns that body composition (ratio of muscle mass to fat mass) is a key component to assessing overall fitness, rather than body weight alone. Increased fat mass, especially visceral belly fat, can put [place] at increased risk for metabolic syndrome, type 2 diabetes, heart disease, and even death. It is recommended to combine diet and exercise (cardiovascular and resistance training) to improve your body composition. Seek guidance from your physician and exercise physiologist before implementing an exercise routine.  Exercise Action Plan Clinical staff conducted group or individual video education with verbal and written material and guidebook.  Patient learns the recommended strategies to achieve and enjoy long-term exercise adherence, including variety, self-motivation, self-efficacy, and positive decision making. Benefits of exercise include fitness, good health, weight management, more energy, better sleep, less stress, and overall well-being.  Medical   Heart Disease Risk Reduction Clinical staff conducted group or individual video education with verbal and written material and guidebook.  Patient learns our heart is our most vital organ as it circulates oxygen, nutrients, white blood cells, and hormones throughout the entire body, and carries waste away. Data supports a plant-based eating plan like the Pritikin Program for its effectiveness in slowing progression of and reversing heart disease. The video provides a number of recommendations to address heart disease.   Metabolic Syndrome and Belly Fat  Clinical staff conducted group or individual video education with verbal and written material and guidebook.  Patient learns what metabolic syndrome is, how it leads to heart disease, and how one can reverse it and keep it from coming back. You have metabolic syndrome if you have 3 of the following 5 criteria: abdominal  obesity, high blood pressure, high triglycerides, low HDL cholesterol, and high blood sugar.  Hypertension and Heart Disease Clinical staff conducted group or individual video education with verbal and written material and guidebook.  Patient learns that high blood pressure, or hypertension, is very common in the [place]. Hypertension is largely due to excessive salt intake, but other important risk factors include being overweight, physical inactivity, drinking too much alcohol, smoking, and not eating enough potassium from fruits and vegetables. High blood pressure is a leading risk factor for heart attack, stroke, congestive heart failure, dementia, kidney failure, and premature death. Long-term effects of excessive salt intake include stiffening of the arteries and thickening of heart muscle and organ damage. Recommendations include ways to reduce hypertension and the risk of heart disease.  Diseases of Our Time - Focusing on Diabetes Clinical staff conducted group or individual video education with verbal and written material and guidebook.  Patient learns why the best way to stop diseases of our time is prevention, through food and other lifestyle changes. Medicine (such as prescription pills and surgeries) is often only a Band-Aid on the problem, not a long-term solution. Most common diseases of our time include obesity, type 2 diabetes, hypertension, heart disease, and cancer. The Pritikin Program is recommended and has been proven to help reduce, reverse, and/or prevent the damaging effects of metabolic syndrome.  Nutrition   Overview of the Pritikin Eating Plan  Clinical staff conducted group or individual video education with verbal and written material and guidebook.  Patient learns about the [place] for disease risk reduction. The [place] emphasizes a wide variety of unrefined, minimally-processed carbohydrates, like fruits, vegetables, whole grains, and  legumes. Go, Caution, and Stop food choices are explained. Plant-based and  lean animal proteins are emphasized. Rationale provided for low sodium intake for blood pressure control, low added sugars for blood sugar stabilization, and low added fats and oils for coronary artery disease risk reduction and weight management.  Calorie Density  Clinical staff conducted group or individual video education with verbal and written material and guidebook.  Patient learns about calorie density and how it impacts the Pritikin Eating Plan. Knowing the characteristics of the food you choose will help you decide whether those foods will lead to weight gain or weight loss, and whether you want to consume more or less of them. Weight loss is usually a side effect of the Pritikin Eating Plan because of its focus on low calorie-dense foods.  Label Reading  Clinical staff conducted group or individual video education with verbal and written material and guidebook.  Patient learns about the Pritikin recommended label reading guidelines and corresponding recommendations regarding calorie density, added sugars, sodium content, and whole grains.  Dining Out - Part 1  Clinical staff conducted group or individual video education with verbal and written material and guidebook.  Patient learns that restaurant meals can be sabotaging because they can be so high in calories, fat, sodium, and/or sugar. Patient learns recommended strategies on how to positively address this and avoid unhealthy pitfalls.  Facts on Fats  Clinical staff conducted group or individual video education with verbal and written material and guidebook.  Patient learns that lifestyle modifications can be just as effective, if not more so, as many medications for lowering your risk of heart disease. A Pritikin lifestyle can help to reduce your risk of inflammation and atherosclerosis (cholesterol build-up, or plaque, in the artery walls). Lifestyle  interventions such as dietary choices and physical activity address the cause of atherosclerosis. A review of the types of fats and their impact on blood cholesterol levels, along with dietary recommendations to reduce fat intake is also included.  Nutrition Action Plan  Clinical staff conducted group or individual video education with verbal and written material and guidebook.  Patient learns how to incorporate Pritikin recommendations into their lifestyle. Recommendations include planning and keeping personal health goals in mind as an important part of their success.  Healthy Mind-Set    Healthy Minds, Bodies, Hearts  Clinical staff conducted group or individual video education with verbal and written material and guidebook.  Patient learns how to identify when they are stressed. Video will discuss the impact of that stress, as well as the many benefits of stress management. Patient will also be introduced to stress management techniques. The way we think, act, and feel has an impact on our hearts.  How Our Thoughts Can Heal Our Hearts  Clinical staff conducted group or individual video education with verbal and written material and guidebook.  Patient learns that negative thoughts can cause depression and anxiety. This can result in negative lifestyle behavior and serious health problems. Cognitive behavioral therapy is an effective method to help control our thoughts in order to change and improve our emotional outlook.  Additional Videos:  Exercise    Improving Performance  Clinical staff conducted group or individual video education with verbal and written material and guidebook.  Patient learns to use a non-linear approach by alternating intensity levels and lengths of time spent exercising to help burn more calories and lose more body fat. Cardiovascular exercise helps improve heart health, metabolism, hormonal balance, blood sugar control, and recovery from fatigue. Resistance training  improves strength, endurance, balance, coordination, reaction time, metabolism,  and muscle mass. Flexibility exercise improves circulation, posture, and balance. Seek guidance from your physician and exercise physiologist before implementing an exercise routine and learn your capabilities and proper form for all exercise.  Introduction to Yoga  Clinical staff conducted group or individual video education with verbal and written material and guidebook.  Patient learns about yoga, a discipline of the coming together of mind, breath, and body. The benefits of yoga include improved flexibility, improved range of motion, better posture and core strength, increased lung function, weight loss, and positive self-image. Yoga's heart health benefits include lowered blood pressure, healthier heart rate, decreased cholesterol and triglyceride levels, improved immune function, and reduced stress. Seek guidance from your physician and exercise physiologist before implementing an exercise routine and learn your capabilities and proper form for all exercise.  Medical   Aging: Enhancing Your Quality of Life  Clinical staff conducted group or individual video education with verbal and written material and guidebook.  Patient learns key strategies and recommendations to stay in good physical health and enhance quality of life, such as prevention strategies, having an advocate, securing a [place], and keeping a list of medications and system for tracking them. It also discusses how to avoid risk for bone loss.  Biology of Weight Control  Clinical staff conducted group or individual video education with verbal and written material and guidebook.  Patient learns that weight gain occurs because we consume more calories than we burn (eating more, moving less). Even if your body weight is normal, you may have higher ratios of fat compared to muscle mass. Too much body fat puts you at increased  risk for cardiovascular disease, heart attack, stroke, type 2 diabetes, and obesity-related cancers. In addition to exercise, following the [place] can help reduce your risk.  Decoding Lab Results  Clinical staff conducted group or individual video education with verbal and written material and guidebook.  Patient learns that lab test reflects one measurement whose values change over time and are influenced by many factors, including medication, stress, sleep, exercise, food, hydration, pre-existing medical conditions, and more. It is recommended to use the knowledge from this video to become more involved with your lab results and evaluate your numbers to speak with your doctor.   Diseases of Our Time - Overview  Clinical staff conducted group or individual video education with verbal and written material and guidebook.  Patient learns that according to the CDC, 50% to 70% of chronic diseases (such as obesity, type 2 diabetes, elevated lipids, hypertension, and heart disease) are avoidable through lifestyle improvements including healthier food choices, listening to satiety cues, and increased physical activity.  Sleep Disorders Clinical staff conducted group or individual video education with verbal and written material and guidebook.  Patient learns how good quality and duration of sleep are important to overall health and well-being. Patient also learns about sleep disorders and how they impact health along with recommendations to address them, including discussing with a physician.  Nutrition  Dining Out - Part 2 Clinical staff conducted group or individual video education with verbal and written material and guidebook.  Patient learns how to plan ahead and communicate in order to maximize their dining experience in a healthy and nutritious manner. Included are recommended food choices based on the type of restaurant the patient is visiting.   Fueling a Development worker, international aid conducted group or individual video education with verbal and written material and guidebook.  There is a strong connection between our food choices and our health. Diseases like obesity and type 2 diabetes are very prevalent and are in large-part due to lifestyle choices. The Pritikin Eating Plan provides plenty of food and hunger-curbing satisfaction. It is easy to follow, affordable, and helps reduce health risks.  Menu Workshop  Clinical staff conducted group or individual video education with verbal and written material and guidebook.  Patient learns that restaurant meals can sabotage health goals because they are often packed with calories, fat, sodium, and sugar. Recommendations include strategies to plan ahead and to communicate with the manager, chef, or server to help order a healthier meal.  Planning Your Eating Strategy  Clinical staff conducted group or individual video education with verbal and written material and guidebook.  Patient learns about the [place] and its benefit of reducing the risk of disease. The [place] does not focus on calories. Instead, it emphasizes high-quality, nutrient-rich foods. By knowing the characteristics of the foods, we choose, we can determine their calorie density and make informed decisions.  Targeting Your Nutrition Priorities  Clinical staff conducted group or individual video education with verbal and written material and guidebook.  Patient learns that lifestyle habits have a tremendous impact on disease risk and progression. This video provides eating and physical activity recommendations based on your personal health goals, such as reducing LDL cholesterol, losing weight, preventing or controlling type 2 diabetes, and reducing high blood pressure.  Vitamins and Minerals  Clinical staff conducted group or individual video education with verbal and written material and guidebook.  Patient learns different ways to  obtain key vitamins and minerals, including through a recommended healthy diet. It is important to discuss all supplements you take with your doctor.   Healthy Mind-Set    Smoking Cessation  Clinical staff conducted group or individual video education with verbal and written material and guidebook.  Patient learns that cigarette smoking and tobacco addiction pose a serious health risk which affects millions of people. Stopping smoking will significantly reduce the risk of heart disease, lung disease, and many forms of cancer. Recommended strategies for quitting are covered, including working with your doctor to develop a successful plan.  Culinary   Becoming a Financial trader conducted group or individual video education with verbal and written material and guidebook.  Patient learns that cooking at home can be healthy, cost-effective, quick, and puts them in control. Keys to cooking healthy recipes will include looking at your recipe, assessing your equipment needs, planning ahead, making it simple, choosing cost-effective seasonal ingredients, and limiting the use of added fats, salts, and sugars.  Cooking - Breakfast and Snacks  Clinical staff conducted group or individual video education with verbal and written material and guidebook.  Patient learns how important breakfast is to satiety and nutrition through the entire day. Recommendations include key foods to eat during breakfast to help stabilize blood sugar levels and to prevent overeating at meals later in the day. Planning ahead is also a key component.  Cooking - Human resources officer conducted group or individual video education with verbal and written material and guidebook.  Patient learns eating strategies to improve overall health, including an approach to cook more at home. Recommendations include thinking of animal protein as a side on your plate rather than center stage and focusing instead on lower  calorie dense options like vegetables, fruits, whole grains, and plant-based proteins, such as beans. Making sauces  in large quantities to freeze for later and leaving the skin on your vegetables are also recommended to maximize your experience.  Cooking - Healthy Salads and Dressing Clinical staff conducted group or individual video education with verbal and written material and guidebook.  Patient learns that vegetables, fruits, whole grains, and legumes are the foundations of the [place]. Recommendations include how to incorporate each of these in flavorful and healthy salads, and how to create homemade salad dressings. Proper handling of ingredients is also covered. Cooking - Soups and [employer] - Soups and Desserts Clinical staff conducted group or individual video education with verbal and written material and guidebook.  Patient learns that Pritikin soups and desserts make for easy, nutritious, and delicious snacks and meal components that are low in sodium, fat, sugar, and calorie density, while high in vitamins, minerals, and filling fiber. Recommendations include simple and healthy ideas for soups and desserts.   Overview     The Pritikin Solution Program Overview Clinical staff conducted group or individual video education with verbal and written material and guidebook.  Patient learns that the results of the [place] Program have been documented in more than 100 articles published in peer-reviewed journals, and the benefits include reducing risk factors for (and, in some cases, even reversing) high cholesterol, high blood pressure, type 2 diabetes, obesity, and more! An overview of the three key pillars of the Pritikin Program will be covered: eating well, doing regular exercise, and having a healthy mind-set.  WORKSHOPS  Exercise: Exercise Basics: Building Your Action Plan Clinical staff led group instruction and group discussion with PowerPoint presentation and  patient guidebook. To enhance the learning environment the use of posters, models and videos may be added. At the conclusion of this workshop, patients will comprehend the difference between physical activity and exercise, as well as the benefits of incorporating both, into their routine. Patients will understand the FITT (Frequency, Intensity, Time, and Type) principle and how to use it to build an exercise action plan. In addition, safety concerns and other considerations for exercise and cardiac rehab will be addressed by the presenter. The purpose of this lesson is to promote a comprehensive and effective weekly exercise routine in order to improve patients' overall level of fitness.   Managing Heart Disease: Your Path to a Healthier Heart Clinical staff led group instruction and group discussion with PowerPoint presentation and patient guidebook. To enhance the learning environment the use of posters, models and videos may be added.At the conclusion of this workshop, patients will understand the anatomy and physiology of the heart. Additionally, they will understand how Pritikin's three pillars impact the risk factors, the progression, and the management of heart disease.  The purpose of this lesson is to provide a high-level overview of the heart, heart disease, and how the Pritikin lifestyle positively impacts risk factors.  Exercise Biomechanics Clinical staff led group instruction and group discussion with PowerPoint presentation and patient guidebook. To enhance the learning environment the use of posters, models and videos may be added. Patients will learn how the structural parts of their bodies function and how these functions impact their daily activities, movement, and exercise. Patients will learn how to promote a neutral spine, learn how to manage pain, and identify ways to improve their physical movement in order to promote healthy living. The purpose of this lesson is to expose  patients to common physical limitations that impact physical activity. Participants will learn practical ways to adapt and manage aches  and pains, and to minimize their effect on regular exercise. Patients will learn how to maintain good posture while sitting, walking, and lifting.  Balance Training and Fall Prevention  Clinical staff led group instruction and group discussion with PowerPoint presentation and patient guidebook. To enhance the learning environment the use of posters, models and videos may be added. At the conclusion of this workshop, patients will understand the importance of their sensorimotor skills (vision, proprioception, and the vestibular system) in maintaining their ability to balance as they age. Patients will apply a variety of balancing exercises that are appropriate for their current level of function. Patients will understand the common causes for poor balance, possible solutions to these problems, and ways to modify their physical environment in order to minimize their fall risk. The purpose of this lesson is to teach patients about the importance of maintaining balance as they age and ways to minimize their risk of falling.  WORKSHOPS   Nutrition:  Fueling a Scientist, research (physical sciences) led group instruction and group discussion with PowerPoint presentation and patient guidebook. To enhance the learning environment the use of posters, models and videos may be added. Patients will review the foundational principles of the [place] and understand what constitutes a serving size in each of the food groups. Patients will also learn Pritikin-friendly foods that are better choices when away from home and review make-ahead meal and snack options. Calorie density will be reviewed and applied to three nutrition priorities: weight maintenance, weight loss, and weight gain. The purpose of this lesson is to reinforce (in a group setting) the key concepts around what  patients are recommended to eat and how to apply these guidelines when away from home by planning and selecting Pritikin-friendly options. Patients will understand how calorie density may be adjusted for different weight management goals.  Mindful Eating  Clinical staff led group instruction and group discussion with PowerPoint presentation and patient guidebook. To enhance the learning environment the use of posters, models and videos may be added. Patients will briefly review the concepts of the [place] and the importance of low-calorie dense foods. The concept of mindful eating will be introduced as well as the importance of paying attention to internal hunger signals. Triggers for non-hunger eating and techniques for dealing with triggers will be explored. The purpose of this lesson is to provide patients with the opportunity to review the basic principles of the [place], discuss the value of eating mindfully and how to measure internal cues of hunger and fullness using the Hunger Scale. Patients will also discuss reasons for non-hunger eating and learn strategies to use for controlling emotional eating.  Targeting Your Nutrition Priorities Clinical staff led group instruction and group discussion with PowerPoint presentation and patient guidebook. To enhance the learning environment the use of posters, models and videos may be added. Patients will learn how to determine their genetic susceptibility to disease by reviewing their family history. Patients will gain insight into the importance of diet as part of an overall healthy lifestyle in mitigating the impact of genetics and other environmental insults. The purpose of this lesson is to provide patients with the opportunity to assess their personal nutrition priorities by looking at their family history, their own health history and current risk factors. Patients will also be able to discuss ways of prioritizing and modifying  the [place] for their highest risk areas  Menu  Clinical staff led group instruction and group discussion with  PowerPoint presentation and patient guidebook. To enhance the learning environment the use of posters, models and videos may be added. Using menus brought in from [employer], or printed from [employer], patients will apply the [place] dining out guidelines that were presented in the [employer] video. Patients will also be able to practice these guidelines in a variety of provided scenarios. The purpose of this lesson is to provide patients with the opportunity to practice hands-on learning of the [place] with actual menus and practice scenarios.  Label Reading Clinical staff led group instruction and group discussion with PowerPoint presentation and patient guidebook. To enhance the learning environment the use of posters, models and videos may be added. Patients will review and discuss the Pritikin label reading guidelines presented in Pritikin's Label Reading Educational series video. Using fool labels brought in from local grocery stores and markets, patients will apply the label reading guidelines and determine if the packaged food meet the Pritikin guidelines. The purpose of this lesson is to provide patients with the opportunity to review, discuss, and practice hands-on learning of the Pritikin Label Reading guidelines with actual packaged food labels. [place] Workshops are designed to teach patients ways to prepare quick, simple, and affordable recipes at home. The importance of nutrition's role in chronic disease risk reduction is reflected in its emphasis in the overall Pritikin program. By learning how to prepare essential core Pritikin Eating Plan recipes, patients will increase control over what they eat; be able to customize the flavor of foods without the use of added salt, sugar, or  fat; and improve the quality of the food they consume. By learning a set of core recipes which are easily assembled, quickly prepared, and affordable, patients are more likely to prepare more healthy foods at home. These workshops focus on convenient breakfasts, simple entres, side dishes, and desserts which can be prepared with minimal effort and are consistent with nutrition recommendations for cardiovascular risk reduction. Cooking [employer] are taught by a Engineer, materials (RD) who has been trained by the [employer]. The chef or RD has a clear understanding of the importance of minimizing - if not completely eliminating - added fat, sugar, and sodium in recipes. Throughout the series of [place] Workshop sessions, patients will learn about healthy ingredients and efficient methods of cooking to build confidence in their capability to prepare    Cooking School weekly topics:  Adding Flavor- Sodium-Free  Fast and Healthy Breakfasts  Powerhouse Plant-Based Proteins  Satisfying Salads and Dressings  Simple Sides and Sauces  International Cuisine-Spotlight on the [place] Zones  Delicious Desserts  Savory Soups  Efficiency Cooking - Meals in a Snap  Tasty Appetizers and Snacks  Comforting Weekend Breakfasts  One-Pot Wonders   Fast Evening Meals  Easy [place] (Psychosocial): New Thoughts, New Behaviors Clinical staff led group instruction and group discussion with PowerPoint presentation and patient guidebook. To enhance the learning environment the use of posters, models and videos may be added. Patients will learn and practice techniques for developing effective health and lifestyle goals. Patients will be able to effectively apply the goal setting process learned to develop at least one new personal goal.  The purpose of this lesson is to expose patients to a new skill set of behavior  modification techniques such as techniques setting SMART goals, overcoming barriers, and achieving new thoughts  and new behaviors.  Managing Moods and Relationships Clinical staff led group instruction and group discussion with PowerPoint presentation and patient guidebook. To enhance the learning environment the use of posters, models and videos may be added. Patients will learn how emotional and chronic stress factors can impact their health and relationships. They will learn healthy ways to manage their moods and utilize positive coping mechanisms. In addition, ICR patients will learn ways to improve communication skills. The purpose of this lesson is to expose patients to ways of understanding how one's mood and health are intimately connected. Developing a healthy outlook can help build positive relationships and connections with others. Patients will understand the importance of utilizing effective communication skills that include actively listening and being heard. They will learn and understand the importance of the "4 Cs" and especially Connections in fostering of a Healthy Mind-Set.  Healthy Sleep for a Healthy Heart Clinical staff led group instruction and group discussion with PowerPoint presentation and patient guidebook. To enhance the learning environment the use of posters, models and videos may be added. At the conclusion of this workshop, patients will be able to demonstrate knowledge of the importance of sleep to overall health, well-being, and quality of life. They will understand the symptoms of, and treatments for, common sleep disorders. Patients will also be able to identify daytime and nighttime behaviors which impact sleep, and they will be able to apply these tools to help manage sleep-related challenges. The purpose of this lesson is to provide patients with a general overview of sleep and outline the importance of quality sleep. Patients will learn about a few of the most common  sleep disorders. Patients will also be introduced to the concept of "sleep hygiene," and discover ways to self-manage certain sleeping problems through simple daily behavior changes. Finally, the workshop will motivate patients by clarifying the links between quality sleep and their goals of heart-healthy living.   Recognizing and Reducing Stress Clinical staff led group instruction and group discussion with PowerPoint presentation and patient guidebook. To enhance the learning environment the use of posters, models and videos may be added. At the conclusion of this workshop, patients will be able to understand the types of stress reactions, differentiate between acute and chronic stress, and recognize the impact that chronic stress has on their health. They will also be able to apply different coping mechanisms, such as reframing negative self-talk. Patients will have the opportunity to practice a variety of stress management techniques, such as deep abdominal breathing, progressive muscle relaxation, and/or guided imagery.  The purpose of this lesson is to educate patients on the role of stress in their lives and to provide healthy techniques for coping with it.  Learning Barriers/Preferences:  Learning Barriers/Preferences - 07/04/22 1418       Learning Barriers/Preferences   Learning Barriers Sight   wears reading glasses   Learning Preferences Audio;Group Instruction;Individual Instruction;Pictoral;Skilled Demonstration;Verbal Instruction;Video;Written Material             Education Topics:  Knowledge Questionnaire Score:  Knowledge Questionnaire Score - 07/04/22 1355       Knowledge Questionnaire Score   Pre Score 25/28             Core Components/Risk Factors/Patient Goals at Admission:  Personal Goals and Risk Factors at Admission - 07/04/22 1419       Core Components/Risk Factors/Patient Goals on Admission    Weight Management Yes    Intervention Weight Management:  Develop a combined nutrition and exercise program  designed to reach desired caloric intake, while maintaining appropriate intake of nutrient and fiber, sodium and fats, and appropriate energy expenditure required for the weight goal.    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 Introduction to pritikin education/ intensive cardiac rehab. Initial orientation packet reviewed with patient. 30 day ITP Review.  Kartik is scheduled to begin intensive cardiac rehab on 07/10/22               Comments: See ITP comments.Harrell Gave RN BSN

## 2022-07-09 NOTE — Progress Notes (Signed)
[address]       [place] [address]             [phone]     HPI: [name] returns for follow-up of his left upper lobe lung nodule.  Brandon [name] is a 67 year old gentleman with a history of CAD, MI, heart failure, LVAD, ICD, hypertension, hyperlipidemia, ankylosing spondylitis, and tobacco abuse.  40-pack-year history of smoking prior to quitting 2 and half years ago.  In December he had a low-dose screening CT which showed a new left upper lobe lung nodule.  Had 2 additional CTs that month which showed no significant change.  I saw him in January and recommended a PET/CT for follow-up.  He had that done at the end of January.  He had some cardiac issues in December including ventricular fibrillation.  Then an episode of chest pain and shortness of breath.  At catheterization he had stable CAD.  No additional chest pain, shortness of breath, or diaphoresis since then.  Past Medical History:  Diagnosis Date   AICD (automatic cardioverter/defibrillator) present 08/31/2019   Ankylosing spondylitis ([place])    Arthritis    BENIGN PROSTATIC HYPERTROPHY 06/07/2008   CHF (congestive heart failure) ([place])    COLONIC POLYPS, HX OF 06/07/2008   Coronary artery disease    Depression    H/O hiatal hernia    Heart failure ([place])    HYPERLIPIDEMIA 06/07/2008   HYPERTENSION 06/07/2008   Myocardial infarction [facility]) 2005   NSTEMI, s/p LAD stent   NEPHROLITHIASIS, HX OF 06/07/2008   STEMI (ST elevation myocardial infarction) ([place]) 04/27/2019    Current Outpatient Medications  Medication Sig Dispense Refill   acetaminophen (TYLENOL) 500 MG tablet Take 500-1,000 mg by mouth every 6 (six) hours as needed (for pain).     aspirin EC 81 MG tablet Take 1 tablet (81 mg total) by mouth daily. Swallow whole. 90 tablet 1   atorvastatin (LIPITOR) 80 MG tablet TAKE 1 TABLET BY MOUTH ONCE DAILY AT 6PM. (Patient taking differently: Take 80 mg by mouth at bedtime.) 90 tablet 3    Carboxymethylcellulose Sodium (ARTIFICIAL TEARS OP) Place 1 drop into both eyes daily as needed (for dryness or irritation).     eplerenone (INSPRA) 50 MG tablet Take 1 tablet (50 mg total) by mouth in the morning. 30 tablet 6   ezetimibe (ZETIA) 10 MG tablet Take 1 tablet (10 mg total) by mouth daily. 90 tablet 3   gabapentin (NEURONTIN) 300 MG capsule Take 1 capsule (300 mg total) by mouth 3 (three) times daily. 90 capsule 3   losartan (COZAAR) 25 MG tablet Take 0.5 tablets (12.5 mg total) by mouth daily. 30 tablet 5   melatonin 5 MG TABS Take 1 tablet (5 mg total) by mouth at bedtime. 30 tablet 6   metoprolol succinate (TOPROL-XL) 25 MG 24 hr tablet Take 0.5 tablets (12.5 mg total) by mouth daily. 45 tablet 1   ondansetron (ZOFRAN) 4 MG tablet Take 1 tablet (4 mg total) by mouth every 8 (eight) hours as needed for nausea or vomiting. 20 tablet 3   pantoprazole (PROTONIX) 40 MG tablet Take 1 tablet (40 mg total) by mouth 2 (two) times daily. 60 tablet 3   potassium chloride SA (KLOR-CON M) 20 MEQ tablet Take 1 tablet (20 mEq total) by mouth daily. 90 tablet 3   sertraline (ZOLOFT) 50 MG tablet TAKE 1/2 TAB BY MOUTH DAILY FOR 1 WEEK, THEN INCREASE TO 1 TABLET  DAILY. 90 tablet 3   tamsulosin (FLOMAX) 0.4 MG CAPS capsule Take 1 capsule (0.4 mg total) by mouth daily. 90 capsule 3   torsemide (DEMADEX) 20 MG tablet Take 1 tablet (20 mg total) by mouth as needed. 30 tablet 5   traMADol (ULTRAM) 50 MG tablet TAKE 1-2 TABLETS BY MOUTH TWICE A DAY FOR 30 DAYS 120 tablet 1   warfarin (COUMADIN) 2.5 MG tablet Take 7.5 mg (3 tablets) by mouth daily or as instructed by LVAD clinic. (Patient taking differently: Take 2.5 mg by mouth daily at 4 PM. Take 5 mg (2 tablets) on Monday, and 7.5 mg (3 tablets) by mouth daily all other days, or as instructed by LVAD clinic.) 270 tablet 1   ZYRTEC ALLERGY 10 MG tablet Take 10 mg by mouth daily.     No current facility-administered medications for this visit.     Physical Exam Ht 6' (1.829 m)   Wt 212 lb (96.2 kg)   BMI 28.13 kg/m  67 year old man in no acute distress Alert and oriented x 3 with no focal deficits  Diagnostic Tests: NUCLEAR MEDICINE PET SKULL BASE TO THIGH   TECHNIQUE: 10.5 mCi F-18 FDG was injected intravenously. Full-ring PET imaging was performed from the skull base to thigh after the radiotracer. CT data was obtained and used for attenuation correction and anatomic localization.   Fasting blood glucose: 117 mg/dl   COMPARISON:  CT May 09, 2022 and December 01, 2020   FINDINGS: Mediastinal blood pool activity: SUV max 1.9   Liver activity: SUV max NA   NECK: No hypermetabolic cervical lymph nodes   Hypermetabolic 6 mm nodule in the right parotid gland on image 25/4 has a max SUV of 3.0.   Incidental CT findings: None.   CHEST: Hypermetabolic left upper lobe pulmonary nodule measures 8 mm on image 48/7 with a max SUV of 5.5.   Minimally metabolic precarinal lymph node measures 6 mm in short axis on image 60/4 with a max SUV of 2.2.   Incidental CT findings: Left ventricular assist device in place. Calcifications of the aortic valve and annulus. Mitral annular calcifications. Coronary artery calcifications. Generator in the right chest wall.   ABDOMEN/PELVIS: No abnormal hypermetabolic activity within the liver, pancreas, adrenal glands, or spleen. No hypermetabolic lymph nodes in the abdomen or pelvis.   Incidental CT findings: Left upper quadrant LVAD with anterior abdominal wall drive line cholelithiasis. Aortic atherosclerosis.   SKELETON: No focal hypermetabolic activity to suggest skeletal metastasis.   Incidental CT findings: Posterior lumbosacral fixation hardware. Diffuse demineralization of bone. Multilevel degenerative change of the spine.   IMPRESSION: 1. Hypermetabolic 8 mm left upper lobe pulmonary nodule is concerning for primary bronchogenic neoplasm. 2. Minimally metabolic  precarinal lymph node is nonspecific but favored reactive. Although nodal disease involvement is not entirely excluded. Consider further evaluation with tissue sampling. 3. 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 4.  Aortic Atherosclerosis (ICD10-I70.0).     Electronically Signed   By: Dahlia Bailiff M.D.   On: 06/12/2022 14:10   I personally reviewed the PET/CT images.  There is a left upper lobe nodule that is hypermetabolic.  Minimal activity in a normal size 4R lymph node.  Impression: Brandon [name] is a 67 year old gentleman with a history of CAD, MI, heart failure, LVAD, ICD, hypertension, hyperlipidemia, ankylosing spondylitis, and tobacco abuse.  40-pack-year history of smoking prior to quitting 2 and half  years ago.  Left upper lobe lung nodule-increase in size and hypermetabolic on PET/CT.  Findings are consistent with a new primary bronchogenic carcinoma.  The lymph node is not impressive.  Clinical stage is 1A (T1, N0).  Surgery would be extremely high risk particularly with his relatively acute cardiac issues at in December.  Stereotactic radiation is likely a better option in his case.  We discussed the relative pros and cons of each approach.  After discussion he favors radiation like to speak to radiation oncology.  Biopsy is possible but would be complicated due to anticoagulation issues in the LVAD.  I think there is enough suspicion to treat based on radiographic imaging alone, but would defer to radiation oncology's opinion as they would be the ones treating.  Plan: Will refer to radiation oncology for consideration of stereotactic radiation without biopsy  If biopsy is necessary we can arrange for navigational bronchoscopy.  Will have to coordinate with advanced heart failure team.  If he decides not to pursue radiation, I will see him back and we can talk about the risks involved with  the resection in the setting.  I spent over 20 minutes in review of records, images, and in consultation with Brandon [name] today [name], MD [facility] [phone]

## 2022-07-10 ENCOUNTER — Encounter (HOSPITAL_COMMUNITY)
Admission: RE | Admit: 2022-07-10 | Discharge: 2022-07-10 | Disposition: A | Discharge status: Home / Self Care | Payer: Medicare Other | Source: Ambulatory Visit | Attending: Internal Medicine | Admitting: Internal Medicine

## 2022-07-10 ENCOUNTER — Ambulatory Visit (HOSPITAL_COMMUNITY): Payer: Medicare Other

## 2022-07-10 VITALS — Ht 72.0 in | Wt 211.6 lb

## 2022-07-10 DIAGNOSIS — I2102 ST elevation (STEMI) myocardial infarction involving left anterior descending coronary artery: Secondary | ICD-10-CM

## 2022-07-10 DIAGNOSIS — I5042 Chronic combined systolic (congestive) and diastolic (congestive) heart failure: Secondary | ICD-10-CM

## 2022-07-10 NOTE — Progress Notes (Signed)
Daily Session Note  Patient Details  Name: Brandon [name] MRN: [id number] Date of Birth: 1955-09-05 Referring Provider:   [place] from 07/04/2022 in [facility]  Referring Provider Dr. [name]       Encounter Date: 07/10/2022  Check In:  Session Check In - 07/10/22 1513       Check-In   Supervising physician immediately available to respond to emergencies [facility] - Physician supervision    Physician(s) Fabian Sharp, PA    Location [facility]    Staff Present Barnet Pall, RN, BSN;Jetta Walker BS, ACSM-CEP, Exercise Physiologist;Olinty Celesta Aver, MS, ACSM-CEP, Exercise Physiologist;Johnny Starleen Blue, MS, Exercise Physiologist;Bailey Pearline Cables, MS, Exercise Physiologist;David Makemson, MS, ACSM-CEP, CCRP, Exercise Physiologist    Virtual Visit No    Medication changes reported     No    Fall or balance concerns reported    No    Tobacco Cessation No Change    Warm-up and Cool-down Performed as group-led instruction    Resistance Training Performed No    VAD Patient? Yes    PAD/SET Patient? No      VAD patient   Has back up controller? Yes    Has spare charged batteries? Yes    Has battery cables? Yes    Has compatible battery clips? Yes      Pain Assessment   Currently in Pain? No/denies    Pain Score 0-No pain    Multiple Pain Sites No             Capillary Blood Glucose: No results found for this or any previous visit (from the past 24 hour(s)).   Exercise Prescription Changes - 07/10/22 1630       Response to Exercise   Blood Pressure (Admit) 82/0   VAD-MAP   Blood Pressure (Exercise) 84/0    Blood Pressure (Exit) 84/0    Heart Rate (Admit) 118 bpm    Heart Rate (Exercise) 116 bpm    Heart Rate (Exit) 116 bpm    Rating of Perceived Exertion (Exercise) 11    Perceived Dyspnea (Exercise) 0    Symptoms Some ECG abnormalities, see nurse note    Comments Pt  first day in the CRP2 program    Duration Progress to 30 minutes of  aerobic without signs/symptoms of physical distress    Intensity THRR unchanged      Progression   Progression Continue to progress workloads to maintain intensity without signs/symptoms of physical distress.    Average METs 3.4      Resistance Training   Training Prescription No      Recumbant Bike   Level 1    RPM 75    Watts 17    Minutes 15    METs 2.2      NuStep   Level 1    SPM 123    Minutes 15    METs 3.4             Social History   Tobacco Use  Smoking Status Former   Packs/day: 1.00   Years: 40.00   Total pack years: 40.00   Types: Cigarettes   Quit date: 07/26/2019   Years since quitting: 2.9  Smokeless Tobacco Never  Tobacco Comments   1-1.5 pks per year x 40-45 yrs    Goals Met:  Exercise tolerated well No report of concerns or symptoms today  Goals Unmet:  Not Applicable  Comments: Pt started cardiac rehab today.  Pt tolerated light exercise without difficulty. VSS, telemetry-Regular rhythm with PVC's, asymptomatic. Dr [name] reviewed today's ECG tracings. No new orders reviewed.   Medication list reconciled. Pt denies barriers to medicaiton compliance.  PSYCHOSOCIAL ASSESSMENT:  PHQ-1. Pt exhibits positive coping skills, hopeful outlook with supportive family. No psychosocial needs identified at this time, no psychosocial interventions necessary.    Pt enjoys going to [place] and cooking.   Pt oriented to exercise equipment and routine.    Understanding verbalized.Harrell Gave RN BSN    Dr. Fransico Him is Medical Director for Cardiac Rehab at [facility].

## 2022-07-10 NOTE — Progress Notes (Addendum)
Questionable 13 beat run of Vtach rate 120-130. During cool down post exercise.  Patient asymptomatic. Map 84. LVAD coordinator Emerson Monte paged and notified. Ebony Hail said that Brandon [name] is okay to go home as long as he feels okay.  Will fax exercise flow sheet and ECG for review via media. LVAD coordinator and Dr [name] reviewed today's ECG tracings. No new order's received. Will continue to monitor the patient throughout  the program. Harrell Gave RN BSN

## 2022-07-11 ENCOUNTER — Ambulatory Visit (HOSPITAL_COMMUNITY): Payer: Self-pay | Admitting: Pharmacist

## 2022-07-11 DIAGNOSIS — Z95811 Presence of heart assist device: Secondary | ICD-10-CM

## 2022-07-11 DIAGNOSIS — Z7901 Long term (current) use of anticoagulants: Secondary | ICD-10-CM

## 2022-07-11 LAB — POCT INR: INR: 3 (ref 2.0–3.0)

## 2022-07-12 ENCOUNTER — Ambulatory Visit (HOSPITAL_COMMUNITY): Payer: Medicare Other

## 2022-07-12 ENCOUNTER — Encounter (HOSPITAL_COMMUNITY): Payer: Medicare Other

## 2022-07-12 ENCOUNTER — Other Ambulatory Visit (HOSPITAL_COMMUNITY): Payer: Self-pay

## 2022-07-12 ENCOUNTER — Other Ambulatory Visit: Payer: Self-pay

## 2022-07-12 NOTE — Progress Notes (Signed)
The proposed treatment discussed in conference is for discussion purpose only and is not a binding recommendation.  The patients have not been physically examined, or presented with their treatment options.  Therefore, final treatment plans cannot be decided.  

## 2022-07-12 NOTE — Progress Notes (Incomplete)
Thoracic Location of Tumor / Histology:  Bronchogenic carcinoma of left lung   CT ANGIO CHEST AORTA W/CM & OR WO/CM  IMPRESSION: Status post aortic valve repair and left ventricular assist device placement.   Coronary artery calcifications are noted suggesting coronary disease.   Lung fields are not completely visualized as the lung apices are not included in field-of-view. There is again noted 7 mm irregular nodule in left upper lobe which was not present in 2022 and is concerning for malignancy. As noted on prior CT scan of April 30, 2022, consultation with pulmonology and thoracic surgery is recommended.   Aortic Atherosclerosis (ICD10-I70.0) and Emphysema (ICD10-J43.9).  On: 05/09/2022 13:42  PET Image Initial (PI) Skull Base To Thigh  IMPRESSION: 1. Hypermetabolic 8 mm left upper lobe pulmonary nodule is concerning for primary bronchogenic neoplasm. 2. Minimally metabolic precarinal lymph node is nonspecific but favored reactive. Although nodal disease involvement is not entirely excluded. Consider further evaluation with tissue sampling. 3. 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 4.  Aortic Atherosclerosis (ICD10-I70.0). On: 06/12/2022 14:10  PET SUPER D CT  IMPRESSION: 1. Increased size of the spiculated left upper lobe pulmonary nodule which is hypermetabolic on same day PET-CT, concerning for primary bronchogenic carcinoma. 2. No pathologically enlarged thoracic lymph nodes on this noncontrast study. 3. Aortic Atherosclerosis (ICD10-I70.0) and Emphysema (ICD10-J43.9).  On: 06/12/2022 14:21   Patient presented with symptoms of:  He had a low-dose screening CT in December which showed a new left upper lobe lung nodule.  He had 2 additional CTs later in the month which also showed the nodule without significant change.  He had a complicated month of December.  On 04/22/2022 he  presented with shortness of breath and diaphoresis.  Found to be in ventricular fibrillation.  Was defibrillated.  Then on Christmas day he presented back with chest pain, nausea, and shortness of breath.  ECG showed ST elevation.  Catheterization showed stable CAD.  He has not had any further issues since that time.    Biopsies revealed: Biopsy not performed due to ICD and other medical history per Dr. Leonarda Salon note on 05-28-22  Tobacco/Marijuana/Snuff/ETOH use:  Brandon [name] is a 67 year old man with a complicated medical history including CAD, MI, heart failure, left ventricular assist device placement, ICD placement, hypertension, hyperlipidemia, ankylosing spondylitis, and tobacco abuse.  He smoked about a pack a day for 40 years prior to quitting about 2 1/2 years ago.   Past/Anticipated interventions by cardiothoracic surgery, if any:  Left upper lobe nodule-concerning for possible primary bronchogenic carcinoma.  Infectious and inflammatory nodules are also in the differential.  Less than a centimeter but is solid.  I recommended that we do a follow-up scan in 6 weeks.  That will be about 3 months from his original scan.  Will do a PET/CT to see if there is any associated metabolic activity.  Will use super D protocol for the CT portion to allow for planning for possible navigational bronchoscopy.   Given his complex medical history, stereotactic radiation is likely the best option for treatment if the nodule is cancerous.   Will await PET results.  If highly suspicious could consider stereotactic radiation without a biopsy, as any biopsy will be complicated given his assist device.  If the PET is not remarkable we may be able to follow-up the nodule.   LVAD-doing well symptomatically currently after complicated month December.   Tobacco abuse-quit smoking  2 and half years ago.   Plan: Return in 6 weeks with PET/CT   [name], MD  Past/Anticipated interventions by  medical oncology, if any: None at this time  Signs/Symptoms Weight changes, if any: {:18581} Respiratory complaints, if any: {:18581} Hemoptysis, if any: {:18581} Pain issues, if any:  {:18581}  SAFETY ISSUES: Prior radiation? {:18581} Pacemaker/ICD? ICD, LVAD Possible current pregnancy?{:18581} Is the patient on methotrexate? {:18581}  Current Complaints / other details:  ***

## 2022-07-14 NOTE — Progress Notes (Signed)
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 A diagnosis of Lung nodule was also pertinent to this visit.  PET avid left upper lobe pulmonary nodule   HISTORY OF PRESENT ILLNESS::Brandon [name] is a 67 y.o. male former smoker with a 40 year smoking history. Today, he is accompanied by his sister. he is seen as a courtesy of Dr. Roxan Hockey for an opinion concerning radiation therapy as part of management for his recently diagnosed LUL pulmonary nodule.   The patient presented for a lung cancer screening CT on 04/30/22 which revealed a new solid nodule in the left upper lobe measuring approximately 9 mm (Lung-RADS 4B, suspicious).   Shortly after this finding, the patient was hospitalized from 05/06/22 through 05/12/22 for management of MI (s/p aortic valve repair and left ventricular assist device placement). CTA of the chest performed while inpatient on 05/09/22 redemonstrated the 7 mm irregular nodule in the left upper lobe with features concerning for malignancy. (See encounter notes for full hospital course).  He has not had any further cardiac issues/episodes since discharge.   -- CTA of the chest also performed on 05/06/22 showed multifocal airspace infiltrates within the dependent right upper lobe and lower lobes bilaterally which possibly reflected asymmetric alveolar pulmonary edema or superimposed acute pneumonic infiltrate. CT also showed mild interstitial pulmonary edema (likely cardiogenic in nature), and small right and trace left pleural effusions.  Following discharge, the patient was accordingly referred to Dr. Roxan Hockey on 05/28/22 for  further management. In light of his complex medical history, and most notably his history of cardiac issues, Dr. Roxan Hockey discussed that stereotactic radiation would likely be the best treatment option for him.   PET scan on 06/12/22 ordered by Dr. 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  CT of the chest also performed on 06/12/22 redemonstrated an increase in size of the spiculated LUL nodule. CT otherwise showed no pathologically enlarged thoracic lymph nodes.   Per Dr. Roxan Hockey, although a biopsy is possible, tissue sampling may be complicated due to anticoagulation issues in the LVAD. In light of this and since there is enough suspicion to pursue treatment based on radiographic imaging alone, Dr. Roxan Hockey referred the patient to me for consideration of radiation therapy, which we will discuss in detail today.   If biopsy is deemed absolutely necessary for treatment planning, Dr. Roxan Hockey is amendable to arrange for navigational bronchoscopy and will coordinate with the advanced heart failure team if needed.    PREVIOUS RADIATION THERAPY: No  PAST MEDICAL HISTORY:  Past Medical History:  Diagnosis Date   AICD (automatic cardioverter/defibrillator) present 08/31/2019   Ankylosing spondylitis ([place])    Arthritis    BENIGN PROSTATIC HYPERTROPHY 06/07/2008   CHF (congestive heart failure) ([place])    COLONIC POLYPS, HX OF 06/07/2008   Coronary artery disease  Depression    H/O hiatal hernia    Heart failure ([place])    HYPERLIPIDEMIA 06/07/2008   HYPERTENSION  06/07/2008   Myocardial infarction [facility]) 2005   NSTEMI, s/p LAD stent   NEPHROLITHIASIS, HX OF 06/07/2008   STEMI (ST elevation myocardial infarction) ([place]) 04/27/2019    PAST SURGICAL HISTORY: Past Surgical History:  Procedure Laterality Date   AORTIC VALVE REPLACEMENT N/A 04/06/2021   Procedure: AORTIC VALVE REPLACEMENT (AVR) WITH INSPIRIS RESILIA  AORTIC VALVE SIZE 25MM;  Surgeon: Gaye Pollack, MD;  Location: [place];  Service: Open Heart Surgery;  Laterality: N/A;   COLONOSCOPY WITH PROPOFOL N/A 04/24/2020   Procedure: COLONOSCOPY WITH PROPOFOL;  Surgeon: [name], MD;  Location[facility];  Service: Gastroenterology;  Laterality: N/A;   CORONARY ANGIOPLASTY WITH STENT PLACEMENT  2005   LAD stent, jailed diagonal   CORONARY/GRAFT ACUTE MI REVASCULARIZATION N/A 04/27/2019   Procedure: Coronary/Graft Acute MI Revascularization;  Surgeon: [name], MD;  Location: [place] [facility];  Service: Cardiovascular;  Laterality: N/A;   ICD IMPLANT  08/31/2019   ICD IMPLANT N/A 08/31/2019   Procedure: ICD IMPLANT;  Surgeon: [name], MD;  Location: [place] [facility];  Service: Cardiovascular;  Laterality: N/A;   ICD LEAD REMOVAL N/A 12/04/2020   Procedure: ICD SYSTEM EXTRACTION;  Surgeon: Vickie Epley, MD;  Location: [place];  Service: Cardiovascular;  Laterality: N/A;   INSERTION OF IMPLANTABLE LEFT VENTRICULAR ASSIST DEVICE N/A 04/06/2021   Procedure: INSERTION OF IMPLANTABLE LEFT VENTRICULAR ASSIST DEVICE;  Surgeon: Gaye Pollack, MD;  Location: [place];  Service: Open Heart Surgery;  Laterality: N/A;  HM3   IR THORACENTESIS ASP PLEURAL SPACE W/IMG GUIDE  04/19/2021   IR THORACENTESIS ASP PLEURAL SPACE W/IMG GUIDE  04/20/2021   IR THORACENTESIS ASP PLEURAL SPACE W/IMG GUIDE  04/23/2021   LAMINECTOMY     lumbar   LEFT HEART CATH AND CORONARY ANGIOGRAPHY N/A 04/27/2019   Procedure: LEFT HEART CATH AND CORONARY ANGIOGRAPHY;  Surgeon: [name], MD;   Location: [place] [facility];  Service: Cardiovascular;  Laterality: N/A;   LUMBAR FUSION  01/2012   POLYPECTOMY  04/24/2020   Procedure: POLYPECTOMY;  Surgeon: [name], MD;  [facility];  Service: Gastroenterology;;   RIGHT HEART CATH N/A 04/27/2019   Procedure: RIGHT HEART CATH;  Surgeon: [name], Ormand M, MD;  Location: [place] [facility];  Service: Cardiovascular;  Laterality: N/A;   RIGHT HEART CATH N/A 03/28/2021   Procedure: RIGHT HEART CATH;  Surgeon: Jolaine Artist, MD;  Location: [place] [facility];  Service: Cardiovascular;  Laterality: N/A;   RIGHT HEART CATH AND CORONARY ANGIOGRAPHY N/A 05/06/2022   Procedure: RIGHT HEART CATH AND CORONARY ANGIOGRAPHY;  Surgeon: Jolaine Artist, MD;  Location: [place] [facility];  Service: Cardiovascular;  Laterality: N/A;   RIGHT/LEFT HEART CATH AND CORONARY ANGIOGRAPHY N/A 11/29/2020   Procedure: RIGHT/LEFT HEART CATH AND CORONARY ANGIOGRAPHY;  Surgeon: Jolaine Artist, MD;  Location: [place] [facility];  Service: Cardiovascular;  Laterality: N/A;   TEE WITHOUT CARDIOVERSION N/A 12/04/2020   Procedure: TRANSESOPHAGEAL ECHOCARDIOGRAM (TEE);  Surgeon: Vickie Epley, MD;  Location: [place];  Service: Cardiovascular;  Laterality: N/A;   TEE WITHOUT CARDIOVERSION N/A 04/06/2021   Procedure: TRANSESOPHAGEAL ECHOCARDIOGRAM (TEE);  Surgeon: Gaye Pollack, MD;  Location: [place];  Service: Open Heart Surgery;  Laterality: N/A;   TONSILLECTOMY     warthin tumor removal Left 2014  FAMILY HISTORY:  Family History  Problem Relation Age of Onset   Depression Father    Arthritis Sister        RA   Heart failure Mother    Other Neg Hx    Colon cancer Neg Hx    Esophageal cancer Neg Hx    Rectal cancer Neg Hx    Stomach cancer Neg Hx    Colon polyps Neg Hx     SOCIAL HISTORY:  Social History   Tobacco Use   Smoking status: Former    Packs/day: 1.00    Years: 40.00    Total pack years: 40.00    Types:  Cigarettes    Quit date: 07/26/2019    Years since quitting: 2.9   Smokeless tobacco: Never   Tobacco comments:    1-1.5 pks per year x 40-45 yrs  Vaping Use   Vaping Use: Some days   Substances: CBD  Substance Use Topics   Alcohol use: Yes    Alcohol/week: 7.0 standard drinks of alcohol    Types: 7 Shots of liquor per week    Comment: "2 vodka tonics a night"   Drug use: Yes    Comment: occasionally - CBD oil    ALLERGIES: No Known Allergies  MEDICATIONS:  Current Outpatient Medications  Medication Sig Dispense Refill   acetaminophen (TYLENOL) 500 MG tablet Take 500-1,000 mg by mouth every 6 (six) hours as needed (for pain).     aspirin EC 81 MG tablet Take 1 tablet (81 mg total) by mouth daily. Swallow whole. 90 tablet 1   atorvastatin (LIPITOR) 80 MG tablet TAKE 1 TABLET BY MOUTH ONCE DAILY AT 6PM. (Patient taking differently: Take 80 mg by mouth at bedtime.) 90 tablet 3   Carboxymethylcellulose Sodium (ARTIFICIAL TEARS OP) Place 1 drop into both eyes daily as needed (for dryness or irritation).     eplerenone (INSPRA) 50 MG tablet Take 1 tablet (50 mg total) by mouth in the morning. 30 tablet 6   ezetimibe (ZETIA) 10 MG tablet Take 1 tablet (10 mg total) by mouth daily. 90 tablet 3   gabapentin (NEURONTIN) 300 MG capsule Take 1 capsule (300 mg total) by mouth 3 (three) times daily. 90 capsule 3   losartan (COZAAR) 25 MG tablet Take 0.5 tablets (12.5 mg total) by mouth daily. 30 tablet 5   metoprolol succinate (TOPROL-XL) 25 MG 24 hr tablet Take 0.5 tablets (12.5 mg total) by mouth daily. 45 tablet 1   pantoprazole (PROTONIX) 40 MG tablet Take 1 tablet (40 mg total) by mouth 2 (two) times daily. 60 tablet 3   potassium chloride SA (KLOR-CON M) 20 MEQ tablet Take 1 tablet (20 mEq total) by mouth daily. 90 tablet 3   sertraline (ZOLOFT) 50 MG tablet TAKE 1/2 TAB BY MOUTH DAILY FOR 1 WEEK, THEN INCREASE TO 1 TABLET DAILY. 90 tablet 3   tamsulosin (FLOMAX) 0.4 MG CAPS capsule Take 1  capsule (0.4 mg total) by mouth daily. 90 capsule 3   warfarin (COUMADIN) 2.5 MG tablet Take 7.5 mg (3 tablets) by mouth daily or as instructed by LVAD clinic. (Patient taking differently: Take 2.5 mg by mouth daily at 4 PM. Take 5 mg (2 tablets) on Monday, and 7.5 mg (3 tablets) by mouth daily all other days, or as instructed by LVAD clinic.) 270 tablet 1   ZYRTEC ALLERGY 10 MG tablet Take 10 mg by mouth daily.     melatonin 5 MG TABS Take 1 tablet (5  mg total) by mouth at bedtime. (Patient not taking: Reported on 07/15/2022) 30 tablet 6   ondansetron (ZOFRAN) 4 MG tablet Take 1 tablet (4 mg total) by mouth every 8 (eight) hours as needed for nausea or vomiting. (Patient not taking: Reported on 07/15/2022) 20 tablet 3   torsemide (DEMADEX) 20 MG tablet Take 1 tablet (20 mg total) by mouth as needed. (Patient not taking: Reported on 07/15/2022) 30 tablet 5   traMADol (ULTRAM) 50 MG tablet TAKE 1-2 TABLETS BY MOUTH TWICE A DAY FOR 30 DAYS (Patient not taking: Reported on 07/15/2022) 120 tablet 1   No current facility-administered medications for this encounter.    REVIEW OF SYSTEMS:  A 10+ POINT REVIEW OF SYSTEMS WAS OBTAINED including neurology, dermatology, psychiatry, cardiac, respiratory, lymph, extremities, GI, GU, musculoskeletal, constitutional, reproductive, HEENT.  He denies any recent history of hemoptysis.  He reports no significant breathing issues.   PHYSICAL EXAM:  height is 6' (1.829 m) and weight is 212 lb 8 oz (96.4 kg). His temporal temperature is 96.7 F (35.9 C) (abnormal). His blood pressure is 100/80 and his pulse is 74. His respiration is 18 and oxygen saturation is 99%.   General: Alert and oriented, in no acute distress HEENT: Head is normocephalic. Extraocular movements are intact. Oropharynx is clear. Neck: Neck is supple, no palpable cervical or supraclavicular lymphadenopathy. Heart: Mechanical heart sounds with LVAD hum present.  Chest: Clear to auscultation bilaterally, with  no rhonchi, wheezes, or rales. Abdomen: Soft, nontender, nondistended, with no rigidity or guarding.  Ventricular assist device in place along the left upper abdomen Extremities: No cyanosis or edema. Lymphatics: see Neck Exam Skin: No concerning lesions. Musculoskeletal: symmetric strength and muscle tone throughout. Neurologic: Cranial nerves II through XII are grossly intact. No obvious focalities. Speech is fluent. Coordination is intact. Psychiatric: Judgment and insight are intact. Affect is appropriate.   ECOG = 1  0 - Asymptomatic (Fully active, able to carry on all predisease activities without restriction)  1 - Symptomatic but completely ambulatory (Restricted in physically strenuous activity but ambulatory and able to carry out work of a light or sedentary nature. For example, light housework, office work)  2 - Symptomatic, <50% in bed during the day (Ambulatory and capable of all self care but unable to carry out any work activities. Up and about more than 50% of waking hours)  3 - Symptomatic, >50% in bed, but not bedbound (Capable of only limited self-care, confined to bed or chair 50% or more of waking hours)  4 - Bedbound (Completely disabled. Cannot carry on any self-care. Totally confined to bed or chair)  5 - Death   Eustace Pen MM, Creech RH, Tormey DC, et al. [phone]). "Toxicity and response criteria of the [facility]". [place] Oncol. 5 (6): 649-55  LABORATORY DATA:  Lab Results  Component Value Date   WBC 7.6 07/15/2022   HGB 13.1 07/15/2022   HCT 40.3 07/15/2022   MCV 80.9 07/15/2022   PLT 198 07/15/2022   NEUTROABS 13.3 (H) 05/06/2022   Lab Results  Component Value Date   NA 135 06/03/2022   K 4.2 06/03/2022   CL 104 06/03/2022   CO2 23 06/03/2022   GLUCOSE 94 06/03/2022   BUN 18 06/03/2022   CREATININE 1.07 06/03/2022   CALCIUM 8.8 (L) 06/03/2022      RADIOGRAPHY: No results found.    IMPRESSION: Left upper lobe pulmonary  nodule, PET avid  The patient's case was presented at  the multidisciplinary thoracic conference last week.  As above given his cardiac issues surgical intervention was not recommended.  Technically he would be a candidate for navigational biopsy but this would be somewhat risky given the ongoing need for anticoagulation needed for his LVAD.  Recommendations were to proceed with SBRT.  On Imaging the lesion appears to be a primary lung lesion and was PET avid on PET scan consistent with malignancy.  Given the above issues I feel the patient would be a candidate for SBRT without tissue diagnosis.  Today, I talked to the patient and his sister about the findings and work-up thus far.  We discussed the natural history of presumed non-small cell lung cancer and general treatment, highlighting the role of radiotherapy in the management.  We discussed the available radiation techniques, and focused on the details of logistics and delivery.  We reviewed the anticipated acute and late sequelae associated with radiation in this setting.  The patient was encouraged to ask questions that I answered to the best of my ability.  A patient consent form was discussed and signed.  We retained a copy for our records.  The patient would like to proceed with radiation and will be scheduled for CT simulation.  PLAN: Will return next week for SBRT simulation,  treatment began approximately a week later.  Anticipate  3 SBRT sessions.     60 minutes of total time was spent for this patient encounter, including preparation, face-to-face counseling with the patient and coordination of care, physical exam, and documentation of the encounter.   ------------------------------------------------  Blair Promise, PhD, MD  This document serves as a record of services personally performed by Gery Pray, MD. It was created on his behalf by Roney Mans, a trained medical scribe. The creation of this record is based on the scribe's  personal observations and the provider's statements to them. This document has been checked and approved by the attending provider.

## 2022-07-15 ENCOUNTER — Encounter (HOSPITAL_COMMUNITY): Payer: Medicare Other

## 2022-07-15 ENCOUNTER — Ambulatory Visit (HOSPITAL_COMMUNITY)
Admission: RE | Admit: 2022-07-15 | Discharge: 2022-07-15 | Disposition: A | Discharge status: Home / Self Care | Payer: Medicare Other | Source: Ambulatory Visit | Attending: Cardiology | Admitting: Cardiology

## 2022-07-15 ENCOUNTER — Ambulatory Visit
Admission: RE | Admit: 2022-07-15 | Discharge: 2022-07-15 | Disposition: A | Discharge status: Home / Self Care | Payer: Medicare Other | Source: Ambulatory Visit | Attending: Radiation Oncology | Admitting: Radiation Oncology

## 2022-07-15 ENCOUNTER — Telehealth: Payer: Self-pay | Admitting: Family Medicine

## 2022-07-15 ENCOUNTER — Encounter: Payer: Self-pay | Admitting: Radiation Oncology

## 2022-07-15 ENCOUNTER — Other Ambulatory Visit: Payer: Self-pay

## 2022-07-15 ENCOUNTER — Telehealth (HOSPITAL_COMMUNITY): Payer: Self-pay | Admitting: Unknown Physician Specialty

## 2022-07-15 ENCOUNTER — Ambulatory Visit (HOSPITAL_COMMUNITY): Payer: Medicare Other

## 2022-07-15 ENCOUNTER — Ambulatory Visit (HOSPITAL_COMMUNITY): Payer: Self-pay | Admitting: Pharmacist

## 2022-07-15 ENCOUNTER — Other Ambulatory Visit (HOSPITAL_COMMUNITY): Payer: Self-pay | Admitting: Unknown Physician Specialty

## 2022-07-15 VITALS — BP 100/80 | HR 74 | Temp 96.7°F | Resp 18 | Ht 72.0 in | Wt 212.5 lb

## 2022-07-15 DIAGNOSIS — Z7901 Long term (current) use of anticoagulants: Secondary | ICD-10-CM | POA: Insufficient documentation

## 2022-07-15 DIAGNOSIS — R911 Solitary pulmonary nodule: Secondary | ICD-10-CM

## 2022-07-15 DIAGNOSIS — I110 Hypertensive heart disease with heart failure: Secondary | ICD-10-CM | POA: Diagnosis not present

## 2022-07-15 DIAGNOSIS — Z87891 Personal history of nicotine dependence: Secondary | ICD-10-CM | POA: Insufficient documentation

## 2022-07-15 DIAGNOSIS — Z79899 Other long term (current) drug therapy: Secondary | ICD-10-CM | POA: Insufficient documentation

## 2022-07-15 DIAGNOSIS — I509 Heart failure, unspecified: Secondary | ICD-10-CM | POA: Diagnosis not present

## 2022-07-15 DIAGNOSIS — Z7982 Long term (current) use of aspirin: Secondary | ICD-10-CM | POA: Insufficient documentation

## 2022-07-15 DIAGNOSIS — E785 Hyperlipidemia, unspecified: Secondary | ICD-10-CM | POA: Insufficient documentation

## 2022-07-15 DIAGNOSIS — C3412 Malignant neoplasm of upper lobe, left bronchus or lung: Secondary | ICD-10-CM

## 2022-07-15 DIAGNOSIS — I2510 Atherosclerotic heart disease of native coronary artery without angina pectoris: Secondary | ICD-10-CM | POA: Diagnosis not present

## 2022-07-15 DIAGNOSIS — I252 Old myocardial infarction: Secondary | ICD-10-CM | POA: Insufficient documentation

## 2022-07-15 DIAGNOSIS — Z95811 Presence of heart assist device: Secondary | ICD-10-CM | POA: Insufficient documentation

## 2022-07-15 LAB — CBC
HCT: 40.3 % (ref 39.0–52.0)
Hemoglobin: 13.1 g/dL (ref 13.0–17.0)
MCH: 26.3 pg (ref 26.0–34.0)
MCHC: 32.5 g/dL (ref 30.0–36.0)
MCV: 80.9 fL (ref 80.0–100.0)
Platelets: 198 10*3/uL (ref 150–400)
RBC: 4.98 MIL/uL (ref 4.22–5.81)
RDW: 16.8 % — ABNORMAL HIGH (ref 11.5–15.5)
WBC: 7.6 10*3/uL (ref 4.0–10.5)
nRBC: 0 % (ref 0.0–0.2)

## 2022-07-15 LAB — PROTIME-INR
INR: 3 — ABNORMAL HIGH (ref 0.8–1.2)
Prothrombin Time: 30.9 seconds — ABNORMAL HIGH (ref 11.4–15.2)

## 2022-07-15 NOTE — Telephone Encounter (Signed)
Pt called the VAD office this morning stating that his stool has been black x 5 days. Pt denies any other issues. No weakness and no dizziness. Last INR was 3 on 2/29. Pt has a goal INR of 2.5-3 and is taking 81 mg ASA. Will have pt come to clinic this afternoon for CBC and repeat INR.  Cbc is normal and INR is 3. Lauren will reach out to pt and have him recheck on his home monitor on Thursday. Pt was instructed to let us know if he develops anymore issues with bleeding. Pt has f/u in VAD clinic on 3/25.   Tanda Rockers RN, BSN VAD Coordinator 24/7 Pager [phone]

## 2022-07-15 NOTE — Telephone Encounter (Signed)
Contacted Brandon [name] to schedule their annual wellness visit. Appointment made for 07/26/22.  Brandon [name] AWV direct phone # [phone]   Patient called to r/s his appt

## 2022-07-15 NOTE — Progress Notes (Addendum)
Thoracic Location of Tumor / Histology:  Bronchogenic carcinoma of left lung   CT ANGIO CHEST AORTA W/CM & OR WO/CM  IMPRESSION: Status post aortic valve repair and left ventricular assist device placement.   Coronary artery calcifications are noted suggesting coronary disease.   Lung fields are not completely visualized as the lung apices are not included in field-of-view. There is again noted 7 mm irregular nodule in left upper lobe which was not present in 2022 and is concerning for malignancy. As noted on prior CT scan of April 30, 2022, consultation with pulmonology and thoracic surgery is recommended.   Aortic Atherosclerosis (ICD10-I70.0) and Emphysema (ICD10-J43.9).  On: 05/09/2022 13:42  PET Image Initial (PI) Skull Base To Thigh  IMPRESSION: 1. Hypermetabolic 8 mm left upper lobe pulmonary nodule is concerning for primary bronchogenic neoplasm. 2. Minimally metabolic precarinal lymph node is nonspecific but favored reactive. Although nodal disease involvement is not entirely excluded. Consider further evaluation with tissue sampling. 3. 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 4.  Aortic Atherosclerosis (ICD10-I70.0). On: 06/12/2022 14:10  PET SUPER D CT  IMPRESSION: 1. Increased size of the spiculated left upper lobe pulmonary nodule which is hypermetabolic on same day PET-CT, concerning for primary bronchogenic carcinoma. 2. No pathologically enlarged thoracic lymph nodes on this noncontrast study. 3. Aortic Atherosclerosis (ICD10-I70.0) and Emphysema (ICD10-J43.9).  On: 06/12/2022 14:21   Patient presented with symptoms of:  He had a low-dose screening CT in December which showed a new left upper lobe lung nodule.  He had 2 additional CTs later in the month which also showed the nodule without significant change.  He had a complicated month of December.  On 04/22/2022 he  presented with shortness of breath and diaphoresis.  Found to be in ventricular fibrillation.  Was defibrillated.  Then on Christmas day he presented back with chest pain, nausea, and shortness of breath.  ECG showed ST elevation.  Catheterization showed stable CAD.  He has not had any further issues since that time.    Biopsies revealed: Biopsy not performed due to ICD and other medical history per Dr. Leonarda Salon note on 05-28-22  Tobacco/Marijuana/Snuff/ETOH use:  Clearance Christe is a 67 year old man with a complicated medical history including CAD, MI, heart failure, left ventricular assist device placement, ICD placement, hypertension, hyperlipidemia, ankylosing spondylitis, and tobacco abuse.  He smoked about a pack a day for 40 years prior to quitting about 2 1/2 years ago.   Past/Anticipated interventions by cardiothoracic surgery, if any:  Left upper lobe nodule-concerning for possible primary bronchogenic carcinoma.  Infectious and inflammatory nodules are also in the differential.  Less than a centimeter but is solid.  I recommended that we do a follow-up scan in 6 weeks.  That will be about 3 months from his original scan.  Will do a PET/CT to see if there is any associated metabolic activity.  Will use super D protocol for the CT portion to allow for planning for possible navigational bronchoscopy.   Given his complex medical history, stereotactic radiation is likely the best option for treatment if the nodule is cancerous.   Will await PET results.  If highly suspicious could consider stereotactic radiation without a biopsy, as any biopsy will be complicated given his assist device.  If the PET is not remarkable we may be able to follow-up the nodule.   LVAD-doing well symptomatically currently after complicated month December.   Tobacco abuse-quit smoking  2 and half years ago.   Plan: Return in 6 weeks with PET/CT   [name], MD  Past/Anticipated interventions by  medical oncology, if any: None at this time  Signs/Symptoms Weight changes, if any: no Respiratory complaints, if any: yes,with activity. Hemoptysis, if any: no Pain issues, if any:  no  SAFETY ISSUES: Prior radiation? no Pacemaker/ICD? ICD, Patient states that he has a LVAD  Dr.Bensimhon Possible current pregnancy?no Is the patient on methotrexate? no  Current Complaints / other details:   Vitals:   07/15/22 1228  BP: 100/80  Pulse: 74  Resp: 18  Temp: (!) 96.7 F (35.9 C)  TempSrc: Temporal  SpO2: 99%  Weight: 96.4 kg  Height: 6' (1.829 m)

## 2022-07-17 ENCOUNTER — Ambulatory Visit (HOSPITAL_COMMUNITY): Payer: Medicare Other

## 2022-07-17 ENCOUNTER — Encounter (HOSPITAL_COMMUNITY)
Admission: RE | Admit: 2022-07-17 | Discharge: 2022-07-17 | Disposition: A | Discharge status: Home / Self Care | Payer: Medicare Other | Source: Ambulatory Visit | Attending: Internal Medicine | Admitting: Internal Medicine

## 2022-07-17 ENCOUNTER — Telehealth: Payer: Self-pay

## 2022-07-17 DIAGNOSIS — Z955 Presence of coronary angioplasty implant and graft: Secondary | ICD-10-CM | POA: Diagnosis not present

## 2022-07-17 DIAGNOSIS — Z95811 Presence of heart assist device: Secondary | ICD-10-CM | POA: Diagnosis not present

## 2022-07-17 DIAGNOSIS — I5042 Chronic combined systolic (congestive) and diastolic (congestive) heart failure: Secondary | ICD-10-CM | POA: Insufficient documentation

## 2022-07-17 DIAGNOSIS — I2102 ST elevation (STEMI) myocardial infarction involving left anterior descending coronary artery: Secondary | ICD-10-CM | POA: Insufficient documentation

## 2022-07-17 DIAGNOSIS — Z952 Presence of prosthetic heart valve: Secondary | ICD-10-CM | POA: Diagnosis not present

## 2022-07-18 ENCOUNTER — Telehealth (HOSPITAL_COMMUNITY): Payer: Self-pay

## 2022-07-18 ENCOUNTER — Ambulatory Visit (HOSPITAL_COMMUNITY): Payer: Self-pay | Admitting: Pharmacist

## 2022-07-18 ENCOUNTER — Encounter: Payer: Self-pay | Admitting: Student

## 2022-07-18 LAB — POCT INR: INR: 3.2 — AB (ref 2.0–3.0)

## 2022-07-18 NOTE — Telephone Encounter (Signed)
Nikki from Radiation Oncology called and states he is starting treatment soon and she wanted to know if he needs clearance and if so, what does she need to do. Her number is [phone]

## 2022-07-19 ENCOUNTER — Ambulatory Visit (HOSPITAL_COMMUNITY): Payer: Medicare Other

## 2022-07-19 ENCOUNTER — Encounter (HOSPITAL_COMMUNITY)
Admission: RE | Admit: 2022-07-19 | Discharge: 2022-07-19 | Disposition: A | Discharge status: Home / Self Care | Payer: Medicare Other | Source: Ambulatory Visit | Attending: Internal Medicine | Admitting: Internal Medicine

## 2022-07-19 DIAGNOSIS — I2102 ST elevation (STEMI) myocardial infarction involving left anterior descending coronary artery: Secondary | ICD-10-CM

## 2022-07-20 ENCOUNTER — Other Ambulatory Visit (HOSPITAL_COMMUNITY): Payer: Self-pay

## 2022-07-20 ENCOUNTER — Other Ambulatory Visit (HOSPITAL_COMMUNITY): Payer: Self-pay | Admitting: Internal Medicine

## 2022-07-20 DIAGNOSIS — I5023 Acute on chronic systolic (congestive) heart failure: Secondary | ICD-10-CM

## 2022-07-20 DIAGNOSIS — E785 Hyperlipidemia, unspecified: Secondary | ICD-10-CM

## 2022-07-22 ENCOUNTER — Encounter (HOSPITAL_COMMUNITY)
Admission: RE | Admit: 2022-07-22 | Discharge: 2022-07-22 | Disposition: A | Discharge status: Home / Self Care | Payer: Medicare Other | Source: Ambulatory Visit | Attending: Internal Medicine | Admitting: Internal Medicine

## 2022-07-22 ENCOUNTER — Ambulatory Visit (HOSPITAL_COMMUNITY): Payer: Medicare Other

## 2022-07-22 DIAGNOSIS — I2102 ST elevation (STEMI) myocardial infarction involving left anterior descending coronary artery: Secondary | ICD-10-CM

## 2022-07-23 ENCOUNTER — Other Ambulatory Visit: Payer: Self-pay

## 2022-07-23 ENCOUNTER — Ambulatory Visit
Admission: RE | Admit: 2022-07-23 | Discharge: 2022-07-23 | Disposition: A | Discharge status: Home / Self Care | Payer: Medicare Other | Source: Ambulatory Visit | Attending: Radiation Oncology | Admitting: Radiation Oncology

## 2022-07-23 DIAGNOSIS — C3412 Malignant neoplasm of upper lobe, left bronchus or lung: Secondary | ICD-10-CM | POA: Insufficient documentation

## 2022-07-23 DIAGNOSIS — R911 Solitary pulmonary nodule: Secondary | ICD-10-CM

## 2022-07-23 DIAGNOSIS — Z510 Encounter for antineoplastic radiation therapy: Secondary | ICD-10-CM | POA: Insufficient documentation

## 2022-07-24 ENCOUNTER — Ambulatory Visit (HOSPITAL_COMMUNITY): Payer: Medicare Other

## 2022-07-24 ENCOUNTER — Encounter (HOSPITAL_COMMUNITY)
Admission: RE | Admit: 2022-07-24 | Discharge: 2022-07-24 | Disposition: A | Discharge status: Home / Self Care | Payer: Medicare Other | Source: Ambulatory Visit | Attending: Internal Medicine | Admitting: Internal Medicine

## 2022-07-24 DIAGNOSIS — I2102 ST elevation (STEMI) myocardial infarction involving left anterior descending coronary artery: Secondary | ICD-10-CM | POA: Diagnosis not present

## 2022-07-25 ENCOUNTER — Ambulatory Visit (HOSPITAL_COMMUNITY): Payer: Self-pay | Admitting: Pharmacist

## 2022-07-25 DIAGNOSIS — Z510 Encounter for antineoplastic radiation therapy: Secondary | ICD-10-CM | POA: Diagnosis not present

## 2022-07-25 LAB — POCT INR: INR: 2.9 (ref 2.0–3.0)

## 2022-07-26 ENCOUNTER — Ambulatory Visit (INDEPENDENT_AMBULATORY_CARE_PROVIDER_SITE_OTHER): Payer: Medicare Other

## 2022-07-26 ENCOUNTER — Encounter (HOSPITAL_COMMUNITY)
Admission: RE | Admit: 2022-07-26 | Discharge: 2022-07-26 | Disposition: A | Discharge status: Home / Self Care | Payer: Medicare Other | Source: Ambulatory Visit | Attending: Internal Medicine | Admitting: Internal Medicine

## 2022-07-26 ENCOUNTER — Ambulatory Visit (HOSPITAL_COMMUNITY): Payer: Medicare Other

## 2022-07-26 VITALS — BP 120/60 | HR 94 | Temp 98.4°F | Ht 73.85 in | Wt 208.4 lb

## 2022-07-26 DIAGNOSIS — I2102 ST elevation (STEMI) myocardial infarction involving left anterior descending coronary artery: Secondary | ICD-10-CM

## 2022-07-26 DIAGNOSIS — Z0000 Encounter for general adult medical examination without abnormal findings: Secondary | ICD-10-CM | POA: Diagnosis not present

## 2022-07-26 NOTE — Progress Notes (Signed)
Subjective:   Brandon [name] is a 67 y.o. male who presents for Medicare Annual/Subsequent preventive examination.  Review of Systems     Cardiac Risk Factors include: advanced age (>53men, >101 women);hypertension;male gender     Objective:    Today's Vitals   07/26/22 0936 07/26/22 0940  BP: 120/60   Pulse: 94   Temp: 98.4 F (36.9 C)   TempSrc: Oral   SpO2: 98%   Weight: 208 lb 6.4 oz (94.5 kg)   Height: 6' 1.85" (1.876 m)   PainSc:  0-No pain   Body mass index is 26.87 kg/m.     07/15/2022   12:33 PM 05/08/2022   11:31 AM 07/19/2021    1:35 PM 05/29/2021    9:57 AM 03/22/2021    2:00 AM 01/08/2021    7:04 AM 11/29/2020    6:46 AM  Advanced Directives  Does Patient Have a Medical Advance Directive? Yes No Yes Yes No No Yes  Type of Advance Directive Living will  [place];Living will [place];Living will   [place];Living will  Does patient want to make changes to medical advance directive?   No - Patient declined    No - Patient declined  Copy of [place] in Chart? Yes - validated most recent copy scanned in chart (See row information)  No - copy requested Yes - validated most recent copy scanned in chart (See row information)   No - copy requested  Would patient like information on creating a medical advance directive?  No - Patient declined   No - Patient declined      Current Medications (verified) Outpatient Encounter Medications as of 07/26/2022  Medication Sig   acetaminophen (TYLENOL) 500 MG tablet Take 500-1,000 mg by mouth every 6 (six) hours as needed (for pain).   aspirin EC 81 MG tablet Take 1 tablet (81 mg total) by mouth daily. Swallow whole.   atorvastatin (LIPITOR) 80 MG tablet TAKE 1 TABLET BY MOUTH ONCE DAILY AT 6PM. (Patient taking differently: Take 80 mg by mouth at bedtime.)   Carboxymethylcellulose Sodium (ARTIFICIAL TEARS OP) Place 1 drop into both eyes daily as needed  (for dryness or irritation).   eplerenone (INSPRA) 50 MG tablet Take 1 tablet (50 mg total) by mouth in the morning.   ezetimibe (ZETIA) 10 MG tablet TAKE ONE TABLET BY MOUTH DAILY   gabapentin (NEURONTIN) 300 MG capsule Take 1 capsule (300 mg total) by mouth 3 (three) times daily.   losartan (COZAAR) 25 MG tablet Take 0.5 tablets (12.5 mg total) by mouth daily.   melatonin 5 MG TABS Take 1 tablet (5 mg total) by mouth at bedtime. (Patient not taking: Reported on 07/15/2022)   metoprolol succinate (TOPROL-XL) 25 MG 24 hr tablet Take 0.5 tablets (12.5 mg total) by mouth daily.   ondansetron (ZOFRAN) 4 MG tablet Take 1 tablet (4 mg total) by mouth every 8 (eight) hours as needed for nausea or vomiting. (Patient not taking: Reported on 07/15/2022)   pantoprazole (PROTONIX) 40 MG tablet Take 1 tablet (40 mg total) by mouth 2 (two) times daily.   potassium chloride SA (KLOR-CON M) 20 MEQ tablet Take 1 tablet (20 mEq total) by mouth daily.   sertraline (ZOLOFT) 50 MG tablet TAKE 1/2 TAB BY MOUTH DAILY FOR 1 WEEK, THEN INCREASE TO 1 TABLET DAILY.   tamsulosin (FLOMAX) 0.4 MG CAPS capsule Take 1 capsule (0.4 mg total) by mouth daily.  torsemide (DEMADEX) 20 MG tablet Take 1 tablet (20 mg total) by mouth as needed. (Patient not taking: Reported on 07/15/2022)   traMADol (ULTRAM) 50 MG tablet TAKE 1-2 TABLETS BY MOUTH TWICE A DAY FOR 30 DAYS (Patient not taking: Reported on 07/15/2022)   warfarin (COUMADIN) 2.5 MG tablet Take 7.5 mg (3 tablets) by mouth daily or as instructed by [facility] clinic. (Patient taking differently: Take 2.5 mg by mouth daily at 4 PM. Take 5 mg (2 tablets) on Monday, and 7.5 mg (3 tablets) by mouth daily all other days, or as instructed by [facility] clinic.)   ZYRTEC ALLERGY 10 MG tablet Take 10 mg by mouth daily.   No facility-administered encounter medications on file as of 07/26/2022.    Allergies (verified) Patient has no known allergies.   History: Past Medical History:  Diagnosis Date    AICD (automatic cardioverter/defibrillator) present 08/31/2019   Ankylosing spondylitis ([place])    Arthritis    BENIGN PROSTATIC HYPERTROPHY 06/07/2008   CHF (congestive heart failure) ([place])    COLONIC POLYPS, HX OF 06/07/2008   Coronary artery disease    Depression    H/O hiatal hernia    Heart failure ([place])    HYPERLIPIDEMIA 06/07/2008   HYPERTENSION 06/07/2008   Myocardial infarction [facility]) 2005   NSTEMI, s/p LAD stent   NEPHROLITHIASIS, HX OF 06/07/2008   STEMI (ST elevation myocardial infarction) ([place]) 04/27/2019   Past Surgical History:  Procedure Laterality Date   AORTIC VALVE REPLACEMENT N/A 04/06/2021   Procedure: AORTIC VALVE REPLACEMENT (AVR) WITH INSPIRIS RESILIA  AORTIC VALVE SIZE 25MM;  Surgeon: Gaye Pollack, MD;  Location: [place];  Service: Open Heart Surgery;  Laterality: N/A;   COLONOSCOPY WITH PROPOFOL N/A 04/24/2020   Procedure: COLONOSCOPY WITH PROPOFOL;  Surgeon: [name], MD;  Location: [facility];  Service: Gastroenterology;  Laterality: N/A;   CORONARY ANGIOPLASTY WITH STENT PLACEMENT  2005   LAD stent, jailed diagonal   CORONARY/GRAFT ACUTE MI REVASCULARIZATION N/A 04/27/2019   Procedure: Coronary/Graft Acute MI Revascularization;  Surgeon: [name], MD;  Location: [place] [facility];  Service: Cardiovascular;  Laterality: N/A;   ICD IMPLANT  08/31/2019   ICD IMPLANT N/A 08/31/2019   Procedure: ICD IMPLANT;  Surgeon: [name], MD;  Location: [place] [facility];  Service: Cardiovascular;  Laterality: N/A;   ICD LEAD REMOVAL N/A 12/04/2020   Procedure: ICD SYSTEM EXTRACTION;  Surgeon: Vickie Epley, MD;  Location: [place];  Service: Cardiovascular;  Laterality: N/A;   INSERTION OF IMPLANTABLE LEFT VENTRICULAR ASSIST DEVICE N/A 04/06/2021   Procedure: INSERTION OF IMPLANTABLE LEFT VENTRICULAR ASSIST DEVICE;  Surgeon: Gaye Pollack, MD;  Location: [place];  Service: Open Heart Surgery;  Laterality: N/A;  HM3   IR THORACENTESIS ASP  PLEURAL SPACE W/IMG GUIDE  04/19/2021   IR THORACENTESIS ASP PLEURAL SPACE W/IMG GUIDE  04/20/2021   IR THORACENTESIS ASP PLEURAL SPACE W/IMG GUIDE  04/23/2021   LAMINECTOMY     lumbar   LEFT HEART CATH AND CORONARY ANGIOGRAPHY N/A 04/27/2019   Procedure: LEFT HEART CATH AND CORONARY ANGIOGRAPHY;  Surgeon: [name], MD;  Location: [place] [facility];  Service: Cardiovascular;  Laterality: N/A;   LUMBAR FUSION  01/2012   POLYPECTOMY  04/24/2020   Procedure: POLYPECTOMY;  Surgeon: [name], MD;  [facility];  Service: Gastroenterology;;   RIGHT HEART CATH N/A 04/27/2019   Procedure: RIGHT HEART CATH;  Surgeon: [name], Philmore M, MD;  Location[facility]  INVASIVE CV LAB;  Service: Cardiovascular;  Laterality: N/A;   RIGHT HEART CATH N/A 03/28/2021   Procedure: RIGHT HEART CATH;  Surgeon: Jolaine Artist, MD;  Location: [place] [facility];  Service: Cardiovascular;  Laterality: N/A;   RIGHT HEART CATH AND CORONARY ANGIOGRAPHY N/A 05/06/2022   Procedure: RIGHT HEART CATH AND CORONARY ANGIOGRAPHY;  Surgeon: Jolaine Artist, MD;  Location: [place] CV LAB;  Service: Cardiovascular;  Laterality: N/A;   RIGHT/LEFT HEART CATH AND CORONARY ANGIOGRAPHY N/A 11/29/2020   Procedure: RIGHT/LEFT HEART CATH AND CORONARY ANGIOGRAPHY;  Surgeon: Jolaine Artist, MD;  Location: [place] [facility];  Service: Cardiovascular;  Laterality: N/A;   TEE WITHOUT CARDIOVERSION N/A 12/04/2020   Procedure: TRANSESOPHAGEAL ECHOCARDIOGRAM (TEE);  Surgeon: Vickie Epley, MD;  Location: [place];  Service: Cardiovascular;  Laterality: N/A;   TEE WITHOUT CARDIOVERSION N/A 04/06/2021   Procedure: TRANSESOPHAGEAL ECHOCARDIOGRAM (TEE);  Surgeon: Gaye Pollack, MD;  Location: [place];  Service: Open Heart Surgery;  Laterality: N/A;   TONSILLECTOMY     warthin tumor removal Left 2014   Family History  Problem Relation Age of Onset   Depression Father    Arthritis Sister        RA   Heart  failure Mother    Other Neg Hx    Colon cancer Neg Hx    Esophageal cancer Neg Hx    Rectal cancer Neg Hx    Stomach cancer Neg Hx    Colon polyps Neg Hx    Social History   Socioeconomic History   Marital status: Single    Spouse name: Not on file   Number of children: Not on file   Years of education: Not on file   Highest education level: Some college, no degree  Occupational History   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   Occupation: Retired  Tobacco Use   Smoking status: Former    Packs/day: 1.00    Years: 40.00    Additional pack years: 0.00    Total pack years: 40.00    Types: Cigarettes    Quit date: 07/26/2019    Years since quitting: 3.0   Smokeless tobacco: Never   Tobacco comments:    1-1.5 pks per year x 40-45 yrs  Vaping Use   Vaping Use: Some days   Substances: CBD  Substance and Sexual Activity   Alcohol use: Yes    Alcohol/week: 7.0 standard drinks of alcohol    Types: 7 Shots of liquor per week    Comment: "2 vodka tonics a night"   Drug use: Yes    Comment: occasionally - CBD oil   Sexual activity: Not Currently  Other Topics Concern   Not on file  Social History Narrative   Lives in [place] alone      Social Determinants of Health   Financial Resource Strain: Low Risk  (07/26/2022)   Overall Financial Resource Strain (CARDIA)    Difficulty of Paying Living Expenses: Not hard at all  Food Insecurity: No Food Insecurity (07/26/2022)   Hunger Vital Sign    Worried About Running Out of Food in the Last Year: Never true    Ran Out of Food in the Last Year: Never true  Transportation Needs: No Transportation Needs (07/26/2022)   PRAPARE - Hydrologist (Medical): No    Lack of Transportation (Non-Medical): No  Physical Activity: Sufficiently Active (09/10/2021)   Exercise Vital Sign  Days of Exercise per Week: 5 days    Minutes of Exercise per Session: 30 min  Recent Concern: Physical Activity -  Insufficiently Active (07/19/2021)   Exercise Vital Sign    Days of Exercise per Week: 5 days    Minutes of Exercise per Session: 20 min  Stress: No Stress Concern Present (07/26/2022)   [place]    Feeling of Stress : Not at all  Social Connections: Socially Isolated (07/26/2022)   Social Connection and Isolation Panel [NHANES]    Frequency of Communication with Friends and Family: More than three times a week    Frequency of Social Gatherings with Friends and Family: More than three times a week    Attends Religious Services: Never    Marine scientist or Organizations: No    Attends Music therapist: Never    Marital Status: Never married    Tobacco Counseling Counseling given: Not Answered Tobacco comments: 1-1.5 pks per year x 40-45 yrs   Clinical Intake:  Pre-visit preparation completed: Yes  Pain : No/denies pain Pain Score: 0-No pain     BMI - recorded: 26.87 Nutritional Status: BMI 25 -29 Overweight Nutritional Risks: None Diabetes: No  How often do you need to have someone help you when you read instructions, pamphlets, or other written materials from your doctor or pharmacy?: 1 - Never  Diabetic?  No  Interpreter Needed?: No  Information entered by :: Rolene Arbour LPN   Activities of Daily Living    07/26/2022    9:44 AM 07/21/2022    8:23 PM  In your present state of health, do you have any difficulty performing the following activities:  Hearing? 0 0  Vision? 0 0  Difficulty concentrating or making decisions? 0 0  Walking or climbing stairs? 0 0  Dressing or bathing? 0 0  Doing errands, shopping? 0 0  Preparing Food and eating ? N N  Using the Toilet? N N  In the past six months, have you accidently leaked urine? N N  Do you have problems with loss of bowel control? N N  Managing your Medications? N N  Managing your Finances? N N  Housekeeping or managing your  Housekeeping? N N    Patient Care Team: [name], MD as PCP - General (Family Medicine) Stanford Breed Denice Bors, MD as PCP - Cardiology (Cardiology) [name], MD (Inactive) as PCP - Electrophysiology (Cardiology) Bensimhon, Shaune Pascal, MD as PCP - Advanced Heart Failure (Cardiology) Gavin Pound, MD as Consulting Physician (Rheumatology) Mercie Eon, MD as Referring Physician (Rheumatology) Lbcardiology, Round[place], MD as Rounding Team (Cardiology)  Indicate any recent Medical Services you may have received from other than [facility] providers in the past year (date may be approximate).     Assessment:   This is a routine wellness examination for Gianna.  Hearing/Vision screen Hearing Screening - Comments:: Denies hearing difficulties   Vision Screening - Comments:: Wears reading glasses - up to date with routine eye exams with  Dr Herbert Deaner  Dietary issues and exercise activities discussed: Exercise limited by: None identified   Goals Addressed             This Visit's Progress    Increase physical activity         Depression Screen    07/26/2022    9:28 AM 07/15/2022   12:39 PM 07/04/2022    1:21 PM 02/22/2022    1:37 PM  11/02/2021    1:41 PM 09/24/2021    4:00 PM 09/12/2021   11:37 AM  PHQ 2/9 Scores  PHQ - 2 Score 0 0 0 0 0 0 0  PHQ- 9 Score   1 0 2  3    Fall Risk    07/26/2022    9:45 AM 07/24/2022    2:41 PM 07/22/2022    2:27 PM 07/21/2022    8:23 PM 07/19/2022    3:19 PM  [place] in the past year? 0 0 0 0 0  Number falls in past yr: 0 0 1 0 1  Injury with Fall? 0 0 0 0 0  Risk for fall due to : No Fall Risks Impaired balance/gait;Impaired mobility;Orthopedic patient Impaired balance/gait;Impaired mobility;Orthopedic patient  Impaired balance/gait;Orthopedic patient;Impaired mobility  Follow up Falls prevention discussed Falls evaluation completed Falls evaluation completed  Falls evaluation completed    FALL RISK PREVENTION PERTAINING TO THE  HOME:  Any stairs in or around the home? No  If so, are there any without handrails? No  Home free of loose throw rugs in walkways, pet beds, electrical cords, etc? Yes  Adequate lighting in your home to reduce risk of falls? Yes   ASSISTIVE DEVICES UTILIZED TO PREVENT FALLS:  Life alert? No  Use of a cane, walker or w/c? No  Grab bars in the bathroom? No  Shower chair or bench in shower? No  Elevated toilet seat or a handicapped toilet? No   TIMED UP AND GO:  Was the test performed? Yes .  Length of time to ambulate 10 feet: 10 sec.   Gait steady and fast without use of assistive device  Cognitive Function:        07/26/2022    9:45 AM 07/19/2021    1:35 PM  6CIT Screen  What Year? 0 points 0 points  What month? 0 points 0 points  What time? 0 points 0 points  Count back from 20 0 points 0 points  Months in reverse 0 points 0 points  Repeat phrase 0 points 0 points  Total Score 0 points 0 points    Immunizations Immunization History  Administered Date(s) Administered   Influenza Split 02/01/2012   Influenza Whole 02/25/2011   Influenza,inj,Quad PF,6+ Mos 03/29/2013, 03/04/2017, 04/13/2018   Influenza-Unspecified 02/01/2014, 01/14/2015, 02/20/2020, 01/11/2022   PFIZER(Purple Top)SARS-COV-2 Vaccination 06/18/2019, 07/13/2019, 02/22/2020, 08/10/2020   Pfizer Covid-19 Vaccine Bivalent Booster 5y-11y 01/11/2022   Pneumococcal Conjugate-13 02/06/2019   Pneumococcal Polysaccharide-23 04/19/2020   Respiratory Syncytial Virus Vaccine,Recomb Aduvanted(Arexvy) 01/11/2022   Tdap 09/12/2011   Zoster Recombinat (Shingrix) 08/13/2016, 10/26/2016   Zoster, Live 12/04/2013    TDAP status: Due, Education has been provided regarding the importance of this vaccine. Advised may receive this vaccine at local pharmacy or Health Dept. Aware to provide a copy of the vaccination record if obtained from local pharmacy or Health Dept. Verbalized acceptance and understanding.  Flu Vaccine  status: Up to date  Pneumococcal vaccine status: Up to date  Covid-19 vaccine status: Completed vaccines  Qualifies for Shingles Vaccine? Yes   Zostavax completed Yes   Shingrix Completed?: Yes  Screening Tests Health Maintenance  Topic Date Due   DTaP/Tdap/Td (2 - Td or Tdap) 09/11/2021   COVID-19 Vaccine (6 - 2023-24 season) 08/11/2022 (Originally 03/08/2022)   COLONOSCOPY (Pts 45-10yrs Insurance coverage will need to be confirmed)  04/25/2023   Medicare Annual Wellness (AWV)  07/26/2023   Pneumonia Vaccine 70+ Years old  Completed  INFLUENZA VACCINE  Completed   Hepatitis C Screening  Completed   Zoster Vaccines- Shingrix  Completed   HPV VACCINES  Aged Out   Lung Cancer Screening  Discontinued    Health Maintenance  Health Maintenance Due  Topic Date Due   DTaP/Tdap/Td (2 - Td or Tdap) 09/11/2021    Colorectal cancer screening: Type of screening: Colonoscopy. Completed 04/24/20. Repeat every 3 years  Lung Cancer Screening: (Low Dose CT Chest recommended if Age 31-80 years, 30 pack-year currently smoking OR have quit w/in 15years.) does qualify.   Lung Cancer Screening Referral: Deferred  Additional Screening:  Hepatitis C Screening: does qualify; Completed 12/01/20  Vision Screening: Recommended annual ophthalmology exams for early detection of glaucoma and other disorders of the eye. Is the patient up to date with their annual eye exam?  Yes  Who is the provider or what is the name of the office in which the patient attends annual eye exams? Dr Herbert Deaner If pt is not established with a provider, would they like to be referred to a provider to establish care? No .   Dental Screening: Recommended annual dental exams for proper oral hygiene  Community Resource Referral / Chronic Care Management:  CRR required this visit?  No   CCM required this visit?  No      Plan:     I have personally reviewed and noted the following in the patient's chart:   Medical  and social history Use of alcohol, tobacco or illicit drugs  Current medications and supplements including opioid prescriptions. Patient is currently taking opioid prescriptions. Information provided to patient regarding non-opioid alternatives. Patient advised to discuss non-opioid treatment plan with their provider. Functional ability and status Nutritional status Physical activity Advanced directives List of other physicians Hospitalizations, surgeries, and ER visits in previous 12 months Vitals Screenings to include cognitive, depression, and falls Referrals and appointments  In addition, I have reviewed and discussed with patient certain preventive protocols, quality metrics, and best practice recommendations. A written personalized care plan for preventive services as well as general preventive health recommendations were provided to patient.     Criselda Peaches, LPN   [id number]   Nurse Notes: None

## 2022-07-26 NOTE — Patient Instructions (Addendum)
[name] , Thank you for taking time to come for your Medicare Wellness Visit. I appreciate your ongoing commitment to your health goals. Please review the following plan we discussed and let me know if I can assist you in the future.   These are the goals we discussed:  Goals       Increase physical activity      Patient Stated (pt-stated)      I will continue to walk 5 days a week for 40 minutes .        This is a list of the screening recommended for you and due dates:  Health Maintenance  Topic Date Due   DTaP/Tdap/Td vaccine (2 - Td or Tdap) 09/11/2021   COVID-19 Vaccine (6 - 2023-24 season) 08/11/2022*   Colon Cancer Screening  04/25/2023   Medicare Annual Wellness Visit  07/26/2023   Pneumonia Vaccine  Completed   Flu Shot  Completed   Hepatitis C Screening: USPSTF Recommendation to screen - Ages 18-79 yo.  Completed   Zoster (Shingles) Vaccine  Completed   HPV Vaccine  Aged Out   Screening for Lung Cancer  Discontinued  *Topic was postponed. The date shown is not the original due date.   Opioid Pain Medicine Management Opioids are powerful medicines that are used to treat moderate to severe pain. When used for short periods of time, they can help you to: Sleep better. Do better in physical or occupational therapy. Feel better in the first few days after an injury. Recover from surgery. Opioids should be taken with the supervision of a trained health care provider. They should be taken for the shortest period of time possible. This is because opioids can be addictive, and the longer you take opioids, the greater your risk of addiction. This addiction can also be called opioid use disorder. What are the risks? Using opioid pain medicines for longer than 3 days increases your risk of side effects. Side effects include: Constipation. Nausea and vomiting. Breathing difficulties (respiratory depression). Drowsiness. Confusion. Opioid use disorder. Itching. Taking  opioid pain medicine for a long period of time can affect your ability to do daily tasks. It also puts you at risk for: Motor vehicle crashes. Depression. Suicide. Heart attack. Overdose, which can be life-threatening. What is a pain treatment plan? A pain treatment plan is an agreement between you and your health care provider. Pain is unique to each person, and treatments vary depending on your condition. To manage your pain, you and your health care provider need to work together. To help you do this: Discuss the goals of your treatment, including how much pain you might expect to have and how you will manage the pain. Review the risks and benefits of taking opioid medicines. Remember that a good treatment plan uses more than one approach and minimizes the chance of side effects. Be honest about the amount of medicines you take and about any drug or alcohol use. Get pain medicine prescriptions from only one health care provider. Pain can be managed with many types of alternative treatments. Ask your health care provider to refer you to one or more specialists who can help you manage pain through: Physical or occupational therapy. Counseling (cognitive behavioral therapy). Good nutrition. Biofeedback. Massage. Meditation. Non-opioid medicine. Following a gentle exercise program. How to use opioid pain medicine Taking medicine Take your pain medicine exactly as told by your health care provider. Take it only when you need it. If your pain gets less severe,  you may take less than your prescribed dose if your health care provider approves. If you are not having pain, do nottake pain medicine unless your health care provider tells you to take it. If your pain is severe, do nottry to treat it yourself by taking more pills than instructed on your prescription. Contact your health care provider for help. Write down the times when you take your pain medicine. It is easy to become confused while  on pain medicine. Writing the time can help you avoid overdose. Take other over-the-counter or prescription medicines only as told by your health care provider. Keeping yourself and others safe  While you are taking opioid pain medicine: Do not drive, use machinery, or power tools. Do not sign legal documents. Do not drink alcohol. Do not take sleeping pills. Do not supervise children by yourself. Do not do activities that require climbing or being in high places. Do not go to a lake, river, ocean, spa, or swimming pool. Do not share your pain medicine with anyone. Keep pain medicine in a locked cabinet or in a secure area where pets and children cannot reach it. Stopping your use of opioids If you have been taking opioid medicine for more than a few weeks, you may need to slowly decrease (taper) how much you take until you stop completely. Tapering your use of opioids can decrease your risk of symptoms of withdrawal, such as: Pain and cramping in the abdomen. Nausea. Sweating. Sleepiness. Restlessness. Uncontrollable shaking (tremors). Cravings for the medicine. Do not attempt to taper your use of opioids on your own. Talk with your health care provider about how to do this. Your health care provider may prescribe a step-down schedule based on how much medicine you are taking and how long you have been taking it. Getting rid of leftover pills Do not save any leftover pills. Get rid of leftover pills safely by: Taking the medicine to a prescription take-back program. This is usually offered by the county or law enforcement. Bringing them to a pharmacy that has a drug disposal container. Flushing them down the toilet. Check the label or package insert of your medicine to see whether this is safe to do. Throwing them out in the trash. Check the label or package insert of your medicine to see whether this is safe to do. If it is safe to throw it out, remove the medicine from the original  container, put it into a sealable bag or container, and mix it with used coffee grounds, food scraps, dirt, or cat litter before putting it in the trash. Follow these instructions at home: Activity Do exercises as told by your health care provider. Avoid activities that make your pain worse. Return to your normal activities as told by your health care provider. Ask your health care provider what activities are safe for you. General instructions You may need to take these actions to prevent or treat constipation: Drink enough fluid to keep your urine pale yellow. Take over-the-counter or prescription medicines. Eat foods that are high in fiber, such as beans, whole grains, and fresh fruits and vegetables. Limit foods that are high in fat and processed sugars, such as fried or sweet foods. Keep all follow-up visits. This is important. Where to find support If you have been taking opioids for a long time, you may benefit from receiving support for quitting from a local support group or counselor. Ask your health care provider for a referral to these resources in your area.  Where to find more information Centers for Disease Control and Prevention (CDC): http://www.wolf.info/ [place] Food and Drug Administration (FDA): [place] Get help right away if: You may have taken too much of an opioid (overdosed). Common symptoms of an overdose: Your breathing is slower or more shallow than normal. You have a very slow heartbeat (pulse). You have slurred speech. You have nausea and vomiting. Your pupils become very small. You have other potential symptoms: You are very confused. You faint or feel like you will faint. You have cold, clammy skin. You have blue lips or fingernails. You have thoughts of harming yourself or harming others. These symptoms may represent a serious problem that is an emergency. Do not wait to see if the symptoms will go away. Get medical help right away. Call your local emergency  services (911 in the [place] Do not drive yourself to the hospital.  If you ever feel like you may hurt yourself or others, or have thoughts about taking your own life, get help right away. Go to your nearest emergency department or: Call your local emergency services (911 in the [place] Call the [facility] 401-300-5194 in the [place] Call a suicide crisis helpline, such as the [facility] at (236) 045-4739 or 988 in the [place]. This is open 24 hours a day in the [place] Text the Crisis Text Line at (604)064-0884 (in the [place].). Summary Opioid medicines can help you manage moderate to severe pain for a short period of time. A pain treatment plan is an agreement between you and your health care provider. Discuss the goals of your treatment, including how much pain you might expect to have and how you will manage the pain. If you think that you or someone else may have taken too much of an opioid, get medical help right away. This information is not intended to replace advice given to you by your health care provider. Make sure you discuss any questions you have with your health care provider. Document Revised: 11/22/2020 Document Reviewed: 08/09/2020 Elsevier Patient Education  [employer] directives: In Chart  Conditions/risks identified: None  Next appointment: Follow up in one year for your annual wellness visit.   Preventive Care 44 Years and Older, Male  Preventive care refers to lifestyle choices and visits with your health care provider that can promote health and wellness. What does preventive care include? A yearly physical exam. This is also called an annual well check. Dental exams once or twice a year. Routine eye exams. Ask your health care provider how often you should have your eyes checked. Personal lifestyle choices, including: Daily care of your teeth and gums. Regular physical activity. Eating a healthy diet. Avoiding  tobacco and drug use. Limiting alcohol use. Practicing safe sex. Taking low doses of aspirin every day. Taking vitamin and mineral supplements as recommended by your health care provider. What happens during an annual well check? The services and screenings done by your health care provider during your annual well check will depend on your age, overall health, lifestyle risk factors, and family history of disease. Counseling  Your health care provider may ask you questions about your: Alcohol use. Tobacco use. Drug use. Emotional well-being. Home and relationship well-being. Sexual activity. Eating habits. History of falls. Memory and ability to understand (cognition). Work and work Statistician. Screening  You may have the following tests or measurements: Height, weight, and BMI. Blood pressure. Lipid and cholesterol levels. These may be checked every 5  years, or more frequently if you are over 34 years old. Skin check. Lung cancer screening. You may have this screening every year starting at age 33 if you have a 30-pack-year history of smoking and currently smoke or have quit within the past 15 years. Fecal occult blood test (FOBT) of the stool. You may have this test every year starting at age 24. Flexible sigmoidoscopy or colonoscopy. You may have a sigmoidoscopy every 5 years or a colonoscopy every 10 years starting at age 50. Prostate cancer screening. Recommendations will vary depending on your family history and other risks. Hepatitis C blood test. Hepatitis B blood test. Sexually transmitted disease (STD) testing. Diabetes screening. This is done by checking your blood sugar (glucose) after you have not eaten for a while (fasting). You may have this done every 1-3 years. Abdominal aortic aneurysm (AAA) screening. You may need this if you are a current or former smoker. Osteoporosis. You may be screened starting at age 24 if you are at high risk. Talk with your health care  provider about your test results, treatment options, and if necessary, the need for more tests. Vaccines  Your health care provider may recommend certain vaccines, such as: Influenza vaccine. This is recommended every year. Tetanus, diphtheria, and acellular pertussis (Tdap, Td) vaccine. You may need a Td booster every 10 years. Zoster vaccine. You may need this after age 59. Pneumococcal 13-valent conjugate (PCV13) vaccine. One dose is recommended after age 70. Pneumococcal polysaccharide (PPSV23) vaccine. One dose is recommended after age 27. Talk to your health care provider about which screenings and vaccines you need and how often you need them. This information is not intended to replace advice given to you by your health care provider. Make sure you discuss any questions you have with your health care provider. Document Released: 05/26/2015 Document Revised: 01/17/2016 Document Reviewed: 02/28/2015 Elsevier Interactive Patient Education  2017 [place] Prevention in the Home Falls can cause injuries. They can happen to people of all ages. There are many things you can do to make your home safe and to help prevent falls. What can I do on the outside of my home? Regularly fix the edges of walkways and driveways and fix any cracks. Remove anything that might make you trip as you walk through a door, such as a raised step or threshold. Trim any bushes or trees on the path to your home. Use bright outdoor lighting. Clear any walking paths of anything that might make someone trip, such as rocks or tools. Regularly check to see if handrails are loose or broken. Make sure that both sides of any steps have handrails. Any raised decks and porches should have guardrails on the edges. Have any leaves, snow, or ice cleared regularly. Use sand or salt on walking paths during winter. Clean up any spills in your garage right away. This includes oil or grease spills. What can I do in the  bathroom? Use night lights. Install grab bars by the toilet and in the tub and shower. Do not use towel bars as grab bars. Use non-skid mats or decals in the tub or shower. If you need to sit down in the shower, use a plastic, non-slip stool. Keep the floor dry. Clean up any water that spills on the floor as soon as it happens. Remove soap buildup in the tub or shower regularly. Attach bath mats securely with double-sided non-slip rug tape. Do not have throw rugs and other things on the  floor that can make you trip. What can I do in the bedroom? Use night lights. Make sure that you have a light by your bed that is easy to reach. Do not use any sheets or blankets that are too big for your bed. They should not hang down onto the floor. Have a firm chair that has side arms. You can use this for support while you get dressed. Do not have throw rugs and other things on the floor that can make you trip. What can I do in the kitchen? Clean up any spills right away. Avoid walking on wet floors. Keep items that you use a lot in easy-to-reach places. If you need to reach something above you, use a strong step stool that has a grab bar. Keep electrical cords out of the way. Do not use floor polish or wax that makes floors slippery. If you must use wax, use non-skid floor wax. Do not have throw rugs and other things on the floor that can make you trip. What can I do with my stairs? Do not leave any items on the stairs. Make sure that there are handrails on both sides of the stairs and use them. Fix handrails that are broken or loose. Make sure that handrails are as long as the stairways. Check any carpeting to make sure that it is firmly attached to the stairs. Fix any carpet that is loose or worn. Avoid having throw rugs at the top or bottom of the stairs. If you do have throw rugs, attach them to the floor with carpet tape. Make sure that you have a light switch at the top of the stairs and the  bottom of the stairs. If you do not have them, ask someone to add them for you. What else can I do to help prevent falls? Wear shoes that: Do not have high heels. Have rubber bottoms. Are comfortable and fit you well. Are closed at the toe. Do not wear sandals. If you use a stepladder: Make sure that it is fully opened. Do not climb a closed stepladder. Make sure that both sides of the stepladder are locked into place. Ask someone to hold it for you, if possible. Clearly mark and make sure that you can see: Any grab bars or handrails. First and last steps. Where the edge of each step is. Use tools that help you move around (mobility aids) if they are needed. These include: Canes. Walkers. Scooters. Crutches. Turn on the lights when you go into a dark area. Replace any light bulbs as soon as they burn out. Set up your furniture so you have a clear path. Avoid moving your furniture around. If any of your floors are uneven, fix them. If there are any pets around you, be aware of where they are. Review your medicines with your doctor. Some medicines can make you feel dizzy. This can increase your chance of falling. Ask your doctor what other things that you can do to help prevent falls. This information is not intended to replace advice given to you by your health care provider. Make sure you discuss any questions you have with your health care provider. Document Released: 02/23/2009 Document Revised: 10/05/2015 Document Reviewed: 06/03/2014 Elsevier Interactive Patient Education  2017 [employer].

## 2022-07-28 ENCOUNTER — Ambulatory Visit
Admission: RE | Admit: 2022-07-28 | Discharge: 2022-07-28 | Disposition: A | Discharge status: Home / Self Care | Payer: Medicare Other | Source: Ambulatory Visit | Attending: Urgent Care | Admitting: Urgent Care

## 2022-07-28 VITALS — BP 103/72 | HR 92 | Temp 98.2°F | Resp 18 | Ht 72.0 in | Wt 210.0 lb

## 2022-07-28 DIAGNOSIS — K047 Periapical abscess without sinus: Secondary | ICD-10-CM | POA: Diagnosis not present

## 2022-07-28 MED ORDER — CLINDAMYCIN HCL 300 MG PO CAPS: 300.0000 mg | ORAL_CAPSULE | Freq: Three times a day (TID) | ORAL | 0 refills | Status: DC

## 2022-07-28 NOTE — ED Triage Notes (Signed)
X2 days Pt states that he has some left jaw swelling. Pt states that he has a lesion on the inside of his mouth that he believes is causing the swelling.

## 2022-07-28 NOTE — ED Provider Notes (Signed)
[facility]  Note:  This document was prepared using Systems analyst and may include unintentional dictation errors.  MRN: [id number] DOB: [birth year]  Subjective:   Brandon [name] is a 66 y.o. male presenting for 2-day history of left-sided lower jaw/gum discomfort.  Patient felt like it was a canker sore and started to apply compresses.  Overnight he developed significant swelling.  Has minimal tenderness.  No fever, drainage of pus or bleeding.  He does have a dental specialist he can follow-up with.  Patient is on Coumadin.  No current facility-administered medications for this encounter.  Current Outpatient Medications:    acetaminophen (TYLENOL) 500 MG tablet, Take 500-1,000 mg by mouth every 6 (six) hours as needed (for pain)., Disp: , Rfl:    aspirin EC 81 MG tablet, Take 1 tablet (81 mg total) by mouth daily. Swallow whole., Disp: 90 tablet, Rfl: 1   atorvastatin (LIPITOR) 80 MG tablet, TAKE 1 TABLET BY MOUTH ONCE DAILY AT 6PM. (Patient taking differently: Take 80 mg by mouth at bedtime.), Disp: 90 tablet, Rfl: 3   Carboxymethylcellulose Sodium (ARTIFICIAL TEARS OP), Place 1 drop into both eyes daily as needed (for dryness or irritation)., Disp: , Rfl:    eplerenone (INSPRA) 50 MG tablet, Take 1 tablet (50 mg total) by mouth in the morning., Disp: 30 tablet, Rfl: 6   ezetimibe (ZETIA) 10 MG tablet, TAKE ONE TABLET BY MOUTH DAILY, Disp: 90 tablet, Rfl: 3   gabapentin (NEURONTIN) 300 MG capsule, Take 1 capsule (300 mg total) by mouth 3 (three) times daily., Disp: 90 capsule, Rfl: 3   losartan (COZAAR) 25 MG tablet, Take 0.5 tablets (12.5 mg total) by mouth daily., Disp: 30 tablet, Rfl: 5   metoprolol succinate (TOPROL-XL) 25 MG 24 hr tablet, Take 0.5 tablets (12.5 mg total) by mouth daily., Disp: 45 tablet, Rfl: 1   pantoprazole (PROTONIX) 40 MG tablet, Take 1 tablet (40 mg total) by mouth 2 (two) times daily., Disp: 60 tablet, Rfl: 3    potassium chloride SA (KLOR-CON M) 20 MEQ tablet, Take 1 tablet (20 mEq total) by mouth daily., Disp: 90 tablet, Rfl: 3   sertraline (ZOLOFT) 50 MG tablet, TAKE 1/2 TAB BY MOUTH DAILY FOR 1 WEEK, THEN INCREASE TO 1 TABLET DAILY., Disp: 90 tablet, Rfl: 3   tamsulosin (FLOMAX) 0.4 MG CAPS capsule, Take 1 capsule (0.4 mg total) by mouth daily., Disp: 90 capsule, Rfl: 3   traMADol (ULTRAM) 50 MG tablet, TAKE 1-2 TABLETS BY MOUTH TWICE A DAY FOR 30 DAYS, Disp: 120 tablet, Rfl: 1   ZYRTEC ALLERGY 10 MG tablet, Take 10 mg by mouth daily., Disp: , Rfl:    melatonin 5 MG TABS, Take 1 tablet (5 mg total) by mouth at bedtime. (Patient not taking: Reported on 07/15/2022), Disp: 30 tablet, Rfl: 6   ondansetron (ZOFRAN) 4 MG tablet, Take 1 tablet (4 mg total) by mouth every 8 (eight) hours as needed for nausea or vomiting. (Patient not taking: Reported on 07/15/2022), Disp: 20 tablet, Rfl: 3   torsemide (DEMADEX) 20 MG tablet, Take 1 tablet (20 mg total) by mouth as needed. (Patient not taking: Reported on 07/15/2022), Disp: 30 tablet, Rfl: 5   warfarin (COUMADIN) 2.5 MG tablet, Take 7.5 mg (3 tablets) by mouth daily or as instructed by LVAD clinic. (Patient taking differently: Take 2.5 mg by mouth daily at 4 PM. Take 5 mg (2 tablets) on Monday, and 7.5 mg (3 tablets) by mouth daily  all other days, or as instructed by LVAD clinic.), Disp: 270 tablet, Rfl: 1   No Known Allergies  Past Medical History:  Diagnosis Date   AICD (automatic cardioverter/defibrillator) present 08/31/2019   Ankylosing spondylitis ([place])    Arthritis    BENIGN PROSTATIC HYPERTROPHY 06/07/2008   CHF (congestive heart failure) ([place])    COLONIC POLYPS, HX OF 06/07/2008   Coronary artery disease    Depression    H/O hiatal hernia    Heart failure ([place])    HYPERLIPIDEMIA 06/07/2008   HYPERTENSION 06/07/2008   Myocardial infarction [facility]) 2005   NSTEMI, s/p LAD stent   NEPHROLITHIASIS, HX OF 06/07/2008   STEMI (ST elevation myocardial  infarction) ([place]) 04/27/2019     Past Surgical History:  Procedure Laterality Date   AORTIC VALVE REPLACEMENT N/A 04/06/2021   Procedure: AORTIC VALVE REPLACEMENT (AVR) WITH INSPIRIS RESILIA  AORTIC VALVE SIZE 25MM;  Surgeon: Gaye Pollack, MD;  Location: [place];  Service: Open Heart Surgery;  Laterality: N/A;   COLONOSCOPY WITH PROPOFOL N/A 04/24/2020   Procedure: COLONOSCOPY WITH PROPOFOL;  Surgeon: [name], MD;  Location: [facility];  Service: Gastroenterology;  Laterality: N/A;   CORONARY ANGIOPLASTY WITH STENT PLACEMENT  2005   LAD stent, jailed diagonal   CORONARY/GRAFT ACUTE MI REVASCULARIZATION N/A 04/27/2019   Procedure: Coronary/Graft Acute MI Revascularization;  Surgeon: [name], MD;  Location: [place] [facility];  Service: Cardiovascular;  Laterality: N/A;   ICD IMPLANT  08/31/2019   ICD IMPLANT N/A 08/31/2019   Procedure: ICD IMPLANT;  Surgeon: [name], MD;  Location: [place] [facility];  Service: Cardiovascular;  Laterality: N/A;   ICD LEAD REMOVAL N/A 12/04/2020   Procedure: ICD SYSTEM EXTRACTION;  Surgeon: Vickie Epley, MD;  Location: [place];  Service: Cardiovascular;  Laterality: N/A;   INSERTION OF IMPLANTABLE LEFT VENTRICULAR ASSIST DEVICE N/A 04/06/2021   Procedure: INSERTION OF IMPLANTABLE LEFT VENTRICULAR ASSIST DEVICE;  Surgeon: Gaye Pollack, MD;  Location: [place];  Service: Open Heart Surgery;  Laterality: N/A;  HM3   IR THORACENTESIS ASP PLEURAL SPACE W/IMG GUIDE  04/19/2021   IR THORACENTESIS ASP PLEURAL SPACE W/IMG GUIDE  04/20/2021   IR THORACENTESIS ASP PLEURAL SPACE W/IMG GUIDE  04/23/2021   LAMINECTOMY     lumbar   LEFT HEART CATH AND CORONARY ANGIOGRAPHY N/A 04/27/2019   Procedure: LEFT HEART CATH AND CORONARY ANGIOGRAPHY;  Surgeon: [name], MD;  Location: [place] [facility];  Service: Cardiovascular;  Laterality: N/A;   LUMBAR FUSION  01/2012   POLYPECTOMY  04/24/2020   Procedure: POLYPECTOMY;  Surgeon:  [name], MD;  Location: [facility];  Service: Gastroenterology;;   RIGHT HEART CATH N/A 04/27/2019   Procedure: RIGHT HEART CATH;  Surgeon: [name], Kayode M, MD;  Location: [place] [facility];  Service: Cardiovascular;  Laterality: N/A;   RIGHT HEART CATH N/A 03/28/2021   Procedure: RIGHT HEART CATH;  Surgeon: Jolaine Artist, MD;  Location: [place] [facility];  Service: Cardiovascular;  Laterality: N/A;   RIGHT HEART CATH AND CORONARY ANGIOGRAPHY N/A 05/06/2022   Procedure: RIGHT HEART CATH AND CORONARY ANGIOGRAPHY;  Surgeon: Jolaine Artist, MD;  Location: [place] [facility];  Service: Cardiovascular;  Laterality: N/A;   RIGHT/LEFT HEART CATH AND CORONARY ANGIOGRAPHY N/A 11/29/2020   Procedure: RIGHT/LEFT HEART CATH AND CORONARY ANGIOGRAPHY;  Surgeon: Jolaine Artist, MD;  Location: [place] [facility];  Service: Cardiovascular;  Laterality: N/A;   TEE  WITHOUT CARDIOVERSION N/A 12/04/2020   Procedure: TRANSESOPHAGEAL ECHOCARDIOGRAM (TEE);  Surgeon: Vickie Epley, MD;  Location: [place];  Service: Cardiovascular;  Laterality: N/A;   TEE WITHOUT CARDIOVERSION N/A 04/06/2021   Procedure: TRANSESOPHAGEAL ECHOCARDIOGRAM (TEE);  Surgeon: Gaye Pollack, MD;  Location: [place];  Service: Open Heart Surgery;  Laterality: N/A;   TONSILLECTOMY     warthin tumor removal Left 2014    Family History  Problem Relation Age of Onset   Depression Father    Arthritis Sister        RA   Heart failure Mother    Other Neg Hx    Colon cancer Neg Hx    Esophageal cancer Neg Hx    Rectal cancer Neg Hx    Stomach cancer Neg Hx    Colon polyps Neg Hx     Social History   Tobacco Use   Smoking status: Former    Packs/day: 1.00    Years: 40.00    Additional pack years: 0.00    Total pack years: 40.00    Types: Cigarettes    Quit date: 07/26/2019    Years since quitting: 3.0   Smokeless tobacco: Never   Tobacco comments:    1-1.5 pks per year x 40-45 yrs  Vaping Use    Vaping Use: Some days   Substances: CBD  Substance Use Topics   Alcohol use: Yes    Alcohol/week: 7.0 standard drinks of alcohol    Types: 7 Shots of liquor per week    Comment: "2 vodka tonics a night"   Drug use: Yes    Comment: occasionally - CBD oil    ROS   Objective:   Vitals: BP 103/72 (BP Location: Right Arm)   Pulse 92   Temp 98.2 F (36.8 C)   Resp 18   Ht 6' (1.829 m)   Wt 210 lb (95.3 kg)   SpO2 99%   BMI 28.48 kg/m   Physical Exam Constitutional:      General: He is not in acute distress.    Appearance: Normal appearance. He is well-developed and normal weight. He is not ill-appearing, toxic-appearing or diaphoretic.  HENT:     Head: Normocephalic and atraumatic.      Right Ear: External ear normal.     Left Ear: External ear normal.     Nose: Nose normal.     Mouth/Throat:     Pharynx: Oropharynx is clear.      Comments: Obvious gingival recession throughout.  Area outlined indicates area of gingival erythema and swelling. Eyes:     General: No scleral icterus.       Right eye: No discharge.        Left eye: No discharge.     Extraocular Movements: Extraocular movements intact.  Cardiovascular:     Rate and Rhythm: Normal rate.  Pulmonary:     Effort: Pulmonary effort is normal.  Musculoskeletal:     Cervical back: Normal range of motion.  Neurological:     Mental Status: He is alert and oriented to person, place, and time.  Psychiatric:        Mood and Affect: Mood normal.        Behavior: Behavior normal.        Thought Content: Thought content normal.        Judgment: Judgment normal.     Assessment and Plan :   PDMP not reviewed this encounter.  1. Dental infection  Recommended using clindamycin for suspected dental infection/abscess.  This is a better option given interactions with Coumadin and amoxicillin.  Emphasized need for follow-up with his dental specialist soon as possible tomorrow. Counseled patient on potential for  adverse effects with medications prescribed/recommended today, ER and return-to-clinic precautions discussed, patient verbalized understanding.    Jaynee Eagles, [place] 07/28/22 1413

## 2022-07-28 NOTE — Discharge Instructions (Signed)
Please make sure you call your dental specialist tomorrow. Otherwise, start the antibiotic, clindamycin, today to help with a suspected dental infection/abscess.

## 2022-07-29 ENCOUNTER — Ambulatory Visit (HOSPITAL_COMMUNITY): Payer: Medicare Other

## 2022-07-29 ENCOUNTER — Encounter (HOSPITAL_COMMUNITY)
Admission: RE | Admit: 2022-07-29 | Discharge: 2022-07-29 | Disposition: A | Discharge status: Home / Self Care | Payer: Medicare Other | Source: Ambulatory Visit | Attending: Internal Medicine | Admitting: Internal Medicine

## 2022-07-29 DIAGNOSIS — I2102 ST elevation (STEMI) myocardial infarction involving left anterior descending coronary artery: Secondary | ICD-10-CM | POA: Diagnosis not present

## 2022-07-31 ENCOUNTER — Encounter (HOSPITAL_COMMUNITY)
Admission: RE | Admit: 2022-07-31 | Discharge: 2022-07-31 | Disposition: A | Discharge status: Home / Self Care | Payer: Medicare Other | Source: Ambulatory Visit | Attending: Internal Medicine | Admitting: Internal Medicine

## 2022-07-31 ENCOUNTER — Ambulatory Visit (HOSPITAL_COMMUNITY): Payer: Medicare Other

## 2022-07-31 DIAGNOSIS — I2102 ST elevation (STEMI) myocardial infarction involving left anterior descending coronary artery: Secondary | ICD-10-CM | POA: Diagnosis not present

## 2022-07-31 NOTE — Progress Notes (Signed)
Tha reports that he sometimes feels lightheaded when he gets out of his chair to stand up at home. Cequan says this has been going on the past 3-4 days Denies symptoms today. Map and HR WNL. The LVAD clinic was called and notified. I spoke with [place]. Patient instructed to make sure the he is drinking enough water.Will continue to monitor the patient throughout  the program. Ramil left cardiac rehab without complaints.Harrell Gave RN BSN

## 2022-07-31 NOTE — Progress Notes (Signed)
Cardiac Individual Treatment Plan  Patient Details  Name: Brandon [name] MRN: [id number] Date of Birth: 1955/10/31 Referring Provider:   [place] from 07/04/2022 in [facility]  Referring Provider Dr. [name]       Initial Encounter Date:  Flowsheet Row INTENSIVE CARDIAC REHAB ORIENT from 07/04/2022 in [facility] [place]  Date 07/04/22       Visit Diagnosis: 05/06/22 STEMI, Med [place], 04/22/22 VFIB  Patient's Home Medications on Admission:  Current Outpatient Medications:    acetaminophen (TYLENOL) 500 MG tablet, Take 500-1,000 mg by mouth every 6 (six) hours as needed (for pain)., Disp: , Rfl:    aspirin EC 81 MG tablet, Take 1 tablet (81 mg total) by mouth daily. Swallow whole., Disp: 90 tablet, Rfl: 1   atorvastatin (LIPITOR) 80 MG tablet, TAKE 1 TABLET BY MOUTH ONCE DAILY AT 6PM. (Patient taking differently: Take 80 mg by mouth at bedtime.), Disp: 90 tablet, Rfl: 3   Carboxymethylcellulose Sodium (ARTIFICIAL TEARS OP), Place 1 drop into both eyes daily as needed (for dryness or irritation)., Disp: , Rfl:    clindamycin (CLEOCIN) 300 MG capsule, Take 1 capsule (300 mg total) by mouth 3 (three) times daily., Disp: 30 capsule, Rfl: 0   eplerenone (INSPRA) 50 MG tablet, Take 1 tablet (50 mg total) by mouth in the morning., Disp: 30 tablet, Rfl: 6   ezetimibe (ZETIA) 10 MG tablet, TAKE ONE TABLET BY MOUTH DAILY, Disp: 90 tablet, Rfl: 3   gabapentin (NEURONTIN) 300 MG capsule, Take 1 capsule (300 mg total) by mouth 3 (three) times daily., Disp: 90 capsule, Rfl: 3   losartan (COZAAR) 25 MG tablet, Take 0.5 tablets (12.5 mg total) by mouth daily., Disp: 30 tablet, Rfl: 5   melatonin 5 MG TABS, Take 1 tablet (5 mg total) by mouth at bedtime. (Patient not taking: Reported on 07/15/2022), Disp: 30 tablet, Rfl: 6   metoprolol succinate (TOPROL-XL) 25 MG  24 hr tablet, Take 0.5 tablets (12.5 mg total) by mouth daily., Disp: 45 tablet, Rfl: 1   ondansetron (ZOFRAN) 4 MG tablet, Take 1 tablet (4 mg total) by mouth every 8 (eight) hours as needed for nausea or vomiting. (Patient not taking: Reported on 07/15/2022), Disp: 20 tablet, Rfl: 3   pantoprazole (PROTONIX) 40 MG tablet, Take 1 tablet (40 mg total) by mouth 2 (two) times daily., Disp: 60 tablet, Rfl: 3   potassium chloride SA (KLOR-CON M) 20 MEQ tablet, Take 1 tablet (20 mEq total) by mouth daily., Disp: 90 tablet, Rfl: 3   sertraline (ZOLOFT) 50 MG tablet, TAKE 1/2 TAB BY MOUTH DAILY FOR 1 WEEK, THEN INCREASE TO 1 TABLET DAILY., Disp: 90 tablet, Rfl: 3   tamsulosin (FLOMAX) 0.4 MG CAPS capsule, Take 1 capsule (0.4 mg total) by mouth daily., Disp: 90 capsule, Rfl: 3   torsemide (DEMADEX) 20 MG tablet, Take 1 tablet (20 mg total) by mouth as needed. (Patient not taking: Reported on 07/15/2022), Disp: 30 tablet, Rfl: 5   traMADol (ULTRAM) 50 MG tablet, TAKE 1-2 TABLETS BY MOUTH TWICE A DAY FOR 30 DAYS, Disp: 120 tablet, Rfl: 1   warfarin (COUMADIN) 2.5 MG tablet, Take 7.5 mg (3 tablets) by mouth daily or as instructed by LVAD clinic. (Patient taking differently: Take 2.5 mg by mouth daily at 4 PM. Take 5 mg (2 tablets) on Monday, and 7.5 mg (3 tablets) by mouth  daily all other days, or as instructed by LVAD clinic.), Disp: 270 tablet, Rfl: 1   ZYRTEC ALLERGY 10 MG tablet, Take 10 mg by mouth daily., Disp: , Rfl:   Past Medical History: Past Medical History:  Diagnosis Date   AICD (automatic cardioverter/defibrillator) present 08/31/2019   Ankylosing spondylitis ([place])    Arthritis    BENIGN PROSTATIC HYPERTROPHY 06/07/2008   CHF (congestive heart failure) ([place])    COLONIC POLYPS, HX OF 06/07/2008   Coronary artery disease    Depression    H/O hiatal hernia    Heart failure ([place])    HYPERLIPIDEMIA 06/07/2008   HYPERTENSION 06/07/2008   Myocardial infarction [facility]) 2005   NSTEMI, s/p LAD stent    NEPHROLITHIASIS, HX OF 06/07/2008   STEMI (ST elevation myocardial infarction) ([place]) 04/27/2019    Tobacco Use: Social History   Tobacco Use  Smoking Status Former   Packs/day: 1.00   Years: 40.00   Additional pack years: 0.00   Total pack years: 40.00   Types: Cigarettes   Quit date: 07/26/2019   Years since quitting: 3.0  Smokeless Tobacco Never  Tobacco Comments   1-1.5 pks per year x 40-45 yrs    Labs: Review Flowsheet  More data exists      Latest Ref Rng & Units 04/28/2021 05/04/2021 05/06/2022 05/07/2022 05/08/2022  Labs for ITP Cardiac and Pulmonary Rehab  PH, Arterial 7.35 - 7.45 - - 7.371  7.378  - -  PCO2 arterial 32 - 48 mmHg - - 31.4  36.5  - -  Bicarbonate 20.0 - 28.0 mmol/L - - 24.8  18.2  21.5  - -  TCO2 22 - 32 mmol/L - - 26  19  23   - -  Acid-base deficit 0.0 - 2.0 mmol/L - - 1.0  6.0  3.0  - -  O2 Saturation % 36.8  51.2  60  92  91  71.7  75.6     Capillary Blood Glucose: Lab Results  Component Value Date   GLUCAP 117 (H) 06/12/2022   GLUCAP 122 (H) 05/06/2022   GLUCAP 100 (H) 05/09/2021   GLUCAP 84 05/09/2021   GLUCAP 122 (H) 05/08/2021     Exercise Target Goals: Exercise Program Goal: Individual exercise prescription set using results from initial 6 min walk test and THRR while considering  patient's activity barriers and safety.   Exercise Prescription Goal: Initial exercise prescription builds to 30-45 minutes a day of aerobic activity, 2-3 days per week.  Home exercise guidelines will be given to patient during program as part of exercise prescription that the participant will acknowledge.  Activity Barriers & Risk Stratification:  Activity Barriers & Cardiac Risk Stratification - 07/04/22 1332       Activity Barriers & Cardiac Risk Stratification   Activity Barriers Joint Problems;Deconditioning;Arthritis;Back Problems;Shortness of Breath;Neck/Spine Problems;Balance Concerns    Cardiac Risk Stratification High             6  Minute Walk:  6 Minute Walk     Row Name 07/04/22 1323         6 Minute Walk   Phase Initial     Distance 1040 feet     Walk Time 6 minutes     # of Rest Breaks 0     MPH 1.97     METS 2.38     RPE 12     Perceived Dyspnea  1     VO2 Peak 8.32     Symptoms Yes (  comment)     Comments bilateral hip pain 5/10 chronic. 5/10 back neck bain chronic. Relieved with rest. Rollator used midway through walk test due to unsteadiness     Resting HR 96 bpm     Resting BP 84/0  VAD-MAP     Resting Oxygen Saturation  97 %     Exercise Oxygen Saturation  during 6 min walk 95 %     Max Ex. HR 125 bpm     Max Ex. BP 86/0  VAD-MAP     2 Minute Post BP 80/0  VAD-MAP              Oxygen Initial Assessment:   Oxygen Re-Evaluation:   Oxygen Discharge (Final Oxygen Re-Evaluation):   Initial Exercise Prescription:  Initial Exercise Prescription - 07/04/22 1300       Date of Initial Exercise RX and Referring Provider   Date 07/04/22    Referring Provider Dr. [name]    Expected Discharge Date 09/13/22      Recumbant Bike   Level 1    RPM 65    Minutes 15    METs 1.8      NuStep   Level 1    SPM 85    Minutes 15    METs 1.8      Prescription Details   Frequency (times per week) 3    Duration Progress to 30 minutes of continuous aerobic without signs/symptoms of physical distress      Intensity   THRR 40-80% of Max Heartrate 61-122    Ratings of Perceived Exertion 11-13    Perceived Dyspnea 0-4      Progression   Progression Continue progressive overload as per policy without signs/symptoms or physical distress.      Resistance Training   Training Prescription Yes    Weight 3 lbs    Reps 10-15             Perform Capillary Blood Glucose checks as needed.  Exercise Prescription Changes:   Exercise Prescription Changes     Row Name 07/04/22 1300 07/10/22 1630 07/31/22 1637         Response to Exercise   Blood Pressure (Admit) 84/0 82/0  VAD-MAP 84/0   VAD-MAP     Blood Pressure (Exercise) -- 84/0 90/0     Blood Pressure (Exit) -- 84/0 84/0     Heart Rate (Admit) -- 118 bpm 86 bpm     Heart Rate (Exercise) -- 116 bpm 112 bpm     Heart Rate (Exit) -- 116 bpm 87 bpm     Rating of Perceived Exertion (Exercise) -- 11 11     Perceived Dyspnea (Exercise) -- 0 0     Symptoms -- Some ECG abnormalities, see nurse note Some ECG abnormalities, see nurse note     Comments -- Pt first day in the CRP2 program Reviewed MET's and goals     Duration -- Progress to 30 minutes of  aerobic without signs/symptoms of physical distress Progress to 30 minutes of  aerobic without signs/symptoms of physical distress     Intensity -- THRR unchanged THRR unchanged       Progression   Progression -- Continue to progress workloads to maintain intensity without signs/symptoms of physical distress. Continue to progress workloads to maintain intensity without signs/symptoms of physical distress.     Average METs -- 3.4 2.75       Resistance Training   Training Prescription -- No No  Recumbant Bike   Level -- 1 1     RPM -- 75 66     Watts -- 17 15     Minutes -- 15 15     METs -- 2.2 2       NuStep   Level -- 1 3     SPM -- 123 134     Minutes -- 15 15     METs -- 3.4 3.5              Exercise Comments:   Exercise Comments     Row Name 07/10/22 1638 07/31/22 1645         Exercise Comments Pt first day in the CRP2 program. Pt tolerated exercise well with an average MET level of 2.8. See nurse note about some ECG abnormalities. Pt did good on his first day and and is learning his THRR, RPE and ExRx Reviewed MET's and goals. Pt tolerated exercise well with an average MET level of 2.75. See nurse note about some ECG abnormalities. Pt is feeling good about his goals so far and feels like he is doing good with his diet and adding in more variety. Has still had some reaccuring SOB and lightheadness. But he has found he recovers more quickly. Nurse  aware. Will review home ExRx after evaluation of symptoms               Exercise Goals and Review:   Exercise Goals     Row Name 07/04/22 1347             Exercise Goals   Increase Physical Activity Yes       Intervention Provide advice, education, support and counseling about physical activity/exercise needs.;Develop an individualized exercise prescription for aerobic and resistive training based on initial evaluation findings, risk stratification, comorbidities and participant's personal goals.       Expected Outcomes Short Term: Attend rehab on a regular basis to increase amount of physical activity.;Long Term: Add in home exercise to make exercise part of routine and to increase amount of physical activity.;Long Term: Exercising regularly at least 3-5 days a week.       Able to understand and use rate of perceived exertion (RPE) scale Yes       Intervention Provide education and explanation on how to use RPE scale       Expected Outcomes Short Term: Able to use RPE daily in rehab to express subjective intensity level;Long Term:  Able to use RPE to guide intensity level when exercising independently       Knowledge and understanding of Target Heart Rate Range (THRR) Yes       Intervention Provide education and explanation of THRR including how the numbers were predicted and where they are located for reference       Expected Outcomes Short Term: Able to state/look up THRR;Long Term: Able to use THRR to govern intensity when exercising independently;Short Term: Able to use daily as guideline for intensity in rehab       Understanding of Exercise Prescription Yes       Intervention Provide education, explanation, and written materials on patient's individual exercise prescription       Expected Outcomes Short Term: Able to explain program exercise prescription;Long Term: Able to explain home exercise prescription to exercise independently                Exercise Goals  Re-Evaluation :  Exercise Goals Re-Evaluation     Row  Name 07/10/22 1635 07/31/22 1642           Exercise Goal Re-Evaluation   Exercise Goals Review Increase Physical Activity;Understanding of Exercise Prescription;Increase Strength and Stamina;Knowledge and understanding of Target Heart Rate Range (THRR);Able to understand and use rate of perceived exertion (RPE) scale Increase Physical Activity;Understanding of Exercise Prescription;Increase Strength and Stamina;Knowledge and understanding of Target Heart Rate Range (THRR);Able to understand and use rate of perceived exertion (RPE) scale      Comments Pt first day in the CRP2 program. Pt tolerated exercise well with an average MET level of 2.8. See nurse note about some ECG abnormalities. Pt did good on his first day and and is learning his THRR, RPE and ExRx Reviewed MET's and goals. Pt tolerated exercise well with an average MET level of 2.75. See nurse note about some ECG abnormalities. Pt is feeling good about his goals so far and feels like he is doing good with his diet and adding in more variety. Has still had some reaccuring SOB and lightheadness. But he has found he recovers more quickly. Nurse aware. Will review home ExRx after evaluation of symptoms      Expected Outcomes Will continue to monitor pt and progress workloads as tolerated without sign or symptom Will continue to monitor pt and progress workloads as tolerated without sign or symptom               Discharge Exercise Prescription (Final Exercise Prescription Changes):  Exercise Prescription Changes - 07/31/22 1637       Response to Exercise   Blood Pressure (Admit) 84/0   VAD-MAP   Blood Pressure (Exercise) 90/0    Blood Pressure (Exit) 84/0    Heart Rate (Admit) 86 bpm    Heart Rate (Exercise) 112 bpm    Heart Rate (Exit) 87 bpm    Rating of Perceived Exertion (Exercise) 11    Perceived Dyspnea (Exercise) 0    Symptoms Some ECG abnormalities, see nurse note     Comments Reviewed MET's and goals    Duration Progress to 30 minutes of  aerobic without signs/symptoms of physical distress    Intensity THRR unchanged      Progression   Progression Continue to progress workloads to maintain intensity without signs/symptoms of physical distress.    Average METs 2.75      Resistance Training   Training Prescription No      Recumbant Bike   Level 1    RPM 66    Watts 15    Minutes 15    METs 2      NuStep   Level 3    SPM 134    Minutes 15    METs 3.5             Nutrition:  Target Goals: Understanding of nutrition guidelines, daily intake of sodium 1500mg , cholesterol 200mg , calories 30% from fat and 7% or less from saturated fats, daily to have 5 or more servings of fruits and vegetables.  Biometrics:  Pre Biometrics - 07/10/22 1642       Pre Biometrics   Height 6' (1.829 m)    Weight 96 kg    Waist Circumference 42 inches    Hip Circumference 41 inches    Waist to Hip Ratio 1.02 %    BMI (Calculated) 28.7    Triceps Skinfold 6 mm    % Body Fat 24.8 %    Grip Strength 43 kg    Flexibility --  Pt unable to reach   Single Leg Stand 11 seconds              Nutrition Therapy Plan and Nutrition Goals:  Nutrition Therapy & Goals - 07/10/22 1612       Nutrition Therapy   Diet Heart Healthy Diet    Drug/Food Interactions Statins/Certain Fruits;Coumadin/Vit K      Personal Nutrition Goals   Nutrition Goal Patient to identify strategies for reducing cardiovascular risk by attending the weekly [employer] education and nutrition series    Personal Goal #2 Patient to improve diet quality by using the plate method as a daily guide for meal planning to include lean protein/plant protein, fruits, vegetables, whole grains, nonfat dairy as part of well balanced diet    Personal Goal #3 Patient to reduce sodium to 1500mg  per day    Comments Kwan has previously completed pulmonary and cardiac rehab. Rendon enjoys cooking and  continues to work toward high fiber intake and mindfulness of saturated fat intake. Jaquavis will continue to benefit from participation in intensive cardiac rehab for nutrition, exercise, and lifestyle modification.      Intervention Plan   Intervention Prescribe, educate and counsel regarding individualized specific dietary modifications aiming towards targeted core components such as weight, hypertension, lipid management, diabetes, heart failure and other comorbidities.;Nutrition handout(s) given to patient.    Expected Outcomes Short Term Goal: Understand basic principles of dietary content, such as calories, fat, sodium, cholesterol and nutrients.;Long Term Goal: Adherence to prescribed nutrition plan.             Nutrition Assessments:  Nutrition Assessments - 07/04/22 1600       Rate Your Plate Scores   Pre Score 51            MEDIFICTS Score Key: ?70 Need to make dietary changes  40-70 Heart Healthy Diet ? 40 Therapeutic Level Cholesterol Diet   Flowsheet Row INTENSIVE CARDIAC REHAB ORIENT from 07/04/2022 in [facility]  Picture Your Plate Total Score on Admission 51      Picture Your Plate Scores: [id number] Unhealthy dietary pattern with much room for improvement. 41-50 Dietary pattern unlikely to meet recommendations for good health and room for improvement. 51-60 More healthful dietary pattern, with some room for improvement.  >60 Healthy dietary pattern, although there may be some specific behaviors that could be improved.    Nutrition Goals Re-Evaluation:  Nutrition Goals Re-Evaluation     [place] Name 07/10/22 1612             Goals   Current Weight 211 lb 10.3 oz (96 kg)       Comment continues regular follow-up with anti-coagulation clinic       Expected Outcome Pramod has previously completed pulmonary and cardiac rehab. Braun enjoys cooking and continues to work toward high fiber intake and mindfulness  of saturated fat intake. Capers will continue to benefit from participation in intensive cardiac rehab for nutrition, exercise, and lifestyle modification.                Nutrition Goals Re-Evaluation:  Nutrition Goals Re-Evaluation     [place] Name 07/10/22 1612             Goals   Current Weight 211 lb 10.3 oz (96 kg)       Comment continues regular follow-up with anti-coagulation clinic       Expected Outcome Keithan has previously completed pulmonary and cardiac  rehab. Mcneal enjoys cooking and continues to work toward high fiber intake and mindfulness of saturated fat intake. Muhammed will continue to benefit from participation in intensive cardiac rehab for nutrition, exercise, and lifestyle modification.                Nutrition Goals Discharge (Final Nutrition Goals Re-Evaluation):  Nutrition Goals Re-Evaluation - 07/10/22 1612       Goals   Current Weight 211 lb 10.3 oz (96 kg)    Comment continues regular follow-up with anti-coagulation clinic    Expected Outcome Byran has previously completed pulmonary and cardiac rehab. Inigo enjoys cooking and continues to work toward high fiber intake and mindfulness of saturated fat intake. Keyvon will continue to benefit from participation in intensive cardiac rehab for nutrition, exercise, and lifestyle modification.             Psychosocial: Target Goals: Acknowledge presence or absence of significant depression and/or stress, maximize coping skills, provide positive support system. Participant is able to verbalize types and ability to use techniques and skills needed for reducing stress and depression.  Initial Review & Psychosocial Screening:  Initial Psych Review & Screening - 07/04/22 1348       Initial Review   Current issues with Current Stress Concerns;History of Depression;Current Anxiety/Panic    Source of Stress Concerns Chronic Illness    Comments Siddarth has a history of depression currently taking Zoloft. Martis  says he is grinding his teeth at night. Wants to discuss weaning off Zoloft. Luccas admits to experiencing some anxiety since his two hospitaliztoins in December.      Family Dynamics   Good Support System? Yes   Kamsiyochukwu lives alone he has a sister who lives in the area for support     Barriers   Psychosocial barriers to participate in program The patient should benefit from training in stress management and relaxation.      Screening Interventions   Interventions Encouraged to exercise    Expected Outcomes Long Term Goal: Stressors or current issues are controlled or eliminated.;Short Term goal: Identification and review with participant of any Quality of Life or Depression concerns found by scoring the questionnaire.             Quality of Life Scores:  Quality of Life - 07/04/22 1349       Quality of Life   Select Quality of Life      Quality of Life Scores   Health/Function Pre 18.7 %    Socioeconomic Pre 20.86 %    Psych/Spiritual Pre 16.57 %    Family Pre 17 %    GLOBAL Pre 18.5 %            Scores of 19 and below usually indicate a poorer quality of life in these areas.  A difference of  2-3 points is a clinically meaningful difference.  A difference of 2-3 points in the total score of the Quality of Life Index has been associated with significant improvement in overall quality of life, self-image, physical symptoms, and general health in studies assessing change in quality of life.  PHQ-9: Review Flowsheet  More data exists      07/26/2022 07/15/2022 07/04/2022 02/22/2022 11/02/2021  Depression screen PHQ 2/9  Decreased Interest 0 0 0 0 0  Down, Depressed, Hopeless 0 0 0 0 0  PHQ - 2 Score 0 0 0 0 0  Altered sleeping - - 1 0 1  Tired, decreased energy - - 0 0  1  Change in appetite - - 0 0 0  Feeling bad or failure about yourself  - - 0 0 0  Trouble concentrating - - 0 0 0  Moving slowly or fidgety/restless - - 0 0 0  Suicidal thoughts - - 0 0 0  PHQ-9 Score - -  1 0 2  Difficult doing work/chores - - Not difficult at all - Not difficult at all   Interpretation of Total Score  Total Score Depression Severity:  1-4 = Minimal depression, 5-9 = Mild depression, 10-14 = Moderate depression, 15-19 = Moderately severe depression, 20-27 = Severe depression   Psychosocial Evaluation and Intervention:   Psychosocial Re-Evaluation:  Psychosocial Re-Evaluation     Row Name 07/31/22 1742             Psychosocial Re-Evaluation   Current issues with Current Stress Concerns;History of Depression;Current Anxiety/Panic       Comments Natnael does have health related concerns no other concerns have been voiced during exercise at intensive cardiac rehab       Expected Outcomes Kyah will have decreased anxiety less stress upon completion of intensive cardiac rehab       Interventions Encouraged to attend Cardiac Rehabilitation for the exercise;Stress management education;Encouraged to attend Pulmonary Rehabilitation for the exercise       Continue Psychosocial Services  Follow up required by staff         Initial Review   Source of Stress Concerns Chronic Illness       Comments Will continue to monitor and offer support as needed                Psychosocial Discharge (Final Psychosocial Re-Evaluation):  Psychosocial Re-Evaluation - 07/31/22 1742       Psychosocial Re-Evaluation   Current issues with Current Stress Concerns;History of Depression;Current Anxiety/Panic    Comments Kline does have health related concerns no other concerns have been voiced during exercise at intensive cardiac rehab    Expected Outcomes Ovadia will have decreased anxiety less stress upon completion of intensive cardiac rehab    Interventions Encouraged to attend Cardiac Rehabilitation for the exercise;Stress management education;Encouraged to attend Pulmonary Rehabilitation for the exercise    Continue Psychosocial Services  Follow up required by staff      Initial Review    Source of Stress Concerns Chronic Illness    Comments Will continue to monitor and offer support as needed             Vocational Rehabilitation: Provide vocational rehab assistance to qualifying candidates.   Vocational Rehab Evaluation & Intervention:  Vocational Rehab - 07/04/22 1353       Initial Vocational Rehab Evaluation & Intervention   Assessment shows need for Vocational Rehabilitation No   Duward is retired and does not need vocational rehab at this time            Education: Education Goals: Education classes will be provided on a weekly basis, covering required topics. Participant will state understanding/return demonstration of topics presented.    Education     Row Name 07/10/22 1600     Education   Cardiac Education Topics [employer]   Lexicographer Nutrition   Nutrition Facts on Fat   Instruction Review Code 1- Verbalizes Understanding   Class Start Time 1400   Class Stop Time 1450   Class Time Calculation (min) 50 min  [place] Name 07/17/22 1500     Education   Cardiac Education Topics [employer]   Educator Dietitian   Weekly Topic Adding Flavor - Sodium-Free   Instruction Review Code 1- Verbalizes Understanding   Class Start Time 1400   Class Stop Time 1453   Class Time Calculation (min) 53 min    [place] Name 07/19/22 1500     Education   Cardiac Education Topics Pritikin   Architect Education   General Education Heart Disease Risk Reduction   Instruction Review Code 1- Verbalizes Understanding   Class Start Time 1355   Class Stop Time 1440   Class Time Calculation (min) 45 min    [place] Name 07/22/22 1500     Education   Cardiac Education Topics Pritikin   Environmental consultant Exercise   Exercise Workshop  Hotel manager and Fall Prevention   Instruction Review Code 1- Verbalizes Understanding   Class Start Time 1350   Class Stop Time 1432   Class Time Calculation (min) 42 min    [place] Name 07/24/22 1500     Education   Cardiac Education Topics Pritikin   Financial trader   Weekly Topic Fast and Healthy Breakfasts   Instruction Review Code 1- Verbalizes Understanding   Class Start Time 1355   Class Stop Time 1450   Class Time Calculation (min) 55 min    [place] Name 07/26/22 1600     Education   Cardiac Education Topics Pritikin   Charity fundraiser Dietitian   Nutrition Overview of the [place]   Instruction Review Code 1- Verbalizes Understanding   Class Start Time 1400   Class Stop Time 1436   Class Time Calculation (min) 36 min    [place] Name 07/29/22 1600     Education   Cardiac Education Topics Pritikin   [employer] Workshops     Workshops   Educator Exercise Physiologist   Select Psychosocial   Psychosocial Workshop Recognizing and Reducing Stress   Instruction Review Code 1- Verbalizes Understanding   Class Start Time 1405   Class Stop Time 1454   Class Time Calculation (min) 49 min    [place] Name 07/31/22 1600     Education   Cardiac Education Topics Pritikin   Financial trader   Weekly Topic Personalizing Your Pritikin Plate   Instruction Review Code 1- Verbalizes Understanding   Class Start Time 1400   Class Stop Time 1448   Class Time Calculation (min) 48 min            Core Videos: Exercise    Move It!  Clinical staff conducted group or individual video education with verbal and written material and guidebook.  Patient learns the recommended Pritikin exercise program. Exercise with the goal of living a long, healthy life. Some of the health benefits of exercise include controlled diabetes, healthier blood pressure  levels, improved cholesterol levels, improved heart and lung capacity, improved sleep, and better body composition. Everyone should speak with their doctor before starting or changing an exercise routine.  Biomechanical Limitations Clinical staff conducted group or individual video  education with verbal and written material and guidebook.  Patient learns how biomechanical limitations can impact exercise and how we can mitigate and possibly overcome limitations to have an impactful and balanced exercise routine.  Body Composition Clinical staff conducted group or individual video education with verbal and written material and guidebook.  Patient learns that body composition (ratio of muscle mass to fat mass) is a key component to assessing overall fitness, rather than body weight alone. Increased fat mass, especially visceral belly fat, can put [place] at increased risk for metabolic syndrome, type 2 diabetes, heart disease, and even death. It is recommended to combine diet and exercise (cardiovascular and resistance training) to improve your body composition. Seek guidance from your physician and exercise physiologist before implementing an exercise routine.  Exercise Action Plan Clinical staff conducted group or individual video education with verbal and written material and guidebook.  Patient learns the recommended strategies to achieve and enjoy long-term exercise adherence, including variety, self-motivation, self-efficacy, and positive decision making. Benefits of exercise include fitness, good health, weight management, more energy, better sleep, less stress, and overall well-being.  Medical   Heart Disease Risk Reduction Clinical staff conducted group or individual video education with verbal and written material and guidebook.  Patient learns our heart is our most vital organ as it circulates oxygen, nutrients, white blood cells, and hormones throughout the entire body, and carries waste away.  Data supports a plant-based eating plan like the Pritikin Program for its effectiveness in slowing progression of and reversing heart disease. The video provides a number of recommendations to address heart disease.   Metabolic Syndrome and Belly Fat  Clinical staff conducted group or individual video education with verbal and written material and guidebook.  Patient learns what metabolic syndrome is, how it leads to heart disease, and how one can reverse it and keep it from coming back. You have metabolic syndrome if you have 3 of the following 5 criteria: abdominal obesity, high blood pressure, high triglycerides, low HDL cholesterol, and high blood sugar.  Hypertension and Heart Disease Clinical staff conducted group or individual video education with verbal and written material and guidebook.  Patient learns that high blood pressure, or hypertension, is very common in the [place]. Hypertension is largely due to excessive salt intake, but other important risk factors include being overweight, physical inactivity, drinking too much alcohol, smoking, and not eating enough potassium from fruits and vegetables. High blood pressure is a leading risk factor for heart attack, stroke, congestive heart failure, dementia, kidney failure, and premature death. Long-term effects of excessive salt intake include stiffening of the arteries and thickening of heart muscle and organ damage. Recommendations include ways to reduce hypertension and the risk of heart disease.  Diseases of Our Time - Focusing on Diabetes Clinical staff conducted group or individual video education with verbal and written material and guidebook.  Patient learns why the best way to stop diseases of our time is prevention, through food and other lifestyle changes. Medicine (such as prescription pills and surgeries) is often only a Band-Aid on the problem, not a long-term solution. Most common diseases of our time include obesity, type 2  diabetes, hypertension, heart disease, and cancer. The Pritikin Program is recommended and has been proven to help reduce, reverse, and/or prevent the damaging effects of metabolic syndrome.  Nutrition   Overview of the Pritikin Eating Plan  Clinical staff conducted group or individual video education with verbal and written material and guidebook.  Patient learns  about the [place] for disease risk reduction. The [place] emphasizes a wide variety of unrefined, minimally-processed carbohydrates, like fruits, vegetables, whole grains, and legumes. Go, Caution, and Stop food choices are explained. Plant-based and lean animal proteins are emphasized. Rationale provided for low sodium intake for blood pressure control, low added sugars for blood sugar stabilization, and low added fats and oils for coronary artery disease risk reduction and weight management.  Calorie Density  Clinical staff conducted group or individual video education with verbal and written material and guidebook.  Patient learns about calorie density and how it impacts the Pritikin Eating Plan. Knowing the characteristics of the food you choose will help you decide whether those foods will lead to weight gain or weight loss, and whether you want to consume more or less of them. Weight loss is usually a side effect of the Pritikin Eating Plan because of its focus on low calorie-dense foods.  Label Reading  Clinical staff conducted group or individual video education with verbal and written material and guidebook.  Patient learns about the Pritikin recommended label reading guidelines and corresponding recommendations regarding calorie density, added sugars, sodium content, and whole grains.  Dining Out - Part 1  Clinical staff conducted group or individual video education with verbal and written material and guidebook.  Patient learns that restaurant meals can be sabotaging because they can be so high in  calories, fat, sodium, and/or sugar. Patient learns recommended strategies on how to positively address this and avoid unhealthy pitfalls.  Facts on Fats  Clinical staff conducted group or individual video education with verbal and written material and guidebook.  Patient learns that lifestyle modifications can be just as effective, if not more so, as many medications for lowering your risk of heart disease. A Pritikin lifestyle can help to reduce your risk of inflammation and atherosclerosis (cholesterol build-up, or plaque, in the artery walls). Lifestyle interventions such as dietary choices and physical activity address the cause of atherosclerosis. A review of the types of fats and their impact on blood cholesterol levels, along with dietary recommendations to reduce fat intake is also included.  Nutrition Action Plan  Clinical staff conducted group or individual video education with verbal and written material and guidebook.  Patient learns how to incorporate Pritikin recommendations into their lifestyle. Recommendations include planning and keeping personal health goals in mind as an important part of their success.  Healthy Mind-Set    Healthy Minds, Bodies, Hearts  Clinical staff conducted group or individual video education with verbal and written material and guidebook.  Patient learns how to identify when they are stressed. Video will discuss the impact of that stress, as well as the many benefits of stress management. Patient will also be introduced to stress management techniques. The way we think, act, and feel has an impact on our hearts.  How Our Thoughts Can Heal Our Hearts  Clinical staff conducted group or individual video education with verbal and written material and guidebook.  Patient learns that negative thoughts can cause depression and anxiety. This can result in negative lifestyle behavior and serious health problems. Cognitive behavioral therapy is an effective method to  help control our thoughts in order to change and improve our emotional outlook.  Additional Videos:  Exercise    Improving Performance  Clinical staff conducted group or individual video education with verbal and written material and guidebook.  Patient learns to use a non-linear approach by alternating intensity levels and lengths of time  spent exercising to help burn more calories and lose more body fat. Cardiovascular exercise helps improve heart health, metabolism, hormonal balance, blood sugar control, and recovery from fatigue. Resistance training improves strength, endurance, balance, coordination, reaction time, metabolism, and muscle mass. Flexibility exercise improves circulation, posture, and balance. Seek guidance from your physician and exercise physiologist before implementing an exercise routine and learn your capabilities and proper form for all exercise.  Introduction to Yoga  Clinical staff conducted group or individual video education with verbal and written material and guidebook.  Patient learns about yoga, a discipline of the coming together of mind, breath, and body. The benefits of yoga include improved flexibility, improved range of motion, better posture and core strength, increased lung function, weight loss, and positive self-image. Yoga's heart health benefits include lowered blood pressure, healthier heart rate, decreased cholesterol and triglyceride levels, improved immune function, and reduced stress. Seek guidance from your physician and exercise physiologist before implementing an exercise routine and learn your capabilities and proper form for all exercise.  Medical   Aging: Enhancing Your Quality of Life  Clinical staff conducted group or individual video education with verbal and written material and guidebook.  Patient learns key strategies and recommendations to stay in good physical health and enhance quality of life, such as prevention strategies, having an  advocate, securing a [place], and keeping a list of medications and system for tracking them. It also discusses how to avoid risk for bone loss.  Biology of Weight Control  Clinical staff conducted group or individual video education with verbal and written material and guidebook.  Patient learns that weight gain occurs because we consume more calories than we burn (eating more, moving less). Even if your body weight is normal, you may have higher ratios of fat compared to muscle mass. Too much body fat puts you at increased risk for cardiovascular disease, heart attack, stroke, type 2 diabetes, and obesity-related cancers. In addition to exercise, following the [place] can help reduce your risk.  Decoding Lab Results  Clinical staff conducted group or individual video education with verbal and written material and guidebook.  Patient learns that lab test reflects one measurement whose values change over time and are influenced by many factors, including medication, stress, sleep, exercise, food, hydration, pre-existing medical conditions, and more. It is recommended to use the knowledge from this video to become more involved with your lab results and evaluate your numbers to speak with your doctor.   Diseases of Our Time - Overview  Clinical staff conducted group or individual video education with verbal and written material and guidebook.  Patient learns that according to the CDC, 50% to 70% of chronic diseases (such as obesity, type 2 diabetes, elevated lipids, hypertension, and heart disease) are avoidable through lifestyle improvements including healthier food choices, listening to satiety cues, and increased physical activity.  Sleep Disorders Clinical staff conducted group or individual video education with verbal and written material and guidebook.  Patient learns how good quality and duration of sleep are important to overall health and  well-being. Patient also learns about sleep disorders and how they impact health along with recommendations to address them, including discussing with a physician.  Nutrition  Dining Out - Part 2 Clinical staff conducted group or individual video education with verbal and written material and guidebook.  Patient learns how to plan ahead and communicate in order to maximize their dining experience in a healthy and nutritious manner.  Included are recommended food choices based on the type of restaurant the patient is visiting.   Fueling a Best boy conducted group or individual video education with verbal and written material and guidebook.  There is a strong connection between our food choices and our health. Diseases like obesity and type 2 diabetes are very prevalent and are in large-part due to lifestyle choices. The Pritikin Eating Plan provides plenty of food and hunger-curbing satisfaction. It is easy to follow, affordable, and helps reduce health risks.  Menu Workshop  Clinical staff conducted group or individual video education with verbal and written material and guidebook.  Patient learns that restaurant meals can sabotage health goals because they are often packed with calories, fat, sodium, and sugar. Recommendations include strategies to plan ahead and to communicate with the manager, chef, or server to help order a healthier meal.  Planning Your Eating Strategy  Clinical staff conducted group or individual video education with verbal and written material and guidebook.  Patient learns about the [place] and its benefit of reducing the risk of disease. The [place] does not focus on calories. Instead, it emphasizes high-quality, nutrient-rich foods. By knowing the characteristics of the foods, we choose, we can determine their calorie density and make informed decisions.  Targeting Your Nutrition Priorities  Clinical staff conducted group or  individual video education with verbal and written material and guidebook.  Patient learns that lifestyle habits have a tremendous impact on disease risk and progression. This video provides eating and physical activity recommendations based on your personal health goals, such as reducing LDL cholesterol, losing weight, preventing or controlling type 2 diabetes, and reducing high blood pressure.  Vitamins and Minerals  Clinical staff conducted group or individual video education with verbal and written material and guidebook.  Patient learns different ways to obtain key vitamins and minerals, including through a recommended healthy diet. It is important to discuss all supplements you take with your doctor.   Healthy Mind-Set    Smoking Cessation  Clinical staff conducted group or individual video education with verbal and written material and guidebook.  Patient learns that cigarette smoking and tobacco addiction pose a serious health risk which affects millions of people. Stopping smoking will significantly reduce the risk of heart disease, lung disease, and many forms of cancer. Recommended strategies for quitting are covered, including working with your doctor to develop a successful plan.  Culinary   Becoming a Financial trader conducted group or individual video education with verbal and written material and guidebook.  Patient learns that cooking at home can be healthy, cost-effective, quick, and puts them in control. Keys to cooking healthy recipes will include looking at your recipe, assessing your equipment needs, planning ahead, making it simple, choosing cost-effective seasonal ingredients, and limiting the use of added fats, salts, and sugars.  Cooking - Breakfast and Snacks  Clinical staff conducted group or individual video education with verbal and written material and guidebook.  Patient learns how important breakfast is to satiety and nutrition through the entire  day. Recommendations include key foods to eat during breakfast to help stabilize blood sugar levels and to prevent overeating at meals later in the day. Planning ahead is also a key component.  Cooking - Human resources officer conducted group or individual video education with verbal and written material and guidebook.  Patient learns eating strategies to improve overall health, including an approach to cook more at  home. Recommendations include thinking of animal protein as a side on your plate rather than center stage and focusing instead on lower calorie dense options like vegetables, fruits, whole grains, and plant-based proteins, such as beans. Making sauces in large quantities to freeze for later and leaving the skin on your vegetables are also recommended to maximize your experience.  Cooking - Healthy Salads and Dressing Clinical staff conducted group or individual video education with verbal and written material and guidebook.  Patient learns that vegetables, fruits, whole grains, and legumes are the foundations of the [place]. Recommendations include how to incorporate each of these in flavorful and healthy salads, and how to create homemade salad dressings. Proper handling of ingredients is also covered. Cooking - Soups and [employer] - Soups and Desserts Clinical staff conducted group or individual video education with verbal and written material and guidebook.  Patient learns that Pritikin soups and desserts make for easy, nutritious, and delicious snacks and meal components that are low in sodium, fat, sugar, and calorie density, while high in vitamins, minerals, and filling fiber. Recommendations include simple and healthy ideas for soups and desserts.   Overview     The Pritikin Solution Program Overview Clinical staff conducted group or individual video education with verbal and written material and guidebook.  Patient learns that the results of the  [place] Program have been documented in more than 100 articles published in peer-reviewed journals, and the benefits include reducing risk factors for (and, in some cases, even reversing) high cholesterol, high blood pressure, type 2 diabetes, obesity, and more! An overview of the three key pillars of the Pritikin Program will be covered: eating well, doing regular exercise, and having a healthy mind-set.  WORKSHOPS  Exercise: Exercise Basics: Building Your Action Plan Clinical staff led group instruction and group discussion with PowerPoint presentation and patient guidebook. To enhance the learning environment the use of posters, models and videos may be added. At the conclusion of this workshop, patients will comprehend the difference between physical activity and exercise, as well as the benefits of incorporating both, into their routine. Patients will understand the FITT (Frequency, Intensity, Time, and Type) principle and how to use it to build an exercise action plan. In addition, safety concerns and other considerations for exercise and cardiac rehab will be addressed by the presenter. The purpose of this lesson is to promote a comprehensive and effective weekly exercise routine in order to improve patients' overall level of fitness.   Managing Heart Disease: Your Path to a Healthier Heart Clinical staff led group instruction and group discussion with PowerPoint presentation and patient guidebook. To enhance the learning environment the use of posters, models and videos may be added.At the conclusion of this workshop, patients will understand the anatomy and physiology of the heart. Additionally, they will understand how Pritikin's three pillars impact the risk factors, the progression, and the management of heart disease.  The purpose of this lesson is to provide a high-level overview of the heart, heart disease, and how the Pritikin lifestyle positively impacts risk factors.  Exercise  Biomechanics Clinical staff led group instruction and group discussion with PowerPoint presentation and patient guidebook. To enhance the learning environment the use of posters, models and videos may be added. Patients will learn how the structural parts of their bodies function and how these functions impact their daily activities, movement, and exercise. Patients will learn how to promote a neutral spine, learn how to manage pain, and identify ways  to improve their physical movement in order to promote healthy living. The purpose of this lesson is to expose patients to common physical limitations that impact physical activity. Participants will learn practical ways to adapt and manage aches and pains, and to minimize their effect on regular exercise. Patients will learn how to maintain good posture while sitting, walking, and lifting.  Balance Training and Fall Prevention  Clinical staff led group instruction and group discussion with PowerPoint presentation and patient guidebook. To enhance the learning environment the use of posters, models and videos may be added. At the conclusion of this workshop, patients will understand the importance of their sensorimotor skills (vision, proprioception, and the vestibular system) in maintaining their ability to balance as they age. Patients will apply a variety of balancing exercises that are appropriate for their current level of function. Patients will understand the common causes for poor balance, possible solutions to these problems, and ways to modify their physical environment in order to minimize their fall risk. The purpose of this lesson is to teach patients about the importance of maintaining balance as they age and ways to minimize their risk of falling.  WORKSHOPS   Nutrition:  Fueling a Scientist, research (physical sciences) led group instruction and group discussion with PowerPoint presentation and patient guidebook. To enhance the learning  environment the use of posters, models and videos may be added. Patients will review the foundational principles of the [place] and understand what constitutes a serving size in each of the food groups. Patients will also learn Pritikin-friendly foods that are better choices when away from home and review make-ahead meal and snack options. Calorie density will be reviewed and applied to three nutrition priorities: weight maintenance, weight loss, and weight gain. The purpose of this lesson is to reinforce (in a group setting) the key concepts around what patients are recommended to eat and how to apply these guidelines when away from home by planning and selecting Pritikin-friendly options. Patients will understand how calorie density may be adjusted for different weight management goals.  Mindful Eating  Clinical staff led group instruction and group discussion with PowerPoint presentation and patient guidebook. To enhance the learning environment the use of posters, models and videos may be added. Patients will briefly review the concepts of the [place] and the importance of low-calorie dense foods. The concept of mindful eating will be introduced as well as the importance of paying attention to internal hunger signals. Triggers for non-hunger eating and techniques for dealing with triggers will be explored. The purpose of this lesson is to provide patients with the opportunity to review the basic principles of the [place], discuss the value of eating mindfully and how to measure internal cues of hunger and fullness using the Hunger Scale. Patients will also discuss reasons for non-hunger eating and learn strategies to use for controlling emotional eating.  Targeting Your Nutrition Priorities Clinical staff led group instruction and group discussion with PowerPoint presentation and patient guidebook. To enhance the learning environment the use of posters, models and  videos may be added. Patients will learn how to determine their genetic susceptibility to disease by reviewing their family history. Patients will gain insight into the importance of diet as part of an overall healthy lifestyle in mitigating the impact of genetics and other environmental insults. The purpose of this lesson is to provide patients with the opportunity to assess their personal nutrition priorities by looking at their family history, their own health  history and current risk factors. Patients will also be able to discuss ways of prioritizing and modifying the [place] for their highest risk areas  Menu  Clinical staff led group instruction and group discussion with PowerPoint presentation and patient guidebook. To enhance the learning environment the use of posters, models and videos may be added. Using menus brought in from [employer], or printed from [employer], patients will apply the [place] dining out guidelines that were presented in the [employer] video. Patients will also be able to practice these guidelines in a variety of provided scenarios. The purpose of this lesson is to provide patients with the opportunity to practice hands-on learning of the [place] with actual menus and practice scenarios.  Label Reading Clinical staff led group instruction and group discussion with PowerPoint presentation and patient guidebook. To enhance the learning environment the use of posters, models and videos may be added. Patients will review and discuss the Pritikin label reading guidelines presented in Pritikin's Label Reading Educational series video. Using fool labels brought in from local grocery stores and markets, patients will apply the label reading guidelines and determine if the packaged food meet the Pritikin guidelines. The purpose of this lesson is to provide patients with the opportunity to review, discuss, and practice  hands-on learning of the Pritikin Label Reading guidelines with actual packaged food labels. [place] Workshops are designed to teach patients ways to prepare quick, simple, and affordable recipes at home. The importance of nutrition's role in chronic disease risk reduction is reflected in its emphasis in the overall Pritikin program. By learning how to prepare essential core Pritikin Eating Plan recipes, patients will increase control over what they eat; be able to customize the flavor of foods without the use of added salt, sugar, or fat; and improve the quality of the food they consume. By learning a set of core recipes which are easily assembled, quickly prepared, and affordable, patients are more likely to prepare more healthy foods at home. These workshops focus on convenient breakfasts, simple entres, side dishes, and desserts which can be prepared with minimal effort and are consistent with nutrition recommendations for cardiovascular risk reduction. Cooking [employer] are taught by a Engineer, materials (RD) who has been trained by the [employer]. The chef or RD has a clear understanding of the importance of minimizing - if not completely eliminating - added fat, sugar, and sodium in recipes. Throughout the series of [place] Workshop sessions, patients will learn about healthy ingredients and efficient methods of cooking to build confidence in their capability to prepare    Cooking School weekly topics:  Adding Flavor- Sodium-Free  Fast and Healthy Breakfasts  Powerhouse Plant-Based Proteins  Satisfying Salads and Dressings  Simple Sides and Sauces  International Cuisine-Spotlight on the [place] Zones  Delicious Desserts  Savory Soups  [employer] - Meals in a Agricultural consultant Appetizers and Snacks  Comforting Weekend Breakfasts  One-Pot Wonders   Fast Evening Meals  Contractor Your Pritikin  Plate  WORKSHOPS   Healthy Mindset (Psychosocial):  Focused Goals, Sustainable Changes Clinical staff led group instruction and group discussion with PowerPoint presentation and patient guidebook. To enhance the learning environment the use of posters, models and videos may be added. Patients will be able to apply effective goal setting strategies to establish at least one personal goal, and then take consistent, meaningful action toward that  goal. They will learn to identify common barriers to achieving personal goals and develop strategies to overcome them. Patients will also gain an understanding of how our mind-set can impact our ability to achieve goals and the importance of cultivating a positive and growth-oriented mind-set. The purpose of this lesson is to provide patients with a deeper understanding of how to set and achieve personal goals, as well as the tools and strategies needed to overcome common obstacles which may arise along the way.  From Head to Heart: The Power of a Healthy Outlook  Clinical staff led group instruction and group discussion with PowerPoint presentation and patient guidebook. To enhance the learning environment the use of posters, models and videos may be added. Patients will be able to recognize and describe the impact of emotions and mood on physical health. They will discover the importance of self-care and explore self-care practices which may work for them. Patients will also learn how to utilize the 4 C's to cultivate a healthier outlook and better manage stress and challenges. The purpose of this lesson is to demonstrate to patients how a healthy outlook is an essential part of maintaining good health, especially as they continue their cardiac rehab journey.  Healthy Sleep for a Healthy Heart Clinical staff led group instruction and group discussion with PowerPoint presentation and patient guidebook. To enhance the learning environment the use of posters,  models and videos may be added. At the conclusion of this workshop, patients will be able to demonstrate knowledge of the importance of sleep to overall health, well-being, and quality of life. They will understand the symptoms of, and treatments for, common sleep disorders. Patients will also be able to identify daytime and nighttime behaviors which impact sleep, and they will be able to apply these tools to help manage sleep-related challenges. The purpose of this lesson is to provide patients with a general overview of sleep and outline the importance of quality sleep. Patients will learn about a few of the most common sleep disorders. Patients will also be introduced to the concept of "sleep hygiene," and discover ways to self-manage certain sleeping problems through simple daily behavior changes. Finally, the workshop will motivate patients by clarifying the links between quality sleep and their goals of heart-healthy living.   Recognizing and Reducing Stress Clinical staff led group instruction and group discussion with PowerPoint presentation and patient guidebook. To enhance the learning environment the use of posters, models and videos may be added. At the conclusion of this workshop, patients will be able to understand the types of stress reactions, differentiate between acute and chronic stress, and recognize the impact that chronic stress has on their health. They will also be able to apply different coping mechanisms, such as reframing negative self-talk. Patients will have the opportunity to practice a variety of stress management techniques, such as deep abdominal breathing, progressive muscle relaxation, and/or guided imagery.  The purpose of this lesson is to educate patients on the role of stress in their lives and to provide healthy techniques for coping with it.  Learning Barriers/Preferences:  Learning Barriers/Preferences - 07/04/22 1418       Learning Barriers/Preferences   Learning  Barriers Sight   wears reading glasses   Learning Preferences Audio;Group Instruction;Individual Instruction;Pictoral;Skilled Demonstration;Verbal Instruction;Video;Written Material             Education Topics:  Knowledge Questionnaire Score:  Knowledge Questionnaire Score - 07/04/22 1355       Knowledge Questionnaire Score   Pre Score  25/28             Core Components/Risk Factors/Patient Goals at Admission:  Personal Goals and Risk Factors at Admission - 07/04/22 1419       Core Components/Risk Factors/Patient Goals on Admission    Weight Management Yes    Intervention Weight Management: Develop a combined nutrition and exercise program designed to reach desired caloric intake, while maintaining appropriate intake of nutrient and fiber, sodium and fats, and appropriate energy expenditure required for the weight goal.    Expected Outcomes Short Term: Continue to assess and modify interventions until short term weight is achieved;Long Term: Adherence to nutrition and physical activity/exercise program aimed toward attainment of established weight goal    Heart Failure --    Hypertension Yes    Lipids Yes    Stress Yes             Core Components/Risk Factors/Patient Goals Review:   Goals and Risk Factor Review     Row Name 07/31/22 1745             Core Components/Risk Factors/Patient Goals Review   Personal Goals Review Weight Management/Obesity;Hypertension;Lipids;Stress;Heart Failure       Review Christoval is doing well with exercise at intensive cardiac rehab. Map's have been stable       Expected Outcomes [place] will continue to participate in intensive cardiac rehab for exercise, nutrition and lifestyle modifications                Core Components/Risk Factors/Patient Goals at Discharge (Final Review):   Goals and Risk Factor Review - 07/31/22 1745       Core Components/Risk Factors/Patient Goals Review   Personal Goals Review Weight  Management/Obesity;Hypertension;Lipids;Stress;Heart Failure    Review Jerime is doing well with exercise at intensive cardiac rehab. Map's have been stable    Expected Outcomes Willam will continue to participate in intensive cardiac rehab for exercise, nutrition and lifestyle modifications             ITP Comments:  ITP Comments     Row Name 07/04/22 1021 07/09/22 1154 07/31/22 1740       ITP Comments Dr. Fransico Him medical director. Introduction to pritikin education/ intensive cardiac rehab. Initial orientation packet reviewed with patient. 30 day ITP Review.  Kaare is scheduled to begin intensive cardiac rehab on 07/10/22 30 day ITP Review.  Kord has good attendance and participation in intensive cardiac rehab. Naftoli is doing well with exercise              Comments: See ITP comments.Harrell Gave RN BSN

## 2022-08-01 ENCOUNTER — Ambulatory Visit (HOSPITAL_COMMUNITY): Payer: Self-pay | Admitting: Pharmacist

## 2022-08-01 LAB — POCT INR: INR: 3.6 — AB (ref 2.0–3.0)

## 2022-08-02 ENCOUNTER — Ambulatory Visit (HOSPITAL_COMMUNITY): Payer: Medicare Other

## 2022-08-02 ENCOUNTER — Encounter (HOSPITAL_COMMUNITY)
Admission: RE | Admit: 2022-08-02 | Discharge: 2022-08-02 | Disposition: A | Discharge status: Home / Self Care | Payer: Medicare Other | Source: Ambulatory Visit | Attending: Internal Medicine | Admitting: Internal Medicine

## 2022-08-02 ENCOUNTER — Other Ambulatory Visit (HOSPITAL_COMMUNITY): Payer: Self-pay | Admitting: *Deleted

## 2022-08-02 DIAGNOSIS — Z95811 Presence of heart assist device: Secondary | ICD-10-CM

## 2022-08-02 DIAGNOSIS — Z7901 Long term (current) use of anticoagulants: Secondary | ICD-10-CM

## 2022-08-02 DIAGNOSIS — I2102 ST elevation (STEMI) myocardial infarction involving left anterior descending coronary artery: Secondary | ICD-10-CM | POA: Diagnosis not present

## 2022-08-04 NOTE — Progress Notes (Signed)
LVAD Follow-up Clinic Note  PCP: [name], MD HF MD: DB   HPI:  Brandon [name] is a 67 year old male with history of tobacco use, CAD s/p previous anterior MI, severe systolic HF s/p HM-3 VAD [id number], severe AS s/p AVR, endocarditis.    Admitted 12/20 with anterior ST elevation MI. Cath with chronically occluded RCA (L>>R collaterals) and thrombotic occlusion of proximal LAD treated with PCI. Echo w/ reduced LVEF 30-35% w/ apical aneurysm. RV ok. Post cath, he required milrinone for cardiogenic shock. Subsequently underwent Barostim.   Underwent outpatient [facility] 11/29/20 as part of VAD w/u. Post procedure, developed severe rigors and fever. Admitted to ICU Blood Cx + strep. 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  Likely low-flow low gradient AS. Underwent ICD extraction..    Admitted for cardiogenic shock in 10/22.  He underwent placement of HM3 VAD and aortic valve replacement with 25 mm Edwards Inspiris Resilia valve on 04/06/21. Surgical path came back 11/28 with evidence of possible "acute aortic valve endocarditis". 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 Discharged 05/09/21.   He had a complicated month of December.  On 04/22/2022 he presented with shortness of breath and diaphoresis.  Found to be in ventricular fibrillation.  Was defibrillated.  Then on Christmas day he presented back with chest pain, nausea, and shortness of breath.  ECG showed ST elevation.  Catheterization showed stable CAD. Cardiac CT suspicious for clot on AoV. INR increased to 2.5-3.0 range.   Had PET 1/24 which showed increased size of the spiculated LUL pulmonary nodule which is hypermetabolic concerning for primary bronchogenic carcinoma. Has seen Dr. Roxan Hockey and referred  for stereotactic radiation without biopsy.    Follow up for Heart Failure/LVAD: Here for f/u. Feeling much better. Starting to gain more confidence with exercising. Mild DOE. No CP. Denies orthopnea or PND. No fevers, chills or problems with driveline. No bleeding, melena or neuro symptoms. No VAD alarms. Taking all meds as prescribed.   VAD Interrogation: Speed: 5800 Flow: 5.3 Power: 4.8w    PI: 3.3 Alarms: none Events: 50 - 70 PI events daily  Fixed speed: 5800 Low speed limit: 5500   Primary controller: back up battery due for replacement in 19 months Secondary controller:  back up battery due for replacement in 20 months    I reviewed the LVAD parameters from today and compared the results to the patient's prior recorded data. LVAD interrogation was NEGATIVE for significant power changes, NEGATIVE for clinical alarms and STABLE for PI events/speed drops. No programming changes were made and pump is functioning within specified parameters. Pt is performing daily controller and system monitor self tests along with completing weekly and monthly maintenance for LVAD equipment.   LVAD equipment check completed and is in good working order. Back-up equipment  present.    Annual Equipment Maintenance on UBC/PM was performed on 06/03/22.    Exit Site Care: Dressing CDI. No redness, tenderness, drainage or odor reported by patient. Will continue weekly dressings.  Provided patient with 8 weekly kits.     Past Medical History:  Diagnosis Date   AICD (automatic cardioverter/defibrillator) present 08/31/2019   Ankylosing spondylitis ([place])    Arthritis    BENIGN PROSTATIC HYPERTROPHY 06/07/2008   CHF (congestive heart failure) (HCC)    COLONIC POLYPS, HX  OF 06/07/2008   Coronary artery disease    Depression    H/O hiatal hernia    Heart failure ([place])    HYPERLIPIDEMIA 06/07/2008   HYPERTENSION 06/07/2008   Myocardial infarction [facility]) 2005   NSTEMI, s/p LAD stent   NEPHROLITHIASIS,  HX OF 06/07/2008   STEMI (ST elevation myocardial infarction) ([place]) 04/27/2019    Current Outpatient Medications  Medication Sig Dispense Refill   acetaminophen (TYLENOL) 500 MG tablet Take 1,000 mg by mouth every 6 (six) hours as needed (pain.).     atorvastatin (LIPITOR) 80 MG tablet TAKE 1 TABLET BY MOUTH ONCE DAILY AT 6PM. 90 tablet 3   Carboxymethylcellulose Sodium (ARTIFICIAL TEARS OP) Place 1 drop into both eyes daily as needed (for dryness or irritation).     eplerenone (INSPRA) 50 MG tablet Take 1 tablet (50 mg total) by mouth in the morning. 30 tablet 6   ezetimibe (ZETIA) 10 MG tablet Take 1 tablet (10 mg total) by mouth daily. 90 tablet 3   gabapentin (NEURONTIN) 300 MG capsule Take 1 capsule (300 mg total) by mouth 3 (three) times daily. 90 capsule 3   losartan (COZAAR) 25 MG tablet Take 1/2 tablet (12.5 mg total) by mouth daily. 30 tablet 5   melatonin 5 MG TABS Take 1 tablet (5 mg total) by mouth at bedtime. (Patient taking differently: Take 10 mg by mouth once a week.) 30 tablet 6   ondansetron (ZOFRAN) 4 MG tablet Take 1 tablet (4 mg total) by mouth every 8 (eight) hours as needed for nausea or vomiting. 20 tablet 3   pantoprazole (PROTONIX) 40 MG tablet TAKE 1 TABLET BY MOUTH EVERY DAY 90 tablet 3   potassium chloride SA (KLOR-CON M) 20 MEQ tablet Take 1 tablet (20 mEq total) by mouth daily. 90 tablet 3   tamsulosin (FLOMAX) 0.4 MG CAPS capsule Take 1 capsule (0.4 mg total) by mouth daily. 90 capsule 3   traMADol (ULTRAM) 50 MG tablet TAKE 1-2 TABLETS BY MOUTH TWICE A DAY FOR 30 DAYS 120 tablet 0   warfarin (COUMADIN) 2.5 MG tablet Take 7.5 mg (3 tablets) every Monday/Friday and 5 mg (2 tablets) all other days or as instructed by LVAD clinic. 180 tablet 11   nystatin ointment (MYCOSTATIN) Apply 1 application topically 2 (two) times daily. (Patient not taking: Reported on 10/11/2021) 30 g 3   sertraline (ZOLOFT) 100 MG tablet Take 1 tablet (100 mg total) by mouth daily. 90 tablet  3   torsemide (DEMADEX) 20 MG tablet Take 1 tablet (20 mg total) by mouth as needed. 30 tablet 5   No current facility-administered medications for this encounter.    Patient has no known allergies.    Vitals:   02/20/22 1043 02/20/22 1044 02/20/22 1045  BP: (!) 112/100 (!) 100/0 115/72  Pulse: 93    Weight: 94.5 kg (208 lb 6.4 oz)       Vital Signs:     Doppler Pressure: 84 Automatc BP:  127/69 (95) HR: 80                          SPO2:  100% on RA   Weight: 210.8 w/ ept Last weight: 208.4 lb w/ eqt Ht: 6'    Physical Exam: General:  NAD.  HEENT: normal  Neck: supple. JVP not elevated.  Carotids 2+ bilat; no bruits. No lymphadenopathy or thryomegaly appreciated. Cor: LVAD hum.  Lungs: Clear. Abdomen: soft, nontender, non-distended. No hepatosplenomegaly.  No bruits or masses. Good bowel sounds. Driveline site clean. Anchor in place.  Extremities: no cyanosis, clubbing, rash. Warm no edema  Neuro: alert & oriented x 3. No focal deficits. Moves all 4 without problem    ASSESSMENT AND PLAN:  1. Chronic systolic HFr EF due to iCM - Echo 05/19/20 EF 20-25% RV mildly HK. moderate AS  Mean gradient 13 AVA 1.2 cm2 DI 0.30 - s/p HM-III VAD + bioprosthetic AVR 04/06/21 - Episode of cardiogenic shock and anterior ST elevation 12/23. Cath with patent LAD stent. Cardiac CT suggestive of clot on aortic valve. Felt to be possible embolic event  - Stable NYHA II - Volume status ok  - Continue eplerenone 50 mg daily   2. HM-3 LVAD implant - VAD interrogated personally. Parameters stable. - ASA stopped 12/26 - INR 2.1 Goal 2.5-3.0 due to clotting on AoV and possible coronary embolism 12/23 Discussed dosing with PharmD personally. - LDH 156 - Hgb 13.9 - MAPs ok - DL site ok  3. VT/VF 12/23 - remains in NSR. No further VT/VF   4. Severe low-flow aortic stenosis s/p AVR - s/p VAD/bioprosthetic AVR   5. CAD  - s/p anterior STEMI (12/20). LHC showed chronically occluded RCA  (with L>>R collaterals) and thrombotic occlusion of proximal LAD. Underwent PCI of LAD. - Recurrent anterior STEMI in 12/23 Cath 12/23 LAD stent patent. Felt to be embolic from aortic valve - Now s/p VAD - Continue atorvastatin 80.  - off ASA.  - Refer cardiac rehab    6. Paroxysmal Atrial Fibrillation w/ RVR - Remains in NSR  - Off amio - on warfarin.    7. 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  - Tissue sent to [facility] in [place] for broad 16 S ribosomal sequencing for bacterial pathogens as well as T wet Bhilai Bartonella Brucella  - Resolved  8. Ankylosing spondylitis - His local rheumatologist is hesitant to start remicade with VAD/infection risk - D/w [name] VAD program they have recommended f/u with Veva Holes, MD for 2nd opinion if needed.  - Currently feeling ok without it - PCP managing pain meds  9. LUL Bronchogenic CA - h/o tobacco use - Has seen Dr. Roxan Hockey.  - Now referred for XRT  Glori Bickers, MD  5:40 PM

## 2022-08-05 ENCOUNTER — Encounter (HOSPITAL_COMMUNITY)
Admission: RE | Admit: 2022-08-05 | Discharge: 2022-08-05 | Disposition: A | Discharge status: Home / Self Care | Payer: Medicare Other | Source: Ambulatory Visit | Attending: Internal Medicine | Admitting: Internal Medicine

## 2022-08-05 ENCOUNTER — Encounter (HOSPITAL_COMMUNITY): Payer: Self-pay | Admitting: Internal Medicine

## 2022-08-05 ENCOUNTER — Ambulatory Visit (HOSPITAL_COMMUNITY)
Admission: RE | Admit: 2022-08-05 | Discharge: 2022-08-05 | Disposition: A | Discharge status: Home / Self Care | Payer: Medicare Other | Source: Ambulatory Visit | Attending: Internal Medicine | Admitting: Internal Medicine

## 2022-08-05 ENCOUNTER — Ambulatory Visit (HOSPITAL_COMMUNITY): Payer: Self-pay | Admitting: Pharmacist

## 2022-08-05 ENCOUNTER — Ambulatory Visit (HOSPITAL_COMMUNITY): Payer: Medicare Other

## 2022-08-05 VITALS — BP 125/68 | HR 89 | Wt 209.2 lb

## 2022-08-05 DIAGNOSIS — Z9889 Other specified postprocedural states: Secondary | ICD-10-CM | POA: Diagnosis not present

## 2022-08-05 DIAGNOSIS — I2510 Atherosclerotic heart disease of native coronary artery without angina pectoris: Secondary | ICD-10-CM

## 2022-08-05 DIAGNOSIS — C3412 Malignant neoplasm of upper lobe, left bronchus or lung: Secondary | ICD-10-CM | POA: Insufficient documentation

## 2022-08-05 DIAGNOSIS — I2102 ST elevation (STEMI) myocardial infarction involving left anterior descending coronary artery: Secondary | ICD-10-CM

## 2022-08-05 DIAGNOSIS — J90 Pleural effusion, not elsewhere classified: Secondary | ICD-10-CM | POA: Insufficient documentation

## 2022-08-05 DIAGNOSIS — Z87891 Personal history of nicotine dependence: Secondary | ICD-10-CM | POA: Insufficient documentation

## 2022-08-05 DIAGNOSIS — I110 Hypertensive heart disease with heart failure: Secondary | ICD-10-CM | POA: Insufficient documentation

## 2022-08-05 DIAGNOSIS — Z7901 Long term (current) use of anticoagulants: Secondary | ICD-10-CM

## 2022-08-05 DIAGNOSIS — Z79899 Other long term (current) drug therapy: Secondary | ICD-10-CM | POA: Insufficient documentation

## 2022-08-05 DIAGNOSIS — I5022 Chronic systolic (congestive) heart failure: Secondary | ICD-10-CM

## 2022-08-05 DIAGNOSIS — Z95811 Presence of heart assist device: Secondary | ICD-10-CM

## 2022-08-05 DIAGNOSIS — I252 Old myocardial infarction: Secondary | ICD-10-CM | POA: Diagnosis not present

## 2022-08-05 DIAGNOSIS — Z953 Presence of xenogenic heart valve: Secondary | ICD-10-CM | POA: Insufficient documentation

## 2022-08-05 DIAGNOSIS — Z952 Presence of prosthetic heart valve: Secondary | ICD-10-CM

## 2022-08-05 DIAGNOSIS — Z955 Presence of coronary angioplasty implant and graft: Secondary | ICD-10-CM

## 2022-08-05 DIAGNOSIS — I480 Paroxysmal atrial fibrillation: Secondary | ICD-10-CM | POA: Diagnosis not present

## 2022-08-05 DIAGNOSIS — Z8616 Personal history of COVID-19: Secondary | ICD-10-CM | POA: Diagnosis not present

## 2022-08-05 DIAGNOSIS — M459 Ankylosing spondylitis of unspecified sites in spine: Secondary | ICD-10-CM | POA: Insufficient documentation

## 2022-08-05 DIAGNOSIS — I5042 Chronic combined systolic (congestive) and diastolic (congestive) heart failure: Secondary | ICD-10-CM

## 2022-08-05 LAB — CBC
HCT: 38 % — ABNORMAL LOW (ref 39.0–52.0)
Hemoglobin: 12.1 g/dL — ABNORMAL LOW (ref 13.0–17.0)
MCH: 26.6 pg (ref 26.0–34.0)
MCHC: 31.8 g/dL (ref 30.0–36.0)
MCV: 83.5 fL (ref 80.0–100.0)
Platelets: 235 10*3/uL (ref 150–400)
RBC: 4.55 MIL/uL (ref 4.22–5.81)
RDW: 17.6 % — ABNORMAL HIGH (ref 11.5–15.5)
WBC: 8 10*3/uL (ref 4.0–10.5)
nRBC: 0 % (ref 0.0–0.2)

## 2022-08-05 LAB — BASIC METABOLIC PANEL
Anion gap: 11 (ref 5–15)
BUN: 13 mg/dL (ref 8–23)
CO2: 26 mmol/L (ref 22–32)
Calcium: 9 mg/dL (ref 8.9–10.3)
Chloride: 99 mmol/L (ref 98–111)
Creatinine, Ser: 0.97 mg/dL (ref 0.61–1.24)
GFR, Estimated: >60 mL/min (ref >60)
Glucose, Bld: 125 mg/dL — ABNORMAL HIGH (ref 70–99)
Potassium: 4.5 mmol/L (ref 3.5–5.1)
Sodium: 136 mmol/L (ref 135–145)

## 2022-08-05 LAB — PROTIME-INR
INR: 4.1 (ref 0.8–1.2)
Prothrombin Time: 39.2 seconds — ABNORMAL HIGH (ref 11.4–15.2)

## 2022-08-05 LAB — LACTATE DEHYDROGENASE: LDH: 154 U/L (ref 98–192)

## 2022-08-05 NOTE — Progress Notes (Addendum)
Patient presents for 2 month f/u in [facility] today alone. Reports no problems with VAD equipment or concerns with drive line.  Patient says he is feeling "great" and has no limitations with activities. He is participating in cardiac rehab twice a week. He is really enjoying this. Denies shortness of breath, heart failure symptoms, falls, and signs of bleeding. Reports intermittent lightheadedness/dizziness if he stands up quickly, or bends over for a prolonged period of time. These episodes quickly resolve with rest and hydration.   Reports at the beginning of March he experienced 2 weeks of dark/black stool. CBC checked at that time- Hgb stable. Reports when he started Clindamycin for dental infection dark stools resolved.   Has 2 more days worth of Clindamycin to complete course for recent dental infection. Reports he is currently taking Prednisone eye drops per his opthamologist.   Pt starting SBRT treatments tomorrow. He will go for a total of 5 treatments. Pt states he is in good spirits regarding his cancer treatment, and hopes this will be curative.   Reports since restarting Zoloft in December he has noticed he is grinding his teeth more. Discussed decreasing Zoloft with Dr [name]. Will continue at current dose for now with start of SBRT treatment and recent loss of a friend. Will readdress decreasing dose at next visit. Pt in agreement with this plan.   Vital Signs:    Doppler Pressure: 92 Automatc BP: 125/68 (92) HR: 89    SPO2:  100% on RA   Weight: 209.2 w/ ept Last weight: 208.4 lb w/ eqt Ht: 6'   VAD Interrogation: Speed: 5800 Flow: 4.5 Power: 4.6w    PI: 4.7 Alarms: none Events: 40 - 60 PI events daily  Fixed speed: 5800 Low speed limit: 5500  Primary controller: back up battery due for replacement in 17 months Secondary controller:  back up battery due for replacement in 20 months    I reviewed the LVAD parameters from today and compared the results to the  patient's prior recorded data. LVAD interrogation was NEGATIVE for significant power changes, NEGATIVE for clinical alarms and STABLE for PI events/speed drops. No programming changes were made and pump is functioning within specified parameters. Pt is performing daily controller and system monitor self tests along with completing weekly and monthly maintenance for LVAD equipment.   LVAD equipment check completed and is in good working order. Back-up equipment  present.    Annual Equipment Maintenance on UBC/PM was performed on 06/03/22.   Exit Site Care: Dressing CDI. No redness, tenderness, drainage or odor reported by patient. Will continue weekly dressings.  Provided patient with 8 weekly kits.  Device: N/A  (removed due to infection 12/04/20)   BP & Labs:  Doppler 92 - Doppler is reflecting modified systolic   Hgb [id number] - No S/S of bleeding. Specifically denies melena/BRBPR or nosebleeds.   LDH stable at 154 with established baseline of 135 - 309. Denies tea-colored urine. No power elevations noted on interrogation.    Patient Instructions:  No medication changes today Coumadin dosing per Lauren PharmD Return to [facility] clinic in 2 months. We will complete your 1.5 year Intermacs at this visit. Please wear tennis shoes for your 6 minute walk.   Emerson Monte RN [place] Coordinator  Office: [phone]  24/7 Pager: [phone]

## 2022-08-05 NOTE — Patient Instructions (Signed)
No medication changes today Coumadin dosing per Lauren PharmD Return to [place] [facility] in 2 months. We will complete your 1.5 year Intermacs at this visit. Please wear tennis shoes for your 6 minute walk.

## 2022-08-06 ENCOUNTER — Other Ambulatory Visit: Payer: Self-pay

## 2022-08-06 ENCOUNTER — Ambulatory Visit
Admission: RE | Admit: 2022-08-06 | Discharge: 2022-08-06 | Disposition: A | Discharge status: Home / Self Care | Payer: Medicare Other | Source: Ambulatory Visit | Attending: Radiation Oncology | Admitting: Radiation Oncology

## 2022-08-06 DIAGNOSIS — Z510 Encounter for antineoplastic radiation therapy: Secondary | ICD-10-CM | POA: Diagnosis not present

## 2022-08-06 DIAGNOSIS — R911 Solitary pulmonary nodule: Secondary | ICD-10-CM

## 2022-08-06 LAB — RAD ONC ARIA SESSION SUMMARY
Course Elapsed Days: 0
Plan Fractions Treated to Date: 1
Plan Prescribed Dose Per Fraction: 10 Gy
Plan Total Fractions Prescribed: 5
Plan Total Prescribed Dose: 50 Gy
Reference Point Dosage Given to Date: 10 Gy
Reference Point Session Dosage Given: 10 Gy
Session Number: 1

## 2022-08-07 ENCOUNTER — Ambulatory Visit: Payer: Medicare Other

## 2022-08-07 ENCOUNTER — Encounter (HOSPITAL_COMMUNITY)
Admission: RE | Admit: 2022-08-07 | Discharge: 2022-08-07 | Disposition: A | Discharge status: Home / Self Care | Payer: Medicare Other | Source: Ambulatory Visit | Attending: Internal Medicine | Admitting: Internal Medicine

## 2022-08-07 ENCOUNTER — Other Ambulatory Visit (HOSPITAL_COMMUNITY): Payer: Self-pay

## 2022-08-07 ENCOUNTER — Ambulatory Visit (HOSPITAL_COMMUNITY): Payer: Medicare Other

## 2022-08-07 DIAGNOSIS — Z95811 Presence of heart assist device: Secondary | ICD-10-CM

## 2022-08-07 DIAGNOSIS — I2102 ST elevation (STEMI) myocardial infarction involving left anterior descending coronary artery: Secondary | ICD-10-CM | POA: Diagnosis not present

## 2022-08-07 DIAGNOSIS — Z952 Presence of prosthetic heart valve: Secondary | ICD-10-CM

## 2022-08-07 DIAGNOSIS — Z955 Presence of coronary angioplasty implant and graft: Secondary | ICD-10-CM

## 2022-08-07 DIAGNOSIS — I5042 Chronic combined systolic (congestive) and diastolic (congestive) heart failure: Secondary | ICD-10-CM

## 2022-08-07 NOTE — Progress Notes (Signed)
Patient had a 12 beat run of WCT rate 120's. Patient asymptomatic. MAP 84. LVAD coordinator notified. Spoke with Minette Brine. No new order's received.Will continue to monitor the patient throughout  the [place] RN BSN

## 2022-08-08 ENCOUNTER — Ambulatory Visit
Admission: RE | Admit: 2022-08-08 | Discharge: 2022-08-08 | Disposition: A | Discharge status: Home / Self Care | Payer: Medicare Other | Source: Ambulatory Visit | Attending: Radiation Oncology | Admitting: Radiation Oncology

## 2022-08-08 ENCOUNTER — Other Ambulatory Visit: Payer: Self-pay

## 2022-08-08 ENCOUNTER — Ambulatory Visit (HOSPITAL_COMMUNITY): Payer: Self-pay | Admitting: Pharmacist

## 2022-08-08 DIAGNOSIS — Z510 Encounter for antineoplastic radiation therapy: Secondary | ICD-10-CM | POA: Diagnosis not present

## 2022-08-08 DIAGNOSIS — R911 Solitary pulmonary nodule: Secondary | ICD-10-CM

## 2022-08-08 LAB — RAD ONC ARIA SESSION SUMMARY
Course Elapsed Days: 2
Plan Fractions Treated to Date: 2
Plan Prescribed Dose Per Fraction: 10 Gy
Plan Total Fractions Prescribed: 5
Plan Total Prescribed Dose: 50 Gy
Reference Point Dosage Given to Date: 20 Gy
Reference Point Session Dosage Given: 10 Gy
Session Number: 2

## 2022-08-08 LAB — POCT INR: INR: 2.4 (ref 2.0–3.0)

## 2022-08-09 ENCOUNTER — Ambulatory Visit (HOSPITAL_COMMUNITY): Payer: Medicare Other

## 2022-08-09 ENCOUNTER — Encounter (HOSPITAL_COMMUNITY)
Admission: RE | Admit: 2022-08-09 | Discharge: 2022-08-09 | Disposition: A | Discharge status: Home / Self Care | Payer: Medicare Other | Source: Ambulatory Visit | Attending: Internal Medicine | Admitting: Internal Medicine

## 2022-08-09 DIAGNOSIS — I2102 ST elevation (STEMI) myocardial infarction involving left anterior descending coronary artery: Secondary | ICD-10-CM | POA: Diagnosis not present

## 2022-08-12 ENCOUNTER — Ambulatory Visit (HOSPITAL_COMMUNITY): Payer: Medicare Other

## 2022-08-12 ENCOUNTER — Encounter (HOSPITAL_COMMUNITY)
Admission: RE | Admit: 2022-08-12 | Discharge: 2022-08-12 | Disposition: A | Discharge status: Home / Self Care | Payer: Medicare Other | Source: Ambulatory Visit | Attending: Internal Medicine | Admitting: Internal Medicine

## 2022-08-12 ENCOUNTER — Ambulatory Visit
Admission: RE | Admit: 2022-08-12 | Discharge: 2022-08-12 | Disposition: A | Discharge status: Home / Self Care | Payer: Medicare Other | Source: Ambulatory Visit | Attending: Radiation Oncology | Admitting: Radiation Oncology

## 2022-08-12 ENCOUNTER — Other Ambulatory Visit: Payer: Self-pay

## 2022-08-12 DIAGNOSIS — I2102 ST elevation (STEMI) myocardial infarction involving left anterior descending coronary artery: Secondary | ICD-10-CM | POA: Diagnosis present

## 2022-08-12 DIAGNOSIS — Z7901 Long term (current) use of anticoagulants: Secondary | ICD-10-CM | POA: Diagnosis present

## 2022-08-12 DIAGNOSIS — Z952 Presence of prosthetic heart valve: Secondary | ICD-10-CM | POA: Diagnosis not present

## 2022-08-12 DIAGNOSIS — I5042 Chronic combined systolic (congestive) and diastolic (congestive) heart failure: Secondary | ICD-10-CM | POA: Diagnosis not present

## 2022-08-12 DIAGNOSIS — Z48812 Encounter for surgical aftercare following surgery on the circulatory system: Secondary | ICD-10-CM | POA: Insufficient documentation

## 2022-08-12 DIAGNOSIS — Z955 Presence of coronary angioplasty implant and graft: Secondary | ICD-10-CM | POA: Diagnosis not present

## 2022-08-12 DIAGNOSIS — I252 Old myocardial infarction: Secondary | ICD-10-CM | POA: Insufficient documentation

## 2022-08-12 DIAGNOSIS — Z95811 Presence of heart assist device: Secondary | ICD-10-CM | POA: Insufficient documentation

## 2022-08-12 DIAGNOSIS — C3412 Malignant neoplasm of upper lobe, left bronchus or lung: Secondary | ICD-10-CM | POA: Insufficient documentation

## 2022-08-12 DIAGNOSIS — R911 Solitary pulmonary nodule: Secondary | ICD-10-CM | POA: Insufficient documentation

## 2022-08-12 LAB — RAD ONC ARIA SESSION SUMMARY
Course Elapsed Days: 6
Plan Fractions Treated to Date: 3
Plan Prescribed Dose Per Fraction: 10 Gy
Plan Total Fractions Prescribed: 5
Plan Total Prescribed Dose: 50 Gy
Reference Point Dosage Given to Date: 30 Gy
Reference Point Session Dosage Given: 10 Gy
Session Number: 3

## 2022-08-14 ENCOUNTER — Encounter (HOSPITAL_COMMUNITY)
Admission: RE | Admit: 2022-08-14 | Discharge: 2022-08-14 | Disposition: A | Discharge status: Home / Self Care | Payer: Medicare Other | Source: Ambulatory Visit | Attending: Internal Medicine | Admitting: Internal Medicine

## 2022-08-14 ENCOUNTER — Ambulatory Visit (HOSPITAL_COMMUNITY)
Admission: RE | Admit: 2022-08-14 | Discharge: 2022-08-14 | Disposition: A | Discharge status: Home / Self Care | Payer: Medicare Other | Source: Ambulatory Visit | Attending: Cardiology | Admitting: Cardiology

## 2022-08-14 ENCOUNTER — Ambulatory Visit
Admission: RE | Admit: 2022-08-14 | Discharge: 2022-08-14 | Disposition: A | Discharge status: Home / Self Care | Payer: Medicare Other | Source: Ambulatory Visit | Attending: Radiation Oncology | Admitting: Radiation Oncology

## 2022-08-14 ENCOUNTER — Other Ambulatory Visit: Payer: Self-pay

## 2022-08-14 ENCOUNTER — Encounter (HOSPITAL_COMMUNITY): Payer: Self-pay

## 2022-08-14 ENCOUNTER — Ambulatory Visit: Payer: Medicare Other

## 2022-08-14 ENCOUNTER — Ambulatory Visit (HOSPITAL_COMMUNITY): Payer: Medicare Other

## 2022-08-14 DIAGNOSIS — I2102 ST elevation (STEMI) myocardial infarction involving left anterior descending coronary artery: Secondary | ICD-10-CM | POA: Diagnosis not present

## 2022-08-14 DIAGNOSIS — R42 Dizziness and giddiness: Secondary | ICD-10-CM | POA: Insufficient documentation

## 2022-08-14 DIAGNOSIS — Z95811 Presence of heart assist device: Secondary | ICD-10-CM

## 2022-08-14 DIAGNOSIS — R911 Solitary pulmonary nodule: Secondary | ICD-10-CM

## 2022-08-14 DIAGNOSIS — Z452 Encounter for adjustment and management of vascular access device: Secondary | ICD-10-CM | POA: Insufficient documentation

## 2022-08-14 DIAGNOSIS — I252 Old myocardial infarction: Secondary | ICD-10-CM | POA: Insufficient documentation

## 2022-08-14 DIAGNOSIS — Z7901 Long term (current) use of anticoagulants: Secondary | ICD-10-CM | POA: Insufficient documentation

## 2022-08-14 DIAGNOSIS — Z952 Presence of prosthetic heart valve: Secondary | ICD-10-CM | POA: Insufficient documentation

## 2022-08-14 LAB — RAD ONC ARIA SESSION SUMMARY
Course Elapsed Days: 8
Plan Fractions Treated to Date: 4
Plan Prescribed Dose Per Fraction: 10 Gy
Plan Total Fractions Prescribed: 5
Plan Total Prescribed Dose: 50 Gy
Reference Point Dosage Given to Date: 40 Gy
Reference Point Session Dosage Given: 10 Gy
Session Number: 4

## 2022-08-14 NOTE — Progress Notes (Signed)
Pt called office about ongoing dizziness and lightheadedness. He states he was trying to pick up sticks in his driveway yesterday and experienced extreme dizziness where he felt like he may pass out. Symptoms quickly resolved with rest. Pt reports mild dizziness for approximately 2 weeks. Pt reports only taking Torsemide once per week. Pt just received 4th SBRT treatment and reports no noted adverse effects from treatments. Orthostatic blood pressure taken.   Lying: 98/86 (92) Sitting: 114/95 (104) Standing: 99/72 (82) Doppler: 98  VAD interrogated parameters stable.   VAD Interrogation: Speed: 5800 Flow: 4.9 Power: 4.9w    PI: 4.5 Alarms: none Events: 40 - 60 PI events daily  Fixed speed: 5800 Low speed limit: 5500  Discussed with Dr. [name]. Pt advised to hold Torsemide and eat more salty foods. Pt verbalized understanding. Advised pt to page VAD Coordinator if symptoms worsen.  Bobbye Morton RN, BSN VAD Coordinator 24/7 Pager [phone]

## 2022-08-15 ENCOUNTER — Ambulatory Visit (HOSPITAL_COMMUNITY): Payer: Self-pay

## 2022-08-15 LAB — POCT INR: INR: 2.9 (ref 2.0–3.0)

## 2022-08-16 ENCOUNTER — Ambulatory Visit
Admission: RE | Admit: 2022-08-16 | Discharge: 2022-08-16 | Disposition: A | Discharge status: Home / Self Care | Payer: Medicare Other | Source: Ambulatory Visit | Attending: Radiation Oncology | Admitting: Radiation Oncology

## 2022-08-16 ENCOUNTER — Ambulatory Visit (HOSPITAL_COMMUNITY): Payer: Medicare Other

## 2022-08-16 ENCOUNTER — Other Ambulatory Visit: Payer: Self-pay

## 2022-08-16 ENCOUNTER — Encounter (HOSPITAL_COMMUNITY)
Admission: RE | Admit: 2022-08-16 | Discharge: 2022-08-16 | Disposition: A | Discharge status: Home / Self Care | Payer: Medicare Other | Source: Ambulatory Visit | Attending: Internal Medicine | Admitting: Internal Medicine

## 2022-08-16 DIAGNOSIS — Z955 Presence of coronary angioplasty implant and graft: Secondary | ICD-10-CM

## 2022-08-16 DIAGNOSIS — I2102 ST elevation (STEMI) myocardial infarction involving left anterior descending coronary artery: Secondary | ICD-10-CM

## 2022-08-16 DIAGNOSIS — Z952 Presence of prosthetic heart valve: Secondary | ICD-10-CM

## 2022-08-16 DIAGNOSIS — Z95811 Presence of heart assist device: Secondary | ICD-10-CM

## 2022-08-16 DIAGNOSIS — I5042 Chronic combined systolic (congestive) and diastolic (congestive) heart failure: Secondary | ICD-10-CM

## 2022-08-16 LAB — RAD ONC ARIA SESSION SUMMARY
Course Elapsed Days: 10
Plan Fractions Treated to Date: 5
Plan Prescribed Dose Per Fraction: 10 Gy
Plan Total Fractions Prescribed: 5
Plan Total Prescribed Dose: 50 Gy
Reference Point Dosage Given to Date: 50 Gy
Reference Point Session Dosage Given: 10 Gy
Session Number: 5

## 2022-08-16 NOTE — Progress Notes (Signed)
CARDIAC REHAB PHASE 2  Reviewed home exercise with pt today. Pt is tolerating exercise well. Pt will continue to exercise on his own by going to [employer] for 30-45 minutes per session 2-3 days a week in addition to the 3 days in [facility]. Advised pt on THRR, RPE scale, hydration and temperature/humidity precautions. Reinforced S/S to stop exercise and when to call MD vs 911. Encouraged warm up cool down and stretches with exercise sessions. Pt verbalized understanding, all questions were answered and pt was given a copy to take home.    Harrie Jeans ACSM-CEP 08/16/2022 4:45 PM

## 2022-08-19 ENCOUNTER — Encounter (HOSPITAL_COMMUNITY)
Admission: RE | Admit: 2022-08-19 | Discharge: 2022-08-19 | Disposition: A | Discharge status: Home / Self Care | Payer: Medicare Other | Source: Ambulatory Visit | Attending: Internal Medicine | Admitting: Internal Medicine

## 2022-08-19 ENCOUNTER — Ambulatory Visit (HOSPITAL_COMMUNITY): Payer: Medicare Other

## 2022-08-19 DIAGNOSIS — I2102 ST elevation (STEMI) myocardial infarction involving left anterior descending coronary artery: Secondary | ICD-10-CM

## 2022-08-19 DIAGNOSIS — Z95811 Presence of heart assist device: Secondary | ICD-10-CM

## 2022-08-19 DIAGNOSIS — Z955 Presence of coronary angioplasty implant and graft: Secondary | ICD-10-CM

## 2022-08-19 DIAGNOSIS — I5042 Chronic combined systolic (congestive) and diastolic (congestive) heart failure: Secondary | ICD-10-CM

## 2022-08-19 DIAGNOSIS — Z952 Presence of prosthetic heart valve: Secondary | ICD-10-CM

## 2022-08-19 NOTE — Radiation Completion Notes (Signed)
Patient Name: Brandon [name], Brandon [name] MRN: [id number] Date of Birth: 10-Aug-1955 Referring Physician: Charlett Lango, M.D. Date of Service: 2022-08-19 Radiation Oncologist: Arnette Schaumann, M.D. [facility]                             RADIATION ONCOLOGY END OF TREATMENT NOTE     Diagnosis: C34.12 Malignant neoplasm of upper lobe, left bronchus or lung Intent: Curative     ==========DELIVERED PLANS==========  First Treatment Date: 2022-08-06 - Last Treatment Date: 2022-08-16   Plan Name: Lung_L_SBRT Site: Lung, Left Technique: SBRT/SRT-IMRT Mode: Photon Dose Per Fraction: 10 Gy Prescribed Dose (Delivered / Prescribed): 50 Gy / 50 Gy Prescribed Fxs (Delivered / Prescribed): 5 / 5     ==========ON TREATMENT VISIT DATES========== 2022-08-06, 2022-08-08, 2022-08-12, 2022-08-14, 2022-08-14, 2022-08-16     ==========UPCOMING VISITS==========       ==========APPENDIX - ON TREATMENT VISIT NOTES==========   See weekly On Treatment Notes is Epic for details.

## 2022-08-21 ENCOUNTER — Ambulatory Visit (HOSPITAL_COMMUNITY): Payer: Medicare Other

## 2022-08-21 ENCOUNTER — Encounter (HOSPITAL_COMMUNITY)
Admission: RE | Admit: 2022-08-21 | Discharge: 2022-08-21 | Disposition: A | Discharge status: Home / Self Care | Payer: Medicare Other | Source: Ambulatory Visit | Attending: Internal Medicine | Admitting: Internal Medicine

## 2022-08-21 DIAGNOSIS — I2102 ST elevation (STEMI) myocardial infarction involving left anterior descending coronary artery: Secondary | ICD-10-CM | POA: Diagnosis not present

## 2022-08-22 ENCOUNTER — Ambulatory Visit (HOSPITAL_COMMUNITY): Payer: Self-pay

## 2022-08-22 ENCOUNTER — Other Ambulatory Visit (HOSPITAL_COMMUNITY): Payer: Self-pay | Admitting: Unknown Physician Specialty

## 2022-08-22 LAB — POCT INR: INR: 3.2 — AB (ref 2.0–3.0)

## 2022-08-22 MED ORDER — PANTOPRAZOLE SODIUM 40 MG PO TBEC: 40.0000 mg | DELAYED_RELEASE_TABLET | Freq: Two times a day (BID) | ORAL | 6 refills | Status: DC

## 2022-08-23 ENCOUNTER — Encounter (HOSPITAL_COMMUNITY)
Admission: RE | Admit: 2022-08-23 | Discharge: 2022-08-23 | Disposition: A | Discharge status: Home / Self Care | Payer: Medicare Other | Source: Ambulatory Visit | Attending: Internal Medicine | Admitting: Internal Medicine

## 2022-08-23 ENCOUNTER — Ambulatory Visit (HOSPITAL_COMMUNITY): Payer: Medicare Other

## 2022-08-23 DIAGNOSIS — I2102 ST elevation (STEMI) myocardial infarction involving left anterior descending coronary artery: Secondary | ICD-10-CM | POA: Diagnosis not present

## 2022-08-23 NOTE — Progress Notes (Signed)
Cardiac Individual Treatment Plan  Patient Details  Name: Brandon [name] MRN: [id number] Date of Birth: 11/02/1955 Referring Provider:   Flowsheet Row INTENSIVE CARDIAC REHAB ORIENT from 07/04/2022 in [facility]  Referring Provider Dr. Gala Romney       Initial Encounter Date:  Flowsheet Row INTENSIVE CARDIAC REHAB ORIENT from 07/04/2022 in [facility]  Date 07/04/22       Visit Diagnosis: 05/06/22 STEMI, Med TX, 04/22/22 VFIB  Patient's Home Medications on Admission:  Current Outpatient Medications:    acetaminophen (TYLENOL) 500 MG tablet, Take 500-1,000 mg by mouth every 6 (six) hours as needed (for pain)., Disp: , Rfl:    aspirin EC 81 MG tablet, Take 1 tablet (81 mg total) by mouth daily. Swallow whole., Disp: 90 tablet, Rfl: 1   atorvastatin (LIPITOR) 80 MG tablet, TAKE 1 TABLET BY MOUTH ONCE DAILY AT 6PM. (Patient taking differently: Take 80 mg by mouth at bedtime.), Disp: 90 tablet, Rfl: 3   Carboxymethylcellulose Sodium (ARTIFICIAL TEARS OP), Place 1 drop into both eyes daily as needed (for dryness or irritation)., Disp: , Rfl:    clindamycin (CLEOCIN) 300 MG capsule, Take 1 capsule (300 mg total) by mouth 3 (three) times daily. (Patient not taking: Reported on 08/14/2022), Disp: 30 capsule, Rfl: 0   eplerenone (INSPRA) 50 MG tablet, Take 1 tablet (50 mg total) by mouth in the morning., Disp: 30 tablet, Rfl: 6   ezetimibe (ZETIA) 10 MG tablet, TAKE ONE TABLET BY MOUTH DAILY, Disp: 90 tablet, Rfl: 3   gabapentin (NEURONTIN) 300 MG capsule, Take 1 capsule (300 mg total) by mouth 3 (three) times daily., Disp: 90 capsule, Rfl: 3   losartan (COZAAR) 25 MG tablet, Take 0.5 tablets (12.5 mg total) by mouth daily., Disp: 30 tablet, Rfl: 5   melatonin 5 MG TABS, Take 1 tablet (5 mg total) by mouth at bedtime. (Patient not taking: Reported on 07/15/2022), Disp: 30 tablet, Rfl:  6   metoprolol succinate (TOPROL-XL) 25 MG 24 hr tablet, Take 0.5 tablets (12.5 mg total) by mouth daily., Disp: 45 tablet, Rfl: 1   ondansetron (ZOFRAN) 4 MG tablet, Take 1 tablet (4 mg total) by mouth every 8 (eight) hours as needed for nausea or vomiting. (Patient not taking: Reported on 07/15/2022), Disp: 20 tablet, Rfl: 3   pantoprazole (PROTONIX) 40 MG tablet, Take 1 tablet (40 mg total) by mouth 2 (two) times daily., Disp: 60 tablet, Rfl: 6   potassium chloride SA (KLOR-CON M) 20 MEQ tablet, Take 1 tablet (20 mEq total) by mouth daily., Disp: 90 tablet, Rfl: 3   sertraline (ZOLOFT) 50 MG tablet, TAKE 1/2 TAB BY MOUTH DAILY FOR 1 WEEK, THEN INCREASE TO 1 TABLET DAILY., Disp: 90 tablet, Rfl: 3   tamsulosin (FLOMAX) 0.4 MG CAPS capsule, Take 1 capsule (0.4 mg total) by mouth daily., Disp: 90 capsule, Rfl: 3   torsemide (DEMADEX) 20 MG tablet, Take 1 tablet (20 mg total) by mouth as needed., Disp: 30 tablet, Rfl: 5   traMADol (ULTRAM) 50 MG tablet, TAKE 1-2 TABLETS BY MOUTH TWICE A DAY FOR 30 DAYS, Disp: 120 tablet, Rfl: 1   warfarin (COUMADIN) 2.5 MG tablet, Take 7.5 mg (3 tablets) by mouth daily or as instructed by LVAD clinic. (Patient taking differently: Take 2.5 mg by mouth daily at 4 PM. Take 5 mg (2 tablets) on Monday, and 7.5 mg (3 tablets) by mouth  daily all other days, or as instructed by LVAD clinic.), Disp: 270 tablet, Rfl: 1   ZYRTEC ALLERGY 10 MG tablet, Take 10 mg by mouth daily., Disp: , Rfl:   Past Medical History: Past Medical History:  Diagnosis Date   AICD (automatic cardioverter/defibrillator) present 08/31/2019   Ankylosing spondylitis    Arthritis    BENIGN PROSTATIC HYPERTROPHY 06/07/2008   CHF (congestive heart failure)    COLONIC POLYPS, HX OF 06/07/2008   Coronary artery disease    Depression    H/O hiatal hernia    Heart failure    HYPERLIPIDEMIA 06/07/2008   HYPERTENSION 06/07/2008   Myocardial infarction 2005   NSTEMI, s/p LAD stent   NEPHROLITHIASIS, HX OF  06/07/2008   STEMI (ST elevation myocardial infarction) 04/27/2019    Tobacco Use: Social History   Tobacco Use  Smoking Status Former   Packs/day: 1.00   Years: 40.00   Additional pack years: 0.00   Total pack years: 40.00   Types: Cigarettes   Quit date: 07/26/2019   Years since quitting: 3.0  Smokeless Tobacco Never  Tobacco Comments   1-1.5 pks per year x 40-45 yrs    Labs: Review Flowsheet  More data exists      Latest Ref Rng & Units 04/28/2021 05/04/2021 05/06/2022 05/07/2022 05/08/2022  Labs for ITP Cardiac and Pulmonary Rehab  PH, Arterial 7.35 - 7.45 - - 7.371  7.378  - -  PCO2 arterial 32 - 48 mmHg - - 31.4  36.5  - -  Bicarbonate 20.0 - 28.0 mmol/L - - 24.8  18.2  21.5  - -  TCO2 22 - 32 mmol/L - - 26  19  23   - -  Acid-base deficit 0.0 - 2.0 mmol/L - - 1.0  6.0  3.0  - -  O2 Saturation % 36.8  51.2  60  92  91  71.7  75.6     Capillary Blood Glucose: Lab Results  Component Value Date   GLUCAP 117 (H) 06/12/2022   GLUCAP 122 (H) 05/06/2022   GLUCAP 100 (H) 05/09/2021   GLUCAP 84 05/09/2021   GLUCAP 122 (H) 05/08/2021     Exercise Target Goals: Exercise Program Goal: Individual exercise prescription set using results from initial 6 min walk test and THRR while considering  patient's activity barriers and safety.   Exercise Prescription Goal: Initial exercise prescription builds to 30-45 minutes a day of aerobic activity, 2-3 days per week.  Home exercise guidelines will be given to patient during program as part of exercise prescription that the participant will acknowledge.  Activity Barriers & Risk Stratification:  Activity Barriers & Cardiac Risk Stratification - 07/04/22 1332       Activity Barriers & Cardiac Risk Stratification   Activity Barriers Joint Problems;Deconditioning;Arthritis;Back Problems;Shortness of Breath;Neck/Spine Problems;Balance Concerns    Cardiac Risk Stratification High             6 Minute Walk:  6 Minute Walk      Row Name 07/04/22 1323         6 Minute Walk   Phase Initial     Distance 1040 feet     Walk Time 6 minutes     # of Rest Breaks 0     MPH 1.97     METS 2.38     RPE 12     Perceived Dyspnea  1     VO2 Peak 8.32     Symptoms Yes (comment)  Comments bilateral hip pain 5/10 chronic. 5/10 back neck bain chronic. Relieved with rest. Rollator used midway through walk test due to unsteadiness     Resting HR 96 bpm     Resting BP 84/0  VAD-MAP     Resting Oxygen Saturation  97 %     Exercise Oxygen Saturation  during 6 min walk 95 %     Max Ex. HR 125 bpm     Max Ex. BP 86/0  VAD-MAP     2 Minute Post BP 80/0  VAD-MAP              Oxygen Initial Assessment:   Oxygen Re-Evaluation:   Oxygen Discharge (Final Oxygen Re-Evaluation):   Initial Exercise Prescription:  Initial Exercise Prescription - 07/04/22 1300       Date of Initial Exercise RX and Referring Provider   Date 07/04/22    Referring Provider Dr. Gala Romney    Expected Discharge Date 09/13/22      Recumbant Bike   Level 1    RPM 65    Minutes 15    METs 1.8      NuStep   Level 1    SPM 85    Minutes 15    METs 1.8      Prescription Details   Frequency (times per week) 3    Duration Progress to 30 minutes of continuous aerobic without signs/symptoms of physical distress      Intensity   THRR 40-80% of Max Heartrate 61-122    Ratings of Perceived Exertion 11-13    Perceived Dyspnea 0-4      Progression   Progression Continue progressive overload as per policy without signs/symptoms or physical distress.      Resistance Training   Training Prescription Yes    Weight 3 lbs    Reps 10-15             Perform Capillary Blood Glucose checks as needed.  Exercise Prescription Changes:   Exercise Prescription Changes     Row Name 07/04/22 1300 07/10/22 1630 07/31/22 1637 08/16/22 1635       Response to Exercise   Blood Pressure (Admit) 84/0 82/0  VAD-MAP 84/0  VAD-MAP 84/0   VAD-MAP    Blood Pressure (Exercise) -- 84/0 90/0 94/0    Blood Pressure (Exit) -- 84/0 84/0 82/0    Heart Rate (Admit) -- 118 bpm 86 bpm 104 bpm    Heart Rate (Exercise) -- 116 bpm 112 bpm 119 bpm    Heart Rate (Exit) -- 116 bpm 87 bpm 90 bpm    Rating of Perceived Exertion (Exercise) -- 11 11 11.75    Perceived Dyspnea (Exercise) -- 0 0 0    Symptoms -- Some ECG abnormalities, see nurse note Some ECG abnormalities, see nurse note Some ECG abnormalities, see nurse note    Comments -- Pt first day in the CRP2 program Reviewed MET's and goals Reviewed MEts and home ExRx    Duration -- Progress to 30 minutes of  aerobic without signs/symptoms of physical distress Progress to 30 minutes of  aerobic without signs/symptoms of physical distress Progress to 30 minutes of  aerobic without signs/symptoms of physical distress    Intensity -- THRR unchanged THRR unchanged THRR unchanged      Progression   Progression -- Continue to progress workloads to maintain intensity without signs/symptoms of physical distress. Continue to progress workloads to maintain intensity without signs/symptoms of physical distress. Continue to progress workloads to  maintain intensity without signs/symptoms of physical distress.    Average METs -- 3.4 2.75 2.95      Resistance Training   Training Prescription -- No No Yes    Weight -- -- -- 3 lbs wts    Reps -- -- -- 10-15    Time -- -- -- 10 Minutes      Recumbant Bike   Level -- 1 1 1.5    RPM -- 75 66 72    Watts -- 17 15 24     Minutes -- 15 15 15     METs -- 2.2 2 2.6      NuStep   Level -- 1 3 3     SPM -- 123 134 121    Minutes -- 15 15 15     METs -- 3.4 3.5 3.3      Home Exercise Plan   Plans to continue exercise at -- -- -- [employer] (comment)    Frequency -- -- -- Add 2 additional days to program exercise sessions.    Initial Home Exercises Provided -- -- -- 08/16/22             Exercise Comments:   Exercise Comments     Row Name  07/10/22 1638 07/31/22 1645 08/16/22 1644       Exercise Comments Pt first day in the CRP2 program. Pt tolerated exercise well with an average MET level of 2.8. See nurse note about some ECG abnormalities. Pt did good on his first day and and is learning his THRR, RPE and ExRx Reviewed MET's and goals. Pt tolerated exercise well with an average MET level of 2.75. See nurse note about some ECG abnormalities. Pt is feeling good about his goals so far and feels like he is doing good with his diet and adding in more variety. Has still had some reaccuring SOB and lightheadness. But he has found he recovers more quickly. Nurse aware. Will review home ExRx after evaluation of symptoms Reviewed MET's and home ExRx. Pt tolerated exercise well with an average MET level of 2.95. Pt will continue to exercise by going to [employer] 2-3 days a week. Reviewed gradual weight progression and monitoring for symptoms.              Exercise Goals and Review:   Exercise Goals     Row Name 07/04/22 1347             Exercise Goals   Increase Physical Activity Yes       Intervention Provide advice, education, support and counseling about physical activity/exercise needs.;Develop an individualized exercise prescription for aerobic and resistive training based on initial evaluation findings, risk stratification, comorbidities and participant's personal goals.       Expected Outcomes Short Term: Attend rehab on a regular basis to increase amount of physical activity.;Long Term: Add in home exercise to make exercise part of routine and to increase amount of physical activity.;Long Term: Exercising regularly at least 3-5 days a week.       Able to understand and use rate of perceived exertion (RPE) scale Yes       Intervention Provide education and explanation on how to use RPE scale       Expected Outcomes Short Term: Able to use RPE daily in rehab to express subjective intensity level;Long Term:  Able to use  RPE to guide intensity level when exercising independently       Knowledge and understanding of Target Heart Rate Range (THRR)  Yes       Intervention Provide education and explanation of THRR including how the numbers were predicted and where they are located for reference       Expected Outcomes Short Term: Able to state/look up THRR;Long Term: Able to use THRR to govern intensity when exercising independently;Short Term: Able to use daily as guideline for intensity in rehab       Understanding of Exercise Prescription Yes       Intervention Provide education, explanation, and written materials on patient's individual exercise prescription       Expected Outcomes Short Term: Able to explain program exercise prescription;Long Term: Able to explain home exercise prescription to exercise independently                Exercise Goals Re-Evaluation :  Exercise Goals Re-Evaluation     Row Name 07/10/22 1635 07/31/22 1642 08/16/22 1639         Exercise Goal Re-Evaluation   Exercise Goals Review Increase Physical Activity;Understanding of Exercise Prescription;Increase Strength and Stamina;Knowledge and understanding of Target Heart Rate Range (THRR);Able to understand and use rate of perceived exertion (RPE) scale Increase Physical Activity;Understanding of Exercise Prescription;Increase Strength and Stamina;Knowledge and understanding of Target Heart Rate Range (THRR);Able to understand and use rate of perceived exertion (RPE) scale Increase Physical Activity;Understanding of Exercise Prescription;Increase Strength and Stamina;Knowledge and understanding of Target Heart Rate Range (THRR);Able to understand and use rate of perceived exertion (RPE) scale     Comments Pt first day in the CRP2 program. Pt tolerated exercise well with an average MET level of 2.8. See nurse note about some ECG abnormalities. Pt did good on his first day and and is learning his THRR, RPE and ExRx Reviewed MET's and goals.  Pt tolerated exercise well with an average MET level of 2.75. See nurse note about some ECG abnormalities. Pt is feeling good about his goals so far and feels like he is doing good with his diet and adding in more variety. Has still had some reaccuring SOB and lightheadness. But he has found he recovers more quickly. Nurse aware. Will review home ExRx after evaluation of symptoms Reviewed MET's and home ExRx. Pt tolerated exercise well with an average MET level of 2.95. Pt will continue to exercise by going to [employer] 2-3 days a week. Reviewed gradual weight progression and monitoring for symptoms.     Expected Outcomes Will continue to monitor pt and progress workloads as tolerated without sign or symptom Will continue to monitor pt and progress workloads as tolerated without sign or symptom Will continue to monitor pt and progress workloads as tolerated without sign or symptom              Discharge Exercise Prescription (Final Exercise Prescription Changes):  Exercise Prescription Changes - 08/16/22 1635       Response to Exercise   Blood Pressure (Admit) 84/0   VAD-MAP   Blood Pressure (Exercise) 94/0    Blood Pressure (Exit) 82/0    Heart Rate (Admit) 104 bpm    Heart Rate (Exercise) 119 bpm    Heart Rate (Exit) 90 bpm    Rating of Perceived Exertion (Exercise) 11.75    Perceived Dyspnea (Exercise) 0    Symptoms Some ECG abnormalities, see nurse note    Comments Reviewed MEts and home ExRx    Duration Progress to 30 minutes of  aerobic without signs/symptoms of physical distress    Intensity THRR unchanged  Progression   Progression Continue to progress workloads to maintain intensity without signs/symptoms of physical distress.    Average METs 2.95      Resistance Training   Training Prescription Yes    Weight 3 lbs wts    Reps 10-15    Time 10 Minutes      Recumbant Bike   Level 1.5    RPM 72    Watts 24    Minutes 15    METs 2.6      NuStep   Level 3     SPM 121    Minutes 15    METs 3.3      Home Exercise Plan   Plans to continue exercise at [employer] (comment)    Frequency Add 2 additional days to program exercise sessions.    Initial Home Exercises Provided 08/16/22             Nutrition:  Target Goals: Understanding of nutrition guidelines, daily intake of sodium 1500mg , cholesterol 200mg , calories 30% from fat and 7% or less from saturated fats, daily to have 5 or more servings of fruits and vegetables.  Biometrics:  Pre Biometrics - 07/10/22 1642       Pre Biometrics   Height 6' (1.829 m)    Weight 96 kg    Waist Circumference 42 inches    Hip Circumference 41 inches    Waist to Hip Ratio 1.02 %    BMI (Calculated) 28.7    Triceps Skinfold 6 mm    % Body Fat 24.8 %    Grip Strength 43 kg    Flexibility --   Pt unable to reach   Single Leg Stand 11 seconds              Nutrition Therapy Plan and Nutrition Goals:  Nutrition Therapy & Goals - 08/07/22 1123       Nutrition Therapy   Diet Heart Healthy Diet    Drug/Food Interactions Statins/Certain Fruits;Coumadin/Vit K      Personal Nutrition Goals   Nutrition Goal Patient to identify strategies for reducing cardiovascular risk by attending the weekly [employer] education and nutrition series    Personal Goal #2 Patient to improve diet quality by using the plate method as a daily guide for meal planning to include lean protein/plant protein, fruits, vegetables, whole grains, nonfat dairy as part of well balanced diet    Personal Goal #3 Patient to reduce sodium to 1500mg  per day    Comments Goals in action. Brandon [name] continues to attend the [employer] and nutrition series regularly. He continues a wide variety of foods including lean protein, fruits, vegetables, and whole grains. He has better understanding of reading food labels for sodium and sugar. Brandon [name] enjoys cooking and continues to work toward high fiber intake and mindfulness of  saturated fat intake. He continues treatment/follow-up for lung nodule with radiation oncology. He has maintained his weight since starting with our program. Brandon [name] will continue to benefit from participation in intensive cardiac rehab for nutrition, exercise, and lifestyle modification.      Intervention Plan   Intervention Prescribe, educate and counsel regarding individualized specific dietary modifications aiming towards targeted core components such as weight, hypertension, lipid management, diabetes, heart failure and other comorbidities.;Nutrition handout(s) given to patient.    Expected Outcomes Short Term Goal: Understand basic principles of dietary content, such as calories, fat, sodium, cholesterol and nutrients.;Long Term Goal: Adherence to prescribed nutrition plan.  Nutrition Assessments:  Nutrition Assessments - 07/04/22 1600       Rate Your Plate Scores   Pre Score 51            MEDIFICTS Score Key: ?70 Need to make dietary changes  40-70 Heart Healthy Diet ? 40 Therapeutic Level Cholesterol Diet   Flowsheet Row INTENSIVE CARDIAC REHAB ORIENT from 07/04/2022 in [facility]  Picture Your Plate Total Score on Admission 51      Picture Your Plate Scores: <16 Unhealthy dietary pattern with much room for improvement. 41-50 Dietary pattern unlikely to meet recommendations for good health and room for improvement. 51-60 More healthful dietary pattern, with some room for improvement.  >60 Healthy dietary pattern, although there may be some specific behaviors that could be improved.    Nutrition Goals Re-Evaluation:  Nutrition Goals Re-Evaluation     Row Name 07/10/22 1612 08/07/22 1123           Goals   Current Weight 211 lb 10.3 oz (96 kg) 211 lb 6.7 oz (95.9 kg)      Comment continues regular follow-up with anti-coagulation clinic hemoglobin 12.1; continues regular follow-up with  anti-coagulation clinic      Expected Outcome Brandon [name] has previously completed pulmonary and cardiac rehab. Brandon [name] enjoys cooking and continues to work toward high fiber intake and mindfulness of saturated fat intake. Brandon [name] will continue to benefit from participation in intensive cardiac rehab for nutrition, exercise, and lifestyle modification. Goals in action. Brandon [name] continues to attend the [employer] and nutrition series regularly. He continues a wide variety of foods including lean protein, fruits, vegetables, and whole grains. He has better understanding of reading food labels for sodium and sugar. Brandon [name] enjoys cooking and continues to work toward high fiber intake and mindfulness of saturated fat intake. He continues treatment/follow-up for lung nodule with radiation oncology. He has maintained his weight since starting with our program. Kolbi will continue to benefit from participation in intensive cardiac rehab for nutrition, exercise, and lifestyle modification.               Nutrition Goals Re-Evaluation:  Nutrition Goals Re-Evaluation     Row Name 07/10/22 1612 08/07/22 1123           Goals   Current Weight 211 lb 10.3 oz (96 kg) 211 lb 6.7 oz (95.9 kg)      Comment continues regular follow-up with anti-coagulation clinic hemoglobin 12.1; continues regular follow-up with anti-coagulation clinic      Expected Outcome Brandon [name] has previously completed pulmonary and cardiac rehab. Brandon [name] enjoys cooking and continues to work toward high fiber intake and mindfulness of saturated fat intake. Brandon [name] will continue to benefit from participation in intensive cardiac rehab for nutrition, exercise, and lifestyle modification. Goals in action. Elzia continues to attend the [employer] and nutrition series regularly. He continues a wide variety of foods including lean protein, fruits, vegetables, and whole grains. He has better understanding of reading food labels for sodium and sugar. Revanth  enjoys cooking and continues to work toward high fiber intake and mindfulness of saturated fat intake. He continues treatment/follow-up for lung nodule with radiation oncology. He has maintained his weight since starting with our program. Maleik will continue to benefit from participation in intensive cardiac rehab for nutrition, exercise, and lifestyle modification.               Nutrition Goals Discharge (Final Nutrition Goals Re-Evaluation):  Nutrition Goals  Re-Evaluation - 08/07/22 1123       Goals   Current Weight 211 lb 6.7 oz (95.9 kg)    Comment hemoglobin 12.1; continues regular follow-up with anti-coagulation clinic    Expected Outcome Goals in action. Brandon [name] continues to attend the [employer] and nutrition series regularly. He continues a wide variety of foods including lean protein, fruits, vegetables, and whole grains. He has better understanding of reading food labels for sodium and sugar. Brandon [name] enjoys cooking and continues to work toward high fiber intake and mindfulness of saturated fat intake. He continues treatment/follow-up for lung nodule with radiation oncology. He has maintained his weight since starting with our program. Brandon [name] will continue to benefit from participation in intensive cardiac rehab for nutrition, exercise, and lifestyle modification.             Psychosocial: Target Goals: Acknowledge presence or absence of significant depression and/or stress, maximize coping skills, provide positive support system. Participant is able to verbalize types and ability to use techniques and skills needed for reducing stress and depression.  Initial Review & Psychosocial Screening:  Initial Psych Review & Screening - 07/04/22 1348       Initial Review   Current issues with Current Stress Concerns;History of Depression;Current Anxiety/Panic    Source of Stress Concerns Chronic Illness    Comments Brandon [name] has a history of depression currently taking Zoloft. Brandon [name]  says he is grinding his teeth at night. Wants to discuss weaning off Zoloft. Brandon [name] admits to experiencing some anxiety since his two hospitaliztoins in December.      Family Dynamics   Good Support System? Yes   Jasaun lives alone he has a sister who lives in the area for support     Barriers   Psychosocial barriers to participate in program The patient should benefit from training in stress management and relaxation.      Screening Interventions   Interventions Encouraged to exercise    Expected Outcomes Long Term Goal: Stressors or current issues are controlled or eliminated.;Short Term goal: Identification and review with participant of any Quality of Life or Depression concerns found by scoring the questionnaire.             Quality of Life Scores:  Quality of Life - 07/04/22 1349       Quality of Life   Select Quality of Life      Quality of Life Scores   Health/Function Pre 18.7 %    Socioeconomic Pre 20.86 %    Psych/Spiritual Pre 16.57 %    Family Pre 17 %    GLOBAL Pre 18.5 %            Scores of 19 and below usually indicate a poorer quality of life in these areas.  A difference of  2-3 points is a clinically meaningful difference.  A difference of 2-3 points in the total score of the Quality of Life Index has been associated with significant improvement in overall quality of life, self-image, physical symptoms, and general health in studies assessing change in quality of life.  PHQ-9: Review Flowsheet  More data exists      07/26/2022 07/15/2022 07/04/2022 02/22/2022 11/02/2021  Depression screen PHQ 2/9  Decreased Interest 0 0 0 0 0  Down, Depressed, Hopeless 0 0 0 0 0  PHQ - 2 Score 0 0 0 0 0  Altered sleeping - - 1 0 1  Tired, decreased energy - - 0 0 1  Change in appetite - -  0 0 0  Feeling bad or failure about yourself  - - 0 0 0  Trouble concentrating - - 0 0 0  Moving slowly or fidgety/restless - - 0 0 0  Suicidal thoughts - - 0 0 0  PHQ-9 Score - -  1 0 2  Difficult doing work/chores - - Not difficult at all - Not difficult at all   Interpretation of Total Score  Total Score Depression Severity:  1-4 = Minimal depression, 5-9 = Mild depression, 10-14 = Moderate depression, 15-19 = Moderately severe depression, 20-27 = Severe depression   Psychosocial Evaluation and Intervention:   Psychosocial Re-Evaluation:  Psychosocial Re-Evaluation     Row Name 07/31/22 1742 08/23/22 0836           Psychosocial Re-Evaluation   Current issues with Current Stress Concerns;History of Depression;Current Anxiety/Panic Current Stress Concerns;History of Depression;Current Anxiety/Panic      Comments Brandon [name] does have health related concerns no other concerns have been voiced during exercise at intensive cardiac rehab Brandon [name] completed his radiation treatments. Brandon [name] continues to have a positive outlook on life      Expected Outcomes Brandon [name] will have decreased anxiety less stress upon completion of intensive cardiac rehab Brandon [name] will have decreased anxiety less stress upon completion of intensive cardiac rehab      Interventions Encouraged to attend Cardiac Rehabilitation for the exercise;Stress management education;Encouraged to attend Pulmonary Rehabilitation for the exercise Encouraged to attend Cardiac Rehabilitation for the exercise;Stress management education;Encouraged to attend Pulmonary Rehabilitation for the exercise      Continue Psychosocial Services  Follow up required by staff Follow up required by staff        Initial Review   Source of Stress Concerns Chronic Illness Chronic Illness      Comments Will continue to monitor and offer support as needed Will continue to monitor and offer support as needed               Psychosocial Discharge (Final Psychosocial Re-Evaluation):  Psychosocial Re-Evaluation - 08/23/22 0836       Psychosocial Re-Evaluation   Current issues with Current Stress Concerns;History of Depression;Current  Anxiety/Panic    Comments Brandon [name] completed his radiation treatments. Brandon [name] continues to have a positive outlook on life    Expected Outcomes Brandon [name] will have decreased anxiety less stress upon completion of intensive cardiac rehab    Interventions Encouraged to attend Cardiac Rehabilitation for the exercise;Stress management education;Encouraged to attend Pulmonary Rehabilitation for the exercise    Continue Psychosocial Services  Follow up required by staff      Initial Review   Source of Stress Concerns Chronic Illness    Comments Will continue to monitor and offer support as needed             Vocational Rehabilitation: Provide vocational rehab assistance to qualifying candidates.   Vocational Rehab Evaluation & Intervention:  Vocational Rehab - 07/04/22 1353       Initial Vocational Rehab Evaluation & Intervention   Assessment shows need for Vocational Rehabilitation No   Brandon [name] is retired and does not need vocational rehab at this time            Education: Education Goals: Education classes will be provided on a weekly basis, covering required topics. Participant will state understanding/return demonstration of topics presented.    Education     Row Name 07/10/22 1600     Education   Cardiac Education Topics Pritikin   Psychologist, sport and exercise  Core Videos   Educator Dietitian   Select Nutrition   Nutrition Facts on Fat   Instruction Review Code 1- Verbalizes Understanding   Class Start Time 1400   Class Stop Time 1450   Class Time Calculation (min) 50 min    Row Name 07/17/22 1500     Education   Cardiac Education Topics Pritikin   Customer service manager   Weekly Topic Adding Flavor - Sodium-Free   Instruction Review Code 1- Verbalizes Understanding   Class Start Time 1400   Class Stop Time 1453   Class Time Calculation (min) 53 min    Row Name 07/19/22 1500     Education   Cardiac Education Topics Pritikin    Hospital doctor Education   General Education Heart Disease Risk Reduction   Instruction Review Code 1- Verbalizes Understanding   Class Start Time 1355   Class Stop Time 1440   Class Time Calculation (min) 45 min    Row Name 07/22/22 1500     Education   Cardiac Education Topics Pritikin   Geographical information systems officer Exercise   Exercise Workshop Location manager and Fall Prevention   Instruction Review Code 1- Verbalizes Understanding   Class Start Time 1350   Class Stop Time 1432   Class Time Calculation (min) 42 min    Row Name 07/24/22 1500     Education   Cardiac Education Topics Pritikin   Customer service manager   Weekly Topic Fast and Healthy Breakfasts   Instruction Review Code 1- Verbalizes Understanding   Class Start Time 1355   Class Stop Time 1450   Class Time Calculation (min) 55 min    Row Name 07/26/22 1600     Education   Cardiac Education Topics Pritikin   Nurse, children's Dietitian   Nutrition Overview of the Pritikin Eating Plan   Instruction Review Code 1- Verbalizes Understanding   Class Start Time 1400   Class Stop Time 1436   Class Time Calculation (min) 36 min    Row Name 07/29/22 1600     Education   Cardiac Education Topics Pritikin   Select Workshops     Workshops   Educator Exercise Physiologist   Select Psychosocial   Psychosocial Workshop Recognizing and Reducing Stress   Instruction Review Code 1- Verbalizes Understanding   Class Start Time 1405   Class Stop Time 1454   Class Time Calculation (min) 49 min    Row Name 07/31/22 1600     Education   Cardiac Education Topics Pritikin   Customer service manager   Weekly Topic Personalizing Your Pritikin Plate   Instruction Review Code 1-  Verbalizes Understanding   Class Start Time 1400   Class Stop Time 1448   Class Time Calculation (min) 48 min    Row Name 08/02/22 1600     Education   Cardiac Education Topics Pritikin   Psychologist, forensic Psychosocial   Psychosocial Healthy Minds, Bodies, Hearts   Instruction Review Code 1- Verbalizes Understanding   Class Start  Time 1405   Class Stop Time 1443   Class Time Calculation (min) 38 min    Row Name 08/05/22 1600     Education   Cardiac Education Topics Pritikin   Glass blower/designer Nutrition   Nutrition Workshop Label Reading   Instruction Review Code 1- Verbalizes Understanding   Class Start Time 1400   Class Stop Time 1445   Class Time Calculation (min) 45 min    Row Name 08/07/22 1600     Education   Cardiac Education Topics Pritikin   Orthoptist   Educator Dietitian   Weekly Topic Delicious Desserts   Instruction Review Code 1- Verbalizes Understanding   Class Start Time 1400   Class Stop Time 1446   Class Time Calculation (min) 46 min    Row Name 08/09/22 0847     Education   Cardiac Education Topics Pritikin   Select Core Videos     Core Videos   Educator Dietitian   Select Nutrition   Nutrition Other   Instruction Review Code 1- Verbalizes Understanding   Class Start Time 1400   Class Stop Time 1446   Class Time Calculation (min) 46 min     Cooking School   Instruction Review Code 1- [employer] Understanding    Row Name 08/12/22 1500     Education   Cardiac Education Topics Pritikin   Select Workshops     Workshops   Educator Exercise Physiologist   Select Exercise   Exercise Workshop Exercise Basics: Building Your Action Plan   Instruction Review Code 1- Verbalizes Understanding   Class Start Time 1405   Class Stop Time 1452   Class Time Calculation (min) 47 min    Row Name 08/14/22  1500     Education   Cardiac Education Topics Pritikin   Customer service manager   Weekly Topic Tasty Appetizers and Snacks   Instruction Review Code 1- Verbalizes Understanding   Class Start Time 1400   Class Stop Time 1443   Class Time Calculation (min) 43 min    Row Name 08/16/22 1500     Education   Cardiac Education Topics Pritikin   Licensed conveyancer Nutrition   Nutrition Calorie Density   Instruction Review Code 1- Verbalizes Understanding   Class Start Time 1400   Class Stop Time 1440   Class Time Calculation (min) 40 min    Row Name 08/19/22 1500     Education   Cardiac Education Topics Pritikin   Psychologist, counselling   Select Nutrition   Nutrition Nutrition Action Plan   Instruction Review Code 1- Verbalizes Understanding   Class Start Time 1405   Class Stop Time 1440   Class Time Calculation (min) 35 min    Row Name 08/21/22 1600     Education   Cardiac Education Topics Pritikin   Customer service manager   Weekly Topic Efficiency Cooking - Meals in a Snap   Instruction Review Code 1- Verbalizes Understanding   Class Start Time 1400   Class Stop Time 1445   Class Time Calculation (min) 45 min  Core Videos: Exercise    Move It!  Clinical staff conducted group or individual video education with verbal and written material and guidebook.  Patient learns the recommended Pritikin exercise program. Exercise with the goal of living a long, healthy life. Some of the health benefits of exercise include controlled diabetes, healthier blood pressure levels, improved cholesterol levels, improved heart and lung capacity, improved sleep, and better body composition. Everyone should speak with their doctor before starting or changing an exercise routine.  Biomechanical  Limitations Clinical staff conducted group or individual video education with verbal and written material and guidebook.  Patient learns how biomechanical limitations can impact exercise and how we can mitigate and possibly overcome limitations to have an impactful and balanced exercise routine.  Body Composition Clinical staff conducted group or individual video education with verbal and written material and guidebook.  Patient learns that body composition (ratio of muscle mass to fat mass) is a key component to assessing overall fitness, rather than body weight alone. Increased fat mass, especially visceral belly fat, can put [place] at increased risk for metabolic syndrome, type 2 diabetes, heart disease, and even death. It is recommended to combine diet and exercise (cardiovascular and resistance training) to improve your body composition. Seek guidance from your physician and exercise physiologist before implementing an exercise routine.  Exercise Action Plan Clinical staff conducted group or individual video education with verbal and written material and guidebook.  Patient learns the recommended strategies to achieve and enjoy long-term exercise adherence, including variety, self-motivation, self-efficacy, and positive decision making. Benefits of exercise include fitness, good health, weight management, more energy, better sleep, less stress, and overall well-being.  Medical   Heart Disease Risk Reduction Clinical staff conducted group or individual video education with verbal and written material and guidebook.  Patient learns our heart is our most vital organ as it circulates oxygen, nutrients, white blood cells, and hormones throughout the entire body, and carries waste away. Data supports a plant-based eating plan like the Pritikin Program for its effectiveness in slowing progression of and reversing heart disease. The video provides a number of recommendations to address heart  disease.   Metabolic Syndrome and Belly Fat  Clinical staff conducted group or individual video education with verbal and written material and guidebook.  Patient learns what metabolic syndrome is, how it leads to heart disease, and how one can reverse it and keep it from coming back. You have metabolic syndrome if you have 3 of the following 5 criteria: abdominal obesity, high blood pressure, high triglycerides, low HDL cholesterol, and high blood sugar.  Hypertension and Heart Disease Clinical staff conducted group or individual video education with verbal and written material and guidebook.  Patient learns that high blood pressure, or hypertension, is very common in the [place]. Hypertension is largely due to excessive salt intake, but other important risk factors include being overweight, physical inactivity, drinking too much alcohol, smoking, and not eating enough potassium from fruits and vegetables. High blood pressure is a leading risk factor for heart attack, stroke, congestive heart failure, dementia, kidney failure, and premature death. Long-term effects of excessive salt intake include stiffening of the arteries and thickening of heart muscle and organ damage. Recommendations include ways to reduce hypertension and the risk of heart disease.  Diseases of Our Time - Focusing on Diabetes Clinical staff conducted group or individual video education with verbal and written material and guidebook.  Patient learns why the best way to stop diseases of our time  is prevention, through food and other lifestyle changes. Medicine (such as prescription pills and surgeries) is often only a Band-Aid on the problem, not a long-term solution. Most common diseases of our time include obesity, type 2 diabetes, hypertension, heart disease, and cancer. The Pritikin Program is recommended and has been proven to help reduce, reverse, and/or prevent the damaging effects of metabolic syndrome.  Nutrition    Overview of the Pritikin Eating Plan  Clinical staff conducted group or individual video education with verbal and written material and guidebook.  Patient learns about the Pritikin Eating Plan for disease risk reduction. The Pritikin Eating Plan emphasizes a wide variety of unrefined, minimally-processed carbohydrates, like fruits, vegetables, whole grains, and legumes. Go, Caution, and Stop food choices are explained. Plant-based and lean animal proteins are emphasized. Rationale provided for low sodium intake for blood pressure control, low added sugars for blood sugar stabilization, and low added fats and oils for coronary artery disease risk reduction and weight management.  Calorie Density  Clinical staff conducted group or individual video education with verbal and written material and guidebook.  Patient learns about calorie density and how it impacts the Pritikin Eating Plan. Knowing the characteristics of the food you choose will help you decide whether those foods will lead to weight gain or weight loss, and whether you want to consume more or less of them. Weight loss is usually a side effect of the Pritikin Eating Plan because of its focus on low calorie-dense foods.  Label Reading  Clinical staff conducted group or individual video education with verbal and written material and guidebook.  Patient learns about the Pritikin recommended label reading guidelines and corresponding recommendations regarding calorie density, added sugars, sodium content, and whole grains.  Dining Out - Part 1  Clinical staff conducted group or individual video education with verbal and written material and guidebook.  Patient learns that restaurant meals can be sabotaging because they can be so high in calories, fat, sodium, and/or sugar. Patient learns recommended strategies on how to positively address this and avoid unhealthy pitfalls.  Facts on Fats  Clinical staff conducted group or individual video  education with verbal and written material and guidebook.  Patient learns that lifestyle modifications can be just as effective, if not more so, as many medications for lowering your risk of heart disease. A Pritikin lifestyle can help to reduce your risk of inflammation and atherosclerosis (cholesterol build-up, or plaque, in the artery walls). Lifestyle interventions such as dietary choices and physical activity address the cause of atherosclerosis. A review of the types of fats and their impact on blood cholesterol levels, along with dietary recommendations to reduce fat intake is also included.  Nutrition Action Plan  Clinical staff conducted group or individual video education with verbal and written material and guidebook.  Patient learns how to incorporate Pritikin recommendations into their lifestyle. Recommendations include planning and keeping personal health goals in mind as an important part of their success.  Healthy Mind-Set    Healthy Minds, Bodies, Hearts  Clinical staff conducted group or individual video education with verbal and written material and guidebook.  Patient learns how to identify when they are stressed. Video will discuss the impact of that stress, as well as the many benefits of stress management. Patient will also be introduced to stress management techniques. The way we think, act, and feel has an impact on our hearts.  How Our Thoughts Can Heal Our Hearts  Clinical staff conducted group or individual video  education with verbal and written material and guidebook.  Patient learns that negative thoughts can cause depression and anxiety. This can result in negative lifestyle behavior and serious health problems. Cognitive behavioral therapy is an effective method to help control our thoughts in order to change and improve our emotional outlook.  Additional Videos:  Exercise    Improving Performance  Clinical staff conducted group or individual video education with  verbal and written material and guidebook.  Patient learns to use a non-linear approach by alternating intensity levels and lengths of time spent exercising to help burn more calories and lose more body fat. Cardiovascular exercise helps improve heart health, metabolism, hormonal balance, blood sugar control, and recovery from fatigue. Resistance training improves strength, endurance, balance, coordination, reaction time, metabolism, and muscle mass. Flexibility exercise improves circulation, posture, and balance. Seek guidance from your physician and exercise physiologist before implementing an exercise routine and learn your capabilities and proper form for all exercise.  Introduction to Yoga  Clinical staff conducted group or individual video education with verbal and written material and guidebook.  Patient learns about yoga, a discipline of the coming together of mind, breath, and body. The benefits of yoga include improved flexibility, improved range of motion, better posture and core strength, increased lung function, weight loss, and positive self-image. Yoga's heart health benefits include lowered blood pressure, healthier heart rate, decreased cholesterol and triglyceride levels, improved immune function, and reduced stress. Seek guidance from your physician and exercise physiologist before implementing an exercise routine and learn your capabilities and proper form for all exercise.  Medical   Aging: Enhancing Your Quality of Life  Clinical staff conducted group or individual video education with verbal and written material and guidebook.  Patient learns key strategies and recommendations to stay in good physical health and enhance quality of life, such as prevention strategies, having an advocate, securing a Health Care Proxy and Power of Attorney, and keeping a list of medications and system for tracking them. It also discusses how to avoid risk for bone loss.  Biology of Weight Control   Clinical staff conducted group or individual video education with verbal and written material and guidebook.  Patient learns that weight gain occurs because we consume more calories than we burn (eating more, moving less). Even if your body weight is normal, you may have higher ratios of fat compared to muscle mass. Too much body fat puts you at increased risk for cardiovascular disease, heart attack, stroke, type 2 diabetes, and obesity-related cancers. In addition to exercise, following the Pritikin Eating Plan can help reduce your risk.  Decoding Lab Results  Clinical staff conducted group or individual video education with verbal and written material and guidebook.  Patient learns that lab test reflects one measurement whose values change over time and are influenced by many factors, including medication, stress, sleep, exercise, food, hydration, pre-existing medical conditions, and more. It is recommended to use the knowledge from this video to become more involved with your lab results and evaluate your numbers to speak with your doctor.   Diseases of Our Time - Overview  Clinical staff conducted group or individual video education with verbal and written material and guidebook.  Patient learns that according to the CDC, 50% to 70% of chronic diseases (such as obesity, type 2 diabetes, elevated lipids, hypertension, and heart disease) are avoidable through lifestyle improvements including healthier food choices, listening to satiety cues, and increased physical activity.  Sleep Disorders Clinical staff conducted group or individual  video education with verbal and written material and guidebook.  Patient learns how good quality and duration of sleep are important to overall health and well-being. Patient also learns about sleep disorders and how they impact health along with recommendations to address them, including discussing with a physician.  Nutrition  Dining Out - Part 2 Clinical staff  conducted group or individual video education with verbal and written material and guidebook.  Patient learns how to plan ahead and communicate in order to maximize their dining experience in a healthy and nutritious manner. Included are recommended food choices based on the type of restaurant the patient is visiting.   Fueling a Banker conducted group or individual video education with verbal and written material and guidebook.  There is a strong connection between our food choices and our health. Diseases like obesity and type 2 diabetes are very prevalent and are in large-part due to lifestyle choices. The Pritikin Eating Plan provides plenty of food and hunger-curbing satisfaction. It is easy to follow, affordable, and helps reduce health risks.  Menu Workshop  Clinical staff conducted group or individual video education with verbal and written material and guidebook.  Patient learns that restaurant meals can sabotage health goals because they are often packed with calories, fat, sodium, and sugar. Recommendations include strategies to plan ahead and to communicate with the manager, chef, or server to help order a healthier meal.  Planning Your Eating Strategy  Clinical staff conducted group or individual video education with verbal and written material and guidebook.  Patient learns about the Pritikin Eating Plan and its benefit of reducing the risk of disease. The Pritikin Eating Plan does not focus on calories. Instead, it emphasizes high-quality, nutrient-rich foods. By knowing the characteristics of the foods, we choose, we can determine their calorie density and make informed decisions.  Targeting Your Nutrition Priorities  Clinical staff conducted group or individual video education with verbal and written material and guidebook.  Patient learns that lifestyle habits have a tremendous impact on disease risk and progression. This video provides eating and physical  activity recommendations based on your personal health goals, such as reducing LDL cholesterol, losing weight, preventing or controlling type 2 diabetes, and reducing high blood pressure.  Vitamins and Minerals  Clinical staff conducted group or individual video education with verbal and written material and guidebook.  Patient learns different ways to obtain key vitamins and minerals, including through a recommended healthy diet. It is important to discuss all supplements you take with your doctor.   Healthy Mind-Set    Smoking Cessation  Clinical staff conducted group or individual video education with verbal and written material and guidebook.  Patient learns that cigarette smoking and tobacco addiction pose a serious health risk which affects millions of people. Stopping smoking will significantly reduce the risk of heart disease, lung disease, and many forms of cancer. Recommended strategies for quitting are covered, including working with your doctor to develop a successful plan.  Culinary   Becoming a Set designer conducted group or individual video education with verbal and written material and guidebook.  Patient learns that cooking at home can be healthy, cost-effective, quick, and puts them in control. Keys to cooking healthy recipes will include looking at your recipe, assessing your equipment needs, planning ahead, making it simple, choosing cost-effective seasonal ingredients, and limiting the use of added fats, salts, and sugars.  Cooking - Breakfast and Snacks  Clinical staff conducted group or individual  video education with verbal and written material and guidebook.  Patient learns how important breakfast is to satiety and nutrition through the entire day. Recommendations include key foods to eat during breakfast to help stabilize blood sugar levels and to prevent overeating at meals later in the day. Planning ahead is also a key component.  Cooking - Psychologist, educational conducted group or individual video education with verbal and written material and guidebook.  Patient learns eating strategies to improve overall health, including an approach to cook more at home. Recommendations include thinking of animal protein as a side on your plate rather than center stage and focusing instead on lower calorie dense options like vegetables, fruits, whole grains, and plant-based proteins, such as beans. Making sauces in large quantities to freeze for later and leaving the skin on your vegetables are also recommended to maximize your experience.  Cooking - Healthy Salads and Dressing Clinical staff conducted group or individual video education with verbal and written material and guidebook.  Patient learns that vegetables, fruits, whole grains, and legumes are the foundations of the Pritikin Eating Plan. Recommendations include how to incorporate each of these in flavorful and healthy salads, and how to create homemade salad dressings. Proper handling of ingredients is also covered. Cooking - Soups and [employer] - Soups and Desserts Clinical staff conducted group or individual video education with verbal and written material and guidebook.  Patient learns that Pritikin soups and desserts make for easy, nutritious, and delicious snacks and meal components that are low in sodium, fat, sugar, and calorie density, while high in vitamins, minerals, and filling fiber. Recommendations include simple and healthy ideas for soups and desserts.   Overview     The Pritikin Solution Program Overview Clinical staff conducted group or individual video education with verbal and written material and guidebook.  Patient learns that the results of the Pritikin Program have been documented in more than 100 articles published in peer-reviewed journals, and the benefits include reducing risk factors for (and, in some cases, even reversing) high cholesterol, high  blood pressure, type 2 diabetes, obesity, and more! An overview of the three key pillars of the Pritikin Program will be covered: eating well, doing regular exercise, and having a healthy mind-set.  WORKSHOPS  Exercise: Exercise Basics: Building Your Action Plan Clinical staff led group instruction and group discussion with PowerPoint presentation and patient guidebook. To enhance the learning environment the use of posters, models and videos may be added. At the conclusion of this workshop, patients will comprehend the difference between physical activity and exercise, as well as the benefits of incorporating both, into their routine. Patients will understand the FITT (Frequency, Intensity, Time, and Type) principle and how to use it to build an exercise action plan. In addition, safety concerns and other considerations for exercise and cardiac rehab will be addressed by the presenter. The purpose of this lesson is to promote a comprehensive and effective weekly exercise routine in order to improve patients' overall level of fitness.   Managing Heart Disease: Your Path to a Healthier Heart Clinical staff led group instruction and group discussion with PowerPoint presentation and patient guidebook. To enhance the learning environment the use of posters, models and videos may be added.At the conclusion of this workshop, patients will understand the anatomy and physiology of the heart. Additionally, they will understand how Pritikin's three pillars impact the risk factors, the progression, and the management of heart disease.  The purpose of  this lesson is to provide a high-level overview of the heart, heart disease, and how the Pritikin lifestyle positively impacts risk factors.  Exercise Biomechanics Clinical staff led group instruction and group discussion with PowerPoint presentation and patient guidebook. To enhance the learning environment the use of posters, models and videos may be added.  Patients will learn how the structural parts of their bodies function and how these functions impact their daily activities, movement, and exercise. Patients will learn how to promote a neutral spine, learn how to manage pain, and identify ways to improve their physical movement in order to promote healthy living. The purpose of this lesson is to expose patients to common physical limitations that impact physical activity. Participants will learn practical ways to adapt and manage aches and pains, and to minimize their effect on regular exercise. Patients will learn how to maintain good posture while sitting, walking, and lifting.  Balance Training and Fall Prevention  Clinical staff led group instruction and group discussion with PowerPoint presentation and patient guidebook. To enhance the learning environment the use of posters, models and videos may be added. At the conclusion of this workshop, patients will understand the importance of their sensorimotor skills (vision, proprioception, and the vestibular system) in maintaining their ability to balance as they age. Patients will apply a variety of balancing exercises that are appropriate for their current level of function. Patients will understand the common causes for poor balance, possible solutions to these problems, and ways to modify their physical environment in order to minimize their fall risk. The purpose of this lesson is to teach patients about the importance of maintaining balance as they age and ways to minimize their risk of falling.  WORKSHOPS   Nutrition:  Fueling a Ship broker led group instruction and group discussion with PowerPoint presentation and patient guidebook. To enhance the learning environment the use of posters, models and videos may be added. Patients will review the foundational principles of the Pritikin Eating Plan and understand what constitutes a serving size in each of the food groups.  Patients will also learn Pritikin-friendly foods that are better choices when away from home and review make-ahead meal and snack options. Calorie density will be reviewed and applied to three nutrition priorities: weight maintenance, weight loss, and weight gain. The purpose of this lesson is to reinforce (in a group setting) the key concepts around what patients are recommended to eat and how to apply these guidelines when away from home by planning and selecting Pritikin-friendly options. Patients will understand how calorie density may be adjusted for different weight management goals.  Mindful Eating  Clinical staff led group instruction and group discussion with PowerPoint presentation and patient guidebook. To enhance the learning environment the use of posters, models and videos may be added. Patients will briefly review the concepts of the Pritikin Eating Plan and the importance of low-calorie dense foods. The concept of mindful eating will be introduced as well as the importance of paying attention to internal hunger signals. Triggers for non-hunger eating and techniques for dealing with triggers will be explored. The purpose of this lesson is to provide patients with the opportunity to review the basic principles of the Pritikin Eating Plan, discuss the value of eating mindfully and how to measure internal cues of hunger and fullness using the Hunger Scale. Patients will also discuss reasons for non-hunger eating and learn strategies to use for controlling emotional eating.  Targeting Your Nutrition Priorities Clinical staff led group instruction  and group discussion with PowerPoint presentation and patient guidebook. To enhance the learning environment the use of posters, models and videos may be added. Patients will learn how to determine their genetic susceptibility to disease by reviewing their family history. Patients will gain insight into the importance of diet as part of an overall healthy  lifestyle in mitigating the impact of genetics and other environmental insults. The purpose of this lesson is to provide patients with the opportunity to assess their personal nutrition priorities by looking at their family history, their own health history and current risk factors. Patients will also be able to discuss ways of prioritizing and modifying the Pritikin Eating Plan for their highest risk areas  Menu  Clinical staff led group instruction and group discussion with PowerPoint presentation and patient guidebook. To enhance the learning environment the use of posters, models and videos may be added. Using menus brought in from [employer], or printed from [employer], patients will apply the Pritikin dining out guidelines that were presented in the [employer] video. Patients will also be able to practice these guidelines in a variety of provided scenarios. The purpose of this lesson is to provide patients with the opportunity to practice hands-on learning of the Pritikin Dining Out guidelines with actual menus and practice scenarios.  Label Reading Clinical staff led group instruction and group discussion with PowerPoint presentation and patient guidebook. To enhance the learning environment the use of posters, models and videos may be added. Patients will review and discuss the Pritikin label reading guidelines presented in Pritikin's Label Reading Educational series video. Using fool labels brought in from local grocery stores and markets, patients will apply the label reading guidelines and determine if the packaged food meet the Pritikin guidelines. The purpose of this lesson is to provide patients with the opportunity to review, discuss, and practice hands-on learning of the Pritikin Label Reading guidelines with actual packaged food labels. Cooking School  Pritikin's [employer] are designed to teach patients ways to prepare quick, simple, and  affordable recipes at home. The importance of nutrition's role in chronic disease risk reduction is reflected in its emphasis in the overall Pritikin program. By learning how to prepare essential core Pritikin Eating Plan recipes, patients will increase control over what they eat; be able to customize the flavor of foods without the use of added salt, sugar, or fat; and improve the quality of the food they consume. By learning a set of core recipes which are easily assembled, quickly prepared, and affordable, patients are more likely to prepare more healthy foods at home. These workshops focus on convenient breakfasts, simple entres, side dishes, and desserts which can be prepared with minimal effort and are consistent with nutrition recommendations for cardiovascular risk reduction. Cooking [employer] are taught by a Armed forces logistics/support/administrative officer (RD) who has been trained by the [employer]. The chef or RD has a clear understanding of the importance of minimizing - if not completely eliminating - added fat, sugar, and sodium in recipes. Throughout the series of Cooking School Workshop sessions, patients will learn about healthy ingredients and efficient methods of cooking to build confidence in their capability to prepare    Cooking School weekly topics:  Adding Flavor- Sodium-Free  Fast and Healthy Breakfasts  Powerhouse Plant-Based Proteins  Satisfying Salads and Dressings  Simple Sides and Sauces  International Cuisine-Spotlight on the [employer] Zones  Delicious Desserts  Savory Soups  [employer] - Meals in  a Snap  Tasty Appetizers and Snacks  Comforting Weekend Breakfasts  One-Pot Wonders   Fast Evening Meals  Landscape architect Your Pritikin Plate  WORKSHOPS   Healthy Mindset (Psychosocial):  Focused Goals, Sustainable Changes Clinical staff led group instruction and group discussion with PowerPoint presentation and patient guidebook. To enhance  the learning environment the use of posters, models and videos may be added. Patients will be able to apply effective goal setting strategies to establish at least one personal goal, and then take consistent, meaningful action toward that goal. They will learn to identify common barriers to achieving personal goals and develop strategies to overcome them. Patients will also gain an understanding of how our mind-set can impact our ability to achieve goals and the importance of cultivating a positive and growth-oriented mind-set. The purpose of this lesson is to provide patients with a deeper understanding of how to set and achieve personal goals, as well as the tools and strategies needed to overcome common obstacles which may arise along the way.  From Head to Heart: The Power of a Healthy Outlook  Clinical staff led group instruction and group discussion with PowerPoint presentation and patient guidebook. To enhance the learning environment the use of posters, models and videos may be added. Patients will be able to recognize and describe the impact of emotions and mood on physical health. They will discover the importance of self-care and explore self-care practices which may work for them. Patients will also learn how to utilize the 4 C's to cultivate a healthier outlook and better manage stress and challenges. The purpose of this lesson is to demonstrate to patients how a healthy outlook is an essential part of maintaining good health, especially as they continue their cardiac rehab journey.  Healthy Sleep for a Healthy Heart Clinical staff led group instruction and group discussion with PowerPoint presentation and patient guidebook. To enhance the learning environment the use of posters, models and videos may be added. At the conclusion of this workshop, patients will be able to demonstrate knowledge of the importance of sleep to overall health, well-being, and quality of life. They will understand the  symptoms of, and treatments for, common sleep disorders. Patients will also be able to identify daytime and nighttime behaviors which impact sleep, and they will be able to apply these tools to help manage sleep-related challenges. The purpose of this lesson is to provide patients with a general overview of sleep and outline the importance of quality sleep. Patients will learn about a few of the most common sleep disorders. Patients will also be introduced to the concept of "sleep hygiene," and discover ways to self-manage certain sleeping problems through simple daily behavior changes. Finally, the workshop will motivate patients by clarifying the links between quality sleep and their goals of heart-healthy living.   Recognizing and Reducing Stress Clinical staff led group instruction and group discussion with PowerPoint presentation and patient guidebook. To enhance the learning environment the use of posters, models and videos may be added. At the conclusion of this workshop, patients will be able to understand the types of stress reactions, differentiate between acute and chronic stress, and recognize the impact that chronic stress has on their health. They will also be able to apply different coping mechanisms, such as reframing negative self-talk. Patients will have the opportunity to practice a variety of stress management techniques, such as deep abdominal breathing, progressive muscle relaxation, and/or guided imagery.  The purpose of this lesson is to educate  patients on the role of stress in their lives and to provide healthy techniques for coping with it.  Learning Barriers/Preferences:  Learning Barriers/Preferences - 07/04/22 1418       Learning Barriers/Preferences   Learning Barriers Sight   wears reading glasses   Learning Preferences Audio;Group Instruction;Individual Instruction;Pictoral;Skilled Demonstration;Verbal Instruction;Video;Written Material             Education  Topics:  Knowledge Questionnaire Score:  Knowledge Questionnaire Score - 07/04/22 1355       Knowledge Questionnaire Score   Pre Score 25/28             Core Components/Risk Factors/Patient Goals at Admission:  Personal Goals and Risk Factors at Admission - 07/04/22 1419       Core Components/Risk Factors/Patient Goals on Admission    Weight Management Yes    Intervention Weight Management: Develop a combined nutrition and exercise program designed to reach desired caloric intake, while maintaining appropriate intake of nutrient and fiber, sodium and fats, and appropriate energy expenditure required for the weight goal.    Expected Outcomes Short Term: Continue to assess and modify interventions until short term weight is achieved;Long Term: Adherence to nutrition and physical activity/exercise program aimed toward attainment of established weight goal    Heart Failure --    Hypertension Yes    Lipids Yes    Stress Yes             Core Components/Risk Factors/Patient Goals Review:   Goals and Risk Factor Review     Row Name 07/31/22 1745 08/23/22 9604           Core Components/Risk Factors/Patient Goals Review   Personal Goals Review Weight Management/Obesity;Hypertension;Lipids;Stress;Heart Failure Weight Management/Obesity;Hypertension;Lipids;Stress;Heart Failure      Review Johnathyn is doing well with exercise at intensive cardiac rehab. Map's have been stable Isiaah continues to do  well with exercise at intensive cardiac rehab. Map's and weight  have been stable. Lemont continues to enjoy participating in the program      Expected Outcomes Clevon will continue to participate in intensive cardiac rehab for exercise, nutrition and lifestyle modifications Lamaj will continue to participate in intensive cardiac rehab for exercise, nutrition and lifestyle modifications               Core Components/Risk Factors/Patient Goals at Discharge (Final Review):   Goals and  Risk Factor Review - 08/23/22 0838       Core Components/Risk Factors/Patient Goals Review   Personal Goals Review Weight Management/Obesity;Hypertension;Lipids;Stress;Heart Failure    Review Itzel continues to do  well with exercise at intensive cardiac rehab. Map's and weight  have been stable. Osmin continues to enjoy participating in the program    Expected Outcomes Adryen will continue to participate in intensive cardiac rehab for exercise, nutrition and lifestyle modifications             ITP Comments:  ITP Comments     Row Name 07/04/22 1021 07/09/22 1154 07/31/22 1740 08/23/22 0836     ITP Comments Dr. Armanda Magic medical director. Introduction to pritikin education/ intensive cardiac rehab. Initial orientation packet reviewed with patient. 30 day ITP Review.  Mckale is scheduled to begin intensive cardiac rehab on 07/10/22 30 day ITP Review.  Aadyn has good attendance and participation in intensive cardiac rehab. Nishant is doing well with exercise 30 day ITP Review.  Elford has good attendance and participation in intensive cardiac rehab.  Comments: See ITP comments.Harrell Gave RN BSN

## 2022-08-26 ENCOUNTER — Ambulatory Visit (HOSPITAL_COMMUNITY): Payer: Medicare Other

## 2022-08-26 ENCOUNTER — Encounter (HOSPITAL_COMMUNITY)
Admission: RE | Admit: 2022-08-26 | Discharge: 2022-08-26 | Disposition: A | Discharge status: Home / Self Care | Payer: Medicare Other | Source: Ambulatory Visit | Attending: Internal Medicine | Admitting: Internal Medicine

## 2022-08-26 DIAGNOSIS — I2102 ST elevation (STEMI) myocardial infarction involving left anterior descending coronary artery: Secondary | ICD-10-CM

## 2022-08-28 ENCOUNTER — Encounter (INDEPENDENT_AMBULATORY_CARE_PROVIDER_SITE_OTHER): Payer: Medicare Other | Admitting: Ophthalmology

## 2022-08-28 ENCOUNTER — Encounter (HOSPITAL_COMMUNITY)
Admission: RE | Admit: 2022-08-28 | Discharge: 2022-08-28 | Disposition: A | Discharge status: Home / Self Care | Payer: Medicare Other | Source: Ambulatory Visit | Attending: Internal Medicine | Admitting: Internal Medicine

## 2022-08-28 ENCOUNTER — Ambulatory Visit (HOSPITAL_COMMUNITY): Payer: Medicare Other

## 2022-08-28 DIAGNOSIS — H353132 Nonexudative age-related macular degeneration, bilateral, intermediate dry stage: Secondary | ICD-10-CM | POA: Diagnosis not present

## 2022-08-28 DIAGNOSIS — I10 Essential (primary) hypertension: Secondary | ICD-10-CM

## 2022-08-28 DIAGNOSIS — H35033 Hypertensive retinopathy, bilateral: Secondary | ICD-10-CM | POA: Diagnosis not present

## 2022-08-28 DIAGNOSIS — I2102 ST elevation (STEMI) myocardial infarction involving left anterior descending coronary artery: Secondary | ICD-10-CM | POA: Diagnosis not present

## 2022-08-28 DIAGNOSIS — H35712 Central serous chorioretinopathy, left eye: Secondary | ICD-10-CM

## 2022-08-28 DIAGNOSIS — H43813 Vitreous degeneration, bilateral: Secondary | ICD-10-CM

## 2022-08-29 ENCOUNTER — Ambulatory Visit (HOSPITAL_COMMUNITY): Payer: Self-pay

## 2022-08-29 LAB — POCT INR: INR: 2.5 (ref 2.0–3.0)

## 2022-08-30 ENCOUNTER — Encounter (HOSPITAL_COMMUNITY)
Admission: RE | Admit: 2022-08-30 | Discharge: 2022-08-30 | Disposition: A | Discharge status: Home / Self Care | Payer: Medicare Other | Source: Ambulatory Visit | Attending: Internal Medicine | Admitting: Internal Medicine

## 2022-08-30 ENCOUNTER — Ambulatory Visit (HOSPITAL_COMMUNITY): Payer: Medicare Other

## 2022-08-30 DIAGNOSIS — I2102 ST elevation (STEMI) myocardial infarction involving left anterior descending coronary artery: Secondary | ICD-10-CM

## 2022-09-02 ENCOUNTER — Encounter (HOSPITAL_COMMUNITY)
Admission: RE | Admit: 2022-09-02 | Discharge: 2022-09-02 | Disposition: A | Discharge status: Home / Self Care | Payer: Medicare Other | Source: Ambulatory Visit | Attending: Internal Medicine | Admitting: Internal Medicine

## 2022-09-02 ENCOUNTER — Ambulatory Visit (HOSPITAL_COMMUNITY): Payer: Medicare Other

## 2022-09-02 DIAGNOSIS — I2102 ST elevation (STEMI) myocardial infarction involving left anterior descending coronary artery: Secondary | ICD-10-CM | POA: Diagnosis not present

## 2022-09-03 ENCOUNTER — Other Ambulatory Visit (HOSPITAL_COMMUNITY): Payer: Self-pay | Admitting: Internal Medicine

## 2022-09-03 ENCOUNTER — Other Ambulatory Visit: Payer: Self-pay

## 2022-09-03 MED ORDER — GABAPENTIN 300 MG PO CAPS
300.0000 mg | ORAL_CAPSULE | Freq: Three times a day (TID) | ORAL | 3 refills | Status: DC
  Filled 2022-09-03: qty 90, 30d supply, fill #0
  Filled 2022-10-05: qty 90, 30d supply, fill #1
  Filled 2022-11-02: qty 90, 30d supply, fill #2
  Filled 2022-12-02: qty 90, 30d supply, fill #3

## 2022-09-04 ENCOUNTER — Encounter (HOSPITAL_COMMUNITY)
Admission: RE | Admit: 2022-09-04 | Discharge: 2022-09-04 | Disposition: A | Discharge status: Home / Self Care | Payer: Medicare Other | Source: Ambulatory Visit | Attending: Internal Medicine | Admitting: Internal Medicine

## 2022-09-04 ENCOUNTER — Ambulatory Visit (HOSPITAL_COMMUNITY): Payer: Medicare Other

## 2022-09-04 DIAGNOSIS — I2102 ST elevation (STEMI) myocardial infarction involving left anterior descending coronary artery: Secondary | ICD-10-CM

## 2022-09-05 ENCOUNTER — Ambulatory Visit (HOSPITAL_COMMUNITY): Payer: Self-pay | Admitting: Pharmacist

## 2022-09-05 LAB — POCT INR: INR: 2.6 (ref 2.0–3.0)

## 2022-09-06 ENCOUNTER — Ambulatory Visit (HOSPITAL_COMMUNITY): Payer: Medicare Other

## 2022-09-06 ENCOUNTER — Encounter (HOSPITAL_COMMUNITY)
Admission: RE | Admit: 2022-09-06 | Discharge: 2022-09-06 | Disposition: A | Discharge status: Home / Self Care | Payer: Medicare Other | Source: Ambulatory Visit | Attending: Internal Medicine | Admitting: Internal Medicine

## 2022-09-06 DIAGNOSIS — I2102 ST elevation (STEMI) myocardial infarction involving left anterior descending coronary artery: Secondary | ICD-10-CM | POA: Diagnosis not present

## 2022-09-07 ENCOUNTER — Other Ambulatory Visit (HOSPITAL_COMMUNITY): Payer: Self-pay | Admitting: Cardiology

## 2022-09-09 ENCOUNTER — Ambulatory Visit (HOSPITAL_COMMUNITY): Payer: Medicare Other

## 2022-09-09 ENCOUNTER — Encounter (HOSPITAL_COMMUNITY)
Admission: RE | Admit: 2022-09-09 | Discharge: 2022-09-09 | Disposition: A | Discharge status: Home / Self Care | Payer: Medicare Other | Source: Ambulatory Visit | Attending: Internal Medicine | Admitting: Internal Medicine

## 2022-09-09 VITALS — Ht 72.0 in | Wt 212.7 lb

## 2022-09-09 DIAGNOSIS — I2102 ST elevation (STEMI) myocardial infarction involving left anterior descending coronary artery: Secondary | ICD-10-CM | POA: Diagnosis not present

## 2022-09-10 ENCOUNTER — Ambulatory Visit: Payer: Medicare Other | Admitting: Student

## 2022-09-11 ENCOUNTER — Encounter (HOSPITAL_COMMUNITY)
Admission: RE | Admit: 2022-09-11 | Discharge: 2022-09-11 | Disposition: A | Discharge status: Home / Self Care | Payer: Medicare Other | Source: Ambulatory Visit | Attending: Internal Medicine | Admitting: Internal Medicine

## 2022-09-11 ENCOUNTER — Ambulatory Visit (HOSPITAL_COMMUNITY): Payer: Medicare Other

## 2022-09-11 DIAGNOSIS — Z955 Presence of coronary angioplasty implant and graft: Secondary | ICD-10-CM | POA: Diagnosis present

## 2022-09-11 DIAGNOSIS — I5042 Chronic combined systolic (congestive) and diastolic (congestive) heart failure: Secondary | ICD-10-CM | POA: Insufficient documentation

## 2022-09-11 DIAGNOSIS — I2102 ST elevation (STEMI) myocardial infarction involving left anterior descending coronary artery: Secondary | ICD-10-CM | POA: Diagnosis present

## 2022-09-11 DIAGNOSIS — Z952 Presence of prosthetic heart valve: Secondary | ICD-10-CM | POA: Insufficient documentation

## 2022-09-11 DIAGNOSIS — Z95811 Presence of heart assist device: Secondary | ICD-10-CM | POA: Diagnosis present

## 2022-09-12 ENCOUNTER — Ambulatory Visit (HOSPITAL_COMMUNITY): Payer: Self-pay | Admitting: Pharmacist

## 2022-09-12 ENCOUNTER — Encounter: Payer: Self-pay | Admitting: Radiation Oncology

## 2022-09-12 LAB — POCT INR: INR: 2.7 (ref 2.0–3.0)

## 2022-09-13 ENCOUNTER — Encounter (HOSPITAL_COMMUNITY)
Admission: RE | Admit: 2022-09-13 | Discharge: 2022-09-13 | Disposition: A | Discharge status: Home / Self Care | Payer: Medicare Other | Source: Ambulatory Visit | Attending: Internal Medicine | Admitting: Internal Medicine

## 2022-09-13 ENCOUNTER — Ambulatory Visit (HOSPITAL_COMMUNITY): Payer: Medicare Other

## 2022-09-13 DIAGNOSIS — I2102 ST elevation (STEMI) myocardial infarction involving left anterior descending coronary artery: Secondary | ICD-10-CM

## 2022-09-13 DIAGNOSIS — Z955 Presence of coronary angioplasty implant and graft: Secondary | ICD-10-CM

## 2022-09-13 DIAGNOSIS — Z952 Presence of prosthetic heart valve: Secondary | ICD-10-CM

## 2022-09-13 DIAGNOSIS — I5042 Chronic combined systolic (congestive) and diastolic (congestive) heart failure: Secondary | ICD-10-CM

## 2022-09-13 DIAGNOSIS — Z95811 Presence of heart assist device: Secondary | ICD-10-CM

## 2022-09-13 NOTE — Progress Notes (Signed)
  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 A diagnosis of Lung nodule was also pertinent to this visit.   PET avid left upper lobe pulmonary nodule      Indication for treatment:  Curative        Radiation treatment dates: from 08/06/22 through 08/16/22   Site/dose: Left lung - treated in total with 50 Gy delivered in 5 Fx at 10 Gy/Fx  Beams/energy: 6X-FFF  Narrative: The patient tolerated radiation treatment relatively well. During his final weekly treatment check on 08/14/22, the patient reported shortness of breath and feeling dizzy when going from a lying to sitting position (found to be hypotensive while sitting). I recommended that he discuss this issue with his cardiologist. He denied any other symptoms or concerns.   Plan: The patient has completed radiation treatment. The patient will return to radiation oncology clinic for routine followup in one month. I advised them to call or return sooner if they have any questions or concerns related to their recovery or treatment.  -----------------------------------  Billie Lade, PhD, MD  This document serves as a record of services personally performed by Antony Blackbird, MD. It was created on his behalf by Neena Rhymes, a trained medical scribe. The creation of this record is based on the scribe's personal observations and the provider's statements to them. This document has been checked and approved by the attending provider.

## 2022-09-15 ENCOUNTER — Other Ambulatory Visit (HOSPITAL_COMMUNITY): Payer: Self-pay | Admitting: Cardiology

## 2022-09-15 ENCOUNTER — Other Ambulatory Visit: Payer: Self-pay | Admitting: Family Medicine

## 2022-09-15 DIAGNOSIS — E876 Hypokalemia: Secondary | ICD-10-CM

## 2022-09-15 DIAGNOSIS — I5023 Acute on chronic systolic (congestive) heart failure: Secondary | ICD-10-CM

## 2022-09-15 DIAGNOSIS — N400 Enlarged prostate without lower urinary tract symptoms: Secondary | ICD-10-CM

## 2022-09-15 NOTE — Progress Notes (Signed)
Radiation Oncology         [phone] ________________________________  Name: Brandon [name] MRN: [id number]  Date: 09/16/2022  DOB: [birth year]  Follow-Up Visit Note  CC: Deeann Saint, MD  Charlett Lango C, *  No diagnosis found.  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 A diagnosis of Lung nodule was also pertinent to this visit.   PET avid left upper lobe pulmonary nodule      Interval Since Last Radiation: 1 month and 1 day  Indication for treatment:  Curative       Radiation treatment dates: from 08/06/22 through 08/16/22  Site/dose: Left lung - treated in total with 50 Gy delivered in 5 Fx at 10 Gy/Fx Beams/energy: 6X-FFF  Narrative:  The patient returns today for routine follow-up. The patient tolerated radiation treatment relatively well. During his final weekly treatment check on 08/14/22, the patient reported shortness of breath and feeling dizzy when going from a lying to sitting position (found to be hypotensive while sitting). I recommended that he discuss this issue with his cardiologist. He denied any other symptoms or concerns.    While undergoing radiation therapy and since completing radiation therapy, the patient has continued to participate in intensive cardiac rehab.   No other significant interval history since the patient completed radiation therapy.   ***                            Allergies:  has No Known Allergies.  Meds: Current Outpatient Medications  Medication Sig Dispense Refill   acetaminophen (TYLENOL) 500 MG tablet Take 500-1,000 mg by mouth every 6 (six) hours as needed (for pain).     aspirin EC 81 MG tablet Take 1 tablet (81 mg total) by mouth daily. Swallow whole. 90 tablet 1   atorvastatin (LIPITOR) 80 MG tablet PATIENT MUST ATTEND ANNUAL APPOINTMENT FOR FUTURE REFILLS TAKE 1 TABLET BY MOUTH ONCE DAILY AT 6PM. 30 tablet 0   Carboxymethylcellulose Sodium (ARTIFICIAL  TEARS OP) Place 1 drop into both eyes daily as needed (for dryness or irritation).     clindamycin (CLEOCIN) 300 MG capsule Take 1 capsule (300 mg total) by mouth 3 (three) times daily. (Patient not taking: Reported on 08/14/2022) 30 capsule 0   eplerenone (INSPRA) 50 MG tablet Take 1 tablet (50 mg total) by mouth in the morning. 30 tablet 6   ezetimibe (ZETIA) 10 MG tablet TAKE ONE TABLET BY MOUTH DAILY 90 tablet 3   gabapentin (NEURONTIN) 300 MG capsule Take 1 capsule (300 mg total) by mouth 3 (three) times daily. 90 capsule 3   losartan (COZAAR) 25 MG tablet Take 0.5 tablets (12.5 mg total) by mouth daily. 30 tablet 5   melatonin 5 MG TABS Take 1 tablet (5 mg total) by mouth at bedtime. (Patient not taking: Reported on 07/15/2022) 30 tablet 6   metoprolol succinate (TOPROL-XL) 25 MG 24 hr tablet Take 0.5 tablets (12.5 mg total) by mouth daily. 45 tablet 1   ondansetron (ZOFRAN) 4 MG tablet Take 1 tablet (4 mg total) by mouth every 8 (eight) hours as needed for nausea or vomiting. (Patient not taking: Reported on 07/15/2022) 20 tablet 3   pantoprazole (PROTONIX) 40 MG tablet Take 1 tablet (40 mg total) by mouth 2 (two) times daily. 60 tablet 6   potassium chloride SA (KLOR-CON M) 20 MEQ tablet Take 1 tablet (20 mEq total) by  mouth daily. 90 tablet 3   sertraline (ZOLOFT) 50 MG tablet TAKE 1/2 TAB BY MOUTH DAILY FOR 1 WEEK, THEN INCREASE TO 1 TABLET DAILY. 90 tablet 3   tamsulosin (FLOMAX) 0.4 MG CAPS capsule Take 1 capsule (0.4 mg total) by mouth daily. 90 capsule 3   torsemide (DEMADEX) 20 MG tablet Take 1 tablet (20 mg total) by mouth as needed. 30 tablet 5   traMADol (ULTRAM) 50 MG tablet TAKE 1-2 TABLETS BY MOUTH TWICE A DAY FOR 30 DAYS 120 tablet 1   warfarin (COUMADIN) 2.5 MG tablet Take 7.5 mg (3 tablets) by mouth daily or as instructed by [facility] clinic. (Patient taking differently: Take 2.5 mg by mouth daily at 4 PM. Take 5 mg (2 tablets) on Monday, and 7.5 mg (3 tablets) by mouth daily all other  days, or as instructed by [facility] clinic.) 270 tablet 1   ZYRTEC ALLERGY 10 MG tablet Take 10 mg by mouth daily.     No current facility-administered medications for this encounter.    Physical Findings: The patient is in no acute distress. Patient is alert and oriented.  vitals were not taken for this visit. .  No significant changes. Lungs are clear to auscultation bilaterally. Heart has regular rate and rhythm. No palpable cervical, supraclavicular, or axillary adenopathy. Abdomen soft, non-tender, normal bowel sounds.   Lab Findings: Lab Results  Component Value Date   WBC 8.0 08/05/2022   HGB 12.1 (L) 08/05/2022   HCT 38.0 (L) 08/05/2022   MCV 83.5 08/05/2022   PLT 235 08/05/2022    Radiographic Findings: No results found.  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 A diagnosis of Lung nodule was also pertinent to this visit.   PET avid left upper lobe pulmonary nodule      The patient is recovering from the effects of radiation.  ***  Plan:  ***   *** minutes of total time was spent for this patient encounter, including preparation, face-to-face counseling with the patient and coordination of care, physical exam, and documentation of the encounter. ____________________________________  Billie Lade, PhD, MD  This document serves as a record of services personally performed by Antony Blackbird, MD. It was created on his behalf by Neena Rhymes, a trained medical scribe. The creation of this record is based on the scribe's personal observations and the provider's statements to them. This document has been checked and approved by the attending provider.

## 2022-09-16 ENCOUNTER — Ambulatory Visit
Admission: RE | Admit: 2022-09-16 | Discharge: 2022-09-16 | Disposition: A | Discharge status: Home / Self Care | Payer: Medicare Other | Source: Ambulatory Visit | Attending: Radiation Oncology | Admitting: Radiation Oncology

## 2022-09-16 ENCOUNTER — Encounter: Payer: Self-pay | Admitting: Radiation Oncology

## 2022-09-16 ENCOUNTER — Encounter (HOSPITAL_COMMUNITY)
Admission: RE | Admit: 2022-09-16 | Discharge: 2022-09-16 | Disposition: A | Discharge status: Home / Self Care | Payer: Medicare Other | Source: Ambulatory Visit | Attending: Internal Medicine | Admitting: Internal Medicine

## 2022-09-16 VITALS — BP 94/83 | HR 72 | Temp 98.1°F | Resp 20 | Ht 72.0 in | Wt 208.2 lb

## 2022-09-16 DIAGNOSIS — R911 Solitary pulmonary nodule: Secondary | ICD-10-CM

## 2022-09-16 DIAGNOSIS — Z923 Personal history of irradiation: Secondary | ICD-10-CM | POA: Diagnosis not present

## 2022-09-16 DIAGNOSIS — C3412 Malignant neoplasm of upper lobe, left bronchus or lung: Secondary | ICD-10-CM | POA: Insufficient documentation

## 2022-09-16 DIAGNOSIS — I2102 ST elevation (STEMI) myocardial infarction involving left anterior descending coronary artery: Secondary | ICD-10-CM

## 2022-09-16 HISTORY — DX: Personal history of irradiation: Z92.3

## 2022-09-16 NOTE — Progress Notes (Signed)
Brandon [name] is here today for follow up post radiation to the lung.  Lung Side: Left,patient completed treatment on 08/16/22  Does the patient complain of any of the following: Pain:No Shortness of breath w/wo exertion: no Cough: No Hemoptysis: No Pain with swallowing: No Swallowing/choking concerns: No Appetite: Good Energy Level: Good Post radiation skin Changes: No    Additional comments if applicable:   BP 94/83   Pulse 72   Temp 98.1 F (36.7 C)   Resp 20   Ht 6' (1.829 m)   Wt 208 lb 3.2 oz (94.4 kg)   SpO2 98%   BMI 28.24 kg/m

## 2022-09-18 ENCOUNTER — Encounter (HOSPITAL_COMMUNITY)
Admission: RE | Admit: 2022-09-18 | Discharge: 2022-09-18 | Disposition: A | Discharge status: Home / Self Care | Payer: Medicare Other | Source: Ambulatory Visit | Attending: Internal Medicine | Admitting: Internal Medicine

## 2022-09-18 DIAGNOSIS — I2102 ST elevation (STEMI) myocardial infarction involving left anterior descending coronary artery: Secondary | ICD-10-CM | POA: Diagnosis not present

## 2022-09-18 DIAGNOSIS — I5042 Chronic combined systolic (congestive) and diastolic (congestive) heart failure: Secondary | ICD-10-CM

## 2022-09-18 NOTE — Progress Notes (Signed)
Cardiac Individual Treatment Plan  Patient Details  Name: Brandon [name] MRN: [id number] Date of Birth: 1955/09/29 Referring Provider:   Flowsheet Row INTENSIVE CARDIAC REHAB ORIENT from 07/04/2022 in [facility]  Referring Provider Dr. Gala Romney       Initial Encounter Date:  Flowsheet Row INTENSIVE CARDIAC REHAB ORIENT from 07/04/2022 in [facility]  Date 07/04/22       Visit Diagnosis: 05/06/22 STEMI, Med TX, 04/22/22 VFIB  Heart failure, systolic and diastolic, chronic (HCC)  Patient's Home Medications on Admission:  Current Outpatient Medications:    acetaminophen (TYLENOL) 500 MG tablet, Take 500-1,000 mg by mouth every 6 (six) hours as needed (for pain)., Disp: , Rfl:    aspirin EC 81 MG tablet, Take 1 tablet (81 mg total) by mouth daily. Swallow whole., Disp: 90 tablet, Rfl: 1   atorvastatin (LIPITOR) 80 MG tablet, PATIENT MUST ATTEND ANNUAL APPOINTMENT FOR FUTURE REFILLS TAKE 1 TABLET BY MOUTH ONCE DAILY AT 6PM., Disp: 30 tablet, Rfl: 0   Carboxymethylcellulose Sodium (ARTIFICIAL TEARS OP), Place 1 drop into both eyes daily as needed (for dryness or irritation)., Disp: , Rfl:    eplerenone (INSPRA) 50 MG tablet, Take 1 tablet (50 mg total) by mouth in the morning., Disp: 30 tablet, Rfl: 6   ezetimibe (ZETIA) 10 MG tablet, TAKE ONE TABLET BY MOUTH DAILY, Disp: 90 tablet, Rfl: 3   gabapentin (NEURONTIN) 300 MG capsule, Take 1 capsule (300 mg total) by mouth 3 (three) times daily., Disp: 90 capsule, Rfl: 3   KLOR-CON M20 20 MEQ tablet, TAKE 1 TABLET BY MOUTH EVERY DAY, Disp: 90 tablet, Rfl: 3   losartan (COZAAR) 25 MG tablet, Take 0.5 tablets (12.5 mg total) by mouth daily., Disp: 30 tablet, Rfl: 5   metoprolol succinate (TOPROL-XL) 25 MG 24 hr tablet, Take 0.5 tablets (12.5 mg total) by mouth daily., Disp: 45 tablet, Rfl: 1   ondansetron (ZOFRAN) 4 MG tablet,  Take 1 tablet (4 mg total) by mouth every 8 (eight) hours as needed for nausea or vomiting., Disp: 20 tablet, Rfl: 3   pantoprazole (PROTONIX) 40 MG tablet, Take 1 tablet (40 mg total) by mouth 2 (two) times daily., Disp: 60 tablet, Rfl: 6   sertraline (ZOLOFT) 50 MG tablet, TAKE 1/2 TAB BY MOUTH DAILY FOR 1 WEEK, THEN INCREASE TO 1 TABLET DAILY., Disp: 90 tablet, Rfl: 3   tamsulosin (FLOMAX) 0.4 MG CAPS capsule, TAKE 1 CAPSULE BY MOUTH EVERY DAY, Disp: 90 capsule, Rfl: 1   torsemide (DEMADEX) 20 MG tablet, Take 1 tablet (20 mg total) by mouth as needed., Disp: 30 tablet, Rfl: 5   traMADol (ULTRAM) 50 MG tablet, TAKE 1-2 TABLETS BY MOUTH TWICE A DAY FOR 30 DAYS, Disp: 120 tablet, Rfl: 1   warfarin (COUMADIN) 2.5 MG tablet, Take 7.5 mg (3 tablets) by mouth daily or as instructed by LVAD clinic. (Patient taking differently: Take 2.5 mg by mouth daily at 4 PM. Take 5 mg (2 tablets) on Monday, and 7.5 mg (3 tablets) by mouth daily all other days, or as instructed by LVAD clinic.), Disp: 270 tablet, Rfl: 1   ZYRTEC ALLERGY 10 MG tablet, Take 10 mg by mouth daily., Disp: , Rfl:   Past Medical History: Past Medical History:  Diagnosis Date   AICD (automatic cardioverter/defibrillator) present 08/31/2019   Ankylosing spondylitis (HCC)    Arthritis    BENIGN PROSTATIC HYPERTROPHY  06/07/2008   CHF (congestive heart failure) (HCC)    COLONIC POLYPS, HX OF 06/07/2008   Coronary artery disease    Depression    H/O hiatal hernia    Heart failure (HCC)    History of radiation therapy    Left Lung- 08/06/22-08/16/22- Dr. Antony Blackbird   HYPERLIPIDEMIA 06/07/2008   HYPERTENSION 06/07/2008   Myocardial infarction [facility]) 2005   NSTEMI, s/p LAD stent   NEPHROLITHIASIS, HX OF 06/07/2008   STEMI (ST elevation myocardial infarction) (HCC) 04/27/2019    Tobacco Use: Social History   Tobacco Use  Smoking Status Former   Packs/day: 1.00   Years: 40.00   Additional pack years: 0.00   Total pack years:  40.00   Types: Cigarettes   Quit date: 07/26/2019   Years since quitting: 3.1  Smokeless Tobacco Never  Tobacco Comments   1-1.5 pks per year x 40-45 yrs    Labs: Review Flowsheet  More data exists      Latest Ref Rng & Units 04/28/2021 05/04/2021 05/06/2022 05/07/2022 05/08/2022  Labs for ITP Cardiac and Pulmonary Rehab  PH, Arterial 7.35 - 7.45 - - 7.371  7.378  - -  PCO2 arterial 32 - 48 mmHg - - 31.4  36.5  - -  Bicarbonate 20.0 - 28.0 mmol/L - - 24.8  18.2  21.5  - -  TCO2 22 - 32 mmol/L - - 26  19  23   - -  Acid-base deficit 0.0 - 2.0 mmol/L - - 1.0  6.0  3.0  - -  O2 Saturation % 36.8  51.2  60  92  91  71.7  75.6     Capillary Blood Glucose: Lab Results  Component Value Date   GLUCAP 117 (H) 06/12/2022   GLUCAP 122 (H) 05/06/2022   GLUCAP 100 (H) 05/09/2021   GLUCAP 84 05/09/2021   GLUCAP 122 (H) 05/08/2021     Exercise Target Goals: Exercise Program Goal: Individual exercise prescription set using results from initial 6 min walk test and THRR while considering  patient's activity barriers and safety.   Exercise Prescription Goal: Initial exercise prescription builds to 30-45 minutes a day of aerobic activity, 2-3 days per week.  Home exercise guidelines will be given to patient during program as part of exercise prescription that the participant will acknowledge.  Activity Barriers & Risk Stratification:  Activity Barriers & Cardiac Risk Stratification - 07/04/22 1332       Activity Barriers & Cardiac Risk Stratification   Activity Barriers Joint Problems;Deconditioning;Arthritis;Back Problems;Shortness of Breath;Neck/Spine Problems;Balance Concerns    Cardiac Risk Stratification High             6 Minute Walk:  6 Minute Walk     Row Name 07/04/22 1323 09/09/22 1717       6 Minute Walk   Phase Initial Discharge    Distance 1040 feet 960 feet    Distance % Change -- -7.69 %    Distance Feet Change -- 80 ft    Walk Time 6 minutes 6 minutes    #  of Rest Breaks 0 1  4:06-5:30 due to hip pain    MPH 1.97 1.8    METS 2.38 2.26    RPE 12 14    Perceived Dyspnea  1 1    VO2 Peak 8.32 7.92    Symptoms Yes (comment) Yes (comment)    Comments bilateral hip pain 5/10 chronic. 5/10 back neck bain chronic. Relieved with rest. Rollator used midway  through walk test due to unsteadiness 8/10 R chronic hip pain, 6/10 L chronic hip pain    Resting HR 96 bpm 71 bpm    Resting BP 84/0  VAD-MAP 80/0  VAD=MAP    Resting Oxygen Saturation  97 % --    Exercise Oxygen Saturation  during 6 min walk 95 % --    Max Ex. HR 125 bpm 127 bpm    Max Ex. BP 86/0  VAD-MAP 88/0  VAD =MAP    2 Minute Post BP 80/0  VAD-MAP 86/0  VAD= MAP             Oxygen Initial Assessment:   Oxygen Re-Evaluation:   Oxygen Discharge (Final Oxygen Re-Evaluation):   Initial Exercise Prescription:  Initial Exercise Prescription - 07/04/22 1300       Date of Initial Exercise RX and Referring Provider   Date 07/04/22    Referring Provider Dr. Gala Romney    Expected Discharge Date 09/13/22      Recumbant Bike   Level 1    RPM 65    Minutes 15    METs 1.8      NuStep   Level 1    SPM 85    Minutes 15    METs 1.8      Prescription Details   Frequency (times per week) 3    Duration Progress to 30 minutes of continuous aerobic without signs/symptoms of physical distress      Intensity   THRR 40-80% of Max Heartrate 61-122    Ratings of Perceived Exertion 11-13    Perceived Dyspnea 0-4      Progression   Progression Continue progressive overload as per policy without signs/symptoms or physical distress.      Resistance Training   Training Prescription Yes    Weight 3 lbs    Reps 10-15             Perform Capillary Blood Glucose checks as needed.  Exercise Prescription Changes:   Exercise Prescription Changes     Row Name 07/04/22 1300 07/10/22 1630 07/31/22 1637 08/16/22 1635       Response to Exercise   Blood Pressure (Admit) 84/0  82/0  VAD-MAP 84/0  VAD-MAP 84/0  VAD-MAP    Blood Pressure (Exercise) -- 84/0 90/0 94/0    Blood Pressure (Exit) -- 84/0 84/0 82/0    Heart Rate (Admit) -- 118 bpm 86 bpm 104 bpm    Heart Rate (Exercise) -- 116 bpm 112 bpm 119 bpm    Heart Rate (Exit) -- 116 bpm 87 bpm 90 bpm    Rating of Perceived Exertion (Exercise) -- 11 11 11.75    Perceived Dyspnea (Exercise) -- 0 0 0    Symptoms -- Some ECG abnormalities, see nurse note Some ECG abnormalities, see nurse note Some ECG abnormalities, see nurse note    Comments -- Pt first day in the CRP2 program Reviewed MET's and goals Reviewed MEts and home ExRx    Duration -- Progress to 30 minutes of  aerobic without signs/symptoms of physical distress Progress to 30 minutes of  aerobic without signs/symptoms of physical distress Progress to 30 minutes of  aerobic without signs/symptoms of physical distress    Intensity -- THRR unchanged THRR unchanged THRR unchanged      Progression   Progression -- Continue to progress workloads to maintain intensity without signs/symptoms of physical distress. Continue to progress workloads to maintain intensity without signs/symptoms of physical distress. Continue to progress  workloads to maintain intensity without signs/symptoms of physical distress.    Average METs -- 3.4 2.75 2.95      Resistance Training   Training Prescription -- No No Yes    Weight -- -- -- 3 lbs wts    Reps -- -- -- 10-15    Time -- -- -- 10 Minutes      Recumbant Bike   Level -- 1 1 1.5    RPM -- 75 66 72    Watts -- 17 15 24     Minutes -- 15 15 15     METs -- 2.2 2 2.6      NuStep   Level -- 1 3 3     SPM -- 123 134 121    Minutes -- 15 15 15     METs -- 3.4 3.5 3.3      Home Exercise Plan   Plans to continue exercise at -- -- -- [employer] (comment)    Frequency -- -- -- Add 2 additional days to program exercise sessions.    Initial Home Exercises Provided -- -- -- 08/16/22             Exercise Comments:    Exercise Comments     Row Name 07/10/22 1638 07/31/22 1645 08/16/22 1644       Exercise Comments Pt first day in the CRP2 program. Pt tolerated exercise well with an average MET level of 2.8. See nurse note about some ECG abnormalities. Pt did good on his first day and and is learning his THRR, RPE and ExRx Reviewed MET's and goals. Pt tolerated exercise well with an average MET level of 2.75. See nurse note about some ECG abnormalities. Pt is feeling good about his goals so far and feels like he is doing good with his diet and adding in more variety. Has still had some reaccuring SOB and lightheadness. But he has found he recovers more quickly. Nurse aware. Will review home ExRx after evaluation of symptoms Reviewed MET's and home ExRx. Pt tolerated exercise well with an average MET level of 2.95. Pt will continue to exercise by going to [employer] 2-3 days a week. Reviewed gradual weight progression and monitoring for symptoms.              Exercise Goals and Review:   Exercise Goals     Row Name 07/04/22 1347             Exercise Goals   Increase Physical Activity Yes       Intervention Provide advice, education, support and counseling about physical activity/exercise needs.;Develop an individualized exercise prescription for aerobic and resistive training based on initial evaluation findings, risk stratification, comorbidities and participant's personal goals.       Expected Outcomes Short Term: Attend rehab on a regular basis to increase amount of physical activity.;Long Term: Add in home exercise to make exercise part of routine and to increase amount of physical activity.;Long Term: Exercising regularly at least 3-5 days a week.       Able to understand and use rate of perceived exertion (RPE) scale Yes       Intervention Provide education and explanation on how to use RPE scale       Expected Outcomes Short Term: Able to use RPE daily in rehab to express subjective  intensity level;Long Term:  Able to use RPE to guide intensity level when exercising independently       Knowledge and understanding of Target Heart Rate  Range (THRR) Yes       Intervention Provide education and explanation of THRR including how the numbers were predicted and where they are located for reference       Expected Outcomes Short Term: Able to state/look up THRR;Long Term: Able to use THRR to govern intensity when exercising independently;Short Term: Able to use daily as guideline for intensity in rehab       Understanding of Exercise Prescription Yes       Intervention Provide education, explanation, and written materials on patient's individual exercise prescription       Expected Outcomes Short Term: Able to explain program exercise prescription;Long Term: Able to explain home exercise prescription to exercise independently                Exercise Goals Re-Evaluation :  Exercise Goals Re-Evaluation     Row Name 07/10/22 1635 07/31/22 1642 08/16/22 1639         Exercise Goal Re-Evaluation   Exercise Goals Review Increase Physical Activity;Understanding of Exercise Prescription;Increase Strength and Stamina;Knowledge and understanding of Target Heart Rate Range (THRR);Able to understand and use rate of perceived exertion (RPE) scale Increase Physical Activity;Understanding of Exercise Prescription;Increase Strength and Stamina;Knowledge and understanding of Target Heart Rate Range (THRR);Able to understand and use rate of perceived exertion (RPE) scale Increase Physical Activity;Understanding of Exercise Prescription;Increase Strength and Stamina;Knowledge and understanding of Target Heart Rate Range (THRR);Able to understand and use rate of perceived exertion (RPE) scale     Comments Pt first day in the CRP2 program. Pt tolerated exercise well with an average MET level of 2.8. See nurse note about some ECG abnormalities. Pt did good on his first day and and is learning his THRR,  RPE and ExRx Reviewed MET's and goals. Pt tolerated exercise well with an average MET level of 2.75. See nurse note about some ECG abnormalities. Pt is feeling good about his goals so far and feels like he is doing good with his diet and adding in more variety. Has still had some reaccuring SOB and lightheadness. But he has found he recovers more quickly. Nurse aware. Will review home ExRx after evaluation of symptoms Reviewed MET's and home ExRx. Pt tolerated exercise well with an average MET level of 2.95. Pt will continue to exercise by going to [employer] 2-3 days a week. Reviewed gradual weight progression and monitoring for symptoms.     Expected Outcomes Will continue to monitor pt and progress workloads as tolerated without sign or symptom Will continue to monitor pt and progress workloads as tolerated without sign or symptom Will continue to monitor pt and progress workloads as tolerated without sign or symptom              Discharge Exercise Prescription (Final Exercise Prescription Changes):  Exercise Prescription Changes - 08/16/22 1635       Response to Exercise   Blood Pressure (Admit) 84/0   VAD-MAP   Blood Pressure (Exercise) 94/0    Blood Pressure (Exit) 82/0    Heart Rate (Admit) 104 bpm    Heart Rate (Exercise) 119 bpm    Heart Rate (Exit) 90 bpm    Rating of Perceived Exertion (Exercise) 11.75    Perceived Dyspnea (Exercise) 0    Symptoms Some ECG abnormalities, see nurse note    Comments Reviewed MEts and home ExRx    Duration Progress to 30 minutes of  aerobic without signs/symptoms of physical distress    Intensity THRR unchanged  Progression   Progression Continue to progress workloads to maintain intensity without signs/symptoms of physical distress.    Average METs 2.95      Resistance Training   Training Prescription Yes    Weight 3 lbs wts    Reps 10-15    Time 10 Minutes      Recumbant Bike   Level 1.5    RPM 72    Watts 24    Minutes 15     METs 2.6      NuStep   Level 3    SPM 121    Minutes 15    METs 3.3      Home Exercise Plan   Plans to continue exercise at [employer] (comment)    Frequency Add 2 additional days to program exercise sessions.    Initial Home Exercises Provided 08/16/22             Nutrition:  Target Goals: Understanding of nutrition guidelines, daily intake of sodium 1500mg , cholesterol 200mg , calories 30% from fat and 7% or less from saturated fats, daily to have 5 or more servings of fruits and vegetables.  Biometrics:  Pre Biometrics - 07/10/22 1642       Pre Biometrics   Height 6' (1.829 m)    Weight 96 kg    Waist Circumference 42 inches    Hip Circumference 41 inches    Waist to Hip Ratio 1.02 %    BMI (Calculated) 28.7    Triceps Skinfold 6 mm    % Body Fat 24.8 %    Grip Strength 43 kg    Flexibility --   Pt unable to reach   Single Leg Stand 11 seconds             Post Biometrics - 09/09/22 1720        Post  Biometrics   Height 6' (1.829 m)    Weight 96.5 kg    Waist Circumference 42.5 inches    Hip Circumference 42 inches    Waist to Hip Ratio 1.01 %    BMI (Calculated) 28.85    Triceps Skinfold 10 mm    % Body Fat 27.1 %    Grip Strength 42 kg    Flexibility 0 in   not done due to hip pain   Single Leg Stand 4.62 seconds             Nutrition Therapy Plan and Nutrition Goals:  Nutrition Therapy & Goals - 09/06/22 1556       Nutrition Therapy   Diet Heart Healthy Diet    Drug/Food Interactions Statins/Certain Fruits;Coumadin/Vit K      Personal Nutrition Goals   Nutrition Goal Patient to identify strategies for reducing cardiovascular risk by attending the weekly [employer] education and nutrition series    Personal Goal #2 Patient to improve diet quality by using the plate method as a daily guide for meal planning to include lean protein/plant protein, fruits, vegetables, whole grains, nonfat dairy as part of well balanced diet     Personal Goal #3 Patient to reduce sodium to 1500mg  per day    Comments Goals in action. Ina continues to attend the [employer] and nutrition series regularly. He continues a wide variety of foods including lean protein, fruits, vegetables, and whole grains. He has better understanding of reading food labels for sodium and sugar. Sony enjoys cooking and continues to work toward high fiber intake and mindfulness of saturated fat intake. He has  maintained his weight since starting with our program. Kwaku will continue to benefit from participation in intensive cardiac rehab for nutrition, exercise, and lifestyle modification.      Intervention Plan   Intervention Prescribe, educate and counsel regarding individualized specific dietary modifications aiming towards targeted core components such as weight, hypertension, lipid management, diabetes, heart failure and other comorbidities.;Nutrition handout(s) given to patient.    Expected Outcomes Short Term Goal: Understand basic principles of dietary content, such as calories, fat, sodium, cholesterol and nutrients.;Long Term Goal: Adherence to prescribed nutrition plan.             Nutrition Assessments:  Nutrition Assessments - 09/12/22 1629       Rate Your Plate Scores   Post Score 61            MEDIFICTS Score Key: ?70 Need to make dietary changes  40-70 Heart Healthy Diet ? 40 Therapeutic Level Cholesterol Diet   Flowsheet Row INTENSIVE CARDIAC REHAB from 09/11/2022 in [facility]  Picture Your Plate Total Score on Discharge 61      Picture Your Plate Scores: <45 Unhealthy dietary pattern with much room for improvement. 41-50 Dietary pattern unlikely to meet recommendations for good health and room for improvement. 51-60 More healthful dietary pattern, with some room for improvement.  >60 Healthy dietary pattern, although there may be some specific behaviors  that could be improved.    Nutrition Goals Re-Evaluation:  Nutrition Goals Re-Evaluation     Row Name 07/10/22 1612 08/07/22 1123 09/06/22 1556         Goals   Current Weight 211 lb 10.3 oz (96 kg) 211 lb 6.7 oz (95.9 kg) 210 lb 8.6 oz (95.5 kg)     Comment continues regular follow-up with anti-coagulation clinic hemoglobin 12.1; continues regular follow-up with anti-coagulation clinic continues regular follow-up with anti-coagulation clinic     Expected Outcome Namon has previously completed pulmonary and cardiac rehab. Jahbari enjoys cooking and continues to work toward high fiber intake and mindfulness of saturated fat intake. Ryu will continue to benefit from participation in intensive cardiac rehab for nutrition, exercise, and lifestyle modification. Goals in action. Candido continues to attend the [employer] and nutrition series regularly. He continues a wide variety of foods including lean protein, fruits, vegetables, and whole grains. He has better understanding of reading food labels for sodium and sugar. Aravind enjoys cooking and continues to work toward high fiber intake and mindfulness of saturated fat intake. He continues treatment/follow-up for lung nodule with radiation oncology. He has maintained his weight since starting with our program. Demarrius will continue to benefit from participation in intensive cardiac rehab for nutrition, exercise, and lifestyle modification. Goals in action. Zechariah continues to attend the [employer] and nutrition series regularly. He continues a wide variety of foods including lean protein, fruits, vegetables, and whole grains. He has better understanding of reading food labels for sodium and sugar. Demecio enjoys cooking and continues to work toward high fiber intake and mindfulness of saturated fat intake. He has maintained his weight since starting with our program. Sotirios will continue to benefit from participation in intensive cardiac rehab for  nutrition, exercise, and lifestyle modification.              Nutrition Goals Re-Evaluation:  Nutrition Goals Re-Evaluation     Row Name 07/10/22 1612 08/07/22 1123 09/06/22 1556         Goals   Current Weight  211 lb 10.3 oz (96 kg) 211 lb 6.7 oz (95.9 kg) 210 lb 8.6 oz (95.5 kg)     Comment continues regular follow-up with anti-coagulation clinic hemoglobin 12.1; continues regular follow-up with anti-coagulation clinic continues regular follow-up with anti-coagulation clinic     Expected Outcome Eljay has previously completed pulmonary and cardiac rehab. Tanveer enjoys cooking and continues to work toward high fiber intake and mindfulness of saturated fat intake. Sierra will continue to benefit from participation in intensive cardiac rehab for nutrition, exercise, and lifestyle modification. Goals in action. Husayn continues to attend the [employer] and nutrition series regularly. He continues a wide variety of foods including lean protein, fruits, vegetables, and whole grains. He has better understanding of reading food labels for sodium and sugar. Tai enjoys cooking and continues to work toward high fiber intake and mindfulness of saturated fat intake. He continues treatment/follow-up for lung nodule with radiation oncology. He has maintained his weight since starting with our program. Gaines will continue to benefit from participation in intensive cardiac rehab for nutrition, exercise, and lifestyle modification. Goals in action. Teandre continues to attend the [employer] and nutrition series regularly. He continues a wide variety of foods including lean protein, fruits, vegetables, and whole grains. He has better understanding of reading food labels for sodium and sugar. Galileo enjoys cooking and continues to work toward high fiber intake and mindfulness of saturated fat intake. He has maintained his weight since starting with our program. Gerald will continue to benefit from  participation in intensive cardiac rehab for nutrition, exercise, and lifestyle modification.              Nutrition Goals Discharge (Final Nutrition Goals Re-Evaluation):  Nutrition Goals Re-Evaluation - 09/06/22 1556       Goals   Current Weight 210 lb 8.6 oz (95.5 kg)    Comment continues regular follow-up with anti-coagulation clinic    Expected Outcome Goals in action. Kawai continues to attend the [employer] and nutrition series regularly. He continues a wide variety of foods including lean protein, fruits, vegetables, and whole grains. He has better understanding of reading food labels for sodium and sugar. Sohail enjoys cooking and continues to work toward high fiber intake and mindfulness of saturated fat intake. He has maintained his weight since starting with our program. Aldon will continue to benefit from participation in intensive cardiac rehab for nutrition, exercise, and lifestyle modification.             Psychosocial: Target Goals: Acknowledge presence or absence of significant depression and/or stress, maximize coping skills, provide positive support system. Participant is able to verbalize types and ability to use techniques and skills needed for reducing stress and depression.  Initial Review & Psychosocial Screening:  Initial Psych Review & Screening - 07/04/22 1348       Initial Review   Current issues with Current Stress Concerns;History of Depression;Current Anxiety/Panic    Source of Stress Concerns Chronic Illness    Comments Daequan has a history of depression currently taking Zoloft. Maciel says he is grinding his teeth at night. Wants to discuss weaning off Zoloft. Hazle admits to experiencing some anxiety since his two hospitaliztoins in December.      Family Dynamics   Good Support System? Yes   Terris lives alone he has a sister who lives in the area for support     Barriers   Psychosocial barriers to participate in program The patient should  benefit from training in stress management  and relaxation.      Screening Interventions   Interventions Encouraged to exercise    Expected Outcomes Long Term Goal: Stressors or current issues are controlled or eliminated.;Short Term goal: Identification and review with participant of any Quality of Life or Depression concerns found by scoring the questionnaire.             Quality of Life Scores:  Quality of Life - 09/10/22 0805       Quality of Life   Select Quality of Life      Quality of Life Scores   Health/Function Pre 18.7 %    Health/Function Post 25.77 %    Health/Function % Change 37.81 %    Socioeconomic Pre 20.86 %    Socioeconomic Post 26.93 %    Socioeconomic % Change  29.1 %    Psych/Spiritual Pre 16.57 %    Psych/Spiritual Post 23.86 %    Psych/Spiritual % Change 44 %    Family Pre 17 %    Family Post 23.75 %    Family % Change 39.71 %    GLOBAL Pre 18.5 %    GLOBAL Post 25.36 %    GLOBAL % Change 37.08 %            Scores of 19 and below usually indicate a poorer quality of life in these areas.  A difference of  2-3 points is a clinically meaningful difference.  A difference of 2-3 points in the total score of the Quality of Life Index has been associated with significant improvement in overall quality of life, self-image, physical symptoms, and general health in studies assessing change in quality of life.  PHQ-9: Review Flowsheet  More data exists      09/18/2022 07/26/2022 07/15/2022 07/04/2022 02/22/2022  Depression screen PHQ 2/9  Decreased Interest 0 0 0 0 0  Down, Depressed, Hopeless 0 0 0 0 0  PHQ - 2 Score 0 0 0 0 0  Altered sleeping 0 - - 1 0  Tired, decreased energy 1 - - 0 0  Change in appetite 0 - - 0 0  Feeling bad or failure about yourself  0 - - 0 0  Trouble concentrating 0 - - 0 0  Moving slowly or fidgety/restless 0 - - 0 0  Suicidal thoughts 0 - - 0 0  PHQ-9 Score 1 - - 1 0  Difficult doing work/chores Not difficult at all - -  Not difficult at all -   Interpretation of Total Score  Total Score Depression Severity:  1-4 = Minimal depression, 5-9 = Mild depression, 10-14 = Moderate depression, 15-19 = Moderately severe depression, 20-27 = Severe depression   Psychosocial Evaluation and Intervention:   Psychosocial Re-Evaluation:  Psychosocial Re-Evaluation     Row Name 07/31/22 1742 08/23/22 0836 09/18/22 1732         Psychosocial Re-Evaluation   Current issues with Current Stress Concerns;History of Depression;Current Anxiety/Panic Current Stress Concerns;History of Depression;Current Anxiety/Panic Current Stress Concerns;History of Depression;Current Anxiety/Panic     Comments Jonavan does have health related concerns no other concerns have been voiced during exercise at intensive cardiac rehab Dontrel completed his radiation treatments. Eugean continues to have a positive outlook on life Finnian completed his radiation treatments. Aron continues to have a positive outlook on life. Derald will complete intensive cardiac rehab on 09/20/22     Expected Outcomes Myzel will have decreased anxiety less stress upon completion of intensive cardiac rehab Devion will have decreased anxiety  less stress upon completion of intensive cardiac rehab Deston will have decreased anxiety less stress upon completion of intensive cardiac rehab     Interventions Encouraged to attend Cardiac Rehabilitation for the exercise;Stress management education;Encouraged to attend Pulmonary Rehabilitation for the exercise Encouraged to attend Cardiac Rehabilitation for the exercise;Stress management education;Encouraged to attend Pulmonary Rehabilitation for the exercise Encouraged to attend Cardiac Rehabilitation for the exercise;Stress management education;Encouraged to attend Pulmonary Rehabilitation for the exercise     Continue Psychosocial Services  Follow up required by staff Follow up required by staff Follow up required by staff       Initial  Review   Source of Stress Concerns Chronic Illness Chronic Illness Chronic Illness     Comments Will continue to monitor and offer support as needed Will continue to monitor and offer support as needed Will continue to monitor and offer support as needed              Psychosocial Discharge (Final Psychosocial Re-Evaluation):  Psychosocial Re-Evaluation - 09/18/22 1732       Psychosocial Re-Evaluation   Current issues with Current Stress Concerns;History of Depression;Current Anxiety/Panic    Comments Kamaehu completed his radiation treatments. Carliss continues to have a positive outlook on life. Stevenmichael will complete intensive cardiac rehab on 09/20/22    Expected Outcomes Harim will have decreased anxiety less stress upon completion of intensive cardiac rehab    Interventions Encouraged to attend Cardiac Rehabilitation for the exercise;Stress management education;Encouraged to attend Pulmonary Rehabilitation for the exercise    Continue Psychosocial Services  Follow up required by staff      Initial Review   Source of Stress Concerns Chronic Illness    Comments Will continue to monitor and offer support as needed             Vocational Rehabilitation: Provide vocational rehab assistance to qualifying candidates.   Vocational Rehab Evaluation & Intervention:  Vocational Rehab - 07/04/22 1353       Initial Vocational Rehab Evaluation & Intervention   Assessment shows need for Vocational Rehabilitation No   Ritesh is retired and does not need vocational rehab at this time            Education: Education Goals: Education classes will be provided on a weekly basis, covering required topics. Participant will state understanding/return demonstration of topics presented.    Education     Row Name 07/10/22 1600     Education   Cardiac Education Topics Pritikin   Licensed conveyancer Nutrition   Nutrition Facts on Fat    Instruction Review Code 1- Verbalizes Understanding   Class Start Time 1400   Class Stop Time 1450   Class Time Calculation (min) 50 min    Row Name 07/17/22 1500     Education   Cardiac Education Topics Pritikin   Customer service manager   Weekly Topic Adding Flavor - Sodium-Free   Instruction Review Code 1- Verbalizes Understanding   Class Start Time 1400   Class Stop Time 1453   Class Time Calculation (min) 53 min    Row Name 07/19/22 1500     Education   Cardiac Education Topics Pritikin   Hospital doctor Education   General Education Heart Disease Risk Reduction   Instruction  Review Code 1- Verbalizes Understanding   Class Start Time 1355   Class Stop Time 1440   Class Time Calculation (min) 45 min    Row Name 07/22/22 1500     Education   Cardiac Education Topics Pritikin   Geographical information systems officer Exercise   Exercise Workshop Location manager and Fall Prevention   Instruction Review Code 1- Verbalizes Understanding   Class Start Time 1350   Class Stop Time 1432   Class Time Calculation (min) 42 min    Row Name 07/24/22 1500     Education   Cardiac Education Topics Pritikin   Customer service manager   Weekly Topic Fast and Healthy Breakfasts   Instruction Review Code 1- Verbalizes Understanding   Class Start Time 1355   Class Stop Time 1450   Class Time Calculation (min) 55 min    Row Name 07/26/22 1600     Education   Cardiac Education Topics Pritikin   Nurse, children's Dietitian   Nutrition Overview of the Pritikin Eating Plan   Instruction Review Code 1- Verbalizes Understanding   Class Start Time 1400   Class Stop Time 1436   Class Time Calculation (min) 36 min    Row Name 07/29/22 1600     Education   Cardiac  Education Topics Pritikin   Select Workshops     Workshops   Educator Exercise Physiologist   Select Psychosocial   Psychosocial Workshop Recognizing and Reducing Stress   Instruction Review Code 1- Verbalizes Understanding   Class Start Time 1405   Class Stop Time 1454   Class Time Calculation (min) 49 min    Row Name 07/31/22 1600     Education   Cardiac Education Topics Pritikin   Customer service manager   Weekly Topic Personalizing Your Pritikin Plate   Instruction Review Code 1- Verbalizes Understanding   Class Start Time 1400   Class Stop Time 1448   Class Time Calculation (min) 48 min    Row Name 08/02/22 1600     Education   Cardiac Education Topics Pritikin   Nurse, children's Exercise Physiologist   Select Psychosocial   Psychosocial Healthy Minds, Bodies, Hearts   Instruction Review Code 1- Verbalizes Understanding   Class Start Time 1405   Class Stop Time 1443   Class Time Calculation (min) 38 min    Row Name 08/05/22 1600     Education   Cardiac Education Topics Pritikin   Glass blower/designer Nutrition   Nutrition Workshop Label Reading   Instruction Review Code 1- Tax inspector   Class Start Time 1400   Class Stop Time 1445   Class Time Calculation (min) 45 min    Row Name 08/07/22 1600     Education   Cardiac Education Topics Pritikin   Orthoptist   Educator Dietitian   Weekly Topic Delicious Desserts   Instruction Review Code 1- Verbalizes Understanding   Class Start Time 1400   Class Stop Time 1446   Class Time Calculation (min) 46 min    Row Name 08/09/22 0847     Education  Cardiac Education Topics Pritikin   Select Core Videos     Core Videos   Educator Dietitian   Select Nutrition   Nutrition Other   Instruction Review Code 1- Verbalizes Understanding   Class Start Time  1400   Class Stop Time 1446   Class Time Calculation (min) 46 min     Cooking School   Instruction Review Code 1- [employer] Understanding    Row Name 08/12/22 1500     Education   Cardiac Education Topics Pritikin   Select Workshops     Workshops   Educator Exercise Physiologist   Select Exercise   Exercise Workshop Exercise Basics: Building Your Action Plan   Instruction Review Code 1- Verbalizes Understanding   Class Start Time 1405   Class Stop Time 1452   Class Time Calculation (min) 47 min    Row Name 08/14/22 1500     Education   Cardiac Education Topics Pritikin   Customer service manager   Weekly Topic Tasty Appetizers and Snacks   Instruction Review Code 1- Verbalizes Understanding   Class Start Time 1400   Class Stop Time 1443   Class Time Calculation (min) 43 min    Row Name 08/16/22 1500     Education   Cardiac Education Topics Pritikin   Licensed conveyancer Nutrition   Nutrition Calorie Density   Instruction Review Code 1- Verbalizes Understanding   Class Start Time 1400   Class Stop Time 1440   Class Time Calculation (min) 40 min    Row Name 08/19/22 1500     Education   Cardiac Education Topics Pritikin   Psychologist, counselling   Select Nutrition   Nutrition Nutrition Action Plan   Instruction Review Code 1- Verbalizes Understanding   Class Start Time 1405   Class Stop Time 1440   Class Time Calculation (min) 35 min    Row Name 08/21/22 1600     Education   Cardiac Education Topics Pritikin   Customer service manager   Weekly Topic Efficiency Cooking - Meals in a Snap   Instruction Review Code 1- Verbalizes Understanding   Class Start Time 1400   Class Stop Time 1445   Class Time Calculation (min) 45 min    Row Name 08/23/22 1400     Education   Cardiac Education Topics  Pritikin   Psychologist, forensic Exercise Education   Exercise Education Move It!   Instruction Review Code 1- Verbalizes Understanding   Class Start Time 1359   Class Stop Time 1433   Class Time Calculation (min) 34 min    Row Name 08/26/22 1600     Education   Cardiac Education Topics Pritikin   Select Workshops     Workshops   Educator Exercise Physiologist   Select Psychosocial   Psychosocial Workshop Focused Goals, Sustainable Changes   Instruction Review Code 1- Verbalizes Understanding   Class Start Time 1400   Class Stop Time 1441   Class Time Calculation (min) 41 min    Row Name 08/28/22 1500     Education   Cardiac Education Topics Pritikin   [employer]  [employer]   Water quality scientist   Instruction Review Code 1- Verbalizes Understanding   Class Start Time 1400   Class Stop Time 1440   Class Time Calculation (min) 40 min    Row Name 08/30/22 1600     Education   Cardiac Education Topics Pritikin   Hospital doctor Education   General Education Hypertension and Heart Disease   Instruction Review Code 1- Verbalizes Understanding   Class Start Time 1410   Class Stop Time 1455   Class Time Calculation (min) 45 min    Row Name 09/02/22 1400     Education   Cardiac Education Topics Pritikin   Psychologist, forensic Exercise Education   Exercise Education Biomechanial Limitations   Instruction Review Code 1- Verbalizes Understanding   Class Start Time 1407   Class Stop Time 1444   Class Time Calculation (min) 37 min    Row Name 09/04/22 1600     Education   Cardiac Education Topics Pritikin   Customer service manager   Weekly Topic Comforting Weekend Breakfasts   Instruction  Review Code 1- Verbalizes Understanding   Class Start Time 1355   Class Stop Time 1442   Class Time Calculation (min) 47 min    Row Name 09/06/22 1500     Education   Cardiac Education Topics Pritikin   Licensed conveyancer Nutrition   Nutrition Dining Out - Part 1   Instruction Review Code 1- Verbalizes Understanding   Class Start Time 1400   Class Stop Time 1445   Class Time Calculation (min) 45 min    Row Name 09/09/22 1500     Education   Cardiac Education Topics Pritikin   Licensed conveyancer Nutrition   Nutrition Facts on Fat   Instruction Review Code 1- Verbalizes Understanding   Class Start Time 1400   Class Stop Time 1439   Class Time Calculation (min) 39 min    Row Name 09/11/22 1500     Education   Cardiac Education Topics Pritikin   Customer service manager   Weekly Topic Fast Evening Meals   Instruction Review Code 1- Verbalizes Understanding   Class Start Time 1400   Class Stop Time 1440   Class Time Calculation (min) 40 min    Row Name 09/13/22 1500     Education   Cardiac Education Topics Pritikin   Licensed conveyancer Nutrition   Nutrition Vitamins and Minerals   Instruction Review Code 1- Verbalizes Understanding   Class Start Time 1400   Class Stop Time 1440   Class Time Calculation (min) 40 min    Row Name 09/16/22 1700     Education   Cardiac Education Topics Pritikin   Geographical information systems officer Psychosocial   Psychosocial Workshop Healthy Sleep for a Healthy Heart   Instruction Review Code 1- Verbalizes Understanding   Class Start Time [phone]  Class Stop Time 1502   Class Time Calculation (min) 57 min    Row Name 09/18/22 1500     Education   Cardiac Education Topics Pritikin   Teacher, music   Weekly Topic International Cuisine- Spotlight on the [employer] Zones   Instruction Review Code 1- Verbalizes Understanding   Class Start Time 1355   Class Stop Time 1430   Class Time Calculation (min) 35 min            Core Videos: Exercise    Move It!  Clinical staff conducted group or individual video education with verbal and written material and guidebook.  Patient learns the recommended Pritikin exercise program. Exercise with the goal of living a long, healthy life. Some of the health benefits of exercise include controlled diabetes, healthier blood pressure levels, improved cholesterol levels, improved heart and lung capacity, improved sleep, and better body composition. Everyone should speak with their doctor before starting or changing an exercise routine.  Biomechanical Limitations Clinical staff conducted group or individual video education with verbal and written material and guidebook.  Patient learns how biomechanical limitations can impact exercise and how we can mitigate and possibly overcome limitations to have an impactful and balanced exercise routine.  Body Composition Clinical staff conducted group or individual video education with verbal and written material and guidebook.  Patient learns that body composition (ratio of muscle mass to fat mass) is a key component to assessing overall fitness, rather than body weight alone. Increased fat mass, especially visceral belly fat, can put [place] at increased risk for metabolic syndrome, type 2 diabetes, heart disease, and even death. It is recommended to combine diet and exercise (cardiovascular and resistance training) to improve your body composition. Seek guidance from your physician and exercise physiologist before implementing an exercise routine.  Exercise Action Plan Clinical staff conducted group or individual video education with verbal and written material and guidebook.   Patient learns the recommended strategies to achieve and enjoy long-term exercise adherence, including variety, self-motivation, self-efficacy, and positive decision making. Benefits of exercise include fitness, good health, weight management, more energy, better sleep, less stress, and overall well-being.  Medical   Heart Disease Risk Reduction Clinical staff conducted group or individual video education with verbal and written material and guidebook.  Patient learns our heart is our most vital organ as it circulates oxygen, nutrients, white blood cells, and hormones throughout the entire body, and carries waste away. Data supports a plant-based eating plan like the Pritikin Program for its effectiveness in slowing progression of and reversing heart disease. The video provides a number of recommendations to address heart disease.   Metabolic Syndrome and Belly Fat  Clinical staff conducted group or individual video education with verbal and written material and guidebook.  Patient learns what metabolic syndrome is, how it leads to heart disease, and how one can reverse it and keep it from coming back. You have metabolic syndrome if you have 3 of the following 5 criteria: abdominal obesity, high blood pressure, high triglycerides, low HDL cholesterol, and high blood sugar.  Hypertension and Heart Disease Clinical staff conducted group or individual video education with verbal and written material and guidebook.  Patient learns that high blood pressure, or hypertension, is very common in the [place]. Hypertension is largely due to excessive salt intake, but other important risk factors include being overweight, physical inactivity, drinking too much alcohol, smoking, and not eating  enough potassium from fruits and vegetables. High blood pressure is a leading risk factor for heart attack, stroke, congestive heart failure, dementia, kidney failure, and premature death. Long-term effects of  excessive salt intake include stiffening of the arteries and thickening of heart muscle and organ damage. Recommendations include ways to reduce hypertension and the risk of heart disease.  Diseases of Our Time - Focusing on Diabetes Clinical staff conducted group or individual video education with verbal and written material and guidebook.  Patient learns why the best way to stop diseases of our time is prevention, through food and other lifestyle changes. Medicine (such as prescription pills and surgeries) is often only a Band-Aid on the problem, not a long-term solution. Most common diseases of our time include obesity, type 2 diabetes, hypertension, heart disease, and cancer. The Pritikin Program is recommended and has been proven to help reduce, reverse, and/or prevent the damaging effects of metabolic syndrome.  Nutrition   Overview of the Pritikin Eating Plan  Clinical staff conducted group or individual video education with verbal and written material and guidebook.  Patient learns about the Pritikin Eating Plan for disease risk reduction. The Pritikin Eating Plan emphasizes a wide variety of unrefined, minimally-processed carbohydrates, like fruits, vegetables, whole grains, and legumes. Go, Caution, and Stop food choices are explained. Plant-based and lean animal proteins are emphasized. Rationale provided for low sodium intake for blood pressure control, low added sugars for blood sugar stabilization, and low added fats and oils for coronary artery disease risk reduction and weight management.  Calorie Density  Clinical staff conducted group or individual video education with verbal and written material and guidebook.  Patient learns about calorie density and how it impacts the Pritikin Eating Plan. Knowing the characteristics of the food you choose will help you decide whether those foods will lead to weight gain or weight loss, and whether you want to consume more or less of them. Weight  loss is usually a side effect of the Pritikin Eating Plan because of its focus on low calorie-dense foods.  Label Reading  Clinical staff conducted group or individual video education with verbal and written material and guidebook.  Patient learns about the Pritikin recommended label reading guidelines and corresponding recommendations regarding calorie density, added sugars, sodium content, and whole grains.  Dining Out - Part 1  Clinical staff conducted group or individual video education with verbal and written material and guidebook.  Patient learns that restaurant meals can be sabotaging because they can be so high in calories, fat, sodium, and/or sugar. Patient learns recommended strategies on how to positively address this and avoid unhealthy pitfalls.  Facts on Fats  Clinical staff conducted group or individual video education with verbal and written material and guidebook.  Patient learns that lifestyle modifications can be just as effective, if not more so, as many medications for lowering your risk of heart disease. A Pritikin lifestyle can help to reduce your risk of inflammation and atherosclerosis (cholesterol build-up, or plaque, in the artery walls). Lifestyle interventions such as dietary choices and physical activity address the cause of atherosclerosis. A review of the types of fats and their impact on blood cholesterol levels, along with dietary recommendations to reduce fat intake is also included.  Nutrition Action Plan  Clinical staff conducted group or individual video education with verbal and written material and guidebook.  Patient learns how to incorporate Pritikin recommendations into their lifestyle. Recommendations include planning and keeping personal health goals in mind as an important  part of their success.  Healthy Mind-Set    Healthy Minds, Bodies, Hearts  Clinical staff conducted group or individual video education with verbal and written material and  guidebook.  Patient learns how to identify when they are stressed. Video will discuss the impact of that stress, as well as the many benefits of stress management. Patient will also be introduced to stress management techniques. The way we think, act, and feel has an impact on our hearts.  How Our Thoughts Can Heal Our Hearts  Clinical staff conducted group or individual video education with verbal and written material and guidebook.  Patient learns that negative thoughts can cause depression and anxiety. This can result in negative lifestyle behavior and serious health problems. Cognitive behavioral therapy is an effective method to help control our thoughts in order to change and improve our emotional outlook.  Additional Videos:  Exercise    Improving Performance  Clinical staff conducted group or individual video education with verbal and written material and guidebook.  Patient learns to use a non-linear approach by alternating intensity levels and lengths of time spent exercising to help burn more calories and lose more body fat. Cardiovascular exercise helps improve heart health, metabolism, hormonal balance, blood sugar control, and recovery from fatigue. Resistance training improves strength, endurance, balance, coordination, reaction time, metabolism, and muscle mass. Flexibility exercise improves circulation, posture, and balance. Seek guidance from your physician and exercise physiologist before implementing an exercise routine and learn your capabilities and proper form for all exercise.  Introduction to Yoga  Clinical staff conducted group or individual video education with verbal and written material and guidebook.  Patient learns about yoga, a discipline of the coming together of mind, breath, and body. The benefits of yoga include improved flexibility, improved range of motion, better posture and core strength, increased lung function, weight loss, and positive self-image. Yoga's  heart health benefits include lowered blood pressure, healthier heart rate, decreased cholesterol and triglyceride levels, improved immune function, and reduced stress. Seek guidance from your physician and exercise physiologist before implementing an exercise routine and learn your capabilities and proper form for all exercise.  Medical   Aging: Enhancing Your Quality of Life  Clinical staff conducted group or individual video education with verbal and written material and guidebook.  Patient learns key strategies and recommendations to stay in good physical health and enhance quality of life, such as prevention strategies, having an advocate, securing a Health Care Proxy and Power of Attorney, and keeping a list of medications and system for tracking them. It also discusses how to avoid risk for bone loss.  Biology of Weight Control  Clinical staff conducted group or individual video education with verbal and written material and guidebook.  Patient learns that weight gain occurs because we consume more calories than we burn (eating more, moving less). Even if your body weight is normal, you may have higher ratios of fat compared to muscle mass. Too much body fat puts you at increased risk for cardiovascular disease, heart attack, stroke, type 2 diabetes, and obesity-related cancers. In addition to exercise, following the Pritikin Eating Plan can help reduce your risk.  Decoding Lab Results  Clinical staff conducted group or individual video education with verbal and written material and guidebook.  Patient learns that lab test reflects one measurement whose values change over time and are influenced by many factors, including medication, stress, sleep, exercise, food, hydration, pre-existing medical conditions, and more. It is recommended to use the knowledge from  this video to become more involved with your lab results and evaluate your numbers to speak with your doctor.   Diseases of Our Time -  Overview  Clinical staff conducted group or individual video education with verbal and written material and guidebook.  Patient learns that according to the CDC, 50% to 70% of chronic diseases (such as obesity, type 2 diabetes, elevated lipids, hypertension, and heart disease) are avoidable through lifestyle improvements including healthier food choices, listening to satiety cues, and increased physical activity.  Sleep Disorders Clinical staff conducted group or individual video education with verbal and written material and guidebook.  Patient learns how good quality and duration of sleep are important to overall health and well-being. Patient also learns about sleep disorders and how they impact health along with recommendations to address them, including discussing with a physician.  Nutrition  Dining Out - Part 2 Clinical staff conducted group or individual video education with verbal and written material and guidebook.  Patient learns how to plan ahead and communicate in order to maximize their dining experience in a healthy and nutritious manner. Included are recommended food choices based on the type of restaurant the patient is visiting.   Fueling a Banker conducted group or individual video education with verbal and written material and guidebook.  There is a strong connection between our food choices and our health. Diseases like obesity and type 2 diabetes are very prevalent and are in large-part due to lifestyle choices. The Pritikin Eating Plan provides plenty of food and hunger-curbing satisfaction. It is easy to follow, affordable, and helps reduce health risks.  Menu Workshop  Clinical staff conducted group or individual video education with verbal and written material and guidebook.  Patient learns that restaurant meals can sabotage health goals because they are often packed with calories, fat, sodium, and sugar. Recommendations include strategies to plan  ahead and to communicate with the manager, chef, or server to help order a healthier meal.  Planning Your Eating Strategy  Clinical staff conducted group or individual video education with verbal and written material and guidebook.  Patient learns about the Pritikin Eating Plan and its benefit of reducing the risk of disease. The Pritikin Eating Plan does not focus on calories. Instead, it emphasizes high-quality, nutrient-rich foods. By knowing the characteristics of the foods, we choose, we can determine their calorie density and make informed decisions.  Targeting Your Nutrition Priorities  Clinical staff conducted group or individual video education with verbal and written material and guidebook.  Patient learns that lifestyle habits have a tremendous impact on disease risk and progression. This video provides eating and physical activity recommendations based on your personal health goals, such as reducing LDL cholesterol, losing weight, preventing or controlling type 2 diabetes, and reducing high blood pressure.  Vitamins and Minerals  Clinical staff conducted group or individual video education with verbal and written material and guidebook.  Patient learns different ways to obtain key vitamins and minerals, including through a recommended healthy diet. It is important to discuss all supplements you take with your doctor.   Healthy Mind-Set    Smoking Cessation  Clinical staff conducted group or individual video education with verbal and written material and guidebook.  Patient learns that cigarette smoking and tobacco addiction pose a serious health risk which affects millions of people. Stopping smoking will significantly reduce the risk of heart disease, lung disease, and many forms of cancer. Recommended strategies for quitting are covered, including working with  your doctor to develop a successful plan.  Culinary   Becoming a Set designer conducted group or  individual video education with verbal and written material and guidebook.  Patient learns that cooking at home can be healthy, cost-effective, quick, and puts them in control. Keys to cooking healthy recipes will include looking at your recipe, assessing your equipment needs, planning ahead, making it simple, choosing cost-effective seasonal ingredients, and limiting the use of added fats, salts, and sugars.  Cooking - Breakfast and Snacks  Clinical staff conducted group or individual video education with verbal and written material and guidebook.  Patient learns how important breakfast is to satiety and nutrition through the entire day. Recommendations include key foods to eat during breakfast to help stabilize blood sugar levels and to prevent overeating at meals later in the day. Planning ahead is also a key component.  Cooking - Educational psychologist conducted group or individual video education with verbal and written material and guidebook.  Patient learns eating strategies to improve overall health, including an approach to cook more at home. Recommendations include thinking of animal protein as a side on your plate rather than center stage and focusing instead on lower calorie dense options like vegetables, fruits, whole grains, and plant-based proteins, such as beans. Making sauces in large quantities to freeze for later and leaving the skin on your vegetables are also recommended to maximize your experience.  Cooking - Healthy Salads and Dressing Clinical staff conducted group or individual video education with verbal and written material and guidebook.  Patient learns that vegetables, fruits, whole grains, and legumes are the foundations of the Pritikin Eating Plan. Recommendations include how to incorporate each of these in flavorful and healthy salads, and how to create homemade salad dressings. Proper handling of ingredients is also covered. Cooking - Soups and [employer] - Soups and Desserts Clinical staff conducted group or individual video education with verbal and written material and guidebook.  Patient learns that Pritikin soups and desserts make for easy, nutritious, and delicious snacks and meal components that are low in sodium, fat, sugar, and calorie density, while high in vitamins, minerals, and filling fiber. Recommendations include simple and healthy ideas for soups and desserts.   Overview     The Pritikin Solution Program Overview Clinical staff conducted group or individual video education with verbal and written material and guidebook.  Patient learns that the results of the Pritikin Program have been documented in more than 100 articles published in peer-reviewed journals, and the benefits include reducing risk factors for (and, in some cases, even reversing) high cholesterol, high blood pressure, type 2 diabetes, obesity, and more! An overview of the three key pillars of the Pritikin Program will be covered: eating well, doing regular exercise, and having a healthy mind-set.  WORKSHOPS  Exercise: Exercise Basics: Building Your Action Plan Clinical staff led group instruction and group discussion with PowerPoint presentation and patient guidebook. To enhance the learning environment the use of posters, models and videos may be added. At the conclusion of this workshop, patients will comprehend the difference between physical activity and exercise, as well as the benefits of incorporating both, into their routine. Patients will understand the FITT (Frequency, Intensity, Time, and Type) principle and how to use it to build an exercise action plan. In addition, safety concerns and other considerations for exercise and cardiac rehab will be addressed by the presenter. The purpose of this lesson is to promote  a comprehensive and effective weekly exercise routine in order to improve patients' overall level of fitness.   Managing Heart  Disease: Your Path to a Healthier Heart Clinical staff led group instruction and group discussion with PowerPoint presentation and patient guidebook. To enhance the learning environment the use of posters, models and videos may be added.At the conclusion of this workshop, patients will understand the anatomy and physiology of the heart. Additionally, they will understand how Pritikin's three pillars impact the risk factors, the progression, and the management of heart disease.  The purpose of this lesson is to provide a high-level overview of the heart, heart disease, and how the Pritikin lifestyle positively impacts risk factors.  Exercise Biomechanics Clinical staff led group instruction and group discussion with PowerPoint presentation and patient guidebook. To enhance the learning environment the use of posters, models and videos may be added. Patients will learn how the structural parts of their bodies function and how these functions impact their daily activities, movement, and exercise. Patients will learn how to promote a neutral spine, learn how to manage pain, and identify ways to improve their physical movement in order to promote healthy living. The purpose of this lesson is to expose patients to common physical limitations that impact physical activity. Participants will learn practical ways to adapt and manage aches and pains, and to minimize their effect on regular exercise. Patients will learn how to maintain good posture while sitting, walking, and lifting.  Balance Training and Fall Prevention  Clinical staff led group instruction and group discussion with PowerPoint presentation and patient guidebook. To enhance the learning environment the use of posters, models and videos may be added. At the conclusion of this workshop, patients will understand the importance of their sensorimotor skills (vision, proprioception, and the vestibular system) in maintaining their ability to  balance as they age. Patients will apply a variety of balancing exercises that are appropriate for their current level of function. Patients will understand the common causes for poor balance, possible solutions to these problems, and ways to modify their physical environment in order to minimize their fall risk. The purpose of this lesson is to teach patients about the importance of maintaining balance as they age and ways to minimize their risk of falling.  WORKSHOPS   Nutrition:  Fueling a Ship broker led group instruction and group discussion with PowerPoint presentation and patient guidebook. To enhance the learning environment the use of posters, models and videos may be added. Patients will review the foundational principles of the Pritikin Eating Plan and understand what constitutes a serving size in each of the food groups. Patients will also learn Pritikin-friendly foods that are better choices when away from home and review make-ahead meal and snack options. Calorie density will be reviewed and applied to three nutrition priorities: weight maintenance, weight loss, and weight gain. The purpose of this lesson is to reinforce (in a group setting) the key concepts around what patients are recommended to eat and how to apply these guidelines when away from home by planning and selecting Pritikin-friendly options. Patients will understand how calorie density may be adjusted for different weight management goals.  Mindful Eating  Clinical staff led group instruction and group discussion with PowerPoint presentation and patient guidebook. To enhance the learning environment the use of posters, models and videos may be added. Patients will briefly review the concepts of the Pritikin Eating Plan and the importance of low-calorie dense foods. The concept of mindful eating will be  introduced as well as the importance of paying attention to internal hunger signals. Triggers for non-hunger  eating and techniques for dealing with triggers will be explored. The purpose of this lesson is to provide patients with the opportunity to review the basic principles of the Pritikin Eating Plan, discuss the value of eating mindfully and how to measure internal cues of hunger and fullness using the Hunger Scale. Patients will also discuss reasons for non-hunger eating and learn strategies to use for controlling emotional eating.  Targeting Your Nutrition Priorities Clinical staff led group instruction and group discussion with PowerPoint presentation and patient guidebook. To enhance the learning environment the use of posters, models and videos may be added. Patients will learn how to determine their genetic susceptibility to disease by reviewing their family history. Patients will gain insight into the importance of diet as part of an overall healthy lifestyle in mitigating the impact of genetics and other environmental insults. The purpose of this lesson is to provide patients with the opportunity to assess their personal nutrition priorities by looking at their family history, their own health history and current risk factors. Patients will also be able to discuss ways of prioritizing and modifying the Pritikin Eating Plan for their highest risk areas  Menu  Clinical staff led group instruction and group discussion with PowerPoint presentation and patient guidebook. To enhance the learning environment the use of posters, models and videos may be added. Using menus brought in from [employer], or printed from [employer], patients will apply the Pritikin dining out guidelines that were presented in the [employer] video. Patients will also be able to practice these guidelines in a variety of provided scenarios. The purpose of this lesson is to provide patients with the opportunity to practice hands-on learning of the Pritikin Dining Out guidelines with actual menus and practice  scenarios.  Label Reading Clinical staff led group instruction and group discussion with PowerPoint presentation and patient guidebook. To enhance the learning environment the use of posters, models and videos may be added. Patients will review and discuss the Pritikin label reading guidelines presented in Pritikin's Label Reading Educational series video. Using fool labels brought in from local grocery stores and markets, patients will apply the label reading guidelines and determine if the packaged food meet the Pritikin guidelines. The purpose of this lesson is to provide patients with the opportunity to review, discuss, and practice hands-on learning of the Pritikin Label Reading guidelines with actual packaged food labels. Cooking School  Pritikin's [employer] are designed to teach patients ways to prepare quick, simple, and affordable recipes at home. The importance of nutrition's role in chronic disease risk reduction is reflected in its emphasis in the overall Pritikin program. By learning how to prepare essential core Pritikin Eating Plan recipes, patients will increase control over what they eat; be able to customize the flavor of foods without the use of added salt, sugar, or fat; and improve the quality of the food they consume. By learning a set of core recipes which are easily assembled, quickly prepared, and affordable, patients are more likely to prepare more healthy foods at home. These workshops focus on convenient breakfasts, simple entres, side dishes, and desserts which can be prepared with minimal effort and are consistent with nutrition recommendations for cardiovascular risk reduction. Cooking [employer] are taught by a Armed forces logistics/support/administrative officer (RD) who has been trained by the [employer]. The chef or RD has a clear  understanding of the importance of minimizing - if not completely eliminating - added fat, sugar, and sodium in recipes. Throughout  the series of Cooking School Workshop sessions, patients will learn about healthy ingredients and efficient methods of cooking to build confidence in their capability to prepare    Cooking School weekly topics:  Adding Flavor- Sodium-Free  Fast and Healthy Breakfasts  Powerhouse Plant-Based Proteins  Satisfying Salads and Dressings  Simple Sides and Sauces  International Cuisine-Spotlight on the [employer] Zones  Delicious Desserts  Savory Soups  [employer] - Meals in a Astronomer Appetizers and Snacks  Comforting Weekend Breakfasts  One-Pot Wonders   Fast Evening Meals  Landscape architect Your Pritikin Plate  WORKSHOPS   Healthy Mindset (Psychosocial):  Focused Goals, Sustainable Changes Clinical staff led group instruction and group discussion with PowerPoint presentation and patient guidebook. To enhance the learning environment the use of posters, models and videos may be added. Patients will be able to apply effective goal setting strategies to establish at least one personal goal, and then take consistent, meaningful action toward that goal. They will learn to identify common barriers to achieving personal goals and develop strategies to overcome them. Patients will also gain an understanding of how our mind-set can impact our ability to achieve goals and the importance of cultivating a positive and growth-oriented mind-set. The purpose of this lesson is to provide patients with a deeper understanding of how to set and achieve personal goals, as well as the tools and strategies needed to overcome common obstacles which may arise along the way.  From Head to Heart: The Power of a Healthy Outlook  Clinical staff led group instruction and group discussion with PowerPoint presentation and patient guidebook. To enhance the learning environment the use of posters, models and videos may be added. Patients will be able to recognize and describe the impact of emotions and  mood on physical health. They will discover the importance of self-care and explore self-care practices which may work for them. Patients will also learn how to utilize the 4 C's to cultivate a healthier outlook and better manage stress and challenges. The purpose of this lesson is to demonstrate to patients how a healthy outlook is an essential part of maintaining good health, especially as they continue their cardiac rehab journey.  Healthy Sleep for a Healthy Heart Clinical staff led group instruction and group discussion with PowerPoint presentation and patient guidebook. To enhance the learning environment the use of posters, models and videos may be added. At the conclusion of this workshop, patients will be able to demonstrate knowledge of the importance of sleep to overall health, well-being, and quality of life. They will understand the symptoms of, and treatments for, common sleep disorders. Patients will also be able to identify daytime and nighttime behaviors which impact sleep, and they will be able to apply these tools to help manage sleep-related challenges. The purpose of this lesson is to provide patients with a general overview of sleep and outline the importance of quality sleep. Patients will learn about a few of the most common sleep disorders. Patients will also be introduced to the concept of "sleep hygiene," and discover ways to self-manage certain sleeping problems through simple daily behavior changes. Finally, the workshop will motivate patients by clarifying the links between quality sleep and their goals of heart-healthy living.   Recognizing and Reducing Stress Clinical staff led group instruction and group discussion with PowerPoint presentation and patient guidebook. To enhance  the learning environment the use of posters, models and videos may be added. At the conclusion of this workshop, patients will be able to understand the types of stress reactions, differentiate between  acute and chronic stress, and recognize the impact that chronic stress has on their health. They will also be able to apply different coping mechanisms, such as reframing negative self-talk. Patients will have the opportunity to practice a variety of stress management techniques, such as deep abdominal breathing, progressive muscle relaxation, and/or guided imagery.  The purpose of this lesson is to educate patients on the role of stress in their lives and to provide healthy techniques for coping with it.  Learning Barriers/Preferences:  Learning Barriers/Preferences - 07/04/22 1418       Learning Barriers/Preferences   Learning Barriers Sight   wears reading glasses   Learning Preferences Audio;Group Instruction;Individual Instruction;Pictoral;Skilled Demonstration;Verbal Instruction;Video;Written Material             Education Topics:  Knowledge Questionnaire Score:  Knowledge Questionnaire Score - 09/10/22 0805       Knowledge Questionnaire Score   Pre Score 25/28    Post Score 24/24             Core Components/Risk Factors/Patient Goals at Admission:  Personal Goals and Risk Factors at Admission - 07/04/22 1419       Core Components/Risk Factors/Patient Goals on Admission    Weight Management Yes    Intervention Weight Management: Develop a combined nutrition and exercise program designed to reach desired caloric intake, while maintaining appropriate intake of nutrient and fiber, sodium and fats, and appropriate energy expenditure required for the weight goal.    Expected Outcomes Short Term: Continue to assess and modify interventions until short term weight is achieved;Long Term: Adherence to nutrition and physical activity/exercise program aimed toward attainment of established weight goal    Heart Failure --    Hypertension Yes    Lipids Yes    Stress Yes             Core Components/Risk Factors/Patient Goals Review:   Goals and Risk Factor Review     Row  Name 07/31/22 1745 08/23/22 1610           Core Components/Risk Factors/Patient Goals Review   Personal Goals Review Weight Management/Obesity;Hypertension;Lipids;Stress;Heart Failure Weight Management/Obesity;Hypertension;Lipids;Stress;Heart Failure      Review Jameil is doing well with exercise at intensive cardiac rehab. Map's have been stable Stacy continues to do  well with exercise at intensive cardiac rehab. Map's and weight  have been stable. Audi continues to enjoy participating in the program      Expected Outcomes Olice will continue to participate in intensive cardiac rehab for exercise, nutrition and lifestyle modifications Dmauri will continue to participate in intensive cardiac rehab for exercise, nutrition and lifestyle modifications               Core Components/Risk Factors/Patient Goals at Discharge (Final Review):   Goals and Risk Factor Review - 08/23/22 0838       Core Components/Risk Factors/Patient Goals Review   Personal Goals Review Weight Management/Obesity;Hypertension;Lipids;Stress;Heart Failure    Review Denym continues to do  well with exercise at intensive cardiac rehab. Map's and weight  have been stable. 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 Introduction to pritikin education/ intensive cardiac rehab. Initial orientation packet reviewed with patient. 30 day ITP Review.  Haseeb is scheduled to begin intensive cardiac rehab on 07/10/22 30 day ITP Review.  Shaiquan has good attendance and participation in intensive cardiac rehab. Jadyel is doing well with exercise 30 day ITP Review.  Marvie has good attendance and participation in intensive  cardiac rehab. 30 day ITP Review.  Nicole has good attendance and participation in intensive cardiac rehab. Bush will complete cardiac rehab on 09/20/22            Comments: See ITP comments.Thayer Headings RN BSN

## 2022-09-18 NOTE — Progress Notes (Signed)
Discharge Progress Report  Patient Details  Name: Brandon [name] MRN: [id number] Date of Birth: Oct 23, 1955 Referring Provider:   Flowsheet Row INTENSIVE CARDIAC REHAB ORIENT from 07/04/2022 in [facility]  Referring Provider Dr. Gala Romney        Number of Visits: 34  Reason for Discharge:  Patient reached a stable level of exercise. Patient independent in their exercise. Patient has met program and personal goals.  Smoking History:  Social History   Tobacco Use  Smoking Status Former   Packs/day: 1.00   Years: 40.00   Additional pack years: 0.00   Total pack years: 40.00   Types: Cigarettes   Quit date: 07/26/2019   Years since quitting: 3.1  Smokeless Tobacco Never  Tobacco Comments   1-1.5 pks per year x 40-45 yrs    Diagnosis:  05/06/22 STEMI, Med [place], 04/22/22 VFIB  Heart failure, systolic and diastolic, chronic (HCC)  ADL [facility]:   Initial Exercise Prescription:  Initial Exercise Prescription - 07/04/22 1300       Date of Initial Exercise RX and Referring Provider   Date 07/04/22    Referring Provider Dr. Gala Romney    Expected Discharge Date 09/13/22      Recumbant Bike   Level 1    RPM 65    Minutes 15    METs 1.8      NuStep   Level 1    SPM 85    Minutes 15    METs 1.8      Prescription Details   Frequency (times per week) 3    Duration Progress to 30 minutes of continuous aerobic without signs/symptoms of physical distress      Intensity   THRR 40-80% of Max Heartrate 61-122    Ratings of Perceived Exertion 11-13    Perceived Dyspnea 0-4      Progression   Progression Continue progressive overload as per policy without signs/symptoms or physical distress.      Resistance Training   Training Prescription Yes    Weight 3 lbs    Reps 10-15             Discharge Exercise Prescription (Final Exercise Prescription Changes):  Exercise Prescription Changes - 09/20/22 1615        Response to Exercise   Blood Pressure (Admit) 80/0   VAD-MAP   Blood Pressure (Exercise) 84/0    Blood Pressure (Exit) 82/0    Heart Rate (Admit) 94 bpm    Heart Rate (Exercise) 119 bpm    Heart Rate (Exit) 82 bpm    Rating of Perceived Exertion (Exercise) 11.75    Perceived Dyspnea (Exercise) 0    Comments Pt graduated the [employer] program    Duration Progress to 30 minutes of  aerobic without signs/symptoms of physical distress    Intensity THRR unchanged      Progression   Progression Continue to progress workloads to maintain intensity without signs/symptoms of physical distress.    Average METs 3.15      Resistance Training   Training Prescription Yes    Weight 4 lbs wts    Reps 10-15    Time 10 Minutes      Recumbant Bike   Level 1.5    RPM 71    Watts 23    Minutes 15    METs 2.7      NuStep   Level 4    SPM 128  Minutes 15    METs 3.6      Home Exercise Plan   Plans to continue exercise at [facility] (comment)    Frequency Add 2 additional days to program exercise sessions.    Initial Home Exercises Provided 08/16/22             Functional Capacity:  6 Minute Walk     Row Name 07/04/22 1323 09/09/22 1717       6 Minute Walk   Phase Initial Discharge    Distance 1040 feet 960 feet    Distance % Change -- -7.69 %    Distance Feet Change -- 80 ft    Walk Time 6 minutes 6 minutes    # of Rest Breaks 0 1  4:06-5:30 due to hip pain    MPH 1.97 1.8    METS 2.38 2.26    RPE 12 14    Perceived Dyspnea  1 1    VO2 Peak 8.32 7.92    Symptoms Yes (comment) Yes (comment)    Comments bilateral hip pain 5/10 chronic. 5/10 back neck bain chronic. Relieved with rest. Rollator used midway through walk test due to unsteadiness 8/10 R chronic hip pain, 6/10 L chronic hip pain    Resting HR 96 bpm 71 bpm    Resting BP 84/0  VAD-MAP 80/0  VAD=MAP    Resting Oxygen Saturation  97 % --    Exercise Oxygen Saturation  during 6 min walk 95 % --     Max Ex. HR 125 bpm 127 bpm    Max Ex. BP 86/0  VAD-MAP 88/0  VAD =MAP    2 Minute Post BP 80/0  VAD-MAP 86/0  VAD= MAP             Psychological, QOL, Others - Outcomes: PHQ 2/9:    09/18/2022    4:03 PM 07/26/2022    9:28 AM 07/15/2022   12:39 PM 07/04/2022    1:21 PM 02/22/2022    1:37 PM  Depression screen PHQ 2/9  Decreased Interest 0 0 0 0 0  Down, Depressed, Hopeless 0 0 0 0 0  PHQ - 2 Score 0 0 0 0 0  Altered sleeping 0   1 0  Tired, decreased energy 1   0 0  Change in appetite 0   0 0  Feeling bad or failure about yourself  0   0 0  Trouble concentrating 0   0 0  Moving slowly or fidgety/restless 0   0 0  Suicidal thoughts 0   0 0  PHQ-9 Score 1   1 0  Difficult doing work/chores Not difficult at all   Not difficult at all     Quality of Life:  Quality of Life - 09/10/22 0805       Quality of Life   Select Quality of Life      Quality of Life Scores   Health/Function Pre 18.7 %    Health/Function Post 25.77 %    Health/Function % Change 37.81 %    Socioeconomic Pre 20.86 %    Socioeconomic Post 26.93 %    Socioeconomic % Change  29.1 %    Psych/Spiritual Pre 16.57 %    Psych/Spiritual Post 23.86 %    Psych/Spiritual % Change 44 %    Family Pre 17 %    Family Post 23.75 %    Family % Change 39.71 %    GLOBAL Pre 18.5 %  GLOBAL Post 25.36 %    GLOBAL % Change 37.08 %             Personal Goals: Goals established at orientation with interventions provided to work toward goal.  Personal Goals and Risk Factors at Admission - 07/04/22 1419       Core Components/Risk Factors/Patient Goals on Admission    Weight Management Yes    Intervention Weight Management: Develop a combined nutrition and exercise program designed to reach desired caloric intake, while maintaining appropriate intake of nutrient and fiber, sodium and fats, and appropriate energy expenditure required for the weight goal.    Expected Outcomes Short Term: Continue to assess and  modify interventions until short term weight is achieved;Long Term: Adherence to nutrition and physical activity/exercise program aimed toward attainment of established weight goal    Heart Failure --    Hypertension Yes    Lipids Yes    Stress Yes              Personal Goals Discharge:  Goals and Risk Factor Review     Row Name 07/31/22 1745 08/23/22 1610 09/26/22 1205         Core Components/Risk Factors/Patient Goals Review   Personal Goals Review Weight Management/Obesity;Hypertension;Lipids;Stress;Heart Failure Weight Management/Obesity;Hypertension;Lipids;Stress;Heart Failure Weight Management/Obesity;Hypertension;Lipids;Stress;Heart Failure     Review Pacen is doing well with exercise at intensive cardiac rehab. Map's have been stable Jagraj continues to do  well with exercise at intensive cardiac rehab. Map's and weight  have been stable. Amiere continues to enjoy participating in the program Darr did well  with exercise at intensive cardiac rehab. Lonnie completed intensive cardiac rehab on 09/18/22     Expected Outcomes Kymoni will continue to participate in intensive cardiac rehab for exercise, nutrition and lifestyle modifications Martelle will continue to participate in intensive cardiac rehab for exercise, nutrition and lifestyle modifications Evaristo will continue  exercise, follow  nutrition and lifestyle modifications upon completion of intensive cardiac rehab              Exercise Goals and Review:  Exercise Goals     Row Name 07/04/22 1347             Exercise Goals   Increase Physical Activity Yes       Intervention Provide advice, education, support and counseling about physical activity/exercise needs.;Develop an individualized exercise prescription for aerobic and resistive training based on initial evaluation findings, risk stratification, comorbidities and participant's personal goals.       Expected Outcomes Short Term: Attend rehab on a regular basis to  increase amount of physical activity.;Long Term: Add in home exercise to make exercise part of routine and to increase amount of physical activity.;Long Term: Exercising regularly at least 3-5 days a week.       Able to understand and use rate of perceived exertion (RPE) scale Yes       Intervention Provide education and explanation on how to use RPE scale       Expected Outcomes Short Term: Able to use RPE daily in rehab to express subjective intensity level;Long Term:  Able to use RPE to guide intensity level when exercising independently       Knowledge and understanding of Target Heart Rate Range (THRR) Yes       Intervention Provide education and explanation of THRR including how the numbers were predicted and where they are located for reference       Expected Outcomes Short Term: Able to state/look up  THRR;Long Term: Able to use THRR to govern intensity when exercising independently;Short Term: Able to use daily as guideline for intensity in rehab       Understanding of Exercise Prescription Yes       Intervention Provide education, explanation, and written materials on patient's individual exercise prescription       Expected Outcomes Short Term: Able to explain program exercise prescription;Long Term: Able to explain home exercise prescription to exercise independently                Exercise Goals Re-Evaluation:  Exercise Goals Re-Evaluation     Row Name 07/10/22 1635 07/31/22 1642 08/16/22 1639 09/20/22 1618       Exercise Goal Re-Evaluation   Exercise Goals Review Increase Physical Activity;Understanding of Exercise Prescription;Increase Strength and Stamina;Knowledge and understanding of Target Heart Rate Range (THRR);Able to understand and use rate of perceived exertion (RPE) scale Increase Physical Activity;Understanding of Exercise Prescription;Increase Strength and Stamina;Knowledge and understanding of Target Heart Rate Range (THRR);Able to understand and use rate of  perceived exertion (RPE) scale Increase Physical Activity;Understanding of Exercise Prescription;Increase Strength and Stamina;Knowledge and understanding of Target Heart Rate Range (THRR);Able to understand and use rate of perceived exertion (RPE) scale Increase Physical Activity;Understanding of Exercise Prescription;Increase Strength and Stamina;Knowledge and understanding of Target Heart Rate Range (THRR);Able to understand and use rate of perceived exertion (RPE) scale    Comments Pt first day in the [facility] program. Pt tolerated exercise well with an average MET level of 2.8. See nurse note about some ECG abnormalities. Pt did good on his first day and and is learning his THRR, RPE and ExRx Reviewed MET's and goals. Pt tolerated exercise well with an average MET level of 2.75. See nurse note about some ECG abnormalities. Pt is feeling good about his goals so far and feels like he is doing good with his diet and adding in more variety. Has still had some reaccuring SOB and lightheadness. But he has found he recovers more quickly. Nurse aware. Will review home ExRx after evaluation of symptoms Reviewed MET's and home ExRx. Pt tolerated exercise well with an average MET level of 2.95. Pt will continue to exercise by going to [employer] 2-3 days a week. Reviewed gradual weight progression and monitoring for symptoms. Pt graduated the [employer] today. Pt tolerated exercise well with an average MET level of 3.15. Pt will continue his exercise by going to [employer] and walking 3-6 days for 30-45 mins per session    Expected Outcomes Will continue to monitor pt and progress workloads as tolerated without sign or symptom Will continue to monitor pt and progress workloads as tolerated without sign or symptom Will continue to monitor pt and progress workloads as tolerated without sign or symptom Pt will continue to exercise on his own and gain strength             Nutrition & Weight - Outcomes:   Pre Biometrics - 07/10/22 1642       Pre Biometrics   Height 6' (1.829 m)    Weight 96 kg    Waist Circumference 42 inches    Hip Circumference 41 inches    Waist to Hip Ratio 1.02 %    BMI (Calculated) 28.7    Triceps Skinfold 6 mm    % Body Fat 24.8 %    Grip Strength 43 kg    Flexibility --   Pt unable to reach   Single Leg Stand 11 seconds  Post Biometrics - 09/09/22 1720        Post  Biometrics   Height 6' (1.829 m)    Weight 96.5 kg    Waist Circumference 42.5 inches    Hip Circumference 42 inches    Waist to Hip Ratio 1.01 %    BMI (Calculated) 28.85    Triceps Skinfold 10 mm    % Body Fat 27.1 %    Grip Strength 42 kg    Flexibility 0 in   not done due to hip pain   Single Leg Stand 4.62 seconds             Nutrition:  Nutrition Therapy & Goals - 09/06/22 1556       Nutrition Therapy   Diet Heart Healthy Diet    Drug/Food Interactions Statins/Certain Fruits;Coumadin/Vit K      Personal Nutrition Goals   Nutrition Goal Patient to identify strategies for reducing cardiovascular risk by attending the weekly [facility] education and nutrition series    Personal Goal #2 Patient to improve diet quality by using the plate method as a daily guide for meal planning to include lean protein/plant protein, fruits, vegetables, whole grains, nonfat dairy as part of well balanced diet    Personal Goal #3 Patient to reduce sodium to 1500mg  per day    Comments Goals in action. Talen continues to attend the [employer] and nutrition series regularly. He continues a wide variety of foods including lean protein, fruits, vegetables, and whole grains. He has better understanding of reading food labels for sodium and sugar. Kofi enjoys cooking and continues to work toward high fiber intake and mindfulness of saturated fat intake. He has maintained his weight since starting with our program. Raney will continue to benefit from participation in intensive  cardiac rehab for nutrition, exercise, and lifestyle modification.      Intervention Plan   Intervention Prescribe, educate and counsel regarding individualized specific dietary modifications aiming towards targeted core components such as weight, hypertension, lipid management, diabetes, heart failure and other comorbidities.;Nutrition handout(s) given to patient.    Expected Outcomes Short Term Goal: Understand basic principles of dietary content, such as calories, fat, sodium, cholesterol and nutrients.;Long Term Goal: Adherence to prescribed nutrition plan.             Nutrition Discharge:  Nutrition Assessments - 09/12/22 1629       Rate Your Plate Scores   Post Score 61             Education Questionnaire Score:  Knowledge Questionnaire Score - 09/10/22 0805       Knowledge Questionnaire Score   Pre Score 25/28    Post Score 24/24             Goals reviewed with patient; copy given to patient.Pt graduates from  Intensive/Traditional cardiac rehab program on 09/20/22  with completion of  62 exercise and education sessions. Pt maintained good attendance and progressed nicely during their participation in rehab as evidenced by increased MET level.   Medication list reconciled. Repeat  PHQ score-0  .  Pt has made significant lifestyle changes and should be commended for his success. Fernandez  achieved his goals during cardiac rehab.   Pt plans to continue exercise at the [facility]. We are proud of Asmar's progress!Thayer Headings RN BSN

## 2022-09-19 ENCOUNTER — Ambulatory Visit (HOSPITAL_COMMUNITY): Payer: Self-pay | Admitting: Pharmacist

## 2022-09-19 LAB — POCT INR: INR: 3 (ref 2.0–3.0)

## 2022-09-20 ENCOUNTER — Other Ambulatory Visit: Payer: Self-pay

## 2022-09-20 ENCOUNTER — Encounter (HOSPITAL_COMMUNITY)
Admission: RE | Admit: 2022-09-20 | Discharge: 2022-09-20 | Disposition: A | Discharge status: Home / Self Care | Payer: Medicare Other | Source: Ambulatory Visit | Attending: Internal Medicine | Admitting: Internal Medicine

## 2022-09-20 DIAGNOSIS — I2102 ST elevation (STEMI) myocardial infarction involving left anterior descending coronary artery: Secondary | ICD-10-CM | POA: Diagnosis not present

## 2022-09-20 DIAGNOSIS — Z952 Presence of prosthetic heart valve: Secondary | ICD-10-CM

## 2022-09-20 DIAGNOSIS — I5042 Chronic combined systolic (congestive) and diastolic (congestive) heart failure: Secondary | ICD-10-CM

## 2022-09-20 DIAGNOSIS — Z955 Presence of coronary angioplasty implant and graft: Secondary | ICD-10-CM

## 2022-09-20 DIAGNOSIS — Z95811 Presence of heart assist device: Secondary | ICD-10-CM

## 2022-09-20 NOTE — Progress Notes (Signed)
Discharge Progress Report  Patient Details  Name: Brandon [name] MRN: [id number] Date of Birth: [birth year] Referring Provider:   Flowsheet Row INTENSIVE CARDIAC REHAB ORIENT from 07/04/2022 in [facility]  Referring Provider Dr. Gala Romney        Number of Visits: 75  Reason for Discharge:  Patient reached a stable level of exercise. Patient independent in their exercise. Patient has met program and personal goals.  Smoking History:  Social History   Tobacco Use  Smoking Status Former   Packs/day: 1.00   Years: 40.00   Additional pack years: 0.00   Total pack years: 40.00   Types: Cigarettes   Quit date: 07/26/2019   Years since quitting: 3.1  Smokeless Tobacco Never  Tobacco Comments   1-1.5 pks per year x 40-45 yrs    Diagnosis:  05/06/22 STEMI, Med [place], 04/22/22 VFIB  Heart failure, systolic and diastolic, chronic (HCC)  04/06/21 LVAD HM3  S/P coronary artery stent placement S/P DES LAD 04/27/19  04/06/21 S/P AVR (aortic valve replacement)  ADL UCSD:   Initial Exercise Prescription:  Initial Exercise Prescription - 07/04/22 1300       Date of Initial Exercise RX and Referring Provider   Date 07/04/22    Referring Provider Dr. Gala Romney    Expected Discharge Date 09/13/22      Recumbant Bike   Level 1    RPM 65    Minutes 15    METs 1.8      NuStep   Level 1    SPM 85    Minutes 15    METs 1.8      Prescription Details   Frequency (times per week) 3    Duration Progress to 30 minutes of continuous aerobic without signs/symptoms of physical distress      Intensity   THRR 40-80% of Max Heartrate 61-122    Ratings of Perceived Exertion 11-13    Perceived Dyspnea 0-4      Progression   Progression Continue progressive overload as per policy without signs/symptoms or physical distress.      Resistance Training   Training Prescription Yes    Weight 3 lbs    Reps 10-15              Discharge Exercise Prescription (Final Exercise Prescription Changes):  Exercise Prescription Changes - 09/20/22 1615       Response to Exercise   Blood Pressure (Admit) 80/0   VAD-MAP   Blood Pressure (Exercise) 84/0    Blood Pressure (Exit) 82/0    Heart Rate (Admit) 94 bpm    Heart Rate (Exercise) 119 bpm    Heart Rate (Exit) 82 bpm    Rating of Perceived Exertion (Exercise) 11.75    Perceived Dyspnea (Exercise) 0    Comments Pt graduated the [employer] program    Duration Progress to 30 minutes of  aerobic without signs/symptoms of physical distress    Intensity THRR unchanged      Progression   Progression Continue to progress workloads to maintain intensity without signs/symptoms of physical distress.    Average METs 3.15      Resistance Training   Training Prescription Yes    Weight 4 lbs wts    Reps 10-15    Time 10 Minutes      Recumbant Bike   Level 1.5    RPM 71    Watts 23    Minutes 15  METs 2.7      NuStep   Level 4    SPM 128    Minutes 15    METs 3.6      Home Exercise Plan   Plans to continue exercise at [facility] (comment)    Frequency Add 2 additional days to program exercise sessions.    Initial Home Exercises Provided 08/16/22             Functional Capacity:  6 Minute Walk     Row Name 07/04/22 1323 09/09/22 1717       6 Minute Walk   Phase Initial Discharge    Distance 1040 feet 960 feet    Distance % Change -- -7.69 %    Distance Feet Change -- 80 ft    Walk Time 6 minutes 6 minutes    # of Rest Breaks 0 1  4:06-5:30 due to hip pain    MPH 1.97 1.8    METS 2.38 2.26    RPE 12 14    Perceived Dyspnea  1 1    VO2 Peak 8.32 7.92    Symptoms Yes (comment) Yes (comment)    Comments bilateral hip pain 5/10 chronic. 5/10 back neck bain chronic. Relieved with rest. Rollator used midway through walk test due to unsteadiness 8/10 R chronic hip pain, 6/10 L chronic hip pain    Resting HR 96 bpm 71 bpm    Resting  BP 84/0  VAD-MAP 80/0  VAD=MAP    Resting Oxygen Saturation  97 % --    Exercise Oxygen Saturation  during 6 min walk 95 % --    Max Ex. HR 125 bpm 127 bpm    Max Ex. BP 86/0  VAD-MAP 88/0  VAD =MAP    2 Minute Post BP 80/0  VAD-MAP 86/0  VAD= MAP             Psychological, QOL, Others - Outcomes: PHQ 2/9:    09/18/2022    4:03 PM 07/26/2022    9:28 AM 07/15/2022   12:39 PM 07/04/2022    1:21 PM 02/22/2022    1:37 PM  Depression screen PHQ 2/9  Decreased Interest 0 0 0 0 0  Down, Depressed, Hopeless 0 0 0 0 0  PHQ - 2 Score 0 0 0 0 0  Altered sleeping 0   1 0  Tired, decreased energy 1   0 0  Change in appetite 0   0 0  Feeling bad or failure about yourself  0   0 0  Trouble concentrating 0   0 0  Moving slowly or fidgety/restless 0   0 0  Suicidal thoughts 0   0 0  PHQ-9 Score 1   1 0  Difficult doing work/chores Not difficult at all   Not difficult at all     Quality of Life:  Quality of Life - 09/10/22 0805       Quality of Life   Select Quality of Life      Quality of Life Scores   Health/Function Pre 18.7 %    Health/Function Post 25.77 %    Health/Function % Change 37.81 %    Socioeconomic Pre 20.86 %    Socioeconomic Post 26.93 %    Socioeconomic % Change  29.1 %    Psych/Spiritual Pre 16.57 %    Psych/Spiritual Post 23.86 %    Psych/Spiritual % Change 44 %    Family Pre 17 %  Family Post 23.75 %    Family % Change 39.71 %    GLOBAL Pre 18.5 %    GLOBAL Post 25.36 %    GLOBAL % Change 37.08 %             Personal Goals: Goals established at orientation with interventions provided to work toward goal.  Personal Goals and Risk Factors at Admission - 07/04/22 1419       Core Components/Risk Factors/Patient Goals on Admission    Weight Management Yes    Intervention Weight Management: Develop a combined nutrition and exercise program designed to reach desired caloric intake, while maintaining appropriate intake of nutrient and fiber, sodium and  fats, and appropriate energy expenditure required for the weight goal.    Expected Outcomes Short Term: Continue to assess and modify interventions until short term weight is achieved;Long Term: Adherence to nutrition and physical activity/exercise program aimed toward attainment of established weight goal    Heart Failure --    Hypertension Yes    Lipids Yes    Stress Yes              Personal Goals Discharge:  Goals and Risk Factor Review     Row Name 07/31/22 1745 08/23/22 7829 09/26/22 1205         Core Components/Risk Factors/Patient Goals Review   Personal Goals Review Weight Management/Obesity;Hypertension;Lipids;Stress;Heart Failure Weight Management/Obesity;Hypertension;Lipids;Stress;Heart Failure Weight Management/Obesity;Hypertension;Lipids;Stress;Heart Failure     Review Brandon [name] is doing well with exercise at intensive cardiac rehab. Map's have been stable Brandon [name] continues to do  well with exercise at intensive cardiac rehab. Map's and weight  have been stable. Brandon [name] continues to enjoy participating in the program Brandon [name]     Expected Outcomes Dominyk will continue to participate in intensive cardiac rehab for exercise, nutrition and lifestyle modifications Brandon [name] will continue to participate in intensive cardiac rehab for exercise, nutrition and lifestyle modifications Brandon [name] will continue  exercise, follow  nutrition and lifestyle modifications upon completion of intensive cardiac rehab              Exercise Goals and Review:  Exercise Goals     Row Name 07/04/22 1347             Exercise Goals   Increase Physical Activity Yes       Intervention Provide advice, education, support and counseling about physical activity/exercise needs.;Develop an individualized exercise prescription for aerobic and resistive training based on initial evaluation findings, risk  stratification, comorbidities and participant's personal goals.       Expected Outcomes Short Term: Attend rehab on a regular basis to increase amount of physical activity.;Long Term: Add in home exercise to make exercise part of routine and to increase amount of physical activity.;Long Term: Exercising regularly at least 3-5 days a week.       Able to understand and use rate of perceived exertion (RPE) scale Yes       Intervention Provide education and explanation on how to use RPE scale       Expected Outcomes Short Term: Able to use RPE daily in rehab to express subjective intensity level;Long Term:  Able to use RPE to guide intensity level when exercising independently       Knowledge and understanding of Target Heart Rate Range (THRR) Yes       Intervention Provide education and explanation of THRR including how the numbers were  predicted and where they are located for reference       Expected Outcomes Short Term: Able to state/look up THRR;Long Term: Able to use THRR to govern intensity when exercising independently;Short Term: Able to use daily as guideline for intensity in rehab       Understanding of Exercise Prescription Yes       Intervention Provide education, explanation, and written materials on patient's individual exercise prescription       Expected Outcomes Short Term: Able to explain program exercise prescription;Long Term: Able to explain home exercise prescription to exercise independently                Exercise Goals Re-Evaluation:  Exercise Goals Re-Evaluation     Row Name 07/10/22 1635 07/31/22 1642 08/16/22 1639 09/20/22 1618       Exercise Goal Re-Evaluation   Exercise Goals Review Increase Physical Activity;Understanding of Exercise Prescription;Increase Strength and Stamina;Knowledge and understanding of Target Heart Rate Range (THRR);Able to understand and use rate of perceived exertion (RPE) scale Increase Physical Activity;Understanding of Exercise  Prescription;Increase Strength and Stamina;Knowledge and understanding of Target Heart Rate Range (THRR);Able to understand and use rate of perceived exertion (RPE) scale Increase Physical Activity;Understanding of Exercise Prescription;Increase Strength and Stamina;Knowledge and understanding of Target Heart Rate Range (THRR);Able to understand and use rate of perceived exertion (RPE) scale Increase Physical Activity;Understanding of Exercise Prescription;Increase Strength and Stamina;Knowledge and understanding of Target Heart Rate Range (THRR);Able to understand and use rate of perceived exertion (RPE) scale    Comments Pt first day in the [facility] program. Pt tolerated exercise well with an average MET level of 2.8. See nurse note about some ECG abnormalities. Pt did good on his first day and and is learning his THRR, RPE and ExRx Reviewed MET's and goals. Pt tolerated exercise well with an average MET level of 2.75. See nurse note about some ECG abnormalities. Pt is feeling good about his goals so far and feels like he is doing good with his diet and adding in more variety. Has still had some reaccuring SOB and lightheadness. But he has found he recovers more quickly. Nurse aware. Will review home ExRx after evaluation of symptoms Reviewed MET's and home ExRx. Pt tolerated exercise well with an average MET level of 2.95. Pt will continue to exercise by going to Brandon [name] 2-3 days a week. Reviewed gradual weight progression and monitoring for symptoms. Pt graduated the Brandon [name] today. Pt tolerated exercise well with an average MET level of 3.15. Pt will continue his exercise by going to Brandon [name]    Expected Outcomes Will continue to monitor pt and progress workloads as tolerated without sign or symptom Will continue to monitor pt and progress workloads as tolerated without sign or symptom Will continue to monitor pt and progress workloads as  tolerated without sign or symptom Pt will continue to exercise on his own and gain strength             Nutrition & Weight - Outcomes:  Pre Biometrics - 07/10/22 1642       Pre Biometrics   Height 6' (1.829 m)    Weight 96 kg    Waist Circumference 42 inches    Hip Circumference 41 inches    Waist to Hip Ratio 1.02 %    BMI (Calculated) 28.7    Triceps Skinfold 6 mm    % Body Fat 24.8 %    Grip  Strength 43 kg    Flexibility --   Pt unable to reach   Single Leg Stand 11 seconds             Post Biometrics - 09/09/22 1720        Post  Biometrics   Height 6' (1.829 m)    Weight 96.5 kg    Waist Circumference 42.5 inches    Hip Circumference 42 inches    Waist to Hip Ratio 1.01 %    BMI (Calculated) 28.85    Triceps Skinfold 10 mm    % Body Fat 27.1 %    Grip Strength 42 kg    Flexibility 0 in   not done due to hip pain   Single Leg Stand 4.62 seconds             Nutrition:  Nutrition Therapy & Goals - 09/06/22 1556       Nutrition Therapy   Diet Heart Healthy Diet    Drug/Food Interactions Statins/Certain Fruits;Coumadin/Vit K      Personal Nutrition Goals   Nutrition Goal Patient to identify strategies for reducing cardiovascular risk by attending the weekly [employer] education and nutrition series    Personal Goal #2 Patient to improve diet quality by using the plate method as a daily guide for meal planning to include lean protein/plant protein, fruits, vegetables, whole grains, nonfat dairy as part of well balanced diet    Personal Goal #3 Patient to reduce sodium to 1500mg  per day    Comments Goals in action. Brandon [name] continues to attend the [employer] and nutrition series regularly. He continues a wide variety of foods including lean protein, fruits, vegetables, and whole grains. He has better understanding of reading food labels for sodium and sugar. Brandon [name] enjoys cooking and continues to work toward high fiber intake and mindfulness of  saturated fat intake. He has maintained his weight since starting with our program. Brandon [name] will continue to benefit from participation in intensive cardiac rehab for nutrition, exercise, and lifestyle modification.      Intervention Plan   Intervention Prescribe, educate and counsel regarding individualized specific dietary modifications aiming towards targeted core components such as weight, hypertension, lipid management, diabetes, heart failure and other comorbidities.;Nutrition handout(s) given to patient.    Expected Outcomes Short Term Goal: Understand basic principles of dietary content, such as calories, fat, sodium, cholesterol and nutrients.;Long Term Goal: Adherence to prescribed nutrition plan.             Nutrition Discharge:  Nutrition Assessments - 09/12/22 1629       Rate Your Plate Scores   Post Score 61             Education Questionnaire Score:  Knowledge Questionnaire Score - 09/10/22 0805       Knowledge Questionnaire Score   Pre Score 25/28    Post Score 24/24             Goals reviewed with patient; copy given to patient.Pt graduates from  Intensive/Traditional cardiac rehab program on 09/20/22  with completion of  62 exercise and education sessions. Pt maintained good attendance and progressed nicely during their participation in rehab as evidenced by increased MET level.   Medication list reconciled. Repeat  PHQ score-1  .  Pt has made significant lifestyle changes and should be commended for their success. Neithan achieved their goals during cardiac rehab.   Pt plans to continue exercise at the [facility]. Pater really enjoyed participating in intensive  cardiac rehab and says that cardiac rehab has been helpful for him. We are proud of Marlyn's progress!Thayer Headings RN BSN

## 2022-09-26 ENCOUNTER — Ambulatory Visit (HOSPITAL_COMMUNITY): Payer: Self-pay | Admitting: Pharmacist

## 2022-09-26 LAB — POCT INR: INR: 2.5 (ref 2.0–3.0)

## 2022-10-02 ENCOUNTER — Other Ambulatory Visit (HOSPITAL_COMMUNITY): Payer: Self-pay | Admitting: Cardiology

## 2022-10-02 NOTE — Progress Notes (Unsigned)
Cardiology Office Note:    Date:  10/03/2022   ID:  Brandon [name], DOB Oct 09, 1955, MRN [id number]  PCP:  Deeann Saint, MD  Reason for visit: Hyperlipidemia  History of Present Illness:    Brandon [name] is a 67 y.o. male with a hx of CAD s/p previous anterior MI, severe systolic HF s/p HM-3 VAD 04/06/21, severe AS s/p AVR, endocarditis, prior tobacco and alcohol use, lung cancer.  He last saw Dr. Gala Romney in March 2024 -doing well from a cardiac standpoint.  Patient was referred from HF clinic for management of his hyperlipidemia.  He was last seen by Dr. Rennis Golden in December 2021.  His LDL had improved to 69 while on atorvastatin Zetia and making lifestyle changes.  Today, patient states he just completed cardiac rehab and he will transition to the wellness center at [facility].  He discusses how it was initially difficult to him after VAD placement but now he feels remarkable.  He is now a Quarry manager for [employer].  He states he is finished 5 radiation centers for stage I lung cancer and will have a repeat scan in 3 to 4 months.  He states no current issues with his medications.  He has a neighbor that changes his driveline dressing every week.  He denies chest pain, shortness of breath, PND, orthopnea, palpitations and lower extremity edema.  He states no issues with his medications including Lipitor 80 mg and Zetia 10 mg daily.  He takes torsemide rarely.      Past Medical History:  Diagnosis Date   AICD (automatic cardioverter/defibrillator) present 08/31/2019   Ankylosing spondylitis (HCC)    Arthritis    BENIGN PROSTATIC HYPERTROPHY 06/07/2008   CHF (congestive heart failure) (HCC)    COLONIC POLYPS, HX OF 06/07/2008   Coronary artery disease    Depression    H/O hiatal hernia    Heart failure (HCC)    History of radiation therapy    Left Lung- 08/06/22-08/16/22- Dr. Antony Blackbird   HYPERLIPIDEMIA 06/07/2008   HYPERTENSION 06/07/2008   Myocardial infarction [facility]) 2005    NSTEMI, s/p LAD stent   NEPHROLITHIASIS, HX OF 06/07/2008   STEMI (ST elevation myocardial infarction) (HCC) 04/27/2019    Past Surgical History:  Procedure Laterality Date   AORTIC VALVE REPLACEMENT N/A 04/06/2021   Procedure: AORTIC VALVE REPLACEMENT (AVR) WITH INSPIRIS RESILIA  AORTIC VALVE SIZE ;  Surgeon: Alleen Borne, MD;  Location: [facility] OR;  Service: Open Heart Surgery;  Laterality: N/A;   COLONOSCOPY WITH PROPOFOL N/A 04/24/2020   Procedure: COLONOSCOPY WITH PROPOFOL;  Surgeon: Benancio Deeds, MD;  Location: [facility] ENDOSCOPY;  Service: Gastroenterology;  Laterality: N/A;   CORONARY ANGIOPLASTY WITH STENT PLACEMENT  2005   LAD stent, jailed diagonal   CORONARY/GRAFT ACUTE MI REVASCULARIZATION N/A 04/27/2019   Procedure: Coronary/Graft Acute MI Revascularization;  Surgeon: Corky Crafts, MD;  Location: [facility] INVASIVE CV LAB;  Service: Cardiovascular;  Laterality: N/A;   ICD IMPLANT  08/31/2019   ICD IMPLANT N/A 08/31/2019   Procedure: ICD IMPLANT;  Surgeon: Hillis Range, MD;  Location: [facility] INVASIVE CV LAB;  Service: Cardiovascular;  Laterality: N/A;   ICD LEAD REMOVAL N/A 12/04/2020   Procedure: ICD SYSTEM EXTRACTION;  Surgeon: Lanier Prude, MD;  Location: [facility] OR;  Service: Cardiovascular;  Laterality: N/A;   INSERTION OF IMPLANTABLE LEFT VENTRICULAR ASSIST DEVICE N/A 04/06/2021   Procedure: INSERTION OF IMPLANTABLE LEFT VENTRICULAR ASSIST DEVICE;  Surgeon: Alleen Borne, MD;  Location: [facility] OR;  Service: Open Heart Surgery;  Laterality: N/A;  HM3   IR THORACENTESIS ASP PLEURAL SPACE W/IMG GUIDE  04/19/2021   IR THORACENTESIS ASP PLEURAL SPACE W/IMG GUIDE  04/20/2021   IR THORACENTESIS ASP PLEURAL SPACE W/IMG GUIDE  04/23/2021   LAMINECTOMY     lumbar   LEFT HEART CATH AND CORONARY ANGIOGRAPHY N/A 04/27/2019   Procedure: LEFT HEART CATH AND CORONARY ANGIOGRAPHY;  Surgeon: Corky Crafts, MD;  Location: [facility] INVASIVE CV LAB;  Service: Cardiovascular;  Laterality: N/A;    LUMBAR FUSION  01/2012   POLYPECTOMY  04/24/2020   Procedure: POLYPECTOMY;  Surgeon: Benancio Deeds, MD;  Location: [facility] ENDOSCOPY;  Service: Gastroenterology;;   RIGHT HEART CATH N/A 04/27/2019   Procedure: RIGHT HEART CATH;  Surgeon: Swaziland, Mansoor M, MD;  Location: [facility] INVASIVE CV LAB;  Service: Cardiovascular;  Laterality: N/A;   RIGHT HEART CATH N/A 03/28/2021   Procedure: RIGHT HEART CATH;  Surgeon: Dolores Patty, MD;  Location: [facility] INVASIVE CV LAB;  Service: Cardiovascular;  Laterality: N/A;   RIGHT HEART CATH AND CORONARY ANGIOGRAPHY N/A 05/06/2022   Procedure: RIGHT HEART CATH AND CORONARY ANGIOGRAPHY;  Surgeon: Dolores Patty, MD;  Location: [facility] INVASIVE CV LAB;  Service: Cardiovascular;  Laterality: N/A;   RIGHT/LEFT HEART CATH AND CORONARY ANGIOGRAPHY N/A 11/29/2020   Procedure: RIGHT/LEFT HEART CATH AND CORONARY ANGIOGRAPHY;  Surgeon: Dolores Patty, MD;  Location: [facility] INVASIVE CV LAB;  Service: Cardiovascular;  Laterality: N/A;   TEE WITHOUT CARDIOVERSION N/A 12/04/2020   Procedure: TRANSESOPHAGEAL ECHOCARDIOGRAM (TEE);  Surgeon: Lanier Prude, MD;  Location: [facility] OR;  Service: Cardiovascular;  Laterality: N/A;   TEE WITHOUT CARDIOVERSION N/A 04/06/2021   Procedure: TRANSESOPHAGEAL ECHOCARDIOGRAM (TEE);  Surgeon: Alleen Borne, MD;  Location: [facility] OR;  Service: Open Heart Surgery;  Laterality: N/A;   TONSILLECTOMY     warthin tumor removal Left 2014    Current Medications: Current Meds  Medication Sig   acetaminophen (TYLENOL) 500 MG tablet Take 500-1,000 mg by mouth every 6 (six) hours as needed (for pain).   aspirin EC 81 MG tablet Take 1 tablet (81 mg total) by mouth daily. Swallow whole.   atorvastatin (LIPITOR) 80 MG tablet PATIENT MUST ATTEND ANNUAL APPOINTMENT FOR FUTURE REFILLS TAKE 1 TABLET BY MOUTH ONCE DAILY AT 6PM.   Carboxymethylcellulose Sodium (ARTIFICIAL TEARS OP) Place 1 drop into both eyes daily as needed (for dryness or irritation).    eplerenone (INSPRA) 50 MG tablet Take 1 tablet (50 mg total) by mouth in the morning.   ezetimibe (ZETIA) 10 MG tablet TAKE ONE TABLET BY MOUTH DAILY   gabapentin (NEURONTIN) 300 MG capsule Take 1 capsule (300 mg total) by mouth 3 (three) times daily.   KLOR-CON M20 20 MEQ tablet TAKE 1 TABLET BY MOUTH EVERY DAY   losartan (COZAAR) 25 MG tablet Take 0.5 tablets (12.5 mg total) by mouth daily.   metoprolol succinate (TOPROL-XL) 25 MG 24 hr tablet Take 0.5 tablets (12.5 mg total) by mouth daily.   ondansetron (ZOFRAN) 4 MG tablet Take 1 tablet (4 mg total) by mouth every 8 (eight) hours as needed for nausea or vomiting.   pantoprazole (PROTONIX) 40 MG tablet Take 1 tablet (40 mg total) by mouth 2 (two) times daily.   sertraline (ZOLOFT) 50 MG tablet TAKE 1/2 TAB BY MOUTH DAILY FOR 1 WEEK, THEN INCREASE TO 1 TABLET DAILY.   tamsulosin (FLOMAX) 0.4 MG CAPS capsule TAKE 1 CAPSULE BY MOUTH EVERY DAY   torsemide (  DEMADEX) 20 MG tablet Take 1 tablet (20 mg total) by mouth as needed.   traMADol (ULTRAM) 50 MG tablet TAKE 1-2 TABLETS BY MOUTH TWICE A DAY FOR 30 DAYS   warfarin (COUMADIN) 2.5 MG tablet Take 7.5 mg (3 tablets) by mouth daily or as instructed by LVAD clinic. (Patient taking differently: Take 2.5 mg by mouth daily at 4 PM. Take 5 mg (2 tablets) on Monday, and 7.5 mg (3 tablets) by mouth daily all other days, or as instructed by LVAD clinic.)   ZYRTEC ALLERGY 10 MG tablet Take 10 mg by mouth daily.     Allergies:   Patient has no known allergies.   Social History   Socioeconomic History   Marital status: Single    Spouse name: Not on file   Number of children: Not on file   Years of education: Not on file   Highest education level: Some college, no degree  Occupational History   Occupation: Event organiser: Consulting civil engineer   Occupation: Retired  Tobacco Use   Smoking status: Former    Packs/day: 1.00    Years: 40.00    Additional pack years: 0.00    Total pack years: 40.00     Types: Cigarettes    Quit date: 07/26/2019    Years since quitting: 3.1   Smokeless tobacco: Never   Tobacco comments:    1-1.5 pks per year x 40-45 yrs  Vaping Use   Vaping Use: Some days   Substances: CBD  Substance and Sexual Activity   Alcohol use: Yes    Alcohol/week: 7.0 standard drinks of alcohol    Types: 7 Shots of liquor per week    Comment: "2 vodka tonics a night"   Drug use: Yes    Comment: occasionally - CBD oil   Sexual activity: Not Currently  Other Topics Concern   Not on file  Social History Narrative   Lives in [place] alone      Social Determinants of Health   Financial Resource Strain: Low Risk  (07/26/2022)   Overall Financial Resource Strain (CARDIA)    Difficulty of Paying Living Expenses: Not hard at all  Food Insecurity: No Food Insecurity (07/26/2022)   Hunger Vital Sign    Worried About Running Out of Food in the Last Year: Never true    Ran Out of Food in the Last Year: Never true  Transportation Needs: No Transportation Needs (07/26/2022)   PRAPARE - Administrator, Civil Service (Medical): No    Lack of Transportation (Non-Medical): No  Physical Activity: Sufficiently Active (09/10/2021)   Exercise Vital Sign    Days of Exercise per Week: 5 days    Minutes of Exercise per Session: 30 min  Recent Concern: Physical Activity - Insufficiently Active (07/19/2021)   Exercise Vital Sign    Days of Exercise per Week: 5 days    Minutes of Exercise per Session: 20 min  Stress: No Stress Concern Present (07/26/2022)   [employer] of Occupational Health - Occupational Stress Questionnaire    Feeling of Stress : Not at all  Social Connections: Socially Isolated (07/26/2022)   Social Connection and Isolation Panel [NHANES]    Frequency of Communication with Friends and Family: More than three times a week    Frequency of Social Gatherings with Friends and Family: More than three times a week    Attends Religious Services: Never    Automotive engineer or Organizations: No  Attends Banker Meetings: Never    Marital Status: Never married     Family History: The patient's family history includes Arthritis in his sister; Depression in his father; Heart failure in his mother. There is no history of Other, Colon cancer, Esophageal cancer, Rectal cancer, Stomach cancer, or Colon polyps.  ROS:   Please see the history of present illness.     EKGs/Labs/Other Studies Reviewed:    Recent Labs: 05/12/2022: Magnesium 2.0 06/03/2022: ALT 18 08/05/2022: BUN 13; Creatinine, Ser 0.97; Hemoglobin 12.1; Platelets 235; Potassium 4.5; Sodium 136   Recent Lipid Panel Lab Results  Component Value Date/Time   CHOL 122 02/06/2021 09:32 AM   CHOL 140 04/04/2020 08:56 AM   TRIG 124 02/06/2021 09:32 AM   HDL 37 (L) 02/06/2021 09:32 AM   HDL 57 04/04/2020 08:56 AM   LDLCALC 60 02/06/2021 09:32 AM   LDLCALC 69 04/04/2020 08:56 AM    Physical Exam:    VS:   Wt Readings from Last 3 Encounters:  09/16/22 208 lb 3.2 oz (94.4 kg)  09/09/22 212 lb 11.9 oz (96.5 kg)  08/05/22 209 lb 3.2 oz (94.9 kg)     GEN:  Well nourished, well developed in no acute distress HEENT: Normal NECK: No JVD; No carotid bruits CARDIAC: VAD in place, VAD hum RESPIRATORY:  Clear to auscultation without rales, wheezing or rhonchi  ABDOMEN: Soft, non-tender, non-distended MUSCULOSKELETAL: No edema SKIN: Warm and dry; driveline site without erythema or drainage. NEUROLOGIC:  Alert and oriented PSYCHIATRIC:  Normal affect     ASSESSMENT AND PLAN   Hyperlipidemia with goal LDL <70 -Last lipids September 2022 with total cholesterol 122, LDL 60, HDL 37, triglycerides 124. -Ordered fasting lipids. -Continue Lipitor and Zetia. -Did mention if Dr. Gala Romney wanted to take over his lipid management that would be fine.  Patient states he likes following up with general cardiology. -Discussed cholesterol lowering diets - Mediterranean diet, DASH  diet, vegetarian diet, low-carbohydrate diet and avoidance of trans fats.  Discussed healthier choice substitutes.  Nuts, high-fiber foods, and fiber supplements may also improve lipids.    Disposition - Follow-up in 1 year.   Medication Adjustments/Labs and Tests Ordered: Current medicines are reviewed at length with the patient today.  Concerns regarding medicines are outlined above.  Orders Placed This Encounter  Procedures   Lipid panel   No orders of the defined types were placed in this encounter.   Patient Instructions  Medication Instructions:  No Changes *If you need a refill on your cardiac medications before your next appointment, please call your pharmacy*   Lab Work: Lipid Panel 1 week If you have labs (blood work) drawn today and your tests are completely normal, you will receive your results only by: MyChart Message (if you have MyChart) OR A paper copy in the mail If you have any lab test that is abnormal or we need to change your treatment, we will call you to review the results.   Testing/Procedures: No Testing   Follow-Up: At [facility], you and your health needs are our priority.  As part of our continuing mission to provide you with exceptional heart care, we have created designated Provider Care Teams.  These Care Teams include your primary Cardiologist (physician) and Advanced Practice Providers (APPs -  Physician Assistants and Nurse Practitioners) who all work together to provide you with the care you need, when you need it.  We recommend signing up for the patient portal called "MyChart".  Sign up information is provided on this After Visit Summary.  MyChart is used to connect with patients for Virtual Visits (Telemedicine).  Patients are able to view lab/test results, encounter notes, upcoming appointments, etc.  Non-urgent messages can be sent to your provider as well.   To learn more about what you can do with MyChart, go to  [email or web address].    Your next appointment:   1 year(s)  Provider:   Olga Millers, MD     Signed, Cannon Kettle, PA-C  10/03/2022 1:12 PM    [facility]

## 2022-10-03 ENCOUNTER — Ambulatory Visit (HOSPITAL_COMMUNITY): Payer: Self-pay | Admitting: Pharmacist

## 2022-10-03 ENCOUNTER — Ambulatory Visit: Payer: Medicare Other | Attending: Physician Assistant | Admitting: Physician Assistant

## 2022-10-03 DIAGNOSIS — E785 Hyperlipidemia, unspecified: Secondary | ICD-10-CM

## 2022-10-03 LAB — POCT INR: INR: 3.1 — AB (ref 2.0–3.0)

## 2022-10-03 NOTE — Patient Instructions (Signed)
Medication Instructions:  No Changes *If you need a refill on your cardiac medications before your next appointment, please call your pharmacy*   Lab Work: Lipid Panel 1 week If you have labs (blood work) drawn today and your tests are completely normal, you will receive your results only by: MyChart Message (if you have MyChart) OR A paper copy in the mail If you have any lab test that is abnormal or we need to change your treatment, we will call you to review the results.   Testing/Procedures: No Testing   Follow-Up: At [facility], you and your health needs are our priority.  As part of our continuing mission to provide you with exceptional heart care, we have created designated Provider Care Teams.  These Care Teams include your primary Cardiologist (physician) and Advanced Practice Providers (APPs -  Physician Assistants and Nurse Practitioners) who all work together to provide you with the care you need, when you need it.  We recommend signing up for the patient portal called "MyChart".  Sign up information is provided on this After Visit Summary.  MyChart is used to connect with patients for Virtual Visits (Telemedicine).  Patients are able to view lab/test results, encounter notes, upcoming appointments, etc.  Non-urgent messages can be sent to your provider as well.   To learn more about what you can do with MyChart, go to [email or web address].    Your next appointment:   1 year(s)  Provider:   Olga Millers, MD

## 2022-10-05 ENCOUNTER — Other Ambulatory Visit (HOSPITAL_COMMUNITY): Payer: Self-pay

## 2022-10-07 ENCOUNTER — Other Ambulatory Visit: Payer: Self-pay | Admitting: Physician Assistant

## 2022-10-08 ENCOUNTER — Telehealth: Payer: Self-pay | Admitting: *Deleted

## 2022-10-08 NOTE — Telephone Encounter (Signed)
Called patient to inform of CT for 12-17-22- arrival time- 10:45 am @ [facility], no restrictions to test, patient to receive results from Dr. Roselind Messier on 12-19-22 @ 11:45 am, spoke with patient and he is aware of these appts. and the results

## 2022-10-08 NOTE — Progress Notes (Unsigned)
  Electrophysiology Office Note:   Date:  10/08/2022  ID:  KAYEDEN VANDERSLUIS, DOB 05-14-1955, MRN [id number]  Primary Cardiologist: Olga Millers, MD Electrophysiologist: Hillis Range, MD (Inactive) ->  HF: Dr. Gala Romney   History of Present Illness:   Brandon [name] is a 67 y.o. male with h/o chronic systolic CHF s/p LVAD, ischemia CMP, Mod/Sev AS s/p AVR, HTN, and h/o endocarditis seen today for routine electrophysiology followup.   Pt last seen in our office 12/2020. Has been lost to follow up in setting of overall having his HF managed by the HF team 2/2 LVAD implantation 03/2021.  Recently underwent XR for LUL Bronchogenic CA and with no clear guidelines from CVRx, recommended in office check for impedance and battery of his Barostim.   ***   Review of systems complete and found to be negative unless listed in HPI.   Device History: Barostim (standard) implanted 12/31/2019 for Chronic systolic CHF St. Jude Single Chamber ICD implanted 08/2019 for chronic systolic CHF/primary prevention *Extracted 12/04/20 2/2 strep bacteremia* History of appropriate therapy: No History of AAD therapy: No   Studies Reviewed:    ICD Interrogation-  reviewed in detail today,  See PACEART report.  EKG is not ordered today  Physical Exam:   VS:  There were no vitals taken for this visit.   Wt Readings from Last 3 Encounters:  09/16/22 208 lb 3.2 oz (94.4 kg)  09/09/22 212 lb 11.9 oz (96.5 kg)  08/05/22 209 lb 3.2 oz (94.9 kg)     GEN: Well nourished, well developed in no acute distress NECK: No JVD; No carotid bruits CARDIAC: {EPRHYTHM:28826}, no murmurs, rubs, gallops RESPIRATORY:  Clear to auscultation without rales, wheezing or rhonchi  ABDOMEN: Soft, non-tender, non-distended EXTREMITIES:  No edema; No deformity   ASSESSMENT AND PLAN:    Chronic systolic dysfunction s/p Abbott single chamber ICD   ICD extraction 2/2 endocarditis Barostim implantation euvolemic today Stable on an  appropriate medical regimen Normal ICD function See Pace Art report Barostim ***  CAD Recurrent anterior STEMI 04/2022 felt to be embolic from AV No s/s ischemia currently  PAF  On coumadin for LVAD Amio previously stopped  {Click here to Review PMH, Prob List, Meds, Allergies, SHx, FHx  :1}   Disposition:   Follow up with {EPPROVIDERS:28135} {EPFOLLOW UP:28173}   Signed, Graciella Freer, PA-C

## 2022-10-09 ENCOUNTER — Other Ambulatory Visit (HOSPITAL_COMMUNITY): Payer: Self-pay | Admitting: *Deleted

## 2022-10-09 ENCOUNTER — Other Ambulatory Visit (HOSPITAL_COMMUNITY): Payer: Self-pay

## 2022-10-09 ENCOUNTER — Encounter: Payer: Self-pay | Admitting: Student

## 2022-10-09 ENCOUNTER — Ambulatory Visit: Payer: Medicare Other | Attending: Student | Admitting: Student

## 2022-10-09 ENCOUNTER — Telehealth: Payer: Self-pay

## 2022-10-09 VITALS — BP 94/0 | HR 62 | Ht 72.0 in | Wt 207.0 lb

## 2022-10-09 DIAGNOSIS — I2510 Atherosclerotic heart disease of native coronary artery without angina pectoris: Secondary | ICD-10-CM | POA: Insufficient documentation

## 2022-10-09 DIAGNOSIS — I5022 Chronic systolic (congestive) heart failure: Secondary | ICD-10-CM | POA: Insufficient documentation

## 2022-10-09 DIAGNOSIS — I480 Paroxysmal atrial fibrillation: Secondary | ICD-10-CM | POA: Insufficient documentation

## 2022-10-09 DIAGNOSIS — Z7901 Long term (current) use of anticoagulants: Secondary | ICD-10-CM

## 2022-10-09 DIAGNOSIS — Z95811 Presence of heart assist device: Secondary | ICD-10-CM

## 2022-10-09 LAB — LIPID PANEL
Chol/HDL Ratio: 2.6 ratio (ref 0.0–5.0)
Cholesterol, Total: 121 mg/dL (ref 100–199)
HDL: 46 mg/dL (ref >39)
LDL Chol Calc (NIH): 59 mg/dL (ref 0–99)
Triglycerides: 83 mg/dL (ref 0–149)
VLDL Cholesterol Cal: 16 mg/dL (ref 5–40)

## 2022-10-09 NOTE — Patient Instructions (Signed)
Medication Instructions:  Your physician recommends that you continue on your current medications as directed. Please refer to the Current Medication list given to you today.  *If you need a refill on your cardiac medications before your next appointment, please call your pharmacy*  Lab Work: None ordered If you have labs (blood work) drawn today and your tests are completely normal, you will receive your results only by: MyChart Message (if you have MyChart) OR A paper copy in the mail If you have any lab test that is abnormal or we need to change your treatment, we will call you to review the results.  Follow-Up: At [facility], you and your health needs are our priority.  As part of our continuing mission to provide you with exceptional heart care, we have created designated Provider Care Teams.  These Care Teams include your primary Cardiologist (physician) and Advanced Practice Providers (APPs -  Physician Assistants and Nurse Practitioners) who all work together to provide you with the care you need, when you need it.  Your next appointment:   6 month(s)  Provider:   Michael "Andy" Tillery, PA-C  

## 2022-10-09 NOTE — Telephone Encounter (Addendum)
Called patient regarding results. Patient had understanding of results.----- Message from Cannon Kettle, PA-C sent at 10/09/2022 11:31 AM EDT ----- Lipids are well-controlled -all numbers are at goal and improved from 1 year ago. -Continue Lipitor and Zetia.

## 2022-10-10 ENCOUNTER — Ambulatory Visit (HOSPITAL_COMMUNITY): Payer: Self-pay | Admitting: Pharmacist

## 2022-10-10 ENCOUNTER — Ambulatory Visit (HOSPITAL_COMMUNITY)
Admission: RE | Admit: 2022-10-10 | Discharge: 2022-10-10 | Disposition: A | Discharge status: Home / Self Care | Payer: Medicare Other | Source: Ambulatory Visit | Attending: Internal Medicine | Admitting: Internal Medicine

## 2022-10-10 ENCOUNTER — Encounter (HOSPITAL_COMMUNITY): Payer: Self-pay | Admitting: Internal Medicine

## 2022-10-10 VITALS — BP 128/70 | HR 75 | Temp 98.0°F | Ht 72.0 in | Wt 208.4 lb

## 2022-10-10 DIAGNOSIS — Z7901 Long term (current) use of anticoagulants: Secondary | ICD-10-CM | POA: Diagnosis not present

## 2022-10-10 DIAGNOSIS — I472 Ventricular tachycardia, unspecified: Secondary | ICD-10-CM | POA: Insufficient documentation

## 2022-10-10 DIAGNOSIS — I5022 Chronic systolic (congestive) heart failure: Secondary | ICD-10-CM | POA: Diagnosis not present

## 2022-10-10 DIAGNOSIS — B372 Candidiasis of skin and nail: Secondary | ICD-10-CM

## 2022-10-10 DIAGNOSIS — M459 Ankylosing spondylitis of unspecified sites in spine: Secondary | ICD-10-CM | POA: Insufficient documentation

## 2022-10-10 DIAGNOSIS — I252 Old myocardial infarction: Secondary | ICD-10-CM | POA: Insufficient documentation

## 2022-10-10 DIAGNOSIS — Z955 Presence of coronary angioplasty implant and graft: Secondary | ICD-10-CM | POA: Diagnosis not present

## 2022-10-10 DIAGNOSIS — I480 Paroxysmal atrial fibrillation: Secondary | ICD-10-CM | POA: Insufficient documentation

## 2022-10-10 DIAGNOSIS — I110 Hypertensive heart disease with heart failure: Secondary | ICD-10-CM | POA: Diagnosis present

## 2022-10-10 DIAGNOSIS — Z9889 Other specified postprocedural states: Secondary | ICD-10-CM | POA: Diagnosis not present

## 2022-10-10 DIAGNOSIS — Z923 Personal history of irradiation: Secondary | ICD-10-CM | POA: Insufficient documentation

## 2022-10-10 DIAGNOSIS — Z95811 Presence of heart assist device: Secondary | ICD-10-CM | POA: Diagnosis not present

## 2022-10-10 DIAGNOSIS — C3412 Malignant neoplasm of upper lobe, left bronchus or lung: Secondary | ICD-10-CM | POA: Diagnosis not present

## 2022-10-10 DIAGNOSIS — Z79899 Other long term (current) drug therapy: Secondary | ICD-10-CM | POA: Diagnosis not present

## 2022-10-10 DIAGNOSIS — I2510 Atherosclerotic heart disease of native coronary artery without angina pectoris: Secondary | ICD-10-CM | POA: Insufficient documentation

## 2022-10-10 DIAGNOSIS — Z952 Presence of prosthetic heart valve: Secondary | ICD-10-CM

## 2022-10-10 DIAGNOSIS — Z953 Presence of xenogenic heart valve: Secondary | ICD-10-CM | POA: Diagnosis not present

## 2022-10-10 LAB — COMPREHENSIVE METABOLIC PANEL
ALT: 19 U/L (ref 0–44)
AST: 26 U/L (ref 15–41)
Albumin: 3.9 g/dL (ref 3.5–5.0)
Alkaline Phosphatase: 130 U/L — ABNORMAL HIGH (ref 38–126)
Anion gap: 9 (ref 5–15)
BUN: 14 mg/dL (ref 8–23)
CO2: 26 mmol/L (ref 22–32)
Calcium: 9.4 mg/dL (ref 8.9–10.3)
Chloride: 102 mmol/L (ref 98–111)
Creatinine, Ser: 0.97 mg/dL (ref 0.61–1.24)
GFR, Estimated: >60 mL/min (ref >60)
Glucose, Bld: 97 mg/dL (ref 70–99)
Potassium: 5 mmol/L (ref 3.5–5.1)
Sodium: 137 mmol/L (ref 135–145)
Total Bilirubin: 0.7 mg/dL (ref 0.3–1.2)
Total Protein: 7.7 g/dL (ref 6.5–8.1)

## 2022-10-10 LAB — CBC
HCT: 42.7 % (ref 39.0–52.0)
Hemoglobin: 13 g/dL (ref 13.0–17.0)
MCH: 24.7 pg — ABNORMAL LOW (ref 26.0–34.0)
MCHC: 30.4 g/dL (ref 30.0–36.0)
MCV: 81 fL (ref 80.0–100.0)
Platelets: 198 10*3/uL (ref 150–400)
RBC: 5.27 MIL/uL (ref 4.22–5.81)
RDW: 16.1 % — ABNORMAL HIGH (ref 11.5–15.5)
WBC: 7.2 10*3/uL (ref 4.0–10.5)
nRBC: 0 % (ref 0.0–0.2)

## 2022-10-10 LAB — PROTIME-INR
INR: 2.6 — ABNORMAL HIGH (ref 0.8–1.2)
Prothrombin Time: 28.2 seconds — ABNORMAL HIGH (ref 11.4–15.2)

## 2022-10-10 LAB — PREALBUMIN: Prealbumin: 21 mg/dL (ref 18–38)

## 2022-10-10 LAB — LACTATE DEHYDROGENASE: LDH: 194 U/L — ABNORMAL HIGH (ref 98–192)

## 2022-10-10 MED ORDER — NYSTATIN 100000 UNIT/GM EX POWD
1.0000 | Freq: Three times a day (TID) | CUTANEOUS | 2 refills | Status: AC
Start: 2022-10-10 — End: ?

## 2022-10-10 NOTE — Progress Notes (Signed)
Patient presents for 2 month f/u and 1.5 year Intermacs in VAD Clinic today alone. Reports no problems with VAD equipment or concerns with drive line.  Patient says he is feeling "great" and has no limitations with activities. He completed cardiac rehab and has now joined a gym. He is really enjoying this. Denies shortness of breath, heart failure symptoms, falls, and signs of bleeding. He feels past dizzy episodes have resolved since he stopped taking diuretic. He has not taken any Torsemide since last visit.   PI events > 100 daily. Pt reports he is drinking >2 liters fluid per day. Dr. Gala Romney added Ramp echo at next visit.   Pt reports some redness under his anchor that has improved with Nystatin powder. Rx sent to local pharmacy for same.   Vital Signs:    Temp: 98.0 Doppler Pressure: 98 Automatc BP: 128/70 (91) HR: 890   SPO2:  97% on RA   Weight: 208.4 w/ ept Last weight: 209.2 lb w/ eqt Ht: 6'   VAD Interrogation: Speed: 5800 Flow: 5.0 Power: 3.7w    PI: 4.8 Alarms: none Events: >100 PI events   Fixed speed: 5800 Low speed limit: 5500  Primary controller: back up battery due for replacement in 15 months Secondary controller:  back up battery due for replacement in 16 months    I reviewed the LVAD parameters from today and compared the results to the patient's prior recorded data. LVAD interrogation was NEGATIVE for significant power changes, NEGATIVE for clinical alarms and STABLE for PI events/speed drops. No programming changes were made and pump is functioning within specified parameters. Pt is performing daily controller and system monitor self tests along with completing weekly and monthly maintenance for LVAD equipment.   LVAD equipment check completed and is in good working order. Back-up equipment  present.    Annual Equipment Maintenance on UBC/PM was performed on 06/03/22.   1.5 year Intermacs follow up completed including:  Quality of Life, KCCQ-12, and  Neurocognitive trail making.   Pt did not complete 6 minute walk due to back and hip pain (chronic).  Back up controller: 11V backup battery charged during this visit.  Patient Goals: To continue to feel as good as I do today.      Exit Site Care:  Patient presents to clinic today for drive line exit wound care. Existing VAD dressing removed and site care performed using sterile technique. Drive line exit site cleaned with Chlora prep applicators x 2, allowed to dry, and Sorbaview dressing with Silverlon patch applied. Exit site healed and incorporated, the velour is fully implanted at exit site. No redness, tenderness, drainage, foul odor or rash at site noted. He does have some skin redness under anchor (much improved from last dressing change since using Nystatin powder). He will continue to use powder with dressing changes and place anchor over tape. Drive line anchor re-applied. Pt denies fever or chills. Provided patient with 8 weekly dressing kits, 5 extra anchors, and roll of tape.   Device: N/A  (removed due to infection 12/04/20)   BP & Labs:  Doppler 110 - Doppler is reflecting modified systolic   Hgb 13.0 - No S/S of bleeding. Specifically denies melena/BRBPR or nosebleeds.   LDH stable at 194 with established baseline of 135 - 309. Denies tea-colored urine. No power elevations noted on interrogation.    Patient Instructions:  No changes in medications. Rx for Nystatin powder sent to local pharmacy as requested. Return to VAD Clinic in  2 months with Ramp echo.  Labs drawn today - will call you if abnormal.    Alyce Pagan RN VAD Coordinator  Office: [phone]  24/7 Pager: [phone]

## 2022-10-10 NOTE — Patient Instructions (Signed)
No changes in medications. Rx for Nystatin powder sent to local pharmacy as requested. Return to VAD Clinic in 2 months with Ramp echo.  Labs drawn today - will call you if abnormal.

## 2022-10-13 NOTE — Progress Notes (Addendum)
LVAD Follow-up Clinic Note  PCP: Deeann Saint, MD HF MD: DB   HPI:  Maxfield Olbera is a 67 year old male with history of tobacco use, CAD s/p previous anterior MI, severe systolic HF s/p HM-3 VAD 04/06/21, severe AS s/p AVR, endocarditis.    Admitted 12/20 with anterior ST elevation MI. Cath with chronically occluded RCA (L>>R collaterals) and thrombotic occlusion of proximal LAD treated with PCI. Echo w/ reduced LVEF 30-35% w/ apical aneurysm. RV ok. Post cath, he required milrinone for cardiogenic shock. Subsequently underwent Barostim.   Underwent outpatient [facility] 11/29/20 as part of VAD w/u. Post procedure, developed severe rigors and fever. Admitted to ICU Blood Cx + strep. 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  Likely low-flow low gradient AS. Underwent ICD extraction..    Admitted for cardiogenic shock in 10/22.  He underwent placement of HM3 VAD and aortic valve replacement with 25 mm Edwards Inspiris Resilia valve on 04/06/21. Surgical path came back 11/28 with evidence of possible "acute aortic valve endocarditis". 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 Discharged 05/09/21.   He had a complicated month of December.  On 04/22/2022 he presented with shortness of breath and diaphoresis.  Found to be in ventricular fibrillation.  Was defibrillated.  Then on Christmas day he presented back with chest pain, nausea, and shortness of breath.  ECG showed ST elevation.  Catheterization showed stable CAD. Cardiac CT suspicious for clot on AoV. INR increased to 2.5-3.0 range.   Had PET 1/24 which showed increased size of the spiculated LUL pulmonary nodule which is hypermetabolic concerning for primary bronchogenic carcinoma. Has seen Dr. Dorris Fetch and referred  for stereotactic radiation without biopsy.    Follow up for Heart Failure/LVAD: Here for routine f/u. Continues to go to CR. Feels great. Doing all activities without problem. Denies orthopnea or PND. No fevers, chills or problems with driveline. No bleeding, melena or neuro symptoms. No VAD alarms. Taking all meds as prescribed.     VAD Interrogation: Speed: 5800 Flow: 5.0 Power: 3.7w    PI: 4.8 Alarms: none Events: >100 PI events   Fixed speed: 5800 Low speed limit: 5500   Primary controller: back up battery due for replacement in 15 months Secondary controller:  back up battery due for replacement in 16 months    I reviewed the LVAD parameters from today and compared the results to the patient's prior recorded data. LVAD interrogation was NEGATIVE for significant power changes, NEGATIVE for clinical alarms and STABLE for PI events/speed drops. No programming changes were made and pump is functioning within specified parameters. Pt is performing daily controller and system monitor self tests along with completing weekly and monthly maintenance for LVAD equipment.   LVAD equipment check completed and is in good working order. Back-up equipment  present.    Exit Site Care: Dressing CDI. No redness, tenderness, drainage or odor reported by patient. Will continue weekly dressings.  Provided patient with 8 weekly kits.     Past Medical History:  Diagnosis Date   AICD (automatic cardioverter/defibrillator) present 08/31/2019   Ankylosing spondylitis (HCC)    Arthritis    BENIGN PROSTATIC HYPERTROPHY 06/07/2008   CHF (congestive heart failure) (HCC)    COLONIC POLYPS, HX OF 06/07/2008   Coronary artery disease    Depression  H/O hiatal hernia    Heart failure (HCC)    History of radiation therapy    Left Lung- 08/06/22-08/16/22- Dr. Antony Blackbird   HYPERLIPIDEMIA 06/07/2008   HYPERTENSION 06/07/2008   Myocardial infarction [facility]) 2005   NSTEMI, s/p LAD stent   NEPHROLITHIASIS,  HX OF 06/07/2008   STEMI (ST elevation myocardial infarction) (HCC) 04/27/2019    Current Outpatient Medications  Medication Sig Dispense Refill   acetaminophen (TYLENOL) 500 MG tablet Take 500-1,000 mg by mouth every 6 (six) hours as needed (for pain).     aspirin EC 81 MG tablet Take 1 tablet (81 mg total) by mouth daily. Swallow whole. 90 tablet 1   atorvastatin (LIPITOR) 80 MG tablet PATIENT MUST ATTEND ANNUAL APPOINTMENT FOR FUTURE REFILLS TAKE 1 TABLET BY MOUTH ONCE DAILY AT 6PM. 90 tablet 1   Carboxymethylcellulose Sodium (ARTIFICIAL TEARS OP) Place 1 drop into both eyes daily as needed (for dryness or irritation).     eplerenone (INSPRA) 50 MG tablet Take 1 tablet (50 mg total) by mouth in the morning. 30 tablet 6   ezetimibe (ZETIA) 10 MG tablet TAKE ONE TABLET BY MOUTH DAILY 90 tablet 3   gabapentin (NEURONTIN) 300 MG capsule Take 1 capsule (300 mg total) by mouth 3 (three) times daily. 90 capsule 3   KLOR-CON M20 20 MEQ tablet TAKE 1 TABLET BY MOUTH EVERY DAY 90 tablet 3   losartan (COZAAR) 25 MG tablet Take 0.5 tablets (12.5 mg total) by mouth daily. 30 tablet 5   metoprolol succinate (TOPROL-XL) 25 MG 24 hr tablet TAKE 1/2 TABLET BY MOUTH EVERY DAY 45 tablet 3   nystatin (MYCOSTATIN/NYSTOP) powder Apply 1 Application topically 3 (three) times daily. 15 g 2   pantoprazole (PROTONIX) 40 MG tablet Take 1 tablet (40 mg total) by mouth 2 (two) times daily. 60 tablet 6   sertraline (ZOLOFT) 50 MG tablet TAKE 1/2 TAB BY MOUTH DAILY FOR 1 WEEK, THEN INCREASE TO 1 TABLET DAILY. (Patient taking differently: Take 50 mg by mouth daily. Pt taking 50 mg daily) 90 tablet 3   tamsulosin (FLOMAX) 0.4 MG CAPS capsule TAKE 1 CAPSULE BY MOUTH EVERY DAY 90 capsule 1   traMADol (ULTRAM) 50 MG tablet TAKE 1-2 TABLETS BY MOUTH TWICE A DAY FOR 30 DAYS 120 tablet 1   warfarin (COUMADIN) 2.5 MG tablet Take 7.5 mg (3 tablets) by mouth daily or as instructed by LVAD clinic. (Patient taking differently: Take 2.5  mg by mouth daily at 4 PM. Take 5 mg (2 tablets) on Monday, and 7.5 mg (3 tablets) by mouth daily all other days, or as instructed by LVAD clinic.) 270 tablet 1   ZYRTEC ALLERGY 10 MG tablet Take 10 mg by mouth daily.     ondansetron (ZOFRAN) 4 MG tablet Take 1 tablet (4 mg total) by mouth every 8 (eight) hours as needed for nausea or vomiting. (Patient not taking: Reported on 10/10/2022) 20 tablet 3   torsemide (DEMADEX) 20 MG tablet Take 1 tablet (20 mg total) by mouth as needed. (Patient not taking: Reported on 10/10/2022) 30 tablet 5   No current facility-administered medications for this encounter.    Patient has no known allergies.    Vitals:   10/10/22 1308 10/10/22 1309  BP: (!) 110/0 128/70  Pulse:  75  Temp:  98 F (36.7 C)  SpO2:  97%  Weight:  94.5 kg (208 lb 6.4 oz)  Height:  6' (1.829 m)    Vital Signs:  Temp: 98.0 Doppler Pressure: 98 Automatc BP: 128/70 (91) HR: 890                        SPO2:  97% on RA   Weight: 208.4 w/ ept Last weight: 209.2 lb w/ eqt Ht: 6'    Physical Exam: General:  NAD.  HEENT: normal  Neck: supple. JVP not elevated.  Carotids 2+ bilat; no bruits. No lymphadenopathy or thryomegaly appreciated. Cor: LVAD hum.  Lungs: Clear. Abdomen:  soft, nontender, non-distended. No hepatosplenomegaly. No bruits or masses. Good bowel sounds. Driveline site clean. Anchor in place. Mild irritation around dressing Extremities: no cyanosis, clubbing, rash. Warm no edema  Neuro: alert & oriented x 3. No focal deficits. Moves all 4 without problem    ASSESSMENT AND PLAN:  1. Chronic systolic HFr EF due to iCM - Echo 05/19/20 EF 20-25% RV mildly HK. moderate AS  Mean gradient 13 AVA 1.2 cm2 DI 0.30 - s/p HM-III VAD + bioprosthetic AVR 04/06/21 - Episode of cardiogenic shock and anterior ST elevation 12/23. Cath with patent LAD stent. Cardiac CT suggestive of clot on aortic valve. Felt to be possible embolic event  - Doing great NYHA I  Continue  CR - Volume status ok. Having frequent PI events. Encouraged more fluid intake - Continue Toprol 12.5 daily - Continue eplerenone 50 mg daily   2. HM-3 LVAD implant - VAD interrogated personally. Parameters stable. Continues with frequent PI events.Encouraged fluid intake.  - ASA resumed due to previous thrombotic episode - INR 2.6 Goal 2.5-3.0 due to clotting on AoV and possible coronary embolism 12/23 Discussed dosing with PharmD personally. - LDH 194 - Hgb 12.1 -> 13.0 - MAPs ok - DL site ok. Mild irritation around dressing. Reminded him to change it more frequently when exercising due to sweat/moisture. Will give topical nystatin for possible fungal infection.  3. VT/VF 12/23 - remains in NSR.  - No further VT/VF   4. Severe low-flow aortic stenosis s/p AVR - s/p VAD/bioprosthetic AVR   5. CAD  - s/p anterior STEMI (12/20). LHC showed chronically occluded RCA (with L>>R collaterals) and thrombotic occlusion of proximal LAD. Underwent PCI of LAD. - Now s/p VAD - Recurrent anterior STEMI in 12/23 Cath 12/23 LAD stent patent. Felt to be embolic from aortic valve - Continue atorvastatin 80.  - Continue ASA 81 with previous thrombotic episode off AoV - no s/s angina - Continue CR   6. Paroxysmal Atrial Fibrillation w/ RVR - Remains in NSR - Off amio - on warfarin.    7. 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  - Tissue sent to [facility] in [place] for broad 16 S ribosomal sequencing for bacterial pathogens as well as T wet Bhilai Bartonella Brucella  - Resolved  8. Ankylosing spondylitis - His local rheumatologist is hesitant to start remicade with VAD/infection risk - D/w [name] VAD program they have recommended f/u with Council Mechanic, MD for 2nd opinion if needed.  - Currently feeling ok without it - PCP managing pain meds  9. LUL Bronchogenic CA - h/o tobacco use -  Has seen Dr. Dorris Fetch.  - s/p XRT 5/24  Total time spent 40 minutes. Over half that time spent discussing above.    Arvilla Meres, MD  10:36 PM

## 2022-10-15 ENCOUNTER — Other Ambulatory Visit: Payer: Self-pay

## 2022-10-15 DIAGNOSIS — Z79899 Other long term (current) drug therapy: Secondary | ICD-10-CM

## 2022-10-16 ENCOUNTER — Telehealth: Payer: Self-pay

## 2022-10-16 NOTE — Progress Notes (Signed)
Care Management & Coordination Services Pharmacy Note  10/18/2022 Name:  Brandon [name] MRN:  [id number] DOB:  [birth year]  Summary: -BP at goal <130/80, denies any signs of fluid retention -C/o of having to use restroom frequently during day and night, feels like flomax could be helpng more  Recommendations/Changes made from today's visit: -Counseled on importance of routine BP monitoring and recognizing signs of fluid retention -Counseled on practice of double voiding and limiting fluids 2-3 hours before bedtime -INCREASE flomax to 2 capsules daily, with PCP approval  Follow up plan: -BPH call in 2 weeks General review call in 3 months Pharmacist visit in 6 months   Subjective: Brandon [name] is an 67 y.o. year old male who is a primary patient of Deeann Saint, MD.  The care coordination team was consulted for assistance with disease management and care coordination needs.    Engaged with patient face to face for initial visit.  Recent office visits: 07/26/22 Tillie Rung, LPN - Patient presented for [facility] Annual Wellness Exam. Patient voiced goal for increased physical activity. No medication changes   Recent consult visits: 10/10/22 Bensimhon, Bevelyn Buckles, MD (Cardiology) - Patient presented for Yeast dermatitis and other concerns. Prescribed Nystatin.    10/09/22 Graciella Freer, PA-C (Cardiology) - Patient presented for Chronic Systolic Heart failure and other concerns. No medication changes.   10/03/22 Patient presented for Anti Coag visit reading was 3.1   10/03/22 Cannon Kettle, PA-C (Internal med) - Patient presented for Hyperlipidemia with target LDL less than 70. No medication changes.   09/20/22 Patient presented to [facility] for Intensive Cardiac rehab.   09/16/22 Antony Blackbird, MD (Rad Oncol) - Patient presented to [facility] for Follow up. No medication changes.   08/16/22  Antony Blackbird, MD (Rad Oncol) - Patient presented to  [facility] for SBRT Treatment. No medication changes.   08/05/22 Bensimhon, Bevelyn Buckles, MD (Cardiology) - Patient presented for LVAD and other concerns. No medication changes.   07/23/22 Patient presented to [facility] for CT Simulation   07/15/22 Antony Blackbird - Patient presented to [facility] for Consult. No medication changes.   07/09/22 Loreli Slot, MD (Cardiothoracic Surg) - Patient presented for Lung Nodule. No medication changes.   07/08/22 Elson Clan (Derm) - Patient presented for Seborrheic Keratoses and other concerns. No medication changes.   06/03/22 Bensimhon, Bevelyn Buckles, MD (Cardiology) - Patient presented for Depression and other concerns. Prescribed Sertraline.  Hospital visits: 07/28/22 [facility] - For dental infection, LOS 21 min, start clindamycin   Objective:  Lab Results  Component Value Date   CREATININE 0.97 10/10/2022   BUN 14 10/10/2022   GFR 98.26 04/15/2019   GFRNONAA >60 10/10/2022   GFRAA 103 06/28/2020   NA 137 10/10/2022   K 5.0 10/10/2022   CALCIUM 9.4 10/10/2022   CO2 26 10/10/2022   GLUCOSE 97 10/10/2022    Lab Results  Component Value Date/Time   HGBA1C 6.0 (H) 04/07/2021 07:45 AM   HGBA1C 6.4 (H) 11/30/2020 08:33 AM   GFR 98.26 04/15/2019 08:50 AM   GFR 104.76 04/13/2018 08:35 AM    Last diabetic Eye exam:  Lab Results  Component Value Date/Time   HMDIABEYEEXA Retinopathy (A) 07/18/2016 12:00 AM    Last diabetic Foot exam: No results found for: "HMDIABFOOTEX"   Lab Results  Component Value Date   CHOL 121 10/08/2022   HDL 46 10/08/2022  LDLCALC 59 10/08/2022   TRIG 83 10/08/2022   CHOLHDL 2.6 10/08/2022       Latest Ref Rng & Units 10/10/2022    2:08 PM 06/03/2022   10:25 AM 05/06/2022    7:02 PM  Hepatic Function  Total Protein 6.5 - 8.1 g/dL 7.7  7.0  7.0   Albumin 3.5 - 5.0 g/dL 3.9  3.5  3.6   AST 15 - 41 U/L 26  21  35   ALT 0 - 44 U/L 19  18   20    Alk Phosphatase 38 - 126 U/L 130  121  123   Total Bilirubin 0.3 - 1.2 mg/dL 0.7  0.2  1.7     Lab Results  Component Value Date/Time   TSH 0.917 02/06/2021 09:32 AM   TSH 0.889 04/27/2019 08:36 PM   TSH 0.76 12/06/2016 07:54 AM   TSH 0.86 12/04/2015 08:05 AM   FREET4 1.12 02/06/2021 10:02 AM       Latest Ref Rng & Units 10/10/2022    2:08 PM 08/05/2022    8:48 AM 07/15/2022    1:59 PM  CBC  WBC 4.0 - 10.5 K/uL 7.2  8.0  7.6   Hemoglobin 13.0 - 17.0 g/dL 96.2  95.2  84.1   Hematocrit 39.0 - 52.0 % 42.7  38.0  40.3   Platelets 150 - 400 K/uL 198  235  198     Lab Results  Component Value Date/Time   VD25OH 32.80 03/29/2021 06:00 AM   VITAMINB12 280 03/29/2021 06:00 AM    Clinical ASCVD: Yes  The ASCVD Risk score (Arnett DK, et al., 2019) failed to calculate for the following reasons:   The patient has a prior MI or stroke diagnosis       09/18/2022    4:03 PM 07/26/2022    9:28 AM 07/15/2022   12:39 PM  Depression screen PHQ 2/9  Decreased Interest 0 0 0  Down, Depressed, Hopeless 0 0 0  PHQ - 2 Score 0 0 0  Altered sleeping 0    Tired, decreased energy 1    Change in appetite 0    Feeling bad or failure about yourself  0    Trouble concentrating 0    Moving slowly or fidgety/restless 0    Suicidal thoughts 0    PHQ-9 Score 1    Difficult doing work/chores Not difficult at all       Social History   Tobacco Use  Smoking Status Former   Packs/day: 1.00   Years: 40.00   Additional pack years: 0.00   Total pack years: 40.00   Types: Cigarettes   Quit date: 07/26/2019   Years since quitting: 3.2  Smokeless Tobacco Never  Tobacco Comments   1-1.5 pks per year x 40-45 yrs   BP Readings from Last 3 Encounters:  10/10/22 128/70  10/09/22 (!) 94/0  09/16/22 94/83   Pulse Readings from Last 3 Encounters:  10/10/22 75  10/09/22 62  09/16/22 72   Wt Readings from Last 3 Encounters:  10/10/22 208 lb 6.4 oz (94.5 kg)  10/09/22 207 lb (93.9 kg)  09/16/22  208 lb 3.2 oz (94.4 kg)   BMI Readings from Last 3 Encounters:  10/10/22 28.26 kg/m  10/09/22 28.07 kg/m  09/16/22 28.24 kg/m    No Known Allergies  Medications Reviewed Today     Reviewed by Sherrill Raring, RPH (Pharmacist) on 10/18/22 at 1200  Med List Status: <None>   Medication  Order Taking? Sig Documenting Provider Last Dose Status Informant  acetaminophen (TYLENOL) 500 MG tablet [id number] Yes Take 500-1,000 mg by mouth every 6 (six) hours as needed (for pain). [provider] Taking Active Self  aspirin EC 81 MG tablet [id number] Yes Take 1 tablet (81 mg total) by mouth daily. Swallow whole. Laurann Montana, PA-C Taking Active   atorvastatin (LIPITOR) 80 MG tablet [id number] Yes PATIENT MUST ATTEND ANNUAL APPOINTMENT FOR FUTURE REFILLS TAKE 1 TABLET BY MOUTH ONCE DAILY AT Salomon Mast, MD Taking Active   Carboxymethylcellulose Sodium (ARTIFICIAL TEARS OP) [id number] Yes Place 1 drop into both eyes daily as needed (for dryness or irritation). [provider] Taking Active Self  eplerenone (INSPRA) 50 MG tablet [id number] Yes Take 1 tablet (50 mg total) by mouth in the morning. Bensimhon, Bevelyn Buckles, MD Taking Active Self  ezetimibe (ZETIA) 10 MG tablet [id number] Yes TAKE ONE TABLET BY MOUTH DAILY Bensimhon, Bevelyn Buckles, MD Taking Active   gabapentin (NEURONTIN) 300 MG capsule [id number] Yes Take 1 capsule (300 mg total) by mouth 3 (three) times daily. Bensimhon, Bevelyn Buckles, MD Taking Active   KLOR-CON M20 20 MEQ tablet [id number] Yes TAKE 1 TABLET BY MOUTH EVERY DAY Laurey Morale, MD Taking Active   losartan (COZAAR) 25 MG tablet [id number] Yes Take 0.5 tablets (12.5 mg total) by mouth daily. Robbie Lis M, PA-C Taking Active Self  metoprolol succinate (TOPROL-XL) 25 MG 24 hr tablet [id number] Yes TAKE 1/2 TABLET BY MOUTH EVERY DAY Cannon Kettle, PA-C Taking Active   nystatin (MYCOSTATIN/NYSTOP) powder [id number] Yes Apply 1 Application topically 3  (three) times daily. Bensimhon, Bevelyn Buckles, MD Taking Active   pantoprazole (PROTONIX) 40 MG tablet [id number] Yes Take 1 tablet (40 mg total) by mouth 2 (two) times daily. Bensimhon, Bevelyn Buckles, MD Taking Active   sertraline (ZOLOFT) 50 MG tablet [id number] Yes TAKE 1/2 TAB BY MOUTH DAILY FOR 1 WEEK, THEN INCREASE TO 1 TABLET DAILY.  Patient taking differently: Take 50 mg by mouth daily. Pt taking 50 mg daily   Bensimhon, Bevelyn Buckles, MD Taking Active   tamsulosin (FLOMAX) 0.4 MG CAPS capsule [id number] Yes TAKE 1 CAPSULE BY MOUTH EVERY DAY Deeann Saint, MD Taking Active   torsemide (DEMADEX) 20 MG tablet [id number] Yes Take 1 tablet (20 mg total) by mouth as needed. Bensimhon, Bevelyn Buckles, MD Taking Active Self           Med Note Sherrill Raring   Fri Oct 18, 2022 11:59 AM) Taking prn as instructed, last needed dose 08/13/22  traMADol (ULTRAM) 50 MG tablet [id number] Yes TAKE 1-2 TABLETS BY MOUTH TWICE A DAY FOR 30 DAYS Deeann Saint, MD Taking Active Self  warfarin (COUMADIN) 2.5 MG tablet [id number] Yes Take 7.5 mg (3 tablets) by mouth daily or as instructed by LVAD clinic.  Patient taking differently: Take 2.5 mg by mouth daily at 4 PM. Take 5 mg (2 tablets) on Monday, and 7.5 mg (3 tablets) by mouth daily all other days, or as instructed by LVAD clinic.   Laurann Montana, PA-C Taking Active   ZYRTEC ALLERGY 10 MG tablet [id number] Yes Take 10 mg by mouth daily. [provider] Taking Active Self            SDOH:  (Social Determinants of Health) assessments and interventions performed: Yes SDOH Interventions    Flowsheet Row Care Coordination from 10/18/2022 in [facility] Clinical Support from 07/26/2022 in [facility]  HealthCare at [employer] from 07/15/2022 in [facility] Radiation Oncology Telephone from 05/14/2022 in [facility] Care Coordination CARDIAC REHAB PHASE II ORIENTATION from 07/26/2021 in [facility] Clinical Support from 07/19/2021 in [facility] [place]  SDOH Interventions        Food Insecurity Interventions Intervention Not Indicated Intervention Not Indicated Intervention Not Indicated Intervention Not Indicated -- Intervention Not Indicated  Housing Interventions Intervention Not Indicated Intervention Not Indicated Intervention Not Indicated -- -- Intervention Not Indicated  Transportation Interventions -- Intervention Not Indicated Intervention Not Indicated Intervention Not Indicated -- Intervention Not Indicated  Utilities Interventions -- Intervention Not Indicated Intervention Not Indicated -- -- --  Alcohol Usage Interventions -- Intervention Not Indicated (Score <7) -- -- -- --  Depression Interventions/Treatment  -- -- -- -- Medication --  Financial Strain Interventions -- Intervention Not Indicated -- -- -- Intervention Not Indicated  Physical Activity Interventions -- -- -- -- -- Intervention Not Indicated  Stress Interventions -- Intervention Not Indicated -- -- -- Intervention Not Indicated  Social Connections Interventions -- Intervention Not Indicated -- -- -- Intervention Not Indicated       Medication Assistance: None required.  Patient affirms current coverage meets needs.  Medication Access: Name and location of current pharmacy:  [id number] [place], [place] - [address] [place] [address] Phone: [phone] Fax: [phone]  [id number] - [place], [place] - [address] [place] [address] Phone: [phone] Fax: [phone]  [id number] [place], [place] - [address] [place] [address] Phone: [phone] Fax: [phone]  Within the past 30 days, how often has patient missed a dose of medication? None Is a pillbox or other method used to improve adherence? Yes  Factors that may  affect medication adherence? no barriers identified Are meds synced by current pharmacy? No  Are meds delivered by current pharmacy? No  Does patient experience delays in picking up medications due to transportation concerns? No   Compliance/Adherence/Medication fill history: Care Gaps: Tdap vaccine - last 09/12/2011 COVID Booster - last 01/11/22  Star-Rating Drugs: Atorvastatin 80mg  PDC 97% Losartan 25mg  PDC 100%   Assessment/Plan Heart Failure (Goal: manage symptoms and prevent exacerbations) -Controlled -Last ejection fraction: <20% (Date: 05/07/22) -HF type: HFrEF (EF < 40%) -NYHA Class: III (marked limitation of activity) -AHA HF Stage: C (Heart disease and symptoms present) -Current treatment: Eplerenone 50mg  1 qam Klor-Con M20 1 qd Metoprolol XL 25mg  1 qd Torsemide 20mg  1 qd prn Losartan 25mg  1/2 tab qd -Medications previously tried: Atenolol, Carvedilol, Digoxin, Lasix, Carlanor, Micardis, Olmesartan, Entresto, Spironolactone -Current home BP/HR readings: Not checking at home but gets checked on regular basis with LVAD monitoring -Current home daily weights: states cardio gave him approval to check 2-3x/week and monitor for signs of swelling, stays in the range of 208-212 on weekly basis -Current dietary habits: mindful of salt intake -Current exercise habits: goes to gym 5x/day -Educated on Benefits of medications for managing symptoms and prolonging life Proper diuretic administration and potassium supplementation Importance of blood pressure control -Recommended to continue current medication  Query Hyperlipidemia: (LDL goal < 70) -Not assessed today -Current treatment: Atorvastatin 80mg  1 qd Appropriate, Effective, Safe, Accessible Zetia 10mg  1 qd Appropriate, Effective, Safe, Accessible  CAD/V-fib/LVAD(Goal: Slow progression of atherosclerosis (plaques / blockages) throughout your body to reduce risk of heart attack  and strokes) -Not assessed today Current  Medication Therapy: Aspirin 81mg  1 qd Appropriate, Effective, Safe, Accessible Warfarin 2.5mg  1qd or as directed by LVAD clinic Appropriate, Effective, Safe, Accessible  Query GERD/GI Bleed protection -Current treatment  Pantoprazole 40mg  1 BID Appropriate, Effective, Safe, Accessible  Query Anxiety/Depression  -Current treatment  Sertraline 50mg  1 qd Appropriate, Effective, Safe, Accessible  BPH (Goal: Reduction in urinary frequency and urgecy) -Not ideally controlled -Current treatment  Tamsulosin 0.4mg  1 qd Appropriate, Query Effective -Patient is still having to get up to use restroom at least two times in the evening and frequently during the day (even while not taking the torsemide) -Feels tamsulosin may be helping some but not sure -Counseled on double voiding and restricting fluids before bedtime -Recommend increasing to flomax 2 capsules daily to see if this helps, with PCP approval  Ankylosing spondylitis (Goal: Pain relieve that still allows for ADLs) -Not assessed today -Current treatment  Acetaminophen 500mg  prn Appropriate, Effective, Safe, Accessible Gabapentin 300mg  1 TID Appropriate, Effective, Safe, Accessible Tramadol 50mg  BID prn Appropriate, Effective, Safe, Accessible   OTC  -Current treatment  Aritificial Tears Appropriate, Effective, Safe, Accessible Cetirizine 10mg  1 qd Appropriate, Effective, Safe, Accessible   Sherrill Raring Clinical Pharmacist [phone]

## 2022-10-16 NOTE — Progress Notes (Signed)
Patient ID: Brandon [name], male   DOB: 1955/11/27, 67 y.o.   MRN: [id number]   Care Management & Coordination Services Pharmacy Team  Reason for Encounter: Chart Prep for initial encounter with [name] on 10/18/22 at 11 am in office.  Spoke with patient on 10/16/2022   Have you seen any other providers since your last visit? Patient reports he has seen Dr Judith Blonder for rheumatology.  Any changes in your medications or health? Patient reports none  Any side effects from any medications? Patient reports he notices some teeth grinding at night with the Zoloft and is wondering if he can be weaned off of this medication. He further reports that he would like to have a refill on his tramadol that he has used in the past for his arthritis.  Do you have an symptoms or problems not managed by your medications? Patient reports none  Any concerns about your health right now? Patient reports none   Do you get any type of exercise on a regular basis? Patient reports he is working out at [employer] 5 days a week.  Can you think of a goal you would like to reach for your health? Patient reports not at this time.  Do you have any problems getting your medications? Patient denies any barriers to obtaining medications.    Chart review:  Recent office visits:  07/26/22 Tillie Rung, LPN - Patient presented for [facility] Annual Wellness Exam. Patient voiced goal for increased physical activity. No medication changes.  Recent consult visits:  10/10/22 Bensimhon, Bevelyn Buckles, MD (Cardiology) - Patient presented for Yeast dermatitis and other concerns. Prescribed Nystatin.   10/09/22 Graciella Freer, PA-C (Cardiology) - Patient presented for Chronic Systolic Heart failure and other concerns. No medication changes.  10/03/22 Patient presented for Anti Coag visit reading was 3.1  10/03/22 Cannon Kettle, PA-C (Internal med) - Patient presented for Hyperlipidemia with target LDL less than 70. No  medication changes.  09/20/22 Patient presented to [facility] for Intensive Cardiac rehab.  09/16/22 Antony Blackbird, MD (Rad Oncol) - Patient presented to [facility] for Follow up. No medication changes.  08/16/22  Antony Blackbird, MD (Rad Oncol) - Patient presented to [facility] for SBRT Treatment. No medication changes.  08/05/22 Bensimhon, Bevelyn Buckles, MD (Cardiology) - Patient presented for LVAD and other concerns. No medication changes.  07/23/22 Patient presented to [facility] for CT Simulation  07/15/22 Antony Blackbird - Patient presented to [facility] for Consult. No medication changes.  07/09/22 Loreli Slot, MD (Cardiothoracic Surg) - Patient presented for Lung Nodule. No medication changes.  07/08/22 Elson Clan (Derm) - Patient presented for Seborrheic Keratoses and other concerns. No medication changes.  06/03/22 Bensimhon, Bevelyn Buckles, MD (Cardiology) - Patient presented for Depression and other concerns. Prescribed Sertraline.   Hospital visits:  Medication Reconciliation was completed by comparing discharge summary, patient's EMR and Pharmacy list, and upon discussion with patient.  Patient presented to [facility] due to Dental infection. Patient was present for 21 min.  New?Medications Started at [facility] Discharge:?? -started  Clindamycin  Medication Changes at Hospital Discharge: -Changed  none  Medications Discontinued at Hospital Discharge: -Stopped  none  Medications that remain the same after Hospital Discharge:??  -All other medications will remain the same.    Patient presented to [facility] on 04/22/22 due to Ventricular Fibrillation. Patient was present for 21 hours  New?Medications Started at [facility] Discharge:?? -started  none  Medication Changes at Hospital Discharge: -Changed  Pantoprazole  Medications Discontinued at Hospital  Discharge: -Stopped  amoxicillin  Medications that remain the same after Hospital Discharge:??  -All other medications will remain the same.    Patient presented to [facility] due to Dental infection. Patient was present for 21 min.  New?Medications Started at [facility] Discharge:?? -started  Clindamycin  Medication Changes at Hospital Discharge: -Changed  none  Medications Discontinued at Hospital Discharge: -Stopped  none  Medications that remain the same after Hospital Discharge:??  -All other medications will remain the same.    Fill History : ELIQUIS 5 MG TABLET 02/10/2021 30   ATORVASTATIN 80 MG TABLET 10/02/2022 90   EZETIMIBE 10MG  TAB 08/08/2021 90   10/08/2022 30   gabapentin (NEURONTIN) 300 MG capsule 10/08/2022 30   ONDANSETRON HCL 4 MG TABLET 05/18/2021 6   losartan (COZAAR) 25 MG tablet 09/20/2022 60   METOPROLOL SUCC ER 25 MG TAB 10/09/2022 90   NYSTATIN 100,000 UNIT/GM POWD 10/10/2022 8   PANTOPRAZOLE SOD DR 40 MG TAB 08/22/2022 90   KLOR-CON M20 TABLET 09/16/2022 90   PREDNISOLONE AC 1% EYE DROP 10/07/2022 1   SERTRALINE HCL 50 MG TABLET 09/07/2022 90   TAMSULOSIN HCL 0.4 MG CAPSULE 09/16/2022 90   TORSEMIDE 20 MG TABLET 07/27/2022 90   WARFARIN SODIUM 2.5 MG TABLET 09/28/2022 90   TRAMADOL HCL 50 MG TABLET 05/08/2022 30   Star Rating Drugs:  Atorvastatin 80 mg - [facility] Losartan 25 mg - Last filled 09/20/22 60 DS at [facility] Gaps: TDAP - Overdue COVID Booster - Overdue AWV - 09/25/22   Pamala Duffel CMA Clinical Pharmacist Assistant [phone]

## 2022-10-17 ENCOUNTER — Ambulatory Visit (HOSPITAL_COMMUNITY): Payer: Self-pay | Admitting: Pharmacist

## 2022-10-17 LAB — POCT INR: INR: 2.1 (ref 2.0–3.0)

## 2022-10-18 ENCOUNTER — Ambulatory Visit: Payer: Medicare Other

## 2022-10-24 ENCOUNTER — Ambulatory Visit (HOSPITAL_COMMUNITY): Payer: Self-pay | Admitting: Pharmacist

## 2022-10-24 LAB — POCT INR: INR: 2.6 (ref 2.0–3.0)

## 2022-10-25 ENCOUNTER — Other Ambulatory Visit: Payer: Self-pay | Admitting: Family Medicine

## 2022-10-25 DIAGNOSIS — M456 Ankylosing spondylitis lumbar region: Secondary | ICD-10-CM

## 2022-10-25 DIAGNOSIS — N400 Enlarged prostate without lower urinary tract symptoms: Secondary | ICD-10-CM

## 2022-10-25 MED ORDER — TRAMADOL HCL 50 MG PO TABS
ORAL_TABLET | ORAL | 1 refills | Status: DC
Start: 2022-10-25 — End: 2023-05-19

## 2022-10-25 MED ORDER — TAMSULOSIN HCL 0.4 MG PO CAPS: 0.8000 mg | ORAL_CAPSULE | Freq: Every day | ORAL | 1 refills | Status: DC

## 2022-10-31 ENCOUNTER — Ambulatory Visit (HOSPITAL_COMMUNITY): Payer: Self-pay | Admitting: Pharmacist

## 2022-10-31 LAB — POCT INR: INR: 2.8 (ref 2.0–3.0)

## 2022-11-02 ENCOUNTER — Other Ambulatory Visit (HOSPITAL_COMMUNITY): Payer: Self-pay

## 2022-11-07 ENCOUNTER — Ambulatory Visit (HOSPITAL_COMMUNITY): Payer: Self-pay | Admitting: Pharmacist

## 2022-11-07 LAB — POCT INR: INR: 2.5 (ref 2.0–3.0)

## 2022-11-13 ENCOUNTER — Ambulatory Visit (HOSPITAL_COMMUNITY): Payer: Self-pay | Admitting: Pharmacist

## 2022-11-13 LAB — POCT INR: INR: 2.6 (ref 2.0–3.0)

## 2022-11-15 ENCOUNTER — Other Ambulatory Visit: Payer: Self-pay

## 2022-11-21 ENCOUNTER — Ambulatory Visit (HOSPITAL_COMMUNITY): Payer: Self-pay | Admitting: Pharmacist

## 2022-11-21 LAB — POCT INR: INR: 2.7 (ref 2.0–3.0)

## 2022-11-27 ENCOUNTER — Encounter (INDEPENDENT_AMBULATORY_CARE_PROVIDER_SITE_OTHER): Payer: Medicare Other | Admitting: Ophthalmology

## 2022-11-27 DIAGNOSIS — I10 Essential (primary) hypertension: Secondary | ICD-10-CM

## 2022-11-27 DIAGNOSIS — H35712 Central serous chorioretinopathy, left eye: Secondary | ICD-10-CM

## 2022-11-27 DIAGNOSIS — H353132 Nonexudative age-related macular degeneration, bilateral, intermediate dry stage: Secondary | ICD-10-CM

## 2022-11-27 DIAGNOSIS — H35033 Hypertensive retinopathy, bilateral: Secondary | ICD-10-CM | POA: Diagnosis not present

## 2022-11-27 DIAGNOSIS — H43813 Vitreous degeneration, bilateral: Secondary | ICD-10-CM

## 2022-11-28 ENCOUNTER — Ambulatory Visit (HOSPITAL_COMMUNITY): Payer: Self-pay | Admitting: Pharmacist

## 2022-11-28 LAB — POCT INR: INR: 2.8 (ref 2.0–3.0)

## 2022-12-02 ENCOUNTER — Other Ambulatory Visit: Payer: Self-pay

## 2022-12-05 ENCOUNTER — Ambulatory Visit (HOSPITAL_COMMUNITY): Payer: Self-pay

## 2022-12-05 LAB — POCT INR: INR: 2.5 (ref 2.0–3.0)

## 2022-12-05 NOTE — Progress Notes (Signed)
LVAD INR 

## 2022-12-12 ENCOUNTER — Ambulatory Visit (HOSPITAL_COMMUNITY): Payer: Self-pay | Admitting: Pharmacist

## 2022-12-12 LAB — POCT INR: INR: 2.6 (ref 2.0–3.0)

## 2022-12-15 ENCOUNTER — Encounter (HOSPITAL_COMMUNITY): Payer: Self-pay

## 2022-12-15 NOTE — Progress Notes (Signed)
Pt paged VAD Coordinator stating he was having Medical illustrator. Decision made to meet pt to change back up battery per pt's request. Back up battery replaced with UE:[id number] Exp: 06/07/2024 Man: 07/08/2022. Back up battery expires in 30 Months.  Pt also needed dressing change due to caregiver being sick. Pt instructed to call VAD Clinic at the end of week if caregiver remains sick to schedule appointment for dressing change next week.  Drive line exit site: Existing VAD dressing removed and site care performed using sterile technique. Drive line exit site cleaned with Chlora prep applicators x 2, allowed to dry, and Sorbaview dressing with Silverlon patch applied. Exit site healed and incorporated, the velour is fully implanted at exit site. No redness, tenderness, drainage, foul odor or rash noted. Drive line anchor re-applied. Pt denies fever or chills.    Simmie Davies RN, BSN VAD Coordinator 24/7 Pager [phone]

## 2022-12-16 ENCOUNTER — Encounter (HOSPITAL_COMMUNITY): Payer: Self-pay

## 2022-12-16 ENCOUNTER — Encounter (HOSPITAL_COMMUNITY): Payer: Medicare Other

## 2022-12-17 ENCOUNTER — Ambulatory Visit (HOSPITAL_COMMUNITY)
Admission: RE | Admit: 2022-12-17 | Discharge: 2022-12-17 | Disposition: A | Discharge status: Home / Self Care | Payer: Medicare Other | Source: Ambulatory Visit | Attending: Radiation Oncology | Admitting: Radiation Oncology

## 2022-12-17 DIAGNOSIS — R911 Solitary pulmonary nodule: Secondary | ICD-10-CM | POA: Diagnosis present

## 2022-12-19 ENCOUNTER — Ambulatory Visit (HOSPITAL_COMMUNITY): Payer: Self-pay | Admitting: Pharmacist

## 2022-12-19 ENCOUNTER — Ambulatory Visit
Admission: RE | Admit: 2022-12-19 | Discharge: 2022-12-19 | Disposition: A | Discharge status: Home / Self Care | Payer: Medicare Other | Source: Ambulatory Visit | Attending: Radiation Oncology | Admitting: Radiation Oncology

## 2022-12-19 ENCOUNTER — Telehealth: Payer: Self-pay | Admitting: *Deleted

## 2022-12-19 LAB — POCT INR: INR: 2.2 (ref 2.0–3.0)

## 2022-12-19 NOTE — Telephone Encounter (Signed)
Called patient to ask about coming in for fu appt. on 12-23-22 instead of today due to scan not being read and the weather, spoke with patient and he agreed to come for fu appt. on 12-23-22 @ 4 pm

## 2022-12-19 NOTE — Progress Notes (Signed)
LVAD INR 

## 2022-12-22 NOTE — Progress Notes (Signed)
Radiation Oncology         [phone] ________________________________  Name: Brandon [name] MRN: [id number]  Date: 12/23/2022  DOB: [birth year]  Follow-Up Visit Note  CC: Deeann Saint, MD  Charlett Lango C, *  No diagnosis found.  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 A diagnosis of Lung nodule was also pertinent to this visit.   PET avid left upper lobe pulmonary nodule      Interval Since Last Radiation: 4 months and 7 days   Indication for treatment:  Curative       Radiation treatment dates: from 08/06/22 through 08/16/22  Site/dose: Left lung - treated in total with 50 Gy delivered in 5 Fx at 10 Gy/Fx Beams/energy: 6X-FFF  Narrative:  The patient returns today for routine follow-up and to review recent imaging. He was last seen here for follow-up on 09/16/22. Since his last visit,  the patient graduated from intensive cardiac rehab on 09/20/22.  He continues to follow with cardiology for management of his various cardiovascular issues.                           His most recent chest CT without contrast on 12/17/22 demonstrates ***.      Allergies:  has No Known Allergies.  Meds: Current Outpatient Medications  Medication Sig Dispense Refill   acetaminophen (TYLENOL) 500 MG tablet Take 500-1,000 mg by mouth every 6 (six) hours as needed (for pain).     aspirin EC 81 MG tablet Take 1 tablet (81 mg total) by mouth daily. Swallow whole. 90 tablet 1   atorvastatin (LIPITOR) 80 MG tablet PATIENT MUST ATTEND ANNUAL APPOINTMENT FOR FUTURE REFILLS TAKE 1 TABLET BY MOUTH ONCE DAILY AT 6PM. 90 tablet 1   Carboxymethylcellulose Sodium (ARTIFICIAL TEARS OP) Place 1 drop into both eyes daily as needed (for dryness or irritation).     eplerenone (INSPRA) 50 MG tablet Take 1 tablet (50 mg total) by mouth in the morning. 30 tablet 6   ezetimibe (ZETIA) 10 MG tablet TAKE ONE TABLET BY MOUTH DAILY 90 tablet 3    gabapentin (NEURONTIN) 300 MG capsule Take 1 capsule (300 mg total) by mouth 3 (three) times daily. 90 capsule 3   KLOR-CON M20 20 MEQ tablet TAKE 1 TABLET BY MOUTH EVERY DAY 90 tablet 3   losartan (COZAAR) 25 MG tablet Take 0.5 tablets (12.5 mg total) by mouth daily. 30 tablet 5   metoprolol succinate (TOPROL-XL) 25 MG 24 hr tablet TAKE 1/2 TABLET BY MOUTH EVERY DAY 45 tablet 3   nystatin (MYCOSTATIN/NYSTOP) powder Apply 1 Application topically 3 (three) times daily. 15 g 2   pantoprazole (PROTONIX) 40 MG tablet Take 1 tablet (40 mg total) by mouth 2 (two) times daily. 60 tablet 6   sertraline (ZOLOFT) 50 MG tablet TAKE 1/2 TAB BY MOUTH DAILY FOR 1 WEEK, THEN INCREASE TO 1 TABLET DAILY. (Patient taking differently: Take 50 mg by mouth daily. Pt taking 50 mg daily) 90 tablet 3   tamsulosin (FLOMAX) 0.4 MG CAPS capsule Take 2 capsules (0.8 mg total) by mouth daily. 180 capsule 1   torsemide (DEMADEX) 20 MG tablet Take 1 tablet (20 mg total) by mouth as needed. 30 tablet 5   traMADol (ULTRAM) 50 MG tablet TAKE 1-2 TABLETS BY MOUTH TWICE A DAY FOR 30 DAYS 120 tablet 1   warfarin (COUMADIN) 2.5 MG tablet Take 7.5  mg (3 tablets) by mouth daily or as instructed by LVAD clinic. (Patient taking differently: Take 2.5 mg by mouth daily at 4 PM. Take 5 mg (2 tablets) on Monday, and 7.5 mg (3 tablets) by mouth daily all other days, or as instructed by LVAD clinic.) 270 tablet 1   ZYRTEC ALLERGY 10 MG tablet Take 10 mg by mouth daily.     No current facility-administered medications for this encounter.    Physical Findings: The patient is in no acute distress. Patient is alert and oriented.  vitals were not taken for this visit. .  No significant changes. Lungs are clear to auscultation bilaterally. Heart has regular rate and rhythm. No palpable cervical, supraclavicular, or axillary adenopathy. Abdomen soft, non-tender, normal bowel sounds.   Lab Findings: Lab Results  Component Value Date   WBC 7.2  10/10/2022   HGB 13.0 10/10/2022   HCT 42.7 10/10/2022   MCV 81.0 10/10/2022   PLT 198 10/10/2022    Radiographic Findings: No results found.  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 A diagnosis of Lung nodule was also pertinent to this visit.   PET avid left upper lobe pulmonary nodule      The patient is recovering from the effects of radiation.  ***  Plan:  ***   *** minutes of total time was spent for this patient encounter, including preparation, face-to-face counseling with the patient and coordination of care, physical exam, and documentation of the encounter. ____________________________________  Billie Lade, PhD, MD  This document serves as a record of services personally performed by Antony Blackbird, MD. It was created on his behalf by Neena Rhymes, a trained medical scribe. The creation of this record is based on the scribe's personal observations and the provider's statements to them. This document has been checked and approved by the attending provider.

## 2022-12-23 ENCOUNTER — Ambulatory Visit
Admission: RE | Admit: 2022-12-23 | Discharge: 2022-12-23 | Disposition: A | Discharge status: Home / Self Care | Payer: Medicare Other | Source: Ambulatory Visit | Attending: Radiation Oncology | Admitting: Radiation Oncology

## 2022-12-23 ENCOUNTER — Encounter: Payer: Self-pay | Admitting: Radiation Oncology

## 2022-12-23 ENCOUNTER — Telehealth: Payer: Self-pay | Admitting: *Deleted

## 2022-12-23 ENCOUNTER — Other Ambulatory Visit: Payer: Self-pay

## 2022-12-23 VITALS — BP 89/73 | HR 74 | Temp 96.9°F | Resp 18 | Ht 72.0 in | Wt 206.5 lb

## 2022-12-23 DIAGNOSIS — I517 Cardiomegaly: Secondary | ICD-10-CM | POA: Diagnosis not present

## 2022-12-23 DIAGNOSIS — Z7982 Long term (current) use of aspirin: Secondary | ICD-10-CM | POA: Diagnosis not present

## 2022-12-23 DIAGNOSIS — K8020 Calculus of gallbladder without cholecystitis without obstruction: Secondary | ICD-10-CM | POA: Diagnosis not present

## 2022-12-23 DIAGNOSIS — J432 Centrilobular emphysema: Secondary | ICD-10-CM | POA: Insufficient documentation

## 2022-12-23 DIAGNOSIS — Z923 Personal history of irradiation: Secondary | ICD-10-CM | POA: Diagnosis not present

## 2022-12-23 DIAGNOSIS — C3412 Malignant neoplasm of upper lobe, left bronchus or lung: Secondary | ICD-10-CM | POA: Diagnosis present

## 2022-12-23 DIAGNOSIS — R911 Solitary pulmonary nodule: Secondary | ICD-10-CM

## 2022-12-23 DIAGNOSIS — I700 Atherosclerosis of aorta: Secondary | ICD-10-CM | POA: Diagnosis not present

## 2022-12-23 DIAGNOSIS — Z79899 Other long term (current) drug therapy: Secondary | ICD-10-CM | POA: Insufficient documentation

## 2022-12-23 DIAGNOSIS — Z7901 Long term (current) use of anticoagulants: Secondary | ICD-10-CM

## 2022-12-23 DIAGNOSIS — I7121 Aneurysm of the ascending aorta, without rupture: Secondary | ICD-10-CM | POA: Diagnosis not present

## 2022-12-23 NOTE — Telephone Encounter (Signed)
CALLED PATIENT TO ASK IF HE WOULD BE INTERESTED IN COMING @ 3 PM TO DAY INSTEAD OF 4 PM TODAY DUE TO A CANCELLATION, SPOKE WITH PATIENT AND HE AGREED TO COME IN @ 3 PM TODAY

## 2022-12-23 NOTE — Progress Notes (Signed)
Brandon [name] is here today for follow up post radiation to the lung.  Lung Side: Left Lung  Does the patient complain of any of the following: Pain:No Shortness of breath w/wo exertion:  Occasionally Cough: Occasionally  Hemoptysis: No Pain with swallowing: NO Swallowing/choking concerns: No Appetite: Good Energy Level: Good Post radiation skin Changes: No     BP (!) 89/73 (BP Location: Left Arm, Patient Position: Sitting)   Pulse 74   Temp (!) 96.9 F (36.1 C) (Temporal)   Resp 18   Ht 6' (1.829 m)   Wt 206 lb 8 oz (93.7 kg)   SpO2 96%   BMI 28.01 kg/m

## 2022-12-25 ENCOUNTER — Other Ambulatory Visit (HOSPITAL_COMMUNITY): Payer: Self-pay

## 2022-12-25 ENCOUNTER — Other Ambulatory Visit (HOSPITAL_COMMUNITY): Payer: Self-pay | Admitting: Internal Medicine

## 2022-12-25 DIAGNOSIS — Z7901 Long term (current) use of anticoagulants: Secondary | ICD-10-CM

## 2022-12-25 DIAGNOSIS — Z95811 Presence of heart assist device: Secondary | ICD-10-CM

## 2022-12-26 ENCOUNTER — Ambulatory Visit (HOSPITAL_COMMUNITY): Payer: Self-pay | Admitting: Pharmacist

## 2022-12-26 LAB — POCT INR: INR: 2.7 (ref 2.0–3.0)

## 2022-12-26 NOTE — Progress Notes (Signed)
Gym 5 days per week

## 2022-12-27 NOTE — Telephone Encounter (Signed)
Entered in error

## 2022-12-29 NOTE — Progress Notes (Signed)
LVAD Follow-up Clinic Note  PCP: Deeann Saint, MD HF MD: DB   HPI:  Brandon [name] is a 67 year old male with history of tobacco use, CAD s/p previous anterior MI, severe systolic HF s/p HM-3 VAD 04/06/21, severe AS s/p AVR, endocarditis.    Admitted 12/20 with anterior ST elevation MI. Cath with chronically occluded RCA (L>>R collaterals) and thrombotic occlusion of proximal LAD treated with PCI. Echo w/ reduced LVEF 30-35% w/ apical aneurysm. RV ok. Post cath, he required milrinone for cardiogenic shock. Subsequently underwent Barostim.   Underwent outpatient [facility] 11/29/20 as part of VAD w/u. Post procedure, developed severe rigors and fever. Admitted to ICU Blood Cx + strep. 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  Likely low-flow low gradient AS. Underwent ICD extraction..    Admitted for cardiogenic shock in 10/22.  He underwent placement of HM3 VAD and aortic valve replacement with 25 mm Edwards Inspiris Resilia valve on 04/06/21. Surgical path came back 11/28 with evidence of possible "acute aortic valve endocarditis". 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 Discharged 05/09/21.   He had a complicated month of December.  On 04/22/2022 he presented with shortness of breath and diaphoresis.  Found to be in ventricular fibrillation.  Was defibrillated.  Then on Christmas day he presented back with chest pain, nausea, and shortness of breath.  ECG showed ST elevation.  Catheterization showed stable CAD. Cardiac CT suspicious for clot on AoV. INR increased to 2.5-3.0 range.   Had PET 1/24 which showed increased size of the spiculated LUL pulmonary nodule which is hypermetabolic concerning for primary bronchogenic carcinoma. Has seen Dr. Dorris Fetch and referred  for stereotactic radiation without biopsy.    Follow up for Heart Failure/LVAD: Here for routine f/u. Continues to go to CR. Feels great. Doing all activities without problem. Denies orthopnea or PND. No fevers, chills or problems with driveline. No bleeding, melena or neuro symptoms. No VAD alarms. Taking all meds as prescribed.     VAD Interrogation: Speed: 5800 Flow: 5.0 Power: 3.7w    PI: 4.8 Alarms: none Events: >100 PI events   Fixed speed: 5800 Low speed limit: 5500   Primary controller: back up battery due for replacement in 15 months Secondary controller:  back up battery due for replacement in 16 months    I reviewed the LVAD parameters from today and compared the results to the patient's prior recorded data. LVAD interrogation was NEGATIVE for significant power changes, NEGATIVE for clinical alarms and STABLE for PI events/speed drops. No programming changes were made and pump is functioning within specified parameters. Pt is performing daily controller and system monitor self tests along with completing weekly and monthly maintenance for LVAD equipment.   LVAD equipment check completed and is in good working order. Back-up equipment  present.    Exit Site Care: Dressing CDI. No redness, tenderness, drainage or odor reported by patient. Will continue weekly dressings.  Provided patient with 8 weekly kits.     Past Medical History:  Diagnosis Date   AICD (automatic cardioverter/defibrillator) present 08/31/2019   Ankylosing spondylitis (HCC)    Arthritis    BENIGN PROSTATIC HYPERTROPHY 06/07/2008   CHF (congestive heart failure) (HCC)    COLONIC POLYPS, HX OF 06/07/2008   Coronary artery disease    Depression  H/O hiatal hernia    Heart failure (HCC)    History of radiation therapy    Left Lung- 08/06/22-08/16/22- Dr. Antony Blackbird   HYPERLIPIDEMIA 06/07/2008   HYPERTENSION 06/07/2008   Myocardial infarction [facility]) 2005   NSTEMI, s/p LAD stent   NEPHROLITHIASIS,  HX OF 06/07/2008   STEMI (ST elevation myocardial infarction) (HCC) 04/27/2019    Current Outpatient Medications  Medication Sig Dispense Refill   acetaminophen (TYLENOL) 500 MG tablet Take 500-1,000 mg by mouth every 6 (six) hours as needed (for pain).     aspirin EC 81 MG tablet Take 1 tablet (81 mg total) by mouth daily. Swallow whole. 90 tablet 1   atorvastatin (LIPITOR) 80 MG tablet PATIENT MUST ATTEND ANNUAL APPOINTMENT FOR FUTURE REFILLS TAKE 1 TABLET BY MOUTH ONCE DAILY AT 6PM. 90 tablet 1   Carboxymethylcellulose Sodium (ARTIFICIAL TEARS OP) Place 1 drop into both eyes daily as needed (for dryness or irritation).     eplerenone (INSPRA) 50 MG tablet Take 1 tablet (50 mg total) by mouth in the morning. 30 tablet 6   ezetimibe (ZETIA) 10 MG tablet TAKE ONE TABLET BY MOUTH DAILY 90 tablet 3   gabapentin (NEURONTIN) 300 MG capsule Take 1 capsule (300 mg total) by mouth 3 (three) times daily. 90 capsule 3   KLOR-CON M20 20 MEQ tablet TAKE 1 TABLET BY MOUTH EVERY DAY 90 tablet 3   losartan (COZAAR) 25 MG tablet Take 0.5 tablets (12.5 mg total) by mouth daily. 30 tablet 5   metoprolol succinate (TOPROL-XL) 25 MG 24 hr tablet TAKE 1/2 TABLET BY MOUTH EVERY DAY 45 tablet 3   nystatin (MYCOSTATIN/NYSTOP) powder Apply 1 Application topically 3 (three) times daily. 15 g 2   pantoprazole (PROTONIX) 40 MG tablet Take 1 tablet (40 mg total) by mouth 2 (two) times daily. 60 tablet 6   sertraline (ZOLOFT) 50 MG tablet TAKE 1/2 TAB BY MOUTH DAILY FOR 1 WEEK, THEN INCREASE TO 1 TABLET DAILY. (Patient taking differently: Take 50 mg by mouth daily. Pt taking 50 mg daily) 90 tablet 3   tamsulosin (FLOMAX) 0.4 MG CAPS capsule Take 2 capsules (0.8 mg total) by mouth daily. 180 capsule 1   torsemide (DEMADEX) 20 MG tablet Take 1 tablet (20 mg total) by mouth as needed. (Patient not taking: Reported on 12/23/2022) 30 tablet 5   traMADol (ULTRAM) 50 MG tablet TAKE 1-2 TABLETS BY MOUTH TWICE A DAY FOR 30 DAYS 120  tablet 1   warfarin (COUMADIN) 2.5 MG tablet TAKE 7.5 MG (3 TABLETS) EVERY MONDAY/FRIDAY AND 5 MG (2 TABLETS) ALL OTHER DAYS OR AS INSTRUCTED BY LVAD CLINIC. 180 tablet 11   ZYRTEC ALLERGY 10 MG tablet Take 10 mg by mouth daily.     No current facility-administered medications for this encounter.    Patient has no known allergies.    There were no vitals filed for this visit.   Vital Signs:     Temp: 98.0 Doppler Pressure: 98 Automatc BP: 128/70 (91) HR: 890                        SPO2:  97% on RA   Weight: 208.4 w/ ept Last weight: 209.2 lb w/ eqt Ht: 6'    Physical Exam: General:  NAD.  HEENT: normal  Neck: supple. JVP not elevated.  Carotids 2+ bilat; no bruits. No lymphadenopathy or thryomegaly appreciated. Cor: LVAD hum.  Lungs: Clear. Abdomen:  soft, nontender, non-distended. No hepatosplenomegaly.  No bruits or masses. Good bowel sounds. Driveline site clean. Anchor in place. Mild irritation around dressing Extremities: no cyanosis, clubbing, rash. Warm no edema  Neuro: alert & oriented x 3. No focal deficits. Moves all 4 without problem    ASSESSMENT AND PLAN:  1. Chronic systolic HFr EF due to iCM - Echo 05/19/20 EF 20-25% RV mildly HK. moderate AS  Mean gradient 13 AVA 1.2 cm2 DI 0.30 - s/p HM-III VAD + bioprosthetic AVR 04/06/21 - Episode of cardiogenic shock and anterior ST elevation 12/23. Cath with patent LAD stent. Cardiac CT suggestive of clot on aortic valve. Felt to be possible embolic event  - Doing great NYHA I  Continue CR - Volume status ok. Having frequent PI events. Encouraged more fluid intake - Continue Toprol 12.5 daily - Continue eplerenone 50 mg daily   2. HM-3 LVAD implant - VAD interrogated personally. Parameters stable. Continues with frequent PI events.Encouraged fluid intake.  - ASA resumed due to previous thrombotic episode - INR 2.6 Goal 2.5-3.0 due to clotting on AoV and possible coronary embolism 12/23 Discussed dosing with PharmD  personally. - LDH 194 - Hgb 12.1 -> 13.0 - MAPs ok - DL site ok. Mild irritation around dressing. Reminded him to change it more frequently when exercising due to sweat/moisture. Will give topical nystatin for possible fungal infection.  3. VT/VF 12/23 - remains in NSR.  - No further VT/VF   4. Severe low-flow aortic stenosis s/p AVR - s/p VAD/bioprosthetic AVR   5. CAD  - s/p anterior STEMI (12/20). LHC showed chronically occluded RCA (with L>>R collaterals) and thrombotic occlusion of proximal LAD. Underwent PCI of LAD. - Now s/p VAD - Recurrent anterior STEMI in 12/23 Cath 12/23 LAD stent patent. Felt to be embolic from aortic valve - Continue atorvastatin 80.  - Continue ASA 81 with previous thrombotic episode off AoV - no s/s angina - Continue CR   6. Paroxysmal Atrial Fibrillation w/ RVR - Remains in NSR - Off amio - on warfarin.    7. 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  - Tissue sent to [facility] in [place] for broad 16 S ribosomal sequencing for bacterial pathogens as well as T wet Bhilai Bartonella Brucella  - Resolved  8. Ankylosing spondylitis - His local rheumatologist is hesitant to start remicade with VAD/infection risk - D/w [name] VAD program they have recommended f/u with Council Mechanic, MD for 2nd opinion if needed.  - Currently feeling ok without it - PCP managing pain meds  9. LUL Bronchogenic CA - h/o tobacco use - Has seen Dr. Dorris Fetch.  - s/p XRT 5/24  Total time spent 40 minutes. Over half that time spent discussing above.    Arvilla Meres, MD  6:19 PM

## 2022-12-30 ENCOUNTER — Other Ambulatory Visit: Payer: Self-pay

## 2022-12-30 ENCOUNTER — Ambulatory Visit (HOSPITAL_BASED_OUTPATIENT_CLINIC_OR_DEPARTMENT_OTHER)
Admission: RE | Admit: 2022-12-30 | Discharge: 2022-12-30 | Disposition: A | Discharge status: Home / Self Care | Payer: Medicare Other | Source: Ambulatory Visit | Attending: Internal Medicine | Admitting: Internal Medicine

## 2022-12-30 ENCOUNTER — Ambulatory Visit (HOSPITAL_COMMUNITY): Payer: Self-pay | Admitting: Pharmacist

## 2022-12-30 ENCOUNTER — Other Ambulatory Visit (HOSPITAL_COMMUNITY): Payer: Self-pay | Admitting: Internal Medicine

## 2022-12-30 ENCOUNTER — Other Ambulatory Visit (HOSPITAL_COMMUNITY): Payer: Self-pay

## 2022-12-30 ENCOUNTER — Ambulatory Visit (HOSPITAL_COMMUNITY)
Admission: RE | Admit: 2022-12-30 | Discharge: 2022-12-30 | Disposition: A | Discharge status: Home / Self Care | Payer: Medicare Other | Source: Ambulatory Visit | Attending: Internal Medicine | Admitting: Internal Medicine

## 2022-12-30 ENCOUNTER — Ambulatory Visit (HOSPITAL_COMMUNITY): Payer: Medicare Other

## 2022-12-30 VITALS — BP 115/103 | HR 80 | Ht 72.0 in | Wt 208.5 lb

## 2022-12-30 DIAGNOSIS — I7121 Aneurysm of the ascending aorta, without rupture: Secondary | ICD-10-CM | POA: Insufficient documentation

## 2022-12-30 DIAGNOSIS — Z95811 Presence of heart assist device: Secondary | ICD-10-CM | POA: Diagnosis not present

## 2022-12-30 DIAGNOSIS — Z955 Presence of coronary angioplasty implant and graft: Secondary | ICD-10-CM | POA: Diagnosis not present

## 2022-12-30 DIAGNOSIS — Z7901 Long term (current) use of anticoagulants: Secondary | ICD-10-CM | POA: Diagnosis not present

## 2022-12-30 DIAGNOSIS — I2510 Atherosclerotic heart disease of native coronary artery without angina pectoris: Secondary | ICD-10-CM | POA: Diagnosis not present

## 2022-12-30 DIAGNOSIS — Z86718 Personal history of other venous thrombosis and embolism: Secondary | ICD-10-CM | POA: Insufficient documentation

## 2022-12-30 DIAGNOSIS — I480 Paroxysmal atrial fibrillation: Secondary | ICD-10-CM | POA: Insufficient documentation

## 2022-12-30 DIAGNOSIS — I252 Old myocardial infarction: Secondary | ICD-10-CM | POA: Insufficient documentation

## 2022-12-30 DIAGNOSIS — Z79899 Other long term (current) drug therapy: Secondary | ICD-10-CM | POA: Diagnosis not present

## 2022-12-30 DIAGNOSIS — M459 Ankylosing spondylitis of unspecified sites in spine: Secondary | ICD-10-CM | POA: Insufficient documentation

## 2022-12-30 DIAGNOSIS — B372 Candidiasis of skin and nail: Secondary | ICD-10-CM

## 2022-12-30 DIAGNOSIS — E785 Hyperlipidemia, unspecified: Secondary | ICD-10-CM | POA: Diagnosis not present

## 2022-12-30 DIAGNOSIS — I255 Ischemic cardiomyopathy: Secondary | ICD-10-CM | POA: Diagnosis not present

## 2022-12-30 DIAGNOSIS — Z923 Personal history of irradiation: Secondary | ICD-10-CM | POA: Insufficient documentation

## 2022-12-30 DIAGNOSIS — I110 Hypertensive heart disease with heart failure: Secondary | ICD-10-CM | POA: Insufficient documentation

## 2022-12-30 DIAGNOSIS — Z953 Presence of xenogenic heart valve: Secondary | ICD-10-CM | POA: Diagnosis not present

## 2022-12-30 DIAGNOSIS — Z7982 Long term (current) use of aspirin: Secondary | ICD-10-CM | POA: Insufficient documentation

## 2022-12-30 DIAGNOSIS — Z4502 Encounter for adjustment and management of automatic implantable cardiac defibrillator: Secondary | ICD-10-CM | POA: Diagnosis not present

## 2022-12-30 DIAGNOSIS — Z87891 Personal history of nicotine dependence: Secondary | ICD-10-CM | POA: Diagnosis not present

## 2022-12-30 DIAGNOSIS — I5022 Chronic systolic (congestive) heart failure: Secondary | ICD-10-CM

## 2022-12-30 DIAGNOSIS — C3412 Malignant neoplasm of upper lobe, left bronchus or lung: Secondary | ICD-10-CM | POA: Insufficient documentation

## 2022-12-30 DIAGNOSIS — I3481 Nonrheumatic mitral (valve) annulus calcification: Secondary | ICD-10-CM | POA: Diagnosis not present

## 2022-12-30 DIAGNOSIS — Z952 Presence of prosthetic heart valve: Secondary | ICD-10-CM

## 2022-12-30 LAB — CBC
HCT: 39.8 % (ref 39.0–52.0)
Hemoglobin: 12.4 g/dL — ABNORMAL LOW (ref 13.0–17.0)
MCH: 25.2 pg — ABNORMAL LOW (ref 26.0–34.0)
MCHC: 31.2 g/dL (ref 30.0–36.0)
MCV: 80.9 fL (ref 80.0–100.0)
Platelets: 185 10*3/uL (ref 150–400)
RBC: 4.92 MIL/uL (ref 4.22–5.81)
RDW: 18.9 % — ABNORMAL HIGH (ref 11.5–15.5)
WBC: 7 10*3/uL (ref 4.0–10.5)
nRBC: 0 % (ref 0.0–0.2)

## 2022-12-30 LAB — ECHOCARDIOGRAM LIMITED: Est EF: <20

## 2022-12-30 LAB — LACTATE DEHYDROGENASE: LDH: 209 U/L — ABNORMAL HIGH (ref 98–192)

## 2022-12-30 LAB — BASIC METABOLIC PANEL
Anion gap: 12 (ref 5–15)
BUN: 13 mg/dL (ref 8–23)
CO2: 22 mmol/L (ref 22–32)
Calcium: 8.8 mg/dL — ABNORMAL LOW (ref 8.9–10.3)
Chloride: 100 mmol/L (ref 98–111)
Creatinine, Ser: 0.96 mg/dL (ref 0.61–1.24)
GFR, Estimated: >60 mL/min (ref >60)
Glucose, Bld: 95 mg/dL (ref 70–99)
Potassium: 4.2 mmol/L (ref 3.5–5.1)
Sodium: 134 mmol/L — ABNORMAL LOW (ref 135–145)

## 2022-12-30 LAB — PROTIME-INR
INR: 2.4 — ABNORMAL HIGH (ref 0.8–1.2)
Prothrombin Time: 26.4 seconds — ABNORMAL HIGH (ref 11.4–15.2)

## 2022-12-30 MED ORDER — GABAPENTIN 300 MG PO CAPS
300.0000 mg | ORAL_CAPSULE | Freq: Three times a day (TID) | ORAL | 3 refills | Status: DC
  Filled 2022-12-30: qty 90, 30d supply, fill #0
  Filled 2023-01-28: qty 90, 30d supply, fill #1
  Filled 2023-02-25: qty 90, 30d supply, fill #2
  Filled 2023-03-26: qty 90, 30d supply, fill #3

## 2022-12-30 NOTE — Progress Notes (Signed)
Speed  Flow  PI  Power  LVIDD  AI  Aortic opening MR  TR  Septum  RV  VTI (>18cm)  5800 4.9 4 4.8 6.1       midline mild                                                                           Doppler MAP: 98 Auto cuff BP:  115/103 (109)   Ramp ECHO performed at bedside per Dr Gala Romney  At completion of ramp study, patients primary controller programmed:  Fixed speed: 5800 Low speed limit: 5500    Carlton Adam RN, VAD Coordinator 24/7 pager [phone]

## 2022-12-30 NOTE — Progress Notes (Signed)
Patient presents for 2 month f/u w/RAMP echo in VAD Clinic today alone. Reports no problems with VAD equipment or concerns with drive line.  Patient says he is feeling "great" and has no limitations with activities. He completed cardiac rehab and has now joined a gym. He is really enjoying this. Denies shortness of breath, heart failure symptoms, falls, and signs of bleeding. He feels past dizzy episodes have resolved since he stopped taking diuretic. He has not taken any Torsemide since last visit.   PI events > 100 daily. Pt reports he is drinking >2 liters fluid per day. Dr. Gala Romney left his speed the same today during his RAMP echo.  Vital Signs:    Doppler Pressure: 98 Automatc BP: 115/103 (109) HR: 80   SPO2:  95% on RA   Weight: 208.5 w/ ept Last weight: 208.4 lb w/ eqt Ht: 6'   VAD Interrogation: Speed: 5800 Flow: 4.9 Power: 3.8w    PI: 4.8 Alarms: none Events: >100 PI events   Fixed speed: 5800 Low speed limit: 5500  Primary controller: back up battery due for replacement in 13 months Secondary controller:  back up battery due for replacement in 14 months    I reviewed the LVAD parameters from today and compared the results to the patient's prior recorded data. LVAD interrogation was NEGATIVE for significant power changes, NEGATIVE for clinical alarms and STABLE for PI events/speed drops. No programming changes were made and pump is functioning within specified parameters. Pt is performing daily controller and system monitor self tests along with completing weekly and monthly maintenance for LVAD equipment.   LVAD equipment check completed and is in good working order. Back-up equipment  present.    Annual Equipment Maintenance on UBC/PM was performed on 06/03/22.     Exit Site Care:  Existing VAD dressing removed and site care performed using sterile technique. Drive line exit site cleaned with Chlora prep applicators x 2, allowed to dry, and Sorbaview dressing with  Silverlon patch applied. Exit site healed and incorporated, the velour is fully implanted at exit site. No redness, tenderness, drainage, foul odor or rash at site noted. Drive line anchor re-applied over medipore tape. Pt denies fever or chills. Provided patient with 8 weekly dressing kits.  Device: N/A  (removed due to infection 12/04/20)   BP & Labs:  Doppler 98 - Doppler is reflecting modified systolic   Hgb 12.4 - No S/S of bleeding. Specifically denies melena/BRBPR or nosebleeds.   LDH stable at 209 with established baseline of 135 - 309. Denies tea-colored urine. No power elevations noted on interrogation.    Patient Instructions:  No changes in medications. Coumadin dosing per [facility]-pharm D Return to VAD Clinic in 2 months     Carlton Adam RN VAD Coordinator  Office: [phone]  24/7 Pager: [phone]

## 2022-12-30 NOTE — Progress Notes (Signed)
Echocardiogram 2D Echocardiogram has been performed.  Brandon [name] 12/30/2022, 1:32 PM

## 2023-01-07 ENCOUNTER — Telehealth: Payer: Self-pay | Admitting: *Deleted

## 2023-01-07 NOTE — Telephone Encounter (Signed)
CALLED PATIENT TO INFORM OF CT FOR 06-25-23- ARRIVAL TIME- 2:45 PM @ [facility], NO RESTRICTIONS TO TEST, PATIENT TO RECEIVE RESULTS FROM DR. KINARD ON 06-30-23 @ 11:45 AM, LVM FOR A RETURN CALL

## 2023-01-09 ENCOUNTER — Ambulatory Visit (HOSPITAL_COMMUNITY): Payer: Self-pay | Admitting: Pharmacist

## 2023-01-09 LAB — POCT INR: INR: 2.7 (ref 2.0–3.0)

## 2023-01-13 ENCOUNTER — Other Ambulatory Visit (HOSPITAL_COMMUNITY): Payer: Self-pay

## 2023-01-16 ENCOUNTER — Ambulatory Visit (HOSPITAL_COMMUNITY): Payer: Self-pay | Admitting: Pharmacist

## 2023-01-16 LAB — POCT INR: INR: 2.9 (ref 2.0–3.0)

## 2023-01-23 ENCOUNTER — Ambulatory Visit (HOSPITAL_COMMUNITY): Payer: Self-pay | Admitting: Pharmacist

## 2023-01-23 LAB — POCT INR: INR: 2.6 (ref 2.0–3.0)

## 2023-01-28 ENCOUNTER — Other Ambulatory Visit (HOSPITAL_COMMUNITY): Payer: Self-pay

## 2023-01-30 ENCOUNTER — Ambulatory Visit (HOSPITAL_COMMUNITY): Payer: Self-pay | Admitting: Pharmacist

## 2023-01-30 LAB — POCT INR: INR: 2.4 (ref 2.0–3.0)

## 2023-02-03 ENCOUNTER — Telehealth (HOSPITAL_COMMUNITY): Payer: Self-pay

## 2023-02-03 DIAGNOSIS — Z7901 Long term (current) use of anticoagulants: Secondary | ICD-10-CM

## 2023-02-03 DIAGNOSIS — Z95811 Presence of heart assist device: Secondary | ICD-10-CM

## 2023-02-03 NOTE — Telephone Encounter (Signed)
Pt called stating he has noticed several darkened stools. He recently reduced his dose of Pantoprazole because he was unsure about the frequency. Denies lightheadedness, dizziness, falls, shortness of breath, or other signs of bleeding. Pt advised to come to clinic to for labs tomorrow morning. VAD Coordinator to follow.   Simmie Davies RN, BSN VAD Coordinator 24/7 Pager [phone]

## 2023-02-04 ENCOUNTER — Ambulatory Visit (HOSPITAL_COMMUNITY): Payer: Self-pay | Admitting: Pharmacist

## 2023-02-04 ENCOUNTER — Ambulatory Visit (HOSPITAL_COMMUNITY)
Admission: RE | Admit: 2023-02-04 | Discharge: 2023-02-04 | Disposition: A | Discharge status: Home / Self Care | Payer: Medicare Other | Source: Ambulatory Visit | Attending: Cardiology | Admitting: Cardiology

## 2023-02-04 ENCOUNTER — Encounter (HOSPITAL_COMMUNITY): Payer: Self-pay

## 2023-02-04 DIAGNOSIS — Z7901 Long term (current) use of anticoagulants: Secondary | ICD-10-CM | POA: Insufficient documentation

## 2023-02-04 DIAGNOSIS — Z95811 Presence of heart assist device: Secondary | ICD-10-CM | POA: Insufficient documentation

## 2023-02-04 LAB — CBC
HCT: 35 % — ABNORMAL LOW (ref 39.0–52.0)
Hemoglobin: 10.7 g/dL — ABNORMAL LOW (ref 13.0–17.0)
MCH: 25.1 pg — ABNORMAL LOW (ref 26.0–34.0)
MCHC: 30.6 g/dL (ref 30.0–36.0)
MCV: 82 fL (ref 80.0–100.0)
Platelets: 180 10*3/uL (ref 150–400)
RBC: 4.27 MIL/uL (ref 4.22–5.81)
RDW: 18.8 % — ABNORMAL HIGH (ref 11.5–15.5)
WBC: 6.9 10*3/uL (ref 4.0–10.5)
nRBC: 0 % (ref 0.0–0.2)

## 2023-02-04 LAB — PROTIME-INR
INR: 2.7 — ABNORMAL HIGH (ref 0.8–1.2)
Prothrombin Time: 28.7 seconds — ABNORMAL HIGH (ref 11.4–15.2)

## 2023-02-04 NOTE — Progress Notes (Signed)
Pt had CBC and INR drawn today Hgb dropped to 10.7 today from 12.4 8/19. Pt remains asymptomatic. No reported bowel movement today. Discussed with Dr. Gala Romney pt will need GI evaluation and if stool remains dark tomorrow will require admission. Discussed with pt will plan for reassessing tomorrow morning to evaluate if admission needed. Will hold Coumadin tonight per MD. Leotis Shames [facility] made aware.  Simmie Davies RN, BSN VAD Coordinator 24/7 Pager [phone]

## 2023-02-05 ENCOUNTER — Other Ambulatory Visit: Payer: Self-pay

## 2023-02-05 ENCOUNTER — Inpatient Hospital Stay (HOSPITAL_COMMUNITY)
Admission: AD | Admit: 2023-02-05 | Discharge: 2023-02-10 | DRG: 377 | Disposition: A | Discharge status: Home / Self Care | Payer: Medicare Other | Source: Ambulatory Visit | Attending: Internal Medicine | Admitting: Internal Medicine

## 2023-02-05 ENCOUNTER — Encounter (HOSPITAL_COMMUNITY): Payer: Self-pay | Admitting: Internal Medicine

## 2023-02-05 ENCOUNTER — Encounter (HOSPITAL_COMMUNITY): Payer: Self-pay

## 2023-02-05 DIAGNOSIS — I2582 Chronic total occlusion of coronary artery: Secondary | ICD-10-CM | POA: Diagnosis present

## 2023-02-05 DIAGNOSIS — I472 Ventricular tachycardia, unspecified: Secondary | ICD-10-CM | POA: Diagnosis present

## 2023-02-05 DIAGNOSIS — I2510 Atherosclerotic heart disease of native coronary artery without angina pectoris: Secondary | ICD-10-CM | POA: Diagnosis present

## 2023-02-05 DIAGNOSIS — J449 Chronic obstructive pulmonary disease, unspecified: Secondary | ICD-10-CM | POA: Diagnosis not present

## 2023-02-05 DIAGNOSIS — C3490 Malignant neoplasm of unspecified part of unspecified bronchus or lung: Secondary | ICD-10-CM | POA: Diagnosis present

## 2023-02-05 DIAGNOSIS — Z95811 Presence of heart assist device: Secondary | ICD-10-CM

## 2023-02-05 DIAGNOSIS — K047 Periapical abscess without sinus: Secondary | ICD-10-CM | POA: Diagnosis present

## 2023-02-05 DIAGNOSIS — Z87442 Personal history of urinary calculi: Secondary | ICD-10-CM

## 2023-02-05 DIAGNOSIS — D62 Acute posthemorrhagic anemia: Secondary | ICD-10-CM | POA: Diagnosis present

## 2023-02-05 DIAGNOSIS — K31819 Angiodysplasia of stomach and duodenum without bleeding: Secondary | ICD-10-CM | POA: Diagnosis not present

## 2023-02-05 DIAGNOSIS — Z955 Presence of coronary angioplasty implant and graft: Secondary | ICD-10-CM | POA: Diagnosis not present

## 2023-02-05 DIAGNOSIS — Z981 Arthrodesis status: Secondary | ICD-10-CM | POA: Diagnosis not present

## 2023-02-05 DIAGNOSIS — I5022 Chronic systolic (congestive) heart failure: Secondary | ICD-10-CM | POA: Diagnosis present

## 2023-02-05 DIAGNOSIS — I4901 Ventricular fibrillation: Secondary | ICD-10-CM | POA: Diagnosis present

## 2023-02-05 DIAGNOSIS — Z79899 Other long term (current) drug therapy: Secondary | ICD-10-CM

## 2023-02-05 DIAGNOSIS — Z8261 Family history of arthritis: Secondary | ICD-10-CM

## 2023-02-05 DIAGNOSIS — K921 Melena: Secondary | ICD-10-CM

## 2023-02-05 DIAGNOSIS — K31811 Angiodysplasia of stomach and duodenum with bleeding: Principal | ICD-10-CM | POA: Diagnosis present

## 2023-02-05 DIAGNOSIS — I110 Hypertensive heart disease with heart failure: Secondary | ICD-10-CM | POA: Diagnosis present

## 2023-02-05 DIAGNOSIS — Z952 Presence of prosthetic heart valve: Secondary | ICD-10-CM

## 2023-02-05 DIAGNOSIS — Z818 Family history of other mental and behavioral disorders: Secondary | ICD-10-CM

## 2023-02-05 DIAGNOSIS — Z923 Personal history of irradiation: Secondary | ICD-10-CM

## 2023-02-05 DIAGNOSIS — I480 Paroxysmal atrial fibrillation: Secondary | ICD-10-CM | POA: Diagnosis present

## 2023-02-05 DIAGNOSIS — E785 Hyperlipidemia, unspecified: Secondary | ICD-10-CM | POA: Diagnosis present

## 2023-02-05 DIAGNOSIS — F17290 Nicotine dependence, other tobacco product, uncomplicated: Secondary | ICD-10-CM | POA: Diagnosis present

## 2023-02-05 DIAGNOSIS — D500 Iron deficiency anemia secondary to blood loss (chronic): Secondary | ICD-10-CM | POA: Diagnosis present

## 2023-02-05 DIAGNOSIS — I252 Old myocardial infarction: Secondary | ICD-10-CM

## 2023-02-05 DIAGNOSIS — I959 Hypotension, unspecified: Secondary | ICD-10-CM | POA: Diagnosis present

## 2023-02-05 DIAGNOSIS — K2970 Gastritis, unspecified, without bleeding: Secondary | ICD-10-CM | POA: Diagnosis present

## 2023-02-05 DIAGNOSIS — Z4509 Encounter for adjustment and management of other cardiac device: Secondary | ICD-10-CM

## 2023-02-05 DIAGNOSIS — Z7901 Long term (current) use of anticoagulants: Secondary | ICD-10-CM | POA: Diagnosis not present

## 2023-02-05 DIAGNOSIS — Z8249 Family history of ischemic heart disease and other diseases of the circulatory system: Secondary | ICD-10-CM

## 2023-02-05 DIAGNOSIS — N400 Enlarged prostate without lower urinary tract symptoms: Secondary | ICD-10-CM | POA: Diagnosis present

## 2023-02-05 DIAGNOSIS — Z95810 Presence of automatic (implantable) cardiac defibrillator: Secondary | ICD-10-CM

## 2023-02-05 DIAGNOSIS — Z7982 Long term (current) use of aspirin: Secondary | ICD-10-CM

## 2023-02-05 DIAGNOSIS — I5023 Acute on chronic systolic (congestive) heart failure: Secondary | ICD-10-CM

## 2023-02-05 DIAGNOSIS — D649 Anemia, unspecified: Secondary | ICD-10-CM | POA: Diagnosis not present

## 2023-02-05 DIAGNOSIS — E876 Hypokalemia: Secondary | ICD-10-CM

## 2023-02-05 DIAGNOSIS — Z86010 Personal history of colonic polyps: Secondary | ICD-10-CM

## 2023-02-05 LAB — CBC
HCT: 33.1 % — ABNORMAL LOW (ref 39.0–52.0)
Hemoglobin: 10.3 g/dL — ABNORMAL LOW (ref 13.0–17.0)
MCH: 25.4 pg — ABNORMAL LOW (ref 26.0–34.0)
MCHC: 31.1 g/dL (ref 30.0–36.0)
MCV: 81.5 fL (ref 80.0–100.0)
Platelets: 180 10*3/uL (ref 150–400)
RBC: 4.06 MIL/uL — ABNORMAL LOW (ref 4.22–5.81)
RDW: 18.5 % — ABNORMAL HIGH (ref 11.5–15.5)
WBC: 6.8 10*3/uL (ref 4.0–10.5)
nRBC: 0 % (ref 0.0–0.2)

## 2023-02-05 LAB — BASIC METABOLIC PANEL
Anion gap: 5 (ref 5–15)
BUN: 16 mg/dL (ref 8–23)
CO2: 24 mmol/L (ref 22–32)
Calcium: 8.7 mg/dL — ABNORMAL LOW (ref 8.9–10.3)
Chloride: 106 mmol/L (ref 98–111)
Creatinine, Ser: 0.94 mg/dL (ref 0.61–1.24)
GFR, Estimated: >60 mL/min (ref >60)
Glucose, Bld: 88 mg/dL (ref 70–99)
Potassium: 4.2 mmol/L (ref 3.5–5.1)
Sodium: 135 mmol/L (ref 135–145)

## 2023-02-05 LAB — PROTIME-INR
INR: 2 — ABNORMAL HIGH (ref 0.8–1.2)
Prothrombin Time: 22.7 seconds — ABNORMAL HIGH (ref 11.4–15.2)

## 2023-02-05 LAB — MRSA NEXT GEN BY PCR, NASAL: MRSA by PCR Next Gen: NOT DETECTED

## 2023-02-05 LAB — HIV ANTIBODY (ROUTINE TESTING W REFLEX): HIV Screen 4th Generation wRfx: NONREACTIVE

## 2023-02-05 MED ORDER — SODIUM CHLORIDE 0.9% FLUSH
3.0000 mL | Freq: Two times a day (BID) | INTRAVENOUS | Status: DC
  Administered 2023-02-05 – 2023-02-10 (×7): 3 mL via INTRAVENOUS

## 2023-02-05 MED ORDER — SODIUM CHLORIDE 0.9 % IV SOLN: 250.0000 mL | INTRAVENOUS | Status: DC | PRN

## 2023-02-05 MED ORDER — ACETAMINOPHEN 500 MG PO TABS: 500.0000 mg | ORAL_TABLET | Freq: Four times a day (QID) | ORAL | Status: DC | PRN

## 2023-02-05 MED ORDER — SERTRALINE HCL 25 MG PO TABS
50.0000 mg | ORAL_TABLET | Freq: Every day | ORAL | Status: DC
  Administered 2023-02-06 – 2023-02-10 (×5): 50 mg via ORAL
  Filled 2023-02-05 (×5): qty 2

## 2023-02-05 MED ORDER — LACTATED RINGERS IV BOLUS
1000.0000 mL | Freq: Once | INTRAVENOUS | Status: AC
  Administered 2023-02-05: 1000 mL via INTRAVENOUS

## 2023-02-05 MED ORDER — EZETIMIBE 10 MG PO TABS
10.0000 mg | ORAL_TABLET | Freq: Every day | ORAL | Status: DC
  Administered 2023-02-06 – 2023-02-10 (×5): 10 mg via ORAL
  Filled 2023-02-05 (×5): qty 1

## 2023-02-05 MED ORDER — TAMSULOSIN HCL 0.4 MG PO CAPS
0.4000 mg | ORAL_CAPSULE | Freq: Every day | ORAL | Status: DC
  Administered 2023-02-06 – 2023-02-10 (×5): 0.4 mg via ORAL
  Filled 2023-02-05 (×5): qty 1

## 2023-02-05 MED ORDER — METOPROLOL SUCCINATE ER 25 MG PO TB24
12.5000 mg | ORAL_TABLET | Freq: Every day | ORAL | Status: DC
  Administered 2023-02-06 – 2023-02-09 (×4): 12.5 mg via ORAL
  Filled 2023-02-05 (×5): qty 1

## 2023-02-05 MED ORDER — PANTOPRAZOLE SODIUM 40 MG PO TBEC
40.0000 mg | DELAYED_RELEASE_TABLET | Freq: Two times a day (BID) | ORAL | Status: DC
  Administered 2023-02-05 – 2023-02-10 (×10): 40 mg via ORAL
  Filled 2023-02-05 (×10): qty 1

## 2023-02-05 MED ORDER — ACETAMINOPHEN 325 MG PO TABS: 650.0000 mg | ORAL_TABLET | ORAL | Status: DC | PRN

## 2023-02-05 MED ORDER — CHLORHEXIDINE GLUCONATE CLOTH 2 % EX PADS
6.0000 | MEDICATED_PAD | Freq: Once | CUTANEOUS | Status: AC
  Administered 2023-02-05: 6 via TOPICAL

## 2023-02-05 MED ORDER — GABAPENTIN 300 MG PO CAPS
300.0000 mg | ORAL_CAPSULE | Freq: Three times a day (TID) | ORAL | Status: DC
  Administered 2023-02-05 – 2023-02-10 (×15): 300 mg via ORAL
  Filled 2023-02-05 (×16): qty 1

## 2023-02-05 MED ORDER — POTASSIUM CHLORIDE CRYS ER 20 MEQ PO TBCR
20.0000 meq | EXTENDED_RELEASE_TABLET | Freq: Every day | ORAL | Status: DC
  Administered 2023-02-06: 20 meq via ORAL
  Filled 2023-02-05: qty 1

## 2023-02-05 MED ORDER — LOSARTAN POTASSIUM 25 MG PO TABS
12.5000 mg | ORAL_TABLET | Freq: Every day | ORAL | Status: DC
  Administered 2023-02-06 – 2023-02-09 (×4): 12.5 mg via ORAL
  Filled 2023-02-05 (×5): qty 1

## 2023-02-05 MED ORDER — ONDANSETRON HCL 4 MG/2ML IJ SOLN: 4.0000 mg | Freq: Four times a day (QID) | INTRAMUSCULAR | Status: DC | PRN

## 2023-02-05 NOTE — H&P (Addendum)
Advanced Heart Failure VAD History and Physical Note   PCP-Cardiologist: Olga Millers, MD   Reason for Admission: Symptomatic Anemia   HPI:   Mr Marks is a 67 year old with a history of  tobacco use, CAD s/p previous anterior MI, severe systolic HF s/p HM-3 VAD 04/06/21, barostim, severe AS s/p AVR, endocarditis.   Admitted for cardiogenic shock in 02/2021.  He underwent placement of HM3 VAD and aortic valve replacement with 25 mm Edwards Inspiris Resilia valve on 04/06/21. Surgical path came back 11/28 with evidence of possible "acute aortic valve endocarditis". 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   Admitted 04/2022 with ventricular fibrillation.  Was defibrillated.  Then on Christmas day he presented back with chest pain, nausea, and shortness of breath.  ECG showed ST elevation.  Catheterization showed stable CAD. Cardiac CT suspicious for clot on AoV. INR increased to 2.5-3.0 range.    Had PET 1/24 which showed increased size of the spiculated LUL pulmonary nodule which is hypermetabolic concerning for primary bronchogenic carcinoma. Has seen Dr. Dorris Fetch and referred for stereotactic radiation without biopsy. Completed radiation.    He was feeling great until a week ago. Complaining of dark stool for the last week. Yesterday he had lab work in the VAD clinic. Hgb trending down 12.4>10.7. He was instructed to hold coumadin. INR 2.7. He took aspirin this morning. Today is complaining lightheadedness, dyspnea with exertion, and black stool.   LVAD INTERROGATION:  HeartMate III LVAD:  Flow 4.5 liters/min, speed 5800, power 4.5  PI 7 Multiple PI events   Review of Systems: [y] = yes, [ ]  = no   General: Weight gain [ ] ; Weight loss [ ] ; Anorexia [ ] ; Fatigue [ ] ; Fever [ ] ; Chills [ ] ; Weakness [ ]   Cardiac: Chest pain/pressure [ ] ; Resting SOB [ ] ; Exertional SOB [ Y]; Orthopnea [ ] ; Pedal Edema [ ] ; Palpitations [  ]; Syncope [ ] ; Presyncope [ ] ; Paroxysmal nocturnal dyspnea[ ]   Pulmonary: Cough [ ] ; Wheezing[ ] ; Hemoptysis[ ] ; Sputum [ ] ; Snoring [ ]   GI: Vomiting[ ] ; Dysphagia[ ] ; Melena[ ] ; Hematochezia [ ] ; Heartburn[ ] ; Abdominal pain [ ] ; Constipation [ ] ; Diarrhea [ ] ; BRBPR [ ]   GU: Hematuria[ ] ; Dysuria [ ] ; Nocturia[ ]   Vascular: Pain in legs with walking [ ] ; Pain in feet with lying flat [ ] ; Non-healing sores [ ] ; Stroke [ ] ; TIA [ ] ; Slurred speech [ ] ;  Neuro: Headaches[ ] ; Vertigo[ ] ; Seizures[ ] ; Paresthesias[ ] ;Blurred vision [ ] ; Diplopia [ ] ; Vision changes [ ]   Ortho/Skin: Arthritis [ ] ; Joint pain [Y ]; Muscle pain [ ] ; Joint swelling [ ] ; Back Pain [ Y]; Rash [ ]   Psych: Depression[ ] ; Anxiety[ ]   Heme: Bleeding problems [ ] ; Clotting disorders [ ] ; Anemia [ Y]  Endocrine: Diabetes [ ] ; Thyroid dysfunction[ ]     Home Medications Prior to Admission medications   Medication Sig Start Date End Date Taking? Authorizing Provider  acetaminophen (TYLENOL) 500 MG tablet Take 500-1,000 mg by mouth every 6 (six) hours as needed (for pain).    [provider]  aspirin EC 81 MG tablet Take 1 tablet (81 mg total) by mouth daily. Swallow whole. 05/12/22   Dunn, Tacey Ruiz, PA-C  atorvastatin (LIPITOR) 80 MG tablet PATIENT MUST ATTEND ANNUAL APPOINTMENT FOR FUTURE REFILLS TAKE 1 TABLET BY MOUTH ONCE DAILY AT 6PM. 10/02/22  Lewayne Bunting, MD  Carboxymethylcellulose Sodium (ARTIFICIAL TEARS OP) Place 1 drop into both eyes daily as needed (for dryness or irritation).    [provider]  eplerenone (INSPRA) 50 MG tablet Take 1 tablet (50 mg total) by mouth in the morning. 08/28/21   Del Wiseman, Bevelyn Buckles, MD  ezetimibe (ZETIA) 10 MG tablet TAKE ONE TABLET BY MOUTH DAILY 07/22/22   Brain Honeycutt, Bevelyn Buckles, MD  gabapentin (NEURONTIN) 300 MG capsule Take 1 capsule (300 mg total) by mouth 3 (three) times daily. 12/30/22   Linas Stepter, Bevelyn Buckles, MD  KLOR-CON M20 20 MEQ tablet TAKE 1 TABLET BY MOUTH  EVERY DAY 09/16/22   Laurey Morale, MD  losartan (COZAAR) 25 MG tablet Take 0.5 tablets (12.5 mg total) by mouth daily. 03/29/22   Robbie Lis M, PA-C  metoprolol succinate (TOPROL-XL) 25 MG 24 hr tablet TAKE 1/2 TABLET BY MOUTH EVERY DAY 10/09/22   Juanda Crumble K, PA-C  nystatin (MYCOSTATIN/NYSTOP) powder Apply 1 Application topically 3 (three) times daily. 10/10/22   Chanah Tidmore, Bevelyn Buckles, MD  pantoprazole (PROTONIX) 40 MG tablet Take 1 tablet (40 mg total) by mouth 2 (two) times daily. 08/22/22   Kyriana Yankee, Bevelyn Buckles, MD  sertraline (ZOLOFT) 50 MG tablet TAKE 1/2 TAB BY MOUTH DAILY FOR 1 WEEK, THEN INCREASE TO 1 TABLET DAILY. Patient taking differently: Take 50 mg by mouth daily. Pt taking 50 mg daily 06/26/22   Shany Marinez, Bevelyn Buckles, MD  tamsulosin (FLOMAX) 0.4 MG CAPS capsule Take 2 capsules (0.8 mg total) by mouth daily. Patient taking differently: Take 0.4 mg by mouth daily. 10/25/22   Deeann Saint, MD  torsemide (DEMADEX) 20 MG tablet Take 1 tablet (20 mg total) by mouth as needed. Patient not taking: Reported on 12/23/2022 02/20/22   Faizon Capozzi, Bevelyn Buckles, MD  traMADol (ULTRAM) 50 MG tablet TAKE 1-2 TABLETS BY MOUTH TWICE A DAY FOR 30 DAYS Patient not taking: Reported on 12/30/2022 10/25/22   Deeann Saint, MD  warfarin (COUMADIN) 2.5 MG tablet TAKE 7.5 MG (3 TABLETS) EVERY MONDAY/FRIDAY AND 5 MG (2 TABLETS) ALL OTHER DAYS OR AS INSTRUCTED BY LVAD CLINIC. 12/25/22   Analya Louissaint, Bevelyn Buckles, MD  ZYRTEC ALLERGY 10 MG tablet Take 10 mg by mouth daily.    [provider]    Past Medical History: Past Medical History:  Diagnosis Date   AICD (automatic cardioverter/defibrillator) present 08/31/2019   Ankylosing spondylitis (HCC)    Arthritis    BENIGN PROSTATIC HYPERTROPHY 06/07/2008   CHF (congestive heart failure) (HCC)    COLONIC POLYPS, HX OF 06/07/2008   Coronary artery disease    Depression    H/O hiatal hernia    Heart failure (HCC)    History of radiation therapy     Left Lung- 08/06/22-08/16/22- Dr. Antony Blackbird   HYPERLIPIDEMIA 06/07/2008   HYPERTENSION 06/07/2008   Myocardial infarction [facility]) 2005   NSTEMI, s/p LAD stent   NEPHROLITHIASIS, HX OF 06/07/2008   STEMI (ST elevation myocardial infarction) (HCC) 04/27/2019    Past Surgical History: Past Surgical History:  Procedure Laterality Date   AORTIC VALVE REPLACEMENT N/A 04/06/2021   Procedure: AORTIC VALVE REPLACEMENT (AVR) WITH INSPIRIS RESILIA  AORTIC VALVE SIZE ;  Surgeon: Alleen Borne, MD;  Location: [facility] OR;  Service: Open Heart Surgery;  Laterality: N/A;   COLONOSCOPY WITH PROPOFOL N/A 04/24/2020   Procedure: COLONOSCOPY WITH PROPOFOL;  Surgeon: Benancio Deeds, MD;  Location: [facility] ENDOSCOPY;  Service: Gastroenterology;  Laterality: N/A;   CORONARY  ANGIOPLASTY WITH STENT PLACEMENT  2005   LAD stent, jailed diagonal   CORONARY/GRAFT ACUTE MI REVASCULARIZATION N/A 04/27/2019   Procedure: Coronary/Graft Acute MI Revascularization;  Surgeon: Corky Crafts, MD;  Location: [facility] INVASIVE CV LAB;  Service: Cardiovascular;  Laterality: N/A;   ICD IMPLANT  08/31/2019   ICD IMPLANT N/A 08/31/2019   Procedure: ICD IMPLANT;  Surgeon: Hillis Range, MD;  Location: [facility] INVASIVE CV LAB;  Service: Cardiovascular;  Laterality: N/A;   ICD LEAD REMOVAL N/A 12/04/2020   Procedure: ICD SYSTEM EXTRACTION;  Surgeon: Lanier Prude, MD;  Location: [facility] OR;  Service: Cardiovascular;  Laterality: N/A;   INSERTION OF IMPLANTABLE LEFT VENTRICULAR ASSIST DEVICE N/A 04/06/2021   Procedure: INSERTION OF IMPLANTABLE LEFT VENTRICULAR ASSIST DEVICE;  Surgeon: Alleen Borne, MD;  Location: [facility] OR;  Service: Open Heart Surgery;  Laterality: N/A;  HM3   IR THORACENTESIS ASP PLEURAL SPACE W/IMG GUIDE  04/19/2021   IR THORACENTESIS ASP PLEURAL SPACE W/IMG GUIDE  04/20/2021   IR THORACENTESIS ASP PLEURAL SPACE W/IMG GUIDE  04/23/2021   LAMINECTOMY     lumbar   LEFT HEART CATH AND CORONARY ANGIOGRAPHY N/A 04/27/2019    Procedure: LEFT HEART CATH AND CORONARY ANGIOGRAPHY;  Surgeon: Corky Crafts, MD;  Location: [facility] INVASIVE CV LAB;  Service: Cardiovascular;  Laterality: N/A;   LUMBAR FUSION  01/2012   POLYPECTOMY  04/24/2020   Procedure: POLYPECTOMY;  Surgeon: Benancio Deeds, MD;  Location: [facility]COPY;  Service: Gastroenterology;;   RIGHT HEART CATH N/A 04/27/2019   Procedure: RIGHT HEART CATH;  Surgeon: [place]il[place], Demaurion M, MD;  Location: [facility] INVASIVE CV LAB;  Service: Cardiovascular;  Laterality: N/A;   RIGHT HEART CATH N/A 03/28/2021   Procedure: RIGHT HEART CATH;  Surgeon: Dolores Patty, MD;  Location: [facility] INVASIVE CV LAB;  Service: Cardiovascular;  Laterality: N/A;   RIGHT HEART CATH AND CORONARY ANGIOGRAPHY N/A 05/06/2022   Procedure: RIGHT HEART CATH AND CORONARY ANGIOGRAPHY;  Surgeon: Dolores Patty, MD;  Location: [facility] INVASIVE CV LAB;  Service: Cardiovascular;  Laterality: N/A;   RIGHT/LEFT HEART CATH AND CORONARY ANGIOGRAPHY N/A 11/29/2020   Procedure: RIGHT/LEFT HEART CATH AND CORONARY ANGIOGRAPHY;  Surgeon: Dolores Patty, MD;  Location: [facility] INVASIVE CV LAB;  Service: Cardiovascular;  Laterality: N/A;   TEE WITHOUT CARDIOVERSION N/A 12/04/2020   Procedure: TRANSESOPHAGEAL ECHOCARDIOGRAM (TEE);  Surgeon: Lanier Prude, MD;  Location: [facility] OR;  Service: Cardiovascular;  Laterality: N/A;   TEE WITHOUT CARDIOVERSION N/A 04/06/2021   Procedure: TRANSESOPHAGEAL ECHOCARDIOGRAM (TEE);  Surgeon: Alleen Borne, MD;  Location: [facility] OR;  Service: Open Heart Surgery;  Laterality: N/A;   TONSILLECTOMY     warthin tumor removal Left 2014    Family History: Family History  Problem Relation Age of Onset   Depression Father    Arthritis Sister        RA   Heart failure Mother    Other Neg Hx    Colon cancer Neg Hx    Esophageal cancer Neg Hx    Rectal cancer Neg Hx    Stomach cancer Neg Hx    Colon polyps Neg Hx     Social History: Social History   Socioeconomic History    Marital status: Single    Spouse name: Not on file   Number of children: Not on file   Years of education: Not on file   Highest education level: Some college, no degree  Occupational History   Occupation: Production designer, theatre/television/film  Employer: [employer]   Occupation: Retired  Tobacco Use   Smoking status: Former    Current packs/day: 0.00    Average packs/day: 1 pack/day for 40.0 years (40.0 ttl pk-yrs)    Types: Cigarettes    Start date: 07/26/1979    Quit date: 07/26/2019    Years since quitting: 3.5   Smokeless tobacco: Never   Tobacco comments:    1-1.5 pks per year x 40-45 yrs  Vaping Use   Vaping status: Some Days   Substances: CBD  Substance and Sexual Activity   Alcohol use: Yes    Alcohol/week: 7.0 standard drinks of alcohol    Types: 7 Shots of liquor per week    Comment: "2 vodka tonics a night"   Drug use: Yes    Comment: occasionally - CBD oil   Sexual activity: Not Currently  Other Topics Concern   Not on file  Social History Narrative   Lives in [place] alone      Social Determinants of Health   Financial Resource Strain: Low Risk  (07/26/2022)   Overall Financial Resource Strain (CARDIA)    Difficulty of Paying Living Expenses: Not hard at all  Food Insecurity: No Food Insecurity (10/18/2022)   Hunger Vital Sign    Worried About Running Out of Food in the Last Year: Never true    Ran Out of Food in the Last Year: Never true  Transportation Needs: No Transportation Needs (07/26/2022)   PRAPARE - Administrator, Civil Service (Medical): No    Lack of Transportation (Non-Medical): No  Physical Activity: Sufficiently Active (09/10/2021)   Exercise Vital Sign    Days of Exercise per Week: 5 days    Minutes of Exercise per Session: 30 min  Recent Concern: Physical Activity - Insufficiently Active (07/19/2021)   Exercise Vital Sign    Days of Exercise per Week: 5 days    Minutes of Exercise per Session: 20 min  Stress: No Stress Concern Present (07/26/2022)    [employer] of Occupational Health - Occupational Stress Questionnaire    Feeling of Stress : Not at all  Social Connections: Socially Isolated (07/26/2022)   Social Connection and Isolation Panel [NHANES]    Frequency of Communication with Friends and Family: More than three times a week    Frequency of Social Gatherings with Friends and Family: More than three times a week    Attends Religious Services: Never    Database administrator or Organizations: No    Attends Engineer, structural: Never    Marital Status: Never married    Allergies:  No Known Allergies  Objective:    Vital Signs:   Temp:  [98 F (36.7 C)] 98 F (36.7 C) (09/25 1541) Pulse Rate:  [79] 79 (09/25 1541) Resp:  [15] 15 (09/25 1541) BP: (122)/(79) 122/79 (09/25 1541) Weight:  [92 kg] 92 kg (09/25 1538)   Filed Weights   02/05/23 1538  Weight: 92 kg    Mean arterial Pressure 90  Physical Exam    General:  No resp difficulty HEENT: Normal Neck: supple. JVP .7-8  Carotids 2+ bilat; no bruits. No lymphadenopathy or thyromegaly appreciated. Cor: Mechanical heart sounds with LVAD hum present. Lungs: Clear Abdomen: soft, nontender, nondistended. No hepatosplenomegaly. No bruits or masses. Good bowel sounds. Driveline: C/D/I; securement device intact and driveline incorporated Extremities: no cyanosis, clubbing, rash, edema Neuro: alert & orientedx3, cranial nerves grossly intact. moves all 4 extremities w/o difficulty. Affect pleasant  Telemetry  SR 90s hard to tell with Barostim/HMIII VAD   EKG    Labs    Basic Metabolic Panel: No results for input(s): "NA", "K", "CL", "CO2", "GLUCOSE", "BUN", "CREATININE", "CALCIUM", "MG", "PHOS" in the last 168 hours.  Liver Function Tests: No results for input(s): "AST", "ALT", "ALKPHOS", "BILITOT", "PROT", "ALBUMIN" in the last 168 hours. No results for input(s): "LIPASE", "AMYLASE" in the last 168 hours. No results for input(s): "AMMONIA"  in the last 168 hours.  CBC: Recent Labs  Lab 02/04/23 0852  WBC 6.9  HGB 10.7*  HCT 35.0*  MCV 82.0  PLT 180    Cardiac Enzymes: No results for input(s): "CKTOTAL", "CKMB", "CKMBINDEX", "TROPONINI" in the last 168 hours.  BNP: BNP (last 3 results) No results for input(s): "BNP" in the last 8760 hours.  ProBNP (last 3 results) No results for input(s): "PROBNP" in the last 8760 hours.   CBG: No results for input(s): "GLUCAP" in the last 168 hours.  Coagulation Studies: Recent Labs    02/04/23 0852  LABPROT 28.7*  INR 2.7*    Other results: EKG: .   Imaging    No results found.    Patient Profile:  Mr Airey is a 67 year old with a history of  tobacco use, CAD s/p previous anterior MI, severe systolic HF s/p HM-3 VAD 04/06/21, barostim, severe AS s/p AVR, endocarditis.   Admitted with symptomatic anemia.    Assessment/Plan:    1. Symptomatic Anemia  -Melena x 7 days and started developed concerning symptom. Lightheaded.  -Hgb 12>10.7 . Stop aspirin. He took aspirin prior to admit. Hold coumadin.  -Taking protonix daily. Increase to twice daily.   - Check CBC  - Consult GI.  - Place on clear liquids. Given 1 liters LR  2. Chronic HFrEF, ICU  -Echo 05/19/20 EF 20-25% RV mildly HK. moderate AS  Mean gradient 13 AVA 1.2 cm2 DI 0.30. -Echo 12/30/22: LVEF < 20% LVIDd 6.1cm. Cannula well placed. RV mildly HK. AoV not opening. No AI.  - s/p HM-III VAD + bioprosthetic AVR 04/06/21 - Volume status stable. Can use torsemide as needed.  - Continue current dose to Toprol XL and Losartan.   3. HMIII LVAD, DT  -Hold ASA and coumadin.  -Check INR, LDH  4. VT/VF -Most recent 12/20223  5. CAD  - s/p anterior STEMI (12/20). LHC showed chronically occluded RCA (with L>>R collaterals) and thrombotic occlusion of proximal LAD. Underwent PCI of LAD. - Recurrent anterior STEMI in 12/23 Cath 12/23 LAD stent patent. Felt to be embolic from aortic valve - Continue  atorvastatin 80 mg daily   6. PAF - He has been off amio.   7. Lung Cancer Completed radiation   Check LDH, INR, BMET, CBC   I reviewed the LVAD parameters from today, and compared the results to the patient's prior recorded data.  No programming changes were made.  The LVAD is functioning within specified parameters.  The patient performs LVAD self-test daily.  LVAD interrogation was negative for any significant power changes, alarms or PI events/speed drops.  LVAD equipment check completed and is in good working order.  Back-up equipment present.   LVAD education done on emergency procedures and precautions and reviewed exit site care.  Length of Stay: 0  Tonye Becket, NP 02/05/2023, 4:07 PM  VAD Team Pager [phone] (7am - 7am) +++VAD ISSUES ONLY+++   Advanced Heart Failure Team Pager [phone] (M-F; 7a - 5p)  Please contact [facility] for night-coverage  after hours (5p -7a ) and weekends on [email or web address] for all non- LVAD Issues   Patient seen and examined with the above-signed Advanced Practice Provider and/or Housestaff. I personally reviewed laboratory data, imaging studies and relevant notes. I independently examined the patient and formulated the important aspects of the plan. I have edited the note to reflect any of my changes or salient points. I have personally discussed the plan with the patient and/or family.  67 y/o male with severe systolic HF due to iCM now s/p HM-III VAD presents with several days of melena associated with decreased exercise tolerance and lightheadedness. No ab pain. Hgb 13.0 -> 10.7 INR 2.7 (we have run him high due to concern for clot on AoV).   VAD interrogated personally: Innumerable PIs today. No low flows  General:  NAD.  HEENT: normal  Neck: supple. JVP not elevated.  Carotids 2+ bilat; no bruits. No lymphadenopathy or thryomegaly appreciated. Cor: LVAD hum.  Lungs: Clear. Abdomen: soft, nontender, non-distended. No hepatosplenomegaly. No  bruits or masses. Good bowel sounds. Driveline site clean. Anchor in place.  Extremities: no cyanosis, clubbing, rash. Warm no edema  Neuro: alert & oriented x 3. No focal deficits. Moves all 4 without problem   Will need GI to see for endoscopy. Give IVF and start PPI. Hold warfarin. Follow CBC.   Arvilla Meres, MD  6:19 PM

## 2023-02-05 NOTE — Progress Notes (Addendum)
ANTICOAGULATION CONSULT NOTE - Initial Consult  Pharmacy Consult for warfarin Indication:  LVAD  No Known Allergies  Patient Measurements: Height: 6' (182.9 cm) Weight: 92 kg (202 lb 13.2 oz) IBW/kg (Calculated) : 77.6 Heparin Dosing Weight: 92 kg   Vital Signs: Temp: 98 F (36.7 C) (09/25 1541) Temp Source: Oral (09/25 1541) BP: 122/79 (09/25 1541) Pulse Rate: 79 (09/25 1541)  Labs: Recent Labs    02/04/23 0852  HGB 10.7*  HCT 35.0*  PLT 180  LABPROT 28.7*  INR 2.7*    CrCl cannot be calculated (Patient's most recent lab result is older than the maximum 21 days allowed.).   Medical History: Past Medical History:  Diagnosis Date   AICD (automatic cardioverter/defibrillator) present 08/31/2019   Ankylosing spondylitis (HCC)    Arthritis    BENIGN PROSTATIC HYPERTROPHY 06/07/2008   CHF (congestive heart failure) (HCC)    COLONIC POLYPS, HX OF 06/07/2008   Coronary artery disease    Depression    H/O hiatal hernia    Heart failure (HCC)    History of radiation therapy    Left Lung- 08/06/22-08/16/22- Dr. Antony Blackbird   HYPERLIPIDEMIA 06/07/2008   HYPERTENSION 06/07/2008   Myocardial infarction (HCC) 2005   NSTEMI, s/p LAD stent   NEPHROLITHIASIS, HX OF 06/07/2008   STEMI (ST elevation myocardial infarction) (HCC) 04/27/2019    Medications:  Scheduled:   ezetimibe  10 mg Oral Daily   gabapentin  300 mg Oral TID   losartan  12.5 mg Oral Daily   metoprolol succinate  12.5 mg Oral Daily   pantoprazole  40 mg Oral BID   potassium chloride SA  20 mEq Oral Daily   sertraline  50 mg Oral Daily   sodium chloride flush  3 mL Intravenous Q12H   tamsulosin  0.4 mg Oral Daily    Assessment: 67 yom with known history of HM3 LVAD 03/2021 - on warfarin PTA.  INR yesterday was 2.7 - warfarin was supposed to be held given dark stool last week with Hgb down from 12.4>10.7. Hgb 10.7, plt 180. Will recheck INR today but given continued melena and lightheaded will plan  to consult GI.   Goal of Therapy:  INR 2.5-3  due to clotting on AoV and possible coronary embolism 12/23  Monitor platelets by anticoagulation protocol: Yes   Plan:  Hold warfarin Will monitor INR and follow GI recommendations  Thank you for allowing pharmacy to participate in this patient's care,  Sherron Monday, PharmD, BCCCP Clinical Pharmacist  Phone: [phone] 02/05/2023 4:35 PM  Please check AMION for all [facility] phone numbers After 10:00 PM, call Main Pharmacy [phone]

## 2023-02-05 NOTE — Plan of Care (Signed)
Problem: Education: Goal: Understanding of CV disease, CV risk reduction, and recovery process will improve Outcome: Progressing Goal: Individualized Educational Video(s) Outcome: Progressing   Problem: Activity: Goal: Ability to return to baseline activity level will improve Outcome: Progressing   Problem: Cardiovascular: Goal: Ability to achieve and maintain adequate cardiovascular perfusion will improve Outcome: Progressing Goal: Vascular access site(s) Level 0-1 will be maintained Outcome: Progressing   Problem: Health Behavior/Discharge Planning: Goal: Ability to safely manage health-related needs after discharge will improve Outcome: Progressing   Problem: Education: Goal: Patient will understand all VAD equipment and how it functions Outcome: Progressing Goal: Patient will be able to verbalize current INR target range and antiplatelet therapy for discharge home Outcome: Progressing   Problem: Cardiac: Goal: LVAD will function as expected and patient will experience no clinical alarms Outcome: Progressing   Problem: Education: Goal: Knowledge of General Education information will improve Description: Including pain rating scale, medication(s)/side effects and non-pharmacologic comfort measures Outcome: Progressing   Problem: Health Behavior/Discharge Planning: Goal: Ability to manage health-related needs will improve Outcome: Progressing   Problem: Clinical Measurements: Goal: Ability to maintain clinical measurements within normal limits will improve Outcome: Progressing Goal: Will remain free from infection Outcome: Progressing Goal: Diagnostic test results will improve Outcome: Progressing Goal: Respiratory complications will improve Outcome: Progressing Goal: Cardiovascular complication will be avoided Outcome: Progressing   Problem: Activity: Goal: Risk for activity intolerance will decrease Outcome: Progressing   Problem: Nutrition: Goal: Adequate  nutrition will be maintained Outcome: Progressing   Problem: Coping: Goal: Level of anxiety will decrease Outcome: Progressing   Problem: Elimination: Goal: Will not experience complications related to bowel motility Outcome: Progressing Goal: Will not experience complications related to urinary retention Outcome: Progressing   Problem: Pain Managment: Goal: General experience of comfort will improve Outcome: Progressing   Problem: Safety: Goal: Ability to remain free from injury will improve Outcome: Progressing

## 2023-02-05 NOTE — Plan of Care (Signed)
  Problem: Activity: Goal: Ability to return to baseline activity level will improve Outcome: Progressing   Problem: Cardiovascular: Goal: Ability to achieve and maintain adequate cardiovascular perfusion will improve Outcome: Progressing   Problem: Education: Goal: Patient will understand all VAD equipment and how it functions Outcome: Progressing Goal: Patient will be able to verbalize current INR target range and antiplatelet therapy for discharge home Outcome: Progressing   Problem: Education: Goal: Knowledge of General Education information will improve Description: Including pain rating scale, medication(s)/side effects and non-pharmacologic comfort measures Outcome: Progressing

## 2023-02-06 ENCOUNTER — Inpatient Hospital Stay (HOSPITAL_COMMUNITY): Payer: Medicare Other | Admitting: Anesthesiology

## 2023-02-06 ENCOUNTER — Encounter (HOSPITAL_COMMUNITY)
Admission: AD | Disposition: A | Discharge status: Home / Self Care | Payer: Self-pay | Source: Ambulatory Visit | Attending: Internal Medicine

## 2023-02-06 DIAGNOSIS — K31811 Angiodysplasia of stomach and duodenum with bleeding: Secondary | ICD-10-CM | POA: Diagnosis not present

## 2023-02-06 DIAGNOSIS — K921 Melena: Secondary | ICD-10-CM

## 2023-02-06 DIAGNOSIS — K31819 Angiodysplasia of stomach and duodenum without bleeding: Secondary | ICD-10-CM | POA: Diagnosis not present

## 2023-02-06 DIAGNOSIS — J449 Chronic obstructive pulmonary disease, unspecified: Secondary | ICD-10-CM | POA: Diagnosis not present

## 2023-02-06 DIAGNOSIS — D649 Anemia, unspecified: Secondary | ICD-10-CM | POA: Diagnosis not present

## 2023-02-06 HISTORY — PX: HOT HEMOSTASIS: SHX5433

## 2023-02-06 HISTORY — PX: ESOPHAGOGASTRODUODENOSCOPY (EGD) WITH PROPOFOL: SHX5813

## 2023-02-06 LAB — PROTIME-INR
INR: 1.8 — ABNORMAL HIGH (ref 0.8–1.2)
Prothrombin Time: 20.9 seconds — ABNORMAL HIGH (ref 11.4–15.2)

## 2023-02-06 LAB — BASIC METABOLIC PANEL
Anion gap: 7 (ref 5–15)
BUN: 12 mg/dL (ref 8–23)
CO2: 27 mmol/L (ref 22–32)
Calcium: 8.7 mg/dL — ABNORMAL LOW (ref 8.9–10.3)
Chloride: 104 mmol/L (ref 98–111)
Creatinine, Ser: 0.91 mg/dL (ref 0.61–1.24)
GFR, Estimated: >60 mL/min (ref >60)
Glucose, Bld: 95 mg/dL (ref 70–99)
Potassium: 4 mmol/L (ref 3.5–5.1)
Sodium: 138 mmol/L (ref 135–145)

## 2023-02-06 LAB — CBC
HCT: 34.4 % — ABNORMAL LOW (ref 39.0–52.0)
Hemoglobin: 10.7 g/dL — ABNORMAL LOW (ref 13.0–17.0)
MCH: 25.3 pg — ABNORMAL LOW (ref 26.0–34.0)
MCHC: 31.1 g/dL (ref 30.0–36.0)
MCV: 81.3 fL (ref 80.0–100.0)
Platelets: 178 10*3/uL (ref 150–400)
RBC: 4.23 MIL/uL (ref 4.22–5.81)
RDW: 18.6 % — ABNORMAL HIGH (ref 11.5–15.5)
WBC: 6.5 10*3/uL (ref 4.0–10.5)
nRBC: 0 % (ref 0.0–0.2)

## 2023-02-06 LAB — MAGNESIUM: Magnesium: 2 mg/dL (ref 1.7–2.4)

## 2023-02-06 LAB — LACTATE DEHYDROGENASE: LDH: 137 U/L (ref 98–192)

## 2023-02-06 SURGERY — ESOPHAGOGASTRODUODENOSCOPY (EGD) WITH PROPOFOL
Anesthesia: Monitor Anesthesia Care

## 2023-02-06 MED ORDER — PHENYLEPHRINE HCL-NACL 20-0.9 MG/250ML-% IV SOLN
INTRAVENOUS | Status: DC | PRN
  Administered 2023-02-06: 25 ug/min via INTRAVENOUS

## 2023-02-06 MED ORDER — ATORVASTATIN CALCIUM 80 MG PO TABS
80.0000 mg | ORAL_TABLET | Freq: Every day | ORAL | Status: DC
  Administered 2023-02-06 – 2023-02-10 (×5): 80 mg via ORAL
  Filled 2023-02-06 (×5): qty 1

## 2023-02-06 MED ORDER — SODIUM CHLORIDE 0.9 % IV SOLN: INTRAVENOUS | Status: DC

## 2023-02-06 MED ORDER — PROPOFOL 500 MG/50ML IV EMUL
INTRAVENOUS | Status: DC | PRN
  Administered 2023-02-06: 40 ug/kg/min via INTRAVENOUS

## 2023-02-06 MED ORDER — PROPOFOL 10 MG/ML IV BOLUS
INTRAVENOUS | Status: DC | PRN
  Administered 2023-02-06 (×2): 10 mg via INTRAVENOUS
  Administered 2023-02-06: 20 mg via INTRAVENOUS

## 2023-02-06 MED ORDER — WARFARIN - PHARMACIST DOSING INPATIENT: Freq: Every day | Status: DC

## 2023-02-06 MED ORDER — WARFARIN SODIUM 3 MG PO TABS
3.0000 mg | ORAL_TABLET | Freq: Once | ORAL | Status: AC
  Administered 2023-02-06: 3 mg via ORAL
  Filled 2023-02-06: qty 1

## 2023-02-06 MED ORDER — ONDANSETRON HCL 4 MG/2ML IJ SOLN
INTRAMUSCULAR | Status: DC | PRN
  Administered 2023-02-06: 4 mg via INTRAVENOUS

## 2023-02-06 MED ORDER — WARFARIN SODIUM 5 MG PO TABS: 5.0000 mg | ORAL_TABLET | Freq: Once | ORAL | Status: DC

## 2023-02-06 MED ORDER — PHENYLEPHRINE 80 MCG/ML (10ML) SYRINGE FOR IV PUSH (FOR BLOOD PRESSURE SUPPORT)
PREFILLED_SYRINGE | INTRAVENOUS | Status: DC | PRN
  Administered 2023-02-06 (×3): 80 ug via INTRAVENOUS

## 2023-02-06 SURGICAL SUPPLY — 15 items

## 2023-02-06 NOTE — Progress Notes (Addendum)
ANTICOAGULATION CONSULT NOTE  Pharmacy Consult for warfarin Indication:  LVAD  No Known Allergies  Patient Measurements: Height: 6' (182.9 cm) Weight: 90.6 kg (199 lb 11.8 oz) IBW/kg (Calculated) : 77.6 Heparin Dosing Weight: 92 kg   Vital Signs: Temp: 97.4 F (36.3 C) (09/26 1130) Temp Source: Temporal (09/26 0942) BP: 89/68 (09/26 1200) Pulse Rate: 61 (09/26 1211)  Labs: Recent Labs    02/04/23 0852 02/05/23 1819 02/06/23 0234  HGB 10.7* 10.3* 10.7*  HCT 35.0* 33.1* 34.4*  PLT 180 180 178  LABPROT 28.7* 22.7* 20.9*  INR 2.7* 2.0* 1.8*  CREATININE  --  0.94 0.91    Estimated Creatinine Clearance: 84.1 mL/min (by C-G formula based on SCr of 0.91 mg/dL).   Medical History: Past Medical History:  Diagnosis Date   AICD (automatic cardioverter/defibrillator) present 08/31/2019   Ankylosing spondylitis (HCC)    Arthritis    BENIGN PROSTATIC HYPERTROPHY 06/07/2008   CHF (congestive heart failure) (HCC)    COLONIC POLYPS, HX OF 06/07/2008   Coronary artery disease    Depression    H/O hiatal hernia    Heart failure (HCC)    History of radiation therapy    Left Lung- 08/06/22-08/16/22- Dr. Antony Blackbird   HYPERLIPIDEMIA 06/07/2008   HYPERTENSION 06/07/2008   Myocardial infarction (HCC) 2005   NSTEMI, s/p LAD stent   NEPHROLITHIASIS, HX OF 06/07/2008   STEMI (ST elevation myocardial infarction) (HCC) 04/27/2019    Medications:  Scheduled:   atorvastatin  80 mg Oral Daily   ezetimibe  10 mg Oral Daily   gabapentin  300 mg Oral TID   losartan  12.5 mg Oral Daily   metoprolol succinate  12.5 mg Oral Daily   pantoprazole  40 mg Oral BID   potassium chloride SA  20 mEq Oral Daily   sertraline  50 mg Oral Daily   sodium chloride flush  3 mL Intravenous Q12H   tamsulosin  0.4 mg Oral Daily    Assessment: 67 yom with known history of HM3 LVAD 03/2021 - on warfarin PTA.  INR was 2 yesterday on admit, warfarin was held and now INR down to 1.8. Hgb 10.7, plt  178. LDH 137. Underwent endoscopy 9/26 finding gastritis and single AVM treated with APC.    PTA regimen is 7.5 mg daily except 5 mg every Monday.  Goal of Therapy:  INR 2.5-3  due to clotting on AoV and possible coronary embolism 12/23  Monitor platelets by anticoagulation protocol: Yes   Plan:  Order warfarin 3 mg tonight (reduced dose from PTA regimen) Will monitor INR and follow GI recommendations  Thank you for allowing pharmacy to participate in this patient's care,  Sherron Monday, PharmD, BCCCP Clinical Pharmacist  Phone: [phone] 02/06/2023 12:36 PM  Please check AMION for all [facility] Pharmacy phone numbers After 10:00 PM, call Main Pharmacy [phone]

## 2023-02-06 NOTE — Transfer of Care (Signed)
Immediate Anesthesia Transfer of Care Note  Patient: Brandon [name]  Procedure(s) Performed: ESOPHAGOGASTRODUODENOSCOPY (EGD) WITH PROPOFOL HOT HEMOSTASIS (ARGON PLASMA COAGULATION/BICAP)  Patient Location: PACU  Anesthesia Type:MAC  Level of Consciousness: awake, alert , oriented, patient cooperative, and responds to stimulation  Airway & Oxygen Therapy: Patient Spontanous Breathing and Patient connected to face mask oxygen  Post-op Assessment: Report given to RN and Post -op Vital signs reviewed and stable  Post vital signs: Reviewed and stable  Last Vitals:  Vitals Value Taken Time  BP 90/75 02/06/23 1120  Temp 36.3 C 02/06/23 1110  Pulse 55 02/06/23 1121  Resp 15 02/06/23 1121  SpO2 96 % 02/06/23 1121  Vitals shown include unfiled device data.  Last Pain:  Vitals:   02/06/23 1110  TempSrc:   PainSc: 0-No pain         Complications: No notable events documented.

## 2023-02-06 NOTE — Anesthesia Preprocedure Evaluation (Addendum)
Anesthesia Evaluation  Patient identified by MRN, date of birth, ID band Patient awake    Reviewed: Allergy & Precautions, NPO status , Patient's Chart, lab work & pertinent test results  History of Anesthesia Complications Negative for: history of anesthetic complications  Airway Mallampati: II  TM Distance: >3 FB Neck ROM: Full    Dental  (+) Dental Advisory Given, Teeth Intact   Pulmonary COPD, former smoker   Pulmonary exam normal        Cardiovascular hypertension, Pt. on home beta blockers and Pt. on medications + CAD, + Past MI, + Cardiac Stents and +CHF  Normal cardiovascular exam+ Cardiac Defibrillator + Valvular Problems/Murmurs    '24 TTE - LVAD cannula in apex. Left ventricular ejection fraction, by estimation, is <20%. The left ventricle demonstrates global hypokinesis. The left ventricular internal cavity size was moderately dilated. Right ventricular systolic function is mildly reduced. Left atrial size was mildly dilated. Right atrial size was mildly dilated. Trivial mitral valve regurgitation. The aortic valve has been repaired/replaced.     Neuro/Psych  PSYCHIATRIC DISORDERS  Depression    negative neurological ROS     GI/Hepatic Neg liver ROS, hiatal hernia,,,  Endo/Other  negative endocrine ROS    Renal/GU negative Renal ROS     Musculoskeletal  (+) Arthritis ,   Ankylosing spondylitis    Abdominal   Peds  Hematology negative hematology ROS (+)   Anesthesia Other Findings   Reproductive/Obstetrics                             Anesthesia Physical Anesthesia Plan  ASA: 4  Anesthesia Plan: MAC   Post-op Pain Management:    Induction:   PONV Risk Score and Plan: 1 and Propofol infusion and Treatment may vary due to age or medical condition  Airway Management Planned: Nasal Cannula and Natural Airway  Additional Equipment: None  Intra-op Plan:    Post-operative Plan:   Informed Consent: I have reviewed the patients History and Physical, chart, labs and discussed the procedure including the risks, benefits and alternatives for the proposed anesthesia with the patient or authorized representative who has indicated his/her understanding and acceptance.       Plan Discussed with: CRNA and Anesthesiologist  Anesthesia Plan Comments:         Anesthesia Quick Evaluation

## 2023-02-06 NOTE — Interval H&P Note (Signed)
History and Physical Interval Note:  02/06/2023 10:11 AM  Brandon [name]  has presented today for surgery, with the diagnosis of Melena, Anemia.  The various methods of treatment have been discussed with the patient and family. After consideration of risks, benefits and other options for treatment, the patient has consented to  Procedure(s): ESOPHAGOGASTRODUODENOSCOPY (EGD) WITH PROPOFOL (N/A) as a surgical intervention.  The patient's history has been reviewed, patient examined, no change in status, stable for surgery.  I have reviewed the patient's chart and labs.  Questions were answered to the patient's satisfaction.     Imogene Burn

## 2023-02-06 NOTE — Anesthesia Postprocedure Evaluation (Signed)
Anesthesia Post Note  Patient: Brandon [name]  Procedure(s) Performed: ESOPHAGOGASTRODUODENOSCOPY (EGD) WITH PROPOFOL HOT HEMOSTASIS (ARGON PLASMA COAGULATION/BICAP)     Patient location during evaluation: PACU Anesthesia Type: MAC Level of consciousness: awake and alert Pain management: pain level controlled Vital Signs Assessment: post-procedure vital signs reviewed and stable Respiratory status: spontaneous breathing, nonlabored ventilation and respiratory function stable Cardiovascular status: stable and blood pressure returned to baseline Anesthetic complications: no   No notable events documented.  Last Vitals:  Vitals:   02/06/23 1200 02/06/23 1211  BP: (!) 89/68   Pulse:  61  Resp:    Temp:    SpO2:      Last Pain:  Vitals:   02/06/23 1130  TempSrc:   PainSc: 0-No pain                 Beryle Lathe

## 2023-02-06 NOTE — Progress Notes (Signed)
VAD Coordinator Procedure Note:   VAD Coordinator met patient in endoscopy. Pt undergoing EGD per Dr. Rossie Muskrat. Hemodynamics and VAD parameters monitored by myself and anesthesia throughout the procedure. Blood pressures were obtained with automatic cuff on left arm. BP maintained with Neo drip during case.    Time: Doppler Auto  BP Flow PI Power Speed  Pre-procedure:  1010  90/79 (85) 4.2 7.7 4.6 5800   1034  110/95 (102) 4.6 5.0 4.7 5800           Sedation Induction: 1040  97/72 (82) 4.7 4.9 4.8 5800   1045  80/65 (72) 4.2 8.2 4.8 5800   1100  74/58 (65) 3.6 9.3 4.8 5800           Recovery Area: 1115  90/75 (82) 4.8 538 4.9 5800   1130  86/76 (82) 4.1 7.8 4.6 5800    Patient Disposition: Patient tolerated the procedure well. VAD Coordinator accompanied and remained with patient in recovery area.   See Dr Derek Mound procedure note for full details. 1 duodenal AVM treated with APC. Will continue clear liquids today and if no further bleeding or drop in Hgb may advance tomorrow.   Transported pt back to [facility].   Alyce Pagan RN VAD Coordinator  Office: [phone]  24/7 Pager: [phone]

## 2023-02-06 NOTE — Plan of Care (Signed)
  Problem: Activity: Goal: Ability to return to baseline activity level will improve Outcome: Progressing   Problem: Education: Goal: Knowledge of General Education information will improve Description: Including pain rating scale, medication(s)/side effects and non-pharmacologic comfort measures Outcome: Progressing   Problem: Health Behavior/Discharge Planning: Goal: Ability to manage health-related needs will improve Outcome: Progressing   Problem: Clinical Measurements: Goal: Ability to maintain clinical measurements within normal limits will improve Outcome: Progressing   Problem: Activity: Goal: Risk for activity intolerance will decrease Outcome: Progressing   Problem: Nutrition: Goal: Adequate nutrition will be maintained Outcome: Progressing   Problem: Coping: Goal: Level of anxiety will decrease Outcome: Progressing   Problem: Pain Managment: Goal: General experience of comfort will improve Outcome: Progressing   Problem: Safety: Goal: Ability to remain free from injury will improve Outcome: Progressing

## 2023-02-06 NOTE — Consult Note (Signed)
Consultation  Referring Provider:   Dr. Gala Romney Primary Care Physician:  Deeann Saint, MD Primary Gastroenterologist: Dr. Adela Lank        Reason for Consultation: Melena            HPI:   Brandon [name] is a 67 y.o. male with a past medical history as listed below including CAD status post previous anterior MI, severe systolic heart failure status post LVAD 04/06/2021, severe aortic stenosis status post AVR and endocarditis, who presented to the hospital as a direct admission from the advanced heart failure care team for symptomatic anemia.    Apparently patient was feeling good until about a week ago and then started having some dark stool over the past week.  He had a hemoglobin in the LVAD clinic which had trended down from 12.4-10.7.  He was told to stop his Coumadin, INR 2.7.  Still on Aspirin.  Yesterday was complaining of lightheadedness, dyspnea with exertion and black stool.    Today, patient tells me that back in March he had an episode of some black stools for a few days but went to the heart failure clinic and was found to have a fairly normal hemoglobin around 13, was told to keep an eye on it and after he took some antibiotics for a tooth infection the black stools actually stopped.  He has seen none until about a week ago and is seeing at least 1 or 2 a day, some with a sticky tarry appearance.  Also started feeling lightheaded and dizzy.  Also noticed that he was more fatigued when doing his normal exercise routines.  Tells me that he had decreased his Pantoprazole to 40 mg once a day because he picked up an old prescription which had this written on the bottle and was not sure how he should be taking it.    He did have some clears around 8:00 this morning but has not eaten or drank anything since then.    Denies fever, chills, weight loss, abdominal pain, nausea or vomiting.  Cardiac history per Dr. Prescott Gum note: Admitted for cardiogenic shock in 02/2021.  He  underwent placement of HM3 VAD and aortic valve replacement with 25 mm Edwards Inspiris Resilia valve on 04/06/21. Surgical path came back 11/28 with evidence of possible "acute aortic valve endocarditis". 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    Admitted 04/2022 with ventricular fibrillation.  Was defibrillated.  Then on Christmas day he presented back with chest pain, nausea, and shortness of breath.  ECG showed ST elevation.  Catheterization showed stable CAD. Cardiac CT suspicious for clot on AoV. INR increased to 2.5-3.0 range.    Had PET 1/24 which showed increased size of the spiculated LUL pulmonary nodule which is hypermetabolic concerning for primary bronchogenic carcinoma. Has seen Dr. Dorris Fetch and referred for stereotactic radiation without biopsy. Completed radiation.   GI history: 04/24/2020 colonoscopy with Dr. Adela Lank with one 8 mm polyp in the cecum, two 3-6 mm polyps in the ascending colon, 115 mm polyp in the transverse colon, diverticulosis in the sigmoid colon internal hemorrhoids, repeat recommended in 3 years  Past Medical History:  Diagnosis Date   AICD (automatic cardioverter/defibrillator) present 08/31/2019   Ankylosing spondylitis (HCC)    Arthritis    BENIGN PROSTATIC HYPERTROPHY 06/07/2008   CHF (congestive heart failure) (HCC)    COLONIC POLYPS, HX OF 06/07/2008   Coronary artery disease  Depression    H/O hiatal hernia    Heart failure (HCC)    History of radiation therapy    Left Lung- 08/06/22-08/16/22- Dr. Antony Blackbird   HYPERLIPIDEMIA 06/07/2008   HYPERTENSION 06/07/2008   Myocardial infarction [facility]) 2005   NSTEMI, s/p LAD stent   NEPHROLITHIASIS, HX OF 06/07/2008   STEMI (ST elevation myocardial infarction) (HCC) 04/27/2019    Past Surgical History:  Procedure Laterality Date   AORTIC VALVE REPLACEMENT N/A 04/06/2021   Procedure: AORTIC VALVE REPLACEMENT (AVR) WITH INSPIRIS  RESILIA  AORTIC VALVE SIZE ;  Surgeon: Alleen Borne, MD;  Location: [facility] OR;  Service: Open Heart Surgery;  Laterality: N/A;   COLONOSCOPY WITH PROPOFOL N/A 04/24/2020   Procedure: COLONOSCOPY WITH PROPOFOL;  Surgeon: Benancio Deeds, MD;  Location: [facility] END[facility]COPY;  Service: Gastroenterology;  Laterality: N/A;   CORONARY ANGIOPLASTY WITH STENT PLACEMENT  2005   LAD stent, jailed diagonal   CORONARY/GRAFT ACUTE MI REVASCULARIZATION N/A 04/27/2019   Procedure: Coronary/Graft Acute MI Revascularization;  Surgeon: Corky Crafts, MD;  Location: [facility] INVASIVE CV LAB;  Service: Cardiovascular;  Laterality: N/A;   ICD IMPLANT  08/31/2019   ICD IMPLANT N/A 08/31/2019   Procedure: ICD IMPLANT;  Surgeon: Hillis Range, MD;  Location: [facility] INVASIVE CV LAB;  Service: Cardiovascular;  Laterality: N/A;   ICD LEAD REMOVAL N/A 12/04/2020   Procedure: ICD SYSTEM EXTRACTION;  Surgeon: Lanier Prude, MD;  Location: [facility] OR;  Service: Cardiovascular;  Laterality: N/A;   INSERTION OF IMPLANTABLE LEFT VENTRICULAR ASSIST DEVICE N/A 04/06/2021   Procedure: INSERTION OF IMPLANTABLE LEFT VENTRICULAR ASSIST DEVICE;  Surgeon: Alleen Borne, MD;  Location: [facility] OR;  Service: Open Heart Surgery;  Laterality: N/A;  HM3   IR THORACENTESIS ASP PLEURAL SPACE W/IMG GUIDE  04/19/2021   IR THORACENTESIS ASP PLEURAL SPACE W/IMG GUIDE  04/20/2021   IR THORACENTESIS ASP PLEURAL SPACE W/IMG GUIDE  04/23/2021   LAMINECTOMY     lumbar   LEFT HEART CATH AND CORONARY ANGIOGRAPHY N/A 04/27/2019   Procedure: LEFT HEART CATH AND CORONARY ANGIOGRAPHY;  Surgeon: Corky Crafts, MD;  Location: [facility] INVASIVE CV LAB;  Service: Cardiovascular;  Laterality: N/A;   LUMBAR FUSION  01/2012   POLYPECTOMY  04/24/2020   Procedure: POLYPECTOMY;  Surgeon: Benancio Deeds, MD;  Location: [facility]COPY;  Service: Gastroenterology;;   RIGHT HEART CATH N/A 04/27/2019   Procedure: RIGHT HEART CATH;  Surgeon: Swazil[place], Curtez M, MD;  Location: [facility]  INVASIVE CV LAB;  Service: Cardiovascular;  Laterality: N/A;   RIGHT HEART CATH N/A 03/28/2021   Procedure: RIGHT HEART CATH;  Surgeon: Dolores Patty, MD;  Location: [facility] INVASIVE CV LAB;  Service: Cardiovascular;  Laterality: N/A;   RIGHT HEART CATH AND CORONARY ANGIOGRAPHY N/A 05/06/2022   Procedure: RIGHT HEART CATH AND CORONARY ANGIOGRAPHY;  Surgeon: Dolores Patty, MD;  Location: [facility] INVASIVE CV LAB;  Service: Cardiovascular;  Laterality: N/A;   RIGHT/LEFT HEART CATH AND CORONARY ANGIOGRAPHY N/A 11/29/2020   Procedure: RIGHT/LEFT HEART CATH AND CORONARY ANGIOGRAPHY;  Surgeon: Dolores Patty, MD;  Location: [facility] INVASIVE CV LAB;  Service: Cardiovascular;  Laterality: N/A;   TEE WITHOUT CARDIOVERSION N/A 12/04/2020   Procedure: TRANSESOPHAGEAL ECHOCARDIOGRAM (TEE);  Surgeon: Lanier Prude, MD;  Location: [facility] OR;  Service: Cardiovascular;  Laterality: N/A;   TEE WITHOUT CARDIOVERSION N/A 04/06/2021   Procedure: TRANSESOPHAGEAL ECHOCARDIOGRAM (TEE);  Surgeon: Alleen Borne, MD;  Location: [facility] OR;  Service: Open Heart Surgery;  Laterality: N/A;  TONSILLECTOMY     warthin tumor removal Left 2014    Family History  Problem Relation Age of Onset   Depression Father    Arthritis Sister        RA   Heart failure Mother    Other Neg Hx    Colon cancer Neg Hx    Esophageal cancer Neg Hx    Rectal cancer Neg Hx    Stomach cancer Neg Hx    Colon polyps Neg Hx     Social History   Tobacco Use   Smoking status: Former    Current packs/day: 0.00    Average packs/day: 1 pack/day for 40.0 years (40.0 ttl pk-yrs)    Types: Cigarettes    Start date: 07/26/1979    Quit date: 07/26/2019    Years since quitting: 3.5   Smokeless tobacco: Never   Tobacco comments:    1-1.5 pks per year x 40-45 yrs  Vaping Use   Vaping status: Some Days   Substances: CBD  Substance Use Topics   Alcohol use: Yes    Alcohol/week: 7.0 standard drinks of alcohol    Types: 7 Shots of liquor per week     Comment: "2 vodka tonics a night"   Drug use: Yes    Comment: occasionally - CBD oil    Prior to Admission medications   Medication Sig Start Date End Date Taking? Authorizing Provider  acetaminophen (TYLENOL) 500 MG tablet Take 500-1,000 mg by mouth every 6 (six) hours as needed (for pain).   Yes [provider]  aspirin EC 81 MG tablet Take 1 tablet (81 mg total) by mouth daily. Swallow whole. 05/12/22  Yes Dunn, Dayna N, PA-C  atorvastatin (LIPITOR) 80 MG tablet PATIENT MUST ATTEND ANNUAL APPOINTMENT FOR FUTURE REFILLS TAKE 1 TABLET BY MOUTH ONCE DAILY AT 6PM. Patient taking differently: Take 80 mg by mouth every evening. 10/02/22  Yes Crenshaw, Madolyn Frieze, MD  Carboxymethylcellulose Sodium (ARTIFICIAL TEARS OP) Place 1 drop into both eyes daily as needed (for dryness or irritation).   Yes [provider]  eplerenone (INSPRA) 50 MG tablet Take 1 tablet (50 mg total) by mouth in the morning. 08/28/21  Yes Bensimhon, Bevelyn Buckles, MD  ezetimibe (ZETIA) 10 MG tablet TAKE ONE TABLET BY MOUTH DAILY 07/22/22  Yes Bensimhon, Bevelyn Buckles, MD  gabapentin (NEURONTIN) 300 MG capsule Take 1 capsule (300 mg total) by mouth 3 (three) times daily. 12/30/22  Yes Bensimhon, Bevelyn Buckles, MD  KLOR-CON M20 20 MEQ tablet TAKE 1 TABLET BY MOUTH EVERY DAY 09/16/22  Yes Laurey Morale, MD  losartan (COZAAR) 25 MG tablet Take 0.5 tablets (12.5 mg total) by mouth daily. 03/29/22  Yes Robbie Lis M, PA-C  metoprolol succinate (TOPROL-XL) 25 MG 24 hr tablet TAKE 1/2 TABLET BY MOUTH EVERY DAY 10/09/22  Yes Juanda Crumble K, PA-C  nystatin (MYCOSTATIN/NYSTOP) powder Apply 1 Application topically 3 (three) times daily. Patient taking differently: Apply 1 Application topically 3 (three) times daily as needed (For dry skin). 10/10/22  Yes Bensimhon, Bevelyn Buckles, MD  pantoprazole (PROTONIX) 40 MG tablet Take 1 tablet (40 mg total) by mouth 2 (two) times daily. 08/22/22  Yes Bensimhon, Bevelyn Buckles, MD  sertraline  (ZOLOFT) 50 MG tablet TAKE 1/2 TAB BY MOUTH DAILY FOR 1 WEEK, THEN INCREASE TO 1 TABLET DAILY. Patient taking differently: Take 50 mg by mouth daily. Pt taking 50 mg daily 06/26/22  Yes Bensimhon, Bevelyn Buckles, MD  tamsulosin (FLOMAX) 0.4 MG CAPS capsule  Take 2 capsules (0.8 mg total) by mouth daily. Patient taking differently: Take 0.4 mg by mouth daily. 10/25/22  Yes Deeann Saint, MD  traMADol (ULTRAM) 50 MG tablet TAKE 1-2 TABLETS BY MOUTH TWICE A DAY FOR 30 DAYS Patient taking differently: Take 50 mg by mouth every 6 (six) hours as needed for moderate pain. 10/25/22  Yes Deeann Saint, MD  warfarin (COUMADIN) 2.5 MG tablet TAKE 7.5 MG (3 TABLETS) EVERY MONDAY/FRIDAY AND 5 MG (2 TABLETS) ALL OTHER DAYS OR AS INSTRUCTED BY LVAD CLINIC. Patient taking differently: Take two of the 2.5 mg tablets for total of 5mg  on Mondays and all other days take 3 of the 2.5 mg tablets for total of 7.5mg  per patient 12/25/22  Yes Bensimhon, Bevelyn Buckles, MD  ZYRTEC ALLERGY 10 MG tablet Take 10 mg by mouth daily.   Yes [provider]  torsemide (DEMADEX) 20 MG tablet Take 1 tablet (20 mg total) by mouth as needed. Patient not taking: Reported on 12/23/2022 02/20/22   Bensimhon, Bevelyn Buckles, MD    Current Facility-Administered Medications  Medication Dose Route Frequency Provider Last Rate Last Admin   0.9 %  sodium chloride infusion  250 mL Intravenous PRN Clegg, Amy D, NP       acetaminophen (TYLENOL) tablet 650 mg  650 mg Oral Q4H PRN Clegg, Amy D, NP       ezetimibe (ZETIA) tablet 10 mg  10 mg Oral Daily Clegg, Amy D, NP       gabapentin (NEURONTIN) capsule 300 mg  300 mg Oral TID Clegg, Amy D, NP   300 mg at 02/05/23 1922   losartan (COZAAR) tablet 12.5 mg  12.5 mg Oral Daily Clegg, Amy D, NP       metoprolol succinate (TOPROL-XL) 24 hr tablet 12.5 mg  12.5 mg Oral Daily Clegg, Amy D, NP       ondansetron (ZOFRAN) injection 4 mg  4 mg Intravenous Q6H PRN Clegg, Amy D, NP       pantoprazole (PROTONIX) EC  tablet 40 mg  40 mg Oral BID Clegg, Amy D, NP   40 mg at 02/05/23 2112   potassium chloride SA (KLOR-CON M) CR tablet 20 mEq  20 mEq Oral Daily Clegg, Amy D, NP       sertraline (ZOLOFT) tablet 50 mg  50 mg Oral Daily Clegg, Amy D, NP       sodium chloride flush (NS) 0.9 % injection 3 mL  3 mL Intravenous Q12H Clegg, Amy D, NP   3 mL at 02/05/23 2112   tamsulosin (FLOMAX) capsule 0.4 mg  0.4 mg Oral Daily Clegg, Amy D, NP        Allergies as of 02/05/2023   (No Known Allergies)     Review of Systems:    Constitutional: No weight loss, fever or chills Skin: No rash  Cardiovascular: No chest pain  Respiratory: No SOB  Gastrointestinal: See HPI and otherwise negative Genitourinary: No dysuria  Neurological: No headache Musculoskeletal: No new muscle or joint pain Hematologic: No bruising Psychiatric: No history of depression or anxiety    Physical Exam:  Vital signs in last 24 hours: Temp:  [97.5 F (36.4 C)-98 F (36.7 C)] 97.5 F (36.4 C) (09/26 0802) Pulse Rate:  [70-100] 98 (09/26 0802) Resp:  [13-16] 16 (09/26 0802) BP: (95-122)/(69-95) 100/83 (09/26 0802) SpO2:  [92 %-98 %] 98 % (09/26 0749) Weight:  [90.6 kg-92 kg] 90.6 kg (09/26 0427) Last BM Date :  02/05/23 (pt reports tarry stool) General:   Pleasant Caucasian male appears to be in NAD, Well developed, Well nourished, alert and cooperative Head:  Normocephalic and atraumatic. Eyes:   PEERL, EOMI. No icterus. Conjunctiva pink. Ears:  Normal auditory acuity. Neck:  Supple Throat: Oral cavity and pharynx without inflammation, swelling or lesion. Teeth in good condition. Lungs: Respirations even and unlabored. Lungs clear to auscultation bilaterally.   No wheezes, crackles, or rhonchi.  Heart:+LVAD hum, No peripheral edema, cyanosis or pallor.  Abdomen:  Soft, nondistended, nontender. No rebound or guarding. +LVAD hum, No appreciable masses or hepatomegaly. Rectal:  Not performed.  Msk:  Symmetrical without gross  deformities. Peripheral pulses intact.  Extremities:  Without edema, no deformity or joint abnormality. Normal ROM, normal sensation. Neurologic:  Alert and  oriented x4;  grossly normal neurologically. Skin:   Dry and intact without significant lesions or rashes. Psychiatric: Demonstrates good judgement and reason without abnormal affect or behaviors.   LAB RESULTS: Recent Labs    02/04/23 0852 02/05/23 1819 02/06/23 0234  WBC 6.9 6.8 6.5  HGB 10.7* 10.3* 10.7*  HCT 35.0* 33.1* 34.4*  PLT 180 180 178   BMET Recent Labs    02/05/23 1819 02/06/23 0234  NA 135 138  K 4.2 4.0  CL 106 104  CO2 24 27  GLUCOSE 88 95  BUN 16 12  CREATININE 0.94 0.91  CALCIUM 8.7* 8.7*   PT/INR Recent Labs    02/05/23 1819 02/06/23 0234  LABPROT 22.7* 20.9*  INR 2.0* 1.8*     Impression / Plan:   Impression: 1.  Symptomatic anemia: With history of LVAD placement and now melena x 7 days hemoglobin 12--> 10.7, Coumadin held as of 9/24, INR 1.8 this morning, aspirin held as of yesterday; suspect upper GI bleed 2.  Chronic heart failure: Echo 12/30/2022 with LVEF less than 20%, status post LVAD 04/06/2021 3.  Chronic anticoagulation with Coumadin: Held as of 9/24, INR 1.8 this morning 4.  CAD: Status post anterior STEMI 12/20 5.  Lung cancer: Completed radiation  Plan: 1.  Agree with Pantoprazole 40 mg IV twice daily 2.  Continue to monitor hemoglobin with transfusion as needed less than 7 3.  Patient will be n.p.o. now.  Plans for EGD this morning, patient's last clears were around 8:00 this morning.  Will plan for EGD after 10:00 this morning with Dr. [name].  Did discuss risks, benefits, limitations and alternatives and patient agrees to proceed. 4.  Please await further recommendations from Dr. [name] after time of procedure.  Thank you for your kind consultation, we will continue to follow.  Violet Baldy [facility]  02/06/2023, 8:45 AM

## 2023-02-06 NOTE — Op Note (Addendum)
[facility] Patient Name: Brandon [name] Procedure Date : 02/06/2023 MRN: [id number] Attending MD: Particia Lather , , [id number] Date of Birth: 09/27/1955 CSN: [id number] Age: 67 Admit Type: Inpatient Procedure:                Upper GI endoscopy Indications:              Melena, Anemia Providers:                Madelyn Brunner" Daylene Posey, RN, Suzy Bouchard, RN, Melany Guernsey, Technician Referring MD:             Bevelyn Buckles. Bensimhom, MD Medicines:                Monitored Anesthesia Care Complications:            No immediate complications. Estimated Blood Loss:     Estimated blood loss was minimal. Procedure:                Pre-Anesthesia Assessment:                           - Prior to the procedure, a History and Physical                            was performed, and patient medications and                            allergies were reviewed. The patient's tolerance of                            previous anesthesia was also reviewed. The risks                            and benefits of the procedure and the sedation                            options and risks were discussed with the patient.                            All questions were answered, and informed consent                            was obtained. Prior Anticoagulants: The patient has                            taken Coumadin (warfarin), last dose was 2 days                            prior to procedure. ASA Grade Assessment: III - A                            patient with severe systemic disease. After  reviewing the risks and benefits, the patient was                            deemed in satisfactory condition to undergo the                            procedure.                           After obtaining informed consent, the endoscope was                            passed under direct vision. Throughout the                             procedure, the patient's blood pressure, pulse, and                            oxygen saturations were monitored continuously. The                            [id number]-[id number] ([id number]) Olympus endoscope was introduced                            through the mouth, and advanced to the third part                            of duodenum. The upper GI endoscopy was                            accomplished without difficulty. The patient                            tolerated the procedure well. Scope In: Scope Out: Findings:      The examined esophagus was normal.      Localized inflammation characterized by congestion (edema), erythema and       granularity was found in the gastric antrum. Possibly a healing ulcer in       the stomach.      A single 2 mm angioectasia without bleeding was found in the second       portion of the duodenum. Coagulation for bleeding prevention using argon       plasma at 0.8 liters/minute and 20 watts was successful. Impression:               - Normal esophagus.                           - Gastritis.                           - A single non-bleeding angioectasia in the                            duodenum. Treated with argon plasma coagulation                            (  APC).                           - No specimens collected. Recommendation:           - Return patient to ICU for ongoing care.                           - Continue PPI BID.                           - Will check H pylori serum antibody.                           - Continue to trend hemoglobin and monitor for                            continued signs of bleeding.                           - If any signs of continued bleeding, then could                            consider SBE versus VCE for further evaluation.                           - CLD for today. If no signs of bleeding by                            tomorrow, then can advance.                           - The findings and recommendations were  discussed                            with the patient. Procedure Code(s):        --- Professional ---                           [phone], Esophagogastroduodenoscopy, flexible,                            transoral; with control of bleeding, any method Diagnosis Code(s):        --- Professional ---                           K29.70, Gastritis, unspecified, without bleeding                           K31.819, Angiodysplasia of stomach and duodenum                            without bleeding                           K92.1, Melena (includes Hematochezia)  D64.9, Anemia, unspecified CPT copyright 2022 [employer]. All rights reserved. The codes documented in this report are preliminary and upon coder review may  be revised to meet current compliance requirements. Dr Particia Lather "Brandon [name]" Brandon [name],  02/06/2023 11:12:27 AM Number of Addenda: 0

## 2023-02-06 NOTE — Progress Notes (Addendum)
Advanced Heart Failure VAD Team Note  PCP-Cardiologist: Olga Millers, MD   Subjective:    9/25: admitted w/ symptomatic anemia and GIB. Hgb 10  Feels ok this morning. No BM since admission. Hgb stable 10.7. MAPs 70-80s. INR 1.8   No resting dyspnea. Mildly SOB w/ activity.   LVAD INTERROGATION:  HeartMate III LVAD:   Flow 4.7 liters/min, speed 5800, power 4.8, PI 4.2.  Numerous PI events   Objective:    Vital Signs:   Temp:  [97.5 F (36.4 C)-98 F (36.7 C)] 97.5 F (36.4 C) (09/26 0427) Pulse Rate:  [70-100] 70 (09/26 0427) Resp:  [14-16] 16 (09/26 0427) BP: (95-122)/(69-95) 107/95 (09/26 0427) SpO2:  [92 %-96 %] 96 % (09/26 0427) Weight:  [90.6 kg-92 kg] 90.6 kg (09/26 0427) Last BM Date : 02/05/23 (pt reports tarry stool) Mean arterial Pressure 70s-80s   Intake/Output:   Intake/Output Summary (Last 24 hours) at 02/06/2023 0735 Last data filed at 02/06/2023 0427 Gross per 24 hour  Intake 600 ml  Output 3300 ml  Net -2700 ml     Physical Exam    General:  Well appearing. No resp difficulty HEENT: normal Neck: supple. JVP not elevated . Carotids 2+ bilat; no bruits. No lymphadenopathy or thyromegaly appreciated. Cor: Mechanical heart sounds with LVAD hum present. Lungs: clear Abdomen: soft, nontender, nondistended. No hepatosplenomegaly. No bruits or masses. Good bowel sounds. Driveline: C/D/I; securement device intact and driveline incorporated Extremities: no cyanosis, clubbing, rash, edema Neuro: alert & orientedx3, cranial nerves grossly intact. moves all 4 extremities w/o difficulty. Affect pleasant   Telemetry   +barostim artifact, regular 60s. Several brief funs of NSVT and PVCs   EKG    N/A  Labs   Basic Metabolic Panel: Recent Labs  Lab 02/05/23 1819 02/06/23 0234  NA 135 138  K 4.2 4.0  CL 106 104  CO2 24 27  GLUCOSE 88 95  BUN 16 12  CREATININE 0.94 0.91  CALCIUM 8.7* 8.7*    Liver Function Tests: No results for input(s):  "AST", "ALT", "ALKPHOS", "BILITOT", "PROT", "ALBUMIN" in the last 168 hours. No results for input(s): "LIPASE", "AMYLASE" in the last 168 hours. No results for input(s): "AMMONIA" in the last 168 hours.  CBC: Recent Labs  Lab 02/04/23 0852 02/05/23 1819 02/06/23 0234  WBC 6.9 6.8 6.5  HGB 10.7* 10.3* 10.7*  HCT 35.0* 33.1* 34.4*  MCV 82.0 81.5 81.3  PLT 180 180 178    INR: Recent Labs  Lab 02/04/23 0852 02/05/23 1819 02/06/23 0234  INR 2.7* 2.0* 1.8*    Other results: EKG:    Imaging   No results found.   Medications:     Scheduled Medications:  ezetimibe  10 mg Oral Daily   gabapentin  300 mg Oral TID   losartan  12.5 mg Oral Daily   metoprolol succinate  12.5 mg Oral Daily   pantoprazole  40 mg Oral BID   potassium chloride SA  20 mEq Oral Daily   sertraline  50 mg Oral Daily   sodium chloride flush  3 mL Intravenous Q12H   tamsulosin  0.4 mg Oral Daily    Infusions:  sodium chloride      PRN Medications: sodium chloride, acetaminophen, ondansetron (ZOFRAN) IV   Patient Profile   Brandon [name] is a 67 year old with a history of  tobacco use, CAD s/p previous anterior MI, severe systolic HF s/p HM-3 VAD 04/06/21, barostim, severe AS s/p AVR, endocarditis.  Admitted with symptomatic anemia.  Assessment/Plan:    1. Symptomatic Anemia  -Melena x 7 days and started developed concerning symptom. Lightheaded. No BM overnight  -Hgb 12>10.7. Hgb stable today at 10.7  -Continue Protonix 40 bid  -Consult GI.   2. Chronic HFrEF, ICU  -Echo 05/19/20 EF 20-25% RV mildly HK. moderate AS  Mean gradient 13 AVA 1.2 cm2 DI 0.30. -Echo 12/30/22: LVEF < 20% LVIDd 6.1cm. Cannula well placed. RV mildly HK. AoV not opening. No AI.  - s/p HM-III VAD + bioprosthetic AVR 04/06/21 - Volume status stable. Can use torsemide as needed.  - Continue current dose to Toprol XL and Losartan.    3. HMIII LVAD, DT  -Hold ASA and coumadin.  -INR 1.8. LDH 137    4.  VT/VF -Most recent 12/20223   5. CAD  - s/p anterior STEMI (12/20). LHC showed chronically occluded RCA (with L>>R collaterals) and thrombotic occlusion of proximal LAD. Underwent PCI of LAD. - Recurrent anterior STEMI in 12/23 Cath 12/23 LAD stent patent. Felt to be embolic from aortic valve - Continue atorvastatin 80 mg daily    6. PAF - He has been off amio.    7. Lung Cancer Completed radiation    I reviewed the LVAD parameters from today, and compared the results to the patient's prior recorded data.  No programming changes were made.  The LVAD is functioning within specified parameters.  The patient performs LVAD self-test daily.  LVAD interrogation was negative for any significant power changes, alarms or PI events/speed drops.  LVAD equipment check completed and is in good working order.  Back-up equipment present.   LVAD education done on emergency procedures and precautions and reviewed exit site care.  Length of Stay: 1  Brandon [name] 02/06/2023, 7:35 AM  VAD Team --- VAD ISSUES ONLY--- Pager [phone] (7am - 7am)  Advanced Heart Failure Team  Pager [phone] (M-F; 7a - 5p)  Please contact [facility] for night-coverage after hours (5p -7a ) and weekends on [email or web address]  Patient seen and examined with the above-signed Advanced Practice Provider and/or Housestaff. I personally reviewed laboratory data, imaging studies and relevant notes. I independently examined the patient and formulated the important aspects of the plan. I have edited the note to reflect any of my changes or salient points. I have personally discussed the plan with the patient and/or family.   Denies further melena. 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 Otherwise feels ok.   General:  NAD.  HEENT: normal  Neck: supple. JVP not elevated.  Carotids 2+ bilat; no bruits. No lymphadenopathy or thryomegaly appreciated. Cor: LVAD hum.  Lungs: Clear. Abdomen: soft, nontender,  non-distended. No hepatosplenomegaly. No bruits or masses. Good bowel sounds. Driveline site clean. Anchor in place.  Extremities: no cyanosis, clubbing, rash. Warm no edema  Neuro: alert & oriented x 3. No focal deficits. Moves all 4 without problem   Now s/p APC treatment of AVM. Will restart low-dose warfarin and follow. (D/w GI personally).   VAD interrogated personally. Parameters stable.  MAPs ok.   Brandon Meres, MD  2:31 PM

## 2023-02-06 NOTE — Discharge Summary (Signed)
bedside.    5. CAD  - s/p anterior STEMI (12/20). LHC showed chronically occluded RCA (with L>>R collaterals) and thrombotic occlusion of proximal LAD. Underwent PCI of LAD. - Recurrent anterior STEMI in 12/23 Cath 12/23 LAD stent patent. Felt to be embolic from aortic valve - Continue atorvastatin 80 mg daily    6. PAF - He has been off amio. Maintaining SR.    7. Lung Cancer Completed radiation  Discharge Weight Range: ~91kg Discharge Vitals: Blood pressure 98/72, pulse 72,  temperature 97.7 F (36.5 C), temperature source Oral, resp. rate 16, height 6' (1.829 m), weight 91.7 kg, SpO2 98%.  Labs: Lab Results  Component Value Date   WBC 7.1 02/10/2023   HGB 9.9 (L) 02/10/2023   HCT 31.3 (L) 02/10/2023   MCV 83.5 02/10/2023   PLT 153 02/10/2023    Recent Labs  Lab 02/10/23 0313  NA 138  K 3.9  CL 106  CO2 26  BUN 11  CREATININE 0.89  CALCIUM 8.5*  GLUCOSE 107*   Lab Results  Component Value Date   CHOL 121 10/08/2022   HDL 46 10/08/2022   LDLCALC 59 10/08/2022   TRIG 83 10/08/2022   BNP (last 3 results) No results for input(s): "BNP" in the last 8760 hours.  ProBNP (last 3 results) No results for input(s): "PROBNP" in the last 8760 hours.   Diagnostic Studies/Procedures   ECHOCARDIOGRAM LIMITED  Result Date: 02/10/2023    ECHOCARDIOGRAM LIMITED REPORT   Patient Name:   Brandon [name] Date of Exam: 02/10/2023 Medical Rec #:  [id number]      Height:       72.0 in Accession #:    [id number]     Weight:       202.2 lb Date of Birth:  11-09-1955       BSA:          2.140 m Patient Age:    67 years       BP:           -/- mmHg Patient Gender: M              HR:           79 bpm. Exam Location:  Inpatient Procedure: Limited Echo, Color Doppler and Cardiac Doppler Indications:    LVAD Evaluation  History:        Patient has prior history of Echocardiogram examinations, most                 recent 12/30/2022. CHF, CAD, Defibrillator; Risk                 Factors:Hypertension and Dyslipidemia.                 Aortic Valve: 25 mm Edwards bioprosthetic valve is present in                 the aortic position.  Sonographer:    Irving Burton Senior RDCS Referring Phys: [id number] Dorthula Nettles  Sonographer Comments: Heart Mate III speed 5800 IMPRESSIONS  1. LVIDd 6.03cm, ventricular septum is midline. Left ventricular ejection fraction, by estimation, is <20%. The left ventricle has severely decreased function. The left ventricle demonstrates global hypokinesis. The left  ventricular internal cavity size  was moderately dilated. The LVAD cannuyla is seen in the apex  2. RVOT VTI is 13.1cm. Right ventricular systolic function is mildly reduced. The interventricular septum is midline  3. Trivial mitral valve regurgitation. Moderate mitral annular  bedside.    5. CAD  - s/p anterior STEMI (12/20). LHC showed chronically occluded RCA (with L>>R collaterals) and thrombotic occlusion of proximal LAD. Underwent PCI of LAD. - Recurrent anterior STEMI in 12/23 Cath 12/23 LAD stent patent. Felt to be embolic from aortic valve - Continue atorvastatin 80 mg daily    6. PAF - He has been off amio. Maintaining SR.    7. Lung Cancer Completed radiation  Discharge Weight Range: ~91kg Discharge Vitals: Blood pressure 98/72, pulse 72,  temperature 97.7 F (36.5 C), temperature source Oral, resp. rate 16, height 6' (1.829 m), weight 91.7 kg, SpO2 98%.  Labs: Lab Results  Component Value Date   WBC 7.1 02/10/2023   HGB 9.9 (L) 02/10/2023   HCT 31.3 (L) 02/10/2023   MCV 83.5 02/10/2023   PLT 153 02/10/2023    Recent Labs  Lab 02/10/23 0313  NA 138  K 3.9  CL 106  CO2 26  BUN 11  CREATININE 0.89  CALCIUM 8.5*  GLUCOSE 107*   Lab Results  Component Value Date   CHOL 121 10/08/2022   HDL 46 10/08/2022   LDLCALC 59 10/08/2022   TRIG 83 10/08/2022   BNP (last 3 results) No results for input(s): "BNP" in the last 8760 hours.  ProBNP (last 3 results) No results for input(s): "PROBNP" in the last 8760 hours.   Diagnostic Studies/Procedures   ECHOCARDIOGRAM LIMITED  Result Date: 02/10/2023    ECHOCARDIOGRAM LIMITED REPORT   Patient Name:   Brandon [name] Date of Exam: 02/10/2023 Medical Rec #:  [id number]      Height:       72.0 in Accession #:    [id number]     Weight:       202.2 lb Date of Birth:  11-09-1955       BSA:          2.140 m Patient Age:    67 years       BP:           -/- mmHg Patient Gender: M              HR:           79 bpm. Exam Location:  Inpatient Procedure: Limited Echo, Color Doppler and Cardiac Doppler Indications:    LVAD Evaluation  History:        Patient has prior history of Echocardiogram examinations, most                 recent 12/30/2022. CHF, CAD, Defibrillator; Risk                 Factors:Hypertension and Dyslipidemia.                 Aortic Valve: 25 mm Edwards bioprosthetic valve is present in                 the aortic position.  Sonographer:    Irving Burton Senior RDCS Referring Phys: [id number] Dorthula Nettles  Sonographer Comments: Heart Mate III speed 5800 IMPRESSIONS  1. LVIDd 6.03cm, ventricular septum is midline. Left ventricular ejection fraction, by estimation, is <20%. The left ventricle has severely decreased function. The left ventricle demonstrates global hypokinesis. The left  ventricular internal cavity size  was moderately dilated. The LVAD cannuyla is seen in the apex  2. RVOT VTI is 13.1cm. Right ventricular systolic function is mildly reduced. The interventricular septum is midline  3. Trivial mitral valve regurgitation. Moderate mitral annular  bedside.    5. CAD  - s/p anterior STEMI (12/20). LHC showed chronically occluded RCA (with L>>R collaterals) and thrombotic occlusion of proximal LAD. Underwent PCI of LAD. - Recurrent anterior STEMI in 12/23 Cath 12/23 LAD stent patent. Felt to be embolic from aortic valve - Continue atorvastatin 80 mg daily    6. PAF - He has been off amio. Maintaining SR.    7. Lung Cancer Completed radiation  Discharge Weight Range: ~91kg Discharge Vitals: Blood pressure 98/72, pulse 72,  temperature 97.7 F (36.5 C), temperature source Oral, resp. rate 16, height 6' (1.829 m), weight 91.7 kg, SpO2 98%.  Labs: Lab Results  Component Value Date   WBC 7.1 02/10/2023   HGB 9.9 (L) 02/10/2023   HCT 31.3 (L) 02/10/2023   MCV 83.5 02/10/2023   PLT 153 02/10/2023    Recent Labs  Lab 02/10/23 0313  NA 138  K 3.9  CL 106  CO2 26  BUN 11  CREATININE 0.89  CALCIUM 8.5*  GLUCOSE 107*   Lab Results  Component Value Date   CHOL 121 10/08/2022   HDL 46 10/08/2022   LDLCALC 59 10/08/2022   TRIG 83 10/08/2022   BNP (last 3 results) No results for input(s): "BNP" in the last 8760 hours.  ProBNP (last 3 results) No results for input(s): "PROBNP" in the last 8760 hours.   Diagnostic Studies/Procedures   ECHOCARDIOGRAM LIMITED  Result Date: 02/10/2023    ECHOCARDIOGRAM LIMITED REPORT   Patient Name:   Brandon [name] Date of Exam: 02/10/2023 Medical Rec #:  [id number]      Height:       72.0 in Accession #:    [id number]     Weight:       202.2 lb Date of Birth:  11-09-1955       BSA:          2.140 m Patient Age:    67 years       BP:           -/- mmHg Patient Gender: M              HR:           79 bpm. Exam Location:  Inpatient Procedure: Limited Echo, Color Doppler and Cardiac Doppler Indications:    LVAD Evaluation  History:        Patient has prior history of Echocardiogram examinations, most                 recent 12/30/2022. CHF, CAD, Defibrillator; Risk                 Factors:Hypertension and Dyslipidemia.                 Aortic Valve: 25 mm Edwards bioprosthetic valve is present in                 the aortic position.  Sonographer:    Irving Burton Senior RDCS Referring Phys: [id number] Dorthula Nettles  Sonographer Comments: Heart Mate III speed 5800 IMPRESSIONS  1. LVIDd 6.03cm, ventricular septum is midline. Left ventricular ejection fraction, by estimation, is <20%. The left ventricle has severely decreased function. The left ventricle demonstrates global hypokinesis. The left  ventricular internal cavity size  was moderately dilated. The LVAD cannuyla is seen in the apex  2. RVOT VTI is 13.1cm. Right ventricular systolic function is mildly reduced. The interventricular septum is midline  3. Trivial mitral valve regurgitation. Moderate mitral annular  Advanced Heart Failure Team  Discharge Summary   Patient ID: Brandon [name] MRN: [id number], DOB/AGE: 10-Nov-1955 67 y.o. Admit date: 02/05/2023 D/C date:     02/10/2023   Primary Discharge Diagnoses:  Blood Loss Anemia  Chronic HFrEF HM3 LVAD, DT VT/VF  Secondary Discharge Diagnoses:  CAD PAF Lung Ca  Hospital Course:   Brandon [name] is a 67 year old with a history of  tobacco use, CAD s/p previous anterior MI, severe systolic HF s/p HM-3 VAD 04/06/21, barostim, severe AS s/p AVR, endocarditis.   Admitted for cardiogenic shock in 02/2021.  He underwent placement of HM3 VAD and aortic valve replacement with 25 mm Edwards Inspiris Resilia valve on 04/06/21. Surgical path came back 11/28 with evidence of possible "acute aortic valve endocarditis". 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    Admitted 04/2022 with ventricular fibrillation.  Was defibrillated.  Then on Christmas day he presented back with chest pain, nausea, and shortness of breath.  ECG showed ST elevation.  Catheterization showed stable CAD. Cardiac CT suspicious for clot on AoV. INR increased to 2.5-3.0 range.    Had PET 1/24 which showed increased size of the spiculated LUL pulmonary nodule which is hypermetabolic concerning for primary bronchogenic carcinoma. Has seen Dr. Dorris Fetch and referred for stereotactic radiation without biopsy. Completed radiation.    He was feeling great until a week ago. Complaining of dark stool for the last week. Had lab work in the VAD clinic on 9/24. Hgb trending down 12.4>10.7. INR 2.7. He was instructed to hold coumadin. Next day on 9/25 he developed lightheadedness, dyspnea with exertion, and recurrent black stool. Subsequently called for direct admit.   On arrival, repeat hgb was 10.3. INR 2.0. He was given a bolus of IVFs. Protonix increased to 40 mg bid. GI consulted. On 9/26, underwent EGD showing 1 duodenal AVM which was treated  APC.   During admission, episode of hypotension requiring additional 500cc bolus IVF. Epleronone decreases to 25mg  daily at discharge. Intermittent runs of VT. Today limited ECHO appeared stable on read by Dr. Gasper Lloyd at bedside. Metoprolol increased to 25mg  daily and losartan stopped, with improvement of VT runs/ectopy.   Next Lab INR check 10/3, patient to call. Next HF Clinic appointment 10/29 at 10:00am  See below for Hospital Course by problem:  1. Symptomatic Anemia  - Prior to admit melena x 7 days and developed concerning symptoms. Lightheaded.  - 9/26 s/p APC treatment of AVM.  - no BM x 3 days. Hgb 10.7>>9.9  - Continue Protonix 40 bid    2. Chronic HFrEF, ICU  - Echo 05/19/20 EF 20-25% RV mildly HK. moderate AS  Mean gradient 13 AVA 1.2 cm2 DI 0.30. - Echo 12/30/22: LVEF < 20% LVIDd 6.1cm. Cannula well placed. RV mildly HK. AoV not opening. No AI.  - s/p HM-III VAD + bioprosthetic AVR 04/06/21 - Appears euvolemic  - Continue current  Toprol XL will increase to 25 mg daily to help w/ NSVT - Stop losartan to allow Toprol XL increase  - Decrease Eplerenone to 25mg  daily     3. HMIII LVAD, DT  - Hold ASA  - Goal INR 2.5-3.0 - INR 1.9 today; can stop heparin gtt today  - Warfarin dosing per pharmD.    4. VT/VF -Most recent VT/VF 04/2022 -NSVT this admit -Maintain K>4;Mg>2  -Increase Toprol XL -Hold Losartan -Limited ECHO pending final read. Stable preliminary read by Dr. Gasper Lloyd at

## 2023-02-06 NOTE — H&P (View-Only) (Signed)
Consultation  Referring Provider:   Dr. Gala Romney Primary Care Physician:  Deeann Saint, MD Primary Gastroenterologist: Dr. Adela Lank        Reason for Consultation: Melena            HPI:   Brandon [name] is a 67 y.o. male with a past medical history as listed below including CAD status post previous anterior MI, severe systolic heart failure status post LVAD 04/06/2021, severe aortic stenosis status post AVR and endocarditis, who presented to the hospital as a direct admission from the advanced heart failure care team for symptomatic anemia.    Apparently patient was feeling good until about a week ago and then started having some dark stool over the past week.  He had a hemoglobin in the LVAD clinic which had trended down from 12.4-10.7.  He was told to stop his Coumadin, INR 2.7.  Still on Aspirin.  Yesterday was complaining of lightheadedness, dyspnea with exertion and black stool.    Today, patient tells me that back in March he had an episode of some black stools for a few days but went to the heart failure clinic and was found to have a fairly normal hemoglobin around 13, was told to keep an eye on it and after he took some antibiotics for a tooth infection the black stools actually stopped.  He has seen none until about a week ago and is seeing at least 1 or 2 a day, some with a sticky tarry appearance.  Also started feeling lightheaded and dizzy.  Also noticed that he was more fatigued when doing his normal exercise routines.  Tells me that he had decreased his Pantoprazole to 40 mg once a day because he picked up an old prescription which had this written on the bottle and was not sure how he should be taking it.    He did have some clears around 8:00 this morning but has not eaten or drank anything since then.    Denies fever, chills, weight loss, abdominal pain, nausea or vomiting.  Cardiac history per Dr. Prescott Gum note: Admitted for cardiogenic shock in 02/2021.  He  underwent placement of HM3 VAD and aortic valve replacement with 25 mm Edwards Inspiris Resilia valve on 04/06/21. Surgical path came back 11/28 with evidence of possible "acute aortic valve endocarditis". 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    Admitted 04/2022 with ventricular fibrillation.  Was defibrillated.  Then on Christmas day he presented back with chest pain, nausea, and shortness of breath.  ECG showed ST elevation.  Catheterization showed stable CAD. Cardiac CT suspicious for clot on AoV. INR increased to 2.5-3.0 range.    Had PET 1/24 which showed increased size of the spiculated LUL pulmonary nodule which is hypermetabolic concerning for primary bronchogenic carcinoma. Has seen Dr. Dorris Fetch and referred for stereotactic radiation without biopsy. Completed radiation.   GI history: 04/24/2020 colonoscopy with Dr. Adela Lank with one 8 mm polyp in the cecum, two 3-6 mm polyps in the ascending colon, 115 mm polyp in the transverse colon, diverticulosis in the sigmoid colon internal hemorrhoids, repeat recommended in 3 years  Past Medical History:  Diagnosis Date   AICD (automatic cardioverter/defibrillator) present 08/31/2019   Ankylosing spondylitis (HCC)    Arthritis    BENIGN PROSTATIC HYPERTROPHY 06/07/2008   CHF (congestive heart failure) (HCC)    COLONIC POLYPS, HX OF 06/07/2008   Coronary artery disease  Depression    H/O hiatal hernia    Heart failure (HCC)    History of radiation therapy    Left Lung- 08/06/22-08/16/22- Dr. Antony Blackbird   HYPERLIPIDEMIA 06/07/2008   HYPERTENSION 06/07/2008   Myocardial infarction [facility]) 2005   NSTEMI, s/p LAD stent   NEPHROLITHIASIS, HX OF 06/07/2008   STEMI (ST elevation myocardial infarction) (HCC) 04/27/2019    Past Surgical History:  Procedure Laterality Date   AORTIC VALVE REPLACEMENT N/A 04/06/2021   Procedure: AORTIC VALVE REPLACEMENT (AVR) WITH INSPIRIS  RESILIA  AORTIC VALVE SIZE ;  Surgeon: Alleen Borne, MD;  Location: [facility] OR;  Service: Open Heart Surgery;  Laterality: N/A;   COLONOSCOPY WITH PROPOFOL N/A 04/24/2020   Procedure: COLONOSCOPY WITH PROPOFOL;  Surgeon: Benancio Deeds, MD;  Location: [facility]COPY;  Service: Gastroenterology;  Laterality: N/A;   CORONARY ANGIOPLASTY WITH STENT PLACEMENT  2005   LAD stent, jailed diagonal   CORONARY/GRAFT ACUTE MI REVASCULARIZATION N/A 04/27/2019   Procedure: Coronary/Graft Acute MI Revascularization;  Surgeon: Corky Crafts, MD;  Location: [facility] INVASIVE CV LAB;  Service: Cardiovascular;  Laterality: N/A;   ICD IMPLANT  08/31/2019   ICD IMPLANT N/A 08/31/2019   Procedure: ICD IMPLANT;  Surgeon: Hillis Range, MD;  Location: [facility] INVASIVE CV LAB;  Service: Cardiovascular;  Laterality: N/A;   ICD LEAD REMOVAL N/A 12/04/2020   Procedure: ICD SYSTEM EXTRACTION;  Surgeon: Lanier Prude, MD;  Location: [facility] OR;  Service: Cardiovascular;  Laterality: N/A;   INSERTION OF IMPLANTABLE LEFT VENTRICULAR ASSIST DEVICE N/A 04/06/2021   Procedure: INSERTION OF IMPLANTABLE LEFT VENTRICULAR ASSIST DEVICE;  Surgeon: Alleen Borne, MD;  Location: [facility] OR;  Service: Open Heart Surgery;  Laterality: N/A;  HM3   IR THORACENTESIS ASP PLEURAL SPACE W/IMG GUIDE  04/19/2021   IR THORACENTESIS ASP PLEURAL SPACE W/IMG GUIDE  04/20/2021   IR THORACENTESIS ASP PLEURAL SPACE W/IMG GUIDE  04/23/2021   LAMINECTOMY     lumbar   LEFT HEART CATH AND CORONARY ANGIOGRAPHY N/A 04/27/2019   Procedure: LEFT HEART CATH AND CORONARY ANGIOGRAPHY;  Surgeon: Corky Crafts, MD;  Location: [facility] INVASIVE CV LAB;  Service: Cardiovascular;  Laterality: N/A;   LUMBAR FUSION  01/2012   POLYPECTOMY  04/24/2020   Procedure: POLYPECTOMY;  Surgeon: Benancio Deeds, MD;  Location: [facility];  Service: Gastroenterology;;   RIGHT HEART CATH N/A 04/27/2019   Procedure: RIGHT HEART CATH;  Surgeon: Swaz[place], Curtez M, MD;  Location: [facility]  INVASIVE CV LAB;  Service: Cardiovascular;  Laterality: N/A;   RIGHT HEART CATH N/A 03/28/2021   Procedure: RIGHT HEART CATH;  Surgeon: Dolores Patty, MD;  Location: [facility] INVASIVE CV LAB;  Service: Cardiovascular;  Laterality: N/A;   RIGHT HEART CATH AND CORONARY ANGIOGRAPHY N/A 05/06/2022   Procedure: RIGHT HEART CATH AND CORONARY ANGIOGRAPHY;  Surgeon: Dolores Patty, MD;  Location: [facility] INVASIVE CV LAB;  Service: Cardiovascular;  Laterality: N/A;   RIGHT/LEFT HEART CATH AND CORONARY ANGIOGRAPHY N/A 11/29/2020   Procedure: RIGHT/LEFT HEART CATH AND CORONARY ANGIOGRAPHY;  Surgeon: Dolores Patty, MD;  Location: [facility] INVASIVE CV LAB;  Service: Cardiovascular;  Laterality: N/A;   TEE WITHOUT CARDIOVERSION N/A 12/04/2020   Procedure: TRANSESOPHAGEAL ECHOCARDIOGRAM (TEE);  Surgeon: Lanier Prude, MD;  Location: [facility] OR;  Service: Cardiovascular;  Laterality: N/A;   TEE WITHOUT CARDIOVERSION N/A 04/06/2021   Procedure: TRANSESOPHAGEAL ECHOCARDIOGRAM (TEE);  Surgeon: Alleen Borne, MD;  Location: [facility] OR;  Service: Open Heart Surgery;  Laterality: N/A;  TONSILLECTOMY     warthin tumor removal Left 2014    Family History  Problem Relation Age of Onset   Depression Father    Arthritis Sister        RA   Heart failure Mother    Other Neg Hx    Colon cancer Neg Hx    Esophageal cancer Neg Hx    Rectal cancer Neg Hx    Stomach cancer Neg Hx    Colon polyps Neg Hx     Social History   Tobacco Use   Smoking status: Former    Current packs/day: 0.00    Average packs/day: 1 pack/day for 40.0 years (40.0 ttl pk-yrs)    Types: Cigarettes    Start date: 07/26/1979    Quit date: 07/26/2019    Years since quitting: 3.5   Smokeless tobacco: Never   Tobacco comments:    1-1.5 pks per year x 40-45 yrs  Vaping Use   Vaping status: Some Days   Substances: CBD  Substance Use Topics   Alcohol use: Yes    Alcohol/week: 7.0 standard drinks of alcohol    Types: 7 Shots of liquor per week     Comment: "2 vodka tonics a night"   Drug use: Yes    Comment: occasionally - CBD oil    Prior to Admission medications   Medication Sig Start Date End Date Taking? Authorizing Provider  acetaminophen (TYLENOL) 500 MG tablet Take 500-1,000 mg by mouth every 6 (six) hours as needed (for pain).   Yes [provider]  aspirin EC 81 MG tablet Take 1 tablet (81 mg total) by mouth daily. Swallow whole. 05/12/22  Yes Dunn, Dayna N, PA-C  atorvastatin (LIPITOR) 80 MG tablet PATIENT MUST ATTEND ANNUAL APPOINTMENT FOR FUTURE REFILLS TAKE 1 TABLET BY MOUTH ONCE DAILY AT 6PM. Patient taking differently: Take 80 mg by mouth every evening. 10/02/22  Yes Crenshaw, Madolyn Frieze, MD  Carboxymethylcellulose Sodium (ARTIFICIAL TEARS OP) Place 1 drop into both eyes daily as needed (for dryness or irritation).   Yes [provider]  eplerenone (INSPRA) 50 MG tablet Take 1 tablet (50 mg total) by mouth in the morning. 08/28/21  Yes Bensimhon, Bevelyn Buckles, MD  ezetimibe (ZETIA) 10 MG tablet TAKE ONE TABLET BY MOUTH DAILY 07/22/22  Yes Bensimhon, Bevelyn Buckles, MD  gabapentin (NEURONTIN) 300 MG capsule Take 1 capsule (300 mg total) by mouth 3 (three) times daily. 12/30/22  Yes Bensimhon, Bevelyn Buckles, MD  KLOR-CON M20 20 MEQ tablet TAKE 1 TABLET BY MOUTH EVERY DAY 09/16/22  Yes Laurey Morale, MD  losartan (COZAAR) 25 MG tablet Take 0.5 tablets (12.5 mg total) by mouth daily. 03/29/22  Yes Robbie Lis M, PA-C  metoprolol succinate (TOPROL-XL) 25 MG 24 hr tablet TAKE 1/2 TABLET BY MOUTH EVERY DAY 10/09/22  Yes Juanda Crumble K, PA-C  nystatin (MYCOSTATIN/NYSTOP) powder Apply 1 Application topically 3 (three) times daily. Patient taking differently: Apply 1 Application topically 3 (three) times daily as needed (For dry skin). 10/10/22  Yes Bensimhon, Bevelyn Buckles, MD  pantoprazole (PROTONIX) 40 MG tablet Take 1 tablet (40 mg total) by mouth 2 (two) times daily. 08/22/22  Yes Bensimhon, Bevelyn Buckles, MD  sertraline  (ZOLOFT) 50 MG tablet TAKE 1/2 TAB BY MOUTH DAILY FOR 1 WEEK, THEN INCREASE TO 1 TABLET DAILY. Patient taking differently: Take 50 mg by mouth daily. Pt taking 50 mg daily 06/26/22  Yes Bensimhon, Bevelyn Buckles, MD  tamsulosin (FLOMAX) 0.4 MG CAPS capsule  Take 2 capsules (0.8 mg total) by mouth daily. Patient taking differently: Take 0.4 mg by mouth daily. 10/25/22  Yes Deeann Saint, MD  traMADol (ULTRAM) 50 MG tablet TAKE 1-2 TABLETS BY MOUTH TWICE A DAY FOR 30 DAYS Patient taking differently: Take 50 mg by mouth every 6 (six) hours as needed for moderate pain. 10/25/22  Yes Deeann Saint, MD  warfarin (COUMADIN) 2.5 MG tablet TAKE 7.5 MG (3 TABLETS) EVERY MONDAY/FRIDAY AND 5 MG (2 TABLETS) ALL OTHER DAYS OR AS INSTRUCTED BY LVAD CLINIC. Patient taking differently: Take two of the 2.5 mg tablets for total of 5mg  on Mondays and all other days take 3 of the 2.5 mg tablets for total of 7.5mg  per patient 12/25/22  Yes Bensimhon, Bevelyn Buckles, MD  ZYRTEC ALLERGY 10 MG tablet Take 10 mg by mouth daily.   Yes [provider]  torsemide (DEMADEX) 20 MG tablet Take 1 tablet (20 mg total) by mouth as needed. Patient not taking: Reported on 12/23/2022 02/20/22   Bensimhon, Bevelyn Buckles, MD    Current Facility-Administered Medications  Medication Dose Route Frequency Provider Last Rate Last Admin   0.9 %  sodium chloride infusion  250 mL Intravenous PRN Clegg, Amy D, NP       acetaminophen (TYLENOL) tablet 650 mg  650 mg Oral Q4H PRN Clegg, Amy D, NP       ezetimibe (ZETIA) tablet 10 mg  10 mg Oral Daily Clegg, Amy D, NP       gabapentin (NEURONTIN) capsule 300 mg  300 mg Oral TID Clegg, Amy D, NP   300 mg at 02/05/23 1922   losartan (COZAAR) tablet 12.5 mg  12.5 mg Oral Daily Clegg, Amy D, NP       metoprolol succinate (TOPROL-XL) 24 hr tablet 12.5 mg  12.5 mg Oral Daily Clegg, Amy D, NP       ondansetron (ZOFRAN) injection 4 mg  4 mg Intravenous Q6H PRN Clegg, Amy D, NP       pantoprazole (PROTONIX) EC  tablet 40 mg  40 mg Oral BID Clegg, Amy D, NP   40 mg at 02/05/23 2112   potassium chloride SA (KLOR-CON M) CR tablet 20 mEq  20 mEq Oral Daily Clegg, Amy D, NP       sertraline (ZOLOFT) tablet 50 mg  50 mg Oral Daily Clegg, Amy D, NP       sodium chloride flush (NS) 0.9 % injection 3 mL  3 mL Intravenous Q12H Clegg, Amy D, NP   3 mL at 02/05/23 2112   tamsulosin (FLOMAX) capsule 0.4 mg  0.4 mg Oral Daily Clegg, Amy D, NP        Allergies as of 02/05/2023   (No Known Allergies)     Review of Systems:    Constitutional: No weight loss, fever or chills Skin: No rash  Cardiovascular: No chest pain  Respiratory: No SOB  Gastrointestinal: See HPI and otherwise negative Genitourinary: No dysuria  Neurological: No headache Musculoskeletal: No new muscle or joint pain Hematologic: No bruising Psychiatric: No history of depression or anxiety    Physical Exam:  Vital signs in last 24 hours: Temp:  [97.5 F (36.4 C)-98 F (36.7 C)] 97.5 F (36.4 C) (09/26 0802) Pulse Rate:  [70-100] 98 (09/26 0802) Resp:  [13-16] 16 (09/26 0802) BP: (95-122)/(69-95) 100/83 (09/26 0802) SpO2:  [92 %-98 %] 98 % (09/26 0749) Weight:  [90.6 kg-92 kg] 90.6 kg (09/26 0427) Last BM Date :  02/05/23 (pt reports tarry stool) General:   Pleasant Caucasian male appears to be in NAD, Well developed, Well nourished, alert and cooperative Head:  Normocephalic and atraumatic. Eyes:   PEERL, EOMI. No icterus. Conjunctiva pink. Ears:  Normal auditory acuity. Neck:  Supple Throat: Oral cavity and pharynx without inflammation, swelling or lesion. Teeth in good condition. Lungs: Respirations even and unlabored. Lungs clear to auscultation bilaterally.   No wheezes, crackles, or rhonchi.  Heart:+LVAD hum, No peripheral edema, cyanosis or pallor.  Abdomen:  Soft, nondistended, nontender. No rebound or guarding. +LVAD hum, No appreciable masses or hepatomegaly. Rectal:  Not performed.  Msk:  Symmetrical without gross  deformities. Peripheral pulses intact.  Extremities:  Without edema, no deformity or joint abnormality. Normal ROM, normal sensation. Neurologic:  Alert and  oriented x4;  grossly normal neurologically. Skin:   Dry and intact without significant lesions or rashes. Psychiatric: Demonstrates good judgement and reason without abnormal affect or behaviors.   LAB RESULTS: Recent Labs    02/04/23 0852 02/05/23 1819 02/06/23 0234  WBC 6.9 6.8 6.5  HGB 10.7* 10.3* 10.7*  HCT 35.0* 33.1* 34.4*  PLT 180 180 178   BMET Recent Labs    02/05/23 1819 02/06/23 0234  NA 135 138  K 4.2 4.0  CL 106 104  CO2 24 27  GLUCOSE 88 95  BUN 16 12  CREATININE 0.94 0.91  CALCIUM 8.7* 8.7*   PT/INR Recent Labs    02/05/23 1819 02/06/23 0234  LABPROT 22.7* 20.9*  INR 2.0* 1.8*     Impression / Plan:   Impression: 1.  Symptomatic anemia: With history of LVAD placement and now melena x 7 days hemoglobin 12--> 10.7, Coumadin held as of 9/24, INR 1.8 this morning, aspirin held as of yesterday; suspect upper GI bleed 2.  Chronic heart failure: Echo 12/30/2022 with LVEF less than 20%, status post LVAD 04/06/2021 3.  Chronic anticoagulation with Coumadin: Held as of 9/24, INR 1.8 this morning 4.  CAD: Status post anterior STEMI 12/20 5.  Lung cancer: Completed radiation  Plan: 1.  Agree with Pantoprazole 40 mg IV twice daily 2.  Continue to monitor hemoglobin with transfusion as needed less than 7 3.  Patient will be n.p.o. now.  Plans for EGD this morning, patient's last clears were around 8:00 this morning.  Will plan for EGD after 10:00 this morning with Dr. [name].  Did discuss risks, benefits, limitations and alternatives and patient agrees to proceed. 4.  Please await further recommendations from Dr. [name] after time of procedure.  Thank you for your kind consultation, we will continue to follow.  Violet Baldy [facility]  02/06/2023, 8:45 AM

## 2023-02-06 NOTE — Progress Notes (Signed)
LVAD Coordinator Rounding Note:  Admitted 02/05/23 for symptomatic anemia and GI bleeding. Pt called VAD coordinator and reported dark stool x 7 days. Hgb 12.4 on 8/19 and dropped to 10.7 on 9/24. Hgb on admission 10.0.    HM3 LVAD implanted on 04/06/21 by Dr. Laneta Simmers under DT criteria.  Met pt at bedside. Denies complaints. Has not had BM since admission. Plan for EGD today per GI team. See separate note for procedure documentation.   Vital signs: Temp: 97.5 HR: 98 Doppler Pressure: 92 Automatic BP: 100/83 (89) O2 Sat: 98% on RA Wt: 199.7 lbs   LVAD interrogation reveals:  Speed: 5800 Flow: 4.7 Power:  4.5 w PI: 4.2 Alarms: none Events: 100+ Hematocrit: 41  Fixed speed: 5800 Low speed limit: 5500  Drive Line: Existing VAD dressing clean, dry, and intact. Anchor correctly applied. Next dressing change due 02/10/23 by bedside nurse.   Labs:  LDH trend: 137  INR trend: 1.8  Hgb trend: 10.0>10.7  Anticoagulation Plan: -INR Goal: 2.5 - 3.0 (increased 05/09/22) -ASA Dose: 81 mg daily--stopped this admission  Blood Products:   Device: N/A  Arrythmias: none this admission    Plan/Recommendations:  1. Page VAD Coordinator with any equipment of VAD concerns 2. Weekly drive line dressing change per beside RN  Alyce Pagan RN VAD Coordinator  Office: [phone]  24/7 Pager: [phone]

## 2023-02-07 DIAGNOSIS — D649 Anemia, unspecified: Secondary | ICD-10-CM | POA: Diagnosis not present

## 2023-02-07 DIAGNOSIS — K921 Melena: Secondary | ICD-10-CM | POA: Diagnosis not present

## 2023-02-07 LAB — CBC
HCT: 35.1 % — ABNORMAL LOW (ref 39.0–52.0)
Hemoglobin: 10.8 g/dL — ABNORMAL LOW (ref 13.0–17.0)
MCH: 25 pg — ABNORMAL LOW (ref 26.0–34.0)
MCHC: 30.8 g/dL (ref 30.0–36.0)
MCV: 81.3 fL (ref 80.0–100.0)
Platelets: 181 10*3/uL (ref 150–400)
RBC: 4.32 MIL/uL (ref 4.22–5.81)
RDW: 18.5 % — ABNORMAL HIGH (ref 11.5–15.5)
WBC: 7.2 10*3/uL (ref 4.0–10.5)
nRBC: 0 % (ref 0.0–0.2)

## 2023-02-07 LAB — LACTATE DEHYDROGENASE: LDH: 129 U/L (ref 98–192)

## 2023-02-07 LAB — PROTIME-INR
INR: 1.4 — ABNORMAL HIGH (ref 0.8–1.2)
Prothrombin Time: 17.4 s — ABNORMAL HIGH (ref 11.4–15.2)

## 2023-02-07 LAB — BASIC METABOLIC PANEL
Anion gap: 8 (ref 5–15)
BUN: 11 mg/dL (ref 8–23)
CO2: 25 mmol/L (ref 22–32)
Calcium: 8.8 mg/dL — ABNORMAL LOW (ref 8.9–10.3)
Chloride: 105 mmol/L (ref 98–111)
Creatinine, Ser: 0.82 mg/dL (ref 0.61–1.24)
GFR, Estimated: >60 mL/min (ref >60)
Glucose, Bld: 103 mg/dL — ABNORMAL HIGH (ref 70–99)
Potassium: 3.9 mmol/L (ref 3.5–5.1)
Sodium: 138 mmol/L (ref 135–145)

## 2023-02-07 LAB — HEMOGLOBIN: Hemoglobin: 11.6 g/dL — ABNORMAL LOW (ref 13.0–17.0)

## 2023-02-07 LAB — HEPARIN LEVEL (UNFRACTIONATED)
Heparin Unfractionated: <0.1 [IU]/mL — ABNORMAL LOW (ref 0.30–0.70)
Heparin Unfractionated: <0.1 [IU]/mL — ABNORMAL LOW (ref 0.30–0.70)

## 2023-02-07 MED ORDER — HEPARIN (PORCINE) 25000 UT/250ML-% IV SOLN
500.0000 [IU]/h | INTRAVENOUS | Status: DC
  Administered 2023-02-07 – 2023-02-09 (×2): 500 [IU]/h via INTRAVENOUS
  Filled 2023-02-07 (×2): qty 250

## 2023-02-07 MED ORDER — WARFARIN SODIUM 7.5 MG PO TABS
7.5000 mg | ORAL_TABLET | Freq: Once | ORAL | Status: AC
  Administered 2023-02-07: 7.5 mg via ORAL
  Filled 2023-02-07: qty 1

## 2023-02-07 NOTE — Plan of Care (Signed)
  Problem: Education: Goal: Understanding of CV disease, CV risk reduction, and recovery process will improve Outcome: Progressing Goal: Individualized Educational Video(s) Outcome: Progressing   Problem: Activity: Goal: Ability to return to baseline activity level will improve Outcome: Progressing   Problem: Cardiovascular: Goal: Ability to achieve and maintain adequate cardiovascular perfusion will improve Outcome: Progressing Goal: Vascular access site(s) Level 0-1 will be maintained Outcome: Progressing   Problem: Health Behavior/Discharge Planning: Goal: Ability to safely manage health-related needs after discharge will improve Outcome: Progressing   Problem: Education: Goal: Patient will understand all VAD equipment and how it functions Outcome: Progressing Goal: Patient will be able to verbalize current INR target range and antiplatelet therapy for discharge home Outcome: Progressing   Problem: Cardiac: Goal: LVAD will function as expected and patient will experience no clinical alarms Outcome: Progressing   Problem: Education: Goal: Knowledge of General Education information will improve Description: Including pain rating scale, medication(s)/side effects and non-pharmacologic comfort measures Outcome: Progressing   Problem: Health Behavior/Discharge Planning: Goal: Ability to manage health-related needs will improve Outcome: Progressing   Problem: Clinical Measurements: Goal: Ability to maintain clinical measurements within normal limits will improve Outcome: Progressing Goal: Will remain free from infection Outcome: Progressing Goal: Diagnostic test results will improve Outcome: Progressing Goal: Respiratory complications will improve Outcome: Progressing Goal: Cardiovascular complication will be avoided Outcome: Progressing   Problem: Activity: Goal: Risk for activity intolerance will decrease Outcome: Progressing   Problem: Nutrition: Goal: Adequate  nutrition will be maintained Outcome: Progressing   Problem: Coping: Goal: Level of anxiety will decrease Outcome: Progressing   Problem: Elimination: Goal: Will not experience complications related to bowel motility Outcome: Progressing Goal: Will not experience complications related to urinary retention Outcome: Progressing   Problem: Pain Managment: Goal: General experience of comfort will improve Outcome: Progressing   Problem: Safety: Goal: Ability to remain free from injury will improve Outcome: Progressing   Problem: Skin Integrity: Goal: Risk for impaired skin integrity will decrease Outcome: Progressing

## 2023-02-07 NOTE — TOC Initial Note (Signed)
Transition of Care [facility]) - Initial/Assessment Note    Patient Details  Name: Brandon [name] MRN: [id number] Date of Birth: January 26, 1956  Transition of Care [facility]) CM/SW Contact:    Elliot Cousin, RN Phone Number: [phone] 02/07/2023, 1:49 PM  Clinical Narrative:  CM spoke to pt and states he is independent at home. Has need DME in home. State his friend or sister or friend will provide transportation to appts. States he has a caregiver, Mindi Junker that changes his LVAD dressings at home. Will continue to follow for dc needs.                  Expected Discharge Plan: Home/Self Care Barriers to Discharge: Continued Medical Work up   Patient Goals and CMS Choice Patient states their goals for this hospitalization and ongoing recovery are:: wants to remain independent          Expected Discharge Plan and Services   Discharge Planning Services: CM Consult   Living arrangements for the past 2 months: Single Family Home                                      Prior Living Arrangements/Services Living arrangements for the past 2 months: Single Family Home Lives with:: Self Patient language and need for interpreter reviewed:: Yes Do you feel safe going back to the place where you live?: Yes      Need for Family Participation in Patient Care: No (Comment) Care giver support system in place?: No (comment) Current home services: DME (rolling walker, bedside commode, scale) Criminal Activity/Legal Involvement Pertinent to Current Situation/Hospitalization: No - Comment as needed  Activities of Daily Living   ADL Screening (condition at time of admission) Does the patient have a NEW difficulty with bathing/dressing/toileting/self-feeding that is expected to last >3 days?: No Does the patient have a NEW difficulty with getting in/out of bed, walking, or climbing stairs that is expected to last >3 days?: No Does the patient have a NEW difficulty with communication that is  expected to last >3 days?: No Is the patient deaf or have difficulty hearing?: No Does the patient have difficulty seeing, even when wearing glasses/contacts?: No Does the patient have difficulty concentrating, remembering, or making decisions?: No  Permission Sought/Granted Permission sought to share information with : Case Manager, Family Supports, PCP Permission granted to share information with : Yes, Verbal Permission Granted  Share Information with NAME: Patty Gaylan Gerold  Permission granted to share info w AGENCY: Home Health, DME  Permission granted to share info w Relationship: sister  Permission granted to share info w Contact Information: [phone]  Emotional Assessment Appearance:: Appears stated age Attitude/Demeanor/Rapport: Engaged Affect (typically observed): Accepting Orientation: : Oriented to Self, Oriented to Place, Oriented to  Time, Oriented to Situation   Psych Involvement: No (comment)  Admission diagnosis:  Symptomatic anemia [D64.9] Patient Active Problem List   Diagnosis Date Noted   Melena 02/06/2023   Symptomatic anemia 02/05/2023   Aortic atherosclerosis (HCC) 05/15/2022   S/P AVR (aortic valve replacement) 05/12/2022   History of endocarditis 05/12/2022   COVID-19 virus infection 05/12/2022   Lung nodule 05/12/2022   Aortic valve stenosis 05/07/2022   Dissection of aorta (HCC) 05/06/2022   Acute ST elevation myocardial infarction (STEMI) due to occlusion of left anterior descending (LAD) coronary artery (HCC) 05/06/2022   VF (ventricular fibrillation) (HCC) 04/22/2022   Long term (  current) use of anticoagulants 05/15/2021   Presence of left ventricular assist device (LVAD) (HCC) 04/06/2021   Protein-calorie malnutrition, severe 03/14/2021   ILD (interstitial lung disease) (HCC)    Ankylosing spondylitis (HCC)    Chronic systolic CHF (congestive heart failure) (HCC) 03/07/2021   Acute on chronic systolic (congestive) heart failure (HCC)  01/08/2021   Dental caries    Malnutrition of moderate degree 12/01/2020   Pressure injury of skin 12/01/2020   Cholelithiases    Aortic valve endocarditis    Elevated brain natriuretic peptide (BNP) level 11/30/2020   Elevated troponin 11/30/2020   Hypokalemia 11/30/2020   Hypomagnesemia 11/30/2020   Normocytic anemia 11/30/2020   Dyspnea    Infected defibrillator (HCC)    Bacteremia    Central line infection    Acute respiratory failure with hypoxia and hypercapnia (HCC) 11/29/2020   Centrilobular emphysema (HCC) 08/09/2020   Benign neoplasm of colon    Heart failure (HCC) 12/31/2019   Ischemic cardiomyopathy 08/31/2019   STEMI (ST elevation myocardial infarction) (HCC) 04/27/2019   Acute ST elevation myocardial infarction (STEMI) of anterior wall (HCC) 04/27/2019   Mild aortic stenosis 12/02/2012   Ankylosis of sacroiliac joint 03/08/2011   TOBACCO ABUSE 03/13/2009   Hyperlipidemia with target LDL less than 70 06/07/2008   Essential hypertension 06/07/2008   CAD (coronary artery disease) 06/07/2008   BENIGN PROSTATIC HYPERTROPHY 06/07/2008   History of colon polyps 06/07/2008   NEPHROLITHIASIS, HX OF 06/07/2008   PCP:  Deeann Saint, MD Pharmacy:   [id number] - [place], [place] - [address] [place] [address] Phone: [phone] Fax: [phone]  [id number] - [place], [place] - [address] [place] [address] Phone: [phone] Fax: [phone]  [id number] [place], [place] - [address] [place] [address] Phone: [phone] Fax: [phone]     Social Determinants of Health (SDOH) Social History: SDOH Screenings   Food Insecurity: No Food Insecurity (02/05/2023)  Housing: Low Risk  (02/05/2023)  Transportation Needs: No Transportation Needs (02/05/2023)  Utilities: Not At Risk (02/05/2023)  Alcohol Screen: Low Risk  (07/26/2022)  Depression  (PHQ2-9): Low Risk  (09/18/2022)  Financial Resource Strain: Low Risk  (07/26/2022)  Physical Activity: Sufficiently Active (09/10/2021)  Recent Concern: Physical Activity - Insufficiently Active (07/19/2021)  Social Connections: Socially Isolated (07/26/2022)  Stress: No Stress Concern Present (07/26/2022)  Tobacco Use: Medium Risk (02/05/2023)   SDOH Interventions:     Readmission Risk Interventions     No data to display

## 2023-02-07 NOTE — Progress Notes (Signed)
Progress Note   Subjective  Hospital day #2 Chief Complaint: Melena  Today, the patient is found walking around the halls, he is tells me that he feels well he did just have a bowel movement after his blood draw this morning and tells me he is still dark (I did visualize this in the toilet is a dark stool but I do not think melenic).  Continues to deny any abdominal pain.  Aware that he will be in the hospital while his INR gets back to the level they need it at for his LVAD.   Objective   Vital signs in last 24 hours: Temp:  [97.6 F (36.4 C)-98.2 F (36.8 C)] 97.7 F (36.5 C) (09/27 1127) Pulse Rate:  [62-81] 77 (09/27 1127) Resp:  [14-17] 14 (09/27 1127) BP: (86-109)/(69-85) 109/82 (09/27 1127) SpO2:  [94 %-98 %] 95 % (09/27 1127) Weight:  [90.9 kg] 90.9 kg (09/27 0405) Last BM Date : 02/05/23 General:    white male in NAD Heart:  Regular rate and rhythm; no murmurs Lungs: Respirations even and unlabored, lungs CTA bilaterally Abdomen:  Soft, nontender and nondistended. Normal bowel sounds. Psych:  Cooperative. Normal mood and affect.  Intake/Output from previous day: 09/26 0701 - 09/27 0700 In: 501.9 [P.O.:480; I.V.:21.9] Out: 3875 [Urine:3875] Intake/Output this shift: Total I/O In: -  Out: 1075 [Urine:1075]  Lab Results: Recent Labs    02/05/23 1819 02/06/23 0234 02/07/23 0331  WBC 6.8 6.5 7.2  HGB 10.3* 10.7* 10.8*  HCT 33.1* 34.4* 35.1*  PLT 180 178 181   BMET Recent Labs    02/05/23 1819 02/06/23 0234 02/07/23 0331  NA 135 138 138  K 4.2 4.0 3.9  CL 106 104 105  CO2 24 27 25   GLUCOSE 88 95 103*  BUN 16 12 11   CREATININE 0.94 0.91 0.82  CALCIUM 8.7* 8.7* 8.8*   LFT No results for input(s): "PROT", "ALBUMIN", "AST", "ALT", "ALKPHOS", "BILITOT", "BILIDIR", "IBILI" in the last 72 hours. PT/INR Recent Labs    02/06/23 0234 02/07/23 0331  LABPROT 20.9* 17.4*  INR 1.8* 1.4*   EGD 02/05/2026 Findings:      The examined esophagus was  normal.      Localized inflammation characterized by congestion (edema), erythema and       granularity was found in the gastric antrum. Possibly a healing ulcer in       the stomach.      A single 2 mm angioectasia without bleeding was found in the second       portion of the duodenum. Coagulation for bleeding prevention using argon       plasma at 0.8 liters/minute and 20 watts was successful. Impression:               - Normal esophagus.                           - Gastritis.                           - A single non-bleeding angioectasia in the                            duodenum. Treated with argon plasma coagulation                            (  APC).                           - No specimens collected. Recommendation:           - Return patient to ICU for ongoing care.                           - Continue PPI BID.                           - Will check H pylori serum antibody.                           - Continue to trend hemoglobin and monitor for                            continued signs of bleeding.                           - If any signs of continued bleeding, then could                            consider SBE versus VCE for further evaluation.                           - CLD for today. If no signs of bleeding by                            tomorrow, then can advance.                           - The findings and recommendations were discussed                            with the patient.   Assessment / Plan:   Assessment: 1.  Symptomatic anemia with melena: With history of LVAD placement and reported melena, hemoglobin initially 12--> 1.7, Coumadin held and EGD completed yesterday with gastritis and a single AVM treated with APC, uncertain if this is the source, patient reports dark stool this morning 2.  Chronic heart failure: Echo 12/30/2022 with LVEF less than 20%, status post LVAD 04/06/2021 3.  Chronic anticoagulation with Coumadin: Back on a heparin drip this morning 4.   CAD: Status post anterior STEMI 12/20 5.  Lung cancer: Completed radiation  Plan: 1.  Continue Pantoprazole 40 mg IV twice daily 2.  Continue to monitor hemoglobin and transfuse as needed less than 7.  Did order stat repeat now, if it remains stable then can increase diet back to heart healthy and we will likely sign off.  If he has a drop we will need to consider pill capsule endoscopy +/- colonoscopy 3.  H. pylori serum antibody still pending, will follow-up with patient if this is positive   Again if hemoglobin is stable we will sign off.  Please call us back if we can be of any further assistance.  Dr. Nicholes Mango are covering our service this weekend.    LOS: 2 days   Unk Lightning  02/07/2023,  12:15 PM

## 2023-02-07 NOTE — Progress Notes (Addendum)
Advanced Heart Failure VAD Team Note  PCP-Cardiologist: Olga Millers, MD   Subjective:    9/25: admitted w/ symptomatic anemia and GIB. Hgb 10 9/26: s/p APC treatment of AVM.   INR 1.4 Hgb 10.8  Denies SOB. No BM overnight.    LVAD INTERROGATION:  HeartMate III LVAD:   Flow 4.4 liters/min, speed 5800, power 5, PI 8. 3  > 100 PI events   Objective:    Vital Signs:   Temp:  [97.3 F (36.3 C)-97.7 F (36.5 C)] 97.7 F (36.5 C) (09/27 0407) Pulse Rate:  [55-98] 75 (09/27 0407) Resp:  [13-18] 15 (09/27 0407) BP: (63-107)/(47-90) 101/78 (09/27 0407) SpO2:  [94 %-99 %] 95 % (09/27 0407) Weight:  [90.9 kg] 90.9 kg (09/27 0405) Last BM Date : 02/05/23 Mean arterial Pressure 80s   Intake/Output:   Intake/Output Summary (Last 24 hours) at 02/07/2023 0711 Last data filed at 02/07/2023 0410 Gross per 24 hour  Intake 501.88 ml  Output 3875 ml  Net -3373.12 ml     Physical Exam    Physical Exam: GENERAL: No acute distress. HEENT: normal  NECK: Supple, JVP  flat .  2+ bilaterally, no bruits.  No lymphadenopathy or thyromegaly appreciated.   CARDIAC:  Mechanical heart sounds with LVAD hum present.  LUNGS:  Clear to auscultation bilaterally.  ABDOMEN:  Soft, round, nontender, positive bowel sounds x4.     LVAD exit site:   Dressing dry and intact.  No erythema or drainage.  Stabilization device present and accurately applied.  Driveline dressing is being changed daily per sterile technique. EXTREMITIES:  Warm and dry, no cyanosis, clubbing, rash or edema  NEUROLOGIC:  Alert and oriented x 3.    No aphasia.  No dysarthria.  Affect pleasant.      Telemetry   Artifact Barostim/HMIII VAD. Looks like SR.   EKG    N/A  Labs   Basic Metabolic Panel: Recent Labs  Lab 02/05/23 1819 02/06/23 0233 02/06/23 0234 02/07/23 0331  NA 135  --  138 138  K 4.2  --  4.0 3.9  CL 106  --  104 105  CO2 24  --  27 25  GLUCOSE 88  --  95 103*  BUN 16  --  12 11  CREATININE  0.94  --  0.91 0.82  CALCIUM 8.7*  --  8.7* 8.8*  MG  --  2.0  --   --     Liver Function Tests: No results for input(s): "AST", "ALT", "ALKPHOS", "BILITOT", "PROT", "ALBUMIN" in the last 168 hours. No results for input(s): "LIPASE", "AMYLASE" in the last 168 hours. No results for input(s): "AMMONIA" in the last 168 hours.  CBC: Recent Labs  Lab 02/04/23 0852 02/05/23 1819 02/06/23 0234 02/07/23 0331  WBC 6.9 6.8 6.5 7.2  HGB 10.7* 10.3* 10.7* 10.8*  HCT 35.0* 33.1* 34.4* 35.1*  MCV 82.0 81.5 81.3 81.3  PLT 180 180 178 181    INR: Recent Labs  Lab 02/04/23 0852 02/05/23 1819 02/06/23 0234 02/07/23 0331  INR 2.7* 2.0* 1.8* 1.4*    Other results:   Imaging   No results found.   Medications:     Scheduled Medications:  atorvastatin  80 mg Oral Daily   ezetimibe  10 mg Oral Daily   gabapentin  300 mg Oral TID   losartan  12.5 mg Oral Daily   metoprolol succinate  12.5 mg Oral Daily   pantoprazole  40 mg Oral BID   sertraline  50 mg Oral Daily   sodium chloride flush  3 mL Intravenous Q12H   tamsulosin  0.4 mg Oral Daily   Warfarin - Pharmacist Dosing Inpatient   Does not apply q1600    Infusions:  sodium chloride      PRN Medications: sodium chloride, acetaminophen, ondansetron (ZOFRAN) IV   Patient Profile   Brandon [name] is a 67 year old with a history of  tobacco use, CAD s/p previous anterior MI, severe systolic HF s/p HM-3 VAD 04/06/21, barostim, severe AS s/p AVR, endocarditis.    Admitted with symptomatic anemia.  Assessment/Plan:    1. Symptomatic Anemia  -Prior to admit melena x 7 days and developed concerning symptoms. Lightheaded.  - 9/26 s/p APC treatment of AVM.  -Hgb 12>10.7>10.8   -Continue Protonix 40 bid  - No BM overnight.   2. Chronic HFrEF, ICU  -Echo 05/19/20 EF 20-25% RV mildly HK. moderate AS  Mean gradient 13 AVA 1.2 cm2 DI 0.30. -Echo 12/30/22: LVEF < 20% LVIDd 6.1cm. Cannula well placed. RV mildly HK. AoV not opening.  No AI.  - s/p HM-III VAD + bioprosthetic AVR 04/06/21 - Appears euvolemic  - Continue current dose to Toprol XL and Losartan.    3. HMIII LVAD, DT  -Hold ASA and coumadin.  -INR 1.4.  LDH 129   - Start Heparin drip. Home when INR 1.8   4. VT/VF -Most recent 12/20223   5. CAD  - s/p anterior STEMI (12/20). LHC showed chronically occluded RCA (with L>>R collaterals) and thrombotic occlusion of proximal LAD. Underwent PCI of LAD. - Recurrent anterior STEMI in 12/23 Cath 12/23 LAD stent patent. Felt to be embolic from aortic valve - Continue atorvastatin 80 mg daily    6. PAF - He has been off amio. Maintaining SR.    7. Lung Cancer Completed radiation    I reviewed the LVAD parameters from today, and compared the results to the patient's prior recorded data.  No programming changes were made.  The LVAD is functioning within specified parameters.  The patient performs LVAD self-test daily.  LVAD interrogation was negative for any significant power changes, alarms or PI events/speed drops.  LVAD equipment check completed and is in good working order.  Back-up equipment present.   LVAD education done on emergency procedures and precautions and reviewed exit site care.  Length of Stay: 2  Tonye Becket, NP 02/07/2023, 7:11 AM  VAD Team --- VAD ISSUES ONLY--- Pager [phone] (7am - 7am)  Advanced Heart Failure Team  Pager [phone] (M-F; 7a - 5p)  Please contact [facility] for night-coverage after hours (5p -7a ) and weekends on [email or web address]  Patient seen and examined with the above-signed Advanced Practice Provider and/or Housestaff. I personally reviewed laboratory data, imaging studies and relevant notes. I independently examined the patient and formulated the important aspects of the plan. I have edited the note to reflect any of my changes or salient points. I have personally discussed the plan with the patient and/or family.  No further bleeding. Hgb stable. INR 1.4. Denies CP or  SOB.   General:  NAD.  HEENT: normal  Neck: supple. JVP not elevated.  Carotids 2+ bilat; no bruits. No lymphadenopathy or thryomegaly appreciated. Cor: LVAD hum.  Lungs: Clear. Abdomen:  soft, nontender, non-distended. No hepatosplenomegaly. No bruits or masses. Good bowel sounds. Driveline site clean. Anchor in place.  Extremities: no cyanosis, clubbing, rash. Warm no edema  Neuro: alert & oriented x 3. No focal deficits. Moves  all 4 without problem   No recurrent bleeding. Given h/o AV thrombosis, will start low-dose heparin 500u/hr until INR 1.8 or greater. If rebleeds will need capsule endo   MAPs ok. VAD interrogated personally. Parameters stable.  Discussed AC dosing with PharmD personally.  Arvilla Meres, MD  8:44 AM

## 2023-02-07 NOTE — Progress Notes (Signed)
ANTICOAGULATION CONSULT NOTE  Pharmacy Consult for warfarin Indication:  HM3 LVAD  No Known Allergies  Patient Measurements: Height: 6' (182.9 cm) Weight: 90.9 kg (200 lb 6.4 oz) IBW/kg (Calculated) : 77.6 Heparin Dosing Weight: 92 kg   Vital Signs: Temp: 98.3 F (36.8 C) (09/27 1512) Temp Source: Oral (09/27 1512) BP: 81/68 (09/27 1754) Pulse Rate: 81 (09/27 1512)  Labs: Recent Labs    02/05/23 1819 02/06/23 0234 02/07/23 0331 02/07/23 1153 02/07/23 1439 02/07/23 1923  HGB 10.3* 10.7* 10.8* 11.6*  --   --   HCT 33.1* 34.4* 35.1*  --   --   --   PLT 180 178 181  --   --   --   LABPROT 22.7* 20.9* 17.4*  --   --   --   INR 2.0* 1.8* 1.4*  --   --   --   HEPARINUNFRC  --   --   --   --  <0.10* <0.10*  CREATININE 0.94 0.91 0.82  --   --   --     Estimated Creatinine Clearance: 93.3 mL/min (by C-G formula based on SCr of 0.82 mg/dL).   Medical History: Past Medical History:  Diagnosis Date   AICD (automatic cardioverter/defibrillator) present 08/31/2019   Ankylosing spondylitis (HCC)    Arthritis    BENIGN PROSTATIC HYPERTROPHY 06/07/2008   CHF (congestive heart failure) (HCC)    COLONIC POLYPS, HX OF 06/07/2008   Coronary artery disease    Depression    H/O hiatal hernia    Heart failure (HCC)    History of radiation therapy    Left Lung- 08/06/22-08/16/22- Dr. Antony Blackbird   HYPERLIPIDEMIA 06/07/2008   HYPERTENSION 06/07/2008   Myocardial infarction (HCC) 2005   NSTEMI, s/p LAD stent   NEPHROLITHIASIS, HX OF 06/07/2008   STEMI (ST elevation myocardial infarction) (HCC) 04/27/2019    Medications:  Scheduled:   atorvastatin  80 mg Oral Daily   ezetimibe  10 mg Oral Daily   gabapentin  300 mg Oral TID   losartan  12.5 mg Oral Daily   metoprolol succinate  12.5 mg Oral Daily   pantoprazole  40 mg Oral BID   sertraline  50 mg Oral Daily   sodium chloride flush  3 mL Intravenous Q12H   tamsulosin  0.4 mg Oral Daily   Warfarin - Pharmacist Dosing  Inpatient   Does not apply q1600    Assessment: 3 yom with known history of HM3 LVAD 03/2021 - on warfarin PTA.  INR continued to fall to 1.4 - warfarin was restarted on 9/26. Hgb 10.8, plt 181. LDH 129. Underwent endoscopy 9/26 finding gastritis and single AVM treated with APC. No s/sx of bleeding - no further BM overnight. Given INR subtherapeutic due to holding warfarin, will restart heparin infusion at 500 units/hr for now until INR >/= 1.8.   PTA regimen is 7.5 mg daily except 5 mg every Monday.  9/27 PM Update: heparin infiltrated earlier today and infusion was held for 2 hrs, restarted @1330 .  Heparin level is undetectable on 500 units/hr.  No further infusion issues per RN.  Goal of Therapy:  Heparin level <0.3 - no titration unless elevated  INR 2.5-3  due to clotting on AoV and possible coronary embolism 12/23  Monitor platelets by anticoagulation protocol: Yes   Plan:  Warfarin 7.5 mg x1 (given) Continue heparin infusion at 500 units/hr Will monitor INR, heparin levels daily and follow GI recommendations  Thank you for allowing pharmacy to  participate in this patient's care,  Trixie Rude, PharmD Clinical Pharmacist 02/07/2023  8:21 PM

## 2023-02-07 NOTE — Progress Notes (Signed)
LVAD Coordinator Rounding Note:  Admitted 02/05/23 for symptomatic anemia and GI bleeding. Pt called VAD coordinator and reported dark stool x 7 days. Hgb 12.4 on 8/19 and dropped to 10.7 on 9/24. Hgb on admission 10.0.    HM3 LVAD implanted on 04/06/21 by Dr. Laneta Simmers under DT criteria.  9/26: EGD- AVM x 1 treated with APC  Pt sitting up in the recliner. Denies complaints. No BM overnight. Hgb stable 10.8.   INR 1.4 today. Will start Heparin drip per Dr Gala Romney. Heparin until INR 1.8.   Vital signs: Temp: 98.2 HR: 74 Doppler Pressure: 82 Automatic BP: 93/74 (82) O2 Sat: 96% on RA Wt: 199.7>200.4 lbs   LVAD interrogation reveals:  Speed: 5800 Flow: 4.4 Power: 4.7 w PI: 5.9  Alarms: none Events: 100+ (encouraged increased PO intake) Hematocrit: 35  Fixed speed: 5800 Low speed limit: 5500  Drive Line: Existing VAD dressing clean, dry, and intact. Anchor correctly applied. Next dressing change due 02/10/23 by bedside nurse.   Labs:  LDH trend: 137>129  INR trend: 1.8>1.4  Hgb trend: 10.0>10.7>10.8  Anticoagulation Plan: -INR Goal: 2.5 - 3.0 (increased 05/09/22) -ASA Dose: 81 mg daily--stopped this admission  Blood Products:   Device: N/A  Arrythmias: none this admission    Plan/Recommendations:  1. Page VAD Coordinator with any equipment of VAD concerns 2. Weekly drive line dressing change per beside RN  Alyce Pagan RN VAD Coordinator  Office: [phone]  24/7 Pager: [phone]

## 2023-02-07 NOTE — Care Management Important Message (Signed)
Important Message  Patient Details  Name: Brandon [name] MRN: [id number] Date of Birth: 1956/01/07   Important Message Given:  Yes - Medicare IM     Dorena Bodo 02/07/2023, 3:06 PM

## 2023-02-07 NOTE — Plan of Care (Signed)
  Problem: Education: Goal: Understanding of CV disease, CV risk reduction, and recovery process will improve Outcome: Progressing   Problem: Activity: Goal: Ability to return to baseline activity level will improve Outcome: Progressing   Problem: Cardiovascular: Goal: Ability to achieve and maintain adequate cardiovascular perfusion will improve Outcome: Progressing   Problem: Cardiovascular: Goal: Vascular access site(s) Level 0-1 will be maintained Outcome: Progressing   Problem: Health Behavior/Discharge Planning: Goal: Ability to safely manage health-related needs after discharge will improve Outcome: Progressing   Problem: Education: Goal: Patient will understand all VAD equipment and how it functions Outcome: Progressing   Problem: Education: Goal: Patient will be able to verbalize current INR target range and antiplatelet therapy for discharge home Outcome: Progressing   Problem: Cardiac: Goal: LVAD will function as expected and patient will experience no clinical alarms Outcome: Progressing   Problem: Education: Goal: Knowledge of General Education information will improve Description: Including pain rating scale, medication(s)/side effects and non-pharmacologic comfort measures Outcome: Progressing   Problem: Health Behavior/Discharge Planning: Goal: Ability to manage health-related needs will improve Outcome: Progressing   Problem: Clinical Measurements: Goal: Ability to maintain clinical measurements within normal limits will improve Outcome: Progressing   Problem: Clinical Measurements: Goal: Will remain free from infection Outcome: Progressing   Problem: Clinical Measurements: Goal: Diagnostic test results will improve Outcome: Progressing   Problem: Clinical Measurements: Goal: Respiratory complications will improve Outcome: Progressing   Problem: Clinical Measurements: Goal: Cardiovascular complication will be avoided Outcome: Progressing    Problem: Activity: Goal: Risk for activity intolerance will decrease Outcome: Progressing   Problem: Nutrition: Goal: Adequate nutrition will be maintained Outcome: Progressing   Problem: Coping: Goal: Level of anxiety will decrease Outcome: Progressing   Problem: Elimination: Goal: Will not experience complications related to bowel motility Outcome: Progressing   Problem: Elimination: Goal: Will not experience complications related to urinary retention Outcome: Progressing   Problem: Pain Managment: Goal: General experience of comfort will improve Outcome: Progressing   Problem: Safety: Goal: Ability to remain free from injury will improve Outcome: Progressing   Problem: Skin Integrity: Goal: Risk for impaired skin integrity will decrease Outcome: Progressing

## 2023-02-07 NOTE — Progress Notes (Signed)
ANTICOAGULATION CONSULT NOTE  Pharmacy Consult for warfarin Indication:  HM3 LVAD  No Known Allergies  Patient Measurements: Height: 6' (182.9 cm) Weight: 90.9 kg (200 lb 6.4 oz) IBW/kg (Calculated) : 77.6 Heparin Dosing Weight: 92 kg   Vital Signs: Temp: 98.2 F (36.8 C) (09/27 0743) Temp Source: Oral (09/27 0743) BP: 103/85 (09/27 0743) Pulse Rate: 62 (09/27 0743)  Labs: Recent Labs    02/05/23 1819 02/06/23 0234 02/07/23 0331  HGB 10.3* 10.7* 10.8*  HCT 33.1* 34.4* 35.1*  PLT 180 178 181  LABPROT 22.7* 20.9* 17.4*  INR 2.0* 1.8* 1.4*  CREATININE 0.94 0.91 0.82    Estimated Creatinine Clearance: 93.3 mL/min (by C-G formula based on SCr of 0.82 mg/dL).   Medical History: Past Medical History:  Diagnosis Date   AICD (automatic cardioverter/defibrillator) present 08/31/2019   Ankylosing spondylitis (HCC)    Arthritis    BENIGN PROSTATIC HYPERTROPHY 06/07/2008   CHF (congestive heart failure) (HCC)    COLONIC POLYPS, HX OF 06/07/2008   Coronary artery disease    Depression    H/O hiatal hernia    Heart failure (HCC)    History of radiation therapy    Left Lung- 08/06/22-08/16/22- Dr. Antony Blackbird   HYPERLIPIDEMIA 06/07/2008   HYPERTENSION 06/07/2008   Myocardial infarction (HCC) 2005   NSTEMI, s/p LAD stent   NEPHROLITHIASIS, HX OF 06/07/2008   STEMI (ST elevation myocardial infarction) (HCC) 04/27/2019    Medications:  Scheduled:   atorvastatin  80 mg Oral Daily   ezetimibe  10 mg Oral Daily   gabapentin  300 mg Oral TID   losartan  12.5 mg Oral Daily   metoprolol succinate  12.5 mg Oral Daily   pantoprazole  40 mg Oral BID   sertraline  50 mg Oral Daily   sodium chloride flush  3 mL Intravenous Q12H   tamsulosin  0.4 mg Oral Daily   Warfarin - Pharmacist Dosing Inpatient   Does not apply q1600    Assessment: 11 yom with known history of HM3 LVAD 03/2021 - on warfarin PTA.  INR continued to fall to 1.4 - warfarin was restarted on 9/26. Hgb  10.8, plt 181. LDH 129. Underwent endoscopy 9/26 finding gastritis and single AVM treated with APC. No s/sx of bleeding - no further BM overnight. Given INR subtherapeutic due to holding warfarin, will restart heparin infusion at 500 units/hr for now until INR >/= 1.8.   PTA regimen is 7.5 mg daily except 5 mg every Monday.  Goal of Therapy:  Heparin level <0.3 - no titration unless elevated  INR 2.5-3  due to clotting on AoV and possible coronary embolism 12/23  Monitor platelets by anticoagulation protocol: Yes   Plan:  Order warfarin 7.5 mg tonight Start heparin infusion at 500 units/hr >> 6 hr HL to make sure level not too elevated  Will monitor INR and follow GI recommendations  Thank you for allowing pharmacy to participate in this patient's care,  Sherron Monday, PharmD, BCCCP Clinical Pharmacist  Phone: [phone] 02/07/2023 7:55 AM  Please check AMION for all [facility] Pharmacy phone numbers After 10:00 PM, call Main Pharmacy [phone]

## 2023-02-08 DIAGNOSIS — D649 Anemia, unspecified: Secondary | ICD-10-CM | POA: Diagnosis not present

## 2023-02-08 LAB — CBC
HCT: 34.7 % — ABNORMAL LOW (ref 39.0–52.0)
Hemoglobin: 10.7 g/dL — ABNORMAL LOW (ref 13.0–17.0)
MCH: 25.7 pg — ABNORMAL LOW (ref 26.0–34.0)
MCHC: 30.8 g/dL (ref 30.0–36.0)
MCV: 83.4 fL (ref 80.0–100.0)
Platelets: 176 10*3/uL (ref 150–400)
RBC: 4.16 MIL/uL — ABNORMAL LOW (ref 4.22–5.81)
RDW: 18.7 % — ABNORMAL HIGH (ref 11.5–15.5)
WBC: 7.5 10*3/uL (ref 4.0–10.5)
nRBC: 0 % (ref 0.0–0.2)

## 2023-02-08 LAB — BASIC METABOLIC PANEL
Anion gap: 10 (ref 5–15)
BUN: 14 mg/dL (ref 8–23)
CO2: 24 mmol/L (ref 22–32)
Calcium: 8.6 mg/dL — ABNORMAL LOW (ref 8.9–10.3)
Chloride: 103 mmol/L (ref 98–111)
Creatinine, Ser: 0.96 mg/dL (ref 0.61–1.24)
GFR, Estimated: >60 mL/min (ref >60)
Glucose, Bld: 110 mg/dL — ABNORMAL HIGH (ref 70–99)
Potassium: 4 mmol/L (ref 3.5–5.1)
Sodium: 137 mmol/L (ref 135–145)

## 2023-02-08 LAB — PROTIME-INR
INR: 1.5 — ABNORMAL HIGH (ref 0.8–1.2)
Prothrombin Time: 18.1 s — ABNORMAL HIGH (ref 11.4–15.2)

## 2023-02-08 LAB — GLUCOSE, CAPILLARY: Glucose-Capillary: 103 mg/dL — ABNORMAL HIGH (ref 70–99)

## 2023-02-08 LAB — HEPARIN LEVEL (UNFRACTIONATED): Heparin Unfractionated: <0.1 [IU]/mL — ABNORMAL LOW (ref 0.30–0.70)

## 2023-02-08 LAB — LACTATE DEHYDROGENASE: LDH: 136 U/L (ref 98–192)

## 2023-02-08 MED ORDER — MELATONIN 5 MG PO TABS
10.0000 mg | ORAL_TABLET | Freq: Every evening | ORAL | Status: DC | PRN
  Administered 2023-02-08 – 2023-02-09 (×2): 10 mg via ORAL
  Filled 2023-02-08 (×2): qty 2

## 2023-02-08 MED ORDER — WARFARIN SODIUM 7.5 MG PO TABS
7.5000 mg | ORAL_TABLET | Freq: Once | ORAL | Status: AC
  Administered 2023-02-08: 7.5 mg via ORAL
  Filled 2023-02-08: qty 1

## 2023-02-08 NOTE — Progress Notes (Signed)
Patient had a 13 beat run of VT while speaking to Dr. Gasper Lloyd. Patient denied any chest pain or palpations. Provider at bedside. Will continue to monitor patient.

## 2023-02-08 NOTE — Progress Notes (Signed)
ANTICOAGULATION CONSULT NOTE  Pharmacy Consult for warfarin Indication:  HM3 LVAD  No Known Allergies  Patient Measurements: Height: 6' (182.9 cm) Weight: 88.5 kg (195 lb 1.7 oz) IBW/kg (Calculated) : 77.6 Heparin Dosing Weight: 92 kg   Vital Signs: Temp: 97.7 F (36.5 C) (09/28 0412) Temp Source: Oral (09/28 0412) BP: 88/65 (09/28 0412) Pulse Rate: 55 (09/28 0012)  Labs: Recent Labs    02/06/23 0234 02/07/23 0331 02/07/23 1153 02/07/23 1439 02/07/23 1923 02/08/23 0219  HGB 10.7* 10.8* 11.6*  --   --  10.7*  HCT 34.4* 35.1*  --   --   --  34.7*  PLT 178 181  --   --   --  176  LABPROT 20.9* 17.4*  --   --   --  18.1*  INR 1.8* 1.4*  --   --   --  1.5*  HEPARINUNFRC  --   --   --  <0.10* <0.10* <0.10*  CREATININE 0.91 0.82  --   --   --  0.96    Estimated Creatinine Clearance: 79.7 mL/min (by C-G formula based on SCr of 0.96 mg/dL).   Medical History: Past Medical History:  Diagnosis Date   AICD (automatic cardioverter/defibrillator) present 08/31/2019   Ankylosing spondylitis (HCC)    Arthritis    BENIGN PROSTATIC HYPERTROPHY 06/07/2008   CHF (congestive heart failure) (HCC)    COLONIC POLYPS, HX OF 06/07/2008   Coronary artery disease    Depression    H/O hiatal hernia    Heart failure (HCC)    History of radiation therapy    Left Lung- 08/06/22-08/16/22- Dr. Antony Blackbird   HYPERLIPIDEMIA 06/07/2008   HYPERTENSION 06/07/2008   Myocardial infarction (HCC) 2005   NSTEMI, s/p LAD stent   NEPHROLITHIASIS, HX OF 06/07/2008   STEMI (ST elevation myocardial infarction) (HCC) 04/27/2019    Medications:  Scheduled:   atorvastatin  80 mg Oral Daily   ezetimibe  10 mg Oral Daily   gabapentin  300 mg Oral TID   losartan  12.5 mg Oral Daily   metoprolol succinate  12.5 mg Oral Daily   pantoprazole  40 mg Oral BID   sertraline  50 mg Oral Daily   sodium chloride flush  3 mL Intravenous Q12H   tamsulosin  0.4 mg Oral Daily   warfarin  7.5 mg Oral ONCE-1600    Warfarin - Pharmacist Dosing Inpatient   Does not apply q1600    Assessment: 70 yom with known history of HM3 LVAD 03/2021 - on warfarin PTA.  INR increased to 1.5 (+0.1) - warfarin was restarted on 9/26.CBC and LDH stable. Underwent endoscopy 9/26 finding gastritis and single AVM treated with APC. No s/sx of bleeding. Given INR subtherapeutic due to holding warfarin, will restart heparin infusion at 500 units/hr for now until INR >/= 1.8.   PTA regimen is 7.5 mg daily except 5 mg every Monday.  9/27 PM Update: heparin infiltrated earlier today and infusion was held for 2 hrs, restarted @1330 .  Heparin level is undetectable on 500 units/hr.  No further infusion issues per RN.  Goal of Therapy:  Heparin level <0.3 - no titration unless elevated  INR 2.5-3  due to clotting on AoV and possible coronary embolism 12/23  Monitor platelets by anticoagulation protocol: Yes   Plan:  Warfarin 7.5 mg x1 (given) Continue heparin infusion at 500 units/hr Will monitor INR, heparin levels daily and follow GI recommendations  Thank you for allowing pharmacy to participate in this  patient's care,  Wilmer Floor, PharmD PGY2 Cardiology Pharmacy Resident 02/08/2023  6:29 AM

## 2023-02-08 NOTE — Progress Notes (Signed)
Advanced Heart Failure VAD Team Note  PCP-Cardiologist: Olga Millers, MD   Subjective:    9/25: admitted w/ symptomatic anemia and GIB. Hgb 10 9/26: s/p APC treatment of AVM.   INR 1.5 Hgb 10.7  Denies SOB. No BM overnight.    LVAD INTERROGATION:  HeartMate III LVAD:   Flow 4.5 liters/min, speed 5800, power 4.8, PI 6.1  > multiple PI events.    Objective:    Vital Signs:   Temp:  [97.7 F (36.5 C)-98.4 F (36.9 C)] 98 F (36.7 C) (09/28 1034) Pulse Rate:  [55-81] 80 (09/28 1034) Resp:  [15-18] 16 (09/28 0412) BP: (80-104)/(65-80) 89/76 (09/28 1034) SpO2:  [94 %-100 %] 94 % (09/28 0412) Weight:  [88.5 kg] 88.5 kg (09/28 0412) Last BM Date : 02/07/23 Mean arterial Pressure 80s   Intake/Output:   Intake/Output Summary (Last 24 hours) at 02/08/2023 1347 Last data filed at 02/08/2023 1038 Gross per 24 hour  Intake 623.95 ml  Output 1400 ml  Net -776.05 ml     Physical Exam    Physical Exam: GENERAL: No acute distress. HEENT: normal  NECK: Supple, JVP flat .  2+ bilaterally, no bruits.  No lymphadenopathy or thyromegaly appreciated.   CARDIAC:  Mechanical heart sounds with LVAD hum present.  LUNGS:  Clear to auscultation bilaterally.  ABDOMEN:  Soft, round, nontender, positive bowel sounds x4.     LVAD exit site:   Dressing dry and intact.  No erythema or drainage.  Stabilization device present and accurately applied.  Driveline dressing is being changed daily per sterile technique. EXTREMITIES:  Warm and dry, no cyanosis, clubbing, rash or edema  NEUROLOGIC:  Alert and oriented x 3.    No aphasia.  No dysarthria.  Affect pleasant.      Telemetry   Artifact Barostim/HMIII VAD. Looks like SR.   EKG    N/A  Labs   Basic Metabolic Panel: Recent Labs  Lab 02/05/23 1819 02/06/23 0233 02/06/23 0234 02/07/23 0331 02/08/23 0219  NA 135  --  138 138 137  K 4.2  --  4.0 3.9 4.0  CL 106  --  104 105 103  CO2 24  --  27 25 24   GLUCOSE 88  --  95 103*  110*  BUN 16  --  12 11 14   CREATININE 0.94  --  0.91 0.82 0.96  CALCIUM 8.7*  --  8.7* 8.8* 8.6*  MG  --  2.0  --   --   --     Liver Function Tests: No results for input(s): "AST", "ALT", "ALKPHOS", "BILITOT", "PROT", "ALBUMIN" in the last 168 hours. No results for input(s): "LIPASE", "AMYLASE" in the last 168 hours. No results for input(s): "AMMONIA" in the last 168 hours.  CBC: Recent Labs  Lab 02/04/23 0852 02/05/23 1819 02/06/23 0234 02/07/23 0331 02/07/23 1153 02/08/23 0219  WBC 6.9 6.8 6.5 7.2  --  7.5  HGB 10.7* 10.3* 10.7* 10.8* 11.6* 10.7*  HCT 35.0* 33.1* 34.4* 35.1*  --  34.7*  MCV 82.0 81.5 81.3 81.3  --  83.4  PLT 180 180 178 181  --  176    INR: Recent Labs  Lab 02/04/23 0852 02/05/23 1819 02/06/23 0234 02/07/23 0331 02/08/23 0219  INR 2.7* 2.0* 1.8* 1.4* 1.5*    Other results:   Imaging   No results found.   Medications:     Scheduled Medications:  atorvastatin  80 mg Oral Daily   ezetimibe  10 mg Oral  Daily   gabapentin  300 mg Oral TID   losartan  12.5 mg Oral Daily   metoprolol succinate  12.5 mg Oral Daily   pantoprazole  40 mg Oral BID   sertraline  50 mg Oral Daily   sodium chloride flush  3 mL Intravenous Q12H   tamsulosin  0.4 mg Oral Daily   warfarin  7.5 mg Oral ONCE-1600   Warfarin - Pharmacist Dosing Inpatient   Does not apply q1600    Infusions:  sodium chloride     heparin 500 Units/hr (02/07/23 1330)    PRN Medications: sodium chloride, acetaminophen, ondansetron (ZOFRAN) IV   Patient Profile   Brandon [name] is a 67 year old with a history of  tobacco use, CAD s/p previous anterior MI, severe systolic HF s/p HM-3 VAD 04/06/21, barostim, severe AS s/p AVR, endocarditis.    Admitted with symptomatic anemia.  Assessment/Plan:    1. Symptomatic Anemia  -Prior to admit melena x 7 days and developed concerning symptoms. Lightheaded.  - 9/26 s/p APC treatment of AVM.  -Hgb stable 10.7  -Continue Protonix 40 bid   - No BM overnight.   2. Chronic HFrEF, ICU  -Echo 05/19/20 EF 20-25% RV mildly HK. moderate AS  Mean gradient 13 AVA 1.2 cm2 DI 0.30. -Echo 12/30/22: LVEF < 20% LVIDd 6.1cm. Cannula well placed. RV mildly HK. AoV not opening. No AI.  - s/p HM-III VAD + bioprosthetic AVR 04/06/21 - Appears euvolemic  - Continue current dose to Toprol XL and Losartan.    3. HMIII LVAD, DT  -Hold ASA and coumadin.  - INR 1.5 today; continue heparin for the time being. - Goal INR 1.8   4. VT/VF -Most recent 12/20223   5. CAD  - s/p anterior STEMI (12/20). LHC showed chronically occluded RCA (with L>>R collaterals) and thrombotic occlusion of proximal LAD. Underwent PCI of LAD. - Recurrent anterior STEMI in 12/23 Cath 12/23 LAD stent patent. Felt to be embolic from aortic valve - Continue atorvastatin 80 mg daily    6. PAF - He has been off amio. Maintaining SR.    7. Lung Cancer Completed radiation  I reviewed the LVAD parameters from today, and compared the results to the patient's prior recorded data.  No programming changes were made.  The LVAD is functioning within specified parameters.  The patient performs LVAD self-test daily.  LVAD interrogation was negative for any significant power changes, alarms or PI events/speed drops.  LVAD equipment check completed and is in good working order.  Back-up equipment present.   LVAD education done on emergency procedures and precautions and reviewed exit site care.  Length of Stay: 3  Fermon Ureta, DO 02/08/2023, 1:47 PM  VAD Team --- VAD ISSUES ONLY--- Pager [phone] (7am - 7am)  Advanced Heart Failure Team  Pager [phone] (M-F; 7a - 5p)  Please contact [facility] for night-coverage after hours (5p -7a ) and weekends on [email or web address]

## 2023-02-09 ENCOUNTER — Encounter (HOSPITAL_COMMUNITY): Payer: Self-pay | Admitting: Internal Medicine

## 2023-02-09 DIAGNOSIS — D649 Anemia, unspecified: Secondary | ICD-10-CM | POA: Diagnosis not present

## 2023-02-09 DIAGNOSIS — I5022 Chronic systolic (congestive) heart failure: Secondary | ICD-10-CM | POA: Diagnosis not present

## 2023-02-09 LAB — CBC
HCT: 33.7 % — ABNORMAL LOW (ref 39.0–52.0)
Hemoglobin: 10.4 g/dL — ABNORMAL LOW (ref 13.0–17.0)
MCH: 25.9 pg — ABNORMAL LOW (ref 26.0–34.0)
MCHC: 30.9 g/dL (ref 30.0–36.0)
MCV: 83.8 fL (ref 80.0–100.0)
Platelets: 175 10*3/uL (ref 150–400)
RBC: 4.02 MIL/uL — ABNORMAL LOW (ref 4.22–5.81)
RDW: 18.7 % — ABNORMAL HIGH (ref 11.5–15.5)
WBC: 6.4 10*3/uL (ref 4.0–10.5)
nRBC: 0 % (ref 0.0–0.2)

## 2023-02-09 LAB — HEPARIN LEVEL (UNFRACTIONATED): Heparin Unfractionated: <0.1 [IU]/mL — ABNORMAL LOW (ref 0.30–0.70)

## 2023-02-09 LAB — BASIC METABOLIC PANEL
Anion gap: 10 (ref 5–15)
BUN: 16 mg/dL (ref 8–23)
CO2: 25 mmol/L (ref 22–32)
Calcium: 8.9 mg/dL (ref 8.9–10.3)
Chloride: 104 mmol/L (ref 98–111)
Creatinine, Ser: 0.97 mg/dL (ref 0.61–1.24)
GFR, Estimated: >60 mL/min (ref >60)
Glucose, Bld: 113 mg/dL — ABNORMAL HIGH (ref 70–99)
Potassium: 4 mmol/L (ref 3.5–5.1)
Sodium: 139 mmol/L (ref 135–145)

## 2023-02-09 LAB — PROTIME-INR
INR: 1.5 — ABNORMAL HIGH (ref 0.8–1.2)
Prothrombin Time: 18.7 s — ABNORMAL HIGH (ref 11.4–15.2)

## 2023-02-09 LAB — LACTATE DEHYDROGENASE: LDH: 137 U/L (ref 98–192)

## 2023-02-09 MED ORDER — LACTATED RINGERS IV BOLUS
500.0000 mL | Freq: Once | INTRAVENOUS | Status: AC
  Administered 2023-02-09: 500 mL via INTRAVENOUS

## 2023-02-09 MED ORDER — WARFARIN SODIUM 5 MG PO TABS
10.0000 mg | ORAL_TABLET | Freq: Once | ORAL | Status: AC
  Administered 2023-02-09: 10 mg via ORAL
  Filled 2023-02-09: qty 2

## 2023-02-09 MED ORDER — POLYETHYLENE GLYCOL 3350 17 G PO PACK: 17.0000 g | PACK | Freq: Every day | ORAL | Status: DC | PRN

## 2023-02-09 MED ORDER — SENNOSIDES-DOCUSATE SODIUM 8.6-50 MG PO TABS: 1.0000 | ORAL_TABLET | Freq: Every evening | ORAL | Status: DC | PRN

## 2023-02-09 MED ORDER — MAGNESIUM SULFATE 2 GM/50ML IV SOLN
2.0000 g | Freq: Once | INTRAVENOUS | Status: AC
  Administered 2023-02-09: 2 g via INTRAVENOUS
  Filled 2023-02-09: qty 50

## 2023-02-09 MED ORDER — WARFARIN SODIUM 7.5 MG PO TABS: 7.5000 mg | ORAL_TABLET | Freq: Once | ORAL | Status: DC

## 2023-02-09 MED ORDER — LACTATED RINGERS IV BOLUS: 500.0000 mL | Freq: Once | INTRAVENOUS | Status: DC

## 2023-02-09 NOTE — Plan of Care (Signed)
  Problem: Education: Goal: Understanding of CV disease, CV risk reduction, and recovery process will improve Outcome: Progressing   Problem: Health Behavior/Discharge Planning: Goal: Ability to safely manage health-related needs after discharge will improve Outcome: Progressing   Problem: Education: Goal: Patient will be able to verbalize current INR target range and antiplatelet therapy for discharge home Outcome: Progressing   Problem: Education: Goal: Knowledge of General Education information will improve Description: Including pain rating scale, medication(s)/side effects and non-pharmacologic comfort measures Outcome: Progressing   Problem: Health Behavior/Discharge Planning: Goal: Ability to manage health-related needs will improve Outcome: Progressing   Problem: Clinical Measurements: Goal: Will remain free from infection Outcome: Progressing Goal: Diagnostic test results will improve Outcome: Progressing Goal: Respiratory complications will improve Outcome: Progressing   Problem: Activity: Goal: Risk for activity intolerance will decrease Outcome: Progressing   Problem: Nutrition: Goal: Adequate nutrition will be maintained Outcome: Progressing

## 2023-02-09 NOTE — Progress Notes (Signed)
ANTICOAGULATION CONSULT NOTE  Pharmacy Consult for warfarin Indication:  HM3 LVAD  No Known Allergies  Patient Measurements: Height: 6' (182.9 cm) Weight: 91.4 kg (201 lb 8 oz) IBW/kg (Calculated) : 77.6 Heparin Dosing Weight: 92 kg   Vital Signs: Temp: 97.7 F (36.5 C) (09/29 0417) Temp Source: Oral (09/29 0417) BP: 88/73 (09/29 0417) Pulse Rate: 85 (09/29 0417)  Labs: Recent Labs    02/07/23 0331 02/07/23 1153 02/07/23 1923 02/08/23 0219 02/09/23 0239  HGB 10.8*   < >  --  10.7* 10.4*  HCT 35.1*  --   --  34.7* 33.7*  PLT 181  --   --  176 175  LABPROT 17.4*  --   --  18.1* 18.7*  INR 1.4*  --   --  1.5* 1.5*  HEPARINUNFRC  --    < > <0.10* <0.10* <0.10*  CREATININE 0.82  --   --  0.96 0.97   < > = values in this interval not displayed.    Estimated Creatinine Clearance: 78.9 mL/min (by C-G formula based on SCr of 0.97 mg/dL).   Medical History: Past Medical History:  Diagnosis Date   AICD (automatic cardioverter/defibrillator) present 08/31/2019   Ankylosing spondylitis (HCC)    Arthritis    BENIGN PROSTATIC HYPERTROPHY 06/07/2008   CHF (congestive heart failure) (HCC)    COLONIC POLYPS, HX OF 06/07/2008   Coronary artery disease    Depression    H/O hiatal hernia    Heart failure (HCC)    History of radiation therapy    Left Lung- 08/06/22-08/16/22- Dr. Antony Blackbird   HYPERLIPIDEMIA 06/07/2008   HYPERTENSION 06/07/2008   Myocardial infarction (HCC) 2005   NSTEMI, s/p LAD stent   NEPHROLITHIASIS, HX OF 06/07/2008   STEMI (ST elevation myocardial infarction) (HCC) 04/27/2019    Medications:  Scheduled:   atorvastatin  80 mg Oral Daily   ezetimibe  10 mg Oral Daily   gabapentin  300 mg Oral TID   losartan  12.5 mg Oral Daily   metoprolol succinate  12.5 mg Oral Daily   pantoprazole  40 mg Oral BID   sertraline  50 mg Oral Daily   sodium chloride flush  3 mL Intravenous Q12H   tamsulosin  0.4 mg Oral Daily   warfarin  7.5 mg Oral ONCE-1600    Warfarin - Pharmacist Dosing Inpatient   Does not apply q1600    Assessment: 63 yom with known history of HM3 LVAD 03/2021 - on warfarin PTA.  INR remains at 1.5 - warfarin was restarted on 9/26.CBC and LDH stable. Underwent endoscopy 9/26 finding gastritis and single AVM treated with APC. No s/sx of bleeding. Given INR subtherapeutic due to holding warfarin, will restart heparin infusion at 500 units/hr for now until INR >/= 1.8 and give a booster dose of warfarin today.  PTA regimen is 7.5 mg daily except 5 mg every Monday.  9/27 PM Update: heparin infiltrated earlier today and infusion was held for 2 hrs, restarted @1330 .  Heparin level is undetectable on 500 units/hr.  No further infusion issues per RN.  Goal of Therapy:  Heparin level <0.3 - no titration unless elevated  INR 2.5-3  due to clotting on AoV and possible coronary embolism 12/23  Monitor platelets by anticoagulation protocol: Yes   Plan:  Warfarin 10 mg x1 Continue heparin infusion at 500 units/hr Will monitor INR, heparin levels daily and follow GI recommendations  Thank you for allowing pharmacy to participate in this patient's care,  Wilmer Floor, PharmD PGY2 Cardiology Pharmacy Resident 02/09/2023  6:20 AM

## 2023-02-09 NOTE — Progress Notes (Signed)
Advanced Heart Failure VAD Team Note  PCP-Cardiologist: Olga Millers, MD   Subjective:    9/25: admitted w/ symptomatic anemia and GIB. Hgb 10 9/26: s/p APC treatment of AVM.   INR 1.5 Hgb 10.7  No SOB; doing very well; no complaints.   LVAD INTERROGATION:  HeartMate III LVAD:   Flow 4.4 liters/min, speed 5800, power 4.8, PI 5  > multiple PI events.    Objective:    Vital Signs:   Temp:  [97.3 F (36.3 C)-98.4 F (36.9 C)] 97.3 F (36.3 C) (09/29 0717) Pulse Rate:  [65-94] 94 (09/29 0717) Resp:  [15-21] 18 (09/29 0742) BP: (82-110)/(65-96) 110/96 (09/29 0743) SpO2:  [98 %] 98 % (09/28 1939) Weight:  [91.4 kg] 91.4 kg (09/29 0417) Last BM Date : 02/07/23 Mean arterial Pressure 80s   Intake/Output:   Intake/Output Summary (Last 24 hours) at 02/09/2023 1057 Last data filed at 02/09/2023 0415 Gross per 24 hour  Intake --  Output 1700 ml  Net -1700 ml     Physical Exam    Physical Exam: GENERAL: No acute distress. HEENT: normal  NECK: Supple, JVP flat .  2+ bilaterally, no bruits.  No lymphadenopathy or thyromegaly appreciated.   CARDIAC:  Mechanical heart sounds with LVAD hum present.  LUNGS:  Clear to auscultation bilaterally.  ABDOMEN:  Soft, round, nontender, positive bowel sounds x4.     LVAD exit site:   Dressing dry and intact.  No erythema or drainage.  Stabilization device present and accurately applied.  Driveline dressing is being changed daily per sterile technique. EXTREMITIES:  Warm and dry, no cyanosis, clubbing, rash or edema  NEUROLOGIC:  Alert and oriented x 3.    No aphasia.  No dysarthria.  Affect pleasant.      Telemetry   Artifact Barostim/HMIII VAD. Looks like SR.   EKG    N/A  Labs   Basic Metabolic Panel: Recent Labs  Lab 02/05/23 1819 02/06/23 0233 02/06/23 0234 02/07/23 0331 02/08/23 0219 02/09/23 0239  NA 135  --  138 138 137 139  K 4.2  --  4.0 3.9 4.0 4.0  CL 106  --  104 105 103 104  CO2 24  --  27 25 24 25    GLUCOSE 88  --  95 103* 110* 113*  BUN 16  --  12 11 14 16   CREATININE 0.94  --  0.91 0.82 0.96 0.97  CALCIUM 8.7*  --  8.7* 8.8* 8.6* 8.9  MG  --  2.0  --   --   --   --     CBC: Recent Labs  Lab 02/05/23 1819 02/06/23 0234 02/07/23 0331 02/07/23 1153 02/08/23 0219 02/09/23 0239  WBC 6.8 6.5 7.2  --  7.5 6.4  HGB 10.3* 10.7* 10.8* 11.6* 10.7* 10.4*  HCT 33.1* 34.4* 35.1*  --  34.7* 33.7*  MCV 81.5 81.3 81.3  --  83.4 83.8  PLT 180 178 181  --  176 175    INR: Recent Labs  Lab 02/05/23 1819 02/06/23 0234 02/07/23 0331 02/08/23 0219 02/09/23 0239  INR 2.0* 1.8* 1.4* 1.5* 1.5*    Other results:   Imaging   No results found.   Medications:     Scheduled Medications:  atorvastatin  80 mg Oral Daily   ezetimibe  10 mg Oral Daily   gabapentin  300 mg Oral TID   losartan  12.5 mg Oral Daily   metoprolol succinate  12.5 mg Oral Daily  pantoprazole  40 mg Oral BID   sertraline  50 mg Oral Daily   sodium chloride flush  3 mL Intravenous Q12H   tamsulosin  0.4 mg Oral Daily   warfarin  10 mg Oral ONCE-1600   Warfarin - Pharmacist Dosing Inpatient   Does not apply q1600    Infusions:  sodium chloride     heparin 500 Units/hr (02/09/23 0756)    PRN Medications: sodium chloride, acetaminophen, melatonin, ondansetron (ZOFRAN) IV, polyethylene glycol, senna-docusate   Patient Profile   Brandon [name] is a 67 year old with a history of  tobacco use, CAD s/p previous anterior MI, severe systolic HF s/p HM-3 VAD 04/06/21, barostim, severe AS s/p AVR, endocarditis.    Admitted with symptomatic anemia.  Assessment/Plan:    1. Symptomatic Anemia  -Prior to admit melena x 7 days and developed concerning symptoms. Lightheaded.  - 9/26 s/p APC treatment of AVM.  -Continue Protonix 40 bid  - Hgb remains stable; 10.4 today.  - No BM yet  2. Chronic HFrEF, ICU  -Echo 05/19/20 EF 20-25% RV mildly HK. moderate AS  Mean gradient 13 AVA 1.2 cm2 DI 0.30. -Echo 12/30/22:  LVEF < 20% LVIDd 6.1cm. Cannula well placed. RV mildly HK. AoV not opening. No AI.  - s/p HM-III VAD + bioprosthetic AVR 04/06/21 - Appears euvolemic  - Continue current dose to Toprol XL and Losartan.    3. HMIII LVAD, DT  -Hold ASA and coumadin.  - INR 1.5 today; continue heparin for the time being. - Warfarin 10mg  today; discussed with pharmD.  - Goal INR 1.8   4. VT/VF -Most recent 12/20223   5. CAD  - s/p anterior STEMI (12/20). LHC showed chronically occluded RCA (with L>>R collaterals) and thrombotic occlusion of proximal LAD. Underwent PCI of LAD. - Recurrent anterior STEMI in 12/23 Cath 12/23 LAD stent patent. Felt to be embolic from aortic valve - Continue atorvastatin 80 mg daily    6. PAF - He has been off amio. Maintaining SR.    7. Lung Cancer Completed radiation  I reviewed the LVAD parameters from today, and compared the results to the patient's prior recorded data.  No programming changes were made.  The LVAD is functioning within specified parameters.  The patient performs LVAD self-test daily.  LVAD interrogation was negative for any significant power changes, alarms or PI events/speed drops.  LVAD equipment check completed and is in good working order.  Back-up equipment present.   LVAD education done on emergency procedures and precautions and reviewed exit site care.  Length of Stay: 4  Brandon Wierzba, DO 02/09/2023, 10:57 AM  VAD Team --- VAD ISSUES ONLY--- Pager [phone] (7am - 7am)  Advanced Heart Failure Team  Pager [phone] (M-F; 7a - 5p)  Please contact [facility] for night-coverage after hours (5p -7a ) and weekends on [email or web address]

## 2023-02-09 NOTE — Progress Notes (Addendum)
Tele called and stated that patient had a 12 beat run of VT, patient sitting in chair eating dinner states he feels fine, no chest pain or palpitations. Respiratory rate is regular, unlabored with symmetrical chest expansion. Will continue to monitor

## 2023-02-10 ENCOUNTER — Inpatient Hospital Stay (HOSPITAL_COMMUNITY): Payer: Medicare Other

## 2023-02-10 DIAGNOSIS — Z95811 Presence of heart assist device: Secondary | ICD-10-CM | POA: Diagnosis not present

## 2023-02-10 DIAGNOSIS — D649 Anemia, unspecified: Secondary | ICD-10-CM | POA: Diagnosis not present

## 2023-02-10 LAB — BASIC METABOLIC PANEL
Anion gap: 6 (ref 5–15)
BUN: 11 mg/dL (ref 8–23)
CO2: 26 mmol/L (ref 22–32)
Calcium: 8.5 mg/dL — ABNORMAL LOW (ref 8.9–10.3)
Chloride: 106 mmol/L (ref 98–111)
Creatinine, Ser: 0.89 mg/dL (ref 0.61–1.24)
GFR, Estimated: >60 mL/min (ref >60)
Glucose, Bld: 107 mg/dL — ABNORMAL HIGH (ref 70–99)
Potassium: 3.9 mmol/L (ref 3.5–5.1)
Sodium: 138 mmol/L (ref 135–145)

## 2023-02-10 LAB — MAGNESIUM: Magnesium: 2.2 mg/dL (ref 1.7–2.4)

## 2023-02-10 LAB — CBC
HCT: 31.3 % — ABNORMAL LOW (ref 39.0–52.0)
Hemoglobin: 9.9 g/dL — ABNORMAL LOW (ref 13.0–17.0)
MCH: 26.4 pg (ref 26.0–34.0)
MCHC: 31.6 g/dL (ref 30.0–36.0)
MCV: 83.5 fL (ref 80.0–100.0)
Platelets: 153 10*3/uL (ref 150–400)
RBC: 3.75 MIL/uL — ABNORMAL LOW (ref 4.22–5.81)
RDW: 18.6 % — ABNORMAL HIGH (ref 11.5–15.5)
WBC: 7.1 10*3/uL (ref 4.0–10.5)
nRBC: 0 % (ref 0.0–0.2)

## 2023-02-10 LAB — LACTATE DEHYDROGENASE: LDH: 125 U/L (ref 98–192)

## 2023-02-10 LAB — ECHOCARDIOGRAM LIMITED
Est EF: <20
Height: 72 in
Weight: 3234.59 [oz_av]

## 2023-02-10 LAB — PROTIME-INR
INR: 1.9 — ABNORMAL HIGH (ref 0.8–1.2)
Prothrombin Time: 21.7 s — ABNORMAL HIGH (ref 11.4–15.2)

## 2023-02-10 LAB — HEPARIN LEVEL (UNFRACTIONATED): Heparin Unfractionated: <0.1 [IU]/mL — ABNORMAL LOW (ref 0.30–0.70)

## 2023-02-10 MED ORDER — METOPROLOL SUCCINATE ER 25 MG PO TB24
25.0000 mg | ORAL_TABLET | Freq: Every day | ORAL | Status: DC
  Administered 2023-02-10: 25 mg via ORAL
  Filled 2023-02-10: qty 1

## 2023-02-10 MED ORDER — EPLERENONE 25 MG PO TABS: 25.0000 mg | ORAL_TABLET | Freq: Every morning | ORAL | 5 refills | Status: DC

## 2023-02-10 MED ORDER — METOPROLOL SUCCINATE ER 25 MG PO TB24: 25.0000 mg | ORAL_TABLET | Freq: Every day | ORAL | 3 refills | Status: DC

## 2023-02-10 MED ORDER — POTASSIUM CHLORIDE CRYS ER 20 MEQ PO TBCR: EXTENDED_RELEASE_TABLET | ORAL | Status: DC

## 2023-02-10 MED ORDER — POTASSIUM CHLORIDE CRYS ER 20 MEQ PO TBCR
20.0000 meq | EXTENDED_RELEASE_TABLET | Freq: Once | ORAL | Status: AC
  Administered 2023-02-10: 20 meq via ORAL
  Filled 2023-02-10: qty 1

## 2023-02-10 MED ORDER — METOPROLOL SUCCINATE ER 25 MG PO TB24: 25.0000 mg | ORAL_TABLET | Freq: Every day | ORAL | Status: DC

## 2023-02-10 MED ORDER — WARFARIN SODIUM 7.5 MG PO TABS
7.5000 mg | ORAL_TABLET | Freq: Once | ORAL | Status: AC
  Administered 2023-02-10: 7.5 mg via ORAL
  Filled 2023-02-10: qty 1

## 2023-02-10 NOTE — Plan of Care (Signed)
  Problem: Education: Goal: Understanding of CV disease, CV risk reduction, and recovery process will improve Outcome: Progressing Goal: Individualized Educational Video(s) Outcome: Progressing   Problem: Activity: Goal: Ability to return to baseline activity level will improve Outcome: Progressing   Problem: Cardiovascular: Goal: Ability to achieve and maintain adequate cardiovascular perfusion will improve Outcome: Progressing Goal: Vascular access site(s) Level 0-1 will be maintained Outcome: Progressing   Problem: Health Behavior/Discharge Planning: Goal: Ability to safely manage health-related needs after discharge will improve Outcome: Progressing   Problem: Education: Goal: Patient will be able to verbalize current INR target range and antiplatelet therapy for discharge home Outcome: Progressing   Problem: Cardiac: Goal: LVAD will function as expected and patient will experience no clinical alarms Outcome: Progressing   Problem: Education: Goal: Knowledge of General Education information will improve Description: Including pain rating scale, medication(s)/side effects and non-pharmacologic comfort measures Outcome: Progressing   Problem: Health Behavior/Discharge Planning: Goal: Ability to manage health-related needs will improve Outcome: Progressing   Problem: Clinical Measurements: Goal: Ability to maintain clinical measurements within normal limits will improve Outcome: Progressing Goal: Will remain free from infection Outcome: Progressing Goal: Diagnostic test results will improve Outcome: Progressing Goal: Respiratory complications will improve Outcome: Progressing Goal: Cardiovascular complication will be avoided Outcome: Progressing   Problem: Activity: Goal: Risk for activity intolerance will decrease Outcome: Progressing   Problem: Nutrition: Goal: Adequate nutrition will be maintained Outcome: Progressing   Problem: Coping: Goal: Level of  anxiety will decrease Outcome: Progressing   Problem: Elimination: Goal: Will not experience complications related to bowel motility Outcome: Progressing Goal: Will not experience complications related to urinary retention Outcome: Progressing   Problem: Pain Managment: Goal: General experience of comfort will improve Outcome: Progressing   Problem: Safety: Goal: Ability to remain free from injury will improve Outcome: Progressing   Problem: Skin Integrity: Goal: Risk for impaired skin integrity will decrease Outcome: Progressing

## 2023-02-10 NOTE — Progress Notes (Signed)
Patient provided with verbal discharge instructions. Paper copy of discharge provided to patient. RN answered all questions. VSS at discharge. IV's removed. Patient belongings sent with patient. LVAD black backup bag sent with patient. Patient dc'd via wheelchair to private vehicle.

## 2023-02-10 NOTE — Progress Notes (Signed)
Echocardiogram 2D Echocardiogram has been performed.  Warren Lacy Nieves Barberi RDCS 02/10/2023, 3:38 PM

## 2023-02-10 NOTE — TOC Progression Note (Signed)
Transition of Care [facility]) - Progression Note    Patient Details  Name: Brandon [name] MRN: [id number] Date of Birth: 1956/03/04  Transition of Care [facility] CM/SW Contact  Nicanor Bake Phone Number: [phone] 02/10/2023, 1:26 PM  Clinical Narrative:   HF CSW meet with the pt at bedside. Pt stated that he was feeling great and looking forward to dc today after his echo. Pt stated at this time the does not need anything. CSW stated that she  would follow back up with pt if anyone from the team recommends to do so.   TOC will continue following.     Expected Discharge Plan: Home/Self Care Barriers to Discharge: Continued Medical Work up  Expected Discharge Plan and Services   Discharge Planning Services: CM Consult   Living arrangements for the past 2 months: Single Family Home                                       Social Determinants of Health (SDOH) Interventions SDOH Screenings   Food Insecurity: No Food Insecurity (02/05/2023)  Housing: Low Risk  (02/05/2023)  Transportation Needs: No Transportation Needs (02/05/2023)  Utilities: Not At Risk (02/05/2023)  Alcohol Screen: Low Risk  (07/26/2022)  Depression (PHQ2-9): Low Risk  (09/18/2022)  Financial Resource Strain: Low Risk  (07/26/2022)  Physical Activity: Sufficiently Active (09/10/2021)  Recent Concern: Physical Activity - Insufficiently Active (07/19/2021)  Social Connections: Socially Isolated (07/26/2022)  Stress: No Stress Concern Present (07/26/2022)  Tobacco Use: Medium Risk (02/05/2023)    Readmission Risk Interventions     No data to display

## 2023-02-10 NOTE — Plan of Care (Signed)
  Problem: Education: Goal: Understanding of CV disease, CV risk reduction, and recovery process will improve Outcome: Progressing   Problem: Activity: Goal: Ability to return to baseline activity level will improve Outcome: Progressing   Problem: Cardiovascular: Goal: Ability to achieve and maintain adequate cardiovascular perfusion will improve Outcome: Progressing   Problem: Education: Goal: Patient will be able to verbalize current INR target range and antiplatelet therapy for discharge home Outcome: Progressing   Problem: Cardiac: Goal: LVAD will function as expected and patient will experience no clinical alarms Outcome: Progressing   Problem: Education: Goal: Knowledge of General Education information will improve Description: Including pain rating scale, medication(s)/side effects and non-pharmacologic comfort measures Outcome: Progressing   Problem: Health Behavior/Discharge Planning: Goal: Ability to manage health-related needs will improve Outcome: Progressing   Problem: Clinical Measurements: Goal: Ability to maintain clinical measurements within normal limits will improve Outcome: Progressing Goal: Will remain free from infection Outcome: Progressing Goal: Diagnostic test results will improve Outcome: Progressing Goal: Respiratory complications will improve Outcome: Progressing   Problem: Nutrition: Goal: Adequate nutrition will be maintained Outcome: Progressing   Problem: Coping: Goal: Level of anxiety will decrease Outcome: Progressing   Problem: Pain Managment: Goal: General experience of comfort will improve Outcome: Progressing   Problem: Safety: Goal: Ability to remain free from injury will improve Outcome: Progressing   Problem: Skin Integrity: Goal: Risk for impaired skin integrity will decrease Outcome: Progressing

## 2023-02-10 NOTE — Progress Notes (Signed)
LVAD Coordinator Rounding Note:  Admitted 02/05/23 for symptomatic anemia and GI bleeding. Pt called VAD coordinator and reported dark stool x 7 days. Hgb 12.4 on 8/19 and dropped to 10.7 on 9/24. Hgb on admission 10.0.    HM3 LVAD implanted on 04/06/21 by Dr. Laneta Simmers under DT criteria.  9/26: EGD- AVM x 1 treated with APC  Pt sitting up in the recliner. Denies complaints. Reports 1 dark BM noted. Probable discharge later today. Will arrange follow up in VAD Clinic.  INR 1.9 today. Heparin drip stopped this morning.  Vital signs: Temp: 97.7 HR: 74 Doppler Pressure: 76 Automatic BP: 93/74 (82) O2 Sat: 96% on RA Wt: 199.7>200.4>195.1>201.5>202.2 lbs   LVAD interrogation reveals:  Speed: 5800 Flow: 4.6 Power: 4.7 w PI: 4.3  Alarms: none Events: 100+ (encouraged increased PO intake) Hematocrit: 32  Fixed speed: 5800 Low speed limit: 5500  Drive Line: Existing VAD dressing removed and site care performed using sterile technique. Drive line exit site cleaned with Chlora prep applicators x 2, allowed to dry, and Sorbaview dressing with Silverlon patch applied. Exit site healed and incorporated, the velour is fully implanted at exit site. No redness, tenderness, drainage, foul odor or rash noted. Drive line anchor re-applied on top of extra strip of Medipore tape. Next dressing due: 02/17/23 by nurse champion or caregiver.   Labs:  LDH trend: 137>129>125>136>137>125  INR trend: 1.8>1.4>1.5>1.5>1.9  Hgb trend: 10.0>10.7>10.8>10.7>10.4>9.9  Anticoagulation Plan: -INR Goal: 2.5 - 3.0 (increased 05/09/22) -ASA Dose: 81 mg daily--stopped this admission  Blood Products:   Device: N/A  Arrythmias: NSVT overnight; Toprol XL increased today and Losartan stopped by AHF rounding team and limited echo ordered prior to discharge  Plan/Recommendations:  1. Page VAD Coordinator with any equipment of VAD concerns 2. Weekly drive line dressing change per beside RN  Simmie Davies  RN,BSN VAD Coordinator  Office: [phone]  24/7 Pager: [phone]

## 2023-02-10 NOTE — Progress Notes (Addendum)
ANTICOAGULATION CONSULT NOTE  Pharmacy Consult for warfarin Indication:  HM3 LVAD  No Known Allergies  Patient Measurements: Height: 6' (182.9 cm) Weight: 91.7 kg (202 lb 2.6 oz) IBW/kg (Calculated) : 77.6 Heparin Dosing Weight: 92 kg   Vital Signs: Temp: 97.7 F (36.5 C) (09/30 0751) Temp Source: Oral (09/30 0751) BP: 95/76 (09/30 0754) Pulse Rate: 90 (09/30 0509)  Labs: Recent Labs    02/08/23 0219 02/09/23 0239 02/10/23 0313  HGB 10.7* 10.4* 9.9*  HCT 34.7* 33.7* 31.3*  PLT 176 175 153  LABPROT 18.1* 18.7* 21.7*  INR 1.5* 1.5* 1.9*  HEPARINUNFRC <0.10* <0.10* <0.10*  CREATININE 0.96 0.97 0.89    Estimated Creatinine Clearance: 86 mL/min (by C-G formula based on SCr of 0.89 mg/dL).   Medical History: Past Medical History:  Diagnosis Date   AICD (automatic cardioverter/defibrillator) present 08/31/2019   Ankylosing spondylitis (HCC)    Arthritis    BENIGN PROSTATIC HYPERTROPHY 06/07/2008   CHF (congestive heart failure) (HCC)    COLONIC POLYPS, HX OF 06/07/2008   Coronary artery disease    Depression    H/O hiatal hernia    Heart failure (HCC)    History of radiation therapy    Left Lung- 08/06/22-08/16/22- Dr. Antony Blackbird   HYPERLIPIDEMIA 06/07/2008   HYPERTENSION 06/07/2008   Myocardial infarction (HCC) 2005   NSTEMI, s/p LAD stent   NEPHROLITHIASIS, HX OF 06/07/2008   STEMI (ST elevation myocardial infarction) (HCC) 04/27/2019    Medications:  Scheduled:   atorvastatin  80 mg Oral Daily   ezetimibe  10 mg Oral Daily   gabapentin  300 mg Oral TID   losartan  12.5 mg Oral Daily   metoprolol succinate  12.5 mg Oral Daily   pantoprazole  40 mg Oral BID   sertraline  Brandon mg Oral Daily   sodium chloride flush  3 mL Intravenous Q12H   tamsulosin  0.4 mg Oral Daily   Warfarin - Pharmacist Dosing Inpatient   Does not apply q1600    Assessment: Brandon [name] with known history of HM3 LVAD 03/2021 - on warfarin PTA.  INR increased from 1.5 to 1.9 after  higher doses of warfarin. CBC stable - Hgb 9.9, plt 153. LDH 125. No s/sx of bleeding. Underwent endoscopy 9/26 finding gastritis and single AVM treated with APC. Will stop heparin infusion given INR 1.9 - level had been undetectable as expected on 500 units/hr.   PTA regimen is 7.5 mg daily except 5 mg every Monday.  Goal of Therapy:  INR 2.5-3  due to clotting on AoV and possible coronary embolism 12/23  Monitor platelets by anticoagulation protocol: Yes   Plan:  Stop heparin infusion  Warfarin 7.5 mg x1 Discussed with MD - no aspirin at discharge given melena this admission Monitor daily INR, CBC, and for s/sx of bleeding  If discharge would resume prior to admission regimen with INR check this Thursday (has home machine and usually checks on Thursday)  Thank you for allowing pharmacy to participate in this patient's care,  Sherron Monday, PharmD, BCCCP Clinical Pharmacist  Phone: [phone] 02/10/2023 8:Brandon AM  Please check AMION for all [facility] Pharmacy phone numbers After 10:00 PM, call Main Pharmacy [phone]

## 2023-02-10 NOTE — Progress Notes (Addendum)
Advanced Heart Failure VAD Team Note  PCP-Cardiologist: Olga Millers, MD   Subjective:    9/25: admitted w/ symptomatic anemia and GIB. Hgb 10 9/26: s/p APC treatment of AVM.   INR 1.9 Hgb 10.7>>9.9   No BM yesterday. Last BM was 3 days ago. Breathing improved. No dyspnea. Still slightly lightheaded if stands too quickly but reports is usual baseline. MAPs upper 60s-70s.   Had several runs of NSVT over the weekend, longest 12 beats but asymptomatic. K 3.9. Mg pending.   Otherwise, feels well. OOB, up in chair eating breakfast. Appetite good.    LVAD INTERROGATION:  HeartMate III LVAD:   Flow 4.6 liters/min, speed 5800, power 4.9, PI 4.9  on battery   Objective:    Vital Signs:   Temp:  [97.3 F (36.3 C)-97.7 F (36.5 C)] 97.7 F (36.5 C) (09/30 0509) Pulse Rate:  [70-94] 90 (09/30 0509) Resp:  [14-20] 17 (09/30 0509) BP: (78-110)/(57-96) 92/57 (09/30 0509) SpO2:  [97 %-99 %] 99 % (09/30 0509) Weight:  [91.7 kg] 91.7 kg (09/30 0509) Last BM Date : 02/07/23 Mean arterial Pressure upper 60s-70s   Intake/Output:   Intake/Output Summary (Last 24 hours) at 02/10/2023 9629 Last data filed at 02/10/2023 0400 Gross per 24 hour  Intake 1562.06 ml  Output 3320 ml  Net -1757.94 ml     Physical Exam    GENERAL: well appearing, sitting up in chair. No distress  HEENT: normal  NECK: Supple, JVP not elevated  2+ bilaterally, no bruits.  No lymphadenopathy or thyromegaly appreciated.   CARDIAC:  Mechanical heart sounds with LVAD hum present.  LUNGS:  CTAB. No wheezing  ABDOMEN:  Soft, round, nontender, positive bowel sounds x4.     LVAD exit site:   Dressing dry and intact.  No erythema or drainage.  Stabilization device present and accurately applied.  Driveline dressing is being changed daily per sterile technique. EXTREMITIES:  Warm and dry, no cyanosis, clubbing, rash or edema  NEUROLOGIC:  Alert and oriented x 3.    No aphasia.  No dysarthria.  Affect pleasant.       Telemetry   Artifact Barostim/HMIII VAD. Looks like SR. Multiple runs of NSVT over the weekend, longest 12 beats. Personally reviewed   EKG    N/A  Labs   Basic Metabolic Panel: Recent Labs  Lab 02/06/23 0233 02/06/23 0234 02/07/23 0331 02/08/23 0219 02/09/23 0239 02/10/23 0313  NA  --  138 138 137 139 138  K  --  4.0 3.9 4.0 4.0 3.9  CL  --  104 105 103 104 106  CO2  --  27 25 24 25 26   GLUCOSE  --  95 103* 110* 113* 107*  BUN  --  12 11 14 16 11   CREATININE  --  0.91 0.82 0.96 0.97 0.89  CALCIUM  --  8.7* 8.8* 8.6* 8.9 8.5*  MG 2.0  --   --   --   --   --     CBC: Recent Labs  Lab 02/06/23 0234 02/07/23 0331 02/07/23 1153 02/08/23 0219 02/09/23 0239 02/10/23 0313  WBC 6.5 7.2  --  7.5 6.4 7.1  HGB 10.7* 10.8* 11.6* 10.7* 10.4* 9.9*  HCT 34.4* 35.1*  --  34.7* 33.7* 31.3*  MCV 81.3 81.3  --  83.4 83.8 83.5  PLT 178 181  --  176 175 153    INR: Recent Labs  Lab 02/06/23 0234 02/07/23 0331 02/08/23 0219 02/09/23 0239 02/10/23 5284  INR 1.8* 1.4* 1.5* 1.5* 1.9*    Other results:   Imaging   No results found.   Medications:     Scheduled Medications:  atorvastatin  80 mg Oral Daily   ezetimibe  10 mg Oral Daily   gabapentin  300 mg Oral TID   losartan  12.5 mg Oral Daily   metoprolol succinate  12.5 mg Oral Daily   pantoprazole  40 mg Oral BID   sertraline  50 mg Oral Daily   sodium chloride flush  3 mL Intravenous Q12H   tamsulosin  0.4 mg Oral Daily   Warfarin - Pharmacist Dosing Inpatient   Does not apply q1600    Infusions:  sodium chloride     heparin 500 Units/hr (02/09/23 1901)    PRN Medications: sodium chloride, acetaminophen, melatonin, ondansetron (ZOFRAN) IV, polyethylene glycol, senna-docusate   Patient Profile   Brandon [name] is a 67 year old with a history of  tobacco use, CAD s/p previous anterior MI, severe systolic HF s/p HM-3 VAD 04/06/21, barostim, severe AS s/p AVR, endocarditis.    Admitted with  symptomatic anemia.  Assessment/Plan:    1. Symptomatic Anemia  - Prior to admit melena x 7 days and developed concerning symptoms. Lightheaded.  - 9/26 s/p APC treatment of AVM.  - no BM x 3 days. Hgb 10.7>>9.9  - Continue Protonix 40 bid  - with INR therapeutic, will stop heparin gtt - w/ dropping hgb will monitor 1 more day    2. Chronic HFrEF, ICU  - Echo 05/19/20 EF 20-25% RV mildly HK. moderate AS  Mean gradient 13 AVA 1.2 cm2 DI 0.30. - Echo 12/30/22: LVEF < 20% LVIDd 6.1cm. Cannula well placed. RV mildly HK. AoV not opening. No AI.  - s/p HM-III VAD + bioprosthetic AVR 04/06/21 - Appears euvolemic  - Continue current  Toprol XL will increase to 25 mg daily to help w/ NSVT - Stop losartan to allow Toprol XL increase    3. HMIII LVAD, DT  - Hold ASA and coumadin.  - Goal INR 1.8  - INR 1.9 today; can stop heparin gtt today  - Warfarin dosing per pharmD.   4. VT/VF -Most recent VT/VF 04/2022 -NSVT this admit -K ok. Check Mg level  -Increase Toprol XL  -Check limited echo    5. CAD  - s/p anterior STEMI (12/20). LHC showed chronically occluded RCA (with L>>R collaterals) and thrombotic occlusion of proximal LAD. Underwent PCI of LAD. - Recurrent anterior STEMI in 12/23 Cath 12/23 LAD stent patent. Felt to be embolic from aortic valve - Continue atorvastatin 80 mg daily    6. PAF - He has been off amio. Maintaining SR.    7. Lung Cancer Completed radiation  I reviewed the LVAD parameters from today, and compared the results to the patient's prior recorded data.  No programming changes were made.  The LVAD is functioning within specified parameters.  The patient performs LVAD self-test daily.  LVAD interrogation was negative for any significant power changes, alarms or PI events/speed drops.  LVAD equipment check completed and is in good working order.  Back-up equipment present.   LVAD education done on emergency procedures and precautions and reviewed exit site  care.  Length of Stay: [address], [place] 02/10/2023, 7:12 AM  VAD Team --- VAD ISSUES ONLY--- Pager [phone] (7am - 7am)  Advanced Heart Failure Team  Pager [phone] (M-F; 7a - 5p)  Please contact [facility] for night-coverage after hours (  5p -7a ) and weekends on [email or web address]

## 2023-02-13 ENCOUNTER — Other Ambulatory Visit (HOSPITAL_COMMUNITY): Payer: Medicare Other

## 2023-02-13 ENCOUNTER — Ambulatory Visit (HOSPITAL_COMMUNITY): Payer: Self-pay | Admitting: Pharmacist

## 2023-02-13 LAB — POCT INR: INR: 2.4 (ref 2.0–3.0)

## 2023-02-14 ENCOUNTER — Other Ambulatory Visit (HOSPITAL_COMMUNITY): Payer: Self-pay | Admitting: Unknown Physician Specialty

## 2023-02-14 DIAGNOSIS — Z7901 Long term (current) use of anticoagulants: Secondary | ICD-10-CM

## 2023-02-14 DIAGNOSIS — Z95811 Presence of heart assist device: Secondary | ICD-10-CM

## 2023-02-18 ENCOUNTER — Ambulatory Visit (HOSPITAL_COMMUNITY)
Admit: 2023-02-18 | Discharge: 2023-02-18 | Disposition: A | Discharge status: Home / Self Care | Payer: Medicare Other | Source: Ambulatory Visit | Attending: Internal Medicine | Admitting: Internal Medicine

## 2023-02-18 DIAGNOSIS — Z7901 Long term (current) use of anticoagulants: Secondary | ICD-10-CM | POA: Insufficient documentation

## 2023-02-18 DIAGNOSIS — Z95811 Presence of heart assist device: Secondary | ICD-10-CM | POA: Diagnosis present

## 2023-02-18 LAB — CBC
HCT: 32 % — ABNORMAL LOW (ref 39.0–52.0)
Hemoglobin: 9.9 g/dL — ABNORMAL LOW (ref 13.0–17.0)
MCH: 25.1 pg — ABNORMAL LOW (ref 26.0–34.0)
MCHC: 30.9 g/dL (ref 30.0–36.0)
MCV: 81 fL (ref 80.0–100.0)
Platelets: 183 10*3/uL (ref 150–400)
RBC: 3.95 MIL/uL — ABNORMAL LOW (ref 4.22–5.81)
RDW: 17.6 % — ABNORMAL HIGH (ref 11.5–15.5)
WBC: 6.9 10*3/uL (ref 4.0–10.5)
nRBC: 0 % (ref 0.0–0.2)

## 2023-02-20 ENCOUNTER — Ambulatory Visit (HOSPITAL_COMMUNITY): Payer: Self-pay | Admitting: Pharmacist

## 2023-02-20 LAB — POCT INR: INR: 2.4 (ref 2.0–3.0)

## 2023-02-24 ENCOUNTER — Telehealth (HOSPITAL_COMMUNITY): Payer: Self-pay

## 2023-02-24 MED ORDER — EPLERENONE 25 MG PO TABS: 50.0000 mg | ORAL_TABLET | Freq: Every morning | ORAL | 5 refills | Status: DC

## 2023-02-24 NOTE — Telephone Encounter (Signed)
Pt called this morning stating that Dr. Ashley Royalty would like to increase his eplerenone to 50 mg from 25 mg due to increased fluid in the pts retina. Discussed with Dr. Gala Romney who approved increase in dose. Pt called and informed of approval in increase dose. Medication list updated.  Brandon Davies RN, BSN VAD Coordinator 24/7 Pager [phone]

## 2023-02-25 ENCOUNTER — Other Ambulatory Visit: Payer: Self-pay

## 2023-02-27 ENCOUNTER — Ambulatory Visit (HOSPITAL_COMMUNITY): Payer: Self-pay | Admitting: Pharmacist

## 2023-02-27 LAB — POCT INR: INR: 2.9 (ref 2.0–3.0)

## 2023-03-06 ENCOUNTER — Ambulatory Visit (HOSPITAL_COMMUNITY): Payer: Self-pay | Admitting: Pharmacist

## 2023-03-06 LAB — POCT INR: INR: 2.9 (ref 2.0–3.0)

## 2023-03-10 ENCOUNTER — Other Ambulatory Visit (HOSPITAL_COMMUNITY): Payer: Self-pay

## 2023-03-10 DIAGNOSIS — Z7901 Long term (current) use of anticoagulants: Secondary | ICD-10-CM

## 2023-03-10 DIAGNOSIS — Z95811 Presence of heart assist device: Secondary | ICD-10-CM

## 2023-03-11 ENCOUNTER — Encounter (HOSPITAL_COMMUNITY): Payer: Self-pay | Admitting: Internal Medicine

## 2023-03-11 ENCOUNTER — Other Ambulatory Visit (HOSPITAL_COMMUNITY): Payer: Self-pay | Admitting: Unknown Physician Specialty

## 2023-03-11 ENCOUNTER — Ambulatory Visit (HOSPITAL_COMMUNITY): Payer: Self-pay | Admitting: Pharmacist

## 2023-03-11 ENCOUNTER — Ambulatory Visit (HOSPITAL_COMMUNITY)
Admission: RE | Admit: 2023-03-11 | Discharge: 2023-03-11 | Disposition: A | Discharge status: Home / Self Care | Payer: Medicare Other | Source: Ambulatory Visit | Attending: Internal Medicine | Admitting: Internal Medicine

## 2023-03-11 ENCOUNTER — Other Ambulatory Visit (HOSPITAL_COMMUNITY): Payer: Self-pay | Admitting: *Deleted

## 2023-03-11 VITALS — BP 120/0 | HR 75 | Wt 209.2 lb

## 2023-03-11 DIAGNOSIS — Z95811 Presence of heart assist device: Secondary | ICD-10-CM

## 2023-03-11 DIAGNOSIS — I2510 Atherosclerotic heart disease of native coronary artery without angina pectoris: Secondary | ICD-10-CM | POA: Diagnosis not present

## 2023-03-11 DIAGNOSIS — D509 Iron deficiency anemia, unspecified: Secondary | ICD-10-CM

## 2023-03-11 DIAGNOSIS — I472 Ventricular tachycardia, unspecified: Secondary | ICD-10-CM

## 2023-03-11 DIAGNOSIS — D500 Iron deficiency anemia secondary to blood loss (chronic): Secondary | ICD-10-CM

## 2023-03-11 DIAGNOSIS — Z7901 Long term (current) use of anticoagulants: Secondary | ICD-10-CM | POA: Diagnosis not present

## 2023-03-11 DIAGNOSIS — I5022 Chronic systolic (congestive) heart failure: Secondary | ICD-10-CM | POA: Diagnosis not present

## 2023-03-11 LAB — COMPREHENSIVE METABOLIC PANEL
ALT: 18 U/L (ref 0–44)
AST: 22 U/L (ref 15–41)
Albumin: 3.9 g/dL (ref 3.5–5.0)
Alkaline Phosphatase: 113 U/L (ref 38–126)
Anion gap: 9 (ref 5–15)
BUN: 14 mg/dL (ref 8–23)
CO2: 26 mmol/L (ref 22–32)
Calcium: 9.3 mg/dL (ref 8.9–10.3)
Chloride: 102 mmol/L (ref 98–111)
Creatinine, Ser: 0.97 mg/dL (ref 0.61–1.24)
GFR, Estimated: >60 mL/min (ref >60)
Glucose, Bld: 128 mg/dL — ABNORMAL HIGH (ref 70–99)
Potassium: 4.7 mmol/L (ref 3.5–5.1)
Sodium: 137 mmol/L (ref 135–145)
Total Bilirubin: 0.7 mg/dL (ref 0.3–1.2)
Total Protein: 7.5 g/dL (ref 6.5–8.1)

## 2023-03-11 LAB — CBC
HCT: 35.7 % — ABNORMAL LOW (ref 39.0–52.0)
Hemoglobin: 10.5 g/dL — ABNORMAL LOW (ref 13.0–17.0)
MCH: 23.5 pg — ABNORMAL LOW (ref 26.0–34.0)
MCHC: 29.4 g/dL — ABNORMAL LOW (ref 30.0–36.0)
MCV: 79.9 fL — ABNORMAL LOW (ref 80.0–100.0)
Platelets: 217 10*3/uL (ref 150–400)
RBC: 4.47 MIL/uL (ref 4.22–5.81)
RDW: 17.1 % — ABNORMAL HIGH (ref 11.5–15.5)
WBC: 7.2 10*3/uL (ref 4.0–10.5)
nRBC: 0 % (ref 0.0–0.2)

## 2023-03-11 LAB — VITAMIN B12: Vitamin B-12: 317 pg/mL (ref 180–914)

## 2023-03-11 LAB — PROTIME-INR
INR: 2.9 — ABNORMAL HIGH (ref 0.8–1.2)
Prothrombin Time: 30.9 s — ABNORMAL HIGH (ref 11.4–15.2)

## 2023-03-11 LAB — MAGNESIUM: Magnesium: 1.8 mg/dL (ref 1.7–2.4)

## 2023-03-11 LAB — LACTATE DEHYDROGENASE: LDH: 164 U/L (ref 98–192)

## 2023-03-11 LAB — PREALBUMIN: Prealbumin: 20 mg/dL (ref 18–38)

## 2023-03-11 LAB — FERRITIN: Ferritin: 16 ng/mL — ABNORMAL LOW (ref 24–336)

## 2023-03-11 LAB — IRON AND TIBC
Iron: 19 ug/dL — ABNORMAL LOW (ref 45–182)
Saturation Ratios: 4 % — ABNORMAL LOW (ref 17.9–39.5)
TIBC: 445 ug/dL (ref 250–450)
UIBC: 426 ug/dL

## 2023-03-11 LAB — FOLATE: Folate: 9.1 ng/mL (ref >5.9)

## 2023-03-11 MED ORDER — FERUMOXYTOL INJECTION 510 MG/17 ML: 510.0000 mg | INTRAVENOUS | Status: DC

## 2023-03-11 NOTE — Progress Notes (Signed)
LVAD Follow-up Clinic Note   PCP: Deeann Saint, MD HF MD: DB   HPI:  Brandon [name] is a 67 year old male with history of tobacco use, CAD s/p previous anterior MI, severe systolic HF s/p HM-3 VAD 04/06/21, severe AS s/p AVR, endocarditis.    Admitted 12/20 with anterior ST elevation MI. Cath with chronically occluded RCA (L>>R collaterals) and thrombotic occlusion of proximal LAD treated with PCI. Echo w/ reduced LVEF 30-35% w/ apical aneurysm. RV ok. Post cath, he required milrinone for cardiogenic shock. Subsequently underwent Barostim.   Underwent outpatient [facility] 11/29/20 as part of VAD w/u. Post procedure, developed severe rigors and fever. Admitted to ICU Blood Cx + strep. 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  Likely low-flow low gradient AS. Underwent ICD extraction..    Admitted for cardiogenic shock in 10/22.  He underwent placement of HM3 VAD and aortic valve replacement with 25 mm Edwards Inspiris Resilia valve on 04/06/21. Surgical path came back 11/28 with evidence of possible "acute aortic valve endocarditis". 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 Discharged 05/09/21.   He had a complicated month of December.  On 04/22/2022 he presented with shortness of breath and diaphoresis.  Found to be in ventricular fibrillation.  Was defibrillated.  Then on Christmas day he presented back with chest pain, nausea, and shortness of breath.  ECG showed ST elevation.  Catheterization showed stable CAD. Cardiac CT suspicious for clot on AoV. INR increased to 2.5-3.0 range.   Had PET 1/24 which showed increased size of the spiculated LUL pulmonary nodule which is hypermetabolic concerning for primary bronchogenic carcinoma. Has seen Dr. Dorris Fetch and referred  for stereotactic radiation without biopsy.   Admitted on 02/05/23 with anemia hgb was 10.3. INR 2.0. He was given a bolus of IVFs. Protonix increased to 40 mg bid. GI consulted. On 9/26, underwent EGD showing 1 duodenal AVM which was treated APC. During admission, episode of hypotension requiring additional 500cc bolus IVF. Epleronone decreases to 25mg  daily at discharge. Intermittent runs of VT    Follow up for Heart Failure/LVAD: Here for routine f/u. Doing great. Continues to go to the gym on a regular basis without problem. Denies orthopnea or PND. No fevers, chills or problems with driveline. No bleeding, melena or neuro symptoms. No VAD alarms. Taking all meds as prescribed.    VAD Interrogation: Speed: 5800 Flow: 4.8 Power: 4.7w    PI: 5.1 Alarms: none Events: >100 PI events   Fixed speed: 5800 Low speed limit: 5500   Primary controller: back up battery due for replacement in 11 months Secondary controller:  back up battery due for replacement in 11 months    I reviewed the LVAD parameters from today and compared the results to the patient's prior recorded data. LVAD interrogation was NEGATIVE for significant power changes, NEGATIVE for clinical alarms and STABLE for PI events/speed drops. No programming changes were made and pump is functioning within specified parameters. Pt is performing daily controller and system monitor self tests along with completing weekly and monthly maintenance for LVAD equipment.   LVAD equipment check completed and is in good working order. Back-up equipment  present and charged at today's visit.   Annual Equipment Maintenance on UBC/PM was performed today 03/11/23.    Past Medical History:  Diagnosis Date  AICD (automatic cardioverter/defibrillator) present 08/31/2019   Ankylosing spondylitis (HCC)    Arthritis    BENIGN PROSTATIC HYPERTROPHY 06/07/2008   CHF (congestive heart failure) (HCC)    COLONIC POLYPS, HX OF 06/07/2008   Coronary artery  disease    Depression    H/O hiatal hernia    Heart failure (HCC)    History of radiation therapy    Left Lung- 08/06/22-08/16/22- Dr. Antony Blackbird   HYPERLIPIDEMIA 06/07/2008   HYPERTENSION 06/07/2008   Myocardial infarction [facility]) 2005   NSTEMI, s/p LAD stent   NEPHROLITHIASIS, HX OF 06/07/2008   STEMI (ST elevation myocardial infarction) (HCC) 04/27/2019    Current Outpatient Medications  Medication Sig Dispense Refill   acetaminophen (TYLENOL) 500 MG tablet Take 500-1,000 mg by mouth every 6 (six) hours as needed (for pain).     atorvastatin (LIPITOR) 80 MG tablet PATIENT MUST ATTEND ANNUAL APPOINTMENT FOR FUTURE REFILLS TAKE 1 TABLET BY MOUTH ONCE DAILY AT 6PM. (Patient taking differently: Take 80 mg by mouth every evening.) 90 tablet 1   Carboxymethylcellulose Sodium (ARTIFICIAL TEARS OP) Place 1 drop into both eyes daily as needed (for dryness or irritation).     eplerenone (INSPRA) 25 MG tablet Take 2 tablets (50 mg total) by mouth in the morning. 30 tablet 5   ezetimibe (ZETIA) 10 MG tablet TAKE ONE TABLET BY MOUTH DAILY 90 tablet 3   gabapentin (NEURONTIN) 300 MG capsule Take 1 capsule (300 mg total) by mouth 3 (three) times daily. 90 capsule 3   metoprolol succinate (TOPROL-XL) 25 MG 24 hr tablet Take 1 tablet (25 mg total) by mouth daily. 30 tablet 3   nystatin (MYCOSTATIN/NYSTOP) powder Apply 1 Application topically 3 (three) times daily. (Patient taking differently: Apply 1 Application topically 3 (three) times daily as needed (For dry skin).) 15 g 2   pantoprazole (PROTONIX) 40 MG tablet Take 1 tablet (40 mg total) by mouth 2 (two) times daily. 60 tablet 6   potassium chloride SA (KLOR-CON M20) 20 MEQ tablet Take 1 tablet as needed when taking Torsemide     sertraline (ZOLOFT) 50 MG tablet TAKE 1/2 TAB BY MOUTH DAILY FOR 1 WEEK, THEN INCREASE TO 1 TABLET DAILY. (Patient taking differently: Take 50 mg by mouth daily. Pt taking 50 mg daily) 90 tablet 3   tamsulosin (FLOMAX) 0.4  MG CAPS capsule Take 2 capsules (0.8 mg total) by mouth daily. (Patient taking differently: Take 0.4 mg by mouth daily.) 180 capsule 1   torsemide (DEMADEX) 20 MG tablet Take 1 tablet (20 mg total) by mouth as needed. (Patient not taking: Reported on 12/23/2022) 30 tablet 5   traMADol (ULTRAM) 50 MG tablet TAKE 1-2 TABLETS BY MOUTH TWICE A DAY FOR 30 DAYS (Patient taking differently: Take 50 mg by mouth every 6 (six) hours as needed for moderate pain.) 120 tablet 1   warfarin (COUMADIN) 2.5 MG tablet TAKE 7.5 MG (3 TABLETS) EVERY MONDAY/FRIDAY AND 5 MG (2 TABLETS) ALL OTHER DAYS OR AS INSTRUCTED BY LVAD CLINIC. (Patient taking differently: Take two of the 2.5 mg tablets for total of 5mg  on Mondays and all other days take 3 of the 2.5 mg tablets for total of 7.5mg  per patient) 180 tablet 11   ZYRTEC ALLERGY 10 MG tablet Take 10 mg by mouth daily.     No current facility-administered medications for this encounter.    Patient has no known allergies.   Vital Signs:     Doppler Pressure: 120 Automatic BP: 102/78 (87)  HR: 75 NSR     SPO2: UTO % on RA   Weight: 209.2 w/ ept Last weight: 208.5 lb w/ eqt Ht: 6'    Physical Exam: General:  NAD.  HEENT: normal  Neck: supple. JVP not elevated.  Carotids 2+ bilat; no bruits. No lymphadenopathy or thryomegaly appreciated. Cor: LVAD hum.  Lungs: Clear. Abdomen: soft, nontender, non-distended. No hepatosplenomegaly. No bruits or masses. Good bowel sounds. Driveline site clean. Anchor in place.  Extremities: no cyanosis, clubbing, rash. Warm no edema  Neuro: alert & oriented x 3. No focal deficits. Moves all 4 without problem    ASSESSMENT AND PLAN:  1. Chronic systolic HFr EF due to iCM - Echo 05/19/20 EF 20-25% RV mildly HK. moderate AS  Mean gradient 13 AVA 1.2 cm2 DI 0.30 - s/p HM-III VAD + bioprosthetic AVR 04/06/21 - Episode of cardiogenic shock and anterior ST elevation 12/23. Cath with patent LAD stent. Cardiac CT suggestive of clot on  aortic valve. Felt to be possible embolic event  - Doing great NYHA I - Volume status ok. Continues to have frequent PI event. Reminded to keep fluid intake up.  - Continue Toprol 12.5 daily - Continue eplerenone 50 mg daily   2. HM-3 LVAD implant - VAD interrogated personally. Parameters stable. As above, continues with frequent PI events.Encouraged fluid intake.  - Ramp echo 12/30/22: LVEF < 20% LVIDd 6.1cm. Cannula well placed. RV mildly HK. AoV not opening. No AI. Septum midline at 5800. No change in speed - ASA resumed due to previous thrombotic episode - INR 2.9 Goal 2.5-3.0 due to clotting on AoV and possible coronary embolism 12/23 Discussed dosing with PharmD personally. - LDH 164 - Hgb 12.4 -> 10.5. Iron stores low. Will need IV iron. If continues to drop will need GI eval - MAPs ok - DL site ok,  3. VT/VF 12/23 - remains in NSR.  - No recent trecurrence  4. Acute upper GI bleed 9/24 - 02/06/23 underwent EGD showing 1 duodenal AVM which was treated APC - No evidence of ongoing bleeding but Hgb and iron stores down.  - Arrange for IV iron - If hgb continues to trend down will need repeat GI eval   5. Severe low-flow aortic stenosis s/p AVR - s/p VAD/bioprosthetic AVR - no change   6. CAD  - s/p anterior STEMI (12/20). LHC showed chronically occluded RCA (with L>>R collaterals) and thrombotic occlusion of proximal LAD. Underwent PCI of LAD. - Now s/p VAD - Recurrent anterior STEMI in 12/23 Cath 12/23 LAD stent patent. Felt to be embolic from aortic valve - Continue atorvastatin 80.  - Continue ASA 81 with previous thrombotic episode off AoV - No s/s angina   7. Paroxysmal Atrial Fibrillation w/ RVR - Remains in NSR - Off amio - on warfarin.    8. 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  - Tissue sent to [facility] in [place] for broad 16 S ribosomal sequencing  for bacterial pathogens as well as T wet Bhilai Bartonella Brucella  - Resolved  9. Ankylosing spondylitis - His local rheumatologist is hesitant to start remicade with VAD/infection risk - D/w [name] VAD program they have recommended f/u with Council Mechanic, MD for 2nd opinion if needed.  - Currently feeling ok without it - PCP managing pain meds  10. LUL Bronchogenic CA - h/o tobacco use - Has seen Dr. Dorris Fetch.  -  s/p XRT 5/24 - continue to follow with oncology  11. Ascending aortic aneurysm - 4.1 cm on recent CT. Will follow - provided reassurance that this is just minimally dilated.   I spent a total of 45 minutes today: 1) reviewing the patient's medical records including previous charts, labs and recent notes from other providers; 2) examining the patient and counseling them on their medical issues/explaining the plan of care; 3) adjusting meds as needed and 4) ordering lab work or other needed tests.    Arvilla Meres, MD  9:19 AM

## 2023-03-11 NOTE — Patient Instructions (Signed)
No medication changes today Will call regarding Iron panel results  Coumadin dosing per [facility]

## 2023-03-11 NOTE — Progress Notes (Addendum)
Patient presents to VAD Clinic for 2 month f/u with 2 year Intermacs and Annual Maintenance. Reports no problems with VAD equipment or concerns with drive line.  Patient says he is feeling good. He has started to notice increased shortness when walking long distances where he feels the need to take a rest break. Denies lightheadedness, dizziness, falls and signs of bleeding. Pt continues to work out 6 days a week at [employer]. Discussed with Dr.Bensimon. Anemia panel added onto labs today.   Pt recently hospitalized for GI bleed 9/25. Pt underwent EGD 9/26 which showed 1 duodenal AVM that was treated with APC. Pt reports no recurrent dark stools since discharge. During admission pt had intermittent runs of VT. Metoprolol increased to 25mg  daily and losartan stopped at discharge. Pt reports compliance with all medications.   Vital Signs:    Doppler Pressure: 120 Automatic BP: 102/78 (87) HR: 75 NSR  SPO2: UTO % on RA   Weight: 209.2 w/ ept Last weight: 208.5 lb w/ eqt Ht: 6'   VAD Interrogation: Speed: 5800 Flow: 4.8 Power: 4.7w    PI: 5.1 Alarms: none Events: >100 PI events   Fixed speed: 5800 Low speed limit: 5500  Primary controller: back up battery due for replacement in 11 months Secondary controller:  back up battery due for replacement in 11 months    I reviewed the LVAD parameters from today and compared the results to the patient's prior recorded data. LVAD interrogation was NEGATIVE for significant power changes, NEGATIVE for clinical alarms and STABLE for PI events/speed drops. No programming changes were made and pump is functioning within specified parameters. Pt is performing daily controller and system monitor self tests along with completing weekly and monthly maintenance for LVAD equipment.   LVAD equipment check completed and is in good working order. Back-up equipment  present and charged at today's visit.   Annual Equipment Maintenance on UBC/PM was performed today  03/11/23.   Batteries Manufacture Date: Number of uses: Re-calibration  03/20/21 184   03/20/21 131   03/20/21 130   03/20/21 169   03/20/21 129    Annual maintenance completed per [employer] on patient's home power module and Warehouse manager.    Backup system controller 11 volt battery charged during visit.   2 year Intermacs follow up completed including:  Quality of Life, KCCQ-12, and Neurocognitive trail making.   Pt completed 1000 feet during 6 minute walk.  Patient Goals: To keep thriving!   [place] Cardiomyopathy Questionnaire     03/11/2023    1:40 PM 10/10/2022    3:48 PM 06/03/2022   12:32 PM  KCCQ-12  1 a. Ability to shower/bathe Not at all limited Not at all limited Not at all limited  1 b. Ability to walk 1 block Slightly limited Slightly limited Moderately limited  1 c. Ability to hurry/jog Quite a bit limited Quite a bit limited Quite a bit limited  2. Edema feet/ankles/legs Never over the past 2 weeks Never over the past 2 weeks Never over the past 2 weeks  3. Limited by fatigue Less than once a week 1-2 times a week 3+ times per week, not every day  4. Limited by dyspnea Less than once a week 1-2 times a week 3+ times a week, not every day  5. Sitting up / on 3+ pillows Never over the past 2 weeks Never over the past 2 weeks Never over the past 2 weeks  6. Limited enjoyment of life Not limited  at all Not limited at all Moderately limited  7. Rest of life w/ symptoms Mostly satisfied Mostly satisfied Mostly satisfied  8 a. Participation in hobbies Did not limit at all Slightly limited Slightly limited  8 b. Participation in chores Did not limit at all Did not limit at all Slightly limited  8 c. Visiting family/friends Did not limit at all Did not limit at all Did not limit at all      Exit Site Care:  Existing VAD dressing CDI. Anchor secure Provided patient with 8 weekly dressing kits for home use.  Device: N/A  (removed due to infection 12/04/20)  BP &  Labs:  Doppler 120 - Doppler is reflecting modified systolic   Hgb 10.5 - No S/S of bleeding. Specifically denies melena/BRBPR or nosebleeds.   LDH stable at 164 with established baseline of 135 - 309. Denies tea-colored urine. No power elevations noted on interrogation.   Patient Instructions:  No medication changes today Will call regarding Iron panel results  Coumadin dosing per [facility]  Addendum: Anemia panel reviewed with Lauren RPH. Orders placed for 2 doses of IV Feraheme at [facility]. Pt called with appointment information and verbalized instructions.  Simmie Davies RN,BSN VAD Coordinator  Office: [phone]  24/7 Pager: [phone]

## 2023-03-12 ENCOUNTER — Encounter (INDEPENDENT_AMBULATORY_CARE_PROVIDER_SITE_OTHER): Payer: Medicare Other | Admitting: Ophthalmology

## 2023-03-12 DIAGNOSIS — H35033 Hypertensive retinopathy, bilateral: Secondary | ICD-10-CM

## 2023-03-12 DIAGNOSIS — H43813 Vitreous degeneration, bilateral: Secondary | ICD-10-CM

## 2023-03-12 DIAGNOSIS — H35712 Central serous chorioretinopathy, left eye: Secondary | ICD-10-CM | POA: Diagnosis not present

## 2023-03-12 DIAGNOSIS — I10 Essential (primary) hypertension: Secondary | ICD-10-CM

## 2023-03-12 DIAGNOSIS — H353132 Nonexudative age-related macular degeneration, bilateral, intermediate dry stage: Secondary | ICD-10-CM

## 2023-03-13 ENCOUNTER — Other Ambulatory Visit: Payer: Self-pay | Admitting: Family Medicine

## 2023-03-13 DIAGNOSIS — N400 Enlarged prostate without lower urinary tract symptoms: Secondary | ICD-10-CM

## 2023-03-14 ENCOUNTER — Ambulatory Visit (HOSPITAL_COMMUNITY)
Admission: RE | Admit: 2023-03-14 | Discharge: 2023-03-14 | Disposition: A | Discharge status: Home / Self Care | Payer: Medicare Other | Source: Ambulatory Visit | Attending: Internal Medicine

## 2023-03-14 DIAGNOSIS — D509 Iron deficiency anemia, unspecified: Secondary | ICD-10-CM | POA: Diagnosis present

## 2023-03-14 DIAGNOSIS — Z95811 Presence of heart assist device: Secondary | ICD-10-CM | POA: Insufficient documentation

## 2023-03-14 MED ORDER — SODIUM CHLORIDE 0.9 % IV SOLN
510.0000 mg | INTRAVENOUS | Status: DC
  Administered 2023-03-14: 510 mg via INTRAVENOUS
  Filled 2023-03-14: qty 510

## 2023-03-20 ENCOUNTER — Ambulatory Visit (HOSPITAL_COMMUNITY): Payer: Self-pay | Admitting: Pharmacist

## 2023-03-20 LAB — POCT INR: INR: 2.6 (ref 2.0–3.0)

## 2023-03-21 ENCOUNTER — Ambulatory Visit (HOSPITAL_COMMUNITY)
Admission: RE | Admit: 2023-03-21 | Discharge: 2023-03-21 | Disposition: A | Discharge status: Home / Self Care | Payer: Medicare Other | Source: Ambulatory Visit | Attending: Internal Medicine | Admitting: Internal Medicine

## 2023-03-21 ENCOUNTER — Other Ambulatory Visit: Payer: Self-pay | Admitting: Medical Genetics

## 2023-03-21 DIAGNOSIS — Z95811 Presence of heart assist device: Secondary | ICD-10-CM | POA: Insufficient documentation

## 2023-03-21 DIAGNOSIS — D509 Iron deficiency anemia, unspecified: Secondary | ICD-10-CM | POA: Diagnosis present

## 2023-03-21 DIAGNOSIS — Z006 Encounter for examination for normal comparison and control in clinical research program: Secondary | ICD-10-CM

## 2023-03-21 MED ORDER — SODIUM CHLORIDE 0.9 % IV SOLN
510.0000 mg | INTRAVENOUS | Status: DC
  Administered 2023-03-21: 510 mg via INTRAVENOUS
  Filled 2023-03-21: qty 510

## 2023-03-26 ENCOUNTER — Other Ambulatory Visit (HOSPITAL_COMMUNITY): Payer: Self-pay | Admitting: Cardiology

## 2023-03-27 ENCOUNTER — Other Ambulatory Visit (HOSPITAL_COMMUNITY): Payer: Self-pay

## 2023-03-27 ENCOUNTER — Ambulatory Visit (HOSPITAL_COMMUNITY): Payer: Self-pay | Admitting: Pharmacist

## 2023-03-27 LAB — POCT INR: INR: 2.5 (ref 2.0–3.0)

## 2023-04-02 ENCOUNTER — Encounter (INDEPENDENT_AMBULATORY_CARE_PROVIDER_SITE_OTHER): Payer: Medicare Other | Admitting: Ophthalmology

## 2023-04-03 ENCOUNTER — Ambulatory Visit (HOSPITAL_COMMUNITY): Payer: Self-pay | Admitting: Pharmacist

## 2023-04-03 LAB — POCT INR: INR: 2.8 (ref 2.0–3.0)

## 2023-04-04 NOTE — Progress Notes (Unsigned)
  Electrophysiology Office Note:   Date:  04/08/2023  ID:  [name], DOB 1955-06-09, MRN [id number]  Primary Cardiologist: Olga Millers, MD Electrophysiologist: Hillis Range, MD (Inactive) ->  Dr. Lalla Brothers HF: Dr. Gala Romney   History of Present Illness:   Brandon [name] is a 67 y.o. male with h/o chronic systolic CHF s/p LVAD, ischemia CMP, Mod/Sev AS s/p AVR, HTN, and h/o endocarditis seen today for routine electrophysiology followup.   Pt last seen in our office 12/2020. Has been lost to follow up in setting of overall having his HF managed by the HF team 2/2 LVAD implantation 03/2021.  He is feeling great. No current complaints today. Remains very active s/p VAD. This week is his 2 year anniversary of LVAD.   Review of systems complete and found to be negative unless listed in HPI.     Studies Reviewed:    Barostim Interrogation-  Performed personally, see scanned report.  EKG is ordered today. Personal review as below.       Device History: Barostim (standard) implanted 12/31/2019 for Chronic systolic CHF St. Jude Single Chamber ICD implanted 08/2019 for chronic systolic CHF/primary prevention *Extracted 12/04/20 2/2 strep bacteremia* History of appropriate therapy: No History of AAD therapy: No   Physical Exam:   VS:  Pulse 64   Ht 6' (1.829 m)   Wt 208 lb 12.8 oz (94.7 kg)   SpO2 96%   BMI 28.32 kg/m    Wt Readings from Last 3 Encounters:  04/08/23 208 lb 12.8 oz (94.7 kg)  03/21/23 210 lb (95.3 kg)  03/14/23 209 lb (94.8 kg)     GEN: Well nourished, well developed in no acute distress NECK: No JVD; No carotid bruits CARDIAC: Regular rate and rhythm, no murmurs, rubs, gallops RESPIRATORY:  Clear to auscultation without rales, wheezing or rhonchi  ABDOMEN: Soft, non-tender, non-distended EXTREMITIES:  No edema; No deformity    ASSESSMENT AND PLAN:    Chronic systolic dysfunction s/p Abbott single chamber ICD   ICD extraction 2/2 endocarditis Barostim  implantation S/p LVAD 2022 NYHA II-III Volume status stable on exam  Stable on an appropriate medical regimen Normal ICD function Barostim impedance stable. Battery until 07/2024  CAD Recurrent anterior STEMI 04/2022 felt to be embolic from AV No s/s of ischemia.     PAF  On coumadin for LVAD Off amiodarone.   Disposition:   Follow up with EP APP in 6 months   Signed, Graciella Freer, PA-C

## 2023-04-07 ENCOUNTER — Encounter: Payer: Self-pay | Admitting: Gastroenterology

## 2023-04-08 ENCOUNTER — Encounter: Payer: Self-pay | Admitting: Student

## 2023-04-08 ENCOUNTER — Ambulatory Visit: Payer: Medicare Other | Attending: Student | Admitting: Student

## 2023-04-08 ENCOUNTER — Other Ambulatory Visit (HOSPITAL_COMMUNITY): Payer: Self-pay | Admitting: Internal Medicine

## 2023-04-08 VITALS — HR 64 | Ht 72.0 in | Wt 208.8 lb

## 2023-04-08 DIAGNOSIS — I5022 Chronic systolic (congestive) heart failure: Secondary | ICD-10-CM | POA: Diagnosis present

## 2023-04-08 DIAGNOSIS — I2510 Atherosclerotic heart disease of native coronary artery without angina pectoris: Secondary | ICD-10-CM | POA: Diagnosis present

## 2023-04-08 DIAGNOSIS — Z95811 Presence of heart assist device: Secondary | ICD-10-CM | POA: Insufficient documentation

## 2023-04-08 NOTE — Patient Instructions (Signed)
Medication Instructions:  Your physician recommends that you continue on your current medications as directed. Please refer to the Current Medication list given to you today.  *If you need a refill on your cardiac medications before your next appointment, please call your pharmacy*  Lab Work: None ordered If you have labs (blood work) drawn today and your tests are completely normal, you will receive your results only by: MyChart Message (if you have MyChart) OR A paper copy in the mail If you have any lab test that is abnormal or we need to change your treatment, we will call you to review the results  Follow-Up: At [facility], you and your health needs are our priority.  As part of our continuing mission to provide you with exceptional heart care, we have created designated Provider Care Teams.  These Care Teams include your primary Cardiologist (physician) and Advanced Practice Providers (APPs -  Physician Assistants and Nurse Practitioners) who all work together to provide you with the care you need, when you need it.  Your next appointment:   6 month(s)  Provider:   Michael "Andy" Tillery, PA-C  

## 2023-04-09 ENCOUNTER — Ambulatory Visit (HOSPITAL_COMMUNITY): Payer: Self-pay | Admitting: Pharmacist

## 2023-04-09 LAB — POCT INR: INR: 2.8 (ref 2.0–3.0)

## 2023-04-17 ENCOUNTER — Ambulatory Visit (HOSPITAL_COMMUNITY): Payer: Self-pay | Admitting: Pharmacist

## 2023-04-17 LAB — POCT INR: INR: 2.5 (ref 2.0–3.0)

## 2023-04-21 ENCOUNTER — Other Ambulatory Visit (HOSPITAL_COMMUNITY): Payer: Self-pay | Admitting: Internal Medicine

## 2023-04-22 ENCOUNTER — Other Ambulatory Visit: Payer: Self-pay

## 2023-04-22 ENCOUNTER — Other Ambulatory Visit (HOSPITAL_COMMUNITY): Payer: Self-pay

## 2023-04-22 MED ORDER — GABAPENTIN 300 MG PO CAPS
300.0000 mg | ORAL_CAPSULE | Freq: Three times a day (TID) | ORAL | 3 refills | Status: DC
  Filled 2023-04-22: qty 90, 30d supply, fill #0
  Filled 2023-05-21: qty 90, 30d supply, fill #1
  Filled 2023-06-20: qty 90, 30d supply, fill #2
  Filled 2023-07-16: qty 90, 30d supply, fill #3

## 2023-04-24 ENCOUNTER — Ambulatory Visit (HOSPITAL_COMMUNITY): Payer: Self-pay | Admitting: Pharmacist

## 2023-04-24 ENCOUNTER — Other Ambulatory Visit (HOSPITAL_COMMUNITY)
Admission: RE | Admit: 2023-04-24 | Discharge: 2023-04-24 | Disposition: A | Discharge status: Home / Self Care | Payer: Self-pay | Source: Ambulatory Visit | Attending: Oncology | Admitting: Oncology

## 2023-04-24 DIAGNOSIS — Z006 Encounter for examination for normal comparison and control in clinical research program: Secondary | ICD-10-CM | POA: Insufficient documentation

## 2023-04-24 LAB — POCT INR: INR: 3.1 — AB (ref 2.0–3.0)

## 2023-05-01 ENCOUNTER — Ambulatory Visit (HOSPITAL_COMMUNITY): Payer: Self-pay | Admitting: Pharmacist

## 2023-05-01 LAB — POCT INR: INR: 2.7 (ref 2.0–3.0)

## 2023-05-03 LAB — GENECONNECT MOLECULAR SCREEN: Genetic Analysis Overall Interpretation: NEGATIVE

## 2023-05-06 ENCOUNTER — Telehealth (HOSPITAL_COMMUNITY): Payer: Self-pay

## 2023-05-06 NOTE — Telephone Encounter (Signed)
Pt called complaining that his drive line has mild redness and tenderness at the exit site. Pt states " it feels like a cut is on it". Pt denies drainage or odor at site. Pt advised to come into VAD Clinic 12/26 for drive line check. Pt advised that if tenderness increases or he notices drainage to page VAD Coordinator.  Simmie Davies RN, BSN VAD Coordinator 24/7 Pager [phone]

## 2023-05-08 ENCOUNTER — Ambulatory Visit (HOSPITAL_COMMUNITY): Payer: Self-pay | Admitting: Student-PharmD

## 2023-05-08 ENCOUNTER — Ambulatory Visit (HOSPITAL_COMMUNITY)
Admission: RE | Admit: 2023-05-08 | Discharge: 2023-05-08 | Disposition: A | Discharge status: Home / Self Care | Payer: Medicare Other | Source: Ambulatory Visit | Attending: Cardiology | Admitting: Cardiology

## 2023-05-08 DIAGNOSIS — Z4801 Encounter for change or removal of surgical wound dressing: Secondary | ICD-10-CM | POA: Diagnosis not present

## 2023-05-08 DIAGNOSIS — Z95811 Presence of heart assist device: Secondary | ICD-10-CM | POA: Diagnosis present

## 2023-05-08 LAB — POCT INR: INR: 3.8 — AB (ref 2.0–3.0)

## 2023-05-08 LAB — PROTIME-INR
INR: 3.2 — ABNORMAL HIGH (ref 0.8–1.2)
Prothrombin Time: 33.3 s — ABNORMAL HIGH (ref 11.4–15.2)

## 2023-05-08 MED ORDER — DOXYCYCLINE HYCLATE 100 MG PO CAPS: 100.0000 mg | ORAL_CAPSULE | Freq: Two times a day (BID) | ORAL | 0 refills | Status: AC

## 2023-05-08 NOTE — Progress Notes (Addendum)
Pt presents to clinic today with concerns about his driveline. Pt states that he noticed that his driveline is tender. Pt denies any trauma, but states that he could have tugged on during his sleep.  Pt also tells me that his inr on his home machine this morning was 3.8. This is unusual for this pt. We will perform venipuncture today to ensure accuracy of his machine.   Drive Line: Existing VAD dressing removed and site care performed using sterile technique. Drive line exit site cleaned with Chlora prep applicators x 2, allowed to dry, and gauze dressing with Vashe moistenede 2 x 2 applied. Exit site with slight redness but incorporated, the velour is fully implanted at exit site. Slight tenderness, noe drainage, foul odor or rash noted. Drive line anchor re-applied on top of extra strip of Medipore tape. Pt given 7 daily dressing kits for home use. Pt instructed to change dressing 2x week until we see him 05/18/22.  Unable to get pic to load  Plan: Change dressing 2x weekly using daily kit Start Doxcycline 100 mg twice a day for 7 days Coumadin dosing per pharmacy. Return to clinic at your regular scheduled appt jan 6.  Carlton Adam RN, BSN VAD Coordinator 24/7 Pager [phone]

## 2023-05-08 NOTE — Addendum Note (Signed)
Encounter addended by: Lebron Quam, RN on: 05/08/2023 2:08 PM  Actions taken: Clinical Note Signed

## 2023-05-10 ENCOUNTER — Other Ambulatory Visit: Payer: Self-pay | Admitting: Family Medicine

## 2023-05-10 DIAGNOSIS — M456 Ankylosing spondylitis lumbar region: Secondary | ICD-10-CM

## 2023-05-12 ENCOUNTER — Other Ambulatory Visit (HOSPITAL_COMMUNITY): Payer: Self-pay | Admitting: *Deleted

## 2023-05-12 DIAGNOSIS — I5022 Chronic systolic (congestive) heart failure: Secondary | ICD-10-CM

## 2023-05-12 DIAGNOSIS — Z95811 Presence of heart assist device: Secondary | ICD-10-CM

## 2023-05-12 MED ORDER — METOPROLOL SUCCINATE ER 25 MG PO TB24: 25.0000 mg | ORAL_TABLET | Freq: Every day | ORAL | 3 refills | Status: DC

## 2023-05-15 ENCOUNTER — Ambulatory Visit (HOSPITAL_COMMUNITY): Payer: Self-pay | Admitting: Pharmacist

## 2023-05-15 LAB — POCT INR: INR: 3 (ref 2.0–3.0)

## 2023-05-16 ENCOUNTER — Other Ambulatory Visit (HOSPITAL_COMMUNITY): Payer: Self-pay | Admitting: Unknown Physician Specialty

## 2023-05-16 DIAGNOSIS — Z95811 Presence of heart assist device: Secondary | ICD-10-CM

## 2023-05-16 DIAGNOSIS — Z7901 Long term (current) use of anticoagulants: Secondary | ICD-10-CM

## 2023-05-19 ENCOUNTER — Encounter (HOSPITAL_COMMUNITY): Payer: Medicare Other | Admitting: Internal Medicine

## 2023-05-19 ENCOUNTER — Ambulatory Visit (HOSPITAL_COMMUNITY)
Admission: RE | Admit: 2023-05-19 | Discharge: 2023-05-19 | Disposition: A | Discharge status: Home / Self Care | Payer: Medicare Other | Source: Ambulatory Visit | Attending: Internal Medicine | Admitting: Internal Medicine

## 2023-05-19 ENCOUNTER — Telehealth: Payer: Self-pay

## 2023-05-19 ENCOUNTER — Encounter (HOSPITAL_COMMUNITY): Payer: Self-pay | Admitting: Unknown Physician Specialty

## 2023-05-19 ENCOUNTER — Other Ambulatory Visit: Payer: Self-pay | Admitting: Family Medicine

## 2023-05-19 ENCOUNTER — Ambulatory Visit (HOSPITAL_COMMUNITY): Payer: Self-pay | Admitting: Pharmacist

## 2023-05-19 VITALS — BP 116/0 | HR 74

## 2023-05-19 DIAGNOSIS — D649 Anemia, unspecified: Secondary | ICD-10-CM | POA: Diagnosis not present

## 2023-05-19 DIAGNOSIS — Z95811 Presence of heart assist device: Secondary | ICD-10-CM | POA: Diagnosis not present

## 2023-05-19 DIAGNOSIS — I5022 Chronic systolic (congestive) heart failure: Secondary | ICD-10-CM | POA: Diagnosis not present

## 2023-05-19 DIAGNOSIS — Z7901 Long term (current) use of anticoagulants: Secondary | ICD-10-CM | POA: Insufficient documentation

## 2023-05-19 DIAGNOSIS — I472 Ventricular tachycardia, unspecified: Secondary | ICD-10-CM

## 2023-05-19 DIAGNOSIS — Z4801 Encounter for change or removal of surgical wound dressing: Secondary | ICD-10-CM | POA: Insufficient documentation

## 2023-05-19 DIAGNOSIS — I2510 Atherosclerotic heart disease of native coronary artery without angina pectoris: Secondary | ICD-10-CM

## 2023-05-19 DIAGNOSIS — M456 Ankylosing spondylitis lumbar region: Secondary | ICD-10-CM

## 2023-05-19 DIAGNOSIS — Z952 Presence of prosthetic heart valve: Secondary | ICD-10-CM

## 2023-05-19 DIAGNOSIS — D500 Iron deficiency anemia secondary to blood loss (chronic): Secondary | ICD-10-CM

## 2023-05-19 LAB — FERRITIN: Ferritin: 44 ng/mL (ref 24–336)

## 2023-05-19 LAB — PROTIME-INR
INR: 2.9 — ABNORMAL HIGH (ref 0.8–1.2)
Prothrombin Time: 30.8 s — ABNORMAL HIGH (ref 11.4–15.2)

## 2023-05-19 LAB — CBC
HCT: 42.7 % (ref 39.0–52.0)
Hemoglobin: 13.5 g/dL (ref 13.0–17.0)
MCH: 26.5 pg (ref 26.0–34.0)
MCHC: 31.6 g/dL (ref 30.0–36.0)
MCV: 83.7 fL (ref 80.0–100.0)
Platelets: 169 10*3/uL (ref 150–400)
RBC: 5.1 MIL/uL (ref 4.22–5.81)
RDW: 20 % — ABNORMAL HIGH (ref 11.5–15.5)
WBC: 8.5 10*3/uL (ref 4.0–10.5)
nRBC: 0 % (ref 0.0–0.2)

## 2023-05-19 LAB — BASIC METABOLIC PANEL
Anion gap: 9 (ref 5–15)
BUN: 14 mg/dL (ref 8–23)
CO2: 26 mmol/L (ref 22–32)
Calcium: 9.4 mg/dL (ref 8.9–10.3)
Chloride: 100 mmol/L (ref 98–111)
Creatinine, Ser: 0.92 mg/dL (ref 0.61–1.24)
GFR, Estimated: >60 mL/min (ref >60)
Glucose, Bld: 124 mg/dL — ABNORMAL HIGH (ref 70–99)
Potassium: 4.4 mmol/L (ref 3.5–5.1)
Sodium: 135 mmol/L (ref 135–145)

## 2023-05-19 LAB — IRON AND TIBC
Iron: 36 ug/dL — ABNORMAL LOW (ref 45–182)
Saturation Ratios: 11 % — ABNORMAL LOW (ref 17.9–39.5)
TIBC: 322 ug/dL (ref 250–450)
UIBC: 286 ug/dL

## 2023-05-19 LAB — VITAMIN B12: Vitamin B-12: 283 pg/mL (ref 180–914)

## 2023-05-19 LAB — FOLATE: Folate: 10.4 ng/mL (ref >5.9)

## 2023-05-19 LAB — LACTATE DEHYDROGENASE: LDH: 165 U/L (ref 98–192)

## 2023-05-19 NOTE — Patient Instructions (Addendum)
 No medication changes today Coumadin  dosing per [facility] You have been given letter for TSA today. 4. See below for the closest hospital when you are in [place], [place]. January 31-February 2.  [facility] [phone] 31.6 miles  [address] [place], [place] , [address] [place] 

## 2023-05-19 NOTE — Telephone Encounter (Signed)
 Copied from CRM [id number]. Topic: Clinical - Medication Refill >> May 19, 2023 12:24 PM Corin V wrote: Most Recent Primary Care Visit:  Provider: TANDA ROJELIO ORN  Department: [facility]  Visit Type: MEDICARE AWV, SEQUENTIAL  Date: 07/26/2022  Medication: ***  Has the patient contacted their pharmacy?  (Agent: If no, request that the patient contact the pharmacy for the refill. If patient does not wish to contact the pharmacy document the reason why and proceed with request.) (Agent: If yes, when and what did the pharmacy advise?)  Is this the correct pharmacy for this prescription?  If no, delete pharmacy and type the correct one.  This is the patient's preferred pharmacy:  [id number] - [place], [place] - [address] [place] [address] Phone: [phone] Fax: [phone]  [id number] - [place], [place] - [address] [place] [address] Phone: [phone] Fax: [phone]  [id number] [place], [place] - [address] [place] [address] Phone: [phone] Fax: [phone]   Has the prescription been filled recently?   Is the patient out of the medication?   Has the patient been seen for an appointment in the last year OR does the patient have an upcoming appointment?   Can we respond through MyChart?   Agent: Please be advised that Rx refills may take up to 3 business days. We ask that you follow-up with your pharmacy.

## 2023-05-19 NOTE — Progress Notes (Signed)
 Patient presents to VAD Clinic for 2 month f/u alone. Reports no problems with VAD equipment or concerns with drive line.  Patient says he is feeling good. Pt received Fereheme 11/1 and 11/8 for low iron/hgb. Pt states that since receiving the IV Fereheme his SOB issues have completely resovled. His hgb is up to 13.5 today from 10.5 in October. Denies lightheadedness, dizziness, falls and signs of bleeding. Pt continues to work out 6 days a week at [employer]. Anemia panel drawn today. Reviewed with Lauren-pharm D, 2 doses of IV Fereheme ordered.   Pt did recently have some driveline trauma and was prescribed Doxy. His driveline is back to baseline today. See below for full documentation and pics.   Pt reports compliance with all medications. He tells me that he will be traveling to [place], [place] at the end of January for his nephews wedding. Pt was given a letter for TSA today as well as the closest VAD center to [facility].  Vital Signs:    Doppler Pressure: 116 Automatic BP: 113/88(97) HR: 74 NSR  SPO2: 98 % on RA   Weight: 212.6 w/ ept Last weight: 209.2 lb w/ eqt Ht: 6'   VAD Interrogation: Speed: 5800 Flow: 5.1 Power: 4.9w    PI: 3.8 Alarms: none Events: 20-40 PI events   Fixed speed: 5800 Low speed limit: 5500  Primary controller: back up battery due for replacement in 25 months Secondary controller:  back up battery due for replacement in 8 months    I reviewed the LVAD parameters from today and compared the results to the patient's prior recorded data. LVAD interrogation was NEGATIVE for significant power changes, NEGATIVE for clinical alarms and STABLE for PI events/speed drops. No programming changes were made and pump is functioning within specified parameters. Pt is performing daily controller and system monitor self tests along with completing weekly and monthly maintenance for LVAD equipment.   LVAD equipment check completed and is in good working order. Back-up equipment   present and charged at today's visit.   Annual Equipment Maintenance on UBC/PM was performed today 03/11/23.    Exit Site Care:  Existing VAD dressing removed and site care performed using sterile technique. Drive line exit site cleaned with Chlora prep applicators x 2, allowed to dry, and Sorbaview dressing with Silverlon patch applied. Exit site healed and incorporated, the velour is fully implanted at exit site. No redness, tenderness, drainage, foul odor or rash noted. Drive line anchor re-applied. Advance back to weekly dressings using the weekly kit. Pt denies fever or chills. Provided patient with 8 weekly dressing kits for home use.     Device: N/A  (removed due to infection 12/04/20)  BP & Labs:  Doppler 116 - Doppler is reflecting modified systolic   Hgb 13.5 - No S/S of bleeding. Specifically denies melena/BRBPR or nosebleeds.   LDH stable at 165 with established baseline of 135 - 309. Denies tea-colored urine. No power elevations noted on interrogation.   Patient Instructions:  No medication changes today Coumadin  dosing per [facility] Will schedule you for IV Atilano You have been given letter for TSA today.  See below for the closest hospital when you are in [place], [place]. January 31-February 2.  [facility] [phone] 31.6 miles  [address], [place], [place] , [address] [place]    Lauraine Ip RN,BSN VAD Coordinator  Office: [phone]  24/7 Pager: [phone]

## 2023-05-19 NOTE — Progress Notes (Addendum)
 LVAD Follow-up Clinic Note   PCP: Mercer Clotilda SAUNDERS, MD HF MD: DB   HPI:  Brandon [name] is a 68 year old male with history of tobacco use, CAD s/p previous anterior MI, severe systolic HF s/p HM-3 VAD 04/06/21, severe AS s/p AVR, endocarditis.    Admitted 12/20 with anterior ST elevation MI. Cath with chronically occluded RCA (L>>R collaterals) and thrombotic occlusion of proximal LAD treated with PCI. Echo w/ reduced LVEF 30-35% w/ apical aneurysm. RV ok. Post cath, he required milrinone  for cardiogenic shock. Subsequently underwent Barostim.   Underwent outpatient [facility] 11/29/20 as part of VAD w/u. Post procedure, developed severe rigors and fever. Admitted to ICU Blood Cx + strep. 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  Likely low-flow low gradient AS. Underwent ICD extraction..    Admitted for cardiogenic shock in 10/22.  He underwent placement of HM3 VAD and aortic valve replacement with 25 mm Edwards Inspiris Resilia valve on 04/06/21. Surgical path came back 11/28 with evidence of possible acute aortic valve endocarditis. 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 Discharged 05/09/21.   He had a complicated month of December.  On 04/22/2022 he presented with shortness of breath and diaphoresis.  Found to be in ventricular fibrillation.  Was defibrillated.  Then on Christmas day he presented back with chest pain, nausea, and shortness of breath.  ECG showed ST elevation.  Catheterization showed stable CAD. Cardiac CT suspicious for clot on AoV. INR increased to 2.5-3.0 range.   Had PET 1/24 which showed increased size of the spiculated LUL pulmonary nodule which is hypermetabolic concerning for primary bronchogenic carcinoma. Has seen Dr. Kerrin and referred  for stereotactic radiation without biopsy.   Admitted on 02/05/23 with anemia hgb was 10.3. INR 2.0. He was given a bolus of IVFs. Protonix  increased to 40 mg bid. GI consulted. On 9/26, underwent EGD showing 1 duodenal AVM which was treated APC. During admission, episode of hypotension requiring additional 500cc bolus IVF. Epleronone decreases to 25mg  daily at discharge. Intermittent runs of VT    Follow up for Heart Failure/LVAD: Doing great. Feels much better after getting IV iron.  Continues to go to gym on a regular basis. No CP or SOB. Denies orthopnea or PND. No fevers, chills or problems with driveline. No bleeding, melena or neuro symptoms. No VAD alarms. Taking all meds as prescribed.    VAD Interrogation: Speed: 5800 Flow: 5.1 Power: 4.9w    PI: 3.8 Alarms: none Events: 20-40 PI events   Fixed speed: 5800 Low speed limit: 5500   Primary controller: back up battery due for replacement in 25 months Secondary controller:  back up battery due for replacement in 8 months    I reviewed the LVAD parameters from today and compared the results to the patient's prior recorded data. LVAD interrogation was NEGATIVE for significant power changes, NEGATIVE for clinical alarms and STABLE for PI events/speed drops. No programming changes were made and pump is functioning within specified parameters. Pt is performing daily controller and system monitor self tests along with completing weekly and monthly maintenance for LVAD equipment.   LVAD equipment check completed and is in good working order. Back-up equipment  present and charged at today's visit.   Annual Equipment Maintenance on UBC/PM was performed today 03/11/23.    Past  Medical History:  Diagnosis Date   AICD (automatic cardioverter/defibrillator) present 08/31/2019   Ankylosing spondylitis (HCC)    Arthritis    BENIGN PROSTATIC HYPERTROPHY 06/07/2008   CHF (congestive heart failure) (HCC)    COLONIC POLYPS, HX OF 06/07/2008    Coronary artery disease    Depression    H/O hiatal hernia    Heart failure (HCC)    History of radiation therapy    Left Lung- 08/06/22-08/16/22- Dr. Lynwood Nasuti   HYPERLIPIDEMIA 06/07/2008   HYPERTENSION 06/07/2008   Myocardial infarction [facility]) 2005   NSTEMI, s/p LAD stent   NEPHROLITHIASIS, HX OF 06/07/2008   STEMI (ST elevation myocardial infarction) (HCC) 04/27/2019    Current Outpatient Medications  Medication Sig Dispense Refill   acetaminophen  (TYLENOL ) 500 MG tablet Take 500-1,000 mg by mouth every 6 (six) hours as needed (for pain).     atorvastatin  (LIPITOR ) 80 MG tablet PATIENT MUST ATTEND ANNUAL APPOINTMENT FOR FUTURE REFILLS TAKE 1 TABLET BY MOUTH ONCE DAILY AT 6PM. 90 tablet 1   Carboxymethylcellulose Sodium (ARTIFICIAL TEARS OP) Place 1 drop into both eyes daily as needed (for dryness or irritation).     diphenhydrAMINE  (BENADRYL ) 25 mg capsule Take 25 mg by mouth every 8 (eight) hours as needed.     eplerenone  (INSPRA ) 25 MG tablet Take 2 tablets (50 mg total) by mouth in the morning. 30 tablet 5   ezetimibe  (ZETIA ) 10 MG tablet TAKE ONE TABLET BY MOUTH DAILY 90 tablet 3   gabapentin  (NEURONTIN ) 300 MG capsule Take 1 capsule (300 mg total) by mouth 3 (three) times daily. 90 capsule 3   metoprolol  succinate (TOPROL -XL) 25 MG 24 hr tablet Take 1 tablet (25 mg total) by mouth daily. 90 tablet 3   pantoprazole  (PROTONIX ) 40 MG tablet TAKE 1 TABLET BY MOUTH TWICE A DAY 180 tablet 2   sertraline  (ZOLOFT ) 50 MG tablet TAKE 1/2 TAB BY MOUTH DAILY FOR 1 WEEK, THEN INCREASE TO 1 TABLET DAILY. (Patient taking differently: Take 50 mg by mouth daily. Pt taking 50 mg daily) 90 tablet 3   tamsulosin  (FLOMAX ) 0.4 MG CAPS capsule TAKE 1 CAPSULE BY MOUTH EVERY DAY 90 capsule 1   traMADol  (ULTRAM ) 50 MG tablet TAKE 1-2 TABLETS BY MOUTH TWICE A DAY FOR 30 DAYS 120 tablet 1   warfarin (COUMADIN ) 2.5 MG tablet TAKE 7.5 MG (3 TABLETS) EVERY MONDAY/FRIDAY AND 5 MG (2 TABLETS) ALL OTHER DAYS OR  AS INSTRUCTED BY LVAD CLINIC. (Patient taking differently: Take two of the 2.5 mg tablets for total of 5mg  on Mondays and all other days take 3 of the 2.5 mg tablets for total of 7.5mg  per patient) 180 tablet 11   ZYRTEC ALLERGY 10 MG tablet Take 10 mg by mouth daily.     nystatin  (MYCOSTATIN /NYSTOP ) powder Apply 1 Application topically 3 (three) times daily. (Patient not taking: Reported on 05/19/2023) 15 g 2   potassium chloride  SA (KLOR-CON  M20) 20 MEQ tablet Take 1 tablet as needed when taking Torsemide  (Patient not taking: Reported on 05/19/2023)     torsemide  (DEMADEX ) 20 MG tablet Take 1 tablet (20 mg total) by mouth as needed. (Patient not taking: Reported on 05/19/2023) 30 tablet 5   No current facility-administered medications for this encounter.    Patient has no known allergies.   Vital Signs:     Doppler Pressure: 116 Automatic BP: 113/88(97) HR: 74 NSR     SPO2: 98 % on RA   Weight: 212.6 w/ ept Last weight:  209.2 lb w/ eqt Ht: 6'    Physical Exam: General:  NAD.  HEENT: normal  Neck: supple. JVP not elevated.  Carotids 2+ bilat; no bruits. No lymphadenopathy or thryomegaly appreciated. Cor: LVAD hum.  Lungs: Clear. Abdomen: soft, nontender, non-distended. No hepatosplenomegaly. No bruits or masses. Good bowel sounds. Driveline site clean. Anchor in place.  Extremities: no cyanosis, clubbing, rash. Warm no edema  Neuro: alert & oriented x 3. No focal deficits. Moves all 4 without problem    ASSESSMENT AND PLAN:  1. Chronic systolic HFr EF due to iCM - Echo 05/19/20 EF 20-25% RV mildly HK. moderate AS  Mean gradient 13 AVA 1.2 cm2 DI 0.30 - s/p HM-III VAD + bioprosthetic AVR 04/06/21 - Episode of cardiogenic shock and anterior ST elevation 12/23. Cath with patent LAD stent. Cardiac CT suggestive of clot on aortic valve. Felt to be possible embolic event  - Doing great NYHA I  -- Volume status ok - Continue Toprol  12.5 daily - Continue eplerenone  50 mg daily   2. HM-3  LVAD implant - VAD interrogated personally. Parameters stable. - Ramp echo 12/30/22: LVEF < 20% LVIDd 6.1cm. Cannula well placed. RV mildly HK. AoV not opening. No AI. Septum midline at 5800. No change in speed - ASA resumed due to previous thrombotic episode off AoV - INR 2.9 Goal 2.5-3.0 due to clotting on AoV and possible coronary embolism 12/23 Discussed warfarin dosing with PharmD personally. - LDH 166 - Hgb 12.4 -> 10.5 -> 13.5. (improved with IV iron) - MAPs slightly elevated but ok - DL site ok,  3. VT/VF 12/23 - remains in NSR.  - No recent recurrence  4. Acute upper GI bleed 9/24 with iron-deficiency anemia - 02/06/23 underwent EGD showing 1 duodenal AVM which was treated APC - Hgb improved wit IV iron in 12/24  5. Severe low-flow aortic stenosis s/p AVR - s/p VAD/bioprosthetic AVR - no change   6. CAD  - s/p anterior STEMI (12/20). LHC showed chronically occluded RCA (with L>>R collaterals) and thrombotic occlusion of proximal LAD. Underwent PCI of LAD. - Now s/p VAD - Recurrent anterior STEMI in 12/23 Cath 12/23 LAD stent patent. Felt to be embolic from aortic valve - Continue atorvastatin  80.  - Continue ASA 81 with previous thrombotic episode off AoV - No s/s angina   7. Paroxysmal Atrial Fibrillation w/ RVR - Remains in NSR - Off amio. On warfarin   8. LUL Bronchogenic CA - h/o tobacco use - Has seen Dr. Kerrin.  - s/p XRT 5/24 - continue to follow with oncology  9. Ascending aortic aneurysm - 4.1 cm on CT 8/24. Will follow - provided reassurance that this is just minimally dilated.   I spent a total of 40 minutes today: 1) reviewing the patient's medical records including previous charts, labs and recent notes from other providers; 2) examining the patient and counseling them on their medical issues/explaining the plan of care; 3) adjusting meds as needed and 4) ordering lab work or other needed tests.     Toribio Fuel, MD  5:04 PM

## 2023-05-19 NOTE — Telephone Encounter (Signed)
 Copied from CRM [id number]. Topic: Clinical - Medication Refill >> May 19, 2023 12:24 PM Corin V wrote: Most Recent Primary Care Visit:  Provider: TANDA ROJELIO ORN  Department: [facility]  Visit Type: MEDICARE AWV, SEQUENTIAL  Date: 07/26/2022  Medication: traMADol  (ULTRAM ) 50 MG tablet  Has the patient contacted their pharmacy? Yes (Agent: If no, request that the patient contact the pharmacy for the refill. If patient does not wish to contact the pharmacy document the reason why and proceed with request.) (Agent: If yes, when and what did the pharmacy advise?)  Is this the correct pharmacy for this prescription? Yes If no, delete pharmacy and type the correct one.  This is the patient's preferred pharmacy:  [id number] [place], [place] - [address] [place] [address] Phone: [phone] Fax: [phone]   Has the prescription been filled recently? No  Is the patient out of the medication? No  Has the patient been seen for an appointment in the last year OR does the patient have an upcoming appointment? Yes  Can we respond through MyChart? Yes  Agent: Please be advised that Rx refills may take up to 3 business days. We ask that you follow-up with your pharmacy.

## 2023-05-21 ENCOUNTER — Other Ambulatory Visit (HOSPITAL_COMMUNITY): Payer: Self-pay

## 2023-05-21 MED ORDER — TRAMADOL HCL 50 MG PO TABS: ORAL_TABLET | ORAL | 0 refills | Status: DC

## 2023-05-23 ENCOUNTER — Telehealth: Payer: Self-pay | Admitting: *Deleted

## 2023-05-23 NOTE — Telephone Encounter (Signed)
 CALLED PATIENT TO ASK ABOUT RESCHEDULING FU APPT. ON 06-30-23 DUE TO DR. KINARD BEING OFF, SPOKE WITH PATIENT AND HE AGREED TO COME FOR FU APPT. WITH DR. KINARD ON   07-03-23 @ 8:15 AM, APPT. HAS BEEN BOOKED

## 2023-05-26 ENCOUNTER — Encounter (HOSPITAL_COMMUNITY)
Admission: RE | Admit: 2023-05-26 | Discharge: 2023-05-26 | Disposition: A | Discharge status: Home / Self Care | Payer: Medicare Other | Source: Ambulatory Visit | Attending: Internal Medicine | Admitting: Internal Medicine

## 2023-05-26 DIAGNOSIS — D649 Anemia, unspecified: Secondary | ICD-10-CM | POA: Insufficient documentation

## 2023-05-26 MED ORDER — SODIUM CHLORIDE 0.9 % IV SOLN
510.0000 mg | INTRAVENOUS | Status: DC
  Administered 2023-05-26: 510 mg via INTRAVENOUS
  Filled 2023-05-26: qty 510

## 2023-05-28 ENCOUNTER — Other Ambulatory Visit (HOSPITAL_COMMUNITY): Payer: Self-pay | Admitting: Internal Medicine

## 2023-05-28 DIAGNOSIS — I5022 Chronic systolic (congestive) heart failure: Secondary | ICD-10-CM

## 2023-05-28 DIAGNOSIS — F32A Depression, unspecified: Secondary | ICD-10-CM

## 2023-05-28 DIAGNOSIS — Z95811 Presence of heart assist device: Secondary | ICD-10-CM

## 2023-05-29 ENCOUNTER — Ambulatory Visit (HOSPITAL_COMMUNITY): Payer: Self-pay | Admitting: Pharmacist

## 2023-05-29 LAB — POCT INR: INR: 2.7 (ref 2.0–3.0)

## 2023-06-02 ENCOUNTER — Encounter (HOSPITAL_COMMUNITY)
Admission: RE | Admit: 2023-06-02 | Discharge: 2023-06-02 | Disposition: A | Discharge status: Home / Self Care | Payer: Medicare Other | Source: Ambulatory Visit | Attending: Internal Medicine | Admitting: Internal Medicine

## 2023-06-02 DIAGNOSIS — D649 Anemia, unspecified: Secondary | ICD-10-CM | POA: Diagnosis not present

## 2023-06-02 MED ORDER — SODIUM CHLORIDE 0.9 % IV SOLN
510.0000 mg | INTRAVENOUS | Status: DC
  Administered 2023-06-02: 510 mg via INTRAVENOUS
  Filled 2023-06-02: qty 510

## 2023-06-03 ENCOUNTER — Other Ambulatory Visit (HOSPITAL_BASED_OUTPATIENT_CLINIC_OR_DEPARTMENT_OTHER): Payer: Self-pay

## 2023-06-05 ENCOUNTER — Ambulatory Visit (HOSPITAL_COMMUNITY): Payer: Self-pay | Admitting: Pharmacist

## 2023-06-05 LAB — POCT INR: INR: 2.8 (ref 2.0–3.0)

## 2023-06-12 ENCOUNTER — Telehealth (HOSPITAL_COMMUNITY): Payer: Self-pay

## 2023-06-12 ENCOUNTER — Ambulatory Visit (HOSPITAL_COMMUNITY): Payer: Self-pay | Admitting: Pharmacist

## 2023-06-12 ENCOUNTER — Telehealth (HOSPITAL_COMMUNITY): Payer: Self-pay | Admitting: Pharmacist

## 2023-06-12 LAB — POCT INR: INR: 2.8 (ref 2.0–3.0)

## 2023-06-12 NOTE — Telephone Encounter (Signed)
Pt called stating eye doctor told him he no longer needed to take inspra as this medication is not helping. Pt was told to ask his cardiologist if he should stay on medication  Pt advised to continue inspra. Pt also states he was givien two new eye drops Dorzolminde HCL 2% and Ketorolac 0.5% and wanted to know if they safe to use. Lauren will call and advise patient further.

## 2023-06-12 NOTE — Telephone Encounter (Signed)
Received call from patient that his optometrist at [facility] started him on Dorzolamide 2% and Ketorolac 2% eye drops for "fluid build up behind his eye". Asking if these are safe to take.   Informed him that there are no issues with Dorzolamide and he can start. Ketorolac is an NSAID and would be concerned for NSAID-related side effects through systemic absorption. However, package insert for the ophthalmic form does not list any know cardiac adverse events and systemic absorption is only 5-10% with these eye drops. Discussed with Dr. Gala Romney and he is ok with the patient taking both eye drops. Informed patient and LVAD coordinators.   Karle Plumber, PharmD, BCPS, BCCP, CPP Heart Failure Clinic Pharmacist [phone]

## 2023-06-19 ENCOUNTER — Ambulatory Visit (HOSPITAL_COMMUNITY): Payer: Self-pay | Admitting: Pharmacist

## 2023-06-19 LAB — POCT INR: INR: 2.5 (ref 2.0–3.0)

## 2023-06-20 ENCOUNTER — Other Ambulatory Visit (HOSPITAL_COMMUNITY): Payer: Self-pay

## 2023-06-25 ENCOUNTER — Ambulatory Visit (HOSPITAL_COMMUNITY)
Admission: RE | Admit: 2023-06-25 | Discharge: 2023-06-25 | Disposition: A | Discharge status: Home / Self Care | Payer: Medicare Other | Source: Ambulatory Visit | Attending: Radiation Oncology | Admitting: Radiation Oncology

## 2023-06-25 DIAGNOSIS — R911 Solitary pulmonary nodule: Secondary | ICD-10-CM | POA: Insufficient documentation

## 2023-06-26 ENCOUNTER — Ambulatory Visit (HOSPITAL_COMMUNITY): Payer: Self-pay | Admitting: Pharmacist

## 2023-06-26 LAB — POCT INR: INR: 3 (ref 2.0–3.0)

## 2023-06-30 ENCOUNTER — Ambulatory Visit: Payer: Self-pay | Admitting: Radiation Oncology

## 2023-07-02 ENCOUNTER — Ambulatory Visit: Payer: Medicare Other | Admitting: Gastroenterology

## 2023-07-03 ENCOUNTER — Ambulatory Visit: Payer: Medicare Other | Admitting: Radiation Oncology

## 2023-07-03 ENCOUNTER — Ambulatory Visit (HOSPITAL_COMMUNITY): Payer: Self-pay | Admitting: Pharmacist

## 2023-07-03 LAB — POCT INR: INR: 2.5 (ref 2.0–3.0)

## 2023-07-09 NOTE — Progress Notes (Signed)
 Radiation Oncology         [phone] ________________________________  Name: Brandon [name] MRN: [id number]  Date: 07/10/2023  DOB: March 22, 1956  Follow-Up Visit Note  CC: Deeann Saint, MD  Charlett Lango C, *  No diagnosis found.  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 A diagnosis of Lung nodule was also pertinent to this visit.   PET avid left upper lobe pulmonary nodule       Interval Since Last Radiation: 10 months and 23 days    Indication for treatment:  Curative        Radiation treatment dates: from 08/06/22 through 08/16/22   Site/dose: Left lung - treated in total with 50 Gy delivered in 5 Fx at 10 Gy/Fx Beams/energy: 6X-FFF   Narrative:  The patient returns today for routine follow-up and to review recent imaging. He was last seen here for follow-up on 12-23-22. Since last visit, he continued to follow up specialist to manage his chronic conditions. He underwent an EGD on 02-06-23 under the care of Dr. Norwood Levo which was unremarkable. Patient also presented for a retinal evaluation on 06-12-23 to discuss treatment plans of central serous chorioretinopathy of left eye.     Most recent CT chest done on 06-25-23 showed evolving radiation changes in the left upper lobe with stable underlying 6 mm left upper lobe nodule. New focal consolidation inferiorly in the lingula, not clearly explained as representing artifact from the nearby LVAD is also noted on exam, further attention is recommended if real. No evidence of local recurrence or metastatic disease was indicated on exam.   No other significant oncologic interval history since the patient was last seen.   Allergies:  has no known allergies.  Meds: Current Outpatient Medications  Medication Sig Dispense Refill   acetaminophen (TYLENOL) 500 MG tablet Take 500-1,000 mg by mouth every 6 (six) hours as needed (for pain).     atorvastatin (LIPITOR)  80 MG tablet PATIENT MUST ATTEND ANNUAL APPOINTMENT FOR FUTURE REFILLS TAKE 1 TABLET BY MOUTH ONCE DAILY AT 6PM. 90 tablet 1   Carboxymethylcellulose Sodium (ARTIFICIAL TEARS OP) Place 1 drop into both eyes daily as needed (for dryness or irritation).     diphenhydrAMINE (BENADRYL) 25 mg capsule Take 25 mg by mouth every 8 (eight) hours as needed.     eplerenone (INSPRA) 25 MG tablet Take 2 tablets (50 mg total) by mouth in the morning. 30 tablet 5   ezetimibe (ZETIA) 10 MG tablet TAKE ONE TABLET BY MOUTH DAILY 90 tablet 3   gabapentin (NEURONTIN) 300 MG capsule Take 1 capsule (300 mg total) by mouth 3 (three) times daily. 90 capsule 3   metoprolol succinate (TOPROL-XL) 25 MG 24 hr tablet Take 1 tablet (25 mg total) by mouth daily. 90 tablet 3   nystatin (MYCOSTATIN/NYSTOP) powder Apply 1 Application topically 3 (three) times daily. (Patient not taking: Reported on 05/19/2023) 15 g 2   pantoprazole (PROTONIX) 40 MG tablet TAKE 1 TABLET BY MOUTH TWICE A DAY 180 tablet 2   potassium chloride SA (KLOR-CON M20) 20 MEQ tablet Take 1 tablet as needed when taking Torsemide (Patient not taking: Reported on 05/19/2023)     sertraline (ZOLOFT) 50 MG tablet Take 1 tablet (50 mg total) by mouth daily. Pt taking 50 mg daily 90 tablet 3   tamsulosin (FLOMAX) 0.4 MG CAPS capsule TAKE 1 CAPSULE BY MOUTH EVERY DAY 90 capsule 1  torsemide (DEMADEX) 20 MG tablet Take 1 tablet (20 mg total) by mouth as needed. (Patient not taking: Reported on 05/19/2023) 30 tablet 5   traMADol (ULTRAM) 50 MG tablet TAKE 1-2 TABLETS BY MOUTH TWICE A DAY FOR 30 DAYS 60 tablet 0   warfarin (COUMADIN) 2.5 MG tablet TAKE 7.5 MG (3 TABLETS) EVERY MONDAY/FRIDAY AND 5 MG (2 TABLETS) ALL OTHER DAYS OR AS INSTRUCTED BY LVAD CLINIC. (Patient taking differently: Take two of the 2.5 mg tablets for total of 5mg  on Mondays and all other days take 3 of the 2.5 mg tablets for total of 7.5mg  per patient) 180 tablet 11   ZYRTEC ALLERGY 10 MG tablet Take 10 mg  by mouth daily.     No current facility-administered medications for this encounter.    Physical Findings: The patient is in no acute distress. Patient is alert and oriented.  vitals were not taken for this visit. .  No significant changes. Lungs are clear to auscultation bilaterally. Heart has regular rate and rhythm. No palpable cervical, supraclavicular, or axillary adenopathy. Abdomen soft, non-tender, normal bowel sounds.   Lab Findings: Lab Results  Component Value Date   WBC 8.5 05/19/2023   HGB 13.5 05/19/2023   HCT 42.7 05/19/2023   MCV 83.7 05/19/2023   PLT 169 05/19/2023    Radiographic Findings: CT CHEST WO CONTRAST Result Date: 07/09/2023 CLINICAL DATA:  Non-small cell lung cancer. Assess treatment response. * Tracking Code: BO * EXAM: CT CHEST WITHOUT CONTRAST TECHNIQUE: Multidetector CT imaging of the chest was performed following the standard protocol without IV contrast. RADIATION DOSE REDUCTION: This exam was performed according to the departmental dose-optimization program which includes automated exposure control, adjustment of the mA and/or kV according to patient size and/or use of iterative reconstruction technique. COMPARISON:  Chest CT 12/17/2022 and 06/12/2022.  PET-CT 06/12/2022. FINDINGS: Cardiovascular: Again demonstrated is atherosclerosis of the aorta, great vessels and coronary arteries status post median sternotomy and aortic valve replacement. The heart size is stable. Left ventricular assist device in place. No significant pericardial fluid. Mediastinum/Nodes: There are no enlarged mediastinal, hilar or axillary lymph nodes.Hilar assessment is limited by the lack of intravenous contrast, although the hilar contours appear unchanged. The thyroid gland, trachea and esophagus demonstrate no significant findings. Probable right-sided hypoglossal nerve stimulator. Lungs/Pleura: No pleural effusion or pneumothorax. Moderate centrilobular emphysema with evolving  radiation changes in the left upper lobe, partially obscuring the left upper lobe nodule which measures approximately 6 mm on image 42/5 (unchanged). No new or enlarging nodules identified. However, there is new focal consolidation inferiorly in the lingula (image 130/5), not clearly explained as representing artifact from the nearby LVAD. Upper abdomen: The visualized upper abdomen appears stable, without significant findings. Musculoskeletal/Chest wall: There is no chest wall mass or suspicious osseous finding. Healed median sternotomy. Diffuse syndesmophytes throughout the spine consistent with ankylosing spondylitis. IMPRESSION: 1. Evolving radiation changes in the left upper lobe. Stable underlying 6 mm left upper lobe nodule. 2. No evidence of local recurrence or metastatic disease. 3. New focal consolidation inferiorly in the lingula, not clearly explained as representing artifact from the nearby LVAD. If real, this is likely infectious/inflammatory. Recommend attention on follow-up. 4. Aortic Atherosclerosis (ICD10-I70.0) and Emphysema (ICD10-J43.9). Electronically Signed   By: Carey Bullocks M.D.   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 A diagnosis of Lung nodule was also pertinent to this visit.  The  patient is recovering from the effects of radiation.  ***  Plan:  ***   *** minutes of total time was spent for this patient encounter, including preparation, face-to-face counseling with the patient and coordination of care, physical exam, and documentation of the encounter. ____________________________________  Billie Lade, PhD, MD  This document serves as a record of services personally performed by Antony Blackbird, MD. It was created on his behalf by Herbie Saxon, a trained medical scribe. The creation of this record is based on the scribe's personal observations and the provider's statements to them.  This document has been checked and approved by the attending provider.

## 2023-07-10 ENCOUNTER — Ambulatory Visit
Admission: RE | Admit: 2023-07-10 | Discharge: 2023-07-10 | Disposition: A | Discharge status: Home / Self Care | Payer: Medicare Other | Source: Ambulatory Visit | Attending: Radiation Oncology | Admitting: Radiation Oncology

## 2023-07-10 ENCOUNTER — Other Ambulatory Visit: Payer: Self-pay | Admitting: Internal Medicine

## 2023-07-10 ENCOUNTER — Encounter: Payer: Self-pay | Admitting: Radiation Oncology

## 2023-07-10 ENCOUNTER — Ambulatory Visit (HOSPITAL_COMMUNITY): Payer: Self-pay | Admitting: Pharmacist

## 2023-07-10 VITALS — BP 110/98 | HR 62 | Temp 97.0°F | Resp 18 | Ht 72.0 in | Wt 211.4 lb

## 2023-07-10 DIAGNOSIS — Z923 Personal history of irradiation: Secondary | ICD-10-CM | POA: Insufficient documentation

## 2023-07-10 DIAGNOSIS — I700 Atherosclerosis of aorta: Secondary | ICD-10-CM | POA: Insufficient documentation

## 2023-07-10 DIAGNOSIS — Z952 Presence of prosthetic heart valve: Secondary | ICD-10-CM | POA: Diagnosis not present

## 2023-07-10 DIAGNOSIS — J432 Centrilobular emphysema: Secondary | ICD-10-CM | POA: Insufficient documentation

## 2023-07-10 DIAGNOSIS — R911 Solitary pulmonary nodule: Secondary | ICD-10-CM | POA: Insufficient documentation

## 2023-07-10 DIAGNOSIS — Z79899 Other long term (current) drug therapy: Secondary | ICD-10-CM | POA: Insufficient documentation

## 2023-07-10 DIAGNOSIS — Z9889 Other specified postprocedural states: Secondary | ICD-10-CM | POA: Insufficient documentation

## 2023-07-10 LAB — POCT INR: INR: 2.3 (ref 2.0–3.0)

## 2023-07-10 NOTE — Progress Notes (Signed)
 Brandon [name] is here today for follow up post radiation to the lung.  Lung Side:  Left,patient completed treatment on 08/16/22  Does the patient complain of any of the following: Pain:No Shortness of breath w/wo exertion: Yes, mostly on exertion. Cough: No Hemoptysis: No Pain with swallowing: no Swallowing/choking concerns: no Appetite: Good  Energy Level: Good  Post radiation skin Changes: No     Additional comments if applicable: BP (!) 110/98   Pulse 62   Temp (!) 97 F (36.1 C)   Resp 18   Ht 6' (1.829 m)   Wt 211 lb 6.4 oz (95.9 kg)   SpO2 98%   BMI 28.67 kg/m

## 2023-07-11 ENCOUNTER — Other Ambulatory Visit: Payer: Self-pay | Admitting: Radiology

## 2023-07-11 DIAGNOSIS — C3412 Malignant neoplasm of upper lobe, left bronchus or lung: Secondary | ICD-10-CM

## 2023-07-14 ENCOUNTER — Telehealth (HOSPITAL_COMMUNITY): Payer: Self-pay

## 2023-07-14 NOTE — Telephone Encounter (Signed)
 Patient called stating that for the last 3-4 days he has had increased shortness of breath with minimal exertion. He wants to know if there is anything he can do to help alleviate his symptoms before his appointment on Friday 07/18/23. Please advise.

## 2023-07-14 NOTE — Telephone Encounter (Signed)
 Received message from CMA stating pt has been SOB for the past 4-5 days. Pt tells me that it is mostly when he is up walking around. He denies any blood in his stools or black, dark stools. He has Torsemide 20 mg at home but has not taken one. Pt advised to take a torsemide today. Pt has an appt with [place] Friday, we will add an anemia panel to this visit. Pt instructed that if he doesn't feel better after taking torsemide today we can work him in tomorrow. Discussed the above information with DR Bensimhon.  Carlton Adam RN, BSN VAD Coordinator 24/7 Pager [phone]

## 2023-07-15 ENCOUNTER — Other Ambulatory Visit (HOSPITAL_COMMUNITY): Payer: Self-pay | Admitting: Internal Medicine

## 2023-07-15 DIAGNOSIS — I5023 Acute on chronic systolic (congestive) heart failure: Secondary | ICD-10-CM

## 2023-07-15 DIAGNOSIS — E785 Hyperlipidemia, unspecified: Secondary | ICD-10-CM

## 2023-07-16 ENCOUNTER — Other Ambulatory Visit (HOSPITAL_COMMUNITY): Payer: Self-pay

## 2023-07-16 DIAGNOSIS — Z7901 Long term (current) use of anticoagulants: Secondary | ICD-10-CM

## 2023-07-16 DIAGNOSIS — Z95811 Presence of heart assist device: Secondary | ICD-10-CM

## 2023-07-17 ENCOUNTER — Ambulatory Visit (HOSPITAL_COMMUNITY): Payer: Self-pay | Admitting: Pharmacist

## 2023-07-17 LAB — POCT INR: INR: 3 (ref 2.0–3.0)

## 2023-07-18 ENCOUNTER — Ambulatory Visit (HOSPITAL_COMMUNITY)
Admission: RE | Admit: 2023-07-18 | Discharge: 2023-07-18 | Disposition: A | Discharge status: Home / Self Care | Payer: Medicare Other | Source: Ambulatory Visit | Attending: Internal Medicine | Admitting: Internal Medicine

## 2023-07-18 VITALS — BP 100/0 | HR 70 | Wt 211.5 lb

## 2023-07-18 DIAGNOSIS — Z9889 Other specified postprocedural states: Secondary | ICD-10-CM | POA: Insufficient documentation

## 2023-07-18 DIAGNOSIS — Z953 Presence of xenogenic heart valve: Secondary | ICD-10-CM | POA: Insufficient documentation

## 2023-07-18 DIAGNOSIS — I5022 Chronic systolic (congestive) heart failure: Secondary | ICD-10-CM | POA: Diagnosis not present

## 2023-07-18 DIAGNOSIS — C3412 Malignant neoplasm of upper lobe, left bronchus or lung: Secondary | ICD-10-CM | POA: Insufficient documentation

## 2023-07-18 DIAGNOSIS — Z87891 Personal history of nicotine dependence: Secondary | ICD-10-CM | POA: Diagnosis not present

## 2023-07-18 DIAGNOSIS — Z923 Personal history of irradiation: Secondary | ICD-10-CM | POA: Insufficient documentation

## 2023-07-18 DIAGNOSIS — Z79899 Other long term (current) drug therapy: Secondary | ICD-10-CM | POA: Insufficient documentation

## 2023-07-18 DIAGNOSIS — I2510 Atherosclerotic heart disease of native coronary artery without angina pectoris: Secondary | ICD-10-CM

## 2023-07-18 DIAGNOSIS — Z8616 Personal history of COVID-19: Secondary | ICD-10-CM | POA: Diagnosis not present

## 2023-07-18 DIAGNOSIS — Z95811 Presence of heart assist device: Secondary | ICD-10-CM

## 2023-07-18 DIAGNOSIS — I7121 Aneurysm of the ascending aorta, without rupture: Secondary | ICD-10-CM | POA: Insufficient documentation

## 2023-07-18 DIAGNOSIS — I110 Hypertensive heart disease with heart failure: Secondary | ICD-10-CM | POA: Insufficient documentation

## 2023-07-18 DIAGNOSIS — I472 Ventricular tachycardia, unspecified: Secondary | ICD-10-CM

## 2023-07-18 DIAGNOSIS — I480 Paroxysmal atrial fibrillation: Secondary | ICD-10-CM | POA: Insufficient documentation

## 2023-07-18 DIAGNOSIS — Z7182 Exercise counseling: Secondary | ICD-10-CM | POA: Diagnosis not present

## 2023-07-18 DIAGNOSIS — Z955 Presence of coronary angioplasty implant and graft: Secondary | ICD-10-CM | POA: Diagnosis not present

## 2023-07-18 DIAGNOSIS — R0602 Shortness of breath: Secondary | ICD-10-CM | POA: Insufficient documentation

## 2023-07-18 DIAGNOSIS — Z952 Presence of prosthetic heart valve: Secondary | ICD-10-CM

## 2023-07-18 DIAGNOSIS — Z7901 Long term (current) use of anticoagulants: Secondary | ICD-10-CM | POA: Diagnosis not present

## 2023-07-18 DIAGNOSIS — I252 Old myocardial infarction: Secondary | ICD-10-CM | POA: Insufficient documentation

## 2023-07-18 LAB — BASIC METABOLIC PANEL
Anion gap: 8 (ref 5–15)
BUN: 16 mg/dL (ref 8–23)
CO2: 26 mmol/L (ref 22–32)
Calcium: 9.2 mg/dL (ref 8.9–10.3)
Chloride: 102 mmol/L (ref 98–111)
Creatinine, Ser: 1.06 mg/dL (ref 0.61–1.24)
GFR, Estimated: >60 mL/min (ref >60)
Glucose, Bld: 130 mg/dL — ABNORMAL HIGH (ref 70–99)
Potassium: 4.9 mmol/L (ref 3.5–5.1)
Sodium: 136 mmol/L (ref 135–145)

## 2023-07-18 LAB — PROTIME-INR
INR: 2.9 — ABNORMAL HIGH (ref 0.8–1.2)
Prothrombin Time: 30.6 s — ABNORMAL HIGH (ref 11.4–15.2)

## 2023-07-18 LAB — CBC
HCT: 46.4 % (ref 39.0–52.0)
Hemoglobin: 14.7 g/dL (ref 13.0–17.0)
MCH: 27.6 pg (ref 26.0–34.0)
MCHC: 31.7 g/dL (ref 30.0–36.0)
MCV: 87.1 fL (ref 80.0–100.0)
Platelets: 152 10*3/uL (ref 150–400)
RBC: 5.33 MIL/uL (ref 4.22–5.81)
RDW: 18.1 % — ABNORMAL HIGH (ref 11.5–15.5)
WBC: 7.2 10*3/uL (ref 4.0–10.5)
nRBC: 0 % (ref 0.0–0.2)

## 2023-07-18 LAB — LACTATE DEHYDROGENASE: LDH: 189 U/L (ref 98–192)

## 2023-07-18 NOTE — Patient Instructions (Addendum)
 No medication changes today Coumadin dosing per Leotis Shames [facility]. Return to VAD Clinic in two months for your 2.5 year Intermacs and 6 minute walk. Please call us if you have any questions or concerns prior to your next visit.

## 2023-07-18 NOTE — Progress Notes (Signed)
 Patient presents to VAD Clinic for 2 month f/u alone. Reports no problems with VAD equipment or concerns with drive line.  Patient says he experienced increased shortness of breath and weight gain recently. He took 2 doses of Torsemide 20 mg daily (forgot to take the potassium) and is feeling better. Increased number of PI events today. Dr. Gala Romney reviewed.  Pt reports he has been started on 2 new eye drops (med list updated) and was told by his ophthalmologist he can stop his Inspra. Suggested he discuss with Dr. Gala Romney before doing so.   Vital Signs:    Doppler Pressure: 100 Automatic BP: 101/88 (94) HR: 70 SPO2: 97 % on RA   Weight: 211.4 w/ ept Last weight: 212.6 b w/ eqt Ht: 6'   VAD Interrogation: Speed: 5800 Flow: 4.9 Power: 4.8w    PI: 4.6 Alarms: none Events: > 100 PI events today  Fixed speed: 5800 Low speed limit: 5500  Primary controller: back up battery due for replacement in 23 months Secondary controller:  back up battery due for replacement in 6 months    I reviewed the LVAD parameters from today and compared the results to the patient's prior recorded data. LVAD interrogation was NEGATIVE for significant power changes, NEGATIVE for clinical alarms and STABLE for PI events/speed drops. No programming changes were made and pump is functioning within specified parameters. Pt is performing daily controller and system monitor self tests along with completing weekly and monthly maintenance for LVAD equipment.   LVAD equipment check completed and is in good working order. Back-up equipment  present and charged at today's visit.   Annual Equipment Maintenance on UBC/PM was performed today 03/11/23.    Exit Site Care:  Existing VAD dressing dry and intact. Anchor intact and applied accurately. Pt denies any redness, tenderness, drainage, or pain at exit site. Neighbor continues to perform weekly dressing changes. Provided patient with 8 weekly dressing kits.      Device: N/A  (removed due to infection 12/04/20)  BP & Labs:  Doppler 100 - Doppler is reflecting modified systolic   Hgb 14.2 - No S/S of bleeding. Specifically denies melena/BRBPR or nosebleeds.   LDH pending with established baseline of 135 - 309. Denies tea-colored urine. No power elevations noted on interrogation.   Patient Instructions:  No medication changes today Coumadin dosing per Leotis Shames [facility]. Return to VAD Clinic in two months for your 2.5 year Intermacs and 6 minute walk. Please call us if you have any questions or concerns prior to your next visit.    Hessie Diener RN,BSN VAD Coordinator  Office: [phone]  24/7 Pager: [phone]

## 2023-07-20 NOTE — Progress Notes (Addendum)
 LVAD Follow-up Clinic Note   PCP: Deeann Saint, MD HF MD: DB   HPI:  Brandon [name] is a 68 year old male with history of tobacco use, CAD s/p previous anterior MI, severe systolic HF s/p HM-3 VAD 04/06/21, severe AS s/p AVR, endocarditis.    Admitted 12/20 with anterior ST elevation MI. Cath with chronically occluded RCA (L>>R collaterals) and thrombotic occlusion of proximal LAD treated with PCI. Echo w/ reduced LVEF 30-35% w/ apical aneurysm. RV ok. Post cath, he required milrinone for cardiogenic shock. Subsequently underwent Barostim.   Underwent outpatient [facility] 11/29/20 as part of VAD w/u. Post procedure, developed severe rigors and fever. Admitted to ICU Blood Cx + strep. 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  Likely low-flow low gradient AS. Underwent ICD extraction..    Admitted for cardiogenic shock in 10/22.  He underwent placement of HM3 VAD and aortic valve replacement with 25 mm Edwards Inspiris Resilia valve on 04/06/21. Surgical path came back 11/28 with evidence of possible "acute aortic valve endocarditis". 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 Discharged 05/09/21.   He had a complicated month of December.  On 04/22/2022 he presented with shortness of breath and diaphoresis.  Found to be in ventricular fibrillation.  Was defibrillated.  Then on Christmas day he presented back with chest pain, nausea, and shortness of breath.  ECG showed ST elevation.  Catheterization showed stable CAD. Cardiac CT suspicious for clot on AoV. INR increased to 2.5-3.0 range.   Had PET 1/24 which showed increased size of the spiculated LUL pulmonary nodule which is hypermetabolic concerning for primary bronchogenic carcinoma. Has seen Dr. Dorris Fetch and referred  for stereotactic radiation without biopsy.   Admitted on 02/05/23 with anemia hgb was 10.3. INR 2.0. He was given a bolus of IVFs. Protonix increased to 40 mg bid. GI consulted. On 9/26, underwent EGD showing 1 duodenal AVM which was treated APC. During admission, episode of hypotension requiring additional 500cc bolus IVF. Epleronone decreases to 25mg  daily at discharge. Intermittent runs of VT    Follow up for Heart Failure/LVAD: Overall doing well. Recently had increased SOB when working out. Weight up a few pounds so took torsemide 20mg  for 2 days. Says breathing now much better and back to baseline. .Denies orthopnea or PND. No fevers, chills or problems with driveline. No bleeding, melena or neuro symptoms. No VAD alarms. Taking all meds as prescribed.    VAD Interrogation: Speed: 5800 Flow: 4.9 Power: 4.8 w    PI: 4.6 Alarms: none Events: > 100 PI events today  Fixed speed: 5800 Low speed limit: 5500   Primary controller: back up battery due for replacement in 23 months Secondary controller:  back up battery due for replacement in 6 months    I reviewed the LVAD parameters from today and compared the results to the patient's prior recorded data. LVAD interrogation was NEGATIVE for significant power changes, NEGATIVE for clinical alarms and STABLE for PI events/speed drops. No programming changes were made and pump is functioning within specified parameters. Pt is performing daily controller and system monitor self tests along with completing weekly and monthly maintenance for LVAD equipment.   LVAD equipment check completed and is in good working order. Back-up equipment  present and charged at today's visit.   Annual Equipment Maintenance  on UBC/PM was performed today 03/11/23 Past Medical History:  Diagnosis Date   AICD (automatic cardioverter/defibrillator) present 08/31/2019   Ankylosing spondylitis (HCC)    Arthritis    BENIGN PROSTATIC HYPERTROPHY 06/07/2008   CHF  (congestive heart failure) (HCC)    COLONIC POLYPS, HX OF 06/07/2008   Coronary artery disease    Depression    H/O hiatal hernia    Heart failure (HCC)    History of radiation therapy    Left Lung- 08/06/22-08/16/22- Dr. Antony Blackbird   HYPERLIPIDEMIA 06/07/2008   HYPERTENSION 06/07/2008   Myocardial infarction (HCC) 2005   NSTEMI, s/p LAD stent   NEPHROLITHIASIS, HX OF 06/07/2008   STEMI (ST elevation myocardial infarction) (HCC) 04/27/2019    Current Outpatient Medications  Medication Sig Dispense Refill   acetaminophen (TYLENOL) 500 MG tablet Take 500-1,000 mg by mouth every 6 (six) hours as needed (for pain).     amoxicillin (AMOXIL) 500 MG capsule TAKE 4 CAPSULES (2,000 MG TOTAL) BY MOUTH AS NEEDED. 30-60 MINUTES BEFORE DENTAL PROCEDURE 4 capsule 3   atorvastatin (LIPITOR) 80 MG tablet PATIENT MUST ATTEND ANNUAL APPOINTMENT FOR FUTURE REFILLS TAKE 1 TABLET BY MOUTH ONCE DAILY AT 6PM. 90 tablet 1   Carboxymethylcellulose Sodium (ARTIFICIAL TEARS OP) Place 1 drop into both eyes daily as needed (for dryness or irritation).     diphenhydrAMINE (BENADRYL) 25 mg capsule Take 25 mg by mouth every 8 (eight) hours as needed.     dorzolamide (TRUSOPT) 2 % ophthalmic solution Place 1 drop into the left eye 3 (three) times daily.     eplerenone (INSPRA) 25 MG tablet Take 2 tablets (50 mg total) by mouth in the morning. 30 tablet 5   ezetimibe (ZETIA) 10 MG tablet TAKE 1 TABLET BY MOUTH DAILY 90 tablet 3   gabapentin (NEURONTIN) 300 MG capsule Take 1 capsule (300 mg total) by mouth 3 (three) times daily. 90 capsule 3   ketorolac (ACULAR) 0.5 % ophthalmic solution Place 1 drop into the left eye 4 (four) times daily.     metoprolol succinate (TOPROL-XL) 25 MG 24 hr tablet Take 1 tablet (25 mg total) by mouth daily. 90 tablet 3   pantoprazole (PROTONIX) 40 MG tablet TAKE 1 TABLET BY MOUTH TWICE A DAY 180 tablet 2   sertraline (ZOLOFT) 50 MG tablet Take 1 tablet (50 mg total) by mouth daily. Pt  taking 50 mg daily 90 tablet 3   tamsulosin (FLOMAX) 0.4 MG CAPS capsule TAKE 1 CAPSULE BY MOUTH EVERY DAY 90 capsule 1   torsemide (DEMADEX) 20 MG tablet Take 1 tablet (20 mg total) by mouth as needed. 30 tablet 5   traMADol (ULTRAM) 50 MG tablet TAKE 1-2 TABLETS BY MOUTH TWICE A DAY FOR 30 DAYS 60 tablet 0   warfarin (COUMADIN) 2.5 MG tablet TAKE 7.5 MG (3 TABLETS) EVERY MONDAY/FRIDAY AND 5 MG (2 TABLETS) ALL OTHER DAYS OR AS INSTRUCTED BY LVAD CLINIC. (Patient taking differently: Take two of the 2.5 mg tablets for total of 5mg  on Mondays and all other days take 3 of the 2.5 mg tablets for total of 7.5mg  per patient) 180 tablet 11   ZYRTEC ALLERGY 10 MG tablet Take 10 mg by mouth daily.     nystatin (MYCOSTATIN/NYSTOP) powder Apply 1 Application topically 3 (three) times daily. (Patient not taking: Reported on 05/19/2023) 15 g 2   potassium chloride SA (KLOR-CON M20) 20 MEQ tablet Take 1 tablet as needed when taking Torsemide (Patient not taking: Reported on 05/19/2023)  No current facility-administered medications for this encounter.    Patient has no known allergies.  Vital Signs:     Doppler Pressure: 100 Automatic BP: 101/88 (94) HR: 70 SPO2: 97 % on RA   Weight: 211.4 w/ ept Last weight: 212.6 b w/ eqt Ht: 6'    Physical Exam: General:  NAD.  HEENT: normal  Neck: supple. JVP not elevated.  Carotids 2+ bilat; no bruits. No lymphadenopathy or thryomegaly appreciated. Cor: LVAD hum.  Lungs: Clear. Abdomen: soft, nontender, non-distended. No hepatosplenomegaly. No bruits or masses. Good bowel sounds. Driveline site clean. Anchor in place.  Extremities: no cyanosis, clubbing, rash. Warm no edema  Neuro: alert & oriented x 3. No focal deficits. Moves all 4 without problem     ASSESSMENT AND PLAN:  1. Chronic systolic HFr EF due to iCM - Echo 05/19/20 EF 20-25% RV mildly HK. moderate AS  Mean gradient 13 AVA 1.2 cm2 DI 0.30 - s/p HM-III VAD + bioprosthetic AVR 04/06/21 -  Episode of cardiogenic shock and anterior ST elevation 12/23. Cath with patent LAD stent. Cardiac CT suggestive of clot on aortic valve. Felt to be possible embolic event  - Doing well.Seems to have had some volume overload which is now improved after 2 doses of torsemide. But now looks like he is dry with very frequent PI events - Have told him to use torsemide sparingly as needed for weight gain and DOE - Continue Toprol 12.5 daily - Continue eplerenone 50 mg daily   2. HM-3 LVAD implant - VAD interrogated personally. Parameters stable. - Ramp echo 12/30/22: LVEF < 20% LVIDd 6.1cm. Cannula well placed. RV mildly HK. AoV not opening. No AI. Septum midline at 5800. No change in speed - ASA resumed due to previous thrombotic episode off AoV - INR 2.9 Goal 2.5-3.0 due to clotting on AoV and possible coronary embolism 12/23 Discussed warfarin dosing with PharmD personally. - LDH 189 - Hgb 12.4 -> 10.5 -> 13.5 -> 14.7. (improved with IV iron) - MAPs ok  - DL looks good  3. VT/VF 12/23 - remains in NSR.  - No recent recurrence   4. Acute upper GI bleed 9/24 with iron-deficiency anemia - 02/06/23 underwent EGD showing 1 duodenal AVM which was treated APC - Hgb improved wit IV iron in 12/24. See above  5. Severe low-flow aortic stenosis s/p AVR - s/p VAD/bioprosthetic AVR - No change   6. CAD  - s/p anterior STEMI (12/20). LHC showed chronically occluded RCA (with L>>R collaterals) and thrombotic occlusion of proximal LAD. Underwent PCI of LAD. - Now s/p VAD - Recurrent anterior STEMI in 12/23 Cath 12/23 LAD stent patent. Felt to be embolic from aortic valve - Continue atorvastatin 80.  - Continue ASA 81 with previous thrombotic episode off AoV - No s/s angina  Continue exercise program    7. Paroxysmal Atrial Fibrillation w/ RVR - Remains in NSR - Off amio. On warfarin   8. LUL Bronchogenic CA - h/o tobacco use - Has seen Dr. Dorris Fetch.  - s/p XRT 5/24 - continue to follow with  oncology  9. Ascending aortic aneurysm - 4.1 cm on CT 8/24. Will follow - provided reassurance that this is just minimally dilated.  - no change today  I spent a total of 41 minutes today: 1) reviewing the patient's medical records including previous charts, labs and recent notes from other providers; 2) examining the patient and counseling them on their medical issues/explaining the plan of care; 3) adjusting  meds as needed and 4) ordering lab work or other needed tests.    Arvilla Meres, MD  3:08 PM

## 2023-07-24 ENCOUNTER — Ambulatory Visit (HOSPITAL_COMMUNITY): Payer: Self-pay | Admitting: Pharmacist

## 2023-07-24 LAB — POCT INR: INR: 2.8 (ref 2.0–3.0)

## 2023-07-29 ENCOUNTER — Telehealth (HOSPITAL_COMMUNITY): Payer: Self-pay | Admitting: Licensed Clinical Social Worker

## 2023-07-29 NOTE — Telephone Encounter (Signed)
 CSW contacted patient to share about the upcoming LVAD [facility] on Thursday March 27 at 1:30 pm in the Heart and Vascular Conference Room. Patient expressed interest. Lasandra Beech, LCSW, CCSW-MCS [phone]

## 2023-07-31 ENCOUNTER — Ambulatory Visit (HOSPITAL_COMMUNITY): Payer: Self-pay | Admitting: Pharmacist

## 2023-07-31 LAB — POCT INR: INR: 2.8 (ref 2.0–3.0)

## 2023-07-31 NOTE — Progress Notes (Unsigned)
 Chief Complaint:recall colon Primary GI Doctor:Dr. Adela Lank  HPI:  Patient is a  68  year old male patient with past medical history of tobacco use, CAD s/p previous anterior MI, severe systolic HF s/p HM-3 VAD 04/06/21, severe AS s/p AVR, endocarditis. , BPH, hyperlipidemia, who presents to discuss recall colonoscopy.  Acute upper GI bleed 9/24 with iron-deficiency anemia - 02/06/23 underwent EGD with Dr. [name] showing 1 duodenal AVM which was treated APC - Hgb improved with IV iron in 12/24.  On 07/18/2023 patient last seen by cardiology for follow-up on HF/LVAD.Overall doing well. Recently had increased SOB when working out. Weight up a few pounds so took torsemide 20mg  for 2 days. Says breathing now much better and back to baseline.    Interval History   Patient presents to office to discuss colon screening colonoscopy for history of colonic polyps. Patient denies altered bowel habits, abdominal pain, or rectal bleeding. Patient has history of GERD and currently on Pantoprazole 40 mg po daily.Patient denies dysphagia. Patient denies nausea, vomiting, or weight loss. Patient on Coumadin 7.5 mg po daily prescribed by Dr. Gala Romney.  Overall patient states he is the healthiest he has been in a long time and has been going to the gym several days a week.  Wt Readings from Last 3 Encounters:  08/01/23 213 lb (96.6 kg)  07/18/23 211 lb 7.5 oz (95.9 kg)  07/10/23 211 lb 6.4 oz (95.9 kg)    Past Medical History:  Diagnosis Date   AICD (automatic cardioverter/defibrillator) present 08/31/2019   Ankylosing spondylitis (HCC)    Arthritis    BENIGN PROSTATIC HYPERTROPHY 06/07/2008   CHF (congestive heart failure) (HCC)    COLONIC POLYPS, HX OF 06/07/2008   Coronary artery disease    Depression    H/O hiatal hernia    Heart failure (HCC)    History of radiation therapy    Left Lung- 08/06/22-08/16/22- Dr. Antony Blackbird   HYPERLIPIDEMIA 06/07/2008   HYPERTENSION 06/07/2008   Myocardial  infarction [facility]) 2005   NSTEMI, s/p LAD stent   NEPHROLITHIASIS, HX OF 06/07/2008   STEMI (ST elevation myocardial infarction) (HCC) 04/27/2019    Past Surgical History:  Procedure Laterality Date   AORTIC VALVE REPLACEMENT N/A 04/06/2021   Procedure: AORTIC VALVE REPLACEMENT (AVR) WITH INSPIRIS RESILIA  AORTIC VALVE SIZE ;  Surgeon: Alleen Borne, MD;  Location: [facility] OR;  Service: Open Heart Surgery;  Laterality: N/A;   COLONOSCOPY WITH PROPOFOL N/A 04/24/2020   Procedure: COLONOSCOPY WITH PROPOFOL;  Surgeon: Benancio Deeds, MD;  Location: [facility] ENDOSCOPY;  Service: Gastroenterology;  Laterality: N/A;   CORONARY ANGIOPLASTY WITH STENT PLACEMENT  2005   LAD stent, jailed diagonal   CORONARY/GRAFT ACUTE MI REVASCULARIZATION N/A 04/27/2019   Procedure: Coronary/Graft Acute MI Revascularization;  Surgeon: Corky Crafts, MD;  Location[facility] INVASIVE CV LAB;  Service: Cardiovascular;  Laterality: N/A;   ESOPHAGOGASTRODUODENOSCOPY (EGD) WITH PROPOFOL N/A 02/06/2023   Procedure: ESOPHAGOGASTRODUODENOSCOPY (EGD) WITH PROPOFOL;  Surgeon: Imogene Burn, MD;  Location: [facility] ENDOSCOPY;  Service: Gastroenterology;  Laterality: N/A;   HOT HEMOSTASIS N/A 02/06/2023   Procedure: HOT HEMOSTASIS (ARGON PLASMA COAGULATION/BICAP);  Surgeon: Imogene Burn, MD;  Location: [facility] ENDOSCOPY;  Service: Gastroenterology;  Laterality: N/A;   ICD IMPLANT  08/31/2019   ICD IMPLANT N/A 08/31/2019   Procedure: ICD IMPLANT;  Surgeon: Hillis Range, MD;  Location: [facility] INVASIVE CV LAB;  Service: Cardiovascular;  Laterality: N/A;   ICD LEAD REMOVAL N/A 12/04/2020   Procedure: ICD  SYSTEM EXTRACTION;  Surgeon: Lanier Prude, MD;  Location: [facility] OR;  Service: Cardiovascular;  Laterality: N/A;   INSERTION OF IMPLANTABLE LEFT VENTRICULAR ASSIST DEVICE N/A 04/06/2021   Procedure: INSERTION OF IMPLANTABLE LEFT VENTRICULAR ASSIST DEVICE;  Surgeon: Alleen Borne, MD;  Location: [facility] OR;  Service: Open Heart Surgery;   Laterality: N/A;  HM3   IR THORACENTESIS ASP PLEURAL SPACE W/IMG GUIDE  04/19/2021   IR THORACENTESIS ASP PLEURAL SPACE W/IMG GUIDE  04/20/2021   IR THORACENTESIS ASP PLEURAL SPACE W/IMG GUIDE  04/23/2021   LAMINECTOMY     lumbar   LEFT HEART CATH AND CORONARY ANGIOGRAPHY N/A 04/27/2019   Procedure: LEFT HEART CATH AND CORONARY ANGIOGRAPHY;  Surgeon: Corky Crafts, MD;  Location: [facility] INVASIVE CV LAB;  Service: Cardiovascular;  Laterality: N/A;   LUMBAR FUSION  01/2012   POLYPECTOMY  04/24/2020   Procedure: POLYPECTOMY;  Surgeon: Benancio Deeds, MD;  Location: [facility] ENDOSCOPY;  Service: Gastroenterology;;   RIGHT HEART CATH N/A 04/27/2019   Procedure: RIGHT HEART CATH;  Surgeon: Swazil[place], Irie M, MD;  Location: [facility] INVASIVE CV LAB;  Service: Cardiovascular;  Laterality: N/A;   RIGHT HEART CATH N/A 03/28/2021   Procedure: RIGHT HEART CATH;  Surgeon: Dolores Patty, MD;  Location: [facility] INVASIVE CV LAB;  Service: Cardiovascular;  Laterality: N/A;   RIGHT HEART CATH AND CORONARY ANGIOGRAPHY N/A 05/06/2022   Procedure: RIGHT HEART CATH AND CORONARY ANGIOGRAPHY;  Surgeon: Dolores Patty, MD;  Location: [facility] INVASIVE CV LAB;  Service: Cardiovascular;  Laterality: N/A;   RIGHT/LEFT HEART CATH AND CORONARY ANGIOGRAPHY N/A 11/29/2020   Procedure: RIGHT/LEFT HEART CATH AND CORONARY ANGIOGRAPHY;  Surgeon: Dolores Patty, MD;  Location: [facility] INVASIVE CV LAB;  Service: Cardiovascular;  Laterality: N/A;   TEE WITHOUT CARDIOVERSION N/A 12/04/2020   Procedure: TRANSESOPHAGEAL ECHOCARDIOGRAM (TEE);  Surgeon: Lanier Prude, MD;  Location: [facility] OR;  Service: Cardiovascular;  Laterality: N/A;   TEE WITHOUT CARDIOVERSION N/A 04/06/2021   Procedure: TRANSESOPHAGEAL ECHOCARDIOGRAM (TEE);  Surgeon: Alleen Borne, MD;  Location: [facility] OR;  Service: Open Heart Surgery;  Laterality: N/A;   TONSILLECTOMY     warthin tumor removal Left 2014    Current Outpatient Medications  Medication Sig Dispense Refill    acetaminophen (TYLENOL) 500 MG tablet Take 500-1,000 mg by mouth every 6 (six) hours as needed (for pain).     amoxicillin (AMOXIL) 500 MG capsule TAKE 4 CAPSULES (2,000 MG TOTAL) BY MOUTH AS NEEDED. 30-60 MINUTES BEFORE DENTAL PROCEDURE 4 capsule 3   atorvastatin (LIPITOR) 80 MG tablet PATIENT MUST ATTEND ANNUAL APPOINTMENT FOR FUTURE REFILLS TAKE 1 TABLET BY MOUTH ONCE DAILY AT 6PM. 90 tablet 1   Carboxymethylcellulose Sodium (ARTIFICIAL TEARS OP) Place 1 drop into both eyes daily as needed (for dryness or irritation).     diphenhydrAMINE (BENADRYL) 25 mg capsule Take 25 mg by mouth every 8 (eight) hours as needed.     dorzolamide (TRUSOPT) 2 % ophthalmic solution Place 1 drop into the left eye 3 (three) times daily.     eplerenone (INSPRA) 25 MG tablet Take 2 tablets (50 mg total) by mouth in the morning. 30 tablet 5   ezetimibe (ZETIA) 10 MG tablet TAKE 1 TABLET BY MOUTH DAILY 90 tablet 3   gabapentin (NEURONTIN) 300 MG capsule Take 1 capsule (300 mg total) by mouth 3 (three) times daily. 90 capsule 3   ketorolac (ACULAR) 0.5 % ophthalmic solution Place 1 drop into the left eye 4 (four) times  daily.     metoprolol succinate (TOPROL-XL) 25 MG 24 hr tablet Take 1 tablet (25 mg total) by mouth daily. 90 tablet 3   nystatin (MYCOSTATIN/NYSTOP) powder Apply 1 Application topically 3 (three) times daily. 15 g 2   pantoprazole (PROTONIX) 40 MG tablet TAKE 1 TABLET BY MOUTH TWICE A DAY 180 tablet 2   potassium chloride SA (KLOR-CON M20) 20 MEQ tablet Take 1 tablet as needed when taking Torsemide     sertraline (ZOLOFT) 50 MG tablet Take 1 tablet (50 mg total) by mouth daily. Pt taking 50 mg daily 90 tablet 3   tamsulosin (FLOMAX) 0.4 MG CAPS capsule TAKE 1 CAPSULE BY MOUTH EVERY DAY 90 capsule 1   torsemide (DEMADEX) 20 MG tablet Take 1 tablet (20 mg total) by mouth as needed. 30 tablet 5   traMADol (ULTRAM) 50 MG tablet TAKE 1-2 TABLETS BY MOUTH TWICE A DAY FOR 30 DAYS 60 tablet 0   warfarin  (COUMADIN) 2.5 MG tablet TAKE 7.5 MG (3 TABLETS) EVERY MONDAY/FRIDAY AND 5 MG (2 TABLETS) ALL OTHER DAYS OR AS INSTRUCTED BY LVAD CLINIC. (Patient taking differently: Take two of the 2.5 mg tablets for total of 5mg  on Mondays and all other days take 3 of the 2.5 mg tablets for total of 7.5mg  per patient) 180 tablet 11   ZYRTEC ALLERGY 10 MG tablet Take 10 mg by mouth daily.     No current facility-administered medications for this visit.    Allergies as of 08/01/2023   (No Known Allergies)    Family History  Problem Relation Age of Onset   Depression Father    Arthritis Sister        RA   Heart failure Mother    Other Neg Hx    Colon cancer Neg Hx    Esophageal cancer Neg Hx    Rectal cancer Neg Hx    Stomach cancer Neg Hx    Colon polyps Neg Hx     Review of Systems:    Constitutional: No weight loss, fever, chills, weakness or fatigue HEENT: Eyes: No change in vision               Ears, Nose, Throat:  No change in hearing or congestion Skin: No rash or itching Cardiovascular: No chest pain, chest pressure or palpitations   Respiratory: No SOB or cough Gastrointestinal: See HPI and otherwise negative Genitourinary: No dysuria or change in urinary frequency Neurological: No headache, dizziness or syncope Musculoskeletal: No new muscle or joint pain Hematologic: No bleeding or bruising Psychiatric: No history of depression or anxiety    Physical Exam:  Vital signs: BP 112/62   Pulse 72   Ht 6' (1.829 m)   Wt 213 lb (96.6 kg)   BMI 28.89 kg/m   Constitutional:   Pleasant male appears to be in NAD, Well developed, Well nourished, alert and cooperative Throat: Oral cavity and pharynx without inflammation, swelling or lesion.  Respiratory: Respirations even and unlabored. Lungs clear to auscultation bilaterally.   No wheezes, crackles, or rhonchi.  Cardiovascular: LVAD. No peripheral edema, cyanosis or pallor.  Gastrointestinal:  Soft, nondistended, nontender. No  rebound or guarding. Normal bowel sounds. No appreciable masses or hepatomegaly. Rectal:  Not performed.  Msk:  Symmetrical without gross deformities. Without edema, no deformity or joint abnormality.  Neurologic:  Alert and  oriented x4;  grossly normal neurologically.  Skin:   Dry and intact without significant lesions or rashes. Psychiatric: Oriented to person, place and  time. Demonstrates good judgement and reason without abnormal affect or behaviors.  RELEVANT LABS AND IMAGING: CBC    Latest Ref Rng & Units 07/18/2023    9:57 AM 05/19/2023   12:49 PM 03/11/2023   10:08 AM  CBC  WBC 4.0 - 10.5 K/uL 7.2  8.5  7.2   Hemoglobin 13.0 - 17.0 g/dL 74.2  59.5  63.8   Hematocrit 39.0 - 52.0 % 46.4  42.7  35.7   Platelets 150 - 400 K/uL 152  169  217      CMP     Latest Ref Rng & Units 07/18/2023    9:57 AM 05/19/2023   12:49 PM 03/11/2023   10:08 AM  CMP  Glucose 70 - 99 mg/dL 756  433  295   BUN 8 - 23 mg/dL 16  14  14    Creatinine 0.61 - 1.24 mg/dL 1.88  4.16  6.06   Sodium 135 - 145 mmol/L 136  135  137   Potassium 3.5 - 5.1 mmol/L 4.9  4.4  4.7   Chloride 98 - 111 mmol/L 102  100  102   CO2 22 - 32 mmol/L 26  26  26    Calcium 8.9 - 10.3 mg/dL 9.2  9.4  9.3   Total Protein 6.5 - 8.1 g/dL   7.5   Total Bilirubin 0.3 - 1.2 mg/dL   0.7   Alkaline Phos 38 - 126 U/L   113   AST 15 - 41 U/L   22   ALT 0 - 44 U/L   18      Lab Results  Component Value Date   TSH 0.917 02/06/2021  02/10/23 echo- Left ventricular ejection fraction, by estimation, is <20%.  02/06/23 EGD with Dr. [name] Impression:  - Normal esophagus.  - Gastritis.  - A single non- bleeding angioectasia in the duodenum. Treated with argon plasma coagulation ( APC) .  - No specimens collected. 04/24/2020 colonoscopy with Dr. Adela Lank, 3 year recall Impression:  - One 8 mm polyp in the cecum, removed with a cold snare. Resected and retrieved.  - Two 3 to 6 mm polyps in the ascending colon, removed with a cold  snare. Resected and retrieved.  - One 15 mm polyp in the transverse colon, removed with a cold snare. Resected and retrieved.  - Diverticulosis in the sigmoid colon.  - Internal hemorrhoids.  - The examination was otherwise normal. 01/05/2014 colonoscopy with Dr. Arlyce Dice Endoscopic impression Sessile polyp measuring 13 mm in size was found in the ascending colon polypectomy was performed using snare cautery, the wound at the site was closed by placing hemoclips Sessile polyp measuring 3 mm in size was found in the sigmoid colon polypectomy was performed with cold snare Mild diverticulosis was noted in sigmoid colon Internal hemorrhoids  Assessment: Encounter Diagnoses  Name Primary?   Long term (current) use of anticoagulants Yes   Gastroesophageal reflux disease, unspecified whether esophagitis present    History of colonic polyps      68 year old male patient who presents to schedule colonoscopy for history colonic polyps with Dr. Adela Lank at hospital due to cardiac history. Will need cardiac clearance for Coumadin. No other GI issues at this time.  Plan: -Schedule for a colonoscopy at hospital with Dr. Adela Lank. The risks and benefits of colonoscopy with possible polypectomy / biopsies were discussed and the patient agrees to proceed. At hospital with EF % <20% and LVAD. Cardiac clearance from Dr. Gala Romney for Coumadin. -  Hold Coumadin  5 days before procedure - will instruct when and how to resume after procedure. Risks and benefits of procedure including bleeding, perforation, infection, missed lesions, medication reactions and possible hospitalization or surgery if complications occur explained. Additional rare but real risk of cardiovascular event such as heart attack or ischemia/infarct of other organs off Coumadin explained and need to seek urgent help if this occurs. Will communicate by phone or EMR with patient's prescribing provider that to confirm holding Coumadin is reasonable  in this case.    Thank you for the courtesy of this consult. Please call me with any questions or concerns.   Makenlee Mckeag, FNP-C  Gastroenterology 08/01/2023, 10:48 AM  Cc: Deeann Saint, MD

## 2023-08-01 ENCOUNTER — Encounter: Payer: Self-pay | Admitting: Gastroenterology

## 2023-08-01 ENCOUNTER — Ambulatory Visit (INDEPENDENT_AMBULATORY_CARE_PROVIDER_SITE_OTHER): Payer: Medicare Other | Admitting: Gastroenterology

## 2023-08-01 VITALS — BP 112/62 | HR 72 | Ht 72.0 in | Wt 213.0 lb

## 2023-08-01 DIAGNOSIS — K219 Gastro-esophageal reflux disease without esophagitis: Secondary | ICD-10-CM

## 2023-08-01 DIAGNOSIS — Z7901 Long term (current) use of anticoagulants: Secondary | ICD-10-CM | POA: Diagnosis not present

## 2023-08-01 DIAGNOSIS — Z860100 Personal history of colon polyps, unspecified: Secondary | ICD-10-CM | POA: Diagnosis not present

## 2023-08-01 MED ORDER — SUFLAVE 178.7 G PO SOLR: 1.0000 | Freq: Once | ORAL | 0 refills | Status: AC

## 2023-08-01 NOTE — Patient Instructions (Signed)
 You have been scheduled for a colonoscopy. Please follow written instructions given to you at your visit today.   If you use inhalers (even only as needed), please bring them with you on the day of your procedure.  DO NOT TAKE 7 DAYS PRIOR TO TEST- Trulicity (dulaglutide) Ozempic, Wegovy (semaglutide) Mounjaro (tirzepatide) Bydureon Bcise (exanatide extended release)  DO NOT TAKE 1 DAY PRIOR TO YOUR TEST Rybelsus (semaglutide) Adlyxin (lixisenatide) Victoza (liraglutide) Byetta (exanatide) ___________________________________________________________________________  Bonita Quin will receive your bowel preparation through [facility], which ensures the lowest copay and home delivery, with outreach via text or call from an [phone] number. Please respond promptly to avoid rescheduling of your procedure. If you are interested in alternative options or have any questions regarding your prep, please contact them at [phone] ____________________________________________________________________________  Your Provider Has Sent Your Bowel Prep Regimen To [facility]   [facility] will contact you to verify your information and collect your copay, if applicable. Enjoy the comfort of your home while your prescription is mailed to you, FREE of any shipping charges.   [facility] accepts all major insurance benefits and applies discounts & coupons.  Have additional questions?   Chat: [email or web address] Call: [phone] Email: [email or web address] NCPDP: [id number]  How will [facility] contact you?  With a Welcome phone call,  a Welcome text and a checkout link in text form.  Texts you receive from [phone] Are NOT Spam.  *To set up delivery, you must complete the checkout process via link or speak to one of the patient care representatives. If [facility] is unable to reach you, your prescription may be delayed.  To avoid long hold times on the phone, you may also utilize the secure chat  feature on the [facility] website to request that they call you back for transaction completion or to expedite your concerns. Due to recent changes in healthcare laws, you may see the results of your imaging and laboratory studies on MyChart before your provider has had a chance to review them.  We understand that in some cases there may be results that are confusing or concerning to you. Not all laboratory results come back in the same time frame and the provider may be waiting for multiple results in order to interpret others.  Please give [place] 48 hours in order for your provider to thoroughly review all the results before contacting the office for clarification of your results.  _______________________________________________________  If your blood pressure at your visit was 140/90 or greater, please contact your primary care physician to follow up on this.  _______________________________________________________  If you are age 23 or older, your body mass index should be between 23-30. Your Body mass index is 28.89 kg/m. If this is out of the aforementioned range listed, please consider follow up with your Primary Care Provider.  If you are age 32 or younger, your body mass index should be between 19-25. Your Body mass index is 28.89 kg/m. If this is out of the aformentioned range listed, please consider follow up with your Primary Care Provider.   ________________________________________________________  The [facility] providers would like to encourage you to use [facility] to communicate with providers for non-urgent requests or questions.  Due to long hold times on the telephone, sending your provider a message by [facility] may be a faster and more efficient way to get a response.  Please allow 48 business hours for a response.  Please remember that this is for non-urgent requests.  _______________________________________________________ Thank you for trusting me with your  gastrointestinal care!    Margarite Gouge May, NP

## 2023-08-04 ENCOUNTER — Telehealth: Payer: Self-pay | Admitting: Gastroenterology

## 2023-08-04 ENCOUNTER — Encounter (INDEPENDENT_AMBULATORY_CARE_PROVIDER_SITE_OTHER): Payer: Medicare Other | Admitting: Ophthalmology

## 2023-08-04 DIAGNOSIS — I10 Essential (primary) hypertension: Secondary | ICD-10-CM | POA: Diagnosis not present

## 2023-08-04 DIAGNOSIS — H353132 Nonexudative age-related macular degeneration, bilateral, intermediate dry stage: Secondary | ICD-10-CM | POA: Diagnosis not present

## 2023-08-04 DIAGNOSIS — H35033 Hypertensive retinopathy, bilateral: Secondary | ICD-10-CM

## 2023-08-04 DIAGNOSIS — H35712 Central serous chorioretinopathy, left eye: Secondary | ICD-10-CM

## 2023-08-04 DIAGNOSIS — H43813 Vitreous degeneration, bilateral: Secondary | ICD-10-CM

## 2023-08-04 NOTE — Telephone Encounter (Signed)
 Inbound call from patient in regards to MyChart message. Please advise.   Thank you

## 2023-08-04 NOTE — Progress Notes (Signed)
 Chart reviewed.  Deanna not sure we should be proceeding with elective surveillance colonoscopy on this patient with an LVAD.  I do not think he will have approval to hold Coumadin with LVAD in place.  His EF is very low.  I would rather see him back in the office in a few months for reassessment to discuss this further with him.  If he has persistent anemia despite iron then we could consider it, however for elective surveillance of polyps I think risks may outweigh benefits.  I would hold off on schedule him for colonoscopy and have him come back to see me in the office in a few months for reassessment so I can discuss further with him.  Thank you

## 2023-08-07 ENCOUNTER — Ambulatory Visit (HOSPITAL_COMMUNITY): Payer: Self-pay | Admitting: Pharmacist

## 2023-08-07 LAB — POCT INR: INR: 3.1 — AB (ref 2.0–3.0)

## 2023-08-08 ENCOUNTER — Ambulatory Visit: Payer: Medicare Other

## 2023-08-08 ENCOUNTER — Other Ambulatory Visit (HOSPITAL_COMMUNITY): Payer: Self-pay | Admitting: Internal Medicine

## 2023-08-08 VITALS — BP 120/62 | HR 60 | Temp 98.3°F | Ht 73.0 in | Wt 213.0 lb

## 2023-08-08 DIAGNOSIS — Z0000 Encounter for general adult medical examination without abnormal findings: Secondary | ICD-10-CM

## 2023-08-08 DIAGNOSIS — Z95811 Presence of heart assist device: Secondary | ICD-10-CM

## 2023-08-08 NOTE — Patient Instructions (Addendum)
 [name] , Thank you for taking time to come for your Medicare Wellness Visit. I appreciate your ongoing commitment to your health goals. Please review the following plan we discussed and let me know if I can assist you in the future.   Referrals/Orders/Follow-Ups/Clinician Recommendations:   This is a list of the screening recommended for you and due dates:  Health Maintenance  Topic Date Due   DTaP/Tdap/Td vaccine (2 - Td or Tdap) 09/11/2021   Flu Shot  12/12/2022   Colon Cancer Screening  04/25/2023   COVID-19 Vaccine (7 - Pfizer risk 2024-25 season) 01/08/2024   Medicare Annual Wellness Visit  08/07/2024   Pneumonia Vaccine  Completed   Hepatitis C Screening  Completed   Zoster (Shingles) Vaccine  Completed   HPV Vaccine  Aged Out   Screening for Lung Cancer  Discontinued   Opioid Pain Medicine Management Opioids are powerful medicines that are used to treat moderate to severe pain. When used for short periods of time, they can help you to: Sleep better. Do better in physical or occupational therapy. Feel better in the first few days after an injury. Recover from surgery. Opioids should be taken with the supervision of a trained health care provider. They should be taken for the shortest period of time possible. This is because opioids can be addictive, and the longer you take opioids, the greater your risk of addiction. This addiction can also be called opioid use disorder. What are the risks? Using opioid pain medicines for longer than 3 days increases your risk of side effects. Side effects include: Constipation. Nausea and vomiting. Breathing difficulties (respiratory depression). Drowsiness. Confusion. Opioid use disorder. Itching. Taking opioid pain medicine for a long period of time can affect your ability to do daily tasks. It also puts you at risk for: Motor vehicle crashes. Depression. Suicide. Heart attack. Overdose, which can be life-threatening. What is a  pain treatment plan? A pain treatment plan is an agreement between you and your health care provider. Pain is unique to each person, and treatments vary depending on your condition. To manage your pain, you and your health care provider need to work together. To help you do this: Discuss the goals of your treatment, including how much pain you might expect to have and how you will manage the pain. Review the risks and benefits of taking opioid medicines. Remember that a good treatment plan uses more than one approach and minimizes the chance of side effects. Be honest about the amount of medicines you take and about any drug or alcohol use. Get pain medicine prescriptions from only one health care provider. Pain can be managed with many types of alternative treatments. Ask your health care provider to refer you to one or more specialists who can help you manage pain through: Physical or occupational therapy. Counseling (cognitive behavioral therapy). Good nutrition. Biofeedback. Massage. Meditation. Non-opioid medicine. Following a gentle exercise program. How to use opioid pain medicine Taking medicine Take your pain medicine exactly as told by your health care provider. Take it only when you need it. If your pain gets less severe, you may take less than your prescribed dose if your health care provider approves. If you are not having pain, do nottake pain medicine unless your health care provider tells you to take it. If your pain is severe, do nottry to treat it yourself by taking more pills than instructed on your prescription. Contact your health care provider for help. Write down the times  when you take your pain medicine. It is easy to become confused while on pain medicine. Writing the time can help you avoid overdose. Take other over-the-counter or prescription medicines only as told by your health care provider. Keeping yourself and others safe  While you are taking opioid pain  medicine: Do not drive, use machinery, or power tools. Do not sign legal documents. Do not drink alcohol. Do not take sleeping pills. Do not supervise children by yourself. Do not do activities that require climbing or being in high places. Do not go to a lake, river, ocean, spa, or swimming pool. Do not share your pain medicine with anyone. Keep pain medicine in a locked cabinet or in a secure area where pets and children cannot reach it. Stopping your use of opioids If you have been taking opioid medicine for more than a few weeks, you may need to slowly decrease (taper) how much you take until you stop completely. Tapering your use of opioids can decrease your risk of symptoms of withdrawal, such as: Pain and cramping in the abdomen. Nausea. Sweating. Sleepiness. Restlessness. Uncontrollable shaking (tremors). Cravings for the medicine. Do not attempt to taper your use of opioids on your own. Talk with your health care provider about how to do this. Your health care provider may prescribe a step-down schedule based on how much medicine you are taking and how long you have been taking it. Getting rid of leftover pills Do not save any leftover pills. Get rid of leftover pills safely by: Taking the medicine to a prescription take-back program. This is usually offered by the county or law enforcement. Bringing them to a pharmacy that has a drug disposal container. Flushing them down the toilet. Check the label or package insert of your medicine to see whether this is safe to do. Throwing them out in the trash. Check the label or package insert of your medicine to see whether this is safe to do. If it is safe to throw it out, remove the medicine from the original container, put it into a sealable bag or container, and mix it with used coffee grounds, food scraps, dirt, or cat litter before putting it in the trash. Follow these instructions at home: Activity Do exercises as told by your  health care provider. Avoid activities that make your pain worse. Return to your normal activities as told by your health care provider. Ask your health care provider what activities are safe for you. General instructions You may need to take these actions to prevent or treat constipation: Drink enough fluid to keep your urine pale yellow. Take over-the-counter or prescription medicines. Eat foods that are high in fiber, such as beans, whole grains, and fresh fruits and vegetables. Limit foods that are high in fat and processed sugars, such as fried or sweet foods. Keep all follow-up visits. This is important. Where to find support If you have been taking opioids for a long time, you may benefit from receiving support for quitting from a local support group or counselor. Ask your health care provider for a referral to these resources in your area. Where to find more information Centers for Disease Control and Prevention (CDC): FootballExhibition.com.br [place] Food and Drug Administration (FDA): PumpkinSearch.com.ee Get help right away if: You may have taken too much of an opioid (overdosed). Common symptoms of an overdose: Your breathing is slower or more shallow than normal. You have a very slow heartbeat (pulse). You have slurred speech. You have nausea and vomiting.  Your pupils become very small. You have other potential symptoms: You are very confused. You faint or feel like you will faint. You have cold, clammy skin. You have blue lips or fingernails. You have thoughts of harming yourself or harming others. These symptoms may represent a serious problem that is an emergency. Do not wait to see if the symptoms will go away. Get medical help right away. Call your local emergency services (911 in the [place] Do not drive yourself to the hospital.  If you ever feel like you may hurt yourself or others, or have thoughts about taking your own life, get help right away. Go to your nearest emergency department  or: Call your local emergency services (911 in the [place] Call the [facility] (206-049-3693 in the [place] Call a suicide crisis helpline, such as the National Suicide Prevention Lifeline at 704-073-5050 or 988 in the [place] This is open 24 hours a day in the [place] If you're a Veteran: Call 988 and press 1. This is open 24 hours a day. Text the [employer] at 913-410-0613. Summary Opioid medicines can help you manage moderate to severe pain for a short period of time. A pain treatment plan is an agreement between you and your health care provider. Discuss the goals of your treatment, including how much pain you might expect to have and how you will manage the pain. If you think that you or someone else may have taken too much of an opioid, get medical help right away. This information is not intended to replace advice given to you by your health care provider. Make sure you discuss any questions you have with your health care provider. Document Revised: 02/03/2023 Document Reviewed: 08/09/2020 [employer] Patient Education  2024 [employer] Advanced directives: (In Chart) A copy of your advanced directives are scanned into your chart should your provider ever need it.  Next Medicare Annual Wellness Visit scheduled for next year: Yes

## 2023-08-08 NOTE — Progress Notes (Addendum)
 Subjective:   Brandon [name] is a 68 y.o. who presents for a Medicare Wellness preventive visit.  Visit Complete: In person   Persons Participating in Visit: Patient.  AWV Questionnaire: Yes: Patient Medicare AWV questionnaire was completed by the patient on 08/04/23; I have confirmed that all information answered by patient is correct and no changes since this date.  Cardiac Risk Factors include: advanced age (>57men, >84 women);male gender;hypertension     Objective:    Today's Vitals   08/08/23 0841  BP: 120/62  Pulse: 60  Temp: 98.3 F (36.8 C)  TempSrc: Oral  SpO2: 94%  Weight: 213 lb (96.6 kg)  Height: 6\' 1"  (1.854 m)   Body mass index is 28.1 kg/m.     08/08/2023    8:58 AM 07/10/2023   11:20 AM 02/05/2023    8:49 PM 12/23/2022    3:11 PM 09/16/2022   11:48 AM 07/15/2022   12:33 PM 05/08/2022   11:31 AM  Advanced Directives  Does Patient Have a Medical Advance Directive? Yes Yes No Yes Yes Yes No  Type of Estate agent of [place];Living will   Healthcare Power of [place];Living will  Living will   Does patient want to make changes to medical advance directive? No - Patient declined No - Patient declined  No - Patient declined No - Patient declined    Copy of Healthcare Power of Attorney in Chart? Yes - validated most recent copy scanned in chart (See row information)     Yes - validated most recent copy scanned in chart (See row information)   Would patient like information on creating a medical advance directive?   No - Patient declined    No - Patient declined    Current Medications (verified) Outpatient Encounter Medications as of 08/08/2023  Medication Sig   acetaminophen (TYLENOL) 500 MG tablet Take 500-1,000 mg by mouth every 6 (six) hours as needed (for pain).   amoxicillin (AMOXIL) 500 MG capsule TAKE 4 CAPSULES (2,000 MG TOTAL) BY MOUTH AS NEEDED. 30-60 MINUTES BEFORE DENTAL PROCEDURE   atorvastatin (LIPITOR) 80 MG tablet PATIENT  MUST ATTEND ANNUAL APPOINTMENT FOR FUTURE REFILLS TAKE 1 TABLET BY MOUTH ONCE DAILY AT 6PM.   Carboxymethylcellulose Sodium (ARTIFICIAL TEARS OP) Place 1 drop into both eyes daily as needed (for dryness or irritation).   diphenhydrAMINE (BENADRYL) 25 mg capsule Take 25 mg by mouth every 8 (eight) hours as needed.   dorzolamide (TRUSOPT) 2 % ophthalmic solution Place 1 drop into the left eye 3 (three) times daily.   eplerenone (INSPRA) 25 MG tablet Take 2 tablets (50 mg total) by mouth in the morning.   ezetimibe (ZETIA) 10 MG tablet TAKE 1 TABLET BY MOUTH DAILY   gabapentin (NEURONTIN) 300 MG capsule Take 1 capsule (300 mg total) by mouth 3 (three) times daily.   ketorolac (ACULAR) 0.5 % ophthalmic solution Place 1 drop into the left eye 4 (four) times daily.   metoprolol succinate (TOPROL-XL) 25 MG 24 hr tablet Take 1 tablet (25 mg total) by mouth daily.   nystatin (MYCOSTATIN/NYSTOP) powder Apply 1 Application topically 3 (three) times daily.   pantoprazole (PROTONIX) 40 MG tablet TAKE 1 TABLET BY MOUTH TWICE A DAY   potassium chloride SA (KLOR-CON M20) 20 MEQ tablet Take 1 tablet as needed when taking Torsemide   sertraline (ZOLOFT) 50 MG tablet Take 1 tablet (50 mg total) by mouth daily. Pt taking 50 mg daily   tamsulosin (FLOMAX) 0.4 MG CAPS  capsule TAKE 1 CAPSULE BY MOUTH EVERY DAY   torsemide (DEMADEX) 20 MG tablet Take 1 tablet (20 mg total) by mouth as needed.   traMADol (ULTRAM) 50 MG tablet TAKE 1-2 TABLETS BY MOUTH TWICE A DAY FOR 30 DAYS   warfarin (COUMADIN) 2.5 MG tablet TAKE 7.5 MG (3 TABLETS) EVERY MONDAY/FRIDAY AND 5 MG (2 TABLETS) ALL OTHER DAYS OR AS INSTRUCTED BY [facility] CL[facility]IC. (Patient taking differently: Take two of the 2.5 mg tablets for total of 5mg  on Mondays and all other days take 3 of the 2.5 mg tablets for total of 7.5mg  per patient)   ZYRTEC ALLERGY 10 MG tablet Take 10 mg by mouth daily.   No facility-administered encounter medications on file as of 08/08/2023.     Allergies (verified) Patient has no known allergies.   History: Past Medical History:  Diagnosis Date   AICD (automatic cardioverter/defibrillator) present 08/31/2019   Ankylosing spondylitis (HCC)    Arthritis    BENIGN PROSTATIC HYPERTROPHY 06/07/2008   CHF (congestive heart failure) (HCC)    COLONIC POLYPS, HX OF 06/07/2008   Coronary artery disease    Depression    H/O hiatal hernia    Heart failure (HCC)    History of radiation therapy    Left Lung- 08/06/22-08/16/22- Dr. Antony Blackbird   HYPERLIPIDEMIA 06/07/2008   HYPERTENSION 06/07/2008   Myocardial infarction [facility]) 2005   NSTEMI, s/p LAD stent   NEPHROLITHIASIS, HX OF 06/07/2008   STEMI (ST elevation myocardial infarction) (HCC) 04/27/2019   Past Surgical History:  Procedure Laterality Date   AORTIC VALVE REPLACEMENT N/A 04/06/2021   Procedure: AORTIC VALVE REPLACEMENT (AVR) WITH INSPIRIS RESILIA  AORTIC VALVE SIZE ;  Surgeon: Alleen Borne, MD;  Location[facility] OR;  Service: Open Heart Surgery;  Laterality: N/A;   COLONOSCOPY WITH PROPOFOL N/A 04/24/2020   Procedure: COLONOSCOPY WITH PROPOFOL;  Surgeon: Benancio Deeds, MD;  Location: [facility] ENDOSCOPY;  Service: Gastroenterology;  Laterality: N/A;   CORONARY ANGIOPLASTY WITH STENT PLACEMENT  2005   LAD stent, jailed diagonal   CORONARY/GRAFT ACUTE MI REVASCULARIZATION N/A 04/27/2019   Procedure: Coronary/Graft Acute MI Revascularization;  Surgeon: Corky Crafts, MD;  Location: [facility] INVASIVE CV LAB;  Service: Cardiovascular;  Laterality: N/A;   ESOPHAGOGASTRODUODENOSCOPY (EGD) WITH PROPOFOL N/A 02/06/2023   Procedure: ESOPHAGOGASTRODUODENOSCOPY (EGD) WITH PROPOFOL;  Surgeon: Imogene Burn, MD;  Location: [facility] ENDOSCOPY;  Service: Gastroenterology;  Laterality: N/A;   HOT HEMOSTASIS N/A 02/06/2023   Procedure: HOT HEMOSTASIS (ARGON PLASMA COAGULATION/BICAP);  Surgeon: Imogene Burn, MD;  Location: [facility] ENDOSCOPY;  Service: Gastroenterology;  Laterality: N/A;    ICD IMPLANT  08/31/2019   ICD IMPLANT N/A 08/31/2019   Procedure: ICD IMPLANT;  Surgeon: Hillis Range, MD;  Location: [facility] INVASIVE CV LAB;  Service: Cardiovascular;  Laterality: N/A;   ICD LEAD REMOVAL N/A 12/04/2020   Procedure: ICD SYSTEM EXTRACTION;  Surgeon: Lanier Prude, MD;  Location: [facility] OR;  Service: Cardiovascular;  Laterality: N/A;   INSERTION OF IMPLANTABLE LEFT VENTRICULAR ASSIST DEVICE N/A 04/06/2021   Procedure: INSERTION OF IMPLANTABLE LEFT VENTRICULAR ASSIST DEVICE;  Surgeon: Alleen Borne, MD;  Location: [facility] OR;  Service: Open Heart Surgery;  Laterality: N/A;  HM3   IR THORACENTESIS ASP PLEURAL SPACE W/IMG GUIDE  04/19/2021   IR THORACENTESIS ASP PLEURAL SPACE W/IMG GUIDE  04/20/2021   IR THORACENTESIS ASP PLEURAL SPACE W/IMG GUIDE  04/23/2021   LAMINECTOMY     lumbar   LEFT HEART CATH AND CORONARY ANGIOGRAPHY N/A  04/27/2019   Procedure: LEFT HEART CATH AND CORONARY ANGIOGRAPHY;  Surgeon: Corky Crafts, MD;  Location: [facility] INVASIVE CV LAB;  Service: Cardiovascular;  Laterality: N/A;   LUMBAR FUSION  01/2012   POLYPECTOMY  04/24/2020   Procedure: POLYPECTOMY;  Surgeon: Benancio Deeds, MD;  Location: [facility] ENDOSCOPY;  Service: Gastroenterology;;   RIGHT HEART CATH N/A 04/27/2019   Procedure: RIGHT HEART CATH;  Surgeon: Swazil[place], Daniele M, MD;  Location: [facility] INVASIVE CV LAB;  Service: Cardiovascular;  Laterality: N/A;   RIGHT HEART CATH N/A 03/28/2021   Procedure: RIGHT HEART CATH;  Surgeon: Dolores Patty, MD;  Location: [facility] INVASIVE CV LAB;  Service: Cardiovascular;  Laterality: N/A;   RIGHT HEART CATH AND CORONARY ANGIOGRAPHY N/A 05/06/2022   Procedure: RIGHT HEART CATH AND CORONARY ANGIOGRAPHY;  Surgeon: Dolores Patty, MD;  Location: [facility] INVASIVE CV LAB;  Service: Cardiovascular;  Laterality: N/A;   RIGHT/LEFT HEART CATH AND CORONARY ANGIOGRAPHY N/A 11/29/2020   Procedure: RIGHT/LEFT HEART CATH AND CORONARY ANGIOGRAPHY;  Surgeon: Dolores Patty, MD;   Location: [facility] INVASIVE CV LAB;  Service: Cardiovascular;  Laterality: N/A;   TEE WITHOUT CARDIOVERSION N/A 12/04/2020   Procedure: TRANSESOPHAGEAL ECHOCARDIOGRAM (TEE);  Surgeon: Lanier Prude, MD;  Location: [facility] OR;  Service: Cardiovascular;  Laterality: N/A;   TEE WITHOUT CARDIOVERSION N/A 04/06/2021   Procedure: TRANSESOPHAGEAL ECHOCARDIOGRAM (TEE);  Surgeon: Alleen Borne, MD;  Location: [facility] OR;  Service: Open Heart Surgery;  Laterality: N/A;   TONSILLECTOMY     warthin tumor removal Left 2014   Family History  Problem Relation Age of Onset   Depression Father    Arthritis Sister        RA   Heart failure Mother    Other Neg Hx    Colon cancer Neg Hx    Esophageal cancer Neg Hx    Rectal cancer Neg Hx    Stomach cancer Neg Hx    Colon polyps Neg Hx    Social History   Socioeconomic History   Marital status: Single    Spouse name: Not on file   Number of children: Not on file   Years of education: Not on file   Highest education level: Some college, no degree  Occupational History   Occupation: Event organiser: Consulting civil engineer   Occupation: Retired  Tobacco Use   Smoking status: Former    Current packs/day: 0.00    Average packs/day: 1 pack/day for 40.0 years (40.0 ttl pk-yrs)    Types: Cigarettes    Start date: 07/26/1979    Quit date: 07/26/2019    Years since quitting: 4.0   Smokeless tobacco: Never   Tobacco comments:    1-1.5 pks per year x 40-45 yrs  Vaping Use   Vaping status: Some Days   Substances: CBD  Substance and Sexual Activity   Alcohol use: Yes    Alcohol/week: 7.0 standard drinks of alcohol    Types: 7 Shots of liquor per week    Comment: "2 vodka tonics a night"   Drug use: Yes    Comment: occasionally - CBD oil   Sexual activity: Not Currently  Other Topics Concern   Not on file  Social History Narrative   Lives in [place] alone      Social Drivers of Health   Financial Resource Strain: Low Risk  (08/08/2023)   Overall  Financial Resource Strain (CARDIA)    Difficulty of Paying Living Expenses: Not hard at all  Food Insecurity: No Food Insecurity (08/08/2023)   Hunger Vital Sign    Worried About Running Out of Food in the Last Year: Never true    Ran Out of Food in the Last Year: Never true  Transportation Needs: No Transportation Needs (08/08/2023)   PRAPARE - Administrator, Civil Service (Medical): No    Lack of Transportation (Non-Medical): No  Physical Activity: Sufficiently Active (08/08/2023)   Exercise Vital Sign    Days of Exercise per Week: 7 days    Minutes of Exercise per Session: 60 min  Stress: No Stress Concern Present (08/08/2023)   [employer] of Occupational Health - Occupational Stress Questionnaire    Feeling of Stress : Not at all  Social Connections: Socially Isolated (08/08/2023)   Social Connection and Isolation Panel [NHANES]    Frequency of Communication with Friends and Family: More than three times a week    Frequency of Social Gatherings with Friends and Family: Three times a week    Attends Religious Services: Never    Active Member of Clubs or Organizations: No    Attends Engineer, structural: Not on file    Marital Status: Never married    Tobacco Counseling Counseling given: Not Answered Tobacco comments: 1-1.5 pks per year x 40-45 yrs    Clinical Intake:  Pre-visit preparation completed: Yes  Pain : No/denies pain     BMI - recorded: 28.1 Nutritional Status: BMI 25 -29 Overweight Nutritional Risks: None Diabetes: No  Lab Results  Component Value Date   HGBA1C 6.0 (H) 04/07/2021   HGBA1C 6.4 (H) 11/30/2020   HGBA1C 6.0 (H) 04/27/2019     How often do you need to have someone help you when you read instructions, pamphlets, or other written materials from your doctor or pharmacy?: 1 - Never  Interpreter Needed?: No  Information entered by :: Theresa Mulligan LPN   Activities of Daily Living     08/08/2023    8:57 AM  08/04/2023    5:31 PM  In your present state of health, do you have any difficulty performing the following activities:  Hearing? 0 0  Vision? 0 0  Difficulty concentrating or making decisions? 0 0  Walking or climbing stairs? 0 0  Dressing or bathing? 0 0  Doing errands, shopping? 0 0  Preparing Food and eating ? N N  Using the Toilet? N N  In the past six months, have you accidently leaked urine? N N  Do you have problems with loss of bowel control? N N  Managing your Medications? N N  Managing your Finances? N N  Housekeeping or managing your Housekeeping? N N    Patient Care Team: Deeann Saint, MD as PCP - General (Family Medicine) Jens Som Madolyn Frieze, MD as PCP - Cardiology (Cardiology) Hillis Range, MD (Inactive) as PCP - Electrophysiology (Cardiology) Bensimhon, Bevelyn Buckles, MD as PCP - Advanced Heart Failure (Cardiology) Zenovia Jordan, MD as Consulting Physician (Rheumatology) Humberto Leep, MD as Referring Physician (Rheumatology) Lbcardiology, Rounding, MD as Rounding Team (Cardiology) Sherrill Raring, [facility] (Pharmacist)  Indicate any recent Medical Services you may have received from other than [facility] providers in the past year (date may be approximate).     Assessment:   This is a routine wellness examination for Pepe.  Hearing/Vision screen Hearing Screening - Comments:: Denies hearing difficulties   Vision Screening - Comments:: Wears reading glasses - up to date with routine eye exams with  [facility]  Goals Addressed               This Visit's Progress     Maintain Current activity (pt-stated)         Depression Screen     08/08/2023    8:39 AM 09/18/2022    4:03 PM 07/26/2022    9:28 AM 07/15/2022   12:39 PM 07/04/2022    1:21 PM 02/22/2022    1:37 PM 11/02/2021    1:41 PM  PHQ 2/9 Scores  PHQ - 2 Score 0 0 0 0 0 0 0  PHQ- 9 Score  1   1 0 2    Fall Risk     08/08/2023    8:57 AM 08/04/2023    5:31 PM 09/18/2022    2:00 PM 09/16/2022    3:23  PM 09/13/2022    1:42 PM  Fall Risk   Falls in the past year? 0 0 0 0 0  Number falls in past yr: 0 0 0 0 0  Injury with Fall? 0 0 0 0 0  Risk for fall due to : No Fall Risks  Impaired balance/gait;Impaired mobility;Orthopedic patient Impaired balance/gait;Orthopedic patient;Impaired mobility Impaired balance/gait;Impaired mobility;Orthopedic patient  Follow up Falls prevention discussed;Falls evaluation completed  Falls evaluation completed Falls evaluation completed Falls evaluation completed    MEDICARE RISK AT HOME:  Medicare Risk at Home Any stairs in or around the home?: No If so, are there any without handrails?: No Home free of loose throw rugs in walkways, pet beds, electrical cords, etc?: Yes Adequate lighting in your home to reduce risk of falls?: Yes Life alert?: No Use of a cane, walker or w/c?: No Grab bars in the bathroom?: No Shower chair or bench in shower?: No Elevated toilet seat or a handicapped toilet?: No  TIMED UP AND GO:  Was the test performed?  Yes  Length of time to ambulate 10 feet: 10 sec Gait steady and fast without use of assistive device  Cognitive Function: 6CIT completed        08/08/2023    8:59 AM 07/26/2022    9:45 AM 07/19/2021    1:35 PM  6CIT Screen  What Year? 0 points 0 points 0 points  What month? 0 points 0 points 0 points  What time? 0 points 0 points 0 points  Count back from 20 0 points 0 points 0 points  Months in reverse 0 points 0 points 0 points  Repeat phrase 0 points 0 points 0 points  Total Score 0 points 0 points 0 points    Immunizations Immunization History  Administered Date(s) Administered   Influenza Split 02/01/2012   Influenza Whole 02/25/2011   Influenza,inj,Quad PF,6+ Mos 03/29/2013, 03/04/2017, 04/13/2018   Influenza-Unspecified 02/01/2014, 01/14/2015, 02/20/2020, 01/11/2022   Moderna Covid-19 Fall Seasonal Vaccine 8yrs & older 07/11/2023   PFIZER(Purple Top)SARS-COV-2 Vaccination 06/18/2019, 07/13/2019,  02/22/2020, 08/10/2020   Pfizer Covid-19 Vaccine Bivalent Booster 5y-11y 01/11/2022   Pneumococcal Conjugate-13 02/06/2019   Pneumococcal Polysaccharide-23 04/19/2020   Respiratory Syncytial Virus Vaccine,Recomb Aduvanted(Arexvy) 01/11/2022   Tdap 09/12/2011   Zoster Recombinant(Shingrix) 08/13/2016, 10/26/2016   Zoster, Live 12/04/2013    Screening Tests Health Maintenance  Topic Date Due   DTaP/Tdap/Td (2 - Td or Tdap) 09/11/2021   INFLUENZA VACCINE  12/12/2022   Colonoscopy  04/25/2023   COVID-19 Vaccine (7 - Pfizer risk 2024-25 season) 01/08/2024   Medicare Annual Wellness (AWV)  08/07/2024   Pneumonia Vaccine 53+ Years old  Completed   Hepatitis  C Screening  Completed   Zoster Vaccines- Shingrix  Completed   HPV VACCINES  Aged Out   Lung Cancer Screening  Discontinued    Health Maintenance  Health Maintenance Due  Topic Date Due   DTaP/Tdap/Td (2 - Td or Tdap) 09/11/2021   INFLUENZA VACCINE  12/12/2022   Colonoscopy  04/25/2023   Health Maintenance Items Addressed:    Additional Screening:  Vision Screening: Recommended annual ophthalmology exams for early detection of glaucoma and other disorders of the eye.  Dental Screening: Recommended annual dental exams for proper oral hygiene  Community Resource Referral / Chronic Care Management: CRR required this visit?  No   CCM required this visit?  No     Plan:     I have personally reviewed and noted the following in the patient's chart:   Medical and social history Use of alcohol, tobacco or illicit drugs  Current medications and supplements including opioid prescriptions. Patient is currently taking opioid prescriptions. Information provided to patient regarding non-opioid alternatives. Patient advised to discuss non-opioid treatment plan with their provider. Functional ability and status Nutritional status Physical activity Advanced directives List of other physicians Hospitalizations, surgeries, and  ER visits in previous 12 months Vitals Screenings to include cognitive, depression, and falls Referrals and appointments  In addition, I have reviewed and discussed with patient certain preventive protocols, quality metrics, and best practice recommendations. A written personalized care plan for preventive services as well as general preventive health recommendations were provided to patient.     Tillie Rung, LPN   06/26/863   After Visit Summary: Given to patient  Notes: Nothing significant to report at this time.

## 2023-08-09 IMAGING — DX DG ABDOMEN 1V
3 series · 3 of 3 positions shown · non-contrast
Comparison: Abdominal radiograph 11/30/2020.

CLINICAL DATA: 65-year-old male with history of abdominal pain.

EXAM:
ABDOMEN - 1 VIEW

[abdomen supine (1 of 3)]
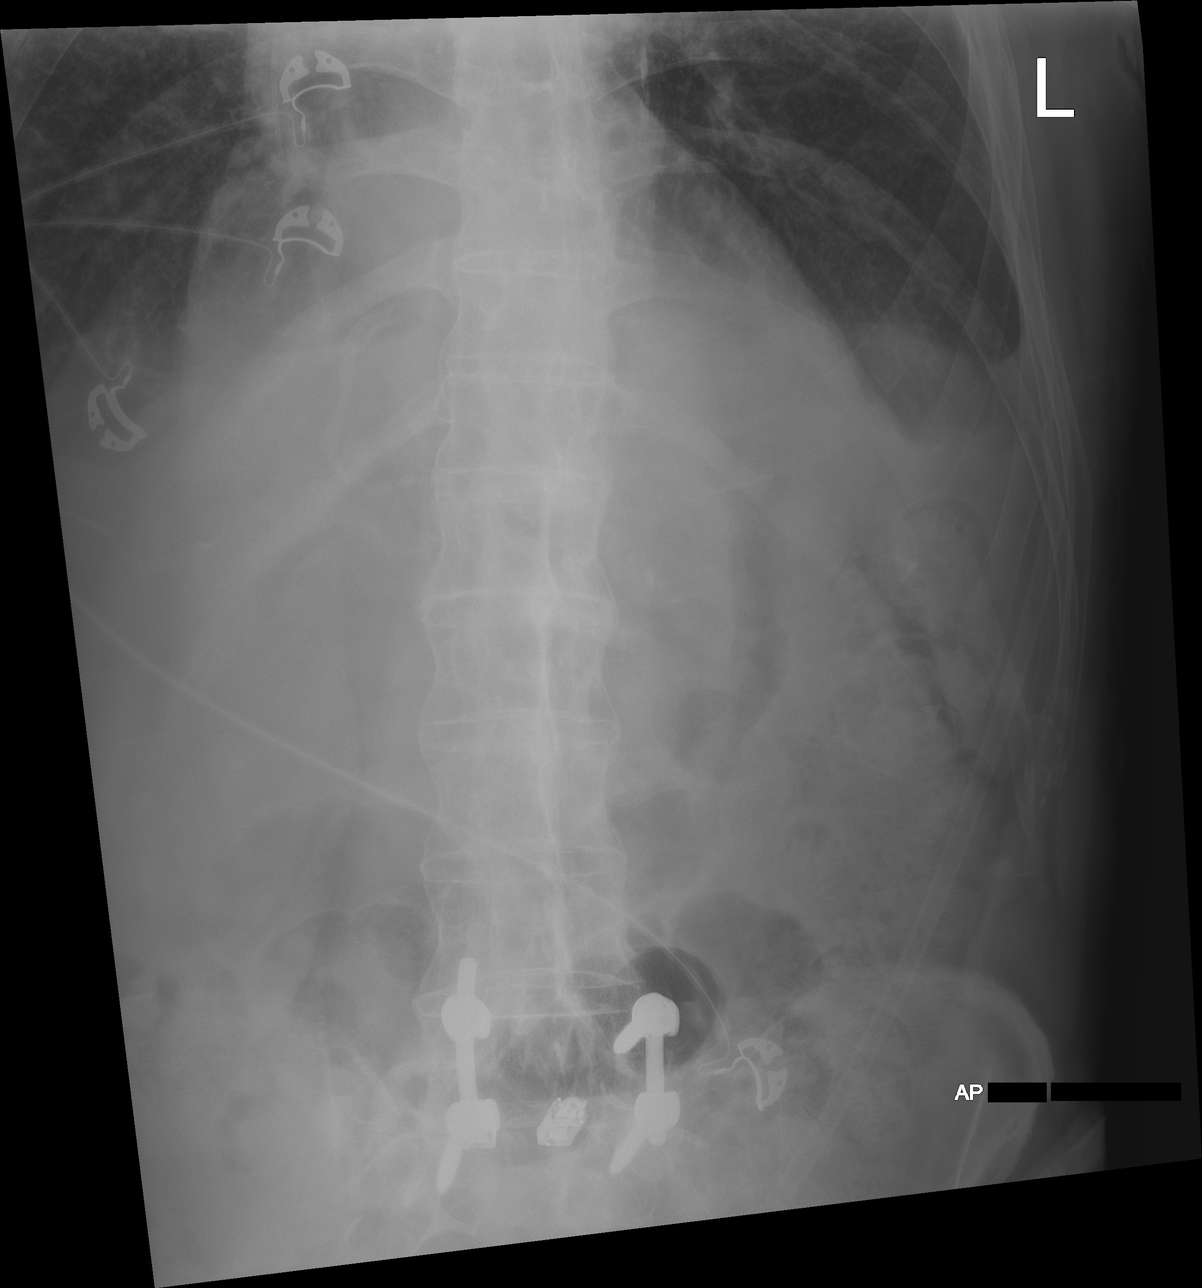

[abdomen supine (2 of 3)]
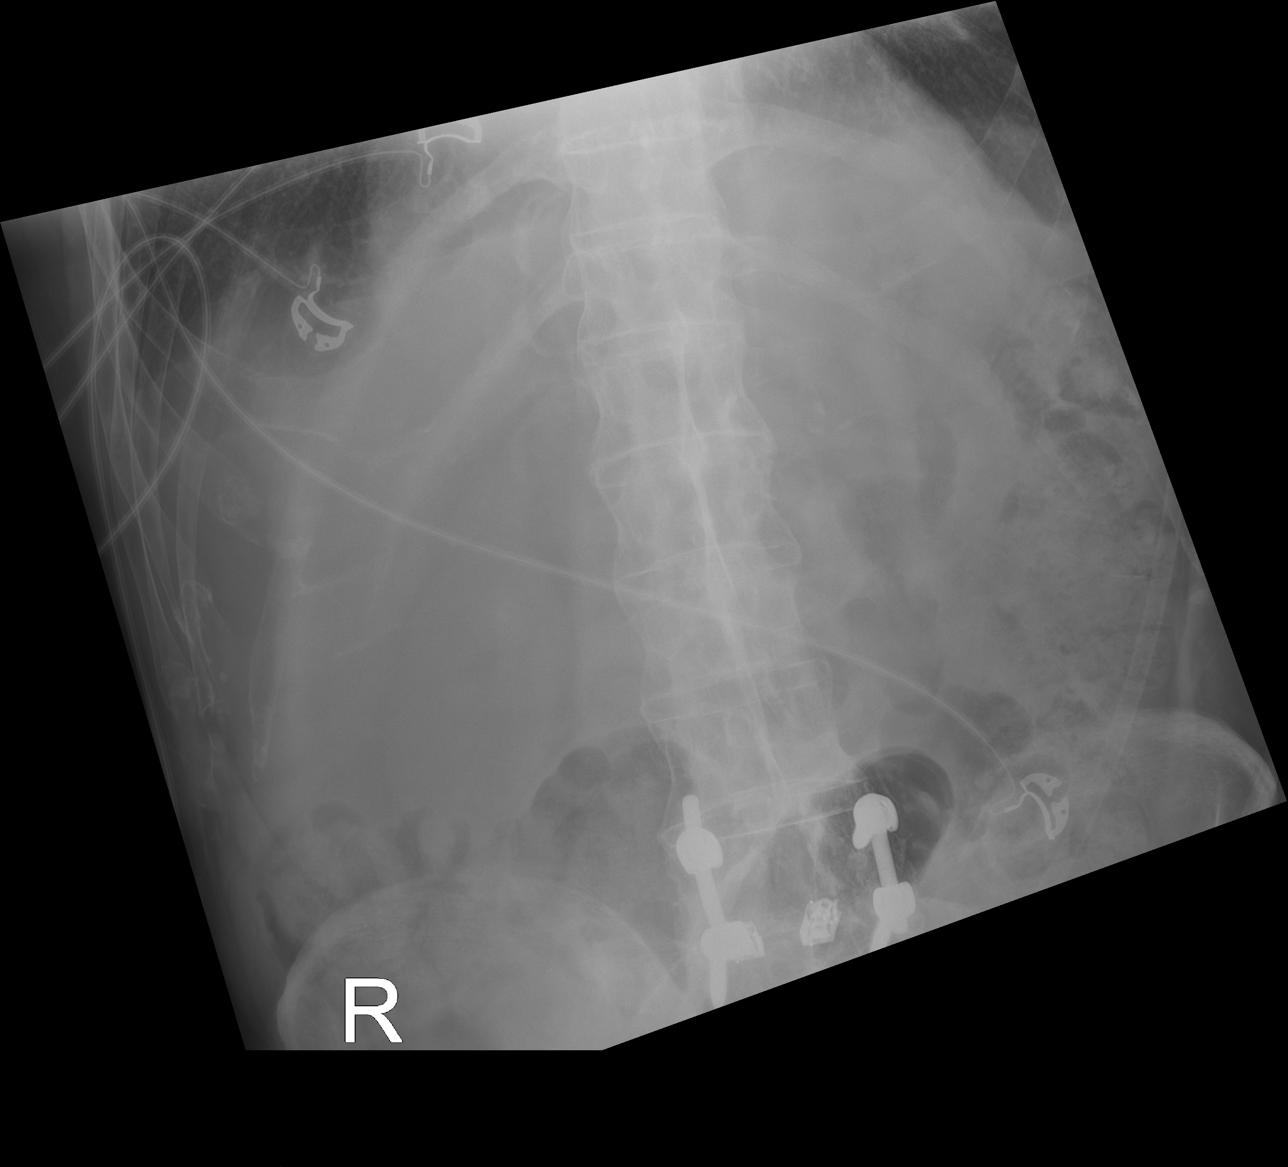

[abdomen supine (3 of 3)]
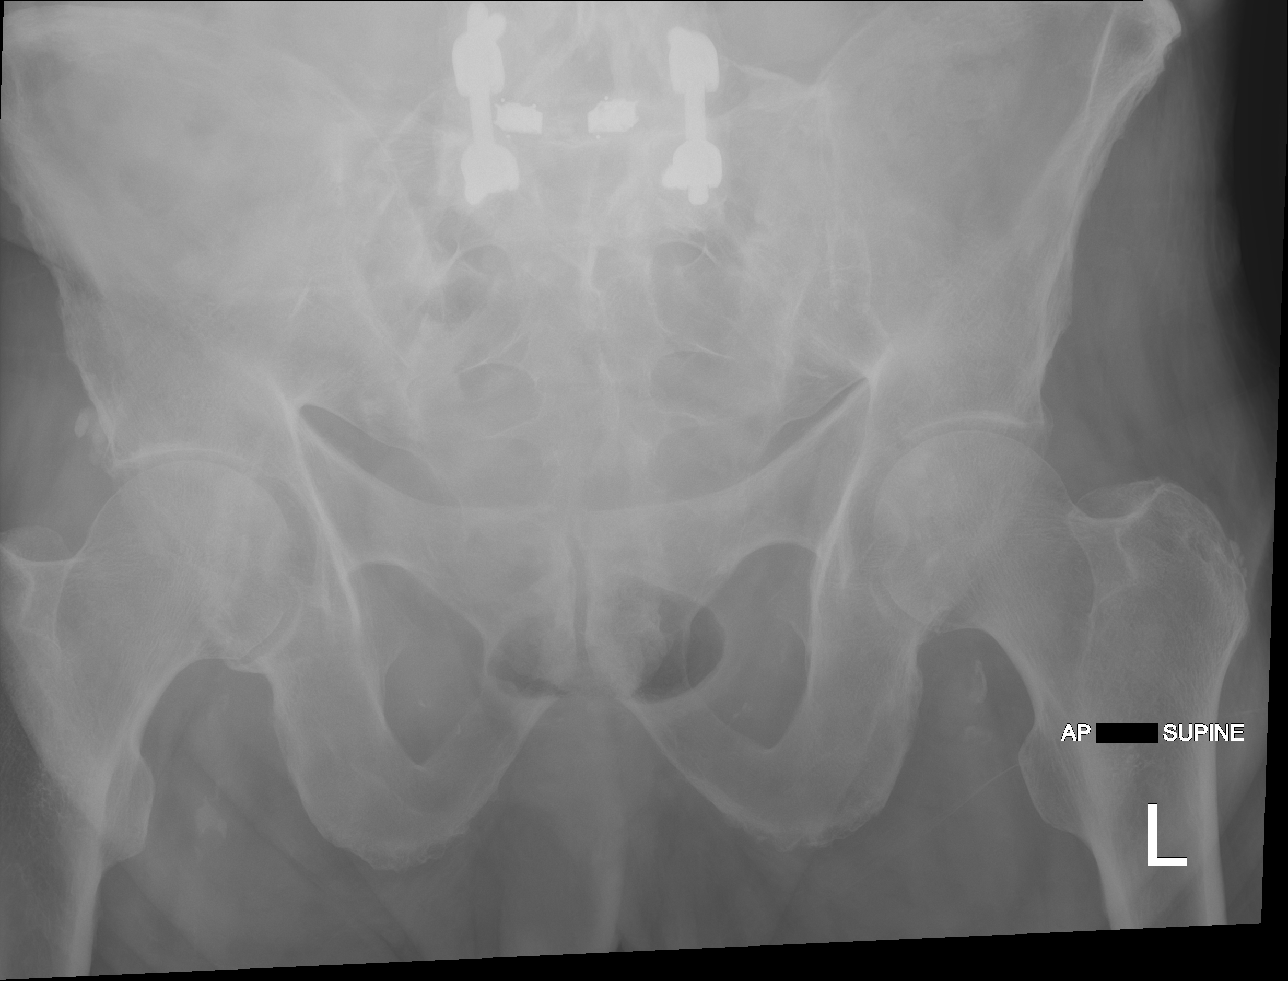

[3 of 3 positions shown; findings below may reference images not displayed]

FINDINGS: There is a paucity of bowel gas in the epigastric region and central
abdomen, which could indicate a space occupying lesion or
fluid-filled distended stomach. No pathologic dilatation of small
bowel or colon. Gas and stool are noted throughout the colon and
distal rectum. Rod and screw fixation hardware in the lower lumbar
spine.
IMPRESSION: 1. Unusual but nonobstructive bowel gas pattern. The possibility of
a distended fluid-filled stomach or space-occupying lesion in the
upper abdomen warrants consideration. Further evaluation with CT the
abdomen and pelvis with IV contrast should be considered if
clinically appropriate.

## 2023-08-09 IMAGING — DX DG ABDOMEN 1V
1 series · 1 of 1 positions shown · non-contrast
Comparison: Study done earlier today

CLINICAL DATA: Abdominal pain

EXAM:
ABDOMEN - 1 VIEW

[abdomen supine]
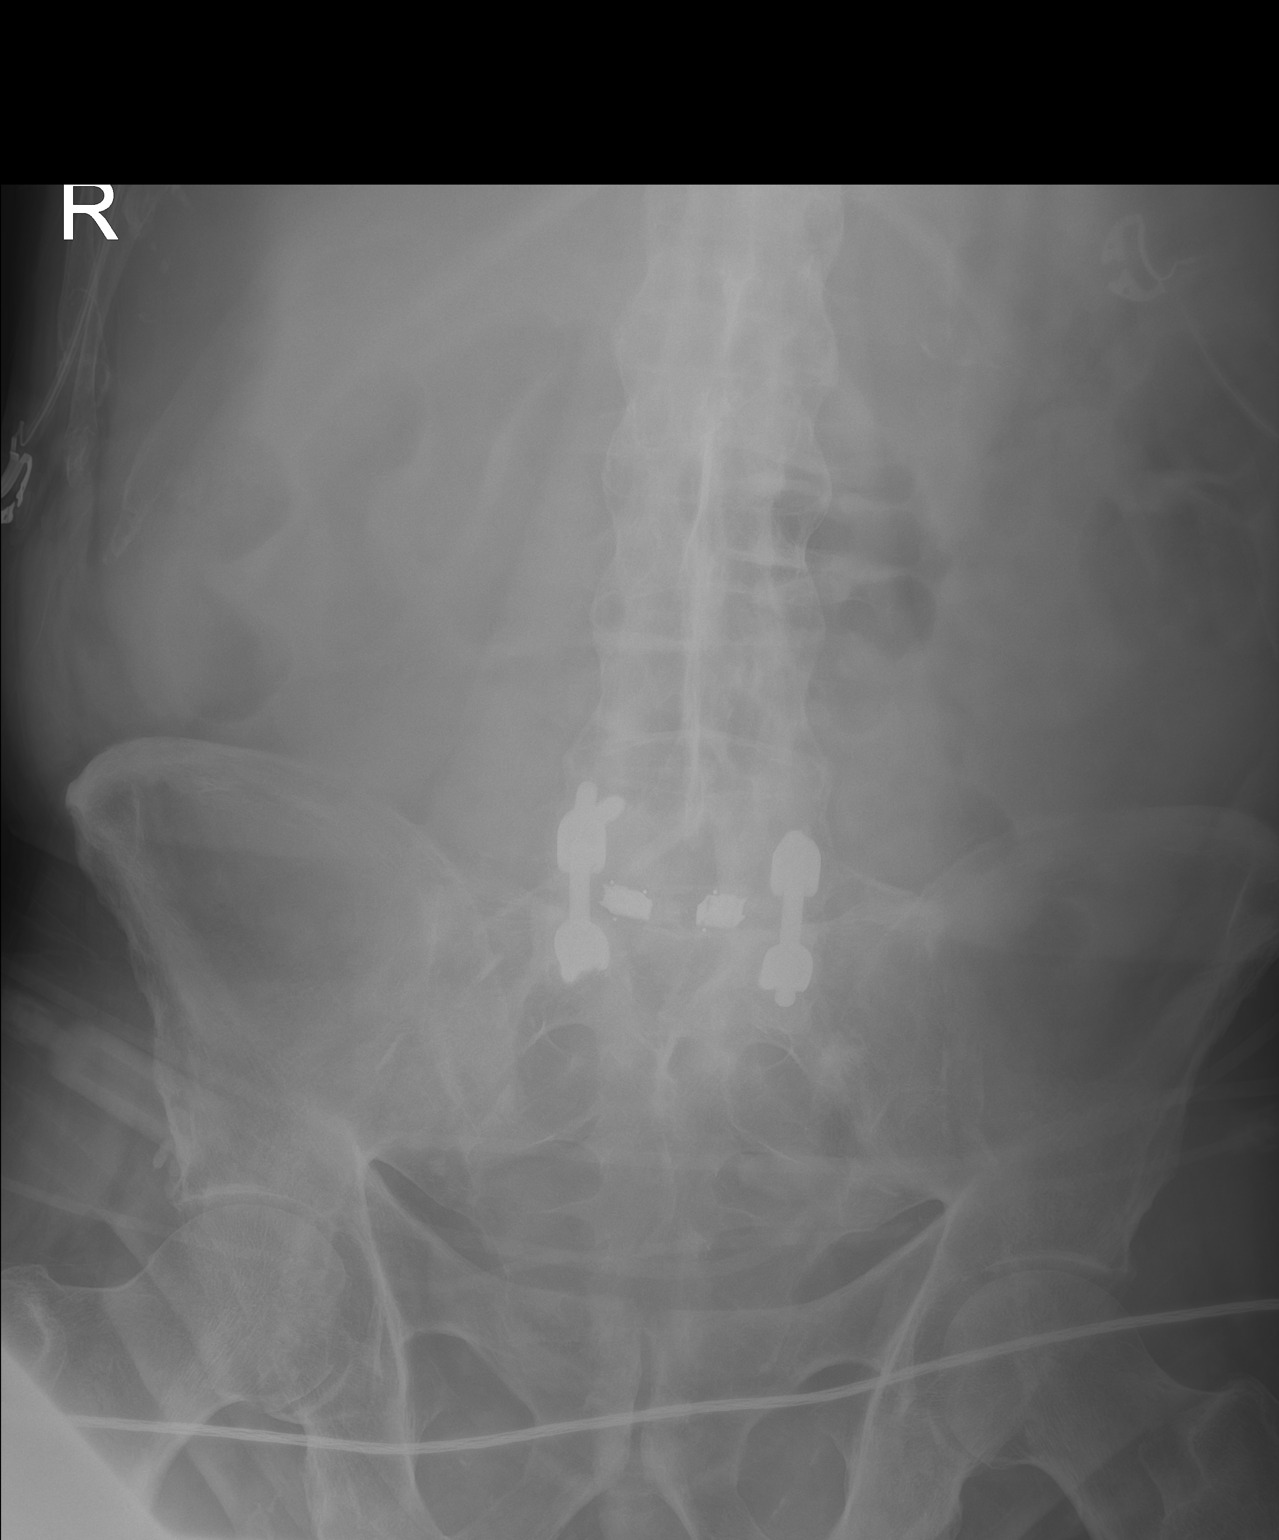

[1 of 1 positions shown; findings below may reference images not displayed]

FINDINGS: Bowel gas pattern is nonspecific. Moderate amount of gas is seen in
the colon. There is no evidence of fecal impaction in the
rectosigmoid. No abnormal calcifications are seen. Surgical fusion
is seen at L5-S1 level. Diffuse calcifications in the spinal
ligaments and possible fusion of SI joints suggest ankylosing
spondylitis.
IMPRESSION: Nonspecific bowel gas pattern.

## 2023-08-11 ENCOUNTER — Telehealth (HOSPITAL_COMMUNITY): Payer: Self-pay | Admitting: Licensed Clinical Social Worker

## 2023-08-11 IMAGING — DX DG CHEST 1V PORT
1 series · 2 of 2 positions shown · non-contrast
Comparison: Chest x-ray 03/21/2021.

CLINICAL DATA: Post left ventricular assist device placement.

EXAM:
PORTABLE CHEST 1 VIEW

[Series 1: chest · 0.14mm/px · 2 of 2 slices shown]
[im 1/2]
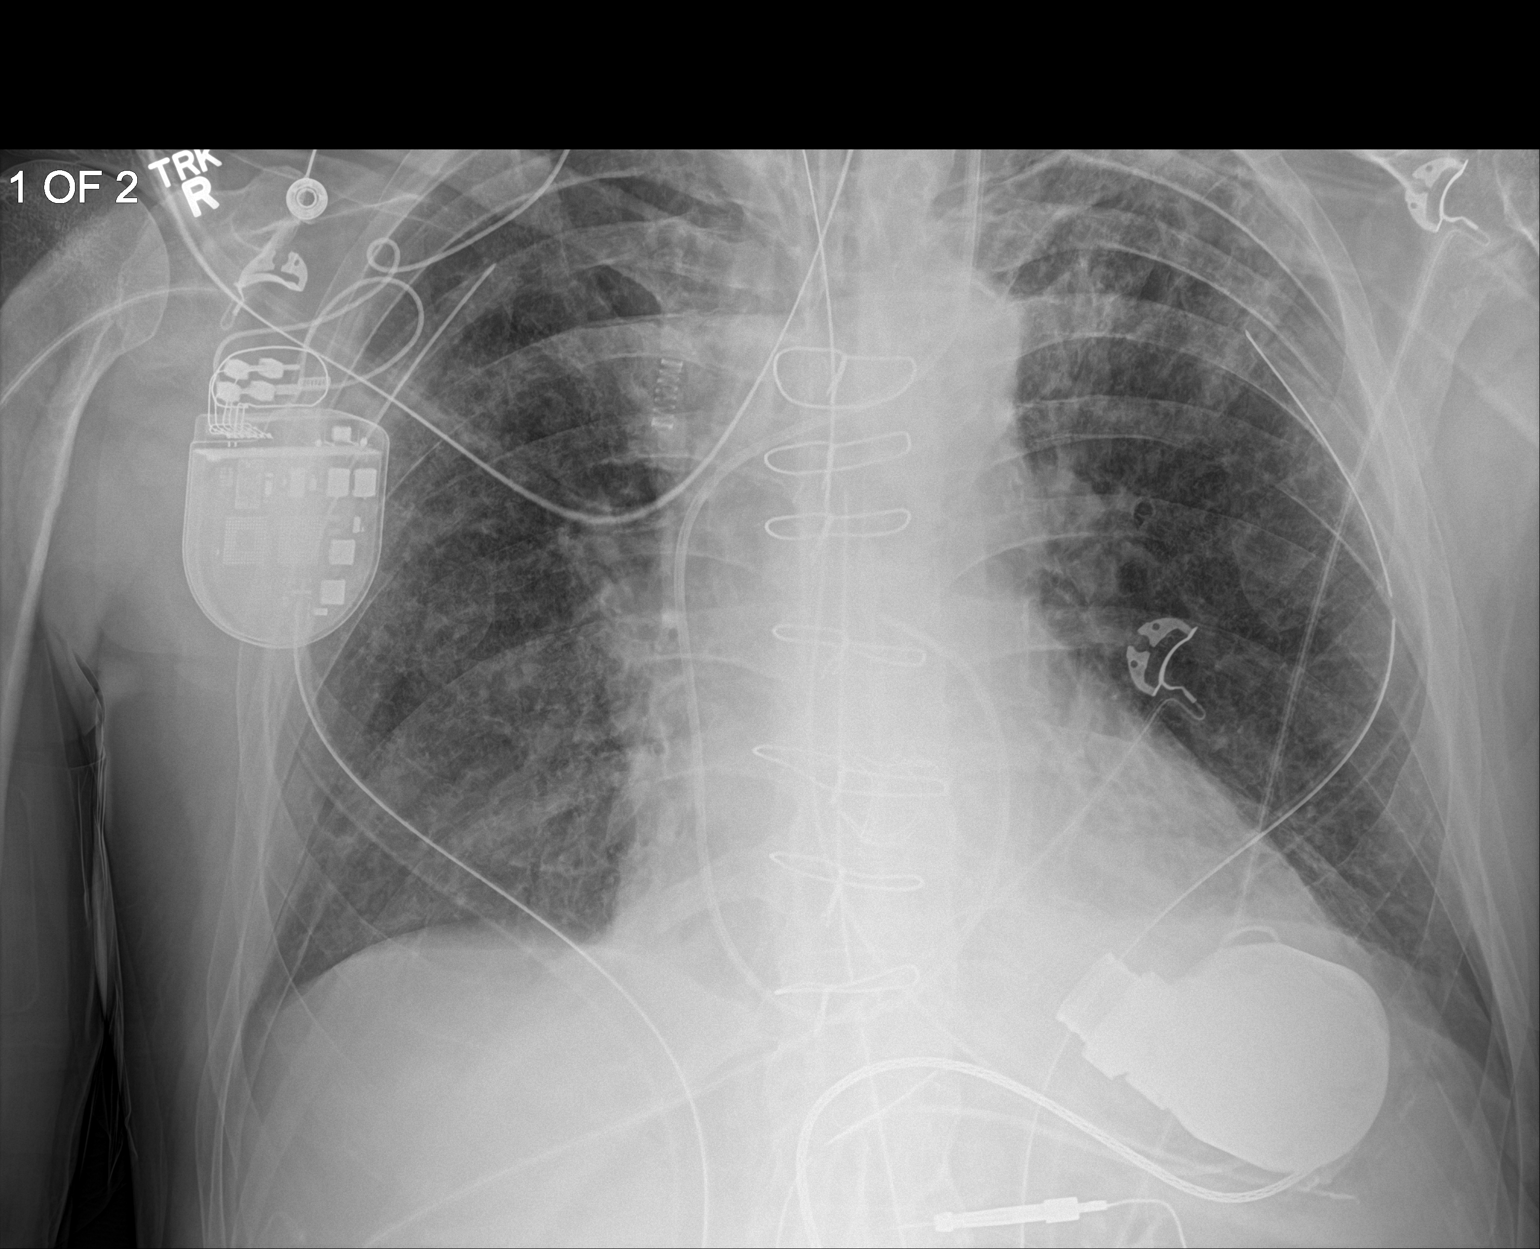
[im 2/2]
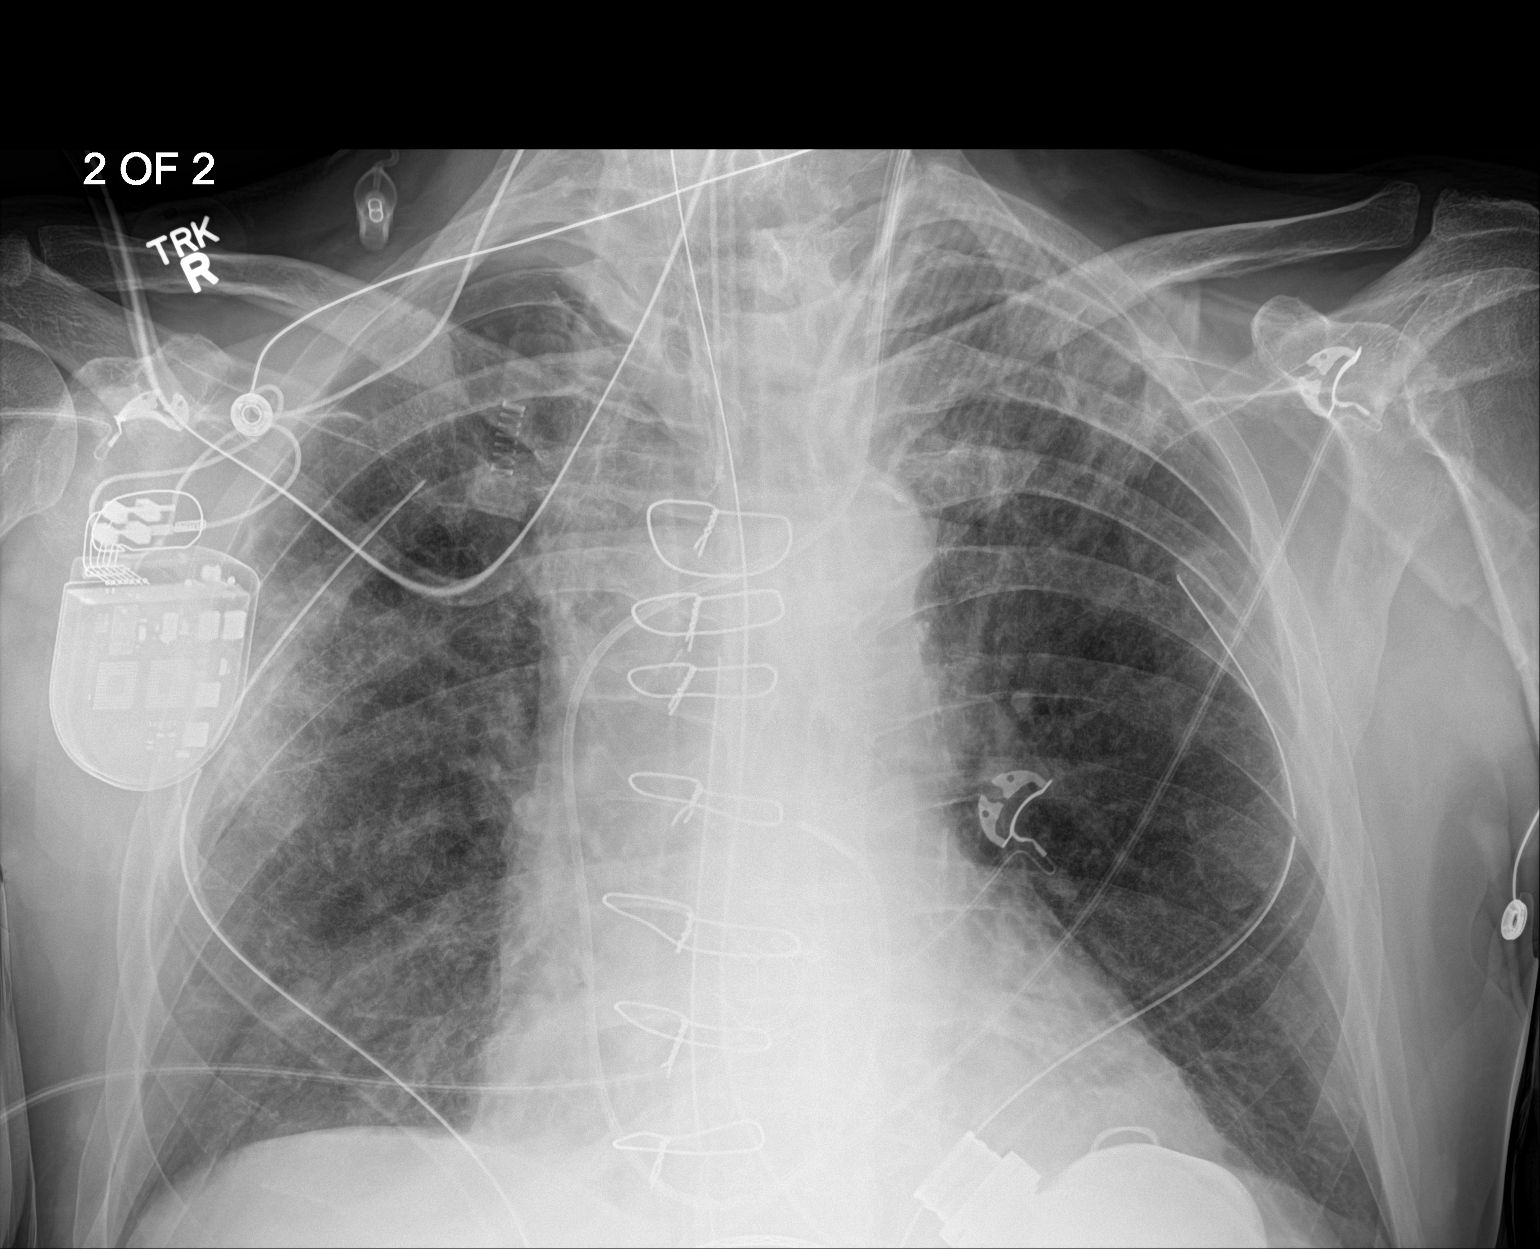

[2 of 2 positions shown; findings below may reference images not displayed]

FINDINGS: Endotracheal tube tip is 4.6 cm above the carina. There is a new
left ventricular cyst device in place. There are new sternotomy
wires and prosthetic heart valve. Mediastinal drain and bilateral
pleural drains are present. Swan-Ganz catheter is present with
distal tip projecting over the main pulmonary artery. Right-sided
generator is unchanged in position.

The cardiomediastinal silhouette appears within normal limits. There
are diffuse interstitial opacities, right greater than left, similar
to the prior exam. There is no pleural effusion. There is likely a
tiny right apical pneumothorax measuring 9 mm from the lung apex.
IMPRESSION: 1. New postsurgical changes as above.
2. Small right apical pneumothorax.
3. Stable bilateral interstitial prominence.

## 2023-08-11 NOTE — Telephone Encounter (Signed)
 Patient attended the [facility] men's Support Group last week on 08-07-23 and was very positive about the group. CSW will add patient to ongoing distribution lists for upcoming meetings. Lasandra Beech, LCSW, CCSW-MCS [phone]

## 2023-08-12 ENCOUNTER — Other Ambulatory Visit (HOSPITAL_COMMUNITY): Payer: Self-pay | Admitting: Internal Medicine

## 2023-08-12 IMAGING — DX DG CHEST 1V PORT
1 series · 1 of 1 positions shown · non-contrast
Comparison: 04/06/2021

CLINICAL DATA: Acute respiratory failure. Endotracheally intubated.
Hypoxia.

EXAM:
PORTABLE CHEST 1 VIEW

[chest ap]
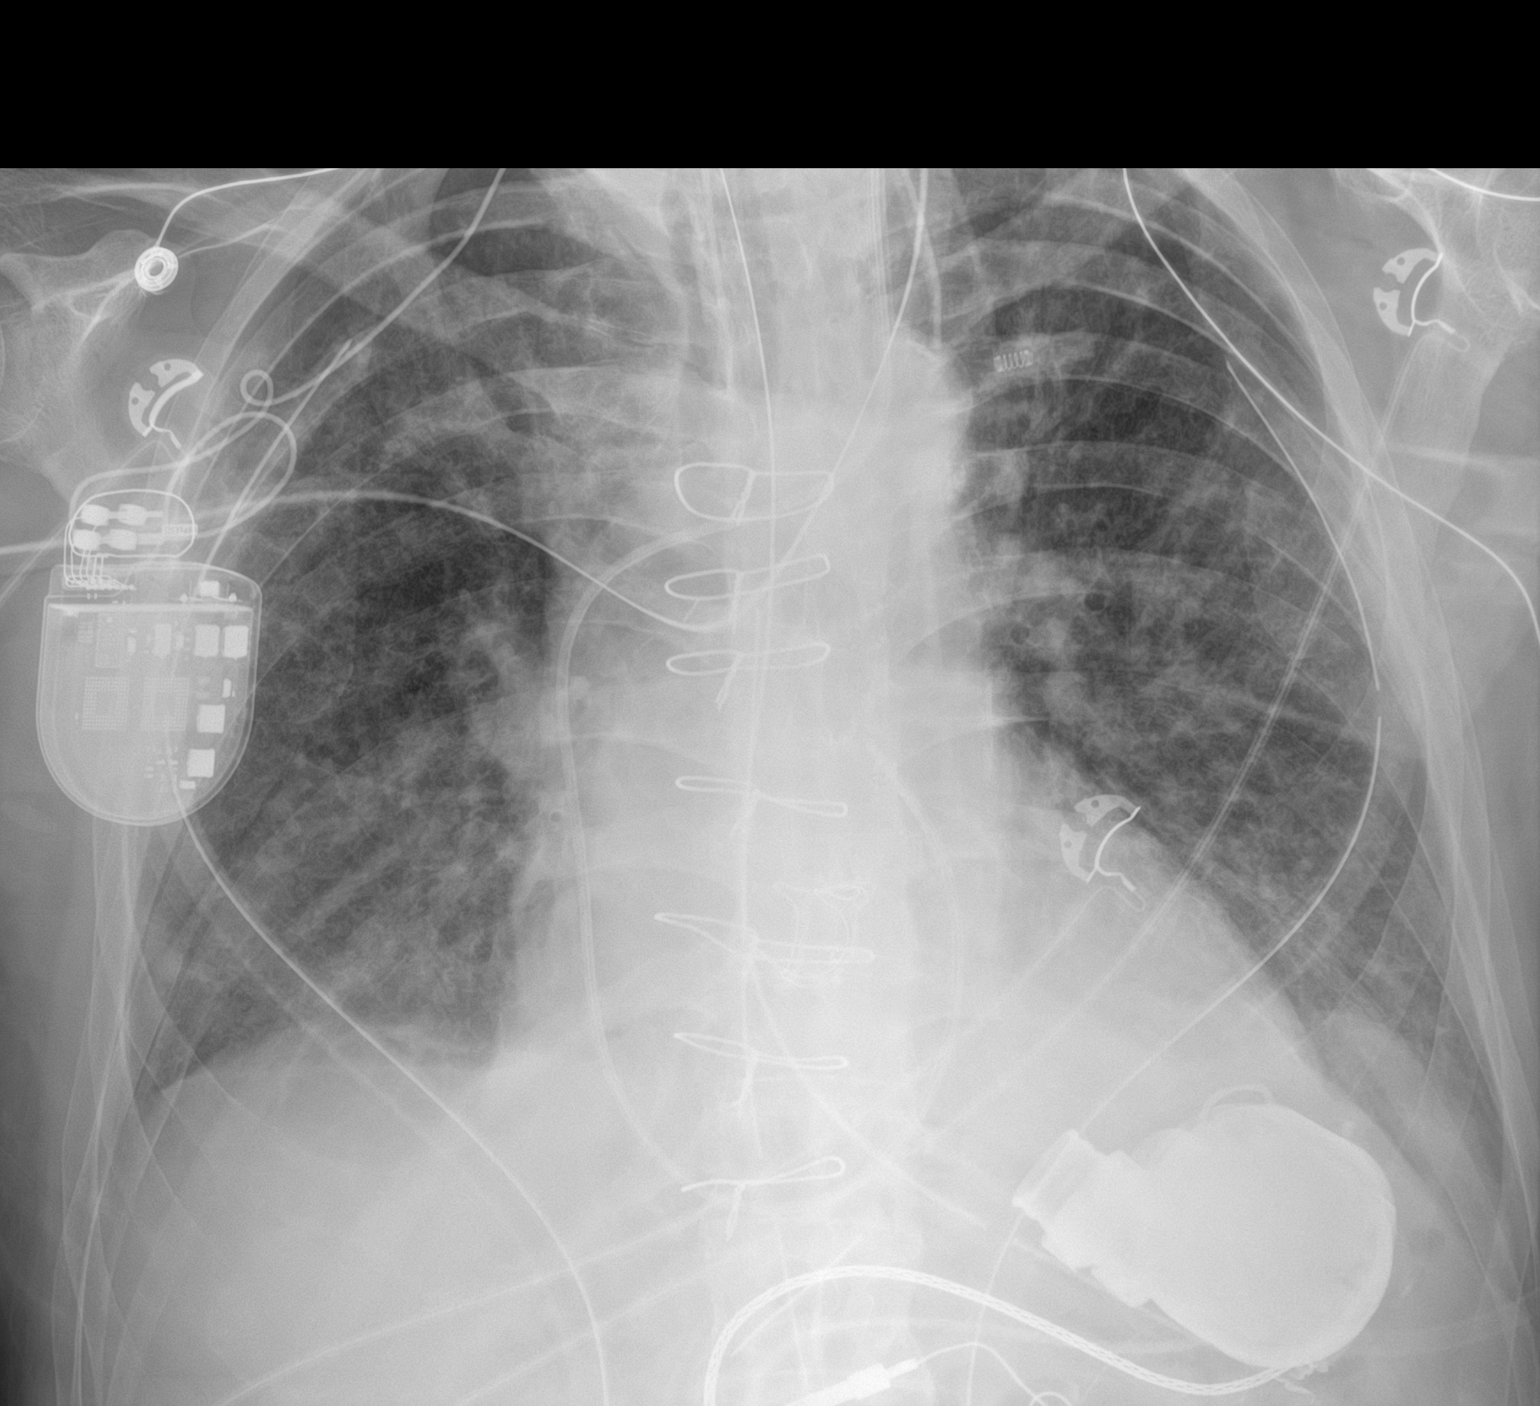

[1 of 1 positions shown; findings below may reference images not displayed]

FINDINGS: Support apparatus remains in stable position including bilateral
chest tubes. A tiny less than 5% right apical pneumothorax appears
slightly decreased since previous study.

Diffuse symmetric interstitial and airspace disease shows no
significant change, consistent with pulmonary edema. Cardiomegaly
remains stable.
IMPRESSION: Slight decrease in tiny less than 5% right apical pneumothorax.

No significant change in diffuse pulmonary edema.

## 2023-08-13 ENCOUNTER — Other Ambulatory Visit (HOSPITAL_COMMUNITY): Payer: Self-pay

## 2023-08-13 MED ORDER — GABAPENTIN 300 MG PO CAPS
300.0000 mg | ORAL_CAPSULE | Freq: Three times a day (TID) | ORAL | 3 refills | Status: DC
  Filled 2023-08-13: qty 90, 30d supply, fill #0
  Filled 2023-09-13: qty 90, 30d supply, fill #1
  Filled 2023-10-12 – 2023-10-14 (×2): qty 90, 30d supply, fill #2
  Filled 2023-11-11: qty 90, 30d supply, fill #3

## 2023-08-14 ENCOUNTER — Ambulatory Visit (HOSPITAL_COMMUNITY): Payer: Self-pay | Admitting: Pharmacist

## 2023-08-14 LAB — POCT INR: INR: 2.7 (ref 2.0–3.0)

## 2023-08-21 ENCOUNTER — Ambulatory Visit (HOSPITAL_COMMUNITY): Payer: Self-pay | Admitting: Pharmacist

## 2023-08-21 LAB — POCT INR: INR: 3.2 — AB (ref 2.0–3.0)

## 2023-08-28 ENCOUNTER — Ambulatory Visit (HOSPITAL_COMMUNITY): Payer: Self-pay | Admitting: Pharmacist

## 2023-08-28 LAB — POCT INR: INR: 2.8 (ref 2.0–3.0)

## 2023-09-04 ENCOUNTER — Ambulatory Visit (HOSPITAL_COMMUNITY): Payer: Self-pay | Admitting: Pharmacist

## 2023-09-04 ENCOUNTER — Other Ambulatory Visit: Payer: Self-pay | Admitting: Family Medicine

## 2023-09-04 DIAGNOSIS — M456 Ankylosing spondylitis lumbar region: Secondary | ICD-10-CM

## 2023-09-04 LAB — POCT INR: INR: 2.7 (ref 2.0–3.0)

## 2023-09-04 NOTE — Telephone Encounter (Unsigned)
 Copied from CRM [id number]. Topic: Clinical - Medication Refill >> Sep 04, 2023 12:04 PM Juluis Ok wrote: Most Recent Primary Care Visit:  Provider: Dewayne Ford  Department: [facility]  Visit Type: MEDICARE AWV, SEQUENTIAL  Date: 08/08/2023  Medication: traMADol  (ULTRAM ) 50 MG tablet, request 120 quantity  Has the patient contacted their pharmacy? Yes (Agent: If no, request that the patient contact the pharmacy for the refill. If patient does not wish to contact the pharmacy document the reason why and proceed with request.) (Agent: If yes, when and what did the pharmacy advise?)  Is this the correct pharmacy for this prescription? Yes If no, delete pharmacy and type the correct one.  This is the patient's preferred pharmacy:     [facility] [id number] - [place], [place] - [address] [place] [address] Phone: [phone] Fax: [phone]    Has the prescription been filled recently? No  Is the patient out of the medication? No  Has the patient been seen for an appointment in the last year OR does the patient have an upcoming appointment? Yes  Can we respond through MyChart? Yes  Agent: Please be advised that Rx refills may take up to 3 business days. We ask that you follow-up with your pharmacy.

## 2023-09-05 MED ORDER — TRAMADOL HCL 50 MG PO TABS: ORAL_TABLET | ORAL | 0 refills | Status: DC

## 2023-09-09 ENCOUNTER — Other Ambulatory Visit: Payer: Self-pay | Admitting: Family Medicine

## 2023-09-09 DIAGNOSIS — N400 Enlarged prostate without lower urinary tract symptoms: Secondary | ICD-10-CM

## 2023-09-10 ENCOUNTER — Encounter (INDEPENDENT_AMBULATORY_CARE_PROVIDER_SITE_OTHER): Payer: Medicare Other | Admitting: Ophthalmology

## 2023-09-11 ENCOUNTER — Ambulatory Visit (HOSPITAL_COMMUNITY): Payer: Self-pay | Admitting: Pharmacist

## 2023-09-11 ENCOUNTER — Telehealth: Payer: Self-pay | Admitting: *Deleted

## 2023-09-11 LAB — POCT INR: INR: 2.6 (ref 2.0–3.0)

## 2023-09-11 NOTE — Telephone Encounter (Signed)
 CALLED PATIENT TO INFORM OF CT FOR 01-08-24- ARRIVAL TIME- 10:15 AM @ [facility], NO RESTRICTIONS TO SCAN, PATIENT TO RECEIVE RESULTS FROM DR. KINARD ON 01-15-24 @ 8 AM, SPOKE WITH PATIENT AND HE IS AWARE OF THESE APPTS. AND THE INSTRUCTIONS

## 2023-09-13 ENCOUNTER — Other Ambulatory Visit (HOSPITAL_COMMUNITY): Payer: Self-pay | Admitting: Cardiology

## 2023-09-13 ENCOUNTER — Other Ambulatory Visit (HOSPITAL_COMMUNITY): Payer: Self-pay

## 2023-09-15 ENCOUNTER — Ambulatory Visit (INDEPENDENT_AMBULATORY_CARE_PROVIDER_SITE_OTHER): Admitting: Family Medicine

## 2023-09-15 ENCOUNTER — Encounter: Payer: Self-pay | Admitting: Family Medicine

## 2023-09-15 VITALS — BP 128/74 | HR 68 | Temp 98.1°F | Ht 73.0 in | Wt 214.6 lb

## 2023-09-15 DIAGNOSIS — N400 Enlarged prostate without lower urinary tract symptoms: Secondary | ICD-10-CM

## 2023-09-15 DIAGNOSIS — I700 Atherosclerosis of aorta: Secondary | ICD-10-CM

## 2023-09-15 DIAGNOSIS — I5022 Chronic systolic (congestive) heart failure: Secondary | ICD-10-CM

## 2023-09-15 DIAGNOSIS — I10 Essential (primary) hypertension: Secondary | ICD-10-CM | POA: Diagnosis not present

## 2023-09-15 DIAGNOSIS — Z95811 Presence of heart assist device: Secondary | ICD-10-CM

## 2023-09-15 DIAGNOSIS — M456 Ankylosing spondylitis lumbar region: Secondary | ICD-10-CM | POA: Diagnosis not present

## 2023-09-15 DIAGNOSIS — Z79899 Other long term (current) drug therapy: Secondary | ICD-10-CM

## 2023-09-15 DIAGNOSIS — C3412 Malignant neoplasm of upper lobe, left bronchus or lung: Secondary | ICD-10-CM

## 2023-09-15 MED ORDER — TAMSULOSIN HCL 0.4 MG PO CAPS: 0.4000 mg | ORAL_CAPSULE | Freq: Every day | ORAL | 3 refills | Status: AC

## 2023-09-15 MED ORDER — TRAMADOL HCL 50 MG PO TABS: ORAL_TABLET | ORAL | 0 refills | Status: DC

## 2023-09-15 NOTE — Progress Notes (Signed)
 Established Patient Office Visit   Subjective  Patient ID: Brandon [name], male    DOB: 1955-12-24  Age: 69 y.o. MRN: [id number]  Chief Complaint  Patient presents with   Medical Management of Chronic Issues    Medication refill     Pt is a 68 yo male seen for f/u and med refill.  Today is patient's 68th birthday.  Patient states he is doing well and feels good overall.  Exercising daily at [employer] well.  Typically in the top 5 people for most visits to the gym.  Patient has also started speaking about his experience with LVAD to others.  Patient requesting refill on Flomax  and refill on tramadol .  Patient using tramadol  sparingly.  May take med at night.  Back pain/joint pain typically feels better once up and moving.  Requesting prescription be written for 120 tabs as done in the past.  Also requesting prescription be sent to [facility] pharmacy as he may be able to get the medication cheaper.    Patient Active Problem List   Diagnosis Date Noted   Melena 02/06/2023   Symptomatic anemia 02/05/2023   Aortic atherosclerosis (HCC) 05/15/2022   S/P AVR (aortic valve replacement) 05/12/2022   History of endocarditis 05/12/2022   COVID-19 virus infection 05/12/2022   Lung nodule 05/12/2022   Aortic valve stenosis 05/07/2022   Dissection of aorta (HCC) 05/06/2022   Acute ST elevation myocardial infarction (STEMI) due to occlusion of left anterior descending (LAD) coronary artery (HCC) 05/06/2022   VF (ventricular fibrillation) (HCC) 04/22/2022   Long term (current) use of anticoagulants 05/15/2021   Presence of left ventricular assist device (LVAD) (HCC) 04/06/2021   Protein-calorie malnutrition, severe 03/14/2021   ILD (interstitial lung disease) (HCC)    Ankylosing spondylitis (HCC)    Chronic systolic CHF (congestive heart failure) (HCC) 03/07/2021   Acute on chronic systolic (congestive) heart failure (HCC) 01/08/2021   Dental caries    Malnutrition of moderate degree 12/01/2020    Pressure injury of skin 12/01/2020   Cholelithiases    Aortic valve endocarditis    Elevated brain natriuretic peptide (BNP) level 11/30/2020   Elevated troponin 11/30/2020   Hypokalemia 11/30/2020   Hypomagnesemia 11/30/2020   Normocytic anemia 11/30/2020   Dyspnea    Infected defibrillator (HCC)    Bacteremia    Central line infection    Acute respiratory failure with hypoxia and hypercapnia (HCC) 11/29/2020   Centrilobular emphysema (HCC) 08/09/2020   Benign neoplasm of colon    Heart failure (HCC) 12/31/2019   Ischemic cardiomyopathy 08/31/2019   STEMI (ST elevation myocardial infarction) (HCC) 04/27/2019   Acute ST elevation myocardial infarction (STEMI) of anterior wall (HCC) 04/27/2019   Mild aortic stenosis 12/02/2012   Ankylosis of sacroiliac joint 03/08/2011   TOBACCO ABUSE 03/13/2009   Hyperlipidemia with target LDL less than 70 06/07/2008   Essential hypertension 06/07/2008   CAD (coronary artery disease) 06/07/2008   BENIGN PROSTATIC HYPERTROPHY 06/07/2008   History of colon polyps 06/07/2008   NEPHROLITHIASIS, HX OF 06/07/2008   Past Medical History:  Diagnosis Date   AICD (automatic cardioverter/defibrillator) present 08/31/2019   Ankylosing spondylitis (HCC)    Arthritis    BENIGN PROSTATIC HYPERTROPHY 06/07/2008   CHF (congestive heart failure) (HCC)    COLONIC POLYPS, HX OF 06/07/2008   Coronary artery disease    Depression    H/O hiatal hernia    Heart failure (HCC)    History of radiation therapy    Left Lung-  08/06/22-08/16/22- Dr. Retta Caster   HYPERLIPIDEMIA 06/07/2008   HYPERTENSION 06/07/2008   Myocardial infarction [facility]) 2005   NSTEMI, s/p LAD stent   NEPHROLITHIASIS, HX OF 06/07/2008   STEMI (ST elevation myocardial infarction) (HCC) 04/27/2019   Past Surgical History:  Procedure Laterality Date   AORTIC VALVE REPLACEMENT N/A 04/06/2021   Procedure: AORTIC VALVE REPLACEMENT (AVR) WITH INSPIRIS RESILIA  AORTIC VALVE SIZE ;   Surgeon: Bartley Lightning, MD;  Location: [facility] OR;  Service: Open Heart Surgery;  Laterality: N/A;   COLONOSCOPY WITH PROPOFOL  N/A 04/24/2020   Procedure: COLONOSCOPY WITH PROPOFOL ;  Surgeon: Ace Holder, MD;  Location: [facility] ENDOSCOPY;  Service: Gastroenterology;  Laterality: N/A;   CORONARY ANGIOPLASTY WITH STENT PLACEMENT  2005   LAD stent, jailed diagonal   CORONARY/GRAFT ACUTE MI REVASCULARIZATION N/A 04/27/2019   Procedure: Coronary/Graft Acute MI Revascularization;  Surgeon: Lucendia Rusk, MD;  Location: [facility] INVASIVE CV LAB;  Service: Cardiovascular;  Laterality: N/A;   ESOPHAGOGASTRODUODENOSCOPY (EGD) WITH PROPOFOL  N/A 02/06/2023   Procedure: ESOPHAGOGASTRODUODENOSCOPY (EGD) WITH PROPOFOL ;  Surgeon: Daina Drum, MD;  Location: [facility] ENDOSCOPY;  Service: Gastroenterology;  Laterality: N/A;   HOT HEMOSTASIS N/A 02/06/2023   Procedure: HOT HEMOSTASIS (ARGON PLASMA COAGULATION/BICAP);  Surgeon: Daina Drum, MD;  Location: [facility] ENDOSCOPY;  Service: Gastroenterology;  Laterality: N/A;   ICD IMPLANT  08/31/2019   ICD IMPLANT N/A 08/31/2019   Procedure: ICD IMPLANT;  Surgeon: Jolly Needle, MD;  Location: [facility] INVASIVE CV LAB;  Service: Cardiovascular;  Laterality: N/A;   ICD LEAD REMOVAL N/A 12/04/2020   Procedure: ICD SYSTEM EXTRACTION;  Surgeon: Boyce Byes, MD;  Location: [facility] OR;  Service: Cardiovascular;  Laterality: N/A;   INSERTION OF IMPLANTABLE LEFT VENTRICULAR ASSIST DEVICE N/A 04/06/2021   Procedure: INSERTION OF IMPLANTABLE LEFT VENTRICULAR ASSIST DEVICE;  Surgeon: Bartley Lightning, MD;  Location: [facility] OR;  Service: Open Heart Surgery;  Laterality: N/A;  HM3   IR THORACENTESIS ASP PLEURAL SPACE W/IMG GUIDE  04/19/2021   IR THORACENTESIS ASP PLEURAL SPACE W/IMG GUIDE  04/20/2021   IR THORACENTESIS ASP PLEURAL SPACE W/IMG GUIDE  04/23/2021   LAMINECTOMY     lumbar   LEFT HEART CATH AND CORONARY ANGIOGRAPHY N/A 04/27/2019   Procedure: LEFT HEART CATH AND CORONARY ANGIOGRAPHY;   Surgeon: Lucendia Rusk, MD;  Location: [facility] INVASIVE CV LAB;  Service: Cardiovascular;  Laterality: N/A;   LUMBAR FUSION  01/2012   POLYPECTOMY  04/24/2020   Procedure: POLYPECTOMY;  Surgeon: Ace Holder, MD;  Location: [facility] ENDOSCOPY;  Service: Gastroenterology;;   RIGHT HEART CATH N/A 04/27/2019   Procedure: RIGHT HEART CATH;  Surgeon: Swazil[place], Romell M, MD;  Location: [facility] INVASIVE CV LAB;  Service: Cardiovascular;  Laterality: N/A;   RIGHT HEART CATH N/A 03/28/2021   Procedure: RIGHT HEART CATH;  Surgeon: Mardell Shade, MD;  Location: [facility] INVASIVE CV LAB;  Service: Cardiovascular;  Laterality: N/A;   RIGHT HEART CATH AND CORONARY ANGIOGRAPHY N/A 05/06/2022   Procedure: RIGHT HEART CATH AND CORONARY ANGIOGRAPHY;  Surgeon: Mardell Shade, MD;  Location: [facility] INVASIVE CV LAB;  Service: Cardiovascular;  Laterality: N/A;   RIGHT/LEFT HEART CATH AND CORONARY ANGIOGRAPHY N/A 11/29/2020   Procedure: RIGHT/LEFT HEART CATH AND CORONARY ANGIOGRAPHY;  Surgeon: Mardell Shade, MD;  Location: [facility] INVASIVE CV LAB;  Service: Cardiovascular;  Laterality: N/A;   TEE WITHOUT CARDIOVERSION N/A 12/04/2020   Procedure: TRANSESOPHAGEAL ECHOCARDIOGRAM (TEE);  Surgeon: Boyce Byes, MD;  Location: [facility] OR;  Service: Cardiovascular;  Laterality: N/A;   TEE WITHOUT CARDIOVERSION N/A 04/06/2021   Procedure: TRANSESOPHAGEAL ECHOCARDIOGRAM (TEE);  Surgeon: Bartley Lightning, MD;  Location: [facility] OR;  Service: Open Heart Surgery;  Laterality: N/A;   TONSILLECTOMY     warthin tumor removal Left 2014   Social History   Tobacco Use   Smoking status: Former    Current packs/day: 0.00    Average packs/day: 1 pack/day for 40.0 years (40.0 ttl pk-yrs)    Types: Cigarettes    Start date: 07/26/1979    Quit date: 07/26/2019    Years since quitting: 4.1   Smokeless tobacco: Never   Tobacco comments:    1-1.5 pks per year x 40-45 yrs  Vaping Use   Vaping status: Some Days   Substances: CBD  Substance Use  Topics   Alcohol  use: Yes    Alcohol /week: 7.0 standard drinks of alcohol     Types: 7 Shots of liquor per week    Comment: "2 vodka tonics a night"   Drug use: Yes    Comment: occasionally - CBD oil   Family History  Problem Relation Age of Onset   Depression Father    Arthritis Sister        RA   Heart failure Mother    Other Neg Hx    Colon cancer Neg Hx    Esophageal cancer Neg Hx    Rectal cancer Neg Hx    Stomach cancer Neg Hx    Colon polyps Neg Hx    No Known Allergies    ROS Negative unless stated above    Objective:     BP 128/74 (BP Location: Right Arm, Patient Position: Sitting, Cuff Size: Normal)   Pulse 68   Temp 98.1 F (36.7 C) (Oral)   Ht 6\' 1"  (1.854 m)   Wt 214 lb 9.6 oz (97.3 kg)   SpO2 96%   BMI 28.31 kg/m  BP Readings from Last 3 Encounters:  09/15/23 128/74  08/08/23 120/62  08/01/23 112/62   Wt Readings from Last 3 Encounters:  09/15/23 214 lb 9.6 oz (97.3 kg)  08/08/23 213 lb (96.6 kg)  08/01/23 213 lb (96.6 kg)      Physical Exam Constitutional:      General: He is not in acute distress.    Appearance: Normal appearance.  HENT:     Head: Normocephalic and atraumatic.     Nose: Nose normal.     Mouth/Throat:     Mouth: Mucous membranes are moist.  Cardiovascular:     Rate and Rhythm: Normal rate and regular rhythm.     Heart sounds: No murmur heard.    Comments: Hum of LVAD. Pulmonary:     Effort: Pulmonary effort is normal. No respiratory distress.     Breath sounds: Normal breath sounds. No wheezing, rhonchi or rales.  Musculoskeletal:     Right lower leg: No edema.     Left lower leg: No edema.  Skin:    General: Skin is warm and dry.  Neurological:     Mental Status: He is alert and oriented to person, place, and time.        08/08/2023    8:39 AM 09/18/2022    4:03 PM 07/26/2022    9:28 AM  Depression screen PHQ 2/9  Decreased Interest 0 0 0  Down, Depressed, Hopeless 0 0 0  PHQ - 2 Score 0 0 0  Altered  sleeping  0   Tired, decreased energy  1  Change in appetite  0   Feeling bad or failure about yourself   0   Trouble concentrating  0   Moving slowly or fidgety/restless  0   Suicidal thoughts  0   PHQ-9 Score  1   Difficult doing work/chores  Not difficult at all       02/25/2020   11:17 AM  GAD 7 : Generalized Anxiety Score  Nervous, Anxious, on Edge 0  Control/stop worrying 1  Worry too much - different things 0  Trouble relaxing 2  Restless 2  Easily annoyed or irritable 0  Afraid - awful might happen 0  Total GAD 7 Score 5  Anxiety Difficulty Somewhat difficult     No results found for any visits on 09/15/23.    Assessment & Plan:  Essential hypertension  Ankylosing spondylitis of lumbar region (HCC) -     traMADol  HCl; TAKE 1-2 TABLETS BY MOUTH TWICE A DAY FOR 30 DAYS  Dispense: 120 tablet; Refill: 0  Benign prostatic hyperplasia without lower urinary tract symptoms -     Tamsulosin  HCl; Take 1 capsule (0.4 mg total) by mouth daily.  Dispense: 90 capsule; Refill: 3  LVAD (left ventricular assist device) present (HCC)  Medication management  Chronic systolic CHF (congestive heart failure) (HCC)  Bronchogenic carcinoma of upper lobe of left lung (HCC)  Aortic atherosclerosis (HCC)   Patient is doing well.  Chronic conditions stable.  BP well-controlled.  Congratulated on staying active and making lifestyle modifications.  Continue tramadol  50-100 mg twice daily as needed for pain associated with ankylosing spondylitis and primary arthritis.  Continue tamsulosin  for BPH.  Euvolemic on exam.  Stable on LVAD.  Continue current medications and Lasix  as needed.  Continue follow-up with cardiology  CT chest 06/25/2023 with evolving radiation changes in the left upper lobe.  Stable underlying 6 mm left upper lobe nodule.  No evidence of local recurrence or metastatic disease.  New focal consolidation inferiorly in the lingula, not clearly explained is representing  artifact from the nearby LVAD.  If real, this is likely infectious/inflammatory.  Recommend attention on follow-up.  Aortic atherosclerosis and emphysema.  Patient has follow-up CT scheduled for August.  Continue follow-up with rad Cain Castillo, Dr. Luna Salinas for stereotactic radiation without biopsy.  Continue lifestyle modifications.  Continue Lipitor  80 mg and Zetia  10 mg.  Patient wished a happy 43 th birthday.  Return for f/u in 6 mos-1 yr for chronic conditions.  Sooner if needed.Viola Greulich, MD

## 2023-09-18 ENCOUNTER — Ambulatory Visit (HOSPITAL_COMMUNITY): Payer: Self-pay | Admitting: Pharmacist

## 2023-09-18 ENCOUNTER — Other Ambulatory Visit (HOSPITAL_COMMUNITY): Payer: Self-pay | Admitting: *Deleted

## 2023-09-18 DIAGNOSIS — Z95811 Presence of heart assist device: Secondary | ICD-10-CM

## 2023-09-18 DIAGNOSIS — Z7901 Long term (current) use of anticoagulants: Secondary | ICD-10-CM

## 2023-09-18 DIAGNOSIS — I5022 Chronic systolic (congestive) heart failure: Secondary | ICD-10-CM

## 2023-09-18 DIAGNOSIS — D509 Iron deficiency anemia, unspecified: Secondary | ICD-10-CM

## 2023-09-18 LAB — POCT INR: INR: 2.7 (ref 2.0–3.0)

## 2023-09-19 ENCOUNTER — Other Ambulatory Visit (HOSPITAL_COMMUNITY): Payer: Self-pay | Admitting: Family Medicine

## 2023-09-19 ENCOUNTER — Ambulatory Visit (HOSPITAL_COMMUNITY)
Admission: RE | Admit: 2023-09-19 | Discharge: 2023-09-19 | Disposition: A | Discharge status: Home / Self Care | Source: Ambulatory Visit | Attending: Internal Medicine | Admitting: Internal Medicine

## 2023-09-19 ENCOUNTER — Telehealth (HOSPITAL_COMMUNITY): Payer: Self-pay | Admitting: Family Medicine

## 2023-09-19 ENCOUNTER — Encounter (HOSPITAL_COMMUNITY): Payer: Self-pay | Admitting: Internal Medicine

## 2023-09-19 VITALS — BP 108/0 | HR 70 | Wt 213.4 lb

## 2023-09-19 DIAGNOSIS — Z952 Presence of prosthetic heart valve: Secondary | ICD-10-CM | POA: Diagnosis not present

## 2023-09-19 DIAGNOSIS — Z95811 Presence of heart assist device: Secondary | ICD-10-CM | POA: Insufficient documentation

## 2023-09-19 DIAGNOSIS — I5022 Chronic systolic (congestive) heart failure: Secondary | ICD-10-CM | POA: Insufficient documentation

## 2023-09-19 DIAGNOSIS — C3412 Malignant neoplasm of upper lobe, left bronchus or lung: Secondary | ICD-10-CM | POA: Diagnosis not present

## 2023-09-19 DIAGNOSIS — I2510 Atherosclerotic heart disease of native coronary artery without angina pectoris: Secondary | ICD-10-CM | POA: Diagnosis not present

## 2023-09-19 DIAGNOSIS — I472 Ventricular tachycardia, unspecified: Secondary | ICD-10-CM | POA: Diagnosis not present

## 2023-09-19 DIAGNOSIS — D500 Iron deficiency anemia secondary to blood loss (chronic): Secondary | ICD-10-CM

## 2023-09-19 DIAGNOSIS — D509 Iron deficiency anemia, unspecified: Secondary | ICD-10-CM | POA: Diagnosis not present

## 2023-09-19 DIAGNOSIS — Z79899 Other long term (current) drug therapy: Secondary | ICD-10-CM | POA: Diagnosis not present

## 2023-09-19 DIAGNOSIS — Z4509 Encounter for adjustment and management of other cardiac device: Secondary | ICD-10-CM | POA: Insufficient documentation

## 2023-09-19 DIAGNOSIS — Z955 Presence of coronary angioplasty implant and graft: Secondary | ICD-10-CM | POA: Diagnosis not present

## 2023-09-19 DIAGNOSIS — Z953 Presence of xenogenic heart valve: Secondary | ICD-10-CM | POA: Diagnosis not present

## 2023-09-19 DIAGNOSIS — I252 Old myocardial infarction: Secondary | ICD-10-CM | POA: Diagnosis not present

## 2023-09-19 DIAGNOSIS — Z7901 Long term (current) use of anticoagulants: Secondary | ICD-10-CM | POA: Diagnosis not present

## 2023-09-19 DIAGNOSIS — I255 Ischemic cardiomyopathy: Secondary | ICD-10-CM | POA: Diagnosis not present

## 2023-09-19 DIAGNOSIS — D649 Anemia, unspecified: Secondary | ICD-10-CM

## 2023-09-19 DIAGNOSIS — Z923 Personal history of irradiation: Secondary | ICD-10-CM | POA: Diagnosis not present

## 2023-09-19 DIAGNOSIS — I7121 Aneurysm of the ascending aorta, without rupture: Secondary | ICD-10-CM | POA: Diagnosis not present

## 2023-09-19 DIAGNOSIS — Z7982 Long term (current) use of aspirin: Secondary | ICD-10-CM | POA: Diagnosis not present

## 2023-09-19 DIAGNOSIS — I480 Paroxysmal atrial fibrillation: Secondary | ICD-10-CM | POA: Insufficient documentation

## 2023-09-19 LAB — CBC
HCT: 44.4 % (ref 39.0–52.0)
Hemoglobin: 14.6 g/dL (ref 13.0–17.0)
MCH: 28.7 pg (ref 26.0–34.0)
MCHC: 32.9 g/dL (ref 30.0–36.0)
MCV: 87.2 fL (ref 80.0–100.0)
Platelets: 151 10*3/uL (ref 150–400)
RBC: 5.09 MIL/uL (ref 4.22–5.81)
RDW: 15.8 % — ABNORMAL HIGH (ref 11.5–15.5)
WBC: 7.8 10*3/uL (ref 4.0–10.5)
nRBC: 0 % (ref 0.0–0.2)

## 2023-09-19 LAB — IRON AND TIBC
Iron: 43 ug/dL — ABNORMAL LOW (ref 45–182)
Saturation Ratios: 14 % — ABNORMAL LOW (ref 17.9–39.5)
TIBC: 302 ug/dL (ref 250–450)
UIBC: 259 ug/dL

## 2023-09-19 LAB — COMPREHENSIVE METABOLIC PANEL WITH GFR
ALT: 20 U/L (ref 0–44)
AST: 24 U/L (ref 15–41)
Albumin: 3.8 g/dL (ref 3.5–5.0)
Alkaline Phosphatase: 104 U/L (ref 38–126)
Anion gap: 7 (ref 5–15)
BUN: 16 mg/dL (ref 8–23)
CO2: 27 mmol/L (ref 22–32)
Calcium: 9.2 mg/dL (ref 8.9–10.3)
Chloride: 103 mmol/L (ref 98–111)
Creatinine, Ser: 1.06 mg/dL (ref 0.61–1.24)
GFR, Estimated: >60 mL/min (ref >60)
Glucose, Bld: 141 mg/dL — ABNORMAL HIGH (ref 70–99)
Potassium: 4.6 mmol/L (ref 3.5–5.1)
Sodium: 137 mmol/L (ref 135–145)
Total Bilirubin: 0.8 mg/dL (ref 0.0–1.2)
Total Protein: 7.2 g/dL (ref 6.5–8.1)

## 2023-09-19 LAB — FOLATE: Folate: 9.9 ng/mL (ref >5.9)

## 2023-09-19 LAB — PREALBUMIN: Prealbumin: 22 mg/dL (ref 18–38)

## 2023-09-19 LAB — PROTIME-INR
INR: 2.9 — ABNORMAL HIGH (ref 0.8–1.2)
Prothrombin Time: 30.8 s — ABNORMAL HIGH (ref 11.4–15.2)

## 2023-09-19 LAB — LACTATE DEHYDROGENASE: LDH: 154 U/L (ref 98–192)

## 2023-09-19 LAB — FERRITIN: Ferritin: 112 ng/mL (ref 24–336)

## 2023-09-19 LAB — MAGNESIUM: Magnesium: 1.9 mg/dL (ref 1.7–2.4)

## 2023-09-19 LAB — VITAMIN B12: Vitamin B-12: 259 pg/mL (ref 180–914)

## 2023-09-19 NOTE — Telephone Encounter (Signed)
 Patient referred to infusion pharmacy team for ambulatory infusion of IV iron.  Insurance - Medicare  Dx code - D50.0/D50.22  IV Iron Therapy - Monoferric 1000 mg IV x 1  Infusion appointment will be scheduled for [facility].  Kariem Wolfson D. Shady Bradish, PharmD

## 2023-09-19 NOTE — Patient Instructions (Addendum)
 No medication changes today Coumadin  dosing per Lauren PharmD Return to [facility] Clinic in two months  Referral sent to infusion therapy for anemia panel results today

## 2023-09-19 NOTE — Progress Notes (Addendum)
 Patient presents to [facility] for 2 month f/u with 2.5 year Intermacs in VAD clinic alone. Reports no problems with VAD equipment or concerns with drive line.  Pt walked into clinic today. States he has been doing great. Celebrated his 68th birthday this week! Denies lightheadedness, dizziness, shortness of breath, heart failure symptoms, and signs of bleeding.   Has lung surveillance CT chest scheduled in August.   Referral sent to infusion therapy team per anemia panel results today. Pt aware to expect a phone call from their team.   Vital Signs:    Doppler Pressure: 108 Automatic BP: 118/82 (92) HR: 70 SPO2: 97% on RA   Weight: 213.4 w/ ept Last weight: 211.4 b w/ eqt Ht: 6'   VAD Interrogation: Speed: 5800 Flow: 4.8 Power: 4.7w    PI: 4.5 Alarms: none Events: > 100 PI events today  Fixed speed: 5800 Low speed limit: 5500  Primary controller: Back up battery due for replacement in 21 months Secondary controller: Expired. Replaced with [id number] Manufacture: 06/16/23 Exp: 05/15/25. Back up battery due for replacement in 6 months    I reviewed the LVAD parameters from today and compared the results to the patient's prior recorded data. LVAD interrogation was NEGATIVE for significant power changes, NEGATIVE for clinical alarms and STABLE for PI events/speed drops. No programming changes were made and pump is functioning within specified parameters. Pt is performing daily controller and system monitor self tests along with completing weekly and monthly maintenance for LVAD equipment.   LVAD equipment check completed and is in good working order. Back-up equipment  present and charged at today's visit.   Annual Equipment Maintenance on UBC/PM was performed today 03/11/23.    Exit Site Care:  Existing VAD dressing dry and intact. Anchor intact and applied accurately. Pt denies any redness, tenderness, drainage, or pain at exit site. Neighbor continues to perform weekly dressing  changes. Provided patient with 8 weekly dressing kits.     Device: N/A  (removed due to infection 12/04/20)  BP & Labs:  Doppler 108 - Doppler is reflecting modified systolic   Hgb 14.6 - No S/S of bleeding. Specifically denies melena/BRBPR or nosebleeds.   LDH 154 with established baseline of 135 - 309. Denies tea-colored urine. No power elevations noted on interrogation.   2.5 year Intermacs follow up completed including:  Quality of Life, KCCQ-12, and Neurocognitive trail making.   Pt completed 900 feet during 6 minute walk. (Limited by bilateral hip pain)  Back up controller: 11V backup battery charged during this visit.  Patient Goals: To maintain how well he is doing  [place] Cardiomyopathy Questionnaire     09/19/2023   10:33 AM 03/11/2023    1:40 PM 10/10/2022    3:48 PM  KCCQ-12  1 a. Ability to shower/bathe Not at all limited Not at all limited Not at all limited  1 b. Ability to walk 1 block Slightly limited Slightly limited Slightly limited  1 c. Ability to hurry/jog Moderately limited Quite a bit limited Quite a bit limited  2. Edema feet/ankles/legs Never over the past 2 weeks Never over the past 2 weeks Never over the past 2 weeks  3. Limited by fatigue 1-2 times a week Less than once a week 1-2 times a week  4. Limited by dyspnea  Less than once a week 1-2 times a week  5. Sitting up / on 3+ pillows Less than once a week Never over the past 2 weeks Never over the  past 2 weeks  6. Limited enjoyment of life Not limited at all Not limited at all Not limited at all  7. Rest of life w/ symptoms Mostly satisfied Mostly satisfied Mostly satisfied  8 a. Participation in hobbies Did not limit at all Did not limit at all Slightly limited  8 b. Participation in chores Did not limit at all Did not limit at all Did not limit at all  8 c. Visiting family/friends Did not limit at all Did not limit at all Did not limit at all     Patient Instructions:  No medication  changes today Coumadin  dosing per Lauren PharmD Return to [facility] Clinic in two months  Referral sent to infusion therapy for anemia panel results today  Paulo Bosworth RN VAD Coordinator  Office: [phone]  24/7 Pager: [phone]

## 2023-09-21 NOTE — Progress Notes (Addendum)
 LVAD Follow-up Clinic Note   PCP: Brandon Greulich, MD HF MD: Brandon [name]   HPI:  Brandon [name] is a69 year old male with history of tobacco use, CAD s/p previous anterior MI, severe systolic HF s/p HM-3 VAD 04/06/21, severe AS s/p AVR, endocarditis.    Admitted 12/20 with anterior ST elevation MI. Cath with chronically occluded RCA (L>>R collaterals) and thrombotic occlusion of proximal LAD treated with PCI. Echo w/ reduced LVEF 30-35% w/ apical aneurysm. RV ok. Post cath, he required milrinone  for cardiogenic shock. Subsequently underwent Barostim.   Underwent outpatient [facility] 11/29/20 as part of VAD w/u. Post procedure, developed severe rigors and fever. Admitted to ICU Blood Cx + strep. 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  Likely low-flow low gradient AS. Underwent ICD extraction..    Admitted for cardiogenic shock in 10/22.  He underwent placement of HM3 VAD and aortic valve replacement with 25 mm Edwards Inspiris Resilia valve on 04/06/21. Surgical path came back 11/28 with evidence of possible "acute aortic valve endocarditis". 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 Discharged 05/09/21.   He had a complicated month of December.  On 04/22/2022 he presented with shortness of breath and diaphoresis.  Found to be in ventricular fibrillation.  Was defibrillated.  Then on Christmas day he presented back with chest pain, nausea, and shortness of breath.  ECG showed ST elevation.  Catheterization showed stable CAD. Cardiac CT suspicious for clot on AoV. INR increased to 2.5-3.0 range.   Had PET 1/24 which showed increased size of the spiculated LUL pulmonary nodule which is hypermetabolic concerning for primary bronchogenic carcinoma. Has seen Dr. Luna [name] and referred  for stereotactic radiation without biopsy.   Admitted on 02/05/23 with anemia hgb was 10.3. INR 2.0. He was given a bolus of IVFs. Protonix  increased to 40 mg bid. GI consulted. On 9/26, underwent EGD showing 1 duodenal AVM which was treated APC. During admission, episode of hypotension requiring additional 500cc bolus IVF. Epleronone decreases to 25mg  daily at discharge. Intermittent runs of VT    Follow up for Heart Failure/LVAD: Doing great continues to go to the gym regularly. No CP or SOB. Just celebrated his 68th bday. Says he feels best he had in long time. Denies orthopnea or PND. No fevers, chills or problems with driveline. No bleeding, melena or neuro symptoms. No VAD alarms. Taking all meds as prescribed.     VAD Interrogation: Speed: 5800 Flow: 4.8 Power: 4.7w    PI: 4.5 Alarms: none Events: > 100 PI events today  Fixed speed: 5800 Low speed limit: 5500   Primary controller: Back up battery due for replacement in 21 months Secondary controller: Expired. Replaced with [id number] Manufacture: 06/16/23 Exp: 05/15/25. Back up battery due for replacement in 6 months    I reviewed the LVAD parameters from today and compared the results to the patient's prior recorded data. LVAD interrogation was NEGATIVE for significant power changes, NEGATIVE for clinical alarms and STABLE for PI events/speed drops. No programming changes were made and pump is functioning within specified parameters. Pt is performing daily controller and system monitor self tests along with completing weekly and monthly maintenance for LVAD equipment.   LVAD equipment check completed and is in good working order. Back-up equipment  present and charged at today's visit.   Annual  Equipment Maintenance on UBC/PM was performed today 03/11/23.  Past Medical History:  Diagnosis Date   AICD (automatic cardioverter/defibrillator) present 08/31/2019   Ankylosing spondylitis (HCC)    Arthritis    BENIGN PROSTATIC HYPERTROPHY  06/07/2008   CHF (congestive heart failure) (HCC)    COLONIC POLYPS, HX OF 06/07/2008   Coronary artery disease    Depression    H/O hiatal hernia    Heart failure (HCC)    History of radiation therapy    Left Lung- 08/06/22-08/16/22- Dr. Retta [name]   HYPERLIPIDEMIA 06/07/2008   HYPERTENSION 06/07/2008   Myocardial infarction [facility]) 2005   NSTEMI, s/p LAD stent   NEPHROLITHIASIS, HX OF 06/07/2008   STEMI (ST elevation myocardial infarction) (HCC) 04/27/2019    Current Outpatient Medications  Medication Sig Dispense Refill   acetaminophen  (TYLENOL ) 500 MG tablet Take 500-1,000 mg by mouth every 6 (six) hours as needed (for pain).     atorvastatin  (LIPITOR ) 80 MG tablet TAKE 1 TABLET BY MOUTH ONCE DAILY AT 6PM. 30 tablet 0   Carboxymethylcellulose Sodium (ARTIFICIAL TEARS OP) Place 1 drop into both eyes daily as needed (for dryness or irritation).     diphenhydrAMINE  (BENADRYL ) 25 mg capsule Take 25 mg by mouth every 8 (eight) hours as needed.     dorzolamide (TRUSOPT) 2 % ophthalmic solution Place 1 drop into the left eye 3 (three) times daily.     eplerenone  (INSPRA ) 25 MG tablet Take 2 tablets (50 mg total) by mouth in the morning. 30 tablet 5   ezetimibe  (ZETIA ) 10 MG tablet TAKE 1 TABLET BY MOUTH DAILY 90 tablet 3   gabapentin  (NEURONTIN ) 300 MG capsule Take 1 capsule (300 mg total) by mouth 3 (three) times daily. 90 capsule 3   ketorolac (ACULAR) 0.5 % ophthalmic solution Place 1 drop into the left eye 4 (four) times daily.     metoprolol  succinate (TOPROL -XL) 25 MG 24 hr tablet Take 1 tablet (25 mg total) by mouth daily. 90 tablet 3   pantoprazole  (PROTONIX ) 40 MG tablet TAKE 1 TABLET BY MOUTH TWICE A DAY 180 tablet 2   potassium chloride  SA (KLOR-CON  M20) 20 MEQ tablet Take 1 tablet as needed when taking Torsemide      sertraline  (ZOLOFT ) 50 MG tablet Take 1 tablet (50 mg total) by mouth daily. Pt taking 50 mg daily 90 tablet 3   tamsulosin  (FLOMAX ) 0.4 MG CAPS capsule Take 1  capsule (0.4 mg total) by mouth daily. 90 capsule 3   torsemide  (DEMADEX ) 20 MG tablet TAKE 1 TABLET BY MOUTH AS NEEDED 90 tablet 1   traMADol  (ULTRAM ) 50 MG tablet TAKE 1-2 TABLETS BY MOUTH TWICE A DAY FOR 30 DAYS 120 tablet 0   warfarin (COUMADIN ) 2.5 MG tablet TAKE 7.5 MG (3 TABLETS) EVERY MONDAY/FRIDAY AND 5 MG (2 TABLETS) ALL OTHER DAYS OR AS INSTRUCTED BY LVAD CLINIC. (Patient taking differently: Take two of the 2.5 mg tablets for total of 5mg  on Mondays and all other days take 3 of the 2.5 mg tablets for total of 7.5mg  per patient) 180 tablet 11   ZYRTEC ALLERGY 10 MG tablet Take 10 mg by mouth daily.     amoxicillin  (AMOXIL ) 500 MG capsule TAKE 4 CAPSULES (2,000 MG TOTAL) BY MOUTH AS NEEDED. 30-60 MINUTES BEFORE DENTAL PROCEDURE (Patient not taking: Reported on 09/19/2023) 4 capsule 3   nystatin  (MYCOSTATIN /NYSTOP ) powder Apply 1 Application topically 3 (three) times daily. (Patient not taking: Reported on 09/19/2023) 15 g 2   No current  facility-administered medications for this encounter.    Patient has no known allergies.   Vital Signs:     Doppler Pressure: 108 Automatic BP: 118/82 (92) HR: 70 SPO2: 97% on RA   Weight: 213.4 w/ ept Last weight: 211.4 b w/ eqt Ht: 6'    Physical Exam: General:  NAD.  HEENT: normal  Neck: supple. JVP not elevated.  Carotids 2+ bilat; no bruits. No lymphadenopathy or thryomegaly appreciated. Cor: LVAD hum.  Lungs: Clear. Abdomen:  soft, nontender, non-distended. No hepatosplenomegaly. No bruits or masses. Good bowel sounds. Driveline site clean. Anchor in place.  Extremities: no cyanosis, clubbing, rash. Warm no edema  Neuro: alert & oriented x 3. No focal deficits. Moves all 4 without problem    ASSESSMENT AND PLAN:  1. Chronic systolic HFr EF due to iCM - Echo 05/19/20 EF 20-25% RV mildly HK. moderate AS  Mean gradient 13 AVA 1.2 cm2 DI 0.30 - s/p HM-III VAD + bioprosthetic AVR 04/06/21 - Episode of cardiogenic shock and anterior ST  elevation 12/23. Cath with patent LAD stent. Cardiac CT suggestive of clot on aortic valve. Felt to be possible embolic event  - Doing very well NYHA I with VAD support - Volume ok. Has PRN torsemide  as needed but careful in setting ofr frequent PI events - Continue Toprol  12.5 daily - Continue eplerenone  50 mg daily   2. HM-3 LVAD implant - VAD interrogated personally. Parameters stable.. - Ramp echo 12/30/22: LVEF < 20% LVIDd 6.1cm. Cannula well placed. RV mildly HK. AoV not opening. No AI. Septum midline at 5800. No change in speed - ASA resumed due to previous thrombotic episode off AoV - INR 2.9 Goal 2.5-3.0 due to clotting on AoV and possible coronary embolism 12/23 Discussed warfarin dosing with PharmD personally. - LDH 259 - Hgb 14.6 (improved after IV iron) - MAPs ok - DL looks good  3. VT/VF 12/23.  - No recent events   4. Acute upper GI bleed 9/24 with iron-deficiency anemia - 02/06/23 underwent EGD showing 1 duodenal AVM which was treated APC - Hgb improved wit IV iron in 12/24. - Continue to follow  5. Severe low-flow aortic stenosis s/p AVR - s/p VAD/bioprosthetic AVR - No change   6. CAD  - s/p anterior STEMI (12/20). LHC showed chronically occluded RCA (with L>>R collaterals) and thrombotic occlusion of proximal LAD. Underwent PCI of LAD. - Now s/p VAD - Recurrent anterior STEMI in 12/23 Cath 12/23 LAD stent patent. Felt to be embolic from aortic valve - Continue atorvastatin  80.  - Continue ASA 81 with previous thrombotic episode off AoV - No s/s angina  Continue exercise program    7. Paroxysmal Atrial Fibrillation w/ RVR - Remains in NSR. Off amio  8. LUL Bronchogenic CA - h/o tobacco use - Has seen Dr. Luna [name].  - s/p XRT 5/24 - continue to follow with oncology - due for surveillance CT  9. Ascending aortic aneurysm - 4.1 cm on CT 8/24. Will followy - no changes  . I spent a total of 41 minutes today: 1) reviewing the patient's medical records  including previous charts, labs and recent notes from other providers; 2) examining the patient and counseling them on their medical issues/explaining the plan of care; 3) adjusting meds as needed and 4) ordering lab work or other needed tests.     Jules Oar, MD  11:04 AM

## 2023-09-22 ENCOUNTER — Telehealth: Payer: Self-pay | Admitting: Pharmacy Technician

## 2023-09-22 NOTE — Telephone Encounter (Signed)
 Auth Submission: NO AUTH NEEDED Site of care: Site of care: [facility] Payer: medicare a/b Medication & CPT/J Code(s) submitted: Monoferric ([employer] derisomaltose) [phone] Route of submission (phone, fax, portal):  Phone # Fax # Auth type: Buy/Bill PB Units/visits requested: x1 monoferric Reference number:  Approval from: 09/22/23 to 01/23/24

## 2023-09-25 ENCOUNTER — Ambulatory Visit (HOSPITAL_COMMUNITY): Payer: Self-pay | Admitting: Pharmacist

## 2023-09-25 LAB — POCT INR: INR: 2.3 (ref 2.0–3.0)

## 2023-09-26 ENCOUNTER — Ambulatory Visit (HOSPITAL_COMMUNITY)
Admission: RE | Admit: 2023-09-26 | Discharge: 2023-09-26 | Disposition: A | Discharge status: Home / Self Care | Source: Ambulatory Visit | Attending: Family Medicine | Admitting: Family Medicine

## 2023-09-26 DIAGNOSIS — D649 Anemia, unspecified: Secondary | ICD-10-CM | POA: Diagnosis present

## 2023-09-26 DIAGNOSIS — I5022 Chronic systolic (congestive) heart failure: Secondary | ICD-10-CM

## 2023-09-26 MED ORDER — SODIUM CHLORIDE 0.9 % IV SOLN
1000.0000 mg | Freq: Once | INTRAVENOUS | Status: AC
  Administered 2023-09-26: 1000 mg via INTRAVENOUS
  Filled 2023-09-26: qty 10

## 2023-10-02 ENCOUNTER — Ambulatory Visit (HOSPITAL_COMMUNITY): Payer: Self-pay | Admitting: Pharmacist

## 2023-10-02 LAB — POCT INR: INR: 1.7 — AB (ref 2.0–3.0)

## 2023-10-06 NOTE — Progress Notes (Unsigned)
  Cardiology Office Note:   Date:  10/07/2023  ID:  GARWOOD WENTZELL, DOB 1955-11-09, MRN [id number]  Primary Cardiologist: Alexandria Angel, MD Electrophysiologist: Boyce Byes, MD   History of Present Illness:   Brandon [name] is a 68 y.o. male with h/o chronic systolic CHF s/p LVAD, ischemic CMP, Mod/Sev AS s/p AVR, HTN, and h/o endocarditis seen today for routine electrophysiology followup.   Since last being seen in our clinic the patient reports doing very well s/p LVAD. Working out multiple days a week without issues. Currently, he denies chest pain, palpitations, dyspnea, PND, orthopnea, nausea, vomiting, dizziness, syncope, edema, weight gain, or early satiety.     Review of systems complete and found to be negative unless listed in HPI.    EP Information / Studies Reviewed:    EKG is not ordered today. EKG from 04/08/2023 reviewed which showed NSR at 64 with barostim interference  Arrhythmia/Device History HM III LVAD pt- Dr Julane Ny VAD pager: [phone]   [facility]- [phone]   Barostim Interrogation- Performed personally and reviewed in detail today,  See scanned report  Physical Exam:   VS:  Pulse 65   Wt 210 lb 6.4 oz (95.4 kg)   SpO2 98%   BMI 27.76 kg/m    Wt Readings from Last 3 Encounters:  10/07/23 210 lb 6.4 oz (95.4 kg)  09/26/23 216 lb (98 kg)  09/19/23 213 lb 6.4 oz (96.8 kg)     GEN: Well nourished, well developed in no acute distress NECK: No JVD; No carotid bruits CARDIAC: + LVAD hum RESPIRATORY:  Clear to auscultation without rales, wheezing or rhonchi  ABDOMEN: Soft, non-tender, non-distended EXTREMITIES:  No edema; No deformity   ASSESSMENT AND PLAN:    Chronic systolic CHF s/p Abbott Defibrillator and Barostim implantation NYHA II-III symptoms. S/p LVAD  Device programmed at chronic settings  Device impedence stable. Normal device function Barostom impedance stable. Battery until 08/2024 Will need to discuss as we near that  time if gen change would be warranted in LVAD pt. Will also discuss with [facility].   CAD Denies s/s ischemia  PAF Off amiodarone  On coumadin  for LVAD   Disposition:   Follow up with EP APP in in 6 months  Signed, Tylene Galla, PA-C

## 2023-10-07 ENCOUNTER — Ambulatory Visit: Attending: Student | Admitting: Student

## 2023-10-07 ENCOUNTER — Encounter: Payer: Self-pay | Admitting: Student

## 2023-10-07 VITALS — HR 65 | Wt 210.4 lb

## 2023-10-07 DIAGNOSIS — I5022 Chronic systolic (congestive) heart failure: Secondary | ICD-10-CM

## 2023-10-07 DIAGNOSIS — I480 Paroxysmal atrial fibrillation: Secondary | ICD-10-CM | POA: Diagnosis present

## 2023-10-07 DIAGNOSIS — Z952 Presence of prosthetic heart valve: Secondary | ICD-10-CM

## 2023-10-07 DIAGNOSIS — Z7901 Long term (current) use of anticoagulants: Secondary | ICD-10-CM

## 2023-10-07 DIAGNOSIS — I2510 Atherosclerotic heart disease of native coronary artery without angina pectoris: Secondary | ICD-10-CM | POA: Diagnosis present

## 2023-10-07 DIAGNOSIS — Z95811 Presence of heart assist device: Secondary | ICD-10-CM

## 2023-10-07 NOTE — Patient Instructions (Signed)
 Medication Instructions:  No medication changes today. *If you need a refill on your cardiac medications before your next appointment, please call your pharmacy*  Lab Work: No labwork ordered today. If you have labs (blood work) drawn today and your tests are completely normal, you will receive your results only by: MyChart Message (if you have MyChart) OR A paper copy in the mail If you have any lab test that is abnormal or we need to change your treatment, we will call you to review the results.  Testing/Procedures: No testing ordered today  Follow-Up: At [facility], you and your health needs are our priority.  As part of our continuing mission to provide you with exceptional heart care, our providers are all part of one team.  This team includes your primary Cardiologist (physician) and Advanced Practice Providers or APPs (Physician Assistants and Nurse Practitioners) who all work together to provide you with the care you need, when you need it.  Your next appointment:   6 month(s)  Provider:    Joycelyn Noa" Percell Boyers, PA-C   We recommend signing up for the patient portal called "MyChart".  Sign up information is provided on this After Visit Summary.  MyChart is used to connect with patients for Virtual Visits (Telemedicine).  Patients are able to view lab/test results, encounter notes, upcoming appointments, etc.  Non-urgent messages can be sent to your provider as well.   To learn more about what you can do with MyChart, go to [email or web address].

## 2023-10-08 ENCOUNTER — Other Ambulatory Visit (HOSPITAL_COMMUNITY): Payer: Self-pay | Admitting: Cardiology

## 2023-10-09 ENCOUNTER — Ambulatory Visit (HOSPITAL_COMMUNITY): Payer: Self-pay | Admitting: Pharmacist

## 2023-10-09 LAB — POCT INR: INR: 2.5 (ref 2.0–3.0)

## 2023-10-13 ENCOUNTER — Other Ambulatory Visit (HOSPITAL_COMMUNITY): Payer: Self-pay

## 2023-10-15 ENCOUNTER — Other Ambulatory Visit (HOSPITAL_COMMUNITY): Payer: Self-pay

## 2023-10-16 ENCOUNTER — Ambulatory Visit (HOSPITAL_COMMUNITY): Admit: 2023-10-16 | Admitting: Gastroenterology

## 2023-10-16 ENCOUNTER — Encounter (HOSPITAL_COMMUNITY): Payer: Self-pay

## 2023-10-16 ENCOUNTER — Ambulatory Visit (HOSPITAL_COMMUNITY): Payer: Self-pay | Admitting: Pharmacist

## 2023-10-16 LAB — POCT INR: INR: 2.6 (ref 2.0–3.0)

## 2023-10-16 SURGERY — COLONOSCOPY
Anesthesia: Monitor Anesthesia Care

## 2023-10-22 ENCOUNTER — Ambulatory Visit (INDEPENDENT_AMBULATORY_CARE_PROVIDER_SITE_OTHER): Admitting: Gastroenterology

## 2023-10-22 ENCOUNTER — Encounter: Payer: Self-pay | Admitting: Gastroenterology

## 2023-10-22 VITALS — BP 98/62 | HR 58 | Ht 72.0 in | Wt 213.2 lb

## 2023-10-22 DIAGNOSIS — Z7901 Long term (current) use of anticoagulants: Secondary | ICD-10-CM | POA: Diagnosis not present

## 2023-10-22 DIAGNOSIS — Z860100 Personal history of colon polyps, unspecified: Secondary | ICD-10-CM | POA: Diagnosis not present

## 2023-10-22 DIAGNOSIS — K5521 Angiodysplasia of colon with hemorrhage: Secondary | ICD-10-CM | POA: Diagnosis not present

## 2023-10-22 DIAGNOSIS — Z95811 Presence of heart assist device: Secondary | ICD-10-CM | POA: Diagnosis not present

## 2023-10-22 NOTE — Progress Notes (Signed)
 HPI :  68 year old male with a history of CAD/severe heart failure leading to LVAD in 2022, severe AS status post AVR, history endocarditis, history of suspected lung cancer, history of colon polyps.  Here for a follow-up visit to discuss possible surveillance colonoscopy.  His last colonoscopy with me was in December 2021, he had 4 polyps removed, 1 considered advanced.  All removed and recommended a repeat exam in 3 years.  He has not had any problems with his bowel habits, no blood in his stools currently.  He was admitted to the hospital for GI bleed in September 2024.  One of my colleagues performed an upper endoscopy and he had a actively bleeding duodenal AVM that was treated.  He has been on a few doses of IV iron since then his hemoglobin normalized.  He has not had any further bleeding since that time.  He is generally feeling well, LVAD he states is functioning well for him, he denies any symptoms related to his cardiopulmonary status currently.  He is quite active and tries to exercise routinely.  He reports having a clotting issue with his LVAD a few years ago and his baseline INR goal is 2.5-3.0.  He is moving his bowels okay  He reminds me that he had a lung nodule diagnosed 2 years ago that was suspicious for malignancy.  They opted not to biopsy it given his anticoagulated status and he had empiric radiation therapy.  He states this appears to have worked well and he is not aware of any recurrence.  We had a good discussion about colonoscopy and whether or not to proceed and how aggressive to be with that at this point in his life.  We had a frank discussion about his life expectancy, LVAD status, and what the colonoscopy would entail for him at this point.     02/10/23 echo- Left ventricular ejection fraction, by estimation, is <20%.  02/06/23 EGD with Dr. Rosaline Coma Impression:  - Normal esophagus.  - Gastritis.  - A single non- bleeding angioectasia in the duodenum. Treated with  argon plasma coagulation ( APC) .  - No specimens collected. 04/24/2020 colonoscopy with Dr. General Kenner, 3 year recall Impression:  - One 8 mm polyp in the cecum, removed with a cold snare. Resected and retrieved.  - Two 3 to 6 mm polyps in the ascending colon, removed with a cold snare. Resected and retrieved.  - One 15 mm polyp in the transverse colon, removed with a cold snare. Resected and retrieved.  - Diverticulosis in the sigmoid colon.  - Internal hemorrhoids.  - The examination was otherwise normal. 01/05/2014 colonoscopy with Dr. Arvie Latus Endoscopic impression Sessile polyp measuring 13 mm in size was found in the ascending colon polypectomy was performed using snare cautery, the wound at the site was closed by placing hemoclips Sessile polyp measuring 3 mm in size was found in the sigmoid colon polypectomy was performed with cold snare Mild diverticulosis was noted in sigmoid colon Internal hemorrhoids    Lab Results  Component Value Date   WBC 7.8 09/19/2023   HGB 14.6 09/19/2023   HCT 44.4 09/19/2023   MCV 87.2 09/19/2023   PLT 151 09/19/2023       Latest Ref Rng & Units 09/19/2023    9:53 AM 07/18/2023    9:57 AM 05/19/2023   12:49 PM  CBC  WBC 4.0 - 10.5 K/uL 7.8  7.2  8.5   Hemoglobin 13.0 - 17.0 g/dL 16.1  09.6  13.5   Hematocrit 39.0 - 52.0 % 44.4  46.4  42.7   Platelets 150 - 400 K/uL 151  152  169     Lab Results  Component Value Date   IRON 43 (L) 09/19/2023   TIBC 302 09/19/2023   FERRITIN 112 09/19/2023      Past Medical History:  Diagnosis Date   AICD (automatic cardioverter/defibrillator) present 08/31/2019   Ankylosing spondylitis (HCC)    Arthritis    BENIGN PROSTATIC HYPERTROPHY 06/07/2008   CHF (congestive heart failure) (HCC)    COLONIC POLYPS, HX OF 06/07/2008   Coronary artery disease    Depression    H/O hiatal hernia    Heart failure (HCC)    History of radiation therapy    Left Lung- 08/06/22-08/16/22- Dr. Retta Caster    HYPERLIPIDEMIA 06/07/2008   HYPERTENSION 06/07/2008   Myocardial infarction [facility]) 2005   NSTEMI, s/p LAD stent   NEPHROLITHIASIS, HX OF 06/07/2008   STEMI (ST elevation myocardial infarction) (HCC) 04/27/2019     Past Surgical History:  Procedure Laterality Date   AORTIC VALVE REPLACEMENT N/A 04/06/2021   Procedure: AORTIC VALVE REPLACEMENT (AVR) WITH INSPIRIS RESILIA  AORTIC VALVE SIZE ;  Surgeon: Bartley Lightning, MD;  Location: [facility] OR;  Service: Open Heart Surgery;  Laterality: N/A;   COLONOSCOPY WITH PROPOFOL  N/A 04/24/2020   Procedure: COLONOSCOPY WITH PROPOFOL ;  Surgeon: Ace Holder, MD;  Location: [facility] ENDOSCOPY;  Service: Gastroenterology;  Laterality: N/A;   CORONARY ANGIOPLASTY WITH STENT PLACEMENT  2005   LAD stent, jailed diagonal   CORONARY/GRAFT ACUTE MI REVASCULARIZATION N/A 04/27/2019   Procedure: Coronary/Graft Acute MI Revascularization;  Surgeon: Lucendia Rusk, MD;  Location: [facility] INVASIVE CV LAB;  Service: Cardiovascular;  Laterality: N/A;   ESOPHAGOGASTRODUODENOSCOPY (EGD) WITH PROPOFOL  N/A 02/06/2023   Procedure: ESOPHAGOGASTRODUODENOSCOPY (EGD) WITH PROPOFOL ;  Surgeon: Daina Drum, MD;  Location: [facility] ENDOSCOPY;  Service: Gastroenterology;  Laterality: N/A;   HOT HEMOSTASIS N/A 02/06/2023   Procedure: HOT HEMOSTASIS (ARGON PLASMA COAGULATION/BICAP);  Surgeon: Daina Drum, MD;  Location: [facility] ENDOSCOPY;  Service: Gastroenterology;  Laterality: N/A;   ICD IMPLANT  08/31/2019   ICD IMPLANT N/A 08/31/2019   Procedure: ICD IMPLANT;  Surgeon: Jolly Needle, MD;  Location: [facility] INVASIVE CV LAB;  Service: Cardiovascular;  Laterality: N/A;   ICD LEAD REMOVAL N/A 12/04/2020   Procedure: ICD SYSTEM EXTRACTION;  Surgeon: Boyce Byes, MD;  Location: [facility] OR;  Service: Cardiovascular;  Laterality: N/A;   INSERTION OF IMPLANTABLE LEFT VENTRICULAR ASSIST DEVICE N/A 04/06/2021   Procedure: INSERTION OF IMPLANTABLE LEFT VENTRICULAR ASSIST DEVICE;  Surgeon: Bartley Lightning, MD;  Location: [facility] OR;  Service: Open Heart Surgery;  Laterality: N/A;  HM3   IR THORACENTESIS ASP PLEURAL SPACE W/IMG GUIDE  04/19/2021   IR THORACENTESIS ASP PLEURAL SPACE W/IMG GUIDE  04/20/2021   IR THORACENTESIS ASP PLEURAL SPACE W/IMG GUIDE  04/23/2021   LAMINECTOMY     lumbar   LEFT HEART CATH AND CORONARY ANGIOGRAPHY N/A 04/27/2019   Procedure: LEFT HEART CATH AND CORONARY ANGIOGRAPHY;  Surgeon: Lucendia Rusk, MD;  Location: [facility] INVASIVE CV LAB;  Service: Cardiovascular;  Laterality: N/A;   LUMBAR FUSION  01/2012   POLYPECTOMY  04/24/2020   Procedure: POLYPECTOMY;  Surgeon: Ace Holder, MD;  Location: [facility] ENDOSCOPY;  Service: Gastroenterology;;   RIGHT HEART CATH N/A 04/27/2019   Procedure: RIGHT HEART CATH;  Surgeon: Swaz[place], Nahum M, MD;  Location: [facility] INVASIVE CV LAB;  Service:  Cardiovascular;  Laterality: N/A;   RIGHT HEART CATH N/A 03/28/2021   Procedure: RIGHT HEART CATH;  Surgeon: Mardell Shade, MD;  Location: [facility] INVASIVE CV LAB;  Service: Cardiovascular;  Laterality: N/A;   RIGHT HEART CATH AND CORONARY ANGIOGRAPHY N/A 05/06/2022   Procedure: RIGHT HEART CATH AND CORONARY ANGIOGRAPHY;  Surgeon: Mardell Shade, MD;  Location: [facility] INVASIVE CV LAB;  Service: Cardiovascular;  Laterality: N/A;   RIGHT/LEFT HEART CATH AND CORONARY ANGIOGRAPHY N/A 11/29/2020   Procedure: RIGHT/LEFT HEART CATH AND CORONARY ANGIOGRAPHY;  Surgeon: Mardell Shade, MD;  Location: [facility] INVASIVE CV LAB;  Service: Cardiovascular;  Laterality: N/A;   TEE WITHOUT CARDIOVERSION N/A 12/04/2020   Procedure: TRANSESOPHAGEAL ECHOCARDIOGRAM (TEE);  Surgeon: Boyce Byes, MD;  Location: [facility] OR;  Service: Cardiovascular;  Laterality: N/A;   TEE WITHOUT CARDIOVERSION N/A 04/06/2021   Procedure: TRANSESOPHAGEAL ECHOCARDIOGRAM (TEE);  Surgeon: Bartley Lightning, MD;  Location: [facility] OR;  Service: Open Heart Surgery;  Laterality: N/A;   TONSILLECTOMY     warthin tumor removal Left 2014    Family History  Problem Relation Age of Onset   Depression Father    Arthritis Sister        RA   Heart failure Mother    Other Neg Hx    Colon cancer Neg Hx    Esophageal cancer Neg Hx    Rectal cancer Neg Hx    Stomach cancer Neg Hx    Colon polyps Neg Hx    Social History   Tobacco Use   Smoking status: Former    Current packs/day: 0.00    Average packs/day: 1 pack/day for 40.0 years (40.0 ttl pk-yrs)    Types: Cigarettes    Start date: 07/26/1979    Quit date: 07/26/2019    Years since quitting: 4.2   Smokeless tobacco: Never   Tobacco comments:    1-1.5 pks per year x 40-45 yrs  Vaping Use   Vaping status: Former   Substances: CBD  Substance Use Topics   Alcohol  use: Yes    Alcohol /week: 7.0 standard drinks of alcohol     Types: 7 Shots of liquor per week    Comment: 2 vodka tonics a night   Drug use: Not Currently    Comment: occasionally - CBD oil   Current Outpatient Medications  Medication Sig Dispense Refill   acetaminophen  (TYLENOL ) 500 MG tablet Take 500-1,000 mg by mouth every 6 (six) hours as needed (for pain).     amoxicillin  (AMOXIL ) 500 MG capsule TAKE 4 CAPSULES (2,000 MG TOTAL) BY MOUTH AS NEEDED. 30-60 MINUTES BEFORE DENTAL PROCEDURE 4 capsule 3   atorvastatin  (LIPITOR ) 80 MG tablet TAKE 1 TABLET BY MOUTH ONCE DAILY AT 6PM. 90 tablet 3   Carboxymethylcellulose Sodium (ARTIFICIAL TEARS OP) Place 1 drop into both eyes daily as needed (for dryness or irritation).     diphenhydrAMINE  (BENADRYL ) 25 mg capsule Take 25 mg by mouth every 8 (eight) hours as needed.     dorzolamide (TRUSOPT) 2 % ophthalmic solution Place 1 drop into the left eye 3 (three) times daily.     eplerenone  (INSPRA ) 25 MG tablet Take 2 tablets (50 mg total) by mouth in the morning. 30 tablet 5   ezetimibe  (ZETIA ) 10 MG tablet TAKE 1 TABLET BY MOUTH DAILY 90 tablet 3   gabapentin  (NEURONTIN ) 300 MG capsule Take 1 capsule (300 mg total) by mouth 3 (three) times daily. 90 capsule 3    ketorolac (ACULAR) 0.5 % ophthalmic  solution Place 1 drop into the left eye 4 (four) times daily.     metoprolol  succinate (TOPROL -XL) 25 MG 24 hr tablet Take 1 tablet (25 mg total) by mouth daily. 90 tablet 3   nystatin  (MYCOSTATIN /NYSTOP ) powder Apply 1 Application topically 3 (three) times daily. 15 g 2   pantoprazole  (PROTONIX ) 40 MG tablet TAKE 1 TABLET BY MOUTH TWICE A DAY 180 tablet 2   potassium chloride  SA (KLOR-CON  M20) 20 MEQ tablet Take 1 tablet as needed when taking Torsemide      sertraline  (ZOLOFT ) 50 MG tablet Take 1 tablet (50 mg total) by mouth daily. Pt taking 50 mg daily 90 tablet 3   tamsulosin  (FLOMAX ) 0.4 MG CAPS capsule Take 1 capsule (0.4 mg total) by mouth daily. 90 capsule 3   torsemide  (DEMADEX ) 20 MG tablet TAKE 1 TABLET BY MOUTH AS NEEDED 90 tablet 1   traMADol  (ULTRAM ) 50 MG tablet TAKE 1-2 TABLETS BY MOUTH TWICE A DAY FOR 30 DAYS 120 tablet 0   warfarin (COUMADIN ) 2.5 MG tablet TAKE 7.5 MG (3 TABLETS) EVERY MONDAY/FRIDAY AND 5 MG (2 TABLETS) ALL OTHER DAYS OR AS INSTRUCTED BY LVAD CLINIC. (Patient taking differently: Take two of the 2.5 mg tablets for total of 5mg  on Mondays and all other days take 3 of the 2.5 mg tablets for total of 7.5mg  per patient) 180 tablet 11   ZYRTEC ALLERGY 10 MG tablet Take 10 mg by mouth daily.     No current facility-administered medications for this visit.   No Known Allergies   Review of Systems: All systems reviewed and negative except where noted in HPI.   Lab Results  Component Value Date   WBC 7.8 09/19/2023   HGB 14.6 09/19/2023   HCT 44.4 09/19/2023   MCV 87.2 09/19/2023   PLT 151 09/19/2023    Lab Results  Component Value Date   IRON 43 (L) 09/19/2023   TIBC 302 09/19/2023   FERRITIN 112 09/19/2023     Physical Exam: BP 98/62   Pulse (!) 58   Ht 6' (1.829 m)   Wt 213 lb 4 oz (96.7 kg)   SpO2 96%   BMI 28.92 kg/m  Constitutional: Pleasant,well-developed, male in no acute distress. Neurological: Alert  and oriented to person place and time. Psychiatric: Normal mood and affect. Behavior is normal.   ASSESSMENT: 68 y.o. male here for assessment of the following  1. History of colon polyps   2. AVM (arteriovenous malformation) of small bowel, acquired with hemorrhage   3. Presence of left ventricular assist device (LVAD) (HCC)   4. Anticoagulated    History of colon polyps in the past and is overdue for surveillance colonoscopy.  We discussed his other medical problems, namely his LVAD, anticoagulated status, and discussed risks and benefits of colonoscopy and if it was worth it for him to have further surveillance exams.  We had a pretty frank discussion about his life expectancy with LVAD in place and his comorbidities.  If he does have recurrent colon polyps, we discussed that this can take many years to have cancerous potential.  To do a colonoscopy for him will need to be done at [facility] for anesthesia support.  He would need to have his Coumadin  held and go on a Lovenox  bridge.  He would be at risk for bleeding with removal of polyps.  He is a bit concerned about holding the Coumadin  given his history of clotting in the past.  We discussed these  issues at length.  Given his LVAD in place, anticoagulation, history of lung cancer, life expectancy, I think long-term benefit for colonoscopy in light of these issues is probably low and feel the risks of further surveillance outweigh the benefits.  He understands this, after full discussion he is in agreement and does not wish to pursue further surveillance exams.  We discussed his bleeding episode back in September, he is at risk for recurrent GI bleeding, namely small bowel/upper tract AVMs.  Should he notice any recurrent symptoms he will let me know.  He understands this would require repeat hospitalization with endoscopy.  He is having his blood counts monitored closely by cardiology and they are stable at this time.  He can  follow-up with me as needed moving forward.   Christi Coward, MD [facility]

## 2023-10-23 ENCOUNTER — Ambulatory Visit (HOSPITAL_COMMUNITY): Payer: Self-pay | Admitting: Pharmacist

## 2023-10-23 LAB — POCT INR: INR: 2.7 (ref 2.0–3.0)

## 2023-10-30 ENCOUNTER — Ambulatory Visit (HOSPITAL_COMMUNITY): Payer: Self-pay | Admitting: Pharmacist

## 2023-10-30 LAB — POCT INR: INR: 2.6 (ref 2.0–3.0)

## 2023-11-06 ENCOUNTER — Ambulatory Visit (HOSPITAL_COMMUNITY): Payer: Self-pay

## 2023-11-06 ENCOUNTER — Encounter: Payer: Self-pay | Admitting: Emergency Medicine

## 2023-11-06 ENCOUNTER — Ambulatory Visit: Attending: Emergency Medicine | Admitting: Emergency Medicine

## 2023-11-06 VITALS — BP 98/68 | HR 78 | Ht 72.0 in | Wt 213.8 lb

## 2023-11-06 DIAGNOSIS — E785 Hyperlipidemia, unspecified: Secondary | ICD-10-CM | POA: Insufficient documentation

## 2023-11-06 LAB — POCT INR: INR: 2.9 (ref 2.0–3.0)

## 2023-11-06 MED ORDER — WARFARIN SODIUM 2.5 MG PO TABS: ORAL_TABLET | ORAL | Status: DC

## 2023-11-06 NOTE — Progress Notes (Signed)
 Cardiology Office Note:    Date:  11/06/2023  ID:  Brandon [name], DOB [birth year], MRN [id number] PCP: Mercer Clotilda SAUNDERS, MD  [facility] Providers Cardiologist:  Redell Shallow, MD Electrophysiologist:  OLE ONEIDA HOLTS, MD  Advanced Heart Failure:  Toribio Fuel, MD       Patient Profile:       Chief Complaint: 1 year follow-up for hyperlipidemia History of Present Illness:  Brandon [name] is a 68 y.o. male with visit-pertinent history of coronary artery disease s/p previous anterior MI, severe systolic heart failure s/p LVAD on 03/2021, severe AS s/p AVR, endocarditis, prior tobacco use, lung cancer  Admitted 04/2019 with anterior ST elevation MI.  Cath with chronically occluded RCA with L>R collaterals and thrombotic occlusion of proximal LAD treated with PCI.  Echo with reduced LVEF of 30 to 35% with apical aneurysm.  RV okay.  Post cath he required milrinone  with cardiogenic shock.  Subsequently underwent Barostim.  Underwent outpatient R/LHC on 11/2020 as part of VAD workup.  Postprocedure developed severe rigors and fever.  Admitted to ICU blood cultures positive for strep.  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  Likely low-flow low gradient AS. Underwent ICD extraction.  He was admitted for cardiogenic shock on 02/2021.  Underwent placement of HM3 that had an aortic valve replacement with 25 mm Edwards Inspiris Resilia valve on 01/2021.  Hospital course was complicated by atrial fibrillation, hyponatremia, bilateral pleural effusions s/p bilateral thoracentesis and COVID infection.  On 04/2022 he presented to the hospital shortness of breath and diaphoresis.  Found to be in ventricular fibrillation, was defibrillated.  He underwent cardiac catheterization which showed stable CAD.  Cardiac CT suspicious for clot of AoV.  INR increased to 2.5-3 range.  Had PET 1/22 for which showed increased size of spiculated LUL pulmonary nodule  which is hypermetabolic concerning for primary bronchogenic carcinoma.    Last seen by Dr. Fuel on 09/2023.  Mean he was doing great and continues to go to the gym regularly.  Says he feels the best he has in a long time.  He was last seen in clinic on 10/07/2023 by Ozell, [place] with EP.  Patient was doing well at the time without acute complaints.  He is continue current medication management and to follow-up with EP in 6 months.  He has been following with primary cardiology for his hyperlipidemia and prefers to do so.   Discussed the use of AI scribe software for clinical note transcription with the patient, who gave verbal consent to proceed.  History of Present Illness Brandon [name] is a 68 year old male who presents today for his 1 year follow-up for his hyperlipidemia.  Today patient is doing well overall.  He has been without cardiovascular concerns or complaints.  He denies any chest pains or anginal symptoms.  He has been living with an LVAD for nearly two years and reports significant improvement in his condition. He engages in daily exercise, including gym workouts, and has regained strength after initial weight loss and weakness.  He monitors his heart rate with an Apple watch and adjusts exercise intensity accordingly. His exercise routine includes 15 minutes on a recumbent bike and 10 minutes on a treadmill, with alternating strength training.  He is on Atorvastatin  and Zetia  to manage LDL cholesterol, last recorded at 59 mg/dL, below his goal of less than 70 mg/dL in 7975. He has made dietary changes, focusing on fresh  vegetables and reducing processed food intake, while rarely indulging in sweets in moderation.   Review of systems:  Please see the history of present illness. All other systems are reviewed and otherwise negative.      Studies Reviewed:        Echocardiogram 02/10/2023  1. LVIDd 6.03cm, ventricular septum is midline. Left ventricular ejection   fraction, by estimation, is <20%. The left ventricle has severely  decreased function. The left ventricle demonstrates global hypokinesis.  The left ventricular internal cavity size   was moderately dilated. The LVAD cannuyla is seen in the apex   2. RVOT VTI is 13.1cm. Right ventricular systolic function is mildly  reduced. The interventricular septum is midline   3. Trivial mitral valve regurgitation. Moderate mitral annular  calcification.   4. The bioprosthetic aortic valve is thickened. Aortic Valve opens 0/5.  Suspect small amount of mobile thrombus on the aortic side of the valve  but this is not seen well. Aortic valve regurgitation is not visualized.  There is a 25 mm Edwards  bioprosthetic valve present in the aortic position.   5. The inferior vena cava is normal in size with greater than 50%  respiratory variability, suggesting right atrial pressure of 3 mmHg.    Risk Assessment/Calculations:              Physical Exam:   VS:  BP 98/68   Pulse 78   Ht 6' (1.829 m)   Wt 213 lb 12.8 oz (97 kg)   SpO2 97%   BMI 29.00 kg/m    Wt Readings from Last 3 Encounters:  11/06/23 213 lb 12.8 oz (97 kg)  10/22/23 213 lb 4 oz (96.7 kg)  10/07/23 210 lb 6.4 oz (95.4 kg)    GEN: Well nourished, well developed in no acute distress NECK: No JVD; No carotid bruits CARDIAC: LVAD hum RESPIRATORY:  Clear to auscultation without rales, wheezing or rhonchi  ABDOMEN: Soft, non-tender, non-distended EXTREMITIES:  No edema; No acute deformity      Assessment and Plan:  Hyperlipidemia, LDL goal <70 LDL 59, TG 83 on 09/2022 LDL under excellent control - Plan for fasting lipid panel and LFTs today - Continue atorvastatin  80 mg daily and ezetimibe  10 mg daily - Did mention if Dr. Bensimhon wanted to take over his lipid management that would be fine.  Patient states he likes following up with general cardiology.  - Recommend DASH diet (high in vegetables, fruits, low-fat dairy products,  whole grains, poultry, fish, and nuts and low in sweets, sugar-sweetened beverages, and red meats), salt restriction and increase physical activity.   Chronic systolic HFrEF Ischemic cardiomyopathy S/p HM-III VAD + bioprosthetic AVR 04/06/21  - Today is euvolemic and well compensated exam.  Remains active and going to the gym daily without limitation.  NYHA class I with VAD support. - Management per Dr. Cherrie  Coronary artery disease S/p anterior STEMI 04/2019 with PCI to LAD S/p recurrent anterior STEMI in 04/2022 with LAD stent patent, felt to be embolic from aortic valve S/p LVAD - Today patient is doing well without anginal symptoms.  Remains active without exertional chest pain.  No indication for ischemic evaluation at this time - Continue aspirin  81 mg daily and atorvastatin  80 mg daily - Management per Dr. Cherrie      Dispo:  No follow-ups on file.  Signed, Brandon LITTIE Louis, NP

## 2023-11-06 NOTE — Progress Notes (Signed)
 LVAD INR

## 2023-11-06 NOTE — Patient Instructions (Signed)
 Medication Instructions:  Your physician recommends that you continue on your current medications as directed. Please refer to the Current Medication list given to you today.  *If you need a refill on your cardiac medications before your next appointment, please call your pharmacy*  Lab Work: Fasting lipid panel, LFTs at your earliest convenience  If you have labs (blood work) drawn today and your tests are completely normal, you will receive your results only by: MyChart Message (if you have MyChart) OR A paper copy in the mail If you have any lab test that is abnormal or we need to change your treatment, we will call you to review the results.  Testing/Procedures: NONE  Follow-Up: At [facility], you and your health needs are our priority.  As part of our continuing mission to provide you with exceptional heart care, our providers are all part of one team.  This team includes your primary Cardiologist (physician) and Advanced Practice Providers or APPs (Physician Assistants and Nurse Practitioners) who all work together to provide you with the care you need, when you need it.  Your next appointment:   1 year  Provider:   Lum, NP  We recommend signing up for the patient portal called MyChart.  Sign up information is provided on this After Visit Summary.  MyChart is used to connect with patients for Virtual Visits (Telemedicine).  Patients are able to view lab/test results, encounter notes, upcoming appointments, etc.  Non-urgent messages can be sent to your provider as well.   To learn more about what you can do with MyChart, go to [email or web address].

## 2023-11-11 ENCOUNTER — Other Ambulatory Visit: Payer: Self-pay

## 2023-11-13 ENCOUNTER — Ambulatory Visit (HOSPITAL_COMMUNITY): Payer: Self-pay | Admitting: Pharmacist

## 2023-11-13 LAB — POCT INR: INR: 2.6 (ref 2.0–3.0)

## 2023-11-17 ENCOUNTER — Other Ambulatory Visit (HOSPITAL_COMMUNITY): Payer: Self-pay | Admitting: Cardiology

## 2023-11-17 DIAGNOSIS — I5023 Acute on chronic systolic (congestive) heart failure: Secondary | ICD-10-CM

## 2023-11-17 DIAGNOSIS — E876 Hypokalemia: Secondary | ICD-10-CM

## 2023-11-19 ENCOUNTER — Ambulatory Visit: Payer: Self-pay | Admitting: Emergency Medicine

## 2023-11-19 LAB — HEPATIC FUNCTION PANEL
ALT: 20 IU/L (ref 0–44)
AST: 22 IU/L (ref 0–40)
Albumin: 4.5 g/dL (ref 3.9–4.9)
Alkaline Phosphatase: 143 IU/L — ABNORMAL HIGH (ref 44–121)
Bilirubin Total: 0.6 mg/dL (ref 0.0–1.2)
Bilirubin, Direct: 0.21 mg/dL (ref 0.00–0.40)
Total Protein: 7.3 g/dL (ref 6.0–8.5)

## 2023-11-19 LAB — LIPID PANEL
Chol/HDL Ratio: 3 ratio (ref 0.0–5.0)
Cholesterol, Total: 128 mg/dL (ref 100–199)
HDL: 42 mg/dL (ref >39)
LDL Chol Calc (NIH): 69 mg/dL (ref 0–99)
Triglycerides: 88 mg/dL (ref 0–149)
VLDL Cholesterol Cal: 17 mg/dL (ref 5–40)

## 2023-11-20 ENCOUNTER — Ambulatory Visit (HOSPITAL_COMMUNITY): Payer: Self-pay | Admitting: Pharmacist

## 2023-11-20 LAB — POCT INR: INR: 2.9 (ref 2.0–3.0)

## 2023-11-26 ENCOUNTER — Other Ambulatory Visit (HOSPITAL_COMMUNITY): Payer: Self-pay | Admitting: *Deleted

## 2023-11-26 DIAGNOSIS — Z7901 Long term (current) use of anticoagulants: Secondary | ICD-10-CM

## 2023-11-26 DIAGNOSIS — Z95811 Presence of heart assist device: Secondary | ICD-10-CM

## 2023-11-26 DIAGNOSIS — I5022 Chronic systolic (congestive) heart failure: Secondary | ICD-10-CM

## 2023-11-26 DIAGNOSIS — D509 Iron deficiency anemia, unspecified: Secondary | ICD-10-CM

## 2023-11-27 ENCOUNTER — Ambulatory Visit (HOSPITAL_COMMUNITY): Payer: Self-pay | Admitting: Pharmacist

## 2023-11-27 LAB — POCT INR: INR: 2.9 (ref 2.0–3.0)

## 2023-11-28 ENCOUNTER — Other Ambulatory Visit (HOSPITAL_COMMUNITY): Payer: Self-pay | Admitting: *Deleted

## 2023-11-28 ENCOUNTER — Ambulatory Visit (HOSPITAL_COMMUNITY)
Admission: RE | Admit: 2023-11-28 | Discharge: 2023-11-28 | Disposition: A | Discharge status: Home / Self Care | Source: Ambulatory Visit | Attending: Internal Medicine | Admitting: Internal Medicine

## 2023-11-28 VITALS — BP 104/79 | HR 63 | Ht 72.0 in | Wt 215.2 lb

## 2023-11-28 DIAGNOSIS — I5022 Chronic systolic (congestive) heart failure: Secondary | ICD-10-CM | POA: Diagnosis not present

## 2023-11-28 DIAGNOSIS — D509 Iron deficiency anemia, unspecified: Secondary | ICD-10-CM

## 2023-11-28 DIAGNOSIS — I2510 Atherosclerotic heart disease of native coronary artery without angina pectoris: Secondary | ICD-10-CM | POA: Diagnosis not present

## 2023-11-28 DIAGNOSIS — Z4509 Encounter for adjustment and management of other cardiac device: Secondary | ICD-10-CM | POA: Diagnosis not present

## 2023-11-28 DIAGNOSIS — I480 Paroxysmal atrial fibrillation: Secondary | ICD-10-CM

## 2023-11-28 DIAGNOSIS — Z95811 Presence of heart assist device: Secondary | ICD-10-CM

## 2023-11-28 DIAGNOSIS — Z7901 Long term (current) use of anticoagulants: Secondary | ICD-10-CM

## 2023-11-28 DIAGNOSIS — D500 Iron deficiency anemia secondary to blood loss (chronic): Secondary | ICD-10-CM

## 2023-11-28 LAB — CBC
HCT: 44.9 % (ref 39.0–52.0)
Hemoglobin: 14.3 g/dL (ref 13.0–17.0)
MCH: 28.8 pg (ref 26.0–34.0)
MCHC: 31.8 g/dL (ref 30.0–36.0)
MCV: 90.3 fL (ref 80.0–100.0)
Platelets: 160 K/uL (ref 150–400)
RBC: 4.97 MIL/uL (ref 4.22–5.81)
RDW: 15.3 % (ref 11.5–15.5)
WBC: 6.9 K/uL (ref 4.0–10.5)
nRBC: 0 % (ref 0.0–0.2)

## 2023-11-28 LAB — PROTIME-INR
INR: 2.8 — ABNORMAL HIGH (ref 0.8–1.2)
Prothrombin Time: 30.6 s — ABNORMAL HIGH (ref 11.4–15.2)

## 2023-11-28 LAB — BASIC METABOLIC PANEL WITH GFR
Anion gap: 7 (ref 5–15)
BUN: 13 mg/dL (ref 8–23)
CO2: 26 mmol/L (ref 22–32)
Calcium: 9.4 mg/dL (ref 8.9–10.3)
Chloride: 106 mmol/L (ref 98–111)
Creatinine, Ser: 1.13 mg/dL (ref 0.61–1.24)
GFR, Estimated: >60 mL/min (ref >60)
Glucose, Bld: 107 mg/dL — ABNORMAL HIGH (ref 70–99)
Potassium: 5 mmol/L (ref 3.5–5.1)
Sodium: 139 mmol/L (ref 135–145)

## 2023-11-28 LAB — FOLATE: Folate: 10.5 ng/mL (ref >5.9)

## 2023-11-28 LAB — MAGNESIUM: Magnesium: 1.8 mg/dL (ref 1.7–2.4)

## 2023-11-28 LAB — IRON AND TIBC
Iron: 44 ug/dL — ABNORMAL LOW (ref 45–182)
Saturation Ratios: 15 % — ABNORMAL LOW (ref 17.9–39.5)
TIBC: 287 ug/dL (ref 250–450)
UIBC: 243 ug/dL

## 2023-11-28 LAB — VITAMIN B12: Vitamin B-12: 262 pg/mL (ref 180–914)

## 2023-11-28 LAB — LACTATE DEHYDROGENASE: LDH: 178 U/L (ref 98–192)

## 2023-11-28 LAB — FERRITIN: Ferritin: 376 ng/mL — ABNORMAL HIGH (ref 24–336)

## 2023-11-28 NOTE — Progress Notes (Signed)
 Patient presents to V[facility] Clinic for 2 month f/u with 2.5 year Intermacs in VAD clinic alone. Reports no problems with VAD equipment or concerns with drive line.  Pt walked into clinic today. States he has been doing great. Denies lightheadedness, dizziness, shortness of breath, heart failure symptoms, and signs of bleeding.    Vital Signs:    Ht: 6' Doppler Pressure: 100 Automatic BP: 104/79 (89) HR: 63 SPO2: 98% on RA   Weight: 215.2  w/ ept Last weight: 213.4 lb w/ eqt Ht: 6'   VAD Interrogation: Speed: 5800 Flow: 5.0 Power: 4.78w    PI: 3.8 Alarms: none Events: > 100 PI events today  Fixed speed: 5800 Low speed limit: 5500  Primary controller: Back up battery due for replacement in 19 months Secondary controller:Back up battery due for replacement in 31 months    I reviewed the LVAD parameters from today and compared the results to the patient's prior recorded data. LVAD interrogation was NEGATIVE for significant power changes, NEGATIVE for clinical alarms and STABLE for PI events/speed drops. No programming changes were made and pump is functioning within specified parameters. Pt is performing daily controller and system monitor self tests along with completing weekly and monthly maintenance for LVAD equipment.   LVAD equipment check completed and is in good working order. Back-up equipment  present and charged at today's visit.   Annual Equipment Maintenance on UBC/PM was performed today 03/11/23.    Exit Site Care:  Existing VAD dressing dry and intact. Anchor intact and applied accurately. Pt denies any redness, tenderness, drainage, or pain at exit site. Neighbor continues to perform weekly dressing changes. Provided patient with 8 weekly dressing kits.     Device: N/A  (removed due to infection 12/04/20)  BP & Labs:  Doppler 100 - Doppler is reflecting modified systolic   Hgb pending - No S/S of bleeding. Specifically denies melena/BRBPR or nosebleeds.   LDH  pending with established baseline of 135 - 309. Denies tea-colored urine. No power elevations noted on interrogation.    Patient Instructions: No medication changes. Return to see Dr. Cherrie in 2 months. Please call us  if any questions or concerns prior to your next visit.   Geofm Christmas BSN, RN VAD Coordinator  Office: [phone]  24/7 Pager: [phone]

## 2023-11-28 NOTE — Patient Instructions (Addendum)
 No medication changes. Return to see Dr. Cherrie in 3 months - we will call to schedule. Please call us  if any questions or concerns prior to your next visit.

## 2023-12-02 ENCOUNTER — Encounter (HOSPITAL_COMMUNITY): Payer: Self-pay

## 2023-12-04 ENCOUNTER — Ambulatory Visit (HOSPITAL_COMMUNITY): Payer: Self-pay | Admitting: Pharmacist

## 2023-12-04 LAB — POCT INR: INR: 2.4 (ref 2.0–3.0)

## 2023-12-07 NOTE — Progress Notes (Signed)
 LVAD Follow-up Clinic Note   PCP: Mercer Clotilda SAUNDERS, MD HF MD: DB   HPI:  Brandon [name] is a 68 year old male with history of tobacco use, CAD s/p previous anterior MI, severe systolic HF s/p HM-3 VAD 04/06/21, severe AS s/p AVR, endocarditis.    Admitted 12/20 with anterior ST elevation MI. Cath with chronically occluded RCA (L>>R collaterals) and thrombotic occlusion of proximal LAD treated with PCI. Echo w/ reduced LVEF 30-35% w/ apical aneurysm. RV ok. Post cath, he required milrinone  for cardiogenic shock. Subsequently underwent Barostim.   Underwent outpatient [facility] 11/29/20 as part of VAD w/u. Post procedure, developed severe rigors and fever. Admitted to ICU Blood Cx + strep. 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  Likely low-flow low gradient AS. Underwent ICD extraction..    Admitted for cardiogenic shock in 10/22.  He underwent placement of HM3 VAD and aortic valve replacement with 25 mm Edwards Inspiris Resilia valve on 04/06/21. Surgical path came back 11/28 with evidence of possible acute aortic valve endocarditis. 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 Discharged 05/09/21.   He had a complicated month of December.  On 04/22/2022 he presented with shortness of breath and diaphoresis.  Found to be in ventricular fibrillation.  Was defibrillated.  Then on Christmas day he presented back with chest pain, nausea, and shortness of breath.  ECG showed ST elevation.  Catheterization showed stable CAD. Cardiac CT suspicious for clot on AoV. INR increased to 2.5-3.0 range.   Had PET 1/24 which showed increased size of the spiculated LUL pulmonary nodule which is hypermetabolic concerning for primary bronchogenic carcinoma. Has seen Dr. Kerrin and referred  for stereotactic radiation without biopsy.   Admitted on 02/05/23 with anemia hgb was 10.3. INR 2.0. He was given a bolus of IVFs. Protonix  increased to 40 mg bid. GI consulted. On 9/26, underwent EGD showing 1 duodenal AVM which was treated APC. During admission, episode of hypotension requiring additional 500cc bolus IVF. Epleronone decreases to 25mg  daily at discharge. Intermittent runs of VT    Follow up for Heart Failure/LVAD: Here for routine f/u. Doing great. Going to gym on a daily basis.Denies orthopnea or PND. No fevers, chills or problems with driveline. No bleeding, melena or neuro symptoms. No VAD alarms. Taking all meds as prescribed.    VAD Interrogation: Speed: 5800 Flow: 5.0 Power: 4.8 w    PI: 3.8 Alarms: none Events: > 100 PI events today  Fixed speed: 5800 Low speed limit: 5500   Primary controller: Back up battery due for replacement in 19 months Secondary controller:Back up battery due for replacement in 31 months    I reviewed the LVAD parameters from today and compared the results to the patient's prior recorded data. LVAD interrogation was NEGATIVE for significant power changes, NEGATIVE for clinical alarms and STABLE for PI events/speed drops. No programming changes were made and pump is functioning within specified parameters. Pt is performing daily controller and system monitor self tests along with completing weekly and monthly maintenance for LVAD equipment.   LVAD equipment check completed and is in good working order. Back-up equipment  present and charged at today's visit.   Annual Equipment Maintenance on UBC/PM was performed today 03/11/23.   Past Medical History:  Diagnosis Date   AICD (automatic cardioverter/defibrillator) present 08/31/2019  Ankylosing spondylitis (HCC)    Arthritis    BENIGN PROSTATIC HYPERTROPHY 06/07/2008   CHF (congestive heart failure) (HCC)    COLONIC POLYPS, HX OF 06/07/2008   Coronary artery disease    Depression    H/O  hiatal hernia    Heart failure (HCC)    History of radiation therapy    Left Lung- 08/06/22-08/16/22- Dr. Lynwood Nasuti   HYPERLIPIDEMIA 06/07/2008   HYPERTENSION 06/07/2008   Myocardial infarction [facility]) 2005   NSTEMI, s/p LAD stent   NEPHROLITHIASIS, HX OF 06/07/2008   STEMI (ST elevation myocardial infarction) (HCC) 04/27/2019    Current Outpatient Medications  Medication Sig Dispense Refill   acetaminophen  (TYLENOL ) 500 MG tablet Take 500-1,000 mg by mouth every 6 (six) hours as needed (for pain).     amoxicillin  (AMOXIL ) 500 MG capsule TAKE 4 CAPSULES (2,000 MG TOTAL) BY MOUTH AS NEEDED. 30-60 MINUTES BEFORE DENTAL PROCEDURE 4 capsule 3   atorvastatin  (LIPITOR ) 80 MG tablet TAKE 1 TABLET BY MOUTH ONCE DAILY AT 6PM. 90 tablet 3   Carboxymethylcellulose Sodium (ARTIFICIAL TEARS OP) Place 1 drop into both eyes daily as needed (for dryness or irritation).     diphenhydrAMINE  (BENADRYL ) 25 mg capsule Take 25 mg by mouth every 8 (eight) hours as needed.     dorzolamide (TRUSOPT) 2 % ophthalmic solution Place 1 drop into the left eye 3 (three) times daily.     eplerenone  (INSPRA ) 25 MG tablet Take 2 tablets (50 mg total) by mouth in the morning. 30 tablet 5   ezetimibe  (ZETIA ) 10 MG tablet TAKE 1 TABLET BY MOUTH DAILY 90 tablet 3   gabapentin  (NEURONTIN ) 300 MG capsule Take 1 capsule (300 mg total) by mouth 3 (three) times daily. 90 capsule 3   ketorolac (ACULAR) 0.5 % ophthalmic solution Place 1 drop into the left eye 4 (four) times daily.     metoprolol  succinate (TOPROL -XL) 25 MG 24 hr tablet Take 1 tablet (25 mg total) by mouth daily. 90 tablet 3   nystatin  (MYCOSTATIN /NYSTOP ) powder Apply 1 Application topically 3 (three) times daily. 15 g 2   pantoprazole  (PROTONIX ) 40 MG tablet TAKE 1 TABLET BY MOUTH TWICE A DAY 180 tablet 2   potassium chloride  SA (KLOR-CON  M20) 20 MEQ tablet Take 1 tablet (20 mEq total) by mouth as needed. 30 tablet 3   sertraline  (ZOLOFT ) 50 MG tablet Take 1 tablet  (50 mg total) by mouth daily. Pt taking 50 mg daily 90 tablet 3   tamsulosin  (FLOMAX ) 0.4 MG CAPS capsule Take 1 capsule (0.4 mg total) by mouth daily. 90 capsule 3   torsemide  (DEMADEX ) 20 MG tablet TAKE 1 TABLET BY MOUTH AS NEEDED 90 tablet 1   traMADol  (ULTRAM ) 50 MG tablet TAKE 1-2 TABLETS BY MOUTH TWICE A DAY FOR 30 DAYS 120 tablet 0   warfarin (COUMADIN ) 2.5 MG tablet TAKE 5 MG (2 TABLETS) EVERY MONDAY AND 7.5 MG (3 TABLETS) ALL OTHER DAYS OR AS INSTRUCTED BY LVAD CLINIC.     ZYRTEC ALLERGY 10 MG tablet Take 10 mg by mouth daily.     No current facility-administered medications for this encounter.    Patient has no known allergies.    Vital Signs:     Ht: 6' Doppler Pressure: 100 Automatic BP: 104/79 (89( HR: 63 SPO2: 98% on RA   Weight: 215.2  w/ ept Last weight: 213.4 lb w/ eqt    Physical Exam: General:  NAD.  HEENT: normal  Neck: supple. JVP not elevated.  Carotids 2+ bilat; no bruits. No lymphadenopathy or thryomegaly appreciated. Cor: LVAD hum.  Lungs: Clear. Abdomen: soft, nontender, non-distended. No hepatosplenomegaly. No bruits or masses. Good bowel sounds. Driveline site clean. Anchor in place.  Extremities: no cyanosis, clubbing, rash. Warm no edema  Neuro: alert & oriented x 3. No focal deficits. Moves all 4 without problem   ASSESSMENT AND PLAN:  1. Chronic systolic HFr EF due to iCM - Echo 05/19/20 EF 20-25% RV mildly HK. moderate AS  Mean gradient 13 AVA 1.2 cm2 DI 0.30 - s/p HM-III VAD + bioprosthetic AVR 04/06/21 - Episode of cardiogenic shock and anterior ST elevation 12/23. Cath with patent LAD stent. Cardiac CT suggestive of clot on aortic valve. Felt to be possible embolic event  - Continues to do very well. NYHA I - Volume ok. - Continue Toprol  12.5 daily - Continue eplerenone  50 mg daily   2. HM-3 LVAD implant - VAD interrogated personally. Parameters stable. - Ramp echo 12/30/22: LVEF < 20% LVIDd 6.1cm. Cannula well placed. RV mildly HK. AoV  not opening. No AI. Septum midline at 5800. No change in speed - ASA resumed due to previous thrombotic episode off AoV - INR 2.9 Goal 2.5-3.0 due to clotting on AoV and possible coronary embolism 12/23 Discussed warfarin dosing with PharmD personally. - LDH 178 - Hgb 14.3 (improved after IV iron) - MAPs ok - DL looks good  3. VT/VF 12/23.  - No recent events  4. Acute upper GI bleed 9/24 with iron-deficiency anemia - 02/06/23 underwent EGD showing 1 duodenal AVM which was treated APC - Hgb improved wit IV iron in 12/24. - Hgb 14.3 - Continue to follow  5. Severe low-flow aortic stenosis s/p AVR - s/p VAD/bioprosthetic AVR - No change   6. CAD  - s/p anterior STEMI (12/20). LHC showed chronically occluded RCA (with L>>R collaterals) and thrombotic occlusion of proximal LAD. Underwent PCI of LAD. - Now s/p VAD - Recurrent anterior STEMI in 12/23 Cath 12/23 LAD stent patent. Felt to be embolic from aortic valve - Continue atorvastatin  80.  - Continue ASA 81 with previous thrombotic episode off AoV - No s/s angina  Continue exercise program    7. Paroxysmal Atrial Fibrillation w/ RVR - Remains in NSR. Off amio  8. LUL Bronchogenic CA - h/o tobacco use - Has seen Dr. Kerrin.  - s/p XRT 5/24 - continue to follow with oncology - due for surveillance CT  9. Ascending aortic aneurysm - 4.1 cm on CT 8/24. Will followy - no changes  . I spent a total of 40 minutes today: 1) reviewing the patient's medical records including previous charts, labs and recent notes from other providers; 2) examining the patient and counseling them on their medical issues/explaining the plan of care; 3) adjusting meds as needed and 4) ordering lab work or other needed tests.     Toribio Fuel, MD  1:48 PM

## 2023-12-08 ENCOUNTER — Other Ambulatory Visit: Payer: Self-pay | Admitting: Family Medicine

## 2023-12-08 DIAGNOSIS — M456 Ankylosing spondylitis lumbar region: Secondary | ICD-10-CM

## 2023-12-09 ENCOUNTER — Other Ambulatory Visit (HOSPITAL_COMMUNITY): Payer: Self-pay | Admitting: Internal Medicine

## 2023-12-11 ENCOUNTER — Ambulatory Visit (HOSPITAL_COMMUNITY): Payer: Self-pay | Admitting: Pharmacist

## 2023-12-11 LAB — POCT INR: INR: 2.9 (ref 2.0–3.0)

## 2023-12-18 ENCOUNTER — Ambulatory Visit (HOSPITAL_COMMUNITY): Payer: Self-pay | Admitting: Pharmacist

## 2023-12-18 LAB — POCT INR: INR: 3 (ref 2.0–3.0)

## 2023-12-18 NOTE — Progress Notes (Signed)
 Brandon [name] is a 68 y.o. male referred by Dr. 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 Maree Quiet today OU Some CR Scars OS Not on any medications in the past  Ankylosing Spondylitis - in remission, not on remicade in the past  Intermediate dry AMD OU AREDS2 and Amsler  PCIOL OU  On xdemvy for blepharitis/mites   PLAN Stop ketorolac and dorzolamide VA too good for PDT Continue eplerenone  per cardiologist Ask docs about acetazolamide  500 mg BID RTC 12 weeks or sooner if needed Undilated exam OCT colors   Pertinent findings discussed with patient and questions answered. The Chief Complaint, HPI, ROS, and PFSHx as documented were reviewed with the patient and updated as appropriate.

## 2023-12-19 ENCOUNTER — Telehealth (HOSPITAL_COMMUNITY): Payer: Self-pay

## 2023-12-19 NOTE — Telephone Encounter (Signed)
 Pt's Retina Specialist at [facility] requesting Dr. Nelle approval to start Acetazolamide  500mg  BID. No interactions noted with current medications per [facility]. Dr. Bensimhon is fine with proceeding with new prescription.  Schuyler Lunger RN, BSN VAD Coordinator 24/7 Pager [phone]

## 2023-12-25 ENCOUNTER — Ambulatory Visit (HOSPITAL_COMMUNITY): Payer: Self-pay | Admitting: Pharmacist

## 2023-12-25 ENCOUNTER — Other Ambulatory Visit (HOSPITAL_COMMUNITY): Payer: Self-pay

## 2023-12-25 LAB — POCT INR: INR: 2.8 (ref 2.0–3.0)

## 2023-12-26 ENCOUNTER — Encounter (HOSPITAL_COMMUNITY): Payer: Self-pay | Admitting: Pharmacist

## 2023-12-26 ENCOUNTER — Other Ambulatory Visit (HOSPITAL_COMMUNITY): Payer: Self-pay

## 2023-12-27 ENCOUNTER — Other Ambulatory Visit (HOSPITAL_COMMUNITY): Payer: Self-pay | Admitting: Internal Medicine

## 2023-12-29 ENCOUNTER — Other Ambulatory Visit: Payer: Self-pay

## 2023-12-29 ENCOUNTER — Other Ambulatory Visit (HOSPITAL_COMMUNITY): Payer: Self-pay | Admitting: *Deleted

## 2023-12-29 ENCOUNTER — Other Ambulatory Visit (HOSPITAL_COMMUNITY): Payer: Self-pay

## 2023-12-29 DIAGNOSIS — Z95811 Presence of heart assist device: Secondary | ICD-10-CM

## 2023-12-29 DIAGNOSIS — I5022 Chronic systolic (congestive) heart failure: Secondary | ICD-10-CM

## 2023-12-29 MED ORDER — GABAPENTIN 300 MG PO CAPS
300.0000 mg | ORAL_CAPSULE | Freq: Three times a day (TID) | ORAL | 3 refills | Status: AC
Start: 2023-12-29 — End: ?
  Filled 2023-12-29: qty 270, 90d supply, fill #0
  Filled 2024-03-24: qty 270, 90d supply, fill #1

## 2023-12-31 ENCOUNTER — Other Ambulatory Visit (HOSPITAL_COMMUNITY): Payer: Self-pay | Admitting: Internal Medicine

## 2024-01-01 ENCOUNTER — Ambulatory Visit (HOSPITAL_COMMUNITY): Payer: Self-pay | Admitting: Pharmacist

## 2024-01-01 LAB — POCT INR: INR: 2.2 (ref 2.0–3.0)

## 2024-01-08 ENCOUNTER — Ambulatory Visit (HOSPITAL_COMMUNITY)
Admission: RE | Admit: 2024-01-08 | Discharge: 2024-01-08 | Disposition: A | Discharge status: Home / Self Care | Source: Ambulatory Visit | Attending: Radiology | Admitting: Radiology

## 2024-01-08 ENCOUNTER — Ambulatory Visit (HOSPITAL_COMMUNITY): Payer: Self-pay | Admitting: Pharmacist

## 2024-01-08 DIAGNOSIS — C3412 Malignant neoplasm of upper lobe, left bronchus or lung: Secondary | ICD-10-CM | POA: Diagnosis present

## 2024-01-08 LAB — POCT INR: INR: 2.2 (ref 2.0–3.0)

## 2024-01-14 ENCOUNTER — Telehealth (HOSPITAL_COMMUNITY): Payer: Self-pay | Admitting: Unknown Physician Specialty

## 2024-01-14 NOTE — Telephone Encounter (Signed)
 Pt called to let us  know that he stopped the Acetazolamide  500mg  BID eye drops that his eye doctor recently prescribed due to diarrhea. Pt instructed to please call his eye doctor to let their office know that he has stopped the eye drops. Pt informed that if they start him on something new to please let us  know so that we can make sure he has no med interactions with our pharmacist. Pt verbalized understanding.  Lauraine Ip RN, BSN VAD Coordinator 24/7 Pager [phone]

## 2024-01-14 NOTE — Progress Notes (Signed)
 Radiation Oncology         [phone] ________________________________  Name: Brandon [name] MRN: [id number]  Date: 01/15/2024  DOB: 1955-08-13  Follow-Up Visit Note  CC: Mercer Clotilda SAUNDERS, MD  Mercer Clotilda SAUNDERS, MD  No diagnosis found.  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 A diagnosis of Lung nodule was also pertinent to this visit.   PET avid left upper lobe pulmonary nodule; s/p SBRT     Interval Since Last Radiation: 1 year 5 months  Indication for treatment:  Curative       Radiation treatment dates: from 08/06/22 through 08/16/22  Site/dose: Left lung - treated in total with 50 Gy delivered in 5 Fx at 10 Gy/Fx Beams/energy: 6X-FFF   Narrative:  The patient returns today for routine follow-up and to review recent imaging. He was last seen in office on 07/10/23 for a follow up visit. Patient continued to follow up with their specialists to manage their chronic conditions.   Most recent CT chest performed on 01/08/24 showed    No other significant oncologic interval history since the patient was last seen.                          Allergies:  has no known allergies.  Meds: Current Outpatient Medications  Medication Sig Dispense Refill   acetaminophen  (TYLENOL ) 500 MG tablet Take 500-1,000 mg by mouth every 6 (six) hours as needed (for pain).     amoxicillin  (AMOXIL ) 500 MG capsule TAKE 4 CAPSULES (2,000 MG TOTAL) BY MOUTH AS NEEDED. 30-60 MINUTES BEFORE DENTAL PROCEDURE 4 capsule 3   atorvastatin  (LIPITOR ) 80 MG tablet TAKE 1 TABLET BY MOUTH ONCE DAILY AT 6PM. 90 tablet 3   Carboxymethylcellulose Sodium (ARTIFICIAL TEARS OP) Place 1 drop into both eyes daily as needed (for dryness or irritation).     diphenhydrAMINE  (BENADRYL ) 25 mg capsule Take 25 mg by mouth every 8 (eight) hours as needed.     dorzolamide (TRUSOPT) 2 % ophthalmic solution Place 1 drop into the left eye 3 (three) times daily.      eplerenone  (INSPRA ) 25 MG tablet Take 2 tablets (50 mg total) by mouth in the morning. 30 tablet 5   ezetimibe  (ZETIA ) 10 MG tablet TAKE 1 TABLET BY MOUTH DAILY 90 tablet 3   gabapentin  (NEURONTIN ) 300 MG capsule Take 1 capsule (300 mg total) by mouth 3 (three) times daily. 270 capsule 3   ketorolac (ACULAR) 0.5 % ophthalmic solution Place 1 drop into the left eye 4 (four) times daily.     metoprolol  succinate (TOPROL -XL) 25 MG 24 hr tablet Take 1 tablet (25 mg total) by mouth daily. 90 tablet 3   nystatin  (MYCOSTATIN /NYSTOP ) powder Apply 1 Application topically 3 (three) times daily. 15 g 2   pantoprazole  (PROTONIX ) 40 MG tablet TAKE 1 TABLET BY MOUTH TWICE A DAY 180 tablet 2   potassium chloride  SA (KLOR-CON  M20) 20 MEQ tablet Take 1 tablet (20 mEq total) by mouth as needed. 30 tablet 3   sertraline  (ZOLOFT ) 50 MG tablet Take 1 tablet (50 mg total) by mouth daily. Pt taking 50 mg daily 90 tablet 3   tamsulosin  (FLOMAX ) 0.4 MG CAPS capsule Take 1 capsule (0.4 mg total) by mouth daily. 90 capsule 3   torsemide  (DEMADEX ) 20 MG tablet TAKE 1 TABLET BY MOUTH AS NEEDED 90 tablet 1   traMADol  (ULTRAM ) 50 MG  tablet TAKE 1 TO 2 TABLETS BY MOUTH 2 TIMES A DAY 120 tablet 1   warfarin (COUMADIN ) 2.5 MG tablet TAKE 7.5 MG (3 TABLETS) EVERY MONDAY/FRIDAY AND 5 MG (2 TABLETS) ALL OTHER DAYS OR AS INSTRUCTED BY LVAD CLINIC. 180 tablet 11   ZYRTEC ALLERGY 10 MG tablet Take 10 mg by mouth daily.     No current facility-administered medications for this encounter.    Physical Findings: The patient is in no acute distress. Patient is alert and oriented.  vitals were not taken for this visit. .  No significant changes. Lungs are clear to auscultation bilaterally. Heart has regular rate and rhythm. No palpable cervical, supraclavicular, or axillary adenopathy. Abdomen soft, non-tender, normal bowel sounds.   Lab Findings: Lab Results  Component Value Date   WBC 6.9 11/28/2023   HGB 14.3 11/28/2023   HCT 44.9  11/28/2023   MCV 90.3 11/28/2023   PLT 160 11/28/2023    Radiographic Findings: No results found.  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  The patient is recovering from the effects of radiation.  ***  Plan:  ***   *** minutes of total time was spent for this patient encounter, including preparation, face-to-face counseling with the patient and coordination of care, physical exam, and documentation of the encounter. ____________________________________  Lynwood CHARM Nasuti, PhD, MD  This document serves as a record of services personally performed by Lynwood Nasuti, MD. It was created on his behalf by Reymundo Cartwright, a trained medical scribe. The creation of this record is based on the scribe's personal observations and the provider's statements to them. This document has been checked and approved by the attending provider.

## 2024-01-15 ENCOUNTER — Ambulatory Visit (HOSPITAL_COMMUNITY): Payer: Self-pay | Admitting: Pharmacist

## 2024-01-15 ENCOUNTER — Encounter: Payer: Self-pay | Admitting: Radiation Oncology

## 2024-01-15 ENCOUNTER — Ambulatory Visit
Admission: RE | Admit: 2024-01-15 | Discharge: 2024-01-15 | Disposition: A | Discharge status: Home / Self Care | Payer: Medicare Other | Source: Ambulatory Visit | Attending: Radiation Oncology | Admitting: Radiation Oncology

## 2024-01-15 VITALS — BP 116/86 | HR 58 | Temp 97.8°F | Resp 18 | Ht 72.0 in | Wt 210.2 lb

## 2024-01-15 DIAGNOSIS — C3412 Malignant neoplasm of upper lobe, left bronchus or lung: Secondary | ICD-10-CM | POA: Insufficient documentation

## 2024-01-15 DIAGNOSIS — J432 Centrilobular emphysema: Secondary | ICD-10-CM | POA: Diagnosis not present

## 2024-01-15 DIAGNOSIS — J438 Other emphysema: Secondary | ICD-10-CM | POA: Insufficient documentation

## 2024-01-15 DIAGNOSIS — K8020 Calculus of gallbladder without cholecystitis without obstruction: Secondary | ICD-10-CM | POA: Insufficient documentation

## 2024-01-15 DIAGNOSIS — I2510 Atherosclerotic heart disease of native coronary artery without angina pectoris: Secondary | ICD-10-CM | POA: Diagnosis not present

## 2024-01-15 DIAGNOSIS — Z923 Personal history of irradiation: Secondary | ICD-10-CM | POA: Diagnosis not present

## 2024-01-15 DIAGNOSIS — I7121 Aneurysm of the ascending aorta, without rupture: Secondary | ICD-10-CM | POA: Insufficient documentation

## 2024-01-15 DIAGNOSIS — Z79899 Other long term (current) drug therapy: Secondary | ICD-10-CM | POA: Diagnosis not present

## 2024-01-15 DIAGNOSIS — I700 Atherosclerosis of aorta: Secondary | ICD-10-CM | POA: Diagnosis not present

## 2024-01-15 DIAGNOSIS — R911 Solitary pulmonary nodule: Secondary | ICD-10-CM | POA: Insufficient documentation

## 2024-01-15 LAB — POCT INR: INR: 2.4 (ref 2.0–3.0)

## 2024-01-15 NOTE — Progress Notes (Signed)
 Brandon [name] is here today for follow up post radiation to the lung.  Lung Side:Left, patient completed treatment on 08/16/22.   Does the patient complain of any of the following: Pain:no Shortness of breath w/wo exertion: yes, mostly on exertion.  Cough: No Hemoptysis: No Pain with swallowing: No Swallowing/choking concerns: No Appetite: Good  Energy Level: Fair  Post radiation skin Changes: No    Additional comments if applicable:  Noticed blood when blowing nose.   BP 116/86 (BP Location: Left Arm, Patient Position: Sitting, Cuff Size: Large)   Pulse (!) 58   Temp 97.8 F (36.6 C)   Resp 18   Ht 6' (1.829 m)   Wt 210 lb 3.2 oz (95.3 kg)   SpO2 100%   BMI 28.51 kg/m

## 2024-01-18 ENCOUNTER — Other Ambulatory Visit (HOSPITAL_COMMUNITY): Payer: Self-pay

## 2024-01-18 DIAGNOSIS — Z95811 Presence of heart assist device: Secondary | ICD-10-CM

## 2024-01-18 DIAGNOSIS — Z7901 Long term (current) use of anticoagulants: Secondary | ICD-10-CM

## 2024-01-19 ENCOUNTER — Ambulatory Visit (HOSPITAL_COMMUNITY)
Admission: RE | Admit: 2024-01-19 | Discharge: 2024-01-19 | Disposition: A | Discharge status: Home / Self Care | Source: Ambulatory Visit | Attending: Internal Medicine | Admitting: Internal Medicine

## 2024-01-19 VITALS — BP 102/0 | HR 80 | Wt 212.0 lb

## 2024-01-19 DIAGNOSIS — Z95811 Presence of heart assist device: Secondary | ICD-10-CM | POA: Diagnosis present

## 2024-01-19 DIAGNOSIS — Z952 Presence of prosthetic heart valve: Secondary | ICD-10-CM

## 2024-01-19 DIAGNOSIS — I5022 Chronic systolic (congestive) heart failure: Secondary | ICD-10-CM | POA: Diagnosis not present

## 2024-01-19 DIAGNOSIS — I480 Paroxysmal atrial fibrillation: Secondary | ICD-10-CM | POA: Diagnosis not present

## 2024-01-19 DIAGNOSIS — Z7901 Long term (current) use of anticoagulants: Secondary | ICD-10-CM

## 2024-01-19 DIAGNOSIS — K2100 Gastro-esophageal reflux disease with esophagitis, without bleeding: Secondary | ICD-10-CM

## 2024-01-19 LAB — BASIC METABOLIC PANEL WITH GFR
Anion gap: 9 (ref 5–15)
BUN: 9 mg/dL (ref 8–23)
CO2: 27 mmol/L (ref 22–32)
Calcium: 9.2 mg/dL (ref 8.9–10.3)
Chloride: 103 mmol/L (ref 98–111)
Creatinine, Ser: 0.79 mg/dL (ref 0.61–1.24)
GFR, Estimated: >60 mL/min (ref >60)
Glucose, Bld: 104 mg/dL — ABNORMAL HIGH (ref 70–99)
Potassium: 4.1 mmol/L (ref 3.5–5.1)
Sodium: 139 mmol/L (ref 135–145)

## 2024-01-19 LAB — PROTIME-INR
INR: 2.4 — ABNORMAL HIGH (ref 0.8–1.2)
Prothrombin Time: 27.3 s — ABNORMAL HIGH (ref 11.4–15.2)

## 2024-01-19 LAB — CBC
HCT: 45.1 % (ref 39.0–52.0)
Hemoglobin: 14.8 g/dL (ref 13.0–17.0)
MCH: 29.2 pg (ref 26.0–34.0)
MCHC: 32.8 g/dL (ref 30.0–36.0)
MCV: 89.1 fL (ref 80.0–100.0)
Platelets: 122 K/uL — ABNORMAL LOW (ref 150–400)
RBC: 5.06 MIL/uL (ref 4.22–5.81)
RDW: 15.8 % — ABNORMAL HIGH (ref 11.5–15.5)
WBC: 7.7 K/uL (ref 4.0–10.5)
nRBC: 0 % (ref 0.0–0.2)

## 2024-01-19 LAB — LACTATE DEHYDROGENASE: LDH: 201 U/L — ABNORMAL HIGH (ref 98–192)

## 2024-01-19 NOTE — Progress Notes (Signed)
 Patient presents to VAD Clinic for sick visit with his sister. Reports no problems with VAD equipment or concerns with drive line.  Pt paged VAD Coordinator yesterday evening stating he was feeling poorly. Reported symptoms of nausea, shortness of breath, mild chest pain and chills. Pt states chest pain was worse when he would lay flat. Pt had active weekend planning a birthday party for his sister where he ate a lot of fried and high acidic foods. Pt took an OTC acid reducer and reported improvement with his symptoms.   Pt walked into clinic independently. Denies lightheadedness, dizziness, falls, shortness of breath, and signs of bleeding. Mild chest pain has resolved. Pt states  I feel great. Denies fever or sick contacts.   Vital Signs:    Ht: 6' Doppler Pressure: 102 Automatic BP: 112/95 (100) HR: 80 SPO2: 97% on RA   Weight: 212 w/ ept Last weight: 215.2 lb w/ eqt Ht: 6'   VAD Interrogation: Speed: 5800 Flow: 5.7 Power: 4.9w    PI: 3.3 Alarms: none Events: 10 PI events today; average 50-100 daily  Fixed speed: 5800 Low speed limit: 5500  Primary controller: Back up battery due for replacement in 19 months Secondary controller:Back up battery due for replacement in 31 months    I reviewed the LVAD parameters from today and compared the results to the patient's prior recorded data. LVAD interrogation was NEGATIVE for significant power changes, NEGATIVE for clinical alarms and STABLE for PI events/speed drops. No programming changes were made and pump is functioning within specified parameters. Pt is performing daily controller and system monitor self tests along with completing weekly and monthly maintenance for LVAD equipment.   LVAD equipment check completed and is in good working order. Back-up equipment  present and charged at today's visit.   Annual Equipment Maintenance on UBC/PM was performed today 03/11/23.    Exit Site Care:  Existing VAD dressing dry and intact.  Anchor intact and applied accurately. Pt denies any redness, tenderness, drainage, or pain at exit site. Neighbor continues to perform weekly dressing changes. Provided patient with 8 weekly dressing kits.     Device: N/A  (removed due to infection 12/04/20)  BP & Labs:  Doppler 102 - Doppler is reflecting modified systolic   Hgb 14.8 - No S/S of bleeding. Specifically denies melena/BRBPR or nosebleeds.   LDH 201 with established baseline of 135 - 309. Denies tea-colored urine. No power elevations noted on interrogation.    Patient Instructions: No medication changes. Return to see Dr. Cherrie in 2 months. Please call us  if any questions or concerns prior to your next visit.   Schuyler Lunger RN, BSN VAD Coordinator 24/7 Pager [phone]

## 2024-01-19 NOTE — Progress Notes (Signed)
 LVAD Follow-up Clinic Note   PCP: Mercer Clotilda SAUNDERS, MD HF MD: DB   HPI:  Brandon [name] is a 68 year old male with history of tobacco use, CAD s/p previous anterior MI, severe systolic HF s/p HM-3 VAD 04/06/21, severe AS s/p AVR, endocarditis.    Admitted 12/20 with anterior ST elevation MI. Cath with chronically occluded RCA (L>>R collaterals) and thrombotic occlusion of proximal LAD treated with PCI. Echo w/ reduced LVEF 30-35% w/ apical aneurysm. RV ok. Post cath, he required milrinone  for cardiogenic shock. Subsequently underwent Barostim.   Underwent outpatient [facility] 11/29/20 as part of VAD w/u. Post procedure, developed severe rigors and fever. Admitted to ICU Blood Cx + strep. 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  Likely low-flow low gradient AS. Underwent ICD extraction..    Admitted for cardiogenic shock in 10/22.  He underwent placement of HM3 VAD and aortic valve replacement with 25 mm Edwards Inspiris Resilia valve on 04/06/21. Surgical path came back 11/28 with evidence of possible acute aortic valve endocarditis. 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 Discharged 05/09/21.   He had a complicated month of December.  On 04/22/2022 he presented with shortness of breath and diaphoresis.  Found to be in ventricular fibrillation.  Was defibrillated.  Then on Christmas day he presented back with chest pain, nausea, and shortness of breath.  ECG showed ST elevation.  Catheterization showed stable CAD. Cardiac CT suspicious for clot on AoV. INR increased to 2.5-3.0 range.   Had PET 1/24 which showed increased size of the spiculated LUL pulmonary nodule which is hypermetabolic concerning for primary bronchogenic carcinoma. Has seen Dr. Kerrin and referred  for stereotactic radiation without biopsy.   Admitted on 02/05/23 with anemia hgb was 10.3. INR 2.0. He was given a bolus of IVFs. Protonix  increased to 40 mg bid. GI consulted. On 9/26, underwent EGD showing 1 duodenal AVM which was treated APC. During admission, episode of hypotension requiring additional 500cc bolus IVF. Epleronone decreases to 25mg  daily at discharge. Intermittent runs of VT    Follow up for Heart Failure/LVAD: Here for unscheduled visit. Called yesterday about not feeling well and having chills, nausea and chest pain. Presents today with his sister. Says he now feels great. Attributes symptoms to eating a lot of fried foods and getting GERD and also being in a cold room. Resolved with OTC antacid, Denies any URI symptoms. Denies orthopnea or PND or problems with driveline. No bleeding, melena or neuro symptoms. No VAD alarms. Taking all meds as prescribed.   LVAD Documentation    01/19/2024  Device Info  LVAD Type: Heartmate III  Date of Implant: 04/06/2021  Therapy Type: Destination Therapy      01/19/2024  Vitals  Heart Rate: 80 BPM  Automatic BP: 112/95  Doppler MAP: 102 mmHg  SpO2: 97 %    Last 3 Weights Weight Weight  01/19/2024 96.163 kg 212 lb  01/15/2024 95.346 kg 210 lb 3.2 oz  11/28/2023 97.614 kg 215 lb 3.2 oz       01/19/2024  LVAD Paramaters  Speed: 5800 RPM  Flow: 5 LPM  PI: 4  Power: 5 Watts  Hematocrit: 42 %  Alarms: none  Events: 50-100  Last Speed Change Date: 04/16/2021  Last Ramp Echo Date: 12/30/2022  Last Right Heart Cath  Date: 05/06/2022  Bleeding History: Yes  Type of Bleeding Hx: GI Bleed  Type of Dressing: Weekly  Annual Maintenance Date: 04/07/2023    Labs    Units 01/22/24 0000 01/19/24 0932 01/15/24 0000 12/04/23 0000 11/28/23 1118 09/25/23 0000 09/19/23 0953  INR  2.3 2.4* 2.4   < > 2.8*   < > 2.9*  LDH U/L  --  201*  --   --  178  --  154  HGB g/dL  --  85.1  --   --  85.6  --  14.6  CREATININE mg/dL  --  9.20  --   --   8.86  --  1.06   < > = values in this interval not displayed.           Past Medical History:  Diagnosis Date   AICD (automatic cardioverter/defibrillator) present 08/31/2019   Ankylosing spondylitis (HCC)    Arthritis    BENIGN PROSTATIC HYPERTROPHY 06/07/2008   CHF (congestive heart failure) (HCC)    COLONIC POLYPS, HX OF 06/07/2008   Coronary artery disease    Depression    H/O hiatal hernia    Heart failure (HCC)    History of radiation therapy    Left Lung- 08/06/22-08/16/22- Dr. Lynwood Nasuti   HYPERLIPIDEMIA 06/07/2008   HYPERTENSION 06/07/2008   Myocardial infarction [facility]) 2005   NSTEMI, s/p LAD stent   NEPHROLITHIASIS, HX OF 06/07/2008   STEMI (ST elevation myocardial infarction) (HCC) 04/27/2019    Current Outpatient Medications  Medication Sig Dispense Refill   acetaminophen  (TYLENOL ) 500 MG tablet Take 500-1,000 mg by mouth every 6 (six) hours as needed (for pain).     amoxicillin  (AMOXIL ) 500 MG capsule TAKE 4 CAPSULES (2,000 MG TOTAL) BY MOUTH AS NEEDED. 30-60 MINUTES BEFORE DENTAL PROCEDURE 4 capsule 3   atorvastatin  (LIPITOR ) 80 MG tablet TAKE 1 TABLET BY MOUTH ONCE DAILY AT 6PM. 90 tablet 3   Carboxymethylcellulose Sodium (ARTIFICIAL TEARS OP) Place 1 drop into both eyes daily as needed (for dryness or irritation).     diphenhydrAMINE  (BENADRYL ) 25 mg capsule Take 25 mg by mouth every 8 (eight) hours as needed.     eplerenone  (INSPRA ) 25 MG tablet Take 2 tablets (50 mg total) by mouth in the morning. 30 tablet 5   ezetimibe  (ZETIA ) 10 MG tablet TAKE 1 TABLET BY MOUTH DAILY 90 tablet 3   gabapentin  (NEURONTIN ) 300 MG capsule Take 1 capsule (300 mg total) by mouth 3 (three) times daily. 270 capsule 3   metoprolol  succinate (TOPROL -XL) 25 MG 24 hr tablet Take 1 tablet (25 mg total) by mouth daily. 90 tablet 3   nystatin  (MYCOSTATIN /NYSTOP ) powder Apply 1 Application topically 3 (three) times daily. 15 g 2   pantoprazole  (PROTONIX ) 40 MG tablet TAKE 1 TABLET BY  MOUTH TWICE A DAY 180 tablet 2   potassium chloride  SA (KLOR-CON  M20) 20 MEQ tablet Take 1 tablet (20 mEq total) by mouth as needed. 30 tablet 3   sertraline  (ZOLOFT ) 50 MG tablet Take 1 tablet (50 mg total) by mouth daily. Pt taking 50 mg daily 90 tablet 3   tamsulosin  (FLOMAX ) 0.4 MG CAPS capsule Take 1 capsule (0.4 mg total) by mouth daily. 90 capsule 3   torsemide  (DEMADEX ) 20 MG tablet TAKE 1 TABLET BY MOUTH AS NEEDED 90 tablet 1   traMADol  (ULTRAM ) 50 MG tablet TAKE 1 TO 2 TABLETS BY MOUTH 2 TIMES A DAY 120 tablet 1   warfarin (COUMADIN ) 2.5 MG tablet  TAKE 7.5 MG (3 TABLETS) EVERY MONDAY/FRIDAY AND 5 MG (2 TABLETS) ALL OTHER DAYS OR AS INSTRUCTED BY LVAD CLINIC. 180 tablet 11   ZYRTEC ALLERGY 10 MG tablet Take 10 mg by mouth daily.     No current facility-administered medications for this encounter.    Patient has no known allergies.    Vital Signs:     See above   Physical Exam: General:  NAD.  HEENT: normal  Neck: supple. JVP not elevated.  Carotids 2+ bilat; no bruits. No lymphadenopathy or thryomegaly appreciated. Cor: LVAD hum.  Lungs: Clear. Abdomen: soft, nontender, non-distended. No hepatosplenomegaly. No bruits or masses. Good bowel sounds. Driveline site clean. Anchor in place.  Extremities: no cyanosis, clubbing, rash. Warm no edema  Neuro: alert & oriented x 3. No focal deficits. Moves all 4 without problem   ASSESSMENT AND PLAN:   1. Nausea/chest pain - almost certainly GERD  - now resolved - discussed dietary discretion  - continue PPI  2. Chronic systolic HFr EF due to iCM - Echo 05/19/20 EF 20-25% RV mildly HK. moderate AS  Mean gradient 13 AVA 1.2 cm2 DI 0.30 - s/p HM-III VAD + bioprosthetic AVR 04/06/21 - Episode of cardiogenic shock and anterior ST elevation 12/23. Cath with patent LAD stent. Cardiac CT suggestive of clot on aortic valve. Felt to be possible embolic event  - Continues to do very well. NYHA I - Volume Ok - Continue Toprol  12.5 daily -  Continue eplerenone  50 mg daily   3. HM-3 LVAD implant - VAD interrogated personally. Parameters stable.. - Ramp echo 12/30/22: LVEF < 20% LVIDd 6.1cm. Cannula well placed. RV mildly HK. AoV not opening. No AI. Septum midline at 5800. No change in speed - ASA resumed due to previous thrombotic episode off AoV - INR 2.2 Goal 2.5-3.0 due to clotting on AoV and possible coronary embolism 12/23 Discussed warfarin dosing with PharmD personally. - LDH 201 - Hgb 14.8 (improved after IV iron) - MAPs ok - DL ok   4. VT/VF 12/23.  - No recent events  5. Acute upper GI bleed 9/24 with iron-deficiency anemia - 02/06/23 underwent EGD showing 1 duodenal AVM which was treated APC - Hgb improved wit IV iron in 12/24. - Hgb 14.8  6. Severe low-flow aortic stenosis s/p AVR - s/p VAD/bioprosthetic AVR - No change   7. CAD  - s/p anterior STEMI (12/20). LHC showed chronically occluded RCA (with L>>R collaterals) and thrombotic occlusion of proximal LAD. Underwent PCI of LAD. - Now s/p VAD - Recurrent anterior STEMI in 12/23 Cath 12/23 LAD stent patent. Felt to be embolic from aortic valve - Continue atorvastatin  80.  - Continue ASA 81 with previous thrombotic episode off AoV - No s/s angina  Continue exercise program    8. Paroxysmal Atrial Fibrillation w/ RVR - Remains in NSR. Off amio   9. LUL Bronchogenic CA - h/o tobacco use - Has seen Dr. Kerrin.  - s/p XRT 5/24 - continue to follow with oncology  10. Ascending aortic aneurysm - 4.1 cm on CT 8/24. - yearly f/u - no changes  .I spent a total of 42 minutes today: 1) reviewing the patient's medical records including previous charts, labs and recent notes from other providers; 2) examining the patient and counseling them on their medical issues/explaining the plan of care; 3) adjusting meds as needed and 4) ordering lab work or other needed tests.    Toribio Fuel, MD  11:33 AM

## 2024-01-22 ENCOUNTER — Ambulatory Visit (HOSPITAL_COMMUNITY): Payer: Self-pay | Admitting: Pharmacist

## 2024-01-22 LAB — POCT INR: INR: 2.3 (ref 2.0–3.0)

## 2024-01-29 ENCOUNTER — Ambulatory Visit (HOSPITAL_COMMUNITY): Payer: Self-pay | Admitting: Pharmacist

## 2024-01-29 LAB — POCT INR: INR: 2.9 (ref 2.0–3.0)

## 2024-01-30 ENCOUNTER — Encounter (HOSPITAL_COMMUNITY): Admitting: Internal Medicine

## 2024-02-02 ENCOUNTER — Encounter (INDEPENDENT_AMBULATORY_CARE_PROVIDER_SITE_OTHER): Admitting: Ophthalmology

## 2024-02-02 DIAGNOSIS — H35712 Central serous chorioretinopathy, left eye: Secondary | ICD-10-CM

## 2024-02-02 DIAGNOSIS — H43813 Vitreous degeneration, bilateral: Secondary | ICD-10-CM

## 2024-02-02 DIAGNOSIS — I10 Essential (primary) hypertension: Secondary | ICD-10-CM

## 2024-02-02 DIAGNOSIS — H353132 Nonexudative age-related macular degeneration, bilateral, intermediate dry stage: Secondary | ICD-10-CM | POA: Diagnosis not present

## 2024-02-02 DIAGNOSIS — H35033 Hypertensive retinopathy, bilateral: Secondary | ICD-10-CM | POA: Diagnosis not present

## 2024-02-05 ENCOUNTER — Ambulatory Visit (HOSPITAL_COMMUNITY): Payer: Self-pay | Admitting: Pharmacist

## 2024-02-05 LAB — POCT INR: INR: 3.6 — AB (ref 2.0–3.0)

## 2024-02-12 ENCOUNTER — Ambulatory Visit (HOSPITAL_COMMUNITY): Payer: Self-pay | Admitting: Pharmacist

## 2024-02-12 LAB — POCT INR: INR: 3.1 — AB (ref 2.0–3.0)

## 2024-02-19 ENCOUNTER — Ambulatory Visit (HOSPITAL_COMMUNITY): Payer: Self-pay | Admitting: Pharmacist

## 2024-02-19 DIAGNOSIS — Z95811 Presence of heart assist device: Secondary | ICD-10-CM

## 2024-02-19 DIAGNOSIS — Z7901 Long term (current) use of anticoagulants: Secondary | ICD-10-CM

## 2024-02-19 LAB — POCT INR: INR: 3.6 — AB (ref 2.0–3.0)

## 2024-02-23 ENCOUNTER — Other Ambulatory Visit (HOSPITAL_COMMUNITY): Payer: Self-pay | Admitting: *Deleted

## 2024-02-23 DIAGNOSIS — I5022 Chronic systolic (congestive) heart failure: Secondary | ICD-10-CM

## 2024-02-23 DIAGNOSIS — Z7901 Long term (current) use of anticoagulants: Secondary | ICD-10-CM

## 2024-02-23 DIAGNOSIS — Z95811 Presence of heart assist device: Secondary | ICD-10-CM

## 2024-02-24 ENCOUNTER — Ambulatory Visit (HOSPITAL_COMMUNITY): Payer: Self-pay | Admitting: Pharmacist

## 2024-02-24 ENCOUNTER — Ambulatory Visit (HOSPITAL_COMMUNITY)
Admission: RE | Admit: 2024-02-24 | Discharge: 2024-02-24 | Disposition: A | Discharge status: Home / Self Care | Source: Ambulatory Visit | Attending: Internal Medicine | Admitting: Internal Medicine

## 2024-02-24 ENCOUNTER — Other Ambulatory Visit (HOSPITAL_COMMUNITY): Payer: Self-pay | Admitting: *Deleted

## 2024-02-24 VITALS — BP 111/93 | HR 73 | Wt 212.8 lb

## 2024-02-24 DIAGNOSIS — Z7901 Long term (current) use of anticoagulants: Secondary | ICD-10-CM | POA: Diagnosis not present

## 2024-02-24 DIAGNOSIS — I480 Paroxysmal atrial fibrillation: Secondary | ICD-10-CM | POA: Diagnosis not present

## 2024-02-24 DIAGNOSIS — I5022 Chronic systolic (congestive) heart failure: Secondary | ICD-10-CM

## 2024-02-24 DIAGNOSIS — I472 Ventricular tachycardia, unspecified: Secondary | ICD-10-CM

## 2024-02-24 DIAGNOSIS — Z95811 Presence of heart assist device: Secondary | ICD-10-CM

## 2024-02-24 DIAGNOSIS — Z952 Presence of prosthetic heart valve: Secondary | ICD-10-CM

## 2024-02-24 DIAGNOSIS — D500 Iron deficiency anemia secondary to blood loss (chronic): Secondary | ICD-10-CM

## 2024-02-24 LAB — BASIC METABOLIC PANEL WITH GFR
Anion gap: 9 (ref 5–15)
BUN: 12 mg/dL (ref 8–23)
CO2: 26 mmol/L (ref 22–32)
Calcium: 8.8 mg/dL — ABNORMAL LOW (ref 8.9–10.3)
Chloride: 100 mmol/L (ref 98–111)
Creatinine, Ser: 0.88 mg/dL (ref 0.61–1.24)
GFR, Estimated: >60 mL/min (ref >60)
Glucose, Bld: 113 mg/dL — ABNORMAL HIGH (ref 70–99)
Potassium: 4.2 mmol/L (ref 3.5–5.1)
Sodium: 135 mmol/L (ref 135–145)

## 2024-02-24 LAB — CBC
HCT: 42.3 % (ref 39.0–52.0)
Hemoglobin: 13.6 g/dL (ref 13.0–17.0)
MCH: 28.4 pg (ref 26.0–34.0)
MCHC: 32.2 g/dL (ref 30.0–36.0)
MCV: 88.3 fL (ref 80.0–100.0)
Platelets: 154 K/uL (ref 150–400)
RBC: 4.79 MIL/uL (ref 4.22–5.81)
RDW: 14.7 % (ref 11.5–15.5)
WBC: 6.8 K/uL (ref 4.0–10.5)
nRBC: 0 % (ref 0.0–0.2)

## 2024-02-24 LAB — PROTIME-INR
INR: 3.1 — ABNORMAL HIGH (ref 0.8–1.2)
Prothrombin Time: 33.1 s — ABNORMAL HIGH (ref 11.4–15.2)

## 2024-02-24 LAB — LACTATE DEHYDROGENASE: LDH: 164 U/L (ref 98–192)

## 2024-02-24 NOTE — Progress Notes (Signed)
 Patient presents to VAD Clinic for 2 month f/u. Reports no problems with VAD equipment or concerns with drive line.  Pt walked into clinic independently. Denies lightheadedness, dizziness, falls, shortness of breath, and signs of bleeding. Enjoys going to the gym several times a week.   Taking all medication as prescribed. Takes Torsemide  1-2 times per month.  Occasional speed drops to 5600 noted on interrogation around 0300 some mornings, that quickly resolve. Will plan to get RAMP echo with next visit per Dr Cherrie.   Small tear noted on drive line below anchor repaired with rescue tape.   Vital Signs:    Ht: 6' Doppler Pressure: 96 Automatic BP: 111/93 (98) HR: 73 SPO2: 96% on RA   Weight: 212.8 w/ ept Last weight: 212 lb w/ eqt Ht: 6'   VAD Interrogation: Speed: 5800 Flow: 4.5 Power: 4.9 w    PI: 5.1 Alarms: none Events: 50-100 daily  Fixed speed: 5800 Low speed limit: 5500  Primary controller: Back up battery due for replacement in 16 months Secondary controller:Back up battery due for replacement in 31 months -- not present   I reviewed the LVAD parameters from today and compared the results to the patient's prior recorded data. LVAD interrogation was NEGATIVE for significant power changes, NEGATIVE for clinical alarms and STABLE for PI events/speed drops. No programming changes were made and pump is functioning within specified parameters. Pt is performing daily controller and system monitor self tests along with completing weekly and monthly maintenance for LVAD equipment.   LVAD equipment check completed and is in good working order. Back-up equipment  present and charged at today's visit.   Annual Equipment Maintenance on UBC/PM was performed 03/11/23.    Exit Site Care:  Existing VAD dressing dry and intact. Anchor intact and applied accurately. Pt denies any redness, tenderness, drainage, or pain at exit site. Neighbor continues to perform weekly dressing  changes.   Existing VAD dressing removed and site care performed using sterile technique. Drive line exit site cleaned with Chlora prep applicators x 2, allowed to dry, and Sorbaview dressing with Silverlon patch applied. Exit site healed and incorporated, the velour is fully implanted at exit site. No redness, tenderness, drainage, foul odor or rash noted. Small scab at exit site removed with cleansing. Cath grip anchor applied. Pt denies fever or chills.Provided patient with 8 weekly dressing kits.     Device: N/A  (removed due to infection 12/04/20)  BP & Labs:  Doppler 96- Doppler is reflecting modified systolic   Hgb 13.6 - No S/S of bleeding. Specifically denies melena/BRBPR or nosebleeds.   LDH 164 with established baseline of 135 - 309. Denies tea-colored urine. No power elevations noted on interrogation.    Patient Instructions: No medication changes Coumadin  dosing per Jaun Bash PharmD Return to see Dr. Bensimhon in 2 months w/ RAMP echo  Isaiah Knoll RN VAD Coordinator  Office: [phone]  24/7 Pager: [phone]

## 2024-02-24 NOTE — Patient Instructions (Addendum)
 No medication changes Coumadin  dosing per Jaun Bash PharmD Return to see Dr. Cherrie in 2 months w/ RAMP echo, 3 year Intermacs, and annual maintenance. Please bring your equipment (black bag, battery charger, and MPU) for maintenance

## 2024-02-25 ENCOUNTER — Encounter (HOSPITAL_COMMUNITY): Admitting: Internal Medicine

## 2024-02-26 ENCOUNTER — Ambulatory Visit (HOSPITAL_COMMUNITY): Payer: Self-pay | Admitting: Pharmacist

## 2024-02-26 DIAGNOSIS — Z7901 Long term (current) use of anticoagulants: Secondary | ICD-10-CM

## 2024-02-26 DIAGNOSIS — Z95811 Presence of heart assist device: Secondary | ICD-10-CM

## 2024-02-26 LAB — POCT INR: INR: 3.1 — AB (ref 2.0–3.0)

## 2024-03-01 NOTE — Progress Notes (Signed)
 LVAD Follow-up Clinic Note   PCP: Mercer Clotilda SAUNDERS, MD HF MD: DB   HPI:  Brandon [name] is a 68 year old male with history of tobacco use, CAD s/p previous anterior MI, severe systolic HF s/p HM-3 VAD 04/06/21, severe AS s/p AVR, endocarditis.    Admitted 12/20 with anterior ST elevation MI. Cath with chronically occluded RCA (L>>R collaterals) and thrombotic occlusion of proximal LAD treated with PCI. Echo w/ reduced LVEF 30-35% w/ apical aneurysm. RV ok. Post cath, he required milrinone  for cardiogenic shock. Subsequently underwent Barostim.   Underwent outpatient [facility] 11/29/20 as part of VAD w/u. Post procedure, developed severe rigors and fever. Admitted to ICU Blood Cx + strep. 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  Likely low-flow low gradient AS. Underwent ICD extraction..    Admitted for cardiogenic shock in 10/22.  He underwent placement of HM3 VAD and aortic valve replacement with 25 mm Edwards Inspiris Resilia valve on 04/06/21. Surgical path came back 11/28 with evidence of possible acute aortic valve endocarditis. 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 Discharged 05/09/21.   He had a complicated month of December.  On 04/22/2022 he presented with shortness of breath and diaphoresis.  Found to be in ventricular fibrillation.  Was defibrillated.  Then on Christmas day he presented back with chest pain, nausea, and shortness of breath.  ECG showed ST elevation.  Catheterization showed stable CAD. Cardiac CT suspicious for clot on AoV. INR increased to 2.5-3.0 range.   Had PET 1/24 which showed increased size of the spiculated LUL pulmonary nodule which is hypermetabolic concerning for primary bronchogenic carcinoma. Has seen Dr. Kerrin and referred  for stereotactic radiation without biopsy.   Admitted on 02/05/23 with anemia hgb was 10.3. INR 2.0. He was given a bolus of IVFs. Protonix  increased to 40 mg bid. GI consulted. On 9/26, underwent EGD showing 1 duodenal AVM which was treated APC. During admission, episode of hypotension requiring additional 500cc bolus IVF. Epleronone decreases to 25mg  daily at discharge. Intermittent runs of VT    Follow up for Heart Failure/LVAD:  Here for routine f/u. Feels great. Exercising routinely. No CP or SOB. Denies orthopnea or PND. No fevers, chills or problems with driveline. No bleeding, melena or neuro symptoms. No VAD alarms. Taking all meds as prescribed.    LVAD Documentation    02/24/2024  Device Info  LVAD Type: Heartmate III  Date of Implant: 04/06/2021  Therapy Type: Destination Therapy      02/24/2024  Vitals  Heart Rate: 73 BPM  Automatic BP: 111/93  Doppler MAP: 96 mmHg  SpO2: 96 %    Last 3 Weights Weight Weight  02/24/2024 96.525 kg 212 lb 12.8 oz  01/19/2024 96.163 kg 212 lb  01/15/2024 95.346 kg 210 lb 3.2 oz       02/24/2024  LVAD Paramaters  Speed: 5800 RPM  Flow: 5 LPM  PI: 5  Power: 5 Watts  Hematocrit: 42 %  Alarms: none  Events: 50 - 100  Last Speed Change Date: 04/16/2021  Last Ramp Echo Date: 12/30/2022  Last Right Heart Cath Date: 05/06/2022  Bleeding History: Yes  Type of Bleeding Hx: GI Bleed  Type of Dressing: Weekly  Annual Maintenance Date: 04/07/2023    Labs    Units 02/26/24 0000 02/24/24 1000  02/19/24 0000 01/22/24 0000 01/19/24 0932 12/04/23 0000 11/28/23 1118  INR  3.1* 3.1* 3.6*   < > 2.4*   < > 2.8*  LDH U/L  --  164  --   --  201*  --  178  HGB g/dL  --  86.3  --   --  85.1  --  14.3  CREATININE mg/dL  --  9.11  --   --  9.20  --  1.13   < > = values in this interval not displayed.           Past Medical History:  Diagnosis Date   AICD (automatic cardioverter/defibrillator) present 08/31/2019   Ankylosing spondylitis (HCC)     Arthritis    BENIGN PROSTATIC HYPERTROPHY 06/07/2008   CHF (congestive heart failure) (HCC)    COLONIC POLYPS, HX OF 06/07/2008   Coronary artery disease    Depression    H/O hiatal hernia    Heart failure (HCC)    History of radiation therapy    Left Lung- 08/06/22-08/16/22- Dr. Lynwood Nasuti   HYPERLIPIDEMIA 06/07/2008   HYPERTENSION 06/07/2008   Myocardial infarction [facility]) 2005   NSTEMI, s/p LAD stent   NEPHROLITHIASIS, HX OF 06/07/2008   STEMI (ST elevation myocardial infarction) (HCC) 04/27/2019    Current Outpatient Medications  Medication Sig Dispense Refill   acetaminophen  (TYLENOL ) 500 MG tablet Take 500-1,000 mg by mouth every 6 (six) hours as needed (for pain).     atorvastatin  (LIPITOR ) 80 MG tablet TAKE 1 TABLET BY MOUTH ONCE DAILY AT 6PM. 90 tablet 3   Carboxymethylcellulose Sodium (ARTIFICIAL TEARS OP) Place 1 drop into both eyes daily as needed (for dryness or irritation).     diphenhydrAMINE  (BENADRYL ) 25 mg capsule Take 25 mg by mouth every 8 (eight) hours as needed.     eplerenone  (INSPRA ) 25 MG tablet Take 2 tablets (50 mg total) by mouth in the morning. 30 tablet 5   ezetimibe  (ZETIA ) 10 MG tablet TAKE 1 TABLET BY MOUTH DAILY 90 tablet 3   gabapentin  (NEURONTIN ) 300 MG capsule Take 1 capsule (300 mg total) by mouth 3 (three) times daily. 270 capsule 3   metoprolol  succinate (TOPROL -XL) 25 MG 24 hr tablet Take 1 tablet (25 mg total) by mouth daily. 90 tablet 3   nystatin  (MYCOSTATIN /NYSTOP ) powder Apply 1 Application topically 3 (three) times daily. 15 g 2   pantoprazole  (PROTONIX ) 40 MG tablet TAKE 1 TABLET BY MOUTH TWICE A DAY 180 tablet 2   sertraline  (ZOLOFT ) 50 MG tablet Take 1 tablet (50 mg total) by mouth daily. Pt taking 50 mg daily 90 tablet 3   tamsulosin  (FLOMAX ) 0.4 MG CAPS capsule Take 1 capsule (0.4 mg total) by mouth daily. 90 capsule 3   traMADol  (ULTRAM ) 50 MG tablet TAKE 1 TO 2 TABLETS BY MOUTH 2 TIMES A DAY 120 tablet 1   warfarin (COUMADIN )  2.5 MG tablet TAKE 7.5 MG (3 TABLETS) EVERY MONDAY/FRIDAY AND 5 MG (2 TABLETS) ALL OTHER DAYS OR AS INSTRUCTED BY LVAD CLINIC. 180 tablet 11   ZYRTEC ALLERGY 10 MG tablet Take 10 mg by mouth daily.     amoxicillin  (AMOXIL ) 500 MG capsule TAKE 4 CAPSULES (2,000 MG TOTAL) BY MOUTH AS NEEDED. 30-60 MINUTES BEFORE DENTAL PROCEDURE (Patient not taking: Reported on 02/24/2024) 4 capsule 3   potassium chloride  SA (KLOR-CON  M20) 20 MEQ tablet Take 1 tablet (20 mEq total) by mouth as needed. (Patient not taking: Reported on 02/24/2024) 30 tablet 3  torsemide  (DEMADEX ) 20 MG tablet TAKE 1 TABLET BY MOUTH AS NEEDED (Patient not taking: Reported on 02/24/2024) 90 tablet 1   No current facility-administered medications for this encounter.    Patient has no known allergies.    Vital Signs:     See above   Physical Exam: General:  NAD.  HEENT: normal  Neck: supple. JVP not elevated.  Carotids 2+ bilat; no bruits. No lymphadenopathy or thryomegaly appreciated. Cor: LVAD hum.  Lungs: Clear. Abdomen: soft, nontender, non-distended. No hepatosplenomegaly. No bruits or masses. Good bowel sounds. Driveline site clean. Anchor in place.  Extremities: no cyanosis, clubbing, rash. Warm no edema  Neuro: alert & oriented x 3. No focal deficits. Moves all 4 without problem    ASSESSMENT AND PLAN:  1. Chronic systolic HFr EF due to iCM - Echo 05/19/20 EF 20-25% RV mildly HK. moderate AS  Mean gradient 13 AVA 1.2 cm2 DI 0.30 - s/p HM-III VAD + bioprosthetic AVR 04/06/21 - Episode of cardiogenic shock and anterior ST elevation 12/23. Cath with patent LAD stent. Cardiac CT suggestive of clot on aortic valve. Felt to be possible embolic event  - Doing great NYHA I - Volume ok  - Continue Toprol  12.5 daily - Continue eplerenone  50 mg daily   2. HM-3 LVAD implant - VAD interrogated personally. Parameters stable. - Ramp echo 12/30/22: LVEF < 20% LVIDd 6.1cm. Cannula well placed. RV mildly HK. AoV not opening. No AI.  Septum midline at 5800. No change in speed - ASA resumed due to previous thrombotic episode off AoV - INR 3.1  Goal 2.5-3.0 due to clotting on AoV and possible coronary embolism 12/23 Discussed warfarin dosing with PharmD personally. - LDH 164 - Hgb 13.6 - MAPs ok - DL ok   3. VT/VF 12/23.  - No recent events  4. H/o upper GI bleed 9/24 with iron-deficiency anemia - 02/06/23 underwent EGD showing 1 duodenal AVM which was treated APC - Hgb improved wit IV iron in 12/24. - Hgb 13.6  5. Severe low-flow aortic stenosis s/p AVR - s/p VAD/bioprosthetic AVR - No change   6. CAD  - s/p anterior STEMI (12/20). LHC chronically occluded RCA (with L>>R collaterals) and thrombotic occlusion of proximal LAD. Underwent PCI of LAD. - Now s/p VAD - Recurrent anterior STEMI in 12/23 Cath 12/23 LAD stent patent. Felt to be embolic from aortic valve - Continue atorvastatin  80.  - Continue ASA 81 with previous thrombotic episode off AoV - No s/s angina  7. Paroxysmal Atrial Fibrillation w/ RVR - Remains in NSR. Off amio  9. LUL Bronchogenic CA - h/o tobacco use - Has seen Dr. Kerrin.  - s/p XRT 5/24 - continue to follow with oncology  10. Ascending aortic aneurysm - 4.1 cm on CT 8/24. - yearly f/u  I spent a total of 42 minutes today: 1) reviewing the patient's medical records including previous charts, labs and recent notes from other providers; 2) examining the patient and counseling them on their medical issues/explaining the plan of care; 3) adjusting meds as needed and 4) ordering lab work or other needed tests.     Toribio Fuel, MD  2:22 PM

## 2024-03-04 ENCOUNTER — Other Ambulatory Visit (HOSPITAL_COMMUNITY): Payer: Self-pay | Admitting: Internal Medicine

## 2024-03-04 ENCOUNTER — Ambulatory Visit (HOSPITAL_COMMUNITY): Payer: Self-pay | Admitting: Pharmacist

## 2024-03-04 DIAGNOSIS — Z7901 Long term (current) use of anticoagulants: Secondary | ICD-10-CM

## 2024-03-04 DIAGNOSIS — Z95811 Presence of heart assist device: Secondary | ICD-10-CM

## 2024-03-04 LAB — POCT INR: INR: 3.2 — AB (ref 2.0–3.0)

## 2024-03-04 MED ORDER — WARFARIN SODIUM 2.5 MG PO TABS: ORAL_TABLET | ORAL | 3 refills | Status: AC

## 2024-03-05 ENCOUNTER — Telehealth: Payer: Self-pay

## 2024-03-05 NOTE — Telephone Encounter (Signed)
 Refill Request.

## 2024-03-05 NOTE — Telephone Encounter (Signed)
 Pt states that he is having issue with the pharmacy and his coverage of his warfarin. He would like to speak to Nason (by name).

## 2024-03-11 LAB — POCT INR: INR: 2.7 (ref 2.0–3.0)

## 2024-03-12 ENCOUNTER — Ambulatory Visit (HOSPITAL_COMMUNITY): Payer: Self-pay | Admitting: Pharmacist

## 2024-03-12 DIAGNOSIS — Z7901 Long term (current) use of anticoagulants: Secondary | ICD-10-CM

## 2024-03-12 DIAGNOSIS — Z95811 Presence of heart assist device: Secondary | ICD-10-CM

## 2024-03-18 ENCOUNTER — Ambulatory Visit (HOSPITAL_COMMUNITY): Payer: Self-pay | Admitting: Pharmacist

## 2024-03-18 DIAGNOSIS — Z7901 Long term (current) use of anticoagulants: Secondary | ICD-10-CM

## 2024-03-18 DIAGNOSIS — Z95811 Presence of heart assist device: Secondary | ICD-10-CM

## 2024-03-18 LAB — POCT INR: INR: 2.5 (ref 2.0–3.0)

## 2024-03-24 ENCOUNTER — Other Ambulatory Visit (HOSPITAL_COMMUNITY): Payer: Self-pay

## 2024-03-24 ENCOUNTER — Other Ambulatory Visit: Payer: Self-pay

## 2024-03-25 ENCOUNTER — Ambulatory Visit (HOSPITAL_COMMUNITY): Payer: Self-pay | Admitting: Pharmacist

## 2024-03-25 DIAGNOSIS — Z7901 Long term (current) use of anticoagulants: Secondary | ICD-10-CM

## 2024-03-25 DIAGNOSIS — Z95811 Presence of heart assist device: Secondary | ICD-10-CM

## 2024-03-25 LAB — POCT INR: INR: 2.9 (ref 2.0–3.0)

## 2024-03-31 ENCOUNTER — Other Ambulatory Visit: Payer: Self-pay | Admitting: Family Medicine

## 2024-03-31 DIAGNOSIS — M456 Ankylosing spondylitis lumbar region: Secondary | ICD-10-CM

## 2024-04-01 ENCOUNTER — Ambulatory Visit (HOSPITAL_COMMUNITY): Payer: Self-pay | Admitting: Pharmacist

## 2024-04-01 DIAGNOSIS — Z7901 Long term (current) use of anticoagulants: Secondary | ICD-10-CM

## 2024-04-01 DIAGNOSIS — Z95811 Presence of heart assist device: Secondary | ICD-10-CM

## 2024-04-01 LAB — POCT INR: INR: 2.7 (ref 2.0–3.0)

## 2024-04-07 ENCOUNTER — Ambulatory Visit (HOSPITAL_COMMUNITY): Payer: Self-pay | Admitting: Pharmacist

## 2024-04-07 DIAGNOSIS — Z7901 Long term (current) use of anticoagulants: Secondary | ICD-10-CM

## 2024-04-07 DIAGNOSIS — Z95811 Presence of heart assist device: Secondary | ICD-10-CM

## 2024-04-07 LAB — POCT INR
INR: 2.1 (ref 2.0–3.0)
INR: 3.1 — AB (ref 2.0–3.0)

## 2024-04-15 ENCOUNTER — Encounter (HOSPITAL_COMMUNITY): Payer: Self-pay

## 2024-04-15 ENCOUNTER — Ambulatory Visit (HOSPITAL_COMMUNITY)
Admission: RE | Admit: 2024-04-15 | Discharge: 2024-04-15 | Disposition: A | Source: Ambulatory Visit | Attending: Radiology

## 2024-04-15 ENCOUNTER — Ambulatory Visit (HOSPITAL_COMMUNITY): Payer: Self-pay

## 2024-04-15 DIAGNOSIS — C3412 Malignant neoplasm of upper lobe, left bronchus or lung: Secondary | ICD-10-CM | POA: Diagnosis present

## 2024-04-15 HISTORY — DX: Malignant (primary) neoplasm, unspecified: C80.1

## 2024-04-15 LAB — POCT INR: INR: 3 (ref 2.0–3.0)

## 2024-04-15 NOTE — Progress Notes (Signed)
 LVAD INR

## 2024-04-19 ENCOUNTER — Other Ambulatory Visit (HOSPITAL_COMMUNITY): Payer: Self-pay

## 2024-04-19 DIAGNOSIS — Z95811 Presence of heart assist device: Secondary | ICD-10-CM

## 2024-04-19 DIAGNOSIS — Z7901 Long term (current) use of anticoagulants: Secondary | ICD-10-CM

## 2024-04-20 ENCOUNTER — Ambulatory Visit (HOSPITAL_COMMUNITY)
Admission: RE | Admit: 2024-04-20 | Discharge: 2024-04-20 | Disposition: A | Source: Ambulatory Visit | Attending: Internal Medicine | Admitting: Internal Medicine

## 2024-04-20 DIAGNOSIS — I5022 Chronic systolic (congestive) heart failure: Secondary | ICD-10-CM

## 2024-04-20 DIAGNOSIS — Z95811 Presence of heart assist device: Secondary | ICD-10-CM

## 2024-04-20 LAB — ECHOCARDIOGRAM LIMITED: Est EF: <20

## 2024-04-20 NOTE — Patient Instructions (Signed)
 No medication changes Coumadin  dosing per Jaun Bash PharmD Return to see Dr. Bensimhon in 2 months

## 2024-04-20 NOTE — Progress Notes (Signed)
 Speed  Flow  PI  Power  LVIDD  AI  Aortic opening MR  TR  Septum  RV  VTI (>18cm)  5800 5 3.6 4.9 6.5 none 0/5 trivial trivial midline mild  9.3  5900  5.1 5 4.9 6.5 none 0/5 trivial trivial Pulling in  mild 10.6  6000  5.2 5.4 5.2 6.5 none 0/5 none trivial Pulling in  mild 12.7                                             Doppler MAP: 110 Auto cuff BP: 96/77(85)   Ramp ECHO performed at bedside per Dr Cherrie  At completion of ramp study, patients primary controller programmed:  Fixed speed: 5800 Low speed limit: 5500    Lauraine Ip RN, VAD Coordinator 24/7 pager [phone]

## 2024-04-20 NOTE — Progress Notes (Signed)
 LVAD Follow-up Clinic Note   PCP: Mercer Clotilda SAUNDERS, MD HF MD: DB   HPI:  Brandon [name] is a 68 year old male with history of tobacco use, CAD s/p previous anterior MI, severe systolic HF s/p HM-3 VAD 04/06/21, severe AS s/p AVR, endocarditis.    Admitted 12/20 with anterior ST elevation MI. Cath with chronically occluded RCA (L>>R collaterals) and thrombotic occlusion of proximal LAD treated with PCI. Echo w/ reduced LVEF 30-35% w/ apical aneurysm. RV ok. Post cath, he required milrinone  for cardiogenic shock. Subsequently underwent Barostim.   Underwent outpatient [facility] 11/29/20 as part of VAD w/u. Post procedure, developed severe rigors and fever. Admitted to ICU Blood Cx + strep. 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  Likely low-flow low gradient AS. Underwent ICD extraction..    Admitted for cardiogenic shock in 10/22.  He underwent placement of HM3 VAD and aortic valve replacement with 25 mm Edwards Inspiris Resilia valve on 04/06/21. Surgical path came back 11/28 with evidence of possible acute aortic valve endocarditis. 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 Discharged 05/09/21.   He had a complicated month of December.  On 04/22/2022 he presented with shortness of breath and diaphoresis.  Found to be in ventricular fibrillation.  Was defibrillated.  Then on Christmas day he presented back with chest pain, nausea, and shortness of breath.  ECG showed ST elevation.  Catheterization showed stable CAD. Cardiac CT suspicious for clot on AoV. INR increased to 2.5-3.0 range.   Had PET 1/24 which showed increased size of the spiculated LUL pulmonary nodule which is hypermetabolic concerning for primary bronchogenic carcinoma. Has seen Dr. Kerrin and referred  for stereotactic radiation without biopsy.   Admitted on 02/05/23 with anemia hgb was 10.3. INR 2.0. He was given a bolus of IVFs. Protonix  increased to 40 mg bid. GI consulted. On 9/26, underwent EGD showing 1 duodenal AVM which was treated APC. During admission, episode of hypotension requiring additional 500cc bolus IVF. Epleronone decreases to 25mg  daily at discharge. Intermittent runs of VT    Follow up for Heart Failure/LVAD:  Here for routine f/u. Feels great. Exercising routinely. Takes torsemide  as needed. Denies orthopnea or PND. No fevers, chills or problems with driveline. No bleeding, melena or neuro symptoms. No VAD alarms. Taking all meds as prescribed.     LVAD Documentation    04/20/2024  Device Info  LVAD Type: Heartmate III  Date of Implant: 04/06/2021      04/20/2024  Vitals  Heart Rate: 65 BPM  Automatic BP: 96/77  Doppler MAP: 110 mmHg  SpO2: 96 %    Last 3 Weights Weight Weight  04/20/2024 96.707 kg 213 lb 3.2 oz  02/24/2024 96.525 kg 212 lb 12.8 oz  01/19/2024 96.163 kg 212 lb       04/20/2024  LVAD Paramaters  Speed: 5800 RPM  Flow: 5 LPM  PI: 3  Power: 5 Watts  Hematocrit: 42 %  Alarms: none  Events: 50-60  Last Speed Change Date: 04/16/2021  Last Ramp Echo Date: 04/20/2024  Last Right Heart Cath Date: 05/06/2022  Bleeding History: Yes  Type of Bleeding Hx: GI Bleed  Type of Dressing: Weekly  Annual Maintenance Date: 04/07/2023    Labs    Units 04/15/24 0000 04/07/24 0000 04/01/24 0000 02/26/24 0000 02/24/24 1000  01/22/24 0000 01/19/24 0932 12/04/23 0000 11/28/23 1118  INR  3.0 3.1*  2.1 2.7   < > 3.1*   < > 2.4*   < > 2.8*  LDH U/L  --   --   --   --  164  --  201*  --  178  HGB g/dL  --   --   --   --  86.3  --  14.8  --  14.3  CREATININE mg/dL  --   --   --   --  9.11  --  0.79  --  1.13   < > = values in this interval not displayed.           Past Medical History:  Diagnosis Date   AICD (automatic cardioverter/defibrillator)  present 08/31/2019   Ankylosing spondylitis (HCC)    Arthritis    BENIGN PROSTATIC HYPERTROPHY 06/07/2008   CHF (congestive heart failure) (HCC)    COLONIC POLYPS, HX OF 06/07/2008   Coronary artery disease    Depression    H/O hiatal hernia    Heart failure (HCC)    History of radiation therapy    Left Lung- 08/06/22-08/16/22- Dr. Lynwood Nasuti   HYPERLIPIDEMIA 06/07/2008   HYPERTENSION 06/07/2008   lung ca    Myocardial infarction [facility]) 2005   NSTEMI, s/p LAD stent   NEPHROLITHIASIS, HX OF 06/07/2008   STEMI (ST elevation myocardial infarction) (HCC) 04/27/2019    Current Outpatient Medications  Medication Sig Dispense Refill   acetaminophen  (TYLENOL ) 500 MG tablet Take 500-1,000 mg by mouth every 6 (six) hours as needed (for pain).     atorvastatin  (LIPITOR ) 80 MG tablet TAKE 1 TABLET BY MOUTH ONCE DAILY AT 6PM. 90 tablet 3   Carboxymethylcellulose Sodium (ARTIFICIAL TEARS OP) Place 1 drop into both eyes daily as needed (for dryness or irritation).     eplerenone  (INSPRA ) 25 MG tablet Take 2 tablets (50 mg total) by mouth in the morning. 30 tablet 5   ezetimibe  (ZETIA ) 10 MG tablet TAKE 1 TABLET BY MOUTH DAILY 90 tablet 3   gabapentin  (NEURONTIN ) 300 MG capsule Take 1 capsule (300 mg total) by mouth 3 (three) times daily. 270 capsule 3   metoprolol  succinate (TOPROL -XL) 25 MG 24 hr tablet Take 1 tablet (25 mg total) by mouth daily. 90 tablet 3   pantoprazole  (PROTONIX ) 40 MG tablet TAKE 1 TABLET BY MOUTH TWICE A DAY 180 tablet 2   potassium chloride  SA (KLOR-CON  M20) 20 MEQ tablet Take 1 tablet (20 mEq total) by mouth as needed. 30 tablet 3   sertraline  (ZOLOFT ) 50 MG tablet Take 1 tablet (50 mg total) by mouth daily. Pt taking 50 mg daily 90 tablet 3   tamsulosin  (FLOMAX ) 0.4 MG CAPS capsule Take 1 capsule (0.4 mg total) by mouth daily. 90 capsule 3   torsemide  (DEMADEX ) 20 MG tablet TAKE 1 TABLET BY MOUTH AS NEEDED 90 tablet 1   traMADol  (ULTRAM ) 50 MG tablet TAKE 1 TO 2  TABLETS BY MOUTH 2 TIMES A DAY 120 tablet 0   warfarin (COUMADIN ) 2.5 MG tablet TAKE 5 MG (2 TABLETS) EVERY MONDAY AND 7.5 MG (2 TABLETS) ALL OTHER DAYS OR AS INSTRUCTED BY LVAD CLINIC. 320 tablet 3   ZYRTEC ALLERGY 10 MG tablet Take 10 mg by mouth daily.     amoxicillin  (AMOXIL ) 500 MG capsule TAKE 4 CAPSULES (2,000 MG TOTAL) BY MOUTH AS NEEDED. 30-60 MINUTES BEFORE DENTAL PROCEDURE (Patient not taking: Reported on 04/20/2024) 4  capsule 3   diphenhydrAMINE  (BENADRYL ) 25 mg capsule Take 25 mg by mouth every 8 (eight) hours as needed. (Patient not taking: Reported on 04/20/2024)     nystatin  (MYCOSTATIN /NYSTOP ) powder Apply 1 Application topically 3 (three) times daily. (Patient not taking: Reported on 04/20/2024) 15 g 2   No current facility-administered medications for this encounter.    Patient has no known allergies.    Vital Signs:     See above   Physical Exam: General:  NAD.  HEENT: normal  Neck: supple. JVP not elevated.  Carotids 2+ bilat; no bruits. No lymphadenopathy or thryomegaly appreciated. Cor: LVAD hum.  Lungs: Clear. Abdomen: soft, nontender, non-distended. No hepatosplenomegaly. No bruits or masses. Good bowel sounds. Driveline site clean. Anchor in place.  Extremities: no cyanosis, clubbing, rash. Warm no edema  Neuro: alert & oriented x 3. No focal deficits. Moves all 4 without problem    ASSESSMENT AND PLAN:  1. Chronic systolic HFr EF due to iCM - Echo 05/19/20 EF 20-25% RV mildly HK. moderate AS  Mean gradient 13 AVA 1.2 cm2 DI 0.30 - s/p HM-III VAD + bioprosthetic AVR 04/06/21 - Episode of cardiogenic shock and anterior ST elevation 12/23. Cath with patent LAD stent. Cardiac CT suggestive of clot on aortic valve. Felt to be possible embolic event  - Doing great. NYHA I - Volume ok. Takes torsemide  as needed - Continue Toprol  12.5 daily - Continue eplerenone  50 mg daily   2. HM-3 LVAD implant - VAD interrogated personally. Parameters stable. - Ramp echo  12/30/22: LVEF < 20% LVIDd 6.1cm. Cannula well placed. RV mildly HK. AoV not opening. No AI. Septum midline at 5800. No change in speed - Rampe echo today 04/20/24 EF 20% RV ok  Aortic valve opening 1/5. Septum midline 5800 pulls to left at 5900. Left at 5800 - ASA resumed due to previous thrombotic episode off AoV - INR pending  Goal 2.5-3.0 due to clotting on AoV and possible coronary embolism 12/23 Pharmd managning - LDH pending - Hgb pending - MAPs OK - DL OK  3. VT/VF 12/23.  - No recent events  4. H/o upper GI bleed 9/24 with iron-deficiency anemia - 02/06/23 underwent EGD showing 1 duodenal AVM which was treated APC - Hgb improved wit IV iron in 12/24. - Hgb pending  5. Severe low-flow aortic stenosis s/p AVR - s/p VAD/bioprosthetic AVR - No change   6. CAD  - s/p anterior STEMI (12/20). LHC chronically occluded RCA (with L>>R collaterals) and thrombotic occlusion of proximal LAD. Underwent PCI of LAD. - Now s/p VAD - Recurrent anterior STEMI in 12/23 Cath 12/23 LAD stent patent. Felt to be embolic from aortic valve - Continue atorvastatin  80.  - Continue ASA 81 with previous thrombotic episode off AoV - No s/s angina  7. Paroxysmal Atrial Fibrillation w/ RVR - Remains in NSR. Off amio  9. LUL Bronchogenic CA - h/o tobacco use - Has seen Dr. Kerrin.  - s/p XRT 5/24 - continue to follow with oncology  10. Ascending aortic aneurysm - 4.1 cm on CT 8/24. - yearly f/u  I spent a total of 38 minutes today: 1) reviewing the patient's medical records including previous charts, labs and recent notes from other providers; 2) examining the patient and counseling them on their medical issues/explaining the plan of care; 3) adjusting meds as needed and 4) ordering lab work or other needed tests.    Toribio Fuel, MD  1:54 PM

## 2024-04-20 NOTE — Progress Notes (Signed)
 Patient presents to VAD Clinic for 2 month f/u w/RAMP along with 3 yr intermacs and annual main. Reports no problems with VAD equipment or concerns with drive line.  Pt walked into clinic independently. Denies lightheadedness, dizziness, falls, shortness of breath, and signs of bleeding. Enjoys going to the gym several times a week.   Taking all medication as prescribed. Takes Torsemide  1-2 times per month.  RAMP performed today in clinic. Speed unchanged. See ramp note from clinic today.  VAD coordinator did not send pt to the lab following his visit. Lab visit placed for tomorrow for full labs.   Vital Signs:    Ht: 6' Doppler Pressure: 110 Automatic BP: 96/77 (85) HR: 65 SPO2: 96% on RA   Weight: 213.2 w/ ept Last weight: 212.8 lb w/ eqt Ht: 6'   VAD Interrogation: Speed: 5800 Flow: 5.1 Power: 4.9 w    PI: 3.4 Alarms: none Events: 50-60 daily  Fixed speed: 5800 Low speed limit: 5500  Primary controller: Back up battery due for replacement in 14 months Secondary controller:Back up battery due for replacement in 26 months    I reviewed the LVAD parameters from today and compared the results to the patient's prior recorded data. LVAD interrogation was NEGATIVE for significant power changes, NEGATIVE for clinical alarms and STABLE for PI events/speed drops. No programming changes were made and pump is functioning within specified parameters. Pt is performing daily controller and system monitor self tests along with completing weekly and monthly maintenance for LVAD equipment.   LVAD equipment check completed and is in good working order. Back-up equipment  present and charged at today's visit.   Annual Equipment Maintenance on UBC/PM was performed 04/20/24.    Exit Site Care:  Existing VAD dressing dry and intact. Anchor intact and applied accurately. Pt denies any redness, tenderness, drainage, or pain at exit site. Neighbor continues to perform weekly dressing changes. Pt  given 8 weekly dressing kits and 8 anchors.   Device: N/A  (removed due to infection 12/04/20)  BP & Labs:  Doppler 110 - Doppler is reflecting modified systolic   Hgb 13.6 - No S/S of bleeding. Specifically denies melena/BRBPR or nosebleeds.   LDH 164 with established baseline of 135 - 309. Denies tea-colored urine. No power elevations noted on interrogation.    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    2 sets of batteries ordered for the pt. Pt charged for batteries today.  Backup system controller 11 volt battery charged during visit.   3 year Intermacs follow up completed including:  Quality of Life, KCCQ-12, and [employer] trail making.   Pt completed 1100 feet during 6 minute walk.   Patient Goals: Continue working out at the gym every day  [place] Cardiomyopathy Questionnaire     04/20/2024    2:28 PM 09/19/2023   10:33 AM 03/11/2023    1:40 PM  KCCQ-12  1 a. Ability to shower/bathe Not at all limited Not at all limited Not at all limited  1 b. Ability to walk 1 block Slightly limited Slightly limited Slightly limited  1 c. Ability to hurry/jog Quite a bit limited Moderately limited Quite a bit limited  2. Edema feet/ankles/legs Never over the past 2 weeks Never over the past 2 weeks Never over the past 2 weeks  3. Limited by fatigue  Never over the past 2 weeks 1-2 times a week Less than once a week  4. Limited by dyspnea 1-2 times a week  Less than once a week  5. Sitting up / on 3+ pillows Never over the past 2 weeks Less than once a week Never over the past 2 weeks  6. Limited enjoyment of life Not limited at all Not limited at all Not limited at all  7. Rest of life w/ symptoms Completely satisfied Mostly satisfied Mostly satisfied  8 a. Participation in hobbies Did not limit at  all Did not limit at all Did not limit at all  8 b. Participation in chores Did not limit at all Did not limit at all Did not limit at all  8 c. Visiting family/friends Did not limit at all Did not limit at all Did not limit at all      Patient Instructions: No medication changes Coumadin  dosing per Jaun Bash PharmD Return to see Dr. Bensimhon in 2 months   Lauraine Ip RN VAD Coordinator  Office: [phone]  24/7 Pager: [phone]

## 2024-04-21 ENCOUNTER — Ambulatory Visit (HOSPITAL_COMMUNITY): Admission: RE | Admit: 2024-04-21 | Discharge: 2024-04-21 | Attending: Cardiology

## 2024-04-21 ENCOUNTER — Other Ambulatory Visit (HOSPITAL_COMMUNITY): Payer: Self-pay | Admitting: *Deleted

## 2024-04-21 ENCOUNTER — Ambulatory Visit (HOSPITAL_COMMUNITY): Payer: Self-pay | Admitting: Pharmacist

## 2024-04-21 DIAGNOSIS — Z7901 Long term (current) use of anticoagulants: Secondary | ICD-10-CM | POA: Insufficient documentation

## 2024-04-21 DIAGNOSIS — Z95811 Presence of heart assist device: Secondary | ICD-10-CM

## 2024-04-21 DIAGNOSIS — I5022 Chronic systolic (congestive) heart failure: Secondary | ICD-10-CM

## 2024-04-21 LAB — COMPREHENSIVE METABOLIC PANEL WITH GFR
ALT: 20 U/L (ref 0–44)
AST: 27 U/L (ref 15–41)
Albumin: 3.9 g/dL (ref 3.5–5.0)
Alkaline Phosphatase: 118 U/L (ref 38–126)
Anion gap: 7 (ref 5–15)
BUN: 16 mg/dL (ref 8–23)
CO2: 30 mmol/L (ref 22–32)
Calcium: 8.8 mg/dL — ABNORMAL LOW (ref 8.9–10.3)
Chloride: 99 mmol/L (ref 98–111)
Creatinine, Ser: 0.92 mg/dL (ref 0.61–1.24)
GFR, Estimated: >60 mL/min (ref >60)
Glucose, Bld: 110 mg/dL — ABNORMAL HIGH (ref 70–99)
Potassium: 5 mmol/L (ref 3.5–5.1)
Sodium: 136 mmol/L (ref 135–145)
Total Bilirubin: 0.9 mg/dL (ref 0.0–1.2)
Total Protein: 7.5 g/dL (ref 6.5–8.1)

## 2024-04-21 LAB — PROTIME-INR
INR: 2.8 — ABNORMAL HIGH (ref 0.8–1.2)
Prothrombin Time: 30.4 s — ABNORMAL HIGH (ref 11.4–15.2)

## 2024-04-21 LAB — CBC
HCT: 45.7 % (ref 39.0–52.0)
Hemoglobin: 14.7 g/dL (ref 13.0–17.0)
MCH: 29 pg (ref 26.0–34.0)
MCHC: 32.2 g/dL (ref 30.0–36.0)
MCV: 90.1 fL (ref 80.0–100.0)
Platelets: 160 K/uL (ref 150–400)
RBC: 5.07 MIL/uL (ref 4.22–5.81)
RDW: 15 % (ref 11.5–15.5)
WBC: 7.4 K/uL (ref 4.0–10.5)
nRBC: 0 % (ref 0.0–0.2)

## 2024-04-21 LAB — VITAMIN B12: Vitamin B-12: 246 pg/mL (ref 180–914)

## 2024-04-21 LAB — FOLATE: Folate: 5.7 ng/mL — ABNORMAL LOW (ref >5.9)

## 2024-04-21 LAB — FERRITIN: Ferritin: 397 ng/mL — ABNORMAL HIGH (ref 24–336)

## 2024-04-21 LAB — IRON AND TIBC
Iron: 45 ug/dL (ref 45–182)
Saturation Ratios: 16 % — ABNORMAL LOW (ref 17.9–39.5)
TIBC: 290 ug/dL (ref 250–450)
UIBC: 245 ug/dL

## 2024-04-21 LAB — PREALBUMIN: Prealbumin: 22 mg/dL (ref 18–38)

## 2024-04-21 LAB — MAGNESIUM: Magnesium: 2 mg/dL (ref 1.7–2.4)

## 2024-04-21 NOTE — Progress Notes (Signed)
 Brandon [name] is here today to receive results from his recent CT chest 04/15/2024.    Lung Side: Patient completed treatment to his left lung 08/16/2022.  Does the patient complain of any of the following: Pain: No Shortness of breath w/wo exertion: Yes occasionally on exertion.  Cough: No Hemoptysis: No Pain with swallowing: No Swallowing/choking concerns: No Appetite: Good  Energy Level: Good  Post radiation skin Changes: No    Additional comments if applicable:   BP (!) 118/90   Pulse (!) 56   Temp 98 F (36.7 C)   Resp 19   Wt 213 lb 3.2 oz (96.7 kg)   SpO2 98%   BMI 28.92 kg/m

## 2024-04-21 NOTE — Progress Notes (Signed)
 Radiation Oncology         [phone] ________________________________  Name: JERMEY CLOSS MRN: [id number]  Date: 04/22/2024  DOB: March 18, 1956  Follow-Up Visit Note  CC: Mercer Clotilda SAUNDERS, MD  Mercer Clotilda SAUNDERS, MD    ICD-10-CM   1. 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 A diagnosis of Lung nodule was also pertinent to this visit.   PET avid left upper lobe pulmonary nodule; s/p SBRT completed on 08/16/2022   Interval Since Last Radiation: 1 year 8 months  Indication for treatment:  Curative       Radiation treatment dates: from 08/06/22 through 08/16/22  Site/dose: Left lung - treated in total with 50 Gy delivered in 5 Fx at 10 Gy/Fx Beams/energy: 6X-FFF   Narrative:  The patient returns today for routine follow-up and to review recent imaging. He was last seen in office on 01/15/24 for a follow up visit. Patient continued to follow up with their specialists to manage their chronic conditions.    Most recent CT chest performed on 04/15/24 showed increased soft tissue thickening associated with radiation therapy in the medial left upper lobe, findings which may be due to evolving scarring, however, follow up is recommended. Scan did not indicate any evidence of metastatic disease at this time.  No other significant oncologic interval history since the patient was last seen.                    Patient reports to be doing well overall today. He denies any worsening shortness of breath, cough, chest pain, or any other changes in his breathing since we last saw him.           Allergies:  has no known allergies.  Meds: Current Outpatient Medications  Medication Sig Dispense Refill   acetaminophen  (TYLENOL ) 500 MG tablet Take 500-1,000 mg by mouth every 6 (six) hours as needed (for pain).     amoxicillin  (AMOXIL ) 500 MG capsule TAKE 4  CAPSULES (2,000 MG TOTAL) BY MOUTH AS NEEDED. 30-60 MINUTES BEFORE DENTAL PROCEDURE (Patient not taking: Reported on 04/20/2024) 4 capsule 3   atorvastatin  (LIPITOR ) 80 MG tablet TAKE 1 TABLET BY MOUTH ONCE DAILY AT 6PM. 90 tablet 3   Carboxymethylcellulose Sodium (ARTIFICIAL TEARS OP) Place 1 drop into both eyes daily as needed (for dryness or irritation).     diphenhydrAMINE  (BENADRYL ) 25 mg capsule Take 25 mg by mouth every 8 (eight) hours as needed. (Patient not taking: Reported on 04/20/2024)     eplerenone  (INSPRA ) 25 MG tablet Take 2 tablets (50 mg total) by mouth in the morning. 30 tablet 5   ezetimibe  (ZETIA ) 10 MG tablet TAKE 1 TABLET BY MOUTH DAILY 90 tablet 3   gabapentin  (NEURONTIN ) 300 MG capsule Take 1 capsule (300 mg total) by mouth 3 (three) times daily. 270 capsule 3   metoprolol  succinate (TOPROL -XL) 25 MG 24 hr tablet Take 1 tablet (25 mg total) by mouth daily. 90 tablet 3   nystatin  (MYCOSTATIN /NYSTOP ) powder Apply 1 Application topically 3 (three) times daily. (Patient not taking: Reported on 04/20/2024) 15 g 2   pantoprazole  (PROTONIX ) 40 MG tablet TAKE 1 TABLET BY MOUTH TWICE A DAY 180 tablet 2   potassium chloride  SA (KLOR-CON  M20) 20 MEQ tablet Take 1 tablet (20 mEq total) by mouth as needed.  30 tablet 3   sertraline  (ZOLOFT ) 50 MG tablet Take 1 tablet (50 mg total) by mouth daily. Pt taking 50 mg daily 90 tablet 3   tamsulosin  (FLOMAX ) 0.4 MG CAPS capsule Take 1 capsule (0.4 mg total) by mouth daily. 90 capsule 3   torsemide  (DEMADEX ) 20 MG tablet TAKE 1 TABLET BY MOUTH AS NEEDED 90 tablet 1   traMADol  (ULTRAM ) 50 MG tablet TAKE 1 TO 2 TABLETS BY MOUTH 2 TIMES A DAY 120 tablet 0   warfarin (COUMADIN ) 2.5 MG tablet TAKE 5 MG (2 TABLETS) EVERY MONDAY AND 7.5 MG (2 TABLETS) ALL OTHER DAYS OR AS INSTRUCTED BY LVAD CLINIC. 320 tablet 3   ZYRTEC ALLERGY 10 MG tablet Take 10 mg by mouth daily.     No current facility-administered medications for this encounter.    Physical  Findings: The patient is in no acute distress. Patient is alert and oriented.  weight is 213 lb 3.2 oz (96.7 kg). His temperature is 98 F (36.7 C). His blood pressure is 118/90 (abnormal) and his pulse is 56 (abnormal). His respiration is 19 and oxygen saturation is 98%.   No significant changes. Lungs are clear to auscultation bilaterally. LVAD hum auscultated. Ventricular assist device in place. No palpable cervical, supraclavicular, or submental adenopathy.   Lab Findings: Lab Results  Component Value Date   WBC 7.4 04/21/2024   HGB 14.7 04/21/2024   HCT 45.7 04/21/2024   MCV 90.1 04/21/2024   PLT 160 04/21/2024    Radiographic Findings: CT CHEST WO CONTRAST Result Date: 04/21/2024 CLINICAL DATA:  Non-small cell lung cancer, radiation therapy complete. Shortness of breath. * Tracking Code: BO * EXAM: CT CHEST WITHOUT CONTRAST TECHNIQUE: Multidetector CT imaging of the chest was performed following the standard protocol without IV contrast. RADIATION DOSE REDUCTION: This exam was performed according to the departmental dose-optimization program which includes automated exposure control, adjustment of the mA and/or kV according to patient size and/or use of iterative reconstruction technique. COMPARISON:  01/08/2024. FINDINGS: Cardiovascular: Atherosclerotic calcification of the aorta, aortic valve and coronary arteries. Aortic valve replacement. Left ventricular assist device (LVAD) in place. Ascending aorta measures 4.1 cm (coronal image 52). Heart is enlarged. No pericardial effusion. Mediastinum/Nodes: No pathologically enlarged mediastinal or axillary lymph nodes. Hilar regions are difficult to definitively evaluate without IV contrast. Esophagus is grossly unremarkable. Lungs/Pleura: Centrilobular and paraseptal emphysema. New soft tissue thickening associated with radiation therapy in the medial left upper lobe (8/37). No new pulmonary nodules. Minimal scattered pulmonary parenchymal  scarring. No pleural fluid. Airway is unremarkable. Upper Abdomen: Streak artifact from the patient's LVAD degrades image quality in the upper abdomen. Gallstones. Visualized portions of the liver, gallbladder, adrenal glands, kidneys, spleen, pancreas, stomach and bowel are otherwise grossly unremarkable. No upper abdominal adenopathy. Musculoskeletal: Osteopenia. Degenerative changes in the spine. Flowing anterior osteophytosis in the thoracic spine. IMPRESSION: 1. Increased soft tissue thickening associated with radiation therapy in the medial left upper lobe, findings which may be due to evolving scarring. Difficult to exclude recurrent disease. Recommend attention on short-term follow-up. If a more aggressive approach is desired, PET could be performed. 2. No evidence of metastatic disease. 3. Resolved left upper lobe peripheral ground-glass, indicative of an infectious/inflammatory etiology. 4. 4.1 cm ascending aortic aneurysm. Recommend annual imaging followup by CTA or MRA. This recommendation follows 2010 ACCF/AHA/AATS/ACR/ASA/SCA/SCAI/SIR/STS/SVM Guidelines for the Diagnosis and Management of Patients with Thoracic Aortic Disease. Circulation. 2010; 121: Z733-z630. Aortic aneurysm NOS (ICD10-I71.9). 5. Cholelithiasis. 6. Aortic atherosclerosis (ICD10-I70.0). Coronary  artery calcification. 7.  Emphysema (ICD10-J43.9). Electronically Signed   By: Newell Eke M.D.   On: 04/21/2024 10:33   ECHOCARDIOGRAM LIMITED Result Date: 04/20/2024    ECHOCARDIOGRAM LIMITED REPORT   Patient Name:   Brandon [name] Stenberg Date of Exam: 04/20/2024 Medical Rec #:  [id number]      Height:       72.0 in Accession #:    [id number]     Weight:       212.8 lb Date of Birth:  1955-06-06       BSA:          2.187 m Patient Age:    68 years       BP:           0/0 mmHg Patient Gender: M              HR:           77 bpm. Exam Location:  Outpatient Procedure: Limited Echo, Color Doppler and Cardiac Doppler (Both Spectral and             Color Flow Doppler were utilized during procedure). Indications:    I50.9* Heart failure (unspecified)  History:        Patient has prior history of Echocardiogram examinations, most                 recent 02/10/2023. CHF, CAD, Defibrillator; Risk                 Factors:Hypertension and Dyslipidemia. Heart Mate III.                 Aortic Valve: 25 mm Edwards Inspiris Resilia valve is present in                 the aortic position. Procedure Date: 04/06/21.  Sonographer:    Damien Senior RDCS Referring Phys: [id number] DANIEL R BENSIMHON  Sonographer Comments: RAMP echo, Bensimhon at bedside IMPRESSIONS  1. RAMP STUDY:     5800 RPM LV dimension 6.5 cm, aortic valve opens slightly once in 5 beats, no AR     5900 RPM LV dimension 6.5 cm, aortic valve opens slightly once in 5 beats, no AR. Septal bounce noted     6000 RPM LV dimension 6.5 cm, aortic valve does not appear to open, no AR, septum bows left. Left ventricular ejection fraction, by estimation, is <20%. The left ventricle has severely decreased function. The left ventricular internal cavity size was  severely dilated.  2. Right ventricular systolic function was not well visualized.  3. Moderate mitral annular calcification.  4. The aortic valve has been repaired/replaced. Aortic valve regurgitation is not visualized. There is a 25 mm Edwards Inspiris Resilia valve present in the aortic position. Procedure Date: 04/06/21. FINDINGS  Left Ventricle: RAMP STUDY: 5800 RPM LV dimension 6.5 cm, aortic valve opens slightly once in 5 beats, no AR 5900 RPM LV dimension 6.5 cm, aortic valve opens slightly once in 5 beats, no AR. Septal bounce noted 6000 RPM LV dimension 6.5 cm, aortic valve does not appear to open, no AR, septum bows left. Left ventricular ejection fraction, by estimation, is <20%. The left ventricle has severely decreased function. The left ventricular internal cavity size was severely dilated. Right Ventricle: Right ventricular systolic function was not well  visualized. Mitral Valve: Moderate mitral annular calcification. Aortic Valve: The aortic valve has been repaired/replaced. Aortic valve regurgitation is not visualized. There is a 25 mm Edwards Inspiris Resilia  valve present in the aortic position. Procedure Date: 04/06/21. Additional Comments: Spectral Doppler performed. Color Doppler performed.  Shelda Bruckner MD Electronically signed by Shelda Bruckner MD Signature Date/Time: 04/20/2024/4:05:51 PM    Final     Impression/Plan:   PET avid left upper lobe pulmonary nodule; s/p SBRT completed on 08/16/2022  We reviewed the patient's most recent imaging. CT from 04/15/2024 demonstrates increased soft tissue thickening in the medial LUL, difficult to exclude recurrent disease versus evolving scarring. We discussed options of 68-month follow up or PET imaging. Patient would like to proceed with a 53-month CT follow-up.   Of note, the previously noted LUL ground-glass lesion has resolved.  We will continue to follow closely with a CT in 3 months. He was encouraged to call with any questions or concerns in the meantime. Patient is in agreement with this stated plan.    Aortic aneurysm   Noted to be stable on imaging today. He is being followed by his cardiologist for this issue.    20 minutes of total time was spent for this patient encounter, including preparation, face-to-face counseling with the patient and coordination of care, physical exam, and documentation of the encounter. ____________________________________   Leeroy Due, PA-C   This document serves as a record of services personally performed by Leeroy Due, PA-C. It was created on her behalf by Reymundo Cartwright, a trained medical scribe. The creation of this record is based on the scribe's personal observations and the provider's statements to them. This document has been checked and approved by the attending provider.

## 2024-04-22 ENCOUNTER — Telehealth (HOSPITAL_COMMUNITY): Payer: Self-pay | Admitting: Internal Medicine

## 2024-04-22 ENCOUNTER — Telehealth (HOSPITAL_COMMUNITY): Payer: Self-pay | Admitting: Pharmacy Technician

## 2024-04-22 ENCOUNTER — Inpatient Hospital Stay
Admission: RE | Admit: 2024-04-22 | Discharge: 2024-04-22 | Payer: Self-pay | Attending: Radiation Oncology | Admitting: Radiation Oncology

## 2024-04-22 ENCOUNTER — Other Ambulatory Visit (HOSPITAL_COMMUNITY): Payer: Self-pay | Admitting: Unknown Physician Specialty

## 2024-04-22 ENCOUNTER — Encounter: Payer: Self-pay | Admitting: Radiation Oncology

## 2024-04-22 VITALS — BP 118/90 | HR 56 | Temp 98.0°F | Resp 19 | Wt 213.2 lb

## 2024-04-22 DIAGNOSIS — C3412 Malignant neoplasm of upper lobe, left bronchus or lung: Secondary | ICD-10-CM

## 2024-04-22 DIAGNOSIS — D509 Iron deficiency anemia, unspecified: Secondary | ICD-10-CM | POA: Insufficient documentation

## 2024-04-22 NOTE — Telephone Encounter (Signed)
 Patient referred to infusion pharmacy team for ambulatory infusion of IV iron.  Insurance - Medicare Part A/B Site of care - Site of care: [facility] Dx code - D50.9 IV Iron Therapy - Monoferric  1000 mg x 1 Infusion appointments - Scheduling team will schedule patient as soon as possible.    Thank you,  Norton Blush, PharmD, [facility] Pharmacist [facility]

## 2024-04-22 NOTE — Telephone Encounter (Signed)
 Auth Submission: NO AUTH NEEDED Site of care: [facility] Payer: MEDICARE A/B, BCBS SUPP Medication & CPT/J Code(s) submitted: Monoferric  (Ferrci derisomaltose) [phone] Diagnosis Code: D50.9 Route of submission (phone, fax, portal):  Phone # Fax # Auth type: Buy/Bill HB Units/visits requested: 1000MG  X 1 DOSE Reference number:  Approval from: 04/22/2024 to 07/11/24   Medicare A/B will cover 80%, BCBS Supp will cover remaining 20%. Med will be covered at 100%.   Ezmeralda Stefanick, CPhT [facility] Phone: [phone] 04/22/2024

## 2024-04-29 ENCOUNTER — Inpatient Hospital Stay (HOSPITAL_COMMUNITY): Admission: RE | Admit: 2024-04-29 | Discharge: 2024-04-29 | Attending: Internal Medicine

## 2024-04-29 VITALS — BP 105/79 | HR 69 | Temp 97.0°F | Resp 17

## 2024-04-29 DIAGNOSIS — Z95811 Presence of heart assist device: Secondary | ICD-10-CM

## 2024-04-29 DIAGNOSIS — D509 Iron deficiency anemia, unspecified: Secondary | ICD-10-CM | POA: Diagnosis present

## 2024-04-29 DIAGNOSIS — Z7901 Long term (current) use of anticoagulants: Secondary | ICD-10-CM

## 2024-04-29 LAB — POCT INR: INR: 3.3 — AB (ref 2.0–3.0)

## 2024-04-29 MED ORDER — EPLERENONE 25 MG PO TABS: 50.0000 mg | ORAL_TABLET | Freq: Every morning | ORAL | 0 refills | Status: DC

## 2024-04-29 MED ORDER — SODIUM CHLORIDE 0.9 % IV SOLN
1000.0000 mg | Freq: Once | INTRAVENOUS | Status: AC
  Administered 2024-04-29: 10:00:00 1000 mg via INTRAVENOUS
  Filled 2024-04-29: qty 10

## 2024-05-07 ENCOUNTER — Ambulatory Visit (HOSPITAL_COMMUNITY): Payer: Self-pay | Admitting: Pharmacist

## 2024-05-07 DIAGNOSIS — Z95811 Presence of heart assist device: Secondary | ICD-10-CM

## 2024-05-07 DIAGNOSIS — Z7901 Long term (current) use of anticoagulants: Secondary | ICD-10-CM

## 2024-05-07 LAB — POCT INR: INR: 2.6 (ref 2.0–3.0)

## 2024-05-14 ENCOUNTER — Ambulatory Visit (HOSPITAL_COMMUNITY): Payer: Self-pay | Admitting: Pharmacist

## 2024-05-14 DIAGNOSIS — Z95811 Presence of heart assist device: Secondary | ICD-10-CM

## 2024-05-14 DIAGNOSIS — Z7901 Long term (current) use of anticoagulants: Secondary | ICD-10-CM

## 2024-05-14 LAB — POCT INR: INR: 2.6 (ref 2.0–3.0)

## 2024-05-17 ENCOUNTER — Telehealth: Payer: Self-pay | Admitting: *Deleted

## 2024-05-17 NOTE — Telephone Encounter (Signed)
 CALLED PATIENT TO INFORM OF CT FOR 07-21-24- ARRIVAL TIME- 12:15 PM @ [facility] NO RESTRICTIONS TO SCAN, PATIENT TO RECEIVE RESULTS FROM DR. KINARD ON 07/26/24 @ 10:30 AM , LVM FOR A RETURN CALL

## 2024-05-20 ENCOUNTER — Ambulatory Visit (HOSPITAL_COMMUNITY): Payer: Self-pay | Admitting: Pharmacist

## 2024-05-20 LAB — POCT INR: INR: 2.8 (ref 2.0–3.0)

## 2024-05-21 ENCOUNTER — Other Ambulatory Visit (HOSPITAL_COMMUNITY): Payer: Self-pay | Admitting: Internal Medicine

## 2024-05-21 DIAGNOSIS — Z95811 Presence of heart assist device: Secondary | ICD-10-CM

## 2024-05-21 DIAGNOSIS — I5022 Chronic systolic (congestive) heart failure: Secondary | ICD-10-CM

## 2024-05-22 ENCOUNTER — Other Ambulatory Visit (HOSPITAL_COMMUNITY): Payer: Self-pay | Admitting: Internal Medicine

## 2024-05-22 DIAGNOSIS — F32A Depression, unspecified: Secondary | ICD-10-CM

## 2024-05-22 DIAGNOSIS — Z95811 Presence of heart assist device: Secondary | ICD-10-CM

## 2024-05-22 DIAGNOSIS — I5022 Chronic systolic (congestive) heart failure: Secondary | ICD-10-CM

## 2024-05-27 ENCOUNTER — Ambulatory Visit (HOSPITAL_COMMUNITY): Payer: Self-pay | Admitting: Pharmacist

## 2024-05-27 LAB — POCT INR: INR: 2.4 (ref 2.0–3.0)

## 2024-06-03 ENCOUNTER — Ambulatory Visit (HOSPITAL_COMMUNITY): Payer: Self-pay | Admitting: Pharmacist

## 2024-06-03 ENCOUNTER — Telehealth (HOSPITAL_COMMUNITY): Payer: Self-pay | Admitting: Unknown Physician Specialty

## 2024-06-03 LAB — POCT INR: INR: 2.8 (ref 2.0–3.0)

## 2024-06-03 NOTE — Telephone Encounter (Signed)

## 2024-06-09 ENCOUNTER — Ambulatory Visit (HOSPITAL_COMMUNITY)
Admission: RE | Admit: 2024-06-09 | Discharge: 2024-06-09 | Disposition: A | Discharge status: Home / Self Care | Source: Ambulatory Visit | Attending: Internal Medicine | Admitting: Internal Medicine

## 2024-06-09 DIAGNOSIS — Z4509 Encounter for adjustment and management of other cardiac device: Secondary | ICD-10-CM | POA: Insufficient documentation

## 2024-06-09 DIAGNOSIS — Z95811 Presence of heart assist device: Secondary | ICD-10-CM | POA: Insufficient documentation

## 2024-06-09 NOTE — Progress Notes (Signed)
 Pt presents to clinic today for dressing change. Denies concerns with VAD equipment or drive line.   Exit site care: Existing VAD dressing removed and site care performed using sterile technique. Drive line exit site cleaned with Chlora prep applicators x 2, allowed to dry, and Sorbaview dressing with Silverlon patch applied. Exit site healed and incorporated, the velour is fully implanted at exit site, hub is partially exposed. No redness, tenderness, drainage, foul odor or rash noted. Drive line anchor re-applied. Pt denies fever or chills.   Plan: Pt to let coordinators know if caregiver Aldona is unable to do dressing next week to schedule dressing appt  Isaiah Knoll RN VAD Coordinator  Office: [phone]  24/7 Pager: [phone]

## 2024-06-10 ENCOUNTER — Ambulatory Visit (HOSPITAL_COMMUNITY): Payer: Self-pay | Admitting: Pharmacist

## 2024-06-10 LAB — POCT INR: INR: 2.7 (ref 2.0–3.0)

## 2024-06-17 ENCOUNTER — Ambulatory Visit (HOSPITAL_COMMUNITY): Payer: Self-pay

## 2024-06-17 LAB — POCT INR: INR: 2.6 (ref 2.0–3.0)

## 2024-06-21 ENCOUNTER — Ambulatory Visit (HOSPITAL_COMMUNITY)

## 2024-06-28 ENCOUNTER — Ambulatory Visit (HOSPITAL_COMMUNITY): Admitting: Internal Medicine

## 2024-07-20 ENCOUNTER — Ambulatory Visit: Admitting: Student

## 2024-07-21 ENCOUNTER — Ambulatory Visit (HOSPITAL_COMMUNITY)

## 2024-07-26 ENCOUNTER — Ambulatory Visit: Admitting: Radiation Oncology

## 2024-08-02 ENCOUNTER — Encounter (INDEPENDENT_AMBULATORY_CARE_PROVIDER_SITE_OTHER): Admitting: Ophthalmology

## 2024-08-13 ENCOUNTER — Ambulatory Visit
